# Patient Record
Sex: Female | Born: 1945
Health system: Southern US, Community
[De-identification: ages and names within clinical notes are randomized; demographics above are authoritative.]

## PROBLEM LIST (undated history)

## (undated) DIAGNOSIS — I2510 Atherosclerotic heart disease of native coronary artery without angina pectoris: Secondary | ICD-10-CM

## (undated) DIAGNOSIS — M109 Gout, unspecified: Secondary | ICD-10-CM

## (undated) DIAGNOSIS — M5137 Other intervertebral disc degeneration, lumbosacral region: Secondary | ICD-10-CM

## (undated) DIAGNOSIS — I10 Essential (primary) hypertension: Secondary | ICD-10-CM

## (undated) DIAGNOSIS — I714 Abdominal aortic aneurysm, without rupture, unspecified: Secondary | ICD-10-CM

## (undated) DIAGNOSIS — M51379 Other intervertebral disc degeneration, lumbosacral region without mention of lumbar back pain or lower extremity pain: Secondary | ICD-10-CM

## (undated) DIAGNOSIS — F3289 Other specified depressive episodes: Secondary | ICD-10-CM

## (undated) DIAGNOSIS — Z923 Personal history of irradiation: Secondary | ICD-10-CM

## (undated) DIAGNOSIS — J309 Allergic rhinitis, unspecified: Secondary | ICD-10-CM

## (undated) DIAGNOSIS — E669 Obesity, unspecified: Secondary | ICD-10-CM

## (undated) DIAGNOSIS — G8929 Other chronic pain: Secondary | ICD-10-CM

## (undated) DIAGNOSIS — E039 Hypothyroidism, unspecified: Secondary | ICD-10-CM

## (undated) DIAGNOSIS — K922 Gastrointestinal hemorrhage, unspecified: Secondary | ICD-10-CM

## (undated) DIAGNOSIS — M545 Low back pain, unspecified: Secondary | ICD-10-CM

## (undated) DIAGNOSIS — F329 Major depressive disorder, single episode, unspecified: Secondary | ICD-10-CM

## (undated) DIAGNOSIS — E785 Hyperlipidemia, unspecified: Secondary | ICD-10-CM

## (undated) DIAGNOSIS — F411 Generalized anxiety disorder: Secondary | ICD-10-CM

## (undated) DIAGNOSIS — Z9114 Patient's other noncompliance with medication regimen: Secondary | ICD-10-CM

## (undated) HISTORY — DX: Gout, unspecified: M10.9

## (undated) HISTORY — DX: Other intervertebral disc degeneration, lumbosacral region: M51.37

## (undated) HISTORY — DX: Obesity, unspecified: E66.9

## (undated) HISTORY — DX: Essential (primary) hypertension: I10

## (undated) HISTORY — DX: Other intervertebral disc degeneration, lumbosacral region without mention of lumbar back pain or lower extremity pain: M51.379

## (undated) HISTORY — PX: TOTAL ABDOMINAL HYSTERECTOMY: SHX209

## (undated) HISTORY — DX: Allergic rhinitis, unspecified: J30.9

## (undated) HISTORY — DX: Atherosclerotic heart disease of native coronary artery without angina pectoris: I25.10

## (undated) HISTORY — PX: SPINE SURGERY: SHX786

## (undated) HISTORY — DX: Generalized anxiety disorder: F41.1

## (undated) HISTORY — DX: Low back pain: M54.5

## (undated) HISTORY — PX: OTHER SURGICAL HISTORY: SHX169

## (undated) HISTORY — DX: Low back pain, unspecified: M54.50

## (undated) HISTORY — DX: Other specified depressive episodes: F32.89

## (undated) HISTORY — DX: Major depressive disorder, single episode, unspecified: F32.9

## (undated) HISTORY — PX: PARATHYROIDECTOMY: SHX19

## (undated) HISTORY — PX: OOPHORECTOMY: SHX86

## (undated) HISTORY — DX: Gastrointestinal hemorrhage, unspecified: K92.2

## (undated) HISTORY — DX: Other chronic pain: G89.29

## (undated) HISTORY — PX: THYROIDECTOMY: SHX17

## (undated) HISTORY — DX: Hyperlipidemia, unspecified: E78.5

## (undated) HISTORY — PX: CARDIAC CATHETERIZATION: SHX172

---

## 1998-04-23 ENCOUNTER — Encounter: Payer: Self-pay | Admitting: Cardiology

## 1998-04-23 ENCOUNTER — Observation Stay (HOSPITAL_COMMUNITY): Admission: AD | Admit: 1998-04-23 | Discharge: 1998-04-24 | Payer: Self-pay | Admitting: Cardiology

## 1999-11-09 ENCOUNTER — Other Ambulatory Visit: Admission: RE | Admit: 1999-11-09 | Discharge: 1999-11-09 | Payer: Self-pay | Admitting: Gynecology

## 2001-05-25 ENCOUNTER — Emergency Department (HOSPITAL_COMMUNITY): Admission: EM | Admit: 2001-05-25 | Discharge: 2001-05-25 | Payer: Self-pay | Admitting: Emergency Medicine

## 2001-06-08 ENCOUNTER — Emergency Department (HOSPITAL_COMMUNITY): Admission: EM | Admit: 2001-06-08 | Discharge: 2001-06-08 | Payer: Self-pay | Admitting: Emergency Medicine

## 2001-06-11 ENCOUNTER — Inpatient Hospital Stay (HOSPITAL_COMMUNITY): Admission: EM | Admit: 2001-06-11 | Discharge: 2001-06-15 | Payer: Self-pay | Admitting: *Deleted

## 2001-06-26 ENCOUNTER — Encounter (HOSPITAL_COMMUNITY): Admission: RE | Admit: 2001-06-26 | Discharge: 2001-09-24 | Payer: Self-pay | Admitting: Cardiology

## 2001-08-23 ENCOUNTER — Other Ambulatory Visit: Admission: RE | Admit: 2001-08-23 | Discharge: 2001-08-23 | Payer: Self-pay | Admitting: Obstetrics and Gynecology

## 2002-06-15 ENCOUNTER — Emergency Department (HOSPITAL_COMMUNITY): Admission: EM | Admit: 2002-06-15 | Discharge: 2002-06-15 | Payer: Self-pay | Admitting: Emergency Medicine

## 2002-07-10 ENCOUNTER — Encounter: Payer: Self-pay | Admitting: Internal Medicine

## 2002-07-10 ENCOUNTER — Ambulatory Visit (HOSPITAL_COMMUNITY): Admission: RE | Admit: 2002-07-10 | Discharge: 2002-07-10 | Payer: Self-pay | Admitting: Internal Medicine

## 2002-12-11 ENCOUNTER — Emergency Department (HOSPITAL_COMMUNITY): Admission: EM | Admit: 2002-12-11 | Discharge: 2002-12-12 | Payer: Self-pay | Admitting: Emergency Medicine

## 2003-06-26 ENCOUNTER — Ambulatory Visit (HOSPITAL_COMMUNITY): Admission: RE | Admit: 2003-06-26 | Discharge: 2003-06-26 | Payer: Self-pay | Admitting: Cardiology

## 2003-12-15 ENCOUNTER — Ambulatory Visit: Payer: Self-pay | Admitting: Internal Medicine

## 2003-12-25 ENCOUNTER — Ambulatory Visit: Payer: Self-pay | Admitting: Internal Medicine

## 2004-02-07 ENCOUNTER — Inpatient Hospital Stay (HOSPITAL_COMMUNITY): Admission: EM | Admit: 2004-02-07 | Discharge: 2004-02-11 | Payer: Self-pay | Admitting: Emergency Medicine

## 2004-02-07 ENCOUNTER — Ambulatory Visit: Payer: Self-pay | Admitting: Cardiology

## 2004-02-16 ENCOUNTER — Ambulatory Visit: Payer: Self-pay | Admitting: Cardiology

## 2004-03-22 ENCOUNTER — Encounter (HOSPITAL_COMMUNITY): Admission: RE | Admit: 2004-03-22 | Discharge: 2004-05-17 | Payer: Self-pay | Admitting: Cardiology

## 2004-03-30 ENCOUNTER — Ambulatory Visit: Payer: Self-pay | Admitting: Internal Medicine

## 2004-03-31 ENCOUNTER — Ambulatory Visit: Payer: Self-pay | Admitting: Internal Medicine

## 2004-04-16 ENCOUNTER — Ambulatory Visit: Payer: Self-pay | Admitting: Cardiology

## 2004-04-16 ENCOUNTER — Ambulatory Visit (HOSPITAL_COMMUNITY): Admission: RE | Admit: 2004-04-16 | Discharge: 2004-04-16 | Payer: Self-pay | Admitting: Internal Medicine

## 2004-04-22 ENCOUNTER — Ambulatory Visit: Payer: Self-pay | Admitting: Internal Medicine

## 2004-05-14 ENCOUNTER — Ambulatory Visit: Payer: Self-pay | Admitting: Internal Medicine

## 2004-06-29 ENCOUNTER — Ambulatory Visit: Payer: Self-pay | Admitting: Internal Medicine

## 2004-07-16 ENCOUNTER — Ambulatory Visit: Payer: Self-pay | Admitting: Cardiology

## 2004-07-19 ENCOUNTER — Encounter: Payer: Self-pay | Admitting: Internal Medicine

## 2004-07-24 ENCOUNTER — Ambulatory Visit: Payer: Self-pay | Admitting: Family Medicine

## 2004-08-09 ENCOUNTER — Ambulatory Visit: Payer: Self-pay | Admitting: Internal Medicine

## 2004-08-17 ENCOUNTER — Encounter: Admission: RE | Admit: 2004-08-17 | Discharge: 2004-08-17 | Payer: Self-pay | Admitting: Cardiology

## 2004-09-28 ENCOUNTER — Ambulatory Visit: Payer: Self-pay | Admitting: Internal Medicine

## 2004-10-15 ENCOUNTER — Ambulatory Visit: Payer: Self-pay | Admitting: Cardiology

## 2004-10-21 ENCOUNTER — Ambulatory Visit: Payer: Self-pay | Admitting: Internal Medicine

## 2004-10-22 ENCOUNTER — Other Ambulatory Visit: Admission: RE | Admit: 2004-10-22 | Discharge: 2004-10-22 | Payer: Self-pay | Admitting: Gynecology

## 2004-11-23 ENCOUNTER — Ambulatory Visit: Payer: Self-pay | Admitting: Internal Medicine

## 2004-11-29 ENCOUNTER — Ambulatory Visit (HOSPITAL_COMMUNITY): Admission: RE | Admit: 2004-11-29 | Discharge: 2004-11-29 | Payer: Self-pay | Admitting: Otolaryngology

## 2004-12-21 ENCOUNTER — Ambulatory Visit: Payer: Self-pay | Admitting: Internal Medicine

## 2004-12-25 ENCOUNTER — Emergency Department (HOSPITAL_COMMUNITY): Admission: EM | Admit: 2004-12-25 | Discharge: 2004-12-25 | Payer: Self-pay | Admitting: Emergency Medicine

## 2005-01-03 ENCOUNTER — Encounter: Admission: RE | Admit: 2005-01-03 | Discharge: 2005-01-03 | Payer: Self-pay | Admitting: Dentistry

## 2005-02-22 ENCOUNTER — Encounter: Admission: RE | Admit: 2005-02-22 | Discharge: 2005-05-23 | Payer: Self-pay | Admitting: Cardiology

## 2005-03-23 ENCOUNTER — Ambulatory Visit: Payer: Self-pay | Admitting: Cardiology

## 2005-03-25 ENCOUNTER — Ambulatory Visit: Payer: Self-pay | Admitting: Cardiology

## 2005-03-29 ENCOUNTER — Ambulatory Visit: Payer: Self-pay | Admitting: Internal Medicine

## 2005-05-06 ENCOUNTER — Ambulatory Visit: Payer: Self-pay | Admitting: Internal Medicine

## 2005-05-25 ENCOUNTER — Ambulatory Visit: Payer: Self-pay | Admitting: Internal Medicine

## 2005-07-19 ENCOUNTER — Ambulatory Visit: Payer: Self-pay | Admitting: Internal Medicine

## 2005-07-19 ENCOUNTER — Ambulatory Visit: Payer: Self-pay | Admitting: Cardiology

## 2005-08-22 ENCOUNTER — Ambulatory Visit: Payer: Self-pay | Admitting: Internal Medicine

## 2005-10-04 ENCOUNTER — Ambulatory Visit: Payer: Self-pay | Admitting: Internal Medicine

## 2005-10-30 ENCOUNTER — Emergency Department (HOSPITAL_COMMUNITY): Admission: EM | Admit: 2005-10-30 | Discharge: 2005-10-30 | Payer: Self-pay | Admitting: Emergency Medicine

## 2005-10-31 ENCOUNTER — Ambulatory Visit: Payer: Self-pay | Admitting: Internal Medicine

## 2005-11-10 ENCOUNTER — Ambulatory Visit: Payer: Self-pay | Admitting: Cardiology

## 2005-11-15 ENCOUNTER — Ambulatory Visit: Payer: Self-pay

## 2005-11-23 ENCOUNTER — Ambulatory Visit: Payer: Self-pay | Admitting: Internal Medicine

## 2005-11-28 ENCOUNTER — Ambulatory Visit: Payer: Self-pay | Admitting: Cardiology

## 2005-12-01 ENCOUNTER — Ambulatory Visit: Payer: Self-pay | Admitting: Cardiology

## 2005-12-01 ENCOUNTER — Ambulatory Visit (HOSPITAL_COMMUNITY): Admission: RE | Admit: 2005-12-01 | Discharge: 2005-12-02 | Payer: Self-pay | Admitting: Cardiology

## 2005-12-07 ENCOUNTER — Ambulatory Visit: Payer: Self-pay | Admitting: Internal Medicine

## 2005-12-07 LAB — CONVERTED CEMR LAB
ALT: 43 units/L — ABNORMAL HIGH (ref 0–40)
AST: 54 units/L — ABNORMAL HIGH (ref 0–37)
Albumin: 3.9 g/dL (ref 3.5–5.2)
Alkaline Phosphatase: 95 units/L (ref 39–117)
BUN: 14 mg/dL (ref 6–23)
Basophils Absolute: 0 10*3/uL (ref 0.0–0.1)
CO2: 25 meq/L (ref 19–32)
Chloride: 106 meq/L (ref 96–112)
Chol/HDL Ratio, serum: 3.8
Cholesterol: 126 mg/dL (ref 0–200)
Creatinine, Ser: 0.9 mg/dL (ref 0.4–1.2)
Crystals: NEGATIVE
Eosinophil percent: 4.4 % (ref 0.0–5.0)
GFR calc non Af Amer: 68 mL/min
Glucose, Bld: 213 mg/dL — ABNORMAL HIGH (ref 70–99)
HCT: 46.5 % — ABNORMAL HIGH (ref 36.0–46.0)
Hemoglobin: 15.2 g/dL — ABNORMAL HIGH (ref 12.0–15.0)
Hgb A1c MFr Bld: 7.7 % — ABNORMAL HIGH (ref 4.6–6.0)
MCHC: 32.7 g/dL (ref 30.0–36.0)
MCV: 85.3 fL (ref 78.0–100.0)
Monocytes Absolute: 0.4 10*3/uL (ref 0.2–0.7)
Monocytes Relative: 7 % (ref 3.0–11.0)
Mucus, UA: NEGATIVE
Neutro Abs: 3.5 10*3/uL (ref 1.4–7.7)
Neutrophils Relative %: 53.8 % (ref 43.0–77.0)
Potassium: 4.2 meq/L (ref 3.5–5.1)
RBC: 5.45 M/uL — ABNORMAL HIGH (ref 3.87–5.11)
RDW: 12.8 % (ref 11.5–14.6)
Sodium: 140 meq/L (ref 135–145)
Specific Gravity, Urine: 1.025 (ref 1.000–1.03)
TSH: 1.64 microintl units/mL (ref 0.35–5.50)
Total Bilirubin: 0.9 mg/dL (ref 0.3–1.2)
Total Protein: 7.5 g/dL (ref 6.0–8.3)
Triglyceride fasting, serum: 126 mg/dL (ref 0–149)
Urine Glucose: NEGATIVE mg/dL
Urobilinogen, UA: 0.2 (ref 0.0–1.0)
VLDL: 25 mg/dL (ref 0–40)
pH: 5.5 (ref 5.0–8.0)

## 2005-12-16 ENCOUNTER — Ambulatory Visit: Payer: Self-pay | Admitting: Cardiology

## 2005-12-27 ENCOUNTER — Ambulatory Visit: Payer: Self-pay | Admitting: Internal Medicine

## 2006-01-02 ENCOUNTER — Ambulatory Visit: Payer: Self-pay | Admitting: Internal Medicine

## 2006-01-02 ENCOUNTER — Ambulatory Visit: Payer: Self-pay | Admitting: Gastroenterology

## 2006-01-02 LAB — CONVERTED CEMR LAB
ALT: 37 units/L (ref 0–40)
AST: 39 units/L — ABNORMAL HIGH (ref 0–37)
Albumin: 3.8 g/dL (ref 3.5–5.2)
Alkaline Phosphatase: 89 units/L (ref 39–117)
BUN: 8 mg/dL (ref 6–23)
Basophils Absolute: 0.1 10*3/uL (ref 0.0–0.1)
Basophils Relative: 2 % — ABNORMAL HIGH (ref 0.0–1.0)
Bilirubin Urine: NEGATIVE
CO2: 26 meq/L (ref 19–32)
Chloride: 106 meq/L (ref 96–112)
Chol/HDL Ratio, serum: 3.3
Creatinine, Ser: 0.9 mg/dL (ref 0.4–1.2)
Eosinophil percent: 2.8 % (ref 0.0–5.0)
GFR calc non Af Amer: 68 mL/min
HCT: 45.9 % (ref 36.0–46.0)
LDL Cholesterol: 66 mg/dL (ref 0–99)
Leukocytes, UA: NEGATIVE
Lymphocytes Relative: 35.7 % (ref 12.0–46.0)
MCHC: 32.4 g/dL (ref 30.0–36.0)
MCV: 86.7 fL (ref 78.0–100.0)
Monocytes Absolute: 0.4 10*3/uL (ref 0.2–0.7)
Neutro Abs: 3.4 10*3/uL (ref 1.4–7.7)
Neutrophils Relative %: 53.9 % (ref 43.0–77.0)
Nitrite: NEGATIVE
Platelets: 354 10*3/uL (ref 150–400)
Potassium: 4 meq/L (ref 3.5–5.1)
RBC: 5.3 M/uL — ABNORMAL HIGH (ref 3.87–5.11)
RDW: 12.9 % (ref 11.5–14.6)
Sodium: 141 meq/L (ref 135–145)
Specific Gravity, Urine: 1.025 (ref 1.000–1.03)
Total Bilirubin: 0.8 mg/dL (ref 0.3–1.2)
Total Protein, Urine: NEGATIVE mg/dL
Total Protein: 7.7 g/dL (ref 6.0–8.3)
Triglyceride fasting, serum: 102 mg/dL (ref 0–149)
Urobilinogen, UA: 0.2 (ref 0.0–1.0)
WBC: 6.3 10*3/uL (ref 4.5–10.5)
pH: 5.5 (ref 5.0–8.0)

## 2006-03-24 ENCOUNTER — Ambulatory Visit: Payer: Self-pay | Admitting: Internal Medicine

## 2006-04-17 ENCOUNTER — Ambulatory Visit: Payer: Self-pay | Admitting: Internal Medicine

## 2006-05-03 ENCOUNTER — Ambulatory Visit: Payer: Self-pay | Admitting: Gastroenterology

## 2006-05-10 ENCOUNTER — Ambulatory Visit: Payer: Self-pay | Admitting: Cardiology

## 2006-05-19 ENCOUNTER — Ambulatory Visit (HOSPITAL_COMMUNITY): Admission: RE | Admit: 2006-05-19 | Discharge: 2006-05-19 | Payer: Self-pay | Admitting: Gastroenterology

## 2006-05-19 LAB — HM COLONOSCOPY

## 2006-05-30 ENCOUNTER — Ambulatory Visit: Payer: Self-pay | Admitting: Gastroenterology

## 2006-06-22 ENCOUNTER — Ambulatory Visit: Payer: Self-pay | Admitting: Internal Medicine

## 2006-06-22 LAB — CONVERTED CEMR LAB
BUN: 9 mg/dL (ref 6–23)
CO2: 27 meq/L (ref 19–32)
Calcium: 9.2 mg/dL (ref 8.4–10.5)
Chloride: 106 meq/L (ref 96–112)
Cholesterol: 161 mg/dL (ref 0–200)
Creatinine, Ser: 0.9 mg/dL (ref 0.4–1.2)
GFR calc Af Amer: 82 mL/min
GFR calc non Af Amer: 68 mL/min
Glucose, Bld: 116 mg/dL — ABNORMAL HIGH (ref 70–99)
HDL: 31.9 mg/dL — ABNORMAL LOW (ref >39.0)
Hgb A1c MFr Bld: 8.1 % — ABNORMAL HIGH (ref 4.6–6.0)
LDL Cholesterol: 98 mg/dL (ref 0–99)
Potassium: 3.9 meq/L (ref 3.5–5.1)
Sodium: 142 meq/L (ref 135–145)
Total CHOL/HDL Ratio: 5
Triglycerides: 157 mg/dL — ABNORMAL HIGH (ref 0–149)
VLDL: 31 mg/dL (ref 0–40)

## 2006-06-30 ENCOUNTER — Ambulatory Visit: Payer: Self-pay | Admitting: Internal Medicine

## 2006-07-31 ENCOUNTER — Encounter: Payer: Self-pay | Admitting: Internal Medicine

## 2006-07-31 DIAGNOSIS — I2510 Atherosclerotic heart disease of native coronary artery without angina pectoris: Secondary | ICD-10-CM

## 2006-07-31 DIAGNOSIS — I10 Essential (primary) hypertension: Secondary | ICD-10-CM | POA: Insufficient documentation

## 2006-07-31 DIAGNOSIS — E782 Mixed hyperlipidemia: Secondary | ICD-10-CM | POA: Insufficient documentation

## 2006-07-31 DIAGNOSIS — E785 Hyperlipidemia, unspecified: Secondary | ICD-10-CM | POA: Insufficient documentation

## 2006-07-31 DIAGNOSIS — E039 Hypothyroidism, unspecified: Secondary | ICD-10-CM | POA: Insufficient documentation

## 2006-07-31 HISTORY — DX: Atherosclerotic heart disease of native coronary artery without angina pectoris: I25.10

## 2006-07-31 HISTORY — DX: Hyperlipidemia, unspecified: E78.5

## 2006-08-28 ENCOUNTER — Ambulatory Visit: Payer: Self-pay | Admitting: Cardiology

## 2006-09-20 ENCOUNTER — Ambulatory Visit: Payer: Self-pay | Admitting: Internal Medicine

## 2006-10-14 ENCOUNTER — Ambulatory Visit: Payer: Self-pay | Admitting: Family Medicine

## 2006-10-18 ENCOUNTER — Ambulatory Visit: Payer: Self-pay | Admitting: Internal Medicine

## 2006-10-18 LAB — CONVERTED CEMR LAB: Hgb A1c MFr Bld: 7 % — ABNORMAL HIGH (ref 4.6–6.0)

## 2006-10-23 ENCOUNTER — Ambulatory Visit: Payer: Self-pay | Admitting: Cardiology

## 2006-10-24 ENCOUNTER — Ambulatory Visit: Payer: Self-pay | Admitting: Cardiology

## 2006-10-24 LAB — CONVERTED CEMR LAB
ALT: 34 units/L (ref 0–35)
AST: 27 units/L (ref 0–37)
Bilirubin, Direct: 0.1 mg/dL (ref 0.0–0.3)
Cholesterol: 111 mg/dL (ref 0–200)
HDL: 31.6 mg/dL — ABNORMAL LOW (ref >39.0)
LDL Cholesterol: 66 mg/dL (ref 0–99)
Total Bilirubin: 0.5 mg/dL (ref 0.3–1.2)
Total CHOL/HDL Ratio: 3.5
Total Protein: 7.4 g/dL (ref 6.0–8.3)
Triglycerides: 66 mg/dL (ref 0–149)
VLDL: 13 mg/dL (ref 0–40)

## 2006-11-03 ENCOUNTER — Ambulatory Visit: Payer: Self-pay | Admitting: Internal Medicine

## 2006-11-03 ENCOUNTER — Encounter: Payer: Self-pay | Admitting: Internal Medicine

## 2006-11-03 DIAGNOSIS — J309 Allergic rhinitis, unspecified: Secondary | ICD-10-CM | POA: Insufficient documentation

## 2006-11-03 DIAGNOSIS — E119 Type 2 diabetes mellitus without complications: Secondary | ICD-10-CM

## 2006-11-03 HISTORY — DX: Type 2 diabetes mellitus without complications: E11.9

## 2006-11-04 ENCOUNTER — Emergency Department (HOSPITAL_COMMUNITY): Admission: EM | Admit: 2006-11-04 | Discharge: 2006-11-04 | Payer: Self-pay | Admitting: Emergency Medicine

## 2007-01-18 HISTORY — PX: LUMBAR FUSION: SHX111

## 2007-02-07 ENCOUNTER — Ambulatory Visit: Payer: Self-pay | Admitting: Internal Medicine

## 2007-02-07 DIAGNOSIS — M5137 Other intervertebral disc degeneration, lumbosacral region: Secondary | ICD-10-CM | POA: Insufficient documentation

## 2007-02-07 DIAGNOSIS — K5900 Constipation, unspecified: Secondary | ICD-10-CM | POA: Insufficient documentation

## 2007-02-07 DIAGNOSIS — R300 Dysuria: Secondary | ICD-10-CM | POA: Insufficient documentation

## 2007-02-07 DIAGNOSIS — R1013 Epigastric pain: Secondary | ICD-10-CM

## 2007-02-07 DIAGNOSIS — M51379 Other intervertebral disc degeneration, lumbosacral region without mention of lumbar back pain or lower extremity pain: Secondary | ICD-10-CM | POA: Insufficient documentation

## 2007-02-07 DIAGNOSIS — K3189 Other diseases of stomach and duodenum: Secondary | ICD-10-CM | POA: Insufficient documentation

## 2007-02-07 LAB — CONVERTED CEMR LAB
Crystals: NEGATIVE
Hemoglobin, Urine: NEGATIVE
Mucus, UA: NEGATIVE
Nitrite: NEGATIVE
Specific Gravity, Urine: 1.02 (ref 1.000–1.03)
Total Protein, Urine: NEGATIVE mg/dL
Urine Glucose: NEGATIVE mg/dL
pH: 6 (ref 5.0–8.0)

## 2007-04-23 ENCOUNTER — Ambulatory Visit: Payer: Self-pay | Admitting: Internal Medicine

## 2007-04-23 DIAGNOSIS — H6691 Otitis media, unspecified, right ear: Secondary | ICD-10-CM | POA: Insufficient documentation

## 2007-04-23 DIAGNOSIS — H6520 Chronic serous otitis media, unspecified ear: Secondary | ICD-10-CM | POA: Insufficient documentation

## 2007-04-23 HISTORY — DX: Chronic serous otitis media, unspecified ear: H65.20

## 2007-04-23 LAB — CONVERTED CEMR LAB
ALT: 37 units/L — ABNORMAL HIGH (ref 0–35)
AST: 34 units/L (ref 0–37)
Albumin: 4.1 g/dL (ref 3.5–5.2)
Alkaline Phosphatase: 92 units/L (ref 39–117)
BUN: 6 mg/dL (ref 6–23)
Bilirubin, Direct: 0.1 mg/dL (ref 0.0–0.3)
CO2: 29 meq/L (ref 19–32)
Chloride: 101 meq/L (ref 96–112)
Cholesterol: 119 mg/dL (ref 0–200)
Creatinine, Ser: 0.7 mg/dL (ref 0.4–1.2)
GFR calc Af Amer: 109 mL/min
GFR calc non Af Amer: 90 mL/min
Hgb A1c MFr Bld: 6.6 % — ABNORMAL HIGH (ref 4.6–6.0)
LDL Cholesterol: 60 mg/dL (ref 0–99)
Potassium: 3.8 meq/L (ref 3.5–5.1)
Sodium: 139 meq/L (ref 135–145)
Total Bilirubin: 0.8 mg/dL (ref 0.3–1.2)
Triglycerides: 146 mg/dL (ref 0–149)
VLDL: 29 mg/dL (ref 0–40)

## 2007-04-25 ENCOUNTER — Encounter: Payer: Self-pay | Admitting: Internal Medicine

## 2007-05-22 ENCOUNTER — Ambulatory Visit: Payer: Self-pay | Admitting: Cardiology

## 2007-06-22 ENCOUNTER — Ambulatory Visit: Payer: Self-pay | Admitting: Internal Medicine

## 2007-06-22 DIAGNOSIS — F329 Major depressive disorder, single episode, unspecified: Secondary | ICD-10-CM

## 2007-06-22 DIAGNOSIS — R350 Frequency of micturition: Secondary | ICD-10-CM | POA: Insufficient documentation

## 2007-06-22 DIAGNOSIS — F3289 Other specified depressive episodes: Secondary | ICD-10-CM | POA: Insufficient documentation

## 2007-06-22 DIAGNOSIS — F411 Generalized anxiety disorder: Secondary | ICD-10-CM | POA: Insufficient documentation

## 2007-06-22 LAB — CONVERTED CEMR LAB
BUN: 11 mg/dL (ref 6–23)
Bilirubin Urine: NEGATIVE
Blood in Urine, dipstick: NEGATIVE
CO2: 28 meq/L (ref 19–32)
Calcium: 9.1 mg/dL (ref 8.4–10.5)
Chloride: 99 meq/L (ref 96–112)
Cholesterol: 117 mg/dL (ref 0–200)
Creatinine, Ser: 0.8 mg/dL (ref 0.4–1.2)
GFR calc Af Amer: 94 mL/min
GFR calc non Af Amer: 78 mL/min
Glucose, Bld: 223 mg/dL — ABNORMAL HIGH (ref 70–99)
Glucose, Urine, Semiquant: NEGATIVE
HDL: 33.9 mg/dL — ABNORMAL LOW (ref >39.0)
Hgb A1c MFr Bld: 6.7 % — ABNORMAL HIGH (ref 4.6–6.0)
Ketones, urine, test strip: NEGATIVE
LDL Cholesterol: 60 mg/dL (ref 0–99)
Protein, U semiquant: NEGATIVE
Total CHOL/HDL Ratio: 3.5
Triglycerides: 114 mg/dL (ref 0–149)
Urobilinogen, UA: NEGATIVE
VLDL: 23 mg/dL (ref 0–40)
WBC Urine, dipstick: NEGATIVE
pH: 6.5

## 2007-08-02 ENCOUNTER — Ambulatory Visit: Payer: Self-pay | Admitting: Internal Medicine

## 2007-08-02 ENCOUNTER — Telehealth: Payer: Self-pay | Admitting: Internal Medicine

## 2007-09-28 ENCOUNTER — Telehealth (INDEPENDENT_AMBULATORY_CARE_PROVIDER_SITE_OTHER): Payer: Self-pay | Admitting: *Deleted

## 2007-09-28 ENCOUNTER — Ambulatory Visit: Payer: Self-pay | Admitting: Internal Medicine

## 2007-09-28 DIAGNOSIS — J209 Acute bronchitis, unspecified: Secondary | ICD-10-CM | POA: Insufficient documentation

## 2007-10-15 ENCOUNTER — Ambulatory Visit: Payer: Self-pay | Admitting: Internal Medicine

## 2007-10-26 ENCOUNTER — Ambulatory Visit: Payer: Self-pay | Admitting: Internal Medicine

## 2007-10-26 LAB — CONVERTED CEMR LAB
ALT: 36 units/L — ABNORMAL HIGH (ref 0–35)
AST: 37 units/L (ref 0–37)
Albumin: 3.8 g/dL (ref 3.5–5.2)
Alkaline Phosphatase: 93 units/L (ref 39–117)
BUN: 8 mg/dL (ref 6–23)
Basophils Absolute: 0 10*3/uL (ref 0.0–0.1)
Basophils Relative: 0.3 % (ref 0.0–3.0)
Bilirubin, Direct: 0.2 mg/dL (ref 0.0–0.3)
CO2: 27 meq/L (ref 19–32)
Calcium: 9 mg/dL (ref 8.4–10.5)
Chloride: 105 meq/L (ref 96–112)
Cholesterol: 124 mg/dL (ref 0–200)
Creatinine, Ser: 0.8 mg/dL (ref 0.4–1.2)
Creatinine,U: 194.9 mg/dL
Crystals: NEGATIVE
Eosinophils Absolute: 0.2 10*3/uL (ref 0.0–0.7)
Eosinophils Relative: 2.8 % (ref 0.0–5.0)
GFR calc Af Amer: 93 mL/min
GFR calc non Af Amer: 77 mL/min
Glucose, Bld: 146 mg/dL — ABNORMAL HIGH (ref 70–99)
HCT: 43.4 % (ref 36.0–46.0)
HDL: 32.6 mg/dL — ABNORMAL LOW (ref >39.0)
Hemoglobin: 14.9 g/dL (ref 12.0–15.0)
Hgb A1c MFr Bld: 6.8 % — ABNORMAL HIGH (ref 4.6–6.0)
Ketones, ur: NEGATIVE mg/dL
LDL Cholesterol: 68 mg/dL (ref 0–99)
Leukocytes, UA: NEGATIVE
Lymphocytes Relative: 38.1 % (ref 12.0–46.0)
MCHC: 34.2 g/dL (ref 30.0–36.0)
MCV: 85 fL (ref 78.0–100.0)
Microalb Creat Ratio: 11.8 mg/g (ref 0.0–30.0)
Microalb, Ur: 2.3 mg/dL — ABNORMAL HIGH (ref 0.0–1.9)
Monocytes Absolute: 0.6 10*3/uL (ref 0.1–1.0)
Monocytes Relative: 8 % (ref 3.0–12.0)
Mucus, UA: NEGATIVE
Neutro Abs: 3.6 10*3/uL (ref 1.4–7.7)
Neutrophils Relative %: 50.8 % (ref 43.0–77.0)
Nitrite: NEGATIVE
Platelets: 326 10*3/uL (ref 150–400)
Potassium: 4.3 meq/L (ref 3.5–5.1)
RBC: 5.11 M/uL (ref 3.87–5.11)
RDW: 13.1 % (ref 11.5–14.6)
Sodium: 141 meq/L (ref 135–145)
Specific Gravity, Urine: 1.02 (ref 1.000–1.03)
TSH: 1.57 microintl units/mL (ref 0.35–5.50)
Total Bilirubin: 0.6 mg/dL (ref 0.3–1.2)
Total CHOL/HDL Ratio: 3.8
Total Protein, Urine: NEGATIVE mg/dL
Total Protein: 7.7 g/dL (ref 6.0–8.3)
Triglycerides: 115 mg/dL (ref 0–149)
Urine Glucose: NEGATIVE mg/dL
Urobilinogen, UA: 0.2 (ref 0.0–1.0)
VLDL: 23 mg/dL (ref 0–40)
WBC: 7.1 10*3/uL (ref 4.5–10.5)
pH: 5.5 (ref 5.0–8.0)

## 2007-10-29 ENCOUNTER — Ambulatory Visit: Payer: Self-pay | Admitting: Internal Medicine

## 2007-10-29 DIAGNOSIS — N959 Unspecified menopausal and perimenopausal disorder: Secondary | ICD-10-CM

## 2007-10-29 HISTORY — DX: Unspecified menopausal and perimenopausal disorder: N95.9

## 2007-12-03 ENCOUNTER — Ambulatory Visit: Payer: Self-pay | Admitting: Internal Medicine

## 2007-12-06 ENCOUNTER — Telehealth (INDEPENDENT_AMBULATORY_CARE_PROVIDER_SITE_OTHER): Payer: Self-pay | Admitting: *Deleted

## 2007-12-12 ENCOUNTER — Ambulatory Visit: Payer: Self-pay | Admitting: Cardiology

## 2007-12-27 ENCOUNTER — Telehealth (INDEPENDENT_AMBULATORY_CARE_PROVIDER_SITE_OTHER): Payer: Self-pay | Admitting: *Deleted

## 2008-01-23 ENCOUNTER — Encounter: Payer: Self-pay | Admitting: Internal Medicine

## 2008-03-03 ENCOUNTER — Ambulatory Visit: Payer: Self-pay | Admitting: Internal Medicine

## 2008-06-25 ENCOUNTER — Telehealth (INDEPENDENT_AMBULATORY_CARE_PROVIDER_SITE_OTHER): Payer: Self-pay | Admitting: *Deleted

## 2008-06-30 ENCOUNTER — Ambulatory Visit: Payer: Self-pay | Admitting: Internal Medicine

## 2008-06-30 DIAGNOSIS — J019 Acute sinusitis, unspecified: Secondary | ICD-10-CM | POA: Insufficient documentation

## 2008-07-07 ENCOUNTER — Telehealth (INDEPENDENT_AMBULATORY_CARE_PROVIDER_SITE_OTHER): Payer: Self-pay | Admitting: *Deleted

## 2008-10-23 ENCOUNTER — Ambulatory Visit: Payer: Self-pay | Admitting: Internal Medicine

## 2008-10-23 LAB — CONVERTED CEMR LAB
ALT: 41 units/L — ABNORMAL HIGH (ref 0–35)
AST: 38 units/L — ABNORMAL HIGH (ref 0–37)
Albumin: 3.8 g/dL (ref 3.5–5.2)
Alkaline Phosphatase: 78 units/L (ref 39–117)
BUN: 8 mg/dL (ref 6–23)
Basophils Absolute: 0.2 10*3/uL — ABNORMAL HIGH (ref 0.0–0.1)
Basophils Relative: 2.3 % (ref 0.0–3.0)
Bilirubin Urine: NEGATIVE
Bilirubin, Direct: 0.1 mg/dL (ref 0.0–0.3)
Calcium: 9.1 mg/dL (ref 8.4–10.5)
Chloride: 105 meq/L (ref 96–112)
Cholesterol: 173 mg/dL (ref 0–200)
Creatinine, Ser: 0.9 mg/dL (ref 0.4–1.2)
Eosinophils Absolute: 0.2 10*3/uL (ref 0.0–0.7)
GFR calc non Af Amer: 81.28 mL/min (ref >60)
Glucose, Bld: 129 mg/dL — ABNORMAL HIGH (ref 70–99)
HCT: 46 % (ref 36.0–46.0)
Hemoglobin: 15.1 g/dL — ABNORMAL HIGH (ref 12.0–15.0)
Hgb A1c MFr Bld: 6.6 % — ABNORMAL HIGH (ref 4.6–6.5)
Ketones, ur: NEGATIVE mg/dL
LDL Cholesterol: 120 mg/dL — ABNORMAL HIGH (ref 0–99)
Leukocytes, UA: NEGATIVE
Lymphocytes Relative: 30.9 % (ref 12.0–46.0)
Lymphs Abs: 2.7 10*3/uL (ref 0.7–4.0)
MCHC: 32.9 g/dL (ref 30.0–36.0)
MCV: 88.8 fL (ref 78.0–100.0)
Monocytes Absolute: 0.6 10*3/uL (ref 0.1–1.0)
Monocytes Relative: 7.1 % (ref 3.0–12.0)
Neutro Abs: 4.9 10*3/uL (ref 1.4–7.7)
Neutrophils Relative %: 57.3 % (ref 43.0–77.0)
Nitrite: NEGATIVE
Potassium: 4 meq/L (ref 3.5–5.1)
RBC: 5.19 M/uL — ABNORMAL HIGH (ref 3.87–5.11)
RDW: 12.9 % (ref 11.5–14.6)
Sodium: 136 meq/L (ref 135–145)
Specific Gravity, Urine: 1.015 (ref 1.000–1.030)
TSH: 2.31 microintl units/mL (ref 0.35–5.50)
Total Bilirubin: 0.5 mg/dL (ref 0.3–1.2)
Total CHOL/HDL Ratio: 6
Total Protein, Urine: NEGATIVE mg/dL
Triglycerides: 126 mg/dL (ref 0.0–149.0)
Urine Glucose: NEGATIVE mg/dL
Urobilinogen, UA: 0.2 (ref 0.0–1.0)
VLDL: 25.2 mg/dL (ref 0.0–40.0)
WBC: 8.6 10*3/uL (ref 4.5–10.5)
pH: 5.5 (ref 5.0–8.0)

## 2008-10-31 ENCOUNTER — Ambulatory Visit: Payer: Self-pay | Admitting: Internal Medicine

## 2008-11-17 DIAGNOSIS — E669 Obesity, unspecified: Secondary | ICD-10-CM | POA: Insufficient documentation

## 2008-11-18 ENCOUNTER — Ambulatory Visit: Payer: Self-pay | Admitting: Cardiology

## 2008-11-18 DIAGNOSIS — F17200 Nicotine dependence, unspecified, uncomplicated: Secondary | ICD-10-CM | POA: Insufficient documentation

## 2008-11-18 DIAGNOSIS — Z87891 Personal history of nicotine dependence: Secondary | ICD-10-CM | POA: Insufficient documentation

## 2008-12-01 ENCOUNTER — Ambulatory Visit: Payer: Self-pay | Admitting: Cardiology

## 2008-12-19 ENCOUNTER — Ambulatory Visit: Payer: Self-pay | Admitting: Internal Medicine

## 2008-12-19 DIAGNOSIS — R062 Wheezing: Secondary | ICD-10-CM | POA: Insufficient documentation

## 2008-12-26 ENCOUNTER — Telehealth: Payer: Self-pay | Admitting: Cardiology

## 2008-12-26 LAB — CONVERTED CEMR LAB
AST: 39 units/L — ABNORMAL HIGH (ref 0–37)
Albumin: 3.4 g/dL — ABNORMAL LOW (ref 3.5–5.2)
Alkaline Phosphatase: 74 units/L (ref 39–117)
Bilirubin, Direct: 0.1 mg/dL (ref 0.0–0.3)
Cholesterol: 169 mg/dL (ref 0–200)
HDL: 28.7 mg/dL — ABNORMAL LOW (ref >39.00)
LDL Cholesterol: 120 mg/dL — ABNORMAL HIGH (ref 0–99)
Total Bilirubin: 1 mg/dL (ref 0.3–1.2)
Total CHOL/HDL Ratio: 6
Total Protein: 6.9 g/dL (ref 6.0–8.3)
Triglycerides: 102 mg/dL (ref 0.0–149.0)
VLDL: 20.4 mg/dL (ref 0.0–40.0)

## 2009-01-07 ENCOUNTER — Telehealth: Payer: Self-pay | Admitting: Internal Medicine

## 2009-04-24 ENCOUNTER — Ambulatory Visit: Payer: Self-pay | Admitting: Internal Medicine

## 2009-04-24 LAB — CONVERTED CEMR LAB
BUN: 8 mg/dL (ref 6–23)
CO2: 31 meq/L (ref 19–32)
Calcium: 8.7 mg/dL (ref 8.4–10.5)
Chloride: 104 meq/L (ref 96–112)
Cholesterol: 161 mg/dL (ref 0–200)
Creatinine, Ser: 0.7 mg/dL (ref 0.4–1.2)
GFR calc non Af Amer: 108.45 mL/min (ref >60)
Glucose, Bld: 154 mg/dL — ABNORMAL HIGH (ref 70–99)
HDL: 33.1 mg/dL — ABNORMAL LOW (ref >39.00)
Hgb A1c MFr Bld: 7.1 % — ABNORMAL HIGH (ref 4.6–6.5)
LDL Cholesterol: 94 mg/dL (ref 0–99)
Potassium: 4 meq/L (ref 3.5–5.1)
Sodium: 141 meq/L (ref 135–145)
Total CHOL/HDL Ratio: 5
Triglycerides: 171 mg/dL — ABNORMAL HIGH (ref 0.0–149.0)
VLDL: 34.2 mg/dL (ref 0.0–40.0)

## 2009-05-01 ENCOUNTER — Ambulatory Visit: Payer: Self-pay | Admitting: Internal Medicine

## 2009-05-11 ENCOUNTER — Telehealth (INDEPENDENT_AMBULATORY_CARE_PROVIDER_SITE_OTHER): Payer: Self-pay | Admitting: *Deleted

## 2009-05-19 ENCOUNTER — Ambulatory Visit: Payer: Self-pay | Admitting: Cardiology

## 2009-05-20 ENCOUNTER — Emergency Department (HOSPITAL_BASED_OUTPATIENT_CLINIC_OR_DEPARTMENT_OTHER): Admission: EM | Admit: 2009-05-20 | Discharge: 2009-05-21 | Payer: Self-pay | Admitting: Emergency Medicine

## 2009-08-12 ENCOUNTER — Ambulatory Visit: Payer: Self-pay | Admitting: Internal Medicine

## 2009-08-12 ENCOUNTER — Telehealth: Payer: Self-pay | Admitting: Internal Medicine

## 2009-08-13 ENCOUNTER — Telehealth: Payer: Self-pay | Admitting: Internal Medicine

## 2009-08-17 ENCOUNTER — Telehealth: Payer: Self-pay | Admitting: Internal Medicine

## 2009-09-23 ENCOUNTER — Ambulatory Visit: Payer: Self-pay | Admitting: Internal Medicine

## 2009-09-23 DIAGNOSIS — L509 Urticaria, unspecified: Secondary | ICD-10-CM | POA: Insufficient documentation

## 2009-09-23 HISTORY — DX: Urticaria, unspecified: L50.9

## 2009-11-06 ENCOUNTER — Ambulatory Visit: Payer: Self-pay | Admitting: Internal Medicine

## 2009-11-11 ENCOUNTER — Ambulatory Visit: Payer: Self-pay | Admitting: Internal Medicine

## 2009-11-11 DIAGNOSIS — H6690 Otitis media, unspecified, unspecified ear: Secondary | ICD-10-CM | POA: Insufficient documentation

## 2009-11-13 ENCOUNTER — Telehealth: Payer: Self-pay | Admitting: Internal Medicine

## 2009-11-13 LAB — CONVERTED CEMR LAB
ALT: 40 units/L — ABNORMAL HIGH (ref 0–35)
AST: 37 units/L (ref 0–37)
Alkaline Phosphatase: 82 units/L (ref 39–117)
BUN: 9 mg/dL (ref 6–23)
Basophils Absolute: 0 10*3/uL (ref 0.0–0.1)
Basophils Relative: 0.4 % (ref 0.0–3.0)
Bilirubin, Direct: 0.1 mg/dL (ref 0.0–0.3)
Calcium: 9 mg/dL (ref 8.4–10.5)
Chloride: 106 meq/L (ref 96–112)
Cholesterol: 168 mg/dL (ref 0–200)
Creatinine, Ser: 0.8 mg/dL (ref 0.4–1.2)
Eosinophils Absolute: 0.2 10*3/uL (ref 0.0–0.7)
Eosinophils Relative: 2.5 % (ref 0.0–5.0)
GFR calc non Af Amer: 95.56 mL/min (ref >60)
HCT: 44.3 % (ref 36.0–46.0)
HDL: 32.6 mg/dL — ABNORMAL LOW (ref >39.00)
Hemoglobin: 14.8 g/dL (ref 12.0–15.0)
Hgb A1c MFr Bld: 7 % — ABNORMAL HIGH (ref 4.6–6.5)
LDL Cholesterol: 112 mg/dL — ABNORMAL HIGH (ref 0–99)
Lymphocytes Relative: 30 % (ref 12.0–46.0)
MCHC: 33.4 g/dL (ref 30.0–36.0)
MCV: 87.9 fL (ref 78.0–100.0)
Microalb, Ur: 2 mg/dL — ABNORMAL HIGH (ref 0.0–1.9)
Monocytes Absolute: 0.4 10*3/uL (ref 0.1–1.0)
Neutro Abs: 4.9 10*3/uL (ref 1.4–7.7)
Neutrophils Relative %: 61.6 % (ref 43.0–77.0)
Platelets: 284 10*3/uL (ref 150.0–400.0)
Potassium: 4.4 meq/L (ref 3.5–5.1)
RBC: 5.04 M/uL (ref 3.87–5.11)
RDW: 13 % (ref 11.5–14.6)
Sodium: 142 meq/L (ref 135–145)
TSH: 0.36 microintl units/mL (ref 0.35–5.50)
Total Bilirubin: 0.4 mg/dL (ref 0.3–1.2)
Total CHOL/HDL Ratio: 5
Triglycerides: 117 mg/dL (ref 0.0–149.0)
VLDL: 23.4 mg/dL (ref 0.0–40.0)
WBC: 8 10*3/uL (ref 4.5–10.5)

## 2009-12-01 ENCOUNTER — Ambulatory Visit: Payer: Self-pay | Admitting: Cardiology

## 2009-12-01 ENCOUNTER — Encounter: Payer: Self-pay | Admitting: Cardiology

## 2009-12-01 LAB — CONVERTED CEMR LAB
BUN: 10 mg/dL (ref 6–23)
Basophils Absolute: 0 10*3/uL (ref 0.0–0.1)
Basophils Relative: 0.4 % (ref 0.0–3.0)
CO2: 26 meq/L (ref 19–32)
Calcium: 9.3 mg/dL (ref 8.4–10.5)
Chloride: 102 meq/L (ref 96–112)
Creatinine, Ser: 0.9 mg/dL (ref 0.4–1.2)
Eosinophils Absolute: 0.2 10*3/uL (ref 0.0–0.7)
Eosinophils Relative: 2.2 % (ref 0.0–5.0)
GFR calc non Af Amer: 77.99 mL/min (ref >60)
Glucose, Bld: 134 mg/dL — ABNORMAL HIGH (ref 70–99)
HCT: 45.2 % (ref 36.0–46.0)
Hemoglobin: 14.9 g/dL (ref 12.0–15.0)
INR: 1.1 — ABNORMAL HIGH (ref 0.8–1.0)
Lymphocytes Relative: 33.3 % (ref 12.0–46.0)
Lymphs Abs: 2.8 10*3/uL (ref 0.7–4.0)
MCHC: 32.9 g/dL (ref 30.0–36.0)
MCV: 88.4 fL (ref 78.0–100.0)
Monocytes Absolute: 0.5 10*3/uL (ref 0.1–1.0)
Monocytes Relative: 6.5 % (ref 3.0–12.0)
Neutro Abs: 4.8 10*3/uL (ref 1.4–7.7)
Neutrophils Relative %: 57.6 % (ref 43.0–77.0)
Platelets: 311 10*3/uL (ref 150.0–400.0)
Potassium: 4.3 meq/L (ref 3.5–5.1)
Prothrombin Time: 11.3 s (ref 9.7–11.8)
RBC: 5.11 M/uL (ref 3.87–5.11)
RDW: 14.2 % (ref 11.5–14.6)
Sodium: 138 meq/L (ref 135–145)
WBC: 8.4 10*3/uL (ref 4.5–10.5)
aPTT: 28.4 s (ref 21.7–28.8)

## 2009-12-08 ENCOUNTER — Ambulatory Visit: Payer: Self-pay | Admitting: Cardiology

## 2009-12-08 ENCOUNTER — Ambulatory Visit (HOSPITAL_COMMUNITY): Admission: RE | Admit: 2009-12-08 | Discharge: 2009-12-09 | Payer: Self-pay | Admitting: Cardiology

## 2010-01-02 ENCOUNTER — Emergency Department (HOSPITAL_COMMUNITY)
Admission: EM | Admit: 2010-01-02 | Discharge: 2010-01-02 | Payer: Self-pay | Source: Home / Self Care | Admitting: Emergency Medicine

## 2010-01-02 ENCOUNTER — Encounter (INDEPENDENT_AMBULATORY_CARE_PROVIDER_SITE_OTHER): Payer: Self-pay | Admitting: Emergency Medicine

## 2010-01-02 ENCOUNTER — Encounter: Payer: Self-pay | Admitting: Internal Medicine

## 2010-01-13 ENCOUNTER — Ambulatory Visit: Payer: Self-pay | Admitting: Cardiology

## 2010-01-13 ENCOUNTER — Encounter: Payer: Self-pay | Admitting: Cardiology

## 2010-01-14 ENCOUNTER — Telehealth (INDEPENDENT_AMBULATORY_CARE_PROVIDER_SITE_OTHER): Payer: Self-pay | Admitting: *Deleted

## 2010-01-14 DIAGNOSIS — I714 Abdominal aortic aneurysm, without rupture, unspecified: Secondary | ICD-10-CM | POA: Insufficient documentation

## 2010-01-15 ENCOUNTER — Telehealth: Payer: Self-pay | Admitting: Internal Medicine

## 2010-02-05 ENCOUNTER — Ambulatory Visit: Admission: RE | Admit: 2010-02-05 | Discharge: 2010-02-05 | Payer: Self-pay | Source: Home / Self Care

## 2010-02-05 ENCOUNTER — Encounter: Payer: Self-pay | Admitting: Cardiology

## 2010-02-16 NOTE — Progress Notes (Signed)
  Phone Note Call from Patient   Caller: Patient Summary of Call: Patient called stating still in alot of pain and that the pain shot that she had this morning has not touched the pain. Is there anything else for her to do at this time. Initial call taken by: Robin Ewing CMA Duncan Dull),  August 12, 2009 4:33 PM  Follow-up for Phone Call        at this point,  she should cont her pain medicine she has at night, can also take tylenol during the day or alleve as needed, and the antibiotic should kick in soon Follow-up by: Corwin Levins MD,  August 12, 2009 4:35 PM  Additional Follow-up for Phone Call Additional follow up Details #1::        called pt informed of above information. Additional Follow-up by: Robin Ewing CMA [facility] Dull),  August 12, 2009 4:40 PM

## 2010-02-16 NOTE — Assessment & Plan Note (Signed)
Summary: 3 MOS F/U #/CD   Vital Signs:  Patient profile:   65 year old female Height:      66 inches Weight:      232 pounds BMI:     37.58 O2 Sat:      95 % on Room air Temp:     97.8 degrees F oral Pulse rate:   86 / minute BP sitting:   100 / 62  (left arm) Cuff size:   large  Vitals Entered By: Zella Ball Ewing CMA [facility] Dull) (November 11, 2009 11:25 AM)  O2 Flow:  Room air CC: 3 month followup/RE   Primary Care Provider:  Corwin Levins MD  CC:  3 month followup/RE.  History of Present Illness: here for acute and f/u - c/o 2-3 days moderate right ear pain, fever, headache, general weakness and malaise; wihtout ST, cough vertigo  and Pt denies CP, worsening sob, doe, wheezing, orthopnea, pnd, worsening LE edema, palps, dizziness or syncope .  Pt denies new neuro symptoms such as headache, facial or extremity weakness  Pt denies polydipsia, polyuria, or low sugar symptoms such as shakiness improved with eating.  Overall fair to good compliance with meds, trying to follow low chol, DM diet, wt stable, little excercise however  Only taking 2 metformin in the am as she often forgets the evening dose.  Denies hyper or hypothyroid symptoms such as voice, skin, wt change.  Needs pain med refills. Due for flu shot today. Denies worsening depressive symptoms, suicidal ideation, or panic.    Problems Prior to Update: 1)  Urticaria  (ICD-708.9) 2)  Chest Pain  (ICD-786.50) 3)  Wheezing  (ICD-786.07) 4)  Tobacco Abuse  (ICD-305.1) 5)  Coronary Artery Disease  (ICD-414.00) 6)  Hyperlipidemia  (ICD-272.4) 7)  Hypertension  (ICD-401.9) 8)  Obesity, Moderate  (ICD-278.00) 9)  Hypothyroidism  (ICD-244.9) 10)  Diabetes Mellitus, Type II  (ICD-250.00) 11)  Anxiety  (ICD-300.00) 12)  Depression  (ICD-311) 13)  Sinusitis- Acute-nos  (ICD-461.9) 14)  Menopausal Disorder  (ICD-627.9) 15)  Preventive Health Care  (ICD-V70.0) 16)  Asthmatic Bronchitis, Acute  (ICD-466.0) 17)  Urinary Frequency   (ICD-788.41) 18)  Otitis Media, Serous, Chronic  (ICD-381.10) 19)  Dysuria  (ICD-788.1) 20)  Dyspepsia  (ICD-536.8) 21)  Disc Disease, Lumbosacral Spine  (ICD-722.52) 22)  Constipation  (ICD-564.00) 23)  Allergic Rhinitis  (ICD-477.9)  Medications Prior to Update: 1)  Synthroid 150 Mcg  Tabs (Levothyroxine Sodium) .[name].. 1 By Mouth Once Daily (Daw) 2)  Atenolol 25 Mg  Tabs (Atenolol) .[name].. 1 By Mouth  Two Times A Day 3)  Adult Aspirin Ec Low Strength 81 Mg  Tbec (Aspirin) .... Take 1 Tablet By Mouth Once A Day 4)  Metformin Hcl 500 Mg Xr24h-Tab (Metformin Hcl) .... 2 in The Morning and 1 At Night 5)  Hydrocodone-Acetaminophen 10-325 Mg Tabs (Hydrocodone-Acetaminophen) .[name].. 1 By Mouth At Bedtime As Needed 6)  Alprazolam 0.5 Mg  Tabs (Alprazolam) .... 1/2 Toi 1 By Mouth Q 6 Hrs As Needed Nerves 7)  Fexofenadine Hcl 180 Mg Tabs (Fexofenadine Hcl) .[name].. 1po Once Daily As Needed 8)  Crestor 5 Mg Tabs (Rosuvastatin Calcium) .[name].. 1 By Mouth Once Daily 9)  Hydroxyzine Hcl 25 Mg Tabs (Hydroxyzine Hcl) .[name].. 1 By Mouth Every 6 Hours As Needed For Itch  Current Medications (verified): 1)  Levothyroxine Sodium 150 Mcg Tabs (Levothyroxine Sodium) .[name].. 1po Once Daily 2)  Atenolol 25 Mg  Tabs (Atenolol) .[name].. 1 By Mouth  Two Times  A Day 3)  Adult Aspirin Ec Low Strength 81 Mg  Tbec (Aspirin) .... Take 1 Tablet By Mouth Once A Day 4)  Metformin Hcl 500 Mg Xr24h-Tab (Metformin Hcl) .... 3 By Mouth Qam 5)  Hydrocodone-Acetaminophen 10-325 Mg Tabs (Hydrocodone-Acetaminophen) .[name].. 1 By Mouth At Bedtime As Needed 6)  Alprazolam 0.5 Mg  Tabs (Alprazolam) .... 1/2 Toi 1 By Mouth Q 6 Hrs As Needed Nerves 7)  Fexofenadine Hcl 180 Mg Tabs (Fexofenadine Hcl) .[name].. 1po Once Daily As Needed 8)  Hydroxyzine Hcl 25 Mg Tabs (Hydroxyzine Hcl) .[name].. 1 By Mouth Every 6 Hours As Needed For Itch 9)  Crestor 20 Mg Tabs (Rosuvastatin Calcium) .... 1/2 Po Once Daily 10)  Cephalexin 500 Mg Caps (Cephalexin) .[name].. 1po Three Times A  Day  Allergies (verified): 1)  * Tequin 2)  Codeine 3)  Doxycycline 4)  Monistat 7 (Miconazole Nitrate)  Past History:  Past Medical History: Last updated: 09/23/2009 CORONARY ARTERY DISEASE (ICD-414.00) HYPERLIPIDEMIA (ICD-272.4) HYPERTENSION (ICD-401.9) OBESITY, MODERATE (ICD-278.00) HYPOTHYROIDISM (ICD-244.9) DIABETES MELLITUS, TYPE II (ICD-250.00) ANXIETY (ICD-300.00) DEPRESSION (ICD-311) MENOPAUSAL DISORDER (ICD-627.9) OTITIS MEDIA, SEROUS, CHRONIC (ICD-381.10) DISC DISEASE, LUMBOSACRAL SPINE (ICD-722.52) ALLERGIC RHINITIS (ICD-477.9)  Past Surgical History: Last updated: 11/17/2008 Hysterectomy Oophorectomy Lumbar fusion-3 level with fixation  Jan '09 ([facility] Parathyroidectomy Thyroidectomy  percutaneous coronary intervention of both the circumflex and left anterior descending artery.  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   Eyes:  vision grossly intact, pupils equal, and pupils round.   Ears:  right tm mod erythema, non buligng, left TM ok, canals clear, sinus nontender Nose:  nasal dischargemucosal pallor and mucosal edema.   Mouth:  pharyngeal erythema and fair dentition.   Neck:  supple and no masses.   Lungs:  normal respiratory effort and normal breath sounds.   Heart:  normal rate and regular rhythm.   Extremities:  no edema, no erythema    Impression & Recommendations:  Problem # 1:  OTITIS MEDIA, ACUTE, RIGHT (ICD-382.9)  Her updated medication list for this problem includes:    Adult Aspirin Ec Low Strength 81 Mg Tbec (Aspirin) .[name]... Take 1 tablet by mouth once a day    Cephalexin 500 Mg Caps (Cephalexin) .[name]... 1po three times a  day treat as above, f/u any worsening signs or symptoms   Problem # 2:  DIABETES MELLITUS, TYPE II (ICD-250.00)  Her updated medication list for this problem includes:    Adult Aspirin Ec Low Strength 81 Mg Tbec (Aspirin) .[name]... Take 1 tablet by mouth once a day    Metformin Hcl 500 Mg Xr24h-tab (Metformin hcl) .Marland KitchenMarland KitchenMarland KitchenMarland Kitchen 3 by mouth qam  Labs Reviewed: Creat: 0.7 (04/24/2009)    Reviewed HgBA1c results: 7.1 (04/24/2009)  6.6 (10/23/2008) stable overall by hx and exam, ok to increase the metformin to 3 in the am , Pt to cont DM diet, excercise, wt control efforts  Problem # 3:  HYPERLIPIDEMIA (ICD-272.4)  The following medications were removed from the medication list:    Crestor 5 Mg Tabs (Rosuvastatin calcium) .[name]... 1 by mouth once daily Her updated medication list for this problem includes:    Crestor 20 Mg Tabs (Rosuvastatin calcium) .[name]... 1/2 po  once daily  Labs Reviewed: SGOT: 39 (12/01/2008)   SGPT: 46 (12/01/2008)   HDL:33.10 (04/24/2009), 28.70 (12/01/2008)  LDL:94 (04/24/2009), 120 (56/21/3086)  Chol:161 (04/24/2009), 169 (12/01/2008)  Trig:171.0 (04/24/2009), 102.0 (12/01/2008) stable overall by hx and exam, ok to continue meds/tx as is   Problem # 4:  HYPERTENSION (ICD-401.9)  Her updated medication list for this problem includes:    Atenolol 25 Mg Tabs (Atenolol) .[name]... 1 by mouth  two times a day  BP today: 100/62 Prior BP: 112/72 (09/23/2009)  Labs Reviewed: K+: 4.0 (04/24/2009) Creat: : 0.7 (04/24/2009)   Chol: 161 (04/24/2009)   HDL: 33.10 (04/24/2009)   LDL: 94 (04/24/2009)   TG: 171.0 (04/24/2009) stable overall by hx and exam, ok to continue meds/tx as is   Problem # 5:  DISC DISEASE, LUMBOSACRAL SPINE (ICD-722.52) stable overall by hx and exam, ok to continue meds/tx as is   Complete Medication List: 1)  Levothyroxine Sodium 150 Mcg Tabs (Levothyroxine sodium) .[name].. 1po once daily 2)  Atenolol 25 Mg Tabs (Atenolol) .[name].. 1 by mouth  two times a day 3)  Adult  Aspirin Ec Low Strength 81 Mg Tbec (Aspirin) .... Take 1 tablet by mouth once a day 4)  Metformin Hcl 500 Mg Xr24h-tab (Metformin hcl) .... 3 by mouth qam 5)  Hydrocodone-acetaminophen 10-325 Mg Tabs (Hydrocodone-acetaminophen) .[name].. 1 by mouth at bedtime as needed 6)  Alprazolam 0.5 Mg Tabs (Alprazolam) .... 1/2 toi 1 by mouth q 6 hrs as needed nerves 7)  Fexofenadine Hcl 180 Mg Tabs (Fexofenadine hcl) .[name].. 1po once daily as needed 8)  Hydroxyzine Hcl 25 Mg Tabs (Hydroxyzine hcl) .[name].. 1 by mouth every 6 hours as needed for itch 9)  Crestor 20 Mg Tabs (Rosuvastatin calcium) .... 1/2 po once daily 10)  Cephalexin 500 Mg Caps (Cephalexin) .[name].. 1po three times a day  Other Orders: Flu Vaccine 46yrs + MEDICARE PATIENTS (V7846) Administration Flu vaccine - MCR (N6295)  Patient Instructions: 1)  ok to change the brand name synthroid to the generic 2)  Please schedule a follow-up appointment in 4 months - late feb 2012 for thyroid check 3)  you can take all 3 metformin in the AM  4)  incresae the crestor to 10 mg (which is HALF of the 20 mg prescriptoin) 5)  Please take all new medications as prescribed - the antibiotic 6)  Continue all previous medications as before this visit , except for the changes above 7)  you had the flu shot today Prescriptions: HYDROXYZINE HCL 25 MG TABS (HYDROXYZINE HCL) 1 by mouth every 6 hours as needed for itch  #60 x 1   Entered and Authorized by:   Corwin Levins MD   Signed by:   Corwin Levins MD on 11/11/2009   Method used:   Print then Give to Patient   RxID:   [id number] FEXOFENADINE HCL 180 MG TABS (FEXOFENADINE HCL) 1po once daily as needed  #30 x 11   Entered and Authorized by:   Corwin Levins MD   Signed by:   Corwin Levins MD on 11/11/2009   Method used:   Print then Give to Patient   RxID:   [id number] ALPRAZOLAM 0.5 MG  TABS (ALPRAZOLAM) 1/2 toi 1 by mouth q 6 hrs as needed nerves  #60 x 2   Entered and Authorized by:   Corwin Levins MD    Signed by:   Corwin Levins MD on 11/11/2009   Method used:  Print then Give to Patient   RxID:   [id number] METFORMIN HCL 500 MG XR24H-TAB (METFORMIN HCL) 3 by mouth qam  #270 x 3   Entered and Authorized by:   Corwin Levins MD   Signed by:   Corwin Levins MD on 11/11/2009   Method used:   Print then Give to Patient   RxID:   [id number] ATENOLOL 25 MG  TABS (ATENOLOL) 1 by mouth  two times a day  #180 x 3   Entered and Authorized by:   Corwin Levins MD   Signed by:   Corwin Levins MD on 11/11/2009   Method used:   Print then Give to Patient   RxID:   [id number] CEPHALEXIN 500 MG CAPS (CEPHALEXIN) 1po three times a day  #30 x 0   Entered and Authorized by:   Corwin Levins MD   Signed by:   Corwin Levins MD on 11/11/2009   Method used:   Print then Give to Patient   RxID:   [id number] LEVOTHYROXINE SODIUM 150 MCG TABS (LEVOTHYROXINE SODIUM) 1po once daily  #90 x 3   Entered and Authorized by:   Corwin Levins MD   Signed by:   Corwin Levins MD on 11/11/2009   Method used:   Print then Give to Patient   RxID:   [id number] CRESTOR 20 MG TABS (ROSUVASTATIN CALCIUM) 1po once daily  #90 x 3   Entered and Authorized by:   Corwin Levins MD   Signed by:   Corwin Levins MD on 11/11/2009   Method used:   Print then Give to Patient   RxID:   [id number]    Orders Added: 1)  Flu Vaccine 68yrs + MEDICARE PATIENTS [Q2039] 2)  Administration Flu vaccine - MCR [G0008] 3)  Est. Patient Level IV [18299]   Flu Vaccine Consent Questions     Do you have a history of severe allergic reactions to this vaccine? no    Any prior history of allergic reactions to egg and/or gelatin? no    Do you have a sensitivity to the preservative Thimersol? no    Do you have a past history of Guillan-Barre Syndrome? no    Do you currently have an acute febrile illness? no    Have you ever had a severe reaction to latex? no    Vaccine information given and explained to patient? yes    Are you  currently pregnant? no    Lot Number:[id number]   Exp Date:07/17/2010   Site Given  Left Deltoid [id number]

## 2010-02-16 NOTE — Assessment & Plan Note (Signed)
Summary: BUTTOCKS,LEGS,THIGHS RASH--DR JWJ PT/NO SLOT--STC   Vital Signs:  Patient profile:   65 year old female Height:      66 inches (167.64 cm) Weight:      238 pounds (108.18 kg) O2 Sat:      97 % on Room air Temp:     98.1 degrees F (36.72 degrees C) oral Pulse rate:   90 / minute BP sitting:   112 / 72  (left arm) Cuff size:   large  Vitals Entered By: Orlan Leavens RMA (September 23, 2009 1:21 PM)  O2 Flow:  Room air CC: Rash on buttocks, thigh and legs, Headache Is Patient Diabetic? Yes Did you bring your meter with you today? No Pain Assessment Patient in pain? yes      Type: stinging   Primary Care Provider:  Corwin Levins MD  CC:  Rash on buttocks, thigh and legs, and Headache.  History of Present Illness:  Rash      This is a 65 year old woman who presents with Rash.  The symptoms began 1 week ago.  The severity is described as moderate.  spreading rash since using miconazole cream for yeast following the abx for ear infx (given 08/13/09) - no prior hx same -.  The patient reports macules, hives, and welts, but denies nodules, blisters, and ulcers.  The rash is located on the chest, back, abdomen, groin, right thigh, and left thigh.  The rash is worse with heat, worse with scratching, and better with OTC creams.  The patient denies the following symptoms: fever, headache, facial swelling, tongue swelling, burning, difficulty breathing, abdominal pain, nausea, vomiting, diarrhea, dizziness, sore throat, dysuria, eye symptoms, arthralgias, and vaginal discharge.  The patient reports a history of recent infection, recent antibiotic use, and new medication.  The patient denies history of recent tick bite, recent tick exposure, other insect bite, new clothing, new topical exposure, recent travel, pet/animal contact, thyroid disease, and chronic liver disease.     Current Medications (verified): 1)  Synthroid 150 Mcg  Tabs (Levothyroxine Sodium) .[name].. 1 By Mouth Once Daily  (Daw) 2)  Atenolol 25 Mg  Tabs (Atenolol) .[name].. 1 By Mouth  Two Times A Day 3)  Adult Aspirin Ec Low Strength 81 Mg  Tbec (Aspirin) .... Take 1 Tablet By Mouth Once A Day 4)  Metformin Hcl 500 Mg Xr24h-Tab (Metformin Hcl) .... 2 in The Morning and 1 At Night 5)  Hydrocodone-Acetaminophen 10-325 Mg Tabs (Hydrocodone-Acetaminophen) .[name].. 1 By Mouth At Bedtime As Needed 6)  Alprazolam 0.5 Mg  Tabs (Alprazolam) .... 1/2 Toi 1 By Mouth Q 6 Hrs As Needed Nerves 7)  Fexofenadine Hcl 180 Mg Tabs (Fexofenadine Hcl) .[name].. 1po Once Daily As Needed 8)  Crestor 5 Mg Tabs (Rosuvastatin Calcium) .[name].. 1 By Mouth Once Daily  Allergies (verified): 1)  * Tequin 2)  Codeine 3)  Doxycycline 4)  Monistat 7 (Miconazole Nitrate)  Past History:  Past Medical History: CORONARY ARTERY DISEASE (ICD-414.00) HYPERLIPIDEMIA (ICD-272.4) HYPERTENSION (ICD-401.9) OBESITY, MODERATE (ICD-278.00) HYPOTHYROIDISM (ICD-244.9) DIABETES MELLITUS, TYPE II (ICD-250.00) ANXIETY (ICD-300.00) DEPRESSION (ICD-311) MENOPAUSAL DISORDER (ICD-627.9) OTITIS MEDIA, SEROUS, CHRONIC (ICD-381.10) DISC DISEASE, LUMBOSACRAL SPINE (ICD-722.52) ALLERGIC RHINITIS (ICD-477.9)  Review of Systems  The patient denies fever, weight loss, chest pain, headaches, and abdominal pain.    Physical Exam  General:  alert and overweight-appearing. nontoxic Lungs:  normal respiratory effort and normal breath sounds.   Heart:  normal rate and regular rhythm.   Skin:  hives -  large welts - across abd, buttocks, groin and posterior thighs -    Impression & Recommendations:  Problem # 1:  URTICARIA (ICD-708.9)  ?trigger by septra (rx 08/13/09) or miconazole topical cream - pt believes the later - tx with steroids and antihist now - depo medrol + pred pack + hydrox call if not improved 7 days, sooner if worse - avoid antifungal in future and cautious use of sulfa products - pt educated on same  Orders: Prescription Created Electronically  [id number]) Depo- Medrol 80mg  (J1040) Depo- Medrol 40mg  (J1030) Admin of Therapeutic Inj  intramuscular or subcutaneous (13086)  Complete Medication List: 1)  Synthroid 150 Mcg Tabs (Levothyroxine sodium) .[name].. 1 by mouth once daily (daw) 2)  Atenolol 25 Mg Tabs (Atenolol) .[name].. 1 by mouth  two times a day 3)  Adult Aspirin Ec Low Strength 81 Mg Tbec (Aspirin) .... Take 1 tablet by mouth once a day 4)  Metformin Hcl 500 Mg Xr24h-tab (Metformin hcl) .... 2 in the morning and 1 at night 5)  Hydrocodone-acetaminophen 10-325 Mg Tabs (Hydrocodone-acetaminophen) .[name].. 1 by mouth at bedtime as needed 6)  Alprazolam 0.5 Mg Tabs (Alprazolam) .... 1/2 toi 1 by mouth q 6 hrs as needed nerves 7)  Fexofenadine Hcl 180 Mg Tabs (Fexofenadine hcl) .[name].. 1po once daily as needed 8)  Crestor 5 Mg Tabs (Rosuvastatin calcium) .[name].. 1 by mouth once daily 9)  Hydroxyzine Hcl 25 Mg Tabs (Hydroxyzine hcl) .[name].. 1 by mouth every 6 hours as needed for itch 10)  Prednisone (pak) 10 Mg Tabs (Prednisone) .... As directed x 6 days  Patient Instructions: 1)  it was good to see you today. 2)  depo-medrol shot given today for your hives - 3)  pred pak and hydroxyzine for itch symptoms - your prescriptions have been electronically submitted to your pharmacy. Please take as directed. Contact our office if you believe you're having problems with the medication(s).  4)  avoid contact with miconazole in the future - 5)  if your rash and itch is not improved in 7-10days, or if getting worse, call for further evaluation as needed  Prescriptions: PREDNISONE (PAK) 10 MG TABS (PREDNISONE) as directed x 6 days  #1 x 0   Entered and Authorized by:   Newt Lukes MD   Signed by:   Newt Lukes MD on 09/23/2009   Method used:   Electronically to        [facility]. [phone]* (retail)       [address]       [place], [place]  [address]       Ph: [phone]       Fax: [phone]   RxID:    [id number] HYDROXYZINE HCL 25 MG TABS (HYDROXYZINE HCL) 1 by mouth every 6 hours as needed for itch  #30 x 1   Entered and Authorized by:   Newt Lukes MD   Signed by:   Newt Lukes MD on 09/23/2009   Method used:   Electronically to        [facility]. [phone]* (retail)       [address]       [place], [place]  [address]       Ph: [phone]       Fax: [phone]   RxID:   [id number]    Medication Administration  Injection #  1:    Medication: Depo- Medrol 80mg     Diagnosis: URTICARIA (ICD-708.9)    Route: IM    Site: RUOQ gluteus    Exp Date: 04/2012    Lot #: [id number]    Mfr: [employer]    Comments: Gave total 120mg     Patient tolerated injection without complications    Given by: Orlan Leavens RMA (September 23, 2009 4:56 PM)  Injection # 2:    Medication: Depo- Medrol 40mg     Diagnosis: URTICARIA (ICD-708.9)  Orders Added: 1)  Est. Patient Level IV [16109] 2)  Prescription Created Electronically [G8553] 3)  Depo- Medrol 80mg  [J1040] 4)  Depo- Medrol 40mg  [J1030] 5)  Admin of Therapeutic Inj  intramuscular or subcutaneous [60454]

## 2010-02-16 NOTE — Progress Notes (Signed)
Summary: Yeast infection  Phone Note Call from Patient Call back at Home Phone [phone]   Caller: Patient Summary of Call: Pt called requesting Rx to treat vaginal yeast infection caused by ABX. Initial call taken by: Margaret Pyle, CMA,  August 17, 2009 2:19 PM  Follow-up for Phone Call        done per emr Follow-up by: Corwin Levins MD,  August 17, 2009 2:29 PM  Additional Follow-up for Phone Call Additional follow up Details #1::        Pt informed Additional Follow-up by: Margaret Pyle, CMA,  August 17, 2009 2:47 PM    New/Updated Medications: FLUCONAZOLE 150 MG TABS (FLUCONAZOLE) 1 by mouth every third day as needed Prescriptions: FLUCONAZOLE 150 MG TABS (FLUCONAZOLE) 1 by mouth every third day as needed  #2 x 1   Entered and Authorized by:   Corwin Levins MD   Signed by:   Corwin Levins MD on 08/17/2009   Method used:   Electronically to        [facility]. [phone]* (retail)       [address]       [place], [place]  [address]       Ph: [phone]       Fax: [phone]   RxID:   [id number]

## 2010-02-16 NOTE — Assessment & Plan Note (Signed)
Summary: 6 month rov/sl  Medications Added METFORMIN HCL 500 MG XR24H-TAB (METFORMIN HCL) 2 in the morning and 1 at night        Visit Type:  6 months follow up Primary Provider:  Corwin Levins MD  CC:  Chest pains-.  History of Present Illness: Patient is retired.  Still not exercising.  Patient had some upper left chest pain, and it was worse with motion.  It occurred for about two or three weeks.  It then went away when she took sinus medications.  She was bringing alot of stuff about.  Was placed on Azithromycin started by Dr. Jonny Ruiz.  She has two more days of it.    Current Medications (verified): 1)  Synthroid 150 Mcg  Tabs (Levothyroxine Sodium) .[name].. 1 By Mouth Once Daily (Daw) 2)  Atenolol 25 Mg  Tabs (Atenolol) .[name].. 1 By Mouth  Two Times A Day 3)  Adult Aspirin Ec Low Strength 81 Mg  Tbec (Aspirin) .... Take 1 Tablet By Mouth Once A Day 4)  Metformin Hcl 500 Mg Xr24h-Tab (Metformin Hcl) .... 2 in The Morning and 1 At Night 5)  Hydrocodone-Acetaminophen 10-325 Mg Tabs (Hydrocodone-Acetaminophen) .[name].. 1 By Mouth At Bedtime As Needed 6)  Alprazolam 0.5 Mg  Tabs (Alprazolam) .... 1/2 Toi 1 By Mouth Q 6 Hrs As Needed Nerves 7)  Fexofenadine Hcl 180 Mg Tabs (Fexofenadine Hcl) .[name].. 1po Once Daily As Needed 8)  Crestor 5 Mg Tabs (Rosuvastatin Calcium) .[name].. 1 By Mouth Once Daily 9)  Azithromycin 250 Mg Tabs (Azithromycin) .... 2po Qd For 1 Day, Then 1po Qd For 4days, Then Stop  Allergies: 1)  * Tequin 2)  Codeine 3)  Doxycycline  Past History:  Past Medical History: Last updated: 12-17-08 Current Problems:  CORONARY ARTERY DISEASE (ICD-414.00) HYPERLIPIDEMIA (ICD-272.4) HYPERTENSION (ICD-401.9) OBESITY, MODERATE (ICD-278.00) HYPOTHYROIDISM (ICD-244.9) DIABETES MELLITUS, TYPE II (ICD-250.00) ANXIETY (ICD-300.00) DEPRESSION (ICD-311) SINUSITIS- ACUTE-NOS (ICD-461.9) MENOPAUSAL DISORDER (ICD-627.9) PREVENTIVE HEALTH CARE (ICD-V70.0) ASTHMATIC BRONCHITIS, ACUTE  (ICD-466.0) URINARY FREQUENCY (ICD-788.41) OTITIS MEDIA, SEROUS, CHRONIC (ICD-381.10) DYSURIA (ICD-788.1) DYSPEPSIA (ICD-536.8) DISC DISEASE, LUMBOSACRAL SPINE (ICD-722.52) CONSTIPATION (ICD-564.00) ALLERGIC RHINITIS (ICD-477.9)  Past Surgical History: Last updated: 12/17/08 Hysterectomy Oophorectomy Lumbar fusion-3 level with fixation  Jan '09 ([facility] Parathyroidectomy Thyroidectomy  percutaneous coronary intervention of both the circumflex and left anterior descending artery.  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 Head:  normocephalic and atraumatic Eyes:  PERRLA/EOM intact; conjunctiva and lids normal. Lungs:  Clear bilaterally to auscultation and percussion. Heart:  PMI non displaced.  Normal S1 and S2.  Pos S4.   Extremities:  trace edema.   Neurologic:  Alert and oriented x 3.   EKG  Procedure date:  05/19/2009  Findings:  NSR.  Left axis deviation.    Impression & Recommendations:  Problem # 1:  CORONARY ARTERY DISEASE (ICD-414.00)  Seems to be relatively stable.  No acceleration of symptoms..   The following medications were removed from the medication list:    Plavix 75 Mg Tabs (Clopidogrel bisulfate) .[name]... 1 by mouth once daily (out) Her updated medication list for this problem includes:    Atenolol 25 Mg Tabs (Atenolol) .[name]... 1 by mouth  two times a  day    Adult Aspirin Ec Low Strength 81 Mg Tbec (Aspirin) .[name]... Take 1 tablet by mouth once a day  Orders: EKG w/ Interpretation (93000)  Problem # 2:  TOBACCO ABUSE (ICD-305.1) Has reduced it alot, so barely smoking at this point.    Problem # 3:  HYPERLIPIDEMIA (ICD-272.4) Not taking it at present.  She has not started yet.  Not sure if she can afford.   Her updated medication list for this problem includes:    Crestor 5 Mg Tabs (Rosuvastatin calcium) .[name]... 1 by mouth once daily  Patient Instructions: 1)  Your physician recommends that you schedule a follow-up appointment in: 6 MONTHS WITH DR. Riley Kill 2)  Your physician recommends that you continue on your current medications as directed. Please refer to the Current Medication list given to you today.

## 2010-02-16 NOTE — Assessment & Plan Note (Signed)
Summary: EAR ACHE AND SORE THROAT/NWS   Vital Signs:  Patient profile:   65 year old female Height:      66 inches Weight:      238 pounds BMI:     38.55 O2 Sat:      95 % on Room air Temp:     99 degrees F oral Pulse rate:   79 / minute BP sitting:   120 / 72  (left arm) Cuff size:   large  Vitals Entered By: Zella Ball Ewing CMA Duncan Dull) (August 12, 2009 10:56 AM)  O2 Flow:  Room air CC: right ear pain, sore throat/RE   Primary Care Provider:  Corwin Levins MD  CC:  right ear pain and sore throat/RE.  History of Present Illness: here for acute visit with severe right ear pain, sinus fullness , and marked severe ST and right neck tenderness and fullness for 2 days;  no  chills ; Pt denies CP, sob, doe, wheezing, orthopnea, pnd, worsening LE edema, palps, dizziness or syncope  Pt denies new neuro symptoms such as headache, facial or extremity weakness   Pt denies polydipsia, polyuria, or low sugar symptoms such as shakiness improved with eating.  Overall good compliance with meds, trying to follow low chol, DM diet, wt stable, little excercise however  Good med tolerance.  No n/v with the acute illness.   Tearful today, and relates she had similar in april, was seen in the ER where he was tx with morphine 6 cc.   Problems Prior to Update: 1)  Chest Pain  (ICD-786.50) 2)  Sinusitis- Acute-nos  (ICD-461.9) 3)  Wheezing  (ICD-786.07) 4)  Tobacco Abuse  (ICD-305.1) 5)  Coronary Artery Disease  (ICD-414.00) 6)  Hyperlipidemia  (ICD-272.4) 7)  Hypertension  (ICD-401.9) 8)  Obesity, Moderate  (ICD-278.00) 9)  Hypothyroidism  (ICD-244.9) 10)  Diabetes Mellitus, Type II  (ICD-250.00) 11)  Anxiety  (ICD-300.00) 12)  Depression  (ICD-311) 13)  Sinusitis- Acute-nos  (ICD-461.9) 14)  Menopausal Disorder  (ICD-627.9) 15)  Preventive Health Care  (ICD-V70.0) 16)  Asthmatic Bronchitis, Acute  (ICD-466.0) 17)  Urinary Frequency  (ICD-788.41) 18)  Otitis Media, Serous, Chronic  (ICD-381.10) 19)   Dysuria  (ICD-788.1) 20)  Dyspepsia  (ICD-536.8) 21)  Disc Disease, Lumbosacral Spine  (ICD-722.52) 22)  Constipation  (ICD-564.00) 23)  Allergic Rhinitis  (ICD-477.9)  Medications Prior to Update: 1)  Synthroid 150 Mcg  Tabs (Levothyroxine Sodium) .[name].. 1 By Mouth Once Daily (Daw) 2)  Atenolol 25 Mg  Tabs (Atenolol) .[name].. 1 By Mouth  Two Times A Day 3)  Adult Aspirin Ec Low Strength 81 Mg  Tbec (Aspirin) .... Take 1 Tablet By Mouth Once A Day 4)  Metformin Hcl 500 Mg Xr24h-Tab (Metformin Hcl) .... 2 in The Morning and 1 At Night 5)  Hydrocodone-Acetaminophen 10-325 Mg Tabs (Hydrocodone-Acetaminophen) .[name].. 1 By Mouth At Bedtime As Needed 6)  Alprazolam 0.5 Mg  Tabs (Alprazolam) .... 1/2 Toi 1 By Mouth Q 6 Hrs As Needed Nerves 7)  Fexofenadine Hcl 180 Mg Tabs (Fexofenadine Hcl) .[name].. 1po Once Daily As Needed 8)  Crestor 5 Mg Tabs (Rosuvastatin Calcium) .[name].. 1 By Mouth Once Daily 9)  Azithromycin 250 Mg Tabs (Azithromycin) .... 2po Qd For 1 Day, Then 1po Qd For 4days, Then Stop  Current Medications (verified): 1)  Synthroid 150 Mcg  Tabs (Levothyroxine Sodium) .[name].. 1 By Mouth Once Daily (Daw) 2)  Atenolol 25 Mg  Tabs (Atenolol) .[name].. 1 By Mouth  Two  Times A Day 3)  Adult Aspirin Ec Low Strength 81 Mg  Tbec (Aspirin) .... Take 1 Tablet By Mouth Once A Day 4)  Metformin Hcl 500 Mg Xr24h-Tab (Metformin Hcl) .... 2 in The Morning and 1 At Night 5)  Hydrocodone-Acetaminophen 10-325 Mg Tabs (Hydrocodone-Acetaminophen) .[name].. 1 By Mouth At Bedtime As Needed 6)  Alprazolam 0.5 Mg  Tabs (Alprazolam) .... 1/2 Toi 1 By Mouth Q 6 Hrs As Needed Nerves 7)  Fexofenadine Hcl 180 Mg Tabs (Fexofenadine Hcl) .[name].. 1po Once Daily As Needed 8)  Crestor 5 Mg Tabs (Rosuvastatin Calcium) .[name].. 1 By Mouth Once Daily 9)  Clarithromycin 500 Mg Tabs (Clarithromycin) .[name].. 1 By Mouth Two Times A Day  Allergies (verified): 1)  * Tequin 2)  Codeine 3)  Doxycycline  Past History:  Past Medical History: Last updated:  11/17/2008 Current Problems:  CORONARY ARTERY DISEASE (ICD-414.00) HYPERLIPIDEMIA (ICD-272.4) HYPERTENSION (ICD-401.9) OBESITY, MODERATE (ICD-278.00) HYPOTHYROIDISM (ICD-244.9) DIABETES MELLITUS, TYPE II (ICD-250.00) ANXIETY (ICD-300.00) DEPRESSION (ICD-311) SINUSITIS- ACUTE-NOS (ICD-461.9) MENOPAUSAL DISORDER (ICD-627.9) PREVENTIVE HEALTH CARE (ICD-V70.0) ASTHMATIC BRONCHITIS, ACUTE (ICD-466.0) URINARY FREQUENCY (ICD-788.41) OTITIS MEDIA, SEROUS, CHRONIC (ICD-381.10) DYSURIA (ICD-788.1) DYSPEPSIA (ICD-536.8) DISC DISEASE, LUMBOSACRAL SPINE (ICD-722.52) CONSTIPATION (ICD-564.00) ALLERGIC RHINITIS (ICD-477.9)  Past Surgical History: Last updated: 11/17/2008 Hysterectomy Oophorectomy Lumbar fusion-3 level with fixation  Jan '09 ([facility] Parathyroidectomy Thyroidectomy  percutaneous coronary intervention of both the circumflex and left anterior descending artery.  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  , mild to mod ill, tearfull, holding the right ear in her hand, grimacing in pain Head:  normocephalic and atraumatic.   Eyes:  vision grossly intact, pupils equal, and pupils round.   Ears:  right tm severe erythema, mild bullgind , canals ok, left TM with mild erythema only Nose:  nasal dischargemucosal pallor and mucosal edema.   Mouth:  pharyngeal erythema and fair dentition.   Neck:  supple and cervical lymphadenopathy.   Lungs:  normal respiratory effort and normal breath sounds.   Heart:  normal rate and regular rhythm.   Extremities:  no edema, no erythema    Impression & Recommendations:  Problem # 1:  OTITIS MEDIA, ACUTE, RIGHT (ICD-382.9)  Her updated medication list for this problem includes:     Adult Aspirin Ec Low Strength 81 Mg Tbec (Aspirin) .[name]... Take 1 tablet by mouth once a day    Clarithromycin 500 Mg Tabs (Clarithromycin) .[name]... 1 by mouth two times a day severe pain per pt, for rocephin IM today, toradol 30 mg IM, cont pain meds as needed and antibx for home  Problem # 2:  HYPERTENSION (ICD-401.9)  Her updated medication list for this problem includes:    Atenolol 25 Mg Tabs (Atenolol) .[name]... 1 by mouth  two times a day  BP today: 120/72 Prior BP: 110/78 (05/19/2009)  Labs Reviewed: K+: 4.0 (04/24/2009) Creat: : 0.7 (04/24/2009)   Chol: 161 (04/24/2009)   HDL: 33.10 (04/24/2009)   LDL: 94 (04/24/2009)   TG: 171.0 (04/24/2009) stable overall by hx and exam, ok to continue meds/tx as is   Problem # 3:  DIABETES MELLITUS, TYPE II (ICD-250.00)  Her updated medication list for this problem includes:    Adult Aspirin Ec Low Strength 81 Mg Tbec (Aspirin) .Marland KitchenMarland KitchenMarland KitchenMarland Kitchen  Take 1 tablet by mouth once a day    Metformin Hcl 500 Mg Xr24h-tab (Metformin hcl) .[name]... 2 in the morning and 1 at night  Labs Reviewed: Creat: 0.7 (04/24/2009)    Reviewed HgBA1c results: 7.1 (04/24/2009)  6.6 (10/23/2008) stable overall by hx and exam, ok to continue meds/tx as is   Problem # 4:  ANXIETY (ICD-300.00)  Her updated medication list for this problem includes:    Alprazolam 0.5 Mg Tabs (Alprazolam) .[name]... 1/2 toi 1 by mouth q 6 hrs as needed nerves acutely worse today - tearful in pain, for treatment as above, f/u any worsening signs or symptoms   - o/w stable overall by hx and exam, ok to continue meds/tx as is   Complete Medication List: 1)  Synthroid 150 Mcg Tabs (Levothyroxine sodium) .[name].. 1 by mouth once daily (daw) 2)  Atenolol 25 Mg Tabs (Atenolol) .[name].. 1 by mouth  two times a day 3)  Adult Aspirin Ec Low Strength 81 Mg Tbec (Aspirin) .... Take 1 tablet by mouth once a day 4)  Metformin Hcl 500 Mg Xr24h-tab (Metformin hcl) .... 2 in the morning and 1 at night 5)  Hydrocodone-acetaminophen  10-325 Mg Tabs (Hydrocodone-acetaminophen) .[name].. 1 by mouth at bedtime as needed 6)  Alprazolam 0.5 Mg Tabs (Alprazolam) .... 1/2 toi 1 by mouth q 6 hrs as needed nerves 7)  Fexofenadine Hcl 180 Mg Tabs (Fexofenadine hcl) .[name].. 1po once daily as needed 8)  Crestor 5 Mg Tabs (Rosuvastatin calcium) .[name].. 1 by mouth once daily 9)  Clarithromycin 500 Mg Tabs (Clarithromycin) .[name].. 1 by mouth two times a day  Patient Instructions: 1)  you had the antibiotic (rocephin) and pain shot (toradol) shots today 2)  Please take all new medications as prescribed  3)  Continue all previous medications as before this visit  4)  Please schedule a follow-up appointment in 3 months with CPX labs and :   5)  HbgA1C prior to visit, ICD-9: 250.02 6)  Urine Microalbumin prior to visit, ICD-9: Prescriptions: CLARITHROMYCIN 500 MG TABS (CLARITHROMYCIN) 1 by mouth two times a day  #20 x 0   Entered and Authorized by:   Corwin Levins MD   Signed by:   Corwin Levins MD on 08/12/2009   Method used:   Electronically to        [facility]. [phone]* (retail)       [address]       [place], [place]  [address]       Ph: [phone]       Fax: [phone]   RxID:   [id number]   Appended Document: EAR ACHE AND SORE THROAT/NWS       Medication Administration  Injection # 1:    Medication: Ketorolac-Toradol 15mg     Diagnosis: OTITIS MEDIA, ACUTE, RIGHT (ICD-382.9)    Route: IM    Site: RUOQ gluteus    Exp Date: 01/18/2011    Lot #: [id number]    Mfr: NovaPlus    Patient tolerated injection without complications    Given by: Brenton Grills MA (August 12, 2009 11:56 AM)  Injection # 2:    Medication: Rocephin  250mg     Diagnosis: OTITIS MEDIA, ACUTE, RIGHT (ICD-382.9)    Route: IM    Site: LUOQ gluteus    Exp Date: 11/2011    Lot #: [id number]    Mfr: NovaPlus    Patient tolerated injection  without complications    Given by: Brenton Grills MA (August 12, 2009 11:57  AM)  Orders Added: 1)  Admin of Therapeutic Inj  intramuscular or subcutaneous [96372] 2)  Rocephin  250mg  [J0696] 3)  Ketorolac-Toradol 15mg  [J1885]  Appended Document: EAR ACHE AND SORE THROAT/NWS Administered 1 gram of Rocephin and 30mg  of Toradol

## 2010-02-16 NOTE — Progress Notes (Signed)
Summary: Rx change  Phone Note From Pharmacy   Caller: [facility] [address]. [phone]* Request: Speak with Provider Summary of Call: Have rx for clarithromycin 500mg . Medication is on longterm backorder and is unavailable. Requesting drug change. Initial call taken by: Orlan Leavens RMA,  August 13, 2009 10:05 AM  Follow-up for Phone Call        okm to change to septra ds course Follow-up by: Corwin Levins MD,  August 13, 2009 12:34 PM    New/Updated Medications: SEPTRA DS 800-160 MG TABS (SULFAMETHOXAZOLE-TRIMETHOPRIM) 1 by mouth two times a day Prescriptions: SEPTRA DS 800-160 MG TABS (SULFAMETHOXAZOLE-TRIMETHOPRIM) 1 by mouth two times a day  #20 x 0   Entered and Authorized by:   Corwin Levins MD   Signed by:   Corwin Levins MD on 08/13/2009   Method used:   Electronically to        [facility]. [phone]* (retail)       [address]       [place], [place]  [address]       Ph: [phone]       Fax: [phone]   RxID:   [id number]

## 2010-02-16 NOTE — Progress Notes (Signed)
Summary: Records request from [facility] [place]  Request for records received from [facility]. Request forwarded to [facility]. Dena Chavis  May 11, 2009 11:18 AM

## 2010-02-16 NOTE — Progress Notes (Signed)
Summary: [facility]a Micro  ---- Converted from flag ---- [name] in [facility] advised of same  ---- 11/13/2009 4:01 PM, Corwin Levins MD wrote: ok to hold off on pt coming back in;  should be credited  ---- 11/13/2009 3:54 PM, Margaret Pyle, CMA wrote: Labs called stating that U-dip with Micro was not done either because it was missed or there was not enough specimen. Lab wants to know if MD wants pt to come back to have it done or should she be credited? Please advise  807 [name] ------------------------------

## 2010-02-16 NOTE — Cardiovascular Report (Signed)
Summary: Pre Cath Orders   Pre Cath Orders   Imported By: Roderic Ovens 12/25/2009 14:43:24  _____________________________________________________________________  External Attachment:    Type:   Image     Comment:   External Document

## 2010-02-16 NOTE — Assessment & Plan Note (Signed)
Summary: f83m/dfg   Visit Type:  6 months follow up Primary Provider:  Corwin Levins MD  CC:  Chest pains-Ankles edema.  History of Present Illness: Patient is in for follow up.  She thinks she has another blockage.  She has had several episodes where she feels chest pain, radiates to left arm, plus/minus diaphoresis.  It is similar to her 2007 experience, and hence why she feels she has new disease.  She does take a cola, burps, and it gradually improves.  Denies shortness of breath.  No longer smoking.  Not sleeping well at night.  I suggested sleep study  ---"dont go there".                                                                                                                                                                             Problems Prior to Update: 1)  Otitis Media, Acute, Right  (ICD-382.9) 2)  Urticaria  (ICD-708.9) 3)  Chest Pain  (ICD-786.50) 4)  Wheezing  (ICD-786.07) 5)  Tobacco Abuse  (ICD-305.1) 6)  Coronary Artery Disease  (ICD-414.00) 7)  Hyperlipidemia  (ICD-272.4) 8)  Hypertension  (ICD-401.9) 9)  Obesity, Moderate  (ICD-278.00) 10)  Hypothyroidism  (ICD-244.9) 11)  Diabetes Mellitus, Type II  (ICD-250.00) 12)  Anxiety  (ICD-300.00) 13)  Depression  (ICD-311) 14)  Sinusitis- Acute-nos  (ICD-461.9) 15)  Menopausal Disorder  (ICD-627.9) 16)  Preventive Health Care  (ICD-V70.0) 17)  Asthmatic Bronchitis, Acute  (ICD-466.0) 18)  Urinary Frequency  (ICD-788.41) 19)  Otitis Media, Serous, Chronic  (ICD-381.10) 20)  Dysuria  (ICD-788.1) 21)  Dyspepsia  (ICD-536.8) 22)  Disc Disease, Lumbosacral Spine  (ICD-722.52) 23)  Constipation  (ICD-564.00) 24)  Allergic Rhinitis  (ICD-477.9)  Current Medications (verified): 1)  Levothyroxine Sodium 150 Mcg Tabs (Levothyroxine Sodium) .[name].. 1po Once Daily 2)  Atenolol 25 Mg  Tabs (Atenolol) .[name].. 1 By Mouth  Two Times A Day 3)  Adult Aspirin Ec Low Strength 81 Mg  Tbec (Aspirin) .... Take 1 Tablet By Mouth Once A  Day 4)  Metformin Hcl 500 Mg Xr24h-Tab (Metformin Hcl) .... 3 By Mouth Qam 5)  Hydrocodone-Acetaminophen 10-325 Mg Tabs (Hydrocodone-Acetaminophen) .[name].. 1 By Mouth At Bedtime As Needed 6)  Alprazolam 0.5 Mg  Tabs (Alprazolam) .... 1/2 Toi 1 By Mouth Q 6 Hrs As Needed Nerves 7)  Fexofenadine Hcl 180 Mg Tabs (Fexofenadine Hcl) .[name].. 1po Once Daily As Needed 8)  Hydroxyzine Hcl 25 Mg Tabs (Hydroxyzine Hcl) .[name].. 1 By Mouth Every 6 Hours As Needed For Itch 9)  Crestor 20 Mg Tabs (Rosuvastatin Calcium) .... 1/2 Po Once Daily. On Hold  Allergies: 1)  * Tequin 2)  Codeine 3)  Doxycycline 4)  Monistat 7 (Miconazole Nitrate)  Past History:  Past Medical History: Last updated: 09/23/2009 CORONARY ARTERY DISEASE (ICD-414.00) HYPERLIPIDEMIA (ICD-272.4) HYPERTENSION (ICD-401.9) OBESITY, MODERATE (ICD-278.00) HYPOTHYROIDISM (ICD-244.9) DIABETES MELLITUS, TYPE II (ICD-250.00) ANXIETY (ICD-300.00) DEPRESSION (ICD-311) MENOPAUSAL DISORDER (ICD-627.9) OTITIS MEDIA, SEROUS, CHRONIC (ICD-381.10) DISC DISEASE, LUMBOSACRAL SPINE (ICD-722.52) ALLERGIC RHINITIS (ICD-477.9)  Past Surgical History: Last updated: 2008/12/13 Hysterectomy Oophorectomy Lumbar fusion-3 level with fixation  Jan '09 ([facility] Parathyroidectomy Thyroidectomy  percutaneous coronary intervention of both the circumflex and left anterior descending artery.  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 Ears:  TM's intact and clear with normal canals and hearing Lungs:  Clear bilaterally to auscultation and percussion. Heart:  PMI non displaced.  Normal S1 and s2.  No murmur, rub, or gallop.   Abdomen:  Bowel sounds positive; abdomen soft and non-tender without masses, organomegaly, or hernias noted. No hepatosplenomegaly. Pulses:  pulses normal in all 4 extremities Extremities:  No clubbing or cyanosis. Neurologic:  Alert and oriented x 3.   EKG  Procedure date:  12/01/2005  Findings:       ANGIOGRAPHIC DATA:  1. The right coronary artery is the previous infarct artery.  There      are tandem lesions of about 40% in the proximal mid vessel and 40%      near the acute margin.  Distal vessel consists of a posterior      descending and posterolateral branch.  These are without critical      narrowing.  2. Left main is free of critical disease.  3. The LAD demonstrates about a 20% area of narrowing after the      diagonal takeoff.  The stented site appears to be patent with some      mild healing at the site of the stent.  Distal to this, there is      30% narrowing.  The distal vessel has excellent flow into the      distal vessel.  The diagonal is somewhat diffusely diseased with      tandem areas of about 50%.  However, it does not appear to be      critical.  4. The circumflex provides a large marginal branch and an AV      circumflex, all of which have mild luminal irregularity and some      distal tapering, but no critical stenoses.  5. Ventriculography in the RAO projection reveals overall preserved      systolic function with improved inferior wall motion.  There is not      a definite anterior wall motion abnormality.  Inferior wall was      slightly hypokinetic, but the ejection fraction appears to be 55%      or better.  CONCLUSION:  1. Preserved overall left  ventricular function with mild inferior      hypokinesis.  2. Continued patency of the initial infarct-related artery in the      right coronary territory without critical renarrowing  3. Continued patency of both the LAD and circumflex stents without      significant focal renarrowing.   EKG  Procedure date:  12/01/2009  Findings:      NSR.  Leftward axis deviation.  Impression & Recommendations:  Problem # 1:  CORONARY ARTERY DISEASE (ICD-414.00) Has symptoms which are a bit atypical, but similar to her prior experiences.  Denies progressive symptoms, but is quite worried she has another blockage, and would prefer cath.  Her symptoms mirror what happened in 2007.  Risks reviewed and patient agreeable to proceed.   Her updated medication list for this problem includes:    Atenolol 25 Mg Tabs (Atenolol) .[name]... 1 by mouth  two times a day    Adult Aspirin Ec Low Strength 81 Mg Tbec (Aspirin) .[name]... Take 1 tablet by mouth once a day  Problem # 2:  HYPERLIPIDEMIA (ICD-272.4)  on medical therapy. Her updated medication list for this problem includes:    Crestor 20 Mg Tabs (Rosuvastatin calcium) .[name]... 1/2 po once daily. on hold  Her updated medication list for this problem includes:    Crestor 20 Mg Tabs (Rosuvastatin calcium) .[name]... 1/2 po once daily. on hold  Problem # 3:  HYPERTENSION (ICD-401.9)  controlled on fairly low dose medication. Her updated medication list for this problem includes:    Atenolol 25 Mg Tabs (Atenolol) .[name]... 1 by mouth  two times a day    Adult Aspirin Ec Low Strength 81 Mg Tbec (Aspirin) .[name]... Take 1 tablet by mouth once a day  Her updated medication list for this problem includes:    Atenolol 25 Mg Tabs (Atenolol) .[name]... 1 by mouth  two times a day    Adult Aspirin Ec Low Strength 81 Mg Tbec (Aspirin) .[name]... Take 1 tablet by mouth once a day  Other Orders: TLB-BMP (Basic Metabolic Panel-BMET) (80048-METABOL) TLB-CBC Platelet - w/Differential  (85025-CBCD) TLB-PT (Protime) (85610-PTP) TLB-PTT (85730-PTTL)  Patient Instructions: 1)  Your physician recommends that you schedule a follow-up appointment in: 6 weeks 2)  Your physician recommends that you return for lab work in: tyoday--BMET,CBC,PT,PTT 3)  Your physician has requested that you have a cardiac catheterization.  Cardiac catheterization is used to diagnose and/or treat various heart conditions. Doctors may recommend this procedure for a number of different reasons. The most common reason is to evaluate chest pain. Chest pain can be a symptom of coronary artery disease (CAD), and cardiac catheterization can show whether plaque is narrowing or blocking your heart's arteries. This procedure is also used to evaluate the valves, as well as measure the blood flow and oxygen levels in different parts of your heart.  For further information please visit [email or web address]  Please follow instruction sheet, as given.

## 2010-02-16 NOTE — Assessment & Plan Note (Signed)
Summary: 6 mos f/u #/cd   Vital Signs:  Patient profile:   65 year old female Height:      66 inches Weight:      238.13 pounds BMI:     38.57 O2 Sat:      93 % on Room air Temp:     99.2 degrees F oral Pulse rate:   85 / minute BP sitting:   102 / 70  (left arm) Cuff size:   large  Vitals Entered ByZella Ball Ewing (May 01, 2009 1:52 PM)  O2 Flow:  Room air CC: 6 Month followup/RE   Primary Care Provider:  Corwin Levins MD  CC:  6 Month followup/RE.  History of Present Illness: here with left chest pain , off an on , 4 wks, "in between" sharp and dull, sometimes asoc left shoulder and upper arm discomfort; no n/v, diaphoresis, palps' or dizziness;  non exertional;  had some soreness to the area a few days ago now improved; nonpleuritic;  not assoc with cough;  was better it seemed to support the left arm and shoudler just the right way on the pillow.    Wants ears and throat checked with temp today;  has mild prod cough yellowish green ; assoc with St on top of allergy symptosm worse in the past few wks;    Problems Prior to Update: 1)  Chest Pain  (ICD-786.50) 2)  Sinusitis- Acute-nos  (ICD-461.9) 3)  Wheezing  (ICD-786.07) 4)  Tobacco Abuse  (ICD-305.1) 5)  Coronary Artery Disease  (ICD-414.00) 6)  Hyperlipidemia  (ICD-272.4) 7)  Hypertension  (ICD-401.9) 8)  Obesity, Moderate  (ICD-278.00) 9)  Hypothyroidism  (ICD-244.9) 10)  Diabetes Mellitus, Type II  (ICD-250.00) 11)  Anxiety  (ICD-300.00) 12)  Depression  (ICD-311) 13)  Sinusitis- Acute-nos  (ICD-461.9) 14)  Menopausal Disorder  (ICD-627.9) 15)  Preventive Health Care  (ICD-V70.0) 16)  Asthmatic Bronchitis, Acute  (ICD-466.0) 17)  Urinary Frequency  (ICD-788.41) 18)  Otitis Media, Serous, Chronic  (ICD-381.10) 19)  Dysuria  (ICD-788.1) 20)  Dyspepsia  (ICD-536.8) 21)  Disc Disease, Lumbosacral Spine  (ICD-722.52) 22)  Constipation  (ICD-564.00) 23)  Allergic Rhinitis  (ICD-477.9)  Medications Prior to  Update: 1)  Synthroid 150 Mcg  Tabs (Levothyroxine Sodium) .[name].. 1 By Mouth Once Daily (Daw) 2)  Plavix 75 Mg  Tabs (Clopidogrel Bisulfate) .[name].. 1 By Mouth Once Daily (Out) 3)  Atenolol 25 Mg  Tabs (Atenolol) .[name].. 1 By Mouth  Two Times A Day 4)  Adult Aspirin Ec Low Strength 81 Mg  Tbec (Aspirin) .... Take 1 Tablet By Mouth Once A Day 5)  Metformin Hcl 500 Mg  Tabs (Metformin Hcl) .... Take 2 By Mouth Qam 6)  Hydrocodone-Acetaminophen 10-325 Mg Tabs (Hydrocodone-Acetaminophen) .[name].. 1 By Mouth At Bedtime As Needed 7)  Alprazolam 0.5 Mg  Tabs (Alprazolam) .... 1/2 Toi 1 By Mouth Q 6 Hrs As Needed Nerves 8)  Cetirizine Hcl 10 Mg  Tabs (Cetirizine Hcl) .[name].. 1 By Mouth Once Daily As Needed Allergy 9)  Crestor 20 Mg Tabs (Rosuvastatin Calcium) .[name].. 1 By Mouth At Bedtime 10)  Cephalexin 500 Mg Caps (Cephalexin) .[name].. 1 By Mouth Tid 11)  Prednisone 10 Mg Tabs (Prednisone) .... 4po Qd For 3days, Then 3po Qd For 3days, Then 2po Qd For 3days, Then 1po Qd For 3 Days, Then Stop  Current Medications (verified): 1)  Synthroid 150 Mcg  Tabs (Levothyroxine Sodium) .[name].. 1 By Mouth Once Daily (Daw) 2)  Plavix  75 Mg  Tabs (Clopidogrel Bisulfate) .[name].. 1 By Mouth Once Daily (Out) 3)  Atenolol 25 Mg  Tabs (Atenolol) .[name].. 1 By Mouth  Two Times A Day 4)  Adult Aspirin Ec Low Strength 81 Mg  Tbec (Aspirin) .... Take 1 Tablet By Mouth Once A Day 5)  Metformin Hcl 500 Mg Xr24h-Tab (Metformin Hcl) .... 3 By Mouth Qam 6)  Hydrocodone-Acetaminophen 10-325 Mg Tabs (Hydrocodone-Acetaminophen) .[name].. 1 By Mouth At Bedtime As Needed 7)  Alprazolam 0.5 Mg  Tabs (Alprazolam) .... 1/2 Toi 1 By Mouth Q 6 Hrs As Needed Nerves 8)  Fexofenadine Hcl 180 Mg Tabs (Fexofenadine Hcl) .[name].. 1po Once Daily As Needed 9)  Crestor 5 Mg Tabs (Rosuvastatin Calcium) .[name].. 1 By Mouth Once Daily 10)  Azithromycin 250 Mg Tabs (Azithromycin) .... 2po Qd For 1 Day, Then 1po Qd For 4days, Then Stop  Allergies (verified): 1)  * Tequin 2)  Codeine 3)   Doxycycline  Past History:  Past Medical History: Last updated: 2008/11/18 Current Problems:  CORONARY ARTERY DISEASE (ICD-414.00) HYPERLIPIDEMIA (ICD-272.4) HYPERTENSION (ICD-401.9) OBESITY, MODERATE (ICD-278.00) HYPOTHYROIDISM (ICD-244.9) DIABETES MELLITUS, TYPE II (ICD-250.00) ANXIETY (ICD-300.00) DEPRESSION (ICD-311) SINUSITIS- ACUTE-NOS (ICD-461.9) MENOPAUSAL DISORDER (ICD-627.9) PREVENTIVE HEALTH CARE (ICD-V70.0) ASTHMATIC BRONCHITIS, ACUTE (ICD-466.0) URINARY FREQUENCY (ICD-788.41) OTITIS MEDIA, SEROUS, CHRONIC (ICD-381.10) DYSURIA (ICD-788.1) DYSPEPSIA (ICD-536.8) DISC DISEASE, LUMBOSACRAL SPINE (ICD-722.52) CONSTIPATION (ICD-564.00) ALLERGIC RHINITIS (ICD-477.9)  Past Surgical History: Last updated: Nov 18, 2008 Hysterectomy Oophorectomy Lumbar fusion-3 level with fixation  Jan '09 ([facility] Parathyroidectomy Thyroidectomy  percutaneous coronary intervention of both the circumflex and left anterior descending artery.  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   Head:  normocephalic and atraumatic.   Eyes:  vision grossly intact, pupils equal, and pupils round.   Ears:  right tm mild erythema, left TM oK without erythema or bulging;  sinus tender bilat Nose:  nasal dischargemucosal pallor and mucosal edema.   Mouth:  pharyngeal erythema and fair dentition.   Neck:  supple and no masses.   Lungs:  normal respiratory effort and normal breath sounds.   Heart:  normal rate and regular rhythm.   Abdomen:  soft, non-tender, and normal bowel sounds.   Msk:  + tender left ant chest wall,  left shoudler NT with FROM, no swelling,  left trapezoid and neck without tenderness, swelling or rash Extremities:  no edema, no erythema    Impression & Recommendations:  Problem # 1:  CHEST PAIN (ICD-786.50)  most c/w MSK it seems but cant r/o cardiac ; to check cxr, ecg today; I strongly recommended adenosine stress test but she refuses, has appt with dr Riley Kill may 3  Orders: EKG w/ Interpretation (93000) T-2 View CXR, Same Day (71020.5TC)  Problem # 2:  SINUSITIS- ACUTE-NOS (ICD-461.9)  The following medications were removed from the medication list:    Cephalexin 500 Mg Caps (Cephalexin) .[name]... 1 by mouth tid Her updated medication list for this problem includes:    Azithromycin 250 Mg Tabs (Azithromycin) .[name]... 2po qd for 1 day, then 1po qd for 4days, then stop treat as above, f/u any worsening signs or symptoms  Problem # 3:  ALLERGIC RHINITIS (ICD-477.9)  Her updated medication list for this problem includes:    Fexofenadine Hcl 180 Mg Tabs (Fexofenadine hcl) .[name]... 1po once daily as needed treat as above, f/u any worsening signs or symptoms   Orders: Depo- Medrol 40mg  (J1030) Depo- Medrol 80mg  (J1040) Admin of Therapeutic Inj  intramuscular or subcutaneous (16109) for depo shot today for marked uncontrolled symptoms prior to more acute sinusitis;  treat as above, f/u any worsening signs or symptoms   Problem # 4:  HYPERLIPIDEMIA (ICD-272.4)  Her updated medication list for this problem includes:    Crestor 5 Mg Tabs (Rosuvastatin calcium) .[name]... 1 by mouth once daily  Labs Reviewed: SGOT: 39 (12/01/2008)   SGPT: 46 (12/01/2008)   HDL:33.10 (04/24/2009), 28.70 (12/01/2008)  LDL:94 (04/24/2009), 120 (60/45/4098)  Chol:161 (04/24/2009), 169 (12/01/2008)  Trig:171.0 (04/24/2009), 102.0 (12/01/2008) for low dose crestor - goal ldl < 70,, Pt to continue diet efforts, good med tolerance; to check labs as planned - goal LDL less than 70   Problem # 5:  HYPERTENSION  (ICD-401.9)  Her updated medication list for this problem includes:    Atenolol 25 Mg Tabs (Atenolol) .[name]... 1 by mouth  two times a day  BP today: 102/70 Prior BP: 110/68 (12/19/2008)  Labs Reviewed: K+: 4.0 (04/24/2009) Creat: : 0.7 (04/24/2009)   Chol: 161 (04/24/2009)   HDL: 33.10 (04/24/2009)   LDL: 94 (04/24/2009)   TG: 171.0 (04/24/2009) stable overall by hx and exam, ok to continue meds/tx as is   Problem # 6:  DIABETES MELLITUS, TYPE II (ICD-250.00)  Her updated medication list for this problem includes:    Adult Aspirin Ec Low Strength 81 Mg Tbec (Aspirin) .[name]... Take 1 tablet by mouth once a day    Metformin Hcl 500 Mg Xr24h-tab (Metformin hcl) .Marland KitchenMarland KitchenMarland KitchenMarland Kitchen 3 by mouth qam  Labs Reviewed: Creat: 0.7 (04/24/2009)    Reviewed HgBA1c results: 7.1 (04/24/2009)  6.6 (10/23/2008) increase metformin as above, f/u labs next visit  Complete Medication List: 1)  Synthroid 150 Mcg Tabs (Levothyroxine sodium) .[name].. 1 by mouth once daily (daw) 2)  Plavix 75 Mg Tabs (Clopidogrel bisulfate) .[name].. 1 by mouth once daily (out) 3)  Atenolol 25 Mg Tabs (Atenolol) .[name].. 1 by mouth  two times a day 4)  Adult Aspirin Ec Low Strength 81 Mg Tbec (Aspirin) .... Take 1 tablet by mouth once a day 5)  Metformin Hcl 500 Mg Xr24h-tab (Metformin hcl) .... 3 by mouth qam 6)  Hydrocodone-acetaminophen 10-325 Mg Tabs (Hydrocodone-acetaminophen) .[name].. 1 by mouth at bedtime as needed 7)  Alprazolam 0.5 Mg Tabs (Alprazolam) .... 1/2 toi 1 by mouth q 6 hrs as needed nerves 8)  Fexofenadine Hcl 180 Mg Tabs (Fexofenadine hcl) .[name].. 1po once daily as needed 9)  Crestor 5 Mg Tabs (Rosuvastatin calcium) .[name].. 1 by mouth once daily 10)  Azithromycin 250 Mg Tabs (Azithromycin) .... 2po qd for 1 day, then 1po qd for 4days, then stop  Patient Instructions: 1)  increase/change the metformin you have to 2 in th AM , and 1 in the PM;  after that is out, start the new metformin generic ER withy 3 in the AM 2)  please start the  crestor 5 mg in the evening 3)  change the generic zyrtec to generic allegra (fexofenadine) 4)  Please take all new medications as prescribed - the antibiotic 5)  You can also use Mucinex OTC or it's generic for congestion  6)  Your EKG was [facility]  today 7)  Please go to Radiology in the basement level for your X-Ray today  8)  Continue all previous medications as before this visit , including the hydrocodone for your back pain as needed  9)  Please schedule a follow-up appointment in 6 months with CPX labs and: 10)  HbgA1C prior to visit, ICD-9: 250.02 11)  Urine Microalbumin prior to visit, ICD-9:     (or sooner if needed) Prescriptions: HYDROCODONE-ACETAMINOPHEN 10-325 MG TABS (HYDROCODONE-ACETAMINOPHEN) 1 by mouth at bedtime as needed  #30 x 3   Entered and Authorized by:   Corwin Levins MD   Signed by:   Corwin Levins MD on 05/01/2009   Method used:   Print then Give to Patient   RxID:   [id number] FEXOFENADINE HCL 180 MG TABS (FEXOFENADINE HCL) 1po once daily as needed  #30 x 11   Entered and Authorized by:   Corwin Levins MD   Signed by:   Corwin Levins MD on 05/01/2009   Method used:   Print then Give to Patient   RxID:   [id number] AZITHROMYCIN 250 MG TABS (AZITHROMYCIN) 2po qd for 1 day, then 1po qd for 4days, then stop  #6 x 1   Entered and Authorized by:   Corwin Levins MD   Signed by:   Corwin Levins MD on 05/01/2009   Method used:   Print then Give to Patient   RxID:   [id number] METFORMIN HCL 500 MG XR24H-TAB (METFORMIN HCL) 3 by mouth qam  #270 x 3   Entered and Authorized by:   Corwin Levins MD   Signed by:   Corwin Levins MD on 05/01/2009   Method used:   Print then Give to Patient   RxID:   [id number] CRESTOR 5 MG TABS (ROSUVASTATIN CALCIUM) 1 by mouth once daily  #30 x 11   Entered and Authorized by:   Corwin Levins MD   Signed by:   Corwin Levins MD on 05/01/2009   Method used:   Print then Give to Patient   RxID:   [id number]    Medication  Administration  Injection # 1:    Medication: Depo- Medrol 40mg     Diagnosis: ALLERGIC RHINITIS (ICD-477.9)    Route: IM    Site: RUOQ gluteus    Exp Date: 11/2011    Lot #: [id number]    Mfr: [employer]    Given by: Zella Ball Ewing (May 01, 2009 3:14 PM)  Injection # 2:    Medication: Depo- Medrol 80mg     Diagnosis: ALLERGIC RHINITIS (ICD-477.9)    Route: IM    Site: RUOQ gluteus    Exp Date: 11/2011    Lot #: [id number]    Mfr: [employer]    Given by: Zella Ball Ewing (May 01, 2009 3:14 PM)  Orders Added: 1)  EKG w/ Interpretation [93000] 2)  Depo- Medrol 40mg  [J1030] 3)  Depo- Medrol 80mg  [J1040] 4)  Admin of Therapeutic Inj  intramuscular or subcutaneous [96372] 5)  T-2 View CXR, Same Day [71020.5TC] 6)  Est. Patient Level IV [30160]

## 2010-02-18 NOTE — Progress Notes (Signed)
Summary: Rx refill req  Phone Note Refill Request Message from:  Patient on January 15, 2010 9:11 AM  Refills Requested: Medication #1:  LEVOTHYROXINE SODIUM 150 MCG TABS 1po once daily   Dosage confirmed as above?Dosage Confirmed   Brand Name Necessary? Yes   Supply Requested: 9 months  Method Requested: Electronic Initial call taken by: Margaret Pyle, CMA,  January 15, 2010 9:11 AM    Prescriptions: LEVOTHYROXINE SODIUM 150 MCG TABS (LEVOTHYROXINE SODIUM) 1po once daily Brand medically necessary #90 x 3   Entered by:   Margaret Pyle, CMA   Authorized by:   Corwin Levins MD   Signed by:   Margaret Pyle, CMA on 01/15/2010   Method used:   Electronically to        [facility]. [phone]* (retail)       [address]       [place], [place]  [address]       Ph: [phone]       Fax: [phone]   RxID:   [id number]

## 2010-02-18 NOTE — Assessment & Plan Note (Signed)
Summary: eph/mt   Visit Type:  Post-hospital Primary Provider:  Corwin Levins MD  CC:  No complaints.  History of Present Illness: Patient in for follow up.  She still smokes some, but not alot.  Went to ER because of swelling in the knee.  Had aspiration.  Had doppler of LE and had no evidence of DVT.  No chest pain.    Problems Prior to Update: 1)  Otitis Media, Acute, Right  (ICD-382.9) 2)  Urticaria  (ICD-708.9) 3)  Chest Pain  (ICD-786.50) 4)  Wheezing  (ICD-786.07) 5)  Tobacco Abuse  (ICD-305.1) 6)  Coronary Artery Disease  (ICD-414.00) 7)  Hyperlipidemia  (ICD-272.4) 8)  Hypertension  (ICD-401.9) 9)  Obesity, Moderate  (ICD-278.00) 10)  Hypothyroidism  (ICD-244.9) 11)  Diabetes Mellitus, Type II  (ICD-250.00) 12)  Anxiety  (ICD-300.00) 13)  Depression  (ICD-311) 14)  Sinusitis- Acute-nos  (ICD-461.9) 15)  Menopausal Disorder  (ICD-627.9) 16)  Preventive Health Care  (ICD-V70.0) 17)  Asthmatic Bronchitis, Acute  (ICD-466.0) 18)  Urinary Frequency  (ICD-788.41) 19)  Otitis Media, Serous, Chronic  (ICD-381.10) 20)  Dysuria  (ICD-788.1) 21)  Dyspepsia  (ICD-536.8) 22)  Disc Disease, Lumbosacral Spine  (ICD-722.52) 23)  Constipation  (ICD-564.00) 24)  Allergic Rhinitis  (ICD-477.9)  Current Medications (verified): 1)  Levothyroxine Sodium 150 Mcg Tabs (Levothyroxine Sodium) .[name].. 1po Once Daily 2)  Atenolol 25 Mg  Tabs (Atenolol) .[name].. 1 By Mouth  Two Times A Day 3)  Adult Aspirin Ec Low Strength 81 Mg  Tbec (Aspirin) .... Take 1 Tablet By Mouth Once A Day 4)  Metformin Hcl 500 Mg Xr24h-Tab (Metformin Hcl) .... 3 By Mouth Qam 5)  Hydrocodone-Acetaminophen 10-325 Mg Tabs (Hydrocodone-Acetaminophen) .[name].. 1 By Mouth At Bedtime As Needed 6)  Alprazolam 0.5 Mg  Tabs (Alprazolam) .... 1/2 Toi 1 By Mouth Q 6 Hrs As Needed Nerves 7)  Fexofenadine Hcl 180 Mg Tabs (Fexofenadine Hcl) .[name].. 1po Once Daily As Needed 8)  Hydroxyzine Hcl 25 Mg Tabs (Hydroxyzine Hcl) .[name].. 1 By Mouth Every  6 Hours As Needed For Itch 9)  Crestor 20 Mg Tabs (Rosuvastatin Calcium) .... 1/2 Po Once Daily. On Hold  Allergies: 1)  * Tequin 2)  Doxycycline 3)  Monistat 7 (Miconazole Nitrate)  Vital Signs:  Patient profile:   65 year old female Height:      66 inches Weight:      232.50 pounds BMI:     37.66 Pulse rate:   76 / minute Pulse rhythm:   regular Resp:     18 per minute BP sitting:   94 / 66  (left arm) Cuff size:   large  Vitals Entered By: Vikki Ports (January 13, 2010 4:00 PM)  Physical Exam  General:  Well developed, well nourished, in no acute distress. Head:  normocephalic and atraumatic Eyes:  PERRLA/EOM intact; conjunctiva and lids normal. Lungs:  Clear bilaterally to auscultation and percussion. Extremities:  Left groin normal. R knee without significant effusion.    Neurologic:  Alert and oriented x 3.   Cardiac Cath  Procedure date:  12/10/2009  Findings:      CONCLUSIONS: 1. Well-preserved left ventricular function without segmental wall     motion abnormality. 2. Distal aortogram with some tortuosity, cannot exclude small     abdominal aortic aneurysm. 3. Continued patency with LAD stent previously placed. 4. Continued patency of the circumflex stent with some eccentric     plaquing just beyond the distal end of the  stent but without     significant high-grade focal compromise. 5. Mild irregularities in the right coronary artery.   DISPOSITION:  Based upon the angiographic findings, my leaning would be in the direction of continued medical therapy.  Discontinuation of smoking and control of weight with regular exercise will be strongly recommended and has been repeatedly discussed with the patient.  We will see her back in followup in a couple of weeks.  Impression & Recommendations:  Problem # 1:  CORONARY ARTERY DISEASE (ICD-414.00) See cath report.  Will continue  Her updated medication list for this problem includes:    Atenolol 25 Mg  Tabs (Atenolol) .[name]... 1 by mouth  two times a day    Adult Aspirin Ec Low Strength 81 Mg Tbec (Aspirin) .[name]... Take 1 tablet by mouth once a day  Problem # 2:  HYPERLIPIDEMIA (ICD-272.4) Will resume crestor january 1 and recheck in six weeks. Her updated medication list for this problem includes:    Crestor 20 Mg Tabs (Rosuvastatin calcium) .[name]... Take one tablet by mouth daily.  Problem # 3:  abnormal cath finding ultrasound to rule out anuerysm.,  Other Orders: EKG w/ Interpretation (93000)  Patient Instructions: 1)  Your physician recommends that you schedule a follow-up appointment in: 3 months 2)  Your physician recommends that you continue on your current medications as directed. Please refer to the Current Medication list given to you today. PLEASE RESTART CRESTOR 20mg  by mouth daily at night.   Appended Document: eph/mt Lauren Can you schedule her for an abdominal US to rule out aneurysm based on cath.  Thanks. (note from Dr Riley Kill)  This pt is scheduled for duplex of Aorta on 02/05/10.

## 2010-02-18 NOTE — Progress Notes (Signed)
  Phone Note Other Incoming   Caller: pt. Summary of Call: PT CALLED TO SCHEDULE ABD U/S  SEE OFFICE NOTE FROM 01/13/10. Initial call taken by: Scherrie Bateman, LPN,  January 14, 2010 10:17 AM  New Problems: AAA (ICD-441.4)   New Problems: AAA (ICD-441.4)

## 2010-02-22 ENCOUNTER — Encounter: Payer: Self-pay | Admitting: Internal Medicine

## 2010-03-04 NOTE — Letter (Signed)
Summary: Sharolyn Douglas MD/[facility]  Max Noel Gerold MD/[facility]   Imported By: Lester [place] 02/26/2010 16:22:33  _____________________________________________________________________  External Attachment:    Type:   Image     Comment:   External Document

## 2010-03-15 ENCOUNTER — Ambulatory Visit (INDEPENDENT_AMBULATORY_CARE_PROVIDER_SITE_OTHER): Payer: Medicare Other | Admitting: Internal Medicine

## 2010-03-15 ENCOUNTER — Encounter: Payer: Self-pay | Admitting: Internal Medicine

## 2010-03-15 DIAGNOSIS — H6690 Otitis media, unspecified, unspecified ear: Secondary | ICD-10-CM

## 2010-03-15 DIAGNOSIS — E119 Type 2 diabetes mellitus without complications: Secondary | ICD-10-CM

## 2010-03-15 DIAGNOSIS — E785 Hyperlipidemia, unspecified: Secondary | ICD-10-CM

## 2010-03-15 DIAGNOSIS — I10 Essential (primary) hypertension: Secondary | ICD-10-CM

## 2010-03-25 NOTE — Assessment & Plan Note (Signed)
Summary: 4 MO F/U # /CD   Vital Signs:  Patient profile:   65 year old female Height:      66 inches Weight:      203 pounds BMI:     32.88 O2 Sat:      94 % on Room air Temp:     98.3 degrees F oral Pulse rate:   74 / minute BP sitting:   100 / 70  (left arm) Cuff size:   large  Vitals Entered By: Zella Ball Ewing CMA (AAMA) (March 15, 2010 11:09 AM)  O2 Flow:  Room air CC: 4 month followup/RE   Primary Care Provider:  Corwin Levins MD  CC:  4 month followup/RE.  History of Present Illness: here with acute osnet 3 days midl to mod facial pain, pressure, fever and grenish d/c, mild bialt ear ache, and slight ST, but Pt denies CP, worsening sob, doe, wheezing, orthopnea, pnd, worsening LE edema, palps, dizziness or syncope  Pt denies new neuro symptoms such as headache, facial or extremity weakness  Pt denies polydipsia, polyuria, or low sugar symptoms such as shakiness improved with eating.  Overall good compliance with meds, trying to follow low chol, DM diet, wt stable, little excercise however   No recent  wt loss, night sweats, loss of appetite or other constitutional symptoms  .  Overall good compliance with meds, and good tolerability.  Denies worsening depressive symptoms, suicidal ideation, or panim though does have ongoing anxiety.   Preventive Screening-Counseling & Management      Drug Use:  no.    Problems Prior to Update: 1)  Aaa  (ICD-441.4) 2)  Otitis Media, Acute, Right  (ICD-382.9) 3)  Urticaria  (ICD-708.9) 4)  Chest Pain  (ICD-786.50) 5)  Wheezing  (ICD-786.07) 6)  Tobacco Abuse  (ICD-305.1) 7)  Coronary Artery Disease  (ICD-414.00) 8)  Hyperlipidemia  (ICD-272.4) 9)  Hypertension  (ICD-401.9) 10)  Obesity, Moderate  (ICD-278.00) 11)  Hypothyroidism  (ICD-244.9) 12)  Diabetes Mellitus, Type II  (ICD-250.00) 13)  Anxiety  (ICD-300.00) 14)  Depression  (ICD-311) 15)  Sinusitis- Acute-nos  (ICD-461.9) 16)  Menopausal Disorder  (ICD-627.9) 17)  Preventive  Health Care  (ICD-V70.0) 18)  Asthmatic Bronchitis, Acute  (ICD-466.0) 19)  Urinary Frequency  (ICD-788.41) 20)  Otitis Media, Serous, Chronic  (ICD-381.10) 21)  Dysuria  (ICD-788.1) 22)  Dyspepsia  (ICD-536.8) 23)  Disc Disease, Lumbosacral Spine  (ICD-722.52) 24)  Constipation  (ICD-564.00) 25)  Allergic Rhinitis  (ICD-477.9)  Medications Prior to Update: 1)  Levothyroxine Sodium 150 Mcg Tabs (Levothyroxine Sodium) .[name].. 1po Once Daily 2)  Atenolol 25 Mg  Tabs (Atenolol) .[name].. 1 By Mouth  Two Times A Day 3)  Adult Aspirin Ec Low Strength 81 Mg  Tbec (Aspirin) .... Take 1 Tablet By Mouth Once A Day 4)  Metformin Hcl 500 Mg Xr24h-Tab (Metformin Hcl) .... 3 By Mouth Qam 5)  Hydrocodone-Acetaminophen 10-325 Mg Tabs (Hydrocodone-Acetaminophen) .[name].. 1 By Mouth At Bedtime As Needed - To Fill Feb 9. 2012 6)  Alprazolam 0.5 Mg  Tabs (Alprazolam) .... 1/2 Toi 1 By Mouth Q 6 Hrs As Needed Nerves 7)  Fexofenadine Hcl 180 Mg Tabs (Fexofenadine Hcl) .[name].. 1po Once Daily As Needed 8)  Hydroxyzine Hcl 25 Mg Tabs (Hydroxyzine Hcl) .[name].. 1 By Mouth Every 6 Hours As Needed For Itch 9)  Crestor 20 Mg Tabs (Rosuvastatin Calcium) .... Take One Tablet By Mouth Daily.  Current Medications (verified): 1)  Levothyroxine Sodium 150 Mcg Tabs (  Levothyroxine Sodium) .[name].. 1po Once Daily 2)  Atenolol 25 Mg  Tabs (Atenolol) .[name].. 1 By Mouth  Two Times A Day 3)  Adult Aspirin Ec Low Strength 81 Mg  Tbec (Aspirin) .... Take 1 Tablet By Mouth Once A Day 4)  Metformin Hcl 500 Mg Xr24h-Tab (Metformin Hcl) .... 3 By Mouth Qam 5)  Hydrocodone-Acetaminophen 10-325 Mg Tabs (Hydrocodone-Acetaminophen) .[name].. 1 By Mouth At Bedtime As Needed - To Fill Feb 9. 2012 6)  Fexofenadine Hcl 180 Mg Tabs (Fexofenadine Hcl) .[name].. 1po Once Daily As Needed 7)  Hydroxyzine Hcl 25 Mg Tabs (Hydroxyzine Hcl) .[name].. 1 By Mouth Every 6 Hours As Needed For Itch 8)  Crestor 20 Mg Tabs (Rosuvastatin Calcium) .... Take One Tablet By Mouth Daily. 9)   Amoxicillin 500 Mg Caps (Amoxicillin) .... 2 By Mouth Two Times A Day 10)  Fluconazole 150 Mg Tabs (Fluconazole) .[name].. 1 By Mouth Every Third Day As Needed  Allergies (verified): 1)  * Tequin 2)  Doxycycline 3)  Monistat 7 (Miconazole Nitrate)  Past History:  Past Medical History: Last updated: 09/23/2009 CORONARY ARTERY DISEASE (ICD-414.00) HYPERLIPIDEMIA (ICD-272.4) HYPERTENSION (ICD-401.9) OBESITY, MODERATE (ICD-278.00) HYPOTHYROIDISM (ICD-244.9) DIABETES MELLITUS, TYPE II (ICD-250.00) ANXIETY (ICD-300.00) DEPRESSION (ICD-311) MENOPAUSAL DISORDER (ICD-627.9) OTITIS MEDIA, SEROUS, CHRONIC (ICD-381.10) DISC DISEASE, LUMBOSACRAL SPINE (ICD-722.52) ALLERGIC RHINITIS (ICD-477.9)  Past Surgical History: Last updated: 11/17/2008 Hysterectomy Oophorectomy Lumbar fusion-3 level with fixation  Jan '09 ([facility] Parathyroidectomy Thyroidectomy  percutaneous coronary intervention of both the circumflex and left anterior descending artery.  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   Eyes:  vision grossly intact, pupils equal, and pupils round.   Ears:  right tm mod erythema, non buligng, left TM with erythema, canals clear, sinus nontender Nose:  nasal dischargemucosal pallor and mucosal edema.   Mouth:  pharyngeal erythema and fair dentition.   Neck:  supple and no masses.   Lungs:  normal respiratory effort and normal breath sounds.   Heart:  normal rate and regular rhythm.   Extremities:  no edema, no erythema     Impression & Recommendations:  Problem # 1:  OTITIS MEDIA, BILATERAL (ICD-382.9)  Her updated medication list for this problem includes:    Adult Aspirin Ec Low Strength 81 Mg Tbec (Aspirin) .[name]... Take 1 tablet by mouth once a day    Amoxicillin 500 Mg Caps (Amoxicillin) .[name]... 2 by mouth two times a day treat as above, f/u any worsening signs or symptoms   Instructed on prevention and treatment. Call if no improvement in 48-72 hours or sooner if worsening symptoms.   Problem # 2:  HYPERLIPIDEMIA (ICD-272.4)  Her updated medication list for this problem includes:    Crestor 20 Mg Tabs (Rosuvastatin calcium) .[name]... Take one tablet by mouth daily.  Labs Reviewed: SGOT: 37 (11/06/2009)   SGPT: 40 (11/06/2009)   HDL:32.60 (11/06/2009), 33.10 (04/24/2009)  LDL:112 (11/06/2009), 94 (01/19/7251)  Chol:168 (11/06/2009), 161 (04/24/2009)  Trig:117.0 (11/06/2009), 171.0 (04/24/2009) stable overall by hx and exam, ok to continue meds/tx as is , Pt to  continue diet efforts, good med tolerance;  - goal LDL less than 70   Problem # 3:  HYPERTENSION (ICD-401.9)  Her updated medication list for this problem includes:    Atenolol 25 Mg Tabs (Atenolol) .[name]... 1 by mouth  two times a day  BP today: 100/70 Prior BP: 94/66 (01/13/2010)  Labs Reviewed: K+: 4.3 (12/01/2009) Creat: : 0.9 (12/01/2009)   Chol: 168 (11/06/2009)   HDL: 32.60 (11/06/2009)   LDL: 112 (11/06/2009)   TG: 117.0 (11/06/2009) stable overall by hx and exam, ok to continue meds/tx as is   Problem # 4:  DIABETES MELLITUS, TYPE II (ICD-250.00)  Her updated medication list for this problem includes:    Adult Aspirin Ec Low Strength 81 Mg Tbec (Aspirin) .[name]... Take 1 tablet by mouth once a day    Metformin Hcl 500 Mg Xr24h-tab (Metformin hcl) .Marland KitchenMarland KitchenMarland KitchenMarland Kitchen 3 by mouth qam  Labs Reviewed: Creat: 0.9 (12/01/2009)    Reviewed HgBA1c results: 7.0 (11/06/2009)  7.1 (04/24/2009) stable overall by hx and exam, ok to continue meds/tx as is    Complete Medication List: 1)  Levothyroxine Sodium 150 Mcg Tabs (Levothyroxine sodium) .[name].. 1po once daily 2)  Atenolol 25 Mg Tabs (Atenolol) .[name].. 1 by mouth  two times a day 3)  Adult Aspirin Ec Low Strength 81 Mg Tbec (Aspirin) .... Take 1 tablet by mouth once a day 4)  Metformin Hcl 500 Mg Xr24h-tab (Metformin hcl) .... 3 by mouth qam 5)  Hydrocodone-acetaminophen 10-325 Mg Tabs (Hydrocodone-acetaminophen) .[name].. 1 by mouth at bedtime as needed - to fill feb 9. 2012 6)  Fexofenadine Hcl 180 Mg Tabs (Fexofenadine hcl) .[name].. 1po once daily as needed 7)  Hydroxyzine Hcl 25 Mg Tabs (Hydroxyzine hcl) .[name].. 1 by mouth every 6 hours as needed for itch 8)  Crestor 20 Mg Tabs (Rosuvastatin calcium) .... Take one tablet by mouth daily. 9)  Amoxicillin 500 Mg Caps (Amoxicillin) .... 2 by mouth two times a day 10)  Fluconazole 150 Mg Tabs (Fluconazole) .[name].. 1 by mouth every third day as needed  Patient Instructions: 1)  Please take all new medications as prescribed 2)  Continue all previous medications as before this visit  3)  Please schedule a follow-up appointment in 6 months. Prescriptions: FLUCONAZOLE 150 MG TABS (FLUCONAZOLE) 1 by mouth every third day as needed  #2 x 1   Entered and Authorized by:   Corwin Levins MD   Signed by:   Corwin Levins MD on 03/15/2010   Method used:   Electronically to        [facility]. [phone]* (retail)       [address]       [place], [place]  [address]       Ph: [phone]       Fax: [phone]   RxID:   [id number] AMOXICILLIN 500 MG CAPS (AMOXICILLIN) 2 by mouth two times a day  #40 x 0   Entered and Authorized by:   Corwin Levins MD   Signed by:   Corwin Levins MD on 03/15/2010   Method used:   Electronically to        [facility]. [phone]* (retail)       [address]       [place], [place]  [address]       Ph: [phone]  Fax: [phone]   RxID:    [id number]    Orders Added: 1)  Est. Patient Level IV [30865]

## 2010-03-29 LAB — SYNOVIAL CELL COUNT + DIFF, W/ CRYSTALS
Crystals, Fluid: NONE SEEN
Eosinophils-Synovial: 0 % (ref 0–1)
Lymphocytes-Synovial Fld: 84 % — ABNORMAL HIGH (ref 0–20)
Monocyte-Macrophage-Synovial Fluid: 6 % — ABNORMAL LOW (ref 50–90)
Neutrophil, Synovial: 10 % (ref 0–25)
WBC, Synovial: 580 /mm3 — ABNORMAL HIGH (ref 0–200)

## 2010-03-29 LAB — BODY FLUID CULTURE: Culture: NO GROWTH

## 2010-03-30 LAB — BASIC METABOLIC PANEL
BUN: 8 mg/dL (ref 6–23)
Chloride: 107 mEq/L (ref 96–112)
Creatinine, Ser: 0.88 mg/dL (ref 0.4–1.2)
Glucose, Bld: 169 mg/dL — ABNORMAL HIGH (ref 70–99)

## 2010-03-30 LAB — CBC
MCHC: 33.2 g/dL (ref 30.0–36.0)
MCV: 86.8 fL (ref 78.0–100.0)
Platelets: 260 10*3/uL (ref 150–400)
RDW: 13.9 % (ref 11.5–15.5)
WBC: 8 10*3/uL (ref 4.0–10.5)

## 2010-03-30 LAB — GLUCOSE, CAPILLARY
Glucose-Capillary: 142 mg/dL — ABNORMAL HIGH (ref 70–99)
Glucose-Capillary: 151 mg/dL — ABNORMAL HIGH (ref 70–99)
Glucose-Capillary: 236 mg/dL — ABNORMAL HIGH (ref 70–99)
Glucose-Capillary: 236 mg/dL — ABNORMAL HIGH (ref 70–99)

## 2010-04-10 ENCOUNTER — Inpatient Hospital Stay (HOSPITAL_COMMUNITY)
Admission: EM | Admit: 2010-04-10 | Discharge: 2010-04-14 | DRG: 313 | Disposition: A | Discharge status: Home / Self Care | Payer: Medicare Other | Attending: Internal Medicine | Admitting: Internal Medicine

## 2010-04-10 ENCOUNTER — Emergency Department (HOSPITAL_COMMUNITY): Payer: Medicare Other

## 2010-04-10 ENCOUNTER — Encounter (HOSPITAL_COMMUNITY): Payer: Self-pay

## 2010-04-10 DIAGNOSIS — R0789 Other chest pain: Principal | ICD-10-CM | POA: Diagnosis present

## 2010-04-10 DIAGNOSIS — E039 Hypothyroidism, unspecified: Secondary | ICD-10-CM | POA: Diagnosis present

## 2010-04-10 DIAGNOSIS — E119 Type 2 diabetes mellitus without complications: Secondary | ICD-10-CM | POA: Diagnosis present

## 2010-04-10 DIAGNOSIS — R031 Nonspecific low blood-pressure reading: Secondary | ICD-10-CM | POA: Diagnosis present

## 2010-04-10 DIAGNOSIS — I2510 Atherosclerotic heart disease of native coronary artery without angina pectoris: Secondary | ICD-10-CM | POA: Diagnosis present

## 2010-04-10 DIAGNOSIS — R188 Other ascites: Secondary | ICD-10-CM | POA: Diagnosis present

## 2010-04-10 DIAGNOSIS — Z7982 Long term (current) use of aspirin: Secondary | ICD-10-CM

## 2010-04-10 DIAGNOSIS — I10 Essential (primary) hypertension: Secondary | ICD-10-CM | POA: Diagnosis present

## 2010-04-10 DIAGNOSIS — K7689 Other specified diseases of liver: Secondary | ICD-10-CM | POA: Diagnosis present

## 2010-04-10 LAB — BASIC METABOLIC PANEL
CO2: 22 mEq/L (ref 19–32)
Chloride: 107 mEq/L (ref 96–112)
GFR calc Af Amer: >60 mL/min (ref >60)
Potassium: 3.8 mEq/L (ref 3.5–5.1)
Sodium: 138 mEq/L (ref 135–145)

## 2010-04-10 LAB — CK TOTAL AND CKMB (NOT AT ARMC)
CK, MB: 3 ng/mL (ref 0.3–4.0)
CK, MB: 2.5 ng/mL (ref 0.3–4.0)
CK, MB: 2.7 ng/mL (ref 0.3–4.0)
Relative Index: 1.1 (ref 0.0–2.5)
Relative Index: 1.1 (ref 0.0–2.5)
Total CK: 274 U/L — ABNORMAL HIGH (ref 7–177)
Total CK: 242 U/L — ABNORMAL HIGH (ref 7–177)
Total CK: 236 U/L — ABNORMAL HIGH (ref 7–177)

## 2010-04-10 LAB — CBC
HCT: 43.5 % (ref 36.0–46.0)
Hemoglobin: 14.7 g/dL (ref 12.0–15.0)
MCH: 29 pg (ref 26.0–34.0)
MCHC: 33.8 g/dL (ref 30.0–36.0)
MCV: 85.8 fL (ref 78.0–100.0)
Platelets: 289 10*3/uL (ref 150–400)
RBC: 5.07 MIL/uL (ref 3.87–5.11)
RDW: 13.6 % (ref 11.5–15.5)
WBC: 8.5 10*3/uL (ref 4.0–10.5)

## 2010-04-10 LAB — DIFFERENTIAL
Basophils Relative: 1 % (ref 0–1)
Lymphocytes Relative: 33 % (ref 12–46)
Lymphs Abs: 2.8 10*3/uL (ref 0.7–4.0)
Monocytes Absolute: 0.7 10*3/uL (ref 0.1–1.0)
Monocytes Relative: 8 % (ref 3–12)
Neutro Abs: 4.7 10*3/uL (ref 1.7–7.7)

## 2010-04-10 LAB — HEPARIN LEVEL (UNFRACTIONATED): Heparin Unfractionated: 0.18 IU/mL — ABNORMAL LOW (ref 0.30–0.70)

## 2010-04-10 LAB — APTT: aPTT: 28 seconds (ref 24–37)

## 2010-04-10 LAB — TROPONIN I
Troponin I: 0.02 ng/mL (ref 0.00–0.06)
Troponin I: 0.03 ng/mL (ref 0.00–0.06)

## 2010-04-10 LAB — TSH: TSH: 1.743 u[IU]/mL (ref 0.350–4.500)

## 2010-04-10 LAB — GLUCOSE, CAPILLARY: Glucose-Capillary: 132 mg/dL — ABNORMAL HIGH (ref 70–99)

## 2010-04-10 LAB — D-DIMER, QUANTITATIVE: D-Dimer, Quant: 1.24 ug/mL-FEU — ABNORMAL HIGH (ref 0.00–0.48)

## 2010-04-10 MED ORDER — IOHEXOL 300 MG/ML  SOLN: 100.0000 mL | Freq: Once | INTRAMUSCULAR | Status: AC | PRN

## 2010-04-11 DIAGNOSIS — R072 Precordial pain: Secondary | ICD-10-CM

## 2010-04-11 LAB — LIPID PANEL
Cholesterol: 132 mg/dL (ref 0–200)
HDL: 32 mg/dL — ABNORMAL LOW (ref >39)

## 2010-04-11 LAB — BASIC METABOLIC PANEL
Chloride: 110 mEq/L (ref 96–112)
Creatinine, Ser: 0.82 mg/dL (ref 0.4–1.2)
GFR calc Af Amer: >60 mL/min (ref >60)

## 2010-04-11 LAB — GLUCOSE, CAPILLARY: Glucose-Capillary: 151 mg/dL — ABNORMAL HIGH (ref 70–99)

## 2010-04-11 LAB — CK TOTAL AND CKMB (NOT AT ARMC): CK, MB: 2.4 ng/mL (ref 0.3–4.0)

## 2010-04-11 LAB — LIPASE, BLOOD: Lipase: 33 U/L (ref 11–59)

## 2010-04-11 LAB — CBC
HCT: 38.8 % (ref 36.0–46.0)
Hemoglobin: 12.8 g/dL (ref 12.0–15.0)
MCH: 28.5 pg (ref 26.0–34.0)
MCV: 86.4 fL (ref 78.0–100.0)
Platelets: 248 10*3/uL (ref 150–400)
RBC: 4.49 MIL/uL (ref 3.87–5.11)

## 2010-04-11 LAB — HEPARIN LEVEL (UNFRACTIONATED): Heparin Unfractionated: 0.29 IU/mL — ABNORMAL LOW (ref 0.30–0.70)

## 2010-04-12 LAB — GLUCOSE, CAPILLARY
Glucose-Capillary: 149 mg/dL — ABNORMAL HIGH (ref 70–99)
Glucose-Capillary: 126 mg/dL — ABNORMAL HIGH (ref 70–99)
Glucose-Capillary: 144 mg/dL — ABNORMAL HIGH (ref 70–99)
Glucose-Capillary: 154 mg/dL — ABNORMAL HIGH (ref 70–99)

## 2010-04-12 LAB — BASIC METABOLIC PANEL
Calcium: 8.6 mg/dL (ref 8.4–10.5)
GFR calc non Af Amer: >60 mL/min (ref >60)
Potassium: 4 mEq/L (ref 3.5–5.1)
Sodium: 139 mEq/L (ref 135–145)

## 2010-04-12 LAB — HEPARIN LEVEL (UNFRACTIONATED): Heparin Unfractionated: 0.33 IU/mL (ref 0.30–0.70)

## 2010-04-12 LAB — CBC
Platelets: 259 10*3/uL (ref 150–400)
RBC: 4.95 MIL/uL (ref 3.87–5.11)
WBC: 6.7 10*3/uL (ref 4.0–10.5)

## 2010-04-12 NOTE — H&P (Signed)
[name], [name]                ACCOUNT NO.:  [id number]  MEDICAL RECORD NO.:  [id number]           PATIENT TYPE:  I  LOCATION:  2012                         FACILITY:  [facility]  PHYSICIAN:  Jake Bathe, MD      DATE OF BIRTH:  1945-09-29  DATE OF ADMISSION:  04/10/2010 DATE OF DISCHARGE:                             HISTORY & PHYSICAL   CARDIOLOGIST:  Arturo Morton. Riley Kill, MD, [facility]  PRIMARY CARE PHYSICIAN:  Corwin Levins, MD, Internal Medicine.  CHIEF COMPLAINT:  Chest pain.  HISTORY OF PRESENT ILLNESS:  A 65 year old African American female with coronary artery disease status post most recent catheterization on December 08, 2009, with progression of CAD just beyond circumflex stent of 40% and mild diffuse irregularities in the right coronary artery with widely patent proximal LAD stent, 50% plaque in the first diagonal branch, who presents today with chest discomfort.  Yesterday in the evening time, she developed quite severe she states chest discomfort which radiated down her left arm, substernal in origin.  No diaphoresis. No shortness of breath associated with this.  She took an aspirin because she was trying to avoid coming to the hospital and went to sleep.  Then, she was awoken in the morning with chest pain once again which she characterizes a 10/10 at approximately 6:40 a.m.  She came into the emergent emergency department for further evaluation.  When asked about her current pain now she says that she is having mild chest discomfort but it is eased off quite a bit.  Prior to this date, she was having chest discomfort off and on at sporadic times, does not need to be exertional.  No recent nausea or vomiting.  She states that while in the emergency division earlier this morning, her blood pressures decreased (may have been from nitroglycerin), and she was administered IV fluid.  This was also after administering IV morphine.  MEDICATIONS: 1. Atenolol 25 mg twice a  day. 2. Aspirin 81 mg a day. 3. Metformin 5 mg 2 tablets in the morning and 1 tablet at night. 4. Synthroid 150 mcg every morning. 5. Nitroglycerin as needed.  No known drug allergies.  PAST MEDICAL HISTORY: 1. Diabetes. 2. Hypertension. 3. Hyperlipidemia. 4. Tobacco use, ongoing. 5. Coronary artery disease as described above with obtuse marginal     stent, proximal LAD stent. 6. Obesity. 7. Hypothyroidism. 8. Anxiety and depression. 9. Lumbar sacral disk disease. 10.Family history of coronary artery disease. 11.History of hysterectomy and oophorectomy. 12.History of lumbar fusion. 13.History of parathyroidectomy and thyroidectomy.  FAMILY HISTORY:  Currently noncontributory.  SOCIAL HISTORY:  She does continue to smoke.  No significant alcohol use.  By prior history, works at [employer]  REVIEW OF SYSTEMS:  No bleeding.  No orthopnea.  No PND.  No syncope. No melena.  No rashes.  Unless specified above, all other 12 review of systems negative.  PHYSICAL EXAMINATION:  VITAL SIGNS:  Blood pressure currently 117/64, pulse 77, respirations 16, temperature 98.3. GENERAL:  Overweight female in no acute distress, lying comfortably in bed with her friends at bedside. EYES:  Well-perfused conjunctivae.  EOMI.  No scleral icterus. NECK:  Supple.  No lymphadenopathy.  No thyromegaly.  No carotid bruits. No JVD.  Prior parathyroidectomy scar noted.  Thick neck. CARDIAC:  Regular rate and rhythm without any appreciable murmurs, rubs, or gallops.  Normal PMI. LUNGS:  Clear to auscultation bilaterally.  Normal respiratory effort. ABDOMEN:  Soft, nontender.  Normoactive bowel sounds and obese.  No bruits. EXTREMITIES:  No clubbing, cyanosis, or edema.  Palpable distal pulses. GU:  Deferred. RECTAL:  Deferred. NEUROLOGIC:  Nonfocal.  Gait not assessed.  EKG demonstrates sinus rhythm with no ST-segment changes.  No leftward axis.  No Q-waves.  CT angiogram of chest was  demonstrating no pulmonary embolism but did show a nonspecific mediastinal and hilar lymph node and a left hepatic lesion at warrant further characterization with MRI of the liver.  It was 2.2 x 2.9 cm.  Cardiac marker serum normal so far.  D- dimer was positive prompting CT scan.  INR is normal.  White count 8.5, hemoglobin 14.7, hematocrit 43.5, platelets 289,000, creatinine is 0.81, potassium 3.8.  ASSESSMENT AND PLAN:  A 65 year old female with possible unstable angina, possible acute coronary syndrome with chest pain with somewhat atypical features with known coronary artery disease status post stenting in the past as described above.  See prior catheterization note for full details with obesity, hypertension, diabetes, hyperlipidemia, and newly discovered liver lesion that needs further quantitation with MRI. 1. Possible unstable angina/acute coronary syndrome/chest pain - we     will admit to telemetry, continue to cycle cardiac markers, go     ahead and place on IV heparin.  If cardiac markers are all     negative, we will be able to discontinue heparin at that time.     Given her prior coronary artery disease and prior stenting, we will     go ahead and anticoagulate.  Continue with aspirin, atenolol 25 mg     twice a day.  We will hold metformin and Synthroid 150 mcg every     morning.  If cardiac biomarkers are negative and she is chest pain     free given her recent cardiac catheterization, she may not warrant     any further cardiac workup.  One may consider a nuclear stress test     for further risk stratification in this situation.  If she does     have ongoing worrisome chest discomfort, one may consider repeat     cardiac catheterization, but at this point I would suggest medical     management. 2. Liver lesion - abdominal MRI was suggested.  We will go ahead and     order this if all of her cardiac markers are normal and she is safe     from a cardiac perspective. 3.  Diabetes - hold metformin in case of catheterization, insulin     sliding scale.  Check hemoglobin A1c. 4. Hyperlipidemia - she is currently not on statin therapy - we will     check fasting lipid profile.  Deserves statin.  I     will place on simvastatin 20 mg. 5. Tobacco use - counseled on tobacco cessation at length. 6. Transient hypotension - likely secondary to morphine utilization.     Responded well to IV fluids.     Jake Bathe, MD     MCS/MEDQ  D:  04/10/2010  T:  04/11/2010  Job:  [id number]  cc:   Corwin Levins, MD Maisie Fus  Cheri Kearns, MD, [facility]  Electronically Signed by Donato Schultz MD on 04/12/2010 02:37:27 PM

## 2010-04-13 ENCOUNTER — Ambulatory Visit: Payer: Self-pay | Admitting: Cardiology

## 2010-04-13 ENCOUNTER — Encounter: Payer: Self-pay | Admitting: Cardiology

## 2010-04-13 ENCOUNTER — Inpatient Hospital Stay (HOSPITAL_COMMUNITY): Payer: Medicare Other

## 2010-04-13 LAB — CBC
HCT: 39 % (ref 36.0–46.0)
MCH: 28.4 pg (ref 26.0–34.0)
MCHC: 32.8 g/dL (ref 30.0–36.0)
RDW: 13.7 % (ref 11.5–15.5)

## 2010-04-13 LAB — GLUCOSE, CAPILLARY
Glucose-Capillary: 152 mg/dL — ABNORMAL HIGH (ref 70–99)
Glucose-Capillary: 136 mg/dL — ABNORMAL HIGH (ref 70–99)
Glucose-Capillary: 171 mg/dL — ABNORMAL HIGH (ref 70–99)
Glucose-Capillary: 217 mg/dL — ABNORMAL HIGH (ref 70–99)

## 2010-04-13 MED ORDER — GADOBENATE DIMEGLUMINE 529 MG/ML IV SOLN
20.0000 mL | Freq: Once | INTRAVENOUS | Status: AC
  Administered 2010-04-13: 20 mL via INTRAVENOUS

## 2010-04-14 LAB — CBC
Hemoglobin: 13.7 g/dL (ref 12.0–15.0)
MCH: 28.1 pg (ref 26.0–34.0)
MCHC: 32.5 g/dL (ref 30.0–36.0)
Platelets: 289 10*3/uL (ref 150–400)
RDW: 13.8 % (ref 11.5–15.5)

## 2010-04-14 LAB — GLUCOSE, CAPILLARY
Glucose-Capillary: 104 mg/dL — ABNORMAL HIGH (ref 70–99)
Glucose-Capillary: 208 mg/dL — ABNORMAL HIGH (ref 70–99)

## 2010-04-18 NOTE — H&P (Signed)
[name], [name]                ACCOUNT NO.:  [id number]  MEDICAL RECORD NO.:  [id number]           PATIENT TYPE:  E  LOCATION:  [facility]                         FACILITY:  [facility]  PHYSICIAN:  Baltazar Najjar, MD     DATE OF BIRTH:  1945/11/01  DATE OF ADMISSION:  04/10/2010 DATE OF DISCHARGE:                             HISTORY & PHYSICAL   PRIMARY CARE PHYSICIAN:  Corwin Levins, MD.  CARDIOLOGIST:  Arturo Morton. Riley Kill, MD, [facility] with [facility].  CODE STATUS:  The patient is full code.  CHIEF COMPLAINT:  Chest pain.  HISTORY OF PRESENT ILLNESS:  [name] is a 65 year old African- American woman with history of coronary artery disease status post two stents for LAD and circumflex.  She had cardiac cath back on November 2011 that showed patent stents.  The patient came today with a chief complaint of chest pain for couple of days, intermittent, described it as sharp pain up to 10/10, its worse, associated with diaphoresis, but no nausea.  Radiating to her left arm and shoulder.  Denies any current shortness of breath, however, stated that she did have an episode of shortness of breath when she came in this morning.  Her chest pain lasts for few minutes with each episodes; however, she stated that stayed longer yesterday.  The patient was seen by Dr. Preston Fleeting in the ED.  She was found to have an elevated D-dimer.  Subsequently, she had a CTA of the lungs done that showed no evidence of PE, however, incidentally showed liver lesion.  Her troponins are negative.  EKG unremarkable.  PAST MEDICAL HISTORY:  Significant for, 1. Coronary artery disease status post stent to LAD and circumflex     status post cardiac catheterization in November 2011 that showed     patent stent. 2. Hyperlipidemia. 3. Diabetes mellitus type 2. 4. History of tobacco abuse. 5. Hypothyroidism. 6. Obesity. 7. Anxiety and depression. 8. History of lumbosacral disk disease.  PAST SURGICAL  HISTORY:  Hysterectomy and oophorectomy, history of back surgery, history of parathyroidectomy and thyroidectomy.  SOCIAL HISTORY:  Lives with her daughter.  Denies any smoking.  Stated that she used to smoke in the past, currently quit.  Denies drinking or illicit drug use.  ALLERGIES:  No known drug allergies.  HOME MEDICATIONS: 1. Artificial tears both eyes 1 drop daily as needed. 2. Nitroglycerin 0.4 mg sublingually every 5 minutes as needed up to 3     doses. 3. Synthroid 150 mcg daily. 4. Metformin 500 mg 1-2 tablets twice daily. 5. Atenolol 25 mg p.o. b.i.d. 6. Aspirin 81 mg daily.  REVIEW OF SYSTEMS:  As above in HPI.  Other systems reported negative. CHEST:  She had a transient episode of shortness of breath, currently resolved.  Denies any cough.  CARDIOVASCULAR:  Significant for chest pain as above.  ABDOMEN:  No nausea, no vomiting.  No change in her bowel habit.  GU:  No burning micturition.  No flank pain.  No hematuria.  CONSTITUTIONAL:  No fever or chills.  PHYSICAL EXAM:  VITAL SIGNS:  Blood pressure 107/64, she had  a transient episode of hypotension with systolic blood pressure in the 80s, improved with normal saline currently at 115/60, pulse rate of 81, temperature 98.3, O2 sat 97% on oxygen. GENERAL:  She is alert and oriented x3. NECK:  Supple.  No JVD. LUNGS:  Clear to auscultation bilaterally. CARDIOVASCULAR:  S1 and S2, regular rhythm and rate. ABDOMEN:  Obese, soft, and nontender. EXTREMITIES:  Showed no pedal edema.  LABORATORY DATA:  EKG showed sinus rhythm with 87 beats per minutes, left axis deviation.  Laboratory results, troponin-I 0.02, CK 274, CK-MB 3, potassium 3.8, BUN 9, creatinine 0.8, D-dimer 1.24.  CBC showed hemoglobin of 14.7.  RADIOLOGY/IMAGING:  CT angiogram of the chest showed no pulmonary embolism, nonspecific mediastinal hilar lymph nodes as described, left hepatic lesions.  ASSESSMENT: 1. Chest pain. 2. History of  coronary artery disease status post stents. 3. Incidental finding of left hepatic lesion on CTA. 4. Elevated D-dimer with negative CTA for pulmonary embolism. 5. Hypothyroidism.  PLAN: 1. The patient will be admitted to telemetry floor.  We will cycle her     cardiac enzymes.  Dr. Patty Sermons from [facility] was     consulted.  Continue aspirin, atenolol, and nitroglycerin p.r.n. if     blood pressure allows. 2. Incidental findings of liver lesion.  We will need further     evaluation and repeat imaging as recommended. 3. Hypothyroidism.  Continue with Synthroid.  Check TSH. 4. History of hyperlipidemia.  Currently, not on statins.  We will     check her lipid panel. 5. Reported history of hypertension as per chart.  The patient denies     any history of hypertension.  She is on     atenolol at home and she had a transient episode of hypotension in     the ED.  We will continue with atenolol if blood pressure allows     for cardioprotective purposes. 6. Code status.  The patient is a full code.          ______________________________ Baltazar Najjar, MD     SA/MEDQ  D:  04/10/2010  T:  04/10/2010  Job:  [id number]  cc:   Corwin Levins, MD Arturo Morton. Riley Kill, MD, [facility]  Electronically Signed by Hannah Beat MD on 04/18/2010 08:52:22 PM

## 2010-04-18 NOTE — Discharge Summary (Signed)
[name], [name]                ACCOUNT NO.:  [id number]  MEDICAL RECORD NO.:  [id number]           PATIENT TYPE:  I  LOCATION:  2012                         FACILITY:  [facility]  PHYSICIAN:  Baltazar Najjar, MD     DATE OF BIRTH:  1945/09/12  DATE OF ADMISSION:  04/10/2010 DATE OF DISCHARGE:                              DISCHARGE SUMMARY   DATE OF DISCHARGE:  To be determined.  FINAL DISCHARGE DIAGNOSES: 1. Chest pain, most likely musculoskeletal. 2. History of coronary artery disease status post stents to left     anterior descending and circumflex status post cardiac     catheterization in November 2011 that showed patent stents. 3. Incidental finding of liver lesion on CT scan. 4. Diabetes mellitus type 2. 5. Hypothyroidism.  CONSULTATIONS:  The patient was seen by Dr. Donato Schultz from Cardiology Service.  IMAGING:  CT angiogram of the chest done on April 09, 2009, showed no acute pulmonary embolus, nonspecific mediastinal hilar lymph nodes, also showed left hepatic lesions.  Where on further characterization, MRI of the liver with contrast is recommended.  Diverticulosis.  BRIEF ADMITTING HISTORY:  Please refer to the dictated H and P.  On summary, [name] is a 65 year old African American woman with the above history presented to the hospital with chief complaint of chest pain, which she described as sharp pain, radiate to her left shoulder and arm and worsened with movement.  HOSPITAL COURSE: 1. Chest pain.  The patient was admitted to telemetry floor.  Her     troponins were cycled and were unremarkable.  The patient was seen     by Cardiology Service.  She was initially started on heparin drip     for possible unstable angina.  She was also continued on aspirin,     atenolol, statins.  However, as the patient's symptoms continued to     be suggestive of atypical chest pain which is most likely     musculoskeletal in nature, her heparin drip was  discontinued and     the patient was treated for pain with Tylenol p.r.n. and she is     also on Percocet p.r.n. for severe pain. 2. History of coronary artery disease status post stent to LAD and     circumflex.  The patient was continued on aspirin, Plavix, statins     and atenolol and was followed by Cardiology. 3. Incidental findings of liver lesion.  MRI of the liver was ordered.     Still pending at the time of dictation. 4. Hypothyroidism.  The patient was continued on her Synthroid. 5. Diabetes mellitus type 2, fairly controlled.  Her metformin was     held during this hospitalization.  The patient is currently being     covered with insulin sliding scale.  DISCHARGE MEDICATIONS:  To be reconciled at the time of discharge.  DISCHARGE INSTRUCTIONS:  The patient to follow with her cardiologist and PCP as an outpatient and to report any worsening of symptoms or any new symptoms to her PCP or come back to the ED. The patient to continue her medications  as prescribed and counseled on avoidance of tobacco products.  CURRENT CONDITION:  The patient is currently stable.  DISPOSITION:  The patient will be discharged home, pending MRI results.          ______________________________ Baltazar Najjar, MD     SA/MEDQ  D:  04/13/2010  T:  04/13/2010  Job:  [id number]  cc:   Arturo Morton. Riley Kill, MD, [facility] Jake Bathe, MD Oliver Barre  Electronically Signed by Hannah Beat MD on 04/18/2010 08:52:40 PM

## 2010-04-22 ENCOUNTER — Ambulatory Visit (INDEPENDENT_AMBULATORY_CARE_PROVIDER_SITE_OTHER): Payer: Medicare Other | Admitting: Cardiology

## 2010-04-22 ENCOUNTER — Encounter: Payer: Self-pay | Admitting: Cardiology

## 2010-04-22 DIAGNOSIS — R109 Unspecified abdominal pain: Secondary | ICD-10-CM

## 2010-04-22 DIAGNOSIS — I10 Essential (primary) hypertension: Secondary | ICD-10-CM

## 2010-04-22 DIAGNOSIS — E785 Hyperlipidemia, unspecified: Secondary | ICD-10-CM

## 2010-04-22 DIAGNOSIS — I2510 Atherosclerotic heart disease of native coronary artery without angina pectoris: Secondary | ICD-10-CM

## 2010-04-22 DIAGNOSIS — R072 Precordial pain: Secondary | ICD-10-CM

## 2010-04-22 MED ORDER — OMEPRAZOLE 20 MG PO CPDR: 20.0000 mg | DELAYED_RELEASE_CAPSULE | Freq: Every day | ORAL | Status: DC

## 2010-04-22 NOTE — Patient Instructions (Signed)
Your physician has requested that you have a lexiscan myoview. For further information please visit [email or web address] Please follow instruction sheet, as given.  Your physician has recommended that you have a sleep study. This test records several body functions during sleep, including: brain activity, eye movement, oxygen and carbon dioxide blood levels, heart rate and rhythm, breathing rate and rhythm, the flow of air through your mouth and nose, snoring, body muscle movements, and chest and belly movement.  You have been referred to GI for liver cysts and abdominal pain.  Your physician has recommended you make the following change in your medication: START Omeprazole 20mg  once a day  Your physician recommends that you schedule a follow-up appointment in: 1 WEEK with Dr Riley Kill

## 2010-04-25 ENCOUNTER — Emergency Department (HOSPITAL_COMMUNITY)
Admission: EM | Admit: 2010-04-25 | Discharge: 2010-04-26 | Disposition: A | Discharge status: Home / Self Care | Payer: Medicare Other | Source: Home / Self Care | Attending: Emergency Medicine | Admitting: Emergency Medicine

## 2010-04-25 DIAGNOSIS — E039 Hypothyroidism, unspecified: Secondary | ICD-10-CM | POA: Insufficient documentation

## 2010-04-25 DIAGNOSIS — R079 Chest pain, unspecified: Secondary | ICD-10-CM | POA: Insufficient documentation

## 2010-04-25 DIAGNOSIS — E119 Type 2 diabetes mellitus without complications: Secondary | ICD-10-CM | POA: Insufficient documentation

## 2010-04-25 DIAGNOSIS — R0602 Shortness of breath: Secondary | ICD-10-CM | POA: Insufficient documentation

## 2010-04-26 ENCOUNTER — Telehealth: Payer: Self-pay | Admitting: Cardiology

## 2010-04-26 ENCOUNTER — Emergency Department (HOSPITAL_COMMUNITY): Payer: Medicare Other

## 2010-04-26 LAB — CBC
MCH: 28.6 pg (ref 26.0–34.0)
MCHC: 32.8 g/dL (ref 30.0–36.0)
MCV: 87.1 fL (ref 78.0–100.0)
Platelets: 325 10*3/uL (ref 150–400)

## 2010-04-26 LAB — COMPREHENSIVE METABOLIC PANEL
AST: 32 U/L (ref 0–37)
Albumin: 3.7 g/dL (ref 3.5–5.2)
Alkaline Phosphatase: 86 U/L (ref 39–117)
BUN: 8 mg/dL (ref 6–23)
CO2: 24 mEq/L (ref 19–32)
Chloride: 103 mEq/L (ref 96–112)
Creatinine, Ser: 0.8 mg/dL (ref 0.4–1.2)
GFR calc non Af Amer: >60 mL/min (ref >60)
Potassium: 3.5 mEq/L (ref 3.5–5.1)
Total Bilirubin: 0.6 mg/dL (ref 0.3–1.2)

## 2010-04-26 LAB — POCT CARDIAC MARKERS: Troponin i, poc: <0.05 ng/mL (ref 0.00–0.09)

## 2010-04-26 LAB — DIFFERENTIAL
Basophils Relative: 0 % (ref 0–1)
Eosinophils Absolute: 0.2 10*3/uL (ref 0.0–0.7)
Eosinophils Relative: 2 % (ref 0–5)
Lymphs Abs: 3.6 10*3/uL (ref 0.7–4.0)
Monocytes Absolute: 0.9 10*3/uL (ref 0.1–1.0)
Monocytes Relative: 9 % (ref 3–12)

## 2010-04-26 NOTE — Discharge Summary (Signed)
[name], [name]                ACCOUNT NO.:  [id number]  MEDICAL RECORD NO.:  [id number]           PATIENT TYPE:  I  LOCATION:  2012                         FACILITY:  [facility]  PHYSICIAN:  Erick Blinks, MD     DATE OF BIRTH:  [birth year]  DATE OF ADMISSION:  04/10/2010 DATE OF DISCHARGE:  04/14/2010                              DISCHARGE SUMMARY   ADDENDUM:  PRIMARY CARE PHYSICIAN:  Dr. Oliver Barre of [place].  CARDIOLOGIST:  Dr. Shawnie Pons.  DISCHARGE DIAGNOSES: 1. Chest pain, most likely musculoskeletal, negative cardiac workup. 2. History of coronary artery disease, status post stent in the left     anterior descending and circumflex. 3. Indeterminate cystic liver lesions for outpatient followup. 4. Infrarenal abdominal aortic aneurysm measuring 3.9 cm for     outpatient followup. 5. Non-insulin dependent diabetes. 6. Hypothyroidism. 7. Hypertension. 8. Obesity.  DISCHARGE MEDICATIONS: 1. Atenolol 25 mg p.o. b.i.d. 2. Simvastatin 20 mg p.o. nightly. 3. Plavix 75 mg p.o. daily. 4. Oxycodone/APAP 5/325 mg 2 tablets p.o. q.6 h. p.r.n. 5. Aspirin 81 mg p.o. q.a.m. 6. Synthroid 160 mcg 1 tablet p.o. q.a.m. 7. Metformin 500 mg 1-2 tablets p.o. b.i.d. 8. Nitroglycerin sublingual 0.4 mg 1 tablet sublingual q.5 minutes     p.r.n. x3. 9. Artificial tears 1 drop both eyes daily p.r.n.  FOR ADMISSION HISTORY AND HOSPITAL COURSE:  Please refer to the discharge summary dictated by Dr. Cleotis Lema on March 27.  The patient was sent to the hospital for further evaluation of her liver lesion.  She underwent MRI which showed to 2 indeterminate complex cystic lesions in the lateral segment, left hepatic lobe, and at the junction between the right and left lobes, each measuring approximately 3 cm. Differential diagnosis included cystic hepatic neoplasm such as biliary cystadenoma or rarely biliary cystadenocarcinoma.  Management options include imaging followup by MRI in 6  months and percutaneous needle biopsy, incomplete visualization of infrarenal abdominal aortic aneurysm measuring of these 3.9 cm.  Further evaluation by ultrasound of abdominal aorta is recommended, hepatic steatosis and diverticulosis incidentally noted.  DISCHARGE INSTRUCTIONS:  The patient will need to follow up with her primary care physician within the next 7 days, once she is discharged from the hospital.  She will also need to follow up with her current primary cardiologist as previously scheduled.  Regarding her liver lesion, she has elected to have repeat MRI in the next 6 months.  She was offered a percutaneous biopsy at this time but has declined and wished to further discuss with her primary care physician.  She will also need surveillance of her abdominal aortic aneurysm with abdominal ultrasound. She is to continue on heart-healthy low-calorie diet, conduct her activities as tolerated.  She has been cleared from cardiac perspective for her chest pain which was felt to be musculoskeletal, no further inpatient cardiology workup was recommended.  Plan will be to discharge home the patient today.  CONDITION AT THE TIME OF DISCHARGE:  Improved.     Erick Blinks, MD     JM/MEDQ  D:  04/14/2010  T:  04/15/2010  Job:  [id number]  cc:   Corwin Levins, MD Arturo Morton. Riley Kill, MD, [facility]  Electronically Signed by Erick Blinks  on 04/26/2010 02:14:48 PM

## 2010-04-27 NOTE — Telephone Encounter (Signed)
Message on my desktop from 04/26/10.  This was my scheduled day off.  I left a message for the pt to call back.  The pt is scheduled for Myoview on 05/04/10 and this test needs to be rescheduled to an earlier time. Dr Riley Kill had wanted this pt to have Myoview this week but when she scheduled test she had conflicts with her schedule.

## 2010-04-28 ENCOUNTER — Encounter: Payer: Self-pay | Admitting: Gastroenterology

## 2010-04-28 ENCOUNTER — Ambulatory Visit (INDEPENDENT_AMBULATORY_CARE_PROVIDER_SITE_OTHER): Payer: Medicare Other | Admitting: Gastroenterology

## 2010-04-28 VITALS — BP 102/68 | HR 76 | Ht 66.0 in | Wt 229.0 lb

## 2010-04-28 DIAGNOSIS — R932 Abnormal findings on diagnostic imaging of liver and biliary tract: Secondary | ICD-10-CM

## 2010-04-28 DIAGNOSIS — R933 Abnormal findings on diagnostic imaging of other parts of digestive tract: Secondary | ICD-10-CM | POA: Insufficient documentation

## 2010-04-28 DIAGNOSIS — R0789 Other chest pain: Secondary | ICD-10-CM

## 2010-04-28 DIAGNOSIS — R079 Chest pain, unspecified: Secondary | ICD-10-CM

## 2010-04-28 NOTE — Assessment & Plan Note (Addendum)
Etiology of her pain is uncertain. There are certainly elements that suggest cardiac ischemia. Chest wall pain or pain from the esophageal spasm and reflux are other possibilities.  Recommendations #1 begin Nexium 40 mg daily. #2 upper endoscopy if, after tomorrow's evaluation by cardiology, it is still felt that her pain is not cardiac in origin.

## 2010-04-28 NOTE — Telephone Encounter (Signed)
I spoke with the pt and made her aware that she needs to have her myoview rescheduled to this week.  Pt is seeing Dr Arlyce Dice today and can do the test tomorrow. The pt's myoview has been rescheduled to tomorrow at 12:00 and pt aware.

## 2010-04-28 NOTE — Assessment & Plan Note (Addendum)
There are 2 septated hepatic cyst. Differential diagnosis includes biliary cystadenoma or  biliary cyst adenocarcinoma.  Medications #1 I will review the scans with radiology with consideration of percutaneous biopsy

## 2010-04-28 NOTE — Progress Notes (Signed)
History of Present Illness:  [name] is a pleasant, 65 year old, Afro-American female with diabetes and coronary artery disease referred at the request of Dr. Jonny Ruiz for evaluation of chest pain. Over the last few weeks she's been complaining of resting, sharp pressing, midsternal chest pain with radiation to her left arm causing numbness. This occurs with lifting and with mild exertion. It is relieved with nitroglycerin. She was hospitalized and underwent cardiac workup where enzymes were negative as well as EKG changes. It was felt that pain may be due to chest wall pain, or perhaps esophageal pain.  A stable abdominal aortic aneurysm was noted. One year ago CT scan to rule out a pulmonary embolus demonstrated 2 indeterminate liver cysts.  Followup MRI for this during her last adminssion,  which I reviewed, again demonstrated 2 cystic lesions in the liver. Septations were noted.[name] LFTs are normal. She denies pyrosis or dysphagia. She is on no gastric irritants including nonsteroidals. She claims that Prilosec causes her to have chest pain.    Review of Systems: Pertinent positive and negative review of systems were noted in the above HPI section. All other review of systems were otherwise negative.    Current Medications, Allergies, Past Medical History, Past Surgical History, Family History and Social History were reviewed in [employer] electronic medical record  Vital signs were reviewed in today's medical record. Physical Exam: General: Well developed , well nourished, no acute distress Head: Normocephalic and atraumatic Eyes:  sclerae anicteric, EOMI Ears: Normal auditory acuity Mouth: No deformity or lesions Lungs: Clear throughout to auscultation Heart: Regular rate and rhythm; no murmurs, rubs or bruits Abdomen: Soft, non tender and non distended. No masses, hepatosplenomegaly or hernias noted. Normal Bowel sounds Rectal:deferred Musculoskeletal: Symmetrical with no gross  deformities  Pulses:  Normal pulses noted Extremities: No clubbing, cyanosis, edema or deformities noted Neurological: Alert oriented x 4, grossly nonfocal Psychological:  Alert and cooperative. Normal mood and affect

## 2010-04-28 NOTE — Patient Instructions (Signed)
Your EGD is scheduled on 05/04/2010 at 3:30pm  We are giving you Nexium samples today  Upper GI Endoscopy Upper GI endoscopy means using a flexible scope to look at the esophagus, stomach and upper small bowel. This is done to make a diagnosis in people with heartburn, abdominal pain, or abnormal bleeding. Sometimes an endoscope is needed to remove foreign bodies or food that become stuck in the esophagus; it can also be used to take biopsy samples. For the best results, do not eat or drink for 8 hours before having your upper endoscopy.  To perform the endoscopy, you will probably be sedated and your throat will be numbed with a special spray. The endoscope is then slowly passed down your throat (this will not interfere with your breathing). An endoscopy exam takes 15-30 minutes to complete and there is no real pain. Patients rarely remember much about the procedure. The results of the test may take several days if a biopsy or other test is taken.  You may have a sore throat after an endoscopy exam. Serious complications are very rare. Stick to liquids and soft foods until your pain is better. You should not drive a car or operate any dangerous equipment for at least 24 hours after being sedated. SEEK IMMEDIATE MEDICAL CARE IF:  You have severe throat pain.   You have shortness of breath.   You have bleeding problems.   You have a fever.   You have difficulty recovering from your sedation.  Document Released: 02/11/2004 Document Re-Released: 03/30/2009 [facility] Patient Information 2011 [place], [place].

## 2010-04-29 ENCOUNTER — Inpatient Hospital Stay (HOSPITAL_COMMUNITY)
Admission: AD | Admit: 2010-04-29 | Discharge: 2010-05-02 | DRG: 249 | Disposition: A | Discharge status: Home / Self Care | Payer: Medicare Other | Source: Ambulatory Visit | Attending: Cardiovascular Disease | Admitting: Cardiovascular Disease

## 2010-04-29 ENCOUNTER — Ambulatory Visit (INDEPENDENT_AMBULATORY_CARE_PROVIDER_SITE_OTHER): Payer: Medicare Other | Admitting: Cardiovascular Disease

## 2010-04-29 ENCOUNTER — Ambulatory Visit (HOSPITAL_BASED_OUTPATIENT_CLINIC_OR_DEPARTMENT_OTHER): Payer: Medicare Other | Admitting: Radiology

## 2010-04-29 ENCOUNTER — Encounter: Payer: Self-pay | Admitting: Cardiovascular Disease

## 2010-04-29 DIAGNOSIS — Z7902 Long term (current) use of antithrombotics/antiplatelets: Secondary | ICD-10-CM

## 2010-04-29 DIAGNOSIS — Z9861 Coronary angioplasty status: Secondary | ICD-10-CM

## 2010-04-29 DIAGNOSIS — Z7982 Long term (current) use of aspirin: Secondary | ICD-10-CM

## 2010-04-29 DIAGNOSIS — E669 Obesity, unspecified: Secondary | ICD-10-CM | POA: Diagnosis present

## 2010-04-29 DIAGNOSIS — F17200 Nicotine dependence, unspecified, uncomplicated: Secondary | ICD-10-CM | POA: Diagnosis present

## 2010-04-29 DIAGNOSIS — I214 Non-ST elevation (NSTEMI) myocardial infarction: Principal | ICD-10-CM | POA: Diagnosis present

## 2010-04-29 DIAGNOSIS — Z8249 Family history of ischemic heart disease and other diseases of the circulatory system: Secondary | ICD-10-CM

## 2010-04-29 DIAGNOSIS — I2510 Atherosclerotic heart disease of native coronary artery without angina pectoris: Secondary | ICD-10-CM

## 2010-04-29 DIAGNOSIS — I10 Essential (primary) hypertension: Secondary | ICD-10-CM

## 2010-04-29 DIAGNOSIS — R072 Precordial pain: Secondary | ICD-10-CM

## 2010-04-29 DIAGNOSIS — E119 Type 2 diabetes mellitus without complications: Secondary | ICD-10-CM

## 2010-04-29 DIAGNOSIS — R0789 Other chest pain: Secondary | ICD-10-CM

## 2010-04-29 DIAGNOSIS — I2582 Chronic total occlusion of coronary artery: Secondary | ICD-10-CM | POA: Diagnosis present

## 2010-04-29 DIAGNOSIS — E039 Hypothyroidism, unspecified: Secondary | ICD-10-CM | POA: Diagnosis present

## 2010-04-29 DIAGNOSIS — E785 Hyperlipidemia, unspecified: Secondary | ICD-10-CM | POA: Diagnosis present

## 2010-04-29 DIAGNOSIS — F341 Dysthymic disorder: Secondary | ICD-10-CM | POA: Diagnosis present

## 2010-04-29 DIAGNOSIS — R9430 Abnormal result of cardiovascular function study, unspecified: Secondary | ICD-10-CM

## 2010-04-29 DIAGNOSIS — Z79899 Other long term (current) drug therapy: Secondary | ICD-10-CM

## 2010-04-29 LAB — GLUCOSE, CAPILLARY

## 2010-04-29 MED ORDER — TECHNETIUM TC 99M TETROFOSMIN IV KIT
11.0000 | PACK | Freq: Once | INTRAVENOUS | Status: AC | PRN
  Administered 2010-04-29: 11 via INTRAVENOUS

## 2010-04-29 MED ORDER — TECHNETIUM TC 99M TETROFOSMIN IV KIT
33.0000 | PACK | Freq: Once | INTRAVENOUS | Status: AC | PRN
  Administered 2010-04-29: 33 via INTRAVENOUS

## 2010-04-29 MED ORDER — REGADENOSON 0.4 MG/5ML IV SOLN
0.4000 mg | Freq: Once | INTRAVENOUS | Status: AC
  Administered 2010-04-29: 0.4 mg via INTRAVENOUS

## 2010-04-29 NOTE — Progress Notes (Signed)
[facility] SITE 3 NUCLEAR MED [address] [place] [address] [phone]  Cardiology Nuclear Med Study  [name] is a 65 y.o. female [id number] December 01, 1945   Nuclear Med Background Indication for Stress Test:  Evaluation for Ischemia and 04/15/10 Post Hospital: CP, (-) enzymes History:  '98 IWMI, '03 Stent-CFX, '06 PTCA/Stent-LAD, '07 OZH:YQMVHQIO ischemia, '11 Cath:patent stents, EF=55%, h/o AAA (3.9 cm) Cardiac Risk Factors: Family History - CAD, History of Smoking, Hypertension, Lipids, NIDDM, Obesity and PVD  Symptoms:  Chest Tightness with and without Exertion (last episode of chest discomfort was last night) and Diaphoresis   Nuclear Pre-Procedure Caffeine/Decaff Intake:  None NPO After: 10:00pm   Lungs:  Clear.  O2 sat 99% on RA. IV 0.9% NS with Angio Cath:  20g  IV Site: R Forearm  IV Started by:  Stanton Kidney, EMT-P  Chest Size (in):  44 Cup Size: C  Height: 5\' 6"  (1.676 m)  Weight:  229 lb (103.874 kg)  BMI:  Body mass index is 36.96 kg/(m^2). Tech Comments:  Atenolol held this am, per patient.    Nuclear Med Study 1 or 2 day study: 1 day  Stress Test Type:  Eugenie Birks  Reading MD: Marca Ancona, MD  Order Authorizing Provider:  Shawnie Pons, MD  Resting Radionuclide: Technetium 71m Tetrofosmin  Resting Radionuclide Dose: 11.0 mCi   Stress Radionuclide:  Technetium 93m Tetrofosmin  Stress Radionuclide Dose: 33.0 mCi           Stress Protocol Rest HR: 76 Stress HR: 96  Rest BP: 118/79 Stress BP: 117/78  Exercise Time (min): n/a METS: n/a          Dose of Adenosine (mg):  n/a Dose of Lexiscan: 0.4 mg  Dose of Atropine (mg): n/a Dose of Dobutamine: n/a mcg/kg/min (at max HR)  Stress Test Technologist: [employer], CMA-N  Nuclear Technologist:  Doyne Keel, CNMT     Rest Procedure:  Myocardial perfusion imaging was performed at rest 45 minutes following the intravenous administration of Technetium 49m Tetrofosmin.  Rest ECG:  Nonspecific T-wave changes.  Stress Procedure:  The patient received IV Lexiscan 0.4 mg over 15-seconds.  Technetium 52m Tetrofosmin injected at 30-seconds.  There were no significant changes with Lexiscan.  She did c/o chest pressure.  Quantitative spect images were obtained after a 45 minute delay.  Patient was in the process of leaving and began having chest tightness, 10/10.  She was brought back and an EKG was repeated and the patient was given NTG x 1 with relief.  Dr. Excell Seltzer, DOD, saw patient and reviewed her images and is admitting her to [facility].  Stress ECG: No significant change from baseline ECG  QPS Raw Data Images:  Normal; no motion artifact; normal heart/lung ratio. Stress Images:  Small mid anterior perfusion defect.  Moderate basal to mid inferolateral perfusion defect.  Rest Images:  Small mid anterior perfusion defect.   Subtraction (SDS):  Small, primarily fixed mid anterior perfusion defect.  Moderate reversible basal to mid inferolateral perfusion defect.   Transient Ischemic Dilatation (Normal <1.22):  1.04 Lung/Heart Ratio (Normal <0.45):  0.37  Quantitative Gated Spect Images QGS EDV:  110 ml QGS ESV:  50 ml QGS cine images:  basal lateral hypokinesis.  QGS EF: 54%  Impression Exercise Capacity:  Lexiscan with no exercise. BP Response:  Normal blood pressure response. Clinical Symptoms:  Chest pressure with infusion. ECG Impression:  No significant ST segment change suggestive of ischemia. Comparison with  Prior Nuclear Study: New inferolateral defect.   Overall Impression:  Moderate reversible basal to mid inferolateral perfusion defect suggestive of ischemia.  Primarily fixed, small mid anterior perfusion defect could represent infarction.  [place] [employer]

## 2010-04-29 NOTE — Assessment & Plan Note (Signed)
Blood pressure is controlled. Continue current medical program.

## 2010-04-29 NOTE — Assessment & Plan Note (Signed)
Metformin will be placed on hold. The patient will be placed on sliding scale insulin.

## 2010-04-29 NOTE — Progress Notes (Signed)
HPI:  This is a 65 year old woman with known coronary artery disease presenting for evaluation after an abnormal Myoview stress scan. The patient has a history of PCI of both the LAD and left circumflex. Her most recent cardiac catheterization was in November 2011. This study demonstrated patency of the stents in her LAD and left circumflex, but the left circumflex stent had a centric plaque just beyond the distal edge. This was felt to not be high-grade stenosis. The right coronary artery had only mild luminal irregularities. The patient's left ventricular function has been normal, most recently estimated at 55% by ventriculography.  The patient was recently seen by Dr. Riley Kill for increasing chest pain. A Myoview scan was arranged and this was performed this afternoon. This demonstrated ischemia of the anterolateral wall with the most significant defect involving predominately the lateral wall. The study has not been officially interpreted but that is my impression. The patient had no chest pain during the stress test, but following the procedure she developed substernal chest tightness. This resolved within a few minutes of receiving one sublingual mitral glycerin. She is currently chest pain-free. She describes daily chest pain and increasing use of sublingual nitroglycerin. She describes her pain as a pressure-like sensation over the center of the chest associated with shortness of breath. She denies lightheadedness, syncope, orthopnea, PND, palpitations, or edema.  Outpatient Encounter Prescriptions as of 04/29/2010  Medication Sig Dispense Refill  . aspirin 81 MG EC tablet Take 81 mg by mouth daily.        [name] atenolol (TENORMIN) 25 MG tablet Take 25 mg by mouth 2 (two) times daily.        . Diazepam (VALIUM PO) Take 1 tablet by mouth as needed.        . fexofenadine (ALLEGRA) 180 MG tablet Take 180 mg by mouth daily as needed.        . fluconazole (DIFLUCAN) 150 MG tablet Take 150 mg by mouth as  directed. TAKE 1 TAB EVERY THIRD DAY PRN       . HYDROcodone-acetaminophen (NORCO) 10-325 MG per tablet Take 1 tablet by mouth at bedtime as needed. TAKE 1 TAB QHS PRN UNTIL 02/25/10       . levothyroxine (SYNTHROID, LEVOTHROID) 150 MCG tablet Take 150 mcg by mouth daily.        . metFORMIN (GLUCOPHAGE-XR) 500 MG 24 hr tablet 500 mg. 2 tablets AM and 1 tablet PM      . nitroGLYCERIN (NITROSTAT) 0.4 MG SL tablet Place 0.4 mg under the tongue every 5 (five) minutes as needed.        [name] omeprazole (PRILOSEC) 20 MG capsule Take 1 capsule (20 mg total) by mouth daily.  30 capsule  11  . rosuvastatin (CRESTOR) 20 MG tablet Take 20 mg by mouth daily.         Facility-Administered Encounter Medications as of 04/29/2010  Medication Dose Route Frequency Provider Last Rate Last Dose  . regadenoson (LEXISCAN) injection SOLN 0.4 mg  0.4 mg Intravenous Once Shawnie Pons, MD   0.4 mg at 04/29/10 1229  . technetium tetrofosmin (TC-MYOVIEW) injection 11 milli Curie  11 milli Curie Intravenous Once PRN Marca Ancona, MD   11 milli Curie at 04/29/10 1230  . technetium tetrofosmin (TC-MYOVIEW) injection 33 milli Curie  33 milli Curie Intravenous Once PRN Marca Ancona, MD   33 milli Curie at 04/29/10 1355    Doxycycline and Miconazole nitrate  Past Medical History  Diagnosis Date  . Coronary artery  disease   . Hyperlipidemia   . Hypertension   . Obesity, unspecified     MODERATE  . Thyroid disease     HYPOTHYROIDISM  . Diabetes mellitus     TYPE II  . Anxiety state, unspecified   . Depressive disorder, not elsewhere classified   . Unspecified menopausal and postmenopausal disorder   . Simple or unspecified chronic serous otitis media   . Degeneration of lumbar or lumbosacral intervertebral disc   . Allergic rhinitis, cause unspecified   . Hx of hysterectomy     Past Surgical History  Procedure Date  . Total abdominal hysterectomy   . Lumbar fusion 01/2007    DR. Noel Gerold...3-LEVEL WITH FIXATION  .  Parathyroidectomy   . Thyroidectomy   . Cardiac catheterization     PCI OF BOTH THE CIRCUMFLEX AND LEFT ANTERIOR DESCENDING ARTERY  . Cesarean section     History   Social History  . Marital Status: Divorced    Spouse Name: N/A    Number of Children: 3  . Years of Education: N/A   Occupational History  . 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     Social History Main Topics  . Smoking status: Former Smoker    Types: Cigarettes  . Smokeless tobacco: Never Used  . Alcohol Use: No  . Drug Use: No  . Sexually Active: Not on file   Other Topics Concern  . Not on file   Social History Narrative   DIVORCED3 CHILDRENPATIENT SIGNED A DESIGNATED PARTY RELEASE TO ALLOW HER DAUGHTER, TRAMAINE Bisesi, TO HAVE ACCESS TO HER MEDICAL RECORDS/INFORMATION. Daphane Shepherd, May 04, 2009 @ 3:27 PM    Family History  Problem Relation Age of Onset  . Heart attack Mother 62    s/p D&C-CARDIAC ARREST 1966  . Heart attack Father 62    1978 WITH MI  . Colon cancer Neg Hx     ROS: General: no fevers/chills/night sweats Eyes: no blurry vision, diplopia, or amaurosis ENT: no sore throat or hearing loss Resp: no cough, wheezing, or hemoptysis CV: no edema or palpitations GI: no abdominal pain, nausea, vomiting, diarrhea, or constipation GU: no dysuria, frequency, or hematuria Skin: no rash Neuro: no headache, numbness, tingling, or weakness of extremities Musculoskeletal: no joint pain or swelling Heme: no bleeding, DVT, or easy bruising Endo: no polydipsia or polyuria    PHYSICAL EXAM: Heart rate 80 beats per minute, blood pressure 112/70, oxygen saturation 98% on room air. Respiratory rate 16. Pt is alert and oriented, WD, WN, in no distress. HEENT: normal Neck: JVP normal. Carotid upstrokes normal without bruits. No thyromegaly. Lungs: equal expansion, clear bilaterally CV: Apex is discrete and nondisplaced, RRR without murmur or gallop Abd: soft, NT, +BS, no bruit, no  hepatosplenomegaly Back: no CVA tenderness Ext: no C/C/E        Femoral pulses 2+= without bruits        DP/PT pulses intact and = Skin: warm and dry without rash Neuro: CNII-XII intact             Strength intact = bilaterally  EKG:  Normal sinus rhythm with poor R-wave progression across the precordium. Heart rate 96 beats per minute. Nonspecific T wave abnormality.  ASSESSMENT AND PLAN:

## 2010-04-29 NOTE — Assessment & Plan Note (Signed)
The patient has progressive angina with development of lateral ischemia on her stress Myoview scan. She has known CAD and had moderate acentric plaque beyond her stent in left circumflex at the time of her catheterization a few months ago. I suspect this plaque has become hemodynamically significant in the interim and the patient is now having worsening angina. She will be admitted to the hospital and started on intravenous heparin. She otherwise will be continued on her current medical program and will be scheduled for cardiac catheterization tomorrow. She is chest pain-free and hemodynamically stable at present. She will be directly admitted to a telemetry bed.

## 2010-04-30 LAB — CBC
HCT: 40.5 % (ref 36.0–46.0)
MCHC: 32.6 g/dL (ref 30.0–36.0)
MCV: 86.5 fL (ref 78.0–100.0)
Platelets: 293 10*3/uL (ref 150–400)
RDW: 13.6 % (ref 11.5–15.5)

## 2010-04-30 LAB — PROTIME-INR: INR: 1.14 (ref 0.00–1.49)

## 2010-04-30 LAB — GLUCOSE, CAPILLARY
Glucose-Capillary: 173 mg/dL — ABNORMAL HIGH (ref 70–99)
Glucose-Capillary: 142 mg/dL — ABNORMAL HIGH (ref 70–99)
Glucose-Capillary: 124 mg/dL — ABNORMAL HIGH (ref 70–99)

## 2010-04-30 LAB — MRSA PCR SCREENING: MRSA by PCR: NEGATIVE

## 2010-04-30 LAB — CARDIAC PANEL(CRET KIN+CKTOT+MB+TROPI): Relative Index: 2.1 (ref 0.0–2.5)

## 2010-04-30 LAB — BASIC METABOLIC PANEL: CO2: 30 mEq/L (ref 19–32)

## 2010-04-30 LAB — POCT ACTIVATED CLOTTING TIME: Activated Clotting Time: 140 seconds

## 2010-04-30 LAB — HEPARIN LEVEL (UNFRACTIONATED): Heparin Unfractionated: 0.55 IU/mL (ref 0.30–0.70)

## 2010-04-30 NOTE — Assessment & Plan Note (Signed)
Normal BP at this point.

## 2010-04-30 NOTE — Assessment & Plan Note (Signed)
Lipds reasonably well controlled on current regimen.

## 2010-04-30 NOTE — Progress Notes (Signed)
HPI:  Patient is in for followup.  She was admitted to the hospital.   On Friday her chest hurt off and on with radiation to the left arm.  She came to [facility] and was admitted and enzymes were negative.  She has several forms of discomfort, but has had some left side since she left the hospital.  She also describes it as stabbing.  She says she is not smoking now, having cut back since January.  She did smoke some after her cath procedure.  Cath procedure was reviewed again in detail with patient today..  No current facility-administered medications for this visit.   No current outpatient prescriptions on file.   Facility-Administered Medications Ordered in Other Visits  Medication Dose Route Frequency Provider Last Rate Last Dose  . regadenoson (LEXISCAN) injection SOLN 0.4 mg  0.4 mg Intravenous Once Shawnie Pons, MD   0.4 mg at 04/29/10 1229  . technetium tetrofosmin (TC-MYOVIEW) injection 11 milli Curie  11 milli Curie Intravenous Once PRN Marca Ancona, MD   11 milli Curie at 04/29/10 1230  . technetium tetrofosmin (TC-MYOVIEW) injection 33 milli Curie  33 milli Curie Intravenous Once PRN Marca Ancona, MD   33 milli Curie at 04/29/10 1355    Allergies  Allergen Reactions  . Doxycycline     REACTION: gi upset  . Miconazole Nitrate     REACTION: hives  . Prilosec (Omeprazole) Other (See Comments)    Chest pain    Past Medical History  Diagnosis Date  . Coronary artery disease     PCI of the left circumflex and LAD  . Hyperlipidemia   . Hypertension   . Obesity, unspecified     MODERATE  . Thyroid disease     HYPOTHYROIDISM  . Diabetes mellitus     TYPE II  . Anxiety state, unspecified   . Depressive disorder, not elsewhere classified   . Unspecified menopausal and postmenopausal disorder   . Simple or unspecified chronic serous otitis media   . Degeneration of lumbar or lumbosacral intervertebral disc   . Allergic rhinitis, cause unspecified   . Hx of hysterectomy      Past Surgical History  Procedure Date  . Total abdominal hysterectomy   . Lumbar fusion 01/2007    DR. Noel Gerold...3-LEVEL WITH FIXATION  . Parathyroidectomy   . Thyroidectomy   . Cardiac catheterization     PCI OF BOTH THE CIRCUMFLEX AND LEFT ANTERIOR DESCENDING ARTERY  . Cesarean section     Family History  Problem Relation Age of Onset  . Heart attack Mother 73    s/p D&C-CARDIAC ARREST 1966  . Heart attack Father 53    1978 WITH MI  . Colon cancer Neg Hx     History   Social History  . Marital Status: Divorced    Spouse Name: N/A    Number of Children: 3  . Years of Education: N/A   Occupational History  . 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     Social History Main Topics  . Smoking status: Former Smoker    Types: Cigarettes  . Smokeless tobacco: Never Used  . Alcohol Use: No  . Drug Use: No  . Sexually Active: Not on file   Other Topics Concern  . Not on file   Social History Narrative   DIVORCED3 CHILDRENPATIENT SIGNED A DESIGNATED PARTY RELEASE TO ALLOW HER DAUGHTER, TRAMAINE Grupp, TO HAVE ACCESS TO HER MEDICAL RECORDS/INFORMATION. LATOYA BATTLE,  May 04, 2009 @ 3:27 PMSmokes cigarettes on rare occasions.    ROS: Please see the HPI.  All other systems reviewed and negative.  PHYSICAL EXAM:  BP 110/74  Pulse 84  Resp 18  Ht 5\' 6"  (1.676 m)  Wt 231 lb 6.4 oz (104.962 kg)  BMI 37.35 kg/m2  General: Well developed, well nourished, in no acute distress. Head:  Normocephalic and atraumatic. Neck: no JVD Lungs: Clear to auscultation and percussion. Heart: Normal S1 and S2.  No murmur, rubs or gallops.  Abdomen:  Normal bowel sounds; soft; non tender; no organomegaly Pulses: Pulses normal in all 4 extremities. Extremities: No clubbing or cyanosis. No edema. Neurologic: Alert and oriented x 3.  EKG:  NSR. Left axis deviation.  No acute changes.    ASSESSMENT AND PLAN:

## 2010-04-30 NOTE — Progress Notes (Signed)
COPY ROUTED TO DR. Riley Kill.[name]

## 2010-04-30 NOTE — Assessment & Plan Note (Signed)
I am concerned about her episodes of discomfort.  She just underwent cath in November, and this was reviewed with the patient today.  However, I think we should go ahead and get a nuclear study to exclude important ischemia.  If pos, she might need repeat catheterization.  She understands and consents to this.  We will also begin a GI consult as we await the nuclear study.  If she has worsening symptoms, we have asked her to come again to the ER.

## 2010-05-01 DIAGNOSIS — R079 Chest pain, unspecified: Secondary | ICD-10-CM

## 2010-05-01 LAB — GLUCOSE, CAPILLARY
Glucose-Capillary: 201 mg/dL — ABNORMAL HIGH (ref 70–99)
Glucose-Capillary: 139 mg/dL — ABNORMAL HIGH (ref 70–99)
Glucose-Capillary: 187 mg/dL — ABNORMAL HIGH (ref 70–99)

## 2010-05-01 LAB — BASIC METABOLIC PANEL
BUN: 7 mg/dL (ref 6–23)
Calcium: 8.7 mg/dL (ref 8.4–10.5)
GFR calc non Af Amer: >60 mL/min (ref >60)
Potassium: 3.3 mEq/L — ABNORMAL LOW (ref 3.5–5.1)
Sodium: 139 mEq/L (ref 135–145)

## 2010-05-01 LAB — CARDIAC PANEL(CRET KIN+CKTOT+MB+TROPI)
CK, MB: 6.8 ng/mL (ref 0.3–4.0)
Relative Index: 3 — ABNORMAL HIGH (ref 0.0–2.5)
Troponin I: 1.43 ng/mL (ref 0.00–0.06)

## 2010-05-01 LAB — CBC
MCHC: 33.2 g/dL (ref 30.0–36.0)
Platelets: 261 10*3/uL (ref 150–400)
RDW: 13.5 % (ref 11.5–15.5)
WBC: 7.3 10*3/uL (ref 4.0–10.5)

## 2010-05-02 DIAGNOSIS — I214 Non-ST elevation (NSTEMI) myocardial infarction: Secondary | ICD-10-CM

## 2010-05-02 LAB — BASIC METABOLIC PANEL
CO2: 28 mEq/L (ref 19–32)
Calcium: 8.7 mg/dL (ref 8.4–10.5)
Creatinine, Ser: 0.92 mg/dL (ref 0.4–1.2)
GFR calc Af Amer: >60 mL/min (ref >60)
GFR calc non Af Amer: >60 mL/min (ref >60)
Sodium: 140 mEq/L (ref 135–145)

## 2010-05-02 LAB — GLUCOSE, CAPILLARY
Glucose-Capillary: 178 mg/dL — ABNORMAL HIGH (ref 70–99)
Glucose-Capillary: 161 mg/dL — ABNORMAL HIGH (ref 70–99)

## 2010-05-02 LAB — CBC
Hemoglobin: 12.6 g/dL (ref 12.0–15.0)
MCH: 28.2 pg (ref 26.0–34.0)
MCHC: 32.6 g/dL (ref 30.0–36.0)
Platelets: 265 10*3/uL (ref 150–400)

## 2010-05-03 ENCOUNTER — Encounter: Payer: Self-pay | Admitting: Cardiology

## 2010-05-03 ENCOUNTER — Telehealth: Payer: Self-pay | Admitting: Gastroenterology

## 2010-05-03 NOTE — Telephone Encounter (Signed)
Did she indicate why?  She was scheduled for an EGD. Did she reschedule?

## 2010-05-04 ENCOUNTER — Other Ambulatory Visit: Payer: Medicare Other | Admitting: Gastroenterology

## 2010-05-04 ENCOUNTER — Other Ambulatory Visit (HOSPITAL_COMMUNITY): Payer: Medicare Other | Admitting: Radiology

## 2010-05-04 NOTE — Telephone Encounter (Signed)
No, don't charge her

## 2010-05-05 ENCOUNTER — Ambulatory Visit (INDEPENDENT_AMBULATORY_CARE_PROVIDER_SITE_OTHER): Payer: Medicare Other | Admitting: Cardiology

## 2010-05-05 ENCOUNTER — Encounter: Payer: Self-pay | Admitting: Cardiology

## 2010-05-05 VITALS — BP 112/70 | HR 74 | Resp 18 | Ht 66.0 in | Wt 229.0 lb

## 2010-05-05 DIAGNOSIS — I10 Essential (primary) hypertension: Secondary | ICD-10-CM

## 2010-05-05 DIAGNOSIS — I714 Abdominal aortic aneurysm, without rupture, unspecified: Secondary | ICD-10-CM

## 2010-05-05 DIAGNOSIS — E785 Hyperlipidemia, unspecified: Secondary | ICD-10-CM

## 2010-05-05 DIAGNOSIS — F17200 Nicotine dependence, unspecified, uncomplicated: Secondary | ICD-10-CM

## 2010-05-05 DIAGNOSIS — I2510 Atherosclerotic heart disease of native coronary artery without angina pectoris: Secondary | ICD-10-CM

## 2010-05-05 NOTE — Patient Instructions (Addendum)
Your physician recommends that you schedule a follow-up appointment in: 2 week Your physician has requested that you have an abdominal aorta duplex.In 6 months During this test, an ultrasound is used to evaluate the aorta. Allow 30 minutes for this exam. Do not eat after midnight the day before and avoid carbonated beverages Please get appointment with GI

## 2010-05-12 ENCOUNTER — Telehealth: Payer: Self-pay | Admitting: Cardiology

## 2010-05-12 NOTE — Assessment & Plan Note (Signed)
Asked her to remain on her medication which she recently stopped.  She has restarted.  It is a financial issue.

## 2010-05-12 NOTE — Assessment & Plan Note (Signed)
At target. 

## 2010-05-12 NOTE — Assessment & Plan Note (Signed)
Will continue to monitor.

## 2010-05-12 NOTE — Telephone Encounter (Signed)
I spoke with the pt and she had a cardiac cath on 04/30/10 through left femoral artery.  Over the past two days the pt has noticed swelling in the top of her left foot and pain in foot.  The pt said the bottom of her foot feels numb.  The pt denies any discoloration to foot and it is not warm to touch.  The pt currently has polish on her toe nails and I asked her to take the polish off to make sure the nails are not discolored (she will do this later tonight).  The pt has been elevating her foot and this has not helped with her symptoms. I instructed the pt to try and remain off of her foot and continue elevating leg. I made the pt aware that she will most likely need a duplex of her left leg and I will forward this message to Dr Riley Kill for the order.  I will call the pt back tomorrow with plan.

## 2010-05-12 NOTE — Progress Notes (Signed)
HPI:  She is much improved.  Opened the CFX with a BMS.   Medication compliance remains an issue of concern.  She makes decisions with her meds that are spur of the moment, and does not always take.  Need for DAPT discussed at length.  Her syptoms are markedly improved.   Cath note from [facility] copied:    CONCLUSIONS:   1. Preserved overall left ventricular function with a new wall motion       abnormality involving the mid inferior segment.   2. New total occlusion of the circumflex coronary artery with a total       occlusion of the circumflex distal to the stent site with       successful reopening with a non-drug-eluting platform.   3. Other findings as noted above.      DISPOSITION:  This was a difficult case.  The patient is typically   restless even with good sedation.  Importantly, there is new total   occlusion compared to November.  The patient has stopped her Plavix as   well as her Crestor.  She has smoked some although has claimed not to   too much.  Risk factor adjustment will clearly be necessary.  We chose a   non-drug-eluting stent because of her history of stopping Plavix in the   past without really reporting it.  A lot of this is financially   mediated.  I will continue to discuss the options with her, and we will   do everything we can to try to get her samples.  Current Outpatient Prescriptions  Medication Sig Dispense Refill  . aspirin 81 MG EC tablet Take 81 mg by mouth daily.        [name] atenolol (TENORMIN) 25 MG tablet Take 25 mg by mouth daily.       . Diazepam (VALIUM PO) Take 1 tablet by mouth as needed.        . fexofenadine (ALLEGRA) 180 MG tablet Take 180 mg by mouth daily as needed.        [name] levothyroxine (SYNTHROID, LEVOTHROID) 150 MCG tablet Take 150 mcg by mouth daily.        . metFORMIN (GLUCOPHAGE-XR) 500 MG 24 hr tablet 500 mg. 2 tablets AM and 1 tablet PM      . nitroGLYCERIN (NITROSTAT) 0.4 MG SL tablet Place 0.4 mg under the tongue every 5 (five)  minutes as needed.        . prasugrel (EFFIENT) 10 MG TABS Take by mouth.        . rosuvastatin (CRESTOR) 20 MG tablet Take 20 mg by mouth daily.          Allergies  Allergen Reactions  . Doxycycline     REACTION: gi upset  . Miconazole Nitrate     REACTION: hives  . Prilosec (Omeprazole) Other (See Comments)    Chest pain    Past Medical History  Diagnosis Date  . Coronary artery disease     PCI of the left circumflex and LAD  . Hyperlipidemia   . Hypertension   . Obesity, unspecified     MODERATE  . Thyroid disease     HYPOTHYROIDISM  . Diabetes mellitus     TYPE II  . Anxiety state, unspecified   . Depressive disorder, not elsewhere classified   . Unspecified menopausal and postmenopausal disorder   . Simple or unspecified chronic serous otitis media   . Degeneration of lumbar or lumbosacral intervertebral disc   .  Allergic rhinitis, cause unspecified   . Hx of hysterectomy     Past Surgical History  Procedure Date  . Total abdominal hysterectomy   . Lumbar fusion 01/2007    DR. Noel Gerold...3-LEVEL WITH FIXATION  . Parathyroidectomy   . Thyroidectomy   . Cardiac catheterization     PCI OF BOTH THE CIRCUMFLEX AND LEFT ANTERIOR DESCENDING ARTERY  . Cesarean section     Family History  Problem Relation Age of Onset  . Heart attack Mother 75    s/p D&C-CARDIAC ARREST 1966  . Heart attack Father 27    1978 WITH MI  . Colon cancer Neg Hx     History   Social History  . Marital Status: Divorced    Spouse Name: N/A    Number of Children: 3  . Years of Education: N/A   Occupational History  . 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     Social History Main Topics  . Smoking status: Former Smoker    Types: Cigarettes  . Smokeless tobacco: Never Used  . Alcohol Use: No  . Drug Use: No  . Sexually Active: Not on file   Other Topics Concern  . Not on file   Social History Narrative   DIVORCED3 CHILDRENPATIENT SIGNED A DESIGNATED PARTY RELEASE  TO ALLOW HER DAUGHTER, TRAMAINE Schrader, TO HAVE ACCESS TO HER MEDICAL RECORDS/INFORMATION. Daphane Shepherd, May 04, 2009 @ 3:27 PMSmokes cigarettes on rare occasions.    ROS: Please see the HPI.  All other systems reviewed and negative.  PHYSICAL EXAM:  BP 112/70  Pulse 74  Resp 18  Ht 5\' 6"  (1.676 m)  Wt 229 lb (103.874 kg)  BMI 36.96 kg/m2  General: Well developed, well nourished, in no acute distress. Head:  Normocephalic and atraumatic. Neck: no JVD Lungs: Clear to auscultation and percussion. Heart: Normal S1 and S2.  No murmur, rubs or gallops.  Abdomen:  Normal bowel sounds; soft; non tender; no organomegaly Pulses: Pulses normal in all 4 extremities.  Wrist looks good at present.   Extremities: No clubbing or cyanosis. No edema. Neurologic: Alert and oriented x 3.  EKG:  NSR.  Left axis.  Delay in R wave progression. ASSESSMENT AND PLAN:

## 2010-05-12 NOTE — Assessment & Plan Note (Signed)
See recent admission notes in [facility].  She had recurrent pain, large ischemic defect.  Now better since PCI of CFX.  On DAPT, and need for reemphasized.  Continue current medications.

## 2010-05-12 NOTE — Assessment & Plan Note (Signed)
Reinforced.

## 2010-05-12 NOTE — Telephone Encounter (Signed)
Had cath last week and left foot is swollen x 2 days.

## 2010-05-13 NOTE — Telephone Encounter (Signed)
The pt called back and will come into the office later today for me to look at her foot.

## 2010-05-13 NOTE — Telephone Encounter (Signed)
I spoke with Dr Riley Kill and he would like the pt to come into the office for a nurse visit to assess her left foot (pulse check).  Dr Riley Kill does not feel that the pt needs a duplex at this time.  I left a message for the pt to call back to arrange nurse visit.

## 2010-05-13 NOTE — Telephone Encounter (Signed)
The pt came into the office and she has only mild swelling on the outside of her left foot. The entire foot is not swollen.  The foot is not discolored or warm to touch.  The pt has a 2+ pedal pulse. Nail beds are within normal without any bluish discoloration. The pt denies pain in her foot today and she states that the swelling has improved since yesterday.  The pt does not remember injuring her foot.  I instructed the pt to continue to elevate her foot when possible.  Pt agreed with plan.  The pt will call our office with any other questions or concerns. I will forward this message to Dr Riley Kill to make him aware that the pt came into the office.

## 2010-05-20 ENCOUNTER — Encounter: Payer: Self-pay | Admitting: Cardiology

## 2010-05-20 ENCOUNTER — Ambulatory Visit (INDEPENDENT_AMBULATORY_CARE_PROVIDER_SITE_OTHER): Payer: Medicare Other | Admitting: Cardiology

## 2010-05-20 DIAGNOSIS — I714 Abdominal aortic aneurysm, without rupture, unspecified: Secondary | ICD-10-CM

## 2010-05-20 DIAGNOSIS — I10 Essential (primary) hypertension: Secondary | ICD-10-CM

## 2010-05-20 DIAGNOSIS — I2510 Atherosclerotic heart disease of native coronary artery without angina pectoris: Secondary | ICD-10-CM

## 2010-05-20 DIAGNOSIS — E785 Hyperlipidemia, unspecified: Secondary | ICD-10-CM

## 2010-05-20 NOTE — Assessment & Plan Note (Signed)
Controlled at present.  

## 2010-05-20 NOTE — Assessment & Plan Note (Signed)
No current chest pain.  Improved. Plan follow up in two months.

## 2010-05-20 NOTE — Progress Notes (Signed)
HPI:  Patient is in for follow up.  She is not having any significant chest pain at this point in time.  Overall, doing well.  Not smoking at this point.  Feels good.  No complaints.  She is taking her medication, and I showed her a picture of a scanning EM of a quiet and activated platelet so she would understand the importance of medical compliance.    Current Outpatient Prescriptions  Medication Sig Dispense Refill  . aspirin 81 MG EC tablet Take 81 mg by mouth daily.        [name] atenolol (TENORMIN) 25 MG tablet Take 25 mg by mouth daily.       . Diazepam (VALIUM PO) Take 1 tablet by mouth as needed.        . fexofenadine (ALLEGRA) 180 MG tablet Take 180 mg by mouth daily as needed.        [name] levothyroxine (SYNTHROID, LEVOTHROID) 150 MCG tablet Take 150 mcg by mouth daily.        . metFORMIN (GLUCOPHAGE-XR) 500 MG 24 hr tablet 500 mg. 2 tablets AM and 1 tablet PM      . nitroGLYCERIN (NITROSTAT) 0.4 MG SL tablet Place 0.4 mg under the tongue every 5 (five) minutes as needed.        . prasugrel (EFFIENT) 10 MG TABS Take by mouth.        . rosuvastatin (CRESTOR) 20 MG tablet Take 20 mg by mouth daily.          Allergies  Allergen Reactions  . Doxycycline     REACTION: gi upset  . Miconazole Nitrate     REACTION: hives  . Prilosec (Omeprazole) Other (See Comments)    Chest pain    Past Medical History  Diagnosis Date  . Coronary artery disease     PCI of the left circumflex and LAD  . Hyperlipidemia   . Hypertension   . Obesity, unspecified     MODERATE  . Thyroid disease     HYPOTHYROIDISM  . Diabetes mellitus     TYPE II  . Anxiety state, unspecified   . Depressive disorder, not elsewhere classified   . Unspecified menopausal and postmenopausal disorder   . Simple or unspecified chronic serous otitis media   . Degeneration of lumbar or lumbosacral intervertebral disc   . Allergic rhinitis, cause unspecified   . Hx of hysterectomy     Past Surgical History  Procedure Date    . Total abdominal hysterectomy   . Lumbar fusion 01/2007    DR. Noel Gerold...3-LEVEL WITH FIXATION  . Parathyroidectomy   . Thyroidectomy   . Cardiac catheterization     PCI OF BOTH THE CIRCUMFLEX AND LEFT ANTERIOR DESCENDING ARTERY  . Cesarean section     Family History  Problem Relation Age of Onset  . Heart attack Mother 50    s/p D&C-CARDIAC ARREST 1966  . Heart attack Father 60    1978 WITH MI  . Colon cancer Neg Hx     History   Social History  . Marital Status: Divorced    Spouse Name: N/A    Number of Children: 3  . Years of Education: N/A   Occupational History  . 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     Social History Main Topics  . Smoking status: Former Smoker    Types: Cigarettes  . Smokeless tobacco: Never Used  . Alcohol Use: No  .  Drug Use: No  . Sexually Active: Not on file   Other Topics Concern  . Not on file   Social History Narrative   DIVORCED3 CHILDRENPATIENT SIGNED A DESIGNATED PARTY RELEASE TO ALLOW HER DAUGHTER, TRAMAINE Nygaard, TO HAVE ACCESS TO HER MEDICAL RECORDS/INFORMATION. Daphane Shepherd, May 04, 2009 @ 3:27 PMSmokes cigarettes on rare occasions.    ROS: Please see the HPI.  All other systems reviewed and negative.  PHYSICAL EXAM:  BP 100/60  Pulse 72  Resp 18  Ht 5\' 6"  (1.676 m)  Wt 229 lb 12.8 oz (104.237 kg)  BMI 37.09 kg/m2  General: Well developed, well nourished, in no acute distress. Eye:  Some redness of left sclerae  (?allergies) Head:  Normocephalic and atraumatic. Neck: no JVD Lungs: Clear to auscultation and percussion. Heart: Normal S1 and S2.  No murmur, rubs or gallops.  Abdomen:  Normal bowel sounds; soft; non tender; no organomegaly Pulses: Pulses normal in all 4 extremities. Extremities: No clubbing or cyanosis. No edema. Neurologic: Alert and oriented x 3.  EKG:  ASSESSMENT AND PLAN:

## 2010-05-20 NOTE — Assessment & Plan Note (Signed)
Follow up in the fall on size.

## 2010-05-20 NOTE — Patient Instructions (Signed)
Your physician recommends that you schedule a follow-up appointment in: 2 MONTHS  Your physician recommends that you continue on your current medications as directed. Please refer to the Current Medication list given to you today.   

## 2010-05-20 NOTE — Assessment & Plan Note (Signed)
Now on Crestor again.  Tolerating.

## 2010-05-23 ENCOUNTER — Encounter: Payer: Self-pay | Admitting: Cardiology

## 2010-05-23 ENCOUNTER — Ambulatory Visit (HOSPITAL_BASED_OUTPATIENT_CLINIC_OR_DEPARTMENT_OTHER): Payer: Medicare Other | Attending: Cardiology

## 2010-05-23 DIAGNOSIS — G4710 Hypersomnia, unspecified: Secondary | ICD-10-CM | POA: Insufficient documentation

## 2010-06-01 NOTE — Letter (Signed)
January 05, 2007    Dr. Sharolyn Douglas  [address], [address]  [place], [place]  [address]   RE:  KATLIN, BORTNER  MRN:  [id number]  /  DOB:  05/18/1945   Dear Dr. Noel Gerold:   Ms. Mazo is a patient under my care.  She has previously undergone  implantation of a drug-eluting stent.  My understanding is that she is  in need of surgery.  Because she has drug-eluting stents she is at risk  to some extent for stent thrombosis even though she is out of the date.  Because of this, she can come off Plavix for approximately 5-7 days  prior to her procedure.  Because of the presence of the stents, she  should likely remain on aspirin at 81 mg even throughout.  Discontinuation of both would be associated with some increased risk in  stent thrombosis.  Please feel free to give me a call if you have any  questions, otherwise she is reasonably stable at the present time.    Sincerely,      Arturo Morton. Riley Kill, MD, [facility]  Electronically Signed    TDS/MedQ  DD: 01/05/2007  DT: 01/06/2007  Job #: [id number]

## 2010-06-01 NOTE — Assessment & Plan Note (Signed)
[facility] HEALTHCARE                            CARDIOLOGY OFFICE NOTE   BIRDELL, FRASIER                       MRN:          [id number]  DATE:12/12/2007                            DOB:          [birth year]    [name] is in for followup visit.  She says she is not smoking, and  she would like currently to remain on Plavix.  We have talked about a  variety of things at the present time.  She denies current chest pain.   CURRENT MEDICATIONS:  1. Synthroid 150 mcg daily.  2. Plavix 75 mg daily.  3. Crestor 20 mg daily.  4. Aspirin 81 mg daily.  5. Metformin 500 mg p.o. b.i.d.  6. Atenolol 25 mg b.i.d.  7. Metformin 500 mg 2 tablets in the morning and 1 in the p.m.  8. She has been taking clarithromycin for 10 days.   PHYSICAL EXAMINATION:  VITAL SIGNS:  The weight is 237 pounds, blood  pressure 115/70, and pulse is 83.  LUNGS:  The lung fields are clear to auscultation and percussion.  HEART:  The cardiac rhythm is regular.  There is no definite murmur  noted.  EXTREMITIES:  No edema.   LABORATORY DATA:  LDL cholesterol was 68.  Urine pH was 5.5.  Hemoglobin  was14.9.  Importantly, serum glucose was 146, hemoglobin A1c was 6.8,  and TSH was 1.57.   Ms. Hebdon electrocardiogram demonstrates normal sinus rhythm with  leftward-oriented axis.   IMPRESSION:  1. Coronary artery disease with prior multivessel coronary      intervention.  2. Hypercholesterolemia.  3. Hypothyroidism.  4. Glucose intolerance.  5. Moderate obesity.   I have discussed her situation with her.  She would clearly benefit from  weight loss both from the standpoint of need for medications.  She  clearly understands the need for this from a cardiac standpoint.  She  remained stable.  Her lipids are within the guidelines.  She is not  smoking.  She does have risk for further cardiac disease.  We will see  her back in followup in 6 month's time.     Arturo Morton. Riley Kill, MD,  [facility]  Electronically Signed    TDS/MedQ  DD: 12/16/2007  DT: 12/16/2007  Job #: [id number]

## 2010-06-01 NOTE — Assessment & Plan Note (Signed)
[facility] HEALTHCARE                            CARDIOLOGY OFFICE NOTE   VERSA, CRATON                       MRN:          [id number]  DATE:08/28/2006                            DOB:          11/20/1945    Ms. Dobratz is in for followup.  She has been undergoing evaluation.  She  had a colonoscopy in April of this year and at that time went off of her  Plavix.  Importantly, she then was evaluated for laminectomy.  The plan  was to go ahead with this, but a second opinion has been arranged.  As a  result of this, none of this has been done and she has remained off  Plavix.  She has had some mild, intermittent chest discomfort although  none of it has sounded prolonged or anything beyond what she has had in  the past.   Her medications include:  1. Synthroid 150 mcg daily.  2. Plavix 75 mg daily, which she is not taking currently.  3. Crestor 20 mg daily.  4. Aspirin 81 mg daily.  5. Metformin 500 mg daily.  6. Atenolol 25 mg p.o. b.i.d.   Today on examination, the blood pressure is 110/70 and the pulse is 84.  The lung fields are clear.  There is a minimal systolic ejection murmur  noted.   Her EKG reveals normal sinus rhythm with leftward-oriented axis.   IMPRESSION:  1. Coronary artery disease status post percutaneous coronary      intervention of both the circumflex and left anterior descending      artery, as well as previous treatment of the right coronary artery      for acute myocardial infarction.  2. Hypercholesterolemia on lipid-lowering therapy.  3. History of laminectomy.  4. History of colonoscopy.   PLAN:  1. Ms. Suchecki will restart her Plavix until it is clear what the      longterm plan for her laminectomy is.  I told her that she could      stop it 5 days prior to the procedure and that I would personally      recommend continuation of aspirin during and after the procedure.  2. If she has recurrent chest pain, she is to call  us.     Arturo Morton. Riley Kill, MD, [facility]  Electronically Signed    TDS/MedQ  DD: 08/29/2006  DT: 08/30/2006  Job #: [id number]

## 2010-06-01 NOTE — Assessment & Plan Note (Signed)
[facility] HEALTHCARE                            CARDIOLOGY OFFICE NOTE   [name], [name]                       MRN:          [id number]  DATE:05/22/2007                            DOB:          1945-09-02    [name] is in for followup.  From a clinical standpoint she is stable.  She has not been having any ongoing chest pain or shortness of breath.  She went ahead and had her back surgery done, and she is clinically  much, much better.  She denies active symptoms.  She also says she is  not smoking.   She is back on her aspirin and Plavix following her surgery.   Medications include aspirin 81 mg daily, metformin 500 mg p.o. b.i.d.,  atenolol 25 mg p.o. b.i.d., Crestor 20 mg daily, Plavix 75 mg daily,  Synthroid 150 mcg daily.   On physical, she is alert and oriented, in no distress.  Blood pressure is 145/73, the pulse 64.  The lung fields are clear.  CARDIAC:  Rhythm is regular.  She has a thyroid scar.   Electrocardiogram demonstrates normal sinus rhythm with left axis  deviation.   IMPRESSION:  1. Coronary disease.  2. Hypercholesterolemia.  3. Hypothyroidism.  4. Glucose intolerance.   I did inquire whether she was checking her sugars and she said she does  not have a machine.  She says she is not even sure she needs to be on  the medication.     Arturo Morton. Riley Kill, MD, [facility]  Electronically Signed    TDS/MedQ  DD: 05/22/2007  DT: 05/22/2007  Job #: [id number]

## 2010-06-01 NOTE — Assessment & Plan Note (Signed)
[facility] HEALTHCARE                            CARDIOLOGY OFFICE NOTE   [name], [name]                       MRN:          [id number]  DATE:10/24/2006                            DOB:          [birth year]    [name] is in for followup. She is doing reasonably well. She has  gotten two separate opinions about what to do about her back. It still  bothers her. She still continues to remain a little bit emotional at  times.   CURRENT MEDICATIONS:  1. Synthroid 150 mcg daily.  2. Plavix 75 mg daily.  3. Crestor 20 mg daily.  4. Aspirin 81 mg daily.  5. Metformin 500 mg b.i.d.  6. Atenolol 25 mg p.o. b.i.d.   PHYSICAL EXAMINATION:  She is alert and oriented. She is a little bit  tearful at times. Blood pressure 122/60. Pulse 84.  Lung fields are clear.  Cardiac rhythm is regular. No significant murmurs are appreciated.  There is no extremity edema.   Recent hemoglobin A1c done in the general medicine clinic revealed an  hemoglobin A1c of 7.0.   IMPRESSION:  1. Coronary artery disease.  2. Hypercholesterolemia, on lipid lowering therapy with an LDL      cholesterol in June of 98.  3. Glucose intolerance.  4. Occasional tobacco use.   RECOMMENDATIONS:  1. Repeat lipid and liver profile.  2. Return to clinic in six months.  3. Continue encouraged weight loss, increased activity and      discontinuation of smoking.  4. We will await to hear from her regarding her need for back surgery      as the patient has prior history of drug-eluting stent placement.     Arturo Morton. Riley Kill, MD, [facility]  Electronically Signed    TDS/MedQ  DD: 10/24/2006  DT: 10/24/2006  Job #: [id number]   cc:   Rosalyn Gess. Norins, MD

## 2010-06-02 ENCOUNTER — Other Ambulatory Visit: Payer: Self-pay | Admitting: *Deleted

## 2010-06-02 MED ORDER — PRASUGREL HCL 10 MG PO TABS: 10.0000 mg | ORAL_TABLET | Freq: Every day | ORAL | Status: DC

## 2010-06-03 ENCOUNTER — Ambulatory Visit (AMBULATORY_SURGERY_CENTER): Payer: Medicare Other | Admitting: Gastroenterology

## 2010-06-03 ENCOUNTER — Encounter: Payer: Self-pay | Admitting: Gastroenterology

## 2010-06-03 DIAGNOSIS — R079 Chest pain, unspecified: Secondary | ICD-10-CM

## 2010-06-03 MED ORDER — SODIUM CHLORIDE 0.9 % IV SOLN: 500.0000 mL | INTRAVENOUS | Status: DC

## 2010-06-03 NOTE — Patient Instructions (Signed)
Review the handouts given to you by your recovery room nurse.   Resume all of your routine medications.   Take it easy today.  Call us if you have any questions or concerns at [phone].  Thank-you.

## 2010-06-04 ENCOUNTER — Telehealth: Payer: Self-pay

## 2010-06-04 NOTE — Discharge Summary (Signed)
NAMELATOSHIA, MONRROY                ACCOUNT NO.:  [id number]   MEDICAL RECORD NO.:  [id number]          PATIENT TYPE:  INP   LOCATION:  6522                         FACILITY:  [facility]   PHYSICIAN:  Arturo Morton. Riley Kill, M.D. [facility] OF BIRTH:  [birth year]   DATE OF ADMISSION:  02/07/2004  DATE OF DISCHARGE:  02/11/2004                                 DISCHARGE SUMMARY   ADDENDUM   Ms. Droge was evaluated by Dr. Riley Kill on February 10, 2004.  At that time,  she was having some weakness.  She did not have any specific complaints of  pain but had some general malaise.  Dr. Riley Kill felt that she should be held  over for one more day.  During that period of time, she was evaluated by  cardiac rehab and was able to ambulate without chest pain or shortness of  breath.  By February 11, 2004, she was ambulating 500 feet.  She was also  seen by __________ [facility] and the importance of taking aspirin 325 mg and  Plavix 75 mg a day was stressed.  She was given a 30-day supply by the  research facility but has insurance and co-pay up to $25.  Ms. Westcott was  considered stable for discharge on February 11, 2004 with outpatient followup  arranged.      RB/MEDQ  D:  02/11/2004  T:  02/11/2004  Job:  [id number]   cc:   Rosalyn Gess. Norins, M.D. [facility]   Maisie Fus D. Riley Kill, M.D. [facility]

## 2010-06-04 NOTE — Cardiovascular Report (Signed)
[facility]  Patient:    [name], [name] Visit Number: [id number] MRN: [id number]          Service Type: MED Location: [id number] Attending Physician:  Ronaldo Miyamoto Dictated by:   Everardo Beals Juanda Chance, M.D. [facility] Proc. Date: 06/13/01 Admit Date:  06/11/2001   CC:         Madolyn Frieze. Jens Som, M.D. [facility]  Maisie Fus D. Riley Kill, M.D. [facility]  Cardiopulmonary Lab   Cardiac Catheterization  CLINICAL HISTORY:  [name] is 65 years old and had a previous diaphragmatic wall infarction, treated with PTCA in 1998.  She was last cathed in 2000 and was involved in the reversal trial, but did not meet LDL criteria for randomization.  She was admitted with unstable angina and cathed earlier today by Dr. Eden Emms, who found a tight lesion in the circumflex marginal vessel.  We plan to treat the marginal vessel and she was enrolled in the Asteroid Trial; we plan to perform IVUS on the LAD.  DESCRIPTION OF PROCEDURE:  The procedure was performed by the right femoral artery.  The old sheath was exchanged for a new 7-French sheath.  The patient was given weight-adjusted heparin to prolong the ACT to greater than 200 sec, and was given double bolus Integrilin and infusion.  A 3.5 7-French Voda guiding catheter and a Forte wire was used.  We passed the wire into the circumflex marginal vessel without difficulty.  We then deployed a 3.5 x 24 mm Express in the mid circumflex artery into the marginal branch, crossing the AV branch (which was a small vessel).  We deployed this with one inflation of 10 atm for 30 sec, and then we post-dilated with a 3.5 x 20 mm Quantum Maverick, performing one inflation of 15 atm for 30 sec.  This did not appear to compromise the AV branch.  We then rewired the wire down the LAD, and using an Atlantis catheter, performed IVUS with automatic pullback.  We gave nitroglycerin before passing the IVUS catheter, and positioned the IVUS catheter just  past a diagonal branch and a septal perforator.  Repeat diagnostic angiogram was then performed of the LAD.  The patient tolerated the procedure well and left the laboratory in satisfactory condition.  RESULTS: Initially the stenosis in the proximal portion of the circumflex marginal vessel was estimated at 90%.  Following stenting this improved to less than 10%.  The IVUS findings in the LAD showed a mild amount of plaque.  The worst area showed about 40% of the vessel area filled with plaque, although the lumen was not significantly compromised.  CONCLUSIONS: 1. Successful stenting of the circumflex marginal vessel.  Improvement in    percent luminal narrowing from 90% to less than 10%. 2. IVUS of the left anterior descending artery showing a moderate amount of    plaque, as part of the Asteroid Trial.  DISPOSITION:  The patient transferred to post-anesthesia unit for further observation. Dictated by:   Everardo Beals Juanda Chance, M.D. [facility] Attending Physician:  Ronaldo Miyamoto DD:  06/13/01 TD:  06/15/01 Job: [id number]

## 2010-06-04 NOTE — H&P (Signed)
[name]                ACCOUNT NO.:  [id number]   MEDICAL RECORD NO.:  [id number]          PATIENT TYPE:  INP   LOCATION:  1823                         FACILITY:  [facility]   PHYSICIAN:  Charlies [name], M.D. [facility] DATE OF BIRTH:  Jan 25, 1945   DATE OF ADMISSION:  02/07/2004  DATE OF DISCHARGE:                                HISTORY & PHYSICAL   CHIEF COMPLAINT:  Chest pain.   CLINICAL HISTORY:  [name] is a very nice 65 year old African American  woman who has documented coronary disease.  She had a stent placed in 2003  and was entered in the ASTEROID Protocol at that time and treated with  Crestor, and had a followup catheterization with IVUS performed in June of  2005.  This showed mostly nonobstructive disease with 80% narrowing in a  diagonal branch, which was treated medically.  Her stent was patent.   She did quite well until yesterday, when at work she had an episode of pain  which lasted just a few minutes.  Last night about 10 o'clock, she had  severe substernal chest pain associated with diaphoresis and some shortness  of breath.  This persisted with intermittent severity through the night and  she came to the emergency room early this morning.  Pain was relieved in the  emergency room with nitroglycerin.   PAST MEDICAL HISTORY:  Her past medical history is significant for:  1.  Treated hypothyroidism.  2.  Hyperlipidemia.   CURRENT MEDICATIONS:  Current medications include:  1.  Atenolol 25 mg daily.  2.  Synthroid 150 mcg daily.  3.  Aspirin 325 mg daily.   ALLERGIES:  She has no known allergies.   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  She does  not smoke.   REVIEW OF SYSTEMS:  She has recently had a knee injection with steroids for  degenerative disease.   FAMILY HISTORY:  For her family history, please see old record.   PHYSICAL EXAMINATION:  VITAL SIGNS:  On physical examination, blood pressure  was 100/58 and the pulse  80 and regular.  NECK:  There was no venous distention.  The carotid pulses were full and  without bruits.  CHEST:  Chest was clear without rales or rhonchi or wheezes.  CARDIAC:  The cardiac rhythm was regular.  The first and second heart sounds  were normal; sounds were decreased.  There were no murmurs or gallops.  ABDOMEN:  The abdomen was soft.  Bowel sounds were normal and there was no  hepatosplenomegaly.  EXTREMITIES:  There was trace peripheral edema.  The pedal pulses were full.  MUSCULOSKELETAL:  The musculoskeletal showed no deformities.  SKIN:  Skin was warm and dry.  NEUROLOGICAL:  Examination revealed no focal neurological signs.   LABORATORY AND ACCESSORY CLINICAL DATA:  An ECG was normal.   Point-of-care markers were negative x1.  Renal function and CBC were normal.   IMPRESSION:  1.  Prolonged chest pain consistent with acute coronary syndrome.  2.  Coronary artery disease, status post prior stent of the  circumflex      artery in 2003 with mostly nonobstructive disease at catheterization in      June 2005.  3.  Hyperlipidemia, but not on recent statin therapy.   PLAN:  We will plan to admit the patient to the hospital for observation and  to rule out myocardial infarction.  We will treat her with IV heparin and IV  nitroglycerin, and adjust her beta blocker upward.  If her troponins are  positive, we will add a IIb/IIIa inhibitor.  I will plan evaluation with  coronary angiography on Monday.       BB/MEDQ  D:  02/07/2004  T:  02/07/2004  Job:  [id number]   cc:   Rosalyn Gess. Norins, M.D. [facility]   Maisie Fus D. Riley Kill, M.D. [facility]

## 2010-06-04 NOTE — Cardiovascular Report (Signed)
NAME:  [name], [name]                          ACCOUNT NO.:  [id number]   MEDICAL RECORD NO.:  [id number]                   PATIENT TYPE:  OIB   LOCATION:  2855                                 FACILITY:  [facility]   PHYSICIAN:  Charlies Constable, M.D. [facility]              DATE OF BIRTH:  1945/01/24   DATE OF PROCEDURE:  06/26/2003  DATE OF DISCHARGE:  06/26/2003                              CARDIAC CATHETERIZATION   CLINICAL HISTORY:  Mrs. Trettin is 65 years old and two years ago had a stent  placed to the circumflex artery.  At that time, she had IVUS of the LAD as  part of the Asteroid protocol.  She has done quite well since that time and  was seen recently in the office and set up for followup catheterization and  IVUS as part of the Asteroid protocol.   PROCEDURE:  The procedure was performed via the right femoral artery using  arterial sheath and 6 French preformed coronary catheters. A front wall  arterial puncture was performed and Omnipaque contrast was used.  After  completion of the diagnostic study, we gave her 50 units per kg of heparin  performing an ACT of greater than 200 seconds to perform an IVUS study.  We  used a 6 [place] JL-3.5 guiding catheter and a soft Asahi wire.  Passed the  wire down the LAD without difficulty.  After giving nitroglycerin and  passing Atlantis ultrasound catheter down the LAD and did automatic  pullback.  The patient developed chest pain with the IVUS run although there  were no ST changes.  Repeat angiography in the artery showed no evidence of  spasm.  Nevertheless, she continued to have some chest pain on the table.  A  12-lead EKG showed no changes.  We then closed the right femoral artery with  Angio-Seal.   The patient received a total of 3 mg of Versed and 75 mg of Fentanyl during  the procedure, but still was awake during the procedure.   RESULTS:  Left main coronary artery:  The left main coronary was free of  significant disease.   Left  anterior descending artery:  The left anterior descending artery gave  rise to a diagonal branch, several septal perforators and  a second diagonal  branch.  There was 30% narrowing in the proximal LAD.  There was 80%  narrowing in the proximal portion of the diagonal branch.   Circumflex artery:  The circumflex artery gave rise to a small ramus branch,  marginal branch and two posterior lateral branches.  There was 0% stenosis  at the stent in the marginal branch extending into the AV branch.   Right coronary artery:  The right coronary artery was a moderate size vessel  that gave rise to a conus branch, free right ventricular branch, posterior  descending branch and a posterior lateral branch.  There is 50% narrowing in  the proximal right coronary artery and 30% narrowing in the mid right  coronary artery.  There were irregularities in the mid to distal right  coronary artery.   LEFT VENTRICULOGRAM:  The left ventriculogram performed in the RAO  projection showed good wall motion with no areas of hypokinesis.  The  estimated ejection fraction is 60%.   The IVUS run in the LAD showed a moderate amount of plaque in the LAD, but  none of this plaque was obstructed.   CONCLUSION:  1. Coronary artery disease status post prior stenting of the circumflex     artery in 2003 with 30% narrowing in the proximal left anterior     descending, 80% narrowing in the first diagonal branch (somewhat worse),     0% stenosis at the stent site in the circumflex marginal vessel, 50%     proximal and 30% mid stenosis in the right coronary artery with normal     left ventricular function.  2. Moderate plaque by IVUS as part of the Asteroid protocol.   RECOMMENDATIONS:  Plan continued medical therapy.  Will plan to let the  patient go home as an outpatient later today.                                               Charlies Constable, M.D. [facility]    BB/MEDQ  D:  06/26/2003  T:  06/27/2003  Job:  [id number]    cc:   Rosalyn Gess. Norins, M.D. [facility]   Maisie Fus D. Riley Kill, M.D. [facility]

## 2010-06-04 NOTE — Assessment & Plan Note (Signed)
[facility] HEALTHCARE                            CARDIOLOGY OFFICE NOTE   JULIA, ALKHATIB                       MRN:          [id number]  DATE:12/16/2005                            DOB:          [birth year]    Ms. Dimaano is in for follow up. She is not having any complaints other  than hot flashes.   She underwent cardiac catheterization. Her stents are patent. She does  have some disease as is noted in the catheterization report. She  recently saw Dr. Debby Bud and had a urinary tract infection which is under  treatment at the present time. She has decided to go with physical  therapy as opposed to having back surgery at the present time.   MEDICATIONS:  Include Synthroid 150 mcg daily, Plavix 75 mg daily,  Crestor 20 mg daily, Atenolol 25 mg b.i.d., enteric coated aspirin 325  mg daily and Mobic 15 mg daily.   PHYSICAL EXAMINATION:  GENERAL: She is an alert, oriented female.  VITAL SIGNS: Blood pressure is 126/84, pulse 77.  GROIN: Is well healed without evidence of bruit or hematoma.   IMPRESSION:  1. Coronary disease status post multivessel percutaneous coronary      intervention, status stable.  2. Hypothyroidism on treatment.  3. Urinary tract infection, on treatment.  4. Hypertension on treatment.  5. Hypercholesterolemia on lipid lowering therapy with borderline      liver function abnormalities.   PLAN:  I reemphasized today that she can cut down her aspirin to 81 mg  daily. We have discussed her nonsteroidals. She should remain on Crestor  at the present time. I would also continue Plavix, if she were to have  surgery I would continue Plavix until 5 days prior to surgery but I  would continue the aspirin throughout at 81 mg a day. Her most recent  lipid numbers have improved substantially and she should remain on  Crestor at the present time. She should refrain from smoking.     Arturo Morton. Riley Kill, MD, [facility]  Electronically Signed    TDS/MedQ  DD: 12/16/2005  DT: 12/17/2005  Job #: [id number]

## 2010-06-04 NOTE — Discharge Summary (Signed)
[place]  Patient:    [name], [name] Visit Number: [id number] MRN: [id number]          Service Type: MED Location: [id number] Attending Physician:  Ronaldo Miyamoto Dictated by:   [name], N.P. Admit Date:  06/11/2001 Disc. Date: 06/15/01   CC:         [name], M.D. [facility]   Discharge Summary  DATE OF BIRTH:  12/03/1945  CARDIOLOGIST:  [name], M.D. [facility].  REASON FOR ADMISSION:  Chest pain.  DISCHARGE DIAGNOSES: 1. Coronary artery disease, status post inferior myocardial infarction in    1998, followed by percutaneous coronary intervention of the right coronary    artery at that time.  Catheterization April 2000, was performed secondary    to a nuclear study that was positive for ischemia in inferobasilar    distribution.  This catheterization revealed normal left main, 40% distal    left anterior descending, 30% diagonal, 70-75% left circumflex, with a 50%    right coronary artery, ejection fraction 57%.  She was subsequently    enrolled in the reversal trial.  Chest pain evaluated this admission with a    coronary catheterization May 28, by [name] revealing normal left main,    20-30% mid and 30% distal left anterior descending disease, 30% diagonal    disease with a 70-80% stenosis seen in the obtuse marginal 1 of the    circumflex and 40% mid-vessel stenosis in the right coronary artery.  There    was an aneurysmal segment in the right coronary artery as well. 2. Hypothyroidism. 3. Hyperlipidemia. 4. Remote tobacco use. 5. Strong positive family history for early coronary artery disease.  HISTORY OF THE PRESENT ILLNESS:  This delightful 65 year old lady with medical history as outlined above presented to [place] for evaluation of chest pain.  At first the suspicion was low for this to be cardiac in origin since it was sharp substernal pain with radiation to the right arm and worsened with lying on  the right side.  But, given her history, it was deemed prudent to admit her.  HOSPITAL COURSE:  The patient was admitted to telemetry and maintained on her home medications.  Serial enzymes were drawn and CKs and MBs were negative in three sets.  Troponin I set #1 was 0.03, #2 0.07, #3 0.08.  The patient was taken to the cardiac catheterization lab, with the findings as described above.  She tolerated the procedure well.  The second stage of her catheterization involved placement of a stent to the circumflex and intravascular ultrasound of the LAD by [name].  An Asteroid stent was placed reducing the stenosis in the circumflex from 90 to less than 10.  The ultrasound revealed moderate plaquing of the LAD.  After the catheterization, the patient agreed to be enrolled in the [name] and her Zocor was discontinued.  The patient had some postprocedure trouble with lower abdominal pain and transient hypokalemia.  The hypokalemia was treated with replacement and the abdominal pain was treated with [name].  The patients pain gradually resolved and by day of discharge she was in good condition.  PHYSICAL EXAMINATION:  GENERAL:  The patient offered no complaints of chest pain, dyspnea or palpitations.  In no acute distress.  VITAL SIGNS:  Blood pressure 115/58, 76, 20, 94% on room air, 98.5.  Telemetry revealed a normal sinus rhythm.  CHEST:  Clear to auscultation bilaterally.  CARDIOVASCULAR:  Regular rate and rhythm, S1 and S2, no obvious murmur.  EXTREMITIES:  Without cyanosis, clubbing or edema.  Catheterization site in right femoral artery benign, slightly tender to palpation.  LABORATORY DATA:  At discharge hemogram as follows: WBC 5.9, hemoglobin 13.1, hematocrit 38.6, platelets 283.  Chemistry at discharge: Sodium 139, potassium 3.3; this will be treated with additional p.o. supplementation prior to discharge; chloride 103, CO2 30, glucose 126, BUN 9, creatinine 0.9.   Cardiac enzymes as noted above.  TSH slightly low at 0.029, this will be followed up by her primary care physician.  Hemoglobin A1c 6.0.  D-dimer negative at 0.31. Lipid profile as follows: Total cholesterol 178, triglycerides 188, HDL 36 and LDL 104.  Chest x-ray revealed no acute abnormality.  An x-ray taken at admission, however, revealed mild/ incipient congestive heart failure.  Discharge 12-lead EKG revealed a normal sinus rhythm without ischemic changes.  DISPOSITION:  The patient was discharged home on the following medications.  DISCHARGE MEDICATIONS: 1. Aspirin 325 mg one q.d. 2. Tenormin 25 mg one b.i.d. 3. Synthroid 200 mcg one q.d. 4. Plavix 75 mg one q.d. 5. Nitroglycerin 0.4 mg sublingual one SL q.69M. x3. 6. Rivastatin will be supplied by research.  ACTIVITY RESTRICTIONS:  The patient is advised to exercise by walking 30 minutes a day and no heavy lifting, driving, sex or tub baths for two days.  DIET RECOMMENDED:  Low-fat, low-cholesterol diet.  The patient is advised to eat bananas, oranges and greens to provide a source of dietary potassium.  WOUND CARE:  The patient agrees to call the office if her groin becomes hard or painful.  SPECIAL INSTRUCTIONS:  The patient is aware that she will start rivastatin 40 mg p.o. q.d. within 1-2 weeks.  The patient is advised to follow up her TSH with primary, [name].  FOLLOWUP:  Will be with [name] P.A. in one week and further followup to be scheduled at that time.  The patient agrees to call in the interim with any problems, questions or concerns or change or increase in symptoms. Dictated by:   [name], N.P. Attending Physician:  Ronaldo Miyamoto DD:  06/15/01 TD:  06/15/01 Job: [id number]

## 2010-06-04 NOTE — Assessment & Plan Note (Signed)
[facility]                            CARDIOLOGY OFFICE NOTE   NIKI, PAYMENT                       MRN:          [id number]  DATE:05/10/2006                            DOB:          10/23/1945    Ms. Kozlowski is in for followup.  She has been off Plavix for the past 3  days because she ran out.  She has had some shortness of breath with  exertion.  She has been fairly limited because of her back and is  scheduled by Dr. Venetia Maxon to have an L3-L5 laminectomy with fusion.  She is  also scheduled to have a colonoscopy in the next few days.  She has not  been having any ongoing chest pain.  Importantly, the patient has had  prior stents placed in both the circumflex and left anterior descending  artery.  She has had a previous percutaneous angioplasty of the right  coronary artery.  She underwent catheterization in November of 2007.  At  that time, she had tandem 40% lesions in the right coronary.  The LAD  demonstrates 70% area of narrowing after the diagonal.  The stent  appears to be patent with some mild healing at the stent site, 30%  distal narrowing.  The diagonal had 50% narrowing.  The circumflex only  had mild luminal irregularity.  There was hypokinesis of the inferior  wall with an ejection fraction of 55%.   PHYSICAL EXAMINATION:  On her examination today, the blood pressure was  104/74, the pulse is 75.  The lung fields are clear.  The cardiac rhythm is regular.   IMPRESSION:  1. Coronary artery disease, status post multivessel percutaneous      coronary intervention.  2. Hypercholesterolemia, on lipid lowering therapy.  3. Need for laminectomy.  4. Need for colonoscopy.   PLAN:  1. We will decrease her aspirin to 81 mg daily.  2. She will remain on Crestor and beta blocker throughout her      procedures.  3. She can stop her Plavix for 6 days prior to her colonoscopy since      she has already been off 3 days.  I told her to stay  off      throughout.  If the neurosurgical procedure is scheduled within the      next 3-4 weeks after that, I would remain off of Plavix.  She can      be off Plavix for all of these procedures, but I would recommend      maintaining low dose aspirin throughout.   ADDENDUM:  EKG reveals normal sinus rhythm with left axis deviation.     Arturo Morton. Riley Kill, MD, [facility]  Electronically Signed    TDS/MedQ  DD: 05/10/2006  DT: 05/10/2006  Job #: [id number]   cc:   Barbette Hair. Arlyce Dice, MD,FACG

## 2010-06-04 NOTE — Discharge Summary (Signed)
NAMEBEVERELY, SUEN                ACCOUNT NO.:  [id number]   MEDICAL RECORD NO.:  [id number]          PATIENT TYPE:  INP   LOCATION:  6527                         FACILITY:  [facility]   PHYSICIAN:  Charlies Constable, M.D. [facility] DATE OF BIRTH:  1945/06/24   DATE OF ADMISSION:  DATE OF DISCHARGE:  02/10/2004                                 DISCHARGE SUMMARY   PROCEDURE:  1.  Cardiac catheterization.  2.  Coronary arteriogram.  3.  Left ventriculogram.  4.  Percutaneous transluminal coronary angioplasty and stent to 1 vessel      (Costar trial).   HOSPITAL COURSE:  Ms. Coglianese is a 65 year old female with a history of  coronary artery disease.  She participated in the ________ trial in June of  2005.  She had an 80% diagonal that was treated medically.  She had chest  pain that was associated with diaphoresis and shortness of breath. She came  to the emergency room and was admitted for further evaluation and treatment.   Her cardiac enzymes were negative for MI but it was felt that she needed  cardiac catheterization to further define her anatomy. This was performed on  February 09, 2004.  The cardiac catheterization showed a 90% LAD that was  treated with PTCA and stent, reducing the stenosis to 0.  She was enrolled  in the Costar trial. The first diagonal had a 70% stenosis and the  circumflex had a 30% stenosis. The previously placed stent in the OM had  less than 10% in-stent restenosis.  The RCA had a 70% mid-stenosis and a 50-  70% mid-distal stenosis.  Her EF was 60% with normal wall motion.   Dr. Juanda Chance felt that the culprit lesion was the LAD lesion. He felt that the  other disease could be managed medically. She was placed on Plavix for a  year.   Post-cath, her EKG had no new changes.  Her groin was without oozing or  hematoma.  She is pending repeat CK-MB for the Costar trial, but is  tentatively considered stable for discharge on February 10, 2004 with  outpatient follow-up  arranged.   DISCHARGE DIAGNOSES:  1.  Unstable anginal pain, status post percutaneous transluminal coronary      angioplasty and stent per the Costar trial.  2.  Leukocytosis with a white count on admission of 17,000, afebrile with      normal urinalysis and chest x-ray, no acute disease.  3.  Hyperglycemia. Hemoglobin A1c pending at the time of dictation.  4.  Hypothyroidism, Synthroid dose recently decreased with a TSH of 0.201      this admission.   DISCHARGE INSTRUCTIONS:  1.  Her activity level was to include no driving for 3 days and no strenuous      activity for 1 week.  She is to stick to low fat diet. She is to call      the office for problems with the cath site.  She will be placed on a      diabetic diet and given outpatient diabetes referral p.r.n.  2.  She  is to follow-up with Dr. Debby Bud as needed, and has an appointment      with Dr. Riley Kill on January 30 at 4:30 p.m.   DISCHARGE MEDICATIONS:  1.  Crestor 10 mg daily.  2.  Nitroglycerin sublingual p.r.n.  3.  Plavix 75 mg daily.  4.  Atenolol 25 mg daily.  5.  Synthroid 150 mcg daily.  6.  Aspirin 325 mg daily.      RB/MEDQ  D:  02/09/2004  T:  02/09/2004  Job:  [id number]   cc:   Arturo Morton. Riley Kill, M.D. [facility]   Rosalyn Gess. Norins, M.D. [facility]

## 2010-06-04 NOTE — H&P (Signed)
[name], [name]                ACCOUNT NO.:  [id number]   MEDICAL RECORD NO.:  [id number]          PATIENT TYPE:  OIB   LOCATION:  2899                         FACILITY:  [facility]   PHYSICIAN:  Arturo Morton. Riley Kill, MD, FACCDATE OF BIRTH:  26-May-1945   DATE OF ADMISSION:  12/01/2005  DATE OF DISCHARGE:                              HISTORY & PHYSICAL   CHIEF COMPLAINT:  Exertional shortness of breath and mild chest  discomfort.   HISTORY OF PRESENT ILLNESS:  [name] is a 65 year old well known to  me.  She has had increasing back problems.  Surgery has been recommended  and discussed as a possibility.  She was sent for preoperative  evaluation.  She had had previously placed stents with prior urgent  percutaneous intervention of the right coronary artery and treatment in  both the LAD and the circumflex.  Her most recent treatment was in the  distribution of the left anterior descending artery with CoStar study  stent.  She underwent an adenosine Myoview study.  The Myoview study  demonstrated an ejection fraction of 48% with mild anterior hypokinesis  and there was a suggestion of prior anterior infarct with mild peri-  infarct ischemia.  Overall, this is new.  When she last underwent  cardiac catheterization in January 2006, the patient had a CoStar study  stent placed in the left anterior descending artery with a 3.5 x 28  platform.  The stent was post dilated.  At that time, the right coronary  artery demonstrated a focal area of 50-70% disease distally and a 70%  lesion proximally in the right.  The circumflex was previously stented  and there is less than 10% narrowing in this location.  Overall left  ventricular function was preserved with good wall motion and an ejection  fraction of 60%.  She has not had severe limiting symptoms but she  clearly has had some symptoms, although her overall situation has been  limited, to some extent, by her inability to get around.  She is  now  brought back in for repeat catheterization.   PAST MEDICAL HISTORY:  1. Coronary artery disease, prior inferior wall infarction treated      with angioplasty and multivessel disease treated with stenting.  2. Hypothyroidism, treated.  3. History of smoking.   ALLERGIES:  CODEINE and TEQUIN.   MEDICATIONS:  1. Synthroid 150 mcg daily.  2. Plavix 75 mg daily.  3. Aspirin 325 mg daily.  4. Crestor 20 mg daily.  5. Atenolol 25 mg p.o. b.i.d.  6. Mobic 15 mg daily.   FAMILY HISTORY:  __________.   SOCIAL HISTORY:  The patient is single.  She has children.  She works at  [employer].  She smokes occasionally.   REVIEW OF SYSTEMS:  Is remarkable for some sinus symptoms.   PHYSICAL EXAMINATION:  Alert, oriented female in no acute distress.  Temperature 97.6, heart rate 92, respirations 20, blood pressure 126/82.  Height 5 feet 6 inches, weight 235 pounds.  No jugular venous  distension.  Carotids are normal upstroke without bruits.  HEENT EXAM:  Unremarkable.  LUNG FIELDS:  Clear to auscultation and percussion.  PMI nondisplaced.  Normal first and second heart sound.  ABDOMEN: Soft.  No hepatosplenomegaly or masses noted.  EXTREMITIES: Intact without edema.  Pulses full.   IMPRESSION:  1. Coronary artery disease status post percutaneous coronary      intervention, left anterior descending artery, with suggestion of      anterior ischemia and perfusion defect.  2. Prior stenting of the circumflex coronary artery.  3. Prior percutaneous intervention of the right coronary artery.  4. Hypothyroidism.  5. Hypercholesterolemia.  6. Hypertension.  7. History of tobacco use.   RECOMMENDATIONS:  The patient needs surgery.  She has had multivessel  percutaneous intervention in the past, the current radionuclide imaging  study suggests reduced ejection fraction, decreased wall motion in the  anterior segment, and mild peri-infarct ischemia.  She is mildly  symptomatic,  although it is difficult to tell whether this is ischemia  are deconditioning.  She does need surgery and patency of the LAD stent  is an important clinical issue at the present time.  Cardiac  catheterization has been recommended.  Risks, benefits, and alternatives  have been discussed with the patient and she has consented to proceed.      Arturo Morton. Riley Kill, MD, [facility]  Electronically Signed     TDS/MEDQ  D:  12/01/2005  T:  12/01/2005  Job:  [id number]

## 2010-06-04 NOTE — Assessment & Plan Note (Signed)
[facility]                              CARDIOLOGY OFFICE NOTE   [name], [name]                       MRN:          [id number]  DATE:07/19/2005                            DOB:          05/19/1945    [name] is in for a followup visit.  Overall, from a cardiac standpoint,  she is doing reasonably well.  She has had a lot of sinus congestion, and  today we ask her to see Dr. Jonny Ruiz for followup on this.  She has seen Dr.  Venetia Maxon, and surgery has been suggested; however, the patient is reconsidering  this, and is not sure she wants to go ahead with the surgery.  The patient  last underwent evaluation in January of 2006.  She had had a previous  circumflex stent, and there was less than 10% narrowing.  In 2006, she had a  90% stenosis in the proximal left anterior descending artery with a 70%  stenosis in the second diagonal branch, 70% proximal to mid and 50-70% mid  distal stenosis in the right coronary artery with normal left ventricular  function.  She had a COSTAR study stent placed in the left anterior  descending artery in January of 2006.  She currently is without ongoing  symptoms.  Her last LDL cholesterol in March of this year on Crestor 20  revealed an LDL of 72, although her LDL was low at 30.  Her liver function  studies were unremarkable.   CURRENT MEDICATIONS:  1. Aspirin 325 mg daily.  2. Synthroid 150 mcg daily.  3. Plavix 75 mg daily.  4. Crestor 20 mg daily.  5. Atenolol 25 mg p.o. b.i.d.   PHYSICAL EXAMINATION:  GENERAL:  She is an alert, oriented female.  VITAL SIGNS:  Blood pressure is 108/70, pulse 79.  LUNGS:  The lung fields are clear.  CARDIAC:  The cardiac rhythm is regular.   ELECTROCARDIOGRAM:  The electrocardiogram demonstrates normal sinus rhythm  and is within normal limits.   IMPRESSION:  1. Coronary artery disease status post percutaneous coronary intervention      of both the circumflex and left anterior  descending artery, as well as      treatment of the right coronary artery for acute myocardial infarction.  2. Hypothyroidism.  3. Hypercholesterolemia.  4. Hypertension.  5. History of tobacco use.   RECOMMENDATIONS:  1. It would be reasonable for the patient to drop her aspirin to 81 mg      daily.  2. She would be a candidate for surgery, albeit increased risk because of      her underlying coronary artery disease, but at the present time she is      without ongoing symptoms.  I would      be reluctant to stop all of her antiplatelet drugs, given her drug-      eluding stent.  3. Sinus issues.  Followup with Dr. Efrain Sella.  Arturo Morton. Riley Kill, MD, [facility]    TDS/MedQ  DD:  08/29/2005  DT:  08/29/2005  Job #:  [id number]

## 2010-06-04 NOTE — Assessment & Plan Note (Signed)
[place] [facility]                         GASTROENTEROLOGY OFFICE NOTE   [name], [name]                       MRN:          [id number]  DATE:01/02/2006                            DOB:          April 04, 1945    REASON FOR CONSULTATION:  Colorectal cancer screening.   [name] is a pleasant 65 year old [place] American female referred  through the courtesy of Dr. Jonny Ruiz for screening colonoscopy. [name]  was scheduled 3 years ago for colonoscopy, but failed to follow through  because of financial difficulties. Her only GI complaint is mild  constipation that has been stable. There is no history of melena or  hematochezia or abdominal pain.   PAST MEDICAL HISTORY:  Is pertinent for:  1. Coronary artery disease. She is status post angioplasty.  2. She is status post thyroidectomy.   FAMILY HISTORY:  Is pertinent for both parents with heart disease.   MEDICATIONS:  Include Synthroid, Plavix, Crestor and atenolol, aspirin  and Mobic.   SHE IS ALLERGIC TO CODEINE AND TEQUIN.   She neither smokes nor drinks. 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   REVIEW OF SYSTEMS:  Was reviewed and is negative.   PHYSICAL EXAMINATION:  Pulse is 84, blood pressure 110/72, weight 245.  HEENT: EOMI. PERRLA. Sclerae are anicteric.  Conjunctivae are pink.  NECK:  Supple without thyromegaly, adenopathy or carotid bruits.  CHEST:  Clear to auscultation and percussion without adventitious  sounds.  CARDIAC:  Regular rhythm; normal S1 S2.  There are no murmurs, gallops  or rubs.  ABDOMEN:  Bowel sounds are normoactive.  Abdomen is soft, non-tender and  non-distended.  There are no abdominal masses, tenderness, splenic  enlargement or hepatomegaly.  EXTREMITIES:  Full range of motion.  No cyanosis, clubbing or edema.  RECTAL:  Deferred.   IMPRESSION:  1. Mild functional constipation.  2. Coronary artery disease, stable.   RECOMMENDATION:  Screening colonoscopy.     Barbette Hair. Arlyce Dice, MD,FACG  Electronically Signed    RDK/MedQ  DD: 01/02/2006  DT: 01/02/2006  Job #: [id number]   cc:   Arturo Morton. Riley Kill, MD, [facility]

## 2010-06-04 NOTE — Assessment & Plan Note (Signed)
[facility]                              CARDIOLOGY OFFICE NOTE   [name], [name]                       MRN:          [id number]  DATE:11/28/2005                            DOB:          1945-09-25    [name] is in for followup.  She has had problems with her low back, and  apparently has lumbar compression.  She has had numbness in her left foot  and recently was in the emergency room because of this.  Dr. Venetia Maxon has  recommended a 3-level laminectomy.  The patient has had multiple drug-  eluting stents placed in the past.  Because of this, we elected to go ahead  and do a Cardiolite study to reassess the situation.  There is clearly some  decreased uptake in the anterior wall, which is less prominent on the late  studies, although it is not profound.  In conjunction with that, she does  get fairly winded walking from her work place into her office.  She says  this is perhaps more noticeable and she thinks there is potential that here  is something wrong.   MEDICATIONS:  1. Synthroid 150 mcg daily.  2. Plavix 75 mg daily.  3. Crestor 20 mg daily.  4. Atenolol 25 mg p.o. b.i.d.  5. Aspirin 325 mg daily.  6. Mobic 15 mg daily.   PHYSICAL EXAMINATION:  She is an alert and oriented female in no acute  distress.  Blood pressure is 128/86, pulse today is 105.  LUNGS:  Fields are clear.  CARDIAC:  Rhythm is regular.   At the present time, she likely will need surgery.  She is getting rehab at  the orthopedic clinic currently.  Her most recent EKG is unremarkable.  Her  Cardiolite suggests some anterior ischemia, and she has previously had a  COSTAR study stent placed in the LAD.  I think that restudy would probably  be important at this point to reassess the situation and to make it clear  what she can and cannot do.  It will also potentially affect her  antiplatelet therapy.  She is agreeable to this and we will make  arrangements for  Thursday.     [name]. Brandi Kill, MD, [facility]  Electronically Signed    TDS/MedQ  DD: 11/28/2005  DT: 11/28/2005  Job #: [id number]

## 2010-06-04 NOTE — Assessment & Plan Note (Signed)
[facility]                         GASTROENTEROLOGY OFFICE NOTE   [name], [name]                       MRN:          [id number]  DATE:05/03/2006                            DOB:          Apr 09, 1945    [name] has returned to scheduled colonoscopy.  She was seen in  December for this purpose, but apparently test was not done.  Her only  GI complaint is mild constipation.   MEDICATIONS:  Include Synthroid, Plavix, Crestor, atenolol, aspirin, and  Mobic.   ALLERGIES:  CODEINE.   PHYSICAL EXAMINATION:  VITAL SIGNS:  Pulse 60, blood pressure 112/66,  weight 248.   IMPRESSION:  1. Coronary artery disease, stable.  2. Functional constipation.   RECOMMENDATIONS:  Colonoscopy.   I will hold her Plavix prior to her exam if this is permissible with Dr.  Riley Kill.     Barbette Hair. Arlyce Dice, MD,FACG  Electronically Signed    RDK/MedQ  DD: 05/03/2006  DT: 05/03/2006  Job #: [id number]   cc:   Arturo Morton. Riley Kill, MD, [facility]

## 2010-06-04 NOTE — Assessment & Plan Note (Signed)
[facility]                              CARDIOLOGY OFFICE NOTE   [name], [name]                       MRN:          [id number]  DATE:11/10/2005                            DOB:          Dec 15, 1945    [name] is in for followup.  In general, she has been fairly stable.  She  has had some intermittent chest discomfort, but it has not been frequent.  Importantly, the patient had a drug-eluting stent placed in the circumflex  previously, and in the left anterior descending artery.  The last procedure  was done in January of 2006.  However, she has not been active, and it has  been difficulty to assess her overall exercise tolerance.  In addition, she  has not had an interval myocardial perfusion study.  She has not had any  unstable chest pain.   PHYSICAL EXAMINATION:  GENERAL:  She is alert and oriented.  VITAL SIGNS:  Blood pressure is 115/70, pulse 91.  LUNGS:  Fields are clear.  CARDIAC:  Regular.   To summarize, [name] is a woman who has had a 3-vessel PCI in the past.  It has been more than 365 days, and she has a drug-eluting stent.  She is  symptomatically stable and probably stable for surgery.  I have given her  multiple stents and a lack of evaluation recently.  I think we will go ahead  and get a Myocardial perfusion study, even independent of the preoperative  evaluation.  My preference would be that she continue on aspirin, and she  could stop the Plavix up to 5 days prior to surgery.  She should continue  her beta blockade throughout surgery.     Arturo Morton. Riley Kill, MD, [facility]    TDS/MedQ  DD: 11/10/2005  DT: 11/11/2005  Job #: [id number]

## 2010-06-04 NOTE — Cardiovascular Report (Signed)
[name], [name]                ACCOUNT NO.:  [id number]   MEDICAL RECORD NO.:  [id number]          PATIENT TYPE:  INP   LOCATION:  4703                         FACILITY:  [facility]   PHYSICIAN:  Charlies Constable, M.D. [facility] DATE OF BIRTH:  06-Oct-1945   DATE OF PROCEDURE:  02/09/2004  DATE OF DISCHARGE:                              CARDIAC CATHETERIZATION   CLINICAL HISTORY:  [name] is 65 years old and has had a previous stent  placed in the circumflex artery in 2003 and was part of the ASTEROID trial  with Crestor at that time.  She had a followup catheterization in 2005 which  showed nonobstructive disease.  She was admitted three days ago with  prolonged chest pain consistent with unstable angina and scheduled for  catheterization today.   PROCEDURE:  The procedure was performed via the right femoral artery using  artery sheath and 6 French preformed coronary catheters.  A femoral arterial  puncture was performed and Omnipaque contrast was used.  After placing the  diagnostic study, I made an incision through the intervention on the left  anterior descending artery.   The patient was given Angiomax bolus and infusion and was given 300 mg of  Plavix at the end of the procedure.  She required heavy sedation because she  was quite nervous and she received a total of 5 mg of Versed and 200 mg of  fentanyl throughout the procedure.   The patient was enrolled in the COSTAR trial and was randomized to the  COSTAR study stent.  This was either a TAXUS stent or a COSTAR stent.  We  used a 3.5 6 [place] guiding catheter with side holes and a Luque wire.  We  crossed the lesion in the proximal LAD with the wire without difficulty.  We  predilated with a 3.0 x 20 mm Quantum Maverick balloon, performing two  inflations at 10 atmospheres for 30 seconds.  We then deployed a 3.5 x 28 mm  study stent.  We placed the proximal edge of the stent just after a second  diagonal branch.  We deployed this  with one inflation of 12 atmospheres for  30 seconds.  We then post dilated with a 2.75 x 20 mm Quantum Maverick,  performing two inflations up to 18 atmospheres for 30 seconds.  A repeat  diagnostic study was then performed from the guiding catheter.  The patient  tolerated the procedure well and left the laboratory in satisfactory  condition.   RESULTS:  Left main coronary artery:  The left main coronary artery was free  of significant disease.   Left anterior descending artery:  The left anterior descending artery gave  rise to two diagonal branches, the septal perforator and then a third small  diagonal branch.  There was 90% stenosis in the proximal LAD just after the  second diagonal branch and crossing the septal perforator.  There was 70%  narrowing in the second diagonal branch.   Circumflex artery:  The circumflex artery gave rise to a small ramus branch,  a large marginal branch and a posterolateral  branch.  There was 30%  narrowing in the proximal circumflex artery.  There was less than 10%  narrowing in the stent which began in the AV circumflex and extended into  the marginal branch crossing the AV circumflex.   Right coronary artery:  The right coronary artery was moderately diffusely  diseased with a long area of these in the proximal to mid vessel with focal  narrowing of 70%.  There was also a long area of disease in the mid to  distal vessel with focal narrowing of 50-70%.   Left ventriculogram:  The left ventriculogram performed in the RAO  projection showed good wall motion with no areas of hypokinesis.  The  ejection fraction was 60%.   Following stenting of the lesion in the proximal LAD, the stenosis improved  from 90% to 0%.   The aortic pressure was 124/71 with a mean of 93 and the left ventricular  pressure was 124/17.   CONCLUSION:  1.  Coronary artery disease, status post previous stenting of the circumflex      artery in 2003 with less than 10%  narrowing at the stent site and the      circumflex artery, 90% stenosis in the proximal left anterior descending      artery with 70% stenosis in the second diagonal branch, 70% proximal to      mid and 50-70% mid to distal stenosis in the right coronary artery and      normal LV function.  2.  Successful stenting of the lesion in the proximal LAD with a COSTAR drug-      eluding study stent with improvement in central  narrowing from 90% to      0%.   DISPOSITION:  The patient was returned to the __________  unit for further  observation.      BB/MEDQ  D:  02/09/2004  T:  02/09/2004  Job:  [id number]   cc:   Rosalyn Gess. Norins, M.D. [facility]   Maisie Fus D. Riley Kill, M.D. [facility]   Cardiopulmonary Laboratory

## 2010-06-04 NOTE — Cardiovascular Report (Signed)
[place]. [facility]  Patient:    [name], [name] Visit Number: [id number] MRN: [id number]          Service Type: MED Location: [phone] Attending Physician:  Ronaldo Miyamoto Dictated by:   Noralyn Pick Eden Emms, M.D. [facility] Proc. Date: 06/13/01 Admit Date:  06/11/2001   CC:         Arturo Morton. Riley Kill, M.D. [facility]   Cardiac Catheterization  PROCEDURE PERFORMED: Coronary arteriography.  INDICATIONS: Recurrent chest pain, Cardiolite study suggesting anterolateral wall ischemia.  Standard catheterization was done from the right femoral artery.  CORONARY ARTERIOGRAPHY: Left main coronary artery had 20% discrete lesion.  Left anterior descending artery had 20-30% multiple discrete lesions in the mid and distal vessel.  The first diagonal branch was small with 30% discrete lesion.  The circumflex coronary artery had a large first obtuse marginal branch with a somewhat hazy 70-80% tubular lesion proximally.  Right coronary artery was small. There was an aneurysmal segment in the mid body with a 40% discrete lesion.  RIGHT ANTERIOR OBLIQUE VENTRICULOGRAPHY: RAO ventriculography revealed mild inferobasal wall hypokinesis. Ejection fraction is in the 55% range. There was no gradient across the aortic valve and no MR.  IMPRESSION: The films will be reviewed with Dr. Juanda Chance. I suspect we will proceed with angioplasty to the OM either today or later tomorrow. Dictated by:   Noralyn Pick Eden Emms, M.D. [facility] Attending Physician:  Ronaldo Miyamoto DD:  06/13/01 TD:  06/14/01 Job: [id number]

## 2010-06-04 NOTE — Cardiovascular Report (Signed)
NAMEDRIANA, DAZEY                ACCOUNT NO.:  [id number]   MEDICAL RECORD NO.:  [id number]          PATIENT TYPE:  OIB   LOCATION:  2807                         FACILITY:  [facility]   PHYSICIAN:  Arturo Morton. Riley Kill, MD, FACCDATE OF BIRTH:  1946-01-17   DATE OF PROCEDURE:  12/01/2005  DATE OF DISCHARGE:                            CARDIAC CATHETERIZATION   INDICATIONS:  Ms. Sher is very nice lady, well-known to me.  She has  previously undergone percutaneous intervention to the right coronary  artery for an acute myocardial infarction.  Subsequent to this, she has  had stents placed in both the LAD and circumflex.  Both have been drug-  eluting platforms.  She remains on Plavix.  Current study is done to  assess coronary anatomy.  The patient needs to have back surgery.  She  had a radionuclide imaging study suggesting anterior ischemia and  infarct with a reduced ejection fraction.  She was agreeable to proceed.   PROCEDURE:  1. Left heart catheterization.  2. Selective coronary arteriography.  3. Selective left ventriculography.   DESCRIPTION OF PROCEDURE:  The patient was brought to the  catheterization laboratory and prepped and draped in usual fashion.  Through an anterior puncture, the right femoral artery was easily  entered.  A 5-French sheath was placed.  Views of the left and right  coronary arteries were then obtained in multiple angiographic  projections.  Central aortic and left ventricular pressures were  measured with a pigtail.  Ventriculography was done in the RAO  projection.  She tolerated the procedure without major complication.  She did have chest pain during contrast injection.  She was taken to the  holding area in satisfactory clinical condition.   HEMODYNAMIC DATA.:  1. Central aortic pressure 162/91, mean 119.  2. Left atrial pressure 135/13.  3. No gradient or pullback across the aortic valve.   ANGIOGRAPHIC DATA:  1. The right coronary artery is the  previous infarct artery.  There      are tandem lesions of about 40% in the proximal mid vessel and 40%      near the acute margin.  Distal vessel consists of a posterior      descending and posterolateral branch.  These are without critical      narrowing.  2. Left main is free of critical disease.  3. The LAD demonstrates about a 20% area of narrowing after the      diagonal takeoff.  The stented site appears to be patent with some      mild healing at the site of the stent.  Distal to this, there is      30% narrowing.  The distal vessel has excellent flow into the      distal vessel.  The diagonal is somewhat diffusely diseased with      tandem areas of about 50%.  However, it does not appear to be      critical.  4. The circumflex provides a large marginal branch and an AV      circumflex, all of which have mild luminal irregularity and  some      distal tapering, but no critical stenoses.  5. Ventriculography in the RAO projection reveals overall preserved      systolic function with improved inferior wall motion.  There is not      a definite anterior wall motion abnormality.  Inferior wall was      slightly hypokinetic, but the ejection fraction appears to be 55%      or better.   CONCLUSION:  1. Preserved overall left ventricular function with mild inferior      hypokinesis.  2. Continued patency of the initial infarct-related artery in the      right coronary territory without critical renarrowing  3. Continued patency of both the LAD and circumflex stents without      significant focal renarrowing.   DISPOSITION:  The patient should be a candidate for surgery if  necessary.  She should remain on Plavix up until that time of surgery  and remain on aspirin through the surgery.      Arturo Morton. Riley Kill, MD, [facility]  Electronically Signed     TDS/MEDQ  D:  12/01/2005  T:  12/01/2005  Job:  [id number]   cc:   Arturo Morton. Riley Kill, MD, [facility]  CV Laboratory

## 2010-06-04 NOTE — Telephone Encounter (Signed)

## 2010-06-04 NOTE — H&P (Signed)
[place]. [facility]  Patient:    [name], [name] Visit Number: [id number] MRN: [id number]          Service Type: MED Location: [phone] Attending Physician:  Corlis Leak. Dictated by:   Madolyn Frieze. Jens Som, M.D. [facility] Admit Date:  06/11/2001                           History and Physical  CHIEF COMPLAINT: Ms. Kimble is a pleasant 65 year old patient of Dr. Elmarie Mainland, with a past medical history of coronary artery disease, status post inferior myocardial infarction in 1998, hypothyroidism, hyperlipidemia, who presents for evaluation of chest pain.  HISTORY OF PRESENT ILLNESS: The patients prior cardiac history dates back to 1998 when she had an inferior myocardial infarction.  She had PCI of the right coronary artery at that time.  Her most recent catheterization was performed in April 2000 secondary to a nuclear study that showed mild inferobasilar ischemia.  This showed a normal left main, a 40% distal LAD, a 30% diagonal, a 70-75% left circumflex, and a 50% right coronary artery.  Her ejection fraction was 57%, and she was treated medically.  She was enrolled in reversal.  Since that time she has had no exertional chest pain or dyspnea. This morning she awoke with substernal chest pain that was just to the right of the sternum.  It radiated to the right arm.  She described the pain as "sharp" and increased with lying on her right side.  This was not like the pain with her myocardial infarction.  There was associated diaphoresis but no nausea, vomiting, or shortness of breath.  The pain was not pleuritic and not related to food.  She came to the emergency room and is now pain-free.  Of note, she denies any recent travel and there has been no leg trauma.  PRESENT MEDICATIONS:  1. Synthroid 200 mcg p.o. q.d.  2. Atenolol 25 mg p.o. b.i.d.  3. Aspirin q.d.  ALLERGIES: No known drug allergies.  SOCIAL HISTORY: She has a remote history of  tobacco use but none in the past year.  She occasionally consumes alcohol.  FAMILY HISTORY: Strongly positive for coronary artery disease in both her mother and father.  PAST MEDICAL HISTORY:  1. She does have a history of mild hyperlipidemia but there is no diabetes     mellitus or hypertension by report.  2. She has a history of thyroidectomy and is now hypothyroid.  3. She is status post hysterectomy as well as:     a. Appendectomy.     b. Tonsillectomy.  REVIEW OF SYSTEMS: She does presently have a headache from her nitroglycerin. There is no fever or chills.  There is no productive cough.  There is no hemoptysis noted.  There is no dysphagia, odynophagia, melena, or hematochezia.  There is no dysuria or hematuria.  There is no rash, or seizure activity.  There is no orthopnea, PND, or pedal edema.  There is no claudication noted.  The remaining systems are negative.  PHYSICAL EXAMINATION:  VITAL SIGNS: Blood pressure today 164/95 and pulse on arrival was 116.  She is afebrile.  Saturation 99% on room air.  GENERAL: She is well-developed, well-nourished, and in no acute distress.  SKIN: Warm and dry.  HEENT: Unremarkable, with normal eyelids.  NECK: Supple with normal upstroke bilaterally.  There are no bruits noted. There is no jugular venous distention and no  thyromegaly noted.  She is status post thyroidectomy and the scar is evident.  CHEST: Clear to auscultation with normal expansion.  Of note, she does have tenderness over the right chest upon palpation.  CARDIOVASCULAR: Regular rate and rhythm with normal S1 and S2.  There are no murmurs, rubs, or gallops noted.  ABDOMEN: Nontender, nondistended.  Positive bowel sounds.  No hepatosplenomegaly.  No masses appreciated.  There is no abdominal bruit. EXTREMITIES: She has 2+ femoral pulses bilaterally and there are no bruits noted.  Her extremities show no edema.  I can palpate no cords.  She has 2+ dorsalis pedis  pulses bilaterally.  NEUROLOGIC: Grossly intact.  LABORATORY DATA: Her electrocardiogram shows normal sinus rhythm at a rate of 96.  The axis is normal.  There are no ST changes.  Her BUN is 14 and her creatinine is 1.  Her glucose is 149.  Her sodium is 138 with potassium of 3.6.  Her chloride is 104 with a CO2 of 33.  Her WBC is 6.4 with a hemoglobin of 14.1 and hematocrit of 41.9.  Her platelet count is 325,000.  Her initial enzymes are negative.  DIAGNOSES:  1. Atypical chest pain.  2. History of coronary artery disease.  3. History of mild hyperlipidemia.  4. Hypothyroid.  5. Elevated glucose.  PLAN:  1. Ms. Andy presents with chest pain.  Her symptoms are most consistent with     musculoskeletal pain as they do worsen with palpation and lying on her     left side.  They are unlike the symptoms associated with her myocardial     infarction.  Her initial enzymes are negative and her ECG shows no acute     ST changes.  We will continue to cycle enzymes and repeat her     electrocardiogram in the morning.  If they are negative then we will plan     to risk stratify with an adenosine Cardiolite.  If this shows no ischemia     then I think we can continue with medical therapy.  2. We will continue with her aspirin and her Atenolol and I will add Zocor 40     mg p.o. q.d. given her history of coronary disease.  3. We will check a fasting cholesterol panel as well as liver functions in     six weeks.  4. I will check a D-dimer, although I think the likelihood of pulmonary     embolus is small.  5. Finally, her glucose is elevated and we will check a hemoglobin A1C to     screen for diabetes mellitus. Dictated by:   Madolyn Frieze. Jens Som, M.D. [facility] Attending Physician:  Corlis Leak DD:  06/11/01 TD:  06/11/01 Job: [id number]

## 2010-06-10 DIAGNOSIS — G4710 Hypersomnia, unspecified: Secondary | ICD-10-CM

## 2010-06-10 DIAGNOSIS — G4730 Sleep apnea, unspecified: Secondary | ICD-10-CM

## 2010-06-10 NOTE — Procedures (Signed)
NAMECOLBY, [name]                ACCOUNT NO.:  [id number]  MEDICAL RECORD NO.:  [id number]          PATIENT TYPE:  OUT  LOCATION:  SLEEP CENTER                 [facility]ACILITY[facility]  PHYSICIAN:  Barbaraann Share, MD,FCCPDATE OF BIRTH:  1945/04/29  DATE OF STUDY:  05/23/2010                           NOCTURNAL POLYSOMNOGRAM  REFERRING PHYSICIAN:  THOMAS STUCKEY  INDICATION FOR STUDY:  Hypersomnia with sleep apnea.  EPWORTH SCORE:  5.  SLEEP ARCHITECTURE:  The patient had total sleep time of 254 minutes with very little slow wave sleep and only 24 minutes of REM.  Sleep onset latency was prolonged at 72 minutes, and REM onset was prolonged at 355 minutes.  Sleep efficiency was poor at 56%.  RESPIRATORY DATA:  The patient was found to have no obstructive apneas and only 3 obstructive hypopneas, giving her an apnea/hypopnea index of only 0.7 events per hour.  No snoring was noted during the study.  The patient did not meet split night criteria secondary to the small numbers of events.  It should be noted that she did not have supine sleep during the night.  OXYGEN DATA:  O2 desaturation as low as 90% with her rare events and also spontaneously.  CARDIAC DATA:  No clinically significant arrhythmias were seen.  MOVEMENT/PARASOMNIA:  The patient was found to have 143 periodic leg movements with 8 per hour resulting in arousal or awakening.  There were no abnormal behavior seen.  IMPRESSION/RECOMMENDATIONS: 1. Small numbers of obstructive events which do not meet the AHI     criteria for the obstructive sleep apnea syndrome. 2. Large numbers of periodic leg movements with 8 per hour resulting     in arousal or awakening.  Clinical     correlation is suggested to see if the patient has symptoms     consistent with a primary movement disorder of sleep.     Barbaraann Share, MD,FCCP Diplomate, American Board of Sleep Medicine Electronically Signed    [facility]/MEDQ  D:  06/10/2010  07:46:10  T:  06/10/2010 19:12:39  Job:  [id number]

## 2010-06-15 ENCOUNTER — Telehealth: Payer: Self-pay | Admitting: Cardiology

## 2010-06-15 NOTE — Telephone Encounter (Signed)
Pt wants lauren to call her. Pt wants only to talk to lauren re her effient.

## 2010-06-15 NOTE — Telephone Encounter (Signed)
I spoke with the pt and placed samples of Effient at the front desk #[phone].  The pt has prescription drug coverage so she does not qualify for the [employer] assistance program.  I did put information about [employer] in with the pt's samples.  I also placed a Rx for Effient and a co-pay card in bag for the pt to take to her pharmacy.

## 2010-06-17 NOTE — Cardiovascular Report (Signed)
NAMEJOVEE, DETTINGER                ACCOUNT NO.:  [id number]  MEDICAL RECORD NO.:  [id number]           PATIENT TYPE:  LOCATION:                                 FACILITY:  PHYSICIAN:  Arturo Morton. Riley Kill, MD, FACCDATE OF BIRTH:  March 27, 1945  DATE OF PROCEDURE:  04/30/2010 DATE OF DISCHARGE:                           CARDIAC CATHETERIZATION   INDICATIONS:  [name] is a very delightful 65 year old, well known to me.  She has previously undergone percutaneous intervention of both the left anterior descending and circumflex coronary arteries.  She has most recently undergone catheterization in November 2011.  At that time, there was a mild plaquing beyond the previously placed circumflex stent, modest RCA disease, and a known 70% irregular stenosis of a modest-sized diagonal.  The large LAD is without critical narrowing.  Unfortunately, the patient has stopped her Crestor because of cost and also stopped Plavix.  She had a severe episode of chest pain last week, in which she was admitted.  She underwent enzyme testing which was negative.  She has continued to have symptoms, which have accelerated in the past few days. We had her undergo stress nuclear imaging, and this demonstrated a large redistribution defect in the posterolateral segment.  As a result, she was admitted by Dr. Excell Seltzer and set up for cardiac catheterization today. Risks, benefits, and alternatives were discussed with the patient and she consented to proceed.  PROCEDURES: 1. Left heart catheterization. 2. Selective coronary arteriography. 3. Selective left ventriculography. 4. Percutaneous stenting of the circumflex coronary artery.  DESCRIPTION OF THE PROCEDURE:  The patient was brought to the cath lab, prepped and draped in usual fashion.  After careful analysis, we elected to use of the left femoral region, a Smart needle was used to enter with an anterior puncture.  A 5-French sheath was placed.  Views of the  left and right coronary arteries were obtained.  Central aortic and left ventricular pressures were measured with pigtail.  The patient does have a distal aortic aneurysm, and the wire, stent, and coil and therefore a Versacore wire was used to gain access.  Following this, we reviewed the films.  Compared to the previous study, there is now total occlusion but this is distal to the stent, not within the stent itself.  This is in the circumflex territory.  The distal vessel fills by retrograde collaterals.  We carefully looked at the old films.  There is also some progression in the right coronary artery from the previous study of 4 months earlier.  Given the symptoms and the lack of critical LAD disease, we elected to attempt to cross the circumflex vessel and I discussed this with the patient.  A JL-3.5 guiding catheter was utilized, bivalirudin was given according to protocol.  Prasugrel was administered as a 60 mg oral bolus, she had no contraindications to its use.  Following this, we used a traverse wire and were able to gain access to the distal vessel.  A 2.25 and 2.5 mm balloons were used to predilate and open this area up.  Based on the previous size demonstrated at catheterization, I elected  to use a 2.75 x 18 Integrity stent.  We measured the length, and the size was felt to be compatible with the previous intervention which had a 3.5 mm proximally and appeared to be about 3 mm distally on the previous angiographic studies. This stent was deployed at 14 atmospheres and postdilated with a 3.25-mm balloon to about 6-8 atmospheres throughout.  There was some tapered narrowing distally, just prior to the bifurcation but after multiple views and waiting for a while, we went back in, took an additional view and this appeared to be widely patent.  After nitroglycerin, there was about 50% narrowing in the AV circumflex after the bifurcation.  At this point, we decided to stop the  patient's tolerance for these procedures is not very good.  She had some chest pain throughout, also experienced headache, restlessness.  Multiple doses of Versed and fentanyl were given during the course of the procedure.  ACT was checked and was appropriate for the procedure itself.  There were no major complications other than continued chest pain.  There were no electrocardiographic abnormalities, and the vessel was widely patent as she complained of chest pain.  HEMODYNAMIC DATA: 1. The central aortic pressure 137/81, mean 104. 2. Left ventricular pressure 137/90. 3. No gradient or pullback across the aortic valve.  ANGIOGRAPHIC DATA: 1. Ventriculography done in the RAO projection reveals a small wall     motion abnormality in the mid inferior wall.  Ejection fraction     would be estimated at around 50%. 2. The right coronary artery has some progressive disease of about 60-     80% in the proximal mid segment.  The distal vessel appears to be     50-70% narrowing although may be worse after nitroglycerin was     administered. 3. The left main is free of critical disease. 4. The LAD is widely patent at the stent site with less than 30%     narrowing.  There is some diffuse luminal irregularity.  The     diagonal branch is somewhat diffusely diseased with about 70%     eccentric focal stenosis proximally. 5. The circumflex has about 30% narrowing.  There was an AV circumflex     with about 70% narrowing supplying a distal marginal branch.  The     large marginal branch has been previously stented.  The stent     itself is patent but is totally occluded at the end of the stent.     The vessel fills by retrograde collaterals from both LAD and     circumflex systems.  The bifurcation comes in.  After stenting, the     vessel was widely patent with good antegrade flow.  The segment     just distal to the stent after giving it some time appeared to be     relatively adequate leading  into the large superior branch.  The     inferior branch had some narrowing as noted previously.  CONCLUSIONS: 1. Preserved overall left ventricular function with a new wall motion     abnormality involving the mid inferior segment. 2. New total occlusion of the circumflex coronary artery with a total     occlusion of the circumflex distal to the stent site with     successful reopening with a non-drug-eluting platform. 3. Other findings as noted above.  DISPOSITION:  This was a difficult case.  The patient is typically restless even with good sedation.  Importantly, there is  new total occlusion compared to November.  The patient has stopped her Plavix as well as her Crestor.  She has smoked some although has claimed not to too much.  Risk factor adjustment will clearly be necessary.  We chose a non-drug-eluting stent because of her history of stopping Plavix in the past without really reporting it.  A lot of this is financially mediated.  I will continue to discuss the options with her, and we will do everything we can to try to get her samples.     Arturo Morton. Riley Kill, MD, [facility]     TDS/MEDQ  D:  04/30/2010  T:  05/01/2010  Job:  [id number]  Electronically Signed by Shawnie Pons MD [facility] on 06/17/2010 09:12:52 AM

## 2010-06-18 DIAGNOSIS — K922 Gastrointestinal hemorrhage, unspecified: Secondary | ICD-10-CM

## 2010-06-18 HISTORY — DX: Gastrointestinal hemorrhage, unspecified: K92.2

## 2010-07-07 ENCOUNTER — Other Ambulatory Visit: Payer: Self-pay | Admitting: Internal Medicine

## 2010-07-08 ENCOUNTER — Inpatient Hospital Stay (HOSPITAL_COMMUNITY)
Admission: EM | Admit: 2010-07-08 | Discharge: 2010-07-12 | DRG: 377 | Disposition: A | Discharge status: Home / Self Care | Payer: Medicare Other | Attending: Internal Medicine | Admitting: Internal Medicine

## 2010-07-08 ENCOUNTER — Encounter (HOSPITAL_COMMUNITY): Payer: Self-pay

## 2010-07-08 ENCOUNTER — Emergency Department (HOSPITAL_COMMUNITY): Payer: Medicare Other

## 2010-07-08 DIAGNOSIS — Z8249 Family history of ischemic heart disease and other diseases of the circulatory system: Secondary | ICD-10-CM

## 2010-07-08 DIAGNOSIS — I252 Old myocardial infarction: Secondary | ICD-10-CM

## 2010-07-08 DIAGNOSIS — D62 Acute posthemorrhagic anemia: Secondary | ICD-10-CM | POA: Diagnosis present

## 2010-07-08 DIAGNOSIS — E039 Hypothyroidism, unspecified: Secondary | ICD-10-CM | POA: Diagnosis present

## 2010-07-08 DIAGNOSIS — E876 Hypokalemia: Secondary | ICD-10-CM | POA: Diagnosis not present

## 2010-07-08 DIAGNOSIS — Z87891 Personal history of nicotine dependence: Secondary | ICD-10-CM

## 2010-07-08 DIAGNOSIS — M1990 Unspecified osteoarthritis, unspecified site: Secondary | ICD-10-CM | POA: Diagnosis present

## 2010-07-08 DIAGNOSIS — E860 Dehydration: Secondary | ICD-10-CM | POA: Diagnosis present

## 2010-07-08 DIAGNOSIS — I10 Essential (primary) hypertension: Secondary | ICD-10-CM | POA: Diagnosis present

## 2010-07-08 DIAGNOSIS — K7689 Other specified diseases of liver: Secondary | ICD-10-CM | POA: Diagnosis present

## 2010-07-08 DIAGNOSIS — R079 Chest pain, unspecified: Secondary | ICD-10-CM

## 2010-07-08 DIAGNOSIS — R933 Abnormal findings on diagnostic imaging of other parts of digestive tract: Secondary | ICD-10-CM

## 2010-07-08 DIAGNOSIS — K559 Vascular disorder of intestine, unspecified: Secondary | ICD-10-CM | POA: Diagnosis present

## 2010-07-08 DIAGNOSIS — I714 Abdominal aortic aneurysm, without rupture, unspecified: Secondary | ICD-10-CM | POA: Diagnosis present

## 2010-07-08 DIAGNOSIS — F341 Dysthymic disorder: Secondary | ICD-10-CM | POA: Diagnosis present

## 2010-07-08 DIAGNOSIS — E119 Type 2 diabetes mellitus without complications: Secondary | ICD-10-CM | POA: Diagnosis present

## 2010-07-08 DIAGNOSIS — I2510 Atherosclerotic heart disease of native coronary artery without angina pectoris: Secondary | ICD-10-CM | POA: Diagnosis present

## 2010-07-08 DIAGNOSIS — Z7982 Long term (current) use of aspirin: Secondary | ICD-10-CM

## 2010-07-08 DIAGNOSIS — K5731 Diverticulosis of large intestine without perforation or abscess with bleeding: Principal | ICD-10-CM | POA: Diagnosis present

## 2010-07-08 DIAGNOSIS — K922 Gastrointestinal hemorrhage, unspecified: Secondary | ICD-10-CM

## 2010-07-08 DIAGNOSIS — Z79899 Other long term (current) drug therapy: Secondary | ICD-10-CM

## 2010-07-08 DIAGNOSIS — R578 Other shock: Secondary | ICD-10-CM

## 2010-07-08 DIAGNOSIS — Z9861 Coronary angioplasty status: Secondary | ICD-10-CM

## 2010-07-08 LAB — DIFFERENTIAL
Basophils Absolute: 0 10*3/uL (ref 0.0–0.1)
Basophils Relative: 0 % (ref 0–1)
Basophils Relative: 0 % (ref 0–1)
Eosinophils Relative: 1 % (ref 0–5)
Eosinophils Relative: 1 % (ref 0–5)
Lymphocytes Relative: 30 % (ref 12–46)
Monocytes Absolute: 0.7 10*3/uL (ref 0.1–1.0)
Monocytes Relative: 6 % (ref 3–12)
Neutro Abs: 7.2 10*3/uL (ref 1.7–7.7)
Neutro Abs: 8.1 10*3/uL — ABNORMAL HIGH (ref 1.7–7.7)
Neutrophils Relative %: 63 % (ref 43–77)

## 2010-07-08 LAB — COMPREHENSIVE METABOLIC PANEL
ALT: 21 U/L (ref 0–35)
AST: 22 U/L (ref 0–37)
Albumin: 2.7 g/dL — ABNORMAL LOW (ref 3.5–5.2)
Albumin: 2.3 g/dL — ABNORMAL LOW (ref 3.5–5.2)
Alkaline Phosphatase: 60 U/L (ref 39–117)
BUN: 11 mg/dL (ref 6–23)
CO2: 24 mEq/L (ref 19–32)
CO2: 22 mEq/L (ref 19–32)
Calcium: 8.2 mg/dL — ABNORMAL LOW (ref 8.4–10.5)
Chloride: 104 mEq/L (ref 96–112)
Chloride: 109 mEq/L (ref 96–112)
Creatinine, Ser: 0.68 mg/dL (ref 0.50–1.10)
GFR calc non Af Amer: >60 mL/min (ref >60)
GFR calc non Af Amer: >60 mL/min (ref >60)
Potassium: 3.7 mEq/L (ref 3.5–5.1)
Sodium: 138 mEq/L (ref 135–145)
Total Bilirubin: 0.2 mg/dL — ABNORMAL LOW (ref 0.3–1.2)

## 2010-07-08 LAB — CBC
HCT: 26.9 % — ABNORMAL LOW (ref 36.0–46.0)
HCT: 31.6 % — ABNORMAL LOW (ref 36.0–46.0)
Hemoglobin: 8.9 g/dL — ABNORMAL LOW (ref 12.0–15.0)
Hemoglobin: 10.5 g/dL — ABNORMAL LOW (ref 12.0–15.0)
MCH: 28.1 pg (ref 26.0–34.0)
MCH: 28.3 pg (ref 26.0–34.0)
MCHC: 33.5 g/dL (ref 30.0–36.0)
MCHC: 33.2 g/dL (ref 30.0–36.0)
MCV: 84.9 fL (ref 78.0–100.0)
Platelets: 242 10*3/uL (ref 150–400)
RBC: 3.17 MIL/uL — ABNORMAL LOW (ref 3.87–5.11)
RDW: 13.6 % (ref 11.5–15.5)
RDW: 13.9 % (ref 11.5–15.5)
WBC: 9.9 10*3/uL (ref 4.0–10.5)
WBC: 12.9 10*3/uL — ABNORMAL HIGH (ref 4.0–10.5)

## 2010-07-08 LAB — CARDIAC PANEL(CRET KIN+CKTOT+MB+TROPI)
Relative Index: 1.3 (ref 0.0–2.5)
Relative Index: 1.4 (ref 0.0–2.5)
Total CK: 138 U/L (ref 7–177)
Total CK: 120 U/L (ref 7–177)
Troponin I: <0.3 ng/mL (ref <0.30)

## 2010-07-08 LAB — APTT: aPTT: 26 seconds (ref 24–37)

## 2010-07-08 LAB — HEMOGLOBIN AND HEMATOCRIT, BLOOD
HCT: 28.2 % — ABNORMAL LOW (ref 36.0–46.0)
HCT: 27.4 % — ABNORMAL LOW (ref 36.0–46.0)
Hemoglobin: 9.2 g/dL — ABNORMAL LOW (ref 12.0–15.0)

## 2010-07-08 LAB — LACTIC ACID, PLASMA: Lactic Acid, Venous: 1.5 mmol/L (ref 0.5–2.2)

## 2010-07-08 LAB — PROTIME-INR: INR: 1.19 (ref 0.00–1.49)

## 2010-07-08 LAB — MAGNESIUM: Magnesium: 1.7 mg/dL (ref 1.5–2.5)

## 2010-07-08 LAB — GLUCOSE, CAPILLARY: Glucose-Capillary: 143 mg/dL — ABNORMAL HIGH (ref 70–99)

## 2010-07-08 LAB — OCCULT BLOOD, POC DEVICE: Fecal Occult Bld: POSITIVE

## 2010-07-08 LAB — MRSA PCR SCREENING: MRSA by PCR: NEGATIVE

## 2010-07-08 NOTE — H&P (Signed)
[name] NO.:  [id number]  MEDICAL RECORD NO.:  [id number]  LOCATION:  WLED                         FACILITY:  [facility]  PHYSICIAN:  Talmage Nap, MD  DATE OF BIRTH:  08/12/1945  DATE OF ADMISSION:  07/08/2010 DATE OF DISCHARGE:                             HISTORY & PHYSICAL   PRIMARY CARE DOCTOR:  Corwin Levins, MD  CARDIOLOGIST:  Arturo Morton. Riley Kill, MD, [facility]  ORTHOPEDIC SURGEON:  Sharolyn Douglas, M.D.  History obtainable from the patient.  CHIEF COMPLAINT:  Multiple episodes of painless bloody stool with weakness.  HISTORY OF PRESENT ILLNESS:  The patient is a 65 year old African American female with a history of coronary artery disease, status post cardiac cath plus stent presenting to the emergency room with painless bloody diarrhea or weakness, which started on the evening of presentation.  The patient claimed that she had been in stable health until around the evening of presentation to the emergency room when she has had passing blood in stool.  The patient claimed she ate a variety of food including beans and thereafter was having recurrent episodes of bloody diarrhea.  The blood was said to be coming out in clots.  She denied any associated abdominal pain.  She denied any fever.  She denied any chills.  She denied any rigor.  She also denied any history of vomiting, but the bloody diarrhea was said to be persistent and she was getting progressively weak, dehydrated, and diaphoretic.  She also denied any history of chest pain or shortness of breath and subsequently the patient called EMS and brought the patient to the hospital to be evaluated.  PAST MEDICAL HISTORY:  Positive for, 1. Coronary artery disease. 2. Hypertension. 3. Diabetes mellitus. 4. Abdominal aortic aneurysm. 5. Prior MI.  PAST SURGICAL HISTORY: 1. Lower back, status post rod implant. 2. Coronary artery disease, status post stent. 3. Cesarean section. 4.  Hysterectomy.  PREADMISSION MEDICATIONS:  Without dosages include, 1. Metformin. 2. Atenolol. 3. Effient. 4. Aspirin.  ALLERGIES:  She has no known allergies.  SOCIAL HISTORY:  Negative for alcohol or tobacco use.  The patient is going to retire.  FAMILY HISTORY:  Father died of massive MI.  REVIEW OF SYSTEMS:  She denies any history of headaches.  No blurred vision.  No nausea.  No vomiting.  Complained of excessive thirst and demanding for water.  Denies any chest pain or shortness of breath.  No PND or orthopnea.  No cough.  No abdominal discomfort.  There are no multiple episodes of bloody diarrhea.  No dysuria.  No swelling of the lower extremities.  No intolerance to heat or cold and no neuropsychiatric disorder.  PHYSICAL EXAMINATION:  GENERAL:  The patient is a very pleasant lady, severely dehydrated, not in any obvious respiratory distress at present. VITAL SIGNS:  Blood pressure is 95/57, pulse is 72, respirations 17, temperature is 98,1. HEENT:  Pallor, but pupils are reactive to light and extraocular muscles are intact. NECK:  No jugular venous distention.  No carotid bruit.  No lymphadenopathy. CHEST:  Clear to auscultation, heart sounds are 102. ABDOMEN:  Soft with vague suprapubic discomfort.  No guarding.  No rigidity.  Liver, spleen, kidney not palpable.  Bowel sounds are hypoactive. EXTREMITIES:  No pedal edema. NEUROLOGIC:  Nonfocal. MUSCULOSKELETAL:  Unremarkable. SKIN:  Markedly decreased turgor.  LABORATORY DATA:  Coagulation profile show APTT 26.  PT 15.4, INR 1.19. Hematological indices showed WBC of 9.9, hemoglobin of 11.2, hematocrit of 33.4, MCV of 85.6 with a platelet count of 246 with normal differential.  Chemistry showed a sodium of 138, potassium of 3.7, chloride of 104 with a bicarb of 24, glucose is 197, BUN is 12, creatinine 0.37.  CT of the abdomen and pelvis done without contrast showed wall thickening and pericolic inflammatory  changes about the descending colon consistent with colitis, likely infectious or inflammatory.  The patient also has multiple diverticula, the finding maybe due to the diverticulitis.  No abscess.  There is fatty infiltration of the liver and there is 4.1 cm abdominal aortic aneurysm.  IMPRESSION:1.  Painless hematochezia, questionable secondary to diverticular disease.  Differential will also include ischemic colitis.  OTHER MEDICAL PROBLEMS: 2. Hypotension. 3. Dehydration. 4. Coronary artery disease, status post cardiac cath plus stent. 5. Diabetes mellitus. 6. Abdominal aortic aneurysm.  PLAN:  Plan is to admit the patient to general medical floor.  The patient will be adequately rehydrated with normal saline IV to go at a rate of 125 cc an hour, Protonix 40 mg IV q.24, Accu-Cheks t.i.d. with a.c. and h.s. with regular insulin sliding scale (moderate scale).  DVT prophylaxis with TED stocking or SCDs boots.  Further workup to be done on this patient will include H and H q.6 hourly, CBC, CMP, and magnesium repeated in a.m. The patient will be followed and evaluated on day-to-day.     Talmage Nap, MD     CN/MEDQ  D:  07/08/2010  T:  07/08/2010  Job:  [id number]  Electronically Signed by Talmage Nap  on 07/08/2010 05:22:50 AM

## 2010-07-08 NOTE — Telephone Encounter (Signed)
Pt is this AM in Rm 1234 at [facility] under attending Dr Riley Kill  I will hold on any refills at this time

## 2010-07-09 LAB — GLUCOSE, CAPILLARY
Glucose-Capillary: 114 mg/dL — ABNORMAL HIGH (ref 70–99)
Glucose-Capillary: 142 mg/dL — ABNORMAL HIGH (ref 70–99)
Glucose-Capillary: 164 mg/dL — ABNORMAL HIGH (ref 70–99)
Glucose-Capillary: 201 mg/dL — ABNORMAL HIGH (ref 70–99)

## 2010-07-09 LAB — PREPARE FRESH FROZEN PLASMA

## 2010-07-09 LAB — HEMOGLOBIN AND HEMATOCRIT, BLOOD
HCT: 27.2 % — ABNORMAL LOW (ref 36.0–46.0)
HCT: 25 % — ABNORMAL LOW (ref 36.0–46.0)
HCT: 24.3 % — ABNORMAL LOW (ref 36.0–46.0)
Hemoglobin: 8.9 g/dL — ABNORMAL LOW (ref 12.0–15.0)

## 2010-07-10 LAB — GLUCOSE, CAPILLARY
Glucose-Capillary: 172 mg/dL — ABNORMAL HIGH (ref 70–99)
Glucose-Capillary: 134 mg/dL — ABNORMAL HIGH (ref 70–99)
Glucose-Capillary: 113 mg/dL — ABNORMAL HIGH (ref 70–99)
Glucose-Capillary: 179 mg/dL — ABNORMAL HIGH (ref 70–99)

## 2010-07-10 LAB — BASIC METABOLIC PANEL
BUN: 5 mg/dL — ABNORMAL LOW (ref 6–23)
Chloride: 110 mEq/L (ref 96–112)
GFR calc Af Amer: >60 mL/min (ref >60)
GFR calc non Af Amer: >60 mL/min (ref >60)
Potassium: 3.3 mEq/L — ABNORMAL LOW (ref 3.5–5.1)
Sodium: 141 mEq/L (ref 135–145)

## 2010-07-10 LAB — CBC
HCT: 23.7 % — ABNORMAL LOW (ref 36.0–46.0)
MCHC: 32.9 g/dL (ref 30.0–36.0)
RDW: 13.7 % (ref 11.5–15.5)
WBC: 8.5 10*3/uL (ref 4.0–10.5)

## 2010-07-10 LAB — HEMOGLOBIN AND HEMATOCRIT, BLOOD
HCT: 27.3 % — ABNORMAL LOW (ref 36.0–46.0)
Hemoglobin: 9.3 g/dL — ABNORMAL LOW (ref 12.0–15.0)

## 2010-07-10 LAB — URINE CULTURE: Culture  Setup Time: 201206220112

## 2010-07-11 ENCOUNTER — Encounter: Payer: Self-pay | Admitting: Cardiology

## 2010-07-11 DIAGNOSIS — R933 Abnormal findings on diagnostic imaging of other parts of digestive tract: Secondary | ICD-10-CM

## 2010-07-11 DIAGNOSIS — D62 Acute posthemorrhagic anemia: Secondary | ICD-10-CM

## 2010-07-11 DIAGNOSIS — K922 Gastrointestinal hemorrhage, unspecified: Secondary | ICD-10-CM

## 2010-07-11 LAB — GLUCOSE, CAPILLARY
Glucose-Capillary: 180 mg/dL — ABNORMAL HIGH (ref 70–99)
Glucose-Capillary: 118 mg/dL — ABNORMAL HIGH (ref 70–99)
Glucose-Capillary: 165 mg/dL — ABNORMAL HIGH (ref 70–99)
Glucose-Capillary: 173 mg/dL — ABNORMAL HIGH (ref 70–99)

## 2010-07-11 LAB — BASIC METABOLIC PANEL
BUN: 3 mg/dL — ABNORMAL LOW (ref 6–23)
Calcium: 8.1 mg/dL — ABNORMAL LOW (ref 8.4–10.5)
Chloride: 109 mEq/L (ref 96–112)
Creatinine, Ser: 0.8 mg/dL (ref 0.50–1.10)
GFR calc Af Amer: >60 mL/min (ref >60)

## 2010-07-11 LAB — CBC
HCT: 26.3 % — ABNORMAL LOW (ref 36.0–46.0)
MCH: 28.6 pg (ref 26.0–34.0)
MCV: 85.4 fL (ref 78.0–100.0)
Platelets: 215 10*3/uL (ref 150–400)
RDW: 14 % (ref 11.5–15.5)
WBC: 8.3 10*3/uL (ref 4.0–10.5)

## 2010-07-12 ENCOUNTER — Telehealth: Payer: Self-pay | Admitting: Internal Medicine

## 2010-07-12 DIAGNOSIS — I2510 Atherosclerotic heart disease of native coronary artery without angina pectoris: Secondary | ICD-10-CM

## 2010-07-12 LAB — GLUCOSE, CAPILLARY
Glucose-Capillary: 134 mg/dL — ABNORMAL HIGH (ref 70–99)
Glucose-Capillary: 134 mg/dL — ABNORMAL HIGH (ref 70–99)

## 2010-07-12 LAB — TYPE AND SCREEN
ABO/RH(D): O POS
Unit division: 0
Unit division: 0

## 2010-07-12 LAB — HEMOGLOBIN AND HEMATOCRIT, BLOOD: HCT: 27.6 % — ABNORMAL LOW (ref 36.0–46.0)

## 2010-07-12 LAB — BASIC METABOLIC PANEL
CO2: 26 mEq/L (ref 19–32)
Calcium: 8.8 mg/dL (ref 8.4–10.5)
Chloride: 108 mEq/L (ref 96–112)
Glucose, Bld: 133 mg/dL — ABNORMAL HIGH (ref 70–99)
Potassium: 3.6 mEq/L (ref 3.5–5.1)
Sodium: 141 mEq/L (ref 135–145)

## 2010-07-12 NOTE — Telephone Encounter (Signed)
[name] WANTS TO SWITCH FROM DR Jonny Ruiz TO DR NORINS DUE TO DR Debby Bud GOING TO THE HOSPITAL.  PT WILL NEED TO BE SCHEDULED FOR A POST HOSPITAL WHEN WE CALL HER BACK.

## 2010-07-12 NOTE — Telephone Encounter (Signed)
I believe that Ms. Seto used to see me and switched to Dr. Jonny Ruiz, preferring his care. I recommend she stay with him

## 2010-07-12 NOTE — Telephone Encounter (Signed)
Ok with me 

## 2010-07-13 NOTE — Telephone Encounter (Signed)
PT IS AWARE DR NORINS WANTS HER TO STAY WITH DR Jonny Ruiz.  SHE MADE AN APPT WITH DR Jonny Ruiz.

## 2010-07-13 NOTE — Discharge Summary (Signed)
NAMEALAZAY, [name] NO.:  [id number]  MEDICAL RECORD NO.:  [id number]  LOCATION:  1402                         FACILITY:  [facility]  PHYSICIAN:  Hillery Aldo, M.D.   DATE OF BIRTH:  1945-12-05  DATE OF ADMISSION:  07/08/2010 DATE OF DISCHARGE:  07/12/2010                              DISCHARGE SUMMARY   PRIMARY CARE PHYSICIAN:  Corwin Levins, MD  CARDIOLOGIST:  Arturo Morton. Riley Kill, MD, [facility]  GASTROENTEROLOGIST:  Barbette Hair. Arlyce Dice, MD, [facility]  DISCHARGE DIAGNOSES: 1. Diverticular hemorrhage. 2. Ischemic colitis. 3. Coronary artery disease. 4. Transient chest pain. 5. History of hypertension. 6. Type 2 diabetes. 7. History of abdominal aortic aneurysm. 8. Fatty liver. 9. Hypothyroidism. 10.Hypokalemia. 11.Liver lesions/complex cystic per MRI March 2012, followup MRI     September 2012 by Dr. Arlyce Dice. 12.Acute blood loss anemia.  DISCHARGE MEDICATIONS 1.  Iron sulfate 325 mg P.O. b.i.d. 2.  Atenolol 25 mg P.O. b.i.d. 3.  Vicodin 5/500, 1 tablet P.O. q 6 hours p.r.n. 4.  Metformin 1500 mg P.O. daily 5.  Nitroglycerin SL 0.4 mg SL q 5 minutes p.r.n. 6.  Synthroid 150 mcg P.O. daily  CONSULTATIONS: 1. Malcolm T. Russella Dar, MD, [facility] of Gastroenterology. 2. Leslye Peer, MD of Pulmonary Critical Care Medicine. 3. W. Viann Fish, M.D. of Cardiology.  BRIEF ADMISSION HPI:  The patient is a 65 year old female with past medical history of coronary artery disease status post bare metal stent, who presented to the emergency department with a chief complaint of bloody diarrhea.  The patient has been on Effient and aspirin prior to presentation and stated to have passed a large amount of blood and bloody clots, progressing to weakness and feeling faint.  Upon initial evaluation in the emergency department, the patient was found to have a hemoglobin of 11.2 and subsequently was referred to the hospitalist service for further evaluation and treatment.  For full  details, please see the dictated report done by Dr. Beverly Gust.  PROCEDURES AND DIAGNOSTIC STUDIES:  CT scan of the abdomen and pelvis on July 08, 2010 showed wall thickening and pericolonic inflammatory changes about the descending colon consistent with colitis, likely infectious or inflammatory.  Multiple diverticula.  No abscess.  Fatty infiltration of the liver.  Two nonspecific low attenuation lesions identified in the liver, more fully described on report of MRI on April 13, 2010.  4.1-cm abdominal aortic aneurysm.  DISCHARGE LABORATORY VALUES:  Sodium was 141, potassium 3.6, chloride 108, bicarb 26, BUN 7, creatinine 0.79, glucose 133, calcium 8.8. Hemoglobin was 9.1 with a hematocrit of 27.6.  TSH was 0.678.  HOSPITAL COURSE BY PROBLEM: 1. Diverticular bleed in the setting of ischemic colitis and prior     treatment with Effient and aspirin:  The patient was admitted to     the hospital.  Upon re-evaluation the following morning, the     patient was found to have copious rectal bleeding through the     night.  She was hypotensive and given IV fluid boluses and packed     red blood cells.  She was transferred to the ICU for impending     hemorrhagic shock.  Consultation with  critical care, cardiology,     and gastroenterology was requested.  She was empirically started on     Cipro and Flagyl for the possibility of infectious colitis.  She     was given 2 packed red blood cells with subsequent stabilization of     her hemodynamic status.  Her hemoglobin and hematocrit levels were     followed q.6h. until stable.  She had one further episode of rectal     bleeding and was transfused an additional third unit of blood with     no further episodes of bleeding 48 hours prior to discharge.  Her     Effient and aspirin have remained on hold throughout her hospital     stay and will remain on hold post discharge.  She can possibly     resume aspirin once she follows up with the her  gastroenterologist     if her hemoglobin and hematocrit remain stable but we will defer to     her gastroenterologist regarding the timing of these this. 2. Acute blood loss anemia:  The patient did have a drop in her     hemoglobin from baseline values of 11.2 to 8.9.  She subsequently     was transfused 2 units with post-transfusion hemoglobin of 10.5.     Gradually trended down.  She was transfused again after an episode     of bloody stool.  Her hemoglobin is now 9.1. 3. Coronary artery disease/chest pain:  The patient did complain of     transient chest pain when actively bleeding.  Because of her recent     bare metal stent placement, cardiology consultation was requested.     The patient does have a non-drug eluting stent and this was     somewhat reassuring.  The patient was supported medically with IV     fluids and blood transfusion.  12-lead EKG did not show any EKG     changes.  Cardiac markers were cycled q.6h. and were negative.     This point, the patient will be medically treated with beta     blockade, but no aspirin or Effient post discharge.  Consideration     for resumption of aspirin once she is stable for a period of time,     can be reconsidered and is encouraged by the cardiologist. 4. Hypertension:  The patient was hypotensive earlier in the hospital     course and necessitated fluid volume resuscitation and packed red     blood cells for restoration of her blood pressure.  Her atenolol     was held during this time and has gradually been re-introduced.     She will be discharged on atenolol at 25 mg q.12h. 5. Type 2 diabetes:  The patient was maintained on sliding scale     insulin while in the hospital.  She can safely resume her metformin     therapy at discharge. 6. History of abdominal aortic aneurysm:  Stable by CT scan. 7. Fatty liver:  The patient can follow up with her primary care     physician regarding this. 8. Hypothyroidism.  The patient's TSH was  checked and found to be     within normal limits.  She is maintained on her usual dose of     Synthroid which she should continue post discharge. 9. Hypokalemia:  The patient's potassium was fully repleted and her     discharge potassium was 3.6. 10.Complex cystic  liver lesions:  The patient had an MRI  back in     March showing these and has a followup MRI scheduled in September.  DISPOSITION:  The patient is medically stable and has been cleared for discharge by gastroenterology as well as cardiology.  DISCHARGE INSTRUCTIONS:  Increase activity slowly.  DIET:  Low-sodium, heart-healthy, diabetic, low fiber.  FOLLOWUP:  Dr. Arlyce Dice on July 29, 2010 at 8:45 a.m.  Follow up with either Dr. Oliver Barre or Dr. Debby Bud (the patient wishing to switch her primary care physician because Dr. Jonny Ruiz does not come to the hospital).  OTHER INSTRUCTIONS:  Call 911 if you have any further bloody stools, significant dizziness or chest pain.  Time spent on discharge including face-to-face time equals approximately 35 minutes.     Hillery Aldo, M.D.     CR/MEDQ  D:  07/12/2010  T:  07/12/2010  Job:  [id number]  cc:   Corwin Levins, MD [address] [place] [address]  Arturo Morton. Riley Kill, MD, [facility] [address] [place] [address]  Barbette Hair. Arlyce Dice, MD,FACG [address] [place] [address]  Electronically Signed by Hillery Aldo M.D. on 07/13/2010 06:06:38 PM

## 2010-07-14 LAB — CULTURE, BLOOD (ROUTINE X 2)
Culture  Setup Time: 201206211250
Culture  Setup Time: 201206211250
Culture: NO GROWTH
Culture: NO GROWTH

## 2010-07-22 ENCOUNTER — Encounter: Payer: Self-pay | Admitting: Internal Medicine

## 2010-07-22 DIAGNOSIS — Z0001 Encounter for general adult medical examination with abnormal findings: Secondary | ICD-10-CM | POA: Insufficient documentation

## 2010-07-23 ENCOUNTER — Telehealth: Payer: Self-pay | Admitting: Internal Medicine

## 2010-07-23 ENCOUNTER — Ambulatory Visit (INDEPENDENT_AMBULATORY_CARE_PROVIDER_SITE_OTHER): Payer: Medicare Other | Admitting: Internal Medicine

## 2010-07-23 ENCOUNTER — Other Ambulatory Visit (INDEPENDENT_AMBULATORY_CARE_PROVIDER_SITE_OTHER): Payer: Medicare Other

## 2010-07-23 ENCOUNTER — Encounter: Payer: Self-pay | Admitting: Internal Medicine

## 2010-07-23 VITALS — BP 92/58 | HR 80 | Temp 98.4°F | Ht 66.0 in | Wt 228.1 lb

## 2010-07-23 DIAGNOSIS — R5383 Other fatigue: Secondary | ICD-10-CM

## 2010-07-23 DIAGNOSIS — E785 Hyperlipidemia, unspecified: Secondary | ICD-10-CM

## 2010-07-23 DIAGNOSIS — E119 Type 2 diabetes mellitus without complications: Secondary | ICD-10-CM

## 2010-07-23 DIAGNOSIS — J309 Allergic rhinitis, unspecified: Secondary | ICD-10-CM

## 2010-07-23 DIAGNOSIS — K769 Liver disease, unspecified: Secondary | ICD-10-CM | POA: Insufficient documentation

## 2010-07-23 DIAGNOSIS — K922 Gastrointestinal hemorrhage, unspecified: Secondary | ICD-10-CM | POA: Insufficient documentation

## 2010-07-23 DIAGNOSIS — R5381 Other malaise: Secondary | ICD-10-CM

## 2010-07-23 DIAGNOSIS — M79641 Pain in right hand: Secondary | ICD-10-CM | POA: Insufficient documentation

## 2010-07-23 DIAGNOSIS — M79609 Pain in unspecified limb: Secondary | ICD-10-CM

## 2010-07-23 LAB — TSH: TSH: 0.85 u[IU]/mL (ref 0.35–5.50)

## 2010-07-23 LAB — CBC WITH DIFFERENTIAL/PLATELET
Basophils Relative: 0.5 % (ref 0.0–3.0)
Eosinophils Absolute: 0.2 10*3/uL (ref 0.0–0.7)
Eosinophils Relative: 2.7 % (ref 0.0–5.0)
HCT: 36 % (ref 36.0–46.0)
Lymphs Abs: 2.4 10*3/uL (ref 0.7–4.0)
MCHC: 33.1 g/dL (ref 30.0–36.0)
MCV: 87.9 fl (ref 78.0–100.0)
Monocytes Absolute: 0.6 10*3/uL (ref 0.1–1.0)
Platelets: 433 10*3/uL — ABNORMAL HIGH (ref 150.0–400.0)
WBC: 9 10*3/uL (ref 4.5–10.5)

## 2010-07-23 LAB — HEPATIC FUNCTION PANEL
AST: 36 U/L (ref 0–37)
Bilirubin, Direct: 0.1 mg/dL (ref 0.0–0.3)
Total Bilirubin: 0.3 mg/dL (ref 0.3–1.2)

## 2010-07-23 LAB — BASIC METABOLIC PANEL
BUN: 11 mg/dL (ref 6–23)
Chloride: 107 mEq/L (ref 96–112)
GFR: 87.53 mL/min (ref >60.00)
Potassium: 4 mEq/L (ref 3.5–5.1)
Sodium: 141 mEq/L (ref 135–145)

## 2010-07-23 MED ORDER — FEXOFENADINE HCL 180 MG PO TABS: 180.0000 mg | ORAL_TABLET | Freq: Every day | ORAL | Status: DC | PRN

## 2010-07-23 MED ORDER — ROSUVASTATIN CALCIUM 20 MG PO TABS: 20.0000 mg | ORAL_TABLET | Freq: Every day | ORAL | Status: DC

## 2010-07-23 MED ORDER — HYDROCODONE-ACETAMINOPHEN 10-325 MG PO TABS: 1.0000 | ORAL_TABLET | Freq: Every evening | ORAL | Status: DC | PRN

## 2010-07-23 MED ORDER — PREDNISONE 20 MG PO TABS: 20.0000 mg | ORAL_TABLET | Freq: Every day | ORAL | Status: DC

## 2010-07-23 MED ORDER — METHYLPREDNISOLONE ACETATE 80 MG/ML IJ SUSP
120.0000 mg | Freq: Once | INTRAMUSCULAR | Status: AC
  Administered 2010-07-23: 120 mg via INTRAMUSCULAR

## 2010-07-23 NOTE — Assessment & Plan Note (Addendum)
stable overall by hx and exam, most recent data reviewed with pt, and pt to continue medical treatment as before  Lab Results  Component Value Date   HGBA1C  Value: 6.9 (NOTE)                                                                       According to the ADA Clinical Practice Recommendations for 2011, when HbA1c is used as a screening test:   >=6.5%   Diagnostic of Diabetes Mellitus           (if abnormal result  is confirmed)  5.7-6.4%   Increased risk of developing Diabetes Mellitus  References:Diagnosis and Classification of Diabetes Mellitus,Diabetes Care,2011,34(Suppl 1):S62-S69 and Standards of Medical Care in         Diabetes - 2011,Diabetes Care,2011,34  (Suppl 1):S11-S61.* 04/10/2010   For lab today

## 2010-07-23 NOTE — Assessment & Plan Note (Signed)
Mild to mod, for allegra re-start and depomedrol IM today,  to f/u any worsening symptoms or concerns

## 2010-07-23 NOTE — Patient Instructions (Addendum)
You had the steroid shot today Take all new medications as prescribed - the prednisone for home, and the crestor You can also take allegra as needed for allergies Please go to LAB in the Basement for the blood and/or urine tests to be done today Please call the phone number [phone] (the PhoneTree System) for results of testing in 2-3 days;  When calling, simply dial the number, and when prompted enter the MRN number above (the Medical Record Number) and the # key, then the message should start. Please keep your appointments with your specialists as you have planned - Dr Malka So and GI/Dr Arlyce Dice

## 2010-07-23 NOTE — Progress Notes (Signed)
Addended by: Scharlene Gloss B on: 07/23/2010 02:33 PM   Modules accepted: Orders

## 2010-07-23 NOTE — Telephone Encounter (Signed)
Rx faxed to pharmacy  

## 2010-07-23 NOTE — Telephone Encounter (Signed)
Done to [name] - please fax to pharmacy

## 2010-07-23 NOTE — Assessment & Plan Note (Signed)
D/w pt import of taking her crestor to reduce likelihood of further CAD;  Pt will start;  rx given today

## 2010-07-23 NOTE — Assessment & Plan Note (Signed)
With recent anemia and co-morbids likely the cuase, Etiology unclear, Exam otherwise benign, to check labs as documented, follow with expectant management

## 2010-07-23 NOTE — Progress Notes (Signed)
Subjective:    Patient ID: [name], female    DOB: 09-Aug-1945, 65 y.o.   MRN: [id number]  HPI  Here after recent hospn with diverticular GI bleed, anemia, ischemic colitis, after April 2012 cath with stent, on effient, s/p total 3 units PRBC.  D/c hgb 9.1  Cont's to have no pain of any kind as with her admission.  Now off Effient and ASA as well.  C/o general weakness but no further GI blood loss or other blood loss.  Has not yet started the crestor due to her "bad experience" with the effient.  Due for refill pain med per pt.  Pt denies chest pain, increased sob or doe, wheezing, orthopnea, PND, increased LE swelling, palpitations, dizziness or syncope.  Pt denies new neurological symptoms such as new headache, or facial or extremity weakness or numbness   Pt denies polydipsia, polyuria,  Pt states overall good compliance with meds that she is supposed to take except the crestor, trying to follow lower cholesterol, diabetic diet, wt overall stable but little exercise however.   Does have right palmar heat, puffiness and hard to make a fist, tender to touch, uses the right hand more than the left.  Does have several wks ongoing nasal allergy symptoms with clear congestion, itch and sneeze, without fever, pain, ST, cough or wheezing, but does have right ear pressure as well.  No fever.  Has appt cardiology in 3 days, with GI July 12 Past Medical History  Diagnosis Date  . Coronary artery disease     PCI of the left circumflex and LAD  . Hyperlipidemia   . Hypertension   . Obesity, unspecified     MODERATE  . Thyroid disease     HYPOTHYROIDISM  . Diabetes mellitus     TYPE II  . Anxiety state, unspecified   . Depressive disorder, not elsewhere classified   . Unspecified menopausal and postmenopausal disorder   . Simple or unspecified chronic serous otitis media   . Degeneration of lumbar or lumbosacral intervertebral disc   . Allergic rhinitis, cause unspecified   . Hx of hysterectomy   .  Lower GI bleed 07/23/2010   Past Surgical History  Procedure Date  . Total abdominal hysterectomy   . Lumbar fusion 01/2007    DR. Noel Gerold...3-LEVEL WITH FIXATION  . Parathyroidectomy   . Thyroidectomy   . Cardiac catheterization     PCI OF BOTH THE CIRCUMFLEX AND LEFT ANTERIOR DESCENDING ARTERY  . Cesarean section   . Heart stent 04-2010    X1    reports that she has quit smoking. Her smoking use included Cigarettes. She has never used smokeless tobacco. She reports that she does not drink alcohol or use illicit drugs. family history includes Heart attack (age of onset:40) in her mother and Heart attack (age of onset:52) in her father.  There is no history of Colon cancer. Allergies  Allergen Reactions  . Doxycycline     REACTION: gi upset  . Miconazole Nitrate     REACTION: hives  . Prilosec (Omeprazole) Other (See Comments)    Chest pain   Current Outpatient Prescriptions on File Prior to Visit  Medication Sig Dispense Refill  . atenolol (TENORMIN) 25 MG tablet Take 25 mg by mouth daily.       . Diazepam (VALIUM PO) Take 1 tablet by mouth as needed.        . fexofenadine (ALLEGRA) 180 MG tablet Take 180 mg by mouth daily as needed.        [name]  levothyroxine (SYNTHROID, LEVOTHROID) 150 MCG tablet Take 150 mcg by mouth daily.        . metFORMIN (GLUCOPHAGE-XR) 500 MG 24 hr tablet 500 mg. 2 tablets AM and 1 tablet PM      . nitroGLYCERIN (NITROSTAT) 0.4 MG SL tablet Place 0.4 mg under the tongue every 5 (five) minutes as needed.        . rosuvastatin (CRESTOR) 20 MG tablet Take 20 mg by mouth daily.        [name] DISCONTD: aspirin 81 MG EC tablet Take 81 mg by mouth daily.        [name] DISCONTD: prasugrel (EFFIENT) 10 MG TABS Take 1 tablet (10 mg total) by mouth daily.  30 tablet  11   Current Facility-Administered Medications on File Prior to Visit  Medication Dose Route Frequency Provider Last Rate Last Dose  . 0.9 %  sodium chloride infusion  500 mL Intravenous Continuous Robbin Garald Braver, RN       Review of Systems Review of Systems  Constitutional: Negative for diaphoresis and unexpected weight change.  HENT: Negative for drooling and tinnitus.   Eyes: Negative for photophobia and visual disturbance.  Respiratory: Negative for choking and stridor.   Gastrointestinal: Negative for vomiting and blood in stool.  Genitourinary: Negative for hematuria and decreased urine volume.     Objective:   Physical Exam BP 92/58  Pulse 80  Temp(Src) 98.4 F (36.9 C) (Oral)  Ht 5\' 6"  (1.676 m)  Wt 228 lb 2 oz (103.477 kg)  BMI 36.82 kg/m2  SpO2 98% Physical Exam  VS noted Constitutional: Pt appears well-developed and well-nourished.  HENT: Head: Normocephalic.  Right Ear: External ear normal Right TM mild erythema.  Left Ear: External ear normal. Left TM normal appearance Eyes: Conjunctivae and EOM are normal. Pupils are equal, round, and reactive to light.  Neck: Normal range of motion. Neck supple.  Cardiovascular: Normal rate and regular rhythm.   Pulmonary/Chest: Effort normal and breath sounds normal.  Abd:  Soft, NT, non-distended, + BS Neurological: Pt is alert. No cranial nerve deficit.  Skin: Skin is warm. No erythema.  Psychiatric: Pt behavior is normal. Thought content normal.  Right palmar mild diffuse puffiness and erythema with palmar tendon tenderness throughout, non itchy, doubt cellulitis, o/w neurovasc intact, but cannot make fist as normal , as she can with left today       Assessment & Plan:

## 2010-07-27 ENCOUNTER — Ambulatory Visit (INDEPENDENT_AMBULATORY_CARE_PROVIDER_SITE_OTHER): Payer: Medicare Other | Admitting: Cardiology

## 2010-07-27 ENCOUNTER — Encounter: Payer: Self-pay | Admitting: Cardiology

## 2010-07-27 DIAGNOSIS — E785 Hyperlipidemia, unspecified: Secondary | ICD-10-CM

## 2010-07-27 DIAGNOSIS — K922 Gastrointestinal hemorrhage, unspecified: Secondary | ICD-10-CM

## 2010-07-27 DIAGNOSIS — I2510 Atherosclerotic heart disease of native coronary artery without angina pectoris: Secondary | ICD-10-CM

## 2010-07-27 DIAGNOSIS — K625 Hemorrhage of anus and rectum: Secondary | ICD-10-CM

## 2010-07-27 LAB — CBC WITH DIFFERENTIAL/PLATELET
Basophils Absolute: 0 10*3/uL (ref 0.0–0.1)
Eosinophils Absolute: 0.2 10*3/uL (ref 0.0–0.7)
HCT: 38.4 % (ref 36.0–46.0)
Lymphs Abs: 2.4 10*3/uL (ref 0.7–4.0)
MCV: 88.1 fl (ref 78.0–100.0)
Monocytes Absolute: 0.6 10*3/uL (ref 0.1–1.0)
Neutrophils Relative %: 68.2 % (ref 43.0–77.0)
Platelets: 387 10*3/uL (ref 150.0–400.0)
RDW: 14.3 % (ref 11.5–14.6)

## 2010-07-27 NOTE — Patient Instructions (Signed)
Your physician recommends that you schedule a follow-up appointment in: 1 week with Dr. Riley Kill

## 2010-07-29 ENCOUNTER — Encounter: Payer: Self-pay | Admitting: Gastroenterology

## 2010-07-29 ENCOUNTER — Ambulatory Visit (INDEPENDENT_AMBULATORY_CARE_PROVIDER_SITE_OTHER): Payer: Medicare Other | Admitting: Gastroenterology

## 2010-07-29 DIAGNOSIS — R933 Abnormal findings on diagnostic imaging of other parts of digestive tract: Secondary | ICD-10-CM

## 2010-07-29 DIAGNOSIS — K55039 Acute (reversible) ischemia of large intestine, extent unspecified: Secondary | ICD-10-CM

## 2010-07-29 DIAGNOSIS — K55059 Acute (reversible) ischemia of intestine, part and extent unspecified: Secondary | ICD-10-CM

## 2010-07-29 HISTORY — DX: Acute (reversible) ischemia of large intestine, extent unspecified: K55.039

## 2010-07-29 NOTE — Assessment & Plan Note (Signed)
She had an acute lower GI bleed either from ischemic colitis or, less likely, and diverticulitis. This has resolved. No further work up is anticipated.

## 2010-07-29 NOTE — Progress Notes (Signed)
History of Present Illness:  [name] has returned following hospitalization for acute lower GI bleed.  She was seen in April, 2012 for chest pain. She subsequently underwent cardiac catheterization with stent placement.  Upper endoscopy was negative. She was admitted in June, 2012 with acute lower GI bleed. CT scan showed inflammatory changes in the left colon consistent with either ischemic colitis or diverticulitis. She was treated with antibiotics and bleeding subsided spontaneously. She's had no further recurrences.    Review of Systems: Pertinent positive and negative review of systems were noted in the above HPI section. All other review of systems were otherwise negative.    Current Medications, Allergies, Past Medical History, Past Surgical History, Family History and Social History were reviewed in [employer] [facility] electronic medical record  Vital signs were reviewed in today's medical record. Physical Exam: General: Well developed , well nourished, no acute distress

## 2010-07-29 NOTE — Patient Instructions (Signed)
Follow up as needed

## 2010-08-02 ENCOUNTER — Ambulatory Visit (INDEPENDENT_AMBULATORY_CARE_PROVIDER_SITE_OTHER): Payer: Medicare Other | Admitting: Cardiology

## 2010-08-02 ENCOUNTER — Encounter: Payer: Self-pay | Admitting: Cardiology

## 2010-08-02 VITALS — BP 104/75 | HR 77 | Resp 18 | Ht 66.0 in | Wt 226.0 lb

## 2010-08-02 DIAGNOSIS — I714 Abdominal aortic aneurysm, without rupture, unspecified: Secondary | ICD-10-CM

## 2010-08-02 DIAGNOSIS — I10 Essential (primary) hypertension: Secondary | ICD-10-CM

## 2010-08-02 DIAGNOSIS — E785 Hyperlipidemia, unspecified: Secondary | ICD-10-CM

## 2010-08-02 DIAGNOSIS — K922 Gastrointestinal hemorrhage, unspecified: Secondary | ICD-10-CM

## 2010-08-02 DIAGNOSIS — I2510 Atherosclerotic heart disease of native coronary artery without angina pectoris: Secondary | ICD-10-CM

## 2010-08-02 MED ORDER — ASPIRIN EC 81 MG PO TBEC: 81.0000 mg | DELAYED_RELEASE_TABLET | Freq: Every day | ORAL | Status: DC

## 2010-08-02 NOTE — Patient Instructions (Signed)
Your physician recommends that you schedule a follow-up appointment in: 3 weeks  Start Aspirin 81 mg every third day so on Tuesday, Thursday, and Saturday.

## 2010-08-03 NOTE — Consult Note (Addendum)
NAMESTARLA, DELLER                ACCOUNT NO.:  [id number]  MEDICAL RECORD NO.:  [id number]  LOCATION:  1234                         FACILITY:  [facility]  PHYSICIAN:  Hillis Range, MD       DATE OF BIRTH:  1945/01/25  DATE OF CONSULTATION: DATE OF DISCHARGE:                                CONSULTATION   PRIMARY CARE PHYSICIAN:  Dr. Jonny Ruiz.  PRIMARY CARDIOLOGIST:  Dr. Riley Kill  CHIEF COMPLAINT:  Chest pain in the setting of a lower GI bleed on FEN.  HISTORY OF PRESENT ILLNESS:  Ms. Toothaker is a 65 year old female with known coronary artery disease.  She had painless bloody diarrhea that started last p.m.[name]  She feels that she has had more than 10 episodes. She came to the emergency room where she was admitted with acute blood loss anemia.  She became hypotensive and is being transfused.  After she became hypotensive, she developed chest pain.  The chest pain was substernal and she describes it as a soreness.  Her previous anginal pain was a tightness.  She does not rate the pain.  After 1 unit of blood and some IV fluids, her chest pain resolved.  Her heart rate has also decreased and her blood pressure has improved.  This is the first episode of chest pain she has had since discharge.  PAST MEDICAL HISTORY: 1. Status post PTCA with an integrity bare-metal stent to the     circumflex in April 2012 with a periprocedural MI. 2. Mild left ventricular dysfunction with an EF of 50% and wall motion     abnormalities on ventriculogram. 3. Residual coronary artery disease between 70% and 80% in the     diagonal, AV circumflex and RCA. 4. Diabetes. 5. Hypertension. 6. Hyperlipidemia. 7. History of tobacco use, quit recently. 8. Remote history of intervention to the circumflex, RCA and LAD in     the past. 9. Obesity. 10.Hypothyroidism. 11.Anxiety/depression. 12.Family history of coronary artery disease. 13.Degenerative joint disease.  SURGICAL HISTORY:  She is status post cardiac  catheterizations as well as hysterectomy, back surgery, parathyroidectomy and thyroidectomy, plus cholecystectomy.  ALLERGIES:  She is allergic or intolerant to PROZAC, MICONAZOLE, DOXYCYCLINE, CODEINE and TEQUIN  CURRENT MEDICATIONS: 1. Cipro IV 2. Sliding scale insulin. 3. Flagyl IV 4. Protonix 40 mg IV daily. 5. Normal saline at 125 cc an hour.  SOCIAL HISTORY:  She lives in [place] with her daughter.  She is retired from [employer] working in [employer]. She quit tobacco in April 2012 with approximately 35-pack year history and denies alcohol or drug abuse.  FAMILY HISTORY:  Mother died at 47 after cardiac arrest during surgery. Her father died at 20 with an MI but no siblings have cardiac issues.  REVIEW OF SYSTEMS:  She has had melena as well as hematochezia and bright red blood per rectum within the last 12 hours.  She has chronic arthralgias and joint pains.  She has problems with anxiety.  She had the chest pain described above.  She has some chronic dyspnea on exertion.  She feels weak.  She has not had fevers or chills.  Full 14- point  review of systems is otherwise negative except as stated in the HPI.  PHYSICAL EXAMINATION:  VITAL SIGNS:  Temperature is 97.8, blood pressure 95/62, pulse 92, respiratory rate 18, O2 saturation 97% on room air. GENERAL:  She is a well-developed, African-American female, in mild distress. HEENT:  Normal. NECK:  There is no lymphadenopathy, thyromegaly, bruit or JVD noted. CV:  Her heart is regular in rate and rhythm with an S1 and S2 and no significant murmur, rub or gallop is noted.  Distal pulses are intact in all 4 extremities. LUNGS:  She has a few rales in the bases. SKIN:  No rashes or lesions are noted. ABDOMEN:  Soft and diffusely tender and bowel sounds are present but decreased. EXTREMITIES:  There is no cyanosis, clubbing or edema noted. MUSCULOSKELETAL:  There is no joint deformity or  effusions and no spine or CVA tenderness. NEURO:  She is alert and oriented.  Cranial nerves II through XII grossly intact.  RADIOLOGY:  CT of the abdomen shows thickening and inflammatory changes in the bowel, possibly secondary to colitis which could be infectious or inflammatory.  Diverticula are also seen and a 4.1 x 4.1 AAA.  EKG, sinus rhythm, rate 90, with no acute ischemic changes.  LABORATORY VALUES:  Hemoglobin 8.9, hematocrit 26.9, WBC 12.9, platelets 242.  Sodium 138, potassium 3.7, chloride of 109, CO2 of 22, BUN 11, creatinine 0.68, glucose 138, total protein 5.5, albumin 2.3, calcium 7.5.  IMPRESSION:  Ms. Collymore was seen today by Dr. Johney Frame, the patient evaluated and the data reviewed.  She is a pleasant 65 year old female with minimal 2-vessel coronary artery disease who had percutaneous intervention to the circumflex on April 29, 2010, with a bare-metal stent.  She was admitted with an active GI bleed and had chest pain in this setting.  She has profound anemia.  She had no EKG changes and her chest pain has resolved with transfusion.  RECOMMENDATIONS: 1. DC Effient. 2. Restart aspirin when okay with GI 3. Restart beta-blocker when the bleeding is treated and her blood     pressure is improved. 4. No further cardiovascular evaluation is planned at this time. 5. Treatment of her anemia with packed red blood cells and treatment     of the bleeding will be left to the primary team who has consulted     GI.     Theodore Demark, PA-C   ______________________________ Hillis Range, MD    RB/MEDQ  D:  07/08/2010  T:  07/08/2010  Job:  [id number]  Electronically Signed by Theodore Demark PA-C on 08/03/2010 04:14:10 PM Electronically Signed by Hillis Range MD on 08/09/2010 09:59:08 AM

## 2010-08-15 NOTE — Assessment & Plan Note (Addendum)
She has some recurrent symptoms, but I am not sure of the cause.  She does not have ECG change, and does have a BMS recently placed.  She is not excited about repeat cath, and so she would like to wait it out a week, and will come back and see [place].  We will follow her closely, and she knows to come with increased symptoms.

## 2010-08-15 NOTE — Progress Notes (Signed)
HPI:  She is in for follow up.  She was admitted with a significant GI bleed, and had an abnormal scan which we reviewed today.  She had had some beans, then developed bloody diarrhea.  CT suggested an infectious or inflammatory colitis, and she required transfusion.  She had presented previously with an ACS, and given her diabetes and findings was started on Effient in combination with ASA.  Her meds have been stopped now.  Fortunately, at the time we were concerned about med compliance with DAPT, and non DES platform was placed.  We are out more than a month from the procedure.  She is improved, but having some chest discomfort.  The cause of this is unknown, but she does not want to purse repeat cath at this point in time.  The discomfort is not too distant from what she had before.    Current Outpatient Prescriptions  Medication Sig Dispense Refill  . atenolol (TENORMIN) 25 MG tablet Take 25 mg by mouth daily.       . Diazepam (VALIUM PO) Take 1 tablet by mouth as needed.        . ferrous sulfate 325 (65 FE) MG EC tablet Take 325 mg by mouth 2 (two) times daily.        . fexofenadine (ALLEGRA) 180 MG tablet Take 1 tablet (180 mg total) by mouth daily as needed.  90 tablet  3  . levothyroxine (SYNTHROID, LEVOTHROID) 150 MCG tablet Take 150 mcg by mouth daily.        . metFORMIN (GLUCOPHAGE-XR) 500 MG 24 hr tablet 500 mg. 2 tablets AM and 1 tablet PM      . nitroGLYCERIN (NITROSTAT) 0.4 MG SL tablet Place 0.4 mg under the tongue every 5 (five) minutes as needed.        . rosuvastatin (CRESTOR) 20 MG tablet Take 1 tablet (20 mg total) by mouth daily.  90 tablet  3  . aspirin EC 81 MG tablet Take 1 tablet (81 mg total) by mouth daily.  150 tablet  2  . predniSONE (DELTASONE) 20 MG tablet Take 20 mg by mouth daily.          Allergies  Allergen Reactions  . Doxycycline     REACTION: gi upset  . Miconazole Nitrate     REACTION: hives  . Prilosec (Omeprazole) Other (See Comments)    Chest pain     Past Medical History  Diagnosis Date  . Coronary artery disease     PCI of the left circumflex and LAD  . Hyperlipidemia   . Hypertension   . Obesity, unspecified     MODERATE  . Thyroid disease     HYPOTHYROIDISM  . Diabetes mellitus     TYPE II  . Anxiety state, unspecified   . Depressive disorder, not elsewhere classified   . Unspecified menopausal and postmenopausal disorder   . Simple or unspecified chronic serous otitis media   . Degeneration of lumbar or lumbosacral intervertebral disc   . Allergic rhinitis, cause unspecified   . Hx of hysterectomy   . Lower GI bleed 07/23/2010    Past Surgical History  Procedure Date  . Total abdominal hysterectomy   . Lumbar fusion 01/2007    DR. Noel Gerold...3-LEVEL WITH FIXATION  . Parathyroidectomy   . Thyroidectomy   . Cardiac catheterization     PCI OF BOTH THE CIRCUMFLEX AND LEFT ANTERIOR DESCENDING ARTERY  . Cesarean section   . Heart stent 04-2010  X1    Family History  Problem Relation Age of Onset  . Heart attack Mother 48    s/p D&C-CARDIAC ARREST 1966  . Heart attack Father 37    1978 WITH MI  . Colon cancer Neg Hx     History   Social History  . Marital Status: Divorced    Spouse Name: N/A    Number of Children: 3  . Years of Education: N/A   Occupational History  . 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     Social History Main Topics  . Smoking status: Former Smoker    Types: Cigarettes  . Smokeless tobacco: Never Used  . Alcohol Use: No  . Drug Use: No  . Sexually Active: Not on file   Other Topics Concern  . Not on file   Social History Narrative   DIVORCED3 CHILDRENPATIENT SIGNED A DESIGNATED PARTY RELEASE TO ALLOW HER DAUGHTER, TRAMAINE Mccallum, TO HAVE ACCESS TO HER MEDICAL RECORDS/INFORMATION. Daphane Shepherd, May 04, 2009 @ 3:27 PMSmokes cigarettes on rare occasions.    ROS: Please see the HPI.  All other systems reviewed and negative.  PHYSICAL EXAM:  BP 123/83  Pulse  77  Resp 18  Ht 5\' 6"  (1.676 m)  Wt 225 lb 12.8 oz (102.422 kg)  BMI 36.44 kg/m2  General: Well developed, well nourished, in no acute distress. Head:  Normocephalic and atraumatic. Neck: no JVD Lungs: Clear to auscultation and percussion. Heart: Normal S1 and S2.  No murmur, rubs or gallops.  Abdomen:  Normal bowel sounds; soft; non tender; no organomegaly.  No current tenderness.  Pulses: Pulses normal in all 4 extremities. Extremities: No clubbing or cyanosis. No edema. Neurologic: Alert and oriented x 3.  EKG:  NSR. LAD.  Otherwise normal.   ASSESSMENT AND PLAN:

## 2010-08-15 NOTE — Assessment & Plan Note (Signed)
Now taking her Crestor, and understands the need if she is to avoid recurrent ischemic episodes.

## 2010-08-15 NOTE — Assessment & Plan Note (Signed)
Seems improved without recurrence at this point.  Will need to follow CBC closely. Will recheck today.

## 2010-08-17 ENCOUNTER — Encounter: Payer: Self-pay | Admitting: Cardiology

## 2010-08-25 ENCOUNTER — Ambulatory Visit: Payer: Medicare Other | Admitting: Cardiology

## 2010-09-01 ENCOUNTER — Ambulatory Visit: Payer: Medicare Other | Admitting: Cardiology

## 2010-09-07 NOTE — Progress Notes (Signed)
HPI:  Since last weeks visit, she is much improved and feeling a lot better.  Not really any more chest pain.  She is now laying off of the fried foods.  Overall she is stable.  Her Hgb is now back up to about normal, and she is not taking.  She was prescribed some Prednisone but she did not get it filled, and is not taking it.  She feels pretty good at this point in time.    Current Outpatient Prescriptions  Medication Sig Dispense Refill  . atenolol (TENORMIN) 25 MG tablet Take 25 mg by mouth daily.       . Diazepam (VALIUM PO) Take 1 tablet by mouth as needed.        . ferrous sulfate 325 (65 FE) MG EC tablet Take 325 mg by mouth 2 (two) times daily.        . fexofenadine (ALLEGRA) 180 MG tablet Take 1 tablet (180 mg total) by mouth daily as needed.  90 tablet  3  . levothyroxine (SYNTHROID, LEVOTHROID) 150 MCG tablet Take 150 mcg by mouth daily.        . metFORMIN (GLUCOPHAGE-XR) 500 MG 24 hr tablet 500 mg. 2 tablets AM and 1 tablet PM      . nitroGLYCERIN (NITROSTAT) 0.4 MG SL tablet Place 0.4 mg under the tongue every 5 (five) minutes as needed.        . rosuvastatin (CRESTOR) 20 MG tablet Take 1 tablet (20 mg total) by mouth daily.  90 tablet  3  . aspirin EC 81 MG tablet Take 1 tablet (81 mg total) by mouth daily.  150 tablet  2  . predniSONE (DELTASONE) 20 MG tablet Take 20 mg by mouth daily.          Allergies  Allergen Reactions  . Doxycycline     REACTION: gi upset  . Miconazole Nitrate     REACTION: hives  . Prilosec (Omeprazole) Other (See Comments)    Chest pain    Past Medical History  Diagnosis Date  . Coronary artery disease     PCI of the left circumflex and LAD  . Hyperlipidemia   . Hypertension   . Obesity, unspecified     MODERATE  . Thyroid disease     HYPOTHYROIDISM  . Diabetes mellitus     TYPE II  . Anxiety state, unspecified   . Depressive disorder, not elsewhere classified   . Unspecified menopausal and postmenopausal disorder   . Simple or  unspecified chronic serous otitis media   . Degeneration of lumbar or lumbosacral intervertebral disc   . Allergic rhinitis, cause unspecified   . Hx of hysterectomy   . Lower GI bleed 07/23/2010    Past Surgical History  Procedure Date  . Total abdominal hysterectomy   . Lumbar fusion 01/2007    DR. Noel Gerold...3-LEVEL WITH FIXATION  . Parathyroidectomy   . Thyroidectomy   . Cardiac catheterization     PCI OF BOTH THE CIRCUMFLEX AND LEFT ANTERIOR DESCENDING ARTERY  . Cesarean section   . Heart stent 04-2010    X1    Family History  Problem Relation Age of Onset  . Heart attack Mother 25    s/p D&C-CARDIAC ARREST 1966  . Heart attack Father 33    1978 WITH MI  . Colon cancer Neg Hx     History   Social History  . Marital Status: Divorced    Spouse Name: N/A    Number of  Children: 3  . Years of Education: N/A   Occupational History  . 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     Social History Main Topics  . Smoking status: Former Smoker    Types: Cigarettes  . Smokeless tobacco: Never Used  . Alcohol Use: No  . Drug Use: No  . Sexually Active: Not on file   Other Topics Concern  . Not on file   Social History Narrative   DIVORCED3 CHILDRENPATIENT SIGNED A DESIGNATED PARTY RELEASE TO ALLOW HER DAUGHTER, TRAMAINE Laconte, TO HAVE ACCESS TO HER MEDICAL RECORDS/INFORMATION. Daphane Shepherd, May 04, 2009 @ 3:27 PMSmokes cigarettes on rare occasions.    ROS: Please see the HPI.  All other systems reviewed and negative.  PHYSICAL EXAM:  BP 104/75  Pulse 77  Resp 18  Ht 5\' 6"  (1.676 m)  Wt 226 lb (102.513 kg)  BMI 36.48 kg/m2  General: Well developed, well nourished, in no acute distress. Head:  Normocephalic and atraumatic. Neck: no JVD Lungs: Clear to auscultation and percussion. Heart: Normal S1 and S2.  No murmur, rubs or gallops.  Abdomen:  Normal bowel sounds; soft; non tender; no organomegaly Pulses: Pulses normal in all 4  extremities. Extremities: No clubbing or cyanosis. No edema. Neurologic: Alert and oriented x 3.  EKG:  NSR.  Left axis deviation.    ASSESSMENT AND PLAN:

## 2010-09-07 NOTE — Assessment & Plan Note (Signed)
Finally has resumed taking her Crestor.

## 2010-09-07 NOTE — Assessment & Plan Note (Signed)
Has been down since GI bleed.  Stable.

## 2010-09-07 NOTE — Assessment & Plan Note (Signed)
The patient is stable. Continue medical therapy.  A non DES was placed in part because of concerns about medication compliance.  Her symptoms have improved.  Will resume ASA 81 mg qod.

## 2010-09-07 NOTE — Assessment & Plan Note (Signed)
Hemoglobin is now up, and she has stopped taking the ferrous sulfate at this point.

## 2010-09-07 NOTE — Assessment & Plan Note (Signed)
Monitoring

## 2010-09-15 ENCOUNTER — Ambulatory Visit (INDEPENDENT_AMBULATORY_CARE_PROVIDER_SITE_OTHER): Payer: Medicare Other | Admitting: Cardiology

## 2010-09-15 ENCOUNTER — Encounter: Payer: Self-pay | Admitting: Cardiology

## 2010-09-15 VITALS — BP 118/78 | HR 78 | Ht 66.0 in | Wt 224.0 lb

## 2010-09-15 DIAGNOSIS — I2510 Atherosclerotic heart disease of native coronary artery without angina pectoris: Secondary | ICD-10-CM

## 2010-09-15 NOTE — Progress Notes (Signed)
PT RESCHEDULED APP NO CHARGE ./CY

## 2010-09-16 ENCOUNTER — Encounter: Payer: Self-pay | Admitting: Internal Medicine

## 2010-09-16 ENCOUNTER — Ambulatory Visit (INDEPENDENT_AMBULATORY_CARE_PROVIDER_SITE_OTHER): Payer: Medicare Other | Admitting: Internal Medicine

## 2010-09-16 ENCOUNTER — Other Ambulatory Visit (INDEPENDENT_AMBULATORY_CARE_PROVIDER_SITE_OTHER): Payer: Medicare Other

## 2010-09-16 VITALS — BP 102/60 | HR 77 | Temp 98.9°F | Ht 66.0 in | Wt 224.4 lb

## 2010-09-16 DIAGNOSIS — R1012 Left upper quadrant pain: Secondary | ICD-10-CM

## 2010-09-16 DIAGNOSIS — F411 Generalized anxiety disorder: Secondary | ICD-10-CM

## 2010-09-16 DIAGNOSIS — E119 Type 2 diabetes mellitus without complications: Secondary | ICD-10-CM

## 2010-09-16 DIAGNOSIS — I10 Essential (primary) hypertension: Secondary | ICD-10-CM

## 2010-09-16 LAB — HEPATIC FUNCTION PANEL
Bilirubin, Direct: 0.1 mg/dL (ref 0.0–0.3)
Total Bilirubin: 0.6 mg/dL (ref 0.3–1.2)
Total Protein: 8.5 g/dL — ABNORMAL HIGH (ref 6.0–8.3)

## 2010-09-16 LAB — BASIC METABOLIC PANEL
Calcium: 9.4 mg/dL (ref 8.4–10.5)
Creatinine, Ser: 0.9 mg/dL (ref 0.4–1.2)
GFR: 76.84 mL/min (ref >60.00)

## 2010-09-16 LAB — CBC WITH DIFFERENTIAL/PLATELET
Basophils Relative: 0.4 % (ref 0.0–3.0)
Eosinophils Absolute: 0.1 10*3/uL (ref 0.0–0.7)
HCT: 43.6 % (ref 36.0–46.0)
Hemoglobin: 14.1 g/dL (ref 12.0–15.0)
Lymphocytes Relative: 22.9 % (ref 12.0–46.0)
MCHC: 32.3 g/dL (ref 30.0–36.0)
MCV: 83.6 fl (ref 78.0–100.0)
Neutro Abs: 8.2 10*3/uL — ABNORMAL HIGH (ref 1.4–7.7)
RBC: 5.21 Mil/uL — ABNORMAL HIGH (ref 3.87–5.11)

## 2010-09-16 LAB — LIPASE: Lipase: 29 U/L (ref 11.0–59.0)

## 2010-09-16 LAB — URINALYSIS, ROUTINE W REFLEX MICROSCOPIC
Ketones, ur: NEGATIVE
Specific Gravity, Urine: 1.015 (ref 1.000–1.030)
Urine Glucose: NEGATIVE
Urobilinogen, UA: 0.2 (ref 0.0–1.0)

## 2010-09-16 LAB — HEMOGLOBIN A1C: Hgb A1c MFr Bld: 6.5 % (ref 4.6–6.5)

## 2010-09-16 MED ORDER — CEFTRIAXONE SODIUM 1 G IJ SOLR
500.0000 mg | Freq: Once | INTRAMUSCULAR | Status: AC
  Administered 2010-09-16: 500 mg via INTRAMUSCULAR

## 2010-09-16 MED ORDER — OXYCODONE-ACETAMINOPHEN 5-325 MG PO TABS: 1.0000 | ORAL_TABLET | ORAL | Status: DC | PRN

## 2010-09-16 MED ORDER — METRONIDAZOLE 500 MG PO TABS: 500.0000 mg | ORAL_TABLET | Freq: Three times a day (TID) | ORAL | Status: DC

## 2010-09-16 MED ORDER — PROMETHAZINE HCL 25 MG/ML IJ SOLN
25.0000 mg | Freq: Once | INTRAMUSCULAR | Status: AC
  Administered 2010-09-16: 25 mg via INTRAMUSCULAR

## 2010-09-16 MED ORDER — CIPROFLOXACIN HCL 500 MG PO TABS: 500.0000 mg | ORAL_TABLET | Freq: Two times a day (BID) | ORAL | Status: DC

## 2010-09-16 MED ORDER — MEPERIDINE HCL 25 MG/ML IJ SOLN
25.0000 mg | Freq: Once | INTRAMUSCULAR | Status: AC
  Administered 2010-09-16: 25 mg via INTRAMUSCULAR

## 2010-09-16 NOTE — Assessment & Plan Note (Signed)
stable overall by hx and exam, most recent data reviewed with pt, and pt to continue medical treatment as before  BP Readings from Last 3 Encounters:  09/16/10 102/60  09/15/10 118/78  08/02/10 104/75

## 2010-09-16 NOTE — Assessment & Plan Note (Signed)
stable overall by hx and exam, most recent data reviewed with pt, and pt to continue medical treatment as before  Lab Results  Component Value Date   HGBA1C 6.5 09/16/2010

## 2010-09-16 NOTE — Assessment & Plan Note (Signed)
?   Diverticulitis vs ischemic colitis recurrent (but no BRBPR), doubt constipation, renal stone, pyelonephritis;  For rocephin IM 1 gm now, demerol/phenergan 25/25 IM now,  Oxycodone prn for home, and cipro/flagyl empiric;  For lab eval and CT abd/pelvis as per emr

## 2010-09-16 NOTE — Patient Instructions (Signed)
You had the antibiotic shot today (rocephin), and the pain shot (demerol/phenergan) Take all new medications as prescribed - the pain medication (given hardcopy), and the 2 antibiotics (sent to [facility]) Please go to LAB in the Basement for the blood and/or urine tests to be done today You will be contacted regarding the referral for: CT scan abdomen - please see the PCC's today before leaving Continue all other medications as before, except you can hold off on taking the metformin for now, and for at least 3 days after the CT scan (and only re-start if you are doing better with eating)

## 2010-09-16 NOTE — Progress Notes (Signed)
HPI:  [name] left without being seen.  She did not want to wait.  Goff will be submitted.  ECG reviewed.  No acute changes.  Current Outpatient Prescriptions  Medication Sig Dispense Refill  . aspirin EC 81 MG tablet Take 1 tablet (81 mg total) by mouth daily.  150 tablet  2  . atenolol (TENORMIN) 25 MG tablet Take 25 mg by mouth daily.       . Diazepam (VALIUM PO) Take 1 tablet by mouth as needed.        . ferrous sulfate 325 (65 FE) MG EC tablet Take 325 mg by mouth 2 (two) times daily.        . fexofenadine (ALLEGRA) 180 MG tablet Take 1 tablet (180 mg total) by mouth daily as needed.  90 tablet  3  . levothyroxine (SYNTHROID, LEVOTHROID) 150 MCG tablet Take 150 mcg by mouth daily.        . metFORMIN (GLUCOPHAGE-XR) 500 MG 24 hr tablet 500 mg. 2 tablets AM and 1 tablet PM      . nitroGLYCERIN (NITROSTAT) 0.4 MG SL tablet Place 0.4 mg under the tongue every 5 (five) minutes as needed.        . rosuvastatin (CRESTOR) 20 MG tablet Take 1 tablet (20 mg total) by mouth daily.  90 tablet  3  . ciprofloxacin (CIPRO) 500 MG tablet Take 1 tablet (500 mg total) by mouth 2 (two) times daily.  20 tablet  0  . metroNIDAZOLE (FLAGYL) 500 MG tablet Take 1 tablet (500 mg total) by mouth 3 (three) times daily.  30 tablet  0  . oxyCODONE-acetaminophen (PERCOCET) 5-325 MG per tablet Take 1 tablet by mouth every 4 (four) hours as needed for pain.  40 tablet  0   Current Facility-Administered Medications  Medication Dose Route Frequency Provider Last Rate Last Dose  . cefTRIAXone (ROCEPHIN) injection 500 mg  500 mg Intramuscular Once Oliver Barre, MD   500 mg at 09/16/10 1653  . meperidine (DEMEROL) injection 25 mg  25 mg Intramuscular Once Oliver Barre, MD   25 mg at 09/16/10 1654  . promethazine (PHENERGAN) injection 25 mg  25 mg Intramuscular Once Oliver Barre, MD   25 mg at 09/16/10 1655    Allergies  Allergen Reactions  . Doxycycline     REACTION: gi upset  . Miconazole Nitrate     REACTION: hives  .  Prilosec (Omeprazole) Other (See Comments)    Chest pain    Past Medical History  Diagnosis Date  . Coronary artery disease     PCI of the left circumflex and LAD  . Hyperlipidemia   . Hypertension   . Obesity, unspecified     MODERATE  . Thyroid disease     HYPOTHYROIDISM  . Diabetes mellitus     TYPE II  . Anxiety state, unspecified   . Depressive disorder, not elsewhere classified   . Unspecified menopausal and postmenopausal disorder   . Simple or unspecified chronic serous otitis media   . Degeneration of lumbar or lumbosacral intervertebral disc   . Allergic rhinitis, cause unspecified   . Hx of hysterectomy   . Lower GI bleed 07/23/2010    Past Surgical History  Procedure Date  . Total abdominal hysterectomy   . Lumbar fusion 01/2007    DR. Noel Gerold...3-LEVEL WITH FIXATION  . Parathyroidectomy   . Thyroidectomy   . Cardiac catheterization     PCI OF BOTH THE CIRCUMFLEX AND LEFT ANTERIOR DESCENDING ARTERY  .  Cesarean section   . Heart stent 04-2010    X1    Family History  Problem Relation Age of Onset  . Heart attack Mother 61    s/p D&C-CARDIAC ARREST 1966  . Heart attack Father 63    1978 WITH MI  . Colon cancer Neg Hx     History   Social History  . Marital Status: Divorced    Spouse Name: N/A    Number of Children: 3  . Years of Education: N/A   Occupational History  . 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     Social History Main Topics  . Smoking status: Former Smoker    Types: Cigarettes  . Smokeless tobacco: Never Used  . Alcohol Use: No  . Drug Use: No  . Sexually Active: Not on file   Other Topics Concern  . Not on file   Social History Narrative   DIVORCED3 CHILDRENPATIENT SIGNED A DESIGNATED PARTY RELEASE TO ALLOW HER DAUGHTER, [name], TO HAVE ACCESS TO HER MEDICAL RECORDS/INFORMATION. Daphane Shepherd, May 04, 2009 @ 3:27 PMSmokes cigarettes on rare occasions.    ROS: Please see the HPI.  All other systems  reviewed and negative.  PHYSICAL EXAM:  BP 118/78  Pulse 78  Ht 5\' 6"  (1.676 m)  Wt 224 lb (101.606 kg)  BMI 36.15 kg/m2  EKG:  NSR.  Left axis deviation.      ASSESSMENT AND PLAN:

## 2010-09-16 NOTE — Progress Notes (Signed)
Subjective:    Patient ID: [name], female    DOB: 03/30/1945, 65 y.o.   MRN: [id number]  HPI  Here with 3 days onset left side/luq pain with nausea, and feverish without chills; pain now mod to severe;  No vomiting, dysphagia, and last BM yest am, normal for her, without constipation, blood, diarrhea;  Nothing makes better or worse such as food or position change.  Pt denies chest pain, increased sob or doe, wheezing, orthopnea, PND, increased LE swelling, palpitations, dizziness or syncope.  Pt denies new neurological symptoms such as new headache, or facial or extremity weakness or numbness   Pt denies polydipsia, polyuria, or low sugar symptoms such as weakness or confusion improved with po intake.  Pt states overall good compliance with meds, trying to follow lower cholesterol, diabetic diet, wt overall stable but little exercise however.    Denies urinary symptoms such as dysuria, frequency, urgency,or hematuria. Denies worsening depressive symptoms, suicidal ideation, or panic, though has ongoing anxiety  Past Medical History  Diagnosis Date  . Coronary artery disease     PCI of the left circumflex and LAD  . Hyperlipidemia   . Hypertension   . Obesity, unspecified     MODERATE  . Thyroid disease     HYPOTHYROIDISM  . Diabetes mellitus     TYPE II  . Anxiety state, unspecified   . Depressive disorder, not elsewhere classified   . Unspecified menopausal and postmenopausal disorder   . Simple or unspecified chronic serous otitis media   . Degeneration of lumbar or lumbosacral intervertebral disc   . Allergic rhinitis, cause unspecified   . Hx of hysterectomy   . Lower GI bleed 07/23/2010   Past Surgical History  Procedure Date  . Total abdominal hysterectomy   . Lumbar fusion 01/2007    DR. Noel Gerold...3-LEVEL WITH FIXATION  . Parathyroidectomy   . Thyroidectomy   . Cardiac catheterization     PCI OF BOTH THE CIRCUMFLEX AND LEFT ANTERIOR DESCENDING ARTERY  . Cesarean section    . Heart stent 04-2010    X1    reports that she has quit smoking. Her smoking use included Cigarettes. She has never used smokeless tobacco. She reports that she does not drink alcohol or use illicit drugs. family history includes Heart attack (age of onset:40) in her mother and Heart attack (age of onset:52) in her father.  There is no history of Colon cancer. Allergies  Allergen Reactions  . Doxycycline     REACTION: gi upset  . Miconazole Nitrate     REACTION: hives  . Prilosec (Omeprazole) Other (See Comments)    Chest pain   Current Outpatient Prescriptions on File Prior to Visit  Medication Sig Dispense Refill  . aspirin EC 81 MG tablet Take 1 tablet (81 mg total) by mouth daily.  150 tablet  2  . atenolol (TENORMIN) 25 MG tablet Take 25 mg by mouth daily.       . Diazepam (VALIUM PO) Take 1 tablet by mouth as needed.        . ferrous sulfate 325 (65 FE) MG EC tablet Take 325 mg by mouth 2 (two) times daily.        . fexofenadine (ALLEGRA) 180 MG tablet Take 1 tablet (180 mg total) by mouth daily as needed.  90 tablet  3  . levothyroxine (SYNTHROID, LEVOTHROID) 150 MCG tablet Take 150 mcg by mouth daily.        . metFORMIN (GLUCOPHAGE-XR) 500  MG 24 hr tablet 500 mg. 2 tablets AM and 1 tablet PM      . nitroGLYCERIN (NITROSTAT) 0.4 MG SL tablet Place 0.4 mg under the tongue every 5 (five) minutes as needed.        . rosuvastatin (CRESTOR) 20 MG tablet Take 1 tablet (20 mg total) by mouth daily.  90 tablet  3   No current facility-administered medications on file prior to visit.   Review of Systems Review of Systems  Constitutional: Negative for diaphoresis and unexpected weight change.  HENT: Negative for drooling and tinnitus.   Eyes: Negative for photophobia and visual disturbance.  Respiratory: Negative for choking and stridor.   Gastrointestinal: Negative for vomiting and blood in stool.  Genitourinary: Negative for hematuria and decreased urine volume.  Musculoskeletal:  Negative for gait problem.  Skin: Negative for color change and wound.  Neurological: Negative for tremors and numbness.  Psychiatric/Behavioral: Negative for decreased concentration. The patient is not hyperactive.       Objective:   Physical Exam Physical Exam  VS noted Constitutional: Pt is oriented to person, place, and time. Appears well-developed and well-nourished.  HENT:  Head: Normocephalic and atraumatic.  Right Ear: External ear normal.  Left Ear: External ear normal.  Nose: Nose normal.  Mouth/Throat: Oropharynx is clear and moist.  Eyes: Conjunctivae and EOM are normal. Pupils are equal, round, and reactive to light.  Neck: Normal range of motion. Neck supple. No JVD present. No tracheal deviation present.  Cardiovascular: Normal rate, regular rhythm, normal heart sounds and intact distal pulses.   Pulmonary/Chest: Effort normal and breath sounds normal.  Abdominal: Soft. Bowel sounds are normal. There is no tenderness. except for LUQ and left side to midaxillary line, without rash, sweling, guarding, rebound Musculoskeletal: Normal range of motion. Exhibits no edema.  Lymphadenopathy:  Has no cervical adenopathy.  Neurological: Pt is alert and oriented to person, place, and time. Pt has normal reflexes. No cranial nerve deficit.  Skin: Skin is warm and dry. No rash noted.  Psychiatric:  Has  normal mood and affect. Behavior is normal. 1+ nervous        Assessment & Plan:

## 2010-09-16 NOTE — Assessment & Plan Note (Signed)
stable overall by hx and exam, most recent data reviewed with pt, and pt to continue medical treatment as before  Lab Results  Component Value Date   WBC 11.5* 09/16/2010   HGB 14.1 09/16/2010   HCT 43.6 09/16/2010   PLT 350.0 09/16/2010   CHOL  Value: 132        ATP III CLASSIFICATION:  <200     mg/dL   Desirable  161-096  mg/dL   Borderline High  >=045    mg/dL   High        04/25/8117   TRIG 106 04/11/2010   HDL 32* 04/11/2010   ALT 35 09/16/2010   AST 32 09/16/2010   NA 141 09/16/2010   K 4.0 09/16/2010   CL 103 09/16/2010   CREATININE 0.9 09/16/2010   BUN 11 09/16/2010   CO2 28 09/16/2010   TSH 0.85 07/23/2010   INR 1.19 07/08/2010   HGBA1C 6.5 09/16/2010   MICROALBUR 2.0* 11/06/2009

## 2010-09-17 ENCOUNTER — Telehealth: Payer: Self-pay

## 2010-09-17 ENCOUNTER — Ambulatory Visit (INDEPENDENT_AMBULATORY_CARE_PROVIDER_SITE_OTHER)
Admission: RE | Admit: 2010-09-17 | Discharge: 2010-09-17 | Disposition: A | Discharge status: Home / Self Care | Payer: Medicare Other | Source: Ambulatory Visit | Attending: Internal Medicine | Admitting: Internal Medicine

## 2010-09-17 DIAGNOSIS — R1012 Left upper quadrant pain: Secondary | ICD-10-CM

## 2010-09-17 MED ORDER — IOHEXOL 300 MG/ML  SOLN
100.0000 mL | Freq: Once | INTRAMUSCULAR | Status: AC | PRN
  Administered 2010-09-17: 100 mL via INTRAVENOUS

## 2010-09-17 NOTE — Telephone Encounter (Signed)
Patient called to inform still in pain. Informed CT results negative and urine test was positive for UTI. Informed the patient MD's instructions ok to take 2 percocet every 6 hrs. The patient had not started Cirpo yet as concerned about side affects. I informed the patient per MD stated is ok to take cipro and increase the percocet to 2 by mouth every 6 hours as needed. Patient agreed to do so.

## 2010-09-21 ENCOUNTER — Other Ambulatory Visit (INDEPENDENT_AMBULATORY_CARE_PROVIDER_SITE_OTHER): Payer: Medicare Other

## 2010-09-21 ENCOUNTER — Ambulatory Visit (INDEPENDENT_AMBULATORY_CARE_PROVIDER_SITE_OTHER): Payer: Medicare Other | Admitting: Internal Medicine

## 2010-09-21 ENCOUNTER — Other Ambulatory Visit: Payer: Self-pay | Admitting: Internal Medicine

## 2010-09-21 ENCOUNTER — Encounter: Payer: Self-pay | Admitting: Internal Medicine

## 2010-09-21 VITALS — BP 110/70 | HR 96 | Temp 98.6°F | Ht 66.0 in | Wt 227.0 lb

## 2010-09-21 DIAGNOSIS — N390 Urinary tract infection, site not specified: Secondary | ICD-10-CM

## 2010-09-21 DIAGNOSIS — E119 Type 2 diabetes mellitus without complications: Secondary | ICD-10-CM

## 2010-09-21 DIAGNOSIS — I10 Essential (primary) hypertension: Secondary | ICD-10-CM

## 2010-09-21 DIAGNOSIS — R1012 Left upper quadrant pain: Secondary | ICD-10-CM

## 2010-09-21 LAB — LIPID PANEL
Cholesterol: 169 mg/dL (ref 0–200)
HDL: 37.3 mg/dL — ABNORMAL LOW (ref >39.00)
Total CHOL/HDL Ratio: 5
Triglycerides: 212 mg/dL — ABNORMAL HIGH (ref 0.0–149.0)
VLDL: 42.4 mg/dL — ABNORMAL HIGH (ref 0.0–40.0)

## 2010-09-21 MED ORDER — CEPHALEXIN 500 MG PO CAPS: 500.0000 mg | ORAL_CAPSULE | Freq: Four times a day (QID) | ORAL | Status: AC

## 2010-09-21 MED ORDER — OXYCODONE-ACETAMINOPHEN 5-325 MG PO TABS: 1.0000 | ORAL_TABLET | ORAL | Status: AC | PRN

## 2010-09-21 MED ORDER — MEPERIDINE HCL 50 MG/ML IJ SOLN
50.0000 mg | Freq: Once | INTRAMUSCULAR | Status: AC
  Administered 2010-09-21: 50 mg via INTRAMUSCULAR

## 2010-09-21 MED ORDER — CEFTRIAXONE SODIUM 1 G IJ SOLR
1.0000 g | Freq: Once | INTRAMUSCULAR | Status: AC
  Administered 2010-09-21: 1 g via INTRAMUSCULAR

## 2010-09-21 MED ORDER — PROMETHAZINE HCL 25 MG/ML IJ SOLN
25.0000 mg | Freq: Once | INTRAMUSCULAR | Status: AC
  Administered 2010-09-21: 25 mg via INTRAMUSCULAR

## 2010-09-21 NOTE — Assessment & Plan Note (Signed)
With neg CT , I d/w pt that I suspect her pain may be radicular, but she is very reluctant to consider this, is adamant this type of pain is similar to prior UTI, for demerol/phenergan 50/25 today, cont percocet prn, but I explained that if she is not signficantly improved with tx above as per UTI, she should consider MRI LS spine, and even f/u with Dr Clinton Sawyer who did her previous extensive spine surgury

## 2010-09-21 NOTE — Assessment & Plan Note (Signed)
It is possible that her UTI (pain, abnormal UA and mild elev WBC last visit) may not be well treated with the cipro and could be source of ongoing pain (though see discussion below - pt reluctant to consider lumbar as source now);  Will tx with rocephin IM, change cipro to cephalexin course, and check urine cx

## 2010-09-21 NOTE — Patient Instructions (Addendum)
You had the pain shot today (demerol/phenergan) OK to stop the flagyl (as you have), and the cipro You had the antibiotic shot today (rocephin) Please start the different antibiotic - cephalexin (not very expensive, sent already to your pharmacy) You are given the refill on the pain medication Please go to LAB in the Basement for the urine culture to be done today Please call the phone number [phone] (the [employer]Tree System) for results of testing in 2-3 days;  When calling, simply dial the number, and when prompted enter the MRN number above (the Medical Record Number) and the # key, then the message should start. This change in treatment should help if the source of the pain is truly the urinary tract infection;  If you are not signficantly improved with your level of severe pain in the next 2-3 days, please call as I think you should consider having another MRI for the lower back and followup with Dr Noel Gerold

## 2010-09-21 NOTE — Assessment & Plan Note (Signed)
stable overall by hx and exam, most recent data reviewed with pt, and pt to continue medical treatment as before  BP Readings from Last 3 Encounters:  09/21/10 110/70  09/16/10 102/60  09/15/10 118/78

## 2010-09-21 NOTE — Progress Notes (Signed)
Subjective:    Patient ID: [name], female    DOB: 1945/02/28, 65 y.o.   MRN: [id number]  HPI  Here to f/u; overall not improved, required oxycodone freq since last seen; pt somewhat evasive on asking about lumbar pain (s/p surgury) but c/o mod to severe throbbing pain persistent, constant to left side and LLQ area, without swelling, redness or worsening LE radicular pain/weakness/numbness.  No gait change or fall.  No fever, chills, and denies GU symtpoms;  Has been reluctant to take the cipro over the weekend but did take several doses including this am but sees no change in pain.   Denies urinary symptoms such as dysuria, frequency, urgency,or hematuria, n/v, or worsening flank pain.  Left pain per pt not worse with change in position, nothing seems to make better except pain med.   Recent CT abd/pelvis neg for acute,  UA with elev WBC, labs o/w unremarkable Past Medical History  Diagnosis Date  . Coronary artery disease     PCI of the left circumflex and LAD  . Hyperlipidemia   . Hypertension   . Obesity, unspecified     MODERATE  . Thyroid disease     HYPOTHYROIDISM  . Diabetes mellitus     TYPE II  . Anxiety state, unspecified   . Depressive disorder, not elsewhere classified   . Unspecified menopausal and postmenopausal disorder   . Simple or unspecified chronic serous otitis media   . Degeneration of lumbar or lumbosacral intervertebral disc   . Allergic rhinitis, cause unspecified   . Hx of hysterectomy   . Lower GI bleed 07/23/2010   Past Surgical History  Procedure Date  . Total abdominal hysterectomy   . Lumbar fusion 01/2007    DR. Noel Gerold...3-LEVEL WITH FIXATION  . Parathyroidectomy   . Thyroidectomy   . Cardiac catheterization     PCI OF BOTH THE CIRCUMFLEX AND LEFT ANTERIOR DESCENDING ARTERY  . Cesarean section   . Heart stent 04-2010    X1    reports that she has quit smoking. Her smoking use included Cigarettes. She has never used smokeless tobacco. She  reports that she does not drink alcohol or use illicit drugs. family history includes Heart attack (age of onset:40) in her mother and Heart attack (age of onset:52) in her father.  There is no history of Colon cancer. Allergies  Allergen Reactions  . Doxycycline     REACTION: gi upset  . Miconazole Nitrate     REACTION: hives  . Prilosec (Omeprazole) Other (See Comments)    Chest pain   Current Outpatient Prescriptions on File Prior to Visit  Medication Sig Dispense Refill  . aspirin EC 81 MG tablet Take 1 tablet (81 mg total) by mouth daily.  150 tablet  2  . atenolol (TENORMIN) 25 MG tablet Take 25 mg by mouth daily.       . Diazepam (VALIUM PO) Take 1 tablet by mouth as needed.        . ferrous sulfate 325 (65 FE) MG EC tablet Take 325 mg by mouth 2 (two) times daily.        . fexofenadine (ALLEGRA) 180 MG tablet Take 1 tablet (180 mg total) by mouth daily as needed.  90 tablet  3  . levothyroxine (SYNTHROID, LEVOTHROID) 150 MCG tablet Take 150 mcg by mouth daily.        . metFORMIN (GLUCOPHAGE-XR) 500 MG 24 hr tablet 500 mg. 2 tablets AM and 1 tablet PM      .  nitroGLYCERIN (NITROSTAT) 0.4 MG SL tablet Place 0.4 mg under the tongue every 5 (five) minutes as needed.        . rosuvastatin (CRESTOR) 20 MG tablet Take 1 tablet (20 mg total) by mouth daily.  90 tablet  3   No current facility-administered medications on file prior to visit.   Review of Systems Review of Systems  Constitutional: Negative for diaphoresis and unexpected weight change.  HENT: Negative for drooling and tinnitus.   Eyes: Negative for photophobia and visual disturbance.  Respiratory: Negative for choking and stridor.   Gastrointestinal: Negative for vomiting and blood in stool.  Genitourinary: Negative for hematuria and decreased urine volume.  Musculoskeletal: Negative for gait problem.  Skin: Negative for color change and wound.  Neurological: Negative for tremors and numbness.  Psychiatric/Behavioral:  Negative for decreased concentration. The patient is not hyperactive.       Objective:   Physical Exam BP 110/70  Pulse 96  Temp(Src) 98.6 F (37 C) (Oral)  Ht 5\' 6"  (1.676 m)  Wt 227 lb (102.967 kg)  BMI 36.64 kg/m2  SpO2 95% Physical Exam  VS noted, appaers in pain, winces, holding left side Constitutional: Pt appears well-developed and well-nourished.  HENT: Head: Normocephalic.  Right Ear: External ear normal.  Left Ear: External ear normal.  Eyes: Conjunctivae and EOM are normal. Pupils are equal, round, and reactive to light.  Neck: Normal range of motion. Neck supple.  Cardiovascular: Normal rate and regular rhythm.   Pulmonary/Chest: Effort normal and breath sounds normal.  Abd:  Soft, non-distended, + BS, soreness noted to left side and LLQ, no guarding or rebound Neurological: Pt is alert. No cranial nerve deficit. o/w not done indetail;  Spine nontender, no paravertebral tender/swelling, rash Skin: Skin is warm. No erythema.  Psychiatric: Pt behavior is normal. Thought content normal. 1+ nervous        Assessment & Plan:

## 2010-09-25 ENCOUNTER — Inpatient Hospital Stay (HOSPITAL_COMMUNITY)
Admission: EM | Admit: 2010-09-25 | Discharge: 2010-09-30 | DRG: 392 | Disposition: A | Discharge status: Home / Self Care | Payer: Medicare Other | Attending: Internal Medicine | Admitting: Internal Medicine

## 2010-09-25 ENCOUNTER — Encounter (HOSPITAL_COMMUNITY): Payer: Self-pay | Admitting: Radiology

## 2010-09-25 ENCOUNTER — Emergency Department (HOSPITAL_COMMUNITY): Payer: Medicare Other

## 2010-09-25 DIAGNOSIS — E119 Type 2 diabetes mellitus without complications: Secondary | ICD-10-CM | POA: Diagnosis present

## 2010-09-25 DIAGNOSIS — Z79899 Other long term (current) drug therapy: Secondary | ICD-10-CM

## 2010-09-25 DIAGNOSIS — E860 Dehydration: Secondary | ICD-10-CM | POA: Diagnosis present

## 2010-09-25 DIAGNOSIS — M545 Low back pain, unspecified: Secondary | ICD-10-CM | POA: Diagnosis present

## 2010-09-25 DIAGNOSIS — I714 Abdominal aortic aneurysm, without rupture, unspecified: Secondary | ICD-10-CM | POA: Diagnosis present

## 2010-09-25 DIAGNOSIS — I10 Essential (primary) hypertension: Secondary | ICD-10-CM | POA: Diagnosis present

## 2010-09-25 DIAGNOSIS — Z8249 Family history of ischemic heart disease and other diseases of the circulatory system: Secondary | ICD-10-CM

## 2010-09-25 DIAGNOSIS — I2510 Atherosclerotic heart disease of native coronary artery without angina pectoris: Secondary | ICD-10-CM | POA: Diagnosis present

## 2010-09-25 DIAGNOSIS — Z9861 Coronary angioplasty status: Secondary | ICD-10-CM

## 2010-09-25 DIAGNOSIS — R110 Nausea: Secondary | ICD-10-CM | POA: Diagnosis not present

## 2010-09-25 DIAGNOSIS — K5730 Diverticulosis of large intestine without perforation or abscess without bleeding: Secondary | ICD-10-CM | POA: Diagnosis present

## 2010-09-25 DIAGNOSIS — R1012 Left upper quadrant pain: Principal | ICD-10-CM | POA: Diagnosis present

## 2010-09-25 DIAGNOSIS — K7689 Other specified diseases of liver: Secondary | ICD-10-CM | POA: Diagnosis present

## 2010-09-25 DIAGNOSIS — R55 Syncope and collapse: Secondary | ICD-10-CM | POA: Diagnosis present

## 2010-09-25 DIAGNOSIS — E039 Hypothyroidism, unspecified: Secondary | ICD-10-CM | POA: Diagnosis present

## 2010-09-25 LAB — URINALYSIS, ROUTINE W REFLEX MICROSCOPIC
Ketones, ur: NEGATIVE mg/dL
Leukocytes, UA: NEGATIVE
Nitrite: NEGATIVE
Protein, ur: NEGATIVE mg/dL
pH: 5.5 (ref 5.0–8.0)

## 2010-09-25 LAB — CARDIAC PANEL(CRET KIN+CKTOT+MB+TROPI)
CK, MB: 2.2 ng/mL (ref 0.3–4.0)
Relative Index: 1.4 (ref 0.0–2.5)
Total CK: 156 U/L (ref 7–177)
Troponin I: <0.3 ng/mL (ref <0.30)

## 2010-09-25 LAB — DIFFERENTIAL
Eosinophils Absolute: 0.1 10*3/uL (ref 0.0–0.7)
Eosinophils Relative: 1 % (ref 0–5)
Lymphs Abs: 2.2 10*3/uL (ref 0.7–4.0)
Monocytes Absolute: 0.7 10*3/uL (ref 0.1–1.0)
Monocytes Relative: 8 % (ref 3–12)
Neutrophils Relative %: 69 % (ref 43–77)

## 2010-09-25 LAB — COMPREHENSIVE METABOLIC PANEL
AST: 33 U/L (ref 0–37)
CO2: 26 mEq/L (ref 19–32)
Calcium: 9.7 mg/dL (ref 8.4–10.5)
Creatinine, Ser: 0.74 mg/dL (ref 0.50–1.10)
GFR calc Af Amer: >60 mL/min (ref >60)
GFR calc non Af Amer: >60 mL/min (ref >60)

## 2010-09-25 LAB — CBC
MCH: 26.8 pg (ref 26.0–34.0)
MCHC: 32.7 g/dL (ref 30.0–36.0)
MCV: 82 fL (ref 78.0–100.0)
Platelets: 336 10*3/uL (ref 150–400)
RBC: 5.38 MIL/uL — ABNORMAL HIGH (ref 3.87–5.11)

## 2010-09-25 LAB — LACTIC ACID, PLASMA: Lactic Acid, Venous: 2.2 mmol/L (ref 0.5–2.2)

## 2010-09-25 MED ORDER — IOHEXOL 300 MG/ML  SOLN
125.0000 mL | Freq: Once | INTRAMUSCULAR | Status: AC | PRN
  Administered 2010-09-25: 125 mL via INTRAVENOUS

## 2010-09-25 NOTE — H&P (Signed)
[name], [name]                ACCOUNT NO.:  [id number]  MEDICAL RECORD NO.:  [id number]  LOCATION:  1506                         FACILITY:  [facility]  PHYSICIAN:  Celso Amy, MD   DATE OF BIRTH:  10/31/1945  DATE OF ADMISSION:  09/25/2010 DATE OF DISCHARGE:                             HISTORY & PHYSICAL   PRIMARY CARE PHYSICIAN:  Corwin Levins, MD  CHIEF COMPLAINT:  Abdominal pain.  HISTORY OF PRESENT ILLNESS:  The patient is a 65 year old African American female with a past medical history of hypertension and diverticulitis, who presented to the ER with a chief complaint of pain. History of present illness dated back to 7 days ago:  Patient started having pain on the left side of the abdomen, is nonradiating, 10/10, and feels like stabbing.  The patient says she went to her primary care and later to her OB-GYN, but the pain was persistent until she presented to the ER today.  The only relieving factor for the pain are pain medication.  No complaint of nausea, vomiting, or diarrhea.  No complaint of blood in stools.  No complaint of chest pain or shortness of breath.  No complaint of syncope.  The patient continues to pass flatus.  REVIEW OF SYSTEMS:  Negative besides the HPI.  ALLERGIES:  The patient has no known drug allergies.  SOCIAL HISTORY:  The patient is a nonsmoker and nondrinker.  Denies illegal drug use.  FAMILY HISTORY:  Mother died at the age of 65 from cardiac arrest. Father died at the age of 66 from MI.  PAST MEDICAL HISTORY:  Positive. 1. Diverticular hemorrhage. 2. Ischemic colitis. 3. Coronary artery disease, status post PCI. 4. Hypertension. 5. Diabetes mellitus type 2. 6. History of abdominal aneurysm. 7. Hepatic steatosis. 8. Hypothyroidism. 9. Liver lesions.  MEDICATIONS:  As outpatient patient is on: 1. Iron sulfate 325 mg p.o. b.i.d. 2. Atenolol 25 mg p.o. b.i.d. 3. Vicodin 5/500 one tablet p.o. q.6 hours p.r.n. 4. Metformin 1500  mg p.o. daily. 5. Nitroglycerin 0.4 mg sublingual q.5 minutes p.r.n. 6. Synthroid 150 mg p.o. daily.  PHYSICAL EXAMINATION:  VITAL SIGNS:  Blood pressure 134/82, pulse 92, respirations 16, temperature afebrile. GENERAL EXAMINATION:  Patient is awake, alert, oriented, well built, lying on the bed. HEENT:  Pupils equally reactive to light and accommodation.  Head is atraumatic, normocephalic. NECK:  Supple. RESPIRATORY:  No acute respiratory distress. CHEST:  Clear to auscultation bilaterally. CARDIOVASCULAR:  S1 and S2, regular rate and rhythm.  No murmurs appreciated. GI:  Bowel sounds are present.  Abdomen is soft and nondistended.  There is minimal tenderness in the left upper quadrant. CNS:  Cranial nerves II through XII are grossly intact.  No focal motor deficit was seen.  The patient is moving all 4 extremity.  Strength is 5/5 both upper and lower extremities. PSYCH:  Patient has normal mood and affect.  LABS:  Sodium 140, potassium 4.1, serum chloride 102, bicarb 26, BUN 6, creatinine 0.7, glucose 112.  Patient's hemoglobin is 14.4, platelets 336, WBC 9.4.  UA was negative for nitrites and leukocyte esterase.  AST 33, ALT 31, lipase 22, lactic acid 2.2.  CT abdomen  done shows no acute findings.  No evidence of bowel ischemia, colitis, or diverticulitis, but it does show diverticulosis.  No change in infrarenal abdominal aneurysm, stable at 4 cm.  No evidence of rupture.  IMPRESSION: 1. Gastrointestinal.  Patient is being admitted with abdominal pain.     Patient has a history of diverticular bleed in the past.  There are     no signs of diverticulitis on the CAT scan, but patient clinically     is presenting with symptoms for diverticulitis. 2. Coronary artery disease.  Patient has history of coronary artery     disease and a strong family history of coronary artery disease. 3. Fluid, electrolytes, and nutrition.  Patient is normokalemic,     normonatremic September 25, 2010,  but patient's specific gravity     is high, so patient seems hypovolemic. 4. Endocrine.  Patient has a history of diabetes mellitus type 2 and     hypothyroidism. 5. Deep vein thrombosis.  Patient will need deep vein thrombosis     prophylaxis. 6. Pain.  Patient will need pain control.  PLAN: 1. We will admit patient to tele. 2. We will cycle troponins. 3. We will start patient on IV Flagyl and IV Cipro.  We will consult     GI, Dr. Madilyn Fireman who has been called. 4. We will keep patient on NPO. 5. We will keep patient on IV fluids. 6. We will keep patient on pain medication and Dilaudid as well for     pain control. 7. Patient's further treatment depends how she does with this plan.     Celso Amy, MD     MB/MEDQ  D:  09/25/2010  T:  09/25/2010  Job:  [id number]  Electronically Signed by Celso Amy M.D. on 09/25/2010 11:18:11 PM

## 2010-09-26 DIAGNOSIS — R1032 Left lower quadrant pain: Secondary | ICD-10-CM

## 2010-09-26 LAB — BASIC METABOLIC PANEL
BUN: 7 mg/dL (ref 6–23)
CO2: 29 mEq/L (ref 19–32)
Calcium: 8.9 mg/dL (ref 8.4–10.5)
Chloride: 105 mEq/L (ref 96–112)
Creatinine, Ser: 0.78 mg/dL (ref 0.50–1.10)
GFR calc Af Amer: >60 mL/min (ref >60)
GFR calc non Af Amer: >60 mL/min (ref >60)
Glucose, Bld: 94 mg/dL (ref 70–99)
Potassium: 3.9 mEq/L (ref 3.5–5.1)
Sodium: 142 mEq/L (ref 135–145)

## 2010-09-26 LAB — GLUCOSE, CAPILLARY
Glucose-Capillary: 160 mg/dL — ABNORMAL HIGH (ref 70–99)
Glucose-Capillary: 96 mg/dL (ref 70–99)
Glucose-Capillary: 114 mg/dL — ABNORMAL HIGH (ref 70–99)
Glucose-Capillary: 122 mg/dL — ABNORMAL HIGH (ref 70–99)

## 2010-09-26 LAB — CARDIAC PANEL(CRET KIN+CKTOT+MB+TROPI)
CK, MB: 1.9 ng/mL (ref 0.3–4.0)
CK, MB: 1.9 ng/mL (ref 0.3–4.0)
Relative Index: 1.4 (ref 0.0–2.5)
Relative Index: 1.4 (ref 0.0–2.5)
Total CK: 134 U/L (ref 7–177)
Total CK: 140 U/L (ref 7–177)
Troponin I: <0.3 ng/mL (ref <0.30)
Troponin I: <0.3 ng/mL (ref <0.30)

## 2010-09-26 LAB — HEMOGLOBIN A1C
Hgb A1c MFr Bld: 6.3 % — ABNORMAL HIGH (ref <5.7)
Mean Plasma Glucose: 134 mg/dL — ABNORMAL HIGH (ref <117)

## 2010-09-27 DIAGNOSIS — R933 Abnormal findings on diagnostic imaging of other parts of digestive tract: Secondary | ICD-10-CM

## 2010-09-27 DIAGNOSIS — R1032 Left lower quadrant pain: Secondary | ICD-10-CM

## 2010-09-27 LAB — CBC
HCT: 39.9 % (ref 36.0–46.0)
Hemoglobin: 12.4 g/dL (ref 12.0–15.0)
MCH: 26 pg (ref 26.0–34.0)
MCHC: 31.1 g/dL (ref 30.0–36.0)
MCV: 83.6 fL (ref 78.0–100.0)
RDW: 14.7 % (ref 11.5–15.5)

## 2010-09-27 LAB — GLUCOSE, CAPILLARY
Glucose-Capillary: 126 mg/dL — ABNORMAL HIGH (ref 70–99)
Glucose-Capillary: 130 mg/dL — ABNORMAL HIGH (ref 70–99)

## 2010-09-27 LAB — POCT I-STAT, CHEM 8
BUN: 4 mg/dL — ABNORMAL LOW (ref 6–23)
Calcium, Ion: 1.1 mmol/L — ABNORMAL LOW (ref 1.12–1.32)
Chloride: 104 mEq/L (ref 96–112)
HCT: 48 % — ABNORMAL HIGH (ref 36.0–46.0)
Sodium: 142 mEq/L (ref 135–145)
TCO2: 26 mmol/L (ref 0–100)

## 2010-09-27 LAB — URINE CULTURE
Colony Count: NO GROWTH
Culture: NO GROWTH

## 2010-09-27 LAB — BASIC METABOLIC PANEL
BUN: 5 mg/dL — ABNORMAL LOW (ref 6–23)
Creatinine, Ser: 0.8 mg/dL (ref 0.50–1.10)
GFR calc Af Amer: >60 mL/min (ref >60)
GFR calc non Af Amer: >60 mL/min (ref >60)
Glucose, Bld: 156 mg/dL — ABNORMAL HIGH (ref 70–99)

## 2010-09-28 ENCOUNTER — Inpatient Hospital Stay (HOSPITAL_COMMUNITY): Payer: Medicare Other

## 2010-09-28 DIAGNOSIS — K5730 Diverticulosis of large intestine without perforation or abscess without bleeding: Secondary | ICD-10-CM

## 2010-09-28 DIAGNOSIS — R933 Abnormal findings on diagnostic imaging of other parts of digestive tract: Secondary | ICD-10-CM

## 2010-09-28 DIAGNOSIS — R1032 Left lower quadrant pain: Secondary | ICD-10-CM

## 2010-09-28 LAB — GLUCOSE, CAPILLARY
Glucose-Capillary: 105 mg/dL — ABNORMAL HIGH (ref 70–99)
Glucose-Capillary: 118 mg/dL — ABNORMAL HIGH (ref 70–99)
Glucose-Capillary: 121 mg/dL — ABNORMAL HIGH (ref 70–99)

## 2010-09-29 DIAGNOSIS — R9389 Abnormal findings on diagnostic imaging of other specified body structures: Secondary | ICD-10-CM

## 2010-09-29 DIAGNOSIS — R1012 Left upper quadrant pain: Secondary | ICD-10-CM

## 2010-09-29 LAB — GLUCOSE, CAPILLARY
Glucose-Capillary: 141 mg/dL — ABNORMAL HIGH (ref 70–99)
Glucose-Capillary: 159 mg/dL — ABNORMAL HIGH (ref 70–99)

## 2010-09-30 LAB — GLUCOSE, CAPILLARY
Glucose-Capillary: 107 mg/dL — ABNORMAL HIGH (ref 70–99)
Glucose-Capillary: 110 mg/dL — ABNORMAL HIGH (ref 70–99)

## 2010-10-02 LAB — CULTURE, BLOOD (ROUTINE X 2)
Culture  Setup Time: 201209090315
Culture  Setup Time: 201209090315
Culture: NO GROWTH
Culture: NO GROWTH

## 2010-10-04 ENCOUNTER — Encounter: Payer: Self-pay | Admitting: Internal Medicine

## 2010-10-04 ENCOUNTER — Ambulatory Visit (INDEPENDENT_AMBULATORY_CARE_PROVIDER_SITE_OTHER): Payer: Medicare Other | Admitting: Internal Medicine

## 2010-10-04 VITALS — BP 100/62 | HR 102 | Temp 97.9°F | Ht 66.0 in | Wt 225.1 lb

## 2010-10-04 DIAGNOSIS — M545 Low back pain, unspecified: Secondary | ICD-10-CM | POA: Insufficient documentation

## 2010-10-04 DIAGNOSIS — I10 Essential (primary) hypertension: Secondary | ICD-10-CM

## 2010-10-04 DIAGNOSIS — G8929 Other chronic pain: Secondary | ICD-10-CM

## 2010-10-04 DIAGNOSIS — R1012 Left upper quadrant pain: Secondary | ICD-10-CM

## 2010-10-04 MED ORDER — HYDROCODONE-ACETAMINOPHEN 10-325 MG PO TABS: 1.0000 | ORAL_TABLET | Freq: Four times a day (QID) | ORAL | Status: DC | PRN

## 2010-10-04 MED ORDER — HYOSCYAMINE SULFATE 0.125 MG PO TABS: 0.1250 mg | ORAL_TABLET | ORAL | Status: AC | PRN

## 2010-10-04 NOTE — Patient Instructions (Signed)
Take all new medications as prescribed - the medication for bowel cramping Continue all other medications as before Please keep your appointments with your specialists as you have planned - Dr Arlyce Dice

## 2010-10-04 NOTE — Assessment & Plan Note (Signed)
stable overall by hx and exam, most recent data reviewed with pt, and pt to continue medical treatment as before ble  BP Readings from Last 3 Encounters:  10/04/10 100/62  09/21/10 110/70  09/16/10 102/60

## 2010-10-04 NOTE — Progress Notes (Signed)
Subjective:    Patient ID: [name], female    DOB: 07-27-1945, 65 y.o.   MRN: [id number]  HPI  Here [facility]p [facility] with d/c sept 13 , with the left sided pain as per last visit here, had IV pain meds/antibx and seemed to do better, labs and CT neg for acute,  LS spine neg for acute but with multifactorial multilevel spinal stenosis;  Has had some loose stools since then, and since been home has tended to recur with the IV pain meds wearing off, but may have been better after BM's.  LS spine "only hurts with the weather."  Pt did relate did have colonscopy while hospd though I currently cannot find result on echart and results not mentioned in the D/c summary.  Hosp course complicated while there with candidal vaginitis, and since has been home only new complaint has been left elbow/upper arm area superfic phlebitis type symptoms s/p IV sites. Has been rec;d to f/u with Dr Arlyce Dice, but no appt yet.  Did have recent GYN appt one prior to hospn with neg GYN evaluation. Past Medical History  Diagnosis Date  . Coronary artery disease     PCI of the left circumflex and LAD  . Hyperlipidemia   . Hypertension   . Obesity, unspecified     MODERATE  . Thyroid disease     HYPOTHYROIDISM  . Diabetes mellitus     TYPE II  . Anxiety state, unspecified   . Depressive disorder, not elsewhere classified   . Unspecified menopausal and postmenopausal disorder   . Simple or unspecified chronic serous otitis media   . Degeneration of lumbar or lumbosacral intervertebral disc   . Allergic rhinitis, cause unspecified   . Hx of hysterectomy   . Lower GI bleed 07/23/2010   Past Surgical History  Procedure Date  . Total abdominal hysterectomy   . Lumbar fusion 01/2007    DR. Noel Gerold...3-LEVEL WITH FIXATION  . Parathyroidectomy   . Thyroidectomy   . Cardiac catheterization     PCI OF BOTH THE CIRCUMFLEX AND LEFT ANTERIOR DESCENDING ARTERY  . Cesarean section   . Heart stent 04-2010    X1    reports that  she has quit smoking. Her smoking use included Cigarettes. She has never used smokeless tobacco. She reports that she does not drink alcohol or use illicit drugs. family history includes Heart attack (age of onset:40) in her mother and Heart attack (age of onset:52) in her father.  There is no history of Colon cancer. Allergies  Allergen Reactions  . Doxycycline     REACTION: gi upset  . Miconazole Nitrate     REACTION: hives  . Prilosec (Omeprazole) Other (See Comments)    Chest pain   Current Outpatient Prescriptions on File Prior to Visit  Medication Sig Dispense Refill  . aspirin EC 81 MG tablet Take 1 tablet (81 mg total) by mouth daily.  150 tablet  2  . atenolol (TENORMIN) 25 MG tablet Take 25 mg by mouth daily.       . Diazepam (VALIUM PO) Take 1 tablet by mouth as needed.        . ferrous sulfate 325 (65 FE) MG EC tablet Take 325 mg by mouth 2 (two) times daily.        . fexofenadine (ALLEGRA) 180 MG tablet Take 1 tablet (180 mg total) by mouth daily as needed.  90 tablet  3  . levothyroxine (SYNTHROID, LEVOTHROID) 150 MCG tablet Take  150 mcg by mouth daily.        . metFORMIN (GLUCOPHAGE-XR) 500 MG 24 hr tablet 500 mg. 2 tablets AM and 1 tablet PM      . nitroGLYCERIN (NITROSTAT) 0.4 MG SL tablet Place 0.4 mg under the tongue every 5 (five) minutes as needed.        . rosuvastatin (CRESTOR) 20 MG tablet Take 1 tablet (20 mg total) by mouth daily.  90 tablet  3   Review of Systems Review of Systems  Constitutional: Negative for diaphoresis and unexpected weight change.  HENT: Negative for drooling and tinnitus.   Eyes: Negative for photophobia and visual disturbance.  Respiratory: Negative for choking and stridor.   Gastrointestinal: Negative for vomiting and blood in stool.  Genitourinary: Negative for hematuria and decreased urine volume.     Objective:   Physical Exam BP 100/62  Pulse 102  Temp(Src) 97.9 F (36.6 C) (Oral)  Ht 5\' 6"  (1.676 m)  Wt 225 lb 2 oz  (102.116 kg)  BMI 36.34 kg/m2  SpO2 96% Physical Exam  VS noted Constitutional: Pt appears well-developed and well-nourished.  HENT: Head: Normocephalic.  Right Ear: External ear normal.  Left Ear: External ear normal.  Eyes: Conjunctivae and EOM are normal. Pupils are equal, round, and reactive to light.  Neck: Normal range of motion. Neck supple.  Cardiovascular: Normal rate and regular rhythm.   Pulmonary/Chest: Effort normal and breath sounds normal.  Abd:  Soft, NT, non-distended, + BS Neurological: Pt is alert. No cranial nerve deficit.  Skin: Skin is warm. No erythema. except for  2 cm area just above the medial left elbow c/w superfic phlebitis Psychiatric: Pt behavior is normal. Thought content normal. 1-2+ nervous Does have some tender to firm palpation of the left lower ant chest wall , not clear if this is source of pain    Assessment & Plan:

## 2010-10-04 NOTE — Assessment & Plan Note (Signed)
Unclear etiology - ? MSK, neg eval so far, though I dont have results of the colonscopy done recently while hospd;  With loose stool recently (o/w bening exam) cant r/o IBS though no hx in the past;  Will try levsin prn, pt plans to f/u with Dr Kaplan/GI as rec;d at recent d/c

## 2010-10-05 ENCOUNTER — Encounter: Payer: Self-pay | Admitting: Cardiology

## 2010-10-05 ENCOUNTER — Ambulatory Visit (INDEPENDENT_AMBULATORY_CARE_PROVIDER_SITE_OTHER): Payer: Medicare Other | Admitting: Cardiology

## 2010-10-05 DIAGNOSIS — E785 Hyperlipidemia, unspecified: Secondary | ICD-10-CM

## 2010-10-05 DIAGNOSIS — I10 Essential (primary) hypertension: Secondary | ICD-10-CM

## 2010-10-05 DIAGNOSIS — R1032 Left lower quadrant pain: Secondary | ICD-10-CM

## 2010-10-05 DIAGNOSIS — I2510 Atherosclerotic heart disease of native coronary artery without angina pectoris: Secondary | ICD-10-CM

## 2010-10-05 DIAGNOSIS — I714 Abdominal aortic aneurysm, without rupture, unspecified: Secondary | ICD-10-CM

## 2010-10-05 NOTE — Patient Instructions (Signed)
Your physician wants you to follow-up in: 4 MONTHS.  You will receive a reminder letter in the mail two months in advance. If you don't receive a letter, please call our office to schedule the follow-up appointment.  Your physician recommends that you continue on your current medications as directed. Please refer to the Current Medication list given to you today.  

## 2010-10-05 NOTE — Progress Notes (Signed)
HPI:  She was just discharged from the hospital.  Continues to have some left sided abdominal pain.  No chest pain.  Had workup at hospital which included CT scan, MRI of the back, and colonscopy.   Radiology reports reviewed.  Still having some symptoms in the LLQ.  No fever.  ---  Current Outpatient Prescriptions  Medication Sig Dispense Refill  . aspirin EC 81 MG tablet Take by mouth. Take 3 times a week       . atenolol (TENORMIN) 25 MG tablet Take 25 mg by mouth daily.       . Diazepam (VALIUM PO) Take 1 tablet by mouth as needed.        . fexofenadine (ALLEGRA) 180 MG tablet Take 1 tablet (180 mg total) by mouth daily as needed.  90 tablet  3  . HYDROcodone-acetaminophen (NORCO) 10-325 MG per tablet Take 1 tablet by mouth every 6 (six) hours as needed for pain.  60 tablet  1  . hyoscyamine (LEVSIN) 0.125 MG tablet Take 1 tablet (0.125 mg total) by mouth every 4 (four) hours as needed for cramping.  30 tablet  0  . levothyroxine (SYNTHROID, LEVOTHROID) 150 MCG tablet Take 150 mcg by mouth daily.        . metFORMIN (GLUCOPHAGE-XR) 500 MG 24 hr tablet 500 mg. 2 tablets AM and 1 tablet PM      . nitroGLYCERIN (NITROSTAT) 0.4 MG SL tablet Place 0.4 mg under the tongue every 5 (five) minutes as needed.        . rosuvastatin (CRESTOR) 20 MG tablet Take 1 tablet (20 mg total) by mouth daily.  90 tablet  3    Allergies  Allergen Reactions  . Doxycycline     REACTION: gi upset  . Miconazole Nitrate     REACTION: hives  . Prilosec (Omeprazole) Other (See Comments)    Chest pain    Past Medical History  Diagnosis Date  . Coronary artery disease     PCI of the left circumflex and LAD  . Hyperlipidemia   . Hypertension   . Obesity, unspecified     MODERATE  . Thyroid disease     HYPOTHYROIDISM  . Diabetes mellitus     TYPE II  . Anxiety state, unspecified   . Depressive disorder, not elsewhere classified   . Unspecified menopausal and postmenopausal disorder   . Simple or unspecified  chronic serous otitis media   . Degeneration of lumbar or lumbosacral intervertebral disc   . Allergic rhinitis, cause unspecified   . Hx of hysterectomy   . Lower GI bleed 07/23/2010  . Chronic LBP 10/04/2010    Past Surgical History  Procedure Date  . Total abdominal hysterectomy   . Lumbar fusion 01/2007    DR. Noel Gerold...3-LEVEL WITH FIXATION  . Parathyroidectomy   . Thyroidectomy   . Cardiac catheterization     PCI OF BOTH THE CIRCUMFLEX AND LEFT ANTERIOR DESCENDING ARTERY  . Cesarean section   . Heart stent 04-2010    X1    Family History  Problem Relation Age of Onset  . Heart attack Mother 44    s/p D&C-CARDIAC ARREST 1966  . Heart attack Father 59    1978 WITH MI  . Colon cancer Neg Hx     History   Social History  . Marital Status: Divorced    Spouse Name: N/A    Number of Children: 3  . Years of Education: N/A   Occupational History  .  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     Social History Main Topics  . Smoking status: Former Smoker    Types: Cigarettes  . Smokeless tobacco: Never Used  . Alcohol Use: No  . Drug Use: No  . Sexually Active: Not on file   Other Topics Concern  . Not on file   Social History Narrative   DIVORCED3 CHILDRENPATIENT SIGNED A DESIGNATED PARTY RELEASE TO ALLOW HER DAUGHTER, TRAMAINE Ozment, TO HAVE ACCESS TO HER MEDICAL RECORDS/INFORMATION. Daphane Shepherd, May 04, 2009 @ 3:27 PMSmokes cigarettes on rare occasions.    ROS: Please see the HPI.  All other systems reviewed and negative.  PHYSICAL EXAM:  BP 108/66  Pulse 69  Resp 20  Ht 5\' 6"  (1.676 m)  Wt 221 lb 1.9 oz (100.299 kg)  BMI 35.69 kg/m2  General: Well developed, well nourished, in no acute distress. Head:  Normocephalic and atraumatic. Neck: no JVD Lungs: Clear to auscultation and percussion. Heart: Normal S1 and S2.  No murmur, rubs or gallops.  Abdomen:  Normal bowel sounds; soft; question tender LLQ no rebound BS present. Pulses: Pulses  normal in all 4 extremities. Extremities: No clubbing or cyanosis. No edema. Neurologic: Alert and oriented x 3.  EKG:  NSR.  Left axis deviation.  CT Scan: Septermber 8, 2012:    IMPRESSION:   No acute finding.  No evidence of bowel ischemia, colitis or   diverticulitis.  Widespread diverticulosis remains evident.    No change in benign appearing low densities within the liver.    No change in infrarenal abdominal aortic aneurysm maximal   transverse diameter 4 cm without evidence of rupture.    Original Report Authenticated By: Thomasenia Sales, M.D.  MRI of the spine;  September 29, 2010: IMPRESSION:    1.  Status post laminectomy and PLIF from L3-S1.  At the operative   levels, there is no significant residual spinal stenosis or nerve   root encroachment.   2.  Adjacent segment disease at L2-L3 contributes to mild to   moderate multifactorial spinal stenosis.   3.  Mild multifactorial spinal stenosis at L1-L2.  A superimposed   left paracentral disc extrusion results in caudal migration of disc   material and mild mass effect on the left aspect of the thecal sac.   4.  Shallow posterolateral disc protrusion on the left at T12-L1   without conus deformity or definite nerve root encroachment.   5.  Sagittal images demonstrate disc bulging at T10-T11 and T11-T12   resulting in biforaminal stenosis at T10-T11 and left foraminal   stenosis at T11-T12.    Original Report Authenticated By: Gerrianne Scale, M.D.  Additional Information   ASSESSMENT AND PLAN:

## 2010-10-07 NOTE — Telephone Encounter (Signed)
This encounter was created in error - please disregard.

## 2010-10-12 DIAGNOSIS — R1032 Left lower quadrant pain: Secondary | ICD-10-CM | POA: Insufficient documentation

## 2010-10-12 NOTE — Assessment & Plan Note (Signed)
Appears stable from CT standpoint.  Will need to be monitored over time.

## 2010-10-12 NOTE — Assessment & Plan Note (Addendum)
Was evaluated in the hospital and admitted by general medicine.  Some of her symptoms persist.  She was just released.  Findings from imaging suggest only diverticulosis.   Not on a lot of medications at this point in time.  She needs to follow up with the GI service.  They spoke with her about diet specifics, but this seems likely to require ongoing evaluation.  She saw Dr. Juanda Chance in the hospital, so will need to follow up with her.

## 2010-10-12 NOTE — Assessment & Plan Note (Signed)
This is controlled at present.  

## 2010-10-12 NOTE — Assessment & Plan Note (Signed)
Back taking her Crestor.

## 2010-10-12 NOTE — Assessment & Plan Note (Signed)
Has not had recurrent chest pain since prior PCI.

## 2010-10-29 ENCOUNTER — Ambulatory Visit (INDEPENDENT_AMBULATORY_CARE_PROVIDER_SITE_OTHER): Payer: Medicare Other | Admitting: Gastroenterology

## 2010-10-29 ENCOUNTER — Encounter: Payer: Self-pay | Admitting: Gastroenterology

## 2010-10-29 VITALS — BP 108/70 | HR 110 | Ht 66.0 in | Wt 226.4 lb

## 2010-10-29 DIAGNOSIS — R1012 Left upper quadrant pain: Secondary | ICD-10-CM

## 2010-10-29 NOTE — Patient Instructions (Signed)
Follow up as needed

## 2010-10-29 NOTE — Assessment & Plan Note (Addendum)
Her severe abdominal pain and requiring hospitalization was likely due to musculoskeletal pain as evidence by her marked tenderness to exam in the left subcostal area over the ribs.  Recommendations #1 the patient was instructed to begin nonsteroidals if she develops similar pain

## 2010-10-29 NOTE — Progress Notes (Signed)
History of Present Illness:  [name] has returned following hospitalization last month for acute left sided abdominal pain. She underwent CT scan and colonoscopy that did not show acute findings. She was thought to have diverticulitis but this was not verified by these tests. She had a diverticular bleed in June, 2012. Since discharge she's had no further episodes of pain. She denies dysuria or urinary frequency.   Review of Systems: Pertinent positive and negative review of systems were noted in the above HPI section. All other review of systems were otherwise negative.    Current Medications, Allergies, Past Medical History, Past Surgical History, Family History and Social History were reviewed in [facility] electronic medical record  Vital signs were reviewed in today's medical record. Physical Exam: General: Well developed , well nourished, no acute distress On abdominal exam there are no dominant masses, tenderness organomegaly. There is no flank tenderness. She has marked tenderness in the left subcostal area over the 10th rib

## 2010-11-02 ENCOUNTER — Ambulatory Visit (INDEPENDENT_AMBULATORY_CARE_PROVIDER_SITE_OTHER): Payer: Medicare Other | Admitting: Internal Medicine

## 2010-11-02 ENCOUNTER — Encounter: Payer: Self-pay | Admitting: Internal Medicine

## 2010-11-02 VITALS — BP 100/62 | HR 76 | Temp 97.7°F | Ht 65.0 in | Wt 226.0 lb

## 2010-11-02 DIAGNOSIS — R1012 Left upper quadrant pain: Secondary | ICD-10-CM

## 2010-11-02 DIAGNOSIS — H6691 Otitis media, unspecified, right ear: Secondary | ICD-10-CM | POA: Insufficient documentation

## 2010-11-02 DIAGNOSIS — Z23 Encounter for immunization: Secondary | ICD-10-CM

## 2010-11-02 DIAGNOSIS — I10 Essential (primary) hypertension: Secondary | ICD-10-CM

## 2010-11-02 DIAGNOSIS — E119 Type 2 diabetes mellitus without complications: Secondary | ICD-10-CM

## 2010-11-02 DIAGNOSIS — H6690 Otitis media, unspecified, unspecified ear: Secondary | ICD-10-CM

## 2010-11-02 MED ORDER — CEPHALEXIN 500 MG PO CAPS: 500.0000 mg | ORAL_CAPSULE | Freq: Four times a day (QID) | ORAL | Status: AC

## 2010-11-02 MED ORDER — CEFTRIAXONE SODIUM 1 G IJ SOLR
1.0000 g | Freq: Once | INTRAMUSCULAR | Status: AC
  Administered 2010-11-02: 1 g via INTRAMUSCULAR

## 2010-11-02 NOTE — Progress Notes (Signed)
Subjective:    Patient ID: [name], female    DOB: 1945-09-05, 65 y.o.   MRN: [id number]  HPI  Here with over 1 wk worsening right ear pain (usually the left ear in the past) and dizziness, worse in the AM, for 3 days after visiting the greatgrandkids who have been ill as well; with fever, ST, slight cough but significant congestion, HA, general weakness and malaise as well;  Pt denies chest pain, increased sob or doe, wheezing, orthopnea, PND, increased LE swelling, palpitations, dizziness or syncope.  Pt denies new neurological symptoms such as new headache, or facial or extremity weakness or numbness   Pt denies polydipsia, polyuria, or low sugar symptoms such as weakness or confusion improved with po intake.  Pt states overall good compliance with meds, trying to follow lower cholesterol, diabetic diet, wt overall stable but little exercise however.      Pt denies fever, wt loss, night sweats, loss of appetite, or other constitutional symptoms except for the above.  Abd pain from recent evaluations has resolved.   Denies urinary symptoms such as dysuria, frequency, urgency,or hematuria. Past Medical History  Diagnosis Date  . Coronary artery disease     PCI of the left circumflex and LAD  . Hyperlipidemia   . Hypertension   . Obesity, unspecified     MODERATE  . Thyroid disease     HYPOTHYROIDISM  . Diabetes mellitus     TYPE II  . Anxiety state, unspecified   . Depressive disorder, not elsewhere classified   . Unspecified menopausal and postmenopausal disorder   . Simple or unspecified chronic serous otitis media   . Degeneration of lumbar or lumbosacral intervertebral disc   . Allergic rhinitis, cause unspecified   . Hx of hysterectomy   . Lower GI bleed 07/23/2010  . Chronic LBP 10/04/2010   Past Surgical History  Procedure Date  . Total abdominal hysterectomy   . Lumbar fusion 01/2007    DR. Noel Gerold...3-LEVEL WITH FIXATION  . Parathyroidectomy   . Thyroidectomy   . Cardiac  catheterization     PCI OF BOTH THE CIRCUMFLEX AND LEFT ANTERIOR DESCENDING ARTERY  . Cesarean section   . Heart stent 04-2010    X2    reports that she has quit smoking. Her smoking use included Cigarettes. She has never used smokeless tobacco. She reports that she does not drink alcohol or use illicit drugs. family history includes Heart attack (age of onset:40) in her mother and Heart attack (age of onset:52) in her father.  There is no history of Colon cancer. Allergies  Allergen Reactions  . Doxycycline     REACTION: gi upset  . Miconazole Nitrate     REACTION: hives  . Prilosec (Omeprazole) Other (See Comments)    Chest pain   Current Outpatient Prescriptions on File Prior to Visit  Medication Sig Dispense Refill  . aspirin EC 81 MG tablet Take by mouth. Take 3 times a week       . atenolol (TENORMIN) 25 MG tablet Take 25 mg by mouth daily.       . Diazepam (VALIUM PO) Take 1 tablet by mouth as needed.        . fexofenadine (ALLEGRA) 180 MG tablet Take 1 tablet (180 mg total) by mouth daily as needed.  90 tablet  3  . levothyroxine (SYNTHROID, LEVOTHROID) 150 MCG tablet Take 150 mcg by mouth daily.        . metFORMIN (GLUCOPHAGE-XR) 500 MG  24 hr tablet 500 mg. 2 tablets AM and 1 tablet PM      . nitroGLYCERIN (NITROSTAT) 0.4 MG SL tablet Place 0.4 mg under the tongue every 5 (five) minutes as needed.        . rosuvastatin (CRESTOR) 20 MG tablet Take 1 tablet (20 mg total) by mouth daily.  90 tablet  3   No current facility-administered medications on file prior to visit.   Review of Systems Review of Systems  Constitutional: Negative for diaphoresis and unexpected weight change.  HENT: Negative for drooling and tinnitus.   Eyes: Negative for photophobia and visual disturbance.  Respiratory: Negative for choking and stridor.   Gastrointestinal: Negative for vomiting and blood in stool.  Genitourinary: Negative for hematuria and decreased urine volume.     Objective:    Physical Exam BP 100/62  Pulse 76  Temp(Src) 97.7 F (36.5 C) (Oral)  Ht 5\' 5"  (1.651 m)  Wt 226 lb (102.513 kg)  BMI 37.61 kg/m2  SpO2 96%ve.   Physical Exam  VS noted, mld ill Constitutional: Pt appears well-developed and well-nourished.  HENT: Head: Normocephalic.  Right Ear: External ear normal.  Left Ear: External ear normal.  Bilat tm's mod to severe erythema.  Sinus tender bilat.  Pharynx mild erythema Eyes: Conjunctivae and EOM are normal. Pupils are equal, round, and reactive to light.  Neck: Normal range of motion. Neck supple.  Cardiovascular: Normal rate and regular rhythm.   Pulmonary/Chest: Effort normal and breath sounds normal.  Abd:  Soft, NT, non-distended, + BS Neurological: Pt is alert. No cranial nerve deficit.  Skin: Skin is warm. No erythema.  Psychiatric: Pt behavior is normal. Thought content normal. 1+ nervous    Assessment & Plan:

## 2010-11-02 NOTE — Discharge Summary (Signed)
NAMEYISELL, SPRUNGER                ACCOUNT NO.:  [id number]  MEDICAL RECORD NO.:  [id number]  LOCATION:  1506                         FACILITY:  [facility]  PHYSICIAN:  Kimberlyann Hollar I Trula Frede, MD      DATE OF BIRTH:  02/04/1945  DATE OF ADMISSION:  09/25/2010 DATE OF DISCHARGE:  09/30/2010                              DISCHARGE SUMMARY   PRIMARY CARE PHYSICIAN:  Corwin Levins, MD.  DISCHARGE DIAGNOSES: 1. Unexplainable left side/lumbar pain, resolved completely at this     time. 2. Hypertension. 3. Diverticulitis. 4. History of coronary artery disease. 5. History of fatty liver. 6. Type 2 diabetes mellitus. 7. History of abdominal aortic aneurysm. 8. Hypothyroidism.  CONSULTATION:  GI consulted done by Dr. Juanda Chance and also consulted.  PROCEDURE: 1. CT abdomen and pelvis, no acute finding, no evidence of bowel     ischemic, colitis or diverticulitis, wide spread diverticulosis, no     change in benign appearing low densities within the liver, no     change in infrarenal abdominal aortic aneurysm, maximal transverse     diameter 4 cm without evidence of rupture. 2. MRI of the lumbar spine, status post laminectomy from L3 to S1 at     postoperative level.  There is no significant residual spinal     stenosis or nerve root encroachment at L2-L3 to mild-to-moderate     multifactorial spinal stenosis. 3. Mild multifactorial spinal stenosis at L1 and L2 and superimposed     left paracentral disk extrusion with something caudal migration of     the disk material, multifactorial in the left aspect of the sacral     sac, showed a posterolateral disk protrusion on the left T12 to L1     without cornus middle deformities or definitive nerve encroachment.  HISTORY OF PRESENT ILLNESS:  This is a 65 year old African American female with past medical history of hypertension and diverticulitis, ischemic colitis, presented to the ED with chief complaint of pain, 10/10.  The pain mainly at the left  iliac, upper lumbar, and left side. No chest pain.  No shortness of breath.  No pleuritic chest pain.  On examination, the patient found to have vital sign, temperature 98.7, blood pressure 134/82, and pulse rate 92.  On examination, her abdomen is soft and nondistended and there is minimal tenderness in the left upper quadrant.  The patient seen the Gastroenterology before and abdominal examination was benign and Dr. Juanda Chance not recommend MRI of the back.  MRI report as above.  Dr. Ophelia Charter did see the patient and from his point he felt this pain not related to her back and he recommend to follow up with Dr. Noel Gerold, her primary Orthopedics.  Today, the patient seen, left upper quadrant was completely resolved.  There is no evidence of tenderness or pain.  Causes of the left side pain is not clear, but currently resolved.  The patient on already Vicodin, need to continue that and need to follow up with Dr. Yetta Barre as an outpatient.  During hospital stay, cardiac enzymes were cycled, which were negative, which exclude any cardiac cause for her pain and in any circumstances her pain was not  on her chest.  DISCHARGE MEDICATIONS: 1. Fluconazole 100 mg p.o. daily. 2. Aspirin 81 mg daily. 3. Norvasc 5 mg p.o. daily. 4. Hydrocodone/APAP 10/325 q.6 hour. 5. Metformin 500 two tablet in a.m., one tablet at night. 6. Synthroid 160 mcg daily. 7. Atenolol 25 mg p.o. b.i.d. was on hold secondary to lower normal     blood pressure and bradycardia and this need to address by     patient's MD.  Currently, we felt the patient is medically stable for discharge. Follow up with Dr. Yetta Barre in 1 week from the date of discharge, the patient informed.  It is pleasure to take care of [name].     Benen Weida Bosie Helper, MD     HIE/MEDQ  D:  09/30/2010  T:  09/30/2010  Job:  [id number]  Electronically Signed by Ebony Cargo MD on 11/02/2010 11:49:05 AM

## 2010-11-02 NOTE — Assessment & Plan Note (Addendum)
C/w msk, now resolved, no further eval needed,  to f/u any worsening symptoms or concerns

## 2010-11-02 NOTE — Patient Instructions (Addendum)
You had the flu shot today, and the antibiotic shot Take all new medications as prescribed - the antibiotic at your pharmacy Continue all other medications as before You can also take Delsym OTC for cough, and/or Mucinex (or it's generic off brand) for congestion Please return in 1 week for Nurse Visit  - for the pneumonia shot only (medicare does not pay for routine tetanus anymore)

## 2010-11-02 NOTE — Assessment & Plan Note (Signed)
Mod to severe, for rocephin IM per pt request, and for antibx po course,  to f/u any worsening symptoms or concerns

## 2010-11-02 NOTE — Assessment & Plan Note (Signed)
stable overall by hx and exam, most recent data reviewed with pt, and pt to continue medical treatment as before  Lab Results  Component Value Date   HGBA1C 6.3* 09/25/2010

## 2010-11-02 NOTE — Assessment & Plan Note (Signed)
stable overall by hx and exam, most recent data reviewed with pt, and pt to continue medical treatment as before  BP Readings from Last 3 Encounters:  11/02/10 100/62  10/29/10 108/70  10/05/10 108/66

## 2010-11-09 ENCOUNTER — Ambulatory Visit (INDEPENDENT_AMBULATORY_CARE_PROVIDER_SITE_OTHER): Payer: Medicare Other

## 2010-11-09 DIAGNOSIS — Z23 Encounter for immunization: Secondary | ICD-10-CM

## 2010-11-24 ENCOUNTER — Ambulatory Visit (INDEPENDENT_AMBULATORY_CARE_PROVIDER_SITE_OTHER): Payer: Medicare Other | Admitting: Cardiology

## 2010-11-24 ENCOUNTER — Encounter: Payer: Self-pay | Admitting: Cardiology

## 2010-11-24 DIAGNOSIS — I10 Essential (primary) hypertension: Secondary | ICD-10-CM

## 2010-11-24 DIAGNOSIS — K922 Gastrointestinal hemorrhage, unspecified: Secondary | ICD-10-CM

## 2010-11-24 DIAGNOSIS — I2510 Atherosclerotic heart disease of native coronary artery without angina pectoris: Secondary | ICD-10-CM

## 2010-11-24 DIAGNOSIS — R079 Chest pain, unspecified: Secondary | ICD-10-CM

## 2010-11-24 DIAGNOSIS — E785 Hyperlipidemia, unspecified: Secondary | ICD-10-CM

## 2010-11-24 LAB — CBC WITH DIFFERENTIAL/PLATELET
Basophils Absolute: 0 10*3/uL (ref 0.0–0.1)
Eosinophils Absolute: 0.1 10*3/uL (ref 0.0–0.7)
MCHC: 32.3 g/dL (ref 30.0–36.0)
MCV: 82.8 fl (ref 78.0–100.0)
Monocytes Absolute: 0.4 10*3/uL (ref 0.1–1.0)
Neutrophils Relative %: 64.8 % (ref 43.0–77.0)
Platelets: 339 10*3/uL (ref 150.0–400.0)
RDW: 17.5 % — ABNORMAL HIGH (ref 11.5–14.6)

## 2010-11-24 LAB — BASIC METABOLIC PANEL
BUN: 8 mg/dL (ref 6–23)
CO2: 28 mEq/L (ref 19–32)
Chloride: 104 mEq/L (ref 96–112)
Creatinine, Ser: 0.9 mg/dL (ref 0.4–1.2)

## 2010-11-24 MED ORDER — NITROGLYCERIN 0.4 MG SL SUBL: 0.4000 mg | SUBLINGUAL_TABLET | SUBLINGUAL | Status: DC | PRN

## 2010-11-24 NOTE — Assessment & Plan Note (Signed)
We talked about her symptoms in detail.  She is concerned about her heart, but wants to hold off on another catheterization at the present time.  It is not clear that this is cardiac, but the potential is really quite clear.  She had a BMS placed for reclosure, but there are some atypical features for sure.  We will have her take her ASA 4 times per week   (had previously major GI bleed), and add prilosec to her regimen.  Because of her bleeding, we have been reluctant to consider doing more, but we will follow her closely.

## 2010-11-24 NOTE — Patient Instructions (Addendum)
Your physician recommends that you schedule a follow-up appointment in: 2 weeks   Your physician has recommended you make the following change in your medication: start prilosec otc once dailly take aspirin 4 times a week  Your physician recommends that you have lab work today: BMP and CBC

## 2010-11-24 NOTE — Assessment & Plan Note (Signed)
Will recheck CBC at present.  Will also get BMET.

## 2010-11-24 NOTE — Progress Notes (Signed)
HPI:  She is a bit tearful today.  She sometimes gets some discomfort.  It lasts a very short time.  No diaphoresis or nausea, and occurs at night.  Does not feel like indigestion, and yet [employer] makes it go away.  She saw Dr. Turner Daniels for problems with her knee, and he wants to do a total knee.  She would like to put this off for now.    Current Outpatient Prescriptions  Medication Sig Dispense Refill  . aspirin EC 81 MG tablet Take by mouth. Take 3 times a week       . atenolol (TENORMIN) 25 MG tablet Take 25 mg by mouth daily.       . Diazepam (VALIUM PO) Take 1 tablet by mouth as needed.        . fexofenadine (ALLEGRA) 180 MG tablet Take 1 tablet (180 mg total) by mouth daily as needed.  90 tablet  3  . levothyroxine (SYNTHROID, LEVOTHROID) 150 MCG tablet Take 150 mcg by mouth daily.        . metFORMIN (GLUCOPHAGE-XR) 500 MG 24 hr tablet 500 mg. 2 tablets AM and 1 tablet PM      . nitroGLYCERIN (NITROSTAT) 0.4 MG SL tablet Place 0.4 mg under the tongue every 5 (five) minutes as needed.        . rosuvastatin (CRESTOR) 20 MG tablet Take 1 tablet (20 mg total) by mouth daily.  90 tablet  3    Allergies  Allergen Reactions  . Doxycycline     REACTION: gi upset  . Miconazole Nitrate     REACTION: hives  . Prilosec (Omeprazole) Other (See Comments)    Chest pain    Past Medical History  Diagnosis Date  . Coronary artery disease     PCI of the left circumflex and LAD  . Hyperlipidemia   . Hypertension   . Obesity, unspecified     MODERATE  . Thyroid disease     HYPOTHYROIDISM  . Diabetes mellitus     TYPE II  . Anxiety state, unspecified   . Depressive disorder, not elsewhere classified   . Unspecified menopausal and postmenopausal disorder   . Simple or unspecified chronic serous otitis media   . Degeneration of lumbar or lumbosacral intervertebral disc   . Allergic rhinitis, cause unspecified   . Hx of hysterectomy   . Lower GI bleed 07/23/2010  . Chronic LBP 10/04/2010     Past Surgical History  Procedure Date  . Total abdominal hysterectomy   . Lumbar fusion 01/2007    DR. Noel Gerold...3-LEVEL WITH FIXATION  . Parathyroidectomy   . Thyroidectomy   . Cardiac catheterization     PCI OF BOTH THE CIRCUMFLEX AND LEFT ANTERIOR DESCENDING ARTERY  . Cesarean section   . Heart stent 04-2010    X2    Family History  Problem Relation Age of Onset  . Heart attack Mother 62    s/p D&C-CARDIAC ARREST 1966  . Heart attack Father 25    1978 WITH MI  . Colon cancer Neg Hx     History   Social History  . Marital Status: Divorced    Spouse Name: N/A    Number of Children: 3  . Years of Education: N/A   Occupational History  . 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     Social History Main Topics  . Smoking status: Former Smoker    Types: Cigarettes  . Smokeless  tobacco: Never Used  . Alcohol Use: No  . Drug Use: No  . Sexually Active: Not on file   Other Topics Concern  . Not on file   Social History Narrative   DIVORCED3 CHILDRENPATIENT SIGNED A DESIGNATED PARTY RELEASE TO ALLOW HER DAUGHTER, TRAMAINE Giles, TO HAVE ACCESS TO HER MEDICAL RECORDS/INFORMATION. Daphane Shepherd, May 04, 2009 @ 3:27 PMSmokes cigarettes on rare occasions.    ROS: Please see the HPI.  All other systems reviewed and negative.  PHYSICAL EXAM:  BP 118/72  Pulse 78  Ht 5\' 6"  (1.676 m)  Wt 227 lb 1.9 oz (103.021 kg)  BMI 36.66 kg/m2  General: Well developed, well nourished, in no acute distress. Head:  Normocephalic and atraumatic. Neck: no JVD Lungs: Clear to auscultation and percussion. Heart: Normal S1 and S2.  No murmur, rubs or gallops.  Abdomen:  Normal bowel sounds; soft; non tender; no organomegaly Pulses: Pulses normal in all 4 extremities. Extremities: No clubbing or cyanosis. No edema. Neurologic: Alert and oriented x 3.  EKG:  NSR.  Left axis deviation.  Pulmonary disease pattern.   ASSESSMENT AND PLAN:

## 2010-11-24 NOTE — Assessment & Plan Note (Signed)
Remains on lipid lowering.  Hopefully, she is taking her meds.

## 2010-11-24 NOTE — Assessment & Plan Note (Signed)
Well controlled at present

## 2010-12-03 ENCOUNTER — Other Ambulatory Visit (HOSPITAL_COMMUNITY): Payer: Medicare Other

## 2010-12-14 ENCOUNTER — Encounter: Payer: Self-pay | Admitting: Cardiology

## 2010-12-14 ENCOUNTER — Other Ambulatory Visit: Payer: Self-pay | Admitting: Internal Medicine

## 2010-12-14 ENCOUNTER — Ambulatory Visit (INDEPENDENT_AMBULATORY_CARE_PROVIDER_SITE_OTHER): Payer: Medicare Other | Admitting: Cardiology

## 2010-12-14 VITALS — BP 109/66 | HR 84 | Ht 66.0 in | Wt 231.0 lb

## 2010-12-14 DIAGNOSIS — I2510 Atherosclerotic heart disease of native coronary artery without angina pectoris: Secondary | ICD-10-CM

## 2010-12-14 DIAGNOSIS — F17200 Nicotine dependence, unspecified, uncomplicated: Secondary | ICD-10-CM

## 2010-12-14 DIAGNOSIS — E785 Hyperlipidemia, unspecified: Secondary | ICD-10-CM

## 2010-12-14 NOTE — Progress Notes (Signed)
HPI:  Doing well from a cardiac standpoint.  Not having any chest pain per se.  She feels a little "electricity" course through her body, but it is brief, nothing like she had in the past when she was having trouble.  No ongoing symptoms.    Current Outpatient Prescriptions  Medication Sig Dispense Refill  . aspirin EC 81 MG tablet Take4 times a week      . atenolol (TENORMIN) 25 MG tablet Take 25 mg by mouth daily.       . Diazepam (VALIUM PO) Take 1 tablet by mouth as needed.        . fexofenadine (ALLEGRA) 180 MG tablet Take 1 tablet (180 mg total) by mouth daily as needed.  90 tablet  3  . levothyroxine (SYNTHROID, LEVOTHROID) 150 MCG tablet Take 150 mcg by mouth daily.        . metFORMIN (GLUCOPHAGE-XR) 500 MG 24 hr tablet TAKE 3 TABLETS BY MOUTH EVERY MORNING  270 tablet  0  . nitroGLYCERIN (NITROSTAT) 0.4 MG SL tablet Place 1 tablet (0.4 mg total) under the tongue every 5 (five) minutes as needed.  25 tablet  2  . omeprazole (PRILOSEC OTC) 20 MG tablet Take 20 mg by mouth as needed.       . rosuvastatin (CRESTOR) 20 MG tablet Take 1 tablet (20 mg total) by mouth daily.  90 tablet  3    Allergies  Allergen Reactions  . Doxycycline     REACTION: gi upset  . Miconazole Nitrate     REACTION: hives  . Prilosec (Omeprazole) Other (See Comments)    Chest pain    Past Medical History  Diagnosis Date  . Coronary artery disease     PCI of the left circumflex and LAD  . Hyperlipidemia   . Hypertension   . Obesity, unspecified     MODERATE  . Thyroid disease     HYPOTHYROIDISM  . Diabetes mellitus     TYPE II  . Anxiety state, unspecified   . Depressive disorder, not elsewhere classified   . Unspecified menopausal and postmenopausal disorder   . Simple or unspecified chronic serous otitis media   . Degeneration of lumbar or lumbosacral intervertebral disc   . Allergic rhinitis, cause unspecified   . Hx of hysterectomy   . Lower GI bleed 07/23/2010  . Chronic LBP 10/04/2010     Past Surgical History  Procedure Date  . Total abdominal hysterectomy   . Lumbar fusion 01/2007    DR. Noel Gerold...3-LEVEL WITH FIXATION  . Parathyroidectomy   . Thyroidectomy   . Cardiac catheterization     PCI OF BOTH THE CIRCUMFLEX AND LEFT ANTERIOR DESCENDING ARTERY  . Cesarean section   . Heart stent 04-2010    X2    Family History  Problem Relation Age of Onset  . Heart attack Mother 72    s/p D&C-CARDIAC ARREST 1966  . Heart attack Father 84    1978 WITH MI  . Colon cancer Neg Hx     History   Social History  . Marital Status: Divorced    Spouse Name: N/A    Number of Children: 3  . Years of Education: N/A   Occupational History  . 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     Social History Main Topics  . Smoking status: Former Smoker    Types: Cigarettes  . Smokeless tobacco: Never Used  . Alcohol Use: No  .  Drug Use: No  . Sexually Active: Not on file   Other Topics Concern  . Not on file   Social History Narrative   DIVORCED3 CHILDRENPATIENT SIGNED A DESIGNATED PARTY RELEASE TO ALLOW HER DAUGHTER, TRAMAINE Gilberti, TO HAVE ACCESS TO HER MEDICAL RECORDS/INFORMATION. Daphane Shepherd, May 04, 2009 @ 3:27 PMSmokes cigarettes on rare occasions.    ROS: Please see the HPI.  All other systems reviewed and negative.  PHYSICAL EXAM:  BP 109/66  Pulse 84  Ht 5\' 6"  (1.676 m)  Wt 104.781 kg (231 lb)  BMI 37.28 kg/m2  General: Well developed, well nourished, in no acute distress. Head:  Normocephalic and atraumatic. Neck: no JVD Lungs: Clear to auscultation and percussion. Heart: Normal S1 and S2.  No murmur, rubs or gallops.  Pulses: Pulses normal in all 4 extremities. Extremities: No clubbing or cyanosis. No edema. Neurologic: Alert and oriented x 3.  EKG:  NSR.  Left axis deviation.   ASSESSMENT AND PLAN:

## 2010-12-14 NOTE — Patient Instructions (Signed)
Your physician recommends that you schedule a follow-up appointment in: 3 MONTHS  Your physician recommends that you continue on your current medications as directed. Please refer to the Current Medication list given to you today.   

## 2010-12-14 NOTE — Assessment & Plan Note (Signed)
No recurrent symptoms of concern.  Continue to monitor.

## 2010-12-14 NOTE — Progress Notes (Signed)
Patient ID: [name], female   DOB: September 23, 1945, 65 y.o.   MRN: [id number]

## 2010-12-20 ENCOUNTER — Inpatient Hospital Stay (HOSPITAL_COMMUNITY): Admission: RE | Admit: 2010-12-20 | Payer: Medicare Other | Source: Ambulatory Visit

## 2010-12-27 ENCOUNTER — Inpatient Hospital Stay (HOSPITAL_COMMUNITY): Admission: RE | Admit: 2010-12-27 | Payer: Medicare Other | Source: Ambulatory Visit | Admitting: Orthopedic Surgery

## 2010-12-27 ENCOUNTER — Encounter (HOSPITAL_COMMUNITY): Admission: RE | Payer: Self-pay | Source: Ambulatory Visit

## 2010-12-27 SURGERY — ARTHROPLASTY, KNEE, TOTAL
Anesthesia: Regional | Laterality: Right

## 2011-01-12 NOTE — Assessment & Plan Note (Signed)
Will need to reassess once she is taking regularly, and decide on final dose.  She stops at times.

## 2011-01-12 NOTE — Assessment & Plan Note (Signed)
Ongoing discussion.

## 2011-01-21 ENCOUNTER — Other Ambulatory Visit: Payer: Self-pay | Admitting: Internal Medicine

## 2011-01-24 NOTE — Telephone Encounter (Signed)
Faxed hardcopy to pharmacy. 

## 2011-01-24 NOTE — Telephone Encounter (Signed)
thyroid med done escript  Pain med done hardcopy to [facility]

## 2011-01-27 ENCOUNTER — Other Ambulatory Visit: Payer: Self-pay | Admitting: Internal Medicine

## 2011-02-17 ENCOUNTER — Ambulatory Visit: Payer: Medicare Other | Admitting: Internal Medicine

## 2011-02-21 ENCOUNTER — Other Ambulatory Visit (INDEPENDENT_AMBULATORY_CARE_PROVIDER_SITE_OTHER): Payer: Medicare Other

## 2011-02-21 ENCOUNTER — Ambulatory Visit (INDEPENDENT_AMBULATORY_CARE_PROVIDER_SITE_OTHER): Payer: Medicare Other | Admitting: Internal Medicine

## 2011-02-21 ENCOUNTER — Encounter: Payer: Self-pay | Admitting: Internal Medicine

## 2011-02-21 VITALS — BP 100/60 | HR 84 | Temp 97.0°F | Ht 66.0 in | Wt 223.2 lb

## 2011-02-21 DIAGNOSIS — R109 Unspecified abdominal pain: Secondary | ICD-10-CM

## 2011-02-21 DIAGNOSIS — E785 Hyperlipidemia, unspecified: Secondary | ICD-10-CM

## 2011-02-21 DIAGNOSIS — E119 Type 2 diabetes mellitus without complications: Secondary | ICD-10-CM | POA: Diagnosis not present

## 2011-02-21 DIAGNOSIS — I10 Essential (primary) hypertension: Secondary | ICD-10-CM | POA: Diagnosis not present

## 2011-02-21 LAB — CBC WITH DIFFERENTIAL/PLATELET
Basophils Absolute: 0.1 10*3/uL (ref 0.0–0.1)
Eosinophils Relative: 1.3 % (ref 0.0–5.0)
Lymphocytes Relative: 38.7 % (ref 12.0–46.0)
Lymphs Abs: 3.4 10*3/uL (ref 0.7–4.0)
Monocytes Relative: 7 % (ref 3.0–12.0)
Neutrophils Relative %: 52.3 % (ref 43.0–77.0)
Platelets: 324 10*3/uL (ref 150.0–400.0)
RDW: 14.6 % (ref 11.5–14.6)
WBC: 8.7 10*3/uL (ref 4.5–10.5)

## 2011-02-21 LAB — BASIC METABOLIC PANEL
BUN: 8 mg/dL (ref 6–23)
Calcium: 8.9 mg/dL (ref 8.4–10.5)
Creatinine, Ser: 0.8 mg/dL (ref 0.4–1.2)
GFR: 93.78 mL/min (ref >60.00)
Glucose, Bld: 104 mg/dL — ABNORMAL HIGH (ref 70–99)

## 2011-02-21 LAB — LIPID PANEL
Cholesterol: 176 mg/dL (ref 0–200)
HDL: 32.2 mg/dL — ABNORMAL LOW (ref >39.00)
LDL Cholesterol: 113 mg/dL — ABNORMAL HIGH (ref 0–99)
VLDL: 31.2 mg/dL (ref 0.0–40.0)

## 2011-02-21 LAB — URINALYSIS, ROUTINE W REFLEX MICROSCOPIC
Bilirubin Urine: NEGATIVE
Nitrite: POSITIVE
Specific Gravity, Urine: 1.02 (ref 1.000–1.030)
pH: 5.5 (ref 5.0–8.0)

## 2011-02-21 LAB — HEMOGLOBIN A1C: Hgb A1c MFr Bld: 6.6 % — ABNORMAL HIGH (ref 4.6–6.5)

## 2011-02-21 MED ORDER — CEFTRIAXONE SODIUM 1 G IJ SOLR
1.0000 g | Freq: Once | INTRAMUSCULAR | Status: AC
  Administered 2011-02-21: 1 g via INTRAMUSCULAR

## 2011-02-21 MED ORDER — CIPROFLOXACIN HCL 500 MG PO TABS: 500.0000 mg | ORAL_TABLET | Freq: Two times a day (BID) | ORAL | Status: AC

## 2011-02-21 NOTE — Assessment & Plan Note (Signed)
With hx of AAA and DM - goal ldl < 70, for lipids today

## 2011-02-21 NOTE — Patient Instructions (Signed)
You had the antibiotic shot today Take all new medications as prescribed Please go to LAB in the [facility] for the blood and/or urine tests to be done today Please keep your appointments with your specialists as you have planned - Dr Stuckey/card Please return in 6 months, or sooner if needed

## 2011-02-22 ENCOUNTER — Other Ambulatory Visit: Payer: Self-pay | Admitting: Internal Medicine

## 2011-02-22 MED ORDER — ROSUVASTATIN CALCIUM 40 MG PO TABS: 40.0000 mg | ORAL_TABLET | Freq: Every day | ORAL | Status: DC

## 2011-02-24 ENCOUNTER — Ambulatory Visit (INDEPENDENT_AMBULATORY_CARE_PROVIDER_SITE_OTHER): Payer: Medicare Other | Admitting: Internal Medicine

## 2011-02-24 ENCOUNTER — Encounter: Payer: Self-pay | Admitting: Internal Medicine

## 2011-02-24 VITALS — BP 110/68 | HR 71 | Temp 97.8°F

## 2011-02-24 DIAGNOSIS — N390 Urinary tract infection, site not specified: Secondary | ICD-10-CM

## 2011-02-24 MED ORDER — CEFTRIAXONE SODIUM 1 G IJ SOLR
1.0000 g | Freq: Once | INTRAMUSCULAR | Status: AC
  Administered 2011-02-24: 1 g via INTRAMUSCULAR

## 2011-02-24 NOTE — Patient Instructions (Signed)
Rocephin shot done today for UTI symptoms -  Pick up Cipro when you are able and start taking same IF still having bladder and UTI symptoms -

## 2011-02-24 NOTE — Progress Notes (Signed)
  Subjective:    Patient ID: [name], female    DOB: [birth year], 66 y.o.   MRN: [id number]  HPI  complains of continued dysuria Treated with IM Rocephin 2 days ago for same by primary care physician Unable to pick up Cipro at pharmacy due to cost restraints No fever or new symptoms but requests repeat injection treatment  PMH reviewed  Review of Systems Constitutional: No fever or unexpected weight change GU: No hematuria, positive for urinary frequency and dysuria     Objective:   Physical Exam BP 110/68  Pulse 71  Temp(Src) 97.8 F (36.6 C) (Oral)  SpO2 99% General: No acute distress Cardiovascular: Regular rate rhythm Lungs: Clear to auscultation Abdomen: Mildly tender over suprapubic region. No flank tenderness to palpation  CPX labs last week reviewed: Positive UTI with many bacteria and leukocytes, no culture      Assessment & Plan:  UTI, classic symptoms. Unable to start oral antibiotics therapy due to financial restraints Repeat IM Rocephin today at patient request - may have enough efficacy to complete therapy if symptoms resolve, otherwise to take Cipro as prescribed once financially able to afford (few days)

## 2011-02-27 ENCOUNTER — Encounter: Payer: Self-pay | Admitting: Internal Medicine

## 2011-02-27 NOTE — Assessment & Plan Note (Signed)
stable overall by hx and exam, most recent data reviewed with pt, and pt to continue medical treatment as before  BP Readings from Last 3 Encounters:  02/24/11 110/68  02/21/11 100/60  12/14/10 109/66

## 2011-02-27 NOTE — Assessment & Plan Note (Addendum)
?   UTI - for UA today, for rocephin IM, and oral med,  to f/u any worsening symptoms or concerns

## 2011-02-27 NOTE — Assessment & Plan Note (Signed)
stable overall by hx and exam, most recent data reviewed with pt, and pt to continue medical treatment as before Lab Results  Component Value Date   HGBA1C 6.6* 02/21/2011    

## 2011-02-27 NOTE — Progress Notes (Signed)
Subjective:    Patient ID: [name], female    DOB: [birth year], 66 y.o.   MRN: [id number]  HPI  Here to f/u; overall doing ok,  Pt denies chest pain, increased sob or doe, wheezing, orthopnea, PND, increased LE swelling, palpitations, dizziness or syncope.  Pt denies new neurological symptoms such as new headache, or facial or extremity weakness or numbness   Pt denies polydipsia, polyuria, or low sugar symptoms such as weakness or confusion improved with po intake.  Pt states overall good compliance with meds, trying to follow lower cholesterol, diabetic diet, wt overall stable but little exercise however.  Does have lower mid abd soreness and tender with ? Urinary freq, but denies urgency, hematuria or dysuria., all for 2 days.  No n/v, flank pain, f/c.   Does have f/u with Dr Malka So for feb 28 Past Medical History  Diagnosis Date  . Coronary artery disease     PCI of the left circumflex and LAD  . Hyperlipidemia   . Hypertension   . Obesity, unspecified     MODERATE  . Thyroid disease     HYPOTHYROIDISM  . Diabetes mellitus     TYPE II  . Anxiety state, unspecified   . Depressive disorder, not elsewhere classified   . Unspecified menopausal and postmenopausal disorder   . Simple or unspecified chronic serous otitis media   . Degeneration of lumbar or lumbosacral intervertebral disc   . Allergic rhinitis, cause unspecified   . Hx of hysterectomy   . Lower GI bleed 07/23/2010  . Chronic LBP    Past Surgical History  Procedure Date  . Total abdominal hysterectomy   . Lumbar fusion 01/2007    DR. Noel Gerold...3-LEVEL WITH FIXATION  . Parathyroidectomy   . Thyroidectomy   . Cardiac catheterization     PCI OF BOTH THE CIRCUMFLEX AND LEFT ANTERIOR DESCENDING ARTERY  . Cesarean section   . Heart stent 04-2010    X2    reports that she has quit smoking. Her smoking use included Cigarettes. She has never used smokeless tobacco. She reports that she does not drink alcohol or use  illicit drugs. family history includes Heart attack (age of onset:40) in her mother and Heart attack (age of onset:52) in her father.  There is no history of Colon cancer. Allergies  Allergen Reactions  . Doxycycline     REACTION: gi upset  . Miconazole Nitrate     REACTION: hives  . Prilosec (Omeprazole) Other (See Comments)    Chest pain   Current Outpatient Prescriptions on File Prior to Visit  Medication Sig Dispense Refill  . aspirin EC 81 MG tablet Take4 times a week      . atenolol (TENORMIN) 25 MG tablet TAKE 1 TABLET BY MOUTH TWICE DAILY  180 tablet  0  . Diazepam (VALIUM PO) Take 1 tablet by mouth as needed.        . fexofenadine (ALLEGRA) 180 MG tablet Take 1 tablet (180 mg total) by mouth daily as needed.  90 tablet  3  . HYDROcodone-acetaminophen (NORCO) 10-325 MG per tablet TAKE 1 TABLET BY MOUTH AT BEDTIME AS NEEDED FOR PAIN  30 tablet  2  . metFORMIN (GLUCOPHAGE-XR) 500 MG 24 hr tablet TAKE 3 TABLETS BY MOUTH EVERY MORNING  270 tablet  0  . nitroGLYCERIN (NITROSTAT) 0.4 MG SL tablet Place 1 tablet (0.4 mg total) under the tongue every 5 (five) minutes as needed.  25 tablet  2  . omeprazole (  PRILOSEC OTC) 20 MG tablet Take 20 mg by mouth as needed.       [name] SYNTHROID 150 MCG tablet TAKE 1 TABLET BY MOUTH EVERY DAY  90 tablet  2  . HYDROcodone-acetaminophen (NORCO) 10-325 MG per tablet Take 1 tablet by mouth at bedtime as needed for pain.  30 tablet  3  . HYDROcodone-acetaminophen (NORCO) 10-325 MG per tablet Take 1 tablet by mouth every 6 (six) hours as needed for pain.  60 tablet  1  . rosuvastatin (CRESTOR) 40 MG tablet Take 1 tablet (40 mg total) by mouth daily.  90 tablet  3  . DISCONTD: aspirin EC 81 MG tablet Take 1 tablet (81 mg total) by mouth daily.  150 tablet  2   Review of Systems Review of Systems  Constitutional: Negative for diaphoresis and unexpected weight change.  HENT: Negative for drooling and tinnitus.   Eyes: Negative for photophobia and visual  disturbance.  Respiratory: Negative for choking and stridor.   Gastrointestinal: Negative for vomiting and blood in stool.   Musculoskeletal: Negative for gait problem.  Skin: Negative for color change and wound.     Objective:   Physical Exam BP 100/60  Pulse 84  Temp(Src) 97 F (36.1 C) (Oral)  Ht 5\' 6"  (1.676 m)  Wt 223 lb 4 oz (101.266 kg)  BMI 36.03 kg/m2  SpO2 97% Physical Exam  VS noted, mild ill Constitutional: Pt appears well-developed and well-nourished.  HENT: Head: Normocephalic.  Right Ear: External ear normal.  Left Ear: External ear normal.  Eyes: Conjunctivae and EOM are normal. Pupils are equal, round, and reactive to light.  Neck: Normal range of motion. Neck supple.  Cardiovascular: Normal rate and regular rhythm.   Pulmonary/Chest: Effort normal and breath sounds normal.  Abd:  Soft, NT, non-distended, + BS except for low mid abd tender, without guarding or rebound Neurological: Pt is alert. No cranial nerve deficit.  Skin: Skin is warm. No erythema.  Psychiatric: Pt behavior is normal. Thought content normal.     Assessment & Plan:

## 2011-03-10 DIAGNOSIS — Z961 Presence of intraocular lens: Secondary | ICD-10-CM | POA: Diagnosis not present

## 2011-03-12 ENCOUNTER — Other Ambulatory Visit: Payer: Self-pay | Admitting: Internal Medicine

## 2011-03-17 ENCOUNTER — Ambulatory Visit (INDEPENDENT_AMBULATORY_CARE_PROVIDER_SITE_OTHER): Payer: Medicare Other | Admitting: Cardiology

## 2011-03-17 ENCOUNTER — Encounter: Payer: Self-pay | Admitting: Cardiology

## 2011-03-17 DIAGNOSIS — I714 Abdominal aortic aneurysm, without rupture, unspecified: Secondary | ICD-10-CM | POA: Diagnosis not present

## 2011-03-17 DIAGNOSIS — I2510 Atherosclerotic heart disease of native coronary artery without angina pectoris: Secondary | ICD-10-CM | POA: Diagnosis not present

## 2011-03-17 DIAGNOSIS — E785 Hyperlipidemia, unspecified: Secondary | ICD-10-CM

## 2011-03-17 DIAGNOSIS — E039 Hypothyroidism, unspecified: Secondary | ICD-10-CM | POA: Diagnosis not present

## 2011-03-17 MED ORDER — ROSUVASTATIN CALCIUM 20 MG PO TABS: 20.0000 mg | ORAL_TABLET | Freq: Every day | ORAL | Status: DC

## 2011-03-17 NOTE — Assessment & Plan Note (Signed)
No recurrent chest pain.  Continue to monitor at present.

## 2011-03-17 NOTE — Progress Notes (Signed)
HPI:  She is actually doing pretty well.  No chest pain of frequency---occasional from time to time.  She does smoke when she gets upset.  No exertional symptoms.  j  Current Outpatient Prescriptions  Medication Sig Dispense Refill  . aspirin EC 81 MG tablet Take4 times a week      . atenolol (TENORMIN) 25 MG tablet TAKE 1 TABLET BY MOUTH TWICE DAILY  180 tablet  0  . Diazepam (VALIUM PO) Take 1 tablet by mouth as needed.        . fexofenadine (ALLEGRA) 180 MG tablet Take 1 tablet (180 mg total) by mouth daily as needed.  90 tablet  3  . HYDROcodone-acetaminophen (NORCO) 10-325 MG per tablet TAKE 1 TABLET BY MOUTH AT BEDTIME AS NEEDED FOR PAIN  30 tablet  2  . metFORMIN (GLUCOPHAGE-XR) 500 MG 24 hr tablet TAKE 3 TABLETS BY MOUTH EVERY MORNING  270 tablet  3  . nitroGLYCERIN (NITROSTAT) 0.4 MG SL tablet Place 1 tablet (0.4 mg total) under the tongue every 5 (five) minutes as needed.  25 tablet  2  . omeprazole (PRILOSEC OTC) 20 MG tablet Take 20 mg by mouth as needed.       [name] SYNTHROID 150 MCG tablet TAKE 1 TABLET BY MOUTH EVERY DAY  90 tablet  2  . HYDROcodone-acetaminophen (NORCO) 10-325 MG per tablet Take 1 tablet by mouth at bedtime as needed for pain.  30 tablet  3  . HYDROcodone-acetaminophen (NORCO) 10-325 MG per tablet Take 1 tablet by mouth every 6 (six) hours as needed for pain.  60 tablet  1  . rosuvastatin (CRESTOR) 40 MG tablet Take 1 tablet (40 mg total) by mouth daily.  90 tablet  3  . DISCONTD: aspirin EC 81 MG tablet Take 1 tablet (81 mg total) by mouth daily.  150 tablet  2    Allergies  Allergen Reactions  . Doxycycline     REACTION: gi upset  . Miconazole Nitrate     REACTION: hives  . Prilosec (Omeprazole) Other (See Comments)    Chest pain    Past Medical History  Diagnosis Date  . Coronary artery disease     PCI of the left circumflex and LAD  . Hyperlipidemia   . Hypertension   . Obesity, unspecified     MODERATE  . Thyroid disease     HYPOTHYROIDISM    . Diabetes mellitus     TYPE II  . Anxiety state, unspecified   . Depressive disorder, not elsewhere classified   . Unspecified menopausal and postmenopausal disorder   . Simple or unspecified chronic serous otitis media   . Degeneration of lumbar or lumbosacral intervertebral disc   . Allergic rhinitis, cause unspecified   . Hx of hysterectomy   . Lower GI bleed 07/23/2010  . Chronic LBP     Past Surgical History  Procedure Date  . Total abdominal hysterectomy   . Lumbar fusion 01/2007    DR. Noel Gerold...3-LEVEL WITH FIXATION  . Parathyroidectomy   . Thyroidectomy   . Cardiac catheterization     PCI OF BOTH THE CIRCUMFLEX AND LEFT ANTERIOR DESCENDING ARTERY  . Cesarean section   . Heart stent 04-2010    X2    Family History  Problem Relation Age of Onset  . Heart attack Mother 55    s/p D&C-CARDIAC ARREST 1966  . Heart attack Father 7    1978 WITH MI  . Colon cancer Neg Hx  History   Social History  . Marital Status: Divorced    Spouse Name: N/A    Number of Children: 3  . Years of Education: N/A   Occupational History  . 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     Social History Main Topics  . Smoking status: Former Smoker    Types: Cigarettes  . Smokeless tobacco: Never Used  . Alcohol Use: No  . Drug Use: No  . Sexually Active: Not on file   Other Topics Concern  . Not on file   Social History Narrative   DIVORCED3 CHILDRENPATIENT SIGNED A DESIGNATED PARTY RELEASE TO ALLOW HER DAUGHTER, TRAMAINE Plate, TO HAVE ACCESS TO HER MEDICAL RECORDS/INFORMATION. Daphane Shepherd, May 04, 2009 @ 3:27 PMSmokes cigarettes on rare occasions.    ROS: Please see the HPI.  All other systems reviewed and negative.  PHYSICAL EXAM:  BP 122/80  Pulse 80  Ht 5' 6.5" (1.689 m)  Wt 227 lb 12.8 oz (103.329 kg)  BMI 36.22 kg/m2  General: Well developed, well nourished, in no acute distress. Head:  Normocephalic and atraumatic. Neck: no JVD Lungs: Clear to  auscultation and percussion. Heart: Normal S1 and S2.  No murmur, rubs or gallops.  Abdomen:  Normal bowel sounds; soft; non tender; no organomegaly.  I cannot appreciate the aneurysm.  Extremities: No clubbing or cyanosis. No edema. Neurologic: Alert and oriented x 3.  EKG:  NSR.  Left axis deviation.  Delay in R wave progression.   ASSESSMENT AND PLAN:

## 2011-03-17 NOTE — Patient Instructions (Addendum)
Your physician has requested that you have an abdominal aorta duplex. During this test, an ultrasound is used to evaluate the aorta. Allow 30 minutes for this exam. Do not eat after midnight the day before and avoid carbonated beverages  Your physician has recommended you make the following change in your medication: RESTART Crestor 20mg  daily  Your physician recommends that you return for a FASTING LIPID and LIVER profile in 6 WEEKS--nothing to eat or drink after midnight, lab opens at 8:30  Your physician recommends that you schedule a follow-up appointment in: 3 MONTHS

## 2011-03-17 NOTE — Assessment & Plan Note (Signed)
Needs a repeat study.

## 2011-03-17 NOTE — Assessment & Plan Note (Signed)
Long term thyroid replacement.

## 2011-03-17 NOTE — Assessment & Plan Note (Signed)
Patient not taking statin--restart Crestor at 20mg .  Lipid and liver in six weeks.  May be an ACCELERATE candidate if she is staying on her statin.

## 2011-03-24 ENCOUNTER — Encounter: Payer: Self-pay | Admitting: Internal Medicine

## 2011-03-24 ENCOUNTER — Ambulatory Visit (INDEPENDENT_AMBULATORY_CARE_PROVIDER_SITE_OTHER): Payer: Medicare Other | Admitting: Internal Medicine

## 2011-03-24 VITALS — BP 102/60 | HR 76 | Temp 98.9°F | Ht 66.0 in | Wt 229.1 lb

## 2011-03-24 DIAGNOSIS — I10 Essential (primary) hypertension: Secondary | ICD-10-CM | POA: Diagnosis not present

## 2011-03-24 DIAGNOSIS — H6691 Otitis media, unspecified, right ear: Secondary | ICD-10-CM

## 2011-03-24 DIAGNOSIS — H6690 Otitis media, unspecified, unspecified ear: Secondary | ICD-10-CM

## 2011-03-24 DIAGNOSIS — E119 Type 2 diabetes mellitus without complications: Secondary | ICD-10-CM

## 2011-03-24 MED ORDER — CEFDINIR 300 MG PO CAPS: 300.0000 mg | ORAL_CAPSULE | Freq: Two times a day (BID) | ORAL | Status: AC

## 2011-03-24 MED ORDER — CEFTRIAXONE SODIUM 1 G IJ SOLR
1.0000 g | Freq: Once | INTRAMUSCULAR | Status: AC
  Administered 2011-03-24: 1 g via INTRAMUSCULAR

## 2011-03-24 MED ORDER — HYDROCODONE-HOMATROPINE 5-1.5 MG/5ML PO SYRP: 5.0000 mL | ORAL_SOLUTION | Freq: Four times a day (QID) | ORAL | Status: AC | PRN

## 2011-03-24 NOTE — Patient Instructions (Signed)
You had the antibiotic shot today Take all new medications as prescribed Continue all other medications as before  

## 2011-03-27 ENCOUNTER — Encounter: Payer: Self-pay | Admitting: Internal Medicine

## 2011-03-27 NOTE — Assessment & Plan Note (Signed)
Mild to mod, for antibx course,  to f/u any worsening symptoms or concerns 

## 2011-03-27 NOTE — Assessment & Plan Note (Signed)
stable overall by hx and exam, most recent data reviewed with pt, and pt to continue medical treatment as before  BP Readings from Last 3 Encounters:  03/24/11 102/60  03/17/11 122/80  02/24/11 110/68

## 2011-03-27 NOTE — Assessment & Plan Note (Signed)
stable overall by hx and exam, most recent data reviewed with pt, and pt to continue medical treatment as before Lab Results  Component Value Date   HGBA1C 6.6* 02/21/2011    

## 2011-03-27 NOTE — Progress Notes (Signed)
Subjective:    Patient ID: [name], female    DOB: [birth year], 66 y.o.   MRN: [id number]  HPI  Here to f/u; overall doing ok,  Pt denies chest pain, increased sob or doe, wheezing, orthopnea, PND, increased LE swelling, palpitations, dizziness or syncope.  Pt denies new neurological symptoms such as new headache, or facial or extremity weakness or numbness   Pt denies polydipsia, polyuria, or low sugar symptoms such as weakness or confusion improved with po intake.  Pt states overall good compliance with meds, trying to follow lower cholesterol, diabetic diet, wt overall stable but little exercise however.  Does have right ear pain, pressure, feverish with slight decreased hearing for 3 days Past Medical History  Diagnosis Date  . Coronary artery disease     PCI of the left circumflex and LAD  . Hyperlipidemia   . Hypertension   . Obesity, unspecified     MODERATE  . Thyroid disease     HYPOTHYROIDISM  . Diabetes mellitus     TYPE II  . Anxiety state, unspecified   . Depressive disorder, not elsewhere classified   . Unspecified menopausal and postmenopausal disorder   . Simple or unspecified chronic serous otitis media   . Degeneration of lumbar or lumbosacral intervertebral disc   . Allergic rhinitis, cause unspecified   . Hx of hysterectomy   . Lower GI bleed 07/23/2010  . Chronic LBP    Past Surgical History  Procedure Date  . Total abdominal hysterectomy   . Lumbar fusion 01/2007    DR. Noel Gerold...3-LEVEL WITH FIXATION  . Parathyroidectomy   . Thyroidectomy   . Cardiac catheterization     PCI OF BOTH THE CIRCUMFLEX AND LEFT ANTERIOR DESCENDING ARTERY  . Cesarean section   . Heart stent 04-2010    X2    reports that she has quit smoking. Her smoking use included Cigarettes. She has never used smokeless tobacco. She reports that she does not drink alcohol or use illicit drugs. family history includes Heart attack (age of onset:40) in her mother and Heart attack (age of  onset:52) in her father.  There is no history of Colon cancer. Allergies  Allergen Reactions  . Doxycycline     REACTION: gi upset  . Miconazole Nitrate     REACTION: hives  . Prilosec (Omeprazole) Other (See Comments)    Chest pain   Current Outpatient Prescriptions on File Prior to Visit  Medication Sig Dispense Refill  . aspirin EC 81 MG tablet Take4 times a week      . atenolol (TENORMIN) 25 MG tablet TAKE 1 TABLET BY MOUTH TWICE DAILY  180 tablet  0  . Diazepam (VALIUM PO) Take 1 tablet by mouth as needed.        . fexofenadine (ALLEGRA) 180 MG tablet Take 1 tablet (180 mg total) by mouth daily as needed.  90 tablet  3  . metFORMIN (GLUCOPHAGE-XR) 500 MG 24 hr tablet TAKE 3 TABLETS BY MOUTH EVERY MORNING  270 tablet  3  . nitroGLYCERIN (NITROSTAT) 0.4 MG SL tablet Place 1 tablet (0.4 mg total) under the tongue every 5 (five) minutes as needed.  25 tablet  2  . omeprazole (PRILOSEC OTC) 20 MG tablet Take 20 mg by mouth as needed.       . rosuvastatin (CRESTOR) 20 MG tablet Take 1 tablet (20 mg total) by mouth daily.  30 tablet  6  . HYDROcodone-acetaminophen (NORCO) 10-325 MG per tablet Take 1 tablet  by mouth at bedtime as needed for pain.  30 tablet  3  . HYDROcodone-acetaminophen (NORCO) 10-325 MG per tablet Take 1 tablet by mouth every 6 (six) hours as needed for pain.  60 tablet  1  . HYDROcodone-acetaminophen (NORCO) 10-325 MG per tablet TAKE 1 TABLET BY MOUTH AT BEDTIME AS NEEDED FOR PAIN  30 tablet  2  . SYNTHROID 150 MCG tablet TAKE 1 TABLET BY MOUTH EVERY DAY  90 tablet  2  . DISCONTD: aspirin EC 81 MG tablet Take 1 tablet (81 mg total) by mouth daily.  150 tablet  2   Review of Systems Review of Systems  Constitutional: Negative for diaphoresis and unexpected weight change.  HENT: Negative for drooling and tinnitus.   Eyes: Negative for photophobia and visual disturbance.  Respiratory: Negative for choking and stridor.   Gastrointestinal: Negative for vomiting and blood  in stool.  Genitourinary: Negative for hematuria and decreased urine volume.    Objective:   Physical Exam BP 102/60  Pulse 76  Temp(Src) 98.9 F (37.2 C) (Oral)  Ht 5\' 6"  (1.676 m)  Wt 229 lb 2 oz (103.93 kg)  BMI 36.98 kg/m2  SpO2 95% Physical Exam  VS noted, mild ill Constitutional: Pt appears well-developed and well-nourished.  HENT: Head: Normocephalic.  Right Ear: External ear normal.  Left Ear: External ear normal. right TM severe erythema, nonbulging Eyes: Conjunctivae and EOM are normal. Pupils are equal, round, and reactive to light.  Neck: Normal range of motion. Neck supple.  Cardiovascular: Normal rate and regular rhythm.   Pulmonary/Chest: Effort normal and breath sounds normal.  Neurological: Pt is alert. No cranial nerve deficit.  Skin: Skin is warm. No erythema.  Psychiatric: Pt behavior is normal. Thought content normal.     Assessment & Plan:

## 2011-04-21 ENCOUNTER — Other Ambulatory Visit (INDEPENDENT_AMBULATORY_CARE_PROVIDER_SITE_OTHER): Payer: Medicare Other

## 2011-04-21 ENCOUNTER — Ambulatory Visit (INDEPENDENT_AMBULATORY_CARE_PROVIDER_SITE_OTHER): Payer: Medicare Other | Admitting: Internal Medicine

## 2011-04-21 ENCOUNTER — Encounter: Payer: Self-pay | Admitting: Internal Medicine

## 2011-04-21 VITALS — BP 112/70 | HR 84 | Temp 98.8°F | Ht 66.0 in | Wt 229.4 lb

## 2011-04-21 DIAGNOSIS — M545 Low back pain, unspecified: Secondary | ICD-10-CM

## 2011-04-21 DIAGNOSIS — R300 Dysuria: Secondary | ICD-10-CM | POA: Insufficient documentation

## 2011-04-21 DIAGNOSIS — E039 Hypothyroidism, unspecified: Secondary | ICD-10-CM

## 2011-04-21 DIAGNOSIS — I714 Abdominal aortic aneurysm, without rupture, unspecified: Secondary | ICD-10-CM

## 2011-04-21 DIAGNOSIS — E785 Hyperlipidemia, unspecified: Secondary | ICD-10-CM | POA: Diagnosis not present

## 2011-04-21 DIAGNOSIS — R5383 Other fatigue: Secondary | ICD-10-CM

## 2011-04-21 DIAGNOSIS — G8929 Other chronic pain: Secondary | ICD-10-CM

## 2011-04-21 DIAGNOSIS — I2510 Atherosclerotic heart disease of native coronary artery without angina pectoris: Secondary | ICD-10-CM | POA: Diagnosis not present

## 2011-04-21 DIAGNOSIS — J309 Allergic rhinitis, unspecified: Secondary | ICD-10-CM

## 2011-04-21 LAB — URINALYSIS, ROUTINE W REFLEX MICROSCOPIC
Leukocytes, UA: NEGATIVE
Nitrite: NEGATIVE
Specific Gravity, Urine: 1.015 (ref 1.000–1.030)
Urobilinogen, UA: 0.2 (ref 0.0–1.0)
pH: 5.5 (ref 5.0–8.0)

## 2011-04-21 MED ORDER — CETIRIZINE HCL 10 MG PO TABS: 10.0000 mg | ORAL_TABLET | Freq: Every day | ORAL | Status: DC

## 2011-04-21 MED ORDER — ROSUVASTATIN CALCIUM 20 MG PO TABS: 20.0000 mg | ORAL_TABLET | Freq: Every day | ORAL | Status: DC

## 2011-04-21 MED ORDER — LEVOFLOXACIN 250 MG PO TABS: 250.0000 mg | ORAL_TABLET | Freq: Every day | ORAL | Status: AC

## 2011-04-21 MED ORDER — FLUCONAZOLE 150 MG PO TABS: ORAL_TABLET | ORAL | Status: AC

## 2011-04-21 NOTE — Patient Instructions (Addendum)
Ok to stop the Biochemist, clinical the generic zyrtec as directed for allergies Please call if you need generic flonase nasal spray for persistent allergic congestion Take all new medications as prescribed - the antibiotic, and the yeast medication Please go to LAB in the Basement for the urine tests to be done today Please remember to followup with your back surgeon as you mentioned today Please re-start the Crestor at 20 mg per day You can also take Delsym OTC for cough, and/or Mucinex (or it's generic off brand) for congestion

## 2011-04-24 ENCOUNTER — Other Ambulatory Visit: Payer: Self-pay | Admitting: Internal Medicine

## 2011-04-24 ENCOUNTER — Encounter: Payer: Self-pay | Admitting: Internal Medicine

## 2011-04-24 NOTE — Assessment & Plan Note (Signed)
To re-start statin, cont low chol diet, goal ldl < 70, f/u labs next visit

## 2011-04-24 NOTE — Assessment & Plan Note (Signed)
stable overall by hx and exam, most recent data reviewed with pt, and pt to continue medical treatment as before Lab Results  Component Value Date   TSH 0.85 07/23/2010

## 2011-04-24 NOTE — Progress Notes (Signed)
Subjective:    Patient ID: [name], female    DOB: Jul 23, 1945, 66 y.o.   MRN: [id number]  HPI  Here with several concerns, Does have several wks ongoing nasal allergy symptoms with clear congestion, itch and sneeze, without fever, pain, ST, cough or wheezing.  Also with mild onset mild dysuria x 2 days,  Denies other urinary symptoms such as  frequency, urgency,or hematuria.  Pt continues to have recurring LBP without change in severity, bowel or bladder change, fever, wt loss,  worsening LE pain/numbness/weakness, gait change or falls.  Does have sense of ongoing fatigue, but denies signficant hypersomnolence.  Willing not to try re-start statin, as she now thinks recent discomfort with statin may not have been related.   Pt denies fever, wt loss, night sweats, loss of appetite, or other constitutional symptoms  Denies worsening depressive symptoms, suicidal ideation, or panic, though has ongoing anxiety. Denies hyper or hypo thyroid symptoms such as voice, skin or hair change.  Past Medical History  Diagnosis Date  . Coronary artery disease     PCI of the left circumflex and LAD  . Hyperlipidemia   . Hypertension   . Obesity, unspecified     MODERATE  . Thyroid disease     HYPOTHYROIDISM  . Diabetes mellitus     TYPE II  . Anxiety state, unspecified   . Depressive disorder, not elsewhere classified   . Unspecified menopausal and postmenopausal disorder   . Simple or unspecified chronic serous otitis media   . Degeneration of lumbar or lumbosacral intervertebral disc   . Allergic rhinitis, cause unspecified   . Hx of hysterectomy   . Lower GI bleed 07/23/2010  . Chronic LBP    Past Surgical History  Procedure Date  . Total abdominal hysterectomy   . Lumbar fusion 01/2007    DR. Noel Gerold...3-LEVEL WITH FIXATION  . Parathyroidectomy   . Thyroidectomy   . Cardiac catheterization     PCI OF BOTH THE CIRCUMFLEX AND LEFT ANTERIOR DESCENDING ARTERY  . Cesarean section   . Heart stent  04-2010    X2    reports that she has quit smoking. Her smoking use included Cigarettes. She has never used smokeless tobacco. She reports that she does not drink alcohol or use illicit drugs. family history includes Heart attack (age of onset:40) in her mother and Heart attack (age of onset:52) in her father.  There is no history of Colon cancer. Allergies  Allergen Reactions  . Doxycycline     REACTION: gi upset  . Miconazole Nitrate     REACTION: hives  . Prilosec (Omeprazole) Other (See Comments)    Chest pain   Current Outpatient Prescriptions on File Prior to Visit  Medication Sig Dispense Refill  . aspirin EC 81 MG tablet Take4 times a week      . atenolol (TENORMIN) 25 MG tablet TAKE 1 TABLET BY MOUTH TWICE DAILY  180 tablet  0  . HYDROcodone-acetaminophen (NORCO) 10-325 MG per tablet TAKE 1 TABLET BY MOUTH AT BEDTIME AS NEEDED FOR PAIN  30 tablet  2  . metFORMIN (GLUCOPHAGE-XR) 500 MG 24 hr tablet TAKE 3 TABLETS BY MOUTH EVERY MORNING  270 tablet  3  . nitroGLYCERIN (NITROSTAT) 0.4 MG SL tablet Place 1 tablet (0.4 mg total) under the tongue every 5 (five) minutes as needed.  25 tablet  2  . SYNTHROID 150 MCG tablet TAKE 1 TABLET BY MOUTH EVERY DAY  90 tablet  2  . cetirizine (  ZYRTEC) 10 MG tablet Take 1 tablet (10 mg total) by mouth daily.  90 tablet  3  . Diazepam (VALIUM PO) Take 1 tablet by mouth as needed.        [name] HYDROcodone-acetaminophen (NORCO) 10-325 MG per tablet Take 1 tablet by mouth at bedtime as needed for pain.  30 tablet  3  . HYDROcodone-acetaminophen (NORCO) 10-325 MG per tablet Take 1 tablet by mouth every 6 (six) hours as needed for pain.  60 tablet  1  . omeprazole (PRILOSEC OTC) 20 MG tablet Take 20 mg by mouth as needed.       . rosuvastatin (CRESTOR) 20 MG tablet Take 1 tablet (20 mg total) by mouth daily.  90 tablet  3  . DISCONTD: aspirin EC 81 MG tablet Take 1 tablet (81 mg total) by mouth daily.  150 tablet  2   Review of Systems Review of Systems    Constitutional: Negative for diaphoresis and unexpected weight change.  HENT: Negative for drooling and tinnitus.   Eyes: Negative for photophobia and visual disturbance.  Respiratory: Negative for choking and stridor.   Gastrointestinal: Negative for vomiting and blood in stool.  Genitourinary: Negative for hematuria and decreased urine volume.     Objective:   Physical Exam BP 112/70  Pulse 84  Temp(Src) 98.8 F (37.1 C) (Oral)  Ht 5\' 6"  (1.676 m)  Wt 229 lb 6 oz (104.044 kg)  BMI 37.02 kg/m2  SpO2 96% Physical Exam  VS noted, mild ill Constitutional: Pt appears well-developed and well-nourished.  HENT: Head: Normocephalic.  Right Ear: External ear normal.  Left Ear: External ear normal.  Bilat tm's mild erythema.  Sinus nontender.  Pharynx mild erythema Eyes: Conjunctivae and EOM are normal. Pupils are equal, round, and reactive to light.  Neck: Normal range of motion. Neck supple.  Cardiovascular: Normal rate and regular rhythm.   Pulmonary/Chest: Effort normal and breath sounds normal.  Abd:  Soft, NT, non-distended, + BS except for mild low mid abd tender Skin: Skin is warm. No erythema.  Psychiatric: Pt behavior is normal. Thought content normal. 1+ nervous    Assessment & Plan:

## 2011-04-24 NOTE — Assessment & Plan Note (Signed)
Mild to mod, for zyrtec asd prn,  to f/u any worsening symptoms or concerns 

## 2011-04-24 NOTE — Assessment & Plan Note (Signed)
Mild to mod, for antibx course,  to f/u any worsening symptoms or concerns, for urine studies

## 2011-04-28 ENCOUNTER — Other Ambulatory Visit (INDEPENDENT_AMBULATORY_CARE_PROVIDER_SITE_OTHER): Payer: Medicare Other

## 2011-04-28 ENCOUNTER — Encounter (INDEPENDENT_AMBULATORY_CARE_PROVIDER_SITE_OTHER): Payer: Medicare Other

## 2011-04-28 DIAGNOSIS — E039 Hypothyroidism, unspecified: Secondary | ICD-10-CM | POA: Diagnosis not present

## 2011-04-28 DIAGNOSIS — I714 Abdominal aortic aneurysm, without rupture, unspecified: Secondary | ICD-10-CM

## 2011-04-28 DIAGNOSIS — E785 Hyperlipidemia, unspecified: Secondary | ICD-10-CM | POA: Diagnosis not present

## 2011-04-28 DIAGNOSIS — I2510 Atherosclerotic heart disease of native coronary artery without angina pectoris: Secondary | ICD-10-CM | POA: Diagnosis not present

## 2011-04-28 DIAGNOSIS — I10 Essential (primary) hypertension: Secondary | ICD-10-CM

## 2011-04-28 DIAGNOSIS — I700 Atherosclerosis of aorta: Secondary | ICD-10-CM

## 2011-04-28 LAB — HEPATIC FUNCTION PANEL
AST: 33 U/L (ref 0–37)
Alkaline Phosphatase: 79 U/L (ref 39–117)
Bilirubin, Direct: 0 mg/dL (ref 0.0–0.3)
Total Protein: 7.5 g/dL (ref 6.0–8.3)

## 2011-04-28 LAB — LIPID PANEL
Cholesterol: 173 mg/dL (ref 0–200)
LDL Cholesterol: 116 mg/dL — ABNORMAL HIGH (ref 0–99)

## 2011-05-09 ENCOUNTER — Other Ambulatory Visit: Payer: Self-pay

## 2011-05-09 DIAGNOSIS — I714 Abdominal aortic aneurysm, without rupture, unspecified: Secondary | ICD-10-CM

## 2011-05-09 DIAGNOSIS — E7800 Pure hypercholesterolemia, unspecified: Secondary | ICD-10-CM

## 2011-05-30 ENCOUNTER — Encounter: Payer: Self-pay | Admitting: Vascular Surgery

## 2011-05-31 ENCOUNTER — Encounter: Payer: Self-pay | Admitting: Vascular Surgery

## 2011-05-31 ENCOUNTER — Ambulatory Visit (INDEPENDENT_AMBULATORY_CARE_PROVIDER_SITE_OTHER): Payer: Medicare Other | Admitting: Vascular Surgery

## 2011-05-31 VITALS — BP 120/83 | HR 71 | Resp 18 | Ht 66.0 in | Wt 238.0 lb

## 2011-05-31 DIAGNOSIS — I714 Abdominal aortic aneurysm, without rupture, unspecified: Secondary | ICD-10-CM | POA: Diagnosis not present

## 2011-05-31 NOTE — Progress Notes (Signed)
[facility]   Patient name: [name] MRN: [id number] DOB: 01-Dec-1945 Sex: female   Referred by: Riley Kill  Reason for referral:  Chief Complaint  Patient presents with  . AAA    new AAA REFERRED BY DR Riley Kill    HISTORY OF PRESENT ILLNESS: The patient presents today for evaluation of abdominal aortic aneurysm. This apparently was found time of evaluation for degenerative disc disease in her back. She reports no symptoms related to this. I have report of her ultrasound from 04/28/2011. This was compared to another ultrasound in January 2012. He specifically denies any abdominal discomfort associated with this. She does have an extensive past history to include coronary disease. She denies history of stroke. She denies any history of arterial insufficiency.  Past Medical History  Diagnosis Date  . Coronary artery disease     PCI of the left circumflex and LAD  . Hyperlipidemia   . Hypertension   . Obesity, unspecified     MODERATE  . Thyroid disease     HYPOTHYROIDISM  . Diabetes mellitus     TYPE II  . Anxiety state, unspecified   . Depressive disorder, not elsewhere classified   . Unspecified menopausal and postmenopausal disorder   . Simple or unspecified chronic serous otitis media   . Degeneration of lumbar or lumbosacral intervertebral disc   . Allergic rhinitis, cause unspecified   . Hx of hysterectomy   . Lower GI bleed 07/23/2010  . Chronic LBP     Past Surgical History  Procedure Date  . Total abdominal hysterectomy   . Lumbar fusion 01/2007    DR. Noel Gerold...3-LEVEL WITH FIXATION  . Parathyroidectomy   . Thyroidectomy   . Cardiac catheterization     PCI OF BOTH THE CIRCUMFLEX AND LEFT ANTERIOR DESCENDING ARTERY  . Cesarean section   . Heart stent 04-2010    X2    History   Social History  . Marital Status: Divorced    Spouse Name: N/A    Number of Children: 3  . Years of Education: N/A   Occupational History  . 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     Social History Main Topics  . Smoking status: Former Smoker -- 30 years    Types: Cigarettes    Quit date: 05/31/1986  . Smokeless tobacco: Never Used  . Alcohol Use: No  . Drug Use: No  . Sexually Active: Not on file   Other Topics Concern  . Not on file   Social History Narrative   DIVORCED3 CHILDRENPATIENT SIGNED A DESIGNATED PARTY RELEASE TO ALLOW HER DAUGHTER, TRAMAINE Javed, TO HAVE ACCESS TO HER MEDICAL RECORDS/INFORMATION. Daphane Shepherd, May 04, 2009 @ 3:27 PMSmokes cigarettes on rare occasions.    Family History  Problem Relation Age of Onset  . Heart attack Mother 38    s/p D&C-CARDIAC ARREST 1966  . Heart disease Mother   . Heart attack Father 52    1978 WITH MI  . Heart disease Father   . Colon cancer Neg Hx     Allergies as of 05/31/2011 - Review Complete 05/31/2011  Allergen Reaction Noted  . Doxycycline  10/29/2007  . Miconazole nitrate  09/23/2009  . Prilosec (omeprazole) Other (See Comments) 04/29/2010    Current Outpatient Prescriptions on File Prior to Visit  Medication Sig Dispense Refill  . aspirin EC 81 MG tablet Take4 times a week      .  atenolol (TENORMIN) 25 MG tablet TAKE 1 TABLET BY MOUTH TWICE DAILY  180 tablet  3  . cetirizine (ZYRTEC) 10 MG tablet Take 1 tablet (10 mg total) by mouth daily.  90 tablet  3  . HYDROcodone-acetaminophen (NORCO) 10-325 MG per tablet TAKE 1 TABLET BY MOUTH AT BEDTIME AS NEEDED FOR PAIN  30 tablet  2  . metFORMIN (GLUCOPHAGE-XR) 500 MG 24 hr tablet TAKE 3 TABLETS BY MOUTH EVERY MORNING  270 tablet  3  . nitroGLYCERIN (NITROSTAT) 0.4 MG SL tablet Place 1 tablet (0.4 mg total) under the tongue every 5 (five) minutes as needed.  25 tablet  2  . omeprazole (PRILOSEC OTC) 20 MG tablet Take 20 mg by mouth as needed.       . rosuvastatin (CRESTOR) 20 MG tablet Take 1 tablet (20 mg total) by mouth daily.  90 tablet  3  . SYNTHROID 150 MCG tablet TAKE 1 TABLET BY MOUTH EVERY  DAY  90 tablet  2  . Diazepam (VALIUM PO) Take 1 tablet by mouth as needed.        [name] HYDROcodone-acetaminophen (NORCO) 10-325 MG per tablet Take 1 tablet by mouth at bedtime as needed for pain.  30 tablet  3  . HYDROcodone-acetaminophen (NORCO) 10-325 MG per tablet Take 1 tablet by mouth every 6 (six) hours as needed for pain.  60 tablet  1  . DISCONTD: aspirin EC 81 MG tablet Take 1 tablet (81 mg total) by mouth daily.  150 tablet  2     REVIEW OF SYSTEMS:  Positives indicated with an "X"  CARDIOVASCULAR:  [ ]  chest pain   [ ]  chest pressure   [ ]  palpitations   [ ]  orthopnea   [ ]  dyspnea on exertion   [ ]  claudication   [ ]  rest pain   [ ]  DVT   [ ]  phlebitis PULMONARY:   [ ]  productive cough   [ ]  asthma   [ ]  wheezing NEUROLOGIC:   [ ]  weakness  [ ]  paresthesias  [ ]  aphasia  [ ]  amaurosis  [ ]  dizziness HEMATOLOGIC:   [ ]  bleeding problems   [ ]  clotting disorders MUSCULOSKELETAL:  [ ]  joint pain   [ ]  joint swelling GASTROINTESTINAL: [ ]   blood in stool  [ ]   hematemesis GENITOURINARY:  [ ]   dysuria  [ ]   hematuria PSYCHIATRIC:  [ ]  history of major depression INTEGUMENTARY:  [ ]  rashes  [ ]  ulcers CONSTITUTIONAL:  [ ]  fever   [ ]  chills  PHYSICAL EXAMINATION:  General: The patient is a well-nourished female, in no acute distress. Vital signs are BP 120/83  Pulse 71  Resp 18  Ht 5\' 6"  (1.676 m)  Wt 238 lb (107.956 kg)  BMI 38.41 kg/m2 Pulmonary: There is a good air exchange bilaterally without wheezing or rales. Abdomen: Soft and non-tender with normal pitch bowel sounds. I do not palpate an aneurysm. No masses noted Musculoskeletal: There are no major deformities.  There is no significant extremity pain. Neurologic: No focal weakness or paresthesias are detected, Skin: There are no ulcer or rashes noted. Psychiatric: The patient has normal affect. Cardiovascular: There is a regular rate and rhythm without significant murmur appreciated. Carotid arteries: No bruits  bilaterally   Vascular Lab Studies:  Ultrasound report reviewed from April 2013  Impression and Plan:  Asymptomatic infrarenal abdominal aortic aneurysm with no significant change from January 2012 April 2013. I discussed the significance of  this at length with the patient. I explained symptoms of leaking aneurysm and she knows to report immediately should this occur to the emergency room. Explain the usual slow progression over time and the importance of yearly ultrasound surveillance. I did discuss standard open and stent graft repair treatment for aneurysms should she region a size that was of concern. She follows yearly ultrasounds at [facility] heart care. She will continue this and see [place] again should she have appreciable growth.    Artrell Lawless [facility] [place] Office: [phone]

## 2011-06-14 ENCOUNTER — Other Ambulatory Visit (INDEPENDENT_AMBULATORY_CARE_PROVIDER_SITE_OTHER): Payer: Medicare Other

## 2011-06-14 DIAGNOSIS — I714 Abdominal aortic aneurysm, without rupture, unspecified: Secondary | ICD-10-CM

## 2011-06-14 DIAGNOSIS — E7800 Pure hypercholesterolemia, unspecified: Secondary | ICD-10-CM

## 2011-06-14 LAB — HEPATIC FUNCTION PANEL
ALT: 26 U/L (ref 0–35)
Bilirubin, Direct: 0 mg/dL (ref 0.0–0.3)
Total Bilirubin: 0.4 mg/dL (ref 0.3–1.2)

## 2011-06-14 LAB — LIPID PANEL: VLDL: 13.6 mg/dL (ref 0.0–40.0)

## 2011-06-15 ENCOUNTER — Encounter: Payer: Self-pay | Admitting: Cardiology

## 2011-06-15 ENCOUNTER — Ambulatory Visit (INDEPENDENT_AMBULATORY_CARE_PROVIDER_SITE_OTHER): Payer: Medicare Other | Admitting: Cardiology

## 2011-06-15 VITALS — BP 122/74 | HR 77 | Ht 66.0 in | Wt 237.0 lb

## 2011-06-15 DIAGNOSIS — I714 Abdominal aortic aneurysm, without rupture, unspecified: Secondary | ICD-10-CM | POA: Diagnosis not present

## 2011-06-15 DIAGNOSIS — I2510 Atherosclerotic heart disease of native coronary artery without angina pectoris: Secondary | ICD-10-CM

## 2011-06-15 DIAGNOSIS — E785 Hyperlipidemia, unspecified: Secondary | ICD-10-CM | POA: Diagnosis not present

## 2011-06-15 DIAGNOSIS — F17200 Nicotine dependence, unspecified, uncomplicated: Secondary | ICD-10-CM

## 2011-06-15 NOTE — Assessment & Plan Note (Signed)
Encouraged her to remain smoke free.  Importance of infarct prevention reviewed.

## 2011-06-15 NOTE — Progress Notes (Signed)
HPI:  Reason for followup. In general, she is doing quite well. She has occasional twinges, but nothing sustained. She saw Dr. Arbie Cookey, and we reviewed her studies today with regard to her abdominal aneurysm.  We also reviewed her lipid numbers in detail in the office today. She has had no prolonged chest pain. In general, she says that she stop smoking, but occasionally when she gets upset we'll continue to smoke again.  Current Outpatient Prescriptions  Medication Sig Dispense Refill  . aspirin EC 81 MG tablet Take4 times a week      . atenolol (TENORMIN) 25 MG tablet TAKE 1 TABLET BY MOUTH TWICE DAILY  180 tablet  3  . Diazepam (VALIUM PO) Take 1 tablet by mouth as needed.        [name] HYDROcodone-acetaminophen (NORCO) 10-325 MG per tablet TAKE 1 TABLET BY MOUTH AT BEDTIME AS NEEDED FOR PAIN  30 tablet  2  . metFORMIN (GLUCOPHAGE-XR) 500 MG 24 hr tablet TAKE 3 TABLETS BY MOUTH EVERY MORNING  270 tablet  3  . nitroGLYCERIN (NITROSTAT) 0.4 MG SL tablet Place 1 tablet (0.4 mg total) under the tongue every 5 (five) minutes as needed.  25 tablet  2  . omeprazole (PRILOSEC OTC) 20 MG tablet Take 20 mg by mouth as needed.       . rosuvastatin (CRESTOR) 20 MG tablet Take 1 tablet (20 mg total) by mouth daily.  90 tablet  3  . SYNTHROID 150 MCG tablet TAKE 1 TABLET BY MOUTH EVERY DAY  90 tablet  2  . cetirizine (ZYRTEC) 10 MG tablet Take 1 tablet (10 mg total) by mouth daily.  90 tablet  3  . HYDROcodone-acetaminophen (NORCO) 10-325 MG per tablet Take 1 tablet by mouth at bedtime as needed for pain.  30 tablet  3  . HYDROcodone-acetaminophen (NORCO) 10-325 MG per tablet Take 1 tablet by mouth every 6 (six) hours as needed for pain.  60 tablet  1  . DISCONTD: aspirin EC 81 MG tablet Take 1 tablet (81 mg total) by mouth daily.  150 tablet  2    Allergies  Allergen Reactions  . Doxycycline     REACTION: gi upset  . Miconazole Nitrate     REACTION: hives  . Prilosec (Omeprazole) Other (See Comments)   Chest pain    Past Medical History  Diagnosis Date  . Coronary artery disease     PCI of the left circumflex and LAD  . Hyperlipidemia   . Hypertension   . Obesity, unspecified     MODERATE  . Thyroid disease     HYPOTHYROIDISM  . Diabetes mellitus     TYPE II  . Anxiety state, unspecified   . Depressive disorder, not elsewhere classified   . Unspecified menopausal and postmenopausal disorder   . Simple or unspecified chronic serous otitis media   . Degeneration of lumbar or lumbosacral intervertebral disc   . Allergic rhinitis, cause unspecified   . Hx of hysterectomy   . Lower GI bleed 07/23/2010  . Chronic LBP     Past Surgical History  Procedure Date  . Total abdominal hysterectomy   . Lumbar fusion 01/2007    DR. Noel Gerold...3-LEVEL WITH FIXATION  . Parathyroidectomy   . Thyroidectomy   . Cardiac catheterization     PCI OF BOTH THE CIRCUMFLEX AND LEFT ANTERIOR DESCENDING ARTERY  . Cesarean section   . Heart stent 04-2010    X2    Family History  Problem  Relation Age of Onset  . Heart attack Mother 5    s/p D&C-CARDIAC ARREST 1966  . Heart disease Mother   . Heart attack Father 84    1978 WITH MI  . Heart disease Father   . Colon cancer Neg Hx     History   Social History  . Marital Status: Divorced    Spouse Name: N/A    Number of Children: 3  . Years of Education: N/A   Occupational History  . 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     Social History Main Topics  . Smoking status: Former Smoker -- 30 years    Types: Cigarettes    Quit date: 05/31/1986  . Smokeless tobacco: Never Used  . Alcohol Use: No  . Drug Use: No  . Sexually Active: Not on file   Other Topics Concern  . Not on file   Social History Narrative   DIVORCED3 CHILDRENPATIENT SIGNED A DESIGNATED PARTY RELEASE TO ALLOW HER DAUGHTER, TRAMAINE Souders, TO HAVE ACCESS TO HER MEDICAL RECORDS/INFORMATION. Daphane Shepherd, May 04, 2009 @ 3:27 PMSmokes cigarettes on rare  occasions.    ROS: Please see the HPI.  All other systems reviewed and negative.  PHYSICAL EXAM:  BP 122/74  Pulse 77  Ht 5\' 6"  (1.676 m)  Wt 237 lb (107.502 kg)  BMI 38.25 kg/m2  General: Well developed, well nourished, in no acute distress. Head:  Normocephalic and atraumatic. Neck: no JVD Lungs: Clear to auscultation and percussion. Heart: Normal S1 and S2.  No murmur, rubs or gallops.  Pulses: Pulses normal in all 4 extremities. Extremities: No clubbing or cyanosis. No edema. Neurologic: Alert and oriented x 3.  EKG:  NSR.  Left axis deviation.    ASSESSMENT AND PLAN:

## 2011-06-15 NOTE — Assessment & Plan Note (Signed)
Results of AAA imaging reviewed.  She has seen Dr. Arbie Cookey.  Repeat imaging in 4/14 recommended.  TS

## 2011-06-15 NOTE — Assessment & Plan Note (Signed)
No significant symptoms at present.  Continue medical therapy.

## 2011-06-15 NOTE — Assessment & Plan Note (Signed)
She is currently at target with normal LFTs.  Reviewed with patient in detail.

## 2011-06-15 NOTE — Patient Instructions (Signed)
Your physician wants you to follow-up in: 4 MONTHS.  You will receive a reminder letter in the mail two months in advance. If you don't receive a letter, please call our office to schedule the follow-up appointment.  Your physician recommends that you continue on your current medications as directed. Please refer to the Current Medication list given to you today.  

## 2011-06-16 ENCOUNTER — Ambulatory Visit: Payer: Medicare Other | Admitting: Cardiology

## 2011-06-22 ENCOUNTER — Telehealth: Payer: Self-pay | Admitting: Internal Medicine

## 2011-06-22 NOTE — Telephone Encounter (Signed)
PT WOULD LIKE TO BE WORKED IN FOR AN EAR INFECTION.

## 2011-06-22 NOTE — Telephone Encounter (Signed)
Ok for [employer]

## 2011-06-23 ENCOUNTER — Ambulatory Visit (INDEPENDENT_AMBULATORY_CARE_PROVIDER_SITE_OTHER): Payer: Medicare Other | Admitting: Internal Medicine

## 2011-06-23 ENCOUNTER — Other Ambulatory Visit (INDEPENDENT_AMBULATORY_CARE_PROVIDER_SITE_OTHER): Payer: Medicare Other

## 2011-06-23 ENCOUNTER — Encounter: Payer: Self-pay | Admitting: Internal Medicine

## 2011-06-23 VITALS — BP 110/68 | HR 80 | Temp 99.0°F | Ht 66.0 in | Wt 237.1 lb

## 2011-06-23 DIAGNOSIS — R300 Dysuria: Secondary | ICD-10-CM

## 2011-06-23 DIAGNOSIS — J019 Acute sinusitis, unspecified: Secondary | ICD-10-CM

## 2011-06-23 DIAGNOSIS — M545 Low back pain, unspecified: Secondary | ICD-10-CM

## 2011-06-23 DIAGNOSIS — G8929 Other chronic pain: Secondary | ICD-10-CM

## 2011-06-23 DIAGNOSIS — I10 Essential (primary) hypertension: Secondary | ICD-10-CM

## 2011-06-23 LAB — URINALYSIS, ROUTINE W REFLEX MICROSCOPIC
Specific Gravity, Urine: 1.015 (ref 1.000–1.030)
Total Protein, Urine: NEGATIVE
Urine Glucose: NEGATIVE

## 2011-06-23 MED ORDER — HYDROCODONE-ACETAMINOPHEN 10-500 MG PO TABS: 1.0000 | ORAL_TABLET | Freq: Two times a day (BID) | ORAL | Status: DC | PRN

## 2011-06-23 MED ORDER — KETOROLAC TROMETHAMINE 30 MG/ML IJ SOLN
30.0000 mg | Freq: Once | INTRAMUSCULAR | Status: AC
  Administered 2011-06-23: 30 mg via INTRAMUSCULAR

## 2011-06-23 MED ORDER — LEVOFLOXACIN 250 MG PO TABS: 250.0000 mg | ORAL_TABLET | Freq: Every day | ORAL | Status: AC

## 2011-06-23 NOTE — Telephone Encounter (Signed)
COMING AT 10 THIS AM.

## 2011-06-23 NOTE — Patient Instructions (Signed)
You had the toradol pain shot today Take all new medications as prescribed - the antibiotic, and pain medication Continue all other medications as before Please have the pharmacy call with any refills you may need. Please go to LAB in the Basement for the urine tests to be done today You will be contacted by phone if any changes need to be made immediately.  Otherwise, you will receive a letter about your results with an explanation.

## 2011-06-25 ENCOUNTER — Encounter: Payer: Self-pay | Admitting: Internal Medicine

## 2011-06-25 NOTE — Assessment & Plan Note (Signed)
Mild to mod, for antibx course,  to f/u any worsening symptoms or concerns 

## 2011-06-25 NOTE — Assessment & Plan Note (Signed)
stable overall by hx and exam, most recent data reviewed with pt, and pt to continue medical treatment as before 

## 2011-06-25 NOTE — Assessment & Plan Note (Signed)
stable overall by hx and exam, most recent data reviewed with pt, and pt to continue medical treatment as before BP Readings from Last 3 Encounters:  06/23/11 110/68  06/15/11 122/74  05/31/11 120/83

## 2011-06-25 NOTE — Progress Notes (Signed)
Subjective:    Patient ID: [name], female    DOB: 12-04-1945, 66 y.o.   MRN: [id number]  HPI   Here with 3 days acute onset fever, facial pain, pressure, general weakness and malaise, and greenish d/c, with slight ST, but little to no cough and Pt denies chest pain, increased sob or doe, wheezing, orthopnea, PND, increased LE swelling, palpitations, dizziness or syncope.  Also incidentally with urinary symptoms of dysuria, but no frequency, urgency,or hematuria.  Pt denies new neurological symptoms such as new headache, or facial or extremity weakness or numbness   Pt denies polydipsia, polyuria.  Denies worsening depressive symptoms, suicidal ideation, or panic, though has ongoing anxiety, not increased recently.  Pt continues to have recurring LBP without change in severity, bowel or bladder change, fever, wt loss,  worsening LE pain/numbness/weakness, gait change or falls. Past Medical History  Diagnosis Date  . Coronary artery disease     PCI of the left circumflex and LAD  . Hyperlipidemia   . Hypertension   . Obesity, unspecified     MODERATE  . Thyroid disease     HYPOTHYROIDISM  . Diabetes mellitus     TYPE II  . Anxiety state, unspecified   . Depressive disorder, not elsewhere classified   . Unspecified menopausal and postmenopausal disorder   . Simple or unspecified chronic serous otitis media   . Degeneration of lumbar or lumbosacral intervertebral disc   . Allergic rhinitis, cause unspecified   . Hx of hysterectomy   . Lower GI bleed 07/23/2010  . Chronic LBP    Past Surgical History  Procedure Date  . Total abdominal hysterectomy   . Lumbar fusion 01/2007    DR. Noel Gerold...3-LEVEL WITH FIXATION  . Parathyroidectomy   . Thyroidectomy   . Cardiac catheterization     PCI OF BOTH THE CIRCUMFLEX AND LEFT ANTERIOR DESCENDING ARTERY  . Cesarean section   . Heart stent 04-2010    X2    reports that she quit smoking about 25 years ago. Her smoking use included Cigarettes.  She quit after 30 years of use. She has never used smokeless tobacco. She reports that she does not drink alcohol or use illicit drugs. family history includes Heart attack (age of onset:40) in her mother; Heart attack (age of onset:52) in her father; and Heart disease in her father and mother.  There is no history of Colon cancer. Allergies  Allergen Reactions  . Doxycycline     REACTION: gi upset  . Miconazole Nitrate     REACTION: hives  . Prilosec (Omeprazole) Other (See Comments)    Chest pain   Current Outpatient Prescriptions on File Prior to Visit  Medication Sig Dispense Refill  . aspirin EC 81 MG tablet Take4 times a week      . atenolol (TENORMIN) 25 MG tablet TAKE 1 TABLET BY MOUTH TWICE DAILY  180 tablet  3  . cetirizine (ZYRTEC) 10 MG tablet Take 1 tablet (10 mg total) by mouth daily.  90 tablet  3  . Diazepam (VALIUM PO) Take 1 tablet by mouth as needed.        . metFORMIN (GLUCOPHAGE-XR) 500 MG 24 hr tablet TAKE 3 TABLETS BY MOUTH EVERY MORNING  270 tablet  3  . nitroGLYCERIN (NITROSTAT) 0.4 MG SL tablet Place 1 tablet (0.4 mg total) under the tongue every 5 (five) minutes as needed.  25 tablet  2  . omeprazole (PRILOSEC OTC) 20 MG tablet Take 20 mg by  mouth as needed.       . rosuvastatin (CRESTOR) 20 MG tablet Take 1 tablet (20 mg total) by mouth daily.  90 tablet  3  . SYNTHROID 150 MCG tablet TAKE 1 TABLET BY MOUTH EVERY DAY  90 tablet  2  . DISCONTD: aspirin EC 81 MG tablet Take 1 tablet (81 mg total) by mouth daily.  150 tablet  2    Review of Systems Review of Systems  Constitutional: Negative for diaphoresis and unexpected weight change.  HENT: Negative for tinnitus.   Eyes: Negative for photophobia and visual disturbance.  Respiratory: Negative for choking and stridor.   Gastrointestinal: Negative for vomiting and blood in stool.  Musculoskeletal: Negative for gait problem.  Skin: Negative for color change and wound.  Neurological: Negative for tremors and  numbness.  Psychiatric/Behavioral: Negative for decreased concentration. The patient is not hyperactive.      Objective:   Physical Exam BP 110/68  Pulse 80  Temp(Src) 99 F (37.2 C) (Oral)  Ht 5\' 6"  (1.676 m)  Wt 237 lb 2 oz (107.559 kg)  BMI 38.27 kg/m2  SpO2 95% Physical Exam  VS noted, mild ill Constitutional: Pt appears well-developed and well-nourished.  HENT: Head: Normocephalic.  Right Ear: External ear normal.  Left Ear: External ear normal.  Bilat tm's mild erythema.  Sinus tender bilat.  Pharynx mild erythema Eyes: Conjunctivae and EOM are normal. Pupils are equal, round, and reactive to light.  Neck: Normal range of motion. Neck supple.  Cardiovascular: Normal rate and regular rhythm.   Pulmonary/Chest: Effort normal and breath sounds normal.  Abd:  Soft, NT, non-distended, + BS, no flank tender Neurological: Pt is alert. Not confused Skin: Skin is warm. No erythema.  Psychiatric: Pt behavior is normal. Thought content normal. 1+ nervous    Assessment & Plan:

## 2011-06-25 NOTE — Progress Notes (Deleted)
  Subjective:    Patient ID: [name], female    DOB: 01-27-1945, 66 y.o.   MRN: [id number]  HPI    Review of Systems     Objective:   Physical Exam        Assessment & Plan:

## 2011-06-25 NOTE — Assessment & Plan Note (Signed)
For urine studies,  Has antibix that likely would cover

## 2011-08-01 ENCOUNTER — Telehealth: Payer: Self-pay | Admitting: Cardiology

## 2011-08-01 DIAGNOSIS — M25549 Pain in joints of unspecified hand: Secondary | ICD-10-CM | POA: Diagnosis not present

## 2011-08-01 DIAGNOSIS — M19049 Primary osteoarthritis, unspecified hand: Secondary | ICD-10-CM | POA: Diagnosis not present

## 2011-08-01 NOTE — Telephone Encounter (Signed)
Received phone call from patient stated she woke up around 4:00 am to use bathroom.States when she got back in bed she had chest tightness with pain down left arm.States she took NTG x 2 and went back to sleep.States has had no pain since this morning.States she is was wanting to let Dr.Stuckey know.

## 2011-08-01 NOTE — Telephone Encounter (Signed)
Spoke with Dr.Stuckey he advised needs to see patient this Friday 08/05/11.Patient called no answer.Left message to call tomorrow to schedule appointment.

## 2011-08-01 NOTE — Telephone Encounter (Signed)
Pt took nitro last night for chest pain

## 2011-08-02 NOTE — Telephone Encounter (Signed)
Patient called appointment scheduled with Dr.Stuckey this Friday 08/05/11 at 9:30 am.

## 2011-08-05 ENCOUNTER — Encounter: Payer: Self-pay | Admitting: Cardiology

## 2011-08-05 ENCOUNTER — Ambulatory Visit (INDEPENDENT_AMBULATORY_CARE_PROVIDER_SITE_OTHER): Payer: Medicare Other | Admitting: Cardiology

## 2011-08-05 VITALS — BP 110/68 | HR 85 | Resp 18 | Ht 66.0 in | Wt 236.4 lb

## 2011-08-05 DIAGNOSIS — I10 Essential (primary) hypertension: Secondary | ICD-10-CM | POA: Diagnosis not present

## 2011-08-05 DIAGNOSIS — E785 Hyperlipidemia, unspecified: Secondary | ICD-10-CM | POA: Diagnosis not present

## 2011-08-05 DIAGNOSIS — I2510 Atherosclerotic heart disease of native coronary artery without angina pectoris: Secondary | ICD-10-CM

## 2011-08-05 NOTE — Assessment & Plan Note (Signed)
Takes Crestor, at times sporadically.  Discussed HDL raising trial with patient.

## 2011-08-05 NOTE — Patient Instructions (Addendum)
Your physician recommends that you continue on your current medications as directed. Please refer to the Current Medication list given to you today.  Your physician recommends that you schedule a follow-up appointment in: 1-2 WEEKS with Dr Riley Kill

## 2011-08-05 NOTE — Assessment & Plan Note (Signed)
One episode of chest pain relieved by NTG.  She is still smoking in part because of the stress.

## 2011-08-05 NOTE — Assessment & Plan Note (Signed)
Controlled.  

## 2011-08-05 NOTE — Progress Notes (Signed)
HPI:  Patient came in for followup. Several days ago she a prolonged episode of chest pain. She laid back down went to sleep after taking 2 nitroglycerin and she was groggy the next day. She's under a lot of stress at home. She's having to make carpi meds for her daughter. She also has had no further discomfort, but she does get little tight when she is very emotional.  Current Outpatient Prescriptions  Medication Sig Dispense Refill  . aspirin EC 81 MG tablet Take4 times a week      . atenolol (TENORMIN) 25 MG tablet TAKE 1 TABLET BY MOUTH TWICE DAILY  180 tablet  3  . cetirizine (ZYRTEC) 10 MG tablet Take 1 tablet (10 mg total) by mouth daily.  90 tablet  3  . Diazepam (VALIUM PO) Take 1 tablet by mouth as needed.        . etodolac (LODINE) 400 MG tablet Take 400 mg by mouth 2 (two) times daily.       [name] HYDROcodone-acetaminophen (NORCO) 10-325 MG per tablet       . metFORMIN (GLUCOPHAGE-XR) 500 MG 24 hr tablet TAKE 3 TABLETS BY MOUTH EVERY MORNING  270 tablet  3  . nitroGLYCERIN (NITROSTAT) 0.4 MG SL tablet Place 1 tablet (0.4 mg total) under the tongue every 5 (five) minutes as needed.  25 tablet  2  . omeprazole (PRILOSEC OTC) 20 MG tablet Take 20 mg by mouth as needed.       . rosuvastatin (CRESTOR) 20 MG tablet Take 1 tablet (20 mg total) by mouth daily.  90 tablet  3  . SYNTHROID 150 MCG tablet TAKE 1 TABLET BY MOUTH EVERY DAY  90 tablet  2    Allergies  Allergen Reactions  . Doxycycline     REACTION: gi upset  . Miconazole Nitrate     REACTION: hives  . Prilosec (Omeprazole) Other (See Comments)    Chest pain    Past Medical History  Diagnosis Date  . Coronary artery disease     PCI of the left circumflex and LAD  . Hyperlipidemia   . Hypertension   . Obesity, unspecified     MODERATE  . Thyroid disease     HYPOTHYROIDISM  . Diabetes mellitus     TYPE II  . Anxiety state, unspecified   . Depressive disorder, not elsewhere classified   . Unspecified menopausal and  postmenopausal disorder   . Simple or unspecified chronic serous otitis media   . Degeneration of lumbar or lumbosacral intervertebral disc   . Allergic rhinitis, cause unspecified   . Hx of hysterectomy   . Lower GI bleed 07/23/2010  . Chronic LBP     Past Surgical History  Procedure Date  . Total abdominal hysterectomy   . Lumbar fusion 01/2007    DR. Noel Gerold...3-LEVEL WITH FIXATION  . Parathyroidectomy   . Thyroidectomy   . Cardiac catheterization     PCI OF BOTH THE CIRCUMFLEX AND LEFT ANTERIOR DESCENDING ARTERY  . Cesarean section   . Heart stent 04-2010    X2    Family History  Problem Relation Age of Onset  . Heart attack Mother 69    s/p D&C-CARDIAC ARREST 1966  . Heart disease Mother   . Heart attack Father 14    1978 WITH MI  . Heart disease Father   . Colon cancer Neg Hx     History   Social History  . Marital Status: Divorced  Spouse Name: N/A    Number of Children: 3  . Years of Education: N/A   Occupational History  . 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     Social History Main Topics  . Smoking status: Former Smoker -- 30 years    Types: Cigarettes    Quit date: 05/31/1986  . Smokeless tobacco: Never Used  . Alcohol Use: No  . Drug Use: No  . Sexually Active: Not on file   Other Topics Concern  . Not on file   Social History Narrative   DIVORCED3 CHILDRENPATIENT SIGNED A DESIGNATED PARTY RELEASE TO ALLOW HER DAUGHTER, TRAMAINE Asher, TO HAVE ACCESS TO HER MEDICAL RECORDS/INFORMATION. Daphane Shepherd, May 04, 2009 @ 3:27 PMSmokes cigarettes on rare occasions.    ROS: Please see the HPI.  All other systems reviewed and negative.  PHYSICAL EXAM:  BP 110/68  Pulse 85  Resp 18  Ht 5\' 6"  (1.676 m)  Wt 236 lb 6.4 oz (107.23 kg)  BMI 38.16 kg/m2  SpO2 97%  General: Well developed, well nourished, in no acute distress. Head:  Normocephalic and atraumatic. Neck: no JVD Lungs: Clear to auscultation and percussion. Heart: Normal  S1 and S2.  No murmur, rubs or gallops.  Pulses: Pulses normal in all 4 extremities. Extremities: No clubbing or cyanosis. No edema. Neurologic: Alert and oriented x 3.  EKG:  NSR.  Left axis deviation.  No acute changes.    ASSESSMENT AND PLAN:

## 2011-08-08 DIAGNOSIS — M25549 Pain in joints of unspecified hand: Secondary | ICD-10-CM | POA: Diagnosis not present

## 2011-08-15 ENCOUNTER — Encounter: Payer: Self-pay | Admitting: Cardiology

## 2011-08-15 ENCOUNTER — Ambulatory Visit (INDEPENDENT_AMBULATORY_CARE_PROVIDER_SITE_OTHER): Payer: Medicare Other | Admitting: Cardiology

## 2011-08-15 VITALS — BP 114/74 | HR 80 | Ht 66.5 in | Wt 237.8 lb

## 2011-08-15 DIAGNOSIS — E785 Hyperlipidemia, unspecified: Secondary | ICD-10-CM

## 2011-08-15 DIAGNOSIS — I2510 Atherosclerotic heart disease of native coronary artery without angina pectoris: Secondary | ICD-10-CM

## 2011-08-15 DIAGNOSIS — H6520 Chronic serous otitis media, unspecified ear: Secondary | ICD-10-CM | POA: Diagnosis not present

## 2011-08-15 DIAGNOSIS — F17200 Nicotine dependence, unspecified, uncomplicated: Secondary | ICD-10-CM | POA: Diagnosis not present

## 2011-08-15 NOTE — Patient Instructions (Signed)
Your physician recommends that you schedule a follow-up appointment in: 2 MONTHS with Dr Stuckey  Your physician recommends that you continue on your current medications as directed. Please refer to the Current Medication list given to you today.  

## 2011-08-16 NOTE — Progress Notes (Signed)
HPI:  The patient is in for followup. She does not describe much in the way of increase or change in chest pain. Her major complaint is that of head and neck congestion she does have a little bit of an earache. Notably, there were not major cardiac complaints today.  Current Outpatient Prescriptions  Medication Sig Dispense Refill  . aspirin EC 81 MG tablet Take4 times a week      . atenolol (TENORMIN) 25 MG tablet TAKE 1 TABLET BY MOUTH TWICE DAILY  180 tablet  3  . cetirizine (ZYRTEC) 10 MG tablet Take 1 tablet (10 mg total) by mouth daily.  90 tablet  3  . Diazepam (VALIUM PO) Take 1 tablet by mouth as needed.        . etodolac (LODINE) 400 MG tablet Take 400 mg by mouth 2 (two) times daily as needed.       [name] HYDROcodone-acetaminophen (NORCO) 10-325 MG per tablet as needed.       . metFORMIN (GLUCOPHAGE-XR) 500 MG 24 hr tablet TAKE 3 TABLETS BY MOUTH EVERY MORNING  270 tablet  3  . nitroGLYCERIN (NITROSTAT) 0.4 MG SL tablet Place 1 tablet (0.4 mg total) under the tongue every 5 (five) minutes as needed.  25 tablet  2  . rosuvastatin (CRESTOR) 20 MG tablet Take 1 tablet (20 mg total) by mouth daily.  90 tablet  3  . SYNTHROID 150 MCG tablet TAKE 1 TABLET BY MOUTH EVERY DAY  90 tablet  2    Allergies  Allergen Reactions  . Doxycycline     REACTION: gi upset  . Miconazole Nitrate     REACTION: hives  . Prilosec (Omeprazole) Other (See Comments)    Chest pain    Past Medical History  Diagnosis Date  . Coronary artery disease     PCI of the left circumflex and LAD  . Hyperlipidemia   . Hypertension   . Obesity, unspecified     MODERATE  . Thyroid disease     HYPOTHYROIDISM  . Diabetes mellitus     TYPE II  . Anxiety state, unspecified   . Depressive disorder, not elsewhere classified   . Unspecified menopausal and postmenopausal disorder   . Simple or unspecified chronic serous otitis media   . Degeneration of lumbar or lumbosacral intervertebral disc   . Allergic  rhinitis, cause unspecified   . Hx of hysterectomy   . Lower GI bleed 07/23/2010  . Chronic LBP     Past Surgical History  Procedure Date  . Total abdominal hysterectomy   . Lumbar fusion 01/2007    DR. Noel Gerold...3-LEVEL WITH FIXATION  . Parathyroidectomy   . Thyroidectomy   . Cardiac catheterization     PCI OF BOTH THE CIRCUMFLEX AND LEFT ANTERIOR DESCENDING ARTERY  . Cesarean section   . Heart stent 04-2010    X2    Family History  Problem Relation Age of Onset  . Heart attack Mother 63    s/p D&C-CARDIAC ARREST 1966  . Heart disease Mother   . Heart attack Father 2    1978 WITH MI  . Heart disease Father   . Colon cancer Neg Hx     History   Social History  . Marital Status: Divorced    Spouse Name: N/A    Number of Children: 3  . Years of Education: N/A   Occupational History  . 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  Social History Main Topics  . Smoking status: Former Smoker -- 30 years    Types: Cigarettes    Quit date: 05/31/1986  . Smokeless tobacco: Never Used  . Alcohol Use: No  . Drug Use: No  . Sexually Active: Not on file   Other Topics Concern  . Not on file   Social History Narrative   DIVORCED3 CHILDRENPATIENT SIGNED A DESIGNATED PARTY RELEASE TO ALLOW HER DAUGHTER, TRAMAINE Wilinski, TO HAVE ACCESS TO HER MEDICAL RECORDS/INFORMATION. Daphane Shepherd, May 04, 2009 @ 3:27 PMSmokes cigarettes on rare occasions.    ROS: Please see the HPI.  All other systems reviewed and negative.  PHYSICAL EXAM:  BP 114/74  Pulse 80  Ht 5' 6.5" (1.689 m)  Wt 237 lb 12.8 oz (107.865 kg)  BMI 37.81 kg/m2  General: Well developed, well nourished, in no acute distress. Head:  Normocephalic and atraumatic. Throat with mild erythema, no exudate.  Bilateral TM with normal light reflex.  Some tenderness in right paratracheal LN chain.  Neck: no JVD Lungs: Clear to auscultation and percussion. Heart: Normal S1 and S2.  No murmur, rubs or gallops.    Pulses: Pulses normal in all 4 extremities. Extremities: No clubbing or cyanosis. No edema. Neurologic: Alert and oriented x 3.  EKG:  Not done  ASSESSMENT AND PLAN:

## 2011-08-21 NOTE — Assessment & Plan Note (Signed)
We continue to encourage her not to smoke. I think she mainly does this related to anxiety associated with some of the issues with her children. We talked extensively about this in the past, and had a very nice interaction. I've continued to encourage her to do the best she can.

## 2011-08-21 NOTE — Assessment & Plan Note (Signed)
She has complained of fullness in the ears. I did examine her ears today, and I was able to see light reflexes on both sides. He did not appear to be seriously engorged, but she does have some symptoms. I've encouraged her to call her primary care physician to follow this up. Additionally, she did have mild erythema in the throat but no obvious exudate. She's not had fever. Again, I encouraged her to call her primary.

## 2011-08-21 NOTE — Assessment & Plan Note (Signed)
When she remains on her prescribed medicines she usually is at target. I've encouraged her to continue to remain on therapy as it's important for prevention of the.

## 2011-08-21 NOTE — Assessment & Plan Note (Signed)
I have been following the patient quite closely. Her symptoms vary from visit the visit, and she does get somewhat anxious. She does have underlying coronary disease, and she always has potential for progression. As such, I will continue to follow her closely. She knows that should there be any change in status she should contact us immediately. Otherwise, she will remain on medical therapy.

## 2011-08-22 ENCOUNTER — Ambulatory Visit (INDEPENDENT_AMBULATORY_CARE_PROVIDER_SITE_OTHER): Payer: Medicare Other | Admitting: Internal Medicine

## 2011-08-22 ENCOUNTER — Other Ambulatory Visit (INDEPENDENT_AMBULATORY_CARE_PROVIDER_SITE_OTHER): Payer: Medicare Other

## 2011-08-22 ENCOUNTER — Encounter: Payer: Self-pay | Admitting: Internal Medicine

## 2011-08-22 VITALS — BP 100/72 | HR 85 | Temp 98.1°F | Ht 66.0 in | Wt 235.5 lb

## 2011-08-22 DIAGNOSIS — E119 Type 2 diabetes mellitus without complications: Secondary | ICD-10-CM

## 2011-08-22 DIAGNOSIS — H6520 Chronic serous otitis media, unspecified ear: Secondary | ICD-10-CM

## 2011-08-22 DIAGNOSIS — I10 Essential (primary) hypertension: Secondary | ICD-10-CM | POA: Diagnosis not present

## 2011-08-22 DIAGNOSIS — F329 Major depressive disorder, single episode, unspecified: Secondary | ICD-10-CM | POA: Diagnosis not present

## 2011-08-22 DIAGNOSIS — M545 Low back pain, unspecified: Secondary | ICD-10-CM

## 2011-08-22 DIAGNOSIS — G8929 Other chronic pain: Secondary | ICD-10-CM

## 2011-08-22 DIAGNOSIS — R300 Dysuria: Secondary | ICD-10-CM

## 2011-08-22 DIAGNOSIS — F3289 Other specified depressive episodes: Secondary | ICD-10-CM

## 2011-08-22 LAB — CBC WITH DIFFERENTIAL/PLATELET
Basophils Absolute: 0 10*3/uL (ref 0.0–0.1)
Basophils Relative: 0.3 % (ref 0.0–3.0)
HCT: 44.8 % (ref 36.0–46.0)
Hemoglobin: 14.4 g/dL (ref 12.0–15.0)
Lymphs Abs: 2.7 10*3/uL (ref 0.7–4.0)
Monocytes Relative: 6.6 % (ref 3.0–12.0)
Neutro Abs: 5.1 10*3/uL (ref 1.4–7.7)
RBC: 5.15 Mil/uL — ABNORMAL HIGH (ref 3.87–5.11)
RDW: 13.6 % (ref 11.5–14.6)

## 2011-08-22 LAB — BASIC METABOLIC PANEL
Calcium: 9 mg/dL (ref 8.4–10.5)
GFR: 93.64 mL/min (ref >60.00)
Glucose, Bld: 118 mg/dL — ABNORMAL HIGH (ref 70–99)
Sodium: 139 mEq/L (ref 135–145)

## 2011-08-22 LAB — HEPATIC FUNCTION PANEL
Albumin: 3.9 g/dL (ref 3.5–5.2)
Alkaline Phosphatase: 90 U/L (ref 39–117)
Bilirubin, Direct: 0.1 mg/dL (ref 0.0–0.3)

## 2011-08-22 LAB — POCT URINALYSIS DIPSTICK
Glucose, UA: NEGATIVE
Ketones, UA: NEGATIVE
Spec Grav, UA: 1.01
Urobilinogen, UA: 0.2

## 2011-08-22 LAB — LIPID PANEL
HDL: 38.6 mg/dL — ABNORMAL LOW (ref >39.00)
Total CHOL/HDL Ratio: 3
VLDL: 30.6 mg/dL (ref 0.0–40.0)

## 2011-08-22 MED ORDER — FLUTICASONE PROPIONATE 50 MCG/ACT NA SUSP: 2.0000 | Freq: Every day | NASAL | Status: DC

## 2011-08-22 MED ORDER — CYCLOBENZAPRINE HCL 5 MG PO TABS: 5.0000 mg | ORAL_TABLET | Freq: Three times a day (TID) | ORAL | Status: AC | PRN

## 2011-08-22 MED ORDER — KETOROLAC TROMETHAMINE 30 MG/ML IJ SOLN
30.0000 mg | Freq: Once | INTRAMUSCULAR | Status: AC
  Administered 2011-08-22: 30 mg via INTRAMUSCULAR

## 2011-08-22 NOTE — Assessment & Plan Note (Signed)
With recent flare - for toradol 30 mg today as per last visit, ok to take nsaid prn as she has, and muscle relaxer prn,  to f/u any worsening symptoms or concerns, [facility] ortho referral

## 2011-08-22 NOTE — Patient Instructions (Addendum)
You had the pain shot today (toradol 30 mg)  We will send the urine for culture today Take all new medications as prescribed  - the muscle relaxer, generic zyrtec and flonase nasal spray Please go to LAB in the [facility] for the blood and/or urine tests to be done today You will be contacted by phone if any changes need to be made immediately.  Otherwise, you will receive a letter about your results with an explanation. Please call if you change your mind about medication for depression Please return in 6 months, or sooner if needed

## 2011-08-22 NOTE — Assessment & Plan Note (Signed)
Mild persistent, declines SSRI trial today

## 2011-08-22 NOTE — Assessment & Plan Note (Signed)
udip in the office neg today, but with ? Dysuria and back pain to check urine cx

## 2011-08-22 NOTE — Assessment & Plan Note (Signed)
stable overall by hx and exam, most recent data reviewed with pt, and pt to continue medical treatment as before BP Readings from Last 3 Encounters:  08/22/11 100/72  08/15/11 114/74  08/05/11 110/68

## 2011-08-22 NOTE — Progress Notes (Signed)
Subjective:    Patient ID: [name], female    DOB: 1945/07/29, 66 y.o.   MRN: [id number]  HPI  Here after recently seeing Dr Malka So, c/o bilat ear pain, c/w her ongoing chronic serous otitis, no fever but has not been taking her chronic sinus meds recently;  Has mild ST but mild no prod cough and Pt denies chest pain, increased sob or doe, wheezing, orthopnea, PND, increased LE swelling, palpitations, dizziness or syncope.  .Pt denies new neurological symptoms such as new headache, or facial or extremity weakness or numbness   Pt denies polydipsia, polyuria, or low sugar symptoms such as weakness or confusion improved with po intake.  Pt states overall good compliance with meds, trying to follow lower cholesterol, diabetic diet, wt overall stable but little exercise however, hard to lose wt.   Pt continues to have recurring LBP without recent change in severity, bowel or bladder change, fever, wt loss,  worsening LE pain/numbness/weakness, gait change or falls; did do very well with the toradol IM last visit for a short time, and not taking her nsaid at home, worried it might make something else worse.  Denies worsening reflux, dysphagia, abd pain, n/v, bowel change or blood, no hx of PUD or UGI bleeding.  Does have mild ongoing depressive symptoms but does not want specific tx for this such as SSRI, and no suicidal ideation, or panic, though has ongoing anxiety, not increased recently.  May have had mild dysuria in the past few days, but not today, and no abd pain, n/v, flank pain.   Past Medical History  Diagnosis Date  . Coronary artery disease     PCI of the left circumflex and LAD  . Hyperlipidemia   . Hypertension   . Obesity, unspecified     MODERATE  . Thyroid disease     HYPOTHYROIDISM  . Diabetes mellitus     TYPE II  . Anxiety state, unspecified   . Depressive disorder, not elsewhere classified   . Unspecified menopausal and postmenopausal disorder   . Simple or  unspecified chronic serous otitis media   . Degeneration of lumbar or lumbosacral intervertebral disc   . Allergic rhinitis, cause unspecified   . Hx of hysterectomy   . Lower GI bleed 07/23/2010  . Chronic LBP    Past Surgical History  Procedure Date  . Total abdominal hysterectomy   . Lumbar fusion 01/2007    DR. Noel Gerold...3-LEVEL WITH FIXATION  . Parathyroidectomy   . Thyroidectomy   . Cardiac catheterization     PCI OF BOTH THE CIRCUMFLEX AND LEFT ANTERIOR DESCENDING ARTERY  . Cesarean section   . Heart stent 04-2010    X2    reports that she quit smoking about 25 years ago. Her smoking use included Cigarettes. She quit after 30 years of use. She has never used smokeless tobacco. She reports that she does not drink alcohol or use illicit drugs. family history includes Heart attack (age of onset:40) in her mother; Heart attack (age of onset:52) in her father; and Heart disease in her father and mother.  There is no history of Colon cancer. Allergies  Allergen Reactions  . Doxycycline     REACTION: gi upset  . Miconazole Nitrate     REACTION: hives  . Prilosec (Omeprazole) Other (See Comments)    Chest pain   Review of Systems Constitutional: Negative for diaphoresis and unexpected weight change.  HENT: Negative for drooling and tinnitus.   Eyes: Negative for  photophobia and visual disturbance.  Respiratory: Negative for choking and stridor.   Gastrointestinal: Negative for vomiting and blood in stool.  Genitourinary: Negative for hematuria and decreased urine volume.  Musculoskeletal: Negative for worsening gait problem.  Skin: Negative for color change and wound.  Neurological: Negative for tremors and numbness.  Psychiatric/Behavioral: Negative for decreased concentration. The patient is not hyperactive.      Objective:   Physical Exam BP 100/72  Pulse 85  Temp 98.1 F (36.7 C) (Oral)  Ht 5\' 6"  (1.676 m)  Wt 235 lb 8 oz (106.822 kg)  BMI 38.01 kg/m2  SpO2  97% Physical Exam  VS noted, not acute ill appearingg Constitutional: Pt appears well-developed and well-nourished.  HENT: Head: Normocephalic.  Right Ear: External ear normal.  Left Ear: External ear normal.  Bilat tm's mild erythema with light reflex,.  Sinus nontender.  Pharynx mild erythema Eyes: Conjunctivae and EOM are normal. Pupils are equal, round, and reactive to light.  Neck: Normal range of motion. Neck supple.  Cardiovascular: Normal rate and regular rhythm.   Pulmonary/Chest: Effort normal and breath sounds normal.  Abd:  Soft, NT, non-distended, + BS Neurological: Pt is alert. Spine: diffuse mild lumbar tender, with mild bilat lumbar spasm today Skin: Skin is warm. No erythema. No rash Psychiatric: Pt behavior is normal. Thought content normal. 1-2+ nervous, + depressed affect    Assessment & Plan:

## 2011-08-22 NOTE — Assessment & Plan Note (Addendum)
For re-start zyrtec/flonase asd, consider ent referral - declines today

## 2011-08-22 NOTE — Assessment & Plan Note (Signed)
stable overall by hx and exam, most recent data reviewed with pt, and pt to continue medical treatment as before Lab Results  Component Value Date   HGBA1C 6.6* 02/21/2011

## 2011-08-23 DIAGNOSIS — M25549 Pain in joints of unspecified hand: Secondary | ICD-10-CM | POA: Diagnosis not present

## 2011-08-30 ENCOUNTER — Telehealth: Payer: Self-pay

## 2011-08-30 MED ORDER — NABUMETONE 500 MG PO TABS: 500.0000 mg | ORAL_TABLET | Freq: Two times a day (BID) | ORAL | Status: DC

## 2011-08-30 NOTE — Telephone Encounter (Signed)
Ok to stop the [employer] to change to generic Relafen 500 bid prn - done erx

## 2011-08-30 NOTE — Telephone Encounter (Signed)
unfort not, as the toradol comes in a shot only; and lodine is the same kind of med orally (nsaid)

## 2011-08-30 NOTE — Telephone Encounter (Signed)
Called the patient informed of MD's response.  She stated she would like another shot, but if can't do then could MD send something into pharmacy that would be close to the shot.

## 2011-08-30 NOTE — Telephone Encounter (Signed)
The patient called to inform the Toradol injection given at last OV made her feel wonderful.  She stated it made her pain go away without being sleepy like other pain meds do.  Please advise is there is a pill similar to take.  Please advise

## 2011-08-30 NOTE — Telephone Encounter (Signed)
Message copied by Pincus Sanes on Tue Aug 30, 2011 11:37 AM ------      Message from: Etheleen Sia      Created: Tue Aug 30, 2011 10:35 AM      Regarding: INJECTION       Ms Couse says she has an injection a few days ago.  She wants you to call her regarding it.  [phone]

## 2011-08-31 DIAGNOSIS — M25549 Pain in joints of unspecified hand: Secondary | ICD-10-CM | POA: Diagnosis not present

## 2011-08-31 NOTE — Telephone Encounter (Signed)
Called left message to call back 

## 2011-08-31 NOTE — Telephone Encounter (Signed)
Called the patient informed of medication sent to pharmacy and instructions on stopping lodine.

## 2011-10-19 ENCOUNTER — Encounter: Payer: Self-pay | Admitting: Cardiology

## 2011-10-19 ENCOUNTER — Ambulatory Visit (INDEPENDENT_AMBULATORY_CARE_PROVIDER_SITE_OTHER): Payer: Medicare Other | Admitting: Cardiology

## 2011-10-19 VITALS — BP 115/75 | HR 79 | Ht 66.0 in | Wt 236.0 lb

## 2011-10-19 DIAGNOSIS — I714 Abdominal aortic aneurysm, without rupture, unspecified: Secondary | ICD-10-CM | POA: Diagnosis not present

## 2011-10-19 DIAGNOSIS — E785 Hyperlipidemia, unspecified: Secondary | ICD-10-CM | POA: Diagnosis not present

## 2011-10-19 DIAGNOSIS — I10 Essential (primary) hypertension: Secondary | ICD-10-CM | POA: Diagnosis not present

## 2011-10-19 DIAGNOSIS — I2510 Atherosclerotic heart disease of native coronary artery without angina pectoris: Secondary | ICD-10-CM

## 2011-10-19 NOTE — Patient Instructions (Addendum)
Your physician recommends that you schedule a follow-up appointment in: 3 MONTHS  Your physician recommends that you continue on your current medications as directed. Please refer to the Current Medication list given to you today.   

## 2011-10-20 ENCOUNTER — Ambulatory Visit (INDEPENDENT_AMBULATORY_CARE_PROVIDER_SITE_OTHER): Payer: Medicare Other | Admitting: Internal Medicine

## 2011-10-20 VITALS — BP 102/62 | HR 92 | Temp 98.5°F | Ht 66.0 in | Wt 238.5 lb

## 2011-10-20 DIAGNOSIS — H6692 Otitis media, unspecified, left ear: Secondary | ICD-10-CM

## 2011-10-20 DIAGNOSIS — J309 Allergic rhinitis, unspecified: Secondary | ICD-10-CM

## 2011-10-20 DIAGNOSIS — E119 Type 2 diabetes mellitus without complications: Secondary | ICD-10-CM | POA: Diagnosis not present

## 2011-10-20 DIAGNOSIS — I10 Essential (primary) hypertension: Secondary | ICD-10-CM | POA: Diagnosis not present

## 2011-10-20 DIAGNOSIS — Z23 Encounter for immunization: Secondary | ICD-10-CM | POA: Diagnosis not present

## 2011-10-20 DIAGNOSIS — H6690 Otitis media, unspecified, unspecified ear: Secondary | ICD-10-CM

## 2011-10-20 MED ORDER — CEFTRIAXONE SODIUM 500 MG IJ SOLR
500.0000 mg | Freq: Once | INTRAMUSCULAR | Status: AC
  Administered 2011-10-20: 500 mg via INTRAMUSCULAR

## 2011-10-20 MED ORDER — HYDROCODONE-ACETAMINOPHEN 10-325 MG PO TABS: 1.0000 | ORAL_TABLET | Freq: Two times a day (BID) | ORAL | Status: DC | PRN

## 2011-10-20 MED ORDER — CEPHALEXIN 500 MG PO CAPS: 500.0000 mg | ORAL_CAPSULE | Freq: Four times a day (QID) | ORAL | Status: DC

## 2011-10-20 MED ORDER — KETOROLAC TROMETHAMINE 30 MG/ML IJ SOLN
30.0000 mg | Freq: Once | INTRAMUSCULAR | Status: AC
  Administered 2011-10-20: 30 mg via INTRAMUSCULAR

## 2011-10-20 MED ORDER — METHYLPREDNISOLONE ACETATE 80 MG/ML IJ SUSP
120.0000 mg | Freq: Once | INTRAMUSCULAR | Status: AC
  Administered 2011-10-20: 120 mg via INTRAMUSCULAR

## 2011-10-20 NOTE — Patient Instructions (Addendum)
You had the flu shot today, the pain shot, antibiotic shot, and steroid shot Take all new medications as prescribed - the pill antibiotic (ok to start tomorrow) Continue all other medications as before The hydrocodone refill is given hardcopy today Please have the pharmacy call with any other refills you may need.

## 2011-10-22 ENCOUNTER — Encounter: Payer: Self-pay | Admitting: Internal Medicine

## 2011-10-22 DIAGNOSIS — H6692 Otitis media, unspecified, left ear: Secondary | ICD-10-CM | POA: Insufficient documentation

## 2011-10-22 NOTE — Assessment & Plan Note (Signed)
stable overall by hx and exam, most recent data reviewed with pt, and pt to continue medical treatment as before Lab Results  Component Value Date   HGBA1C 7.1* 08/22/2011   

## 2011-10-22 NOTE — Progress Notes (Signed)
Subjective:    Patient ID: [name], female    DOB: [birth year], 66 y.o.   MRN: [id number]  HPI   Here with 3 days acute onset fever, facial pain, pressure, left ear pain/pressure, general weakness and malaise, and greenish d/c, with mild ST, but little to no cough and Pt denies chest pain, increased sob or doe, wheezing, orthopnea, PND, increased LE swelling, palpitations, dizziness or syncope.   Pt denies polydipsia, polyuria, or low sugar symptoms such as weakness or confusion improved with po intake.  Pt states overall good compliance with meds, trying to follow lower cholesterol, diabetic diet, wt overall stable but little exercise however.   Pt denies new neurological symptoms such as new headache, or facial or extremity weakness or numbness  For flu shot today Past Medical History  Diagnosis Date  . Coronary artery disease     PCI of the left circumflex and LAD  . Hyperlipidemia   . Hypertension   . Obesity, unspecified     MODERATE  . Thyroid disease     HYPOTHYROIDISM  . Diabetes mellitus     TYPE II  . Anxiety state, unspecified   . Depressive disorder, not elsewhere classified   . Unspecified menopausal and postmenopausal disorder   . Simple or unspecified chronic serous otitis media   . Degeneration of lumbar or lumbosacral intervertebral disc   . Allergic rhinitis, cause unspecified   . Hx of hysterectomy   . Lower GI bleed 07/23/2010  . Chronic LBP    Past Surgical History  Procedure Date  . Total abdominal hysterectomy   . Lumbar fusion 01/2007    DR. Noel Gerold...3-LEVEL WITH FIXATION  . Parathyroidectomy   . Thyroidectomy   . Cardiac catheterization     PCI OF BOTH THE CIRCUMFLEX AND LEFT ANTERIOR DESCENDING ARTERY  . Cesarean section   . Heart stent 04-2010    X2    reports that she quit smoking about 25 years ago. Her smoking use included Cigarettes. She quit after 30 years of use. She has never used smokeless tobacco. She reports that she does not drink alcohol  or use illicit drugs. family history includes Heart attack (age of onset:40) in her mother; Heart attack (age of onset:52) in her father; and Heart disease in her father and mother.  There is no history of Colon cancer. Allergies  Allergen Reactions  . Doxycycline     REACTION: gi upset  . Miconazole Nitrate     REACTION: hives  . Prilosec (Omeprazole) Other (See Comments)    Chest pain   Current Outpatient Prescriptions on File Prior to Visit  Medication Sig Dispense Refill  . aspirin EC 81 MG tablet Take4 times a week      . atenolol (TENORMIN) 25 MG tablet TAKE 1 TABLET BY MOUTH TWICE DAILY  180 tablet  3  . cetirizine (ZYRTEC) 10 MG tablet Take 1 tablet (10 mg total) by mouth daily.  90 tablet  3  . cyclobenzaprine (FLEXERIL) 5 MG tablet As needed  (muscle spams)      . metFORMIN (GLUCOPHAGE-XR) 500 MG 24 hr tablet TAKE 3 TABLETS BY MOUTH EVERY MORNING  270 tablet  3  . nabumetone (RELAFEN) 500 MG tablet Take 1 tablet (500 mg total) by mouth 2 (two) times daily. As needed for pain  60 tablet  5  . nitroGLYCERIN (NITROSTAT) 0.4 MG SL tablet Place 1 tablet (0.4 mg total) under the tongue every 5 (five) minutes as needed.  25  tablet  2  . rosuvastatin (CRESTOR) 20 MG tablet Take 1 tablet (20 mg total) by mouth daily.  90 tablet  3  . SYNTHROID 150 MCG tablet TAKE 1 TABLET BY MOUTH EVERY DAY  90 tablet  2   Review of Systems  Constitutional: Negative for diaphoresis and unexpected weight change.  HENT: Negative for tinnitus.   Eyes: Negative for photophobia and visual disturbance.  Respiratory: Negative for choking and stridor.   Gastrointestinal: Negative for vomiting and blood in stool.  Genitourinary: Negative for hematuria and decreased urine volume.  Musculoskeletal: Negative for gait problem.  Skin: Negative for color change and wound.  Neurological: Negative for tremors and numbness.  Psychiatric/Behavioral: Negative for decreased concentration. The patient is not hyperactive.        Objective:   Physical Exam BP 102/62  Pulse 92  Temp 98.5 F (36.9 C) (Oral)  Ht 5\' 6"  (1.676 m)  Wt 238 lb 8 oz (108.183 kg)  BMI 38.49 kg/m2  SpO2 95% Physical Exam  VS noted, mild ill Constitutional: Pt appears well-developed and well-nourished.  HENT: Head: Normocephalic.  Right Ear: External ear normal. Right TM mild erythema Left Ear: External ear normal. Left TM severe red/bulging with light reflex Eyes: Conjunctivae and EOM are normal. Pupils are equal, round, and reactive to light.  Neck: Normal range of motion. Neck supple.  Cardiovascular: Normal rate and regular rhythm.   Pulmonary/Chest: Effort normal and breath sounds normal.  Neurological: Pt is alert. Not confused  Skin: Skin is warm. No erythema.  Psychiatric: Pt behavior is normal. Thought content normal.     Assessment & Plan:

## 2011-10-22 NOTE — Assessment & Plan Note (Signed)
Mild to mod, for depomedrol IM,  to f/u any worsening symptoms or concerns 

## 2011-10-22 NOTE — Assessment & Plan Note (Signed)
stable overall by hx and exam, most recent data reviewed with pt, and pt to continue medical treatment as before BP Readings from Last 3 Encounters:  10/20/11 102/62  10/19/11 115/75  08/22/11 100/72

## 2011-10-22 NOTE — Assessment & Plan Note (Addendum)
Mod to severe, for antibx course after rocephin today,  to f/u any worsening symptoms or concerns

## 2011-10-26 ENCOUNTER — Other Ambulatory Visit: Payer: Self-pay | Admitting: Internal Medicine

## 2011-10-30 NOTE — Progress Notes (Signed)
HPI:  The patient returns today in followup. Overall she is doing pretty well. She has very occasional chest discomfort, but is only very intermittent. She denies progressive symptoms. When she has had problems in the past, and clearly evident.  Current Outpatient Prescriptions  Medication Sig Dispense Refill  . aspirin EC 81 MG tablet Take4 times a week      . atenolol (TENORMIN) 25 MG tablet TAKE 1 TABLET BY MOUTH TWICE DAILY  180 tablet  3  . cetirizine (ZYRTEC) 10 MG tablet Take 1 tablet (10 mg total) by mouth daily.  90 tablet  3  . cyclobenzaprine (FLEXERIL) 5 MG tablet As needed  (muscle spams)      . metFORMIN (GLUCOPHAGE-XR) 500 MG 24 hr tablet TAKE 3 TABLETS BY MOUTH EVERY MORNING  270 tablet  3  . nabumetone (RELAFEN) 500 MG tablet Take 1 tablet (500 mg total) by mouth 2 (two) times daily. As needed for pain  60 tablet  5  . nitroGLYCERIN (NITROSTAT) 0.4 MG SL tablet Place 1 tablet (0.4 mg total) under the tongue every 5 (five) minutes as needed.  25 tablet  2  . rosuvastatin (CRESTOR) 20 MG tablet Take 1 tablet (20 mg total) by mouth daily.  90 tablet  3  . cephALEXin (KEFLEX) 500 MG capsule Take 1 capsule (500 mg total) by mouth 4 (four) times daily.  40 capsule  0  . HYDROcodone-acetaminophen (NORCO) 10-325 MG per tablet Take 1 tablet by mouth 2 (two) times daily as needed.  60 tablet  1  . SYNTHROID 150 MCG tablet TAKE 1 TABLET BY MOUTH EVERY DAY  90 tablet  3    Allergies  Allergen Reactions  . Doxycycline     REACTION: gi upset  . Miconazole Nitrate     REACTION: hives  . Prilosec (Omeprazole) Other (See Comments)    Chest pain    Past Medical History  Diagnosis Date  . Coronary artery disease     PCI of the left circumflex and LAD  . Hyperlipidemia   . Hypertension   . Obesity, unspecified     MODERATE  . Thyroid disease     HYPOTHYROIDISM  . Diabetes mellitus     TYPE II  . Anxiety state, unspecified   . Depressive disorder, not elsewhere classified   .  Unspecified menopausal and postmenopausal disorder   . Simple or unspecified chronic serous otitis media   . Degeneration of lumbar or lumbosacral intervertebral disc   . Allergic rhinitis, cause unspecified   . Hx of hysterectomy   . Lower GI bleed 07/23/2010  . Chronic LBP     Past Surgical History  Procedure Date  . Total abdominal hysterectomy   . Lumbar fusion 01/2007    DR. Noel Gerold...3-LEVEL WITH FIXATION  . Parathyroidectomy   . Thyroidectomy   . Cardiac catheterization     PCI OF BOTH THE CIRCUMFLEX AND LEFT ANTERIOR DESCENDING ARTERY  . Cesarean section   . Heart stent 04-2010    X2    Family History  Problem Relation Age of Onset  . Heart attack Mother 81    s/p D&C-CARDIAC ARREST 1966  . Heart disease Mother   . Heart attack Father 15    1978 WITH MI  . Heart disease Father   . Colon cancer Neg Hx     History   Social History  . Marital Status: Divorced    Spouse Name: N/A    Number of Children: 3  .  Years of Education: N/A   Occupational History  . 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     Social History Main Topics  . Smoking status: Former Smoker -- 30 years    Types: Cigarettes    Quit date: 05/31/1986  . Smokeless tobacco: Never Used  . Alcohol Use: No  . Drug Use: No  . Sexually Active: Not on file   Other Topics Concern  . Not on file   Social History Narrative   DIVORCED3 CHILDRENPATIENT SIGNED A DESIGNATED PARTY RELEASE TO ALLOW HER DAUGHTER, TRAMAINE Mandley, TO HAVE ACCESS TO HER MEDICAL RECORDS/INFORMATION. Daphane Shepherd, May 04, 2009 @ 3:27 PMSmokes cigarettes on rare occasions.    ROS: Please see the HPI.  All other systems reviewed and negative.  PHYSICAL EXAM:  BP 115/75  Pulse 79  Ht 5\' 6"  (1.676 m)  Wt 236 lb (107.049 kg)  BMI 38.09 kg/m2  General: Well developed, well nourished, in no acute distress. Head:  Normocephalic and atraumatic. Neck: no JVD Lungs: Clear to auscultation and percussion. Heart: Normal  S1 and S2.  No murmur, rubs or gallops.  Pulses: Pulses normal in all 4 extremities. Extremities: No clubbing or cyanosis. No edema. Neurologic: Alert and oriented x 3.  EKG:   ASSESSMENT AND PLAN:

## 2011-10-30 NOTE — Assessment & Plan Note (Signed)
Stable AAA at this point.

## 2011-10-30 NOTE — Assessment & Plan Note (Signed)
Currently stable on a medical regimen.  Reminded not to smoke.

## 2011-10-30 NOTE — Assessment & Plan Note (Signed)
Stable at the present time.  Nicely controlled on a medical regimen.  TS

## 2011-10-30 NOTE — Assessment & Plan Note (Signed)
Currently at target.  

## 2011-10-31 ENCOUNTER — Telehealth: Payer: Self-pay

## 2011-10-31 MED ORDER — FLUCONAZOLE 150 MG PO TABS: ORAL_TABLET | ORAL | Status: DC

## 2011-10-31 NOTE — Telephone Encounter (Signed)
Please send in to the pharmacy something for a yeast infection.  [facility] [address]

## 2011-11-03 DIAGNOSIS — M545 Low back pain, unspecified: Secondary | ICD-10-CM | POA: Diagnosis not present

## 2011-11-03 DIAGNOSIS — M961 Postlaminectomy syndrome, not elsewhere classified: Secondary | ICD-10-CM | POA: Diagnosis not present

## 2011-11-03 DIAGNOSIS — G894 Chronic pain syndrome: Secondary | ICD-10-CM | POA: Diagnosis not present

## 2011-11-03 DIAGNOSIS — M5137 Other intervertebral disc degeneration, lumbosacral region: Secondary | ICD-10-CM | POA: Diagnosis not present

## 2011-11-25 ENCOUNTER — Other Ambulatory Visit: Payer: Self-pay | Admitting: Cardiology

## 2011-11-25 DIAGNOSIS — K922 Gastrointestinal hemorrhage, unspecified: Secondary | ICD-10-CM

## 2011-11-25 DIAGNOSIS — I10 Essential (primary) hypertension: Secondary | ICD-10-CM

## 2011-11-25 DIAGNOSIS — R079 Chest pain, unspecified: Secondary | ICD-10-CM

## 2011-11-25 DIAGNOSIS — E785 Hyperlipidemia, unspecified: Secondary | ICD-10-CM

## 2011-11-25 MED ORDER — NITROGLYCERIN 0.4 MG SL SUBL: 0.4000 mg | SUBLINGUAL_TABLET | SUBLINGUAL | Status: DC | PRN

## 2011-11-29 DIAGNOSIS — M5137 Other intervertebral disc degeneration, lumbosacral region: Secondary | ICD-10-CM | POA: Diagnosis not present

## 2011-11-29 DIAGNOSIS — M545 Low back pain, unspecified: Secondary | ICD-10-CM | POA: Diagnosis not present

## 2011-11-29 DIAGNOSIS — G894 Chronic pain syndrome: Secondary | ICD-10-CM | POA: Diagnosis not present

## 2011-12-01 ENCOUNTER — Encounter: Payer: Self-pay | Admitting: Internal Medicine

## 2011-12-01 ENCOUNTER — Ambulatory Visit (INDEPENDENT_AMBULATORY_CARE_PROVIDER_SITE_OTHER): Payer: Medicare Other | Admitting: Internal Medicine

## 2011-12-01 VITALS — BP 92/62 | HR 88 | Temp 98.5°F | Ht 66.0 in | Wt 238.8 lb

## 2011-12-01 DIAGNOSIS — G8929 Other chronic pain: Secondary | ICD-10-CM

## 2011-12-01 DIAGNOSIS — M545 Low back pain, unspecified: Secondary | ICD-10-CM | POA: Diagnosis not present

## 2011-12-01 DIAGNOSIS — J019 Acute sinusitis, unspecified: Secondary | ICD-10-CM | POA: Insufficient documentation

## 2011-12-01 DIAGNOSIS — E119 Type 2 diabetes mellitus without complications: Secondary | ICD-10-CM | POA: Diagnosis not present

## 2011-12-01 DIAGNOSIS — I10 Essential (primary) hypertension: Secondary | ICD-10-CM

## 2011-12-01 MED ORDER — FLUCONAZOLE 150 MG PO TABS: ORAL_TABLET | ORAL | Status: DC

## 2011-12-01 MED ORDER — METHYLPREDNISOLONE ACETATE 80 MG/ML IJ SUSP
120.0000 mg | Freq: Once | INTRAMUSCULAR | Status: AC
  Administered 2011-12-01: 120 mg via INTRAMUSCULAR

## 2011-12-01 MED ORDER — LEVOFLOXACIN 250 MG PO TABS: 250.0000 mg | ORAL_TABLET | Freq: Every day | ORAL | Status: DC

## 2011-12-01 NOTE — Assessment & Plan Note (Signed)
stable overall by hx and exam, most recent data reviewed with pt, and pt to continue medical treatment as before BP Readings from Last 3 Encounters:  12/01/11 92/62  10/20/11 102/62  10/19/11 115/75

## 2011-12-01 NOTE — Progress Notes (Signed)
Subjective:    Patient ID: [name], female    DOB: [birth year], 66 y.o.   MRN: [id number]  HPI   Here with 3 days acute onset fever, facial pain, pressure, general weakness and malaise, and greenish d/c, with slight ST, but little to no cough and Pt denies chest pain, increased sob or doe, wheezing, orthopnea, PND, increased LE swelling, palpitations, dizziness or syncope.  Pt continues to have recurring LBP without change in severity, bowel or bladder change, fever, wt loss,  worsening LE pain/numbness/weakness, gait change or falls.   Pt denies polydipsia, polyuria, or low sugar symptoms such as weakness or confusion improved with po intake.  Pt states overall good compliance with meds, trying to follow lower cholesterol, diabetic diet, wt overall stable but little exercise however.     Pt denies new neurological symptoms such as new headache, or facial or extremity weakness or numbness Past Medical History  Diagnosis Date  . Coronary artery disease     PCI of the left circumflex and LAD  . Hyperlipidemia   . Hypertension   . Obesity, unspecified     MODERATE  . Thyroid disease     HYPOTHYROIDISM  . Diabetes mellitus     TYPE II  . Anxiety state, unspecified   . Depressive disorder, not elsewhere classified   . Unspecified menopausal and postmenopausal disorder   . Simple or unspecified chronic serous otitis media   . Degeneration of lumbar or lumbosacral intervertebral disc   . Allergic rhinitis, cause unspecified   . Hx of hysterectomy   . Lower GI bleed 07/23/2010  . Chronic LBP    Past Surgical History  Procedure Date  . Total abdominal hysterectomy   . Lumbar fusion 01/2007    DR. Noel Gerold...3-LEVEL WITH FIXATION  . Parathyroidectomy   . Thyroidectomy   . Cardiac catheterization     PCI OF BOTH THE CIRCUMFLEX AND LEFT ANTERIOR DESCENDING ARTERY  . Cesarean section   . Heart stent 04-2010    X2    reports that she quit smoking about 25 years ago. Her smoking use included  Cigarettes. She quit after 30 years of use. She has never used smokeless tobacco. She reports that she does not drink alcohol or use illicit drugs. family history includes Heart attack (age of onset:40) in her mother; Heart attack (age of onset:52) in her father; and Heart disease in her father and mother.  There is no history of Colon cancer. Allergies  Allergen Reactions  . Doxycycline     REACTION: gi upset  . Miconazole Nitrate     REACTION: hives  . Prilosec (Omeprazole) Other (See Comments)    Chest pain   Current Outpatient Prescriptions on File Prior to Visit  Medication Sig Dispense Refill  . aspirin EC 81 MG tablet Take4 times a week      . atenolol (TENORMIN) 25 MG tablet TAKE 1 TABLET BY MOUTH TWICE DAILY  180 tablet  3  . cyclobenzaprine (FLEXERIL) 5 MG tablet As needed  (muscle spams)      . HYDROcodone-acetaminophen (NORCO) 10-325 MG per tablet Take 1 tablet by mouth 2 (two) times daily as needed.  60 tablet  1  . metFORMIN (GLUCOPHAGE-XR) 500 MG 24 hr tablet TAKE 3 TABLETS BY MOUTH EVERY MORNING  270 tablet  3  . nabumetone (RELAFEN) 500 MG tablet Take 1 tablet (500 mg total) by mouth 2 (two) times daily. As needed for pain  60 tablet  5  .  nitroGLYCERIN (NITROSTAT) 0.4 MG SL tablet Place 1 tablet (0.4 mg total) under the tongue every 5 (five) minutes as needed.  25 tablet  2  . rosuvastatin (CRESTOR) 20 MG tablet Take 1 tablet (20 mg total) by mouth daily.  90 tablet  3  . SYNTHROID 150 MCG tablet TAKE 1 TABLET BY MOUTH EVERY DAY  90 tablet  3  . cephALEXin (KEFLEX) 500 MG capsule Take 1 capsule (500 mg total) by mouth 4 (four) times daily.  40 capsule  0  . cetirizine (ZYRTEC) 10 MG tablet Take 1 tablet (10 mg total) by mouth daily.  90 tablet  3   No current facility-administered medications on file prior to visit.   Review of Systems  Constitutional: Negative for diaphoresis and unexpected weight change.  HENT: Negative for tinnitus.   Eyes: Negative for photophobia  and visual disturbance.  Respiratory: Negative for choking and stridor.   Gastrointestinal: Negative for vomiting and blood in stool.  Genitourinary: Negative for hematuria and decreased urine volume.  Musculoskeletal: Negative for gait problem.  Skin: Negative for color change and wound.  Neurological: Negative for tremors and numbness.  Psychiatric/Behavioral: Negative for decreased concentration. The patient is not hyperactive.       Objective:   Physical Exam BP 92/62  Pulse 88  Temp 98.5 F (36.9 C) (Oral)  Ht 5\' 6"  (1.676 m)  Wt 238 lb 12 oz (108.296 kg)  BMI 38.54 kg/m2  SpO2 97% Physical Exam  VS noted, mild ill Constitutional: Pt appears well-developed and well-nourished.  HENT: Head: Normocephalic.  Right Ear: External ear normal.  Left Ear: External ear normal. Bilat tm's mild erythema.  Sinus tender.  Pharynx mild erythema Eyes: Conjunctivae and EOM are normal. Pupils are equal, round, and reactive to light.  Neck: Normal range of motion. Neck supple.  Cardiovascular: Normal rate and regular rhythm.   Pulmonary/Chest: Effort normal and breath sounds normal.  Neurological: Pt is alert. Not confused  Skin: Skin is warm. No erythema.  Psychiatric: Pt behavior is normal. Thought content normal.     Assessment & Plan:

## 2011-12-01 NOTE — Assessment & Plan Note (Signed)
stable overall by hx and exam, most recent data reviewed with pt, and pt to continue medical treatment as before Lab Results  Component Value Date   HGBA1C 7.1* 08/22/2011   

## 2011-12-01 NOTE — Assessment & Plan Note (Signed)
With mild flare, ok for depomedrol IM per pt request as this helped last visit

## 2011-12-01 NOTE — Patient Instructions (Addendum)
You had the steroid shot today Take all new medications as prescribed Continue all other medications as before Please have the pharmacy call with any other refills you may need. Thank you for enrolling in MyChart. Please follow the instructions below to securely access your online medical record. MyChart allows you to send messages to your doctor, view your test results, renew your prescriptions, schedule appointments, and more. Remember your username is [email or web address]

## 2011-12-01 NOTE — Assessment & Plan Note (Signed)
Mild to mod, for antibx course,  to f/u any worsening symptoms or concerns 

## 2011-12-30 LAB — HM MAMMOGRAPHY: HM Mammogram: NEGATIVE

## 2012-01-03 ENCOUNTER — Telehealth: Payer: Self-pay | Admitting: Cardiology

## 2012-01-03 NOTE — Telephone Encounter (Signed)
Pt calling re ,

## 2012-01-04 ENCOUNTER — Telehealth: Payer: Self-pay | Admitting: *Deleted

## 2012-01-04 NOTE — Telephone Encounter (Signed)
Pt needs samples of crestor 20 mg  °

## 2012-01-04 NOTE — Telephone Encounter (Signed)
Spoke to pt.  Samples left at desk.

## 2012-01-04 NOTE — Telephone Encounter (Signed)
Ms. Mendibles came to the office to pick-up sample of medication that was left my [facility]. Before I knew what Ms. Monaco was here for, I asked Ms. Roycroft for paperwork so I can schedule her appointments. Immediately  the patient started screaming at me saying, what paper work! I'm here to pick up something. I proceeded to ask her was it a letter on medication samples, she stated I told you medicine. I noticed there was to bags with the last name Borrero, so I asked Ms. Bomba if her date of birth was [birth year] , she began shouting at me again saying, why are you telling all of my business! telling all these people my date of birth is 10-21-1945. I said to Ms. Maultsby , I never say a patients birth year when I give them there samples. I only say the month and date and the patient normally tells me the year. As she was signing the sample book she continued to scream at me. After she sign the book she through the ink pen at me. After she left I went and told Dr. Riley Kill how his patient was behaving. Ms. Savard always acts in that manner when she come to the office, but this is the first time she has ever through anything at an employee. Dr. Riley Kill said he will speak with her at the next office visit on: 01/24/12.

## 2012-01-12 ENCOUNTER — Ambulatory Visit (INDEPENDENT_AMBULATORY_CARE_PROVIDER_SITE_OTHER): Payer: Medicare Other | Admitting: Internal Medicine

## 2012-01-12 ENCOUNTER — Encounter: Payer: Self-pay | Admitting: Internal Medicine

## 2012-01-12 VITALS — BP 118/72 | HR 78 | Temp 98.4°F | Ht 66.0 in | Wt 233.0 lb

## 2012-01-12 DIAGNOSIS — G8929 Other chronic pain: Secondary | ICD-10-CM | POA: Diagnosis not present

## 2012-01-12 DIAGNOSIS — N390 Urinary tract infection, site not specified: Secondary | ICD-10-CM | POA: Diagnosis not present

## 2012-01-12 DIAGNOSIS — M549 Dorsalgia, unspecified: Secondary | ICD-10-CM

## 2012-01-12 DIAGNOSIS — E119 Type 2 diabetes mellitus without complications: Secondary | ICD-10-CM

## 2012-01-12 DIAGNOSIS — R109 Unspecified abdominal pain: Secondary | ICD-10-CM

## 2012-01-12 DIAGNOSIS — E039 Hypothyroidism, unspecified: Secondary | ICD-10-CM

## 2012-01-12 LAB — POCT URINALYSIS DIPSTICK
Ketones, UA: NEGATIVE
Protein, UA: NEGATIVE
Spec Grav, UA: <=1.005
Urobilinogen, UA: 0.2
pH, UA: 5

## 2012-01-12 MED ORDER — KETOROLAC TROMETHAMINE 30 MG/ML IJ SOLN
30.0000 mg | Freq: Once | INTRAMUSCULAR | Status: AC
  Administered 2012-01-12: 30 mg via INTRAMUSCULAR

## 2012-01-12 MED ORDER — CIPROFLOXACIN HCL 500 MG PO TABS: 500.0000 mg | ORAL_TABLET | Freq: Two times a day (BID) | ORAL | Status: DC

## 2012-01-12 NOTE — Assessment & Plan Note (Signed)
stable overall by hx and exam, most recent data reviewed with pt, and pt to continue medical treatment as before Lab Results  Component Value Date   TSH 0.74 08/22/2011

## 2012-01-12 NOTE — Progress Notes (Signed)
HPI: complains of UTI symptoms Onset 8 days ago, progressively worse associated with dysuria, L flank pain, suprapubic cramping and small volume voiding with increased frequency denies hematuria or fever The patient has a history of prior UTI  PMH: reviewed  ROS:  Gen.: No unexpected weight change, no night sweats Lungs: No cough or shortness of breath Cardiovascular: No palpitations or chest pain  PE: BP 118/72  Pulse 78  Temp 98.4 F (36.9 C) (Oral)  Ht 5\' 6"  (1.676 m)  Wt 233 lb (105.688 kg)  BMI 37.61 kg/m2  SpO2 95% General: No acute distress, nontoxic Lungs: Clear to auscultation Cardiovascular: Regular rate rhythm, no edema Abdomen: Mild to moderate discomfort of her suprapubic region, mild L flank tenderness to palpation  Lab Results  Component Value Date   WBC 8.5 08/22/2011   HGB 14.4 08/22/2011   HCT 44.8 08/22/2011   PLT 276.0 08/22/2011   GLUCOSE 118* 08/22/2011   CHOL 106 08/22/2011   TRIG 153.0* 08/22/2011   HDL 38.60* 08/22/2011   LDLDIRECT 110.1 09/21/2010   LDLCALC 37 08/22/2011   ALT 26 08/22/2011   AST 27 08/22/2011   NA 139 08/22/2011   K 4.3 08/22/2011   CL 105 08/22/2011   CREATININE 0.8 08/22/2011   BUN 11 08/22/2011   CO2 26 08/22/2011   TSH 0.74 08/22/2011   INR 1.19 07/08/2010   HGBA1C 7.1* 08/22/2011   MICROALBUR 1.3 08/22/2011    Assessment/Plan: UTI, classic symptoms with history of same Flank pain -  Chronic back pain, exac by UTI   Empiric antibiotic x7 days - cipro Toradol 30mg  IM today Hydration recommended education provided

## 2012-01-12 NOTE — Assessment & Plan Note (Signed)
stable overall by hx and exam, most recent data reviewed with pt, and pt to continue medical treatment as before Lab Results  Component Value Date   HGBA1C 7.1* 08/22/2011

## 2012-01-12 NOTE — Patient Instructions (Signed)
It was good to see you today. Toradol shot today for pain symptoms - Cipro antibiotics - Your prescription(s) have been submitted to your pharmacy. Please take as directed and contact our office if you believe you are having problem(s) with the medication(s). Alternate between ibuprofen and tylenol for aches, pain and fever symptoms as discussed Hydrate, rest and call if worse or unimproved

## 2012-01-24 ENCOUNTER — Ambulatory Visit: Payer: Medicare Other | Admitting: Cardiology

## 2012-01-25 DIAGNOSIS — M545 Low back pain, unspecified: Secondary | ICD-10-CM | POA: Diagnosis not present

## 2012-01-25 DIAGNOSIS — M5137 Other intervertebral disc degeneration, lumbosacral region: Secondary | ICD-10-CM | POA: Diagnosis not present

## 2012-01-27 ENCOUNTER — Ambulatory Visit (INDEPENDENT_AMBULATORY_CARE_PROVIDER_SITE_OTHER): Payer: Medicare Other | Admitting: Internal Medicine

## 2012-01-27 ENCOUNTER — Encounter: Payer: Self-pay | Admitting: Internal Medicine

## 2012-01-27 VITALS — BP 118/72 | HR 98 | Temp 97.1°F

## 2012-01-27 DIAGNOSIS — N390 Urinary tract infection, site not specified: Secondary | ICD-10-CM

## 2012-01-27 LAB — POCT URINALYSIS DIPSTICK
Clarity, UA: NEGATIVE
Glucose, UA: NEGATIVE
Nitrite, UA: NEGATIVE
Urobilinogen, UA: 0.2

## 2012-01-27 MED ORDER — KETOROLAC TROMETHAMINE 30 MG/ML IM SOLN
30.0000 mg | Freq: Once | INTRAMUSCULAR | Status: AC
  Administered 2012-01-27: 30 mg via INTRAMUSCULAR

## 2012-01-27 MED ORDER — CEFTRIAXONE SODIUM 500 MG IJ SOLR
500.0000 mg | Freq: Once | INTRAMUSCULAR | Status: AC
  Administered 2012-01-27: 500 mg via INTRAMUSCULAR

## 2012-01-27 NOTE — Patient Instructions (Signed)
Urinary Tract Infection Urinary tract infections (UTIs) can develop anywhere along your urinary tract. Your urinary tract is your body's drainage system for removing wastes and extra water. Your urinary tract includes two kidneys, two ureters, a bladder, and a urethra. Your kidneys are a pair of bean-shaped organs. Each kidney is about the size of your fist. They are located below your ribs, one on each side of your spine. CAUSES Infections are caused by microbes, which are microscopic organisms, including fungi, viruses, and bacteria. These organisms are so small that they can only be seen through a microscope. Bacteria are the microbes that most commonly cause UTIs. SYMPTOMS  Symptoms of UTIs may vary by age and gender of the patient and by the location of the infection. Symptoms in young women typically include a frequent and intense urge to urinate and a painful, burning feeling in the bladder or urethra during urination. Older women and men are more likely to be tired, shaky, and weak and have muscle aches and abdominal pain. A fever may mean the infection is in your kidneys. Other symptoms of a kidney infection include pain in your back or sides below the ribs, nausea, and vomiting. DIAGNOSIS To diagnose a UTI, your caregiver will ask you about your symptoms. Your caregiver also will ask to provide a urine sample. The urine sample will be tested for bacteria and white blood cells. White blood cells are made by your body to help fight infection. TREATMENT  Typically, UTIs can be treated with medication. Because most UTIs are caused by a bacterial infection, they usually can be treated with the use of antibiotics. The choice of antibiotic and length of treatment depend on your symptoms and the type of bacteria causing your infection. HOME CARE INSTRUCTIONS  If you were prescribed antibiotics, take them exactly as your caregiver instructs you. Finish the medication even if you feel better after you  have only taken some of the medication.  Drink enough water and fluids to keep your urine clear or pale yellow.  Avoid caffeine, tea, and carbonated beverages. They tend to irritate your bladder.  Empty your bladder often. Avoid holding urine for long periods of time.  Empty your bladder before and after sexual intercourse.  After a bowel movement, women should cleanse from front to back. Use each tissue only once. SEEK MEDICAL CARE IF:   You have back pain.  You develop a fever.  Your symptoms do not begin to resolve within 3 days. SEEK IMMEDIATE MEDICAL CARE IF:   You have severe back pain or lower abdominal pain.  You develop chills.  You have nausea or vomiting.  You have continued burning or discomfort with urination. MAKE SURE YOU:   Understand these instructions.  Will watch your condition.  Will get help right away if you are not doing well or get worse. Document Released: 10/13/2004 Document Revised: 07/05/2011 Document Reviewed: 02/11/2011 ExitCare Patient Information 2013 [employer].  

## 2012-01-27 NOTE — Progress Notes (Signed)
HPI  Pt presents to the clinic today with c/o urinary symptoms. She was seen 10 days ago for the same and given 7 days of antibiotics. She said the symptoms are still. They have improved but have not gone away. She does have recurrent UTI's. She has never been evaluated by a urologist before.   Review of Systems  Past Medical History  Diagnosis Date  . Coronary artery disease     PCI of the left circumflex and LAD  . Hyperlipidemia   . Hypertension   . Obesity, unspecified     MODERATE  . Thyroid disease     HYPOTHYROIDISM  . Diabetes mellitus     TYPE II  . Anxiety state, unspecified   . Depressive disorder, not elsewhere classified   . Unspecified menopausal and postmenopausal disorder   . Simple or unspecified chronic serous otitis media   . Degeneration of lumbar or lumbosacral intervertebral disc   . Allergic rhinitis, cause unspecified   . Hx of hysterectomy   . Lower GI bleed 07/23/2010  . Chronic LBP     Family History  Problem Relation Age of Onset  . Heart attack Mother 26    s/p D&C-CARDIAC ARREST 1966  . Heart disease Mother   . Heart attack Father 48    1978 WITH MI  . Heart disease Father   . Colon cancer Neg Hx     History   Social History  . Marital Status: Divorced    Spouse Name: N/A    Number of Children: 3  . Years of Education: N/A   Occupational History  . 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     Social History Main Topics  . Smoking status: Former Smoker -- 30 years    Types: Cigarettes    Quit date: 05/31/1986  . Smokeless tobacco: Never Used  . Alcohol Use: No  . Drug Use: No  . Sexually Active: Not on file   Other Topics Concern  . Not on file   Social History Narrative   DIVORCED3 CHILDRENPATIENT SIGNED A DESIGNATED PARTY RELEASE TO ALLOW HER DAUGHTER, TRAMAINE Hrdlicka, TO HAVE ACCESS TO HER MEDICAL RECORDS/INFORMATION. Daphane Shepherd, May 04, 2009 @ 3:27 PMSmokes cigarettes on rare occasions.    Allergies    Allergen Reactions  . Doxycycline     REACTION: gi upset  . Miconazole Nitrate     REACTION: hives  . Prilosec (Omeprazole) Other (See Comments)    Chest pain    Constitutional: Denies fever, malaise, fatigue, headache or abrupt weight changes.   GU: Pt reports urgency, frequency and pain with urination. Denies burning sensation, blood in urine, odor or discharge. Skin: Denies redness, rashes, lesions or ulcercations.   No other specific complaints in a complete review of systems (except as listed in HPI above).    Objective:   Physical Exam  BP 118/72  Pulse 98  Temp 97.1 F (36.2 C) (Oral)  SpO2 97% Wt Readings from Last 3 Encounters:  01/12/12 233 lb (105.688 kg)  12/01/11 238 lb 12 oz (108.296 kg)  10/20/11 238 lb 8 oz (108.183 kg)    General: Appears her stated age, well developed, well nourished in NAD. Cardiovascular: Normal rate and rhythm. S1,S2 noted.  No murmur, rubs or gallops noted. No JVD or BLE edema. No carotid bruits noted. Pulmonary/Chest: Normal effort and positive vesicular breath sounds. No respiratory distress. No wheezes, rales or ronchi noted.  Abdomen: Soft and  nontender. Normal bowel sounds, no bruits noted. No distention or masses noted. Liver, spleen and kidneys non palpable. Tender to palpation over the bladder area. No CVA tenderness.      Assessment & Plan:   Urgency Frequency Dysuria  Rocephin 500 mg im today Toradol 30 mg im today eRx sent if for Cipro 500 mg BID x 14 days eRx sent in for Pyridium 200 mg TID prn Drink plenty of fluids  RTC as needed or if symptoms persist.

## 2012-01-27 NOTE — Addendum Note (Signed)
Addended by: Carin Primrose on: 01/27/2012 03:27 PM   Modules accepted: Orders

## 2012-01-28 ENCOUNTER — Other Ambulatory Visit: Payer: Self-pay | Admitting: Internal Medicine

## 2012-01-30 ENCOUNTER — Telehealth: Payer: Self-pay | Admitting: Internal Medicine

## 2012-01-30 NOTE — Telephone Encounter (Signed)
[facility] Triage Call Report Triage Record Num: [id number] Operator: Jeraldine Loots Patient Name: [name] Call Date & Time: 01/28/2012 10:11:17AM Patient Phone: [phone] PCP: Oliver Barre Patient Gender: Female PCP Fax : [phone] Patient DOB: 03-11-1945 Practice Name: [facility] Reason for Call: Caller: Ahniya/Patient; PCP: Oliver Barre (Adults only); CB#: [phone]. She was seen yesterday for UTI and ear pain. Medications were to be sent to [facility]. They do not have the scripts. Per EPIC same were Cipro 500mg  1 po bic x 14 days, QS, NR and Pyridium 200mg  1 po tid prn. Called in #9, NR. Home care given to patient. Protocol(s) Used: Office Note Recommended Outcome per Protocol: Information Noted and Sent to Office Reason for Outcome: Caller information to office Care Advice:

## 2012-02-23 ENCOUNTER — Ambulatory Visit (INDEPENDENT_AMBULATORY_CARE_PROVIDER_SITE_OTHER): Payer: Medicare Other | Admitting: Cardiology

## 2012-02-23 VITALS — BP 108/70 | HR 72 | Ht 66.0 in | Wt 235.0 lb

## 2012-02-23 DIAGNOSIS — I2510 Atherosclerotic heart disease of native coronary artery without angina pectoris: Secondary | ICD-10-CM

## 2012-02-23 NOTE — Patient Instructions (Signed)
This pt was worked up for MD to evaluate. Dr Riley Kill left the office prior to seeing the pt due to the cardiac catheterization lab needing assistance with a procedure.

## 2012-02-24 ENCOUNTER — Ambulatory Visit (INDEPENDENT_AMBULATORY_CARE_PROVIDER_SITE_OTHER): Payer: Medicare Other | Admitting: Cardiology

## 2012-02-24 ENCOUNTER — Encounter: Payer: Self-pay | Admitting: Cardiology

## 2012-02-24 VITALS — BP 102/64 | HR 72 | Ht 66.0 in | Wt 235.0 lb

## 2012-02-24 DIAGNOSIS — I2510 Atherosclerotic heart disease of native coronary artery without angina pectoris: Secondary | ICD-10-CM | POA: Diagnosis not present

## 2012-02-24 DIAGNOSIS — I714 Abdominal aortic aneurysm, without rupture, unspecified: Secondary | ICD-10-CM

## 2012-02-24 DIAGNOSIS — E785 Hyperlipidemia, unspecified: Secondary | ICD-10-CM | POA: Diagnosis not present

## 2012-02-24 DIAGNOSIS — I10 Essential (primary) hypertension: Secondary | ICD-10-CM

## 2012-02-24 DIAGNOSIS — F17200 Nicotine dependence, unspecified, uncomplicated: Secondary | ICD-10-CM

## 2012-02-24 NOTE — Patient Instructions (Addendum)
Your physician has requested that you have an echocardiogram. Echocardiography is a painless test that uses sound waves to create images of your heart. It provides your doctor with information about the size and shape of your heart and how well your heart's chambers and valves are working. This procedure takes approximately one hour. There are no restrictions for this procedure.  Your physician wants you to follow-up in: 6 MONTHS with Dr Excell Seltzer (previous pt of Dr Riley Kill). You will receive a reminder letter in the mail two months in advance. If you don't receive a letter, please call our office to schedule the follow-up appointment.  Your physician recommends that you continue on your current medications as directed. Please refer to the Current Medication list given to you today.

## 2012-02-27 ENCOUNTER — Other Ambulatory Visit (INDEPENDENT_AMBULATORY_CARE_PROVIDER_SITE_OTHER): Payer: Medicare Other

## 2012-02-27 ENCOUNTER — Encounter: Payer: Self-pay | Admitting: Internal Medicine

## 2012-02-27 ENCOUNTER — Other Ambulatory Visit: Payer: Self-pay | Admitting: Internal Medicine

## 2012-02-27 ENCOUNTER — Ambulatory Visit (INDEPENDENT_AMBULATORY_CARE_PROVIDER_SITE_OTHER): Payer: Medicare Other | Admitting: Internal Medicine

## 2012-02-27 VITALS — BP 102/70 | HR 92 | Temp 99.1°F | Ht 66.0 in | Wt 238.5 lb

## 2012-02-27 DIAGNOSIS — M545 Low back pain, unspecified: Secondary | ICD-10-CM

## 2012-02-27 DIAGNOSIS — I10 Essential (primary) hypertension: Secondary | ICD-10-CM

## 2012-02-27 DIAGNOSIS — E119 Type 2 diabetes mellitus without complications: Secondary | ICD-10-CM

## 2012-02-27 DIAGNOSIS — J069 Acute upper respiratory infection, unspecified: Secondary | ICD-10-CM

## 2012-02-27 DIAGNOSIS — R52 Pain, unspecified: Secondary | ICD-10-CM | POA: Diagnosis not present

## 2012-02-27 DIAGNOSIS — G8929 Other chronic pain: Secondary | ICD-10-CM | POA: Diagnosis not present

## 2012-02-27 LAB — LIPID PANEL
Cholesterol: 154 mg/dL (ref 0–200)
HDL: 48.2 mg/dL (ref >39.00)
Triglycerides: 288 mg/dL — ABNORMAL HIGH (ref 0.0–149.0)
VLDL: 57.6 mg/dL — ABNORMAL HIGH (ref 0.0–40.0)

## 2012-02-27 LAB — HEPATIC FUNCTION PANEL
Bilirubin, Direct: 0.1 mg/dL (ref 0.0–0.3)
Total Bilirubin: 0.6 mg/dL (ref 0.3–1.2)

## 2012-02-27 LAB — BASIC METABOLIC PANEL
Calcium: 8.9 mg/dL (ref 8.4–10.5)
Creatinine, Ser: 0.8 mg/dL (ref 0.4–1.2)

## 2012-02-27 LAB — LDL CHOLESTEROL, DIRECT: Direct LDL: 73.1 mg/dL

## 2012-02-27 LAB — HEMOGLOBIN A1C: Hgb A1c MFr Bld: 7.4 % — ABNORMAL HIGH (ref 4.6–6.5)

## 2012-02-27 MED ORDER — CEFUROXIME AXETIL 250 MG PO TABS: 250.0000 mg | ORAL_TABLET | Freq: Two times a day (BID) | ORAL | Status: DC

## 2012-02-27 MED ORDER — KETOROLAC TROMETHAMINE 30 MG/ML IJ SOLN
30.0000 mg | Freq: Once | INTRAMUSCULAR | Status: AC
  Administered 2012-02-27: 30 mg via INTRAMUSCULAR

## 2012-02-27 MED ORDER — CYCLOBENZAPRINE HCL 5 MG PO TABS: 5.0000 mg | ORAL_TABLET | Freq: Three times a day (TID) | ORAL | Status: DC | PRN

## 2012-02-27 MED ORDER — METFORMIN HCL ER 500 MG PO TB24: ORAL_TABLET | ORAL | Status: DC

## 2012-02-27 MED ORDER — TRAMADOL HCL 50 MG PO TABS: 50.0000 mg | ORAL_TABLET | Freq: Four times a day (QID) | ORAL | Status: DC | PRN

## 2012-02-27 NOTE — Progress Notes (Signed)
I briefly saw Ms. Raczynski and spoke with her but there was an acute myocardial infarction in the cath lab and they needed an interventionalist. She kindly agreed to return in the am to be seen.  TS

## 2012-02-27 NOTE — Assessment & Plan Note (Signed)
With left MSK flare, for toradol IM today, cont flexeril and tramadol prn,  to f/u any worsening symptoms or concerns

## 2012-02-27 NOTE — Progress Notes (Signed)
HPI:  The graciously agreed to see patient returns today in followup. She's not been having significant chest pain. The patient very nicely return today in followup after having graciously allowing me to walk out of her visit yesterday as I got called for an MI.  She seems to be doing well.    Current Outpatient Prescriptions  Medication Sig Dispense Refill  . aspirin EC 81 MG tablet Take4 times a week      . atenolol (TENORMIN) 25 MG tablet TAKE 1 TABLET BY MOUTH TWICE DAILY  180 tablet  3  . cetirizine (ZYRTEC) 10 MG tablet Take 1 tablet (10 mg total) by mouth daily.  90 tablet  3  . nitroGLYCERIN (NITROSTAT) 0.4 MG SL tablet Place 1 tablet (0.4 mg total) under the tongue every 5 (five) minutes as needed.  25 tablet  2  . rosuvastatin (CRESTOR) 20 MG tablet Take 1 tablet (20 mg total) by mouth daily.  90 tablet  3  . SYNTHROID 150 MCG tablet TAKE 1 TABLET BY MOUTH EVERY DAY  90 tablet  3  . cefUROXime (CEFTIN) 250 MG tablet Take 1 tablet (250 mg total) by mouth 2 (two) times daily.  20 tablet  0  . cyclobenzaprine (FLEXERIL) 5 MG tablet Take 1 tablet (5 mg total) by mouth 3 (three) times daily as needed for muscle spasms. As needed  (muscle spams)  90 tablet  1  . metFORMIN (GLUCOPHAGE-XR) 500 MG 24 hr tablet Take 4 tabs by mouth in the AM  360 tablet  3  . traMADol (ULTRAM) 50 MG tablet Take 1 tablet (50 mg total) by mouth every 6 (six) hours as needed for pain.  60 tablet  1   No current facility-administered medications for this visit.    Allergies  Allergen Reactions  . Doxycycline     REACTION: gi upset  . Miconazole Nitrate     REACTION: hives  . Prilosec (Omeprazole) Other (See Comments)    Chest pain    Past Medical History  Diagnosis Date  . Coronary artery disease     PCI of the left circumflex and LAD  . Hyperlipidemia   . Hypertension   . Obesity, unspecified     MODERATE  . Thyroid disease     HYPOTHYROIDISM  . Diabetes mellitus     TYPE II  . Anxiety state,  unspecified   . Depressive disorder, not elsewhere classified   . Unspecified menopausal and postmenopausal disorder   . Simple or unspecified chronic serous otitis media   . Degeneration of lumbar or lumbosacral intervertebral disc   . Allergic rhinitis, cause unspecified   . Hx of hysterectomy   . Lower GI bleed 07/23/2010  . Chronic LBP     Past Surgical History  Procedure Laterality Date  . Total abdominal hysterectomy    . Lumbar fusion  01/2007    DR. Noel Gerold...3-LEVEL WITH FIXATION  . Parathyroidectomy    . Thyroidectomy    . Cardiac catheterization      PCI OF BOTH THE CIRCUMFLEX AND LEFT ANTERIOR DESCENDING ARTERY  . Cesarean section    . Heart stent  04-2010    X2    Family History  Problem Relation Age of Onset  . Heart attack Mother 39    s/p D&C-CARDIAC ARREST 1966  . Heart disease Mother   . Heart attack Father 11    1978 WITH MI  . Heart disease Father   . Colon cancer Neg Hx  History   Social History  . Marital Status: Divorced    Spouse Name: N/A    Number of Children: 3  . Years of Education: N/A   Occupational History  . 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     Social History Main Topics  . Smoking status: Former Smoker -- 30 years    Types: Cigarettes    Quit date: 05/31/1986  . Smokeless tobacco: Never Used  . Alcohol Use: No  . Drug Use: No  . Sexually Active: Not on file   Other Topics Concern  . Not on file   Social History Narrative   DIVORCED   3 CHILDREN   PATIENT SIGNED A DESIGNATED PARTY RELEASE TO ALLOW HER DAUGHTER, TRAMAINE Raczka, TO HAVE ACCESS TO HER MEDICAL RECORDS/INFORMATION. Daphane Shepherd, May 04, 2009 @ 3:27 PM   Smokes cigarettes on rare occasions.    ROS: Please see the HPI.  All other systems reviewed and negative.  PHYSICAL EXAM:  BP 102/64  Pulse 72  Ht 5\' 6"  (1.676 m)  Wt 235 lb (106.595 kg)  BMI 37.95 kg/m2  General: Well developed, well nourished, in no acute distress. Head:   Normocephalic and atraumatic. Neck: no JVD Lungs: Clear to auscultation and percussion. Heart: Normal S1 and S2.  No murmur, rubs or gallops.  Pulses: Pulses normal in all 4 extremities. Extremities: No clubbing or cyanosis. No edema. Neurologic: Alert and oriented x 3.  EKG:  Done yesterday.  NSR. Leftward axis.  Delay in  R wave progression.    ASSESSMENT AND PLAN:

## 2012-02-27 NOTE — Patient Instructions (Addendum)
You had the pain shot today (toradol) Please take all new medication as prescribed - the antibiotic (generic ceftin), and pain medication (tramadol)  Please continue all other medications as before, and refills have been done if requested, including the flexeril as needed Please go to the LAB in the Basement (turn left off the elevator) for the tests to be done today You will be contacted by phone if any changes need to be made immediately.  Otherwise, you will receive a letter about your results with an explanation, but please check with MyChart first. Thank you for enrolling in MyChart. Please follow the instructions below to securely access your online medical record. MyChart allows you to send messages to your doctor, view your test results, renew your prescriptions, schedule appointments, and more. To Log into My Chart online, please go by [employer] or [employer] to [email or web address], or download the MyChart App from the [employer] of [employer] .  Your Username is: [email or web address] (pass 1947) Please return in 6 months, or sooner if needed

## 2012-02-27 NOTE — Assessment & Plan Note (Signed)
stable overall by history and exam, recent data reviewed with pt, and pt to continue medical treatment as before,  to f/u any worsening symptoms or concerns BP Readings from Last 3 Encounters:  02/27/12 102/70  02/24/12 102/64  02/23/12 108/70

## 2012-02-27 NOTE — Assessment & Plan Note (Signed)
Mild to mod, for antibx course,  to f/u any worsening symptoms or concerns 

## 2012-02-27 NOTE — Assessment & Plan Note (Signed)
stable overall by history and exam, recent data reviewed with pt, and pt to continue medical treatment as before,  to f/u any worsening symptoms or concerns Lab Results  Component Value Date   HGBA1C 7.1* 08/22/2011   For lab f/u today

## 2012-02-27 NOTE — Progress Notes (Signed)
Subjective:     Patient ID: [name], female   DOB: 1945/03/19, 67 y.o.   MRN: [id number]  Diabetes  Otalgia     Here with 2-3 days acute onset fever, facial pain,right > left ear ache, pressure, headache, general weakness and malaise, and greenish d/c, with mild ST and cough, but pt denies chest pain, wheezing, increased sob or doe, orthopnea, PND, increased LE swelling, palpitations, dizziness or syncope.  Pt denies new neurological symptoms such as new headache, or facial or extremity weakness or numbness   Pt denies polydipsia, polyuria, or low sugar symptoms such as weakness or confusion improved with po intake.  Pt states overall good compliance with meds, trying to follow lower cholesterol, diabetic diet, wt overall stable but little exercise however.   Also - Pt continues to have 1 wk recurring left LBP without change in severity, bowel or bladder change, fever, wt loss,  worsening LE pain/numbness/weakness, gait change or falls, but worse to stand or bend Past Medical History  Diagnosis Date  . Coronary artery disease     PCI of the left circumflex and LAD  . Hyperlipidemia   . Hypertension   . Obesity, unspecified     MODERATE  . Thyroid disease     HYPOTHYROIDISM  . Diabetes mellitus     TYPE II  . Anxiety state, unspecified   . Depressive disorder, not elsewhere classified   . Unspecified menopausal and postmenopausal disorder   . Simple or unspecified chronic serous otitis media   . Degeneration of lumbar or lumbosacral intervertebral disc   . Allergic rhinitis, cause unspecified   . Hx of hysterectomy   . Lower GI bleed 07/23/2010  . Chronic LBP    Past Surgical History  Procedure Laterality Date  . Total abdominal hysterectomy    . Lumbar fusion  01/2007    DR. Noel Gerold...3-LEVEL WITH FIXATION  . Parathyroidectomy    . Thyroidectomy    . Cardiac catheterization      PCI OF BOTH THE CIRCUMFLEX AND LEFT ANTERIOR DESCENDING ARTERY  . Cesarean section    . Heart stent   04-2010    X2    reports that she quit smoking about 25 years ago. Her smoking use included Cigarettes. She smoked 0.00 packs per day for 30 years. She has never used smokeless tobacco. She reports that she does not drink alcohol or use illicit drugs. family history includes Heart attack (age of onset: 70) in her mother; Heart attack (age of onset: 61) in her father; and Heart disease in her father and mother.  There is no history of Colon cancer. Allergies  Allergen Reactions  . Doxycycline     REACTION: gi upset  . Miconazole Nitrate     REACTION: hives  . Prilosec (Omeprazole) Other (See Comments)    Chest pain   Current Outpatient Prescriptions on File Prior to Visit  Medication Sig Dispense Refill  . aspirin EC 81 MG tablet Take4 times a week      . atenolol (TENORMIN) 25 MG tablet TAKE 1 TABLET BY MOUTH TWICE DAILY  180 tablet  3  . cetirizine (ZYRTEC) 10 MG tablet Take 1 tablet (10 mg total) by mouth daily.  90 tablet  3  . cyclobenzaprine (FLEXERIL) 5 MG tablet As needed  (muscle spams)      . metFORMIN (GLUCOPHAGE-XR) 500 MG 24 hr tablet TAKE 3 TABLETS BY MOUTH EVERY MORNING  270 tablet  3  . nitroGLYCERIN (NITROSTAT) 0.4 MG  SL tablet Place 1 tablet (0.4 mg total) under the tongue every 5 (five) minutes as needed.  25 tablet  2  . rosuvastatin (CRESTOR) 20 MG tablet Take 1 tablet (20 mg total) by mouth daily.  90 tablet  3  . SYNTHROID 150 MCG tablet TAKE 1 TABLET BY MOUTH EVERY DAY  90 tablet  3   No current facility-administered medications on file prior to visit.   Review of Systems  HENT: Positive for ear pain.     Constitutional: Negative for unexpected weight change, or unusual diaphoresis  HENT: Negative for tinnitus.   Eyes: Negative for photophobia and visual disturbance.  Respiratory: Negative for choking and stridor.   Gastrointestinal: Negative for vomiting and blood in stool.  Genitourinary: Negative for hematuria and decreased urine volume.  Musculoskeletal:  Negative for acute joint swelling Skin: Negative for color change and wound.  Neurological: Negative for tremors and numbness other than noted  Psychiatric/Behavioral: Negative for decreased concentration or  hyperactivity.       Objective:   Physical Exam BP 102/70  Pulse 92  Temp(Src) 99.1 F (37.3 C) (Oral)  Ht 5\' 6"  (1.676 m)  Wt 238 lb 8 oz (108.183 kg)  BMI 38.51 kg/m2  SpO2 98% VS noted, mild ill appearing Constitutional: Pt appears well-developed and well-nourished.  HENT: Head: NCAT.  Right Ear: External ear normal.  Left Ear: External ear normal. Bilat tm's with severe erythema right > left.  Max sinus areas mild tender.  Pharynx with mild erythema, no exudate  Eyes: Conjunctivae and EOM are normal. Pupils are equal, round, and reactive to light.  Neck: Normal range of motion. Neck supple.  Cardiovascular: Normal rate and regular rhythm.   Pulmonary/Chest: Effort normal and breath sounds normal.  Spine: nontender, but has mild to mod spasm/tender left lumbar paralumbar Neurological: Pt is alert. Not confused , motor/dtr/gait intact to LE's Skin: Skin is warm. No erythema. No rash Psychiatric: Pt behavior is normal. Thought content normal.      Assessment:       Plan:

## 2012-03-01 NOTE — Assessment & Plan Note (Signed)
Continues to smoke, but only when aggravated.

## 2012-03-01 NOTE — Assessment & Plan Note (Signed)
No current symptoms.  No recurrent chest pain.  Continue medical therapy, and encourage not to smoke.

## 2012-03-01 NOTE — Assessment & Plan Note (Addendum)
Will need continuing follow up in clinic with [place].  Has seen Dr. Arbie Cookey in a consultation. Will arrange.

## 2012-03-01 NOTE — Assessment & Plan Note (Signed)
Appropriate to recheck her labs.

## 2012-03-01 NOTE — Assessment & Plan Note (Signed)
Currently not significantly elevated.

## 2012-03-05 ENCOUNTER — Ambulatory Visit (INDEPENDENT_AMBULATORY_CARE_PROVIDER_SITE_OTHER): Payer: Medicare Other | Admitting: Internal Medicine

## 2012-03-05 ENCOUNTER — Ambulatory Visit (INDEPENDENT_AMBULATORY_CARE_PROVIDER_SITE_OTHER)
Admission: RE | Admit: 2012-03-05 | Discharge: 2012-03-05 | Disposition: A | Discharge status: Home / Self Care | Payer: Medicare Other | Source: Ambulatory Visit | Attending: Internal Medicine | Admitting: Internal Medicine

## 2012-03-05 ENCOUNTER — Encounter: Payer: Self-pay | Admitting: Internal Medicine

## 2012-03-05 VITALS — BP 104/70 | HR 88 | Temp 98.5°F | Ht 66.0 in | Wt 232.4 lb

## 2012-03-05 DIAGNOSIS — I10 Essential (primary) hypertension: Secondary | ICD-10-CM | POA: Diagnosis not present

## 2012-03-05 DIAGNOSIS — R05 Cough: Secondary | ICD-10-CM | POA: Diagnosis not present

## 2012-03-05 DIAGNOSIS — R059 Cough, unspecified: Secondary | ICD-10-CM | POA: Diagnosis not present

## 2012-03-05 DIAGNOSIS — E119 Type 2 diabetes mellitus without complications: Secondary | ICD-10-CM | POA: Diagnosis not present

## 2012-03-05 DIAGNOSIS — R0602 Shortness of breath: Secondary | ICD-10-CM | POA: Diagnosis not present

## 2012-03-05 DIAGNOSIS — R058 Other specified cough: Secondary | ICD-10-CM

## 2012-03-05 DIAGNOSIS — IMO0001 Reserved for inherently not codable concepts without codable children: Secondary | ICD-10-CM | POA: Diagnosis not present

## 2012-03-05 DIAGNOSIS — R079 Chest pain, unspecified: Secondary | ICD-10-CM | POA: Diagnosis not present

## 2012-03-05 MED ORDER — AMOXICILLIN-POT CLAVULANATE 875-125 MG PO TABS: 1.0000 | ORAL_TABLET | Freq: Two times a day (BID) | ORAL | Status: DC

## 2012-03-05 MED ORDER — KETOROLAC TROMETHAMINE 30 MG/ML IJ SOLN
30.0000 mg | Freq: Once | INTRAMUSCULAR | Status: AC
  Administered 2012-03-05: 30 mg via INTRAMUSCULAR

## 2012-03-05 MED ORDER — CEFTRIAXONE SODIUM 1 G IJ SOLR
1.0000 g | Freq: Once | INTRAMUSCULAR | Status: AC
  Administered 2012-03-05: 1 g via INTRAMUSCULAR

## 2012-03-05 MED ORDER — HYDROCODONE-HOMATROPINE 5-1.5 MG/5ML PO SYRP: 5.0000 mL | ORAL_SOLUTION | Freq: Four times a day (QID) | ORAL | Status: DC | PRN

## 2012-03-05 NOTE — Assessment & Plan Note (Signed)
With fever, and few rll crackles - for cxr, augmentin asd, cough med prn,  to f/u any worsening symptoms or concerns

## 2012-03-05 NOTE — Patient Instructions (Signed)
You had the antibiotic and pain shots today Please take all new medication as prescribed - the antibiotic, and cough medicine Please go to the XRAY Department in the Basement (go straight as you get off the elevator) for the x-ray testing You will be contacted by phone if any changes need to be made immediately.  Otherwise, you will receive a letter about your results with an explanation, but please check with MyChart first. Thank you for enrolling in MyChart. Please follow the instructions below to securely access your online medical record. MyChart allows you to send messages to your doctor, view your test results, renew your prescriptions, schedule appointments, and more.

## 2012-03-05 NOTE — Progress Notes (Addendum)
Subjective:    Patient ID: [name], female    DOB: 04-Jun-1945, 67 y.o.   MRN: [id number]  HPI  Here with acute onset mild to mod 2-3 days ST, HA, general weakness and malaise, myalgias, with non prod cough sputum, but Pt denies chest pain, increased sob or doe, wheezing, orthopnea, PND, increased LE swelling, palpitations, dizziness or syncope.   Pt denies polydipsia, polyuria, Pt states overall good compliance with meds.  Pt denies new neurological symptoms such as new headache, or facial or extremity weakness or numbness   Past Medical History  Diagnosis Date  . Coronary artery disease     PCI of the left circumflex and LAD  . Hyperlipidemia   . Hypertension   . Obesity, unspecified     MODERATE  . Thyroid disease     HYPOTHYROIDISM  . Diabetes mellitus     TYPE II  . Anxiety state, unspecified   . Depressive disorder, not elsewhere classified   . Unspecified menopausal and postmenopausal disorder   . Simple or unspecified chronic serous otitis media   . Degeneration of lumbar or lumbosacral intervertebral disc   . Allergic rhinitis, cause unspecified   . Hx of hysterectomy   . Lower GI bleed 07/23/2010  . Chronic LBP    Past Surgical History  Procedure Laterality Date  . Total abdominal hysterectomy    . Lumbar fusion  01/2007    DR. Noel Gerold...3-LEVEL WITH FIXATION  . Parathyroidectomy    . Thyroidectomy    . Cardiac catheterization      PCI OF BOTH THE CIRCUMFLEX AND LEFT ANTERIOR DESCENDING ARTERY  . Cesarean section    . Heart stent  04-2010    X2    reports that she quit smoking about 25 years ago. Her smoking use included Cigarettes. She smoked 0.00 packs per day for 30 years. She has never used smokeless tobacco. She reports that she does not drink alcohol or use illicit drugs. family history includes Heart attack (age of onset: 54) in her mother; Heart attack (age of onset: 60) in her father; and Heart disease in her father and mother.  There is no history of Colon  cancer. Allergies  Allergen Reactions  . Doxycycline     REACTION: gi upset  . Miconazole Nitrate     REACTION: hives  . Prilosec (Omeprazole) Other (See Comments)    Chest pain  . Augmentin (Amoxicillin-Pot Clavulanate) Hives, Itching and Rash   Current Outpatient Prescriptions on File Prior to Visit  Medication Sig Dispense Refill  . aspirin EC 81 MG tablet Take4 times a week      . atenolol (TENORMIN) 25 MG tablet TAKE 1 TABLET BY MOUTH TWICE DAILY  180 tablet  3  . cetirizine (ZYRTEC) 10 MG tablet Take 1 tablet (10 mg total) by mouth daily.  90 tablet  3  . cyclobenzaprine (FLEXERIL) 5 MG tablet Take 1 tablet (5 mg total) by mouth 3 (three) times daily as needed for muscle spasms. As needed  (muscle spams)  90 tablet  1  . metFORMIN (GLUCOPHAGE-XR) 500 MG 24 hr tablet Take 4 tabs by mouth in the AM  360 tablet  3  . nitroGLYCERIN (NITROSTAT) 0.4 MG SL tablet Place 1 tablet (0.4 mg total) under the tongue every 5 (five) minutes as needed.  25 tablet  2  . rosuvastatin (CRESTOR) 20 MG tablet Take 1 tablet (20 mg total) by mouth daily.  90 tablet  3  . SYNTHROID 150  MCG tablet TAKE 1 TABLET BY MOUTH EVERY DAY  90 tablet  3  . traMADol (ULTRAM) 50 MG tablet Take 1 tablet (50 mg total) by mouth every 6 (six) hours as needed for pain.  60 tablet  1   No current facility-administered medications on file prior to visit.   Review of Systems  Constitutional: Negative for unexpected weight change, or unusual diaphoresis  HENT: Negative for tinnitus.   Eyes: Negative for photophobia and visual disturbance.  Respiratory: Negative for choking and stridor.   Gastrointestinal: Negative for vomiting and blood in stool.  Genitourinary: Negative for hematuria and decreased urine volume.  Musculoskeletal: Negative for acute joint swelling Skin: Negative for color change and wound.  Neurological: Negative for tremors and numbness other than noted  Psychiatric/Behavioral: Negative for decreased  concentration or  hyperactivity.       Objective:   Physical Exam BP 104/70  Pulse 88  Temp(Src) 98.5 F (36.9 C) (Oral)  Ht 5\' 6"  (1.676 m)  Wt 232 lb 6 oz (105.405 kg)  BMI 37.52 kg/m2  SpO2 95% VS noted,  Constitutional: Pt appears well-developed and well-nourished.  HENT: Head: NCAT.  Right Ear: External ear normal.  Left Ear: External ear normal.  Bilat tm's with mild erythema.  Max sinus areas non tender.  Pharynx with mild erythema, no exudate Eyes: Conjunctivae and EOM are normal. Pupils are equal, round, and reactive to light.  Neck: Normal range of motion. Neck supple.  Cardiovascular: Normal rate and regular rhythm.   Pulmonary/Chest: Effort normal and breath sounds decreased with feww RLL rales Neurological: Pt is alert. Not confused  Skin: Skin is warm. No erythema.  Psychiatric: Pt behavior is normal. Thought content normal.     Assessment & Plan:  Quality Measures addressed:  Mammogram:  pt declines and will self-refer  Diabetes Aspirin use: pt declines further medication Diabetes Tobacco non-use: pt declines to quit smoking  ACE/ARB therapy for CAD, Diabetes, and/or LVSD: pt declines further medication

## 2012-03-06 ENCOUNTER — Ambulatory Visit (HOSPITAL_COMMUNITY): Payer: Medicare Other | Attending: Cardiovascular Disease | Admitting: Radiology

## 2012-03-06 DIAGNOSIS — E119 Type 2 diabetes mellitus without complications: Secondary | ICD-10-CM | POA: Insufficient documentation

## 2012-03-06 DIAGNOSIS — E785 Hyperlipidemia, unspecified: Secondary | ICD-10-CM | POA: Insufficient documentation

## 2012-03-06 DIAGNOSIS — I10 Essential (primary) hypertension: Secondary | ICD-10-CM | POA: Diagnosis not present

## 2012-03-06 DIAGNOSIS — F17200 Nicotine dependence, unspecified, uncomplicated: Secondary | ICD-10-CM | POA: Diagnosis not present

## 2012-03-06 DIAGNOSIS — I2510 Atherosclerotic heart disease of native coronary artery without angina pectoris: Secondary | ICD-10-CM | POA: Diagnosis not present

## 2012-03-06 DIAGNOSIS — E669 Obesity, unspecified: Secondary | ICD-10-CM | POA: Insufficient documentation

## 2012-03-06 NOTE — Progress Notes (Signed)
Echocardiogram performed.  

## 2012-03-08 ENCOUNTER — Encounter: Payer: Self-pay | Admitting: Internal Medicine

## 2012-03-08 ENCOUNTER — Ambulatory Visit (INDEPENDENT_AMBULATORY_CARE_PROVIDER_SITE_OTHER): Payer: Medicare Other | Admitting: Internal Medicine

## 2012-03-08 VITALS — BP 118/72 | HR 103 | Temp 97.7°F | Resp 14 | Wt 234.0 lb

## 2012-03-08 DIAGNOSIS — J189 Pneumonia, unspecified organism: Secondary | ICD-10-CM | POA: Diagnosis not present

## 2012-03-08 DIAGNOSIS — Z1289 Encounter for screening for malignant neoplasm of other sites: Secondary | ICD-10-CM | POA: Diagnosis not present

## 2012-03-08 DIAGNOSIS — T887XXA Unspecified adverse effect of drug or medicament, initial encounter: Secondary | ICD-10-CM | POA: Diagnosis not present

## 2012-03-08 DIAGNOSIS — L500 Allergic urticaria: Secondary | ICD-10-CM

## 2012-03-08 DIAGNOSIS — Z1212 Encounter for screening for malignant neoplasm of rectum: Secondary | ICD-10-CM | POA: Diagnosis not present

## 2012-03-08 DIAGNOSIS — Z1231 Encounter for screening mammogram for malignant neoplasm of breast: Secondary | ICD-10-CM | POA: Diagnosis not present

## 2012-03-08 DIAGNOSIS — T50905A Adverse effect of unspecified drugs, medicaments and biological substances, initial encounter: Secondary | ICD-10-CM

## 2012-03-08 MED ORDER — HYDROXYZINE HCL 10 MG PO TABS: 10.0000 mg | ORAL_TABLET | Freq: Three times a day (TID) | ORAL | Status: DC | PRN

## 2012-03-08 MED ORDER — LEVOFLOXACIN 500 MG PO TABS: 500.0000 mg | ORAL_TABLET | Freq: Every day | ORAL | Status: DC

## 2012-03-08 MED ORDER — METHYLPREDNISOLONE ACETATE 80 MG/ML IJ SUSP
80.0000 mg | Freq: Once | INTRAMUSCULAR | Status: AC
  Administered 2012-03-08: 80 mg via INTRAMUSCULAR

## 2012-03-08 NOTE — Patient Instructions (Addendum)
It was good to see you today. We have reviewed your prior records including labs and tests today Stop the Augmentin Please let people know that you're allergic to penicillin because penicillin causes a rash - we have updated your allergy list to reflect your penicillin allergy Cortisone shot given today to help her itch symptoms Also take Atarax 3 times daily as needed for itch - Take Levaquin once daily for pneumonia infection instead of Augmentin Your prescription(s) have been submitted to your pharmacy. Please take as directed and contact our office if you believe you are having problem(s) with the medication(s). Call if symptoms unimproved in the next 2-3 days, sooner if worse

## 2012-03-08 NOTE — Progress Notes (Signed)
Subjective:    Patient ID: [name], female    DOB: 06-Aug-1945, 67 y.o.   MRN: [id number]  HPI Complains of itching and rash Onset of symptoms in last 24 hours Rapidly progressive intensity Rash located primarily on the extremities at axilla and groin, newly developing on the face and neck in past 4 hour Denies trouble breathing, trouble swallowing or lip/tongue swelling No history of same Taking Augmentin for the past 48 hours because of diagnosed right lower lobe pneumonia on chest x-ray Also given IM Rocephin and Toradol at office visit when pneumonia diagnosed  Past Medical History  Diagnosis Date  . Coronary artery disease     PCI of the left circumflex and LAD  . Hyperlipidemia   . Hypertension   . Obesity, unspecified     MODERATE  . Thyroid disease     HYPOTHYROIDISM  . Diabetes mellitus     TYPE II  . Anxiety state, unspecified   . Depressive disorder, not elsewhere classified   . Unspecified menopausal and postmenopausal disorder   . Simple or unspecified chronic serous otitis media   . Degeneration of lumbar or lumbosacral intervertebral disc   . Allergic rhinitis, cause unspecified   . Hx of hysterectomy   . Lower GI bleed 07/23/2010  . Chronic LBP      Review of Systems  Constitutional: Negative for fever, chills, activity change and fatigue.  Respiratory: Positive for cough. Negative for shortness of breath and wheezing.   Skin: Positive for rash. Negative for wound.  Neurological: Negative for dizziness, facial asymmetry, weakness, numbness and headaches.       Objective:   Physical Exam BP 118/72  Pulse 103  Temp(Src) 97.7 F (36.5 C)  Resp 14  Wt 234 lb (106.142 kg)  BMI 37.79 kg/m2  SpO2 93% Wt Readings from Last 3 Encounters:  03/08/12 234 lb (106.142 kg)  03/05/12 232 lb 6 oz (105.405 kg)  02/27/12 238 lb 8 oz (108.183 kg)   General: Uncomfortable because of itching, nontoxic Skin: Hives and patchy redness notable on proximal upper  extremities and axilla, thighs and groin. Also mild involvement of face -no evidence for involvement of trunk on abdomen chest or back at this time the patient reports itching sensation Lungs: Few crackles at right base, good air movement and no wheeze  Cardiovascular:RRR, no edema HENT: No angioedema Neuro: Awake, alert, oriented x4. Cranial nerves II through XII symmetrically intact. No gross motor deficits  Lab Results  Component Value Date   WBC 8.5 08/22/2011   HGB 14.4 08/22/2011   HCT 44.8 08/22/2011   PLT 276.0 08/22/2011   GLUCOSE 146* 02/27/2012   CHOL 154 02/27/2012   TRIG 288.0* 02/27/2012   HDL 48.20 02/27/2012   LDLDIRECT 73.1 02/27/2012   LDLCALC 37 08/22/2011   ALT 26 02/27/2012   AST 21 02/27/2012   NA 141 02/27/2012   K 4.1 02/27/2012   CL 104 02/27/2012   CREATININE 0.8 02/27/2012   BUN 11 02/27/2012   CO2 28 02/27/2012   TSH 0.74 08/22/2011   INR 1.19 07/08/2010   HGBA1C 7.4* 02/27/2012   MICROALBUR 1.3 08/22/2011    Dg Chest 2 View  03/05/2012  *RADIOLOGY REPORT*  Clinical Data: 67 year old female cough shortness of breath and chest pain.  CHEST - 2 VIEW  Comparison: 04/26/2010.  Findings: Larger lung volumes on both views.  Cardiac size and mediastinal contours are within normal limits.  Visualized tracheal air column is within normal limits.  Upper lobes are clear.  No pneumothorax, pulmonary edema or pleural effusion.  Minimal basilar streaky opacity on the frontal view, more so on the right. No acute osseous abnormality identified.  Lumbar fusion hardware partially visible.  IMPRESSION: Mild streaky basilar opacity greater on the right suspicious for developing pneumonia in this setting.   Original Report Authenticated By: Erskine Speed, M.D.       Assessment & Plan:   Urticaria and itching from adverse drug reaction. Suspect penicillin reaction Stop Augmentin IM Medrol today Atarax when necessary -erx done Allergy list updated and patient educated on same  Right lower lobe  pneumonia -change antibiotic to Levaquin therapy  Patient agrees to call if symptoms worse or unimproved

## 2012-03-11 NOTE — Assessment & Plan Note (Signed)
stable overall by history and exam, recent data reviewed with pt, and pt to continue medical treatment as before,  to f/u any worsening symptoms or concerns Lab Results  Component Value Date   HGBA1C 7.4* 02/27/2012

## 2012-03-11 NOTE — Assessment & Plan Note (Signed)
stable overall by history and exam, recent data reviewed with pt, and pt to continue medical treatment as before,  to f/u any worsening symptoms or concerns BP Readings from Last 3 Encounters:  03/08/12 118/72  03/05/12 104/70  02/27/12 102/70

## 2012-03-13 ENCOUNTER — Telehealth: Payer: Self-pay

## 2012-03-13 MED ORDER — FLUCONAZOLE 150 MG PO TABS: ORAL_TABLET | ORAL | Status: DC

## 2012-03-13 NOTE — Telephone Encounter (Signed)
Pt called requesting Rx for Diflucan. Pt has develop a yeast infection since starting ABX

## 2012-03-13 NOTE — Telephone Encounter (Signed)
Done erx 

## 2012-03-14 NOTE — Telephone Encounter (Signed)
Patient informed. 

## 2012-04-06 ENCOUNTER — Ambulatory Visit (INDEPENDENT_AMBULATORY_CARE_PROVIDER_SITE_OTHER): Payer: Medicare Other | Admitting: Internal Medicine

## 2012-04-06 ENCOUNTER — Encounter: Payer: Self-pay | Admitting: Internal Medicine

## 2012-04-06 VITALS — BP 110/62 | HR 81 | Temp 98.3°F | Ht 66.0 in | Wt 234.5 lb

## 2012-04-06 DIAGNOSIS — M545 Low back pain, unspecified: Secondary | ICD-10-CM

## 2012-04-06 DIAGNOSIS — I10 Essential (primary) hypertension: Secondary | ICD-10-CM | POA: Diagnosis not present

## 2012-04-06 DIAGNOSIS — E119 Type 2 diabetes mellitus without complications: Secondary | ICD-10-CM | POA: Diagnosis not present

## 2012-04-06 DIAGNOSIS — G8929 Other chronic pain: Secondary | ICD-10-CM

## 2012-04-06 DIAGNOSIS — J019 Acute sinusitis, unspecified: Secondary | ICD-10-CM

## 2012-04-06 MED ORDER — CLARITHROMYCIN 500 MG PO TABS: 500.0000 mg | ORAL_TABLET | Freq: Two times a day (BID) | ORAL | Status: DC

## 2012-04-06 MED ORDER — KETOROLAC TROMETHAMINE 30 MG/ML IJ SOLN
30.0000 mg | Freq: Once | INTRAMUSCULAR | Status: AC
  Administered 2012-04-06: 30 mg via INTRAMUSCULAR

## 2012-04-06 NOTE — Progress Notes (Signed)
Subjective:    Patient ID: [name], female    DOB: 11-28-1945, 67 y.o.   MRN: [id number]  HPI   Here with 2-3 days acute onset fever, facial pain, pressure, headache, general weakness and malaise, and greenish d/c, with mild ST and cough, but pt denies chest pain, wheezing, increased sob or doe, orthopnea, PND, increased LE swelling, palpitations, dizziness or syncope. Pt continues to have recurring LBP, mild worse today, without bowel or bladder change, fever, wt loss,  worsening LE pain/numbness/weakness, gait change or falls.   Pt denies polydipsia, polyuria, or low sugar symptoms such as weakness or confusion improved with po intake.  Pt states overall good compliance with meds, trying to follow lower cholesterol, diabetic diet, wt overall stable but little exercise however.    Past Medical History  Diagnosis Date  . Coronary artery disease     PCI of the left circumflex and LAD  . Hyperlipidemia   . Hypertension   . Obesity, unspecified     MODERATE  . Thyroid disease     HYPOTHYROIDISM  . Diabetes mellitus     TYPE II  . Anxiety state, unspecified   . Depressive disorder, not elsewhere classified   . Unspecified menopausal and postmenopausal disorder   . Simple or unspecified chronic serous otitis media   . Degeneration of lumbar or lumbosacral intervertebral disc   . Allergic rhinitis, cause unspecified   . Hx of hysterectomy   . Lower GI bleed 07/23/2010  . Chronic LBP    Past Surgical History  Procedure Laterality Date  . Total abdominal hysterectomy    . Lumbar fusion  01/2007    DR. Noel Gerold...3-LEVEL WITH FIXATION  . Parathyroidectomy    . Thyroidectomy    . Cardiac catheterization      PCI OF BOTH THE CIRCUMFLEX AND LEFT ANTERIOR DESCENDING ARTERY  . Cesarean section    . Heart stent  04-2010    X2    reports that she quit smoking about 25 years ago. Her smoking use included Cigarettes. She smoked 0.00 packs per day for 30 years. She has never used smokeless  tobacco. She reports that she does not drink alcohol or use illicit drugs. family history includes Heart attack (age of onset: 15) in her mother; Heart attack (age of onset: 5) in her father; and Heart disease in her father and mother.  There is no history of Colon cancer. Allergies  Allergen Reactions  . Doxycycline     REACTION: gi upset  . Miconazole Nitrate     REACTION: hives  . Prilosec (Omeprazole) Other (See Comments)    Chest pain  . Augmentin (Amoxicillin-Pot Clavulanate) Hives, Itching and Rash   Current Outpatient Prescriptions on File Prior to Visit  Medication Sig Dispense Refill  . aspirin EC 81 MG tablet Take4 times a week      . atenolol (TENORMIN) 25 MG tablet TAKE 1 TABLET BY MOUTH TWICE DAILY  180 tablet  3  . cetirizine (ZYRTEC) 10 MG tablet Take 1 tablet (10 mg total) by mouth daily.  90 tablet  3  . cyclobenzaprine (FLEXERIL) 5 MG tablet Take 1 tablet (5 mg total) by mouth 3 (three) times daily as needed for muscle spasms. As needed  (muscle spams)  90 tablet  1  . fluconazole (DIFLUCAN) 150 MG tablet 1 tab by mouth every 3 days as needed  2 tablet  1  . hydrOXYzine (ATARAX/VISTARIL) 10 MG tablet Take 1 tablet (10 mg total) by  mouth 3 (three) times daily as needed for itching.  30 tablet  0  . metFORMIN (GLUCOPHAGE-XR) 500 MG 24 hr tablet Take 4 tabs by mouth in the AM  360 tablet  3  . nitroGLYCERIN (NITROSTAT) 0.4 MG SL tablet Place 1 tablet (0.4 mg total) under the tongue every 5 (five) minutes as needed.  25 tablet  2  . rosuvastatin (CRESTOR) 20 MG tablet Take 1 tablet (20 mg total) by mouth daily.  90 tablet  3  . SYNTHROID 150 MCG tablet TAKE 1 TABLET BY MOUTH EVERY DAY  90 tablet  3  . traMADol (ULTRAM) 50 MG tablet Take 1 tablet (50 mg total) by mouth every 6 (six) hours as needed for pain.  60 tablet  1  . HYDROcodone-homatropine (HYCODAN) 5-1.5 MG/5ML syrup Take 5 mLs by mouth every 6 (six) hours as needed for cough.  120 mL  1  . levofloxacin (LEVAQUIN)  500 MG tablet Take 1 tablet (500 mg total) by mouth daily.  10 tablet  0   No current facility-administered medications on file prior to visit.   Review of Systems  Constitutional: Negative for unexpected weight change, or unusual diaphoresis  HENT: Negative for tinnitus.   Eyes: Negative for photophobia and visual disturbance.  Respiratory: Negative for choking and stridor.   Gastrointestinal: Negative for vomiting and blood in stool.  Genitourinary: Negative for hematuria and decreased urine volume.  Musculoskeletal: Negative for acute joint swelling Skin: Negative for color change and wound.  Neurological: Negative for tremors and numbness other than noted  Psychiatric/Behavioral: Negative for decreased concentration or  hyperactivity.       Objective:   Physical Exam BP 110/62  Pulse 81  Temp(Src) 98.3 F (36.8 C) (Oral)  Ht 5\' 6"  (1.676 m)  Wt 234 lb 8 oz (106.369 kg)  BMI 37.87 kg/m2  SpO2 93% VS noted, mild ill Constitutional: Pt appears well-developed and well-nourished.  HENT: Head: NCAT.  Right Ear: External ear normal.  Left Ear: External ear normal.  Eyes: Conjunctivae and EOM are normal. Pupils are equal, round, and reactive to light.  Bilat tm's with mild erythema.  Max sinus areas mild tender.  Pharynx with mild erythema, no exudate Neck: Normal range of motion. Neck supple.  Cardiovascular: Normal rate and regular rhythm.   Pulmonary/Chest: Effort normal and breath sounds normal.  Neurological: Pt is alert. Not confused  Skin: Skin is warm. No erythema.  Psychiatric: Pt behavior is normal. Thought content normal.     Assessment & Plan:

## 2012-04-06 NOTE — Assessment & Plan Note (Signed)
With mild flare , for toradol IM ,  to f/u any worsening symptoms or concerns

## 2012-04-06 NOTE — Patient Instructions (Signed)
You had the pain shot today Please take all new medication as prescribed - the antibiotic Please continue all other medications as before, and refills have been done if requested, including the cough medicine

## 2012-04-06 NOTE — Assessment & Plan Note (Signed)
stable overall by history and exam, recent data reviewed with pt, and pt to continue medical treatment as before,  to f/u any worsening symptoms or concerns BP Readings from Last 3 Encounters:  04/06/12 110/62  03/08/12 118/72  03/05/12 104/70

## 2012-04-06 NOTE — Assessment & Plan Note (Signed)
Mild to mod, for antibx course,  to f/u any worsening symptoms or concerns 

## 2012-04-06 NOTE — Assessment & Plan Note (Signed)
stable overall by history and exam, recent data reviewed with pt, and pt to continue medical treatment as before,  to f/u any worsening symptoms or concerns Lab Results  Component Value Date   HGBA1C 7.4* 02/27/2012

## 2012-04-21 ENCOUNTER — Other Ambulatory Visit: Payer: Self-pay | Admitting: Internal Medicine

## 2012-05-07 ENCOUNTER — Encounter: Payer: Self-pay | Admitting: *Deleted

## 2012-05-16 ENCOUNTER — Encounter: Payer: Self-pay | Admitting: Internal Medicine

## 2012-05-16 ENCOUNTER — Ambulatory Visit (INDEPENDENT_AMBULATORY_CARE_PROVIDER_SITE_OTHER): Payer: Medicare Other | Admitting: Internal Medicine

## 2012-05-16 VITALS — BP 110/70 | HR 79 | Temp 98.2°F | Ht 66.0 in | Wt 233.0 lb

## 2012-05-16 DIAGNOSIS — M545 Low back pain, unspecified: Secondary | ICD-10-CM | POA: Diagnosis not present

## 2012-05-16 DIAGNOSIS — I714 Abdominal aortic aneurysm, without rupture, unspecified: Secondary | ICD-10-CM | POA: Diagnosis not present

## 2012-05-16 DIAGNOSIS — H9201 Otalgia, right ear: Secondary | ICD-10-CM

## 2012-05-16 DIAGNOSIS — H9209 Otalgia, unspecified ear: Secondary | ICD-10-CM

## 2012-05-16 DIAGNOSIS — G8929 Other chronic pain: Secondary | ICD-10-CM | POA: Diagnosis not present

## 2012-05-16 MED ORDER — LEVOFLOXACIN 250 MG PO TABS: 250.0000 mg | ORAL_TABLET | Freq: Every day | ORAL | Status: DC

## 2012-05-16 MED ORDER — KETOROLAC TROMETHAMINE 30 MG/ML IJ SOLN: 30.0000 mg | Freq: Once | INTRAMUSCULAR | Status: DC

## 2012-05-16 MED ORDER — KETOROLAC TROMETHAMINE 30 MG/ML IJ SOLN
30.0000 mg | Freq: Once | INTRAMUSCULAR | Status: AC
  Administered 2012-05-16: 30 mg via INTRAMUSCULAR

## 2012-05-16 NOTE — Assessment & Plan Note (Signed)
Last exam sept 2012, last saw Dr Glee Arvin who was under the impression she had regular imaging f/u;  Will ask for f/u u/s but if not adequate due to body habitus may need CT

## 2012-05-16 NOTE — Patient Instructions (Signed)
You had the pain shot today Please take all new medication as prescribed - the antibiotic You can also take Mucinex (or it's generic off brand) for congestion, and tylenol as needed for pain. Please continue all other medications as before, and refills have been done if requested. Please have the pharmacy call with any other refills you may need. You will be contacted regarding the referral for: Aortic ultrasound

## 2012-05-16 NOTE — Progress Notes (Signed)
Subjective:    Patient ID: [name], female    DOB: 12-16-1945, 67 y.o.   MRN: [id number]  HPI   Here with 2-3 days acute onset fever, right ear pain and pressure, facial pain, pressure, headache, general weakness and malaise, with mild ST and cough, but pt denies chest pain, wheezing, increased sob or doe, orthopnea, PND, increased LE swelling, palpitations, dizziness or syncope.  Pt also with mild 4 days recurring right LBP without change in severity, bowel or bladder change, fever, wt loss,  worsening LE pain/numbness/weakness, gait change or falls.  Asks for AAA f/u as this appears to be due, last CT with AAA 4 cm  -sept 2012.  Denies worsening reflux, abd pain, dysphagia, n/v, bowel change or blood. Past Medical History  Diagnosis Date  . Coronary artery disease     PCI of the left circumflex and LAD  . Hyperlipidemia   . Hypertension   . Obesity, unspecified     MODERATE  . Thyroid disease     HYPOTHYROIDISM  . Diabetes mellitus     TYPE II  . Anxiety state, unspecified   . Depressive disorder, not elsewhere classified   . Unspecified menopausal and postmenopausal disorder   . Simple or unspecified chronic serous otitis media   . Degeneration of lumbar or lumbosacral intervertebral disc   . Allergic rhinitis, cause unspecified   . Hx of hysterectomy   . Lower GI bleed 07/23/2010  . Chronic LBP    Past Surgical History  Procedure Laterality Date  . Total abdominal hysterectomy    . Lumbar fusion  01/2007    DR. Noel Gerold...3-LEVEL WITH FIXATION  . Parathyroidectomy    . Thyroidectomy    . Cardiac catheterization      PCI OF BOTH THE CIRCUMFLEX AND LEFT ANTERIOR DESCENDING ARTERY  . Cesarean section    . Heart stent  04-2010    X2    reports that she quit smoking about 25 years ago. Her smoking use included Cigarettes. She smoked 0.00 packs per day for 30 years. She has never used smokeless tobacco. She reports that she does not drink alcohol or use illicit drugs. family  history includes Heart attack (age of onset: 54) in her mother; Heart attack (age of onset: 25) in her father; and Heart disease in her father and mother.  There is no history of Colon cancer. Allergies  Allergen Reactions  . Doxycycline     REACTION: gi upset  . Miconazole Nitrate     REACTION: hives  . Prilosec (Omeprazole) Other (See Comments)    Chest pain  . Augmentin (Amoxicillin-Pot Clavulanate) Hives, Itching and Rash   Current Outpatient Prescriptions on File Prior to Visit  Medication Sig Dispense Refill  . aspirin EC 81 MG tablet Take4 times a week      . atenolol (TENORMIN) 25 MG tablet TAKE 1 TABLET BY MOUTH TWICE DAILY  180 tablet  3  . cyclobenzaprine (FLEXERIL) 5 MG tablet Take 1 tablet (5 mg total) by mouth 3 (three) times daily as needed for muscle spasms. As needed  (muscle spams)  90 tablet  1  . fluconazole (DIFLUCAN) 150 MG tablet 1 tab by mouth every 3 days as needed  2 tablet  1  . HYDROcodone-homatropine (HYCODAN) 5-1.5 MG/5ML syrup Take 5 mLs by mouth every 6 (six) hours as needed for cough.  120 mL  1  . hydrOXYzine (ATARAX/VISTARIL) 10 MG tablet Take 1 tablet (10 mg total) by mouth 3 (  three) times daily as needed for itching.  30 tablet  0  . metFORMIN (GLUCOPHAGE-XR) 500 MG 24 hr tablet Take 4 tabs by mouth in the AM  360 tablet  3  . nitroGLYCERIN (NITROSTAT) 0.4 MG SL tablet Place 1 tablet (0.4 mg total) under the tongue every 5 (five) minutes as needed.  25 tablet  2  . SYNTHROID 150 MCG tablet TAKE 1 TABLET BY MOUTH EVERY DAY  90 tablet  3  . traMADol (ULTRAM) 50 MG tablet Take 1 tablet (50 mg total) by mouth every 6 (six) hours as needed for pain.  60 tablet  1  . cetirizine (ZYRTEC) 10 MG tablet Take 1 tablet (10 mg total) by mouth daily.  90 tablet  3  . clarithromycin (BIAXIN) 500 MG tablet Take 1 tablet (500 mg total) by mouth 2 (two) times daily.  28 tablet  0  . rosuvastatin (CRESTOR) 20 MG tablet Take 1 tablet (20 mg total) by mouth daily.  90 tablet   3   No current facility-administered medications on file prior to visit.   Review of Systems  Constitutional: Negative for unexpected weight change, or unusual diaphoresis  HENT: Negative for tinnitus.   Eyes: Negative for photophobia and visual disturbance.  Respiratory: Negative for choking and stridor.   Gastrointestinal: Negative for vomiting and blood in stool.  Genitourinary: Negative for hematuria and decreased urine volume.  Musculoskeletal: Negative for acute joint swelling Skin: Negative for color change and wound.  Neurological: Negative for tremors and numbness other than noted  Psychiatric/Behavioral: Negative for decreased concentration or  hyperactivity.       Objective:   Physical Exam BP 110/70  Pulse 79  Temp(Src) 98.2 F (36.8 C) (Oral)  Ht 5\' 6"  (1.676 m)  Wt 233 lb (105.688 kg)  BMI 37.63 kg/m2  SpO2 95% VS noted, mild ill Constitutional: Pt appears well-developed and well-nourished.  HENT: Head: NCAT.  Right Ear: External ear normal.  Left Ear: External ear normal.  Bilat tm's  - right >> left, with mildto mod erythema.  Max sinus areas mild tender.  Pharynx with mild erythema, no exudate Eyes: Conjunctivae and EOM are normal. Pupils are equal, round, and reactive to light.  Neck: Normal range of motion. Neck supple.  Cardiovascular: Normal rate and regular rhythm.   Pulmonary/Chest: Effort normal and breath sounds normal.  Abd; soft, NT, +BS, no palpable mass, no bruit Spine: nontender, has right lumbar paravertebral tender/spasm noted Neurological: Pt is alert. Not confused , motor intact Skin: Skin is warm. No erythema. dorsalis pedis 1+ bilat Psychiatric: Pt behavior is normal. Thought content normal.     Assessment & Plan:

## 2012-05-16 NOTE — Assessment & Plan Note (Signed)
C/w likely infectious - Mild to mod, for antibx course,  to f/u any worsening symptoms or concerns

## 2012-05-16 NOTE — Assessment & Plan Note (Signed)
Mild to mod, for toradol IM today as helped last visit, cont current meds,  to f/u any worsening symptoms or concerns

## 2012-05-22 ENCOUNTER — Other Ambulatory Visit: Payer: Self-pay | Admitting: Internal Medicine

## 2012-05-22 ENCOUNTER — Ambulatory Visit
Admission: RE | Admit: 2012-05-22 | Discharge: 2012-05-22 | Disposition: A | Discharge status: Home / Self Care | Payer: Medicare Other | Source: Ambulatory Visit | Attending: Internal Medicine | Admitting: Internal Medicine

## 2012-05-22 DIAGNOSIS — I714 Abdominal aortic aneurysm, without rupture, unspecified: Secondary | ICD-10-CM | POA: Diagnosis not present

## 2012-06-14 ENCOUNTER — Telehealth: Payer: Self-pay

## 2012-06-14 MED ORDER — HYDROCODONE-ACETAMINOPHEN 10-325 MG PO TABS: 1.0000 | ORAL_TABLET | Freq: Two times a day (BID) | ORAL | Status: DC | PRN

## 2012-06-14 NOTE — Telephone Encounter (Signed)
Done hardcopy to [facility]  

## 2012-06-14 NOTE — Telephone Encounter (Signed)
Refill request for hydrocodone APAP [phone]

## 2012-06-15 NOTE — Telephone Encounter (Signed)
Faxed hardcopy to [facility] 

## 2012-06-18 ENCOUNTER — Encounter: Payer: Self-pay | Admitting: Vascular Surgery

## 2012-06-19 ENCOUNTER — Ambulatory Visit (INDEPENDENT_AMBULATORY_CARE_PROVIDER_SITE_OTHER): Payer: Medicare Other | Admitting: Vascular Surgery

## 2012-06-19 ENCOUNTER — Encounter: Payer: Self-pay | Admitting: Vascular Surgery

## 2012-06-19 VITALS — BP 107/82 | HR 81 | Resp 16 | Ht 66.0 in | Wt 236.0 lb

## 2012-06-19 DIAGNOSIS — Z48812 Encounter for surgical aftercare following surgery on the circulatory system: Secondary | ICD-10-CM

## 2012-06-19 DIAGNOSIS — I714 Abdominal aortic aneurysm, without rupture, unspecified: Secondary | ICD-10-CM

## 2012-06-19 DIAGNOSIS — I739 Peripheral vascular disease, unspecified: Secondary | ICD-10-CM | POA: Diagnosis not present

## 2012-06-19 DIAGNOSIS — M79609 Pain in unspecified limb: Secondary | ICD-10-CM | POA: Diagnosis not present

## 2012-06-19 NOTE — Progress Notes (Signed)
The patient presents today for continued followup of known infrarenal abdominal aortic aneurysm. I had seen her one year ago with a 3.9 cm aneurysm. She recently had an ultrasound showing expansion to 4.2 cm. She does have some chronic degenerative disc disease with back pain but no new abdominal pain. She has remained stable from an overall medical standpoint Past Medical History  Diagnosis Date  . Coronary artery disease     PCI of the left circumflex and LAD  . Hyperlipidemia   . Hypertension   . Obesity, unspecified     MODERATE  . Thyroid disease     HYPOTHYROIDISM  . Diabetes mellitus     TYPE II  . Anxiety state, unspecified   . Depressive disorder, not elsewhere classified   . Unspecified menopausal and postmenopausal disorder   . Simple or unspecified chronic serous otitis media   . Degeneration of lumbar or lumbosacral intervertebral disc   . Allergic rhinitis, cause unspecified   . Hx of hysterectomy   . Lower GI bleed 07/23/2010  . Chronic LBP     History  Substance Use Topics  . Smoking status: Former Smoker -- 30 years    Types: Cigarettes    Quit date: 05/31/1986  . Smokeless tobacco: Never Used  . Alcohol Use: No    Family History  Problem Relation Age of Onset  . Heart attack Mother 55    s/p D&C-CARDIAC ARREST 1966  . Heart disease Mother   . Heart attack Father 30    1978 WITH MI  . Heart disease Father   . Colon cancer Neg Hx   . Diabetes Brother   . Diabetes Maternal Aunt   . Arthritis Maternal Aunt   . Cancer Maternal Aunt     Allergies  Allergen Reactions  . Doxycycline     REACTION: gi upset  . Miconazole Nitrate     REACTION: hives  . Prilosec (Omeprazole) Other (See Comments)    Chest pain  . Augmentin (Amoxicillin-Pot Clavulanate) Hives, Itching and Rash    Current outpatient prescriptions:aspirin EC 81 MG tablet, Take4 times a week, Disp: , Rfl: ;  atenolol (TENORMIN) 25 MG tablet, TAKE 1 TABLET BY MOUTH TWICE DAILY, Disp: 180  tablet, Rfl: 3;  cyclobenzaprine (FLEXERIL) 5 MG tablet, Take 1 tablet (5 mg total) by mouth 3 (three) times daily as needed for muscle spasms. As needed  (muscle spams), Disp: 90 tablet, Rfl: 1 HYDROcodone-acetaminophen (NORCO) 10-325 MG per tablet, Take 1 tablet by mouth 2 (two) times daily as needed for pain., Disp: 60 tablet, Rfl: 1;  hydrOXYzine (ATARAX/VISTARIL) 10 MG tablet, Take 1 tablet (10 mg total) by mouth 3 (three) times daily as needed for itching., Disp: 30 tablet, Rfl: 0;  metFORMIN (GLUCOPHAGE-XR) 500 MG 24 hr tablet, Take 4 tabs by mouth in the AM, Disp: 360 tablet, Rfl: 3 nitroGLYCERIN (NITROSTAT) 0.4 MG SL tablet, Place 1 tablet (0.4 mg total) under the tongue every 5 (five) minutes as needed., Disp: 25 tablet, Rfl: 2;  SYNTHROID 150 MCG tablet, TAKE 1 TABLET BY MOUTH EVERY DAY, Disp: 90 tablet, Rfl: 3;  traMADol (ULTRAM) 50 MG tablet, Take 1 tablet (50 mg total) by mouth every 6 (six) hours as needed for pain., Disp: 60 tablet, Rfl: 1 cetirizine (ZYRTEC) 10 MG tablet, Take 1 tablet (10 mg total) by mouth daily., Disp: 90 tablet, Rfl: 3;  clarithromycin (BIAXIN) 500 MG tablet, Take 1 tablet (500 mg total) by mouth 2 (two) times daily., Disp: 28  tablet, Rfl: 0;  fluconazole (DIFLUCAN) 150 MG tablet, 1 tab by mouth every 3 days as needed, Disp: 2 tablet, Rfl: 1 HYDROcodone-homatropine (HYCODAN) 5-1.5 MG/5ML syrup, Take 5 mLs by mouth every 6 (six) hours as needed for cough., Disp: 120 mL, Rfl: 1;  levofloxacin (LEVAQUIN) 250 MG tablet, Take 1 tablet (250 mg total) by mouth daily., Disp: 10 tablet, Rfl: 0;  rosuvastatin (CRESTOR) 20 MG tablet, Take 1 tablet (20 mg total) by mouth daily., Disp: 90 tablet, Rfl: 3  BP 107/82  Pulse 81  Resp 16  Ht 5\' 6"  (1.676 m)  Wt 236 lb (107.049 kg)  BMI 38.11 kg/m2  SpO2 98%  Body mass index is 38.11 kg/(m^2).       Physical exam: Developed well-nourished female in no acute distress Pulse status 2+ radial and 2+ posterior tibial pulses  bilaterally no palpable peripheral aneurysm Respirations nonlabored Neurologically she is grossly intact Abdomen: Moderate obesity no tenderness and no aneurysm palpable palpable She does have some tenderness in her left lateral thigh with no mass present. This is been present for 2-3 day  Ultrasound was reviewed with increase in diameter 4.2 cm  Impression and plan: Continue slow expansion of moderate abdominal aortic aneurysm. I did discuss the etiology of this with the patient and her daughter present. Did explain the symptoms of leaking aneurysm but explained this would be quite unlikely with a 4.2 cm aneurysm. We will see her again in one year with ultrasound at that time for continued evaluation. Regarding her left lateral thigh pain. I suspect this is musculoskeletal and she will continue to do symptomatically with a leave.

## 2012-06-19 NOTE — Addendum Note (Signed)
Addended by: Adria Dill L on: 06/19/2012 12:43 PM   Modules accepted: Orders

## 2012-06-20 ENCOUNTER — Telehealth: Payer: Self-pay | Admitting: Internal Medicine

## 2012-06-20 NOTE — Telephone Encounter (Signed)
Pt called back to say the refill has already been done.

## 2012-06-20 NOTE — Telephone Encounter (Signed)
Please call [name] about a refill.  Pharmacy told her to call.  It is for her pain medicine.  She needs a refill.

## 2012-07-02 ENCOUNTER — Ambulatory Visit (INDEPENDENT_AMBULATORY_CARE_PROVIDER_SITE_OTHER): Payer: Medicare Other | Admitting: Internal Medicine

## 2012-07-02 ENCOUNTER — Encounter: Payer: Self-pay | Admitting: Internal Medicine

## 2012-07-02 VITALS — BP 120/72 | HR 70 | Temp 97.6°F | Ht 66.0 in | Wt 234.0 lb

## 2012-07-02 DIAGNOSIS — M545 Low back pain, unspecified: Secondary | ICD-10-CM

## 2012-07-02 DIAGNOSIS — I10 Essential (primary) hypertension: Secondary | ICD-10-CM

## 2012-07-02 DIAGNOSIS — E119 Type 2 diabetes mellitus without complications: Secondary | ICD-10-CM

## 2012-07-02 MED ORDER — PREDNISONE 10 MG PO TABS: ORAL_TABLET | ORAL | Status: DC

## 2012-07-02 MED ORDER — MEPERIDINE HCL 50 MG/ML IJ SOLN
50.0000 mg | Freq: Once | INTRAMUSCULAR | Status: AC
  Administered 2012-07-02: 50 mg via INTRAMUSCULAR

## 2012-07-02 MED ORDER — OXYCODONE HCL 5 MG PO TABS: ORAL_TABLET | ORAL | Status: DC

## 2012-07-02 MED ORDER — PROMETHAZINE HCL 25 MG/ML IJ SOLN
25.0000 mg | Freq: Once | INTRAMUSCULAR | Status: AC
  Administered 2012-07-02: 25 mg via INTRAMUSCULAR

## 2012-07-02 MED ORDER — CYCLOBENZAPRINE HCL 5 MG PO TABS: 5.0000 mg | ORAL_TABLET | Freq: Three times a day (TID) | ORAL | Status: DC | PRN

## 2012-07-02 NOTE — Patient Instructions (Signed)
Hold on taking the hydrocodone You had the pain shot today (demerol/phenergan) Please take all new medication as prescribed - the oxycodone, muscle relaxer and prednisone Please continue all other medications as before, Please have the pharmacy call with any other refills you may need.  Thank you for enrolling in MyChart. Please follow the instructions below to securely access your online medical record. MyChart allows you to send messages to your doctor, view your test results, renew your prescriptions, schedule appointments, and more.

## 2012-07-02 NOTE — Progress Notes (Signed)
Subjective:    Patient ID: [name], female    DOB: 03-Nov-1945, 67 y.o.   MRN: [id number]  HPI Here to f/u, Pt c/o acute osnet 2-3 days left LBP after straightening up after bending to pick up something, but no bowel or bladder change, fever, wt loss,  worsening LE pain/numbness/weakness, gait change or falls. Pt denies chest pain, increased sob or doe, wheezing, orthopnea, PND, increased LE swelling, palpitations, dizziness or syncope.   Pt denies polydipsia, polyuria. Denies urinary symptoms such as dysuria, frequency, urgency, flank pain, hematuria or n/v, fever, chills. Past Medical History  Diagnosis Date  . Coronary artery disease     PCI of the left circumflex and LAD  . Hyperlipidemia   . Hypertension   . Obesity, unspecified     MODERATE  . Thyroid disease     HYPOTHYROIDISM  . Diabetes mellitus     TYPE II  . Anxiety state, unspecified   . Depressive disorder, not elsewhere classified   . Unspecified menopausal and postmenopausal disorder   . Simple or unspecified chronic serous otitis media   . Degeneration of lumbar or lumbosacral intervertebral disc   . Allergic rhinitis, cause unspecified   . Hx of hysterectomy   . Lower GI bleed 07/23/2010  . Chronic LBP    Past Surgical History  Procedure Laterality Date  . Total abdominal hysterectomy    . Lumbar fusion  01/2007    DR. Noel Gerold...3-LEVEL WITH FIXATION  . Parathyroidectomy    . Thyroidectomy    . Cardiac catheterization      PCI OF BOTH THE CIRCUMFLEX AND LEFT ANTERIOR DESCENDING ARTERY  . Cesarean section    . Heart stent  04-2010    X2  . Spine surgery      reports that she quit smoking about 26 years ago. Her smoking use included Cigarettes. She smoked 0.00 packs per day for 30 years. She has never used smokeless tobacco. She reports that she does not drink alcohol or use illicit drugs. family history includes Arthritis in her maternal aunt; Cancer in her maternal aunt; Diabetes in her brother and maternal  aunt; Heart attack (age of onset: 59) in her mother; Heart attack (age of onset: 74) in her father; and Heart disease in her father and mother.  There is no history of Colon cancer. Allergies  Allergen Reactions  . Doxycycline     REACTION: gi upset  . Miconazole Nitrate     REACTION: hives  . Prilosec (Omeprazole) Other (See Comments)    Chest pain  . Augmentin (Amoxicillin-Pot Clavulanate) Hives, Itching and Rash   Current Outpatient Prescriptions on File Prior to Visit  Medication Sig Dispense Refill  . aspirin EC 81 MG tablet Take4 times a week      . atenolol (TENORMIN) 25 MG tablet TAKE 1 TABLET BY MOUTH TWICE DAILY  180 tablet  3  . clarithromycin (BIAXIN) 500 MG tablet Take 1 tablet (500 mg total) by mouth 2 (two) times daily.  28 tablet  0  . fluconazole (DIFLUCAN) 150 MG tablet 1 tab by mouth every 3 days as needed  2 tablet  1  . HYDROcodone-acetaminophen (NORCO) 10-325 MG per tablet Take 1 tablet by mouth 2 (two) times daily as needed for pain.  60 tablet  1  . HYDROcodone-homatropine (HYCODAN) 5-1.5 MG/5ML syrup Take 5 mLs by mouth every 6 (six) hours as needed for cough.  120 mL  1  . hydrOXYzine (ATARAX/VISTARIL) 10 MG tablet Take  1 tablet (10 mg total) by mouth 3 (three) times daily as needed for itching.  30 tablet  0  . metFORMIN (GLUCOPHAGE-XR) 500 MG 24 hr tablet Take 4 tabs by mouth in the AM  360 tablet  3  . nitroGLYCERIN (NITROSTAT) 0.4 MG SL tablet Place 1 tablet (0.4 mg total) under the tongue every 5 (five) minutes as needed.  25 tablet  2  . SYNTHROID 150 MCG tablet TAKE 1 TABLET BY MOUTH EVERY DAY  90 tablet  3  . traMADol (ULTRAM) 50 MG tablet Take 1 tablet (50 mg total) by mouth every 6 (six) hours as needed for pain.  60 tablet  1  . cetirizine (ZYRTEC) 10 MG tablet Take 1 tablet (10 mg total) by mouth daily.  90 tablet  3  . rosuvastatin (CRESTOR) 20 MG tablet Take 1 tablet (20 mg total) by mouth daily.  90 tablet  3   No current facility-administered  medications on file prior to visit.   Review of Systems  Constitutional: Negative for unexpected weight change, or unusual diaphoresis  HENT: Negative for tinnitus.   Eyes: Negative for photophobia and visual disturbance.  Respiratory: Negative for choking and stridor.   Gastrointestinal: Negative for vomiting and blood in stool.  Genitourinary: Negative for hematuria and decreased urine volume.  Musculoskeletal: Negative for acute joint swelling Skin: Negative for color change and wound.  Neurological: Negative for tremors and numbness other than noted  Psychiatric/Behavioral: Negative for decreased concentration or  hyperactivity.       Objective:   Physical Exam BP 120/72  Pulse 70  Temp(Src) 97.6 F (36.4 C) (Oral)  Ht 5\' 6"  (1.676 m)  Wt 234 lb (106.142 kg)  BMI 37.79 kg/m2  SpO2 96% VS noted, obese, not ill appearing Constitutional: Pt appears well-developed and well-nourished.  HENT: Head: NCAT.  Right Ear: External ear normal.  Left Ear: External ear normal.  Eyes: Conjunctivae and EOM are normal. Pupils are equal, round, and reactive to light.  Neck: Normal range of motion. Neck supple.  Cardiovascular: Normal rate and regular rhythm.   Pulmonary/Chest: Effort normal and breath sounds normal.  Abd:  Soft, NT, non-distended, + BS Spine nontender, but has left paravertebral spasm and left upper buttock area tender without swelling, rash Neurological: Pt is alert. Not confused , motor 5/5, dtr intact Skin: Skin is warm. No erythema.  Psychiatric: Pt behavior is normal. Thought content normal.     Assessment & Plan:

## 2012-07-02 NOTE — Assessment & Plan Note (Signed)
Acute left with few radicular fleeting pains to the distal LLE and no new neuro changes for now, has hx of extensvie lumbar djd/ddd/spinal stenosis s/p surgury x 1, for demerol/phenergan 50/25, hold hydrocodone, percocet prn/muscle relaxer/predpack asd, consider f/u with Dr Noel Gerold

## 2012-07-02 NOTE — Assessment & Plan Note (Signed)
stable overall by history and exam, recent data reviewed with pt, and pt to continue medical treatment as before,  to f/u any worsening symptoms or concerns BP Readings from Last 3 Encounters:  07/02/12 120/72  06/19/12 107/82  05/16/12 110/70

## 2012-07-02 NOTE — Assessment & Plan Note (Signed)
For close f/u at home with predpack, call for cbg > 200 or polys,  to f/u any worsening symptoms or concerns Lab Results  Component Value Date   HGBA1C 7.4* 02/27/2012

## 2012-07-03 ENCOUNTER — Telehealth: Payer: Self-pay

## 2012-07-03 MED ORDER — LEVOFLOXACIN 250 MG PO TABS: 250.0000 mg | ORAL_TABLET | Freq: Every day | ORAL | Status: DC

## 2012-07-03 NOTE — Telephone Encounter (Signed)
Patient informed. 

## 2012-07-03 NOTE — Telephone Encounter (Signed)
The patient has had burning with urination today (had some yesterday, but failed to mention at OV), still having flank pain and itching as well.  She states she feels like she did with her previous UTI  Please advise

## 2012-07-03 NOTE — Telephone Encounter (Signed)
Ok this time only for levaquin x 7 days - done erx

## 2012-08-10 IMAGING — CT CT ANGIO CHEST
1 of 9 series · 16 of 36 positions shown · IV contrast (CONTRAST)
Comparison: Chest x-ray 05/01/2009

CLINICAL DATA: Chest pain began 2 days ago.  Radiates to the left
arm.  Elevated D-dimer.  History of CAD, stents, hyperlipidemia,
diabetes, hypothyroidism, back surgery, hysterectomy, thyroidectomy

CT ANGIOGRAPHY CHEST WITH CONTRAST
TECHNIQUE: Multidetector CT imaging of the chest was performed
using the standard protocol during bolus administration of
intravenous contrast.  Multiplanar CT image reconstructions
including MIPs were obtained to evaluate the vascular anatomy.
Contrast:  100 ml Emnipaque-0RR

[Series 6: pe thins · axial · 0.85mm/px · z∈[-282,-37]mm · 16 of 279 slices shown]
[im 17/279  lung]
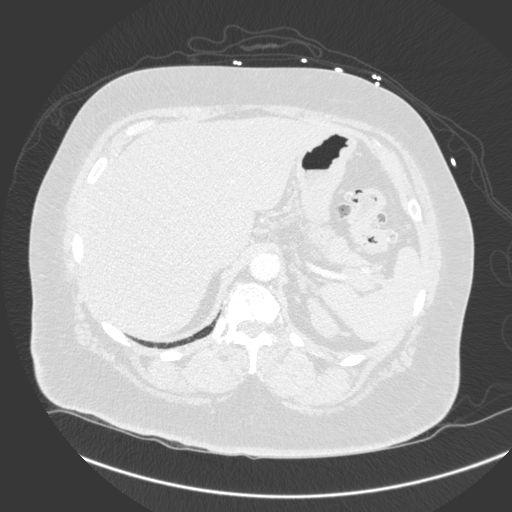
[im 33/279  mediastinal]
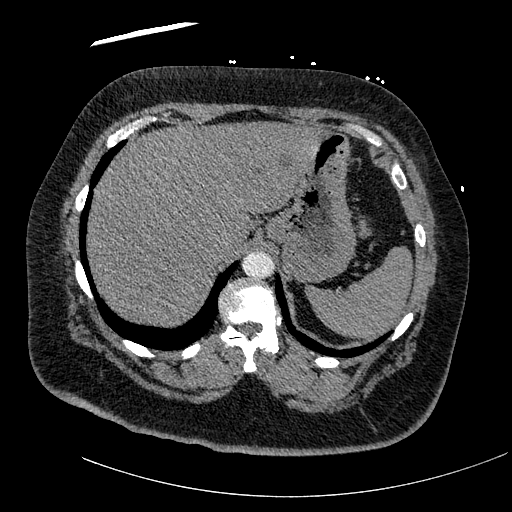
[im 50/279  lung]
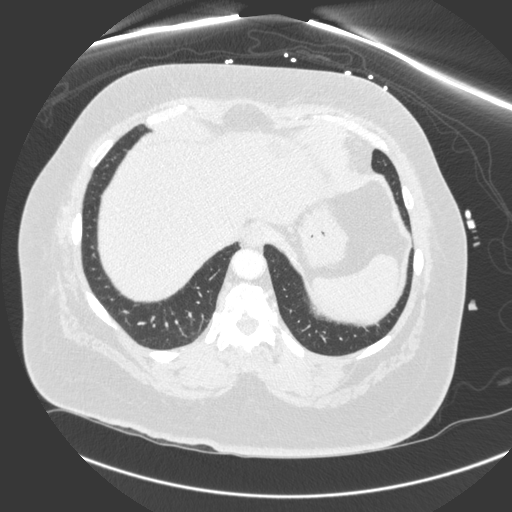
[im 66/279  mediastinal]
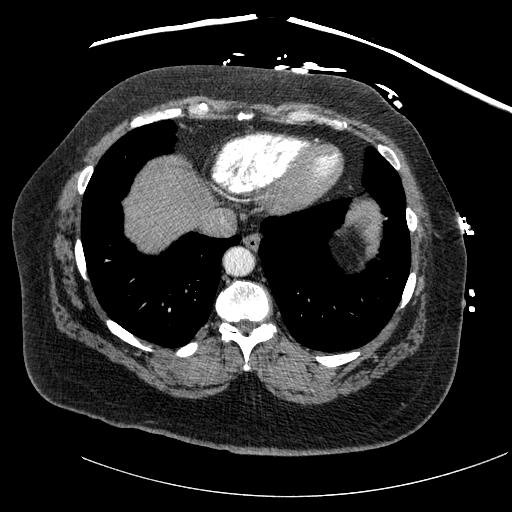
[im 82/279  lung]
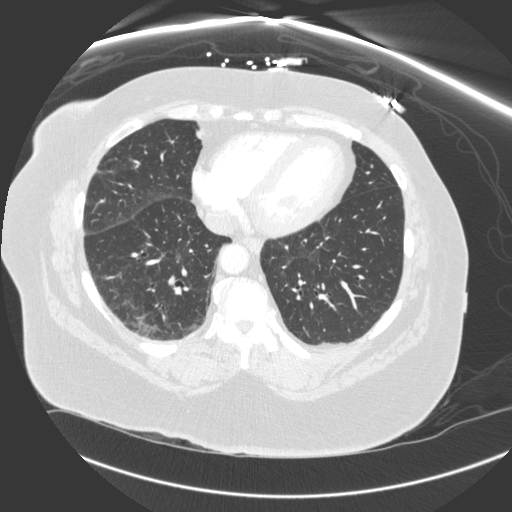
[im 99/279  mediastinal]
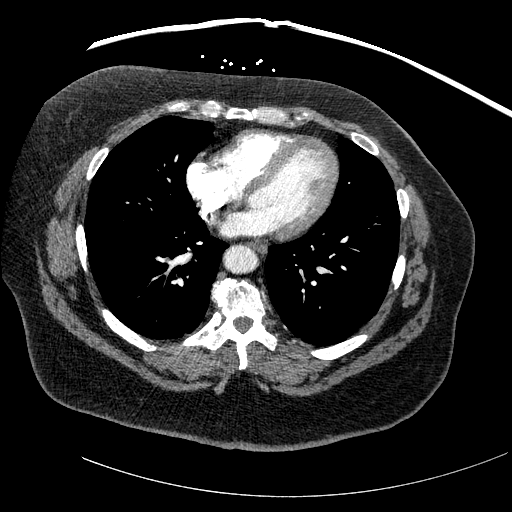
[im 115/279  lung]
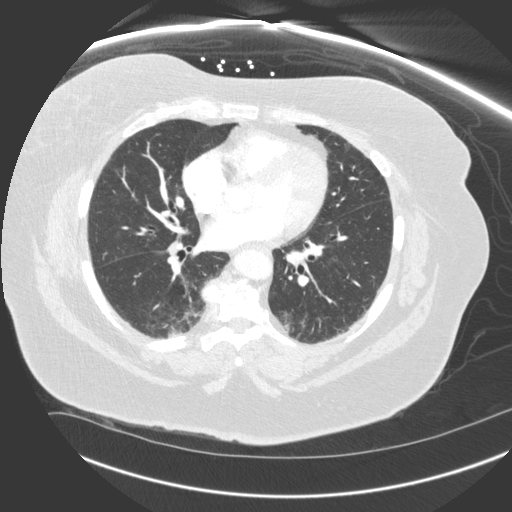
[im 131/279  mediastinal]
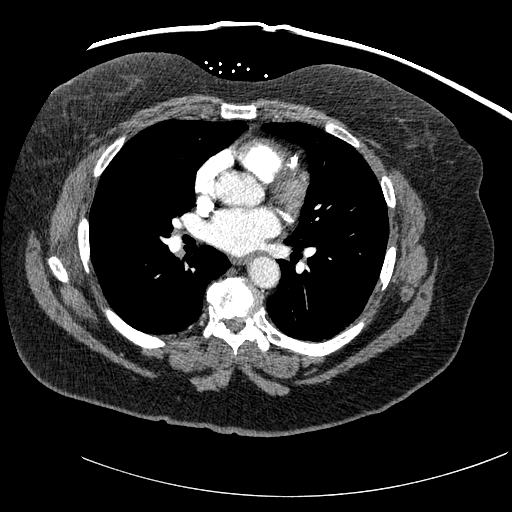
[im 148/279  lung]
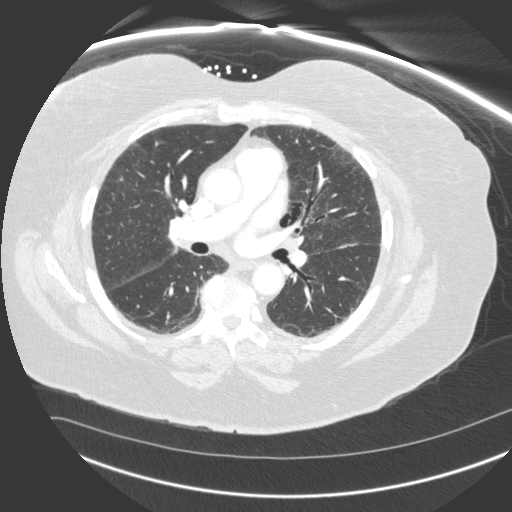
[im 164/279  mediastinal]
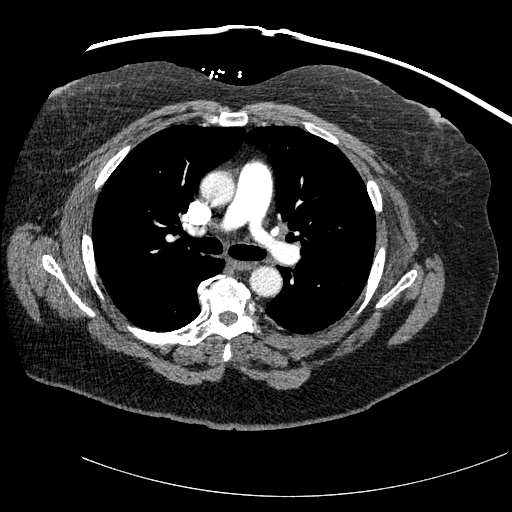
[im 180/279  lung]
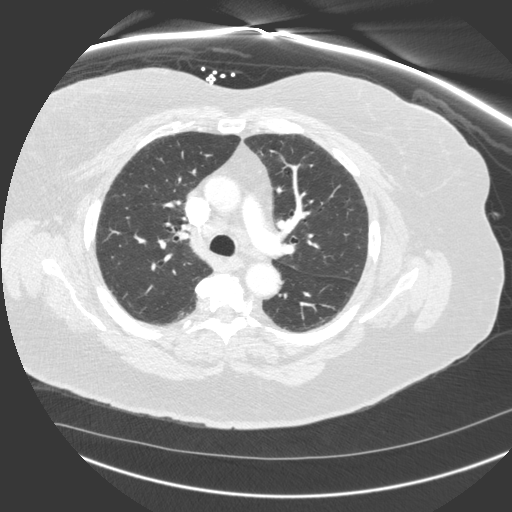
[im 197/279  mediastinal]
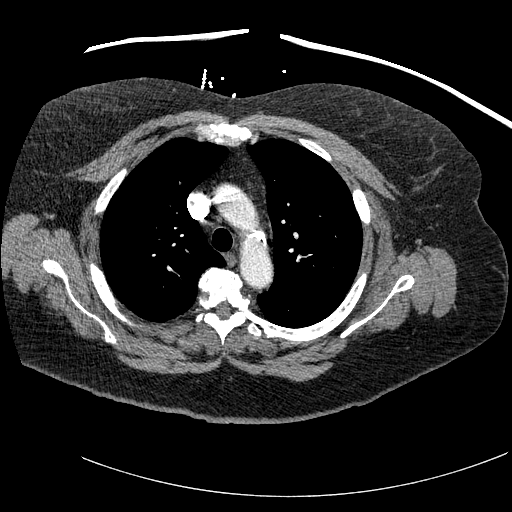
[im 213/279  lung]
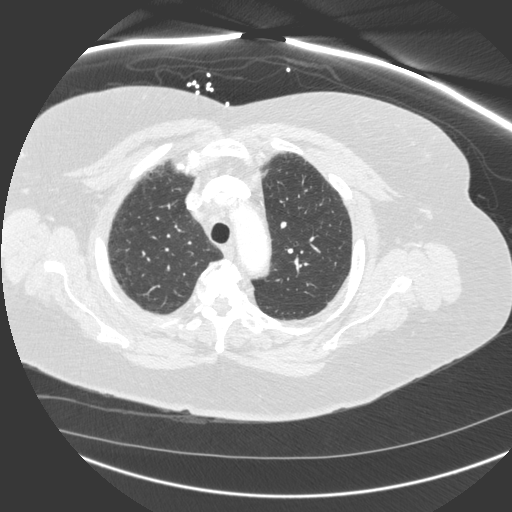
[im 229/279  mediastinal]
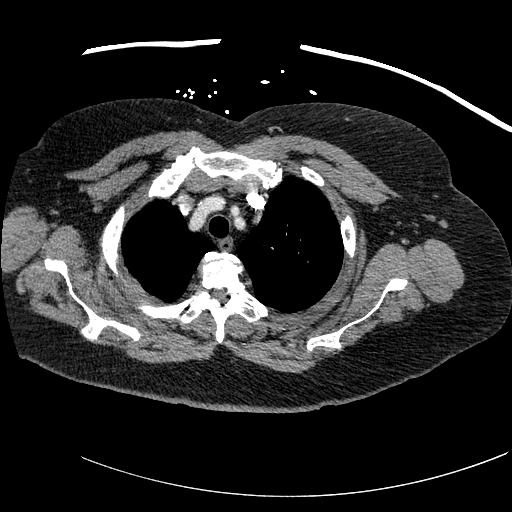
[im 246/279  lung]
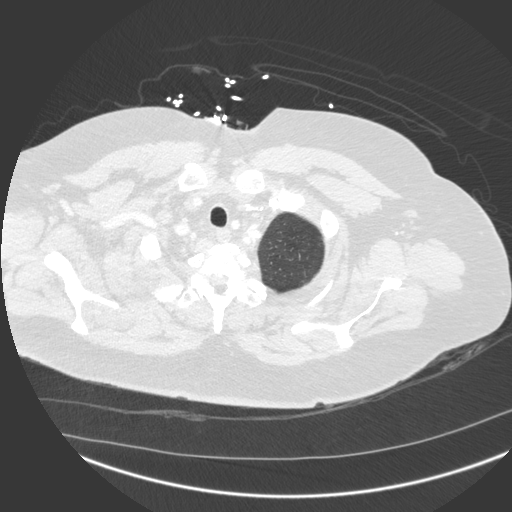
[im 262/279  mediastinal]
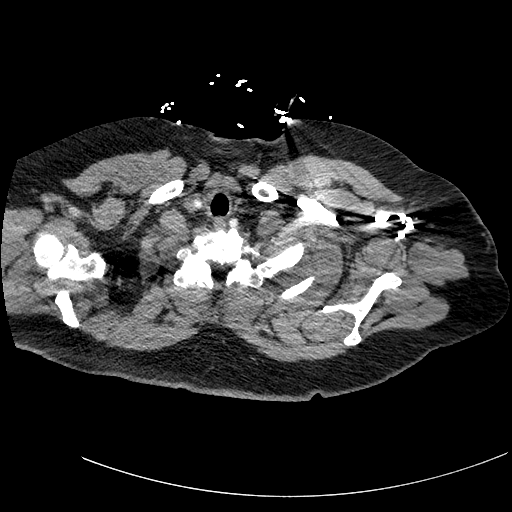

[16 of 36 positions shown; findings below may reference images not displayed]

FINDINGS: The pulmonary arteries are well opacified.  There is no
evidence for acute pulmonary embolus.  There are mediastinal and
hilar lymph nodes.  In the right hilum, node is 1.3 x 1.9 cm.  In
the subcarinal region, node is 0.9 x 1.9 cm.  In the pre carinal
region, node is 1.2 x 2.2 cm.  In the AP window region, node is
cm.  There are mild patchy infiltrates throughout the upper and
lower lobes of the lungs bilaterally.  No pulmonary nodules are
identified.

Within the left hepatic lobe, there is a liver lesion measuring
x 2.9 cm.  This is not characterized given the early phase of
contrast.  Numerous colonic diverticula are identified in the upper
abdominal cuts.  Degenerative changes are seen in the thoracic
spine.  No lytic or blastic lesions are identified.

Review of the MIP images confirms the above findings.
IMPRESSION: 1.  Technically adequate exam showing no evidence for acute
pulmonary embolus.
2.  Nonspecific mediastinal and hilar lymph nodes as described.
3.  Left hepatic lesion warrants further characterization as this
is was not seen on prior ultrasound from 6881.  MRI of the liver
with contrast is recommended.
4.  Diverticulosis.

## 2012-08-28 ENCOUNTER — Ambulatory Visit: Payer: Medicare Other | Admitting: Internal Medicine

## 2012-08-29 ENCOUNTER — Encounter (INDEPENDENT_AMBULATORY_CARE_PROVIDER_SITE_OTHER): Payer: Self-pay

## 2012-08-29 ENCOUNTER — Ambulatory Visit (INDEPENDENT_AMBULATORY_CARE_PROVIDER_SITE_OTHER): Payer: Medicare Other | Admitting: Internal Medicine

## 2012-08-29 ENCOUNTER — Other Ambulatory Visit (INDEPENDENT_AMBULATORY_CARE_PROVIDER_SITE_OTHER): Payer: Medicare Other

## 2012-08-29 ENCOUNTER — Encounter: Payer: Self-pay | Admitting: Internal Medicine

## 2012-08-29 VITALS — BP 120/68 | HR 90 | Temp 97.5°F | Ht 66.0 in | Wt 237.0 lb

## 2012-08-29 DIAGNOSIS — G8929 Other chronic pain: Secondary | ICD-10-CM

## 2012-08-29 DIAGNOSIS — I714 Abdominal aortic aneurysm, without rupture, unspecified: Secondary | ICD-10-CM

## 2012-08-29 DIAGNOSIS — E119 Type 2 diabetes mellitus without complications: Secondary | ICD-10-CM

## 2012-08-29 DIAGNOSIS — M545 Low back pain, unspecified: Secondary | ICD-10-CM

## 2012-08-29 DIAGNOSIS — I10 Essential (primary) hypertension: Secondary | ICD-10-CM

## 2012-08-29 DIAGNOSIS — R0989 Other specified symptoms and signs involving the circulatory and respiratory systems: Secondary | ICD-10-CM

## 2012-08-29 LAB — URINALYSIS, ROUTINE W REFLEX MICROSCOPIC
Bilirubin Urine: NEGATIVE
Hgb urine dipstick: NEGATIVE
Ketones, ur: NEGATIVE
Nitrite: POSITIVE
Urobilinogen, UA: 0.2 (ref 0.0–1.0)

## 2012-08-29 LAB — HEMOGLOBIN A1C: Hgb A1c MFr Bld: 7.2 % — ABNORMAL HIGH (ref 4.6–6.5)

## 2012-08-29 LAB — HM MAMMOGRAPHY

## 2012-08-29 MED ORDER — OXYCODONE HCL 5 MG PO TABS: ORAL_TABLET | ORAL | Status: DC

## 2012-08-29 MED ORDER — MEPERIDINE HCL 50 MG/ML IJ SOLN
50.0000 mg | Freq: Once | INTRAMUSCULAR | Status: AC
  Administered 2012-08-29: 50 mg via INTRAMUSCULAR

## 2012-08-29 MED ORDER — PROMETHAZINE HCL 25 MG/ML IJ SOLN
25.0000 mg | Freq: Once | INTRAMUSCULAR | Status: AC
  Administered 2012-08-29: 25 mg via INTRAMUSCULAR

## 2012-08-29 NOTE — Progress Notes (Signed)
Subjective:    Patient ID: [name], female    DOB: December 13, 1945, 67 y.o.   MRN: [id number]  HPI Here with acute on chronic mid lower back pain x 2 days, severe, no bowel or bladder change, fever, wt loss,  worsening LE pain/numbness/weakness, gait change or falls. Has known lumbar deg dz, and AAA 4.2 cm  - may 2014.  Has seen Dr Early with recommendation for f/u u/s at 1 yr, but order actually is for sept 2015, and pt does not want to wait that long.  Needs chronic med fill as well.  Pt denies chest pain, increased sob or doe, wheezing, orthopnea, PND, increased LE swelling, palpitations, dizziness or syncope.  Pt denies polydipsia, polyuria, or low sugar symptoms such as weakness or confusion improved with po intake.  Pt denies new neurological symptoms such as new headache, or facial or extremity weakness or numbness.   Pt states overall good compliance with meds, has been trying to follow lower cholesterol, diabetic diet, hard to lose wt however Past Medical History  Diagnosis Date  . Coronary artery disease     PCI of the left circumflex and LAD  . Hyperlipidemia   . Hypertension   . Obesity, unspecified     MODERATE  . Thyroid disease     HYPOTHYROIDISM  . Diabetes mellitus     TYPE II  . Anxiety state, unspecified   . Depressive disorder, not elsewhere classified   . Unspecified menopausal and postmenopausal disorder   . Simple or unspecified chronic serous otitis media   . Degeneration of lumbar or lumbosacral intervertebral disc   . Allergic rhinitis, cause unspecified   . Hx of hysterectomy   . Lower GI bleed 07/23/2010  . Chronic LBP    Past Surgical History  Procedure Laterality Date  . Total abdominal hysterectomy    . Lumbar fusion  01/2007    DR. Noel Gerold...3-LEVEL WITH FIXATION  . Parathyroidectomy    . Thyroidectomy    . Cardiac catheterization      PCI OF BOTH THE CIRCUMFLEX AND LEFT ANTERIOR DESCENDING ARTERY  . Cesarean section    . Heart stent  04-2010    X2  .  Spine surgery      reports that she quit smoking about 26 years ago. Her smoking use included Cigarettes. She smoked 0.00 packs per day for 30 years. She has never used smokeless tobacco. She reports that she does not drink alcohol or use illicit drugs. family history includes Arthritis in her maternal aunt; Cancer in her maternal aunt; Diabetes in her brother and maternal aunt; Heart attack (age of onset: 7) in her mother; Heart attack (age of onset: 69) in her father; Heart disease in her father and mother. There is no history of Colon cancer. Allergies  Allergen Reactions  . Doxycycline     REACTION: gi upset  . Miconazole Nitrate     REACTION: hives  . Prilosec [Omeprazole] Other (See Comments)    Chest pain  . Augmentin [Amoxicillin-Pot Clavulanate] Hives, Itching and Rash   Current Outpatient Prescriptions on File Prior to Visit  Medication Sig Dispense Refill  . aspirin EC 81 MG tablet Take4 times a week      . atenolol (TENORMIN) 25 MG tablet TAKE 1 TABLET BY MOUTH TWICE DAILY  180 tablet  3  . cyclobenzaprine (FLEXERIL) 5 MG tablet Take 1 tablet (5 mg total) by mouth 3 (three) times daily as needed for muscle spasms. As needed  (  muscle spams)  90 tablet  1  . HYDROcodone-acetaminophen (NORCO) 10-325 MG per tablet Take 1 tablet by mouth 2 (two) times daily as needed for pain.  60 tablet  1  . hydrOXYzine (ATARAX/VISTARIL) 10 MG tablet Take 1 tablet (10 mg total) by mouth 3 (three) times daily as needed for itching.  30 tablet  0  . metFORMIN (GLUCOPHAGE-XR) 500 MG 24 hr tablet Take 4 tabs by mouth in the AM  360 tablet  3  . nitroGLYCERIN (NITROSTAT) 0.4 MG SL tablet Place 1 tablet (0.4 mg total) under the tongue every 5 (five) minutes as needed.  25 tablet  2  . oxyCODONE (OXY IR/ROXICODONE) 5 MG immediate release tablet 1-2 tabs every 4 hours as needed for pain, with limit 8 tabs per day  60 tablet  0  . SYNTHROID 150 MCG tablet TAKE 1 TABLET BY MOUTH EVERY DAY  90 tablet  3  .  traMADol (ULTRAM) 50 MG tablet Take 1 tablet (50 mg total) by mouth every 6 (six) hours as needed for pain.  60 tablet  1  . cetirizine (ZYRTEC) 10 MG tablet Take 1 tablet (10 mg total) by mouth daily.  90 tablet  3  . rosuvastatin (CRESTOR) 20 MG tablet Take 1 tablet (20 mg total) by mouth daily.  90 tablet  3   No current facility-administered medications on file prior to visit.   Review of Systems  Constitutional: Negative for unexpected weight change, or unusual diaphoresis  HENT: Negative for tinnitus.   Eyes: Negative for photophobia and visual disturbance.  Respiratory: Negative for choking and stridor.   Gastrointestinal: Negative for vomiting and blood in stool.  Genitourinary: Negative for hematuria and decreased urine volume.  Musculoskeletal: Negative for acute joint swelling Skin: Negative for color change and wound.  Neurological: Negative for tremors and numbness other than noted  Psychiatric/Behavioral: Negative for decreased concentration or  hyperactivity.       Objective:   Physical Exam BP 120/68  Pulse 90  Temp(Src) 97.5 F (36.4 C) (Oral)  Ht 5\' 6"  (1.676 m)  Wt 237 lb (107.502 kg)  BMI 38.27 kg/m2  SpO2 95% VS noted,  Constitutional: Pt appears well-developed and well-nourished.  HENT: Head: NCAT.  Right Ear: External ear normal.  Left Ear: External ear normal.  Eyes: Conjunctivae and EOM are normal. Pupils are equal, round, and reactive to light.  Neck: Normal range of motion. Neck supple.  Cardiovascular: Normal rate and regular rhythm.   Pulmonary/Chest: Effort normal and breath sounds normal.  Abd:  Soft, NT, non-distended, + BS Neurological: Pt is alert. Not confused , motor 5/5, sens.dtr intact Skin: Skin is warm. No erythema.  Psychiatric: Pt behavior is normal. Thought content normal. mild nervous Spine with midline tender chronic about l5, no swelling, redness, rash    Assessment & Plan:

## 2012-08-29 NOTE — Assessment & Plan Note (Signed)
stable overall by history and exam, recent data reviewed with pt, and pt to continue medical treatment as before,  to f/u any worsening symptoms or concerns Lab Results  Component Value Date   HGBA1C 7.4* 02/27/2012   For f/u a1c today, pt declines other labs as has mult labs done with the study she is on

## 2012-08-29 NOTE — Addendum Note (Signed)
Addended by: Corwin Levins on: 08/29/2012 02:37 PM   Modules accepted: Orders

## 2012-08-29 NOTE — Assessment & Plan Note (Signed)
stable overall by history and exam, recent data reviewed with pt, and pt to continue medical treatment as before,  to f/u any worsening symptoms or concerns BP Readings from Last 3 Encounters:  08/29/12 120/68  07/02/12 120/72  06/19/12 107/82

## 2012-08-29 NOTE — Patient Instructions (Signed)
You had the pain shot today No other new medications today Please continue all other medications as before, and refills have been done if requested, including the pain medication You will be contacted regarding the referral for: Aorta Ultrasound for November 2014 Please keep your appointments with your specialists as you have planned - Dr Early as planned Please continue your efforts at being more active, low cholesterol diet, and weight control. Please go to the LAB in the Basement (turn left off the elevator) for the tests to be done today You will be contacted by phone if any changes need to be made immediately.  Otherwise, you will receive a letter about your results with an explanation, but please check with MyChart first.   Please remember to sign up for My Chart if you have not done so, as this will be important to you in the future with finding out test results, communicating by private email, and scheduling acute appointments online when needed.  Please return in 6 months, or sooner if needed

## 2012-08-29 NOTE — Assessment & Plan Note (Signed)
Ok for f/u in 3 more months, then next yr with Dr Arbie Cookey as planned

## 2012-08-29 NOTE — Addendum Note (Signed)
Addended by: Scharlene Gloss B on: 08/29/2012 02:49 PM   Modules accepted: Orders

## 2012-08-29 NOTE — Assessment & Plan Note (Addendum)
With acute flare, similar to last visit, for demerol/phenergan which worked well last visit, cont oral pain med,  to f/u any worsening symptoms or concerns, also for UA per pt request

## 2012-09-07 ENCOUNTER — Ambulatory Visit
Admission: RE | Admit: 2012-09-07 | Discharge: 2012-09-07 | Disposition: A | Discharge status: Home / Self Care | Payer: Medicare Other | Source: Ambulatory Visit | Attending: Internal Medicine | Admitting: Internal Medicine

## 2012-09-07 ENCOUNTER — Other Ambulatory Visit: Payer: Self-pay | Admitting: Internal Medicine

## 2012-09-07 DIAGNOSIS — I714 Abdominal aortic aneurysm, without rupture, unspecified: Secondary | ICD-10-CM | POA: Diagnosis not present

## 2012-10-18 ENCOUNTER — Ambulatory Visit (INDEPENDENT_AMBULATORY_CARE_PROVIDER_SITE_OTHER): Payer: Medicare Other | Admitting: Internal Medicine

## 2012-10-18 ENCOUNTER — Encounter: Payer: Self-pay | Admitting: Internal Medicine

## 2012-10-18 VITALS — BP 92/64 | HR 81 | Temp 97.0°F | Ht 66.0 in | Wt 234.0 lb

## 2012-10-18 DIAGNOSIS — M545 Low back pain, unspecified: Secondary | ICD-10-CM | POA: Diagnosis not present

## 2012-10-18 DIAGNOSIS — I10 Essential (primary) hypertension: Secondary | ICD-10-CM

## 2012-10-18 DIAGNOSIS — Z23 Encounter for immunization: Secondary | ICD-10-CM | POA: Diagnosis not present

## 2012-10-18 DIAGNOSIS — J019 Acute sinusitis, unspecified: Secondary | ICD-10-CM

## 2012-10-18 DIAGNOSIS — G8929 Other chronic pain: Secondary | ICD-10-CM | POA: Diagnosis not present

## 2012-10-18 MED ORDER — LEVOFLOXACIN 250 MG PO TABS: 250.0000 mg | ORAL_TABLET | Freq: Every day | ORAL | Status: DC

## 2012-10-18 MED ORDER — OXYCODONE HCL 5 MG PO TABS: ORAL_TABLET | ORAL | Status: DC

## 2012-10-18 MED ORDER — KETOROLAC TROMETHAMINE 30 MG/ML IJ SOLN
30.0000 mg | Freq: Once | INTRAMUSCULAR | Status: AC
  Administered 2012-10-18: 30 mg via INTRAMUSCULAR

## 2012-10-18 NOTE — Assessment & Plan Note (Signed)
With mild flare today, exam no change, for toradol IM, pain med refill

## 2012-10-18 NOTE — Patient Instructions (Addendum)
You had the flu shot today, and the Prevnar pneumonia shot, and the toradol pain shot Please take all new medication as prescribed - the antibiotic Please continue all other medications as before, and refills have been done if requested - the pain med refill  Please remember to sign up for My Chart if you have not done so, as this will be important to you in the future with finding out test results, communicating by private email, and scheduling acute appointments online when needed.  Please return about feb 2015

## 2012-10-18 NOTE — Addendum Note (Signed)
Addended by: Scharlene Gloss B on: 10/18/2012 02:18 PM   Modules accepted: Orders

## 2012-10-18 NOTE — Progress Notes (Signed)
Subjective:    Patient ID: [name], female    DOB: [birth year], 67 y.o.   MRN: [id number]  HPI  Here with 2-3 days acute onset fever, facial pain, pressure, headache, general weakness and malaise, and greenish d/c, with mild ST and cough, but pt denies chest pain, wheezing, increased sob or doe, orthopnea, PND, increased LE swelling, palpitations, dizziness or syncope.  Pt continues to have recurring LBP without change in severity, bowel or bladder change, fever, wt loss,  worsening LE pain/numbness/weakness, gait change or falls.  Pt denies new neurological symptoms such as new headache, or facial or extremity weakness or numbness Past Medical History  Diagnosis Date  . Coronary artery disease     PCI of the left circumflex and LAD  . Hyperlipidemia   . Hypertension   . Obesity, unspecified     MODERATE  . Thyroid disease     HYPOTHYROIDISM  . Diabetes mellitus     TYPE II  . Anxiety state, unspecified   . Depressive disorder, not elsewhere classified   . Unspecified menopausal and postmenopausal disorder   . Simple or unspecified chronic serous otitis media   . Degeneration of lumbar or lumbosacral intervertebral disc   . Allergic rhinitis, cause unspecified   . Hx of hysterectomy   . Lower GI bleed 07/23/2010  . Chronic LBP    Past Surgical History  Procedure Laterality Date  . Total abdominal hysterectomy    . Lumbar fusion  01/2007    DR. Noel Gerold...3-LEVEL WITH FIXATION  . Parathyroidectomy    . Thyroidectomy    . Cardiac catheterization      PCI OF BOTH THE CIRCUMFLEX AND LEFT ANTERIOR DESCENDING ARTERY  . Cesarean section    . Heart stent  04-2010    X2  . Spine surgery      reports that she quit smoking about 26 years ago. Her smoking use included Cigarettes. She smoked 0.00 packs per day for 30 years. She has never used smokeless tobacco. She reports that she does not drink alcohol or use illicit drugs. family history includes Arthritis in her maternal aunt; Cancer  in her maternal aunt; Diabetes in her brother and maternal aunt; Heart attack (age of onset: 7) in her mother; Heart attack (age of onset: 50) in her father; Heart disease in her father and mother. There is no history of Colon cancer. Allergies  Allergen Reactions  . Doxycycline     REACTION: gi upset  . Miconazole Nitrate     REACTION: hives  . Prilosec [Omeprazole] Other (See Comments)    Chest pain  . Augmentin [Amoxicillin-Pot Clavulanate] Hives, Itching and Rash   Current Outpatient Prescriptions on File Prior to Visit  Medication Sig Dispense Refill  . aspirin EC 81 MG tablet Take4 times a week      . atenolol (TENORMIN) 25 MG tablet TAKE 1 TABLET BY MOUTH TWICE DAILY  180 tablet  3  . cyclobenzaprine (FLEXERIL) 5 MG tablet Take 1 tablet (5 mg total) by mouth 3 (three) times daily as needed for muscle spasms. As needed  (muscle spams)  90 tablet  1  . HYDROcodone-acetaminophen (NORCO) 10-325 MG per tablet Take 1 tablet by mouth 2 (two) times daily as needed for pain.  60 tablet  1  . hydrOXYzine (ATARAX/VISTARIL) 10 MG tablet Take 1 tablet (10 mg total) by mouth 3 (three) times daily as needed for itching.  30 tablet  0  . metFORMIN (GLUCOPHAGE-XR) 500 MG 24 hr  tablet Take 4 tabs by mouth in the AM  360 tablet  3  . nitroGLYCERIN (NITROSTAT) 0.4 MG SL tablet Place 1 tablet (0.4 mg total) under the tongue every 5 (five) minutes as needed.  25 tablet  2  . oxyCODONE (OXY IR/ROXICODONE) 5 MG immediate release tablet 1-2 tabs every 4 hours as needed for pain, with limit 8 tabs per day  60 tablet  0  . SYNTHROID 150 MCG tablet TAKE 1 TABLET BY MOUTH EVERY DAY  90 tablet  3  . traMADol (ULTRAM) 50 MG tablet Take 1 tablet (50 mg total) by mouth every 6 (six) hours as needed for pain.  60 tablet  1  . cetirizine (ZYRTEC) 10 MG tablet Take 1 tablet (10 mg total) by mouth daily.  90 tablet  3  . rosuvastatin (CRESTOR) 20 MG tablet Take 1 tablet (20 mg total) by mouth daily.  90 tablet  3   No  current facility-administered medications on file prior to visit.    Review of Systems  Constitutional: Negative for unexpected weight change, or unusual diaphoresis  HENT: Negative for tinnitus.   Eyes: Negative for photophobia and visual disturbance.  Respiratory: Negative for choking and stridor.   Gastrointestinal: Negative for vomiting and blood in stool.  Genitourinary: Negative for hematuria and decreased urine volume.  Musculoskeletal: Negative for acute joint swelling Skin: Negative for color change and wound.  Neurological: Negative for tremors and numbness other than noted  Psychiatric/Behavioral: Negative for decreased concentration or  hyperactivity.  '    Objective:   Physical Exam BP 92/64  Pulse 81  Temp(Src) 97 F (36.1 C) (Oral)  Ht 5\' 6"  (1.676 m)  Wt 234 lb (106.142 kg)  BMI 37.79 kg/m2  SpO2 97% VS noted, mild ill Constitutional: Pt appears well-developed and well-nourished.  HENT: Head: NCAT.  Right Ear: External ear normal.  Left Ear: External ear normal.  Bilat tm's with mild erythema.  Max sinus areas mild tender.  Pharynx with mild erythema, no exudate Eyes: Conjunctivae and EOM are normal. Pupils are equal, round, and reactive to light.  Neck: Normal range of motion. Neck supple.  Cardiovascular: Normal rate and regular rhythm.   Pulmonary/Chest: Effort normal and breath sounds normal.  Abd:  Soft, NT, non-distended, + BS Neurological: Pt is alert. Not confused , motor 5/5 Spine diffuse chronic mild to mod tender, worse lower lumbar Skin: Skin is warm. No erythema.  Psychiatric: Pt behavior is normal. Thought content normal.      Assessment & Plan:

## 2012-10-18 NOTE — Assessment & Plan Note (Signed)
Mild to mod, for antibx course,  to f/u any worsening symptoms or concerns 

## 2012-10-18 NOTE — Assessment & Plan Note (Signed)
stable overall by history and exam, recent data reviewed with pt, and pt to continue medical treatment as before,  to f/u any worsening symptoms or concerns BP Readings from Last 3 Encounters:  10/18/12 92/64  08/29/12 120/68  07/02/12 120/72

## 2012-11-09 ENCOUNTER — Other Ambulatory Visit: Payer: Self-pay | Admitting: Internal Medicine

## 2012-11-22 ENCOUNTER — Ambulatory Visit (INDEPENDENT_AMBULATORY_CARE_PROVIDER_SITE_OTHER): Payer: Medicare Other | Admitting: Internal Medicine

## 2012-11-22 ENCOUNTER — Encounter: Payer: Self-pay | Admitting: Internal Medicine

## 2012-11-22 VITALS — BP 112/68 | HR 91 | Temp 99.5°F | Ht 66.0 in | Wt 235.0 lb

## 2012-11-22 DIAGNOSIS — M545 Low back pain, unspecified: Secondary | ICD-10-CM

## 2012-11-22 DIAGNOSIS — E785 Hyperlipidemia, unspecified: Secondary | ICD-10-CM | POA: Diagnosis not present

## 2012-11-22 DIAGNOSIS — J019 Acute sinusitis, unspecified: Secondary | ICD-10-CM | POA: Diagnosis not present

## 2012-11-22 DIAGNOSIS — G8929 Other chronic pain: Secondary | ICD-10-CM | POA: Diagnosis not present

## 2012-11-22 DIAGNOSIS — N63 Unspecified lump in unspecified breast: Secondary | ICD-10-CM | POA: Insufficient documentation

## 2012-11-22 DIAGNOSIS — E119 Type 2 diabetes mellitus without complications: Secondary | ICD-10-CM

## 2012-11-22 DIAGNOSIS — E039 Hypothyroidism, unspecified: Secondary | ICD-10-CM | POA: Diagnosis not present

## 2012-11-22 MED ORDER — KETOROLAC TROMETHAMINE 30 MG/ML IJ SOLN
30.0000 mg | Freq: Once | INTRAMUSCULAR | Status: AC
  Administered 2012-11-22: 30 mg via INTRAMUSCULAR

## 2012-11-22 MED ORDER — HYDROCODONE-ACETAMINOPHEN 10-325 MG PO TABS: 1.0000 | ORAL_TABLET | Freq: Two times a day (BID) | ORAL | Status: DC | PRN

## 2012-11-22 MED ORDER — HYDROXYZINE HCL 10 MG PO TABS: 10.0000 mg | ORAL_TABLET | Freq: Three times a day (TID) | ORAL | Status: DC | PRN

## 2012-11-22 MED ORDER — SULFAMETHOXAZOLE-TRIMETHOPRIM 800-160 MG PO TABS: 1.0000 | ORAL_TABLET | Freq: Two times a day (BID) | ORAL | Status: DC

## 2012-11-22 NOTE — Assessment & Plan Note (Signed)
stable overall by history and exam, recent data reviewed with pt, and pt to continue medical treatment as before,  to f/u any worsening symptoms or concerns Lab Results  Component Value Date   LDLCALC 37 08/22/2011

## 2012-11-22 NOTE — Assessment & Plan Note (Signed)
stable overall by history and exam, recent data reviewed with pt, and pt to continue medical treatment as before,  to f/u any worsening symptoms or concerns Lab Results  Component Value Date   HGBA1C 7.2* 08/29/2012

## 2012-11-22 NOTE — Assessment & Plan Note (Signed)
stable overall by history and exam, recent data reviewed with pt, and pt to continue medical treatment as before,  to f/u any worsening symptoms or concerns Lab Results  Component Value Date   TSH 0.74 08/22/2011

## 2012-11-22 NOTE — Progress Notes (Signed)
Pre visit review using our clinic review tool, if applicable. No additional management support is needed unless otherwise documented below in the visit note. 

## 2012-11-22 NOTE — Assessment & Plan Note (Addendum)
For pain med refill, gave letter re: notice of my intention to withdraw from chronic pain management as part of my practice after Feb 17 2013, for toradol IM today  Note:  Total time for pt hx, exam, review of record with pt in the room, determination of diagnoses and plan for further eval and tx is > 40 min, with over 50% spent in coordination and counseling of patient

## 2012-11-22 NOTE — Patient Instructions (Addendum)
You had the pain shot today (toradol) which is an anti-inflammatory  Please take all new medication as prescribed - the antibiotic   Please continue all other medications as before, and refills have been done if requested - the hydrocodone and hydroxyzine  Please have the pharmacy call with any other refills you may need.  You are given the letter today explaining the transitional pain medication refill policy  Please be aware that I will no longer be able to offer monthly refills of any Schedule II or higher medication starting Feb 17, 2013  Please go to the LAB in the Basement (turn left off the elevator) for the tests to be done at your convenience (tomorrow or after)  You will be contacted by phone if any changes need to be made immediately.  Otherwise, you will receive a letter about your results with an explanation, but please check with MyChart first.  Please remember to sign up for My Chart if you have not done so, as this will be important to you in the future with finding out test results, communicating by private email, and scheduling acute appointments online when needed.  Please return in 6 months, or sooner if needed

## 2012-11-22 NOTE — Progress Notes (Signed)
Subjective:    Patient ID: [name], female    DOB: 1945-09-02, 67 y.o.   MRN: [id number]  HPI   Here with 2-3 days acute onset fever, facial pain, pressure, headache, general weakness and malaise, and greenish d/c, with mild ST and cough, but pt denies chest pain, wheezing, increased sob or doe, orthopnea, PND, increased LE swelling, palpitations, dizziness or syncope. Denies hyper or hypo thyroid symptoms such as voice, skin or hair change.  Pt denies polydipsia, polyuria, or low sugar symptoms such as weakness or confusion improved with po intake.  Pt states overall good compliance with meds, trying to follow lower cholesterol, diabetic diet, wt overall stable but little exercise however.   Pt continues to have recurring LBP without change in severity, bowel or bladder change, fever, wt loss,  worsening LE pain/numbness/weakness, gait change or falls.  Past Medical History  Diagnosis Date  . Coronary artery disease     PCI of the left circumflex and LAD  . Hyperlipidemia   . Hypertension   . Obesity, unspecified     MODERATE  . Thyroid disease     HYPOTHYROIDISM  . Diabetes mellitus     TYPE II  . Anxiety state, unspecified   . Depressive disorder, not elsewhere classified   . Unspecified menopausal and postmenopausal disorder   . Simple or unspecified chronic serous otitis media   . Degeneration of lumbar or lumbosacral intervertebral disc   . Allergic rhinitis, cause unspecified   . Hx of hysterectomy   . Lower GI bleed 07/23/2010  . Chronic LBP    Past Surgical History  Procedure Laterality Date  . Total abdominal hysterectomy    . Lumbar fusion  01/2007    DR. Noel Gerold...3-LEVEL WITH FIXATION  . Parathyroidectomy    . Thyroidectomy    . Cardiac catheterization      PCI OF BOTH THE CIRCUMFLEX AND LEFT ANTERIOR DESCENDING ARTERY  . Cesarean section    . Heart stent  04-2010    X2  . Spine surgery      reports that she quit smoking about 26 years ago. Her smoking use  included Cigarettes. She smoked 0.00 packs per day for 30 years. She has never used smokeless tobacco. She reports that she does not drink alcohol or use illicit drugs. family history includes Arthritis in her maternal aunt; Cancer in her maternal aunt; Diabetes in her brother and maternal aunt; Heart attack (age of onset: 55) in her mother; Heart attack (age of onset: 28) in her father; Heart disease in her father and mother. There is no history of Colon cancer. Allergies  Allergen Reactions  . Doxycycline     REACTION: gi upset  . Miconazole Nitrate     REACTION: hives  . Prilosec [Omeprazole] Other (See Comments)    Chest pain  . Augmentin [Amoxicillin-Pot Clavulanate] Hives, Itching and Rash   Current Outpatient Prescriptions on File Prior to Visit  Medication Sig Dispense Refill  . aspirin EC 81 MG tablet Take4 times a week      . atenolol (TENORMIN) 25 MG tablet TAKE 1 TABLET BY MOUTH TWICE DAILY  180 tablet  3  . cyclobenzaprine (FLEXERIL) 5 MG tablet Take 1 tablet (5 mg total) by mouth 3 (three) times daily as needed for muscle spasms. As needed  (muscle spams)  90 tablet  1  . levothyroxine (SYNTHROID, LEVOTHROID) 150 MCG tablet TAKE ONE TABLET BY MOUTH DAILY  90 tablet  3  . metFORMIN (GLUCOPHAGE-XR)  500 MG 24 hr tablet Take 4 tabs by mouth in the AM  360 tablet  3  . nitroGLYCERIN (NITROSTAT) 0.4 MG SL tablet Place 1 tablet (0.4 mg total) under the tongue every 5 (five) minutes as needed.  25 tablet  2  . oxyCODONE (OXY IR/ROXICODONE) 5 MG immediate release tablet 1-2 tabs every 4 hours as needed for pain, with limit 8 tabs per day  60 tablet  0  . traMADol (ULTRAM) 50 MG tablet Take 1 tablet (50 mg total) by mouth every 6 (six) hours as needed for pain.  60 tablet  1  . cetirizine (ZYRTEC) 10 MG tablet Take 1 tablet (10 mg total) by mouth daily.  90 tablet  3  . rosuvastatin (CRESTOR) 20 MG tablet Take 1 tablet (20 mg total) by mouth daily.  90 tablet  3   No current  facility-administered medications on file prior to visit.   Review of Systems  Constitutional: Negative for unexpected weight change, or unusual diaphoresis  HENT: Negative for tinnitus.   Eyes: Negative for photophobia and visual disturbance.  Respiratory: Negative for choking and stridor.   Gastrointestinal: Negative for vomiting and blood in stool.  Genitourinary: Negative for hematuria and decreased urine volume.  Musculoskeletal: Negative for acute joint swelling Skin: Negative for color change and wound.  Neurological: Negative for tremors and numbness other than noted  Psychiatric/Behavioral: Negative for decreased concentration or  hyperactivity.       Objective:   Physical Exam BP 112/68  Pulse 91  Temp(Src) 99.5 F (37.5 C) (Oral)  Ht 5\' 6"  (1.676 m)  Wt 235 lb (106.595 kg)  BMI 37.95 kg/m2  SpO2 98% VS noted, mild ill Constitutional: Pt appears well-developed and well-nourished.  HENT: Head: NCAT.  Right Ear: External ear normal.  Left Ear: External ear normal.  Bilat tm's with mild erythema.  Max sinus areas mild tender.  Pharynx with mild erythema, no exudate Eyes: Conjunctivae and EOM are normal. Pupils are equal, round, and reactive to light.  Neck: Normal range of motion. Neck supple.  Cardiovascular: Normal rate and regular rhythm.   Pulmonary/Chest: Effort normal and breath sounds normal.  Neurological: Pt is alert. Not confused  Skin: Skin is warm. No erythema.  Psychiatric: Pt behavior is normal. Thought content normal.         Assessment & Plan:

## 2013-01-08 ENCOUNTER — Other Ambulatory Visit (INDEPENDENT_AMBULATORY_CARE_PROVIDER_SITE_OTHER): Payer: Medicare Other

## 2013-01-08 DIAGNOSIS — E119 Type 2 diabetes mellitus without complications: Secondary | ICD-10-CM

## 2013-01-08 DIAGNOSIS — E785 Hyperlipidemia, unspecified: Secondary | ICD-10-CM | POA: Diagnosis not present

## 2013-01-08 LAB — URINALYSIS, ROUTINE W REFLEX MICROSCOPIC
Leukocytes, UA: NEGATIVE
Nitrite: NEGATIVE
RBC / HPF: NONE SEEN (ref 0–?)
Specific Gravity, Urine: 1.01 (ref 1.000–1.030)
pH: 6 (ref 5.0–8.0)

## 2013-01-08 LAB — BASIC METABOLIC PANEL
BUN: 8 mg/dL (ref 6–23)
Creatinine, Ser: 0.9 mg/dL (ref 0.4–1.2)
GFR: 82.33 mL/min (ref >60.00)
Glucose, Bld: 138 mg/dL — ABNORMAL HIGH (ref 70–99)
Potassium: 3.6 mEq/L (ref 3.5–5.1)

## 2013-01-08 LAB — HEPATIC FUNCTION PANEL
ALT: 32 U/L (ref 0–35)
AST: 32 U/L (ref 0–37)
Albumin: 4 g/dL (ref 3.5–5.2)

## 2013-01-08 LAB — CBC WITH DIFFERENTIAL/PLATELET
Eosinophils Relative: 1.6 % (ref 0.0–5.0)
HCT: 44.8 % (ref 36.0–46.0)
Monocytes Relative: 4.8 % (ref 3.0–12.0)
Neutrophils Relative %: 69.6 % (ref 43.0–77.0)
Platelets: 286 10*3/uL (ref 150.0–400.0)
RBC: 5.22 Mil/uL — ABNORMAL HIGH (ref 3.87–5.11)
WBC: 11.7 10*3/uL — ABNORMAL HIGH (ref 4.5–10.5)

## 2013-01-08 LAB — LIPID PANEL
Cholesterol: 168 mg/dL (ref 0–200)
HDL: 29.6 mg/dL — ABNORMAL LOW (ref >39.00)
Triglycerides: 146 mg/dL (ref 0.0–149.0)
VLDL: 29.2 mg/dL (ref 0.0–40.0)

## 2013-01-08 LAB — TSH: TSH: 3.37 u[IU]/mL (ref 0.35–5.50)

## 2013-01-11 LAB — HEMOGLOBIN A1C: Hgb A1c MFr Bld: 7.1 % — ABNORMAL HIGH (ref 4.6–6.5)

## 2013-01-21 ENCOUNTER — Telehealth: Payer: Self-pay | Admitting: Internal Medicine

## 2013-01-21 NOTE — Telephone Encounter (Signed)
Pt called request a call back concern about lab result. Please advise. Can not get into my chart to see the result.

## 2013-01-22 NOTE — Telephone Encounter (Signed)
Left message on VM for pt to return call.

## 2013-02-21 ENCOUNTER — Encounter: Payer: Self-pay | Admitting: Internal Medicine

## 2013-02-21 ENCOUNTER — Ambulatory Visit (INDEPENDENT_AMBULATORY_CARE_PROVIDER_SITE_OTHER): Payer: Medicare Other | Admitting: Internal Medicine

## 2013-02-21 VITALS — BP 120/70 | HR 74 | Temp 98.1°F | Wt 232.2 lb

## 2013-02-21 DIAGNOSIS — E119 Type 2 diabetes mellitus without complications: Secondary | ICD-10-CM

## 2013-02-21 DIAGNOSIS — I10 Essential (primary) hypertension: Secondary | ICD-10-CM | POA: Diagnosis not present

## 2013-02-21 DIAGNOSIS — M545 Low back pain, unspecified: Secondary | ICD-10-CM

## 2013-02-21 DIAGNOSIS — Z0000 Encounter for general adult medical examination without abnormal findings: Secondary | ICD-10-CM

## 2013-02-21 DIAGNOSIS — H6690 Otitis media, unspecified, unspecified ear: Secondary | ICD-10-CM | POA: Diagnosis not present

## 2013-02-21 DIAGNOSIS — Z23 Encounter for immunization: Secondary | ICD-10-CM

## 2013-02-21 DIAGNOSIS — E785 Hyperlipidemia, unspecified: Secondary | ICD-10-CM

## 2013-02-21 DIAGNOSIS — G8929 Other chronic pain: Secondary | ICD-10-CM | POA: Diagnosis not present

## 2013-02-21 DIAGNOSIS — Z79899 Other long term (current) drug therapy: Secondary | ICD-10-CM | POA: Diagnosis not present

## 2013-02-21 DIAGNOSIS — H6691 Otitis media, unspecified, right ear: Secondary | ICD-10-CM

## 2013-02-21 MED ORDER — KETOROLAC TROMETHAMINE 30 MG/ML IJ SOLN
30.0000 mg | Freq: Once | INTRAMUSCULAR | Status: AC
  Administered 2013-02-21: 30 mg via INTRAMUSCULAR

## 2013-02-21 MED ORDER — LEVOTHYROXINE SODIUM 150 MCG PO TABS: 150.0000 ug | ORAL_TABLET | Freq: Every day | ORAL | Status: DC

## 2013-02-21 MED ORDER — OXYCODONE HCL 5 MG PO TABS: ORAL_TABLET | ORAL | Status: DC

## 2013-02-21 MED ORDER — ATENOLOL 25 MG PO TABS: 25.0000 mg | ORAL_TABLET | Freq: Two times a day (BID) | ORAL | Status: DC

## 2013-02-21 MED ORDER — METFORMIN HCL ER 500 MG PO TB24: ORAL_TABLET | ORAL | Status: DC

## 2013-02-21 MED ORDER — AZITHROMYCIN 250 MG PO TABS
ORAL_TABLET | ORAL | Status: DC
Start: 2013-02-21 — End: 2013-03-28

## 2013-02-21 NOTE — Assessment & Plan Note (Signed)
stable overall by history and exam, recent data reviewed with pt, and pt to continue medical treatment as before,  to f/u any worsening symptoms or concerns Lab Results  Component Value Date   HGBA1C 7.1* 01/08/2013

## 2013-02-21 NOTE — Progress Notes (Signed)
Pre-visit discussion using our clinic review tool. No additional management support is needed unless otherwise documented below in the visit note.  

## 2013-02-21 NOTE — Patient Instructions (Addendum)
Please take all new medication as prescribed- the antibiotic  You had the tetanus (Td), the new Prevnar pneumonia shot, and the toradol shot for pain  Please continue all other medications as before, and refills have been done if requested - the oxycodone  Please be aware that I will no longer be able to offer monthly refills of any Schedule II or higher medication after today  You will be contacted regarding the referral for: pain management  Please have the pharmacy call with any other refills you may need.  Please return in 6 months, or sooner if needed

## 2013-02-21 NOTE — Assessment & Plan Note (Signed)
stable overall by history and exam, recent data reviewed with pt, and pt to continue medical treatment as before,  to f/u any worsening symptoms or concerns Lab Results  Component Value Date   LDLCALC 109* 01/08/2013

## 2013-02-21 NOTE — Assessment & Plan Note (Signed)
stable overall by history and exam, recent data reviewed with pt, and pt to continue medical treatment as before,  to f/u any worsening symptoms or concerns BP Readings from Last 3 Encounters:  02/21/13 120/70  11/22/12 112/68  10/18/12 92/64

## 2013-02-21 NOTE — Assessment & Plan Note (Signed)
For pain med refill, and pain clinic referral

## 2013-02-21 NOTE — Progress Notes (Signed)
Subjective:    Patient ID: [name], female    DOB: Jun 03, 1945, 68 y.o.   MRN: [id number]  HPI  Here with 2 days onset severe right earache, feverish (though afeb here) radiation of pain below the ear to the lateral neck, assoc with some ST, HA, general weakness and malaise, but Pt denies chest pain, increased sob or doe, wheezing, orthopnea, PND, increased LE swelling, palpitations, dizziness or syncope.  Pt denies new neurological symptoms such as new headache, or facial or extremity weakness or numbness  Pt continues to have recurring LBP without change in severity, bowel or bladder change, fever, wt loss,  worsening LE pain/numbness/weakness, gait change or falls - does need pain med refills, and referral pain clinic.    Pt denies polydipsia, polyuria, o.  Pt states overall good compliance with meds, trying to follow lower cholesterol, diabetic diet, wt overall stable but little exercise however.     Asks for repeat toradol that has helped with pain in the past Past Medical History  Diagnosis Date  . Coronary artery disease     PCI of the left circumflex and LAD  . Hyperlipidemia   . Hypertension   . Obesity, unspecified     MODERATE  . Thyroid disease     HYPOTHYROIDISM  . Diabetes mellitus     TYPE II  . Anxiety state, unspecified   . Depressive disorder, not elsewhere classified   . Unspecified menopausal and postmenopausal disorder   . Simple or unspecified chronic serous otitis media   . Degeneration of lumbar or lumbosacral intervertebral disc   . Allergic rhinitis, cause unspecified   . Hx of hysterectomy   . Lower GI bleed 07/23/2010  . Chronic LBP    Past Surgical History  Procedure Laterality Date  . Total abdominal hysterectomy    . Lumbar fusion  01/2007    DR. Patrice Paradise...3-LEVEL WITH FIXATION  . Parathyroidectomy    . Thyroidectomy    . Cardiac catheterization      PCI OF BOTH THE CIRCUMFLEX AND LEFT ANTERIOR DESCENDING ARTERY  . Cesarean section    . Heart  stent  04-2010    X2  . Spine surgery      reports that she quit smoking about 26 years ago. Her smoking use included Cigarettes. She smoked 0.00 packs per day for 30 years. She has never used smokeless tobacco. She reports that she does not drink alcohol or use illicit drugs. family history includes Arthritis in her maternal aunt; Cancer in her maternal aunt; Diabetes in her brother and maternal aunt; Heart attack (age of onset: 59) in her mother; Heart attack (age of onset: 38) in her father; Heart disease in her father and mother. There is no history of Colon cancer. Allergies  Allergen Reactions  . Doxycycline     REACTION: gi upset  . Miconazole Nitrate     REACTION: hives  . Prilosec [Omeprazole] Other (See Comments)    Chest pain  . Augmentin [Amoxicillin-Pot Clavulanate] Hives, Itching and Rash   Current Outpatient Prescriptions on File Prior to Visit  Medication Sig Dispense Refill  . aspirin EC 81 MG tablet Take4 times a week      . cyclobenzaprine (FLEXERIL) 5 MG tablet Take 1 tablet (5 mg total) by mouth 3 (three) times daily as needed for muscle spasms. As needed  (muscle spams)  90 tablet  1  . HYDROcodone-acetaminophen (NORCO) 10-325 MG per tablet Take 1 tablet by mouth 2 (two) times  daily as needed. To fill Jan 21, 2013  60 tablet  0  . hydrOXYzine (ATARAX/VISTARIL) 10 MG tablet Take 1 tablet (10 mg total) by mouth 3 (three) times daily as needed for itching.  90 tablet  1  . nitroGLYCERIN (NITROSTAT) 0.4 MG SL tablet Place 1 tablet (0.4 mg total) under the tongue every 5 (five) minutes as needed.  25 tablet  2  . oxyCODONE (OXY IR/ROXICODONE) 5 MG immediate release tablet 1-2 tabs every 4 hours as needed for pain, with limit 8 tabs per day  60 tablet  0  . traMADol (ULTRAM) 50 MG tablet Take 1 tablet (50 mg total) by mouth every 6 (six) hours as needed for pain.  60 tablet  1  . cetirizine (ZYRTEC) 10 MG tablet Take 1 tablet (10 mg total) by mouth daily.  90 tablet  3  .  rosuvastatin (CRESTOR) 20 MG tablet Take 1 tablet (20 mg total) by mouth daily.  90 tablet  3   No current facility-administered medications on file prior to visit.    Review of Systems  Constitutional: Negative for unexpected weight change, or unusual diaphoresis  HENT: Negative for tinnitus.   Eyes: Negative for photophobia and visual disturbance.  Respiratory: Negative for choking and stridor.   Gastrointestinal: Negative for vomiting and blood in stool.  Genitourinary: Negative for hematuria and decreased urine volume.  Musculoskeletal: Negative for acute joint swelling Skin: Negative for color change and wound.  Neurological: Negative for tremors and numbness other than noted  Psychiatric/Behavioral: Negative for decreased concentration or  hyperactivity.       Objective:   Physical Exam BP 120/70  Pulse 74  Temp(Src) 98.1 F (36.7 C) (Oral)  Wt 232 lb 4 oz (105.348 kg)  SpO2 97% VS noted,  Constitutional: Pt appears well-developed and well-nourished.  HENT: Head: NCAT.  Right Ear: External ear normal.  Left Ear: External ear normal.  Right TM severe erythema, bulging Left TM ok Eyes: Conjunctivae and EOM are normal. Pupils are equal, round, and reactive to light.  Neck: Normal range of motion. Neck supple.  Cardiovascular: Normal rate and regular rhythm.   Pulmonary/Chest: Effort normal and breath sounds normal.  Spine: diffuse tender spinal and paravertebral Neurological: Pt is alert. Not confused  Skin: Skin is warm. No erythema.  Psychiatric: Pt behavior is normal. Thought content normal.     Assessment & Plan:

## 2013-02-21 NOTE — Assessment & Plan Note (Signed)
Not charged, but due for prevnar and Td today

## 2013-02-21 NOTE — Addendum Note (Signed)
Addended by: Sharon Seller B on: 02/21/2013 03:26 PM   Modules accepted: Orders

## 2013-02-21 NOTE — Assessment & Plan Note (Signed)
Mild to mod, for antibx course,  to f/u any worsening symptoms or concerns 

## 2013-02-22 ENCOUNTER — Telehealth: Payer: Self-pay

## 2013-02-22 NOTE — Telephone Encounter (Signed)
Relevant patient education assigned to patient using Emmi. ° °

## 2013-02-26 ENCOUNTER — Other Ambulatory Visit: Payer: Self-pay | Admitting: Vascular Surgery

## 2013-02-26 DIAGNOSIS — I714 Abdominal aortic aneurysm, without rupture, unspecified: Secondary | ICD-10-CM

## 2013-03-11 DIAGNOSIS — Z1231 Encounter for screening mammogram for malignant neoplasm of breast: Secondary | ICD-10-CM | POA: Diagnosis not present

## 2013-03-11 DIAGNOSIS — E7800 Pure hypercholesterolemia, unspecified: Secondary | ICD-10-CM | POA: Diagnosis not present

## 2013-03-11 DIAGNOSIS — E039 Hypothyroidism, unspecified: Secondary | ICD-10-CM | POA: Diagnosis not present

## 2013-03-11 DIAGNOSIS — N951 Menopausal and female climacteric states: Secondary | ICD-10-CM | POA: Diagnosis not present

## 2013-03-11 DIAGNOSIS — M810 Age-related osteoporosis without current pathological fracture: Secondary | ICD-10-CM | POA: Diagnosis not present

## 2013-03-11 DIAGNOSIS — Z1212 Encounter for screening for malignant neoplasm of rectum: Secondary | ICD-10-CM | POA: Diagnosis not present

## 2013-03-11 LAB — HM MAMMOGRAPHY

## 2013-03-19 ENCOUNTER — Encounter: Payer: Self-pay | Admitting: Internal Medicine

## 2013-03-28 ENCOUNTER — Encounter: Payer: Self-pay | Admitting: Cardiovascular Disease

## 2013-03-28 ENCOUNTER — Ambulatory Visit (INDEPENDENT_AMBULATORY_CARE_PROVIDER_SITE_OTHER): Payer: Medicare Other | Admitting: Cardiovascular Disease

## 2013-03-28 VITALS — BP 100/68 | HR 81 | Ht 66.0 in | Wt 235.8 lb

## 2013-03-28 DIAGNOSIS — I2510 Atherosclerotic heart disease of native coronary artery without angina pectoris: Secondary | ICD-10-CM

## 2013-03-28 DIAGNOSIS — E785 Hyperlipidemia, unspecified: Secondary | ICD-10-CM | POA: Diagnosis not present

## 2013-03-28 NOTE — Progress Notes (Signed)
HPI:   68 year old woman presenting for followup evaluation. The patient has previously been followed by Dr. Lia Foyer. She has coronary artery disease with previous stenting of the LAD and left circumflex. Her last heart catheterization in 2012 demonstrated total occlusion of the left circumflex distal to the previously stented segment. She was treated with a bare-metal stent. She otherwise was found to have patency of the LAD and moderate stenosis in the right coronary artery with medical therapy recommended.  Last lipids 01/08/2013 showed cholesterol 168, triglycerides 146, HDL 30, and LDL 109. The patient also has a small moderate sized abdominal aortic aneurysm followed by Dr. early. On most recent evaluation it was 4.2 cm in maximal dimension.  The patient had an episode of chest pain about 2 months ago where she had to take nitroglycerin. Since that time she's done quite well. She's had no further chest discomfort. She has several chronic symptoms and these include dyspnea with exertion, pain in her low back, and pain in her legs. She still smokes cigarettes, especially with stress. She is not smoking every day.  Outpatient Encounter Prescriptions as of 03/28/2013  Medication Sig  . aspirin EC 81 MG tablet Take4 times a week  . atenolol (TENORMIN) 25 MG tablet Take 1 tablet (25 mg total) by mouth 2 (two) times daily.  . cetirizine (ZYRTEC) 10 MG tablet Take 1 tablet (10 mg total) by mouth daily.  . cyclobenzaprine (FLEXERIL) 5 MG tablet Take 1 tablet (5 mg total) by mouth 3 (three) times daily as needed for muscle spasms. As needed  (muscle spams)  . hydrOXYzine (ATARAX/VISTARIL) 10 MG tablet Take 1 tablet (10 mg total) by mouth 3 (three) times daily as needed for itching.  . levothyroxine (SYNTHROID, LEVOTHROID) 150 MCG tablet Take 1 tablet (150 mcg total) by mouth daily.  . metFORMIN (GLUCOPHAGE-XR) 500 MG 24 hr tablet Take 4 tabs by mouth in the AM  . nitroGLYCERIN (NITROSTAT) 0.4 MG SL  tablet Place 1 tablet (0.4 mg total) under the tongue every 5 (five) minutes as needed.  [name] oxyCODONE (OXY IR/ROXICODONE) 5 MG immediate release tablet 1-2 tabs every 6 hours as needed for pain, with limit 4 tabs per day - to fill Apr 19, 2013  . rosuvastatin (CRESTOR) 20 MG tablet Take 1 tablet (20 mg total) by mouth daily.  . traMADol (ULTRAM) 50 MG tablet Take 1 tablet (50 mg total) by mouth every 6 (six) hours as needed for pain.  . [DISCONTINUED] azithromycin (ZITHROMAX Z-PAK) 250 MG tablet Use as directed    Allergies  Allergen Reactions  . Doxycycline     REACTION: gi upset  . Miconazole Nitrate     REACTION: hives  . Prilosec [Omeprazole] Other (See Comments)    Chest pain  . Augmentin [Amoxicillin-Pot Clavulanate] Hives, Itching and Rash    Past Medical History  Diagnosis Date  . Coronary artery disease     PCI of the left circumflex and LAD  . Hyperlipidemia   . Hypertension   . Obesity, unspecified     MODERATE  . Thyroid disease     HYPOTHYROIDISM  . Diabetes mellitus     TYPE II  . Anxiety state, unspecified   . Depressive disorder, not elsewhere classified   . Unspecified menopausal and postmenopausal disorder   . Simple or unspecified chronic serous otitis media   . Degeneration of lumbar or lumbosacral intervertebral disc   . Allergic rhinitis, cause unspecified   . Hx of hysterectomy   .  Lower GI bleed 07/23/2010  . Chronic LBP     ROS: Negative except as per HPI  BP 100/68  Pulse 81  Ht 5\' 6"  (1.676 m)  Wt 106.958 kg (235 lb 12.8 oz)  BMI 38.08 kg/m2  PHYSICAL EXAM: Pt is alert and oriented, pleasant overweight woman in NAD HEENT: normal Neck: JVP - normal, carotids 2+= without bruits Lungs: CTA bilaterally CV: RRR without murmur or gallop Abd: soft, NT, Positive BS, no hepatomegaly Ext: no C/C/E, distal pulses intact and equal Skin: warm/dry no rash  EKG:  Sinus rhythm 81 beats per minute with left axis deviation, incomplete right bundle  branch block, and poor R-wave progression. Pulmonary disease pattern noted.  ASSESSMENT AND PLAN: 1. Coronary artery disease, native vessel. No consistent symptoms of angina. She will be continued on her current medical program which includes aspirin, beta blocker, and a statin drug. She can only tolerate aspirin about 4 days per week.  2. Hyperlipidemia. The patient takes Crestor and we're going to give her some samples today. Lipids from December 2014 showed a cholesterol of 168, gross rectal 146, HDL 30, and LDL 109. We discussed the importance of lifestyle modification. She has some barriers to medication because of cost.  3. Hypertension. Blood pressure is well controlled on atenolol.  4. Abdominal aortic aneurysm. Followed by Dr. early as above. She sees him again in June with an abdominal duplex.  5. Tobacco abuse. Complete cessation advised and counseling done.  For followup I will see her back in one year unless problems arise in the interim. She understands to call if she develops recurrent chest pain or exertional symptoms.  Sherren Mocha 03/28/2013 11:14 AM

## 2013-03-28 NOTE — Patient Instructions (Signed)
Your physician recommends that you continue on your current medications as directed. Please refer to the Current Medication list given to you today.  Your physician wants you to follow-up in: 1 year with Dr. Cooper.  You will receive a reminder letter in the mail two months in advance. If you don't receive a letter, please call our office to schedule the follow-up appointment.   

## 2013-04-25 ENCOUNTER — Encounter: Payer: Self-pay | Admitting: Cardiology

## 2013-06-06 ENCOUNTER — Telehealth: Payer: Self-pay | Admitting: Cardiovascular Disease

## 2013-06-06 ENCOUNTER — Encounter (HOSPITAL_COMMUNITY): Payer: Self-pay | Admitting: Emergency Medicine

## 2013-06-06 ENCOUNTER — Emergency Department (HOSPITAL_COMMUNITY): Payer: Medicare Other

## 2013-06-06 ENCOUNTER — Inpatient Hospital Stay (HOSPITAL_COMMUNITY)
Admission: EM | Admit: 2013-06-06 | Discharge: 2013-06-08 | DRG: 247 | Disposition: A | Discharge status: Home / Self Care | Payer: Medicare Other | Attending: Cardiovascular Disease | Admitting: Cardiovascular Disease

## 2013-06-06 DIAGNOSIS — Z833 Family history of diabetes mellitus: Secondary | ICD-10-CM | POA: Diagnosis not present

## 2013-06-06 DIAGNOSIS — Z7982 Long term (current) use of aspirin: Secondary | ICD-10-CM

## 2013-06-06 DIAGNOSIS — I200 Unstable angina: Secondary | ICD-10-CM | POA: Diagnosis present

## 2013-06-06 DIAGNOSIS — E669 Obesity, unspecified: Secondary | ICD-10-CM | POA: Diagnosis present

## 2013-06-06 DIAGNOSIS — Z981 Arthrodesis status: Secondary | ICD-10-CM | POA: Diagnosis not present

## 2013-06-06 DIAGNOSIS — Z6837 Body mass index (BMI) 37.0-37.9, adult: Secondary | ICD-10-CM

## 2013-06-06 DIAGNOSIS — F411 Generalized anxiety disorder: Secondary | ICD-10-CM | POA: Diagnosis present

## 2013-06-06 DIAGNOSIS — G8929 Other chronic pain: Secondary | ICD-10-CM | POA: Diagnosis present

## 2013-06-06 DIAGNOSIS — R079 Chest pain, unspecified: Secondary | ICD-10-CM | POA: Diagnosis not present

## 2013-06-06 DIAGNOSIS — I10 Essential (primary) hypertension: Secondary | ICD-10-CM | POA: Diagnosis present

## 2013-06-06 DIAGNOSIS — Z9861 Coronary angioplasty status: Secondary | ICD-10-CM | POA: Diagnosis not present

## 2013-06-06 DIAGNOSIS — I2510 Atherosclerotic heart disease of native coronary artery without angina pectoris: Secondary | ICD-10-CM | POA: Diagnosis not present

## 2013-06-06 DIAGNOSIS — F329 Major depressive disorder, single episode, unspecified: Secondary | ICD-10-CM | POA: Diagnosis present

## 2013-06-06 DIAGNOSIS — M545 Low back pain, unspecified: Secondary | ICD-10-CM | POA: Diagnosis present

## 2013-06-06 DIAGNOSIS — F3289 Other specified depressive episodes: Secondary | ICD-10-CM | POA: Diagnosis present

## 2013-06-06 DIAGNOSIS — E119 Type 2 diabetes mellitus without complications: Secondary | ICD-10-CM | POA: Diagnosis present

## 2013-06-06 DIAGNOSIS — I714 Abdominal aortic aneurysm, without rupture, unspecified: Secondary | ICD-10-CM | POA: Diagnosis present

## 2013-06-06 DIAGNOSIS — E039 Hypothyroidism, unspecified: Secondary | ICD-10-CM | POA: Diagnosis present

## 2013-06-06 DIAGNOSIS — Z87891 Personal history of nicotine dependence: Secondary | ICD-10-CM | POA: Diagnosis not present

## 2013-06-06 DIAGNOSIS — E785 Hyperlipidemia, unspecified: Secondary | ICD-10-CM | POA: Diagnosis not present

## 2013-06-06 DIAGNOSIS — E782 Mixed hyperlipidemia: Secondary | ICD-10-CM | POA: Diagnosis present

## 2013-06-06 DIAGNOSIS — R0789 Other chest pain: Secondary | ICD-10-CM | POA: Diagnosis not present

## 2013-06-06 DIAGNOSIS — I252 Old myocardial infarction: Secondary | ICD-10-CM | POA: Diagnosis not present

## 2013-06-06 DIAGNOSIS — Z8249 Family history of ischemic heart disease and other diseases of the circulatory system: Secondary | ICD-10-CM | POA: Diagnosis not present

## 2013-06-06 HISTORY — DX: Abdominal aortic aneurysm, without rupture, unspecified: I71.40

## 2013-06-06 HISTORY — DX: Hypothyroidism, unspecified: E03.9

## 2013-06-06 HISTORY — DX: Abdominal aortic aneurysm, without rupture: I71.4

## 2013-06-06 LAB — CBC
HCT: 43.6 % (ref 36.0–46.0)
HCT: 41.2 % (ref 36.0–46.0)
HEMOGLOBIN: 14.4 g/dL (ref 12.0–15.0)
Hemoglobin: 13.5 g/dL (ref 12.0–15.0)
MCH: 28.9 pg (ref 26.0–34.0)
MCH: 28.8 pg (ref 26.0–34.0)
MCHC: 33 g/dL (ref 30.0–36.0)
MCHC: 32.8 g/dL (ref 30.0–36.0)
MCV: 87.6 fL (ref 78.0–100.0)
MCV: 88 fL (ref 78.0–100.0)
Platelets: 273 10*3/uL (ref 150–400)
Platelets: 255 10*3/uL (ref 150–400)
RBC: 4.98 MIL/uL (ref 3.87–5.11)
RBC: 4.68 MIL/uL (ref 3.87–5.11)
RDW: 13.8 % (ref 11.5–15.5)
RDW: 13.6 % (ref 11.5–15.5)
WBC: 9.5 10*3/uL (ref 4.0–10.5)
WBC: 7.3 10*3/uL (ref 4.0–10.5)

## 2013-06-06 LAB — BASIC METABOLIC PANEL WITH GFR
BUN: 10 mg/dL (ref 6–23)
CO2: 22 meq/L (ref 19–32)
Calcium: 8.9 mg/dL (ref 8.4–10.5)
Chloride: 106 meq/L (ref 96–112)
Creatinine, Ser: 0.78 mg/dL (ref 0.50–1.10)
GFR calc Af Amer: >90 mL/min
GFR calc non Af Amer: 85 mL/min — ABNORMAL LOW
Glucose, Bld: 108 mg/dL — ABNORMAL HIGH (ref 70–99)
Potassium: 3.9 meq/L (ref 3.7–5.3)
Sodium: 142 meq/L (ref 137–147)

## 2013-06-06 LAB — I-STAT TROPONIN, ED
TROPONIN I, POC: 0 ng/mL (ref 0.00–0.08)
Troponin i, poc: 0 ng/mL (ref 0.00–0.08)

## 2013-06-06 LAB — PROTIME-INR
INR: 1.05 (ref 0.00–1.49)
PROTHROMBIN TIME: 13.5 s (ref 11.6–15.2)

## 2013-06-06 LAB — GLUCOSE, CAPILLARY: Glucose-Capillary: 149 mg/dL — ABNORMAL HIGH (ref 70–99)

## 2013-06-06 LAB — T4, FREE: Free T4: 1.21 ng/dL (ref 0.80–1.80)

## 2013-06-06 LAB — CREATININE, SERUM
Creatinine, Ser: 0.86 mg/dL (ref 0.50–1.10)
GFR calc Af Amer: 79 mL/min — ABNORMAL LOW (ref >90)
GFR calc non Af Amer: 68 mL/min — ABNORMAL LOW (ref >90)

## 2013-06-06 LAB — TROPONIN I

## 2013-06-06 LAB — PRO B NATRIURETIC PEPTIDE: Pro B Natriuretic peptide (BNP): 39.9 pg/mL (ref 0–125)

## 2013-06-06 LAB — CBG MONITORING, ED: Glucose-Capillary: 108 mg/dL — ABNORMAL HIGH (ref 70–99)

## 2013-06-06 LAB — TSH: TSH: 1.92 u[IU]/mL (ref 0.350–4.500)

## 2013-06-06 MED ORDER — HYDROXYZINE HCL 10 MG PO TABS
10.0000 mg | ORAL_TABLET | Freq: Three times a day (TID) | ORAL | Status: DC | PRN
  Filled 2013-06-06: qty 1

## 2013-06-06 MED ORDER — ASPIRIN 300 MG RE SUPP: 300.0000 mg | RECTAL | Status: AC

## 2013-06-06 MED ORDER — HEPARIN SODIUM (PORCINE) 5000 UNIT/ML IJ SOLN
5000.0000 [IU] | Freq: Three times a day (TID) | INTRAMUSCULAR | Status: DC
  Administered 2013-06-06 – 2013-06-07 (×3): 5000 [IU] via SUBCUTANEOUS
  Filled 2013-06-06 (×6): qty 1

## 2013-06-06 MED ORDER — SODIUM CHLORIDE 0.9 % IV SOLN
INTRAVENOUS | Status: DC
  Administered 2013-06-06: 15:00:00 via INTRAVENOUS

## 2013-06-06 MED ORDER — ASPIRIN EC 81 MG PO TBEC
81.0000 mg | DELAYED_RELEASE_TABLET | Freq: Every day | ORAL | Status: DC
  Administered 2013-06-08: 10:00:00 81 mg via ORAL
  Filled 2013-06-06 (×2): qty 1

## 2013-06-06 MED ORDER — ATENOLOL 25 MG PO TABS
25.0000 mg | ORAL_TABLET | Freq: Two times a day (BID) | ORAL | Status: DC
  Administered 2013-06-07 – 2013-06-08 (×2): 25 mg via ORAL
  Filled 2013-06-06 (×6): qty 1

## 2013-06-06 MED ORDER — TRAMADOL HCL 50 MG PO TABS
50.0000 mg | ORAL_TABLET | Freq: Four times a day (QID) | ORAL | Status: DC | PRN
  Administered 2013-06-06 – 2013-06-08 (×6): 50 mg via ORAL
  Filled 2013-06-06 (×6): qty 1

## 2013-06-06 MED ORDER — NITROGLYCERIN 0.4 MG SL SUBL: 0.4000 mg | SUBLINGUAL_TABLET | SUBLINGUAL | Status: DC | PRN

## 2013-06-06 MED ORDER — MORPHINE SULFATE 4 MG/ML IJ SOLN
4.0000 mg | Freq: Once | INTRAMUSCULAR | Status: AC
  Administered 2013-06-06: 4 mg via INTRAVENOUS
  Filled 2013-06-06: qty 1

## 2013-06-06 MED ORDER — LEVOTHYROXINE SODIUM 150 MCG PO TABS: 150.0000 ug | ORAL_TABLET | Freq: Every day | ORAL | Status: DC

## 2013-06-06 MED ORDER — INSULIN ASPART 100 UNIT/ML ~~LOC~~ SOLN
0.0000 [IU] | Freq: Three times a day (TID) | SUBCUTANEOUS | Status: DC
  Administered 2013-06-07: 1 [IU] via SUBCUTANEOUS
  Administered 2013-06-07: 18:00:00 2 [IU] via SUBCUTANEOUS

## 2013-06-06 MED ORDER — LEVOTHYROXINE SODIUM 150 MCG PO TABS
150.0000 ug | ORAL_TABLET | Freq: Every day | ORAL | Status: DC
  Administered 2013-06-07 – 2013-06-08 (×2): 150 ug via ORAL
  Filled 2013-06-06 (×4): qty 1

## 2013-06-06 MED ORDER — NITROGLYCERIN 0.4 MG SL SUBL
0.4000 mg | SUBLINGUAL_TABLET | SUBLINGUAL | Status: DC | PRN
  Administered 2013-06-06: 0.4 mg via SUBLINGUAL
  Filled 2013-06-06: qty 1

## 2013-06-06 MED ORDER — ATORVASTATIN CALCIUM 40 MG PO TABS
40.0000 mg | ORAL_TABLET | Freq: Every day | ORAL | Status: DC
  Administered 2013-06-06 – 2013-06-07 (×2): 40 mg via ORAL
  Filled 2013-06-06 (×4): qty 1

## 2013-06-06 MED ORDER — ASPIRIN 81 MG PO CHEW
324.0000 mg | CHEWABLE_TABLET | ORAL | Status: AC
  Administered 2013-06-06: 324 mg via ORAL
  Filled 2013-06-06: qty 4

## 2013-06-06 MED ORDER — ASPIRIN EC 81 MG PO TBEC: 81.0000 mg | DELAYED_RELEASE_TABLET | ORAL | Status: DC

## 2013-06-06 NOTE — ED Provider Notes (Signed)
CSN: [id number]     Arrival date & time 06/06/13  1133 History   First MD Initiated Contact with Patient 06/06/13 1145     Chief Complaint  Patient presents with  . Chest Pain     (Consider location/radiation/quality/duration/timing/severity/associated sxs/prior Treatment) Patient is a 68 y.o. female presenting with chest pain. The history is provided by the patient. No language interpreter was used.  Chest Pain Pain location:  L chest Pain quality: aching and sharp   Pain radiates to:  Does not radiate Pain radiates to the back: no   Pain severity:  Moderate Associated symptoms: shortness of breath   Associated symptoms: no fever and no nausea   Associated symptoms comment:  One week ago she started having left chest pain that resolved with SL NTG x 2. During the past week she had recurrent chest pain that resolved without intervention until this morning when her pain worsened and was no better with SL NTG x 2. She reports SOB, denies nausea. She has a history of CAD, last stents placed in 2012, per chart notes. She contacted her cardiology office this morning, Dr. Burt Knack, and was advised to come to the ED for further evaluation.   Past Medical History  Diagnosis Date  . Coronary artery disease     PCI of the left circumflex and LAD  . Hyperlipidemia   . Hypertension   . Obesity, unspecified     MODERATE  . Thyroid disease     HYPOTHYROIDISM  . Diabetes mellitus     TYPE II  . Anxiety state, unspecified   . Depressive disorder, not elsewhere classified   . Unspecified menopausal and postmenopausal disorder   . Simple or unspecified chronic serous otitis media   . Degeneration of lumbar or lumbosacral intervertebral disc   . Allergic rhinitis, cause unspecified   . Hx of hysterectomy   . Lower GI bleed 07/23/2010  . Chronic LBP    Past Surgical History  Procedure Laterality Date  . Total abdominal hysterectomy    . Lumbar fusion  01/2007    DR. Patrice Paradise...3-LEVEL WITH  FIXATION  . Parathyroidectomy    . Thyroidectomy    . Cardiac catheterization      PCI OF BOTH THE CIRCUMFLEX AND LEFT ANTERIOR DESCENDING ARTERY  . Cesarean section    . Heart stent  04-2010    X2  . Spine surgery     Family History  Problem Relation Age of Onset  . Heart attack Mother 85    s/p D&C-CARDIAC ARREST 1966  . Heart disease Mother   . Heart attack Father 62    1978 WITH MI  . Heart disease Father   . Colon cancer Neg Hx   . Diabetes Brother   . Diabetes Maternal Aunt   . Arthritis Maternal Aunt   . Cancer Maternal Aunt    History  Substance Use Topics  . Smoking status: Former Smoker -- 30 years    Types: Cigarettes    Quit date: 05/31/1986  . Smokeless tobacco: Never Used  . Alcohol Use: No   OB History   Grav Para Term Preterm Abortions TAB SAB Ect Mult Living                 Review of Systems  Constitutional: Negative for fever and chills.  HENT: Negative.   Respiratory: Positive for shortness of breath.   Cardiovascular: Positive for chest pain. Negative for leg swelling.  Gastrointestinal: Negative.  Negative for nausea.  Musculoskeletal: Negative.   Skin: Negative.   Neurological: Negative.       Allergies  Doxycycline; Miconazole nitrate; Prilosec; and Augmentin  Home Medications   Prior to Admission medications   Medication Sig Start Date End Date Taking? Authorizing Provider  aspirin EC 81 MG tablet Take4 times a week 11/24/10   Hillary Bow, MD  atenolol (TENORMIN) 25 MG tablet Take 1 tablet (25 mg total) by mouth 2 (two) times daily. 02/21/13   Biagio Borg, MD  cetirizine (ZYRTEC) 10 MG tablet Take 1 tablet (10 mg total) by mouth daily. 04/21/11 03/28/13  Biagio Borg, MD  cyclobenzaprine (FLEXERIL) 5 MG tablet Take 1 tablet (5 mg total) by mouth 3 (three) times daily as needed for muscle spasms. As needed  (muscle spams) 07/02/12   Biagio Borg, MD  hydrOXYzine (ATARAX/VISTARIL) 10 MG tablet Take 1 tablet (10 mg total) by mouth 3  (three) times daily as needed for itching. 11/22/12   Biagio Borg, MD  levothyroxine (SYNTHROID, LEVOTHROID) 150 MCG tablet Take 1 tablet (150 mcg total) by mouth daily. 02/21/13   Biagio Borg, MD  metFORMIN (GLUCOPHAGE-XR) 500 MG 24 hr tablet Take 4 tabs by mouth in the AM 02/21/13   Biagio Borg, MD  nitroGLYCERIN (NITROSTAT) 0.4 MG SL tablet Place 1 tablet (0.4 mg total) under the tongue every 5 (five) minutes as needed. 11/25/11   Hillary Bow, MD  oxyCODONE (OXY IR/ROXICODONE) 5 MG immediate release tablet 1-2 tabs every 6 hours as needed for pain, with limit 4 tabs per day - to fill Apr 19, 2013 02/21/13   Biagio Borg, MD  rosuvastatin (CRESTOR) 20 MG tablet Take 1 tablet (20 mg total) by mouth daily. 04/21/11 03/28/13  Biagio Borg, MD  traMADol (ULTRAM) 50 MG tablet Take 1 tablet (50 mg total) by mouth every 6 (six) hours as needed for pain. 02/27/12   Biagio Borg, MD   BP 103/68  Pulse 72  Temp(Src) 97.7 F (36.5 C) (Oral)  Resp 18  Ht 5\' 5"  (1.651 m)  Wt 235 lb (106.595 kg)  BMI 39.11 kg/m2  SpO2 98% Physical Exam  Constitutional: She appears well-developed and well-nourished.  HENT:  Head: Normocephalic.  Neck: Normal range of motion. Neck supple.  Cardiovascular: Normal rate and regular rhythm.   Pulmonary/Chest: Effort normal and breath sounds normal.  Abdominal: Soft. Bowel sounds are normal. There is no tenderness. There is no rebound and no guarding.  Musculoskeletal: Normal range of motion.  Neurological: She is alert. No cranial nerve deficit.  Skin: Skin is warm and dry. No rash noted.  Psychiatric: She has a normal mood and affect.    ED Course  Procedures (including critical care time) Labs Review Labs Reviewed  CBC  BASIC METABOLIC PANEL  [place], ED   Results for orders placed during the hospital encounter of 06/06/13  CBC      Result Value Ref Range   WBC 9.5  4.0 - 10.5 K/uL   RBC 4.98  3.87 - 5.11 MIL/uL    Hemoglobin 14.4  12.0 - 15.0 g/dL   HCT 43.6  36.0 - 46.0 %   MCV 87.6  78.0 - 100.0 fL   MCH 28.9  26.0 - 34.0 pg   MCHC 33.0  30.0 - 36.0 g/dL   RDW 13.8  11.5 - 15.5 %   Platelets 273  150 - 400 K/uL  BASIC METABOLIC  PANEL      Result Value Ref Range   Sodium 142  137 - 147 mEq/L   Potassium 3.9  3.7 - 5.3 mEq/L   Chloride 106  96 - 112 mEq/L   CO2 22  19 - 32 mEq/L   Glucose, Bld 108 (*) 70 - 99 mg/dL   BUN 10  6 - 23 mg/dL   Creatinine, Ser 0.78  0.50 - 1.10 mg/dL   Calcium 8.9  8.4 - 10.5 mg/dL   GFR calc non Af Amer 85 (*) >90 mL/min   GFR calc Af Amer >90  >90 mL/min  PRO B NATRIURETIC PEPTIDE      Result Value Ref Range   Pro B Natriuretic peptide (BNP) 39.9  0 - 125 pg/mL  I-STAT TROPOININ, ED      Result Value Ref Range   Troponin i, poc 0.00  0.00 - 0.08 ng/mL   Comment 3            No results found.  Imaging Review No results found.   EKG Interpretation   Date/Time:  Thursday Jun 06 2013 11:36:37 EDT Ventricular Rate:  76 PR Interval:  156 QRS Duration: 92 QT Interval:  392 QTC Calculation: 441 R Axis:   -57 Text Interpretation:  Normal sinus rhythm Left axis deviation Incomplete  right bundle branch block Abnormal ECG No significant change since last  tracing Confirmed by ZACKOWSKI  MD, SCOTT ([id number]) on 06/06/2013 11:57:55 AM      MDM   Final diagnoses:  None    1. Chest pain 2. History of CAD  12:40: She is stable in ED, nonacute EKG that is unchanged from previous, troponin negative. Discussed with cardiology who will see and evaluate patient in the ED.    Dewaine Oats, PA-C 06/06/13 1258

## 2013-06-06 NOTE — Telephone Encounter (Signed)
New message     C/o pain on left side of chest.  Pt took 2 nitro 5mins ago

## 2013-06-06 NOTE — Progress Notes (Signed)
Cath postponed until tomorrow due to emergent cases.  [name] [facility]

## 2013-06-06 NOTE — ED Notes (Signed)
Lab called to add on PT-INR 

## 2013-06-06 NOTE — ED Notes (Signed)
Cath lab called- pt plan to go around 430

## 2013-06-06 NOTE — Telephone Encounter (Signed)
Spoke with patient. Pt states she had upper left sided chest last week relieved with 2 NTG. Pt states during the night last night she woke up with similar left sided chest pain. She took 2 NTG without relief. Pt states she continues to have left sided chest pain for several hours now. She describes the pain as a sharp, stabbing pain. I advised pt to call 911 for transport to [place].  Pt verbalized understanding.

## 2013-06-06 NOTE — ED Notes (Signed)
Cardiology at bedside.

## 2013-06-06 NOTE — H&P (Signed)
History and Physical  Patient ID: JENESIS AMARAL MRN: [id number], DOB: [birth year] Date of Encounter: 06/06/2013, 2:30 PM Primary Physician: [name], MD Primary Cardiologist: Burt Knack  Chief Complaint: CP Reason for Admission: CP  HPI: Ms. Rascon is a 68 y/o F with history of CAD s/p remote MI s/p PCI to RCA, prior stenting to LAD and Cx, DM, HTN, tobacco abuse, 4.7cm AAA who presented to [facility] with chest pain. This has been on and off for the past 2 weeks. The last week it has been further increasing. She had a prolonged episode today for which she took 2 SL NTG with no significant relief. NTG usually helps. This was associated with SOB but no other symptoms. No nausea, vomiting, syncope. This pain feels similar to prior angina. She contacted our office and was advised to come to the ED for further evaluation. Initial troponin negative, pBNP WNL, CXR nonacute and EKG NSR ICRBBB without acute changes. She received morphine in the ER with relief. She was given 324mg  ASA. Last cath noted below.  Past Medical History  Diagnosis Date  . Coronary artery disease     a. s/p inferior MI 1998 followed by PCI of RCA at that time. b. S/p prior PCI of the left circumflex and LAD.  [name] Hyperlipidemia   . Hypertension   . Obesity, unspecified   . Hypothyroidism   . Diabetes mellitus     TYPE II  . Anxiety state, unspecified   . Depressive disorder, not elsewhere classified   . Degeneration of lumbar or lumbosacral intervertebral disc   . Allergic rhinitis, cause unspecified   . Lower GI bleed 06/2010    Diverticular bleed  . Chronic LBP   . AAA (abdominal aortic aneurysm)     a. 4.7cm in 08/2012. Followed by [facility].     Most Recent Cardiac Studies: Cath 04/2010 DATE OF PROCEDURE: 04/30/2010  DATE OF DISCHARGE:  CARDIAC CATHETERIZATION  INDICATIONS: Ms. Raikes is a very delightful 68 year old, well known to  me. She has previously undergone percutaneous intervention of both the  left  anterior descending and circumflex coronary arteries. She has most  recently undergone catheterization in November 2011. At that time,  there was a mild plaquing beyond the previously placed circumflex stent,  modest RCA disease, and a known 70% irregular stenosis of a modest-sized  diagonal. The large LAD is without critical narrowing. Unfortunately,  the patient has stopped her Crestor because of cost and also stopped  Plavix. She had a severe episode of chest pain last week, in which she  was admitted. She underwent enzyme testing which was negative. She has  continued to have symptoms, which have accelerated in the past few days.  We had her undergo stress nuclear imaging, and this demonstrated a large  redistribution defect in the posterolateral segment. As a result, she  was admitted by Dr. Burt Knack and set up for cardiac catheterization today.  Risks, benefits, and alternatives were discussed with the patient and  she consented to proceed.  PROCEDURES:  1. Left heart catheterization.  2. Selective coronary arteriography.  3. Selective left ventriculography.  4. Percutaneous stenting of the circumflex coronary artery.  DESCRIPTION OF THE PROCEDURE: The patient was brought to the cath lab,  prepped and draped in usual fashion. After careful analysis, we elected  to use of the left femoral region, a Smart needle was used to enter with  an anterior puncture. A 5-French sheath was placed. Views of the left  and right coronary arteries were obtained. Central aortic and left  ventricular pressures were measured with pigtail. The patient does have  a distal aortic aneurysm, and the wire, stent, and coil and therefore a  Versacore wire was used to gain access. Following this, we reviewed the  films. Compared to the previous study, there is now total occlusion but  this is distal to the stent, not within the stent itself. This is in  the circumflex territory. The distal vessel fills by  retrograde  collaterals. We carefully looked at the old films. There is also some  progression in the right coronary artery from the previous study of 4  months earlier. Given the symptoms and the lack of critical LAD  disease, we elected to attempt to cross the circumflex vessel and I  discussed this with the patient. A JL-3.5 guiding catheter was  utilized, bivalirudin was given according to protocol. Prasugrel was  administered as a 60 mg oral bolus, she had no contraindications to its  use. Following this, we used a traverse wire and were able to gain  access to the distal vessel. A 2.25 and 2.5 mm balloons were used to  predilate and open this area up. Based on the previous size  demonstrated at catheterization, I elected to use a 2.75 x 18 Integrity  stent. We measured the length, and the size was felt to be compatible  with the previous intervention which had a 3.5 mm proximally and  appeared to be about 3 mm distally on the previous angiographic studies.  This stent was deployed at 14 atmospheres and postdilated with a 3.25-mm  balloon to about 6-8 atmospheres throughout. There was some tapered  narrowing distally, just prior to the bifurcation but after multiple  views and waiting for a while, we went back in, took an additional view  and this appeared to be widely patent. After nitroglycerin, there was  about 50% narrowing in the AV circumflex after the bifurcation. At this  point, we decided to stop the patient's tolerance for these procedures  is not very good. She had some chest pain throughout, also experienced  headache, restlessness. Multiple doses of Versed and fentanyl were  given during the course of the procedure. ACT was checked and was  appropriate for the procedure itself. There were no major complications  other than continued chest pain. There were no electrocardiographic  abnormalities, and the vessel was widely patent as she complained of  chest pain.    HEMODYNAMIC DATA:  1. The central aortic pressure 137/81, mean 104.  2. Left ventricular pressure 137/90.  3. No gradient or pullback across the aortic valve.  ANGIOGRAPHIC DATA:  1. Ventriculography done in the RAO projection reveals a small wall  motion abnormality in the mid inferior wall. Ejection fraction  would be estimated at around 50%.  2. The right coronary artery has some progressive disease of about 60-  80% in the proximal mid segment. The distal vessel appears to be  50-70% narrowing although may be worse after nitroglycerin was  administered.  3. The left main is free of critical disease.  4. The LAD is widely patent at the stent site with less than 30%  narrowing. There is some diffuse luminal irregularity. The  diagonal branch is somewhat diffusely diseased with about 70%  eccentric focal stenosis proximally.  5. The circumflex has about 30% narrowing. There was an AV circumflex  with about 70% narrowing supplying a distal marginal branch. The  large  marginal branch has been previously stented. The stent  itself is patent but is totally occluded at the end of the stent.  The vessel fills by retrograde collaterals from both LAD and  circumflex systems. The bifurcation comes in. After stenting, the  vessel was widely patent with good antegrade flow. The segment  just distal to the stent after giving it some time appeared to be  relatively adequate leading into the large superior branch. The  inferior branch had some narrowing as noted previously.  CONCLUSIONS:  1. Preserved overall left ventricular function with a new wall motion  abnormality involving the mid inferior segment.  2. New total occlusion of the circumflex coronary artery with a total  occlusion of the circumflex distal to the stent site with  successful reopening with a non-drug-eluting platform.  3. Other findings as noted above.  DISPOSITION: This was a difficult case. The patient is typically   restless even with good sedation. Importantly, there is new total  occlusion compared to November. The patient has stopped her Plavix as  well as her Crestor. She has smoked some although has claimed not to  too much. Risk factor adjustment will clearly be necessary. We chose a  non-drug-eluting stent because of her history of stopping Plavix in the  past without really reporting it. A lot of this is financially  mediated. I will continue to discuss the options with her, and we will  do everything we can to try to get her samples.  Loretha Brasil. Lia Foyer, MD, [facility]   2D Echo 03/06/12 Left ventricle: The cavity size was normal. Wall thickness was normal. Systolic function was normal. The estimated ejection fraction was in the range of 55% to 60%. Wall motion was normal; there were no regional wall motion abnormalities. There was an increased relative contribution of atrial contraction to ventricular filling.   Surgical History:  Past Surgical History  Procedure Laterality Date  . Total abdominal hysterectomy    . Lumbar fusion  01/2007    DR. Patrice Paradise...3-LEVEL WITH FIXATION  . Parathyroidectomy    . Thyroidectomy    . Cardiac catheterization      PCI OF BOTH THE CIRCUMFLEX AND LEFT ANTERIOR DESCENDING ARTERY  . Cesarean section    . Heart stent  04-2010    X2  . Spine surgery       Home Meds: Prior to Admission medications   Medication Sig Start Date End Date Taking? Authorizing Provider  aspirin EC 81 MG tablet Take 81 mg by mouth 4 (four) times a week. Take4 times a week tue thur sat sun 11/24/10  Yes Hillary Bow, MD  atenolol (TENORMIN) 25 MG tablet Take 1 tablet (25 mg total) by mouth 2 (two) times daily. 02/21/13  Yes Biagio Borg, MD  cetirizine (ZYRTEC) 10 MG chewable tablet Chew 10 mg by mouth daily as needed for allergies.   Yes Historical Provider, MD  hydrOXYzine (ATARAX/VISTARIL) 10 MG tablet Take 1 tablet (10 mg total) by mouth 3 (three) times daily as needed for itching.  11/22/12  Yes Biagio Borg, MD  levothyroxine (SYNTHROID, LEVOTHROID) 150 MCG tablet Take 1 tablet (150 mcg total) by mouth daily. 02/21/13  Yes Biagio Borg, MD  metFORMIN (GLUCOPHAGE-XR) 500 MG 24 hr tablet Take 2,000 mg by mouth daily with breakfast.   Yes Historical Provider, MD  nitroGLYCERIN (NITROSTAT) 0.4 MG SL tablet Place 1 tablet (0.4 mg total) under the tongue every 5 (five) minutes as needed. 11/25/11  Yes Hillary Bow, MD  oxyCODONE (OXY IR/ROXICODONE) 5 MG immediate release tablet 1-2 tabs every 6 hours as needed for pain, with limit 4 tabs per day - to fill Apr 19, 2013 02/21/13  Yes Biagio Borg, MD  traMADol (ULTRAM) 50 MG tablet Take 1 tablet (50 mg total) by mouth every 6 (six) hours as needed for pain. 02/27/12  Yes Biagio Borg, MD  rosuvastatin (CRESTOR) 20 MG tablet Take 1 tablet (20 mg total) by mouth daily. 04/21/11 03/28/13  Biagio Borg, MD    Allergies:  Allergies  Allergen Reactions  . Doxycycline     REACTION: gi upset  . Miconazole Nitrate     REACTION: hives  . Prilosec [Omeprazole] Other (See Comments)    Chest pain  . Augmentin [Amoxicillin-Pot Clavulanate] Hives, Itching and Rash    History   Social History  . Marital Status: Divorced    Spouse Name: N/A    Number of Children: 3  . Years of Education: N/A   Occupational History  . [place]MIN [employer]  .     Social History Main Topics  . Smoking status: Former Smoker -- 30 years    Types: Cigarettes    Quit date: 05/31/1986  . Smokeless tobacco: Never Used  . Alcohol Use: No  . Drug Use: No  . Sexual Activity: Not on file   Other Topics Concern  . Not on file   Social History Narrative   DIVORCED   3 CHILDREN   PATIENT SIGNED A DESIGNATED PARTY RELEASE TO ALLOW HER DAUGHTER, TRAMAINE Gebhard, TO HAVE ACCESS TO HER MEDICAL RECORDS/INFORMATION. [name], May 04, 2009 @ 3:27 PM   Smokes cigarettes on rare occasions.     Family History  Problem Relation Age of  Onset  . Heart attack Mother 33    s/p D&C-CARDIAC ARREST 1966  . Heart disease Mother   . Heart attack Father 75    1978 WITH MI  . Heart disease Father   . Colon cancer Neg Hx   . Diabetes Brother   . Diabetes Maternal Aunt   . Arthritis Maternal Aunt   . Cancer Maternal Aunt     Review of Systems: All other systems reviewed and are otherwise negative except as noted above.  Labs:   Lab Results  Component Value Date   WBC 9.5 06/06/2013   HGB 14.4 06/06/2013   HCT 43.6 06/06/2013   MCV 87.6 06/06/2013   PLT 273 06/06/2013     Recent Labs Lab 06/06/13 1155  NA 142  K 3.9  CL 106  CO2 22  BUN 10  CREATININE 0.78  CALCIUM 8.9  GLUCOSE 108*   No results found for this basename: CKTOTAL, CKMB, TROPONINI,  in the last 72 hours Lab Results  Component Value Date   CHOL 168 01/08/2013   HDL 29.60* 01/08/2013   LDLCALC 109* 01/08/2013   TRIG 146.0 01/08/2013     Radiology/Studies:  Dg Chest 2 View  06/06/2013   CLINICAL DATA:  Chest pain  EXAM: CHEST  2 VIEW  COMPARISON:  03/05/2012  FINDINGS: Cardiac shadow is within normal limits. The lungs are well aerated bilaterally. No focal infiltrate or sizable effusion is seen. No acute bony abnormality is noted.  IMPRESSION: No active cardiopulmonary disease.   Electronically Signed   By: Inez Catalina M.D.   On: 06/06/2013 13:12     EKG: NSR 76bpm, left axis  deviation, incomplete RBBB, no significant change from prior tracing  Physical Exam: Blood pressure 103/70, pulse 67, temperature 97.7 F (36.5 C), temperature source Oral, resp. rate 18, height 5\' 6"  (1.676 m), weight 230 lb (104.327 kg), SpO2 99.00%. General: Well developed, well nourished, in no acute distress. Obese Head: Normocephalic, atraumatic, sclera non-icteric, no xanthomas, nares are without discharge.  Neck: Negative for carotid bruits. JVD not seen due to obesity Lungs: Clear bilaterally to auscultation without wheezes, rales, or rhonchi. Breathing is  unlabored. Heart: RRR with S1 S2. No murmurs, rubs, or gallops appreciated. Abdomen: Soft, non-tender, non-distended with normoactive bowel sounds. No hepatomegaly. No rebound/guarding. No obvious abdominal masses. Msk:  Strength and tone appear normal for age. Extremities: No clubbing or cyanosis. No edema.  Distal pedal pulses are 2+ and equal bilaterally. Neuro: Alert and oriented X 3. No focal deficit. No facial asymmetry. Moves all extremities spontaneously. Psych:  Responds to questions appropriately with a normal affect.    ASSESSMENT AND PLAN:  1. Chest pain concerning for unstable angina 2. HTN, appears controlled 3. Diabetes mellitus 4. Hyperlipidemia 5. AAA 4.7cm in 08/2012  The patient was seen and examined by Dr. Sallyanne Kuster. Plan cardiac catheterization this afternoon. Continue aspirin, BB, statin. Home medications have been ordered with exception of holding Metformin. Will add CBG checks/SSI. See below for additional thoughts.  Signed, Nedra Hai Dunn PA-C 06/06/2013, 2:30 PM   I have seen and examined the patient along with Melina Copa, PA.  I have reviewed the chart, notes and new data.  I agree with PA's note.  Key new complaints: symptoms are strongly reminiscent of previous presentations with occluded LCX coronary artery Key examination changes: no arrhythmia or signs of CHF Key new findings / data: ECG low risk, but was also silent with occluded LCX in 2012.  PLAN: Prefer early coronary angiography and, if necessary, PCI. Body habitus and coronary disease distribution will make noninvasive studies less reliable in this patient. Radial approach preferable due to obesity and presence of AAA. This procedure has been fully reviewed with the patient and written informed consent has been obtained.   Sanda Klein, MD, [place] [phone] 06/06/2013, 2:37 PM

## 2013-06-06 NOTE — ED Notes (Signed)
Pt reports that she has been having chest pain for the past week. States that she called her cardiologist and was told to come here for evaluation. States that she took 2 nitro and didn't relieve the pain. Reports pain is a 9/10 at this time.

## 2013-06-06 NOTE — ED Provider Notes (Signed)
.Medical screening examination/treatment/procedure(s) were conducted as a shared visit with non-physician practitioner(s) and myself.  I personally evaluated the patient during the encounter.  Patient with a history of atypical type chest pain however frequently has had significant coronary disease. Patient will be admitted by cardiology. Workup here her EKG without acute changes. First troponin without acute changes. Chest x-ray also showed no evidence of pulmonary edema or pneumothorax or pneumonia. Patient will require chest pain rule out admission.     EKG Interpretation   Date/Time:  Thursday Jun 06 2013 11:36:37 EDT Ventricular Rate:  76 PR Interval:  156 QRS Duration: 92 QT Interval:  392 QTC Calculation: 441 R Axis:   -57 Text Interpretation:  Normal sinus rhythm Left axis deviation Incomplete  right bundle branch block Abnormal ECG No significant change since last  tracing Confirmed by Murlene Revell  MD, Kollen Armenti [phone]) on 06/06/2013 11:57:55 AM      Results for orders placed during the hospital encounter of 06/06/13  CBC      Result Value Ref Range   WBC 9.5  4.0 - 10.5 K/uL   RBC 4.98  3.87 - 5.11 MIL/uL   Hemoglobin 14.4  12.0 - 15.0 g/dL   HCT 43.6  36.0 - 46.0 %   MCV 87.6  78.0 - 100.0 fL   MCH 28.9  26.0 - 34.0 pg   MCHC 33.0  30.0 - 36.0 g/dL   RDW 13.8  11.5 - 15.5 %   Platelets 273  150 - 400 K/uL  BASIC METABOLIC PANEL      Result Value Ref Range   Sodium 142  137 - 147 mEq/L   Potassium 3.9  3.7 - 5.3 mEq/L   Chloride 106  96 - 112 mEq/L   CO2 22  19 - 32 mEq/L   Glucose, Bld 108 (*) 70 - 99 mg/dL   BUN 10  6 - 23 mg/dL   Creatinine, Ser 0.78  0.50 - 1.10 mg/dL   Calcium 8.9  8.4 - 10.5 mg/dL   GFR calc non Af Amer 85 (*) >90 mL/min   GFR calc Af Amer >90  >90 mL/min  PRO B NATRIURETIC PEPTIDE      Result Value Ref Range   Pro B Natriuretic peptide (BNP) 39.9  0 - 125 pg/mL  CBC      Result Value Ref Range   WBC 7.3  4.0 - 10.5 K/uL   RBC 4.68  3.87 -  5.11 MIL/uL   Hemoglobin 13.5  12.0 - 15.0 g/dL   HCT 41.2  36.0 - 46.0 %   MCV 88.0  78.0 - 100.0 fL   MCH 28.8  26.0 - 34.0 pg   MCHC 32.8  30.0 - 36.0 g/dL   RDW 13.6  11.5 - 15.5 %   Platelets 255  150 - 400 K/uL  PROTIME-INR      Result Value Ref Range   Prothrombin Time 13.5  11.6 - 15.2 seconds   INR 1.05  0.00 - 1.49  I-STAT TROPOININ, ED      Result Value Ref Range   Troponin i, poc 0.00  0.00 - 0.08 ng/mL   Comment 3           CBG MONITORING, ED      Result Value Ref Range   Glucose-Capillary 108 (*) 70 - 99 mg/dL  I-STAT TROPOININ, ED      Result Value Ref Range   Troponin i, poc 0.00  0.00 - 0.08 ng/mL  Comment 3            Dg Chest 2 View  06/06/2013   CLINICAL DATA:  Chest pain  EXAM: CHEST  2 VIEW  COMPARISON:  03/05/2012  FINDINGS: Cardiac shadow is within normal limits. The lungs are well aerated bilaterally. No focal infiltrate or sizable effusion is seen. No acute bony abnormality is noted.  IMPRESSION: No active cardiopulmonary disease.   Electronically Signed   By: Inez Catalina M.D.   On: 06/06/2013 13:12      [name], MD 06/06/13 1536

## 2013-06-07 ENCOUNTER — Encounter (HOSPITAL_COMMUNITY)
Admission: EM | Disposition: A | Discharge status: Home / Self Care | Payer: Medicare Other | Source: Home / Self Care | Attending: Cardiovascular Disease

## 2013-06-07 DIAGNOSIS — I200 Unstable angina: Secondary | ICD-10-CM | POA: Diagnosis not present

## 2013-06-07 DIAGNOSIS — Z9861 Coronary angioplasty status: Secondary | ICD-10-CM | POA: Diagnosis not present

## 2013-06-07 DIAGNOSIS — I2510 Atherosclerotic heart disease of native coronary artery without angina pectoris: Secondary | ICD-10-CM

## 2013-06-07 DIAGNOSIS — E785 Hyperlipidemia, unspecified: Secondary | ICD-10-CM | POA: Diagnosis not present

## 2013-06-07 HISTORY — PX: LEFT HEART CATHETERIZATION WITH CORONARY ANGIOGRAM: SHX5451

## 2013-06-07 LAB — GLUCOSE, CAPILLARY
GLUCOSE-CAPILLARY: 174 mg/dL — AB (ref 70–99)
Glucose-Capillary: 121 mg/dL — ABNORMAL HIGH (ref 70–99)
Glucose-Capillary: 130 mg/dL — ABNORMAL HIGH (ref 70–99)
Glucose-Capillary: 151 mg/dL — ABNORMAL HIGH (ref 70–99)

## 2013-06-07 LAB — LIPID PANEL
Cholesterol: 158 mg/dL (ref 0–200)
HDL: 25 mg/dL — ABNORMAL LOW (ref >39)
LDL Cholesterol: 86 mg/dL (ref 0–99)
Total CHOL/HDL Ratio: 6.3 RATIO
Triglycerides: 235 mg/dL — ABNORMAL HIGH (ref <150)
VLDL: 47 mg/dL — AB (ref 0–40)

## 2013-06-07 LAB — POCT ACTIVATED CLOTTING TIME: ACTIVATED CLOTTING TIME: 382 s

## 2013-06-07 LAB — TROPONIN I

## 2013-06-07 SURGERY — LEFT HEART CATHETERIZATION WITH CORONARY ANGIOGRAM
Anesthesia: LOCAL

## 2013-06-07 MED ORDER — SODIUM CHLORIDE 0.9 % IV SOLN: INTRAVENOUS | Status: AC

## 2013-06-07 MED ORDER — MIDAZOLAM HCL 2 MG/2ML IJ SOLN
INTRAMUSCULAR | Status: AC
  Filled 2013-06-07: qty 2

## 2013-06-07 MED ORDER — CLOPIDOGREL BISULFATE 75 MG PO TABS
75.0000 mg | ORAL_TABLET | Freq: Every day | ORAL | Status: DC
  Administered 2013-06-08: 10:00:00 75 mg via ORAL
  Filled 2013-06-07: qty 1

## 2013-06-07 MED ORDER — HEPARIN (PORCINE) IN NACL 2-0.9 UNIT/ML-% IJ SOLN
INTRAMUSCULAR | Status: AC
  Filled 2013-06-07: qty 1000

## 2013-06-07 MED ORDER — HEPARIN SODIUM (PORCINE) 1000 UNIT/ML IJ SOLN
INTRAMUSCULAR | Status: AC
  Filled 2013-06-07: qty 1

## 2013-06-07 MED ORDER — FAMOTIDINE IN NACL 20-0.9 MG/50ML-% IV SOLN
INTRAVENOUS | Status: AC
  Filled 2013-06-07: qty 50

## 2013-06-07 MED ORDER — LIDOCAINE HCL (PF) 1 % IJ SOLN
INTRAMUSCULAR | Status: AC
  Filled 2013-06-07: qty 30

## 2013-06-07 MED ORDER — CLOPIDOGREL BISULFATE 300 MG PO TABS
ORAL_TABLET | ORAL | Status: AC
  Filled 2013-06-07: qty 1

## 2013-06-07 MED ORDER — VERAPAMIL HCL 2.5 MG/ML IV SOLN
INTRAVENOUS | Status: AC
  Filled 2013-06-07: qty 2

## 2013-06-07 MED ORDER — LIVING WELL WITH DIABETES BOOK
Freq: Once | Status: AC
  Administered 2013-06-07: 19:00:00
  Filled 2013-06-07: qty 1

## 2013-06-07 MED ORDER — FENTANYL CITRATE 0.05 MG/ML IJ SOLN
INTRAMUSCULAR | Status: AC
  Filled 2013-06-07: qty 2

## 2013-06-07 MED ORDER — NITROGLYCERIN 0.2 MG/ML ON CALL CATH LAB
INTRAVENOUS | Status: AC
  Filled 2013-06-07: qty 1

## 2013-06-07 MED ORDER — GI COCKTAIL ~~LOC~~
30.0000 mL | Freq: Once | ORAL | Status: AC
  Administered 2013-06-07: 30 mL via ORAL
  Filled 2013-06-07: qty 30

## 2013-06-07 MED ORDER — BIVALIRUDIN 250 MG IV SOLR
INTRAVENOUS | Status: AC
  Filled 2013-06-07: qty 250

## 2013-06-07 NOTE — Progress Notes (Signed)
Spoke with patient regarding her last HgbA1C of 7.1% in 2014. (up from 6.6% in 2013. Pt insists that she was told by her primary MD, [name] that she needed 'to watch it'.  Made op ed referral to be cosigned and left her teaching materials.  Thank you, Rosita Kea, RN, CNS, Diabetes Coordinator [phone])

## 2013-06-07 NOTE — Interval H&P Note (Signed)
History and Physical Interval Note:  06/07/2013 7:38 AM  [name]  has presented today for surgery, with the diagnosis of cp  The various methods of treatment have been discussed with the patient and family. After consideration of risks, benefits and other options for treatment, the patient has consented to  Procedure(s): LEFT HEART CATHETERIZATION WITH CORONARY ANGIOGRAM (N/A) as a surgical intervention .  The patient's history has been reviewed, patient examined, no change in status, stable for surgery.  I have reviewed the patient's chart and labs.  Questions were answered to the patient's satisfaction.    Cath Lab Visit (complete for each Cath Lab visit)  Clinical Evaluation Leading to the Procedure:   ACS: no  Non-ACS:    Anginal Classification: CCS III  Anti-ischemic medical therapy: Minimal Therapy (1 class of medications)  Non-Invasive Test Results: No non-invasive testing performed  Prior CABG: No previous CABG       [name]

## 2013-06-07 NOTE — CV Procedure (Signed)
Cardiac Catheterization Operative Report  [name] [id number] 5/22/20158:46 AM [name], MD  Procedure Performed:  1. Left Heart Catheterization 2. Selective Coronary Angiography 3. Left ventricular angiogram 4. PTCA/DES x 1 proximal LAD  Operator: Lauree Chandler, MD  Arterial access site:  Right radial artery.   Indication: 68 yo female with h/o CAD with prior stenting of the LAD, Circumflex admitted with chest pain c/w unstable angina (class III). Cardiac markers negative.                                    Procedure Details: The risks, benefits, complications, treatment options, and expected outcomes were discussed with the patient. The patient and/or family concurred with the proposed plan, giving informed consent. The patient was brought to the cath lab after IV hydration was begun and oral premedication was given. The patient was further sedated with Versed and Fentanyl. She was very emotional on the cath table screaming in pain, demanding to be further sedated despite having periods of deep sedation and snoring. The right wrist was assessed with an Allens test which was positive. The right wrist was prepped and draped in a sterile fashion. 1% lidocaine was used for local anesthesia. Using the modified Seldinger access technique, a 5/6 French sheath was placed in the right radial artery. 3 mg Verapamil was given through the sheath. 5000 units IV heparin was given. Standard diagnostic catheters were used to perform selective coronary angiography. A pigtail catheter was used to perform a left ventricular angiogram. She was found to have a severe stenosis in the proximal LAD and we elected to proceed to PCI of the LAD.   PCI Note: She was given a bolus of Angiomax and a drip was started. She was given 600 Plavix po x 1. I engaged the left main with a XB LAD 3.5 guiding catheter. When the ACT was over 200, I passed a Cougar IC wire down the LAD. A 2.5 x 12 mm balloon  was used to pre-dilate the proximal stenosis. A 3.0 x 22 mm Resolute DES was deployed in the proximal LAD. The stent was post-dilated with a 3.25 x 15 mm Beaver balloon at high pressures.  The stenosis was taken from 90% down to 0%.  The sheath was removed from the right radial artery and a Terumo hemostasis band was applied at the arteriotomy site on the right wrist.   There were no immediate complications. The patient was taken to the recovery area in stable condition.   Hemodynamic Findings: Central aortic pressure: 112/68 Left ventricular pressure: 121/10/18   Angiographic Findings:  Left main: No obstructive disease.   Left Anterior Descending Artery: Large caliber vessel that courses to the apex. The proximal vessel has a 90% stenosis. The mid stented segment is patent with 40% restenosis on the proximal edge of the stent. The distal vessel has diffuse plaque. The small caliber diagonal branch has proximal 40% stenosis, mid 70% stenosis in an inferior sub-branch after the bifurcation.   Circumflex Artery: Moderate caliber vessel with patent stent OM1. Mild diffuse disease.   Right Coronary Artery: Moderate caliber diffusely diseased vessel. The proximal vessel has diffuse 50% stenosis. The mid vessel has diffuse 40% stenosis. The distal vessel has mild plaque. The PDA is a small caliber vessel with mild plaque.   Left Ventricular Angiogram: LVEF=65%  Impression: 1. Unstable angina 2. Severe stenosis proximal LAD 3.  Successful PTCA/DES x 1 proximal LAD 4. Normal LV function.   Recommendations: ASA/Plavix for lifetime. Continue statin, beta blocker. Discharge home in am if stable.        Complications:  None. The patient tolerated the procedure well.

## 2013-06-07 NOTE — Progress Notes (Signed)
Spoke with Francesca Oman, RN regarding pt being 'in denial 'with her diabetes stating she is 'borderline'. Last A1C is 7.1% in 2014. Request A1C while here. Will order OP education who will contact her for appt. RN to order RD consult while here as well. Will try to see pt before the end of the day. Thank you, Rosita Kea, RN, CNS, Diabetes Coordinator [phone])

## 2013-06-08 ENCOUNTER — Encounter (HOSPITAL_COMMUNITY): Payer: Self-pay | Admitting: Nurse Practitioner

## 2013-06-08 DIAGNOSIS — I200 Unstable angina: Secondary | ICD-10-CM | POA: Diagnosis not present

## 2013-06-08 DIAGNOSIS — E785 Hyperlipidemia, unspecified: Secondary | ICD-10-CM | POA: Diagnosis not present

## 2013-06-08 DIAGNOSIS — I2510 Atherosclerotic heart disease of native coronary artery without angina pectoris: Secondary | ICD-10-CM | POA: Diagnosis not present

## 2013-06-08 DIAGNOSIS — Z9861 Coronary angioplasty status: Secondary | ICD-10-CM | POA: Diagnosis not present

## 2013-06-08 LAB — GLUCOSE, CAPILLARY: Glucose-Capillary: 106 mg/dL — ABNORMAL HIGH (ref 70–99)

## 2013-06-08 LAB — CBC
HCT: 40.5 % (ref 36.0–46.0)
HEMOGLOBIN: 13.5 g/dL (ref 12.0–15.0)
MCH: 29.1 pg (ref 26.0–34.0)
MCHC: 33.3 g/dL (ref 30.0–36.0)
MCV: 87.3 fL (ref 78.0–100.0)
Platelets: 239 10*3/uL (ref 150–400)
RBC: 4.64 MIL/uL (ref 3.87–5.11)
RDW: 13.5 % (ref 11.5–15.5)
WBC: 5.2 10*3/uL (ref 4.0–10.5)

## 2013-06-08 LAB — BASIC METABOLIC PANEL
BUN: 8 mg/dL (ref 6–23)
CO2: 24 mEq/L (ref 19–32)
Calcium: 8.5 mg/dL (ref 8.4–10.5)
Chloride: 104 mEq/L (ref 96–112)
Creatinine, Ser: 0.81 mg/dL (ref 0.50–1.10)
GFR calc Af Amer: 85 mL/min — ABNORMAL LOW (ref >90)
GFR, EST NON AFRICAN AMERICAN: 73 mL/min — AB (ref >90)
GLUCOSE: 134 mg/dL — AB (ref 70–99)
POTASSIUM: 3.9 meq/L (ref 3.7–5.3)
SODIUM: 140 meq/L (ref 137–147)

## 2013-06-08 MED ORDER — METFORMIN HCL ER 500 MG PO TB24: 2000.0000 mg | ORAL_TABLET | Freq: Every day | ORAL | Status: DC

## 2013-06-08 MED ORDER — CLOPIDOGREL BISULFATE 75 MG PO TABS: 75.0000 mg | ORAL_TABLET | Freq: Every day | ORAL | Status: DC

## 2013-06-08 NOTE — Discharge Summary (Signed)
Discharge Summary   Patient ID: [name],  MRN: [id number], DOB/AGE: 1945/07/27 68 y.o.  Admit date: 06/06/2013 Discharge date: 06/08/2013  Primary Care Provider: [name] Primary Cardiologist: Jerilynn Mages. Burt Knack, MD   Discharge Diagnoses Principal Problem:   Unstable angina pectoris  **Status post successful PCI and drug-eluting stent placement within the proximal LAD during this admission.  Active Problems:   CORONARY ARTERY DISEASE   DIABETES MELLITUS, TYPE II   OBESITY, MODERATE   HYPERTENSION   HYPOTHYROIDISM   HYPERLIPIDEMIA   ANXIETY   DEPRESSION  Allergies Allergies  Allergen Reactions  . Doxycycline     REACTION: gi upset  . Miconazole Nitrate     REACTION: hives  . Prilosec [Omeprazole] Other (See Comments)    Chest pain  . Augmentin [Amoxicillin-Pot Clavulanate] Hives, Itching and Rash   Procedures  Cardiac Catheterization and Percutaneous Coronary Intervention 5.22.2015  Hemodynamic Findings: Central aortic pressure: 112/68 Left ventricular pressure: 121/10/18    Angiographic Findings:  Left main: No obstructive disease.    Left Anterior Descending Artery: Large caliber vessel that courses to the apex. The proximal vessel has a 90% stenosis. The mid stented segment is patent with 40% restenosis on the proximal edge of the stent. The distal vessel has diffuse plaque. The small caliber diagonal branch has proximal 40% stenosis, mid 70% stenosis in an inferior sub-branch after the bifurcation.      **The proximal LAD was successfully stented using a 3.0 x 22 mm Resolute Integrity DES.**  Circumflex Artery: Moderate caliber vessel with patent stent OM1. Mild diffuse disease.    Right Coronary Artery: Moderate caliber diffusely diseased vessel. The proximal vessel has diffuse 50% stenosis. The mid vessel has diffuse 40% stenosis. The distal vessel has mild plaque. The PDA is a small caliber vessel with mild plaque.   Left Ventricular Angiogram:  LVEF=65% _____________   History of Present Illness  68 year old female with prior history of CAD status post interventions within the RCA, OM1, and LAD in the past. She also has a history of hypertension, hyperlipidemia, diabetes mellitus, abdominal aortic aneurysm, and remote tobacco abuse. She was in her usual state of health until approximately 2 weeks prior to admission when she began to experience intermittent substernal chest discomfort. On the day of admission, she had a prolonged episode of chest discomfort associated with dyspnea that was unrelieved with nitroglycerin. She presented to the [facility] where ECG was nonacute and initial troponin was normal. She eventually achieved pain relief after receiving intravenous morphine. She was admitted for further evaluation.  Hospital Course  Patient ruled out for myocardial infarction. Given progression of symptoms, decision was made to pursue diagnostic catheterization. This was performed on May 22, revealing patency of the previously placed mid LAD and OM1 stents. She had a new 90% stenosis within the proximal LAD however. This was successfully intervened upon using a 3.0 x 22 mm resolute integrity drug-eluting stent. She tolerated procedure well and post procedure has been ambulating without recurrent symptoms or limitations. She will be discharged home today in good condition.  Discharge Vitals Blood pressure 131/78, pulse 68, temperature 98.9 F (37.2 C), temperature source Oral, resp. rate 18, height 5\' 6"  (1.676 m), weight 232 lb 2.3 oz (105.3 kg), SpO2 97.00%.  Filed Weights   06/06/13 1138 06/06/13 1407 06/08/13 0012  Weight: 235 lb (106.595 kg) 230 lb (104.327 kg) 232 lb 2.3 oz (105.3 kg)    Labs  CBC  Recent Labs  06/06/13 1439 06/08/13 0314  WBC 7.3 5.2  HGB 13.5 13.5  HCT 41.2 40.5  MCV 88.0 87.3  PLT 255 643   Basic Metabolic Panel  Recent Labs  06/06/13 1155 06/06/13 1439 06/08/13 0314  NA 142  --  140  K 3.9   --  3.9  CL 106  --  104  CO2 22  --  24  GLUCOSE 108*  --  134*  BUN 10  --  8  CREATININE 0.78 0.86 0.81  CALCIUM 8.9  --  8.5   Cardiac Enzymes  Recent Labs  06/06/13 2040 06/07/13 0138  TROPONINI <0.30 <0.30   Fasting Lipid Panel  Recent Labs  06/07/13 0138  CHOL 158  HDL 25*  LDLCALC 86  TRIG 235*  CHOLHDL 6.3   Thyroid Function Tests  Recent Labs  06/06/13 1439  TSH 1.920   Disposition  Pt is being discharged home today in good condition.  Follow-up Plans & Appointments  Follow-up Information   Follow up with [name], MD On 08/22/2013. (2:00 PM)    Specialties:  Internal Medicine, Radiology   Contact information:   [address] [place] [address] [phone]       Follow up with Sherren Mocha, MD. (we will arrange for f/u w/in 2 wks with Dr. Antionette Char NP or PA.)    Specialty:  Cardiology   Contact information:   [address] [address] [phone]       Discharge Medications    Medication List         aspirin EC 81 MG tablet  Take 81 mg by mouth 4 (four) times a week. Take4 times a week tue thur sat sun     atenolol 25 MG tablet  Commonly known as:  TENORMIN  Take 1 tablet (25 mg total) by mouth 2 (two) times daily.     cetirizine 10 MG chewable tablet  Commonly known as:  ZYRTEC  Chew 10 mg by mouth daily as needed for allergies.     clopidogrel 75 MG tablet  Commonly known as:  PLAVIX  Take 1 tablet (75 mg total) by mouth daily with breakfast.     hydrOXYzine 10 MG tablet  Commonly known as:  ATARAX/VISTARIL  Take 1 tablet (10 mg total) by mouth 3 (three) times daily as needed for itching.     levothyroxine 150 MCG tablet  Commonly known as:  SYNTHROID, LEVOTHROID  Take 1 tablet (150 mcg total) by mouth daily.     metFORMIN 500 MG 24 hr tablet  Commonly known as:  GLUCOPHAGE-XR  Take 4 tablets (2,000 mg total) by mouth daily with breakfast.     nitroGLYCERIN 0.4 MG SL tablet   Commonly known as:  NITROSTAT  Place 1 tablet (0.4 mg total) under the tongue every 5 (five) minutes as needed.     oxyCODONE 5 MG immediate release tablet  Commonly known as:  Oxy IR/ROXICODONE  1-2 tabs every 6 hours as needed for pain, with limit 4 tabs per day - to fill Apr 19, 2013     rosuvastatin 20 MG tablet  Commonly known as:  CRESTOR  Take 1 tablet (20 mg total) by mouth daily.     traMADol 50 MG tablet  Commonly known as:  ULTRAM  Take 1 tablet (50 mg total) by mouth every 6 (six) hours as needed for pain.       Outstanding Labs/Studies  None  Duration of Discharge Encounter  Greater than 30 minutes including physician time.  Signed, Rogelia Mire NP 06/08/2013, 8:25 AM

## 2013-06-08 NOTE — Progress Notes (Signed)
    Subjective:  Denies SSCP, palpitations or Dyspnea   Objective:  Filed Vitals:   06/07/13 2024 06/08/13 0012 06/08/13 0441 06/08/13 0741  BP: 110/49 122/39 135/73 131/78  Pulse: 62 71 68 68  Temp: 97.6 F (36.4 C) 98.5 F (36.9 C) 98.3 F (36.8 C) 98.9 F (37.2 C)  TempSrc: Oral Oral Oral Oral  Resp: 17 18 18 18   Height:      Weight:  232 lb 2.3 oz (105.3 kg)    SpO2: 97% 95% 96% 97%    Intake/Output from previous day:  Intake/Output Summary (Last 24 hours) at 06/08/13 0816 Last data filed at 06/07/13 1500  Gross per 24 hour  Intake    640 ml  Output    450 ml  Net    190 ml    Physical Exam: Affect appropriate Healthy:  appears stated age HEENT: normal Neck supple with no adenopathy JVP normal no bruits no thyromegaly Lungs clear with no wheezing and good diaphragmatic motion Heart:  S1/S2 no murmur, no rub, gallop or click PMI normal Abdomen: benighn, BS positve, no tenderness, no AAA no bruit.  No HSM or HJR Distal pulses intact with no bruits No edema Neuro non-focal Skin warm and dry No muscular weakness Right radial artery mildly tender no echymosis and palpable pulse  Lab Results: Basic Metabolic Panel:  Recent Labs  06/06/13 1155 06/06/13 1439 06/08/13 0314  NA 142  --  140  K 3.9  --  3.9  CL 106  --  104  CO2 22  --  24  GLUCOSE 108*  --  134*  BUN 10  --  8  CREATININE 0.78 0.86 0.81  CALCIUM 8.9  --  8.5   CBC:  Recent Labs  06/06/13 1439 06/08/13 0314  WBC 7.3 5.2  HGB 13.5 13.5  HCT 41.2 40.5  MCV 88.0 87.3  PLT 255 239   Cardiac Enzymes:  Recent Labs  06/06/13 2040 06/07/13 0138  TROPONINI <0.30 <0.30   Fasting Lipid Panel:  Recent Labs  06/07/13 0138  CHOL 158  HDL 25*  LDLCALC 86  TRIG 235*  CHOLHDL 6.3   Thyroid Function Tests:  Recent Labs  06/06/13 1439  TSH 1.920    Imaging: Imaging results have been reviewed  Cardiac Studies:  ECG:   NSR normal ECG   Telemetry:  NSR no  arrhythmia  Echo:   Medications:   . aspirin EC  81 mg Oral Daily  . atenolol  25 mg Oral BID  . atorvastatin  40 mg Oral q1800  . clopidogrel  75 mg Oral Q breakfast  . insulin aspart  0-9 Units Subcutaneous TID WC  . levothyroxine  150 mcg Oral QAC breakfast       Assessment/Plan:   CAD:  S/P DES to LAD  Stable no chest pain and good angiographic result  D/C home  DAT 6 months HTN:  Continue beta blocker Chol: continue statin  D/C home this am   Josue Hector .8:19 AM

## 2013-06-08 NOTE — Discharge Instructions (Signed)
**  PLEASE REMEMBER TO BRING ALL OF YOUR MEDICATIONS TO EACH OF YOUR FOLLOW-UP OFFICE VISITS. ° °NO HEAVY LIFTING OR SEXUAL ACTIVITY X 7 DAYS. °NO DRIVING X 3-5 DAYS. °NO SOAKING BATHS, HOT TUBS, POOLS, ETC., X 7 DAYS.  ° °Radial Site Care °Refer to this sheet in the next few weeks. These instructions provide you with information on caring for yourself after your procedure. Your caregiver may also give you more specific instructions. Your treatment has been planned according to current medical practices, but problems sometimes occur. Call your caregiver if you have any problems or questions after your procedure. °HOME CARE INSTRUCTIONS °· You may shower the day after the procedure. Remove the bandage (dressing) and gently wash the site with plain soap and water. Gently pat the site dry.  °· Do not apply powder or lotion to the site.  °· Do not submerge the affected site in water for 3 to 5 days.  °· Inspect the site at least twice daily.  °· Do not flex or bend the affected arm for 24 hours.  °· No lifting over 5 pounds (2.3 kg) for 5 days after your procedure.  ° °What to expect: °· Any bruising will usually fade within 1 to 2 weeks.  °· Blood that collects in the tissue (hematoma) may be painful to the touch. It should usually decrease in size and tenderness within 1 to 2 weeks.  °SEEK IMMEDIATE MEDICAL CARE IF: °· You have unusual pain at the radial site.  °· You have redness, warmth, swelling, or pain at the radial site.  °· You have drainage (other than a small amount of blood on the dressing).  °· You have chills.  °· You have a fever or persistent symptoms for more than 72 hours.  °· You have a fever and your symptoms suddenly get worse.  °· Your arm becomes pale, cool, tingly, or numb.  °· You have heavy bleeding from the site. Hold pressure on the site.  ° °

## 2013-06-08 NOTE — Progress Notes (Signed)
CARDIAC REHAB PHASE I   PRE:  Rate/Rhythm: 76 SR  BP:  Supine:   Sitting: 114/69  Standing:    SaO2:   MODE:  Ambulation: 400 ft   POST:  Rate/Rhythm: 96 SR  BP:  Supine:   Sitting: 121/75  Standing:    SaO2:  0830-0930 Waited for pt to eat breakfast. Pt  Walked 400 ft with slow steady gait. C/o back pain with walk. No Cp. Education completed. Pt instructed in carb counting. Stated she eats only at one meal a day. Discussed healthy food choices. Pt has attended CRP 2 twice and does not want to attend again. Encouraged walking as tolerated for ex. Gave stent card and discussed plavix. Pt stated she does not smoke anymore but did review how nicotine affects arteries.   Graylon Good, RN BSN  06/08/2013 9:26 AM

## 2013-06-11 MED FILL — Sodium Chloride IV Soln 0.9%: INTRAVENOUS | Qty: 50 | Status: AC

## 2013-06-24 ENCOUNTER — Encounter: Payer: Self-pay | Admitting: Family

## 2013-06-25 ENCOUNTER — Ambulatory Visit (HOSPITAL_COMMUNITY)
Admission: RE | Admit: 2013-06-25 | Discharge: 2013-06-25 | Disposition: A | Discharge status: Home / Self Care | Payer: Medicare Other | Source: Ambulatory Visit | Attending: Family | Admitting: Family

## 2013-06-25 ENCOUNTER — Encounter: Payer: Self-pay | Admitting: Family

## 2013-06-25 ENCOUNTER — Ambulatory Visit (INDEPENDENT_AMBULATORY_CARE_PROVIDER_SITE_OTHER): Payer: Medicare Other | Admitting: Family

## 2013-06-25 ENCOUNTER — Ambulatory Visit (INDEPENDENT_AMBULATORY_CARE_PROVIDER_SITE_OTHER): Payer: Medicare Other | Admitting: Physician Assistant

## 2013-06-25 ENCOUNTER — Encounter: Payer: Self-pay | Admitting: Physician Assistant

## 2013-06-25 VITALS — BP 102/62 | HR 80 | Ht 66.0 in | Wt 230.0 lb

## 2013-06-25 VITALS — BP 113/76 | HR 78 | Resp 16 | Wt 229.0 lb

## 2013-06-25 DIAGNOSIS — I714 Abdominal aortic aneurysm, without rupture, unspecified: Secondary | ICD-10-CM

## 2013-06-25 DIAGNOSIS — Z48812 Encounter for surgical aftercare following surgery on the circulatory system: Secondary | ICD-10-CM

## 2013-06-25 DIAGNOSIS — I2510 Atherosclerotic heart disease of native coronary artery without angina pectoris: Secondary | ICD-10-CM | POA: Diagnosis not present

## 2013-06-25 DIAGNOSIS — I10 Essential (primary) hypertension: Secondary | ICD-10-CM | POA: Diagnosis not present

## 2013-06-25 DIAGNOSIS — E785 Hyperlipidemia, unspecified: Secondary | ICD-10-CM

## 2013-06-25 MED ORDER — ASPIRIN EC 81 MG PO TBEC: 81.0000 mg | DELAYED_RELEASE_TABLET | Freq: Every day | ORAL | Status: DC

## 2013-06-25 NOTE — Progress Notes (Signed)
Cardiology Office Note   Date:  06/25/2013   ID:  [name], DOB Dec 24, 1945, MRN [id number]  PCP:  [name], MD  Cardiologist:  Dr. Sherren Mocha      History of Present Illness: [name] is a 68 y.o. female with a hx of coronary artery disease with previous stenting of the LAD and left circumflex, DM, HTN, HL, AAA (followed by Dr. Donnetta Hutching).  She was recently admitted 5/21-5/23 with unstable angina. CEs remained negative.  LHC demonstrated high grade prox LAD disease that was treated with a Resolute DES.  Mid LAD and OM1 stents were patent.  EF was preserved.  She returns for follow up.  The patient denies chest pain, shortness of breath, syncope, orthopnea, PND or significant pedal edema.      Studies:  - LHC (06/06/13):  prox LAD 90%, mid LAD stent ok with 40% ISR, small caliber Dx prox 40% mid 70% (inf sub-branch), OM1 stent ok, prox RCA 50% ,mid RCA 40%, EF 65%.  PCI:  3.0 x 22 mm Resolute DES to proximal LAD  - Echo (02/2012):  EF 55% to 60%. Wall motion was normal.    Recent Labs: 01/08/2013: ALT 32  06/06/2013: Pro B Natriuretic peptide (BNP) 39.9; TSH 1.920  06/07/2013: HDL Cholesterol by NMR 25*; LDL (calc) 86  06/08/2013: Creatinine 0.81; Hemoglobin 13.5; Potassium 3.9   Wt Readings from Last 3 Encounters:  06/25/13 230 lb (104.327 kg)  06/25/13 229 lb (103.874 kg)  06/08/13 232 lb 2.3 oz (105.3 kg)     Past Medical History  Diagnosis Date  . Coronary artery disease     a. s/p inferior MI 1998->PCI of RCA. b. S/p prior PCI of the OM1 and LAD;  c. 05/2013 Cath/PCI: LM nl, LAD 90p (3.0x22 Resolute DES), 39m w/ patent stent, Diag 40p, 73m, LCX patent OM1 stent, RCA 50p, 29m, EF 65%.  . Hyperlipidemia   . Hypertension   . Obesity, unspecified   . Hypothyroidism   . Diabetes mellitus     TYPE II  . Anxiety state, unspecified   . Depressive disorder, not elsewhere classified   . Degeneration of lumbar or lumbosacral intervertebral disc   . Allergic rhinitis,  cause unspecified   . Lower GI bleed 06/2010    Diverticular bleed  . Chronic LBP   . AAA (abdominal aortic aneurysm)     a. 4.7cm in 08/2012. Followed by [facility].    Current Outpatient Prescriptions  Medication Sig Dispense Refill  . aspirin EC 81 MG tablet Take 81 mg by mouth 4 (four) times a week. Take4 times a week tue thur sat sun      . atenolol (TENORMIN) 25 MG tablet Take 1 tablet (25 mg total) by mouth 2 (two) times daily.  180 tablet  3  . cetirizine (ZYRTEC) 10 MG chewable tablet Chew 10 mg by mouth daily as needed for allergies.      [name] clopidogrel (PLAVIX) 75 MG tablet Take 1 tablet (75 mg total) by mouth daily with breakfast.  30 tablet  6  . hydrOXYzine (ATARAX/VISTARIL) 10 MG tablet Take 1 tablet (10 mg total) by mouth 3 (three) times daily as needed for itching.  90 tablet  1  . levothyroxine (SYNTHROID, LEVOTHROID) 150 MCG tablet Take 1 tablet (150 mcg total) by mouth daily.  90 tablet  3  . metFORMIN (GLUCOPHAGE-XR) 500 MG 24 hr tablet Take 4 tablets (2,000 mg total) by mouth daily with breakfast.      .  nitroGLYCERIN (NITROSTAT) 0.4 MG SL tablet Place 1 tablet (0.4 mg total) under the tongue every 5 (five) minutes as needed.  25 tablet  2  . oxyCODONE (OXY IR/ROXICODONE) 5 MG immediate release tablet 1-2 tabs every 6 hours as needed for pain, with limit 4 tabs per day - to fill Apr 19, 2013  120 tablet  0  . traMADol (ULTRAM) 50 MG tablet Take 1 tablet (50 mg total) by mouth every 6 (six) hours as needed for pain.  60 tablet  1  . rosuvastatin (CRESTOR) 20 MG tablet Take 1 tablet (20 mg total) by mouth daily.  90 tablet  3   No current facility-administered medications for this visit.    Allergies:   Doxycycline; Miconazole nitrate; Prilosec; and Augmentin   Social History:  The patient  reports that she quit smoking about 27 years ago. Her smoking use included Cigarettes. She smoked 0.00 packs per day for 30 years. She has never used smokeless tobacco. She reports that she  does not drink alcohol or use illicit drugs.   Family History:  The patient's family history includes Arthritis in her maternal aunt; Cancer in her maternal aunt; Diabetes in her brother and maternal aunt; Heart attack (age of onset: 54) in her mother; Heart attack (age of onset: 58) in her father; Heart disease in her father and mother. There is no history of Colon cancer.   ROS:  Please see the history of present illness.   No bleeding.   All other systems reviewed and negative.   PHYSICAL EXAM: VS:  BP 102/62  Pulse 80  Ht 5\' 6"  (1.676 m)  Wt 230 lb (104.327 kg)  BMI 37.14 kg/m2 Well nourished, well developed, in no acute distress HEENT: normal Neck: no JVD Cardiac:  normal S1, S2; RRR; no murmur Lungs:  clear to auscultation bilaterally, no wheezing, rhonchi or rales Abd: soft, nontender, no hepatomegaly Ext: no edemaright wrist without hematoma or mass  Skin: warm and dry Neuro:  CNs 2-12 intact, no focal abnormalities noted  EKG:  NSR, HR 80, LAD, inc RBBB, NSSTTW changes     ASSESSMENT AND PLAN:  1. Coronary Artery Disease:  She is doing well since recent PCI to the LAD with a DES.  No further angina.  We discussed the importance of dual antiplatelet therapy.  She has been taking ASA 4 times a week since prior LGI bleed (diverticular).  I have asked her to take ASA QD x 1 month, then resume 4 x per week.  Continue Plavix.  Dr. Lauree Chandler recommended lifelong dual antiplatelet Rx at the time of her PCI.  Continue ASA, Plavix, beta blocker, statin.   2. Hypertension:  Controlled.  3. Hyperlipidemia:  Continue statin. 4. AAA :  Followed by [facility]. 5. Disposition:  F/u with Dr. Sherren Mocha in 2 mos.    Signed, Versie Starks, MHS 06/25/2013 12:53 PM    [facility] [address], [place], [place]  [address] Phone: [phone]; Fax: [phone]

## 2013-06-25 NOTE — Patient Instructions (Signed)
Take Aspirin 81 mg once daily for 1 month.  Then resume take Aspirin 81 mg on Tues, Thurs, Sat and Sun only.  Your physician recommends that you schedule a follow-up appointment in:  2 mos with Dr. Sherren Mocha

## 2013-06-25 NOTE — Progress Notes (Signed)
[facility]  Established Abdominal Aortic Aneurysm  History of Present Illness  [name] is a 68 y.o. ([birth year]) female patient of Dr. Donnetta Hutching.  The patient presents today for continued followup of known infrarenal abdominal aortic aneurysm.   She does have some chronic degenerative disc disease with back pain, with recent exacerbation, relieved by sitting, but no new abdominal pain  Previous studies demonstrate an AAA, measuring 4.7 cm.   The patient denies claudication in legs with walking, denies non healing wounds. The patient denies history of stroke or TIA symptoms. She had a cardiac stent placed recently. Takes ASA 4 days/week, takes daily Plavix.  Pt Diabetic: Yes, A1C 5 months ago was 7.1 Pt smoker: former smoker  (quit in 2012), but admits that she smokes at times when stressed  Past Medical History  Diagnosis Date  . Coronary artery disease     a. s/p inferior MI 1998->PCI of RCA. b. S/p prior PCI of the OM1 and LAD;  c. 05/2013 Cath/PCI: LM nl, LAD 90p (3.0x22 Resolute DES), 67m w/ patent stent, Diag 40p, 32m, LCX patent OM1 stent, RCA 50p, 57m, EF 65%.  . Hyperlipidemia   . Hypertension   . Obesity, unspecified   . Hypothyroidism   . Diabetes mellitus     TYPE II  . Anxiety state, unspecified   . Depressive disorder, not elsewhere classified   . Degeneration of lumbar or lumbosacral intervertebral disc   . Allergic rhinitis, cause unspecified   . Lower GI bleed 06/2010    Diverticular bleed  . Chronic LBP   . AAA (abdominal aortic aneurysm)     a. 4.7cm in 08/2012. Followed by [facility].   Past Surgical History  Procedure Laterality Date  . Total abdominal hysterectomy    . Lumbar fusion  01/2007    DR. Patrice Paradise...3-LEVEL WITH FIXATION  . Parathyroidectomy    . Thyroidectomy    . Cardiac catheterization      PCI OF BOTH THE CIRCUMFLEX AND LEFT ANTERIOR DESCENDING ARTERY  . Cesarean section    . Heart stent  04-2010  and  Jun 07, 2013    X 3  . Spine surgery     Social History History   Social History  . Marital Status: Divorced    Spouse Name: N/A    Number of Children: 3  . Years of Education: N/A   Occupational History  . [employer]    ADMIN SUPPORT  .     Social History Main Topics  . Smoking status: Former Smoker -- 30 years    Types: Cigarettes    Quit date: 05/31/1986  . Smokeless tobacco: Never Used  . Alcohol Use: No  . Drug Use: No  . Sexual Activity: Not on file   Other Topics Concern  . Not on file   Social History Narrative   DIVORCED   3 CHILDREN   PATIENT SIGNED A DESIGNATED PARTY RELEASE TO ALLOW HER DAUGHTER, TRAMAINE Koeller, TO HAVE ACCESS TO HER MEDICAL RECORDS/INFORMATION. [name], May 04, 2009 @ 3:27 PM   Smokes cigarettes on rare occasions.   Family History Family History  Problem Relation Age of Onset  . Heart attack Mother 37    s/p D&C-CARDIAC ARREST 1966  . Heart disease Mother   . Heart attack Father 56    1978 WITH MI  . Heart disease Father   . Colon cancer Neg Hx   . Diabetes Brother   .  Diabetes Maternal Aunt   . Arthritis Maternal Aunt   . Cancer Maternal Aunt     Current Outpatient Prescriptions on File Prior to Visit  Medication Sig Dispense Refill  . aspirin EC 81 MG tablet Take 81 mg by mouth 4 (four) times a week. Take4 times a week tue thur sat sun      . atenolol (TENORMIN) 25 MG tablet Take 1 tablet (25 mg total) by mouth 2 (two) times daily.  180 tablet  3  . cetirizine (ZYRTEC) 10 MG chewable tablet Chew 10 mg by mouth daily as needed for allergies.      [name] clopidogrel (PLAVIX) 75 MG tablet Take 1 tablet (75 mg total) by mouth daily with breakfast.  30 tablet  6  . hydrOXYzine (ATARAX/VISTARIL) 10 MG tablet Take 1 tablet (10 mg total) by mouth 3 (three) times daily as needed for itching.  90 tablet  1  . levothyroxine (SYNTHROID, LEVOTHROID) 150 MCG tablet Take 1 tablet (150 mcg total) by mouth daily.  90 tablet  3  . metFORMIN  (GLUCOPHAGE-XR) 500 MG 24 hr tablet Take 4 tablets (2,000 mg total) by mouth daily with breakfast.      . nitroGLYCERIN (NITROSTAT) 0.4 MG SL tablet Place 1 tablet (0.4 mg total) under the tongue every 5 (five) minutes as needed.  25 tablet  2  . oxyCODONE (OXY IR/ROXICODONE) 5 MG immediate release tablet 1-2 tabs every 6 hours as needed for pain, with limit 4 tabs per day - to fill Apr 19, 2013  120 tablet  0  . traMADol (ULTRAM) 50 MG tablet Take 1 tablet (50 mg total) by mouth every 6 (six) hours as needed for pain.  60 tablet  1  . rosuvastatin (CRESTOR) 20 MG tablet Take 1 tablet (20 mg total) by mouth daily.  90 tablet  3   No current facility-administered medications on file prior to visit.   Allergies  Allergen Reactions  . Doxycycline     REACTION: gi upset  . Miconazole Nitrate     REACTION: hives  . Prilosec [Omeprazole] Other (See Comments)    Chest pain  . Augmentin [Amoxicillin-Pot Clavulanate] Hives, Itching and Rash    ROS: See HPI for pertinent positives and negatives.  Physical Examination  Filed Vitals:   06/25/13 1005  BP: 113/76  Pulse: 78  Resp: 16  Weight: 229 lb (103.874 kg)  SpO2: 98%   Body mass index is 36.98 kg/(m^2).  General: A&O x 3, WD, obese female.  Pulmonary: Sym exp, good air movt, CTAB, no rales, rhonchi, or wheezing.  Cardiac: RRR, Nl S1, S2, no detected murmur.   Carotid Bruits Left Right   Negative Negative   Aorta is not palpable Radial pulses are 2+ palpable and =                          VASCULAR EXAM:  LE Pulses LEFT RIGHT       FEMORAL  2+ palpable  2+ palpable        POPLITEAL  not palpable   not palpable       POSTERIOR TIBIAL  not palpable   not palpable        DORSALIS PEDIS      ANTERIOR TIBIAL 2+ palpable  2+ palpable     Gastrointestinal: soft, NTND, -G/R, - HSM, - masses, - CVAT B.  Musculoskeletal:  M/S 4/5 throughout, Extremities without ischemic changes.  Neurologic: CN 2-12 intact, Pain and light touch intact in extremities are intact except, Motor exam as listed above.  Non-Invasive Vascular Imaging  AAA Duplex (06/25/2013)  Previous size: 4.7 cm (Date: 09/07/12)  Current size:  4.71 cm (Date: 06/25/2013)  Medical Decision Making  The patient is a 68 y.o. female who presents with asymptomatic AAA with no increase in size. She was counseled re smoking cessation.   Based on this patient's exam and diagnostic studies, the patient will follow up in 6 months  with the following studies: AAA Duplex.  Consideration for repair of AAA would be made when the size is 5.5 cm, growth > 1 cm/yr, and symptomatic status.  I emphasized the importance of maximal medical management including strict control of blood pressure, blood glucose, and lipid levels, antiplatelet agents, obtaining regular exercise, and cessation of smoking.   The patient was given information about AAA including signs, symptoms, treatment, and how to minimize the risk of enlargement and rupture of aneurysms.    The patient was advised to call 911 should the patient experience sudden onset abdominal or back pain.   Thank you for allowing [place] to participate in this patient's care.  Clemon Chambers, RN, MSN, FNP-C [facility] Office: [phone]  Clinic Physician: Early  06/25/2013, 10:13 AM

## 2013-06-25 NOTE — Patient Instructions (Addendum)
Abdominal Aortic Aneurysm An aneurysm is a weakened or damaged part of an artery wall that bulges from the normal force of blood pumping through the body. An abdominal aortic aneurysm is an aneurysm that occurs in the lower part of the aorta, the main artery of the body.  The major concern with an abdominal aortic aneurysm is that it can enlarge and burst (rupture) or blood can flow between the layers of the wall of the aorta through a tear (aorticdissection). Both of these conditions can cause bleeding inside the body and can be life threatening unless diagnosed and treated promptly. CAUSES  The exact cause of an abdominal aortic aneurysm is unknown. Some contributing factors are:   A hardening of the arteries caused by the buildup of fat and other substances in the lining of a blood vessel (arteriosclerosis).  Inflammation of the walls of an artery (arteritis).   Connective tissue diseases, such as Marfan syndrome.   Abdominal trauma.   An infection, such as syphilis or staphylococcus, in the wall of the aorta (infectious aortitis) caused by bacteria. RISK FACTORS  Risk factors that contribute to an abdominal aortic aneurysm may include:  Age older than 60 years.   High blood pressure (hypertension).  Female gender.  Ethnicity (white race).  Obesity.  Family history of aneurysm (first degree relatives only).  Tobacco use. PREVENTION  The following healthy lifestyle habits may help decrease your risk of abdominal aortic aneurysm:  Quitting smoking. Smoking can raise your blood pressure and cause arteriosclerosis.  Limiting or avoiding alcohol.  Keeping your blood pressure, blood sugar level, and cholesterol levels within normal limits.  Decreasing your salt intake. In somepeople, too much salt can raise blood pressure and increase your risk of abdominal aortic aneurysm.  Eating a diet low in saturated fats and cholesterol.  Increasing your fiber intake by including  whole grains, vegetables, and fruits in your diet. Eating these foods may help lower blood pressure.  Maintaining a healthy weight.  Staying physically active and exercising regularly. SYMPTOMS  The symptoms of abdominal aortic aneurysm may vary depending on the size and rate of growth of the aneurysm.Most grow slowly and do not have any symptoms. When symptoms do occur, they may include:  Pain (abdomen, side, lower back, or groin). The pain may vary in intensity. A sudden onset of severe pain may indicate that the aneurysm has ruptured.  Feeling full after eating only small amounts of food.  Nausea or vomiting or both.  Feeling a pulsating lump in the abdomen.  Feeling faint or passing out. DIAGNOSIS  Since most unruptured abdominal aortic aneurysms have no symptoms, they are often discovered during diagnostic exams for other conditions. An aneurysm may be found during the following procedures:  Ultrasonography (A one-time screening for abdominal aortic aneurysm by ultrasonography is also recommended for all men aged 65-75 years who have ever smoked).  X-ray exams.  A computed tomography (CT).  Magnetic resonance imaging (MRI).  Angiography or arteriography. TREATMENT  Treatment of an abdominal aortic aneurysm depends on the size of your aneurysm, your age, and risk factors for rupture. Medication to control blood pressure and pain may be used to manage aneurysms smaller than 6 cm. Regular monitoring for enlargement may be recommended by your caregiver if:  The aneurysm is 3 4 cm in size (an annual ultrasonography may be recommended).  The aneurysm is 4 4.5 cm in size (an ultrasonography every 6 months may be recommended).  The aneurysm is larger than 4.5   cm in size (your caregiver may ask that you be examined by a vascular surgeon). If your aneurysm is larger than 6 cm, surgical repair may be recommended. There are two main methods for repair of an aneurysm:   Endovascular  repair (a minimally invasive surgery). This is done most often.  Open repair. This method is used if an endovascular repair is not possible. Document Released: 10/13/2004 Document Revised: 04/30/2012 Document Reviewed: 02/03/2012 ExitCare Patient Information 2014 [employer].   Smoking Cessation Quitting smoking is important to your health and has many advantages. However, it is not always easy to quit since nicotine is a very addictive drug. Often times, people try 3 times or more before being able to quit. This document explains the best ways for you to prepare to quit smoking. Quitting takes hard work and a lot of effort, but you can do it. ADVANTAGES OF QUITTING SMOKING  You will live longer, feel better, and live better.  Your body will feel the impact of quitting smoking almost immediately.  Within 20 minutes, blood pressure decreases. Your pulse returns to its normal level.  After 8 hours, carbon monoxide levels in the blood return to normal. Your oxygen level increases.  After 24 hours, the chance of having a heart attack starts to decrease. Your breath, hair, and body stop smelling like smoke.  After 48 hours, damaged nerve endings begin to recover. Your sense of taste and smell improve.  After 72 hours, the body is virtually free of nicotine. Your bronchial tubes relax and breathing becomes easier.  After 2 to 12 weeks, lungs can hold more air. Exercise becomes easier and circulation improves.  The risk of having a heart attack, stroke, cancer, or lung disease is greatly reduced.  After 1 year, the risk of coronary heart disease is cut in half.  After 5 years, the risk of stroke falls to the same as a nonsmoker.  After 10 years, the risk of lung cancer is cut in half and the risk of other cancers decreases significantly.  After 15 years, the risk of coronary heart disease drops, usually to the level of a nonsmoker.  If you are pregnant, quitting smoking will improve  your chances of having a healthy baby.  The people you live with, especially any children, will be healthier.  You will have extra money to spend on things other than cigarettes. QUESTIONS TO THINK ABOUT BEFORE ATTEMPTING TO QUIT You may want to talk about your answers with your caregiver.  Why do you want to quit?  If you tried to quit in the past, what helped and what did not?  What will be the most difficult situations for you after you quit? How will you plan to handle them?  Who can help you through the tough times? Your family? Friends? A caregiver?  What pleasures do you get from smoking? What ways can you still get pleasure if you quit? Here are some questions to ask your caregiver:  How can you help me to be successful at quitting?  What medicine do you think would be best for me and how should I take it?  What should I do if I need more help?  What is smoking withdrawal like? How can I get information on withdrawal? GET READY  Set a quit date.  Change your environment by getting rid of all cigarettes, ashtrays, matches, and lighters in your home, car, or work. Do not let people smoke in your home.  Review your   past attempts to quit. Think about what worked and what did not. GET SUPPORT AND ENCOURAGEMENT You have a better chance of being successful if you have help. You can get support in many ways.  Tell your family, friends, and co-workers that you are going to quit and need their support. Ask them not to smoke around you.  Get individual, group, or telephone counseling and support. Programs are available at local hospitals and health centers. Call your local health department for information about programs in your area.  Spiritual beliefs and practices may help some smokers quit.  Download a "quit meter" on your computer to keep track of quit statistics, such as how long you have gone without smoking, cigarettes not smoked, and money saved.  Get a self-help  book about quitting smoking and staying off of tobacco. LEARN NEW SKILLS AND BEHAVIORS  Distract yourself from urges to smoke. Talk to someone, go for a walk, or occupy your time with a task.  Change your normal routine. Take a different route to work. Drink tea instead of coffee. Eat breakfast in a different place.  Reduce your stress. Take a hot bath, exercise, or read a book.  Plan something enjoyable to do every day. Reward yourself for not smoking.  Explore interactive web-based programs that specialize in helping you quit. GET MEDICINE AND USE IT CORRECTLY Medicines can help you stop smoking and decrease the urge to smoke. Combining medicine with the above behavioral methods and support can greatly increase your chances of successfully quitting smoking.  Nicotine replacement therapy helps deliver nicotine to your body without the negative effects and risks of smoking. Nicotine replacement therapy includes nicotine gum, lozenges, inhalers, nasal sprays, and skin patches. Some may be available over-the-counter and others require a prescription.  Antidepressant medicine helps people abstain from smoking, but how this works is unknown. This medicine is available by prescription.  Nicotinic receptor partial agonist medicine simulates the effect of nicotine in your brain. This medicine is available by prescription. Ask your caregiver for advice about which medicines to use and how to use them based on your health history. Your caregiver will tell you what side effects to look out for if you choose to be on a medicine or therapy. Carefully read the information on the package. Do not use any other product containing nicotine while using a nicotine replacement product.  RELAPSE OR DIFFICULT SITUATIONS Most relapses occur within the first 3 months after quitting. Do not be discouraged if you start smoking again. Remember, most people try several times before finally quitting. You may have symptoms  of withdrawal because your body is used to nicotine. You may crave cigarettes, be irritable, feel very hungry, cough often, get headaches, or have difficulty concentrating. The withdrawal symptoms are only temporary. They are strongest when you first quit, but they will go away within 10 14 days. To reduce the chances of relapse, try to:  Avoid drinking alcohol. Drinking lowers your chances of successfully quitting.  Reduce the amount of caffeine you consume. Once you quit smoking, the amount of caffeine in your body increases and can give you symptoms, such as a rapid heartbeat, sweating, and anxiety.  Avoid smokers because they can make you want to smoke.  Do not let weight gain distract you. Many smokers will gain weight when they quit, usually less than 10 pounds. Eat a healthy diet and stay active. You can always lose the weight gained after you quit.  Find ways to improve your   mood other than smoking. FOR MORE INFORMATION  www.smokefree.gov  Document Released: 12/28/2000 Document Revised: 07/05/2011 Document Reviewed: 04/14/2011 ExitCare Patient Information 2014 [employer].  

## 2013-07-12 ENCOUNTER — Telehealth: Payer: Self-pay | Admitting: Cardiovascular Disease

## 2013-07-12 DIAGNOSIS — E079 Disorder of thyroid, unspecified: Secondary | ICD-10-CM | POA: Diagnosis not present

## 2013-07-12 DIAGNOSIS — Z79899 Other long term (current) drug therapy: Secondary | ICD-10-CM | POA: Diagnosis not present

## 2013-07-12 DIAGNOSIS — M109 Gout, unspecified: Secondary | ICD-10-CM | POA: Diagnosis not present

## 2013-07-12 DIAGNOSIS — E119 Type 2 diabetes mellitus without complications: Secondary | ICD-10-CM | POA: Diagnosis not present

## 2013-07-12 DIAGNOSIS — M7989 Other specified soft tissue disorders: Secondary | ICD-10-CM | POA: Diagnosis not present

## 2013-07-12 DIAGNOSIS — M259 Joint disorder, unspecified: Secondary | ICD-10-CM | POA: Diagnosis not present

## 2013-07-12 DIAGNOSIS — Z7982 Long term (current) use of aspirin: Secondary | ICD-10-CM | POA: Diagnosis not present

## 2013-07-12 DIAGNOSIS — I10 Essential (primary) hypertension: Secondary | ICD-10-CM | POA: Diagnosis not present

## 2013-07-12 NOTE — Telephone Encounter (Signed)
Pt. Called to let Dr. Burt Knack know that she stop taking the Clopidogrel medication last Sunday 07/07/13, because she had numbness of her hands. The numbness went aware, now she said that she has LE swelling which she thinks is an after effect of that medication. Pt states she was told not to stop taking this medication, but she just can't take this medication. Pt states she is going to see her PCP tomorrow to see what is wrong with her.

## 2013-07-12 NOTE — Telephone Encounter (Signed)
New Prob    Pt has some questions regarding her medications. Please call.

## 2013-07-13 ENCOUNTER — Ambulatory Visit: Payer: Medicare Other | Admitting: Internal Medicine

## 2013-07-14 NOTE — Telephone Encounter (Signed)
Needs to get back on antiplatelet Rx ASAP. If unable to take plavix, would start brilinta 90 mg BID.

## 2013-07-15 NOTE — Telephone Encounter (Signed)
Left message for pt to call back ASAP to discuss medications.

## 2013-07-16 ENCOUNTER — Encounter: Payer: Self-pay | Admitting: Internal Medicine

## 2013-07-16 ENCOUNTER — Ambulatory Visit (INDEPENDENT_AMBULATORY_CARE_PROVIDER_SITE_OTHER): Payer: Medicare Other | Admitting: Internal Medicine

## 2013-07-16 VITALS — BP 100/62 | HR 97 | Temp 98.6°F | Wt 230.4 lb

## 2013-07-16 DIAGNOSIS — E119 Type 2 diabetes mellitus without complications: Secondary | ICD-10-CM | POA: Diagnosis not present

## 2013-07-16 DIAGNOSIS — M109 Gout, unspecified: Secondary | ICD-10-CM | POA: Diagnosis not present

## 2013-07-16 DIAGNOSIS — I10 Essential (primary) hypertension: Secondary | ICD-10-CM

## 2013-07-16 DIAGNOSIS — I2510 Atherosclerotic heart disease of native coronary artery without angina pectoris: Secondary | ICD-10-CM | POA: Diagnosis not present

## 2013-07-16 DIAGNOSIS — J309 Allergic rhinitis, unspecified: Secondary | ICD-10-CM

## 2013-07-16 DIAGNOSIS — M10471 Other secondary gout, right ankle and foot: Secondary | ICD-10-CM

## 2013-07-16 MED ORDER — INDOMETHACIN 50 MG PO CAPS: 50.0000 mg | ORAL_CAPSULE | Freq: Three times a day (TID) | ORAL | Status: DC | PRN

## 2013-07-16 MED ORDER — PREDNISONE 10 MG PO TABS: ORAL_TABLET | ORAL | Status: DC

## 2013-07-16 MED ORDER — METHYLPREDNISOLONE ACETATE 80 MG/ML IJ SUSP
80.0000 mg | Freq: Once | INTRAMUSCULAR | Status: AC
  Administered 2013-07-16: 80 mg via INTRAMUSCULAR

## 2013-07-16 NOTE — Patient Instructions (Signed)
You had the steroid shot today  Please take all new medication as prescribed - the prednisone to start, then later if the pain and swelling return, please take the indomethacin   Please continue all other medications as before, and refills have been done if requested - the percocet  Please have the pharmacy call with any other refills you may need.  Please keep your appointments with your specialists as you may have planned

## 2013-07-16 NOTE — Progress Notes (Signed)
Subjective:    Patient ID: [name], female    DOB: [birth year], 68 y.o.   MRN: [id number]  HPI Here to f/u after seen at [facility], tx with IM toradol and dialudid for right foot pain, and d/c with indomethacin rx , #20 percocet 5/325 but not improved overall.  Toradol did seem to help the first night, but pain came back the next day. Pt denies chest pain, increased sob or doe, wheezing, orthopnea, PND, increased LE swelling, palpitations, dizziness or syncope. States cannot take plavix due to numbness in hands and legs.  Now taking daily asa 81 mg instead of only qod. Does have several wks ongoing nasal allergy symptoms with clearish congestion, itch and sneezing, without fever, pain, ST, cough, swelling or wheezing, but with left > right ear pain and eustach valve symptoms   Pt denies polydipsia, polyuria  Past Medical History  Diagnosis Date  . Coronary artery disease     a. s/p inferior MI 1998->PCI of RCA. b. S/p prior PCI of the OM1 and LAD;  c. 05/2013 Cath/PCI: LM nl, LAD 90p (3.0x22 Resolute DES), 11m w/ patent stent, Diag 40p, 60m, LCX patent OM1 stent, RCA 50p, 48m, EF 65%.  . Hyperlipidemia   . Hypertension   . Obesity, unspecified   . Hypothyroidism   . Diabetes mellitus     TYPE II  . Anxiety state, unspecified   . Depressive disorder, not elsewhere classified   . Degeneration of lumbar or lumbosacral intervertebral disc   . Allergic rhinitis, cause unspecified   . Lower GI bleed 06/2010    Diverticular bleed  . Chronic LBP   . AAA (abdominal aortic aneurysm)     a. 4.7cm in 08/2012. Followed by [facility].   Past Surgical History  Procedure Laterality Date  . Total abdominal hysterectomy    . Lumbar fusion  01/2007    DR. Patrice Paradise...3-LEVEL WITH FIXATION  . Parathyroidectomy    . Thyroidectomy    . Cardiac catheterization      PCI OF BOTH THE CIRCUMFLEX AND LEFT ANTERIOR DESCENDING ARTERY  . Cesarean section    . Heart stent  04-2010  and  Jun 07, 2013    X 3    . Spine surgery      reports that she quit smoking about 27 years ago. Her smoking use included Cigarettes. She smoked 0.00 packs per day for 30 years. She has never used smokeless tobacco. She reports that she does not drink alcohol or use illicit drugs. family history includes Arthritis in her maternal aunt; Cancer in her maternal aunt; Diabetes in her brother and maternal aunt; Heart attack (age of onset: 57) in her mother; Heart attack (age of onset: 11) in her father; Heart disease in her father and mother. There is no history of Colon cancer. Allergies  Allergen Reactions  . Doxycycline     REACTION: gi upset  . Miconazole Nitrate     REACTION: hives  . Prilosec [Omeprazole] Other (See Comments)    Chest pain  . Augmentin [Amoxicillin-Pot Clavulanate] Hives, Itching and Rash   Current Outpatient Prescriptions on File Prior to Visit  Medication Sig Dispense Refill  . aspirin EC 81 MG tablet Take 1 tablet (81 mg total) by mouth daily. For 1 month.  Then resume taking 4 times a week (tue thur sat sun)      . atenolol (TENORMIN) 25 MG tablet Take 1 tablet (25 mg total) by mouth 2 (two) times daily.  180 tablet  3  . cetirizine (ZYRTEC) 10 MG chewable tablet Chew 10 mg by mouth daily as needed for allergies.      . hydrOXYzine (ATARAX/VISTARIL) 10 MG tablet Take 1 tablet (10 mg total) by mouth 3 (three) times daily as needed for itching.  90 tablet  1  . levothyroxine (SYNTHROID, LEVOTHROID) 150 MCG tablet Take 1 tablet (150 mcg total) by mouth daily.  90 tablet  3  . metFORMIN (GLUCOPHAGE-XR) 500 MG 24 hr tablet Take 4 tablets (2,000 mg total) by mouth daily with breakfast.      . nitroGLYCERIN (NITROSTAT) 0.4 MG SL tablet Place 1 tablet (0.4 mg total) under the tongue every 5 (five) minutes as needed.  25 tablet  2  . oxyCODONE (OXY IR/ROXICODONE) 5 MG immediate release tablet 1-2 tabs every 6 hours as needed for pain, with limit 4 tabs per day - to fill Apr 19, 2013  120 tablet  0  .  traMADol (ULTRAM) 50 MG tablet Take 1 tablet (50 mg total) by mouth every 6 (six) hours as needed for pain.  60 tablet  1  . rosuvastatin (CRESTOR) 20 MG tablet Take 1 tablet (20 mg total) by mouth daily.  90 tablet  3  . ticagrelor (BRILINTA) 90 MG TABS tablet Take 1 tablet (90 mg total) by mouth 2 (two) times daily.  60 tablet  6   No current facility-administered medications on file prior to visit.   Review of Systems  Constitutional: Negative for unusual diaphoresis or other sweats  HENT: Negative for ringing in ear Eyes: Negative for double vision or worsening visual disturbance.  Respiratory: Negative for choking and stridor.   Gastrointestinal: Negative for vomiting or other signifcant bowel change Genitourinary: Negative for hematuria or decreased urine volume.  Musculoskeletal: Negative for other MSK pain or swelling Skin: Negative for color change and worsening wound.  Neurological: Negative for tremors and numbness other than noted  Psychiatric/Behavioral: Negative for decreased concentration or agitation other than above       Objective:   Physical Exam BP 100/62  Pulse 97  Temp(Src) 98.6 F (37 C) (Oral)  Wt 230 lb 6 oz (104.497 kg)  SpO2 96% VS noted,  Constitutional: Pt appears well-developed, well-nourished.  HENT: Head: NCAT.  Right Ear: External ear normal.  Left Ear: External ear normal.  Eyes: . Pupils are equal, round, and reactive to light. Conjunctivae and EOM are normal Neck: Normal range of motion. Neck supple.  Cardiovascular: Normal rate and regular rhythm.   Pulmonary/Chest: Effort normal and breath sounds normal.  Abd:  Soft, NT, ND, + BS Neurological: Pt is alert. Not confused , motor grossly intact Skin: Right foot first MTP with 2-3+ red/tender/swelling Psychiatric: Pt behavior is normal. No agitation.     Assessment & Plan:

## 2013-07-16 NOTE — Progress Notes (Signed)
Pre visit review using our clinic review tool, if applicable. No additional management support is needed unless otherwise documented below in the visit note. 

## 2013-07-17 NOTE — Telephone Encounter (Signed)
Left message on machine for pt to contact the office ASAP.

## 2013-07-18 ENCOUNTER — Telehealth: Payer: Self-pay

## 2013-07-18 MED ORDER — TICAGRELOR 90 MG PO TABS: 90.0000 mg | ORAL_TABLET | Freq: Two times a day (BID) | ORAL | Status: DC

## 2013-07-18 NOTE — Telephone Encounter (Signed)
Spoke with pt who reports she received message from [facility] about changing from Plavix to [place]. Pt has stopped Plavix. I explained to her the need for Brilinta 90 mg twice daily and she is willing to take. Will send prescription to [facility] on [address]. I told her I would leave card for free 30 day trial and samples at front desk for her to pick up.  Brilinta 90 mg, 32 tablets, Lot [id number], exp 9/17 left at front desk.

## 2013-07-18 NOTE — Telephone Encounter (Signed)
Pt left message on refill line regarding message from [place]. I spoke with pt. See phone note dated 6/26 for documentation regarding this call

## 2013-07-18 NOTE — Addendum Note (Signed)
Addended by: Thompson Grayer on: 07/18/2013 11:49 AM   Modules accepted: Orders, Medications

## 2013-07-22 NOTE — Assessment & Plan Note (Signed)
Also liekly to improved with steroid tx,  to f/u any worsening symptoms or concerns

## 2013-07-22 NOTE — Assessment & Plan Note (Signed)
Mod to severe, persistent despite ER tx with nsaid/percocet; for depomedrol IM, predpac asd, limited refill percocet prn,  to f/u any worsening symptoms or concernsm for indocin prn future attacks

## 2013-07-22 NOTE — Assessment & Plan Note (Signed)
stable overall by history and exam, recent data reviewed with pt, and pt to continue medical treatment as before,  to f/u any worsening symptoms or concerns Lab Results  Component Value Date   HGBA1C 7.1* 01/08/2013   Pt to call for onset polys or cbg > 200

## 2013-07-22 NOTE — Assessment & Plan Note (Signed)
stable overall by history and exam, recent data reviewed with pt, and pt to continue medical treatment as before,  to f/u any worsening symptoms or concerns BP Readings from Last 3 Encounters:  07/16/13 100/62  06/25/13 102/62  06/25/13 113/76

## 2013-07-29 ENCOUNTER — Telehealth: Payer: Self-pay | Admitting: *Deleted

## 2013-07-29 NOTE — Telephone Encounter (Signed)
[facility] Triage Call Report Triage Record Num: [id number] Operator: Doug Sou Patient Name: [name] Call Date & Time: 07/13/2013 1:12:49AM Patient Phone: [phone] PCP: [name] Patient Gender: Female PCP Fax : [phone] Patient DOB: [birth year] Practice Name: [facility] Reason for Call: Caller: Tramaine/Other; PCP: [name] (Adults only); CB#: [phone]; Call regarding calling to cancel appt for 6/27 at 9 am. States patient was seen in the ED and diagnosed with gout. Appt cancelled in [facility]. Note sent to the office. Protocol(s) Used: Office Note Recommended Outcome per Protocol: Information Noted and Sent to Office Reason for Outcome: Caller information to office Care Advice: ~ 07/13/2013 1:17:17AM Page 1 of 1 [email or web address]

## 2013-08-01 ENCOUNTER — Telehealth: Payer: Self-pay | Admitting: Cardiovascular Disease

## 2013-08-01 NOTE — Telephone Encounter (Signed)
Follow up:    Pt called asking to be seen today by Dr Burt Knack.  Pt needs a call back about her medication today please, pt stated she has not gotten a call back about this yet.   Ticagrelor (Tab) BRILINTA 90 MG Take 1 tablet (90 mg total) by mouth 2 (two) times daily.

## 2013-08-01 NOTE — Telephone Encounter (Signed)
I spoke with the pt and she called the office wanting to be seen because she has a bruise on her arm and her side.  The pt cannot remember bumping into anything or any injury to this area of her body. I made the pt aware that Brilinta does put her at risk for easy bruising. I made the pt aware of the importance of taking this medication and she verbalized understanding. The pt also complained of constipation.  I instructed the pt that she can take OTC Colace as needed.

## 2013-08-03 ENCOUNTER — Ambulatory Visit (INDEPENDENT_AMBULATORY_CARE_PROVIDER_SITE_OTHER): Payer: Medicare Other | Admitting: Family

## 2013-08-03 VITALS — BP 128/74 | HR 72 | Temp 97.9°F | Wt 229.0 lb

## 2013-08-03 DIAGNOSIS — R350 Frequency of micturition: Secondary | ICD-10-CM | POA: Diagnosis not present

## 2013-08-03 DIAGNOSIS — IMO0001 Reserved for inherently not codable concepts without codable children: Secondary | ICD-10-CM | POA: Diagnosis not present

## 2013-08-03 DIAGNOSIS — I2510 Atherosclerotic heart disease of native coronary artery without angina pectoris: Secondary | ICD-10-CM | POA: Diagnosis not present

## 2013-08-03 DIAGNOSIS — M7918 Myalgia, other site: Secondary | ICD-10-CM

## 2013-08-03 DIAGNOSIS — R109 Unspecified abdominal pain: Secondary | ICD-10-CM | POA: Diagnosis not present

## 2013-08-03 DIAGNOSIS — K5900 Constipation, unspecified: Secondary | ICD-10-CM

## 2013-08-03 LAB — POCT URINALYSIS DIPSTICK
BILIRUBIN UA: NEGATIVE
Glucose, UA: NEGATIVE
KETONES UA: NEGATIVE
LEUKOCYTES UA: NEGATIVE
Nitrite, UA: NEGATIVE
PROTEIN UA: NEGATIVE
RBC UA: NEGATIVE
Spec Grav, UA: <=1.005
Urobilinogen, UA: >=8
pH, UA: >=9

## 2013-08-03 MED ORDER — MELOXICAM 7.5 MG PO TABS: 7.5000 mg | ORAL_TABLET | Freq: Every day | ORAL | Status: DC

## 2013-08-03 MED ORDER — KETOROLAC TROMETHAMINE 60 MG/2ML IM SOLN: 30.0000 mg | Freq: Once | INTRAMUSCULAR | Status: AC

## 2013-08-03 NOTE — Patient Instructions (Addendum)
Start meloxicam once daily as needed for musculoskeletal pain. Miralax 1 cap in 8 ounce of water today. Repeat tomorrow if no BM. Call if symptoms worsen or if symptoms are not improved in 1 week.

## 2013-08-03 NOTE — Progress Notes (Signed)
Subjective:    Patient ID: [name], female    DOB: Jun 14, 1945, 68 y.o.   MRN: [id number]  HPI  Pt reports right "side pain" started 1 week ago.  Pain has been intermittent.  Pain is worse in certain positions. No alleviating factors. Has tried no otc measures. She does report + constipation.  Plans to pick up colace.  Last full BM was 1 week ago. Had a small bm this week.  She reports + urinary frequency but denies dysuria.     Review of Systems    see HPI  Past Medical History  Diagnosis Date  . Coronary artery disease     a. s/p inferior MI 1998->PCI of RCA. b. S/p prior PCI of the OM1 and LAD;  c. 05/2013 Cath/PCI: LM nl, LAD 90p (3.0x22 Resolute DES), 64m w/ patent stent, Diag 40p, 80m, LCX patent OM1 stent, RCA 50p, 3m, EF 65%.  . Hyperlipidemia   . Hypertension   . Obesity, unspecified   . Hypothyroidism   . Diabetes mellitus     TYPE II  . Anxiety state, unspecified   . Depressive disorder, not elsewhere classified   . Degeneration of lumbar or lumbosacral intervertebral disc   . Allergic rhinitis, cause unspecified   . Lower GI bleed 06/2010    Diverticular bleed  . Chronic LBP   . AAA (abdominal aortic aneurysm)     a. 4.7cm in 08/2012. Followed by [facility].    History   Social History  . Marital Status: Divorced    Spouse Name: N/A    Number of Children: 3  . Years of Education: N/A   Occupational History  . [employer]    ADMIN SUPPORT  .     Social History Main Topics  . Smoking status: Former Smoker -- 30 years    Types: Cigarettes    Quit date: 05/31/1986  . Smokeless tobacco: Never Used  . Alcohol Use: No  . Drug Use: No  . Sexual Activity: Not on file   Other Topics Concern  . Not on file   Social History Narrative   DIVORCED   3 CHILDREN   PATIENT SIGNED A DESIGNATED PARTY RELEASE TO ALLOW HER DAUGHTER, TRAMAINE Goar, TO HAVE ACCESS TO HER MEDICAL RECORDS/INFORMATION. [name], May 04, 2009 @ 3:27 PM   Smokes  cigarettes on rare occasions.    Past Surgical History  Procedure Laterality Date  . Total abdominal hysterectomy    . Lumbar fusion  01/2007    DR. Patrice Paradise...3-LEVEL WITH FIXATION  . Parathyroidectomy    . Thyroidectomy    . Cardiac catheterization      PCI OF BOTH THE CIRCUMFLEX AND LEFT ANTERIOR DESCENDING ARTERY  . Cesarean section    . Heart stent  04-2010  and  Jun 07, 2013    X 3  . Spine surgery      Family History  Problem Relation Age of Onset  . Heart attack Mother 69    s/p D&C-CARDIAC ARREST 1966  . Heart disease Mother   . Heart attack Father 85    1978 WITH MI  . Heart disease Father   . Colon cancer Neg Hx   . Diabetes Brother   . Diabetes Maternal Aunt   . Arthritis Maternal Aunt   . Cancer Maternal Aunt     Allergies  Allergen Reactions  . Doxycycline     REACTION: gi upset  . Miconazole Nitrate  REACTION: hives  . Prilosec [Omeprazole] Other (See Comments)    Chest pain  . Augmentin [Amoxicillin-Pot Clavulanate] Hives, Itching and Rash    Current Outpatient Prescriptions on File Prior to Visit  Medication Sig Dispense Refill  . aspirin EC 81 MG tablet Take 1 tablet (81 mg total) by mouth daily. For 1 month.  Then resume taking 4 times a week (tue thur sat sun)      . atenolol (TENORMIN) 25 MG tablet Take 1 tablet (25 mg total) by mouth 2 (two) times daily.  180 tablet  3  . cetirizine (ZYRTEC) 10 MG chewable tablet Chew 10 mg by mouth daily as needed for allergies.      . hydrOXYzine (ATARAX/VISTARIL) 10 MG tablet Take 1 tablet (10 mg total) by mouth 3 (three) times daily as needed for itching.  90 tablet  1  . levothyroxine (SYNTHROID, LEVOTHROID) 150 MCG tablet Take 1 tablet (150 mcg total) by mouth daily.  90 tablet  3  . metFORMIN (GLUCOPHAGE-XR) 500 MG 24 hr tablet Take 4 tablets (2,000 mg total) by mouth daily with breakfast.      . nitroGLYCERIN (NITROSTAT) 0.4 MG SL tablet Place 1 tablet (0.4 mg total) under the tongue every 5 (five)  minutes as needed.  25 tablet  2  . oxyCODONE (OXY IR/ROXICODONE) 5 MG immediate release tablet 1-2 tabs every 6 hours as needed for pain, with limit 4 tabs per day - to fill Apr 19, 2013  120 tablet  0  . predniSONE (DELTASONE) 10 MG tablet 3 tabs by mouth per day for 3 days, 2tabs per day for 3 days,1tab per day for 3 days  18 tablet  0  . ticagrelor (BRILINTA) 90 MG TABS tablet Take 1 tablet (90 mg total) by mouth 2 (two) times daily.  60 tablet  6  . traMADol (ULTRAM) 50 MG tablet Take 1 tablet (50 mg total) by mouth every 6 (six) hours as needed for pain.  60 tablet  1  . rosuvastatin (CRESTOR) 20 MG tablet Take 1 tablet (20 mg total) by mouth daily.  90 tablet  3   No current facility-administered medications on file prior to visit.    BP 128/74  Pulse 72  Temp(Src) 97.9 F (36.6 C) (Oral)  Wt 229 lb (103.874 kg)  SpO2 98%    Objective:   Physical Exam  Constitutional: She is oriented to person, place, and time. She appears well-developed and well-nourished. No distress.  Cardiovascular: Normal rate and regular rhythm.   No murmur heard. Pulmonary/Chest: Effort normal and breath sounds normal. No respiratory distress. She has no wheezes. She has no rales. She exhibits no tenderness.  Abdominal: Soft. There is no tenderness.  Musculoskeletal:  + tenderness to palpation along right lateral flank area Neg CVAT bilaterally  Neurological: She is alert and oriented to person, place, and time.  Psychiatric: She has a normal mood and affect. Her behavior is normal. Judgment and thought content normal.          Assessment & Plan:

## 2013-08-04 DIAGNOSIS — M7918 Myalgia, other site: Secondary | ICD-10-CM | POA: Insufficient documentation

## 2013-08-04 LAB — URINE CULTURE: Colony Count: 75000

## 2013-08-04 NOTE — Assessment & Plan Note (Signed)
Trial of miralax

## 2013-08-04 NOTE — Assessment & Plan Note (Signed)
Trial of short course of meloxicam.

## 2013-08-05 ENCOUNTER — Telehealth: Payer: Self-pay | Admitting: Family

## 2013-08-05 MED ORDER — CIPROFLOXACIN HCL 500 MG PO TABS: 500.0000 mg | ORAL_TABLET | Freq: Two times a day (BID) | ORAL | Status: DC

## 2013-08-05 NOTE — Telephone Encounter (Signed)
Left message on cell# to return my call. 

## 2013-08-05 NOTE — Telephone Encounter (Signed)
Urine culture shows bacteria. Start cipro.

## 2013-08-05 NOTE — Telephone Encounter (Signed)
Notified pt and she voices understanding. 

## 2013-08-07 ENCOUNTER — Telehealth: Payer: Self-pay | Admitting: Family

## 2013-08-07 ENCOUNTER — Encounter: Payer: Self-pay | Admitting: *Deleted

## 2013-08-07 ENCOUNTER — Encounter: Payer: Medicare Other | Attending: Internal Medicine | Admitting: *Deleted

## 2013-08-07 VITALS — Ht 66.0 in | Wt 228.5 lb

## 2013-08-07 DIAGNOSIS — E119 Type 2 diabetes mellitus without complications: Secondary | ICD-10-CM | POA: Insufficient documentation

## 2013-08-07 DIAGNOSIS — Z713 Dietary counseling and surveillance: Secondary | ICD-10-CM | POA: Diagnosis not present

## 2013-08-07 NOTE — Progress Notes (Signed)
Appt start time: 1400 end time:  1530.  Assessment:  Patient was seen on  08/07/13 for individual diabetes education. Patient was in hospital for a stent in May and Diabetes Education was suggested. She lives alone with youngest daughter living with her occasionally. She states she has borderline diabetes, she does take Metformin, she does not have a meter to check her BG. Retired from Careers information officer at [employer]. She enjoys playing games on her tablet and going out with her friends.   Patient Education Plan per assessed needs and concerns is to attend individual session for Diabetes Self Management Education.  Current HbA1c: current not known, last A1c was 7.1% on 01/08/2013  Preferred Learning Style:   No preference indicated   Learning Readiness:   Contemplating  MEDICATIONS: see list, diabetes medication is Metformin  DIETARY INTAKE:  24-hr recall:  B (10 AM): skips occasionally, maybe a bagel and cream cheese OR applesauce and scrambled egg, coffee with cream and 2 tsp sugar  Snk ( AM): not usually unless fresh fruit L ( PM): skips if ate breakfast Snk ( PM): fresh fruit D ( PM): meat, starch, other left overs, sweet tea from [employer] Snk ( PM):  Beverages: coffee, sweet tea, water  Usual physical activity: not now  Estimated energy needs: 1400 calories 158 g carbohydrates 105 g protein 39 g fat  Progress Towards Goal(s):  Some progress.   Nutritional Diagnosis:  NB-1.1 Food and nutrition-related knowledge deficit As related to Diabetes.  As evidenced by A1c of 7.1%.    Intervention:  Nutrition counseling provided.  Discussed diabetes disease process and treatment options.  Discussed physiology of diabetes and role of obesity on insulin resistance.  Encouraged moderate weight reduction to improve glucose levels.   Provided basic education on macronutrients on glucose levels.  Provided education on carb counting, importance of regularly scheduled meals/snacks,  and meal planning. Informed her of high carb content of sweet tea and options to mix with unsweet to help with BG control  Discussed effects of physical activity on glucose levels and long-term glucose control.  Recommended she consider options for increasing her physical activity/week.  Reviewed patient medications.  Discussed role of medication on blood glucose and possible side effects  Did not discuss blood glucose monitoring and interpretation today.  Provided information on:  Short-term complications: hyper- and hypo-glycemia.  Discussed causes,symptoms, and treatment options.  Prevention, detection, and treatment of long-term complications.  Discussed the role of prolonged elevated glucose levels on body systems.  Role of stress on blood glucose levels and discussed strategies to manage psychosocial issues.  Recommendations for long-term diabetes self-care.  Established checklist for medical, dental, and emotional self-care.  Plan:  Use Plate Method to limit Starch and Meat to 1/4 of your plate and Vegetables 1/2 of plate  Include protein in moderation with your meals and snacks Consider reading food labels for Total Carbohydrate of foods Consider  increasing your activity level daily as tolerated  Teaching Method Utilized: Visual, Auditory AND Hands on  Handouts given during visit include: Living Well with Diabetes Carb Counting and Food Label handouts Meal Plan Card  Barriers to learning/adherence to lifestyle change: Very limited activity level  Diabetes self-care support plan:   [facility] support group  Demonstrated degree of understanding via:  Teach Back   Monitoring/Evaluation:  Dietary intake, exercise, limit sweet beverages, and body weight prn. Patient did not make appointment today, has my card for future use as needed.

## 2013-08-07 NOTE — Telephone Encounter (Signed)
Patient states that she is not feeling any better since last visit and would like more antibiotic called in.

## 2013-08-08 ENCOUNTER — Ambulatory Visit (INDEPENDENT_AMBULATORY_CARE_PROVIDER_SITE_OTHER): Payer: Medicare Other | Admitting: Family Medicine

## 2013-08-08 ENCOUNTER — Encounter: Payer: Self-pay | Admitting: Family Medicine

## 2013-08-08 VITALS — BP 100/72 | HR 71 | Temp 98.0°F | Ht 66.0 in | Wt 230.5 lb

## 2013-08-08 DIAGNOSIS — I2510 Atherosclerotic heart disease of native coronary artery without angina pectoris: Secondary | ICD-10-CM

## 2013-08-08 DIAGNOSIS — R300 Dysuria: Secondary | ICD-10-CM | POA: Diagnosis not present

## 2013-08-08 LAB — POCT URINALYSIS DIPSTICK
BILIRUBIN UA: NEGATIVE
Blood, UA: NEGATIVE
GLUCOSE UA: NEGATIVE
Ketones, UA: NEGATIVE
NITRITE UA: NEGATIVE
Protein, UA: NEGATIVE
Spec Grav, UA: 1.015
UROBILINOGEN UA: 0.2
pH, UA: 6

## 2013-08-08 MED ORDER — NITROFURANTOIN MONOHYD MACRO 100 MG PO CAPS: 100.0000 mg | ORAL_CAPSULE | Freq: Two times a day (BID) | ORAL | Status: DC

## 2013-08-08 NOTE — Addendum Note (Signed)
Addended by: Agnes Lawrence on: 08/08/2013 10:45 AM   Modules accepted: Orders

## 2013-08-08 NOTE — Telephone Encounter (Signed)
Patient will need to be re-evaluated in the office.  Could you please schedule her a follow up? 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

## 2013-08-08 NOTE — Progress Notes (Signed)
No chief complaint on file.   HPI:  Acute visit for:  1) Dysuria: -dysuria, low back pain for 1 week -saw Dr. Inda [name] last week and treated for constipation as urine dip ok and culture < 100,000 col and multiple morphotypes - she reports the called her monday they did give her cipro for 3 days -she reports: she took the cipro which did help, but still having some urinary frequency and a little R low back pain -denies: fevers, vaginal symptoms, fever, blood in urine, constipation, diarrhea, blood in stools, abd pain, pelvic pain -hx of chronic back pain -reports hx recurrent UTIs and on ROC frequent visit chronically for the same -SHE IS VERY UPSET THAT SHE HAD TO COME HERE TODAY - reports Dr. Jenny [name] knows her and sends in longer cours eof antibiotic over the phone and this is what she wanted and is angry that when she called Dr. Rowe [name] office they made her come all the way over here and pay copay for office visit when all she wants is more antibiotic ROS: See pertinent positives and negatives per HPI.  Past Medical History  Diagnosis Date  . Coronary artery disease     a. s/p inferior MI 1998->PCI of RCA. b. S/p prior PCI of the OM1 and LAD;  c. 05/2013 Cath/PCI: LM nl, LAD 90p (3.0x22 Resolute DES), 62m w/ patent stent, Diag 40p, 38m, LCX patent OM1 stent, RCA 50p, 28m, EF 65%.  . Hyperlipidemia   . Hypertension   . Obesity, unspecified   . Hypothyroidism   . Diabetes mellitus     TYPE II  . Anxiety state, unspecified   . Depressive disorder, not elsewhere classified   . Degeneration of lumbar or lumbosacral intervertebral disc   . Allergic rhinitis, cause unspecified   . Lower GI bleed 06/2010    Diverticular bleed  . Chronic LBP   . AAA (abdominal aortic aneurysm)     a. 4.7cm in 08/2012. Followed by [facility].    Past Surgical History  Procedure Laterality Date  . Total abdominal hysterectomy    . Lumbar fusion  01/2007    DR. Patrice [name]...3-LEVEL WITH FIXATION  .  Parathyroidectomy    . Thyroidectomy    . Cardiac catheterization      PCI OF BOTH THE CIRCUMFLEX AND LEFT ANTERIOR DESCENDING ARTERY  . Cesarean section    . Heart stent  04-2010  and  Jun 07, 2013    X 3  . Spine surgery      Family History  Problem Relation Age of Onset  . Heart attack Mother 44    s/p D&C-CARDIAC ARREST 1966  . Heart disease Mother   . Heart attack Father 48    1978 WITH MI  . Heart disease Father   . Colon cancer Neg Hx   . Diabetes Brother   . Diabetes Maternal Aunt   . Arthritis Maternal Aunt   . Cancer Maternal Aunt     History   Social History  . Marital Status: Divorced    Spouse Name: N/A    Number of Children: 3  . Years of Education: N/A   Occupational History  . 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     Social History Main Topics  . Smoking status: Former Smoker -- 30 years    Types: Cigarettes    Quit date: 05/31/1986  . Smokeless tobacco: Never Used  . Alcohol Use: No  . Drug Use: No  .  Sexual Activity: None   Other Topics Concern  . None   Social History Narrative   DIVORCED   3 CHILDREN   PATIENT SIGNED A DESIGNATED PARTY RELEASE TO ALLOW HER DAUGHTER, [name], TO HAVE ACCESS TO HER MEDICAL RECORDS/INFORMATION. [name], May 04, 2009 @ 3:27 PM   Smokes cigarettes on rare occasions.    Current outpatient prescriptions:aspirin EC 81 MG tablet, Take 1 tablet (81 mg total) by mouth daily. For 1 month.  Then resume taking 4 times a week (tue thur sat sun), Disp: , Rfl: ;  atenolol (TENORMIN) 25 MG tablet, Take 1 tablet (25 mg total) by mouth 2 (two) times daily., Disp: 180 tablet, Rfl: 3;  cetirizine (ZYRTEC) 10 MG chewable tablet, Chew 10 mg by mouth daily as needed for allergies., Disp: , Rfl:  hydrOXYzine (ATARAX/VISTARIL) 10 MG tablet, Take 1 tablet (10 mg total) by mouth 3 (three) times daily as needed for itching., Disp: 90 tablet, Rfl: 1;  levothyroxine (SYNTHROID, LEVOTHROID) 150 MCG tablet, Take 1  tablet (150 mcg total) by mouth daily., Disp: 90 tablet, Rfl: 3;  meloxicam (MOBIC) 7.5 MG tablet, Take 1 tablet (7.5 mg total) by mouth daily., Disp: 7 tablet, Rfl: 0 metFORMIN (GLUCOPHAGE-XR) 500 MG 24 hr tablet, Take 4 tablets (2,000 mg total) by mouth daily with breakfast., Disp: , Rfl: ;  nitroGLYCERIN (NITROSTAT) 0.4 MG SL tablet, Place 1 tablet (0.4 mg total) under the tongue every 5 (five) minutes as needed., Disp: 25 tablet, Rfl: 2 oxyCODONE (OXY IR/ROXICODONE) 5 MG immediate release tablet, 1-2 tabs every 6 hours as needed for pain, with limit 4 tabs per day - to fill Apr 19, 2013, Disp: 120 tablet, Rfl: 0;  ticagrelor (BRILINTA) 90 MG TABS tablet, Take 1 tablet (90 mg total) by mouth 2 (two) times daily., Disp: 60 tablet, Rfl: 6;  traMADol (ULTRAM) 50 MG tablet, Take 1 tablet (50 mg total) by mouth every 6 (six) hours as needed for pain., Disp: 60 tablet, Rfl: 1 nitrofurantoin, macrocrystal-monohydrate, (MACROBID) 100 MG capsule, Take 1 capsule (100 mg total) by mouth 2 (two) times daily., Disp: 14 capsule, Rfl: 0;  rosuvastatin (CRESTOR) 20 MG tablet, Take 1 tablet (20 mg total) by mouth daily., Disp: 90 tablet, Rfl: 3 Current facility-administered medications:ketorolac (TORADOL) injection 30 mg, 30 mg, Intramuscular, Once, Brandi Alar, NP  EXAM:  Filed Vitals:   08/08/13 1010  BP: 100/72  Pulse: 71  Temp: 98 F (36.7 C)    Body mass index is 37.22 kg/(m^2).  GENERAL: vitals reviewed and listed above, alert, oriented, appears well hydrated and in no acute distress  HEENT: atraumatic, conjunttiva clear, no obvious abnormalities on inspection of external nose and ears  NECK: no obvious masses on inspection  LUNGS: clear to auscultation bilaterally, no wheezes, rales or rhonchi, good air movement  CV: HRRR, no peripheral edema  MS: moves all extremities without noticeable abnormality, TTP in R LD muscle  PSYCH: pleasant and cooperative, no obvious depression or  anxiety  ASSESSMENT AND PLAN:  Discussed the following assessment and plan:  Dysuria - Plan: nitrofurantoin, macrocrystal-monohydrate, (MACROBID) 100 MG capsule  -we discussed possible serious and likely etiologies, workup and treatment, treatment risks and return precautions -after this discussion, [name] opted for macrobid for 7 days incase resistance to cipro, repeat culture, tylenol for back pain that I suspect is unrelated given exam and is muscular -follow up advised with PCP if further concerns or symptoms persist -of course, we advised [name]  to return or  notify a doctor immediately if symptoms worsen or persist or new concerns arise.  -Patient advised to return or notify a doctor immediately if symptoms worsen or persist or new concerns arise.  There are no Patient Instructions on file for this visit.   Colin Benton R.

## 2013-08-08 NOTE — Progress Notes (Signed)
Pre visit review using our clinic review tool, if applicable. No additional management support is needed unless otherwise documented below in the visit note. 

## 2013-08-08 NOTE — Patient Instructions (Signed)
-  Azo over the counter  -tylenol 500-1000mg  up to 2 times daily if needed for pain  -start antibiotic  -culture pending

## 2013-08-08 NOTE — Telephone Encounter (Signed)
No availability at [facility], patient will see Dr. Maudie Mercury at [place] this morning.

## 2013-08-09 ENCOUNTER — Other Ambulatory Visit: Payer: Self-pay | Admitting: Family

## 2013-08-10 DIAGNOSIS — I10 Essential (primary) hypertension: Secondary | ICD-10-CM | POA: Diagnosis not present

## 2013-08-10 DIAGNOSIS — Z9861 Coronary angioplasty status: Secondary | ICD-10-CM | POA: Diagnosis not present

## 2013-08-10 DIAGNOSIS — E079 Disorder of thyroid, unspecified: Secondary | ICD-10-CM | POA: Diagnosis not present

## 2013-08-10 DIAGNOSIS — M546 Pain in thoracic spine: Secondary | ICD-10-CM | POA: Diagnosis not present

## 2013-08-10 DIAGNOSIS — Z79899 Other long term (current) drug therapy: Secondary | ICD-10-CM | POA: Diagnosis not present

## 2013-08-10 DIAGNOSIS — S239XXA Sprain of unspecified parts of thorax, initial encounter: Secondary | ICD-10-CM | POA: Diagnosis not present

## 2013-08-10 DIAGNOSIS — R109 Unspecified abdominal pain: Secondary | ICD-10-CM | POA: Diagnosis not present

## 2013-08-10 DIAGNOSIS — K7689 Other specified diseases of liver: Secondary | ICD-10-CM | POA: Diagnosis not present

## 2013-08-10 DIAGNOSIS — I714 Abdominal aortic aneurysm, without rupture, unspecified: Secondary | ICD-10-CM | POA: Diagnosis not present

## 2013-08-10 DIAGNOSIS — E119 Type 2 diabetes mellitus without complications: Secondary | ICD-10-CM | POA: Diagnosis not present

## 2013-08-10 DIAGNOSIS — IMO0002 Reserved for concepts with insufficient information to code with codable children: Secondary | ICD-10-CM | POA: Diagnosis not present

## 2013-08-10 DIAGNOSIS — Z7982 Long term (current) use of aspirin: Secondary | ICD-10-CM | POA: Diagnosis not present

## 2013-08-10 DIAGNOSIS — M549 Dorsalgia, unspecified: Secondary | ICD-10-CM | POA: Diagnosis not present

## 2013-08-11 LAB — CULTURE, URINE COMPREHENSIVE: Colony Count: 15000

## 2013-08-13 ENCOUNTER — Telehealth: Payer: Self-pay | Admitting: Internal Medicine

## 2013-08-13 ENCOUNTER — Encounter: Payer: Self-pay | Admitting: Internal Medicine

## 2013-08-13 ENCOUNTER — Ambulatory Visit (INDEPENDENT_AMBULATORY_CARE_PROVIDER_SITE_OTHER): Payer: Medicare Other | Admitting: Internal Medicine

## 2013-08-13 VITALS — BP 108/62 | HR 80 | Temp 98.3°F | Ht 66.0 in | Wt 225.5 lb

## 2013-08-13 DIAGNOSIS — I10 Essential (primary) hypertension: Secondary | ICD-10-CM

## 2013-08-13 DIAGNOSIS — I2510 Atherosclerotic heart disease of native coronary artery without angina pectoris: Secondary | ICD-10-CM | POA: Diagnosis not present

## 2013-08-13 DIAGNOSIS — F329 Major depressive disorder, single episode, unspecified: Secondary | ICD-10-CM

## 2013-08-13 DIAGNOSIS — R109 Unspecified abdominal pain: Secondary | ICD-10-CM | POA: Diagnosis not present

## 2013-08-13 DIAGNOSIS — F3289 Other specified depressive episodes: Secondary | ICD-10-CM

## 2013-08-13 MED ORDER — KETOROLAC TROMETHAMINE 30 MG/ML IJ SOLN
30.0000 mg | Freq: Once | INTRAMUSCULAR | Status: AC
  Administered 2013-08-13: 30 mg via INTRAMUSCULAR

## 2013-08-13 MED ORDER — TIZANIDINE HCL 4 MG PO TABS: 4.0000 mg | ORAL_TABLET | Freq: Four times a day (QID) | ORAL | Status: DC | PRN

## 2013-08-13 NOTE — Patient Instructions (Signed)
You had the pain shot today  Please take all new medication as prescribed - the muscle relaxer as needed  OK to continue the tramadol for pain as well  You will be contacted regarding the referral for: abdomen ultrasound  Please continue all other medications as before, and refills have been done if requested.  Please have the pharmacy call with any other refills you may need.  Please keep your appointments with your specialists as you may have planned

## 2013-08-13 NOTE — Progress Notes (Signed)
Subjective:    Patient ID: [name], female    DOB: 08/13/1945, 68 y.o.   MRN: [id number]  HPI here with 3 wks persistent severe right lower back pain/right side pain, points to area below the right flank., sharp, severe, worse to sleep on left side but right side is ok, worse to move such as bending and twisting.  No bowel or bladder change, fever, wt loss,  worsening LE pain/numbness/weakness, gait change or falls. Better at [facility] ER visit recently with toradol IM.  Cannot recall any specific activity to cause msk pain. July 23 UA dip neg., cx with 15K colony only, tx with macrobid, no help with current pain. Had MRI lumbar done last Saturday as she was at [facility] results not available now, except pt recalls she was told her AAA was 4.5 cm. Denies worsening depressive symptoms, suicidal ideation, or panic; has ongoing anxiety,  Past Medical History  Diagnosis Date  . Coronary artery disease     a. s/p inferior MI 1998->PCI of RCA. b. S/p prior PCI of the OM1 and LAD;  c. 05/2013 Cath/PCI: LM nl, LAD 90p (3.0x22 Resolute DES), 59m w/ patent stent, Diag 40p, 50m, LCX patent OM1 stent, RCA 50p, 47m, EF 65%.  . Hyperlipidemia   . Hypertension   . Obesity, unspecified   . Hypothyroidism   . Diabetes mellitus     TYPE II  . Anxiety state, unspecified   . Depressive disorder, not elsewhere classified   . Degeneration of lumbar or lumbosacral intervertebral disc   . Allergic rhinitis, cause unspecified   . Lower GI bleed 06/2010    Diverticular bleed  . Chronic LBP   . AAA (abdominal aortic aneurysm)     a. 4.7cm in 08/2012. Followed by [facility].   Past Surgical History  Procedure Laterality Date  . Total abdominal hysterectomy    . Lumbar fusion  01/2007    DR. Patrice Paradise...3-LEVEL WITH FIXATION  . Parathyroidectomy    . Thyroidectomy    . Cardiac catheterization      PCI OF BOTH THE CIRCUMFLEX AND LEFT ANTERIOR DESCENDING ARTERY  . Cesarean section    . Heart stent  04-2010  and  Jun 07, 2013    X 3  . Spine surgery      reports that she quit smoking about 27 years ago. Her smoking use included Cigarettes. She smoked 0.00 packs per day for 30 years. She has never used smokeless tobacco. She reports that she does not drink alcohol or use illicit drugs. family history includes Arthritis in her maternal aunt; Cancer in her maternal aunt; Diabetes in her brother and maternal aunt; Heart attack (age of onset: 29) in her mother; Heart attack (age of onset: 78) in her father; Heart disease in her father and mother. There is no history of Colon cancer. Allergies  Allergen Reactions  . Doxycycline     REACTION: gi upset  . Miconazole Nitrate     REACTION: hives  . Prilosec [Omeprazole] Other (See Comments)    Chest pain  . Augmentin [Amoxicillin-Pot Clavulanate] Hives, Itching and Rash   Current Outpatient Prescriptions on File Prior to Visit  Medication Sig Dispense Refill  . aspirin EC 81 MG tablet Take 1 tablet (81 mg total) by mouth daily. For 1 month.  Then resume taking 4 times a week (tue thur sat sun)      . atenolol (TENORMIN) 25 MG tablet Take 1 tablet (25 mg total) by mouth  2 (two) times daily.  180 tablet  3  . cetirizine (ZYRTEC) 10 MG chewable tablet Chew 10 mg by mouth daily as needed for allergies.      . hydrOXYzine (ATARAX/VISTARIL) 10 MG tablet Take 1 tablet (10 mg total) by mouth 3 (three) times daily as needed for itching.  90 tablet  1  . levothyroxine (SYNTHROID, LEVOTHROID) 150 MCG tablet Take 1 tablet (150 mcg total) by mouth daily.  90 tablet  3  . meloxicam (MOBIC) 7.5 MG tablet Take 1 tablet (7.5 mg total) by mouth daily.  7 tablet  0  . metFORMIN (GLUCOPHAGE-XR) 500 MG 24 hr tablet Take 4 tablets (2,000 mg total) by mouth daily with breakfast.      . nitrofurantoin, macrocrystal-monohydrate, (MACROBID) 100 MG capsule Take 1 capsule (100 mg total) by mouth 2 (two) times daily.  14 capsule  0  . nitroGLYCERIN (NITROSTAT) 0.4 MG SL tablet Place 1 tablet  (0.4 mg total) under the tongue every 5 (five) minutes as needed.  25 tablet  2  . oxyCODONE (OXY IR/ROXICODONE) 5 MG immediate release tablet 1-2 tabs every 6 hours as needed for pain, with limit 4 tabs per day - to fill Apr 19, 2013  120 tablet  0  . ticagrelor (BRILINTA) 90 MG TABS tablet Take 1 tablet (90 mg total) by mouth 2 (two) times daily.  60 tablet  6  . traMADol (ULTRAM) 50 MG tablet Take 1 tablet (50 mg total) by mouth every 6 (six) hours as needed for pain.  60 tablet  1  . rosuvastatin (CRESTOR) 20 MG tablet Take 1 tablet (20 mg total) by mouth daily.  90 tablet  3   No current facility-administered medications on file prior to visit.    Review of Systems  Constitutional: Negative for unusual diaphoresis or other sweats  HENT: Negative for ringing in ear Eyes: Negative for double vision or worsening visual disturbance.  Respiratory: Negative for choking and stridor.   Gastrointestinal: Negative for vomiting or other signifcant bowel change Genitourinary: Negative for hematuria or decreased urine volume.  Musculoskeletal: Negative for other MSK pain or swelling Skin: Negative for color change and worsening wound.  Neurological: Negative for tremors and numbness other than noted  Psychiatric/Behavioral: Negative for decreased concentration or agitation other than above   Has lost 9 lbs unintentionally - not clear why    Objective:   Physical Exam BP 108/62  Pulse 80  Temp(Src) 98.3 F (36.8 C) (Oral)  Ht 5\' 6"  (1.676 m)  Wt 225 lb 8 oz (102.286 kg)  BMI 36.41 kg/m2  SpO2 97% VS noted,  Constitutional: Pt appears well-developed, well-nourished.  HENT: Head: NCAT.  Right Ear: External ear normal.  Left Ear: External ear normal.  Eyes: . Pupils are equal, round, and reactive to light. Conjunctivae and EOM are normal Neck: Normal range of motion. Neck supple.  Cardiovascular: Normal rate and regular rhythm.   Pulmonary/Chest: Effort normal and breath sounds normal.    Abd:  Soft, NT, ND, + BS, no flank tender Has marked point tenderness to right mid axillary line at approx t9, no obvious msk abnormality, skin change or swelling Neurological: Pt is alert. Not confused , motor grossly intact Skin: Skin is warm. No rash Psychiatric: Pt behavior is normal. No agitation. not depressed affect    Assessment & Plan:

## 2013-08-13 NOTE — Progress Notes (Signed)
Pre visit review using our clinic review tool, if applicable. No additional management support is needed unless otherwise documented below in the visit note. 

## 2013-08-13 NOTE — Telephone Encounter (Signed)
I called the pt back and she stated she was seen by per PCP and given an injection.

## 2013-08-13 NOTE — Assessment & Plan Note (Signed)
Located right mid lateral at the mid axillary line with marked point tender, but no skin change, states "feels puffy" but I am unable to apprec due to her obesity; ? msk vs other, recent Er labs and MRI lumbar apparently unrevealing, will need to try to get results; today for toradol IM x 1, cont mobic, add tizanidine prn, check abd u/s - r/o liver/renal issue

## 2013-08-13 NOTE — Telephone Encounter (Signed)
Pt returning your call

## 2013-08-14 ENCOUNTER — Encounter: Payer: Self-pay | Admitting: Cardiology

## 2013-08-14 NOTE — Patient Instructions (Signed)
Plan:  Use Plate Method to limit Starch and Meat to 1/4 of your plate and Vegetables 1/2 of plate  Include protein in moderation with your meals and snacks Consider reading food labels for Total Carbohydrate of foods Consider  increasing your activity level daily as tolerated

## 2013-08-16 ENCOUNTER — Telehealth: Payer: Self-pay | Admitting: Internal Medicine

## 2013-08-16 ENCOUNTER — Telehealth: Payer: Self-pay | Admitting: *Deleted

## 2013-08-16 ENCOUNTER — Other Ambulatory Visit: Payer: Self-pay | Admitting: *Deleted

## 2013-08-16 DIAGNOSIS — I10 Essential (primary) hypertension: Secondary | ICD-10-CM

## 2013-08-16 DIAGNOSIS — R079 Chest pain, unspecified: Secondary | ICD-10-CM

## 2013-08-16 DIAGNOSIS — K922 Gastrointestinal hemorrhage, unspecified: Secondary | ICD-10-CM

## 2013-08-16 DIAGNOSIS — E785 Hyperlipidemia, unspecified: Secondary | ICD-10-CM

## 2013-08-16 MED ORDER — NITROGLYCERIN 0.4 MG SL SUBL: 0.4000 mg | SUBLINGUAL_TABLET | SUBLINGUAL | Status: DC | PRN

## 2013-08-16 NOTE — Telephone Encounter (Signed)
I tried to call patient about research lab work drawn on 08/14/13 for the [facility]el[facility] Study. Labs were reviewed by Dr.Stuckey. Patient has uric acid level of 10.1mg /dl and should follow-up with primary doctor Dr.Johns. LDL is elevated at 128 and should follow-up with Dr.Cooper for elevated LDL. Pt is presently on Crestor 20 mg qd. Labs were scanned into Epic for review. I have been unable to reach patient and left message for her to call me.

## 2013-08-16 NOTE — Telephone Encounter (Signed)
Patient is currently at pharmacy.  States Dr. [name] was suppose to call in script for nitroglycerin and it was never called in.  Her pharmacy is [facility] on [address].  Did inform patient this could take awhile to probably check back with pharmacy this afternoon.  She stated she would stay at pharmacy for a few minutes to see if script comes in.

## 2013-08-16 NOTE — Telephone Encounter (Signed)
Sent rx to [facility] 

## 2013-08-17 NOTE — Assessment & Plan Note (Signed)
stable overall by history and exam, recent data reviewed with pt, and pt to continue medical treatment as before,  to f/u any worsening symptoms or concerns Lab Results  Component Value Date   WBC 5.2 06/08/2013   HGB 13.5 06/08/2013   HCT 40.5 06/08/2013   PLT 239 06/08/2013   GLUCOSE 134* 06/08/2013   CHOL 158 06/07/2013   TRIG 235* 06/07/2013   HDL 25* 06/07/2013   LDLDIRECT 73.1 02/27/2012   LDLCALC 86 06/07/2013   ALT 32 01/08/2013   AST 32 01/08/2013   NA 140 06/08/2013   K 3.9 06/08/2013   CL 104 06/08/2013   CREATININE 0.81 06/08/2013   BUN 8 06/08/2013   CO2 24 06/08/2013   TSH 1.920 06/06/2013   INR 1.05 06/06/2013   HGBA1C 7.1* 01/08/2013   MICROALBUR 0.6 01/08/2013

## 2013-08-17 NOTE — Assessment & Plan Note (Signed)
stable overall by history and exam, recent data reviewed with pt, and pt to continue medical treatment as before,  to f/u any worsening symptoms or concerns BP Readings from Last 3 Encounters:  08/13/13 108/62  08/08/13 100/72  08/03/13 128/74

## 2013-08-19 ENCOUNTER — Telehealth: Payer: Self-pay | Admitting: *Deleted

## 2013-08-19 ENCOUNTER — Telehealth: Payer: Self-pay | Admitting: Internal Medicine

## 2013-08-19 MED ORDER — ALLOPURINOL 300 MG PO TABS: 300.0000 mg | ORAL_TABLET | Freq: Every day | ORAL | Status: DC

## 2013-08-19 NOTE — Addendum Note (Signed)
Addended by: Biagio Borg on: 08/19/2013 07:52 PM   Modules accepted: Orders

## 2013-08-19 NOTE — Telephone Encounter (Signed)
[place] for allopurinol 300 qd- done erx

## 2013-08-19 NOTE — Telephone Encounter (Signed)
Relevant patient education assigned to patient using Emmi. ° °

## 2013-08-19 NOTE — Telephone Encounter (Signed)
Patient called me back about [facility]el[facility] lab work. I told her I sent Dr.John labs on elevated uric acid.of 10.1mg /dl. I sent Dr. Burt Knack on LDL elevated at 128mg /dl. Patient verbalized understanding.

## 2013-08-20 NOTE — Telephone Encounter (Signed)
Patient informed script sent into pharmacy

## 2013-08-22 ENCOUNTER — Ambulatory Visit
Admission: RE | Admit: 2013-08-22 | Discharge: 2013-08-22 | Disposition: A | Discharge status: Home / Self Care | Payer: Medicare Other | Source: Ambulatory Visit | Attending: Internal Medicine | Admitting: Internal Medicine

## 2013-08-22 ENCOUNTER — Other Ambulatory Visit (INDEPENDENT_AMBULATORY_CARE_PROVIDER_SITE_OTHER): Payer: Medicare Other

## 2013-08-22 ENCOUNTER — Ambulatory Visit (INDEPENDENT_AMBULATORY_CARE_PROVIDER_SITE_OTHER): Payer: Medicare Other | Admitting: Internal Medicine

## 2013-08-22 ENCOUNTER — Encounter: Payer: Self-pay | Admitting: Internal Medicine

## 2013-08-22 VITALS — BP 114/70 | HR 107 | Temp 98.1°F | Wt 230.5 lb

## 2013-08-22 DIAGNOSIS — I714 Abdominal aortic aneurysm, without rupture, unspecified: Secondary | ICD-10-CM | POA: Diagnosis not present

## 2013-08-22 DIAGNOSIS — I2510 Atherosclerotic heart disease of native coronary artery without angina pectoris: Secondary | ICD-10-CM | POA: Diagnosis not present

## 2013-08-22 DIAGNOSIS — E119 Type 2 diabetes mellitus without complications: Secondary | ICD-10-CM

## 2013-08-22 DIAGNOSIS — E785 Hyperlipidemia, unspecified: Secondary | ICD-10-CM | POA: Diagnosis not present

## 2013-08-22 DIAGNOSIS — R109 Unspecified abdominal pain: Secondary | ICD-10-CM | POA: Diagnosis not present

## 2013-08-22 DIAGNOSIS — I10 Essential (primary) hypertension: Secondary | ICD-10-CM | POA: Diagnosis not present

## 2013-08-22 DIAGNOSIS — K7689 Other specified diseases of liver: Secondary | ICD-10-CM | POA: Diagnosis not present

## 2013-08-22 DIAGNOSIS — M1040 Other secondary gout, unspecified site: Secondary | ICD-10-CM

## 2013-08-22 DIAGNOSIS — M109 Gout, unspecified: Secondary | ICD-10-CM | POA: Insufficient documentation

## 2013-08-22 HISTORY — DX: Gout, unspecified: M10.9

## 2013-08-22 LAB — HEPATIC FUNCTION PANEL
ALT: 35 U/L (ref 0–35)
AST: 38 U/L — ABNORMAL HIGH (ref 0–37)
Albumin: 3.7 g/dL (ref 3.5–5.2)
Alkaline Phosphatase: 76 U/L (ref 39–117)
BILIRUBIN DIRECT: 0 mg/dL (ref 0.0–0.3)
BILIRUBIN TOTAL: 0.6 mg/dL (ref 0.2–1.2)
Total Protein: 7.7 g/dL (ref 6.0–8.3)

## 2013-08-22 LAB — LIPID PANEL
CHOL/HDL RATIO: 6
Cholesterol: 184 mg/dL (ref 0–200)
HDL: 32 mg/dL — AB (ref >39.00)
LDL CALC: 118 mg/dL — AB (ref 0–99)
NONHDL: 152
Triglycerides: 170 mg/dL — ABNORMAL HIGH (ref 0.0–149.0)
VLDL: 34 mg/dL (ref 0.0–40.0)

## 2013-08-22 LAB — BASIC METABOLIC PANEL
BUN: 13 mg/dL (ref 6–23)
CHLORIDE: 102 meq/L (ref 96–112)
CO2: 28 mEq/L (ref 19–32)
CREATININE: 1 mg/dL (ref 0.4–1.2)
Calcium: 9.4 mg/dL (ref 8.4–10.5)
GFR: 72.58 mL/min (ref >60.00)
Glucose, Bld: 95 mg/dL (ref 70–99)
Potassium: 4 mEq/L (ref 3.5–5.1)
Sodium: 140 mEq/L (ref 135–145)

## 2013-08-22 LAB — HEMOGLOBIN A1C: Hgb A1c MFr Bld: 7 % — ABNORMAL HIGH (ref 4.6–6.5)

## 2013-08-22 MED ORDER — KETOROLAC TROMETHAMINE 30 MG/ML IJ SOLN
30.0000 mg | Freq: Once | INTRAMUSCULAR | Status: AC
  Administered 2013-08-22: 30 mg via INTRAMUSCULAR

## 2013-08-22 MED ORDER — GABAPENTIN 300 MG PO CAPS: 300.0000 mg | ORAL_CAPSULE | Freq: Three times a day (TID) | ORAL | Status: DC

## 2013-08-22 NOTE — Patient Instructions (Signed)
You had the pain shot today (toradol)  Please take all new medication as prescribed  - the gabapentin 300 mg; to start this , take 1 pill at night for 3 nights, then 1 pill twice per day for 3 days, then 1 pill three times per day after that, to avoid sleepiness  OK to start the crestor, and the allopurinol  Please continue all other medications as before  Please have the pharmacy call with any other refills you may need.  Please continue your efforts at being more active, low cholesterol diet, and weight control.  You are otherwise up to date with prevention measures today.  Please keep your appointments with your specialists as you may have planned  Please go to the LAB in the Basement (turn left off the elevator) for the tests to be done today  You will be contacted by phone if any changes need to be made immediately.  Otherwise, you will receive a letter about your results with an explanation, but please check with MyChart first.  Please remember to sign up for MyChart if you have not done so, as this will be important to you in the future with finding out test results, communicating by private email, and scheduling acute appointments online when needed.  Please return in 6 months, or sooner if needed

## 2013-08-22 NOTE — Assessment & Plan Note (Signed)
stable overall by history and exam, recent data reviewed with pt, and pt to continue medical treatment as before,  to f/u any worsening symptoms or concerns Lab Results  Component Value Date   HGBA1C 7.0* 08/22/2013

## 2013-08-22 NOTE — Progress Notes (Signed)
Pre visit review using our clinic review tool, if applicable. No additional management support is needed unless otherwise documented below in the visit note. 

## 2013-08-22 NOTE — Assessment & Plan Note (Signed)
Recent u/s neg, CT neg for acute, labs not helpful, ? Neuritic; for trial gabapentin asd, consider pain clinic referral for nerve ablation if persists

## 2013-08-22 NOTE — Assessment & Plan Note (Signed)
stable overall by history and exam, recent data reviewed with pt, and pt to continue medical treatment as before,  to f/u any worsening symptoms or concerns Lab Results  Component Value Date   LDLCALC 118* 08/22/2013

## 2013-08-22 NOTE — Assessment & Plan Note (Signed)
Ok to start the allopurinol asd

## 2013-08-22 NOTE — Progress Notes (Signed)
Subjective:    Patient ID: [name], female    DOB: 15-Jun-1945, 68 y.o.   MRN: [id number]  HPI  Here to f/u, unfortunately no change in right side pain as per last visit, still mod to severe, asks for even temporary relief with toradol again today. No recent gouty attack but uric acid recently elev > 10, not yet started allopurinol as thought she might get hooked on it somehow.  Not yet started crestor as well or takes rarely, not clear why.  Pt denies chest pain, increased sob or doe, wheezing, orthopnea, PND, increased LE swelling, palpitations, dizziness or syncope.   Pt denies polydipsia, polyuria,  Past Medical History  Diagnosis Date  . Coronary artery disease     a. s/p inferior MI 1998->PCI of RCA. b. S/p prior PCI of the OM1 and LAD;  c. 05/2013 Cath/PCI: LM nl, LAD 90p (3.0x22 Resolute DES), 47m w/ patent stent, Diag 40p, 60m, LCX patent OM1 stent, RCA 50p, 72m, EF 65%.  . Hyperlipidemia   . Hypertension   . Obesity, unspecified   . Hypothyroidism   . Diabetes mellitus     TYPE II  . Anxiety state, unspecified   . Depressive disorder, not elsewhere classified   . Degeneration of lumbar or lumbosacral intervertebral disc   . Allergic rhinitis, cause unspecified   . Lower GI bleed 06/2010    Diverticular bleed  . Chronic LBP   . AAA (abdominal aortic aneurysm)     a. 4.7cm in 08/2012. Followed by [facility].  . Gout 08/22/2013   Past Surgical History  Procedure Laterality Date  . Total abdominal hysterectomy    . Lumbar fusion  01/2007    DR. Patrice Paradise...3-LEVEL WITH FIXATION  . Parathyroidectomy    . Thyroidectomy    . Cardiac catheterization      PCI OF BOTH THE CIRCUMFLEX AND Brandi ANTERIOR DESCENDING ARTERY  . Cesarean section    . Heart stent  04-2010  and  Jun 07, 2013    X 3  . Spine surgery      reports that she quit smoking about 27 years ago. Her smoking use included Cigarettes. She smoked 0.00 packs per day for 30 years. She has never used smokeless tobacco. She reports  that she does not drink alcohol or use illicit drugs. family history includes Arthritis in her maternal aunt; Cancer in her maternal aunt; Diabetes in her brother and maternal aunt; Heart attack (age of onset: 20) in her mother; Heart attack (age of onset: 64) in her father; Heart disease in her father and mother. There is no history of Colon cancer. Allergies  Allergen Reactions  . Doxycycline     REACTION: gi upset  . Miconazole Nitrate     REACTION: hives  . Prilosec [Omeprazole] Other (See Comments)    Chest pain  . Augmentin [Amoxicillin-Pot Clavulanate] Hives, Itching and Rash   Current Outpatient Prescriptions on File Prior to Visit  Medication Sig Dispense Refill  . allopurinol (ZYLOPRIM) 300 MG tablet Take 1 tablet (300 mg total) by mouth daily.  90 tablet  3  . aspirin EC 81 MG tablet Take 1 tablet (81 mg total) by mouth daily. For 1 month.  Then resume taking 4 times a week (tue thur sat sun)      . atenolol (TENORMIN) 25 MG tablet Take 1 tablet (25 mg total) by mouth 2 (two) times daily.  180 tablet  3  . cetirizine (ZYRTEC) 10 MG chewable tablet  Chew 10 mg by mouth daily as needed for allergies.      . hydrOXYzine (ATARAX/VISTARIL) 10 MG tablet Take 1 tablet (10 mg total) by mouth 3 (three) times daily as needed for itching.  90 tablet  1  . levothyroxine (SYNTHROID, LEVOTHROID) 150 MCG tablet Take 1 tablet (150 mcg total) by mouth daily.  90 tablet  3  . meloxicam (MOBIC) 7.5 MG tablet Take 1 tablet (7.5 mg total) by mouth daily.  7 tablet  0  . metFORMIN (GLUCOPHAGE-XR) 500 MG 24 hr tablet Take 4 tablets (2,000 mg total) by mouth daily with breakfast.      . nitroGLYCERIN (NITROSTAT) 0.4 MG SL tablet Place 1 tablet (0.4 mg total) under the tongue every 5 (five) minutes as needed.  25 tablet  1  . oxyCODONE (OXY IR/ROXICODONE) 5 MG immediate release tablet 1-2 tabs every 6 hours as needed for pain, with limit 4 tabs per day - to fill Apr 19, 2013  120 tablet  0  . ticagrelor  (BRILINTA) 90 MG TABS tablet Take 1 tablet (90 mg total) by mouth 2 (two) times daily.  60 tablet  6  . tiZANidine (ZANAFLEX) 4 MG tablet Take 1 tablet (4 mg total) by mouth every 6 (six) hours as needed for muscle spasms.  40 tablet  1  . traMADol (ULTRAM) 50 MG tablet Take 1 tablet (50 mg total) by mouth every 6 (six) hours as needed for pain.  60 tablet  1  . rosuvastatin (CRESTOR) 20 MG tablet Take 1 tablet (20 mg total) by mouth daily.  90 tablet  3   No current facility-administered medications on file prior to visit.    Review of Systems  Constitutional: Negative for unusual diaphoresis or other sweats  HENT: Negative for ringing in [name] Eyes: Negative for double vision or worsening visual disturbance.  Respiratory: Negative for choking and stridor.   Gastrointestinal: Negative for vomiting or other signifcant bowel change Genitourinary: Negative for hematuria or decreased urine volume.  Musculoskeletal: Negative for other MSK pain or swelling Skin: Negative for color change and worsening wound.  Neurological: Negative for tremors and numbness other than noted  Psychiatric/Behavioral: Negative for decreased concentration or agitation other than above       Objective:   Physical Exam BP 114/70  Pulse 107  Temp(Src) 98.1 F (36.7 C) (Oral)  Wt 230 lb 8 oz (104.554 kg)  SpO2 94% VS noted,  Constitutional: Pt appears well-developed, well-nourished.  HENT: Head: NCAT.  Right [name]: External [name] normal.  [name]: External [name] normal.  Eyes: . Pupils are equal, round, and reactive to light. Conjunctivae and EOM are normal Neck: Normal range of motion. Neck supple.  Cardiovascular: Normal rate and regular rhythm.   Pulmonary/Chest: Effort normal and breath sounds normal.  Abd: soft, marked tender as before right side Neurological: Pt is alert. Not confused , motor grossly intact Skin: Skin is warm. No rash Psychiatric: Pt behavior is normal. No agitation.     Assessment &  Plan:

## 2013-08-22 NOTE — Assessment & Plan Note (Signed)
stable overall by history and exam, recent data reviewed with pt, and pt to continue medical treatment as before,  to f/u any worsening symptoms or concerns BP Readings from Last 3 Encounters:  08/22/13 114/70  08/13/13 108/62  08/08/13 100/72

## 2013-08-23 NOTE — Telephone Encounter (Signed)
Pt has history of intolerance to statins.  Plan to continue Crestor per Dr Burt Knack.

## 2013-08-26 DIAGNOSIS — M545 Low back pain, unspecified: Secondary | ICD-10-CM | POA: Diagnosis not present

## 2013-08-26 DIAGNOSIS — G894 Chronic pain syndrome: Secondary | ICD-10-CM | POA: Diagnosis not present

## 2013-08-26 DIAGNOSIS — Z79899 Other long term (current) drug therapy: Secondary | ICD-10-CM | POA: Diagnosis not present

## 2013-09-05 ENCOUNTER — Ambulatory Visit (INDEPENDENT_AMBULATORY_CARE_PROVIDER_SITE_OTHER): Payer: Medicare Other | Admitting: Internal Medicine

## 2013-09-05 ENCOUNTER — Encounter: Payer: Self-pay | Admitting: Internal Medicine

## 2013-09-05 ENCOUNTER — Ambulatory Visit (INDEPENDENT_AMBULATORY_CARE_PROVIDER_SITE_OTHER)
Admission: RE | Admit: 2013-09-05 | Discharge: 2013-09-05 | Disposition: A | Discharge status: Home / Self Care | Payer: Medicare Other | Source: Ambulatory Visit | Attending: Internal Medicine | Admitting: Internal Medicine

## 2013-09-05 VITALS — BP 112/78 | HR 79 | Temp 98.6°F | Wt 228.0 lb

## 2013-09-05 DIAGNOSIS — R109 Unspecified abdominal pain: Secondary | ICD-10-CM

## 2013-09-05 DIAGNOSIS — Z23 Encounter for immunization: Secondary | ICD-10-CM | POA: Diagnosis not present

## 2013-09-05 DIAGNOSIS — R52 Pain, unspecified: Secondary | ICD-10-CM

## 2013-09-05 DIAGNOSIS — I2510 Atherosclerotic heart disease of native coronary artery without angina pectoris: Secondary | ICD-10-CM

## 2013-09-05 DIAGNOSIS — I714 Abdominal aortic aneurysm, without rupture, unspecified: Secondary | ICD-10-CM | POA: Diagnosis not present

## 2013-09-05 DIAGNOSIS — E119 Type 2 diabetes mellitus without complications: Secondary | ICD-10-CM

## 2013-09-05 DIAGNOSIS — K5730 Diverticulosis of large intestine without perforation or abscess without bleeding: Secondary | ICD-10-CM | POA: Diagnosis not present

## 2013-09-05 MED ORDER — PROMETHAZINE HCL 25 MG/ML IJ SOLN
25.0000 mg | Freq: Once | INTRAMUSCULAR | Status: AC
  Administered 2013-09-05: 25 mg via INTRAMUSCULAR

## 2013-09-05 MED ORDER — MEPERIDINE HCL 50 MG/ML IJ SOLN
25.0000 mg | Freq: Once | INTRAMUSCULAR | Status: AC
  Administered 2013-09-05: 25 mg via INTRAMUSCULAR

## 2013-09-05 NOTE — Progress Notes (Signed)
Pre visit review using our clinic review tool, if applicable. No additional management support is needed unless otherwise documented below in the visit note. 

## 2013-09-05 NOTE — Patient Instructions (Signed)
Please continue all other medications as before, and refills have been done if requested.  Please have the pharmacy call with any other refills you may need.  Please continue your efforts at being more active, low cholesterol diet, and weight control.  You are otherwise up to date with prevention measures today.  Please keep your appointments with your specialists as you may have planned  Please go to the LAB in the Basement (turn left off the elevator) for the tests to be done today - just the urine test  You will be contacted regarding the referral for: CT abd/pelvis with [facility]  - to see before leaving today

## 2013-09-05 NOTE — Assessment & Plan Note (Signed)
Persistent pain, cant r/o renal stone or other, will order ct abd/pelvis without CM , repeat UA, aug 6 labs reviewed, wil; hold on furhter labs at this time

## 2013-09-05 NOTE — Progress Notes (Signed)
Subjective:    Patient ID: [name], female    DOB: February 04, 1945, 68 y.o.   MRN: [id number]  HPI Here to f/u, still with c/o right side/right flank tender pain, dull, still severe, no radiation,  Laying on right side makes better.Toradol IM last visit no help.  Denies urinary symptoms such as dysuria, frequency, urgency, flank pain, hematuria or n/v, fever, chills, but had UTI previously without symptoms. No personal hx of stone.  Pt denies fever, wt loss, night sweats, loss of appetite, or other constitutional symptoms  Recent abd u/s neg for acute, had stable AAA 4.7 cm. Has chronic LBP, but recent eval neg for worsening, pain not felt to be radicular.  Pt denies polydipsia, polyuria, or low sugar symptoms such as weakness or confusion improved with po intake.  Pt states overall good compliance with meds, trying to follow lower cholesterol, diabetic diet, wt overall stable but little exercise however.    Past Medical History  Diagnosis Date  . Coronary artery disease     a. s/p inferior MI 1998->PCI of RCA. b. S/p prior PCI of the OM1 and LAD;  c. 05/2013 Cath/PCI: LM nl, LAD 90p (3.0x22 Resolute DES), 53m w/ patent stent, Diag 40p, 57m, LCX patent OM1 stent, RCA 50p, 52m, EF 65%.  . Hyperlipidemia   . Hypertension   . Obesity, unspecified   . Hypothyroidism   . Diabetes mellitus     TYPE II  . Anxiety state, unspecified   . Depressive disorder, not elsewhere classified   . Degeneration of lumbar or lumbosacral intervertebral disc   . Allergic rhinitis, cause unspecified   . Lower GI bleed 06/2010    Diverticular bleed  . Chronic LBP   . AAA (abdominal aortic aneurysm)     a. 4.7cm in 08/2012. Followed by [facility].  . Gout 08/22/2013   Past Surgical History  Procedure Laterality Date  . Total abdominal hysterectomy    . Lumbar fusion  01/2007    DR. Patrice Paradise...3-LEVEL WITH FIXATION  . Parathyroidectomy    . Thyroidectomy    . Cardiac catheterization      PCI OF BOTH THE CIRCUMFLEX AND LEFT  ANTERIOR DESCENDING ARTERY  . Cesarean section    . Heart stent  04-2010  and  Jun 07, 2013    X 3  . Spine surgery      reports that she quit smoking about 27 years ago. Her smoking use included Cigarettes. She smoked 0.00 packs per day for 30 years. She has never used smokeless tobacco. She reports that she does not drink alcohol or use illicit drugs. family history includes Arthritis in her maternal aunt; Cancer in her maternal aunt; Diabetes in her brother and maternal aunt; Heart attack (age of onset: 12) in her mother; Heart attack (age of onset: 75) in her father; Heart disease in her father and mother. There is no history of Colon cancer. Allergies  Allergen Reactions  . Doxycycline     REACTION: gi upset  . Miconazole Nitrate     REACTION: hives  . Prilosec [Omeprazole] Other (See Comments)    Chest pain  . Augmentin [Amoxicillin-Pot Clavulanate] Hives, Itching and Rash   Current Outpatient Prescriptions on File Prior to Visit  Medication Sig Dispense Refill  . allopurinol (ZYLOPRIM) 300 MG tablet Take 1 tablet (300 mg total) by mouth daily.  90 tablet  3  . aspirin EC 81 MG tablet Take 1 tablet (81 mg total) by mouth daily. For 1  month.  Then resume taking 4 times a week (tue thur sat sun)      . atenolol (TENORMIN) 25 MG tablet Take 1 tablet (25 mg total) by mouth 2 (two) times daily.  180 tablet  3  . cetirizine (ZYRTEC) 10 MG chewable tablet Chew 10 mg by mouth daily as needed for allergies.      [name] gabapentin (NEURONTIN) 300 MG capsule Take 1 capsule (300 mg total) by mouth 3 (three) times daily.  90 capsule  5  . hydrOXYzine (ATARAX/VISTARIL) 10 MG tablet Take 1 tablet (10 mg total) by mouth 3 (three) times daily as needed for itching.  90 tablet  1  . levothyroxine (SYNTHROID, LEVOTHROID) 150 MCG tablet Take 1 tablet (150 mcg total) by mouth daily.  90 tablet  3  . meloxicam (MOBIC) 7.5 MG tablet Take 1 tablet (7.5 mg total) by mouth daily.  7 tablet  0  . metFORMIN  (GLUCOPHAGE-XR) 500 MG 24 hr tablet Take 4 tablets (2,000 mg total) by mouth daily with breakfast.      . nitroGLYCERIN (NITROSTAT) 0.4 MG SL tablet Place 1 tablet (0.4 mg total) under the tongue every 5 (five) minutes as needed.  25 tablet  1  . oxyCODONE (OXY IR/ROXICODONE) 5 MG immediate release tablet 1-2 tabs every 6 hours as needed for pain, with limit 4 tabs per day - to fill Apr 19, 2013  120 tablet  0  . ticagrelor (BRILINTA) 90 MG TABS tablet Take 1 tablet (90 mg total) by mouth 2 (two) times daily.  60 tablet  6  . tiZANidine (ZANAFLEX) 4 MG tablet Take 1 tablet (4 mg total) by mouth every 6 (six) hours as needed for muscle spasms.  40 tablet  1  . traMADol (ULTRAM) 50 MG tablet Take 1 tablet (50 mg total) by mouth every 6 (six) hours as needed for pain.  60 tablet  1  . rosuvastatin (CRESTOR) 20 MG tablet Take 1 tablet (20 mg total) by mouth daily.  90 tablet  3   No current facility-administered medications on file prior to visit.   Review of Systems  Constitutional: Negative for unusual diaphoresis or other sweats  HENT: Negative for ringing in ear Eyes: Negative for double vision or worsening visual disturbance.  Respiratory: Negative for choking and stridor.   Gastrointestinal: Negative for vomiting or other signifcant bowel change Genitourinary: Negative for hematuria or decreased urine volume.  Musculoskeletal: Negative for other MSK pain or swelling Skin: Negative for color change and worsening wound.  Neurological: Negative for tremors and numbness other than noted  Psychiatric/Behavioral: Negative for decreased concentration or agitation other than above       Objective:   Physical Exam BP 112/78  Pulse 79  Temp(Src) 98.6 F (37 C) (Oral)  Wt 228 lb (103.42 kg)  SpO2 94% VS noted,  Constitutional: Pt appears well-developed, well-nourished.  HENT: Head: NCAT.  Right Ear: External ear normal.  Left Ear: External ear normal.  Eyes: . Pupils are equal, round, and  reactive to light. Conjunctivae and EOM are normal Neck: Normal range of motion. Neck supple.  Cardiovascular: Normal rate and regular rhythm.   Pulmonary/Chest: Effort normal and breath sounds normal.  Abd:  Soft, ND, + BS with tender right mid abd/right flank approx t10 Neurological: Pt is alert. Not confused , motor grossly intact Skin: Skin is warm. No rash Psychiatric: Pt behavior is normal. No agitation.     Assessment & Plan:

## 2013-09-05 NOTE — Addendum Note (Signed)
Addended by: Sharon Seller B on: 09/05/2013 05:20 PM   Modules accepted: Orders

## 2013-09-05 NOTE — Assessment & Plan Note (Signed)
stable overall by history and exam, recent data reviewed with pt, and pt to continue medical treatment as before,  to f/u any worsening symptoms or concerns Lab Results  Component Value Date   HGBA1C 7.0* 08/22/2013

## 2013-09-10 ENCOUNTER — Other Ambulatory Visit (INDEPENDENT_AMBULATORY_CARE_PROVIDER_SITE_OTHER): Payer: Medicare Other

## 2013-09-10 DIAGNOSIS — R109 Unspecified abdominal pain: Secondary | ICD-10-CM

## 2013-09-10 DIAGNOSIS — R52 Pain, unspecified: Secondary | ICD-10-CM

## 2013-09-13 ENCOUNTER — Telehealth: Payer: Self-pay | Admitting: Internal Medicine

## 2013-09-13 DIAGNOSIS — M545 Low back pain, unspecified: Secondary | ICD-10-CM | POA: Diagnosis not present

## 2013-09-13 DIAGNOSIS — G894 Chronic pain syndrome: Secondary | ICD-10-CM | POA: Diagnosis not present

## 2013-09-13 LAB — URINALYSIS, ROUTINE W REFLEX MICROSCOPIC
Bilirubin Urine: NEGATIVE
Hgb urine dipstick: NEGATIVE
KETONES UR: NEGATIVE
NITRITE: NEGATIVE
PH: 5.5 (ref 5.0–8.0)
SPECIFIC GRAVITY, URINE: 1.01 (ref 1.000–1.030)
Total Protein, Urine: NEGATIVE
Urine Glucose: NEGATIVE
Urobilinogen, UA: 1 (ref 0.0–1.0)

## 2013-09-13 MED ORDER — FLUCONAZOLE 150 MG PO TABS: ORAL_TABLET | ORAL | Status: DC

## 2013-09-13 MED ORDER — CIPROFLOXACIN HCL 500 MG PO TABS: 500.0000 mg | ORAL_TABLET | Freq: Two times a day (BID) | ORAL | Status: DC

## 2013-09-13 NOTE — Telephone Encounter (Signed)
Patient informed. 

## 2013-09-13 NOTE — Telephone Encounter (Signed)
Message copied by Biagio Borg on Fri Sep 13, 2013  3:02 PM ------      Message from: Sharon Seller B      Created: Fri Sep 13, 2013  2:56 PM       Called the patient and "yes" she is having symptoms .  Please do call in an antibiotic to her local pharmacy ------

## 2013-09-13 NOTE — Telephone Encounter (Signed)
rx done erx 

## 2013-09-13 NOTE — Telephone Encounter (Signed)
Message copied by Biagio Borg on Fri Sep 13, 2013  4:37 PM ------      Message from: Sharon Seller B      Created: Fri Sep 13, 2013  3:10 PM       Please send in something for yeast to have since taking an antibiotic ------

## 2013-09-15 LAB — URINE CULTURE: Colony Count: 75000

## 2013-09-25 ENCOUNTER — Ambulatory Visit (INDEPENDENT_AMBULATORY_CARE_PROVIDER_SITE_OTHER): Payer: Medicare Other | Admitting: Internal Medicine

## 2013-09-25 ENCOUNTER — Encounter: Payer: Self-pay | Admitting: Internal Medicine

## 2013-09-25 VITALS — BP 122/72 | HR 91 | Temp 99.0°F | Wt 224.0 lb

## 2013-09-25 DIAGNOSIS — IMO0001 Reserved for inherently not codable concepts without codable children: Secondary | ICD-10-CM

## 2013-09-25 DIAGNOSIS — I2510 Atherosclerotic heart disease of native coronary artery without angina pectoris: Secondary | ICD-10-CM

## 2013-09-25 DIAGNOSIS — E119 Type 2 diabetes mellitus without complications: Secondary | ICD-10-CM | POA: Diagnosis not present

## 2013-09-25 DIAGNOSIS — J069 Acute upper respiratory infection, unspecified: Secondary | ICD-10-CM | POA: Diagnosis not present

## 2013-09-25 DIAGNOSIS — R6884 Jaw pain: Secondary | ICD-10-CM | POA: Diagnosis not present

## 2013-09-25 DIAGNOSIS — M791 Myalgia, unspecified site: Secondary | ICD-10-CM

## 2013-09-25 HISTORY — DX: Myalgia, unspecified site: M79.10

## 2013-09-25 MED ORDER — LEVOFLOXACIN 250 MG PO TABS: 250.0000 mg | ORAL_TABLET | Freq: Every day | ORAL | Status: DC

## 2013-09-25 MED ORDER — HYDROCODONE-HOMATROPINE 5-1.5 MG/5ML PO SYRP: 5.0000 mL | ORAL_SOLUTION | Freq: Four times a day (QID) | ORAL | Status: DC | PRN

## 2013-09-25 MED ORDER — KETOROLAC TROMETHAMINE 30 MG/ML IJ SOLN
30.0000 mg | Freq: Once | INTRAMUSCULAR | Status: AC
  Administered 2013-09-25: 30 mg via INTRAMUSCULAR

## 2013-09-25 NOTE — Patient Instructions (Signed)
Please take all new medication as prescribed - the antibiotic, and cough medicine  You can also take Delsym OTC for cough, and/or Mucinex (or it's generic off brand) for congestion, and tylenol as needed for pain.  Please continue all other medications as before, and refills have been done if requested.  Please have the pharmacy call with any other refills you may need.  Please keep your appointments with your specialists as you may have planned

## 2013-09-25 NOTE — Progress Notes (Signed)
Subjective:    Patient ID: [name], female    DOB: 05-09-1945, 68 y.o.   MRN: [id number]  HPI  Here with 2-3 days acute onset fever, facial pain, pressure, headache, general weakness and malaise, and greenish d/c, with mild ST and cough, but pt denies chest pain, wheezing, increased sob or doe, orthopnea, PND, increased LE swelling, palpitations, dizziness or syncope. Has also right neck pain and large swelling near angle to jaw, tender, no skin change or drainage.  ALso marked diffuse myalgias and feeling chilly, hot and cold.  Asks for toradol as has helped with prior visits.  Pt denies new neurological symptoms such as new headache, or facial or extremity weakness or numbness   Pt denies polydipsia, polyuria,     Past Medical History  Diagnosis Date  . Coronary artery disease     a. s/p inferior MI 1998->PCI of RCA. b. S/p prior PCI of the OM1 and LAD;  c. 05/2013 Cath/PCI: LM nl, LAD 90p (3.0x22 Resolute DES), 62m w/ patent stent, Diag 40p, 74m, LCX patent OM1 stent, RCA 50p, 78m, EF 65%.  . Hyperlipidemia   . Hypertension   . Obesity, unspecified   . Hypothyroidism   . Diabetes mellitus     TYPE II  . Anxiety state, unspecified   . Depressive disorder, not elsewhere classified   . Degeneration of lumbar or lumbosacral intervertebral disc   . Allergic rhinitis, cause unspecified   . Lower GI bleed 06/2010    Diverticular bleed  . Chronic LBP   . AAA (abdominal aortic aneurysm)     a. 4.7cm in 08/2012. Followed by [facility].  . Gout 08/22/2013   Past Surgical History  Procedure Laterality Date  . Total abdominal hysterectomy    . Lumbar fusion  01/2007    DR. Patrice Paradise...3-LEVEL WITH FIXATION  . Parathyroidectomy    . Thyroidectomy    . Cardiac catheterization      PCI OF BOTH THE CIRCUMFLEX AND LEFT ANTERIOR DESCENDING ARTERY  . Cesarean section    . Heart stent  04-2010  and  Jun 07, 2013    X 3  . Spine surgery      reports that she quit smoking about 27 years ago. Her smoking  use included Cigarettes. She smoked 0.00 packs per day for 30 years. She has never used smokeless tobacco. She reports that she does not drink alcohol or use illicit drugs. family history includes Arthritis in her maternal aunt; Cancer in her maternal aunt; Diabetes in her brother and maternal aunt; Heart attack (age of onset: 32) in her mother; Heart attack (age of onset: 21) in her father; Heart disease in her father and mother. There is no history of Colon cancer. Allergies  Allergen Reactions  . Doxycycline     REACTION: gi upset  . Miconazole Nitrate     REACTION: hives  . Prilosec [Omeprazole] Other (See Comments)    Chest pain  . Augmentin [Amoxicillin-Pot Clavulanate] Hives, Itching and Rash   Current Outpatient Prescriptions on File Prior to Visit  Medication Sig Dispense Refill  . allopurinol (ZYLOPRIM) 300 MG tablet Take 1 tablet (300 mg total) by mouth daily.  90 tablet  3  . aspirin EC 81 MG tablet Take 1 tablet (81 mg total) by mouth daily. For 1 month.  Then resume taking 4 times a week (tue thur sat sun)      . atenolol (TENORMIN) 25 MG tablet Take 1 tablet (25 mg total) by  mouth 2 (two) times daily.  180 tablet  3  . cetirizine (ZYRTEC) 10 MG chewable tablet Chew 10 mg by mouth daily as needed for allergies.      . fluconazole (DIFLUCAN) 150 MG tablet 1 tab by mouth every 3 days as needed  2 tablet  1  . gabapentin (NEURONTIN) 300 MG capsule Take 1 capsule (300 mg total) by mouth 3 (three) times daily.  90 capsule  5  . hydrOXYzine (ATARAX/VISTARIL) 10 MG tablet Take 1 tablet (10 mg total) by mouth 3 (three) times daily as needed for itching.  90 tablet  1  . levothyroxine (SYNTHROID, LEVOTHROID) 150 MCG tablet Take 1 tablet (150 mcg total) by mouth daily.  90 tablet  3  . meloxicam (MOBIC) 7.5 MG tablet Take 1 tablet (7.5 mg total) by mouth daily.  7 tablet  0  . metFORMIN (GLUCOPHAGE-XR) 500 MG 24 hr tablet Take 4 tablets (2,000 mg total) by mouth daily with breakfast.        . nitroGLYCERIN (NITROSTAT) 0.4 MG SL tablet Place 1 tablet (0.4 mg total) under the tongue every 5 (five) minutes as needed.  25 tablet  1  . oxyCODONE (OXY IR/ROXICODONE) 5 MG immediate release tablet 1-2 tabs every 6 hours as needed for pain, with limit 4 tabs per day - to fill Apr 19, 2013  120 tablet  0  . ticagrelor (BRILINTA) 90 MG TABS tablet Take 1 tablet (90 mg total) by mouth 2 (two) times daily.  60 tablet  6  . tiZANidine (ZANAFLEX) 4 MG tablet Take 1 tablet (4 mg total) by mouth every 6 (six) hours as needed for muscle spasms.  40 tablet  1  . traMADol (ULTRAM) 50 MG tablet Take 1 tablet (50 mg total) by mouth every 6 (six) hours as needed for pain.  60 tablet  1  . rosuvastatin (CRESTOR) 20 MG tablet Take 1 tablet (20 mg total) by mouth daily.  90 tablet  3   No current facility-administered medications on file prior to visit.   Review of Systems  Constitutional: Negative for unusual diaphoresis or other sweats  HENT: Negative for ringing in ear Eyes: Negative for double vision or worsening visual disturbance.  Respiratory: Negative for choking and stridor.   Gastrointestinal: Negative for vomiting or other signifcant bowel change Genitourinary: Negative for hematuria or decreased urine volume.  Musculoskeletal: Negative for other MSK pain or swelling Skin: Negative for color change and worsening wound.  Neurological: Negative for tremors and numbness other than noted  Psychiatric/Behavioral: Negative for decreased concentration or agitation other than above       Objective:   Physical Exam BP 122/72  Pulse 91  Temp(Src) 99 F (37.2 C) (Oral)  Wt 224 lb (101.606 kg)  SpO2 97% VS noted, mild ill Constitutional: Pt appears well-developed, well-nourished.  HENT: Head: NCAT.  Right Ear: External ear normal.  Left Ear: External ear normal.  Eyes: . Pupils are equal, round, and reactive to light. Conjunctivae and EOM are normal Bilat tm's with mild erythema.  Max sinus  areas mild tender.  Pharynx with mild erythema, no exudate Neck: Normal range of motion. Neck supple. moderate right neck tender submandibular LA noted Cardiovascular: Normal rate and regular rhythm.   Pulmonary/Chest: Effort normal and breath sounds normal.  - no rales or wheezing Neurological: Pt is alert. Not confused , motor grossly intact Skin: Skin is warm. No rash Psychiatric: Pt behavior is normal. No agitation.  Assessment & Plan:

## 2013-09-25 NOTE — Progress Notes (Signed)
Pre visit review using our clinic review tool, if applicable. No additional management support is needed unless otherwise documented below in the visit note. 

## 2013-09-29 NOTE — Assessment & Plan Note (Signed)
Lab Results  Component Value Date   HGBA1C 7.0* 08/22/2013   stable overall by history and exam, recent data reviewed with pt, and pt to continue medical treatment as before,  to f/u any worsening symptoms or concerns, pt to call for cbg > 200 or onset polys with infection

## 2013-09-29 NOTE — Assessment & Plan Note (Signed)
Related to URI and lymphadenitis, for tylenol prn, and antibx as above

## 2013-09-29 NOTE — Assessment & Plan Note (Signed)
Likely related to acute infection, for toradol IM,  to f/u any worsening symptoms or concerns

## 2013-09-29 NOTE — Assessment & Plan Note (Signed)
Mild to mod, for antibx course,  to f/u any worsening symptoms or concerns 

## 2013-10-25 DIAGNOSIS — M4716 Other spondylosis with myelopathy, lumbar region: Secondary | ICD-10-CM | POA: Diagnosis not present

## 2013-10-25 DIAGNOSIS — M545 Low back pain: Secondary | ICD-10-CM | POA: Diagnosis not present

## 2013-10-25 DIAGNOSIS — M47816 Spondylosis without myelopathy or radiculopathy, lumbar region: Secondary | ICD-10-CM | POA: Diagnosis not present

## 2013-11-11 ENCOUNTER — Other Ambulatory Visit: Payer: Self-pay | Admitting: Internal Medicine

## 2013-11-15 ENCOUNTER — Encounter: Payer: Self-pay | Admitting: Cardiovascular Disease

## 2013-11-21 ENCOUNTER — Telehealth: Payer: Self-pay | Admitting: *Deleted

## 2013-11-21 ENCOUNTER — Other Ambulatory Visit (INDEPENDENT_AMBULATORY_CARE_PROVIDER_SITE_OTHER): Payer: Medicare Other

## 2013-11-21 ENCOUNTER — Encounter: Payer: Self-pay | Admitting: Internal Medicine

## 2013-11-21 ENCOUNTER — Ambulatory Visit (INDEPENDENT_AMBULATORY_CARE_PROVIDER_SITE_OTHER): Payer: Medicare Other | Admitting: Internal Medicine

## 2013-11-21 VITALS — BP 114/67 | HR 95 | Temp 98.6°F | Ht 66.0 in | Wt 229.4 lb

## 2013-11-21 DIAGNOSIS — G8929 Other chronic pain: Secondary | ICD-10-CM

## 2013-11-21 DIAGNOSIS — E08311 Diabetes mellitus due to underlying condition with unspecified diabetic retinopathy with macular edema: Secondary | ICD-10-CM | POA: Diagnosis not present

## 2013-11-21 DIAGNOSIS — M545 Low back pain, unspecified: Secondary | ICD-10-CM

## 2013-11-21 DIAGNOSIS — I10 Essential (primary) hypertension: Secondary | ICD-10-CM

## 2013-11-21 DIAGNOSIS — I2510 Atherosclerotic heart disease of native coronary artery without angina pectoris: Secondary | ICD-10-CM | POA: Diagnosis not present

## 2013-11-21 LAB — URINALYSIS, ROUTINE W REFLEX MICROSCOPIC
Bilirubin Urine: NEGATIVE
Hgb urine dipstick: NEGATIVE
KETONES UR: NEGATIVE
Leukocytes, UA: NEGATIVE
Nitrite: NEGATIVE
Specific Gravity, Urine: 1.015 (ref 1.000–1.030)
Total Protein, Urine: NEGATIVE
URINE GLUCOSE: NEGATIVE
UROBILINOGEN UA: 0.2 (ref 0.0–1.0)
pH: 5.5 (ref 5.0–8.0)

## 2013-11-21 MED ORDER — OXYCODONE HCL 5 MG PO TABS: ORAL_TABLET | ORAL | Status: DC

## 2013-11-21 MED ORDER — KETOROLAC TROMETHAMINE 30 MG/ML IJ SOLN
30.0000 mg | Freq: Once | INTRAMUSCULAR | Status: AC
  Administered 2013-11-21: 30 mg via INTRAMUSCULAR

## 2013-11-21 NOTE — Progress Notes (Signed)
Subjective:    Patient ID: [name], female    DOB: Jul 15, 1945, 68 y.o.   MRN: [id number]  HPI   Here to f/u; overall doing ok,  Pt denies chest pain, increased sob or doe, wheezing, orthopnea, PND, increased LE swelling, palpitations, dizziness or syncope.  Pt denies polydipsia, polyuria, or low sugar symptoms such as weakness or confusion improved with po intake.  Pt denies new neurological symptoms such as new headache, or facial or extremity weakness or numbness.   Pt states overall good compliance with meds, has been trying to follow lower cholesterol, diabetic diet, with wt overall stable,  but little exercise however.  Recent a1c oct 27 per cardiology study labs was 6.4, with normal cbc, cmet except 131 glc. Pt continues to have recurring LBP without change in severity, bowel or bladder change, fever, wt loss,  worsening LE pain/numbness/weakness, gait change or falls.  Seeing Spine and scoliosis, and plans for second opinon with [facility], has been referred once, back improved, so did not keep the appt, Needs repeat referral for second opinion.  Also today with aching right ear with flare of acute on chronic pain, as well as achiness to bilat upper trapezoid and bilat lumbar areas as well  - ? FMS like.  Also with some urinary freq - ? UTI as well today for several days. Past Medical History  Diagnosis Date  . Coronary artery disease     a. s/p inferior MI 1998->PCI of RCA. b. S/p prior PCI of the OM1 and LAD;  c. 05/2013 Cath/PCI: LM nl, LAD 90p (3.0x22 Resolute DES), 19m w/ patent stent, Diag 40p, 74m, LCX patent OM1 stent, RCA 50p, 56m, EF 65%.  . Hyperlipidemia   . Hypertension   . Obesity, unspecified   . Hypothyroidism   . Diabetes mellitus     TYPE II  . Anxiety state, unspecified   . Depressive disorder, not elsewhere classified   . Degeneration of lumbar or lumbosacral intervertebral disc   . Allergic rhinitis, cause unspecified   . Lower GI bleed 06/2010    Diverticular  bleed  . Chronic LBP   . AAA (abdominal aortic aneurysm)     a. 4.7cm in 08/2012. Followed by [facility].  . Gout 08/22/2013   Past Surgical History  Procedure Laterality Date  . Total abdominal hysterectomy    . Lumbar fusion  01/2007    DR. Patrice Paradise...3-LEVEL WITH FIXATION  . Parathyroidectomy    . Thyroidectomy    . Cardiac catheterization      PCI OF BOTH THE CIRCUMFLEX AND LEFT ANTERIOR DESCENDING ARTERY  . Cesarean section    . Heart stent  04-2010  and  Jun 07, 2013    X 3  . Spine surgery      reports that she quit smoking about 27 years ago. Her smoking use included Cigarettes. She smoked 0.00 packs per day for 30 years. She has never used smokeless tobacco. She reports that she does not drink alcohol or use illicit drugs. family history includes Arthritis in her maternal aunt; Cancer in her maternal aunt; Diabetes in her brother and maternal aunt; Heart attack (age of onset: 18) in her mother; Heart attack (age of onset: 43) in her father; Heart disease in her father and mother. There is no history of Colon cancer. Allergies  Allergen Reactions  . Doxycycline     REACTION: gi upset  . Miconazole Nitrate     REACTION: hives  . Prilosec [Omeprazole] Other (  See Comments)    Chest pain  . Augmentin [Amoxicillin-Pot Clavulanate] Hives, Itching and Rash   Current Outpatient Prescriptions on File Prior to Visit  Medication Sig Dispense Refill  . allopurinol (ZYLOPRIM) 300 MG tablet Take 1 tablet (300 mg total) by mouth daily. 90 tablet 3  . aspirin EC 81 MG tablet Take 1 tablet (81 mg total) by mouth daily. For 1 month.  Then resume taking 4 times a week (tue thur sat sun)    . atenolol (TENORMIN) 25 MG tablet Take 1 tablet (25 mg total) by mouth 2 (two) times daily. 180 tablet 3  . cetirizine (ZYRTEC) 10 MG chewable tablet Chew 10 mg by mouth daily as needed for allergies.    . fluconazole (DIFLUCAN) 150 MG tablet 1 tab by mouth every 3 days as needed 2 tablet 1  . gabapentin (NEURONTIN)  300 MG capsule Take 1 capsule (300 mg total) by mouth 3 (three) times daily. 90 capsule 5  . hydrOXYzine (ATARAX/VISTARIL) 10 MG tablet Take 1 tablet (10 mg total) by mouth 3 (three) times daily as needed for itching. 90 tablet 1  . levofloxacin (LEVAQUIN) 250 MG tablet Take 1 tablet (250 mg total) by mouth daily. 10 tablet 0  . meloxicam (MOBIC) 7.5 MG tablet Take 1 tablet (7.5 mg total) by mouth daily. 7 tablet 0  . metFORMIN (GLUCOPHAGE-XR) 500 MG 24 hr tablet Take 4 tablets (2,000 mg total) by mouth daily with breakfast.    . nitroGLYCERIN (NITROSTAT) 0.4 MG SL tablet Place 1 tablet (0.4 mg total) under the tongue every 5 (five) minutes as needed. 25 tablet 1  . oxyCODONE (OXY IR/ROXICODONE) 5 MG immediate release tablet 1-2 tabs every 6 hours as needed for pain, with limit 4 tabs per day - to fill Apr 19, 2013 120 tablet 0  . SYNTHROID 150 MCG tablet TAKE 1 TABLET BY MOUTH ONCE DAILY 90 tablet 0  . ticagrelor (BRILINTA) 90 MG TABS tablet Take 1 tablet (90 mg total) by mouth 2 (two) times daily. 60 tablet 6  . tiZANidine (ZANAFLEX) 4 MG tablet Take 1 tablet (4 mg total) by mouth every 6 (six) hours as needed for muscle spasms. 40 tablet 1  . traMADol (ULTRAM) 50 MG tablet Take 1 tablet (50 mg total) by mouth every 6 (six) hours as needed for pain. 60 tablet 1  . HYDROcodone-homatropine (HYCODAN) 5-1.5 MG/5ML syrup Take 5 mLs by mouth every 6 (six) hours as needed for cough. 180 mL 0  . rosuvastatin (CRESTOR) 20 MG tablet Take 1 tablet (20 mg total) by mouth daily. 90 tablet 3   No current facility-administered medications on file prior to visit.   Review of Systems  Constitutional: Negative for unusual diaphoresis or other sweats  HENT: Negative for ringing in ear Eyes: Negative for double vision or worsening visual disturbance.  Respiratory: Negative for choking and stridor.   Gastrointestinal: Negative for vomiting or other signifcant bowel change Genitourinary: Negative for hematuria or  decreased urine volume.  Musculoskeletal: Negative for other MSK pain or swelling Skin: Negative for color change and worsening wound.  Neurological: Negative for tremors and numbness other than noted  Psychiatric/Behavioral: Negative for decreased concentration or agitation other than above       Objective:   Physical Exam BP 114/67 mmHg  Pulse 95  Temp(Src) 98.6 F (37 C) (Oral)  Ht 5\' 6"  (1.676 m)  Wt 229 lb 6 oz (104.044 kg)  BMI 37.04 kg/m2  SpO2 96% VS noted,  Constitutional: Pt appears well-developed, well-nourished.  HENT: Head: NCAT.  Right Ear: External ear normal.  Left Ear: External ear normal.  Eyes: . Pupils are equal, round, and reactive to light. Conjunctivae and EOM are normal Neck: Normal range of motion. Neck supple.  Cardiovascular: Normal rate and regular rhythm.   Pulmonary/Chest: Effort normal and breath sounds normal.  Neurological: Pt is alert. Not confused , motor grossly intact Skin: Skin is warm. No rash Sore/tender to mult spots to the bilat trapezoid and paravertebral lumbar Psychiatric: Pt behavior is normal. No agitation.     Assessment & Plan:

## 2013-11-21 NOTE — Assessment & Plan Note (Signed)
With recurrent flares of pain, as well as even ? FMS like pain today, has been rec'd for surgury, and now pain worse again so wants second opinion referral, pain control; also for UA - r/o UTI, and toradol 30 mg IM now

## 2013-11-21 NOTE — Progress Notes (Signed)
Pre visit review using our clinic review tool, if applicable. No additional management support is needed unless otherwise documented below in the visit note. 

## 2013-11-21 NOTE — Assessment & Plan Note (Signed)
stable overall by history and exam, recent data reviewed with pt, and pt to continue medical treatment as before,  to f/u any worsening symptoms or concerns BP Readings from Last 3 Encounters:  11/21/13 114/67  09/25/13 122/72  09/05/13 112/78

## 2013-11-21 NOTE — Telephone Encounter (Signed)
I notified patients of lab results from final [facility] visit. Patient had LDL that was 137. I asked patient if taking Crestor 20 mg qd. Patient stated not taking everyday. I reminded her to take every day and follow-up with Dr. Burt Knack for management of hyperlipidemia. Patient verbalized understanding.She asked to follow-up with Dr.Cooper and I will send to [facility] to get patient follow-up appointment with Dr. Burt Knack.

## 2013-11-21 NOTE — Patient Instructions (Signed)
You had the pain shot today  Please continue all other medications as before, and refills have been done if requested - the oxycodone  Please have the pharmacy call with any other refills you may need.  Please continue your efforts at being more active, low cholesterol diet, and weight control.  You are otherwise up to date with prevention measures today.  Please keep your appointments with your specialists as you may have planned  You will be contacted regarding the referral for: second opinion with [facility]  Please go to the LAB in the Basement (turn left off the elevator) for the tests to be done today - just the urine testing today  You will be contacted by phone if any changes need to be made immediately.  Otherwise, you will receive a letter about your results with an explanation, but please check with MyChart first.  Please remember to sign up for MyChart if you have not done so, as this will be important to you in the future with finding out test results, communicating by private email, and scheduling acute appointments online when needed.  Please return in 6 months, or sooner if needed

## 2013-11-21 NOTE — Assessment & Plan Note (Signed)
stable overall by history and exam, recent data reviewed with pt, and pt to continue medical treatment as before,  to f/u any worsening symptoms or concerns Lab Results  Component Value Date   HGBA1C 7.0* 08/22/2013    

## 2013-12-09 NOTE — Telephone Encounter (Signed)
Left message for pt to call back  °

## 2013-12-11 DIAGNOSIS — G894 Chronic pain syndrome: Secondary | ICD-10-CM | POA: Diagnosis not present

## 2013-12-11 DIAGNOSIS — M47896 Other spondylosis, lumbar region: Secondary | ICD-10-CM | POA: Diagnosis not present

## 2013-12-16 ENCOUNTER — Other Ambulatory Visit: Payer: Self-pay | Admitting: Orthopaedic Surgery

## 2013-12-16 DIAGNOSIS — M47816 Spondylosis without myelopathy or radiculopathy, lumbar region: Secondary | ICD-10-CM

## 2013-12-17 DIAGNOSIS — M545 Low back pain: Secondary | ICD-10-CM | POA: Diagnosis not present

## 2013-12-17 DIAGNOSIS — M961 Postlaminectomy syndrome, not elsewhere classified: Secondary | ICD-10-CM | POA: Diagnosis not present

## 2013-12-19 NOTE — Telephone Encounter (Signed)
The pt has scheduled follow-up with Dr Burt Knack in March 2016.

## 2013-12-26 ENCOUNTER — Encounter (HOSPITAL_COMMUNITY): Payer: Self-pay | Admitting: Cardiovascular Disease

## 2013-12-26 DIAGNOSIS — M47816 Spondylosis without myelopathy or radiculopathy, lumbar region: Secondary | ICD-10-CM | POA: Diagnosis not present

## 2014-01-03 ENCOUNTER — Ambulatory Visit (INDEPENDENT_AMBULATORY_CARE_PROVIDER_SITE_OTHER): Payer: Medicare Other | Admitting: Internal Medicine

## 2014-01-03 ENCOUNTER — Encounter: Payer: Self-pay | Admitting: Internal Medicine

## 2014-01-03 VITALS — BP 112/58 | HR 79 | Temp 98.1°F | Resp 16 | Ht 66.0 in | Wt 225.0 lb

## 2014-01-03 DIAGNOSIS — J069 Acute upper respiratory infection, unspecified: Secondary | ICD-10-CM

## 2014-01-03 DIAGNOSIS — I2510 Atherosclerotic heart disease of native coronary artery without angina pectoris: Secondary | ICD-10-CM

## 2014-01-03 MED ORDER — AZITHROMYCIN 250 MG PO TABS: ORAL_TABLET | ORAL | Status: DC

## 2014-01-03 MED ORDER — FLUTICASONE PROPIONATE 50 MCG/ACT NA SUSP: 2.0000 | Freq: Every day | NASAL | Status: DC

## 2014-01-03 NOTE — Progress Notes (Signed)
   Subjective:    Patient ID: [name], female    DOB: 04/22/1945, 68 y.o.   MRN: [id number]  HPI The patient is a 68 year old female comes in today for cold symptoms. She is having ear pain and pressure without hearing loss. She is having some muscle aches. She denies fevers or chills. Having nasal congestion and drainage. Denies chest pain, shortness of breath, wheezing. This is been going on for about 1 week.  Review of Systems  Constitutional: Negative for fever, chills, activity change, appetite change, fatigue and unexpected weight change.  HENT: Positive for congestion, ear discharge, ear pain, facial swelling, postnasal drip and sore throat. Negative for sneezing, tinnitus and voice change.   Respiratory: Positive for cough. Negative for chest tightness, shortness of breath and wheezing.   Cardiovascular: Negative for chest pain, palpitations and leg swelling.  Gastrointestinal: Negative for abdominal pain and abdominal distention.  Musculoskeletal: Positive for myalgias.  Neurological: Negative.       Objective:   Physical Exam  Constitutional: She is oriented to person, place, and time. She appears well-developed and well-nourished.  HENT:  Head: Normocephalic and atraumatic.  Right Ear: External ear normal.  Left Ear: External ear normal.  Nose with erythema and yellow crusting, throat with mild erythema.   Eyes: EOM are normal.  Neck: Normal range of motion. No JVD present.  Cardiovascular: Normal rate and regular rhythm.   Pulmonary/Chest: Effort normal and breath sounds normal. No respiratory distress. She has no wheezes. She has no rales.  Abdominal: Soft. Bowel sounds are normal.  Neurological: She is alert and oriented to person, place, and time.   Filed Vitals:   01/03/14 1307  BP: 112/58  Pulse: 79  Temp: 98.1 F (36.7 C)  TempSrc: Oral  Resp: 16  Height: 5\' 6"  (1.676 m)  Weight: 225 lb (102.059 kg)  SpO2: 97%      Assessment & Plan:

## 2014-01-03 NOTE — Assessment & Plan Note (Signed)
Rx for flonase and azithromycin.

## 2014-01-03 NOTE — Patient Instructions (Signed)
We will get an antibiotic called a azithromycin. On day one take 2 pills. Days 2 through 5 take 1 pill daily.  The other thing we are giving you is called Flonase. Do two sprays each nostril daily for the next 2 weeks.  Upper Respiratory Infection, Adult An upper respiratory infection (URI) is also sometimes known as the common cold. The upper respiratory tract includes the nose, sinuses, throat, trachea, and bronchi. Bronchi are the airways leading to the lungs. Most people improve within 1 week, but symptoms can last up to 2 weeks. A residual cough may last even longer.  CAUSES Many different viruses can infect the tissues lining the upper respiratory tract. The tissues become irritated and inflamed and often become very moist. Mucus production is also common. A cold is contagious. You can easily spread the virus to others by oral contact. This includes kissing, sharing a glass, coughing, or sneezing. Touching your mouth or nose and then touching a surface, which is then touched by another person, can also spread the virus. SYMPTOMS  Symptoms typically develop 1 to 3 days after you come in contact with a cold virus. Symptoms vary from person to person. They may include:  Runny nose.  Sneezing.  Nasal congestion.  Sinus irritation.  Sore throat.  Loss of voice (laryngitis).  Cough.  Fatigue.  Muscle aches.  Loss of appetite.  Headache.  Low-grade fever. DIAGNOSIS  You might diagnose your own cold based on familiar symptoms, since most people get a cold 2 to 3 times a year. Your caregiver can confirm this based on your exam. Most importantly, your caregiver can check that your symptoms are not due to another disease such as strep throat, sinusitis, pneumonia, asthma, or epiglottitis. Blood tests, throat tests, and X-rays are not necessary to diagnose a common cold, but they may sometimes be helpful in excluding other more serious diseases. Your caregiver will decide if any further  tests are required. RISKS AND COMPLICATIONS  You may be at risk for a more severe case of the common cold if you smoke cigarettes, have chronic heart disease (such as heart failure) or lung disease (such as asthma), or if you have a weakened immune system. The very young and very old are also at risk for more serious infections. Bacterial sinusitis, middle ear infections, and bacterial pneumonia can complicate the common cold. The common cold can worsen asthma and chronic obstructive pulmonary disease (COPD). Sometimes, these complications can require emergency medical care and may be life-threatening. PREVENTION  The best way to protect against getting a cold is to practice good hygiene. Avoid oral or hand contact with people with cold symptoms. Wash your hands often if contact occurs. There is no clear evidence that vitamin C, vitamin E, echinacea, or exercise reduces the chance of developing a cold. However, it is always recommended to get plenty of rest and practice good nutrition. TREATMENT  Treatment is directed at relieving symptoms. There is no cure. Antibiotics are not effective, because the infection is caused by a virus, not by bacteria. Treatment may include:  Increased fluid intake. Sports drinks offer valuable electrolytes, sugars, and fluids.  Breathing heated mist or steam (vaporizer or shower).  Eating chicken soup or other clear broths, and maintaining good nutrition.  Getting plenty of rest.  Using gargles or lozenges for comfort.  Controlling fevers with ibuprofen or acetaminophen as directed by your caregiver.  Increasing usage of your inhaler if you have asthma. Zinc gel and zinc lozenges, taken  in the first 24 hours of the common cold, can shorten the duration and lessen the severity of symptoms. Pain medicines may help with fever, muscle aches, and throat pain. A variety of non-prescription medicines are available to treat congestion and runny nose. Your caregiver can  make recommendations and may suggest nasal or lung inhalers for other symptoms.  HOME CARE INSTRUCTIONS   Only take over-the-counter or prescription medicines for pain, discomfort, or fever as directed by your caregiver.  Use a warm mist humidifier or inhale steam from a shower to increase air moisture. This may keep secretions moist and make it easier to breathe.  Drink enough water and fluids to keep your urine clear or pale yellow.  Rest as needed.  Return to work when your temperature has returned to normal or as your caregiver advises. You may need to stay home longer to avoid infecting others. You can also use a face mask and careful hand washing to prevent spread of the virus. SEEK MEDICAL CARE IF:   After the first few days, you feel you are getting worse rather than better.  You need your caregiver's advice about medicines to control symptoms.  You develop chills, worsening shortness of breath, or [place] or red sputum. These may be signs of pneumonia.  You develop yellow or Sledge nasal discharge or pain in the face, especially when you bend forward. These may be signs of sinusitis.  You develop a fever, swollen neck glands, pain with swallowing, or white areas in the back of your throat. These may be signs of strep throat. SEEK IMMEDIATE MEDICAL CARE IF:   You have a fever.  You develop severe or persistent headache, ear pain, sinus pain, or chest pain.  You develop wheezing, a prolonged cough, cough up blood, or have a change in your usual mucus (if you have chronic lung disease).  You develop sore muscles or a stiff neck. Document Released: 06/29/2000 Document Revised: 03/28/2011 Document Reviewed: 04/10/2013 [facility] Patient Information 2015 [place], [place]. This information is not intended to replace advice given to you by your health care provider. Make sure you discuss any questions you have with your health care provider.

## 2014-01-03 NOTE — Progress Notes (Signed)
Pre visit review using our clinic review tool, if applicable. No additional management support is needed unless otherwise documented below in the visit note. 

## 2014-01-06 ENCOUNTER — Encounter: Payer: Self-pay | Admitting: Family

## 2014-01-07 ENCOUNTER — Other Ambulatory Visit: Payer: Self-pay

## 2014-01-07 ENCOUNTER — Ambulatory Visit (HOSPITAL_COMMUNITY)
Admission: RE | Admit: 2014-01-07 | Discharge: 2014-01-07 | Disposition: A | Discharge status: Home / Self Care | Payer: Medicare Other | Source: Ambulatory Visit | Attending: Family | Admitting: Family

## 2014-01-07 ENCOUNTER — Ambulatory Visit (INDEPENDENT_AMBULATORY_CARE_PROVIDER_SITE_OTHER): Payer: Medicare Other | Admitting: Family

## 2014-01-07 ENCOUNTER — Encounter: Payer: Self-pay | Admitting: Family

## 2014-01-07 VITALS — BP 103/66 | HR 71 | Resp 16 | Ht 66.0 in | Wt 221.0 lb

## 2014-01-07 DIAGNOSIS — I714 Abdominal aortic aneurysm, without rupture, unspecified: Secondary | ICD-10-CM

## 2014-01-07 DIAGNOSIS — I2510 Atherosclerotic heart disease of native coronary artery without angina pectoris: Secondary | ICD-10-CM

## 2014-01-07 DIAGNOSIS — Z720 Tobacco use: Secondary | ICD-10-CM | POA: Diagnosis not present

## 2014-01-07 DIAGNOSIS — Z48812 Encounter for surgical aftercare following surgery on the circulatory system: Secondary | ICD-10-CM | POA: Diagnosis not present

## 2014-01-07 DIAGNOSIS — F17200 Nicotine dependence, unspecified, uncomplicated: Secondary | ICD-10-CM

## 2014-01-07 NOTE — Patient Instructions (Addendum)
Abdominal Aortic Aneurysm An aneurysm is a weakened or damaged part of an artery wall that bulges from the normal force of blood pumping through the body. An abdominal aortic aneurysm is an aneurysm that occurs in the lower part of the aorta, the main artery of the body.  The major concern with an abdominal aortic aneurysm is that it can enlarge and burst (rupture) or blood can flow between the layers of the wall of the aorta through a tear (aorticdissection). Both of these conditions can cause bleeding inside the body and can be life threatening unless diagnosed and treated promptly. CAUSES  The exact cause of an abdominal aortic aneurysm is unknown. Some contributing factors are:   A hardening of the arteries caused by the buildup of fat and other substances in the lining of a blood vessel (arteriosclerosis).  Inflammation of the walls of an artery (arteritis).   Connective tissue diseases, such as Marfan syndrome.   Abdominal trauma.   An infection, such as syphilis or staphylococcus, in the wall of the aorta (infectious aortitis) caused by bacteria. RISK FACTORS  Risk factors that contribute to an abdominal aortic aneurysm may include:  Age older than 60 years.   High blood pressure (hypertension).  Female gender.  Ethnicity (white race).  Obesity.  Family history of aneurysm (first degree relatives only).  Tobacco use. PREVENTION  The following healthy lifestyle habits may help decrease your risk of abdominal aortic aneurysm:  Quitting smoking. Smoking can raise your blood pressure and cause arteriosclerosis.  Limiting or avoiding alcohol.  Keeping your blood pressure, blood sugar level, and cholesterol levels within normal limits.  Decreasing your salt intake. In somepeople, too much salt can raise blood pressure and increase your risk of abdominal aortic aneurysm.  Eating a diet low in saturated fats and cholesterol.  Increasing your fiber intake by including  whole grains, vegetables, and fruits in your diet. Eating these foods may help lower blood pressure.  Maintaining a healthy weight.  Staying physically active and exercising regularly. SYMPTOMS  The symptoms of abdominal aortic aneurysm may vary depending on the size and rate of growth of the aneurysm.Most grow slowly and do not have any symptoms. When symptoms do occur, they may include:  Pain (abdomen, side, lower back, or groin). The pain may vary in intensity. A sudden onset of severe pain may indicate that the aneurysm has ruptured.  Feeling full after eating only small amounts of food.  Nausea or vomiting or both.  Feeling a pulsating lump in the abdomen.  Feeling faint or passing out. DIAGNOSIS  Since most unruptured abdominal aortic aneurysms have no symptoms, they are often discovered during diagnostic exams for other conditions. An aneurysm may be found during the following procedures:  Ultrasonography (A one-time screening for abdominal aortic aneurysm by ultrasonography is also recommended for all men aged 65-75 years who have ever smoked).  X-ray exams.  A computed tomography (CT).  Magnetic resonance imaging (MRI).  Angiography or arteriography. TREATMENT  Treatment of an abdominal aortic aneurysm depends on the size of your aneurysm, your age, and risk factors for rupture. Medication to control blood pressure and pain may be used to manage aneurysms smaller than 6 cm. Regular monitoring for enlargement may be recommended by your caregiver if:  The aneurysm is 3-4 cm in size (an annual ultrasonography may be recommended).  The aneurysm is 4-4.5 cm in size (an ultrasonography every 6 months may be recommended).  The aneurysm is larger than 4.5 cm in   size (your caregiver may ask that you be examined by a vascular surgeon). If your aneurysm is larger than 6 cm, surgical repair may be recommended. There are two main methods for repair of an aneurysm:   Endovascular  repair (a minimally invasive surgery). This is done most often.  Open repair. This method is used if an endovascular repair is not possible. Document Released: 10/13/2004 Document Revised: 04/30/2012 Document Reviewed: 02/03/2012 [employer] Patient Information 2015 [employer]. This information is not intended to replace advice given to you by your health care provider. Make sure you discuss any questions you have with your health care provider.   Smoking Cessation Quitting smoking is important to your health and has many advantages. However, it is not always easy to quit since nicotine is a very addictive drug. Oftentimes, people try 3 times or more before being able to quit. This document explains the best ways for you to prepare to quit smoking. Quitting takes hard work and a lot of effort, but you can do it. ADVANTAGES OF QUITTING SMOKING  You will live longer, feel better, and live better.  Your body will feel the impact of quitting smoking almost immediately.  Within 20 minutes, blood pressure decreases. Your pulse returns to its normal level.  After 8 hours, carbon monoxide levels in the blood return to normal. Your oxygen level increases.  After 24 hours, the chance of having a heart attack starts to decrease. Your breath, hair, and body stop smelling like smoke.  After 48 hours, damaged nerve endings begin to recover. Your sense of taste and smell improve.  After 72 hours, the body is virtually free of nicotine. Your bronchial tubes relax and breathing becomes easier.  After 2 to 12 weeks, lungs can hold more air. Exercise becomes easier and circulation improves.  The risk of having a heart attack, stroke, cancer, or lung disease is greatly reduced.  After 1 year, the risk of coronary heart disease is cut in half.  After 5 years, the risk of stroke falls to the same as a nonsmoker.  After 10 years, the risk of lung cancer is cut in half and the risk of other cancers  decreases significantly.  After 15 years, the risk of coronary heart disease drops, usually to the level of a nonsmoker.  If you are pregnant, quitting smoking will improve your chances of having a healthy baby.  The people you live with, especially any children, will be healthier.  You will have extra money to spend on things other than cigarettes. QUESTIONS TO THINK ABOUT BEFORE ATTEMPTING TO QUIT You may want to talk about your answers with your health care provider.  Why do you want to quit?  If you tried to quit in the past, what helped and what did not?  What will be the most difficult situations for you after you quit? How will you plan to handle them?  Who can help you through the tough times? Your family? Friends? A health care provider?  What pleasures do you get from smoking? What ways can you still get pleasure if you quit? Here are some questions to ask your health care provider:  How can you help me to be successful at quitting?  What medicine do you think would be best for me and how should I take it?  What should I do if I need more help?  What is smoking withdrawal like? How can I get information on withdrawal? GET READY  Set a quit   date.  Change your environment by getting rid of all cigarettes, ashtrays, matches, and lighters in your home, car, or work. Do not let people smoke in your home.  Review your past attempts to quit. Think about what worked and what did not. GET SUPPORT AND ENCOURAGEMENT You have a better chance of being successful if you have help. You can get support in many ways.  Tell your family, friends, and coworkers that you are going to quit and need their support. Ask them not to smoke around you.  Get individual, group, or telephone counseling and support. Programs are available at local hospitals and health centers. Call your local health department for information about programs in your area.  Spiritual beliefs and practices may  help some smokers quit.  Download a "quit meter" on your computer to keep track of quit statistics, such as how long you have gone without smoking, cigarettes not smoked, and money saved.  Get a self-help book about quitting smoking and staying off tobacco. LEARN NEW SKILLS AND BEHAVIORS  Distract yourself from urges to smoke. Talk to someone, go for a walk, or occupy your time with a task.  Change your normal routine. Take a different route to work. Drink tea instead of coffee. Eat breakfast in a different place.  Reduce your stress. Take a hot bath, exercise, or read a book.  Plan something enjoyable to do every day. Reward yourself for not smoking.  Explore interactive web-based programs that specialize in helping you quit. GET MEDICINE AND USE IT CORRECTLY Medicines can help you stop smoking and decrease the urge to smoke. Combining medicine with the above behavioral methods and support can greatly increase your chances of successfully quitting smoking.  Nicotine replacement therapy helps deliver nicotine to your body without the negative effects and risks of smoking. Nicotine replacement therapy includes nicotine gum, lozenges, inhalers, nasal sprays, and skin patches. Some may be available over-the-counter and others require a prescription.  Antidepressant medicine helps people abstain from smoking, but how this works is unknown. This medicine is available by prescription.  Nicotinic receptor partial agonist medicine simulates the effect of nicotine in your brain. This medicine is available by prescription. Ask your health care provider for advice about which medicines to use and how to use them based on your health history. Your health care provider will tell you what side effects to look out for if you choose to be on a medicine or therapy. Carefully read the information on the package. Do not use any other product containing nicotine while using a nicotine replacement product.    RELAPSE OR DIFFICULT SITUATIONS Most relapses occur within the first 3 months after quitting. Do not be discouraged if you start smoking again. Remember, most people try several times before finally quitting. You may have symptoms of withdrawal because your body is used to nicotine. You may crave cigarettes, be irritable, feel very hungry, cough often, get headaches, or have difficulty concentrating. The withdrawal symptoms are only temporary. They are strongest when you first quit, but they will go away within 10-14 days. To reduce the chances of relapse, try to:  Avoid drinking alcohol. Drinking lowers your chances of successfully quitting.  Reduce the amount of caffeine you consume. Once you quit smoking, the amount of caffeine in your body increases and can give you symptoms, such as a rapid heartbeat, sweating, and anxiety.  Avoid smokers because they can make you want to smoke.  Do not let weight gain distract you. Many   smokers will gain weight when they quit, usually less than 10 pounds. Eat a healthy diet and stay active. You can always lose the weight gained after you quit.  Find ways to improve your mood other than smoking. FOR MORE INFORMATION  www.smokefree.gov  Document Released: 12/28/2000 Document Revised: 05/20/2013 Document Reviewed: 04/14/2011 [employer] Patient Information 2015 [employer]. This information is not intended to replace advice given to you by your health care provider. Make sure you discuss any questions you have with your health care provider.   Smoking Cessation, Tips for Success If you are ready to quit smoking, congratulations! You have chosen to help yourself be healthier. Cigarettes bring nicotine, tar, carbon monoxide, and other irritants into your body. Your lungs, heart, and blood vessels will be able to work better without these poisons. There are many different ways to quit smoking. Nicotine gum, nicotine patches, a nicotine inhaler, or nicotine  nasal spray can help with physical craving. Hypnosis, support groups, and medicines help break the habit of smoking. WHAT THINGS CAN I DO TO MAKE QUITTING EASIER?  Here are some tips to help you quit for good:  Pick a date when you will quit smoking completely. Tell all of your friends and family about your plan to quit on that date.  Do not try to slowly cut down on the number of cigarettes you are smoking. Pick a quit date and quit smoking completely starting on that day.  Throw away all cigarettes.   Clean and remove all ashtrays from your home, work, and car.  On a card, write down your reasons for quitting. Carry the card with you and read it when you get the urge to smoke.  Cleanse your body of nicotine. Drink enough water and fluids to keep your urine clear or pale yellow. Do this after quitting to flush the nicotine from your body.  Learn to predict your moods. Do not let a bad situation be your excuse to have a cigarette. Some situations in your life might tempt you into wanting a cigarette.  Never have "just one" cigarette. It leads to wanting another and another. Remind yourself of your decision to quit.  Change habits associated with smoking. If you smoked while driving or when feeling stressed, try other activities to replace smoking. Stand up when drinking your coffee. Brush your teeth after eating. Sit in a different chair when you read the paper. Avoid alcohol while trying to quit, and try to drink fewer caffeinated beverages. Alcohol and caffeine may urge you to smoke.  Avoid foods and drinks that can trigger a desire to smoke, such as sugary or spicy foods and alcohol.  Ask people who smoke not to smoke around you.  Have something planned to do right after eating or having a cup of coffee. For example, plan to take a walk or exercise.  Try a relaxation exercise to calm you down and decrease your stress. Remember, you may be tense and nervous for the first 2 weeks after  you quit, but this will pass.  Find new activities to keep your hands busy. Play with a pen, coin, or rubber band. Doodle or draw things on paper.  Brush your teeth right after eating. This will help cut down on the craving for the taste of tobacco after meals. You can also try mouthwash.   Use oral substitutes in place of cigarettes. Try using lemon drops, carrots, cinnamon sticks, or chewing gum. Keep them handy so they are available when you   have the urge to smoke.  When you have the urge to smoke, try deep breathing.  Designate your home as a nonsmoking area.  If you are a heavy smoker, ask your health care provider about a prescription for nicotine chewing gum. It can ease your withdrawal from nicotine.  Reward yourself. Set aside the cigarette money you save and buy yourself something nice.  Look for support from others. Join a support group or smoking cessation program. Ask someone at home or at work to help you with your plan to quit smoking.  Always ask yourself, "Do I need this cigarette or is this just a reflex?" Tell yourself, "Today, I choose not to smoke," or "I do not want to smoke." You are reminding yourself of your decision to quit.  Do not replace cigarette smoking with electronic cigarettes (commonly called e-cigarettes). The safety of e-cigarettes is unknown, and some may contain harmful chemicals.  If you relapse, do not give up! Plan ahead and think about what you will do the next time you get the urge to smoke. HOW WILL I FEEL WHEN I QUIT SMOKING? You may have symptoms of withdrawal because your body is used to nicotine (the addictive substance in cigarettes). You may crave cigarettes, be irritable, feel very hungry, cough often, get headaches, or have difficulty concentrating. The withdrawal symptoms are only temporary. They are strongest when you first quit but will go away within 10-14 days. When withdrawal symptoms occur, stay in control. Think about your reasons  for quitting. Remind yourself that these are signs that your body is healing and getting used to being without cigarettes. Remember that withdrawal symptoms are easier to treat than the major diseases that smoking can cause.  Even after the withdrawal is over, expect periodic urges to smoke. However, these cravings are generally short lived and will go away whether you smoke or not. Do not smoke! WHAT RESOURCES ARE AVAILABLE TO HELP ME QUIT SMOKING? Your health care provider can direct you to community resources or hospitals for support, which may include:  Group support.  Education.  Hypnosis.  Therapy. Document Released: 10/02/2003 Document Revised: 05/20/2013 Document Reviewed: 06/21/2012 [employer] Patient Information 2015 [employer]. This information is not intended to replace advice given to you by your health care provider. Make sure you discuss any questions you have with your health care provider.   

## 2014-01-07 NOTE — Progress Notes (Signed)
[facility] [place]  Established Abdominal Aortic Aneurysm  History of Present Illness  [name] is a 68 y.o. (March 02, 1945) female patient of Dr. Donnetta Hutching. The patient presents today for continued followup of known infrarenal abdominal aortic aneurysm.  She does have some chronic degenerative disc disease with back pain, with recent exacerbation, relieved by sitting, but no new abdominal pain. Pt states she just had an MRI of her back. Previous studies demonstrate an AAA, measuring 4.7 cm.  The patient denies claudication in legs with walking, denies non healing wounds. The patient denies history of stroke or TIA symptoms. She had a cardiac stent placed recently. Takes ASA 4 days/week. Her cardiologist is Dr. Burt Knack, pt states she tried Brilinta as prescribed by Dr. Burt Knack, also tried Plavix, states both medications "were messin' with me" and pt stopped taking, pt states she told the cardiologist office that she stopped Brilinta.   Pt Diabetic: Yes, A1C August 2015 was 7.0 Pt smoker: former smoker (quit in 2013), admits that she occassionally smokes when stressed    Past Medical History  Diagnosis Date  . Coronary artery disease     a. s/p inferior MI 1998->PCI of RCA. b. S/p prior PCI of the OM1 and LAD;  c. 05/2013 Cath/PCI: LM nl, LAD 90p (3.0x22 Resolute DES), 35m w/ patent stent, Diag 40p, 61m, LCX patent OM1 stent, RCA 50p, 43m, EF 65%.  . Hyperlipidemia   . Hypertension   . Obesity, unspecified   . Hypothyroidism   . Diabetes mellitus     TYPE II  . Anxiety state, unspecified   . Depressive disorder, not elsewhere classified   . Degeneration of lumbar or lumbosacral intervertebral disc   . Allergic rhinitis, cause unspecified   . Lower GI bleed 06/2010    Diverticular bleed  . Chronic LBP   . AAA (abdominal aortic aneurysm)     a. 4.7cm in 08/2012. Followed by [facility].  . Gout 08/22/2013   Past Surgical History  Procedure Laterality Date  . Total  abdominal hysterectomy    . Lumbar fusion  01/2007    DR. Patrice Paradise...3-LEVEL WITH FIXATION  . Parathyroidectomy    . Thyroidectomy    . Cardiac catheterization      PCI OF BOTH THE CIRCUMFLEX AND LEFT ANTERIOR DESCENDING ARTERY  . Cesarean section    . Heart stent  04-2010  and  Jun 07, 2013    X 3  . Spine surgery    . Left heart catheterization with coronary angiogram N/A 06/07/2013    Procedure: LEFT HEART CATHETERIZATION WITH CORONARY ANGIOGRAM;  Surgeon: [name], MD;  Location: [facility] CATH LAB;  Service: Cardiovascular;  Laterality: N/A;   Social History History   Social History  . Marital Status: Divorced    Spouse Name: N/A    Number of Children: 3  . Years of Education: N/A   Occupational History  . [employer]  .     Social History Main Topics  . Smoking status: Former Smoker -- 30 years    Types: Cigarettes    Quit date: 05/31/1986  . Smokeless tobacco: Never Used  . Alcohol Use: No  . Drug Use: No  . Sexual Activity: Not on file   Other Topics Concern  . Not on file   Social History Narrative   DIVORCED   3 CHILDREN   PATIENT SIGNED A DESIGNATED PARTY RELEASE TO ALLOW HER DAUGHTER, TRAMAINE  Heikkila, TO HAVE ACCESS TO HER MEDICAL RECORDS/INFORMATION. [name], May 04, 2009 @ 3:27 PM   Smokes cigarettes on rare occasions.   Family History Family History  Problem Relation Age of Onset  . Heart attack Mother 23    s/p D&C-CARDIAC ARREST 1966  . Heart disease Mother   . Heart attack Father 97    1978 WITH MI  . Heart disease Father   . Colon cancer Neg Hx   . Diabetes Brother   . Diabetes Maternal Aunt   . Arthritis Maternal Aunt   . Cancer Maternal Aunt     Current Outpatient Prescriptions on File Prior to Visit  Medication Sig Dispense Refill  . allopurinol (ZYLOPRIM) 300 MG tablet Take 1 tablet (300 mg total) by mouth daily. 90 tablet 3  . aspirin EC 81 MG tablet Take 1 tablet (81 mg total) by mouth  daily. For 1 month.  Then resume taking 4 times a week (tue thur sat sun)    . atenolol (TENORMIN) 25 MG tablet Take 1 tablet (25 mg total) by mouth 2 (two) times daily. 180 tablet 3  . azithromycin (ZITHROMAX) 250 MG tablet Day one take 2 pills. Days 2 through 5 take 1 pill daily. 6 tablet 0  . cetirizine (ZYRTEC) 10 MG chewable tablet Chew 10 mg by mouth daily as needed for allergies.    . fluconazole (DIFLUCAN) 150 MG tablet 1 tab by mouth every 3 days as needed 2 tablet 1  . fluticasone (FLONASE) 50 MCG/ACT nasal spray Place 2 sprays into both nostrils daily. 16 g 6  . gabapentin (NEURONTIN) 300 MG capsule Take 1 capsule (300 mg total) by mouth 3 (three) times daily. 90 capsule 5  . HYDROcodone-homatropine (HYCODAN) 5-1.5 MG/5ML syrup Take 5 mLs by mouth every 6 (six) hours as needed for cough. 180 mL 0  . hydrOXYzine (ATARAX/VISTARIL) 10 MG tablet Take 1 tablet (10 mg total) by mouth 3 (three) times daily as needed for itching. 90 tablet 1  . meloxicam (MOBIC) 7.5 MG tablet Take 1 tablet (7.5 mg total) by mouth daily. 7 tablet 0  . metFORMIN (GLUCOPHAGE-XR) 500 MG 24 hr tablet Take 4 tablets (2,000 mg total) by mouth daily with breakfast.    . nitroGLYCERIN (NITROSTAT) 0.4 MG SL tablet Place 1 tablet (0.4 mg total) under the tongue every 5 (five) minutes as needed. 25 tablet 1  . oxyCODONE (OXY IR/ROXICODONE) 5 MG immediate release tablet 1-2 tabs every 6 hours as needed for pain, with limit 4 tabs per day - 120 tablet 0  . SYNTHROID 150 MCG tablet TAKE 1 TABLET BY MOUTH ONCE DAILY 90 tablet 0  . ticagrelor (BRILINTA) 90 MG TABS tablet Take 1 tablet (90 mg total) by mouth 2 (two) times daily. 60 tablet 6  . tiZANidine (ZANAFLEX) 4 MG tablet Take 1 tablet (4 mg total) by mouth every 6 (six) hours as needed for muscle spasms. 40 tablet 1  . traMADol (ULTRAM) 50 MG tablet Take 1 tablet (50 mg total) by mouth every 6 (six) hours as needed for pain. 60 tablet 1  . rosuvastatin (CRESTOR) 20 MG tablet  Take 1 tablet (20 mg total) by mouth daily. 90 tablet 3   No current facility-administered medications on file prior to visit.   Allergies  Allergen Reactions  . Doxycycline     REACTION: gi upset  . Miconazole Nitrate     REACTION: hives  . Prilosec [Omeprazole] Other (See Comments)    Chest  pain  . Augmentin [Amoxicillin-Pot Clavulanate] Hives, Itching and Rash    ROS: See HPI for pertinent positives and negatives.  Physical Examination  Filed Vitals:   01/07/14 1120  BP: 103/66  Pulse: 71  Resp: 16  Height: 5\' 6"  (1.676 m)  Weight: 221 lb (100.245 kg)  SpO2: 98%   Body mass index is 35.69 kg/(m^2).  General: A&O x 3, WD, obese female.  Pulmonary: Sym exp, good air movt, CTAB, no rales, rhonchi, or wheezing.  Cardiac: RRR, Nl S1, S2, no detected murmur.   Carotid Bruits Left Right   Negative Negative  Aorta is not palpable Radial pulses are 2+ palpable and =   VASCULAR EXAM:     LE Pulses LEFT RIGHT   FEMORAL 2+ palpable 2+ palpable    POPLITEAL not palpable  not palpable   POSTERIOR TIBIAL not palpable  not palpable    DORSALIS PEDIS  ANTERIOR TIBIAL 2+ palpable  2+ palpable     Gastrointestinal: soft, NTND, -G/R, - HSM, - palpable masses, - CVAT B.  Musculoskeletal: M/S 4/5 throughout, Extremities without ischemic changes.  Neurologic: CN 2-12 intact, Pain and light touch intact in extremities are intact except, Motor exam as listed above.   Non-Invasive Vascular Imaging  AAA Duplex (01/07/2014) ABDOMINAL AORTA DUPLEX EVALUATION    INDICATION: Evaluation of Known abdominal aortic aneurysm    PREVIOUS INTERVENTION(S):     DUPLEX EXAM: Technically difficult exam due to significant overlying bowel gas.  Patient denied receiving dietary  restrictions instructions.    LOCATION DIAMETER AP (cm) DIAMETER TRANSVERSE (cm) VELOCITIES (cm/sec)  Aorta Proximal 2.8 2.8 59  Aorta Mid 3.3 3.3 NV  Aorta Distal 4.4 4.1 55  Right Common Iliac Artery 1.3 1.3 130  Left Common Iliac Artery 1.3 1.3 85    Previous max aortic diameter:  4.7 x 4.6 cm Date: 06-25-2013     ADDITIONAL FINDINGS: Significant overlying bowel gas, patient did not follow diet instructions.    IMPRESSION: Abdominal aortic aneurysm measuring 4.0 x 4.1 in the distal segment.     Compared to the previous exam:  No significant changes noted. Measurements obtained are smaller than previous exam, however, this may be due to difficulty in obtaining optimal images.     Medical Decision Making  The patient is a 68 y.o. female who presents with asymptomatic AAA with no increase in size. Significant overlying bowel gas, patient did not follow diet instructions. No significant changes noted. Measurements obtained are smaller than previous exam, however, this may be due to difficulty in obtaining optimal images.  Unfortunately the patient occassionally smokes cigarettes.  The patient was counseled re smoking cessation and given several free resources re smoking cessation.  Her DM is in control with an A1C of 7.0.   Based on this patient's exam and diagnostic studies, the patient will follow up in 6 months  with the following studies: AAA Duplex.  Consideration for repair of AAA would be made when the size approaches 4.8 or 5.0 cm, growth > 1 cm/yr, and symptomatic status.  I emphasized the importance of maximal medical management including strict control of blood pressure, blood glucose, and lipid levels, antiplatelet agents, obtaining regular exercise, and cessation of smoking.   The patient was given information about AAA including signs, symptoms, treatment, and how to minimize the risk of enlargement and rupture of aneurysms.    The patient was advised to call [phone]  should the patient experience sudden onset abdominal or back pain.  Thank you for allowing [place] to participate in this patient's care.  Clemon Chambers, RN, MSN, FNP-C [facility] [place] Office: [phone]  Clinic Physician: Early  01/07/2014, 11:41 AM

## 2014-01-14 ENCOUNTER — Ambulatory Visit (INDEPENDENT_AMBULATORY_CARE_PROVIDER_SITE_OTHER): Payer: Medicare Other | Admitting: Internal Medicine

## 2014-01-14 ENCOUNTER — Encounter: Payer: Self-pay | Admitting: Internal Medicine

## 2014-01-14 VITALS — BP 112/68 | HR 81 | Temp 98.5°F | Ht 66.0 in | Wt 220.0 lb

## 2014-01-14 DIAGNOSIS — I2510 Atherosclerotic heart disease of native coronary artery without angina pectoris: Secondary | ICD-10-CM

## 2014-01-14 DIAGNOSIS — I10 Essential (primary) hypertension: Secondary | ICD-10-CM

## 2014-01-14 DIAGNOSIS — M10471 Other secondary gout, right ankle and foot: Secondary | ICD-10-CM

## 2014-01-14 DIAGNOSIS — E11311 Type 2 diabetes mellitus with unspecified diabetic retinopathy with macular edema: Secondary | ICD-10-CM | POA: Diagnosis not present

## 2014-01-14 MED ORDER — KETOROLAC TROMETHAMINE 30 MG/ML IJ SOLN
30.0000 mg | Freq: Once | INTRAMUSCULAR | Status: AC
  Administered 2014-01-14: 30 mg via INTRAMUSCULAR

## 2014-01-14 MED ORDER — PREDNISONE 10 MG PO TABS: ORAL_TABLET | ORAL | Status: DC

## 2014-01-14 MED ORDER — METHYLPREDNISOLONE ACETATE 80 MG/ML IJ SUSP
80.0000 mg | Freq: Once | INTRAMUSCULAR | Status: AC
Start: 2014-01-14 — End: 2014-01-14
  Administered 2014-01-14: 80 mg via INTRAMUSCULAR

## 2014-01-14 MED ORDER — ALLOPURINOL 300 MG PO TABS: 300.0000 mg | ORAL_TABLET | Freq: Every day | ORAL | Status: DC

## 2014-01-14 NOTE — Assessment & Plan Note (Signed)
stable overall by history and exam, recent data reviewed with pt, and pt to continue medical treatment as before,  to f/u any worsening symptoms or concerns BP Readings from Last 3 Encounters:  01/14/14 112/68  01/07/14 103/66  01/03/14 112/58

## 2014-01-14 NOTE — Assessment & Plan Note (Signed)
stable overall by history and exam, recent data reviewed with pt, and pt to continue medical treatment as before,  to f/u any worsening symptoms or concerns Lab Results  Component Value Date   HGBA1C 7.0* 08/22/2013   To f/u any worsening with steroid tx

## 2014-01-14 NOTE — Assessment & Plan Note (Signed)
Recurrence, for toradol/depomedrol IM today, predpac asd, re-start allopurinol after prednisone,  to f/u any worsening symptoms or concerns

## 2014-01-14 NOTE — Progress Notes (Signed)
Subjective:    Patient ID: [name], female    DOB: 02-11-1945, 68 y.o.   MRN: [id number]  HPI Here to f/u, has been out of allopurinol for several months, now with acute onset 3-4 days right > left ankle severe pain and swelling, left minor though and resolved, but right ispersistent,  No recent trauma, fever. + hx of gout.  Pt denies chest pain, increased sob or doe, wheezing, orthopnea, PND, increased LE swelling, palpitations, dizziness or syncope.  Pt denies new neurological symptoms such as new headache, or facial or extremity weakness or numbness   Pt denies polydipsia, polyuria,   Past Medical History  Diagnosis Date  . Coronary artery disease     a. s/p inferior MI 1998->PCI of RCA. b. S/p prior PCI of the OM1 and LAD;  c. 05/2013 Cath/PCI: LM nl, LAD 90p (3.0x22 Resolute DES), 74m w/ patent stent, Diag 40p, 75m, LCX patent OM1 stent, RCA 50p, 45m, EF 65%.  . Hyperlipidemia   . Hypertension   . Obesity, unspecified   . Hypothyroidism   . Diabetes mellitus     TYPE II  . Anxiety state, unspecified   . Depressive disorder, not elsewhere classified   . Degeneration of lumbar or lumbosacral intervertebral disc   . Allergic rhinitis, cause unspecified   . Lower GI bleed 06/2010    Diverticular bleed  . Chronic LBP   . AAA (abdominal aortic aneurysm)     a. 4.7cm in 08/2012. Followed by [facility].  . Gout 08/22/2013   Past Surgical History  Procedure Laterality Date  . Total abdominal hysterectomy    . Lumbar fusion  01/2007    DR. Patrice Paradise...3-LEVEL WITH FIXATION  . Parathyroidectomy    . Thyroidectomy    . Cardiac catheterization      PCI OF BOTH THE CIRCUMFLEX AND LEFT ANTERIOR DESCENDING ARTERY  . Cesarean section    . Heart stent  04-2010  and  Jun 07, 2013    X 3  . Spine surgery    . Left heart catheterization with coronary angiogram N/A 06/07/2013    Procedure: LEFT HEART CATHETERIZATION WITH CORONARY ANGIOGRAM;  Surgeon: [name], MD;  Location: [facility] CATH LAB;   Service: Cardiovascular;  Laterality: N/A;    reports that she quit smoking about 27 years ago. Her smoking use included Cigarettes. She smoked 0.00 packs per day for 30 years. She has never used smokeless tobacco. She reports that she does not drink alcohol or use illicit drugs. family history includes Arthritis in her maternal aunt; Cancer in her maternal aunt; Diabetes in her brother and maternal aunt; Heart attack (age of onset: 75) in her mother; Heart attack (age of onset: 60) in her father; Heart disease in her father and mother. There is no history of Colon cancer. Allergies  Allergen Reactions  . Doxycycline     REACTION: gi upset  . Miconazole Nitrate     REACTION: hives  . Prilosec [Omeprazole] Other (See Comments)    Chest pain  . Augmentin [Amoxicillin-Pot Clavulanate] Hives, Itching and Rash   Current Outpatient Prescriptions on File Prior to Visit  Medication Sig Dispense Refill  . allopurinol (ZYLOPRIM) 300 MG tablet Take 1 tablet (300 mg total) by mouth daily. 90 tablet 3  . aspirin EC 81 MG tablet Take 1 tablet (81 mg total) by mouth daily. For 1 month.  Then resume taking 4 times a week (tue thur sat sun)    . atenolol (TENORMIN)  25 MG tablet Take 1 tablet (25 mg total) by mouth 2 (two) times daily. 180 tablet 3  . azithromycin (ZITHROMAX) 250 MG tablet Day one take 2 pills. Days 2 through 5 take 1 pill daily. 6 tablet 0  . cetirizine (ZYRTEC) 10 MG chewable tablet Chew 10 mg by mouth daily as needed for allergies.    . fluconazole (DIFLUCAN) 150 MG tablet 1 tab by mouth every 3 days as needed 2 tablet 1  . fluticasone (FLONASE) 50 MCG/ACT nasal spray Place 2 sprays into both nostrils daily. 16 g 6  . gabapentin (NEURONTIN) 300 MG capsule Take 1 capsule (300 mg total) by mouth 3 (three) times daily. 90 capsule 5  . HYDROcodone-homatropine (HYCODAN) 5-1.5 MG/5ML syrup Take 5 mLs by mouth every 6 (six) hours as needed for cough. 180 mL 0  . hydrOXYzine (ATARAX/VISTARIL) 10  MG tablet Take 1 tablet (10 mg total) by mouth 3 (three) times daily as needed for itching. 90 tablet 1  . meloxicam (MOBIC) 7.5 MG tablet Take 1 tablet (7.5 mg total) by mouth daily. 7 tablet 0  . metFORMIN (GLUCOPHAGE-XR) 500 MG 24 hr tablet Take 4 tablets (2,000 mg total) by mouth daily with breakfast.    . nitroGLYCERIN (NITROSTAT) 0.4 MG SL tablet Place 1 tablet (0.4 mg total) under the tongue every 5 (five) minutes as needed. 25 tablet 1  . oxyCODONE (OXY IR/ROXICODONE) 5 MG immediate release tablet 1-2 tabs every 6 hours as needed for pain, with limit 4 tabs per day - 120 tablet 0  . SYNTHROID 150 MCG tablet TAKE 1 TABLET BY MOUTH ONCE DAILY 90 tablet 0  . ticagrelor (BRILINTA) 90 MG TABS tablet Take 1 tablet (90 mg total) by mouth 2 (two) times daily. 60 tablet 6  . tiZANidine (ZANAFLEX) 4 MG tablet Take 1 tablet (4 mg total) by mouth every 6 (six) hours as needed for muscle spasms. 40 tablet 1  . traMADol (ULTRAM) 50 MG tablet Take 1 tablet (50 mg total) by mouth every 6 (six) hours as needed for pain. 60 tablet 1  . rosuvastatin (CRESTOR) 20 MG tablet Take 1 tablet (20 mg total) by mouth daily. 90 tablet 3   No current facility-administered medications on file prior to visit.    Review of Systems  Constitutional: Negative for unusual diaphoresis or other sweats  HENT: Negative for ringing in ear Eyes: Negative for double vision or worsening visual disturbance.  Respiratory: Negative for choking and stridor.   Gastrointestinal: Negative for vomiting or other signifcant bowel change Genitourinary: Negative for hematuria or decreased urine volume.  Musculoskeletal: Negative for other MSK pain or swelling Skin: Negative for color change and worsening wound.  Neurological: Negative for tremors and numbness other than noted  Psychiatric/Behavioral: Negative for decreased concentration or agitation other than above       Objective:   Physical Exam BP 112/68 mmHg  Pulse 81  Temp(Src)  98.5 F (36.9 C) (Oral)  Ht 5\' 6"  (1.676 m)  Wt 220 lb (99.791 kg)  BMI 35.53 kg/m2  SpO2 95% VS noted,  Constitutional: Pt appears well-developed, well-nourished.  HENT: Head: NCAT.  Right Ear: External ear normal.  Left Ear: External ear normal.  Eyes: . Pupils are equal, round, and reactive to light. Conjunctivae and EOM are normal Neck: Normal range of motion. Neck supple.  Cardiovascular: Normal rate and regular rhythm.   Pulmonary/Chest: Effort normal and breath sounds without rales or wheezing.  Neurological: Pt is alert. Not confused ,  motor grossly intact Skin: Skin is warm. No rash Psychiatric: Pt behavior is normal. No agitation.  Right ankle with 2-3+ swelling/tender     Assessment & Plan:

## 2014-01-14 NOTE — Patient Instructions (Signed)
You had the steroid shot today, and the pain shot (toradol)  Please take all new medication as prescribed - the prednisone  Please re-start the allopurinol after the prednisone is finished  Please continue all other medications as before  Please have the pharmacy call with any other refills you may need.  Please keep your appointments with your specialists as you may have planned

## 2014-01-14 NOTE — Progress Notes (Signed)
Pre visit review using our clinic review tool, if applicable. No additional management support is needed unless otherwise documented below in the visit note. 

## 2014-01-16 ENCOUNTER — Telehealth: Payer: Self-pay | Admitting: Internal Medicine

## 2014-01-16 NOTE — Telephone Encounter (Signed)
Called pt gave her md instructions on prednisone. Pt was wanting to know can she take all 3 pills at what time. Inform pt yes she can take at one time...[name]

## 2014-01-16 NOTE — Telephone Encounter (Signed)
Needs instruction on how to take prednisone

## 2014-01-22 ENCOUNTER — Encounter: Payer: Self-pay | Admitting: Internal Medicine

## 2014-02-04 DIAGNOSIS — M545 Low back pain: Secondary | ICD-10-CM | POA: Diagnosis not present

## 2014-02-04 DIAGNOSIS — M961 Postlaminectomy syndrome, not elsewhere classified: Secondary | ICD-10-CM | POA: Diagnosis not present

## 2014-02-04 DIAGNOSIS — M47816 Spondylosis without myelopathy or radiculopathy, lumbar region: Secondary | ICD-10-CM | POA: Diagnosis not present

## 2014-02-08 ENCOUNTER — Other Ambulatory Visit: Payer: Self-pay | Admitting: Internal Medicine

## 2014-02-24 ENCOUNTER — Emergency Department (HOSPITAL_COMMUNITY): Payer: Medicare Other

## 2014-02-24 ENCOUNTER — Encounter (HOSPITAL_COMMUNITY): Payer: Self-pay

## 2014-02-24 ENCOUNTER — Inpatient Hospital Stay (HOSPITAL_COMMUNITY)
Admission: EM | Admit: 2014-02-24 | Discharge: 2014-02-26 | DRG: 287 | Disposition: A | Discharge status: Home / Self Care | Payer: Medicare Other | Attending: Cardiovascular Disease | Admitting: Cardiovascular Disease

## 2014-02-24 DIAGNOSIS — Z6836 Body mass index (BMI) 36.0-36.9, adult: Secondary | ICD-10-CM

## 2014-02-24 DIAGNOSIS — Z79899 Other long term (current) drug therapy: Secondary | ICD-10-CM

## 2014-02-24 DIAGNOSIS — F419 Anxiety disorder, unspecified: Secondary | ICD-10-CM | POA: Diagnosis present

## 2014-02-24 DIAGNOSIS — F411 Generalized anxiety disorder: Secondary | ICD-10-CM | POA: Diagnosis present

## 2014-02-24 DIAGNOSIS — Z90710 Acquired absence of both cervix and uterus: Secondary | ICD-10-CM

## 2014-02-24 DIAGNOSIS — I714 Abdominal aortic aneurysm, without rupture, unspecified: Secondary | ICD-10-CM | POA: Diagnosis present

## 2014-02-24 DIAGNOSIS — E782 Mixed hyperlipidemia: Secondary | ICD-10-CM | POA: Diagnosis present

## 2014-02-24 DIAGNOSIS — Z87891 Personal history of nicotine dependence: Secondary | ICD-10-CM

## 2014-02-24 DIAGNOSIS — Z9114 Patient's other noncompliance with medication regimen: Secondary | ICD-10-CM | POA: Diagnosis not present

## 2014-02-24 DIAGNOSIS — E785 Hyperlipidemia, unspecified: Secondary | ICD-10-CM

## 2014-02-24 DIAGNOSIS — M109 Gout, unspecified: Secondary | ICD-10-CM | POA: Diagnosis present

## 2014-02-24 DIAGNOSIS — Z7982 Long term (current) use of aspirin: Secondary | ICD-10-CM

## 2014-02-24 DIAGNOSIS — Z888 Allergy status to other drugs, medicaments and biological substances status: Secondary | ICD-10-CM

## 2014-02-24 DIAGNOSIS — E039 Hypothyroidism, unspecified: Secondary | ICD-10-CM | POA: Diagnosis present

## 2014-02-24 DIAGNOSIS — I2510 Atherosclerotic heart disease of native coronary artery without angina pectoris: Secondary | ICD-10-CM | POA: Diagnosis present

## 2014-02-24 DIAGNOSIS — E119 Type 2 diabetes mellitus without complications: Secondary | ICD-10-CM

## 2014-02-24 DIAGNOSIS — Z9119 Patient's noncompliance with other medical treatment and regimen: Secondary | ICD-10-CM | POA: Diagnosis present

## 2014-02-24 DIAGNOSIS — Z79891 Long term (current) use of opiate analgesic: Secondary | ICD-10-CM

## 2014-02-24 DIAGNOSIS — I252 Old myocardial infarction: Secondary | ICD-10-CM

## 2014-02-24 DIAGNOSIS — I209 Angina pectoris, unspecified: Secondary | ICD-10-CM | POA: Diagnosis not present

## 2014-02-24 DIAGNOSIS — E669 Obesity, unspecified: Secondary | ICD-10-CM | POA: Diagnosis not present

## 2014-02-24 DIAGNOSIS — R079 Chest pain, unspecified: Principal | ICD-10-CM | POA: Diagnosis present

## 2014-02-24 DIAGNOSIS — Z881 Allergy status to other antibiotic agents status: Secondary | ICD-10-CM

## 2014-02-24 DIAGNOSIS — Z91148 Patient's other noncompliance with medication regimen for other reason: Secondary | ICD-10-CM

## 2014-02-24 DIAGNOSIS — M25512 Pain in left shoulder: Secondary | ICD-10-CM | POA: Diagnosis not present

## 2014-02-24 DIAGNOSIS — Z955 Presence of coronary angioplasty implant and graft: Secondary | ICD-10-CM

## 2014-02-24 DIAGNOSIS — I10 Essential (primary) hypertension: Secondary | ICD-10-CM | POA: Diagnosis present

## 2014-02-24 DIAGNOSIS — R072 Precordial pain: Secondary | ICD-10-CM | POA: Diagnosis present

## 2014-02-24 DIAGNOSIS — I25110 Atherosclerotic heart disease of native coronary artery with unstable angina pectoris: Secondary | ICD-10-CM

## 2014-02-24 HISTORY — DX: Patient's other noncompliance with medication regimen: Z91.14

## 2014-02-24 LAB — I-STAT TROPONIN, ED
Troponin i, poc: 0 ng/mL (ref 0.00–0.08)
Troponin i, poc: 0 ng/mL (ref 0.00–0.08)

## 2014-02-24 LAB — BASIC METABOLIC PANEL
ANION GAP: 10 (ref 5–15)
BUN: 8 mg/dL (ref 6–23)
CO2: 23 mmol/L (ref 19–32)
Calcium: 8.9 mg/dL (ref 8.4–10.5)
Chloride: 109 mmol/L (ref 96–112)
Creatinine, Ser: 0.84 mg/dL (ref 0.50–1.10)
GFR calc Af Amer: 81 mL/min — ABNORMAL LOW (ref >90)
GFR calc non Af Amer: 70 mL/min — ABNORMAL LOW (ref >90)
GLUCOSE: 114 mg/dL — AB (ref 70–99)
Potassium: 4.3 mmol/L (ref 3.5–5.1)
SODIUM: 142 mmol/L (ref 135–145)

## 2014-02-24 LAB — CBC
HCT: 44.9 % (ref 36.0–46.0)
HEMOGLOBIN: 14.8 g/dL (ref 12.0–15.0)
MCH: 28.4 pg (ref 26.0–34.0)
MCHC: 33 g/dL (ref 30.0–36.0)
MCV: 86 fL (ref 78.0–100.0)
PLATELETS: 274 10*3/uL (ref 150–400)
RBC: 5.22 MIL/uL — ABNORMAL HIGH (ref 3.87–5.11)
RDW: 14.6 % (ref 11.5–15.5)
WBC: 8 10*3/uL (ref 4.0–10.5)

## 2014-02-24 MED ORDER — HEPARIN (PORCINE) IN NACL 100-0.45 UNIT/ML-% IJ SOLN
1250.0000 [IU]/h | INTRAMUSCULAR | Status: DC
  Administered 2014-02-24: 950 [IU]/h via INTRAVENOUS
  Administered 2014-02-25: 1250 [IU]/h via INTRAVENOUS
  Filled 2014-02-24 (×3): qty 250

## 2014-02-24 MED ORDER — KETOROLAC TROMETHAMINE 15 MG/ML IJ SOLN
15.0000 mg | Freq: Once | INTRAMUSCULAR | Status: AC
  Administered 2014-02-24: 15 mg via INTRAVENOUS
  Filled 2014-02-24: qty 1

## 2014-02-24 MED ORDER — HEPARIN BOLUS VIA INFUSION
4000.0000 [IU] | Freq: Once | INTRAVENOUS | Status: AC
  Administered 2014-02-24: 4000 [IU] via INTRAVENOUS
  Filled 2014-02-24: qty 4000

## 2014-02-24 MED ORDER — MORPHINE SULFATE 4 MG/ML IJ SOLN
4.0000 mg | Freq: Once | INTRAMUSCULAR | Status: AC
  Administered 2014-02-24: 4 mg via INTRAVENOUS
  Filled 2014-02-24: qty 1

## 2014-02-24 NOTE — H&P (Signed)
[name] is an 69 y.o. female.   Chief Complaint: Chest pain HPI:   69 year old obese female, who was last seen by Dr. Burt Knack in March 2015, with a history of coronary artery disease with previous stenting of the LAD and left circumflex. History also includes hypertension, hypothyroidism, diabetes mellitus type 2, anxiety. Her heart catheterization in 2012 demonstrated total occlusion of the left circumflex distal to the previously stented segment. She was treated with a bare-metal stent. She otherwise was found to have patency of the LAD and moderate stenosis in the right coronary artery with medical therapy recommended.  Her last cath was May 2015 and revealed severe stenosis in the proximal LAD.  This was stented with a resolute DES.  Normal LVF.  She was  Last lipid panel below.  The patient also has a small moderate sized abdominal aortic aneurysm followed by Dr. early. On most recent evaluation it was 4.2 cm in maximal dimension.  Patient presents with chest pain. The patient reports left shoulder pain which comes and goes for the last two weeks.  She describes it as throbbing.  She also reports off and on chest pain which is a "hard, sharp" pain.  12/10 with SOB, diaphoresis.  One week ago she took a SL NTG which took the pain away.  Currently pain free.  She sleep on three pillows because that's what she has always done.  The patient currently denies nausea, vomiting, fever, orthopnea, dizziness, PND, cough, congestion, abdominal pain, hematochezia, melena, lower extremity edema.    She reports taking the plavix for about three months and then brilinta for about a week and then stopping it because of the way it made her feel.  Dr. Angelena Form recommended life-long DAPT.  Troponin is negative 2. No acute EKG changes.  Pain free currently.  She reports the last time she had a Lexiscan she was hollering and thought she was going to die.    Medications: Medication Sig  allopurinol (ZYLOPRIM) 300 MG  tablet Take 1 tablet (300 mg total) by mouth daily. Patient not taking: Reported on 02/24/2014  aspirin EC 81 MG tablet Take 1 tablet (81 mg total) by mouth daily. For 1 month.  Then resume taking 4 times a week (tue thur sat sun)  atenolol (TENORMIN) 25 MG tablet Take 1 tablet (25 mg total) by mouth 2 (two) times daily.  cetirizine (ZYRTEC) 10 MG chewable tablet Chew 10 mg by mouth daily as needed for allergies.  fluconazole (DIFLUCAN) 150 MG tablet 1 tab by mouth every 3 days as needed  fluticasone (FLONASE) 50 MCG/ACT nasal spray Place 2 sprays into both nostrils daily.  gabapentin (NEURONTIN) 300 MG capsule Take 1 capsule (300 mg total) by mouth 3 (three) times daily.  HYDROcodone-homatropine (HYCODAN) 5-1.5 MG/5ML syrup Take 5 mLs by mouth every 6 (six) hours as needed for cough.  hydrOXYzine (ATARAX/VISTARIL) 10 MG tablet Take 1 tablet (10 mg total) by mouth 3 (three) times daily as needed for itching.  meloxicam (MOBIC) 7.5 MG tablet Take 1 tablet (7.5 mg total) by mouth daily.  metFORMIN (GLUCOPHAGE-XR) 500 MG 24 hr tablet Take 4 tablets (2,000 mg total) by mouth daily with breakfast.  nitroGLYCERIN (NITROSTAT) 0.4 MG SL tablet Place 1 tablet (0.4 mg total) under the tongue every 5 (five) minutes as needed.  oxyCODONE (OXY IR/ROXICODONE) 5 MG immediate release tablet 1-2 tabs every 6 hours as needed for pain, with limit 4 tabs per day -  predniSONE (DELTASONE) 10 MG tablet  3 tabs by mouth per day for 3 days,2tabs per day for 3 days,1tab per day for 3 days  rosuvastatin (CRESTOR) 20 MG tablet Take 1 tablet (20 mg total) by mouth daily.  SYNTHROID 150 MCG tablet TAKE 1 TABLET BY MOUTH EVERY DAY  ticagrelor (BRILINTA) 90 MG TABS tablet Take 1 tablet (90 mg total) by mouth 2 (two) times daily.  tiZANidine (ZANAFLEX) 4 MG tablet Take 1 tablet (4 mg total) by mouth every 6 (six) hours as needed for muscle spasms.  traMADol (ULTRAM) 50 MG tablet Take 1 tablet (50 mg total) by mouth every 6 (six)  hours as needed for pain.    Lipid Panel     Component Value Date/Time   CHOL 184 08/22/2013 1451   TRIG 170.0* 08/22/2013 1451   TRIG 102 01/02/2006 0825   HDL 32.00* 08/22/2013 1451   CHOLHDL 6 08/22/2013 1451   CHOLHDL 3.3 CALC 01/02/2006 0825   VLDL 34.0 08/22/2013 1451   LDLCALC 118* 08/22/2013 1451   LDLDIRECT 73.1 02/27/2012 1122      Past Medical History  Diagnosis Date  . Coronary artery disease     a. s/p inferior MI 1998->PCI of RCA. b. S/p prior PCI of the OM1 and LAD;  c. 05/2013 Cath/PCI: LM nl, LAD 90p (3.0x22 Resolute DES), 4mw/ patent stent, Diag 40p, 792mLCX patent OM1 stent, RCA 50p, 401mF 65%.  . Hyperlipidemia   . Hypertension   . Obesity, unspecified   . Hypothyroidism   . Diabetes mellitus     TYPE II  . Anxiety state, unspecified   . Depressive disorder, not elsewhere classified   . Degeneration of lumbar or lumbosacral intervertebral disc   . Allergic rhinitis, cause unspecified   . Lower GI bleed 06/2010    Diverticular bleed  . Chronic LBP   . AAA (abdominal aortic aneurysm)     a. 4.7cm in 08/2012. Followed by [facility].  . Gout 08/22/2013    Past Surgical History  Procedure Laterality Date  . Total abdominal hysterectomy    . Lumbar fusion  01/2007    DR. COHPatrice Paradise3-LEVEL WITH FIXATION  . Parathyroidectomy    . Thyroidectomy    . Cardiac catheterization      PCI OF BOTH THE CIRCUMFLEX AND LEFT ANTERIOR DESCENDING ARTERY  . Cesarean section    . Heart stent  04-2010  and  Jun 07, 2013    X 3  . Spine surgery    . Left heart catheterization with coronary angiogram N/A 06/07/2013    Procedure: LEFT HEART CATHETERIZATION WITH CORONARY ANGIOGRAM;  Surgeon: ChrBurnell BlanksD;  [facility];  Service: Cardiovascular;  Laterality: N/A;    Family History  Problem Relation Age of Onset  . Heart attack Mother 40 41 s/p D&C-CARDIAC ARREST 1966  . Heart disease Mother   . Heart attack Father 52 79 1978 WITH MI  . Heart disease  Father   . Colon cancer Neg Hx   . Diabetes Brother   . Diabetes Maternal Aunt   . Arthritis Maternal Aunt   . Cancer Maternal Aunt    Social History:  reports that she quit smoking about 27 years ago. Her smoking use included Cigarettes. She quit after 30 years of use. She has never used smokeless tobacco. She reports that she does not drink alcohol or use illicit drugs.  Allergies:  Allergies  Allergen Reactions  . Doxycycline     REACTION: gi  upset  . Miconazole Nitrate     REACTION: hives  . Prilosec [Omeprazole] Other (See Comments)    Chest pain  . Augmentin [Amoxicillin-Pot Clavulanate] Hives, Itching and Rash     (Not in a hospital admission)  Results for orders placed or performed during the hospital encounter of 02/24/14 (from the past 48 hour(s))  CBC     Status: Abnormal   Collection Time: 02/24/14 11:51 AM  Result Value Ref Range   WBC 8.0 4.0 - 10.5 K/uL   RBC 5.22 (H) 3.87 - 5.11 MIL/uL   Hemoglobin 14.8 12.0 - 15.0 g/dL   HCT 44.9 36.0 - 46.0 %   MCV 86.0 78.0 - 100.0 fL   MCH 28.4 26.0 - 34.0 pg   MCHC 33.0 30.0 - 36.0 g/dL   RDW 14.6 11.5 - 15.5 %   Platelets 274 150 - 400 K/uL  Basic metabolic panel     Status: Abnormal   Collection Time: 02/24/14 11:51 AM  Result Value Ref Range   Sodium 142 135 - 145 mmol/L   Potassium 4.3 3.5 - 5.1 mmol/L   Chloride 109 96 - 112 mmol/L   CO2 23 19 - 32 mmol/L   Glucose, Bld 114 (H) 70 - 99 mg/dL   BUN 8 6 - 23 mg/dL   Creatinine, Ser 0.84 0.50 - 1.10 mg/dL   Calcium 8.9 8.4 - 10.5 mg/dL   GFR calc non Af Amer 70 (L) >90 mL/min   GFR calc Af Amer 81 (L) >90 mL/min    Comment: (NOTE) The eGFR has been calculated using the CKD EPI equation. This calculation has not been validated in all clinical situations. eGFR's persistently <90 mL/min signify possible Chronic Kidney Disease.    Anion gap 10 5 - 15  I-stat troponin, ED (not at [facility])     Status: None   Collection Time: 02/24/14 12:04 PM  Result Value Ref  Range   Troponin i, poc 0.00 0.00 - 0.08 ng/mL   Comment 3            Comment: Due to the release kinetics of cTnI, a negative result within the first hours of the onset of symptoms does not rule out myocardial infarction with certainty. If myocardial infarction is still suspected, repeat the test at appropriate intervals.   I-stat troponin, ED     Status: None   Collection Time: 02/24/14  3:05 PM  Result Value Ref Range   Troponin i, poc 0.00 0.00 - 0.08 ng/mL   Comment 3            Comment: Due to the release kinetics of cTnI, a negative result within the first hours of the onset of symptoms does not rule out myocardial infarction with certainty. If myocardial infarction is still suspected, repeat the test at appropriate intervals.    Dg Chest 2 View  02/24/2014   CLINICAL DATA:  Left chest and left arm pain for 2 weeks.  EXAM: CHEST  2 VIEW  COMPARISON:  PA and lateral chest 06/06/2013.  FINDINGS: The lungs are clear. Heart size is normal. No pneumothorax or pleural effusion. The aorta is mildly tortuous. Degenerative change is seen about the shoulders and thoracic spine.  IMPRESSION: No acute disease.   Electronically Signed   By: Inge Rise M.D.   On: 02/24/2014 12:58    Review of Systems  Constitutional: Positive for diaphoresis. Negative for fever.  HENT: Negative for congestion and sore throat.   Respiratory: Positive for  shortness of breath. Negative for cough.   Cardiovascular: Positive for chest pain. Negative for orthopnea, leg swelling and PND.  Gastrointestinal: Negative for nausea, vomiting, abdominal pain, blood in stool and melena.  Genitourinary: Negative for dysuria.  Musculoskeletal: Positive for myalgias (left shoulder pain).  Neurological: Negative for dizziness.  All other systems reviewed and are negative.   Blood pressure 127/81, pulse 70, temperature 98.2 F (36.8 C), temperature source Oral, resp. rate 14, height '5\' 6"'  (1.676 m), weight 226 lb  (102.513 kg), SpO2 100 %. Physical Exam  Nursing note and vitals reviewed. Constitutional: She is oriented to person, place, and time. She appears well-developed. No distress.  Obese   HENT:  Head: Normocephalic and atraumatic.  Mouth/Throat: No oropharyngeal exudate.  Eyes: EOM are normal. Pupils are equal, round, and reactive to light. No scleral icterus.  Neck: Normal range of motion. Neck supple.  Cardiovascular: Normal rate, regular rhythm, S1 normal and S2 normal.   No murmur heard. Pulses:      Radial pulses are 2+ on the right side, and 2+ on the left side.       Dorsalis pedis pulses are 2+ on the right side, and 2+ on the left side.  No carotid bruits  Respiratory: Effort normal and breath sounds normal. She has no wheezes. She has no rales.  GI: Soft. Bowel sounds are normal. She exhibits no distension. There is no tenderness.  Musculoskeletal: She exhibits no edema.  Lymphadenopathy:    She has no cervical adenopathy.  Neurological: She is alert and oriented to person, place, and time. She exhibits normal muscle tone.  Skin: Skin is warm and dry.  Psychiatric: She has a normal mood and affect.     Assessment/Plan Active Problems:   Hyperlipidemia   Obesity   Essential hypertension   Coronary atherosclerosis  Plan:  69 year old obese female, who was last seen by Dr. Burt Knack in March 2015, with a history of coronary artery disease with previous stenting of the LAD and left circumflex.  History also includes hypertension, hypothyroidism, diabetes mellitus type 2, anxiety.  Last cath May 2015 with proximal LAD DES.  Stopped DAPT after three months and even that period is questionable.  She presents with NTG responsive CP and left shoulder pain.  The two do not correspond.  Troponin is negative.  No acute EKG changes.    We will admit and cycle troponin.  I think she needs a LHC.  If stenting is needed, use a bare metal stent.  She does not understand the importance of  taking the DAPT which was recommended as life-long.  She is against doing a [employer] study because she felt so terrible during the last one.  Tarri Fuller, [place] 02/24/2014, 4:55 PM   I have seen and examined the patient along with HAGER, BRYAN, [place].  I have reviewed the chart, notes and new data.  I agree with PA/NP's note.  Key new complaints: describes atypical and typical components to her chest pain, noncompliance with antiplatelets Key examination changes: no arrhythmia or CHF Key new findings / data: low risk ECG and enzymes  PLAN: She is completely mistrustful of stress tests. Noncompliant with antiplatelet meds/ Her shoulder pain is clearly noncardiac, but her chest tightness is more concerning. Recommend coronary angio. Reviewed the potentially fatal complications of noncompliance with DAPT and stent thrombosis.  Sanda Klein, MD, [place] [phone] 02/24/2014, 7:04 PM

## 2014-02-24 NOTE — ED Provider Notes (Signed)
CSN: [id number]     Arrival date & time 02/24/14  1131 History   First MD Initiated Contact with Patient 02/24/14 1220     Chief Complaint  Patient presents with  . Shoulder Pain  . Chest Pain     (Consider location/radiation/quality/duration/timing/severity/associated sxs/prior Treatment) Patient is a 69 y.o. female presenting with shoulder pain and chest pain. The history is provided by the patient.  Shoulder Pain Associated symptoms: neck pain   Associated symptoms: no back pain   Chest Pain Associated symptoms: no abdominal pain, no back pain, no headache, no nausea, no numbness, no shortness of breath, not vomiting and no weakness    patient was pain in her left shoulder and left neck. Has been going for the last couple weeks. Comes and goes. Not associated with exertion. States she previously had pain in her left breast and that was a 90% blockage in her heart. She has not had the same chest pain in her breasts since she had a stent 9 months ago. No fevers. No numbness weakness. No trauma. The pain does not come on with exertion. The pain is somewhat sharp. She states she has been using green alcohol on it that does not help. No neck pain. No cough. No shortness of breath.  Past Medical History  Diagnosis Date  . Coronary artery disease     a. s/p inferior MI 1998->PCI of RCA. b. S/p prior PCI of the OM1 and LAD;  c. 05/2013 Cath/PCI: LM nl, LAD 90p (3.0x22 Resolute DES), 106m w/ patent stent, Diag 40p, 96m, LCX patent OM1 stent, RCA 50p, 37m, EF 65%.  . Hyperlipidemia   . Hypertension   . Obesity, unspecified   . Hypothyroidism   . Diabetes mellitus     TYPE II  . Anxiety state, unspecified   . Depressive disorder, not elsewhere classified   . Degeneration of lumbar or lumbosacral intervertebral disc   . Allergic rhinitis, cause unspecified   . Lower GI bleed 06/2010    Diverticular bleed  . Chronic LBP   . AAA (abdominal aortic aneurysm)     a. 4.7cm in 08/2012. Followed by  [facility].  . Gout 08/22/2013   Past Surgical History  Procedure Laterality Date  . Total abdominal hysterectomy    . Lumbar fusion  01/2007    DR. Patrice Paradise...3-LEVEL WITH FIXATION  . Parathyroidectomy    . Thyroidectomy    . Cardiac catheterization      PCI OF BOTH THE CIRCUMFLEX AND LEFT ANTERIOR DESCENDING ARTERY  . Cesarean section    . Heart stent  04-2010  and  Jun 07, 2013    X 3  . Spine surgery    . Left heart catheterization with coronary angiogram N/A 06/07/2013    Procedure: LEFT HEART CATHETERIZATION WITH CORONARY ANGIOGRAM;  Surgeon: [name], MD;  [facility];  Service: Cardiovascular;  Laterality: N/A;   Family History  Problem Relation Age of Onset  . Heart attack Mother 69    s/p D&C-CARDIAC ARREST 1966  . Heart disease Mother   . Heart attack Father 20    1978 WITH MI  . Heart disease Father   . Colon cancer Neg Hx   . Diabetes Brother   . Diabetes Maternal Aunt   . Arthritis Maternal Aunt   . Cancer Maternal Aunt    History  Substance Use Topics  . Smoking status: Former Smoker -- 30 years    Types: Cigarettes    Quit  date: 05/31/1986  . Smokeless tobacco: Never Used  . Alcohol Use: No   OB History    No data available     Review of Systems  Constitutional: Negative for activity change and appetite change.  Eyes: Negative for pain.  Respiratory: Negative for chest tightness and shortness of breath.   Cardiovascular: Positive for chest pain. Negative for leg swelling.  Gastrointestinal: Negative for nausea, vomiting, abdominal pain and diarrhea.  Genitourinary: Negative for flank pain.  Musculoskeletal: Positive for neck pain. Negative for back pain and neck stiffness.  Skin: Negative for rash.  Neurological: Negative for weakness, numbness and headaches.  Psychiatric/Behavioral: Negative for behavioral problems.      Allergies  Doxycycline; Miconazole nitrate; Prilosec; and Augmentin  Home Medications   Prior to Admission  medications   Medication Sig Start Date End Date Taking? Authorizing Provider  allopurinol (ZYLOPRIM) 300 MG tablet Take 1 tablet (300 mg total) by mouth daily. Patient not taking: Reported on 02/24/2014 01/14/14   Biagio Borg, MD  aspirin EC 81 MG tablet Take 1 tablet (81 mg total) by mouth daily. For 1 month.  Then resume taking 4 times a week (tue thur sat sun) 06/25/13   Liliane Shi, PA-C  atenolol (TENORMIN) 25 MG tablet Take 1 tablet (25 mg total) by mouth 2 (two) times daily. 02/21/13   Biagio Borg, MD  cetirizine (ZYRTEC) 10 MG chewable tablet Chew 10 mg by mouth daily as needed for allergies.    Historical Provider, MD  fluconazole (DIFLUCAN) 150 MG tablet 1 tab by mouth every 3 days as needed 09/13/13   Biagio Borg, MD  fluticasone [facility]) 50 MCG/ACT nasal spray Place 2 sprays into both nostrils daily. 01/03/14   Olga Millers, MD  gabapentin (NEURONTIN) 300 MG capsule Take 1 capsule (300 mg total) by mouth 3 (three) times daily. 08/22/13   Biagio Borg, MD  HYDROcodone-homatropine [facility]) 5-1.5 MG/5ML syrup Take 5 mLs by mouth every 6 (six) hours as needed for cough. 09/25/13   Biagio Borg, MD  hydrOXYzine (ATARAX/VISTARIL) 10 MG tablet Take 1 tablet (10 mg total) by mouth 3 (three) times daily as needed for itching. 11/22/12   Biagio Borg, MD  meloxicam (MOBIC) 7.5 MG tablet Take 1 tablet (7.5 mg total) by mouth daily. 08/03/13   Debbrah Alar, NP  metFORMIN (GLUCOPHAGE-XR) 500 MG 24 hr tablet Take 4 tablets (2,000 mg total) by mouth daily with breakfast. 06/08/13   Rogelia Mire, NP  nitroGLYCERIN (NITROSTAT) 0.4 MG SL tablet Place 1 tablet (0.4 mg total) under the tongue every 5 (five) minutes as needed. 08/16/13   Biagio Borg, MD  oxyCODONE (OXY IR/ROXICODONE) 5 MG immediate release tablet 1-2 tabs every 6 hours as needed for pain, with limit 4 tabs per day - 11/21/13   Biagio Borg, MD  predniSONE (DELTASONE) 10 MG tablet 3 tabs by mouth per day for 3 days,2tabs per day  for 3 days,1tab per day for 3 days 01/14/14   Biagio Borg, MD  rosuvastatin (CRESTOR) 20 MG tablet Take 1 tablet (20 mg total) by mouth daily. 04/21/11 03/28/13  Biagio Borg, MD  SYNTHROID 150 MCG tablet TAKE 1 TABLET BY MOUTH EVERY DAY 02/10/14   Biagio Borg, MD  ticagrelor (BRILINTA) 90 MG TABS tablet Take 1 tablet (90 mg total) by mouth 2 (two) times daily. 07/18/13   Blane Ohara, MD  tiZANidine (ZANAFLEX) 4 MG tablet Take 1 tablet (  4 mg total) by mouth every 6 (six) hours as needed for muscle spasms. 08/13/13   Biagio Borg, MD  traMADol (ULTRAM) 50 MG tablet Take 1 tablet (50 mg total) by mouth every 6 (six) hours as needed for pain. 02/27/12   Biagio Borg, MD   BP 111/76 mmHg  Pulse 67  Temp(Src) 98.2 F (36.8 C) (Oral)  Resp 17  Ht 5\' 6"  (1.676 m)  Wt 226 lb (102.513 kg)  BMI 36.49 kg/m2  SpO2 100% Physical Exam  Constitutional: She appears well-developed and well-nourished.  HENT:  Head: Normocephalic and atraumatic.  Neck: Normal range of motion. Neck supple.  Cardiovascular: Normal rate, regular rhythm and normal heart sounds.   No murmur heard. Pulmonary/Chest: Effort normal and breath sounds normal. No respiratory distress. She has no wheezes. She has no rales. She exhibits no tenderness.  Abdominal: Soft. Bowel sounds are normal. She exhibits no distension. There is no tenderness.  Musculoskeletal: Normal range of motion.  Mild tenderness over left trapezius.  Neurological: She is alert.  Skin: Skin is warm and dry.  Psychiatric: She has a normal mood and affect. Her speech is normal.  Nursing note and vitals reviewed.   ED Course  Procedures (including critical care time) Labs Review Labs Reviewed  CBC - Abnormal; Notable for the following:    RBC 5.22 (*)    All other components within normal limits  BASIC METABOLIC PANEL - Abnormal; Notable for the following:    Glucose, Bld 114 (*)    GFR calc non Af Amer 70 (*)    GFR calc Af Amer 81 (*)    All other  components within normal limits  I-STAT TROPOININ, ED  I-STAT TROPOININ, ED    Imaging Review Dg Chest 2 View  02/24/2014   CLINICAL DATA:  Left chest and left arm pain for 2 weeks.  EXAM: CHEST  2 VIEW  COMPARISON:  PA and lateral chest 06/06/2013.  FINDINGS: The lungs are clear. Heart size is normal. No pneumothorax or pleural effusion. The aorta is mildly tortuous. Degenerative change is seen about the shoulders and thoracic spine.  IMPRESSION: No acute disease.   Electronically Signed   By: Inge Rise M.D.   On: 02/24/2014 12:58     EKG Interpretation   Date/Time:  Monday February 24 2014 11:41:54 EST Ventricular Rate:  78 PR Interval:  144 QRS Duration: 94 QT Interval:  388 QTC Calculation: 442 R Axis:   -65 Text Interpretation:  Normal sinus rhythm Left axis deviation Low voltage  QRS Incomplete right bundle branch block Cannot rule out Anterior infarct  , age undetermined Abnormal ECG No significant change since last tracing  Confirmed by Annalese Stiner  MD, Ovid Curd [phone]) on 02/24/2014 12:24:32 PM      MDM   Final diagnoses:  Chest pain, unspecified chest pain type  Left shoulder pain    Patient with pain in her left shoulder. Somewhat reproducible. She does however have a cardiac history. She states that time the pain was in her left breast but she did have some other known disease. Has had at least 2 stents. Enzymes negative 2. Patient has been constant and not especially with exertion. To be seen by cardiology.    Jasper Riling. Alvino Chapel, MD 02/24/14 1622

## 2014-02-24 NOTE — ED Notes (Signed)
Attempted report again, nurse requested report to be called back after shift change.

## 2014-02-24 NOTE — ED Notes (Signed)
Pt states she started having left shoulder and neck pain for the past 3 weeks off and on. The last time she had this pain she had a 90% blockage on the left side. Denies any N/V/ SOB.

## 2014-02-24 NOTE — ED Notes (Signed)
Patient in xray.   Radiology to bring patient to E48 when finished.

## 2014-02-24 NOTE — ED Notes (Signed)
Attempted report a 3rd time, asked to speak to charge nurse when advised that on coming nurse taking patient was still in report, charge nurse advised she would have nurse who is getting patient to call back for report.

## 2014-02-24 NOTE — ED Notes (Signed)
Attempted report, nurse unavailable, number given and nurse to call back to obtain report.

## 2014-02-24 NOTE — Progress Notes (Signed)
ANTICOAGULATION CONSULT NOTE - Initial Consult  Pharmacy Consult for heparin Indication: chest pain/ACS  Allergies  Allergen Reactions  . Doxycycline     REACTION: gi upset  . Miconazole Nitrate     REACTION: hives  . Prilosec [Omeprazole] Other (See Comments)    Chest pain  . Augmentin [Amoxicillin-Pot Clavulanate] Hives, Itching and Rash    Patient Measurements: Height: 5\' 6"  (167.6 cm) Weight: 226 lb (102.513 kg) IBW/kg (Calculated) : 59.3 Heparin Dosing Weight: 79 kg  Vital Signs: Temp: 98.2 F (36.8 C) (02/08 1148) Temp Source: Oral (02/08 1148) BP: 127/81 mmHg (02/08 1615) Pulse Rate: 70 (02/08 1615)  Labs:  Recent Labs  02/24/14 1151  HGB 14.8  HCT 44.9  PLT 274  CREATININE 0.84    Estimated Creatinine Clearance: 77.5 mL/min (by C-G formula based on Cr of 0.84).   Medical History: Past Medical History  Diagnosis Date  . Coronary artery disease     a. s/p inferior MI 1998->PCI of RCA. b. S/p prior PCI of the OM1 and LAD;  c. 05/2013 Cath/PCI: LM nl, LAD 90p (3.0x22 Resolute DES), 66m w/ patent stent, Diag 40p, 71m, LCX patent OM1 stent, RCA 50p, 84m, EF 65%.  . Hyperlipidemia   . Hypertension   . Obesity, unspecified   . Hypothyroidism   . Diabetes mellitus     TYPE II  . Anxiety state, unspecified   . Depressive disorder, not elsewhere classified   . Degeneration of lumbar or lumbosacral intervertebral disc   . Allergic rhinitis, cause unspecified   . Lower GI bleed 06/2010    Diverticular bleed  . Chronic LBP   . AAA (abdominal aortic aneurysm)     a. 4.7cm in 08/2012. Followed by [facility].  . Gout 08/22/2013    Medications:   (Not in a hospital admission)  Assessment: 69 yo F presents on 2/8 with chest pain. PMH includes CAD, HLD, HTN, obesity, DM, depression. Pharmacy to dose heparin for ACS. CBC stable, no s/s of bleed.  Goal of Therapy:   Heparin level 0.3-0.7 units/ml Monitor platelets by anticoagulation protocol: Yes   Plan:  Give 4000  units heparin BOLUS Start heparin gtt at 950 units/hr Check 6 hr HL at 0000 tomorrow Monitor daily HL, CBC, s/s of bleed F/U LHC  [name] J 02/24/2014,5:22 PM

## 2014-02-24 NOTE — ED Notes (Signed)
EDP at bedside  

## 2014-02-25 ENCOUNTER — Ambulatory Visit: Payer: Medicare Other | Admitting: Internal Medicine

## 2014-02-25 ENCOUNTER — Encounter (HOSPITAL_COMMUNITY)
Admission: EM | Disposition: A | Discharge status: Home / Self Care | Payer: Self-pay | Source: Home / Self Care | Attending: Cardiovascular Disease

## 2014-02-25 DIAGNOSIS — Z7982 Long term (current) use of aspirin: Secondary | ICD-10-CM | POA: Diagnosis not present

## 2014-02-25 DIAGNOSIS — R079 Chest pain, unspecified: Secondary | ICD-10-CM | POA: Diagnosis not present

## 2014-02-25 DIAGNOSIS — I25118 Atherosclerotic heart disease of native coronary artery with other forms of angina pectoris: Secondary | ICD-10-CM | POA: Diagnosis not present

## 2014-02-25 DIAGNOSIS — Z6836 Body mass index (BMI) 36.0-36.9, adult: Secondary | ICD-10-CM | POA: Diagnosis not present

## 2014-02-25 DIAGNOSIS — E785 Hyperlipidemia, unspecified: Secondary | ICD-10-CM | POA: Diagnosis not present

## 2014-02-25 DIAGNOSIS — Z9114 Patient's other noncompliance with medication regimen: Secondary | ICD-10-CM | POA: Diagnosis present

## 2014-02-25 DIAGNOSIS — I10 Essential (primary) hypertension: Secondary | ICD-10-CM | POA: Diagnosis not present

## 2014-02-25 DIAGNOSIS — F411 Generalized anxiety disorder: Secondary | ICD-10-CM | POA: Diagnosis present

## 2014-02-25 DIAGNOSIS — Z90710 Acquired absence of both cervix and uterus: Secondary | ICD-10-CM | POA: Diagnosis not present

## 2014-02-25 DIAGNOSIS — Z9119 Patient's noncompliance with other medical treatment and regimen: Secondary | ICD-10-CM | POA: Diagnosis present

## 2014-02-25 DIAGNOSIS — I714 Abdominal aortic aneurysm, without rupture, unspecified: Secondary | ICD-10-CM | POA: Diagnosis present

## 2014-02-25 DIAGNOSIS — E119 Type 2 diabetes mellitus without complications: Secondary | ICD-10-CM | POA: Diagnosis not present

## 2014-02-25 DIAGNOSIS — M109 Gout, unspecified: Secondary | ICD-10-CM | POA: Diagnosis present

## 2014-02-25 DIAGNOSIS — F419 Anxiety disorder, unspecified: Secondary | ICD-10-CM | POA: Diagnosis present

## 2014-02-25 DIAGNOSIS — Z79899 Other long term (current) drug therapy: Secondary | ICD-10-CM | POA: Diagnosis not present

## 2014-02-25 DIAGNOSIS — I25110 Atherosclerotic heart disease of native coronary artery with unstable angina pectoris: Secondary | ICD-10-CM | POA: Diagnosis not present

## 2014-02-25 DIAGNOSIS — Z888 Allergy status to other drugs, medicaments and biological substances status: Secondary | ICD-10-CM | POA: Diagnosis not present

## 2014-02-25 DIAGNOSIS — I252 Old myocardial infarction: Secondary | ICD-10-CM | POA: Diagnosis not present

## 2014-02-25 DIAGNOSIS — E039 Hypothyroidism, unspecified: Secondary | ICD-10-CM | POA: Diagnosis present

## 2014-02-25 DIAGNOSIS — Z79891 Long term (current) use of opiate analgesic: Secondary | ICD-10-CM | POA: Diagnosis not present

## 2014-02-25 DIAGNOSIS — E669 Obesity, unspecified: Secondary | ICD-10-CM | POA: Diagnosis not present

## 2014-02-25 DIAGNOSIS — Z881 Allergy status to other antibiotic agents status: Secondary | ICD-10-CM | POA: Diagnosis not present

## 2014-02-25 DIAGNOSIS — Z955 Presence of coronary angioplasty implant and graft: Secondary | ICD-10-CM | POA: Diagnosis not present

## 2014-02-25 DIAGNOSIS — Z87891 Personal history of nicotine dependence: Secondary | ICD-10-CM | POA: Diagnosis not present

## 2014-02-25 HISTORY — PX: LEFT HEART CATHETERIZATION WITH CORONARY ANGIOGRAM: SHX5451

## 2014-02-25 LAB — BASIC METABOLIC PANEL
ANION GAP: 13 (ref 5–15)
BUN: 14 mg/dL (ref 6–23)
CO2: 21 mmol/L (ref 19–32)
Calcium: 8.4 mg/dL (ref 8.4–10.5)
Chloride: 105 mmol/L (ref 96–112)
Creatinine, Ser: 1.08 mg/dL (ref 0.50–1.10)
GFR, EST AFRICAN AMERICAN: 60 mL/min — AB (ref >90)
GFR, EST NON AFRICAN AMERICAN: 52 mL/min — AB (ref >90)
Glucose, Bld: 163 mg/dL — ABNORMAL HIGH (ref 70–99)
Potassium: 3.7 mmol/L (ref 3.5–5.1)
SODIUM: 139 mmol/L (ref 135–145)

## 2014-02-25 LAB — CBC
HCT: 41.5 % (ref 36.0–46.0)
HEMOGLOBIN: 13.3 g/dL (ref 12.0–15.0)
MCH: 28.5 pg (ref 26.0–34.0)
MCHC: 32 g/dL (ref 30.0–36.0)
MCV: 88.9 fL (ref 78.0–100.0)
PLATELETS: 255 10*3/uL (ref 150–400)
RBC: 4.67 MIL/uL (ref 3.87–5.11)
RDW: 14.7 % (ref 11.5–15.5)
WBC: 8 10*3/uL (ref 4.0–10.5)

## 2014-02-25 LAB — TROPONIN I
Troponin I: 0.05 ng/mL — ABNORMAL HIGH (ref <0.031)
Troponin I: <0.03 ng/mL (ref <0.031)

## 2014-02-25 LAB — HEPARIN LEVEL (UNFRACTIONATED): Heparin Unfractionated: 0.36 IU/mL (ref 0.30–0.70)

## 2014-02-25 LAB — GLUCOSE, CAPILLARY
GLUCOSE-CAPILLARY: 90 mg/dL (ref 70–99)
GLUCOSE-CAPILLARY: 174 mg/dL — AB (ref 70–99)
Glucose-Capillary: 117 mg/dL — ABNORMAL HIGH (ref 70–99)

## 2014-02-25 LAB — PROTIME-INR
INR: 1.07 (ref 0.00–1.49)
PROTHROMBIN TIME: 14 s (ref 11.6–15.2)

## 2014-02-25 SURGERY — LEFT HEART CATHETERIZATION WITH CORONARY ANGIOGRAM
Anesthesia: LOCAL

## 2014-02-25 MED ORDER — ASPIRIN EC 81 MG PO TBEC
81.0000 mg | DELAYED_RELEASE_TABLET | Freq: Every day | ORAL | Status: DC
  Administered 2014-02-26: 81 mg via ORAL
  Filled 2014-02-25: qty 1

## 2014-02-25 MED ORDER — HEPARIN BOLUS VIA INFUSION
2000.0000 [IU] | Freq: Once | INTRAVENOUS | Status: AC
  Administered 2014-02-25: 2000 [IU] via INTRAVENOUS
  Filled 2014-02-25: qty 2000

## 2014-02-25 MED ORDER — MIDAZOLAM HCL 2 MG/2ML IJ SOLN
INTRAMUSCULAR | Status: AC
  Filled 2014-02-25: qty 2

## 2014-02-25 MED ORDER — INSULIN ASPART 100 UNIT/ML ~~LOC~~ SOLN
0.0000 [IU] | Freq: Three times a day (TID) | SUBCUTANEOUS | Status: DC
  Administered 2014-02-26: 2 [IU] via SUBCUTANEOUS

## 2014-02-25 MED ORDER — ROSUVASTATIN CALCIUM 20 MG PO TABS
20.0000 mg | ORAL_TABLET | Freq: Every day | ORAL | Status: DC
  Administered 2014-02-25 – 2014-02-26 (×2): 20 mg via ORAL
  Filled 2014-02-25 (×2): qty 1

## 2014-02-25 MED ORDER — HEPARIN (PORCINE) IN NACL 2-0.9 UNIT/ML-% IJ SOLN
INTRAMUSCULAR | Status: AC
  Filled 2014-02-25: qty 1000

## 2014-02-25 MED ORDER — SODIUM CHLORIDE 0.9 % IJ SOLN: 3.0000 mL | INTRAMUSCULAR | Status: DC | PRN

## 2014-02-25 MED ORDER — NITROGLYCERIN 0.4 MG SL SUBL: 0.4000 mg | SUBLINGUAL_TABLET | SUBLINGUAL | Status: DC | PRN

## 2014-02-25 MED ORDER — ACETAMINOPHEN 325 MG PO TABS: 650.0000 mg | ORAL_TABLET | ORAL | Status: DC | PRN

## 2014-02-25 MED ORDER — ACETAMINOPHEN 325 MG PO TABS
650.0000 mg | ORAL_TABLET | ORAL | Status: DC | PRN
  Administered 2014-02-26: 650 mg via ORAL

## 2014-02-25 MED ORDER — ASPIRIN 81 MG PO CHEW
81.0000 mg | CHEWABLE_TABLET | ORAL | Status: AC
  Administered 2014-02-25: 81 mg via ORAL
  Filled 2014-02-25: qty 1

## 2014-02-25 MED ORDER — TRAMADOL HCL 50 MG PO TABS
50.0000 mg | ORAL_TABLET | Freq: Four times a day (QID) | ORAL | Status: DC | PRN
  Administered 2014-02-25 – 2014-02-26 (×5): 50 mg via ORAL
  Filled 2014-02-25 (×5): qty 1

## 2014-02-25 MED ORDER — LIDOCAINE HCL (PF) 1 % IJ SOLN
INTRAMUSCULAR | Status: AC
  Filled 2014-02-25: qty 30

## 2014-02-25 MED ORDER — SODIUM CHLORIDE 0.9 % IV SOLN
1.0000 mL/kg/h | INTRAVENOUS | Status: DC
  Administered 2014-02-25: 1 mL/kg/h via INTRAVENOUS

## 2014-02-25 MED ORDER — LEVOTHYROXINE SODIUM 150 MCG PO TABS
150.0000 ug | ORAL_TABLET | Freq: Every day | ORAL | Status: DC
  Administered 2014-02-25 – 2014-02-26 (×2): 150 ug via ORAL
  Filled 2014-02-25 (×3): qty 1

## 2014-02-25 MED ORDER — ATENOLOL 25 MG PO TABS
25.0000 mg | ORAL_TABLET | Freq: Two times a day (BID) | ORAL | Status: DC
  Administered 2014-02-25 – 2014-02-26 (×3): 25 mg via ORAL
  Filled 2014-02-25 (×4): qty 1

## 2014-02-25 MED ORDER — NITROGLYCERIN 1 MG/10 ML FOR IR/CATH LAB
INTRA_ARTERIAL | Status: AC
  Filled 2014-02-25: qty 10

## 2014-02-25 MED ORDER — SODIUM CHLORIDE 0.9 % IV SOLN
INTRAVENOUS | Status: DC
  Administered 2014-02-25: 20:00:00 via INTRAVENOUS
  Administered 2014-02-25: 150 mL/h via INTRAVENOUS

## 2014-02-25 MED ORDER — FENTANYL CITRATE 0.05 MG/ML IJ SOLN
INTRAMUSCULAR | Status: AC
  Filled 2014-02-25: qty 2

## 2014-02-25 MED ORDER — SODIUM CHLORIDE 0.9 % IV SOLN: 250.0000 mL | INTRAVENOUS | Status: DC | PRN

## 2014-02-25 MED ORDER — ASPIRIN EC 81 MG PO TBEC
81.0000 mg | DELAYED_RELEASE_TABLET | Freq: Every day | ORAL | Status: DC
  Administered 2014-02-25: 81 mg via ORAL
  Filled 2014-02-25: qty 1

## 2014-02-25 MED ORDER — ONDANSETRON HCL 4 MG/2ML IJ SOLN
4.0000 mg | Freq: Four times a day (QID) | INTRAMUSCULAR | Status: DC | PRN
  Filled 2014-02-25: qty 2

## 2014-02-25 MED ORDER — SODIUM CHLORIDE 0.9 % IJ SOLN
3.0000 mL | Freq: Two times a day (BID) | INTRAMUSCULAR | Status: DC
  Administered 2014-02-25 (×2): 3 mL via INTRAVENOUS

## 2014-02-25 MED ORDER — ONDANSETRON HCL 4 MG/2ML IJ SOLN: 4.0000 mg | Freq: Four times a day (QID) | INTRAMUSCULAR | Status: DC | PRN

## 2014-02-25 NOTE — Progress Notes (Signed)
Site area: Right groin a 5 french sheath arterial was removed  Site Prior to Removal:  Level 0  Pressure Applied For 20 MINUTES    Minutes Beginning at 1850  Manual:   Yes.    Patient Status During Pull:  stable  Post Pull Groin Site:  Level 0  Post Pull Instructions Given:  Yes.    Post Pull Pulses Present:  Yes.    Dressing Applied:  Yes.    Comments:  VS remain stable during sheath pull.  Pt has some soreness at site due to manual pressure held.

## 2014-02-25 NOTE — CV Procedure (Addendum)
[name] is a 69 y.o. female    [id number]  [id number] LOCATION:  FACILITY: [facility]  PHYSICIAN: Troy Sine, MD, [facility] [birth year]   DATE OF PROCEDURE:  02/25/2014    CARDIAC CATHETERIZATION     HISTORY:    [name] is a 69 y.o. female who has a history of hypertension, hypothyroidism, type 2 diabetes mellitus, and established coronary artery disease as well as peripheral vascular disease.  She is status post stenting of her LAD and circumflex vessel with previous circumflex occlusion.  She has a documented abdominal aortic aneurysm.  She was admitted to the hospital with recurrent chest pain.  She has not been able to tolerate dual antiplatelet therapy.  She is referred for cardiac catheterization and possible intervention if necessary.   PROCEDURE:  Left heart catheterization: coronary angiography, left ventriculography; distal aortography.   The patient was brought to the [facility] cardiac catherization laboratory in the fasting state.  She was premedicated with Versed 2 mg and fentanyl 50 g.  Her right groin was prepped and shaved in usual sterile fashion. Xylocaine 1% was used for local anesthesia. A 5 French sheath was inserted into the R femoral artery. Diagnostic catheterizatiion was done with 5 [place] FL4, FR4, and pigtail catheters.  A versacore wire was necessary to navigate proximal to the patient's abdominal aortic aneurysm.  A long exchange wire was used for catheter exchanges.  Left ventriculography was done with 25 cc Omnipaque contrast.  Distal aortography was performed to further evaluate the abdominal aortic aneurysm. Hemostasis was obtained by direct manual compression. The patient tolerated the procedure well.   HEMODYNAMICS:   Central Aorta: 145/78   Left Ventricle: 145/14  ANGIOGRAPHY:  Left main: Angiographically normal and bifurcated into the LAD and left circumflex coronary artery.   LAD: Moderate size vessel that had a widely patent stent  proximally.  The vessel gave rise to 2 diagonal vessels and extended to and wrapped around the LV apex.  There is minimal luminal irregularity.  Left circumflex: Moderate size vessel that had a widely patent stent at the R June and proximal portion of the obtuse marginal 1 branch.  Remainder of the vessel was normal.  Right coronary artery: Small to moderate size vessel that had smooth 40% proximal diffuse narrowing.  Left ventriculography revealed normal LV contractility subtle minimal residual focal mid inferior hypocontractility..Ejection fraction is approximately 55-60%.  Distal Aortagraphy revealed moderate sized mid-distal infarenal abdominal aortic aneurysm.  There was no evidence for renal artery stenosis.  The common iliacs appeared free of significant disease.  IMPRESSION:  Normal LV function with an ejection fraction of 55-60% and minimal residual focal mid inferior hypocontractility.  Mild not significantly obstructive CAD with widely patent stents in the LAD, circumflex marginal vessel, and 40% diffuse narrowing in the proximal RCA.  RECOMMENDATION:  Medical therapy.    Troy Sine, MD, [facility] 02/25/2014 7:59 PM

## 2014-02-25 NOTE — Progress Notes (Signed)
UR completed 

## 2014-02-25 NOTE — Progress Notes (Signed)
[place] for heparin Indication: chest pain/ACS  Allergies  Allergen Reactions  . Doxycycline     REACTION: gi upset  . Miconazole Nitrate     REACTION: hives  . Prilosec [Omeprazole] Other (See Comments)    Chest pain  . Augmentin [Amoxicillin-Pot Clavulanate] Hives, Itching and Rash    Patient Measurements: Height: 5\' 6"  (167.6 cm) Weight: 224 lb 13.9 oz (102 kg) IBW/kg (Calculated) : 59.3 Heparin Dosing Weight: 79 kg  Vital Signs: Temp: 97.9 F (36.6 C) (02/08 2012) Temp Source: Oral (02/08 2012) BP: 102/64 mmHg (02/08 2012) Pulse Rate: 80 (02/08 2012)  Labs:  Recent Labs  02/24/14 1151 02/25/14 0037  HGB 14.8 13.3  HCT 44.9 41.5  PLT 274 255  HEPARINUNFRC  --  <0.10*  CREATININE 0.84 1.08  TROPONINI  --  <0.03    Estimated Creatinine Clearance: 60.1 mL/min (by C-G formula based on Cr of 1.08).  Assessment: 69 y.o. female with chest pain for heparin   Goal of Therapy:   Heparin level 0.3-0.7 units/ml Monitor platelets by anticoagulation protocol: Yes   Plan:  Heparin 2000 units IV bolus, then increase heparin 1250 units/hr Check heparin level in 6 hours.   [name], [place] Curb 02/25/2014,2:13 AM

## 2014-02-25 NOTE — Progress Notes (Signed)
TELEMETRY: Reviewed telemetry pt in NSR, 6 beat run NSVT: Filed Vitals:   02/24/14 1930 02/24/14 2012 02/25/14 0018 02/25/14 0641  BP: 111/68 102/64  105/61  Pulse: 88 80  74  Temp:  97.9 F (36.6 C)  97.8 F (36.6 C)  TempSrc:  Oral  Oral  Resp: 16 18  16   Height:      Weight:  224 lb 13.9 oz (102 kg) 224 lb 13.9 oz (102 kg)   SpO2: 96% 98%  99%    Intake/Output Summary (Last 24 hours) at 02/25/14 0802 Last data filed at 02/25/14 0035  Gross per 24 hour  Intake      3 ml  Output      0 ml  Net      3 ml   Filed Weights   02/24/14 1148 02/24/14 2012 02/25/14 0018  Weight: 226 lb (102.513 kg) 224 lb 13.9 oz (102 kg) 224 lb 13.9 oz (102 kg)    Subjective Had chest and arm pain for which she received pain medication last night. Now pain free.  [name] aspirin EC  81 mg Oral Daily  . atenolol  25 mg Oral BID  . insulin aspart  0-15 Units Subcutaneous TID WC  . levothyroxine  150 mcg Oral QAC breakfast  . rosuvastatin  20 mg Oral Daily  . sodium chloride  3 mL Intravenous Q12H   . sodium chloride 1 mL/kg/hr (02/25/14 0356)  . heparin 1,250 Units/hr (02/25/14 0307)    LABS: Basic Metabolic Panel:  Recent Labs  02/24/14 1151 02/25/14 0037  NA 142 139  K 4.3 3.7  CL 109 105  CO2 23 21  GLUCOSE 114* 163*  BUN 8 14  CREATININE 0.84 1.08  CALCIUM 8.9 8.4   Liver Function Tests: No results for input(s): AST, ALT, ALKPHOS, BILITOT, PROT, ALBUMIN in the last 72 hours. No results for input(s): LIPASE, AMYLASE in the last 72 hours. CBC:  Recent Labs  02/24/14 1151 02/25/14 0037  WBC 8.0 8.0  HGB 14.8 13.3  HCT 44.9 41.5  MCV 86.0 88.9  PLT 274 255   Cardiac Enzymes:  Recent Labs  02/25/14 0037 02/25/14 0628  TROPONINI <0.03 0.05*   BNP: No results for input(s): PROBNP in the last 72 hours. D-Dimer: No results for input(s): DDIMER in the last 72 hours. Hemoglobin A1C: No results for input(s): HGBA1C in the last 72 hours. Fasting Lipid Panel: No  results for input(s): CHOL, HDL, LDLCALC, TRIG, CHOLHDL, LDLDIRECT in the last 72 hours. Thyroid Function Tests: No results for input(s): TSH, T4TOTAL, T3FREE, THYROIDAB in the last 72 hours.  Invalid input(s): FREET3   Radiology/Studies:  Dg Chest 2 View  02/24/2014   CLINICAL DATA:  Left chest and left arm pain for 2 weeks.  EXAM: CHEST  2 VIEW  COMPARISON:  PA and lateral chest 06/06/2013.  FINDINGS: The lungs are clear. Heart size is normal. No pneumothorax or pleural effusion. The aorta is mildly tortuous. Degenerative change is seen about the shoulders and thoracic spine.  IMPRESSION: No acute disease.   Electronically Signed   By: Inge Rise M.D.   On: 02/24/2014 12:58   Ecg: today NSR, LAD, no acute ST changes.  PHYSICAL EXAM General: Well developed, obese, in no acute distress. Head: Normocephalic, atraumatic, sclera non-icteric, oropharynx is clear Neck: Negative for carotid bruits. JVD not elevated. No adenopathy Lungs: Clear bilaterally to auscultation without wheezes, rales, or rhonchi. Breathing is unlabored. Heart: RRR S1 S2 without murmurs,  rubs, or gallops.  Abdomen: Soft, non-tender, non-distended with normoactive bowel sounds. No hepatomegaly. No rebound/guarding. No obvious abdominal masses. Msk:  Strength and tone appears normal for age. Extremities: No clubbing, cyanosis or edema.  Distal pedal pulses are 2+ and equal bilaterally. Neuro: Alert and oriented X 3. Moves all extremities spontaneously. Psych:  Responds to questions appropriately with a normal affect.  ASSESSMENT AND PLAN: 1. Unstable angina. 1/4 troponins slightly elevated at 0.05. Plan LHC today with possible PCI. Continue ASA, atenolol, and IV heparin. 2. CAD s/p multiple stents as noted in HPI 3. Noncompliance with DAPT. Patient complained that Plavix caused numbness in her hands and swelling in her feet. Was switched to Brilinta which she states didn't agree with her. Stressed importance of DAPT  to reduce risk of stent thrombosis. Needs to take indefinitely. I would favor resuming Brilinta. If she needs a PCI today would reload her. Will hold for now in case CABG needed. 4. HTN controlled. 5. Hyperlipidemia. On Crestor 20 mg daily.  Present on Admission:  . Essential hypertension . Hyperlipidemia . Obesity . Coronary atherosclerosis . Chest pain  Signed, Isay Perleberg [place], [place] 02/25/2014 8:02 AM

## 2014-02-25 NOTE — Progress Notes (Signed)
Pt back from cath lab, alert and oriented, denied pain at this time. Right femoral site no signs of bleeding, dressing clean and intact. Will monitor.   02/25/14 1933  Vitals  Temp 97.5 F (36.4 C)  Temp Source Oral  BP 112/65 mmHg  MAP (mmHg) 77  BP Location Left Arm  BP Method Automatic  Patient Position (if appropriate) Lying  Pulse Rate 70  Pulse Rate Source Dinamap  Resp 16  Oxygen Therapy  SpO2 98 %  O2 Device Room Air

## 2014-02-26 ENCOUNTER — Encounter (HOSPITAL_COMMUNITY): Payer: Self-pay | Admitting: Cardiovascular Disease

## 2014-02-26 DIAGNOSIS — Z91148 Patient's other noncompliance with medication regimen for other reason: Secondary | ICD-10-CM

## 2014-02-26 DIAGNOSIS — Z9114 Patient's other noncompliance with medication regimen: Secondary | ICD-10-CM

## 2014-02-26 HISTORY — DX: Patient's other noncompliance with medication regimen: Z91.14

## 2014-02-26 HISTORY — DX: Patient's other noncompliance with medication regimen for other reason: Z91.148

## 2014-02-26 LAB — CBC
HEMATOCRIT: 39.8 % (ref 36.0–46.0)
Hemoglobin: 13.2 g/dL (ref 12.0–15.0)
MCH: 28.7 pg (ref 26.0–34.0)
MCHC: 33.2 g/dL (ref 30.0–36.0)
MCV: 86.5 fL (ref 78.0–100.0)
Platelets: 221 10*3/uL (ref 150–400)
RBC: 4.6 MIL/uL (ref 3.87–5.11)
RDW: 14.7 % (ref 11.5–15.5)
WBC: 7.2 10*3/uL (ref 4.0–10.5)

## 2014-02-26 LAB — GLUCOSE, CAPILLARY
GLUCOSE-CAPILLARY: 137 mg/dL — AB (ref 70–99)
Glucose-Capillary: 152 mg/dL — ABNORMAL HIGH (ref 70–99)
Glucose-Capillary: 148 mg/dL — ABNORMAL HIGH (ref 70–99)

## 2014-02-26 LAB — POCT ACTIVATED CLOTTING TIME: ACTIVATED CLOTTING TIME: 97 s

## 2014-02-26 MED ORDER — METFORMIN HCL ER 500 MG PO TB24: 2000.0000 mg | ORAL_TABLET | Freq: Every day | ORAL | Status: DC

## 2014-02-26 MED ORDER — ROSUVASTATIN CALCIUM 20 MG PO TABS: 20.0000 mg | ORAL_TABLET | Freq: Every day | ORAL | Status: DC

## 2014-02-26 MED ORDER — TICAGRELOR 90 MG PO TABS
90.0000 mg | ORAL_TABLET | Freq: Two times a day (BID) | ORAL | Status: DC
  Filled 2014-02-26 (×2): qty 1

## 2014-02-26 MED ORDER — ACETAMINOPHEN 325 MG PO TABS: 650.0000 mg | ORAL_TABLET | ORAL | Status: DC | PRN

## 2014-02-26 MED ORDER — TICAGRELOR 90 MG PO TABS: 90.0000 mg | ORAL_TABLET | Freq: Two times a day (BID) | ORAL | Status: DC

## 2014-02-26 MED ORDER — ACETAMINOPHEN 325 MG PO TABS
ORAL_TABLET | ORAL | Status: AC
  Filled 2014-02-26: qty 2

## 2014-02-26 NOTE — Discharge Summary (Signed)
Physician Discharge Summary       Patient ID: [name] MRN: [id number] DOB/AGE: 69-Aug-1947 69 y.o.  Admit date: 02/24/2014 Discharge date: 02/26/2014 Primary Cardiologist:Dr. Burt Knack   Discharge Diagnoses:  Principal Problem:   Chest pain, stable coronary arteries at cath, non cardiac pain, resolved Active Problems:   Noncompliance with medications   Diabetes   Hyperlipidemia   Obesity   Essential hypertension   Coronary atherosclerosis   AAA (abdominal aortic aneurysm)   Discharged Condition: good  Procedures: 02/25/14 cardiac cath with Dr. Clearnce Hasten Course: 69 year old obese female, who was last seen by Dr. Burt Knack in March 2015, with a history of coronary artery disease with previous stenting of the LAD and left circumflex. History also includes hypertension, hypothyroidism, diabetes mellitus type 2, anxiety. Her heart catheterization in 2012 demonstrated total occlusion of the left circumflex distal to the previously stented segment. She was treated with a bare-metal stent. She otherwise was found to have patency of the LAD and moderate stenosis in the right coronary artery with medical therapy recommended. Her previous cath before this admit was May 2015 and revealed severe stenosis in the proximal LAD. This was stented with a resolute DES. Normal LVF. Also with AAA on most recent evaluation it was 4.2 cm in maximal dimension. Patient presented with chest pain 02/24/14. The patient reported left shoulder pain which would come and go for the last two weeks. She described it as throbbing. She also reports off and on chest pain which is a "hard, sharp" pain. 12/10 with SOB, diaphoresis. One week ago she took a SL NTG which took the pain away. In ER pain free. She sleeps on three pillows because that's what she has always done. The patient denied nausea, vomiting, fever, orthopnea, dizziness, PND, cough, congestion, abdominal pain, hematochezia, melena, lower  extremity edema.  She reportd taking the plavix for about three months and then brilinta for about a week and then stopping it because of the way it made her feel. Dr. Angelena Form recommended life-long DAPT. Troponin was negative 2. But one of 0.05 most likely spurious.   No acute EKG changes. She reports the last time she had a Lexiscan she was hollering and thought she was going to die. She was admitted and underwent cardiac cath which revealed mild, not significantly obstructive CAD with widely patent stents in the LAD, circumflex marginal vessel, and 40% diffuse narrowing in the proximal RCA.  EF 55-60% and minimal residual focal mid inferior hypercontractility.  She was seen post cath by Dr. Martinique and found stable for discharge with no further chest pain.  She complained of pain in rt groin cath site but only mild ecchymosis and no bruit and no hematoma.  She was reassured. We resumed her brilinta and explained if she did not take she could have a heart attack.   Consults: cardiology  Significant Diagnostic Studies:  BMET    Component Value Date/Time   NA 139 02/25/2014 0037   K 3.7 02/25/2014 0037   CL 105 02/25/2014 0037   CO2 21 02/25/2014 0037   GLUCOSE 163* 02/25/2014 0037   GLUCOSE 166* 01/02/2006 0825   BUN 14 02/25/2014 0037   CREATININE 1.08 02/25/2014 0037   CALCIUM 8.4 02/25/2014 0037   GFRNONAA 52* 02/25/2014 0037   GFRAA 60* 02/25/2014 0037    CBC    Component Value Date/Time   WBC 7.2 02/26/2014 0441   RBC 4.60 02/26/2014 0441   HGB 13.2 02/26/2014 0441   HCT  39.8 02/26/2014 0441   PLT 221 02/26/2014 0441   MCV 86.5 02/26/2014 0441   MCH 28.7 02/26/2014 0441   MCHC 33.2 02/26/2014 0441   RDW 14.7 02/26/2014 0441   LYMPHSABS 2.7 01/08/2013 1047   MONOABS 0.6 01/08/2013 1047   EOSABS 0.2 01/08/2013 1047   BASOSABS 0.1 01/08/2013 1047   Troponin <0.03 X 3 with one troponin of 0.05  This was without pain   CARDIAC  CATH: HEMODYNAMICS:  Central Aorta: 145/78  Left Ventricle: 145/14  ANGIOGRAPHY:  Left main: Angiographically normal and bifurcated into the LAD and left circumflex coronary artery.   LAD: Moderate size vessel that had a widely patent stent proximally. The vessel gave rise to 2 diagonal vessels and extended to and wrapped around the LV apex. There is minimal luminal irregularity.  Left circumflex: Moderate size vessel that had a widely patent stent at the R June and proximal portion of the obtuse marginal 1 branch. Remainder of the vessel was normal.  Right coronary artery: Small to moderate size vessel that had smooth 40% proximal diffuse narrowing.  Left ventriculography revealed normal LV contractility subtle minimal residual focal mid inferior hypocontractility..Ejection fraction is approximately 55-60%.  Distal Aortagraphy revealed moderate sized mid-distal infarenal abdominal aortic aneurysm. There was no evidence for renal artery stenosis. The common iliacs appeared free of significant disease.  CHEST 2 VIEWCOMPARISON: PA and lateral chest 06/06/2013. FINDINGS: The lungs are clear. Heart size is normal. No pneumothorax or pleural effusion. The aorta is mildly tortuous. Degenerative change is seen about the shoulders and thoracic spine. IMPRESSION: No acute disease.   Discharge Exam: Blood pressure 125/70, pulse 69, temperature 98.6 F (37 C), temperature source Oral, resp. rate 18, height 5\' 6"  (1.676 m), weight 224 lb 13.9 oz (102 kg), SpO2 100 %.   Disposition: 01-Home or Self Care     Medication List    STOP taking these medications        predniSONE 10 MG tablet  Commonly known as:  DELTASONE      TAKE these medications        acetaminophen 325 MG tablet  Commonly known as:  TYLENOL  Take 2 tablets (650 mg total) by mouth every 4 (four) hours as needed for headache or mild pain.     allopurinol 300 MG tablet  Commonly known as:  ZYLOPRIM   Take 1 tablet (300 mg total) by mouth daily.     aspirin EC 81 MG tablet  Take 1 tablet (81 mg total) by mouth daily. For 1 month.  Then resume taking 4 times a week (tue thur sat sun)     atenolol 25 MG tablet  Commonly known as:  TENORMIN  Take 1 tablet (25 mg total) by mouth 2 (two) times daily.     cetirizine 10 MG chewable tablet  Commonly known as:  ZYRTEC  Chew 10 mg by mouth daily as needed for allergies.     fluconazole 150 MG tablet  Commonly known as:  DIFLUCAN  1 tab by mouth every 3 days as needed     fluticasone 50 MCG/ACT nasal spray  Commonly known as:  FLONASE  Place 2 sprays into both nostrils daily.     gabapentin 300 MG capsule  Commonly known as:  NEURONTIN  Take 1 capsule (300 mg total) by mouth 3 (three) times daily.     HYDROcodone-homatropine 5-1.5 MG/5ML syrup  Commonly known as:  HYCODAN  Take 5 mLs by mouth every 6 (six) hours  as needed for cough.     hydrOXYzine 10 MG tablet  Commonly known as:  ATARAX/VISTARIL  Take 1 tablet (10 mg total) by mouth 3 (three) times daily as needed for itching.     meloxicam 7.5 MG tablet  Commonly known as:  MOBIC  Take 1 tablet (7.5 mg total) by mouth daily.     metFORMIN 500 MG 24 hr tablet  Commonly known as:  GLUCOPHAGE-XR  Take 4 tablets (2,000 mg total) by mouth daily with breakfast.  Start taking on:  02/28/2014     nitroGLYCERIN 0.4 MG SL tablet  Commonly known as:  NITROSTAT  Place 1 tablet (0.4 mg total) under the tongue every 5 (five) minutes as needed.     oxyCODONE 5 MG immediate release tablet  Commonly known as:  Oxy IR/ROXICODONE  1-2 tabs every 6 hours as needed for pain, with limit 4 tabs per day -     rosuvastatin 20 MG tablet  Commonly known as:  CRESTOR  Take 1 tablet (20 mg total) by mouth daily.     SYNTHROID 150 MCG tablet  Generic drug:  levothyroxine  TAKE 1 TABLET BY MOUTH EVERY DAY     ticagrelor 90 MG Tabs tablet  Commonly known as:  BRILINTA  Take 1 tablet (90 mg  total) by mouth 2 (two) times daily.     tiZANidine 4 MG tablet  Commonly known as:  ZANAFLEX  Take 1 tablet (4 mg total) by mouth every 6 (six) hours as needed for muscle spasms.     traMADol 50 MG tablet  Commonly known as:  ULTRAM  Take 1 tablet (50 mg total) by mouth every 6 (six) hours as needed for pain.       Follow-up Information    Follow up with Sherren Mocha, MD On 03/04/2014.   Specialty:  Cardiology   Why:  at 11:15 am   Contact information:   [address] [place] [address] [phone]        Discharge [place] at [phone] if any bleeding, swelling or drainage at cath site.  May shower, no tub baths for 48 hours for groin sticks. No lifting over 5 pounds for 3 days.  No Driving for 3 days  Heart Healthy Diabetic  Diet  Take Brilinta and asprin daily to prevent heart attacks.  Begin walking for exercise.  Bring all medications with you to office.  Signed: Isaiah Serge Nurse Practitioner-Certified [facility]: [facility] 02/26/2014, 12:06 PM  Time spent on discharge :> 30 minutes.

## 2014-02-26 NOTE — Progress Notes (Signed)
Subjective: No chest pain, she does complain of rt groin pain at cath site.  Objective: Vital signs in last 24 hours: Temp:  [97.5 F (36.4 C)-98.6 F (37 C)] 98.6 F (37 C) (02/10 0416) Pulse Rate:  [60-78] 69 (02/10 0416) Resp:  [13-28] 18 (02/10 0416) BP: (103-132)/(65-82) 125/70 mmHg (02/10 0416) SpO2:  [91 %-100 %] 100 % (02/10 0416) Weight change:  Last BM Date: 02/24/14 Intake/Output from previous day: + 460 02/09 0701 - 02/10 0700 In: 2385 [P.O.:840; I.V.:1545] Out: 1350 [Urine:1350] Intake/Output this shift:    PE: General:Pleasant affect, NAD Skin:Warm and dry, brisk capillary refill HEENT:normocephalic, sclera clear, mucus membranes moist Heart:S1S2 RRR without murmur, gallup, rub or click Lungs:clear without rales, rhonchi, or wheezes LNL:GXQJJ, soft, non tender, + BS, do not palpate liver spleen or masses Ext:no lower ext edema, 2+ pedal pulses, 2+ radial pulses, rt groin with some ecchymosis, I do not hear bruit no hematoma  Neuro:alert and oriented, MAE, follows commands, + facial symmetry  tele:  SR  Lab Results:  Recent Labs  02/25/14 0037 02/26/14 0441  WBC 8.0 7.2  HGB 13.3 13.2  HCT 41.5 39.8  PLT 255 221   BMET  Recent Labs  02/24/14 1151 02/25/14 0037  NA 142 139  K 4.3 3.7  CL 109 105  CO2 23 21  GLUCOSE 114* 163*  BUN 8 14  CREATININE 0.84 1.08  CALCIUM 8.9 8.4    Recent Labs  02/25/14 0628 02/25/14 1130  TROPONINI 0.05* <0.03    Lab Results  Component Value Date   CHOL 184 08/22/2013   HDL 32.00* 08/22/2013   LDLCALC 118* 08/22/2013   LDLDIRECT 73.1 02/27/2012   TRIG 170.0* 08/22/2013   CHOLHDL 6 08/22/2013   Lab Results  Component Value Date   HGBA1C 7.0* 08/22/2013     Lab Results  Component Value Date   TSH 1.920 06/06/2013     Studies/Results: Dg Chest 2 View  02/24/2014   CLINICAL DATA:  Left chest and left arm pain for 2 weeks.  EXAM: CHEST  2 VIEW  COMPARISON:  PA and lateral chest  06/06/2013.  FINDINGS: The lungs are clear. Heart size is normal. No pneumothorax or pleural effusion. The aorta is mildly tortuous. Degenerative change is seen about the shoulders and thoracic spine.  IMPRESSION: No acute disease.   Electronically Signed   By: Inge Rise M.D.   On: 02/24/2014 12:58   Cardiac cath: Normal LV function with an ejection fraction of 55-60% and minimal residual focal mid inferior hypocontractility.  Mild not significantly obstructive CAD with widely patent stents in the LAD, circumflex marginal vessel, and 40% diffuse narrowing in the proximal RCA.  Medications: I have reviewed the patient's current medications. Scheduled Meds: . acetaminophen      . aspirin EC  81 mg Oral Daily  . atenolol  25 mg Oral BID  . insulin aspart  0-15 Units Subcutaneous TID WC  . levothyroxine  150 mcg Oral QAC breakfast  . rosuvastatin  20 mg Oral Daily   Continuous Infusions: . sodium chloride 100 mL/hr at 02/26/14 0115   PRN Meds:.acetaminophen, nitroGLYCERIN, ondansetron (ZOFRAN) IV, traMADol  Assessment/Plan:  history of coronary artery disease with previous stenting of the LAD and left circumflex. History also includes hypertension, hypothyroidism, diabetes mellitus type 2, anxiety. Her heart catheterization in 2012 demonstrated total occlusion of the left circumflex distal to the previously stented segment. She was treated with a  bare-metal stent. She otherwise was found to have patency of the LAD and moderate stenosis in the right coronary artery with medical therapy recommended. Her last cath was May 2015 and revealed severe stenosis in the proximal LAD. This was stented with a resolute DES. Normal LVF. She was Last lipid panel below. The patient also has a small moderate sized abdominal aortic aneurysm followed by Dr. early. On most recent evaluation it was 4.2 cm in maximal dimension  1. Unstable angina. 1/4 troponins slightly elevated at 0.05. LHC with non  obstructive disease- to treat medically.   Continue ASA, atenolol, stopped IV heparin.  2. CAD s/p multiple stents as noted in HPI- stable- ambulate and d/c home if no problems  3. Noncompliance with DAPT. Patient complained that Plavix caused numbness in her hands and swelling in her feet. Was switched to Brilinta which she states didn't agree with her. Stressed importance of DAPT to reduce risk of stent thrombosis. Needs to take indefinitely. I would favor resuming Brilinta.  Held for cath results, will resume today.  I discussed with Pt.   4. HTN controlled.  5. Hyperlipidemia. On Crestor 20 mg daily.  In August LDL 118   6. AAA 4.0 X 4.1 in Dec.     LOS: 1 day   Time spent with pt. :15 minutes. [facility]  Nurse Practitioner Certified Pager [phone] or after 5pm and on weekends call [phone] 02/26/2014, 9:43 AM Patient seen and examined and history reviewed. Agree with above findings and plan. Complains of groin pain. Cath site shows mild ecchymosis. No hematoma. No bruit. Pulses are good. She is stable for DC today. Will need to resume DAPT. Cardiac cath results favorable. Will arrange follow up in the office.  Peter Martinique, [place] 02/26/2014 11:29 AM

## 2014-02-26 NOTE — Discharge Instructions (Signed)
Call [facility] at [phone] if any bleeding, swelling or drainage at cath site.  May shower, no tub baths for 48 hours for groin sticks. No lifting over 5 pounds for 3 days.  No Driving for 3 days  Heart Healthy Diabetic Diet  Take Brilinta and asprin daily to prevent heart attacks.  Call if any problems prior to visit with Dr. Burt Knack   Begin walking for exercise  Bring all medications with you to office.

## 2014-02-26 NOTE — Progress Notes (Signed)
Discharge instructions given. Pt verbalized understanding and all questions were answered.  

## 2014-02-27 ENCOUNTER — Telehealth: Payer: Self-pay | Admitting: *Deleted

## 2014-02-27 NOTE — Telephone Encounter (Signed)
Pt was d/C 02/26/14 had a cardiac catherization done on 02/25/14 pt will follow-up with Dr. Burt Knack...[name]

## 2014-03-11 ENCOUNTER — Telehealth: Payer: Self-pay | Admitting: *Deleted

## 2014-03-11 MED ORDER — OXYCODONE HCL 5 MG PO TABS: ORAL_TABLET | ORAL | Status: DC

## 2014-03-11 NOTE — Telephone Encounter (Signed)
Left msg on triage requesting refill on her oxycodone...[name]

## 2014-03-11 NOTE — Telephone Encounter (Signed)
Done hardcopy to Cherina  

## 2014-03-11 NOTE — Telephone Encounter (Signed)
Called patient and left detailed message that her RX is ready for pickup.

## 2014-03-26 ENCOUNTER — Ambulatory Visit (INDEPENDENT_AMBULATORY_CARE_PROVIDER_SITE_OTHER): Payer: Medicare Other | Admitting: Internal Medicine

## 2014-03-26 ENCOUNTER — Encounter: Payer: Self-pay | Admitting: Internal Medicine

## 2014-03-26 VITALS — BP 112/72 | HR 81 | Temp 98.7°F | Resp 20 | Ht 66.0 in | Wt 226.0 lb

## 2014-03-26 DIAGNOSIS — I10 Essential (primary) hypertension: Secondary | ICD-10-CM | POA: Diagnosis not present

## 2014-03-26 DIAGNOSIS — I2510 Atherosclerotic heart disease of native coronary artery without angina pectoris: Secondary | ICD-10-CM

## 2014-03-26 DIAGNOSIS — R10819 Abdominal tenderness, unspecified site: Secondary | ICD-10-CM | POA: Diagnosis not present

## 2014-03-26 DIAGNOSIS — M1A4720 Other secondary chronic gout, left ankle and foot, without tophus (tophi): Secondary | ICD-10-CM | POA: Diagnosis not present

## 2014-03-26 DIAGNOSIS — E11311 Type 2 diabetes mellitus with unspecified diabetic retinopathy with macular edema: Secondary | ICD-10-CM

## 2014-03-26 MED ORDER — OXYCODONE HCL 5 MG PO TABS: ORAL_TABLET | ORAL | Status: DC

## 2014-03-26 MED ORDER — PREDNISONE 10 MG PO TABS: ORAL_TABLET | ORAL | Status: DC

## 2014-03-26 MED ORDER — KETOROLAC TROMETHAMINE 30 MG/ML IJ SOLN
30.0000 mg | Freq: Once | INTRAMUSCULAR | Status: AC
  Administered 2014-03-26: 30 mg via INTRAMUSCULAR

## 2014-03-26 MED ORDER — METHYLPREDNISOLONE ACETATE 80 MG/ML IJ SUSP
80.0000 mg | Freq: Once | INTRAMUSCULAR | Status: AC
  Administered 2014-03-26: 80 mg via INTRAMUSCULAR

## 2014-03-26 NOTE — Assessment & Plan Note (Signed)
stable overall by history and exam, recent data reviewed with pt, and pt to continue medical treatment as before,  to f/u any worsening symptoms or concerns Lab Results  Component Value Date   HGBA1C 7.0* 08/22/2013   To call for worsening cbg's or polys on steroid tx

## 2014-03-26 NOTE — Patient Instructions (Signed)
You had the steroid shot today, and pain shot  Please take all new medication as prescribed - the prednisone, and oxycodone  Please continue all other medications as before, and refills have been done if requested.  Please have the pharmacy call with any other refills you may need.  Please keep your appointments with your specialists as you may have planned

## 2014-03-26 NOTE — Assessment & Plan Note (Signed)
severe, for depomedorl IM, predpac asd,  to f/u any worsening symptoms or concerns

## 2014-03-26 NOTE — Progress Notes (Signed)
Subjective:    Patient ID: [name], female    DOB: 04-Jul-1945, 69 y.o.   MRN: [id number]  HPI   Here with acute onset left first MTP pain, red , swelling without trauma, mistep or fever.  Has hx of gout with prior flares.  Does not drink ETOH.  No recent nsaid use.  Overall good compliance with treatment, and good medicine tolerability.  Pt denies chest pain, increased sob or doe, wheezing, orthopnea, PND, increased LE swelling, palpitations, dizziness or syncope. Denies worsening reflux, abd pain, dysphagia, n/v, bowel change or blood, except does incidentally have marked bilat diffuse abd soreness worse to extend the spine when sitting, after a simple stumble backwards over a bag 2 days ago as well, no overt injury or bruising, but has signficant abd tender as above  Denies urinary symptoms such as dysuria, frequency, urgency, flank pain, hematuria or n/v, fever, chills. Past Medical History  Diagnosis Date  . Coronary artery disease     a. s/p inferior MI 1998->PCI of RCA. b. S/p prior PCI of the OM1 and LAD;  c. 05/2013 Cath/PCI: LM nl, LAD 90p (3.0x22 Resolute DES), 44m w/ patent stent, Diag 40p, 39m, LCX patent OM1 stent, RCA 50p, 102m, EF 65%.  . Hyperlipidemia   . Hypertension   . Obesity, unspecified   . Hypothyroidism   . Diabetes mellitus     TYPE II  . Anxiety state, unspecified   . Depressive disorder, not elsewhere classified   . Degeneration of lumbar or lumbosacral intervertebral disc   . Allergic rhinitis, cause unspecified   . Lower GI bleed 06/2010    Diverticular bleed  . Chronic LBP   . AAA (abdominal aortic aneurysm)     a. 4.7cm in 08/2012. Followed by [facility].  . Gout 08/22/2013  . Noncompliance with medications 02/26/2014   Past Surgical History  Procedure Laterality Date  . Total abdominal hysterectomy    . Lumbar fusion  01/2007    DR. Patrice Paradise...3-LEVEL WITH FIXATION  . Parathyroidectomy    . Thyroidectomy    . Cardiac catheterization      PCI OF BOTH THE  CIRCUMFLEX AND LEFT ANTERIOR DESCENDING ARTERY  . Cesarean section    . Heart stent  04-2010  and  Jun 07, 2013    X 3  . Spine surgery    . Left heart catheterization with coronary angiogram N/A 06/07/2013    Procedure: LEFT HEART CATHETERIZATION WITH CORONARY ANGIOGRAM;  Surgeon: [name], MD;  Location: [facility] CATH LAB;  Service: Cardiovascular;  Laterality: N/A;  . Left heart catheterization with coronary angiogram N/A 02/25/2014    Procedure: LEFT HEART CATHETERIZATION WITH CORONARY ANGIOGRAM;  Surgeon: Troy Sine, MD;  Location: [facility] CATH LAB;  Service: Cardiovascular;  Laterality: N/A;    reports that she quit smoking about 27 years ago. Her smoking use included Cigarettes. She quit after 30 years of use. She has never used smokeless tobacco. She reports that she does not drink alcohol or use illicit drugs. family history includes Arthritis in her maternal aunt; Cancer in her maternal aunt; Diabetes in her brother and maternal aunt; Heart attack (age of onset: 47) in her mother; Heart attack (age of onset: 46) in her father; Heart disease in her father and mother. There is no history of Colon cancer. Allergies  Allergen Reactions  . Doxycycline     REACTION: gi upset  . Miconazole Nitrate     REACTION: hives  . Prilosec [Omeprazole]  Other (See Comments)    Chest pain  . Augmentin [Amoxicillin-Pot Clavulanate] Hives, Itching and Rash   Current Outpatient Prescriptions on File Prior to Visit  Medication Sig Dispense Refill  . acetaminophen (TYLENOL) 325 MG tablet Take 2 tablets (650 mg total) by mouth every 4 (four) hours as needed for headache or mild pain.    [name] allopurinol (ZYLOPRIM) 300 MG tablet Take 1 tablet (300 mg total) by mouth daily. 90 tablet 3  . aspirin EC 81 MG tablet Take 1 tablet (81 mg total) by mouth daily. For 1 month.  Then resume taking 4 times a week (tue thur sat sun)    . atenolol (TENORMIN) 25 MG tablet Take 1 tablet (25 mg total) by mouth 2 (two)  times daily. 180 tablet 3  . cetirizine (ZYRTEC) 10 MG chewable tablet Chew 10 mg by mouth daily as needed for allergies.    . fluconazole (DIFLUCAN) 150 MG tablet 1 tab by mouth every 3 days as needed (Patient taking differently: Take 150 mg by mouth daily as needed. For fungus) 2 tablet 1  . fluticasone (FLONASE) 50 MCG/ACT nasal spray Place 2 sprays into both nostrils daily. (Patient taking differently: Place 2 sprays into both nostrils daily as needed for allergies. ) 16 g 6  . gabapentin (NEURONTIN) 300 MG capsule Take 1 capsule (300 mg total) by mouth 3 (three) times daily. (Patient taking differently: Take 300 mg by mouth daily as needed. ) 90 capsule 5  . HYDROcodone-homatropine (HYCODAN) 5-1.5 MG/5ML syrup Take 5 mLs by mouth every 6 (six) hours as needed for cough. 180 mL 0  . hydrOXYzine (ATARAX/VISTARIL) 10 MG tablet Take 1 tablet (10 mg total) by mouth 3 (three) times daily as needed for itching. 90 tablet 1  . meloxicam (MOBIC) 7.5 MG tablet Take 1 tablet (7.5 mg total) by mouth daily. 7 tablet 0  . metFORMIN (GLUCOPHAGE-XR) 500 MG 24 hr tablet Take 4 tablets (2,000 mg total) by mouth daily with breakfast.    . nitroGLYCERIN (NITROSTAT) 0.4 MG SL tablet Place 1 tablet (0.4 mg total) under the tongue every 5 (five) minutes as needed. 25 tablet 1  . oxyCODONE (OXY IR/ROXICODONE) 5 MG immediate release tablet 1-2 tabs every 6 hours as needed for pain, with limit 4 tabs per day - 120 tablet 0  . rosuvastatin (CRESTOR) 20 MG tablet Take 1 tablet (20 mg total) by mouth daily. 30 tablet 6  . SYNTHROID 150 MCG tablet TAKE 1 TABLET BY MOUTH EVERY DAY 90 tablet 3  . ticagrelor (BRILINTA) 90 MG TABS tablet Take 1 tablet (90 mg total) by mouth 2 (two) times daily. 60 tablet 6  . tiZANidine (ZANAFLEX) 4 MG tablet Take 1 tablet (4 mg total) by mouth every 6 (six) hours as needed for muscle spasms. 40 tablet 1  . traMADol (ULTRAM) 50 MG tablet Take 1 tablet (50 mg total) by mouth every 6 (six) hours as  needed for pain. 60 tablet 1   No current facility-administered medications on file prior to visit.   Review of Systems  Constitutional: Negative for unusual diaphoresis or night sweats HENT: Negative for ringing in ear or discharge Eyes: Negative for double vision or worsening visual disturbance.  Respiratory: Negative for choking and stridor.   Gastrointestinal: Negative for vomiting or other signifcant bowel change Genitourinary: Negative for hematuria or change in urine volume.  Musculoskeletal: Negative for other MSK pain or swelling Skin: Negative for color change and worsening wound.  Neurological: Negative  for tremors and numbness other than noted  Psychiatric/Behavioral: Negative for decreased concentration or agitation other than above       Objective:   Physical Exam BP 112/72 mmHg  Pulse 81  Temp(Src) 98.7 F (37.1 C) (Oral)  Resp 20  Ht 5\' 6"  (1.676 m)  Wt 226 lb (102.513 kg)  BMI 36.49 kg/m2  SpO2 97% VS noted,  Constitutional: Pt appears in no significant distress HENT: Head: NCAT.  Right Ear: External ear normal.  Left Ear: External ear normal.  Eyes: . Pupils are equal, round, and reactive to light. Conjunctivae and EOM are normal Neck: Normal range of motion. Neck supple.  Cardiovascular: Normal rate and regular rhythm.   Pulmonary/Chest: Effort normal and breath sounds without rales or wheezing.  Abd:  Soft,  ND, + BS but has some soreness to diffuse abd Neurological: Pt is alert. Not confused , motor grossly intact Skin: Skin is warm. No rash, no LE edema Psychiatric: Pt behavior is normal. No agitation.  Left first MTP with 3+ red/tender/swelling with diffuse swelling of whole dorsal foot to ankle - o/with neurovasc intact    Assessment & Plan:

## 2014-03-26 NOTE — Assessment & Plan Note (Signed)
stable overall by history and exam, recent data reviewed with pt, and pt to continue medical treatment as before,  to f/u any worsening symptoms or concerns ble BP Readings from Last 3 Encounters:  03/26/14 112/72  02/26/14 125/70  01/14/14 112/68

## 2014-03-26 NOTE — Assessment & Plan Note (Signed)
C/w msk post fall, for toradol IM  X 1,  to f/u any worsening symptoms or concerns

## 2014-03-26 NOTE — Progress Notes (Signed)
Pre visit review using our clinic review tool, if applicable. No additional management support is needed unless otherwise documented below in the visit note. 

## 2014-04-02 ENCOUNTER — Ambulatory Visit (INDEPENDENT_AMBULATORY_CARE_PROVIDER_SITE_OTHER): Payer: Medicare Other | Admitting: Cardiovascular Disease

## 2014-04-02 ENCOUNTER — Encounter: Payer: Self-pay | Admitting: Cardiovascular Disease

## 2014-04-02 VITALS — BP 92/62 | HR 72 | Ht 66.0 in | Wt 225.0 lb

## 2014-04-02 DIAGNOSIS — I10 Essential (primary) hypertension: Secondary | ICD-10-CM | POA: Diagnosis not present

## 2014-04-02 DIAGNOSIS — I2510 Atherosclerotic heart disease of native coronary artery without angina pectoris: Secondary | ICD-10-CM | POA: Diagnosis not present

## 2014-04-02 DIAGNOSIS — E785 Hyperlipidemia, unspecified: Secondary | ICD-10-CM | POA: Diagnosis not present

## 2014-04-02 MED ORDER — ATORVASTATIN CALCIUM 20 MG PO TABS: 20.0000 mg | ORAL_TABLET | Freq: Every day | ORAL | Status: DC

## 2014-04-02 NOTE — Patient Instructions (Signed)
Your physician has recommended you make the following change in your medication:  1. START Atorvastatin 20mg  take one by mouth every evening  Your physician recommends that you return for a FASTING LIPID and LIVER in 3 MONTHS--nothing to eat or drink after midnight, lab opens a 7:30AM  Your physician wants you to follow-up in: 6 MONTHS with Dr Burt Knack.  You will receive a reminder letter in the mail two months in advance. If you don't receive a letter, please call our office to schedule the follow-up appointment.

## 2014-04-02 NOTE — Progress Notes (Signed)
Cardiology Office Note   Date:  04/02/2014   ID:  [name], DOB 1945/11/12, MRN [id number]  PCP:  [name], MD  Cardiologist:  Sherren Mocha, MD    Chief Complaint  Patient presents with  . Chest Pain    acid reflux     History of Present Illness: [name] is a 69 y.o. female who presents for follow-up of coronary artery disease. The patient has had previous stenting of the LAD and left circumflex. She presented with symptoms concerning for unstable angina in February 2016. Cardiac catheterization demonstrated patency of her stent sites and mild nonobstructive CAD.  She complains of 'chest tingling' and indigestion at times. She doesn't exercise. Also complains of shortness of breath with exertion and generalized fatigue. Feels better with rest.   Past Medical History  Diagnosis Date  . Coronary artery disease     a. s/p inferior MI 1998->PCI of RCA. b. S/p prior PCI of the OM1 and LAD;  c. 05/2013 Cath/PCI: LM nl, LAD 90p (3.0x22 Resolute DES), 66m w/ patent stent, Diag 40p, 57m, LCX patent OM1 stent, RCA 50p, 36m, EF 65%.  . Hyperlipidemia   . Hypertension   . Obesity, unspecified   . Hypothyroidism   . Diabetes mellitus     TYPE II  . Anxiety state, unspecified   . Depressive disorder, not elsewhere classified   . Degeneration of lumbar or lumbosacral intervertebral disc   . Allergic rhinitis, cause unspecified   . Lower GI bleed 06/2010    Diverticular bleed  . Chronic LBP   . AAA (abdominal aortic aneurysm)     a. 4.7cm in 08/2012. Followed by [facility].  . Gout 08/22/2013  . Noncompliance with medications 02/26/2014    Past Surgical History  Procedure Laterality Date  . Total abdominal hysterectomy    . Lumbar fusion  01/2007    DR. Patrice Paradise...3-LEVEL WITH FIXATION  . Parathyroidectomy    . Thyroidectomy    . Cardiac catheterization      PCI OF BOTH THE CIRCUMFLEX AND LEFT ANTERIOR DESCENDING ARTERY  . Cesarean section    . Heart stent  04-2010  and  Jun 07, 2013    X 3  . Spine surgery    . Left heart catheterization with coronary angiogram N/A 06/07/2013    Procedure: LEFT HEART CATHETERIZATION WITH CORONARY ANGIOGRAM;  Surgeon: [name], MD;  Location: [facility] CATH LAB;  Service: Cardiovascular;  Laterality: N/A;  . Left heart catheterization with coronary angiogram N/A 02/25/2014    Procedure: LEFT HEART CATHETERIZATION WITH CORONARY ANGIOGRAM;  Surgeon: Troy Sine, MD;  Location: [facility] CATH LAB;  Service: Cardiovascular;  Laterality: N/A;    Current Outpatient Prescriptions  Medication Sig Dispense Refill  . acetaminophen (TYLENOL) 325 MG tablet Take 2 tablets (650 mg total) by mouth every 4 (four) hours as needed for headache or mild pain.    [name] allopurinol (ZYLOPRIM) 300 MG tablet Take 1 tablet (300 mg total) by mouth daily. 90 tablet 3  . aspirin EC 81 MG tablet Take 1 tablet (81 mg total) by mouth daily. For 1 month.  Then resume taking 4 times a week (tue thur sat sun)    . atenolol (TENORMIN) 25 MG tablet Take 1 tablet (25 mg total) by mouth 2 (two) times daily. 180 tablet 3  . cetirizine (ZYRTEC) 10 MG chewable tablet Chew 10 mg by mouth daily as needed for allergies.    [name] gabapentin (NEURONTIN) 300 MG  capsule Take 300 mg by mouth 3 (three) times daily as needed (Patient takes 300mg  by mouth three times daily as needed for nerve pain).    [name] HYDROcodone-homatropine (HYCODAN) 5-1.5 MG/5ML syrup Take 5 mLs by mouth every 6 (six) hours as needed for cough. 180 mL 0  . hydrOXYzine (ATARAX/VISTARIL) 10 MG tablet Take 1 tablet (10 mg total) by mouth 3 (three) times daily as needed for itching. 90 tablet 1  . meloxicam (MOBIC) 7.5 MG tablet Take 1 tablet (7.5 mg total) by mouth daily. 7 tablet 0  . metFORMIN (GLUCOPHAGE-XR) 500 MG 24 hr tablet Take 4 tablets (2,000 mg total) by mouth daily with breakfast.    . nitroGLYCERIN (NITROSTAT) 0.4 MG SL tablet Place 1 tablet (0.4 mg total) under the tongue every 5 (five) minutes as needed. 25  tablet 1  . oxyCODONE (OXY IR/ROXICODONE) 5 MG immediate release tablet 1-2 tabs every 6 hours as needed for pain, with limit 4 tabs per day - 30 tablet 0  . predniSONE (DELTASONE) 10 MG tablet 4tabs by mouth x 3day,3tab x 3day,2tab x 3day,1tab x 3day 30 tablet 0  . SYNTHROID 150 MCG tablet TAKE 1 TABLET BY MOUTH EVERY DAY 90 tablet 3  . tiZANidine (ZANAFLEX) 4 MG tablet Take 1 tablet (4 mg total) by mouth every 6 (six) hours as needed for muscle spasms. 40 tablet 1  . traMADol (ULTRAM) 50 MG tablet Take 1 tablet (50 mg total) by mouth every 6 (six) hours as needed for pain. 60 tablet 1  . atorvastatin (LIPITOR) 20 MG tablet Take 1 tablet (20 mg total) by mouth daily. 30 tablet 11   No current facility-administered medications for this visit.    Allergies:   Prilosec; Miconazole nitrate; Augmentin; and Doxycycline   Social History:  The patient  reports that she quit smoking about 27 years ago. Her smoking use included Cigarettes. She quit after 30 years of use. She has never used smokeless tobacco. She reports that she does not drink alcohol or use illicit drugs.   Family History:  The patient's family history includes Arthritis in her maternal aunt; Cancer in her maternal aunt; Diabetes in her brother and maternal aunt; Heart attack (age of onset: 56) in her mother; Heart attack (age of onset: 59) in her father; Heart disease in her father and mother. There is no history of Colon cancer.    ROS:  Please see the history of present illness.  Otherwise, review of systems is positive for back pain.  All other systems are reviewed and negative.    PHYSICAL EXAM: VS:  BP 92/62 mmHg  Pulse 72  Ht 5\' 6"  (1.676 m)  Wt 225 lb (102.059 kg)  BMI 36.33 kg/m2 , BMI Body mass index is 36.33 kg/(m^2). GEN: Well nourished, well developed, in no acute distress HEENT: normal Neck: no JVD, no masses. No carotid bruits Cardiac: RRR without murmur or gallop                Respiratory:  clear to  auscultation bilaterally, normal work of breathing GI: soft, nontender, nondistended, + BS MS: no deformity or atrophy Ext: no pretibial edema, pedal pulses 2+= bilaterally Skin: warm and dry, no rash Neuro:  Strength and sensation are intact Psych: euthymic mood, full affect  EKG:  EKG is ordered today. The ekg ordered today shows NSR 72 bpm, left axis deviation, no significant ST changes  Recent Labs: 06/06/2013: Pro B Natriuretic peptide (BNP) 39.9; TSH 1.920 08/22/2013: ALT  35 02/25/2014: BUN 14; Creatinine 1.08; Potassium 3.7; Sodium 139 02/26/2014: Hemoglobin 13.2; Platelets 221   Lipid Panel     Component Value Date/Time   CHOL 184 08/22/2013 1451   TRIG 170.0* 08/22/2013 1451   TRIG 102 01/02/2006 0825   HDL 32.00* 08/22/2013 1451   CHOLHDL 6 08/22/2013 1451   CHOLHDL 3.3 CALC 01/02/2006 0825   VLDL 34.0 08/22/2013 1451   LDLCALC 118* 08/22/2013 1451   LDLDIRECT 73.1 02/27/2012 1122      Wt Readings from Last 3 Encounters:  04/02/14 225 lb (102.059 kg)  03/26/14 226 lb (102.513 kg)  02/25/14 224 lb 13.9 oz (102 kg)     Cardiac Studies Reviewed: Cardiac catheterization 02/25/2014: IMPRESSION:  Normal LV function with an ejection fraction of 55-60% and minimal residual focal mid inferior hypocontractility.  Mild not significantly obstructive CAD with widely patent stents in the LAD, circumflex marginal vessel, and 40% diffuse narrowing in the proximal RCA.  RECOMMENDATION:  Medical therapy.  ASSESSMENT AND PLAN: 1.  CAD, native vessel: recent cath reviewed and shows patency of her stents and no obstructive CAD. She tells me today that she's never taken Brilinta because of cost. Last PCI was in May 2015. Recent cath as above. At this point would keep her on ASA alone. She states 'I can't take plavix.' Discussed risk modification with her at length.  2. HTN, essential: BP controlled. On my recheck BP is 98/64.   3. Hyperlipidemia: Not taking Crestor secondary to  cost. Advised atorvastatin 20 mg daily. Not sure that she will take. Repeat lipids/lft's 3 months.  4. AAA: followed by Dr Early  Current medicines are reviewed with the patient today.  The patient does not have concerns regarding medicines.  The following changes have been made:  See above  Labs/ tests ordered today include:   Orders Placed This Encounter  Procedures  . Lipid panel  . Hepatic function panel  . EKG 12-Lead   Disposition:   FU 6 months  Signed, Sherren Mocha, MD  04/02/2014 11:37 PM    [facility] [address], [place], [place]  [address] Phone: [phone]; Fax: [phone]

## 2014-04-03 ENCOUNTER — Telehealth: Payer: Self-pay | Admitting: Internal Medicine

## 2014-04-03 NOTE — Telephone Encounter (Signed)
Pt. States that her lip as been swelling up for the past few days since she has been taking the prednisone. I advised the patient if she noticed any SOB or hives, to go straight to the ER since shes having an allergic reaction. She said that she felt fine but that the lip was concerning her. Is there anything else she can do for the lip problems?

## 2014-04-03 NOTE — Telephone Encounter (Signed)
EASE NOTE: All timestamps contained within this report are represented as [place] Standard Time. CONFIDENTIALTY NOTICE: This fax transmission is intended only for the addressee. It contains information that is legally privileged, confidential or otherwise protected from use or disclosure. If you are not the intended recipient, you are strictly prohibited from reviewing, disclosing, copying using or disseminating any of this information or taking any action in reliance on or regarding this information. If you have received this fax in error, please notify us immediately by telephone so that we can arrange for its return to [place]. Phone: [phone], Toll-Free: [phone], Fax: [phone] Page: 1 of 1 Call Id: [id number] [facility] - Client [place] Patient Name: [name] DOB: 03-10-1945 Initial Comment Caller states her lips are swollen she is on prednisone Nurse Assessment Guidelines Guideline Title Affirmed Question Affirmed Notes Face Swelling Mild facial swelling of unknown cause (all triage questions negative) Final Disposition [place], RN, Myna Hidalgo Comments Advised pt to call back to the office and request guidance from Dr John/Dr Gwynn Burly staff, regarding their advice on what to do with reference to the last 3 pills of the Prednisone taper that was originally ordered. ( Instructed pt on the importance of this given the usual need for continuity with a Prednisone Taper)

## 2014-04-03 NOTE — Telephone Encounter (Signed)
Called patient back and left message. Will wait for her response.

## 2014-04-03 NOTE — Telephone Encounter (Signed)
Since prednisone is the treatment for allergic reactions, this is not likely the cause  OK to cont meds, also for benadryl 50 gm prn, f/u OV if persistent or worsening symptoms

## 2014-04-03 NOTE — Telephone Encounter (Signed)
Would prednisone cause lip swelling.

## 2014-04-06 ENCOUNTER — Encounter (HOSPITAL_COMMUNITY): Payer: Self-pay

## 2014-04-06 ENCOUNTER — Inpatient Hospital Stay (HOSPITAL_COMMUNITY)
Admission: EM | Admit: 2014-04-06 | Discharge: 2014-04-07 | DRG: 872 | Disposition: A | Discharge status: Home / Self Care | Payer: Medicare Other | Attending: Internal Medicine | Admitting: Internal Medicine

## 2014-04-06 ENCOUNTER — Emergency Department (HOSPITAL_COMMUNITY): Payer: Medicare Other

## 2014-04-06 ENCOUNTER — Inpatient Hospital Stay (HOSPITAL_COMMUNITY): Payer: Medicare Other

## 2014-04-06 DIAGNOSIS — Z8249 Family history of ischemic heart disease and other diseases of the circulatory system: Secondary | ICD-10-CM | POA: Diagnosis not present

## 2014-04-06 DIAGNOSIS — E039 Hypothyroidism, unspecified: Secondary | ICD-10-CM | POA: Diagnosis present

## 2014-04-06 DIAGNOSIS — J039 Acute tonsillitis, unspecified: Secondary | ICD-10-CM | POA: Diagnosis not present

## 2014-04-06 DIAGNOSIS — G8929 Other chronic pain: Secondary | ICD-10-CM | POA: Diagnosis present

## 2014-04-06 DIAGNOSIS — J301 Allergic rhinitis due to pollen: Secondary | ICD-10-CM | POA: Diagnosis present

## 2014-04-06 DIAGNOSIS — I2510 Atherosclerotic heart disease of native coronary artery without angina pectoris: Secondary | ICD-10-CM | POA: Diagnosis present

## 2014-04-06 DIAGNOSIS — R0602 Shortness of breath: Secondary | ICD-10-CM | POA: Diagnosis not present

## 2014-04-06 DIAGNOSIS — Z9114 Patient's other noncompliance with medication regimen: Secondary | ICD-10-CM | POA: Diagnosis present

## 2014-04-06 DIAGNOSIS — E785 Hyperlipidemia, unspecified: Secondary | ICD-10-CM | POA: Diagnosis present

## 2014-04-06 DIAGNOSIS — R221 Localized swelling, mass and lump, neck: Secondary | ICD-10-CM | POA: Diagnosis not present

## 2014-04-06 DIAGNOSIS — M109 Gout, unspecified: Secondary | ICD-10-CM | POA: Diagnosis present

## 2014-04-06 DIAGNOSIS — J029 Acute pharyngitis, unspecified: Secondary | ICD-10-CM | POA: Diagnosis present

## 2014-04-06 DIAGNOSIS — Z881 Allergy status to other antibiotic agents status: Secondary | ICD-10-CM | POA: Diagnosis not present

## 2014-04-06 DIAGNOSIS — Z7982 Long term (current) use of aspirin: Secondary | ICD-10-CM | POA: Diagnosis not present

## 2014-04-06 DIAGNOSIS — E669 Obesity, unspecified: Secondary | ICD-10-CM | POA: Diagnosis present

## 2014-04-06 DIAGNOSIS — D72829 Elevated white blood cell count, unspecified: Secondary | ICD-10-CM | POA: Diagnosis not present

## 2014-04-06 DIAGNOSIS — J0510 Acute epiglottitis without obstruction: Secondary | ICD-10-CM | POA: Diagnosis present

## 2014-04-06 DIAGNOSIS — T783XXA Angioneurotic edema, initial encounter: Secondary | ICD-10-CM | POA: Insufficient documentation

## 2014-04-06 DIAGNOSIS — E119 Type 2 diabetes mellitus without complications: Secondary | ICD-10-CM | POA: Diagnosis present

## 2014-04-06 DIAGNOSIS — M545 Low back pain: Secondary | ICD-10-CM | POA: Diagnosis present

## 2014-04-06 DIAGNOSIS — Z87891 Personal history of nicotine dependence: Secondary | ICD-10-CM | POA: Diagnosis not present

## 2014-04-06 DIAGNOSIS — Z7952 Long term (current) use of systemic steroids: Secondary | ICD-10-CM

## 2014-04-06 DIAGNOSIS — I714 Abdominal aortic aneurysm, without rupture: Secondary | ICD-10-CM | POA: Diagnosis present

## 2014-04-06 DIAGNOSIS — A419 Sepsis, unspecified organism: Principal | ICD-10-CM | POA: Diagnosis present

## 2014-04-06 DIAGNOSIS — Z809 Family history of malignant neoplasm, unspecified: Secondary | ICD-10-CM | POA: Diagnosis not present

## 2014-04-06 DIAGNOSIS — R600 Localized edema: Secondary | ICD-10-CM | POA: Diagnosis not present

## 2014-04-06 DIAGNOSIS — Z888 Allergy status to other drugs, medicaments and biological substances status: Secondary | ICD-10-CM

## 2014-04-06 DIAGNOSIS — Z79899 Other long term (current) drug therapy: Secondary | ICD-10-CM

## 2014-04-06 DIAGNOSIS — J392 Other diseases of pharynx: Secondary | ICD-10-CM | POA: Diagnosis not present

## 2014-04-06 DIAGNOSIS — Z955 Presence of coronary angioplasty implant and graft: Secondary | ICD-10-CM

## 2014-04-06 DIAGNOSIS — Z79891 Long term (current) use of opiate analgesic: Secondary | ICD-10-CM

## 2014-04-06 DIAGNOSIS — F411 Generalized anxiety disorder: Secondary | ICD-10-CM | POA: Diagnosis present

## 2014-04-06 DIAGNOSIS — Z833 Family history of diabetes mellitus: Secondary | ICD-10-CM

## 2014-04-06 DIAGNOSIS — Z8261 Family history of arthritis: Secondary | ICD-10-CM

## 2014-04-06 DIAGNOSIS — I252 Old myocardial infarction: Secondary | ICD-10-CM

## 2014-04-06 DIAGNOSIS — T783XXD Angioneurotic edema, subsequent encounter: Secondary | ICD-10-CM | POA: Diagnosis not present

## 2014-04-06 DIAGNOSIS — F329 Major depressive disorder, single episode, unspecified: Secondary | ICD-10-CM | POA: Diagnosis present

## 2014-04-06 DIAGNOSIS — I10 Essential (primary) hypertension: Secondary | ICD-10-CM | POA: Diagnosis present

## 2014-04-06 DIAGNOSIS — Z90710 Acquired absence of both cervix and uterus: Secondary | ICD-10-CM

## 2014-04-06 DIAGNOSIS — Z6836 Body mass index (BMI) 36.0-36.9, adult: Secondary | ICD-10-CM

## 2014-04-06 DIAGNOSIS — R1310 Dysphagia, unspecified: Secondary | ICD-10-CM | POA: Diagnosis present

## 2014-04-06 LAB — CBC WITH DIFFERENTIAL/PLATELET
BASOS ABS: 0 10*3/uL (ref 0.0–0.1)
BASOS PCT: 0 % (ref 0–1)
EOS ABS: 0.2 10*3/uL (ref 0.0–0.7)
Eosinophils Relative: 1 % (ref 0–5)
HEMATOCRIT: 45.2 % (ref 36.0–46.0)
Hemoglobin: 14.8 g/dL (ref 12.0–15.0)
Lymphocytes Relative: 27 % (ref 12–46)
Lymphs Abs: 5.2 10*3/uL — ABNORMAL HIGH (ref 0.7–4.0)
MCH: 28.8 pg (ref 26.0–34.0)
MCHC: 32.7 g/dL (ref 30.0–36.0)
MCV: 88.1 fL (ref 78.0–100.0)
Monocytes Absolute: 1 10*3/uL (ref 0.1–1.0)
Monocytes Relative: 5 % (ref 3–12)
Neutro Abs: 12.8 10*3/uL — ABNORMAL HIGH (ref 1.7–7.7)
Neutrophils Relative %: 67 % (ref 43–77)
PLATELETS: 344 10*3/uL (ref 150–400)
RBC: 5.13 MIL/uL — ABNORMAL HIGH (ref 3.87–5.11)
RDW: 14.6 % (ref 11.5–15.5)
WBC: 19.2 10*3/uL — AB (ref 4.0–10.5)

## 2014-04-06 LAB — BASIC METABOLIC PANEL
Anion gap: 10 (ref 5–15)
BUN: 16 mg/dL (ref 6–23)
CO2: 26 mmol/L (ref 19–32)
CREATININE: 0.96 mg/dL (ref 0.50–1.10)
Calcium: 8.7 mg/dL (ref 8.4–10.5)
Chloride: 102 mmol/L (ref 96–112)
GFR calc Af Amer: 69 mL/min — ABNORMAL LOW (ref >90)
GFR, EST NON AFRICAN AMERICAN: 59 mL/min — AB (ref >90)
Glucose, Bld: 174 mg/dL — ABNORMAL HIGH (ref 70–99)
POTASSIUM: 4 mmol/L (ref 3.5–5.1)
Sodium: 138 mmol/L (ref 135–145)

## 2014-04-06 LAB — TROPONIN I: Troponin I: <0.03 ng/mL (ref <0.031)

## 2014-04-06 LAB — URINALYSIS, ROUTINE W REFLEX MICROSCOPIC
Bilirubin Urine: NEGATIVE
Glucose, UA: NEGATIVE mg/dL
Hgb urine dipstick: NEGATIVE
Ketones, ur: NEGATIVE mg/dL
NITRITE: POSITIVE — AB
PROTEIN: NEGATIVE mg/dL
Specific Gravity, Urine: 1.017 (ref 1.005–1.030)
UROBILINOGEN UA: 1 mg/dL (ref 0.0–1.0)
pH: 5.5 (ref 5.0–8.0)

## 2014-04-06 LAB — URINE MICROSCOPIC-ADD ON

## 2014-04-06 LAB — LACTIC ACID, PLASMA: LACTIC ACID, VENOUS: 2.2 mmol/L — AB (ref 0.5–2.0)

## 2014-04-06 LAB — MRSA PCR SCREENING: MRSA by PCR: NEGATIVE

## 2014-04-06 LAB — PROCALCITONIN: Procalcitonin: <0.1 ng/mL

## 2014-04-06 MED ORDER — MORPHINE SULFATE 2 MG/ML IJ SOLN
1.0000 mg | INTRAMUSCULAR | Status: DC | PRN
  Administered 2014-04-06 (×3): 1 mg via INTRAVENOUS
  Filled 2014-04-06 (×3): qty 1

## 2014-04-06 MED ORDER — FAMOTIDINE IN NACL 20-0.9 MG/50ML-% IV SOLN
20.0000 mg | Freq: Once | INTRAVENOUS | Status: AC
  Administered 2014-04-06: 20 mg via INTRAVENOUS
  Filled 2014-04-06: qty 50

## 2014-04-06 MED ORDER — SODIUM CHLORIDE 0.9 % IV SOLN
INTRAVENOUS | Status: DC
  Administered 2014-04-06: 11:00:00 via INTRAVENOUS

## 2014-04-06 MED ORDER — LEVOTHYROXINE SODIUM 75 MCG PO TABS
150.0000 ug | ORAL_TABLET | Freq: Every day | ORAL | Status: DC
  Filled 2014-04-06: qty 2

## 2014-04-06 MED ORDER — FAMOTIDINE IN NACL 20-0.9 MG/50ML-% IV SOLN
20.0000 mg | Freq: Two times a day (BID) | INTRAVENOUS | Status: DC
  Administered 2014-04-06 – 2014-04-07 (×3): 20 mg via INTRAVENOUS
  Filled 2014-04-06 (×3): qty 50

## 2014-04-06 MED ORDER — EPINEPHRINE 0.3 MG/0.3ML IJ SOAJ
0.3000 mg | Freq: Once | INTRAMUSCULAR | Status: AC
Start: 2014-04-06 — End: 2014-04-06
  Administered 2014-04-06: 0.3 mg via INTRAMUSCULAR
  Filled 2014-04-06: qty 0.3

## 2014-04-06 MED ORDER — IOHEXOL 300 MG/ML  SOLN
100.0000 mL | Freq: Once | INTRAMUSCULAR | Status: AC | PRN
  Administered 2014-04-06: 100 mL via INTRAVENOUS

## 2014-04-06 MED ORDER — SODIUM CHLORIDE 0.9 % IJ SOLN: 3.0000 mL | Freq: Two times a day (BID) | INTRAMUSCULAR | Status: DC

## 2014-04-06 MED ORDER — ACETAMINOPHEN 325 MG PO TABS: 650.0000 mg | ORAL_TABLET | Freq: Four times a day (QID) | ORAL | Status: DC | PRN

## 2014-04-06 MED ORDER — DIPHENHYDRAMINE HCL 50 MG/ML IJ SOLN
25.0000 mg | Freq: Once | INTRAMUSCULAR | Status: AC
  Administered 2014-04-06: 25 mg via INTRAVENOUS
  Filled 2014-04-06: qty 1

## 2014-04-06 MED ORDER — ATENOLOL 25 MG PO TABS
25.0000 mg | ORAL_TABLET | Freq: Two times a day (BID) | ORAL | Status: DC
  Filled 2014-04-06: qty 1

## 2014-04-06 MED ORDER — MORPHINE SULFATE 4 MG/ML IJ SOLN
4.0000 mg | Freq: Once | INTRAMUSCULAR | Status: AC
  Administered 2014-04-06: 4 mg via INTRAVENOUS
  Filled 2014-04-06: qty 1

## 2014-04-06 MED ORDER — SODIUM CHLORIDE 0.9 % IV SOLN
Freq: Once | INTRAVENOUS | Status: AC
  Administered 2014-04-06: 10:00:00 via INTRAVENOUS

## 2014-04-06 MED ORDER — CLINDAMYCIN PHOSPHATE 600 MG/50ML IV SOLN
600.0000 mg | Freq: Four times a day (QID) | INTRAVENOUS | Status: DC
Start: 2014-04-06 — End: 2014-04-07
  Administered 2014-04-06 – 2014-04-07 (×4): 600 mg via INTRAVENOUS
  Filled 2014-04-06 (×5): qty 50

## 2014-04-06 MED ORDER — ACETAMINOPHEN 650 MG RE SUPP: 650.0000 mg | Freq: Four times a day (QID) | RECTAL | Status: DC | PRN

## 2014-04-06 MED ORDER — ONDANSETRON HCL 4 MG PO TABS: 4.0000 mg | ORAL_TABLET | Freq: Four times a day (QID) | ORAL | Status: DC | PRN

## 2014-04-06 MED ORDER — LEVOTHYROXINE SODIUM 100 MCG IV SOLR
75.0000 ug | Freq: Every day | INTRAVENOUS | Status: DC
  Administered 2014-04-06 – 2014-04-07 (×2): 75 ug via INTRAVENOUS
  Filled 2014-04-06 (×2): qty 5

## 2014-04-06 MED ORDER — DIPHENHYDRAMINE HCL 50 MG/ML IJ SOLN
25.0000 mg | Freq: Four times a day (QID) | INTRAMUSCULAR | Status: DC | PRN
  Administered 2014-04-06: 25 mg via INTRAVENOUS
  Filled 2014-04-06: qty 1

## 2014-04-06 MED ORDER — ONDANSETRON HCL 4 MG/2ML IJ SOLN: 4.0000 mg | Freq: Four times a day (QID) | INTRAMUSCULAR | Status: DC | PRN

## 2014-04-06 NOTE — ED Notes (Signed)
Pt alert and oriented x4. Respirations even and unlabored, bilateral symmetrical rise and fall of chest. Skin warm and dry. In no acute distress. Denies needs.   

## 2014-04-06 NOTE — Progress Notes (Signed)
Please note new orders for lactic acid check and procalcitonin per sepsis protocol.  [name] [facility]

## 2014-04-06 NOTE — ED Notes (Signed)
Pt to CT

## 2014-04-06 NOTE — Progress Notes (Addendum)
At 2135 pt rang call bell stating that she was having chest pain. Upon arrival to the room, the RN observed pt to be holding the lower middle section of her chest and stating that she was hurting and needed a soda so that she could belch. Upon returning to the room with the soda, the pt requested medication for gas and sleep. Message was sent to the on call provider who requested a EKG based on pt cardiac history. When pt was notified of this, she began to become irate and insist that she needed her home meds including her NTG. The RN requested that the pt not take meds from home and allow [place] to follow hospital policy and procedure to take care of her. She proceeded to take the NTG sublingual anyway at approximately 2139. The call provider was notified of this information and asked to come speak with the patient. RN made the charge nurse aware who notified the house supervisor of patient's refusal to comply with hospital policy. An agreement was reached with the pt for the daughter to come and take her home medications home for the night. At this point, the patient is no longer complaining of chest pain and is waiting for serial troponin results.

## 2014-04-06 NOTE — ED Notes (Signed)
Pt presents with c/o allergic reaction. Pt was recently taking prednisone last week and started noticing that her lips were swelling 2 days ago. Pt called the doctor's office and was taken off of the prednisone. Pt took some benadryl but the swelling moved around her mouth. Pt woke up from her sleep several hours ago with tongue swelling and difficulty swallowing. Pt reports no trouble breathing at this time and is talking in complete sentences but speech is somewhat muffled.

## 2014-04-06 NOTE — H&P (Addendum)
[facility] History and Physical  [name] WOE:[id number] DOB: [birth year] DOA: 04/06/2014  Referring physician: ER physician PCP: [name], MD   Chief Complaint: difficulty swallowing   HPI:  69 year old female with past medical history of hypertension, hypothyroidism, dyslipidemia, diabetes who presented to [facility] ED with sudden onset swelling in the neck / throat area. She was on recent prednisone for gout and and reported this to be the only most recent new medication she took. She reported symptoms started with more difficulty swallowing, swelling in throat area. No fevers or chills. No other symptoms of respiratory distress. No chest pain, shortness of breath or palpitations. No nausea or vomiting. No cough. In ED, she was hemodynamically stable. She was given epinephrine, Pepcid and benadryl but steroids avoided due to concern that it may have been prednisone that was the triggering agent for throat swelling. CCM medicine saw the pt in consultations. Order placed for CT neck. Recommended starting clindamycin.  Assessment & Plan    Active Problems: Throat swelling, possible epiglottitis - less likely angioedema since patient does not have swollen lips or tongue. It seems mandibular swelling is more prominent rasing the concern for pharyngitis or epiglottitis. - Appreciate CCM seeing the pt in consultation - Follow up CT neck results, may need ENT consult - Start clindamycin - Continue Pepcid and benadryl - Avoid steroids since it may have been prednisone that caused the symptoms to being with.  - Monitor in SDU for now - Keep NPO, change all meds to IV if possible  Hypothyroidism - Use IV synthroid at half the PO dose  Essential hypertension - BP 91/55 so hold atenolol  Sepsis / Leukocytosis - Sepsis criteria met with tachycardia, tachypnea, hypotension, leukocytosis and concern for epiglottitis clinically  - Due to epiglottitis or steroids she was taking prior to  discharge - Started on clindamycin - Sepsis order set placed - Check lactic acid and procalcitonin level    DVT prophylaxis:  - SCD's bilaterally   Radiological Exams on Admission: No results found.   Code Status: Full Family Communication: Plan of care discussed with the patient  Disposition Plan: Admit for further evaluation; SDU   Brandi Lenz, MD  [facility] Pager [phone]  Review of Systems:  Constitutional: Negative for fever, chills and malaise/fatigue. Negative for diaphoresis.  HENT: per HPI.   Eyes: Negative for blurred vision, double vision, photophobia, pain, discharge and redness.  Respiratory: Negative for cough, hemoptysis, sputum production, shortness of breath, wheezing and stridor.   Cardiovascular: Negative for chest pain, palpitations, orthopnea, claudication and leg swelling.  Gastrointestinal: Negative for nausea, vomiting and abdominal pain. Negative for heartburn, constipation, blood in stool and melena.  Genitourinary: Negative for dysuria, urgency, frequency, hematuria and flank pain.  Musculoskeletal: Negative for myalgias, back pain, joint pain and falls.  Skin: Negative for itching and rash.  Neurological: Negative for dizziness and weakness. Negative for tingling, tremors, sensory change, speech change, focal weakness, loss of consciousness and headaches.  Endo/Heme/Allergies: Negative for environmental allergies and polydipsia. Does not bruise/bleed easily.  Psychiatric/Behavioral: Negative for suicidal ideas. The patient is not nervous/anxious.      Past Medical History  Diagnosis Date  . Coronary artery disease     a. s/p inferior MI 1998->PCI of RCA. b. S/p prior PCI of the OM1 and LAD;  c. 05/2013 Cath/PCI: LM nl, LAD 90p (3.0x22 Resolute DES), 28mw/ patent stent, Diag 40p, 738mLCX patent OM1 stent, RCA 50p, 4067mF 65%.  .[name]  Hyperlipidemia   . Hypertension   . Obesity, unspecified   . Hypothyroidism   . Diabetes mellitus     TYPE  II  . Anxiety state, unspecified   . Depressive disorder, not elsewhere classified   . Degeneration of lumbar or lumbosacral intervertebral disc   . Allergic rhinitis, cause unspecified   . Lower GI bleed 06/2010    Diverticular bleed  . Chronic LBP   . AAA (abdominal aortic aneurysm)     a. 4.7cm in 08/2012. Followed by [facility].  . Gout 08/22/2013  . Noncompliance with medications 02/26/2014   Past Surgical History  Procedure Laterality Date  . Total abdominal hysterectomy    . Lumbar fusion  01/2007    DR. Patrice Paradise...3-LEVEL WITH FIXATION  . Parathyroidectomy    . Thyroidectomy    . Cardiac catheterization      PCI OF BOTH THE CIRCUMFLEX AND LEFT ANTERIOR DESCENDING ARTERY  . Cesarean section    . Heart stent  04-2010  and  Jun 07, 2013    X 3  . Spine surgery    . Left heart catheterization with coronary angiogram N/A 06/07/2013    Procedure: LEFT HEART CATHETERIZATION WITH CORONARY ANGIOGRAM;  Surgeon: [name], MD;  Location[facility] CATH LAB;  Service: Cardiovascular;  Laterality: N/A;  . Left heart catheterization with coronary angiogram N/A 02/25/2014    Procedure: LEFT HEART CATHETERIZATION WITH CORONARY ANGIOGRAM;  Surgeon: Troy Sine, MD;  Location: [facility] CATH LAB;  Service: Cardiovascular;  Laterality: N/A;   Social History:  reports that she quit smoking about 27 years ago. Her smoking use included Cigarettes. She quit after 30 years of use. She has never used smokeless tobacco. She reports that she does not drink alcohol or use illicit drugs.  Allergies  Allergen Reactions  . Prilosec [Omeprazole] Other (See Comments)    Chest pain  . Miconazole Nitrate Hives    REACTION: hives  . Prednisone Swelling    Tongue/lip swelling.  . Augmentin [Amoxicillin-Pot Clavulanate] Hives, Itching and Rash  . Doxycycline Other (See Comments)    REACTION: gi upset    Family History:  Family History  Problem Relation Age of Onset  . Heart attack Mother 54    s/p D&C-CARDIAC  ARREST 1966  . Heart disease Mother   . Heart attack Father 53    1978 WITH MI  . Heart disease Father   . Colon cancer Neg Hx   . Diabetes Brother   . Diabetes Maternal Aunt   . Arthritis Maternal Aunt   . Cancer Maternal Aunt      Prior to Admission medications   Medication Sig Start Date End Date Taking? Authorizing Provider  acetaminophen (TYLENOL) 325 MG tablet Take 2 tablets (650 mg total) by mouth every 4 (four) hours as needed for headache or mild pain. 02/26/14  Yes Isaiah Serge, NP  aspirin EC 81 MG tablet Take 1 tablet (81 mg total) by mouth daily. For 1 month.  Then resume taking 4 times a week (tue thur sat sun) Patient taking differently: Take 81 mg by mouth as directed. 4 times a week (tue thur sat sun) 06/25/13  Yes Liliane Shi, PA-C  atenolol (TENORMIN) 25 MG tablet Take 1 tablet (25 mg total) by mouth 2 (two) times daily. 02/21/13  Yes Biagio Borg, MD  cetirizine (ZYRTEC) 10 MG chewable tablet Chew 10 mg by mouth daily as needed for allergies.   Yes Historical Provider, MD  gabapentin (  NEURONTIN) 300 MG capsule Take 300 mg by mouth 3 (three) times daily as needed (Patient takes 328m by mouth three times daily as needed for nerve pain).   Yes Historical Provider, MD  HYDROcodone-homatropine (HYCODAN) 5-1.5 MG/5ML syrup Take 5 mLs by mouth every 6 (six) hours as needed for cough. 09/25/13  Yes JBiagio Borg MD  hydrOXYzine (ATARAX/VISTARIL) 10 MG tablet Take 1 tablet (10 mg total) by mouth 3 (three) times daily as needed for itching. 11/22/12  Yes JBiagio Borg MD  meloxicam (MOBIC) 7.5 MG tablet Take 1 tablet (7.5 mg total) by mouth daily. Patient taking differently: Take 7.5 mg by mouth daily as needed for pain.  08/03/13  Yes MDebbrah Alar NP  metFORMIN (GLUCOPHAGE-XR) 500 MG 24 hr tablet Take 4 tablets (2,000 mg total) by mouth daily with breakfast. 02/28/14  Yes LIsaiah Serge NP  nitroGLYCERIN (NITROSTAT) 0.4 MG SL tablet Place 1 tablet (0.4 mg total) under the  tongue every 5 (five) minutes as needed. 08/16/13  Yes JBiagio Borg MD  oxyCODONE (OXY IR/ROXICODONE) 5 MG immediate release tablet 1-2 tabs every 6 hours as needed for pain, with limit 4 tabs per day - 03/26/14  Yes JBiagio Borg MD  SYNTHROID 150 MCG tablet TAKE 1 TABLET BY MOUTH EVERY DAY 02/10/14  Yes JBiagio Borg MD  tiZANidine (ZANAFLEX) 4 MG tablet Take 1 tablet (4 mg total) by mouth every 6 (six) hours as needed for muscle spasms. 08/13/13  Yes JBiagio Borg MD  traMADol (ULTRAM) 50 MG tablet Take 1 tablet (50 mg total) by mouth every 6 (six) hours as needed for pain. 02/27/12  Yes JBiagio Borg MD  allopurinol (ZYLOPRIM) 300 MG tablet Take 1 tablet (300 mg total) by mouth daily. Patient not taking: Reported on 04/06/2014 01/14/14   JBiagio Borg MD  atorvastatin (LIPITOR) 20 MG tablet Take 1 tablet (20 mg total) by mouth daily. Patient not taking: Reported on 04/06/2014 04/02/14   MSherren Mocha MD  predniSONE (DELTASONE) 10 MG tablet 4tabs by mouth x 3day,3tab x 3day,2tab x 3day,1tab x 3day Patient not taking: Reported on 04/06/2014 03/26/14   JBiagio Borg MD   Physical Exam: Filed Vitals:   04/06/14 0648 04/06/14 0700 04/06/14 0740 04/06/14 0830  BP: 130/80 122/82 117/74 124/81  Pulse: 89 93 121 98  Temp:      TempSrc:      Resp: '16 19 21 17  ' SpO2: 99% 97% 96% 98%    Physical Exam  Constitutional: Appears well-developed and well-nourished. No distress.  HENT: Normocephalic. submandibular swelling, no swelling tongue or lips Eyes: Conjunctivae and EOM are normal. PERRLA, no scleral icterus.  Neck: Normal ROM. Neck supple. No JVD. No tracheal deviation. No thyromegaly.  CVS: RRR, S1/S2 +, no murmurs, no gallops, no carotid bruit.  Pulmonary: Effort and breath sounds normal, no stridor, rhonchi, wheezes, rales.  Abdominal: Soft. BS +,  no distension, tenderness, rebound or guarding.  Musculoskeletal: Normal range of motion. No edema and no tenderness.  Lymphadenopathy: No  lymphadenopathy noted, cervical, inguinal. Neuro: Alert. Normal reflexes, muscle tone coordination. No focal neurologic deficits. Skin: Skin is warm and dry. No rash noted.  No erythema. No pallor.  Psychiatric: Normal mood and affect. Behavior, judgment, thought content normal.   Labs on Admission:  Basic Metabolic Panel:  Recent Labs Lab 04/06/14 0748  NA 138  K 4.0  CL 102  CO2 26  GLUCOSE 174*  BUN 16  CREATININE 0.96  CALCIUM 8.7   Liver Function Tests: No results for input(s): AST, ALT, ALKPHOS, BILITOT, PROT, ALBUMIN in the last 168 hours. No results for input(s): LIPASE, AMYLASE in the last 168 hours. No results for input(s): AMMONIA in the last 168 hours. CBC:  Recent Labs Lab 04/06/14 0748  WBC 19.2*  NEUTROABS 12.8*  HGB 14.8  HCT 45.2  MCV 88.1  PLT 344   Cardiac Enzymes: No results for input(s): CKTOTAL, CKMB, CKMBINDEX, TROPONINI in the last 168 hours. BNP: Invalid input(s): POCBNP CBG: No results for input(s): GLUCAP in the last 168 hours.  If 7PM-7AM, please contact night-coverage [email or web address] Password [email or web address] 04/06/2014, 9:12 AM

## 2014-04-06 NOTE — ED Provider Notes (Signed)
CSN: [id number]     Arrival date & time 04/06/14  0549 History   First MD Initiated Contact with Patient 04/06/14 [phone]     Chief Complaint  Patient presents with  . Allergic Reaction     (Consider location/radiation/quality/duration/timing/severity/associated sxs/prior Treatment) HPI [name] is a 69 year-old female with past medical history of CAD, hypertension, hyperlipidemia, hypothyroidism, diabetes, gout who presents the ER complaining of tongue swelling. Patient reports going to her PCP for an episode of gout last week, was placed on prednisone and noticed lip swelling 2 days ago. Patient called her doctor's office and was advised to discontinue the prednisone. Since discontinuing, patient has noticed associated lip swelling and tongue swelling. Patient reports trying to take Benadryl with no relief. Patient reports difficulty swallowing. Patient denies associated shortness of breath, nausea, vomiting, chest pain, abdominal pain.   Past Medical History  Diagnosis Date  . Coronary artery disease     a. s/p inferior MI 1998->PCI of RCA. b. S/p prior PCI of the OM1 and LAD;  c. 05/2013 Cath/PCI: LM nl, LAD 90p (3.0x22 Resolute DES), 100m w/ patent stent, Diag 40p, 39m, LCX patent OM1 stent, RCA 50p, 25m, EF 65%.  . Hyperlipidemia   . Hypertension   . Obesity, unspecified   . Hypothyroidism   . Diabetes mellitus     TYPE II  . Anxiety state, unspecified   . Depressive disorder, not elsewhere classified   . Degeneration of lumbar or lumbosacral intervertebral disc   . Allergic rhinitis, cause unspecified   . Lower GI bleed 06/2010    Diverticular bleed  . Chronic LBP   . AAA (abdominal aortic aneurysm)     a. 4.7cm in 08/2012. Followed by [facility].  . Gout 08/22/2013  . Noncompliance with medications 02/26/2014   Past Surgical History  Procedure Laterality Date  . Total abdominal hysterectomy    . Lumbar fusion  01/2007    DR. Patrice Paradise...3-LEVEL WITH FIXATION  . Parathyroidectomy    .  Thyroidectomy    . Cardiac catheterization      PCI OF BOTH THE CIRCUMFLEX AND LEFT ANTERIOR DESCENDING ARTERY  . Cesarean section    . Heart stent  04-2010  and  Jun 07, 2013    X 3  . Spine surgery    . Left heart catheterization with coronary angiogram N/A 06/07/2013    Procedure: LEFT HEART CATHETERIZATION WITH CORONARY ANGIOGRAM;  Surgeon: [name], MD;  Location: [facility] CATH LAB;  Service: Cardiovascular;  Laterality: N/A;  . Left heart catheterization with coronary angiogram N/A 02/25/2014    Procedure: LEFT HEART CATHETERIZATION WITH CORONARY ANGIOGRAM;  Surgeon: Troy Sine, MD;  Location: [facility] CATH LAB;  Service: Cardiovascular;  Laterality: N/A;   Family History  Problem Relation Age of Onset  . Heart attack Mother 23    s/p D&C-CARDIAC ARREST 1966  . Heart disease Mother   . Heart attack Father 60    1978 WITH MI  . Heart disease Father   . Colon cancer Neg Hx   . Diabetes Brother   . Diabetes Maternal Aunt   . Arthritis Maternal Aunt   . Cancer Maternal Aunt    History  Substance Use Topics  . Smoking status: Former Smoker -- 30 years    Types: Cigarettes    Quit date: 05/31/1986  . Smokeless tobacco: Never Used  . Alcohol Use: No   OB History    No data available     Review of Systems  Constitutional: Negative for fever.  HENT: Negative for trouble swallowing.        Tongue swelling Lip swelling  Eyes: Negative for visual disturbance.  Respiratory: Negative for shortness of breath.   Cardiovascular: Negative for chest pain.  Gastrointestinal: Negative for nausea, vomiting and abdominal pain.  Genitourinary: Negative for dysuria.  Musculoskeletal: Negative for neck pain.  Skin: Negative for rash.  Neurological: Negative for dizziness, weakness and numbness.  Psychiatric/Behavioral: Negative.       Allergies  Prilosec; Miconazole nitrate; Prednisone; Augmentin; and Doxycycline  Home Medications   Prior to Admission medications    Medication Sig Start Date End Date Taking? Authorizing Provider  acetaminophen (TYLENOL) 325 MG tablet Take 2 tablets (650 mg total) by mouth every 4 (four) hours as needed for headache or mild pain. 02/26/14  Yes Isaiah Serge, NP  aspirin EC 81 MG tablet Take 1 tablet (81 mg total) by mouth daily. For 1 month.  Then resume taking 4 times a week (tue thur sat sun) Patient taking differently: Take 81 mg by mouth as directed. 4 times a week (tue thur sat sun) 06/25/13  Yes Liliane Shi, PA-C  atenolol (TENORMIN) 25 MG tablet Take 1 tablet (25 mg total) by mouth 2 (two) times daily. 02/21/13  Yes Biagio Borg, MD  cetirizine (ZYRTEC) 10 MG chewable tablet Chew 10 mg by mouth daily as needed for allergies.   Yes Historical Provider, MD  gabapentin (NEURONTIN) 300 MG capsule Take 300 mg by mouth 3 (three) times daily as needed (Patient takes 300mg  by mouth three times daily as needed for nerve pain).   Yes Historical Provider, MD  HYDROcodone-homatropine (HYCODAN) 5-1.5 MG/5ML syrup Take 5 mLs by mouth every 6 (six) hours as needed for cough. 09/25/13  Yes Biagio Borg, MD  hydrOXYzine (ATARAX/VISTARIL) 10 MG tablet Take 1 tablet (10 mg total) by mouth 3 (three) times daily as needed for itching. 11/22/12  Yes Biagio Borg, MD  meloxicam (MOBIC) 7.5 MG tablet Take 1 tablet (7.5 mg total) by mouth daily. Patient taking differently: Take 7.5 mg by mouth daily as needed for pain.  08/03/13  Yes Debbrah Alar, NP  metFORMIN (GLUCOPHAGE-XR) 500 MG 24 hr tablet Take 4 tablets (2,000 mg total) by mouth daily with breakfast. 02/28/14  Yes Isaiah Serge, NP  nitroGLYCERIN (NITROSTAT) 0.4 MG SL tablet Place 1 tablet (0.4 mg total) under the tongue every 5 (five) minutes as needed. 08/16/13  Yes Biagio Borg, MD  oxyCODONE (OXY IR/ROXICODONE) 5 MG immediate release tablet 1-2 tabs every 6 hours as needed for pain, with limit 4 tabs per day - 03/26/14  Yes Biagio Borg, MD  SYNTHROID 150 MCG tablet TAKE 1 TABLET BY  MOUTH EVERY DAY 02/10/14  Yes Biagio Borg, MD  tiZANidine (ZANAFLEX) 4 MG tablet Take 1 tablet (4 mg total) by mouth every 6 (six) hours as needed for muscle spasms. 08/13/13  Yes Biagio Borg, MD  traMADol (ULTRAM) 50 MG tablet Take 1 tablet (50 mg total) by mouth every 6 (six) hours as needed for pain. 02/27/12  Yes Biagio Borg, MD  allopurinol (ZYLOPRIM) 300 MG tablet Take 1 tablet (300 mg total) by mouth daily. Patient not taking: Reported on 04/06/2014 01/14/14   Biagio Borg, MD  atorvastatin (LIPITOR) 20 MG tablet Take 1 tablet (20 mg total) by mouth daily. Patient not taking: Reported on 04/06/2014 04/02/14   Sherren Mocha, MD  predniSONE (DELTASONE) 10 MG  tablet 4tabs by mouth x 3day,3tab x 3day,2tab x 3day,1tab x 3day Patient not taking: Reported on 04/06/2014 03/26/14   Biagio Borg, MD   BP 111/63 mmHg  Pulse 91  Temp(Src) 98.4 F (36.9 C) (Oral)  Resp 18  SpO2 98% Physical Exam  Constitutional: She is oriented to person, place, and time. She appears well-developed and well-nourished. No distress.  HENT:  Head: Normocephalic and atraumatic.  Mouth/Throat: Uvula is midline, oropharynx is clear and moist and mucous membranes are normal. No trismus in the jaw. No uvula swelling. No oropharyngeal exudate, posterior oropharyngeal edema, posterior oropharyngeal erythema or tonsillar abscesses.  Moderate amount of swelling to tongue and soft tissues of cheek, sublingual space Mild amount of lip swelling noted unilaterally on left side. No stridor or notable swelling in the pharynx. Mild swelling noted around eyes bilaterally.  Eyes: Right eye exhibits no discharge. Left eye exhibits no discharge. No scleral icterus.  Neck: Normal range of motion.  Cardiovascular: Normal rate, regular rhythm, S1 normal, S2 normal and normal heart sounds.   No murmur heard. Pulmonary/Chest: Effort normal and breath sounds normal. No accessory muscle usage. No tachypnea. No respiratory distress.  Abdominal:  Soft. There is no tenderness.  Musculoskeletal: Normal range of motion. She exhibits no edema or tenderness.  Neurological: She is alert and oriented to person, place, and time. No cranial nerve deficit. Coordination normal.  Skin: Skin is warm and dry. No rash noted. She is not diaphoretic.  Psychiatric: She has a normal mood and affect.  Nursing note and vitals reviewed.   ED Course  Procedures (including critical care time) Labs Review Labs Reviewed  BASIC METABOLIC PANEL - Abnormal; Notable for the following:    Glucose, Bld 174 (*)    GFR calc non Af Amer 59 (*)    GFR calc Af Amer 69 (*)    All other components within normal limits  CBC WITH DIFFERENTIAL/PLATELET - Abnormal; Notable for the following:    WBC 19.2 (*)    RBC 5.13 (*)    Neutro Abs 12.8 (*)    Lymphs Abs 5.2 (*)    All other components within normal limits  URINALYSIS, ROUTINE W REFLEX MICROSCOPIC - Abnormal; Notable for the following:    APPearance CLOUDY (*)    Nitrite POSITIVE (*)    Leukocytes, UA TRACE (*)    All other components within normal limits  URINE MICROSCOPIC-ADD ON - Abnormal; Notable for the following:    Bacteria, UA MANY (*)    All other components within normal limits  MRSA PCR SCREENING    Imaging Review Dg Chest 2 View  04/06/2014   CLINICAL DATA:  Recent allergic reaction with shortness of Breath  EXAM: CHEST  2 VIEW  COMPARISON:  02/24/2014  FINDINGS: Cardiac shadow is within normal limits. The lungs are well aerated bilaterally. Minimal left basilar atelectasis is seen. No focal confluent infiltrate is noted. Degenerative changes of the thoracic spine are noted.  IMPRESSION: Minimal left basilar atelectasis.   Electronically Signed   By: Inez Catalina M.D.   On: 04/06/2014 09:37   Ct Soft Tissue Neck W Contrast  04/06/2014   CLINICAL DATA:  New new  EXAM: CT NECK WITH CONTRAST  TECHNIQUE: Multidetector CT imaging of the neck was performed using the standard protocol following the  bolus administration of intravenous contrast.  CONTRAST:  160mL OMNIPAQUE IOHEXOL 300 MG/ML  SOLN  COMPARISON:  Neck MRI 01/03/2005  FINDINGS: Pharynx and larynx: There is asymmetric  pharyngeal mucosa within the left and posterior hypopharynx in a region of the pa;intine tonsils extending to the piriform sinus . There is some mass effect with shift of the airway to the right. There is no evidence of airway compromise. There is no mucosal lesion or abscess identified within this soft tissue/mucosal thickening. Thickening occurs on images 47 through 56 of series 3 and also noted on coronal series 45, series 6.  Salivary glands: Normal salivary glands and parotid glands  Thyroid: Small.  Question thyroidectomy  Lymph nodes: Minimal adenopathy  Vascular: Carotid sheaths are normal.  Limited intracranial: Unremarkable  Visualized orbits: Unremarkable  Mastoids and visualized paranasal sinuses: Mastoid air cells are clear. Paranasal sinuses are clear.  Skeleton: No aggressive osseous lesion. Degenerative change of the spine  Upper chest: Unremarkable  IMPRESSION: 1. Pharyngeal mucosal edema within the left hypopharynx consistent with pharyngitis/ tonsillitis. Mucosal edema and extends the piriform sinus. No mucosal lesion identified. No evidence of abscess. Recommend appropriate therapy and if persistent symptoms recommend reimaging or direct visualization. 2. No airway compromise   Electronically Signed   By: Suzy Bouchard M.D.   On: 04/06/2014 09:45     EKG Interpretation None      MDM   Final diagnoses:  Neck swelling    69 year old female with angioedema, recently placed on prednisone as only new medication. Patient denies having any new other exposures, foods, products. On exam patient does have notable tongue swelling with swelling noted to the sublingual tissue as well as mild swelling of her throat. There does not appear to be any pharyngeal involvement. Patient is in no acute distress, however  there is concern for worsening swelling given the potential for airway compromise. Patient given Benadryl, Pepcid and appendectomy here in the ER, steroids withheld due to possible cause of patient's current angioedema being recently placed on prednisone. Consult medicine as well as critical care. Dr. Charlies Silvers with [facility] hospitalist to admit for angioedema not resolving in the ER after observation. Case discussed with Dr. Titus Mould with critical care to consult as well, who recommends follow-up with CT soft tissue neck with contrast. We'll place his order and admit to medicine. There is no airway compromise in the ED, no need for invasive or aggressive airway management at this time. The patient appears reasonably stabilized for admission considering the current resources, flow, and capabilities available in the ED at this time, and I doubt any other [facility] requiring further screening and/or treatment in the ED prior to admission.  BP 111/63 mmHg  Pulse 91  Temp(Src) 98.4 F (36.9 C) (Oral)  Resp 18  SpO2 98%  Signed,  Dahlia Bailiff, PA-C 11:17 AM  Patient seen and discussed with Dr. Delora Fuel, MD    Dahlia Bailiff, PA-C 32/54/98 2641  David Glick, MD 58/30/94 0768

## 2014-04-06 NOTE — ED Notes (Signed)
Report given to [place], [place]

## 2014-04-06 NOTE — ED Notes (Signed)
md at bedside

## 2014-04-06 NOTE — Consult Note (Addendum)
Name: [name] MRN: [id number] DOB: 1945-11-27    ADMISSION DATE:  04/06/2014 CONSULTATION DATE:  04/06/14  REFERRING MD :  Dr Roxanne Mins  CHIEF COMPLAINT:  Concern engioedema  BRIEF PATIENT DESCRIPTION: 69 yr old AAF throat swelling  SIGNIFICANT EVENTS   STUDIES:  3/20 ct neck>>>   HISTORY OF PRESENT ILLNESS: 68 , acute onset throat swelling. Not on acei but recent course prednisone for gout. Has tolerated pred in past. Has hay fever, reports recent fevers. Increases secretions slight. No drooling. At swallow feels tight. No distres. No changes in voice. NO pain associated. No GERD. No hemoptysis or wt loss. To ER, [facility] admission. Called for assistance. Feels little better when leaning forward. No sig viral exposures. Added that lip on lef swelled then resolved, then top righ tlip swelled then resolved, feels like base of tongue is swollen.  PAST MEDICAL HISTORY :   has a past medical history of Coronary artery disease; Hyperlipidemia; Hypertension; Obesity, unspecified; Hypothyroidism; Diabetes mellitus; Anxiety state, unspecified; Depressive disorder, not elsewhere classified; Degeneration of lumbar or lumbosacral intervertebral disc; Allergic rhinitis, cause unspecified; Lower GI bleed (06/2010); Chronic LBP; AAA (abdominal aortic aneurysm); Gout (08/22/2013); and Noncompliance with medications (02/26/2014).  has past surgical history that includes Total abdominal hysterectomy; Lumbar fusion (01/2007); Parathyroidectomy; Thyroidectomy; Cardiac catheterization; Cesarean section; HEART STENT (04-2010  and  Jun 07, 2013); Spine surgery; left heart catheterization with coronary angiogram (N/A, 06/07/2013); and left heart catheterization with coronary angiogram (N/A, 02/25/2014). Prior to Admission medications   Medication Sig Start Date End Date Taking? Authorizing Provider  acetaminophen (TYLENOL) 325 MG tablet Take 2 tablets (650 mg total) by mouth every 4 (four) hours as needed for headache or  mild pain. 02/26/14  Yes Isaiah Serge, NP  aspirin EC 81 MG tablet Take 1 tablet (81 mg total) by mouth daily. For 1 month.  Then resume taking 4 times a week (tue thur sat sun) Patient taking differently: Take 81 mg by mouth as directed. 4 times a week (tue thur sat sun) 06/25/13  Yes Liliane Shi, PA-C  atenolol (TENORMIN) 25 MG tablet Take 1 tablet (25 mg total) by mouth 2 (two) times daily. 02/21/13  Yes Biagio Borg, MD  cetirizine (ZYRTEC) 10 MG chewable tablet Chew 10 mg by mouth daily as needed for allergies.   Yes Historical Provider, MD  gabapentin (NEURONTIN) 300 MG capsule Take 300 mg by mouth 3 (three) times daily as needed (Patient takes 300mg  by mouth three times daily as needed for nerve pain).   Yes Historical Provider, MD  HYDROcodone-homatropine (HYCODAN) 5-1.5 MG/5ML syrup Take 5 mLs by mouth every 6 (six) hours as needed for cough. 09/25/13  Yes Biagio Borg, MD  hydrOXYzine (ATARAX/VISTARIL) 10 MG tablet Take 1 tablet (10 mg total) by mouth 3 (three) times daily as needed for itching. 11/22/12  Yes Biagio Borg, MD  meloxicam (MOBIC) 7.5 MG tablet Take 1 tablet (7.5 mg total) by mouth daily. Patient taking differently: Take 7.5 mg by mouth daily as needed for pain.  08/03/13  Yes Debbrah Alar, NP  metFORMIN (GLUCOPHAGE-XR) 500 MG 24 hr tablet Take 4 tablets (2,000 mg total) by mouth daily with breakfast. 02/28/14  Yes Isaiah Serge, NP  nitroGLYCERIN (NITROSTAT) 0.4 MG SL tablet Place 1 tablet (0.4 mg total) under the tongue every 5 (five) minutes as needed. 08/16/13  Yes Biagio Borg, MD  oxyCODONE (OXY IR/ROXICODONE) 5 MG immediate release tablet 1-2 tabs every 6  hours as needed for pain, with limit 4 tabs per day - 03/26/14  Yes Biagio Borg, MD  SYNTHROID 150 MCG tablet TAKE 1 TABLET BY MOUTH EVERY DAY 02/10/14  Yes Biagio Borg, MD  tiZANidine (ZANAFLEX) 4 MG tablet Take 1 tablet (4 mg total) by mouth every 6 (six) hours as needed for muscle spasms. 08/13/13  Yes Biagio Borg, MD   traMADol (ULTRAM) 50 MG tablet Take 1 tablet (50 mg total) by mouth every 6 (six) hours as needed for pain. 02/27/12  Yes Biagio Borg, MD  allopurinol (ZYLOPRIM) 300 MG tablet Take 1 tablet (300 mg total) by mouth daily. Patient not taking: Reported on 04/06/2014 01/14/14   Biagio Borg, MD  atorvastatin (LIPITOR) 20 MG tablet Take 1 tablet (20 mg total) by mouth daily. Patient not taking: Reported on 04/06/2014 04/02/14   Sherren Mocha, MD  predniSONE (DELTASONE) 10 MG tablet 4tabs by mouth x 3day,3tab x 3day,2tab x 3day,1tab x 3day Patient not taking: Reported on 04/06/2014 03/26/14   Biagio Borg, MD   Allergies  Allergen Reactions  . Prilosec [Omeprazole] Other (See Comments)    Chest pain  . Miconazole Nitrate Hives    REACTION: hives  . Prednisone Swelling    Tongue/lip swelling.  . Augmentin [Amoxicillin-Pot Clavulanate] Hives, Itching and Rash  . Doxycycline Other (See Comments)    REACTION: gi upset    FAMILY HISTORY:  family history includes Arthritis in her maternal aunt; Cancer in her maternal aunt; Diabetes in her brother and maternal aunt; Heart attack (age of onset: 74) in her mother; Heart attack (age of onset: 63) in her father; Heart disease in her father and mother. There is no history of Colon cancer. SOCIAL HISTORY:  reports that she quit smoking about 27 years ago. Her smoking use included Cigarettes. She quit after 30 years of use. She has never used smokeless tobacco. She reports that she does not drink alcohol or use illicit drugs.  REVIEW OF SYSTEMS:   Constitutional: Negative for fever, POS chills, NO weight loss, malaise/fatigue and diaphoresis.  HENT: Negative for hearing loss, ear pain, nosebleeds, pos for congestion, neg sore throat, neck pain, tinnitus and ear discharge.   Eyes: Negative for blurred vision, double vision, photophobia, pain, discharge and redness.  Respiratory: Negative for cough, hemoptysis, sputum production, shortness of breath, wheezing  and stridor.   Cardiovascular: Negative for chest pain, palpitations, orthopnea, claudication, leg swelling and PND.  Gastrointestinal: Negative for heartburn, nausea, vomiting, abdominal pain, diarrhea, constipation, blood in stool and melena.  Genitourinary: Negative for dysuria, urgency, frequency, hematuria and flank pain.  Musculoskeletal: Negative for myalgias, back pain, joint pain and falls.  Skin: Negative for itching and rash.  Neurological: Negative for dizziness, tingling, tremors, sensory change, speech change, focal weakness, seizures, loss of consciousness, weakness and headaches.  Endo/Heme/Allergies: Pos for environmental allergies and neg polydipsia. Does not bruise/bleed easily.  SUBJECTIVE: at swallow feel congested,NO SOB  VITAL SIGNS: Temp:  [98.9 F (37.2 C)] 98.9 F (37.2 C) (03/20 0559) Pulse Rate:  [89-121] 98 (03/20 0830) Resp:  [16-21] 17 (03/20 0830) BP: (117-130)/(74-82) 124/81 mmHg (03/20 0830) SpO2:  [96 %-100 %] 98 % (03/20 0830)  PHYSICAL EXAMINATION: General:  No distress, speaking full paragraphs Neuro:  A o x 4, nonfocal HEENT:  Fullness submandibular rt greater left, nontender, nontfluctuant, not red or warm Mouth-tongue , lips wnl, malipati 2 Cardiovascular:  s1 s2 RRR Lungs:  CTA Abdomen:  Soft, obese, nt,  nd Musculoskeletal:  No edema, no rash Skin: no rash   Recent Labs Lab 04/06/14 0748  NA 138  K 4.0  CL 102  CO2 26  BUN 16  CREATININE 0.96  GLUCOSE 174*    Recent Labs Lab 04/06/14 0748  HGB 14.8  HCT 45.2  WBC 19.2*  PLT 344   No results found.  ASSESSMENT / PLAN:  Upper airway swelling concerns R/o epiglottis, r/o lymphadenopahty submandibular, r/o parotiditis R/o angioedema prednisone R/o neck infection ( unlikely)  -admit sdu / ICU -make ENT aware of her -CT neck -add abx, has allergy list -would hold off steroids for now given concerns prednisone started this reaction -benadryl, Pepcid  required -pcxr -check pulse ox -consider her long acting antihistamine  D/w ERD, Dr Drucilla Schmidt. Titus Mould, MD, [place] Pgr: [facility]  Pulmonary and [place] Pager: [phone]  04/06/2014, 8:59 AM

## 2014-04-06 NOTE — Progress Notes (Addendum)
[facility] hospitalist progress note. Chief complaint. Chest pain. History of present illness. This 69 year old female in hospital with throat swelling, possible epiglottitis. She has an extensive cardiac history including obesity, coronary artery disease, hyperlipidemia, hypertension and she indicates of prior MI with 4 prior stents. He complains to nursing of chest pain central chest without radiation, diaphoresis, or nausea. 12-lead EKG was requested and this has resulted. The EKG does not look significantly different from prior EKGs and does not look acutely ischemic. Patient does indicate that her prior EKGs during MI were also negative suggesting non-ST elevated MI in the past. Apparently the patient self-administered sublingual nitroglycerin from her purse and reports that she is chest pain-free at the time that I'm seeing her at bedside. Vital signs. Temperature 98.2, pulse 76, respiration 22, blood pressure 97/60. O2 sats 98%. General appearance. Obese elderly female who is alert and in no distress. Cardiac. Rate and rhythm regular. Lungs. Breath sounds are clear. Abdomen. Soft and obese with positive bowel sounds. No pain. Musculoskeletal. Pain nonreproducible with palpation over the sternum. Impression/plan. Problem #1. Chest pain. Patient indicates pain relief with one sublingual nitroglycerin which she took from her purse and self-administered. I will obtain a troponin now and then every 6 hours for a total of 3 sets. We'll need to contact cardiology for any troponin elevation. Will repeat a 12-lead EKG in approximately 6 hours to evaluate for any changes. Of note I found the patient somewhat disgruntled and angry about the fact she was told by nursing that hospital regulations did not permit self-medication. The patient ultimately agrees with policy.

## 2014-04-07 ENCOUNTER — Other Ambulatory Visit: Payer: Self-pay | Admitting: Internal Medicine

## 2014-04-07 DIAGNOSIS — J029 Acute pharyngitis, unspecified: Secondary | ICD-10-CM

## 2014-04-07 DIAGNOSIS — T783XXA Angioneurotic edema, initial encounter: Secondary | ICD-10-CM | POA: Insufficient documentation

## 2014-04-07 DIAGNOSIS — J039 Acute tonsillitis, unspecified: Secondary | ICD-10-CM

## 2014-04-07 DIAGNOSIS — T783XXD Angioneurotic edema, subsequent encounter: Secondary | ICD-10-CM

## 2014-04-07 LAB — CBC
HEMATOCRIT: 43.8 % (ref 36.0–46.0)
HEMOGLOBIN: 13.8 g/dL (ref 12.0–15.0)
MCH: 27.8 pg (ref 26.0–34.0)
MCHC: 31.5 g/dL (ref 30.0–36.0)
MCV: 88.1 fL (ref 78.0–100.0)
Platelets: 318 10*3/uL (ref 150–400)
RBC: 4.97 MIL/uL (ref 3.87–5.11)
RDW: 14.7 % (ref 11.5–15.5)
WBC: 11.6 10*3/uL — ABNORMAL HIGH (ref 4.0–10.5)

## 2014-04-07 LAB — COMPREHENSIVE METABOLIC PANEL
ALT: 20 U/L (ref 0–35)
AST: 20 U/L (ref 0–37)
Albumin: 3.3 g/dL — ABNORMAL LOW (ref 3.5–5.2)
Alkaline Phosphatase: 63 U/L (ref 39–117)
Anion gap: 9 (ref 5–15)
BUN: 10 mg/dL (ref 6–23)
CALCIUM: 8.2 mg/dL — AB (ref 8.4–10.5)
CO2: 25 mmol/L (ref 19–32)
Chloride: 102 mmol/L (ref 96–112)
Creatinine, Ser: 0.85 mg/dL (ref 0.50–1.10)
GFR calc non Af Amer: 69 mL/min — ABNORMAL LOW (ref >90)
GFR, EST AFRICAN AMERICAN: 80 mL/min — AB (ref >90)
Glucose, Bld: 123 mg/dL — ABNORMAL HIGH (ref 70–99)
Potassium: 4.3 mmol/L (ref 3.5–5.1)
Sodium: 136 mmol/L (ref 135–145)
Total Bilirubin: 1.1 mg/dL (ref 0.3–1.2)
Total Protein: 6.7 g/dL (ref 6.0–8.3)

## 2014-04-07 LAB — TROPONIN I
Troponin I: <0.03 ng/mL (ref <0.031)
Troponin I: <0.03 ng/mL (ref <0.031)

## 2014-04-07 LAB — LACTIC ACID, PLASMA: LACTIC ACID, VENOUS: 1.6 mmol/L (ref 0.5–2.0)

## 2014-04-07 MED ORDER — FAMOTIDINE 20 MG PO TABS: 20.0000 mg | ORAL_TABLET | Freq: Two times a day (BID) | ORAL | Status: DC

## 2014-04-07 MED ORDER — CLINDAMYCIN HCL 300 MG PO CAPS: 300.0000 mg | ORAL_CAPSULE | Freq: Three times a day (TID) | ORAL | Status: DC

## 2014-04-07 NOTE — Progress Notes (Signed)
Name: [name] MRN: [id number] DOB: 08-10-1945    ADMISSION DATE:  04/06/2014 CONSULTATION DATE:  04/06/14  REFERRING MD :  Dr Roxanne Mins  CHIEF COMPLAINT:  Concern engioedema  BRIEF PATIENT DESCRIPTION: 69 yr old AAF throat swelling  SIGNIFICANT EVENTS   STUDIES:  3/20 ct neck>>>Pharyngeal mucosal edema within the left hypopharynx consistent with pharyngitis/ tonsillitis. Mucosal edema and extends the piriform sinus   HISTORY OF PRESENT ILLNESS: 68 , acute onset throat swelling. Not on acei but recent course prednisone for gout. Has tolerated pred in past. Has hay fever, reports recent fevers. Increases secretions slight. No drooling. At swallow feels tight. No distres. No changes in voice. NO pain associated. No GERD. No hemoptysis or wt loss. To ER, [facility]. Called for assistance. Feels little better when leaning forward. No sig viral exposures. Added that lip on lef swelled then resolved, then top righ tlip swelled then resolved, feels like base of tongue is swollen.   SUBJECTIVE: wants to go home  VITAL SIGNS: Temp:  [98.2 F (36.8 C)-98.5 F (36.9 C)] 98.5 F (36.9 C) (03/21 0400) Pulse Rate:  [32-117] 79 (03/21 0600) Resp:  [14-23] 18 (03/21 0400) BP: (80-119)/(39-85) 114/65 mmHg (03/21 0540) SpO2:  [91 %-100 %] 94 % (03/21 0600) Weight:  [223 lb 1.7 oz (101.2 kg)] 223 lb 1.7 oz (101.2 kg) (03/20 1600)  PHYSICAL EXAMINATION: General:  No distress, speaking full sentences, angry wants to go home Neuro:  A o x 4, nonfocal HEENT:  Fullness submandibular rt greater left, nontender, nontfluctuant, not red or warm Mouth-tongue , lips wnl, malipati 2 Cardiovascular:  s1 s2 RRR Lungs:  CTA Abdomen:  Soft, obese, nt, nd Musculoskeletal:  No edema, no rash Skin: no rash   Recent Labs Lab 04/06/14 0748 04/07/14 0350  NA 138 136  K 4.0 4.3  CL 102 102  CO2 26 25  BUN 16 10  CREATININE 0.96 0.85  GLUCOSE 174* 123*    Recent Labs Lab 04/06/14 0748  04/07/14 0350  HGB 14.8 13.8  HCT 45.2 43.8  WBC 19.2* 11.6*  PLT 344 318   Dg Chest 2 View  04/06/2014   CLINICAL DATA:  Recent allergic reaction with shortness of Breath  EXAM: CHEST  2 VIEW  COMPARISON:  02/24/2014  FINDINGS: Cardiac shadow is within normal limits. The lungs are well aerated bilaterally. Minimal left basilar atelectasis is seen. No focal confluent infiltrate is noted. Degenerative changes of the thoracic spine are noted.  IMPRESSION: Minimal left basilar atelectasis.   Electronically Signed   By: Inez Catalina M.D.   On: 04/06/2014 09:37   Ct Soft Tissue Neck W Contrast  04/06/2014   CLINICAL DATA:  New new  EXAM: CT NECK WITH CONTRAST  TECHNIQUE: Multidetector CT imaging of the neck was performed using the standard protocol following the bolus administration of intravenous contrast.  CONTRAST:  125mL OMNIPAQUE IOHEXOL 300 MG/ML  SOLN  COMPARISON:  Neck MRI 01/03/2005  FINDINGS: Pharynx and larynx: There is asymmetric pharyngeal mucosa within the left and posterior hypopharynx in a region of the pa;intine tonsils extending to the piriform sinus . There is some mass effect with shift of the airway to the right. There is no evidence of airway compromise. There is no mucosal lesion or abscess identified within this soft tissue/mucosal thickening. Thickening occurs on images 47 through 56 of series 3 and also noted on coronal series 45, series 6.  Salivary glands: Normal salivary glands and parotid glands  Thyroid: Small.  Question thyroidectomy  Lymph nodes: Minimal adenopathy  Vascular: Carotid sheaths are normal.  Limited intracranial: Unremarkable  Visualized orbits: Unremarkable  Mastoids and visualized paranasal sinuses: Mastoid air cells are clear. Paranasal sinuses are clear.  Skeleton: No aggressive osseous lesion. Degenerative change of the spine  Upper chest: Unremarkable  IMPRESSION: 1. Pharyngeal mucosal edema within the left hypopharynx consistent with pharyngitis/  tonsillitis. Mucosal edema and extends the piriform sinus. No mucosal lesion identified. No evidence of abscess. Recommend appropriate therapy and if persistent symptoms recommend reimaging or direct visualization. 2. No airway compromise   Electronically Signed   By: Suzy Bouchard M.D.   On: 04/06/2014 09:45    ASSESSMENT / PLAN:  Upper airway swelling concerns secondary to Pharyngeal mucosal edema within the left hypopharynx consistent with pharyngitis/ tonsillitis. Mucosal edema and extends the piriform sinus by CT scan   -admit sdu / ICU -make ENT aware of her -CT neck as noted -clindamycin as ordered -ENT follow up  3/21 PCCM will sign off, NAD.  Richardson Landry Minor ACNP Maryanna Shape PCCM Pager [phone] till 3 pm If no answer page [phone]  Consult called for concern for angioedema, patient appears comfortable, CT of the neck noted, I reviewed it myself and no evidence of obstructive disease.  Continue clinda for epiglottitis.  Continue to monitor her.  PCCM will sign off, please call back if needed.  Patient seen and examined, agree with above note.  I dictated the care and orders written for this patient under my direction.  Rush Farmer, MD [phone]  04/07/2014, 9:50 AM

## 2014-04-07 NOTE — Discharge Instructions (Signed)
Tonsillitis Tonsillitis is an infection of the throat that causes the tonsils to become red, tender, and swollen. Tonsils are collections of lymphoid tissue at the back of the throat. Each tonsil has crevices (crypts). Tonsils help fight nose and throat infections and keep infection from spreading to other parts of the body for the first 18 months of life.  CAUSES Sudden (acute) tonsillitis is usually caused by infection with streptococcal bacteria. Long-lasting (chronic) tonsillitis occurs when the crypts of the tonsils become filled with pieces of food and bacteria, which makes it easy for the tonsils to become repeatedly infected. SYMPTOMS  Symptoms of tonsillitis include:  A sore throat, with possible difficulty swallowing.  White patches on the tonsils.  Fever.  Tiredness.  New episodes of snoring during sleep, when you did not snore before.  Small, foul-smelling, yellowish-white pieces of material (tonsilloliths) that you occasionally cough up or spit out. The tonsilloliths can also cause you to have bad breath. DIAGNOSIS Tonsillitis can be diagnosed through a physical exam. Diagnosis can be confirmed with the results of lab tests, including a throat culture. TREATMENT  The goals of tonsillitis treatment include the reduction of the severity and duration of symptoms and prevention of associated conditions. Symptoms of tonsillitis can be improved with the use of steroids to reduce the swelling. Tonsillitis caused by bacteria can be treated with antibiotic medicines. Usually, treatment with antibiotic medicines is started before the cause of the tonsillitis is known. However, if it is determined that the cause is not bacterial, antibiotic medicines will not treat the tonsillitis. If attacks of tonsillitis are severe and frequent, your health care provider may recommend surgery to remove the tonsils (tonsillectomy). HOME CARE INSTRUCTIONS   Rest as much as possible and get plenty of  sleep.  Drink plenty of fluids. While the throat is very sore, eat soft foods or liquids, such as sherbet, soups, or instant breakfast drinks.  Eat frozen ice pops.  Gargle with a warm or cold liquid to help soothe the throat. Mix 1/4 teaspoon of salt and 1/4 teaspoon of baking soda in 8 oz of water. SEEK MEDICAL CARE IF:   Large, tender lumps develop in your neck.  A rash develops.  A green, yellow-[place], or bloody substance is coughed up.  You are unable to swallow liquids or food for 24 hours.  You notice that only one of the tonsils is swollen. SEEK IMMEDIATE MEDICAL CARE IF:   You develop any new symptoms such as vomiting, severe headache, stiff neck, chest pain, or trouble breathing or swallowing.  You have severe throat pain along with drooling or voice changes.  You have severe pain, unrelieved with recommended medications.  You are unable to fully open the mouth.  You develop redness, swelling, or severe pain anywhere in the neck.  You have a fever. MAKE SURE YOU:   Understand these instructions.  Will watch your condition.  Will get help right away if you are not doing well or get worse. Document Released: 10/13/2004 Document Revised: 05/20/2013 Document Reviewed: 06/22/2012 [facility] Patient Information 2015 [place], [place]. This information is not intended to replace advice given to you by your health care provider. Make sure you discuss any questions you have with your health care provider. Pharyngitis Pharyngitis is redness, pain, and swelling (inflammation) of your pharynx.  CAUSES  Pharyngitis is usually caused by infection. Most of the time, these infections are from viruses (viral) and are part of a cold. However, sometimes pharyngitis is caused by bacteria (  bacterial). Pharyngitis can also be caused by allergies. Viral pharyngitis may be spread from person to person by coughing, sneezing, and personal items or utensils (cups, forks, spoons, toothbrushes).  Bacterial pharyngitis may be spread from person to person by more intimate contact, such as kissing.  SIGNS AND SYMPTOMS  Symptoms of pharyngitis include:   Sore throat.   Tiredness (fatigue).   Low-grade fever.   Headache.  Joint pain and muscle aches.  Skin rashes.  Swollen lymph nodes.  Plaque-like film on throat or tonsils (often seen with bacterial pharyngitis). DIAGNOSIS  Your health care provider will ask you questions about your illness and your symptoms. Your medical history, along with a physical exam, is often all that is needed to diagnose pharyngitis. Sometimes, a rapid strep test is done. Other lab tests may also be done, depending on the suspected cause.  TREATMENT  Viral pharyngitis will usually get better in 3-4 days without the use of medicine. Bacterial pharyngitis is treated with medicines that kill germs (antibiotics).  HOME CARE INSTRUCTIONS   Drink enough water and fluids to keep your urine clear or pale yellow.   Only take over-the-counter or prescription medicines as directed by your health care provider:   If you are prescribed antibiotics, make sure you finish them even if you start to feel better.   Do not take aspirin.   Get lots of rest.   Gargle with 8 oz of salt water ( tsp of salt per 1 qt of water) as often as every 1-2 hours to soothe your throat.   Throat lozenges (if you are not at risk for choking) or sprays may be used to soothe your throat. SEEK MEDICAL CARE IF:   You have large, tender lumps in your neck.  You have a rash.  You cough up green, yellow-[place], or bloody spit. SEEK IMMEDIATE MEDICAL CARE IF:   Your neck becomes stiff.  You drool or are unable to swallow liquids.  You vomit or are unable to keep medicines or liquids down.  You have severe pain that does not go away with the use of recommended medicines.  You have trouble breathing (not caused by a stuffy nose). MAKE SURE YOU:   Understand these  instructions.  Will watch your condition.  Will get help right away if you are not doing well or get worse. Document Released: 01/03/2005 Document Revised: 10/24/2012 Document Reviewed: 09/10/2012 [facility] Patient Information 2015 [place], [place]. This information is not intended to replace advice given to you by your health care provider. Make sure you discuss any questions you have with your health care provider.

## 2014-04-07 NOTE — Discharge Summary (Signed)
Physician Discharge Summary  Neetu EUN VERMEER EPP:[id number] DOB: December 07, 1945 DOA: 04/06/2014  PCP: [name], MD  Admit date: 04/06/2014 Discharge date: 04/07/2014  Recommendations for Outpatient Follow-up:  1. Take clindamycin for 10 days on discharge for pharyngitis and tonsilitis 2. Follow up with PCP per scheduled appt.  Discharge Diagnoses:  Active Problems:   Epiglottitis   Neck swelling    Discharge Condition: stable; spoke with ENT on call, Dr. Redmond Baseman who said if patient has no airway compromise and tolerates by mouth intake patient can go home on clindamycin for 10 days  Diet recommendation: as tolerated   History of present illness: 69 year old female with past medical history of hypertension, hypothyroidism, dyslipidemia, diabetes who presented to [facility] ED with sudden onset swelling in the neck / throat area. She was on recent prednisone for gout and and reported this to be the only most recent new medication she took. She reported symptoms started with more difficulty swallowing, swelling in throat area. No fevers or chills.   In ED, she was hemodynamically stable. She was given epinephrine, Pepcid and benadryl but steroids avoided due to concern that it may have been prednisone that was the triggering agent for throat swelling. CCM medicine saw the pt in consultations. She was started on clindamycin.  Assessment & Plan    Active Problems: Throat swelling, possible epiglottitis - Based on CT scan, pharyngitis/tonsillitis. I spoke with Dr. Redmond Baseman of ENT who recommended clindamycin. If patient has no airway compromise she can be discharge and follow up outpatient. - Prescription provided for clindamycin for 10 days on discharge. - Continue Pepcid twice daily.  Hypothyroidism - Continue Synthroid per prior home regimen  Essential hypertension - Continue atenolol per prior home regimen  Sepsis / Leukocytosis - Sepsis criteria met with tachycardia, tachypnea, hypotension,  leukocytosis and concern for epiglottitis clinically. CT scan showed pharyngitis/tonsillitis. In addition lactic acid was 2.2 on the admission. Lactic acid now within normal limits. Normal procalcitonin level. - Patient started on clindamycin since the admission. She will be discharged with clindamycin for 10 days. - Sepsis order set was placed on the admission. - Leukocytosis improved.   DVT prophylaxis:  - SCD's bilaterally in hospital.   Signed:  Leisa Lenz, MD  [facility] 04/07/2014, 10:02 AM  Pager #: [phone]    Discharge Exam: Filed Vitals:   04/07/14 0600  BP:   Pulse: 79  Temp:   Resp:    Filed Vitals:   04/07/14 0445 04/07/14 0500 04/07/14 0540 04/07/14 0600  BP:   114/65   Pulse: 80 32 79 79  Temp:      TempSrc:      Resp:      Height:      Weight:      SpO2: 94% 100% 94% 94%    General: Pt is alert, follows commands appropriately, not in acute distress Cardiovascular: Regular rate and rhythm, S1/S2 +, no murmurs Respiratory: Clear to auscultation bilaterally, no wheezing, no crackles, no rhonchi Abdominal: Soft, non tender, non distended, bowel sounds +, no guarding Extremities: no edema, no cyanosis, pulses palpable bilaterally DP and PT Neuro: Grossly nonfocal  Discharge Instructions  Discharge Instructions    Call MD for:  difficulty breathing, headache or visual disturbances    Complete by:  As directed      Call MD for:  hives    Complete by:  As directed      Call MD for:  persistant nausea and vomiting  Complete by:  As directed      Call MD for:  redness, tenderness, or signs of infection (pain, swelling, redness, odor or green/yellow discharge around incision site)    Complete by:  As directed      Call MD for:  severe uncontrolled pain    Complete by:  As directed      Diet - low sodium heart healthy    Complete by:  As directed      Discharge instructions    Complete by:  As directed   1. Take clindamycin for 10 days  on discharge for pharyngitis and tonsilitis 2. Follow up with PCP per scheduled appt.     Increase activity slowly    Complete by:  As directed             Medication List    STOP taking these medications        atorvastatin 20 MG tablet  Commonly known as:  LIPITOR     predniSONE 10 MG tablet  Commonly known as:  DELTASONE      TAKE these medications        acetaminophen 325 MG tablet  Commonly known as:  TYLENOL  Take 2 tablets (650 mg total) by mouth every 4 (four) hours as needed for headache or mild pain.     allopurinol 300 MG tablet  Commonly known as:  ZYLOPRIM  Take 1 tablet (300 mg total) by mouth daily.     aspirin EC 81 MG tablet  Take 1 tablet (81 mg total) by mouth daily. For 1 month.  Then resume taking 4 times a week (tue thur sat sun)     atenolol 25 MG tablet  Commonly known as:  TENORMIN  Take 1 tablet (25 mg total) by mouth 2 (two) times daily.     cetirizine 10 MG chewable tablet  Commonly known as:  ZYRTEC  Chew 10 mg by mouth daily as needed for allergies.     clindamycin 300 MG capsule  Commonly known as:  CLEOCIN  Take 1 capsule (300 mg total) by mouth 3 (three) times daily.     gabapentin 300 MG capsule  Commonly known as:  NEURONTIN  Take 300 mg by mouth 3 (three) times daily as needed (Patient takes 391m by mouth three times daily as needed for nerve pain).     HYDROcodone-homatropine 5-1.5 MG/5ML syrup  Commonly known as:  HYCODAN  Take 5 mLs by mouth every 6 (six) hours as needed for cough.     hydrOXYzine 10 MG tablet  Commonly known as:  ATARAX/VISTARIL  Take 1 tablet (10 mg total) by mouth 3 (three) times daily as needed for itching.     meloxicam 7.5 MG tablet  Commonly known as:  MOBIC  Take 1 tablet (7.5 mg total) by mouth daily.     metFORMIN 500 MG 24 hr tablet  Commonly known as:  GLUCOPHAGE-XR  Take 4 tablets (2,000 mg total) by mouth daily with breakfast.     nitroGLYCERIN 0.4 MG SL tablet  Commonly known as:   NITROSTAT  Place 1 tablet (0.4 mg total) under the tongue every 5 (five) minutes as needed.     oxyCODONE 5 MG immediate release tablet  Commonly known as:  Oxy IR/ROXICODONE  1-2 tabs every 6 hours as needed for pain, with limit 4 tabs per day -     SYNTHROID 150 MCG tablet  Generic drug:  levothyroxine  TAKE 1 TABLET BY MOUTH  EVERY DAY     tiZANidine 4 MG tablet  Commonly known as:  ZANAFLEX  Take 1 tablet (4 mg total) by mouth every 6 (six) hours as needed for muscle spasms.     traMADol 50 MG tablet  Commonly known as:  ULTRAM  Take 1 tablet (50 mg total) by mouth every 6 (six) hours as needed for pain.           Follow-up Information    Follow up with [name], MD. Schedule an appointment as soon as possible for a visit in 1 week.   Specialties:  Internal Medicine, Radiology   Why:  Follow up appt after recent hospitalization   Contact information:   [address] [place] [address] [phone]        The results of significant diagnostics from this hospitalization (including imaging, microbiology, ancillary and laboratory) are listed below for reference.    Significant Diagnostic Studies: Dg Chest 2 View  04/06/2014   CLINICAL DATA:  Recent allergic reaction with shortness of Breath  EXAM: CHEST  2 VIEW  COMPARISON:  02/24/2014  FINDINGS: Cardiac shadow is within normal limits. The lungs are well aerated bilaterally. Minimal left basilar atelectasis is seen. No focal confluent infiltrate is noted. Degenerative changes of the thoracic spine are noted.  IMPRESSION: Minimal left basilar atelectasis.   Electronically Signed   By: Inez Catalina M.D.   On: 04/06/2014 09:37   Ct Soft Tissue Neck W Contrast  04/06/2014   CLINICAL DATA:  New new  EXAM: CT NECK WITH CONTRAST  TECHNIQUE: Multidetector CT imaging of the neck was performed using the standard protocol following the bolus administration of intravenous contrast.  CONTRAST:  166m OMNIPAQUE IOHEXOL 300  MG/ML  SOLN  COMPARISON:  Neck MRI 01/03/2005  FINDINGS: Pharynx and larynx: There is asymmetric pharyngeal mucosa within the left and posterior hypopharynx in a region of the pa;intine tonsils extending to the piriform sinus . There is some mass effect with shift of the airway to the right. There is no evidence of airway compromise. There is no mucosal lesion or abscess identified within this soft tissue/mucosal thickening. Thickening occurs on images 47 through 56 of series 3 and also noted on coronal series 45, series 6.  Salivary glands: Normal salivary glands and parotid glands  Thyroid: Small.  Question thyroidectomy  Lymph nodes: Minimal adenopathy  Vascular: Carotid sheaths are normal.  Limited intracranial: Unremarkable  Visualized orbits: Unremarkable  Mastoids and visualized paranasal sinuses: Mastoid air cells are clear. Paranasal sinuses are clear.  Skeleton: No aggressive osseous lesion. Degenerative change of the spine  Upper chest: Unremarkable  IMPRESSION: 1. Pharyngeal mucosal edema within the left hypopharynx consistent with pharyngitis/ tonsillitis. Mucosal edema and extends the piriform sinus. No mucosal lesion identified. No evidence of abscess. Recommend appropriate therapy and if persistent symptoms recommend reimaging or direct visualization. 2. No airway compromise   Electronically Signed   By: SSuzy BouchardM.D.   On: 04/06/2014 09:45    Microbiology: Recent Results (from the past 240 hour(s))  MRSA PCR Screening     Status: None   Collection Time: 04/06/14 10:38 AM  Result Value Ref Range Status   MRSA by PCR NEGATIVE NEGATIVE Final     Labs: Basic Metabolic Panel:  Recent Labs Lab 04/06/14 0748 04/07/14 0350  NA 138 136  K 4.0 4.3  CL 102 102  CO2 26 25  GLUCOSE 174* 123*  BUN 16 10  CREATININE 0.96  0.85  CALCIUM 8.7 8.2*   Liver Function Tests:  Recent Labs Lab 04/07/14 0350  AST 20  ALT 20  ALKPHOS 63  BILITOT 1.1  PROT 6.7  ALBUMIN 3.3*   No  results for input(s): LIPASE, AMYLASE in the last 168 hours. No results for input(s): AMMONIA in the last 168 hours. CBC:  Recent Labs Lab 04/06/14 0748 04/07/14 0350  WBC 19.2* 11.6*  NEUTROABS 12.8*  --   HGB 14.8 13.8  HCT 45.2 43.8  MCV 88.1 88.1  PLT 344 318   Cardiac Enzymes:  Recent Labs Lab 04/06/14 2258 04/07/14 0350  TROPONINI <0.03 <0.03   BNP: BNP (last 3 results) No results for input(s): BNP in the last 8760 hours.  ProBNP (last 3 results)  Recent Labs  06/06/13 1155  PROBNP 39.9    CBG: No results for input(s): GLUCAP in the last 168 hours.  Time coordinating discharge: Over 30 minutes

## 2014-04-07 NOTE — Care Management Note (Signed)
CARE MANAGEMENT NOTE 04/07/2014  Patient:  [name], [name]   Account Number:  [id number]  Date Initiated:  04/07/2014  Documentation initiated by:  DAVIS,RHONDA  Subjective/Objective Assessment:   confusion, sepsis     Action/Plan:   home when stable   Anticipated DC Date:  04/10/2014   Anticipated DC Plan:  HOME/SELF CARE  In-house referral  NA      DC Planning Services  CM consult      PAC Choice  NA   Choice offered to / List presented to:  NA           Status of service:  In process, will continue to follow Medicare Important Message given?   (If response is "NO", the following Medicare IM given date fields will be blank) Date Medicare IM given:   Medicare IM given by:   Date Additional Medicare IM given:   Additional Medicare IM given by:    Discharge Disposition:    Per UR Regulation:  Reviewed for med. necessity/level of care/duration of stay  If discussed at [place] of Stay Meetings, dates discussed:    Comments:  April 07, 2014/Rhonda L. Rosana Hoes, RN, BSN, CCM. Case Management [place] [phone] No discharge needs present of time of review.

## 2014-04-07 NOTE — Progress Notes (Signed)
Patient [place] home.  All instructions given prior to discharge including prescriptions for Pepcid and Clindamycin.  Pt refused offer to provide w/c ride downstairs and walked out the door by self.

## 2014-04-07 NOTE — Progress Notes (Signed)
Pt off monitor at 0456. Upon entering the room, pt noted to have climbed over the bed rail and had disconnected herself from all equipment and standing in the middle of the room. Pt stated she needed to use the restroom. RN allowed pt to walk to the bathroom but was reminded to please ring the call bell for assistance when she needs to get up for her safety. Pt agreed. Pt reconnected to monitors/IV fluids. Bed alarm reactivated.

## 2014-04-08 ENCOUNTER — Telehealth: Payer: Self-pay | Admitting: Cardiovascular Disease

## 2014-04-08 NOTE — Telephone Encounter (Signed)
Follow up       Pt called to leave a new number

## 2014-04-08 NOTE — Telephone Encounter (Signed)
I spoke with the pt and she wanted to make our office aware that she took a NTG during her hospitalization due to "really bad chest pain".  The pt states that she took a NTG from her home supply due to the hospital staff not doing anything to help her pain and taking to long.  I advised the pt that per hospital policy the pt cannot take home medications and that the nurse has to have an order in place before giving medications.  I reviewed the pt's hospital chart and documentation was completed in regards to this incidence.  The pt just underwent cardiac catheterization in February and I reviewed these findings with her again.  I made the pt aware that if she has further symptoms, questions or concerns then she should contact our office.

## 2014-04-08 NOTE — Telephone Encounter (Signed)
New problem   Pt stated was in the hospital and need to talk to you concerning using nitroglycerin. Please call pt.

## 2014-04-09 ENCOUNTER — Telehealth: Payer: Self-pay | Admitting: *Deleted

## 2014-04-09 NOTE — Telephone Encounter (Signed)
[name] - Client [facility] Patient Name: [name] Gender: Female DOB: 02/09/1945 Age: 69 Y 61 M 15 D Return Phone Number: [phone] (Primary) Address: City/State/Zip: [place] [address] Client [facility] - Client Client Site [place] - Day Physician John, [place] Type Call Call Type Triage / Clinical Relationship To Patient Self Appointment Disposition EMR Appointment Not Necessary Info pasted into Epic Yes Return Phone Number [phone] (Primary) Chief Complaint Facial Swelling Initial Comment Caller states her lips are swollen she is on prednisone PreDisposition Call Doctor Nurse Assessment Guidelines Guideline Title Affirmed Question Affirmed Notes Nurse Date/Time Eilene Ghazi Time) Face Swelling Mild facial swelling of unknown cause (all triage questions negative) Donalynn Furlong, RN, Myna Hidalgo 04/03/2014 2:56:11 PM Disp. Time Eilene Ghazi Time) Disposition Final User 04/03/2014 3:00:33 PM Home Care Yes Donalynn Furlong, RN, Myna Hidalgo Caller Understands: Yes Disagree/Comply: Comply Care Advice Given Per Guideline HOME CARE: You should be able to treat this at home. REASSURANCE: * Mild face swelling or puffiness can be caused by a mild allergic reaction. For example people can have a reaction to pollin, something they have eaten, a chemical, or some other allergic substance. * Here is some care advice that may help. REMOVE ALLERGENS: Take a shower to remove pollens, animal dander or other allergic substances from the body and hair. LOCAL COLD: Apply a cool, wet washcloth to the face for 20 minutes. ANTIHISTAMINE (E.G., BENADRYL): * Take an antihistamine by mouth to reduce the swelling and to help with any itching. * Benadryl (diphenhydramine) every 6 hours is best. Adult dose is 25-50 mg. Take 2 or 3 times. * If Benadryl is not available, use any hay fever or cold medicine that  contains an antihistamine. CAUTION - ANTIHISTAMINES: * Examples include diphenhydramine (Benadryl) and chlorpheniramine (Chlortrimeton, Chlor-tripolon) * May cause sleepiness. Do not drink alcohol, drive or operate dangerous machinery while taking antihistamines. AVOIDANCE: Avoid any allergic foods, animals or settings (e.g., barns) that are associated with the face swelling. CALL BACK IF: * Swelling persists over 3 days * Swelling becomes red or painful to the touch * You become worse. CARE ADVICE given per Face Swelling (Adult) guideline. PLEASE NOTE: All timestamps contained within this report are represented as [place] Standard Time. CONFIDENTIALTY NOTICE: This fax transmission is intended only for the addressee. It contains information that is legally privileged, confidential or otherwise protected from use or disclosure. If you are not the intended recipient, you are strictly prohibited from reviewing, disclosing, copying using or disseminating any of this information or taking any action in reliance on or regarding this information. If you have received this fax in error, please notify us immediately by telephone so that we can arrange for its return to [place]. Phone: [phone], Toll-Free: [phone], Fax: [phone] Page: 2 of 2 Call Id: [id number] After Care Instructions Given Call Event Type User Date / Time Description Comments User: Gennie Alma, RN Date/Time Eilene Ghazi Time): 04/03/2014 3:03:21 PM Advised pt to call back to the office and request guidance from Dr John/Dr Gwynn Burly staff, regarding their advice on what to do with reference to the last 3 pills of the Prednisone taper that was originally ordered. ( Instructed pt on the importance of this given the usual need for continuity with a Prednisone Taper)

## 2014-04-09 NOTE — Telephone Encounter (Signed)
Transition Care Management Follow-up Telephone Call D/C 04/07/14  How have you been since you were released from the hospital? Pt states she is better   Do you understand why you were in the hospital? YES   Do you understand the discharge instrcutions? YES  Items Reviewed:  Medications reviewed: YES  Allergies reviewed: YES  Dietary changes reviewed: NO  Referrals reviewed: No referral needed   Functional Questionnaire:   Activities of Daily Living (ADLs):   She states she are independent in the following: ambulation, bathing and hygiene, feeding, continence, grooming, toileting and dressing States she doesn't require assistance    Any transportation issues/concerns?: NO   Any patient concerns? NO   Confirmed importance and date/time of follow-up visits scheduled: YES, pt has already call and made appt for 04/14/14 advise pt to keep appt}   Confirmed with patient if condition begins to worsen call PCP or go to the ER.

## 2014-04-10 ENCOUNTER — Ambulatory Visit (INDEPENDENT_AMBULATORY_CARE_PROVIDER_SITE_OTHER): Payer: Medicare Other | Admitting: Internal Medicine

## 2014-04-10 ENCOUNTER — Encounter: Payer: Self-pay | Admitting: Internal Medicine

## 2014-04-10 VITALS — BP 108/60 | HR 101 | Temp 98.4°F | Wt 223.5 lb

## 2014-04-10 DIAGNOSIS — Z9114 Patient's other noncompliance with medication regimen: Secondary | ICD-10-CM

## 2014-04-10 DIAGNOSIS — J0510 Acute epiglottitis without obstruction: Secondary | ICD-10-CM | POA: Diagnosis not present

## 2014-04-10 DIAGNOSIS — I2510 Atherosclerotic heart disease of native coronary artery without angina pectoris: Secondary | ICD-10-CM

## 2014-04-10 DIAGNOSIS — E11311 Type 2 diabetes mellitus with unspecified diabetic retinopathy with macular edema: Secondary | ICD-10-CM

## 2014-04-10 DIAGNOSIS — I10 Essential (primary) hypertension: Secondary | ICD-10-CM | POA: Diagnosis not present

## 2014-04-10 MED ORDER — CLINDAMYCIN HCL 300 MG PO CAPS: 300.0000 mg | ORAL_CAPSULE | Freq: Three times a day (TID) | ORAL | Status: DC

## 2014-04-10 NOTE — Assessment & Plan Note (Signed)
Ok to take the cleocin, I sent erx new rx with corrected quantity, lots of reassurance regarding her dx and plan for tx

## 2014-04-10 NOTE — Assessment & Plan Note (Signed)
Urged compliance with meds, or at least call if she has further concerns or questions

## 2014-04-10 NOTE — Assessment & Plan Note (Signed)
stable overall by history and exam, recent data reviewed with pt, and pt to continue medical treatment as before,  to f/u any worsening symptoms or concerns Lab Results  Component Value Date   HGBA1C 7.0* 08/22/2013

## 2014-04-10 NOTE — Progress Notes (Signed)
Pre visit review using our clinic review tool, if applicable. No additional management support is needed unless otherwise documented below in the visit note. 

## 2014-04-10 NOTE — Addendum Note (Signed)
Addended by: Biagio Borg on: 04/10/2014 01:59 PM   Modules accepted: Level of Service

## 2014-04-10 NOTE — Progress Notes (Signed)
Subjective:    Patient ID: [name], female    DOB: [birth year], 69 y.o.   MRN: [id number]  HPI   Here to f/u recent hospn with epiglottitis/sepsis with d/c at 24 hrs, now here 3 days later, unfortuanately did not take the rec'd cleocin as she was confused about what her illness is all about, and told staff she was here to f/u UTI (no pyuria on UA mar 20, no urine cx done).  Denies urinary symptoms such as dysuria, frequency, urgency, flank pain, hematuria or n/v, fever, chills. Does still have ST, mild swelling sensation not clearly getting worse, low grade temp and diffuse achiness.   Past Medical History  Diagnosis Date  . Coronary artery disease     a. s/p inferior MI 1998->PCI of RCA. b. S/p prior PCI of the OM1 and LAD;  c. 05/2013 Cath/PCI: LM nl, LAD 90p (3.0x22 Resolute DES), 40m w/ patent stent, Diag 40p, 39m, LCX patent OM1 stent, RCA 50p, 23m, EF 65%.  . Hyperlipidemia   . Hypertension   . Obesity, unspecified   . Hypothyroidism   . Diabetes mellitus     TYPE II  . Anxiety state, unspecified   . Depressive disorder, not elsewhere classified   . Degeneration of lumbar or lumbosacral intervertebral disc   . Allergic rhinitis, cause unspecified   . Lower GI bleed 06/2010    Diverticular bleed  . Chronic LBP   . AAA (abdominal aortic aneurysm)     a. 4.7cm in 08/2012. Followed by [facility].  . Gout 08/22/2013  . Noncompliance with medications 02/26/2014   Past Surgical History  Procedure Laterality Date  . Total abdominal hysterectomy    . Lumbar fusion  01/2007    DR. Patrice Paradise...3-LEVEL WITH FIXATION  . Parathyroidectomy    . Thyroidectomy    . Cardiac catheterization      PCI OF BOTH THE CIRCUMFLEX AND LEFT ANTERIOR DESCENDING ARTERY  . Cesarean section    . Heart stent  04-2010  and  Jun 07, 2013    X 3  . Spine surgery    . Left heart catheterization with coronary angiogram N/A 06/07/2013    Procedure: LEFT HEART CATHETERIZATION WITH CORONARY ANGIOGRAM;  Surgeon: [name], MD;  Location: [facility] CATH LAB;  Service: Cardiovascular;  Laterality: N/A;  . Left heart catheterization with coronary angiogram N/A 02/25/2014    Procedure: LEFT HEART CATHETERIZATION WITH CORONARY ANGIOGRAM;  Surgeon: Troy Sine, MD;  Location: [facility] CATH LAB;  Service: Cardiovascular;  Laterality: N/A;    reports that she quit smoking about 27 years ago. Her smoking use included Cigarettes. She quit after 30 years of use. She has never used smokeless tobacco. She reports that she does not drink alcohol or use illicit drugs. family history includes Arthritis in her maternal aunt; Cancer in her maternal aunt; Diabetes in her brother and maternal aunt; Heart attack (age of onset: 77) in her mother; Heart attack (age of onset: 80) in her father; Heart disease in her father and mother. There is no history of Colon cancer. Allergies  Allergen Reactions  . Prilosec [Omeprazole] Other (See Comments)    Chest pain  . Miconazole Nitrate Hives    REACTION: hives  . Prednisone Swelling    Tongue/lip swelling.  . Augmentin [Amoxicillin-Pot Clavulanate] Hives, Itching and Rash  . Doxycycline Other (See Comments)    REACTION: gi upset   Current Outpatient Prescriptions on File Prior to Visit  Medication Sig Dispense  Refill  . acetaminophen (TYLENOL) 325 MG tablet Take 2 tablets (650 mg total) by mouth every 4 (four) hours as needed for headache or mild pain.    [name] allopurinol (ZYLOPRIM) 300 MG tablet Take 1 tablet (300 mg total) by mouth daily. 90 tablet 3  . aspirin EC 81 MG tablet Take 1 tablet (81 mg total) by mouth daily. For 1 month.  Then resume taking 4 times a week (tue thur sat sun) (Patient taking differently: Take 81 mg by mouth as directed. 4 times a week (tue thur sat sun))    . atenolol (TENORMIN) 25 MG tablet Take 1 tablet (25 mg total) by mouth 2 (two) times daily. 180 tablet 3  . cetirizine (ZYRTEC) 10 MG chewable tablet Chew 10 mg by mouth daily as needed for allergies.    .  famotidine (PEPCID) 20 MG tablet Take 1 tablet (20 mg total) by mouth 2 (two) times daily. 60 tablet 0  . gabapentin (NEURONTIN) 300 MG capsule Take 300 mg by mouth 3 (three) times daily as needed (Patient takes 300mg  by mouth three times daily as needed for nerve pain).    [name] HYDROcodone-homatropine (HYCODAN) 5-1.5 MG/5ML syrup Take 5 mLs by mouth every 6 (six) hours as needed for cough. 180 mL 0  . hydrOXYzine (ATARAX/VISTARIL) 10 MG tablet TAKE 1 TABLET BY MOUTH THREE TIMES DAILY AS NEEDED FOR ITCHING 90 tablet 0  . meloxicam (MOBIC) 7.5 MG tablet Take 1 tablet (7.5 mg total) by mouth daily. (Patient taking differently: Take 7.5 mg by mouth daily as needed for pain. ) 7 tablet 0  . metFORMIN (GLUCOPHAGE-XR) 500 MG 24 hr tablet Take 4 tablets (2,000 mg total) by mouth daily with breakfast.    . nitroGLYCERIN (NITROSTAT) 0.4 MG SL tablet Place 1 tablet (0.4 mg total) under the tongue every 5 (five) minutes as needed. 25 tablet 1  . oxyCODONE (OXY IR/ROXICODONE) 5 MG immediate release tablet 1-2 tabs every 6 hours as needed for pain, with limit 4 tabs per day - 30 tablet 0  . SYNTHROID 150 MCG tablet TAKE 1 TABLET BY MOUTH EVERY DAY 90 tablet 3  . tiZANidine (ZANAFLEX) 4 MG tablet Take 1 tablet (4 mg total) by mouth every 6 (six) hours as needed for muscle spasms. 40 tablet 1  . traMADol (ULTRAM) 50 MG tablet Take 1 tablet (50 mg total) by mouth every 6 (six) hours as needed for pain. 60 tablet 1   No current facility-administered medications on file prior to visit.   Review of Systems  Constitutional: Negative for unusual diaphoresis or night sweats HENT: Negative for ringing in ear or discharge Eyes: Negative for double vision or worsening visual disturbance.  Respiratory: Negative for choking and stridor.   Gastrointestinal: Negative for vomiting or other signifcant bowel change Genitourinary: Negative for hematuria or change in urine volume.  Musculoskeletal: Negative for other MSK pain or  swelling Skin: Negative for color change and worsening wound.  Neurological: Negative for tremors and numbness other than noted  Psychiatric/Behavioral: Negative for decreased concentration or agitation other than above       Objective:   Physical Exam BP 108/60 mmHg  Pulse 101  Temp(Src) 98.4 F (36.9 C) (Oral)  Wt 223 lb 8 oz (101.379 kg)  SpO2 97% VS noted, mild ill appearing Constitutional: Pt appears in no significant distress HENT: Head: NCAT.  Right Ear: External ear normal.  Left Ear: External ear normal.  Eyes: . Pupils are equal, round, and reactive  to light. Conjunctivae and EOM are normal Bilat tm's with trace erythema.  Max sinus areas non tender.  Pharynx with mod tosevere erythema, no exudate Neck: Normal range of motion. Neck supple.  Cardiovascular: Normal rate and regular rhythm.   Pulmonary/Chest: Effort normal and breath sounds without rales or wheezing.  Abd:  Soft, NT, ND, + BS Neurological: Pt is alert. Not confused , motor grossly intact Skin: Skin is warm. No rash, no LE edema Psychiatric: Pt behavior is normal. No agitation.     Assessment & Plan:

## 2014-04-10 NOTE — Patient Instructions (Addendum)
OK to take the antibiotic at three times per day for 10 days  Please continue all other medications as before, and refills have been done if requested.  Please have the pharmacy call with any other refills you may need.  Please keep your appointments with your specialists as you may have planned  Please return in 6 months, or sooner if needed

## 2014-04-10 NOTE — Assessment & Plan Note (Signed)
stable overall by history and exam, recent data reviewed with pt, and pt to continue medical treatment as before,  to f/u any worsening symptoms or concerns BP Readings from Last 3 Encounters:  04/10/14 108/60  04/02/14 92/62  03/26/14 112/72

## 2014-04-13 LAB — CULTURE, BLOOD (ROUTINE X 2)
CULTURE: NO GROWTH
Culture: NO GROWTH

## 2014-05-05 ENCOUNTER — Encounter: Payer: Self-pay | Admitting: Internal Medicine

## 2014-05-05 ENCOUNTER — Other Ambulatory Visit (HOSPITAL_COMMUNITY): Payer: Self-pay

## 2014-05-05 ENCOUNTER — Observation Stay (HOSPITAL_COMMUNITY)
Admission: EM | Admit: 2014-05-05 | Discharge: 2014-05-07 | Disposition: A | Discharge status: Home / Self Care | Payer: Medicare Other | Attending: Internal Medicine | Admitting: Internal Medicine

## 2014-05-05 ENCOUNTER — Emergency Department (HOSPITAL_COMMUNITY): Payer: Medicare Other

## 2014-05-05 ENCOUNTER — Ambulatory Visit (INDEPENDENT_AMBULATORY_CARE_PROVIDER_SITE_OTHER): Payer: Medicare Other | Admitting: Internal Medicine

## 2014-05-05 ENCOUNTER — Encounter (HOSPITAL_COMMUNITY): Payer: Self-pay | Admitting: *Deleted

## 2014-05-05 VITALS — BP 110/68 | HR 85 | Temp 98.5°F | Ht 66.0 in | Wt 224.8 lb

## 2014-05-05 DIAGNOSIS — R220 Localized swelling, mass and lump, head: Secondary | ICD-10-CM | POA: Diagnosis not present

## 2014-05-05 DIAGNOSIS — I159 Secondary hypertension, unspecified: Secondary | ICD-10-CM

## 2014-05-05 DIAGNOSIS — I714 Abdominal aortic aneurysm, without rupture: Secondary | ICD-10-CM | POA: Diagnosis not present

## 2014-05-05 DIAGNOSIS — M109 Gout, unspecified: Secondary | ICD-10-CM

## 2014-05-05 DIAGNOSIS — Y9389 Activity, other specified: Secondary | ICD-10-CM | POA: Diagnosis not present

## 2014-05-05 DIAGNOSIS — Z9114 Patient's other noncompliance with medication regimen: Secondary | ICD-10-CM

## 2014-05-05 DIAGNOSIS — Z8719 Personal history of other diseases of the digestive system: Secondary | ICD-10-CM | POA: Insufficient documentation

## 2014-05-05 DIAGNOSIS — X58XXXA Exposure to other specified factors, initial encounter: Secondary | ICD-10-CM | POA: Insufficient documentation

## 2014-05-05 DIAGNOSIS — R079 Chest pain, unspecified: Secondary | ICD-10-CM | POA: Diagnosis not present

## 2014-05-05 DIAGNOSIS — R1310 Dysphagia, unspecified: Secondary | ICD-10-CM | POA: Diagnosis not present

## 2014-05-05 DIAGNOSIS — I252 Old myocardial infarction: Secondary | ICD-10-CM | POA: Insufficient documentation

## 2014-05-05 DIAGNOSIS — T783XXA Angioneurotic edema, initial encounter: Principal | ICD-10-CM | POA: Diagnosis present

## 2014-05-05 DIAGNOSIS — F329 Major depressive disorder, single episode, unspecified: Secondary | ICD-10-CM | POA: Diagnosis not present

## 2014-05-05 DIAGNOSIS — Y9289 Other specified places as the place of occurrence of the external cause: Secondary | ICD-10-CM | POA: Diagnosis not present

## 2014-05-05 DIAGNOSIS — E669 Obesity, unspecified: Secondary | ICD-10-CM | POA: Diagnosis not present

## 2014-05-05 DIAGNOSIS — I25119 Atherosclerotic heart disease of native coronary artery with unspecified angina pectoris: Secondary | ICD-10-CM | POA: Diagnosis not present

## 2014-05-05 DIAGNOSIS — Z87891 Personal history of nicotine dependence: Secondary | ICD-10-CM | POA: Diagnosis not present

## 2014-05-05 DIAGNOSIS — E1122 Type 2 diabetes mellitus with diabetic chronic kidney disease: Secondary | ICD-10-CM | POA: Diagnosis not present

## 2014-05-05 DIAGNOSIS — I2510 Atherosclerotic heart disease of native coronary artery without angina pectoris: Secondary | ICD-10-CM | POA: Diagnosis not present

## 2014-05-05 DIAGNOSIS — F419 Anxiety disorder, unspecified: Secondary | ICD-10-CM | POA: Insufficient documentation

## 2014-05-05 DIAGNOSIS — T7840XA Allergy, unspecified, initial encounter: Secondary | ICD-10-CM | POA: Diagnosis not present

## 2014-05-05 DIAGNOSIS — E785 Hyperlipidemia, unspecified: Secondary | ICD-10-CM | POA: Diagnosis not present

## 2014-05-05 DIAGNOSIS — Y998 Other external cause status: Secondary | ICD-10-CM | POA: Insufficient documentation

## 2014-05-05 DIAGNOSIS — E039 Hypothyroidism, unspecified: Secondary | ICD-10-CM | POA: Diagnosis not present

## 2014-05-05 DIAGNOSIS — Z9119 Patient's noncompliance with other medical treatment and regimen: Secondary | ICD-10-CM | POA: Diagnosis not present

## 2014-05-05 DIAGNOSIS — E119 Type 2 diabetes mellitus without complications: Secondary | ICD-10-CM

## 2014-05-05 DIAGNOSIS — G8929 Other chronic pain: Secondary | ICD-10-CM | POA: Diagnosis not present

## 2014-05-05 DIAGNOSIS — Z79899 Other long term (current) drug therapy: Secondary | ICD-10-CM | POA: Diagnosis not present

## 2014-05-05 DIAGNOSIS — M5137 Other intervertebral disc degeneration, lumbosacral region: Secondary | ICD-10-CM | POA: Insufficient documentation

## 2014-05-05 DIAGNOSIS — J392 Other diseases of pharynx: Secondary | ICD-10-CM

## 2014-05-05 DIAGNOSIS — Z91148 Patient's other noncompliance with medication regimen for other reason: Secondary | ICD-10-CM

## 2014-05-05 DIAGNOSIS — M10071 Idiopathic gout, right ankle and foot: Secondary | ICD-10-CM | POA: Diagnosis not present

## 2014-05-05 DIAGNOSIS — I10 Essential (primary) hypertension: Secondary | ICD-10-CM | POA: Diagnosis present

## 2014-05-05 DIAGNOSIS — E782 Mixed hyperlipidemia: Secondary | ICD-10-CM | POA: Diagnosis present

## 2014-05-05 DIAGNOSIS — J0510 Acute epiglottitis without obstruction: Secondary | ICD-10-CM | POA: Diagnosis present

## 2014-05-05 DIAGNOSIS — N189 Chronic kidney disease, unspecified: Secondary | ICD-10-CM

## 2014-05-05 DIAGNOSIS — R0602 Shortness of breath: Secondary | ICD-10-CM | POA: Diagnosis not present

## 2014-05-05 LAB — CBC WITH DIFFERENTIAL/PLATELET
BASOS ABS: 0 10*3/uL (ref 0.0–0.1)
Basophils Relative: 0 % (ref 0–1)
Eosinophils Absolute: 0.2 10*3/uL (ref 0.0–0.7)
Eosinophils Relative: 2 % (ref 0–5)
HCT: 41.9 % (ref 36.0–46.0)
HEMOGLOBIN: 13.6 g/dL (ref 12.0–15.0)
Lymphocytes Relative: 28 % (ref 12–46)
Lymphs Abs: 2.9 10*3/uL (ref 0.7–4.0)
MCH: 28.5 pg (ref 26.0–34.0)
MCHC: 32.5 g/dL (ref 30.0–36.0)
MCV: 87.7 fL (ref 78.0–100.0)
MONO ABS: 0.5 10*3/uL (ref 0.1–1.0)
MONOS PCT: 5 % (ref 3–12)
NEUTROS ABS: 6.6 10*3/uL (ref 1.7–7.7)
Neutrophils Relative %: 65 % (ref 43–77)
Platelets: 274 10*3/uL (ref 150–400)
RBC: 4.78 MIL/uL (ref 3.87–5.11)
RDW: 14.2 % (ref 11.5–15.5)
WBC: 10.1 10*3/uL (ref 4.0–10.5)

## 2014-05-05 LAB — BASIC METABOLIC PANEL
Anion gap: 12 (ref 5–15)
BUN: 7 mg/dL (ref 6–23)
CALCIUM: 8.5 mg/dL (ref 8.4–10.5)
CHLORIDE: 105 mmol/L (ref 96–112)
CO2: 22 mmol/L (ref 19–32)
CREATININE: 0.82 mg/dL (ref 0.50–1.10)
GFR calc non Af Amer: 72 mL/min — ABNORMAL LOW (ref >90)
GFR, EST AFRICAN AMERICAN: 83 mL/min — AB (ref >90)
Glucose, Bld: 108 mg/dL — ABNORMAL HIGH (ref 70–99)
Potassium: 3.7 mmol/L (ref 3.5–5.1)
Sodium: 139 mmol/L (ref 135–145)

## 2014-05-05 LAB — GLUCOSE, CAPILLARY: Glucose-Capillary: 316 mg/dL — ABNORMAL HIGH (ref 70–99)

## 2014-05-05 LAB — TSH: TSH: 2.618 u[IU]/mL (ref 0.350–4.500)

## 2014-05-05 LAB — I-STAT TROPONIN, ED: TROPONIN I, POC: 0 ng/mL (ref 0.00–0.08)

## 2014-05-05 LAB — TROPONIN I: Troponin I: <0.03 ng/mL (ref <0.031)

## 2014-05-05 LAB — RAPID STREP SCREEN (MED CTR MEBANE ONLY): STREPTOCOCCUS, GROUP A SCREEN (DIRECT): NEGATIVE

## 2014-05-05 MED ORDER — INSULIN ASPART 100 UNIT/ML ~~LOC~~ SOLN
0.0000 [IU] | SUBCUTANEOUS | Status: DC
Start: 2014-05-05 — End: 2014-05-07
  Administered 2014-05-05: 11 [IU] via SUBCUTANEOUS
  Administered 2014-05-06: 8 [IU] via SUBCUTANEOUS
  Administered 2014-05-06: 5 [IU] via SUBCUTANEOUS
  Administered 2014-05-06: 8 [IU] via SUBCUTANEOUS
  Administered 2014-05-06 (×2): 3 [IU] via SUBCUTANEOUS
  Administered 2014-05-06: 5 [IU] via SUBCUTANEOUS
  Administered 2014-05-07: 3 [IU] via SUBCUTANEOUS
  Administered 2014-05-07: 2 [IU] via SUBCUTANEOUS
  Administered 2014-05-07: 8 [IU] via SUBCUTANEOUS

## 2014-05-05 MED ORDER — ASPIRIN 81 MG PO CHEW
324.0000 mg | CHEWABLE_TABLET | Freq: Once | ORAL | Status: AC
  Administered 2014-05-05: 324 mg via ORAL
  Filled 2014-05-05: qty 4

## 2014-05-05 MED ORDER — EPINEPHRINE 0.3 MG/0.3ML IJ SOAJ
0.3000 mg | Freq: Once | INTRAMUSCULAR | Status: AC
  Administered 2014-05-05: 0.3 mg via INTRAMUSCULAR
  Filled 2014-05-05: qty 0.3

## 2014-05-05 MED ORDER — METHYLPREDNISOLONE SODIUM SUCC 125 MG IJ SOLR
125.0000 mg | Freq: Four times a day (QID) | INTRAMUSCULAR | Status: DC
  Administered 2014-05-05 – 2014-05-06 (×2): 125 mg via INTRAVENOUS
  Filled 2014-05-05 (×3): qty 2

## 2014-05-05 MED ORDER — SODIUM CHLORIDE 0.9 % IV SOLN
INTRAVENOUS | Status: AC
  Administered 2014-05-05: 17:00:00 via INTRAVENOUS

## 2014-05-05 MED ORDER — ATENOLOL 25 MG PO TABS
25.0000 mg | ORAL_TABLET | Freq: Two times a day (BID) | ORAL | Status: DC
  Administered 2014-05-05 – 2014-05-07 (×4): 25 mg via ORAL
  Filled 2014-05-05 (×4): qty 1

## 2014-05-05 MED ORDER — DIPHENHYDRAMINE HCL 50 MG/ML IJ SOLN
25.0000 mg | Freq: Four times a day (QID) | INTRAMUSCULAR | Status: DC | PRN
  Administered 2014-05-05 – 2014-05-06 (×2): 25 mg via INTRAVENOUS
  Filled 2014-05-05 (×2): qty 1

## 2014-05-05 MED ORDER — SODIUM CHLORIDE 0.9 % IJ SOLN
3.0000 mL | Freq: Two times a day (BID) | INTRAMUSCULAR | Status: DC
  Administered 2014-05-06 – 2014-05-07 (×3): 3 mL via INTRAVENOUS

## 2014-05-05 MED ORDER — MORPHINE SULFATE 4 MG/ML IJ SOLN
4.0000 mg | Freq: Once | INTRAMUSCULAR | Status: AC
  Administered 2014-05-05: 4 mg via INTRAVENOUS
  Filled 2014-05-05: qty 1

## 2014-05-05 MED ORDER — ZOLPIDEM TARTRATE 5 MG PO TABS
5.0000 mg | ORAL_TABLET | Freq: Every evening | ORAL | Status: DC | PRN
  Administered 2014-05-05 – 2014-05-06 (×2): 5 mg via ORAL
  Filled 2014-05-05 (×2): qty 1

## 2014-05-05 MED ORDER — LEVOTHYROXINE SODIUM 75 MCG PO TABS
150.0000 ug | ORAL_TABLET | Freq: Every day | ORAL | Status: DC
  Administered 2014-05-06 – 2014-05-07 (×2): 150 ug via ORAL
  Filled 2014-05-05 (×3): qty 2

## 2014-05-05 MED ORDER — HYDROCODONE-HOMATROPINE 5-1.5 MG/5ML PO SYRP: 5.0000 mL | ORAL_SOLUTION | Freq: Four times a day (QID) | ORAL | Status: DC | PRN

## 2014-05-05 MED ORDER — HEPARIN SODIUM (PORCINE) 5000 UNIT/ML IJ SOLN
5000.0000 [IU] | Freq: Three times a day (TID) | INTRAMUSCULAR | Status: DC
  Administered 2014-05-05 – 2014-05-07 (×4): 5000 [IU] via SUBCUTANEOUS
  Filled 2014-05-05 (×4): qty 1

## 2014-05-05 MED ORDER — METHYLPREDNISOLONE SODIUM SUCC 125 MG IJ SOLR
125.0000 mg | Freq: Once | INTRAMUSCULAR | Status: AC
  Administered 2014-05-05: 125 mg via INTRAVENOUS
  Filled 2014-05-05: qty 2

## 2014-05-05 MED ORDER — FAMOTIDINE 20 MG PO TABS
20.0000 mg | ORAL_TABLET | Freq: Two times a day (BID) | ORAL | Status: DC
  Administered 2014-05-05 – 2014-05-07 (×4): 20 mg via ORAL
  Filled 2014-05-05 (×4): qty 1

## 2014-05-05 MED ORDER — MORPHINE SULFATE 2 MG/ML IJ SOLN
1.0000 mg | Freq: Once | INTRAMUSCULAR | Status: AC
Start: 2014-05-05 — End: 2014-05-05
  Administered 2014-05-05: 1 mg via INTRAVENOUS
  Filled 2014-05-05: qty 1

## 2014-05-05 MED ORDER — METHYLPREDNISOLONE SODIUM SUCC 125 MG IJ SOLR: 125.0000 mg | Freq: Four times a day (QID) | INTRAMUSCULAR | Status: DC

## 2014-05-05 MED ORDER — DIPHENHYDRAMINE HCL 50 MG/ML IJ SOLN
25.0000 mg | Freq: Once | INTRAMUSCULAR | Status: AC
  Administered 2014-05-05: 25 mg via INTRAVENOUS
  Filled 2014-05-05: qty 1

## 2014-05-05 MED ORDER — IOHEXOL 300 MG/ML  SOLN
75.0000 mL | Freq: Once | INTRAMUSCULAR | Status: AC | PRN
  Administered 2014-05-05: 75 mL via INTRAVENOUS

## 2014-05-05 MED ORDER — GABAPENTIN 300 MG PO CAPS
300.0000 mg | ORAL_CAPSULE | Freq: Once | ORAL | Status: AC
  Administered 2014-05-05: 300 mg via ORAL
  Filled 2014-05-05: qty 1

## 2014-05-05 NOTE — Progress Notes (Signed)
Pre visit review using our clinic review tool, if applicable. No additional management support is needed unless otherwise documented below in the visit note. 

## 2014-05-05 NOTE — ED Notes (Addendum)
Sent here from dr's office with angioedema- pt has swelling under chin and tongue swollen started yesterday- no meds given per dr's office and EMS, recent dental work-- cleaned under gums.   Also c/o right ankle lateral mallaleous reddened and painful.

## 2014-05-05 NOTE — Progress Notes (Signed)
Pt has Type II DM and on solumedrol.  CBG 316 - NP paged and notified.

## 2014-05-05 NOTE — H&P (Signed)
[facility] History and Physical  [name] YTK:[id number] DOB: 06-Jul-1945 DOA: 05/05/2014  Referring physician: Dr Reather Converse PCP: [name], MD  Specialists: None yet  Chief Complaint: Neck swelling  HPI: 69 y/o ?, CAd s/p PCI 1998, Rpt 05/2013--Last H cath 02/2014=Nl EF 55-60%--no intervention performed at the time, Dmty 2, HLd, Htn, hypothyroid. bipolar 1, DDD, L GIB 2012, AAA 08/2012, Gout, documented non-compliance, hyperthyroid status post thyroidectomy as well as parathyroidectomy about 45 years ago  recent admit 3/20-->3/21"throat swelling"-Rx Clindamycin [took?] Re-presented to urgent care with a multitude of complaints. She has had right lower extremity pain and discomfort with her gout for the past 1-2 days she's also felt swelling in her mouth/tongue for the past 24 hours similar to her recent episode of? Epiglottitis that she was admitted for Denies any nausea vomiting No new medications or outside food that were different States that "my tongue has been more swollen in is because by mouth" "My lower lip was swollen as well and it was huge."  She then subsequently complained to the emergency physician about chest pain going on since arriving in the emergency room. Nonradiating Finally she has had 4-5 episodes of diarrhea over the past 2-3 days-she went to the post office yesterday had an episode and then had another 2-3 episodes. She had no nausea vomiting and was able to tolerate a meal of chicken wings coleslaw as well as pork chops last night for dinner  Labs within normal limits CT scan neck slightly glands by soft tissue subtle stranding to post submandibular glands as well   Past Medical History  Diagnosis Date  . Coronary artery disease     a. s/p inferior MI 1998->PCI of RCA. b. S/p prior PCI of the OM1 and LAD;  c. 05/2013 Cath/PCI: LM nl, LAD 90p (3.0x22 Resolute DES), 87m w/ patent stent, Diag 40p, 39m, LCX patent OM1 stent, RCA 50p, 56m, EF 65%.  .  Hyperlipidemia   . Hypertension   . Obesity, unspecified   . Hypothyroidism   . Diabetes mellitus     TYPE II  . Anxiety state, unspecified   . Depressive disorder, not elsewhere classified   . Degeneration of lumbar or lumbosacral intervertebral disc   . Allergic rhinitis, cause unspecified   . Lower GI bleed 06/2010    Diverticular bleed  . Chronic LBP   . AAA (abdominal aortic aneurysm)     a. 4.7cm in 08/2012. Followed by [facility].  . Gout 08/22/2013  . Noncompliance with medications 02/26/2014   Past Surgical History  Procedure Laterality Date  . Total abdominal hysterectomy    . Lumbar fusion  01/2007    DR. Patrice Paradise...3-LEVEL WITH FIXATION  . Parathyroidectomy    . Thyroidectomy    . Cardiac catheterization      PCI OF BOTH THE CIRCUMFLEX AND LEFT ANTERIOR DESCENDING ARTERY  . Cesarean section    . Heart stent  04-2010  and  Jun 07, 2013    X 3  . Spine surgery    . Left heart catheterization with coronary angiogram N/A 06/07/2013    Procedure: LEFT HEART CATHETERIZATION WITH CORONARY ANGIOGRAM;  Surgeon: [name], MD;  Location: [facility] CATH LAB;  Service: Cardiovascular;  Laterality: N/A;  . Left heart catheterization with coronary angiogram N/A 02/25/2014    Procedure: LEFT HEART CATHETERIZATION WITH CORONARY ANGIOGRAM;  Surgeon: Troy Sine, MD;  Location: [facility] CATH LAB;  Service: Cardiovascular;  Laterality: N/A;   Social History:  History  Social History Narrative   DIVORCED   3 CHILDREN   PATIENT SIGNED A DESIGNATED PARTY RELEASE TO ALLOW HER DAUGHTER, TRAMAINE Makki, TO HAVE ACCESS TO HER MEDICAL RECORDS/INFORMATION. [name], May 04, 2009 @ 3:27 PM   Smokes cigarettes on rare occasions.    Allergies  Allergen Reactions  . Prilosec [Omeprazole] Other (See Comments)    Chest pain  . Miconazole Nitrate Hives    REACTION: hives  . Prednisone Swelling    Tongue/lip swelling.  . Augmentin [Amoxicillin-Pot Clavulanate] Hives, Itching and Rash  .  Doxycycline Other (See Comments)    REACTION: gi upset    Family History  Problem Relation Age of Onset  . Heart attack Mother 2    s/p D&C-CARDIAC ARREST 1966  . Heart disease Mother   . Heart attack Father 47    1978 WITH MI  . Heart disease Father   . Colon cancer Neg Hx   . Diabetes Brother   . Diabetes Maternal Aunt   . Arthritis Maternal Aunt   . Cancer Maternal Aunt     Prior to Admission medications   Medication Sig Start Date End Date Taking? Authorizing Provider  acetaminophen (TYLENOL) 325 MG tablet Take 2 tablets (650 mg total) by mouth every 4 (four) hours as needed for headache or mild pain. 02/26/14   Isaiah Serge, NP  allopurinol (ZYLOPRIM) 300 MG tablet Take 1 tablet (300 mg total) by mouth daily. 01/14/14   Biagio Borg, MD  aspirin EC 81 MG tablet Take 1 tablet (81 mg total) by mouth daily. For 1 month.  Then resume taking 4 times a week (tue thur sat sun) Patient taking differently: Take 81 mg by mouth as directed. 4 times a week (tue thur sat sun) 06/25/13   Liliane Shi, PA-C  atenolol (TENORMIN) 25 MG tablet Take 1 tablet (25 mg total) by mouth 2 (two) times daily. 02/21/13   Biagio Borg, MD  cetirizine (ZYRTEC) 10 MG chewable tablet Chew 10 mg by mouth daily as needed for allergies.    Historical Provider, MD  clindamycin (CLEOCIN) 300 MG capsule Take 1 capsule (300 mg total) by mouth 3 (three) times daily. 04/10/14   Biagio Borg, MD  famotidine (PEPCID) 20 MG tablet Take 1 tablet (20 mg total) by mouth 2 (two) times daily. 04/07/14   Robbie Lis, MD  gabapentin (NEURONTIN) 300 MG capsule Take 300 mg by mouth 3 (three) times daily as needed (Patient takes 300mg  by mouth three times daily as needed for nerve pain).    Historical Provider, MD  HYDROcodone-homatropine (HYCODAN) 5-1.5 MG/5ML syrup Take 5 mLs by mouth every 6 (six) hours as needed for cough. 09/25/13   Biagio Borg, MD  hydrOXYzine (ATARAX/VISTARIL) 10 MG tablet TAKE 1 TABLET BY MOUTH THREE TIMES  DAILY AS NEEDED FOR ITCHING 04/08/14   Biagio Borg, MD  meloxicam (MOBIC) 7.5 MG tablet Take 1 tablet (7.5 mg total) by mouth daily. Patient taking differently: Take 7.5 mg by mouth daily as needed for pain.  08/03/13   Debbrah Alar, NP  metFORMIN (GLUCOPHAGE-XR) 500 MG 24 hr tablet Take 4 tablets (2,000 mg total) by mouth daily with breakfast. 02/28/14   Isaiah Serge, NP  nitroGLYCERIN (NITROSTAT) 0.4 MG SL tablet Place 1 tablet (0.4 mg total) under the tongue every 5 (five) minutes as needed. 08/16/13   Biagio Borg, MD  oxyCODONE (OXY IR/ROXICODONE) 5 MG immediate release tablet 1-2 tabs every 6  hours as needed for pain, with limit 4 tabs per day - 03/26/14   Biagio Borg, MD  SYNTHROID 150 MCG tablet TAKE 1 TABLET BY MOUTH EVERY DAY 02/10/14   Biagio Borg, MD  tiZANidine (ZANAFLEX) 4 MG tablet Take 1 tablet (4 mg total) by mouth every 6 (six) hours as needed for muscle spasms. 08/13/13   Biagio Borg, MD  traMADol (ULTRAM) 50 MG tablet Take 1 tablet (50 mg total) by mouth every 6 (six) hours as needed for pain. 02/27/12   Biagio Borg, MD   Physical Exam: Filed Vitals:   05/05/14 1515 05/05/14 1552 05/05/14 1600 05/05/14 1615  BP: 135/79 125/74  136/75  Pulse: 102  90 87  Resp: 21     SpO2: 97%  98% 97%    Pleasant circuitous African-American female in no apparent distress Lower lip slightly excoriated and dark colored, frenulum of the tongue appears swollen, can appreciate the back of her throat, no wheeze, Mallampati 3 but tonsils do not appear enlarged Cannot appreciate submandibular lymphadenopathy S1-S2 no murmur rub or gallop Abdomen soft nontender nondistended no rebound no guarding Cranial nerves intact No reproducible chest pain   Labs on Admission:  Basic Metabolic Panel:  Recent Labs Lab 05/05/14 1357  NA 139  K 3.7  CL 105  CO2 22  GLUCOSE 108*  BUN 7  CREATININE 0.82  CALCIUM 8.5   Liver Function Tests: No results for input(s): AST, ALT, ALKPHOS, BILITOT,  PROT, ALBUMIN in the last 168 hours. No results for input(s): LIPASE, AMYLASE in the last 168 hours. No results for input(s): AMMONIA in the last 168 hours. CBC:  Recent Labs Lab 05/05/14 1357  WBC 10.1  NEUTROABS 6.6  HGB 13.6  HCT 41.9  MCV 87.7  PLT 274   Cardiac Enzymes: No results for input(s): CKTOTAL, CKMB, CKMBINDEX, TROPONINI in the last 168 hours.  BNP (last 3 results) No results for input(s): BNP in the last 8760 hours.  ProBNP (last 3 results)  Recent Labs  06/06/13 1155  PROBNP 39.9    CBG: No results for input(s): GLUCAP in the last 168 hours.  Radiological Exams on Admission: Ct Soft Tissue Neck W Contrast  05/05/2014   CLINICAL DATA:  Facial swelling.  Unable to swallow.  EXAM: CT NECK WITH CONTRAST  TECHNIQUE: Multidetector CT imaging of the neck was performed using the standard protocol following the bolus administration of intravenous contrast.  CONTRAST:  65mL OMNIPAQUE IOHEXOL 300 MG/ML  SOLN  COMPARISON:  CT scan of the neck dated 04/16/2004 and MRI dated 01/03/2005 and CT dated 04/06/2014  FINDINGS: Pharynx and larynx: There is slight prominence of the soft tissues in the region of the left lingual tonsil but there is no abscess or definable mass. This narrows the airway at the level of the epiglottis.  Salivary glands: The parotid and submandibular glands are normal. There is very subtle soft tissue stranding lateral to both submandibular glands best seen on image 54 of series 7.  Thyroid: The patient has had a thyroidectomy. There is a 13 mm thyroid remnant to the right of midline.  Lymph nodes: No adenopathy.  Vascular: No significant abnormality. Tortuosity of the brachiocephalic vessels.  Limited intracranial: Normal.  Visualized orbits: Normal.  Mastoids and visualized paranasal sinuses: Normal.  Skeleton: Diffuse degenerative disc and joint disease in the cervical spine, unchanged since the prior study of 04/06/2014.  Upper chest: Normal.  IMPRESSION:  Very subtle soft tissue stranding in the  subcutaneous fat just lateral to both some submandibular glands. The glands themselves appear normal. This is nonspecific.  Improved slight soft tissue asymmetry in the left hypopharynx. This is felt represent slight enlargement of the left lingual tonsil.   Electronically Signed   By: Lorriane Shire M.D.   On: 05/05/2014 16:18    EKG: Independently reviewed. Sinus tachycardia, 90, PR interval 0.12, QRS axis -50, no ST-T wave depressions or changes  Assessment/Plan Throat swelling-unclear etiology. I'm not sure if this is an epiglottitis or not. Her symptoms have resolved with 1 dose of epinephrine as well as with IV Solu-Medrol. Patient will need supportive continued management with Solu-Medrol overnight at least and continued  Antihistamine use.   I suspect that these issues will resolve themselves however I'm not sure what the trigger is for the patient. I have discontinued most of medications that I feel may contribute tramadol Mobic Mtformin. Patient is afebrile and reports use of clindamycin 10 days subsequent to seeing Dr. [name] where it was documented she was noncompliant on this--> she promises that she completed the full course of clindamycin  Drs. Zavitz Of the emergency room did speak with Dr. Erik Obey of ENT at my request to review the images and films and stated apparently that patient seems stable and monitor clinically. At this time because of her resolution with chest steroids and supportive management I feel that that is reasonable and patient looks stable at present time to admit to telemetry with monitoring for now.   I do not feel strongly that this patient's chest pain is cardiogenic as her EKG is completely within normal limits point her care troponins negative and we can cycle troponins however I do not think cardiology's input is needed right now.  The rest of her medical issues are stable. She will be observed overnight and if  everything is better she can be placed on a slow taper of Medrol Dosepak as an outpatient. It was documented in the past that there may be an allergy to prednisone so we will not transition her to that on discharge.  For completion sake because she has had a issues with toxic goiter about 40 years ago, I will get a TSH as she is on thyroxine. Her meds will need to be reviewed carefully and I would suggest an allergist to see her as an outpatient  Her dietary needs to be worked up if she continues to have this     Rest of chronic issues are stable  Time spent:  50 minutes Full code Inpatient observation overnight at least  [facility] Pager [phone]   please contact night-coverage [email or web address] Password [facility] 05/05/2014, 4:39 PM

## 2014-05-05 NOTE — Patient Instructions (Signed)
Emergency room evaluation will be necessary because of the significant airway swelling present.

## 2014-05-05 NOTE — ED Provider Notes (Signed)
CSN: [id number]     Arrival date & time 05/05/14  1311 History   First MD Initiated Contact with Patient 05/05/14 1325     Chief Complaint  Patient presents with  . Facial Swelling     (Consider location/radiation/quality/duration/timing/severity/associated sxs/prior Treatment) HPI Comments: 69 year old female with history of diabetes, obesity, tobacco abuse, high blood pressure, gout presents with difficulty breathing and worsening anterior neck and throat swelling since last night. Patient says she's had this in the past unsure [facility] cause and resolved. Patient denies any family history of angioedema, no significant allergies, no new exposures, no new medications. Patient did have some mild dental cleaning done a week prior. No fevers mild chills. Nothing is improved or worsen the swelling. Patient did not have medicines prior to arrival. Patient sent over for angioedema.  The history is provided by the patient.    Past Medical History  Diagnosis Date  . Coronary artery disease     a. s/p inferior MI 1998->PCI of RCA. b. S/p prior PCI of the OM1 and LAD;  c. 05/2013 Cath/PCI: LM nl, LAD 90p (3.0x22 Resolute DES), 58m w/ patent stent, Diag 40p, 14m, LCX patent OM1 stent, RCA 50p, 46m, EF 65%.  . Hyperlipidemia   . Hypertension   . Obesity, unspecified   . Hypothyroidism   . Diabetes mellitus     TYPE II  . Anxiety state, unspecified   . Depressive disorder, not elsewhere classified   . Degeneration of lumbar or lumbosacral intervertebral disc   . Allergic rhinitis, cause unspecified   . Lower GI bleed 06/2010    Diverticular bleed  . Chronic LBP   . AAA (abdominal aortic aneurysm)     a. 4.7cm in 08/2012. Followed by [facility].  . Gout 08/22/2013  . Noncompliance with medications 02/26/2014   Past Surgical History  Procedure Laterality Date  . Total abdominal hysterectomy    . Lumbar fusion  01/2007    DR. Patrice Paradise...3-LEVEL WITH FIXATION  . Parathyroidectomy    . Thyroidectomy    .  Cardiac catheterization      PCI OF BOTH THE CIRCUMFLEX AND LEFT ANTERIOR DESCENDING ARTERY  . Cesarean section    . Heart stent  04-2010  and  Jun 07, 2013    X 3  . Spine surgery    . Left heart catheterization with coronary angiogram N/A 06/07/2013    Procedure: LEFT HEART CATHETERIZATION WITH CORONARY ANGIOGRAM;  Surgeon: [name], MD;  Location: [facility] CATH LAB;  Service: Cardiovascular;  Laterality: N/A;  . Left heart catheterization with coronary angiogram N/A 02/25/2014    Procedure: LEFT HEART CATHETERIZATION WITH CORONARY ANGIOGRAM;  Surgeon: Troy Sine, MD;  Location: [facility] CATH LAB;  Service: Cardiovascular;  Laterality: N/A;   Family History  Problem Relation Age of Onset  . Heart attack Mother 70    s/p D&C-CARDIAC ARREST 1966  . Heart disease Mother   . Heart attack Father 68    1978 WITH MI  . Heart disease Father   . Colon cancer Neg Hx   . Diabetes Brother   . Diabetes Maternal Aunt   . Arthritis Maternal Aunt   . Cancer Maternal Aunt    History  Substance Use Topics  . Smoking status: Former Smoker -- 30 years    Types: Cigarettes    Quit date: 05/31/1986  . Smokeless tobacco: Never Used  . Alcohol Use: No   OB History    No data available  Review of Systems  Constitutional: Negative for fever and chills.  HENT: Positive for facial swelling. Negative for congestion.   Eyes: Negative for visual disturbance.  Respiratory: Positive for shortness of breath.   Cardiovascular: Negative for chest pain.  Gastrointestinal: Negative for vomiting and abdominal pain.  Genitourinary: Negative for dysuria and flank pain.  Musculoskeletal: Negative for back pain, neck pain and neck stiffness.  Skin: Negative for rash.  Neurological: Negative for light-headedness and headaches.      Allergies  Prilosec; Miconazole nitrate; Prednisone; Augmentin; and Doxycycline  Home Medications   Prior to Admission medications   Medication Sig Start Date End  Date Taking? Authorizing Provider  acetaminophen (TYLENOL) 325 MG tablet Take 2 tablets (650 mg total) by mouth every 4 (four) hours as needed for headache or mild pain. 02/26/14   Isaiah Serge, NP  allopurinol (ZYLOPRIM) 300 MG tablet Take 1 tablet (300 mg total) by mouth daily. 01/14/14   Biagio Borg, MD  aspirin EC 81 MG tablet Take 1 tablet (81 mg total) by mouth daily. For 1 month.  Then resume taking 4 times a week (tue thur sat sun) Patient taking differently: Take 81 mg by mouth as directed. 4 times a week (tue thur sat sun) 06/25/13   Liliane Shi, PA-C  atenolol (TENORMIN) 25 MG tablet Take 1 tablet (25 mg total) by mouth 2 (two) times daily. 02/21/13   Biagio Borg, MD  cetirizine (ZYRTEC) 10 MG chewable tablet Chew 10 mg by mouth daily as needed for allergies.    Historical Provider, MD  clindamycin (CLEOCIN) 300 MG capsule Take 1 capsule (300 mg total) by mouth 3 (three) times daily. 04/10/14   Biagio Borg, MD  famotidine (PEPCID) 20 MG tablet Take 1 tablet (20 mg total) by mouth 2 (two) times daily. 04/07/14   Robbie Lis, MD  gabapentin (NEURONTIN) 300 MG capsule Take 300 mg by mouth 3 (three) times daily as needed (Patient takes 300mg  by mouth three times daily as needed for nerve pain).    Historical Provider, MD  HYDROcodone-homatropine (HYCODAN) 5-1.5 MG/5ML syrup Take 5 mLs by mouth every 6 (six) hours as needed for cough. 09/25/13   Biagio Borg, MD  hydrOXYzine (ATARAX/VISTARIL) 10 MG tablet TAKE 1 TABLET BY MOUTH THREE TIMES DAILY AS NEEDED FOR ITCHING 04/08/14   Biagio Borg, MD  meloxicam (MOBIC) 7.5 MG tablet Take 1 tablet (7.5 mg total) by mouth daily. Patient taking differently: Take 7.5 mg by mouth daily as needed for pain.  08/03/13   Debbrah Alar, NP  metFORMIN (GLUCOPHAGE-XR) 500 MG 24 hr tablet Take 4 tablets (2,000 mg total) by mouth daily with breakfast. 02/28/14   Isaiah Serge, NP  nitroGLYCERIN (NITROSTAT) 0.4 MG SL tablet Place 1 tablet (0.4 mg total) under  the tongue every 5 (five) minutes as needed. 08/16/13   Biagio Borg, MD  oxyCODONE (OXY IR/ROXICODONE) 5 MG immediate release tablet 1-2 tabs every 6 hours as needed for pain, with limit 4 tabs per day - 03/26/14   Biagio Borg, MD  SYNTHROID 150 MCG tablet TAKE 1 TABLET BY MOUTH EVERY DAY 02/10/14   Biagio Borg, MD  tiZANidine (ZANAFLEX) 4 MG tablet Take 1 tablet (4 mg total) by mouth every 6 (six) hours as needed for muscle spasms. 08/13/13   Biagio Borg, MD  traMADol (ULTRAM) 50 MG tablet Take 1 tablet (50 mg total) by mouth every 6 (six) hours as needed  for pain. 02/27/12   Biagio Borg, MD   BP 136/75 mmHg  Pulse 87  Resp 21  SpO2 97% Physical Exam  Constitutional: She is oriented to person, place, and time. She appears well-developed and well-nourished.  HENT:  Head: Normocephalic and atraumatic.  Patient has mild swelling to the tongue, no stridor, no posterior throat infection appreciated, moderate swelling submandibular and anterior cervical with mild anterior cervical adenopathy tender. Neck supple no meningismus  Eyes: Conjunctivae are normal. Right eye exhibits no discharge. Left eye exhibits no discharge.  Neck: Normal range of motion. Neck supple. No tracheal deviation present.  Cardiovascular: Normal rate and regular rhythm.   Pulmonary/Chest: Effort normal and breath sounds normal.  Abdominal: Soft. She exhibits no distension. There is no tenderness. There is no guarding.  Musculoskeletal: She exhibits no edema.  Neurological: She is alert and oriented to person, place, and time.  Skin: Skin is warm. No rash noted.  Patient has mild tenderness and erythema and warmth to malleoli right leg, no significant effusion to the joint. similar to previous gout per patient.  Psychiatric: She has a normal mood and affect.  Nursing note and vitals reviewed.   ED Course  Procedures (including critical care time) CRITICAL CARE Performed by: Mariea Clonts   Total critical care  time: 35 min  Critical care time was exclusive of separately billable procedures and treating other patients.  Critical care was necessary to treat or prevent imminent or life-threatening deterioration.  Critical care was time spent personally by me on the following activities: development of treatment plan with patient and/or surrogate as well as nursing, discussions with consultants, evaluation of patient's response to treatment, examination of patient, obtaining history from patient or surrogate, ordering and performing treatments and interventions, ordering and review of laboratory studies, ordering and review of radiographic studies, pulse oximetry and re-evaluation of patient's condition.  Labs Review Labs Reviewed  BASIC METABOLIC PANEL - Abnormal; Notable for the following:    Glucose, Bld 108 (*)    GFR calc non Af Amer 72 (*)    GFR calc Af Amer 83 (*)    All other components within normal limits  RAPID STREP SCREEN  CULTURE, GROUP A STREP  CBC WITH DIFFERENTIAL/PLATELET  I-STAT TROPOININ, ED    Imaging Review Ct Soft Tissue Neck W Contrast  05/05/2014   CLINICAL DATA:  Facial swelling.  Unable to swallow.  EXAM: CT NECK WITH CONTRAST  TECHNIQUE: Multidetector CT imaging of the neck was performed using the standard protocol following the bolus administration of intravenous contrast.  CONTRAST:  91mL OMNIPAQUE IOHEXOL 300 MG/ML  SOLN  COMPARISON:  CT scan of the neck dated 04/16/2004 and MRI dated 01/03/2005 and CT dated 04/06/2014  FINDINGS: Pharynx and larynx: There is slight prominence of the soft tissues in the region of the left lingual tonsil but there is no abscess or definable mass. This narrows the airway at the level of the epiglottis.  Salivary glands: The parotid and submandibular glands are normal. There is very subtle soft tissue stranding lateral to both submandibular glands best seen on image 54 of series 7.  Thyroid: The patient has had a thyroidectomy. There is a 13  mm thyroid remnant to the right of midline.  Lymph nodes: No adenopathy.  Vascular: No significant abnormality. Tortuosity of the brachiocephalic vessels.  Limited intracranial: Normal.  Visualized orbits: Normal.  Mastoids and visualized paranasal sinuses: Normal.  Skeleton: Diffuse degenerative disc and joint disease in the cervical  spine, unchanged since the prior study of 04/06/2014.  Upper chest: Normal.  IMPRESSION: Very subtle soft tissue stranding in the subcutaneous fat just lateral to both some submandibular glands. The glands themselves appear normal. This is nonspecific.  Improved slight soft tissue asymmetry in the left hypopharynx. This is felt represent slight enlargement of the left lingual tonsil.   Electronically Signed   By: Lorriane Shire M.D.   On: 05/05/2014 16:18     EKG Interpretation   Date/Time:  Monday May 05 2014 13:37:54 EDT Ventricular Rate:  79 PR Interval:  156 QRS Duration: 105 QT Interval:  402 QTC Calculation: 461 R Axis:   -65 Text Interpretation:  Sinus rhythm Paired ventricular premature complexes  Aberrant conduction of SV complex(es) LAD, consider left anterior  fascicular block Abnormal R-wave progression, late transition Confirmed by  Danell Verno  MD, Molina Hollenback ([id number]) on 05/05/2014 4:29:14 PM      MDM   Final diagnoses:  Angioedema, initial encounter  Chest pain, unspecified chest pain type   Patient presents with angioedema, no obvious source, discussed possibly submandibular infection from recent dental work. Steroids, Benadryl IV, IV fluids and CT soft tissue neck ordered for further delineation, close observation ER. Epinephrine ordered.  Patient stable and mild improvement on reassessment. CT scan results reviewed no significant findings, mild asymmetry. Patient developed chest pain anterior nonradiating while in the ER, patient states she has a history of cardiac stents and coronary artery disease. EKG repeated no acute findings. Plan for troponin  and observation for both angioedema and chest pain. Patient comfortable this plan. [facility] paged  Discussed with [facility] agreed with observation, recommended consult and ENT since patient has seen them in the past for epiglottitis. The patients results and plan were reviewed and discussed.   Any x-rays performed were personally reviewed by myself.   Differential diagnosis were considered with the presenting HPI.  Medications  aspirin chewable tablet 324 mg (not administered)  methylPREDNISolone sodium succinate (SOLU-MEDROL) 125 mg/2 mL injection 125 mg (125 mg Intravenous Given 05/05/14 1412)  diphenhydrAMINE (BENADRYL) injection 25 mg (25 mg Intravenous Given 05/05/14 1411)  EPINEPHrine (EPI-PEN) injection 0.3 mg (0.3 mg Intramuscular Given 05/05/14 1411)  iohexol (OMNIPAQUE) 300 MG/ML solution 75 mL (75 mLs Intravenous Contrast Given 05/05/14 1535)    Filed Vitals:   05/05/14 1515 05/05/14 1552 05/05/14 1600 05/05/14 1615  BP: 135/79 125/74  136/75  Pulse: 102  90 87  Resp: 21     SpO2: 97%  98% 97%    Final diagnoses:  Angioedema, initial encounter  Chest pain, unspecified chest pain type    Admission/ observation were discussed with the admitting physician, patient and/or family and they are comfortable with the plan.    Elnora Morrison, MD 05/05/14 [id number]

## 2014-05-05 NOTE — Progress Notes (Signed)
   Subjective:    Patient ID: [name], female    DOB: 10/18/1945, 69 y.o.   MRN: [id number]  HPI Her symptoms began yesterday afternoon as a flare of gout in the right lateral ankle for which she took some allopurinol later in the day; she did not take colchicine.BEFORE taking the Allopurinol she began to have swelling of her tongue on the right side.  There were no specific triggers for this. She did eat coleslaw, baked beans, and chicken wings earlier in the day. She denies the intake of any new medications or hyperallergenic foods such as nuts, chocolate, shellfish,or  tomatoes.  She is not on ACE inhibitor or ARB. She has a history of swelling of her lips & tongue with prednisone.  She's had associated low-grade fever and chills. She's been unable to eat due to the swelling of the neck. Swelling is associated with drooling.  She has some itchy, watery eyes but no other extrinsic symptoms.  She denies symptoms of upper respiratory tract infection.  She's had no blurred vision, double vision, loss of vision.  She was hospitalized 04/06/14-04/07/14 with epiglottitis and neck swelling. She was discharged on clindamycin to complete a ten-day course. The discharge note indicates follow-up was to be with Dr. Redmond Baseman, ENT. This was not done.     Review of Systems Frontal headache, facial pain , nasal purulence, dental pain,  or otic discharge denied.  Symptoms of  sneezing,  significant cough, sputum production, wheezing,or  paroxysmal nocturnal dyspnea absent.     Objective:   Physical Exam  Pertinent or positive findings include:  Extraocular motion & vision intact; she has some lateral nystagmus. There is glossal and subglossal tissue swelling. Oropharynx can not be visualized. She is very hoarse with dysarthria. She has marked submandibular swelling with marked tenderness, right greater than left. Decreased breath sounds. She has swelling and marked erythema of the right  lateral ankle.  General appearance :adequately nourished; in no acute respiratory distress. Eyes: No conjunctival inflammation or scleral icterus is present. Oral exam:  Lips and gums are healthy appearing. Heart:  Normal rate and regular rhythm. S1 and S2 normal without gallop, murmur, click, rub or other extra sounds   Lungs:Chest clear to auscultation; no wheezes, rhonchi,rales ,or rubs present.No increased work of breathing.  Abdomen: bowel sounds normal, soft and non-tender without masses, organomegaly or hernias noted.  No guarding or rebound. Vascular : all pulses equal ; no bruits present. Skin:Warm & dry.  Intact without suspicious lesions or rashes ; no tenting  Lymphatic: No lymphadenopathy is noted about the head, neck, axilla Neuro: Strength, tone normal.        Assessment & Plan:  #1 angioedema,?etiology #2 acute gouty arthritis #3 PMH lip swelling with Prednisone EMS transport; observation in ICU clinically indicated

## 2014-05-05 NOTE — ED Notes (Signed)
Pt off unit with CT 

## 2014-05-05 NOTE — ED Notes (Signed)
Pt here from MD office with c/o swelling to the tongue and throat , pt has history of same , airway  is intact

## 2014-05-06 DIAGNOSIS — I10 Essential (primary) hypertension: Secondary | ICD-10-CM

## 2014-05-06 DIAGNOSIS — R072 Precordial pain: Secondary | ICD-10-CM | POA: Diagnosis not present

## 2014-05-06 DIAGNOSIS — T783XXA Angioneurotic edema, initial encounter: Secondary | ICD-10-CM | POA: Diagnosis not present

## 2014-05-06 DIAGNOSIS — F1910 Other psychoactive substance abuse, uncomplicated: Secondary | ICD-10-CM

## 2014-05-06 DIAGNOSIS — E785 Hyperlipidemia, unspecified: Secondary | ICD-10-CM

## 2014-05-06 DIAGNOSIS — Z9119 Patient's noncompliance with other medical treatment and regimen: Secondary | ICD-10-CM | POA: Diagnosis not present

## 2014-05-06 DIAGNOSIS — I2510 Atherosclerotic heart disease of native coronary artery without angina pectoris: Secondary | ICD-10-CM

## 2014-05-06 DIAGNOSIS — T783XXD Angioneurotic edema, subsequent encounter: Secondary | ICD-10-CM | POA: Diagnosis not present

## 2014-05-06 LAB — GLUCOSE, CAPILLARY
GLUCOSE-CAPILLARY: 215 mg/dL — AB (ref 70–99)
GLUCOSE-CAPILLARY: 259 mg/dL — AB (ref 70–99)
Glucose-Capillary: 163 mg/dL — ABNORMAL HIGH (ref 70–99)
Glucose-Capillary: 239 mg/dL — ABNORMAL HIGH (ref 70–99)
Glucose-Capillary: 223 mg/dL — ABNORMAL HIGH (ref 70–99)
Glucose-Capillary: 289 mg/dL — ABNORMAL HIGH (ref 70–99)
Glucose-Capillary: 186 mg/dL — ABNORMAL HIGH (ref 70–99)

## 2014-05-06 LAB — COMPREHENSIVE METABOLIC PANEL
ALK PHOS: 74 U/L (ref 39–117)
ALT: 16 U/L (ref 0–35)
AST: 23 U/L (ref 0–37)
Albumin: 3.2 g/dL — ABNORMAL LOW (ref 3.5–5.2)
Anion gap: 12 (ref 5–15)
BILIRUBIN TOTAL: 0.7 mg/dL (ref 0.3–1.2)
BUN: 7 mg/dL (ref 6–23)
CHLORIDE: 105 mmol/L (ref 96–112)
CO2: 24 mmol/L (ref 19–32)
Calcium: 8.8 mg/dL (ref 8.4–10.5)
Creatinine, Ser: 0.85 mg/dL (ref 0.50–1.10)
GFR calc non Af Amer: 69 mL/min — ABNORMAL LOW (ref >90)
GFR, EST AFRICAN AMERICAN: 80 mL/min — AB (ref >90)
GLUCOSE: 150 mg/dL — AB (ref 70–99)
POTASSIUM: 3.7 mmol/L (ref 3.5–5.1)
SODIUM: 141 mmol/L (ref 135–145)
Total Protein: 7 g/dL (ref 6.0–8.3)

## 2014-05-06 LAB — CBC
HCT: 41.4 % (ref 36.0–46.0)
HEMOGLOBIN: 13.5 g/dL (ref 12.0–15.0)
MCH: 28.4 pg (ref 26.0–34.0)
MCHC: 32.6 g/dL (ref 30.0–36.0)
MCV: 87 fL (ref 78.0–100.0)
PLATELETS: 301 10*3/uL (ref 150–400)
RBC: 4.76 MIL/uL (ref 3.87–5.11)
RDW: 14.2 % (ref 11.5–15.5)
WBC: 10.2 10*3/uL (ref 4.0–10.5)

## 2014-05-06 LAB — URIC ACID: URIC ACID, SERUM: 6.9 mg/dL (ref 2.4–7.0)

## 2014-05-06 LAB — PROTIME-INR
INR: 1.11 (ref 0.00–1.49)
Prothrombin Time: 14.4 seconds (ref 11.6–15.2)

## 2014-05-06 LAB — TROPONIN I
Troponin I: <0.03 ng/mL (ref <0.031)
Troponin I: <0.03 ng/mL (ref <0.031)

## 2014-05-06 MED ORDER — IBUPROFEN 200 MG PO TABS
400.0000 mg | ORAL_TABLET | Freq: Four times a day (QID) | ORAL | Status: DC | PRN
  Administered 2014-05-06 (×2): 400 mg via ORAL
  Filled 2014-05-06 (×2): qty 2

## 2014-05-06 MED ORDER — COLCHICINE 0.6 MG PO TABS
0.6000 mg | ORAL_TABLET | Freq: Two times a day (BID) | ORAL | Status: DC
  Administered 2014-05-06 – 2014-05-07 (×2): 0.6 mg via ORAL
  Filled 2014-05-06 (×2): qty 1

## 2014-05-06 MED ORDER — ACETAMINOPHEN 325 MG PO TABS
650.0000 mg | ORAL_TABLET | ORAL | Status: DC | PRN
  Administered 2014-05-06 (×2): 650 mg via ORAL
  Filled 2014-05-06 (×2): qty 2

## 2014-05-06 NOTE — Consult Note (Signed)
CARDIOLOGY CONSULT NOTE   Patient ID: [name] MRN: [id number], DOB/AGE: 1945-05-18   Admit date: 05/05/2014 Date of Consult: 05/06/2014  Primary Physician: [name], MD Primary Cardiologist: Dr Burt Knack  Reason for consult:  Chest pain  Problem List  Past Medical History  Diagnosis Date  . Coronary artery disease     a. s/p inferior MI 1998->PCI of RCA. b. S/p prior PCI of the OM1 and LAD;  c. 05/2013 Cath/PCI: LM nl, LAD 90p (3.0x22 Resolute DES), 21m w/ patent stent, Diag 40p, 55m, LCX patent OM1 stent, RCA 50p, 50m, EF 65%.  . Hyperlipidemia   . Hypertension   . Obesity, unspecified   . Hypothyroidism   . Diabetes mellitus     TYPE II  . Anxiety state, unspecified   . Depressive disorder, not elsewhere classified   . Degeneration of lumbar or lumbosacral intervertebral disc   . Allergic rhinitis, cause unspecified   . Lower GI bleed 06/2010    Diverticular bleed  . Chronic LBP   . AAA (abdominal aortic aneurysm)     a. 4.7cm in 08/2012. Followed by [facility].  . Gout 08/22/2013  . Noncompliance with medications 02/26/2014    Past Surgical History  Procedure Laterality Date  . Total abdominal hysterectomy    . Lumbar fusion  01/2007    DR. Patrice Paradise...3-LEVEL WITH FIXATION  . Parathyroidectomy    . Thyroidectomy    . Cardiac catheterization      PCI OF BOTH THE CIRCUMFLEX AND LEFT ANTERIOR DESCENDING ARTERY  . Cesarean section    . Heart stent  04-2010  and  Jun 07, 2013    X 3  . Spine surgery    . Left heart catheterization with coronary angiogram N/A 06/07/2013    Procedure: LEFT HEART CATHETERIZATION WITH CORONARY ANGIOGRAM;  Surgeon: [name], MD;  Location: [facility] CATH LAB;  Service: Cardiovascular;  Laterality: N/A;  . Left heart catheterization with coronary angiogram N/A 02/25/2014    Procedure: LEFT HEART CATHETERIZATION WITH CORONARY ANGIOGRAM;  Surgeon: Troy Sine, MD;  Location: [facility] CATH LAB;  Service: Cardiovascular;  Laterality: N/A;      Allergies  Allergies  Allergen Reactions  . Prilosec [Omeprazole] Other (See Comments)    Chest pain  . Miconazole Nitrate Hives    REACTION: hives  . Prednisone Swelling    Tongue/lip swelling.  . Augmentin [Amoxicillin-Pot Clavulanate] Hives, Itching and Rash  . Doxycycline Other (See Comments)    REACTION: gi upset    HPI   69 year old obese female, who was last seen by Dr. Burt Knack in March 2015, with a history of coronary artery disease with previous stenting of the LAD and left circumflex. History also includes hypertension, hypothyroidism, diabetes mellitus type 2, anxiety. Her heart catheterization in 2012 demonstrated total occlusion of the left circumflex distal to the previously stented segment. She was treated with a bare-metal stent. She otherwise was found to have patency of the LAD and moderate stenosis in the right coronary artery with medical therapy recommended. Her last cath was May 2015 and revealed severe stenosis in the proximal LAD. This was stented with a resolute DES.She reports taking the plavix for about three months and then brilinta for about a week and then stopping it because of the way it made her feel. Dr. Angelena Form recommended life-long DAPT. Stopped DAPT after three months and even that period is questionable.Normal LVF. She was Last lipid panel below. The patient also has a small moderate sized  abdominal aortic aneurysm followed by Dr. early. On most recent evaluation it was 4.2 cm in maximal dimension. She was admitted on 02/24/2014 with chest pain and left shoulder pain x two weeks. ECG was unchanged, and she ruled out for NSTEMI.  She underwent another coronary angiography on 02/25/2014 that showed Normal LV function with an ejection fraction of 55-60% and minimal residual focal mid inferior hypocontractility. Mild not significantly obstructive CAD with widely patent stents in the LAD, circumflex marginal vessel, and 40% diffuse narrowing in the  proximal RCA. Medical therapy was recommended.  She was admitted on 04/06/14 with epiglottitis/tonsilitis on 10 day course of clindamycin.   She was readmitted yesterday with multiple complaints. She has had right lower extremity pain and discomfort with her gout for the past 1-2 days she's also felt swelling in her mouth/tongue for the past 24 hours similar to her recent episode of Epiglottitis,  she then subsequently complained to the emergency physician about chest pain going on since arriving in the emergency room, nonradiating, sharp, then she has had 4-5 episodes of diarrhea over the past 2-3 days. She had no nausea vomiting and was able to eat.  She is currently chest pain free. Asking for narcotics for her foot pain and tongue pain.   Inpatient Medications  . atenolol  25 mg Oral BID  . famotidine  20 mg Oral BID  . heparin  5,000 Units Subcutaneous 3 times per day  . insulin aspart  0-15 Units Subcutaneous 6 times per day  . levothyroxine  150 mcg Oral QAC breakfast  . methylPREDNISolone (SOLU-MEDROL) injection  125 mg Intravenous Q6H  . sodium chloride  3 mL Intravenous Q12H    Family History Family History  Problem Relation Age of Onset  . Heart attack Mother 31    s/p D&C-CARDIAC ARREST 1966  . Heart disease Mother   . Heart attack Father 69    1978 WITH MI  . Heart disease Father   . Colon cancer Neg Hx   . Diabetes Brother   . Diabetes Maternal Aunt   . Arthritis Maternal Aunt   . Cancer Maternal Aunt     Social History History   Social History  . Marital Status: Divorced    Spouse Name: N/A  . Number of Children: 3  . Years of Education: N/A   Occupational History  . [employer]MIN [employer]  .     Social History Main Topics  . Smoking status: Former Smoker -- 30 years    Types: Cigarettes    Quit date: 05/31/1986  . Smokeless tobacco: Never Used  . Alcohol Use: No  . Drug Use: No  . Sexual Activity: Not on file   Other Topics  Concern  . Not on file   Social History Narrative   DIVORCED   3 CHILDREN   PATIENT SIGNED A DESIGNATED PARTY RELEASE TO ALLOW HER DAUGHTER, TRAMAINE Travieso, TO HAVE ACCESS TO HER MEDICAL RECORDS/INFORMATION. [name], May 04, 2009 @ 3:27 PM   Smokes cigarettes on rare occasions.    Review of Systems  General:  No chills, fever, night sweats or weight changes.  Cardiovascular:  No chest pain, dyspnea on exertion, edema, orthopnea, palpitations, paroxysmal nocturnal dyspnea. Dermatological: No rash, lesions/masses Respiratory: No cough, dyspnea Urologic: No hematuria, dysuria Abdominal:   No nausea, vomiting, diarrhea, bright red blood per rectum, melena, or hematemesis Neurologic:  No visual changes, wkns, changes in mental status. All  other systems reviewed and are otherwise negative except as noted above.  Physical Exam  Blood pressure 130/68, pulse 55, temperature 97.7 F (36.5 C), temperature source Oral, resp. rate 22, height 5\' 6"  (1.676 m), weight 223 lb 14.4 oz (101.56 kg), SpO2 100 %.  General: Pleasant, NAD Psych: Normal affect. Neuro: Alert and oriented X 3. Moves all extremities spontaneously. HEENT: Normal  Neck: Supple without bruits or JVD. Lungs:  Resp regular and unlabored, CTA. Heart: RRR no s3, s4, or murmurs. Abdomen: Soft, non-tender, non-distended, BS + x 4.  Extremities: No clubbing, cyanosis , mild edema and erythema around lateral malleolus . DP/PT/Radials 2+ and equal bilaterally.  Labs  Recent Labs  05/05/14 1700 05/05/14 2358 05/06/14 0503  TROPONINI <0.03 <0.03 <0.03   Lab Results  Component Value Date   WBC 10.2 05/06/2014   HGB 13.5 05/06/2014   HCT 41.4 05/06/2014   MCV 87.0 05/06/2014   PLT 301 05/06/2014    Recent Labs Lab 05/06/14 0503  NA 141  K 3.7  CL 105  CO2 24  BUN 7  CREATININE 0.85  CALCIUM 8.8  PROT 7.0  BILITOT 0.7  ALKPHOS 74  ALT 16  AST 23  GLUCOSE 150*   Lab Results  Component Value Date    CHOL 184 08/22/2013   HDL 32.00* 08/22/2013   LDLCALC 118* 08/22/2013   TRIG 170.0* 08/22/2013   Lab Results  Component Value Date   DDIMER * 04/10/2010    1.24        AT THE INHOUSE ESTABLISHED CUTOFF VALUE OF 0.48 ug/mL FEU, THIS ASSAY HAS BEEN DOCUMENTED IN THE LITERATURE TO HAVE A SENSITIVITY AND NEGATIVE PREDICTIVE VALUE OF AT LEAST 98 TO 99%.  THE TEST RESULT SHOULD BE CORRELATED WITH AN ASSESSMENT OF THE CLINICAL PROBABILITY OF DVT / VTE.   Radiology/Studies  Dg Chest 2 View  04/06/2014   CLINICAL DATA:  Recent allergic reaction with shortness of Breath  EXAM: CHEST  2 VIEW  COMPARISON:  02/24/2014  FINDINGS: Cardiac shadow is within normal limits. The lungs are well aerated bilaterally. Minimal left basilar atelectasis is seen. No focal confluent infiltrate is noted. Degenerative changes of the thoracic spine are noted.  IMPRESSION: Minimal left basilar atelectasis.   Electronically Signed   By: Inez Catalina M.D.   On: 04/06/2014 09:37   Ct Soft Tissue Neck W Contrast  05/05/2014   CLINICAL DATA:  Facial swelling.  Unable to swallow.    IMPRESSION: Very subtle soft tissue stranding in the subcutaneous fat just lateral to both some submandibular glands. The glands themselves appear normal. This is nonspecific.  Improved slight soft tissue asymmetry in the left hypopharynx. This is felt represent slight enlargement of the left lingual tonsil.   Electronically Signed   By: Lorriane Shire M.D.   On: 05/05/2014 16:18   Ct Soft Tissue Neck W Contrast  04/06/2014   CLINICAL DATA:  New new   IMPRESSION: 1. Pharyngeal mucosal edema within the left hypopharynx consistent with pharyngitis/ tonsillitis. Mucosal edema and extends the piriform sinus. No mucosal lesion identified. No evidence of abscess. Recommend appropriate therapy and if persistent symptoms recommend reimaging or direct visualization. 2. No airway compromise   Electronically Signed   By: Suzy Bouchard M.D.   On:  04/06/2014 09:45   Echocardiogram   ECG: SR, iRBBB, LAFB, unchanged from prior ECGs     ASSESSMENT AND PLAN  69 year old obese, non-compliant patient with DM, HTN, HLP admitted with  angioedema, started on iv Solu-medrol  1. Atypical chest pain, the patient stated that she only took DAPT for 3 months following stenting in 05/2013, then admitted to Dr Burt Knack that she has actually never taken it because of cost and refuses to take Plavix. At the last visit she was explained the importance of DAPT to reduce risk of stent thrombosis and advised to take it indefinitely.  She has drug seeking behaviour, asking for narcotics.   - troponin negative x 3 - ECG unchanged - restart ASA, Brillinta, rosuvastatin - non-obstructive cath on 02/25/2014, medical management - no further work up needed at this point  2. HTN _ controlled on atenolol  3. HLP - restart rosuvastatin    Signed, Dorothy Spark, MD, [facility] 05/06/2014, 8:04 AM

## 2014-05-06 NOTE — Progress Notes (Signed)
UR completed 

## 2014-05-06 NOTE — Progress Notes (Signed)
[facility] PROGRESS NOTE  [name] BFX:[id number] DOB: 30-Jul-1945 DOA: 05/05/2014 PCP: [name], MD Interim summary  69 year old lady admitted for neck pain and tongue pain.  Assessment/Plan: Tongue swelling an dpain: appears to have improved.  i have stopped teh solumedrol and watch her off steroids.    Atypical chest pain: resolved. , cardiac enzymes negative, EKG does not show any ischemic changes.    Hypertension : controlled  Right ankle pain: gout flare up. Started her on colchicine.    Hypothyroidism: Resume synthroid.   Code Status: full code Family Communication: none at bedside Disposition Plan: pending.    Consultants:  cardiology  Procedures:  none  Antibiotics:  none  HPI/Subjective: Reports right ankle pain,   Objective: Filed Vitals:   05/06/14 1456  BP: 114/60  Pulse: 63  Temp: 98 F (36.7 C)  Resp: 20    Intake/Output Summary (Last 24 hours) at 05/06/14 1719 Last data filed at 05/06/14 0700  Gross per 24 hour  Intake   1500 ml  Output    300 ml  Net   1200 ml   Filed Weights   05/05/14 1800 05/06/14 0500  Weight: 101.379 kg (223 lb 8 oz) 101.56 kg (223 lb 14.4 oz)    Exam:   General:  Alert afebrile comfortable  Cardiovascular: s1s2  Respiratory: clear to auscultation, no wheezing or rhonchi  Abdomen: soft non tender non distended bowel sounds heard  Musculoskeletal: no pedal edema.   Data Reviewed: Basic Metabolic Panel:  Recent Labs Lab 05/05/14 1357 05/06/14 0503  NA 139 141  K 3.7 3.7  CL 105 105  CO2 22 24  GLUCOSE 108* 150*  BUN 7 7  CREATININE 0.82 0.85  CALCIUM 8.5 8.8   Liver Function Tests:  Recent Labs Lab 05/06/14 0503  AST 23  ALT 16  ALKPHOS 74  BILITOT 0.7  PROT 7.0  ALBUMIN 3.2*   No results for input(s): LIPASE, AMYLASE in the last 168 hours. No results for input(s): AMMONIA in the last 168 hours. CBC:  Recent Labs Lab 05/05/14 1357 05/06/14 0503  WBC 10.1  10.2  NEUTROABS 6.6  --   HGB 13.6 13.5  HCT 41.9 41.4  MCV 87.7 87.0  PLT 274 301   Cardiac Enzymes:  Recent Labs Lab 05/05/14 1700 05/05/14 2358 05/06/14 0503  TROPONINI <0.03 <0.03 <0.03   BNP (last 3 results) No results for input(s): BNP in the last 8760 hours.  ProBNP (last 3 results)  Recent Labs  06/06/13 1155  PROBNP 39.9    CBG:  Recent Labs Lab 05/05/14 2059 05/06/14 0016 05/06/14 0359 05/06/14 0758 05/06/14 1212  GLUCAP 316* 259* 163* 239* 223*    Recent Results (from the past 240 hour(s))  Rapid strep screen     Status: None   Collection Time: 05/05/14  3:56 PM  Result Value Ref Range Status   Streptococcus, Group A Screen (Direct) NEGATIVE NEGATIVE Final    Comment: (NOTE) A Rapid Antigen test may result negative if the antigen level in the sample is below the detection level of this test. The FDA has not cleared this test as a stand-alone test therefore the rapid antigen negative result has reflexed to a Group A Strep culture.      Studies: Ct Soft Tissue Neck W Contrast  05/05/2014   CLINICAL DATA:  Facial swelling.  Unable to swallow.  EXAM: CT NECK WITH CONTRAST  TECHNIQUE: Multidetector CT imaging of the neck was performed  using the standard protocol following the bolus administration of intravenous contrast.  CONTRAST:  38mL OMNIPAQUE IOHEXOL 300 MG/ML  SOLN  COMPARISON:  CT scan of the neck dated 04/16/2004 and MRI dated 01/03/2005 and CT dated 04/06/2014  FINDINGS: Pharynx and larynx: There is slight prominence of the soft tissues in the region of the left lingual tonsil but there is no abscess or definable mass. This narrows the airway at the level of the epiglottis.  Salivary glands: The parotid and submandibular glands are normal. There is very subtle soft tissue stranding lateral to both submandibular glands best seen on image 54 of series 7.  Thyroid: The patient has had a thyroidectomy. There is a 13 mm thyroid remnant to the right of  midline.  Lymph nodes: No adenopathy.  Vascular: No significant abnormality. Tortuosity of the brachiocephalic vessels.  Limited intracranial: Normal.  Visualized orbits: Normal.  Mastoids and visualized paranasal sinuses: Normal.  Skeleton: Diffuse degenerative disc and joint disease in the cervical spine, unchanged since the prior study of 04/06/2014.  Upper chest: Normal.  IMPRESSION: Very subtle soft tissue stranding in the subcutaneous fat just lateral to both some submandibular glands. The glands themselves appear normal. This is nonspecific.  Improved slight soft tissue asymmetry in the left hypopharynx. This is felt represent slight enlargement of the left lingual tonsil.   Electronically Signed   By: Lorriane Shire M.D.   On: 05/05/2014 16:18    Scheduled Meds: . atenolol  25 mg Oral BID  . colchicine  0.6 mg Oral BID  . famotidine  20 mg Oral BID  . heparin  5,000 Units Subcutaneous 3 times per day  . insulin aspart  0-15 Units Subcutaneous 6 times per day  . levothyroxine  150 mcg Oral QAC breakfast  . sodium chloride  3 mL Intravenous Q12H   Continuous Infusions:   Active Problems:   Angioedema   Acute epiglottitis without obstruction    Time spent: 25 minutes    [facility] Pager [phone] . If 7PM-7AM, please contact night-coverage at [email or web address], password [facility] 05/06/2014, 5:19 PM

## 2014-05-07 ENCOUNTER — Telehealth: Payer: Self-pay | Admitting: *Deleted

## 2014-05-07 DIAGNOSIS — I10 Essential (primary) hypertension: Secondary | ICD-10-CM | POA: Diagnosis not present

## 2014-05-07 DIAGNOSIS — M1040 Other secondary gout, unspecified site: Secondary | ICD-10-CM | POA: Diagnosis not present

## 2014-05-07 DIAGNOSIS — T783XXA Angioneurotic edema, initial encounter: Secondary | ICD-10-CM | POA: Diagnosis not present

## 2014-05-07 LAB — GLUCOSE, CAPILLARY
Glucose-Capillary: 194 mg/dL — ABNORMAL HIGH (ref 70–99)
Glucose-Capillary: 135 mg/dL — ABNORMAL HIGH (ref 70–99)

## 2014-05-07 LAB — CULTURE, GROUP A STREP: Strep A Culture: NEGATIVE

## 2014-05-07 MED ORDER — EPINEPHRINE 0.3 MG/0.3ML IJ SOAJ: 0.3000 mg | Freq: Once | INTRAMUSCULAR | Status: DC

## 2014-05-07 MED ORDER — METHYLPREDNISOLONE 4 MG PO TBPK: ORAL_TABLET | ORAL | Status: DC

## 2014-05-07 NOTE — Progress Notes (Signed)
UR completed 

## 2014-05-07 NOTE — Discharge Summary (Signed)
[name], is a 69 y.o. female  DOB 01-31-1945  MRN [id number].  Admission date:  05/05/2014  Admitting Physician  Nita Sells, MD  Discharge Date:  05/07/2014   Primary MD  [name], MD  Recommendations for primary care physician for things to follow:   Must follow with an allergist immunologist in a week for 2 episodes of angioedema. Have stopped NSAIDs and allopurinol for now.   Admission Diagnosis  Angioedema, initial encounter [T78.3XXA] Chest pain, unspecified chest pain type [R07.9]   Discharge Diagnosis  Angioedema, initial encounter [T78.3XXA] Chest pain, unspecified chest pain type [R07.9]     Active Problems:   Diabetes   Hyperlipidemia   Essential hypertension   Gout   Noncompliance with medications   Angioedema   Acute epiglottitis without obstruction      Past Medical History  Diagnosis Date  . Coronary artery disease     a. s/p inferior MI 1998->PCI of RCA. b. S/p prior PCI of the OM1 and LAD;  c. 05/2013 Cath/PCI: LM nl, LAD 90p (3.0x22 Resolute DES), 22m w/ patent stent, Diag 40p, 62m, LCX patent OM1 stent, RCA 50p, 72m, EF 65%.  . Hyperlipidemia   . Hypertension   . Obesity, unspecified   . Hypothyroidism   . Diabetes mellitus     TYPE II  . Anxiety state, unspecified   . Depressive disorder, not elsewhere classified   . Degeneration of lumbar or lumbosacral intervertebral disc   . Allergic rhinitis, cause unspecified   . Lower GI bleed 06/2010    Diverticular bleed  . Chronic LBP   . AAA (abdominal aortic aneurysm)     a. 4.7cm in 08/2012. Followed by [facility].  . Gout 08/22/2013  . Noncompliance with medications 02/26/2014    Past Surgical History  Procedure Laterality Date  . Total abdominal hysterectomy    . Lumbar fusion  01/2007    DR. Patrice Paradise...3-LEVEL WITH  FIXATION  . Parathyroidectomy    . Thyroidectomy    . Cardiac catheterization      PCI OF BOTH THE CIRCUMFLEX AND LEFT ANTERIOR DESCENDING ARTERY  . Cesarean section    . Heart stent  04-2010  and  Jun 07, 2013    X 3  . Spine surgery    . Left heart catheterization with coronary angiogram N/A 06/07/2013    Procedure: LEFT HEART CATHETERIZATION WITH CORONARY ANGIOGRAM;  Surgeon: [name], MD;  Location: [facility] CATH LAB;  Service: Cardiovascular;  Laterality: N/A;  . Left heart catheterization with coronary angiogram N/A 02/25/2014    Procedure: LEFT HEART CATHETERIZATION WITH CORONARY ANGIOGRAM;  Surgeon: Troy Sine, MD;  Location: [facility] CATH LAB;  Service: Cardiovascular;  Laterality: N/A;       History of present illness and  Hospital Course:     Kindly see H&P for history of present illness and admission details, please review complete Labs, Consult reports and Test reports for all details in brief  HPI  from the history and physical done on the  day of admission   Brandi y/o ?, CAd s/p PCI 1998, Rpt 05/2013--Last H cath 02/2014=Nl EF 55-60%--no intervention performed at the time, Dmty 2, HLd, Htn, hypothyroid. bipolar 1, DDD, L GIB 2012, AAA 08/2012, Gout, documented non-compliance, hyperthyroid status post thyroidectomy as well as parathyroidectomy about 45 years ago recent admit 3/20-->3/21"throat swelling"-Rx Clindamycin [took?] Re-presented to urgent care with a multitude of complaints. She has had right lower extremity pain and discomfort with her gout for the past 1-2 days she's also felt swelling in her mouth/tongue for the past 24 hours similar to her recent episode of? Epiglottitis that she was admitted for Denies any nausea vomiting No new medications or outside food that were different States that "my tongue has been more swollen in is because by mouth" "My lower lip was swollen as well and it was huge."  She then subsequently complained to the emergency physician about  chest pain going on since arriving in the emergency room. Nonradiating Finally she has had 4-5 episodes of diarrhea over the past 2-3 days-she went to the post office yesterday had an episode and then had another 2-3 episodes. She had no nausea vomiting and was able to tolerate a meal of chicken wings coleslaw as well as pork chops last night for dinner  Labs within normal limits CT scan neck slightly glands by soft tissue subtle stranding to post submandibular glands as well  Hospital Course    1. Angioedema. Second episode. Question if her home dose NSAIDs or allopurinol were causing these, completely resolved now after IV Solu-Medrol, will start possible offending drugs, placed on Medrol Dosepak, EpiPen injection provided. Outpatient allergy immunology follow-up recommended within a week.   2. Atypical chest pain in the ER. Completely resolved, EKG card he can Lyme's unremarkable.   3. History of gout. Currently on Medrol Dosepak, no acute issues, stopped NSAIDs and allopurinol as they can cause angioedema. Outpatient follow-up with PCP.   4. Hypothyroidism. On home dose Synthroid.      Discharge Condition: Stable   Follow UP  Follow-up Information    Follow up with [name], MD. Schedule an appointment as soon as possible for a visit in 1 week.   Specialties:  Internal Medicine, Radiology   Contact information:   [address] [place] [address] [phone]       Follow up with BOBBITT, RALPH, MD. Schedule an appointment as soon as possible for a visit in 1 week.   Specialty:  Allergy and Immunology   Why:  2 episodes of angioedema   Contact information:   [place] [address] [phone]         Discharge Instructions  and  Discharge Medications     Discharge Instructions    Diet - low sodium heart healthy    Complete by:  As directed      Discharge instructions    Complete by:  As directed   Follow with Primary MD [name], MD in 7 days   Get CBC, CMP, 2 view Chest X ray checked  by Primary MD next visit.    Activity: As tolerated with Full fall precautions use walker/cane & assistance as needed   Disposition Home     Diet: Heart Healthy  .  For Heart failure patients - Check your Weight same time everyday, if you gain over 2 pounds, or you develop in leg swelling, experience more shortness of breath or chest pain, call your Primary  MD immediately. Follow Cardiac Low Salt Diet and 1.5 lit/day fluid restriction.   On your next visit with your primary care physician please Get Medicines reviewed and adjusted.   Please request your Prim.MD to go over all Hospital Tests and Procedure/Radiological results at the follow up, please get all Hospital records sent to your Prim MD by signing hospital release before you go home.   If you experience worsening of your admission symptoms, develop shortness of breath, life threatening emergency, suicidal or homicidal thoughts you must seek medical attention immediately by calling 911 or calling your MD immediately  if symptoms less severe.  You Must read complete instructions/literature along with all the possible adverse reactions/side effects for all the Medicines you take and that have been prescribed to you. Take any new Medicines after you have completely understood and accpet all the possible adverse reactions/side effects.   Do not drive, operating heavy machinery, perform activities at heights, swimming or participation in water activities or provide baby sitting services if your were admitted for syncope or siezures until you have seen by Primary MD or a Neurologist and advised to do so again.  Do not drive when taking Pain medications.    Do not take more than prescribed Pain, Sleep and Anxiety Medications  Special Instructions: If you have smoked or chewed Tobacco  in the last 2 yrs please stop smoking, stop any regular Alcohol  and or any Recreational  drug use.  Wear Seat belts while driving.   Please note  You were cared for by a hospitalist during your hospital stay. If you have any questions about your discharge medications or the care you received while you were in the hospital after you are discharged, you can call the unit and asked to speak with the hospitalist on call if the hospitalist that took care of you is not available. Once you are discharged, your primary care physician will handle any further medical issues. Please note that NO REFILLS for any discharge medications will be authorized once you are discharged, as it is imperative that you return to your primary care physician (or establish a relationship with a primary care physician if you do not have one) for your aftercare needs so that they can reassess your need for medications and monitor your lab values.     Increase activity slowly    Complete by:  As directed             Medication List    STOP taking these medications        allopurinol 300 MG tablet  Commonly known as:  ZYLOPRIM     meloxicam 7.5 MG tablet  Commonly known as:  MOBIC      TAKE these medications        acetaminophen 325 MG tablet  Commonly known as:  TYLENOL  Take 2 tablets (650 mg total) by mouth every 4 (four) hours as needed for headache or mild pain.     aspirin EC 81 MG tablet  Take 1 tablet (81 mg total) by mouth daily. For 1 month.  Then resume taking 4 times a week (tue thur sat sun)     atenolol 25 MG tablet  Commonly known as:  TENORMIN  Take 1 tablet (25 mg total) by mouth 2 (two) times daily.     clindamycin 300 MG capsule  Commonly known as:  CLEOCIN  Take 1 capsule (300 mg total) by mouth 3 (three) times daily.  EPINEPHrine 0.3 mg/0.3 mL Soaj injection  Commonly known as:  EPIPEN 2-PAK  Inject 0.3 mLs (0.3 mg total) into the muscle once.     famotidine 20 MG tablet  Commonly known as:  PEPCID  Take 1 tablet (20 mg total) by mouth 2 (two) times daily.      gabapentin 300 MG capsule  Commonly known as:  NEURONTIN  Take 300 mg by mouth 3 (three) times daily as needed (Patient takes 300mg  by mouth three times daily as needed for nerve pain).     HYDROcodone-homatropine 5-1.5 MG/5ML syrup  Commonly known as:  HYCODAN  Take 5 mLs by mouth every 6 (six) hours as needed for cough.     hydrOXYzine 10 MG tablet  Commonly known as:  ATARAX/VISTARIL  TAKE 1 TABLET BY MOUTH THREE TIMES DAILY AS NEEDED FOR ITCHING     metFORMIN 500 MG 24 hr tablet  Commonly known as:  GLUCOPHAGE-XR  Take 4 tablets (2,000 mg total) by mouth daily with breakfast.     methylPREDNISolone 4 MG Tbpk tablet  Commonly known as:  MEDROL DOSEPAK  follow package directions     nitroGLYCERIN 0.4 MG SL tablet  Commonly known as:  NITROSTAT  Place 1 tablet (0.4 mg total) under the tongue every 5 (five) minutes as needed.     SYNTHROID 150 MCG tablet  Generic drug:  levothyroxine  TAKE 1 TABLET BY MOUTH EVERY DAY     tiZANidine 4 MG tablet  Commonly known as:  ZANAFLEX  Take 1 tablet (4 mg total) by mouth every 6 (six) hours as needed for muscle spasms.     traMADol 50 MG tablet  Commonly known as:  ULTRAM  Take 1 tablet (50 mg total) by mouth every 6 (six) hours as needed for pain.          Diet and Activity recommendation: See Discharge Instructions above   Consults obtained - None   Major procedures and Radiology Reports - PLEASE review detailed and final reports for all details, in brief -      Ct Soft Tissue Neck W Contrast  05/05/2014   CLINICAL DATA:  Facial swelling.  Unable to swallow.  EXAM: CT NECK WITH CONTRAST  TECHNIQUE: Multidetector CT imaging of the neck was performed using the standard protocol following the bolus administration of intravenous contrast.  CONTRAST:  40mL OMNIPAQUE IOHEXOL 300 MG/ML  SOLN  COMPARISON:  CT scan of the neck dated 04/16/2004 and MRI dated 01/03/2005 and CT dated 04/06/2014  FINDINGS: Pharynx and larynx: There is  slight prominence of the soft tissues in the region of the left lingual tonsil but there is no abscess or definable mass. This narrows the airway at the level of the epiglottis.  Salivary glands: The parotid and submandibular glands are normal. There is very subtle soft tissue stranding lateral to both submandibular glands best seen on image 54 of series 7.  Thyroid: The patient has had a thyroidectomy. There is a 13 mm thyroid remnant to the right of midline.  Lymph nodes: No adenopathy.  Vascular: No significant abnormality. Tortuosity of the brachiocephalic vessels.  Limited intracranial: Normal.  Visualized orbits: Normal.  Mastoids and visualized paranasal sinuses: Normal.  Skeleton: Diffuse degenerative disc and joint disease in the cervical spine, unchanged since the prior study of 04/06/2014.  Upper chest: Normal.  IMPRESSION: Very subtle soft tissue stranding in the subcutaneous fat just lateral to both some submandibular glands. The glands themselves appear normal. This is nonspecific.  Improved slight soft tissue asymmetry in the left hypopharynx. This is felt represent slight enlargement of the left lingual tonsil.   Electronically Signed   By: Lorriane Shire M.D.   On: 05/05/2014 16:18    Micro Results      Recent Results (from the past 240 hour(s))  Rapid strep screen     Status: None   Collection Time: 05/05/14  3:56 PM  Result Value Ref Range Status   Streptococcus, Group A Screen (Direct) NEGATIVE NEGATIVE Final    Comment: (NOTE) A Rapid Antigen test may result negative if the antigen level in the sample is below the detection level of this test. The FDA has not cleared this test as a stand-alone test therefore the rapid antigen negative result has reflexed to a Group A Strep culture.        Today   Subjective:   [name] today has no headache,no chest abdominal pain,no new weakness tingling or numbness, feels much better wants to go home today.   Objective:    Blood pressure 105/71, pulse 71, temperature 97.9 F (36.6 C), temperature source Oral, resp. rate 18, height 5\' 6"  (1.676 m), weight 102.649 kg (226 lb 4.8 oz), SpO2 96 %.   Intake/Output Summary (Last 24 hours) at 05/07/14 1102 Last data filed at 05/07/14 0835  Gross per 24 hour  Intake    360 ml  Output      0 ml  Net    360 ml    Exam Awake Alert, Oriented x 3, No new F.N deficits, Normal affect [place].AT,PERRAL Supple Neck,No JVD, No cervical lymphadenopathy appriciated.  Symmetrical Chest wall movement, Good air movement bilaterally, CTAB RRR,No Gallops,Rubs or new Murmurs, No Parasternal Heave +ve B.Sounds, Abd Soft, Non tender, No organomegaly appriciated, No rebound -guarding or rigidity. No Cyanosis, Clubbing or edema, No new Rash or bruise  Data Review   CBC w Diff: Lab Results  Component Value Date   WBC 10.2 05/06/2014   HGB 13.5 05/06/2014   HCT 41.4 05/06/2014   PLT 301 05/06/2014   LYMPHOPCT 28 05/05/2014   MONOPCT 5 05/05/2014   EOSPCT 2 05/05/2014   BASOPCT 0 05/05/2014    CMP: Lab Results  Component Value Date   NA 141 05/06/2014   K 3.7 05/06/2014   CL 105 05/06/2014   CO2 24 05/06/2014   BUN 7 05/06/2014   CREATININE 0.85 05/06/2014   PROT 7.0 05/06/2014   ALBUMIN 3.2* 05/06/2014   BILITOT 0.7 05/06/2014   ALKPHOS 74 05/06/2014   AST 23 05/06/2014   ALT 16 05/06/2014  .   Total Time in preparing paper work, data evaluation and todays exam - 35 minutes  Thurnell Lose M.D on 05/07/2014 at 11:02 AM  [facility]   Office  [phone]

## 2014-05-07 NOTE — Telephone Encounter (Signed)
Transition Care Management Follow-up Telephone Call D/C 05/07/14   How have you been since you were released from the hospital? Pt states she is alright   Do you understand why you were in the hospital? YES   Do you understand the discharge instrcutions? YES  Items Reviewed:  Medications reviewed: YES  Allergies reviewed: YES  Dietary changes reviewed: NO  Referrals reviewed: No referrals needed   Functional Questionnaire:   Activities of Daily Living (ADLs):   She states she are independent in the following: ambulation, bathing and hygiene, feeding, continence, grooming, toileting and dressing She states she doesn't require assistance    Any transportation issues/concerns?: NO   Any patient concerns? NO   Confirmed importance and date/time of follow-up visits scheduled: YES, appt made for 05/08/14 w/Dr. [name]   Confirmed with patient if condition begins to worsen call PCP or go to the ER.

## 2014-05-07 NOTE — Discharge Instructions (Signed)
Follow with Primary MD [name], MD in 7 days   Get CBC, CMP, 2 view Chest X ray checked  by Primary MD next visit.    Activity: As tolerated with Full fall precautions use walker/cane & assistance as needed   Disposition Home     Diet: Heart Healthy  .  For Heart failure patients - Check your Weight same time everyday, if you gain over 2 pounds, or you develop in leg swelling, experience more shortness of breath or chest pain, call your Primary MD immediately. Follow Cardiac Low Salt Diet and 1.5 lit/day fluid restriction.   On your next visit with your primary care physician please Get Medicines reviewed and adjusted.   Please request your Prim.MD to go over all Hospital Tests and Procedure/Radiological results at the follow up, please get all Hospital records sent to your Prim MD by signing hospital release before you go home.   If you experience worsening of your admission symptoms, develop shortness of breath, life threatening emergency, suicidal or homicidal thoughts you must seek medical attention immediately by calling 911 or calling your MD immediately  if symptoms less severe.  You Must read complete instructions/literature along with all the possible adverse reactions/side effects for all the Medicines you take and that have been prescribed to you. Take any new Medicines after you have completely understood and accpet all the possible adverse reactions/side effects.   Do not drive, operating heavy machinery, perform activities at heights, swimming or participation in water activities or provide baby sitting services if your were admitted for syncope or siezures until you have seen by Primary MD or a Neurologist and advised to do so again.  Do not drive when taking Pain medications.    Do not take more than prescribed Pain, Sleep and Anxiety Medications  Special Instructions: If you have smoked or chewed Tobacco  in the last 2 yrs please stop smoking, stop any regular  Alcohol  and or any Recreational drug use.  Wear Seat belts while driving.   Please note  You were cared for by a hospitalist during your hospital stay. If you have any questions about your discharge medications or the care you received while you were in the hospital after you are discharged, you can call the unit and asked to speak with the hospitalist on call if the hospitalist that took care of you is not available. Once you are discharged, your primary care physician will handle any further medical issues. Please note that NO REFILLS for any discharge medications will be authorized once you are discharged, as it is imperative that you return to your primary care physician (or establish a relationship with a primary care physician if you do not have one) for your aftercare needs so that they can reassess your need for medications and monitor your lab values.

## 2014-05-08 ENCOUNTER — Ambulatory Visit (INDEPENDENT_AMBULATORY_CARE_PROVIDER_SITE_OTHER): Payer: Medicare Other | Admitting: Internal Medicine

## 2014-05-08 ENCOUNTER — Encounter: Payer: Self-pay | Admitting: Internal Medicine

## 2014-05-08 VITALS — BP 118/78 | HR 81 | Temp 98.3°F | Resp 18 | Ht 66.0 in | Wt 224.6 lb

## 2014-05-08 DIAGNOSIS — I2510 Atherosclerotic heart disease of native coronary artery without angina pectoris: Secondary | ICD-10-CM

## 2014-05-08 DIAGNOSIS — R350 Frequency of micturition: Secondary | ICD-10-CM | POA: Diagnosis not present

## 2014-05-08 DIAGNOSIS — I10 Essential (primary) hypertension: Secondary | ICD-10-CM | POA: Diagnosis not present

## 2014-05-08 DIAGNOSIS — M1040 Other secondary gout, unspecified site: Secondary | ICD-10-CM | POA: Diagnosis not present

## 2014-05-08 DIAGNOSIS — T783XXD Angioneurotic edema, subsequent encounter: Secondary | ICD-10-CM

## 2014-05-08 HISTORY — DX: Frequency of micturition: R35.0

## 2014-05-08 LAB — RESPIRATORY VIRUS PANEL
ADENOVIRUS: NEGATIVE
Influenza A: NEGATIVE
Influenza B: NEGATIVE
METAPNEUMOVIRUS: NEGATIVE
PARAINFLUENZA 1 A: NEGATIVE
PARAINFLUENZA 2 A: NEGATIVE
Parainfluenza 3: NEGATIVE
RESPIRATORY SYNCYTIAL VIRUS A: NEGATIVE
Respiratory Syncytial Virus B: NEGATIVE
Rhinovirus: NEGATIVE

## 2014-05-08 MED ORDER — KETOROLAC TROMETHAMINE 30 MG/ML IJ SOLN
30.0000 mg | Freq: Once | INTRAMUSCULAR | Status: AC
  Administered 2014-05-08: 30 mg via INTRAMUSCULAR

## 2014-05-08 MED ORDER — METHYLPREDNISOLONE ACETATE 80 MG/ML IJ SUSP
120.0000 mg | Freq: Once | INTRAMUSCULAR | Status: AC
  Administered 2014-05-08: 120 mg via INTRAMUSCULAR

## 2014-05-08 NOTE — Progress Notes (Signed)
Subjective:    Patient ID: [name], female    DOB: 06-Jun-1945, 69 y.o.   MRN: [id number]  HPI  Here to f/u, c/o sinus congestion/ST and right ear discomfort ongoing, as well as right lateral heel/lateral foot pain/swelling/heat after allopurinol/nsiad stopped, some better with IV steroid for angioedema, now worse again.  Has no money, has not started her d/c medrol pack.  Has documented referral to allergist upcoming, Dr Verlin Fester. No further tongue swelling, etiology not clear.  Pt denies chest pain, increased sob or doe, wheezing, orthopnea, PND, increased LE swelling, palpitations, dizziness or syncope. ALso with some urinary freq but Denies urinary symptoms such as dysuria,  urgency, flank pain, hematuria or n/v, fever, chills., UA dip in office negative Past Medical History  Diagnosis Date  . Coronary artery disease     a. s/p inferior MI 1998->PCI of RCA. b. S/p prior PCI of the OM1 and LAD;  c. 05/2013 Cath/PCI: LM nl, LAD 90p (3.0x22 Resolute DES), 70m w/ patent stent, Diag 40p, 55m, LCX patent OM1 stent, RCA 50p, 73m, EF 65%.  . Hyperlipidemia   . Hypertension   . Obesity, unspecified   . Hypothyroidism   . Diabetes mellitus     TYPE II  . Anxiety state, unspecified   . Depressive disorder, not elsewhere classified   . Degeneration of lumbar or lumbosacral intervertebral disc   . Allergic rhinitis, cause unspecified   . Lower GI bleed 06/2010    Diverticular bleed  . Chronic LBP   . AAA (abdominal aortic aneurysm)     a. 4.7cm in 08/2012. Followed by [facility].  . Gout 08/22/2013  . Noncompliance with medications 02/26/2014   Past Surgical History  Procedure Laterality Date  . Total abdominal hysterectomy    . Lumbar fusion  01/2007    DR. Patrice Paradise...3-LEVEL WITH FIXATION  . Parathyroidectomy    . Thyroidectomy    . Cardiac catheterization      PCI OF BOTH THE CIRCUMFLEX AND LEFT ANTERIOR DESCENDING ARTERY  . Cesarean section    . Heart stent  04-2010  and  Jun 07, 2013    X 3  .  Spine surgery    . Left heart catheterization with coronary angiogram N/A 06/07/2013    Procedure: LEFT HEART CATHETERIZATION WITH CORONARY ANGIOGRAM;  Surgeon: [name], MD;  Location: [facility] CATH LAB;  Service: Cardiovascular;  Laterality: N/A;  . Left heart catheterization with coronary angiogram N/A 02/25/2014    Procedure: LEFT HEART CATHETERIZATION WITH CORONARY ANGIOGRAM;  Surgeon: Troy Sine, MD;  Location: [facility] CATH LAB;  Service: Cardiovascular;  Laterality: N/A;    reports that she quit smoking about 27 years ago. Her smoking use included Cigarettes. She quit after 30 years of use. She has never used smokeless tobacco. She reports that she does not drink alcohol or use illicit drugs. family history includes Arthritis in her maternal aunt; Cancer in her maternal aunt; Diabetes in her brother and maternal aunt; Heart attack (age of onset: 21) in her mother; Heart attack (age of onset: 59) in her father; Heart disease in her father and mother. There is no history of Colon cancer. Allergies  Allergen Reactions  . Prilosec [Omeprazole] Other (See Comments)    Chest pain  . Miconazole Nitrate Hives    REACTION: hives  . Prednisone Swelling    Tongue/lip swelling.  . Augmentin [Amoxicillin-Pot Clavulanate] Hives, Itching and Rash  . Doxycycline Other (See Comments)    REACTION: gi upset  Current Outpatient Prescriptions on File Prior to Visit  Medication Sig Dispense Refill  . acetaminophen (TYLENOL) 325 MG tablet Take 2 tablets (650 mg total) by mouth every 4 (four) hours as needed for headache or mild pain.    [name] aspirin EC 81 MG tablet Take 1 tablet (81 mg total) by mouth daily. For 1 month.  Then resume taking 4 times a week (tue thur sat sun) (Patient taking differently: Take 81 mg by mouth as directed. 4 times a week (tue thur sat sun))    . atenolol (TENORMIN) 25 MG tablet Take 1 tablet (25 mg total) by mouth 2 (two) times daily. 180 tablet 3  . clindamycin (CLEOCIN) 300  MG capsule Take 1 capsule (300 mg total) by mouth 3 (three) times daily. 30 capsule 0  . EPINEPHrine (EPIPEN 2-PAK) 0.3 mg/0.3 mL IJ SOAJ injection Inject 0.3 mLs (0.3 mg total) into the muscle once. 1 Device 1  . famotidine (PEPCID) 20 MG tablet Take 1 tablet (20 mg total) by mouth 2 (two) times daily. 60 tablet 0  . gabapentin (NEURONTIN) 300 MG capsule Take 300 mg by mouth 3 (three) times daily as needed (Patient takes 300mg  by mouth three times daily as needed for nerve pain).    [name] HYDROcodone-homatropine (HYCODAN) 5-1.5 MG/5ML syrup Take 5 mLs by mouth every 6 (six) hours as needed for cough. 180 mL 0  . hydrOXYzine (ATARAX/VISTARIL) 10 MG tablet TAKE 1 TABLET BY MOUTH THREE TIMES DAILY AS NEEDED FOR ITCHING 90 tablet 0  . metFORMIN (GLUCOPHAGE-XR) 500 MG 24 hr tablet Take 4 tablets (2,000 mg total) by mouth daily with breakfast.    . methylPREDNISolone (MEDROL DOSEPAK) 4 MG TBPK tablet follow package directions 21 tablet 0  . nitroGLYCERIN (NITROSTAT) 0.4 MG SL tablet Place 1 tablet (0.4 mg total) under the tongue every 5 (five) minutes as needed. (Patient taking differently: Place 0.4 mg under the tongue every 5 (five) minutes as needed for chest pain. ) 25 tablet 1  . SYNTHROID 150 MCG tablet TAKE 1 TABLET BY MOUTH EVERY DAY 90 tablet 3  . tiZANidine (ZANAFLEX) 4 MG tablet Take 1 tablet (4 mg total) by mouth every 6 (six) hours as needed for muscle spasms. 40 tablet 1  . traMADol (ULTRAM) 50 MG tablet Take 1 tablet (50 mg total) by mouth every 6 (six) hours as needed for pain. 60 tablet 1   No current facility-administered medications on file prior to visit.   Review of Systems  Constitutional: Negative for unusual diaphoresis or night sweats HENT: Negative for ringing in ear or discharge Eyes: Negative for double vision or worsening visual disturbance.  Respiratory: Negative for choking and stridor.   Gastrointestinal: Negative for vomiting or other signifcant bowel  change Genitourinary: Negative for hematuria or change in urine volume.  Musculoskeletal: Negative for other MSK pain or swelling Skin: Negative for color change and worsening wound.  Neurological: Negative for tremors and numbness other than noted  Psychiatric/Behavioral: Negative for decreased concentration or agitation other than above       Objective:   Physical Exam BP 118/78 mmHg  Pulse 81  Temp(Src) 98.3 F (36.8 C) (Oral)  Resp 18  Ht 5\' 6"  (1.676 m)  Wt 224 lb 9.6 oz (101.878 kg)  BMI 36.27 kg/m2  SpO2 94% VS noted,  Constitutional: Pt appears in no significant distress HENT: Head: NCAT.  Right Ear: External ear normal.  Left Ear: External ear normal.  Eyes: . Pupils are equal, round,  and reactive to light. Conjunctivae and EOM are normal Neck: Normal range of motion. Neck supple.  Cardiovascular: Normal rate and regular rhythm.   Pulmonary/Chest: Effort normal and breath sounds without rales or wheezing.  Abd:  Soft, NT, ND, + BS Neurological: Pt is alert. Not confused , motor grossly intact Skin: Skin is warm. No rash, no LE edema Psychiatric: Pt behavior is normal. No agitation.  Right heel and lateral foot with 1+ sweling/heat/erythema without red streaks or drainage    Assessment & Plan:

## 2014-05-08 NOTE — Assessment & Plan Note (Signed)
Exam benign, afeb, ok to follow

## 2014-05-08 NOTE — Patient Instructions (Addendum)
The urine specimen testing was OK  You had the steroid shot today, and the pain shot (toradol)  Please continue all other medications as before, and refills have been done if requested.  Please have the pharmacy call with any other refills you may need.  Please keep your appointments with your specialists as you may have planned - allergy

## 2014-05-08 NOTE — Assessment & Plan Note (Signed)
stable overall by history and exam, recent data reviewed with pt, and pt to continue medical treatment as before,  to f/u any worsening symptoms or concerns BP Readings from Last 3 Encounters:  05/08/14 118/78  05/05/14 110/68  04/10/14 108/60

## 2014-05-08 NOTE — Assessment & Plan Note (Signed)
Unable to afford the medrol pack today, ok for depomedrol IM,  to f/u any worsening symptoms or concerns, f/u allergy referral, has epipen rx as well

## 2014-05-08 NOTE — Assessment & Plan Note (Signed)
Exam somewhat c/w right lateral ankle sprain, not clear if gout but pt states some improved while hospd with IV steroid, for tx as above, also toradol IM today

## 2014-05-28 DIAGNOSIS — T783XXA Angioneurotic edema, initial encounter: Secondary | ICD-10-CM | POA: Diagnosis not present

## 2014-05-28 DIAGNOSIS — L501 Idiopathic urticaria: Secondary | ICD-10-CM | POA: Diagnosis not present

## 2014-05-28 DIAGNOSIS — H1045 Other chronic allergic conjunctivitis: Secondary | ICD-10-CM | POA: Diagnosis not present

## 2014-05-28 DIAGNOSIS — J309 Allergic rhinitis, unspecified: Secondary | ICD-10-CM | POA: Diagnosis not present

## 2014-06-03 ENCOUNTER — Ambulatory Visit (INDEPENDENT_AMBULATORY_CARE_PROVIDER_SITE_OTHER): Payer: Medicare Other | Admitting: Internal Medicine

## 2014-06-03 ENCOUNTER — Encounter: Payer: Self-pay | Admitting: Internal Medicine

## 2014-06-03 VITALS — BP 114/72 | HR 90 | Temp 97.6°F | Wt 220.0 lb

## 2014-06-03 DIAGNOSIS — I2510 Atherosclerotic heart disease of native coronary artery without angina pectoris: Secondary | ICD-10-CM | POA: Diagnosis not present

## 2014-06-03 DIAGNOSIS — M545 Low back pain, unspecified: Secondary | ICD-10-CM

## 2014-06-03 DIAGNOSIS — G8929 Other chronic pain: Secondary | ICD-10-CM | POA: Diagnosis not present

## 2014-06-03 DIAGNOSIS — T783XXA Angioneurotic edema, initial encounter: Secondary | ICD-10-CM | POA: Diagnosis not present

## 2014-06-03 DIAGNOSIS — I10 Essential (primary) hypertension: Secondary | ICD-10-CM

## 2014-06-03 DIAGNOSIS — J069 Acute upper respiratory infection, unspecified: Secondary | ICD-10-CM

## 2014-06-03 HISTORY — DX: Angioneurotic edema, initial encounter: T78.3XXA

## 2014-06-03 MED ORDER — CLARITHROMYCIN 250 MG PO TABS: 250.0000 mg | ORAL_TABLET | Freq: Two times a day (BID) | ORAL | Status: DC

## 2014-06-03 MED ORDER — METHYLPREDNISOLONE ACETATE 80 MG/ML IJ SUSP
80.0000 mg | Freq: Once | INTRAMUSCULAR | Status: AC
  Administered 2014-06-03: 80 mg via INTRAMUSCULAR

## 2014-06-03 MED ORDER — KETOROLAC TROMETHAMINE 30 MG/ML IJ SOLN
30.0000 mg | Freq: Once | INTRAMUSCULAR | Status: AC
  Administered 2014-06-03: 30 mg via INTRAMUSCULAR

## 2014-06-03 MED ORDER — METHYLPREDNISOLONE 4 MG PO TBPK: ORAL_TABLET | ORAL | Status: DC

## 2014-06-03 NOTE — Patient Instructions (Signed)
You had the steroid shot, and the pain shot (toradol) today  Please take all new medication as prescribed - the antibiotic, and prednisone  Please continue all other medications as before, and refills have been done if requested.  Please have the pharmacy call with any other refills you may need.  Please continue your efforts at being more active, low cholesterol diet, and weight control.  Please keep your appointments with your specialists as you may have planned

## 2014-06-03 NOTE — Progress Notes (Signed)
Pre visit review using our clinic review tool, if applicable. No additional management support is needed unless otherwise documented below in the visit note. 

## 2014-06-03 NOTE — Progress Notes (Signed)
Subjective:    Patient ID: [name], female    DOB: 1945-12-07, 69 y.o.   MRN: [id number]  HPI   Here with 2-3 days acute onset fever, facial pain, pressure, headache, general weakness and malaise, and greenish d/c, with mild ST and cough, but pt denies chest pain, wheezing, increased sob or doe, orthopnea, PND, increased LE swelling, palpitations, dizziness or syncope.  Also with 2-3 days left lower lip nonpainful swelling.  Pt continues to have acute right LBP without change in severity, bowel or bladder change, fever, wt loss,  worsening LE numbness/weakness, gait change or falls, but did have some radiation to the right buttock.  Denies urinary symptoms such as dysuria, frequency, urgency, flank pain, hematuria or n/v, fever, chills. Past Medical History  Diagnosis Date  . Coronary artery disease     a. s/p inferior MI 1998->PCI of RCA. b. S/p prior PCI of the OM1 and LAD;  c. 05/2013 Cath/PCI: LM nl, LAD 90p (3.0x22 Resolute DES), 16m w/ patent stent, Diag 40p, 32m, LCX patent OM1 stent, RCA 50p, 56m, EF 65%.  . Hyperlipidemia   . Hypertension   . Obesity, unspecified   . Hypothyroidism   . Diabetes mellitus     TYPE II  . Anxiety state, unspecified   . Depressive disorder, not elsewhere classified   . Degeneration of lumbar or lumbosacral intervertebral disc   . Allergic rhinitis, cause unspecified   . Lower GI bleed 06/2010    Diverticular bleed  . Chronic LBP   . AAA (abdominal aortic aneurysm)     a. 4.7cm in 08/2012. Followed by [facility].  . Gout 08/22/2013  . Noncompliance with medications 02/26/2014   Past Surgical History  Procedure Laterality Date  . Total abdominal hysterectomy    . Lumbar fusion  01/2007    DR. Patrice Paradise...3-LEVEL WITH FIXATION  . Parathyroidectomy    . Thyroidectomy    . Cardiac catheterization      PCI OF BOTH THE CIRCUMFLEX AND LEFT ANTERIOR DESCENDING ARTERY  . Cesarean section    . Heart stent  04-2010  and  Jun 07, 2013    X 3  . Spine surgery    .  Left heart catheterization with coronary angiogram N/A 06/07/2013    Procedure: LEFT HEART CATHETERIZATION WITH CORONARY ANGIOGRAM;  Surgeon: [name], MD;  Location: [facility] CATH LAB;  Service: Cardiovascular;  Laterality: N/A;  . Left heart catheterization with coronary angiogram N/A 02/25/2014    Procedure: LEFT HEART CATHETERIZATION WITH CORONARY ANGIOGRAM;  Surgeon: Troy Sine, MD;  Location: [facility] CATH LAB;  Service: Cardiovascular;  Laterality: N/A;    reports that she quit smoking about 28 years ago. Her smoking use included Cigarettes. She quit after 30 years of use. She has never used smokeless tobacco. She reports that she does not drink alcohol or use illicit drugs. family history includes Arthritis in her maternal aunt; Cancer in her maternal aunt; Diabetes in her brother and maternal aunt; Heart attack (age of onset: 67) in her mother; Heart attack (age of onset: 29) in her father; Heart disease in her father and mother. There is no history of Colon cancer. Allergies  Allergen Reactions  . Prilosec [Omeprazole] Other (See Comments)    Chest pain  . Miconazole Nitrate Hives    REACTION: hives  . Prednisone Swelling    Tongue/lip swelling.  . Augmentin [Amoxicillin-Pot Clavulanate] Hives, Itching and Rash  . Doxycycline Other (See Comments)    REACTION: gi  upset   Current Outpatient Prescriptions on File Prior to Visit  Medication Sig Dispense Refill  . acetaminophen (TYLENOL) 325 MG tablet Take 2 tablets (650 mg total) by mouth every 4 (four) hours as needed for headache or mild pain.    [name] aspirin EC 81 MG tablet Take 1 tablet (81 mg total) by mouth daily. For 1 month.  Then resume taking 4 times a week (tue thur sat sun) (Patient taking differently: Take 81 mg by mouth as directed. 4 times a week (tue thur sat sun))    . atenolol (TENORMIN) 25 MG tablet Take 1 tablet (25 mg total) by mouth 2 (two) times daily. 180 tablet 3  . clindamycin (CLEOCIN) 300 MG capsule Take 1  capsule (300 mg total) by mouth 3 (three) times daily. 30 capsule 0  . EPINEPHrine (EPIPEN 2-PAK) 0.3 mg/0.3 mL IJ SOAJ injection Inject 0.3 mLs (0.3 mg total) into the muscle once. 1 Device 1  . famotidine (PEPCID) 20 MG tablet Take 1 tablet (20 mg total) by mouth 2 (two) times daily. 60 tablet 0  . gabapentin (NEURONTIN) 300 MG capsule Take 300 mg by mouth 3 (three) times daily as needed (Patient takes 300mg  by mouth three times daily as needed for nerve pain).    [name] HYDROcodone-homatropine (HYCODAN) 5-1.5 MG/5ML syrup Take 5 mLs by mouth every 6 (six) hours as needed for cough. 180 mL 0  . hydrOXYzine (ATARAX/VISTARIL) 10 MG tablet TAKE 1 TABLET BY MOUTH THREE TIMES DAILY AS NEEDED FOR ITCHING 90 tablet 0  . metFORMIN (GLUCOPHAGE-XR) 500 MG 24 hr tablet Take 4 tablets (2,000 mg total) by mouth daily with breakfast.    . nitroGLYCERIN (NITROSTAT) 0.4 MG SL tablet Place 1 tablet (0.4 mg total) under the tongue every 5 (five) minutes as needed. (Patient taking differently: Place 0.4 mg under the tongue every 5 (five) minutes as needed for chest pain. ) 25 tablet 1  . SYNTHROID 150 MCG tablet TAKE 1 TABLET BY MOUTH EVERY DAY 90 tablet 3  . tiZANidine (ZANAFLEX) 4 MG tablet Take 1 tablet (4 mg total) by mouth every 6 (six) hours as needed for muscle spasms. 40 tablet 1  . traMADol (ULTRAM) 50 MG tablet Take 1 tablet (50 mg total) by mouth every 6 (six) hours as needed for pain. 60 tablet 1   No current facility-administered medications on file prior to visit.   Review of Systems  Constitutional: Negative for unusual diaphoresis or night sweats HENT: Negative for ringing in ear or discharge Eyes: Negative for double vision or worsening visual disturbance.  Respiratory: Negative for choking and stridor.   Gastrointestinal: Negative for vomiting or other signifcant bowel change Genitourinary: Negative for hematuria or change in urine volume.  Musculoskeletal: Negative for other MSK pain or  swelling Skin: Negative for color change and worsening wound.  Neurological: Negative for tremors and numbness other than noted  Psychiatric/Behavioral: Negative for decreased concentration or agitation other than above       Objective:   Physical Exam BP 114/72 mmHg  Pulse 90  Temp(Src) 97.6 F (36.4 C) (Oral)  Wt 220 lb 0.6 oz (99.809 kg)  SpO2 96% VS noted, mild ill Constitutional: Pt appears in no significant distress HENT: Head: NCAT.  Right Ear: External ear normal.  Left Ear: External ear normal.  Bilat tm's with mild erythema.  Max sinus areas mild tender.  Pharynx with mild erythema, no exudate Eyes: . Pupils are equal, round, and reactive to light. Conjunctivae and  EOM are normal Neck: Normal range of motion. Neck supple.  Cardiovascular: Normal rate and regular rhythm.   Pulmonary/Chest: Effort normal and breath sounds without rales or wheezing.  Abd:  Soft, NT, + BS Spine nontender, mild right lateral lumbar paravertebral tender Neurological: Pt is alert. Not confused , motor grossly intact Skin: Skin is warm. No rash, no LE edema Psychiatric: Pt behavior is normal. No agitation.     Assessment & Plan:

## 2014-06-05 DIAGNOSIS — R109 Unspecified abdominal pain: Secondary | ICD-10-CM | POA: Diagnosis not present

## 2014-06-05 DIAGNOSIS — T783XXA Angioneurotic edema, initial encounter: Secondary | ICD-10-CM | POA: Diagnosis not present

## 2014-06-05 DIAGNOSIS — L501 Idiopathic urticaria: Secondary | ICD-10-CM | POA: Diagnosis not present

## 2014-06-05 DIAGNOSIS — T7800XA Anaphylactic reaction due to unspecified food, initial encounter: Secondary | ICD-10-CM | POA: Diagnosis not present

## 2014-06-05 DIAGNOSIS — T782XXA Anaphylactic shock, unspecified, initial encounter: Secondary | ICD-10-CM | POA: Diagnosis not present

## 2014-06-05 DIAGNOSIS — R5382 Chronic fatigue, unspecified: Secondary | ICD-10-CM | POA: Diagnosis not present

## 2014-06-09 DIAGNOSIS — J22 Unspecified acute lower respiratory infection: Secondary | ICD-10-CM

## 2014-06-09 HISTORY — DX: Unspecified acute lower respiratory infection: J22

## 2014-06-09 NOTE — Assessment & Plan Note (Signed)
stable overall by history and exam, recent data reviewed with pt, and pt to continue medical treatment as before,  to f/u any worsening symptoms or concerns BP Readings from Last 3 Encounters:  06/03/14 114/72  05/08/14 118/78  05/05/14 110/68

## 2014-06-09 NOTE — Assessment & Plan Note (Addendum)
Mild to mod flare acute, for toradol IM,  to f/u any worsening symptoms or concerns, no neuro change noted today

## 2014-06-09 NOTE — Assessment & Plan Note (Signed)
Mild to mod, for depomedrol IM, predpac asd, to f/u any worsening symptoms or concerns 

## 2014-06-09 NOTE — Assessment & Plan Note (Signed)
Mild to mod, for antibx course,  to f/u any worsening symptoms or concerns 

## 2014-06-25 DIAGNOSIS — T783XXA Angioneurotic edema, initial encounter: Secondary | ICD-10-CM | POA: Diagnosis not present

## 2014-06-25 DIAGNOSIS — J309 Allergic rhinitis, unspecified: Secondary | ICD-10-CM | POA: Diagnosis not present

## 2014-07-03 ENCOUNTER — Other Ambulatory Visit (INDEPENDENT_AMBULATORY_CARE_PROVIDER_SITE_OTHER): Payer: Medicare Other | Admitting: *Deleted

## 2014-07-03 DIAGNOSIS — E785 Hyperlipidemia, unspecified: Secondary | ICD-10-CM

## 2014-07-03 DIAGNOSIS — I10 Essential (primary) hypertension: Secondary | ICD-10-CM | POA: Diagnosis not present

## 2014-07-03 LAB — LIPID PANEL
CHOL/HDL RATIO: 5
Cholesterol: 171 mg/dL (ref 0–200)
HDL: 35.3 mg/dL — AB (ref >39.00)
LDL CALC: 103 mg/dL — AB (ref 0–99)
NONHDL: 135.7
Triglycerides: 166 mg/dL — ABNORMAL HIGH (ref 0.0–149.0)
VLDL: 33.2 mg/dL (ref 0.0–40.0)

## 2014-07-03 LAB — HEPATIC FUNCTION PANEL
ALT: 21 U/L (ref 0–35)
AST: 20 U/L (ref 0–37)
Albumin: 3.8 g/dL (ref 3.5–5.2)
Alkaline Phosphatase: 77 U/L (ref 39–117)
BILIRUBIN DIRECT: 0.1 mg/dL (ref 0.0–0.3)
BILIRUBIN TOTAL: 0.5 mg/dL (ref 0.2–1.2)
Total Protein: 7.3 g/dL (ref 6.0–8.3)

## 2014-07-11 ENCOUNTER — Other Ambulatory Visit: Payer: Self-pay | Admitting: Internal Medicine

## 2014-07-11 ENCOUNTER — Encounter: Payer: Self-pay | Admitting: Family

## 2014-07-14 ENCOUNTER — Other Ambulatory Visit: Payer: Self-pay

## 2014-07-15 ENCOUNTER — Ambulatory Visit (INDEPENDENT_AMBULATORY_CARE_PROVIDER_SITE_OTHER): Payer: Medicare Other | Admitting: Internal Medicine

## 2014-07-15 ENCOUNTER — Ambulatory Visit (HOSPITAL_COMMUNITY)
Admission: RE | Admit: 2014-07-15 | Discharge: 2014-07-15 | Disposition: A | Discharge status: Home / Self Care | Payer: Medicare Other | Source: Ambulatory Visit | Attending: Family | Admitting: Family

## 2014-07-15 ENCOUNTER — Encounter: Payer: Self-pay | Admitting: Family

## 2014-07-15 ENCOUNTER — Ambulatory Visit: Payer: Medicare Other | Admitting: Family

## 2014-07-15 ENCOUNTER — Encounter: Payer: Self-pay | Admitting: Internal Medicine

## 2014-07-15 ENCOUNTER — Ambulatory Visit (INDEPENDENT_AMBULATORY_CARE_PROVIDER_SITE_OTHER): Payer: Medicare Other | Admitting: Family

## 2014-07-15 VITALS — BP 112/84 | HR 86 | Temp 97.8°F | Ht 66.0 in | Wt 220.0 lb

## 2014-07-15 VITALS — BP 99/70 | HR 69 | Resp 16 | Ht 66.0 in | Wt 220.0 lb

## 2014-07-15 DIAGNOSIS — M25561 Pain in right knee: Secondary | ICD-10-CM

## 2014-07-15 DIAGNOSIS — Z720 Tobacco use: Secondary | ICD-10-CM

## 2014-07-15 DIAGNOSIS — I10 Essential (primary) hypertension: Secondary | ICD-10-CM | POA: Diagnosis not present

## 2014-07-15 DIAGNOSIS — I714 Abdominal aortic aneurysm, without rupture, unspecified: Secondary | ICD-10-CM

## 2014-07-15 DIAGNOSIS — G8929 Other chronic pain: Secondary | ICD-10-CM

## 2014-07-15 DIAGNOSIS — F17200 Nicotine dependence, unspecified, uncomplicated: Secondary | ICD-10-CM

## 2014-07-15 DIAGNOSIS — M545 Low back pain, unspecified: Secondary | ICD-10-CM

## 2014-07-15 DIAGNOSIS — I2510 Atherosclerotic heart disease of native coronary artery without angina pectoris: Secondary | ICD-10-CM

## 2014-07-15 MED ORDER — OXYCODONE-ACETAMINOPHEN 5-325 MG PO TABS: 1.0000 | ORAL_TABLET | Freq: Four times a day (QID) | ORAL | Status: DC | PRN

## 2014-07-15 MED ORDER — KETOROLAC TROMETHAMINE 30 MG/ML IM SOLN
30.0000 mg | Freq: Once | INTRAMUSCULAR | Status: AC
  Administered 2014-07-15: 30 mg via INTRAMUSCULAR

## 2014-07-15 MED ORDER — DICLOFENAC SODIUM 1 % TD GEL: 4.0000 g | Freq: Four times a day (QID) | TRANSDERMAL | Status: DC | PRN

## 2014-07-15 MED ORDER — KETOROLAC TROMETHAMINE 30 MG/ML IJ SOLN: 30.0000 mg | Freq: Once | INTRAMUSCULAR | Status: DC

## 2014-07-15 NOTE — Assessment & Plan Note (Addendum)
Likely djd, with worsening pain/function, to sport med, also for voltaren gel prn

## 2014-07-15 NOTE — Patient Instructions (Signed)
You had the pain shot today (toradol)  Please take all new medication as prescribed - the oxycodone, and voltaren gel as needed  Please continue all other medications as before  Please have the pharmacy call with any other refills you may need.  You will be contacted regarding the referral for: Dr Patrice Paradise for the back, and Dr Tamala Julian for the right knee  Please keep your appointments with your other specialists as you may have planned

## 2014-07-15 NOTE — Progress Notes (Signed)
[facility] [place]  Established Abdominal Aortic Aneurysm  History of Present Illness  [name] is a 69 y.o. (January 14, 1946) female patient of Dr. Donnetta Hutching. The patient presents today for continued followup of known infrarenal abdominal aortic aneurysm.  She does have some chronic degenerative disc disease with back pain, with recent exacerbation, relieved by sitting, but no new abdominal pain.  Will be seeing her PCP, Dr. [name], at 4 pm re her sudden onset right knee pain about 3 days ago, denies injury.  Dr. Rennis Harding is her spine specialist whom pt states recommended back surgery.  Previous studies demonstrate an AAA, measuring 4.7 cm.  The patient denies claudication in legs with walking, denies non healing wounds. The patient denies history of stroke or TIA symptoms. She had a cardiac stent placed February 2016. Takes ASA 4 days/week. Her cardiologist is Dr. Burt Knack, pt states she tried Brilinta as prescribed by Dr. Burt Knack, also tried Plavix, states both medications "were messin' with me" and pt stopped taking, pt states she told the cardiologist office that she stopped Brilinta.   Pt states her cholesterol is good, not taking a statin.  Pt Diabetic: Yes, A1C August 2015 was 7.0, pt does not know what her more recent A1C was Pt smoker: 1/4 to 1/3 ppd smoker for many years   Past Medical History  Diagnosis Date  . Coronary artery disease     a. s/p inferior MI 1998->PCI of RCA. b. S/p prior PCI of the OM1 and LAD;  c. 05/2013 Cath/PCI: LM nl, LAD 90p (3.0x22 Resolute DES), 95m w/ patent stent, Diag 40p, 84m, LCX patent OM1 stent, RCA 50p, 81m, EF 65%.  . Hyperlipidemia   . Hypertension   . Obesity, unspecified   . Hypothyroidism   . Diabetes mellitus     TYPE II  . Anxiety state, unspecified   . Depressive disorder, not elsewhere classified   . Degeneration of lumbar or lumbosacral intervertebral disc   . Allergic rhinitis, cause unspecified    . Lower GI bleed 06/2010    Diverticular bleed  . Chronic LBP   . AAA (abdominal aortic aneurysm)     a. 4.7cm in 08/2012. Followed by [facility].  . Gout 08/22/2013  . Noncompliance with medications 02/26/2014   Past Surgical History  Procedure Laterality Date  . Total abdominal hysterectomy    . Lumbar fusion  01/2007    DR. Patrice Paradise...3-LEVEL WITH FIXATION  . Parathyroidectomy    . Thyroidectomy    . Cardiac catheterization      PCI OF BOTH THE CIRCUMFLEX AND LEFT ANTERIOR DESCENDING ARTERY  . Cesarean section    . Heart stent  04-2010  and  Jun 07, 2013    X 3  . Spine surgery    . Left heart catheterization with coronary angiogram N/A 06/07/2013    Procedure: LEFT HEART CATHETERIZATION WITH CORONARY ANGIOGRAM;  Surgeon: [name], MD;  Location: [facility] CATH LAB;  Service: Cardiovascular;  Laterality: N/A;  . Left heart catheterization with coronary angiogram N/A 02/25/2014    Procedure: LEFT HEART CATHETERIZATION WITH CORONARY ANGIOGRAM;  Surgeon: Troy Sine, MD;  Location: [facility] CATH LAB;  Service: Cardiovascular;  Laterality: N/A;   Social History History   Social History  . Marital Status: Divorced    Spouse Name: N/A  . Number of Children: 3  . Years of Education: N/A   Occupational History  . [employer]  .     Social History Main Topics  . Smoking status: Former Smoker -- 30 years    Types: Cigarettes    Quit date: 05/31/1986  . Smokeless tobacco: Never Used  . Alcohol Use: No  . Drug Use: No  . Sexual Activity: Not on file   Other Topics Concern  . Not on file   Social History Narrative   DIVORCED   3 CHILDREN   PATIENT SIGNED A DESIGNATED PARTY RELEASE TO ALLOW HER DAUGHTER, TRAMAINE Ferriss, TO HAVE ACCESS TO HER MEDICAL RECORDS/INFORMATION. [name], May 04, 2009 @ 3:27 PM   Smokes cigarettes on rare occasions.   Family History Family History  Problem Relation Age of Onset  . Heart attack Mother 44    s/p  D&C-CARDIAC ARREST 1966  . Heart disease Mother   . Heart attack Father 46    1978 WITH MI  . Heart disease Father   . Colon cancer Neg Hx   . Diabetes Brother   . Diabetes Maternal Aunt   . Arthritis Maternal Aunt   . Cancer Maternal Aunt     Current Outpatient Prescriptions on File Prior to Visit  Medication Sig Dispense Refill  . acetaminophen (TYLENOL) 325 MG tablet Take 2 tablets (650 mg total) by mouth every 4 (four) hours as needed for headache or mild pain.    [name] aspirin EC 81 MG tablet Take 1 tablet (81 mg total) by mouth daily. For 1 month.  Then resume taking 4 times a week (tue thur sat sun) (Patient taking differently: Take 81 mg by mouth as directed. 4 times a week (tue thur sat sun))    . atenolol (TENORMIN) 25 MG tablet Take 1 tablet (25 mg total) by mouth 2 (two) times daily. 180 tablet 3  . clarithromycin (BIAXIN) 250 MG tablet Take 1 tablet (250 mg total) by mouth 2 (two) times daily. 20 tablet 0  . clindamycin (CLEOCIN) 300 MG capsule Take 1 capsule (300 mg total) by mouth 3 (three) times daily. 30 capsule 0  . EPINEPHrine (EPIPEN 2-PAK) 0.3 mg/0.3 mL IJ SOAJ injection Inject 0.3 mLs (0.3 mg total) into the muscle once. 1 Device 1  . famotidine (PEPCID) 20 MG tablet Take 1 tablet (20 mg total) by mouth 2 (two) times daily. 60 tablet 0  . gabapentin (NEURONTIN) 300 MG capsule Take 300 mg by mouth 3 (three) times daily as needed (Patient takes 300mg  by mouth three times daily as needed for nerve pain).    [name] HYDROcodone-homatropine (HYCODAN) 5-1.5 MG/5ML syrup Take 5 mLs by mouth every 6 (six) hours as needed for cough. 180 mL 0  . hydrOXYzine (ATARAX/VISTARIL) 10 MG tablet TAKE 1 TABLET BY MOUTH THREE TIMES DAILY AS NEEDED FOR ITCHING 90 tablet 0  . metFORMIN (GLUCOPHAGE-XR) 500 MG 24 hr tablet Take 4 tablets (2,000 mg total) by mouth daily with breakfast.    . metFORMIN (GLUCOPHAGE-XR) 500 MG 24 hr tablet TAKE 4 TABLETS BY MOUTH EVERY MORNING 360 tablet 0  .  methylPREDNISolone (MEDROL DOSEPAK) 4 MG TBPK tablet follow package directions 21 tablet 0  . nitroGLYCERIN (NITROSTAT) 0.4 MG SL tablet Place 1 tablet (0.4 mg total) under the tongue every 5 (five) minutes as needed. (Patient taking differently: Place 0.4 mg under the tongue every 5 (five) minutes as needed for chest pain. ) 25 tablet 1  . SYNTHROID 150 MCG tablet TAKE 1 TABLET BY MOUTH EVERY DAY 90 tablet 3  . tiZANidine (ZANAFLEX) 4  MG tablet Take 1 tablet (4 mg total) by mouth every 6 (six) hours as needed for muscle spasms. 40 tablet 1  . traMADol (ULTRAM) 50 MG tablet Take 1 tablet (50 mg total) by mouth every 6 (six) hours as needed for pain. 60 tablet 1   No current facility-administered medications on file prior to visit.   Allergies  Allergen Reactions  . Prilosec [Omeprazole] Other (See Comments)    Chest pain  . Miconazole Nitrate Hives    REACTION: hives  . Prednisone Swelling    Tongue/lip swelling.  . Augmentin [Amoxicillin-Pot Clavulanate] Hives, Itching and Rash  . Doxycycline Other (See Comments)    REACTION: gi upset    ROS: See HPI for pertinent positives and negatives.  Physical Examination  Filed Vitals:   07/15/14 1023  BP: 99/70  Pulse: 69  Resp: 16  Height: 5\' 6"  (1.676 m)  Weight: 220 lb (99.791 kg)  SpO2: 96%   Body mass index is 35.53 kg/(m^2).  General: A&O x 3, WD, obese female.  Pulmonary: Sym exp, good air movt, CTAB, no rales, rhonchi, or wheezing.  Cardiac: RRR, Nl S1, S2, no detected murmur.   Carotid Bruits Left Right   Negative Negative  Aorta is not palpable Radial pulses are 2+ palpable and =   VASCULAR EXAM:     LE Pulses LEFT RIGHT   FEMORAL 2+ palpable 2+ palpable    POPLITEAL not palpable  not palpable   POSTERIOR  TIBIAL not palpable  not palpable    DORSALIS PEDIS  ANTERIOR TIBIAL 2+ palpable  2+ palpable     Gastrointestinal: soft, NTND, -G/R, - HSM, - palpable masses, - CVAT B.  Musculoskeletal: M/S 4/5 throughout, Extremities without ischemic changes.  Neurologic: CN 2-12 intact, Pain and light touch intact in extremities are intact except, Motor exam as listed above.          Non-Invasive Vascular Imaging  AAA Duplex (07/15/2014) ABDOMINAL AORTA DUPLEX EVALUATION    INDICATION: Follow up abdominal aortic aneurysm    PREVIOUS INTERVENTION(S): None    DUPLEX EXAM:     LOCATION DIAMETER AP (cm) DIAMETER TRANSVERSE (cm) VELOCITIES (cm/sec)  Aorta Proximal 2.0 1.2 77  Aorta Mid 3.3 3.2 24  Aorta Distal 4.3 4.7 57  Right Common Iliac Artery 1.2 1.0 65  Left Common Iliac Artery 1.1 .90 114    Previous max aortic diameter:  4.4 x 4.1 cm Date: 01/07/2014  ADDITIONAL FINDINGS:     IMPRESSION: 1. 4.3 x 4.7 cm distal aortic aneurysm    Compared to the previous exam:  Increase in diameter since prior exam     Medical Decision Making  The patient is a 68 y.o. female who presents with asymptomatic AAA with increase in size from 4.4 to 4.7 cm in six months; however a previous AAA Duplex suggested the largest diameter as 4.7 cm.  Unfortunately she continues to smoke. The patient was counseled re smoking cessation and given several free resources re smoking cessation.   Based on this patient's exam and diagnostic studies, the patient will follow up in 6 months  with the following studies: AAA Duplex .  Consideration for repair of AAA would be made when the size is 5.5 cm, growth > 1 cm/yr, and symptomatic status.  I emphasized the importance of maximal medical management including strict control of blood pressure, blood glucose, and lipid levels, antiplatelet agents, obtaining regular exercise, and cessation of smoking.   The patient was given information  about AAA including signs, symptoms, treatment, and how to minimize the risk of enlargement and rupture of aneurysms.    The patient was advised to call 911 should the patient experience sudden onset abdominal or back pain.   Thank you for allowing [place] to participate in this patient's care.  Clemon Chambers, RN, MSN, FNP-C [facility] [place] Office: [phone]  Clinic Physician: Early  07/15/2014, 10:38 AM

## 2014-07-15 NOTE — Progress Notes (Signed)
Subjective:    Patient ID: [name], female    DOB: May 30, 1945, 69 y.o.   MRN: [id number]  HPI  Here with acute onset right lbp x 3 days, sharp, severe, with some radiation to the buttock, and making it difficult to walk with cane even, though also it seems have marked right medial knee pain without swelling, that seems to want to maybe giveawy, but no falls, trauma, fever.  Pt denies chest pain, increased sob or doe, wheezing, orthopnea, PND, increased LE swelling, palpitations, dizziness or syncope.  Pt denies new neurological symptoms such as new headache, or facial or extremity weakness or numbness  Tramadol not working Pt has no bowel or bladder change, fever, wt loss,  worsening LE pain/numbness/weakness except for the above. Past Medical History  Diagnosis Date  . Coronary artery disease     a. s/p inferior MI 1998->PCI of RCA. b. S/p prior PCI of the OM1 and LAD;  c. 05/2013 Cath/PCI: LM nl, LAD 90p (3.0x22 Resolute DES), 68m w/ patent stent, Diag 40p, 32m, LCX patent OM1 stent, RCA 50p, 2m, EF 65%.  . Hyperlipidemia   . Hypertension   . Obesity, unspecified   . Hypothyroidism   . Diabetes mellitus     TYPE II  . Anxiety state, unspecified   . Depressive disorder, not elsewhere classified   . Degeneration of lumbar or lumbosacral intervertebral disc   . Allergic rhinitis, cause unspecified   . Lower GI bleed 06/2010    Diverticular bleed  . Chronic LBP   . AAA (abdominal aortic aneurysm)     a. 4.7cm in 08/2012. Followed by [facility].  . Gout 08/22/2013  . Noncompliance with medications 02/26/2014   Past Surgical History  Procedure Laterality Date  . Total abdominal hysterectomy    . Lumbar fusion  01/2007    DR. Patrice Paradise...3-LEVEL WITH FIXATION  . Parathyroidectomy    . Thyroidectomy    . Cardiac catheterization      PCI OF BOTH THE CIRCUMFLEX AND LEFT ANTERIOR DESCENDING ARTERY  . Cesarean section    . Heart stent  04-2010  and  Jun 07, 2013    X 3  . Spine surgery    . Left  heart catheterization with coronary angiogram N/A 06/07/2013    Procedure: LEFT HEART CATHETERIZATION WITH CORONARY ANGIOGRAM;  Surgeon: [name], MD;  Location: [facility] CATH LAB;  Service: Cardiovascular;  Laterality: N/A;  . Left heart catheterization with coronary angiogram N/A 02/25/2014    Procedure: LEFT HEART CATHETERIZATION WITH CORONARY ANGIOGRAM;  Surgeon: Troy Sine, MD;  Location: [facility] CATH LAB;  Service: Cardiovascular;  Laterality: N/A;    reports that she quit smoking about 28 years ago. Her smoking use included Cigarettes. She quit after 30 years of use. She has never used smokeless tobacco. She reports that she does not drink alcohol or use illicit drugs. family history includes Arthritis in her maternal aunt; Cancer in her maternal aunt; Diabetes in her brother and maternal aunt; Heart attack (age of onset: 29) in her mother; Heart attack (age of onset: 21) in her father; Heart disease in her father and mother. There is no history of Colon cancer. Allergies  Allergen Reactions  . Prilosec [Omeprazole] Other (See Comments)    Chest pain  . Miconazole Nitrate Hives    REACTION: hives  . Prednisone Swelling    Tongue/lip swelling.  . Augmentin [Amoxicillin-Pot Clavulanate] Hives, Itching and Rash  . Doxycycline Other (See Comments)  REACTION: gi upset   Current Outpatient Prescriptions on File Prior to Visit  Medication Sig Dispense Refill  . acetaminophen (TYLENOL) 325 MG tablet Take 2 tablets (650 mg total) by mouth every 4 (four) hours as needed for headache or mild pain.    [name] aspirin EC 81 MG tablet Take 1 tablet (81 mg total) by mouth daily. For 1 month.  Then resume taking 4 times a week (tue thur sat sun) (Patient taking differently: Take 81 mg by mouth as directed. 4 times a week (tue thur sat sun))    . atenolol (TENORMIN) 25 MG tablet Take 1 tablet (25 mg total) by mouth 2 (two) times daily. 180 tablet 3  . EPINEPHrine (EPIPEN 2-PAK) 0.3 mg/0.3 mL IJ SOAJ  injection Inject 0.3 mLs (0.3 mg total) into the muscle once. 1 Device 1  . famotidine (PEPCID) 20 MG tablet Take 1 tablet (20 mg total) by mouth 2 (two) times daily. 60 tablet 0  . gabapentin (NEURONTIN) 300 MG capsule Take 300 mg by mouth 3 (three) times daily as needed (Patient takes 300mg  by mouth three times daily as needed for nerve pain).    . hydrOXYzine (ATARAX/VISTARIL) 10 MG tablet TAKE 1 TABLET BY MOUTH THREE TIMES DAILY AS NEEDED FOR ITCHING 90 tablet 0  . metFORMIN (GLUCOPHAGE-XR) 500 MG 24 hr tablet Take 4 tablets (2,000 mg total) by mouth daily with breakfast.    . nitroGLYCERIN (NITROSTAT) 0.4 MG SL tablet Place 1 tablet (0.4 mg total) under the tongue every 5 (five) minutes as needed. (Patient taking differently: Place 0.4 mg under the tongue every 5 (five) minutes as needed for chest pain. ) 25 tablet 1  . SYNTHROID 150 MCG tablet TAKE 1 TABLET BY MOUTH EVERY DAY 90 tablet 3  . tiZANidine (ZANAFLEX) 4 MG tablet Take 1 tablet (4 mg total) by mouth every 6 (six) hours as needed for muscle spasms. 40 tablet 1  . traMADol (ULTRAM) 50 MG tablet Take 1 tablet (50 mg total) by mouth every 6 (six) hours as needed for pain. 60 tablet 1  . clarithromycin (BIAXIN) 250 MG tablet Take 1 tablet (250 mg total) by mouth 2 (two) times daily. (Patient not taking: Reported on 07/15/2014) 20 tablet 0  . clindamycin (CLEOCIN) 300 MG capsule Take 1 capsule (300 mg total) by mouth 3 (three) times daily. (Patient not taking: Reported on 07/15/2014) 30 capsule 0   No current facility-administered medications on file prior to visit.     Review of Systems  Constitutional: Negative for unusual diaphoresis or night sweats HENT: Negative for ringing in ear or discharge Eyes: Negative for double vision or worsening visual disturbance.  Respiratory: Negative for choking and stridor.   Gastrointestinal: Negative for vomiting or other signifcant bowel change Genitourinary: Negative for hematuria or change in  urine volume.  Musculoskeletal: Negative for other MSK pain or swelling Skin: Negative for color change and worsening wound.  Neurological: Negative for tremors and numbness other than noted  Psychiatric/Behavioral: Negative for decreased concentration or agitation other than above  ;    Objective:   Physical Exam BP 112/84 mmHg  Pulse 86  Temp(Src) 97.8 F (36.6 C) (Oral)  Ht 5\' 6"  (1.676 m)  Wt 220 lb (99.791 kg)  BMI 35.53 kg/m2  SpO2 92% VS noted, not ill appearing Constitutional: Pt appears in no significant distress HENT: Head: NCAT.  Right Ear: External ear normal.  Left Ear: External ear normal.  Eyes: . Pupils are equal, round, and  reactive to light. Conjunctivae and EOM are normal Neck: Normal range of motion. Neck supple.  Cardiovascular: Normal rate and regular rhythm.   Pulmonary/Chest: Effort normal and breath sounds without rales or wheezing.  Spine nontender, upper right buttock not tender, no rash or swelling Abd:  Soft, NT, ND, + BS Neurological: Pt is alert. Not confused , motor 5/5 intact to LE's,  Right knee with moderate bony degenerative changes, no effusion Skin: Skin is warm. No rash, no LE edema Psychiatric: Pt behavior is normal. No agitation.     Assessment & Plan:

## 2014-07-15 NOTE — Patient Instructions (Addendum)
Abdominal Aortic Aneurysm An aneurysm is a weakened or damaged part of an artery wall that bulges from the normal force of blood pumping through the body. An abdominal aortic aneurysm is an aneurysm that occurs in the lower part of the aorta, the main artery of the body.  The major concern with an abdominal aortic aneurysm is that it can enlarge and burst (rupture) or blood can flow between the layers of the wall of the aorta through a tear (aorticdissection). Both of these conditions can cause bleeding inside the body and can be life threatening unless diagnosed and treated promptly. CAUSES  The exact cause of an abdominal aortic aneurysm is unknown. Some contributing factors are:   A hardening of the arteries caused by the buildup of fat and other substances in the lining of a blood vessel (arteriosclerosis).  Inflammation of the walls of an artery (arteritis).   Connective tissue diseases, such as Marfan syndrome.   Abdominal trauma.   An infection, such as syphilis or staphylococcus, in the wall of the aorta (infectious aortitis) caused by bacteria. RISK FACTORS  Risk factors that contribute to an abdominal aortic aneurysm may include:  Age older than 60 years.   High blood pressure (hypertension).  Female gender.  Ethnicity (white race).  Obesity.  Family history of aneurysm (first degree relatives only).  Tobacco use. PREVENTION  The following healthy lifestyle habits may help decrease your risk of abdominal aortic aneurysm:  Quitting smoking. Smoking can raise your blood pressure and cause arteriosclerosis.  Limiting or avoiding alcohol.  Keeping your blood pressure, blood sugar level, and cholesterol levels within normal limits.  Decreasing your salt intake. In somepeople, too much salt can raise blood pressure and increase your risk of abdominal aortic aneurysm.  Eating a diet low in saturated fats and cholesterol.  Increasing your fiber intake by including  whole grains, vegetables, and fruits in your diet. Eating these foods may help lower blood pressure.  Maintaining a healthy weight.  Staying physically active and exercising regularly. SYMPTOMS  The symptoms of abdominal aortic aneurysm may vary depending on the size and rate of growth of the aneurysm.Most grow slowly and do not have any symptoms. When symptoms do occur, they may include:  Pain (abdomen, side, lower back, or groin). The pain may vary in intensity. A sudden onset of severe pain may indicate that the aneurysm has ruptured.  Feeling full after eating only small amounts of food.  Nausea or vomiting or both.  Feeling a pulsating lump in the abdomen.  Feeling faint or passing out. DIAGNOSIS  Since most unruptured abdominal aortic aneurysms have no symptoms, they are often discovered during diagnostic exams for other conditions. An aneurysm may be found during the following procedures:  Ultrasonography (A one-time screening for abdominal aortic aneurysm by ultrasonography is also recommended for all men aged 65-75 years who have ever smoked).  X-ray exams.  A computed tomography (CT).  Magnetic resonance imaging (MRI).  Angiography or arteriography. TREATMENT  Treatment of an abdominal aortic aneurysm depends on the size of your aneurysm, your age, and risk factors for rupture. Medication to control blood pressure and pain may be used to manage aneurysms smaller than 6 cm. Regular monitoring for enlargement may be recommended by your caregiver if:  The aneurysm is 3-4 cm in size (an annual ultrasonography may be recommended).  The aneurysm is 4-4.5 cm in size (an ultrasonography every 6 months may be recommended).  The aneurysm is larger than 4.5 cm in   size (your caregiver may ask that you be examined by a vascular surgeon). If your aneurysm is larger than 6 cm, surgical repair may be recommended. There are two main methods for repair of an aneurysm:   Endovascular  repair (a minimally invasive surgery). This is done most often.  Open repair. This method is used if an endovascular repair is not possible. Document Released: 10/13/2004 Document Revised: 04/30/2012 Document Reviewed: 02/03/2012 [employer] Patient Information 2015 [employer]. This information is not intended to replace advice given to you by your health care provider. Make sure you discuss any questions you have with your health care provider.   Smoking Cessation Quitting smoking is important to your health and has many advantages. However, it is not always easy to quit since nicotine is a very addictive drug. Oftentimes, people try 3 times or more before being able to quit. This document explains the best ways for you to prepare to quit smoking. Quitting takes hard work and a lot of effort, but you can do it. ADVANTAGES OF QUITTING SMOKING  You will live longer, feel better, and live better.  Your body will feel the impact of quitting smoking almost immediately.  Within 20 minutes, blood pressure decreases. Your pulse returns to its normal level.  After 8 hours, carbon monoxide levels in the blood return to normal. Your oxygen level increases.  After 24 hours, the chance of having a heart attack starts to decrease. Your breath, hair, and body stop smelling like smoke.  After 48 hours, damaged nerve endings begin to recover. Your sense of taste and smell improve.  After 72 hours, the body is virtually free of nicotine. Your bronchial tubes relax and breathing becomes easier.  After 2 to 12 weeks, lungs can hold more air. Exercise becomes easier and circulation improves.  The risk of having a heart attack, stroke, cancer, or lung disease is greatly reduced.  After 1 year, the risk of coronary heart disease is cut in half.  After 5 years, the risk of stroke falls to the same as a nonsmoker.  After 10 years, the risk of lung cancer is cut in half and the risk of other cancers  decreases significantly.  After 15 years, the risk of coronary heart disease drops, usually to the level of a nonsmoker.  If you are pregnant, quitting smoking will improve your chances of having a healthy baby.  The people you live with, especially any children, will be healthier.  You will have extra money to spend on things other than cigarettes. QUESTIONS TO THINK ABOUT BEFORE ATTEMPTING TO QUIT You may want to talk about your answers with your health care provider.  Why do you want to quit?  If you tried to quit in the past, what helped and what did not?  What will be the most difficult situations for you after you quit? How will you plan to handle them?  Who can help you through the tough times? Your family? Friends? A health care provider?  What pleasures do you get from smoking? What ways can you still get pleasure if you quit? Here are some questions to ask your health care provider:  How can you help me to be successful at quitting?  What medicine do you think would be best for me and how should I take it?  What should I do if I need more help?  What is smoking withdrawal like? How can I get information on withdrawal? GET READY  Set a quit   date.  Change your environment by getting rid of all cigarettes, ashtrays, matches, and lighters in your home, car, or work. Do not let people smoke in your home.  Review your past attempts to quit. Think about what worked and what did not. GET SUPPORT AND ENCOURAGEMENT You have a better chance of being successful if you have help. You can get support in many ways.  Tell your family, friends, and coworkers that you are going to quit and need their support. Ask them not to smoke around you.  Get individual, group, or telephone counseling and support. Programs are available at local hospitals and health centers. Call your local health department for information about programs in your area.  Spiritual beliefs and practices may  help some smokers quit.  Download a "quit meter" on your computer to keep track of quit statistics, such as how long you have gone without smoking, cigarettes not smoked, and money saved.  Get a self-help book about quitting smoking and staying off tobacco. LEARN NEW SKILLS AND BEHAVIORS  Distract yourself from urges to smoke. Talk to someone, go for a walk, or occupy your time with a task.  Change your normal routine. Take a different route to work. Drink tea instead of coffee. Eat breakfast in a different place.  Reduce your stress. Take a hot bath, exercise, or read a book.  Plan something enjoyable to do every day. Reward yourself for not smoking.  Explore interactive web-based programs that specialize in helping you quit. GET MEDICINE AND USE IT CORRECTLY Medicines can help you stop smoking and decrease the urge to smoke. Combining medicine with the above behavioral methods and support can greatly increase your chances of successfully quitting smoking.  Nicotine replacement therapy helps deliver nicotine to your body without the negative effects and risks of smoking. Nicotine replacement therapy includes nicotine gum, lozenges, inhalers, nasal sprays, and skin patches. Some may be available over-the-counter and others require a prescription.  Antidepressant medicine helps people abstain from smoking, but how this works is unknown. This medicine is available by prescription.  Nicotinic receptor partial agonist medicine simulates the effect of nicotine in your brain. This medicine is available by prescription. Ask your health care provider for advice about which medicines to use and how to use them based on your health history. Your health care provider will tell you what side effects to look out for if you choose to be on a medicine or therapy. Carefully read the information on the package. Do not use any other product containing nicotine while using a nicotine replacement product.    RELAPSE OR DIFFICULT SITUATIONS Most relapses occur within the first 3 months after quitting. Do not be discouraged if you start smoking again. Remember, most people try several times before finally quitting. You may have symptoms of withdrawal because your body is used to nicotine. You may crave cigarettes, be irritable, feel very hungry, cough often, get headaches, or have difficulty concentrating. The withdrawal symptoms are only temporary. They are strongest when you first quit, but they will go away within 10-14 days. To reduce the chances of relapse, try to:  Avoid drinking alcohol. Drinking lowers your chances of successfully quitting.  Reduce the amount of caffeine you consume. Once you quit smoking, the amount of caffeine in your body increases and can give you symptoms, such as a rapid heartbeat, sweating, and anxiety.  Avoid smokers because they can make you want to smoke.  Do not let weight gain distract you. Many   smokers will gain weight when they quit, usually less than 10 pounds. Eat a healthy diet and stay active. You can always lose the weight gained after you quit.  Find ways to improve your mood other than smoking. FOR MORE INFORMATION  www.smokefree.gov  Document Released: 12/28/2000 Document Revised: 05/20/2013 Document Reviewed: 04/14/2011 [employer] Patient Information 2015 [employer]. This information is not intended to replace advice given to you by your health care provider. Make sure you discuss any questions you have with your health care provider.   Smoking Cessation, Tips for Success If you are ready to quit smoking, congratulations! You have chosen to help yourself be healthier. Cigarettes bring nicotine, tar, carbon monoxide, and other irritants into your body. Your lungs, heart, and blood vessels will be able to work better without these poisons. There are many different ways to quit smoking. Nicotine gum, nicotine patches, a nicotine inhaler, or nicotine  nasal spray can help with physical craving. Hypnosis, support groups, and medicines help break the habit of smoking. WHAT THINGS CAN I DO TO MAKE QUITTING EASIER?  Here are some tips to help you quit for good:  Pick a date when you will quit smoking completely. Tell all of your friends and family about your plan to quit on that date.  Do not try to slowly cut down on the number of cigarettes you are smoking. Pick a quit date and quit smoking completely starting on that day.  Throw away all cigarettes.   Clean and remove all ashtrays from your home, work, and car.  On a card, write down your reasons for quitting. Carry the card with you and read it when you get the urge to smoke.  Cleanse your body of nicotine. Drink enough water and fluids to keep your urine clear or pale yellow. Do this after quitting to flush the nicotine from your body.  Learn to predict your moods. Do not let a bad situation be your excuse to have a cigarette. Some situations in your life might tempt you into wanting a cigarette.  Never have "just one" cigarette. It leads to wanting another and another. Remind yourself of your decision to quit.  Change habits associated with smoking. If you smoked while driving or when feeling stressed, try other activities to replace smoking. Stand up when drinking your coffee. Brush your teeth after eating. Sit in a different chair when you read the paper. Avoid alcohol while trying to quit, and try to drink fewer caffeinated beverages. Alcohol and caffeine may urge you to smoke.  Avoid foods and drinks that can trigger a desire to smoke, such as sugary or spicy foods and alcohol.  Ask people who smoke not to smoke around you.  Have something planned to do right after eating or having a cup of coffee. For example, plan to take a walk or exercise.  Try a relaxation exercise to calm you down and decrease your stress. Remember, you may be tense and nervous for the first 2 weeks after  you quit, but this will pass.  Find new activities to keep your hands busy. Play with a pen, coin, or rubber band. Doodle or draw things on paper.  Brush your teeth right after eating. This will help cut down on the craving for the taste of tobacco after meals. You can also try mouthwash.   Use oral substitutes in place of cigarettes. Try using lemon drops, carrots, cinnamon sticks, or chewing gum. Keep them handy so they are available when you   have the urge to smoke.  When you have the urge to smoke, try deep breathing.  Designate your home as a nonsmoking area.  If you are a heavy smoker, ask your health care provider about a prescription for nicotine chewing gum. It can ease your withdrawal from nicotine.  Reward yourself. Set aside the cigarette money you save and buy yourself something nice.  Look for support from others. Join a support group or smoking cessation program. Ask someone at home or at work to help you with your plan to quit smoking.  Always ask yourself, "Do I need this cigarette or is this just a reflex?" Tell yourself, "Today, I choose not to smoke," or "I do not want to smoke." You are reminding yourself of your decision to quit.  Do not replace cigarette smoking with electronic cigarettes (commonly called e-cigarettes). The safety of e-cigarettes is unknown, and some may contain harmful chemicals.  If you relapse, do not give up! Plan ahead and think about what you will do the next time you get the urge to smoke. HOW WILL I FEEL WHEN I QUIT SMOKING? You may have symptoms of withdrawal because your body is used to nicotine (the addictive substance in cigarettes). You may crave cigarettes, be irritable, feel very hungry, cough often, get headaches, or have difficulty concentrating. The withdrawal symptoms are only temporary. They are strongest when you first quit but will go away within 10-14 days. When withdrawal symptoms occur, stay in control. Think about your reasons  for quitting. Remind yourself that these are signs that your body is healing and getting used to being without cigarettes. Remember that withdrawal symptoms are easier to treat than the major diseases that smoking can cause.  Even after the withdrawal is over, expect periodic urges to smoke. However, these cravings are generally short lived and will go away whether you smoke or not. Do not smoke! WHAT RESOURCES ARE AVAILABLE TO HELP ME QUIT SMOKING? Your health care provider can direct you to community resources or hospitals for support, which may include:  Group support.  Education.  Hypnosis.  Therapy. Document Released: 10/02/2003 Document Revised: 05/20/2013 Document Reviewed: 06/21/2012 [employer] Patient Information 2015 [employer]. This information is not intended to replace advice given to you by your health care provider. Make sure you discuss any questions you have with your health care provider.   

## 2014-07-15 NOTE — Assessment & Plan Note (Signed)
With acute flare, last MRI 2012 with known mod to sever lumber djd and ddd, for lmited short term oxycodone prn, toradol prn, also refer back to Dr Calton Dach

## 2014-07-15 NOTE — Addendum Note (Signed)
Addended by: Lyman Bishop on: 07/15/2014 05:53 PM   Modules accepted: Orders

## 2014-07-15 NOTE — Progress Notes (Signed)
Pre visit review using our clinic review tool, if applicable. No additional management support is needed unless otherwise documented below in the visit note. 

## 2014-07-15 NOTE — Assessment & Plan Note (Signed)
stable overall by history and exam, recent data reviewed with pt, and pt to continue medical treatment as before,  to f/u any worsening symptoms or concerns / BP Readings from Last 3 Encounters:  07/15/14 112/84  07/15/14 99/70  06/03/14 114/72  \

## 2014-07-18 ENCOUNTER — Telehealth: Payer: Self-pay

## 2014-07-18 NOTE — Telephone Encounter (Signed)
Tried to start PA for Voltaren Gel.   Informed that pt does not have coverage with payor.

## 2014-07-25 NOTE — Telephone Encounter (Signed)
Pt is thinking that it needs to go [facility].  That all the info she has

## 2014-07-25 NOTE — Telephone Encounter (Signed)
LVM for pt to call back as soon as possible.   RE: we did not have the correct insurance information to process the PA.

## 2014-07-28 NOTE — Telephone Encounter (Signed)
rx coverage has changed for pt. The copay for the rx is $200.00 under her new part b coverage.

## 2014-07-29 ENCOUNTER — Other Ambulatory Visit: Payer: Self-pay | Admitting: Internal Medicine

## 2014-07-29 MED ORDER — TRAMADOL HCL 50 MG PO TABS: 50.0000 mg | ORAL_TABLET | Freq: Four times a day (QID) | ORAL | Status: DC | PRN

## 2014-07-29 NOTE — Telephone Encounter (Signed)
Pt agreed to try Tramadol. Rx printed and faxed. Pt informed

## 2014-07-29 NOTE — Telephone Encounter (Signed)
Left message on machine for pt to return my call  

## 2014-07-29 NOTE — Telephone Encounter (Signed)
Please advise on alternative medication in PCP's absence. Pt cannot tolerate oral NSAIDs, thanks

## 2014-07-29 NOTE — Telephone Encounter (Signed)
Try Tramadol prn See Rx Thx

## 2014-07-29 NOTE — Addendum Note (Signed)
Addended by: Lyman Bishop on: 07/29/2014 10:17 AM   Modules accepted: Orders

## 2014-08-12 DIAGNOSIS — M1712 Unilateral primary osteoarthritis, left knee: Secondary | ICD-10-CM | POA: Diagnosis not present

## 2014-08-12 DIAGNOSIS — M1711 Unilateral primary osteoarthritis, right knee: Secondary | ICD-10-CM | POA: Diagnosis not present

## 2014-08-13 ENCOUNTER — Ambulatory Visit: Payer: Medicare Other | Admitting: Family Medicine

## 2014-08-18 LAB — HM DIABETES EYE EXAM

## 2014-08-19 ENCOUNTER — Encounter: Payer: Self-pay | Admitting: Internal Medicine

## 2014-08-20 DIAGNOSIS — Z006 Encounter for examination for normal comparison and control in clinical research program: Secondary | ICD-10-CM

## 2014-08-20 NOTE — Progress Notes (Unsigned)
STATUS FIRST Informed Consent    Subject Name: [name]   Subject met inclusion and exclusion criteria.The informed consent form, study requirements and expectations were reviewed with the subject and questions and concerns were addressed prior to the signing of the consent form. The subject verbalized understanding of the trail requirements. The subject agreed to participate in the [place] research study and signed the informed consent at 10:58am on 8/316. The informed consent was obtained prior to performance of any protocol-specific procedures for the subject. A copy of the signed informed consent was given to the subject and a copy was placed in the subjects's medical record.    [place] Chalmers 08/20/2014 10:58

## 2014-09-04 DIAGNOSIS — G894 Chronic pain syndrome: Secondary | ICD-10-CM | POA: Diagnosis not present

## 2014-09-04 DIAGNOSIS — M47896 Other spondylosis, lumbar region: Secondary | ICD-10-CM | POA: Diagnosis not present

## 2014-09-16 DIAGNOSIS — M47817 Spondylosis without myelopathy or radiculopathy, lumbosacral region: Secondary | ICD-10-CM | POA: Diagnosis not present

## 2014-09-16 DIAGNOSIS — M961 Postlaminectomy syndrome, not elsewhere classified: Secondary | ICD-10-CM | POA: Diagnosis not present

## 2014-09-16 DIAGNOSIS — M545 Low back pain: Secondary | ICD-10-CM | POA: Diagnosis not present

## 2014-09-16 DIAGNOSIS — M47816 Spondylosis without myelopathy or radiculopathy, lumbar region: Secondary | ICD-10-CM | POA: Diagnosis not present

## 2014-09-23 ENCOUNTER — Encounter: Payer: Self-pay | Admitting: Internal Medicine

## 2014-09-23 ENCOUNTER — Ambulatory Visit (INDEPENDENT_AMBULATORY_CARE_PROVIDER_SITE_OTHER): Payer: Medicare Other | Admitting: Internal Medicine

## 2014-09-23 VITALS — BP 110/60 | HR 84 | Temp 98.2°F | Ht 66.0 in | Wt 220.0 lb

## 2014-09-23 DIAGNOSIS — I10 Essential (primary) hypertension: Secondary | ICD-10-CM | POA: Diagnosis not present

## 2014-09-23 DIAGNOSIS — E11311 Type 2 diabetes mellitus with unspecified diabetic retinopathy with macular edema: Secondary | ICD-10-CM | POA: Diagnosis not present

## 2014-09-23 DIAGNOSIS — J069 Acute upper respiratory infection, unspecified: Secondary | ICD-10-CM | POA: Diagnosis not present

## 2014-09-23 DIAGNOSIS — I2510 Atherosclerotic heart disease of native coronary artery without angina pectoris: Secondary | ICD-10-CM | POA: Diagnosis not present

## 2014-09-23 MED ORDER — DOXYCYCLINE HYCLATE 100 MG PO TABS: 100.0000 mg | ORAL_TABLET | Freq: Two times a day (BID) | ORAL | Status: DC

## 2014-09-23 NOTE — Assessment & Plan Note (Signed)
stable overall by history and exam, recent data reviewed with pt, and pt to continue medical treatment as before,  to f/u any worsening symptoms or concerns Lab Results  Component Value Date   HGBA1C 7.0* 08/22/2013    

## 2014-09-23 NOTE — Progress Notes (Signed)
Pre visit review using our clinic review tool, if applicable. No additional management support is needed unless otherwise documented below in the visit note. 

## 2014-09-23 NOTE — Assessment & Plan Note (Signed)
Mild to mod, for antibx course,  to f/u any worsening symptoms or concerns 

## 2014-09-23 NOTE — Progress Notes (Signed)
Subjective:    Patient ID: [name], female    DOB: January 04, 1946, 69 y.o.   MRN: [id number]  HPI   Here with 1 wk acute onset fever, right > left ear pain, facial pain, pressure, headache, general weakness and malaise, with mild ST and non prod cough, but pt denies chest pain, wheezing, increased sob or doe, orthopnea, PND, increased LE swelling, palpitations, dizziness or syncope. States has had rocephin IM several times in past successfully despite listed allergy of augmentin. Pt denies new neurological symptoms such as new headache, or facial or extremity weakness or numbness   Pt denies polydipsia, polyuria Past Medical History  Diagnosis Date  . Coronary artery disease     a. s/p inferior MI 1998->PCI of RCA. b. S/p prior PCI of the OM1 and LAD;  c. 05/2013 Cath/PCI: LM nl, LAD 90p (3.0x22 Resolute DES), 5m w/ patent stent, Diag 40p, 19m, LCX patent OM1 stent, RCA 50p, 20m, EF 65%.  . Hyperlipidemia   . Hypertension   . Obesity, unspecified   . Hypothyroidism   . Diabetes mellitus     TYPE II  . Anxiety state, unspecified   . Depressive disorder, not elsewhere classified   . Degeneration of lumbar or lumbosacral intervertebral disc   . Allergic rhinitis, cause unspecified   . Lower GI bleed 06/2010    Diverticular bleed  . Chronic LBP   . AAA (abdominal aortic aneurysm)     a. 4.7cm in 08/2012. Followed by [facility].  . Gout 08/22/2013  . Noncompliance with medications 02/26/2014   Past Surgical History  Procedure Laterality Date  . Total abdominal hysterectomy    . Lumbar fusion  01/2007    DR. Patrice Paradise...3-LEVEL WITH FIXATION  . Parathyroidectomy    . Thyroidectomy    . Cardiac catheterization      PCI OF BOTH THE CIRCUMFLEX AND LEFT ANTERIOR DESCENDING ARTERY  . Cesarean section    . Heart stent  04-2010  and  Jun 07, 2013    X 3  . Spine surgery    . Left heart catheterization with coronary angiogram N/A 06/07/2013    Procedure: LEFT HEART CATHETERIZATION WITH CORONARY  ANGIOGRAM;  Surgeon: [name], MD;  Location: [facility] CATH LAB;  Service: Cardiovascular;  Laterality: N/A;  . Left heart catheterization with coronary angiogram N/A 02/25/2014    Procedure: LEFT HEART CATHETERIZATION WITH CORONARY ANGIOGRAM;  Surgeon: Troy Sine, MD;  Location: [facility] CATH LAB;  Service: Cardiovascular;  Laterality: N/A;    reports that she quit smoking about 28 years ago. Her smoking use included Cigarettes. She quit after 30 years of use. She has never used smokeless tobacco. She reports that she does not drink alcohol or use illicit drugs. family history includes Arthritis in her maternal aunt; Cancer in her maternal aunt; Diabetes in her brother and maternal aunt; Heart attack (age of onset: 54) in her mother; Heart attack (age of onset: 35) in her father; Heart disease in her father and mother. There is no history of Colon cancer. Allergies  Allergen Reactions  . Prilosec [Omeprazole] Other (See Comments)    Chest pain  . Miconazole Nitrate Hives    REACTION: hives  . Prednisone Swelling    Tongue/lip swelling.  . Augmentin [Amoxicillin-Pot Clavulanate] Hives, Itching and Rash  . Doxycycline Other (See Comments)    REACTION: gi upset   Current Outpatient Prescriptions on File Prior to Visit  Medication Sig Dispense Refill  . acetaminophen (TYLENOL) 325  MG tablet Take 2 tablets (650 mg total) by mouth every 4 (four) hours as needed for headache or mild pain.    [name] aspirin EC 81 MG tablet Take 1 tablet (81 mg total) by mouth daily. For 1 month.  Then resume taking 4 times a week (tue thur sat sun) (Patient taking differently: Take 81 mg by mouth as directed. 4 times a week (tue thur sat sun))    . atenolol (TENORMIN) 25 MG tablet Take 1 tablet (25 mg total) by mouth 2 (two) times daily. 180 tablet 3  . diclofenac sodium (VOLTAREN) 1 % GEL Apply 4 g topically 4 (four) times daily as needed. 400 g 5  . EPINEPHrine (EPIPEN 2-PAK) 0.3 mg/0.3 mL IJ SOAJ injection Inject  0.3 mLs (0.3 mg total) into the muscle once. 1 Device 1  . famotidine (PEPCID) 20 MG tablet Take 1 tablet (20 mg total) by mouth 2 (two) times daily. 60 tablet 0  . gabapentin (NEURONTIN) 300 MG capsule Take 300 mg by mouth 3 (three) times daily as needed (Patient takes 300mg  by mouth three times daily as needed for nerve pain).    . hydrOXYzine (ATARAX/VISTARIL) 10 MG tablet TAKE 1 TABLET BY MOUTH THREE TIMES DAILY AS NEEDED FOR ITCHING 90 tablet 0  . metFORMIN (GLUCOPHAGE-XR) 500 MG 24 hr tablet Take 4 tablets (2,000 mg total) by mouth daily with breakfast.    . nitroGLYCERIN (NITROSTAT) 0.4 MG SL tablet Place 1 tablet (0.4 mg total) under the tongue every 5 (five) minutes as needed. (Patient taking differently: Place 0.4 mg under the tongue every 5 (five) minutes as needed for chest pain. ) 25 tablet 1  . oxyCODONE-acetaminophen (ROXICET) 5-325 MG per tablet Take 1 tablet by mouth every 6 (six) hours as needed for severe pain. 50 tablet 0  . SYNTHROID 150 MCG tablet TAKE 1 TABLET BY MOUTH EVERY DAY 90 tablet 3  . tiZANidine (ZANAFLEX) 4 MG tablet Take 1 tablet (4 mg total) by mouth every 6 (six) hours as needed for muscle spasms. 40 tablet 1  . traMADol (ULTRAM) 50 MG tablet Take 1 tablet (50 mg total) by mouth every 6 (six) hours as needed. 60 tablet 1  . clarithromycin (BIAXIN) 250 MG tablet Take 1 tablet (250 mg total) by mouth 2 (two) times daily. (Patient not taking: Reported on 07/15/2014) 20 tablet 0   No current facility-administered medications on file prior to visit.    aReview of Systems  Constitutional: Negative for unusual diaphoresis or night sweats HENT: Negative for ringing in ear or discharge Eyes: Negative for double vision or worsening visual disturbance.  Respiratory: Negative for choking and stridor.   Gastrointestinal: Negative for vomiting or other signifcant bowel change Genitourinary: Negative for hematuria or change in urine volume.  Musculoskeletal: Negative for  other MSK pain or swelling Skin: Negative for color change and worsening wound.  Neurological: Negative for tremors and numbness other than noted  Psychiatric/Behavioral: Negative for decreased concentration or agitation other than above       Objective:   Physical Exam BP 110/60 mmHg  Pulse 84  Temp(Src) 98.2 F (36.8 C) (Oral)  Ht 5\' 6"  (1.676 m)  Wt 220 lb (99.791 kg)  BMI 35.53 kg/m2  SpO2 96% VS noted, mild ill Constitutional: Pt appears in no significant distress HENT: Head: NCAT.  Right Ear: External ear normal.  Left Ear: External ear normal.  Bilat tm's with R > L marked erythema.  Max sinus areas non tender.  Pharynx with mild erythema, no exudate Eyes: . Pupils are equal, round, and reactive to light. Conjunctivae and EOM are normal Neck: Normal range of motion. Neck supple.  Cardiovascular: Normal rate and regular rhythm.   Pulmonary/Chest: Effort normal and breath sounds without rales or wheezing.  Neurological: Pt is alert. Not confused , motor grossly intact Skin: Skin is warm. No rash, no LE edema Psychiatric: Pt behavior is normal. No agitation.        Assessment & Plan:

## 2014-09-23 NOTE — Assessment & Plan Note (Signed)
stable overall by history and exam, recent data reviewed with pt, and pt to continue medical treatment as before,  to f/u any worsening symptoms or concerns BP Readings from Last 3 Encounters:  09/23/14 110/60  07/15/14 112/84  07/15/14 99/70

## 2014-09-23 NOTE — Patient Instructions (Signed)
You had the antibiotic and pain shot today  Please take all new medication as prescribed - the pill antibiotic  Please continue all other medications as before, and refills have been done if requested.  Please have the pharmacy call with any other refills you may need.  Please keep your appointments with your specialists as you may have planned

## 2014-09-24 DIAGNOSIS — J069 Acute upper respiratory infection, unspecified: Secondary | ICD-10-CM | POA: Diagnosis not present

## 2014-09-24 MED ORDER — CEFTRIAXONE SODIUM 500 MG IJ SOLR
500.0000 mg | Freq: Once | INTRAMUSCULAR | Status: AC
  Administered 2014-09-23: 500 mg via INTRAMUSCULAR

## 2014-09-24 MED ORDER — KETOROLAC TROMETHAMINE 30 MG/ML IM SOLN
30.0000 mg | Freq: Once | INTRAMUSCULAR | Status: AC
  Administered 2014-09-24: 30 mg via INTRAMUSCULAR

## 2014-09-24 NOTE — Addendum Note (Signed)
Addended by: Lyman Bishop on: 09/24/2014 09:07 AM   Modules accepted: Orders

## 2014-10-15 DIAGNOSIS — G894 Chronic pain syndrome: Secondary | ICD-10-CM | POA: Diagnosis not present

## 2014-10-15 DIAGNOSIS — Z23 Encounter for immunization: Secondary | ICD-10-CM | POA: Diagnosis not present

## 2014-11-04 ENCOUNTER — Ambulatory Visit (INDEPENDENT_AMBULATORY_CARE_PROVIDER_SITE_OTHER)
Admission: RE | Admit: 2014-11-04 | Discharge: 2014-11-04 | Disposition: A | Discharge status: Home / Self Care | Payer: Medicare Other | Source: Ambulatory Visit | Attending: Internal Medicine | Admitting: Internal Medicine

## 2014-11-04 ENCOUNTER — Encounter: Payer: Self-pay | Admitting: Internal Medicine

## 2014-11-04 ENCOUNTER — Ambulatory Visit (INDEPENDENT_AMBULATORY_CARE_PROVIDER_SITE_OTHER): Payer: Medicare Other | Admitting: Internal Medicine

## 2014-11-04 ENCOUNTER — Telehealth: Payer: Self-pay | Admitting: Internal Medicine

## 2014-11-04 VITALS — BP 100/66 | HR 90 | Temp 99.1°F | Resp 18 | Wt 219.0 lb

## 2014-11-04 DIAGNOSIS — M7989 Other specified soft tissue disorders: Secondary | ICD-10-CM | POA: Diagnosis not present

## 2014-11-04 DIAGNOSIS — S0990XA Unspecified injury of head, initial encounter: Secondary | ICD-10-CM | POA: Diagnosis not present

## 2014-11-04 DIAGNOSIS — M79644 Pain in right finger(s): Secondary | ICD-10-CM

## 2014-11-04 DIAGNOSIS — M25561 Pain in right knee: Secondary | ICD-10-CM | POA: Diagnosis not present

## 2014-11-04 DIAGNOSIS — M25562 Pain in left knee: Secondary | ICD-10-CM

## 2014-11-04 DIAGNOSIS — S62639A Displaced fracture of distal phalanx of unspecified finger, initial encounter for closed fracture: Secondary | ICD-10-CM

## 2014-11-04 MED ORDER — KETOROLAC TROMETHAMINE 30 MG/ML IJ SOLN
30.0000 mg | Freq: Once | INTRAMUSCULAR | Status: AC
  Administered 2014-11-04: 30 mg via INTRAMUSCULAR

## 2014-11-04 NOTE — Patient Instructions (Signed)
You received Toradol at the office, which should help with your pain  Have the x-ray done of the finger and we will call you with the results.  I expect your pain to improve over the next several days.Continue to take ibuprofen as needed. You can also use heat or cold as needed.  If your symptoms are not improving or worsen in any way please call or return.

## 2014-11-04 NOTE — Progress Notes (Signed)
Subjective:    Patient ID: [name], female    DOB: August 22, 1945, 69 y.o.   MRN: [id number]  HPI She is here today because she fell at home.  She fell Sunday, two days ago.  She bent down and was too close to the counter.  She hit her forehead and right eye on the counter.  She hit her arms on the drawers.  She somehow injured her right index finger, which is now swollen and very painful. She landed on both knees and they are tender.  She is unsure if she lost consciousness. She did remain on the floor for a while.  Her right eye is slightly swollen and black and blue.She thinks she just hit her forehead, possibly her right eye. She denies any changes in vision including blurry vision and photophobia. She has not had any headaches, lightheadedness, dizziness, nausea or confusion.  She cut her upper lip and this did bleed initially. She has a slight scab there and it is tender.  She did wash this out with hydrogen peroxide after the fall.  Both knees are tender.  She denies any significant swelling in the knees.     Medications and allergies reviewed with patient and updated if appropriate.  Patient Active Problem List   Diagnosis Date Noted  . Right knee pain 07/15/2014  . Acute upper respiratory infection 06/09/2014  . Angioedema of lips 06/03/2014  . Frequent urination 05/08/2014  . Acute epiglottitis without obstruction 05/05/2014  . Angioedema   . Epiglottitis 04/06/2014  . Neck swelling   . Abdominal tenderness 03/26/2014  . Noncompliance with medications 02/26/2014  . AAA (abdominal aortic aneurysm) ([place])   . Chest pain, stable coronary arteries at cath, non cardiac pain, resolved 02/24/2014  . Jaw pain 09/25/2013  . Myalgia 09/25/2013  . Gout 08/22/2013  . Abdominal pain, unspecified site 08/13/2013  . Musculoskeletal pain 08/04/2013  . Aftercare following surgery of the circulatory system, [place] 06/25/2013  . Unstable angina pectoris ([place]) 06/06/2013  .  Peripheral vascular disease, unspecified ([place]) 06/19/2012  . Dry cough 03/05/2012  . Dysuria 04/21/2011  . Chronic LBP 10/04/2010  . Acute ischemic colitis ([place]) 07/29/2010  . Lower GI bleed 07/23/2010  . Liver lesion 07/23/2010  . Preventative health care 07/22/2010  . URTICARIA 09/23/2009  . TOBACCO ABUSE 11/18/2008  . Obesity 11/17/2008  . MENOPAUSAL DISORDER 10/29/2007  . ANXIETY 06/22/2007  . DEPRESSION 06/22/2007  . OTITIS MEDIA, SEROUS, CHRONIC 04/23/2007  . CONSTIPATION 02/07/2007  . [place] DISEASE, LUMBOSACRAL SPINE 02/07/2007  . Diabetes ([place]) 11/03/2006  . ALLERGIC RHINITIS 11/03/2006  . HYPOTHYROIDISM 07/31/2006  . Hyperlipidemia 07/31/2006  . Essential hypertension 07/31/2006  . Coronary atherosclerosis 07/31/2006    Past Medical History  Diagnosis Date  . Coronary artery disease     a. s/p inferior MI 1998->PCI of RCA. b. S/p prior PCI of the OM1 and LAD;  c. 05/2013 Cath/PCI: LM nl, LAD 90p (3.0x22 Resolute DES), 49m w/ patent stent, Diag 40p, 83m, LCX patent OM1 stent, RCA 50p, 27m, EF 65%.  . Hyperlipidemia   . Hypertension   . Obesity, unspecified   . Hypothyroidism   . Diabetes mellitus     TYPE II  . Anxiety state, unspecified   . Depressive disorder, not elsewhere classified   . Degeneration of lumbar or lumbosacral intervertebral disc   . Allergic rhinitis, cause unspecified   . Lower GI bleed 06/2010    Diverticular bleed  . Chronic LBP   .  AAA (abdominal aortic aneurysm)     a. 4.7cm in 08/2012. Followed by [facility].  . Gout 08/22/2013  . Noncompliance with medications 02/26/2014    Past Surgical History  Procedure Laterality Date  . Total abdominal hysterectomy    . Lumbar fusion  01/2007    DR. Patrice Paradise...3-LEVEL WITH FIXATION  . Parathyroidectomy    . Thyroidectomy    . Cardiac catheterization      PCI OF BOTH THE CIRCUMFLEX AND LEFT ANTERIOR DESCENDING ARTERY  . Cesarean section    . Heart stent  04-2010  and  Jun 07, 2013    X 3  . Spine surgery     . Left heart catheterization with coronary angiogram N/A 06/07/2013    Procedure: LEFT HEART CATHETERIZATION WITH CORONARY ANGIOGRAM;  Surgeon: [name], MD;  Location: [facility] CATH LAB;  Service: Cardiovascular;  Laterality: N/A;  . Left heart catheterization with coronary angiogram N/A 02/25/2014    Procedure: LEFT HEART CATHETERIZATION WITH CORONARY ANGIOGRAM;  Surgeon: Troy Sine, MD;  Location: [facility] CATH LAB;  Service: Cardiovascular;  Laterality: N/A;    Social History   Social History  . Marital Status: Divorced    Spouse Name: N/A  . Number of Children: 3  . Years of Education: N/A   Occupational History  . 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     Social History Main Topics  . Smoking status: Former Smoker -- 30 years    Types: Cigarettes    Quit date: 05/31/1986  . Smokeless tobacco: Never Used  . Alcohol Use: No  . Drug Use: No  . Sexual Activity: Not on file   Other Topics Concern  . Not on file   Social History Narrative   DIVORCED   3 CHILDREN   PATIENT SIGNED A DESIGNATED PARTY RELEASE TO ALLOW HER DAUGHTER, TRAMAINE Warf, TO HAVE ACCESS TO HER MEDICAL RECORDS/INFORMATION. [name], May 04, 2009 @ 3:27 PM   Smokes cigarettes on rare occasions.    Review of Systems  Eyes: Negative for photophobia and visual disturbance.  Respiratory: Negative for shortness of breath.   Cardiovascular: Negative for chest pain and palpitations.  Gastrointestinal: Negative for nausea.  Musculoskeletal:       Bilateral knee pain, no knee swelling, right index finger pain and swelling  Skin:       Bruises on arm  Neurological: Negative for dizziness, weakness, light-headedness, numbness and headaches.  Psychiatric/Behavioral: Negative for confusion.       Objective:   Filed Vitals:   11/04/14 1439  BP: 100/66  Pulse: 90  Temp: 99.1 F (37.3 C)  Resp: 18   Filed Weights   11/04/14 1439  Weight: 219 lb (99.338 kg)   Body mass index  is 35.36 kg/(m^2).   Physical Exam  Constitutional: She is oriented to person, place, and time. She appears well-developed and well-nourished. No distress.  HENT:  Head: Normocephalic.  Right orbit bruising and mild swelling, upper lip slightly swollen and tender, scabbed scar upper lip without discharge  Eyes: Conjunctivae are normal. Right eye exhibits no discharge.  Neck: Neck supple. No tracheal deviation present. No thyromegaly present.  Musculoskeletal: She exhibits no edema.  Bilateral knee pain without effusion, no bruising.  Right index finger with swelling, redness, tenderness especially overlying PIP, difficulty bending finger;mild posterior neck and upper back muscular pain  Lymphadenopathy:    She has no cervical adenopathy.  Neurological: She is alert and oriented  to person, place, and time. No cranial nerve deficit.  Skin:  Small bruises on right anterior upper arm - slightly tender, no significant arm swelling or pain   Psychiatric: She has a normal mood and affect. Her behavior is normal. Judgment and thought content normal.       Assessment & Plan:   Head injury Secondary to fall at home - hit head ? LOC, no concerning symptoms of concussion Bruising, swelling and pain near right orbit and upper lip  Symptomatic treatment  Bilateral knee pain No effusion or swelling Symptomatic treatment  Right index finger Swelling, pain, redness Concern for a fracture Xray today to rule out fracture  She is in significant pain and had difficulty sleeping last night She would like an injection of toradol, which she has had in the past for significant pain and it was helpful.  Will give Toradol 30 mg IM x 1 today for pain in finger

## 2014-11-04 NOTE — Telephone Encounter (Signed)
Call her - the xray of her finger shows a likely fracture.  She needs to see ortho... Urgent referral ordered.

## 2014-11-05 NOTE — Telephone Encounter (Signed)
Spoke to pt to inform

## 2014-11-06 DIAGNOSIS — S62650A Nondisplaced fracture of medial phalanx of right index finger, initial encounter for closed fracture: Secondary | ICD-10-CM | POA: Diagnosis not present

## 2014-11-06 DIAGNOSIS — M79641 Pain in right hand: Secondary | ICD-10-CM | POA: Diagnosis not present

## 2014-11-06 DIAGNOSIS — M19041 Primary osteoarthritis, right hand: Secondary | ICD-10-CM | POA: Diagnosis not present

## 2014-11-14 ENCOUNTER — Ambulatory Visit (INDEPENDENT_AMBULATORY_CARE_PROVIDER_SITE_OTHER): Payer: Medicare Other | Admitting: Internal Medicine

## 2014-11-14 VITALS — BP 126/82 | HR 80 | Temp 98.1°F | Ht 66.0 in | Wt 220.0 lb

## 2014-11-14 DIAGNOSIS — I2510 Atherosclerotic heart disease of native coronary artery without angina pectoris: Secondary | ICD-10-CM | POA: Diagnosis not present

## 2014-11-14 DIAGNOSIS — E11311 Type 2 diabetes mellitus with unspecified diabetic retinopathy with macular edema: Secondary | ICD-10-CM

## 2014-11-14 DIAGNOSIS — M10472 Other secondary gout, left ankle and foot: Secondary | ICD-10-CM

## 2014-11-14 DIAGNOSIS — I10 Essential (primary) hypertension: Secondary | ICD-10-CM

## 2014-11-14 MED ORDER — ALLOPURINOL 100 MG PO TABS: 100.0000 mg | ORAL_TABLET | Freq: Every day | ORAL | Status: DC

## 2014-11-14 MED ORDER — KETOROLAC TROMETHAMINE 30 MG/ML IM SOLN
30.0000 mg | Freq: Once | INTRAMUSCULAR | Status: AC
  Administered 2014-11-14: 30 mg via INTRAMUSCULAR

## 2014-11-14 MED ORDER — METHYLPREDNISOLONE ACETATE 80 MG/ML IJ SUSP
80.0000 mg | Freq: Once | INTRAMUSCULAR | Status: AC
  Administered 2014-11-14: 80 mg via INTRAMUSCULAR

## 2014-11-14 MED ORDER — PREDNISONE 10 MG PO TABS: ORAL_TABLET | ORAL | Status: DC

## 2014-11-14 NOTE — Progress Notes (Signed)
Pre visit review using our clinic review tool, if applicable. No additional management support is needed unless otherwise documented below in the visit note. 

## 2014-11-14 NOTE — Patient Instructions (Addendum)
You had the toradol (pain) shot and the steroid shot today  Please take all new medication as prescribed - the prednisone, and allopurinol - BUT:  If you start the prednisone today, please wait 3 days (or until the joint pain and swelling is improved) to start the allopurinol  Please continue all other medications as before, and refills have been done if requested.  Please have the pharmacy call with any other refills you may need.  Please continue your efforts at being more active, low cholesterol diet, and weight control.  Please keep your appointments with your specialists as you may have planned  Please return in 4 months, or sooner if needed

## 2014-11-14 NOTE — Progress Notes (Signed)
Subjective:    Patient ID: [name], female    DOB: 1945-01-27, 69 y.o.   MRN: [id number]  HPI  Here with acute onset mod to severe pain, red, swelling coincidently to left first MTP and PIP right index finger, no trauma or fever except did have recent fx right index finger but that had improved, had recent Uric acid not elevated, no longer on preventive therapy, wants to re-start.  Pt denies chest pain, increased sob or doe, wheezing, orthopnea, PND, increased LE swelling, palpitations, dizziness or syncope.  Pt denies new neurological symptoms such as new headache, or facial or extremity weakness or numbness   Pt denies polydipsia, polyuria Past Medical History  Diagnosis Date  . Coronary artery disease     a. s/p inferior MI 1998->PCI of RCA. b. S/p prior PCI of the OM1 and LAD;  c. 05/2013 Cath/PCI: LM nl, LAD 90p (3.0x22 Resolute DES), 92m w/ patent stent, Diag 40p, 33m, LCX patent OM1 stent, RCA 50p, 71m, EF 65%.  . Hyperlipidemia   . Hypertension   . Obesity, unspecified   . Hypothyroidism   . Diabetes mellitus     TYPE II  . Anxiety state, unspecified   . Depressive disorder, not elsewhere classified   . Degeneration of lumbar or lumbosacral intervertebral disc   . Allergic rhinitis, cause unspecified   . Lower GI bleed 06/2010    Diverticular bleed  . Chronic LBP   . AAA (abdominal aortic aneurysm) ([place])     a. 4.7cm in 08/2012. Followed by [facility].  . Gout 08/22/2013  . Noncompliance with medications 02/26/2014   Past Surgical History  Procedure Laterality Date  . Total abdominal hysterectomy    . Lumbar fusion  01/2007    DR. Patrice Paradise...3-LEVEL WITH FIXATION  . Parathyroidectomy    . Thyroidectomy    . Cardiac catheterization      PCI OF BOTH THE CIRCUMFLEX AND LEFT ANTERIOR DESCENDING ARTERY  . Cesarean section    . Heart stent  04-2010  and  Jun 07, 2013    X 3  . Spine surgery    . Left heart catheterization with coronary angiogram N/A 06/07/2013    Procedure: LEFT HEART  CATHETERIZATION WITH CORONARY ANGIOGRAM;  Surgeon: [name], MD;  Location: [facility] CATH LAB;  Service: Cardiovascular;  Laterality: N/A;  . Left heart catheterization with coronary angiogram N/A 02/25/2014    Procedure: LEFT HEART CATHETERIZATION WITH CORONARY ANGIOGRAM;  Surgeon: Troy Sine, MD;  Location: [facility] CATH LAB;  Service: Cardiovascular;  Laterality: N/A;    reports that she quit smoking about 28 years ago. Her smoking use included Cigarettes. She quit after 30 years of use. She has never used smokeless tobacco. She reports that she does not drink alcohol or use illicit drugs. family history includes Arthritis in her maternal aunt; Cancer in her maternal aunt; Diabetes in her brother and maternal aunt; Heart attack (age of onset: 34) in her mother; Heart attack (age of onset: 55) in her father; Heart disease in her father and mother. There is no history of Colon cancer. Allergies  Allergen Reactions  . Prilosec [Omeprazole] Other (See Comments)    Chest pain  . Miconazole Nitrate Hives    REACTION: hives  . Prednisone Swelling    Tongue/lip swelling.  . Augmentin [Amoxicillin-Pot Clavulanate] Hives, Itching and Rash  . Doxycycline Other (See Comments)    REACTION: gi upset    Review of Systems  Constitutional: Negative for unusual  diaphoresis or night sweats HENT: Negative for ringing in ear or discharge Eyes: Negative for double vision or worsening visual disturbance.  Respiratory: Negative for choking and stridor.   Gastrointestinal: Negative for vomiting or other signifcant bowel change Genitourinary: Negative for hematuria or change in urine volume.  Musculoskeletal: Negative for other MSK pain or swelling Skin: Negative for color change and worsening wound.  Neurological: Negative for tremors and numbness other than noted  Psychiatric/Behavioral: Negative for decreased concentration or agitation other than above       Objective:   Physical Exam BP 126/82  mmHg  Pulse 80  Temp(Src) 98.1 F (36.7 C) (Oral)  Ht 5\' 6"  (1.676 m)  Wt 220 lb (99.791 kg)  BMI 35.53 kg/m2  SpO2 97% VS noted,  Constitutional: Pt appears in no significant distress HENT: Head: NCAT.  Right Ear: External ear normal.  Left Ear: External ear normal.  Eyes: . Pupils are equal, round, and reactive to light. Conjunctivae and EOM are normal Neck: Normal range of motion. Neck supple.  Cardiovascular: Normal rate and regular rhythm.   Pulmonary/Chest: Effort normal and breath sounds without rales or wheezing.  Abd:  Soft, NT, ND, + BS Neurological: Pt is alert. Not confused , motor grossly intact Skin: Skin is warm. No rash, no LE edema Psychiatric: Pt behavior is normal. No agitation. Left first MTP with 2-3+ red/tender/swelling, other joints with no significant abnormal Right index PIP with 3+ red/tender/swelling nonfluctuant, no open wounds or drainage, no red streaks, o/w neurovasc intact     Assessment & Plan:

## 2014-11-16 NOTE — Assessment & Plan Note (Addendum)
With acute flare, for depomedrol IM, predpac asd, toradol IM today repeat, and pain control for home, also for allopurinol to start about 3 days after acute improved

## 2014-11-16 NOTE — Assessment & Plan Note (Signed)
stable overall by history and exam, recent data reviewed with pt, and pt to continue medical treatment as before,  to f/u any worsening symptoms or concerns BP Readings from Last 3 Encounters:  11/14/14 126/82  11/04/14 100/66  09/23/14 110/60

## 2014-11-16 NOTE — Assessment & Plan Note (Signed)
stable overall by history and exam, recent data reviewed with pt, and pt to continue medical treatment as before,  to f/u any worsening symptoms or concerns Lab Results  Component Value Date   HGBA1C 7.0* 08/22/2013    

## 2014-11-17 ENCOUNTER — Ambulatory Visit (INDEPENDENT_AMBULATORY_CARE_PROVIDER_SITE_OTHER): Payer: Medicare Other | Admitting: Cardiovascular Disease

## 2014-11-17 ENCOUNTER — Encounter: Payer: Self-pay | Admitting: Cardiovascular Disease

## 2014-11-17 VITALS — BP 116/68 | HR 67 | Ht 66.0 in | Wt 220.1 lb

## 2014-11-17 DIAGNOSIS — I10 Essential (primary) hypertension: Secondary | ICD-10-CM | POA: Diagnosis not present

## 2014-11-17 DIAGNOSIS — R0602 Shortness of breath: Secondary | ICD-10-CM

## 2014-11-17 DIAGNOSIS — E785 Hyperlipidemia, unspecified: Secondary | ICD-10-CM

## 2014-11-17 DIAGNOSIS — I2510 Atherosclerotic heart disease of native coronary artery without angina pectoris: Secondary | ICD-10-CM | POA: Diagnosis not present

## 2014-11-17 NOTE — Progress Notes (Signed)
Cardiology Office Note Date:  11/17/2014   ID:  FAWNE HUGHLEY, DOB Mar 11, 1945, MRN [id number]  PCP:  [name], MD  Cardiologist:  Sherren Mocha, MD    Chief Complaint  Patient presents with  . Shortness of Breath     History of Present Illness: [name] is a 69 y.o. female who presents for follow-up of coronary artery disease. The patient has had previous stenting of the LAD and left circumflex. She presented with symptoms concerning for unstable angina in February 2016. Cardiac catheterization demonstrated patency of her stent sites and mild nonobstructive CAD.  She continues to have shortness of breath with exertion. This is her primary cardiac related today. She denies orthopnea or PND. She does admit to lower extremity swelling at times. She has had problems with gout involving her left foot. She had a recent fall that occurred when she was bending forward. She denies syncope. She does have dizziness at times. She reports no recent changes in her medications.  Past Medical History  Diagnosis Date  . Coronary artery disease     a. s/p inferior MI 1998->PCI of RCA. b. S/p prior PCI of the OM1 and LAD;  c. 05/2013 Cath/PCI: LM nl, LAD 90p (3.0x22 Resolute DES), 15m w/ patent stent, Diag 40p, 78m, LCX patent OM1 stent, RCA 50p, 22m, EF 65%.  . Hyperlipidemia   . Hypertension   . Obesity, unspecified   . Hypothyroidism   . Diabetes mellitus     TYPE II  . Anxiety state, unspecified   . Depressive disorder, not elsewhere classified   . Degeneration of lumbar or lumbosacral intervertebral disc   . Allergic rhinitis, cause unspecified   . Lower GI bleed 06/2010    Diverticular bleed  . Chronic LBP   . AAA (abdominal aortic aneurysm) ([place])     a. 4.7cm in 08/2012. Followed by [facility].  . Gout 08/22/2013  . Noncompliance with medications 02/26/2014    Past Surgical History  Procedure Laterality Date  . Total abdominal hysterectomy    . Lumbar fusion  01/2007    DR.  Patrice Paradise...3-LEVEL WITH FIXATION  . Parathyroidectomy    . Thyroidectomy    . Cardiac catheterization      PCI OF BOTH THE CIRCUMFLEX AND LEFT ANTERIOR DESCENDING ARTERY  . Cesarean section    . Heart stent  04-2010  and  Jun 07, 2013    X 3  . Spine surgery    . Left heart catheterization with coronary angiogram N/A 06/07/2013    Procedure: LEFT HEART CATHETERIZATION WITH CORONARY ANGIOGRAM;  Surgeon: [name], MD;  Location: [facility] CATH LAB;  Service: Cardiovascular;  Laterality: N/A;  . Left heart catheterization with coronary angiogram N/A 02/25/2014    Procedure: LEFT HEART CATHETERIZATION WITH CORONARY ANGIOGRAM;  Surgeon: Troy Sine, MD;  Location: [facility] CATH LAB;  Service: Cardiovascular;  Laterality: N/A;    Current Outpatient Prescriptions  Medication Sig Dispense Refill  . acetaminophen (TYLENOL) 325 MG tablet Take 2 tablets (650 mg total) by mouth every 4 (four) hours as needed for headache or mild pain.    [name] aspirin EC 81 MG tablet Take 1 tablet (81 mg total) by mouth daily. For 1 month.  Then resume taking 4 times a week (tue thur sat sun) (Patient taking differently: Take 81 mg by mouth as directed. 4 times a week (tue thur sat sun))    . atenolol (TENORMIN) 25 MG tablet Take 1 tablet (25 mg total)  by mouth 2 (two) times daily. 180 tablet 3  . hydrOXYzine (ATARAX/VISTARIL) 10 MG tablet TAKE 1 TABLET BY MOUTH THREE TIMES DAILY AS NEEDED FOR ITCHING 90 tablet 0  . metFORMIN (GLUCOPHAGE-XR) 500 MG 24 hr tablet Take 4 tablets (2,000 mg total) by mouth daily with breakfast.    . nitroGLYCERIN (NITROSTAT) 0.4 MG SL tablet Place 1 tablet (0.4 mg total) under the tongue every 5 (five) minutes as needed. (Patient taking differently: Place 0.4 mg under the tongue every 5 (five) minutes as needed for chest pain. ) 25 tablet 1  . oxyCODONE-acetaminophen (ROXICET) 5-325 MG per tablet Take 1 tablet by mouth every 6 (six) hours as needed for severe pain. 50 tablet 0  . SYNTHROID 150 MCG  tablet TAKE 1 TABLET BY MOUTH EVERY DAY 90 tablet 3  . tiZANidine (ZANAFLEX) 4 MG tablet Take 1 tablet (4 mg total) by mouth every 6 (six) hours as needed for muscle spasms. 40 tablet 1  . traMADol (ULTRAM) 50 MG tablet Take 1 tablet (50 mg total) by mouth every 6 (six) hours as needed. 60 tablet 1  . allopurinol (ZYLOPRIM) 100 MG tablet Take 1 tablet (100 mg total) by mouth daily. (Patient not taking: Reported on 11/17/2014) 90 tablet 3  . predniSONE (DELTASONE) 10 MG tablet 3 tabs by mouth per day for 3 days,2tabs per day for 3 days,1tab per day for 3 days (Patient not taking: Reported on 11/17/2014) 18 tablet 0   No current facility-administered medications for this visit.    Allergies:   Prilosec; Miconazole nitrate; Prednisone; Augmentin; and Doxycycline   Social History:  The patient  reports that she quit smoking about 28 years ago. Her smoking use included Cigarettes. She quit after 30 years of use. She has never used smokeless tobacco. She reports that she does not drink alcohol or use illicit drugs.   Family History:  The patient's  family history includes Arthritis in her maternal aunt; Cancer in her maternal aunt; Diabetes in her brother and maternal aunt; Heart attack (age of onset: 78) in her mother; Heart attack (age of onset: 64) in her father; Heart disease in her father and mother. There is no history of Colon cancer.    ROS:  Please see the history of present illness.  Otherwise, review of systems is positive for excessive sweating, excessive fatigue.  All other systems are reviewed and negative.    PHYSICAL EXAM: VS:  BP 116/68 mmHg  Pulse 67  Ht 5\' 6"  (1.676 m)  Wt 220 lb 1.9 oz (99.846 kg)  BMI 35.55 kg/m2 , BMI Body mass index is 35.55 kg/(m^2). GEN: Well nourished, well developed, in no acute distress HEENT: normal Neck: no JVD, no masses. No carotid bruits Cardiac: RRR without murmur or gallop                Respiratory:  clear to auscultation bilaterally, normal  work of breathing GI: soft, nontender, nondistended, + BS MS: no deformity or atrophy, left foot edema/tenderness at first MTP joint Ext: trace pretibial edema Skin: warm and dry, no rash Neuro:  Strength and sensation are intact Psych: euthymic mood, full affect  EKG:  EKG is ordered today. The ekg ordered today shows normal sinus rhythm, left axis deviation, pulmonary disease pattern  Recent Labs: 05/05/2014: TSH 2.618 05/06/2014: BUN 7; Creatinine, Ser 0.85; Hemoglobin 13.5; Platelets 301; Potassium 3.7; Sodium 141 07/03/2014: ALT 21   Lipid Panel     Component Value Date/Time   CHOL  171 07/03/2014 0759   TRIG 166.0* 07/03/2014 0759   TRIG 102 01/02/2006 0825   HDL 35.30* 07/03/2014 0759   CHOLHDL 5 07/03/2014 0759   CHOLHDL 3.3 CALC 01/02/2006 0825   VLDL 33.2 07/03/2014 0759   LDLCALC 103* 07/03/2014 0759   LDLDIRECT 73.1 02/27/2012 1122      Wt Readings from Last 3 Encounters:  11/17/14 220 lb 1.9 oz (99.846 kg)  11/14/14 220 lb (99.791 kg)  11/04/14 219 lb (99.338 kg)     Cardiac Studies Reviewed: Cardiac Cath 02/25/2014: ANGIOGRAPHY:  Left main: Angiographically normal and bifurcated into the LAD and left circumflex coronary artery.   LAD: Moderate size vessel that had a widely patent stent proximally. The vessel gave rise to 2 diagonal vessels and extended to and wrapped around the LV apex. There is minimal luminal irregularity.  Left circumflex: Moderate size vessel that had a widely patent stent at the R June and proximal portion of the obtuse marginal 1 branch. Remainder of the vessel was normal.  Right coronary artery: Small to moderate size vessel that had smooth 40% proximal diffuse narrowing.  Left ventriculography revealed normal LV contractility subtle minimal residual focal mid inferior hypocontractility..Ejection fraction is approximately 55-60%.  Distal Aortagraphy revealed moderate sized mid-distal infarenal abdominal aortic aneurysm. There was  no evidence for renal artery stenosis. The common iliacs appeared free of significant disease.  IMPRESSION:  Normal LV function with an ejection fraction of 55-60% and minimal residual focal mid inferior hypocontractility.  Mild not significantly obstructive CAD with widely patent stents in the LAD, circumflex marginal vessel, and 40% diffuse narrowing in the proximal RCA.  RECOMMENDATION:  Medical therapy.  ASSESSMENT AND PLAN: 1.  CAD, native vessel: No symptoms of angina. She tolerates aspirin and a beta blocker.  2. Essential Hypertension: Well controlled. Should be on an ACE inhibitor in the setting of diabetes and coronary disease, but her blood pressure runs low, she has dizziness, and history of angioedema. I think best to avoid at this point.  3. Hyperlipidemia: Historically she has been unwilling or unable to take statin drugs. They have been prescribed and she stops taking them on her own. We discussed again today but I'm not inclined to start her back on a statin because of her history of intolerance to these medications.  4. AAA: followed by Dr Early  5. Shortness of breath: Considering her coronary disease and other cardiovascular problems, I have recommended an echocardiogram to further assess for systolic or diastolic dysfunction.  Current medicines are reviewed with the patient today.  The patient does not have concerns regarding medicines.  Labs/ tests ordered today include:   Orders Placed This Encounter  Procedures  . EKG 12-Lead  . Echocardiogram    Disposition:   FU 6 months  Signed, Sherren Mocha, MD  11/17/2014 1:34 PM    [facility] [address], [place], [place]  [address] Phone: [phone]; Fax: [phone]

## 2014-11-17 NOTE — Patient Instructions (Signed)
Medication Instructions:  Your physician recommends that you continue on your current medications as directed. Please refer to the Current Medication list given to you today.  Labwork: No new orders.   Testing/Procedures: Your physician has requested that you have an echocardiogram. Echocardiography is a painless test that uses sound waves to create images of your heart. It provides your doctor with information about the size and shape of your heart and how well your heart's chambers and valves are working. This procedure takes approximately one hour. There are no restrictions for this procedure.  Follow-Up: Your physician wants you to follow-up in: 6 MONTHS with Dr Cooper.  You will receive a reminder letter in the mail two months in advance. If you don't receive a letter, please call our office to schedule the follow-up appointment.   Any Other Special Instructions Will Be Listed Below (If Applicable).     If you need a refill on your cardiac medications before your next appointment, please call your pharmacy.   

## 2014-11-20 DIAGNOSIS — M19041 Primary osteoarthritis, right hand: Secondary | ICD-10-CM | POA: Diagnosis not present

## 2014-11-26 ENCOUNTER — Other Ambulatory Visit: Payer: Self-pay

## 2014-11-26 ENCOUNTER — Ambulatory Visit (HOSPITAL_COMMUNITY): Payer: Medicare Other | Attending: Cardiovascular Disease

## 2014-11-26 DIAGNOSIS — I10 Essential (primary) hypertension: Secondary | ICD-10-CM | POA: Insufficient documentation

## 2014-11-26 DIAGNOSIS — I059 Rheumatic mitral valve disease, unspecified: Secondary | ICD-10-CM | POA: Diagnosis not present

## 2014-11-26 DIAGNOSIS — R0602 Shortness of breath: Secondary | ICD-10-CM | POA: Diagnosis not present

## 2014-12-04 DIAGNOSIS — S62650D Nondisplaced fracture of medial phalanx of right index finger, subsequent encounter for fracture with routine healing: Secondary | ICD-10-CM | POA: Diagnosis not present

## 2014-12-17 DIAGNOSIS — M19041 Primary osteoarthritis, right hand: Secondary | ICD-10-CM | POA: Diagnosis not present

## 2014-12-20 ENCOUNTER — Other Ambulatory Visit: Payer: Medicare Other

## 2014-12-26 ENCOUNTER — Other Ambulatory Visit: Payer: Medicare Other

## 2015-01-01 DIAGNOSIS — H04123 Dry eye syndrome of bilateral lacrimal glands: Secondary | ICD-10-CM | POA: Diagnosis not present

## 2015-01-16 ENCOUNTER — Other Ambulatory Visit (INDEPENDENT_AMBULATORY_CARE_PROVIDER_SITE_OTHER): Payer: Medicare Other

## 2015-01-16 ENCOUNTER — Ambulatory Visit (INDEPENDENT_AMBULATORY_CARE_PROVIDER_SITE_OTHER): Payer: Medicare Other | Admitting: Internal Medicine

## 2015-01-16 VITALS — BP 114/66 | HR 85 | Temp 97.8°F

## 2015-01-16 DIAGNOSIS — R300 Dysuria: Secondary | ICD-10-CM

## 2015-01-16 DIAGNOSIS — J069 Acute upper respiratory infection, unspecified: Secondary | ICD-10-CM | POA: Diagnosis not present

## 2015-01-16 DIAGNOSIS — M10072 Idiopathic gout, left ankle and foot: Secondary | ICD-10-CM | POA: Diagnosis not present

## 2015-01-16 DIAGNOSIS — I2510 Atherosclerotic heart disease of native coronary artery without angina pectoris: Secondary | ICD-10-CM

## 2015-01-16 DIAGNOSIS — I10 Essential (primary) hypertension: Secondary | ICD-10-CM

## 2015-01-16 DIAGNOSIS — E11311 Type 2 diabetes mellitus with unspecified diabetic retinopathy with macular edema: Secondary | ICD-10-CM | POA: Diagnosis not present

## 2015-01-16 DIAGNOSIS — M109 Gout, unspecified: Secondary | ICD-10-CM

## 2015-01-16 LAB — URINALYSIS, ROUTINE W REFLEX MICROSCOPIC
Bilirubin Urine: NEGATIVE
Ketones, ur: NEGATIVE
Leukocytes, UA: NEGATIVE
NITRITE: NEGATIVE
PH: 5.5 (ref 5.0–8.0)
SPECIFIC GRAVITY, URINE: 1.02 (ref 1.000–1.030)
URINE GLUCOSE: NEGATIVE
UROBILINOGEN UA: 1 (ref 0.0–1.0)

## 2015-01-16 MED ORDER — ALLOPURINOL 100 MG PO TABS: 100.0000 mg | ORAL_TABLET | Freq: Every day | ORAL | Status: DC

## 2015-01-16 MED ORDER — COLCHICINE 0.6 MG PO TABS: 0.6000 mg | ORAL_TABLET | Freq: Two times a day (BID) | ORAL | Status: DC | PRN

## 2015-01-16 MED ORDER — LEVOFLOXACIN 250 MG PO TABS: 250.0000 mg | ORAL_TABLET | Freq: Every day | ORAL | Status: DC

## 2015-01-16 MED ORDER — HYDROCODONE-ACETAMINOPHEN 10-325 MG PO TABS: 1.0000 | ORAL_TABLET | Freq: Four times a day (QID) | ORAL | Status: DC | PRN

## 2015-01-16 MED ORDER — KETOROLAC TROMETHAMINE 30 MG/ML IM SOLN
30.0000 mg | Freq: Once | INTRAMUSCULAR | Status: AC
  Administered 2015-01-16: 30 mg via INTRAMUSCULAR

## 2015-01-16 MED ORDER — PREDNISONE 10 MG PO TABS: ORAL_TABLET | ORAL | Status: DC

## 2015-01-16 MED ORDER — METHYLPREDNISOLONE ACETATE 80 MG/ML IJ SUSP
80.0000 mg | Freq: Once | INTRAMUSCULAR | Status: AC
  Administered 2015-01-16: 80 mg via INTRAMUSCULAR

## 2015-01-16 NOTE — Progress Notes (Signed)
Subjective:    Patient ID: [name], female    DOB: 09/23/1945, 69 y.o.   MRN: [id number]  HPI  Here to f/u, with 3-4 days onset severe left foot pain, swelling distally with erythema; tenderness seems worse at 4th and 5th MTP's.  Also with milder but new onset similar symptoms to right knee this am.  Has not been taking the allopurinol daily, thought she could just take it prn for some reason.  Pt denies chest pain, increased sob or doe, wheezing, orthopnea, PND, increased LE swelling, palpitations, dizziness or syncope.   Pt denies fever, wt loss, night sweats, loss of appetite, or other constitutional symptoms  Pt denies new neurological symptoms such as new headache, or facial or extremity weakness or numbness.  ALso incidentally -  Here with 2-3 days acute onset fever, facial pain, pressure, headache, general weakness and malaise, and greenish d/c, with mild ST and cough, but pt denies chest pain, wheezing, increased sob or doe, orthopnea, PND, increased LE swelling, palpitations, dizziness or syncope.  ALso c/o mild dysuria this am on urination but has not occurred since. Past Medical History  Diagnosis Date  . Coronary artery disease     a. s/p inferior MI 1998->PCI of RCA. b. S/p prior PCI of the OM1 and LAD;  c. 05/2013 Cath/PCI: LM nl, LAD 90p (3.0x22 Resolute DES), 82m w/ patent stent, Diag 40p, 6m, LCX patent OM1 stent, RCA 50p, 25m, EF 65%.  . Hyperlipidemia   . Hypertension   . Obesity, unspecified   . Hypothyroidism   . Diabetes mellitus     TYPE II  . Anxiety state, unspecified   . Depressive disorder, not elsewhere classified   . Degeneration of lumbar or lumbosacral intervertebral disc   . Allergic rhinitis, cause unspecified   . Lower GI bleed 06/2010    Diverticular bleed  . Chronic LBP   . AAA (abdominal aortic aneurysm) ([place])     a. 4.7cm in 08/2012. Followed by [facility].  . Gout 08/22/2013  . Noncompliance with medications 02/26/2014   Past Surgical History  Procedure  Laterality Date  . Total abdominal hysterectomy    . Lumbar fusion  01/2007    DR. Patrice Paradise...3-LEVEL WITH FIXATION  . Parathyroidectomy    . Thyroidectomy    . Cardiac catheterization      PCI OF BOTH THE CIRCUMFLEX AND LEFT ANTERIOR DESCENDING ARTERY  . Cesarean section    . Heart stent  04-2010  and  Jun 07, 2013    X 3  . Spine surgery    . Left heart catheterization with coronary angiogram N/A 06/07/2013    Procedure: LEFT HEART CATHETERIZATION WITH CORONARY ANGIOGRAM;  Surgeon: [name], MD;  Location: [facility] CATH LAB;  Service: Cardiovascular;  Laterality: N/A;  . Left heart catheterization with coronary angiogram N/A 02/25/2014    Procedure: LEFT HEART CATHETERIZATION WITH CORONARY ANGIOGRAM;  Surgeon: Troy Sine, MD;  Location: [facility] CATH LAB;  Service: Cardiovascular;  Laterality: N/A;    reports that she quit smoking about 28 years ago. Her smoking use included Cigarettes. She quit after 30 years of use. She has never used smokeless tobacco. She reports that she does not drink alcohol or use illicit drugs. family history includes Arthritis in her maternal aunt; Cancer in her maternal aunt; Diabetes in her brother and maternal aunt; Heart attack (age of onset: 48) in her mother; Heart attack (age of onset: 65) in her father; Heart disease in her father  and mother. There is no history of Colon cancer. Allergies  Allergen Reactions  . Prilosec [Omeprazole] Other (See Comments)    Chest pain  . Miconazole Nitrate Hives    REACTION: hives  . Prednisone Swelling    Tongue/lip swelling.  . Augmentin [Amoxicillin-Pot Clavulanate] Hives, Itching and Rash  . Doxycycline Other (See Comments)    REACTION: gi upset   Current Outpatient Prescriptions on File Prior to Visit  Medication Sig Dispense Refill  . acetaminophen (TYLENOL) 325 MG tablet Take 2 tablets (650 mg total) by mouth every 4 (four) hours as needed for headache or mild pain.    [name] aspirin EC 81 MG tablet Take 1 tablet  (81 mg total) by mouth daily. For 1 month.  Then resume taking 4 times a week (tue thur sat sun) (Patient taking differently: Take 81 mg by mouth as directed. 4 times a week (tue thur sat sun))    . atenolol (TENORMIN) 25 MG tablet Take 1 tablet (25 mg total) by mouth 2 (two) times daily. 180 tablet 3  . hydrOXYzine (ATARAX/VISTARIL) 10 MG tablet TAKE 1 TABLET BY MOUTH THREE TIMES DAILY AS NEEDED FOR ITCHING 90 tablet 0  . metFORMIN (GLUCOPHAGE-XR) 500 MG 24 hr tablet Take 4 tablets (2,000 mg total) by mouth daily with breakfast.    . nitroGLYCERIN (NITROSTAT) 0.4 MG SL tablet Place 1 tablet (0.4 mg total) under the tongue every 5 (five) minutes as needed. (Patient taking differently: Place 0.4 mg under the tongue every 5 (five) minutes as needed for chest pain. ) 25 tablet 1  . SYNTHROID 150 MCG tablet TAKE 1 TABLET BY MOUTH EVERY DAY 90 tablet 3  . tiZANidine (ZANAFLEX) 4 MG tablet Take 1 tablet (4 mg total) by mouth every 6 (six) hours as needed for muscle spasms. 40 tablet 1  . traMADol (ULTRAM) 50 MG tablet Take 1 tablet (50 mg total) by mouth every 6 (six) hours as needed. 60 tablet 1   No current facility-administered medications on file prior to visit.   Review of Systems  Constitutional: Negative for unusual diaphoresis or night sweats HENT: Negative for ringing in ear or discharge Eyes: Negative for double vision or worsening visual disturbance.  Respiratory: Negative for choking and stridor.   Gastrointestinal: Negative for vomiting or other signifcant bowel change Genitourinary: Negative for hematuria or change in urine volume.  Musculoskeletal: Negative for other MSK pain or swelling Skin: Negative for color change and worsening wound.  Neurological: Negative for tremors and numbness other than noted  Psychiatric/Behavioral: Negative for decreased concentration or agitation other than above       Objective:   Physical Exam BP 114/66 mmHg  Pulse 85  Temp(Src) 97.8 F (36.6 C)  (Oral)  SpO2 97% VS noted, in wheelchair Constitutional: Pt appears in no significant distress HENT: Head: NCAT.  Right Ear: External ear normal.  Left Ear: External ear normal.  Eyes: . Pupils are equal, round, and reactive to light. Conjunctivae and EOM are normal Neck: Normal range of motion. Neck supple.  Cardiovascular: Normal rate and regular rhythm.   Pulmonary/Chest: Effort normal and breath sounds without rales or wheezing.  Abd:  Soft, NT, ND, + BS Neurological: Pt is alert. Not confused , motor grossly intact Skin: Skin is warm. No rash, no LE edema Psychiatric: Pt behavior is normal. No agitation.  Left foot with 2-3+ distal swelling/erythema worst at the 4th and 5th MTPs Right knee with 1+ effusion, erythema, pain with ROM  Assessment & Plan:

## 2015-01-16 NOTE — Assessment & Plan Note (Signed)
Lab Results  Component Value Date   HGBA1C 7.0* 08/22/2013   stable overall by history and exam, recent data reviewed with pt, and pt to continue medical treatment as before,  to f/u any worsening symptoms or concerns, pt to call for onset polys or cbg > 200 on current tx

## 2015-01-16 NOTE — Patient Instructions (Addendum)
You had the steroid shot today, and the pain shot (toradol)  Please take all new medication as prescribed - the antibiotic, and the pain medication if needed, and the colchicine for the future if it tries to come back  Please continue all other medications as before, and refills have been done if requested - including the allopurinol, that you should take Every Day.   Please have the pharmacy call with any other refills you may need.  Please continue your efforts at being more active, low cholesterol diabetic diet, and weight control.  Please keep your appointments with your specialists as you may have planned

## 2015-01-16 NOTE — Assessment & Plan Note (Addendum)
Left foot and right knee, for toradol IM and depomedrol IM, predpac asd, for colchicine prn and daily allopurinol 100 qd, for uric acid with labs with next labs  Note:  Total time for pt hx, exam, review of record with pt in the room, determination of diagnoses and plan for further eval and tx is > 40 min, with over 50% spent in coordination and counseling of patient

## 2015-01-16 NOTE — Assessment & Plan Note (Signed)
Mild to mod, for antibx course,  to f/u any worsening symptoms or concerns 

## 2015-01-16 NOTE — Progress Notes (Signed)
Pre visit review using our clinic review tool, if applicable. No additional management support is needed unless otherwise documented below in the visit note. 

## 2015-01-16 NOTE — Assessment & Plan Note (Signed)
stable overall by history and exam, recent data reviewed with pt, and pt to continue medical treatment as before,  to f/u any worsening symptoms or concerns BP Readings from Last 3 Encounters:  01/16/15 114/66  11/17/14 116/68  11/14/14 126/82

## 2015-01-17 LAB — URINE CULTURE

## 2015-01-20 ENCOUNTER — Ambulatory Visit: Payer: Medicare Other | Admitting: Family

## 2015-01-20 ENCOUNTER — Other Ambulatory Visit (HOSPITAL_COMMUNITY): Payer: Medicare Other

## 2015-01-20 DIAGNOSIS — M19041 Primary osteoarthritis, right hand: Secondary | ICD-10-CM | POA: Diagnosis not present

## 2015-01-23 ENCOUNTER — Ambulatory Visit (INDEPENDENT_AMBULATORY_CARE_PROVIDER_SITE_OTHER): Payer: Medicare Other | Admitting: Internal Medicine

## 2015-01-23 ENCOUNTER — Encounter: Payer: Self-pay | Admitting: Internal Medicine

## 2015-01-23 VITALS — BP 116/70 | HR 86 | Temp 97.7°F | Ht 66.0 in | Wt 219.0 lb

## 2015-01-23 DIAGNOSIS — M109 Gout, unspecified: Secondary | ICD-10-CM

## 2015-01-23 DIAGNOSIS — E11311 Type 2 diabetes mellitus with unspecified diabetic retinopathy with macular edema: Secondary | ICD-10-CM

## 2015-01-23 DIAGNOSIS — M10072 Idiopathic gout, left ankle and foot: Secondary | ICD-10-CM

## 2015-01-23 DIAGNOSIS — I10 Essential (primary) hypertension: Secondary | ICD-10-CM | POA: Diagnosis not present

## 2015-01-23 MED ORDER — PREDNISONE 10 MG PO TABS: ORAL_TABLET | ORAL | Status: DC

## 2015-01-23 MED ORDER — METHYLPREDNISOLONE ACETATE 80 MG/ML IJ SUSP
80.0000 mg | Freq: Once | INTRAMUSCULAR | Status: AC
  Administered 2015-01-23: 80 mg via INTRAMUSCULAR

## 2015-01-23 MED ORDER — KETOROLAC TROMETHAMINE 30 MG/ML IM SOLN
30.0000 mg | Freq: Once | INTRAMUSCULAR | Status: AC
  Administered 2015-01-23: 30 mg via INTRAMUSCULAR

## 2015-01-23 MED ORDER — COLCHICINE 0.6 MG PO TABS: ORAL_TABLET | ORAL | Status: DC

## 2015-01-23 NOTE — Assessment & Plan Note (Addendum)
Resolved, the flared again in last 2-3 days not as severe appearing foot but pain still severe; for repeat depomedrol IM and toradol today,, predpac asd restart, and add colchicine bid for 2 wks, then prn after that, and urged compliance with all treatment including the allopurinol

## 2015-01-23 NOTE — Progress Notes (Signed)
Pre visit review using our clinic review tool, if applicable. No additional management support is needed unless otherwise documented below in the visit note. 

## 2015-01-23 NOTE — Assessment & Plan Note (Signed)
stable overall by history and exam, recent data reviewed with pt, and pt to continue medical treatment as before,  to f/u any worsening symptoms or concerns Lab Results  Component Value Date   HGBA1C 7.0* 08/22/2013   Cont to monitor cbgs at home, call for > 200

## 2015-01-23 NOTE — Patient Instructions (Addendum)
You had the steroid shot today, and the toradol shot today for pain  Please take all new medication as prescribed - the repeat prednisone, but also take the colchcine TWICE per day for 2 wks  OK to continue the allopurinol EVERY day  In the future, you can take the colchicine for flares, with taking 1 every hour until improved or until you might have diarrhea  Please continue all other medications as before, including the pain medication you already have  Please have the pharmacy call with any other refills you may need.  Please continue your efforts at being more active, low cholesterol diet, and weight control.  Please keep your appointments with your specialists as you may have planned

## 2015-01-23 NOTE — Assessment & Plan Note (Signed)
stable overall by history and exam, recent data reviewed with pt, and pt to continue medical treatment as before,  to f/u any worsening symptoms or concerns  BP Readings from Last 3 Encounters:  01/23/15 116/70  01/16/15 114/66  11/17/14 116/68

## 2015-01-23 NOTE — Addendum Note (Signed)
Addended by: Lyman Bishop on: 01/23/2015 11:14 AM   Modules accepted: Orders

## 2015-01-23 NOTE — Progress Notes (Signed)
Subjective:    Patient ID: [name], female    DOB: 05-31-1945, 70 y.o.   MRN: [id number]  HPI  Here unfortunately with flare or severe pain,red,swelling, tender to right 5th MTP are again, similar to last visit, did very well with last treatment and felt back to normal for 1 day, but then flared again in the past 2-3 days despite the weaning dose prednisone.  Current pain med not working well but still has some left. She asks if she really has to take the allopurinol every day, has still not been taking meds as prescribed.  Pt denies chest pain, increased sob or doe, wheezing, orthopnea, PND, increased LE swelling, palpitations, dizziness or syncope.  Pt denies new neurological symptoms such as new headache, or facial or extremity weakness or numbness   Pt denies polydipsia, polyuria Past Medical History  Diagnosis Date  . Coronary artery disease     a. s/p inferior MI 1998->PCI of RCA. b. S/p prior PCI of the OM1 and LAD;  c. 05/2013 Cath/PCI: LM nl, LAD 90p (3.0x22 Resolute DES), 53m w/ patent stent, Diag 40p, 80m, LCX patent OM1 stent, RCA 50p, 47m, EF 65%.  . Hyperlipidemia   . Hypertension   . Obesity, unspecified   . Hypothyroidism   . Diabetes mellitus     TYPE II  . Anxiety state, unspecified   . Depressive disorder, not elsewhere classified   . Degeneration of lumbar or lumbosacral intervertebral disc   . Allergic rhinitis, cause unspecified   . Lower GI bleed 06/2010    Diverticular bleed  . Chronic LBP   . AAA (abdominal aortic aneurysm) ([place])     a. 4.7cm in 08/2012. Followed by [facility].  . Gout 08/22/2013  . Noncompliance with medications 02/26/2014   Past Surgical History  Procedure Laterality Date  . Total abdominal hysterectomy    . Lumbar fusion  01/2007    DR. Patrice Paradise...3-LEVEL WITH FIXATION  . Parathyroidectomy    . Thyroidectomy    . Cardiac catheterization      PCI OF BOTH THE CIRCUMFLEX AND LEFT ANTERIOR DESCENDING ARTERY  . Cesarean section    . Heart stent   04-2010  and  Jun 07, 2013    X 3  . Spine surgery    . Left heart catheterization with coronary angiogram N/A 06/07/2013    Procedure: LEFT HEART CATHETERIZATION WITH CORONARY ANGIOGRAM;  Surgeon: [name], MD;  Location: [facility] CATH LAB;  Service: Cardiovascular;  Laterality: N/A;  . Left heart catheterization with coronary angiogram N/A 02/25/2014    Procedure: LEFT HEART CATHETERIZATION WITH CORONARY ANGIOGRAM;  Surgeon: Troy Sine, MD;  Location: [facility] CATH LAB;  Service: Cardiovascular;  Laterality: N/A;    reports that she quit smoking about 28 years ago. Her smoking use included Cigarettes. She quit after 30 years of use. She has never used smokeless tobacco. She reports that she does not drink alcohol or use illicit drugs. family history includes Arthritis in her maternal aunt; Cancer in her maternal aunt; Diabetes in her brother and maternal aunt; Heart attack (age of onset: 10) in her mother; Heart attack (age of onset: 13) in her father; Heart disease in her father and mother. There is no history of Colon cancer. Allergies  Allergen Reactions  . Prilosec [Omeprazole] Other (See Comments)    Chest pain  . Miconazole Nitrate Hives    REACTION: hives  . Prednisone Swelling    Tongue/lip swelling.  . Augmentin [Amoxicillin-Pot  Clavulanate] Hives, Itching and Rash  . Doxycycline Other (See Comments)    REACTION: gi upset   Current Outpatient Prescriptions on File Prior to Visit  Medication Sig Dispense Refill  . acetaminophen (TYLENOL) 325 MG tablet Take 2 tablets (650 mg total) by mouth every 4 (four) hours as needed for headache or mild pain.    [name] allopurinol (ZYLOPRIM) 100 MG tablet Take 1 tablet (100 mg total) by mouth daily. 90 tablet 3  . aspirin EC 81 MG tablet Take 1 tablet (81 mg total) by mouth daily. For 1 month.  Then resume taking 4 times a week (tue thur sat sun) (Patient taking differently: Take 81 mg by mouth as directed. 4 times a week (tue thur sat sun))      . atenolol (TENORMIN) 25 MG tablet Take 1 tablet (25 mg total) by mouth 2 (two) times daily. 180 tablet 3  . HYDROcodone-acetaminophen (NORCO) 10-325 MG tablet Take 1 tablet by mouth every 6 (six) hours as needed. 30 tablet 0  . hydrOXYzine (ATARAX/VISTARIL) 10 MG tablet TAKE 1 TABLET BY MOUTH THREE TIMES DAILY AS NEEDED FOR ITCHING 90 tablet 0  . levofloxacin (LEVAQUIN) 250 MG tablet Take 1 tablet (250 mg total) by mouth daily. 10 tablet 0  . metFORMIN (GLUCOPHAGE-XR) 500 MG 24 hr tablet Take 4 tablets (2,000 mg total) by mouth daily with breakfast.    . nitroGLYCERIN (NITROSTAT) 0.4 MG SL tablet Place 1 tablet (0.4 mg total) under the tongue every 5 (five) minutes as needed. (Patient taking differently: Place 0.4 mg under the tongue every 5 (five) minutes as needed for chest pain. ) 25 tablet 1  . SYNTHROID 150 MCG tablet TAKE 1 TABLET BY MOUTH EVERY DAY 90 tablet 3  . tiZANidine (ZANAFLEX) 4 MG tablet Take 1 tablet (4 mg total) by mouth every 6 (six) hours as needed for muscle spasms. 40 tablet 1  . traMADol (ULTRAM) 50 MG tablet Take 1 tablet (50 mg total) by mouth every 6 (six) hours as needed. 60 tablet 1   No current facility-administered medications on file prior to visit.   Review of Systems  Constitutional: Negative for unusual diaphoresis or night sweats HENT: Negative for ringing in ear or discharge Eyes: Negative for double vision or worsening visual disturbance.  Respiratory: Negative for choking and stridor.   Gastrointestinal: Negative for vomiting or other signifcant bowel change Genitourinary: Negative for hematuria or change in urine volume.  Musculoskeletal: Negative for other MSK pain or swelling Skin: Negative for color change and worsening wound.  Neurological: Negative for tremors and numbness other than noted  Psychiatric/Behavioral: Negative for decreased concentration or agitation other than above       Objective:   Physical Exam VS noted,  Constitutional: Pt  appears in no significant distress HENT: Head: NCAT.  Right Ear: External ear normal.  Left Ear: External ear normal.  Eyes: . Pupils are equal, round, and reactive to light. Conjunctivae and EOM are normal Neck: Normal range of motion. Neck supple.  Cardiovascular: Normal rate and regular rhythm.   Pulmonary/Chest: Effort normal and breath sounds without rales or wheezing.  Left foot with 2-3+ red/tender/swelling starting at 5th MTP area with some swelling mostly lateral foot extending to the ankle Neurological: Pt is alert. Not confused , motor grossly intact Skin: Skin is warm. No rash, no LE edema Psychiatric: Pt behavior is normal. No agitation.     Assessment & Plan:

## 2015-02-04 DIAGNOSIS — M545 Low back pain: Secondary | ICD-10-CM | POA: Diagnosis not present

## 2015-02-10 DIAGNOSIS — M545 Low back pain: Secondary | ICD-10-CM | POA: Diagnosis not present

## 2015-02-10 DIAGNOSIS — M4696 Unspecified inflammatory spondylopathy, lumbar region: Secondary | ICD-10-CM | POA: Diagnosis not present

## 2015-02-16 DIAGNOSIS — M47816 Spondylosis without myelopathy or radiculopathy, lumbar region: Secondary | ICD-10-CM | POA: Diagnosis not present

## 2015-02-19 DIAGNOSIS — M47816 Spondylosis without myelopathy or radiculopathy, lumbar region: Secondary | ICD-10-CM | POA: Diagnosis not present

## 2015-02-20 ENCOUNTER — Encounter: Payer: Self-pay | Admitting: Family

## 2015-02-26 ENCOUNTER — Encounter: Payer: Self-pay | Admitting: Family

## 2015-02-26 ENCOUNTER — Ambulatory Visit (HOSPITAL_COMMUNITY)
Admission: RE | Admit: 2015-02-26 | Discharge: 2015-02-26 | Disposition: A | Discharge status: Home / Self Care | Payer: Medicare Other | Source: Ambulatory Visit | Attending: Family | Admitting: Family

## 2015-02-26 ENCOUNTER — Ambulatory Visit (INDEPENDENT_AMBULATORY_CARE_PROVIDER_SITE_OTHER): Payer: Medicare Other | Admitting: Family

## 2015-02-26 VITALS — BP 115/80 | HR 85 | Ht 66.0 in | Wt 218.5 lb

## 2015-02-26 DIAGNOSIS — E119 Type 2 diabetes mellitus without complications: Secondary | ICD-10-CM | POA: Diagnosis not present

## 2015-02-26 DIAGNOSIS — I714 Abdominal aortic aneurysm, without rupture, unspecified: Secondary | ICD-10-CM

## 2015-02-26 DIAGNOSIS — F17200 Nicotine dependence, unspecified, uncomplicated: Secondary | ICD-10-CM | POA: Insufficient documentation

## 2015-02-26 DIAGNOSIS — E785 Hyperlipidemia, unspecified: Secondary | ICD-10-CM | POA: Insufficient documentation

## 2015-02-26 DIAGNOSIS — I10 Essential (primary) hypertension: Secondary | ICD-10-CM | POA: Diagnosis not present

## 2015-02-26 NOTE — Addendum Note (Signed)
Addended by: Dorthula Rue L on: 02/26/2015 04:55 PM   Modules accepted: Orders

## 2015-02-26 NOTE — Progress Notes (Signed)
[facility] [place]  Established Abdominal Aortic Aneurysm  History of Present Illness  [name] is a 70 y.o. (02-Jan-1946) female patient of Dr. Donnetta Hutching. The patient presents today for continued followup of known infrarenal abdominal aortic aneurysm. Previous studies demonstrate an AAA, measuring 4.7 cm.    She does have some chronic degenerative disc disease with back pain, with recent exacerbation, relieved by sitting, but no new abdominal pain. She is receiving ESI's for lumbar spine issues. She is suffering from gout pain in her left foot currently.    The patient denies claudication in legs with walking, denies non healing wounds. The patient denies history of stroke or TIA symptoms. She had a cardiac stent placed February 2016. Takes ASA 4 days/week. Her cardiologist is Dr. Burt Knack, pt states she tried Brilinta as prescribed by Dr. Burt Knack, also tried Plavix, states both medications "were messin' with me" and pt stopped taking, pt states she told the cardiologist office that she stopped Brilinta.   Pt states her cholesterol is good, not taking a statin.  Pt Diabetic: Yes, A1C August 2015 was 7.0, pt does not know what her more recent A1C was Pt smoker: goes through a pack in 3-4 months, states she only stress smokes    Past Medical History  Diagnosis Date  . Coronary artery disease     a. s/p inferior MI 1998->PCI of RCA. b. S/p prior PCI of the OM1 and LAD;  c. 05/2013 Cath/PCI: LM nl, LAD 90p (3.0x22 Resolute DES), 80m w/ patent stent, Diag 40p, 44m, LCX patent OM1 stent, RCA 50p, 16m, EF 65%.  . Hyperlipidemia   . Hypertension   . Obesity, unspecified   . Hypothyroidism   . Diabetes mellitus     TYPE II  . Anxiety state, unspecified   . Depressive disorder, not elsewhere classified   . Degeneration of lumbar or lumbosacral intervertebral disc   . Allergic rhinitis, cause unspecified   . Lower GI bleed 06/2010    Diverticular bleed  . Chronic  LBP   . AAA (abdominal aortic aneurysm) ([place])     a. 4.7cm in 08/2012. Followed by [facility].  . Gout 08/22/2013  . Noncompliance with medications 02/26/2014   Past Surgical History  Procedure Laterality Date  . Total abdominal hysterectomy    . Lumbar fusion  01/2007    DR. Patrice Paradise...3-LEVEL WITH FIXATION  . Parathyroidectomy    . Thyroidectomy    . Cardiac catheterization      PCI OF BOTH THE CIRCUMFLEX AND LEFT ANTERIOR DESCENDING ARTERY  . Cesarean section    . Heart stent  04-2010  and  Jun 07, 2013    X 3  . Spine surgery    . Left heart catheterization with coronary angiogram N/A 06/07/2013    Procedure: LEFT HEART CATHETERIZATION WITH CORONARY ANGIOGRAM;  Surgeon: [name], MD;  Location[facility] CATH LAB;  Service: Cardiovascular;  Laterality: N/A;  . Left heart catheterization with coronary angiogram N/A 02/25/2014    Procedure: LEFT HEART CATHETERIZATION WITH CORONARY ANGIOGRAM;  Surgeon: Troy Sine, MD;  Location: [facility] CATH LAB;  Service: Cardiovascular;  Laterality: N/A;   Social History Social History   Social History  . Marital Status: Divorced    Spouse Name: N/A  . Number of Children: 3  . Years of Education: N/A   Occupational History  . [employer]MIN [employer]  .     Social History Main  Topics  . Smoking status: Former Smoker -- 30 years    Types: Cigarettes    Quit date: 05/31/1986  . Smokeless tobacco: Never Used  . Alcohol Use: No  . Drug Use: No  . Sexual Activity: Not on file   Other Topics Concern  . Not on file   Social History Narrative   DIVORCED   3 CHILDREN   PATIENT SIGNED A DESIGNATED PARTY RELEASE TO ALLOW HER DAUGHTER, TRAMAINE Jeanbaptiste, TO HAVE ACCESS TO HER MEDICAL RECORDS/INFORMATION. [name], May 04, 2009 @ 3:27 PM   Smokes cigarettes on rare occasions.   Family History Family History  Problem Relation Age of Onset  . Heart attack Mother 1    s/p D&C-CARDIAC ARREST 1966  . Heart disease Mother   .  Heart attack Father 69    1978 WITH MI  . Heart disease Father   . Colon cancer Neg Hx   . Diabetes Brother   . Diabetes Maternal Aunt   . Arthritis Maternal Aunt   . Cancer Maternal Aunt     Current Outpatient Prescriptions on File Prior to Visit  Medication Sig Dispense Refill  . acetaminophen (TYLENOL) 325 MG tablet Take 2 tablets (650 mg total) by mouth every 4 (four) hours as needed for headache or mild pain.    [name] allopurinol (ZYLOPRIM) 100 MG tablet Take 1 tablet (100 mg total) by mouth daily. 90 tablet 3  . aspirin EC 81 MG tablet Take 1 tablet (81 mg total) by mouth daily. For 1 month.  Then resume taking 4 times a week (tue thur sat sun) (Patient taking differently: Take 81 mg by mouth as directed. 4 times a week (tue thur sat sun))    . atenolol (TENORMIN) 25 MG tablet Take 1 tablet (25 mg total) by mouth 2 (two) times daily. 180 tablet 3  . colchicine 0.6 MG tablet Take 1 per hour for acute gout attack until improved, or until diarrhea 60 tablet 5  . HYDROcodone-acetaminophen (NORCO) 10-325 MG tablet Take 1 tablet by mouth every 6 (six) hours as needed. 30 tablet 0  . hydrOXYzine (ATARAX/VISTARIL) 10 MG tablet TAKE 1 TABLET BY MOUTH THREE TIMES DAILY AS NEEDED FOR ITCHING 90 tablet 0  . levofloxacin (LEVAQUIN) 250 MG tablet Take 1 tablet (250 mg total) by mouth daily. 10 tablet 0  . metFORMIN (GLUCOPHAGE-XR) 500 MG 24 hr tablet Take 4 tablets (2,000 mg total) by mouth daily with breakfast.    . nitroGLYCERIN (NITROSTAT) 0.4 MG SL tablet Place 1 tablet (0.4 mg total) under the tongue every 5 (five) minutes as needed. (Patient taking differently: Place 0.4 mg under the tongue every 5 (five) minutes as needed for chest pain. ) 25 tablet 1  . predniSONE (DELTASONE) 10 MG tablet 3 tabs by mouth per day for 3 days,2tabs per day for 3 days,1tab per day for 3 days 18 tablet 0  . SYNTHROID 150 MCG tablet TAKE 1 TABLET BY MOUTH EVERY DAY 90 tablet 3  . tiZANidine (ZANAFLEX) 4 MG tablet Take 1  tablet (4 mg total) by mouth every 6 (six) hours as needed for muscle spasms. 40 tablet 1  . traMADol (ULTRAM) 50 MG tablet Take 1 tablet (50 mg total) by mouth every 6 (six) hours as needed. 60 tablet 1   No current facility-administered medications on file prior to visit.   Allergies  Allergen Reactions  . Prilosec [Omeprazole] Other (See Comments)    Chest pain  . Miconazole Nitrate Hives  REACTION: hives  . Prednisone Swelling    Tongue/lip swelling.  . Augmentin [Amoxicillin-Pot Clavulanate] Hives, Itching and Rash  . Doxycycline Other (See Comments)    REACTION: gi upset    ROS: See HPI for pertinent positives and negatives.  Physical Examination  Filed Vitals:   02/26/15 0829  BP: 115/80  Pulse: 85  Height: 5\' 6"  (1.676 m)  Weight: 218 lb 8 oz (99.111 kg)  SpO2: 96%   Body mass index is 35.28 kg/(m^2).  General: A&O x 3, WD, obese female.  Pulmonary: Sym exp, good air movt, CTAB, no rales, rhonchi, or wheezing.  Cardiac: RRR, Nl S1, S2, no detected murmur.   Carotid Bruits Left Right   Negative Negative  Aorta is not palpable Radial pulses are 2+ palpable and =   VASCULAR EXAM:     LE Pulses LEFT RIGHT   FEMORAL 2+ palpable 2+ palpable    POPLITEAL not palpable  not palpable   POSTERIOR TIBIAL 1+ palpable  1+ palpable    DORSALIS PEDIS  ANTERIOR TIBIAL 2+ palpable  2+ palpable     Gastrointestinal: soft, NTND, -G/R, - HSM, - palpable masses, - CVAT B.  Musculoskeletal: M/S 4/5 throughout, Extremities without ischemic changes. Left foot is slightly swollen and tender (gout per pt)  Neurologic: CN 2-12 intact, Pain and light touch intact in extremities are intact except, Motor exam as listed above.                 Non-Invasive Vascular Imaging  AAA Duplex (02/26/2015)  Previous size: 4.7 cm (Date: 07/15/2014)  Current size:  4.4 cm (Date: 02/26/2015)  Medical Decision Making  The patient is a 70 y.o. female who presents with asymptomatic AAA with no increase in size.   Based on this patient's exam and diagnostic studies, the patient will follow up in 6 months  with the following studies: AAA duplex.  Consideration for repair of AAA would be made when the size is 5.5 cm, growth > 1 cm/yr, and symptomatic status.  I emphasized the importance of maximal medical management including strict control of blood pressure, blood glucose, and lipid levels, antiplatelet agents, obtaining regular exercise, and cessation of smoking.   The patient was given information about AAA including signs, symptoms, treatment, and how to minimize the risk of enlargement and rupture of aneurysms.    The patient was advised to call 911 should the patient experience sudden onset abdominal or back pain.   Thank you for allowing [place] to participate in this patient's care.  Clemon Chambers, RN, MSN, FNP-C [facility] [place] Office: [facility] Physician: Oneida Alar  02/26/2015, 8:57 AM

## 2015-02-26 NOTE — Patient Instructions (Signed)
Abdominal Aortic Aneurysm An aneurysm is a weakened or damaged part of an artery wall that bulges from the normal force of blood pumping through the body. An abdominal aortic aneurysm is an aneurysm that occurs in the lower part of the aorta, the main artery of the body.  The major concern with an abdominal aortic aneurysm is that it can enlarge and burst (rupture) or blood can flow between the layers of the wall of the aorta through a tear (aorticdissection). Both of these conditions can cause bleeding inside the body and can be life threatening unless diagnosed and treated promptly. CAUSES  The exact cause of an abdominal aortic aneurysm is unknown. Some contributing factors are:   A hardening of the arteries caused by the buildup of fat and other substances in the lining of a blood vessel (arteriosclerosis).  Inflammation of the walls of an artery (arteritis).   Connective tissue diseases, such as Marfan syndrome.   Abdominal trauma.   An infection, such as syphilis or staphylococcus, in the wall of the aorta (infectious aortitis) caused by bacteria. RISK FACTORS  Risk factors that contribute to an abdominal aortic aneurysm may include:  Age older than 60 years.   High blood pressure (hypertension).  Female gender.  Ethnicity (white race).  Obesity.  Family history of aneurysm (first degree relatives only).  Tobacco use. PREVENTION  The following healthy lifestyle habits may help decrease your risk of abdominal aortic aneurysm:  Quitting smoking. Smoking can raise your blood pressure and cause arteriosclerosis.  Limiting or avoiding alcohol.  Keeping your blood pressure, blood sugar level, and cholesterol levels within normal limits.  Decreasing your salt intake. In somepeople, too much salt can raise blood pressure and increase your risk of abdominal aortic aneurysm.  Eating a diet low in saturated fats and cholesterol.  Increasing your fiber intake by including  whole grains, vegetables, and fruits in your diet. Eating these foods may help lower blood pressure.  Maintaining a healthy weight.  Staying physically active and exercising regularly. SYMPTOMS  The symptoms of abdominal aortic aneurysm may vary depending on the size and rate of growth of the aneurysm.Most grow slowly and do not have any symptoms. When symptoms do occur, they may include:  Pain (abdomen, side, lower back, or groin). The pain may vary in intensity. A sudden onset of severe pain may indicate that the aneurysm has ruptured.  Feeling full after eating only small amounts of food.  Nausea or vomiting or both.  Feeling a pulsating lump in the abdomen.  Feeling faint or passing out. DIAGNOSIS  Since most unruptured abdominal aortic aneurysms have no symptoms, they are often discovered during diagnostic exams for other conditions. An aneurysm may be found during the following procedures:  Ultrasonography (A one-time screening for abdominal aortic aneurysm by ultrasonography is also recommended for all men aged 65-75 years who have ever smoked).  X-ray exams.  A computed tomography (CT).  Magnetic resonance imaging (MRI).  Angiography or arteriography. TREATMENT  Treatment of an abdominal aortic aneurysm depends on the size of your aneurysm, your age, and risk factors for rupture. Medication to control blood pressure and pain may be used to manage aneurysms smaller than 6 cm. Regular monitoring for enlargement may be recommended by your caregiver if:  The aneurysm is 3-4 cm in size (an annual ultrasonography may be recommended).  The aneurysm is 4-4.5 cm in size (an ultrasonography every 6 months may be recommended).  The aneurysm is larger than 4.5 cm in   size (your caregiver may ask that you be examined by a vascular surgeon). If your aneurysm is larger than 6 cm, surgical repair may be recommended. There are two main methods for repair of an aneurysm:   Endovascular  repair (a minimally invasive surgery). This is done most often.  Open repair. This method is used if an endovascular repair is not possible.   This information is not intended to replace advice given to you by your health care provider. Make sure you discuss any questions you have with your health care provider.   Document Released: 10/13/2004 Document Revised: 04/30/2012 Document Reviewed: 02/03/2012 Elsevier Interactive Patient Education 2016 [employer]     Smoking Cessation, Tips for Success If you are ready to quit smoking, congratulations! You have chosen to help yourself be healthier. Cigarettes bring nicotine, tar, carbon monoxide, and other irritants into your body. Your lungs, heart, and blood vessels will be able to work better without these poisons. There are many different ways to quit smoking. Nicotine gum, nicotine patches, a nicotine inhaler, or nicotine nasal spray can help with physical craving. Hypnosis, support groups, and medicines help break the habit of smoking. WHAT THINGS CAN I DO TO MAKE QUITTING EASIER?  Here are some tips to help you quit for good:  Pick a date when you will quit smoking completely. Tell all of your friends and family about your plan to quit on that date.  Do not try to slowly cut down on the number of cigarettes you are smoking. Pick a quit date and quit smoking completely starting on that day.  Throw away all cigarettes.   Clean and remove all ashtrays from your home, work, and car.  On a card, write down your reasons for quitting. Carry the card with you and read it when you get the urge to smoke.  Cleanse your body of nicotine. Drink enough water and fluids to keep your urine clear or pale yellow. Do this after quitting to flush the nicotine from your body.  Learn to predict your moods. Do not let a bad situation be your excuse to have a cigarette. Some situations in your life might tempt you into wanting a cigarette.  Never have  "just one" cigarette. It leads to wanting another and another. Remind yourself of your decision to quit.  Change habits associated with smoking. If you smoked while driving or when feeling stressed, try other activities to replace smoking. Stand up when drinking your coffee. Brush your teeth after eating. Sit in a different chair when you read the paper. Avoid alcohol while trying to quit, and try to drink fewer caffeinated beverages. Alcohol and caffeine may urge you to smoke.  Avoid foods and drinks that can trigger a desire to smoke, such as sugary or spicy foods and alcohol.  Ask people who smoke not to smoke around you.  Have something planned to do right after eating or having a cup of coffee. For example, plan to take a walk or exercise.  Try a relaxation exercise to calm you down and decrease your stress. Remember, you may be tense and nervous for the first 2 weeks after you quit, but this will pass.  Find new activities to keep your hands busy. Play with a pen, coin, or rubber band. Doodle or draw things on paper.  Brush your teeth right after eating. This will help cut down on the craving for the taste of tobacco after meals. You can also try mouthwash.   Use   oral substitutes in place of cigarettes. Try using lemon drops, carrots, cinnamon sticks, or chewing gum. Keep them handy so they are available when you have the urge to smoke.  When you have the urge to smoke, try deep breathing.  Designate your home as a nonsmoking area.  If you are a heavy smoker, ask your health care provider about a prescription for nicotine chewing gum. It can ease your withdrawal from nicotine.  Reward yourself. Set aside the cigarette money you save and buy yourself something nice.  Look for support from others. Join a support group or smoking cessation program. Ask someone at home or at work to help you with your plan to quit smoking.  Always ask yourself, "Do I need this cigarette or is this just  a reflex?" Tell yourself, "Today, I choose not to smoke," or "I do not want to smoke." You are reminding yourself of your decision to quit.  Do not replace cigarette smoking with electronic cigarettes (commonly called e-cigarettes). The safety of e-cigarettes is unknown, and some may contain harmful chemicals.  If you relapse, do not give up! Plan ahead and think about what you will do the next time you get the urge to smoke. HOW WILL I FEEL WHEN I QUIT SMOKING? You may have symptoms of withdrawal because your body is used to nicotine (the addictive substance in cigarettes). You may crave cigarettes, be irritable, feel very hungry, cough often, get headaches, or have difficulty concentrating. The withdrawal symptoms are only temporary. They are strongest when you first quit but will go away within 10-14 days. When withdrawal symptoms occur, stay in control. Think about your reasons for quitting. Remind yourself that these are signs that your body is healing and getting used to being without cigarettes. Remember that withdrawal symptoms are easier to treat than the major diseases that smoking can cause.  Even after the withdrawal is over, expect periodic urges to smoke. However, these cravings are generally short lived and will go away whether you smoke or not. Do not smoke! WHAT RESOURCES ARE AVAILABLE TO HELP ME QUIT SMOKING? Your health care provider can direct you to community resources or hospitals for support, which may include:  Group support.  Education.  Hypnosis.  Therapy.   This information is not intended to replace advice given to you by your health care provider. Make sure you discuss any questions you have with your health care provider.   Document Released: 10/02/2003 Document Revised: 01/24/2014 Document Reviewed: 06/21/2012 Elsevier Interactive Patient Education 2016 [employer]    Steps to Quit Smoking  Smoking tobacco can be harmful to your health and can affect  almost every organ in your body. Smoking puts you, and those around you, at risk for developing many serious chronic diseases. Quitting smoking is difficult, but it is one of the best things that you can do for your health. It is never too late to quit. WHAT ARE THE BENEFITS OF QUITTING SMOKING? When you quit smoking, you lower your risk of developing serious diseases and conditions, such as:  Lung cancer or lung disease, such as COPD.  Heart disease.  Stroke.  Heart attack.  Infertility.  Osteoporosis and bone fractures. Additionally, symptoms such as coughing, wheezing, and shortness of breath may get better when you quit. You may also find that you get sick less often because your body is stronger at fighting off colds and infections. If you are pregnant, quitting smoking can help to reduce your chances of having   a baby of low birth weight. HOW DO I GET READY TO QUIT? When you decide to quit smoking, create a plan to make sure that you are successful. Before you quit:  Pick a date to quit. Set a date within the next two weeks to give you time to prepare.  Write down the reasons why you are quitting. Keep this list in places where you will see it often, such as on your bathroom mirror or in your car or wallet.  Identify the people, places, things, and activities that make you want to smoke (triggers) and avoid them. Make sure to take these actions:  Throw away all cigarettes at home, at work, and in your car.  Throw away smoking accessories, such as ashtrays and lighters.  Clean your car and make sure to empty the ashtray.  Clean your home, including curtains and carpets.  Tell your family, friends, and coworkers that you are quitting. Support from your loved ones can make quitting easier.  Talk with your health care provider about your options for quitting smoking.  Find out what treatment options are covered by your health insurance. WHAT STRATEGIES CAN I USE TO QUIT  SMOKING?  Talk with your healthcare provider about different strategies to quit smoking. Some strategies include:  Quitting smoking altogether instead of gradually lessening how much you smoke over a period of time. Research shows that quitting "cold turkey" is more successful than gradually quitting.  Attending in-person counseling to help you build problem-solving skills. You are more likely to have success in quitting if you attend several counseling sessions. Even short sessions of 10 minutes can be effective.  Finding resources and support systems that can help you to quit smoking and remain smoke-free after you quit. These resources are most helpful when you use them often. They can include:  Online chats with a counselor.  Telephone quitlines.  Printed self-help materials.  Support groups or group counseling.  Text messaging programs.  Mobile phone applications.  Taking medicines to help you quit smoking. (If you are pregnant or breastfeeding, talk with your health care provider first.) Some medicines contain nicotine and some do not. Both types of medicines help with cravings, but the medicines that include nicotine help to relieve withdrawal symptoms. Your health care provider may recommend:  Nicotine patches, gum, or lozenges.  Nicotine inhalers or sprays.  Non-nicotine medicine that is taken by mouth. Talk with your health care provider about combining strategies, such as taking medicines while you are also receiving in-person counseling. Using these two strategies together makes you more likely to succeed in quitting than if you used either strategy on its own. If you are pregnant or breastfeeding, talk with your health care provider about finding counseling or other support strategies to quit smoking. Do not take medicine to help you quit smoking unless told to do so by your health care provider. WHAT THINGS CAN I DO TO MAKE IT EASIER TO QUIT? Quitting smoking might feel  overwhelming at first, but there is a lot that you can do to make it easier. Take these important actions:  Reach out to your family and friends and ask that they support and encourage you during this time. Call telephone quitlines, reach out to support groups, or work with a counselor for support.  Ask people who smoke to avoid smoking around you.  Avoid places that trigger you to smoke, such as bars, parties, or smoke-break areas at work.  Spend time around people who do   not smoke.  Lessen stress in your life, because stress can be a smoking trigger for some people. To lessen stress, try:  Exercising regularly.  Deep-breathing exercises.  Yoga.  Meditating.  Performing a body scan. This involves closing your eyes, scanning your body from head to toe, and noticing which parts of your body are particularly tense. Purposefully relax the muscles in those areas.  Download or purchase mobile phone or tablet apps (applications) that can help you stick to your quit plan by providing reminders, tips, and encouragement. There are many free apps, such as QuitGuide from the CDC (Centers for Disease Control and Prevention). You can find other support for quitting smoking (smoking cessation) through smokefree.gov and other websites. HOW WILL I FEEL WHEN I QUIT SMOKING? Within the first 24 hours of quitting smoking, you may start to feel some withdrawal symptoms. These symptoms are usually most noticeable 2-3 days after quitting, but they usually do not last beyond 2-3 weeks. Changes or symptoms that you might experience include:  Mood swings.  Restlessness, anxiety, or irritation.  Difficulty concentrating.  Dizziness.  Strong cravings for sugary foods in addition to nicotine.  Mild weight gain.  Constipation.  Nausea.  Coughing or a sore throat.  Changes in how your medicines work in your body.  A depressed mood.  Difficulty sleeping (insomnia). After the first 2-3 weeks of  quitting, you may start to notice more positive results, such as:  Improved sense of smell and taste.  Decreased coughing and sore throat.  Slower heart rate.  Lower blood pressure.  Clearer skin.  The ability to breathe more easily.  Fewer sick days. Quitting smoking is very challenging for most people. Do not get discouraged if you are not successful the first time. Some people need to make many attempts to quit before they achieve long-term success. Do your best to stick to your quit plan, and talk with your health care provider if you have any questions or concerns.   This information is not intended to replace advice given to you by your health care provider. Make sure you discuss any questions you have with your health care provider.   Document Released: 12/28/2000 Document Revised: 05/20/2014 Document Reviewed: 05/20/2014 Elsevier Interactive Patient Education 2016 [employer]  

## 2015-03-14 ENCOUNTER — Ambulatory Visit (INDEPENDENT_AMBULATORY_CARE_PROVIDER_SITE_OTHER): Payer: Medicare Other | Admitting: Internal Medicine

## 2015-03-14 ENCOUNTER — Encounter: Payer: Self-pay | Admitting: Internal Medicine

## 2015-03-14 VITALS — BP 112/70 | HR 91 | Temp 98.2°F | Resp 18 | Ht 66.0 in | Wt 218.5 lb

## 2015-03-14 DIAGNOSIS — G8929 Other chronic pain: Secondary | ICD-10-CM

## 2015-03-14 DIAGNOSIS — M545 Low back pain, unspecified: Secondary | ICD-10-CM

## 2015-03-14 DIAGNOSIS — I2510 Atherosclerotic heart disease of native coronary artery without angina pectoris: Secondary | ICD-10-CM

## 2015-03-14 DIAGNOSIS — I10 Essential (primary) hypertension: Secondary | ICD-10-CM | POA: Diagnosis not present

## 2015-03-14 NOTE — Progress Notes (Signed)
Subjective:    Patient ID: [name], female    DOB: [birth year], 70 y.o.   MRN: [id number]  HPI  70 year old patient who has multiple medical problems including essential hypertension and diabetes.  She also has a history of chronic low back pain which has required analgesics.  For the past 2 days.  She is described right ear pain with sore throat.  She describes some sinus congestion and postnasal drip.  She has minimal nonproductive cough.  She has a prior history of tobacco use.  No fever.  No wheezing or chest pain.  Lab Results  Component Value Date   HGBA1C 7.0* 08/22/2013    Past Medical History  Diagnosis Date  . Coronary artery disease     a. s/p inferior MI 1998->PCI of RCA. b. S/p prior PCI of the OM1 and LAD;  c. 05/2013 Cath/PCI: LM nl, LAD 90p (3.0x22 Resolute DES), 19m w/ patent stent, Diag 40p, 3m, LCX patent OM1 stent, RCA 50p, 60m, EF 65%.  . Hyperlipidemia   . Hypertension   . Obesity, unspecified   . Hypothyroidism   . Diabetes mellitus     TYPE II  . Anxiety state, unspecified   . Depressive disorder, not elsewhere classified   . Degeneration of lumbar or lumbosacral intervertebral disc   . Allergic rhinitis, cause unspecified   . Lower GI bleed 06/2010    Diverticular bleed  . Chronic LBP   . AAA (abdominal aortic aneurysm) ([place])     a. 4.7cm in 08/2012. Followed by [facility].  . Gout 08/22/2013  . Noncompliance with medications 02/26/2014    Social History   Social History  . Marital Status: Divorced    Spouse Name: N/A  . Number of Children: 3  . Years of Education: N/A   Occupational History  . [employer]    [employer]MIN SUPPORT  .     Social History Main Topics  . Smoking status: Former Smoker -- 30 years    Types: Cigarettes    Quit date: 05/31/1986  . Smokeless tobacco: Never Used  . Alcohol Use: No  . Drug Use: No  . Sexual Activity: Not on file   Other Topics Concern  . Not on file   Social History Narrative   DIVORCED   3 CHILDREN   PATIENT SIGNED A DESIGNATED PARTY RELEASE TO ALLOW HER DAUGHTER, TRAMAINE Branch, TO HAVE ACCESS TO HER MEDICAL RECORDS/INFORMATION. [name], May 04, 2009 @ 3:27 PM   Smokes cigarettes on rare occasions.    Past Surgical History  Procedure Laterality Date  . Total abdominal hysterectomy    . Lumbar fusion  01/2007    DR. Patrice Paradise...3-LEVEL WITH FIXATION  . Parathyroidectomy    . Thyroidectomy    . Cardiac catheterization      PCI OF BOTH THE CIRCUMFLEX AND LEFT ANTERIOR DESCENDING ARTERY  . Cesarean section    . Heart stent  04-2010  and  Jun 07, 2013    X 3  . Spine surgery    . Left heart catheterization with coronary angiogram N/A 06/07/2013    Procedure: LEFT HEART CATHETERIZATION WITH CORONARY ANGIOGRAM;  Surgeon: [name], MD;  Location[facility] CATH LAB;  Service: Cardiovascular;  Laterality: N/A;  . Left heart catheterization with coronary angiogram N/A 02/25/2014    Procedure: LEFT HEART CATHETERIZATION WITH CORONARY ANGIOGRAM;  Surgeon: Troy Sine, MD;  Location: [facility] CATH LAB;  Service: Cardiovascular;  Laterality: N/A;  Family History  Problem Relation Age of Onset  . Heart attack Mother 72    s/p D&C-CARDIAC ARREST 1966  . Heart disease Mother   . Heart attack Father 89    1978 WITH MI  . Heart disease Father   . Colon cancer Neg Hx   . Diabetes Brother   . Diabetes Maternal Aunt   . Arthritis Maternal Aunt   . Cancer Maternal Aunt     Allergies  Allergen Reactions  . Prilosec [Omeprazole] Other (See Comments)    Chest pain  . Miconazole Nitrate Hives    REACTION: hives  . Prednisone Swelling    Tongue/lip swelling.  . Augmentin [Amoxicillin-Pot Clavulanate] Hives, Itching and Rash  . Doxycycline Other (See Comments)    REACTION: gi upset    Current Outpatient Prescriptions on File Prior to Visit  Medication Sig Dispense Refill  . acetaminophen (TYLENOL) 325 MG tablet Take 2 tablets (650 mg total) by mouth every 4 (four)  hours as needed for headache or mild pain.    [name] allopurinol (ZYLOPRIM) 100 MG tablet Take 1 tablet (100 mg total) by mouth daily. 90 tablet 3  . aspirin EC 81 MG tablet Take 1 tablet (81 mg total) by mouth daily. For 1 month.  Then resume taking 4 times a week (tue thur sat sun) (Patient taking differently: Take 81 mg by mouth as directed. 4 times a week (tue thur sat sun))    . atenolol (TENORMIN) 25 MG tablet Take 1 tablet (25 mg total) by mouth 2 (two) times daily. 180 tablet 3  . colchicine 0.6 MG tablet Take 1 per hour for acute gout attack until improved, or until diarrhea 60 tablet 5  . HYDROcodone-acetaminophen (NORCO) 10-325 MG tablet Take 1 tablet by mouth every 6 (six) hours as needed. 30 tablet 0  . hydrOXYzine (ATARAX/VISTARIL) 10 MG tablet TAKE 1 TABLET BY MOUTH THREE TIMES DAILY AS NEEDED FOR ITCHING 90 tablet 0  . metFORMIN (GLUCOPHAGE-XR) 500 MG 24 hr tablet Take 4 tablets (2,000 mg total) by mouth daily with breakfast.    . nitroGLYCERIN (NITROSTAT) 0.4 MG SL tablet Place 1 tablet (0.4 mg total) under the tongue every 5 (five) minutes as needed. (Patient taking differently: Place 0.4 mg under the tongue every 5 (five) minutes as needed for chest pain. ) 25 tablet 1  . SYNTHROID 150 MCG tablet TAKE 1 TABLET BY MOUTH EVERY DAY 90 tablet 3  . tiZANidine (ZANAFLEX) 4 MG tablet Take 1 tablet (4 mg total) by mouth every 6 (six) hours as needed for muscle spasms. 40 tablet 1  . traMADol (ULTRAM) 50 MG tablet Take 1 tablet (50 mg total) by mouth every 6 (six) hours as needed. 60 tablet 1   No current facility-administered medications on file prior to visit.    BP 112/70 mmHg  Pulse 91  Temp(Src) 98.2 F (36.8 C) (Oral)  Resp 18  Ht 5\' 6"  (1.676 m)  Wt 218 lb 8 oz (99.111 kg)  BMI 35.28 kg/m2  SpO2 96%     Review of Systems  Constitutional: Positive for activity change, appetite change and fatigue. Negative for chills and diaphoresis.  HENT: Positive for congestion, ear pain,  postnasal drip and sore throat. Negative for dental problem, hearing loss, rhinorrhea, sinus pressure and tinnitus.   Eyes: Negative for pain, discharge and visual disturbance.  Respiratory: Negative for cough and shortness of breath.   Cardiovascular: Negative for chest pain, palpitations and leg swelling.  Gastrointestinal: Negative  for nausea, vomiting, abdominal pain, diarrhea, constipation, blood in stool and abdominal distention.  Genitourinary: Negative for dysuria, urgency, frequency, hematuria, flank pain, vaginal bleeding, vaginal discharge, difficulty urinating, vaginal pain and pelvic pain.  Musculoskeletal: Negative for joint swelling, arthralgias and gait problem.  Skin: Negative for rash.  Neurological: Negative for dizziness, syncope, speech difficulty, weakness, numbness and headaches.  Hematological: Negative for adenopathy.  Psychiatric/Behavioral: Negative for behavioral problems, dysphoric mood and agitation. The patient is not nervous/anxious.        Objective:   Physical Exam  Constitutional: She is oriented to person, place, and time. She appears well-developed and well-nourished.  HENT:  Head: Normocephalic.  Right Ear: External ear normal.  Left Ear: External ear normal.  Mouth/Throat: Oropharynx is clear and moist.  Oropharynx normal Tympanic membranes well visualized and normal  Subjective tenderness over the right facial area  Eyes: Conjunctivae and EOM are normal. Pupils are equal, round, and reactive to light.  Neck: Normal range of motion. Neck supple. No thyromegaly present.  Cardiovascular: Normal rate, regular rhythm, normal heart sounds and intact distal pulses.   Pulmonary/Chest: Effort normal and breath sounds normal.  Abdominal: Soft. Bowel sounds are normal. She exhibits no mass. There is no tenderness.  Musculoskeletal: Normal range of motion.  Lymphadenopathy:    She has no cervical adenopathy.  Neurological: She is alert and oriented to  person, place, and time.  Skin: Skin is warm and dry. No rash noted.  Psychiatric: She has a normal mood and affect. Her behavior is normal.          Assessment & Plan:   URI with otalgia and sore throat.  Will treat with hydration, decongestants, expectorants Continue analgesics as prescribed Essential hypertension, stable Diabetes

## 2015-03-14 NOTE — Progress Notes (Signed)
Pre-visit discussion using our clinic review tool. No additional management support is needed unless otherwise documented below in the visit note.  

## 2015-03-14 NOTE — Patient Instructions (Addendum)
Acute sinusitis symptoms for less than 10 days are generally not helped by antibiotic therapy.  Use saline irrigation, warm  moist compresses and over-the-counter decongestants only as directed.  Call if there is no improvement in 5 to 7 days, or sooner if you develop increasing pain, fever, or any new symptoms.  HOME CARE INSTRUCTIONS  Drink plenty of water. Water helps thin the mucus so your sinuses can drain more easily.  Use a humidifier.  Inhale steam 3-4 times a day (for example, sit in the bathroom with the shower running).  Apply a warm, moist washcloth to your face 3-4 times a day, or as directed by your health care provider.  Use saline nasal sprays to help moisten and clean your sinuses.  Take medicines only as directed by your health care provider.   SEEK IMMEDIATE MEDICAL CARE IF:  You have increasing pain or severe headaches.  You have nausea, vomiting, or drowsiness.  You have swelling around your face.  You have vision problems.  You have a stiff neck.  You have difficulty breathing.    Please check your hemoglobin A1c every 3 months

## 2015-03-15 ENCOUNTER — Other Ambulatory Visit: Payer: Self-pay | Admitting: Internal Medicine

## 2015-03-23 ENCOUNTER — Ambulatory Visit (INDEPENDENT_AMBULATORY_CARE_PROVIDER_SITE_OTHER)
Admission: RE | Admit: 2015-03-23 | Discharge: 2015-03-23 | Disposition: A | Discharge status: Home / Self Care | Payer: Medicare Other | Source: Ambulatory Visit | Attending: Internal Medicine | Admitting: Internal Medicine

## 2015-03-23 ENCOUNTER — Encounter: Payer: Self-pay | Admitting: Internal Medicine

## 2015-03-23 ENCOUNTER — Ambulatory Visit (INDEPENDENT_AMBULATORY_CARE_PROVIDER_SITE_OTHER): Payer: Medicare Other | Admitting: Internal Medicine

## 2015-03-23 VITALS — BP 136/84 | HR 92 | Temp 98.6°F | Resp 20 | Wt 222.0 lb

## 2015-03-23 DIAGNOSIS — M160 Bilateral primary osteoarthritis of hip: Secondary | ICD-10-CM | POA: Diagnosis not present

## 2015-03-23 DIAGNOSIS — R1030 Lower abdominal pain, unspecified: Secondary | ICD-10-CM

## 2015-03-23 DIAGNOSIS — R1031 Right lower quadrant pain: Secondary | ICD-10-CM | POA: Insufficient documentation

## 2015-03-23 DIAGNOSIS — R1032 Left lower quadrant pain: Secondary | ICD-10-CM

## 2015-03-23 DIAGNOSIS — I2510 Atherosclerotic heart disease of native coronary artery without angina pectoris: Secondary | ICD-10-CM | POA: Diagnosis not present

## 2015-03-23 DIAGNOSIS — H6691 Otitis media, unspecified, right ear: Secondary | ICD-10-CM | POA: Diagnosis not present

## 2015-03-23 DIAGNOSIS — J309 Allergic rhinitis, unspecified: Secondary | ICD-10-CM | POA: Diagnosis not present

## 2015-03-23 MED ORDER — HYDROCODONE-ACETAMINOPHEN 10-325 MG PO TABS: 1.0000 | ORAL_TABLET | Freq: Four times a day (QID) | ORAL | Status: DC | PRN

## 2015-03-23 MED ORDER — LEVOFLOXACIN 250 MG PO TABS: 250.0000 mg | ORAL_TABLET | Freq: Every day | ORAL | Status: DC

## 2015-03-23 MED ORDER — METHYLPREDNISOLONE ACETATE 80 MG/ML IJ SUSP
80.0000 mg | Freq: Once | INTRAMUSCULAR | Status: AC
  Administered 2015-03-23: 80 mg via INTRAMUSCULAR

## 2015-03-23 NOTE — Progress Notes (Signed)
Subjective:    Patient ID: [name], female    DOB: September 06, 1945, 70 y.o.   MRN: [id number]  HPI  Here unfortunately with persistent worsening right ear pain, now with reduced hearing, dizziness and feeling feverish despite recent eval and conservative management. Does have several wks ongoing nasal allergy symptoms with clearish congestion, itch and sneezing, without fever, pain, ST, cough, swelling or wheezing.  Pt denies chest pain, increased sob or doe, wheezing, orthopnea, PND, increased LE swelling, palpitations, dizziness or syncope.  Pt denies new neurological symptoms such as new headache, or facial or extremity weakness or numbness   Pt denies polydipsia, polyuria.  Incidentally also with left groin pain, sudden onset without seeming obvious cause, x 24 hrs, much worse to ambulate, better to sit, no radiation, described as sharp and dull, constant. No recent falls though walks today favoring the left leg Past Medical History  Diagnosis Date  . Coronary artery disease     a. s/p inferior MI 1998->PCI of RCA. b. S/p prior PCI of the OM1 and LAD;  c. 05/2013 Cath/PCI: LM nl, LAD 90p (3.0x22 Resolute DES), 32m w/ patent stent, Diag 40p, 68m, LCX patent OM1 stent, RCA 50p, 69m, EF 65%.  . Hyperlipidemia   . Hypertension   . Obesity, unspecified   . Hypothyroidism   . Diabetes mellitus     TYPE II  . Anxiety state, unspecified   . Depressive disorder, not elsewhere classified   . Degeneration of lumbar or lumbosacral intervertebral disc   . Allergic rhinitis, cause unspecified   . Lower GI bleed 06/2010    Diverticular bleed  . Chronic LBP   . AAA (abdominal aortic aneurysm) ([place])     a. 4.7cm in 08/2012. Followed by [facility].  . Gout 08/22/2013  . Noncompliance with medications 02/26/2014   Past Surgical History  Procedure Laterality Date  . Total abdominal hysterectomy    . Lumbar fusion  01/2007    DR. Patrice Paradise...3-LEVEL WITH FIXATION  . Parathyroidectomy    . Thyroidectomy    .  Cardiac catheterization      PCI OF BOTH THE CIRCUMFLEX AND LEFT ANTERIOR DESCENDING ARTERY  . Cesarean section    . Heart stent  04-2010  and  Jun 07, 2013    X 3  . Spine surgery    . Left heart catheterization with coronary angiogram N/A 06/07/2013    Procedure: LEFT HEART CATHETERIZATION WITH CORONARY ANGIOGRAM;  Surgeon: [name], MD;  Location: [facility] CATH LAB;  Service: Cardiovascular;  Laterality: N/A;  . Left heart catheterization with coronary angiogram N/A 02/25/2014    Procedure: LEFT HEART CATHETERIZATION WITH CORONARY ANGIOGRAM;  Surgeon: Troy Sine, MD;  Location: [facility] CATH LAB;  Service: Cardiovascular;  Laterality: N/A;    reports that she quit smoking about 28 years ago. Her smoking use included Cigarettes. She quit after 30 years of use. She has never used smokeless tobacco. She reports that she does not drink alcohol or use illicit drugs. family history includes Arthritis in her maternal aunt; Cancer in her maternal aunt; Diabetes in her brother and maternal aunt; Heart attack (age of onset: 76) in her mother; Heart attack (age of onset: 24) in her father; Heart disease in her father and mother. There is no history of Colon cancer. Allergies  Allergen Reactions  . Prilosec [Omeprazole] Other (See Comments)    Chest pain  . Miconazole Nitrate Hives    REACTION: hives  . Prednisone Swelling  Tongue/lip swelling.  . Augmentin [Amoxicillin-Pot Clavulanate] Hives, Itching and Rash  . Doxycycline Other (See Comments)    REACTION: gi upset   Current Outpatient Prescriptions on File Prior to Visit  Medication Sig Dispense Refill  . acetaminophen (TYLENOL) 325 MG tablet Take 2 tablets (650 mg total) by mouth every 4 (four) hours as needed for headache or mild pain.    [name] allopurinol (ZYLOPRIM) 100 MG tablet Take 1 tablet (100 mg total) by mouth daily. 90 tablet 3  . aspirin EC 81 MG tablet Take 1 tablet (81 mg total) by mouth daily. For 1 month.  Then resume taking 4  times a week (tue thur sat sun) (Patient taking differently: Take 81 mg by mouth as directed. 4 times a week (tue thur sat sun))    . atenolol (TENORMIN) 25 MG tablet Take 1 tablet (25 mg total) by mouth 2 (two) times daily. 180 tablet 3  . colchicine 0.6 MG tablet Take 1 per hour for acute gout attack until improved, or until diarrhea 60 tablet 5  . hydrOXYzine (ATARAX/VISTARIL) 10 MG tablet TAKE 1 TABLET BY MOUTH THREE TIMES DAILY AS NEEDED FOR ITCHING 90 tablet 0  . metFORMIN (GLUCOPHAGE-XR) 500 MG 24 hr tablet Take 4 tablets (2,000 mg total) by mouth daily with breakfast.    . nitroGLYCERIN (NITROSTAT) 0.4 MG SL tablet Place 1 tablet (0.4 mg total) under the tongue every 5 (five) minutes as needed. (Patient taking differently: Place 0.4 mg under the tongue every 5 (five) minutes as needed for chest pain. ) 25 tablet 1  . SYNTHROID 150 MCG tablet TAKE 1 TABLET BY MOUTH EVERY DAY 90 tablet 0  . tiZANidine (ZANAFLEX) 4 MG tablet Take 1 tablet (4 mg total) by mouth every 6 (six) hours as needed for muscle spasms. 40 tablet 1  . traMADol (ULTRAM) 50 MG tablet Take 1 tablet (50 mg total) by mouth every 6 (six) hours as needed. 60 tablet 1   No current facility-administered medications on file prior to visit.     Review of Systems  Constitutional: Negative for unusual diaphoresis or night sweats HENT: Negative for ringing in ear or discharge Eyes: Negative for double vision or worsening visual disturbance.  Respiratory: Negative for choking and stridor.   Gastrointestinal: Negative for vomiting or other signifcant bowel change Genitourinary: Negative for hematuria or change in urine volume.  Musculoskeletal: Negative for other MSK pain or swelling Skin: Negative for color change and worsening wound.  Neurological: Negative for tremors and numbness other than noted  Psychiatric/Behavioral: Negative for decreased concentration or agitation other than above       Objective:   Physical Exam BP  136/84 mmHg  Pulse 92  Temp(Src) 98.6 F (37 C) (Oral)  Resp 20  Wt 222 lb (100.699 kg)  SpO2 93% VS noted,  Constitutional: Pt appears in no significant distress HENT: Head: NCAT.  Right Ear: External ear normal.  Left Ear: External ear normal.  Bilat tm's with mild erythema.  Max sinus areas non tender.  Pharynx with mild erythema, no exudateEyes: . Pupils are equal, round, and reactive to light. Conjunctivae and EOM are normal Neck: Normal range of motion. Neck supple.  Cardiovascular: Normal rate and regular rhythm.   Pulmonary/Chest: Effort normal and breath sounds without rales or wheezing.  Abd:  Soft, NT, ND, + BS Left inguinal area without mass or swelling, mild tender only + pain with passive left hip flexion to 30 degrees only Neurological: Pt is alert.  Not confused , motor grossly intact Skin: Skin is warm. No rash, no LE edema Psychiatric: Pt behavior is normal. No agitation.     Assessment & Plan:

## 2015-03-23 NOTE — Assessment & Plan Note (Signed)
Mild to mod, for antibx course,  to f/u any worsening symptoms or concerns 

## 2015-03-23 NOTE — Assessment & Plan Note (Signed)
Etiology unclear, for pelvis/hip film, pain control, refer Dr Tamala Julian in this office for further evaluation - ? DJD flare vs other

## 2015-03-23 NOTE — Progress Notes (Signed)
Pre visit review using our clinic review tool, if applicable. No additional management support is needed unless otherwise documented below in the visit note. 

## 2015-03-23 NOTE — Assessment & Plan Note (Signed)
Mild to mod seasonal flare, for depomedrol IM, otfc zyrtec and nasacort,  to f/u any worsening symptoms or concerns

## 2015-03-23 NOTE — Patient Instructions (Signed)
You had the steroid shot today for the allergies  Please take all new medication as prescribed  - the antibiotic, and the pain medication  You can also take Delsym OTC for cough, and/or Mucinex (or it's generic off brand) for congestion, and tylenol as needed for pain.  Please continue all other medications as before, and refills have been done if requested.  Please have the pharmacy call with any other refills you may need.  Please continue your efforts at being more active, low cholesterol diet, and weight control.  You will be contacted regarding the referral for: Dr Tamala Julian in this office for the left groin pain  Please keep your appointments with your specialists as you may have planned  Please go to the XRAY Department in the Basement (go straight as you get off the elevator) for the x-ray testing  You will be contacted by phone if any changes need to be made immediately.  Otherwise, you will receive a letter about your results with an explanation, but please check with MyChart first.  Please remember to sign up for MyChart if you have not done so, as this will be important to you in the future with finding out test results, communicating by private email, and scheduling acute appointments online when needed.

## 2015-03-27 ENCOUNTER — Encounter: Payer: Self-pay | Admitting: Internal Medicine

## 2015-03-27 ENCOUNTER — Ambulatory Visit (INDEPENDENT_AMBULATORY_CARE_PROVIDER_SITE_OTHER): Payer: Medicare Other | Admitting: Internal Medicine

## 2015-03-27 ENCOUNTER — Other Ambulatory Visit: Payer: Self-pay | Admitting: Internal Medicine

## 2015-03-27 ENCOUNTER — Other Ambulatory Visit (INDEPENDENT_AMBULATORY_CARE_PROVIDER_SITE_OTHER): Payer: Medicare Other

## 2015-03-27 VITALS — BP 128/80 | HR 75 | Temp 98.7°F | Resp 20 | Wt 220.0 lb

## 2015-03-27 DIAGNOSIS — E785 Hyperlipidemia, unspecified: Secondary | ICD-10-CM

## 2015-03-27 DIAGNOSIS — E11311 Type 2 diabetes mellitus with unspecified diabetic retinopathy with macular edema: Secondary | ICD-10-CM

## 2015-03-27 DIAGNOSIS — H6691 Otitis media, unspecified, right ear: Secondary | ICD-10-CM

## 2015-03-27 DIAGNOSIS — Z1159 Encounter for screening for other viral diseases: Secondary | ICD-10-CM | POA: Diagnosis not present

## 2015-03-27 DIAGNOSIS — R1030 Lower abdominal pain, unspecified: Secondary | ICD-10-CM

## 2015-03-27 DIAGNOSIS — I2510 Atherosclerotic heart disease of native coronary artery without angina pectoris: Secondary | ICD-10-CM | POA: Diagnosis not present

## 2015-03-27 DIAGNOSIS — R1032 Left lower quadrant pain: Secondary | ICD-10-CM

## 2015-03-27 DIAGNOSIS — I10 Essential (primary) hypertension: Secondary | ICD-10-CM

## 2015-03-27 LAB — HEPATIC FUNCTION PANEL
ALK PHOS: 80 U/L (ref 39–117)
ALT: 22 U/L (ref 0–35)
AST: 21 U/L (ref 0–37)
Albumin: 4.1 g/dL (ref 3.5–5.2)
Bilirubin, Direct: 0.1 mg/dL (ref 0.0–0.3)
Total Bilirubin: 0.5 mg/dL (ref 0.2–1.2)
Total Protein: 8 g/dL (ref 6.0–8.3)

## 2015-03-27 LAB — CBC WITH DIFFERENTIAL/PLATELET
BASOS PCT: 0.4 % (ref 0.0–3.0)
Basophils Absolute: 0 10*3/uL (ref 0.0–0.1)
EOS PCT: 1.7 % (ref 0.0–5.0)
Eosinophils Absolute: 0.2 10*3/uL (ref 0.0–0.7)
HEMATOCRIT: 44.5 % (ref 36.0–46.0)
HEMOGLOBIN: 14.6 g/dL (ref 12.0–15.0)
LYMPHS PCT: 25.1 % (ref 12.0–46.0)
Lymphs Abs: 2.5 10*3/uL (ref 0.7–4.0)
MCHC: 32.9 g/dL (ref 30.0–36.0)
MCV: 86.2 fl (ref 78.0–100.0)
MONOS PCT: 2.9 % — AB (ref 3.0–12.0)
Monocytes Absolute: 0.3 10*3/uL (ref 0.1–1.0)
NEUTROS ABS: 7 10*3/uL (ref 1.4–7.7)
Neutrophils Relative %: 69.9 % (ref 43.0–77.0)
PLATELETS: 327 10*3/uL (ref 150.0–400.0)
RBC: 5.17 Mil/uL — ABNORMAL HIGH (ref 3.87–5.11)
RDW: 14.5 % (ref 11.5–15.5)
WBC: 10 10*3/uL (ref 4.0–10.5)

## 2015-03-27 LAB — URINALYSIS, ROUTINE W REFLEX MICROSCOPIC
BILIRUBIN URINE: NEGATIVE
KETONES UR: NEGATIVE
Leukocytes, UA: NEGATIVE
NITRITE: NEGATIVE
Specific Gravity, Urine: 1.02 (ref 1.000–1.030)
TOTAL PROTEIN, URINE-UPE24: NEGATIVE
Urine Glucose: NEGATIVE
Urobilinogen, UA: 0.2 (ref 0.0–1.0)
pH: 5.5 (ref 5.0–8.0)

## 2015-03-27 LAB — BASIC METABOLIC PANEL
BUN: 11 mg/dL (ref 6–23)
CO2: 28 meq/L (ref 19–32)
Calcium: 9.3 mg/dL (ref 8.4–10.5)
Chloride: 103 mEq/L (ref 96–112)
Creatinine, Ser: 0.91 mg/dL (ref 0.40–1.20)
GFR: 78.69 mL/min (ref >60.00)
GLUCOSE: 124 mg/dL — AB (ref 70–99)
POTASSIUM: 4.1 meq/L (ref 3.5–5.1)
SODIUM: 142 meq/L (ref 135–145)

## 2015-03-27 LAB — LIPID PANEL
CHOLESTEROL: 198 mg/dL (ref 0–200)
HDL: 33.4 mg/dL — AB (ref >39.00)
NonHDL: 164.31
Total CHOL/HDL Ratio: 6
Triglycerides: 229 mg/dL — ABNORMAL HIGH (ref 0.0–149.0)
VLDL: 45.8 mg/dL — AB (ref 0.0–40.0)

## 2015-03-27 LAB — MICROALBUMIN / CREATININE URINE RATIO
Creatinine,U: 190.9 mg/dL
MICROALB UR: 0.7 mg/dL (ref 0.0–1.9)
MICROALB/CREAT RATIO: 0.4 mg/g (ref 0.0–30.0)

## 2015-03-27 LAB — TSH: TSH: 6.39 u[IU]/mL — ABNORMAL HIGH (ref 0.35–4.50)

## 2015-03-27 LAB — LDL CHOLESTEROL, DIRECT: Direct LDL: 131 mg/dL

## 2015-03-27 LAB — HEMOGLOBIN A1C: HEMOGLOBIN A1C: 6.6 % — AB (ref 4.6–6.5)

## 2015-03-27 MED ORDER — ROSUVASTATIN CALCIUM 10 MG PO TABS: 10.0000 mg | ORAL_TABLET | Freq: Every day | ORAL | Status: DC

## 2015-03-27 NOTE — Patient Instructions (Signed)
You had the steroid shot today, and the toradol for pain  OK to take the levaquin for the antibiotic for your infection  Please continue all other medications as before, and refills have been done if requested.  Please have the pharmacy call with any other refills you may need.  Please continue your efforts at being more active, low cholesterol diet, and weight control.  You are otherwise up to date with prevention measures today.  Please keep your appointments with your specialists as you may have planned  Please go to the LAB in the Basement (turn left off the elevator) for the tests to be done today  You will be contacted by phone if any changes need to be made immediately.  Otherwise, you will receive a letter about your results with an explanation, but please check with MyChart first.  Please remember to sign up for MyChart if you have not done so, as this will be important to you in the future with finding out test results, communicating by private email, and scheduling acute appointments online when needed.  Please return in 6 months, or sooner if needed

## 2015-03-27 NOTE — Progress Notes (Signed)
Subjective:    Patient ID: [name], female    DOB: 1945/07/01, 70 y.o.   MRN: [id number]  HPI  Here to f/u, has not yet started the levaquin since she became alarmed after reading the side effects profile, and asks if this is really safe to take.  Still with similar URI symptoms including marked right ear pain not improved, and feverish.  Pt denies chest pain, increased sob or doe, wheezing, orthopnea, PND, increased LE swelling, palpitations, dizziness or syncope. Pt denies new neurological symptoms such as new headache, or facial or extremity weakness or numbness   Pt denies polydipsia, polyuria, or low sugar symptoms such as weakness or confusion improved with po intake.  Pt states overall good compliance with meds, trying to follow lower cholesterol, diabetic diet, wt overall stable but little exercise however.   Pt continues to have recurring LBP without change in severity, bowel or bladder change, fever, wt loss,  worsening LE pain/numbness/weakness, gait change or falls. Asks for repeat depomedrol and toradol as always seems to help.  Left groin pain improved, and walking better as well. No falls Past Medical History  Diagnosis Date  . Coronary artery disease     a. s/p inferior MI 1998->PCI of RCA. b. S/p prior PCI of the OM1 and LAD;  c. 05/2013 Cath/PCI: LM nl, LAD 90p (3.0x22 Resolute DES), 28m w/ patent stent, Diag 40p, 13m, LCX patent OM1 stent, RCA 50p, 24m, EF 65%.  . Hyperlipidemia   . Hypertension   . Obesity, unspecified   . Hypothyroidism   . Diabetes mellitus     TYPE II  . Anxiety state, unspecified   . Depressive disorder, not elsewhere classified   . Degeneration of lumbar or lumbosacral intervertebral disc   . Allergic rhinitis, cause unspecified   . Lower GI bleed 06/2010    Diverticular bleed  . Chronic LBP   . AAA (abdominal aortic aneurysm) ([place])     a. 4.7cm in 08/2012. Followed by [facility].  . Gout 08/22/2013  . Noncompliance with medications 02/26/2014   Past  Surgical History  Procedure Laterality Date  . Total abdominal hysterectomy    . Lumbar fusion  01/2007    DR. Patrice Paradise...3-LEVEL WITH FIXATION  . Parathyroidectomy    . Thyroidectomy    . Cardiac catheterization      PCI OF BOTH THE CIRCUMFLEX AND LEFT ANTERIOR DESCENDING ARTERY  . Cesarean section    . Heart stent  04-2010  and  Jun 07, 2013    X 3  . Spine surgery    . Left heart catheterization with coronary angiogram N/A 06/07/2013    Procedure: LEFT HEART CATHETERIZATION WITH CORONARY ANGIOGRAM;  Surgeon: [name], MD;  Location: [facility] CATH LAB;  Service: Cardiovascular;  Laterality: N/A;  . Left heart catheterization with coronary angiogram N/A 02/25/2014    Procedure: LEFT HEART CATHETERIZATION WITH CORONARY ANGIOGRAM;  Surgeon: Troy Sine, MD;  Location: [facility] CATH LAB;  Service: Cardiovascular;  Laterality: N/A;    reports that she quit smoking about 28 years ago. Her smoking use included Cigarettes. She quit after 30 years of use. She has never used smokeless tobacco. She reports that she does not drink alcohol or use illicit drugs. family history includes Arthritis in her maternal aunt; Cancer in her maternal aunt; Diabetes in her brother and maternal aunt; Heart attack (age of onset: 62) in her mother; Heart attack (age of onset: 61) in her father; Heart disease in her  father and mother. There is no history of Colon cancer. Allergies  Allergen Reactions  . Prilosec [Omeprazole] Other (See Comments)    Chest pain  . Miconazole Nitrate Hives    REACTION: hives  . Prednisone Swelling    Tongue/lip swelling.  . Augmentin [Amoxicillin-Pot Clavulanate] Hives, Itching and Rash  . Doxycycline Other (See Comments)    REACTION: gi upset   Current Outpatient Prescriptions on File Prior to Visit  Medication Sig Dispense Refill  . acetaminophen (TYLENOL) 325 MG tablet Take 2 tablets (650 mg total) by mouth every 4 (four) hours as needed for headache or mild pain.    [name]  allopurinol (ZYLOPRIM) 100 MG tablet Take 1 tablet (100 mg total) by mouth daily. 90 tablet 3  . aspirin EC 81 MG tablet Take 1 tablet (81 mg total) by mouth daily. For 1 month.  Then resume taking 4 times a week (tue thur sat sun) (Patient taking differently: Take 81 mg by mouth as directed. 4 times a week (tue thur sat sun))    . atenolol (TENORMIN) 25 MG tablet Take 1 tablet (25 mg total) by mouth 2 (two) times daily. 180 tablet 3  . colchicine 0.6 MG tablet Take 1 per hour for acute gout attack until improved, or until diarrhea 60 tablet 5  . HYDROcodone-acetaminophen (NORCO) 10-325 MG tablet Take 1 tablet by mouth every 6 (six) hours as needed. 30 tablet 0  . hydrOXYzine (ATARAX/VISTARIL) 10 MG tablet TAKE 1 TABLET BY MOUTH THREE TIMES DAILY AS NEEDED FOR ITCHING 90 tablet 0  . levofloxacin (LEVAQUIN) 250 MG tablet Take 1 tablet (250 mg total) by mouth daily. 10 tablet 0  . metFORMIN (GLUCOPHAGE-XR) 500 MG 24 hr tablet Take 4 tablets (2,000 mg total) by mouth daily with breakfast.    . nitroGLYCERIN (NITROSTAT) 0.4 MG SL tablet Place 1 tablet (0.4 mg total) under the tongue every 5 (five) minutes as needed. (Patient taking differently: Place 0.4 mg under the tongue every 5 (five) minutes as needed for chest pain. ) 25 tablet 1  . SYNTHROID 150 MCG tablet TAKE 1 TABLET BY MOUTH EVERY DAY 90 tablet 0  . tiZANidine (ZANAFLEX) 4 MG tablet Take 1 tablet (4 mg total) by mouth every 6 (six) hours as needed for muscle spasms. 40 tablet 1  . traMADol (ULTRAM) 50 MG tablet Take 1 tablet (50 mg total) by mouth every 6 (six) hours as needed. 60 tablet 1   No current facility-administered medications on file prior to visit.   Review of Systems  Constitutional: Negative for unusual diaphoresis or night sweats HENT: Negative for ringing in ear or discharge Eyes: Negative for double vision or worsening visual disturbance.  Respiratory: Negative for choking and stridor.   Gastrointestinal: Negative for  vomiting or other signifcant bowel change Genitourinary: Negative for hematuria or change in urine volume.  Musculoskeletal: Negative for other MSK pain or swelling Skin: Negative for color change and worsening wound.  Neurological: Negative for tremors and numbness other than noted  Psychiatric/Behavioral: Negative for decreased concentration or agitation other than above       Objective:   Physical Exam BP 128/80 mmHg  Pulse 75  Temp(Src) 98.7 F (37.1 C) (Oral)  Resp 20  Wt 220 lb (99.791 kg)  SpO2 93% VS noted,  Constitutional: Pt appears in no significant distress HENT: Head: NCAT.  Right Ear: External ear normal.  Left Ear: External ear normal.  Bilat tm's with mild erythema. Max sinus areas non tender.  Pharynx with mild erythema, no exudateEyes: . Pupils are equal, round, and reactive to light. Conjunctivae and EOM are normal Neck: Normal range of motion. Neck supple.  Cardiovascular: Normal rate and regular rhythm.  Pulmonary/Chest: Effort normal and breath sounds without rales or wheezing.  Abd: Soft, NT, ND, + BS Left inguinal area without mass or swelling, mild tender only + pain with passive left hip flexion to 30 degrees only Neurological: Pt is alert. Not confused , motor grossly intact Skin: Skin is warm. No rash, no LE edema Psychiatric: Pt behavior is normal. No agitation.       Assessment & Plan:

## 2015-03-27 NOTE — Progress Notes (Signed)
Pre visit review using our clinic review tool, if applicable. No additional management support is needed unless otherwise documented below in the visit note. 

## 2015-03-28 LAB — HEPATITIS C ANTIBODY: HCV Ab: NEGATIVE

## 2015-03-28 NOTE — Assessment & Plan Note (Signed)
stable overall by history and exam, recent data reviewed with pt, and pt to continue medical treatment as before,  to f/u any worsening symptoms or concerns Lab Results  Component Value Date   HGBA1C 6.6* 03/27/2015

## 2015-03-28 NOTE — Assessment & Plan Note (Signed)
D/w pt, ok to take the levaquin, and she agrees,  to f/u any worsening symptoms or concerns

## 2015-03-28 NOTE — Assessment & Plan Note (Signed)
stable overall by history and exam, recent data reviewed with pt, and pt to continue medical treatment as before,  to f/u any worsening symptoms or concerns BP Readings from Last 3 Encounters:  03/27/15 128/80  03/23/15 136/84  03/14/15 112/70

## 2015-03-28 NOTE — Assessment & Plan Note (Signed)
stable overall by history and exam, recent data reviewed with pt, and pt to continue medical treatment as before,  to f/u any worsening symptoms or concerns Lab Results  Component Value Date   LDLCALC 103* 07/03/2014

## 2015-03-28 NOTE — Assessment & Plan Note (Signed)
Improved, possible strain vs left hip djd, recent hip/pelvis film reviewed with pt, for pain control,  to f/u any worsening symptoms or concerns

## 2015-04-16 ENCOUNTER — Encounter: Payer: Self-pay | Admitting: Family Medicine

## 2015-04-16 ENCOUNTER — Ambulatory Visit (INDEPENDENT_AMBULATORY_CARE_PROVIDER_SITE_OTHER): Payer: Medicare Other | Admitting: Family Medicine

## 2015-04-16 VITALS — BP 110/72 | HR 70 | Ht 66.0 in | Wt 220.0 lb

## 2015-04-16 DIAGNOSIS — M1612 Unilateral primary osteoarthritis, left hip: Secondary | ICD-10-CM

## 2015-04-16 DIAGNOSIS — E669 Obesity, unspecified: Secondary | ICD-10-CM | POA: Diagnosis not present

## 2015-04-16 DIAGNOSIS — M1040 Other secondary gout, unspecified site: Secondary | ICD-10-CM

## 2015-04-16 DIAGNOSIS — M1990 Unspecified osteoarthritis, unspecified site: Secondary | ICD-10-CM

## 2015-04-16 DIAGNOSIS — I2510 Atherosclerotic heart disease of native coronary artery without angina pectoris: Secondary | ICD-10-CM

## 2015-04-16 HISTORY — DX: Unilateral primary osteoarthritis, left hip: M16.12

## 2015-04-16 NOTE — Progress Notes (Signed)
Pre visit review using our clinic review tool, if applicable. No additional management support is needed unless otherwise documented below in the visit note. 

## 2015-04-16 NOTE — Assessment & Plan Note (Addendum)
Encourage patient to get back allopurinol.. Given some ibuprofen to bridge for the next 3 days.

## 2015-04-16 NOTE — Assessment & Plan Note (Signed)
Patient does have severe arthritis of the left hip. I do believe that this is contribute into all of her pain. Underlying gout is also exacerbated it. We discussed different treatment options. Patient elected try conservative therapy. We discussed over-the-counter natural supplementations a could be helpful. Patient has tramadol, discussed proper shoes. We discussed over-the-counter orthotics. Discussed the importance of weight loss. Patient come back again in 4 weeks. If worsening symptoms we'll consider injection.

## 2015-04-16 NOTE — Patient Instructions (Signed)
Good to see you  Ice 20 minutes 2 times daily. Usually after activity and before bed. Exercises 3 times a week.  Take tylenol 500 mg three times a day is the best evidence based medicine we have for arthritis.  Vitamin D 2000 IU daily Fish oil 2 grams daily.  Tumeric 500mg  daily.  Tart cherry extract at night Duexis 3 times daily for 3 days unless it hurts your stomach  Start allopurinol again.  Cortisone injections are an option if these interventions do not seem to make a difference or need more relief.  It's important that you continue to stay active. Controlling your weight is important.  Shoe inserts with good arch support may be helpful.  Spenco orthotics at [employer] could help.  Good shoes with rigid bottom.  Jalene Mullet, [place] or New balance greater then [employer] and cycling with low resistance are the best two types of exercise for arthritis. Come back and see me in 4 weeks.  If not a lot better we will discuss injection or surgery

## 2015-04-16 NOTE — Assessment & Plan Note (Signed)
Encourage weight loss. Discussed what type of changes to make condyle. Patient and will come back and see me again in 4 weeks.

## 2015-04-16 NOTE — Progress Notes (Signed)
[name] [address] [place], [place] [address] Phone: [phone] Subjective:    I'm seeing this patient by the request  of:  [name], MD   CC: Left groin pain  [id number] [name] is a 70 y.o. female coming in with complaint of left groin pain. Seems to be worsening over the course of time the last week has been severe. Patient states that it seems to start in the groin and radiating down the leg somewhat. Has not made it very difficult to walk. Patient states going up and down stairs is almost impossible. Sometimes can wake her up at night. Describes it as a dull aching sensation that can have a sharp pain with certain flexing of the hip. Denies any numbness. States that there is weakness. Rates the severity pain is 7 out of 10. Some per Medicare provider who did get x-rays. These were independently visualized by me. X-ray show that moderate to severe arthritis of the hips more left greater than right.     Past Medical History  Diagnosis Date  . Coronary artery disease     a. s/p inferior MI 1998->PCI of RCA. b. S/p prior PCI of the OM1 and LAD;  c. 05/2013 Cath/PCI: LM nl, LAD 90p (3.0x22 Resolute DES), 15m w/ patent stent, Diag 40p, 16m, LCX patent OM1 stent, RCA 50p, 77m, EF 65%.  . Hyperlipidemia   . Hypertension   . Obesity, unspecified   . Hypothyroidism   . Diabetes mellitus     TYPE II  . Anxiety state, unspecified   . Depressive disorder, not elsewhere classified   . Degeneration of lumbar or lumbosacral intervertebral disc   . Allergic rhinitis, cause unspecified   . Lower GI bleed 06/2010    Diverticular bleed  . Chronic LBP   . AAA (abdominal aortic aneurysm) ([place])     a. 4.7cm in 08/2012. [facility]ed by [facility].  . Gout 08/22/2013  . Noncompliance with medications 02/26/2014   Past Surgical History  Procedure Laterality Date  . Total abdominal hysterectomy    . Lumbar fusion  01/2007    DR. Patrice Paradise...3-LEVEL WITH FIXATION  .  Parathyroidectomy    . Thyroidectomy    . Cardiac catheterization      PCI OF BOTH THE CIRCUMFLEX AND LEFT ANTERIOR DESCENDING ARTERY  . Cesarean section    . Heart stent  04-2010  and  Jun 07, 2013    X 3  . Spine surgery    . Left heart catheterization with coronary angiogram N/A 06/07/2013    Procedure: LEFT HEART CATHETERIZATION WITH CORONARY ANGIOGRAM;  Surgeon: [name], MD;  Location: [facility] CATH LAB;  Service: Cardiovascular;  Laterality: N/A;  . Left heart catheterization with coronary angiogram N/A 02/25/2014    Procedure: LEFT HEART CATHETERIZATION WITH CORONARY ANGIOGRAM;  Surgeon: Troy Sine, MD;  Location: [facility] CATH LAB;  Service: Cardiovascular;  Laterality: N/A;   Social History   Social History  . Marital Status: Divorced    Spouse Name: N/A  . Number of Children: 3  . Years of Education: N/A   Occupational History  . [place]    ADMIN SUPPORT  .     Social History Main Topics  . Smoking status: Former Smoker -- 30 years    Types: Cigarettes    Quit date: 05/31/1986  . Smokeless tobacco: Never Used  . Alcohol Use: No  . Drug Use: No  .  Sexual Activity: Not Asked   Other Topics Concern  . None   Social History Narrative   DIVORCED   3 CHILDREN   PATIENT SIGNED A DESIGNATED PARTY RELEASE TO ALLOW HER DAUGHTER, [name], TO HAVE ACCESS TO HER MEDICAL RECORDS/INFORMATION. [name], May 04, 2009 @ 3:27 PM   Smokes cigarettes on rare occasions.   Allergies  Allergen Reactions  . Prilosec [Omeprazole] Other (See Comments)    Chest pain  . Miconazole Nitrate Hives    REACTION: hives  . Prednisone Swelling    Tongue/lip swelling.  . Augmentin [Amoxicillin-Pot Clavulanate] Hives, Itching and Rash  . Doxycycline Other (See Comments)    REACTION: gi upset   Family History  Problem Relation Age of Onset  . Heart attack Mother 65    s/p D&C-CARDIAC ARREST 1966  . Heart disease Mother   . Heart attack Father 47     1978 WITH MI  . Heart disease Father   . Colon cancer Neg Hx   . Diabetes Brother   . Diabetes Maternal Aunt   . Arthritis Maternal Aunt   . Cancer Maternal Aunt     Past medical history, social, surgical and family history all reviewed in electronic medical record.  No pertanent information unless stated regarding to the chief complaint.   Review of Systems: No headache, visual changes, nausea, vomiting, diarrhea, constipation, dizziness, abdominal pain, skin rash, fevers, chills, night sweats, weight loss, swollen lymph nodes, body aches, joint swelling, muscle aches, chest pain, shortness of breath, mood changes.   Objective Blood pressure 110/72, pulse 70, height 5\' 6"  (1.676 m), weight 220 lb (99.791 kg), SpO2 96 %.  General: No apparent distress alert and oriented x3 mood and affect normal, dressed appropriately.  HEENT: Pupils equal, extraocular movements intact  Respiratory: Patient's speak in full sentences and does not appear short of breath  Cardiovascular: No lower extremity edema, non tender, no erythema  Skin: Warm dry intact with no signs of infection or rash on extremities or on axial skeleton.  Abdomen: Soft nontender  Neuro: Cranial nerves II through XII are intact, neurovascularly intact in all extremities with 2+ DTRs and 2+ pulses.  Lymph: No lymphadenopathy of posterior or anterior cervical chain or axillae bilaterally.  Gait antalgic gait MSK:  Non tender with full range of motion and good stability and symmetric strength and tone of shoulders, elbows, wrist, , knee and ankles bilaterally. Arthritic changes of multiple joints Hip: Left ROM IR: 5 with pain  Deg, ER: 25 Deg, Flexion: 100 Deg, Extension: 80 Deg, Abduction: 25 Deg, Adduction: 15 Deg Strength IR: 5/5, ER: 5/5, Flexion: 5/5, Extension: 5/5, Abduction: 4/5, Adduction: 4/5 Pelvic alignment unremarkable to inspection and palpation. Standing hip rotation and gait without trendelenburg sign /  unsteadiness. Greater trochanter without tenderness to palpation. No tenderness over piriformis and greater trochanter. Severe pain with internal range of motion No SI joint tenderness and normal minimal SI movement. Contralateral hip has some mild decrease in internal range of motion but no pain.   Impression and Recommendations:     This case required medical decision making of moderate complexity.      Note: This dictation was prepared with Dragon dictation along with smaller phrase technology. Any transcriptional errors that result from this process are unintentional.

## 2015-05-14 ENCOUNTER — Encounter: Payer: Self-pay | Admitting: Internal Medicine

## 2015-05-14 ENCOUNTER — Ambulatory Visit (INDEPENDENT_AMBULATORY_CARE_PROVIDER_SITE_OTHER): Payer: Medicare Other | Admitting: Internal Medicine

## 2015-05-14 ENCOUNTER — Ambulatory Visit: Payer: Medicare Other | Admitting: Family Medicine

## 2015-05-14 VITALS — BP 118/70 | HR 100 | Temp 98.4°F | Resp 20 | Wt 225.0 lb

## 2015-05-14 DIAGNOSIS — I10 Essential (primary) hypertension: Secondary | ICD-10-CM

## 2015-05-14 DIAGNOSIS — J019 Acute sinusitis, unspecified: Secondary | ICD-10-CM | POA: Diagnosis not present

## 2015-05-14 DIAGNOSIS — I2510 Atherosclerotic heart disease of native coronary artery without angina pectoris: Secondary | ICD-10-CM | POA: Diagnosis not present

## 2015-05-14 DIAGNOSIS — E11311 Type 2 diabetes mellitus with unspecified diabetic retinopathy with macular edema: Secondary | ICD-10-CM | POA: Diagnosis not present

## 2015-05-14 HISTORY — DX: Acute sinusitis, unspecified: J01.90

## 2015-05-14 MED ORDER — ACETAMINOPHEN-CODEINE 300-30 MG PO TABS: 1.0000 | ORAL_TABLET | Freq: Four times a day (QID) | ORAL | Status: DC | PRN

## 2015-05-14 MED ORDER — LEVOFLOXACIN 250 MG PO TABS: 250.0000 mg | ORAL_TABLET | Freq: Every day | ORAL | Status: DC

## 2015-05-14 NOTE — Progress Notes (Signed)
Pre visit review using our clinic review tool, if applicable. No additional management support is needed unless otherwise documented below in the visit note. 

## 2015-05-14 NOTE — Progress Notes (Signed)
Subjective:    Patient ID: [name], female    DOB: November 08, 1945, 70 y.o.   MRN: [id number]  HPI   Here with 2-3 days acute onset fever, mod constant facial pain, pressure, headache, general weakness and malaise, and greenish d/c, with mild ST and cough, and right ear pain but pt denies chest pain, wheezing, increased sob or doe, orthopnea, PND, increased LE swelling, palpitations, dizziness or syncope. Pt denies new neurological symptoms such as new headache, or facial or extremity weakness or numbness   Pt denies polydipsia, polyuria   Pt denies recent wt loss, night sweats, loss of appetite, or other constitutional symptoms. Denies worsening depressive symptoms, suicidal ideation, or panic  Past Medical History  Diagnosis Date  . Coronary artery disease     a. s/p inferior MI 1998->PCI of RCA. b. S/p prior PCI of the OM1 and LAD;  c. 05/2013 Cath/PCI: LM nl, LAD 90p (3.0x22 Resolute DES), 51m w/ patent stent, Diag 40p, 31m, LCX patent OM1 stent, RCA 50p, 55m, EF 65%.  . Hyperlipidemia   . Hypertension   . Obesity, unspecified   . Hypothyroidism   . Diabetes mellitus     TYPE II  . Anxiety state, unspecified   . Depressive disorder, not elsewhere classified   . Degeneration of lumbar or lumbosacral intervertebral disc   . Allergic rhinitis, cause unspecified   . Lower GI bleed 06/2010    Diverticular bleed  . Chronic LBP   . AAA (abdominal aortic aneurysm) ([place])     a. 4.7cm in 08/2012. [facility] by [facility].  . Gout 08/22/2013  . Noncompliance with medications 02/26/2014   Past Surgical History  Procedure Laterality Date  . Total abdominal hysterectomy    . Lumbar fusion  01/2007    DR. Patrice Paradise...3-LEVEL WITH FIXATION  . Parathyroidectomy    . Thyroidectomy    . Cardiac catheterization      PCI OF BOTH THE CIRCUMFLEX AND LEFT ANTERIOR DESCENDING ARTERY  . Cesarean section    . Heart stent  04-2010  and  Jun 07, 2013    X 3  . Spine surgery    . Left heart catheterization with  coronary angiogram N/A 06/07/2013    Procedure: LEFT HEART CATHETERIZATION WITH CORONARY ANGIOGRAM;  Surgeon: [name], MD;  Location: [facility] CATH LAB;  Service: Cardiovascular;  Laterality: N/A;  . Left heart catheterization with coronary angiogram N/A 02/25/2014    Procedure: LEFT HEART CATHETERIZATION WITH CORONARY ANGIOGRAM;  Surgeon: Troy Sine, MD;  Location: [facility] CATH LAB;  Service: Cardiovascular;  Laterality: N/A;    reports that she quit smoking about 28 years ago. Her smoking use included Cigarettes. She quit after 30 years of use. She has never used smokeless tobacco. She reports that she does not drink alcohol or use illicit drugs. family history includes Arthritis in her maternal aunt; Cancer in her maternal aunt; Diabetes in her brother and maternal aunt; Heart attack (age of onset: 68) in her mother; Heart attack (age of onset: 8) in her father; Heart disease in her father and mother. There is no history of Colon cancer. Allergies  Allergen Reactions  . Prilosec [Omeprazole] Other (See Comments)    Chest pain  . Miconazole Nitrate Hives    REACTION: hives  . Prednisone Swelling    Tongue/lip swelling.  . Augmentin [Amoxicillin-Pot Clavulanate] Hives, Itching and Rash  . Doxycycline Other (See Comments)    REACTION: gi upset   Current Outpatient Prescriptions on File  Prior to Visit  Medication Sig Dispense Refill  . acetaminophen (TYLENOL) 325 MG tablet Take 2 tablets (650 mg total) by mouth every 4 (four) hours as needed for headache or mild pain.    [name] allopurinol (ZYLOPRIM) 100 MG tablet Take 1 tablet (100 mg total) by mouth daily. 90 tablet 3  . aspirin EC 81 MG tablet Take 1 tablet (81 mg total) by mouth daily. For 1 month.  Then resume taking 4 times a week (tue thur sat sun) (Patient taking differently: Take 81 mg by mouth as directed. 4 times a week (tue thur sat sun))    . atenolol (TENORMIN) 25 MG tablet Take 1 tablet (25 mg total) by mouth 2 (two) times  daily. 180 tablet 3  . colchicine 0.6 MG tablet Take 1 per hour for acute gout attack until improved, or until diarrhea 60 tablet 5  . HYDROcodone-acetaminophen (NORCO) 10-325 MG tablet Take 1 tablet by mouth every 6 (six) hours as needed. 30 tablet 0  . hydrOXYzine (ATARAX/VISTARIL) 10 MG tablet TAKE 1 TABLET BY MOUTH THREE TIMES DAILY AS NEEDED FOR ITCHING 90 tablet 0  . metFORMIN (GLUCOPHAGE-XR) 500 MG 24 hr tablet Take 4 tablets (2,000 mg total) by mouth daily with breakfast.    . nitroGLYCERIN (NITROSTAT) 0.4 MG SL tablet Place 1 tablet (0.4 mg total) under the tongue every 5 (five) minutes as needed. (Patient taking differently: Place 0.4 mg under the tongue every 5 (five) minutes as needed for chest pain. ) 25 tablet 1  . rosuvastatin (CRESTOR) 10 MG tablet Take 1 tablet (10 mg total) by mouth daily. 90 tablet 3  . SYNTHROID 150 MCG tablet TAKE 1 TABLET BY MOUTH EVERY DAY 90 tablet 0  . tiZANidine (ZANAFLEX) 4 MG tablet Take 1 tablet (4 mg total) by mouth every 6 (six) hours as needed for muscle spasms. 40 tablet 1  . traMADol (ULTRAM) 50 MG tablet Take 1 tablet (50 mg total) by mouth every 6 (six) hours as needed. 60 tablet 1   No current facility-administered medications on file prior to visit.   Review of Systems  Constitutional: Negative for unusual diaphoresis or night sweats HENT: Negative for ear swelling or discharge Eyes: Negative for worsening visual haziness  Respiratory: Negative for choking and stridor.   Gastrointestinal: Negative for distension or worsening eructation Genitourinary: Negative for retention or change in urine volume.  Musculoskeletal: Negative for other MSK pain or swelling Skin: Negative for color change and worsening wound Neurological: Negative for tremors and numbness other than noted  Psychiatric/Behavioral: Negative for decreased concentration or agitation other than above       Objective:   Physical Exam BP 118/70 mmHg  Pulse 100  Temp(Src)  98.4 F (36.9 C) (Oral)  Resp 20  Wt 225 lb (102.059 kg)  SpO2 97% VS noted, mild ill Constitutional: Pt appears in no apparent distress HENT: Head: NCAT.  Right Ear: External ear normal.  Left Ear: External ear normal.  Bilat tm's with mild erythema.  Max sinus areas mild tender.  Pharynx with mild erythema, no exudate Eyes: . Pupils are equal, round, and reactive to light. Conjunctivae and EOM are normal Neck: Normal range of motion. Neck supple.  Cardiovascular: Normal rate and regular rhythm.   Pulmonary/Chest: Effort normal and breath sounds without rales or wheezing.  Neurological: Pt is alert. Not confused , motor grossly intact Skin: Skin is warm. No rash, no LE edema Psychiatric: Pt behavior is normal. No agitation.  Assessment & Plan:

## 2015-05-14 NOTE — Patient Instructions (Signed)
Please take all new medication as prescribed- the antibiotic, and pain medication if needed  Please continue all other medications as before, and refills have been done if requested.  Please have the pharmacy call with any other refills you may need.  Please keep your appointments with your specialists as you may have planned

## 2015-05-15 ENCOUNTER — Encounter: Payer: Self-pay | Admitting: Family Medicine

## 2015-05-15 ENCOUNTER — Ambulatory Visit (INDEPENDENT_AMBULATORY_CARE_PROVIDER_SITE_OTHER): Payer: Medicare Other | Admitting: Family Medicine

## 2015-05-15 VITALS — BP 108/80 | HR 80 | Ht 66.0 in | Wt 222.0 lb

## 2015-05-15 DIAGNOSIS — M5416 Radiculopathy, lumbar region: Secondary | ICD-10-CM

## 2015-05-15 DIAGNOSIS — M1612 Unilateral primary osteoarthritis, left hip: Secondary | ICD-10-CM

## 2015-05-15 DIAGNOSIS — M1990 Unspecified osteoarthritis, unspecified site: Secondary | ICD-10-CM

## 2015-05-15 DIAGNOSIS — I2510 Atherosclerotic heart disease of native coronary artery without angina pectoris: Secondary | ICD-10-CM

## 2015-05-15 HISTORY — DX: Radiculopathy, lumbar region: M54.16

## 2015-05-15 MED ORDER — GABAPENTIN 100 MG PO CAPS: 200.0000 mg | ORAL_CAPSULE | Freq: Every day | ORAL | Status: DC

## 2015-05-15 MED ORDER — KETOROLAC TROMETHAMINE 60 MG/2ML IM SOLN
60.0000 mg | Freq: Once | INTRAMUSCULAR | Status: AC
  Administered 2015-05-15: 60 mg via INTRAMUSCULAR

## 2015-05-15 NOTE — Assessment & Plan Note (Signed)
Mild to mod, for antibx course,  to f/u any worsening symptoms or concerns, also pain control with tylenol #3 limited short term rx,  to f/u any worsening symptoms or concerns

## 2015-05-15 NOTE — Progress Notes (Signed)
[name] [address], [place] [address] Phone: [phone] Subjective:    I'm seeing this patient by the request  of:  Brandi Cower, MD   CC: Left groin pain f/u   [id number] [name] is a 70 y.o. female coming in with complaint of left groin pain. Patient was found to have severe arthritis of the hips bilaterally. Patient was to do home exercises as well as continuing over-the-counter medications. Patient was to wear proper shoes. States that some of the groin pain seems to be improving. Patient states though that she is having worsening back pain. Patient has known back surgery previously. Patient states that since the back surgery she has not made any significant improvement. Rates the severity of pain a 7 out of 10. States that any walking longer than 10 minutes she has to take a break because sometimes her legs feel weak. States that most the pain seems to be on the left side. When reviewing her chart patient has had an old 3 through S1 posterior lumbar fusion. Patient was told at one point she would need to have another back surgery which she wants to avoid. Continues on her chronic pain medications fairly regularly.     Past Medical History  Diagnosis Date  . Coronary artery disease     a. s/p inferior MI 1998->PCI of RCA. b. S/p prior PCI of the OM1 and LAD;  c. 05/2013 Cath/PCI: LM nl, LAD 90p (3.0x22 Resolute DES), 47m w/ patent stent, Diag 40p, 16m, LCX patent OM1 stent, RCA 50p, 59m, EF 65%.  . Hyperlipidemia   . Hypertension   . Obesity, unspecified   . Hypothyroidism   . Diabetes mellitus     TYPE II  . Anxiety state, unspecified   . Depressive disorder, not elsewhere classified   . Degeneration of lumbar or lumbosacral intervertebral disc   . Allergic rhinitis, cause unspecified   . Lower GI bleed 06/2010    Diverticular bleed  . Chronic LBP   . AAA (abdominal aortic aneurysm) ([place])     a. 4.7cm in 08/2012. Followed by  [facility].  . Gout 08/22/2013  . Noncompliance with medications 02/26/2014   Past Surgical History  Procedure Laterality Date  . Total abdominal hysterectomy    . Lumbar fusion  01/2007    DR. Patrice [name]...3-LEVEL WITH FIXATION  . Parathyroidectomy    . Thyroidectomy    . Cardiac catheterization      PCI OF BOTH THE CIRCUMFLEX AND LEFT ANTERIOR DESCENDING ARTERY  . Cesarean section    . Heart stent  04-2010  and  Jun 07, 2013    X 3  . Spine surgery    . Left heart catheterization with coronary angiogram N/A 06/07/2013    Procedure: LEFT HEART CATHETERIZATION WITH CORONARY ANGIOGRAM;  Surgeon: [name], MD;  Location: [facility] CATH LAB;  Service: Cardiovascular;  Laterality: N/A;  . Left heart catheterization with coronary angiogram N/A 02/25/2014    Procedure: LEFT HEART CATHETERIZATION WITH CORONARY ANGIOGRAM;  Surgeon: Troy Sine, MD;  Location: [facility] CATH LAB;  Service: Cardiovascular;  Laterality: N/A;   Social History   Social History  . Marital Status: Divorced    Spouse Name: N/A  . Number of Children: 3  . Years of Education: N/A   Occupational History  . [name]    ADMIN SUPPORT  .     Social History Main Topics  . Smoking  status: Former Smoker -- 30 years    Types: Cigarettes    Quit date: 05/31/1986  . Smokeless tobacco: Never Used  . Alcohol Use: No  . Drug Use: No  . Sexual Activity: Not on file   Other Topics Concern  . Not on file   Social History Narrative   DIVORCED   3 CHILDREN   PATIENT SIGNED A DESIGNATED PARTY RELEASE TO ALLOW HER DAUGHTER, [name], TO HAVE ACCESS TO HER MEDICAL RECORDS/INFORMATION. [name], May 04, 2009 @ 3:27 PM   Smokes cigarettes on rare occasions.   Allergies  Allergen Reactions  . Prilosec [Omeprazole] Other (See Comments)    Chest pain  . Miconazole Nitrate Hives    REACTION: hives  . Prednisone Swelling    Tongue/lip swelling.  . Augmentin [Amoxicillin-Pot Clavulanate] Hives, Itching  and Rash  . Doxycycline Other (See Comments)    REACTION: gi upset   Family History  Problem Relation Age of Onset  . Heart attack Mother 22    s/p D&C-CARDIAC ARREST 1966  . Heart disease Mother   . Heart attack Father 34    1978 WITH MI  . Heart disease Father   . Colon cancer Neg Hx   . Diabetes Brother   . Diabetes Maternal Aunt   . Arthritis Maternal Aunt   . Cancer Maternal Aunt     Past medical history, social, surgical and family history all reviewed in electronic medical record.  No pertanent information unless stated regarding to the chief complaint.   Review of Systems: No headache, visual changes, nausea, vomiting, diarrhea, constipation, dizziness, abdominal pain, skin rash, fevers, chills, night sweats, weight loss, swollen lymph nodes, body aches, joint swelling, muscle aches, chest pain, shortness of breath, mood changes.   Objective There were no vitals taken for this visit.  General: No apparent distress alert and oriented x3 mood and affect normal, dressed appropriately.  HEENT: Pupils equal, extraocular movements intact  Respiratory: Patient's speak in full sentences and does not appear short of breath  Cardiovascular: No lower extremity edema, non tender, no erythema  Skin: Warm dry intact with no signs of infection or rash on extremities or on axial skeleton.  Abdomen: Soft nontender  Neuro: Cranial nerves II through XII are intact, neurovascularly intact in all extremities with 2+ DTRs and 2+ pulses.  Lymph: No lymphadenopathy of posterior or anterior cervical chain or axillae bilaterally.  Gait antalgic gait MSK:  Non tender with full range of motion and good stability and symmetric strength and tone of shoulders, elbows, wrist, , knee and ankles bilaterally. Arthritic changes of multiple joints  Hip: Left ROM IR: 5 with pain  Deg, ER: 25 Deg, Flexion: 100 Deg, Extension: 80 Deg, Abduction: 25 Deg, Adduction: 15 Deg Strength IR: 5/5, ER: 5/5, Flexion:  5/5, Extension: 5/5, Abduction: 4/5, Adduction: 4/5 Pelvic alignment unremarkable to inspection and palpation. Standing hip rotation and gait without trendelenburg sign / unsteadiness. Greater trochanter without tenderness to palpation. No tenderness over piriformis and greater trochanter. Severe pain with internal range of motion No SI joint tenderness and normal minimal SI movement. Contralateral hip has some mild decrease in internal range of motion but no pain. No change from previous exam Patient back exam shows patient is tender to palpation in the paraspinal musculature of the lumbar spine. Patient does not have any spinous process tenderness. Patient has a mild positive straight leg test on the left.   Impression and Recommendations:     This case  required medical decision making of moderate complexity.      Note: This dictation was prepared with Dragon dictation along with smaller phrase technology. Any transcriptional errors that result from this process are unintentional.

## 2015-05-15 NOTE — Assessment & Plan Note (Signed)
Continues to give her significant pain. We discussed with patient about the possibility of replacement which patient was to avoid any surgery. I do think it would improve her quality of life. We did discuss once again about possibly intra-articular injection which patient declined. Patient also declined formal physical therapy. Discussed with her to continue the same regimen then we will see her again in 6 weeks.  Spent  25 minutes with patient face-to-face and had greater than 50% of counseling including as described above in assessment and plan.

## 2015-05-15 NOTE — Assessment & Plan Note (Signed)
stable overall by history and exam, recent data reviewed with pt, and pt to continue medical treatment as before,  to f/u any worsening symptoms or concerns BP Readings from Last 3 Encounters:  05/14/15 118/70  04/16/15 110/72  03/27/15 128/80

## 2015-05-15 NOTE — Progress Notes (Signed)
Pre visit review using our clinic review tool, if applicable. No additional management support is needed unless otherwise documented below in the visit note. 

## 2015-05-15 NOTE — Assessment & Plan Note (Signed)
stable overall by history and exam, recent data reviewed with pt, and pt to continue medical treatment as before,  to f/u any worsening symptoms or concerns Lab Results  Component Value Date   HGBA1C 6.6* 03/27/2015

## 2015-05-15 NOTE — Assessment & Plan Note (Signed)
Patient does have more of a lumbar radiculopathy. We discussed icing regimen and home exercises. We discussed which activities to do an which was to avoid. I do believe the patient likely has adjacent segment disease that is contributing to some of the discomfort. Differential also includes spinal stenosis with her having fatigue in the legs. Advance imaging would be worn 10 but patient wants to avoid any surgical intervention. Do not think that any type of injection would be beneficial with patient having previous surgery. Discussed if worsening symptoms prednisone would be necessary. Would like to avoid this secondary to her other comorbidities. Patient will continue with the over-the-counter medications, we discussed icing regimen. Patient declined formal physical therapy. Does not want to follow-up with her surgeon. We will see her back again in 6 weeks for further evaluation and treatment.

## 2015-05-15 NOTE — Patient Instructions (Signed)
Good to see yo u 1 injection today  Gabapentin 100-200mg  at night to help with sleep and the pain in the back .  Continue the good shoes.  Duexis up to 2 times a day as needed.  If you like it I can send in a prescritioin of the 2 medicines in the Duexis separately for cheap  Ice is still good See me again in 6-weeks.

## 2015-06-26 ENCOUNTER — Ambulatory Visit (INDEPENDENT_AMBULATORY_CARE_PROVIDER_SITE_OTHER): Payer: Medicare Other | Admitting: Cardiovascular Disease

## 2015-06-26 ENCOUNTER — Encounter: Payer: Self-pay | Admitting: Cardiovascular Disease

## 2015-06-26 VITALS — BP 124/62 | HR 73 | Ht 66.0 in | Wt 221.0 lb

## 2015-06-26 DIAGNOSIS — I2510 Atherosclerotic heart disease of native coronary artery without angina pectoris: Secondary | ICD-10-CM

## 2015-06-26 DIAGNOSIS — I25118 Atherosclerotic heart disease of native coronary artery with other forms of angina pectoris: Secondary | ICD-10-CM | POA: Diagnosis not present

## 2015-06-26 NOTE — Progress Notes (Signed)
Cardiology Office Note Date:  06/26/2015   ID:  [name], DOB December 15, 1945, MRN [id number]  PCP:  [name], MD  Cardiologist:  Sherren Mocha, MD    Chief Complaint  Patient presents with  . Shortness of Breath    History of Present Illness: [name] is a 70 y.o. female who presents for follow-up evaluation. The patient has coronary artery disease and she has undergone previous stenting of the LAD and left circumflex. She last presented with symptoms concerning for unstable angina in 2016. Cardiac catheterization at that time demonstrated patency at her stent sites in minor nonobstructive CAD elsewhere.  The patient complains of left shoulder and arm pain when she lies down on her right side. She has no exertional chest or arm pain. Some of her symptoms remind her of her cardiac pain but not as bad as in the past. She has chronic shortness of breath with exertion. She denies orthopnea, PND, or leg swelling. Her breathing has not changed significantly over the past few years.  Past Medical History  Diagnosis Date  . Coronary artery disease     a. s/p inferior MI 1998->PCI of RCA. b. S/p prior PCI of the OM1 and LAD;  c. 05/2013 Cath/PCI: LM nl, LAD 90p (3.0x22 Resolute DES), 55m w/ patent stent, Diag 40p, 65m, LCX patent OM1 stent, RCA 50p, 29m, EF 65%.  . Hyperlipidemia   . Hypertension   . Obesity, unspecified   . Hypothyroidism   . Diabetes mellitus     TYPE II  . Anxiety state, unspecified   . Depressive disorder, not elsewhere classified   . Degeneration of lumbar or lumbosacral intervertebral disc   . Allergic rhinitis, cause unspecified   . Lower GI bleed 06/2010    Diverticular bleed  . Chronic LBP   . AAA (abdominal aortic aneurysm) ([place])     a. 4.7cm in 08/2012. Followed by [facility].  . Gout 08/22/2013  . Noncompliance with medications 02/26/2014    Past Surgical History  Procedure Laterality Date  . Total abdominal hysterectomy    . Lumbar fusion  01/2007    DR.  Patrice [place]...3-LEVEL WITH FIXATION  . Parathyroidectomy    . Thyroidectomy    . Cardiac catheterization      PCI OF BOTH THE CIRCUMFLEX AND LEFT ANTERIOR DESCENDING ARTERY  . Cesarean section    . Heart stent  04-2010  and  Jun 07, 2013    X 3  . Spine surgery    . Left heart catheterization with coronary angiogram N/A 06/07/2013    Procedure: LEFT HEART CATHETERIZATION WITH CORONARY ANGIOGRAM;  Surgeon: [name], MD;  Location: [facility] CATH LAB;  Service: Cardiovascular;  Laterality: N/A;  . Left heart catheterization with coronary angiogram N/A 02/25/2014    Procedure: LEFT HEART CATHETERIZATION WITH CORONARY ANGIOGRAM;  Surgeon: Troy Sine, MD;  Location: [facility] CATH LAB;  Service: Cardiovascular;  Laterality: N/A;    Current Outpatient Prescriptions  Medication Sig Dispense Refill  . acetaminophen (TYLENOL) 325 MG tablet Take 2 tablets (650 mg total) by mouth every 4 (four) hours as needed for headache or mild pain.    . Acetaminophen-Codeine (TYLENOL/CODEINE #3) 300-30 MG tablet Take 1 tablet by mouth every 6 (six) hours as needed for pain. 40 tablet 0  . allopurinol (ZYLOPRIM) 100 MG tablet Take 1 tablet (100 mg total) by mouth daily. 90 tablet 3  . aspirin 81 MG EC tablet Take 81 mg by mouth. 4 days a  week    . atenolol (TENORMIN) 25 MG tablet Take 1 tablet (25 mg total) by mouth 2 (two) times daily. 180 tablet 3  . colchicine 0.6 MG tablet Take 1 per hour for acute gout attack until improved, or until diarrhea 60 tablet 5  . gabapentin (NEURONTIN) 100 MG capsule Take 2 capsules (200 mg total) by mouth at bedtime. 60 capsule 3  . HYDROcodone-acetaminophen (NORCO) 10-325 MG tablet Take 1 tablet by mouth every 6 (six) hours as needed for moderate pain or severe pain.    . hydrOXYzine (ATARAX/VISTARIL) 10 MG tablet TAKE 1 TABLET BY MOUTH THREE TIMES DAILY AS NEEDED FOR ITCHING 90 tablet 0  . metFORMIN (GLUCOPHAGE-XR) 500 MG 24 hr tablet Take 4 tablets (2,000 mg total) by mouth daily  with breakfast.    . nitroGLYCERIN (NITROSTAT) 0.4 MG SL tablet Place 0.4 mg under the tongue every 5 (five) minutes as needed for chest pain.    . rosuvastatin (CRESTOR) 10 MG tablet Take 1 tablet (10 mg total) by mouth daily. 90 tablet 3  . SYNTHROID 150 MCG tablet TAKE 1 TABLET BY MOUTH EVERY DAY 90 tablet 0  . tiZANidine (ZANAFLEX) 4 MG tablet Take 1 tablet (4 mg total) by mouth every 6 (six) hours as needed for muscle spasms. 40 tablet 1  . traMADol (ULTRAM) 50 MG tablet Take 1 tablet (50 mg total) by mouth every 6 (six) hours as needed. 60 tablet 1   No current facility-administered medications for this visit.   Allergies:   Prilosec; Miconazole nitrate; Prednisone; Augmentin; and Doxycycline   Social History:  The patient  reports that she quit smoking about 29 years ago. Her smoking use included Cigarettes. She quit after 30 years of use. She has never used smokeless tobacco. She reports that she does not drink alcohol or use illicit drugs.   Family History:  The patient's  family history includes Arthritis in her maternal aunt; Cancer in her maternal aunt; Diabetes in her brother and maternal aunt; Heart attack (age of onset: 65) in her mother; Heart attack (age of onset: 31) in her father; Heart disease in her father and mother. There is no history of Colon cancer.    ROS:  Please see the history of present illness.  Otherwise, review of systems is positive for weight gain, back pain, sweating, fatigue.  All other systems are reviewed and negative.    PHYSICAL EXAM: VS:  BP 124/62 mmHg  Pulse 73  Ht 5\' 6"  (1.676 m)  Wt 221 lb (100.245 kg)  BMI 35.69 kg/m2 , BMI Body mass index is 35.69 kg/(m^2). GEN: Well nourished, well developed, pleasant overweight woman in no acute distress HEENT: normal Neck: no JVD, no masses. No carotid bruits Cardiac: RRR without murmur or gallop                Respiratory:  clear to auscultation bilaterally, normal work of breathing GI: soft,  nontender, nondistended, + BS MS: no deformity or atrophy Ext: no pretibial edema, pedal pulses 2+= bilaterally Skin: warm and dry, no rash Neuro:  Strength and sensation are intact Psych: euthymic mood, full affect  EKG:  EKG is ordered today. The ekg ordered today shows normal sinus rhythm 73 bpm, left axis deviation, pulmonary disease pattern, incomplete right bundle branch block  Recent Labs: 03/27/2015: ALT 22; BUN 11; Creatinine, Ser 0.91; Hemoglobin 14.6; Platelets 327.0; Potassium 4.1; Sodium 142; TSH 6.39*   Lipid Panel     Component Value Date/Time  CHOL 198 03/27/2015 1137   TRIG 229.0* 03/27/2015 1137   TRIG 102 01/02/2006 0825   HDL 33.40* 03/27/2015 1137   CHOLHDL 6 03/27/2015 1137   CHOLHDL 3.3 CALC 01/02/2006 0825   VLDL 45.8* 03/27/2015 1137   LDLCALC 103* 07/03/2014 0759   LDLDIRECT 131.0 03/27/2015 1137      Wt Readings from Last 3 Encounters:  06/26/15 221 lb (100.245 kg)  05/15/15 222 lb (100.699 kg)  05/14/15 225 lb (102.059 kg)     Cardiac Studies Reviewed: 2-D echocardiogram November 26, 2014: Study Conclusions  - Left ventricle: The cavity size was normal. Systolic function was  normal. The estimated ejection fraction was in the range of 55%  to 60%. Wall motion was normal; there were no regional wall  motion abnormalities. Doppler parameters are consistent with  abnormal left ventricular relaxation (grade 1 diastolic  dysfunction). - Aortic valve: There was no stenosis. - Mitral valve: Mildly to moderately calcified annulus. Mildly  calcified leaflets . There was no significant regurgitation. - Right ventricle: The cavity size was normal. Systolic function  was normal. - Pulmonary arteries: No complete TR doppler jet so unable to  estimate PA systolic pressure. - Inferior vena cava: The vessel was normal in size. The  respirophasic diameter changes were in the normal range (= 50%),  consistent with normal central venous  pressure.  Impressions:  - Normal LV size with EF 55-60%. Normal RV size and systolic  function. No significant valvular abnormalities.  Cardiac catheterization 02/25/2014: IMPRESSION:  Normal LV function with an ejection fraction of 55-60% and minimal residual focal mid inferior hypocontractility.  Mild not significantly obstructive CAD with widely patent stents in the LAD, circumflex marginal vessel, and 40% diffuse narrowing in the proximal RCA.  RECOMMENDATION: Medical therapy.  ASSESSMENT AND PLAN: 1.  CAD, native vessel, with atypical angina: The patient's left arm symptoms do not occur with physical exertion and they appear to be positional. I do not think this is cardiac related pain. She's had a normal heart catheterization last year. Will continue with her current medical program.   2. Essential hypertension: Medications reviewed and will continue current program.  3. Shortness of breath, unchanged: Last echo reviewed.  4. Hyperlipidemia: unable to tolerate lipid-lowering drugs. Lifestyle modification reviewed.   Current medicines are reviewed with the patient today.  The patient does not have concerns regarding medicines.  Labs/ tests ordered today include:   Orders Placed This Encounter  Procedures  . EKG 12-Lead   Disposition:   FU 6 months  Signed, Sherren Mocha, MD  06/26/2015 5:05 PM    [facility] [address], [place], [place]  [address] Phone: [phone]; Fax: [phone]

## 2015-06-26 NOTE — Patient Instructions (Signed)

## 2015-06-30 ENCOUNTER — Encounter: Payer: Self-pay | Admitting: Family Medicine

## 2015-06-30 ENCOUNTER — Ambulatory Visit (INDEPENDENT_AMBULATORY_CARE_PROVIDER_SITE_OTHER): Payer: Medicare Other | Admitting: Family Medicine

## 2015-06-30 VITALS — BP 108/72 | HR 83 | Ht 66.0 in | Wt 223.0 lb

## 2015-06-30 DIAGNOSIS — M5416 Radiculopathy, lumbar region: Secondary | ICD-10-CM

## 2015-06-30 DIAGNOSIS — M1990 Unspecified osteoarthritis, unspecified site: Secondary | ICD-10-CM

## 2015-06-30 DIAGNOSIS — I2510 Atherosclerotic heart disease of native coronary artery without angina pectoris: Secondary | ICD-10-CM | POA: Diagnosis not present

## 2015-06-30 DIAGNOSIS — M1612 Unilateral primary osteoarthritis, left hip: Secondary | ICD-10-CM

## 2015-06-30 MED ORDER — METHYLPREDNISOLONE ACETATE 80 MG/ML IJ SUSP
80.0000 mg | Freq: Once | INTRAMUSCULAR | Status: AC
  Administered 2015-06-30: 80 mg via INTRAMUSCULAR

## 2015-06-30 MED ORDER — GABAPENTIN 100 MG PO CAPS: 200.0000 mg | ORAL_CAPSULE | Freq: Every day | ORAL | Status: DC

## 2015-06-30 MED ORDER — TIZANIDINE HCL 4 MG PO TABS: 4.0000 mg | ORAL_TABLET | Freq: Four times a day (QID) | ORAL | Status: DC | PRN

## 2015-06-30 MED ORDER — KETOROLAC TROMETHAMINE 60 MG/2ML IM SOLN
60.0000 mg | Freq: Once | INTRAMUSCULAR | Status: AC
  Administered 2015-06-30: 60 mg via INTRAMUSCULAR

## 2015-06-30 NOTE — Progress Notes (Signed)
[name] [place] [place], [place] [address] Phone: [phone] Subjective:    I'm seeing this patient by the request  of:  Brandi Cower, MD   CC: For back pain follow-up  [id number] [name] is a 70 y.o. female coming in with complaint of left groin pain. Patient was found to have severe arthritis of the hips bilaterally.  Patient states though it seems that it is more back than her hips. States that it seems to be more on the lower back. Has a history of multiple back surgeries. Please see past medical and surgical history. Patient is having severe amount of pain. Has not responded to epidural steroid injections that she's got from an outside facility she states multiple months ago. States that the pain is severe. Patient did not start the medications that was given to her previously. Patient was to start gabapentin as well as a muscle relaxer. Patient states that he was due to financial constraints. Patient is not doing the exercises and overall states that she has been fairly noncompliant with the suggestions. Patient is concerned though because daily activity such as standing even to wash the dishes has become more difficult. Some mild radiation down the leg but not as severe as what it was but states that the pain in the back itself is more severe.     Past Medical History  Diagnosis Date  . Coronary artery disease     a. s/p inferior MI 1998->PCI of RCA. b. S/p prior PCI of the OM1 and LAD;  c. 05/2013 Cath/PCI: LM nl, LAD 90p (3.0x22 Resolute DES), 83m w/ patent stent, Diag 40p, 52m, LCX patent OM1 stent, RCA 50p, 72m, EF 65%.  . Hyperlipidemia   . Hypertension   . Obesity, unspecified   . Hypothyroidism   . Diabetes mellitus     TYPE II  . Anxiety state, unspecified   . Depressive disorder, not elsewhere classified   . Degeneration of lumbar or lumbosacral intervertebral disc   . Allergic rhinitis, cause unspecified   . Lower GI  bleed 06/2010    Diverticular bleed  . Chronic LBP   . AAA (abdominal aortic aneurysm) ([place])     a. 4.7cm in 08/2012. Followed by [facility].  . Gout 08/22/2013  . Noncompliance with medications 02/26/2014   Past Surgical History  Procedure Laterality Date  . Total abdominal hysterectomy    . Lumbar fusion  01/2007    DR. Patrice [name]...3-LEVEL WITH FIXATION  . Parathyroidectomy    . Thyroidectomy    . Cardiac catheterization      PCI OF BOTH THE CIRCUMFLEX AND LEFT ANTERIOR DESCENDING ARTERY  . Cesarean section    . Heart stent  04-2010  and  Jun 07, 2013    X 3  . Spine surgery    . Left heart catheterization with coronary angiogram N/A 06/07/2013    Procedure: LEFT HEART CATHETERIZATION WITH CORONARY ANGIOGRAM;  Surgeon: Brandi Blanks, MD;  Location: [facility] C[facility] LAB;  Service: Cardiovascular;  Laterality: N/A;  . Left heart catheterization with coronary angiogram N/A 02/25/2014    Procedure: LEFT HEART CATHETERIZATION WITH CORONARY ANGIOGRAM;  Surgeon: Brandi Sine, MD;  Location: [facility] CATH LAB;  Service: Cardiovascular;  Laterality: N/A;   Social History   Social History  . Marital Status: Divorced    Spouse Name: N/A  . Number of Children: 3  . Years of Education: N/A   Occupational History  . 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     Social History Main Topics  . Smoking status: Former Smoker -- 30 years    Types: Cigarettes    Quit date: 05/31/1986  . Smokeless tobacco: Never Used  . Alcohol Use: No  . Drug Use: No  . Sexual Activity: Not Asked   Other Topics Concern  . None   Social History Narrative   DIVORCED   3 CHILDREN   PATIENT SIGNED A DESIGNATED PARTY RELEASE TO ALLOW HER DAUGHTER, [name], TO HAVE ACCESS TO HER MEDICAL RECORDS/INFORMATION. [name], May 04, 2009 @ 3:27 PM   Smokes cigarettes on rare occasions.   Allergies  Allergen Reactions  . Prilosec [Omeprazole] Other (See Comments)    Chest pain  . Miconazole Nitrate Hives     REACTION: hives  . Prednisone Swelling    Tongue/lip swelling.  . Augmentin [Amoxicillin-Pot Clavulanate] Hives, Itching and Rash  . Doxycycline Other (See Comments)    REACTION: gi upset   Family History  Problem Relation Age of Onset  . Heart attack Mother 86    s/p D&C-CARDIAC ARREST 1966  . Heart disease Mother   . Heart attack Father 31    1978 WITH MI  . Heart disease Father   . Colon cancer Neg Hx   . Diabetes Brother   . Diabetes Maternal Aunt   . Arthritis Maternal Aunt   . Cancer Maternal Aunt     Past medical history, social, surgical and family history all reviewed in electronic medical record.  No pertanent information unless stated regarding to the chief complaint.   Review of Systems: No headache, visual changes, nausea, vomiting, diarrhea, constipation, dizziness, abdominal pain, skin rash, fevers, chills, night sweats, weight loss, swollen lymph nodes, body aches, joint swelling, muscle aches, chest pain, shortness of breath, mood changes.   Objective Blood pressure 108/72, pulse 83, height 5\' 6"  (1.676 m), weight 223 lb (101.152 kg), SpO2 97 %.  General: No apparent distress alert and oriented x3 mood and affect normal, dressed appropriately.  HEENT: Pupils equal, extraocular movements intact  Respiratory: Patient's speak in full sentences and does not appear short of breath  Cardiovascular: No lower extremity edema, non tender, no erythema  Skin: Warm dry intact with no signs of infection or rash on extremities or on axial skeleton.  Abdomen: Soft nontender  Neuro: Cranial nerves II through XII are intact, neurovascularly intact in all extremities with 2+ DTRs and 2+ pulses.  Lymph: No lymphadenopathy of posterior or anterior cervical chain or axillae bilaterally.  Gait antalgic gaitStill noted with no significant change. Ambulates with the aid of a walker MSK:  Non tender with full range of motion and good stability and symmetric strength and tone of  shoulders, elbows, wrist, , knee and ankles bilaterally. Arthritic changes of multiple joints  Hip: Left ROM IR: 5 with pain About the same as previous Deg, ER: 25 Deg, Flexion: 100 Deg, Extension: 80 Deg, Abduction: 25 Deg, Adduction: 15 Deg Strength IR: 5/5, ER: 5/5, Flexion: 5/5, Extension: 5/5, Abduction: 4/5, Adduction: 4/5 Pelvic alignment unremarkable to inspection and palpation. Standing hip rotation and gait without trendelenburg sign / unsteadiness. Greater trochanter without tenderness to palpation. No tenderness over piriformis and greater trochanter. Continued pain with internal motion. No SI joint tenderness and normal minimal SI movement. Contralateral hip has some mild decrease in internal range of motion but no pain. No change from previous exam Patient back exam shows  patient is tender to palpation in the paraspinal musculature of the lumbar spine.  more severe than previous exam. Positive straight leg test on the left side. Neurovascular intact distally with 2+ DTRs   Impression and Recommendations:     This case required medical decision making of moderate complexity.      Note: This dictation was prepared with Dragon dictation along with smaller phrase technology. Any transcriptional errors that result from this process are unintentional.

## 2015-06-30 NOTE — Progress Notes (Signed)
Pre visit review using our clinic review tool, if applicable. No additional management support is needed unless otherwise documented below in the visit note. 

## 2015-06-30 NOTE — Assessment & Plan Note (Signed)
Still believe the patient is having lumbar radiculopathy. Patient continues to have difficulty. Patient continues to have enough pain that stops her from daily activities. We discussed with patient about following up with her neurosurgeon but she does not want have any more surgery. Patient has not taken her medications on a regular basis including a gout medication which I think is contribute in. Refilled patient's gabapentin as well as muscle relaxer which she never took previously. Hopefully this will be beneficial. Warned of potential side effects. Discussed with patient to monitor blood sugars after injections today. Patient declined formal physical therapy. Patient come back and see me again in 6 weeks. Fevers chills or abnormal weight loss occurs or worsening symptoms down the leg patient will seek medical attention immediately.  Spent  25 minutes with patient face-to-face and had greater than 50% of counseling including as described above in assessment and plan.

## 2015-06-30 NOTE — Patient Instructions (Signed)
I am sorry you are not feeling well This back will be difficult 2 injections today  Gabapentin 200mg  at night zanaflex up to 3 times daily (this is a muscle relaxer) Keep trying to be active Good shoes with rigid bottom.  Jalene Mullet, [place]rell or New balance greater then 700 Ice 20 minutes 2 times daily. Usually after activity and before bed. Duexis if you really need it.  If not better you can call but all I have left would be epidural injections I would send you for.  Otherwise see me again in 6 weeks.

## 2015-06-30 NOTE — Assessment & Plan Note (Signed)
Patient does have severe arthritis of the hip. Declined any type of injection. Discussed with patient at great length. Patient is been fairly noncompliant and I do not think she'll get better until she follows conservative therapy measures. Patient does not want any surgical intervention. Under. Encourage proper shoes.

## 2015-08-10 NOTE — Progress Notes (Signed)
[name] [place], [place] [address] Phone: [phone] Subjective:    I'm seeing this patient by the request  of:  [name], MD   CC: For back pain follow-up,  [id number]  [name] is a 70 y.o. female coming in with complaint of left groin pain. Patient was found to have severe arthritis of the hips bilaterally.  Patient also has a past medical history significant for multiple back surgeries. Patient at last exam was having worsening back symptoms than actually hip pain.patient was started on the low dose gabapentin, given a muscle relaxer, discussed proper shoes in an icing regimen. Patient states she is not taking gabapentin.patient states that her back seems to be severe. Having more pain going down the legs. Make it difficult to even ambulate. States that the pain is bad at night. Worse that his been recently. Not taking medications on a regular basis. Rates the severity of 9 out of 10. No other signs and symptoms.      Past Medical History:  Diagnosis Date  . AAA (abdominal aortic aneurysm) ([place])    a. 4.7cm in 08/2012. [facility] by [facility].  . Allergic rhinitis, cause unspecified   . Anxiety state, unspecified   . Chronic LBP   . Coronary artery disease    a. s/p inferior MI 1998->PCI of RCA. b. S/p prior PCI of the OM1 and LAD;  c. 05/2013 Cath/PCI: LM nl, LAD 90p (3.0x22 Resolute DES), 51m w/ patent stent, Diag 40p, 3m, LCX patent OM1 stent, RCA 50p, 69m, EF 65%.  . Degeneration of lumbar or lumbosacral intervertebral disc   . Depressive disorder, not elsewhere classified   . Diabetes mellitus    TYPE II  . Gout 08/22/2013  . Hyperlipidemia   . Hypertension   . Hypothyroidism   . Lower GI bleed 06/2010   Diverticular bleed  . Noncompliance with medications 02/26/2014  . Obesity, unspecified    Past Surgical History:  Procedure Laterality Date  . CARDIAC CATHETERIZATION     PCI OF BOTH THE CIRCUMFLEX AND LEFT ANTERIOR  DESCENDING ARTERY  . CESAREAN SECTION    . HEART STENT  04-2010  and  Jun 07, 2013   X 3  . LEFT HEART CATHETERIZATION WITH CORONARY ANGIOGRAM N/A 06/07/2013   Procedure: LEFT HEART CATHETERIZATION WITH CORONARY ANGIOGRAM;  Surgeon: [name], MD;  Location[facility] CATH LAB;  Service: Cardiovascular;  Laterality: N/A;  . LEFT HEART CATHETERIZATION WITH CORONARY ANGIOGRAM N/A 02/25/2014   Procedure: LEFT HEART CATHETERIZATION WITH CORONARY ANGIOGRAM;  Surgeon: Troy Sine, MD;  Location: [facility] CATH LAB;  Service: Cardiovascular;  Laterality: N/A;  . LUMBAR FUSION  01/2007   DR. Patrice Paradise...3-LEVEL WITH FIXATION  . PARATHYROIDECTOMY    . SPINE SURGERY    . THYROIDECTOMY    . TOTAL ABDOMINAL HYSTERECTOMY     Social History   Social History  . Marital status: Divorced    Spouse name: N/A  . Number of children: 3  . Years of education: N/A   Occupational History  . [employer]    ADMIN SUPPORT  .  Retired   Social History Main Topics  . Smoking status: Former Smoker    Years: 30.00    Types: Cigarettes    Quit date: 05/31/1986  . Smokeless tobacco: Never Used  . Alcohol use No  . Drug use: No  . Sexual activity: Not Asked   Other Topics Concern  .  None   Social History Narrative   DIVORCED   3 CHILDREN   PATIENT SIGNED A DESIGNATED PARTY RELEASE TO ALLOW HER DAUGHTER, TRAMAINE Foti, TO HAVE ACCESS TO HER MEDICAL RECORDS/INFORMATION. [name], May 04, 2009 @ 3:27 PM   Smokes cigarettes on rare occasions.   Allergies  Allergen Reactions  . Prilosec [Omeprazole] Other (See Comments)    Chest pain  . Miconazole Nitrate Hives    REACTION: hives  . Prednisone Swelling    Tongue/lip swelling.  . Augmentin [Amoxicillin-Pot Clavulanate] Hives, Itching and Rash  . Doxycycline Other (See Comments)    REACTION: gi upset   Family History  Problem Relation Age of Onset  . Heart attack Mother 94    s/p D&C-CARDIAC ARREST 1966  . Heart disease Mother     . Heart attack Father 1    1978 WITH MI  . Heart disease Father   . Colon cancer Neg Hx   . Diabetes Brother   . Diabetes Maternal Aunt   . Arthritis Maternal Aunt   . Cancer Maternal Aunt     Past medical history, social, surgical and family history all reviewed in electronic medical record.  No pertanent information unless stated regarding to the chief complaint.   Review of Systems: No headache, visual changes, nausea, vomiting, diarrhea, constipation, dizziness, abdominal pain, skin rash, fevers, chills, night sweats, weight loss, swollen lymph nodes chest pain, shortness of breath, mood changes.   Objective  Blood pressure 122/80, pulse 67, weight 221 lb (100.2 kg), SpO2 95 %.  General: No apparent distress alert and oriented x3 mood and affect normal, dressed appropriately.  HEENT: Pupils equal, extraocular movements intact  Respiratory: Patient's speak in full sentences and does not appear short of breath  Cardiovascular: No lower extremity edema, non tender, no erythema  Skin: Warm dry intact with no signs of infection or rash on extremities or on axial skeleton.  Abdomen: Soft nontender  Neuro: Cranial nerves II through XII are intact, neurovascularly intact in all extremities with 2+ DTRs and 2+ pulses.  Lymph: No lymphadenopathy of posterior or anterior cervical chain or axillae bilaterally.  Gait antalgic gait worsening. Ambulates with the aid of a walker MSK:  Non tender with full range of motion and good stability and symmetric strength and tone of shoulders, elbows, wrist, , knee and ankles bilaterally. Arthritic changes of multiple joints  Patient back exam shows patient is tender to palpation in the paraspinal musculature of the lumbar spine.  more severe than previous exam. Positive straight leg test on the left side. Neurovascular intact distally with 2+ DTRs patient unable to do the Fabere test secondary to pain and stiffness. Patient strength of the lower Jimmy's  seemed to be symmetric. Patient is uncomfortable throughout testing.   Impression and Recommendations:     This case required medical decision making of moderate complexity.      Note: This dictation was prepared with Dragon dictation along with smaller phrase technology. Any transcriptional errors that result from this process are unintentional.

## 2015-08-11 ENCOUNTER — Ambulatory Visit (INDEPENDENT_AMBULATORY_CARE_PROVIDER_SITE_OTHER)
Admission: RE | Admit: 2015-08-11 | Discharge: 2015-08-11 | Disposition: A | Discharge status: Home / Self Care | Payer: Medicare Other | Source: Ambulatory Visit | Attending: Family Medicine | Admitting: Family Medicine

## 2015-08-11 ENCOUNTER — Ambulatory Visit (INDEPENDENT_AMBULATORY_CARE_PROVIDER_SITE_OTHER): Payer: Medicare Other | Admitting: Family Medicine

## 2015-08-11 ENCOUNTER — Encounter: Payer: Self-pay | Admitting: Family Medicine

## 2015-08-11 VITALS — BP 122/80 | HR 67 | Wt 221.0 lb

## 2015-08-11 DIAGNOSIS — M5416 Radiculopathy, lumbar region: Secondary | ICD-10-CM | POA: Diagnosis not present

## 2015-08-11 DIAGNOSIS — M545 Low back pain, unspecified: Secondary | ICD-10-CM

## 2015-08-11 DIAGNOSIS — M5136 Other intervertebral disc degeneration, lumbar region: Secondary | ICD-10-CM | POA: Diagnosis not present

## 2015-08-11 DIAGNOSIS — M79605 Pain in left leg: Secondary | ICD-10-CM

## 2015-08-11 DIAGNOSIS — I2510 Atherosclerotic heart disease of native coronary artery without angina pectoris: Secondary | ICD-10-CM | POA: Diagnosis not present

## 2015-08-11 DIAGNOSIS — M79604 Pain in right leg: Secondary | ICD-10-CM

## 2015-08-11 DIAGNOSIS — G8929 Other chronic pain: Secondary | ICD-10-CM | POA: Diagnosis not present

## 2015-08-11 MED ORDER — METHYLPREDNISOLONE ACETATE 80 MG/ML IJ SUSP
80.0000 mg | Freq: Once | INTRAMUSCULAR | Status: AC
  Administered 2015-08-11: 80 mg via INTRAMUSCULAR

## 2015-08-11 MED ORDER — KETOROLAC TROMETHAMINE 60 MG/2ML IM SOLN
60.0000 mg | Freq: Once | INTRAMUSCULAR | Status: AC
  Administered 2015-08-11: 60 mg via INTRAMUSCULAR

## 2015-08-11 MED ORDER — PREDNISONE 20 MG PO TABS: 40.0000 mg | ORAL_TABLET | Freq: Every day | ORAL | 0 refills | Status: DC

## 2015-08-11 NOTE — Assessment & Plan Note (Signed)
Worsening pain at this time. Patient did decide that she would like to try prednisone even though she does have a potential allergy. Patient will see if she does well with this. We discussed icing regimen, home exercises. We discussed which activities to avoid. 2 injections today. Patient declined formal physical therapy. Patient is attempting to avoid chronic pain medications which I do not blame her. Discuss the gabapentin though would be helpful. Patient will follow-up again in 2-3 weeks for further evaluation and treatment.no x-rays ordered as well today. To rule out any type of compression fracture. Multiple surgeries of her back in the past.  Spent  25 minutes with patient face-to-face and had greater than 50% of counseling including as described above in assessment and plan.

## 2015-08-11 NOTE — Patient Instructions (Signed)
  Get xray downstairs 2 injection to help with the pain  Prednisone 40mg  daily for 5 days starting tomorrow.  Gabapentin 200mg  at night If in worse pain take muscle relaxer as well up to 3 times a day  Tramadol if you really need it.  See me again in 2-3 weeks to make sure you are doing better.

## 2015-08-11 NOTE — Progress Notes (Signed)
Pre visit review using our clinic review tool, if applicable. No additional management support is needed unless otherwise documented below in the visit note. 

## 2015-08-27 ENCOUNTER — Other Ambulatory Visit: Payer: Self-pay | Admitting: Internal Medicine

## 2015-08-31 NOTE — Progress Notes (Signed)
[name] [place] [place], [place] [address] Phone: [phone] Subjective:    I'm seeing this patient by the request  of:  [name], MD   CC: For back pain follow-up,  [id number]  [name] is a 70 y.o. female coming in with complaint of left groin pain. Patient was found to have severe arthritis of the hips bilaterally.  Patient also has a past medical history significant for multiple back surgeries. Patient at last exam was having worsening back symptoms than actually hip pain.patient was started on the low dose gabapentin, But seemed to have lost a recently. Feels that the pain is more in the back. Patient is not taking the gabapentin regularly. Patient was to start doing that as well as given prednisone and a muscle relaxer for breakthrough pain. Patient states when she was on the prednisone did help but cannot do it regularly secondary to her diabetes. Continues to have pain that seems to be radiating down the legs.  Repeat imaging of patient's lumbar spine was independently visualized by me. Patient's posterior fixation of L3-S1 does not show any loosening. Patient has progression of degenerative disc disease and arthritic changes of L2-L3.  Patient did have an aortic abdominal aneurysm that seemed to enlarge to greater than 6 cm on x-ray. Patient is scheduled to have a another ultrasound done on 09/03/2015    Past Medical History:  Diagnosis Date  . AAA (abdominal aortic aneurysm) ([place])    a. 4.7cm in 08/2012. [facility] by [facility].  . Allergic rhinitis, cause unspecified   . Anxiety state, unspecified   . Chronic LBP   . Coronary artery disease    a. s/p inferior MI 1998->PCI of RCA. b. S/p prior PCI of the OM1 and LAD;  c. 05/2013 Cath/PCI: LM nl, LAD 90p (3.0x22 Resolute DES), 69m w/ patent stent, Diag 40p, 58m, LCX patent OM1 stent, RCA 50p, 105m, EF 65%.  . Degeneration of lumbar or lumbosacral intervertebral disc   . Depressive  disorder, not elsewhere classified   . Diabetes mellitus    TYPE II  . Gout 08/22/2013  . Hyperlipidemia   . Hypertension   . Hypothyroidism   . Lower GI bleed 06/2010   Diverticular bleed  . Noncompliance with medications 02/26/2014  . Obesity, unspecified    Past Surgical History:  Procedure Laterality Date  . CARDIAC CATHETERIZATION     PCI OF BOTH THE CIRCUMFLEX AND LEFT ANTERIOR DESCENDING ARTERY  . CESAREAN SECTION    . HEART STENT  04-2010  and  Jun 07, 2013   X 3  . LEFT HEART CATHETERIZATION WITH CORONARY ANGIOGRAM N/A 06/07/2013   Procedure: LEFT HEART CATHETERIZATION WITH CORONARY ANGIOGRAM;  Surgeon: [name], MD;  Location: [facility] CATH LAB;  Service: Cardiovascular;  Laterality: N/A;  . LEFT HEART CATHETERIZATION WITH CORONARY ANGIOGRAM N/A 02/25/2014   Procedure: LEFT HEART CATHETERIZATION WITH CORONARY ANGIOGRAM;  Surgeon: Troy Sine, MD;  Location: [facility] CATH LAB;  Service: Cardiovascular;  Laterality: N/A;  . LUMBAR FUSION  01/2007   DR. Patrice Paradise...3-LEVEL WITH FIXATION  . PARATHYROIDECTOMY    . SPINE SURGERY    . THYROIDECTOMY    . TOTAL ABDOMINAL HYSTERECTOMY     Social History   Social History  . Marital status: Divorced    Spouse name: N/A  . Number of children: 3  . Years of education: N/A   Occupational History  . [employer]  ADMIN SUPPORT  .  Retired   Social History Main Topics  . Smoking status: Former Smoker    Years: 30.00    Types: Cigarettes    Quit date: 05/31/1986  . Smokeless tobacco: Never Used  . Alcohol use No  . Drug use: No  . Sexual activity: Not on file   Other Topics Concern  . Not on file   Social History Narrative   DIVORCED   3 CHILDREN   PATIENT SIGNED A DESIGNATED PARTY RELEASE TO ALLOW HER DAUGHTER, TRAMAINE Mehring, TO HAVE ACCESS TO HER MEDICAL RECORDS/INFORMATION. [name], May 04, 2009 @ 3:27 PM   Smokes cigarettes on rare occasions.   Allergies  Allergen Reactions  . Prilosec  [Omeprazole] Other (See Comments)    Chest pain  . Miconazole Nitrate Hives    REACTION: hives  . Prednisone Swelling    Tongue/lip swelling.  . Augmentin [Amoxicillin-Pot Clavulanate] Hives, Itching and Rash  . Doxycycline Other (See Comments)    REACTION: gi upset   Family History  Problem Relation Age of Onset  . Heart attack Mother 73    s/p D&C-CARDIAC ARREST 1966  . Heart disease Mother   . Heart attack Father 62    1978 WITH MI  . Heart disease Father   . Colon cancer Neg Hx   . Diabetes Brother   . Diabetes Maternal Aunt   . Arthritis Maternal Aunt   . Cancer Maternal Aunt     Past medical history, social, surgical and family history all reviewed in electronic medical record.  No pertanent information unless stated regarding to the chief complaint.   Review of Systems: No headache, visual changes, nausea, vomiting, diarrhea, constipation, dizziness, abdominal pain, skin rash, fevers, chills, night sweats, weight loss, swollen lymph nodes chest pain, shortness of breath, mood changes.   Objective  There were no vitals taken for this visit.  General: No apparent distress alert and oriented x3 mood and affect normal, dressed appropriately.  HEENT: Pupils equal, extraocular movements intact  Respiratory: Patient's speak in full sentences and does not appear short of breath  Cardiovascular: No lower extremity edema, non tender, no erythema  Skin: Warm dry intact with no signs of infection or rash on extremities or on axial skeleton.  Abdomen: Soft nontender  Neuro: Cranial nerves II through XII are intact, neurovascularly intact in all extremities with 2+ DTRs and 2+ pulses.  Lymph: No lymphadenopathy of posterior or anterior cervical chain or axillae bilaterally.  Gait antalgic gait worsening. Ambulates with the aid of a walker MSK:  Non tender with full range of motion and good stability and symmetric strength and tone of shoulders, elbows, wrist, , knee and ankles  bilaterally. Arthritic changes of multiple joints  Patient back exam shows patient is tender to palpation in the paraspinal musculature of the lumbar spine And more tender than previous exam. Positive straight leg test on the left side. Neurovascular intact distally with 2+ DTRs patient unable to do the Fabere test secondary to pain and stiffness. 4-5 strength in the legs bilaterally.. Patient is uncomfortable throughout testing.   Impression and Recommendations:     This case required medical decision making of moderate complexity.      Note: This dictation was prepared with Dragon dictation along with smaller phrase technology. Any transcriptional errors that result from this process are unintentional.

## 2015-09-01 ENCOUNTER — Ambulatory Visit (INDEPENDENT_AMBULATORY_CARE_PROVIDER_SITE_OTHER): Payer: Medicare Other | Admitting: Family Medicine

## 2015-09-01 ENCOUNTER — Encounter: Payer: Self-pay | Admitting: Family

## 2015-09-01 ENCOUNTER — Encounter: Payer: Self-pay | Admitting: Family Medicine

## 2015-09-01 DIAGNOSIS — M5416 Radiculopathy, lumbar region: Secondary | ICD-10-CM

## 2015-09-01 DIAGNOSIS — M1040 Other secondary gout, unspecified site: Secondary | ICD-10-CM | POA: Diagnosis not present

## 2015-09-01 DIAGNOSIS — I2510 Atherosclerotic heart disease of native coronary artery without angina pectoris: Secondary | ICD-10-CM

## 2015-09-01 MED ORDER — ALLOPURINOL 300 MG PO TABS: 300.0000 mg | ORAL_TABLET | Freq: Every day | ORAL | 6 refills | Status: DC

## 2015-09-01 MED ORDER — KETOROLAC TROMETHAMINE 60 MG/2ML IM SOLN
60.0000 mg | Freq: Once | INTRAMUSCULAR | Status: AC
  Administered 2015-09-01: 60 mg via INTRAMUSCULAR

## 2015-09-01 MED ORDER — METHYLPREDNISOLONE ACETATE 80 MG/ML IJ SUSP
80.0000 mg | Freq: Once | INTRAMUSCULAR | Status: AC
  Administered 2015-09-01: 80 mg via INTRAMUSCULAR

## 2015-09-01 MED ORDER — GABAPENTIN 300 MG PO CAPS: ORAL_CAPSULE | ORAL | 3 refills | Status: DC

## 2015-09-01 NOTE — Assessment & Plan Note (Signed)
Increased. Allopurinol

## 2015-09-01 NOTE — Assessment & Plan Note (Signed)
We'll continue to monitor. Patient does not make any significant improvement possible surgical intervention as needed.

## 2015-09-01 NOTE — Assessment & Plan Note (Signed)
Continues to have radicular symptoms. Patient will be increased on her gabapentin. Nothing taking him previously. Also patient will be started on a higher dose of allopurinol at patient's creatinine been normal. I do think that this could be beneficial. Patient continues to have worsening pain she would need a CT myelogram for further evaluation with patient having fusion done previously. Patient does have advance adjacent segment disease that could be contribute in. Patient otherwise will come back in 4-6 weeks.

## 2015-09-01 NOTE — Patient Instructions (Addendum)
Go to see you  We gave you 2 injections today  You do have worsening arthritis of your back.  Ice 20 minutes 2 times daily. Usually after activity and before bed. Gabapentin 300mg  at night Allopurinol 333mkg daily  If not better by Monday call me and we will need CT myelogram.  Otherwise see me again in 4-6 weeks Make sure you go to your appointment on your ultrasound.

## 2015-09-03 ENCOUNTER — Ambulatory Visit (INDEPENDENT_AMBULATORY_CARE_PROVIDER_SITE_OTHER): Payer: Medicare Other | Admitting: Family

## 2015-09-03 ENCOUNTER — Encounter: Payer: Self-pay | Admitting: Family

## 2015-09-03 ENCOUNTER — Ambulatory Visit (HOSPITAL_COMMUNITY)
Admission: RE | Admit: 2015-09-03 | Discharge: 2015-09-03 | Disposition: A | Discharge status: Home / Self Care | Payer: Medicare Other | Source: Ambulatory Visit | Attending: Family | Admitting: Family

## 2015-09-03 VITALS — BP 112/74 | HR 64 | Ht 66.0 in | Wt 224.8 lb

## 2015-09-03 DIAGNOSIS — I2510 Atherosclerotic heart disease of native coronary artery without angina pectoris: Secondary | ICD-10-CM

## 2015-09-03 DIAGNOSIS — I714 Abdominal aortic aneurysm, without rupture, unspecified: Secondary | ICD-10-CM

## 2015-09-03 DIAGNOSIS — F17200 Nicotine dependence, unspecified, uncomplicated: Secondary | ICD-10-CM | POA: Insufficient documentation

## 2015-09-03 NOTE — Patient Instructions (Signed)
Abdominal Aortic Aneurysm An aneurysm is a weakened or damaged part of an artery wall that bulges from the normal force of blood pumping through the body. An abdominal aortic aneurysm is an aneurysm that occurs in the lower part of the aorta, the main artery of the body.  The major concern with an abdominal aortic aneurysm is that it can enlarge and burst (rupture) or blood can flow between the layers of the wall of the aorta through a tear (aorticdissection). Both of these conditions can cause bleeding inside the body and can be life threatening unless diagnosed and treated promptly. CAUSES  The exact cause of an abdominal aortic aneurysm is unknown. Some contributing factors are:   A hardening of the arteries caused by the buildup of fat and other substances in the lining of a blood vessel (arteriosclerosis).  Inflammation of the walls of an artery (arteritis).   Connective tissue diseases, such as Marfan syndrome.   Abdominal trauma.   An infection, such as syphilis or staphylococcus, in the wall of the aorta (infectious aortitis) caused by bacteria. RISK FACTORS  Risk factors that contribute to an abdominal aortic aneurysm may include:  Age older than 60 years.   High blood pressure (hypertension).  Female gender.  Ethnicity (white race).  Obesity.  Family history of aneurysm (first degree relatives only).  Tobacco use. PREVENTION  The following healthy lifestyle habits may help decrease your risk of abdominal aortic aneurysm:  Quitting smoking. Smoking can raise your blood pressure and cause arteriosclerosis.  Limiting or avoiding alcohol.  Keeping your blood pressure, blood sugar level, and cholesterol levels within normal limits.  Decreasing your salt intake. In somepeople, too much salt can raise blood pressure and increase your risk of abdominal aortic aneurysm.  Eating a diet low in saturated fats and cholesterol.  Increasing your fiber intake by including  whole grains, vegetables, and fruits in your diet. Eating these foods may help lower blood pressure.  Maintaining a healthy weight.  Staying physically active and exercising regularly. SYMPTOMS  The symptoms of abdominal aortic aneurysm may vary depending on the size and rate of growth of the aneurysm.Most grow slowly and do not have any symptoms. When symptoms do occur, they may include:  Pain (abdomen, side, lower back, or groin). The pain may vary in intensity. A sudden onset of severe pain may indicate that the aneurysm has ruptured.  Feeling full after eating only small amounts of food.  Nausea or vomiting or both.  Feeling a pulsating lump in the abdomen.  Feeling faint or passing out. DIAGNOSIS  Since most unruptured abdominal aortic aneurysms have no symptoms, they are often discovered during diagnostic exams for other conditions. An aneurysm may be found during the following procedures:  Ultrasonography (A one-time screening for abdominal aortic aneurysm by ultrasonography is also recommended for all men aged 65-75 years who have ever smoked).  X-ray exams.  A computed tomography (CT).  Magnetic resonance imaging (MRI).  Angiography or arteriography. TREATMENT  Treatment of an abdominal aortic aneurysm depends on the size of your aneurysm, your age, and risk factors for rupture. Medication to control blood pressure and pain may be used to manage aneurysms smaller than 6 cm. Regular monitoring for enlargement may be recommended by your caregiver if:  The aneurysm is 3-4 cm in size (an annual ultrasonography may be recommended).  The aneurysm is 4-4.5 cm in size (an ultrasonography every 6 months may be recommended).  The aneurysm is larger than 4.5 cm in   size (your caregiver may ask that you be examined by a vascular surgeon). If your aneurysm is larger than 6 cm, surgical repair may be recommended. There are two main methods for repair of an aneurysm:   Endovascular  repair (a minimally invasive surgery). This is done most often.  Open repair. This method is used if an endovascular repair is not possible.   This information is not intended to replace advice given to you by your health care provider. Make sure you discuss any questions you have with your health care provider.   Document Released: 10/13/2004 Document Revised: 04/30/2012 Document Reviewed: 02/03/2012 Elsevier Interactive Patient Education 2016 Elsevier Inc.    Before your next abdominal ultrasound:  Take two Extra-Strength Gas-X capsules at bedtime the night before the test. Take another two Extra-Strength Gas-X capsules 3 hours before the test.   

## 2015-09-03 NOTE — Progress Notes (Signed)
[facility] [place]   CC: Follow up Abdominal Aortic Aneurysm  History of Present Illness  [name] is a 70 y.o. (1945/06/26) female patient of Dr. Donnetta Hutching. The patient presents today for continued followup of known infrarenal abdominal aortic aneurysm. Previous studies demonstrate an AAA, measuring 4.7 cm.   She does have some chronic degenerative disc disease with back pain, with recent exacerbation, relieved by sitting, but no new abdominal pain. She has recieved ESI's for lumbar spine issues.   The patient denies claudication in legs with walking, denies non healing wounds. The patient denies history of stroke or TIA symptoms. She had a cardiac stent placed February 2016. Takes ASA 4 days/week. Her cardiologist is Dr. Burt Knack, pt states she tried Brilinta as prescribed by Dr. Burt Knack, also tried Plavix, states both medications "were messin' with me" and pt stopped taking, pt states she told the cardiologist office that she stopped Brilinta.  She takes an 81 mg ASA, states no other "blood thinners".   Pt states she was prescribed Crestor, but she is not taking; I advised pt to let her her cardiologist know this.  Pt Diabetic: Yes, A1C March 2017 was 6.6, review of records Pt smoker: she states she quit smoking at her first MI; but at her February 2017 visit she reported that she smoked when she felt stressed.  Past Medical History:  Diagnosis Date  . AAA (abdominal aortic aneurysm) ([place])    a. 4.7cm in 08/2012. Followed by [facility].  . Allergic rhinitis, cause unspecified   . Anxiety state, unspecified   . Chronic LBP   . Coronary artery disease    a. s/p inferior MI 1998->PCI of RCA. b. S/p prior PCI of the OM1 and LAD;  c. 05/2013 Cath/PCI: LM nl, LAD 90p (3.0x22 Resolute DES), 79m w/ patent stent, Diag 40p, 43m, LCX patent OM1 stent, RCA 50p, 75m, EF 65%.  . Degeneration of lumbar or lumbosacral intervertebral disc   . Depressive disorder, not elsewhere  classified   . Diabetes mellitus    TYPE II  . Gout 08/22/2013  . Hyperlipidemia   . Hypertension   . Hypothyroidism   . Lower GI bleed 06/2010   Diverticular bleed  . Noncompliance with medications 02/26/2014  . Obesity, unspecified    Past Surgical History:  Procedure Laterality Date  . CARDIAC CATHETERIZATION     PCI OF BOTH THE CIRCUMFLEX AND LEFT ANTERIOR DESCENDING ARTERY  . CESAREAN SECTION    . HEART STENT  04-2010  and  Jun 07, 2013   X 3  . LEFT HEART CATHETERIZATION WITH CORONARY ANGIOGRAM N/A 06/07/2013   Procedure: LEFT HEART CATHETERIZATION WITH CORONARY ANGIOGRAM;  Surgeon: [name], MD;  Location: [facility] CATH LAB;  Service: Cardiovascular;  Laterality: N/A;  . LEFT HEART CATHETERIZATION WITH CORONARY ANGIOGRAM N/A 02/25/2014   Procedure: LEFT HEART CATHETERIZATION WITH CORONARY ANGIOGRAM;  Surgeon: Troy Sine, MD;  Location: [facility] CATH LAB;  Service: Cardiovascular;  Laterality: N/A;  . LUMBAR FUSION  01/2007   DR. Patrice Paradise...3-LEVEL WITH FIXATION  . PARATHYROIDECTOMY    . SPINE SURGERY    . THYROIDECTOMY    . TOTAL ABDOMINAL HYSTERECTOMY     Social History Social History   Social History  . Marital status: Divorced    Spouse name: N/A  . Number of children: 3  . Years of education: N/A   Occupational History  . [employer]MIN SUPPORT  .  Retired   Social History Main Topics  . Smoking status: Former Smoker    Years: 30.00    Types: Cigarettes    Quit date: 05/31/1986  . Smokeless tobacco: Never Used  . Alcohol use No  . Drug use: No  . Sexual activity: Not on file   Other Topics Concern  . Not on file   Social History Narrative   DIVORCED   3 CHILDREN   PATIENT SIGNED A DESIGNATED PARTY RELEASE TO ALLOW HER DAUGHTER, TRAMAINE Pulley, TO HAVE ACCESS TO HER MEDICAL RECORDS/INFORMATION. [name], May 04, 2009 @ 3:27 PM   Smokes cigarettes on rare occasions.   Family History Family History  Problem Relation Age of  Onset  . Heart attack Mother 44    s/p D&C-CARDIAC ARREST 1966  . Heart disease Mother   . Heart attack Father 35    1978 WITH MI  . Heart disease Father   . Diabetes Brother   . Diabetes Maternal Aunt   . Arthritis Maternal Aunt   . Cancer Maternal Aunt   . Colon cancer Neg Hx     Current Outpatient Prescriptions on File Prior to Visit  Medication Sig Dispense Refill  . acetaminophen (TYLENOL) 325 MG tablet Take 2 tablets (650 mg total) by mouth every 4 (four) hours as needed for headache or mild pain.    . Acetaminophen-Codeine (TYLENOL/CODEINE #3) 300-30 MG tablet Take 1 tablet by mouth every 6 (six) hours as needed for pain. 40 tablet 0  . allopurinol (ZYLOPRIM) 300 MG tablet Take 1 tablet (300 mg total) by mouth daily. 30 tablet 6  . aspirin 81 MG EC tablet Take 81 mg by mouth. 4 days a week    . atenolol (TENORMIN) 25 MG tablet Take 1 tablet (25 mg total) by mouth 2 (two) times daily. 180 tablet 3  . colchicine 0.6 MG tablet Take 1 per hour for acute gout attack until improved, or until diarrhea 60 tablet 5  . gabapentin (NEURONTIN) 300 MG capsule nightly 30 capsule 3  . HYDROcodone-acetaminophen (NORCO) 10-325 MG tablet Take 1 tablet by mouth every 6 (six) hours as needed for moderate pain or severe pain.    . hydrOXYzine (ATARAX/VISTARIL) 10 MG tablet TAKE 1 TABLET BY MOUTH THREE TIMES DAILY AS NEEDED FOR ITCHING 90 tablet 0  . metFORMIN (GLUCOPHAGE-XR) 500 MG 24 hr tablet Take 4 tablets (2,000 mg total) by mouth daily with breakfast.    . metFORMIN (GLUCOPHAGE-XR) 500 MG 24 hr tablet TAKE 4 TABLETS BY MOUTH EVERY MORNING 360 tablet 0  . nitroGLYCERIN (NITROSTAT) 0.4 MG SL tablet Place 0.4 mg under the tongue every 5 (five) minutes as needed for chest pain.    . predniSONE (DELTASONE) 20 MG tablet Take 2 tablets (40 mg total) by mouth daily with breakfast. 10 tablet 0  . rosuvastatin (CRESTOR) 10 MG tablet Take 1 tablet (10 mg total) by mouth daily. 90 tablet 3  . SYNTHROID 150  MCG tablet TAKE 1 TABLET BY MOUTH EVERY DAY 90 tablet 0  . tiZANidine (ZANAFLEX) 4 MG tablet Take 1 tablet (4 mg total) by mouth every 6 (six) hours as needed for muscle spasms. 40 tablet 1  . traMADol (ULTRAM) 50 MG tablet Take 1 tablet (50 mg total) by mouth every 6 (six) hours as needed. 60 tablet 1   No current facility-administered medications on file prior to visit.    Allergies  Allergen Reactions  . Prilosec [Omeprazole] Other (See Comments)  Chest pain  . Miconazole Nitrate Hives    REACTION: hives  . Prednisone Swelling    Tongue/lip swelling.  . Augmentin [Amoxicillin-Pot Clavulanate] Hives, Itching and Rash  . Doxycycline Other (See Comments)    REACTION: gi upset    ROS: See HPI for pertinent positives and negatives.  Physical Examination  Vitals:   09/03/15 0841  BP: 112/74  Pulse: 64  SpO2: 99%  Weight: 224 lb 12.8 oz (102 kg)  Height: 5\' 6"  (1.676 m)   Body mass index is 36.28 kg/m.  General: A&O x 3, WD, obese female.  Pulmonary: Sym exp, respirations are non labored, fair air movt, no rales, rhonchi, or wheezing.  Cardiac: RRR, Nl S1, S2, no detected murmur.   Carotid Bruits Left Right   Negative Negative  Aorta is not palpable Radial pulses are 2+ palpable and =   VASCULAR EXAM:     LE Pulses LEFT RIGHT   FEMORAL 2+ palpable 2+ palpable    POPLITEAL not palpable  not palpable   POSTERIOR TIBIAL 1+ palpable  1+ palpable    DORSALIS PEDIS  ANTERIOR TIBIAL 2+ palpable  2+ palpable     Gastrointestinal: soft, NTND, -G/R, - HSM, - palpable masses, - CVAT B.  Musculoskeletal: M/S 4/5 throughout, Extremities without ischemic changes. No peripheral edema.  Neurologic: CN 2-12 intact, Pain  and light touch intact in extremities are intact except, Motor exam as listed above.   Non-Invasive Vascular Imaging (09/03/15):  ABDOMINAL AORTA DUPLEX EVALUATION    INDICATION: Abdominal aortic aneurysm    PREVIOUS INTERVENTION(S):     DUPLEX EXAM:     LOCATION DIAMETER AP (cm) DIAMETER TRANSVERSE (cm) VELOCITIES (cm/sec)  Aorta Proximal 2.5 2.8 75  Aorta Mid 3.7 4.1 73  Aorta Distal 4.8 4.9 58  Right Common Iliac Artery 1.2  109  Left Common Iliac Artery 1.2  71    Previous max aortic diameter:  4.4 x 4.4 cm Date: 02/26/15     ADDITIONAL FINDINGS: . Decreased visualization of the abdominal vasculature due to overlying bowel gas and patient body habitus. . No hemodynamically significant stenosis of the abdominal aorta and bilateral proximal common iliac arteries.    IMPRESSION: Aneurysmal dilatation of the mid and distal abdominal aorta with a maximum diameter of 4.9cm.    Compared to the previous exam:  Slight increase in the abdominal aortic aneurysm when compared to the previous exam.     Medical Decision Making  The patient is a 69 y.o. female who presents with no referable symptoms of AAA, has chronic back pain, no new back pain, no abdominal pain. . Today's abdominal duplex indicates decreased visualization of the abdominal vasculature due to overlying bowel gas and patient body habitus. No hemodynamically significant stenosis of the abdominal aorta and bilateral proximal common iliac arteries. Aneurysmal dilatation of the mid and distal abdominal aorta with a maximum diameter of 4.9cm. Slight increase in the abdominal aortic aneurysm when compared to the previous exam on 02/26/15.  Creatinine was 0.91 in March, 2017 (review of records).    Based on this patient's exam and diagnostic studies, the patient will follow up in 3 months with the following studies: AAA duplex, on a day that Dr. Donnetta Hutching is in the office, not in vein clinic.  Consideration for repair of AAA  would be made when the size is 5.5 cm, growth > 1 cm/yr, and symptomatic status.  I emphasized the importance of maximal medical management including strict control of blood pressure,  blood glucose, and lipid levels, antiplatelet agents, obtaining regular exercise, and cessation of smoking.   The patient was given information about AAA including signs, symptoms, treatment, and how to minimize the risk of enlargement and rupture of aneurysms.    The patient was advised to call 911 should the patient experience sudden onset abdominal or back pain.   Thank you for allowing [place] to participate in this patient's care.  Clemon Chambers, RN, MSN, FNP-C [facility] [place] Office: [facility] Physician: Oneida Alar  09/03/2015, 8:59 AM

## 2015-10-01 ENCOUNTER — Encounter: Payer: Self-pay | Admitting: Internal Medicine

## 2015-10-01 ENCOUNTER — Ambulatory Visit (INDEPENDENT_AMBULATORY_CARE_PROVIDER_SITE_OTHER): Payer: Medicare Other | Admitting: Internal Medicine

## 2015-10-01 VITALS — BP 140/80 | HR 80 | Temp 98.0°F | Resp 20 | Wt 222.0 lb

## 2015-10-01 DIAGNOSIS — I2510 Atherosclerotic heart disease of native coronary artery without angina pectoris: Secondary | ICD-10-CM

## 2015-10-01 DIAGNOSIS — M545 Low back pain, unspecified: Secondary | ICD-10-CM

## 2015-10-01 DIAGNOSIS — J019 Acute sinusitis, unspecified: Secondary | ICD-10-CM | POA: Diagnosis not present

## 2015-10-01 DIAGNOSIS — M25561 Pain in right knee: Secondary | ICD-10-CM | POA: Diagnosis not present

## 2015-10-01 DIAGNOSIS — Z01419 Encounter for gynecological examination (general) (routine) without abnormal findings: Secondary | ICD-10-CM

## 2015-10-01 MED ORDER — LEVOFLOXACIN 250 MG PO TABS: 250.0000 mg | ORAL_TABLET | Freq: Every day | ORAL | 0 refills | Status: AC

## 2015-10-01 MED ORDER — PREDNISONE 10 MG PO TABS: ORAL_TABLET | ORAL | 0 refills | Status: DC

## 2015-10-01 MED ORDER — HYDROCODONE-HOMATROPINE 5-1.5 MG/5ML PO SYRP: 5.0000 mL | ORAL_SOLUTION | Freq: Four times a day (QID) | ORAL | 0 refills | Status: AC | PRN

## 2015-10-01 NOTE — Progress Notes (Signed)
Pre visit review using our clinic review tool, if applicable. No additional management support is needed unless otherwise documented below in the visit note. 

## 2015-10-01 NOTE — Progress Notes (Signed)
Subjective:    Patient ID: [name], female    DOB: 23-Apr-1945, 70 y.o.   MRN: [id number]  HPI  Here with acute onset mild to mod 2-3 days ST, sinus and facial pain, HA, general weakness and malaise, with prod cough greenish sputum, but Pt denies chest pain, increased sob or doe, wheezing, orthopnea, PND, increased LE swelling, palpitations, dizziness or syncope.  Pt continues to have recurring LBP without change in severity, bowel or bladder change, fever, wt loss,  worsening LE pain/numbness/weakness, gait change or falls.  Also has recent 2 wks worsening right knee pain, swelling without giveaways or falls.  But pain now 4/10, declines pain med, no fever or trauma.  Also asks for routine GYN (female)  Past Medical History:  Diagnosis Date  . AAA (abdominal aortic aneurysm) ([place])    a. 4.7cm in 08/2012. Followed by [facility].  . Allergic rhinitis, cause unspecified   . Anxiety state, unspecified   . Chronic LBP   . Coronary artery disease    a. s/p inferior MI 1998->PCI of RCA. b. S/p prior PCI of the OM1 and LAD;  c. 05/2013 Cath/PCI: LM nl, LAD 90p (3.0x22 Resolute DES), 50m w/ patent stent, Diag 40p, 67m, LCX patent OM1 stent, RCA 50p, 67m, EF 65%.  . Degeneration of lumbar or lumbosacral intervertebral disc   . Depressive disorder, not elsewhere classified   . Diabetes mellitus    TYPE II  . Gout 08/22/2013  . Hyperlipidemia   . Hypertension   . Hypothyroidism   . Lower GI bleed 06/2010   Diverticular bleed  . Noncompliance with medications 02/26/2014  . Obesity, unspecified    Past Surgical History:  Procedure Laterality Date  . CARDIAC CATHETERIZATION     PCI OF BOTH THE CIRCUMFLEX AND LEFT ANTERIOR DESCENDING ARTERY  . CESAREAN SECTION    . HEART STENT  04-2010  and  Jun 07, 2013   X 3  . LEFT HEART CATHETERIZATION WITH CORONARY ANGIOGRAM N/A 06/07/2013   Procedure: LEFT HEART CATHETERIZATION WITH CORONARY ANGIOGRAM;  Surgeon: [name], MD;  Location: [facility] C[facility] LAB;   Service: Cardiovascular;  Laterality: N/A;  . LEFT HEART CATHETERIZATION WITH CORONARY ANGIOGRAM N/A 02/25/2014   Procedure: LEFT HEART CATHETERIZATION WITH CORONARY ANGIOGRAM;  Surgeon: Troy Sine, MD;  Location[facility] C[facility] LAB;  Service: Cardiovascular;  Laterality: N/A;  . LUMBAR FUSION  01/2007   DR. Patrice Paradise...3-LEVEL WITH FIXATION  . PARATHYROIDECTOMY    . SPINE SURGERY    . THYROIDECTOMY    . TOTAL ABDOMINAL HYSTERECTOMY      reports that she quit smoking about 29 years ago. Her smoking use included Cigarettes. She quit after 30.00 years of use. She has never used smokeless tobacco. She reports that she does not drink alcohol or use drugs. family history includes Arthritis in her maternal aunt; Cancer in her maternal aunt; Diabetes in her brother and maternal aunt; Heart attack (age of onset: 70) in her mother; Heart attack (age of onset: 73) in her father; Heart disease in her father and mother. Allergies  Allergen Reactions  . Prilosec [Omeprazole] Other (See Comments)    Chest pain  . Miconazole Nitrate Hives    REACTION: hives  . Prednisone Swelling    Tongue/lip swelling.  . Augmentin [Amoxicillin-Pot Clavulanate] Hives, Itching and Rash  . Doxycycline Other (See Comments)    REACTION: gi upset   Current Outpatient Prescriptions on File Prior to Visit  Medication Sig Dispense Refill  .  acetaminophen (TYLENOL) 325 MG tablet Take 2 tablets (650 mg total) by mouth every 4 (four) hours as needed for headache or mild pain.    . Acetaminophen-Codeine (TYLENOL/CODEINE #3) 300-30 MG tablet Take 1 tablet by mouth every 6 (six) hours as needed for pain. 40 tablet 0  . allopurinol (ZYLOPRIM) 300 MG tablet Take 1 tablet (300 mg total) by mouth daily. 30 tablet 6  . aspirin 81 MG EC tablet Take 81 mg by mouth. 4 days a week    . atenolol (TENORMIN) 25 MG tablet Take 1 tablet (25 mg total) by mouth 2 (two) times daily. 180 tablet 3  . colchicine 0.6 MG tablet Take 1 per hour for acute gout  attack until improved, or until diarrhea 60 tablet 5  . gabapentin (NEURONTIN) 300 MG capsule nightly 30 capsule 3  . HYDROcodone-acetaminophen (NORCO) 10-325 MG tablet Take 1 tablet by mouth every 6 (six) hours as needed for moderate pain or severe pain.    . hydrOXYzine (ATARAX/VISTARIL) 10 MG tablet TAKE 1 TABLET BY MOUTH THREE TIMES DAILY AS NEEDED FOR ITCHING 90 tablet 0  . metFORMIN (GLUCOPHAGE-XR) 500 MG 24 hr tablet Take 4 tablets (2,000 mg total) by mouth daily with breakfast.    . metFORMIN (GLUCOPHAGE-XR) 500 MG 24 hr tablet TAKE 4 TABLETS BY MOUTH EVERY MORNING 360 tablet 0  . nitroGLYCERIN (NITROSTAT) 0.4 MG SL tablet Place 0.4 mg under the tongue every 5 (five) minutes as needed for chest pain.    . rosuvastatin (CRESTOR) 10 MG tablet Take 1 tablet (10 mg total) by mouth daily. 90 tablet 3  . SYNTHROID 150 MCG tablet TAKE 1 TABLET BY MOUTH EVERY DAY 90 tablet 0  . tiZANidine (ZANAFLEX) 4 MG tablet Take 1 tablet (4 mg total) by mouth every 6 (six) hours as needed for muscle spasms. 40 tablet 1  . traMADol (ULTRAM) 50 MG tablet Take 1 tablet (50 mg total) by mouth every 6 (six) hours as needed. 60 tablet 1   No current facility-administered medications on file prior to visit.    Review of Systems  Constitutional: Negative for unusual diaphoresis or night sweats HENT: Negative for ear swelling or discharge Eyes: Negative for worsening visual haziness  Respiratory: Negative for choking and stridor.   Gastrointestinal: Negative for distension or worsening eructation Genitourinary: Negative for retention or change in urine volume.  Musculoskeletal: Negative for other MSK pain or swelling Skin: Negative for color change and worsening wound Neurological: Negative for tremors and numbness other than noted  Psychiatric/Behavioral: Negative for decreased concentration or agitation other than above       Objective:   Physical Exam BP 140/80   Pulse 80   Temp 98 F (36.7 C) (Oral)    Resp 20   Wt 222 lb (100.7 kg)   BMI 35.83 kg/m  VS noted, mild ill Constitutional: Pt appears in no apparent distress HENT: Head: NCAT.  Right Ear: External ear normal.  Left Ear: External ear normal.  Eyes: . Pupils are equal, round, and reactive to light. Conjunctivae and EOM are normal Bilat tm's with mild erythema.  Max sinus areas mild tender.  Pharynx with mild erythema, no exudate Neck: Normal range of motion. Neck supple.  Cardiovascular: Normal rate and regular rhythm.   Pulmonary/Chest: Effort normal and breath sounds without rales or wheezing.  Abd:  Soft, NT, ND, + BS Spine nontender Neurological: Pt is alert. Not confused , motor grossly intact Skin: Skin is warm. No rash, no  LE edema Psychiatric: Pt behavior is normal. No agitation.      Assessment & Plan:

## 2015-10-01 NOTE — Patient Instructions (Signed)
You had the pain shot today (toradol)  Please take all new medication as prescribed - the antibiotic, cough medicine, and prednisone  Please continue all other medications as before, including the tramadol as needed for pain  Please have the pharmacy call with any other refills you may need.  Please continue your efforts at being more active, low cholesterol diet, and weight control.  You are otherwise up to date with prevention measures today.  Please keep your appointments with your specialists as you may have planned  You will be contacted regarding the referral for: GYN

## 2015-10-03 NOTE — Assessment & Plan Note (Signed)
With small effusion,  Pt plans to self refer to Dr Smith/sport med ,  to f/u any worsening symptoms or concerns

## 2015-10-03 NOTE — Assessment & Plan Note (Addendum)
Stable, consider f/u with sport med in this office, to cont tramadol prn, for predpac asd,, for toradol IM, to f/u any worsening symptoms or concerns

## 2015-10-03 NOTE — Assessment & Plan Note (Signed)
Mild to mod, for antibx course,  to f/u any worsening symptoms or concerns 

## 2015-10-06 ENCOUNTER — Ambulatory Visit (INDEPENDENT_AMBULATORY_CARE_PROVIDER_SITE_OTHER): Payer: Medicare Other | Admitting: Family Medicine

## 2015-10-06 ENCOUNTER — Encounter: Payer: Self-pay | Admitting: Family Medicine

## 2015-10-06 DIAGNOSIS — M5416 Radiculopathy, lumbar region: Secondary | ICD-10-CM

## 2015-10-06 DIAGNOSIS — I2510 Atherosclerotic heart disease of native coronary artery without angina pectoris: Secondary | ICD-10-CM | POA: Diagnosis not present

## 2015-10-06 MED ORDER — METHYLPREDNISOLONE ACETATE 80 MG/ML IJ SUSP
80.0000 mg | Freq: Once | INTRAMUSCULAR | Status: AC
  Administered 2015-10-06: 80 mg via INTRAMUSCULAR

## 2015-10-06 MED ORDER — KETOROLAC TROMETHAMINE 60 MG/2ML IM SOLN
60.0000 mg | Freq: Once | INTRAMUSCULAR | Status: AC
  Administered 2015-10-06: 60 mg via INTRAMUSCULAR

## 2015-10-06 MED ORDER — VITAMIN D (ERGOCALCIFEROL) 1.25 MG (50000 UNIT) PO CAPS: 50000.0000 [IU] | ORAL_CAPSULE | ORAL | 0 refills | Status: DC

## 2015-10-06 NOTE — Patient Instructions (Signed)
Gd to see you  Continue the gabapentin  Once you can afford it get the once weekly vitamin D, I think it can help with some of the pain .  The prednisone can help as well just watch for side effects We can do these injections every 3 months if needed If worsening before then we would need a CT of your back to further evaluate and see if injections in the back could help You know where I am if you need me.

## 2015-10-06 NOTE — Progress Notes (Signed)
[name] [address], [place] [address] Phone: [phone] Subjective:    I'm seeing this patient by the request  of:  [name], MD   CC: For back pain follow-up,  [id number]  [name] is a 70 y.o. female coming in with complaint of left groin pain. Patient was found to have severe arthritis of the hips bilaterally.  Patient also has a past medical history significant for multiple back surgeries. Patient at last exam was having worsening back symptoms than actually hip pain.patient was started on the low dose gabapentin, But seemed to have lost a recently. Feels that the pain is more in the back. Patient is not taking the gabapentin regularly. Continues to have difficulty with financial constraints. Was unable to get the prednisone that was sent in for her previously as well. Patient states that the pain seems to be worsening and has helped his when she's had injections before. Patient states that it is affecting daily activities and waking her up at night. Patient does not want to do surgery again but is finding it difficult to even do regular daily activities.  Repeat imaging of patient's lumbar spine was independently visualized by me. Patient's posterior fixation of L3-S1 does not show any loosening. Patient has progression of degenerative disc disease and arthritic changes of L2-L3.  Patient did have an aortic abdominal aneurysm that seemed to enlarge to greater than 6 cm on x-ray. Patient's ultrasound in August showed no significant enlargement but at 4.9 and she is to follow-up with another ultrasound in November. No abdominal pain at the moment.    Past Medical History:  Diagnosis Date  . AAA (abdominal aortic aneurysm) ([place])    a. 4.7cm in 08/2012. Followed by [facility].  . Allergic rhinitis, cause unspecified   . Anxiety state, unspecified   . Chronic LBP   . Coronary artery disease    a. s/p inferior MI 1998->PCI of RCA. b. S/p  prior PCI of the OM1 and LAD;  c. 05/2013 Cath/PCI: LM nl, LAD 90p (3.0x22 Resolute DES), 84m w/ patent stent, Diag 40p, 32m, LCX patent OM1 stent, RCA 50p, 60m, EF 65%.  . Degeneration of lumbar or lumbosacral intervertebral disc   . Depressive disorder, not elsewhere classified   . Diabetes mellitus    TYPE II  . Gout 08/22/2013  . Hyperlipidemia   . Hypertension   . Hypothyroidism   . Lower GI bleed 06/2010   Diverticular bleed  . Noncompliance with medications 02/26/2014  . Obesity, unspecified    Past Surgical History:  Procedure Laterality Date  . CARDIAC CATHETERIZATION     PCI OF BOTH THE CIRCUMFLEX AND LEFT ANTERIOR DESCENDING ARTERY  . CESAREAN SECTION    . HEART STENT  04-2010  and  Jun 07, 2013   X 3  . LEFT HEART CATHETERIZATION WITH CORONARY ANGIOGRAM N/A 06/07/2013   Procedure: LEFT HEART CATHETERIZATION WITH CORONARY ANGIOGRAM;  Surgeon: [name], MD;  Location: [facility] CATH LAB;  Service: Cardiovascular;  Laterality: N/A;  . LEFT HEART CATHETERIZATION WITH CORONARY ANGIOGRAM N/A 02/25/2014   Procedure: LEFT HEART CATHETERIZATION WITH CORONARY ANGIOGRAM;  Surgeon: Troy Sine, MD;  Location: [facility] CATH LAB;  Service: Cardiovascular;  Laterality: N/A;  . LUMBAR FUSION  01/2007   DR. Patrice Paradise...3-LEVEL WITH FIXATION  . PARATHYROIDECTOMY    . SPINE SURGERY    . THYROIDECTOMY    . TOTAL ABDOMINAL HYSTERECTOMY     Social History  Social History  . Marital status: Divorced    Spouse name: N/A  . Number of children: 3  . Years of education: N/A   Occupational History  . [employer]    ADMIN SUPPORT  .  Retired   Social History Main Topics  . Smoking status: Former Smoker    Years: 30.00    Types: Cigarettes    Quit date: 05/31/1986  . Smokeless tobacco: Never Used  . Alcohol use No  . Drug use: No  . Sexual activity: Not Asked   Other Topics Concern  . None   Social History Narrative   DIVORCED   3 CHILDREN   PATIENT SIGNED A DESIGNATED  PARTY RELEASE TO ALLOW HER DAUGHTER, TRAMAINE Figgs, TO HAVE ACCESS TO HER MEDICAL RECORDS/INFORMATION. [name], May 04, 2009 @ 3:27 PM   Smokes cigarettes on rare occasions.   Allergies  Allergen Reactions  . Prilosec [Omeprazole] Other (See Comments)    Chest pain  . Miconazole Nitrate Hives    REACTION: hives  . Prednisone Swelling    Tongue/lip swelling.  . Augmentin [Amoxicillin-Pot Clavulanate] Hives, Itching and Rash  . Doxycycline Other (See Comments)    REACTION: gi upset   Family History  Problem Relation Age of Onset  . Heart attack Mother 100    s/p D&C-CARDIAC ARREST 1966  . Heart disease Mother   . Heart attack Father 17    1978 WITH MI  . Heart disease Father   . Diabetes Brother   . Diabetes Maternal Aunt   . Arthritis Maternal Aunt   . Cancer Maternal Aunt   . Colon cancer Neg Hx     Past medical history, social, surgical and family history all reviewed in electronic medical record.  No pertanent information unless stated regarding to the chief complaint.   Review of Systems: No headache, visual changes, nausea, vomiting, diarrhea, constipation, dizziness, abdominal pain, skin rash, fevers, chills, night sweats, weight loss, swollen lymph nodes chest pain, shortness of breath, mood changes.   Objective  Blood pressure 118/72, pulse 72, weight 221 lb (100.2 kg), SpO2 98 %.  General: No apparent distress alert and oriented x3 mood and affect normal, dressed appropriately.  HEENT: Pupils equal, extraocular movements intact  Respiratory: Patient's speak in full sentences and does not appear short of breath  Cardiovascular: No lower extremity edema, non tender, no erythema  Skin: Warm dry intact with no signs of infection or rash on extremities or on axial skeleton.  Abdomen: Soft nontender  Neuro: Cranial nerves II through XII are intact, neurovascularly intact in all extremities with 2+ DTRs and 2+ pulses.  Lymph: No lymphadenopathy of posterior or  anterior cervical chain or axillae bilaterally.  Gait antalgic gait worsening. Ambulates with the aid of a cane today  MSK:  Non tender with full range of motion and good stability and symmetric strength and tone of shoulders, elbows, wrist, , knee and ankles bilaterally. Arthritic changes of multiple joints  Patient back exam shows patient worsening tender to palpation in the paraspinal musculature of the lumbar spine  Positive straight leg test on the left side. Neurovascular intact distally with 2+ DTRs patient unable to do the Fabere test secondary to pain and stiffness. 4/5 strength in the legs bilaterally but seems a little weaker..    Impression and Recommendations:     This case required medical decision making of moderate complexity.      Note: This dictation was prepared with  Dragon dictation along with smaller Company secretary. Any transcriptional errors that result from this process are unintentional.

## 2015-10-06 NOTE — Assessment & Plan Note (Signed)
Being followed by vascular. Patient knows to seek medical attention immediately if stomach pain.

## 2015-10-06 NOTE — Assessment & Plan Note (Signed)
We'll monitor blood sugars closely for the next 3 days.

## 2015-10-06 NOTE — Assessment & Plan Note (Signed)
Significant history of back surgeries. Patient continues to have radicular symptoms though. Patient's radicular symptoms is likely something we'll not be able to help until she starts taking the gabapentin regular basis. Patient having a progression of L2-L3 concern for worsening spinal stenosis at this level. We discussed with patient that if she does not seem to be making any significant improvement we need to consider advanced imaging with a CT myelogram. Depending on the findings of that she could be a candidate for an epidural. Patient once avoid any type of surgical intervention. We discussed with patient to monitor for any type of abdominal pain and she would need to seek medical attention immediately. Spent  25 minutes with patient face-to-face and had greater than 50% of counseling including as described above in assessment and plan.

## 2015-10-29 ENCOUNTER — Emergency Department (HOSPITAL_COMMUNITY): Payer: Medicare Other

## 2015-10-29 ENCOUNTER — Encounter (HOSPITAL_COMMUNITY)
Admission: EM | Disposition: A | Discharge status: Home / Self Care | Payer: Self-pay | Source: Home / Self Care | Attending: Cardiology

## 2015-10-29 ENCOUNTER — Emergency Department (HOSPITAL_BASED_OUTPATIENT_CLINIC_OR_DEPARTMENT_OTHER): Payer: Medicare Other

## 2015-10-29 ENCOUNTER — Encounter (HOSPITAL_COMMUNITY): Payer: Self-pay | Admitting: *Deleted

## 2015-10-29 ENCOUNTER — Inpatient Hospital Stay (HOSPITAL_COMMUNITY)
Admission: EM | Admit: 2015-10-29 | Discharge: 2015-10-31 | DRG: 247 | Disposition: A | Discharge status: Home / Self Care | Payer: Medicare Other | Attending: Cardiology | Admitting: Cardiology

## 2015-10-29 DIAGNOSIS — M519 Unspecified thoracic, thoracolumbar and lumbosacral intervertebral disc disorder: Secondary | ICD-10-CM | POA: Diagnosis present

## 2015-10-29 DIAGNOSIS — Z981 Arthrodesis status: Secondary | ICD-10-CM

## 2015-10-29 DIAGNOSIS — Z87891 Personal history of nicotine dependence: Secondary | ICD-10-CM | POA: Diagnosis not present

## 2015-10-29 DIAGNOSIS — T463X5A Adverse effect of coronary vasodilators, initial encounter: Secondary | ICD-10-CM | POA: Diagnosis not present

## 2015-10-29 DIAGNOSIS — Z7982 Long term (current) use of aspirin: Secondary | ICD-10-CM

## 2015-10-29 DIAGNOSIS — Z7984 Long term (current) use of oral hypoglycemic drugs: Secondary | ICD-10-CM

## 2015-10-29 DIAGNOSIS — E785 Hyperlipidemia, unspecified: Secondary | ICD-10-CM | POA: Diagnosis not present

## 2015-10-29 DIAGNOSIS — Z79899 Other long term (current) drug therapy: Secondary | ICD-10-CM | POA: Diagnosis not present

## 2015-10-29 DIAGNOSIS — E669 Obesity, unspecified: Secondary | ICD-10-CM | POA: Diagnosis present

## 2015-10-29 DIAGNOSIS — I252 Old myocardial infarction: Secondary | ICD-10-CM | POA: Diagnosis not present

## 2015-10-29 DIAGNOSIS — Z79891 Long term (current) use of opiate analgesic: Secondary | ICD-10-CM

## 2015-10-29 DIAGNOSIS — T82855A Stenosis of coronary artery stent, initial encounter: Secondary | ICD-10-CM | POA: Diagnosis present

## 2015-10-29 DIAGNOSIS — N959 Unspecified menopausal and perimenopausal disorder: Secondary | ICD-10-CM | POA: Diagnosis present

## 2015-10-29 DIAGNOSIS — I200 Unstable angina: Secondary | ICD-10-CM | POA: Diagnosis not present

## 2015-10-29 DIAGNOSIS — E119 Type 2 diabetes mellitus without complications: Secondary | ICD-10-CM | POA: Diagnosis present

## 2015-10-29 DIAGNOSIS — R079 Chest pain, unspecified: Secondary | ICD-10-CM

## 2015-10-29 DIAGNOSIS — E892 Postprocedural hypoparathyroidism: Secondary | ICD-10-CM | POA: Diagnosis present

## 2015-10-29 DIAGNOSIS — Z888 Allergy status to other drugs, medicaments and biological substances status: Secondary | ICD-10-CM

## 2015-10-29 DIAGNOSIS — I10 Essential (primary) hypertension: Secondary | ICD-10-CM | POA: Diagnosis present

## 2015-10-29 DIAGNOSIS — E039 Hypothyroidism, unspecified: Secondary | ICD-10-CM | POA: Diagnosis present

## 2015-10-29 DIAGNOSIS — M545 Low back pain: Secondary | ICD-10-CM | POA: Diagnosis present

## 2015-10-29 DIAGNOSIS — I714 Abdominal aortic aneurysm, without rupture: Secondary | ICD-10-CM | POA: Diagnosis not present

## 2015-10-29 DIAGNOSIS — Z955 Presence of coronary angioplasty implant and graft: Secondary | ICD-10-CM

## 2015-10-29 DIAGNOSIS — Z881 Allergy status to other antibiotic agents status: Secondary | ICD-10-CM

## 2015-10-29 DIAGNOSIS — G4440 Drug-induced headache, not elsewhere classified, not intractable: Secondary | ICD-10-CM | POA: Diagnosis not present

## 2015-10-29 DIAGNOSIS — E7800 Pure hypercholesterolemia, unspecified: Secondary | ICD-10-CM | POA: Diagnosis not present

## 2015-10-29 DIAGNOSIS — M1612 Unilateral primary osteoarthritis, left hip: Secondary | ICD-10-CM | POA: Diagnosis present

## 2015-10-29 DIAGNOSIS — Z880 Allergy status to penicillin: Secondary | ICD-10-CM

## 2015-10-29 DIAGNOSIS — E782 Mixed hyperlipidemia: Secondary | ICD-10-CM | POA: Diagnosis present

## 2015-10-29 DIAGNOSIS — M109 Gout, unspecified: Secondary | ICD-10-CM | POA: Diagnosis not present

## 2015-10-29 DIAGNOSIS — Z833 Family history of diabetes mellitus: Secondary | ICD-10-CM

## 2015-10-29 DIAGNOSIS — I739 Peripheral vascular disease, unspecified: Secondary | ICD-10-CM | POA: Diagnosis present

## 2015-10-29 DIAGNOSIS — Z6836 Body mass index (BMI) 36.0-36.9, adult: Secondary | ICD-10-CM

## 2015-10-29 DIAGNOSIS — Z8249 Family history of ischemic heart disease and other diseases of the circulatory system: Secondary | ICD-10-CM

## 2015-10-29 DIAGNOSIS — Z23 Encounter for immunization: Secondary | ICD-10-CM | POA: Diagnosis not present

## 2015-10-29 DIAGNOSIS — Z90710 Acquired absence of both cervix and uterus: Secondary | ICD-10-CM

## 2015-10-29 DIAGNOSIS — I25110 Atherosclerotic heart disease of native coronary artery with unstable angina pectoris: Principal | ICD-10-CM

## 2015-10-29 HISTORY — PX: CARDIAC CATHETERIZATION: SHX172

## 2015-10-29 LAB — CBC
HCT: 42 % (ref 36.0–46.0)
Hemoglobin: 13.8 g/dL (ref 12.0–15.0)
MCH: 28.7 pg (ref 26.0–34.0)
MCHC: 32.9 g/dL (ref 30.0–36.0)
MCV: 87.3 fL (ref 78.0–100.0)
PLATELETS: 282 10*3/uL (ref 150–400)
RBC: 4.81 MIL/uL (ref 3.87–5.11)
RDW: 14.5 % (ref 11.5–15.5)
WBC: 10.6 10*3/uL — AB (ref 4.0–10.5)

## 2015-10-29 LAB — POCT ACTIVATED CLOTTING TIME
ACTIVATED CLOTTING TIME: 235 s
ACTIVATED CLOTTING TIME: 307 s
ACTIVATED CLOTTING TIME: 274 s
ACTIVATED CLOTTING TIME: 318 s

## 2015-10-29 LAB — BASIC METABOLIC PANEL
Anion gap: 12 (ref 5–15)
BUN: 8 mg/dL (ref 6–20)
CALCIUM: 8.4 mg/dL — AB (ref 8.9–10.3)
CHLORIDE: 103 mmol/L (ref 101–111)
CO2: 24 mmol/L (ref 22–32)
CREATININE: 0.9 mg/dL (ref 0.44–1.00)
Glucose, Bld: 146 mg/dL — ABNORMAL HIGH (ref 65–99)
Potassium: 3.7 mmol/L (ref 3.5–5.1)
SODIUM: 139 mmol/L (ref 135–145)

## 2015-10-29 LAB — ECHOCARDIOGRAM COMPLETE
HEIGHTINCHES: 66 in
WEIGHTICAEL: 3534.41 [oz_av]

## 2015-10-29 LAB — TROPONIN I
TROPONIN I: 0.03 ng/mL — AB (ref <0.03)
Troponin I: <0.03 ng/mL (ref <0.03)
Troponin I: <0.03 ng/mL (ref <0.03)

## 2015-10-29 LAB — GLUCOSE, CAPILLARY: GLUCOSE-CAPILLARY: 183 mg/dL — AB (ref 65–99)

## 2015-10-29 LAB — I-STAT TROPONIN, ED: TROPONIN I, POC: 0.01 ng/mL (ref 0.00–0.08)

## 2015-10-29 LAB — MRSA PCR SCREENING: MRSA BY PCR: NEGATIVE

## 2015-10-29 SURGERY — CORONARY STENT INTERVENTION

## 2015-10-29 MED ORDER — SODIUM CHLORIDE 0.9% FLUSH: 3.0000 mL | INTRAVENOUS | Status: DC | PRN

## 2015-10-29 MED ORDER — FENTANYL CITRATE (PF) 100 MCG/2ML IJ SOLN
INTRAMUSCULAR | Status: AC
  Filled 2015-10-29: qty 2

## 2015-10-29 MED ORDER — FENTANYL CITRATE (PF) 100 MCG/2ML IJ SOLN
INTRAMUSCULAR | Status: DC | PRN
  Administered 2015-10-29: 25 ug via INTRAVENOUS
  Administered 2015-10-29: 50 ug via INTRAVENOUS
  Administered 2015-10-29 (×2): 25 ug via INTRAVENOUS

## 2015-10-29 MED ORDER — HEPARIN (PORCINE) IN NACL 2-0.9 UNIT/ML-% IJ SOLN
INTRAMUSCULAR | Status: DC | PRN
  Administered 2015-10-29 (×3): 500 mL

## 2015-10-29 MED ORDER — IOPAMIDOL (ISOVUE-370) INJECTION 76%
INTRAVENOUS | Status: AC
  Filled 2015-10-29: qty 50

## 2015-10-29 MED ORDER — ONDANSETRON HCL 4 MG/2ML IJ SOLN
INTRAMUSCULAR | Status: DC | PRN
  Administered 2015-10-29: 4 mg via INTRAVENOUS

## 2015-10-29 MED ORDER — SODIUM CHLORIDE 0.9% FLUSH
3.0000 mL | Freq: Two times a day (BID) | INTRAVENOUS | Status: DC
  Administered 2015-10-30 – 2015-10-31 (×3): 3 mL via INTRAVENOUS

## 2015-10-29 MED ORDER — MIDAZOLAM HCL 2 MG/2ML IJ SOLN
INTRAMUSCULAR | Status: DC | PRN
  Administered 2015-10-29 (×5): 1 mg via INTRAVENOUS

## 2015-10-29 MED ORDER — ASPIRIN 81 MG PO CHEW
CHEWABLE_TABLET | ORAL | Status: DC | PRN
  Administered 2015-10-29: 324 mg via ORAL

## 2015-10-29 MED ORDER — ACETAMINOPHEN 325 MG PO TABS: 650.0000 mg | ORAL_TABLET | ORAL | Status: DC | PRN

## 2015-10-29 MED ORDER — INFLUENZA VAC SPLIT QUAD 0.5 ML IM SUSY
0.5000 mL | PREFILLED_SYRINGE | INTRAMUSCULAR | Status: AC
  Administered 2015-10-30: 0.5 mL via INTRAMUSCULAR
  Filled 2015-10-29: qty 0.5

## 2015-10-29 MED ORDER — IOPAMIDOL (ISOVUE-370) INJECTION 76%
INTRAVENOUS | Status: AC
  Filled 2015-10-29: qty 100

## 2015-10-29 MED ORDER — ALLOPURINOL 300 MG PO TABS
300.0000 mg | ORAL_TABLET | Freq: Every day | ORAL | Status: DC
  Administered 2015-10-30 – 2015-10-31 (×2): 300 mg via ORAL
  Filled 2015-10-29 (×2): qty 1

## 2015-10-29 MED ORDER — NITROGLYCERIN IN D5W 200-5 MCG/ML-% IV SOLN: 0.0000 ug/min | INTRAVENOUS | Status: DC

## 2015-10-29 MED ORDER — TICAGRELOR 90 MG PO TABS
90.0000 mg | ORAL_TABLET | Freq: Two times a day (BID) | ORAL | Status: DC
  Administered 2015-10-29 – 2015-10-31 (×4): 90 mg via ORAL
  Filled 2015-10-29 (×4): qty 1

## 2015-10-29 MED ORDER — MIDAZOLAM HCL 2 MG/2ML IJ SOLN
INTRAMUSCULAR | Status: AC
  Filled 2015-10-29: qty 2

## 2015-10-29 MED ORDER — HEPARIN SODIUM (PORCINE) 1000 UNIT/ML IJ SOLN
INTRAMUSCULAR | Status: DC | PRN
  Administered 2015-10-29: 2000 [IU] via INTRAVENOUS
  Administered 2015-10-29: 5000 [IU] via INTRAVENOUS
  Administered 2015-10-29: 4000 [IU] via INTRAVENOUS
  Administered 2015-10-29: 2000 [IU] via INTRAVENOUS
  Administered 2015-10-29: 3000 [IU] via INTRAVENOUS

## 2015-10-29 MED ORDER — IOPAMIDOL (ISOVUE-370) INJECTION 76%
INTRAVENOUS | Status: DC | PRN
  Administered 2015-10-29: 330 mL via INTRA_ARTERIAL

## 2015-10-29 MED ORDER — SODIUM CHLORIDE 0.9 % IV SOLN: INTRAVENOUS | Status: AC

## 2015-10-29 MED ORDER — NITROGLYCERIN 0.4 MG SL SUBL: 0.4000 mg | SUBLINGUAL_TABLET | SUBLINGUAL | Status: DC | PRN

## 2015-10-29 MED ORDER — LIDOCAINE HCL (PF) 1 % IJ SOLN
INTRAMUSCULAR | Status: DC | PRN
  Administered 2015-10-29: 1 mL

## 2015-10-29 MED ORDER — ONDANSETRON HCL 4 MG/2ML IJ SOLN
INTRAMUSCULAR | Status: AC
  Filled 2015-10-29: qty 2

## 2015-10-29 MED ORDER — MORPHINE SULFATE (PF) 2 MG/ML IV SOLN: 2.0000 mg | INTRAVENOUS | Status: DC | PRN

## 2015-10-29 MED ORDER — HEPARIN SODIUM (PORCINE) 1000 UNIT/ML IJ SOLN
INTRAMUSCULAR | Status: AC
  Filled 2015-10-29: qty 1

## 2015-10-29 MED ORDER — ATENOLOL 25 MG PO TABS
25.0000 mg | ORAL_TABLET | Freq: Every day | ORAL | Status: DC
  Administered 2015-10-30 – 2015-10-31 (×2): 25 mg via ORAL
  Filled 2015-10-29 (×2): qty 1

## 2015-10-29 MED ORDER — ONDANSETRON HCL 4 MG/2ML IJ SOLN: 4.0000 mg | Freq: Four times a day (QID) | INTRAMUSCULAR | Status: DC | PRN

## 2015-10-29 MED ORDER — LEVOTHYROXINE SODIUM 75 MCG PO TABS
150.0000 ug | ORAL_TABLET | Freq: Every day | ORAL | Status: DC
  Administered 2015-10-30 – 2015-10-31 (×2): 150 ug via ORAL
  Filled 2015-10-29 (×2): qty 2

## 2015-10-29 MED ORDER — ATENOLOL 25 MG PO TABS: 25.0000 mg | ORAL_TABLET | Freq: Two times a day (BID) | ORAL | Status: DC

## 2015-10-29 MED ORDER — ENOXAPARIN SODIUM 40 MG/0.4ML ~~LOC~~ SOLN
40.0000 mg | SUBCUTANEOUS | Status: DC
  Administered 2015-10-30 – 2015-10-31 (×2): 40 mg via SUBCUTANEOUS
  Filled 2015-10-29 (×2): qty 0.4

## 2015-10-29 MED ORDER — TRAMADOL HCL 50 MG PO TABS: 50.0000 mg | ORAL_TABLET | Freq: Four times a day (QID) | ORAL | Status: DC | PRN

## 2015-10-29 MED ORDER — ASPIRIN 325 MG PO TABS
ORAL_TABLET | ORAL | Status: AC
  Filled 2015-10-29: qty 1

## 2015-10-29 MED ORDER — ADENOSINE 12 MG/4ML IV SOLN
INTRAVENOUS | Status: AC
  Filled 2015-10-29: qty 16

## 2015-10-29 MED ORDER — GABAPENTIN 300 MG PO CAPS
300.0000 mg | ORAL_CAPSULE | Freq: Every day | ORAL | Status: DC
  Administered 2015-10-29 – 2015-10-30 (×2): 300 mg via ORAL
  Filled 2015-10-29 (×2): qty 1

## 2015-10-29 MED ORDER — SODIUM CHLORIDE 0.9 % IV SOLN: 250.0000 mL | INTRAVENOUS | Status: DC | PRN

## 2015-10-29 MED ORDER — HEPARIN BOLUS VIA INFUSION
4000.0000 [IU] | Freq: Once | INTRAVENOUS | Status: AC
  Administered 2015-10-29: 4000 [IU] via INTRAVENOUS
  Filled 2015-10-29: qty 4000

## 2015-10-29 MED ORDER — NITROGLYCERIN IN D5W 200-5 MCG/ML-% IV SOLN
INTRAVENOUS | Status: DC | PRN
  Administered 2015-10-29: 10 ug/min via INTRAVENOUS

## 2015-10-29 MED ORDER — HEPARIN (PORCINE) IN NACL 100-0.45 UNIT/ML-% IJ SOLN
1100.0000 [IU]/h | INTRAMUSCULAR | Status: DC
  Administered 2015-10-29: 1100 [IU]/h via INTRAVENOUS
  Filled 2015-10-29: qty 250

## 2015-10-29 MED ORDER — HYDROCODONE-ACETAMINOPHEN 10-325 MG PO TABS: 1.0000 | ORAL_TABLET | Freq: Four times a day (QID) | ORAL | Status: DC | PRN

## 2015-10-29 MED ORDER — NITROGLYCERIN 1 MG/10 ML FOR IR/CATH LAB
INTRA_ARTERIAL | Status: DC | PRN
  Administered 2015-10-29 (×2): 200 ug via INTRACORONARY

## 2015-10-29 MED ORDER — MORPHINE SULFATE (PF) 10 MG/ML IV SOLN
INTRAVENOUS | Status: DC | PRN
  Administered 2015-10-29 (×2): 2 mg via INTRAVENOUS

## 2015-10-29 MED ORDER — METOPROLOL TARTRATE 5 MG/5ML IV SOLN
INTRAVENOUS | Status: AC
  Filled 2015-10-29: qty 5

## 2015-10-29 MED ORDER — NITROGLYCERIN 1 MG/10 ML FOR IR/CATH LAB
INTRA_ARTERIAL | Status: AC
  Filled 2015-10-29: qty 10

## 2015-10-29 MED ORDER — VERAPAMIL HCL 2.5 MG/ML IV SOLN
INTRAVENOUS | Status: DC | PRN
  Administered 2015-10-29 (×2): 10 mL via INTRA_ARTERIAL

## 2015-10-29 MED ORDER — ASPIRIN EC 81 MG PO TBEC
81.0000 mg | DELAYED_RELEASE_TABLET | Freq: Every day | ORAL | Status: DC
  Administered 2015-10-30 – 2015-10-31 (×2): 81 mg via ORAL
  Filled 2015-10-29 (×2): qty 1

## 2015-10-29 MED ORDER — LIDOCAINE HCL (PF) 1 % IJ SOLN
INTRAMUSCULAR | Status: AC
  Filled 2015-10-29: qty 30

## 2015-10-29 MED ORDER — ADENOSINE (DIAGNOSTIC) 140MCG/KG/MIN
INTRAVENOUS | Status: DC | PRN
  Administered 2015-10-29: 140 ug/kg/min via INTRAVENOUS

## 2015-10-29 MED ORDER — ASPIRIN 81 MG PO CHEW
CHEWABLE_TABLET | ORAL | Status: AC
  Filled 2015-10-29: qty 4

## 2015-10-29 MED ORDER — NITROGLYCERIN IN D5W 200-5 MCG/ML-% IV SOLN
INTRAVENOUS | Status: AC
  Filled 2015-10-29: qty 250

## 2015-10-29 MED ORDER — SODIUM CHLORIDE 0.9 % IV BOLUS (SEPSIS)
500.0000 mL | Freq: Once | INTRAVENOUS | Status: AC
  Administered 2015-10-29: 500 mL via INTRAVENOUS

## 2015-10-29 MED ORDER — VERAPAMIL HCL 2.5 MG/ML IV SOLN
INTRAVENOUS | Status: AC
  Filled 2015-10-29: qty 4

## 2015-10-29 MED ORDER — ROSUVASTATIN CALCIUM 10 MG PO TABS
10.0000 mg | ORAL_TABLET | Freq: Every day | ORAL | Status: DC
  Administered 2015-10-30 – 2015-10-31 (×2): 10 mg via ORAL
  Filled 2015-10-29 (×2): qty 1

## 2015-10-29 MED ORDER — METOPROLOL TARTRATE 5 MG/5ML IV SOLN
INTRAVENOUS | Status: DC | PRN
  Administered 2015-10-29: 5 mg via INTRAVENOUS

## 2015-10-29 MED ORDER — TICAGRELOR 90 MG PO TABS
ORAL_TABLET | ORAL | Status: AC
  Filled 2015-10-29: qty 2

## 2015-10-29 MED ORDER — MORPHINE SULFATE (PF) 10 MG/ML IV SOLN
INTRAVENOUS | Status: AC
  Filled 2015-10-29: qty 1

## 2015-10-29 MED ORDER — TICAGRELOR 90 MG PO TABS
ORAL_TABLET | ORAL | Status: DC | PRN
  Administered 2015-10-29: 180 mg via ORAL

## 2015-10-29 SURGICAL SUPPLY — 24 items
BALLN ANGIOSCULPT RX 2.5X10 (BALLOONS) ×2
BALLN EMERGE MR 2.25X15 (BALLOONS) ×2
BALLN EMERGE MR 3.0X12 (BALLOONS) ×2
BALLN ~~LOC~~ EUPHORA RX 3.25X15 (BALLOONS) ×2
BALLOON ANGIOSCULPT RX 2.5X10 (BALLOONS) IMPLANT
BALLOON EMERGE MR 2.25X15 (BALLOONS) IMPLANT
BALLOON EMERGE MR 3.0X12 (BALLOONS) IMPLANT
BALLOON ~~LOC~~ EUPHORA RX 3.25X15 (BALLOONS) IMPLANT
CATH IMPULSE 5F ANG/FL3.5 (CATHETERS) ×1 IMPLANT
CATH VISTA GUIDE 6FR 3DRC (CATHETERS) ×1 IMPLANT
DEVICE RAD COMP TR BAND LRG (VASCULAR PRODUCTS) ×1 IMPLANT
GLIDESHEATH SLEND A-KIT 6F 22G (SHEATH) ×1 IMPLANT
GUIDEWIRE ANGLED .035X150CM (WIRE) ×1 IMPLANT
GUIDEWIRE PRESSURE COMET II (WIRE) ×1 IMPLANT
KIT ENCORE 26 ADVANTAGE (KITS) ×1 IMPLANT
KIT HEART LEFT (KITS) ×2 IMPLANT
PACK CARDIAC CATHETERIZATION (CUSTOM PROCEDURE TRAY) ×2 IMPLANT
STENT PROMUS PREM MR 3.0X12 (Permanent Stent) ×1 IMPLANT
STENT PROMUS PREM MR 3.0X16 (Permanent Stent) ×1 IMPLANT
STENT PROMUS PREM MR 3.0X28 (Permanent Stent) ×1 IMPLANT
TRANSDUCER W/STOPCOCK (MISCELLANEOUS) ×2 IMPLANT
TUBING CIL FLEX 10 FLL-RA (TUBING) ×2 IMPLANT
WIRE HI TORQ VERSACORE-J 145CM (WIRE) ×1 IMPLANT
WIRE SAFE-T 1.5MM-J .035X260CM (WIRE) ×1 IMPLANT

## 2015-10-29 NOTE — Progress Notes (Signed)
  Echocardiogram 2D Echocardiogram has been performed.  [name] 10/29/2015, 9:47 AM

## 2015-10-29 NOTE — Progress Notes (Signed)
Attempted to get report from ed.

## 2015-10-29 NOTE — H&P (Signed)
Cardiology Consult    Patient ID: TRU SAINZ MRN: [id number], DOB/AGE: 1945/12/29   Admit date: 10/29/2015 Date of Consult: 10/29/2015  Primary Physician: [name], MD  Primary Cardiologist: Dr. Burt Knack   Patient Profile    Ms. Rosenzweig is a 70 year old female with a past medical history of CAD s/p inferior MI in 1998 with PCI to RCA, prior PCI of the OM1 and LAD, 2015 DES to LAD, DM, HTN, and HLD. She presented with chest pain.   History of Present Illness    Ms. Cansler got up to use the bathroom this am and developed chest pain that she described as substernal pressure with radiation to her right arm and throat. This is her anginal equivalent.   She took 2 SL Nitro and her pain did not abate, so she called EMS. She got 3 additional SL Nitro and had moderate relief of her pain. At the time of my encounter, she was still having chest pressure at the time of my encounter that she rates as a 6/10. Her BP is soft with SBP's in the 90's, so Nitro gtt was not started.   She tells me that she takes all of her medications with high compliance. She is not a smoker, denies ETOH use.   Past Medical History   Past Medical History:  Diagnosis Date  . AAA (abdominal aortic aneurysm) ([place])    a. 4.7cm in 08/2012. Followed by [facility].  . Allergic rhinitis, cause unspecified   . Anxiety state, unspecified   . Chronic LBP   . Coronary artery disease    a. s/p inferior MI 1998->PCI of RCA. b. S/p prior PCI of the OM1 and LAD;  c. 05/2013 Cath/PCI: LM nl, LAD 90p (3.0x22 Resolute DES), 95m w/ patent stent, Diag 40p, 26m, LCX patent OM1 stent, RCA 50p, 57m, EF 65%.  . Degeneration of lumbar or lumbosacral intervertebral disc   . Depressive disorder, not elsewhere classified   . Diabetes mellitus    TYPE II  . Gout 08/22/2013  . Hyperlipidemia   . Hypertension   . Hypothyroidism   . Lower GI bleed 06/2010   Diverticular bleed  . Noncompliance with medications 02/26/2014  . Obesity, unspecified      Past Surgical History:  Procedure Laterality Date  . CARDIAC CATHETERIZATION     PCI OF BOTH THE CIRCUMFLEX AND LEFT ANTERIOR DESCENDING ARTERY  . CESAREAN SECTION    . HEART STENT  04-2010  and  Jun 07, 2013   X 3  . LEFT HEART CATHETERIZATION WITH CORONARY ANGIOGRAM N/A 06/07/2013   Procedure: LEFT HEART CATHETERIZATION WITH CORONARY ANGIOGRAM;  Surgeon: [name], MD;  Location: [facility] CATH LAB;  Service: Cardiovascular;  Laterality: N/A;  . LEFT HEART CATHETERIZATION WITH CORONARY ANGIOGRAM N/A 02/25/2014   Procedure: LEFT HEART CATHETERIZATION WITH CORONARY ANGIOGRAM;  Surgeon: Troy Sine, MD;  Location: [facility] CATH LAB;  Service: Cardiovascular;  Laterality: N/A;  . LUMBAR FUSION  01/2007   DR. Patrice Paradise...3-LEVEL WITH FIXATION  . PARATHYROIDECTOMY    . SPINE SURGERY    . THYROIDECTOMY    . TOTAL ABDOMINAL HYSTERECTOMY       Allergies  Allergies  Allergen Reactions  . Prilosec [Omeprazole] Other (See Comments)    Chest pain  . Miconazole Nitrate Hives    REACTION: hives  . Prednisone Swelling    Tongue/lip swelling.  . Augmentin [Amoxicillin-Pot Clavulanate] Hives, Itching and Rash  . Doxycycline Other (See Comments)  REACTION: gi upset   Meds  Prior to Admission medications   Medication Sig Start Date End Date Taking? Authorizing Provider  acetaminophen (TYLENOL) 325 MG tablet Take 2 tablets (650 mg total) by mouth every 4 (four) hours as needed for headache or mild pain. 02/26/14   Isaiah Serge, NP  Acetaminophen-Codeine (TYLENOL/CODEINE #3) 300-30 MG tablet Take 1 tablet by mouth every 6 (six) hours as needed for pain. 05/14/15   Biagio Borg, MD  allopurinol (ZYLOPRIM) 300 MG tablet Take 1 tablet (300 mg total) by mouth daily. 09/01/15   Lyndal Pulley, DO  aspirin 81 MG EC tablet Take 81 mg by mouth. 4 days a week    Historical Provider, MD  atenolol (TENORMIN) 25 MG tablet Take 1 tablet (25 mg total) by mouth 2 (two) times daily. 07/14/14   Biagio Borg, MD   colchicine 0.6 MG tablet Take 1 per hour for acute gout attack until improved, or until diarrhea 01/23/15   Biagio Borg, MD  gabapentin (NEURONTIN) 300 MG capsule nightly 09/01/15   Lyndal Pulley, DO  HYDROcodone-acetaminophen (NORCO) 10-325 MG tablet Take 1 tablet by mouth every 6 (six) hours as needed for moderate pain or severe pain.    Historical Provider, MD  hydrOXYzine (ATARAX/VISTARIL) 10 MG tablet TAKE 1 TABLET BY MOUTH THREE TIMES DAILY AS NEEDED FOR ITCHING 04/08/14   Biagio Borg, MD  metFORMIN (GLUCOPHAGE-XR) 500 MG 24 hr tablet Take 4 tablets (2,000 mg total) by mouth daily with breakfast. 02/28/14   Isaiah Serge, NP  metFORMIN (GLUCOPHAGE-XR) 500 MG 24 hr tablet TAKE 2 TABLETS BY MOUTH EVERY MORNING 08/27/15   Biagio Borg, MD  nitroGLYCERIN (NITROSTAT) 0.4 MG SL tablet Place 0.4 mg under the tongue every 5 (five) minutes as needed for chest pain.    Historical Provider, MD  rosuvastatin (CRESTOR) 10 MG tablet Take 1 tablet (10 mg total) by mouth daily.  Not taking 03/27/15   Biagio Borg, MD  SYNTHROID 150 MCG tablet TAKE 1 TABLET BY MOUTH EVERY DAY 03/16/15   Biagio Borg, MD  tiZANidine (ZANAFLEX) 4 MG tablet Take 1 tablet (4 mg total) by mouth every 6 (six) hours as needed for muscle spasms. 06/30/15   Lyndal Pulley, DO  traMADol (ULTRAM) 50 MG tablet Take 1 tablet (50 mg total) by mouth every 6 (six) hours as needed. 07/29/14   Cassandria Anger, MD  Vitamin D, Ergocalciferol, (DRISDOL) 50000 units CAPS capsule Take 1 capsule (50,000 Units total) by mouth every 7 (seven) days. 10/06/15   Lyndal Pulley, DO    Inpatient Medications        Family History    Family History  Problem Relation Age of Onset  . Heart attack Mother 79    s/p D&C-CARDIAC ARREST 1966  . Heart disease Mother   . Heart attack Father 41    1978 WITH MI  . Heart disease Father   . Diabetes Brother   . Diabetes Maternal Aunt   . Arthritis Maternal Aunt   . Cancer Maternal Aunt   . Colon cancer  Neg Hx     Social History    Social History   Social History  . Marital status: Divorced    Spouse name: N/A  . Number of children: 3  . Years of education: N/A   Occupational History  . [employer]    ADMIN SUPPORT  .  Retired  Social History Main Topics  . Smoking status: Former Smoker    Years: 30.00    Types: Cigarettes    Quit date: 05/31/1986  . Smokeless tobacco: Never Used  . Alcohol use No  . Drug use: No  . Sexual activity: Not on file   Other Topics Concern  . Not on file   Social History Narrative   DIVORCED   3 CHILDREN   PATIENT SIGNED A DESIGNATED PARTY RELEASE TO ALLOW HER DAUGHTER, TRAMAINE Prouse, TO HAVE ACCESS TO HER MEDICAL RECORDS/INFORMATION. [name], May 04, 2009 @ 3:27 PM   Smokes cigarettes on rare occasions.     Review of Systems    General:  No chills, fever, night sweats or weight changes.  Cardiovascular:  + chest pain, dyspnea on exertion, edema, orthopnea, palpitations, paroxysmal nocturnal dyspnea. Dermatological: No rash, lesions/masses Respiratory: No cough, dyspnea Urologic: No hematuria, dysuria Abdominal:   No nausea, vomiting, diarrhea, bright red blood per rectum, melena, or hematemesis Neurologic:  No visual changes, wkns, changes in mental status. All other systems reviewed and are otherwise negative except as noted above.  Physical Exam    Blood pressure 91/64, pulse 67, temperature 97.9 F (36.6 C), resp. rate 15, height 5\' 6"  (1.676 m), weight 220 lb 14.4 oz (100.2 kg), SpO2 100 %.  General: Pleasant, NAD Psych: Normal affect. Neuro: Alert and oriented X 3. Moves all extremities spontaneously. HEENT: Normal  Neck: Supple without bruits or JVD. Lungs:  Resp regular and unlabored, CTA. Heart: RRR no s3, s4, or murmurs. Abdomen: Soft, non-tender, non-distended, BS + x 4.  Extremities: No clubbing, cyanosis or edema. DP/PT/Radials 2+ and equal bilaterally.  Labs    Troponin [facility]  Test)  Recent Labs  10/29/15 0709  TROPIPOC 0.01    Recent Labs  10/29/15 0700  TROPONINI <0.03   Lab Results  Component Value Date   WBC 10.6 (H) 10/29/2015   HGB 13.8 10/29/2015   HCT 42.0 10/29/2015   MCV 87.3 10/29/2015   PLT 282 10/29/2015    Recent Labs Lab 10/29/15 0708  NA 139  K 3.7  CL 103  CO2 24  BUN 8  CREATININE 0.90  CALCIUM 8.4*  GLUCOSE 146*   Lab Results  Component Value Date   HGBA1C 6.6 (H) 03/27/2015     Radiology Studies    Dg Chest 2 View  Result Date: 10/29/2015 CLINICAL DATA:  Chest pain. EXAM: CHEST  2 VIEW COMPARISON:  Radiographs of April 06, 2014. FINDINGS: The heart size and mediastinal contours are within normal limits. Atherosclerosis of thoracic aorta is noted. No pneumothorax or pleural effusion is noted. Multilevel degenerative disc disease is noted in thoracic spine. Right lung is clear. Stable minimal left basilar scarring is noted. IMPRESSION: No active cardiopulmonary disease.  Aortic atherosclerosis. Electronically Signed   By: Marijo Conception, M.D.   On: 10/29/2015 07:43    EKG & Cardiac Imaging    EKG: NSR, nonspecific ST abnormalities in anterior leads  Echocardiogram: pending  Assessment & Plan   1. Unstable angina: Presents with chest pain that she describes as her anginal equivalent. Will order stat echo to assess for wall motion abnormalities, troponin is negative x 2, last cath was in 02/2014 and she had mild non obstructive CAD with widely patent stents in her LAD, and diffuse narrowing in her proximal RCA .  Start heparin gtt, and will start Nitro gtt if BP allows.   2. History of CAD: On atenolol,  Crestor. Will continue same   3. HLD: LDL is 103, consider increasing Crestor, she has DM.  *see below  4. DM: On oral anti-hyperglycemics. Will order SSI.    Signed, Arbutus Leas, NP 10/29/2015, 9:45 AM Pager: [phone]  History and all data above reviewed.  Patient examined.  I agree with the  findings as above.   The patient presents with chest pain starting this AM that is similar to her previous [place].  She currently has some mild discomfort but is much improved.  Echo EF is normal.  EKG shows no acute changes.   No significant findings.  Initial enzymes are normal.    The patient exam reveals COR:RRR  ,  Lungs: Clear  ,  Abd: Positive bowel sounds, no rebound no guarding, Ext No edema  .  All available labs, radiology testing, previous records reviewed. Agree with documented assessment and plan. Unstable angina:  High risk based on symptoms and history for large vessel obstructive CAD.  Cardiac cath is indicated.  The patient understands that risks included but are not limited to stroke (1 in 1000), death (1 in 4), kidney failure [usually temporary] (1 in 500), bleeding (1 in 200), allergic reaction [possibly serious] (1 in 200).  The patient understands and agrees to proceed.   Of note:  She has not been taking her Crestor and needs to start this or Lipitor.  We need to work with her to help her find an affordable statin.   Jeneen Rinks Yaresly Menzel  12:16 PM  10/29/2015

## 2015-10-29 NOTE — ED Notes (Signed)
Attempted to call report

## 2015-10-29 NOTE — Progress Notes (Signed)
Post-Catheterization Note  Patient evaluated in CCU following catheterization. She is now chest pain-free. Her only complaint is of a headache, likely due to ongoing nitroglycerin infusion. We will titrate off nitroglycerin infusion over the coming hours. We will plan to monitor the patient overnight in 2-Heart and plan for transfer to telemetry or discharge home tomorrow, based on her overnight course.  Nelva Bush, MD [facility] Pager: [phone]

## 2015-10-29 NOTE — ED Triage Notes (Signed)
Pt to ED by [facility] c/o centralized chest pain onset this morning after urinating. Hx of NSTEMI with stents x 3. Pain is centralized, radiating into both arms, and having mild sob. Pt took two of her own nitro at home. EMS gave an additional 3 nitros and 324mg  ASA. Reports mild improvement with nitro

## 2015-10-29 NOTE — Progress Notes (Signed)
ANTICOAGULATION CONSULT NOTE - Initial Consult  Pharmacy Consult for heparin Indication: chest pain/ACS  Allergies  Allergen Reactions  . Prilosec [Omeprazole] Other (See Comments)    Chest pain  . Miconazole Nitrate Hives    REACTION: hives  . Prednisone Swelling    Tongue/lip swelling.  . Augmentin [Amoxicillin-Pot Clavulanate] Hives, Itching and Rash  . Doxycycline Other (See Comments)    REACTION: gi upset    Patient Measurements: Height: 5\' 6"  (167.6 cm) Weight: 220 lb 14.4 oz (100.2 kg) IBW/kg (Calculated) : 59.3 Heparin Dosing Weight: 81kg   Vital Signs: Temp: 97.9 F (36.6 C) (10/12 0711) BP: 83/63 (10/12 0745) Pulse Rate: 71 (10/12 0745)  Labs:  Recent Labs  10/29/15 0708  HGB 13.8  HCT 42.0  PLT 282  CREATININE 0.90    Estimated Creatinine Clearance: 69.5 mL/min (by C-G formula based on SCr of 0.9 mg/dL).   Medical History: Past Medical History:  Diagnosis Date  . AAA (abdominal aortic aneurysm) ([place])    a. 4.7cm in 08/2012. Followed by [facility].  . Allergic rhinitis, cause unspecified   . Anxiety state, unspecified   . Chronic LBP   . Coronary artery disease    a. s/p inferior MI 1998->PCI of RCA. b. S/p prior PCI of the OM1 and LAD;  c. 05/2013 Cath/PCI: LM nl, LAD 90p (3.0x22 Resolute DES), 48m w/ patent stent, Diag 40p, 48m, LCX patent OM1 stent, RCA 50p, 47m, EF 65%.  . Degeneration of lumbar or lumbosacral intervertebral disc   . Depressive disorder, not elsewhere classified   . Diabetes mellitus    TYPE II  . Gout 08/22/2013  . Hyperlipidemia   . Hypertension   . Hypothyroidism   . Lower GI bleed 06/2010   Diverticular bleed  . Noncompliance with medications 02/26/2014  . Obesity, unspecified     Medications:  Infusions:  . heparin      Assessment: 42 yof presented to the ED with CP. Baseline H/H and platelets are WNL. She is not on anticoagulation PTA. Initial troponin is 0.01.   Goal of Therapy:  Heparin level 0.3-0.7  units/ml Monitor platelets by anticoagulation protocol: Yes   Plan:  - Heparin bolus 4000 units IV x 1 - Heparin gtt 1100 units/hr - Check an 8 hr heparin level - Daily heparin level and CBC  Alan Drummer, Rande Lawman 10/29/2015,8:12 AM

## 2015-10-29 NOTE — ED Notes (Signed)
Cards PA at bedside. 

## 2015-10-29 NOTE — ED Notes (Signed)
Pt Bp 83/63; MD made aware; verbal order received for 571mL NS bolus.

## 2015-10-29 NOTE — Interval H&P Note (Signed)
History and Physical Interval Note:  10/29/2015 2:41 PM  [name]  has presented today for cardiac catheterization, with the diagnosis of unstable angina. The various methods of treatment have been discussed with the patient and family. After consideration of risks, benefits and other options for treatment, the patient has consented to  Procedure(s): Left Heart Cath and Coronary Angiography (N/A) as a surgical intervention .  The patient's history has been reviewed, patient examined, no change in status, stable for surgery.  I have reviewed the patient's chart and labs.  Questions were answered to the patient's satisfaction. The patient last ate 2 hours ago.  However, given refractory chest pain, she has been referred for urgent catheterization.   Cath Lab Visit (complete for each Cath Lab visit)  Clinical Evaluation Leading to the Procedure:   ACS: Yes.    Non-ACS:    Anginal Classification: CCS IV  Anti-ischemic medical therapy: Minimal Therapy (1 class of medications)  Non-Invasive Test Results: No non-invasive testing performed  Prior CABG: No previous CABG   [name]

## 2015-10-29 NOTE — ED Provider Notes (Addendum)
[facility] DEPT Provider Note   CSN: [id number] Arrival date & time: 10/29/15  [id number]     History   Chief Complaint Chief Complaint  Patient presents with  . Chest Pain    HPI Jamaia TREONA UENO is a 70 y.o. female.  HPI Patient presents with chest pain. States it woke her up this morning. It is pressure in her mid chest and goes to both arms, worse on the left. States it feels like her previous heart attacks. She had 3 previous stents. Sees Dr. Burt Knack. Had total of 5 nitroglycerin between home and EMS. Also had 324 mg of aspirin. Chest pain is improved somewhat. She is however somewhat hypotensive now. States she was sweaty at home. No nausea. No real shortness of breath. No swelling or legs.   Past Medical History:  Diagnosis Date  . AAA (abdominal aortic aneurysm) ([place])    a. 4.7cm in 08/2012. Followed by [facility].  . Allergic rhinitis, cause unspecified   . Anxiety state, unspecified   . Chronic LBP   . Coronary artery disease    a. s/p inferior MI 1998->PCI of RCA. b. S/p prior PCI of the OM1 and LAD;  c. 05/2013 Cath/PCI: LM nl, LAD 90p (3.0x22 Resolute DES), 56m w/ patent stent, Diag 40p, 65m, LCX patent OM1 stent, RCA 50p, 48m, EF 65%.  . Degeneration of lumbar or lumbosacral intervertebral disc   . Depressive disorder, not elsewhere classified   . Diabetes mellitus    TYPE II  . Gout 08/22/2013  . Hyperlipidemia   . Hypertension   . Hypothyroidism   . Lower GI bleed 06/2010   Diverticular bleed  . Noncompliance with medications 02/26/2014  . Obesity, unspecified     Patient Active Problem List   Diagnosis Date Noted  . Lumbar radiculopathy 05/15/2015  . Acute sinus infection 05/14/2015  . Arthritis of left hip 04/16/2015  . Right otitis media 03/23/2015  . Left groin pain 03/23/2015  . Acute gout 01/16/2015  . Right knee pain 07/15/2014  . Acute upper respiratory infection 06/09/2014  . Angioedema of lips 06/03/2014  . Frequent urination 05/08/2014  . Acute  epiglottitis without obstruction 05/05/2014  . Angioedema   . Neck swelling   . Abdominal tenderness 03/26/2014  . Noncompliance with medications 02/26/2014  . AAA (abdominal aortic aneurysm) ([place])   . Chest pain, stable coronary arteries at cath, non cardiac pain, resolved 02/24/2014  . Jaw pain 09/25/2013  . Myalgia 09/25/2013  . Gout 08/22/2013  . Abdominal pain, unspecified site 08/13/2013  . Musculoskeletal pain 08/04/2013  . Aftercare following surgery of the circulatory system, [place] 06/25/2013  . Unstable angina pectoris ([place]) 06/06/2013  . Peripheral vascular disease, unspecified 06/19/2012  . Dry cough 03/05/2012  . Dysuria 04/21/2011  . Low back pain 10/04/2010  . Acute ischemic colitis ([place]) 07/29/2010  . Lower GI bleed 07/23/2010  . Liver lesion 07/23/2010  . Preventative health care 07/22/2010  . URTICARIA 09/23/2009  . TOBACCO ABUSE 11/18/2008  . Obesity 11/17/2008  . MENOPAUSAL DISORDER 10/29/2007  . ANXIETY 06/22/2007  . DEPRESSION 06/22/2007  . OTITIS MEDIA, SEROUS, CHRONIC 04/23/2007  . CONSTIPATION 02/07/2007  . [place] DISEASE, LUMBOSACRAL SPINE 02/07/2007  . Diabetes ([place]) 11/03/2006  . Allergic rhinitis 11/03/2006  . HYPOTHYROIDISM 07/31/2006  . Hyperlipidemia 07/31/2006  . Essential hypertension 07/31/2006  . Coronary atherosclerosis 07/31/2006    Past Surgical History:  Procedure Laterality Date  . CARDIAC CATHETERIZATION     PCI OF BOTH THE CIRCUMFLEX AND LEFT  ANTERIOR DESCENDING ARTERY  . CESAREAN SECTION    . HEART STENT  04-2010  and  Jun 07, 2013   X 3  . LEFT HEART CATHETERIZATION WITH CORONARY ANGIOGRAM N/A 06/07/2013   Procedure: LEFT HEART CATHETERIZATION WITH CORONARY ANGIOGRAM;  Surgeon: [name], MD;  Location: [facility] CATH LAB;  Service: Cardiovascular;  Laterality: N/A;  . LEFT HEART CATHETERIZATION WITH CORONARY ANGIOGRAM N/A 02/25/2014   Procedure: LEFT HEART CATHETERIZATION WITH CORONARY ANGIOGRAM;  Surgeon: Troy Sine,  MD;  Location: [facility] CATH LAB;  Service: Cardiovascular;  Laterality: N/A;  . LUMBAR FUSION  01/2007   DR. Patrice Paradise...3-LEVEL WITH FIXATION  . PARATHYROIDECTOMY    . SPINE SURGERY    . THYROIDECTOMY    . TOTAL ABDOMINAL HYSTERECTOMY      OB History    No data available       Home Medications    Prior to Admission medications   Medication Sig Start Date End Date Taking? Authorizing Provider  acetaminophen (TYLENOL) 325 MG tablet Take 2 tablets (650 mg total) by mouth every 4 (four) hours as needed for headache or mild pain. 02/26/14   Isaiah Serge, NP  Acetaminophen-Codeine (TYLENOL/CODEINE #3) 300-30 MG tablet Take 1 tablet by mouth every 6 (six) hours as needed for pain. 05/14/15   Biagio Borg, MD  allopurinol (ZYLOPRIM) 300 MG tablet Take 1 tablet (300 mg total) by mouth daily. 09/01/15   Lyndal Pulley, DO  aspirin 81 MG EC tablet Take 81 mg by mouth. 4 days a week    Historical Provider, MD  atenolol (TENORMIN) 25 MG tablet Take 1 tablet (25 mg total) by mouth 2 (two) times daily. 07/14/14   Biagio Borg, MD  colchicine 0.6 MG tablet Take 1 per hour for acute gout attack until improved, or until diarrhea 01/23/15   Biagio Borg, MD  gabapentin (NEURONTIN) 300 MG capsule nightly 09/01/15   Lyndal Pulley, DO  HYDROcodone-acetaminophen (NORCO) 10-325 MG tablet Take 1 tablet by mouth every 6 (six) hours as needed for moderate pain or severe pain.    Historical Provider, MD  hydrOXYzine (ATARAX/VISTARIL) 10 MG tablet TAKE 1 TABLET BY MOUTH THREE TIMES DAILY AS NEEDED FOR ITCHING 04/08/14   Biagio Borg, MD  metFORMIN (GLUCOPHAGE-XR) 500 MG 24 hr tablet Take 4 tablets (2,000 mg total) by mouth daily with breakfast. 02/28/14   Isaiah Serge, NP  metFORMIN (GLUCOPHAGE-XR) 500 MG 24 hr tablet TAKE 4 TABLETS BY MOUTH EVERY MORNING 08/27/15   Biagio Borg, MD  nitroGLYCERIN (NITROSTAT) 0.4 MG SL tablet Place 0.4 mg under the tongue every 5 (five) minutes as needed for chest pain.    Historical Provider,  MD  predniSONE (DELTASONE) 10 MG tablet 3 tabs by mouth per day for 3 days,2tabs per day for 3 days,1 tab per day for 3 days Patient not taking: Reported on 10/06/2015 10/01/15   Biagio Borg, MD  rosuvastatin (CRESTOR) 10 MG tablet Take 1 tablet (10 mg total) by mouth daily. 03/27/15   Biagio Borg, MD  SYNTHROID 150 MCG tablet TAKE 1 TABLET BY MOUTH EVERY DAY 03/16/15   Biagio Borg, MD  tiZANidine (ZANAFLEX) 4 MG tablet Take 1 tablet (4 mg total) by mouth every 6 (six) hours as needed for muscle spasms. 06/30/15   Lyndal Pulley, DO  traMADol (ULTRAM) 50 MG tablet Take 1 tablet (50 mg total) by mouth every 6 (six) hours as needed. 07/29/14  Cassandria Anger, MD  Vitamin D, Ergocalciferol, (DRISDOL) 50000 units CAPS capsule Take 1 capsule (50,000 Units total) by mouth every 7 (seven) days. 10/06/15   Lyndal Pulley, DO    Family History Family History  Problem Relation Age of Onset  . Heart attack Mother 62    s/p D&C-CARDIAC ARREST 1966  . Heart disease Mother   . Heart attack Father 75    1978 WITH MI  . Heart disease Father   . Diabetes Brother   . Diabetes Maternal Aunt   . Arthritis Maternal Aunt   . Cancer Maternal Aunt   . Colon cancer Neg Hx     Social History Social History  Substance Use Topics  . Smoking status: Former Smoker    Years: 30.00    Types: Cigarettes    Quit date: 05/31/1986  . Smokeless tobacco: Never Used  . Alcohol use No     Allergies   Prilosec [omeprazole]; Miconazole nitrate; Prednisone; Augmentin [amoxicillin-pot clavulanate]; and Doxycycline   Review of Systems Review of Systems  Constitutional: Positive for diaphoresis. Negative for activity change and appetite change.  Eyes: Negative for pain.  Respiratory: Negative for chest tightness and shortness of breath.   Cardiovascular: Positive for chest pain. Negative for leg swelling.  Gastrointestinal: Negative for abdominal pain, diarrhea, nausea and vomiting.  Genitourinary: Negative for  flank pain.  Musculoskeletal: Negative for back pain and neck stiffness.  Skin: Negative for rash.  Neurological: Negative for weakness, numbness and headaches.  Psychiatric/Behavioral: Negative for behavioral problems.     Physical Exam Updated Vital Signs BP 91/64   Pulse 67   Temp 97.9 F (36.6 C)   Resp 15   Ht 5\' 6"  (1.676 m)   Wt 220 lb 14.4 oz (100.2 kg)   SpO2 100%   BMI 35.65 kg/m   Physical Exam  Constitutional: She appears well-developed.  HENT:  Head: Atraumatic.  Neck: Neck supple. No JVD present.  Cardiovascular: Normal rate.   Pulmonary/Chest: Effort normal.  Abdominal: Soft.  Musculoskeletal: She exhibits no edema.  Neurological: She is alert.  Skin: Skin is warm.  Psychiatric: She has a normal mood and affect.     ED Treatments / Results  Labs (all labs ordered are listed, but only abnormal results are displayed) Labs Reviewed  BASIC METABOLIC PANEL - Abnormal; Notable for the following:       Result Value   Glucose, Bld 146 (*)    Calcium 8.4 (*)    All other components within normal limits  CBC - Abnormal; Notable for the following:    WBC 10.6 (*)    All other components within normal limits  TROPONIN I  HEPARIN LEVEL (UNFRACTIONATED)  TROPONIN I  TROPONIN I  I-STAT TROPOININ, ED    EKG  EKG Interpretation  Date/Time:  Thursday October 29 2015 08:01:41 EDT Ventricular Rate:  71 PR Interval:    QRS Duration: 104 QT Interval:  419 QTC Calculation: 456 R Axis:   -67 Text Interpretation:  Sinus rhythm Left anterior fascicular block Low voltage, precordial leads Consider anterior infarct stable from earlier today Confirmed by Alvino Chapel  MD, Marrie Chandra [phone]) on 10/29/2015 8:44:29 AM       Radiology Dg Chest 2 View  Result Date: 10/29/2015 CLINICAL DATA:  Chest pain. EXAM: CHEST  2 VIEW COMPARISON:  Radiographs of April 06, 2014. FINDINGS: The heart size and mediastinal contours are within normal limits. Atherosclerosis of thoracic  aorta is noted. No pneumothorax or pleural  effusion is noted. Multilevel degenerative disc disease is noted in thoracic spine. Right lung is clear. Stable minimal left basilar scarring is noted. IMPRESSION: No active cardiopulmonary disease.  Aortic atherosclerosis. Electronically Signed   By: Marijo Conception, M.D.   On: 10/29/2015 07:43    Procedures Procedures (including critical care time)  Medications Ordered in ED Medications  heparin ADULT infusion 100 units/mL (25000 units/280mL sodium chloride 0.45%) (1,100 Units/hr Intravenous New Bag/Given 10/29/15 0824)  sodium chloride 0.9 % bolus 500 mL (500 mLs Intravenous New Bag/Given 10/29/15 0758)  heparin bolus via infusion 4,000 Units (4,000 Units Intravenous Bolus from Bag 10/29/15 0829)     Initial Impression / Assessment and Plan / ED Course  I have reviewed the triage vital signs and the nursing notes.  Pertinent labs & imaging results that were available during my care of the patient were reviewed by me and considered in my medical decision making (see chart for details).  Clinical Course    Patient with chest pain. Worrisome story for unstable angina although she does have continued pain. Nitroglycerin dropped her pressure. Fluid bolus given. Initial troponin negative. Repeat EKG stable. Heparin drip started. Admit to cardiology.  CRITICAL CARE Performed by: Mackie Pai Total critical care time: 30 minutes Critical care time was exclusive of separately billable procedures and treating other patients. Critical care was necessary to treat or prevent imminent or life-threatening deterioration. Critical care was time spent personally by me on the following activities: development of treatment plan with patient and/or surrogate as well as nursing, discussions with consultants, evaluation of patient's response to treatment, examination of patient, obtaining history from patient or surrogate, ordering and performing treatments  and interventions, ordering and review of laboratory studies, ordering and review of radiographic studies, pulse oximetry and re-evaluation of patient's condition.    Final Clinical Impressions(s) / ED Diagnoses   Final diagnoses:  Unstable angina [facility])    New Prescriptions New Prescriptions   No medications on file     Davonna Belling, MD 10/29/15 [id number]    Davonna Belling, MD 10/29/15 0930

## 2015-10-29 NOTE — ED Notes (Signed)
MD at bedside. 

## 2015-10-30 ENCOUNTER — Encounter (HOSPITAL_COMMUNITY): Payer: Self-pay | Admitting: Internal Medicine

## 2015-10-30 LAB — GLUCOSE, CAPILLARY
GLUCOSE-CAPILLARY: 146 mg/dL — AB (ref 65–99)
GLUCOSE-CAPILLARY: 155 mg/dL — AB (ref 65–99)
Glucose-Capillary: 204 mg/dL — ABNORMAL HIGH (ref 65–99)
Glucose-Capillary: 176 mg/dL — ABNORMAL HIGH (ref 65–99)

## 2015-10-30 LAB — LIPID PANEL
CHOL/HDL RATIO: 5.2 ratio
Cholesterol: 173 mg/dL (ref 0–200)
HDL: 33 mg/dL — AB (ref >40)
LDL CALC: 115 mg/dL — AB (ref 0–99)
TRIGLYCERIDES: 125 mg/dL (ref <150)
VLDL: 25 mg/dL (ref 0–40)

## 2015-10-30 LAB — CBC
HEMATOCRIT: 39.8 % (ref 36.0–46.0)
HEMOGLOBIN: 13 g/dL (ref 12.0–15.0)
MCH: 28.3 pg (ref 26.0–34.0)
MCHC: 32.7 g/dL (ref 30.0–36.0)
MCV: 86.7 fL (ref 78.0–100.0)
Platelets: 274 10*3/uL (ref 150–400)
RBC: 4.59 MIL/uL (ref 3.87–5.11)
RDW: 14.6 % (ref 11.5–15.5)
WBC: 9.5 10*3/uL (ref 4.0–10.5)

## 2015-10-30 LAB — BASIC METABOLIC PANEL
ANION GAP: 9 (ref 5–15)
BUN: <5 mg/dL — ABNORMAL LOW (ref 6–20)
CHLORIDE: 110 mmol/L (ref 101–111)
CO2: 24 mmol/L (ref 22–32)
CREATININE: 0.77 mg/dL (ref 0.44–1.00)
Calcium: 8.2 mg/dL — ABNORMAL LOW (ref 8.9–10.3)
GFR calc non Af Amer: >60 mL/min (ref >60)
Glucose, Bld: 98 mg/dL (ref 65–99)
POTASSIUM: 3.4 mmol/L — AB (ref 3.5–5.1)
Sodium: 143 mmol/L (ref 135–145)

## 2015-10-30 LAB — PROTIME-INR
INR: 1.03
Prothrombin Time: 13.5 seconds (ref 11.4–15.2)

## 2015-10-30 MED ORDER — ASPIRIN 81 MG PO CHEW: 81.0000 mg | CHEWABLE_TABLET | ORAL | Status: DC

## 2015-10-30 MED ORDER — POTASSIUM CHLORIDE CRYS ER 20 MEQ PO TBCR
40.0000 meq | EXTENDED_RELEASE_TABLET | Freq: Once | ORAL | Status: AC
Start: 2015-10-30 — End: 2015-10-30
  Administered 2015-10-30: 40 meq via ORAL
  Filled 2015-10-30: qty 2

## 2015-10-30 MED ORDER — SODIUM CHLORIDE 0.9 % IV SOLN: INTRAVENOUS | Status: DC

## 2015-10-30 MED ORDER — INSULIN ASPART 100 UNIT/ML ~~LOC~~ SOLN
0.0000 [IU] | Freq: Three times a day (TID) | SUBCUTANEOUS | Status: DC
  Administered 2015-10-30: 2 [IU] via SUBCUTANEOUS
  Administered 2015-10-30: 3 [IU] via SUBCUTANEOUS
  Administered 2015-10-31 (×2): 2 [IU] via SUBCUTANEOUS
  Administered 2015-10-31: 5 [IU] via SUBCUTANEOUS

## 2015-10-30 MED FILL — Adenosine IV Soln 12 MG/4ML: INTRAVENOUS | Qty: 16 | Status: CN

## 2015-10-30 NOTE — Care Management Note (Addendum)
Case Management Note  Patient Details  Name: [name] MRN: [id number] Date of Birth: [birth year]  Subjective/Objective:  Pt presented for Chest Pain.  Post cardiac cath with stenting to the RCA. Plan will be to d/c on Brilinta.                   Action/Plan: Benefits check to be provided to patient once completed.  CM did provide pt with the 30 day free card. Pt uses [facility] on [address] and medication is available. No further needs at this time.  Expected Discharge Date:                  Expected Discharge Plan:  Home/Self Care  In-House Referral:  NA  Discharge planning Services  CM Consult, Medication Assistance  Post Acute Care Choice:  NA Choice offered to:  NA  DME Arranged:  N/A DME Agency:  NA  HH Arranged:  NA HH Agency:  NA  Status of Service:  Completed, signed off  If discussed at [place] of Stay Meetings, dates discussed:    Additional Comments: Pt copay wil be $47-prior auth not required.  [place] 10-30-15 Jacqlyn Krauss, RN,BSN Case Manager Bethena Roys, RN 10/30/2015, 2:23 PM

## 2015-10-30 NOTE — Progress Notes (Signed)
CARDIAC REHAB PHASE I   PRE:  Rate/Rhythm: 32 SR  BP:  Supine: 117/64  Sitting:   Standing:    SaO2: 94%RA  MODE:  Ambulation: 290 ft   POST:  Rate/Rhythm: 106 ST  BP:  Supine:   Sitting: 129/82  Standing:    SaO2: 97%RA 1025-1127 Pt walked 290 ft on RA with slow pace holding to hall rail at times.. No CP. Exhausted by end of walk. To recliner for ed and then back to bed so she could sleep. Reviewed importance of brilinta and gave her brilinta card (she stated she was on it with last stent). Reviewed NTG use, watching carbs and heart healthy diet, modified ex ed due to back issues and CRP 2. Stated has done CRP 2 before in [place]. Referring to [place] program.   Graylon Good, RN BSN  10/30/2015 11:23 AM

## 2015-10-30 NOTE — Progress Notes (Signed)
Report called to RN.

## 2015-10-30 NOTE — Progress Notes (Signed)
Patient Name: [name] Date of Encounter: 10/30/2015  Hospital Problem List     Active Problems:   Unstable angina [facility])    Patient Profile     [name] is a 70 year old female with a past medical history of CAD s/p inferior MI in 1998 with PCI to RCA, prior PCI of the OM1 and LAD, 2015 DES to LAD, DM, HTN, and HLD. She presented with chest pain.    Subjective   No chest pain.  No SOB. She has not ambulated yet.   Inpatient Medications    . allopurinol  300 mg Oral Daily  . [START ON 10/31/2015] aspirin  81 mg Oral Pre-Cath  . aspirin EC  81 mg Oral Daily  . atenolol  25 mg Oral Daily  . enoxaparin (LOVENOX) injection  40 mg Subcutaneous Q24H  . gabapentin  300 mg Oral QHS  . Influenza vac split quadrivalent PF  0.5 mL Intramuscular Tomorrow-1000  . levothyroxine  150 mcg Oral QAC breakfast  . rosuvastatin  10 mg Oral Daily  . sodium chloride flush  3 mL Intravenous Q12H  . ticagrelor  90 mg Oral BID    Vital Signs    Vitals:   10/30/15 0600 10/30/15 0630 10/30/15 0700 10/30/15 0800  BP: 134/71 116/73 (!) 126/107 117/79  Pulse: 66 66 89 62  Resp: '15 16 15 15  ' Temp:      TempSrc:      SpO2: 95% 95% 95% 97%  Weight:      Height:        Intake/Output Summary (Last 24 hours) at 10/30/15 0849 Last data filed at 10/29/15 2345  Gross per 24 hour  Intake             1055 ml  Output              925 ml  Net              130 ml   Filed Weights   10/29/15 0800 10/29/15 1254 10/29/15 1800  Weight: 220 lb 14.4 oz (100.2 kg) 223 lb 4.8 oz (101.3 kg) 235 lb 14.3 oz (107 kg)    Physical Exam    GEN: Well nourished, well developed, in no acute distress.  Neck: Supple, no JVD, carotid bruits, or masses. Cardiac: RRR, no rubs, or gallops. No clubbing, cyanosis, no edema.  Radials/DP/PT 2+ and equal bilaterally, right radial without bleeding but with slight echymosis and tenderness.   Respiratory:  Respirations  regular and unlabored, clear to auscultation  bilaterally. GI: Soft, nontender, nondistended, BS + x 4. Neuro:  Strength and sensation are intact.   Labs    CBC  Recent Labs  10/29/15 0708 10/30/15 0255  WBC 10.6* 9.5  HGB 13.8 13.0  HCT 42.0 39.8  MCV 87.3 86.7  PLT 282 703   Basic Metabolic Panel  Recent Labs  10/29/15 0708 10/30/15 0255  NA 139 143  K 3.7 3.4*  CL 103 110  CO2 24 24  GLUCOSE 146* 98  BUN 8 <5*  CREATININE 0.90 0.77  CALCIUM 8.4* 8.2*   Liver Function Tests No results for input(s): AST, ALT, ALKPHOS, BILITOT, PROT, ALBUMIN in the last 72 hours. No results for input(s): LIPASE, AMYLASE in the last 72 hours. Cardiac Enzymes  Recent Labs  10/29/15 0700 10/29/15 1415 10/29/15 1740  TROPONINI <0.03 <0.03 0.03*   BNP Invalid input(s): POCBNP D-Dimer No results for input(s): DDIMER in the last 72  hours. Hemoglobin A1C No results for input(s): HGBA1C in the last 72 hours. Fasting Lipid Panel  Recent Labs  10/30/15 0255  CHOL 173  HDL 33*  LDLCALC 115*  TRIG 125  CHOLHDL 5.2   Thyroid Function Tests No results for input(s): TSH, T4TOTAL, T3FREE, THYROIDAB in the last 72 hours.  Invalid input(s): FREET3  Telemetry    NSR  ECG    NSR, rate 76, LAD, LAFB, no acute ST T wave changes.   CATH    Dominance: Right  Left Main  Vessel is large.  Left Anterior Descending  Ost LAD to Mid LAD lesion, 30% stenosed. The lesion is irregular. The lesion was previously treatedover 2 years ago. Previously placed stent displays restenosis.  Dist LAD lesion, 70% stenosed.  First Diagonal Branch  Vessel is small in size.  Second Diagonal Branch  Vessel is moderate in size.  2nd Diag lesion, 70% stenosed.  Third Diagonal Branch  Vessel is small in size.  Left Circumflex  Vessel is large.  Mid Cx lesion, 60% stenosed.  First Obtuse Marginal Branch  Vessel is small in size.  Second Obtuse Marginal Branch  Vessel is large in size.  Ost 2nd Mrg to 2nd Mrg lesion, 0% stenosed.  Previously placed Ost 2nd Mrg to 2nd Mrg bare metal stent is widely patent.  Third Obtuse Marginal Branch  Vessel is moderate in size.  Right Coronary Artery  Vessel is large.  Ost RCA lesion, 60% stenosed.  Angioplasty: Lesion crossed with guidewire using a GUIDEWIRE COMET PRESSURE. Pre-stent angioplasty was performed using a BALLOON [place] MR 3.0X12. A STENT PROMUS PREM MR 3.0X12 drug eluting stent was successfully placed, and overlaps previously placed stent. Stent strut is well apposed. Post-stent angioplasty was performed using a BALLOON [place] EUPHORA [id number]. Maximum pressure: 16 atm. The pre-interventional distal flow is normal (TIMI 3). The post-interventional distal flow is normal (TIMI 3). The intervention was successful . No complications occurred at this lesion.  There is no residual stenosis post intervention.  Ost RCA to Prox RCA lesion, 50% stenosed.  Angioplasty: Lesion crossed with guidewire using a GUIDEWIRE COMET PRESSURE. Pre-stent angioplasty was performed using a BALLOON [place] MR 3.0X12. A STENT PROMUS PREM MR 3.0X16 drug eluting stent was successfully placed, and overlaps previously placed stent. Stent strut is well apposed. Post-stent angioplasty was performed using a BALLOON [place] EUPHORA [id number]. Maximum pressure: 14 atm. The pre-interventional distal flow is normal (TIMI 3). The post-interventional distal flow is normal (TIMI 3). The intervention was successful . No complications occurred at this lesion.  There is no residual stenosis post intervention.  Prox RCA to Mid RCA lesion, 70% stenosed. Pressure wire/FFR was performed on the lesion. FFR: 0.71.  Angioplasty: Lesion crossed with guidewire using a GUIDEWIRE COMET PRESSURE. Pre-stent angioplasty was performed. A STENT PROMUS PREM MR 3.0X28 drug eluting stent was successfully placed. Stent strut is well apposed. Post-stent angioplasty was performed using a BALLOON [place] EUPHORA [id number]. Maximum pressure: 14 atm. The  pre-interventional distal flow is normal (TIMI 3). The post-interventional distal flow is normal (TIMI 3). The intervention was successful .  There is no residual stenosis post intervention.  Mid RCA to Dist RCA lesion, 50% stenosed.  Right Posterior Descending Artery  Vessel is moderate in size.  RPDA lesion, 40% stenosed.  Right Posterior Atrioventricular Branch  Vessel is small in size.  Coronary Diagrams   Diagnostic Diagram     Post-Intervention Diagram        Assessment & Plan  UNSTABLE ANGINA:   Extensive stenting of the RCA.  Residual disease to manage medically.   AAA:  Follow with vascular surgery.   DM:  Continue metformin at discharge.  Note she was only taking this bid.  Lab Results  Component Value Date   HGBA1C 6.6 (H) 03/27/2015   HLD:  She was not taking her Crestor.    I would discharge no 20 mg when she goes home.    DISPOSITION:  She does not want to go home today.  I think it is reasonable, given the extent of her intervention, to keep her today to ambulate and plan discharge tomorrow.    Signed, Minus Breeding, MD  10/30/2015, 8:49 AM

## 2015-10-31 ENCOUNTER — Encounter (HOSPITAL_COMMUNITY): Payer: Self-pay | Admitting: Cardiology

## 2015-10-31 DIAGNOSIS — Z955 Presence of coronary angioplasty implant and graft: Secondary | ICD-10-CM

## 2015-10-31 DIAGNOSIS — E7800 Pure hypercholesterolemia, unspecified: Secondary | ICD-10-CM

## 2015-10-31 LAB — GLUCOSE, CAPILLARY
Glucose-Capillary: 140 mg/dL — ABNORMAL HIGH (ref 65–99)
Glucose-Capillary: 121 mg/dL — ABNORMAL HIGH (ref 65–99)
Glucose-Capillary: 227 mg/dL — ABNORMAL HIGH (ref 65–99)

## 2015-10-31 LAB — CBC
HEMATOCRIT: 41 % (ref 36.0–46.0)
Hemoglobin: 13.3 g/dL (ref 12.0–15.0)
MCH: 28.5 pg (ref 26.0–34.0)
MCHC: 32.4 g/dL (ref 30.0–36.0)
MCV: 88 fL (ref 78.0–100.0)
Platelets: 283 10*3/uL (ref 150–400)
RBC: 4.66 MIL/uL (ref 3.87–5.11)
RDW: 14.8 % (ref 11.5–15.5)
WBC: 9.8 10*3/uL (ref 4.0–10.5)

## 2015-10-31 LAB — BASIC METABOLIC PANEL
ANION GAP: 9 (ref 5–15)
BUN: 9 mg/dL (ref 6–20)
CHLORIDE: 108 mmol/L (ref 101–111)
CO2: 24 mmol/L (ref 22–32)
Calcium: 8.8 mg/dL — ABNORMAL LOW (ref 8.9–10.3)
Creatinine, Ser: 0.81 mg/dL (ref 0.44–1.00)
GFR calc Af Amer: >60 mL/min (ref >60)
GFR calc non Af Amer: >60 mL/min (ref >60)
GLUCOSE: 129 mg/dL — AB (ref 65–99)
POTASSIUM: 3.9 mmol/L (ref 3.5–5.1)
Sodium: 141 mmol/L (ref 135–145)

## 2015-10-31 MED ORDER — ROSUVASTATIN CALCIUM 20 MG PO TABS: 20.0000 mg | ORAL_TABLET | Freq: Every day | ORAL | 6 refills | Status: DC

## 2015-10-31 MED ORDER — TICAGRELOR 90 MG PO TABS: 90.0000 mg | ORAL_TABLET | Freq: Two times a day (BID) | ORAL | 11 refills | Status: DC

## 2015-10-31 MED ORDER — ATENOLOL 25 MG PO TABS: 25.0000 mg | ORAL_TABLET | Freq: Every day | ORAL | 6 refills | Status: DC

## 2015-10-31 NOTE — Progress Notes (Signed)
Pt received discharge teaching. Pt acknowledged understanding of discharge instructions. Pt IV was removed.

## 2015-10-31 NOTE — Progress Notes (Signed)
CARDIAC REHAB PHASE I   PRE:  Rate/Rhythm: 71  BP:  Sitting: 97/61     SaO2: 100  MODE:  Ambulation: 300 ft   POST:  Rate/Rhythm: 81  BP:  Sitting: 106/57     SaO2: 99ra  11:40-11:55 Patient ambulated independently with mild sob. Patient has tummy tightness. Nurse made aware. Placed sitting on edge of bed with call bell in reach.  [place], MS 10/31/2015 11:53 AM

## 2015-10-31 NOTE — Progress Notes (Signed)
Patient Name: [name] Date of Encounter: 10/31/2015  Primary Cardiologist: [facility] Problem List     Active Problems:   Unstable angina [facility])     Subjective   Has chest pains since having stents, not similar to pain she had prior to PCI. Did well ambulating with cardiac rehab.  Inpatient Medications    Scheduled Meds: . allopurinol  300 mg Oral Daily  . aspirin EC  81 mg Oral Daily  . atenolol  25 mg Oral Daily  . enoxaparin (LOVENOX) injection  40 mg Subcutaneous Q24H  . gabapentin  300 mg Oral QHS  . insulin aspart  0-15 Units Subcutaneous TID WC  . levothyroxine  150 mcg Oral QAC breakfast  . rosuvastatin  10 mg Oral Daily  . sodium chloride flush  3 mL Intravenous Q12H  . ticagrelor  90 mg Oral BID   Continuous Infusions: . nitroGLYCERIN Stopped (10/29/15 2345)   PRN Meds: sodium chloride, acetaminophen, HYDROcodone-acetaminophen, morphine injection, nitroGLYCERIN, ondansetron (ZOFRAN) IV, sodium chloride flush, traMADol   Vital Signs    Vitals:   10/30/15 2100 10/31/15 0500 10/31/15 0848 10/31/15 1421  BP: 126/76 139/78 109/68 (!) 107/55  Pulse: 69 70 68 69  Resp: 15 18    Temp: 98.5 F (36.9 C) 98.5 F (36.9 C) 97.8 F (36.6 C) 98.5 F (36.9 C)  TempSrc:   Oral Oral  SpO2: 98% 99%  97%  Weight:  224 lb (101.6 kg)    Height:        Intake/Output Summary (Last 24 hours) at 10/31/15 1434 Last data filed at 10/31/15 1200  Gross per 24 hour  Intake              462 ml  Output              500 ml  Net              -38 ml   Filed Weights   10/29/15 1254 10/29/15 1800 10/31/15 0500  Weight: 223 lb 4.8 oz (101.3 kg) 235 lb 14.3 oz (107 kg) 224 lb (101.6 kg)    Physical Exam    GEN: Well nourished, well developed, in no acute distress.  HEENT: Grossly normal.  Neck: Supple, no JVD, carotid bruits, or masses. Cardiac: RRR, no murmurs, rubs, or gallops. No clubbing, cyanosis, edema. Respiratory:  Respirations regular and unlabored,  clear to auscultation bilaterally. GI: Soft, obese. MS: no deformity or atrophy. Skin: warm and dry, no rash. Neuro:  Strength and sensation are intact. Psych: AAOx3.  Normal affect.  Labs    CBC  Recent Labs  10/30/15 0255 10/31/15 0512  WBC 9.5 9.8  HGB 13.0 13.3  HCT 39.8 41.0  MCV 86.7 88.0  PLT 274 [id number]   Basic Metabolic Panel  Recent Labs  10/30/15 0255 10/31/15 0512  NA 143 141  K 3.4* 3.9  CL 110 108  CO2 24 24  GLUCOSE 98 129*  BUN <5* 9  CREATININE 0.77 0.81  CALCIUM 8.2* 8.8*   Liver Function Tests No results for input(s): AST, ALT, ALKPHOS, BILITOT, PROT, ALBUMIN in the last 72 hours. No results for input(s): LIPASE, AMYLASE in the last 72 hours. Cardiac Enzymes  Recent Labs  10/29/15 0700 10/29/15 1415 10/29/15 1740  TROPONINI <0.03 <0.03 0.03*   BNP Invalid input(s): POCBNP D-Dimer No results for input(s): DDIMER in the last 72 hours. Hemoglobin A1C No results for input(s): HGBA1C in the last 72 hours. Fasting Lipid  Panel  Recent Labs  10/30/15 0255  CHOL 173  HDL 33*  LDLCALC 115*  TRIG 125  CHOLHDL 5.2   Thyroid Function Tests No results for input(s): TSH, T4TOTAL, T3FREE, THYROIDAB in the last 72 hours.  Invalid input(s): FREET3  Telemetry    - Personally Reviewed  ECG     - Personally Reviewed  Radiology    No results found.  Cardiac Studies  - Normal LV systolic function; grade 1 diastolic dysfunction.   1. Multivessel coronary artery disease, including 60% ostial and 70% proximal/mid RCA disease that is hemodynamically significant by FFR (0.71).  Mild in-stent restenosis of LAD stents, as well as 60% stenosis in the mid LCx adjacent to previously stented large OM are also present. 2. Successful FFR-guided PCI of the RCA from the ostium to the midvessel with three overlapping Promus Premier stents post-dilated to 3.25 mm with 0% residual stenosis and TIMI-3 flow. 3. Patient had significant pain chest pain  beginning with FFR and continuing throughout intervention despite aggressive sedation and analgesia.  No clear angiographic or electrocardiographic etiology was identified.  Pain had returned to pre-cath level by the end of the procedure.  Patient Profile      [name] is a 70 year old female with a past medical history of CAD s/p inferior MI in 1998 with PCI to RCA, prior PCI of the OM1 and LAD, 2015 DES to LAD, DM, HTN, and HLD. She presented with chest pain.    Assessment & Plan    1. Unstable angina s/p 3 overlapping stents to RCA. Continue DAPT for minimum of one year if not longer. Continue atenolol and Crestor.  2. Hyperlipidemia: Continue Crestor.  3. AAA: Follow with vascular surgery.  Dispo: Stable for discharge. Should have outpatient follow up within 7-10 days.   Signed, Kate Sable, MD  10/31/2015, 2:34 PM

## 2015-10-31 NOTE — Discharge Summary (Signed)
Discharge Summary    Patient ID: [name],  MRN: [id number], DOB/AGE: 1945-02-11 70 y.o.  Admit date: 10/29/2015 Discharge date: 10/31/2015  Primary Care Provider: [name] Primary Cardiologist: Dr. Burt Knack  Discharge Diagnoses    Active Problems:   Unstable angina [facility])   Hyperlipidemia   Allergies Allergies  Allergen Reactions  . Prilosec [Omeprazole] Other (See Comments)    Chest pain  . Miconazole Nitrate Hives    REACTION: hives  . Augmentin [Amoxicillin-Pot Clavulanate] Hives, Itching and Rash  . Doxycycline Other (See Comments)    REACTION: gi upset    Diagnostic Studies/Procedures    LHC: 10/12  Conclusion   Conclusions: 1. Multivessel coronary artery disease, including 60% ostial and 70% proximal/mid RCA disease that is hemodynamically significant by FFR (0.71).  Mild in-stent restenosis of LAD stents, as well as 60% stenosis in the mid LCx adjacent to previously stented large OM are also present. 2. Successful FFR-guided PCI of the RCA from the ostium to the midvessel with three overlapping Promus Premier stents post-dilated to 3.25 mm with 0% residual stenosis and TIMI-3 flow. 3. Patient had significant pain chest pain beginning with FFR and continuing throughout intervention despite aggressive sedation and analgesia.  No clear angiographic or electrocardiographic etiology was identified.  Pain had returned to pre-cath level by the end of the procedure.  Recommendations: 1. Transfer to 2-Heart for aggressive medical management of chest pain, including weaning of NTG infusion as tolerated. 2. Dual antiplatelet therapy with aspirin and ticagrelor for at least 12 months, ideally longer. 3. Aggressive secondary prevention. 4. If patient continues to have chest pain, consider repeat catheterization with FFR and/or PCI to mid LCx. 5. Due to marked tortuosity of the innominate/right subclavian artery, consider alternate vascular access for further cardiac  catheterizations.   Diagnostic Diagram     Post-Intervention Diagram      Echo: 10/12   - Left ventricle: The cavity size was normal. Wall thickness was   normal. Systolic function was normal. The estimated ejection   fraction was in the range of 55% to 60%. Wall motion was normal;   there were no regional wall motion abnormalities. Doppler   parameters are consistent with abnormal left ventricular   relaxation (grade 1 diastolic dysfunction). - Mitral valve: Calcified annulus.  Impressions:  - Normal LV systolic function; grade 1 diastolic dysfunction. _____________   History of Present Illness     70 yo female with PMH of CAD s/p inferior MI in 1998 with PCI to RCA, prior PCI of the OM1 and LAD, 2015 DES to LAD, DM, HTN, and HLD. She presented to the ED with reports of substernal chest pressure with radiation to her right arm and throat after getting up that morning to use the bathroom. She took 2 SL Nitro and her pain did not abate, so she called EMS. She got 3 additional SL Nitro and had moderate relief of her pain. At the time of my encounter, she was still having chest pressure at the time of my encounter that she rates as a 6/10. Her BP is soft with SBP's in the 90's, so Nitro gtt was not started initially in the ED.    Hospital Course     Consultants: None   She was admitted with heparin drip started and plan for LHC. Underwent cardiac cath with Dr. Saunders Revel showing multivessel CAD with 60 % Ost and 70% prox/mid RCA, along with mid instent restenosis of LAD stents and 60%  stenosis of the mid LCx. Successful PCI of the RCA to the [facility] with 3 overlapping DES. She was started on DAPT with ASA and Brilinta for at least 1 year with plans for aggressive secondary prevention measures.   She was returned to ICU post cath given she had significant chest pain during procedure. The following day she no longer reported chest pain, but she was observed an addition day without  any noted complications. She worked well with cardiac rehab.    On 10/14 she was seen and assessed by Dr. Bronson Ing and determined stable for discharge home. Her home discharge medications include: atenolol, crestor, asa, and brilinta. I have sent a message to arrange for follow up in the office.  _____________  Discharge Vitals Blood pressure (!) 107/55, pulse 69, temperature 98.5 F (36.9 C), temperature source Oral, resp. rate 18, height 5\' 6"  (1.676 m), weight 224 lb (101.6 kg), SpO2 97 %.  Filed Weights   10/29/15 1254 10/29/15 1800 10/31/15 0500  Weight: 223 lb 4.8 oz (101.3 kg) 235 lb 14.3 oz (107 kg) 224 lb (101.6 kg)    Labs & Radiologic Studies    CBC  Recent Labs  10/30/15 0255 10/31/15 0512  WBC 9.5 9.8  HGB 13.0 13.3  HCT 39.8 41.0  MCV 86.7 88.0  PLT 274 Q000111Q   Basic Metabolic Panel  Recent Labs  10/30/15 0255 10/31/15 0512  NA 143 141  K 3.4* 3.9  CL 110 108  CO2 24 24  GLUCOSE 98 129*  BUN <5* 9  CREATININE 0.77 0.81  CALCIUM 8.2* 8.8*   Liver Function Tests No results for input(s): AST, ALT, ALKPHOS, BILITOT, PROT, ALBUMIN in the last 72 hours. No results for input(s): LIPASE, AMYLASE in the last 72 hours. Cardiac Enzymes  Recent Labs  10/29/15 0700 10/29/15 1415 10/29/15 1740  TROPONINI <0.03 <0.03 0.03*   BNP Invalid input(s): POCBNP D-Dimer No results for input(s): DDIMER in the last 72 hours. Hemoglobin A1C No results for input(s): HGBA1C in the last 72 hours. Fasting Lipid Panel  Recent Labs  10/30/15 0255  CHOL 173  HDL 33*  LDLCALC 115*  TRIG 125  CHOLHDL 5.2   Thyroid Function Tests No results for input(s): TSH, T4TOTAL, T3FREE, THYROIDAB in the last 72 hours.  Invalid input(s): FREET3 _____________  Dg Chest 2 View  Result Date: 10/29/2015 CLINICAL DATA:  Chest pain. EXAM: CHEST  2 VIEW COMPARISON:  Radiographs of April 06, 2014. FINDINGS: The heart size and mediastinal contours are within normal limits.  Atherosclerosis of thoracic aorta is noted. No pneumothorax or pleural effusion is noted. Multilevel degenerative disc disease is noted in thoracic spine. Right lung is clear. Stable minimal left basilar scarring is noted. IMPRESSION: No active cardiopulmonary disease.  Aortic atherosclerosis. Electronically Signed   By: Marijo Conception, M.D.   On: 10/29/2015 07:43   Disposition   Pt is being discharged home today in good condition.  Follow-up Plans & Appointments    Follow-up Information    Sherren Mocha, MD .   Specialty:  Cardiology Why:  The office will call you with an appt within 2-3 days. Please given [place] a call if you have not received a call within that time frame.  Contact information: [phone] [phone]          Discharge Instructions    AMB Referral to Cardiac Rehabilitation - Phase II    Complete by:  As directed  Diagnosis:  Coronary Stents   Amb Referral to Cardiac Rehabilitation    Complete by:  As directed    Diagnosis:  Coronary Stents   Diet - low sodium heart healthy    Complete by:  As directed    Discharge instructions    Complete by:  As directed    Radial Site Care Refer to this sheet in the next few weeks. These instructions provide you with information on caring for yourself after your procedure. Your caregiver may also give you more specific instructions. Your treatment has been planned according to current medical practices, but problems sometimes occur. Call your caregiver if you have any problems or questions after your procedure. HOME CARE INSTRUCTIONS You may shower the day after the procedure.Remove the bandage (dressing) and gently wash the site with plain soap and water.Gently pat the site dry.  Do not apply powder or lotion to the site.  Do not submerge the affected site in water for 3 to 5 days.  Inspect the site at least twice daily.  Do not flex or bend the affected arm for 24 hours.    No lifting over 5 pounds (2.3 kg) for 5 days after your procedure.  Do not drive home if you are discharged the same day of the procedure. Have someone else drive you.  You may drive 24 hours after the procedure unless otherwise instructed by your caregiver.  What to expect: Any bruising will usually fade within 1 to 2 weeks.  Blood that collects in the tissue (hematoma) may be painful to the touch. It should usually decrease in size and tenderness within 1 to 2 weeks.  SEEK IMMEDIATE MEDICAL CARE IF: You have unusual pain at the radial site.  You have redness, warmth, swelling, or pain at the radial site.  You have drainage (other than a small amount of blood on the dressing).  You have chills.  You have a fever or persistent symptoms for more than 72 hours.  You have a fever and your symptoms suddenly get worse.  Your arm becomes pale, cool, tingly, or numb.  You have heavy bleeding from the site. Hold pressure on the site.   Increase activity slowly    Complete by:  As directed       Discharge Medications   Current Discharge Medication List    START taking these medications   Details  ticagrelor (BRILINTA) 90 MG TABS tablet Take 1 tablet (90 mg total) by mouth 2 (two) times daily. Qty: 60 tablet, Refills: 11      CONTINUE these medications which have CHANGED   Details  atenolol (TENORMIN) 25 MG tablet Take 1 tablet (25 mg total) by mouth daily. Qty: 30 tablet, Refills: 6    rosuvastatin (CRESTOR) 20 MG tablet Take 1 tablet (20 mg total) by mouth daily. Qty: 30 tablet, Refills: 6      CONTINUE these medications which have NOT CHANGED   Details  aspirin 81 MG EC tablet Take 81 mg by mouth.     metFORMIN (GLUCOPHAGE-XR) 500 MG 24 hr tablet TAKE 4 TABLETS BY MOUTH EVERY MORNING Qty: 360 tablet, Refills: 0    nitroGLYCERIN (NITROSTAT) 0.4 MG SL tablet Place 0.4 mg under the tongue every 5 (five) minutes as needed for chest pain.    SYNTHROID 150 MCG tablet TAKE 1  TABLET BY MOUTH EVERY DAY Qty: 90 tablet, Refills: 0    acetaminophen (TYLENOL) 325 MG tablet Take 2 tablets (650 mg total) by mouth every  4 (four) hours as needed for headache or mild pain.    allopurinol (ZYLOPRIM) 300 MG tablet Take 1 tablet (300 mg total) by mouth daily. Qty: 30 tablet, Refills: 6    gabapentin (NEURONTIN) 300 MG capsule nightly Qty: 30 capsule, Refills: 3    traMADol (ULTRAM) 50 MG tablet Take 1 tablet (50 mg total) by mouth every 6 (six) hours as needed. Qty: 60 tablet, Refills: 1    Vitamin D, Ergocalciferol, (DRISDOL) 50000 units CAPS capsule Take 1 capsule (50,000 Units total) by mouth every 7 (seven) days. Qty: 12 capsule, Refills: 0      STOP taking these medications     Acetaminophen-Codeine (TYLENOL/CODEINE #3) 300-30 MG tablet      colchicine 0.6 MG tablet      hydrOXYzine (ATARAX/VISTARIL) 10 MG tablet      tiZANidine (ZANAFLEX) 4 MG tablet      HYDROcodone-acetaminophen (NORCO) 10-325 MG tablet          Aspirin prescribed at discharge?  Yes High Intensity Statin Prescribed? (Lipitor 40-80mg  or Crestor 20-40mg ): Yes Beta Blocker Prescribed? Yes For EF <40%, was ACEI/ARB Prescribed? No: EF ok ADP Receptor Inhibitor Prescribed? (i.e. Plavix etc.-Includes Medically Managed Patients): Yes For EF <40%, Aldosterone Inhibitor Prescribed? No: EF ok Was EF assessed during THIS hospitalization? Yes Was Cardiac Rehab II ordered? (Included Medically managed Patients): Yes   Outstanding Labs/Studies   Consider LFTs and FLP in 6-8 weeks if tolerating statin increase  Duration of Discharge Encounter   Greater than 30 minutes including physician time.  Signed, Reino Bellis NP-C 10/31/2015, 4:20 PM

## 2015-11-01 ENCOUNTER — Other Ambulatory Visit: Payer: Self-pay | Admitting: Internal Medicine

## 2015-11-02 ENCOUNTER — Telehealth: Payer: Self-pay | Admitting: Physician Assistant

## 2015-11-02 ENCOUNTER — Telehealth: Payer: Self-pay | Admitting: *Deleted

## 2015-11-02 NOTE — Telephone Encounter (Signed)
Pt was on TCM list admitted for Unstable angina ([place]) & Hyperlipidemia. She was admitted with heparin drip started and plan for LHC. Underwent cardiac cath with Dr. Saunders Revel showing multivessel CAD with 60 % Ost and 70% prox/mid RCA, along with mid instent restenosis of LAD stents and 60% stenosis of the mid LCx. Successful PCI of the RCA to the [facility] with 3 overlapping DES. She was started on DAPT with ASA and Brilinta for at least 1 year with plans for aggressive secondary prevention measures. D/C 10/14, and will f/u w/Dr. Copper cardiology in 2 wks...[name]

## 2015-11-02 NOTE — Telephone Encounter (Signed)
Left message for patient to call back  

## 2015-11-02 NOTE — Telephone Encounter (Signed)
New message      Message left on vm with TCM appt scheduled for 11-09-15 with Richardson Dopp per Ria Comment.

## 2015-11-03 NOTE — Telephone Encounter (Signed)
Follow up ° ° °Pt verbalized that she is returning call for rn °

## 2015-11-03 NOTE — Telephone Encounter (Signed)
Patient contacted regarding discharge from [facility] on 10/14. .  Patient understands to follow up with provider --Melbourne Abts, [place] on 11/09/15 at 8:45 at [place] --[address].  Patient understands discharge instructions? yes Patient understands medications and regiment? Yes-- She has been unable to fill Brilinta prescription. Patient understands to bring all medications to this visit? yes  Pt reports she went to pharmacy to pick up Brilinta but they were unable to fill for her because they were waiting to hear from insurance. I spoke with Rob at [employer] [employer] who reports pt was given 3 tablets of Brilinta. Free 30 day card did not work for pt but Rob states pt has been notified prescription is ready and would cost $24.99.  They will run card again and contact phone number on card to follow up on this.  I spoke with pt who confirms she did receive 3 Brilinta tablets and took last dose on Sunday evening.  She states she will eventually be able to pay $24.99 but cannot afford this today.  I explained to pt the importance of taking Brilinta and told her I would leave samples at the front desk for her to pick up.  Brilinta 90 mg, 32 tablets, Lot [id number], exp 3/20 left at front desk. Pt reports her daughter will pick up samples.

## 2015-11-03 NOTE — Telephone Encounter (Signed)
Pt aware daughter will need to come to office today to pick up Brilinta. Pt aware daughter will need to be here by 5:00.  Pt verbalized understanding.

## 2015-11-03 NOTE — Telephone Encounter (Signed)
Thanks for notification, and for giving her samples.

## 2015-11-04 ENCOUNTER — Encounter: Payer: Self-pay | Admitting: Physician Assistant

## 2015-11-06 ENCOUNTER — Ambulatory Visit (INDEPENDENT_AMBULATORY_CARE_PROVIDER_SITE_OTHER): Payer: Medicare Other | Admitting: Nurse Practitioner

## 2015-11-06 ENCOUNTER — Encounter: Payer: Self-pay | Admitting: Nurse Practitioner

## 2015-11-06 ENCOUNTER — Other Ambulatory Visit (INDEPENDENT_AMBULATORY_CARE_PROVIDER_SITE_OTHER): Payer: Medicare Other

## 2015-11-06 VITALS — BP 118/82 | HR 86 | Temp 98.5°F | Ht 66.0 in | Wt 224.0 lb

## 2015-11-06 DIAGNOSIS — R110 Nausea: Secondary | ICD-10-CM | POA: Diagnosis not present

## 2015-11-06 DIAGNOSIS — R1031 Right lower quadrant pain: Secondary | ICD-10-CM

## 2015-11-06 DIAGNOSIS — I200 Unstable angina: Secondary | ICD-10-CM | POA: Diagnosis not present

## 2015-11-06 LAB — CBC WITH DIFFERENTIAL/PLATELET
BASOS ABS: 0 10*3/uL (ref 0.0–0.1)
Basophils Relative: 0.4 % (ref 0.0–3.0)
EOS ABS: 0.1 10*3/uL (ref 0.0–0.7)
Eosinophils Relative: 1.3 % (ref 0.0–5.0)
HEMATOCRIT: 42.8 % (ref 36.0–46.0)
Hemoglobin: 14.2 g/dL (ref 12.0–15.0)
LYMPHS PCT: 22.5 % (ref 12.0–46.0)
Lymphs Abs: 2.2 10*3/uL (ref 0.7–4.0)
MCHC: 33.3 g/dL (ref 30.0–36.0)
MCV: 86.8 fl (ref 78.0–100.0)
MONOS PCT: 6 % (ref 3.0–12.0)
Monocytes Absolute: 0.6 10*3/uL (ref 0.1–1.0)
NEUTROS ABS: 6.9 10*3/uL (ref 1.4–7.7)
Neutrophils Relative %: 69.8 % (ref 43.0–77.0)
PLATELETS: 325 10*3/uL (ref 150.0–400.0)
RBC: 4.93 Mil/uL (ref 3.87–5.11)
RDW: 14.8 % (ref 11.5–15.5)
WBC: 9.9 10*3/uL (ref 4.0–10.5)

## 2015-11-06 LAB — HEPATIC FUNCTION PANEL
ALK PHOS: 83 U/L (ref 39–117)
ALT: 27 U/L (ref 0–35)
AST: 21 U/L (ref 0–37)
Albumin: 4.1 g/dL (ref 3.5–5.2)
BILIRUBIN DIRECT: 0.1 mg/dL (ref 0.0–0.3)
TOTAL PROTEIN: 8.1 g/dL (ref 6.0–8.3)
Total Bilirubin: 0.5 mg/dL (ref 0.2–1.2)

## 2015-11-06 MED ORDER — TRAMADOL HCL 50 MG PO TABS: 50.0000 mg | ORAL_TABLET | Freq: Four times a day (QID) | ORAL | 1 refills | Status: DC | PRN

## 2015-11-06 NOTE — Progress Notes (Signed)
Subjective:  Patient ID: [name], female    DOB: December 30, 1945  Age: 70 y.o. MRN: [id number]  CC: Leg Pain (Pt stated Rt upper thigh/lower stomach very painful/bruise for about 1 week.)   Abdominal Pain  This is a new problem. The current episode started in the past 7 days. The onset quality is gradual. The problem occurs constantly. The problem has been rapidly worsening. The pain is located in the RLQ (right groin and right thigh). The pain is at a severity of 10/10. The pain is severe. The quality of the pain is aching, dull and a sensation of fullness. The abdominal pain radiates to the pelvis (right thigh). Associated symptoms include anorexia and nausea. Pertinent negatives include no constipation, diarrhea, hematochezia, hematuria, melena or vomiting. The pain is aggravated by palpation (movement). The pain is relieved by being still. She has tried nothing for the symptoms.    Outpatient Medications Prior to Visit  Medication Sig Dispense Refill  . acetaminophen (TYLENOL) 325 MG tablet Take 2 tablets (650 mg total) by mouth every 4 (four) hours as needed for headache or mild pain.    [name] allopurinol (ZYLOPRIM) 300 MG tablet Take 1 tablet (300 mg total) by mouth daily. 30 tablet 6  . aspirin 81 MG EC tablet Take 81 mg by mouth.     [name] atenolol (TENORMIN) 25 MG tablet Take 1 tablet (25 mg total) by mouth daily. 30 tablet 6  . gabapentin (NEURONTIN) 300 MG capsule nightly 30 capsule 3  . metFORMIN (GLUCOPHAGE-XR) 500 MG 24 hr tablet TAKE 4 TABLETS BY MOUTH EVERY MORNING 360 tablet 0  . nitroGLYCERIN (NITROSTAT) 0.4 MG SL tablet Place 0.4 mg under the tongue every 5 (five) minutes as needed for chest pain.    . rosuvastatin (CRESTOR) 20 MG tablet Take 1 tablet (20 mg total) by mouth daily. 30 tablet 6  . SYNTHROID 150 MCG tablet TAKE 1 TABLET BY MOUTH EVERY DAY 90 tablet 1  . ticagrelor (BRILINTA) 90 MG TABS tablet Take 1 tablet (90 mg total) by mouth 2 (two) times daily. 60 tablet 11  .  Vitamin D, Ergocalciferol, (DRISDOL) 50000 units CAPS capsule Take 1 capsule (50,000 Units total) by mouth every 7 (seven) days. 12 capsule 0  . traMADol (ULTRAM) 50 MG tablet Take 1 tablet (50 mg total) by mouth every 6 (six) hours as needed. 60 tablet 1   No facility-administered medications prior to visit.     ROS See HPI  Objective:  BP 118/82 (BP Location: Left Arm, Patient Position: Sitting, Cuff Size: Normal)   Pulse 86   Temp 98.5 F (36.9 C)   Ht 5\' 6"  (1.676 m)   Wt 224 lb (101.6 kg)   SpO2 98%   BMI 36.15 kg/m   BP Readings from Last 3 Encounters:  11/06/15 118/82  10/31/15 (!) 107/55  10/06/15 118/72    Wt Readings from Last 3 Encounters:  11/06/15 224 lb (101.6 kg)  10/31/15 224 lb (101.6 kg)  10/06/15 221 lb (100.2 kg)    Physical Exam  Constitutional: She is oriented to person, place, and time.  Cardiovascular: Normal rate.   Abdominal: Soft. Bowel sounds are normal. She exhibits no distension. There is no hepatosplenomegaly. There is tenderness in the right lower quadrant. There is guarding. There is no CVA tenderness.  Musculoskeletal: She exhibits tenderness. She exhibits no edema.       Right hip: She exhibits decreased range of motion, decreased strength and tenderness. She exhibits  no bony tenderness and no swelling.       Right knee: Normal.       Right ankle: Normal.       Lumbar back: She exhibits no tenderness and no bony tenderness.  Pain with forward flex of right hip. 4/5 upper leg strength. No pain on right trochanter bursa.  Neurological: She is alert and oriented to person, place, and time.  Skin: Skin is warm and dry. No rash noted. No erythema.    Lab Results  Component Value Date   WBC 9.8 10/31/2015   HGB 13.3 10/31/2015   HCT 41.0 10/31/2015   PLT 283 10/31/2015   GLUCOSE 129 (H) 10/31/2015   CHOL 173 10/30/2015   TRIG 125 10/30/2015   HDL 33 (L) 10/30/2015   LDLDIRECT 131.0 03/27/2015   LDLCALC 115 (H) 10/30/2015   ALT  22 03/27/2015   AST 21 03/27/2015   NA 141 10/31/2015   K 3.9 10/31/2015   CL 108 10/31/2015   CREATININE 0.81 10/31/2015   BUN 9 10/31/2015   CO2 24 10/31/2015   TSH 6.39 (H) 03/27/2015   INR 1.03 10/30/2015   HGBA1C 6.6 (H) 03/27/2015   MICROALBUR 0.7 03/27/2015    Dg Chest 2 View  Result Date: 10/29/2015 CLINICAL DATA:  Chest pain. EXAM: CHEST  2 VIEW COMPARISON:  Radiographs of April 06, 2014. FINDINGS: The heart size and mediastinal contours are within normal limits. Atherosclerosis of thoracic aorta is noted. No pneumothorax or pleural effusion is noted. Multilevel degenerative disc disease is noted in thoracic spine. Right lung is clear. Stable minimal left basilar scarring is noted. IMPRESSION: No active cardiopulmonary disease.  Aortic atherosclerosis. Electronically Signed   By: Marijo Conception, M.D.   On: 10/29/2015 07:43    Assessment & Plan:   Naamah was seen today for leg pain.  Diagnoses and all orders for this visit:  Right groin pain -     Cancel: CT ABDOMEN PELVIS WO CONTRAST; Future -     CT ABDOMEN PELVIS W CONTRAST; Future -     CBC w/Diff; Future -     Hepatic function panel; Future -     traMADol (ULTRAM) 50 MG tablet; Take 1 tablet (50 mg total) by mouth every 6 (six) hours as needed.  RLQ abdominal pain -     Cancel: CT ABDOMEN PELVIS WO CONTRAST; Future -     CT ABDOMEN PELVIS W CONTRAST; Future -     CBC w/Diff; Future -     Hepatic function panel; Future -     traMADol (ULTRAM) 50 MG tablet; Take 1 tablet (50 mg total) by mouth every 6 (six) hours as needed.  Nausea without vomiting -     Cancel: CT ABDOMEN PELVIS WO CONTRAST; Future -     CT ABDOMEN PELVIS W CONTRAST; Future -     CBC w/Diff; Future -     Hepatic function panel; Future -     traMADol (ULTRAM) 50 MG tablet; Take 1 tablet (50 mg total) by mouth every 6 (six) hours as needed.   I am having Ms. Owens Shark maintain her acetaminophen, aspirin, nitroGLYCERIN, metFORMIN, allopurinol,  gabapentin, Vitamin D (Ergocalciferol), atenolol, rosuvastatin, ticagrelor, SYNTHROID, and traMADol.  Meds ordered this encounter  Medications  . traMADol (ULTRAM) 50 MG tablet    Sig: Take 1 tablet (50 mg total) by mouth every 6 (six) hours as needed.    Dispense:  14 tablet    Refill:  1  Order Specific Question:   Supervising Provider    Answer:   Cassandria Anger [[id number]]    Follow-up: Return if symptoms worsen or fail to improve.  Wilfred Lacy, NP

## 2015-11-06 NOTE — Progress Notes (Signed)
Pre visit review using our clinic review tool, if applicable. No additional management support is needed unless otherwise documented below in the visit note. 

## 2015-11-09 ENCOUNTER — Ambulatory Visit (INDEPENDENT_AMBULATORY_CARE_PROVIDER_SITE_OTHER)
Admission: RE | Admit: 2015-11-09 | Discharge: 2015-11-09 | Disposition: A | Discharge status: Home / Self Care | Payer: Medicare Other | Source: Ambulatory Visit | Attending: Nurse Practitioner | Admitting: Nurse Practitioner

## 2015-11-09 ENCOUNTER — Encounter: Payer: Self-pay | Admitting: Physician Assistant

## 2015-11-09 ENCOUNTER — Ambulatory Visit (INDEPENDENT_AMBULATORY_CARE_PROVIDER_SITE_OTHER): Payer: Medicare Other | Admitting: Physician Assistant

## 2015-11-09 VITALS — BP 110/70 | HR 82 | Ht 66.0 in | Wt 224.8 lb

## 2015-11-09 DIAGNOSIS — R1031 Right lower quadrant pain: Secondary | ICD-10-CM

## 2015-11-09 DIAGNOSIS — I714 Abdominal aortic aneurysm, without rupture, unspecified: Secondary | ICD-10-CM

## 2015-11-09 DIAGNOSIS — I10 Essential (primary) hypertension: Secondary | ICD-10-CM | POA: Diagnosis not present

## 2015-11-09 DIAGNOSIS — I2510 Atherosclerotic heart disease of native coronary artery without angina pectoris: Secondary | ICD-10-CM

## 2015-11-09 DIAGNOSIS — R110 Nausea: Secondary | ICD-10-CM

## 2015-11-09 DIAGNOSIS — E7800 Pure hypercholesterolemia, unspecified: Secondary | ICD-10-CM | POA: Diagnosis not present

## 2015-11-09 DIAGNOSIS — K5730 Diverticulosis of large intestine without perforation or abscess without bleeding: Secondary | ICD-10-CM | POA: Diagnosis not present

## 2015-11-09 MED ORDER — NITROGLYCERIN 0.4 MG SL SUBL: 0.4000 mg | SUBLINGUAL_TABLET | SUBLINGUAL | 3 refills | Status: DC | PRN

## 2015-11-09 MED ORDER — ATENOLOL 25 MG PO TABS: 25.0000 mg | ORAL_TABLET | Freq: Two times a day (BID) | ORAL | 3 refills | Status: DC

## 2015-11-09 MED ORDER — FAMOTIDINE 20 MG PO TABS: 20.0000 mg | ORAL_TABLET | Freq: Every day | ORAL | 6 refills | Status: DC

## 2015-11-09 MED ORDER — IOPAMIDOL (ISOVUE-300) INJECTION 61%
100.0000 mL | Freq: Once | INTRAVENOUS | Status: AC | PRN
  Administered 2015-11-09: 100 mL via INTRAVENOUS

## 2015-11-09 NOTE — Patient Instructions (Addendum)
Medication Instructions:  1. INCREASE ATENOLOL TO 25 MG TWICE DAILY; NEW RX HAS BEEN SENT IN 2. START PEPCID 20 MG AT BEDTIME; RX HAS BEEN SENT IN 3. RX FOR NTG HAS BEEN SENT IN  Labwork: NONE  Testing/Procedures: NONE  Follow-Up: DR. Burt Knack ON 02/08/16 @ 10 AM  Any Other Special Instructions Will Be Listed Below (If Applicable).  If you need a refill on your cardiac medications before your next appointment, please call your pharmacy.

## 2015-11-09 NOTE — Progress Notes (Signed)
Cardiology Office Note:    Date:  11/09/2015   ID:  [name], DOB 16-Jan-1946, MRN [id number]  PCP:  [name], MD  Cardiologist:  Dr. Sherren Mocha   Electrophysiologist:  n/a  Referring MD: Biagio Borg, MD   Chief Complaint  Patient presents with  . Hospitalization Follow-up    [place] >> s/p PCI    History of Present Illness:    [name] is a 70 y.o. female with a hx of CAD s/p prior stenting to the LAD and LCx, DM, HTN, HL, AAA.  Last LHC in 2/16 demonstrated patent LAD and LCx stents with mild disease in the RCA.  Last seen by Dr. Sherren Mocha in 6/17.  Admitted 10/12/-10/14 with [place].  LHC demonstrated ostial 60% and proximal/mid 70% stenosis in the RCA, mild ISR of the LAD stents and 60% stenosis in the mid LCx adjacent to the previously stented OM.  RCA disease was hemodynamically significant by FFR and the patient underwent PCI with 3 overlapping DES to the RCA.  She did have significant  chest pain post PCI.  She returns for FU.    She tells me she had a bad experience in the hospital. Dr. Saunders Revel did her cath/PCI.  She is upset that she never saw him again after the PCI. She had a lot of chest pain during the procedure as well.  She was doing well after DC until a few nights ago.  She woke up around 4 am and had chest pain for which she took 2 NTG with relief.  It happened again 2 nights ago.  She notes symptoms similar to her prior angina that brought her to the hospital. She is not sure if she was short of breath with this chest pain.  She denies PND, edema, syncope. She sleeps on an incline. She has not had any further chest pain.   Prior CV studies that were reviewed today include:    LHC 10/29/15 LM ok LAD prox stent patent with 30 ISR, dist 70, D2 70 LCx mid 60, OM2 stent patent RCA ost 60, ost-prox 50, prox-mid 70, mid 50, RPDA 40 FFR of RCA 0.71 (hemodynamically significant) PCI: 3 x 28 mm Promus, 3 x 16 mm Promus, 3 x 12 mm Promus DES to RCA   Conclusions: 1. Multivessel coronary artery disease, including 60% ostial and 70% proximal/mid RCA disease that is hemodynamically significant by FFR (0.71).  Mild in-stent restenosis of LAD stents, as well as 60% stenosis in the mid LCx adjacent to previously stented large OM are also present. 2. Successful FFR-guided PCI of the RCA from the ostium to the midvessel with three overlapping Promus Premier stents post-dilated to 3.25 mm with 0% residual stenosis and TIMI-3 flow. 3. Patient had significant pain chest pain beginning with FFR and continuing throughout intervention despite aggressive sedation and analgesia.  No clear angiographic or electrocardiographic etiology was identified.  Pain had returned to pre-cath level by the end of the procedure.  Echo 10/29/15 EF 55-60, no RWMA, Gr 1 DD, MAC  Echo 11/16 EF 55-60, Gr 1 DD  LHC 2/16 LM ok LAD prox stent ok LCx stent ok RCA prox 40 EF 55-60   Abdominal US 12/15 4 x 4 cm AAA  LHC (06/06/13):   prox LAD 90%, mid LAD stent ok with 40% ISR, small caliber Dx prox 40% mid 70% (inf sub-branch), OM1 stent ok, prox RCA 50% ,mid RCA 40%, EF 65%.   PCI:  3.0  x 22 mm Resolute DES to proximal LAD  Echo (02/2012):   EF 55% to 60%. Wall motion was normal.   Past Medical History:  Diagnosis Date  . AAA (abdominal aortic aneurysm) ([place])    a. 4.7cm in 08/2012. Followed by [facility].  . Allergic rhinitis, cause unspecified   . Anxiety state, unspecified   . Chronic LBP   . Coronary artery disease    a. s/p inferior MI 1998->PCI of RCA. b. S/p prior PCI of the OM1 and LAD;  c. 05/2013 Cath/PCI: LM nl, LAD 90p (3.0x22 Resolute DES), 20m w/ patent stent, Diag 40p, 73m, LCX patent OM1 stent, RCA 50p, 26m, EF 65%. d. 10/12 PCI with DESx3 to RCA  . Degeneration of lumbar or lumbosacral intervertebral disc   . Depressive disorder, not elsewhere classified   . Diabetes mellitus    TYPE II  . Gout 08/22/2013  . Hyperlipidemia   . Hypertension   .  Hypothyroidism   . Lower GI bleed 06/2010   Diverticular bleed  . Noncompliance with medications 02/26/2014  . Obesity, unspecified     Past Surgical History:  Procedure Laterality Date  . CARDIAC CATHETERIZATION     PCI OF BOTH THE CIRCUMFLEX AND LEFT ANTERIOR DESCENDING ARTERY  . CARDIAC CATHETERIZATION N/A 10/29/2015   Procedure: Coronary Stent Intervention;  Surgeon: Nelva Bush, MD;  Location: [facility];  Service: Cardiovascular;  Laterality: N/A;  . CARDIAC CATHETERIZATION N/A 10/29/2015   Procedure: Coronary/Graft Angiography;  Surgeon: Nelva Bush, MD;  Location: [facility];  Service: Cardiovascular;  Laterality: N/A;  . CARDIAC CATHETERIZATION N/A 10/29/2015   Procedure: Intravascular Pressure Wire/FFR Study;  Surgeon: Nelva Bush, MD;  Location: [facility];  Service: Cardiovascular;  Laterality: N/A;  . CESAREAN SECTION    . HEART STENT  04-2010  and  Jun 07, 2013   X 3  . LEFT HEART CATHETERIZATION WITH CORONARY ANGIOGRAM N/A 06/07/2013   Procedure: LEFT HEART CATHETERIZATION WITH CORONARY ANGIOGRAM;  Surgeon: [name], MD;  [facility];  Service: Cardiovascular;  Laterality: N/A;  . LEFT HEART CATHETERIZATION WITH CORONARY ANGIOGRAM N/A 02/25/2014   Procedure: LEFT HEART CATHETERIZATION WITH CORONARY ANGIOGRAM;  Surgeon: Troy Sine, MD;  Location: [facility];  Service: Cardiovascular;  Laterality: N/A;  . LUMBAR FUSION  01/2007   DR. Patrice Paradise...3-LEVEL WITH FIXATION  . PARATHYROIDECTOMY    . SPINE SURGERY    . THYROIDECTOMY    . TOTAL ABDOMINAL HYSTERECTOMY      Current Medications: Current Meds  Medication Sig  . acetaminophen (TYLENOL) 325 MG tablet Take 2 tablets (650 mg total) by mouth every 4 (four) hours as needed for headache or mild pain.  [name] allopurinol (ZYLOPRIM) 300 MG tablet Take 1 tablet (300 mg total) by mouth daily.  [name] aspirin 81 MG EC tablet Take 81 mg by mouth daily.   [name] gabapentin (NEURONTIN)  300 MG capsule Take 300 mg by mouth at bedtime.  . metFORMIN (GLUCOPHAGE-XR) 500 MG 24 hr tablet TAKE 4 TABLETS BY MOUTH EVERY MORNING  . nitroGLYCERIN (NITROSTAT) 0.4 MG SL tablet Place 1 tablet (0.4 mg total) under the tongue every 5 (five) minutes as needed for chest pain.  . rosuvastatin (CRESTOR) 20 MG tablet Take 1 tablet (20 mg total) by mouth daily.  [name] SYNTHROID 150 MCG tablet TAKE 1 TABLET BY MOUTH EVERY DAY  . ticagrelor (BRILINTA) 90 MG TABS tablet Take 1 tablet (90 mg total) by mouth 2 (  two) times daily.  . traMADol (ULTRAM) 50 MG tablet Take 50 mg by mouth every 6 (six) hours as needed for moderate pain.  . Vitamin D, Ergocalciferol, (DRISDOL) 50000 units CAPS capsule Take 1 capsule (50,000 Units total) by mouth every 7 (seven) days.  . [DISCONTINUED] atenolol (TENORMIN) 25 MG tablet Take 1 tablet (25 mg total) by mouth daily.  . [DISCONTINUED] nitroGLYCERIN (NITROSTAT) 0.4 MG SL tablet Place 0.4 mg under the tongue every 5 (five) minutes as needed for chest pain.     Allergies:   Prilosec [omeprazole]; Miconazole nitrate; Augmentin [amoxicillin-pot clavulanate]; and Doxycycline   Social History   Social History  . Marital status: Divorced    Spouse name: N/A  . Number of children: 3  . Years of education: N/A   Occupational History  . [employer]    ADMIN SUPPORT  .  Retired   Social History Main Topics  . Smoking status: Former Smoker    Years: 30.00    Types: Cigarettes    Quit date: 05/31/1986  . Smokeless tobacco: Never Used  . Alcohol use No  . Drug use: No  . Sexual activity: Not Asked   Other Topics Concern  . None   Social History Narrative   DIVORCED   3 CHILDREN   PATIENT SIGNED A DESIGNATED PARTY RELEASE TO ALLOW HER DAUGHTER, TRAMAINE Eckert, TO HAVE ACCESS TO HER MEDICAL RECORDS/INFORMATION. [name], May 04, 2009 @ 3:27 PM   Smokes cigarettes on rare occasions.     Family History:  The patient's family history includes  Arthritis in her maternal aunt; Cancer in her maternal aunt; Diabetes in her brother and maternal aunt; Heart attack (age of onset: 30) in her mother; Heart attack (age of onset: 53) in her father; Heart disease in her father and mother.   ROS:   Please see the history of present illness.    ROS All other systems reviewed and are negative.   EKGs/Labs/Other Test Reviewed:    EKG:  EKG is  ordered today.  The ekg ordered today demonstrates NSR, HR 82, LAD, ant Q waves, no ST changes, QTc 457  Recent Labs: 03/27/2015: TSH 6.39 10/31/2015: BUN 9; Creatinine, Ser 0.81; Potassium 3.9; Sodium 141 11/06/2015: ALT 27; Hemoglobin 14.2; Platelets 325.0   Recent Lipid Panel    Component Value Date/Time   CHOL 173 10/30/2015 0255   TRIG 125 10/30/2015 0255   TRIG 102 01/02/2006 0825   HDL 33 (L) 10/30/2015 0255   CHOLHDL 5.2 10/30/2015 0255   VLDL 25 10/30/2015 0255   LDLCALC 115 (H) 10/30/2015 0255   LDLDIRECT 131.0 03/27/2015 1137     Physical Exam:    VS:  BP 110/70   Pulse 82   Ht 5\' 6"  (1.676 m)   Wt 224 lb 12.8 oz (102 kg)   BMI 36.28 kg/m     Wt Readings from Last 3 Encounters:  11/09/15 224 lb 12.8 oz (102 kg)  11/06/15 224 lb (101.6 kg)  10/31/15 224 lb (101.6 kg)     Physical Exam  Constitutional: She is oriented to person, place, and time. She appears well-developed and well-nourished. No distress.  HENT:  Head: Normocephalic and atraumatic.  Eyes: No scleral icterus.  Neck: No JVD present.  Cardiovascular: Regular rhythm and normal heart sounds.   No murmur heard. Pulmonary/Chest: Effort normal. She has no wheezes. She has no rales.  Abdominal: Soft. There is no tenderness.  Musculoskeletal: She exhibits no  edema.  R wrist without hematoma  Neurological: She is alert and oriented to person, place, and time.  Skin: Skin is warm and dry.  Psychiatric: She has a normal mood and affect.    ASSESSMENT:    1. Coronary artery disease involving native coronary  artery of native heart without angina pectoris   2. Essential hypertension   3. Pure hypercholesterolemia   4. AAA (abdominal aortic aneurysm) without rupture (HCC)    PLAN:    In order of problems listed above:  1. CAD - She is s/p prior stenting to the LAD and LCx.  Recent admission with [place] and significant RCA stenosis by FFR.  She underwent overlapping DES x 3 to the RCA.  She notes 2 recent episodes of chest pain since DC.  She does have residual moderate disease in the distal LAD and D2 as well as the mid LCx.  Her ECG is unchanged and she notes adherence to Brilinta. I reviewed her case with Dr. Sherren Mocha.  We think that advancing her medical Rx is appropriate at this time.  -  Continue ASA, Brilinta, statin.  -  Increase Tenormin to 25 mg bid  -  FU in 3 mos, sooner if recurrent symptoms.  2. HTN - BP is controlled.   3. HL - Continue Rosuvastatin 20.  Dose was increased during her hospital stay. Will need to arrange FU Lipids and LFTs at next OV.  4. AAA - FU with [facility].    Medication Adjustments/Labs and Tests Ordered: Current medicines are reviewed at length with the patient today.  Concerns regarding medicines are outlined above.  Medication changes, Labs and Tests ordered today are outlined in the Patient Instructions noted below. Patient Instructions  Medication Instructions:  1. INCREASE ATENOLOL TO 25 MG TWICE DAILY; NEW RX HAS BEEN SENT IN 2. START PEPCID 20 MG AT BEDTIME; RX HAS BEEN SENT IN 3. RX FOR NTG HAS BEEN SENT IN  Labwork: NONE  Testing/Procedures: NONE  Follow-Up: DR. Burt Knack ON 02/08/16 @ 10 AM  Any Other Special Instructions Will Be Listed Below (If Applicable).  If you need a refill on your cardiac medications before your next appointment, please call your pharmacy.  Signed, Richardson Dopp, PA-C  11/09/2015 9:43 PM    [facility] [place], [place],   [address] Phone: [phone]; Fax: [phone]

## 2015-11-10 ENCOUNTER — Ambulatory Visit: Payer: Self-pay | Admitting: Gynecology

## 2015-11-12 ENCOUNTER — Ambulatory Visit (INDEPENDENT_AMBULATORY_CARE_PROVIDER_SITE_OTHER): Payer: Medicare Other | Admitting: Internal Medicine

## 2015-11-12 ENCOUNTER — Encounter: Payer: Self-pay | Admitting: Internal Medicine

## 2015-11-12 VITALS — BP 128/82 | HR 75 | Temp 98.3°F | Wt 224.0 lb

## 2015-11-12 DIAGNOSIS — R1031 Right lower quadrant pain: Secondary | ICD-10-CM

## 2015-11-12 DIAGNOSIS — I200 Unstable angina: Secondary | ICD-10-CM | POA: Diagnosis not present

## 2015-11-12 DIAGNOSIS — E11311 Type 2 diabetes mellitus with unspecified diabetic retinopathy with macular edema: Secondary | ICD-10-CM

## 2015-11-12 DIAGNOSIS — J309 Allergic rhinitis, unspecified: Secondary | ICD-10-CM

## 2015-11-12 DIAGNOSIS — M5416 Radiculopathy, lumbar region: Secondary | ICD-10-CM

## 2015-11-12 LAB — POC URINALSYSI DIPSTICK (AUTOMATED)
BILIRUBIN UA: NEGATIVE
Blood, UA: NEGATIVE
GLUCOSE UA: NEGATIVE
KETONES UA: NEGATIVE
LEUKOCYTES UA: NEGATIVE
NITRITE UA: NEGATIVE
PH UA: 6
Protein, UA: NEGATIVE
Spec Grav, UA: 1.015
Urobilinogen, UA: 1

## 2015-11-12 MED ORDER — METHYLPREDNISOLONE ACETATE 80 MG/ML IJ SUSP
80.0000 mg | Freq: Once | INTRAMUSCULAR | Status: AC
  Administered 2015-11-12: 80 mg via INTRAMUSCULAR

## 2015-11-12 MED ORDER — KETOROLAC TROMETHAMINE 60 MG/2ML IM SOLN
30.0000 mg | Freq: Once | INTRAMUSCULAR | Status: AC
  Administered 2015-11-12: 30 mg via INTRAMUSCULAR

## 2015-11-12 MED ORDER — GABAPENTIN 300 MG PO CAPS: ORAL_CAPSULE | ORAL | 1 refills | Status: DC

## 2015-11-12 NOTE — Patient Instructions (Addendum)
You had the steroid shot today, and the toradol for pain  OK to increase the gabapentin to 1-2 tabs by mouth at night for pain (sent to [facility])  Your specimen was OK today, and there appears to be no infection  Please continue all other medications as before, and refills have been done if requested.  Please have the pharmacy call with any other refills you may need.  Please keep your appointments with your specialists as you may have planned

## 2015-11-12 NOTE — Assessment & Plan Note (Addendum)
Near resolved, suspect related to right hip DJD, no further eval or tx needed, Udip reviewed - neg

## 2015-11-12 NOTE — Assessment & Plan Note (Signed)
With recurent pain worsening today, for toradol 30 mg, increase gabapentin to 600 qhs (and encouraged compliance)

## 2015-11-12 NOTE — Progress Notes (Signed)
Pre visit review using our clinic review tool, if applicable. No additional management support is needed unless otherwise documented below in the visit note. 

## 2015-11-12 NOTE — Progress Notes (Signed)
Subjective:    Patient ID: [name], female    DOB: 1945/08/23, 70 y.o.   MRN: [id number]  HPI  Here to f/u, recent > 1 wk right groin pain has markedly improved to 1/10 today, sharp, intermittent, worse to flex the hip a the hip, nothing makes better except sitting more still; s/p appy, Denies urinary symptoms such as dysuria, frequency, urgency, flank pain, hematuria or n/v, fever, chills.  Udip in office reviewed today  - neg.  Denies worsening reflux, abd pain, dysphagia, n/v, bowel change or blood. Does incidentally have flare of right radicular pain, 6/10, lancinating, constant, worse to lie down with radiation to buttock but not leg and no LE weakness or numbness, is s/p lumbar fusion l3-s1, has seen Dr Tamala Julian recently as well.  Pt has been noncompliant with gabapentin but willing to try now.  Also - Bilat Tm's with mild erythema, nonbulging, sinus nontender, pharynx with mild erythema and cobblestoning appearance without exudate  Pt denies polydipsia, polyuria Past Medical History:  Diagnosis Date  . AAA (abdominal aortic aneurysm) ([place])    a. 4.7cm in 08/2012. Followed by [facility].  . Allergic rhinitis, cause unspecified   . Anxiety state, unspecified   . Chronic LBP   . Coronary artery disease    a. s/p inferior MI 1998->PCI of RCA. b. S/p prior PCI of the OM1 and LAD;  c. 05/2013 Cath/PCI: LM nl, LAD 90p (3.0x22 Resolute DES), 70m w/ patent stent, Diag 40p, 27m, LCX patent OM1 stent, RCA 50p, 2m, EF 65%. d. 10/12 PCI with DESx3 to RCA  . Degeneration of lumbar or lumbosacral intervertebral disc   . Depressive disorder, not elsewhere classified   . Diabetes mellitus    TYPE II  . Gout 08/22/2013  . Hyperlipidemia   . Hypertension   . Hypothyroidism   . Lower GI bleed 06/2010   Diverticular bleed  . Noncompliance with medications 02/26/2014  . Obesity, unspecified    Past Surgical History:  Procedure Laterality Date  . CARDIAC CATHETERIZATION     PCI OF BOTH THE CIRCUMFLEX AND LEFT  ANTERIOR DESCENDING ARTERY  . CARDIAC CATHETERIZATION N/A 10/29/2015   Procedure: Coronary Stent Intervention;  Surgeon: Nelva Bush, MD;  Location: [place] [facility];  Service: Cardiovascular;  Laterality: N/A;  . CARDIAC CATHETERIZATION N/A 10/29/2015   Procedure: Coronary/Graft Angiography;  Surgeon: Nelva Bush, MD;  Location: [place] [facility];  Service: Cardiovascular;  Laterality: N/A;  . CARDIAC CATHETERIZATION N/A 10/29/2015   Procedure: Intravascular Pressure Wire/FFR Study;  Surgeon: Nelva Bush, MD;  Location: [place] [facility];  Service: Cardiovascular;  Laterality: N/A;  . CESAREAN SECTION    . HEART STENT  04-2010  and  Jun 07, 2013   X 3  . LEFT HEART CATHETERIZATION WITH CORONARY ANGIOGRAM N/A 06/07/2013   Procedure: LEFT HEART CATHETERIZATION WITH CORONARY ANGIOGRAM;  Surgeon: [name], MD;  Location: [facility] C[facility] LAB;  Service: Cardiovascular;  Laterality: N/A;  . LEFT HEART CATHETERIZATION WITH CORONARY ANGIOGRAM N/A 02/25/2014   Procedure: LEFT HEART CATHETERIZATION WITH CORONARY ANGIOGRAM;  Surgeon: Troy Sine, MD;  Location: [facility] CATH LAB;  Service: Cardiovascular;  Laterality: N/A;  . LUMBAR FUSION  01/2007   DR. Patrice Paradise...3-LEVEL WITH FIXATION  . PARATHYROIDECTOMY    . SPINE SURGERY    . THYROIDECTOMY    . TOTAL ABDOMINAL HYSTERECTOMY      reports that she quit smoking about 29 years ago. Her smoking use included Cigarettes. She quit after  30.00 years of use. She has never used smokeless tobacco. She reports that she does not drink alcohol or use drugs. family history includes Arthritis in her maternal aunt; Cancer in her maternal aunt; Diabetes in her brother and maternal aunt; Heart attack (age of onset: 4) in her mother; Heart attack (age of onset: 42) in her father; Heart disease in her father and mother. Allergies  Allergen Reactions  . Prilosec [Omeprazole] Other (See Comments)    Chest pain  . Miconazole Nitrate Hives    REACTION: hives    . Augmentin [Amoxicillin-Pot Clavulanate] Hives, Itching and Rash  . Doxycycline Other (See Comments)    REACTION: gi upset   Current Outpatient Prescriptions on File Prior to Visit  Medication Sig Dispense Refill  . acetaminophen (TYLENOL) 325 MG tablet Take 2 tablets (650 mg total) by mouth every 4 (four) hours as needed for headache or mild pain.    [name] allopurinol (ZYLOPRIM) 300 MG tablet Take 1 tablet (300 mg total) by mouth daily. 30 tablet 6  . aspirin 81 MG EC tablet Take 81 mg by mouth daily.     [name] atenolol (TENORMIN) 25 MG tablet Take 1 tablet (25 mg total) by mouth 2 (two) times daily. 180 tablet 3  . famotidine (PEPCID) 20 MG tablet Take 1 tablet (20 mg total) by mouth daily. 30 tablet 6  . metFORMIN (GLUCOPHAGE-XR) 500 MG 24 hr tablet TAKE 4 TABLETS BY MOUTH EVERY MORNING 360 tablet 0  . nitroGLYCERIN (NITROSTAT) 0.4 MG SL tablet Place 1 tablet (0.4 mg total) under the tongue every 5 (five) minutes as needed for chest pain. 25 tablet 3  . rosuvastatin (CRESTOR) 20 MG tablet Take 1 tablet (20 mg total) by mouth daily. 30 tablet 6  . SYNTHROID 150 MCG tablet TAKE 1 TABLET BY MOUTH EVERY DAY 90 tablet 1  . ticagrelor (BRILINTA) 90 MG TABS tablet Take 1 tablet (90 mg total) by mouth 2 (two) times daily. 60 tablet 11  . traMADol (ULTRAM) 50 MG tablet Take 50 mg by mouth every 6 (six) hours as needed for moderate pain.    . Vitamin D, Ergocalciferol, (DRISDOL) 50000 units CAPS capsule Take 1 capsule (50,000 Units total) by mouth every 7 (seven) days. 12 capsule 0   No current facility-administered medications on file prior to visit.    Review of Systems  Constitutional: Negative for unusual diaphoresis or night sweats HENT: Negative for ear swelling or discharge Eyes: Negative for worsening visual haziness  Respiratory: Negative for choking and stridor.   Gastrointestinal: Negative for distension or worsening eructation Genitourinary: Negative for retention or change in urine  volume.  Musculoskeletal: Negative for other MSK pain or swelling Skin: Negative for color change and worsening wound Neurological: Negative for tremors and numbness other than noted  Psychiatric/Behavioral: Negative for decreased concentration or agitation other than above   All other symptoms neg    Objective:   Physical Exam BP 128/82   Pulse 75   Temp 98.3 F (36.8 C)   Wt 224 lb (101.6 kg)   SpO2 98%   BMI 36.15 kg/m  VS noted, obese, nontoxic Constitutional: Pt appears in no apparent distress HENT: Head: NCAT.  Right Ear: External ear normal.  Left Ear: External ear normal.  Eyes: . Pupils are equal, round, and reactive to light. Conjunctivae and EOM are normal Neck: Normal range of motion. Neck supple.  Cardiovascular: Normal rate and regular rhythm.   Pulmonary/Chest: Effort normal and breath sounds without rales  or wheezing.  Abd:  Soft, NT, ND, + BS, no flank tender Spine mild tender lowest lumbar but moderate tender to right lateral l5 paravertebral without muscular spasm or rash No right lateral hip tender Right hip with mild discomfort only to int/ext rotation, exam actually markedly exac the right low back pain and pt had to stop to sit up Neurological: Pt is alert. Not confused , motor 5/5 intact, sens/dtr intact Skin: Skin is warm. No rash, no LE edema Psychiatric: Pt behavior is normal. No agitation. mild nervous  POCT Urinalysis Dipstick (Automated)  Order: [id number]  Status:  Final result Visible to patient:  No (Not Released) Dx:  Right groin pain    Ref Range & Units 18:13 69mo ago 57mo ago 42yr ago   Color, UA  yellow       Clarity, UA  clear       Glucose, UA  neg    NEGATIVE    Bilirubin, UA  neg       Ketones, UA  neg       Spec Grav, UA  1.015       Blood, UA  neg       pH, UA  6.0       Protein, UA  neg       Urobilinogen, UA  1.0  0.2  1.0  1.0    Nitrite, UA  neg       Leukocytes, UA Negative Negative           DG HIP (WITH OR  WITHOUT PELVIS) 3-4V BILAT 03/23/2015 IMPRESSION: Degenerative changes both hips. Postsurgical changes lumbar spine. No acute abnormality .    Assessment & Plan:

## 2015-11-12 NOTE — Assessment & Plan Note (Signed)
stable overall by history and exam, recent data reviewed with pt, and pt to continue medical treatment as before,  to f/u any worsening symptoms or concerns Lab Results  Component Value Date   HGBA1C 6.6 (H) 03/27/2015   Pt to call for onset poly or cbg > 200 with steroid tx today

## 2015-11-12 NOTE — Assessment & Plan Note (Signed)
Mild to mod incidental seasonal flare, for depomedrol IM 80, ,  to f/u any worsening symptoms or concerns

## 2015-11-19 ENCOUNTER — Ambulatory Visit (INDEPENDENT_AMBULATORY_CARE_PROVIDER_SITE_OTHER): Payer: Medicare Other | Admitting: Gynecology

## 2015-11-19 ENCOUNTER — Encounter: Payer: Self-pay | Admitting: Gynecology

## 2015-11-19 VITALS — BP 118/76 | Ht 65.0 in | Wt 223.0 lb

## 2015-11-19 DIAGNOSIS — I200 Unstable angina: Secondary | ICD-10-CM

## 2015-11-19 DIAGNOSIS — N952 Postmenopausal atrophic vaginitis: Secondary | ICD-10-CM | POA: Diagnosis not present

## 2015-11-19 DIAGNOSIS — R109 Unspecified abdominal pain: Secondary | ICD-10-CM

## 2015-11-19 DIAGNOSIS — Z1272 Encounter for screening for malignant neoplasm of vagina: Secondary | ICD-10-CM | POA: Diagnosis not present

## 2015-11-19 NOTE — Patient Instructions (Signed)
Follow up with Dr. [name] for ongoing workup of your pain.   Call to Schedule your mammogram  Facilities in [place]: 1)  [facility]. [place] [address] Phone: [phone] 2)  Dr. Isaiah Blakes at [facility] [address] Phone: [phone]     Mammogram A mammogram is an X-ray test to find changes in a woman's breast. You should get a mammogram if:  You are 35 years of age or older  You have risk factors.   Your doctor recommends that you have one.  BEFORE THE TEST  Do not schedule the test the week before your period, especially if your breasts are sore during this time.  On the day of your mammogram:  Wash your breasts and armpits well. After washing, do not put on any deodorant or talcum powder on until after your test.   Eat and drink as you usually do.   Take your medicines as usual.   If you are diabetic and take insulin, make sure you:   Eat before coming for your test.   Take your insulin as usual.   If you cannot keep your appointment, call before the appointment to cancel. Schedule another appointment.  TEST  You will need to undress from the waist up. You will put on a hospital gown.   Your breast will be put on the mammogram machine, and it will press firmly on your breast with a piece of plastic called a compression paddle. This will make your breast flatter so that the machine can X-ray all parts of your breast.   Both breasts will be X-rayed. Each breast will be X-rayed from above and from the side. An X-ray might need to be taken again if the picture is not good enough.   The mammogram will last about 15 to 30 minutes.  AFTER THE TEST Finding out the results of your test Ask when your test results will be ready. Make sure you get your test results.  Document Released: 04/01/2008 Document Revised: 12/23/2010 Document Reviewed: 04/01/2008 [facility] Patient Information 2012  [place].

## 2015-11-19 NOTE — Progress Notes (Signed)
    [name] 01-03-1946 [id number]        70 y.o.  G3P0013 new patient presents complaining of months of right flank pain.  Aching to sharp stabbing. Worse at night when laying flat.  Currently being evaluated by [name]. Has not had a GYN exam and a number of years and was referred for examination. She does have a history of TAH with she thinks BSO although not absolutely sure years ago for bleeding. Has had no GYN issues in the past. No history of abnormal Pap smears.  is not having nausea vomiting diarrhea constipation. No frequency dysuria or urgency. Does have a past history of lumbar radiculopathy. Also has a 5 cm infra renal aortic aneurysm.  Recent evaluation shows a negative urine analysis without evidence of blood or other abnormalities. CT scan consistent with history of hysterectomy with no pelvic masses or pathology noted. No adenopathy. Does have some diverticulosis but no diverticulitis. Also shows evidence of umbilical hernia with  Past medical history,surgical history, problem list, medications, allergies, family history and social history were all reviewed and documented as reviewed in the EPIC chart.  ROS:  Performed with pertinent positives and negatives included in the history, assessment and plan.   Additional significant findings :  none   Exam: [name] assistant Vitals:   11/19/15 1432  BP: 118/76  Weight: 223 lb (101.2 kg)  Height: 5\' 5"  (1.651 m)   Body mass index is 37.11 kg/m.  General appearance:  Normal affect, orientation and appearance. Skin: Grossly normal HEENT: Without gross lesions.  No cervical or supraclavicular adenopathy. Thyroid normal.  Lungs:  Clear without wheezing, rales or rhonchi Cardiac: RR, without RMG Abdominal:  Soft, nontender, without masses, guarding, rebound, organomegaly or hernia Breasts:  Examined lying and sitting without masses, retractions, discharge or axillary adenopathy. Pelvic:  Ext, BUS, Vagina with atrophic  changes. Pap smear of cuff done  Adnexa without masses or tenderness    Anus and perineum normal   Rectovaginal normal sphincter tone without palpated masses or tenderness.    Assessment/Plan:  70 y.o. G64P0013 female with months history of right flank pain. Worse at night when laying flat. No associated urinary or GI symptoms. Exam overtly normal. urine analysis is negative. CT scan without overt pathology explaining her pain.  I suspect this may be related to her radiculopathy/musculoskeletal in origin. No evidence of GYN pathology noting history of TAH with probable BSO. CT scan shows negative pelvis without adenopathy. Do not feel any further workup needed from a GYN standpoint. I did review with her the aneurysm and whether it would be related to causing the pain, although somewhat atypical in presentation. She'll further discuss with her other physicians who were following her for this.  I did do a Pap smear for completeness. She is overdue for mammography and names and numbers were provided for her to call and schedule this. self breast exams monthly reviewed.She is up-to-date with her colonoscopy in 2012.    Brandi Auerbach MD, 3:09 PM 11/19/2015

## 2015-11-19 NOTE — Addendum Note (Signed)
Addended by: Nelva Nay on: 11/19/2015 03:42 PM   Modules accepted: Orders

## 2015-11-23 ENCOUNTER — Ambulatory Visit: Payer: Medicare Other | Admitting: Internal Medicine

## 2015-11-23 LAB — PAP IG W/ RFLX HPV ASCU

## 2015-11-25 ENCOUNTER — Encounter: Payer: Self-pay | Admitting: Family

## 2015-11-25 ENCOUNTER — Ambulatory Visit (INDEPENDENT_AMBULATORY_CARE_PROVIDER_SITE_OTHER): Payer: Medicare Other | Admitting: Family

## 2015-11-25 ENCOUNTER — Ambulatory Visit: Payer: Medicare Other | Admitting: Internal Medicine

## 2015-11-25 VITALS — BP 116/72 | HR 83 | Temp 97.5°F | Resp 18 | Ht 65.0 in | Wt 221.0 lb

## 2015-11-25 DIAGNOSIS — I200 Unstable angina: Secondary | ICD-10-CM | POA: Diagnosis not present

## 2015-11-25 DIAGNOSIS — M5416 Radiculopathy, lumbar region: Secondary | ICD-10-CM | POA: Diagnosis not present

## 2015-11-25 MED ORDER — KETOROLAC TROMETHAMINE 60 MG/2ML IM SOLN
60.0000 mg | Freq: Once | INTRAMUSCULAR | Status: AC
  Administered 2015-11-25: 60 mg via INTRAMUSCULAR

## 2015-11-25 NOTE — Assessment & Plan Note (Signed)
Continues to experience lumbar back pain and radiculopathy consistent with degenerative disc disease that has failed conservative treatment. Recommend at home therapy, ice and moist heat. In office injection of Toradol provided. This is unlikely renal or aneurysm pain based on presentation and previous imaging. Encouraged to consider CT myelogram and possible chronic pain management if the symptoms worsen. Continue to monitor.

## 2015-11-25 NOTE — Progress Notes (Signed)
Subjective:    Patient ID: [name], female    DOB: Feb 18, 1945, 70 y.o.   MRN: [id number]  Chief Complaint  Patient presents with  . Back Pain    lower back pain and right side pain    HPI:  [name] is a 70 y.o. female who  has a past medical history of AAA (abdominal aortic aneurysm) ([place]); Allergic rhinitis, cause unspecified; Anxiety state, unspecified; Chronic LBP; Coronary artery disease; Degeneration of lumbar or lumbosacral intervertebral disc; Depressive disorder, not elsewhere classified; Diabetes mellitus; Gout (08/22/2013); Hyperlipidemia; Hypertension; Hypothyroidism; Lower GI bleed (06/2010); Noncompliance with medications (02/26/2014); and Obesity, unspecified. and presents today for an office visit.  Back pain - Continues to experience the associated symptom of pain located in her lower back and right side. Severity of the pain is 5/10. Modifying factors include Toradol which has helped with the pain in the past. Pain is described as achy and burning. Described as just "hurting." There is radiculopathy located on the right side. Not currently doing other non-pharmacological therapy. She has previously been noted to have progression of her L2-L3 disc disease and previous history of back surgeries including lumbar fusion.  Previously evaluated by Dr. Tamala Julian the potential recommendation for a CT myelogram and referral for possible epidural if appropriate. Continues to indicate that she is not interested in surgical intervention.   Allergies  Allergen Reactions  . Prilosec [Omeprazole] Other (See Comments)    Chest pain  . Miconazole Nitrate Hives    REACTION: hives  . Augmentin [Amoxicillin-Pot Clavulanate] Hives, Itching and Rash  . Doxycycline Other (See Comments)    REACTION: gi upset      Outpatient Medications Prior to Visit  Medication Sig Dispense Refill  . acetaminophen (TYLENOL) 325 MG tablet Take 2 tablets (650 mg total) by mouth every 4 (four) hours as  needed for headache or mild pain.    [name] allopurinol (ZYLOPRIM) 300 MG tablet Take 1 tablet (300 mg total) by mouth daily. 30 tablet 6  . aspirin 81 MG EC tablet Take 81 mg by mouth daily.     [name] atenolol (TENORMIN) 25 MG tablet Take 1 tablet (25 mg total) by mouth 2 (two) times daily. 180 tablet 3  . famotidine (PEPCID) 20 MG tablet Take 1 tablet (20 mg total) by mouth daily. 30 tablet 6  . gabapentin (NEURONTIN) 300 MG capsule 1-2 tab by mouth at bedtime 180 capsule 1  . metFORMIN (GLUCOPHAGE-XR) 500 MG 24 hr tablet TAKE 4 TABLETS BY MOUTH EVERY MORNING 360 tablet 0  . nitroGLYCERIN (NITROSTAT) 0.4 MG SL tablet Place 1 tablet (0.4 mg total) under the tongue every 5 (five) minutes as needed for chest pain. 25 tablet 3  . rosuvastatin (CRESTOR) 20 MG tablet Take 1 tablet (20 mg total) by mouth daily. 30 tablet 6  . SYNTHROID 150 MCG tablet TAKE 1 TABLET BY MOUTH EVERY DAY 90 tablet 1  . ticagrelor (BRILINTA) 90 MG TABS tablet Take 1 tablet (90 mg total) by mouth 2 (two) times daily. 60 tablet 11  . traMADol (ULTRAM) 50 MG tablet Take 50 mg by mouth every 6 (six) hours as needed for moderate pain.    . Vitamin D, Ergocalciferol, (DRISDOL) 50000 units CAPS capsule Take 1 capsule (50,000 Units total) by mouth every 7 (seven) days. 12 capsule 0   No facility-administered medications prior to visit.       Past Surgical History:  Procedure Laterality Date  . CARDIAC CATHETERIZATION  PCI OF BOTH THE CIRCUMFLEX AND LEFT ANTERIOR DESCENDING ARTERY  . CARDIAC CATHETERIZATION N/A 10/29/2015   Procedure: Coronary Stent Intervention;  Surgeon: Nelva Bush, MD;  Location: [place] [facility];  Service: Cardiovascular;  Laterality: N/A;  . CARDIAC CATHETERIZATION N/A 10/29/2015   Procedure: Coronary/Graft Angiography;  Surgeon: Nelva Bush, MD;  Location: [place] [facility];  Service: Cardiovascular;  Laterality: N/A;  . CARDIAC CATHETERIZATION N/A 10/29/2015   Procedure: Intravascular Pressure  Wire/FFR Study;  Surgeon: Nelva Bush, MD;  Location: [place] [facility];  Service: Cardiovascular;  Laterality: N/A;  . CESAREAN SECTION    . HEART STENT  04-2010  and  Jun 07, 2013   X 3  . LEFT HEART CATHETERIZATION WITH CORONARY ANGIOGRAM N/A 06/07/2013   Procedure: LEFT HEART CATHETERIZATION WITH CORONARY ANGIOGRAM;  Surgeon: [name], MD;  Location[facility] C[facility] LAB;  Service: Cardiovascular;  Laterality: N/A;  . LEFT HEART CATHETERIZATION WITH CORONARY ANGIOGRAM N/A 02/25/2014   Procedure: LEFT HEART CATHETERIZATION WITH CORONARY ANGIOGRAM;  Surgeon: Troy Sine, MD;  [facility];  Service: Cardiovascular;  Laterality: N/A;  . LUMBAR FUSION  01/2007   DR. Patrice Paradise...3-LEVEL WITH FIXATION  . OOPHORECTOMY     BSO? pt.unsure  . PARATHYROIDECTOMY    . SPINE SURGERY    . THYROIDECTOMY    . TOTAL ABDOMINAL HYSTERECTOMY        Past Medical History:  Diagnosis Date  . AAA (abdominal aortic aneurysm) ([place])    a. 4.7cm in 08/2012. Followed by [facility].  . Allergic rhinitis, cause unspecified   . Anxiety state, unspecified   . Chronic LBP   . Coronary artery disease    a. s/p inferior MI 1998->PCI of RCA. b. S/p prior PCI of the OM1 and LAD;  c. 05/2013 Cath/PCI: LM nl, LAD 90p (3.0x22 Resolute DES), 58m w/ patent stent, Diag 40p, 59m, LCX patent OM1 stent, RCA 50p, 42m, EF 65%. d. 10/12 PCI with DESx3 to RCA  . Degeneration of lumbar or lumbosacral intervertebral disc   . Depressive disorder, not elsewhere classified   . Diabetes mellitus    TYPE II  . Gout 08/22/2013  . Hyperlipidemia   . Hypertension   . Hypothyroidism   . Lower GI bleed 06/2010   Diverticular bleed  . Noncompliance with medications 02/26/2014  . Obesity, unspecified       Review of Systems  Constitutional: Negative for chills and fever.  Musculoskeletal: Positive for back pain.  Neurological: Negative for weakness and numbness.      Objective:    BP 116/72 (BP Location: Left Arm, Patient  Position: Sitting, Cuff Size: Large)   Pulse 83   Temp 97.5 F (36.4 C) (Oral)   Resp 18   Ht 5\' 5"  (1.651 m)   Wt 221 lb (100.2 kg)   SpO2 98%   BMI 36.78 kg/m  Nursing note and vital signs reviewed.  Physical Exam  Constitutional: She is oriented to person, place, and time. She appears well-developed and well-nourished. No distress.  Cardiovascular: Normal rate, regular rhythm, normal heart sounds and intact distal pulses.   Pulmonary/Chest: Effort normal and breath sounds normal.  Musculoskeletal:  Low back - Increased lordotic curve most likely from weight and weakened abdominal muscles. No deformity, discoloration or edema noted. Tenderness over right lumbar paraspinal musculature without crepitus or deformity. Flexion is normal. Extension and right lateral bending are limited secondary to pain. Distal pulses are intact and appropriate. Straight leg raise is negative. Fabers  test is negative.   Neurological: She is alert and oriented to person, place, and time.  Skin: Skin is warm and dry.  Psychiatric: She has a normal mood and affect. Her behavior is normal. Judgment and thought content normal.       Assessment & Plan:   Problem List Items Addressed This Visit      Nervous and Auditory   Lumbar radiculopathy - Primary    Continues to experience lumbar back pain and radiculopathy consistent with degenerative disc disease that has failed conservative treatment. Recommend at home therapy, ice and moist heat. In office injection of Toradol provided. This is unlikely renal or aneurysm pain based on presentation and previous imaging. Encouraged to consider CT myelogram and possible chronic pain management if the symptoms worsen. Continue to monitor.       Relevant Medications   ketorolac (TORADOL) injection 60 mg (Completed)       I am having Ms. Owens Shark maintain her acetaminophen, aspirin, metFORMIN, allopurinol, Vitamin D (Ergocalciferol), rosuvastatin, ticagrelor, SYNTHROID,  traMADol, atenolol, famotidine, nitroGLYCERIN, and gabapentin. We administered ketorolac.   Meds ordered this encounter  Medications  . ketorolac (TORADOL) injection 60 mg     Follow-up: Return if symptoms worsen or fail to improve.  Mauricio Po, FNP

## 2015-11-25 NOTE — Patient Instructions (Signed)
Thank you for choosing [facility].  SUMMARY AND INSTRUCTIONS:  Ice / moist heat x 20 minutes every 2 hours as needed and after activity and before bed.   Stretches and exercises daily.  Consider the CT scan and possible epidural if appropriate.   Medication:  Please continue to take your medications as prescribed.   Your prescription(s) have been submitted to your pharmacy or been printed and provided for you. Please take as directed and contact our office if you believe you are having problem(s) with the medication(s) or have any questions.   Follow up:  If your symptoms worsen or fail to improve, please contact our office for further instruction, or in case of emergency go directly to the emergency room at the closest medical facility.     Low Back Strain With Rehab A strain is an injury in which a tendon or muscle is torn. The muscles and tendons of the lower back are vulnerable to strains. However, these muscles and tendons are very strong and require a great force to be injured. Strains are classified into three categories. Grade 1 strains cause pain, but the tendon is not lengthened. Grade 2 strains include a lengthened ligament, due to the ligament being stretched or partially ruptured. With grade 2 strains there is still function, although the function may be decreased. Grade 3 strains involve a complete tear of the tendon or muscle, and function is usually impaired. SYMPTOMS   Pain in the lower back.  Pain that affects one side more than the other.  Pain that gets worse with movement and may be felt in the hip, buttocks, or back of the thigh.  Muscle spasms of the muscles in the back.  Swelling along the muscles of the back.  Loss of strength of the back muscles.  Crackling sound (crepitation) when the muscles are touched. CAUSES  Lower back strains occur when a force is placed on the muscles or tendons that is greater than they can handle. Common causes of  injury include:  Prolonged overuse of the muscle-tendon units in the lower back, usually from incorrect posture.  A single violent injury or force applied to the back. RISK INCREASES WITH:  Sports that involve twisting forces on the spine or a lot of bending at the waist (football, rugby, weightlifting, bowling, golf, tennis, speed skating, racquetball, swimming, running, gymnastics, diving).  Poor strength and flexibility.  Failure to warm up properly before activity.  Family history of lower back pain or disk disorders.  Previous back injury or surgery (especially fusion).  Poor posture with lifting, especially heavy objects.  Prolonged sitting, especially with poor posture. PREVENTION   Learn and use proper posture when sitting or lifting (maintain proper posture when sitting, lift using the knees and legs, not at the waist).  Warm up and stretch properly before activity.  Allow for adequate recovery between workouts.  Maintain physical fitness:  Strength, flexibility, and endurance.  Cardiovascular fitness. PROGNOSIS  If treated properly, lower back strains usually heal within 6 weeks. RELATED COMPLICATIONS   Recurring symptoms, resulting in a chronic problem.  Chronic inflammation, scarring, and partial muscle-tendon tear.  Delayed healing or resolution of symptoms.  Prolonged disability. TREATMENT  Treatment first involves the use of ice and medicine, to reduce pain and inflammation. The use of strengthening and stretching exercises may help reduce pain with activity. These exercises may be performed at home or with a therapist. Severe injuries may require referral to a therapist for further evaluation and  treatment, such as ultrasound. Your caregiver may advise that you wear a back brace or corset, to help reduce pain and discomfort. Often, prolonged bed rest results in greater harm then benefit. Corticosteroid injections may be recommended. However, these should  be reserved for the most serious cases. It is important to avoid using your back when lifting objects. At night, sleep on your back on a firm mattress with a pillow placed under your knees. If non-surgical treatment is unsuccessful, surgery may be needed.  MEDICATION   If pain medicine is needed, nonsteroidal anti-inflammatory medicines (aspirin and ibuprofen), or other minor pain relievers (acetaminophen), are often advised.  Do not take pain medicine for 7 days before surgery.  Prescription pain relievers may be given, if your caregiver thinks they are needed. Use only as directed and only as much as you need.  Ointments applied to the skin may be helpful.  Corticosteroid injections may be given by your caregiver. These injections should be reserved for the most serious cases, because they may only be given a certain number of times. HEAT AND COLD  Cold treatment (icing) should be applied for 10 to 15 minutes every 2 to 3 hours for inflammation and pain, and immediately after activity that aggravates your symptoms. Use ice packs or an ice massage.  Heat treatment may be used before performing stretching and strengthening activities prescribed by your caregiver, physical therapist, or athletic trainer. Use a heat pack or a warm water soak. SEEK MEDICAL CARE IF:   Symptoms get worse or do not improve in 2 to 4 weeks, despite treatment.  You develop numbness, weakness, or loss of bowel or bladder function.  New, unexplained symptoms develop. (Drugs used in treatment may produce side effects.) EXERCISES  RANGE OF MOTION (ROM) AND STRETCHING EXERCISES - Low Back Strain Most people with lower back pain will find that their symptoms get worse with excessive bending forward (flexion) or arching at the lower back (extension). The exercises which will help resolve your symptoms will focus on the opposite motion.  Your physician, physical therapist or athletic trainer will help you determine which  exercises will be most helpful to resolve your lower back pain. Do not complete any exercises without first consulting with your caregiver. Discontinue any exercises which make your symptoms worse until you speak to your caregiver.  If you have pain, numbness or tingling which travels down into your buttocks, leg or foot, the goal of the therapy is for these symptoms to move closer to your back and eventually resolve. Sometimes, these leg symptoms will get better, but your lower back pain may worsen. This is typically an indication of progress in your rehabilitation. Be very alert to any changes in your symptoms and the activities in which you participated in the 24 hours prior to the change. Sharing this information with your caregiver will allow him/her to most efficiently treat your condition.  These exercises may help you when beginning to rehabilitate your injury. Your symptoms may resolve with or without further involvement from your physician, physical therapist or athletic trainer. While completing these exercises, remember:  Restoring tissue flexibility helps normal motion to return to the joints. This allows healthier, less painful movement and activity.  An effective stretch should be held for at least 30 seconds.  A stretch should never be painful. You should only feel a gentle lengthening or release in the stretched tissue. FLEXION RANGE OF MOTION AND STRETCHING EXERCISES: STRETCH - Flexion, Single Knee to Chest  Lie on a firm bed or floor with both legs extended in front of you.  Keeping one leg in contact with the floor, bring your opposite knee to your chest. Hold your leg in place by either grabbing behind your thigh or at your knee.  Pull until you feel a gentle stretch in your lower back. Hold __________ seconds.  Slowly release your grasp and repeat the exercise with the opposite side. Repeat __________ times. Complete this exercise __________ times per day.  STRETCH -  Flexion, Double Knee to Chest   Lie on a firm bed or floor with both legs extended in front of you.  Keeping one leg in contact with the floor, bring your opposite knee to your chest.  Tense your stomach muscles to support your back and then lift your other knee to your chest. Hold your legs in place by either grabbing behind your thighs or at your knees.  Pull both knees toward your chest until you feel a gentle stretch in your lower back. Hold __________ seconds.  Tense your stomach muscles and slowly return one leg at a time to the floor. Repeat __________ times. Complete this exercise __________ times per day.  STRETCH - Low Trunk Rotation  Lie on a firm bed or floor. Keeping your legs in front of you, bend your knees so they are both pointed toward the ceiling and your feet are flat on the floor.  Extend your arms out to the side. This will stabilize your upper body by keeping your shoulders in contact with the floor.  Gently and slowly drop both knees together to one side until you feel a gentle stretch in your lower back. Hold for __________ seconds.  Tense your stomach muscles to support your lower back as you bring your knees back to the starting position. Repeat the exercise to the other side. Repeat __________ times. Complete this exercise __________ times per day  EXTENSION RANGE OF MOTION AND FLEXIBILITY EXERCISES: STRETCH - Extension, Prone on Elbows   Lie on your stomach on the floor, a bed will be too soft. Place your palms about shoulder width apart and at the height of your head.  Place your elbows under your shoulders. If this is too painful, stack pillows under your chest.  Allow your body to relax so that your hips drop lower and make contact more completely with the floor.  Hold this position for __________ seconds.  Slowly return to lying flat on the floor. Repeat __________ times. Complete this exercise __________ times per day.  RANGE OF MOTION - Extension,  Prone Press Ups  Lie on your stomach on the floor, a bed will be too soft. Place your palms about shoulder width apart and at the height of your head.  Keeping your back as relaxed as possible, slowly straighten your elbows while keeping your hips on the floor. You may adjust the placement of your hands to maximize your comfort. As you gain motion, your hands will come more underneath your shoulders.  Hold this position __________ seconds.  Slowly return to lying flat on the floor. Repeat __________ times. Complete this exercise __________ times per day.  RANGE OF MOTION- Quadruped, Neutral Spine   Assume a hands and knees position on a firm surface. Keep your hands under your shoulders and your knees under your hips. You may place padding under your knees for comfort.  Drop your head and point your tail bone toward the ground below you. This will round out  your lower back like an angry cat. Hold this position for __________ seconds.  Slowly lift your head and release your tail bone so that your back sags into a large arch, like an old horse.  Hold this position for __________ seconds.  Repeat this until you feel limber in your lower back.  Now, find your "sweet spot." This will be the most comfortable position somewhere between the two previous positions. This is your neutral spine. Once you have found this position, tense your stomach muscles to support your lower back.  Hold this position for __________ seconds. Repeat __________ times. Complete this exercise __________ times per day.  STRENGTHENING EXERCISES - Low Back Strain These exercises may help you when beginning to rehabilitate your injury. These exercises should be done near your "sweet spot." This is the neutral, low-back arch, somewhere between fully rounded and fully arched, that is your least painful position. When performed in this safe range of motion, these exercises can be used for people who have either a flexion or  extension based injury. These exercises may resolve your symptoms with or without further involvement from your physician, physical therapist or athletic trainer. While completing these exercises, remember:   Muscles can gain both the endurance and the strength needed for everyday activities through controlled exercises.  Complete these exercises as instructed by your physician, physical therapist or athletic trainer. Increase the resistance and repetitions only as guided.  You may experience muscle soreness or fatigue, but the pain or discomfort you are trying to eliminate should never worsen during these exercises. If this pain does worsen, stop and make certain you are following the directions exactly. If the pain is still present after adjustments, discontinue the exercise until you can discuss the trouble with your caregiver. STRENGTHENING - Deep Abdominals, Pelvic Tilt  Lie on a firm bed or floor. Keeping your legs in front of you, bend your knees so they are both pointed toward the ceiling and your feet are flat on the floor.  Tense your lower abdominal muscles to press your lower back into the floor. This motion will rotate your pelvis so that your tail bone is scooping upwards rather than pointing at your feet or into the floor.  With a gentle tension and even breathing, hold this position for __________ seconds. Repeat __________ times. Complete this exercise __________ times per day.  STRENGTHENING - Abdominals, Crunches   Lie on a firm bed or floor. Keeping your legs in front of you, bend your knees so they are both pointed toward the ceiling and your feet are flat on the floor. Cross your arms over your chest.  Slightly tip your chin down without bending your neck.  Tense your abdominals and slowly lift your trunk high enough to just clear your shoulder blades. Lifting higher can put excessive stress on the lower back and does not further strengthen your abdominal  muscles.  Control your return to the starting position. Repeat __________ times. Complete this exercise __________ times per day.  STRENGTHENING - Quadruped, Opposite UE/LE Lift   Assume a hands and knees position on a firm surface. Keep your hands under your shoulders and your knees under your hips. You may place padding under your knees for comfort.  Find your neutral spine and gently tense your abdominal muscles so that you can maintain this position. Your shoulders and hips should form a rectangle that is parallel with the floor and is not twisted.  Keeping your trunk steady, lift your right  hand no higher than your shoulder and then your left leg no higher than your hip. Make sure you are not holding your breath. Hold this position __________ seconds.  Continuing to keep your abdominal muscles tense and your back steady, slowly return to your starting position. Repeat with the opposite arm and leg. Repeat __________ times. Complete this exercise __________ times per day.  STRENGTHENING - Lower Abdominals, Double Knee Lift  Lie on a firm bed or floor. Keeping your legs in front of you, bend your knees so they are both pointed toward the ceiling and your feet are flat on the floor.  Tense your abdominal muscles to brace your lower back and slowly lift both of your knees until they come over your hips. Be certain not to hold your breath.  Hold __________ seconds. Using your abdominal muscles, return to the starting position in a slow and controlled manner. Repeat __________ times. Complete this exercise __________ times per day.  POSTURE AND BODY MECHANICS CONSIDERATIONS - Low Back Strain Keeping correct posture when sitting, standing or completing your activities will reduce the stress put on different body tissues, allowing injured tissues a chance to heal and limiting painful experiences. The following are general guidelines for improved posture. Your physician or physical therapist will  provide you with any instructions specific to your needs. While reading these guidelines, remember:  The exercises prescribed by your provider will help you have the flexibility and strength to maintain correct postures.  The correct posture provides the best environment for your joints to work. All of your joints have less wear and tear when properly supported by a spine with good posture. This means you will experience a healthier, less painful body.  Correct posture must be practiced with all of your activities, especially prolonged sitting and standing. Correct posture is as important when doing repetitive low-stress activities (typing) as it is when doing a single heavy-load activity (lifting). RESTING POSITIONS Consider which positions are most painful for you when choosing a resting position. If you have pain with flexion-based activities (sitting, bending, stooping, squatting), choose a position that allows you to rest in a less flexed posture. You would want to avoid curling into a fetal position on your side. If your pain worsens with extension-based activities (prolonged standing, working overhead), avoid resting in an extended position such as sleeping on your stomach. Most people will find more comfort when they rest with their spine in a more neutral position, neither too rounded nor too arched. Lying on a non-sagging bed on your side with a pillow between your knees, or on your back with a pillow under your knees will often provide some relief. Keep in mind, being in any one position for a prolonged period of time, no matter how correct your posture, can still lead to stiffness. PROPER SITTING POSTURE In order to minimize stress and discomfort on your spine, you must sit with correct posture. Sitting with good posture should be effortless for a healthy body. Returning to good posture is a gradual process. Many people can work toward this most comfortably by using various supports until they  have the flexibility and strength to maintain this posture on their own. When sitting with proper posture, your ears will fall over your shoulders and your shoulders will fall over your hips. You should use the back of the chair to support your upper back. Your lower back will be in a neutral position, just slightly arched. You may place a small pillow or  folded towel at the base of your lower back for support.  When working at a desk, create an environment that supports good, upright posture. Without extra support, muscles tire, which leads to excessive strain on joints and other tissues. Keep these recommendations in mind: CHAIR:  A chair should be able to slide under your desk when your back makes contact with the back of the chair. This allows you to work closely.  The chair's height should allow your eyes to be level with the upper part of your monitor and your hands to be slightly lower than your elbows. BODY POSITION  Your feet should make contact with the floor. If this is not possible, use a foot rest.  Keep your ears over your shoulders. This will reduce stress on your neck and lower back. INCORRECT SITTING POSTURES  If you are feeling tired and unable to assume a healthy sitting posture, do not slouch or slump. This puts excessive strain on your back tissues, causing more damage and pain. Healthier options include:  Using more support, like a lumbar pillow.  Switching tasks to something that requires you to be upright or walking.  Talking a brief walk.  Lying down to rest in a neutral-spine position. PROLONGED STANDING WHILE SLIGHTLY LEANING FORWARD  When completing a task that requires you to lean forward while standing in one place for a long time, place either foot up on a stationary 2-4 inch high object to help maintain the best posture. When both feet are on the ground, the lower back tends to lose its slight inward curve. If this curve flattens (or becomes too large), then  the back and your other joints will experience too much stress, tire more quickly, and can cause pain. CORRECT STANDING POSTURES Proper standing posture should be assumed with all daily activities, even if they only take a few moments, like when brushing your teeth. As in sitting, your ears should fall over your shoulders and your shoulders should fall over your hips. You should keep a slight tension in your abdominal muscles to brace your spine. Your tailbone should point down to the ground, not behind your body, resulting in an over-extended swayback posture.  INCORRECT STANDING POSTURES  Common incorrect standing postures include a forward head, locked knees and/or an excessive swayback. WALKING Walk with an upright posture. Your ears, shoulders and hips should all line-up. PROLONGED ACTIVITY IN A FLEXED POSITION When completing a task that requires you to bend forward at your waist or lean over a low surface, try to find a way to stabilize 3 out of 4 of your limbs. You can place a hand or elbow on your thigh or rest a knee on the surface you are reaching across. This will provide you more stability so that your muscles do not fatigue as quickly. By keeping your knees relaxed, or slightly bent, you will also reduce stress across your lower back. CORRECT LIFTING TECHNIQUES DO :   Assume a wide stance. This will provide you more stability and the opportunity to get as close as possible to the object which you are lifting.  Tense your abdominals to brace your spine. Bend at the knees and hips. Keeping your back locked in a neutral-spine position, lift using your leg muscles. Lift with your legs, keeping your back straight.  Test the weight of unknown objects before attempting to lift them.  Try to keep your elbows locked down at your sides in order get the best strength from your  shoulders when carrying an object.  Always ask for help when lifting heavy or awkward objects. INCORRECT LIFTING  TECHNIQUES DO NOT:   Lock your knees when lifting, even if it is a small object.  Bend and twist. Pivot at your feet or move your feet when needing to change directions.  Assume that you can safely pick up even a paper clip without proper posture.   This information is not intended to replace advice given to you by your health care provider. Make sure you discuss any questions you have with your health care provider.   Document Released: 01/03/2005 Document Revised: 01/24/2014 Document Reviewed: 04/17/2008 [employer] Interactive Patient Education [employer].

## 2015-12-07 ENCOUNTER — Encounter: Payer: Self-pay | Admitting: Family

## 2015-12-08 ENCOUNTER — Ambulatory Visit (HOSPITAL_COMMUNITY)
Admission: RE | Admit: 2015-12-08 | Discharge: 2015-12-08 | Disposition: A | Discharge status: Home / Self Care | Payer: Medicare Other | Source: Ambulatory Visit | Attending: Vascular Surgery | Admitting: Vascular Surgery

## 2015-12-08 ENCOUNTER — Ambulatory Visit (INDEPENDENT_AMBULATORY_CARE_PROVIDER_SITE_OTHER): Payer: Medicare Other | Admitting: Family

## 2015-12-08 ENCOUNTER — Encounter: Payer: Self-pay | Admitting: Family

## 2015-12-08 ENCOUNTER — Other Ambulatory Visit: Payer: Self-pay

## 2015-12-08 VITALS — BP 108/77 | HR 84 | Temp 97.8°F | Resp 20 | Ht 65.0 in | Wt 221.5 lb

## 2015-12-08 DIAGNOSIS — I200 Unstable angina: Secondary | ICD-10-CM | POA: Diagnosis not present

## 2015-12-08 DIAGNOSIS — I714 Abdominal aortic aneurysm, without rupture, unspecified: Secondary | ICD-10-CM

## 2015-12-08 DIAGNOSIS — F17200 Nicotine dependence, unspecified, uncomplicated: Secondary | ICD-10-CM

## 2015-12-08 NOTE — Progress Notes (Signed)
[facility] [place]   CC: Follow up Abdominal Aortic Aneurysm  History of Present Illness  [name] is a 70 y.o. (1945/07/10) female patient of Dr. Donnetta Hutching who returns today for continued followup of known infrarenal abdominal aortic aneurysm. Previous studies demonstrate an AAA, measuring 4.7 cm.   She does have some chronic degenerative disc disease with back pain, with recent exacerbation, relieved by sitting, but no new abdominal pain. She has recieved ESI's for lumbar spine issues, had lumbar spine surgery.   The patient does not seem to have claudication in legs with walking, denies non healing wounds. The patient denies history of stroke or TIA symptoms.  October 2017 she had 3 more cardiac stents placed, started Brilinta then.  Pt states she was prescribed Crestor, but she is not taking; pt states she told her cardiologist whom she states advised her to take.  Pt Diabetic: Yes, A1C March 2017 was 6.6, review of records Pt smoker: she states she quit smoking at her first MI; but at her February 2017 visit she reported that she smoked when she felt stressed.    Past Medical History:  Diagnosis Date  . AAA (abdominal aortic aneurysm) ([place])    a. 4.7cm in 08/2012. Followed by [facility].  . Allergic rhinitis, cause unspecified   . Anxiety state, unspecified   . Chronic LBP   . Coronary artery disease    a. s/p inferior MI 1998->PCI of RCA. b. S/p prior PCI of the OM1 and LAD;  c. 05/2013 Cath/PCI: LM nl, LAD 90p (3.0x22 Resolute DES), 29m w/ patent stent, Diag 40p, 26m, LCX patent OM1 stent, RCA 50p, 20m, EF 65%. d. 10/12 PCI with DESx3 to RCA  . Degeneration of lumbar or lumbosacral intervertebral disc   . Depressive disorder, not elsewhere classified   . Diabetes mellitus    TYPE II  . Gout 08/22/2013  . Hyperlipidemia   . Hypertension   . Hypothyroidism   . Lower GI bleed 06/2010   Diverticular bleed  . Noncompliance with medications 02/26/2014  .  Obesity, unspecified    Past Surgical History:  Procedure Laterality Date  . CARDIAC CATHETERIZATION     PCI OF BOTH THE CIRCUMFLEX AND LEFT ANTERIOR DESCENDING ARTERY  . CARDIAC CATHETERIZATION N/A 10/29/2015   Procedure: Coronary Stent Intervention;  Surgeon: Nelva Bush, MD;  Location: [facility];  Service: Cardiovascular;  Laterality: N/A;  . CARDIAC CATHETERIZATION N/A 10/29/2015   Procedure: Coronary/Graft Angiography;  Surgeon: Nelva Bush, MD;  Location: [facility];  Service: Cardiovascular;  Laterality: N/A;  . CARDIAC CATHETERIZATION N/A 10/29/2015   Procedure: Intravascular Pressure Wire/FFR Study;  Surgeon: Nelva Bush, MD;  Location: [facility];  Service: Cardiovascular;  Laterality: N/A;  . CESAREAN SECTION    . HEART STENT  04-2010  and  Jun 07, 2013   X 3  . LEFT HEART CATHETERIZATION WITH CORONARY ANGIOGRAM N/A 06/07/2013   Procedure: LEFT HEART CATHETERIZATION WITH CORONARY ANGIOGRAM;  Surgeon: [name], MD;  Location: [facility] CATH LAB;  Service: Cardiovascular;  Laterality: N/A;  . LEFT HEART CATHETERIZATION WITH CORONARY ANGIOGRAM N/A 02/25/2014   Procedure: LEFT HEART CATHETERIZATION WITH CORONARY ANGIOGRAM;  Surgeon: Troy Sine, MD;  Location: [facility] CATH LAB;  Service: Cardiovascular;  Laterality: N/A;  . LUMBAR FUSION  01/2007   DR. Patrice Paradise...3-LEVEL WITH FIXATION  . OOPHORECTOMY     BSO? pt.unsure  . PARATHYROIDECTOMY    . SPINE SURGERY    .  THYROIDECTOMY    . TOTAL ABDOMINAL HYSTERECTOMY     Social History Social History   Social History  . Marital status: Divorced    Spouse name: N/A  . Number of children: 3  . Years of education: N/A   Occupational History  . [employer]MIN [employer]  .  Retired   Social History Main Topics  . Smoking status: Former Smoker    Years: 30.00    Types: Cigarettes    Quit date: 05/31/1986  . Smokeless tobacco: Never Used  . Alcohol use No  . Drug use: No   . Sexual activity: Not Currently     Comment: 1st intercourse 70 yo-Fewer than 5 partners   Other Topics Concern  . Not on file   Social History Narrative   DIVORCED   3 CHILDREN   PATIENT SIGNED A DESIGNATED PARTY RELEASE TO ALLOW HER DAUGHTER, TRAMAINE Jentz, TO HAVE ACCESS TO HER MEDICAL RECORDS/INFORMATION. [name], May 04, 2009 @ 3:27 PM   Smokes cigarettes on rare occasions.   Family History Family History  Problem Relation Age of Onset  . Heart attack Mother 28    s/p D&C-CARDIAC ARREST 1966  . Heart disease Mother   . Heart attack Father 73    1978 WITH MI  . Heart disease Father   . Diabetes Brother   . Diabetes Maternal Aunt   . Arthritis Maternal Aunt   . Breast cancer Maternal Aunt     Post menopausal  . Colon cancer Neg Hx     Current Outpatient Prescriptions on File Prior to Visit  Medication Sig Dispense Refill  . acetaminophen (TYLENOL) 325 MG tablet Take 2 tablets (650 mg total) by mouth every 4 (four) hours as needed for headache or mild pain.    [name] allopurinol (ZYLOPRIM) 300 MG tablet Take 1 tablet (300 mg total) by mouth daily. 30 tablet 6  . aspirin 81 MG EC tablet Take 81 mg by mouth daily.     [name] atenolol (TENORMIN) 25 MG tablet Take 1 tablet (25 mg total) by mouth 2 (two) times daily. 180 tablet 3  . famotidine (PEPCID) 20 MG tablet Take 1 tablet (20 mg total) by mouth daily. 30 tablet 6  . gabapentin (NEURONTIN) 300 MG capsule 1-2 tab by mouth at bedtime 180 capsule 1  . metFORMIN (GLUCOPHAGE-XR) 500 MG 24 hr tablet TAKE 4 TABLETS BY MOUTH EVERY MORNING 360 tablet 0  . nitroGLYCERIN (NITROSTAT) 0.4 MG SL tablet Place 1 tablet (0.4 mg total) under the tongue every 5 (five) minutes as needed for chest pain. 25 tablet 3  . rosuvastatin (CRESTOR) 20 MG tablet Take 1 tablet (20 mg total) by mouth daily. 30 tablet 6  . SYNTHROID 150 MCG tablet TAKE 1 TABLET BY MOUTH EVERY DAY 90 tablet 1  . ticagrelor (BRILINTA) 90 MG TABS tablet Take 1 tablet (90 mg  total) by mouth 2 (two) times daily. 60 tablet 11  . traMADol (ULTRAM) 50 MG tablet Take 50 mg by mouth every 6 (six) hours as needed for moderate pain.    . Vitamin D, Ergocalciferol, (DRISDOL) 50000 units CAPS capsule Take 1 capsule (50,000 Units total) by mouth every 7 (seven) days. 12 capsule 0   No current facility-administered medications on file prior to visit.    Allergies  Allergen Reactions  . Prilosec [Omeprazole] Other (See Comments)    Chest pain  . Miconazole Nitrate Hives    REACTION:  hives  . Augmentin [Amoxicillin-Pot Clavulanate] Hives, Itching and Rash  . Doxycycline Other (See Comments)    REACTION: gi upset    ROS: See HPI for pertinent positives and negatives.  Physical Examination  Vitals:   12/08/15 0946  BP: 108/77  Pulse: 84  Resp: 20  Temp: 97.8 F (36.6 C)  TempSrc: Oral  SpO2: 96%  Weight: 221 lb 8 oz (100.5 kg)  Height: 5\' 5"  (1.651 m)   Body mass index is 36.86 kg/m.  General: A&O x 3, WD, obese female.  Pulmonary: Sym exp, respirations are non labored, fair air movt, no rales, rhonchi, or wheezing.  Cardiac: RRR, Nl S1, S2, no detected murmur.   Carotid Bruits Left Right   Negative Negative  Aorta is not palpable Radial pulses are 2+ palpable and =   VASCULAR EXAM:     LE Pulses LEFT RIGHT   FEMORAL not palpable not palpable    POPLITEAL not palpable  not palpable   POSTERIOR TIBIAL faintly palpable  faintly palpable    DORSALIS PEDIS  ANTERIOR TIBIAL 1+ palpable  1+ palpable     Gastrointestinal: soft, NTND, -G/R, - HSM, - palpable masses, - CVAT B.  Musculoskeletal: M/S 4/5 throughout, Extremities without ischemic changes. No peripheral edema.  Neurologic: CN 2-12  intact, Pain and light touch intact in extremities are intact except, Motor exam as listed above.    CTA Abd/pelvis 11-09-15, requested by another provider to evaluate right LQ abdominal pain: Atherosclerotic aorta. Infrarenal abdominal aortic aneurysm measures 5.4 x 4.5 cm, compared with 4.5 x 4.9 cm previously. Thrombus in the aneurysm sac. Atherosclerotic disease in the iliac arteries bilaterally. No adenopathy.   Non-Invasive Vascular Imaging  AAA Duplex (12/08/2015)  Previous size: 4.9 cm (Date: 09-03-15)  Current size:  5.4 cm by CTA abd/pelvis (Date: 11-09-15)  Medical Decision Making  The patient is a 70 y.o. female who presents with asymptomatic AAA with increase in size to 5.4 cm by CTA abd/pelvis last month.  The patient was counseled re smoking cessation and given several free resources re smoking cessation.   Serum creatinine was 0.81 on 10-14- 2017 (review of records).   Dr. Donnetta Hutching viewed the CTA abd/pelvis images from 11-09-15; he then spoke with and examined the pt.   Based on this patient's exam and diagnostic studies, the patient will be scheduled for [facility] in early January 2018 by Dr. Donnetta Hutching.  Consideration for repair of AAA would be made when the size is 5.0 cm, growth > 1 cm/yr, and symptomatic status.  I emphasized the importance of maximal medical management including strict control of blood pressure, blood glucose, and lipid levels, antiplatelet agents, obtaining regular exercise, and cessation of smoking.   The patient was given information about AAA including signs, symptoms, treatment, and how to minimize the risk of enlargement and rupture of aneurysms.    The patient was advised to call 911 should the patient experience sudden onset abdominal or back pain.   Thank you for allowing [place] to participate in this patient's care.  Clemon Chambers, RN, MSN, FNP-C [facility] [place] Office: [phone]  Clinic Physician:  Early  12/08/2015, 9:50 AM

## 2015-12-08 NOTE — Patient Instructions (Addendum)
Abdominal Aortic Aneurysm Blood pumps away from the heart through tubes (blood vessels) called arteries. Aneurysms are weak or damaged places in the wall of an artery. It bulges out like a balloon. An abdominal aortic aneurysm happens in the main artery of the body (aorta). It can burst or tear, causing bleeding inside the body. This is an emergency. It needs treatment right away. What are the causes? The exact cause is unknown. Things that could cause this problem include:  Fat and other substances building up in the lining of a tube.  Swelling of the walls of a blood vessel.  Certain tissue diseases.  Belly (abdominal) trauma.  An infection in the main artery of the body. What increases the risk? There are things that make it more likely for you to have an aneurysm. These include:  Being over the age of 70 years old.  Having high blood pressure (hypertension).  Being a female.  Being white.  Being very overweight (obese).  Having a family history of aneurysm.  Using tobacco products. What are the signs or symptoms? Symptoms depend on the size of the aneurysm and how fast it grows. There may not be symptoms. If symptoms occur, they can include:  Pain (belly, side, lower back, or groin).  Feeling full after eating a small amount of food.  Feeling sick to your stomach (nauseous), throwing up (vomiting), or both.  Feeling a lump in your belly that feels like it is beating (pulsating).  Feeling like you will pass out (faint). How is this treated?  Medicine to control blood pressure and pain.  Imaging tests to see if the aneurysm gets bigger.  Surgery. How is this prevented? To lessen your chance of getting this condition:  Stop smoking. Stop chewing tobacco.  Limit or avoid alcohol.  Keep your blood pressure, blood sugar, and cholesterol within normal limits.  Eat less salt.  Eat foods low in saturated fats and cholesterol. These are found in animal and whole  dairy products.  Eat more fiber. Fiber is found in whole grains, vegetables, and fruits.  Keep a healthy weight.  Stay active and exercise often. This information is not intended to replace advice given to you by your health care provider. Make sure you discuss any questions you have with your health care provider. Document Released: 04/30/2012 Document Revised: 06/11/2015 Document Reviewed: 02/03/2012 Elsevier Interactive Patient Education  2017 Elsevier Inc.      Steps to Quit Smoking Smoking tobacco can be bad for your health. It can also affect almost every organ in your body. Smoking puts you and people around you at risk for many serious long-lasting (chronic) diseases. Quitting smoking is hard, but it is one of the best things that you can do for your health. It is never too late to quit. What are the benefits of quitting smoking? When you quit smoking, you lower your risk for getting serious diseases and conditions. They can include:  Lung cancer or lung disease.  Heart disease.  Stroke.  Heart attack.  Not being able to have children (infertility).  Weak bones (osteoporosis) and broken bones (fractures). If you have coughing, wheezing, and shortness of breath, those symptoms may get better when you quit. You may also get sick less often. If you are pregnant, quitting smoking can help to lower your chances of having a baby of low birth weight. What can I do to help me quit smoking? Talk with your doctor about what can help you quit smoking. Some   things you can do (strategies) include:  Quitting smoking totally, instead of slowly cutting back how much you smoke over a period of time.  Going to in-person counseling. You are more likely to quit if you go to many counseling sessions.  Using resources and support systems, such as:  Online chats with a counselor.  Phone quitlines.  Printed self-help materials.  Support groups or group counseling.  Text messaging  programs.  Mobile phone apps or applications.  Taking medicines. Some of these medicines may have nicotine in them. If you are pregnant or breastfeeding, do not take any medicines to quit smoking unless your doctor says it is okay. Talk with your doctor about counseling or other things that can help you. Talk with your doctor about using more than one strategy at the same time, such as taking medicines while you are also going to in-person counseling. This can help make quitting easier. What things can I do to make it easier to quit? Quitting smoking might feel very hard at first, but there is a lot that you can do to make it easier. Take these steps:  Talk to your family and friends. Ask them to support and encourage you.  Call phone quitlines, reach out to support groups, or work with a counselor.  Ask people who smoke to not smoke around you.  Avoid places that make you want (trigger) to smoke, such as:  Bars.  Parties.  Smoke-break areas at work.  Spend time with people who do not smoke.  Lower the stress in your life. Stress can make you want to smoke. Try these things to help your stress:  Getting regular exercise.  Deep-breathing exercises.  Yoga.  Meditating.  Doing a body scan. To do this, close your eyes, focus on one area of your body at a time from head to toe, and notice which parts of your body are tense. Try to relax the muscles in those areas.  Download or buy apps on your mobile phone or tablet that can help you stick to your quit plan. There are many free apps, such as QuitGuide from the CDC (Centers for Disease Control and Prevention). You can find more support from smokefree.gov and other websites. This information is not intended to replace advice given to you by your health care provider. Make sure you discuss any questions you have with your health care provider. Document Released: 10/30/2008 Document Revised: 09/01/2015 Document Reviewed:  05/20/2014 Elsevier Interactive Patient Education  2017 Elsevier Inc.  

## 2015-12-22 ENCOUNTER — Encounter: Payer: Self-pay | Admitting: Internal Medicine

## 2015-12-22 ENCOUNTER — Other Ambulatory Visit (INDEPENDENT_AMBULATORY_CARE_PROVIDER_SITE_OTHER): Payer: Medicare Other

## 2015-12-22 ENCOUNTER — Ambulatory Visit (INDEPENDENT_AMBULATORY_CARE_PROVIDER_SITE_OTHER): Payer: Medicare Other | Admitting: Internal Medicine

## 2015-12-22 VITALS — BP 138/78 | HR 96 | Resp 20 | Wt 219.0 lb

## 2015-12-22 DIAGNOSIS — M545 Low back pain, unspecified: Secondary | ICD-10-CM

## 2015-12-22 DIAGNOSIS — R6883 Chills (without fever): Secondary | ICD-10-CM | POA: Insufficient documentation

## 2015-12-22 DIAGNOSIS — M109 Gout, unspecified: Secondary | ICD-10-CM

## 2015-12-22 DIAGNOSIS — I200 Unstable angina: Secondary | ICD-10-CM

## 2015-12-22 NOTE — Progress Notes (Signed)
Pre visit review using our clinic review tool, if applicable. No additional management support is needed unless otherwise documented below in the visit note. 

## 2015-12-22 NOTE — Patient Instructions (Addendum)
Please call [facility] on [address] for the mammogram  You had the steroid shot today, and the toradol shot for pain  Please continue all other medications as before, and refills have been done if requested.  Please have the pharmacy call with any other refills you may need.  Please continue your efforts at being more active, low cholesterol diet, and weight control.  Please keep your appointments with your specialists as you may have planned  Please go to the LAB in the Basement (turn left off the elevator) for the tests to be done today  You will be contacted by phone if any changes need to be made immediately.  Otherwise, you will receive a letter about your results with an explanation, but please check with MyChart first.  Please remember to sign up for MyChart if you have not done so, as this will be important to you in the future with finding out test results, communicating by private email, and scheduling acute appointments online when needed.

## 2015-12-22 NOTE — Progress Notes (Signed)
Subjective:    Patient ID: [name], female    DOB: 1945/03/29, 70 y.o.   MRN: [id number]  HPI  Here to f/u; overall doing ok,  Pt denies chest pain, increasing sob or doe, wheezing, orthopnea, PND, increased LE swelling, palpitations, dizziness or syncope.  Pt denies new neurological symptoms such as new headache, or facial or extremity weakness or numbness.  Pt denies polydipsia, polyuria, or low sugar episode.   Pt denies new neurological symptoms such as new headache, or facial or extremity weakness or numbness.   Pt states overall good compliance with meds,  Due for preop soon for endovascular surgury.  Pt continues to have recurring LBP without change in severity, bowel or bladder change, fever, wt loss,  worsening LE pain/numbness/weakness, gait change or falls, now worse again in last 3 days, assoc with chills and mild lower abd pain.[name]  Also c/o intermitent mod pain and swelling to bilat first MTP's, c/w gout.  No fever or trauma Past Medical History:  Diagnosis Date  . AAA (abdominal aortic aneurysm) ([place])    a. 4.7cm in 08/2012. Followed by [facility].  . Allergic rhinitis, cause unspecified   . Anxiety state, unspecified   . Chronic LBP   . Coronary artery disease    a. s/p inferior MI 1998->PCI of RCA. b. S/p prior PCI of the OM1 and LAD;  c. 05/2013 Cath/PCI: LM nl, LAD 90p (3.0x22 Resolute DES), 66m w/ patent stent, Diag 40p, 1m, LCX patent OM1 stent, RCA 50p, 50m, EF 65%. d. 10/12 PCI with DESx3 to RCA  . Degeneration of lumbar or lumbosacral intervertebral disc   . Depressive disorder, not elsewhere classified   . Diabetes mellitus    TYPE II  . Gout 08/22/2013  . Hyperlipidemia   . Hypertension   . Hypothyroidism   . Lower GI bleed 06/2010   Diverticular bleed  . Noncompliance with medications 02/26/2014  . Obesity, unspecified    Past Surgical History:  Procedure Laterality Date  . CARDIAC CATHETERIZATION     PCI OF BOTH THE CIRCUMFLEX AND LEFT ANTERIOR DESCENDING ARTERY    . CARDIAC CATHETERIZATION N/A 10/29/2015   Procedure: Coronary Stent Intervention;  Surgeon: Nelva Bush, MD;  Location: [facility];  Service: Cardiovascular;  Laterality: N/A;  . CARDIAC CATHETERIZATION N/A 10/29/2015   Procedure: Coronary/Graft Angiography;  Surgeon: Nelva Bush, MD;  Location: [place] [facility];  Service: Cardiovascular;  Laterality: N/A;  . CARDIAC CATHETERIZATION N/A 10/29/2015   Procedure: Intravascular Pressure Wire/FFR Study;  Surgeon: Nelva Bush, MD;  Location: [place] [facility];  Service: Cardiovascular;  Laterality: N/A;  . CESAREAN SECTION    . HEART STENT  04-2010  and  Jun 07, 2013   X 3  . LEFT HEART CATHETERIZATION WITH CORONARY ANGIOGRAM N/A 06/07/2013   Procedure: LEFT HEART CATHETERIZATION WITH CORONARY ANGIOGRAM;  Surgeon: [name], MD;  Location: [facility] CATH LAB;  Service: Cardiovascular;  Laterality: N/A;  . LEFT HEART CATHETERIZATION WITH CORONARY ANGIOGRAM N/A 02/25/2014   Procedure: LEFT HEART CATHETERIZATION WITH CORONARY ANGIOGRAM;  Surgeon: Troy Sine, MD;  Location: [facility] CATH LAB;  Service: Cardiovascular;  Laterality: N/A;  . LUMBAR FUSION  01/2007   DR. Patrice Paradise...3-LEVEL WITH FIXATION  . OOPHORECTOMY     BSO? pt.unsure  . PARATHYROIDECTOMY    . SPINE SURGERY    . THYROIDECTOMY    . TOTAL ABDOMINAL HYSTERECTOMY      reports that she quit smoking about 29 years ago.  Her smoking use included Cigarettes. She quit after 30.00 years of use. She has never used smokeless tobacco. She reports that she does not drink alcohol or use drugs. family history includes Arthritis in her maternal aunt; Breast cancer in her maternal aunt; Diabetes in her brother and maternal aunt; Heart attack (age of onset: 55) in her mother; Heart attack (age of onset: 57) in her father; Heart disease in her father and mother. Allergies  Allergen Reactions  . Prilosec [Omeprazole] Other (See Comments)    Chest pain  . Miconazole Nitrate Hives     REACTION: hives  . Augmentin [Amoxicillin-Pot Clavulanate] Hives, Itching and Rash  . Doxycycline Other (See Comments)    REACTION: gi upset   Current Outpatient Prescriptions on File Prior to Visit  Medication Sig Dispense Refill  . acetaminophen (TYLENOL) 325 MG tablet Take 2 tablets (650 mg total) by mouth every 4 (four) hours as needed for headache or mild pain.    [name] allopurinol (ZYLOPRIM) 300 MG tablet Take 1 tablet (300 mg total) by mouth daily. 30 tablet 6  . aspirin 81 MG EC tablet Take 81 mg by mouth daily.     [name] atenolol (TENORMIN) 25 MG tablet Take 1 tablet (25 mg total) by mouth 2 (two) times daily. 180 tablet 3  . famotidine (PEPCID) 20 MG tablet Take 1 tablet (20 mg total) by mouth daily. 30 tablet 6  . gabapentin (NEURONTIN) 300 MG capsule 1-2 tab by mouth at bedtime 180 capsule 1  . metFORMIN (GLUCOPHAGE-XR) 500 MG 24 hr tablet TAKE 4 TABLETS BY MOUTH EVERY MORNING 360 tablet 0  . nitroGLYCERIN (NITROSTAT) 0.4 MG SL tablet Place 1 tablet (0.4 mg total) under the tongue every 5 (five) minutes as needed for chest pain. 25 tablet 3  . rosuvastatin (CRESTOR) 20 MG tablet Take 1 tablet (20 mg total) by mouth daily. 30 tablet 6  . SYNTHROID 150 MCG tablet TAKE 1 TABLET BY MOUTH EVERY DAY 90 tablet 1  . ticagrelor (BRILINTA) 90 MG TABS tablet Take 1 tablet (90 mg total) by mouth 2 (two) times daily. 60 tablet 11  . traMADol (ULTRAM) 50 MG tablet Take 50 mg by mouth every 6 (six) hours as needed for moderate pain.    . Vitamin D, Ergocalciferol, (DRISDOL) 50000 units CAPS capsule Take 1 capsule (50,000 Units total) by mouth every 7 (seven) days. 12 capsule 0   No current facility-administered medications on file prior to visit.     Review of Systems  Constitutional: Negative for unusual diaphoresis or night sweats HENT: Negative for ear swelling or discharge Eyes: Negative for worsening visual haziness  Respiratory: Negative for choking and stridor.   Gastrointestinal: Negative  for distension or worsening eructation Genitourinary: Negative for retention or change in urine volume.  Musculoskeletal: Negative for other MSK pain or swelling Skin: Negative for color change and worsening wound Neurological: Negative for tremors and numbness other than noted  Psychiatric/Behavioral: Negative for decreased concentration or agitation other than above   All other system neg per pt    Objective:   Physical Exam BP 138/78   Pulse 96   Resp 20   Wt 219 lb (99.3 kg)   SpO2 99%   BMI 36.44 kg/m  VS noted,  Constitutional: Pt appears in no apparent distress HENT: Head: NCAT.  Right Ear: External ear normal.  Left Ear: External ear normal.  Eyes: . Pupils are equal, round, and reactive to light. Conjunctivae and EOM are normal Neck:  Normal range of motion. Neck supple.  Cardiovascular: Normal rate and regular rhythm.   Pulmonary/Chest: Effort normal and breath sounds without rales or wheezing.  Spine nontender except lowest midline lumbar Neurological: Pt is alert. Not confused , motor grossly intact Skin: Skin is warm. No rash, no LE edema Psychiatric: Pt behavior is normal. No agitation.  Bilat first MTP's with mild tender/red/swelling    Assessment & Plan:

## 2015-12-23 LAB — URINALYSIS, ROUTINE W REFLEX MICROSCOPIC
Bilirubin Urine: NEGATIVE
KETONES UR: NEGATIVE
LEUKOCYTES UA: NEGATIVE
Nitrite: POSITIVE — AB
PH: 6 (ref 5.0–8.0)
RBC / HPF: NONE SEEN (ref 0–?)
SPECIFIC GRAVITY, URINE: 1.015 (ref 1.000–1.030)
TOTAL PROTEIN, URINE-UPE24: NEGATIVE
URINE GLUCOSE: NEGATIVE
UROBILINOGEN UA: 1 (ref 0.0–1.0)

## 2015-12-24 ENCOUNTER — Other Ambulatory Visit: Payer: Self-pay | Admitting: Internal Medicine

## 2015-12-24 DIAGNOSIS — Z1231 Encounter for screening mammogram for malignant neoplasm of breast: Secondary | ICD-10-CM

## 2015-12-24 LAB — URINE CULTURE: Colony Count: >=100000

## 2015-12-24 MED ORDER — SULFAMETHOXAZOLE-TRIMETHOPRIM 800-160 MG PO TABS: 1.0000 | ORAL_TABLET | Freq: Two times a day (BID) | ORAL | 0 refills | Status: DC

## 2015-12-25 ENCOUNTER — Ambulatory Visit: Payer: Medicare Other

## 2015-12-28 NOTE — Assessment & Plan Note (Signed)
Mild to mod, for toradol IM 30,  to f/u any worsening symptoms or concerns

## 2015-12-28 NOTE — Assessment & Plan Note (Signed)
Etiology unclear, given back pain to check UA

## 2015-12-28 NOTE — Assessment & Plan Note (Signed)
With recurring pain, for toradol IM,  to f/u any worsening symptoms or concerns

## 2015-12-28 NOTE — Assessment & Plan Note (Signed)
Mild to mod, for depomedrol IM 80,  to f/u any worsening symptoms or concerns 

## 2015-12-31 ENCOUNTER — Encounter: Payer: Self-pay | Admitting: Physician Assistant

## 2016-01-05 ENCOUNTER — Ambulatory Visit (INDEPENDENT_AMBULATORY_CARE_PROVIDER_SITE_OTHER): Payer: Medicare Other | Admitting: Physician Assistant

## 2016-01-05 ENCOUNTER — Encounter: Payer: Self-pay | Admitting: Physician Assistant

## 2016-01-05 VITALS — BP 108/60 | HR 77 | Ht 66.0 in | Wt 219.1 lb

## 2016-01-05 DIAGNOSIS — Z01810 Encounter for preprocedural cardiovascular examination: Secondary | ICD-10-CM

## 2016-01-05 DIAGNOSIS — E7800 Pure hypercholesterolemia, unspecified: Secondary | ICD-10-CM | POA: Diagnosis not present

## 2016-01-05 DIAGNOSIS — I2510 Atherosclerotic heart disease of native coronary artery without angina pectoris: Secondary | ICD-10-CM | POA: Diagnosis not present

## 2016-01-05 DIAGNOSIS — I10 Essential (primary) hypertension: Secondary | ICD-10-CM | POA: Diagnosis not present

## 2016-01-05 DIAGNOSIS — I200 Unstable angina: Secondary | ICD-10-CM

## 2016-01-05 LAB — LIPID PANEL
Cholesterol: 157 mg/dL (ref <200)
HDL: 28 mg/dL — ABNORMAL LOW (ref >50)
LDL CALC: 104 mg/dL — AB (ref <100)
TRIGLYCERIDES: 127 mg/dL (ref <150)
Total CHOL/HDL Ratio: 5.6 Ratio — ABNORMAL HIGH (ref <5.0)
VLDL: 25 mg/dL (ref <30)

## 2016-01-05 LAB — HEPATIC FUNCTION PANEL
ALK PHOS: 72 U/L (ref 33–130)
ALT: 25 U/L (ref 6–29)
AST: 23 U/L (ref 10–35)
Albumin: 3.8 g/dL (ref 3.6–5.1)
BILIRUBIN DIRECT: 0.1 mg/dL (ref <=0.2)
BILIRUBIN INDIRECT: 0.3 mg/dL (ref 0.2–1.2)
BILIRUBIN TOTAL: 0.4 mg/dL (ref 0.2–1.2)
Total Protein: 7.6 g/dL (ref 6.1–8.1)

## 2016-01-05 MED ORDER — CLOPIDOGREL BISULFATE 75 MG PO TABS
75.0000 mg | ORAL_TABLET | Freq: Every day | ORAL | 3 refills | Status: DC
Start: 2016-01-06 — End: 2016-01-05

## 2016-01-05 MED ORDER — CLOPIDOGREL BISULFATE 75 MG PO TABS: 75.0000 mg | ORAL_TABLET | Freq: Every day | ORAL | 3 refills | Status: DC

## 2016-01-05 NOTE — Progress Notes (Signed)
Cardiology Office Note:    Date:  01/05/2016   ID:  [name], DOB December 26, 1945, MRN [id number]  PCP:  [name], MD  Cardiologist:  Dr. Sherren Mocha   Electrophysiologist:  n/a [facility]:  Dr. Donnetta Hutching  Referring MD: Biagio Borg, MD   Chief Complaint  Patient presents with  . Surgical Clearance    History of Present Illness:    [name] is a 70 y.o. female with a hx of CAD s/p prior stenting to the LAD and LCx, DM, HTN, HL, AAA.  Last LHC in 2/16 demonstrated patent LAD and LCx stents with mild disease in the RCA.  Last seen by Dr. Sherren Mocha in 6/17.  Admitted 10/17 with [place].  LHC demonstrated ostial 60% and proximal/mid 70% stenosis in the RCA, mild ISR of the LAD stents and 60% stenosis in the mid LCx adjacent to the previously stented OM.  RCA disease was hemodynamically significant by FFR and the patient underwent PCI with 3 overlapping DES to the RCA.   Last seen here in 10/17 after her most recent PCI.  She complained of recurrent chest pain.  I reviewed her case with Dr. Sherren Mocha.  We decided to pursue medical Rx first.  Her beta-blocker was adjusted.  She followed up with Dr. Donnetta Hutching in [facility] in 11/17 and her AAA had increased to 5.4 cm on CTA of the abdomen.  EVAR is recommended in January 2018.  She is here for surgical clearance.  She notes episodes of dyspnea since her most recent PCI.  She denies orthopnea, PND, edema, cough.  She denies chest pain.  She denies syncope.  She has a lot of back pain.  She denies any melena, hematochezia.   Prior CV studies that were reviewed today include:    Abd [place] 12/08/15 4.8 x 4.9 cm AAA  LHC 10/29/15 LM ok LAD prox stent patent with 30 ISR, dist 70, D2 70 LCx mid 60, OM2 stent patent RCA ost 60, ost-prox 50, prox-mid 70, mid 50, RPDA 40 FFR of RCA 0.71 (hemodynamically significant) PCI: 3 x 28 mm Promus, 3 x 16 mm Promus, 3 x 12 mm Promus DES to RCA  Conclusions: 1. Multivessel coronary artery disease, including  60% ostial and 70% proximal/mid RCA disease that is hemodynamically significant by FFR (0.71). Mild in-stent restenosis of LAD stents, as well as 60% stenosis in the mid LCx adjacent to previously stented large OM are also present. 2. Successful FFR-guided PCI of the RCA from the ostium to the midvessel with three overlapping Promus Premier stents post-dilated to 3.25 mm with 0% residual stenosis and TIMI-3 flow. 3. Patient had significant pain chest pain beginning with FFR and continuing throughout intervention despite aggressive sedation and analgesia. No clear angiographic or electrocardiographic etiology was identified. Pain had returned to pre-cath level by the end of the procedure.  Echo 10/29/15 EF 55-60, no RWMA, Gr 1 DD, MAC  Echo 11/16 EF 55-60, Gr 1 DD  LHC 2/16 LM ok LAD prox stent ok LCx stent ok RCA prox 40 EF 55-60   Abdominal US 12/15 4 x 4 cm AAA  LHC (06/06/13):  prox LAD 90%, mid LAD stent ok with 40% ISR, small caliber Dx prox 40% mid 70% (inf sub-branch), OM1 stent ok, prox RCA 50% ,mid RCA 40%, EF 65%.  PCI: 3.0 x 22 mm Resolute DES to proximal LAD  Echo (02/2012):  EF 55% to 60%. Wall motion was normal.   Past Medical History:  Diagnosis Date  . AAA (abdominal aortic aneurysm) ([place])    a. 4.7cm in 08/2012. Followed by [facility].  . Allergic rhinitis, cause unspecified   . Anxiety state, unspecified   . Chronic LBP   . Coronary artery disease    a. s/p inferior MI 1998->PCI of RCA. b. S/p prior PCI of the OM1 and LAD;  c. 05/2013 Cath/PCI: LM nl, LAD 90p (3.0x22 Resolute DES), 75m w/ patent stent, Diag 40p, 74m, LCX patent OM1 stent, RCA 50p, 51m, EF 65%. d. 10/12 PCI with DESx3 to RCA  . Degeneration of lumbar or lumbosacral intervertebral disc   . Depressive disorder, not elsewhere classified   . Diabetes mellitus    TYPE II  . Gout 08/22/2013  . Hyperlipidemia   . Hypertension   . Hypothyroidism   . Lower GI bleed 06/2010   Diverticular bleed  .  Noncompliance with medications 02/26/2014  . Obesity, unspecified     Past Surgical History:  Procedure Laterality Date  . CARDIAC CATHETERIZATION     PCI OF BOTH THE CIRCUMFLEX AND LEFT ANTERIOR DESCENDING ARTERY  . CARDIAC CATHETERIZATION N/A 10/29/2015   Procedure: Coronary Stent Intervention;  Surgeon: Nelva Bush, MD;  Location: [place] [facility];  Service: Cardiovascular;  Laterality: N/A;  . CARDIAC CATHETERIZATION N/A 10/29/2015   Procedure: Coronary/Graft Angiography;  Surgeon: Nelva Bush, MD;  Location: [place] [facility];  Service: Cardiovascular;  Laterality: N/A;  . CARDIAC CATHETERIZATION N/A 10/29/2015   Procedure: Intravascular Pressure Wire/FFR Study;  Surgeon: Nelva Bush, MD;  Location: [place] [facility];  Service: Cardiovascular;  Laterality: N/A;  . CESAREAN SECTION    . HEART STENT  04-2010  and  Jun 07, 2013   X 3  . LEFT HEART CATHETERIZATION WITH CORONARY ANGIOGRAM N/A 06/07/2013   Procedure: LEFT HEART CATHETERIZATION WITH CORONARY ANGIOGRAM;  Surgeon: [name], MD;  Location: [facility] C[facility] LAB;  Service: Cardiovascular;  Laterality: N/A;  . LEFT HEART CATHETERIZATION WITH CORONARY ANGIOGRAM N/A 02/25/2014   Procedure: LEFT HEART CATHETERIZATION WITH CORONARY ANGIOGRAM;  Surgeon: [place] Sine, MD;  Location: [facility] CATH LAB;  Service: Cardiovascular;  Laterality: N/A;  . LUMBAR FUSION  01/2007   DR. Patrice Paradise...3-LEVEL WITH FIXATION  . OOPHORECTOMY     BSO? pt.unsure  . PARATHYROIDECTOMY    . SPINE SURGERY    . THYROIDECTOMY    . TOTAL ABDOMINAL HYSTERECTOMY      Current Medications: Current Meds  Medication Sig  . acetaminophen (TYLENOL) 325 MG tablet Take 2 tablets (650 mg total) by mouth every 4 (four) hours as needed for headache or mild pain.  [name] allopurinol (ZYLOPRIM) 300 MG tablet Take 1 tablet (300 mg total) by mouth daily.  [name] aspirin 81 MG EC tablet Take 81 mg by mouth daily.   [name] atenolol (TENORMIN) 25 MG tablet Take 1 tablet (25 mg  total) by mouth 2 (two) times daily.  . colchicine 0.6 MG tablet Take 0.6 mg by mouth 3 (three) times daily.   . famotidine (PEPCID) 20 MG tablet Take 1 tablet (20 mg total) by mouth daily.  [name] gabapentin (NEURONTIN) 300 MG capsule 1-2 tab by mouth at bedtime  . metFORMIN (GLUCOPHAGE-XR) 500 MG 24 hr tablet TAKE 4 TABLETS BY MOUTH EVERY MORNING  . nitroGLYCERIN (NITROSTAT) 0.4 MG SL tablet Place 1 tablet (0.4 mg total) under the tongue every 5 (five) minutes as needed for chest pain.  . rosuvastatin (CRESTOR) 20 MG tablet Take 1 tablet (20 mg total) by  mouth daily.  [name] sulfamethoxazole-trimethoprim (BACTRIM DS) 800-160 MG tablet Take 1 tablet by mouth 2 (two) times daily.  [name] SYNTHROID 150 MCG tablet TAKE 1 TABLET BY MOUTH EVERY DAY  . traMADol (ULTRAM) 50 MG tablet Take 50 mg by mouth every 6 (six) hours as needed for moderate pain.  . Vitamin D, Ergocalciferol, (DRISDOL) 50000 units CAPS capsule Take 1 capsule (50,000 Units total) by mouth every 7 (seven) days.  . [DISCONTINUED] ticagrelor (BRILINTA) 90 MG TABS tablet Take 1 tablet (90 mg total) by mouth 2 (two) times daily.     Allergies:   Prilosec [omeprazole]; Miconazole nitrate; Augmentin [amoxicillin-pot clavulanate]; and Doxycycline   Social History   Social History  . Marital status: Divorced    Spouse name: N/A  . Number of children: 3  . Years of education: N/A   Occupational History  . 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 Smoking status: Former Smoker    Years: 30.00    Types: Cigarettes    Quit date: 05/31/1986  . Smokeless tobacco: Never Used  . Alcohol use No  . Drug use: No  . Sexual activity: Not Currently     Comment: 1st intercourse 70 yo-Fewer than 5 partners   Other Topics Concern  . None   Social History Narrative   DIVORCED   3 CHILDREN   PATIENT SIGNED A DESIGNATED PARTY RELEASE TO ALLOW HER DAUGHTER, TRAMAINE Lembcke, TO HAVE ACCESS TO HER MEDICAL  RECORDS/INFORMATION. [name], May 04, 2009 @ 3:27 PM   Smokes cigarettes on rare occasions.     Family History:  The patient's family history includes Arthritis in her maternal aunt; Breast cancer in her maternal aunt; Diabetes in her brother and maternal aunt; Heart attack (age of onset: 71) in her mother; Heart attack (age of onset: 53) in her father; Heart disease in her father and mother.   ROS:   Please see the history of present illness.    ROS All other systems reviewed and are negative.   EKGs/Labs/Other Test Reviewed:    EKG:  EKG is  ordered today.  The ekg ordered today demonstrates NSR, HR 77, LAD, QTc 441 ms, no change from prior tracing   Recent Labs: 03/27/2015: TSH 6.39 10/31/2015: BUN 9; Creatinine, Ser 0.81; Potassium 3.9; Sodium 141 11/06/2015: ALT 27; Hemoglobin 14.2; Platelets 325.0   Recent Lipid Panel    Component Value Date/Time   CHOL 173 10/30/2015 0255   TRIG 125 10/30/2015 0255   TRIG 102 01/02/2006 0825   HDL 33 (L) 10/30/2015 0255   CHOLHDL 5.2 10/30/2015 0255   VLDL 25 10/30/2015 0255   LDLCALC 115 (H) 10/30/2015 0255   LDLDIRECT 131.0 03/27/2015 1137     Physical Exam:    VS:  BP 108/60   Pulse 77   Ht 5\' 6"  (1.676 m)   Wt 219 lb 1.9 oz (99.4 kg)   BMI 35.37 kg/m     Wt Readings from Last 3 Encounters:  01/05/16 219 lb 1.9 oz (99.4 kg)  12/22/15 219 lb (99.3 kg)  12/08/15 221 lb 8 oz (100.5 kg)     Physical Exam  Constitutional: She is oriented to person, place, and time. She appears well-developed and well-nourished. No distress.  HENT:  Head: Normocephalic and atraumatic.  Eyes: No scleral icterus.  Neck: No JVD present.  Cardiovascular: Normal rate, regular rhythm and normal heart sounds.  No murmur heard. Pulmonary/Chest: Effort normal. She has no wheezes. She has no rales.  Abdominal: Soft. There is no tenderness.  Musculoskeletal: She exhibits no edema.  Neurological: She is alert and oriented to person, place,  and time.  Skin: Skin is warm and dry.  Psychiatric: She has a normal mood and affect.    ASSESSMENT:    1. Coronary artery disease involving native coronary artery of native heart without angina pectoris   2. Preoperative cardiovascular examination   3. Essential hypertension   4. Pure hypercholesterolemia    PLAN:    In order of problems listed above:  1. CAD - s/p prior stenting to the LAD and LCx in the past and most recently overlapping DES x 3 to the RCA in 10/17 in the setting of [place] (no ACS).  She denies chest pain.  She needs EVAR for her AAA. She is not able to come off of Brilinta and ASA until 10/2016 due to recent DES to her RCA.  I spoke with Dr. Burt Knack over the phone today.  He will review with Dr. Donnetta Hutching to see if he can do her surgery on ASA and Plavix.  She notes shortness of breath since her PCI and I believe this is a side effect of Brilinta.  I think we should go ahead and change her Brilinta to Plavix now.  She notes a prior hx of having to stop this medication.  I reviewed her chart.  She stopped Plavix on her own in 2015 b/c she was having some numbness and tingling in her hands and some leg swelling. I did explain to her today that this was not likely a side effect of this drug.  She did not have a rash.  Therefore, I think it is ok for her to change to Plavix.  -  DC Brilinta  -  Start Plavix 75 mg QD             -  Continue ASA, statin, beta-blocker.  -  Dr. Burt Knack spoke with Dr. Donnetta Hutching - it is ok to do her EVAR on Plavix  2. Surgical clearance - She needs EVAR with Dr. Donnetta Hutching for her AAA.  The patient does not have any unstable cardiac conditions.  She underwent cardiac cath in 10/17 with PCI of the RCA.  She has patent LAD and LCx stents with moderate non-obstructive disease elsewhere.  She denies recurrent chest pain.  According to [facility] and AHA guidelines, she requires no further cardiac workup prior to her noncardiac surgery and should be at acceptable risk.  Our  service is available as necessary in the perioperative period.    3. HTN - BP is controlled.    4. HL - Continue Rosuvastatin 20.  Check Lipids and LFTs today.    Medication Adjustments/Labs and Tests Ordered: Current medicines are reviewed at length with the patient today.  Concerns regarding medicines are outlined above.  Medication changes, Labs and Tests ordered today are outlined in the Patient Instructions noted below. Patient Instructions  Medication Instructions:  1. TAKE YOUR LAST DOSE OF BRILINTA TONIGHT THEN STOP 2. BEGINNING TOMORROW 01/06/16 YOU WILL START ON PLAVIX 75 MG DAILY; RX HAS  BEEN SENT IN PER PT FOR BRAND NAME  Labwork: TODAY FASTING LIPID AND LIVER PANEL  Testing/Procedures: NONE  Follow-Up: Your physician wants you to follow-up in: 6 MONTHS WITH DR. Emelda Fear will receive a reminder letter in the mail two months in advance. If you don't receive  a letter, please call our office to schedule the follow-up appointment.  Any Other Special Instructions Will Be Listed Below (If Applicable).  If you need a refill on your cardiac medications before your next appointment, please call your pharmacy.  Signed, Richardson Dopp, PA-C  01/05/2016 4:29 PM    [facility] [address], [place],   [address] Phone: [phone]; Fax: [phone]

## 2016-01-05 NOTE — Patient Instructions (Addendum)
Medication Instructions:  1. TAKE YOUR LAST DOSE OF BRILINTA TONIGHT THEN STOP 2. BEGINNING TOMORROW 01/06/16 YOU WILL START ON PLAVIX 75 MG DAILY; RX HAS  BEEN SENT IN PER PT FOR BRAND NAME  Labwork: TODAY FASTING LIPID AND LIVER PANEL  Testing/Procedures: NONE  Follow-Up: Your physician wants you to follow-up in: 6 MONTHS WITH DR. Emelda Fear will receive a reminder letter in the mail two months in advance. If you don't receive a letter, please call our office to schedule the follow-up appointment.  Any Other Special Instructions Will Be Listed Below (If Applicable).  If you need a refill on your cardiac medications before your next appointment, please call your pharmacy.

## 2016-01-06 ENCOUNTER — Telehealth: Payer: Self-pay | Admitting: *Deleted

## 2016-01-06 DIAGNOSIS — I2510 Atherosclerotic heart disease of native coronary artery without angina pectoris: Secondary | ICD-10-CM

## 2016-01-06 DIAGNOSIS — E7800 Pure hypercholesterolemia, unspecified: Secondary | ICD-10-CM

## 2016-01-06 MED ORDER — ROSUVASTATIN CALCIUM 40 MG PO TABS: 40.0000 mg | ORAL_TABLET | Freq: Every day | ORAL | 3 refills | Status: DC

## 2016-01-06 NOTE — Telephone Encounter (Signed)
Pt notified of lab results and findings by phone with verbal understanding. Pt agreeable to plan of care to increase Crestor to 40 mg daily, FLP/LFT 04/12/16. New Rx sent in for the Crestor 40 mg.

## 2016-01-07 ENCOUNTER — Telehealth: Payer: Self-pay

## 2016-01-07 NOTE — Telephone Encounter (Signed)
Prior auth for BRAND Plavix 75mg  submitted to [facility].

## 2016-01-12 ENCOUNTER — Ambulatory Visit: Payer: Medicare Other | Admitting: Physician Assistant

## 2016-01-15 ENCOUNTER — Encounter (HOSPITAL_COMMUNITY): Payer: Self-pay

## 2016-01-15 ENCOUNTER — Encounter (HOSPITAL_COMMUNITY)
Admission: RE | Admit: 2016-01-15 | Discharge: 2016-01-15 | Disposition: A | Discharge status: Home / Self Care | Payer: Medicare Other | Source: Ambulatory Visit | Attending: Vascular Surgery | Admitting: Vascular Surgery

## 2016-01-15 DIAGNOSIS — Z01818 Encounter for other preprocedural examination: Secondary | ICD-10-CM | POA: Diagnosis not present

## 2016-01-15 DIAGNOSIS — I714 Abdominal aortic aneurysm, without rupture: Secondary | ICD-10-CM | POA: Insufficient documentation

## 2016-01-15 LAB — COMPREHENSIVE METABOLIC PANEL
ALT: 24 U/L (ref 14–54)
ANION GAP: 10 (ref 5–15)
AST: 28 U/L (ref 15–41)
Albumin: 3.4 g/dL — ABNORMAL LOW (ref 3.5–5.0)
Alkaline Phosphatase: 79 U/L (ref 38–126)
BUN: 7 mg/dL (ref 6–20)
CHLORIDE: 109 mmol/L (ref 101–111)
CO2: 21 mmol/L — AB (ref 22–32)
Calcium: 8.7 mg/dL — ABNORMAL LOW (ref 8.9–10.3)
Creatinine, Ser: 0.79 mg/dL (ref 0.44–1.00)
Glucose, Bld: 122 mg/dL — ABNORMAL HIGH (ref 65–99)
Potassium: 4 mmol/L (ref 3.5–5.1)
SODIUM: 140 mmol/L (ref 135–145)
Total Bilirubin: 0.5 mg/dL (ref 0.3–1.2)
Total Protein: 7.8 g/dL (ref 6.5–8.1)

## 2016-01-15 LAB — CBC
HCT: 42.9 % (ref 36.0–46.0)
Hemoglobin: 13.7 g/dL (ref 12.0–15.0)
MCH: 28.1 pg (ref 26.0–34.0)
MCHC: 31.9 g/dL (ref 30.0–36.0)
MCV: 87.9 fL (ref 78.0–100.0)
PLATELETS: 280 10*3/uL (ref 150–400)
RBC: 4.88 MIL/uL (ref 3.87–5.11)
RDW: 14.5 % (ref 11.5–15.5)
WBC: 7.8 10*3/uL (ref 4.0–10.5)

## 2016-01-15 LAB — BLOOD GAS, ARTERIAL
ACID-BASE DEFICIT: 0.3 mmol/L (ref 0.0–2.0)
BICARBONATE: 23.6 mmol/L (ref 20.0–28.0)
DRAWN BY: 42180
FIO2: 21
O2 SAT: 97.3 %
PCO2 ART: 37 mmHg (ref 32.0–48.0)
PH ART: 7.42 (ref 7.350–7.450)
Patient temperature: 98.6
pO2, Arterial: 91.9 mmHg (ref 83.0–108.0)

## 2016-01-15 LAB — URINALYSIS, ROUTINE W REFLEX MICROSCOPIC
BILIRUBIN URINE: NEGATIVE
GLUCOSE, UA: NEGATIVE mg/dL
HGB URINE DIPSTICK: NEGATIVE
KETONES UR: NEGATIVE mg/dL
LEUKOCYTES UA: NEGATIVE
Nitrite: NEGATIVE
PH: 5 (ref 5.0–8.0)
PROTEIN: NEGATIVE mg/dL
Specific Gravity, Urine: 1.01 (ref 1.005–1.030)

## 2016-01-15 LAB — SURGICAL PCR SCREEN
MRSA, PCR: NEGATIVE
Staphylococcus aureus: NEGATIVE

## 2016-01-15 LAB — TYPE AND SCREEN
ABO/RH(D): O POS
ANTIBODY SCREEN: NEGATIVE

## 2016-01-15 LAB — APTT: APTT: 27 s (ref 24–36)

## 2016-01-15 LAB — PROTIME-INR
INR: 1.05
PROTHROMBIN TIME: 13.7 s (ref 11.4–15.2)

## 2016-01-15 LAB — ABO/RH: ABO/RH(D): O POS

## 2016-01-15 LAB — GLUCOSE, CAPILLARY: GLUCOSE-CAPILLARY: 115 mg/dL — AB (ref 65–99)

## 2016-01-15 NOTE — Progress Notes (Signed)
Pt denies SOB and chest pain but is under the care of Dr. Burt Knack, Cardiology. Pt stated that she should have started taking Plavix " about  a week and a half ago ," since stopping the Brilinta, due to intolerance. Pt stated that she has not started taking the Plavix because she cannot afford it. Pt stated that Dr. Antionette Char office did not have any samples available and she is trying to get an appointment with PCP, Dr. [name] to see if she can get samples from that office. Left voice message with Arbie Cookey, RN, at surgeon's office to make MD aware that pt is not taking Plavix as prescribed.  Spoke with Gay Filler, Surveyor, quantity, Case Management, regarding resources; Gay Filler reviewed pt info and advised that pt can print coupons online and add " Part D" to current medical plan. Pt made aware and stated that she will ask for coupons to be printed at PCP's office visit . Pt chart forwarded to anesthesia to review history.

## 2016-01-15 NOTE — Pre-Procedure Instructions (Signed)
[name]  01/15/2016      [id number] - [address], [address] [place] [address] Phone: [phone] Fax: [phone]    Your procedure is scheduled on Friday, January 22, 2016  Report to [facility] Admitting at 5:30 A.M.  Call this number if you have problems the morning of surgery:  [phone]   Remember:  Do not eat food or drink liquids after midnight Thursday, January 21, 2016  Take these medicines the morning of surgery with A SIP OF WATER :aspirin, atenolol (TENORMIN), SYNTHROID, if needed: pain medication, gout medication, famotidine (PEPCID)  for acid reflux, nitroGLYCERIN for chest pain Stop taking vitamins, fish oil and herbal medications. Do not take any NSAIDs ie: Ibuprofen, Advil, Naproxen, BC and Goody Powder; stop now.    How to Manage Your Diabetes Before and After Surgery  Why is it important to control my blood sugar before and after surgery? . Improving blood sugar levels before and after surgery helps healing and can limit problems. . A way of improving blood sugar control is eating a healthy diet by: o  Eating less sugar and carbohydrates o  Increasing activity/exercise o  Talking with your doctor about reaching your blood sugar goals . High blood sugars (greater than 180 mg/dL) can raise your risk of infections and slow your recovery, so you will need to focus on controlling your diabetes during the weeks before surgery. . Make sure that the doctor who takes care of your diabetes knows about your planned surgery including the date and location.  How do I manage my blood sugar before surgery? . Check your blood sugar at least 4 times a day, starting 2 days before surgery, to make sure that the level is not too high or low. o Check your blood sugar the morning of your surgery when you wake up and every 2 hours until you get to the Short Stay  unit. . If your blood sugar is less than 70 mg/dL, you will need to treat for low blood sugar: o Do not take insulin. o Treat a low blood sugar (less than 70 mg/dL) with  cup of clear juice (cranberry or apple), 4 glucose tablets, OR glucose gel. o Recheck blood sugar in 15 minutes after treatment (to make sure it is greater than 70 mg/dL). If your blood sugar is not greater than 70 mg/dL on recheck, call [phone] for further instructions. . Report your blood sugar to the short stay nurse when you get to Short Stay.  . If you are admitted to the hospital after surgery: o Your blood sugar will be checked by the staff and you will probably be given insulin after surgery (instead of oral diabetes medicines) to make sure you have good blood sugar levels. o The goal for blood sugar control after surgery is 80-180 mg/dL.  WHAT DO I DO ABOUT MY DIABETES MEDICATION?   [name] Do not take oral diabetes medicines (pills) the morning of surgery such as  metFORMIN (GLUCOPHAGE.   Patient Signature:  Date:   Nurse Signature:  Date:   Reviewed and Endorsed by [facility] Patient Education Committee, August 2015  Do not wear jewelry, make-up or nail polish.  Do not wear lotions, powders, or perfumes, or deoderant.  Do not shave 48 hours prior to surgery.    Do not bring valuables to the hospital.  [facility] is not responsible for any belongings or valuables.  Contacts, dentures or bridgework may not be worn into surgery.  Leave your suitcase in the car.  After surgery it may be brought to your room.  For patients admitted to the hospital, discharge time will be determined by your treatment team.  Patients discharged the day of surgery will not be allowed to drive home.  Special instructions: Shower the night before surgery and the morning of surgery with CHG. Please read over the following fact sheets that you were given. Pain Booklet, Coughing and Deep Breathing, Blood Transfusion Information,  MRSA Information and Surgical Site Infection Prevention

## 2016-01-19 ENCOUNTER — Telehealth: Payer: Self-pay | Admitting: Cardiovascular Disease

## 2016-01-19 NOTE — Telephone Encounter (Signed)
Called Angela back with anesthesia. She informed our office that patient has not been taking her Plavix since she stopped her Brilinta on 01/05/16 due to cost. Patient was suppose to have surgery on 01/22/16 with Dr. Donnetta Hutching. Patient had cardiac cath on 10/17 with PCI of the RCA. Consulted DOD, Dr. Curt Bears, he stated if patient is not taking Plavix, then she cannot have her surgery, and that she has to take her Plavix or she will have blockages.  Called patient about taking her medication, Plavix. Patient stated that the plavix was too expensive at $74.00. Informed patient that she needs to take her Plavix or her stents could become blocked, which would cause her to have a heart attack and die. Patient stated she cannot take generic form of Plavix, and that is why it's so expensive for her. Talked with Vaughan Basta our pre auth nurse about a tier exception with her insurance ([employer]). Informed patient until insurance is worked out, to try to pick up just a week supply. Also informed patient about [employer] card and printed a coupon for a 30 day supply for $25.00 from [employer] website. Left card and coupon at front desk for patient to pick up. Patient agreed to pick up coupon and card at our office. Informed patient that this should help this month, and hopefully by next month a tier exception with her insurance will be already processed. Will forward to Dr. Burt Knack and Dr. Donnetta Hutching, so they are aware.

## 2016-01-19 NOTE — Progress Notes (Addendum)
Anesthesia Chart Review:  Pt is a 71 year old female scheduled for endovascular stent graft insertion for AAA on 01/22/2016 with Curt Jews, MD.   - PCP is [name], MD.  - Cardiologist is Sherren Mocha, MD, last office visit 01/05/16 with Richardson Dopp, PA   PMH includes:  CAD (3 overlapping DES to RCA 10/2015; stenting to LAD, CX), HTN, DM, hyperlipidemia, AAA, hypothyroidism.  Former smoker. BMI 36  Medications include: ASA, atenolol, pepcid, metformin, crestor, synthroid. Pt is supposed to be on plavix but is not taking it because she cannot afford it.   Preoperative labs reviewed.    CXR 10/29/15: No active cardiopulmonary disease.  Aortic atherosclerosis.  EKG 01/05/16: NSR. LAD. Cannot rule out anterior infarct, age undetermined.   Cardiac cath 10/29/15: 1. Multivessel coronary artery disease, including 60% ostial and 70% proximal/mid RCA disease that is hemodynamically significant by FFR (0.71).  Mild in-stent restenosis of LAD stents, as well as 60% stenosis in the mid LCx adjacent to previously stented large OM are also present. 2. Successful FFR-guided PCI of the RCA from the ostium to the midvessel with three overlapping Promus Premier stents post-dilated to 3.25 mm with 0% residual stenosis and TIMI-3 flow. 3. Patient had significant pain chest pain beginning with FFR and continuing throughout intervention despite aggressive sedation and analgesia.  No clear angiographic or electrocardiographic etiology was identified.  Pain had returned to pre-cath level by the end of the procedure.  Recommendations: 1. Transfer to 2-Heart for aggressive medical management of chest pain, including weaning of NTG infusion as tolerated. 2. Dual antiplatelet therapy with aspirin and ticagrelor for at least 12 months, ideally longer. 3. Aggressive secondary prevention. 4. If patient continues to have chest pain, consider repeat catheterization with FFR and/or PCI to mid LCx. 5. Due to marked  tortuosity of the innominate/right subclavian artery, consider alternate vascular access for further cardiac catheterizations.  Echo 10/29/15:  - Left ventricle: The cavity size was normal. Wall thickness was normal. Systolic function was normal. The estimated ejection fraction was in the range of 55% to 60%. Wall motion was normal; there were no regional wall motion abnormalities. Doppler parameters are consistent with abnormal left ventricular relaxation (grade 1 diastolic dysfunction). - Mitral valve: Calcified annulus.  Pt was changed from Brilinta to plavix for this surgery on 01/06/16, however she never started the plavix due to cost.  I notified Dr. Antionette Char office as pt had 3 DES placed 10/2015.  I spoke with triage RN Amelia Jo at Dr. Antionette Char office who spoke with doctor of the day Dr. Curt Bears. Pt cannot have surgery if not taking plavix. Pam is working on getting pt sufficient supply for surgery. I notified Colletta Maryland in Dr. Luther Parody office.   Willeen Cass, FNP-BC [facility] Short Stay Surgical Center/Anesthesiology Phone: [phone] 01/19/2016 4:44 PM  Addendum:  Colletta Maryland from Dr. Luther Parody office called. Pt has reportedly obtained plavix and has started it. Dr. Donnetta Hutching plans on proceeding with surgery at this point.   Willeen Cass, FNP-BC [facility] Short Stay Surgical Center/Anesthesiology Phone: [phone] 01/20/2016 4:42 PM

## 2016-01-19 NOTE — Telephone Encounter (Signed)
Willeen Cass is calling because she needs to speak to some one about the patient's Plavix. Levada Dy is a NP at [facility] with anesthesia. Please call at [phone]. Thanks.

## 2016-01-20 NOTE — Telephone Encounter (Signed)
We switched to plavix because of considerations around surgery with presumed lower bleeding risk. If she cannot take plavix, could check back with Dr Early whether he is comfortable doing EVAR with her on Brilinta.

## 2016-01-21 MED ORDER — SODIUM CHLORIDE 0.9 % IV SOLN
1500.0000 mg | INTRAVENOUS | Status: AC
  Administered 2016-01-22: 1500 mg via INTRAVENOUS
  Filled 2016-01-21: qty 1500

## 2016-01-21 NOTE — Telephone Encounter (Signed)
I spoke with the pt and she picked up Good Rx card and prescription from the front desk on 01/19/2016.  The pt started Plavix 75mg  daily on 01/19/16. I once again made the pt aware of the importance of taking this medication as prescribed.

## 2016-01-22 ENCOUNTER — Encounter (HOSPITAL_COMMUNITY): Payer: Self-pay | Admitting: *Deleted

## 2016-01-22 ENCOUNTER — Inpatient Hospital Stay (HOSPITAL_COMMUNITY): Payer: Medicare Other | Admitting: Emergency Medicine

## 2016-01-22 ENCOUNTER — Encounter (HOSPITAL_COMMUNITY)
Admission: RE | Disposition: A | Discharge status: Home / Self Care | Payer: Self-pay | Source: Ambulatory Visit | Attending: Vascular Surgery

## 2016-01-22 ENCOUNTER — Other Ambulatory Visit: Payer: Self-pay | Admitting: *Deleted

## 2016-01-22 ENCOUNTER — Inpatient Hospital Stay (HOSPITAL_COMMUNITY): Payer: Medicare Other

## 2016-01-22 ENCOUNTER — Inpatient Hospital Stay (HOSPITAL_COMMUNITY)
Admission: RE | Admit: 2016-01-22 | Discharge: 2016-01-24 | DRG: 269 | Disposition: A | Discharge status: Home / Self Care | Payer: Medicare Other | Source: Ambulatory Visit | Attending: Vascular Surgery | Admitting: Vascular Surgery

## 2016-01-22 ENCOUNTER — Inpatient Hospital Stay (HOSPITAL_COMMUNITY): Payer: Medicare Other | Admitting: Certified Registered Nurse Anesthetist

## 2016-01-22 DIAGNOSIS — E785 Hyperlipidemia, unspecified: Secondary | ICD-10-CM | POA: Diagnosis present

## 2016-01-22 DIAGNOSIS — Z79899 Other long term (current) drug therapy: Secondary | ICD-10-CM

## 2016-01-22 DIAGNOSIS — F411 Generalized anxiety disorder: Secondary | ICD-10-CM | POA: Diagnosis present

## 2016-01-22 DIAGNOSIS — F329 Major depressive disorder, single episode, unspecified: Secondary | ICD-10-CM | POA: Diagnosis present

## 2016-01-22 DIAGNOSIS — I714 Abdominal aortic aneurysm, without rupture, unspecified: Secondary | ICD-10-CM

## 2016-01-22 DIAGNOSIS — Z7982 Long term (current) use of aspirin: Secondary | ICD-10-CM | POA: Diagnosis not present

## 2016-01-22 DIAGNOSIS — I2510 Atherosclerotic heart disease of native coronary artery without angina pectoris: Secondary | ICD-10-CM

## 2016-01-22 DIAGNOSIS — Z981 Arthrodesis status: Secondary | ICD-10-CM | POA: Diagnosis not present

## 2016-01-22 DIAGNOSIS — Z6836 Body mass index (BMI) 36.0-36.9, adult: Secondary | ICD-10-CM | POA: Diagnosis not present

## 2016-01-22 DIAGNOSIS — Z95828 Presence of other vascular implants and grafts: Secondary | ICD-10-CM | POA: Diagnosis present

## 2016-01-22 DIAGNOSIS — M109 Gout, unspecified: Secondary | ICD-10-CM | POA: Diagnosis present

## 2016-01-22 DIAGNOSIS — Z8679 Personal history of other diseases of the circulatory system: Secondary | ICD-10-CM

## 2016-01-22 DIAGNOSIS — Z7984 Long term (current) use of oral hypoglycemic drugs: Secondary | ICD-10-CM | POA: Diagnosis not present

## 2016-01-22 DIAGNOSIS — I10 Essential (primary) hypertension: Secondary | ICD-10-CM | POA: Diagnosis present

## 2016-01-22 DIAGNOSIS — E039 Hypothyroidism, unspecified: Secondary | ICD-10-CM | POA: Diagnosis present

## 2016-01-22 DIAGNOSIS — E669 Obesity, unspecified: Secondary | ICD-10-CM | POA: Diagnosis present

## 2016-01-22 DIAGNOSIS — E1151 Type 2 diabetes mellitus with diabetic peripheral angiopathy without gangrene: Secondary | ICD-10-CM | POA: Diagnosis present

## 2016-01-22 DIAGNOSIS — Z955 Presence of coronary angioplasty implant and graft: Secondary | ICD-10-CM

## 2016-01-22 DIAGNOSIS — Z87891 Personal history of nicotine dependence: Secondary | ICD-10-CM | POA: Diagnosis not present

## 2016-01-22 DIAGNOSIS — I252 Old myocardial infarction: Secondary | ICD-10-CM | POA: Diagnosis not present

## 2016-01-22 HISTORY — PX: ENDOVASCULAR STENT INSERTION: SHX5161

## 2016-01-22 HISTORY — DX: Presence of other vascular implants and grafts: Z95.828

## 2016-01-22 LAB — BASIC METABOLIC PANEL
Anion gap: 8 (ref 5–15)
BUN: 6 mg/dL (ref 6–20)
CALCIUM: 8.3 mg/dL — AB (ref 8.9–10.3)
CHLORIDE: 107 mmol/L (ref 101–111)
CO2: 23 mmol/L (ref 22–32)
CREATININE: 0.98 mg/dL (ref 0.44–1.00)
GFR calc Af Amer: >60 mL/min (ref >60)
GFR calc non Af Amer: 57 mL/min — ABNORMAL LOW (ref >60)
GLUCOSE: 240 mg/dL — AB (ref 65–99)
Potassium: 4.4 mmol/L (ref 3.5–5.1)
Sodium: 138 mmol/L (ref 135–145)

## 2016-01-22 LAB — CBC
HEMATOCRIT: 38.8 % (ref 36.0–46.0)
HEMOGLOBIN: 12.6 g/dL (ref 12.0–15.0)
MCH: 28.3 pg (ref 26.0–34.0)
MCHC: 32.5 g/dL (ref 30.0–36.0)
MCV: 87.2 fL (ref 78.0–100.0)
Platelets: 240 10*3/uL (ref 150–400)
RBC: 4.45 MIL/uL (ref 3.87–5.11)
RDW: 14.6 % (ref 11.5–15.5)
WBC: 8.7 10*3/uL (ref 4.0–10.5)

## 2016-01-22 LAB — GLUCOSE, CAPILLARY
GLUCOSE-CAPILLARY: 151 mg/dL — AB (ref 65–99)
Glucose-Capillary: 138 mg/dL — ABNORMAL HIGH (ref 65–99)

## 2016-01-22 LAB — APTT: aPTT: 29 seconds (ref 24–36)

## 2016-01-22 LAB — PROTIME-INR
INR: 1.08
PROTHROMBIN TIME: 14 s (ref 11.4–15.2)

## 2016-01-22 LAB — MAGNESIUM: MAGNESIUM: 1.6 mg/dL — AB (ref 1.7–2.4)

## 2016-01-22 SURGERY — ENDOVASCULAR STENT GRAFT INSERTION
Anesthesia: General | Site: Abdomen

## 2016-01-22 MED ORDER — DEXAMETHASONE SODIUM PHOSPHATE 10 MG/ML IJ SOLN
INTRAMUSCULAR | Status: AC
  Filled 2016-01-22: qty 1

## 2016-01-22 MED ORDER — TRAMADOL HCL 50 MG PO TABS
50.0000 mg | ORAL_TABLET | Freq: Four times a day (QID) | ORAL | Status: DC | PRN
  Administered 2016-01-22 – 2016-01-24 (×2): 50 mg via ORAL
  Filled 2016-01-22 (×2): qty 1

## 2016-01-22 MED ORDER — VANCOMYCIN HCL IN DEXTROSE 1-5 GM/200ML-% IV SOLN
1000.0000 mg | Freq: Two times a day (BID) | INTRAVENOUS | Status: AC
  Administered 2016-01-22 – 2016-01-23 (×2): 1000 mg via INTRAVENOUS
  Filled 2016-01-22 (×3): qty 200

## 2016-01-22 MED ORDER — 0.9 % SODIUM CHLORIDE (POUR BTL) OPTIME
TOPICAL | Status: DC | PRN
  Administered 2016-01-22: 1000 mL

## 2016-01-22 MED ORDER — MAGNESIUM SULFATE 2 GM/50ML IV SOLN
2.0000 g | Freq: Every day | INTRAVENOUS | Status: DC | PRN
  Filled 2016-01-22: qty 50

## 2016-01-22 MED ORDER — LABETALOL HCL 5 MG/ML IV SOLN: 10.0000 mg | INTRAVENOUS | Status: DC | PRN

## 2016-01-22 MED ORDER — CHLORHEXIDINE GLUCONATE CLOTH 2 % EX PADS: 6.0000 | MEDICATED_PAD | Freq: Once | CUTANEOUS | Status: DC

## 2016-01-22 MED ORDER — POLYETHYLENE GLYCOL 3350 17 G PO PACK: 17.0000 g | PACK | Freq: Every day | ORAL | Status: DC | PRN

## 2016-01-22 MED ORDER — ROCURONIUM BROMIDE 50 MG/5ML IV SOSY
PREFILLED_SYRINGE | INTRAVENOUS | Status: AC
  Filled 2016-01-22: qty 10

## 2016-01-22 MED ORDER — LEVOTHYROXINE SODIUM 75 MCG PO TABS
150.0000 ug | ORAL_TABLET | Freq: Every day | ORAL | Status: DC
  Administered 2016-01-22 – 2016-01-24 (×3): 150 ug via ORAL
  Filled 2016-01-22 (×3): qty 2

## 2016-01-22 MED ORDER — ONDANSETRON HCL 4 MG/2ML IJ SOLN
INTRAMUSCULAR | Status: DC | PRN
  Administered 2016-01-22: 4 mg via INTRAVENOUS

## 2016-01-22 MED ORDER — OXYCODONE HCL 5 MG PO TABS
ORAL_TABLET | ORAL | Status: AC
  Filled 2016-01-22: qty 1

## 2016-01-22 MED ORDER — METOPROLOL TARTRATE 5 MG/5ML IV SOLN: 2.0000 mg | INTRAVENOUS | Status: DC | PRN

## 2016-01-22 MED ORDER — HEPARIN SODIUM (PORCINE) 1000 UNIT/ML IJ SOLN
INTRAMUSCULAR | Status: DC | PRN
  Administered 2016-01-22: 7 mL via INTRAVENOUS

## 2016-01-22 MED ORDER — ASPIRIN EC 81 MG PO TBEC
81.0000 mg | DELAYED_RELEASE_TABLET | Freq: Every day | ORAL | Status: DC
  Administered 2016-01-22 – 2016-01-24 (×3): 81 mg via ORAL
  Filled 2016-01-22 (×3): qty 1

## 2016-01-22 MED ORDER — BISACODYL 10 MG RE SUPP
10.0000 mg | Freq: Every day | RECTAL | Status: DC | PRN
Start: 2016-01-22 — End: 2016-01-24

## 2016-01-22 MED ORDER — COLCHICINE 0.6 MG PO TABS: 0.6000 mg | ORAL_TABLET | Freq: Three times a day (TID) | ORAL | Status: DC | PRN

## 2016-01-22 MED ORDER — DEXAMETHASONE SODIUM PHOSPHATE 4 MG/ML IJ SOLN
INTRAMUSCULAR | Status: DC | PRN
  Administered 2016-01-22: 10 mg via INTRAVENOUS

## 2016-01-22 MED ORDER — ATENOLOL 25 MG PO TABS
25.0000 mg | ORAL_TABLET | Freq: Once | ORAL | Status: AC
  Administered 2016-01-22: 25 mg via ORAL
  Filled 2016-01-22: qty 1

## 2016-01-22 MED ORDER — FENTANYL CITRATE (PF) 250 MCG/5ML IJ SOLN
INTRAMUSCULAR | Status: AC
  Filled 2016-01-22: qty 5

## 2016-01-22 MED ORDER — ACETAMINOPHEN 325 MG RE SUPP: 325.0000 mg | RECTAL | Status: DC | PRN

## 2016-01-22 MED ORDER — POTASSIUM CHLORIDE CRYS ER 20 MEQ PO TBCR: 20.0000 meq | EXTENDED_RELEASE_TABLET | Freq: Every day | ORAL | Status: DC | PRN

## 2016-01-22 MED ORDER — SUGAMMADEX SODIUM 500 MG/5ML IV SOLN
INTRAVENOUS | Status: AC
  Filled 2016-01-22: qty 5

## 2016-01-22 MED ORDER — HEPARIN SODIUM (PORCINE) 1000 UNIT/ML IJ SOLN
INTRAMUSCULAR | Status: AC
  Filled 2016-01-22: qty 1

## 2016-01-22 MED ORDER — ONDANSETRON HCL 4 MG/2ML IJ SOLN: 4.0000 mg | Freq: Four times a day (QID) | INTRAMUSCULAR | Status: DC | PRN

## 2016-01-22 MED ORDER — ATENOLOL 25 MG PO TABS
25.0000 mg | ORAL_TABLET | Freq: Two times a day (BID) | ORAL | Status: DC
  Administered 2016-01-22 – 2016-01-24 (×5): 25 mg via ORAL
  Filled 2016-01-22 (×5): qty 1

## 2016-01-22 MED ORDER — PHENOL 1.4 % MT LIQD: 1.0000 | OROMUCOSAL | Status: DC | PRN

## 2016-01-22 MED ORDER — LIDOCAINE 2% (20 MG/ML) 5 ML SYRINGE
INTRAMUSCULAR | Status: AC
  Filled 2016-01-22: qty 5

## 2016-01-22 MED ORDER — ACETAMINOPHEN 325 MG PO TABS: 325.0000 mg | ORAL_TABLET | ORAL | Status: DC | PRN

## 2016-01-22 MED ORDER — NITROGLYCERIN 0.4 MG SL SUBL: 0.4000 mg | SUBLINGUAL_TABLET | SUBLINGUAL | Status: DC | PRN

## 2016-01-22 MED ORDER — PROTAMINE SULFATE 10 MG/ML IV SOLN
INTRAVENOUS | Status: DC | PRN
  Administered 2016-01-22 (×2): 25 mg via INTRAVENOUS

## 2016-01-22 MED ORDER — SUGAMMADEX SODIUM 200 MG/2ML IV SOLN
INTRAVENOUS | Status: DC | PRN
  Administered 2016-01-22: 400 mg via INTRAVENOUS

## 2016-01-22 MED ORDER — ALUM & MAG HYDROXIDE-SIMETH 200-200-20 MG/5ML PO SUSP: 15.0000 mL | ORAL | Status: DC | PRN

## 2016-01-22 MED ORDER — GABAPENTIN 300 MG PO CAPS: 300.0000 mg | ORAL_CAPSULE | Freq: Every day | ORAL | Status: DC | PRN

## 2016-01-22 MED ORDER — FAMOTIDINE 20 MG PO TABS: 20.0000 mg | ORAL_TABLET | Freq: Every day | ORAL | Status: DC | PRN

## 2016-01-22 MED ORDER — OXYCODONE HCL 5 MG/5ML PO SOLN: 5.0000 mg | Freq: Once | ORAL | Status: AC | PRN

## 2016-01-22 MED ORDER — PROPOFOL 10 MG/ML IV BOLUS
INTRAVENOUS | Status: DC | PRN
Start: 2016-01-22 — End: 2016-01-22
  Administered 2016-01-22: 130 mg via INTRAVENOUS

## 2016-01-22 MED ORDER — GUAIFENESIN-DM 100-10 MG/5ML PO SYRP: 15.0000 mL | ORAL_SOLUTION | ORAL | Status: DC | PRN

## 2016-01-22 MED ORDER — PHENYLEPHRINE HCL 10 MG/ML IJ SOLN
INTRAVENOUS | Status: DC | PRN
  Administered 2016-01-22: 20 ug/min via INTRAVENOUS

## 2016-01-22 MED ORDER — ROCURONIUM BROMIDE 100 MG/10ML IV SOLN
INTRAVENOUS | Status: DC | PRN
  Administered 2016-01-22: 50 mg via INTRAVENOUS
  Administered 2016-01-22: 20 mg via INTRAVENOUS
  Administered 2016-01-22: 30 mg via INTRAVENOUS

## 2016-01-22 MED ORDER — PROPOFOL 10 MG/ML IV BOLUS
INTRAVENOUS | Status: AC
  Filled 2016-01-22: qty 20

## 2016-01-22 MED ORDER — ONDANSETRON HCL 4 MG/2ML IJ SOLN
INTRAMUSCULAR | Status: AC
  Filled 2016-01-22: qty 2

## 2016-01-22 MED ORDER — CLOPIDOGREL BISULFATE 75 MG PO TABS: 75.0000 mg | ORAL_TABLET | Freq: Every evening | ORAL | Status: DC

## 2016-01-22 MED ORDER — PNEUMOCOCCAL VAC POLYVALENT 25 MCG/0.5ML IJ INJ: 0.5000 mL | INJECTION | INTRAMUSCULAR | Status: DC | PRN

## 2016-01-22 MED ORDER — TRAMADOL HCL 50 MG PO TABS
50.0000 mg | ORAL_TABLET | Freq: Four times a day (QID) | ORAL | 0 refills | Status: DC | PRN
Start: 2016-01-22 — End: 2016-04-14

## 2016-01-22 MED ORDER — ROSUVASTATIN CALCIUM 10 MG PO TABS
40.0000 mg | ORAL_TABLET | Freq: Every day | ORAL | Status: DC
  Administered 2016-01-22 – 2016-01-24 (×3): 40 mg via ORAL
  Filled 2016-01-22: qty 1
  Filled 2016-01-22: qty 4
  Filled 2016-01-22: qty 1

## 2016-01-22 MED ORDER — ALLOPURINOL 300 MG PO TABS
300.0000 mg | ORAL_TABLET | Freq: Every day | ORAL | Status: DC | PRN
Start: 2016-01-22 — End: 2016-03-23

## 2016-01-22 MED ORDER — HYDRALAZINE HCL 20 MG/ML IJ SOLN: 5.0000 mg | INTRAMUSCULAR | Status: DC | PRN

## 2016-01-22 MED ORDER — MIDAZOLAM HCL 5 MG/5ML IJ SOLN
INTRAMUSCULAR | Status: DC | PRN
  Administered 2016-01-22: 2 mg via INTRAVENOUS

## 2016-01-22 MED ORDER — LIDOCAINE HCL (CARDIAC) 20 MG/ML IV SOLN
INTRAVENOUS | Status: DC | PRN
Start: 2016-01-22 — End: 2016-01-22
  Administered 2016-01-22: 100 mg via INTRAVENOUS

## 2016-01-22 MED ORDER — MORPHINE SULFATE (PF) 2 MG/ML IV SOLN
2.0000 mg | INTRAVENOUS | Status: DC | PRN
  Administered 2016-01-22 – 2016-01-24 (×9): 2 mg via INTRAVENOUS
  Filled 2016-01-22 (×9): qty 1

## 2016-01-22 MED ORDER — SODIUM CHLORIDE 0.9 % IV SOLN: 500.0000 mL | Freq: Once | INTRAVENOUS | Status: DC | PRN

## 2016-01-22 MED ORDER — SODIUM CHLORIDE 0.9 % IV SOLN
INTRAVENOUS | Status: DC
  Administered 2016-01-22 – 2016-01-23 (×2): via INTRAVENOUS

## 2016-01-22 MED ORDER — FENTANYL CITRATE (PF) 100 MCG/2ML IJ SOLN
INTRAMUSCULAR | Status: AC
  Filled 2016-01-22: qty 2

## 2016-01-22 MED ORDER — FENTANYL CITRATE (PF) 100 MCG/2ML IJ SOLN
25.0000 ug | INTRAMUSCULAR | Status: DC | PRN
  Administered 2016-01-22 (×3): 50 ug via INTRAVENOUS

## 2016-01-22 MED ORDER — PHENYLEPHRINE 40 MCG/ML (10ML) SYRINGE FOR IV PUSH (FOR BLOOD PRESSURE SUPPORT)
PREFILLED_SYRINGE | INTRAVENOUS | Status: AC
  Filled 2016-01-22: qty 10

## 2016-01-22 MED ORDER — OXYCODONE HCL 5 MG PO TABS
5.0000 mg | ORAL_TABLET | Freq: Once | ORAL | Status: AC | PRN
  Administered 2016-01-22: 5 mg via ORAL

## 2016-01-22 MED ORDER — CLOPIDOGREL BISULFATE 75 MG PO TABS
75.0000 mg | ORAL_TABLET | Freq: Every evening | ORAL | Status: DC
  Administered 2016-01-23: 75 mg via ORAL
  Filled 2016-01-22: qty 1

## 2016-01-22 MED ORDER — LACTATED RINGERS IV SOLN
INTRAVENOUS | Status: DC | PRN
  Administered 2016-01-22 (×2): via INTRAVENOUS

## 2016-01-22 MED ORDER — IODIXANOL 320 MG/ML IV SOLN
INTRAVENOUS | Status: DC | PRN
  Administered 2016-01-22: 90.9 mL via INTRAVENOUS

## 2016-01-22 MED ORDER — MIDAZOLAM HCL 2 MG/2ML IJ SOLN
INTRAMUSCULAR | Status: AC
  Filled 2016-01-22: qty 2

## 2016-01-22 MED ORDER — FENTANYL CITRATE (PF) 100 MCG/2ML IJ SOLN
INTRAMUSCULAR | Status: DC | PRN
Start: 2016-01-22 — End: 2016-01-22
  Administered 2016-01-22: 50 ug via INTRAVENOUS
  Administered 2016-01-22: 25 ug via INTRAVENOUS
  Administered 2016-01-22: 100 ug via INTRAVENOUS
  Administered 2016-01-22: 25 ug via INTRAVENOUS

## 2016-01-22 MED ORDER — ALLOPURINOL 300 MG PO TABS: 300.0000 mg | ORAL_TABLET | Freq: Every day | ORAL | Status: DC | PRN

## 2016-01-22 MED ORDER — DOCUSATE SODIUM 100 MG PO CAPS
100.0000 mg | ORAL_CAPSULE | Freq: Every day | ORAL | Status: DC
  Administered 2016-01-23 – 2016-01-24 (×2): 100 mg via ORAL
  Filled 2016-01-22 (×2): qty 1

## 2016-01-22 MED ORDER — SODIUM CHLORIDE 0.9 % IV SOLN: INTRAVENOUS | Status: DC

## 2016-01-22 MED ORDER — SODIUM CHLORIDE 0.9 % IV SOLN
INTRAVENOUS | Status: DC | PRN
  Administered 2016-01-22: 500 mL

## 2016-01-22 SURGICAL SUPPLY — 88 items
ADH SKN CLS APL DERMABOND .7 (GAUZE/BANDAGES/DRESSINGS) ×1
ADH SKN CLS LQ APL DERMABOND (GAUZE/BANDAGES/DRESSINGS) ×1
BAG DECANTER FOR FLEXI CONT (MISCELLANEOUS) IMPLANT
BAG SNAP BAND KOVER 36X36 (MISCELLANEOUS) ×1 IMPLANT
BALLN CODA OCL 2-9.0-35-120-3 (BALLOONS)
BALLOON COD OCL 2-9.0-35-120-3 (BALLOONS) IMPLANT
CANISTER SUCTION 2500CC (MISCELLANEOUS) ×2 IMPLANT
CANNULA VESSEL 3MM 2 BLNT TIP (CANNULA) IMPLANT
CATH ANGIO 5F BER2 65CM (CATHETERS) ×1 IMPLANT
CATH OMNI FLUSH .035X70CM (CATHETERS) ×1 IMPLANT
CLIP LIGATING EXTRA MED SLVR (CLIP) IMPLANT
CLIP LIGATING EXTRA SM BLUE (MISCELLANEOUS) IMPLANT
COVER MAYO STAND STRL (DRAPES) ×2 IMPLANT
DERMABOND ADHESIVE PROPEN (GAUZE/BANDAGES/DRESSINGS) ×1
DERMABOND ADVANCED (GAUZE/BANDAGES/DRESSINGS) ×1
DERMABOND ADVANCED .7 DNX12 (GAUZE/BANDAGES/DRESSINGS) ×1 IMPLANT
DERMABOND ADVANCED .7 DNX6 (GAUZE/BANDAGES/DRESSINGS) IMPLANT
DEVICE CLOSURE PERCLS PRGLD 6F (VASCULAR PRODUCTS) IMPLANT
DEVICE TORQUE KENDALL .025-038 (MISCELLANEOUS) ×1 IMPLANT
DRAIN CHANNEL 10F 3/8 F FF (DRAIN) IMPLANT
DRAIN CHANNEL 10M FLAT 3/4 FLT (DRAIN) IMPLANT
DRAPE CV SPLIT W-CLR ANES SCRN (DRAPES) ×1 IMPLANT
DRAPE ORTHO SPLIT 77X108 STRL (DRAPES) ×2
DRAPE SURG ORHT 6 SPLT 77X108 (DRAPES) IMPLANT
DRAPE TABLE COVER HEAVY DUTY (DRAPES) ×2 IMPLANT
DRSG TEGADERM 2-3/8X2-3/4 SM (GAUZE/BANDAGES/DRESSINGS) ×2 IMPLANT
DRYSEAL FLEXSHEATH 12FR 33CM (SHEATH) ×1
DRYSEAL FLEXSHEATH 18FR 33CM (SHEATH) ×1
ELECT CAUTERY BLADE 6.4 (BLADE) ×2 IMPLANT
ELECT REM PT RETURN 9FT ADLT (ELECTROSURGICAL) ×4
ELECTRODE REM PT RTRN 9FT ADLT (ELECTROSURGICAL) ×2 IMPLANT
EVACUATOR 3/16  PVC DRAIN (DRAIN)
EVACUATOR 3/16 PVC DRAIN (DRAIN) IMPLANT
EVACUATOR SILICONE 100CC (DRAIN) IMPLANT
EXCLUDER TNK LEG 28MX14X16 (Endovascular Graft) IMPLANT
EXCLUDER TRUNK LEG 28MX14X16 (Endovascular Graft) ×2 IMPLANT
GAUZE SPONGE 2X2 8PLY STRL LF (GAUZE/BANDAGES/DRESSINGS) IMPLANT
GLOVE BIO SURGEON STRL SZ 6.5 (GLOVE) ×2 IMPLANT
GLOVE BIOGEL PI IND STRL 6.5 (GLOVE) IMPLANT
GLOVE BIOGEL PI IND STRL 7.0 (GLOVE) IMPLANT
GLOVE BIOGEL PI INDICATOR 6.5 (GLOVE) ×2
GLOVE BIOGEL PI INDICATOR 7.0 (GLOVE) ×3
GLOVE ECLIPSE 6.5 STRL STRAW (GLOVE) ×1 IMPLANT
GLOVE SS BIOGEL STRL SZ 7.5 (GLOVE) ×1 IMPLANT
GLOVE SUPERSENSE BIOGEL SZ 7.5 (GLOVE) ×1
GOWN STRL REUS W/ TWL LRG LVL3 (GOWN DISPOSABLE) ×3 IMPLANT
GOWN STRL REUS W/TWL LRG LVL3 (GOWN DISPOSABLE) ×12
GRAFT BALLN CATH 65CM (STENTS) IMPLANT
GRAFT EXCLUDER AORTIC 28X3.3CM (Endovascular Graft) ×1 IMPLANT
GUIDEWIRE ANGLED .035X150CM (WIRE) ×1 IMPLANT
INSERT FOGARTY 61MM (MISCELLANEOUS) IMPLANT
INSERT FOGARTY SM (MISCELLANEOUS) IMPLANT
KIT BASIN OR (CUSTOM PROCEDURE TRAY) ×2 IMPLANT
KIT ROOM TURNOVER OR (KITS) ×2 IMPLANT
LEG CONTRALATERAL 16X12X12 (Vascular Products) ×1 IMPLANT
LIQUID BAND (GAUZE/BANDAGES/DRESSINGS) ×1 IMPLANT
NDL PERC 18GX7CM (NEEDLE) ×1 IMPLANT
NEEDLE PERC 18GX7CM (NEEDLE) ×2 IMPLANT
NS IRRIG 1000ML POUR BTL (IV SOLUTION) ×3 IMPLANT
PACK ENDOVASCULAR (PACKS) ×2 IMPLANT
PAD ARMBOARD 7.5X6 YLW CONV (MISCELLANEOUS) ×4 IMPLANT
PENCIL BUTTON HOLSTER BLD 10FT (ELECTRODE) ×2 IMPLANT
PERCLOSE PROGLIDE 6F (VASCULAR PRODUCTS) ×8
SHEATH AVANTI 11CM 8FR (MISCELLANEOUS) ×1 IMPLANT
SHEATH BRITE TIP 8FR 23CM (MISCELLANEOUS) ×1 IMPLANT
SHEATH DRYSEAL FLEX 12FR 33CM (SHEATH) IMPLANT
SHEATH DRYSEAL FLEX 18FR 33CM (SHEATH) IMPLANT
SPONGE GAUZE 2X2 STER 10/PKG (GAUZE/BANDAGES/DRESSINGS) ×1
STAPLER VISISTAT 35W (STAPLE) IMPLANT
STENT GRAFT BALLN CATH 65CM (STENTS) ×1
STOPCOCK MORSE 400PSI 3WAY (MISCELLANEOUS) ×2 IMPLANT
SUT ETHILON 3 0 PS 1 (SUTURE) IMPLANT
SUT PROLENE 5 0 C 1 24 (SUTURE) IMPLANT
SUT VIC AB 2-0 CTX 36 (SUTURE) IMPLANT
SUT VIC AB 3-0 SH 27 (SUTURE) ×4
SUT VIC AB 3-0 SH 27X BRD (SUTURE) IMPLANT
SYR 20CC LL (SYRINGE) ×3 IMPLANT
SYR 30ML LL (SYRINGE) ×1 IMPLANT
SYR 5ML LL (SYRINGE) ×2 IMPLANT
SYR MEDRAD MARK V 150ML (SYRINGE) IMPLANT
SYRINGE 10CC LL (SYRINGE) ×3 IMPLANT
TAPE CLOTH SURG 4X10 WHT LF (GAUZE/BANDAGES/DRESSINGS) ×1 IMPLANT
TRAY FOLEY W/METER SILVER 16FR (SET/KITS/TRAYS/PACK) ×2 IMPLANT
TUBE CONNECTING 12X1/4 (SUCTIONS) ×1 IMPLANT
TUBING HIGH PRESSURE 120CM (CONNECTOR) ×1 IMPLANT
WATER STERILE IRR 1000ML POUR (IV SOLUTION) ×2 IMPLANT
WIRE AMPLATZ SS-J .035X180CM (WIRE) ×2 IMPLANT
WIRE BENTSON .035X145CM (WIRE) ×2 IMPLANT

## 2016-01-22 NOTE — Anesthesia Procedure Notes (Signed)
Procedure Name: Intubation Date/Time: 01/22/2016 7:59 AM Performed by: Ollen Bowl Pre-anesthesia Checklist: Patient identified, Emergency Drugs available, Suction available, Patient being monitored and Timeout performed Patient Re-evaluated:Patient Re-evaluated prior to inductionOxygen Delivery Method: Circle system utilized and Simple face mask Preoxygenation: Pre-oxygenation with 100% oxygen Intubation Type: IV induction Ventilation: Mask ventilation without difficulty Laryngoscope Size: Miller and 2 Grade View: Grade II Tube type: Oral Tube size: 7.5 mm Number of attempts: 1 Airway Equipment and Method: Patient positioned with wedge pillow and Stylet Placement Confirmation: ETT inserted through vocal cords under direct vision,  positive ETCO2 and breath sounds checked- equal and bilateral Secured at: 21 cm Tube secured with: Tape Dental Injury: Teeth and Oropharynx as per pre-operative assessment

## 2016-01-22 NOTE — Progress Notes (Signed)
Report given to robin roberts rn as caregiver 

## 2016-01-22 NOTE — H&P (Signed)
Office Visit   12/08/2015 [facility] -Brandi Eaton Nickel, NP  Vascular Surgery   AAA (abdominal aortic aneurysm) without rupture ([place]) +1 more  Dx   Re-evaluation ; Referred by Brandi Borg, MD  Reason for Visit   Additional Documentation   Vitals:   BP 108/77 (BP Location: Left Arm, Patient Position: Sitting, Cuff Size: Normal)   Pulse 84   Temp 97.8 F (36.6 C) (Oral)   Resp 20   Ht 5\' 5"  (1.651 m)   Wt 221 lb 8 oz (100.5 kg)   SpO2 96%   BMI 36.86 kg/m   BSA 2.15 m      More Vitals   Flowsheets:   Infectious Disease Screening,   Healthcare Directives,   Amb Nursing Assessment,   Custom Formula Data,   Anthropometrics,   Vital Signs,   MEWS Score     Encounter Info:   Billing Info,   History,   Allergies,   Detailed Report     All Notes   Progress Notes by Brandi Fish, NP at 12/08/2015 10:15 AM   Author: Viann Fish, NP Author Type: Nurse Practitioner Filed: 12/08/2015 11:14 AM  Note Status: Signed Cosign: Cosign Not Required Encounter Date: 12/08/2015 10:15 AM  Editor: Brandi Leyden Nickel, NP (Nurse Practitioner)    [facility] [place]   CC: Follow up Abdominal Aortic Aneurysm  History of Present Illness  [name] is a 71 y.o. (05-03-1945) female patient of Dr. Donnetta [name] who returns today for continued followup of known infrarenal abdominal aortic aneurysm. Previous studies demonstrate an AAA, measuring 4.7 cm.   She does have some chronic degenerative disc disease with back pain, with recent exacerbation, relieved by sitting, but no new abdominal pain. She has recievedESI's for lumbar spine issues, had lumbar spine surgery.   The patient does not seem to have claudication in legs with walking, denies non healing wounds. The patient denies history of stroke or TIA symptoms.  October 2017 she had 3 more cardiac stents placed, started Brilinta then.  Pt states she was prescribed  Crestor, but she is not taking; pt states she told her cardiologist whom she states advised her to take.  Pt Diabetic: Yes, A1C March 2017 was 6.6, review of records Pt smoker: she states she quit smoking at her first MI; but at her February 2017 visit she reported that she smoked when she felt stressed.        Past Medical History:  Diagnosis Date  . AAA (abdominal aortic aneurysm) ([place])    a. 4.7cm in 08/2012. Followed by [facility].  . Allergic rhinitis, cause unspecified   . Anxiety state, unspecified   . Chronic LBP   . Coronary artery disease    a. s/p inferior MI 1998->PCI of RCA. b. S/p prior PCI of the OM1 and LAD;  c. 05/2013 Cath/PCI: LM nl, LAD 90p (3.0x22 Resolute DES), 67m w/ patent stent, Diag 40p, 47m, LCX patent OM1 stent, RCA 50p, 43m, EF 65%. d. 10/12 PCI with DESx3 to RCA  . Degeneration of lumbar or lumbosacral intervertebral disc   . Depressive disorder, not elsewhere classified   . Diabetes mellitus    TYPE II  . Gout 08/22/2013  . Hyperlipidemia   . Hypertension   . Hypothyroidism   . Lower GI bleed 06/2010   Diverticular bleed  . Noncompliance with medications 02/26/2014  . Obesity, unspecified         Past  Surgical History:  Procedure Laterality Date  . CARDIAC CATHETERIZATION     PCI OF BOTH THE CIRCUMFLEX AND LEFT ANTERIOR DESCENDING ARTERY  . CARDIAC CATHETERIZATION N/A 10/29/2015   Procedure: Coronary Stent Intervention;  Surgeon: Nelva Bush, MD;  Location: [facility];  Service: Cardiovascular;  Laterality: N/A;  . CARDIAC CATHETERIZATION N/A 10/29/2015   Procedure: Coronary/Graft Angiography;  Surgeon: Nelva Bush, MD;  Location: [facility];  Service: Cardiovascular;  Laterality: N/A;  . CARDIAC CATHETERIZATION N/A 10/29/2015   Procedure: Intravascular Pressure Wire/FFR Study;  Surgeon: Nelva Bush, MD;  Location: [facility];  Service: Cardiovascular;  Laterality: N/A;  . CESAREAN SECTION      . HEART STENT  04-2010  and  Jun 07, 2013   X 3  . LEFT HEART CATHETERIZATION WITH CORONARY ANGIOGRAM N/A 06/07/2013   Procedure: LEFT HEART CATHETERIZATION WITH CORONARY ANGIOGRAM;  Surgeon: [name], MD;  Location: [facility];  Service: Cardiovascular;  Laterality: N/A;  . LEFT HEART CATHETERIZATION WITH CORONARY ANGIOGRAM N/A 02/25/2014   Procedure: LEFT HEART CATHETERIZATION WITH CORONARY ANGIOGRAM;  Surgeon: Troy Sine, MD;  Location: [facility];  Service: Cardiovascular;  Laterality: N/A;  . LUMBAR FUSION  01/2007   DR. Patrice [name]...3-LEVEL WITH FIXATION  . OOPHORECTOMY     BSO? pt.unsure  . PARATHYROIDECTOMY    . SPINE SURGERY    . THYROIDECTOMY    . TOTAL ABDOMINAL HYSTERECTOMY     Social History Social History   Social History  . Marital status: Divorced    Spouse name: N/A  . Number of children: 3  . Years of education: N/A        Occupational History  . [employer]    ADMIN SUPPORT  .  Retired         Social History Main Topics  . Smoking status: Former Smoker    Years: 30.00    Types: Cigarettes    Quit date: 05/31/1986  . Smokeless tobacco: Never Used  . Alcohol use No  . Drug use: No  . Sexual activity: Not Currently     Comment: 1st intercourse 71 yo-Fewer than 5 partners       Other Topics Concern  . Not on file      Social History Narrative   DIVORCED   3 CHILDREN   PATIENT SIGNED A DESIGNATED PARTY RELEASE TO ALLOW HER DAUGHTER, [name], TO HAVE ACCESS TO HER MEDICAL RECORDS/INFORMATION. [name], May 04, 2009 @ 3:27 PM   Smokes cigarettes on rare occasions.   Family History       Family History  Problem Relation Age of Onset  . Heart attack Mother 26    s/p D&C-CARDIAC ARREST 1966  . Heart disease Mother   . Heart attack Father 74    1978 WITH MI  . Heart disease Father   . Diabetes Brother   . Diabetes Maternal Aunt   . Arthritis Maternal Aunt    . Breast cancer Maternal Aunt     Post menopausal  . Colon cancer Neg Hx           Current Outpatient Prescriptions on File Prior to Visit  Medication Sig Dispense Refill  . acetaminophen (TYLENOL) 325 MG tablet Take 2 tablets (650 mg total) by mouth every 4 (four) hours as needed for headache or mild pain.    [name] allopurinol (ZYLOPRIM) 300 MG tablet Take 1 tablet (300 mg total) by mouth  daily. 30 tablet 6  . aspirin 81 MG EC tablet Take 81 mg by mouth daily.     [name] atenolol (TENORMIN) 25 MG tablet Take 1 tablet (25 mg total) by mouth 2 (two) times daily. 180 tablet 3  . famotidine (PEPCID) 20 MG tablet Take 1 tablet (20 mg total) by mouth daily. 30 tablet 6  . gabapentin (NEURONTIN) 300 MG capsule 1-2 tab by mouth at bedtime 180 capsule 1  . metFORMIN (GLUCOPHAGE-XR) 500 MG 24 hr tablet TAKE 4 TABLETS BY MOUTH EVERY MORNING 360 tablet 0  . nitroGLYCERIN (NITROSTAT) 0.4 MG SL tablet Place 1 tablet (0.4 mg total) under the tongue every 5 (five) minutes as needed for chest pain. 25 tablet 3  . rosuvastatin (CRESTOR) 20 MG tablet Take 1 tablet (20 mg total) by mouth daily. 30 tablet 6  . SYNTHROID 150 MCG tablet TAKE 1 TABLET BY MOUTH EVERY DAY 90 tablet 1  . ticagrelor (BRILINTA) 90 MG TABS tablet Take 1 tablet (90 mg total) by mouth 2 (two) times daily. 60 tablet 11  . traMADol (ULTRAM) 50 MG tablet Take 50 mg by mouth every 6 (six) hours as needed for moderate pain.    . Vitamin D, Ergocalciferol, (DRISDOL) 50000 units CAPS capsule Take 1 capsule (50,000 Units total) by mouth every 7 (seven) days. 12 capsule 0   No current facility-administered medications on file prior to visit.         Allergies  Allergen Reactions  . Prilosec [Omeprazole] Other (See Comments)    Chest pain  . Miconazole Nitrate Hives    REACTION: hives  . Augmentin [Amoxicillin-Pot Clavulanate] Hives, Itching and Rash  . Doxycycline Other (See Comments)    REACTION: gi upset    ROS: See HPI  for pertinent positives and negatives.  Physical Examination     Vitals:   12/08/15 0946  BP: 108/77  Pulse: 84  Resp: 20  Temp: 97.8 F (36.6 C)  TempSrc: Oral  SpO2: 96%  Weight: 221 lb 8 oz (100.5 kg)  Height: 5\' 5"  (1.651 m)   Body mass index is 36.86 kg/m.  General: A&O x 3, WD, obese female.  Pulmonary: Sym exp, respirations are non labored, fairair movt, no rales, rhonchi, or wheezing.  Cardiac: RRR, Nl S1, S2, no detected murmur.   Carotid Bruits Left Right   Negative Negative  Aorta is not palpable Radial pulses are 2+ palpable and =   VASCULAR EXAM:     LE Pulses LEFT RIGHT   FEMORAL not palpable not palpable    POPLITEAL not palpable  not palpable   POSTERIOR TIBIAL faintly palpable  faintly palpable    DORSALIS PEDIS  ANTERIOR TIBIAL 1+ palpable  1+ palpable     Gastrointestinal: soft, NTND, -G/R, - HSM, - palpable masses, - CVAT B.  Musculoskeletal: M/S 4/5 throughout, Extremities without ischemic changes. No peripheral edema.  Neurologic: CN 2-12 intact, Pain and light touch intact in extremities are intact except, Motor exam as listed above.    CTA Abd/pelvis 11-09-15, requested by another provider to evaluate right LQ abdominal pain: Atherosclerotic aorta. Infrarenal abdominal aortic aneurysm measures 5.4 x 4.5 cm, compared with 4.5 x 4.9 cm previously. Thrombus in the aneurysm sac. Atherosclerotic disease in the iliac arteries bilaterally. No adenopathy.   Non-Invasive Vascular Imaging  AAA Duplex (12/08/2015)  Previous size: 4.9 cm (Date: 09-03-15)  Current size:  5.4 cm by CTA abd/pelvis (Date: 11-09-15)  Medical Decision Making  The patient is a  71 y.o. female  who presents with asymptomatic AAA with increase in size to 5.4 cm by CTA abd/pelvis last month.  The patient was counseled re smoking cessation and given several free resources re smoking cessation.   Serum creatinine was 0.81 on 10-14- 2017 (review of records).   Dr. Donnetta [name] viewed the CTA abd/pelvis images from 11-09-15; he then spoke with and examined the pt.   Based on this patient's exam and diagnostic studies, the patient will be scheduled for EVAR in [place] January 2018 by Dr. Donnetta [name].  Consideration for repair of AAA would be made when the size is 5.0 cm, growth > 1 cm/yr, and symptomatic status.  I emphasized the importance of maximal medical management including strict control of blood pressure, blood glucose, and lipid levels, antiplatelet agents, obtaining regular exercise, and cessation of smoking.   The patient was given information about AAA including signs, symptoms, treatment, and how to minimize the risk of enlargement and rupture of aneurysms.    The patient was advised to call 911 should the patient experience sudden onset abdominal or back pain.   Thank you for allowing [place] to participate in this patient's care.  Clemon Chambers, RN, MSN, FNP-C [facility] [place] Office: [phone]  Clinic Physician: Naeemah Jasmer  12/08/2015, 9:50 AM        Patient Instructions by Brandi Fish, NP at 12/08/2015 10:15 AM   Author: Viann Fish, NP Author Type: Nurse Practitioner Filed: 12/08/2015 9:39 AM  Note Status: Addendum Cosign: Cosign Not Required Encounter Date: 12/08/2015 10:15 AM  Editor: Brandi Leyden Nickel, NP (Nurse Practitioner)  Prior Versions: 1. Brandi Leyden Nickel, NP (Nurse Practitioner) at 12/08/2015 9:38 AM - Signed    Abdominal Aortic Aneurysm  Blood pumps away from the heart through tubes (blood vessels) called arteries. Aneurysms are weak or damaged places in the wall of an artery. It bulges out like a balloon. An  abdominal aortic aneurysm happens in the main artery of the body (aorta). It can burst or tear, causing bleeding inside the body. This is an emergency. It needs treatment right away. What are the causes? The exact cause is unknown. Things that could cause this problem include:  Fat and other substances building up in the lining of a tube.  Swelling of the walls of a blood vessel.  Certain tissue diseases.  Belly (abdominal) trauma.  An infection in the main artery of the body. What increases the risk? There are things that make it more likely for you to have an aneurysm. These include:  Being over the age of 71 years old.  Having high blood pressure (hypertension).  Being a female.  Being white.  Being very overweight (obese).  Having a family history of aneurysm.  Using tobacco products. What are the signs or symptoms? Symptoms depend on the size of the aneurysm and how fast it grows. There may not be symptoms. If symptoms occur, they can include:  Pain (belly, side, lower back, or groin).  Feeling full after eating a small amount of food.  Feeling sick to your stomach (nauseous), throwing up (vomiting), or both.  Feeling a lump in your belly that feels like it is beating (pulsating).  Feeling like you will pass out (faint). How is this treated?  Medicine to control blood pressure and pain.  Imaging tests to see if the aneurysm gets bigger.  Surgery. How is this prevented? To lessen your chance of getting this condition:  Stop smoking. Stop chewing  tobacco.  Limit or avoid alcohol.  Keep your blood pressure, blood sugar, and cholesterol within normal limits.  Eat less salt.  Eat foods low in saturated fats and cholesterol. These are found in animal and whole dairy products.  Eat more fiber. Fiber is found in whole grains, vegetables, and fruits.  Keep a healthy weight.  Stay active and exercise often. This information is not intended to replace advice  given to you by your health care provider. Make sure you discuss any questions you have with your health care provider. Document Released: 04/30/2012 Document Revised: 06/11/2015 Document Reviewed: 02/03/2012 Elsevier Interactive Patient Education  2017 [employer].      Steps to Quit Smoking  Smoking tobacco can be bad for your health. It can also affect almost every organ in your body. Smoking puts you and people around you at risk for many serious long-lasting (chronic) diseases. Quitting smoking is hard, but it is one of the best things that you can do for your health. It is never too late to quit. What are the benefits of quitting smoking? When you quit smoking, you lower your risk for getting serious diseases and conditions. They can include:  Lung cancer or lung disease.  Heart disease.  Stroke.  Heart attack.  Not being able to have children (infertility).  Weak bones (osteoporosis) and broken bones (fractures). If you have coughing, wheezing, and shortness of breath, those symptoms may get better when you quit. You may also get sick less often. If you are pregnant, quitting smoking can help to lower your chances of having a baby of low birth weight. What can I do to help me quit smoking? Talk with your doctor about what can help you quit smoking. Some things you can do (strategies) include:  Quitting smoking totally, instead of slowly cutting back how much you smoke over a period of time.  Going to in-person counseling. You are more likely to quit if you go to many counseling sessions.  Using resources and support systems, such as: ? Database administrator with a Social worker. ? Phone quitlines. ? Careers information officer. ? Support groups or group counseling. ? Text messaging programs. ? Mobile phone apps or applications.  Taking medicines. Some of these medicines may have nicotine in them. If you are pregnant or breastfeeding, do not take any medicines to quit smoking  unless your doctor says it is okay. Talk with your doctor about counseling or other things that can help you. Talk with your doctor about using more than one strategy at the same time, such as taking medicines while you are also going to in-person counseling. This can help make quitting easier. What things can I do to make it easier to quit? Quitting smoking might feel very hard at first, but there is a lot that you can do to make it easier. Take these steps:  Talk to your family and friends. Ask them to support and encourage you.  Call phone quitlines, reach out to support groups, or work with a Social worker.  Ask people who smoke to not smoke around you.  Avoid places that make you want (trigger) to smoke, such as: ? Bars. ? Parties. ? Smoke-break areas at work.  Spend time with people who do not smoke.  Lower the stress in your life. Stress can make you want to smoke. Try these things to help your stress: ? Getting regular exercise. ? Deep-breathing exercises. ? Yoga. ? Meditating. ? Doing a body scan. To do this,  close your eyes, focus on one area of your body at a time from head to toe, and notice which parts of your body are tense. Try to relax the muscles in those areas.  Download or buy apps on your mobile phone or tablet that can help you stick to your quit plan. There are many free apps, such as QuitGuide from the [employer] Office manager for Disease Control and Prevention). You can find more support from smokefree.gov and other websites. This information is not intended to replace advice given to you by your health care provider. Make sure you discuss any questions you have with your health care provider. Document Released: 10/30/2008 Document Revised: 09/01/2015 Document Reviewed: 05/20/2014 Elsevier Interactive Patient Education  2017 [employer].     Instructions   Patient Instructions    Being a female. Being white. Being very overweight (Smoke-break areas at work.       Addendum:  The patient has been re-examined and re-evaluated.  The patient's history and physical has been reviewed and is unchanged.    [name] is a 71 y.o. female is being admitted with Abdominal aortic aneurysm I71.4. All the risks, benefits and other treatment options have been discussed with the patient. The patient has consented to proceed with Procedure(s): ENDOVASCULAR STENT GRAFT INSERTION as a surgical intervention.  Curt Jews 01/22/2016 5:57 AM [facility]

## 2016-01-22 NOTE — Progress Notes (Signed)
  Day of Surgery Note    Subjective:  Sleepy but awakes to voice  Vitals:   01/22/16 0557 01/22/16 1048  BP: 131/78 137/80  Pulse: 87 84  Resp: 20 20  Temp: 98.5 F (36.9 C) 97.5 F (36.4 C)    Incisions:   Bilateral groins are soft without hematoma Extremities:  Easily palpable DP pulses bilaterally Cardiac:  regular Lungs:  Non labored Abdomen:  Soft, NT/ND   Assessment/Plan:  This is a 71 y.o. female who is s/p EVAR  -pt doing well in pacu with easily palpable PT pulses bilaterally -bilateral groins are soft without hematoma -IVF @ 100cc/hr -pt on Plavix-restart tomorrow -to [facility] when bed available -anticipate discharge in 1-2 days   Leontine Locket, PA-C 01/22/2016 11:00 AM [phone]

## 2016-01-22 NOTE — Op Note (Signed)
OPERATIVE REPORT  DATE OF SURGERY: 01/22/2016  PATIENT: [name], 71 y.o. female MRN: [id number]  DOB: April 25, 1945  PRE-OPERATIVE DIAGNOSIS: Abdominal aortic aneurysm  POST-OPERATIVE DIAGNOSIS:  Same  PROCEDURE: Simeon Craft excluder stent graft repair of abdominal aortic aneurysm  SURGEON:  Curt Jews, M.D.  PHYSICIAN ASSISTANT: Samantha Rhyne PA-C  ANESTHESIA:  Gen.  EBL: 150 ml  Total I/O In: 1200 [I.V.:1200] Out: 1000 [Urine:850; Blood:150]  BLOOD ADMINISTERED: None  DRAINS: None  SPECIMEN: None  COUNTS CORRECT:  YES  PLAN OF CARE: PACU   PATIENT DISPOSITION:  PACU - hemodynamically stable  PROCEDURE DETAILS: Patient was taken to the operating placed supine position where the area the abdomen and both groins were prepped and draped in usual sterile fashion. Using SonoSite ultrasound the right common femoral artery was accessed with an 18-gauge needle and a guidewire was passed centrally and this was confirmed with fluoroscopy. A French sheath was passed over the guidewire. Next similar technique was used with SonoSite on the left groin and again the common femoral artery was accessed and a guidewire was passed centrally. 8 French sheath was passed over the guidewire. A guide catheter was used to pass the guidewires above the level renal arteries. This was required due to extensive tortuosity below the level the renal arteries of the native aorta. The Bentson wire was exchanged for Amplatz superstiff wire through the right and left groin. Scissors were made to place the main body of the device to the right groin and to use a crossed leg configuration. The main body was a 28 x 14 x 16 cm graft. The patient was given 7000 units intravenous heparin and after adequate circulation time the right groin 88 French sheath was exchanged for an 40 French dry cereal sheath.  A 12 French sheath was placed on the left groin. The main body was positioned at the level of the renal artery on  the left which was the lowest renal. A pigtail catheter was positioned above the level renal arteries and arteriogram revealed the level of the left renal artery takeoff. The main body portion of the graft was deployed just below this. The 12 French sheath was drawn down below the contralateral gate. A buddy wire was passed through the 12 [place] dry she'll sheath alongside the Amplatz superstiff wire. A Bernstein catheter was used with a Glidewire to cannulate the contralateral gate. The pigtail catheter was positioned in the main body and this was twisted to convert to confirm that this was in the device. The 12 French sheath was withdrawn down towards the pelvis and hand injection to the left femoral sheath revealed the takeoff of the internal iliac artery. Contralateral limb of 12 mm x 12 cm was chosen and was landed above the level of the hypogastric artery takeoff. The right groin sheath was withdrawn down to the level of the hypogastric artery and the right and hand injection showed the level of the hypogastric takeoff on the right. The ipsilateral limb on the main body was deployed above this level. 2 extensive tortuosity and due to angulation of the proximal attachment site incision was made to place a aortic cuff. A 28 x 3.3 cm cuff was brought onto the field and was positioned through the right groin to the level of the left renal artery. Hand-injection showed the level of this renal artery. This was deployed just below this. A 250 balloon was brought onto the field and all proximal and distal attachment sites and  both the proximal aortic cuff and the overlap of the contralateral contralateral limb were all gently dilated. The pigtail catheter was repositioned above the level device and a final arteriogram revealed excellent location of the device with good flow into both renal arteries and no evidence of endoleak. Both hypogastric arteries filled and were distal to the distal attachment site. The Amplatz  wires were exchanged for a Bentson wires. The patient had had 2 Perclose devices placed prior to placement of a [place] sheath. The groin sheaths were withdrawn and the prior placed Perclose devices were secured giving excellent hemostasis. The patient should have a good femoral pulses bilaterally. [place] single 40 septic or Vicryl suture was used for each puncture site in the groin. The patient had a sterile dressing was applied and a palpable pedal pulses and was transferred to the recovery room in stable condition   Rosetta Posner, M.D., [facility] 01/22/2016 1:56 PM

## 2016-01-22 NOTE — Anesthesia Preprocedure Evaluation (Addendum)
Anesthesia Evaluation  Patient identified by MRN, date of birth, ID band Patient awake    Reviewed: Allergy & Precautions, H&P , NPO status , Patient's Chart, lab work & pertinent test results  Airway Mallampati: II  TM Distance: >3 FB Neck ROM: full    Dental  (+) Teeth Intact, Dental Advisory Given   Pulmonary former smoker,    breath sounds clear to auscultation       Cardiovascular hypertension, + angina + CAD, + Cardiac Stents and + Peripheral Vascular Disease   Rhythm:regular Rate:Normal  cath (2015): normal EF   Neuro/Psych PSYCHIATRIC DISORDERS Anxiety Depression  Neuromuscular disease    GI/Hepatic   Endo/Other  diabetes, Type 2Hypothyroidism   Renal/GU      Musculoskeletal  (+) Arthritis ,   Abdominal   Peds  Hematology   Anesthesia Other Findings   Reproductive/Obstetrics                            Anesthesia Physical Anesthesia Plan  ASA: III  Anesthesia Plan: General   Post-op Pain Management:    Induction: Intravenous  Airway Management Planned: Oral ETT  Additional Equipment: Arterial line  Intra-op Plan:   Post-operative Plan: Extubation in OR  Informed Consent: I have reviewed the patients History and Physical, chart, labs and discussed the procedure including the risks, benefits and alternatives for the proposed anesthesia with the patient or authorized representative who has indicated his/her understanding and acceptance.     Plan Discussed with: CRNA, Anesthesiologist and Surgeon  Anesthesia Plan Comments:         Anesthesia Quick Evaluation

## 2016-01-22 NOTE — Care Management Note (Signed)
Case Management Note  Patient Details  Name: [name] MRN: [id number] Date of Birth: [birth year]  Subjective/Objective:    S/p EVAR, NCM will cont to follow for dc needs.                Action/Plan:   Expected Discharge Date:                  Expected Discharge Plan:  [place]  In-House Referral:     Discharge planning Services  CM Consult  Post Acute Care Choice:    Choice offered to:     DME Arranged:    DME Agency:     HH Arranged:    [place] Agency:     Status of Service:  In process, will continue to follow  If discussed at Long Length of Stay Meetings, dates discussed:    Additional Comments:  Zenon Mayo, RN 01/22/2016, 4:59 PM

## 2016-01-22 NOTE — Anesthesia Postprocedure Evaluation (Signed)
Anesthesia Post Note  Patient: [name]  Procedure(s) Performed: Procedure(s) (LRB): ABDOMINAL AORTIC ENDOVASCULAR STENT GRAFT INSERTION (N/A)  Patient location during evaluation: PACU Anesthesia Type: General Level of consciousness: awake and alert and patient cooperative Pain management: pain level controlled Vital Signs Assessment: post-procedure vital signs reviewed and stable Respiratory status: spontaneous breathing and respiratory function stable Cardiovascular status: stable Anesthetic complications: no       Last Vitals:  Vitals:   01/22/16 1200 01/22/16 1420  BP: 129/82 115/65  Pulse:  78  Resp: 12   Temp: 36.4 C     Last Pain:  Vitals:   01/22/16 1419  TempSrc:   PainSc: 10-Worst pain ever                 Burns Timson S

## 2016-01-22 NOTE — Transfer of Care (Signed)
Immediate Anesthesia Transfer of Care Note  Patient: [name]  Procedure(s) Performed: Procedure(s): ABDOMINAL AORTIC ENDOVASCULAR STENT GRAFT INSERTION (N/A)  Patient Location: PACU  Anesthesia Type:General  Level of Consciousness: awake and alert   Airway & Oxygen Therapy: Patient Spontanous Breathing and Patient connected to nasal cannula oxygen  Post-op Assessment: Report given to RN, Post -op Vital signs reviewed and stable and Patient moving all extremities  Post vital signs: Reviewed and stable  Last Vitals:  Vitals:   01/22/16 0557  BP: 131/78  Pulse: 87  Resp: 20  Temp: 36.9 C    Last Pain:  Vitals:   01/22/16 0557  TempSrc: Oral         Complications: No apparent anesthesia complications

## 2016-01-23 LAB — CBC
HCT: 36.1 % (ref 36.0–46.0)
Hemoglobin: 11.5 g/dL — ABNORMAL LOW (ref 12.0–15.0)
MCH: 28 pg (ref 26.0–34.0)
MCHC: 31.9 g/dL (ref 30.0–36.0)
MCV: 88 fL (ref 78.0–100.0)
PLATELETS: 225 10*3/uL (ref 150–400)
RBC: 4.1 MIL/uL (ref 3.87–5.11)
RDW: 15 % (ref 11.5–15.5)
WBC: 12.1 10*3/uL — ABNORMAL HIGH (ref 4.0–10.5)

## 2016-01-23 LAB — BASIC METABOLIC PANEL
Anion gap: 7 (ref 5–15)
BUN: 7 mg/dL (ref 6–20)
CO2: 26 mmol/L (ref 22–32)
Calcium: 8 mg/dL — ABNORMAL LOW (ref 8.9–10.3)
Chloride: 107 mmol/L (ref 101–111)
Creatinine, Ser: 0.85 mg/dL (ref 0.44–1.00)
GFR calc Af Amer: >60 mL/min (ref >60)
GLUCOSE: 120 mg/dL — AB (ref 65–99)
POTASSIUM: 3.9 mmol/L (ref 3.5–5.1)
Sodium: 140 mmol/L (ref 135–145)

## 2016-01-23 MED ORDER — OXYCODONE-ACETAMINOPHEN 5-325 MG PO TABS
1.0000 | ORAL_TABLET | ORAL | Status: DC | PRN
  Administered 2016-01-23 – 2016-01-24 (×3): 1 via ORAL
  Filled 2016-01-23 (×3): qty 1

## 2016-01-23 NOTE — Progress Notes (Signed)
Subjective: Interval History: none.. Reports groin soreness. Has not walked yet. Had Foley discontinued recently. No nausea or vomiting  Objective: Vital signs in last 24 hours: Temp:  [97.5 F (36.4 C)-98.6 F (37 C)] (P) 98 F (36.7 C) (01/06 0839) Pulse Rate:  [72-84] 72 (01/06 0422) Resp:  [10-20] 14 (01/06 0422) BP: (96-137)/(65-83) 115/70 (01/06 0422) SpO2:  [95 %-99 %] 95 % (01/06 0422) Arterial Line BP: (127-142)/(65-72) 129/69 (01/06 0422)  Intake/Output from previous day: 01/05 0701 - 01/06 0700 In: 2553.3 [P.O.:120; I.V.:2233.3; IV Piggyback:200] Out: 2775 [Urine:2625; Blood:150] Intake/Output this shift: Total I/O In: -  Out: 325 [Urine:325]  Groin without hematoma bilaterally. Mild soreness. Abdomen benign  Lab Results:  Recent Labs  01/22/16 1437 01/23/16 0453  WBC 8.7 12.1*  HGB 12.6 11.5*  HCT 38.8 36.1  PLT 240 225   BMET  Recent Labs  01/22/16 1437 01/23/16 0453  NA 138 140  K 4.4 3.9  CL 107 107  CO2 23 26  GLUCOSE 240* 120*  BUN 6 7  CREATININE 0.98 0.85  CALCIUM 8.3* 8.0*    Studies/Results: Dg Chest Port 1 View  Result Date: 01/22/2016 CLINICAL DATA:  Status post AAA repair using bifurcation graft. EXAM: PORTABLE CHEST 1 VIEW COMPARISON:  10/29/2015 FINDINGS: Heart size is enlarged. There is diffuse pulmonary vascular congestion. No airspace opacities. No pleural effusions. IMPRESSION: 1. Cardiac enlargement and pulmonary vascular congestion. Electronically Signed   By: Kerby Moors M.D.   On: 01/22/2016 11:45   Anti-infectives: Anti-infectives    Start     Dose/Rate Route Frequency Ordered Stop   01/22/16 1930  vancomycin (VANCOCIN) IVPB 1000 mg/200 mL premix     1,000 mg 200 mL/hr over 60 Minutes Intravenous Every 12 hours 01/22/16 1209 01/23/16 0852   01/22/16 0800  vancomycin (VANCOCIN) 1,500 mg in sodium chloride 0.9 % 500 mL IVPB     1,500 mg 250 mL/hr over 120 Minutes Intravenous To Surgery 01/21/16 1353 01/22/16 0750       Assessment/Plan: s/p Procedure(s): ABDOMINAL AORTIC ENDOVASCULAR STENT GRAFT INSERTION (N/A) Stable postop day 1. Request staying until tomorrow for assistance with mobility check feel is appropriate. Will transfer to [address] [place] with assistance. Plan home in the a.m.   LOS: 1 day   Curt Jews 01/23/2016, 8:59 AM

## 2016-01-23 NOTE — Progress Notes (Signed)
Patient to transfer to 2W34 report given to receiving nurse, all questions answered at this time.  Pt. VSS with no s/s of distress noted.  Patient stable at transfer.  

## 2016-01-24 NOTE — Progress Notes (Signed)
Subjective: Interval History: none.. Less soreness this morning. Has not done much walking.   Objective: Vital signs in last 24 hours: Temp:  [98.4 F (36.9 C)-98.7 F (37.1 C)] 98.5 F (36.9 C) (01/07 0348) Pulse Rate:  [66-72] 66 (01/07 0348) Resp:  [15-18] 18 (01/07 0348) BP: (102-117)/(56-68) 117/57 (01/07 0348) SpO2:  [98 %-100 %] 100 % (01/07 0348) Weight:  [230 lb 8 oz (104.6 kg)] 230 lb 8 oz (104.6 kg) (01/06 1601)  Intake/Output from previous day: 01/06 0701 - 01/07 0700 In: 1600 [P.O.:600; I.V.:1000] Out: 325 [Urine:325] Intake/Output this shift: No intake/output data recorded.  No bruising. Groins without hematoma. Abdomen benign.  Lab Results:  Recent Labs  01/22/16 1437 01/23/16 0453  WBC 8.7 12.1*  HGB 12.6 11.5*  HCT 38.8 36.1  PLT 240 225   BMET  Recent Labs  01/22/16 1437 01/23/16 0453  NA 138 140  K 4.4 3.9  CL 107 107  CO2 23 26  GLUCOSE 240* 120*  BUN 6 7  CREATININE 0.98 0.85  CALCIUM 8.3* 8.0*    Studies/Results: Dg Chest Port 1 View  Result Date: 01/22/2016 CLINICAL DATA:  Status post AAA repair using bifurcation graft. EXAM: PORTABLE CHEST 1 VIEW COMPARISON:  10/29/2015 FINDINGS: Heart size is enlarged. There is diffuse pulmonary vascular congestion. No airspace opacities. No pleural effusions. IMPRESSION: 1. Cardiac enlargement and pulmonary vascular congestion. Electronically Signed   By: Kerby Moors M.D.   On: 01/22/2016 11:45   Anti-infectives: Anti-infectives    Start     Dose/Rate Route Frequency Ordered Stop   01/22/16 1930  vancomycin (VANCOCIN) IVPB 1000 mg/200 mL premix     1,000 mg 200 mL/hr over 60 Minutes Intravenous Every 12 hours 01/22/16 1209 01/23/16 0852   01/22/16 0800  vancomycin (VANCOCIN) 1,500 mg in sodium chloride 0.9 % 500 mL IVPB     1,500 mg 250 mL/hr over 120 Minutes Intravenous To Surgery 01/21/16 1353 01/22/16 0750      Assessment/Plan: s/p Procedure(s): ABDOMINAL AORTIC ENDOVASCULAR STENT  GRAFT INSERTION (N/A) Stable overall. Plan discharged home today. Will follow-up in one month with CT angiogram   LOS: 2 days   [name] 01/24/2016, 10:05 AM

## 2016-01-24 NOTE — Progress Notes (Signed)
Rn went over discharge paperwork with patient. Patient verbalized understanding of discharge information and patient was able to teach back information. Patient stated that her daughter is staying at patient's house currently and that she will be the patient's ride home. Rn requested patient to call her daughter and find out when she may be by to pick patient up. Patient became upset and stated "I do know how to use a phone. I will call her." Patient has a visitor in room at the moment but visitor states they are a family friend. Patient IV and telemetry removed.

## 2016-01-25 ENCOUNTER — Other Ambulatory Visit: Payer: Self-pay | Admitting: *Deleted

## 2016-01-25 ENCOUNTER — Encounter (HOSPITAL_COMMUNITY): Payer: Self-pay | Admitting: Vascular Surgery

## 2016-01-25 DIAGNOSIS — I714 Abdominal aortic aneurysm, without rupture, unspecified: Secondary | ICD-10-CM

## 2016-01-25 DIAGNOSIS — Z48812 Encounter for surgical aftercare following surgery on the circulatory system: Secondary | ICD-10-CM

## 2016-01-28 ENCOUNTER — Ambulatory Visit: Payer: Medicare Other

## 2016-02-02 NOTE — Discharge Summary (Signed)
[facility] Discharge Summary  [name] [birth year] 71 y.o. female  [id number]  Admission Date: 01/22/2016  Discharge Date: 01/24/2016  Physician: Curt Jews, MD  Admission Diagnosis: Abdominal aortic aneurysm I71.4  HPI:   This is a 71 y.o. female patient of Dr. Donnetta Hutching who returns today for continued followup of known infrarenal abdominal aortic aneurysm. Previous studies demonstrate an AAA, measuring 4.7 cm.   She does have some chronic degenerative disc disease with back pain, with recent exacerbation, relieved by sitting, but no new abdominal pain. She has recievedESI's for lumbar spine issues, had lumbar spine surgery.   The patient does not seem to haveclaudication in legs with walking, denies non healing wounds. The patient denies history of stroke or TIA symptoms.  October 2017 she had 3 more cardiac stents placed, started Brilinta then.  Pt states she was prescribed Crestor, but she is not taking; pt states she told her cardiologist whom she states advised her to take.  Pt Diabetic: Yes, A1C March 2017 was 6.6, review of records Pt smoker: she states she quit smoking at her first MI; but at her February 2017 visit she reported that she smoked when she felt stressed.  Hospital Course:  The patient was admitted to the hospital and taken to the operating room on 01/22/2016 and underwent: Gore excluder stent graft repair of abdominal aortic aneurysm    The patient tolerated the procedure well and was transported to the PACU in stable condition.   She did well post-operatively. Her groins were without hematoma bilaterally. There was mild soreness there and her abdomen was benign. She was stable for discharge on POD 1, but requested to stay another day for mobility. She was transferred to the floor and discharged home on POD 2.    CBC    Component Value Date/Time   WBC 12.1 (H) 01/23/2016 0453   RBC 4.10 01/23/2016 0453   HGB 11.5 (L)  01/23/2016 0453   HCT 36.1 01/23/2016 0453   PLT 225 01/23/2016 0453   MCV 88.0 01/23/2016 0453   MCH 28.0 01/23/2016 0453   MCHC 31.9 01/23/2016 0453   RDW 15.0 01/23/2016 0453   LYMPHSABS 2.2 11/06/2015 1544   MONOABS 0.6 11/06/2015 1544   EOSABS 0.1 11/06/2015 1544   BASOSABS 0.0 11/06/2015 1544    BMET    Component Value Date/Time   NA 140 01/23/2016 0453   K 3.9 01/23/2016 0453   CL 107 01/23/2016 0453   CO2 26 01/23/2016 0453   GLUCOSE 120 (H) 01/23/2016 0453   GLUCOSE 166 (H) 01/02/2006 0825   BUN 7 01/23/2016 0453   CREATININE 0.85 01/23/2016 0453   CALCIUM 8.0 (L) 01/23/2016 0453   GFRNONAA >60 01/23/2016 0453   GFRAA >60 01/23/2016 0453     Discharge Instructions:   The patient is discharged to home with extensive instructions on wound care and progressive ambulation.  They are instructed not to drive or perform any heavy lifting until returning to see the physician in his office.  Discharge Instructions    ABDOMINAL PROCEDURE/ANEURYSM REPAIR/AORTO-BIFEMORAL BYPASS:  Call MD for increased abdominal pain; cramping diarrhea; nausea/vomiting    Complete by:  As directed    Call MD for:  redness, tenderness, or signs of infection (pain, swelling, bleeding, redness, odor or green/yellow discharge around incision site)    Complete by:  As directed    Call MD for:  severe or increased pain, loss or decreased feeling  in affected limb(s)  Complete by:  As directed    Call MD for:  temperature >100.5    Complete by:  As directed    Discharge wound care:    Complete by:  As directed    Shower daily with soap and water starting 01/24/16   Driving Restrictions    Complete by:  As directed    No driving for 2 weeks   Lifting restrictions    Complete by:  As directed    No lifting for 4 weeks   Resume previous diet    Complete by:  As directed       Discharge Diagnosis:  Abdominal aortic aneurysm I71.4  Secondary Diagnosis: Patient Active Problem List    Diagnosis Date Noted  . AAA (abdominal aortic aneurysm) without rupture ([place]) 01/22/2016  . Chills 12/22/2015  . Unstable angina ([place]) 10/29/2015  . Lumbar radiculopathy 05/15/2015  . Acute sinus infection 05/14/2015  . Arthritis of left hip 04/16/2015  . Right otitis media 03/23/2015  . Right groin pain 03/23/2015  . Acute gout 01/16/2015  . Right knee pain 07/15/2014  . Acute upper respiratory infection 06/09/2014  . Angioedema of lips 06/03/2014  . Frequent urination 05/08/2014  . Acute epiglottitis without obstruction 05/05/2014  . Angioedema   . Neck swelling   . Abdominal tenderness 03/26/2014  . Noncompliance with medications 02/26/2014  . AAA (abdominal aortic aneurysm) ([place])   . Chest pain, stable coronary arteries at cath, non cardiac pain, resolved 02/24/2014  . Jaw pain 09/25/2013  . Myalgia 09/25/2013  . Gout 08/22/2013  . Abdominal pain, unspecified site 08/13/2013  . Musculoskeletal pain 08/04/2013  . Aftercare following surgery of the circulatory system, [place] 06/25/2013  . Peripheral vascular disease, unspecified 06/19/2012  . Dry cough 03/05/2012  . Dysuria 04/21/2011  . Low back pain 10/04/2010  . Acute ischemic colitis ([place]) 07/29/2010  . Lower GI bleed 07/23/2010  . Liver lesion 07/23/2010  . Preventative health care 07/22/2010  . URTICARIA 09/23/2009  . TOBACCO ABUSE 11/18/2008  . Obesity 11/17/2008  . MENOPAUSAL DISORDER 10/29/2007  . ANXIETY 06/22/2007  . DEPRESSION 06/22/2007  . OTITIS MEDIA, SEROUS, CHRONIC 04/23/2007  . CONSTIPATION 02/07/2007  . [place] DISEASE, LUMBOSACRAL SPINE 02/07/2007  . Diabetes ([place]) 11/03/2006  . Allergic rhinitis 11/03/2006  . HYPOTHYROIDISM 07/31/2006  . Hyperlipidemia 07/31/2006  . Essential hypertension 07/31/2006  . Coronary artery disease involving native heart without angina pectoris 07/31/2006   Past Medical History:  Diagnosis Date  . AAA (abdominal aortic aneurysm) ([place])    a. 4.7cm in 08/2012. Followed by  [facility].  . Allergic rhinitis, cause unspecified   . Anxiety state, unspecified   . Chronic LBP   . Coronary artery disease    a. s/p inferior MI 1998->PCI of RCA. b. S/p prior PCI of the OM1 and LAD;  c. 05/2013 Cath/PCI: LM nl, LAD 90p (3.0x22 Resolute DES), 81m w/ patent stent, Diag 40p, 46m, LCX patent OM1 stent, RCA 50p, 22m, EF 65%. d. 10/12 PCI with DESx3 to RCA  . Degeneration of lumbar or lumbosacral intervertebral disc   . Depressive disorder, not elsewhere classified   . Diabetes mellitus    TYPE II  . Gout 08/22/2013  . Hyperlipidemia   . Hypertension   . Hypothyroidism   . Lower GI bleed 06/2010   Diverticular bleed  . Noncompliance with medications 02/26/2014  . Obesity, unspecified      Allergies as of 01/24/2016      Reactions   Prilosec [omeprazole] Other (  See Comments)   Chest pain   Miconazole Nitrate Hives   REACTION: hives   Augmentin [amoxicillin-pot Clavulanate] Hives, Itching, Rash   Has patient had a PCN reaction causing immediate rash, facial/tongue/throat swelling, SOB or lightheadedness with hypotension:unsure Has patient had a PCN reaction causing severe rash involving mucus membranes or skin necrosis:unsure Has patient had a PCN reaction that required hospitalization:No Has patient had a PCN reaction occurring within the last 10 years:NO If all of the above answers are "NO", then may proceed with Cephalosporin use.   Doxycycline Other (See Comments)   REACTION: gi upset      Medication List    TAKE these medications   allopurinol 300 MG tablet Commonly known as:  ZYLOPRIM Take 1 tablet (300 mg total) by mouth daily as needed (for gout flare ups.).   aspirin 81 MG EC tablet Take 81 mg by mouth daily.   atenolol 25 MG tablet Commonly known as:  TENORMIN Take 1 tablet (25 mg total) by mouth 2 (two) times daily. Notes to patient:  Take next dose before bed   clopidogrel 75 MG tablet Commonly known as:  PLAVIX Take 1 tablet (75 mg total) by mouth  every evening. Notes to patient:  Take dose this evening   colchicine 0.6 MG tablet Take 0.6 mg by mouth 3 (three) times daily as needed (for gout flare up.).   [employer] LUBRICATING/DRY EYE OP Place 1-2 drops into both eyes 3 (three) times daily as needed (for dry eyes.).   famotidine 20 MG tablet Commonly known as:  PEPCID Take 1 tablet (20 mg total) by mouth daily as needed (for acid reflux.).   gabapentin 300 MG capsule Commonly known as:  NEURONTIN Take 1-2 capsules (300-600 mg total) by mouth daily as needed (for pain/neuropathy). 1-2 tab by mouth at bedtime What changed:  how much to take  how to take this  when to take this  reasons to take this   metFORMIN 500 MG 24 hr tablet Commonly known as:  GLUCOPHAGE-XR TAKE 4 TABLETS BY MOUTH EVERY MORNING   nitroGLYCERIN 0.4 MG SL tablet Commonly known as:  NITROSTAT Place 1 tablet (0.4 mg total) under the tongue every 5 (five) minutes as needed for chest pain.   rosuvastatin 40 MG tablet Commonly known as:  CRESTOR Take 1 tablet (40 mg total) by mouth daily.   sulfamethoxazole-trimethoprim 800-160 MG tablet Commonly known as:  BACTRIM DS Take 1 tablet by mouth 2 (two) times daily. Notes to patient:  Take dose before bed   SYNTHROID 150 MCG tablet Generic drug:  levothyroxine TAKE 1 TABLET BY MOUTH EVERY DAY   traMADol 50 MG tablet Commonly known as:  ULTRAM Take 1 tablet (50 mg total) by mouth every 6 (six) hours as needed for moderate pain.   Vitamin D (Ergocalciferol) 50000 units Caps capsule Commonly known as:  DRISDOL Take 1 capsule (50,000 Units total) by mouth every 7 (seven) days. What changed:  when to take this       Tramadol #10 No Refill  Disposition: Home  Patient's condition: is Good  Follow up: 1. Dr. Donnetta Hutching in 4 weeks with CTA   Virgina Jock, PA-C [facility] [phone] 02/02/2016  10:29 AM   - For VQI Registry use --- Instructions: Press F2 to tab through  selections.  Delete question if not applicable.   Post-op:  Time to Extubation: [x ] In OR, [ ]  < 12 hrs, [ ]  12-24 hrs, [ ]  >=24  hrs Vasopressors Req. Post-op: No MI: No., [ ]  Troponin only, [ ]  EKG or Clinical New Arrhythmia: No CHF: No ICU Stay: 0 days Transfusion: No    Complications: Resp failure: No., [ ]  Pneumonia, [ ]  Ventilator Chg in renal function: No., [ ]  Inc. Cr > 0.5, [ ]  Temp. Dialysis, [ ]  Permanent dialysis Leg ischemia: No., no Surgery needed, [ ]  Yes, Surgery needed, [ ]  Amputation Bowel ischemia: No., [ ]  Medical Rx, [ ]  Surgical Rx Wound complication: No., [ ]  Superficial separation/infection, [ ]  Return to OR Return to OR: No  Return to OR for bleeding: No Stroke: No., [ ]  Minor, [ ]  Major  Discharge medications: Statin use:  Yes If No: [ ]  For Medical reasons, [ ]  Non-compliant, [ ]  Not-indicated ASA use:  Yes  If No: [ ]  For Medical reasons, [ ]  Non-compliant, [ ]  Not-indicated Plavix use:  Yes If No: [ ]  For Medical reasons, [ ]  Non-compliant, [ ]  Not-indicated Beta blocker use:  Yes If No: [ ]  For Medical reasons, [ ]  Non-compliant, [ ]  Not-indicated

## 2016-02-08 ENCOUNTER — Encounter: Payer: Self-pay | Admitting: Cardiovascular Disease

## 2016-02-08 ENCOUNTER — Ambulatory Visit (INDEPENDENT_AMBULATORY_CARE_PROVIDER_SITE_OTHER): Payer: Medicare Other | Admitting: Cardiovascular Disease

## 2016-02-08 DIAGNOSIS — I2510 Atherosclerotic heart disease of native coronary artery without angina pectoris: Secondary | ICD-10-CM

## 2016-02-08 DIAGNOSIS — E7800 Pure hypercholesterolemia, unspecified: Secondary | ICD-10-CM

## 2016-02-08 MED ORDER — ROSUVASTATIN CALCIUM 40 MG PO TABS: 40.0000 mg | ORAL_TABLET | Freq: Every day | ORAL | 3 refills | Status: DC

## 2016-02-08 NOTE — Patient Instructions (Signed)
Medication Instructions:  Your physician recommends that you continue on your current medications as directed. Please refer to the Current Medication list given to you today.  Labwork: No new orders.   Testing/Procedures: No new orders.   Follow-Up: Your physician recommends that you schedule a follow-up appointment in: 3 MONTHS with Richardson Dopp PA-C  Your physician wants you to follow-up in: 6 MONTHS with Dr Burt Knack.  You will receive a reminder letter in the mail two months in advance. If you don't receive a letter, please call our office to schedule the follow-up appointment.   Any Other Special Instructions Will Be Listed Below (If Applicable).     If you need a refill on your cardiac medications before your next appointment, please call your pharmacy.

## 2016-02-08 NOTE — Progress Notes (Signed)
Cardiology Office Note Date:  02/08/2016   ID:  Brandi, [name] November 26, 1945, MRN [id number]  PCP:  [name], MD  Cardiologist:  Sherren Mocha, MD    Chief Complaint  Patient presents with  . Coronary Artery Disease    2-3 month follow up     History of Present Illness: [name] is a 71 y.o. female who presents for Follow-up of coronary artery disease. She has been followed for CAD and has undergone stenting of the LAD and left circumflex. Other cardiovascular problems include hypertension, diabetes, hyperlipidemia, and abdominal aortic aneurysm. She was admitted with unstable angina in October 2017 and underwent FFR guided PCI of the right coronary artery treated with 3 overlapping drug-eluting stents.  The patient underwent endovascular repair of an abdominal aortic aneurysm 01/22/2016. Her postoperative course was uncomplicated. She was discharged home on postoperative day #2.  The patient is here alone today. She is doing pretty well. She has not had any recent chest pain or pressure. Denies shortness of breath. Has some bilateral groin soreness related to her. Denies any bruising or bleeding problems. No orthopnea, PND, or heart palpitations.   Past Medical History:  Diagnosis Date  . AAA (abdominal aortic aneurysm) ([place])    a. 4.7cm in 08/2012. Followed by [facility].  . Allergic rhinitis, cause unspecified   . Anxiety state, unspecified   . Chronic LBP   . Coronary artery disease    a. s/p inferior MI 1998->PCI of RCA. b. S/p prior PCI of the OM1 and LAD;  c. 05/2013 Cath/PCI: LM nl, LAD 90p (3.0x22 Resolute DES), 26mw/ patent stent, Diag 40p, 729mLCX patent OM1 stent, RCA 50p, 4062mF 65%. d. 10/12 PCI with DESx3 to RCA  . Degeneration of lumbar or lumbosacral intervertebral disc   . Depressive disorder, not elsewhere classified   . Diabetes mellitus    TYPE II  . Gout 08/22/2013  . Hyperlipidemia   . Hypertension   . Hypothyroidism   . Lower GI bleed 06/2010   Diverticular bleed  . Noncompliance with medications 02/26/2014  . Obesity, unspecified     Past Surgical History:  Procedure Laterality Date  . CARDIAC CATHETERIZATION     PCI OF BOTH THE CIRCUMFLEX AND LEFT ANTERIOR DESCENDING ARTERY  . CARDIAC CATHETERIZATION N/A 10/29/2015   Procedure: Coronary Stent Intervention;  Surgeon: ChrNelva BushD;  Location: [facility];  Service: Cardiovascular;  Laterality: N/A;  . CARDIAC CATHETERIZATION N/A 10/29/2015   Procedure: Coronary/Graft Angiography;  Surgeon: ChrNelva BushD;  Location: [facility];  Service: Cardiovascular;  Laterality: N/A;  . CARDIAC CATHETERIZATION N/A 10/29/2015   Procedure: Intravascular Pressure Wire/FFR Study;  Surgeon: ChrNelva BushD;  Location: [facility];  Service: Cardiovascular;  Laterality: N/A;  . CESAREAN SECTION    . ENDOVASCULAR STENT INSERTION N/A 01/22/2016   Procedure: ABDOMINAL AORTIC ENDOVASCULAR STENT GRAFT INSERTION;  Surgeon: TodRosetta PosnerD;  [facility];  Laterality: N/A;  . HEART STENT  04-2010  and  Jun 07, 2013   X 3  . LEFT HEART CATHETERIZATION WITH CORONARY ANGIOGRAM N/A 06/07/2013   Procedure: LEFT HEART CATHETERIZATION WITH CORONARY ANGIOGRAM;  Surgeon: ChrBurnell BlanksD;  [facility];  Service: Cardiovascular;  Laterality: N/A;  . LEFT HEART CATHETERIZATION WITH CORONARY ANGIOGRAM N/A 02/25/2014   Procedure: LEFT HEART CATHETERIZATION WITH CORONARY ANGIOGRAM;  Surgeon: ThoTroy SineD;  [facility];  Service:  Cardiovascular;  Laterality: N/A;  . LUMBAR FUSION  01/2007   DR. Patrice Paradise...3-LEVEL WITH FIXATION  . OOPHORECTOMY     BSO? pt.unsure  . PARATHYROIDECTOMY    . SPINE SURGERY    . THYROIDECTOMY    . TOTAL ABDOMINAL HYSTERECTOMY      Current Outpatient Prescriptions  Medication Sig Dispense Refill  . allopurinol (ZYLOPRIM) 300 MG tablet Take 1 tablet (300 mg total) by mouth daily as needed (for gout  flare ups.).    [name] aspirin 81 MG EC tablet Take 81 mg by mouth daily.     . Carboxymethylcellul-Glycerin ([employer] LUBRICATING/DRY EYE OP) Place 1-2 drops into both eyes 3 (three) times daily as needed (for dry eyes.).    [name] clopidogrel (PLAVIX) 75 MG tablet Take 1 tablet (75 mg total) by mouth every evening.    . colchicine 0.6 MG tablet Take 0.6 mg by mouth 3 (three) times daily as needed (for gout flare up.).     [name] famotidine (PEPCID) 20 MG tablet Take 1 tablet (20 mg total) by mouth daily as needed (for acid reflux.).    [name] gabapentin (NEURONTIN) 300 MG capsule Take 1-2 capsules (300-600 mg total) by mouth daily as needed (for pain/neuropathy). 1-2 tab by mouth at bedtime    . metFORMIN (GLUCOPHAGE-XR) 500 MG 24 hr tablet TAKE 4 TABLETS BY MOUTH EVERY MORNING 360 tablet 0  . nitroGLYCERIN (NITROSTAT) 0.4 MG SL tablet Place 1 tablet (0.4 mg total) under the tongue every 5 (five) minutes as needed for chest pain. 25 tablet 3  . rosuvastatin (CRESTOR) 40 MG tablet Take 1 tablet (40 mg total) by mouth daily. 90 tablet 3  . SYNTHROID 150 MCG tablet TAKE 1 TABLET BY MOUTH EVERY DAY 90 tablet 1  . traMADol (ULTRAM) 50 MG tablet Take 1 tablet (50 mg total) by mouth every 6 (six) hours as needed for moderate pain. 10 tablet 0  . Vitamin D, Ergocalciferol, (DRISDOL) 50000 units CAPS capsule Take 1 capsule (50,000 Units total) by mouth every 7 (seven) days. (Patient taking differently: Take 50,000 Units by mouth every Wednesday. ) 12 capsule 0  . atenolol (TENORMIN) 25 MG tablet Take 1 tablet (25 mg total) by mouth 2 (two) times daily. 180 tablet 3   No current facility-administered medications for this visit.     Allergies:   Prilosec [omeprazole]; Miconazole nitrate; Augmentin [amoxicillin-pot clavulanate]; and Doxycycline   Social History:  The patient  reports that she quit smoking about 29 years ago. Her smoking use included Cigarettes. She quit after 30.00 years of use. She has never used smokeless  tobacco. She reports that she does not drink alcohol or use drugs.   Family History:  The patient's  family history includes Arthritis in her maternal aunt; Breast cancer in her maternal aunt; Diabetes in her brother and maternal aunt; Heart attack (age of onset: 4) in her mother; Heart attack (age of onset: 99) in her father; Heart disease in her father and mother.    ROS:  Please see the history of present illness.  All other systems are reviewed and negative.    PHYSICAL EXAM: VS:  BP 108/60   Pulse 78   Ht _0  (1.676 m)   Wt 221 lb 3.2 oz (100.3 kg)   BMI 35.70 kg/m  , BMI Body mass index is 35.7 kg/m. GEN: Well nourished, well developed, in no acute distress  HEENT: normal  Neck: no JVD, no masses. No carotid bruits Cardiac: RRR without murmur  or gallop                Respiratory:  clear to auscultation bilaterally, normal work of breathing GI: soft, nontender, nondistended, + BS MS: no deformity or atrophy  Ext: no pretibial edema, pedal pulses 2+= bilaterally, bilateral groin sites are clear Skin: warm and dry, no rash Neuro:  Strength and sensation are intact Psych: euthymic mood, full affect  EKG:  EKG is not ordered today.  Recent Labs: 03/27/2015: TSH 6.39 01/15/2016: ALT 24 01/22/2016: Magnesium 1.6 01/23/2016: BUN 7; Creatinine, Ser 0.85; Hemoglobin 11.5; Platelets 225; Potassium 3.9; Sodium 140   Lipid Panel     Component Value Date/Time   CHOL 157 01/05/2016 1214   TRIG 127 01/05/2016 1214   TRIG 102 01/02/2006 0825   HDL 28 (L) 01/05/2016 1214   CHOLHDL 5.6 (H) 01/05/2016 1214   VLDL 25 01/05/2016 1214   LDLCALC 104 (H) 01/05/2016 1214   LDLDIRECT 131.0 03/27/2015 1137      Wt Readings from Last 3 Encounters:  02/08/16 221 lb 3.2 oz (100.3 kg)  01/23/16 230 lb 8 oz (104.6 kg)  01/15/16 221 lb 14.4 oz (100.7 kg)     Cardiac Studies Reviewed: Abd [place] 12/08/15 4.8 x 4.9 cm AAA  LHC 10/29/15 LM ok LAD prox stent patent with 30 ISR, dist 70, D2  70 LCx mid 60, OM2 stent patent RCA ost 60, ost-prox 50, prox-mid 70, mid 50, RPDA 40 FFR of RCA 0.71 (hemodynamically significant) PCI: 3 x 28 mm Promus, 3 x 16 mm Promus, 3 x 12 mm Promus DES to RCA  Conclusions: 1. Multivessel coronary artery disease, including 60% ostial and 70% proximal/mid RCA disease that is hemodynamically significant by FFR (0.71). Mild in-stent restenosis of LAD stents, as well as 60% stenosis in the mid LCx adjacent to previously stented large OM are also present. 2. Successful FFR-guided PCI of the RCA from the ostium to the midvessel with three overlapping Promus Premier stents post-dilated to 3.25 mm with 0% residual stenosis and TIMI-3 flow. 3. Patient had significant pain chest pain beginning with FFR and continuing throughout intervention despite aggressive sedation and analgesia. No clear angiographic or electrocardiographic etiology was identified. Pain had returned to pre-cath level by the end of the procedure.  Echo 10/29/15 EF 55-60, no RWMA, Gr 1 DD, MAC  Echo 11/16 EF 55-60, Gr 1 DD  LHC 2/16 LM ok LAD prox stent ok LCx stent ok RCA prox 40 EF 55-60   Abdominal US 12/15 4 x 4 cm AAA  ASSESSMENT AND PLAN: 1.  CAD, native vessel: No symptoms of angina at present. The importance of continued adherence to dual antiplatelet therapy is reviewed with the patient. She will continue on aspirin and clopidogrel. Most recent cath and echo studies are reviewed. As long as she is tolerating dual antiplatelet therapy, I might consider long-term treatment in the context of her extensive coronary vascular disease.  2. Hyperlipidemia: She will continue on Crestor 40 mg daily. Most recent lipids reviewed.  3. Infrarenal abdominal aortic aneurysm: Status post recent endovascular repair. Close follow-up with vascular surgery. Seems to be doing quite well.  4. Tobacco abuse: States she only smokes when she is angry. Denies smoking much at all. Cessation  counseling is done.  Current medicines are reviewed with the patient today.  The patient does not have concerns regarding medicines.  Labs/ tests ordered today include:  No orders of the defined types were placed in this encounter.  Disposition:   FU 6 months Richardson Dopp, PA-C.   Deatra James, MD  02/08/2016 11:14 AM    [facility] [place], [place],   [address] Phone: [phone]; Fax: [phone]

## 2016-02-22 ENCOUNTER — Ambulatory Visit
Admission: RE | Admit: 2016-02-22 | Discharge: 2016-02-22 | Disposition: A | Discharge status: Home / Self Care | Payer: Medicare Other | Source: Ambulatory Visit | Attending: Vascular Surgery | Admitting: Vascular Surgery

## 2016-02-22 DIAGNOSIS — I714 Abdominal aortic aneurysm, without rupture, unspecified: Secondary | ICD-10-CM

## 2016-02-22 DIAGNOSIS — Z48812 Encounter for surgical aftercare following surgery on the circulatory system: Secondary | ICD-10-CM

## 2016-02-22 MED ORDER — IOPAMIDOL (ISOVUE-370) INJECTION 76%
75.0000 mL | Freq: Once | INTRAVENOUS | Status: AC | PRN
  Administered 2016-02-22: 75 mL via INTRAVENOUS

## 2016-02-23 ENCOUNTER — Encounter: Payer: Self-pay | Admitting: Vascular Surgery

## 2016-03-01 ENCOUNTER — Encounter: Payer: Self-pay | Admitting: Vascular Surgery

## 2016-03-01 ENCOUNTER — Ambulatory Visit (INDEPENDENT_AMBULATORY_CARE_PROVIDER_SITE_OTHER): Payer: Self-pay | Admitting: Vascular Surgery

## 2016-03-01 VITALS — BP 109/72 | HR 70 | Temp 98.1°F | Resp 18 | Ht 65.0 in | Wt 215.0 lb

## 2016-03-01 DIAGNOSIS — I714 Abdominal aortic aneurysm, without rupture, unspecified: Secondary | ICD-10-CM

## 2016-03-01 NOTE — Progress Notes (Signed)
   Patient name: [name] MRN: [id number] DOB: [birth year] Sex: female  REASON FOR VISIT: Follow-up stent graft repair of abdominal aortic aneurysm  HPI: [name] is a 71 y.o. female here for follow-up. Underwent uneventful stent graft repair of abdominal aortic aneurysm on 01/22/2016. She reports that she has not fully regained her stamina that she had prior to hospitalization. She denies any groin discomfort. She does report soreness in her surface of her abdomen and also chronic back pain. Has had surgery in the past in her back and does not want to pursue any other treatment  Current Outpatient Prescriptions  Medication Sig Dispense Refill  . allopurinol (ZYLOPRIM) 300 MG tablet Take 1 tablet (300 mg total) by mouth daily as needed (for gout flare ups.).    [name] aspirin 81 MG EC tablet Take 81 mg by mouth daily.     . Carboxymethylcellul-Glycerin ([employer] LUBRICATING/DRY EYE OP) Place 1-2 drops into both eyes 3 (three) times daily as needed (for dry eyes.).    [name] clopidogrel (PLAVIX) 75 MG tablet Take 1 tablet (75 mg total) by mouth every evening.    . colchicine 0.6 MG tablet Take 0.6 mg by mouth 3 (three) times daily as needed (for gout flare up.).     [name] famotidine (PEPCID) 20 MG tablet Take 1 tablet (20 mg total) by mouth daily as needed (for acid reflux.).    [name] gabapentin (NEURONTIN) 300 MG capsule Take 1-2 capsules (300-600 mg total) by mouth daily as needed (for pain/neuropathy). 1-2 tab by mouth at bedtime    . metFORMIN (GLUCOPHAGE-XR) 500 MG 24 hr tablet TAKE 4 TABLETS BY MOUTH EVERY MORNING 360 tablet 0  . nitroGLYCERIN (NITROSTAT) 0.4 MG SL tablet Place 1 tablet (0.4 mg total) under the tongue every 5 (five) minutes as needed for chest pain. 25 tablet 3  . rosuvastatin (CRESTOR) 40 MG tablet Take 1 tablet (40 mg total) by mouth daily. 90 tablet 3  . SYNTHROID 150 MCG tablet TAKE 1 TABLET BY MOUTH EVERY DAY 90 tablet 1  . Vitamin D, Ergocalciferol,  (DRISDOL) 50000 units CAPS capsule Take 1 capsule (50,000 Units total) by mouth every 7 (seven) days. 12 capsule 0  . atenolol (TENORMIN) 25 MG tablet Take 1 tablet (25 mg total) by mouth 2 (two) times daily. 180 tablet 3  . traMADol (ULTRAM) 50 MG tablet Take 1 tablet (50 mg total) by mouth every 6 (six) hours as needed for moderate pain. (Patient not taking: Reported on 03/01/2016) 10 tablet 0   No current facility-administered medications for this visit.      PHYSICAL EXAM: Vitals:   03/01/16 1245  BP: 109/72  Pulse: 70  Resp: 18  Temp: 98.1 F (36.7 C)  TempSrc: Oral  SpO2: 100%  Weight: 215 lb (97.5 kg)  Height: 5\' 5"  (1.651 m)    GENERAL: The patient is a well-nourished female, in no acute distress. The vital signs are documented above. Groins without hematoma. 2+ popliteal pulses bilaterally  CT scan was reviewed and discussed with the patient. Shows a good positioning of her aortobiiliac stent graft within no enlargement in size with maximum diameter 5.4 cm  MEDICAL ISSUES: Stable 6 weeks status post stent graft repair of abdominal aortic aneurysm. Will resume full activities. We'll see [place] again in 6 months with ultrasound follow-up.   Rosetta Posner, MD FACS [facility] Tel [phone] Pager [phone]

## 2016-03-08 ENCOUNTER — Encounter (HOSPITAL_COMMUNITY): Payer: Self-pay | Admitting: Internal Medicine

## 2016-03-08 NOTE — Progress Notes (Signed)
Mailed patient letter with information about Cardiac Rehab program. MW °

## 2016-03-10 NOTE — Addendum Note (Signed)
Addended by: Lianne Cure A on: 03/10/2016 11:26 AM   Modules accepted: Orders

## 2016-03-13 ENCOUNTER — Other Ambulatory Visit: Payer: Self-pay | Admitting: Internal Medicine

## 2016-03-23 ENCOUNTER — Other Ambulatory Visit (INDEPENDENT_AMBULATORY_CARE_PROVIDER_SITE_OTHER): Payer: Medicare Other

## 2016-03-23 ENCOUNTER — Encounter: Payer: Self-pay | Admitting: Internal Medicine

## 2016-03-23 ENCOUNTER — Ambulatory Visit (INDEPENDENT_AMBULATORY_CARE_PROVIDER_SITE_OTHER): Payer: Medicare Other | Admitting: Internal Medicine

## 2016-03-23 VITALS — BP 126/84 | HR 97 | Temp 98.6°F | Ht 65.0 in

## 2016-03-23 DIAGNOSIS — I10 Essential (primary) hypertension: Secondary | ICD-10-CM | POA: Diagnosis not present

## 2016-03-23 DIAGNOSIS — E11311 Type 2 diabetes mellitus with unspecified diabetic retinopathy with macular edema: Secondary | ICD-10-CM | POA: Diagnosis not present

## 2016-03-23 DIAGNOSIS — M109 Gout, unspecified: Secondary | ICD-10-CM

## 2016-03-23 DIAGNOSIS — Z0001 Encounter for general adult medical examination with abnormal findings: Secondary | ICD-10-CM

## 2016-03-23 LAB — CBC WITH DIFFERENTIAL/PLATELET
BASOS PCT: 0.5 % (ref 0.0–3.0)
Basophils Absolute: 0.1 10*3/uL (ref 0.0–0.1)
EOS PCT: 0.5 % (ref 0.0–5.0)
Eosinophils Absolute: 0 10*3/uL (ref 0.0–0.7)
HEMATOCRIT: 39.3 % (ref 36.0–46.0)
HEMOGLOBIN: 12.8 g/dL (ref 12.0–15.0)
Lymphocytes Relative: 18.5 % (ref 12.0–46.0)
Lymphs Abs: 1.7 10*3/uL (ref 0.7–4.0)
MCHC: 32.6 g/dL (ref 30.0–36.0)
MCV: 84.2 fl (ref 78.0–100.0)
MONO ABS: 0.7 10*3/uL (ref 0.1–1.0)
MONOS PCT: 7.6 % (ref 3.0–12.0)
Neutro Abs: 6.9 10*3/uL (ref 1.4–7.7)
Neutrophils Relative %: 72.9 % (ref 43.0–77.0)
Platelets: 328 10*3/uL (ref 150.0–400.0)
RBC: 4.67 Mil/uL (ref 3.87–5.11)
RDW: 15.4 % (ref 11.5–15.5)
WBC: 9.4 10*3/uL (ref 4.0–10.5)

## 2016-03-23 LAB — HEPATIC FUNCTION PANEL
ALT: 17 U/L (ref 0–35)
AST: 14 U/L (ref 0–37)
Albumin: 3.7 g/dL (ref 3.5–5.2)
Alkaline Phosphatase: 87 U/L (ref 39–117)
BILIRUBIN TOTAL: 1.1 mg/dL (ref 0.2–1.2)
Bilirubin, Direct: 0.3 mg/dL (ref 0.0–0.3)
Total Protein: 8.1 g/dL (ref 6.0–8.3)

## 2016-03-23 LAB — LIPID PANEL
CHOL/HDL RATIO: 5
Cholesterol: 161 mg/dL (ref 0–200)
HDL: 34.3 mg/dL — ABNORMAL LOW (ref >39.00)
LDL CALC: 108 mg/dL — AB (ref 0–99)
NonHDL: 126.57
Triglycerides: 91 mg/dL (ref 0.0–149.0)
VLDL: 18.2 mg/dL (ref 0.0–40.0)

## 2016-03-23 LAB — BASIC METABOLIC PANEL
BUN: 7 mg/dL (ref 6–23)
CALCIUM: 9.4 mg/dL (ref 8.4–10.5)
CO2: 29 mEq/L (ref 19–32)
Chloride: 101 mEq/L (ref 96–112)
Creatinine, Ser: 0.89 mg/dL (ref 0.40–1.20)
GFR: 80.5 mL/min (ref >60.00)
Glucose, Bld: 130 mg/dL — ABNORMAL HIGH (ref 70–99)
POTASSIUM: 3.7 meq/L (ref 3.5–5.1)
SODIUM: 137 meq/L (ref 135–145)

## 2016-03-23 LAB — HEMOGLOBIN A1C: Hgb A1c MFr Bld: 6.4 % (ref 4.6–6.5)

## 2016-03-23 LAB — MICROALBUMIN / CREATININE URINE RATIO
Creatinine,U: 363.3 mg/dL
MICROALB UR: 3.5 mg/dL — AB (ref 0.0–1.9)
Microalb Creat Ratio: 1 mg/g (ref 0.0–30.0)

## 2016-03-23 LAB — URIC ACID: Uric Acid, Serum: 6.3 mg/dL (ref 2.4–7.0)

## 2016-03-23 LAB — TSH: TSH: 0.89 u[IU]/mL (ref 0.35–4.50)

## 2016-03-23 MED ORDER — OXYCODONE HCL 5 MG PO TABS: 5.0000 mg | ORAL_TABLET | Freq: Four times a day (QID) | ORAL | 0 refills | Status: DC | PRN

## 2016-03-23 MED ORDER — METHYLPREDNISOLONE ACETATE 80 MG/ML IJ SUSP
80.0000 mg | Freq: Once | INTRAMUSCULAR | Status: AC
  Administered 2016-03-23: 80 mg via INTRAMUSCULAR

## 2016-03-23 MED ORDER — PREDNISONE 10 MG PO TABS: ORAL_TABLET | ORAL | 0 refills | Status: DC

## 2016-03-23 MED ORDER — KETOROLAC TROMETHAMINE 30 MG/ML IJ SOLN
30.0000 mg | Freq: Once | INTRAMUSCULAR | Status: AC
  Administered 2016-03-23: 30 mg via INTRAMUSCULAR

## 2016-03-23 MED ORDER — ALLOPURINOL 300 MG PO TABS: 300.0000 mg | ORAL_TABLET | Freq: Every day | ORAL | 3 refills | Status: DC

## 2016-03-23 NOTE — Progress Notes (Signed)
Subjective:    Patient ID: [name], female    DOB: [birth year], 71 y.o.   MRN: [id number]  HPI  Here for wellness and f/u;  Overall doing ok;  Pt denies Chest pain, worsening SOB, DOE, wheezing, orthopnea, PND, worsening LE edema, palpitations, dizziness or syncope.  Pt denies neurological change such as new headache, facial or extremity weakness.  Pt states overall good compliance with treatment and medications, good tolerability, and has been trying to follow appropriate diet.  Pt denies worsening depressive symptoms, suicidal ideation or panic. No fever, night sweats, wt loss, loss of appetite, or other constitutional symptoms.  Pt states good ability with ADL's, has low fall risk, home safety reviewed and adequate, no other significant changes in hearing or vision, and only occasionally active with exercise.  Declines DXA for now. .    C/o Bilat severe ankle and feet pain and swelling.  Did not start allopurinol last visit, took a dose last PM and seemed to make worse.  No fever, trauma, or other skin change or drainage.   Pt denies polydipsia, polyuria, or low sugar symptoms such as weakness or confusion improved with po intake.  Pt states overall good compliance with meds, trying to follow lower cholesterol, diabetic diet, wt overall stable but little exercise however.    Past Medical History:  Diagnosis Date  . AAA (abdominal aortic aneurysm) ([place])    a. 4.7cm in 08/2012. Followed by [facility].  . Allergic rhinitis, cause unspecified   . Anxiety state, unspecified   . Chronic LBP   . Coronary artery disease    a. s/p inferior MI 1998->PCI of RCA. b. S/p prior PCI of the OM1 and LAD;  c. 05/2013 Cath/PCI: LM nl, LAD 90p (3.0x22 Resolute DES), 43m w/ patent stent, Diag 40p, 39m, LCX patent OM1 stent, RCA 50p, 58m, EF 65%. d. 10/12 PCI with DESx3 to RCA  . Degeneration of lumbar or lumbosacral intervertebral disc   . Depressive disorder, not elsewhere classified   . Diabetes mellitus    TYPE II   . Gout 08/22/2013  . Hyperlipidemia   . Hypertension   . Hypothyroidism   . Lower GI bleed 06/2010   Diverticular bleed  . Noncompliance with medications 02/26/2014  . Obesity, unspecified    Past Surgical History:  Procedure Laterality Date  . CARDIAC CATHETERIZATION     PCI OF BOTH THE CIRCUMFLEX AND LEFT ANTERIOR DESCENDING ARTERY  . CARDIAC CATHETERIZATION N/A 10/29/2015   Procedure: Coronary Stent Intervention;  Surgeon: Nelva Bush, MD;  Location: [place] [facility];  Service: Cardiovascular;  Laterality: N/A;  . CARDIAC CATHETERIZATION N/A 10/29/2015   Procedure: Coronary/Graft Angiography;  Surgeon: Nelva Bush, MD;  Location: [place] [facility];  Service: Cardiovascular;  Laterality: N/A;  . CARDIAC CATHETERIZATION N/A 10/29/2015   Procedure: Intravascular Pressure Wire/FFR Study;  Surgeon: Nelva Bush, MD;  Location: [place] [facility];  Service: Cardiovascular;  Laterality: N/A;  . CESAREAN SECTION    . ENDOVASCULAR STENT INSERTION N/A 01/22/2016   Procedure: ABDOMINAL AORTIC ENDOVASCULAR STENT GRAFT INSERTION;  Surgeon: Rosetta Posner, MD;  Location: [place];  Service: Vascular;  Laterality: N/A;  . HEART STENT  04-2010  and  Jun 07, 2013   X 3  . LEFT HEART CATHETERIZATION WITH CORONARY ANGIOGRAM N/A 06/07/2013   Procedure: LEFT HEART CATHETERIZATION WITH CORONARY ANGIOGRAM;  Surgeon: [name], MD;  Location: [facility] CATH LAB;  Service: Cardiovascular;  Laterality: N/A;  . LEFT HEART CATHETERIZATION  WITH CORONARY ANGIOGRAM N/A 02/25/2014   Procedure: LEFT HEART CATHETERIZATION WITH CORONARY ANGIOGRAM;  Surgeon: Troy Sine, MD;  Location: [facility] CATH LAB;  Service: Cardiovascular;  Laterality: N/A;  . LUMBAR FUSION  01/2007   DR. Patrice Paradise...3-LEVEL WITH FIXATION  . OOPHORECTOMY     BSO? pt.unsure  . PARATHYROIDECTOMY    . SPINE SURGERY    . THYROIDECTOMY    . TOTAL ABDOMINAL HYSTERECTOMY      reports that she quit smoking about 29 years ago. Her smoking use  included Cigarettes. She quit after 30.00 years of use. She has never used smokeless tobacco. She reports that she does not drink alcohol or use drugs. family history includes Arthritis in her maternal aunt; Breast cancer in her maternal aunt; Diabetes in her brother and maternal aunt; Heart attack (age of onset: 14) in her mother; Heart attack (age of onset: 46) in her father; Heart disease in her father and mother. Allergies  Allergen Reactions  . Prilosec [Omeprazole] Other (See Comments)    Chest pain  . Miconazole Nitrate Hives    REACTION: hives  . Augmentin [Amoxicillin-Pot Clavulanate] Hives, Itching and Rash    Has patient had a PCN reaction causing immediate rash, facial/tongue/throat swelling, SOB or lightheadedness with hypotension:unsure Has patient had a PCN reaction causing severe rash involving mucus membranes or skin necrosis:unsure Has patient had a PCN reaction that required hospitalization:No Has patient had a PCN reaction occurring within the last 10 years:NO If all of the above answers are "NO", then may proceed with Cephalosporin use.   [name] Doxycycline Other (See Comments)    REACTION: gi upset   Current Outpatient Prescriptions on File Prior to Visit  Medication Sig Dispense Refill  . aspirin 81 MG EC tablet Take 81 mg by mouth daily.     [name] atenolol (TENORMIN) 25 MG tablet TAKE 1 TABLET(25 MG) BY MOUTH TWICE DAILY 180 tablet 1  . Carboxymethylcellul-Glycerin ([employer] LUBRICATING/DRY EYE OP) Place 1-2 drops into both eyes 3 (three) times daily as needed (for dry eyes.).    [name] clopidogrel (PLAVIX) 75 MG tablet Take 1 tablet (75 mg total) by mouth every evening.    . colchicine 0.6 MG tablet Take 0.6 mg by mouth 3 (three) times daily as needed (for gout flare up.).     [name] famotidine (PEPCID) 20 MG tablet Take 1 tablet (20 mg total) by mouth daily as needed (for acid reflux.).    [name] gabapentin (NEURONTIN) 300 MG capsule Take 1-2 capsules (300-600 mg total) by mouth daily as  needed (for pain/neuropathy). 1-2 tab by mouth at bedtime    . metFORMIN (GLUCOPHAGE-XR) 500 MG 24 hr tablet TAKE 4 TABLETS BY MOUTH EVERY MORNING 360 tablet 0  . nitroGLYCERIN (NITROSTAT) 0.4 MG SL tablet Place 1 tablet (0.4 mg total) under the tongue every 5 (five) minutes as needed for chest pain. 25 tablet 3  . rosuvastatin (CRESTOR) 40 MG tablet Take 1 tablet (40 mg total) by mouth daily. 90 tablet 3  . SYNTHROID 150 MCG tablet TAKE 1 TABLET BY MOUTH EVERY DAY 90 tablet 1  . traMADol (ULTRAM) 50 MG tablet Take 1 tablet (50 mg total) by mouth every 6 (six) hours as needed for moderate pain. 10 tablet 0  . Vitamin D, Ergocalciferol, (DRISDOL) 50000 units CAPS capsule Take 1 capsule (50,000 Units total) by mouth every 7 (seven) days. 12 capsule 0  . atenolol (TENORMIN) 25 MG tablet Take 1 tablet (25 mg total) by mouth 2 (two)  times daily. 180 tablet 3   No current facility-administered medications on file prior to visit.    Review of Systems Constitutional: Negative for increased diaphoresis, or other activity, appetite or siginficant weight change other than noted HENT: Negative for worsening hearing loss, ear pain, facial swelling, mouth sores and neck stiffness.   Eyes: Negative for other worsening pain, redness or visual disturbance.  Respiratory: Negative for choking or stridor Cardiovascular: Negative for other chest pain and palpitations.  Gastrointestinal: Negative for worsening diarrhea, blood in stool, or abdominal distention Genitourinary: Negative for hematuria, flank pain or change in urine volume.  Musculoskeletal: Negative for myalgias or other joint complaints.  Skin: Negative for other color change and wound or drainage.  Neurological: Negative for syncope and numbness. other than noted Hematological: Negative for adenopathy. or other swelling Psychiatric/Behavioral: Negative for hallucinations, SI, self-injury, decreased concentration or other worsening agitation.  All  other system neg per pt    Objective:   Physical Exam BP 126/84   Pulse 97   Temp 98.6 F (37 C)   Ht 5\' 5"  (1.651 m)   SpO2 98%  VS noted,  Constitutional: Pt is oriented to person, place, and time. Appears well-developed and well-nourished, in no significant distress Head: Normocephalic and atraumatic  Eyes: Conjunctivae and EOM are normal. Pupils are equal, round, and reactive to light Right Ear: External ear normal.  Left Ear: External ear normal Nose: Nose normal.  Mouth/Throat: Oropharynx is clear and moist  Neck: Normal range of motion. Neck supple. No JVD present. No tracheal deviation present or significant neck LA or mass Cardiovascular: Normal rate, regular rhythm, normal heart sounds and intact distal pulses.   Pulmonary/Chest: Effort normal and breath sounds without rales or wheezing  Abdominal: Soft. Bowel sounds are normal. NT. No HSM  Musculoskeletal: Normal range of motion. Exhibits no edema except for bilat first MTP's with 2+ red/tender/swelling, limps and hobbles to walk Lymphadenopathy: Has no cervical adenopathy.  Neurological: Pt is alert and oriented to person, place, and time. Pt has normal reflexes. No cranial nerve deficit. Motor grossly intact Skin: Skin is warm and dry. No rash noted or new ulcers Psychiatric:  Has normal mood and affect. Behavior is normal.  No other exam findings    Assessment & Plan:

## 2016-03-23 NOTE — Assessment & Plan Note (Signed)
To start preventive allopurinol after acute episode resolved

## 2016-03-23 NOTE — Patient Instructions (Signed)
You had the steroid shot today, and the toradol shot for pain  Please take all new medication as prescribed - the allopurinol every day after the pain and swelling are gone to prevent more  Please take all new medication as prescribed - the oxycodone as needed, and the prednisone  Please continue all other medications as before  Please have the pharmacy call with any other refills you may need.  Please continue your efforts at being more active, low cholesterol diet, and weight control.  You are otherwise up to date with prevention measures today.  Please keep your appointments with your specialists as you may have planned  Please go to the LAB in the Basement (turn left off the elevator) for the tests to be done today  You will be contacted by phone if any changes need to be made immediately.  Otherwise, you will receive a letter about your results with an explanation, but please check with MyChart first.  Please remember to sign up for MyChart if you have not done so, as this will be important to you in the future with finding out test results, communicating by private email, and scheduling acute appointments online when needed.  Please return in 6 months, or sooner if needed, with Lab testing done 3-5 days before

## 2016-03-25 ENCOUNTER — Telehealth: Payer: Self-pay | Admitting: *Deleted

## 2016-03-25 NOTE — Telephone Encounter (Signed)
Rec'd call pt had ? About taking the prednisone taht was given, inform pt she will take 3 tabs for 3 days, then drop to 2 tablets for 3 days then 1 tab for 3 days. This will help w/the inflammation in her foot. Pt understood and will take 1st dose now.Marland KitchenLind Guest

## 2016-03-26 NOTE — Assessment & Plan Note (Signed)
stable overall by history and exam, recent data reviewed with pt, and pt to continue medical treatment as before,  to f/u any worsening symptoms or concerns Lab Results  Component Value Date   HGBA1C 6.4 03/23/2016

## 2016-03-26 NOTE — Assessment & Plan Note (Signed)
stable overall by history and exam, recent data reviewed with pt, and pt to continue medical treatment as before,  to f/u any worsening symptoms or concerns BP Readings from Last 3 Encounters:  03/23/16 126/84  03/01/16 109/72  02/08/16 108/60

## 2016-03-26 NOTE — Assessment & Plan Note (Signed)

## 2016-03-26 NOTE — Assessment & Plan Note (Addendum)
New onset, for depomedrol IM 80, toradol IM 30, predpac asd, pain control with oxycodone prn, then start allopurinol daily after improved for prevention,   In addition to the time spent performing CPE, I spent an additional 15 minutes face to face,in which greater than 50% of this time was spent in counseling and coordination of care for patient's illness as documented.

## 2016-04-12 ENCOUNTER — Other Ambulatory Visit: Payer: Medicare Other

## 2016-04-12 DIAGNOSIS — I2510 Atherosclerotic heart disease of native coronary artery without angina pectoris: Secondary | ICD-10-CM

## 2016-04-12 DIAGNOSIS — E7800 Pure hypercholesterolemia, unspecified: Secondary | ICD-10-CM

## 2016-04-12 LAB — LIPID PANEL
CHOL/HDL RATIO: 4.7 ratio — AB (ref 0.0–4.4)
Cholesterol, Total: 168 mg/dL (ref 100–199)
HDL: 36 mg/dL — ABNORMAL LOW (ref >39)
LDL CALC: 109 mg/dL — AB (ref 0–99)
Triglycerides: 117 mg/dL (ref 0–149)
VLDL Cholesterol Cal: 23 mg/dL (ref 5–40)

## 2016-04-12 LAB — HEPATIC FUNCTION PANEL
ALBUMIN: 3.7 g/dL (ref 3.5–4.8)
ALT: 14 IU/L (ref 0–32)
AST: 14 IU/L (ref 0–40)
Alkaline Phosphatase: 91 IU/L (ref 39–117)
BILIRUBIN TOTAL: 0.8 mg/dL (ref 0.0–1.2)
BILIRUBIN, DIRECT: 0.27 mg/dL (ref 0.00–0.40)
Total Protein: 7 g/dL (ref 6.0–8.5)

## 2016-04-13 ENCOUNTER — Encounter: Payer: Self-pay | Admitting: *Deleted

## 2016-04-13 ENCOUNTER — Telehealth: Payer: Self-pay | Admitting: *Deleted

## 2016-04-13 DIAGNOSIS — I2510 Atherosclerotic heart disease of native coronary artery without angina pectoris: Secondary | ICD-10-CM

## 2016-04-13 DIAGNOSIS — E7800 Pure hypercholesterolemia, unspecified: Secondary | ICD-10-CM

## 2016-04-13 MED ORDER — ROSUVASTATIN CALCIUM 40 MG PO TABS: 40.0000 mg | ORAL_TABLET | Freq: Every day | ORAL | 3 refills | Status: DC

## 2016-04-13 NOTE — Telephone Encounter (Signed)
Pt notified of lab results and findings by phone with verbal understanding. When I advised pt that we were recommending a referral to the [facility], pt states to me that she has not taken her Crestor in over 6 months. I advised pt that I will d/w Richardson Dopp, PA for further recommendations, though he will probably have her re-start the Crestor 40 mg daily with repeat labs in about 6-8 weeks. I scheduled pt for her labs to be done 06/03/16. Crestor 40 mg Rx was sent into [facility]. Pt is agreeable to plan of care and is aware if any new recommendations from [place]. PA I will call her back. Pt said thank you for the call today. I will route this message to Richardson Dopp, PA for his review as well.

## 2016-04-13 NOTE — Telephone Encounter (Signed)
-----   Message from Liliane Shi, [place] sent at 04/12/2016  5:22 PM EDT ----- Please call the patient LFTs are normal LDL remains above goal on Crestor 40 mg QD. Refer to [facility] for consideration of PCSK9 inhibitor Rx. Richardson Dopp, PA-C   04/12/2016 5:21 PM

## 2016-04-14 ENCOUNTER — Encounter: Payer: Self-pay | Admitting: Internal Medicine

## 2016-04-14 ENCOUNTER — Ambulatory Visit (INDEPENDENT_AMBULATORY_CARE_PROVIDER_SITE_OTHER): Payer: Medicare Other | Admitting: Internal Medicine

## 2016-04-14 VITALS — BP 118/70 | HR 74 | Temp 98.1°F | Wt 216.0 lb

## 2016-04-14 DIAGNOSIS — E7800 Pure hypercholesterolemia, unspecified: Secondary | ICD-10-CM | POA: Diagnosis not present

## 2016-04-14 DIAGNOSIS — M109 Gout, unspecified: Secondary | ICD-10-CM

## 2016-04-14 DIAGNOSIS — I2510 Atherosclerotic heart disease of native coronary artery without angina pectoris: Secondary | ICD-10-CM

## 2016-04-14 MED ORDER — OXYCODONE HCL 5 MG PO TABS: 5.0000 mg | ORAL_TABLET | Freq: Four times a day (QID) | ORAL | 0 refills | Status: DC | PRN

## 2016-04-14 MED ORDER — METHYLPREDNISOLONE ACETATE 80 MG/ML IJ SUSP
80.0000 mg | Freq: Once | INTRAMUSCULAR | Status: AC
  Administered 2016-04-14: 80 mg via INTRAMUSCULAR

## 2016-04-14 MED ORDER — PREDNISONE 10 MG PO TABS: ORAL_TABLET | ORAL | 0 refills | Status: DC

## 2016-04-14 MED ORDER — ALLOPURINOL 300 MG PO TABS: 300.0000 mg | ORAL_TABLET | Freq: Every day | ORAL | 3 refills | Status: DC

## 2016-04-14 MED ORDER — ROSUVASTATIN CALCIUM 40 MG PO TABS: 40.0000 mg | ORAL_TABLET | Freq: Every day | ORAL | 3 refills | Status: DC

## 2016-04-14 MED ORDER — KETOROLAC TROMETHAMINE 30 MG/ML IJ SOLN
30.0000 mg | Freq: Once | INTRAMUSCULAR | Status: AC
  Administered 2016-04-14: 30 mg via INTRAMUSCULAR

## 2016-04-14 NOTE — Patient Instructions (Signed)
You had the steroid shot today, and the pain shot (toradol)  Please take all new medication as prescribed - the prednisone, and the pain medication  Please start the allopurinol in about 2-3 days and just keep taking  Please continue all other medications as before, and refills have been done if requested.  Please have the pharmacy call with any other refills you may need.  Please keep your appointments with your specialists as you may have planned

## 2016-04-14 NOTE — Assessment & Plan Note (Signed)
Lab Results  Component Value Date   LDLCALC 109 (H) 04/12/2016   stable overall by history and exam, recent data reviewed with pt, and pt to continue medical treatment as before,  to f/u any worsening symptoms or concerns

## 2016-04-14 NOTE — Assessment & Plan Note (Signed)
Right ankle, mod to severe, for toradol 30 IM, depomedrol 80 IM, predpac and pain control asd, ok to start the allopurinol and f/u next visit

## 2016-04-14 NOTE — Progress Notes (Signed)
Subjective:    Patient ID: [name], female    DOB: [birth year], 71 y.o.   MRN: [id number]  HPI  Here to f/u, c/o acut onset right ankle pain/red/swelling on awakening yesterday without trauma or fever.  Did improve with last visit tx but did not take the allopurinol.  Pt denies chest pain, increased sob or doe, wheezing, orthopnea, PND, increased LE swelling, palpitations, dizziness or syncope.  Pt denies new neurological symptoms such as new headache, or facial or extremity weakness or numbness   Pt denies polydipsia, polyuria Past Medical History:  Diagnosis Date  . AAA (abdominal aortic aneurysm) ([place])    a. 4.7cm in 08/2012. Followed by [facility].  . Allergic rhinitis, cause unspecified   . Anxiety state, unspecified   . Chronic LBP   . Coronary artery disease    a. s/p inferior MI 1998->PCI of RCA. b. S/p prior PCI of the OM1 and LAD;  c. 05/2013 Cath/PCI: LM nl, LAD 90p (3.0x22 Resolute DES), 51m w/ patent stent, Diag 40p, 64m, LCX patent OM1 stent, RCA 50p, 80m, EF 65%. d. 10/12 PCI with DESx3 to RCA  . Coronary artery disease involving native heart without angina pectoris 07/31/2006   Qualifier: Diagnosis of  By: [name] MD, [name]  ANGIOGRAPHIC DATA:  1. Ventriculography done in the RAO projection reveals a small wall  motion abnormality in the mid inferior wall. Ejection fraction  would be estimated at around 50%.  2. The right coronary artery has some progressive disease of about 60-  80% in the proximal mid segment. The distal vessel appears to be  50-70% narrowing although may be worse after nitroglycerin was  administered.  3. The left main is free of critical disease.  4. The LAD is widely patent at the stent site with less than 30%  narrowing. There is some diffuse luminal irregularity. The  diagonal branch is somewhat diffusely diseased with about 70%  eccentric focal stenosis proximally.  5. The circumflex has about 30% narrowing. There was an AV circumflex  with about 70% narrowing  supplying a distal marginal branch. The  large marginal branch has been previously stented. The stent  itself is patent but is totally occluded at the end of the stent.  The vess  . Degeneration of lumbar or lumbosacral intervertebral disc   . Depressive disorder, not elsewhere classified   . Diabetes mellitus    TYPE II  . Gout 08/22/2013  . Hyperlipidemia   . Hyperlipidemia 07/31/2006   Qualifier: Diagnosis of  By: [name] MD, [name]   . Hypertension   . Hypothyroidism   . Lower GI bleed 06/2010   Diverticular bleed  . Noncompliance with medications 02/26/2014  . Obesity, unspecified    Past Surgical History:  Procedure Laterality Date  . CARDIAC CATHETERIZATION     PCI OF BOTH THE CIRCUMFLEX AND LEFT ANTERIOR DESCENDING ARTERY  . CARDIAC CATHETERIZATION N/A 10/29/2015   Procedure: Coronary Stent Intervention;  Surgeon: Nelva Bush, MD;  Location: [facility];  Service: Cardiovascular;  Laterality: N/A;  . CARDIAC CATHETERIZATION N/A 10/29/2015   Procedure: Coronary/Graft Angiography;  Surgeon: Nelva Bush, MD;  Location: [facility];  Service: Cardiovascular;  Laterality: N/A;  . CARDIAC CATHETERIZATION N/A 10/29/2015   Procedure: Intravascular Pressure Wire/FFR Study;  Surgeon: Nelva Bush, MD;  Location: [facility];  Service: Cardiovascular;  Laterality: N/A;  . CESAREAN SECTION    . ENDOVASCULAR STENT INSERTION N/A 01/22/2016   Procedure: ABDOMINAL  AORTIC ENDOVASCULAR STENT GRAFT INSERTION;  Surgeon: Rosetta Posner, MD;  Location: [facility];  Service: Vascular;  Laterality: N/A;  . HEART STENT  04-2010  and  Jun 07, 2013   X 3  . LEFT HEART CATHETERIZATION WITH CORONARY ANGIOGRAM N/A 06/07/2013   Procedure: LEFT HEART CATHETERIZATION WITH CORONARY ANGIOGRAM;  Surgeon: [name], MD;  Location: [facility];  Service: Cardiovascular;  Laterality: N/A;  . LEFT HEART CATHETERIZATION WITH CORONARY ANGIOGRAM N/A 02/25/2014   Procedure: LEFT HEART  CATHETERIZATION WITH CORONARY ANGIOGRAM;  Surgeon: Troy Sine, MD;  Location: [facility] C[facility];  Service: Cardiovascular;  Laterality: N/A;  . LUMBAR FUSION  01/2007   DR. Patrice Paradise...3-LEVEL WITH FIXATION  . OOPHORECTOMY     BSO? pt.unsure  . PARATHYROIDECTOMY    . SPINE SURGERY    . THYROIDECTOMY    . TOTAL ABDOMINAL HYSTERECTOMY      reports that she quit smoking about 29 years ago. Her smoking use included Cigarettes. She quit after 30.00 years of use. She has never used smokeless tobacco. She reports that she does not drink alcohol or use drugs. family history includes Arthritis in her maternal aunt; Breast cancer in her maternal aunt; Diabetes in her brother and maternal aunt; Heart attack (age of onset: 53) in her mother; Heart attack (age of onset: 67) in her father; Heart disease in her father and mother. Allergies  Allergen Reactions  . Prilosec [Omeprazole] Other (See Comments)    Chest pain  . Miconazole Nitrate Hives    REACTION: hives  . Augmentin [Amoxicillin-Pot Clavulanate] Hives, Itching and Rash    Has patient had a PCN reaction causing immediate rash, facial/tongue/throat swelling, SOB or lightheadedness with hypotension:unsure Has patient had a PCN reaction causing severe rash involving mucus membranes or skin necrosis:unsure Has patient had a PCN reaction that required hospitalization:No Has patient had a PCN reaction occurring within the last 10 years:NO If all of the above answers are "NO", then may proceed with Cephalosporin use.   [name] Doxycycline Other (See Comments)    REACTION: gi upset   Current Outpatient Prescriptions on File Prior to Visit  Medication Sig Dispense Refill  . aspirin 81 MG EC tablet Take 81 mg by mouth daily.     [name] atenolol (TENORMIN) 25 MG tablet TAKE 1 TABLET(25 MG) BY MOUTH TWICE DAILY 180 tablet 1  . Carboxymethylcellul-Glycerin ([employer] LUBRICATING/DRY EYE OP) Place 1-2 drops into both eyes 3 (three) times daily as needed (for dry eyes.).    [name]  clopidogrel (PLAVIX) 75 MG tablet Take 1 tablet (75 mg total) by mouth every evening.    . colchicine 0.6 MG tablet Take 0.6 mg by mouth 3 (three) times daily as needed (for gout flare up.).     [name] famotidine (PEPCID) 20 MG tablet Take 1 tablet (20 mg total) by mouth daily as needed (for acid reflux.).    [name] gabapentin (NEURONTIN) 300 MG capsule Take 1-2 capsules (300-600 mg total) by mouth daily as needed (for pain/neuropathy). 1-2 tab by mouth at bedtime    . metFORMIN (GLUCOPHAGE-XR) 500 MG 24 hr tablet TAKE 4 TABLETS BY MOUTH EVERY MORNING 360 tablet 0  . nitroGLYCERIN (NITROSTAT) 0.4 MG SL tablet Place 1 tablet (0.4 mg total) under the tongue every 5 (five) minutes as needed for chest pain. 25 tablet 3  . SYNTHROID 150 MCG tablet TAKE 1 TABLET BY MOUTH EVERY DAY 90 tablet 1  . Vitamin D, Ergocalciferol, (DRISDOL) 50000 units CAPS capsule  Take 1 capsule (50,000 Units total) by mouth every 7 (seven) days. 12 capsule 0  . atenolol (TENORMIN) 25 MG tablet Take 1 tablet (25 mg total) by mouth 2 (two) times daily. 180 tablet 3   No current facility-administered medications on file prior to visit.    Review of Systems  Constitutional: Negative for unusual diaphoresis or night sweats HENT: Negative for ear swelling or discharge Eyes: Negative for worsening visual haziness  Respiratory: Negative for choking and stridor.   Gastrointestinal: Negative for distension or worsening eructation Genitourinary: Negative for retention or change in urine volume.  Musculoskeletal: Negative for other MSK pain or swelling Skin: Negative for color change and worsening wound Neurological: Negative for tremors and numbness other than noted  Psychiatric/Behavioral: Negative for decreased concentration or agitation other than above   All other system neg per pt    Objective:   Physical Exam BP 118/70   Pulse 74   Temp 98.1 F (36.7 C)   Wt 216 lb (98 kg)   SpO2 99%   BMI 35.94 kg/m  VS noted,    Constitutional: Pt appears in no apparent distress HENT: Head: NCAT.  Right Ear: External ear normal.  Left Ear: External ear normal.  Eyes: . Pupils are equal, round, and reactive to light. Conjunctivae and EOM are normal Neck: Normal range of motion. Neck supple.  Cardiovascular: Normal rate and regular rhythm.   Pulmonary/Chest: Effort normal and breath sounds without rales or wheezing.  Right ankle with 2-3+ red/tender/swelling Neurological: Pt is alert. Not confused , motor grossly intact Skin: Skin is warm. No rash, no LE edema Psychiatric: Pt behavior is normal. No agitation.  No other exam findings    Assessment & Plan:

## 2016-04-14 NOTE — Progress Notes (Signed)
Pre visit review using our clinic review tool, if applicable. No additional management support is needed unless otherwise documented below in the visit note. 

## 2016-04-14 NOTE — Assessment & Plan Note (Signed)
stable overall by history and exam, and pt to continue medical treatment as before,  to f/u any worsening symptoms or concerns 

## 2016-04-21 ENCOUNTER — Encounter: Payer: Self-pay | Admitting: Physician Assistant

## 2016-05-02 ENCOUNTER — Encounter: Payer: Self-pay | Admitting: Physician Assistant

## 2016-05-02 ENCOUNTER — Ambulatory Visit (INDEPENDENT_AMBULATORY_CARE_PROVIDER_SITE_OTHER): Payer: Medicare Other | Admitting: Physician Assistant

## 2016-05-02 VITALS — BP 100/60 | HR 82 | Ht 65.0 in | Wt 214.8 lb

## 2016-05-02 DIAGNOSIS — F17200 Nicotine dependence, unspecified, uncomplicated: Secondary | ICD-10-CM

## 2016-05-02 DIAGNOSIS — I2510 Atherosclerotic heart disease of native coronary artery without angina pectoris: Secondary | ICD-10-CM | POA: Diagnosis not present

## 2016-05-02 DIAGNOSIS — Z95828 Presence of other vascular implants and grafts: Secondary | ICD-10-CM | POA: Diagnosis not present

## 2016-05-02 DIAGNOSIS — E7800 Pure hypercholesterolemia, unspecified: Secondary | ICD-10-CM | POA: Diagnosis not present

## 2016-05-02 DIAGNOSIS — I10 Essential (primary) hypertension: Secondary | ICD-10-CM

## 2016-05-02 NOTE — Patient Instructions (Addendum)
Medication Instructions:  Your physician recommends that you continue on your current medications as directed. Please refer to the Current Medication list given to you today.   Labwork: NONE ORDERED   Testing/Procedures: NONE ORDERED   Follow-Up: Your physician wants you to follow-up in: 6 MONTHS WITH DR. COOPER You will receive a reminder letter in the mail two months in advance. If you don't receive a letter, please call our office to schedule the follow-up appointment.   Any Other Special Instructions Will Be Listed Below (If Applicable).     If you need a refill on your cardiac medications before your next appointment, please call your pharmacy.   

## 2016-05-02 NOTE — Progress Notes (Signed)
Cardiology Office Note:    Date:  05/02/2016   ID:  [name], DOB 29-Dec-1945, MRN [id number]  PCP:  [name], MD  Cardiologist:  Dr. Sherren Mocha   Electrophysiologist:  n/a [facility]:  Dr. Donnetta Hutching  Referring MD: Biagio Borg, MD   Chief Complaint  Patient presents with  . Coronary Artery Disease    follow up    History of Present Illness:    [name] is a 72 y.o. female with a hx of CAD s/p prior stenting to the LAD and LCx, DM, HTN, HL, AAA. Last LHC in 2/16 demonstrated patent LAD and LCx stents with mild disease in the RCA.  She was admitted in 10/17 with unstable angina. Cardiac catheterization demonstrated hemodynamically significant disease in the RCA by FFR and she was treated with 3 overlapping DES. She underwent stent graft repair of her AAA by Dr. Donnetta Hutching in 1/18. Last seen by Dr. Burt Knack in 1/18.  She is here for Cardiology follow up. She is here alone.  She denies significant chest pain since last seen.  She has taken NTG 2-3 times.  She denies significant shortness of breath.  She denies syncope, orthopnea, PND, edema.    Prior CV studies:   The following studies were reviewed today:  LHC 10/29/15 LM ok LAD prox stent patent with 30 ISR, dist 70, D2 70 LCx mid 60, OM2 stent patent RCA ost 60, ost-prox 50, prox-mid 70, mid 50, RPDA 40 FFR of RCA 0.71 (hemodynamically significant) PCI: 3 x 28 mm Promus, 3 x 16 mm Promus, 3 x 12 mm Promus DES to RCA  Conclusions: 1. Multivessel coronary artery disease, including 60% ostial and 70% proximal/mid RCA disease that is hemodynamically significant by FFR (0.71).  Mild in-stent restenosis of LAD stents, as well as 60% stenosis in the mid LCx adjacent to previously stented large OM are also present. 2. Successful FFR-guided PCI of the RCA from the ostium to the midvessel with three overlapping Promus Premier stents post-dilated to 3.25 mm with 0% residual stenosis and TIMI-3 flow. 3. Patient had significant pain chest  pain beginning with FFR and continuing throughout intervention despite aggressive sedation and analgesia.  No clear angiographic or electrocardiographic etiology was identified.  Pain had returned to pre-cath level by the end of the procedure.   Echo 10/29/15 EF 55-60, no RWMA, Gr 1 DD, MAC   Echo 11/16 EF 55-60, Gr 1 DD   LHC 2/16 LM ok LAD prox stent ok LCx stent ok RCA prox 40 EF 55-60    Abdominal US 12/15 4 x 4 cm AAA   LHC (06/06/13):   prox LAD 90%, mid LAD stent ok with 40% ISR, small caliber Dx prox 40% mid 70% (inf sub-branch), OM1 stent ok, prox RCA 50% ,mid RCA 40%, EF 65%.   PCI:  3.0 x 22 mm Resolute DES to proximal LAD   Echo (02/2012):   EF 55% to 60%. Wall motion was normal.   Past Medical History:  Diagnosis Date  . AAA (abdominal aortic aneurysm) ([place])    a. 4.7cm in 08/2012. Followed by [facility].  . Allergic rhinitis, cause unspecified   . Anxiety state, unspecified   . Chronic LBP   . Coronary artery disease    a. s/p inferior MI 1998->PCI of RCA. b. S/p prior PCI of the OM1 and LAD;  c. 05/2013 Cath/PCI: LM nl, LAD 90p (3.0x22 Resolute DES), 29mw/ patent stent, Diag 40p, 713mLCX patent OM1 stent,  RCA 50p, 60m EF 65%. d. 10/12 PCI with DESx3 to RCA  . Coronary artery disease involving native heart without angina pectoris 07/31/2006   Qualifier: Diagnosis of  By: NLinda HedgesMD, MHeinz Knuckles ANGIOGRAPHIC DATA:  1. Ventriculography done in the RAO projection reveals a small wall  motion abnormality in the mid inferior wall. Ejection fraction  would be estimated at around 50%.  2. The right coronary artery has some progressive disease of about 60-  80% in the proximal mid segment. The distal vessel appears to be  50-70% narrowing although may be worse after nitroglycerin was  administered.  3. The left main is free of critical disease.  4. The LAD is widely patent at the stent site with less than 30%  narrowing. There is some diffuse luminal irregularity. The  diagonal branch  is somewhat diffusely diseased with about 70%  eccentric focal stenosis proximally.  5. The circumflex has about 30% narrowing. There was an AV circumflex  with about 70% narrowing supplying a distal marginal branch. The  large marginal branch has been previously stented. The stent  itself is patent but is totally occluded at the end of the stent.  The vess  . Degeneration of lumbar or lumbosacral intervertebral disc   . Depressive disorder, not elsewhere classified   . Diabetes mellitus    TYPE II  . Gout 08/22/2013  . Hyperlipidemia   . Hyperlipidemia 07/31/2006   Qualifier: Diagnosis of  By: NLinda HedgesMD, MHeinz Knuckles  . Hypertension   . Hypothyroidism   . Lower GI bleed 06/2010   Diverticular bleed  . Noncompliance with medications 02/26/2014  . Obesity, unspecified     Past Surgical History:  Procedure Laterality Date  . CARDIAC CATHETERIZATION     PCI OF BOTH THE CIRCUMFLEX AND LEFT ANTERIOR DESCENDING ARTERY  . CARDIAC CATHETERIZATION N/A 10/29/2015   Procedure: Coronary Stent Intervention;  Surgeon: CNelva Bush MD;  [facility];  Service: Cardiovascular;  Laterality: N/A;  . CARDIAC CATHETERIZATION N/A 10/29/2015   Procedure: Coronary/Graft Angiography;  Surgeon: CNelva Bush MD;  [facility];  Service: Cardiovascular;  Laterality: N/A;  . CARDIAC CATHETERIZATION N/A 10/29/2015   Procedure: Intravascular Pressure Wire/FFR Study;  Surgeon: CNelva Bush MD;  [facility];  Service: Cardiovascular;  Laterality: N/A;  . CESAREAN SECTION    . ENDOVASCULAR STENT INSERTION N/A 01/22/2016   Procedure: ABDOMINAL AORTIC ENDOVASCULAR STENT GRAFT INSERTION;  Surgeon: TRosetta Posner MD;  [place] [facility]  Service: Vascular;  Laterality: N/A;  . HEART STENT  04-2010  and  Jun 07, 2013   X 3  . LEFT HEART CATHETERIZATION WITH CORONARY ANGIOGRAM N/A 06/07/2013   Procedure: LEFT HEART CATHETERIZATION WITH CORONARY ANGIOGRAM;  Surgeon:  CBurnell Blanks MD;  [facility];  Service: Cardiovascular;  Laterality: N/A;  . LEFT HEART CATHETERIZATION WITH CORONARY ANGIOGRAM N/A 02/25/2014   Procedure: LEFT HEART CATHETERIZATION WITH CORONARY ANGIOGRAM;  Surgeon: TTroy Sine MD;  [facility];  Service: Cardiovascular;  Laterality: N/A;  . LUMBAR FUSION  01/2007   DR. CPatrice Paradise..3-LEVEL WITH FIXATION  . OOPHORECTOMY     BSO? pt.unsure  . PARATHYROIDECTOMY    . SPINE SURGERY    . THYROIDECTOMY    . TOTAL ABDOMINAL HYSTERECTOMY      Current Medications: Current Meds  Medication Sig  . allopurinol (ZYLOPRIM) 300 MG tablet Take 1 tablet (300 mg total) by mouth daily.  .[name]  aspirin 81 MG EC tablet Take 81 mg by mouth daily.   [name] atenolol (TENORMIN) 25 MG tablet TAKE 1 TABLET(25 MG) BY MOUTH TWICE DAILY  . Carboxymethylcellul-Glycerin ([employer] LUBRICATING/DRY EYE OP) Place 1-2 drops into both eyes 3 (three) times daily as needed (for dry eyes.).  [name] clopidogrel (PLAVIX) 75 MG tablet Take 1 tablet (75 mg total) by mouth every evening.  . colchicine 0.6 MG tablet Take 0.6 mg by mouth 3 (three) times daily as needed (for gout flare up.).   [name] famotidine (PEPCID) 20 MG tablet Take 1 tablet (20 mg total) by mouth daily as needed (for acid reflux.).  [name] gabapentin (NEURONTIN) 300 MG capsule Take 1-2 capsules (300-600 mg total) by mouth daily as needed (for pain/neuropathy). 1-2 tab by mouth at bedtime  . metFORMIN (GLUCOPHAGE-XR) 500 MG 24 hr tablet TAKE 4 TABLETS BY MOUTH EVERY MORNING  . nitroGLYCERIN (NITROSTAT) 0.4 MG SL tablet Place 1 tablet (0.4 mg total) under the tongue every 5 (five) minutes as needed for chest pain.  [name] oxyCODONE (ROXICODONE) 5 MG immediate release tablet Take 1 tablet (5 mg total) by mouth every 6 (six) hours as needed for severe pain.  . predniSONE (DELTASONE) 10 MG tablet 3 tabs by mouth per day for 3 days,2tabs per day for 3 days,1tab per day for 3 days  . rosuvastatin (CRESTOR) 40 MG tablet Take 1  tablet (40 mg total) by mouth daily.  [name] SYNTHROID 150 MCG tablet TAKE 1 TABLET BY MOUTH EVERY DAY  . Vitamin D, Ergocalciferol, (DRISDOL) 50000 units CAPS capsule Take 1 capsule (50,000 Units total) by mouth every 7 (seven) days.     Allergies:   Prilosec [omeprazole]; Miconazole nitrate; Augmentin [amoxicillin-pot clavulanate]; and Doxycycline   Social History   Social History  . Marital status: Divorced    Spouse name: N/A  . Number of children: 3  . Years of education: N/A   Occupational History  . [employer]    ADMIN SUPPORT  .  Retired   Social History Main Topics  . Smoking status: Former Smoker    Years: 30.00    Types: Cigarettes    Quit date: 05/31/1986  . Smokeless tobacco: Never Used  . Alcohol use No  . Drug use: No  . Sexual activity: Not Currently     Comment: 1st intercourse 71 yo-Fewer than 5 partners   Other Topics Concern  . None   Social History Narrative   DIVORCED   3 CHILDREN   PATIENT SIGNED A DESIGNATED PARTY RELEASE TO ALLOW HER DAUGHTER, TRAMAINE Casebolt, TO HAVE ACCESS TO HER MEDICAL RECORDS/INFORMATION. [name], May 04, 2009 @ 3:27 PM   Smokes cigarettes on rare occasions.     Family History  Problem Relation Age of Onset  . Heart attack Mother 46    s/p D&C-CARDIAC ARREST 1966  . Heart disease Mother   . Heart attack Father 76    1978 WITH MI  . Heart disease Father   . Diabetes Brother   . Diabetes Maternal Aunt   . Arthritis Maternal Aunt   . Breast cancer Maternal Aunt     Post menopausal  . Colon cancer Neg Hx      ROS:   Please see the history of present illness.    Review of Systems  Musculoskeletal: Positive for back pain.   All other systems reviewed and are negative.   EKGs/Labs/Other Test Reviewed:    EKG:  EKG is  ordered today.  The ekg ordered today demonstrates NSR, HR 82, LAD, QTc 448 ms, no change from prior tracing  Recent Labs: 01/22/2016: Magnesium 1.6 03/23/2016: BUN 7; Creatinine,  Ser 0.89; Hemoglobin 12.8; Platelets 328.0; Potassium 3.7; Sodium 137; TSH 0.89 04/12/2016: ALT 14   Recent Lipid Panel    Component Value Date/Time   CHOL 168 04/12/2016 0832   TRIG 117 04/12/2016 0832   TRIG 102 01/02/2006 0825   HDL 36 (L) 04/12/2016 0832   CHOLHDL 4.7 (H) 04/12/2016 0832   CHOLHDL 5 03/23/2016 1528   VLDL 18.2 03/23/2016 1528   LDLCALC 109 (H) 04/12/2016 0832   LDLDIRECT 131.0 03/27/2015 1137     Physical Exam:    VS:  BP 100/60   Pulse 82   Ht 5' 5"  (1.651 m)   Wt 214 lb 12.8 oz (97.4 kg)   BMI 35.74 kg/m     Wt Readings from Last 3 Encounters:  05/02/16 214 lb 12.8 oz (97.4 kg)  04/14/16 216 lb (98 kg)  03/01/16 215 lb (97.5 kg)     Physical Exam  Constitutional: She is oriented to person, place, and time. She appears well-developed and well-nourished. No distress.  HENT:  Head: Normocephalic and atraumatic.  Eyes: No scleral icterus.  Neck: Normal range of motion. No JVD present. Carotid bruit is not present.  Cardiovascular: Normal rate, regular rhythm, S1 normal, S2 normal and normal heart sounds.   No murmur heard. Pulmonary/Chest: Breath sounds normal. She has no wheezes. She has no rhonchi. She has no rales.  Abdominal: Soft. There is no tenderness.  Musculoskeletal: She exhibits no edema.  Neurological: She is alert and oriented to person, place, and time.  Skin: Skin is warm and dry.  Psychiatric: She has a normal mood and affect.    ASSESSMENT:    1. Coronary artery disease involving native coronary artery of native heart without angina pectoris   2. Essential hypertension   3. Pure hypercholesterolemia   4. TOBACCO ABUSE   5. History of endovascular stent graft for abdominal aortic aneurysm (AAA)    PLAN:    In order of problems listed above:  1. Coronary artery disease involving native coronary artery of native heart without angina pectoris -  s/p prior stenting to the LAD and LCx in the past and most recently overlapping  DES x 3 to the RCA in 10/17 in the setting of [place] (no ACS).  She is doing well without significant chest pain.  Continue ASA, Plavix, statin, beta-blocker.   2. Essential hypertension - BP is well controlled on current regimen.    3. Pure hypercholesterolemia - She has been non-adherent to medical Rx.  She plans to get the Crestor soon.    4. TOBACCO ABUSE - She smokes when she is upset.  I recommended complete cessation of tobacco use.    5. History of endovascular stent graft for abdominal aortic aneurysm (AAA) - Continue FU with [facility].    Dispo:  Return in about 6 months (around 11/01/2016) for Routine Follow Up, w/ Dr. Burt Knack.   Medication Adjustments/Labs and Tests Ordered: Current medicines are reviewed at length with the patient today.  Concerns regarding medicines are outlined above.  Medication changes, Labs and Tests ordered today are outlined in the Patient Instructions noted below. Patient Instructions  Medication Instructions:  Your physician recommends that you continue on your current medications as directed. Please refer to the Current Medication list given to you today.  Labwork: NONE ORDERED  Testing/Procedures: NONE ORDERED  Follow-Up: Your physician wants you to follow-up in: [place]. You will receive a reminder letter in the mail two months in advance. If you don't receive a letter, please call our office to schedule the follow-up appointment.  Any Other Special Instructions Will Be Listed Below (If Applicable).  If you need a refill on your cardiac medications before your next appointment, please call your pharmacy.  Signed, Richardson Dopp, PA-C  05/02/2016 11:09 AM    [facility] [address], [place], [place]  [address] Phone: [phone]; Fax: [phone]

## 2016-05-09 ENCOUNTER — Ambulatory Visit: Payer: Medicare Other | Admitting: Physician Assistant

## 2016-05-26 ENCOUNTER — Other Ambulatory Visit: Payer: Self-pay | Admitting: Internal Medicine

## 2016-06-07 ENCOUNTER — Ambulatory Visit (INDEPENDENT_AMBULATORY_CARE_PROVIDER_SITE_OTHER): Payer: Medicare Other | Admitting: Internal Medicine

## 2016-06-07 ENCOUNTER — Encounter: Payer: Self-pay | Admitting: Internal Medicine

## 2016-06-07 VITALS — BP 118/70 | HR 84 | Ht 65.0 in | Wt 226.0 lb

## 2016-06-07 DIAGNOSIS — M791 Myalgia, unspecified site: Secondary | ICD-10-CM

## 2016-06-07 DIAGNOSIS — I10 Essential (primary) hypertension: Secondary | ICD-10-CM | POA: Diagnosis not present

## 2016-06-07 DIAGNOSIS — J019 Acute sinusitis, unspecified: Secondary | ICD-10-CM | POA: Diagnosis not present

## 2016-06-07 MED ORDER — LEVOFLOXACIN 250 MG PO TABS: 250.0000 mg | ORAL_TABLET | Freq: Every day | ORAL | 0 refills | Status: AC

## 2016-06-07 MED ORDER — HYDROCODONE-HOMATROPINE 5-1.5 MG/5ML PO SYRP: 5.0000 mL | ORAL_SOLUTION | Freq: Four times a day (QID) | ORAL | 0 refills | Status: AC | PRN

## 2016-06-07 MED ORDER — KETOROLAC TROMETHAMINE 30 MG/ML IJ SOLN
30.0000 mg | Freq: Once | INTRAMUSCULAR | Status: AC
  Administered 2016-06-07: 30 mg via INTRAMUSCULAR

## 2016-06-07 NOTE — Progress Notes (Signed)
Subjective:    Patient ID: [name], female    DOB: Jul 23, 1945, 71 y.o.   MRN: [id number]  HPI   Here with 2-3 days acute onset fever, facial pain, pressure, headache, general weakness and malaise, and greenish d/c, assoc with marked diffuese myalgias, with mild ST and cough, but pt denies chest pain, wheezing, increased sob or doe, orthopnea, PND, increased LE swelling, palpitations, dizziness or syncope.   Pt has no joint complaints today.  Pt denies polydipsia, polyuria Past Medical History:  Diagnosis Date  . AAA (abdominal aortic aneurysm) ([place])    a. 4.7cm in 08/2012. Followed by [facility].  . Allergic rhinitis, cause unspecified   . Anxiety state, unspecified   . Chronic LBP   . Coronary artery disease    a. s/p inferior MI 1998->PCI of RCA. b. S/p prior PCI of the OM1 and LAD;  c. 05/2013 Cath/PCI: LM nl, LAD 90p (3.0x22 Resolute DES), 76m w/ patent stent, Diag 40p, 68m, LCX patent OM1 stent, RCA 50p, 65m, EF 65%. d. 10/12 PCI with DESx3 to RCA  . Coronary artery disease involving native heart without angina pectoris 07/31/2006   Qualifier: Diagnosis of  By: [name] MD, [name]  ANGIOGRAPHIC DATA:  1. Ventriculography done in the RAO projection reveals a small wall  motion abnormality in the mid inferior wall. Ejection fraction  would be estimated at around 50%.  2. The right coronary artery has some progressive disease of about 60-  80% in the proximal mid segment. The distal vessel appears to be  50-70% narrowing although may be worse after nitroglycerin was  administered.  3. The left main is free of critical disease.  4. The LAD is widely patent at the stent site with less than 30%  narrowing. There is some diffuse luminal irregularity. The  diagonal branch is somewhat diffusely diseased with about 70%  eccentric focal stenosis proximally.  5. The circumflex has about 30% narrowing. There was an AV circumflex  with about 70% narrowing supplying a distal marginal branch. The  large marginal  branch has been previously stented. The stent  itself is patent but is totally occluded at the end of the stent.  The vess  . Degeneration of lumbar or lumbosacral intervertebral disc   . Depressive disorder, not elsewhere classified   . Diabetes mellitus    TYPE II  . Gout 08/22/2013  . Hyperlipidemia   . Hyperlipidemia 07/31/2006   Qualifier: Diagnosis of  By: [name] MD, [name]   . Hypertension   . Hypothyroidism   . Lower GI bleed 06/2010   Diverticular bleed  . Noncompliance with medications 02/26/2014  . Obesity, unspecified    Past Surgical History:  Procedure Laterality Date  . CARDIAC CATHETERIZATION     PCI OF BOTH THE CIRCUMFLEX AND LEFT ANTERIOR DESCENDING ARTERY  . CARDIAC CATHETERIZATION N/A 10/29/2015   Procedure: Coronary Stent Intervention;  Surgeon: Nelva Bush, MD;  Location: [place] [facility];  Service: Cardiovascular;  Laterality: N/A;  . CARDIAC CATHETERIZATION N/A 10/29/2015   Procedure: Coronary/Graft Angiography;  Surgeon: Nelva Bush, MD;  Location: [place] [facility];  Service: Cardiovascular;  Laterality: N/A;  . CARDIAC CATHETERIZATION N/A 10/29/2015   Procedure: Intravascular Pressure Wire/FFR Study;  Surgeon: Nelva Bush, MD;  Location: [place] [facility];  Service: Cardiovascular;  Laterality: N/A;  . CESAREAN SECTION    . ENDOVASCULAR STENT INSERTION N/A 01/22/2016   Procedure: ABDOMINAL AORTIC ENDOVASCULAR STENT GRAFT INSERTION;  Surgeon: Rosetta Posner,  MD;  Location: [place];  Service: Vascular;  Laterality: N/A;  . HEART STENT  04-2010  and  Jun 07, 2013   X 3  . LEFT HEART CATHETERIZATION WITH CORONARY ANGIOGRAM N/A 06/07/2013   Procedure: LEFT HEART CATHETERIZATION WITH CORONARY ANGIOGRAM;  Surgeon: [name], MD;  Location: [facility] CATH LAB;  Service: Cardiovascular;  Laterality: N/A;  . LEFT HEART CATHETERIZATION WITH CORONARY ANGIOGRAM N/A 02/25/2014   Procedure: LEFT HEART CATHETERIZATION WITH CORONARY ANGIOGRAM;  Surgeon: Troy Sine, MD;  Location: [facility] CATH LAB;  Service: Cardiovascular;  Laterality: N/A;  . LUMBAR FUSION  01/2007   DR. Patrice Paradise...3-LEVEL WITH FIXATION  . OOPHORECTOMY     BSO? pt.unsure  . PARATHYROIDECTOMY    . SPINE SURGERY    . THYROIDECTOMY    . TOTAL ABDOMINAL HYSTERECTOMY      reports that she quit smoking about 30 years ago. Her smoking use included Cigarettes. She quit after 30.00 years of use. She has never used smokeless tobacco. She reports that she does not drink alcohol or use drugs. family history includes Arthritis in her maternal aunt; Breast cancer in her maternal aunt; Diabetes in her brother and maternal aunt; Heart attack (age of onset: 39) in her mother; Heart attack (age of onset: 43) in her father; Heart disease in her father and mother. Allergies  Allergen Reactions  . Prilosec [Omeprazole] Other (See Comments)    Chest pain  . Miconazole Nitrate Hives    REACTION: hives  . Augmentin [Amoxicillin-Pot Clavulanate] Hives, Itching and Rash    Has patient had a PCN reaction causing immediate rash, facial/tongue/throat swelling, SOB or lightheadedness with hypotension:unsure Has patient had a PCN reaction causing severe rash involving mucus membranes or skin necrosis:unsure Has patient had a PCN reaction that required hospitalization:No Has patient had a PCN reaction occurring within the last 10 years:NO If all of the above answers are "NO", then may proceed with Cephalosporin use.   [name] Doxycycline Other (See Comments)    REACTION: gi upset   Current Outpatient Prescriptions on File Prior to Visit  Medication Sig Dispense Refill  . allopurinol (ZYLOPRIM) 300 MG tablet Take 1 tablet (300 mg total) by mouth daily. 90 tablet 3  . aspirin 81 MG EC tablet Take 81 mg by mouth daily.     [name] atenolol (TENORMIN) 25 MG tablet TAKE 1 TABLET(25 MG) BY MOUTH TWICE DAILY 180 tablet 1  . Carboxymethylcellul-Glycerin ([employer] LUBRICATING/DRY EYE OP) Place 1-2 drops into both eyes 3 (three) times  daily as needed (for dry eyes.).    [name] clopidogrel (PLAVIX) 75 MG tablet Take 1 tablet (75 mg total) by mouth every evening.    . colchicine 0.6 MG tablet Take 0.6 mg by mouth 3 (three) times daily as needed (for gout flare up.).     [name] famotidine (PEPCID) 20 MG tablet Take 1 tablet (20 mg total) by mouth daily as needed (for acid reflux.).    [name] gabapentin (NEURONTIN) 300 MG capsule Take 1-2 capsules (300-600 mg total) by mouth daily as needed (for pain/neuropathy). 1-2 tab by mouth at bedtime    . metFORMIN (GLUCOPHAGE-XR) 500 MG 24 hr tablet TAKE 4 TABLETS BY MOUTH EVERY MORNING 360 tablet 0  . nitroGLYCERIN (NITROSTAT) 0.4 MG SL tablet Place 1 tablet (0.4 mg total) under the tongue every 5 (five) minutes as needed for chest pain. 25 tablet 3  . oxyCODONE (ROXICODONE) 5 MG immediate release tablet Take 1 tablet (5 mg total) by mouth  every 6 (six) hours as needed for severe pain. 30 tablet 0  . rosuvastatin (CRESTOR) 40 MG tablet Take 1 tablet (40 mg total) by mouth daily. 90 tablet 3  . SYNTHROID 150 MCG tablet Take 1 tablet (150 mcg total) by mouth daily. 90 tablet 2  . Vitamin D, Ergocalciferol, (DRISDOL) 50000 units CAPS capsule Take 1 capsule (50,000 Units total) by mouth every 7 (seven) days. 12 capsule 0   No current facility-administered medications on file prior to visit.    Review of Systems  Constitutional: Negative for other unusual diaphoresis or sweats HENT: Negative for ear discharge or swelling Eyes: Negative for other worsening visual disturbances Respiratory: Negative for stridor or other swelling  Gastrointestinal: Negative for worsening distension or other blood Genitourinary: Negative for retention or other urinary change Musculoskeletal: Negative for other MSK pain or swelling Skin: Negative for color change or other new lesions Neurological: Negative for worsening tremors and other numbness  Psychiatric/Behavioral: Negative for worsening agitation or other fatigue All  other system neg per pt    Objective:   Physical Exam BP 118/70   Pulse 84   Ht 5\' 5"  (1.651 m)   Wt 226 lb (102.5 kg)   SpO2 98%   BMI 37.61 kg/m  VS noted,  Constitutional: Pt appears in NAD HENT: Head: NCAT.  Right Ear: External ear normal.  Left Ear: External ear normal.  Eyes: . Pupils are equal, round, and reactive to light. Conjunctivae and EOM are normal Nose: without d/c or deformity Bilat tm's with mild erythema.  Max sinus areas mild tender.  Pharynx with mild erythema, no exudate Neck: Neck supple. Gross normal ROM Cardiovascular: Normal rate and regular rhythm.   Pulmonary/Chest: Effort normal and breath sounds without rales or wheezing.  No joint effusions or swelling or tender Neurological: Pt is alert. At baseline orientation, motor grossly intact Skin: Skin is warm. No rashes, other new lesions, no LE edema Psychiatric: Pt behavior is normal without agitation  No other exam findings    Assessment & Plan:

## 2016-06-07 NOTE — Assessment & Plan Note (Signed)
Mild to mod, for antibx course,  to f/u any worsening symptoms or concerns 

## 2016-06-07 NOTE — Assessment & Plan Note (Signed)
stable overall by history and exam, recent data reviewed with pt, and pt to continue medical treatment as before,  to f/u any worsening symptoms or concerns BP Readings from Last 3 Encounters:  06/07/16 118/70  05/02/16 100/60  04/14/16 118/70

## 2016-06-07 NOTE — Assessment & Plan Note (Signed)
Likely related to current illness, for toradol 30 IM,  to f/u any worsening symptoms or concerns

## 2016-06-07 NOTE — Patient Instructions (Signed)
You had the Toradol 30 mg shot today for pain  Please take all new medication as prescribed - the antibiotic, and the cough medicine if needed  Please continue all other medications as before, and refills have been done if requested.  Please have the pharmacy call with any other refills you may need.  Please keep your appointments with your specialists as you may have planned

## 2016-06-28 ENCOUNTER — Ambulatory Visit (INDEPENDENT_AMBULATORY_CARE_PROVIDER_SITE_OTHER): Payer: Medicare Other | Admitting: Internal Medicine

## 2016-06-28 VITALS — BP 102/72 | HR 98 | Ht 65.0 in | Wt 210.0 lb

## 2016-06-28 DIAGNOSIS — E11311 Type 2 diabetes mellitus with unspecified diabetic retinopathy with macular edema: Secondary | ICD-10-CM | POA: Diagnosis not present

## 2016-06-28 DIAGNOSIS — J069 Acute upper respiratory infection, unspecified: Secondary | ICD-10-CM

## 2016-06-28 DIAGNOSIS — M545 Low back pain: Secondary | ICD-10-CM | POA: Diagnosis not present

## 2016-06-28 DIAGNOSIS — G8929 Other chronic pain: Secondary | ICD-10-CM | POA: Diagnosis not present

## 2016-06-28 DIAGNOSIS — J309 Allergic rhinitis, unspecified: Secondary | ICD-10-CM | POA: Diagnosis not present

## 2016-06-28 DIAGNOSIS — G4700 Insomnia, unspecified: Secondary | ICD-10-CM

## 2016-06-28 MED ORDER — TRAZODONE HCL 50 MG PO TABS: 25.0000 mg | ORAL_TABLET | Freq: Every evening | ORAL | 1 refills | Status: DC | PRN

## 2016-06-28 MED ORDER — METHYLPREDNISOLONE ACETATE 80 MG/ML IJ SUSP
80.0000 mg | Freq: Once | INTRAMUSCULAR | Status: AC
  Administered 2016-06-28: 80 mg via INTRAMUSCULAR

## 2016-06-28 MED ORDER — KETOROLAC TROMETHAMINE 30 MG/ML IJ SOLN
30.0000 mg | Freq: Once | INTRAMUSCULAR | Status: AC
  Administered 2016-06-28: 30 mg via INTRAMUSCULAR

## 2016-06-28 MED ORDER — ATENOLOL 25 MG PO TABS: ORAL_TABLET | ORAL | 3 refills | Status: DC

## 2016-06-28 MED ORDER — KETOROLAC TROMETHAMINE 30 MG/ML IJ SOLN: 30.0000 mg | Freq: Once | INTRAMUSCULAR | 0 refills | Status: DC

## 2016-06-28 NOTE — Patient Instructions (Signed)
You had the pain and steroid shots today (toradol, and depomedrol)  Please take all new medication as prescribed - the trazodone for sleep  Please continue all other medications as before, and refills have been done if requested.  Please have the pharmacy call with any other refills you may need.  Please continue your efforts at being more active, low cholesterol diabetic diet, and weight control.  Please keep your appointments with your specialists as you may have planned

## 2016-06-28 NOTE — Progress Notes (Signed)
Subjective:    Patient ID: [name], female    DOB: 1945/04/30, 71 y.o.   MRN: [id number]  HPI   Here with 1wk  acute onset fever, facial pain, pressure, headache, general weakness and malaise, and greenish d/c, with mild ST and cough but worst of symptoms were last wk, and now overall much improved, and pt denies chest pain, wheezing, increased sob or doe, orthopnea, PND, increased LE swelling, palpitations, dizziness or syncope. Does have several wks ongoing nasal allergy symptoms with clearish congestion, itch and sneezing, without fever, pain, ST, cough, swelling or wheezing.  Also with worsening insomnia recent, cant get to sleep some nights until 3 am.  Denies worsening depressive symptoms, suicidal ideation, or panic; Pt continues to have recurring LBP without change in severity, bowel or bladder change, fever, wt loss,  worsening LE pain/numbness/weakness, gait change or falls, asks for toradol again today as this works quite well and tolerated before Past Medical History:  Diagnosis Date  . AAA (abdominal aortic aneurysm) ([place])    a. 4.7cm in 08/2012. Followed by [facility].  . Allergic rhinitis, cause unspecified   . Anxiety state, unspecified   . Chronic LBP   . Coronary artery disease    a. s/p inferior MI 1998->PCI of RCA. b. S/p prior PCI of the OM1 and LAD;  c. 05/2013 Cath/PCI: LM nl, LAD 90p (3.0x22 Resolute DES), 54m w/ patent stent, Diag 40p, 38m, LCX patent OM1 stent, RCA 50p, 40m, EF 65%. d. 10/12 PCI with DESx3 to RCA  . Coronary artery disease involving native heart without angina pectoris 07/31/2006   Qualifier: Diagnosis of  By: [name] MD, [name]  ANGIOGRAPHIC DATA:  1. Ventriculography done in the RAO projection reveals a small wall  motion abnormality in the mid inferior wall. Ejection fraction  would be estimated at around 50%.  2. The right coronary artery has some progressive disease of about 60-  80% in the proximal mid segment. The distal vessel appears to be  50-70%  narrowing although may be worse after nitroglycerin was  administered.  3. The left main is free of critical disease.  4. The LAD is widely patent at the stent site with less than 30%  narrowing. There is some diffuse luminal irregularity. The  diagonal branch is somewhat diffusely diseased with about 70%  eccentric focal stenosis proximally.  5. The circumflex has about 30% narrowing. There was an AV circumflex  with about 70% narrowing supplying a distal marginal branch. The  large marginal branch has been previously stented. The stent  itself is patent but is totally occluded at the end of the stent.  The vess  . Degeneration of lumbar or lumbosacral intervertebral disc   . Depressive disorder, not elsewhere classified   . Diabetes mellitus    TYPE II  . Gout 08/22/2013  . Hyperlipidemia   . Hyperlipidemia 07/31/2006   Qualifier: Diagnosis of  By: [name] MD, [name]   . Hypertension   . Hypothyroidism   . Lower GI bleed 06/2010   Diverticular bleed  . Noncompliance with medications 02/26/2014  . Obesity, unspecified    Past Surgical History:  Procedure Laterality Date  . CARDIAC CATHETERIZATION     PCI OF BOTH THE CIRCUMFLEX AND LEFT ANTERIOR DESCENDING ARTERY  . CARDIAC CATHETERIZATION N/A 10/29/2015   Procedure: Coronary Stent Intervention;  Surgeon: Nelva Bush, MD;  Location: [place] [facility];  Service: Cardiovascular;  Laterality: N/A;  . CARDIAC CATHETERIZATION N/A 10/29/2015  Procedure: Coronary/Graft Angiography;  Surgeon: Nelva Bush, MD;  Location: [place] [facility];  Service: Cardiovascular;  Laterality: N/A;  . CARDIAC CATHETERIZATION N/A 10/29/2015   Procedure: Intravascular Pressure Wire/FFR Study;  Surgeon: Nelva Bush, MD;  Location: [place] [facility];  Service: Cardiovascular;  Laterality: N/A;  . CESAREAN SECTION    . ENDOVASCULAR STENT INSERTION N/A 01/22/2016   Procedure: ABDOMINAL AORTIC ENDOVASCULAR STENT GRAFT INSERTION;  Surgeon: Rosetta Posner, MD;   Location: [place];  Service: Vascular;  Laterality: N/A;  . HEART STENT  04-2010  and  Jun 07, 2013   X 3  . LEFT HEART CATHETERIZATION WITH CORONARY ANGIOGRAM N/A 06/07/2013   Procedure: LEFT HEART CATHETERIZATION WITH CORONARY ANGIOGRAM;  Surgeon: [name], MD;  Location: [facility] CATH LAB;  Service: Cardiovascular;  Laterality: N/A;  . LEFT HEART CATHETERIZATION WITH CORONARY ANGIOGRAM N/A 02/25/2014   Procedure: LEFT HEART CATHETERIZATION WITH CORONARY ANGIOGRAM;  Surgeon: Troy Sine, MD;  Location[facility];  Service: Cardiovascular;  Laterality: N/A;  . LUMBAR FUSION  01/2007   DR. Patrice Paradise...3-LEVEL WITH FIXATION  . OOPHORECTOMY     BSO? pt.unsure  . PARATHYROIDECTOMY    . SPINE SURGERY    . THYROIDECTOMY    . TOTAL ABDOMINAL HYSTERECTOMY      reports that she quit smoking about 30 years ago. Her smoking use included Cigarettes. She quit after 30.00 years of use. She has never used smokeless tobacco. She reports that she does not drink alcohol or use drugs. family history includes Arthritis in her maternal aunt; Breast cancer in her maternal aunt; Diabetes in her brother and maternal aunt; Heart attack (age of onset: 51) in her mother; Heart attack (age of onset: 57) in her father; Heart disease in her father and mother. Allergies  Allergen Reactions  . Prilosec [Omeprazole] Other (See Comments)    Chest pain  . Miconazole Nitrate Hives    REACTION: hives  . Augmentin [Amoxicillin-Pot Clavulanate] Hives, Itching and Rash    Has patient had a PCN reaction causing immediate rash, facial/tongue/throat swelling, SOB or lightheadedness with hypotension:unsure Has patient had a PCN reaction causing severe rash involving mucus membranes or skin necrosis:unsure Has patient had a PCN reaction that required hospitalization:No Has patient had a PCN reaction occurring within the last 10 years:NO If all of the above answers are "NO", then may proceed with Cephalosporin use.   [name]  Doxycycline Other (See Comments)    REACTION: gi upset   Current Outpatient Prescriptions on File Prior to Visit  Medication Sig Dispense Refill  . allopurinol (ZYLOPRIM) 300 MG tablet Take 1 tablet (300 mg total) by mouth daily. 90 tablet 3  . aspirin 81 MG EC tablet Take 81 mg by mouth daily.     . Carboxymethylcellul-Glycerin ([employer] LUBRICATING/DRY EYE OP) Place 1-2 drops into both eyes 3 (three) times daily as needed (for dry eyes.).    [name] clopidogrel (PLAVIX) 75 MG tablet Take 1 tablet (75 mg total) by mouth every evening.    . colchicine 0.6 MG tablet Take 0.6 mg by mouth 3 (three) times daily as needed (for gout flare up.).     [name] famotidine (PEPCID) 20 MG tablet Take 1 tablet (20 mg total) by mouth daily as needed (for acid reflux.).    [name] gabapentin (NEURONTIN) 300 MG capsule Take 1-2 capsules (300-600 mg total) by mouth daily as needed (for pain/neuropathy). 1-2 tab by mouth at bedtime    . metFORMIN (GLUCOPHAGE-XR) 500 MG  24 hr tablet TAKE 4 TABLETS BY MOUTH EVERY MORNING 360 tablet 0  . nitroGLYCERIN (NITROSTAT) 0.4 MG SL tablet Place 1 tablet (0.4 mg total) under the tongue every 5 (five) minutes as needed for chest pain. 25 tablet 3  . oxyCODONE (ROXICODONE) 5 MG immediate release tablet Take 1 tablet (5 mg total) by mouth every 6 (six) hours as needed for severe pain. 30 tablet 0  . rosuvastatin (CRESTOR) 40 MG tablet Take 1 tablet (40 mg total) by mouth daily. 90 tablet 3  . SYNTHROID 150 MCG tablet Take 1 tablet (150 mcg total) by mouth daily. 90 tablet 2  . Vitamin D, Ergocalciferol, (DRISDOL) 50000 units CAPS capsule Take 1 capsule (50,000 Units total) by mouth every 7 (seven) days. 12 capsule 0   No current facility-administered medications on file prior to visit.    Review of Systems  Constitutional: Negative for other unusual diaphoresis or sweats HENT: Negative for ear discharge or swelling Eyes: Negative for other worsening visual disturbances Respiratory: Negative for  stridor or other swelling  Gastrointestinal: Negative for worsening distension or other blood Genitourinary: Negative for retention or other urinary change Musculoskeletal: Negative for other MSK pain or swelling Skin: Negative for color change or other new lesions Neurological: Negative for worsening tremors and other numbness  Psychiatric/Behavioral: Negative for worsening agitation or other fatigue All other system neg per pt    Objective:   Physical Exam BP 102/72   Pulse 98   Ht 5\' 5"  (1.651 m)   Wt 210 lb (95.3 kg)   SpO2 100%   BMI 34.95 kg/m  VS noted, mild ill Constitutional: Pt appears in NAD HENT: Head: NCAT.  Right Ear: External ear normal.  Left Ear: External ear normal.  Eyes: . Pupils are equal, round, and reactive to light. Conjunctivae and EOM are normal Bilat tm's with mild erythema.  Max sinus areas non tender.  Pharynx with mild erythema, no exudate Nose: without d/c or deformity Neck: Neck supple. Gross normal ROM Cardiovascular: Normal rate and regular rhythm.   Pulmonary/Chest: Effort normal and breath sounds without rales or wheezing.  Abd:  Soft, NT, ND, + BS, no organomegaly Neurological: Pt is alert. At baseline orientation, motor grossly intact Skin: Skin is warm. No rashes, other new lesions, no LE edema Psychiatric: Pt behavior is normal without agitation , mild nervous Spine: nontender midline, without swelling or rash No other exam findings  Lab Results  Component Value Date   WBC 9.4 03/23/2016   HGB 12.8 03/23/2016   HCT 39.3 03/23/2016   PLT 328.0 03/23/2016   GLUCOSE 130 (H) 03/23/2016   CHOL 168 04/12/2016   TRIG 117 04/12/2016   HDL 36 (L) 04/12/2016   LDLDIRECT 131.0 03/27/2015   LDLCALC 109 (H) 04/12/2016   ALT 14 04/12/2016   AST 14 04/12/2016   NA 137 03/23/2016   K 3.7 03/23/2016   CL 101 03/23/2016   CREATININE 0.89 03/23/2016   BUN 7 03/23/2016   CO2 29 03/23/2016   TSH 0.89 03/23/2016   INR 1.08 01/22/2016    HGBA1C 6.4 03/23/2016   MICROALBUR 3.5 (H) 03/23/2016        Assessment & Plan:

## 2016-06-29 NOTE — Assessment & Plan Note (Signed)
With mild acute flare possibly related to recent illness I suspect, for toradol IM 30,  to f/u any worsening symptoms or concerns

## 2016-06-29 NOTE — Assessment & Plan Note (Signed)
Resolving nicely this time, likely viral, ok to hold further antibx at this time

## 2016-06-29 NOTE — Assessment & Plan Note (Signed)
Mild to mod seasonal flare, for depomedrol IM 80, to f/u any worsening symptoms or concerns 

## 2016-06-29 NOTE — Assessment & Plan Note (Signed)
Mild to mod, for trazodone qhs prn,  to f/u any worsening symptoms or concerns 

## 2016-06-29 NOTE — Assessment & Plan Note (Signed)
stable overall by history and exam, recent data reviewed with pt, and pt to continue medical treatment as before,  to f/u any worsening symptoms or concerns Lab Results  Component Value Date   HGBA1C 6.4 03/23/2016  pt to call for onset polys ir cbg > 200

## 2016-07-05 ENCOUNTER — Ambulatory Visit (INDEPENDENT_AMBULATORY_CARE_PROVIDER_SITE_OTHER): Payer: Medicare Other | Admitting: Internal Medicine

## 2016-07-05 ENCOUNTER — Other Ambulatory Visit (INDEPENDENT_AMBULATORY_CARE_PROVIDER_SITE_OTHER): Payer: Medicare Other

## 2016-07-05 VITALS — BP 106/64 | HR 74 | Ht 65.0 in | Wt 209.0 lb

## 2016-07-05 DIAGNOSIS — B373 Candidiasis of vulva and vagina: Secondary | ICD-10-CM

## 2016-07-05 DIAGNOSIS — R350 Frequency of micturition: Secondary | ICD-10-CM | POA: Diagnosis not present

## 2016-07-05 DIAGNOSIS — R1030 Lower abdominal pain, unspecified: Secondary | ICD-10-CM | POA: Insufficient documentation

## 2016-07-05 DIAGNOSIS — M545 Low back pain, unspecified: Secondary | ICD-10-CM

## 2016-07-05 DIAGNOSIS — B3731 Acute candidiasis of vulva and vagina: Secondary | ICD-10-CM | POA: Insufficient documentation

## 2016-07-05 LAB — URINALYSIS, ROUTINE W REFLEX MICROSCOPIC
Bilirubin Urine: NEGATIVE
Ketones, ur: NEGATIVE
Leukocytes, UA: NEGATIVE
Nitrite: NEGATIVE
SPECIFIC GRAVITY, URINE: 1.02 (ref 1.000–1.030)
Total Protein, Urine: NEGATIVE
Urine Glucose: NEGATIVE
Urobilinogen, UA: 0.2 (ref 0.0–1.0)
pH: 5.5 (ref 5.0–8.0)

## 2016-07-05 MED ORDER — FLUCONAZOLE 150 MG PO TABS: ORAL_TABLET | ORAL | 1 refills | Status: DC

## 2016-07-05 NOTE — Assessment & Plan Note (Signed)
Mild to mod, for diflucan course,  to f/u any worsening symptoms or concerns 

## 2016-07-05 NOTE — Assessment & Plan Note (Signed)
stable overall by history and exam, and pt to continue medical treatment as before,  to f/u any worsening symptoms or concerns 

## 2016-07-05 NOTE — Assessment & Plan Note (Signed)
Also for ua and cx, r/o bacterial, but suspect related to above

## 2016-07-05 NOTE — Progress Notes (Signed)
Subjective:    Patient ID: [name], female    DOB: December 15, 1945, 71 y.o.   MRN: [id number]  HPI  Here to f/u after recent antibx tx, now with vaginal yeast like d/c complaints of burning itching thick sort of d/c for 2-3 days.  No fever, and Denies worsening reflux, abd pain, dysphagia, n/v, bowel change or blood.  Also does have urinary symptoms such as dysuria and frequency, but no urgency, flank pain, hematuria or n/v, fever, chills. Pt denies chest pain, increased sob or doe, wheezing, orthopnea, PND, increased LE swelling, palpitations, dizziness or syncope.  Pt denies polydipsia, polyuria.  Pt continues to have recurring LBP without change in severity, bowel or bladder change, fever, wt loss,  worsening LE pain/numbness/weakness, gait change or falls. Past Medical History:  Diagnosis Date  . AAA (abdominal aortic aneurysm) ([place])    a. 4.7cm in 08/2012. Followed by [facility].  . Allergic rhinitis, cause unspecified   . Anxiety state, unspecified   . Chronic LBP   . Coronary artery disease    a. s/p inferior MI 1998->PCI of RCA. b. S/p prior PCI of the OM1 and LAD;  c. 05/2013 Cath/PCI: LM nl, LAD 90p (3.0x22 Resolute DES), 20m w/ patent stent, Diag 40p, 95m, LCX patent OM1 stent, RCA 50p, 35m, EF 65%. d. 10/12 PCI with DESx3 to RCA  . Coronary artery disease involving native heart without angina pectoris 07/31/2006   Qualifier: Diagnosis of  By: [name] MD, [name]  ANGIOGRAPHIC DATA:  1. Ventriculography done in the RAO projection reveals a small wall  motion abnormality in the mid inferior wall. Ejection fraction  would be estimated at around 50%.  2. The right coronary artery has some progressive disease of about 60-  80% in the proximal mid segment. The distal vessel appears to be  50-70% narrowing although may be worse after nitroglycerin was  administered.  3. The left main is free of critical disease.  4. The LAD is widely patent at the stent site with less than 30%  narrowing. There is some  diffuse luminal irregularity. The  diagonal branch is somewhat diffusely diseased with about 70%  eccentric focal stenosis proximally.  5. The circumflex has about 30% narrowing. There was an AV circumflex  with about 70% narrowing supplying a distal marginal branch. The  large marginal branch has been previously stented. The stent  itself is patent but is totally occluded at the end of the stent.  The vess  . Degeneration of lumbar or lumbosacral intervertebral disc   . Depressive disorder, not elsewhere classified   . Diabetes mellitus    TYPE II  . Gout 08/22/2013  . Hyperlipidemia   . Hyperlipidemia 07/31/2006   Qualifier: Diagnosis of  By: [name] MD, [name]   . Hypertension   . Hypothyroidism   . Lower GI bleed 06/2010   Diverticular bleed  . Noncompliance with medications 02/26/2014  . Obesity, unspecified    Past Surgical History:  Procedure Laterality Date  . CARDIAC CATHETERIZATION     PCI OF BOTH THE CIRCUMFLEX AND LEFT ANTERIOR DESCENDING ARTERY  . CARDIAC CATHETERIZATION N/A 10/29/2015   Procedure: Coronary Stent Intervention;  Surgeon: Nelva Bush, MD;  Location: [facility];  Service: Cardiovascular;  Laterality: N/A;  . CARDIAC CATHETERIZATION N/A 10/29/2015   Procedure: Coronary/Graft Angiography;  Surgeon: Nelva Bush, MD;  Location: [facility];  Service: Cardiovascular;  Laterality: N/A;  . CARDIAC CATHETERIZATION N/A 10/29/2015   Procedure:  Intravascular Pressure Wire/FFR Study;  Surgeon: Nelva Bush, MD;  Location: [facility];  Service: Cardiovascular;  Laterality: N/A;  . CESAREAN SECTION    . ENDOVASCULAR STENT INSERTION N/A 01/22/2016   Procedure: ABDOMINAL AORTIC ENDOVASCULAR STENT GRAFT INSERTION;  Surgeon: Rosetta Posner, MD;  Location: [facility];  Service: Vascular;  Laterality: N/A;  . HEART STENT  04-2010  and  Jun 07, 2013   X 3  . LEFT HEART CATHETERIZATION WITH CORONARY ANGIOGRAM N/A 06/07/2013   Procedure: LEFT HEART  CATHETERIZATION WITH CORONARY ANGIOGRAM;  Surgeon: [name], MD;  Location: [facility];  Service: Cardiovascular;  Laterality: N/A;  . LEFT HEART CATHETERIZATION WITH CORONARY ANGIOGRAM N/A 02/25/2014   Procedure: LEFT HEART CATHETERIZATION WITH CORONARY ANGIOGRAM;  Surgeon: Troy Sine, MD;  Location: [facility] CATH LAB;  Service: Cardiovascular;  Laterality: N/A;  . LUMBAR FUSION  01/2007   DR. Patrice Paradise...3-LEVEL WITH FIXATION  . OOPHORECTOMY     BSO? pt.unsure  . PARATHYROIDECTOMY    . SPINE SURGERY    . THYROIDECTOMY    . TOTAL ABDOMINAL HYSTERECTOMY      reports that she quit smoking about 30 years ago. Her smoking use included Cigarettes. She quit after 30.00 years of use. She has never used smokeless tobacco. She reports that she does not drink alcohol or use drugs. family history includes Arthritis in her maternal aunt; Breast cancer in her maternal aunt; Diabetes in her brother and maternal aunt; Heart attack (age of onset: 67) in her mother; Heart attack (age of onset: 35) in her father; Heart disease in her father and mother. Allergies  Allergen Reactions  . Prilosec [Omeprazole] Other (See Comments)    Chest pain  . Miconazole Nitrate Hives    REACTION: hives  . Augmentin [Amoxicillin-Pot Clavulanate] Hives, Itching and Rash    Has patient had a PCN reaction causing immediate rash, facial/tongue/throat swelling, SOB or lightheadedness with hypotension:unsure Has patient had a PCN reaction causing severe rash involving mucus membranes or skin necrosis:unsure Has patient had a PCN reaction that required hospitalization:No Has patient had a PCN reaction occurring within the last 10 years:NO If all of the above answers are "NO", then may proceed with Cephalosporin use.   [name] Doxycycline Other (See Comments)    REACTION: gi upset   Current Outpatient Prescriptions on File Prior to Visit  Medication Sig Dispense Refill  . allopurinol (ZYLOPRIM) 300 MG tablet Take 1 tablet  (300 mg total) by mouth daily. 90 tablet 3  . aspirin 81 MG EC tablet Take 81 mg by mouth daily.     [name] atenolol (TENORMIN) 25 MG tablet TAKE 1 TABLET(25 MG) BY MOUTH TWICE DAILY 180 tablet 3  . Carboxymethylcellul-Glycerin ([facility] LUBRICATING/DRY EYE OP) Place 1-2 drops into both eyes 3 (three) times daily as needed (for dry eyes.).    [name] clopidogrel (PLAVIX) 75 MG tablet Take 1 tablet (75 mg total) by mouth every evening.    . colchicine 0.6 MG tablet Take 0.6 mg by mouth 3 (three) times daily as needed (for gout flare up.).     [name] famotidine (PEPCID) 20 MG tablet Take 1 tablet (20 mg total) by mouth daily as needed (for acid reflux.).    [name] gabapentin (NEURONTIN) 300 MG capsule Take 1-2 capsules (300-600 mg total) by mouth daily as needed (for pain/neuropathy). 1-2 tab by mouth at bedtime    . metFORMIN (GLUCOPHAGE-XR) 500 MG 24 hr tablet TAKE 4 TABLETS BY MOUTH EVERY MORNING 360  tablet 0  . nitroGLYCERIN (NITROSTAT) 0.4 MG SL tablet Place 1 tablet (0.4 mg total) under the tongue every 5 (five) minutes as needed for chest pain. 25 tablet 3  . oxyCODONE (ROXICODONE) 5 MG immediate release tablet Take 1 tablet (5 mg total) by mouth every 6 (six) hours as needed for severe pain. 30 tablet 0  . rosuvastatin (CRESTOR) 40 MG tablet Take 1 tablet (40 mg total) by mouth daily. 90 tablet 3  . SYNTHROID 150 MCG tablet Take 1 tablet (150 mcg total) by mouth daily. 90 tablet 2  . traZODone (DESYREL) 50 MG tablet Take 0.5-1 tablets (25-50 mg total) by mouth at bedtime as needed for sleep. 90 tablet 1  . Vitamin D, Ergocalciferol, (DRISDOL) 50000 units CAPS capsule Take 1 capsule (50,000 Units total) by mouth every 7 (seven) days. 12 capsule 0   No current facility-administered medications on file prior to visit.    Review of Systems  Constitutional: Negative for other unusual diaphoresis or sweats HENT: Negative for ear discharge or swelling Eyes: Negative for other worsening visual disturbances Respiratory:  Negative for stridor or other swelling  Gastrointestinal: Negative for worsening distension or other blood Genitourinary: Negative for retention or other urinary change Musculoskeletal: Negative for other MSK pain or swelling Skin: Negative for color change or other new lesions Neurological: Negative for worsening tremors and other numbness  Psychiatric/Behavioral: Negative for worsening agitation or other fatigue All other system neg per pt    Objective:   Physical Exam BP 106/64   Pulse 74   Ht 5\' 5"  (1.651 m)   Wt 209 lb (94.8 kg)   SpO2 99%   BMI 34.78 kg/m  VS noted, not ill appearing Constitutional: Pt appears in NAD HENT: Head: NCAT.  Right Ear: External ear normal.  Left Ear: External ear normal.  Eyes: . Pupils are equal, round, and reactive to light. Conjunctivae and EOM are normal Nose: without d/c or deformity Neck: Neck supple. Gross normal ROM Cardiovascular: Normal rate and regular rhythm.   Pulmonary/Chest: Effort normal and breath sounds without rales or wheezing.  Abd:  Soft, NT, ND, + BS, no organomegaly Neurological: Pt is alert. At baseline orientation, motor grossly intact Skin: Skin is warm. No rashes, other new lesions, no LE edema Psychiatric: Pt behavior is normal without agitation  No other exam findings    Assessment & Plan:

## 2016-07-05 NOTE — Patient Instructions (Addendum)
Please take all new medication as prescribed - the diflucan  Please continue all other medications as before, and refills have been done if requested.  Please have the pharmacy call with any other refills you may need.  Please continue your efforts at being more active, low cholesterol diet, and weight control.  Please keep your appointments with your specialists as you may have planned  Please go to the LAB in the Basement (turn left off the elevator) for the tests to be done today - just the urine testing today  You will be contacted by phone if any changes need to be made immediately.  Otherwise, you will receive a letter about your results with an explanation, but please check with MyChart first.  Please remember to sign up for MyChart if you have not done so, as this will be important to you in the future with finding out test results, communicating by private email, and scheduling acute appointments online when needed.

## 2016-07-06 LAB — URINE CULTURE

## 2016-07-07 ENCOUNTER — Encounter: Payer: Self-pay | Admitting: Internal Medicine

## 2016-07-07 LAB — HM DIABETES EYE EXAM

## 2016-08-11 ENCOUNTER — Encounter: Payer: Self-pay | Admitting: Vascular Surgery

## 2016-08-19 ENCOUNTER — Ambulatory Visit (INDEPENDENT_AMBULATORY_CARE_PROVIDER_SITE_OTHER): Payer: Medicare Other | Admitting: Cardiovascular Disease

## 2016-08-19 ENCOUNTER — Encounter: Payer: Self-pay | Admitting: Cardiovascular Disease

## 2016-08-19 VITALS — BP 106/64 | HR 82 | Ht 65.0 in | Wt 212.0 lb

## 2016-08-19 DIAGNOSIS — I25119 Atherosclerotic heart disease of native coronary artery with unspecified angina pectoris: Secondary | ICD-10-CM

## 2016-08-19 DIAGNOSIS — E7800 Pure hypercholesterolemia, unspecified: Secondary | ICD-10-CM

## 2016-08-19 MED ORDER — ATORVASTATIN CALCIUM 10 MG PO TABS
10.0000 mg | ORAL_TABLET | Freq: Every day | ORAL | 3 refills | Status: DC
Start: 2016-08-19 — End: 2016-11-15

## 2016-08-19 NOTE — Patient Instructions (Signed)
Medication Instructions:  Your physician has recommended you make the following change in your medication:  1. STOP Crestor (Rosuvastatin) 2. START Atorvastatin 10mg  take one tablet by mouth daily  Labwork: Your physician recommends that you return for a FASTING LIPID and LIVER in 3 MONTHS--nothing to eat or drink after midnight, lab opens at 7:30 AM  Testing/Procedures: No new orders.   Follow-Up: Your physician wants you to follow-up in: 6 MONTHS with Dr Burt Knack.  You will receive a reminder letter in the mail two months in advance. If you don't receive a letter, please call our office to schedule the follow-up appointment.   Any Other Special Instructions Will Be Listed Below (If Applicable).     If you need a refill on your cardiac medications before your next appointment, please call your pharmacy.

## 2016-08-19 NOTE — Progress Notes (Signed)
Cardiology Office Note Date:  08/19/2016   ID:  SONNET RIZOR, DOB Feb 15, 1945, MRN [id number]  PCP:  Biagio Borg, MD  Cardiologist:  Sherren Mocha, MD    Chief Complaint  Patient presents with  . Follow-up    cad     History of Present Illness: [name] is a 71 y.o. female who presents for Follow-up of coronary artery disease. She has been followed for CAD and has undergone stenting of the LAD and left circumflex. Other cardiovascular problems include hypertension, diabetes, hyperlipidemia, and abdominal aortic aneurysm. She was admitted with unstable angina in October 2017 and underwent FFR guided PCI of the right coronary artery treated with 3 overlapping drug-eluting stents.  The patient underwent endovascular repair of an abdominal aortic aneurysm in January 2018. She's been having soreness in the left arm and left axillary/chest region. Symptoms are not related to exertion. Thinks it bothers her most when she sleeps on her left side. She doesn't do much physical activity but has not had any exertional chest or arm pain. She denies shortness of breath. She primarily complains of profound fatigue. She's very sedentary and is quite limited by her back pain. She's not able to do much walking. She smokes intermittently when she has stress or emotional issues. States she hasn't been smoking as much recently. She's not been taking her statin drug because it makes her feel bad.   Past Medical History:  Diagnosis Date  . AAA (abdominal aortic aneurysm) ([place])    a. 4.7cm in 08/2012. Followed by [facility].  . Allergic rhinitis, cause unspecified   . Anxiety state, unspecified   . Chronic LBP   . Coronary artery disease    a. s/p inferior MI 1998->PCI of RCA. b. S/p prior PCI of the OM1 and LAD;  c. 05/2013 Cath/PCI: LM nl, LAD 90p (3.0x22 Resolute DES), 90m w/ patent stent, Diag 40p, 33m, LCX patent OM1 stent, RCA 50p, 1m, EF 65%. d. 10/12 PCI with DESx3 to RCA  . Coronary artery disease  involving native heart without angina pectoris 07/31/2006   Qualifier: Diagnosis of  By: [name] MD, [name]  ANGIOGRAPHIC DATA:  1. Ventriculography done in the RAO projection reveals a small wall  motion abnormality in the mid inferior wall. Ejection fraction  would be estimated at around 50%.  2. The right coronary artery has some progressive disease of about 60-  80% in the proximal mid segment. The distal vessel appears to be  50-70% narrowing although may be worse after nitroglycerin was  administered.  3. The left main is free of critical disease.  4. The LAD is widely patent at the stent site with less than 30%  narrowing. There is some diffuse luminal irregularity. The  diagonal branch is somewhat diffusely diseased with about 70%  eccentric focal stenosis proximally.  5. The circumflex has about 30% narrowing. There was an AV circumflex  with about 70% narrowing supplying a distal marginal branch. The  large marginal branch has been previously stented. The stent  itself is patent but is totally occluded at the end of the stent.  The vess  . Degeneration of lumbar or lumbosacral intervertebral disc   . Depressive disorder, not elsewhere classified   . Diabetes mellitus    TYPE II  . Gout 08/22/2013  . Hyperlipidemia   . Hyperlipidemia 07/31/2006   Qualifier: Diagnosis of  By: [name] MD, [name]   . Hypertension   . Hypothyroidism   . Lower  GI bleed 06/2010   Diverticular bleed  . Noncompliance with medications 02/26/2014  . Obesity, unspecified     Past Surgical History:  Procedure Laterality Date  . CARDIAC CATHETERIZATION     PCI OF BOTH THE CIRCUMFLEX AND LEFT ANTERIOR DESCENDING ARTERY  . CARDIAC CATHETERIZATION N/A 10/29/2015   Procedure: Coronary Stent Intervention;  Surgeon: Nelva Bush, MD;  Location: [facility];  Service: Cardiovascular;  Laterality: N/A;  . CARDIAC CATHETERIZATION N/A 10/29/2015   Procedure: Coronary/Graft Angiography;  Surgeon: Nelva Bush,  MD;  Location: [facility];  Service: Cardiovascular;  Laterality: N/A;  . CARDIAC CATHETERIZATION N/A 10/29/2015   Procedure: Intravascular Pressure Wire/FFR Study;  Surgeon: Nelva Bush, MD;  Location: [facility];  Service: Cardiovascular;  Laterality: N/A;  . CESAREAN SECTION    . ENDOVASCULAR STENT INSERTION N/A 01/22/2016   Procedure: ABDOMINAL AORTIC ENDOVASCULAR STENT GRAFT INSERTION;  Surgeon: Rosetta Posner, MD;  Location: [facility];  Service: Vascular;  Laterality: N/A;  . HEART STENT  04-2010  and  Jun 07, 2013   X 3  . LEFT HEART CATHETERIZATION WITH CORONARY ANGIOGRAM N/A 06/07/2013   Procedure: LEFT HEART CATHETERIZATION WITH CORONARY ANGIOGRAM;  Surgeon: [name], MD;  Location: [facility];  Service: Cardiovascular;  Laterality: N/A;  . LEFT HEART CATHETERIZATION WITH CORONARY ANGIOGRAM N/A 02/25/2014   Procedure: LEFT HEART CATHETERIZATION WITH CORONARY ANGIOGRAM;  Surgeon: Troy Sine, MD;  Location: [facility] CATH LAB;  Service: Cardiovascular;  Laterality: N/A;  . LUMBAR FUSION  01/2007   DR. Patrice Paradise...3-LEVEL WITH FIXATION  . OOPHORECTOMY     BSO? pt.unsure  . PARATHYROIDECTOMY    . SPINE SURGERY    . THYROIDECTOMY    . TOTAL ABDOMINAL HYSTERECTOMY      Current Outpatient Prescriptions  Medication Sig Dispense Refill  . allopurinol (ZYLOPRIM) 300 MG tablet Take 1 tablet (300 mg total) by mouth daily. 90 tablet 3  . aspirin 81 MG EC tablet Take 81 mg by mouth daily.     [name] atenolol (TENORMIN) 25 MG tablet TAKE 1 TABLET(25 MG) BY MOUTH TWICE DAILY 180 tablet 3  . Carboxymethylcellul-Glycerin ([facility] LUBRICATING/DRY EYE OP) Place 1-2 drops into both eyes 3 (three) times daily as needed (for dry eyes.).    [name] clopidogrel (PLAVIX) 75 MG tablet Take 1 tablet (75 mg total) by mouth every evening.    . colchicine 0.6 MG tablet Take 0.6 mg by mouth 3 (three) times daily as needed (for gout flare up.).     [name] famotidine (PEPCID) 20 MG tablet Take 1 tablet (20 mg  total) by mouth daily as needed (for acid reflux.).    [name] fluconazole (DIFLUCAN) 150 MG tablet Take 150 mg by mouth as directed.    . gabapentin (NEURONTIN) 300 MG capsule Take 1-2 capsules (300-600 mg total) by mouth daily as needed (for pain/neuropathy). 1-2 tab by mouth at bedtime    . metFORMIN (GLUCOPHAGE-XR) 500 MG 24 hr tablet TAKE 4 TABLETS BY MOUTH EVERY MORNING 360 tablet 0  . nitroGLYCERIN (NITROSTAT) 0.4 MG SL tablet Place 1 tablet (0.4 mg total) under the tongue every 5 (five) minutes as needed for chest pain. 25 tablet 3  . oxyCODONE (ROXICODONE) 5 MG immediate release tablet Take 1 tablet (5 mg total) by mouth every 6 (six) hours as needed for severe pain. 30 tablet 0  . SYNTHROID 150 MCG tablet Take 1 tablet (150 mcg total) by mouth daily. 90 tablet 2  .  traZODone (DESYREL) 50 MG tablet Take 0.5-1 tablets (25-50 mg total) by mouth at bedtime as needed for sleep. 90 tablet 1  . Vitamin D, Ergocalciferol, (DRISDOL) 50000 units CAPS capsule Take 1 capsule (50,000 Units total) by mouth every 7 (seven) days. 12 capsule 0  . atorvastatin (LIPITOR) 10 MG tablet Take 1 tablet (10 mg total) by mouth daily. 90 tablet 3   No current facility-administered medications for this visit.     Allergies:   Prilosec [omeprazole]; Miconazole nitrate; Augmentin [amoxicillin-pot clavulanate]; and Doxycycline   Social History:  The patient  reports that she quit smoking about 30 years ago. Her smoking use included Cigarettes. She quit after 30.00 years of use. She has never used smokeless tobacco. She reports that she does not drink alcohol or use drugs.   Family History:  The patient's  family history includes Arthritis in her maternal aunt; Breast cancer in her maternal aunt; Diabetes in her brother and maternal aunt; Heart attack (age of onset: 41) in her mother; Heart attack (age of onset: 52) in her father; Heart disease in her father and mother.   ROS:  Please see the history of present illness.   All other systems are reviewed and negative.   PHYSICAL EXAM: VS:  BP 106/64   Pulse 82   Ht 5\' 5"  (1.651 m)   Wt 212 lb (96.2 kg)   BMI 35.28 kg/m  , BMI Body mass index is 35.28 kg/m. GEN: Well nourished, well developed, in no acute distress  HEENT: normal  Neck: no JVD, no masses. No carotid bruits Cardiac: RRR without murmur or gallop                Respiratory:  clear to auscultation bilaterally, normal work of breathing GI: soft, nontender, nondistended, + BS MS: no deformity or atrophy  Ext: no pretibial edema, pedal pulses 2+= bilaterally Skin: warm and dry, no rash Neuro:  Strength and sensation are intact Psych: euthymic mood, full affect  EKG:  EKG is not ordered today.  Recent Labs: 01/22/2016: Magnesium 1.6 03/23/2016: BUN 7; Creatinine, Ser 0.89; Hemoglobin 12.8; Platelets 328.0; Potassium 3.7; Sodium 137; TSH 0.89 04/12/2016: ALT 14   Lipid Panel     Component Value Date/Time   CHOL 168 04/12/2016 0832   TRIG 117 04/12/2016 0832   TRIG 102 01/02/2006 0825   HDL 36 (L) 04/12/2016 0832   CHOLHDL 4.7 (H) 04/12/2016 0832   CHOLHDL 5 03/23/2016 1528   VLDL 18.2 03/23/2016 1528   LDLCALC 109 (H) 04/12/2016 0832   LDLDIRECT 131.0 03/27/2015 1137      Wt Readings from Last 3 Encounters:  08/19/16 212 lb (96.2 kg)  07/05/16 209 lb (94.8 kg)  06/28/16 210 lb (95.3 kg)     Cardiac Studies Reviewed: 2D Echo 10-29-2015: Left ventricle:  The cavity size was normal. Wall thickness was normal. Systolic function was normal. The estimated ejection fraction was in the range of 55% to 60%. Wall motion was normal; there were no regional wall motion abnormalities. Doppler parameters are consistent with abnormal left ventricular relaxation (grade 1 diastolic dysfunction).  ------------------------------------------------------------------- Aortic valve:   Trileaflet; mildly calcified leaflets. Mobility was not restricted.  Doppler:  Transvalvular velocity was within  the normal range. There was no stenosis. There was no regurgitation.   ------------------------------------------------------------------- Aorta:  Aortic root: The aortic root was normal in size.  ------------------------------------------------------------------- Mitral valve:   Calcified annulus. Mobility was not restricted. Doppler:  Transvalvular velocity was within the normal  range. There was no evidence for stenosis. There was no regurgitation.    Peak gradient (D): 2 mm Hg.  ------------------------------------------------------------------- Left atrium:  The atrium was normal in size.  ------------------------------------------------------------------- Right ventricle:  The cavity size was normal. Systolic function was normal.  ------------------------------------------------------------------- Pulmonic valve:    Doppler:  Transvalvular velocity was within the normal range. There was no evidence for stenosis.  ------------------------------------------------------------------- Tricuspid valve:   Structurally normal valve.    Doppler: Transvalvular velocity was within the normal range. There was no regurgitation.  ------------------------------------------------------------------- Right atrium:  The atrium was normal in size.  ------------------------------------------------------------------- Pericardium:  There was no pericardial effusion.  ------------------------------------------------------------------- Systemic veins: Inferior vena cava: The vessel was normal in size.  ------------------------------------------------------------------- Measurements   Left ventricle                           Value        Reference  LV ID, ED, PLAX chordal                  43    mm     43 - 52  LV ID, ES, PLAX chordal                  32.4  mm     23 - 38  LV fx shortening, PLAX chordal   (L)     25    %      >=29  LV PW thickness, ED                      9.43  mm      ---------  IVS/LV PW ratio, ED                      0.89         <=1.3  Stroke volume, 2D                        66    ml     ---------  Stroke volume/bsa, 2D                    30    ml/m^2 ---------  LV ejection fraction, 1-p A4C            51    %      ---------  LV end-diastolic volume, 2-p             68    ml     ---------  LV end-systolic volume, 2-p              32    ml     ---------  LV ejection fraction, 2-p                53    %      ---------  Stroke volume, 2-p                       36    ml     ---------  LV end-diastolic volume/bsa, 2-p         31    ml/m^2 ---------  LV end-systolic volume/bsa, 2-p          15    ml/m^2 ---------  Stroke volume/bsa, 2-p  16.4  ml/m^2 ---------  LV e&', lateral                           7.02  cm/s   ---------  LV E/e&', lateral                         10.47        ---------  LV e&', medial                            5.26  cm/s   ---------  LV E/e&', medial                          13.97        ---------  LV e&', average                           6.14  cm/s   ---------  LV E/e&', average                         11.97        ---------    Ventricular septum                       Value        Reference  IVS thickness, ED                        8.36  mm     ---------    LVOT                                     Value        Reference  LVOT ID, S                               21    mm     ---------  LVOT area                                3.46  cm^2   ---------  LVOT peak velocity, S                    94    cm/s   ---------  LVOT mean velocity, S                    63.3  cm/s   ---------  LVOT VTI, S                              19.1  cm     ---------  LVOT peak gradient, S                    4     mm Hg  ---------    Aorta  Value        Reference  Aortic root ID, ED                       34    mm     ---------    Left atrium                              Value        Reference  LA ID,  A-P, ES                           22    mm     ---------  LA ID/bsa, A-P                           1     cm/m^2 <=2.2  LA volume, S                             59    ml     ---------  LA volume/bsa, S                         26.8  ml/m^2 ---------  LA volume, ES, 1-p A4C                   54    ml     ---------  LA volume/bsa, ES, 1-p A4C               24.5  ml/m^2 ---------  LA volume, ES, 1-p A2C                   62    ml     ---------  LA volume/bsa, ES, 1-p A2C               28.2  ml/m^2 ---------    Mitral valve                             Value        Reference  Mitral E-wave peak velocity              73.5  cm/s   ---------  Mitral A-wave peak velocity              78.5  cm/s   ---------  Mitral deceleration time         (H)     306   ms     150 - 230  Mitral peak gradient, D                  2     mm Hg  ---------  Mitral E/A ratio, peak                   0.9          ---------    Systemic veins                           Value        Reference  Estimated CVP  3     mm Hg  ---------    Right ventricle                          Value        Reference  RV s&', lateral, S                        11.6  cm/s   ---------  Cardiac Cath 10-29-2015: Conclusion   Conclusions: 1. Multivessel coronary artery disease, including 60% ostial and 70% proximal/mid RCA disease that is hemodynamically significant by FFR (0.71).  Mild in-stent restenosis of LAD stents, as well as 60% stenosis in the mid LCx adjacent to previously stented large OM are also present. 2. Successful FFR-guided PCI of the RCA from the ostium to the midvessel with three overlapping Promus Premier stents post-dilated to 3.25 mm with 0% residual stenosis and TIMI-3 flow. 3. Patient had significant pain chest pain beginning with FFR and continuing throughout intervention despite aggressive sedation and analgesia.  No clear angiographic or electrocardiographic etiology was identified.  Pain had returned  to pre-cath level by the end of the procedure.  Recommendations: 1. Transfer to 2-[facility] for aggressive medical management of chest pain, including weaning of NTG infusion as tolerated. 2. Dual antiplatelet therapy with aspirin and ticagrelor for at least 12 months, ideally longer. 3. Aggressive secondary prevention. 4. If patient continues to have chest pain, consider repeat catheterization with FFR and/or PCI to mid LCx. 5. Due to marked tortuosity of the innominate/right subclavian artery, consider alternate vascular access for further cardiac catheterizations.  Nelva Bush, MD [facility] Pager: [phone]    ASSESSMENT AND PLAN: 1.  Coronary artery disease, native vessel, with angina: The patient has atypical chest pain that I do not think his related to her CAD. She seems to be fairly well controlled on aspirin, clopidogrel, and a beta blocker. Her blood pressure is very well controlled. I did not recommend any changes today. She has low blood pressure and I don't think she would tolerate the addition of an ACE or ARB at this time. I will see her back in 6 months. Her most recent cardiac catheterization study is reviewed.  2. Hyperlipidemia: LDL cholesterol is above goal. She is not taking Crestor because of side effects. We'll try her on low-dose atorvastatin with repeat lipids and LFTs in 3 months.  3. Abdominal aortic aneurysm: Status post EVAR. Followed by vascular surgery. Understands the length with tobacco use and cessation counseling is done today.  Current medicines are reviewed with the patient today.  The patient does not have concerns regarding medicines.  Labs/ tests ordered today include:   Orders Placed This Encounter  Procedures  . Lipid panel  . Hepatic function panel    Disposition:   FU 6 months  Signed, Sherren Mocha, MD  08/19/2016 1:35 PM    [facility] [address], [place], [place]  [address] Phone: [phone];  Fax: [phone]

## 2016-08-30 ENCOUNTER — Ambulatory Visit: Payer: Medicare Other | Admitting: Vascular Surgery

## 2016-08-30 ENCOUNTER — Other Ambulatory Visit (HOSPITAL_COMMUNITY): Payer: Medicare Other

## 2016-09-23 ENCOUNTER — Encounter: Payer: Self-pay | Admitting: Internal Medicine

## 2016-09-23 ENCOUNTER — Ambulatory Visit (INDEPENDENT_AMBULATORY_CARE_PROVIDER_SITE_OTHER): Payer: Medicare Other | Admitting: Internal Medicine

## 2016-09-23 VITALS — BP 104/62 | HR 73 | Temp 98.7°F | Ht 65.0 in | Wt 211.0 lb

## 2016-09-23 DIAGNOSIS — Z23 Encounter for immunization: Secondary | ICD-10-CM

## 2016-09-23 DIAGNOSIS — M545 Low back pain, unspecified: Secondary | ICD-10-CM

## 2016-09-23 DIAGNOSIS — E11311 Type 2 diabetes mellitus with unspecified diabetic retinopathy with macular edema: Secondary | ICD-10-CM | POA: Diagnosis not present

## 2016-09-23 DIAGNOSIS — J309 Allergic rhinitis, unspecified: Secondary | ICD-10-CM

## 2016-09-23 DIAGNOSIS — G8929 Other chronic pain: Secondary | ICD-10-CM | POA: Diagnosis not present

## 2016-09-23 MED ORDER — TIZANIDINE HCL 4 MG PO TABS: 4.0000 mg | ORAL_TABLET | Freq: Four times a day (QID) | ORAL | 0 refills | Status: DC | PRN

## 2016-09-23 MED ORDER — PREDNISONE 10 MG PO TABS: ORAL_TABLET | ORAL | 0 refills | Status: DC

## 2016-09-23 MED ORDER — KETOROLAC TROMETHAMINE 30 MG/ML IJ SOLN
30.0000 mg | Freq: Once | INTRAMUSCULAR | Status: AC
  Administered 2016-09-23: 30 mg via INTRAMUSCULAR

## 2016-09-23 MED ORDER — METHYLPREDNISOLONE ACETATE 80 MG/ML IJ SUSP
80.0000 mg | Freq: Once | INTRAMUSCULAR | Status: AC
  Administered 2016-09-23: 80 mg via INTRAMUSCULAR

## 2016-09-23 MED ORDER — OXYCODONE HCL 5 MG PO TABS: 5.0000 mg | ORAL_TABLET | Freq: Four times a day (QID) | ORAL | 0 refills | Status: DC | PRN

## 2016-09-23 NOTE — Progress Notes (Signed)
Subjective:    Patient ID: [name], female    DOB: 1945/05/08, 71 y.o.   MRN: [id number]  HPI  Here to f/u, c/o acute on chronic recurring LBP x 2wks, out of oxycodone with last rx mar 2018 for #30 oinly; located across the lower back but Pt denies bowel or bladder change, fever, wt loss,  worsening LE pain/numbness/weakness, gait change or falls. Pt is S/p lumbar surgury 2016, pain now even worse.  No fever, trauma.  Denies urinary symptoms such as dysuria, urgency, flank pain, hematuria or n/v, fever, chills.  Denies worsening reflux, abd pain, dysphagia, n/v, bowel change or blood.  Has urinary freq but also drinks quite a bit of fluid, not really worse recently  Also with c/o ST after several wks ongoing nasal allergy symptoms with clearish congestion, itch and sneezing, without fever, pain, ST, cough, swelling or wheezing.   Pt denies polydipsia, polyuria Past Medical History:  Diagnosis Date  . AAA (abdominal aortic aneurysm) ([place])    a. 4.7cm in 08/2012. Followed by [facility].  . Allergic rhinitis, cause unspecified   . Anxiety state, unspecified   . Chronic LBP   . Coronary artery disease    a. s/p inferior MI 1998->PCI of RCA. b. S/p prior PCI of the OM1 and LAD;  c. 05/2013 Cath/PCI: LM nl, LAD 90p (3.0x22 Resolute DES), 7m w/ patent stent, Diag 40p, 78m, LCX patent OM1 stent, RCA 50p, 76m, EF 65%. d. 10/12 PCI with DESx3 to RCA  . Coronary artery disease involving native heart without angina pectoris 07/31/2006   Qualifier: Diagnosis of  By: [name] MD, [name]  ANGIOGRAPHIC DATA:  1. Ventriculography done in the RAO projection reveals a small wall  motion abnormality in the mid inferior wall. Ejection fraction  would be estimated at around 50%.  2. The right coronary artery has some progressive disease of about 60-  80% in the proximal mid segment. The distal vessel appears to be  50-70% narrowing although may be worse after nitroglycerin was  administered.  3. The left main is free of  critical disease.  4. The LAD is widely patent at the stent site with less than 30%  narrowing. There is some diffuse luminal irregularity. The  diagonal branch is somewhat diffusely diseased with about 70%  eccentric focal stenosis proximally.  5. The circumflex has about 30% narrowing. There was an AV circumflex  with about 70% narrowing supplying a distal marginal branch. The  large marginal branch has been previously stented. The stent  itself is patent but is totally occluded at the end of the stent.  The vess  . Degeneration of lumbar or lumbosacral intervertebral disc   . Depressive disorder, not elsewhere classified   . Diabetes mellitus    TYPE II  . Gout 08/22/2013  . Hyperlipidemia   . Hyperlipidemia 07/31/2006   Qualifier: Diagnosis of  By: [name] MD, [name]   . Hypertension   . Hypothyroidism   . Lower GI bleed 06/2010   Diverticular bleed  . Noncompliance with medications 02/26/2014  . Obesity, unspecified    Past Surgical History:  Procedure Laterality Date  . CARDIAC CATHETERIZATION     PCI OF BOTH THE CIRCUMFLEX AND LEFT ANTERIOR DESCENDING ARTERY  . CARDIAC CATHETERIZATION N/A 10/29/2015   Procedure: Coronary Stent Intervention;  Surgeon: Nelva Bush, MD;  Location: [place] [facility];  Service: Cardiovascular;  Laterality: N/A;  . CARDIAC CATHETERIZATION N/A 10/29/2015   Procedure: Coronary/Graft Angiography;  Surgeon: Nelva Bush, MD;  Location: [place] [facility];  Service: Cardiovascular;  Laterality: N/A;  . CARDIAC CATHETERIZATION N/A 10/29/2015   Procedure: Intravascular Pressure Wire/FFR Study;  Surgeon: Nelva Bush, MD;  Location: [place] [facility];  Service: Cardiovascular;  Laterality: N/A;  . CESAREAN SECTION    . ENDOVASCULAR STENT INSERTION N/A 01/22/2016   Procedure: ABDOMINAL AORTIC ENDOVASCULAR STENT GRAFT INSERTION;  Surgeon: Rosetta Posner, MD;  Location: [facility];  Service: Vascular;  Laterality: N/A;  . HEART STENT  04-2010  and  Jun 07, 2013    X 3  . LEFT HEART CATHETERIZATION WITH CORONARY ANGIOGRAM N/A 06/07/2013   Procedure: LEFT HEART CATHETERIZATION WITH CORONARY ANGIOGRAM;  Surgeon: [name], MD;  Location: [facility];  Service: Cardiovascular;  Laterality: N/A;  . LEFT HEART CATHETERIZATION WITH CORONARY ANGIOGRAM N/A 02/25/2014   Procedure: LEFT HEART CATHETERIZATION WITH CORONARY ANGIOGRAM;  Surgeon: Troy Sine, MD;  Location: [facility];  Service: Cardiovascular;  Laterality: N/A;  . LUMBAR FUSION  01/2007   DR. Patrice Paradise...3-LEVEL WITH FIXATION  . OOPHORECTOMY     BSO? pt.unsure  . PARATHYROIDECTOMY    . SPINE SURGERY    . THYROIDECTOMY    . TOTAL ABDOMINAL HYSTERECTOMY      reports that she quit smoking about 30 years ago. Her smoking use included Cigarettes. She quit after 30.00 years of use. She has never used smokeless tobacco. She reports that she does not drink alcohol or use drugs. family history includes Arthritis in her maternal aunt; Breast cancer in her maternal aunt; Diabetes in her brother and maternal aunt; Heart attack (age of onset: 84) in her mother; Heart attack (age of onset: 78) in her father; Heart disease in her father and mother. Allergies  Allergen Reactions  . Prilosec [Omeprazole] Other (See Comments)    Chest pain  . Miconazole Nitrate Hives    REACTION: hives  . Augmentin [Amoxicillin-Pot Clavulanate] Hives, Itching and Rash    Has patient had a PCN reaction causing immediate rash, facial/tongue/throat swelling, SOB or lightheadedness with hypotension:unsure Has patient had a PCN reaction causing severe rash involving mucus membranes or skin necrosis:unsure Has patient had a PCN reaction that required hospitalization:No Has patient had a PCN reaction occurring within the last 10 years:NO If all of the above answers are "NO", then may proceed with Cephalosporin use. Has patient had a PCN reaction causing immediate rash, facial/tongue/throat swelling, SOB or lightheadedness with  hypotension:unsure Has patient had a PCN reaction causing severe rash involving mucus membranes or skin necrosis:unsure Has patient had a PCN reaction that required hospitalization:No Has patient had a PCN reaction occurring within the last 10 years:NO If all of the above answers are "NO", then may proceed with Cephalosporin use.   [name] Doxycycline Other (See Comments)    REACTION: gi upset   Current Outpatient Prescriptions on File Prior to Visit  Medication Sig Dispense Refill  . allopurinol (ZYLOPRIM) 300 MG tablet Take 1 tablet (300 mg total) by mouth daily. 90 tablet 3  . aspirin 81 MG EC tablet Take 81 mg by mouth daily.     [name] atenolol (TENORMIN) 25 MG tablet TAKE 1 TABLET(25 MG) BY MOUTH TWICE DAILY 180 tablet 3  . atorvastatin (LIPITOR) 10 MG tablet Take 1 tablet (10 mg total) by mouth daily. 90 tablet 3  . Carboxymethylcellul-Glycerin ([employer] LUBRICATING/DRY EYE OP) Place 1-2 drops into both eyes 3 (three) times daily as needed (for dry eyes.).    [name]  clopidogrel (PLAVIX) 75 MG tablet Take 1 tablet (75 mg total) by mouth every evening.    . colchicine 0.6 MG tablet Take 0.6 mg by mouth 3 (three) times daily as needed (for gout flare up.).     [name] famotidine (PEPCID) 20 MG tablet Take 1 tablet (20 mg total) by mouth daily as needed (for acid reflux.).    [name] fluconazole (DIFLUCAN) 150 MG tablet Take 150 mg by mouth as directed.    . gabapentin (NEURONTIN) 300 MG capsule Take 1-2 capsules (300-600 mg total) by mouth daily as needed (for pain/neuropathy). 1-2 tab by mouth at bedtime    . metFORMIN (GLUCOPHAGE-XR) 500 MG 24 hr tablet TAKE 4 TABLETS BY MOUTH EVERY MORNING 360 tablet 0  . nitroGLYCERIN (NITROSTAT) 0.4 MG SL tablet Place 1 tablet (0.4 mg total) under the tongue every 5 (five) minutes as needed for chest pain. 25 tablet 3  . SYNTHROID 150 MCG tablet Take 1 tablet (150 mcg total) by mouth daily. 90 tablet 2  . traZODone (DESYREL) 50 MG tablet Take 0.5-1 tablets (25-50 mg total) by mouth  at bedtime as needed for sleep. 90 tablet 1  . Vitamin D, Ergocalciferol, (DRISDOL) 50000 units CAPS capsule Take 1 capsule (50,000 Units total) by mouth every 7 (seven) days. 12 capsule 0   No current facility-administered medications on file prior to visit.    Review of Systems  Constitutional: Negative for other unusual diaphoresis or sweats HENT: Negative for ear discharge or swelling Eyes: Negative for other worsening visual disturbances Respiratory: Negative for stridor or other swelling  Gastrointestinal: Negative for worsening distension or other blood Genitourinary: Negative for retention or other urinary change Musculoskeletal: Negative for other MSK pain or swelling Skin: Negative for color change or other new lesions Neurological: Negative for worsening tremors and other numbness  Psychiatric/Behavioral: Negative for worsening agitation or other fatigue All other system neg pe rpt    Objective:   Physical Exam BP 104/62   Pulse 73   Temp 98.7 F (37.1 C) (Oral)   Ht 5\' 5"  (1.651 m)   Wt 211 lb (95.7 kg)   SpO2 97%   BMI 35.11 kg/m  VS noted, morbid obese, not ill appaering but in pain Constitutional: Pt appears in NAD HENT: Head: NCAT.  Right Ear: External ear normal.  Left Ear: External ear normal.  Eyes: . Pupils are equal, round, and reactive to light. Conjunctivae and EOM are normal Nose: without d/c or deformity Bilat tm's with mild erythema.  Max sinus areas non tender.  Pharynx with mild erythema, no exudate Neck: Neck supple. Gross normal ROM Cardiovascular: Normal rate and regular rhythm.   Pulmonary/Chest: Effort normal and breath sounds without rales or wheezing.  Abd:  Soft, NT, ND, + BS, no organomegaly Spine: diffuse mod tender in midline lumbar and paravertebral Neurological: Pt is alert. At baseline orientation, motor grossly intact Skin: Skin is warm. No rashes, other new lesions, no LE edema Psychiatric: Pt behavior is normal without agitation   No other exam findings    Assessment & Plan:

## 2016-09-23 NOTE — Patient Instructions (Addendum)
You had the flu shot today, as well as the pain shot (toradol), and steroid shot  Please take all new medication as prescribed - the muscle relaxer, and prednisone  Please continue all other medications as before, and refills have been done if requested - the oxycodone  Please have the pharmacy call with any other refills you may need.  Please keep your appointments with your specialists as you may have planned

## 2016-09-24 NOTE — Assessment & Plan Note (Signed)
stable overall by history and exam, recent data reviewed with pt, and pt to continue medical treatment as before,  to f/u any worsening symptoms or concerns Lab Results  Component Value Date   HGBA1C 6.4 03/23/2016  pt to call for onset polys or cbg > 200 with tx

## 2016-09-24 NOTE — Assessment & Plan Note (Addendum)
With acute flare, no change in neuro status, for oxycodone refill for prn occasional use only, muscle relaxer prn, MRI Ls Spine, toradol 30 mg IM now, depomedrol 80 mg, consider ortho referral depending on MRi results

## 2016-09-24 NOTE — Assessment & Plan Note (Signed)
/  Mild to mod, for predpac asd,  to f/u any worsening symptoms or concerns 

## 2016-09-27 ENCOUNTER — Ambulatory Visit: Payer: Medicare Other | Admitting: Internal Medicine

## 2016-10-08 ENCOUNTER — Ambulatory Visit
Admission: RE | Admit: 2016-10-08 | Discharge: 2016-10-08 | Disposition: A | Discharge status: Home / Self Care | Payer: Medicare Other | Source: Ambulatory Visit | Attending: Internal Medicine | Admitting: Internal Medicine

## 2016-10-08 DIAGNOSIS — M545 Low back pain, unspecified: Secondary | ICD-10-CM

## 2016-10-10 ENCOUNTER — Telehealth: Payer: Self-pay

## 2016-10-10 NOTE — Telephone Encounter (Signed)
-----   Message from Biagio Borg, MD sent at 10/10/2016  2:56 PM EDT ----- Left message on MyChart, pt to cont same tx except  The test results show that your current treatment is OK, except the spinal stenosis is worsening.  I will ask the office to call to see if you need a referral with any preference for orthopedic.[name]    Shirron to please inform pt, and let me know if she needs referral to orthopedic of her choice

## 2016-10-10 NOTE — Telephone Encounter (Signed)
Called pt, she has been informed and expressed understanding. She stated that she does not have a preference.

## 2016-10-11 ENCOUNTER — Ambulatory Visit (HOSPITAL_COMMUNITY)
Admission: RE | Admit: 2016-10-11 | Discharge: 2016-10-11 | Disposition: A | Discharge status: Home / Self Care | Payer: Medicare Other | Source: Ambulatory Visit | Attending: Vascular Surgery | Admitting: Vascular Surgery

## 2016-10-11 ENCOUNTER — Encounter: Payer: Self-pay | Admitting: Vascular Surgery

## 2016-10-11 ENCOUNTER — Ambulatory Visit (INDEPENDENT_AMBULATORY_CARE_PROVIDER_SITE_OTHER): Payer: Medicare Other | Admitting: Vascular Surgery

## 2016-10-11 VITALS — BP 108/60 | HR 65 | Ht 65.0 in | Wt 213.1 lb

## 2016-10-11 DIAGNOSIS — I714 Abdominal aortic aneurysm, without rupture, unspecified: Secondary | ICD-10-CM

## 2016-10-11 NOTE — Progress Notes (Signed)
[facility]  Patient name: [name] MRN: [id number] DOB: 1945-06-23 Sex: female  REASON FOR VISIT: Follow-up stent graft repair of abdominal aortic aneurysm  HPI: [name] is a 71 y.o. female here for follow-up. She underwent stent graft repair for expanding infrarenal abdominal aortic aneurysm on 01/22/2016. A stat CT scan one month later revealed maximal diameter of 5.4 cm with no evidence of endoleak. She is here today for continued follow-up. She's had no difficulty in the last 6 months. No cardiac difficulty and no symptoms referable to her aneurysm.  Past Medical History:  Diagnosis Date  . AAA (abdominal aortic aneurysm) ([place])    a. 4.7cm in 08/2012. Followed by [facility].  . Allergic rhinitis, cause unspecified   . Anxiety state, unspecified   . Chronic LBP   . Coronary artery disease    a. s/p inferior MI 1998->PCI of RCA. b. S/p prior PCI of the OM1 and LAD;  c. 05/2013 Cath/PCI: LM nl, LAD 90p (3.0x22 Resolute DES), 27m w/ patent stent, Diag 40p, 23m, LCX patent OM1 stent, RCA 50p, 19m, EF 65%. d. 10/12 PCI with DESx3 to RCA  . Coronary artery disease involving native heart without angina pectoris 07/31/2006   Qualifier: Diagnosis of  By: [name] MD, [name]  ANGIOGRAPHIC DATA:  1. Ventriculography done in the RAO projection reveals a small wall  motion abnormality in the mid inferior wall. Ejection fraction  would be estimated at around 50%.  2. The right coronary artery has some progressive disease of about 60-  80% in the proximal mid segment. The distal vessel appears to be  50-70% narrowing although may be worse after nitroglycerin was  administered.  3. The left main is free of critical disease.  4. The LAD is widely patent at the stent site with less than 30%  narrowing. There is some diffuse luminal irregularity. The  diagonal branch is somewhat diffusely diseased with about 70%  eccentric focal stenosis  proximally.  5. The circumflex has about 30% narrowing. There was an AV circumflex  with about 70% narrowing supplying a distal marginal branch. The  large marginal branch has been previously stented. The stent  itself is patent but is totally occluded at the end of the stent.  The vess  . Degeneration of lumbar or lumbosacral intervertebral disc   . Depressive disorder, not elsewhere classified   . Diabetes mellitus    TYPE II  . Gout 08/22/2013  . Hyperlipidemia   . Hyperlipidemia 07/31/2006   Qualifier: Diagnosis of  By: [name] MD, [name]   . Hypertension   . Hypothyroidism   . Lower GI bleed 06/2010   Diverticular bleed  . Noncompliance with medications 02/26/2014  . Obesity, unspecified     Family History  Problem Relation Age of Onset  . Heart attack Mother 9       s/p D&C-CARDIAC ARREST 1966  . Heart disease Mother   . Heart attack Father 75       1978 WITH MI  . Heart disease Father   . Diabetes Brother   . Diabetes Maternal Aunt   . Arthritis Maternal Aunt   . Breast cancer Maternal Aunt        Post menopausal  . Colon cancer Neg Hx     SOCIAL HISTORY: Social History  Substance Use Topics  . Smoking status: Former Smoker    Years: 30.00    Types: Cigarettes    Quit  date: 05/31/1986  . Smokeless tobacco: Never Used  . Alcohol use No    Allergies  Allergen Reactions  . Prilosec [Omeprazole] Other (See Comments)    Chest pain  . Miconazole Nitrate Hives    REACTION: hives  . Augmentin [Amoxicillin-Pot Clavulanate] Hives, Itching and Rash    Has patient had a PCN reaction causing immediate rash, facial/tongue/throat swelling, SOB or lightheadedness with hypotension:unsure Has patient had a PCN reaction causing severe rash involving mucus membranes or skin necrosis:unsure Has patient had a PCN reaction that required hospitalization:No Has patient had a PCN reaction occurring within the last 10 years:NO If all of the above answers are "NO", then may proceed  with Cephalosporin use. Has patient had a PCN reaction causing immediate rash, facial/tongue/throat swelling, SOB or lightheadedness with hypotension:unsure Has patient had a PCN reaction causing severe rash involving mucus membranes or skin necrosis:unsure Has patient had a PCN reaction that required hospitalization:No Has patient had a PCN reaction occurring within the last 10 years:NO If all of the above answers are "NO", then may proceed with Cephalosporin use.   [name] Doxycycline Other (See Comments)    REACTION: gi upset    Current Outpatient Prescriptions  Medication Sig Dispense Refill  . allopurinol (ZYLOPRIM) 300 MG tablet Take 1 tablet (300 mg total) by mouth daily. 90 tablet 3  . aspirin 81 MG EC tablet Take 81 mg by mouth daily.     [name] atenolol (TENORMIN) 25 MG tablet TAKE 1 TABLET(25 MG) BY MOUTH TWICE DAILY 180 tablet 3  . atorvastatin (LIPITOR) 10 MG tablet Take 1 tablet (10 mg total) by mouth daily. 90 tablet 3  . Carboxymethylcellul-Glycerin ([employer] LUBRICATING/DRY EYE OP) Place 1-2 drops into both eyes 3 (three) times daily as needed (for dry eyes.).    [name] clopidogrel (PLAVIX) 75 MG tablet Take 1 tablet (75 mg total) by mouth every evening.    . colchicine 0.6 MG tablet Take 0.6 mg by mouth 3 (three) times daily as needed (for gout flare up.).     [name] famotidine (PEPCID) 20 MG tablet Take 1 tablet (20 mg total) by mouth daily as needed (for acid reflux.).    [name] fluconazole (DIFLUCAN) 150 MG tablet Take 150 mg by mouth as directed.    . gabapentin (NEURONTIN) 300 MG capsule Take 1-2 capsules (300-600 mg total) by mouth daily as needed (for pain/neuropathy). 1-2 tab by mouth at bedtime    . metFORMIN (GLUCOPHAGE-XR) 500 MG 24 hr tablet TAKE 4 TABLETS BY MOUTH EVERY MORNING 360 tablet 0  . nitroGLYCERIN (NITROSTAT) 0.4 MG SL tablet Place 1 tablet (0.4 mg total) under the tongue every 5 (five) minutes as needed for chest pain. 25 tablet 3  . oxyCODONE (ROXICODONE) 5 MG immediate release  tablet Take 1 tablet (5 mg total) by mouth every 6 (six) hours as needed for severe pain. 30 tablet 0  . predniSONE (DELTASONE) 10 MG tablet 3 tabs by mouth per day for 3 days,2tabs per day for 3 days,1tab per day for 3 days 18 tablet 0  . SYNTHROID 150 MCG tablet Take 1 tablet (150 mcg total) by mouth daily. 90 tablet 2  . tiZANidine (ZANAFLEX) 4 MG tablet Take 1 tablet (4 mg total) by mouth every 6 (six) hours as needed for muscle spasms. 30 tablet 0  . traZODone (DESYREL) 50 MG tablet Take 0.5-1 tablets (25-50 mg total) by mouth at bedtime as needed for sleep. 90 tablet 1  . Vitamin D, Ergocalciferol, (DRISDOL) 50000  units CAPS capsule Take 1 capsule (50,000 Units total) by mouth every 7 (seven) days. 12 capsule 0   No current facility-administered medications for this visit.     REVIEW OF SYSTEMS:  [X]  denotes positive finding, [ ]  denotes negative finding Cardiac  Comments:  Chest pain or chest pressure:    Shortness of breath upon exertion:    Short of breath when lying flat:    Irregular heart rhythm:        Vascular    Pain in calf, thigh, or hip brought on by ambulation:    Pain in feet at night that wakes you up from your sleep:     Blood clot in your veins:    Leg swelling:           PHYSICAL EXAM: Vitals:   10/11/16 0843  BP: 108/60  Pulse: 65  SpO2: 99%  Weight: 213 lb 1.6 oz (96.7 kg)  Height: 5\' 5"  (1.651 m)    GENERAL: The patient is a well-nourished female, in no acute distress. The vital signs are documented above.  CARDIOVASCULAR: 2+ radial al pulses. Heart regular rate and rhythm without murmur. PULMONARY: There is good air exchange  MUSCULOSKELETAL: There are no major deformities or cyanosis. NEUROLOGIC: No focal weakness or paresthesias are detected. SKIN: There are no ulcers or rashes noted. PSYCHIATRIC: The patient has a normal affect.  DATA:  Ultrasound today reveals maximal diameter of 4.4 cm of her infrarenal aorta  MEDICAL ISSUES: Stable  status post stent graft repair abdominal aortic aneurysm January 2018. She has done well and has had no complications. She will continue her full activities. She will see our nurse practitioner in one year for continued follow-up. We would repeat this yearly for 2-3 years and then drop back to every other year if this remains stable.    Rosetta Posner, MD FACS [facility] Tel [phone] Pager [phone]

## 2016-10-14 NOTE — Addendum Note (Signed)
Addended by: Lianne Cure A on: 10/14/2016 04:55 PM   Modules accepted: Orders

## 2016-11-09 ENCOUNTER — Ambulatory Visit (INDEPENDENT_AMBULATORY_CARE_PROVIDER_SITE_OTHER): Payer: Medicare Other | Admitting: Family Medicine

## 2016-11-09 ENCOUNTER — Encounter: Payer: Self-pay | Admitting: Family Medicine

## 2016-11-09 DIAGNOSIS — M545 Low back pain, unspecified: Secondary | ICD-10-CM

## 2016-11-09 DIAGNOSIS — G8929 Other chronic pain: Secondary | ICD-10-CM

## 2016-11-09 MED ORDER — BACLOFEN 10 MG PO TABS: 10.0000 mg | ORAL_TABLET | Freq: Two times a day (BID) | ORAL | 0 refills | Status: DC

## 2016-11-09 MED ORDER — TRAMADOL HCL 50 MG PO TABS: 50.0000 mg | ORAL_TABLET | Freq: Three times a day (TID) | ORAL | 0 refills | Status: DC | PRN

## 2016-11-09 NOTE — Patient Instructions (Signed)
Thank you for coming in,   If your pain continues then you may need to follow up with your surgeon.   Please try the exercises   Please try using heat and ice   Please take tylenol   Please try applying topical muscle creams as well.    Please feel free to call with any questions or concerns at any time, at [phone]. --Dr. Raeford Razor

## 2016-11-09 NOTE — Assessment & Plan Note (Signed)
Would need to make sure that this is not related to surgical components. If there is any loosening of hardware a bone scan or a CT scan  may be indicated at some point. If the pain continues then would advise her to follow-up with her surgeon.
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
## 2016-11-09 NOTE — Assessment & Plan Note (Signed)
The pain with which she presents today appears to be acute and nature. This appears to be more muscular than nerve. It could be however related to the surgical changes and having spasms within her back associated with the structural changes. MRI recently completed does show spinal stenosis but symptoms today would not reflect that is the source of her pain. - Tramadol for pain - Initiate baclofen and stop tizanidine - Counseled on home exercise therapy. - Could consider another course of physical therapy if no improvement.

## 2016-11-15 ENCOUNTER — Other Ambulatory Visit: Payer: Self-pay | Admitting: Internal Medicine

## 2016-11-15 ENCOUNTER — Ambulatory Visit (INDEPENDENT_AMBULATORY_CARE_PROVIDER_SITE_OTHER): Payer: Medicare Other | Admitting: Internal Medicine

## 2016-11-15 ENCOUNTER — Encounter: Payer: Self-pay | Admitting: Internal Medicine

## 2016-11-15 ENCOUNTER — Other Ambulatory Visit (INDEPENDENT_AMBULATORY_CARE_PROVIDER_SITE_OTHER): Payer: Medicare Other

## 2016-11-15 VITALS — BP 124/70 | HR 74 | Temp 98.0°F | Resp 16 | Wt 213.2 lb

## 2016-11-15 DIAGNOSIS — R350 Frequency of micturition: Secondary | ICD-10-CM

## 2016-11-15 DIAGNOSIS — E11311 Type 2 diabetes mellitus with unspecified diabetic retinopathy with macular edema: Secondary | ICD-10-CM

## 2016-11-15 DIAGNOSIS — M545 Low back pain, unspecified: Secondary | ICD-10-CM

## 2016-11-15 DIAGNOSIS — R109 Unspecified abdominal pain: Secondary | ICD-10-CM | POA: Diagnosis not present

## 2016-11-15 DIAGNOSIS — Z23 Encounter for immunization: Secondary | ICD-10-CM

## 2016-11-15 DIAGNOSIS — I10 Essential (primary) hypertension: Secondary | ICD-10-CM

## 2016-11-15 DIAGNOSIS — G8929 Other chronic pain: Secondary | ICD-10-CM

## 2016-11-15 LAB — BASIC METABOLIC PANEL
BUN: 10 mg/dL (ref 6–23)
CHLORIDE: 103 meq/L (ref 96–112)
CO2: 30 mEq/L (ref 19–32)
Calcium: 9.5 mg/dL (ref 8.4–10.5)
Creatinine, Ser: 0.84 mg/dL (ref 0.40–1.20)
GFR: 85.9 mL/min (ref >60.00)
Glucose, Bld: 126 mg/dL — ABNORMAL HIGH (ref 70–99)
POTASSIUM: 4 meq/L (ref 3.5–5.1)
Sodium: 141 mEq/L (ref 135–145)

## 2016-11-15 LAB — HEPATIC FUNCTION PANEL
ALK PHOS: 96 U/L (ref 39–117)
ALT: 13 U/L (ref 0–35)
AST: 14 U/L (ref 0–37)
Albumin: 3.9 g/dL (ref 3.5–5.2)
BILIRUBIN DIRECT: 0.2 mg/dL (ref 0.0–0.3)
BILIRUBIN TOTAL: 0.7 mg/dL (ref 0.2–1.2)
Total Protein: 7.8 g/dL (ref 6.0–8.3)

## 2016-11-15 LAB — URINALYSIS, ROUTINE W REFLEX MICROSCOPIC
BILIRUBIN URINE: NEGATIVE
Ketones, ur: NEGATIVE
Leukocytes, UA: NEGATIVE
NITRITE: POSITIVE — AB
Specific Gravity, Urine: 1.02 (ref 1.000–1.030)
TOTAL PROTEIN, URINE-UPE24: NEGATIVE
URINE GLUCOSE: NEGATIVE
Urobilinogen, UA: 1 (ref 0.0–1.0)
pH: 5.5 (ref 5.0–8.0)

## 2016-11-15 LAB — LIPID PANEL
CHOL/HDL RATIO: 5
Cholesterol: 167 mg/dL (ref 0–200)
HDL: 31.7 mg/dL — ABNORMAL LOW (ref >39.00)
LDL Cholesterol: 117 mg/dL — ABNORMAL HIGH (ref 0–99)
NONHDL: 135.49
TRIGLYCERIDES: 93 mg/dL (ref 0.0–149.0)
VLDL: 18.6 mg/dL (ref 0.0–40.0)

## 2016-11-15 LAB — HEMOGLOBIN A1C: Hgb A1c MFr Bld: 6.4 % (ref 4.6–6.5)

## 2016-11-15 MED ORDER — TIZANIDINE HCL 4 MG PO TABS: 4.0000 mg | ORAL_TABLET | Freq: Four times a day (QID) | ORAL | 1 refills | Status: DC | PRN

## 2016-11-15 MED ORDER — ATORVASTATIN CALCIUM 20 MG PO TABS: 20.0000 mg | ORAL_TABLET | Freq: Every day | ORAL | 3 refills | Status: DC

## 2016-11-15 MED ORDER — NITROFURANTOIN MACROCRYSTAL 50 MG PO CAPS: 50.0000 mg | ORAL_CAPSULE | Freq: Two times a day (BID) | ORAL | 0 refills | Status: DC

## 2016-11-15 MED ORDER — ZOSTER VAC RECOMB ADJUVANTED 50 MCG/0.5ML IM SUSR: 0.5000 mL | Freq: Once | INTRAMUSCULAR | 1 refills | Status: AC

## 2016-11-15 MED ORDER — KETOROLAC TROMETHAMINE 60 MG/2ML IM SOLN
60.0000 mg | Freq: Once | INTRAMUSCULAR | Status: AC
  Administered 2016-11-15: 60 mg via INTRAMUSCULAR

## 2016-11-15 NOTE — Assessment & Plan Note (Signed)
I agree with sport med this appears to have acute element related to muscle spasm; pt does not want to try the baclofen, ok for toradol 60 IM x 1, retry tizanidine prn, cont other pain meds as already rx, declines PT referral

## 2016-11-15 NOTE — Progress Notes (Signed)
Subjective:    Patient ID: [name], female    DOB: 01/01/1946, 71 y.o.   MRN: [id number]  HPI  Here with 3 days right lower back pain, has a sore area, sharp, moderate, intermittent, worse to stand up, better to lie down. Denies urinary symptoms such as dysuria, urgency, flank pain, hematuria or n/v, fever, chills, though has had some urinary frequency..  Pt denies bowel or bladder change, fever, wt loss,  worsening LE pain/numbness/weakness, gait change or falls.  Denies worsening reflux, abd pain, dysphagia, n/v, bowel change or blood.    BP Readings from Last 3 Encounters:  11/15/16 124/70  11/09/16 136/74  10/11/16 108/60   Wt Readings from Last 3 Encounters:  11/15/16 213 lb 4 oz (96.7 kg)  11/09/16 214 lb (97.1 kg)  10/11/16 213 lb 1.6 oz (96.7 kg)   Pt denies polydipsia, polyuria Past Medical History:  Diagnosis Date  . AAA (abdominal aortic aneurysm) ([place])    a. 4.7cm in 08/2012. Followed by [facility].  . Allergic rhinitis, cause unspecified   . Anxiety state, unspecified   . Chronic LBP   . Coronary artery disease    a. s/p inferior MI 1998->PCI of RCA. b. S/p prior PCI of the OM1 and LAD;  c. 05/2013 Cath/PCI: LM nl, LAD 90p (3.0x22 Resolute DES), 44m w/ patent stent, Diag 40p, 12m, LCX patent OM1 stent, RCA 50p, 67m, EF 65%. d. 10/12 PCI with DESx3 to RCA  . Coronary artery disease involving native heart without angina pectoris 07/31/2006   Qualifier: Diagnosis of  By: [name] MD, [name]  ANGIOGRAPHIC DATA:  1. Ventriculography done in the RAO projection reveals a small wall  motion abnormality in the mid inferior wall. Ejection fraction  would be estimated at around 50%.  2. The right coronary artery has some progressive disease of about 60-  80% in the proximal mid segment. The distal vessel appears to be  50-70% narrowing although may be worse after nitroglycerin was  administered.  3. The left main is free of critical disease.  4. The LAD is widely patent at the stent site  with less than 30%  narrowing. There is some diffuse luminal irregularity. The  diagonal branch is somewhat diffusely diseased with about 70%  eccentric focal stenosis proximally.  5. The circumflex has about 30% narrowing. There was an AV circumflex  with about 70% narrowing supplying a distal marginal branch. The  large marginal branch has been previously stented. The stent  itself is patent but is totally occluded at the end of the stent.  The vess  . Degeneration of lumbar or lumbosacral intervertebral disc   . Depressive disorder, not elsewhere classified   . Diabetes mellitus    TYPE II  . Gout 08/22/2013  . Hyperlipidemia   . Hyperlipidemia 07/31/2006   Qualifier: Diagnosis of  By: [name] MD, [name]   . Hypertension   . Hypothyroidism   . Lower GI bleed 06/2010   Diverticular bleed  . Noncompliance with medications 02/26/2014  . Obesity, unspecified    Past Surgical History:  Procedure Laterality Date  . CARDIAC CATHETERIZATION     PCI OF BOTH THE CIRCUMFLEX AND LEFT ANTERIOR DESCENDING ARTERY  . CARDIAC CATHETERIZATION N/A 10/29/2015   Procedure: Coronary Stent Intervention;  Surgeon: Nelva Bush, MD;  Location: [place] [facility];  Service: Cardiovascular;  Laterality: N/A;  . CARDIAC CATHETERIZATION N/A 10/29/2015   Procedure: Coronary/Graft Angiography;  Surgeon: Nelva Bush, MD;  Location: [facility]  INVASIVE CV LAB;  Service: Cardiovascular;  Laterality: N/A;  . CARDIAC CATHETERIZATION N/A 10/29/2015   Procedure: Intravascular Pressure Wire/FFR Study;  Surgeon: Nelva Bush, MD;  Location: [place] [facility];  Service: Cardiovascular;  Laterality: N/A;  . CESAREAN SECTION    . ENDOVASCULAR STENT INSERTION N/A 01/22/2016   Procedure: ABDOMINAL AORTIC ENDOVASCULAR STENT GRAFT INSERTION;  Surgeon: Rosetta Posner, MD;  Location: [place];  Service: Vascular;  Laterality: N/A;  . HEART STENT  04-2010  and  Jun 07, 2013   X 3  . LEFT HEART CATHETERIZATION WITH CORONARY ANGIOGRAM N/A  06/07/2013   Procedure: LEFT HEART CATHETERIZATION WITH CORONARY ANGIOGRAM;  Surgeon: [name], MD;  Location: [facility] CATH LAB;  Service: Cardiovascular;  Laterality: N/A;  . LEFT HEART CATHETERIZATION WITH CORONARY ANGIOGRAM N/A 02/25/2014   Procedure: LEFT HEART CATHETERIZATION WITH CORONARY ANGIOGRAM;  Surgeon: Troy Sine, MD;  Location: [facility] CATH LAB;  Service: Cardiovascular;  Laterality: N/A;  . LUMBAR FUSION  01/2007   DR. Patrice Paradise...3-LEVEL WITH FIXATION  . OOPHORECTOMY     BSO? pt.unsure  . PARATHYROIDECTOMY    . SPINE SURGERY    . THYROIDECTOMY    . TOTAL ABDOMINAL HYSTERECTOMY      reports that she quit smoking about 30 years ago. Her smoking use included Cigarettes. She quit after 30.00 years of use. She has never used smokeless tobacco. She reports that she does not drink alcohol or use drugs. family history includes Arthritis in her maternal aunt; Breast cancer in her maternal aunt; Diabetes in her brother and maternal aunt; Heart attack (age of onset: 53) in her mother; Heart attack (age of onset: 82) in her father; Heart disease in her father and mother.   Review of Systems  Constitutional: Negative for other unusual diaphoresis or sweats HENT: Negative for ear discharge or swelling Eyes: Negative for other worsening visual disturbances Respiratory: Negative for stridor or other swelling  Gastrointestinal: Negative for worsening distension or other blood Genitourinary: Negative for retention or other urinary change Musculoskeletal: Negative for other MSK pain or swelling Skin: Negative for color change or other new lesions Neurological: Negative for worsening tremors and other numbness  Psychiatric/Behavioral: Negative for worsening agitation or other fatigue All other system neg per pt    Objective:   Physical Exam BP 124/70 (BP Location: Left Arm, Patient Position: Sitting, Cuff Size: Normal)   Pulse 74   Temp 98 F (36.7 C) (Oral)   Resp 16   Wt 213 lb 4  oz (96.7 kg)   SpO2 95%   BMI 35.49 kg/m  VS noted,  Constitutional: Pt appears in NAD HENT: Head: NCAT.  Right Ear: External ear normal.  Left Ear: External ear normal.  Eyes: . Pupils are equal, round, and reactive to light. Conjunctivae and EOM are normal Nose: without d/c or deformity Neck: Neck supple. Gross normal ROM Cardiovascular: Normal rate and regular rhythm.   Pulmonary/Chest: Effort normal and breath sounds without rales or wheezing.  Abd:  Soft, NT, ND, + BS, no organomegaly Spine nontender in the midline + tender far right lateral paravertebral without rash or swelling Neurological: Pt is alert. At baseline orientation, motor grossly intact Skin: Skin is warm. No rashes, other new lesions, no LE edema Psychiatric: Pt behavior is normal without agitation  No other exam findings  Lab Results  Component Value Date   WBC 9.4 03/23/2016   HGB 12.8 03/23/2016   HCT 39.3 03/23/2016   PLT 328.0 03/23/2016  GLUCOSE 130 (H) 03/23/2016   CHOL 168 04/12/2016   TRIG 117 04/12/2016   HDL 36 (L) 04/12/2016   LDLDIRECT 131.0 03/27/2015   LDLCALC 109 (H) 04/12/2016   ALT 14 04/12/2016   AST 14 04/12/2016   NA 137 03/23/2016   K 3.7 03/23/2016   CL 101 03/23/2016   CREATININE 0.89 03/23/2016   BUN 7 03/23/2016   CO2 29 03/23/2016   TSH 0.89 03/23/2016   INR 1.08 01/22/2016   HGBA1C 6.4 03/23/2016   MICROALBUR 3.5 (H) 03/23/2016       Assessment & Plan:

## 2016-11-15 NOTE — Patient Instructions (Addendum)
Your shingles shot was sent to the pharmacy  You had the pain shot today (toradol 60 mg)  Please take all new medication as prescribed - the tizanidine  Please continue all other medications as before, and refills have been done if requested.  Please have the pharmacy call with any other refills you may need.  Please continue your efforts at being more active, low cholesterol diabetic diet, and weight control.  Please keep your appointments with your specialists as you may have planned  Please go to the LAB in the Basement (turn left off the elevator) for the tests to be done today  You will be contacted by phone if any changes need to be made immediately.  Otherwise, you will receive a letter about your results with an explanation, but please check with MyChart first.  Please remember to sign up for MyChart if you have not done so, as this will be important to you in the future with finding out test results, communicating by private email, and scheduling acute appointments online when needed.  If you have Medicare related insurance (such as traditional Medicare, Blue [employer] or [employer], or similar), Please make an appointment at the [facility] with Sharee Pimple, the [employer], for your Wellness Visit in this office, which is a benefit with your insurance.  Please return in 6 months, or sooner if needed, with Lab testing done 3-5 days before

## 2016-11-17 LAB — URINE CULTURE
MICRO NUMBER:: 81215119
SPECIMEN QUALITY: ADEQUATE

## 2016-11-18 ENCOUNTER — Ambulatory Visit: Payer: Medicare Other | Admitting: Internal Medicine

## 2016-11-18 NOTE — Assessment & Plan Note (Signed)
stable overall by history and exam, recent data reviewed with pt, and pt to continue medical treatment as before,  to f/u any worsening symptoms or concerns Lab Results  Component Value Date   HGBA1C 6.4 11/15/2016

## 2016-11-18 NOTE — Assessment & Plan Note (Signed)
stable overall by history and exam, recent data reviewed with pt, and pt to continue medical treatment as before,  to f/u any worsening symptoms or concerns BP Readings from Last 3 Encounters:  11/15/16 124/70  11/09/16 136/74  10/11/16 108/60

## 2016-11-18 NOTE — Assessment & Plan Note (Signed)
Etiology unclear, for urine studies, consider tx pending results

## 2016-11-30 ENCOUNTER — Other Ambulatory Visit: Payer: Medicare Other | Admitting: *Deleted

## 2016-11-30 DIAGNOSIS — I25119 Atherosclerotic heart disease of native coronary artery with unspecified angina pectoris: Secondary | ICD-10-CM

## 2016-11-30 DIAGNOSIS — E7800 Pure hypercholesterolemia, unspecified: Secondary | ICD-10-CM

## 2016-11-30 LAB — HEPATIC FUNCTION PANEL
ALT: 18 IU/L (ref 0–32)
AST: 21 IU/L (ref 0–40)
Albumin: 4 g/dL (ref 3.5–4.8)
Alkaline Phosphatase: 108 IU/L (ref 39–117)
Bilirubin Total: 0.5 mg/dL (ref 0.0–1.2)
Bilirubin, Direct: 0.15 mg/dL (ref 0.00–0.40)
Total Protein: 7.4 g/dL (ref 6.0–8.5)

## 2016-11-30 LAB — LIPID PANEL
CHOL/HDL RATIO: 6.2 ratio — AB (ref 0.0–4.4)
CHOLESTEROL TOTAL: 181 mg/dL (ref 100–199)
HDL: 29 mg/dL — AB (ref >39)
LDL CALC: 123 mg/dL — AB (ref 0–99)
TRIGLYCERIDES: 145 mg/dL (ref 0–149)
VLDL CHOLESTEROL CAL: 29 mg/dL (ref 5–40)

## 2016-12-01 ENCOUNTER — Telehealth: Payer: Self-pay | Admitting: Cardiovascular Disease

## 2016-12-01 ENCOUNTER — Telehealth: Payer: Self-pay

## 2016-12-01 DIAGNOSIS — E785 Hyperlipidemia, unspecified: Secondary | ICD-10-CM

## 2016-12-01 NOTE — Telephone Encounter (Signed)
Informed patient of results and verbal understanding expressed.  Atorvastatin was increased to 20 mg daily 2 weeks ago at Dr. Gwynn Burly OV, but the patient states she hasn't been taking it. She denies myalgias or symptoms, she just says she has enough medications to take and chose that one to stop. Reiterated to her the importance of taking her medication every day so her CAD does not progress. Scheduled patient for repeat fasting labs Feb. 20. Scheduled patient for follow-up with Dr. Burt Knack Feb. 55. She was grateful for call and agrees with treatment plan.

## 2016-12-01 NOTE — Telephone Encounter (Signed)
New Message     Patient was returning call for lab results

## 2016-12-01 NOTE — Telephone Encounter (Signed)
-----   Message from Sherren Mocha, MD sent at 11/30/2016  3:44 PM EST ----- LDL above goal. Would increase atorvastatin to 20 mg and repeat labs in 3 months. thx

## 2016-12-01 NOTE — Telephone Encounter (Signed)
   [facility] Pre-operative Risk Assessment    Request for surgical clearance:  1. What type of surgery is being performed? Revision T-10-S1 PSF, Laminectomy, TLIF, instrumentation, osteotomy, allograft   2. When is this surgery scheduled? ASAP   3. Are there any medications that need to be held prior to surgery and how long? Requesting Plavix instructions   4. Practice name and name of physician performing surgery? [facility]   5. What is your office phone and fax number?  1. [phone] (Joy) 2. [phone] attn: Clinic   6. Anesthesia type (None, local, MAC, general) ? General

## 2016-12-02 NOTE — Telephone Encounter (Signed)
Patient of Dr. Burt Knack     Chart reviewed as part of pre-operative protocol coverage. Patient was contacted 12/02/2016 in reference to pre-operative risk assessment for pending surgery as outlined below.  Dezerae JENA TEGELER was last seen on 08/19/2016 by Dr. Burt Knack.  Since that day, Jaleen ARTINA MINELLA has not done that well.  She has significant dyspnea on exertion.  She does not think she can walk 100 feet without stopping due to shortness of breath.  She cannot climb a flight of steps without stopping, and is only doing one step at a time because of her back.  She is struggling a great deal because of back pain as well.  Due to new or worsening symptoms, Chioma MARQUESA RATH will require a follow-up visit for further pre-operative risk assessment. We were able to work her in early next week with Melina Copa, PA C  I will contact Dr. Burt Knack to make sure she can be off her Plavix for the procedure.  Pre-op covering staff: - Please schedule appointment and call patient to inform them. - Please contact requesting surgeon's office via preferred method (i.e, phone, fax) to inform them of need for appointment prior to surgery.  Rosaria Ferries, PA-C 12/02/2016, 1:38 PM

## 2016-12-02 NOTE — Telephone Encounter (Signed)
Dr. Burt Knack has okayed Ms. Sutcliffe to be off her Plavix for the procedure.  Rosaria Ferries, PA-C 12/02/2016 1:50 PM Beeper [phone]

## 2016-12-02 NOTE — Telephone Encounter (Signed)
LM for patient to call back regarding scheduling appointment for pre-op clearance review. Scheduled for 11/120/18 @ 11am but will need this confirmed with patient.

## 2016-12-05 ENCOUNTER — Encounter: Payer: Self-pay | Admitting: Physician Assistant

## 2016-12-05 NOTE — Progress Notes (Addendum)
Cardiology Office Note    Date:  12/06/2016  ID:  [name], DOB Feb 28, 1945, MRN [id number] PCP:  Biagio Borg, MD  Cardiologist:  Dr. Burt Knack   Chief Complaint: pre-op eval   History of Present Illness:  [name] is a 71 y.o. female with history of CAD (inferior MI 1998 s/p PCI of RCA, stenting of Cx 04/2010, DES to prox LAD 05/2013, DES x 3 in 10/2015), AAA s/p stent graft repair in 01/2016, anxiety, chronic LBP, DM, depression, HTN, HLD, hypothyroidism, prior lower GIB/diverticular bleed 2012, obesity, former tobacco (30-40 yrs) who presents for pre-op evaluation. At last OV 08/2016 she was describing some atypical chest pain and fatigue. She's very sedentary and is quite limited by her back pain. She's not able to do much walking. She smokes intermittently when she has stress or emotional issues. She had not been taking her Crestor because it makes her feel bad. Dr. Burt Knack recommended a trial of atorvastatin. Last labs showed LDL 123, normal LFTs (11/2016), K 4.0, A1C 6.4, (10/2016), Hgb 12.8, Plt 328 (03/2016). Last cath reviewed below.  She was recently contacted by APP during pre-op clinic due to request for clearance for revision of lumbar surgery and laminectomy, with request for Plavix instructions as well. She reported significant dyspnea on exertion. She does not think she can walk 100 feet without stopping due to shortness of breath. She cannot climb a flight of steps without stopping. Part of this is also exacerbated by severe back pain. She has been dealing with this for several months. She has not had any dizziness or syncope. She continues to report atypical twinges of substernal chest pain. This lasts for minutes at a time, both at rest and with exertion but without any particular pattern. This kind of reminds her of prior angina except prior angina also included significant arm and chest heaviness which she has not noticed.   Past Medical History:  Diagnosis Date  . AAA  (abdominal aortic aneurysm) ([place])    a. s/p stent graft repair 01/2016.  [name] Allergic rhinitis, cause unspecified   . Anxiety state, unspecified   . Chronic LBP   . Coronary artery disease    a. inferior MI 1998 s/p PCI of RCA. b. stenting of Cx 04/2010. c. DES to prox LAD 05/2013. d. DES x 3 in 10/2015.  [name] Degeneration of lumbar or lumbosacral intervertebral disc   . Depressive disorder, not elsewhere classified   . Diabetes mellitus    TYPE II  . Gout 08/22/2013  . Hyperlipidemia   . Hypertension   . Hypothyroidism   . Lower GI bleed 06/2010   Diverticular bleed  . Noncompliance with medications 02/26/2014  . Obesity, unspecified     Past Surgical History:  Procedure Laterality Date  . ABDOMINAL AORTIC ENDOVASCULAR STENT GRAFT INSERTION N/A 01/22/2016   Performed by Rosetta Posner, MD at [facility]  . CARDIAC CATHETERIZATION     PCI OF BOTH THE CIRCUMFLEX AND LEFT ANTERIOR DESCENDING ARTERY  . CESAREAN SECTION    . Coronary Stent Intervention N/A 10/29/2015   Performed by Nelva Bush, MD at [facility]  . Coronary/Graft Angiography N/A 10/29/2015   Performed by Nelva Bush, MD at [facility]  . HEART STENT  04-2010  and  Jun 07, 2013   X 3  . Intravascular Pressure Wire/FFR Study N/A 10/29/2015   Performed by Nelva Bush, MD at [facility]  . LEFT HEART  CATHETERIZATION WITH CORONARY ANGIOGRAM N/A 02/25/2014   Performed by Troy Sine, MD at [facility]  . LEFT HEART CATHETERIZATION WITH CORONARY ANGIOGRAM N/A 06/07/2013   Performed by [name], MD at [facility]  . LUMBAR FUSION  01/2007   DR. Patrice Paradise...3-LEVEL WITH FIXATION  . OOPHORECTOMY     BSO? pt.unsure  . PARATHYROIDECTOMY    . SPINE SURGERY    . THYROIDECTOMY    . TOTAL ABDOMINAL HYSTERECTOMY      Current Medications: Current Meds  Medication Sig  . allopurinol (ZYLOPRIM) 300 MG tablet Take 1 tablet (300 mg total) by mouth daily.  [name] aspirin 81 MG EC tablet Take 81 mg by  mouth daily.   [name] atenolol (TENORMIN) 25 MG tablet TAKE 1 TABLET(25 MG) BY MOUTH TWICE DAILY  . atorvastatin (LIPITOR) 20 MG tablet Take 1 tablet (20 mg total) by mouth daily.  . baclofen (LIORESAL) 10 MG tablet Take 1 tablet (10 mg total) by mouth 2 (two) times daily.  . Carboxymethylcellul-Glycerin ([employer] LUBRICATING/DRY EYE OP) Place 1-2 drops into both eyes 3 (three) times daily as needed (for dry eyes.).  [name] clopidogrel (PLAVIX) 75 MG tablet Take 1 tablet (75 mg total) by mouth every evening.  . colchicine 0.6 MG tablet Take 0.6 mg by mouth 3 (three) times daily as needed (for gout flare up.).   [name] famotidine (PEPCID) 20 MG tablet Take 1 tablet (20 mg total) by mouth daily as needed (for acid reflux.).  [name] gabapentin (NEURONTIN) 300 MG capsule Take 1-2 capsules (300-600 mg total) by mouth daily as needed (for pain/neuropathy). 1-2 tab by mouth at bedtime  . metFORMIN (GLUCOPHAGE-XR) 500 MG 24 hr tablet TAKE 4 TABLETS BY MOUTH EVERY MORNING  . nitrofurantoin (MACRODANTIN) 50 MG capsule Take 1 capsule (50 mg total) by mouth 2 (two) times daily.  . nitroGLYCERIN (NITROSTAT) 0.4 MG SL tablet Place 1 tablet (0.4 mg total) under the tongue every 5 (five) minutes as needed for chest pain.  [name] oxyCODONE (ROXICODONE) 5 MG immediate release tablet Take 1 tablet (5 mg total) by mouth every 6 (six) hours as needed for severe pain.  [name] SYNTHROID 150 MCG tablet Take 1 tablet (150 mcg total) by mouth daily.  [name] tiZANidine (ZANAFLEX) 4 MG tablet Take 1 tablet (4 mg total) by mouth every 6 (six) hours as needed for muscle spasms.  . traMADol (ULTRAM) 50 MG tablet Take 50 mg by mouth every 8 (eight) hours as needed (pain).  . traZODone (DESYREL) 50 MG tablet Take 0.5-1 tablets (25-50 mg total) by mouth at bedtime as needed for sleep.  . Vitamin D, Ergocalciferol, (DRISDOL) 50000 units CAPS capsule Take 1 capsule (50,000 Units total) by mouth every 7 (seven) days.     Allergies:   Prilosec [omeprazole]; Miconazole  nitrate; Crestor [rosuvastatin calcium]; Augmentin [amoxicillin-pot clavulanate]; and Doxycycline   Social History   Socioeconomic History  . Marital status: Divorced    Spouse name: None  . Number of children: 3  . Years of education: None  . Highest education level: None  Social Needs  . Financial resource strain: None  . Food insecurity - worry: None  . Food insecurity - inability: None  . Transportation needs - medical: None  . Transportation needs - non-medical: None  Occupational History  . Occupation: RETIRED    Employer: [employer]    Comment: ADMIN SUPPORT    Employer: RETIRED  Tobacco Use  . Smoking status: Former  Smoker    Years: 30.00    Types: Cigarettes    Last attempt to quit: 05/31/1986    Years since quitting: 30.5  . Smokeless tobacco: Never Used  Substance and Sexual Activity  . Alcohol use: No  . Drug use: No  . Sexual activity: Not Currently    Comment: 1st intercourse 71 yo-Fewer than 5 partners  Other Topics Concern  . None  Social History Narrative   DIVORCED   3 CHILDREN   PATIENT SIGNED A DESIGNATED PARTY RELEASE TO ALLOW HER DAUGHTER, TRAMAINE Purves, TO HAVE ACCESS TO HER MEDICAL RECORDS/INFORMATION. [name], May 04, 2009 @ 3:27 PM   Smokes cigarettes on rare occasions.     Family History:  Family History  Problem Relation Age of Onset  . Heart attack Mother 25       s/p D&C-CARDIAC ARREST 1966  . Heart disease Mother   . Heart attack Father 40       1978 WITH MI  . Heart disease Father   . Diabetes Brother   . Diabetes Maternal Aunt   . Arthritis Maternal Aunt   . Breast cancer Maternal Aunt        Post menopausal  . Colon cancer Neg Hx     ROS:   Please see the history of present illness.  All other systems are reviewed and otherwise negative.    PHYSICAL EXAM:   VS:  BP 122/64   Pulse 74   Ht 5\' 6"  (1.676 m)   Wt 214 lb 1.9 oz (97.1 kg)   BMI 34.56 kg/m   BMI: Body mass index is 34.56 kg/m. GEN: Well  nourished, well developed AAF, obese, in no acute distress  HEENT: normocephalic, atraumatic Neck: no JVD, carotid bruits, or masses Cardiac: RRR; no murmurs, rubs, or gallops, no edema  Respiratory:  Diffusely diminished BS bilaterally, no wheezing, rales or rhonchi, normal work of breathing GI: soft, nontender, nondistended, + BS MS: no deformity or atrophy  Skin: warm and dry, no rash Neuro:  Alert and Oriented x 3, Strength and sensation are intact, follows commands Psych: euthymic mood, full affect  Wt Readings from Last 3 Encounters:  12/06/16 214 lb 1.9 oz (97.1 kg)  11/15/16 213 lb 4 oz (96.7 kg)  11/09/16 214 lb (97.1 kg)      Studies/Labs Reviewed:   EKG:  EKG was ordered today and personally reviewed by me and demonstrates NSR 74bpm, left axis deviation, RBBB, similar to prior.  Recent Labs: 01/22/2016: Magnesium 1.6 03/23/2016: Hemoglobin 12.8; Platelets 328.0; TSH 0.89 11/15/2016: BUN 10; Creatinine, Ser 0.84; Potassium 4.0; Sodium 141 11/30/2016: ALT 18   Lipid Panel    Component Value Date/Time   CHOL 181 11/30/2016 1053   TRIG 145 11/30/2016 1053   TRIG 102 01/02/2006 0825   HDL 29 (L) 11/30/2016 1053   CHOLHDL 6.2 (H) 11/30/2016 1053   CHOLHDL 5 11/15/2016 1108   VLDL 18.6 11/15/2016 1108   LDLCALC 123 (H) 11/30/2016 1053   LDLDIRECT 131.0 03/27/2015 1137    Additional studies/ records that were reviewed today include: Summarized above, includes: Conclusions: 1. Multivessel coronary artery disease, including 60% ostial and 70% proximal/mid RCA disease that is hemodynamically significant by FFR (0.71).  Mild in-stent restenosis of LAD stents, as well as 60% stenosis in the mid LCx adjacent to previously stented large OM are also present. 2. Successful FFR-guided PCI of the RCA from the ostium to the midvessel with three overlapping Promus Premier stents post-dilated  to 3.25 mm with 0% residual stenosis and TIMI-3 flow. 3. Patient had significant pain chest  pain beginning with FFR and continuing throughout intervention despite aggressive sedation and analgesia.  No clear angiographic or electrocardiographic etiology was identified.  Pain had returned to pre-cath level by the end of the procedure. Recommendations: 1. Transfer to 2[facility] for aggressive medical management of chest pain, including weaning of NTG infusion as tolerated. 2. Dual antiplatelet therapy with aspirin and ticagrelor for at least 12 months, ideally longer. 3. Aggressive secondary prevention. 4. If patient continues to have chest pain, consider repeat catheterization with FFR and/or PCI to mid LCx. 5. Due to marked tortuosity of the innominate/right subclavian artery, consider alternate vascular access for further cardiac catheterizations.    ASSESSMENT & PLAN:   1. Pre-operative evaluation/dypsnea on exertion/atypical chest pain - difficult to discern how much of her dyspnea is related to her deconditioning or perhaps other confounding factors including underlying CAD and longstanding former tobacco abuse. She did have residual disease including Cx stenosis in 2017. Breath sounds are diffusely diminished so I do suspect some degree of COPD. She is not tachycardic, tachypneic or hypoxic but EKG shows new RBBB. Will update CBC and BNP to exclude contribution of anemia (given h/o GIB) and CHF to her dyspnea. As she is unable to achieve adequate METS for cardiac clearance, will proceed with Lexiscan stress test prior to clearing for surgery. Will touch base with Dr. Burt Knack about these findings as well. Prior to formally clearing to come off Plavix, will also need to review whether recent stent graft 01/2016 has any bearings on this decision. Addednum: reviewed with Dr. Burt Knack who is in agreement with plan. He does not feel the stent graft surgery affects ability to come off Plavix if needed but will review final recs once Lexiscan results are known. 2. CAD - continue ASA, BB,  statin. 3. RBBB - as above. 4. AAA stent graft repair - followed by vascular surgery. 5. Hyperlipidemia - so far she is tolerating atorvastatin. Has f/u labs 02/2016.  Disposition: F/u with Dr. Burt Knack 02/2016, sooner if stress testing is abnormal. Will route this note to surgeon to make them aware.  Medication Adjustments/Labs and Tests Ordered: Current medicines are reviewed at length with the patient today.  Concerns regarding medicines are outlined above. Medication changes, Labs and Tests ordered today are summarized above and listed in the Patient Instructions accessible in Encounters.   Signed, Charlie Pitter, PA-C  12/06/2016 11:08 AM    [facility] [address], [place], [place]  [address] Phone: [phone]; Fax: [phone]

## 2016-12-05 NOTE — Telephone Encounter (Signed)
Pt is made aware of her appointment tomorrow with Melina Copa

## 2016-12-06 ENCOUNTER — Encounter: Payer: Self-pay | Admitting: Physician Assistant

## 2016-12-06 ENCOUNTER — Ambulatory Visit (INDEPENDENT_AMBULATORY_CARE_PROVIDER_SITE_OTHER): Payer: Medicare Other | Admitting: Physician Assistant

## 2016-12-06 VITALS — BP 122/64 | HR 74 | Ht 66.0 in | Wt 214.1 lb

## 2016-12-06 DIAGNOSIS — I2510 Atherosclerotic heart disease of native coronary artery without angina pectoris: Secondary | ICD-10-CM

## 2016-12-06 DIAGNOSIS — R0602 Shortness of breath: Secondary | ICD-10-CM

## 2016-12-06 DIAGNOSIS — E785 Hyperlipidemia, unspecified: Secondary | ICD-10-CM

## 2016-12-06 DIAGNOSIS — Z95828 Presence of other vascular implants and grafts: Secondary | ICD-10-CM

## 2016-12-06 DIAGNOSIS — Z01810 Encounter for preprocedural cardiovascular examination: Secondary | ICD-10-CM | POA: Diagnosis not present

## 2016-12-06 DIAGNOSIS — R0609 Other forms of dyspnea: Secondary | ICD-10-CM

## 2016-12-06 LAB — CBC
HEMATOCRIT: 42.2 % (ref 34.0–46.6)
HEMOGLOBIN: 14.1 g/dL (ref 11.1–15.9)
MCH: 28.3 pg (ref 26.6–33.0)
MCHC: 33.4 g/dL (ref 31.5–35.7)
MCV: 85 fL (ref 79–97)
Platelets: 337 10*3/uL (ref 150–379)
RBC: 4.99 x10E6/uL (ref 3.77–5.28)
RDW: 15.2 % (ref 12.3–15.4)
WBC: 8.8 10*3/uL (ref 3.4–10.8)

## 2016-12-06 LAB — BASIC METABOLIC PANEL
BUN/Creatinine Ratio: 15 (ref 12–28)
BUN: 13 mg/dL (ref 8–27)
CALCIUM: 9.2 mg/dL (ref 8.7–10.3)
CO2: 23 mmol/L (ref 20–29)
Chloride: 99 mmol/L (ref 96–106)
Creatinine, Ser: 0.84 mg/dL (ref 0.57–1.00)
GFR, EST AFRICAN AMERICAN: 81 mL/min/{1.73_m2} (ref >59)
GFR, EST NON AFRICAN AMERICAN: 70 mL/min/{1.73_m2} (ref >59)
Glucose: 118 mg/dL — ABNORMAL HIGH (ref 65–99)
POTASSIUM: 4.6 mmol/L (ref 3.5–5.2)
Sodium: 138 mmol/L (ref 134–144)

## 2016-12-06 LAB — PRO B NATRIURETIC PEPTIDE: NT-PRO BNP: 47 pg/mL (ref 0–301)

## 2016-12-06 NOTE — Patient Instructions (Addendum)
Medication Instructions:  Your physician recommends that you continue on your current medications as directed. Please refer to the Current Medication list given to you today.   Labwork: TODAY:  BMET, CBC, & PRO BNP  Testing/Procedures: Your physician has requested that you have a lexiscan myoview. For further information please visit [email or web address]. Please follow instruction sheet, as given.   Follow-Up: Your physician recommends that you schedule a follow-up appointment in: Drain February WITH DR. Burt Knack   Any Other Special Instructions Will Be Listed Below (If Applicable).  Regadenoson injection What is this medicine? REGADENOSON is used to test the heart for coronary artery disease. It is used in patients who can not exercise for their stress test. This medicine may be used for other purposes; ask your health care provider or pharmacist if you have questions. COMMON BRAND NAME(S): Lexiscan What should I tell my health care provider before I take this medicine? They need to know if you have any of these conditions: -heart problems -lung or breathing disease, like asthma or COPD -an unusual or allergic reaction to regadenoson, other medicines, foods, dyes, or preservatives -pregnant or trying to get pregnant -breast-feeding How should I use this medicine? This medicine is for injection into a vein. It is given by a health care professional in a hospital or clinic setting. Talk to your pediatrician regarding the use of this medicine in children. Special care may be needed. Overdosage: If you think you have taken too much of this medicine contact a poison control center or emergency room at once. NOTE: This medicine is only for you. Do not share this medicine with others. What if I miss a dose? This does not apply. What may interact with this medicine? -caffeine -dipyridamole -guarana -theophylline This list may not describe all possible  interactions. Give your health care provider a list of all the medicines, herbs, non-prescription drugs, or dietary supplements you use. Also tell them if you smoke, drink alcohol, or use illegal drugs. Some items may interact with your medicine. What should I watch for while using this medicine? Your condition will be monitored carefully while you are receiving this medicine. Do not take medicines, foods, or drinks with caffeine (like coffee, tea, or colas) for at least 12 hours before your test. If you do not know if something contains caffeine, ask your health care professional. What side effects may I notice from receiving this medicine? Side effects that you should report to your doctor or health care professional as soon as possible: -allergic reactions like skin rash, itching or hives, swelling of the face, lips, or tongue -breathing problems -chest pain, tightness or palpitations -severe headache Side effects that usually do not require medical attention (report to your doctor or health care professional if they continue or are bothersome): -flushing -headache -irritation or pain at site where injected -nausea, vomiting This list may not describe all possible side effects. Call your doctor for medical advice about side effects. You may report side effects to FDA at 1-800-FDA-1088. Where should I keep my medicine? This drug is given in a hospital or clinic and will not be stored at home. NOTE: This sheet is a summary. It may not cover all possible information. If you have questions about this medicine, talk to your doctor, pharmacist, or health care provider.  2018 Elsevier/Gold Standard (2007-09-03 15:08:13)    If you need a refill on your cardiac medications before your next appointment, please call your pharmacy.

## 2016-12-13 ENCOUNTER — Telehealth (HOSPITAL_COMMUNITY): Payer: Self-pay | Admitting: *Deleted

## 2016-12-13 NOTE — Telephone Encounter (Signed)
Left message on voicemail per DPR in reference to upcoming appointment scheduled on 12/16/16 with detailed instructions given per Myocardial Perfusion Study Information Sheet for the test. LM to arrive 15 minutes early, and that it is imperative to arrive on time for appointment to keep from having the test rescheduled. If you need to cancel or reschedule your appointment, please call the office within 24 hours of your appointment. Failure to do so may result in a cancellation of your appointment, and a $50 no show fee. Phone number given for call back for any questions. Kirstie Peri, RN

## 2016-12-16 ENCOUNTER — Ambulatory Visit (HOSPITAL_COMMUNITY): Payer: Medicare Other | Attending: Physician Assistant

## 2016-12-16 DIAGNOSIS — R0602 Shortness of breath: Secondary | ICD-10-CM | POA: Insufficient documentation

## 2016-12-16 LAB — MYOCARDIAL PERFUSION IMAGING
CHL CUP NUCLEAR SRS: 1
CHL CUP RESTING HR STRESS: 63 {beats}/min
LHR: 0.32
LV sys vol: 52 mL
LVDIAVOL: 103 mL (ref 46–106)
NUC STRESS TID: 0.94
Peak HR: 96 {beats}/min
SDS: 1
SSS: 2

## 2016-12-16 MED ORDER — REGADENOSON 0.4 MG/5ML IV SOLN
0.4000 mg | Freq: Once | INTRAVENOUS | Status: AC
  Administered 2016-12-16: 0.4 mg via INTRAVENOUS

## 2016-12-16 MED ORDER — TECHNETIUM TC 99M TETROFOSMIN IV KIT
10.2000 | PACK | Freq: Once | INTRAVENOUS | Status: AC | PRN
  Administered 2016-12-16: 10.2 via INTRAVENOUS
  Filled 2016-12-16: qty 11

## 2016-12-16 MED ORDER — TECHNETIUM TC 99M TETROFOSMIN IV KIT
30.6000 | PACK | Freq: Once | INTRAVENOUS | Status: AC | PRN
  Administered 2016-12-16: 30.6 via INTRAVENOUS
  Filled 2016-12-16: qty 31

## 2016-12-18 ENCOUNTER — Telehealth: Payer: Self-pay | Admitting: Physician Assistant

## 2016-12-18 NOTE — Telephone Encounter (Signed)
   Chart reviewed as part of pre-operative protocol coverage, now that nuclear stress test has been completed. This is low risk. Per Dr. Burt Knack, [place] to proceed with surgery. Based on ACC/AHA guidelines, [name] would be at acceptable risk for the planned procedure without further cardiovascular testing. Dr. Burt Knack feels it is OK to hold Plavix 5 days before surgery.  I will route this recommendation to the requesting party ([facility]) via [place] fax function and remove from pre-op pool.  Please call with questions.  Charlie Pitter, PA-C 12/18/2016, 7:24 AM

## 2016-12-19 ENCOUNTER — Other Ambulatory Visit: Payer: Self-pay

## 2016-12-19 DIAGNOSIS — I2510 Atherosclerotic heart disease of native coronary artery without angina pectoris: Secondary | ICD-10-CM

## 2016-12-19 MED ORDER — CLOPIDOGREL BISULFATE 75 MG PO TABS: 75.0000 mg | ORAL_TABLET | Freq: Every evening | ORAL | 3 refills | Status: DC

## 2017-01-11 ENCOUNTER — Encounter: Payer: Self-pay | Admitting: Internal Medicine

## 2017-01-11 ENCOUNTER — Ambulatory Visit (INDEPENDENT_AMBULATORY_CARE_PROVIDER_SITE_OTHER)
Admission: RE | Admit: 2017-01-11 | Discharge: 2017-01-11 | Disposition: A | Discharge status: Home / Self Care | Payer: Medicare Other | Source: Ambulatory Visit | Attending: Internal Medicine | Admitting: Internal Medicine

## 2017-01-11 ENCOUNTER — Other Ambulatory Visit (INDEPENDENT_AMBULATORY_CARE_PROVIDER_SITE_OTHER): Payer: Medicare Other

## 2017-01-11 ENCOUNTER — Ambulatory Visit: Payer: Medicare Other | Admitting: Internal Medicine

## 2017-01-11 VITALS — BP 106/70 | Temp 97.7°F | Ht 66.0 in | Wt 220.0 lb

## 2017-01-11 DIAGNOSIS — J029 Acute pharyngitis, unspecified: Secondary | ICD-10-CM | POA: Insufficient documentation

## 2017-01-11 DIAGNOSIS — E11311 Type 2 diabetes mellitus with unspecified diabetic retinopathy with macular edema: Secondary | ICD-10-CM | POA: Diagnosis not present

## 2017-01-11 DIAGNOSIS — G8929 Other chronic pain: Secondary | ICD-10-CM | POA: Diagnosis not present

## 2017-01-11 DIAGNOSIS — M545 Low back pain: Secondary | ICD-10-CM

## 2017-01-11 DIAGNOSIS — Z0000 Encounter for general adult medical examination without abnormal findings: Secondary | ICD-10-CM

## 2017-01-11 DIAGNOSIS — R1030 Lower abdominal pain, unspecified: Secondary | ICD-10-CM

## 2017-01-11 DIAGNOSIS — J019 Acute sinusitis, unspecified: Secondary | ICD-10-CM

## 2017-01-11 DIAGNOSIS — J309 Allergic rhinitis, unspecified: Secondary | ICD-10-CM | POA: Diagnosis not present

## 2017-01-11 DIAGNOSIS — E2839 Other primary ovarian failure: Secondary | ICD-10-CM

## 2017-01-11 LAB — URINALYSIS, ROUTINE W REFLEX MICROSCOPIC
BILIRUBIN URINE: NEGATIVE
HGB URINE DIPSTICK: NEGATIVE
KETONES UR: NEGATIVE
LEUKOCYTES UA: NEGATIVE
NITRITE: NEGATIVE
RBC / HPF: NONE SEEN (ref 0–?)
Specific Gravity, Urine: 1.02 (ref 1.000–1.030)
TOTAL PROTEIN, URINE-UPE24: NEGATIVE
URINE GLUCOSE: NEGATIVE
UROBILINOGEN UA: 0.2 (ref 0.0–1.0)
pH: 5.5 (ref 5.0–8.0)

## 2017-01-11 MED ORDER — METHYLPREDNISOLONE ACETATE 80 MG/ML IJ SUSP
80.0000 mg | Freq: Once | INTRAMUSCULAR | Status: AC
  Administered 2017-01-11: 80 mg via INTRAMUSCULAR

## 2017-01-11 MED ORDER — METFORMIN HCL ER 500 MG PO TB24: ORAL_TABLET | ORAL | 1 refills | Status: DC

## 2017-01-11 MED ORDER — KETOROLAC TROMETHAMINE 30 MG/ML IJ SOLN
30.0000 mg | Freq: Once | INTRAMUSCULAR | Status: AC
  Administered 2017-01-11: 30 mg via INTRAMUSCULAR

## 2017-01-11 MED ORDER — METFORMIN HCL ER 500 MG PO TB24: ORAL_TABLET | ORAL | 3 refills | Status: DC

## 2017-01-11 MED ORDER — HYDROCODONE-HOMATROPINE 5-1.5 MG/5ML PO SYRP: 5.0000 mL | ORAL_SOLUTION | Freq: Four times a day (QID) | ORAL | 0 refills | Status: AC | PRN

## 2017-01-11 MED ORDER — LEVOFLOXACIN 500 MG PO TABS: 500.0000 mg | ORAL_TABLET | Freq: Every day | ORAL | 0 refills | Status: AC

## 2017-01-11 NOTE — Patient Instructions (Addendum)
Please take all new medication as prescribed - the antibiotic, and cough medicine if needed  You had the steroid shot today, and the pain shot (toradol)  Please continue all other medications as before, and refills have been done if requested.  Please have the pharmacy call with any other refills you may need.  Please continue your efforts at being more active, low cholesterol diet, and weight control  Please keep your appointments with your specialists as you may have planned  You will be contacted regarding the referral for: mammogram and eye doctor  Please schedule the bone density test before leaving today at the scheduling desk (where you check out)  Please go to the LAB in the Basement (turn left off the elevator) for the tests to be done today - just the urine testing today  You will be contacted by phone if any changes need to be made immediately.  Otherwise, you will receive a letter about your results with an explanation, but please check with MyChart first.  Please remember to sign up for MyChart if you have not done so, as this will be important to you in the future with finding out test results, communicating by private email, and scheduling acute appointments online when needed.

## 2017-01-11 NOTE — Progress Notes (Signed)
Subjective:    Patient ID: [name], female    DOB: Aug 05, 1945, 71 y.o.   MRN: [id number]  HPI   Here with 2-3 days acute onset fever, facial pain, pressure, headache, general weakness and malaise, and greenish d/c, with mild ST and cough, but pt denies chest pain, wheezing, increased sob or doe, orthopnea, PND, increased LE swelling, palpitations, dizziness or syncope. Also - due for mammogram and optho as missed due to dealing with aneurysm this past yr.   Pt continues to have recurring LBP without change in severity, bowel or bladder change, fever, wt loss,  worsening LE pain/numbness/weakness, gait change or falls.  Denies urinary symptoms such as dysuria, frequency, urgency, flank pain, hematuria or n/v, fever, chills but has been having lower abd discomfort and along with back pain is wondering if this might be UTI.  Does have several wks ongoing nasal allergy symptoms with clearish congestion, itch and sneezing, without fever, pain, ST, cough, swelling or wheezing. Past Medical History:  Diagnosis Date  . AAA (abdominal aortic aneurysm) ([place])    a. s/p stent graft repair 01/2016.  [name] Allergic rhinitis, cause unspecified   . Anxiety state, unspecified   . Chronic LBP   . Coronary artery disease    a. inferior MI 1998 s/p PCI of RCA. b. stenting of Cx 04/2010. c. DES to prox LAD 05/2013. d. DES x 3 in 10/2015.  [name] Degeneration of lumbar or lumbosacral intervertebral disc   . Depressive disorder, not elsewhere classified   . Diabetes mellitus    TYPE II  . Gout 08/22/2013  . Hyperlipidemia   . Hypertension   . Hypothyroidism   . Lower GI bleed 06/2010   Diverticular bleed  . Noncompliance with medications 02/26/2014  . Obesity, unspecified    Past Surgical History:  Procedure Laterality Date  . CARDIAC CATHETERIZATION     PCI OF BOTH THE CIRCUMFLEX AND LEFT ANTERIOR DESCENDING ARTERY  . CARDIAC CATHETERIZATION N/A 10/29/2015   Procedure: Coronary Stent Intervention;  Surgeon:  Nelva Bush, MD;  Location: [facility];  Service: Cardiovascular;  Laterality: N/A;  . CARDIAC CATHETERIZATION N/A 10/29/2015   Procedure: Coronary/Graft Angiography;  Surgeon: Nelva Bush, MD;  Location: [facility];  Service: Cardiovascular;  Laterality: N/A;  . CARDIAC CATHETERIZATION N/A 10/29/2015   Procedure: Intravascular Pressure Wire/FFR Study;  Surgeon: Nelva Bush, MD;  Location: [facility];  Service: Cardiovascular;  Laterality: N/A;  . CESAREAN SECTION    . ENDOVASCULAR STENT INSERTION N/A 01/22/2016   Procedure: ABDOMINAL AORTIC ENDOVASCULAR STENT GRAFT INSERTION;  Surgeon: Rosetta Posner, MD;  Location: [facility];  Service: Vascular;  Laterality: N/A;  . HEART STENT  04-2010  and  Jun 07, 2013   X 3  . LEFT HEART CATHETERIZATION WITH CORONARY ANGIOGRAM N/A 06/07/2013   Procedure: LEFT HEART CATHETERIZATION WITH CORONARY ANGIOGRAM;  Surgeon: [name], MD;  Location: [facility] C[facility] LAB;  Service: Cardiovascular;  Laterality: N/A;  . LEFT HEART CATHETERIZATION WITH CORONARY ANGIOGRAM N/A 02/25/2014   Procedure: LEFT HEART CATHETERIZATION WITH CORONARY ANGIOGRAM;  Surgeon: Troy Sine, MD;  Location: [facility] CATH LAB;  Service: Cardiovascular;  Laterality: N/A;  . LUMBAR FUSION  01/2007   DR. Patrice Paradise...3-LEVEL WITH FIXATION  . OOPHORECTOMY     BSO? pt.unsure  . PARATHYROIDECTOMY    . SPINE SURGERY    . THYROIDECTOMY    . TOTAL ABDOMINAL HYSTERECTOMY      reports that she quit smoking  about 30 years ago. Her smoking use included cigarettes. She quit after 30.00 years of use. she has never used smokeless tobacco. She reports that she does not drink alcohol or use drugs. family history includes Arthritis in her maternal aunt; Breast cancer in her maternal aunt; Diabetes in her brother and maternal aunt; Heart attack (age of onset: 41) in her mother; Heart attack (age of onset: 23) in her father; Heart disease in her father and mother. Allergies  Allergen  Reactions  . Prilosec [Omeprazole] Other (See Comments)    Chest pain  . Miconazole Nitrate Hives    REACTION: hives  . Crestor [Rosuvastatin Calcium]     Feeling poor  . Augmentin [Amoxicillin-Pot Clavulanate] Hives, Itching and Rash    Has patient had a PCN reaction causing immediate rash, facial/tongue/throat swelling, SOB or lightheadedness with hypotension:unsure Has patient had a PCN reaction causing severe rash involving mucus membranes or skin necrosis:unsure Has patient had a PCN reaction that required hospitalization:No Has patient had a PCN reaction occurring within the last 10 years:NO If all of the above answers are "NO", then may proceed with Cephalosporin use. Has patient had a PCN reaction causing immediate rash, facial/tongue/throat swelling, SOB or lightheadedness with hypotension:unsure Has patient had a PCN reaction causing severe rash involving mucus membranes or skin necrosis:unsure Has patient had a PCN reaction that required hospitalization:No Has patient had a PCN reaction occurring within the last 10 years:NO If all of the above answers are "NO", then may proceed with Cephalosporin use.   [name] Doxycycline Other (See Comments)    REACTION: gi upset   Current Outpatient Medications on File Prior to Visit  Medication Sig Dispense Refill  . allopurinol (ZYLOPRIM) 300 MG tablet Take 1 tablet (300 mg total) by mouth daily. 90 tablet 3  . aspirin 81 MG EC tablet Take 81 mg by mouth daily.     [name] atenolol (TENORMIN) 25 MG tablet TAKE 1 TABLET(25 MG) BY MOUTH TWICE DAILY 180 tablet 3  . atorvastatin (LIPITOR) 20 MG tablet Take 1 tablet (20 mg total) by mouth daily. 90 tablet 3  . baclofen (LIORESAL) 10 MG tablet Take 1 tablet (10 mg total) by mouth 2 (two) times daily. 30 each 0  . Carboxymethylcellul-Glycerin ([employer]S LUBRICATING/DRY EYE OP) Place 1-2 drops into both eyes 3 (three) times daily as needed (for dry eyes.).    [name] clopidogrel (PLAVIX) 75 MG tablet Take 1 tablet (75  mg total) by mouth every evening. 90 tablet 3  . colchicine 0.6 MG tablet Take 0.6 mg by mouth 3 (three) times daily as needed (for gout flare up.).     [name] famotidine (PEPCID) 20 MG tablet Take 1 tablet (20 mg total) by mouth daily as needed (for acid reflux.).    [name] gabapentin (NEURONTIN) 300 MG capsule Take 1-2 capsules (300-600 mg total) by mouth daily as needed (for pain/neuropathy). 1-2 tab by mouth at bedtime    . nitrofurantoin (MACRODANTIN) 50 MG capsule Take 1 capsule (50 mg total) by mouth 2 (two) times daily. 20 capsule 0  . nitroGLYCERIN (NITROSTAT) 0.4 MG SL tablet Place 1 tablet (0.4 mg total) under the tongue every 5 (five) minutes as needed for chest pain. 25 tablet 3  . oxyCODONE (ROXICODONE) 5 MG immediate release tablet Take 1 tablet (5 mg total) by mouth every 6 (six) hours as needed for severe pain. 30 tablet 0  . SYNTHROID 150 MCG tablet Take 1 tablet (150 mcg total) by mouth daily. 90 tablet  2  . tiZANidine (ZANAFLEX) 4 MG tablet Take 1 tablet (4 mg total) by mouth every 6 (six) hours as needed for muscle spasms. 60 tablet 1  . traMADol (ULTRAM) 50 MG tablet Take 50 mg by mouth every 8 (eight) hours as needed (pain).    . traZODone (DESYREL) 50 MG tablet Take 0.5-1 tablets (25-50 mg total) by mouth at bedtime as needed for sleep. 90 tablet 1  . Vitamin D, Ergocalciferol, (DRISDOL) 50000 units CAPS capsule Take 1 capsule (50,000 Units total) by mouth every 7 (seven) days. 12 capsule 0   No current facility-administered medications on file prior to visit.    Review of Systems   Constitutional: Negative for other unusual diaphoresis or sweats HENT: Negative for ear discharge or swelling Eyes: Negative for other worsening visual disturbances Respiratory: Negative for stridor or other swelling  Gastrointestinal: Negative for worsening distension or other blood Genitourinary: Negative for retention or other urinary change Musculoskeletal: Negative for other MSK pain or  swelling Skin: Negative for color change or other new lesions Neurological: Negative for worsening tremors and other numbness  Psychiatric/Behavioral: Negative for worsening agitation or other fatigue All other system neg per pt    Objective:   Physical Exam BP 106/70   Temp 97.7 F (36.5 C) (Oral)   Ht 5\' 6"  (1.676 m)   Wt 220 lb (99.8 kg)   BMI 35.51 kg/m  VS noted,  Constitutional: Pt appears in NAD HENT: Head: NCAT.  Right Ear: External ear normal.  Left Ear: External ear normal.  Bilat tm's with mild erythema.  Max sinus areas mild tender.  Pharynx with mild erythema, no exudate Eyes: . Pupils are equal, round, and reactive to light. Conjunctivae and EOM are normal Nose: without d/c or deformity Neck: Neck supple. Gross normal ROM Cardiovascular: Normal rate and regular rhythm.   Pulmonary/Chest: Effort normal and breath sounds without rales or wheezing.  Abd:  Soft, with mild lower abd tender, ND, + BS, no organomegaly, no flank tender, guarding or rebound Neurological: Pt is alert. At baseline orientation, motor grossly intact Skin: Skin is warm. No rashes, other new lesions, no LE edema Psychiatric: Pt behavior is normal without agitation    Lab Results  Component Value Date   WBC 8.8 12/06/2016   HGB 14.1 12/06/2016   HCT 42.2 12/06/2016   PLT 337 12/06/2016   GLUCOSE 118 (H) 12/06/2016   CHOL 181 11/30/2016   TRIG 145 11/30/2016   HDL 29 (L) 11/30/2016   LDLDIRECT 131.0 03/27/2015   LDLCALC 123 (H) 11/30/2016   ALT 18 11/30/2016   AST 21 11/30/2016   NA 138 12/06/2016   K 4.6 12/06/2016   CL 99 12/06/2016   CREATININE 0.84 12/06/2016   BUN 13 12/06/2016   CO2 23 12/06/2016   TSH 0.89 03/23/2016   INR 1.08 01/22/2016   HGBA1C 6.4 11/15/2016   MICROALBUR 3.5 (H) 03/23/2016       Assessment & Plan:

## 2017-01-12 ENCOUNTER — Other Ambulatory Visit: Payer: Self-pay | Admitting: Internal Medicine

## 2017-01-12 ENCOUNTER — Encounter: Payer: Self-pay | Admitting: Internal Medicine

## 2017-01-12 DIAGNOSIS — Z1231 Encounter for screening mammogram for malignant neoplasm of breast: Secondary | ICD-10-CM

## 2017-01-12 NOTE — Assessment & Plan Note (Signed)
Mild to mod, for antibx course,  to f/u any worsening symptoms or concerns 

## 2017-01-12 NOTE — Assessment & Plan Note (Signed)
With likely seasonal flare - for depomedrol IM 80,  to f/u any worsening symptoms or concerns

## 2017-01-12 NOTE — Assessment & Plan Note (Signed)
?   Significance, ok for UA with labs

## 2017-01-12 NOTE — Assessment & Plan Note (Signed)
Chronic persistent, for toradol 30 IM x 1,  to f/u any worsening symptoms or concerns, o/w cont same tx

## 2017-01-19 ENCOUNTER — Telehealth: Payer: Self-pay | Admitting: Cardiovascular Disease

## 2017-01-19 NOTE — Telephone Encounter (Signed)
[name]( [facility] ) is calling to see if the Fax for the Cardiac Clearance be faxed again they did not received the fax from 12/18/16. Please fax to [phone].. Thanks

## 2017-01-19 NOTE — Telephone Encounter (Signed)
Re-faxing previous note of cardiology clearance:      Chart reviewed as part of pre-operative protocol coverage, now that nuclear stress test has been completed. This is low risk. Per Dr. Burt Knack, [place] to proceed with surgery. Based on ACC/AHA guidelines, [name] would be at acceptable risk for the planned procedure without further cardiovascular testing. Dr. Burt Knack feels it is OK to hold Plavix 5 days before surgery.  I will route this recommendation to the requesting party ([facility]) via [facility] fax function and remove from pre-op pool.  Please call with questions.  Charlie Pitter, PA-C 12/18/2016, 7:24 AM

## 2017-01-26 ENCOUNTER — Telehealth: Payer: Self-pay | Admitting: Physician Assistant

## 2017-01-26 NOTE — Telephone Encounter (Signed)
Left message for Caryl Asp and Butch Penny and informing them tha ti manually faxed over the surgical clearance letters and for them to contact office if they have not been received.

## 2017-01-26 NOTE — Telephone Encounter (Signed)
F/u message   Elkie returning RN call to speak with RN to get surgical clearance info faxed to her please call back to discuss

## 2017-01-27 ENCOUNTER — Encounter: Payer: Self-pay | Admitting: Family

## 2017-01-27 ENCOUNTER — Ambulatory Visit (INDEPENDENT_AMBULATORY_CARE_PROVIDER_SITE_OTHER): Payer: Medicare Other | Admitting: Family

## 2017-01-27 VITALS — BP 110/70 | HR 72 | Temp 98.1°F | Ht 66.0 in | Wt 219.1 lb

## 2017-01-27 DIAGNOSIS — R05 Cough: Secondary | ICD-10-CM | POA: Diagnosis not present

## 2017-01-27 DIAGNOSIS — J069 Acute upper respiratory infection, unspecified: Secondary | ICD-10-CM | POA: Diagnosis not present

## 2017-01-27 DIAGNOSIS — R52 Pain, unspecified: Secondary | ICD-10-CM

## 2017-01-27 DIAGNOSIS — R059 Cough, unspecified: Secondary | ICD-10-CM

## 2017-01-27 MED ORDER — KETOROLAC TROMETHAMINE 30 MG/ML IJ SOLN: 30.0000 mg | Freq: Once | INTRAMUSCULAR | Status: DC

## 2017-01-27 MED ORDER — AZITHROMYCIN 250 MG PO TABS: ORAL_TABLET | ORAL | 0 refills | Status: DC

## 2017-01-27 MED ORDER — KETOROLAC TROMETHAMINE 30 MG/ML IJ SOLN
30.0000 mg | Freq: Once | INTRAMUSCULAR | Status: AC
  Administered 2017-01-27: 30 mg via INTRAMUSCULAR

## 2017-01-27 NOTE — Patient Instructions (Signed)
Please pick up the Hycodan cough syrup that Dr. [name] called in for you in December; increase fluids, rest;  Call back if you develop a yeast infection.

## 2017-01-27 NOTE — Addendum Note (Signed)
Addended by: Marcina Millard on: 01/27/2017 10:08 AM   Modules accepted: Orders

## 2017-01-27 NOTE — Progress Notes (Signed)
[name] is a 72 y.o. female with the following history as recorded in EpicCare:  Patient Active Problem List   Diagnosis Date Noted  . Pharyngitis 01/11/2017  . Vaginal yeast infection 07/05/2016  . Lower abdominal pain 07/05/2016  . Chronic low back pain 06/28/2016  . Insomnia 06/28/2016  . History of endovascular stent graft for abdominal aortic aneurysm (AAA) 01/22/2016  . Lumbar radiculopathy 05/15/2015  . Acute sinus infection 05/14/2015  . Arthritis of left hip 04/16/2015  . Acute gout 01/16/2015  . Acute upper respiratory infection 06/09/2014  . Angioedema of lips 06/03/2014  . Frequent urination 05/08/2014  . Acute epiglottitis without obstruction 05/05/2014  . Angioedema   . Neck swelling   . Abdominal tenderness 03/26/2014  . Noncompliance with medications 02/26/2014  . AAA (abdominal aortic aneurysm) ([place])   . Chest pain, stable coronary arteries at cath, non cardiac pain, resolved 02/24/2014  . Jaw pain 09/25/2013  . Myalgia 09/25/2013  . Gout 08/22/2013  . Abdominal pain, unspecified site 08/13/2013  . Musculoskeletal pain 08/04/2013  . Aftercare following surgery of the circulatory system, [place] 06/25/2013  . Peripheral vascular disease, unspecified ([place]) 06/19/2012  . Dry cough 03/05/2012  . Dysuria 04/21/2011  . Low back pain 10/04/2010  . Acute ischemic colitis ([place]) 07/29/2010  . Lower GI bleed 07/23/2010  . Liver lesion 07/23/2010  . Encounter for well adult exam with abnormal findings 07/22/2010  . URTICARIA 09/23/2009  . TOBACCO ABUSE 11/18/2008  . Obesity 11/17/2008  . MENOPAUSAL DISORDER 10/29/2007  . ANXIETY 06/22/2007  . DEPRESSION 06/22/2007  . OTITIS MEDIA, SEROUS, CHRONIC 04/23/2007  . CONSTIPATION 02/07/2007  . [place] DISEASE, LUMBOSACRAL SPINE 02/07/2007  . Diabetes ([place]) 11/03/2006  . Allergic rhinitis 11/03/2006  . HYPOTHYROIDISM 07/31/2006  . Hyperlipidemia 07/31/2006  . Essential hypertension 07/31/2006  . Coronary artery  disease involving native heart without angina pectoris 07/31/2006    Current Outpatient Medications  Medication Sig Dispense Refill  . allopurinol (ZYLOPRIM) 300 MG tablet Take 1 tablet (300 mg total) by mouth daily. 90 tablet 3  . aspirin 81 MG EC tablet Take 81 mg by mouth daily.     [name] atenolol (TENORMIN) 25 MG tablet TAKE 1 TABLET(25 MG) BY MOUTH TWICE DAILY 180 tablet 3  . atorvastatin (LIPITOR) 20 MG tablet Take 1 tablet (20 mg total) by mouth daily. 90 tablet 3  . baclofen (LIORESAL) 10 MG tablet Take 1 tablet (10 mg total) by mouth 2 (two) times daily. 30 each 0  . Carboxymethylcellul-Glycerin ([employer]S LUBRICATING/DRY EYE OP) Place 1-2 drops into both eyes 3 (three) times daily as needed (for dry eyes.).    [name] clopidogrel (PLAVIX) 75 MG tablet Take 1 tablet (75 mg total) by mouth every evening. 90 tablet 3  . colchicine 0.6 MG tablet Take 0.6 mg by mouth 3 (three) times daily as needed (for gout flare up.).     [name] famotidine (PEPCID) 20 MG tablet Take 1 tablet (20 mg total) by mouth daily as needed (for acid reflux.).    [name] gabapentin (NEURONTIN) 300 MG capsule Take 1-2 capsules (300-600 mg total) by mouth daily as needed (for pain/neuropathy). 1-2 tab by mouth at bedtime    . metFORMIN (GLUCOPHAGE-XR) 500 MG 24 hr tablet TAKE 4 TABLETS BY MOUTH EVERY MORNING 360 tablet 3  . nitrofurantoin (MACRODANTIN) 50 MG capsule Take 1 capsule (50 mg total) by mouth 2 (two) times daily. 20 capsule 0  . nitroGLYCERIN (NITROSTAT) 0.4 MG SL tablet Place 1 tablet (  0.4 mg total) under the tongue every 5 (five) minutes as needed for chest pain. 25 tablet 3  . oxyCODONE (ROXICODONE) 5 MG immediate release tablet Take 1 tablet (5 mg total) by mouth every 6 (six) hours as needed for severe pain. 30 tablet 0  . SYNTHROID 150 MCG tablet Take 1 tablet (150 mcg total) by mouth daily. 90 tablet 2  . tiZANidine (ZANAFLEX) 4 MG tablet Take 1 tablet (4 mg total) by mouth every 6 (six) hours as needed for muscle spasms. 60  tablet 1  . traMADol (ULTRAM) 50 MG tablet Take 50 mg by mouth every 8 (eight) hours as needed (pain).    . traZODone (DESYREL) 50 MG tablet Take 0.5-1 tablets (25-50 mg total) by mouth at bedtime as needed for sleep. 90 tablet 1  . Vitamin D, Ergocalciferol, (DRISDOL) 50000 units CAPS capsule Take 1 capsule (50,000 Units total) by mouth every 7 (seven) days. 12 capsule 0  . azithromycin (ZITHROMAX) 250 MG tablet 2 tabs po qd x 1 day; 1 tablet per day x 4 days; 6 tablet 0   Current Facility-Administered Medications  Medication Dose Route Frequency Provider Last Rate Last Dose  . ketorolac (TORADOL) 30 MG/ML injection 30 mg  30 mg Intramuscular Once Marrian Salvage, FNP        Allergies: Prilosec [omeprazole]; Miconazole nitrate; Crestor [rosuvastatin calcium]; Augmentin [amoxicillin-pot clavulanate]; and Doxycycline  Past Medical History:  Diagnosis Date  . AAA (abdominal aortic aneurysm) ([place])    a. s/p stent graft repair 01/2016.  [name] Allergic rhinitis, cause unspecified   . Anxiety state, unspecified   . Chronic LBP   . Coronary artery disease    a. inferior MI 1998 s/p PCI of RCA. b. stenting of Cx 04/2010. c. DES to prox LAD 05/2013. d. DES x 3 in 10/2015.  [name] Degeneration of lumbar or lumbosacral intervertebral disc   . Depressive disorder, not elsewhere classified   . Diabetes mellitus    TYPE II  . Gout 08/22/2013  . Hyperlipidemia   . Hypertension   . Hypothyroidism   . Lower GI bleed 06/2010   Diverticular bleed  . Noncompliance with medications 02/26/2014  . Obesity, unspecified     Past Surgical History:  Procedure Laterality Date  . CARDIAC CATHETERIZATION     PCI OF BOTH THE CIRCUMFLEX AND LEFT ANTERIOR DESCENDING ARTERY  . CARDIAC CATHETERIZATION N/A 10/29/2015   Procedure: Coronary Stent Intervention;  Surgeon: Nelva Bush, MD;  Location: [facility];  Service: Cardiovascular;  Laterality: N/A;  . CARDIAC CATHETERIZATION N/A 10/29/2015   Procedure:  Coronary/Graft Angiography;  Surgeon: Nelva Bush, MD;  Location: [facility];  Service: Cardiovascular;  Laterality: N/A;  . CARDIAC CATHETERIZATION N/A 10/29/2015   Procedure: Intravascular Pressure Wire/FFR Study;  Surgeon: Nelva Bush, MD;  Location: [facility];  Service: Cardiovascular;  Laterality: N/A;  . CESAREAN SECTION    . ENDOVASCULAR STENT INSERTION N/A 01/22/2016   Procedure: ABDOMINAL AORTIC ENDOVASCULAR STENT GRAFT INSERTION;  Surgeon: Rosetta Posner, MD;  Location: [facility];  Service: Vascular;  Laterality: N/A;  . HEART STENT  04-2010  and  Jun 07, 2013   X 3  . LEFT HEART CATHETERIZATION WITH CORONARY ANGIOGRAM N/A 06/07/2013   Procedure: LEFT HEART CATHETERIZATION WITH CORONARY ANGIOGRAM;  Surgeon: [name], MD;  Location: [facility] CATH LAB;  Service: Cardiovascular;  Laterality: N/A;  . LEFT HEART CATHETERIZATION WITH CORONARY ANGIOGRAM N/A 02/25/2014   Procedure: LEFT HEART CATHETERIZATION  WITH CORONARY ANGIOGRAM;  Surgeon: Troy Sine, MD;  Location: [facility] CATH LAB;  Service: Cardiovascular;  Laterality: N/A;  . LUMBAR FUSION  01/2007   DR. Patrice Paradise...3-LEVEL WITH FIXATION  . OOPHORECTOMY     BSO? pt.unsure  . PARATHYROIDECTOMY    . SPINE SURGERY    . THYROIDECTOMY    . TOTAL ABDOMINAL HYSTERECTOMY      Family History  Problem Relation Age of Onset  . Heart attack Mother 72       s/p D&C-CARDIAC ARREST 1966  . Heart disease Mother   . Heart attack Father 54       1978 WITH MI  . Heart disease Father   . Diabetes Brother   . Diabetes Maternal Aunt   . Arthritis Maternal Aunt   . Breast cancer Maternal Aunt        Post menopausal  . Colon cancer Neg Hx     Social History   Tobacco Use  . Smoking status: Former Smoker    Years: 30.00    Types: Cigarettes    Last attempt to quit: 05/31/1986    Years since quitting: 30.6  . Smokeless tobacco: Never Used  Substance Use Topics  . Alcohol use: No    Subjective:  Started with sudden onset  of flu like symptoms earlier this week- "just hit me out of the blue." c/o cough, congestion, body aches; feels like she has been running fever as she has been experiencing chills; requesting shot for "pain" like she got from her PCP in December; denies any shortness of breath or difficulty breathing; feels that "my belly is sore from all the coughing." Notes that she already has Hycodan cough syrup given to her by PCP- not currently taking; Quit smoking 30+ years ago;    Objective:  Vitals:   01/27/17 0918  BP: 110/70  Pulse: 72  Temp: 98.1 F (36.7 C)  TempSrc: Oral  SpO2: 96%  Weight: 219 lb 1.9 oz (99.4 kg)  Height: 5\' 6"  (1.676 m)    General: Well developed, well nourished, in no acute distress  Skin : Warm and dry.  Head: Normocephalic and atraumatic  Eyes: Sclera and conjunctiva clear; pupils round and reactive to light; extraocular movements intact  Ears: External normal; canals clear; tympanic membranes congested Oropharynx: Pink, supple. No suspicious lesions  Neck: Supple without thyromegaly, adenopathy  Lungs: Respirations unlabored; clear to auscultation bilaterally without wheeze, rales, rhonchi  CVS exam: normal rate and regular rhythm.  Neurologic: Alert and oriented; speech intact; face symmetrical; moves all extremities well; CNII-XII intact without focal deficit  Assessment:  1. Generalized body aches   2. Cough   3. Acute URI     Plan:  Suspect flu- like illness but symptoms have been present for more than 48 hours- do not feel that Tamiflu will be effective; am concerned about nature of cough and possible progression to bacterial infection- Rx for Z-pak and stressed to use Hycodan cough syrup as previously prescribed; she is given Toradol IM 30 as requested; increase fluids, rest and follow-up worse, no better.   No Follow-up on file.  No orders of the defined types were placed in this encounter.   Requested Prescriptions   Signed Prescriptions Disp Refills   . azithromycin (ZITHROMAX) 250 MG tablet 6 tablet 0    Sig: 2 tabs po qd x 1 day; 1 tablet per day x 4 days;

## 2017-01-27 NOTE — Telephone Encounter (Signed)
F/u Message  Joy call stating she has not received the clearance information for pt. She ask if it can be resent or mailed to her.Gabriela Eves [address] [place], [place] [address]

## 2017-02-01 NOTE — Telephone Encounter (Signed)
S/w Joy, fax was received for pre-op clearance.

## 2017-02-01 NOTE — Telephone Encounter (Signed)
Theora Gianotti, NP      01/19/17 2:51 PM  Note    Re-faxing previous note of cardiology clearance:     Chart reviewed as part of pre-operative protocol coverage, now that nuclear stress test has been completed. This is low risk. Per Dr. Burt Knack, [place] to proceed with surgery. Based on ACC/AHA guidelines,Brandi H Brownwould be at acceptable risk for the planned procedure without further cardiovascular testing. Dr. Burt Knack feels it is OK to hold Plavix 5 days before surgery.  I will route this recommendation to the requesting party([facility] fax function and remove from pre-op pool.  Please call with questions.  Charlie Pitter, PA-C 12/18/2016,7:24 AM

## 2017-02-03 ENCOUNTER — Ambulatory Visit: Payer: Medicare Other

## 2017-02-13 DIAGNOSIS — G959 Disease of spinal cord, unspecified: Secondary | ICD-10-CM | POA: Insufficient documentation

## 2017-02-13 MED ORDER — METFORMIN HCL ER 500 MG PO TB24
500.00 | ORAL_TABLET | ORAL | Status: DC
Start: 2017-02-14 — End: 2017-02-13

## 2017-02-13 MED ORDER — POLYETHYLENE GLYCOL 3350 17 G PO PACK
17.00 g | PACK | ORAL | Status: DC
Start: ? — End: 2017-02-13

## 2017-02-13 MED ORDER — TIZANIDINE HCL 4 MG PO TABS
4.00 | ORAL_TABLET | ORAL | Status: DC
Start: ? — End: 2017-02-13

## 2017-02-13 MED ORDER — DIPHENHYDRAMINE HCL 25 MG PO CAPS
25.00 | ORAL_CAPSULE | ORAL | Status: DC
Start: ? — End: 2017-02-13

## 2017-02-13 MED ORDER — CLOPIDOGREL BISULFATE 75 MG PO TABS
75.00 | ORAL_TABLET | ORAL | Status: DC
Start: 2017-02-14 — End: 2017-02-13

## 2017-02-13 MED ORDER — LEVOTHYROXINE SODIUM 150 MCG PO TABS
150.00 | ORAL_TABLET | ORAL | Status: DC
Start: 2017-02-14 — End: 2017-02-13

## 2017-02-13 MED ORDER — DEXTROSE 50 % IV SOLN
12.00 g | INTRAVENOUS | Status: DC
Start: ? — End: 2017-02-13

## 2017-02-13 MED ORDER — DOCUSATE SODIUM 100 MG PO CAPS
100.00 | ORAL_CAPSULE | ORAL | Status: DC
Start: 2017-02-13 — End: 2017-02-13

## 2017-02-13 MED ORDER — ONDANSETRON HCL 4 MG/2ML IJ SOLN
4.00 | INTRAMUSCULAR | Status: DC
Start: ? — End: 2017-02-13

## 2017-02-13 MED ORDER — DIAZEPAM 5 MG PO TABS
5.00 | ORAL_TABLET | ORAL | Status: DC
Start: ? — End: 2017-02-13

## 2017-02-13 MED ORDER — NITROGLYCERIN 0.4 MG SL SUBL
.40 | SUBLINGUAL_TABLET | SUBLINGUAL | Status: DC
Start: ? — End: 2017-02-13

## 2017-02-13 MED ORDER — MORPHINE SULFATE 15 MG PO TABS
15.00 | ORAL_TABLET | ORAL | Status: DC
Start: ? — End: 2017-02-13

## 2017-02-13 MED ORDER — INSULIN LISPRO 100 UNIT/ML ~~LOC~~ SOLN
2.00 | SUBCUTANEOUS | Status: DC
Start: 2017-02-13 — End: 2017-02-13

## 2017-02-13 MED ORDER — GLUCOSE 40 % PO GEL
15.00 g | ORAL | Status: DC
Start: ? — End: 2017-02-13

## 2017-02-13 MED ORDER — METOPROLOL TARTRATE 25 MG PO TABS
25.00 | ORAL_TABLET | ORAL | Status: DC
Start: 2017-02-13 — End: 2017-02-13

## 2017-02-13 MED ORDER — GENERIC EXTERNAL MEDICATION
1.00 | Status: DC
Start: ? — End: 2017-02-13

## 2017-02-13 MED ORDER — ASPIRIN EC 81 MG PO TBEC
81.00 | DELAYED_RELEASE_TABLET | ORAL | Status: DC
Start: 2017-02-14 — End: 2017-02-13

## 2017-02-21 DIAGNOSIS — Z7409 Other reduced mobility: Secondary | ICD-10-CM | POA: Insufficient documentation

## 2017-02-22 MED ORDER — CLOPIDOGREL BISULFATE 75 MG PO TABS
75.00 | ORAL_TABLET | ORAL | Status: DC
Start: 2017-02-22 — End: 2017-02-22

## 2017-02-22 MED ORDER — OXYCODONE HCL 5 MG PO TABS
5.00 | ORAL_TABLET | ORAL | Status: DC
Start: ? — End: 2017-02-22

## 2017-02-22 MED ORDER — PANTOPRAZOLE SODIUM 40 MG PO TBEC
40.00 | DELAYED_RELEASE_TABLET | ORAL | Status: DC
Start: 2017-02-22 — End: 2017-02-22

## 2017-02-22 MED ORDER — TRAMADOL HCL 50 MG PO TABS
50.00 | ORAL_TABLET | ORAL | Status: DC
Start: ? — End: 2017-02-22

## 2017-02-22 MED ORDER — GENERIC EXTERNAL MEDICATION
30.00 | Status: DC
Start: ? — End: 2017-02-22

## 2017-02-22 MED ORDER — POLYETHYLENE GLYCOL 3350 17 G PO PACK
17.00 g | PACK | ORAL | Status: DC
Start: ? — End: 2017-02-22

## 2017-02-22 MED ORDER — NITROGLYCERIN 0.4 MG SL SUBL
.40 | SUBLINGUAL_TABLET | SUBLINGUAL | Status: DC
Start: ? — End: 2017-02-22

## 2017-02-22 MED ORDER — ASPIRIN EC 81 MG PO TBEC
81.00 | DELAYED_RELEASE_TABLET | ORAL | Status: DC
Start: 2017-02-22 — End: 2017-02-22

## 2017-02-22 MED ORDER — LEVOTHYROXINE SODIUM 150 MCG PO TABS
150.00 | ORAL_TABLET | ORAL | Status: DC
Start: 2017-02-22 — End: 2017-02-22

## 2017-02-22 MED ORDER — ACETAMINOPHEN 500 MG PO TABS
1000.00 | ORAL_TABLET | ORAL | Status: DC
Start: 2017-02-21 — End: 2017-02-22

## 2017-02-22 MED ORDER — DOCUSATE SODIUM 100 MG PO CAPS
200.00 | ORAL_CAPSULE | ORAL | Status: DC
Start: 2017-02-22 — End: 2017-02-22

## 2017-02-22 MED ORDER — GABAPENTIN 300 MG PO CAPS
600.00 | ORAL_CAPSULE | ORAL | Status: DC
Start: 2017-02-21 — End: 2017-02-22

## 2017-02-22 MED ORDER — MORPHINE SULFATE ER 15 MG PO TBCR
15.00 | EXTENDED_RELEASE_TABLET | ORAL | Status: DC
Start: 2017-02-22 — End: 2017-02-22

## 2017-02-22 MED ORDER — POLYSACCHARIDE IRON COMPLEX 150 MG PO CAPS
150.00 | ORAL_CAPSULE | ORAL | Status: DC
Start: 2017-02-22 — End: 2017-02-22

## 2017-02-22 MED ORDER — TRAZODONE HCL 50 MG PO TABS
25.00 | ORAL_TABLET | ORAL | Status: DC
Start: ? — End: 2017-02-22

## 2017-02-22 MED ORDER — METFORMIN HCL ER 500 MG PO TB24
500.00 | ORAL_TABLET | ORAL | Status: DC
Start: 2017-02-22 — End: 2017-02-22

## 2017-02-22 MED ORDER — LORATADINE 10 MG PO TABS
10.00 | ORAL_TABLET | ORAL | Status: DC
Start: 2017-02-22 — End: 2017-02-22

## 2017-02-22 MED ORDER — POLYETHYLENE GLYCOL 3350 17 G PO PACK
17.00 g | PACK | ORAL | Status: DC
Start: 2017-02-22 — End: 2017-02-22

## 2017-02-24 ENCOUNTER — Emergency Department (HOSPITAL_COMMUNITY): Payer: Medicare Other

## 2017-02-24 ENCOUNTER — Emergency Department (HOSPITAL_COMMUNITY)
Admission: EM | Admit: 2017-02-24 | Discharge: 2017-02-25 | Disposition: A | Discharge status: Home / Self Care | Payer: Medicare Other | Attending: Emergency Medicine | Admitting: Emergency Medicine

## 2017-02-24 DIAGNOSIS — Z7984 Long term (current) use of oral hypoglycemic drugs: Secondary | ICD-10-CM | POA: Diagnosis not present

## 2017-02-24 DIAGNOSIS — R1084 Generalized abdominal pain: Secondary | ICD-10-CM

## 2017-02-24 DIAGNOSIS — Z87891 Personal history of nicotine dependence: Secondary | ICD-10-CM | POA: Diagnosis not present

## 2017-02-24 DIAGNOSIS — W06XXXA Fall from bed, initial encounter: Secondary | ICD-10-CM | POA: Diagnosis not present

## 2017-02-24 DIAGNOSIS — R1032 Left lower quadrant pain: Secondary | ICD-10-CM | POA: Insufficient documentation

## 2017-02-24 DIAGNOSIS — K5903 Drug induced constipation: Secondary | ICD-10-CM

## 2017-02-24 DIAGNOSIS — E119 Type 2 diabetes mellitus without complications: Secondary | ICD-10-CM | POA: Insufficient documentation

## 2017-02-24 DIAGNOSIS — I10 Essential (primary) hypertension: Secondary | ICD-10-CM | POA: Insufficient documentation

## 2017-02-24 DIAGNOSIS — I2510 Atherosclerotic heart disease of native coronary artery without angina pectoris: Secondary | ICD-10-CM | POA: Diagnosis not present

## 2017-02-24 DIAGNOSIS — Z7982 Long term (current) use of aspirin: Secondary | ICD-10-CM | POA: Insufficient documentation

## 2017-02-24 DIAGNOSIS — Z79899 Other long term (current) drug therapy: Secondary | ICD-10-CM | POA: Insufficient documentation

## 2017-02-24 DIAGNOSIS — E039 Hypothyroidism, unspecified: Secondary | ICD-10-CM | POA: Diagnosis not present

## 2017-02-24 LAB — BASIC METABOLIC PANEL
ANION GAP: 9 (ref 5–15)
BUN: 11 mg/dL (ref 6–20)
CALCIUM: 8.8 mg/dL — AB (ref 8.9–10.3)
CO2: 27 mmol/L (ref 22–32)
CREATININE: 0.92 mg/dL (ref 0.44–1.00)
Chloride: 104 mmol/L (ref 101–111)
GFR calc Af Amer: >60 mL/min (ref >60)
GLUCOSE: 137 mg/dL — AB (ref 65–99)
Potassium: 4.1 mmol/L (ref 3.5–5.1)
Sodium: 140 mmol/L (ref 135–145)

## 2017-02-24 LAB — CBC WITH DIFFERENTIAL/PLATELET
BASOS ABS: 0 10*3/uL (ref 0.0–0.1)
BASOS PCT: 0 %
Eosinophils Absolute: 0.1 10*3/uL (ref 0.0–0.7)
Eosinophils Relative: 1 %
HCT: 33.6 % — ABNORMAL LOW (ref 36.0–46.0)
Hemoglobin: 10.7 g/dL — ABNORMAL LOW (ref 12.0–15.0)
LYMPHS PCT: 15 %
Lymphs Abs: 1.9 10*3/uL (ref 0.7–4.0)
MCH: 27 pg (ref 26.0–34.0)
MCHC: 31.8 g/dL (ref 30.0–36.0)
MCV: 84.6 fL (ref 78.0–100.0)
MONO ABS: 0.7 10*3/uL (ref 0.1–1.0)
Monocytes Relative: 6 %
Neutro Abs: 10 10*3/uL — ABNORMAL HIGH (ref 1.7–7.7)
Neutrophils Relative %: 78 %
PLATELETS: 577 10*3/uL — AB (ref 150–400)
RBC: 3.97 MIL/uL (ref 3.87–5.11)
RDW: 14.8 % (ref 11.5–15.5)
WBC: 12.7 10*3/uL — ABNORMAL HIGH (ref 4.0–10.5)

## 2017-02-24 LAB — URINALYSIS, ROUTINE W REFLEX MICROSCOPIC
Bilirubin Urine: NEGATIVE
GLUCOSE, UA: NEGATIVE mg/dL
HGB URINE DIPSTICK: NEGATIVE
KETONES UR: NEGATIVE mg/dL
Leukocytes, UA: NEGATIVE
Nitrite: NEGATIVE
PROTEIN: NEGATIVE mg/dL
Specific Gravity, Urine: 1.013 (ref 1.005–1.030)
pH: 5 (ref 5.0–8.0)

## 2017-02-24 MED ORDER — MAGNESIUM CITRATE PO SOLN
1.0000 | Freq: Once | ORAL | Status: AC
  Administered 2017-02-24: 1 via ORAL
  Filled 2017-02-24: qty 296

## 2017-02-24 MED ORDER — MORPHINE SULFATE (PF) 2 MG/ML IV SOLN
2.0000 mg | Freq: Once | INTRAVENOUS | Status: DC
  Administered 2017-02-24: 2 mg via INTRAMUSCULAR
  Filled 2017-02-24: qty 1

## 2017-02-24 MED ORDER — SENNOSIDES-DOCUSATE SODIUM 8.6-50 MG PO TABS
2.0000 | ORAL_TABLET | Freq: Once | ORAL | Status: AC
  Administered 2017-02-24: 2 via ORAL
  Filled 2017-02-24: qty 2

## 2017-02-24 MED ORDER — MORPHINE SULFATE (PF) 2 MG/ML IV SOLN
2.0000 mg | Freq: Once | INTRAVENOUS | Status: AC
  Administered 2017-02-24: 2 mg via INTRAVENOUS
  Filled 2017-02-24: qty 1

## 2017-02-24 MED ORDER — MORPHINE SULFATE (PF) 4 MG/ML IV SOLN: 4.0000 mg | Freq: Once | INTRAVENOUS | Status: DC

## 2017-02-24 MED ORDER — LORAZEPAM 2 MG/ML IJ SOLN
0.5000 mg | Freq: Once | INTRAMUSCULAR | Status: AC
  Administered 2017-02-24: 0.5 mg via INTRAVENOUS
  Filled 2017-02-24: qty 1

## 2017-02-24 MED ORDER — OXYCODONE-ACETAMINOPHEN 5-325 MG PO TABS
2.0000 | ORAL_TABLET | Freq: Once | ORAL | Status: AC
  Administered 2017-02-24: 2 via ORAL
  Filled 2017-02-24: qty 2

## 2017-02-24 MED ORDER — LACTULOSE 10 GM/15ML PO SOLN: 20.0000 g | Freq: Three times a day (TID) | ORAL | 0 refills | Status: AC

## 2017-02-24 NOTE — ED Notes (Signed)
Bed: [facility] Expected date:  Expected time:  Means of arrival:  Comments: 

## 2017-02-24 NOTE — ED Notes (Signed)
Pt reports needing to go to the bathroom. Writer helped her sit up, then she decided that she didn't have to go anymore. She said, "maybe after I drink some coffee."

## 2017-02-24 NOTE — ED Notes (Signed)
ED Provider at bedside. 

## 2017-02-24 NOTE — ED Provider Notes (Addendum)
17: 35-patient is here for evaluation of injuries from fall out of bed.  Checkup from prior providers to evaluate urinalysis returns.  Apparently this was ordered to seek possible causes of weakness, and/or injury.  She had chest x-ray and CT imaging, to evaluate for traumatic injury.     No data found.  5:46 PM Reevaluation with update and discussion. After initial assessment and treatment, an updated evaluation reveals patient continues to be uncomfortable complaining of upper abdominal pain, now.  She states that this pain has been present for at least a week, and is associated with no bowel movements, recently.  She remembers having a bowel movement about a week ago when she was in the hospital.  She is taking narcotics at home.  She underwent extensive back surgery, on 02/07/17, followed by hospitalization and then a rehabilitation admission.  The patient has been instructed to use both MiraLAX and Colace, for aid with stooling.  It is unclear if she is actually taking that.  I discussed the situation with her daughter Antoine Poche, who was at her home and is looking at her medication list.  Patient will be evaluated further with rectal exam, considering enema if impacted, and possibly changing stool softener. Daleen Bo     No data found.  11:52 PM Reevaluation with update and discussion. After initial assessment and treatment, an updated evaluation reveals patient is now comfortable and ready to go home.  Her daughter is here and will take her.  Findings discussed and questions answered.[place] decision making-fall without serious injury.  Abdominal pain secondary to constipation.  Constipation is likely multifactorial, and associated with ongoing use of narcotic pain medicine.  Doubt serious bacterial infection, metabolic instability or impending vascular collapse.  Plan: Change stool softener to lactulose, encourage increased fluid intake and fiber in diet.  Follow-up with  PCP and neurosurgery, as planned and as needed.   Clinical Course as of Mar 03 1052  Fri Feb 24, 2017  1753 Pt wants me to call her daughter, [name] and [place], [phone].   [EW]  0923 HCT: (!) 33.6 [EW]    Clinical Course User Index [EW] Daleen Bo, MD     Daleen Bo, MD 02/24/17 Marlyn Corporal    Daleen Bo, MD 03/03/17 [phone]

## 2017-02-24 NOTE — ED Provider Notes (Signed)
[facility] DEPT Provider Note   CSN: [id number] Arrival date & time: 02/24/17  1309     History   Chief Complaint Chief Complaint  Patient presents with  . Back Pain    HPI [name] is a 72 y.o. female with PMH AAA, CAD, HTN, HLD, DM II, and recent T10-sacral posterior spinal fusion surgery revision presenting with left sided flank pain after a fall out of bed. Patient's surgery was on 02/07/2017.   Patient does not remember falling out of bed, but woke up on the floor with left sided flank pain. Patient's daughter found the patient and called EMS. She states her abdomen feels like its swelling. Patient has not had a bowel movement in three days, not on bowel regimen. She denies recent nausea, vomiting, or infectious symptoms. She has been in her normal state of health prior to her fall. She is prescribed plavix, but has not taken it in the past few days. The patient recently got home from inpatient rehab on 2/6, following her posterior spinal fusion surgery. Patient also states she hit her mouth and her left ear when she fell. Patient denies head or neck pain.     Past Medical History:  Diagnosis Date  . AAA (abdominal aortic aneurysm) ([place])    a. s/p stent graft repair 01/2016.  [name] Allergic rhinitis, cause unspecified   . Anxiety state, unspecified   . Chronic LBP   . Coronary artery disease    a. inferior MI 1998 s/p PCI of RCA. b. stenting of Cx 04/2010. c. DES to prox LAD 05/2013. d. DES x 3 in 10/2015.  [name] Degeneration of lumbar or lumbosacral intervertebral disc   . Depressive disorder, not elsewhere classified   . Diabetes mellitus    TYPE II  . Gout 08/22/2013  . Hyperlipidemia   . Hypertension   . Hypothyroidism   . Lower GI bleed 06/2010   Diverticular bleed  . Noncompliance with medications 02/26/2014  . Obesity, unspecified     Patient Active Problem List   Diagnosis Date Noted  . Pharyngitis 01/11/2017  . Vaginal yeast  infection 07/05/2016  . Lower abdominal pain 07/05/2016  . Chronic low back pain 06/28/2016  . Insomnia 06/28/2016  . History of endovascular stent graft for abdominal aortic aneurysm (AAA) 01/22/2016  . Lumbar radiculopathy 05/15/2015  . Acute sinus infection 05/14/2015  . Arthritis of left hip 04/16/2015  . Acute gout 01/16/2015  . Acute upper respiratory infection 06/09/2014  . Angioedema of lips 06/03/2014  . Frequent urination 05/08/2014  . Acute epiglottitis without obstruction 05/05/2014  . Angioedema   . Neck swelling   . Abdominal tenderness 03/26/2014  . Noncompliance with medications 02/26/2014  . AAA (abdominal aortic aneurysm) ([place])   . Chest pain, stable coronary arteries at cath, non cardiac pain, resolved 02/24/2014  . Jaw pain 09/25/2013  . Myalgia 09/25/2013  . Gout 08/22/2013  . Abdominal pain, unspecified site 08/13/2013  . Musculoskeletal pain 08/04/2013  . Aftercare following surgery of the circulatory system, [place] 06/25/2013  . Peripheral vascular disease, unspecified ([place]) 06/19/2012  . Dry cough 03/05/2012  . Dysuria 04/21/2011  . Low back pain 10/04/2010  . Acute ischemic colitis ([place]) 07/29/2010  . Lower GI bleed 07/23/2010  . Liver lesion 07/23/2010  . Encounter for well adult exam with abnormal findings 07/22/2010  . URTICARIA 09/23/2009  . TOBACCO ABUSE 11/18/2008  . Obesity 11/17/2008  . MENOPAUSAL DISORDER 10/29/2007  . ANXIETY 06/22/2007  .  DEPRESSION 06/22/2007  . OTITIS MEDIA, SEROUS, CHRONIC 04/23/2007  . CONSTIPATION 02/07/2007  . [place] DISEASE, LUMBOSACRAL SPINE 02/07/2007  . Diabetes ([place]) 11/03/2006  . Allergic rhinitis 11/03/2006  . HYPOTHYROIDISM 07/31/2006  . Hyperlipidemia 07/31/2006  . Essential hypertension 07/31/2006  . Coronary artery disease involving native heart without angina pectoris 07/31/2006    Past Surgical History:  Procedure Laterality Date  . CARDIAC CATHETERIZATION     PCI OF BOTH THE CIRCUMFLEX AND LEFT  ANTERIOR DESCENDING ARTERY  . CARDIAC CATHETERIZATION N/A 10/29/2015   Procedure: Coronary Stent Intervention;  Surgeon: Nelva Bush, MD;  Location: [facility] LAB;  Service: Cardiovascular;  Laterality: N/A;  . CARDIAC CATHETERIZATION N/A 10/29/2015   Procedure: Coronary/Graft Angiography;  Surgeon: Nelva Bush, MD;  Location: [facility] LAB;  Service: Cardiovascular;  Laterality: N/A;  . CARDIAC CATHETERIZATION N/A 10/29/2015   Procedure: Intravascular Pressure Wire/FFR Study;  Surgeon: Nelva Bush, MD;  Location: [facility];  Service: Cardiovascular;  Laterality: N/A;  . CESAREAN SECTION    . ENDOVASCULAR STENT INSERTION N/A 01/22/2016   Procedure: ABDOMINAL AORTIC ENDOVASCULAR STENT GRAFT INSERTION;  Surgeon: Rosetta Posner, MD;  Location: [facility];  Service: Vascular;  Laterality: N/A;  . HEART STENT  04-2010  and  Jun 07, 2013   X 3  . LEFT HEART CATHETERIZATION WITH CORONARY ANGIOGRAM N/A 06/07/2013   Procedure: LEFT HEART CATHETERIZATION WITH CORONARY ANGIOGRAM;  Surgeon: [name], MD;  Location: [facility] C[facility] LAB;  Service: Cardiovascular;  Laterality: N/A;  . LEFT HEART CATHETERIZATION WITH CORONARY ANGIOGRAM N/A 02/25/2014   Procedure: LEFT HEART CATHETERIZATION WITH CORONARY ANGIOGRAM;  Surgeon: Troy Sine, MD;  Location: [facility] CATH LAB;  Service: Cardiovascular;  Laterality: N/A;  . LUMBAR FUSION  01/2007   DR. Patrice Paradise...3-LEVEL WITH FIXATION  . OOPHORECTOMY     BSO? pt.unsure  . PARATHYROIDECTOMY    . SPINE SURGERY    . THYROIDECTOMY    . TOTAL ABDOMINAL HYSTERECTOMY      OB History    Gravida Para Term Preterm AB Living   3 2     1 3    SAB TAB Ectopic Multiple Live Births   1     1         Home Medications    Prior to Admission medications   Medication Sig Start Date End Date Taking? Authorizing Provider  allopurinol (ZYLOPRIM) 300 MG tablet Take 1 tablet (300 mg total) by mouth daily. 04/14/16  Yes Biagio Borg, MD  aspirin 81 MG EC  tablet Take 81 mg by mouth daily.    Yes [provider]  atenolol (TENORMIN) 25 MG tablet TAKE 1 TABLET(25 MG) BY MOUTH TWICE DAILY 06/28/16  Yes Biagio Borg, MD  baclofen (LIORESAL) 10 MG tablet Take 1 tablet (10 mg total) by mouth 2 (two) times daily. 11/09/16  Yes Rosemarie Ax, MD  Carboxymethylcellul-Glycerin ([facility] LUBRICATING/DRY EYE OP) Place 1-2 drops into both eyes 3 (three) times daily as needed (for dry eyes.).   Yes [provider]  clopidogrel (PLAVIX) 75 MG tablet Take 1 tablet (75 mg total) by mouth every evening. 12/19/16  Yes Sherren Mocha, MD  gabapentin (NEURONTIN) 300 MG capsule Take 1-2 capsules (300-600 mg total) by mouth daily as needed (for pain/neuropathy). 1-2 tab by mouth at bedtime 01/22/16  Yes Rhyne, Samantha J, PA-C  metFORMIN (GLUCOPHAGE-XR) 500 MG 24 hr tablet TAKE 4 TABLETS BY MOUTH EVERY MORNING 01/11/17  Yes Biagio Borg, MD  morphine (MS CONTIN) 15 MG 12 hr tablet Take 15 mg by mouth. BID FOR 7 DAYS, THEN 1 QHS FOR 7 DAYS, THEN STOP THIS MEDICATION 02/21/17  Yes [provider]  oxyCODONE (ROXICODONE) 5 MG immediate release tablet Take 1 tablet (5 mg total) by mouth every 6 (six) hours as needed for severe pain. 09/23/16  Yes Biagio Borg, MD  SYNTHROID 150 MCG tablet Take 1 tablet (150 mcg total) by mouth daily. 05/26/16  Yes Biagio Borg, MD  tiZANidine (ZANAFLEX) 4 MG tablet Take 1 tablet (4 mg total) by mouth every 6 (six) hours as needed for muscle spasms. 11/15/16  Yes Biagio Borg, MD  traMADol (ULTRAM) 50 MG tablet Take 50 mg by mouth every 8 (eight) hours as needed (pain).   Yes [provider]  atorvastatin (LIPITOR) 20 MG tablet Take 1 tablet (20 mg total) by mouth daily. Patient not taking: Reported on 02/24/2017 11/15/16 11/15/17  Biagio Borg, MD  azithromycin (ZITHROMAX) 250 MG tablet 2 tabs po qd x 1 day; 1 tablet per day x 4 days; Patient not taking: Reported on 02/24/2017 01/27/17   Marrian Salvage, FNP    famotidine (PEPCID) 20 MG tablet Take 1 tablet (20 mg total) by mouth daily as needed (for acid reflux.). Patient not taking: Reported on 02/24/2017 01/22/16   Gabriel Earing, PA-C  nitrofurantoin (MACRODANTIN) 50 MG capsule Take 1 capsule (50 mg total) by mouth 2 (two) times daily. Patient not taking: Reported on 02/24/2017 11/15/16   Biagio Borg, MD  nitroGLYCERIN (NITROSTAT) 0.4 MG SL tablet Place 1 tablet (0.4 mg total) under the tongue every 5 (five) minutes as needed for chest pain. 11/09/15   Richardson Dopp T, PA-C  Vitamin D, Ergocalciferol, (DRISDOL) 50000 units CAPS capsule Take 1 capsule (50,000 Units total) by mouth every 7 (seven) days. Patient not taking: Reported on 02/24/2017 10/06/15   Lyndal Pulley, DO    Family History Family History  Problem Relation Age of Onset  . Heart attack Mother 83       s/p D&C-CARDIAC ARREST 1966  . Heart disease Mother   . Heart attack Father 61       1978 WITH MI  . Heart disease Father   . Diabetes Brother   . Diabetes Maternal Aunt   . Arthritis Maternal Aunt   . Breast cancer Maternal Aunt        Post menopausal  . Colon cancer Neg Hx     Social History Social History   Tobacco Use  . Smoking status: Former Smoker    Years: 30.00    Types: Cigarettes    Last attempt to quit: 05/31/1986    Years since quitting: 30.7  . Smokeless tobacco: Never Used  Substance Use Topics  . Alcohol use: No  . Drug use: No     Allergies   Prilosec [omeprazole]; Miconazole nitrate; Crestor [rosuvastatin calcium]; Augmentin [amoxicillin-pot clavulanate]; and Doxycycline   Review of Systems Review of Systems  Constitutional: Negative for chills and fever.  Respiratory: Negative for shortness of breath.   Cardiovascular: Negative for chest pain.  Gastrointestinal: Positive for abdominal pain.  Genitourinary: Negative for dysuria.  Musculoskeletal: Negative for back pain and neck pain.     Physical Exam Updated Vital Signs BP 124/64    Pulse 85   Temp (!) 97.5 F (36.4 C) (Oral)   Resp 19   SpO2 100% Comment: Simultaneous filing. User may not have seen previous  data.  Physical Exam  Constitutional: She is oriented to person, place, and time. She appears well-developed and well-nourished. She appears distressed (in pain).  HENT:  Head: Normocephalic. Head is with laceration.  Dry blood in mouth. No active bleeding.   Neck: Full passive range of motion without pain.  Cardiovascular: Normal rate, regular rhythm and normal heart sounds.  Abdominal: Soft. Normal appearance and bowel sounds are normal. There is tenderness in the left upper quadrant and left lower quadrant.  Musculoskeletal: She exhibits no edema.  Neurological: She is alert and oriented to person, place, and time. No cranial nerve deficit or sensory deficit.  Moving all extremities spontaneously.   Skin: Skin is warm and dry.     ED Treatments / Results  Labs (all labs ordered are listed, but only abnormal results are displayed) Labs Reviewed  CBC WITH DIFFERENTIAL/PLATELET - Abnormal; Notable for the following components:      Result Value   WBC 12.7 (*)    Hemoglobin 10.7 (*)    HCT 33.6 (*)    Platelets 577 (*)    Neutro Abs 10.0 (*)    All other components within normal limits  BASIC METABOLIC PANEL - Abnormal; Notable for the following components:   Glucose, Bld 137 (*)    Calcium 8.8 (*)    All other components within normal limits  URINALYSIS, ROUTINE W REFLEX MICROSCOPIC    EKG  EKG Interpretation None       Radiology Ct Abdomen Pelvis Wo Contrast  Result Date: 02/24/2017 CLINICAL DATA:  Surgery yesterday on back, right-sided abdominal pain. Patient unable to lie on back. Patient scanned on side. EXAM: CT ABDOMEN AND PELVIS WITHOUT CONTRAST TECHNIQUE: Multidetector CT imaging of the abdomen and pelvis was performed following the standard protocol without IV contrast. COMPARISON:  CT abdomen dated 11/09/2015. FINDINGS: Lower  chest: Mild atelectasis/fibrosis at the right lung base. Hepatobiliary: Stable cyst within the left hepatic lobe. Liver otherwise unremarkable. Gallbladder appears normal. No bile duct dilatation. Pancreas: Unremarkable. No pancreatic ductal dilatation or surrounding inflammatory changes. Spleen: Normal in size without focal abnormality. Adrenals/Urinary Tract: Adrenal glands appear normal. Kidneys are unremarkable without mass, stone or hydronephrosis. No perinephric inflammation. Bladder appears normal. Stomach/Bowel: Bowel is normal in caliber. No bowel wall thickening or evidence of bowel wall inflammation seen. Fairly extensive diverticulosis throughout the colon but no focal inflammatory change to suggest acute diverticulitis. Appendix is not convincingly seen but there are no inflammatory changes about the cecum to suggest acute appendicitis. Stomach appears normal, decompressed. Vascular/Lymphatic: Interval aorta bi-iliac stent placement. Aneurysm sac is stable or slightly smaller compared to the previous study. No evidence of complicating feature. No acute appearing vascular abnormality. Aortic atherosclerosis. No enlarged lymph nodes seen in the abdomen or pelvis. Reproductive: Status post hysterectomy. No adnexal masses. Other: No free fluid, hemorrhage or abscess collection within the abdomen or pelvis. No retroperitoneal fluid collection, hemorrhage or abscess. No free intraperitoneal air. Musculoskeletal: Expected postsurgical changes within the posterior paraspinous musculature and subcutaneous soft tissues. The fixation hardware has been extended upwards to the level of the T10 vertebral body (previously L3-S1). Hardware appears appropriately positioned at each level. IMPRESSION: 1. Expected postsurgical changes/thickening (presumed edema) within the posterior paraspinous musculature and subcutaneous soft tissues. No evidence of surgical complicating feature. 2. No acute intra-abdominal or  intrapelvic abnormality. No free fluid, hemorrhage or abscess collection. No free intraperitoneal air. No bowel obstruction or evidence of bowel wall inflammation. 3. Colonic diverticulosis without evidence of acute diverticulitis.  4. Aorta bi-iliac stent in place. Aneurysm sac is stable or slightly smaller compared to the previous CT of 11/09/2015. Electronically Signed   By: Franki Cabot M.D.   On: 02/24/2017 15:06    Procedures Procedures (including critical care time)  Medications Ordered in ED Medications  senna-docusate (Senokot-S) tablet 2 tablet (not administered)  LORazepam (ATIVAN) injection 0.5 mg (0.5 mg Intravenous Given 02/24/17 1558)  morphine 2 MG/ML injection 2 mg (2 mg Intravenous Given 02/24/17 1558)     Initial Impression / Assessment and Plan / ED Course  I have reviewed the triage vital signs and the nursing notes.  Pertinent labs & imaging results that were available during my care of the patient were reviewed by me and considered in my medical decision making (see chart for details).   72 yo female with recent T10-sacral posterior fusion revision surgery presenting with left flank pain after falling out of bed. Vital signs stable. Physical exam without visible ecchymosis, but patient has significant tenderness to palpation of LLQ and LUQ abdominal pain. Patient is on plavix, but has not taken in several days.   CT abdomen pelvis is negative for acute intraabdominal or intrapelvic abnormality of the patient's pain, no hemorrhage or retroperitoneal bleed. Patient's aneurysmal sac is stable. Expected post surgical changes within the posterior paraspinous musculature, without evidence of post op complications.   Patients BMET is within normal limits. CBC with mildly elevate white count to 12.7, and hemoglobin at 10.7. Will get UA to rule out UTI. Patient also has not had a bowel movement in 3 days and is on opioids at home, without a bowel regimen. Left flank pain is likely  due to fall, and possibly constipation.   Patient has not urinated. Nursing to encourage urination and has orders to in/out cath if necessary to get urine sample..  Patient signed out to Dr. Eulis Foster.   Final Clinical Impressions(s) / ED Diagnoses   Final diagnoses:  Fall from bed, initial encounter    ED Discharge Orders    None       Melanee Spry, MD 02/24/17 1559    Daleen Bo, MD 03/03/17 1056

## 2017-02-24 NOTE — ED Notes (Signed)
Bed: TC28 Expected date:  Expected time:  Means of arrival:  Comments: Hold-RN off floor

## 2017-02-24 NOTE — ED Provider Notes (Signed)
I saw and evaluated the patient, reviewed the resident's note and I agree with the findings and plan.   EKG Interpretation None     72 year old female who rolled out of bed today and fell onto her left side.  Complains of dull pain is worse with movement at her left upper abdomen as well as her left mid thoracic back.  Did hit her head against an object in her bedroom but denied any loss of consciousness.  Will medicate for pain here and CT is ordered and are pending at this time   Lacretia Leigh, MD 02/24/17 1419

## 2017-02-24 NOTE — ED Notes (Signed)
Patient transported to CT 

## 2017-02-24 NOTE — ED Notes (Signed)
Report given to Michelle, RN. Care transferred at this time.

## 2017-02-24 NOTE — ED Triage Notes (Signed)
Transported by [facility] from home--patient had spinal surgery (completed at [facility]) a few days ago. Experienced a fall today, pt fell out of the bed and pt now reports pain to incision site and left flank. VSS.

## 2017-02-24 NOTE — Discharge Instructions (Addendum)
°  The CT scan of your abdomen and pelvis was normal. There were no signs of serious trauma from your fall.   To treat the abdominal pain which is likely caused from constipation, we are prescribing lactulose.  For now, do not take MiraLAX or Colace.  You may need to restart these, after you take the lactulose for 5 days.  If you have good results from the lactulose in the first couple of days you can lower the dose to 15 cc 3 times a day.  Make sure that you are ingesting a high-fiber diet and drinking plenty of water to improve stooling.

## 2017-02-27 ENCOUNTER — Inpatient Hospital Stay: Payer: Medicare Other | Admitting: Internal Medicine

## 2017-02-27 ENCOUNTER — Telehealth: Payer: Self-pay | Admitting: *Deleted

## 2017-02-27 NOTE — Telephone Encounter (Signed)
Called pt to verify appt that has been made for 03/03/17. Pt is aware she states she made appt this am. Inform need to ask some additional questions concerning discharge. Completed TCM call below.[name]  Transition Care Management Follow-up Telephone Call   Date discharged? 02/25/17   How have you been since you were released from the hospital? Pt states she seem to doing alright.. Just tired!    Do you understand why you were in the hospital? YES   Do you understand the discharge instructions? YES   Where were you discharged to? Home   Items Reviewed:  Medications reviewed: YES  Allergies reviewed: YES  Dietary changes reviewed: NO  Referrals reviewed: No referral needed   Functional Questionnaire:   Activities of Daily Living (ADLs):   She states she are independent in the following: bathing and hygiene, feeding, continence, grooming, toileting and dressing States they require assistance with the following: ambulation   Any transportation issues/concerns?: NO   Any patient concerns? NO   Confirmed importance and date/time of follow-up visits scheduled YES, appt 03/03/17  Provider Appointment booked with Dr. [name]  Confirmed with patient if condition begins to worsen call PCP or go to the ER.  Patient was given the office number and encouraged to call back with question or concerns.  : YES

## 2017-03-03 ENCOUNTER — Inpatient Hospital Stay: Payer: Medicare Other | Admitting: Internal Medicine

## 2017-03-08 ENCOUNTER — Other Ambulatory Visit: Payer: Medicare Other

## 2017-03-09 ENCOUNTER — Other Ambulatory Visit: Payer: Medicare Other

## 2017-03-09 ENCOUNTER — Encounter: Payer: Self-pay | Admitting: Internal Medicine

## 2017-03-09 ENCOUNTER — Ambulatory Visit (INDEPENDENT_AMBULATORY_CARE_PROVIDER_SITE_OTHER): Payer: Medicare Other | Admitting: Internal Medicine

## 2017-03-09 ENCOUNTER — Other Ambulatory Visit (INDEPENDENT_AMBULATORY_CARE_PROVIDER_SITE_OTHER): Payer: Medicare Other

## 2017-03-09 VITALS — BP 124/78 | HR 82 | Temp 97.7°F | Ht 66.0 in | Wt 214.0 lb

## 2017-03-09 DIAGNOSIS — Z0001 Encounter for general adult medical examination with abnormal findings: Secondary | ICD-10-CM

## 2017-03-09 DIAGNOSIS — I10 Essential (primary) hypertension: Secondary | ICD-10-CM

## 2017-03-09 DIAGNOSIS — R300 Dysuria: Secondary | ICD-10-CM

## 2017-03-09 DIAGNOSIS — M545 Low back pain: Secondary | ICD-10-CM

## 2017-03-09 DIAGNOSIS — E11311 Type 2 diabetes mellitus with unspecified diabetic retinopathy with macular edema: Secondary | ICD-10-CM

## 2017-03-09 DIAGNOSIS — K5900 Constipation, unspecified: Secondary | ICD-10-CM

## 2017-03-09 LAB — URINALYSIS, ROUTINE W REFLEX MICROSCOPIC
Nitrite: POSITIVE — AB
Specific Gravity, Urine: 1.025 (ref 1.000–1.030)
URINE GLUCOSE: NEGATIVE
UROBILINOGEN UA: 2 — AB (ref 0.0–1.0)
pH: 6 (ref 5.0–8.0)

## 2017-03-09 LAB — CBC WITH DIFFERENTIAL/PLATELET
BASOS ABS: 0 10*3/uL (ref 0.0–0.1)
Basophils Relative: 0.4 % (ref 0.0–3.0)
EOS ABS: 0.1 10*3/uL (ref 0.0–0.7)
Eosinophils Relative: 1.1 % (ref 0.0–5.0)
HCT: 33.3 % — ABNORMAL LOW (ref 36.0–46.0)
Hemoglobin: 10.5 g/dL — ABNORMAL LOW (ref 12.0–15.0)
LYMPHS ABS: 1.5 10*3/uL (ref 0.7–4.0)
Lymphocytes Relative: 13.5 % (ref 12.0–46.0)
MCHC: 31.5 g/dL (ref 30.0–36.0)
MCV: 81.3 fl (ref 78.0–100.0)
MONO ABS: 0.7 10*3/uL (ref 0.1–1.0)
MONOS PCT: 6.8 % (ref 3.0–12.0)
NEUTROS ABS: 8.6 10*3/uL — AB (ref 1.4–7.7)
NEUTROS PCT: 78.2 % — AB (ref 43.0–77.0)
PLATELETS: 488 10*3/uL — AB (ref 150.0–400.0)
RBC: 4.1 Mil/uL (ref 3.87–5.11)
RDW: 16.2 % — ABNORMAL HIGH (ref 11.5–15.5)
WBC: 11 10*3/uL — ABNORMAL HIGH (ref 4.0–10.5)

## 2017-03-09 LAB — LIPID PANEL
CHOL/HDL RATIO: 5
Cholesterol: 138 mg/dL (ref 0–200)
HDL: 28.3 mg/dL — AB (ref >39.00)
LDL CALC: 84 mg/dL (ref 0–99)
NonHDL: 110.19
TRIGLYCERIDES: 131 mg/dL (ref 0.0–149.0)
VLDL: 26.2 mg/dL (ref 0.0–40.0)

## 2017-03-09 LAB — MICROALBUMIN / CREATININE URINE RATIO
Creatinine,U: 259.5 mg/dL
MICROALB UR: 11.8 mg/dL — AB (ref 0.0–1.9)
Microalb Creat Ratio: 4.5 mg/g (ref 0.0–30.0)

## 2017-03-09 LAB — BASIC METABOLIC PANEL
BUN: 10 mg/dL (ref 6–23)
CO2: 28 meq/L (ref 19–32)
CREATININE: 1.01 mg/dL (ref 0.40–1.20)
Calcium: 9.1 mg/dL (ref 8.4–10.5)
Chloride: 101 mEq/L (ref 96–112)
GFR: 69.38 mL/min (ref >60.00)
Glucose, Bld: 135 mg/dL — ABNORMAL HIGH (ref 70–99)
Potassium: 3.6 mEq/L (ref 3.5–5.1)
Sodium: 139 mEq/L (ref 135–145)

## 2017-03-09 LAB — HEPATIC FUNCTION PANEL
ALBUMIN: 3.6 g/dL (ref 3.5–5.2)
ALK PHOS: 127 U/L — AB (ref 39–117)
ALT: 10 U/L (ref 0–35)
AST: 15 U/L (ref 0–37)
Bilirubin, Direct: 0.1 mg/dL (ref 0.0–0.3)
Total Bilirubin: 0.4 mg/dL (ref 0.2–1.2)
Total Protein: 7.9 g/dL (ref 6.0–8.3)

## 2017-03-09 LAB — TSH: TSH: 0.51 u[IU]/mL (ref 0.35–4.50)

## 2017-03-09 LAB — HEMOGLOBIN A1C: Hgb A1c MFr Bld: 5.9 % (ref 4.6–6.5)

## 2017-03-09 MED ORDER — LEVOFLOXACIN 500 MG PO TABS
500.0000 mg | ORAL_TABLET | Freq: Every day | ORAL | 0 refills | Status: DC
Start: 2017-03-09 — End: 2017-03-12

## 2017-03-09 MED ORDER — LACTULOSE 10 GM/15ML PO SOLN: 30.0000 g | Freq: Two times a day (BID) | ORAL | 2 refills | Status: DC | PRN

## 2017-03-09 MED ORDER — KETOROLAC TROMETHAMINE 30 MG/ML IJ SOLN
30.0000 mg | Freq: Once | INTRAMUSCULAR | Status: AC
  Administered 2017-03-09: 30 mg via INTRAMUSCULAR

## 2017-03-09 NOTE — Assessment & Plan Note (Signed)
stable overall by history and exam, recent data reviewed with pt, and pt to continue medical treatment as before,  to f/u any worsening symptoms or concerns Lab Results  Component Value Date   HGBA1C 6.4 11/15/2016

## 2017-03-09 NOTE — Assessment & Plan Note (Signed)
D/w pt, to start lactulose bid prn,  to f/u any worsening symptoms or concerns

## 2017-03-09 NOTE — Assessment & Plan Note (Signed)
stable overall by history and exam, recent data reviewed with pt, and pt to continue medical treatment as before,  to f/u any worsening symptoms or concerns BP Readings from Last 3 Encounters:  03/09/17 124/78  02/25/17 124/81  01/27/17 110/70

## 2017-03-09 NOTE — Patient Instructions (Addendum)
Please take all new medication as prescribed - the lactulose for constipation, and the antibiotic   You had the pain shot today (toradol)  Please continue all other medications as before, and refills have been done if requested.  Please have the pharmacy call with any other refills you may need.  Please continue your efforts at being more active, low cholesterol diet, and weight control.  You are otherwise up to date with prevention measures today.  Please keep your appointments with your specialists as you may have planned  Please go to the LAB in the Basement (turn left off the elevator) for the tests to be done today  You will be contacted by phone if any changes need to be made immediately.  Otherwise, you will receive a letter about your results with an explanation, but please check with MyChart first.  Please remember to sign up for MyChart if you have not done so, as this will be important to you in the future with finding out test results, communicating by private email, and scheduling acute appointments online when needed.  Please return in 6 months, or sooner if needed, with Lab testing done 3-5 days before

## 2017-03-09 NOTE — Progress Notes (Signed)
Subjective:    Patient ID: [name], female    DOB: 07/03/1945, 72 y.o.   MRN: [id number]  HPI  Here for wellness and f/u;  Overall doing ok;  Pt denies Chest pain, worsening SOB, DOE, wheezing, orthopnea, PND, worsening LE edema, palpitations, dizziness or syncope.  Pt denies neurological change such as new headache, facial or extremity weakness.  Pt denies polydipsia, polyuria, or low sugar symptoms. Pt states overall good compliance with treatment and medications, good tolerability, and has been trying to follow appropriate diet.  Pt denies worsening depressive symptoms, suicidal ideation or panic. No fever, night sweats, wt loss, loss of appetite, or other constitutional symptoms.  Pt states good ability with ADL's, has low fall risk, home safety reviewed and adequate, no other significant changes in hearing or vision.   Today c/o LBP, using walker but appears in pain, and generally weak, assited by staff today in wheelchair about the office.  Pt is S/p recent lumbar surgury per Dr Patrice Paradise x 3 wks, but pt continues to have recurring LBP without change in severity, bowel or bladder change, fever, wt loss,  worsening LE pain/numbness/weakness, gait change or falls, has f/u with ortho with feb 26.  Had missed 3 appt here recently due to pain but made it today.  Wearing back brace today.  Also incidentally with 3 days onset late urination burning, similar but not as severe as last UTI oct 2018.  Denies urinary symptoms such as frequency, urgency, flank pain, hematuria or n/v, fever, chills. Also seen in ED Feb 8 after unfortunate postop fall OOB, but did not show acute issues related to trauma, and UA neg.  Felt to likely have constipation and documented to start lactulose, but pt never started the lactulose for constipation, last BM several days ago.  Is on recent narcotic postop as well, with reduced mobility. Denies worsening reflux, dysphagia, or blood. Still has similar abd pain, maybe worse with  probable UTI but also with persistent constipation.   Past Medical History:  Diagnosis Date  . AAA (abdominal aortic aneurysm) ([place])    a. s/p stent graft repair 01/2016.  [name] Allergic rhinitis, cause unspecified   . Anxiety state, unspecified   . Chronic LBP   . Coronary artery disease    a. inferior MI 1998 s/p PCI of RCA. b. stenting of Cx 04/2010. c. DES to prox LAD 05/2013. d. DES x 3 in 10/2015.  [name] Degeneration of lumbar or lumbosacral intervertebral disc   . Depressive disorder, not elsewhere classified   . Diabetes mellitus    TYPE II  . Gout 08/22/2013  . Hyperlipidemia   . Hypertension   . Hypothyroidism   . Lower GI bleed 06/2010   Diverticular bleed  . Noncompliance with medications 02/26/2014  . Obesity, unspecified    Past Surgical History:  Procedure Laterality Date  . CARDIAC CATHETERIZATION     PCI OF BOTH THE CIRCUMFLEX AND LEFT ANTERIOR DESCENDING ARTERY  . CARDIAC CATHETERIZATION N/A 10/29/2015   Procedure: Coronary Stent Intervention;  Surgeon: Nelva Bush, MD;  Location: [facility] [facility];  Service: Cardiovascular;  Laterality: N/A;  . CARDIAC CATHETERIZATION N/A 10/29/2015   Procedure: Coronary/Graft Angiography;  Surgeon: Nelva Bush, MD;  Location: [facility];  Service: Cardiovascular;  Laterality: N/A;  . CARDIAC CATHETERIZATION N/A 10/29/2015   Procedure: Intravascular Pressure Wire/FFR Study;  Surgeon: Nelva Bush, MD;  Location: [place] [facility];  Service: Cardiovascular;  Laterality: N/A;  . CESAREAN SECTION    .  ENDOVASCULAR STENT INSERTION N/A 01/22/2016   Procedure: ABDOMINAL AORTIC ENDOVASCULAR STENT GRAFT INSERTION;  Surgeon: Rosetta Posner, MD;  Location: [facility];  Service: Vascular;  Laterality: N/A;  . HEART STENT  04-2010  and  Jun 07, 2013   X 3  . LEFT HEART CATHETERIZATION WITH CORONARY ANGIOGRAM N/A 06/07/2013   Procedure: LEFT HEART CATHETERIZATION WITH CORONARY ANGIOGRAM;  Surgeon: [name], MD;  Location: [facility] C[facility]  LAB;  Service: Cardiovascular;  Laterality: N/A;  . LEFT HEART CATHETERIZATION WITH CORONARY ANGIOGRAM N/A 02/25/2014   Procedure: LEFT HEART CATHETERIZATION WITH CORONARY ANGIOGRAM;  Surgeon: Troy Sine, MD;  Location: [facility] C[facility] LAB;  Service: Cardiovascular;  Laterality: N/A;  . LUMBAR FUSION  01/2007   DR. Patrice Paradise...3-LEVEL WITH FIXATION  . OOPHORECTOMY     BSO? pt.unsure  . PARATHYROIDECTOMY    . SPINE SURGERY    . THYROIDECTOMY    . TOTAL ABDOMINAL HYSTERECTOMY      reports that she quit smoking about 30 years ago. Her smoking use included cigarettes. She quit after 30.00 years of use. she has never used smokeless tobacco. She reports that she does not drink alcohol or use drugs. family history includes Arthritis in her maternal aunt; Breast cancer in her maternal aunt; Diabetes in her brother and maternal aunt; Heart attack (age of onset: 28) in her mother; Heart attack (age of onset: 75) in her father; Heart disease in her father and mother. Allergies  Allergen Reactions  . Prilosec [Omeprazole] Other (See Comments)    Chest pain  . Miconazole Nitrate Hives    REACTION: hives  . Crestor [Rosuvastatin Calcium]     Feeling poor  . Augmentin [Amoxicillin-Pot Clavulanate] Hives, Itching and Rash    Has patient had a PCN reaction causing immediate rash, facial/tongue/throat swelling, SOB or lightheadedness with hypotension:unsure Has patient had a PCN reaction causing severe rash involving mucus membranes or skin necrosis:unsure Has patient had a PCN reaction that required hospitalization:No Has patient had a PCN reaction occurring within the last 10 years:NO If all of the above answers are "NO", then may proceed with Cephalosporin use. Has patient had a PCN reaction causing immediate rash, facial/tongue/throat swelling, SOB or lightheadedness with hypotension:unsure Has patient had a PCN reaction causing severe rash involving mucus membranes or skin necrosis:unsure Has patient had a PCN  reaction that required hospitalization:No Has patient had a PCN reaction occurring within the last 10 years:NO If all of the above answers are "NO", then may proceed with Cephalosporin use.   [name] Doxycycline Other (See Comments)    REACTION: gi upset   Current Outpatient Medications on File Prior to Visit  Medication Sig Dispense Refill  . allopurinol (ZYLOPRIM) 300 MG tablet Take 1 tablet (300 mg total) by mouth daily. 90 tablet 3  . aspirin 81 MG EC tablet Take 81 mg by mouth daily.     [name] atenolol (TENORMIN) 25 MG tablet TAKE 1 TABLET(25 MG) BY MOUTH TWICE DAILY 180 tablet 3  . atorvastatin (LIPITOR) 20 MG tablet Take 1 tablet (20 mg total) by mouth daily. 90 tablet 3  . azithromycin (ZITHROMAX) 250 MG tablet 2 tabs po qd x 1 day; 1 tablet per day x 4 days; 6 tablet 0  . baclofen (LIORESAL) 10 MG tablet Take 1 tablet (10 mg total) by mouth 2 (two) times daily. 30 each 0  . Carboxymethylcellul-Glycerin ([employer] LUBRICATING/DRY EYE OP) Place 1-2 drops into both eyes 3 (three) times daily as needed (for dry eyes.).    [name]  clopidogrel (PLAVIX) 75 MG tablet Take 1 tablet (75 mg total) by mouth every evening. 90 tablet 3  . famotidine (PEPCID) 20 MG tablet Take 1 tablet (20 mg total) by mouth daily as needed (for acid reflux.).    [name] gabapentin (NEURONTIN) 300 MG capsule Take 1-2 capsules (300-600 mg total) by mouth daily as needed (for pain/neuropathy). 1-2 tab by mouth at bedtime    . metFORMIN (GLUCOPHAGE-XR) 500 MG 24 hr tablet TAKE 4 TABLETS BY MOUTH EVERY MORNING 360 tablet 3  . morphine (MS CONTIN) 15 MG 12 hr tablet Take 15 mg by mouth. BID FOR 7 DAYS, THEN 1 QHS FOR 7 DAYS, THEN STOP THIS MEDICATION    . nitrofurantoin (MACRODANTIN) 50 MG capsule Take 1 capsule (50 mg total) by mouth 2 (two) times daily. 20 capsule 0  . nitroGLYCERIN (NITROSTAT) 0.4 MG SL tablet Place 1 tablet (0.4 mg total) under the tongue every 5 (five) minutes as needed for chest pain. 25 tablet 3  . oxyCODONE (ROXICODONE) 5  MG immediate release tablet Take 1 tablet (5 mg total) by mouth every 6 (six) hours as needed for severe pain. 30 tablet 0  . SYNTHROID 150 MCG tablet Take 1 tablet (150 mcg total) by mouth daily. 90 tablet 2  . tiZANidine (ZANAFLEX) 4 MG tablet Take 1 tablet (4 mg total) by mouth every 6 (six) hours as needed for muscle spasms. 60 tablet 1  . traMADol (ULTRAM) 50 MG tablet Take 50 mg by mouth every 8 (eight) hours as needed (pain).    . Vitamin D, Ergocalciferol, (DRISDOL) 50000 units CAPS capsule Take 1 capsule (50,000 Units total) by mouth every 7 (seven) days. 12 capsule 0   No current facility-administered medications on file prior to visit.    Review of Systems Constitutional: Negative for other unusual diaphoresis, sweats, appetite or weight changes HENT: Negative for other worsening hearing loss, ear pain, facial swelling, mouth sores or neck stiffness.   Eyes: Negative for other worsening pain, redness or other visual disturbance.  Respiratory: Negative for other stridor or swelling Cardiovascular: Negative for other palpitations or other chest pain  Gastrointestinal: Negative for worsening diarrhea or loose stools, blood in stool, distention or other pain Genitourinary: Negative for hematuria, flank pain or other change in urine volume.  Musculoskeletal: Negative for myalgias or other joint swelling.  Skin: Negative for other color change, or other wound or worsening drainage.  Neurological: Negative for other syncope or numbness. Hematological: Negative for other adenopathy or swelling Psychiatric/Behavioral: Negative for hallucinations, other worsening agitation, SI, self-injury, or new decreased concentration All other system neg per pt    Objective:   Physical Exam BP 124/78   Pulse 82   Temp 97.7 F (36.5 C) (Oral)   Ht 5\' 6"  (1.676 m)   Wt 214 lb (97.1 kg)   SpO2 97%   BMI 34.54 kg/m  VS noted, appears in pain and hard to sit in any one position, general weakness,  walks with walker but unable for up on exam table Constitutional: Pt is oriented to person, place, and time. Appears well-developed and well-nourished, in no significant distress and comfortable Head: Normocephalic and atraumatic  Eyes: Conjunctivae and EOM are normal. Pupils are equal, round, and reactive to light Right Ear: External ear normal without discharge Left Ear: External ear normal without discharge Nose: Nose without discharge or deformity Mouth/Throat: Oropharynx is without other ulcerations and moist  Neck: Normal range of motion. Neck supple. No JVD present. No tracheal  deviation present or significant neck LA or mass Cardiovascular: Normal rate, regular rhythm, normal heart sounds and intact distal pulses.   Pulmonary/Chest: WOB normal and breath sounds without rales or wheezing  Abdominal: Soft. Bowel sounds are normal. NT. No HSM  Musculoskeletal: Normal range of motion. Exhibits no edema, diffuse lumbar tender without swelling or drainage Lymphadenopathy: Has no other cervical adenopathy.  Neurological: Pt is alert and oriented to person, place, and time. Pt has normal reflexes. No cranial nerve deficit. Motor grossly intact, Gait intact Skin: Skin is warm and dry. No rash noted or new ulcerations Psychiatric:  Has nervous mood and affect. Behavior is normal without agitation No other exam findings  Lab Results  Component Value Date   WBC 12.7 (H) 02/24/2017   HGB 10.7 (L) 02/24/2017   HCT 33.6 (L) 02/24/2017   PLT 577 (H) 02/24/2017   GLUCOSE 137 (H) 02/24/2017   CHOL 181 11/30/2016   TRIG 145 11/30/2016   HDL 29 (L) 11/30/2016   LDLDIRECT 131.0 03/27/2015   LDLCALC 123 (H) 11/30/2016   ALT 18 11/30/2016   AST 21 11/30/2016   NA 140 02/24/2017   K 4.1 02/24/2017   CL 104 02/24/2017   CREATININE 0.92 02/24/2017   BUN 11 02/24/2017   CO2 27 02/24/2017   TSH 0.89 03/23/2016   INR 1.08 01/22/2016   HGBA1C 6.4 11/15/2016   MICROALBUR 3.5 (H) 03/23/2016          Assessment & Plan:

## 2017-03-09 NOTE — Assessment & Plan Note (Signed)

## 2017-03-09 NOTE — Assessment & Plan Note (Signed)
Exam benign but suspect likely recurrent UTI, and may be making the LBP worse in addition to the constipation

## 2017-03-09 NOTE — Assessment & Plan Note (Addendum)
Postop no fever or neuro changes, ok for toradol 30 mg IM x 1 today, cont tx o/w per ortho  In addition to the time spent performing CPE, I spent an additional 25 minutes face to face,in which greater than 50% of this time was spent in counseling and coordination of care for patient's acute illness as documented, including the differential dx, treatment, further evaluation and other management of acute on chronic and postop low back pain, dysuria, constipation, DM and HTN

## 2017-03-10 ENCOUNTER — Inpatient Hospital Stay: Payer: Medicare Other | Admitting: Internal Medicine

## 2017-03-10 ENCOUNTER — Ambulatory Visit: Payer: Medicare Other | Admitting: Cardiovascular Disease

## 2017-03-11 LAB — URINE CULTURE
MICRO NUMBER:: 90230260
SPECIMEN QUALITY:: ADEQUATE

## 2017-03-12 ENCOUNTER — Other Ambulatory Visit: Payer: Self-pay | Admitting: Internal Medicine

## 2017-03-12 MED ORDER — CIPROFLOXACIN HCL 500 MG PO TABS: 500.0000 mg | ORAL_TABLET | Freq: Two times a day (BID) | ORAL | 0 refills | Status: AC

## 2017-03-13 ENCOUNTER — Ambulatory Visit: Payer: Medicare Other | Admitting: Cardiovascular Disease

## 2017-04-14 ENCOUNTER — Ambulatory Visit: Payer: Medicare Other | Admitting: Cardiovascular Disease

## 2017-04-22 ENCOUNTER — Other Ambulatory Visit: Payer: Self-pay | Admitting: Internal Medicine

## 2017-04-24 ENCOUNTER — Telehealth: Payer: Self-pay | Admitting: Cardiovascular Disease

## 2017-04-24 NOTE — Telephone Encounter (Signed)
Scheduled patient 5/30 at 1040. She was grateful for assistance.

## 2017-04-24 NOTE — Telephone Encounter (Signed)
°  New message  Pt verbalized that she is calling for RN  She wants her past appt rescheduled   As soon as next week with Dr.Cooper

## 2017-04-25 ENCOUNTER — Telehealth: Payer: Self-pay | Admitting: Cardiovascular Disease

## 2017-04-25 NOTE — Telephone Encounter (Signed)
Request for surgical clearance:  1. What type of surgery is being performed?  Revision T4-S1 PSF   2. When is this surgery scheduled?  TBD   3. Are there any medications that need to be held prior to surgery and how long? Plavix x 7 days   4. Name of physician performing surgery? Dr. Rennis Harding   5. What is your office phone and fax number?  [phone] Fax [phone] ATTN [facility] 6. Anesthesia- General

## 2017-04-30 NOTE — Telephone Encounter (Signed)
Yes. She is clear to hold plavix x 5 days. thanks

## 2017-05-01 ENCOUNTER — Other Ambulatory Visit: Payer: Self-pay | Admitting: Internal Medicine

## 2017-05-01 NOTE — Telephone Encounter (Signed)
   Primary Cardiologist: Sherren Mocha, MD  Chart reviewed as part of pre-operative protocol coverage. Patient was contacted 05/01/2017 in reference to pre-operative risk assessment for pending surgery as outlined below.  [name] was last seen on 12/06/2016 by Melina Copa, PA.  Since that day, [name] has done well per phone call by Doreene Adas, PA.  Therefore, based on ACC/AHA guidelines, the patient would be at acceptable risk for the planned procedure without further cardiovascular testing.   She may hold Plavix for 5 days pre-operatively and resume postoperatively when safe.   I will route this recommendation to the requesting party via Epic fax function and remove from pre-op pool.  Please call with questions.  Daune Perch, NP 05/01/2017, 4:45 PM

## 2017-05-29 ENCOUNTER — Encounter: Payer: Self-pay | Admitting: Internal Medicine

## 2017-05-29 ENCOUNTER — Ambulatory Visit (INDEPENDENT_AMBULATORY_CARE_PROVIDER_SITE_OTHER): Payer: Medicare Other | Admitting: Internal Medicine

## 2017-05-29 ENCOUNTER — Ambulatory Visit: Payer: Self-pay | Admitting: *Deleted

## 2017-05-29 ENCOUNTER — Other Ambulatory Visit (INDEPENDENT_AMBULATORY_CARE_PROVIDER_SITE_OTHER): Payer: Medicare Other

## 2017-05-29 VITALS — BP 128/86 | HR 83 | Temp 98.3°F | Ht 66.0 in | Wt 212.0 lb

## 2017-05-29 DIAGNOSIS — G8929 Other chronic pain: Secondary | ICD-10-CM | POA: Diagnosis not present

## 2017-05-29 DIAGNOSIS — R300 Dysuria: Secondary | ICD-10-CM | POA: Diagnosis not present

## 2017-05-29 DIAGNOSIS — M7989 Other specified soft tissue disorders: Secondary | ICD-10-CM

## 2017-05-29 DIAGNOSIS — M545 Low back pain: Secondary | ICD-10-CM | POA: Diagnosis not present

## 2017-05-29 DIAGNOSIS — J309 Allergic rhinitis, unspecified: Secondary | ICD-10-CM | POA: Diagnosis not present

## 2017-05-29 LAB — URINALYSIS, ROUTINE W REFLEX MICROSCOPIC
Bilirubin Urine: NEGATIVE
Hgb urine dipstick: NEGATIVE
KETONES UR: NEGATIVE
LEUKOCYTES UA: NEGATIVE
Nitrite: POSITIVE — AB
RBC / HPF: NONE SEEN (ref 0–?)
SPECIFIC GRAVITY, URINE: 1.02 (ref 1.000–1.030)
Total Protein, Urine: NEGATIVE
Urine Glucose: NEGATIVE
Urobilinogen, UA: 0.2 (ref 0.0–1.0)
pH: 5.5 (ref 5.0–8.0)

## 2017-05-29 MED ORDER — METHYLPREDNISOLONE ACETATE 80 MG/ML IJ SUSP
80.0000 mg | Freq: Once | INTRAMUSCULAR | Status: AC
  Administered 2017-05-29: 80 mg via INTRAMUSCULAR

## 2017-05-29 MED ORDER — FUROSEMIDE 20 MG PO TABS: 20.0000 mg | ORAL_TABLET | Freq: Every day | ORAL | 3 refills | Status: DC | PRN

## 2017-05-29 MED ORDER — KETOROLAC TROMETHAMINE 30 MG/ML IJ SOLN
30.0000 mg | Freq: Once | INTRAMUSCULAR | Status: AC
  Administered 2017-05-29: 30 mg via INTRAMUSCULAR

## 2017-05-29 NOTE — Patient Instructions (Addendum)
You had the steroid shot today, and the pain shot (toradol) today  Please take all new medication as prescribed - the lasix low dose 20 mg per day only as needed for feet swelling  Please continue all other medications as before, and refills have been done if requested.  Please have the pharmacy call with any other refills you may need.  Please continue your efforts at being more active, low cholesterol diet, and weight control.  Please keep your appointments with your specialists as you may have planned  Please go to the LAB in the Basement (turn left off the elevator) for the tests to be done today - just the urine testing today4  You will be contacted by phone if any changes need to be made immediately.  Otherwise, you will receive a letter about your results with an explanation, but please check with MyChart first.  Please remember to sign up for MyChart if you have not done so, as this will be important to you in the future with finding out test results, communicating by private email, and scheduling acute appointments online when needed.  [name] with your Back Surgury next week!

## 2017-05-29 NOTE — Progress Notes (Signed)
Subjective:    Patient ID: [name], female    DOB: September 19, 1945, 72 y.o.   MRN: [id number]  HPI  Here to f/u; overall doing ok,  Pt denies chest pain, increasing sob or doe, wheezing, orthopnea, PND, palpitations, dizziness or syncope, but had 2 wks onset bipedal edema without pain but hard to get shoes on, gets better at night, but back the next day.  Pt denies new neurological symptoms such as new headache, or facial or extremity weakness or numbness.  Pt denies polydipsia, polyuria, or low sugar episode.  Pt states overall good compliance with meds, mostly trying to follow appropriate diet, with wt overall stable,  but little exercise however, as she has ongoing polyarthralgai, asks for toradol IM today repeat.  Pt continues to have recurring LBP without change in severity, bowel or bladder change, fever, wt loss,  worsening LE pain/numbness/weakness, gait change or falls.  Also, has had very mild 2 days urinary symptoms such as dysuria, but no frequency, urgency, flank pain, hematuria or n/v, fever, chills.  Does have several wks ongoing nasal allergy symptoms with clearish congestion, itch and sneezing, without fever, pain, ST, cough, swelling or wheezing. Due for [facility] Spine surgury next wk Past Medical History:  Diagnosis Date  . AAA (abdominal aortic aneurysm) ([place])    a. s/p stent graft repair 01/2016.  [name] Allergic rhinitis, cause unspecified   . Anxiety state, unspecified   . Chronic LBP   . Coronary artery disease    a. inferior MI 1998 s/p PCI of RCA. b. stenting of Cx 04/2010. c. DES to prox LAD 05/2013. d. DES x 3 in 10/2015.  [name] Degeneration of lumbar or lumbosacral intervertebral disc   . Depressive disorder, not elsewhere classified   . Diabetes mellitus    TYPE II  . Gout 08/22/2013  . Hyperlipidemia   . Hypertension   . Hypothyroidism   . Lower GI bleed 06/2010   Diverticular bleed  . Noncompliance with medications 02/26/2014  . Obesity, unspecified    Past Surgical History:    Procedure Laterality Date  . CARDIAC CATHETERIZATION     PCI OF BOTH THE CIRCUMFLEX AND LEFT ANTERIOR DESCENDING ARTERY  . CARDIAC CATHETERIZATION N/A 10/29/2015   Procedure: Coronary Stent Intervention;  Surgeon: Nelva Bush, MD;  Location: [facility];  Service: Cardiovascular;  Laterality: N/A;  . CARDIAC CATHETERIZATION N/A 10/29/2015   Procedure: Coronary/Graft Angiography;  Surgeon: Nelva Bush, MD;  Location: [facility];  Service: Cardiovascular;  Laterality: N/A;  . CARDIAC CATHETERIZATION N/A 10/29/2015   Procedure: Intravascular Pressure Wire/FFR Study;  Surgeon: Nelva Bush, MD;  Location: [facility];  Service: Cardiovascular;  Laterality: N/A;  . CESAREAN SECTION    . ENDOVASCULAR STENT INSERTION N/A 01/22/2016   Procedure: ABDOMINAL AORTIC ENDOVASCULAR STENT GRAFT INSERTION;  Surgeon: Rosetta Posner, MD;  Location: [facility];  Service: Vascular;  Laterality: N/A;  . HEART STENT  04-2010  and  Jun 07, 2013   X 3  . LEFT HEART CATHETERIZATION WITH CORONARY ANGIOGRAM N/A 06/07/2013   Procedure: LEFT HEART CATHETERIZATION WITH CORONARY ANGIOGRAM;  Surgeon: [name], MD;  Location: [facility];  Service: Cardiovascular;  Laterality: N/A;  . LEFT HEART CATHETERIZATION WITH CORONARY ANGIOGRAM N/A 02/25/2014   Procedure: LEFT HEART CATHETERIZATION WITH CORONARY ANGIOGRAM;  Surgeon: Troy Sine, MD;  Location: [facility] CATH LAB;  Service: Cardiovascular;  Laterality: N/A;  . LUMBAR FUSION  01/2007   DR. Patrice Paradise...3-LEVEL  WITH FIXATION  . OOPHORECTOMY     BSO? pt.unsure  . PARATHYROIDECTOMY    . SPINE SURGERY    . THYROIDECTOMY    . TOTAL ABDOMINAL HYSTERECTOMY      reports that she quit smoking about 31 years ago. Her smoking use included cigarettes. She quit after 30.00 years of use. She has never used smokeless tobacco. She reports that she does not drink alcohol or use drugs. family history includes Arthritis in her maternal aunt; Breast cancer in  her maternal aunt; Diabetes in her brother and maternal aunt; Heart attack (age of onset: 77) in her mother; Heart attack (age of onset: 68) in her father; Heart disease in her father and mother. Allergies  Allergen Reactions  . Prilosec [Omeprazole] Other (See Comments)    Chest pain  . Miconazole Nitrate Hives    REACTION: hives  . Crestor [Rosuvastatin Calcium]     Feeling poor  . Augmentin [Amoxicillin-Pot Clavulanate] Hives, Itching and Rash    Has patient had a PCN reaction causing immediate rash, facial/tongue/throat swelling, SOB or lightheadedness with hypotension:unsure Has patient had a PCN reaction causing severe rash involving mucus membranes or skin necrosis:unsure Has patient had a PCN reaction that required hospitalization:No Has patient had a PCN reaction occurring within the last 10 years:NO If all of the above answers are "NO", then may proceed with Cephalosporin use. Has patient had a PCN reaction causing immediate rash, facial/tongue/throat swelling, SOB or lightheadedness with hypotension:unsure Has patient had a PCN reaction causing severe rash involving mucus membranes or skin necrosis:unsure Has patient had a PCN reaction that required hospitalization:No Has patient had a PCN reaction occurring within the last 10 years:NO If all of the above answers are "NO", then may proceed with Cephalosporin use.   [name] Doxycycline Other (See Comments)    REACTION: gi upset   Current Outpatient Medications on File Prior to Visit  Medication Sig Dispense Refill  . allopurinol (ZYLOPRIM) 300 MG tablet Take 1 tablet (300 mg total) by mouth daily. 90 tablet 3  . aspirin 81 MG EC tablet Take 81 mg by mouth daily.     [name] atenolol (TENORMIN) 25 MG tablet TAKE 1 TABLET(25 MG) BY MOUTH TWICE DAILY 180 tablet 0  . atorvastatin (LIPITOR) 20 MG tablet Take 1 tablet (20 mg total) by mouth daily. 90 tablet 3  . baclofen (LIORESAL) 10 MG tablet Take 1 tablet (10 mg total) by mouth 2 (two) times  daily. 30 each 0  . Carboxymethylcellul-Glycerin ([employer] LUBRICATING/DRY EYE OP) Place 1-2 drops into both eyes 3 (three) times daily as needed (for dry eyes.).    [name] clopidogrel (PLAVIX) 75 MG tablet Take 1 tablet (75 mg total) by mouth every evening. 90 tablet 3  . famotidine (PEPCID) 20 MG tablet Take 1 tablet (20 mg total) by mouth daily as needed (for acid reflux.).    [name] gabapentin (NEURONTIN) 300 MG capsule Take 1-2 capsules (300-600 mg total) by mouth daily as needed (for pain/neuropathy). 1-2 tab by mouth at bedtime    . lactulose (CHRONULAC) 10 GM/15ML solution Take 45 mLs (30 g total) by mouth 2 (two) times daily as needed for mild constipation. 946 mL 2  . metFORMIN (GLUCOPHAGE-XR) 500 MG 24 hr tablet TAKE 4 TABLETS BY MOUTH EVERY MORNING 360 tablet 3  . morphine (MS CONTIN) 15 MG 12 hr tablet Take 15 mg by mouth. BID FOR 7 DAYS, THEN 1 QHS FOR 7 DAYS, THEN STOP THIS MEDICATION    .  nitroGLYCERIN (NITROSTAT) 0.4 MG SL tablet Place 1 tablet (0.4 mg total) under the tongue every 5 (five) minutes as needed for chest pain. 25 tablet 3  . oxyCODONE (ROXICODONE) 5 MG immediate release tablet Take 1 tablet (5 mg total) by mouth every 6 (six) hours as needed for severe pain. 30 tablet 0  . SYNTHROID 150 MCG tablet TAKE 1 TABLET(150 MCG) BY MOUTH DAILY 90 tablet 0  . tiZANidine (ZANAFLEX) 4 MG tablet Take 1 tablet (4 mg total) by mouth every 6 (six) hours as needed for muscle spasms. 60 tablet 1  . traMADol (ULTRAM) 50 MG tablet Take 50 mg by mouth every 8 (eight) hours as needed (pain).    . Vitamin D, Ergocalciferol, (DRISDOL) 50000 units CAPS capsule Take 1 capsule (50,000 Units total) by mouth every 7 (seven) days. 12 capsule 0   No current facility-administered medications on file prior to visit.    Review of Systems  Constitutional: Negative for other unusual diaphoresis or sweats HENT: Negative for ear discharge or swelling Eyes: Negative for other worsening visual disturbances Respiratory:  Negative for stridor or other swelling  Gastrointestinal: Negative for worsening distension or other blood Genitourinary: Negative for retention or other urinary change Musculoskeletal: Negative for other MSK pain or swelling Skin: Negative for color change or other new lesions Neurological: Negative for worsening tremors and other numbness  Psychiatric/Behavioral: Negative for worsening agitation or other fatigue All other system neg per pt    Objective:   Physical Exam BP 128/86   Pulse 83   Temp 98.3 F (36.8 C) (Oral)   Ht 5\' 6"  (1.676 m)   Wt 212 lb (96.2 kg)   SpO2 96%   BMI 34.22 kg/m  VS noted,  Constitutional: Pt appears in NAD HENT: Head: NCAT.  Right Ear: External ear normal.  Left Ear: External ear normal.  Bilat tm's with mild erythema.  Max sinus areas mild tender.  Pharynx with mild erythema, no exudate Eyes: . Pupils are equal, round, and reactive to light. Conjunctivae and EOM are normal Nose: without d/c or deformity Neck: Neck supple. Gross normal ROM Cardiovascular: Normal rate and regular rhythm.   Pulmonary/Chest: Effort normal and breath sounds without rales or wheezing.  Abd:  Soft, NT, ND, + BS, no organomegaly Neurological: Pt is alert. At baseline orientation, motor grossly intact Skin: Skin is warm. No rashes, other new lesions, trace to 1+ bilat pedal edema Psychiatric: Pt behavior is normal without agitation  No other exam findings     Assessment & Plan:

## 2017-05-29 NOTE — Telephone Encounter (Signed)
  Patient is reporting new swelling in feet- patient is concerned because her swelling is so significant. Patient reports she has no chest pain/ breathing issues. Patient is concerned because of her heart stint history. Appointment made. Reason for Disposition . [1] MODERATE leg swelling (e.g., swelling extends up to knees) AND [2] new onset or worsening  Answer Assessment - Initial Assessment Questions 1. ONSET: "When did the swelling start?" (e.g., minutes, hours, days)     Swelling started Friday- but it went down- it came back- Saturday and it has not gotten better- R foot is worse that left foot 2. LOCATION: "What part of the leg is swollen?"  "Are both legs swollen or just one leg?"     Below the knee had swollen- but that went away 3. SEVERITY: "How bad is the swelling?" (e.g., localized; mild, moderate, severe)  - Localized - small area of swelling localized to one leg  - MILD pedal edema - swelling limited to foot and ankle, pitting edema < 1/4 inch (6 mm) deep, rest and elevation eliminate most or all swelling  - MODERATE edema - swelling of lower leg to knee, pitting edema > 1/4 inch (6 mm) deep, rest and elevation only partially reduce swelling  - SEVERE edema - swelling extends above knee, facial or hand swelling present      Severe swelling in feet 4. REDNESS: "Does the swelling look red or infected?"     No  5. PAIN: "Is the swelling painful to touch?" If so, ask: "How painful is it?"   (Scale 1-10; mild, moderate or severe)     no 6. FEVER: "Do you have a fever?" If so, ask: "What is it, how was it measured, and when did it start?"      no 7. CAUSE: "What do you think is causing the leg swelling?"     No- she has never had swelling this bad 8. MEDICAL HISTORY: "Do you have a history of heart failure, kidney disease, liver failure, or cancer?"     Patient has stints in her heart 9. RECURRENT SYMPTOM: "Have you had leg swelling before?" If so, ask: "When was the last time?"  "What happened that time?"     Not this bad- medication 10. OTHER SYMPTOMS: "Do you have any other symptoms?" (e.g., chest pain, difficulty breathing)       Sore throat, ear ache- started saturday 11. PREGNANCY: "Is there any chance you are pregnant?" "When was your last menstrual period?"       n/a  Protocols used: LEG SWELLING AND EDEMA-A-AH

## 2017-05-30 NOTE — Assessment & Plan Note (Signed)
stable overall by history and exam, and pt to continue medical treatment as before,  to f/u any worsening symptoms or concerns, for toradol IM 30

## 2017-05-30 NOTE — Assessment & Plan Note (Signed)
?   Clinical significance, pt asks for urine studies

## 2017-05-30 NOTE — Assessment & Plan Note (Signed)
Mild, likely element of venous insufficiency, for lasix 20 qd prn, leg elevation, compression stocking, wt loss

## 2017-05-30 NOTE — Assessment & Plan Note (Signed)
Mild to mod, for depomedrol IM 80,  to f/u any worsening symptoms or concerns 

## 2017-05-31 ENCOUNTER — Telehealth: Payer: Self-pay

## 2017-05-31 ENCOUNTER — Other Ambulatory Visit: Payer: Self-pay | Admitting: Internal Medicine

## 2017-05-31 LAB — URINE CULTURE
MICRO NUMBER: 90579790
SPECIMEN QUALITY:: ADEQUATE

## 2017-05-31 MED ORDER — NITROFURANTOIN MACROCRYSTAL 50 MG PO CAPS: 50.0000 mg | ORAL_CAPSULE | Freq: Four times a day (QID) | ORAL | 0 refills | Status: DC

## 2017-05-31 NOTE — Telephone Encounter (Signed)
Called pt, LVM with details below  

## 2017-05-31 NOTE — Telephone Encounter (Signed)
-----   Message from Biagio Borg, MD sent at 05/31/2017  2:31 PM EDT ----- Left message on MyChart, pt to cont same tx except  The test results show that your current treatment is OK, except although the urine testing seemed ok, the culture does show an infection. We even have the idea of the best antibiotic because this is included in the results.    We should treat with nitrofurantoin (macrobid) antibiotic, so a prescription will be sent, and you should hear from the office as well  Morgen Ritacco to please inform pt, I will do rx

## 2017-06-09 MED ORDER — ENOXAPARIN SODIUM 40 MG/0.4ML ~~LOC~~ SOLN
40.00 | SUBCUTANEOUS | Status: DC
Start: 2017-06-10 — End: 2017-06-09

## 2017-06-09 MED ORDER — MORPHINE SULFATE (PF) 2 MG/ML IV SOLN
2.00 | INTRAVENOUS | Status: DC
Start: ? — End: 2017-06-09

## 2017-06-09 MED ORDER — POLYSACCHARIDE IRON COMPLEX 150 MG PO CAPS
150.00 | ORAL_CAPSULE | ORAL | Status: DC
Start: 2017-06-10 — End: 2017-06-09

## 2017-06-09 MED ORDER — NITROGLYCERIN 0.4 MG SL SUBL
0.40 | SUBLINGUAL_TABLET | SUBLINGUAL | Status: DC
Start: ? — End: 2017-06-09

## 2017-06-09 MED ORDER — GABAPENTIN 300 MG PO CAPS
600.00 | ORAL_CAPSULE | ORAL | Status: DC
Start: 2017-06-09 — End: 2017-06-09

## 2017-06-09 MED ORDER — MAGNESIUM OXIDE 400 MG PO TABS
400.00 | ORAL_TABLET | ORAL | Status: DC
Start: 2017-06-09 — End: 2017-06-09

## 2017-06-09 MED ORDER — PANTOPRAZOLE SODIUM 40 MG PO TBEC
40.00 | DELAYED_RELEASE_TABLET | ORAL | Status: DC
Start: 2017-06-10 — End: 2017-06-09

## 2017-06-09 MED ORDER — METHOCARBAMOL 500 MG PO TABS
500.00 | ORAL_TABLET | ORAL | Status: DC
Start: 2017-06-09 — End: 2017-06-09

## 2017-06-09 MED ORDER — GENERIC EXTERNAL MEDICATION
500.00 | Status: DC
Start: 2017-06-09 — End: 2017-06-09

## 2017-06-09 MED ORDER — ONDANSETRON HCL 4 MG/2ML IJ SOLN
4.00 | INTRAMUSCULAR | Status: DC
Start: ? — End: 2017-06-09

## 2017-06-09 MED ORDER — METFORMIN HCL ER 500 MG PO TB24
500.00 | ORAL_TABLET | ORAL | Status: DC
Start: 2017-06-10 — End: 2017-06-09

## 2017-06-09 MED ORDER — ASPIRIN EC 81 MG PO TBEC
81.00 | DELAYED_RELEASE_TABLET | ORAL | Status: DC
Start: ? — End: 2017-06-09

## 2017-06-09 MED ORDER — MORPHINE SULFATE ER 15 MG PO TBCR
15.00 | EXTENDED_RELEASE_TABLET | ORAL | Status: DC
Start: 2017-06-09 — End: 2017-06-09

## 2017-06-09 MED ORDER — METOPROLOL TARTRATE 25 MG PO TABS
25.00 | ORAL_TABLET | ORAL | Status: DC
Start: 2017-06-09 — End: 2017-06-09

## 2017-06-09 MED ORDER — LORATADINE 10 MG PO TABS
10.00 | ORAL_TABLET | ORAL | Status: DC
Start: 2017-06-10 — End: 2017-06-09

## 2017-06-09 MED ORDER — CLOPIDOGREL BISULFATE 75 MG PO TABS
75.00 | ORAL_TABLET | ORAL | Status: DC
Start: ? — End: 2017-06-09

## 2017-06-09 MED ORDER — OXYCODONE HCL 5 MG PO TABS
5.00 | ORAL_TABLET | ORAL | Status: DC
Start: ? — End: 2017-06-09

## 2017-06-09 MED ORDER — ACETAMINOPHEN 325 MG PO TABS
650.00 | ORAL_TABLET | ORAL | Status: DC
Start: ? — End: 2017-06-09

## 2017-06-09 MED ORDER — ALLOPURINOL 300 MG PO TABS
300.00 | ORAL_TABLET | ORAL | Status: DC
Start: ? — End: 2017-06-09

## 2017-06-09 MED ORDER — LEVOTHYROXINE SODIUM 150 MCG PO TABS
150.00 | ORAL_TABLET | ORAL | Status: DC
Start: 2017-06-10 — End: 2017-06-09

## 2017-06-11 ENCOUNTER — Emergency Department (HOSPITAL_COMMUNITY): Payer: Medicare Other

## 2017-06-11 ENCOUNTER — Encounter (HOSPITAL_COMMUNITY): Payer: Self-pay | Admitting: Emergency Medicine

## 2017-06-11 ENCOUNTER — Inpatient Hospital Stay (HOSPITAL_COMMUNITY)
Admission: EM | Admit: 2017-06-11 | Discharge: 2017-06-15 | DRG: 206 | Disposition: A | Discharge status: Skilled Nursing Facility | Payer: Medicare Other | Source: Skilled Nursing Facility | Attending: Family Medicine | Admitting: Family Medicine

## 2017-06-11 ENCOUNTER — Other Ambulatory Visit: Payer: Self-pay

## 2017-06-11 DIAGNOSIS — I10 Essential (primary) hypertension: Secondary | ICD-10-CM | POA: Diagnosis present

## 2017-06-11 DIAGNOSIS — R0789 Other chest pain: Secondary | ICD-10-CM | POA: Diagnosis present

## 2017-06-11 DIAGNOSIS — E11311 Type 2 diabetes mellitus with unspecified diabetic retinopathy with macular edema: Secondary | ICD-10-CM | POA: Diagnosis not present

## 2017-06-11 DIAGNOSIS — Z833 Family history of diabetes mellitus: Secondary | ICD-10-CM

## 2017-06-11 DIAGNOSIS — I252 Old myocardial infarction: Secondary | ICD-10-CM

## 2017-06-11 DIAGNOSIS — D509 Iron deficiency anemia, unspecified: Secondary | ICD-10-CM | POA: Diagnosis present

## 2017-06-11 DIAGNOSIS — Z90722 Acquired absence of ovaries, bilateral: Secondary | ICD-10-CM

## 2017-06-11 DIAGNOSIS — G8929 Other chronic pain: Secondary | ICD-10-CM | POA: Diagnosis present

## 2017-06-11 DIAGNOSIS — Z8719 Personal history of other diseases of the digestive system: Secondary | ICD-10-CM

## 2017-06-11 DIAGNOSIS — Z803 Family history of malignant neoplasm of breast: Secondary | ICD-10-CM

## 2017-06-11 DIAGNOSIS — Z634 Disappearance and death of family member: Secondary | ICD-10-CM

## 2017-06-11 DIAGNOSIS — M545 Low back pain, unspecified: Secondary | ICD-10-CM | POA: Diagnosis present

## 2017-06-11 DIAGNOSIS — Z79891 Long term (current) use of opiate analgesic: Secondary | ICD-10-CM

## 2017-06-11 DIAGNOSIS — Z79899 Other long term (current) drug therapy: Secondary | ICD-10-CM

## 2017-06-11 DIAGNOSIS — K219 Gastro-esophageal reflux disease without esophagitis: Secondary | ICD-10-CM | POA: Diagnosis not present

## 2017-06-11 DIAGNOSIS — E785 Hyperlipidemia, unspecified: Secondary | ICD-10-CM | POA: Diagnosis present

## 2017-06-11 DIAGNOSIS — M940 Chondrocostal junction syndrome [Tietze]: Secondary | ICD-10-CM | POA: Diagnosis not present

## 2017-06-11 DIAGNOSIS — E669 Obesity, unspecified: Secondary | ICD-10-CM | POA: Diagnosis not present

## 2017-06-11 DIAGNOSIS — R079 Chest pain, unspecified: Secondary | ICD-10-CM

## 2017-06-11 DIAGNOSIS — R072 Precordial pain: Secondary | ICD-10-CM | POA: Diagnosis present

## 2017-06-11 DIAGNOSIS — J309 Allergic rhinitis, unspecified: Secondary | ICD-10-CM | POA: Diagnosis present

## 2017-06-11 DIAGNOSIS — K0889 Other specified disorders of teeth and supporting structures: Secondary | ICD-10-CM | POA: Diagnosis present

## 2017-06-11 DIAGNOSIS — D519 Vitamin B12 deficiency anemia, unspecified: Secondary | ICD-10-CM | POA: Diagnosis present

## 2017-06-11 DIAGNOSIS — Z7984 Long term (current) use of oral hypoglycemic drugs: Secondary | ICD-10-CM

## 2017-06-11 DIAGNOSIS — Z90710 Acquired absence of both cervix and uterus: Secondary | ICD-10-CM

## 2017-06-11 DIAGNOSIS — Z8679 Personal history of other diseases of the circulatory system: Secondary | ICD-10-CM | POA: Diagnosis not present

## 2017-06-11 DIAGNOSIS — Z955 Presence of coronary angioplasty implant and graft: Secondary | ICD-10-CM

## 2017-06-11 DIAGNOSIS — Z888 Allergy status to other drugs, medicaments and biological substances status: Secondary | ICD-10-CM

## 2017-06-11 DIAGNOSIS — D529 Folate deficiency anemia, unspecified: Secondary | ICD-10-CM | POA: Diagnosis not present

## 2017-06-11 DIAGNOSIS — Z6837 Body mass index (BMI) 37.0-37.9, adult: Secondary | ICD-10-CM | POA: Diagnosis not present

## 2017-06-11 DIAGNOSIS — D473 Essential (hemorrhagic) thrombocythemia: Secondary | ICD-10-CM | POA: Diagnosis present

## 2017-06-11 DIAGNOSIS — Z95828 Presence of other vascular implants and grafts: Secondary | ICD-10-CM

## 2017-06-11 DIAGNOSIS — Z79890 Hormone replacement therapy (postmenopausal): Secondary | ICD-10-CM

## 2017-06-11 DIAGNOSIS — Z7902 Long term (current) use of antithrombotics/antiplatelets: Secondary | ICD-10-CM

## 2017-06-11 DIAGNOSIS — M109 Gout, unspecified: Secondary | ICD-10-CM | POA: Diagnosis present

## 2017-06-11 DIAGNOSIS — I2510 Atherosclerotic heart disease of native coronary artery without angina pectoris: Secondary | ICD-10-CM | POA: Diagnosis not present

## 2017-06-11 DIAGNOSIS — Z880 Allergy status to penicillin: Secondary | ICD-10-CM

## 2017-06-11 DIAGNOSIS — E890 Postprocedural hypothyroidism: Secondary | ICD-10-CM | POA: Diagnosis present

## 2017-06-11 DIAGNOSIS — Z87891 Personal history of nicotine dependence: Secondary | ICD-10-CM

## 2017-06-11 DIAGNOSIS — Z981 Arthrodesis status: Secondary | ICD-10-CM

## 2017-06-11 DIAGNOSIS — F411 Generalized anxiety disorder: Secondary | ICD-10-CM | POA: Diagnosis present

## 2017-06-11 DIAGNOSIS — E782 Mixed hyperlipidemia: Secondary | ICD-10-CM | POA: Diagnosis present

## 2017-06-11 DIAGNOSIS — E119 Type 2 diabetes mellitus without complications: Secondary | ICD-10-CM | POA: Diagnosis not present

## 2017-06-11 DIAGNOSIS — K5900 Constipation, unspecified: Secondary | ICD-10-CM | POA: Diagnosis present

## 2017-06-11 DIAGNOSIS — Z7982 Long term (current) use of aspirin: Secondary | ICD-10-CM

## 2017-06-11 DIAGNOSIS — Z8261 Family history of arthritis: Secondary | ICD-10-CM

## 2017-06-11 DIAGNOSIS — M1A9XX0 Chronic gout, unspecified, without tophus (tophi): Secondary | ICD-10-CM | POA: Diagnosis not present

## 2017-06-11 DIAGNOSIS — Z8249 Family history of ischemic heart disease and other diseases of the circulatory system: Secondary | ICD-10-CM

## 2017-06-11 DIAGNOSIS — Z881 Allergy status to other antibiotic agents status: Secondary | ICD-10-CM

## 2017-06-11 DIAGNOSIS — E039 Hypothyroidism, unspecified: Secondary | ICD-10-CM | POA: Diagnosis present

## 2017-06-11 LAB — BASIC METABOLIC PANEL
Anion gap: 9 (ref 5–15)
BUN: 5 mg/dL — AB (ref 6–20)
CO2: 27 mmol/L (ref 22–32)
CREATININE: 0.88 mg/dL (ref 0.44–1.00)
Calcium: 7.7 mg/dL — ABNORMAL LOW (ref 8.9–10.3)
Chloride: 105 mmol/L (ref 101–111)
GFR calc Af Amer: >60 mL/min (ref >60)
GLUCOSE: 105 mg/dL — AB (ref 65–99)
POTASSIUM: 3.6 mmol/L (ref 3.5–5.1)
Sodium: 141 mmol/L (ref 135–145)

## 2017-06-11 LAB — CBC
HEMATOCRIT: 25.6 % — AB (ref 36.0–46.0)
Hemoglobin: 7.7 g/dL — ABNORMAL LOW (ref 12.0–15.0)
MCH: 22.1 pg — ABNORMAL LOW (ref 26.0–34.0)
MCHC: 30.1 g/dL (ref 30.0–36.0)
MCV: 73.4 fL — ABNORMAL LOW (ref 78.0–100.0)
PLATELETS: 408 10*3/uL — AB (ref 150–400)
RBC: 3.49 MIL/uL — ABNORMAL LOW (ref 3.87–5.11)
RDW: 21.2 % — AB (ref 11.5–15.5)
WBC: 7.9 10*3/uL (ref 4.0–10.5)

## 2017-06-11 LAB — BRAIN NATRIURETIC PEPTIDE: B Natriuretic Peptide: 110.3 pg/mL — ABNORMAL HIGH (ref 0.0–100.0)

## 2017-06-11 LAB — I-STAT TROPONIN, ED
Troponin i, poc: 0 ng/mL (ref 0.00–0.08)
Troponin i, poc: 0.03 ng/mL (ref 0.00–0.08)

## 2017-06-11 LAB — CBG MONITORING, ED: GLUCOSE-CAPILLARY: 66 mg/dL (ref 65–99)

## 2017-06-11 LAB — D-DIMER, QUANTITATIVE: D-Dimer, Quant: 3.18 ug/mL-FEU — ABNORMAL HIGH (ref 0.00–0.50)

## 2017-06-11 LAB — TROPONIN I: Troponin I: <0.03 ng/mL (ref <0.03)

## 2017-06-11 MED ORDER — INSULIN ASPART 100 UNIT/ML ~~LOC~~ SOLN: 0.0000 [IU] | SUBCUTANEOUS | Status: DC

## 2017-06-11 MED ORDER — SODIUM CHLORIDE 0.9 % IV SOLN: 250.0000 mL | INTRAVENOUS | Status: DC | PRN

## 2017-06-11 MED ORDER — SODIUM CHLORIDE 0.9% FLUSH
3.0000 mL | Freq: Two times a day (BID) | INTRAVENOUS | Status: DC
  Administered 2017-06-11 – 2017-06-15 (×5): 3 mL via INTRAVENOUS

## 2017-06-11 MED ORDER — ACETAMINOPHEN 650 MG RE SUPP: 650.0000 mg | Freq: Four times a day (QID) | RECTAL | Status: DC | PRN

## 2017-06-11 MED ORDER — IOPAMIDOL (ISOVUE-370) INJECTION 76%
100.0000 mL | Freq: Once | INTRAVENOUS | Status: AC | PRN
  Administered 2017-06-11: 100 mL via INTRAVENOUS

## 2017-06-11 MED ORDER — IOPAMIDOL (ISOVUE-370) INJECTION 76%
INTRAVENOUS | Status: AC
  Filled 2017-06-11: qty 100

## 2017-06-11 MED ORDER — SODIUM CHLORIDE 0.9% FLUSH
3.0000 mL | INTRAVENOUS | Status: DC | PRN
  Administered 2017-06-12: 3 mL via INTRAVENOUS

## 2017-06-11 MED ORDER — ACETAMINOPHEN 325 MG PO TABS
650.0000 mg | ORAL_TABLET | Freq: Four times a day (QID) | ORAL | Status: DC | PRN
  Administered 2017-06-12: 650 mg via ORAL
  Filled 2017-06-11: qty 2

## 2017-06-11 MED ORDER — NAPROXEN 250 MG PO TABS
500.0000 mg | ORAL_TABLET | Freq: Once | ORAL | Status: AC
  Administered 2017-06-11: 500 mg via ORAL
  Filled 2017-06-11: qty 2

## 2017-06-11 MED ORDER — ENOXAPARIN SODIUM 40 MG/0.4ML ~~LOC~~ SOLN
40.0000 mg | Freq: Every day | SUBCUTANEOUS | Status: DC
  Administered 2017-06-12 – 2017-06-15 (×4): 40 mg via SUBCUTANEOUS
  Filled 2017-06-11 (×3): qty 0.4

## 2017-06-11 NOTE — ED Notes (Signed)
Patient transported to CT 

## 2017-06-11 NOTE — ED Provider Notes (Signed)
[facility] EMERGENCY DEPARTMENT Provider Note   CSN: [id number] Arrival date & time: 06/11/17  1630     History   Chief Complaint Chief Complaint  Patient presents with  . Chest Pain    HPI [name] is a 72 y.o. female.  HPI  Patient with a history of AAA s/p graft repair, CAD s/p multiple stents, T2 DM, hyperlipidemia, hypertension, recent T4-S1 fusion (06/14/17) who presents emergency department today to have midsternal chest pain that began prior to arrival.  States pain is sharp in nature and is nonradiating.  She also reports chest tightness when she takes a deep breath and pain with inspiration.  This chest pain was also associated with nausea and diaphoresis.  She also had this pain yesterday which was relieved with nitro.  States that her pain is also improved after receiving aspirin and nitro via EMS prior to arrival.  She denies any recent URI symptoms including no cough, congestion, sore throat, ear pain.  She denies any abdominal pain.  She did recently have back surgery about 1 week ago and is complaining of back pain to this area however denies significant flank pain or change in her back pain since her chest pain started.  No numbness or weakness to her arms.  No headaches.  No dizziness or lightheadedness. Pt tells me she has not had a BM since she had her back surgery.   According to triage note patient had drop in blood pressure following administration of nitroglycerin.  Normal saline bolus was initiated.  He was also given 325 of aspirin  Of note patient, was recently admitted to [facility] and had t4-s1 fusion.  She remained in the hospital was discharged to [facility] rehab hospital on 06/09/2017.  On review of his hospital note it appears that patient was having intermittent chest pain throughout her stay in the hospital.   Contacted nursing staff from [facility] and spoke to [place] who stated that patient was  admitted to the rehab center on 06/09/2017.  They state that she had no complaints today to nursing staff.  She states that patient has been on Plavix and she was admitted there on 06/09/2017.  States that she had a BM this morning.   Past Medical History:  Diagnosis Date  . AAA (abdominal aortic aneurysm) ([place])    a. s/p stent graft repair 01/2016.  [name] Allergic rhinitis, cause unspecified   . Anxiety state, unspecified   . Chronic LBP   . Coronary artery disease    a. inferior MI 1998 s/p PCI of RCA. b. stenting of Cx 04/2010. c. DES to prox LAD 05/2013. d. DES x 3 in 10/2015.  [name] Degeneration of lumbar or lumbosacral intervertebral disc   . Depressive disorder, not elsewhere classified   . Diabetes mellitus    TYPE II  . Gout 08/22/2013  . Hyperlipidemia   . Hypertension   . Hypothyroidism   . Lower GI bleed 06/2010   Diverticular bleed  . Noncompliance with medications 02/26/2014  . Obesity, unspecified     Patient Active Problem List   Diagnosis Date Noted  . Coronary artery disease   . Bilateral swelling of feet 05/29/2017  . Pharyngitis 01/11/2017  . Vaginal yeast infection 07/05/2016  . Lower abdominal pain 07/05/2016  . Chronic low back pain 06/28/2016  . Insomnia 06/28/2016  . History of endovascular stent graft for abdominal aortic aneurysm (AAA) 01/22/2016  . Lumbar radiculopathy 05/15/2015  .  Acute sinus infection 05/14/2015  . Arthritis of left hip 04/16/2015  . Acute gout 01/16/2015  . Acute upper respiratory infection 06/09/2014  . Angioedema of lips 06/03/2014  . Frequent urination 05/08/2014  . Acute epiglottitis without obstruction 05/05/2014  . Angioedema   . Neck swelling   . Abdominal tenderness 03/26/2014  . Noncompliance with medications 02/26/2014  . AAA (abdominal aortic aneurysm) ([place])   . Chest pain 02/24/2014  . Jaw pain 09/25/2013  . Myalgia 09/25/2013  . Gout 08/22/2013  . Abdominal pain, unspecified site 08/13/2013  . Musculoskeletal pain  08/04/2013  . Aftercare following surgery of the circulatory system, [place] 06/25/2013  . Peripheral vascular disease, unspecified ([place]) 06/19/2012  . Dry cough 03/05/2012  . Dysuria 04/21/2011  . Low back pain 10/04/2010  . Acute ischemic colitis ([place]) 07/29/2010  . Lower GI bleed 07/23/2010  . Liver lesion 07/23/2010  . Encounter for well adult exam with abnormal findings 07/22/2010  . URTICARIA 09/23/2009  . TOBACCO ABUSE 11/18/2008  . Obesity 11/17/2008  . MENOPAUSAL DISORDER 10/29/2007  . ANXIETY 06/22/2007  . DEPRESSION 06/22/2007  . OTITIS MEDIA, SEROUS, CHRONIC 04/23/2007  . Constipation 02/07/2007  . [place] DISEASE, LUMBOSACRAL SPINE 02/07/2007  . Diabetes ([place]) 11/03/2006  . Allergic rhinitis 11/03/2006  . HYPOTHYROIDISM 07/31/2006  . Hyperlipidemia 07/31/2006  . Essential hypertension 07/31/2006  . Coronary artery disease involving native heart without angina pectoris 07/31/2006    Past Surgical History:  Procedure Laterality Date  . CARDIAC CATHETERIZATION     PCI OF BOTH THE CIRCUMFLEX AND LEFT ANTERIOR DESCENDING ARTERY  . CARDIAC CATHETERIZATION N/A 10/29/2015   Procedure: Coronary Stent Intervention;  Surgeon: Nelva Bush, MD;  Location: [place] CV LAB;  Service: Cardiovascular;  Laterality: N/A;  . CARDIAC CATHETERIZATION N/A 10/29/2015   Procedure: Coronary/Graft Angiography;  Surgeon: Nelva Bush, MD;  Location: [place] CV LAB;  Service: Cardiovascular;  Laterality: N/A;  . CARDIAC CATHETERIZATION N/A 10/29/2015   Procedure: Intravascular Pressure Wire/FFR Study;  Surgeon: Nelva Bush, MD;  Location: [place] CV LAB;  Service: Cardiovascular;  Laterality: N/A;  . CESAREAN SECTION    . ENDOVASCULAR STENT INSERTION N/A 01/22/2016   Procedure: ABDOMINAL AORTIC ENDOVASCULAR STENT GRAFT INSERTION;  Surgeon: Rosetta Posner, MD;  Location: [place];  Service: Vascular;  Laterality: N/A;  . HEART STENT  04-2010  and  Jun 07, 2013   X 3  . LEFT HEART  CATHETERIZATION WITH CORONARY ANGIOGRAM N/A 06/07/2013   Procedure: LEFT HEART CATHETERIZATION WITH CORONARY ANGIOGRAM;  Surgeon: [name], MD;  Location[facility] C[facility] LAB;  Service: Cardiovascular;  Laterality: N/A;  . LEFT HEART CATHETERIZATION WITH CORONARY ANGIOGRAM N/A 02/25/2014   Procedure: LEFT HEART CATHETERIZATION WITH CORONARY ANGIOGRAM;  Surgeon: Troy Sine, MD;  Location: [facility] CATH LAB;  Service: Cardiovascular;  Laterality: N/A;  . LUMBAR FUSION  01/2007   DR. Patrice Paradise...3-LEVEL WITH FIXATION  . OOPHORECTOMY     BSO? pt.unsure  . PARATHYROIDECTOMY    . SPINE SURGERY    . THYROIDECTOMY    . TOTAL ABDOMINAL HYSTERECTOMY       OB History    Gravida  3   Para  2   Term      Preterm      AB  1   Living  3     SAB  1   TAB      Ectopic      Multiple  1   Live Births  Home Medications    Prior to Admission medications   Medication Sig Start Date End Date Taking? Authorizing Provider  aspirin 81 MG EC tablet Take 81 mg by mouth daily.    Yes [provider]  atenolol (TENORMIN) 25 MG tablet TAKE 1 TABLET(25 MG) BY MOUTH TWICE DAILY Patient taking differently: TAKE 1 TABLET(25 MG) BY MOUTH EVERY DAY 04/24/17  Yes Biagio Borg, MD  clopidogrel (PLAVIX) 75 MG tablet Take 1 tablet (75 mg total) by mouth every evening. Patient taking differently: Take 75 mg by mouth daily.  12/19/16  Yes Sherren Mocha, MD  ferrous fumarate-iron polysaccharide complex (TANDEM) 162-115.2 MG CAPS capsule Take 1 capsule by mouth daily with breakfast. For 14 days 06/09/17 06/23/17 Yes [provider]  gabapentin (NEURONTIN) 300 MG capsule Take 1-2 capsules (300-600 mg total) by mouth daily as needed (for pain/neuropathy). 1-2 tab by mouth at bedtime Patient taking differently: Take 600 mg by mouth 3 (three) times daily.  01/22/16  Yes Rhyne, Hulen Shouts, PA-C  loratadine (CLARITIN) 10 MG tablet Take 10 mg by mouth daily.   Yes [provider]    metFORMIN (GLUCOPHAGE-XR) 500 MG 24 hr tablet TAKE 4 TABLETS BY MOUTH EVERY MORNING Patient taking differently: Take 500 mg by mouth daily with breakfast.  01/11/17  Yes Biagio Borg, MD  methocarbamol (ROBAXIN) 500 MG tablet Take 500 mg by mouth 3 (three) times daily.   Yes [provider]  morphine (MS CONTIN) 15 MG 12 hr tablet Take 15 mg by mouth every 12 (twelve) hours. For 7 days ten stop this medication. STOP: 06/16/17. Additional order: 15 mg daily at bedtime for 7 days Start: 06/17/17 Stop: 06/23/17 06/09/17 06/16/17 Yes [provider]  pantoprazole (PROTONIX) 40 MG tablet Take 40 mg by mouth daily.   Yes [provider]  SYNTHROID 150 MCG tablet TAKE 1 TABLET(150 MCG) BY MOUTH DAILY 05/02/17  Yes Biagio Borg, MD  allopurinol (ZYLOPRIM) 300 MG tablet Take 1 tablet (300 mg total) by mouth daily. Patient not taking: Reported on 06/11/2017 04/14/16   Biagio Borg, MD  atorvastatin (LIPITOR) 20 MG tablet Take 1 tablet (20 mg total) by mouth daily. Patient not taking: Reported on 06/11/2017 11/15/16 11/15/17  Biagio Borg, MD  baclofen (LIORESAL) 10 MG tablet Take 1 tablet (10 mg total) by mouth 2 (two) times daily. Patient not taking: Reported on 06/11/2017 11/09/16   Rosemarie Ax, MD  famotidine (PEPCID) 20 MG tablet Take 1 tablet (20 mg total) by mouth daily as needed (for acid reflux.). Patient not taking: Reported on 06/11/2017 01/22/16   Gabriel Earing, PA-C  furosemide (LASIX) 20 MG tablet Take 1 tablet (20 mg total) by mouth daily as needed. Patient not taking: Reported on 06/11/2017 05/29/17   Biagio Borg, MD  lactulose (CHRONULAC) 10 GM/15ML solution Take 45 mLs (30 g total) by mouth 2 (two) times daily as needed for mild constipation. Patient not taking: Reported on 06/11/2017 03/09/17   Biagio Borg, MD  nitrofurantoin (MACRODANTIN) 50 MG capsule Take 1 capsule (50 mg total) by mouth 4 (four) times daily. Patient not taking: Reported on 06/11/2017 05/31/17    Biagio Borg, MD  nitroGLYCERIN (NITROSTAT) 0.4 MG SL tablet Place 1 tablet (0.4 mg total) under the tongue every 5 (five) minutes as needed for chest pain. Patient not taking: Reported on 06/11/2017 11/09/15   Richardson Dopp T, PA-C  oxyCODONE (ROXICODONE) 5 MG immediate release tablet Take 1  tablet (5 mg total) by mouth every 6 (six) hours as needed for severe pain. Patient not taking: Reported on 06/11/2017 09/23/16   Biagio Borg, MD  tiZANidine (ZANAFLEX) 4 MG tablet Take 1 tablet (4 mg total) by mouth every 6 (six) hours as needed for muscle spasms. Patient not taking: Reported on 06/11/2017 11/15/16   Biagio Borg, MD  Vitamin D, Ergocalciferol, (DRISDOL) 50000 units CAPS capsule Take 1 capsule (50,000 Units total) by mouth every 7 (seven) days. Patient not taking: Reported on 06/11/2017 10/06/15   Lyndal Pulley, DO    Family History Family History  Problem Relation Age of Onset  . Heart attack Mother 7       s/p D&C-CARDIAC ARREST 1966  . Heart disease Mother   . Heart attack Father 69       1978 WITH MI  . Heart disease Father   . Diabetes Brother   . Diabetes Maternal Aunt   . Arthritis Maternal Aunt   . Breast cancer Maternal Aunt        Post menopausal  . Colon cancer Neg Hx     Social History Social History   Tobacco Use  . Smoking status: Former Smoker    Years: 30.00    Types: Cigarettes    Last attempt to quit: 05/31/1986    Years since quitting: 31.0  . Smokeless tobacco: Never Used  Substance Use Topics  . Alcohol use: No  . Drug use: No    Allergies   Prilosec [omeprazole]; Miconazole nitrate; Crestor [rosuvastatin calcium]; Augmentin [amoxicillin-pot clavulanate]; and Doxycycline  Review of Systems Review of Systems  Constitutional: Positive for diaphoresis. Negative for chills and fever.  HENT: Negative for congestion, rhinorrhea and sore throat.   Eyes: Negative for visual disturbance.  Respiratory: Positive for shortness of breath. Negative for  cough.   Cardiovascular: Positive for chest pain. Negative for leg swelling.  Gastrointestinal: Positive for constipation and nausea. Negative for abdominal pain, blood in stool, diarrhea and vomiting.  Genitourinary: Negative for flank pain and urgency.  Musculoskeletal: Positive for back pain.  Skin: Negative for wound.  Neurological: Negative for dizziness, weakness, light-headedness, numbness and headaches.   Physical Exam Updated Vital Signs BP 112/70   Pulse 63   Temp 98.4 F (36.9 C) (Oral)   Resp 11   Ht 5\' 6"  (1.676 m)   Wt 96.2 kg (212 lb)   SpO2 100%   BMI 34.22 kg/m   Physical Exam  Constitutional: She appears well-developed and well-nourished. No distress.  HENT:  Head: Normocephalic and atraumatic.  Eyes: Conjunctivae are normal.  Neck: Neck supple.  Cardiovascular: Normal rate, regular rhythm and normal pulses.  No murmur heard. Pulmonary/Chest: Effort normal and breath sounds normal. No respiratory distress. She has no decreased breath sounds. She has no wheezes. She has no rhonchi. She has no rales.  Chest wall pain which does not reproduce her pain  Abdominal: Soft. Bowel sounds are normal. She exhibits no distension and no mass. There is no tenderness. There is no guarding.  No pulsatile abdominal mass  Musculoskeletal: She exhibits no edema.       Right lower leg: Normal. She exhibits no tenderness and no edema.       Left lower leg: Normal. She exhibits no tenderness and no edema.  Neurological: She is alert.  Skin: Skin is warm and dry. Capillary refill takes less than 2 seconds.  Psychiatric: She has a normal mood and affect.  Nursing note  and vitals reviewed.  ED Treatments / Results  Labs (all labs ordered are listed, but only abnormal results are displayed) Labs Reviewed  BASIC METABOLIC PANEL - Abnormal; Notable for the following components:      Result Value   Glucose, Bld 105 (*)    BUN 5 (*)    Calcium 7.7 (*)    All other components  within normal limits  CBC - Abnormal; Notable for the following components:   RBC 3.49 (*)    Hemoglobin 7.7 (*)    HCT 25.6 (*)    MCV 73.4 (*)    MCH 22.1 (*)    RDW 21.2 (*)    Platelets 408 (*)    All other components within normal limits  BRAIN NATRIURETIC PEPTIDE - Abnormal; Notable for the following components:   B Natriuretic Peptide 110.3 (*)    All other components within normal limits  D-DIMER, QUANTITATIVE (NOT AT [facility]) - Abnormal; Notable for the following components:   D-Dimer, Quant 3.18 (*)    All other components within normal limits  URINE CULTURE  URINALYSIS, ROUTINE W REFLEX MICROSCOPIC  COMPREHENSIVE METABOLIC PANEL  CBC  TROPONIN I  TROPONIN I  TROPONIN I  I-STAT TROPONIN, ED  I-STAT TROPONIN, ED  CBG MONITORING, ED  TYPE AND SCREEN    EKG EKG Interpretation  Date/Time:  Sunday Jun 11 2017 16:36:29 EDT Ventricular Rate:  63 PR Interval:    QRS Duration: 101 QT Interval:  452 QTC Calculation: 463 R Axis:   -29 Text Interpretation:  Sinus rhythm Borderline left axis deviation Low voltage, precordial leads No significant change since last tracing Confirmed by Zenovia Jarred [id number]) on 06/11/2017 6:50:36 PM   Radiology Dg Chest 2 View  Result Date: 06/11/2017 CLINICAL DATA:  Pt BIB EMS from [facility] where she is in rehab following back surgery. Pt c/o CP with sudden onset around 1430 today, substernal & non-radiating. Reports mild SOB EXAM: CHEST - 2 VIEW COMPARISON:  06/05/2017 FINDINGS: Cardiac silhouette is normal in size. Right coronary artery stent. No mediastinal or hilar masses. No convincing adenopathy Clear lungs.  No pleural effusion or pneumothorax. There has been a long thoracolumbar fusion with pedicle screws interconnecting rods extending from T4 through the visualized lumbar spine. IMPRESSION: No acute cardiopulmonary disease. Electronically Signed   By: Lajean Manes M.D.   On: 06/11/2017 17:44   Ct Angio Chest Pe W And/or  Wo Contrast  Result Date: 06/11/2017 CLINICAL DATA:  Acute onset of generalized chest pain and mild shortness of breath. EXAM: CT ANGIOGRAPHY CHEST WITH CONTRAST TECHNIQUE: Multidetector CT imaging of the chest was performed using the standard protocol during bolus administration of intravenous contrast. Multiplanar CT image reconstructions and MIPs were obtained to evaluate the vascular anatomy. CONTRAST:  186mL ISOVUE-370 IOPAMIDOL (ISOVUE-370) INJECTION 76% COMPARISON:  Chest radiograph performed earlier today at 5:29 p.m., and CT of the abdomen and pelvis performed 02/24/2017 FINDINGS: Cardiovascular: There is no evidence of significant pulmonary embolus. The heart is borderline normal in size. Diffuse coronary artery calcifications are seen. Mild calcification is noted along the aortic arch and proximal great vessels. Mediastinum/Nodes: A 1.3 cm right paratracheal node is noted. A 1.5 cm subcarinal node is suspected. No pericardial effusion is identified. The patient is status post thyroidectomy. No axillary lymphadenopathy is seen. Lungs/Pleura: A trace left-sided pleural effusion is noted. Patchy bilateral atelectasis is noted. No pneumothorax is seen. No masses are identified. Upper Abdomen: A 3.0 cm cyst is noted at the  left hepatic lobe, likely mildly increased in size. The visualized portions of the liver and spleen are otherwise unremarkable. The visualized portions of the pancreas, adrenal glands and kidneys are within normal limits. Musculoskeletal: No acute osseous abnormalities are identified. Thoracolumbar spinal fusion rods are noted. There is chronic grade 1 anterolisthesis of T9 on T10. The visualized musculature is unremarkable in appearance. Review of the MIP images confirms the above findings. IMPRESSION: 1. No evidence of significant pulmonary embolus. 2. Trace left-sided pleural effusion. Patchy bilateral atelectasis noted. Lungs otherwise grossly clear. 3. Diffuse coronary artery  calcifications. 4. Nonspecific prominence of mediastinal nodes, measuring up to 1.5 cm in short axis. 5. Left hepatic cyst again noted. Electronically Signed   By: Garald Balding M.D.   On: 06/11/2017 20:40    Procedures Procedures (including critical care time)  Medications Ordered in ED Medications  iopamidol (ISOVUE-370) 76 % injection (has no administration in time range)  enoxaparin (LOVENOX) injection 40 mg (has no administration in time range)  sodium chloride flush (NS) 0.9 % injection 3 mL (3 mLs Intravenous Given 06/11/17 2248)  sodium chloride flush (NS) 0.9 % injection 3 mL (has no administration in time range)  0.9 %  sodium chloride infusion (has no administration in time range)  acetaminophen (TYLENOL) tablet 650 mg (has no administration in time range)    Or  acetaminophen (TYLENOL) suppository 650 mg (has no administration in time range)  insulin aspart (novoLOG) injection 0-9 Units (has no administration in time range)  naproxen (NAPROSYN) tablet 500 mg (500 mg Oral Given 06/11/17 1949)  iopamidol (ISOVUE-370) 76 % injection 100 mL (100 mLs Intravenous Contrast Given 06/11/17 2018)     Initial Impression / Assessment and Plan / ED Course  I have reviewed the triage vital signs and the nursing notes.  Pertinent labs & imaging results that were available during my care of the patient were reviewed by me and considered in my medical decision making (see chart for details).  9:49 PM CONSULT, Dr. Maudie Mercury who recommended cardiology consult as well. He will admit the patient to stepdown.  10:46 PM CONSULT, Dr. Rhae Hammock, from cardiology who will see the patient.   Final Clinical Impressions(s) / ED Diagnoses   Final diagnoses:  Chest pain, unspecified type   Pt is 72 y/o female with recent h/o t4-s1 spinal fusion on 06/05/17 at [facility], discharged to rehab center on 5/25 presenting today with midsternal chest pain began this morning.  She was given nitro and  aspirin in route by EMS and pain improved.  She has had one more recurrence of pain since she has been in the emergency department.  Blood pressure has been somewhat low in the 326Z systolic.  She is afebrile.  Normal heart rate respirations and O2 sats.  She does have some chest wall tenderness but she states this is not consistent with the pain that she has been feeling inside her chest and does not reproduce her pain.  Pulmonary exam was within normal limits.  There is no abdominal tenderness or pulsating mass to suggest AAA at this time.  Given her history of recent surgery there was concern for pulmonary embolism however CTA of the chest was negative for PE.  She did have a left pleural effusion noted on exam. UA is currently pending.  Patient has no urinary symptoms. Patient with no leukocytosis.  Does have anemia to 7.7 which I suspect is due to her recent history of surgery.  Her BNP  is slightly elevated 110.  Her d-dimer was elevated and therefore she CTA to rule out PE.  Delta trop was negative however feel that patient will require admission given her long h/o cardiac disease with multiple cardiac caths and stents, HTN, HLD, obesity, T2DM. and a heart score is 5 in moderate range.  Her chest x-ray shows no active cardiopulmonary disease.    Heart score 5. Plan for admission for serial troponins and further cardiac workup due to concern for ACS in patient with intermittent ongoing chest pain with complicated cardiac history. Admitted to hospitalist service. Cardiology consulted.   ED Discharge Orders    None       Bishop Dublin 06/11/17 2300    Macarthur Critchley, MD 06/14/17 [phone]

## 2017-06-11 NOTE — Consult Note (Signed)
Cardiology Consultation:   Patient ID: [name]; [id number]; 08/28/1945   Admit date: 06/11/2017 Date of Consult: 06/11/2017  Primary Care Provider: Biagio Borg, MD Primary Cardiologist: Burt Knack  Patient Profile:   [name] is a 72 y.o. female with a hx of  CAD (inferior MI 1998 s/p PCI of RCA, stenting of Cx 04/2010, DES to prox LAD 05/2013, DES x 3 in 10/2015), AAA s/p stent graft repair in 01/2016, anxiety, chronic LBP, DM, depression, HTN, HLD, hypothyroidism, prior lower GIB/diverticular bleed 2012, obesity, former tobacco (30-40 yrs)  who is being seen today for the evaluation of chest pain at the request of Cortni Couture.  History of Present Illness:   [name] is a 72 y.o. female with history of CAD (inferior MI 1998 s/p PCI of RCA, stenting of Cx 04/2010, DES to prox LAD 05/2013, DES x 3 in 10/2015), AAA s/p stent graft repair in 01/2016, anxiety, chronic LBP, DM, depression, HTN, HLD, hypothyroidism, prior lower GIB/diverticular bleed 2012, obesity, former tobacco (30-40 yrs) who presents to the ED with chest pain.  The patient has a history of significant vascular disease with multiple prior PCI (last 2017) as well as AAA repair. She follows with Dr. Burt Knack in clinic and was last seen for preoperative evaluation in 11/2016 prior to back surgery. At that visit she reported limiting DOE and atypical chest discomfort. Her symptoms were felt to be multifactorial with a pulmonary component. She underwent nuclear stress test that showed borderline EF with small apical defect felt to be low risk. She underwent back surgery about 1 week ago. She was discharged to rehab.   She presents to the ED this evening with chest discomfort. She reports that she has experienced continued intermittent chest discomfort that is similar in quality to that described at her last clinic appointment, although it has become more frequent. She states that her pain is a sharp sensation across her chest  that is worse with breathing. She had some relief of her symptoms with NTG. She denies dyspnea or edema or other cardiac symptoms. She does complain of some tingling in her extremities.  In the ED, ECG showed NSR with low voltage and no acute ischemic changes. Troponin negative x2, BNP 110. Hgb 7.7 (was 10.5 in 02/2017). D-dimer 3.2, and subsequent CTA was negative for PE.    Past Medical History:  Diagnosis Date  . AAA (abdominal aortic aneurysm) ([place])    a. s/p stent graft repair 01/2016.  [name] Allergic rhinitis, cause unspecified   . Anxiety state, unspecified   . Chronic LBP   . Coronary artery disease    a. inferior MI 1998 s/p PCI of RCA. b. stenting of Cx 04/2010. c. DES to prox LAD 05/2013. d. DES x 3 in 10/2015.  [name] Degeneration of lumbar or lumbosacral intervertebral disc   . Depressive disorder, not elsewhere classified   . Diabetes mellitus    TYPE II  . Gout 08/22/2013  . Hyperlipidemia   . Hypertension   . Hypothyroidism   . Lower GI bleed 06/2010   Diverticular bleed  . Noncompliance with medications 02/26/2014  . Obesity, unspecified     Past Surgical History:  Procedure Laterality Date  . CARDIAC CATHETERIZATION     PCI OF BOTH THE CIRCUMFLEX AND LEFT ANTERIOR DESCENDING ARTERY  . CARDIAC CATHETERIZATION N/A 10/29/2015   Procedure: Coronary Stent Intervention;  Surgeon: Nelva Bush, MD;  Location: [place] [facility];  Service: Cardiovascular;  Laterality: N/A;  .  CARDIAC CATHETERIZATION N/A 10/29/2015   Procedure: Coronary/Graft Angiography;  Surgeon: Nelva Bush, MD;  Location: [place] [facility];  Service: Cardiovascular;  Laterality: N/A;  . CARDIAC CATHETERIZATION N/A 10/29/2015   Procedure: Intravascular Pressure Wire/FFR Study;  Surgeon: Nelva Bush, MD;  Location: [place] [facility];  Service: Cardiovascular;  Laterality: N/A;  . CESAREAN SECTION    . ENDOVASCULAR STENT INSERTION N/A 01/22/2016   Procedure: ABDOMINAL AORTIC ENDOVASCULAR STENT GRAFT  INSERTION;  Surgeon: Rosetta Posner, MD;  Location: [place];  Service: Vascular;  Laterality: N/A;  . HEART STENT  04-2010  and  Jun 07, 2013   X 3  . LEFT HEART CATHETERIZATION WITH CORONARY ANGIOGRAM N/A 06/07/2013   Procedure: LEFT HEART CATHETERIZATION WITH CORONARY ANGIOGRAM;  Surgeon: [name], MD;  Location: [facility] CATH LAB;  Service: Cardiovascular;  Laterality: N/A;  . LEFT HEART CATHETERIZATION WITH CORONARY ANGIOGRAM N/A 02/25/2014   Procedure: LEFT HEART CATHETERIZATION WITH CORONARY ANGIOGRAM;  Surgeon: Troy Sine, MD;  Location[facility] CATH LAB;  Service: Cardiovascular;  Laterality: N/A;  . LUMBAR FUSION  01/2007   DR. Patrice Paradise...3-LEVEL WITH FIXATION  . OOPHORECTOMY     BSO? pt.unsure  . PARATHYROIDECTOMY    . SPINE SURGERY    . THYROIDECTOMY    . TOTAL ABDOMINAL HYSTERECTOMY       Home Medications:  Prior to Admission medications   Medication Sig Start Date End Date Taking? Authorizing Provider  aspirin 81 MG EC tablet Take 81 mg by mouth daily.    Yes [provider]  atenolol (TENORMIN) 25 MG tablet TAKE 1 TABLET(25 MG) BY MOUTH TWICE DAILY Patient taking differently: TAKE 1 TABLET(25 MG) BY MOUTH EVERY DAY 04/24/17  Yes Biagio Borg, MD  clopidogrel (PLAVIX) 75 MG tablet Take 1 tablet (75 mg total) by mouth every evening. Patient taking differently: Take 75 mg by mouth daily.  12/19/16  Yes Sherren Mocha, MD  ferrous fumarate-iron polysaccharide complex (TANDEM) 162-115.2 MG CAPS capsule Take 1 capsule by mouth daily with breakfast. For 14 days 06/09/17 06/23/17 Yes [provider]  gabapentin (NEURONTIN) 300 MG capsule Take 1-2 capsules (300-600 mg total) by mouth daily as needed (for pain/neuropathy). 1-2 tab by mouth at bedtime Patient taking differently: Take 600 mg by mouth 3 (three) times daily.  01/22/16  Yes Rhyne, Hulen Shouts, PA-C  loratadine (CLARITIN) 10 MG tablet Take 10 mg by mouth daily.   Yes [provider]  metFORMIN  (GLUCOPHAGE-XR) 500 MG 24 hr tablet TAKE 4 TABLETS BY MOUTH EVERY MORNING Patient taking differently: Take 500 mg by mouth daily with breakfast.  01/11/17  Yes Biagio Borg, MD  methocarbamol (ROBAXIN) 500 MG tablet Take 500 mg by mouth 3 (three) times daily.   Yes [provider]  morphine (MS CONTIN) 15 MG 12 hr tablet Take 15 mg by mouth every 12 (twelve) hours. For 7 days ten stop this medication. STOP: 06/16/17. Additional order: 15 mg daily at bedtime for 7 days Start: 06/17/17 Stop: 06/23/17 06/09/17 06/16/17 Yes [provider]  pantoprazole (PROTONIX) 40 MG tablet Take 40 mg by mouth daily.   Yes [provider]  SYNTHROID 150 MCG tablet TAKE 1 TABLET(150 MCG) BY MOUTH DAILY 05/02/17  Yes Biagio Borg, MD  allopurinol (ZYLOPRIM) 300 MG tablet Take 1 tablet (300 mg total) by mouth daily. Patient not taking: Reported on 06/11/2017 04/14/16   Biagio Borg, MD  atorvastatin (LIPITOR) 20 MG tablet Take 1 tablet (20  mg total) by mouth daily. Patient not taking: Reported on 06/11/2017 11/15/16 11/15/17  Biagio Borg, MD  baclofen (LIORESAL) 10 MG tablet Take 1 tablet (10 mg total) by mouth 2 (two) times daily. Patient not taking: Reported on 06/11/2017 11/09/16   Rosemarie Ax, MD  famotidine (PEPCID) 20 MG tablet Take 1 tablet (20 mg total) by mouth daily as needed (for acid reflux.). Patient not taking: Reported on 06/11/2017 01/22/16   Gabriel Earing, PA-C  furosemide (LASIX) 20 MG tablet Take 1 tablet (20 mg total) by mouth daily as needed. Patient not taking: Reported on 06/11/2017 05/29/17   Biagio Borg, MD  lactulose (CHRONULAC) 10 GM/15ML solution Take 45 mLs (30 g total) by mouth 2 (two) times daily as needed for mild constipation. Patient not taking: Reported on 06/11/2017 03/09/17   Biagio Borg, MD  nitrofurantoin (MACRODANTIN) 50 MG capsule Take 1 capsule (50 mg total) by mouth 4 (four) times daily. Patient not taking: Reported on 06/11/2017 05/31/17   Biagio Borg, MD  nitroGLYCERIN (NITROSTAT) 0.4 MG SL tablet Place 1 tablet (0.4 mg total) under the tongue every 5 (five) minutes as needed for chest pain. Patient not taking: Reported on 06/11/2017 11/09/15   Richardson Dopp T, PA-C  oxyCODONE (ROXICODONE) 5 MG immediate release tablet Take 1 tablet (5 mg total) by mouth every 6 (six) hours as needed for severe pain. Patient not taking: Reported on 06/11/2017 09/23/16   Biagio Borg, MD  tiZANidine (ZANAFLEX) 4 MG tablet Take 1 tablet (4 mg total) by mouth every 6 (six) hours as needed for muscle spasms. Patient not taking: Reported on 06/11/2017 11/15/16   Biagio Borg, MD  Vitamin D, Ergocalciferol, (DRISDOL) 50000 units CAPS capsule Take 1 capsule (50,000 Units total) by mouth every 7 (seven) days. Patient not taking: Reported on 06/11/2017 10/06/15   Lyndal Pulley, DO    Inpatient Medications: Scheduled Meds: . [START ON 06/12/2017] enoxaparin (LOVENOX) injection  40 mg Subcutaneous Daily  . insulin aspart  0-9 Units Subcutaneous Q4H  . iopamidol      . sodium chloride flush  3 mL Intravenous Q12H   Continuous Infusions: . sodium chloride     PRN Meds: sodium chloride, acetaminophen **OR** acetaminophen, sodium chloride flush  Allergies:    Allergies  Allergen Reactions  . Prilosec [Omeprazole] Other (See Comments)    Chest pain  . Miconazole Nitrate Hives    REACTION: hives  . Crestor [Rosuvastatin Calcium] Other (See Comments)    Feeling poor  . Augmentin [Amoxicillin-Pot Clavulanate] Hives, Itching and Rash    Has patient had a PCN reaction causing immediate rash, facial/tongue/throat swelling, SOB or lightheadedness with hypotension:unsure Has patient had a PCN reaction causing severe rash involving mucus membranes or skin necrosis:unsure Has patient had a PCN reaction that required hospitalization:No Has patient had a PCN reaction occurring within the last 10 years:NO If all of the above answers are "NO", then may proceed with  Cephalosporin use. Has patient had a PCN reaction causing immediate rash, facial/tongue/throat swelling, SOB or lightheadedness with hypotension:unsure Has patient had a PCN reaction causing severe rash involving mucus membranes or skin necrosis:unsure Has patient had a PCN reaction that required hospitalization:No Has patient had a PCN reaction occurring within the last 10 years:NO If all of the above answers are "NO", then may proceed with Cephalosporin use.   [name] Doxycycline Other (See Comments)    REACTION: gi upset    Social History:  Social History   Socioeconomic History  . Marital status: Divorced    Spouse name: Not on file  . Number of children: 3  . Years of education: Not on file  . Highest education level: Not on file  Occupational History  . 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 Financial resource strain: Not on file  . Food insecurity:    Worry: Not on file    Inability: Not on file  . Transportation needs:    Medical: Not on file    Non-medical: Not on file  Tobacco Use  . Smoking status: Former Smoker    Years: 30.00    Types: Cigarettes    Last attempt to quit: 05/31/1986    Years since quitting: 31.0  . Smokeless tobacco: Never Used  Substance and Sexual Activity  . Alcohol use: No  . Drug use: No  . Sexual activity: Not Currently    Comment: 1st intercourse 72 yo-Fewer than 5 partners  Lifestyle  . Physical activity:    Days per week: Not on file    Minutes per session: Not on file  . Stress: Not on file  Relationships  . Social connections:    Talks on phone: Not on file    Gets together: Not on file    Attends religious service: Not on file    Active member of club or organization: Not on file    Attends meetings of clubs or organizations: Not on file    Relationship status: Not on file  . Intimate partner violence:    Fear of current or ex partner: Not on file      Emotionally abused: Not on file    Physically abused: Not on file    Forced sexual activity: Not on file  Other Topics Concern  . Not on file  Social History Narrative   DIVORCED   3 CHILDREN   PATIENT SIGNED A DESIGNATED PARTY RELEASE TO ALLOW HER DAUGHTER, TRAMAINE Boulanger, TO HAVE ACCESS TO HER MEDICAL RECORDS/INFORMATION. [name], May 04, 2009 @ 3:27 PM   Smokes cigarettes on rare occasions.    Family History:    Family History  Problem Relation Age of Onset  . Heart attack Mother 110       s/p D&C-CARDIAC ARREST 1966  . Heart disease Mother   . Heart attack Father 65       1978 WITH MI  . Heart disease Father   . Diabetes Brother   . Diabetes Maternal Aunt   . Arthritis Maternal Aunt   . Breast cancer Maternal Aunt        Post menopausal  . Colon cancer Neg Hx      ROS:  Please see the history of present illness.  All other ROS reviewed and negative.     Physical Exam/Data:   Vitals:   06/11/17 1931 06/11/17 2100 06/11/17 2130 06/11/17 2230  BP: (!) 95/52 111/71 112/70 (!) 110/46  Pulse: 63 66 63 67  Resp: 16 14 11  (!) 23  Temp:      TempSrc:      SpO2: 99% 100% 100% 98%  Weight:      Height:        Intake/Output Summary (Last 24 hours) at 06/11/2017 2344 Last data filed at 06/11/2017 1636 Gross per 24 hour  Intake 150 ml  Output -  Net 150  ml   Filed Weights   06/11/17 1654  Weight: 96.2 kg (212 lb)   Body mass index is 34.22 kg/m.  General:  Well nourished, well developed, in no acute distress. Very pleasant HEENT: normal with poor dentition Neck: no JVD Cardiac:  normal S1, S2; RRR; no murmur Lungs:  clear to auscultation bilaterally, no wheezing, rhonchi or rales  Abd: soft, nontender, no hepatomegaly  Ext: no edema Musculoskeletal:  No deformities, BUE and BLE strength normal and equal Skin: warm and dry  Neuro:  No focal abnormalities noted Psych:  Normal affect   EKG:  The EKG was personally reviewed and demonstrates:  As  above Telemetry:  Telemetry was personally reviewed and demonstrates:  NSR  Relevant CV Studies: Nuclear Stress Test 11/2016:  Nuclear stress EF: 50%. No wall motion abnormalities  There was no ST segment deviation noted during stress.  Defect 1: There is a small defect of mild severity present in the apical septal and apex location. This may be indicative of breast attenuation artifact.  This is a low risk study. No large areas of ischemia noted   LHC 10/2015: 1. Multivessel coronary artery disease, including 60% ostial and 70% proximal/mid RCA disease that is hemodynamically significant by FFR (0.71).  Mild in-stent restenosis of LAD stents, as well as 60% stenosis in the mid LCx adjacent to previously stented large OM are also present. 2. Successful FFR-guided PCI of the RCA from the ostium to the midvessel with three overlapping Promus Premier stents post-dilated to 3.25 mm with 0% residual stenosis and TIMI-3 flow. 3. Patient had significant pain chest pain beginning with FFR and continuing throughout intervention despite aggressive sedation and analgesia.  No clear angiographic or electrocardiographic etiology was identified.  Pain had returned to pre-cath level by the end of the procedure.  TTE 10/2015: - Left ventricle: The cavity size was normal. Wall thickness was   normal. Systolic function was normal. The estimated ejection   fraction was in the range of 55% to 60%. Wall motion was normal;   there were no regional wall motion abnormalities. Doppler   parameters are consistent with abnormal left ventricular   relaxation (grade 1 diastolic dysfunction). - Mitral valve: Calcified annulus.  Laboratory Data:  Chemistry Recent Labs  Lab 06/11/17 1658  NA 141  K 3.6  CL 105  CO2 27  GLUCOSE 105*  BUN 5*  CREATININE 0.88  CALCIUM 7.7*  GFRNONAA >60  GFRAA >60  ANIONGAP 9    No results for input(s): PROT, ALBUMIN, AST, ALT, ALKPHOS, BILITOT in the last 168  hours. Hematology Recent Labs  Lab 06/11/17 1658  WBC 7.9  RBC 3.49*  HGB 7.7*  HCT 25.6*  MCV 73.4*  MCH 22.1*  MCHC 30.1  RDW 21.2*  PLT 408*   Cardiac Enzymes Recent Labs  Lab 06/11/17 2255  TROPONINI <0.03    Recent Labs  Lab 06/11/17 1706 06/11/17 1958  TROPIPOC 0.00 0.03    BNP Recent Labs  Lab 06/11/17 1658  BNP 110.3*    DDimer  Recent Labs  Lab 06/11/17 1701  DDIMER 3.18*    Radiology/Studies:  Dg Chest 2 View  Result Date: 06/11/2017 CLINICAL DATA:  Pt BIB EMS from [facility] where she is in rehab following back surgery. Pt c/o CP with sudden onset around 1430 today, substernal & non-radiating. Reports mild SOB EXAM: CHEST - 2 VIEW COMPARISON:  06/05/2017 FINDINGS: Cardiac silhouette is normal in size. Right coronary artery stent. No mediastinal  or hilar masses. No convincing adenopathy Clear lungs.  No pleural effusion or pneumothorax. There has been a long thoracolumbar fusion with pedicle screws interconnecting rods extending from T4 through the visualized lumbar spine. IMPRESSION: No acute cardiopulmonary disease. Electronically Signed   By: Lajean Manes M.D.   On: 06/11/2017 17:44   Ct Angio Chest Pe W And/or Wo Contrast  Result Date: 06/11/2017 CLINICAL DATA:  Acute onset of generalized chest pain and mild shortness of breath. EXAM: CT ANGIOGRAPHY CHEST WITH CONTRAST TECHNIQUE: Multidetector CT imaging of the chest was performed using the standard protocol during bolus administration of intravenous contrast. Multiplanar CT image reconstructions and MIPs were obtained to evaluate the vascular anatomy. CONTRAST:  163mL ISOVUE-370 IOPAMIDOL (ISOVUE-370) INJECTION 76% COMPARISON:  Chest radiograph performed earlier today at 5:29 p.m., and CT of the abdomen and pelvis performed 02/24/2017 FINDINGS: Cardiovascular: There is no evidence of significant pulmonary embolus. The heart is borderline normal in size. Diffuse coronary artery calcifications are  seen. Mild calcification is noted along the aortic arch and proximal great vessels. Mediastinum/Nodes: A 1.3 cm right paratracheal node is noted. A 1.5 cm subcarinal node is suspected. No pericardial effusion is identified. The patient is status post thyroidectomy. No axillary lymphadenopathy is seen. Lungs/Pleura: A trace left-sided pleural effusion is noted. Patchy bilateral atelectasis is noted. No pneumothorax is seen. No masses are identified. Upper Abdomen: A 3.0 cm cyst is noted at the left hepatic lobe, likely mildly increased in size. The visualized portions of the liver and spleen are otherwise unremarkable. The visualized portions of the pancreas, adrenal glands and kidneys are within normal limits. Musculoskeletal: No acute osseous abnormalities are identified. Thoracolumbar spinal fusion rods are noted. There is chronic grade 1 anterolisthesis of T9 on T10. The visualized musculature is unremarkable in appearance. Review of the MIP images confirms the above findings. IMPRESSION: 1. No evidence of significant pulmonary embolus. 2. Trace left-sided pleural effusion. Patchy bilateral atelectasis noted. Lungs otherwise grossly clear. 3. Diffuse coronary artery calcifications. 4. Nonspecific prominence of mediastinal nodes, measuring up to 1.5 cm in short axis. 5. Left hepatic cyst again noted. Electronically Signed   By: Garald Balding M.D.   On: 06/11/2017 20:40    Assessment and Plan:   Chest pain CAD with multiple prior PCI The patient has a history of significant CAD with prior PCI. She also has atypical chest pain. She presents with progression of her chronic chest pain described at her prior clinic visit. ECG and troponin are reassuring against acute MI, and she is currently chest pain free. Her last stress test in 11/2016 was low risk. Therefore the cause of her progressive symptoms are unclear. Of note, she is significantly more anemic than her baseline, which could be responsible for  worsening angina. -Continue to trend troponin -Continue home DAPT, atenolol, atorvastatin -We will continue to follow along to determine next step for workup  AAA s/p repair -Followed by vascular surgery  HLD -Continue atorvastatin   HTN -Continue home atenolol  For questions or updates, please contact [facility] Please consult [email or web address] for contact info under Cardiology/STEMI.   Signed, Nila Nephew, MD  06/11/2017 11:44 PM

## 2017-06-11 NOTE — ED Triage Notes (Signed)
Pt BIB EMS from [facility] where she is in rehab following back surgery. Pt c/o CP with sudden onset around 1430 today, substernal & non-radiating. Reports mild SOB but sats >96% on RA for EMS. Hx of 4 stents in past. Given 324mg  ASA and 1 Nitro by EMS. BP dropped after receiving nitro so EMS initiated NS bolus. Pain decr'd after nitro as well.

## 2017-06-11 NOTE — ED Notes (Signed)
Patient transported to X-ray 

## 2017-06-11 NOTE — H&P (Addendum)
TRH H&P   Patient Demographics:    [name], is a 72 y.o. female  MRN: [id number]   DOB - [birth year]  Admit Date - 06/11/2017  Outpatient Primary MD for the patient is Biagio Borg, MD  Referring MD/NP/PA: Blinda Leatherwood  Outpatient Specialists:    Curt Jews.  Nadene Rubins  Patient coming from:  [facility]  Chief Complaint  Patient presents with  . Chest Pain      HPI:    [name]  is a 72 y.o. female, w hypertension, hyperlipidemia, Dm2, AAA s/p Gore exlcuder stent graft repair 01/22/2016, CAD s/p IMI 1998 w PCI of RCA, and stent LCx 04/2010, DES to LAD 05/2013, PCI of RCA w DES x3  1012/2017, w recent back surgery (06/14/2017) where she claims she was off aspirin and plavix for 5 days apparently presents with CP .  Pt states that chest pain is substernal,  "Sharp" without radiation. Apparently per pt has been going on for about 1 week intermittent but today seemed worse.  Pt is unclear if nitro helps.  Pt denies fever, chills, cough, palp, sob, n/v, heartburn, diarrhea, brbpr.  In fact she is a very poor historian.   In Ed,  EKG nsr at 65, borderline LAD, no st-t changes c/w ischemia Trop negative  Na 141, K 3.6, Bun 5, Creatinine 0.88 Wbc 7.9, Hgb 7.7, Plt 408 BNP 110.3  D dimer 3.18  Urinalysis pending Cardiology called by ED for consultation, appreciate input  Pt will be admitted for chest pain.       Review of systems:    In addition to the HPI above,  No Fever-chills, No Headache, No changes with Vision or hearing, No problems swallowing food or Liquids, No Cough or Shortness of Breath, No Abdominal pain, No Nausea or Vommitting, Bowel movements are regular, No Blood in stool or Urine, No dysuria, No new skin rashes or bruises, No new joints pains-aches,  No new weakness, tingling, numbness in any extremity, No recent weight  gain or loss, No polyuria, polydypsia or polyphagia, No significant Mental Stressors.  A full 10 point Review of Systems was done, except as stated above, all other Review of Systems were negative.   With Past History of the following :    Past Medical History:  Diagnosis Date  . AAA (abdominal aortic aneurysm) ([place])    a. s/p stent graft repair 01/2016.  [name] Allergic rhinitis, cause unspecified   . Anxiety state, unspecified   . Chronic LBP   . Coronary artery disease    a. inferior MI 1998 s/p PCI of RCA. b. stenting of Cx 04/2010. c. DES to prox LAD 05/2013. d. DES x 3 in 10/2015.  [name] Degeneration of lumbar or lumbosacral intervertebral disc   . Depressive disorder, not elsewhere classified   . Diabetes mellitus    TYPE II  .  Gout 08/22/2013  . Hyperlipidemia   . Hypertension   . Hypothyroidism   . Lower GI bleed 06/2010   Diverticular bleed  . Noncompliance with medications 02/26/2014  . Obesity, unspecified       Past Surgical History:  Procedure Laterality Date  . CARDIAC CATHETERIZATION     PCI OF BOTH THE CIRCUMFLEX AND LEFT ANTERIOR DESCENDING ARTERY  . CARDIAC CATHETERIZATION N/A 10/29/2015   Procedure: Coronary Stent Intervention;  Surgeon: Nelva Bush, MD;  Location: [facility];  Service: Cardiovascular;  Laterality: N/A;  . CARDIAC CATHETERIZATION N/A 10/29/2015   Procedure: Coronary/Graft Angiography;  Surgeon: Nelva Bush, MD;  Location: [facility];  Service: Cardiovascular;  Laterality: N/A;  . CARDIAC CATHETERIZATION N/A 10/29/2015   Procedure: Intravascular Pressure Wire/FFR Study;  Surgeon: Nelva Bush, MD;  Location: [facility];  Service: Cardiovascular;  Laterality: N/A;  . CESAREAN SECTION    . ENDOVASCULAR STENT INSERTION N/A 01/22/2016   Procedure: ABDOMINAL AORTIC ENDOVASCULAR STENT GRAFT INSERTION;  Surgeon: Rosetta Posner, MD;  Location: [facility];  Service: Vascular;  Laterality: N/A;  . HEART STENT  04-2010  and  Jun 07, 2013    X 3  . LEFT HEART CATHETERIZATION WITH CORONARY ANGIOGRAM N/A 06/07/2013   Procedure: LEFT HEART CATHETERIZATION WITH CORONARY ANGIOGRAM;  Surgeon: [name], MD;  Location: [facility] CATH LAB;  Service: Cardiovascular;  Laterality: N/A;  . LEFT HEART CATHETERIZATION WITH CORONARY ANGIOGRAM N/A 02/25/2014   Procedure: LEFT HEART CATHETERIZATION WITH CORONARY ANGIOGRAM;  Surgeon: Troy Sine, MD;  Location: [facility] CATH LAB;  Service: Cardiovascular;  Laterality: N/A;  . LUMBAR FUSION  01/2007   DR. Patrice Paradise...3-LEVEL WITH FIXATION  . OOPHORECTOMY     BSO? pt.unsure  . PARATHYROIDECTOMY    . SPINE SURGERY    . THYROIDECTOMY    . TOTAL ABDOMINAL HYSTERECTOMY        Social History:     Social History   Tobacco Use  . Smoking status: Former Smoker    Years: 30.00    Types: Cigarettes    Last attempt to quit: 05/31/1986    Years since quitting: 31.0  . Smokeless tobacco: Never Used  Substance Use Topics  . Alcohol use: No     Lives - at SNF presently but typically at home  Mobility - walks by self   Family History :     Family History  Problem Relation Age of Onset  . Heart attack Mother 4       s/p D&C-CARDIAC ARREST 1966  . Heart disease Mother   . Heart attack Father 39       1978 WITH MI  . Heart disease Father   . Diabetes Brother   . Diabetes Maternal Aunt   . Arthritis Maternal Aunt   . Breast cancer Maternal Aunt        Post menopausal  . Colon cancer Neg Hx       Home Medications:   Prior to Admission medications   Medication Sig Start Date End Date Taking? Authorizing Provider  aspirin 81 MG EC tablet Take 81 mg by mouth daily.    Yes [provider]  atenolol (TENORMIN) 25 MG tablet TAKE 1 TABLET(25 MG) BY MOUTH TWICE DAILY Patient taking differently: TAKE 1 TABLET(25 MG) BY MOUTH EVERY DAY 04/24/17  Yes Biagio Borg, MD  clopidogrel (PLAVIX) 75 MG tablet Take 1 tablet (75 mg total) by mouth every evening. Patient taking differently: Take  75 mg by mouth daily.  12/19/16  Yes Sherren Mocha, MD  ferrous fumarate-iron polysaccharide complex (TANDEM) 162-115.2 MG CAPS capsule Take 1 capsule by mouth daily with breakfast. For 14 days 06/09/17 06/23/17 Yes [provider]  gabapentin (NEURONTIN) 300 MG capsule Take 1-2 capsules (300-600 mg total) by mouth daily as needed (for pain/neuropathy). 1-2 tab by mouth at bedtime Patient taking differently: Take 600 mg by mouth 3 (three) times daily.  01/22/16  Yes Rhyne, Hulen Shouts, PA-C  loratadine (CLARITIN) 10 MG tablet Take 10 mg by mouth daily.   Yes [provider]  metFORMIN (GLUCOPHAGE-XR) 500 MG 24 hr tablet TAKE 4 TABLETS BY MOUTH EVERY MORNING Patient taking differently: Take 500 mg by mouth daily with breakfast.  01/11/17  Yes Biagio Borg, MD  methocarbamol (ROBAXIN) 500 MG tablet Take 500 mg by mouth 3 (three) times daily.   Yes [provider]  morphine (MS CONTIN) 15 MG 12 hr tablet Take 15 mg by mouth every 12 (twelve) hours. For 7 days ten stop this medication. STOP: 06/16/17. Additional order: 15 mg daily at bedtime for 7 days Start: 06/17/17 Stop: 06/23/17 06/09/17 06/16/17 Yes [provider]  pantoprazole (PROTONIX) 40 MG tablet Take 40 mg by mouth daily.   Yes [provider]  SYNTHROID 150 MCG tablet TAKE 1 TABLET(150 MCG) BY MOUTH DAILY 05/02/17  Yes Biagio Borg, MD  allopurinol (ZYLOPRIM) 300 MG tablet Take 1 tablet (300 mg total) by mouth daily. Patient not taking: Reported on 06/11/2017 04/14/16   Biagio Borg, MD  atorvastatin (LIPITOR) 20 MG tablet Take 1 tablet (20 mg total) by mouth daily. Patient not taking: Reported on 06/11/2017 11/15/16 11/15/17  Biagio Borg, MD  baclofen (LIORESAL) 10 MG tablet Take 1 tablet (10 mg total) by mouth 2 (two) times daily. Patient not taking: Reported on 06/11/2017 11/09/16   Rosemarie Ax, MD  famotidine (PEPCID) 20 MG tablet Take 1 tablet (20 mg total) by mouth daily as needed (for acid  reflux.). Patient not taking: Reported on 06/11/2017 01/22/16   Gabriel Earing, PA-C  furosemide (LASIX) 20 MG tablet Take 1 tablet (20 mg total) by mouth daily as needed. Patient not taking: Reported on 06/11/2017 05/29/17   Biagio Borg, MD  lactulose (CHRONULAC) 10 GM/15ML solution Take 45 mLs (30 g total) by mouth 2 (two) times daily as needed for mild constipation. Patient not taking: Reported on 06/11/2017 03/09/17   Biagio Borg, MD  nitrofurantoin (MACRODANTIN) 50 MG capsule Take 1 capsule (50 mg total) by mouth 4 (four) times daily. Patient not taking: Reported on 06/11/2017 05/31/17   Biagio Borg, MD  nitroGLYCERIN (NITROSTAT) 0.4 MG SL tablet Place 1 tablet (0.4 mg total) under the tongue every 5 (five) minutes as needed for chest pain. Patient not taking: Reported on 06/11/2017 11/09/15   Richardson Dopp T, PA-C  oxyCODONE (ROXICODONE) 5 MG immediate release tablet Take 1 tablet (5 mg total) by mouth every 6 (six) hours as needed for severe pain. Patient not taking: Reported on 06/11/2017 09/23/16   Biagio Borg, MD  tiZANidine (ZANAFLEX) 4 MG tablet Take 1 tablet (4 mg total) by mouth every 6 (six) hours as needed for muscle spasms. Patient not taking: Reported on 06/11/2017 11/15/16   Biagio Borg, MD  Vitamin D, Ergocalciferol, (DRISDOL) 50000 units CAPS capsule Take 1 capsule (50,000 Units total) by mouth every 7 (seven) days. Patient not taking: Reported on 06/11/2017 10/06/15  Lyndal Pulley, DO     Allergies:     Allergies  Allergen Reactions  . Prilosec [Omeprazole] Other (See Comments)    Chest pain  . Miconazole Nitrate Hives    REACTION: hives  . Crestor [Rosuvastatin Calcium] Other (See Comments)    Feeling poor  . Augmentin [Amoxicillin-Pot Clavulanate] Hives, Itching and Rash    Has patient had a PCN reaction causing immediate rash, facial/tongue/throat swelling, SOB or lightheadedness with hypotension:unsure Has patient had a PCN reaction causing severe rash  involving mucus membranes or skin necrosis:unsure Has patient had a PCN reaction that required hospitalization:No Has patient had a PCN reaction occurring within the last 10 years:NO If all of the above answers are "NO", then may proceed with Cephalosporin use. Has patient had a PCN reaction causing immediate rash, facial/tongue/throat swelling, SOB or lightheadedness with hypotension:unsure Has patient had a PCN reaction causing severe rash involving mucus membranes or skin necrosis:unsure Has patient had a PCN reaction that required hospitalization:No Has patient had a PCN reaction occurring within the last 10 years:NO If all of the above answers are "NO", then may proceed with Cephalosporin use.   [name] Doxycycline Other (See Comments)    REACTION: gi upset     Physical Exam:   Vitals  Blood pressure 112/70, pulse 63, temperature 98.4 F (36.9 C), temperature source Oral, resp. rate 11, height 5\' 6"  (1.676 m), weight 96.2 kg (212 lb), SpO2 100 %.   1. General  lying in bed in NAD,   2. Normal affect and insight, Not Suicidal or Homicidal, Awake Alert, Oriented X 3.  3. No F.N deficits, ALL C.Nerves Intact, Strength 5/5 all 4 extremities, Sensation intact all 4 extremities, Plantars down going.  4. Ears and Eyes appear Normal, Conjunctivae clear, PERRLA. Moist Oral Mucosa.  5. Supple Neck, No JVD, No cervical lymphadenopathy appriciated, No Carotid Bruits.  6. Symmetrical Chest wall movement, Good air movement bilaterally, CTAB.  7. RRR, No Gallops, Rubs or Murmurs, No Parasternal Heave.  8. Positive Bowel Sounds, Abdomen Soft, No tenderness, No organomegaly appriciated,No rebound -guarding or rigidity.  9.  No Cyanosis, Normal Skin Turgor, No Skin Rash or Bruise.  10. Good muscle tone,  joints appear normal , no effusions, Normal ROM.  11. No Palpable Lymph Nodes in Neck or Axillae  Slight tenderness to palpation of chest   Data Review:    CBC Recent Labs  Lab  06/11/17 1658  WBC 7.9  HGB 7.7*  HCT 25.6*  PLT 408*  MCV 73.4*  MCH 22.1*  MCHC 30.1  RDW 21.2*   ------------------------------------------------------------------------------------------------------------------  Chemistries  Recent Labs  Lab 06/11/17 1658  NA 141  K 3.6  CL 105  CO2 27  GLUCOSE 105*  BUN 5*  CREATININE 0.88  CALCIUM 7.7*   ------------------------------------------------------------------------------------------------------------------ estimated creatinine clearance is 68.6 mL/min (by C-G formula based on SCr of 0.88 mg/dL). ------------------------------------------------------------------------------------------------------------------ No results for input(s): TSH, T4TOTAL, T3FREE, THYROIDAB in the last 72 hours.  Invalid input(s): FREET3  Coagulation profile No results for input(s): INR, PROTIME in the last 168 hours. ------------------------------------------------------------------------------------------------------------------- Recent Labs    06/11/17 1701  DDIMER 3.18*   -------------------------------------------------------------------------------------------------------------------  Cardiac Enzymes No results for input(s): CKMB, TROPONINI, MYOGLOBIN in the last 168 hours.  Invalid input(s): CK ------------------------------------------------------------------------------------------------------------------    Component Value Date/Time   BNP 110.3 (H) 06/11/2017 1658     ---------------------------------------------------------------------------------------------------------------  Urinalysis    Component Value Date/Time   COLORURINE YELLOW 05/29/2017 [place] 05/29/2017  1503   LABSPEC 1.020 05/29/2017 1503   PHURINE 5.5 05/29/2017 1503   GLUCOSEU NEGATIVE 05/29/2017 1503   HGBUR NEGATIVE 05/29/2017 1503   HGBUR negative 06/22/2007 1400   BILIRUBINUR NEGATIVE 05/29/2017 1503   BILIRUBINUR neg 11/12/2015  1813   KETONESUR NEGATIVE 05/29/2017 1503   PROTEINUR NEGATIVE 02/24/2017 1622   UROBILINOGEN 0.2 05/29/2017 1503   NITRITE POSITIVE (A) 05/29/2017 1503   LEUKOCYTESUR NEGATIVE 05/29/2017 1503    ----------------------------------------------------------------------------------------------------------------   Imaging Results:    Dg Chest 2 View  Result Date: 06/11/2017 CLINICAL DATA:  Pt BIB EMS from [facility] where she is in rehab following back surgery. Pt c/o CP with sudden onset around 1430 today, substernal & non-radiating. Reports mild SOB EXAM: CHEST - 2 VIEW COMPARISON:  06/05/2017 FINDINGS: Cardiac silhouette is normal in size. Right coronary artery stent. No mediastinal or hilar masses. No convincing adenopathy Clear lungs.  No pleural effusion or pneumothorax. There has been a long thoracolumbar fusion with pedicle screws interconnecting rods extending from T4 through the visualized lumbar spine. IMPRESSION: No acute cardiopulmonary disease. Electronically Signed   By: Lajean Manes M.D.   On: 06/11/2017 17:44   Ct Angio Chest Pe W And/or Wo Contrast  Result Date: 06/11/2017 CLINICAL DATA:  Acute onset of generalized chest pain and mild shortness of breath. EXAM: CT ANGIOGRAPHY CHEST WITH CONTRAST TECHNIQUE: Multidetector CT imaging of the chest was performed using the standard protocol during bolus administration of intravenous contrast. Multiplanar CT image reconstructions and MIPs were obtained to evaluate the vascular anatomy. CONTRAST:  192mL ISOVUE-370 IOPAMIDOL (ISOVUE-370) INJECTION 76% COMPARISON:  Chest radiograph performed earlier today at 5:29 p.m., and CT of the abdomen and pelvis performed 02/24/2017 FINDINGS: Cardiovascular: There is no evidence of significant pulmonary embolus. The heart is borderline normal in size. Diffuse coronary artery calcifications are seen. Mild calcification is noted along the aortic arch and proximal great vessels. Mediastinum/Nodes: A  1.3 cm right paratracheal node is noted. A 1.5 cm subcarinal node is suspected. No pericardial effusion is identified. The patient is status post thyroidectomy. No axillary lymphadenopathy is seen. Lungs/Pleura: A trace left-sided pleural effusion is noted. Patchy bilateral atelectasis is noted. No pneumothorax is seen. No masses are identified. Upper Abdomen: A 3.0 cm cyst is noted at the left hepatic lobe, likely mildly increased in size. The visualized portions of the liver and spleen are otherwise unremarkable. The visualized portions of the pancreas, adrenal glands and kidneys are within normal limits. Musculoskeletal: No acute osseous abnormalities are identified. Thoracolumbar spinal fusion rods are noted. There is chronic grade 1 anterolisthesis of T9 on T10. The visualized musculature is unremarkable in appearance. Review of the MIP images confirms the above findings. IMPRESSION: 1. No evidence of significant pulmonary embolus. 2. Trace left-sided pleural effusion. Patchy bilateral atelectasis noted. Lungs otherwise grossly clear. 3. Diffuse coronary artery calcifications. 4. Nonspecific prominence of mediastinal nodes, measuring up to 1.5 cm in short axis. 5. Left hepatic cyst again noted. Electronically Signed   By: Garald Balding M.D.   On: 06/11/2017 20:40       Assessment & Plan:    Active Problems:   Diabetes [facility])   Essential hypertension   Coronary artery disease involving native heart without angina pectoris   Chest pain    Chest pain Tele Trop I q6h x3 Cont aspirin 81mg  po qday Cont plavix 75mg  po qday Cont atenolol 25mg  po qday Increase Lipitor 80mg  po qhs Check lipid  Check cardiac echo NPO after  midnite Cardiology consulted by ED Appreciate input  Dm2 STOP metformin incase needs cardiac cath fsbs ac and qhs, ISS  Anemia likely secondary to recent back surgery Check cbc in am Consider transfusion if <7.5  Back pain Cont gabapentin Percocet 5/325mg  po q6h prn     Gerd Cont pepcid  Hypothyroidism Cont levothyroxine 150 micrograms po qday  Gout Cont allopurinol      DVT Prophylaxis Lovenox - SCDs  AM Labs Ordered, also please review Full Orders  Family Communication: Admission, patients condition and plan of care including tests being ordered have been discussed with the patient who indicate understanding and agree with the plan and Code Status.  Code Status FULL CODE  Likely DC to  [facility]  Condition GUARDED    Consults called: cardiology by ED  Admission status: inpatient  Time spent in minutes : 45   Jani Gravel M.D on 06/11/2017 at 9:51 PM  Between 7am to 7pm - Pager - [phone]  . After 7pm go to [email or web address] - password [facility]  [facility] - Office  [phone]

## 2017-06-12 DIAGNOSIS — I10 Essential (primary) hypertension: Secondary | ICD-10-CM | POA: Diagnosis not present

## 2017-06-12 DIAGNOSIS — E11311 Type 2 diabetes mellitus with unspecified diabetic retinopathy with macular edema: Secondary | ICD-10-CM | POA: Diagnosis not present

## 2017-06-12 DIAGNOSIS — I2510 Atherosclerotic heart disease of native coronary artery without angina pectoris: Secondary | ICD-10-CM | POA: Diagnosis not present

## 2017-06-12 DIAGNOSIS — R079 Chest pain, unspecified: Secondary | ICD-10-CM | POA: Diagnosis not present

## 2017-06-12 DIAGNOSIS — M940 Chondrocostal junction syndrome [Tietze]: Secondary | ICD-10-CM | POA: Diagnosis not present

## 2017-06-12 DIAGNOSIS — R0789 Other chest pain: Secondary | ICD-10-CM | POA: Diagnosis not present

## 2017-06-12 LAB — RETICULOCYTES
RBC.: 3.51 MIL/uL — ABNORMAL LOW (ref 3.87–5.11)
Retic Count, Absolute: 94.8 10*3/uL (ref 19.0–186.0)
Retic Ct Pct: 2.7 % (ref 0.4–3.1)

## 2017-06-12 LAB — COMPREHENSIVE METABOLIC PANEL
ALK PHOS: 82 U/L (ref 38–126)
ALT: 20 U/L (ref 14–54)
AST: 23 U/L (ref 15–41)
Albumin: 2.3 g/dL — ABNORMAL LOW (ref 3.5–5.0)
Anion gap: 11 (ref 5–15)
BUN: 7 mg/dL (ref 6–20)
CALCIUM: 7.6 mg/dL — AB (ref 8.9–10.3)
CO2: 25 mmol/L (ref 22–32)
CREATININE: 0.92 mg/dL (ref 0.44–1.00)
Chloride: 108 mmol/L (ref 101–111)
Glucose, Bld: 121 mg/dL — ABNORMAL HIGH (ref 65–99)
Potassium: 3.4 mmol/L — ABNORMAL LOW (ref 3.5–5.1)
Sodium: 144 mmol/L (ref 135–145)
Total Bilirubin: 0.3 mg/dL (ref 0.3–1.2)
Total Protein: 6 g/dL — ABNORMAL LOW (ref 6.5–8.1)

## 2017-06-12 LAB — CBG MONITORING, ED
GLUCOSE-CAPILLARY: 77 mg/dL (ref 65–99)
GLUCOSE-CAPILLARY: 76 mg/dL (ref 65–99)
Glucose-Capillary: 119 mg/dL — ABNORMAL HIGH (ref 65–99)
Glucose-Capillary: 112 mg/dL — ABNORMAL HIGH (ref 65–99)

## 2017-06-12 LAB — GLUCOSE, CAPILLARY
GLUCOSE-CAPILLARY: 80 mg/dL (ref 65–99)
Glucose-Capillary: 153 mg/dL — ABNORMAL HIGH (ref 65–99)

## 2017-06-12 LAB — URINALYSIS, ROUTINE W REFLEX MICROSCOPIC
BILIRUBIN URINE: NEGATIVE
Glucose, UA: NEGATIVE mg/dL
Hgb urine dipstick: NEGATIVE
KETONES UR: NEGATIVE mg/dL
LEUKOCYTES UA: NEGATIVE
Nitrite: NEGATIVE
PROTEIN: NEGATIVE mg/dL
Specific Gravity, Urine: 1.038 — ABNORMAL HIGH (ref 1.005–1.030)
pH: 5 (ref 5.0–8.0)

## 2017-06-12 LAB — IRON AND TIBC
IRON: 26 ug/dL — AB (ref 28–170)
Saturation Ratios: 10 % — ABNORMAL LOW (ref 10.4–31.8)
TIBC: 266 ug/dL (ref 250–450)
UIBC: 240 ug/dL

## 2017-06-12 LAB — CBC
HCT: 24.9 % — ABNORMAL LOW (ref 36.0–46.0)
Hemoglobin: 7.3 g/dL — ABNORMAL LOW (ref 12.0–15.0)
MCH: 22 pg — AB (ref 26.0–34.0)
MCHC: 29.3 g/dL — ABNORMAL LOW (ref 30.0–36.0)
MCV: 75 fL — ABNORMAL LOW (ref 78.0–100.0)
PLATELETS: 419 10*3/uL — AB (ref 150–400)
RBC: 3.32 MIL/uL — AB (ref 3.87–5.11)
RDW: 21.6 % — AB (ref 11.5–15.5)
WBC: 8.2 10*3/uL (ref 4.0–10.5)

## 2017-06-12 LAB — FOLATE: FOLATE: 5.6 ng/mL — AB (ref >5.9)

## 2017-06-12 LAB — FERRITIN: FERRITIN: 154 ng/mL (ref 11–307)

## 2017-06-12 LAB — TROPONIN I: Troponin I: <0.03 ng/mL (ref <0.03)

## 2017-06-12 LAB — VITAMIN B12: VITAMIN B 12: 100 pg/mL — AB (ref 180–914)

## 2017-06-12 LAB — MRSA PCR SCREENING: MRSA by PCR: NEGATIVE

## 2017-06-12 MED ORDER — POLYETHYLENE GLYCOL 3350 17 G PO PACK
17.0000 g | PACK | Freq: Every day | ORAL | Status: DC
  Administered 2017-06-12 – 2017-06-15 (×4): 17 g via ORAL
  Filled 2017-06-12 (×4): qty 1

## 2017-06-12 MED ORDER — METHOCARBAMOL 500 MG PO TABS
500.0000 mg | ORAL_TABLET | Freq: Three times a day (TID) | ORAL | Status: DC
  Administered 2017-06-12 – 2017-06-15 (×10): 500 mg via ORAL
  Filled 2017-06-12 (×10): qty 1

## 2017-06-12 MED ORDER — MORPHINE SULFATE ER 15 MG PO TBCR
15.0000 mg | EXTENDED_RELEASE_TABLET | Freq: Two times a day (BID) | ORAL | Status: DC
  Administered 2017-06-13 – 2017-06-15 (×5): 15 mg via ORAL
  Filled 2017-06-12 (×5): qty 1

## 2017-06-12 MED ORDER — MORPHINE SULFATE ER 15 MG PO TBCR: 15.0000 mg | EXTENDED_RELEASE_TABLET | Freq: Every day | ORAL | Status: DC

## 2017-06-12 MED ORDER — POLYSACCHARIDE IRON COMPLEX 150 MG PO CAPS
150.0000 mg | ORAL_CAPSULE | Freq: Every day | ORAL | Status: DC
  Administered 2017-06-12 – 2017-06-15 (×4): 150 mg via ORAL
  Filled 2017-06-12 (×4): qty 1

## 2017-06-12 MED ORDER — GABAPENTIN 300 MG PO CAPS
600.0000 mg | ORAL_CAPSULE | Freq: Three times a day (TID) | ORAL | Status: DC
  Administered 2017-06-12 – 2017-06-15 (×10): 600 mg via ORAL
  Filled 2017-06-12 (×11): qty 2

## 2017-06-12 MED ORDER — LEVOTHYROXINE SODIUM 75 MCG PO TABS
150.0000 ug | ORAL_TABLET | Freq: Every day | ORAL | Status: DC
  Administered 2017-06-12 – 2017-06-15 (×4): 150 ug via ORAL
  Filled 2017-06-12 (×4): qty 2

## 2017-06-12 MED ORDER — SENNOSIDES-DOCUSATE SODIUM 8.6-50 MG PO TABS
1.0000 | ORAL_TABLET | Freq: Two times a day (BID) | ORAL | Status: DC
  Administered 2017-06-13 – 2017-06-15 (×5): 1 via ORAL
  Filled 2017-06-12 (×5): qty 1

## 2017-06-12 MED ORDER — ASPIRIN EC 81 MG PO TBEC
81.0000 mg | DELAYED_RELEASE_TABLET | Freq: Every day | ORAL | Status: DC
  Administered 2017-06-12 – 2017-06-15 (×4): 81 mg via ORAL
  Filled 2017-06-12 (×4): qty 1

## 2017-06-12 MED ORDER — MORPHINE SULFATE ER 15 MG PO TBCR
15.0000 mg | EXTENDED_RELEASE_TABLET | Freq: Two times a day (BID) | ORAL | Status: DC
  Administered 2017-06-12 (×2): 15 mg via ORAL
  Filled 2017-06-12 (×2): qty 1

## 2017-06-12 MED ORDER — ATENOLOL 25 MG PO TABS
25.0000 mg | ORAL_TABLET | Freq: Every day | ORAL | Status: DC
  Administered 2017-06-12 – 2017-06-15 (×4): 25 mg via ORAL
  Filled 2017-06-12 (×4): qty 1

## 2017-06-12 MED ORDER — LORATADINE 10 MG PO TABS
10.0000 mg | ORAL_TABLET | Freq: Every day | ORAL | Status: DC
  Administered 2017-06-12 – 2017-06-15 (×4): 10 mg via ORAL
  Filled 2017-06-12 (×4): qty 1

## 2017-06-12 MED ORDER — OXYCODONE HCL 5 MG PO TABS
5.0000 mg | ORAL_TABLET | ORAL | Status: DC | PRN
  Administered 2017-06-12 (×2): 5 mg via ORAL
  Filled 2017-06-12 (×2): qty 1

## 2017-06-12 MED ORDER — INSULIN ASPART 100 UNIT/ML ~~LOC~~ SOLN
0.0000 [IU] | SUBCUTANEOUS | Status: DC
  Administered 2017-06-13: 2 [IU] via SUBCUTANEOUS
  Administered 2017-06-14 (×2): 1 [IU] via SUBCUTANEOUS

## 2017-06-12 MED ORDER — CLOPIDOGREL BISULFATE 75 MG PO TABS
75.0000 mg | ORAL_TABLET | Freq: Every day | ORAL | Status: DC
  Administered 2017-06-12 – 2017-06-15 (×4): 75 mg via ORAL
  Filled 2017-06-12 (×4): qty 1

## 2017-06-12 MED ORDER — PANTOPRAZOLE SODIUM 40 MG PO TBEC
40.0000 mg | DELAYED_RELEASE_TABLET | Freq: Every day | ORAL | Status: DC
  Administered 2017-06-12 – 2017-06-15 (×4): 40 mg via ORAL
  Filled 2017-06-12 (×4): qty 1

## 2017-06-12 NOTE — ED Notes (Signed)
Patient advised she will be moving to [facility] while she waits on a room upstairs.

## 2017-06-12 NOTE — ED Notes (Signed)
CBG checked - 77 mg/dl - no insulin administered at this time as per [facility]; pt sleeping - easily arousable - skin dark, warm, dry

## 2017-06-12 NOTE — Progress Notes (Signed)
PROGRESS NOTE    [name]  AST:[id number] DOB: Jun 12, 1945 DOA: 06/11/2017 PCP: Biagio Borg, MD    Brief Narrative:  [name]  is a 72 y.o. female, w hypertension, hyperlipidemia, Dm2, AAA s/p Gore exlcuder stent graft repair 01/22/2016, CAD s/p IMI 1998 w PCI of RCA, and stent LCx 04/2010, DES to LAD 05/2013, PCI of RCA w DES x3  1012/2017, w recent back surgery (06/14/2017) where she claims she was off aspirin and plavix for 5 days apparently presents with CP .  Pt states that chest pain is substernal,  "Sharp" without radiation. Apparently per pt has been going on for about 1 week intermittent and got worse on the day of admission.    Assessment & Plan:   Active Problems:   Diabetes [facility])   Essential hypertension   Coronary artery disease involving native heart without angina pectoris   Chest pain   Chest pain: ? Atypical  Resolved at this time.  Admitted to rule out ACS.  Resume aspirin 81 mg and plavix 75 mg daily.  Continue with bb and lipitor.  Cardiology consulted and recommendations given.    Type 2 DM: -  CBG (last 3)  Recent Labs    06/12/17 0642 06/12/17 1031 06/12/17 1305  GLUCAP 112* 77 76   Resume SSI.     Anemia Unclear etiology.  Anemia panel ordered.  Stool for occult blood. Transfuse one unit of prbc ordered.    Back pain : Controlled with tramadol.    GERD: Stable.    Hypothyroidism: - resume synthroid.    Gout; Resume allopurinol.    DVT prophylaxis: lovenox.  Code Status:  Full code.  Family Communication: none at bedside.  Disposition Plan: pending further evaluation.    Consultants:  Cardiology.   Procedures: echocardiogram.    Antimicrobials: none.    Subjective: No chest pain. No sob , no nausea or vomiting.   Objective: Vitals:   06/12/17 1430 06/12/17 1445 06/12/17 1500 06/12/17 1515  BP: 137/66 130/81 135/72 (!) 132/59  Pulse: (!) 58 (!) 56 (!) 58 (!) 55  Resp: 17 (!) 21 16 10   Temp:        TempSrc:      SpO2: 100% 99% 100% 100%  Weight:      Height:        Intake/Output Summary (Last 24 hours) at 06/12/2017 1606 Last data filed at 06/12/2017 1109 Gross per 24 hour  Intake 150 ml  Output 900 ml  Net -750 ml   Filed Weights   06/11/17 1654  Weight: 96.2 kg (212 lb)    Examination:  General exam: Appears calm and comfortable  Respiratory system: Clear to auscultation. Respiratory effort normal. Cardiovascular system: S1 & S2 heard, RRR. No JVD, murmurs,  No pedal edema. Gastrointestinal system: Abdomen is non tender soft bs+ Central nervous system: Alert and oriented. No focal neurological deficits. Extremities: Symmetric 5 x 5 power. Skin: No rashes, lesions or ulcers Psychiatry:  Mood & affect appropriate.     Data Reviewed: I have personally reviewed following labs and imaging studies  CBC: Recent Labs  Lab 06/11/17 1658 06/12/17 0446  WBC 7.9 8.2  HGB 7.7* 7.3*  HCT 25.6* 24.9*  MCV 73.4* 75.0*  PLT 408* 989*   Basic Metabolic Panel: Recent Labs  Lab 06/11/17 1658 06/12/17 0446  NA 141 144  K 3.6 3.4*  CL 105 108  CO2 27 25  GLUCOSE 105* 121*  BUN 5* 7  CREATININE  0.88 0.92  CALCIUM 7.7* 7.6*   GFR: Estimated Creatinine Clearance: 65.6 mL/min (by C-G formula based on SCr of 0.92 mg/dL). Liver Function Tests: Recent Labs  Lab 06/12/17 0446  AST 23  ALT 20  ALKPHOS 82  BILITOT 0.3  PROT 6.0*  ALBUMIN 2.3*   No results for input(s): LIPASE, AMYLASE in the last 168 hours. No results for input(s): AMMONIA in the last 168 hours. Coagulation Profile: No results for input(s): INR, PROTIME in the last 168 hours. Cardiac Enzymes: Recent Labs  Lab 06/11/17 2255 06/12/17 0446 06/12/17 1040  TROPONINI <0.03 <0.03 <0.03   BNP (last 3 results) Recent Labs    12/06/16 1137  PROBNP 47   HbA1C: No results for input(s): HGBA1C in the last 72 hours. CBG: Recent Labs  Lab 06/11/17 2253 06/12/17 0219 06/12/17 0642  06/12/17 1031 06/12/17 1305  GLUCAP 66 119* 112* 77 76   Lipid Profile: No results for input(s): CHOL, HDL, LDLCALC, TRIG, CHOLHDL, LDLDIRECT in the last 72 hours. Thyroid Function Tests: No results for input(s): TSH, T4TOTAL, FREET4, T3FREE, THYROIDAB in the last 72 hours. Anemia Panel: No results for input(s): VITAMINB12, FOLATE, FERRITIN, TIBC, IRON, RETICCTPCT in the last 72 hours. Sepsis Labs: No results for input(s): PROCALCITON, LATICACIDVEN in the last 168 hours.  No results found for this or any previous visit (from the past 240 hour(s)).       Radiology Studies: Dg Chest 2 View  Result Date: 06/11/2017 CLINICAL DATA:  Pt BIB EMS from [facility] where she is in rehab following back surgery. Pt c/o CP with sudden onset around 1430 today, substernal & non-radiating. Reports mild SOB EXAM: CHEST - 2 VIEW COMPARISON:  06/05/2017 FINDINGS: Cardiac silhouette is normal in size. Right coronary artery stent. No mediastinal or hilar masses. No convincing adenopathy Clear lungs.  No pleural effusion or pneumothorax. There has been a long thoracolumbar fusion with pedicle screws interconnecting rods extending from T4 through the visualized lumbar spine. IMPRESSION: No acute cardiopulmonary disease. Electronically Signed   By: Lajean Manes M.D.   On: 06/11/2017 17:44   Ct Angio Chest Pe W And/or Wo Contrast  Result Date: 06/11/2017 CLINICAL DATA:  Acute onset of generalized chest pain and mild shortness of breath. EXAM: CT ANGIOGRAPHY CHEST WITH CONTRAST TECHNIQUE: Multidetector CT imaging of the chest was performed using the standard protocol during bolus administration of intravenous contrast. Multiplanar CT image reconstructions and MIPs were obtained to evaluate the vascular anatomy. CONTRAST:  161mL ISOVUE-370 IOPAMIDOL (ISOVUE-370) INJECTION 76% COMPARISON:  Chest radiograph performed earlier today at 5:29 p.m., and CT of the abdomen and pelvis performed 02/24/2017 FINDINGS:  Cardiovascular: There is no evidence of significant pulmonary embolus. The heart is borderline normal in size. Diffuse coronary artery calcifications are seen. Mild calcification is noted along the aortic arch and proximal great vessels. Mediastinum/Nodes: A 1.3 cm right paratracheal node is noted. A 1.5 cm subcarinal node is suspected. No pericardial effusion is identified. The patient is status post thyroidectomy. No axillary lymphadenopathy is seen. Lungs/Pleura: A trace left-sided pleural effusion is noted. Patchy bilateral atelectasis is noted. No pneumothorax is seen. No masses are identified. Upper Abdomen: A 3.0 cm cyst is noted at the left hepatic lobe, likely mildly increased in size. The visualized portions of the liver and spleen are otherwise unremarkable. The visualized portions of the pancreas, adrenal glands and kidneys are within normal limits. Musculoskeletal: No acute osseous abnormalities are identified. Thoracolumbar spinal fusion rods are noted. There is chronic  grade 1 anterolisthesis of T9 on T10. The visualized musculature is unremarkable in appearance. Review of the MIP images confirms the above findings. IMPRESSION: 1. No evidence of significant pulmonary embolus. 2. Trace left-sided pleural effusion. Patchy bilateral atelectasis noted. Lungs otherwise grossly clear. 3. Diffuse coronary artery calcifications. 4. Nonspecific prominence of mediastinal nodes, measuring up to 1.5 cm in short axis. 5. Left hepatic cyst again noted. Electronically Signed   By: Garald Balding M.D.   On: 06/11/2017 20:40        Scheduled Meds: . aspirin EC  81 mg Oral Daily  . atenolol  25 mg Oral Daily  . clopidogrel  75 mg Oral Daily  . enoxaparin (LOVENOX) injection  40 mg Subcutaneous Daily  . gabapentin  600 mg Oral TID  . insulin aspart  0-9 Units Subcutaneous Q4H  . iron polysaccharides  150 mg Oral Q breakfast  . levothyroxine  150 mcg Oral QAC breakfast  . loratadine  10 mg Oral Daily  .  methocarbamol  500 mg Oral TID  . morphine  15 mg Oral Q12H  . pantoprazole  40 mg Oral Daily  . sodium chloride flush  3 mL Intravenous Q12H   Continuous Infusions: . sodium chloride       LOS: 1 day    Time spent: 35 minutes.     Hosie Poisson, MD [facility] Pager [phone]  If 7PM-7AM, please contact night-coverage [email or web address] Password [facility] 06/12/2017, 4:06 PM

## 2017-06-12 NOTE — ED Notes (Signed)
Pt calling for [facility]. Pt was asked, 'Who is [name]?' Pt stated, "[name] is the person working on my car." Pt informed that she is in the hospital. Pt stated, "Okay."

## 2017-06-12 NOTE — ED Notes (Signed)
Pt transferred to inpt bed assignment via stretcher per accepting RN

## 2017-06-12 NOTE — ED Notes (Signed)
Pt transferred to [facility] care at this time - resting quietly on stretcher watching TV - no complaints at this time; plan of care discussed - pt verbalized understanding - CCM SR rate 63 without ectopy - VSS

## 2017-06-12 NOTE — ED Notes (Signed)
Report given to accepting floor - will come to transport pt

## 2017-06-12 NOTE — ED Notes (Signed)
Paged Dr. Maudie Mercury and told him pt is wanting more pain medication and I also told him her pressures are very soft. He was fine with me not giving nothing else but tylenol at this time. He said everything looked fine with her labs and ekg and she would get her pain medication at her scheduled time. Offer her tylenol again.

## 2017-06-13 ENCOUNTER — Inpatient Hospital Stay (HOSPITAL_COMMUNITY): Payer: Medicare Other

## 2017-06-13 DIAGNOSIS — R079 Chest pain, unspecified: Secondary | ICD-10-CM

## 2017-06-13 DIAGNOSIS — D509 Iron deficiency anemia, unspecified: Secondary | ICD-10-CM | POA: Diagnosis present

## 2017-06-13 DIAGNOSIS — I10 Essential (primary) hypertension: Secondary | ICD-10-CM

## 2017-06-13 DIAGNOSIS — E119 Type 2 diabetes mellitus without complications: Secondary | ICD-10-CM | POA: Diagnosis not present

## 2017-06-13 DIAGNOSIS — R072 Precordial pain: Secondary | ICD-10-CM

## 2017-06-13 DIAGNOSIS — E782 Mixed hyperlipidemia: Secondary | ICD-10-CM | POA: Diagnosis not present

## 2017-06-13 DIAGNOSIS — I2510 Atherosclerotic heart disease of native coronary artery without angina pectoris: Secondary | ICD-10-CM | POA: Diagnosis not present

## 2017-06-13 DIAGNOSIS — D529 Folate deficiency anemia, unspecified: Secondary | ICD-10-CM | POA: Diagnosis not present

## 2017-06-13 DIAGNOSIS — M940 Chondrocostal junction syndrome [Tietze]: Secondary | ICD-10-CM | POA: Diagnosis not present

## 2017-06-13 DIAGNOSIS — D519 Vitamin B12 deficiency anemia, unspecified: Secondary | ICD-10-CM | POA: Diagnosis not present

## 2017-06-13 DIAGNOSIS — R0789 Other chest pain: Secondary | ICD-10-CM | POA: Diagnosis not present

## 2017-06-13 HISTORY — DX: Iron deficiency anemia, unspecified: D50.9

## 2017-06-13 LAB — PREPARE RBC (CROSSMATCH)

## 2017-06-13 LAB — GLUCOSE, CAPILLARY
GLUCOSE-CAPILLARY: 101 mg/dL — AB (ref 65–99)
GLUCOSE-CAPILLARY: 103 mg/dL — AB (ref 65–99)
GLUCOSE-CAPILLARY: 111 mg/dL — AB (ref 65–99)
GLUCOSE-CAPILLARY: 148 mg/dL — AB (ref 65–99)
GLUCOSE-CAPILLARY: 168 mg/dL — AB (ref 65–99)
Glucose-Capillary: 109 mg/dL — ABNORMAL HIGH (ref 65–99)

## 2017-06-13 LAB — BASIC METABOLIC PANEL
ANION GAP: 8 (ref 5–15)
BUN: 8 mg/dL (ref 6–20)
CALCIUM: 7.5 mg/dL — AB (ref 8.9–10.3)
CO2: 26 mmol/L (ref 22–32)
CREATININE: 0.93 mg/dL (ref 0.44–1.00)
Chloride: 109 mmol/L (ref 101–111)
Glucose, Bld: 99 mg/dL (ref 65–99)
Potassium: 4.2 mmol/L (ref 3.5–5.1)
Sodium: 143 mmol/L (ref 135–145)

## 2017-06-13 LAB — ECHOCARDIOGRAM COMPLETE
HEIGHTINCHES: 65 in
WEIGHTICAEL: 3682.56 [oz_av]

## 2017-06-13 LAB — URINE CULTURE: Culture: NO GROWTH

## 2017-06-13 LAB — CBC
HCT: 26.3 % — ABNORMAL LOW (ref 36.0–46.0)
HCT: 31.2 % — ABNORMAL LOW (ref 36.0–46.0)
HEMOGLOBIN: 9.3 g/dL — AB (ref 12.0–15.0)
Hemoglobin: 7.7 g/dL — ABNORMAL LOW (ref 12.0–15.0)
MCH: 21.9 pg — ABNORMAL LOW (ref 26.0–34.0)
MCH: 22.7 pg — ABNORMAL LOW (ref 26.0–34.0)
MCHC: 29.3 g/dL — AB (ref 30.0–36.0)
MCHC: 29.8 g/dL — ABNORMAL LOW (ref 30.0–36.0)
MCV: 74.9 fL — ABNORMAL LOW (ref 78.0–100.0)
MCV: 76.3 fL — ABNORMAL LOW (ref 78.0–100.0)
PLATELETS: 466 10*3/uL — AB (ref 150–400)
PLATELETS: 508 10*3/uL — AB (ref 150–400)
RBC: 3.51 MIL/uL — ABNORMAL LOW (ref 3.87–5.11)
RBC: 4.09 MIL/uL (ref 3.87–5.11)
RDW: 22.7 % — AB (ref 11.5–15.5)
RDW: 23.5 % — ABNORMAL HIGH (ref 11.5–15.5)
WBC: 7.9 10*3/uL (ref 4.0–10.5)
WBC: 9.5 10*3/uL (ref 4.0–10.5)

## 2017-06-13 MED ORDER — VITAMIN B-12 1000 MCG PO TABS
1000.0000 ug | ORAL_TABLET | Freq: Every day | ORAL | Status: DC
  Administered 2017-06-13 – 2017-06-15 (×3): 1000 ug via ORAL
  Filled 2017-06-13 (×3): qty 1

## 2017-06-13 MED ORDER — CYANOCOBALAMIN 1000 MCG/ML IJ SOLN
1000.0000 ug | Freq: Once | INTRAMUSCULAR | Status: AC
  Administered 2017-06-13: 1000 ug via INTRAMUSCULAR
  Filled 2017-06-13: qty 1

## 2017-06-13 MED ORDER — SODIUM CHLORIDE 0.9 % IV SOLN
Freq: Once | INTRAVENOUS | Status: AC
  Administered 2017-06-13: 14:00:00 via INTRAVENOUS

## 2017-06-13 MED ORDER — FOLIC ACID 1 MG PO TABS
1.0000 mg | ORAL_TABLET | Freq: Every day | ORAL | Status: DC
  Administered 2017-06-13 – 2017-06-15 (×3): 1 mg via ORAL
  Filled 2017-06-13 (×3): qty 1

## 2017-06-13 MED ORDER — OXYCODONE HCL 5 MG PO TABS
5.0000 mg | ORAL_TABLET | Freq: Three times a day (TID) | ORAL | Status: DC | PRN
  Administered 2017-06-14 – 2017-06-15 (×2): 5 mg via ORAL
  Filled 2017-06-13 (×2): qty 1

## 2017-06-13 NOTE — Clinical Social Work Note (Signed)
Clinical Social Work Assessment  Patient Details  Name: [name] MRN: [id number] Date of Birth: 07-30-1945  Date of referral:  06/13/17               Reason for consult:  Discharge Planning, Facility Placement                Permission sought to share information with:  Facility Sport and exercise psychologist, Family Supports Permission granted to share information::  Yes, Verbal Permission Granted  Name::     [name]  Agency::  SNFs  Relationship::  daughter  Contact Information:  [phone]  Housing/Transportation Living arrangements for the past 2 months:  [name], [name] of Information:  Patient Patient Interpreter Needed:  None Criminal Activity/Legal Involvement Pertinent to Current Situation/Hospitalization:  No - Comment as needed Significant Relationships:  Adult Children, Friend Lives with:  Self Do you feel safe going back to the place where you live?  Yes Need for family participation in patient care:  No (Coment)  Care giving concerns: Patient from [name] for rehab. Prior to that was admitted at [name]. Back surgery in January.    Social Worker assessment / plan: CSW met with patient at bedside. Patient alert and oriented. CSW introduced self and role and discussed disposition planning. Patient indicated she had only been at [name] for two days before admitting at [name]. Patient stated she does not want to return to [name] but does want to continue rehab at a different facility. Patient gave permission to speak to her daughter, [name].   CSW paged MD for PT/OT orders. Patient will need evaluation and [name] authorization before admitting to a facility. CSW to send out initial referrals after therapy evaluations and will support with discharge planning.  Employment status:  Retired Research officer, political party) PT Recommendations:  Not assessed at this time Information / Referral to  community resources:  [name]  Patient/Family's Response to care: Patient appreciative of care.  Patient/Family's Understanding of and Emotional Response to Diagnosis, Current Treatment, and Prognosis: Patient with understanding of her condition and hopeful to go to a new SNF for rehab.  Emotional Assessment Appearance:  Appears stated age Attitude/Demeanor/Rapport:  Engaged Affect (typically observed):  Accepting, Appropriate, Calm Orientation:  Oriented to Self, Oriented to Place, Oriented to  Time, Oriented to Situation Alcohol / Substance use:  Not Applicable Psych involvement (Current and /or in the community):  No (Comment)  Discharge Needs  Concerns to be addressed:  Care Coordination, Discharge Planning Concerns Readmission within the last 30 days:  Yes Current discharge risk:  Physical Impairment Barriers to Discharge:  Continued Medical Work up   Estanislado Emms, LCSW 06/13/2017, 3:01 PM

## 2017-06-13 NOTE — Progress Notes (Addendum)
Progress Note  Patient Name: [name] Date of Encounter: 06/13/2017  Primary Cardiologist: Sherren Mocha, MD  Subjective   Having intermittent episodes of "feeling like something is trying to push out of her chest."   Inpatient Medications    Scheduled Meds: . aspirin EC  81 mg Oral Daily  . atenolol  25 mg Oral Daily  . clopidogrel  75 mg Oral Daily  . enoxaparin (LOVENOX) injection  40 mg Subcutaneous Daily  . gabapentin  600 mg Oral TID  . insulin aspart  0-9 Units Subcutaneous Q4H  . iron polysaccharides  150 mg Oral Q breakfast  . levothyroxine  150 mcg Oral QAC breakfast  . loratadine  10 mg Oral Daily  . methocarbamol  500 mg Oral TID  . morphine  15 mg Oral Q12H   Followed by  . [START ON 06/17/2017] morphine  15 mg Oral QHS  . pantoprazole  40 mg Oral Daily  . polyethylene glycol  17 g Oral Daily  . senna-docusate  1 tablet Oral BID  . sodium chloride flush  3 mL Intravenous Q12H   Continuous Infusions: . sodium chloride     PRN Meds: sodium chloride, acetaminophen **OR** acetaminophen, oxyCODONE, sodium chloride flush   Vital Signs    Vitals:   06/12/17 2339 06/13/17 0100 06/13/17 0417 06/13/17 0831  BP: (!) 92/50 (!) 96/52 (!) 102/57 133/65  Pulse:   64 71  Resp:   16   Temp:   98 F (36.7 C)   TempSrc:   Oral   SpO2:      Weight:   230 lb 2.6 oz (104.4 kg)   Height:        Intake/Output Summary (Last 24 hours) at 06/13/2017 0959 Last data filed at 06/13/2017 0006 Gross per 24 hour  Intake 480 ml  Output 1300 ml  Net -820 ml   Filed Weights   06/11/17 1654 06/12/17 1612 06/13/17 0417  Weight: 212 lb (96.2 kg) 225 lb 8.5 oz (102.3 kg) 230 lb 2.6 oz (104.4 kg)    Telemetry    SR - Personally Reviewed  Physical Exam   General: Well developed, well nourished, older AA female appearing in no acute distress. Head: Normocephalic, atraumatic.  Neck: Supple without bruits, JVD. Lungs:  Resp regular and unlabored, CTA. Heart: RRR, S1,  S2, no murmur; no rub. Abdomen: Soft, non-tender, non-distended with normoactive bowel sounds.  Extremities: No clubbing, cyanosis, edema. Distal pedal pulses are 2+ bilaterally. Neuro: Alert and oriented X 3. Moves all extremities spontaneously. Psych: Normal affect.  Labs    Chemistry Recent Labs  Lab 06/11/17 1658 06/12/17 0446 06/13/17 0714  NA 141 144 143  K 3.6 3.4* 4.2  CL 105 108 109  CO2 27 25 26   GLUCOSE 105* 121* 99  BUN 5* 7 8  CREATININE 0.88 0.92 0.93  CALCIUM 7.7* 7.6* 7.5*  PROT  --  6.0*  --   ALBUMIN  --  2.3*  --   AST  --  23  --   ALT  --  20  --   ALKPHOS  --  82  --   BILITOT  --  0.3  --   GFRNONAA >60 >60 >60  GFRAA >60 >60 >60  ANIONGAP 9 11 8      Hematology Recent Labs  Lab 06/11/17 1658 06/12/17 0446 06/12/17 1629 06/13/17 0714  WBC 7.9 8.2  --  7.9  RBC 3.49* 3.32* 3.51* 3.51*  HGB 7.7* 7.3*  --  7.7*  HCT 25.6* 24.9*  --  26.3*  MCV 73.4* 75.0*  --  74.9*  MCH 22.1* 22.0*  --  21.9*  MCHC 30.1 29.3*  --  29.3*  RDW 21.2* 21.6*  --  22.7*  PLT 408* 419*  --  466*    Cardiac Enzymes Recent Labs  Lab 06/11/17 2255 06/12/17 0446 06/12/17 1040  TROPONINI <0.03 <0.03 <0.03    Recent Labs  Lab 06/11/17 1706 06/11/17 1958  TROPIPOC 0.00 0.03     BNP Recent Labs  Lab 06/11/17 1658  BNP 110.3*     DDimer  Recent Labs  Lab 06/11/17 1701  DDIMER 3.18*      Radiology    Dg Chest 2 View  Result Date: 06/11/2017 CLINICAL DATA:  Pt BIB EMS from [facility] where she is in rehab following back surgery. Pt c/o CP with sudden onset around 1430 today, substernal & non-radiating. Reports mild SOB EXAM: CHEST - 2 VIEW COMPARISON:  06/05/2017 FINDINGS: Cardiac silhouette is normal in size. Right coronary artery stent. No mediastinal or hilar masses. No convincing adenopathy Clear lungs.  No pleural effusion or pneumothorax. There has been a long thoracolumbar fusion with pedicle screws interconnecting rods extending  from T4 through the visualized lumbar spine. IMPRESSION: No acute cardiopulmonary disease. Electronically Signed   By: Lajean Manes M.D.   On: 06/11/2017 17:44   Ct Angio Chest Pe W And/or Wo Contrast  Result Date: 06/11/2017 CLINICAL DATA:  Acute onset of generalized chest pain and mild shortness of breath. EXAM: CT ANGIOGRAPHY CHEST WITH CONTRAST TECHNIQUE: Multidetector CT imaging of the chest was performed using the standard protocol during bolus administration of intravenous contrast. Multiplanar CT image reconstructions and MIPs were obtained to evaluate the vascular anatomy. CONTRAST:  169mL ISOVUE-370 IOPAMIDOL (ISOVUE-370) INJECTION 76% COMPARISON:  Chest radiograph performed earlier today at 5:29 p.m., and CT of the abdomen and pelvis performed 02/24/2017 FINDINGS: Cardiovascular: There is no evidence of significant pulmonary embolus. The heart is borderline normal in size. Diffuse coronary artery calcifications are seen. Mild calcification is noted along the aortic arch and proximal great vessels. Mediastinum/Nodes: A 1.3 cm right paratracheal node is noted. A 1.5 cm subcarinal node is suspected. No pericardial effusion is identified. The patient is status post thyroidectomy. No axillary lymphadenopathy is seen. Lungs/Pleura: A trace left-sided pleural effusion is noted. Patchy bilateral atelectasis is noted. No pneumothorax is seen. No masses are identified. Upper Abdomen: A 3.0 cm cyst is noted at the left hepatic lobe, likely mildly increased in size. The visualized portions of the liver and spleen are otherwise unremarkable. The visualized portions of the pancreas, adrenal glands and kidneys are within normal limits. Musculoskeletal: No acute osseous abnormalities are identified. Thoracolumbar spinal fusion rods are noted. There is chronic grade 1 anterolisthesis of T9 on T10. The visualized musculature is unremarkable in appearance. Review of the MIP images confirms the above findings.  IMPRESSION: 1. No evidence of significant pulmonary embolus. 2. Trace left-sided pleural effusion. Patchy bilateral atelectasis noted. Lungs otherwise grossly clear. 3. Diffuse coronary artery calcifications. 4. Nonspecific prominence of mediastinal nodes, measuring up to 1.5 cm in short axis. 5. Left hepatic cyst again noted. Electronically Signed   By: Garald Balding M.D.   On: 06/11/2017 20:40    Cardiac Studies   TTE: pending   Patient Profile     72 y.o. female with a hx of CAD (inferior MI 1998 s/p PCI of RCA, stenting of Cx 04/2010, DES to  prox LAD 05/2013, DES x 3 in 10/2015), AAA s/p stent graft repair in 01/2016, anxiety, chronic LBP, DM, depression, HTN, HLD, hypothyroidism, prior lower GIB/diverticular bleed 2012, obesity, former tobacco (30-40 yrs) who presented for the evaluation of chest pain.   Assessment & Plan    1. Chest pain: Does have a prior hx of CAD with PCI. Chest pain described is atypical and feels like something pushing out from inside her chest. She is tender with palpation. Trop neg x3. D-dimer was elevated but CTA neg for PE.   -- echo pending -- recheck EKG this morning  2. Anemia: denies any blood in her stools or urine prior to admission. Hgb 7.7>>7.3>>7.7. Noted to receive a transfusion per admitting, but does not appear that she actually received this. Would transfuse Hgb <8. Suspect this could be contributing to her chest pain. Hx of GI bleed in the past.   3. AAA s/p repair: followed by [facility]  4. HL: on Lipitor  5. HTN: controlled on BB, actually soft at times.    Signed, Reino Bellis, NP  06/13/2017, 9:59 AM  Pager # [phone]   I have examined the patient and reviewed assessment and plan and discussed with patient.  Agree with above as stated.  Significant anemia.  Plan echo.  Atypical CP persists with negative troponin. Pain to palpation of the chest wall.  Larae Grooms   For questions or updates, please contact [facility] Please  consult [email or web address] for contact info under Cardiology/STEMI.

## 2017-06-13 NOTE — Progress Notes (Signed)
SBP running low-automatic 88 and manual 90 to 95. Had been 1 teens to 130's. Pt is asymptomatic/alerrt and oriented, Pt has received no BP meds this shift. Pt did receive Oxy IR 5 mg at 1730 and 2300. Provider on call notified, no new orders at this time. Jessie Foot, RN

## 2017-06-13 NOTE — Progress Notes (Signed)
PROGRESS NOTE    [name]  QQI:[id number] DOB: 12-07-1945 DOA: 06/11/2017 PCP: Biagio Borg, MD    Brief Narrative:  Swati Granberry  is a 72 y.o. female, w hypertension, hyperlipidemia, Dm2, AAA s/p Gore exlcuder stent graft repair 01/22/2016, CAD s/p IMI 1998 w PCI of RCA, and stent LCx 04/2010, DES to LAD 05/2013, PCI of RCA w DES x3  1012/2017, w recent back surgery  where she claims she was off aspirin and plavix for 5 days apparently presents with CP .  Pt states that chest pain is substernal,  "Sharp" without radiation. Apparently per pt has been going on for about 1 week intermittent and got worse on the day of admission.    Assessment & Plan:   Active Problems:   Diabetes [facility])   Essential hypertension   Coronary artery disease involving native heart without angina pectoris   Chest pain   Chest pain: ? Atypical . Chest wall tenderness on palpation. Denies any sob or any relation to activity. No radiation.  Resolved at this time.  Admitted to rule out ACS.  troponin's negative. EKG NSR, without any signs of ischemia. Over night telemetry unremarkable. ACS ruled out.  Elevated d dimer on admission, with negative CTA for pulmonary embolism.  Resume aspirin 81 mg and plavix 75 mg daily.  Continue with bb and lipitor.  Cardiology consulted and recommendations given.     H/o CAD S/P PCI:  Resume home meds. And cardiology on board for recommendations.     AAA:  Outpatient follow up with vascular surgery as recommended.     Type 2 DM: -  CBG (last 3)  Recent Labs    06/13/17 0003 06/13/17 0414 06/13/17 0738  GLUCAP 103* 109* 111*   Resume SSI. No changes in medications.     Anemia microcytic,  Unclear etiology. Patient has a h/o diverticulosis. But she denies any hematochezia or malena.  She underwent EGD IN 2012 which was esentially wnl.  Her colonoscopy showed diverticulosis in 2012.  Anemia panel showed low iron levels and low folic acid and low H41  levels.  Stool for occult blood pending.  1 unit of prbc transfusion today.  Repeat post transfusion H&H today and monitor hemoglobin. Keep hemoglobin greater than 8.  If her stool for occult blood is positive, may warrant a GI consult.   Thrombocytosis: Suspect possibly from the anemia.     Back pain : Controlled with tramadol.    GERD: Stable.    Hypothyroidism: - resume synthroid.    Gout; Resume allopurinol.    H/o Back pain with recent surgery/ chronic pain syndrome : Currently on MS Contin and oxy IR for breakthrough pain.    Hypertension; well controlled today.     DVT prophylaxis: lovenox.  Code Status:  Full code.  Family Communication: none at bedside.  Disposition Plan: pending evaluation of the anemia.    Consultants:  Cardiology.    Procedures: Echocardiogram.    Antimicrobials: none.    Subjective: Chest wall tenderness in the sternal area on palpation. No nausea, vomiting or abdominal pain.   Objective: Vitals:   06/12/17 2339 06/13/17 0100 06/13/17 0417 06/13/17 0831  BP: (!) 92/50 (!) 96/52 (!) 102/57 133/65  Pulse:   64 71  Resp:   16   Temp:   98 F (36.7 C)   TempSrc:   Oral   SpO2:      Weight:   104.4 kg (230 lb 2.6 oz)  Height:        Intake/Output Summary (Last 24 hours) at 06/13/2017 1003 Last data filed at 06/13/2017 0006 Gross per 24 hour  Intake 480 ml  Output 1300 ml  Net -820 ml   Filed Weights   06/11/17 1654 06/12/17 1612 06/13/17 0417  Weight: 96.2 kg (212 lb) 102.3 kg (225 lb 8.5 oz) 104.4 kg (230 lb 2.6 oz)    Examination:  General exam: Appears calm and comfortable, not in any kind of distress Respiratory system: Clear to auscultation. Respiratory effort normal. No wheezing or rhonchi.  Cardiovascular system: S1 & S2 heard, RRR. No JVD, murmurs,  No pedal edema. Gastrointestinal system: Abdomen is soft non tender non distended bowel sounds good.  Central nervous system: Alert and oriented. Non  focal.  Extremities: Symmetric 5 x 5 power. Skin: No rashes, lesions or ulcers Psychiatry:  Mood & affect appropriate.     Data Reviewed: I have personally reviewed following labs and imaging studies  CBC: Recent Labs  Lab 06/11/17 1658 06/12/17 0446 06/13/17 0714  WBC 7.9 8.2 7.9  HGB 7.7* 7.3* 7.7*  HCT 25.6* 24.9* 26.3*  MCV 73.4* 75.0* 74.9*  PLT 408* 419* 270*   Basic Metabolic Panel: Recent Labs  Lab 06/11/17 1658 06/12/17 0446 06/13/17 0714  NA 141 144 143  K 3.6 3.4* 4.2  CL 105 108 109  CO2 27 25 26   GLUCOSE 105* 121* 99  BUN 5* 7 8  CREATININE 0.88 0.92 0.93  CALCIUM 7.7* 7.6* 7.5*   GFR: Estimated Creatinine Clearance: 66.6 mL/min (by C-G formula based on SCr of 0.93 mg/dL). Liver Function Tests: Recent Labs  Lab 06/12/17 0446  AST 23  ALT 20  ALKPHOS 82  BILITOT 0.3  PROT 6.0*  ALBUMIN 2.3*   No results for input(s): LIPASE, AMYLASE in the last 168 hours. No results for input(s): AMMONIA in the last 168 hours. Coagulation Profile: No results for input(s): INR, PROTIME in the last 168 hours. Cardiac Enzymes: Recent Labs  Lab 06/11/17 2255 06/12/17 0446 06/12/17 1040  TROPONINI <0.03 <0.03 <0.03   BNP (last 3 results) Recent Labs    12/06/16 1137  PROBNP 47   HbA1C: No results for input(s): HGBA1C in the last 72 hours. CBG: Recent Labs  Lab 06/12/17 1633 06/12/17 2043 06/13/17 0003 06/13/17 0414 06/13/17 0738  GLUCAP 80 153* 103* 109* 111*   Lipid Profile: No results for input(s): CHOL, HDL, LDLCALC, TRIG, CHOLHDL, LDLDIRECT in the last 72 hours. Thyroid Function Tests: No results for input(s): TSH, T4TOTAL, FREET4, T3FREE, THYROIDAB in the last 72 hours. Anemia Panel: Recent Labs    06/12/17 1629  VITAMINB12 100*  FOLATE 5.6*  FERRITIN 154  TIBC 266  IRON 26*  RETICCTPCT 2.7   Sepsis Labs: No results for input(s): PROCALCITON, LATICACIDVEN in the last 168 hours.  Recent Results (from the past 240 hour(s))    Urine culture     Status: None   Collection Time: 06/11/17  3:45 AM  Result Value Ref Range Status   Specimen Description URINE, RANDOM  Final   Special Requests NONE  Final   Culture   Final    NO GROWTH Performed at [facility], [address], [place], [place] [address]    Report Status 06/13/2017 FINAL  Final  MRSA PCR Screening     Status: None   Collection Time: 06/12/17  5:18 PM  Result Value Ref Range Status   MRSA by PCR NEGATIVE NEGATIVE Final  Comment:        The GeneXpert MRSA Assay (FDA approved for NASAL specimens only), is one component of a comprehensive MRSA colonization surveillance program. It is not intended to diagnose MRSA infection nor to guide or monitor treatment for MRSA infections. Performed at [facility], [place] [address] [place], [place] [address]          Radiology Studies: Dg Chest 2 View  Result Date: 06/11/2017 CLINICAL DATA:  Pt BIB EMS from [facility] where she is in rehab following back surgery. Pt c/o CP with sudden onset around 1430 today, substernal & non-radiating. Reports mild SOB EXAM: CHEST - 2 VIEW COMPARISON:  06/05/2017 FINDINGS: Cardiac silhouette is normal in size. Right coronary artery stent. No mediastinal or hilar masses. No convincing adenopathy Clear lungs.  No pleural effusion or pneumothorax. There has been a long thoracolumbar fusion with pedicle screws interconnecting rods extending from T4 through the visualized lumbar spine. IMPRESSION: No acute cardiopulmonary disease. Electronically Signed   By: Lajean Manes M.D.   On: 06/11/2017 17:44   Ct Angio Chest Pe W And/or Wo Contrast  Result Date: 06/11/2017 CLINICAL DATA:  Acute onset of generalized chest pain and mild shortness of breath. EXAM: CT ANGIOGRAPHY CHEST WITH CONTRAST TECHNIQUE: Multidetector CT imaging of the chest was performed using the standard protocol during bolus administration of intravenous contrast. Multiplanar CT image  reconstructions and MIPs were obtained to evaluate the vascular anatomy. CONTRAST:  117mL ISOVUE-370 IOPAMIDOL (ISOVUE-370) INJECTION 76% COMPARISON:  Chest radiograph performed earlier today at 5:29 p.m., and CT of the abdomen and pelvis performed 02/24/2017 FINDINGS: Cardiovascular: There is no evidence of significant pulmonary embolus. The heart is borderline normal in size. Diffuse coronary artery calcifications are seen. Mild calcification is noted along the aortic arch and proximal great vessels. Mediastinum/Nodes: A 1.3 cm right paratracheal node is noted. A 1.5 cm subcarinal node is suspected. No pericardial effusion is identified. The patient is status post thyroidectomy. No axillary lymphadenopathy is seen. Lungs/Pleura: A trace left-sided pleural effusion is noted. Patchy bilateral atelectasis is noted. No pneumothorax is seen. No masses are identified. Upper Abdomen: A 3.0 cm cyst is noted at the left hepatic lobe, likely mildly increased in size. The visualized portions of the liver and spleen are otherwise unremarkable. The visualized portions of the pancreas, adrenal glands and kidneys are within normal limits. Musculoskeletal: No acute osseous abnormalities are identified. Thoracolumbar spinal fusion rods are noted. There is chronic grade 1 anterolisthesis of T9 on T10. The visualized musculature is unremarkable in appearance. Review of the MIP images confirms the above findings. IMPRESSION: 1. No evidence of significant pulmonary embolus. 2. Trace left-sided pleural effusion. Patchy bilateral atelectasis noted. Lungs otherwise grossly clear. 3. Diffuse coronary artery calcifications. 4. Nonspecific prominence of mediastinal nodes, measuring up to 1.5 cm in short axis. 5. Left hepatic cyst again noted. Electronically Signed   By: Garald Balding M.D.   On: 06/11/2017 20:40        Scheduled Meds: . aspirin EC  81 mg Oral Daily  . atenolol  25 mg Oral Daily  . clopidogrel  75 mg Oral Daily  .  cyanocobalamin  1,000 mcg Intramuscular Once  . enoxaparin (LOVENOX) injection  40 mg Subcutaneous Daily  . folic acid  1 mg Oral Daily  . gabapentin  600 mg Oral TID  . insulin aspart  0-9 Units Subcutaneous Q4H  . iron polysaccharides  150 mg Oral Q breakfast  . levothyroxine  150 mcg Oral QAC breakfast  . loratadine  10 mg Oral Daily  . methocarbamol  500 mg Oral TID  . morphine  15 mg Oral Q12H   Followed by  . [START ON 06/17/2017] morphine  15 mg Oral QHS  . pantoprazole  40 mg Oral Daily  . polyethylene glycol  17 g Oral Daily  . senna-docusate  1 tablet Oral BID  . sodium chloride flush  3 mL Intravenous Q12H  . vitamin B-12  1,000 mcg Oral Daily   Continuous Infusions: . sodium chloride    . sodium chloride       LOS: 2 days    Time spent: 35 minutes.     Hosie Poisson, MD [facility] Pager [phone]  If 7PM-7AM, please contact night-coverage [email or web address] Password [email or web address] 06/13/2017, 10:03 AM

## 2017-06-14 DIAGNOSIS — E11311 Type 2 diabetes mellitus with unspecified diabetic retinopathy with macular edema: Secondary | ICD-10-CM

## 2017-06-14 DIAGNOSIS — Z95828 Presence of other vascular implants and grafts: Secondary | ICD-10-CM

## 2017-06-14 DIAGNOSIS — D509 Iron deficiency anemia, unspecified: Secondary | ICD-10-CM

## 2017-06-14 DIAGNOSIS — R072 Precordial pain: Secondary | ICD-10-CM | POA: Diagnosis not present

## 2017-06-14 DIAGNOSIS — I25118 Atherosclerotic heart disease of native coronary artery with other forms of angina pectoris: Secondary | ICD-10-CM | POA: Diagnosis not present

## 2017-06-14 DIAGNOSIS — I2510 Atherosclerotic heart disease of native coronary artery without angina pectoris: Secondary | ICD-10-CM | POA: Diagnosis not present

## 2017-06-14 DIAGNOSIS — R0789 Other chest pain: Secondary | ICD-10-CM | POA: Diagnosis not present

## 2017-06-14 DIAGNOSIS — E039 Hypothyroidism, unspecified: Secondary | ICD-10-CM | POA: Diagnosis not present

## 2017-06-14 DIAGNOSIS — M940 Chondrocostal junction syndrome [Tietze]: Secondary | ICD-10-CM | POA: Diagnosis not present

## 2017-06-14 DIAGNOSIS — M109 Gout, unspecified: Secondary | ICD-10-CM | POA: Diagnosis not present

## 2017-06-14 DIAGNOSIS — M545 Low back pain: Secondary | ICD-10-CM | POA: Diagnosis not present

## 2017-06-14 DIAGNOSIS — E782 Mixed hyperlipidemia: Secondary | ICD-10-CM

## 2017-06-14 DIAGNOSIS — I10 Essential (primary) hypertension: Secondary | ICD-10-CM | POA: Diagnosis not present

## 2017-06-14 LAB — TYPE AND SCREEN
ABO/RH(D): O POS
ANTIBODY SCREEN: NEGATIVE
Unit division: 0

## 2017-06-14 LAB — GLUCOSE, CAPILLARY
GLUCOSE-CAPILLARY: 114 mg/dL — AB (ref 65–99)
GLUCOSE-CAPILLARY: 144 mg/dL — AB (ref 65–99)
GLUCOSE-CAPILLARY: 89 mg/dL (ref 65–99)
Glucose-Capillary: 112 mg/dL — ABNORMAL HIGH (ref 65–99)
Glucose-Capillary: 131 mg/dL — ABNORMAL HIGH (ref 65–99)
Glucose-Capillary: 136 mg/dL — ABNORMAL HIGH (ref 65–99)
Glucose-Capillary: 137 mg/dL — ABNORMAL HIGH (ref 65–99)

## 2017-06-14 LAB — BPAM RBC
Blood Product Expiration Date: 201906032359
ISSUE DATE / TIME: 201905281407
Unit Type and Rh: 5100

## 2017-06-14 NOTE — Evaluation (Signed)
Occupational Therapy Evaluation Patient Details Name: [name] MRN: [id number] DOB: Mar 31, 1945 Today's Date: 06/14/2017    History of Present Illness Pt admitted from SNF where she was receiving therapy following back sx at [facility] with chest pain. PMH: extensive cardiac history, DM, HTN, AAA, back sx Jan 2019.   Clinical Impression   Pt was ambulating with a RW with assistance and assisted with ADL in the SNF prior to this admission. Pt presents with back pain, generalized weakness and impaired standing balance. She requires min assist to stand and min guard to max assist for ADL. Pt will need further rehab upon discharge and is agreeable to this recommendation.     Follow Up Recommendations  SNF;Supervision/Assistance - 24 hour    Equipment Recommendations       Recommendations for Other Services       Precautions / Restrictions Precautions Precautions: Back;Fall Precaution Booklet Issued: No Precaution Comments: Pt able to state and adhere to back precautions Restrictions Weight Bearing Restrictions: No Other Position/Activity Restrictions: pt reports she has a back brace, but it is not here in the hospital      Mobility Bed Mobility Overal bed mobility: Needs Assistance Bed Mobility: Rolling;Sidelying to Sit Rolling: Supervision Sidelying to sit: Supervision       General bed mobility comments: pt using log roll technique  Transfers Overall transfer level: Needs assistance Equipment used: Rolling walker (2 wheeled) Transfers: Sit to/from Stand Sit to Stand: Min assist         General transfer comment: assist to rise from bed, increased time    Balance Overall balance assessment: Needs assistance   Sitting balance-Leahy Scale: Good       Standing balance-Leahy Scale: Poor                             ADL either performed or assessed with clinical judgement   ADL Overall ADL's : Needs assistance/impaired Eating/Feeding:  Independent;Sitting   Grooming: Min guard;Standing   Upper Body Bathing: Minimal assistance;Sitting   Lower Body Bathing: Maximal assistance;Sit to/from stand   Upper Body Dressing : Set up;Sitting   Lower Body Dressing: Maximal assistance;Sit to/from stand   Toilet Transfer: Min guard;Ambulation;RW   Toileting- Clothing Manipulation and Hygiene: Minimal assistance;Sit to/from stand       Functional mobility during ADLs: Min guard;Rolling walker       Vision Patient Visual Report: No change from baseline       Perception     Praxis      Pertinent Vitals/Pain Pain Assessment: Faces Faces Pain Scale: Hurts even more Pain Location: back Pain Descriptors / Indicators: Sore Pain Intervention(s): Monitored during session     Hand Dominance Right   Extremity/Trunk Assessment Upper Extremity Assessment Upper Extremity Assessment: Overall WFL for tasks assessed   Lower Extremity Assessment Lower Extremity Assessment: Defer to PT evaluation   Cervical / Trunk Assessment Cervical / Trunk Assessment: Other exceptions Cervical / Trunk Exceptions: recent back surgery   Communication Communication Communication: No difficulties   Cognition Arousal/Alertness: Awake/alert Behavior During Therapy: Flat affect Overall Cognitive Status: Within Functional Limits for tasks assessed                                 General Comments: pt grumpy   General Comments       Exercises     Shoulder Instructions  Home Living Family/patient expects to be discharged to:: Skilled nursing facility Living Arrangements: Children(sister) Available Help at Discharge: Family;Available PRN/intermittently Type of Home: House Home Access: Stairs to enter [employer] of Steps: 4 Entrance Stairs-Rails: Right;Left Home Layout: One level     Bathroom Shower/Tub: Teacher, early years/pre: Standard     Home Equipment: Environmental consultant - 2 wheels;Cane -  single point;Adaptive equipment;Tub bench;Bedside commode;Toilet riser Adaptive Equipment: Reacher        Prior Functioning/Environment Level of Independence: Needs assistance  Gait / Transfers Assistance Needed: using a walker at the facility ADL's / Homemaking Assistance Needed: assisted with ADL at facility            OT Problem List: Decreased activity tolerance;Impaired balance (sitting and/or standing);Decreased knowledge of use of DME or AE;Obesity;Pain      OT Treatment/Interventions:      OT Goals(Current goals can be found in the care plan section) Acute Rehab OT Goals Patient Stated Goal: to go back to rehab and then return home  OT Frequency:     Barriers to D/C:            Co-evaluation              AM-PAC PT "6 Clicks" Daily Activity     Outcome Measure Help from another person eating meals?: None Help from another person taking care of personal grooming?: A Little Help from another person toileting, which includes using toliet, bedpan, or urinal?: A Little Help from another person bathing (including washing, rinsing, drying)?: A Lot Help from another person to put on and taking off regular upper body clothing?: None Help from another person to put on and taking off regular lower body clothing?: A Lot 6 Click Score: 18   End of Session Equipment Utilized During Treatment: Gait belt;Rolling walker  Activity Tolerance: Patient tolerated treatment well Patient left: in bed;with call bell/phone within reach  OT Visit Diagnosis: Other abnormalities of gait and mobility (R26.89);Pain                Time: 6389-3734 OT Time Calculation (min): 24 min Charges:  OT General Charges $OT Visit: 1 Visit OT Evaluation $OT Eval Moderate Complexity: 1 Mod G-Codes:     Jun 22, 2017 Nestor Lewandowsky, OTR/L Pager: [phone]  Werner Lean, Haze Boyden Jun 22, 2017, 10:57 AM

## 2017-06-14 NOTE — Progress Notes (Addendum)
3:48 pm Provided SNF bed offers and reviewed with patient. Patient wants to discuss with family. CSW to follow up for choice.  1:01 pm CSW awaiting bed offers and will provide to patient when available. Patient will require [facility] authorization once facility identified; will need auth before admitting to a facility. CSW to follow.  Estanislado Emms, [place]

## 2017-06-14 NOTE — Progress Notes (Signed)
PROGRESS NOTE  [name]  KGU:[id number] DOB: 1945/12/04 DOA: 06/11/2017 PCP: Biagio Borg, MD   Brief Narrative: [name] is a 72 y.o. female with a history of CAD with multiple stents over the years, AAA s/p stent graft repair 2018, HTN, HLD, T2DM, hypothyroidism, history of tobacco use, obesity, anxiety, depression and kyphosis, spinal instability and lumbar myelopathy s/p thoracic laminectomy 5/20. She presented to the ED from SNF for chest pain. This was felt to be atypical (tender to palpation of chest wall), and ACS ruled out with negative troponins, no ischemia on ECG, and no wall motion abnormalities on echocardiogram. D-dimer elevated but no PE on CTA chest. Has had no clinical bleeding though had 1u PRBCs given for hgb 7.7g/dl, found to be deficient in iron, vitamin B12 and folate all have been supplemented.  Assessment & Plan: Active Problems:   Hypothyroidism   Diabetes ([place])   Hyperlipidemia   Essential hypertension   Coronary artery disease involving native heart without angina pectoris   Low back pain   Gout   Chest pain   History of endovascular stent graft for abdominal aortic aneurysm (AAA)   Iron deficiency anemia  Atypical chest pain: High suspicion for costochondritis with an element of anxiety worsened by the recent death of her last living relative due to heart disease (Brother died after DC from this hospital 2 weeks ago). Negative ischemic work up.  - Reassurance provided - Continue medications for CAD including ASA, plavix, beta blocker, statin - Cardiology consulted, no further interventions planned.  Microcytic anemia: With multiple vitamin/mineral deficiencies (S28, folic acid, iron all low) - Started, and will continue iron, B12, and folate supplements - Monitor CBC and consider transfusion with threshold of 8g/dl w/CAD.  - FOBT pending, though has no gross bleeding.  - Follow up with [facility] (had endoscopies by Dr. Olevia Perches and Dr. Deatra Ina in  the past showing no upper bleeding sources and non-bleeding diverticulosis).  - Had h/o GERD, continuing PPI  Thrombocytosis: Suspect reactive to iron deficiency anemia  Back pain: Chronic, stable, improved with back surgery - Continue home medications  HTN: BP soft - Continue atenolol, would hold if MAP < 75.   Hyperlipidemia:  - Continue statin  Hypothyroidism: TSH 0.51 in Feb 2019.  - Continue synthroid  AAA: s/p repair - Follow up with [facility]   T2DM: Last HbA1c was 5.9%  Gout: Chronic, no flare/joint involvement - Continue allopurinol  DVT prophylaxis: Lovenox Code Status: Full Family Communication: None at bedside Disposition Plan: SNF. Medically stable, awaiting pt choice and [employer] authorization  Consultants:   Cardiology  Procedures:  TTE: 06/13/17  Study Conclusions  - Left ventricle: The cavity size was normal. Systolic function was normal. The estimated ejection fraction was in the range of 55% to 60%. Wall motion was normal; there were no regional wall motion abnormalities. Doppler parameters are consistent with abnormal left ventricular relaxation (grade 1 diastolic dysfunction). Doppler parameters are consistent with indeterminate ventricular filling pressure. - Aortic valve: Transvalvular velocity was within the normal range. There was no stenosis. There was no regurgitation. Valve area (VTI): 3.25 cm^2. Valve area (Vmax): 3.08 cm^2. Valve area (Vmean): 3.16 cm^2. - Mitral valve: Transvalvular velocity was within the normal range. There was no evidence for stenosis. There was no regurgitation. Valve area by pressure half-time: 1.06 cm^2. - Left atrium: The atrium was moderately dilated. - Right ventricle: The cavity size was normal. Wall thickness was normal. Systolic function was normal. - Tricuspid  valve: There was trivial regurgitation. - Pulmonary arteries: Systolic pressure was within the normal range. PA peak  pressure: 20 mm Hg (S). - Pericardium, extracardiac: A trivial pericardial effusion was identified.  Antimicrobials: None   Subjective: Chest pain is subsided, intermittent but generally getting better. Anxious about discharging to another facility. Has no shortness of breath, leg swelling, orthopnea. No fever.   Objective: Vitals:   06/14/17 0903 06/14/17 1322 06/14/17 1331 06/14/17 1428  BP: 106/64 (!) 100/58  (!) 105/58  Pulse:   71 61  Resp: 16   15  Temp:    98.1 F (36.7 C)  TempSrc:    Oral  SpO2:    100%  Weight:      Height:        Intake/Output Summary (Last 24 hours) at 06/14/2017 1614 Last data filed at 06/14/2017 1300 Gross per 24 hour  Intake 1760 ml  Output 1700 ml  Net 60 ml   Filed Weights   06/12/17 1612 06/13/17 0417 06/14/17 0400  Weight: 102.3 kg (225 lb 8.5 oz) 104.4 kg (230 lb 2.6 oz) 104.9 kg (231 lb 3.2 oz)    Gen: Obese, pleasant female in no distress Pulm: Non-labored breathing room air. Clear to auscultation bilaterally.  CV: Regular rate and rhythm. No murmur, rub, or gallop. Equivocal JVP, No significant pedal edema. GI: Abdomen soft, non-tender, non-distended, with normoactive bowel sounds. No organomegaly or masses felt. Ext: Warm, no deformities Skin: No rashes, lesions or ulcers Neuro: Alert and oriented. No focal neurological deficits. Psych: Judgement and insight appear normal. Mood & affect appropriate.   Data Reviewed: I have personally reviewed following labs and imaging studies  CBC: Recent Labs  Lab 06/11/17 1658 06/12/17 0446 06/13/17 0714 06/13/17 2226  WBC 7.9 8.2 7.9 9.5  HGB 7.7* 7.3* 7.7* 9.3*  HCT 25.6* 24.9* 26.3* 31.2*  MCV 73.4* 75.0* 74.9* 76.3*  PLT 408* 419* 466* 308*   Basic Metabolic Panel: Recent Labs  Lab 06/11/17 1658 06/12/17 0446 06/13/17 0714  NA 141 144 143  K 3.6 3.4* 4.2  CL 105 108 109  CO2 27 25 26   GLUCOSE 105* 121* 99  BUN 5* 7 8  CREATININE 0.88 0.92 0.93  CALCIUM 7.7* 7.6*  7.5*   GFR: Estimated Creatinine Clearance: 66.7 mL/min (by C-G formula based on SCr of 0.93 mg/dL). Liver Function Tests: Recent Labs  Lab 06/12/17 0446  AST 23  ALT 20  ALKPHOS 82  BILITOT 0.3  PROT 6.0*  ALBUMIN 2.3*   No results for input(s): LIPASE, AMYLASE in the last 168 hours. No results for input(s): AMMONIA in the last 168 hours. Coagulation Profile: No results for input(s): INR, PROTIME in the last 168 hours. Cardiac Enzymes: Recent Labs  Lab 06/11/17 2255 06/12/17 0446 06/12/17 1040  TROPONINI <0.03 <0.03 <0.03   BNP (last 3 results) Recent Labs    12/06/16 1137  PROBNP 47   HbA1C: No results for input(s): HGBA1C in the last 72 hours. CBG: Recent Labs  Lab 06/14/17 0034 06/14/17 0400 06/14/17 0751 06/14/17 1224 06/14/17 1556  GLUCAP 131* 136* 89 112* 137*   Lipid Profile: No results for input(s): CHOL, HDL, LDLCALC, TRIG, CHOLHDL, LDLDIRECT in the last 72 hours. Thyroid Function Tests: No results for input(s): TSH, T4TOTAL, FREET4, T3FREE, THYROIDAB in the last 72 hours. Anemia Panel: Recent Labs    06/12/17 1629  VITAMINB12 100*  FOLATE 5.6*  FERRITIN 154  TIBC 266  IRON 26*  RETICCTPCT 2.7   Urine  analysis:    Component Value Date/Time   COLORURINE YELLOW 06/11/2017 0345   APPEARANCEUR CLEAR 06/11/2017 0345   LABSPEC 1.038 (H) 06/11/2017 0345   PHURINE 5.0 06/11/2017 0345   GLUCOSEU NEGATIVE 06/11/2017 0345   GLUCOSEU NEGATIVE 05/29/2017 1503   HGBUR NEGATIVE 06/11/2017 0345   HGBUR negative 06/22/2007 1400   BILIRUBINUR NEGATIVE 06/11/2017 0345   BILIRUBINUR neg 11/12/2015 1813   KETONESUR NEGATIVE 06/11/2017 0345   PROTEINUR NEGATIVE 06/11/2017 0345   UROBILINOGEN 0.2 05/29/2017 1503   NITRITE NEGATIVE 06/11/2017 0345   LEUKOCYTESUR NEGATIVE 06/11/2017 0345   Recent Results (from the past 240 hour(s))  Urine culture     Status: None   Collection Time: 06/11/17  3:45 AM  Result Value Ref Range Status   Specimen  Description URINE, RANDOM  Final   Special Requests NONE  Final   Culture   Final    NO GROWTH Performed at [facility], [place] [address] [place], [place] [address]    Report Status 06/13/2017 FINAL  Final  MRSA PCR Screening     Status: None   Collection Time: 06/12/17  5:18 PM  Result Value Ref Range Status   MRSA by PCR NEGATIVE NEGATIVE Final    Comment:        The GeneXpert MRSA Assay (FDA approved for NASAL specimens only), is one component of a comprehensive MRSA colonization surveillance program. It is not intended to diagnose MRSA infection nor to guide or monitor treatment for MRSA infections. Performed at [facility], [place] [address], [place], [place] [address]       Radiology Studies: No results found.  Scheduled Meds: . aspirin EC  81 mg Oral Daily  . atenolol  25 mg Oral Daily  . clopidogrel  75 mg Oral Daily  . enoxaparin (LOVENOX) injection  40 mg Subcutaneous Daily  . folic acid  1 mg Oral Daily  . gabapentin  600 mg Oral TID  . insulin aspart  0-9 Units Subcutaneous Q4H  . iron polysaccharides  150 mg Oral Q breakfast  . levothyroxine  150 mcg Oral QAC breakfast  . loratadine  10 mg Oral Daily  . methocarbamol  500 mg Oral TID  . morphine  15 mg Oral Q12H   Followed by  . [START ON 06/17/2017] morphine  15 mg Oral QHS  . pantoprazole  40 mg Oral Daily  . polyethylene glycol  17 g Oral Daily  . senna-docusate  1 tablet Oral BID  . sodium chloride flush  3 mL Intravenous Q12H  . vitamin B-12  1,000 mcg Oral Daily   Continuous Infusions: . sodium chloride       LOS: 3 days   Time spent: 25 minutes.  Patrecia Pour, MD [facility] [email or web address] Password [facility] 06/14/2017, 4:14 PM

## 2017-06-14 NOTE — Evaluation (Signed)
Physical Therapy Evaluation Patient Details Name: [name] MRN: [id number] DOB: 1945-12-14 Today's Date: 06/14/2017   History of Present Illness  Pt admitted from SNF where she was receiving therapy following back sx at [facility] with chest pain. PMH: extensive cardiac history, DM, HTN, AAA, back sx Jan 2019.  Clinical Impression  Pt was assessed for mobiltiy and noted her ability to move is improving but restricted to return home essentially with time alone.  Her plan is to return for a short time to SNF and then make progression home.  Follow acutely for strengthening and fall reduction working on balance and quality of gait.    Follow Up Recommendations SNF    Equipment Recommendations  None recommended by PT(choose in next venue of care)    Recommendations for Other Services       Precautions / Restrictions Precautions Precautions: Back;Fall Precaution Booklet Issued: No Precaution Comments: Pt able to state and adhere to back precautions Restrictions Weight Bearing Restrictions: No Other Position/Activity Restrictions: has back brace but not wearing here      Mobility  Bed Mobility Overal bed mobility: Needs Assistance Bed Mobility: Rolling;Sidelying to Sit;Sit to Sidelying Rolling: Supervision Sidelying to sit: Min guard     Sit to sidelying: Min assist(to elevate legs) General bed mobility comments: rolled in correct posture and using bed rail to assist her lift  Transfers Overall transfer level: Needs assistance Equipment used: Rolling walker (2 wheeled) Transfers: Sit to/from Stand Sit to Stand: Min assist         General transfer comment: used gait belt under arms to avoid surgery area  Ambulation/Gait Ambulation/Gait assistance: Min guard Ambulation Distance (Feet): 150 Feet Assistive device: Rolling walker (2 wheeled);1 person hand held assist Gait Pattern/deviations: Step-through pattern;Decreased stride length;Wide base of  support;Drifts right/left(wide turns with difficulty controlling walker there) Gait velocity: reduced Gait velocity interpretation: <1.8 ft/sec, indicate of risk for recurrent falls    Stairs Stairs: (deferred)          Wheelchair Mobility    Modified Rankin (Stroke Patients Only)       Balance Overall balance assessment: Needs assistance Sitting-balance support: Feet supported;Bilateral upper extremity supported Sitting balance-Leahy Scale: Good     Standing balance support: Bilateral upper extremity supported;During functional activity Standing balance-Leahy Scale: Fair Standing balance comment: less than fair dynamic standing                             Pertinent Vitals/Pain Pain Assessment: 0-10 Pain Score: 6  Faces Pain Scale: Hurts even more Pain Location: back Pain Descriptors / Indicators: Sore Pain Intervention(s): Limited activity within patient's tolerance;Monitored during session;Premedicated before session;Repositioned;Patient requesting pain meds-RN notified;Other (comment)(used 4 pillows to fully support sidelying posture w/knees)    Home Living Family/patient expects to be discharged to:: [place]: Children(sister) Available Help at Discharge: Family;Available PRN/intermittently Type of Home: House Home Access: Stairs to enter Entrance Stairs-Rails: Psychiatric nurse of Steps: 4 Home Layout: One level Home Equipment: Walker - 2 wheels;Cane - single point;Adaptive equipment;Tub bench;Bedside commode;Toilet riser      Prior Function Level of Independence: Needs assistance   Gait / Transfers Assistance Needed: using a walker at the facility  ADL's / Homemaking Assistance Needed: assisted with ADL at facility        Hand Dominance   Dominant Hand: Right    Extremity/Trunk Assessment   Upper Extremity Assessment Upper Extremity Assessment: Overall  WFL for tasks assessed     Lower Extremity Assessment Lower Extremity Assessment: Generalized weakness    Cervical / Trunk Assessment Cervical / Trunk Assessment: Other exceptions(recent thoracic spine surgery) Cervical / Trunk Exceptions: recent back surgery  Communication   Communication: No difficulties  Cognition Arousal/Alertness: Awake/alert Behavior During Therapy: Flat affect Overall Cognitive Status: Within Functional Limits for tasks assessed                                 General Comments: pt is in more pain after PT and was displeased      General Comments General comments (skin integrity, edema, etc.): clean bandage on thoracic spine    Exercises     Assessment/Plan    PT Assessment Patient needs continued PT services  PT Problem List Decreased strength;Decreased range of motion;Decreased activity tolerance;Decreased balance;Decreased mobility;Decreased coordination;Decreased knowledge of use of DME;Decreased safety awareness;Decreased skin integrity;Pain       PT Treatment Interventions DME instruction;Gait training;Stair training;Functional mobility training;Therapeutic activities;Therapeutic exercise;Balance training;Neuromuscular re-education;Patient/family education    PT Goals (Current goals can be found in the Care Plan section)  Acute Rehab PT Goals Patient Stated Goal: to go back to rehab and then return home PT Goal Formulation: With patient Time For Goal Achievement: 06/28/17 Potential to Achieve Goals: Good    Frequency Min 2X/week   Barriers to discharge Inaccessible home environment;Decreased caregiver support home with intermittent help, stairs to enter house    Co-evaluation               AM-PAC PT "6 Clicks" Daily Activity  Outcome Measure Difficulty turning over in bed (including adjusting bedclothes, sheets and blankets)?: A Little Difficulty moving from lying on back to sitting on the side of the bed? : Unable Difficulty sitting down  on and standing up from a chair with arms (e.g., wheelchair, bedside commode, etc,.)?: Unable Help needed moving to and from a bed to chair (including a wheelchair)?: A Little Help needed walking in hospital room?: A Little Help needed climbing 3-5 steps with a railing? : A Lot 6 Click Score: 13    End of Session Equipment Utilized During Treatment: Gait belt Activity Tolerance: Patient limited by fatigue Patient left: in bed;with call bell/phone within reach;with bed alarm set Nurse Communication: Mobility status;Patient requests pain meds PT Visit Diagnosis: Unsteadiness on feet (R26.81);Muscle weakness (generalized) (M62.81);Difficulty in walking, not elsewhere classified (R26.2);Pain Pain - Right/Left: (B) Pain - part of body: (thoracic spine)    Time: 7510-2585 PT Time Calculation (min) (ACUTE ONLY): 18 min   Charges:   PT Evaluation $PT Eval Moderate Complexity: 1 Mod     PT G Codes:   PT G-Codes **NOT FOR INPATIENT CLASS** Functional Assessment Tool Used: AM-PAC 6 Clicks Basic Mobility    Ramond Dial 06/14/2017, 11:55 AM   Mee Hives, PT MS Acute Rehab Dept. Number: [place] and [place]

## 2017-06-14 NOTE — NC FL2 (Signed)
[place] LEVEL OF CARE SCREENING TOOL     IDENTIFICATION  Patient Name: [name] Birthdate: Nov 29, 1945 Sex: female Admission Date (Current Location): 06/11/2017  [facility] and [place] Number:  Herbalist and Address:  [facility] [place], [place] [address], [place], [place] [address]      Provider Number: [id number]  Attending Physician Name and Address:  Patrecia Pour, MD  Relative Name and Phone Number:  Aeliana Spates, daughter, [phone]    Current Level of Care: Hospital Recommended Level of Care: [place] Prior Approval Number:    Date Approved/Denied:   PASRR Number: [id number]  Discharge Plan: SNF    Current Diagnoses: Patient Active Problem List   Diagnosis Date Noted  . Iron deficiency anemia 06/13/2017  . Coronary artery disease   . Bilateral swelling of feet 05/29/2017  . Pharyngitis 01/11/2017  . Vaginal yeast infection 07/05/2016  . Lower abdominal pain 07/05/2016  . Chronic low back pain 06/28/2016  . Insomnia 06/28/2016  . History of endovascular stent graft for abdominal aortic aneurysm (AAA) 01/22/2016  . Lumbar radiculopathy 05/15/2015  . Acute sinus infection 05/14/2015  . Arthritis of left hip 04/16/2015  . Acute gout 01/16/2015  . Acute upper respiratory infection 06/09/2014  . Angioedema of lips 06/03/2014  . Frequent urination 05/08/2014  . Acute epiglottitis without obstruction 05/05/2014  . Angioedema   . Neck swelling   . Abdominal tenderness 03/26/2014  . Noncompliance with medications 02/26/2014  . AAA (abdominal aortic aneurysm) ([place])   . Chest pain 02/24/2014  . Jaw pain 09/25/2013  . Myalgia 09/25/2013  . Gout 08/22/2013  . Abdominal pain, unspecified site 08/13/2013  . Musculoskeletal pain 08/04/2013  . Aftercare following surgery of the circulatory system, [place] 06/25/2013  . Peripheral vascular disease, unspecified ([place]) 06/19/2012  . Dry cough 03/05/2012  .  Dysuria 04/21/2011  . Low back pain 10/04/2010  . Acute ischemic colitis ([place]) 07/29/2010  . Lower GI bleed 07/23/2010  . Liver lesion 07/23/2010  . Encounter for well adult exam with abnormal findings 07/22/2010  . URTICARIA 09/23/2009  . TOBACCO ABUSE 11/18/2008  . Obesity 11/17/2008  . MENOPAUSAL DISORDER 10/29/2007  . ANXIETY 06/22/2007  . DEPRESSION 06/22/2007  . OTITIS MEDIA, SEROUS, CHRONIC 04/23/2007  . Constipation 02/07/2007  . [place] DISEASE, LUMBOSACRAL SPINE 02/07/2007  . Diabetes ([place]) 11/03/2006  . Allergic rhinitis 11/03/2006  . Hypothyroidism 07/31/2006  . Hyperlipidemia 07/31/2006  . Essential hypertension 07/31/2006  . Coronary artery disease involving native heart without angina pectoris 07/31/2006    Orientation RESPIRATION BLADDER Height & Weight     Self, Time, Situation, Place  Normal Continent Weight: 231 lb 3.2 oz (104.9 kg) Height:  5\' 5"  (165.1 cm)  BEHAVIORAL SYMPTOMS/MOOD NEUROLOGICAL BOWEL NUTRITION STATUS      Continent Diet(please see DC summary)  AMBULATORY STATUS COMMUNICATION OF NEEDS Skin   Limited Assist Verbally Surgical wounds(closed incision on back)                       Personal Care Assistance Level of Assistance  Bathing, Feeding, Dressing Bathing Assistance: Limited assistance Feeding assistance: Independent Dressing Assistance: Limited assistance     Functional Limitations Info  Sight, Hearing, Speech Sight Info: Adequate Hearing Info: Adequate Speech Info: Adequate    SPECIAL CARE FACTORS FREQUENCY  PT (By licensed PT), OT (By licensed OT)     PT Frequency: 5x/week OT Frequency: 5x/week  Contractures Contractures Info: Not present    Additional Factors Info  Code Status, Allergies, Insulin Sliding Scale Code Status Info: Full Allergies Info: Prilosec Omeprazole, Miconazole Nitrate, Crestor Rosuvastatin Calcium, Augmentin Amoxicillin-pot Clavulanate, Doxycycline   Insulin Sliding Scale Info:  novolog Q4 hours       Current Medications (06/14/2017):  This is the current hospital active medication list Current Facility-Administered Medications  Medication Dose Route Frequency Provider Last Rate Last Dose  . 0.9 %  sodium chloride infusion  250 mL Intravenous PRN Jani Gravel, MD      . acetaminophen (TYLENOL) tablet 650 mg  650 mg Oral Q6H PRN Jani Gravel, MD   650 mg at 06/12/17 0344   Or  . acetaminophen (TYLENOL) suppository 650 mg  650 mg Rectal Q6H PRN Jani Gravel, MD      . aspirin EC tablet 81 mg  81 mg Oral Daily Jani Gravel, MD   81 mg at 06/14/17 2671  . atenolol (TENORMIN) tablet 25 mg  25 mg Oral Daily Jani Gravel, MD   25 mg at 06/13/17 2458  . clopidogrel (PLAVIX) tablet 75 mg  75 mg Oral Daily Jani Gravel, MD   75 mg at 06/14/17 0904  . enoxaparin (LOVENOX) injection 40 mg  40 mg Subcutaneous Daily Jani Gravel, MD   40 mg at 06/13/17 1350  . folic acid (FOLVITE) tablet 1 mg  1 mg Oral Daily Hosie Poisson, MD   1 mg at 06/14/17 0903  . gabapentin (NEURONTIN) capsule 600 mg  600 mg Oral TID Jani Gravel, MD   600 mg at 06/14/17 0902  . insulin aspart (novoLOG) injection 0-9 Units  0-9 Units Subcutaneous Q4H Jani Gravel, MD   2 Units at 06/13/17 1349  . iron polysaccharides (NIFEREX) capsule 150 mg  150 mg Oral Q breakfast Jani Gravel, MD   150 mg at 06/14/17 0903  . levothyroxine (SYNTHROID, LEVOTHROID) tablet 150 mcg  150 mcg Oral QAC breakfast Jani Gravel, MD   150 mcg at 06/14/17 0998  . loratadine (CLARITIN) tablet 10 mg  10 mg Oral Daily Jani Gravel, MD   10 mg at 06/14/17 0903  . methocarbamol (ROBAXIN) tablet 500 mg  500 mg Oral TID Jani Gravel, MD   500 mg at 06/14/17 3382  . morphine (MS CONTIN) 12 hr tablet 15 mg  15 mg Oral Q12H Jani Gravel, MD   15 mg at 06/14/17 5053   Followed by  . [START ON 06/17/2017] morphine (MS CONTIN) 12 hr tablet 15 mg  15 mg Oral QHS Jani Gravel, MD      . oxyCODONE (Oxy IR/ROXICODONE) immediate release tablet 5 mg  5 mg Oral Q8H PRN Hosie Poisson,  MD      . pantoprazole (PROTONIX) EC tablet 40 mg  40 mg Oral Daily Jani Gravel, MD   40 mg at 06/14/17 9767  . polyethylene glycol (MIRALAX / GLYCOLAX) packet 17 g  17 g Oral Daily Hosie Poisson, MD   17 g at 06/14/17 0902  . senna-docusate (Senokot-S) tablet 1 tablet  1 tablet Oral BID Hosie Poisson, MD   1 tablet at 06/14/17 0903  . sodium chloride flush (NS) 0.9 % injection 3 mL  3 mL Intravenous Q12H Jani Gravel, MD   3 mL at 06/14/17 0904  . sodium chloride flush (NS) 0.9 % injection 3 mL  3 mL Intravenous PRN Jani Gravel, MD   3 mL at 06/12/17 1020  . vitamin B-12 (CYANOCOBALAMIN) tablet 1,000 mcg  1,000  mcg Oral Daily Hosie Poisson, MD   1,000 mcg at 06/14/17 2694     Discharge Medications: Please see discharge summary for a list of discharge medications.  Relevant Imaging Results:  Relevant Lab Results:   Additional Information SSN: [id number]  Estanislado Emms, LCSW

## 2017-06-14 NOTE — Progress Notes (Addendum)
Progress Note  Patient Name: [name] Date of Encounter: 06/14/2017  Primary Cardiologist: Sherren Mocha, MD  Subjective   Actually feeling better this morning, still with mild intermittent episodes of chest discomfort. Sleeping until awoken and reported chest discomfort.   Inpatient Medications    Scheduled Meds: . aspirin EC  81 mg Oral Daily  . atenolol  25 mg Oral Daily  . clopidogrel  75 mg Oral Daily  . enoxaparin (LOVENOX) injection  40 mg Subcutaneous Daily  . folic acid  1 mg Oral Daily  . gabapentin  600 mg Oral TID  . insulin aspart  0-9 Units Subcutaneous Q4H  . iron polysaccharides  150 mg Oral Q breakfast  . levothyroxine  150 mcg Oral QAC breakfast  . loratadine  10 mg Oral Daily  . methocarbamol  500 mg Oral TID  . morphine  15 mg Oral Q12H   Followed by  . [START ON 06/17/2017] morphine  15 mg Oral QHS  . pantoprazole  40 mg Oral Daily  . polyethylene glycol  17 g Oral Daily  . senna-docusate  1 tablet Oral BID  . sodium chloride flush  3 mL Intravenous Q12H  . vitamin B-12  1,000 mcg Oral Daily   Continuous Infusions: . sodium chloride     PRN Meds: sodium chloride, acetaminophen **OR** acetaminophen, oxyCODONE, sodium chloride flush   Vital Signs    Vitals:   06/13/17 1941 06/13/17 2300 06/14/17 0400 06/14/17 0903  BP: (!) 106/59 111/65 115/64 106/64  Pulse: 67 68 66   Resp: 16 17 13 16   Temp: 98.7 F (37.1 C)  98 F (36.7 C)   TempSrc: Oral  Oral   SpO2: 93%  100%   Weight:   231 lb 3.2 oz (104.9 kg)   Height:        Intake/Output Summary (Last 24 hours) at 06/14/2017 1025 Last data filed at 06/14/2017 0900 Gross per 24 hour  Intake 1760 ml  Output 1750 ml  Net 10 ml   Filed Weights   06/12/17 1612 06/13/17 0417 06/14/17 0400  Weight: 225 lb 8.5 oz (102.3 kg) 230 lb 2.6 oz (104.4 kg) 231 lb 3.2 oz (104.9 kg)    Telemetry    SR - Personally Reviewed  ECG    SR - Personally Reviewed  Physical Exam   General: Well  developed, well nourished, female appearing in no acute distress. Head: Normocephalic, atraumatic.  Neck: Supple without bruits, JVD. Lungs:  Resp regular and unlabored, CTA. Heart: RRR, S1, S2, no murmur; no rub. Abdomen: Soft, non-tender, non-distended with normoactive bowel sounds. Extremities: No clubbing, cyanosis, edema. Distal pedal pulses are 2+ bilaterally. Neuro: Alert and oriented X 3. Moves all extremities spontaneously. Psych: Normal affect.  Labs    Chemistry Recent Labs  Lab 06/11/17 1658 06/12/17 0446 06/13/17 0714  NA 141 144 143  K 3.6 3.4* 4.2  CL 105 108 109  CO2 27 25 26   GLUCOSE 105* 121* 99  BUN 5* 7 8  CREATININE 0.88 0.92 0.93  CALCIUM 7.7* 7.6* 7.5*  PROT  --  6.0*  --   ALBUMIN  --  2.3*  --   AST  --  23  --   ALT  --  20  --   ALKPHOS  --  82  --   BILITOT  --  0.3  --   GFRNONAA >60 >60 >60  GFRAA >60 >60 >60  ANIONGAP 9 11 8  Hematology Recent Labs  Lab 06/12/17 0446 06/12/17 1629 06/13/17 0714 06/13/17 2226  WBC 8.2  --  7.9 9.5  RBC 3.32* 3.51* 3.51* 4.09  HGB 7.3*  --  7.7* 9.3*  HCT 24.9*  --  26.3* 31.2*  MCV 75.0*  --  74.9* 76.3*  MCH 22.0*  --  21.9* 22.7*  MCHC 29.3*  --  29.3* 29.8*  RDW 21.6*  --  22.7* 23.5*  PLT 419*  --  466* 508*    Cardiac Enzymes Recent Labs  Lab 06/11/17 2255 06/12/17 0446 06/12/17 1040  TROPONINI <0.03 <0.03 <0.03    Recent Labs  Lab 06/11/17 1706 06/11/17 1958  TROPIPOC 0.00 0.03     BNP Recent Labs  Lab 06/11/17 1658  BNP 110.3*     DDimer  Recent Labs  Lab 06/11/17 1701  DDIMER 3.18*      Radiology    No results found.  Cardiac Studies   TTE: 06/13/17  Study Conclusions  - Left ventricle: The cavity size was normal. Systolic function was   normal. The estimated ejection fraction was in the range of 55%   to 60%. Wall motion was normal; there were no regional wall   motion abnormalities. Doppler parameters are consistent with   abnormal left  ventricular relaxation (grade 1 diastolic   dysfunction). Doppler parameters are consistent with   indeterminate ventricular filling pressure. - Aortic valve: Transvalvular velocity was within the normal range.   There was no stenosis. There was no regurgitation. Valve area   (VTI): 3.25 cm^2. Valve area (Vmax): 3.08 cm^2. Valve area   (Vmean): 3.16 cm^2. - Mitral valve: Transvalvular velocity was within the normal range.   There was no evidence for stenosis. There was no regurgitation.   Valve area by pressure half-time: 1.06 cm^2. - Left atrium: The atrium was moderately dilated. - Right ventricle: The cavity size was normal. Wall thickness was   normal. Systolic function was normal. - Tricuspid valve: There was trivial regurgitation. - Pulmonary arteries: Systolic pressure was within the normal   range. PA peak pressure: 20 mm Hg (S). - Pericardium, extracardiac: A trivial pericardial effusion was   identified.  Patient Profile     72 y.o. female with a hx of CAD (inferior MI 1998 s/p PCI of RCA, stenting of Cx 04/2010, DES to prox LAD 05/2013, DES x 3 in 10/2015), AAA s/p stent graft repair in 01/2016, anxiety, chronic LBP, DM, depression, HTN, HLD, hypothyroidism, prior lower GIB/diverticular bleed 2012, obesity, former tobacco (30-40 yrs)who presented for the evaluation of chest pain.   Assessment & Plan    1. Chest pain: Does have a prior hx of CAD with PCI. Symptoms seem atypical, intermittent not associated with rest or activity. She is tender with palpation. Trop neg x3. D-dimer was elevated but CTA neg for PE.   -- echo with normal EF and no WMA. Follow up EKG yesterday morning showed SR with no ischemia.   2. Anemia: denies any blood in her stools or urine prior to admission. Hgb 7.7>>7.3>>7.7. Received transfusion, Hgb up to 9.3 today. Stool cards pending. Further management per primary.  3. AAA s/p repair: followed by [facility]  4. HL: on Lipitor  5. HTN: controlled on  BB, actually soft at times.   Signed, Reino Bellis, NP  06/14/2017, 10:25 AM  Pager # [phone]   I have examined the patient and reviewed assessment and plan and discussed with patient.  Agree with above as stated.  Feels  better after transfusion.  No further cardiac tests planned at this time.  COntinue aggressive secondary prevention.  If bleeding source found, could stop aspirin and use clopidogrel monotherapy for CAD.  Larae Grooms   For questions or updates, please contact [facility] Please consult [email or web address] for contact info under Cardiology/STEMI.

## 2017-06-15 ENCOUNTER — Ambulatory Visit: Payer: Medicare Other | Admitting: Cardiovascular Disease

## 2017-06-15 DIAGNOSIS — I10 Essential (primary) hypertension: Secondary | ICD-10-CM | POA: Diagnosis not present

## 2017-06-15 DIAGNOSIS — R072 Precordial pain: Secondary | ICD-10-CM | POA: Diagnosis not present

## 2017-06-15 DIAGNOSIS — R0789 Other chest pain: Secondary | ICD-10-CM | POA: Diagnosis not present

## 2017-06-15 DIAGNOSIS — E11311 Type 2 diabetes mellitus with unspecified diabetic retinopathy with macular edema: Secondary | ICD-10-CM | POA: Diagnosis not present

## 2017-06-15 DIAGNOSIS — M940 Chondrocostal junction syndrome [Tietze]: Secondary | ICD-10-CM | POA: Diagnosis not present

## 2017-06-15 DIAGNOSIS — I2510 Atherosclerotic heart disease of native coronary artery without angina pectoris: Secondary | ICD-10-CM | POA: Diagnosis not present

## 2017-06-15 LAB — GLUCOSE, CAPILLARY
Glucose-Capillary: 103 mg/dL — ABNORMAL HIGH (ref 65–99)
Glucose-Capillary: 91 mg/dL (ref 65–99)
Glucose-Capillary: 120 mg/dL — ABNORMAL HIGH (ref 65–99)

## 2017-06-15 MED ORDER — FOLIC ACID 1 MG PO TABS: 1.0000 mg | ORAL_TABLET | Freq: Every day | ORAL | Status: DC

## 2017-06-15 MED ORDER — FERROUS FUM-IRON POLYSACCH 162-115.2 MG PO CAPS: 1.0000 | ORAL_CAPSULE | Freq: Every day | ORAL | Status: DC

## 2017-06-15 MED ORDER — MORPHINE SULFATE ER 15 MG PO TBCR: 15.0000 mg | EXTENDED_RELEASE_TABLET | Freq: Every day | ORAL | 0 refills | Status: DC

## 2017-06-15 MED ORDER — CYANOCOBALAMIN 1000 MCG PO TABS: 1000.0000 ug | ORAL_TABLET | Freq: Every day | ORAL | Status: DC

## 2017-06-15 MED ORDER — OXYCODONE HCL 5 MG PO TABS: 5.0000 mg | ORAL_TABLET | Freq: Four times a day (QID) | ORAL | 0 refills | Status: AC | PRN

## 2017-06-15 MED ORDER — METHOCARBAMOL 500 MG PO TABS: 500.0000 mg | ORAL_TABLET | Freq: Three times a day (TID) | ORAL | 0 refills | Status: DC

## 2017-06-15 NOTE — Progress Notes (Signed)
Patient will discharge to [facility] Anticipated discharge date: 06/15/17 Family notified: patient notified her daughter Transportation by: [facility]  Nurse to call report to [phone].   CSW signing off.  Estanislado Emms, [place]  Clinical Social Worker

## 2017-06-15 NOTE — Clinical Social Work Placement (Signed)
   CLINICAL SOCIAL WORK PLACEMENT  NOTE  Date:  06/15/2017  Patient Details  Name: [name] MRN: [id number] Date of Birth: 1945-12-18  Clinical Social Work is seeking post-discharge placement for this patient at the [place] level of care (*CSW will initial, date and re-position this form in  chart as items are completed):  Yes   Patient/family provided with [facility] Work Department's list of facilities offering this level of care within the geographic area requested by the patient (or if unable, by the patient's family).  Yes   Patient/family informed of their freedom to choose among providers that offer the needed level of care, that participate in Medicare, Medicaid or managed care program needed by the patient, have an available bed and are willing to accept the patient.  Yes   Patient/family informed of [facility]'s ownership interest in [facility] and [facility], as well as of the fact that they are under no obligation to receive care at these facilities.  PASRR submitted to EDS on       PASRR number received on       Existing PASRR number confirmed on 06/14/17     FL2 transmitted to all facilities in geographic area requested by pt/family on 06/13/17     FL2 transmitted to all facilities within larger geographic area on       Patient informed that his/her managed care company has contracts with or will negotiate with certain facilities, including the following:  [facility]     Yes   Patient/family informed of bed offers received.  Patient chooses bed at [facility]     Physician recommends and patient chooses bed at      Patient to be transferred to [facility] on 06/15/17.  Patient to be transferred to facility by [facility]     Patient family notified on 06/15/17 of transfer.  Name of family member notified:        PHYSICIAN Please prepare priority discharge summary, including medications, Please prepare  prescriptions     Additional Comment:    _______________________________________________ Estanislado Emms, LCSW 06/15/2017, 1:24 PM

## 2017-06-15 NOTE — Care Management Note (Signed)
Case Management Note  Patient Details  Name: [name] MRN: [id number] Date of Birth: 13-Jan-1946  Subjective/Objective: Pt presented for Chest Pain. CSW following for disposition needs. Plan at this time to transition to SNF ([facility]).                  Action/Plan: CM will continue to monitor for additional needs.   Expected Discharge Date:                  Expected Discharge Plan:  Skilled Nursing Facility  In-House Referral:  Clinical Social Work  Discharge planning Services  CM Consult  Post Acute Care Choice:  NA Choice offered to:  NA  DME Arranged:  N/A DME Agency:  NA  HH Arranged:  NA HH Agency:  NA  Status of Service:  Completed, signed off  If discussed at [place] of Stay Meetings, dates discussed:    Additional Comments:  Bethena Roys, RN 06/15/2017, 12:54 PM

## 2017-06-15 NOTE — Progress Notes (Signed)
Attempted report to [facility].

## 2017-06-15 NOTE — Care Management Important Message (Signed)
Important Message  Patient Details  Name: [name] MRN: [id number] Date of Birth: 23-Apr-1945   Medicare Important Message Given:  Yes    Orbie Pyo 06/15/2017, 3:22 PM

## 2017-06-15 NOTE — Discharge Summary (Signed)
Physician Discharge Summary  [name] DOB: 1945-04-07 DOA: 06/11/2017  PCP: Biagio Borg, MD  Admit date: 06/11/2017 Discharge date: 06/15/2017  Admitted From: SNF Disposition: SNF   Recommendations for Outpatient Follow-up:  1. Follow up with PCP in 1-2 weeks.  2. Follow up with cardiology prn 3. Follow up with back surgeon as scheduled 4. Please obtain BMP/CBC in one week. Continue supplements for folic acid, vitamin A35, and iron deficiency.  Home Health: N/A Equipment/Devices: Per SNF Discharge Condition: Stable CODE STATUS: Full Diet recommendation: Heart healthy, carb-modified  Brief/Interim Summary: [name] is a 72 y.o. female with a history of CAD with multiple stents over the years, AAA s/p stent graft repair 2018, HTN, HLD, T2DM, hypothyroidism, history of tobacco use, obesity, anxiety, depression and kyphosis, spinal instability and lumbar myelopathy s/p thoracic laminectomy 5/20. She presented to the ED from SNF for chest pain. This was felt to be atypical (tender to palpation of chest wall), and ACS ruled out with negative troponins, no ischemia on ECG, and no wall motion abnormalities on echocardiogram. D-dimer elevated but no PE on CTA chest. Has had no clinical bleeding though had 1u PRBCs given for hgb 7.7g/dl, found to be deficient in iron, vitamin B12 and folate all have been supplemented.  Discharge Diagnoses:  Active Problems:   Hypothyroidism   Diabetes ([place])   Hyperlipidemia   Essential hypertension   Coronary artery disease involving native heart without angina pectoris   Low back pain   Gout   Chest pain   History of endovascular stent graft for abdominal aortic aneurysm (AAA)   Iron deficiency anemia  Atypical chest pain: High suspicion for costochondritis with an element of anxiety worsened by the recent death of her last living relative due to heart disease (Brother died after DC from this hospital 2 weeks ago). Negative  ischemic work up.  - Reassurance provided - Continue medications for CAD including ASA, plavix, beta blocker, statin - Cardiology was consulted, no further interventions planned.  Microcytic anemia: With multiple vitamin/mineral deficiencies (T73, folic acid, iron all low) - Started, and will continue iron, B12, and folate supplements - Monitor CBC and consider transfusion with threshold of 8g/dl w/CAD.  - Recommend monitoring FOBT, though has no gross bleeding.  - Follow up with [facility] (had endoscopies by Dr. Olevia Perches and Dr. Deatra Ina in the past showing no upper bleeding sources and non-bleeding diverticulosis).  - Had h/o GERD, continuing PPI  Thrombocytosis: Suspect reactive to iron deficiency anemia  Back pain: Chronic, stable, improved with back surgery.  - Continue restrictions including no twisting, bending or lifting. - Continue home medications including taper of MS contin: going from 15mg  BID x7 days to 15mg  qHS x7 days (starting 6/1, stopping 6/7). OxyIR prn also ordered.  HTN: BP soft - Continue atenolol  Hyperlipidemia:  - Continue statin  Hypothyroidism: TSH 0.51 in Feb 2019.  - Continue synthroid  AAA: s/p repair - Follow up with [facility]   T2DM: Last HbA1c was 5.9% - Continue metformin  Gout: Chronic, no flare/joint involvement - Continue allopurinol  Discharge Instructions Discharge Instructions    Diet - low sodium heart healthy   Complete by:  As directed    Increase activity slowly   Complete by:  As directed      Allergies as of 06/15/2017      Reactions   Prilosec [omeprazole] Other (See Comments)   Chest pain   Miconazole Nitrate Hives   REACTION: hives  Crestor [rosuvastatin Calcium] Other (See Comments)   Feeling poor   Augmentin [amoxicillin-pot Clavulanate] Hives, Itching, Rash   Has patient had a PCN reaction causing immediate rash, facial/tongue/throat swelling, SOB or lightheadedness with hypotension:unsure Has patient had a PCN  reaction causing severe rash involving mucus membranes or skin necrosis:unsure Has patient had a PCN reaction that required hospitalization:No Has patient had a PCN reaction occurring within the last 10 years:NO If all of the above answers are "NO", then may proceed with Cephalosporin use. Has patient had a PCN reaction causing immediate rash, facial/tongue/throat swelling, SOB or lightheadedness with hypotension:unsure Has patient had a PCN reaction causing severe rash involving mucus membranes or skin necrosis:unsure Has patient had a PCN reaction that required hospitalization:No Has patient had a PCN reaction occurring within the last 10 years:NO If all of the above answers are "NO", then may proceed with Cephalosporin use.   Doxycycline Other (See Comments)   REACTION: gi upset      Medication List    STOP taking these medications   baclofen 10 MG tablet Commonly known as:  LIORESAL   nitrofurantoin 50 MG capsule Commonly known as:  MACRODANTIN   tiZANidine 4 MG tablet Commonly known as:  ZANAFLEX     TAKE these medications   allopurinol 300 MG tablet Commonly known as:  ZYLOPRIM Take 1 tablet (300 mg total) by mouth daily.   aspirin 81 MG EC tablet Take 81 mg by mouth daily.   atenolol 25 MG tablet Commonly known as:  TENORMIN TAKE 1 TABLET(25 MG) BY MOUTH TWICE DAILY What changed:  See the new instructions.   atorvastatin 20 MG tablet Commonly known as:  LIPITOR Take 1 tablet (20 mg total) by mouth daily.   clopidogrel 75 MG tablet Commonly known as:  PLAVIX Take 1 tablet (75 mg total) by mouth every evening. What changed:  when to take this   cyanocobalamin 1000 MCG tablet Take 1 tablet (1,000 mcg total) by mouth daily. Start taking on:  06/16/2017   famotidine 20 MG tablet Commonly known as:  PEPCID Take 1 tablet (20 mg total) by mouth daily as needed (for acid reflux.).   ferrous fumarate-iron polysaccharide complex 162-115.2 MG Caps capsule Commonly  known as:  TANDEM Take 1 capsule by mouth daily with breakfast. What changed:  additional instructions   folic acid 1 MG tablet Commonly known as:  FOLVITE Take 1 tablet (1 mg total) by mouth daily. Start taking on:  06/16/2017   furosemide 20 MG tablet Commonly known as:  LASIX Take 1 tablet (20 mg total) by mouth daily as needed.   gabapentin 300 MG capsule Commonly known as:  NEURONTIN Take 1-2 capsules (300-600 mg total) by mouth daily as needed (for pain/neuropathy). 1-2 tab by mouth at bedtime What changed:    how much to take  when to take this  additional instructions   lactulose 10 GM/15ML solution Commonly known as:  CHRONULAC Take 45 mLs (30 g total) by mouth 2 (two) times daily as needed for mild constipation.   loratadine 10 MG tablet Commonly known as:  CLARITIN Take 10 mg by mouth daily.   metFORMIN 500 MG 24 hr tablet Commonly known as:  GLUCOPHAGE-XR TAKE 4 TABLETS BY MOUTH EVERY MORNING What changed:    how much to take  how to take this  when to take this  additional instructions   methocarbamol 500 MG tablet Commonly known as:  ROBAXIN Take 1 tablet (500 mg total) by mouth  3 (three) times daily.   morphine 15 MG 12 hr tablet Commonly known as:  MS CONTIN Take 1 tablet (15 mg total) by mouth at bedtime. For 7 days ten stop this medication. STOP: 06/16/17. Additional order: 15 mg daily at bedtime for 7 days Start: 06/17/17 Stop: 06/23/17 What changed:  when to take this   nitroGLYCERIN 0.4 MG SL tablet Commonly known as:  NITROSTAT Place 1 tablet (0.4 mg total) under the tongue every 5 (five) minutes as needed for chest pain.   oxyCODONE 5 MG immediate release tablet Commonly known as:  ROXICODONE Take 1 tablet (5 mg total) by mouth every 6 (six) hours as needed for up to 3 days for severe pain.   pantoprazole 40 MG tablet Commonly known as:  PROTONIX Take 40 mg by mouth daily.   SYNTHROID 150 MCG tablet Generic drug:  levothyroxine TAKE  1 TABLET(150 MCG) BY MOUTH DAILY   Vitamin D (Ergocalciferol) 50000 units Caps capsule Commonly known as:  DRISDOL Take 1 capsule (50,000 Units total) by mouth every 7 (seven) days.       Contact information for follow-up providers    Biagio Borg, MD. Schedule an appointment as soon as possible for a visit in 2 week(s).   Specialties:  Internal Medicine, Radiology Contact information: [address] [address] [phone]        Sherren Mocha, MD .   Specialty:  Cardiology Contact information: [address] [address] [phone]            Contact information for after-discharge care    Destination    [facility] .   Service:  Skilled Nursing Contact information: [address] [place] [phone]                 Allergies  Allergen Reactions  . Prilosec [Omeprazole] Other (See Comments)    Chest pain  . Miconazole Nitrate Hives    REACTION: hives  . Crestor [Rosuvastatin Calcium] Other (See Comments)    Feeling poor  . Augmentin [Amoxicillin-Pot Clavulanate] Hives, Itching and Rash    Has patient had a PCN reaction causing immediate rash, facial/tongue/throat swelling, SOB or lightheadedness with hypotension:unsure Has patient had a PCN reaction causing severe rash involving mucus membranes or skin necrosis:unsure Has patient had a PCN reaction that required hospitalization:No Has patient had a PCN reaction occurring within the last 10 years:NO If all of the above answers are "NO", then may proceed with Cephalosporin use. Has patient had a PCN reaction causing immediate rash, facial/tongue/throat swelling, SOB or lightheadedness with hypotension:unsure Has patient had a PCN reaction causing severe rash involving mucus membranes or skin necrosis:unsure Has patient had a PCN reaction that required hospitalization:No Has patient had a PCN reaction occurring  within the last 10 years:NO If all of the above answers are "NO", then may proceed with Cephalosporin use.   [name] Doxycycline Other (See Comments)    REACTION: gi upset    Consultations:  Cardiology  Procedures/Studies: Dg Chest 2 View  Result Date: 06/11/2017 CLINICAL DATA:  Pt BIB EMS from [facility] where she is in rehab following back surgery. Pt c/o CP with sudden onset around 1430 today, substernal & non-radiating. Reports mild SOB EXAM: CHEST - 2 VIEW COMPARISON:  06/05/2017 FINDINGS: Cardiac silhouette is normal in size. Right coronary artery stent. No mediastinal or hilar masses. No convincing adenopathy Clear lungs.  No pleural  effusion or pneumothorax. There has been a long thoracolumbar fusion with pedicle screws interconnecting rods extending from T4 through the visualized lumbar spine. IMPRESSION: No acute cardiopulmonary disease. Electronically Signed   By: Lajean Manes M.D.   On: 06/11/2017 17:44   Ct Angio Chest Pe W And/or Wo Contrast  Result Date: 06/11/2017 CLINICAL DATA:  Acute onset of generalized chest pain and mild shortness of breath. EXAM: CT ANGIOGRAPHY CHEST WITH CONTRAST TECHNIQUE: Multidetector CT imaging of the chest was performed using the standard protocol during bolus administration of intravenous contrast. Multiplanar CT image reconstructions and MIPs were obtained to evaluate the vascular anatomy. CONTRAST:  183mL ISOVUE-370 IOPAMIDOL (ISOVUE-370) INJECTION 76% COMPARISON:  Chest radiograph performed earlier today at 5:29 p.m., and CT of the abdomen and pelvis performed 02/24/2017 FINDINGS: Cardiovascular: There is no evidence of significant pulmonary embolus. The heart is borderline normal in size. Diffuse coronary artery calcifications are seen. Mild calcification is noted along the aortic arch and proximal great vessels. Mediastinum/Nodes: A 1.3 cm right paratracheal node is noted. A 1.5 cm subcarinal node is suspected. No pericardial effusion is  identified. The patient is status post thyroidectomy. No axillary lymphadenopathy is seen. Lungs/Pleura: A trace left-sided pleural effusion is noted. Patchy bilateral atelectasis is noted. No pneumothorax is seen. No masses are identified. Upper Abdomen: A 3.0 cm cyst is noted at the left hepatic lobe, likely mildly increased in size. The visualized portions of the liver and spleen are otherwise unremarkable. The visualized portions of the pancreas, adrenal glands and kidneys are within normal limits. Musculoskeletal: No acute osseous abnormalities are identified. Thoracolumbar spinal fusion rods are noted. There is chronic grade 1 anterolisthesis of T9 on T10. The visualized musculature is unremarkable in appearance. Review of the MIP images confirms the above findings. IMPRESSION: 1. No evidence of significant pulmonary embolus. 2. Trace left-sided pleural effusion. Patchy bilateral atelectasis noted. Lungs otherwise grossly clear. 3. Diffuse coronary artery calcifications. 4. Nonspecific prominence of mediastinal nodes, measuring up to 1.5 cm in short axis. 5. Left hepatic cyst again noted. Electronically Signed   By: Garald Balding M.D.   On: 06/11/2017 20:40   TTE: 06/13/17  Study Conclusions  - Left ventricle: The cavity size was normal. Systolic function was normal. The estimated ejection fraction was in the range of 55% to 60%. Wall motion was normal; there were no regional wall motion abnormalities. Doppler parameters are consistent with abnormal left ventricular relaxation (grade 1 diastolic dysfunction). Doppler parameters are consistent with indeterminate ventricular filling pressure. - Aortic valve: Transvalvular velocity was within the normal range. There was no stenosis. There was no regurgitation. Valve area (VTI): 3.25 cm^2. Valve area (Vmax): 3.08 cm^2. Valve area (Vmean): 3.16 cm^2. - Mitral valve: Transvalvular velocity was within the normal range. There  was no evidence for stenosis. There was no regurgitation. Valve area by pressure half-time: 1.06 cm^2. - Left atrium: The atrium was moderately dilated. - Right ventricle: The cavity size was normal. Wall thickness was normal. Systolic function was normal. - Tricuspid valve: There was trivial regurgitation. - Pulmonary arteries: Systolic pressure was within the normal range. PA peak pressure: 20 mm Hg (S). - Pericardium, extracardiac: A trivial pericardial effusion was identified.  Subjective: No new issues, back pain controlled within reason. Chest pain continues intermittently, not exertional, worse with certain positions.   Discharge Exam: Vitals:   06/14/17 2033 06/15/17 0530  BP: (!) 98/52 113/65  Pulse: 67 69  Resp: 13 16  Temp: 98 F (36.7 C) 98.4  F (36.9 C)  SpO2: 100% 100%   General: Pt is alert, awake, not in acute distress Cardiovascular: RRR, S1/S2 +, no rubs, no gallops Respiratory: CTA bilaterally, no wheezing, no rhonchi Abdominal: Soft, NT, ND, bowel sounds + Extremities: No edema, no cyanosis MSK: Paraspinal spasm noted with incision site on back c/d/i.  Labs: BNP (last 3 results) Recent Labs    06/11/17 1658  BNP 970.2*   Basic Metabolic Panel: Recent Labs  Lab 06/11/17 1658 06/12/17 0446 06/13/17 0714  NA 141 144 143  K 3.6 3.4* 4.2  CL 105 108 109  CO2 27 25 26   GLUCOSE 105* 121* 99  BUN 5* 7 8  CREATININE 0.88 0.92 0.93  CALCIUM 7.7* 7.6* 7.5*   Liver Function Tests: Recent Labs  Lab 06/12/17 0446  AST 23  ALT 20  ALKPHOS 82  BILITOT 0.3  PROT 6.0*  ALBUMIN 2.3*   CBC: Recent Labs  Lab 06/11/17 1658 06/12/17 0446 06/13/17 0714 06/13/17 2226  WBC 7.9 8.2 7.9 9.5  HGB 7.7* 7.3* 7.7* 9.3*  HCT 25.6* 24.9* 26.3* 31.2*  MCV 73.4* 75.0* 74.9* 76.3*  PLT 408* 419* 466* 508*   Cardiac Enzymes: Recent Labs  Lab 06/11/17 2255 06/12/17 0446 06/12/17 1040  TROPONINI <0.03 <0.03 <0.03   CBG: Recent Labs  Lab  06/14/17 2035 06/14/17 2330 06/15/17 0452 06/15/17 0750 06/15/17 1145  GLUCAP 144* 114* 103* 91 120*   Anemia work up Recent Labs    06/12/17 1629  VITAMINB12 100*  FOLATE 5.6*  FERRITIN 154  TIBC 266  IRON 26*  RETICCTPCT 2.7   Urinalysis    Component Value Date/Time   COLORURINE YELLOW 06/11/2017 0345   APPEARANCEUR CLEAR 06/11/2017 0345   LABSPEC 1.038 (H) 06/11/2017 0345   PHURINE 5.0 06/11/2017 0345   GLUCOSEU NEGATIVE 06/11/2017 0345   GLUCOSEU NEGATIVE 05/29/2017 1503   HGBUR NEGATIVE 06/11/2017 0345   HGBUR negative 06/22/2007 1400   BILIRUBINUR NEGATIVE 06/11/2017 0345   BILIRUBINUR neg 11/12/2015 1813   KETONESUR NEGATIVE 06/11/2017 0345   PROTEINUR NEGATIVE 06/11/2017 0345   UROBILINOGEN 0.2 05/29/2017 1503   NITRITE NEGATIVE 06/11/2017 0345   LEUKOCYTESUR NEGATIVE 06/11/2017 0345    Microbiology Recent Results (from the past 240 hour(s))  Urine culture     Status: None   Collection Time: 06/11/17  3:45 AM  Result Value Ref Range Status   Specimen Description URINE, RANDOM  Final   Special Requests NONE  Final   Culture   Final    NO GROWTH Performed at [facility], [place] [address] [place], [place] [address]    Report Status 06/13/2017 FINAL  Final  MRSA PCR Screening     Status: None   Collection Time: 06/12/17  5:18 PM  Result Value Ref Range Status   MRSA by PCR NEGATIVE NEGATIVE Final    Comment:        The GeneXpert MRSA Assay (FDA approved for NASAL specimens only), is one component of a comprehensive MRSA colonization surveillance program. It is not intended to diagnose MRSA infection nor to guide or monitor treatment for MRSA infections. Performed at [facility], [place] [address] [place], [place] [address]     Time coordinating discharge: 41 minutes  Patrecia Pour, MD  [facility] 06/15/2017, 1:43 PM Pager [phone]

## 2017-06-15 NOTE — Progress Notes (Addendum)
1:23 pm [facility] authorization received for [facility] Facility ready to admit patient today. Paged MD. CSW to support with discharge.  11:21 am CSW met with patient at bedside and reviewed SNF offers. Patient chose [facility]. Miquel Dunn has started patient's [facility] authorization, awaiting determination - should receive later today.  CSW to support with discharge when auth given.  Estanislado Emms, [place]

## 2017-06-16 ENCOUNTER — Telehealth: Payer: Self-pay | Admitting: *Deleted

## 2017-06-16 NOTE — Telephone Encounter (Signed)
Pt was on TCM report admitted 06/11/17 to the ED from SNF for chest pain. This was felt to be atypical (tender to palpation of chest wall), and ACS ruled out with negative troponins, no ischemia on ECG, and no wall motion abnormalities on echocardiogram. D-dimer elevated but no PE on CTA chest. Has had no clinical bleeding though had 1u PRBCs given for hgb 7.7g/dl, found to be deficient in iron, vitamin B12 and folate all have been supplemented. Pt D/C 06/15/17 back to SNF. Per summary will need to f/u w/PCP after being discharge.Marland KitchenJohny Chess

## 2017-06-26 ENCOUNTER — Other Ambulatory Visit (INDEPENDENT_AMBULATORY_CARE_PROVIDER_SITE_OTHER): Payer: Medicare Other

## 2017-06-26 ENCOUNTER — Ambulatory Visit (INDEPENDENT_AMBULATORY_CARE_PROVIDER_SITE_OTHER): Payer: Medicare Other | Admitting: Internal Medicine

## 2017-06-26 ENCOUNTER — Encounter: Payer: Self-pay | Admitting: Internal Medicine

## 2017-06-26 VITALS — BP 122/76 | HR 85 | Temp 98.1°F | Ht 65.0 in | Wt 216.0 lb

## 2017-06-26 DIAGNOSIS — I10 Essential (primary) hypertension: Secondary | ICD-10-CM | POA: Diagnosis not present

## 2017-06-26 DIAGNOSIS — E11311 Type 2 diabetes mellitus with unspecified diabetic retinopathy with macular edema: Secondary | ICD-10-CM

## 2017-06-26 DIAGNOSIS — D509 Iron deficiency anemia, unspecified: Secondary | ICD-10-CM | POA: Diagnosis not present

## 2017-06-26 LAB — CBC WITH DIFFERENTIAL/PLATELET
BASOS ABS: 0 10*3/uL (ref 0.0–0.1)
Basophils Relative: 0.4 % (ref 0.0–3.0)
Eosinophils Absolute: 0.1 10*3/uL (ref 0.0–0.7)
Eosinophils Relative: 1.7 % (ref 0.0–5.0)
HEMATOCRIT: 34.7 % — AB (ref 36.0–46.0)
Hemoglobin: 11.1 g/dL — ABNORMAL LOW (ref 12.0–15.0)
LYMPHS ABS: 1.8 10*3/uL (ref 0.7–4.0)
LYMPHS PCT: 24.2 % (ref 12.0–46.0)
MCHC: 32 g/dL (ref 30.0–36.0)
MCV: 77.4 fl — AB (ref 78.0–100.0)
MONOS PCT: 8.7 % (ref 3.0–12.0)
Monocytes Absolute: 0.6 10*3/uL (ref 0.1–1.0)
NEUTROS PCT: 65 % (ref 43.0–77.0)
Neutro Abs: 4.8 10*3/uL (ref 1.4–7.7)
Platelets: 559 10*3/uL — ABNORMAL HIGH (ref 150.0–400.0)
RBC: 4.48 Mil/uL (ref 3.87–5.11)
RDW: 26 % — ABNORMAL HIGH (ref 11.5–15.5)
WBC: 7.4 10*3/uL (ref 4.0–10.5)

## 2017-06-26 LAB — BASIC METABOLIC PANEL
BUN: 9 mg/dL (ref 6–23)
CHLORIDE: 106 meq/L (ref 96–112)
CO2: 28 meq/L (ref 19–32)
CREATININE: 0.92 mg/dL (ref 0.40–1.20)
Calcium: 9.1 mg/dL (ref 8.4–10.5)
GFR: 77.2 mL/min (ref >60.00)
Glucose, Bld: 108 mg/dL — ABNORMAL HIGH (ref 70–99)
Potassium: 4.2 mEq/L (ref 3.5–5.1)
Sodium: 143 mEq/L (ref 135–145)

## 2017-06-26 NOTE — Assessment & Plan Note (Signed)
Possibly surgical related vs other, for f/u cbc today, consider Gi referral but pt declines for now

## 2017-06-26 NOTE — Patient Instructions (Addendum)

## 2017-06-26 NOTE — Progress Notes (Signed)
Subjective:    Patient ID: [name], female    DOB: 1945-07-02, 72 y.o.   MRN: [id number]  HPI  Here to f/u recent hospn for CP and anemia, ruled out for ACS and has f/u appt with PA with cardiology July 1. S/p 1 u PRBC, found to have iron def, b12 and folate deficiency as well with replacement started.  Just s/p back surgury   No other overt bleeding or bruising  Pt denies chest pain, increased sob or doe, wheezing, orthopnea, PND, palpitations, dizziness or syncope, though does have some right foot and ankle mild swelling  Pt denies new neurological symptoms such as new headache, or facial or extremity weakness or numbness   Pt denies polydipsia, polyuria, or low sugar symptoms such as weakness.  Pt states overall good compliance with meds, trying to follow lower cholesterol, diabetic diet, wt overall stable but little exercise however.   Pt continues to have recurring mid BP without change in severity, bowel or bladder change, fever, wt loss,  worsening LE pain/numbness/weakness, gait change or falls.  Pt notes Brother died at 47yo in sleep, had hx of DM.   Past Medical History:  Diagnosis Date  . AAA (abdominal aortic aneurysm) ([place])    a. s/p stent graft repair 01/2016.  [name] Allergic rhinitis, cause unspecified   . Anxiety state, unspecified   . Chronic LBP   . Coronary artery disease    a. inferior MI 1998 s/p PCI of RCA. b. stenting of Cx 04/2010. c. DES to prox LAD 05/2013. d. DES x 3 in 10/2015.  [name] Degeneration of lumbar or lumbosacral intervertebral disc   . Depressive disorder, not elsewhere classified   . Diabetes mellitus    TYPE II  . Gout 08/22/2013  . Hyperlipidemia   . Hypertension   . Hypothyroidism   . Lower GI bleed 06/2010   Diverticular bleed  . Noncompliance with medications 02/26/2014  . Obesity, unspecified    Past Surgical History:  Procedure Laterality Date  . CARDIAC CATHETERIZATION     PCI OF BOTH THE CIRCUMFLEX AND LEFT ANTERIOR DESCENDING ARTERY  . CARDIAC  CATHETERIZATION N/A 10/29/2015   Procedure: Coronary Stent Intervention;  Surgeon: Nelva Bush, MD;  Location: [facility];  Service: Cardiovascular;  Laterality: N/A;  . CARDIAC CATHETERIZATION N/A 10/29/2015   Procedure: Coronary/Graft Angiography;  Surgeon: Nelva Bush, MD;  Location: [facility];  Service: Cardiovascular;  Laterality: N/A;  . CARDIAC CATHETERIZATION N/A 10/29/2015   Procedure: Intravascular Pressure Wire/FFR Study;  Surgeon: Nelva Bush, MD;  Location: [facility];  Service: Cardiovascular;  Laterality: N/A;  . CESAREAN SECTION    . ENDOVASCULAR STENT INSERTION N/A 01/22/2016   Procedure: ABDOMINAL AORTIC ENDOVASCULAR STENT GRAFT INSERTION;  Surgeon: Rosetta Posner, MD;  Location: [facility];  Service: Vascular;  Laterality: N/A;  . HEART STENT  04-2010  and  Jun 07, 2013   X 3  . LEFT HEART CATHETERIZATION WITH CORONARY ANGIOGRAM N/A 06/07/2013   Procedure: LEFT HEART CATHETERIZATION WITH CORONARY ANGIOGRAM;  Surgeon: [name], MD;  Location: [facility];  Service: Cardiovascular;  Laterality: N/A;  . LEFT HEART CATHETERIZATION WITH CORONARY ANGIOGRAM N/A 02/25/2014   Procedure: LEFT HEART CATHETERIZATION WITH CORONARY ANGIOGRAM;  Surgeon: Troy Sine, MD;  Location: [facility] C[facility];  Service: Cardiovascular;  Laterality: N/A;  . LUMBAR FUSION  01/2007   DR. Patrice Paradise...3-LEVEL WITH FIXATION  . OOPHORECTOMY     BSO? pt.unsure  .  PARATHYROIDECTOMY    . SPINE SURGERY    . THYROIDECTOMY    . TOTAL ABDOMINAL HYSTERECTOMY      reports that she quit smoking about 31 years ago. Her smoking use included cigarettes. She quit after 30.00 years of use. She has never used smokeless tobacco. She reports that she does not drink alcohol or use drugs. family history includes Arthritis in her maternal aunt; Breast cancer in her maternal aunt; Diabetes in her brother and maternal aunt; Heart attack (age of onset: 97) in her mother; Heart attack (age of onset:  19) in her father; Heart disease in her father and mother. Allergies  Allergen Reactions  . Prilosec [Omeprazole] Other (See Comments)    Chest pain  . Miconazole Nitrate Hives    REACTION: hives  . Crestor [Rosuvastatin Calcium] Other (See Comments)    Feeling poor  . Augmentin [Amoxicillin-Pot Clavulanate] Hives, Itching and Rash    Has patient had a PCN reaction causing immediate rash, facial/tongue/throat swelling, SOB or lightheadedness with hypotension:unsure Has patient had a PCN reaction causing severe rash involving mucus membranes or skin necrosis:unsure Has patient had a PCN reaction that required hospitalization:No Has patient had a PCN reaction occurring within the last 10 years:NO If all of the above answers are "NO", then may proceed with Cephalosporin use. Has patient had a PCN reaction causing immediate rash, facial/tongue/throat swelling, SOB or lightheadedness with hypotension:unsure Has patient had a PCN reaction causing severe rash involving mucus membranes or skin necrosis:unsure Has patient had a PCN reaction that required hospitalization:No Has patient had a PCN reaction occurring within the last 10 years:NO If all of the above answers are "NO", then may proceed with Cephalosporin use.   [name] Doxycycline Other (See Comments)    REACTION: gi upset   Current Outpatient Medications on File Prior to Visit  Medication Sig Dispense Refill  . allopurinol (ZYLOPRIM) 300 MG tablet Take 1 tablet (300 mg total) by mouth daily. (Patient not taking: Reported on 06/11/2017) 90 tablet 3  . aspirin 81 MG EC tablet Take 81 mg by mouth daily.     [name] atenolol (TENORMIN) 25 MG tablet TAKE 1 TABLET(25 MG) BY MOUTH TWICE DAILY (Patient taking differently: TAKE 1 TABLET(25 MG) BY MOUTH EVERY DAY) 180 tablet 0  . atorvastatin (LIPITOR) 20 MG tablet Take 1 tablet (20 mg total) by mouth daily. (Patient not taking: Reported on 06/11/2017) 90 tablet 3  . clopidogrel (PLAVIX) 75 MG tablet Take 1  tablet (75 mg total) by mouth every evening. (Patient taking differently: Take 75 mg by mouth daily. ) 90 tablet 3  . famotidine (PEPCID) 20 MG tablet Take 1 tablet (20 mg total) by mouth daily as needed (for acid reflux.). (Patient not taking: Reported on 06/11/2017)    . ferrous fumarate-iron polysaccharide complex (TANDEM) 162-115.2 MG CAPS capsule Take 1 capsule by mouth daily with breakfast.    . folic acid (FOLVITE) 1 MG tablet Take 1 tablet (1 mg total) by mouth daily.    . furosemide (LASIX) 20 MG tablet Take 1 tablet (20 mg total) by mouth daily as needed. (Patient not taking: Reported on 06/11/2017) 90 tablet 3  . gabapentin (NEURONTIN) 300 MG capsule Take 1-2 capsules (300-600 mg total) by mouth daily as needed (for pain/neuropathy). 1-2 tab by mouth at bedtime (Patient taking differently: Take 600 mg by mouth 3 (three) times daily. )    . lactulose (CHRONULAC) 10 GM/15ML solution Take 45 mLs (30 g total) by mouth 2 (two)  times daily as needed for mild constipation. (Patient not taking: Reported on 06/11/2017) 946 mL 2  . loratadine (CLARITIN) 10 MG tablet Take 10 mg by mouth daily.    . metFORMIN (GLUCOPHAGE-XR) 500 MG 24 hr tablet TAKE 4 TABLETS BY MOUTH EVERY MORNING (Patient taking differently: Take 500 mg by mouth daily with breakfast. ) 360 tablet 3  . methocarbamol (ROBAXIN) 500 MG tablet Take 1 tablet (500 mg total) by mouth 3 (three) times daily. 9 tablet 0  . morphine (MS CONTIN) 15 MG 12 hr tablet Take 1 tablet (15 mg total) by mouth at bedtime. For 7 days ten stop this medication. STOP: 06/16/17. Additional order: 15 mg daily at bedtime for 7 days Start: 06/17/17 Stop: 06/23/17 3 tablet 0  . nitroGLYCERIN (NITROSTAT) 0.4 MG SL tablet Place 1 tablet (0.4 mg total) under the tongue every 5 (five) minutes as needed for chest pain. (Patient not taking: Reported on 06/11/2017) 25 tablet 3  . pantoprazole (PROTONIX) 40 MG tablet Take 40 mg by mouth daily.    [name] SYNTHROID 150 MCG tablet TAKE 1  TABLET(150 MCG) BY MOUTH DAILY 90 tablet 0  . vitamin B-12 1000 MCG tablet Take 1 tablet (1,000 mcg total) by mouth daily.    . Vitamin D, Ergocalciferol, (DRISDOL) 50000 units CAPS capsule Take 1 capsule (50,000 Units total) by mouth every 7 (seven) days. (Patient not taking: Reported on 06/11/2017) 12 capsule 0   No current facility-administered medications on file prior to visit.    Review of Systems  Constitutional: Negative for other unusual diaphoresis or sweats HENT: Negative for ear discharge or swelling Eyes: Negative for other worsening visual disturbances Respiratory: Negative for stridor or other swelling  Gastrointestinal: Negative for worsening distension or other blood Genitourinary: Negative for retention or other urinary change Musculoskeletal: Negative for other MSK pain or swelling Skin: Negative for color change or other new lesions Neurological: Negative for worsening tremors and other numbness  Psychiatric/Behavioral: Negative for worsening agitation or other fatigue All other system neg per pt    Objective:   Physical Exam BP 122/76   Pulse 85   Temp 98.1 F (36.7 C) (Oral)   Ht 5\' 5"  (1.651 m)   Wt 216 lb (98 kg)   SpO2 96%   BMI 35.94 kg/m  VS noted,  Constitutional: Pt appears in NAD HENT: Head: NCAT.  Right Ear: External ear normal.  Left Ear: External ear normal.  Eyes: . Pupils are equal, round, and reactive to light. Conjunctivae and EOM are normal Nose: without d/c or deformity Neck: Neck supple. Gross normal ROM Cardiovascular: Normal rate and regular rhythm.   Pulmonary/Chest: Effort normal and breath sounds without rales or wheezing.  Abd:  Soft, NT, ND, + BS, no organomegaly Neurological: Pt is alert. At baseline orientation, motor grossly intact., wearing back brace Skin: Skin is warm. No rashes, other new lesions, no LE edema Psychiatric: Pt behavior is normal without agitation  No other exam findings     Assessment & Plan:

## 2017-06-26 NOTE — Assessment & Plan Note (Signed)
stable overall by history and exam, recent data reviewed with pt, and pt to continue medical treatment as before,  to f/u any worsening symptoms or concerns BP Readings from Last 3 Encounters:  06/26/17 122/76  06/15/17 (!) 107/58  05/29/17 128/86

## 2017-06-26 NOTE — Assessment & Plan Note (Signed)
stable overall by history and exam, recent data reviewed with pt, and pt to continue medical treatment as before,  to f/u any worsening symptoms or concerns Lab Results  Component Value Date   HGBA1C 5.9 03/09/2017

## 2017-06-30 ENCOUNTER — Telehealth: Payer: Self-pay | Admitting: Internal Medicine

## 2017-06-30 NOTE — Telephone Encounter (Signed)
Copied from [place] [id number]. Topic: General - Other >> Jun 30, 2017  1:38 PM Oneta Rack wrote: Osvaldo Human name: Darlene  Relation to pt: RN from [facility]  Call back number: if today please call nurse back (m) [phone] or if Monday please call nurse Nicollette [phone]    Reason for call: requesting nursing care for 1x 3 and PT / OT eval status postlaminectomy, please advise  >> Jun 30, 2017  1:46 PM Oneta Rack wrote: Osvaldo Human name: Darlene  Relation to pt: RN from [facility]  Call back number: if today please call nurse back (m) [phone] or if Monday please call nurse Nicollette [phone]    Reason for call: requesting nursing care for 1x 3 and PT / OT eval status postlaminectomy, please advise

## 2017-06-30 NOTE — Telephone Encounter (Signed)
Verbals given via VM for [employer]

## 2017-06-30 NOTE — Telephone Encounter (Signed)
Ok for verbals 

## 2017-06-30 NOTE — Telephone Encounter (Signed)
Notified Darlene w/MD response../lmb 

## 2017-07-03 ENCOUNTER — Telehealth: Payer: Self-pay | Admitting: Internal Medicine

## 2017-07-03 NOTE — Telephone Encounter (Signed)
Verbal orders given  

## 2017-07-03 NOTE — Telephone Encounter (Signed)
Copied from [place] [phone]. Topic: Inquiry >> Jul 03, 2017  3:47 PM Oliver Pila B wrote: Reason for CRM: [facility] called for PT orders effective June 16th for 2x/wk for 4wks, contact [phone]; leave message if no answer

## 2017-07-13 ENCOUNTER — Encounter: Payer: Self-pay | Admitting: Internal Medicine

## 2017-07-13 LAB — HM DIABETES EYE EXAM

## 2017-07-17 ENCOUNTER — Ambulatory Visit (INDEPENDENT_AMBULATORY_CARE_PROVIDER_SITE_OTHER): Payer: Medicare Other | Admitting: Physician Assistant

## 2017-07-17 ENCOUNTER — Encounter: Payer: Self-pay | Admitting: Physician Assistant

## 2017-07-17 VITALS — BP 110/68 | HR 71 | Ht 65.0 in | Wt 214.6 lb

## 2017-07-17 DIAGNOSIS — I2510 Atherosclerotic heart disease of native coronary artery without angina pectoris: Secondary | ICD-10-CM

## 2017-07-17 DIAGNOSIS — D508 Other iron deficiency anemias: Secondary | ICD-10-CM | POA: Diagnosis not present

## 2017-07-17 DIAGNOSIS — R0609 Other forms of dyspnea: Secondary | ICD-10-CM

## 2017-07-17 DIAGNOSIS — E785 Hyperlipidemia, unspecified: Secondary | ICD-10-CM

## 2017-07-17 DIAGNOSIS — I10 Essential (primary) hypertension: Secondary | ICD-10-CM

## 2017-07-17 MED ORDER — FUROSEMIDE 20 MG PO TABS: 20.0000 mg | ORAL_TABLET | Freq: Every day | ORAL | 2 refills | Status: DC | PRN

## 2017-07-17 NOTE — Progress Notes (Signed)
Cardiology Office Note    Date:  07/17/2017   ID:  [name], DOB 08-Jan-1946, MRN [id number]  PCP:  Biagio Borg, MD  Cardiologist: Dr. Burt Knack   Chief Complaint: Hospital follow up   History of Present Illness:   [name] is a 72 y.o. female  with a hx of CAD (inferior MI 1998 s/p PCI of RCA, stenting of Cx 04/2010, DES to prox LAD 05/2013, DES x 3 in 10/2015), AAA s/p stent graft repair in 01/2016, anxiety, chronic LBP, DM, depression, HTN, HLD, hypothyroidism, prior lower GIB/diverticular bleed 2012, obesity, former tobacco (30-40 yrs)presents for hospital follow up.   The patient has a history of significant vascular disease with multiple prior PCI (last 2017) as well as AAA repair. She follows with Dr. Burt Knack in clinic and was last seen for preoperative evaluation in 11/2016 prior to back surgery. At that visit she reported limiting DOE and atypical chest discomfort. Her symptoms were felt to be multifactorial with a pulmonary component. She underwent nuclear stress test that showed borderline EF with small apical defect felt to be low risk.   Admitted 05/2017 for chest pain. Felt atypical, intermittent not associated with rest or activity. She was tender with palpation. Trop neg x3. D-dimer was elevated but CTA neg for PE. Echo with normal EF and no WMA. Noted anemic with hgb of 7.7>>> felt better after transfusion.   Here today for follow up.  She has back pain and right lower extremity edema since her surgery a few months ago.  Denies shortness of breath, orthopnea, PND, syncope, dizziness, melena or blood in her stool or urine.  She intermittently has mild lower sternal aching pain which resolves with "rubbing".  No exertional shortness of breath or chest pressure/pain.   Past Medical History:  Diagnosis Date  . AAA (abdominal aortic aneurysm) ([place])    a. s/p stent graft repair 01/2016.  [name] Allergic rhinitis, cause unspecified   . Anxiety state, unspecified   . Chronic  LBP   . Coronary artery disease    a. inferior MI 1998 s/p PCI of RCA. b. stenting of Cx 04/2010. c. DES to prox LAD 05/2013. d. DES x 3 in 10/2015.  [name] Degeneration of lumbar or lumbosacral intervertebral disc   . Depressive disorder, not elsewhere classified   . Diabetes mellitus    TYPE II  . Gout 08/22/2013  . Hyperlipidemia   . Hypertension   . Hypothyroidism   . Lower GI bleed 06/2010   Diverticular bleed  . Noncompliance with medications 02/26/2014  . Obesity, unspecified     Past Surgical History:  Procedure Laterality Date  . CARDIAC CATHETERIZATION     PCI OF BOTH THE CIRCUMFLEX AND LEFT ANTERIOR DESCENDING ARTERY  . CARDIAC CATHETERIZATION N/A 10/29/2015   Procedure: Coronary Stent Intervention;  Surgeon: Nelva Bush, MD;  Location: [place] [facility];  Service: Cardiovascular;  Laterality: N/A;  . CARDIAC CATHETERIZATION N/A 10/29/2015   Procedure: Coronary/Graft Angiography;  Surgeon: Nelva Bush, MD;  Location: [place] [facility];  Service: Cardiovascular;  Laterality: N/A;  . CARDIAC CATHETERIZATION N/A 10/29/2015   Procedure: Intravascular Pressure Wire/FFR Study;  Surgeon: Nelva Bush, MD;  Location: [place] [facility];  Service: Cardiovascular;  Laterality: N/A;  . CESAREAN SECTION    . ENDOVASCULAR STENT INSERTION N/A 01/22/2016   Procedure: ABDOMINAL AORTIC ENDOVASCULAR STENT GRAFT INSERTION;  Surgeon: Rosetta Posner, MD;  Location: [place];  Service: Vascular;  Laterality: N/A;  .  HEART STENT  04-2010  and  Jun 07, 2013   X 3  . LEFT HEART CATHETERIZATION WITH CORONARY ANGIOGRAM N/A 06/07/2013   Procedure: LEFT HEART CATHETERIZATION WITH CORONARY ANGIOGRAM;  Surgeon: [name], MD;  Location: [facility];  Service: Cardiovascular;  Laterality: N/A;  . LEFT HEART CATHETERIZATION WITH CORONARY ANGIOGRAM N/A 02/25/2014   Procedure: LEFT HEART CATHETERIZATION WITH CORONARY ANGIOGRAM;  Surgeon: Troy Sine, MD;  [facility] C[facility];  Service:  Cardiovascular;  Laterality: N/A;  . LUMBAR FUSION  01/2007   DR. Patrice Paradise...3-LEVEL WITH FIXATION  . OOPHORECTOMY     BSO? pt.unsure  . PARATHYROIDECTOMY    . SPINE SURGERY    . THYROIDECTOMY    . TOTAL ABDOMINAL HYSTERECTOMY      Current Medications:  Prior to Admission medications   Medication Sig Start Date End Date Taking? Authorizing Provider  allopurinol (ZYLOPRIM) 300 MG tablet Take 1 tablet (300 mg total) by mouth daily. 04/14/16  Yes Biagio Borg, MD  aspirin 81 MG EC tablet Take 81 mg by mouth daily.    Yes [provider]  atenolol (TENORMIN) 25 MG tablet TAKE 1 TABLET(25 MG) BY MOUTH TWICE DAILY Patient taking differently: TAKE 1 TABLET(25 MG) BY MOUTH EVERY DAY 04/24/17  Yes Biagio Borg, MD  clopidogrel (PLAVIX) 75 MG tablet Take 1 tablet (75 mg total) by mouth every evening. Patient taking differently: Take 75 mg by mouth daily.  12/19/16  Yes Sherren Mocha, MD  furosemide (LASIX) 20 MG tablet Take 1 tablet (20 mg total) by mouth daily as needed. 05/29/17  Yes Biagio Borg, MD  gabapentin (NEURONTIN) 300 MG capsule Take 1-2 capsules (300-600 mg total) by mouth daily as needed (for pain/neuropathy). 1-2 tab by mouth at bedtime Patient taking differently: Take 600 mg by mouth 3 (three) times daily.  01/22/16  Yes Rhyne, Hulen Shouts, PA-C  lactulose (CHRONULAC) 10 GM/15ML solution Take 45 mLs (30 g total) by mouth 2 (two) times daily as needed for mild constipation. 03/09/17  Yes Biagio Borg, MD  loratadine (CLARITIN) 10 MG tablet Take 10 mg by mouth daily.   Yes [provider]  metFORMIN (GLUCOPHAGE-XR) 500 MG 24 hr tablet TAKE 4 TABLETS BY MOUTH EVERY MORNING Patient taking differently: Take 500 mg by mouth daily with breakfast.  01/11/17  Yes Biagio Borg, MD  methocarbamol (ROBAXIN) 500 MG tablet Take 1 tablet (500 mg total) by mouth 3 (three) times daily. 06/15/17  Yes Patrecia Pour, MD  morphine (MS CONTIN) 15 MG 12 hr tablet Take 1 tablet (15 mg total) by  mouth at bedtime. For 7 days ten stop this medication. STOP: 06/16/17. Additional order: 15 mg daily at bedtime for 7 days Start: 06/17/17 Stop: 06/23/17 06/15/17  Yes Patrecia Pour, MD  nitroGLYCERIN (NITROSTAT) 0.4 MG SL tablet Place 1 tablet (0.4 mg total) under the tongue every 5 (five) minutes as needed for chest pain. 11/09/15  Yes Weaver, Scott T, PA-C  pantoprazole (PROTONIX) 40 MG tablet Take 40 mg by mouth daily.   Yes [provider]  SYNTHROID 150 MCG tablet TAKE 1 TABLET(150 MCG) BY MOUTH DAILY 05/02/17  Yes Biagio Borg, MD  vitamin B-12 1000 MCG tablet Take 1 tablet (1,000 mcg total) by mouth daily. 06/16/17  Yes Patrecia Pour, MD  Vitamin D, Ergocalciferol, (DRISDOL) 50000 units CAPS capsule Take 1 capsule (50,000 Units total) by mouth every 7 (seven) days. 10/06/15  Yes Hulan Saas  M, DO    Allergies:   Prilosec [omeprazole]; Miconazole nitrate; Crestor [rosuvastatin calcium]; Augmentin [amoxicillin-pot clavulanate]; and Doxycycline   Social History   Socioeconomic History  . Marital status: Divorced    Spouse name: Not on file  . Number of children: 3  . Years of education: Not on file  . Highest education level: Not on file  Occupational History  . 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 Financial resource strain: Not on file  . Food insecurity:    Worry: Not on file    Inability: Not on file  . Transportation needs:    Medical: Not on file    Non-medical: Not on file  Tobacco Use  . Smoking status: Former Smoker    Years: 30.00    Types: Cigarettes    Last attempt to quit: 05/31/1986    Years since quitting: 31.1  . Smokeless tobacco: Never Used  Substance and Sexual Activity  . Alcohol use: No  . Drug use: No  . Sexual activity: Not Currently    Comment: 1st intercourse 72 yo-Fewer than 5 partners  Lifestyle  . Physical activity:    Days per week: Not on file    Minutes  per session: Not on file  . Stress: Not on file  Relationships  . Social connections:    Talks on phone: Not on file    Gets together: Not on file    Attends religious service: Not on file    Active member of club or organization: Not on file    Attends meetings of clubs or organizations: Not on file    Relationship status: Not on file  Other Topics Concern  . Not on file  Social History Narrative   DIVORCED   3 CHILDREN   PATIENT SIGNED A DESIGNATED PARTY RELEASE TO ALLOW HER DAUGHTER, TRAMAINE Pettitt, TO HAVE ACCESS TO HER MEDICAL RECORDS/INFORMATION. [name], May 04, 2009 @ 3:27 PM   Smokes cigarettes on rare occasions.     Family History:  The patient's family history includes Arthritis in her maternal aunt; Breast cancer in her maternal aunt; Diabetes in her brother and maternal aunt; Heart attack (age of onset: 10) in her mother; Heart attack (age of onset: 36) in her father; Heart disease in her father and mother.  ROS:   Please see the history of present illness.    ROS All other systems reviewed and are negative.   PHYSICAL EXAM:   VS:  BP 110/68   Pulse 71   Ht 5\' 5"  (1.651 m)   Wt 214 lb 9.6 oz (97.3 kg)   SpO2 96%   BMI 35.71 kg/m    GEN: Well nourished, well developed, in no acute distress but has lower back pain HEENT: normal  Neck: no JVD, carotid bruits, or masses Cardiac:RRR; no murmurs, rubs, or gallops, trace right lower extremity edema  Respiratory:  clear to auscultation bilaterally, normal work of breathing GI: soft, nontender, nondistended, + BS MS: no deformity or atrophy  Skin: warm and dry, no rash Neuro:  Alert and Oriented x 3, Strength and sensation are intact Psych: euthymic mood, full affect  Wt Readings from Last 3 Encounters:  07/17/17 214 lb 9.6 oz (97.3 kg)  06/26/17 216 lb (98 kg)  06/15/17 227 lb 4.7 oz (103.1 kg)      Studies/Labs Reviewed:   EKG:  EKG is not ordered today.    Recent Labs: 12/06/2016: NT-Pro BNP  47 03/09/2017: TSH 0.51 06/11/2017: B Natriuretic Peptide 110.3 06/12/2017: ALT 20 06/26/2017: BUN 9; Creatinine, Ser 0.92; Hemoglobin 11.1; Platelets 559.0; Potassium 4.2; Sodium 143   Lipid Panel    Component Value Date/Time   CHOL 138 03/09/2017 1040   CHOL 181 11/30/2016 1053   TRIG 131.0 03/09/2017 1040   TRIG 102 01/02/2006 0825   HDL 28.30 (L) 03/09/2017 1040   HDL 29 (L) 11/30/2016 1053   CHOLHDL 5 03/09/2017 1040   VLDL 26.2 03/09/2017 1040   LDLCALC 84 03/09/2017 1040   LDLCALC 123 (H) 11/30/2016 1053   LDLDIRECT 131.0 03/27/2015 1137    Additional studies/ records that were reviewed today include:   Echocardiogram:06/13/17  Study Conclusions  - Left ventricle: The cavity size was normal. Systolic function was normal. The estimated ejection fraction was in the range of 55% to 60%. Wall motion was normal; there were no regional wall motion abnormalities. Doppler parameters are consistent with abnormal left ventricular relaxation (grade 1 diastolic dysfunction). Doppler parameters are consistent with indeterminate ventricular filling pressure. - Aortic valve: Transvalvular velocity was within the normal range. There was no stenosis. There was no regurgitation. Valve area (VTI): 3.25 cm^2. Valve area (Vmax): 3.08 cm^2. Valve area (Vmean): 3.16 cm^2. - Mitral valve: Transvalvular velocity was within the normal range. There was no evidence for stenosis. There was no regurgitation. Valve area by pressure half-time: 1.06 cm^2. - Left atrium: The atrium was moderately dilated. - Right ventricle: The cavity size was normal. Wall thickness was normal. Systolic function was normal. - Tricuspid valve: There was trivial regurgitation. - Pulmonary arteries: Systolic pressure was within the normal range. PA peak pressure: 20 mm Hg (S). - Pericardium, extracardiac: A trivial pericardial effusion was identified.  Stress test 11/2016  Nuclear  stress EF: 50%. No wall motion abnormalities  There was no ST segment deviation noted during stress.  Defect 1: There is a small defect of mild severity present in the apical septal and apex location. This may be indicative of breast attenuation artifact.  This is a low risk study. No large areas of ischemia noted   Coronary Stent Intervention   10/2015  Intravascular Pressure Wire/FFR Study  Coronary/Graft Angiography  Conclusion   Conclusions: 1. Multivessel coronary artery disease, including 60% ostial and 70% proximal/mid RCA disease that is hemodynamically significant by FFR (0.71).  Mild in-stent restenosis of LAD stents, as well as 60% stenosis in the mid LCx adjacent to previously stented large OM are also present. 2. Successful FFR-guided PCI of the RCA from the ostium to the midvessel with three overlapping Promus Premier stents post-dilated to 3.25 mm with 0% residual stenosis and TIMI-3 flow. 3. Patient had significant pain chest pain beginning with FFR and continuing throughout intervention despite aggressive sedation and analgesia.  No clear angiographic or electrocardiographic etiology was identified.  Pain had returned to pre-cath level by the end of the procedure.  Recommendations: 1. Transfer to 2-Heart for aggressive medical management of chest pain, including weaning of NTG infusion as tolerated. 2. Dual antiplatelet therapy with aspirin and ticagrelor for at least 12 months, ideally longer. 3. Aggressive secondary prevention. 4. If patient continues to have chest pain, consider repeat catheterization with FFR and/or PCI to mid LCx. 5. Due to marked tortuosity of the innominate/right subclavian artery, consider alternate vascular access for further cardiac catheterizations.    ASSESSMENT & PLAN:    1. CAD s/p multiple  PCI -No anginal type chest pain or shortness of breath.  She has mild lower sternal/epigastric achiness which resolved with rubbing.  Seems muscle  achiness due to back surgery.  Continue aspirin and Plavix.   2. AAA s/p repair - Followed by [facility]  3. HLD - 03/09/2017: Cholesterol 138; HDL 28.30; LDL Cholesterol 84; Triglycerides 131.0; VLDL 26.2  - Continue Lipitor  4. HTN - Stable on current medications.   5. IDA with recent transfusion - She is feeling weak since last admission. Recent chills without fever. Will check CBC.   6. R lower extremity edema - Noted since back surgery. Compliant with low sodium diet. Try PRN lasix.   Medication Adjustments/Labs and Tests Ordered: Current medicines are reviewed at length with the patient today.  Concerns regarding medicines are outlined above.  Medication changes, Labs and Tests ordered today are listed in the Patient Instructions below. Patient Instructions  Medication Instructions:  Your physician has recommended you make the following change in your medication:  1.  START Lasix 20 mg taking 1 tablet only as needed for lower extremity swelling   Labwork: TODAY:  CBC W/DIFF  Testing/Procedures: None ordered  Follow-Up: Your physician recommends that you schedule a follow-up appointment in: [place]   Any Other Special Instructions Will Be Listed Below (If Applicable).     If you need a refill on your cardiac medications before your next appointment, please call your pharmacy.      Jarrett Soho, [place]  07/17/2017 2:00 PM    [address], [place], [place]  [address] Phone: [phone]; Fax: [phone]

## 2017-07-17 NOTE — Patient Instructions (Signed)
Medication Instructions:  Your physician has recommended you make the following change in your medication:  1.  START Lasix 20 mg taking 1 tablet only as needed for lower extremity swelling   Labwork: TODAY:  CBC W/DIFF  Testing/Procedures: None ordered  Follow-Up: Your physician recommends that you schedule a follow-up appointment in: [place]   Any Other Special Instructions Will Be Listed Below (If Applicable).     If you need a refill on your cardiac medications before your next appointment, please call your pharmacy.

## 2017-07-18 LAB — CBC WITH DIFFERENTIAL/PLATELET
BASOS ABS: 0 10*3/uL (ref 0.0–0.2)
BASOS: 0 %
EOS (ABSOLUTE): 0.1 10*3/uL (ref 0.0–0.4)
Eos: 2 %
Hematocrit: 36.7 % (ref 34.0–46.6)
Hemoglobin: 11 g/dL — ABNORMAL LOW (ref 11.1–15.9)
IMMATURE GRANS (ABS): 0 10*3/uL (ref 0.0–0.1)
IMMATURE GRANULOCYTES: 0 %
LYMPHS: 29 %
Lymphocytes Absolute: 2.2 10*3/uL (ref 0.7–3.1)
MCH: 23.9 pg — ABNORMAL LOW (ref 26.6–33.0)
MCHC: 30 g/dL — ABNORMAL LOW (ref 31.5–35.7)
MCV: 80 fL (ref 79–97)
MONOS ABS: 0.6 10*3/uL (ref 0.1–0.9)
Monocytes: 8 %
Neutrophils Absolute: 4.8 10*3/uL (ref 1.4–7.0)
Neutrophils: 61 %
PLATELETS: 433 10*3/uL (ref 150–450)
RBC: 4.6 x10E6/uL (ref 3.77–5.28)
RDW: 20.6 % — AB (ref 12.3–15.4)
WBC: 7.8 10*3/uL (ref 3.4–10.8)

## 2017-07-19 ENCOUNTER — Telehealth: Payer: Self-pay | Admitting: Internal Medicine

## 2017-07-19 ENCOUNTER — Ambulatory Visit (INDEPENDENT_AMBULATORY_CARE_PROVIDER_SITE_OTHER): Payer: Medicare Other | Admitting: Internal Medicine

## 2017-07-19 ENCOUNTER — Ambulatory Visit (HOSPITAL_COMMUNITY)
Admission: RE | Admit: 2017-07-19 | Discharge: 2017-07-19 | Disposition: A | Discharge status: Home / Self Care | Payer: Medicare Other | Source: Ambulatory Visit | Attending: Internal Medicine | Admitting: Internal Medicine

## 2017-07-19 ENCOUNTER — Encounter: Payer: Self-pay | Admitting: Internal Medicine

## 2017-07-19 VITALS — BP 106/70 | HR 75 | Temp 98.0°F | Ht 65.0 in | Wt 211.0 lb

## 2017-07-19 DIAGNOSIS — M7989 Other specified soft tissue disorders: Secondary | ICD-10-CM | POA: Diagnosis not present

## 2017-07-19 DIAGNOSIS — M79604 Pain in right leg: Secondary | ICD-10-CM | POA: Diagnosis not present

## 2017-07-19 DIAGNOSIS — M7918 Myalgia, other site: Secondary | ICD-10-CM

## 2017-07-19 DIAGNOSIS — H9193 Unspecified hearing loss, bilateral: Secondary | ICD-10-CM

## 2017-07-19 DIAGNOSIS — E538 Deficiency of other specified B group vitamins: Secondary | ICD-10-CM | POA: Insufficient documentation

## 2017-07-19 DIAGNOSIS — I872 Venous insufficiency (chronic) (peripheral): Secondary | ICD-10-CM

## 2017-07-19 HISTORY — DX: Venous insufficiency (chronic) (peripheral): I87.2

## 2017-07-19 HISTORY — DX: Unspecified hearing loss, bilateral: H91.93

## 2017-07-19 MED ORDER — CYANOCOBALAMIN 1000 MCG/ML IJ SOLN
1000.0000 ug | Freq: Once | INTRAMUSCULAR | Status: AC
  Administered 2017-07-19: 1000 ug via INTRAMUSCULAR

## 2017-07-19 MED ORDER — KETOROLAC TROMETHAMINE 30 MG/ML IJ SOLN
30.0000 mg | Freq: Once | INTRAMUSCULAR | Status: AC
  Administered 2017-07-19: 30 mg via INTRAMUSCULAR

## 2017-07-19 MED ORDER — GABAPENTIN 300 MG PO CAPS: ORAL_CAPSULE | ORAL | 1 refills | Status: DC

## 2017-07-19 NOTE — Assessment & Plan Note (Signed)
With chronic lbp - for toradol 30 IM x 1

## 2017-07-19 NOTE — Telephone Encounter (Signed)
Katharine Look with [facility] states patient is negetive for DVT

## 2017-07-19 NOTE — Assessment & Plan Note (Addendum)
Etiology unclear, does not seem overtly joint related today, cant r/o dvt - will refer for dopplers venous today  Note:  Total time for pt hx, exam, review of record with pt in the room, determination of diagnoses and plan for further eval and tx is > 40 min, with over 50% spent in coordination and counseling of patient including the differential dx, tx, further evaluation and other management of RLE swelling and pain, B12 deficiency, bilateral hearing loss and chronic pain

## 2017-07-19 NOTE — Patient Instructions (Signed)
You will be contacted regarding the referral for: leg circulation testing - to see [facility] now  Your ears were irrigated of wax today  You had the B12 shot and pain shot (toradol) today  Please continue all other medications as before, and refills have been done if requested - the gabapentin  Please have the pharmacy call with any other refills you may need.  Please continue your efforts at being more active, low cholesterol diet, and weight control.  Please keep your appointments with your specialists as you may have planned

## 2017-07-19 NOTE — Progress Notes (Signed)
Subjective:    Patient ID: [name], female    DOB: 16-Mar-1945, 72 y.o.   MRN: [id number]  HPI here with c/o persistent x weeks swelling to RLE with little to no pain and swelling worst at the ankle and foot, but just does not seem like painful DJD or gout attack.  Walks with cane no change, no falls or trauma, or fever.  Pt denies chest pain, increased sob or doe, wheezing, orthopnea, PND, increased LLE swelling, palpitations, dizziness or syncope. Also c/o bilat hearing loss in the past wk - ? Wax again, asks for irrigation.  Asks for B12 shot since < 100 at last hospn, and Pt continues to have recurring LBP without change in severity, bowel or bladder change, fever, wt loss,  worsening LE pain/numbness/weakness, gait change or falls. Past Medical History:  Diagnosis Date  . AAA (abdominal aortic aneurysm) ([place])    a. s/p stent graft repair 01/2016.  [name] Allergic rhinitis, cause unspecified   . Anxiety state, unspecified   . Chronic LBP   . Coronary artery disease    a. inferior MI 1998 s/p PCI of RCA. b. stenting of Cx 04/2010. c. DES to prox LAD 05/2013. d. DES x 3 in 10/2015.  [name] Degeneration of lumbar or lumbosacral intervertebral disc   . Depressive disorder, not elsewhere classified   . Diabetes mellitus    TYPE II  . Gout 08/22/2013  . Hyperlipidemia   . Hypertension   . Hypothyroidism   . Lower GI bleed 06/2010   Diverticular bleed  . Noncompliance with medications 02/26/2014  . Obesity, unspecified    Past Surgical History:  Procedure Laterality Date  . CARDIAC CATHETERIZATION     PCI OF BOTH THE CIRCUMFLEX AND LEFT ANTERIOR DESCENDING ARTERY  . CARDIAC CATHETERIZATION N/A 10/29/2015   Procedure: Coronary Stent Intervention;  Surgeon: Nelva Bush, MD;  Location: [facility];  Service: Cardiovascular;  Laterality: N/A;  . CARDIAC CATHETERIZATION N/A 10/29/2015   Procedure: Coronary/Graft Angiography;  Surgeon: Nelva Bush, MD;  Location: [facility];   Service: Cardiovascular;  Laterality: N/A;  . CARDIAC CATHETERIZATION N/A 10/29/2015   Procedure: Intravascular Pressure Wire/FFR Study;  Surgeon: Nelva Bush, MD;  Location: [facility];  Service: Cardiovascular;  Laterality: N/A;  . CESAREAN SECTION    . ENDOVASCULAR STENT INSERTION N/A 01/22/2016   Procedure: ABDOMINAL AORTIC ENDOVASCULAR STENT GRAFT INSERTION;  Surgeon: Rosetta Posner, MD;  Location: [facility];  Service: Vascular;  Laterality: N/A;  . HEART STENT  04-2010  and  Jun 07, 2013   X 3  . LEFT HEART CATHETERIZATION WITH CORONARY ANGIOGRAM N/A 06/07/2013   Procedure: LEFT HEART CATHETERIZATION WITH CORONARY ANGIOGRAM;  Surgeon: [name], MD;  Location: [facility] CATH LAB;  Service: Cardiovascular;  Laterality: N/A;  . LEFT HEART CATHETERIZATION WITH CORONARY ANGIOGRAM N/A 02/25/2014   Procedure: LEFT HEART CATHETERIZATION WITH CORONARY ANGIOGRAM;  Surgeon: Troy Sine, MD;  Location: [facility] CATH LAB;  Service: Cardiovascular;  Laterality: N/A;  . LUMBAR FUSION  01/2007   DR. Patrice Paradise...3-LEVEL WITH FIXATION  . OOPHORECTOMY     BSO? pt.unsure  . PARATHYROIDECTOMY    . SPINE SURGERY    . THYROIDECTOMY    . TOTAL ABDOMINAL HYSTERECTOMY      reports that she quit smoking about 31 years ago. Her smoking use included cigarettes. She quit after 30.00 years of use. She has never used smokeless tobacco. She reports that she does not drink  alcohol or use drugs. family history includes Arthritis in her maternal aunt; Breast cancer in her maternal aunt; Diabetes in her brother and maternal aunt; Heart attack (age of onset: 66) in her mother; Heart attack (age of onset: 46) in her father; Heart disease in her father and mother. Allergies  Allergen Reactions  . Prilosec [Omeprazole] Other (See Comments)    Chest pain  . Miconazole Nitrate Hives    REACTION: hives  . Crestor [Rosuvastatin Calcium] Other (See Comments)    Feeling poor  . Augmentin [Amoxicillin-Pot Clavulanate] Hives,  Itching and Rash    Has patient had a PCN reaction causing immediate rash, facial/tongue/throat swelling, SOB or lightheadedness with hypotension:unsure Has patient had a PCN reaction causing severe rash involving mucus membranes or skin necrosis:unsure Has patient had a PCN reaction that required hospitalization:No Has patient had a PCN reaction occurring within the last 10 years:NO If all of the above answers are "NO", then may proceed with Cephalosporin use. Has patient had a PCN reaction causing immediate rash, facial/tongue/throat swelling, SOB or lightheadedness with hypotension:unsure Has patient had a PCN reaction causing severe rash involving mucus membranes or skin necrosis:unsure Has patient had a PCN reaction that required hospitalization:No Has patient had a PCN reaction occurring within the last 10 years:NO If all of the above answers are "NO", then may proceed with Cephalosporin use.   [name] Doxycycline Other (See Comments)    REACTION: gi upset   Current Outpatient Medications on File Prior to Visit  Medication Sig Dispense Refill  . allopurinol (ZYLOPRIM) 300 MG tablet Take 1 tablet (300 mg total) by mouth daily. 90 tablet 3  . aspirin 81 MG EC tablet Take 81 mg by mouth daily.     [name] atenolol (TENORMIN) 25 MG tablet TAKE 1 TABLET(25 MG) BY MOUTH TWICE DAILY (Patient taking differently: TAKE 1 TABLET(25 MG) BY MOUTH EVERY DAY) 180 tablet 0  . clopidogrel (PLAVIX) 75 MG tablet Take 1 tablet (75 mg total) by mouth every evening. (Patient taking differently: Take 75 mg by mouth daily. ) 90 tablet 3  . furosemide (LASIX) 20 MG tablet Take 1 tablet (20 mg total) by mouth daily as needed. 30 tablet 2  . gabapentin (NEURONTIN) 300 MG capsule Take 1-2 capsules (300-600 mg total) by mouth daily as needed (for pain/neuropathy). 1-2 tab by mouth at bedtime (Patient taking differently: Take 600 mg by mouth 3 (three) times daily. )    . lactulose (CHRONULAC) 10 GM/15ML solution Take 45 mLs (30  g total) by mouth 2 (two) times daily as needed for mild constipation. 946 mL 2  . loratadine (CLARITIN) 10 MG tablet Take 10 mg by mouth daily.    . metFORMIN (GLUCOPHAGE-XR) 500 MG 24 hr tablet TAKE 4 TABLETS BY MOUTH EVERY MORNING (Patient taking differently: Take 500 mg by mouth daily with breakfast. ) 360 tablet 3  . methocarbamol (ROBAXIN) 500 MG tablet Take 1 tablet (500 mg total) by mouth 3 (three) times daily. 9 tablet 0  . morphine (MS CONTIN) 15 MG 12 hr tablet Take 1 tablet (15 mg total) by mouth at bedtime. For 7 days ten stop this medication. STOP: 06/16/17. Additional order: 15 mg daily at bedtime for 7 days Start: 06/17/17 Stop: 06/23/17 3 tablet 0  . nitroGLYCERIN (NITROSTAT) 0.4 MG SL tablet Place 1 tablet (0.4 mg total) under the tongue every 5 (five) minutes as needed for chest pain. 25 tablet 3  . pantoprazole (PROTONIX) 40 MG tablet Take 40 mg by  mouth daily.    [name] SYNTHROID 150 MCG tablet TAKE 1 TABLET(150 MCG) BY MOUTH DAILY 90 tablet 0  . vitamin B-12 1000 MCG tablet Take 1 tablet (1,000 mcg total) by mouth daily.    . Vitamin D, Ergocalciferol, (DRISDOL) 50000 units CAPS capsule Take 1 capsule (50,000 Units total) by mouth every 7 (seven) days. 12 capsule 0   No current facility-administered medications on file prior to visit.    Review of Systems  Constitutional: Negative for other unusual diaphoresis or sweats HENT: Negative for ear discharge or swelling Eyes: Negative for other worsening visual disturbances Respiratory: Negative for stridor or other swelling  Gastrointestinal: Negative for worsening distension or other blood Genitourinary: Negative for retention or other urinary change Musculoskeletal: Negative for other MSK pain or swelling Skin: Negative for color change or other new lesions Neurological: Negative for worsening tremors and other numbness  Psychiatric/Behavioral: Negative for worsening agitation or other fatigue All other system neg per pt      Objective:   Physical Exam BP 106/70   Pulse 75   Temp 98 F (36.7 C) (Oral)   Ht 5\' 5"  (1.651 m)   Wt 211 lb (95.7 kg)   SpO2 97%   BMI 35.11 kg/m  VS noted,  Constitutional: Pt appears in NAD HENT: Head: NCAT.  Right Ear: External ear normal.  Left Ear: External ear normal.  Bilat ear canals cleared of wax impaction, hearing improved Eyes: . Pupils are equal, round, and reactive to light. Conjunctivae and EOM are normal Nose: without d/c or deformity Neck: Neck supple. Gross normal ROM Cardiovascular: Normal rate and regular rhythm.   Pulmonary/Chest: Effort normal and breath sounds without rales or wheezing.  Abd:  Soft, NT, ND, + BS, no organomegaly Neurological: Pt is alert. At baseline orientation, motor grossly intact Skin: Skin is warm. No rashes, other new lesions, has RLE edema trace to 1+ to knee but 2+ to ankle and foot, no specific joint involvement this time  Psychiatric: Pt behavior is normal without agitation  No other exam findings Lab Results  Component Value Date   WBC 7.8 07/17/2017   HGB 11.0 (L) 07/17/2017   HCT 36.7 07/17/2017   PLT 433 07/17/2017   GLUCOSE 108 (H) 06/26/2017   CHOL 138 03/09/2017   TRIG 131.0 03/09/2017   HDL 28.30 (L) 03/09/2017   LDLDIRECT 131.0 03/27/2015   LDLCALC 84 03/09/2017   ALT 20 06/12/2017   AST 23 06/12/2017   NA 143 06/26/2017   K 4.2 06/26/2017   CL 106 06/26/2017   CREATININE 0.92 06/26/2017   BUN 9 06/26/2017   CO2 28 06/26/2017   TSH 0.51 03/09/2017   INR 1.08 01/22/2016   HGBA1C 5.9 03/09/2017   MICROALBUR 11.8 (H) 03/09/2017        Assessment & Plan:

## 2017-07-19 NOTE — Assessment & Plan Note (Signed)
Improved with irrigation of wax impactions

## 2017-07-19 NOTE — Assessment & Plan Note (Signed)
For b12 1000 mg IM today 

## 2017-08-22 ENCOUNTER — Ambulatory Visit (INDEPENDENT_AMBULATORY_CARE_PROVIDER_SITE_OTHER): Payer: Medicare Other | Admitting: *Deleted

## 2017-08-22 DIAGNOSIS — E538 Deficiency of other specified B group vitamins: Secondary | ICD-10-CM

## 2017-08-22 MED ORDER — CYANOCOBALAMIN 1000 MCG/ML IJ SOLN
1000.0000 ug | Freq: Once | INTRAMUSCULAR | Status: AC
  Administered 2017-08-22: 1000 ug via INTRAMUSCULAR

## 2017-08-22 NOTE — Progress Notes (Signed)
Medical screening examination/treatment/procedure(s) were performed by non-physician practitioner and as supervising physician I was immediately available for consultation/collaboration. I agree with above. Ascher Schroepfer, MD   

## 2017-08-30 ENCOUNTER — Other Ambulatory Visit: Payer: Self-pay | Admitting: Physician Assistant

## 2017-08-30 ENCOUNTER — Other Ambulatory Visit: Payer: Self-pay | Admitting: Internal Medicine

## 2017-08-30 DIAGNOSIS — I2510 Atherosclerotic heart disease of native coronary artery without angina pectoris: Secondary | ICD-10-CM

## 2017-09-06 ENCOUNTER — Encounter: Payer: Self-pay | Admitting: Internal Medicine

## 2017-09-06 ENCOUNTER — Other Ambulatory Visit (INDEPENDENT_AMBULATORY_CARE_PROVIDER_SITE_OTHER): Payer: Medicare Other

## 2017-09-06 ENCOUNTER — Ambulatory Visit (INDEPENDENT_AMBULATORY_CARE_PROVIDER_SITE_OTHER): Payer: Medicare Other | Admitting: Internal Medicine

## 2017-09-06 VITALS — BP 104/62 | HR 80 | Temp 97.9°F | Ht 65.0 in | Wt 213.0 lb

## 2017-09-06 DIAGNOSIS — E782 Mixed hyperlipidemia: Secondary | ICD-10-CM

## 2017-09-06 DIAGNOSIS — M545 Low back pain: Secondary | ICD-10-CM | POA: Diagnosis not present

## 2017-09-06 DIAGNOSIS — D509 Iron deficiency anemia, unspecified: Secondary | ICD-10-CM | POA: Diagnosis not present

## 2017-09-06 DIAGNOSIS — E11311 Type 2 diabetes mellitus with unspecified diabetic retinopathy with macular edema: Secondary | ICD-10-CM

## 2017-09-06 DIAGNOSIS — I10 Essential (primary) hypertension: Secondary | ICD-10-CM

## 2017-09-06 LAB — BASIC METABOLIC PANEL
BUN: 15 mg/dL (ref 6–23)
CALCIUM: 9.4 mg/dL (ref 8.4–10.5)
CO2: 28 mEq/L (ref 19–32)
CREATININE: 1 mg/dL (ref 0.40–1.20)
Chloride: 104 mEq/L (ref 96–112)
GFR: 70.08 mL/min (ref >60.00)
Glucose, Bld: 119 mg/dL — ABNORMAL HIGH (ref 70–99)
Potassium: 3.9 mEq/L (ref 3.5–5.1)
Sodium: 140 mEq/L (ref 135–145)

## 2017-09-06 LAB — HEMOGLOBIN A1C: HEMOGLOBIN A1C: 6.3 % (ref 4.6–6.5)

## 2017-09-06 LAB — CBC WITH DIFFERENTIAL/PLATELET
BASOS PCT: 0.8 % (ref 0.0–3.0)
Basophils Absolute: 0 10*3/uL (ref 0.0–0.1)
Eosinophils Absolute: 0.1 10*3/uL (ref 0.0–0.7)
Eosinophils Relative: 2.1 % (ref 0.0–5.0)
HEMATOCRIT: 39.4 % (ref 36.0–46.0)
HEMOGLOBIN: 12.4 g/dL (ref 12.0–15.0)
LYMPHS PCT: 29.6 % (ref 12.0–46.0)
Lymphs Abs: 1.8 10*3/uL (ref 0.7–4.0)
MCHC: 31.5 g/dL (ref 30.0–36.0)
MCV: 76 fl — ABNORMAL LOW (ref 78.0–100.0)
Monocytes Absolute: 0.4 10*3/uL (ref 0.1–1.0)
Monocytes Relative: 6.9 % (ref 3.0–12.0)
NEUTROS ABS: 3.8 10*3/uL (ref 1.4–7.7)
Neutrophils Relative %: 60.6 % (ref 43.0–77.0)
PLATELETS: 368 10*3/uL (ref 150.0–400.0)
RBC: 5.19 Mil/uL — ABNORMAL HIGH (ref 3.87–5.11)
RDW: 19.3 % — AB (ref 11.5–15.5)
WBC: 6.2 10*3/uL (ref 4.0–10.5)

## 2017-09-06 LAB — HEPATIC FUNCTION PANEL
ALT: 9 U/L (ref 0–35)
AST: 13 U/L (ref 0–37)
Albumin: 3.9 g/dL (ref 3.5–5.2)
Alkaline Phosphatase: 105 U/L (ref 39–117)
BILIRUBIN DIRECT: 0.1 mg/dL (ref 0.0–0.3)
TOTAL PROTEIN: 8.4 g/dL — AB (ref 6.0–8.3)
Total Bilirubin: 0.5 mg/dL (ref 0.2–1.2)

## 2017-09-06 LAB — LIPID PANEL
CHOLESTEROL: 185 mg/dL (ref 0–200)
HDL: 42.4 mg/dL (ref >39.00)
LDL CALC: 121 mg/dL — AB (ref 0–99)
NonHDL: 142.21
Total CHOL/HDL Ratio: 4
Triglycerides: 108 mg/dL (ref 0.0–149.0)
VLDL: 21.6 mg/dL (ref 0.0–40.0)

## 2017-09-06 LAB — IBC PANEL
Iron: 33 ug/dL — ABNORMAL LOW (ref 42–145)
Saturation Ratios: 7.8 % — ABNORMAL LOW (ref 20.0–50.0)
Transferrin: 302 mg/dL (ref 212.0–360.0)

## 2017-09-06 MED ORDER — ZOSTER VAC RECOMB ADJUVANTED 50 MCG/0.5ML IM SUSR: 0.5000 mL | Freq: Once | INTRAMUSCULAR | 1 refills | Status: AC

## 2017-09-06 MED ORDER — KETOROLAC TROMETHAMINE 30 MG/ML IJ SOLN
30.0000 mg | Freq: Once | INTRAMUSCULAR | Status: AC
Start: 2017-09-06 — End: 2017-09-06
  Administered 2017-09-06: 30 mg via INTRAMUSCULAR

## 2017-09-06 NOTE — Assessment & Plan Note (Signed)
stable overall by history and exam, recent data reviewed with pt, and pt to continue medical treatment as before,  to f/u any worsening symptoms or concerns, for f/u lab today 

## 2017-09-06 NOTE — Patient Instructions (Signed)
You had the pain shot today (toradol)  Please continue all other medications as before, and refills have been done if requested.  Please have the pharmacy call with any other refills you may need.  Please continue your efforts at being more active, low cholesterol diet, and weight control.  Please keep your appointments with your specialists as you may have planned  Please go to the LAB in the Basement (turn left off the elevator) for the tests to be done today  You will be contacted by phone if any changes need to be made immediately.  Otherwise, you will receive a letter about your results with an explanation, but please check with MyChart first.  Please remember to sign up for MyChart if you have not done so, as this will be important to you in the future with finding out test results, communicating by private email, and scheduling acute appointments online when needed.  Please return in 6 months, or sooner if needed, with Lab testing done 3-5 days before

## 2017-09-06 NOTE — Assessment & Plan Note (Signed)
stable overall by history and exam, recent data reviewed with pt, and pt to continue medical treatment as before,  to f/u any worsening symptoms or concerns BP Readings from Last 3 Encounters:  09/06/17 104/62  07/19/17 106/70  07/17/17 110/68

## 2017-09-06 NOTE — Assessment & Plan Note (Signed)
With ABL s/p surgury with transfusion, for f/u lab today with iron

## 2017-09-06 NOTE — Progress Notes (Signed)
Subjective:    Patient ID: [name], female    DOB: 07-17-1945, 72 y.o.   MRN: [id number]  HPI Here to f/u; overall doing ok,  Pt denies chest pain, increasing sob or doe, wheezing, orthopnea, PND, increased LE swelling, palpitations, dizziness or syncope.  Pt denies new neurological symptoms such as new headache, or facial or extremity weakness or numbness.  Pt denies polydipsia, polyuria, or low sugar episode.  Pt states overall good compliance with meds, mostly trying to follow appropriate diet, with wt overall stable,  but little exercise however.  Pt continues to have recurring LBP without change in severity but mild to mod persistent s/p lumbar surgury, but no bowel or bladder change, fever, wt loss,  worsening LE pain/numbness/weakness, gait change or falls, but asks for toradol IM today for residual pain.  No overt bleeding Past Medical History:  Diagnosis Date  . AAA (abdominal aortic aneurysm) ([place])    a. s/p stent graft repair 01/2016.  [name] Allergic rhinitis, cause unspecified   . Anxiety state, unspecified   . Chronic LBP   . Coronary artery disease    a. inferior MI 1998 s/p PCI of RCA. b. stenting of Cx 04/2010. c. DES to prox LAD 05/2013. d. DES x 3 in 10/2015.  [name] Degeneration of lumbar or lumbosacral intervertebral disc   . Depressive disorder, not elsewhere classified   . Diabetes mellitus    TYPE II  . Gout 08/22/2013  . Hyperlipidemia   . Hypertension   . Hypothyroidism   . Lower GI bleed 06/2010   Diverticular bleed  . Noncompliance with medications 02/26/2014  . Obesity, unspecified    Past Surgical History:  Procedure Laterality Date  . CARDIAC CATHETERIZATION     PCI OF BOTH THE CIRCUMFLEX AND LEFT ANTERIOR DESCENDING ARTERY  . CARDIAC CATHETERIZATION N/A 10/29/2015   Procedure: Coronary Stent Intervention;  Surgeon: Nelva Bush, MD;  Location: [place] [facility];  Service: Cardiovascular;  Laterality: N/A;  . CARDIAC CATHETERIZATION N/A 10/29/2015   Procedure: Coronary/Graft Angiography;  Surgeon: Nelva Bush, MD;  Location: [place] [facility];  Service: Cardiovascular;  Laterality: N/A;  . CARDIAC CATHETERIZATION N/A 10/29/2015   Procedure: Intravascular Pressure Wire/FFR Study;  Surgeon: Nelva Bush, MD;  Location: [place] [facility];  Service: Cardiovascular;  Laterality: N/A;  . CESAREAN SECTION    . ENDOVASCULAR STENT INSERTION N/A 01/22/2016   Procedure: ABDOMINAL AORTIC ENDOVASCULAR STENT GRAFT INSERTION;  Surgeon: Rosetta Posner, MD;  Location: [place];  Service: Vascular;  Laterality: N/A;  . HEART STENT  04-2010  and  Jun 07, 2013   X 3  . LEFT HEART CATHETERIZATION WITH CORONARY ANGIOGRAM N/A 06/07/2013   Procedure: LEFT HEART CATHETERIZATION WITH CORONARY ANGIOGRAM;  Surgeon: [name], MD;  Location: [facility] CATH LAB;  Service: Cardiovascular;  Laterality: N/A;  . LEFT HEART CATHETERIZATION WITH CORONARY ANGIOGRAM N/A 02/25/2014   Procedure: LEFT HEART CATHETERIZATION WITH CORONARY ANGIOGRAM;  Surgeon: Troy Sine, MD;  Location: [facility] C[facility] LAB;  Service: Cardiovascular;  Laterality: N/A;  . LUMBAR FUSION  01/2007   DR. Patrice Paradise...3-LEVEL WITH FIXATION  . OOPHORECTOMY     BSO? pt.unsure  . PARATHYROIDECTOMY    . SPINE SURGERY    . THYROIDECTOMY    . TOTAL ABDOMINAL HYSTERECTOMY      reports that she quit smoking about 31 years ago. Her smoking use included cigarettes. She quit after 30.00 years of use. She has never used smokeless tobacco. She reports  that she does not drink alcohol or use drugs. family history includes Arthritis in her maternal aunt; Breast cancer in her maternal aunt; Diabetes in her brother and maternal aunt; Heart attack (age of onset: 66) in her mother; Heart attack (age of onset: 58) in her father; Heart disease in her father and mother. Allergies  Allergen Reactions  . Prilosec [Omeprazole] Other (See Comments)    Chest pain  . Miconazole Nitrate Hives    REACTION: hives  . Crestor  [Rosuvastatin Calcium] Other (See Comments)    Feeling poor  . Augmentin [Amoxicillin-Pot Clavulanate] Hives, Itching and Rash    Has patient had a PCN reaction causing immediate rash, facial/tongue/throat swelling, SOB or lightheadedness with hypotension:unsure Has patient had a PCN reaction causing severe rash involving mucus membranes or skin necrosis:unsure Has patient had a PCN reaction that required hospitalization:No Has patient had a PCN reaction occurring within the last 10 years:NO If all of the above answers are "NO", then may proceed with Cephalosporin use. Has patient had a PCN reaction causing immediate rash, facial/tongue/throat swelling, SOB or lightheadedness with hypotension:unsure Has patient had a PCN reaction causing severe rash involving mucus membranes or skin necrosis:unsure Has patient had a PCN reaction that required hospitalization:No Has patient had a PCN reaction occurring within the last 10 years:NO If all of the above answers are "NO", then may proceed with Cephalosporin use.   [name] Doxycycline Other (See Comments)    REACTION: gi upset   Current Outpatient Medications on File Prior to Visit  Medication Sig Dispense Refill  . allopurinol (ZYLOPRIM) 300 MG tablet Take 1 tablet (300 mg total) by mouth daily. 90 tablet 3  . aspirin 81 MG EC tablet Take 81 mg by mouth daily.     [name] atenolol (TENORMIN) 25 MG tablet TAKE 1 TABLET(25 MG) BY MOUTH TWICE DAILY (Patient taking differently: TAKE 1 TABLET(25 MG) BY MOUTH EVERY DAY) 180 tablet 0  . clopidogrel (PLAVIX) 75 MG tablet Take 1 tablet (75 mg total) by mouth every evening. (Patient taking differently: Take 75 mg by mouth daily. ) 90 tablet 3  . furosemide (LASIX) 20 MG tablet Take 1 tablet (20 mg total) by mouth daily as needed. 30 tablet 2  . gabapentin (NEURONTIN) 300 MG capsule 1-2 tab by mouth at bedtime 180 capsule 1  . lactulose (CHRONULAC) 10 GM/15ML solution Take 45 mLs (30 g total) by mouth 2 (two) times  daily as needed for mild constipation. 946 mL 2  . loratadine (CLARITIN) 10 MG tablet Take 10 mg by mouth daily.    . metFORMIN (GLUCOPHAGE-XR) 500 MG 24 hr tablet TAKE 4 TABLETS BY MOUTH EVERY MORNING (Patient taking differently: Take 500 mg by mouth daily with breakfast. ) 360 tablet 3  . methocarbamol (ROBAXIN) 500 MG tablet Take 1 tablet (500 mg total) by mouth 3 (three) times daily. 9 tablet 0  . morphine (MS CONTIN) 15 MG 12 hr tablet Take 1 tablet (15 mg total) by mouth at bedtime. For 7 days ten stop this medication. STOP: 06/16/17. Additional order: 15 mg daily at bedtime for 7 days Start: 06/17/17 Stop: 06/23/17 3 tablet 0  . nitroGLYCERIN (NITROSTAT) 0.4 MG SL tablet Place 1 tablet (0.4 mg total) under the tongue every 5 (five) minutes as needed for chest pain. 25 tablet 3  . pantoprazole (PROTONIX) 40 MG tablet Take 40 mg by mouth daily.    [name] PLAVIX 75 MG tablet TAKE 1 TABLET BY MOUTH EVERY DAY 90 tablet 3  .  SYNTHROID 150 MCG tablet TAKE 1 TABLET(150 MCG) BY MOUTH DAILY 90 tablet 0  . vitamin B-12 1000 MCG tablet Take 1 tablet (1,000 mcg total) by mouth daily.    . Vitamin D, Ergocalciferol, (DRISDOL) 50000 units CAPS capsule Take 1 capsule (50,000 Units total) by mouth every 7 (seven) days. 12 capsule 0   No current facility-administered medications on file prior to visit.    Review of Systems  Constitutional: Negative for other unusual diaphoresis or sweats HENT: Negative for ear discharge or swelling Eyes: Negative for other worsening visual disturbances Respiratory: Negative for stridor or other swelling  Gastrointestinal: Negative for worsening distension or other blood Genitourinary: Negative for retention or other urinary change Musculoskeletal: Negative for other MSK pain or swelling Skin: Negative for color change or other new lesions Neurological: Negative for worsening tremors and other numbness  Psychiatric/Behavioral: Negative for worsening agitation or other fatigue All  other system neg per pt    Objective:   Physical Exam BP 104/62   Pulse 80   Temp 97.9 F (36.6 C) (Oral)   Ht 5\' 5"  (1.651 m)   Wt 213 lb (96.6 kg)   SpO2 96%   BMI 35.45 kg/m  VS noted, obese, not ill appearing Constitutional: Pt appears in NAD HENT: Head: NCAT.  Right Ear: External ear normal.  Left Ear: External ear normal.  Eyes: . Pupils are equal, round, and reactive to light. Conjunctivae and EOM are normal Nose: without d/c or deformity Neck: Neck supple. Gross normal ROM Cardiovascular: Normal rate and regular rhythm.   Pulmonary/Chest: Effort normal and breath sounds without rales or wheezing.  Abd:  Soft, NT, ND, + BS, no organomegaly Spine nontender in the midline or paravertebral Neurological: Pt is alert. At baseline orientation, motor grossly intact Skin: Skin is warm. No rashes, other new lesions, no LE edema Psychiatric: Pt behavior is normal without agitation , mild nervous No other exam findings  Lab Results  Component Value Date   WBC 7.8 07/17/2017   HGB 11.0 (L) 07/17/2017   HCT 36.7 07/17/2017   PLT 433 07/17/2017   GLUCOSE 108 (H) 06/26/2017   CHOL 138 03/09/2017   TRIG 131.0 03/09/2017   HDL 28.30 (L) 03/09/2017   LDLDIRECT 131.0 03/27/2015   LDLCALC 84 03/09/2017   ALT 20 06/12/2017   AST 23 06/12/2017   NA 143 06/26/2017   K 4.2 06/26/2017   CL 106 06/26/2017   CREATININE 0.92 06/26/2017   BUN 9 06/26/2017   CO2 28 06/26/2017   TSH 0.51 03/09/2017   INR 1.08 01/22/2016   HGBA1C 5.9 03/09/2017   MICROALBUR 11.8 (H) 03/09/2017      Assessment & Plan:

## 2017-09-12 ENCOUNTER — Telehealth: Payer: Self-pay | Admitting: *Deleted

## 2017-09-12 NOTE — Telephone Encounter (Signed)
Received fax today in regards to Plavix if provider wanted to send in a generic Plavix. I called the pharmacy and s/w Pharm-D and advised we have noted on the Plavix per the pt she request Brand Name and NOT generic. Pharm-D states she will make a note of this.

## 2017-09-27 DIAGNOSIS — D509 Iron deficiency anemia, unspecified: Secondary | ICD-10-CM

## 2017-09-27 DIAGNOSIS — Z6835 Body mass index (BMI) 35.0-35.9, adult: Secondary | ICD-10-CM

## 2017-09-27 DIAGNOSIS — F329 Major depressive disorder, single episode, unspecified: Secondary | ICD-10-CM

## 2017-09-27 DIAGNOSIS — I2510 Atherosclerotic heart disease of native coronary artery without angina pectoris: Secondary | ICD-10-CM

## 2017-09-27 DIAGNOSIS — Z7982 Long term (current) use of aspirin: Secondary | ICD-10-CM

## 2017-09-27 DIAGNOSIS — M5106 Intervertebral disc disorders with myelopathy, lumbar region: Secondary | ICD-10-CM

## 2017-09-27 DIAGNOSIS — E785 Hyperlipidemia, unspecified: Secondary | ICD-10-CM

## 2017-09-27 DIAGNOSIS — Z7902 Long term (current) use of antithrombotics/antiplatelets: Secondary | ICD-10-CM

## 2017-09-27 DIAGNOSIS — E669 Obesity, unspecified: Secondary | ICD-10-CM

## 2017-09-27 DIAGNOSIS — M1000 Idiopathic gout, unspecified site: Secondary | ICD-10-CM

## 2017-09-27 DIAGNOSIS — F419 Anxiety disorder, unspecified: Secondary | ICD-10-CM

## 2017-09-27 DIAGNOSIS — Z7984 Long term (current) use of oral hypoglycemic drugs: Secondary | ICD-10-CM

## 2017-09-27 DIAGNOSIS — K219 Gastro-esophageal reflux disease without esophagitis: Secondary | ICD-10-CM

## 2017-09-27 DIAGNOSIS — E1140 Type 2 diabetes mellitus with diabetic neuropathy, unspecified: Secondary | ICD-10-CM

## 2017-09-27 DIAGNOSIS — I10 Essential (primary) hypertension: Secondary | ICD-10-CM

## 2017-09-27 DIAGNOSIS — Z4789 Encounter for other orthopedic aftercare: Secondary | ICD-10-CM

## 2017-09-27 DIAGNOSIS — E039 Hypothyroidism, unspecified: Secondary | ICD-10-CM

## 2017-10-19 ENCOUNTER — Ambulatory Visit: Payer: Self-pay

## 2017-10-19 ENCOUNTER — Encounter: Payer: Self-pay | Admitting: Nurse Practitioner

## 2017-10-19 ENCOUNTER — Ambulatory Visit (INDEPENDENT_AMBULATORY_CARE_PROVIDER_SITE_OTHER): Payer: Medicare Other | Admitting: Nurse Practitioner

## 2017-10-19 ENCOUNTER — Other Ambulatory Visit (INDEPENDENT_AMBULATORY_CARE_PROVIDER_SITE_OTHER): Payer: Medicare Other

## 2017-10-19 VITALS — BP 118/78 | HR 76 | Ht 65.0 in | Wt 225.0 lb

## 2017-10-19 DIAGNOSIS — Z23 Encounter for immunization: Secondary | ICD-10-CM | POA: Diagnosis not present

## 2017-10-19 DIAGNOSIS — R609 Edema, unspecified: Secondary | ICD-10-CM

## 2017-10-19 LAB — BRAIN NATRIURETIC PEPTIDE: Pro B Natriuretic peptide (BNP): 36 pg/mL (ref 0.0–100.0)

## 2017-10-19 NOTE — Patient Instructions (Signed)
Please head downstairs for lab work. If any of your test results are critically abnormal, you will be contacted right away. Otherwise, I will contact you as soon as possible with any changes or recommendations.  I have placed an order for ultrasound of your right leg. Our office will begin processing this referral. Please follow up if you have not heard anything about this referral within 10 days.   Edema Edema is when you have too much fluid in your body or under your skin. Edema may make your legs, feet, and ankles swell up. Swelling is also common in looser tissues, like around your eyes. This is a common condition. It gets more common as you get older. There are many possible causes of edema. Eating too much salt (sodium) and being on your feet or sitting for a long time can cause edema in your legs, feet, and ankles. Hot weather may make edema worse. Edema is usually painless. Your skin may look swollen or shiny. Follow these instructions at home:  Keep the swollen body part raised (elevated) above the level of your heart when you are sitting or lying down.  Do not sit still or stand for a long time.  Do not wear tight clothes. Do not wear garters on your upper legs.  Exercise your legs. This can help the swelling go down.  Wear elastic bandages or support stockings as told by your doctor.  Eat a low-salt (low-sodium) diet to reduce fluid as told by your doctor.  Depending on the cause of your swelling, you may need to limit how much fluid you drink (fluid restriction).  Take over-the-counter and prescription medicines only as told by your doctor. Contact a doctor if:  Treatment is not working.  You have heart, liver, or kidney disease and have symptoms of edema.  You have sudden and unexplained weight gain. Get help right away if:  You have shortness of breath or chest pain.  You cannot breathe when you lie down.  You have pain, redness, or warmth in the swollen  areas.  You have heart, liver, or kidney disease and get edema all of a sudden.  You have a fever and your symptoms get worse all of a sudden. Summary  Edema is when you have too much fluid in your body or under your skin.  Edema may make your legs, feet, and ankles swell up. Swelling is also common in looser tissues, like around your eyes.  Raise (elevate) the swollen body part above the level of your heart when you are sitting or lying down.  Follow your doctor's instructions about diet and how much fluid you can drink (fluid restriction). This information is not intended to replace advice given to you by your health care provider. Make sure you discuss any questions you have with your health care provider. Document Released: 06/22/2007 Document Revised: 01/22/2016 Document Reviewed: 01/22/2016 Elsevier Interactive Patient Education  2017 [employer].

## 2017-10-19 NOTE — Telephone Encounter (Signed)
Noted  

## 2017-10-19 NOTE — Progress Notes (Signed)
[name] is a 72 y.o. female with the following history as recorded in EpicCare:  Patient Active Problem List   Diagnosis Date Noted  . Pain and swelling of right lower extremity 07/19/2017  . B12 deficiency 07/19/2017  . Bilateral hearing loss 07/19/2017  . Iron deficiency anemia 06/13/2017  . Coronary artery disease   . Bilateral swelling of feet 05/29/2017  . Pharyngitis 01/11/2017  . Vaginal yeast infection 07/05/2016  . Lower abdominal pain 07/05/2016  . Chronic low back pain 06/28/2016  . Insomnia 06/28/2016  . History of endovascular stent graft for abdominal aortic aneurysm (AAA) 01/22/2016  . Lumbar radiculopathy 05/15/2015  . Acute sinus infection 05/14/2015  . Arthritis of left hip 04/16/2015  . Acute gout 01/16/2015  . Acute upper respiratory infection 06/09/2014  . Angioedema of lips 06/03/2014  . Frequent urination 05/08/2014  . Acute epiglottitis without obstruction 05/05/2014  . Angioedema   . Neck swelling   . Abdominal tenderness 03/26/2014  . Noncompliance with medications 02/26/2014  . AAA (abdominal aortic aneurysm) ([place])   . Chest pain 02/24/2014  . Jaw pain 09/25/2013  . Myalgia 09/25/2013  . Gout 08/22/2013  . Abdominal pain, unspecified site 08/13/2013  . Musculoskeletal pain 08/04/2013  . Aftercare following surgery of the circulatory system, [place] 06/25/2013  . Peripheral vascular disease, unspecified ([place]) 06/19/2012  . Dry cough 03/05/2012  . Dysuria 04/21/2011  . Low back pain 10/04/2010  . Acute ischemic colitis ([place]) 07/29/2010  . Lower GI bleed 07/23/2010  . Liver lesion 07/23/2010  . Encounter for well adult exam with abnormal findings 07/22/2010  . URTICARIA 09/23/2009  . TOBACCO ABUSE 11/18/2008  . Obesity 11/17/2008  . MENOPAUSAL DISORDER 10/29/2007  . ANXIETY 06/22/2007  . DEPRESSION 06/22/2007  . OTITIS MEDIA, SEROUS, CHRONIC 04/23/2007  . Constipation 02/07/2007  . [place] DISEASE, LUMBOSACRAL SPINE 02/07/2007  . Diabetes  ([place]) 11/03/2006  . Allergic rhinitis 11/03/2006  . Hypothyroidism 07/31/2006  . Hyperlipidemia 07/31/2006  . Essential hypertension 07/31/2006  . Coronary artery disease involving native heart without angina pectoris 07/31/2006    Current Outpatient Medications  Medication Sig Dispense Refill  . allopurinol (ZYLOPRIM) 300 MG tablet Take 1 tablet (300 mg total) by mouth daily. 90 tablet 3  . aspirin 81 MG EC tablet Take 81 mg by mouth daily.     [name] atenolol (TENORMIN) 25 MG tablet TAKE 1 TABLET(25 MG) BY MOUTH TWICE DAILY (Patient taking differently: TAKE 1 TABLET(25 MG) BY MOUTH EVERY DAY) 180 tablet 0  . celecoxib (CELEBREX) 200 MG capsule as needed.    . clopidogrel (PLAVIX) 75 MG tablet Take 1 tablet (75 mg total) by mouth every evening. (Patient taking differently: Take 75 mg by mouth daily. ) 90 tablet 3  . furosemide (LASIX) 20 MG tablet Take 1 tablet (20 mg total) by mouth daily as needed. 30 tablet 2  . gabapentin (NEURONTIN) 300 MG capsule 1-2 tab by mouth at bedtime 180 capsule 1  . lactulose (CHRONULAC) 10 GM/15ML solution Take 45 mLs (30 g total) by mouth 2 (two) times daily as needed for mild constipation. 946 mL 2  . loratadine (CLARITIN) 10 MG tablet Take 10 mg by mouth daily.    . metFORMIN (GLUCOPHAGE-XR) 500 MG 24 hr tablet TAKE 4 TABLETS BY MOUTH EVERY MORNING (Patient taking differently: Take 500 mg by mouth daily with breakfast. ) 360 tablet 3  . methocarbamol (ROBAXIN) 500 MG tablet Take 1 tablet (500 mg total) by mouth 3 (three) times daily.  9 tablet 0  . morphine (MS CONTIN) 15 MG 12 hr tablet Take 1 tablet (15 mg total) by mouth at bedtime. For 7 days ten stop this medication. STOP: 06/16/17. Additional order: 15 mg daily at bedtime for 7 days Start: 06/17/17 Stop: 06/23/17 3 tablet 0  . nitroGLYCERIN (NITROSTAT) 0.4 MG SL tablet Place 1 tablet (0.4 mg total) under the tongue every 5 (five) minutes as needed for chest pain. 25 tablet 3  . oxyCODONE (OXY IR/ROXICODONE) 5 MG  immediate release tablet 4 (four) times daily as needed.    . pantoprazole (PROTONIX) 40 MG tablet Take 40 mg by mouth daily.    [name] PLAVIX 75 MG tablet TAKE 1 TABLET BY MOUTH EVERY DAY 90 tablet 3  . SYNTHROID 150 MCG tablet TAKE 1 TABLET(150 MCG) BY MOUTH DAILY 90 tablet 0  . vitamin B-12 1000 MCG tablet Take 1 tablet (1,000 mcg total) by mouth daily.    . Vitamin D, Ergocalciferol, (DRISDOL) 50000 units CAPS capsule Take 1 capsule (50,000 Units total) by mouth every 7 (seven) days. 12 capsule 0   No current facility-administered medications for this visit.     Allergies: Prilosec [omeprazole]; Miconazole nitrate; Crestor [rosuvastatin calcium]; Augmentin [amoxicillin-pot clavulanate]; and Doxycycline  Past Medical History:  Diagnosis Date  . AAA (abdominal aortic aneurysm) ([place])    a. s/p stent graft repair 01/2016.  [name] Allergic rhinitis, cause unspecified   . Anxiety state, unspecified   . Chronic LBP   . Coronary artery disease    a. inferior MI 1998 s/p PCI of RCA. b. stenting of Cx 04/2010. c. DES to prox LAD 05/2013. d. DES x 3 in 10/2015.  [name] Degeneration of lumbar or lumbosacral intervertebral disc   . Depressive disorder, not elsewhere classified   . Diabetes mellitus    TYPE II  . Gout 08/22/2013  . Hyperlipidemia   . Hypertension   . Hypothyroidism   . Lower GI bleed 06/2010   Diverticular bleed  . Noncompliance with medications 02/26/2014  . Obesity, unspecified     Past Surgical History:  Procedure Laterality Date  . CARDIAC CATHETERIZATION     PCI OF BOTH THE CIRCUMFLEX AND LEFT ANTERIOR DESCENDING ARTERY  . CARDIAC CATHETERIZATION N/A 10/29/2015   Procedure: Coronary Stent Intervention;  Surgeon: Nelva Bush, MD;  Location: [facility];  Service: Cardiovascular;  Laterality: N/A;  . CARDIAC CATHETERIZATION N/A 10/29/2015   Procedure: Coronary/Graft Angiography;  Surgeon: Nelva Bush, MD;  Location: [facility];  Service: Cardiovascular;  Laterality:  N/A;  . CARDIAC CATHETERIZATION N/A 10/29/2015   Procedure: Intravascular Pressure Wire/FFR Study;  Surgeon: Nelva Bush, MD;  Location: [facility];  Service: Cardiovascular;  Laterality: N/A;  . CESAREAN SECTION    . ENDOVASCULAR STENT INSERTION N/A 01/22/2016   Procedure: ABDOMINAL AORTIC ENDOVASCULAR STENT GRAFT INSERTION;  Surgeon: Rosetta Posner, MD;  Location: [facility];  Service: Vascular;  Laterality: N/A;  . HEART STENT  04-2010  and  Jun 07, 2013   X 3  . LEFT HEART CATHETERIZATION WITH CORONARY ANGIOGRAM N/A 06/07/2013   Procedure: LEFT HEART CATHETERIZATION WITH CORONARY ANGIOGRAM;  Surgeon: [name], MD;  Location: [facility];  Service: Cardiovascular;  Laterality: N/A;  . LEFT HEART CATHETERIZATION WITH CORONARY ANGIOGRAM N/A 02/25/2014   Procedure: LEFT HEART CATHETERIZATION WITH CORONARY ANGIOGRAM;  Surgeon: Troy Sine, MD;  Location: [facility] C[facility] LAB;  Service: Cardiovascular;  Laterality: N/A;  . LUMBAR FUSION  01/2007  DR. Patrice Paradise...3-LEVEL WITH FIXATION  . OOPHORECTOMY     BSO? pt.unsure  . PARATHYROIDECTOMY    . SPINE SURGERY    . THYROIDECTOMY    . TOTAL ABDOMINAL HYSTERECTOMY      Family History  Problem Relation Age of Onset  . Heart attack Mother 73       s/p D&C-CARDIAC ARREST 1966  . Heart disease Mother   . Heart attack Father 36       1978 WITH MI  . Heart disease Father   . Diabetes Brother   . Diabetes Maternal Aunt   . Arthritis Maternal Aunt   . Breast cancer Maternal Aunt        Post menopausal  . Colon cancer Neg Hx     Social History   Tobacco Use  . Smoking status: Former Smoker    Years: 30.00    Types: Cigarettes    Last attempt to quit: 05/31/1986    Years since quitting: 31.4  . Smokeless tobacco: Never Used  Substance Use Topics  . Alcohol use: No     Subjective:  Ms Duran is here today for evaluation of an acute complaint of RLE swelling, worsening over past 1 week. She was seen by her PCP for swelling to her legs  this summer, says the swelling got better but has returned to RLE from calf to foot this week, and is much more swollen than the first time. She has a numb feeling on the bottom of her right foot. She denies fevers, chills, chest pain, shortness of breath, erythema, bruising, injury. There is no pain. She denies any history of blood clots. She did have back surgery in June. She is on lasix, plavix by cardiology. Has tried elevation at home with no relief.  Wt Readings from Last 3 Encounters:  10/19/17 225 lb (102.1 kg)  09/06/17 213 lb (96.6 kg)  07/19/17 211 lb (95.7 kg)    ROS: See HPI  Objective:  Vitals:   10/19/17 1528  BP: 118/78  Pulse: 76  SpO2: 96%  Weight: 225 lb (102.1 kg)  Height: 5\' 5"  (1.651 m)    General: Well developed, well nourished, in no acute distress  Skin : Warm and dry.  Head: Normocephalic and atraumatic  Eyes: Sclera and conjunctiva clear; pupils round and reactive to light; extraocular movements intact  Neck: Supple. Lungs: Respirations unlabored; clear to auscultation bilaterally without wheeze, rales, rhonchi  CVS exam: normal rate, regular rhythm, normal S1, S2, distal pulses intact. 2+ edema to RLE-ankle, foot Musculoskeletal: No deformities Neurologic: Alert and oriented; speech intact; face symmetrical; moves all extremities well; CNII-XII intact without focal deficit   Physical Exam   Assessment:  1. Edema, unspecified type   2. Need for influenza vaccination     Plan:   Hm: Need for influenza vaccination - Flu vaccine HIGH DOSE PF  Edema, unspecified type DVT study 07/19/17 was normal, will repeat study for return of symptoms to r/o DVT, blockage F/U with further recommendations pending test results Home management, red flags and return precautions including when to seek emergency care discussed and printed on AVS - B Nat Peptide; Future - VAS [place] LOWER EXTREMITY VENOUS (DVT); Future   No follow-ups on file.  Orders Placed This  Encounter  Procedures  . Flu vaccine HIGH DOSE PF  . B Nat Peptide    Standing Status:   Future    Standing Expiration Date:   10/19/2018    Requested Prescriptions  No prescriptions requested or ordered in this encounter

## 2017-10-19 NOTE — Telephone Encounter (Signed)
Pt. Reports swelling to right leg x 1 week. Swollen from the knee down. Denies pain or redness to leg. Can only get a slipper on her foot. Had this happen in July - "but I don't know what was wrong with it." Denies any chest pain or shortness of breath.Appointment made for today. Instructed pt. If symptoms worsen, go to ED.  Reason for Disposition . [1] MODERATE leg swelling (e.g., swelling extends up to knees) AND [2] new onset or worsening  Answer Assessment - Initial Assessment Questions 1. ONSET: "When did the swelling start?" (e.g., minutes, hours, days)     1 week ago 2. LOCATION: "What part of the leg is swollen?"  "Are both legs swollen or just one leg?"     From the knee down -just the right leg 3. SEVERITY: "How bad is the swelling?" (e.g., localized; mild, moderate, severe)  - Localized - small area of swelling localized to one leg  - MILD pedal edema - swelling limited to foot and ankle, pitting edema < 1/4 inch (6 mm) deep, rest and elevation eliminate most or all swelling  - MODERATE edema - swelling of lower leg to knee, pitting edema > 1/4 inch (6 mm) deep, rest and elevation only partially reduce swelling  - SEVERE edema - swelling extends above knee, facial or hand swelling present      Moderate 4. REDNESS: "Does the swelling look red or infected?"     No 5. PAIN: "Is the swelling painful to touch?" If so, ask: "How painful is it?"   (Scale 1-10; mild, moderate or severe)     No 6. FEVER: "Do you have a fever?" If so, ask: "What is it, how was it measured, and when did it start?"      No 7. CAUSE: "What do you think is causing the leg swelling?"     Unsure 8. MEDICAL HISTORY: "Do you have a history of heart failure, kidney disease, liver failure, or cancer?"     No 9. RECURRENT SYMPTOM: "Have you had leg swelling before?" If so, ask: "When was the last time?" "What happened that time?"     Yes 10. OTHER SYMPTOMS: "Do you have any other symptoms?" (e.g., chest pain,  difficulty breathing)       No 11. PREGNANCY: "Is there any chance you are pregnant?" "When was your last menstrual period?"       No  Protocols used: LEG SWELLING AND EDEMA-A-AH

## 2017-10-20 ENCOUNTER — Ambulatory Visit (HOSPITAL_COMMUNITY)
Admission: RE | Admit: 2017-10-20 | Discharge: 2017-10-20 | Disposition: A | Discharge status: Home / Self Care | Payer: Medicare Other | Source: Ambulatory Visit | Attending: Cardiology | Admitting: Cardiology

## 2017-10-20 ENCOUNTER — Encounter (HOSPITAL_COMMUNITY): Payer: Self-pay

## 2017-10-20 DIAGNOSIS — R609 Edema, unspecified: Secondary | ICD-10-CM

## 2017-10-24 ENCOUNTER — Telehealth: Payer: Self-pay | Admitting: Internal Medicine

## 2017-10-24 NOTE — Telephone Encounter (Signed)
Copied from [place] [phone]. Topic: Quick Communication - See Telephone Encounter >> Oct 24, 2017 10:48 AM Valla Leaver wrote: CRM for notification. See Telephone encounter for: 10/24/17. Patient calling to find out what to do next regarding right leg swelling. It has not improved. Ultrasound was completed that [facility] ordered. Patient is very concerned. Please contact her to update.

## 2017-10-24 NOTE — Telephone Encounter (Signed)
See 10/20/17 Vas [place] LE results. Pt informed of below.  OV scheduled with PCP for 10/25/17 @ 11:00 am. Pt informed   Notes recorded by Lance Sell, NP on 10/23/2017 at 1:27 PM EDT Doppler study was normal. I don't have anything else to offer regarding her leg swelling, we can either have her follow up with vein and vascular or see Dr [name] back for follow up. If she wants referral let me know.

## 2017-10-25 ENCOUNTER — Ambulatory Visit (INDEPENDENT_AMBULATORY_CARE_PROVIDER_SITE_OTHER): Payer: Medicare Other | Admitting: Internal Medicine

## 2017-10-25 ENCOUNTER — Ambulatory Visit (INDEPENDENT_AMBULATORY_CARE_PROVIDER_SITE_OTHER): Payer: Medicare Other | Admitting: Cardiology

## 2017-10-25 ENCOUNTER — Encounter: Payer: Self-pay | Admitting: Cardiology

## 2017-10-25 ENCOUNTER — Encounter: Payer: Self-pay | Admitting: Internal Medicine

## 2017-10-25 VITALS — BP 116/72 | HR 87 | Temp 97.6°F | Ht 65.0 in | Wt 220.0 lb

## 2017-10-25 VITALS — BP 122/72 | HR 103 | Ht 65.0 in | Wt 222.0 lb

## 2017-10-25 DIAGNOSIS — Z95828 Presence of other vascular implants and grafts: Secondary | ICD-10-CM

## 2017-10-25 DIAGNOSIS — E782 Mixed hyperlipidemia: Secondary | ICD-10-CM | POA: Diagnosis not present

## 2017-10-25 DIAGNOSIS — I2510 Atherosclerotic heart disease of native coronary artery without angina pectoris: Secondary | ICD-10-CM

## 2017-10-25 DIAGNOSIS — I10 Essential (primary) hypertension: Secondary | ICD-10-CM

## 2017-10-25 DIAGNOSIS — R609 Edema, unspecified: Secondary | ICD-10-CM

## 2017-10-25 DIAGNOSIS — G4700 Insomnia, unspecified: Secondary | ICD-10-CM

## 2017-10-25 MED ORDER — HYDROCHLOROTHIAZIDE 12.5 MG PO CAPS: 12.5000 mg | ORAL_CAPSULE | Freq: Every day | ORAL | 3 refills | Status: DC

## 2017-10-25 MED ORDER — ZOLPIDEM TARTRATE 5 MG PO TABS: 5.0000 mg | ORAL_TABLET | Freq: Every evening | ORAL | 5 refills | Status: DC | PRN

## 2017-10-25 NOTE — Progress Notes (Signed)
Subjective:    Patient ID: [name], female    DOB: 1945/10/03, 72 y.o.   MRN: [id number]  HPI  Here having stopped her plavix x 1 wk after a sensation of "blowing up" on this; now with over 1 wk onset RLE swelling without rash, erythema, or worsening pain, venous doppler neg for DVT oct 4; swelling better overnight, then back at about noon with being up during the day.  Pt denies chest pain, increased sob or doe, wheezing, orthopnea, PND, palpitations, dizziness or syncope.  Pt denies new neurological symptoms such as new headache, or facial or extremity weakness or numbness   Pt denies polydipsia, polyuria.  Also with difficulty getting to sleep most nights for several months, ? Stress, Denies worsening depressive symptoms, suicidal ideation, or panic Past Medical History:  Diagnosis Date  . AAA (abdominal aortic aneurysm) ([place])    a. s/p stent graft repair 01/2016.  [name] Allergic rhinitis, cause unspecified   . Anxiety state, unspecified   . Chronic LBP   . Coronary artery disease    a. inferior MI 1998 s/p PCI of RCA. b. stenting of Cx 04/2010. c. DES to prox LAD 05/2013. d. DES x 3 in 10/2015.  [name] Degeneration of lumbar or lumbosacral intervertebral disc   . Depressive disorder, not elsewhere classified   . Diabetes mellitus    TYPE II  . Gout 08/22/2013  . Hyperlipidemia   . Hypertension   . Hypothyroidism   . Lower GI bleed 06/2010   Diverticular bleed  . Noncompliance with medications 02/26/2014  . Obesity, unspecified    Past Surgical History:  Procedure Laterality Date  . CARDIAC CATHETERIZATION     PCI OF BOTH THE CIRCUMFLEX AND LEFT ANTERIOR DESCENDING ARTERY  . CARDIAC CATHETERIZATION N/A 10/29/2015   Procedure: Coronary Stent Intervention;  Surgeon: Nelva Bush, MD;  Location: [facility];  Service: Cardiovascular;  Laterality: N/A;  . CARDIAC CATHETERIZATION N/A 10/29/2015   Procedure: Coronary/Graft Angiography;  Surgeon: Nelva Bush, MD;  Location: [facility];  Service: Cardiovascular;  Laterality: N/A;  . CARDIAC CATHETERIZATION N/A 10/29/2015   Procedure: Intravascular Pressure Wire/FFR Study;  Surgeon: Nelva Bush, MD;  Location: [facility];  Service: Cardiovascular;  Laterality: N/A;  . CESAREAN SECTION    . ENDOVASCULAR STENT INSERTION N/A 01/22/2016   Procedure: ABDOMINAL AORTIC ENDOVASCULAR STENT GRAFT INSERTION;  Surgeon: Rosetta Posner, MD;  Location: [facility];  Service: Vascular;  Laterality: N/A;  . HEART STENT  04-2010  and  Jun 07, 2013   X 3  . LEFT HEART CATHETERIZATION WITH CORONARY ANGIOGRAM N/A 06/07/2013   Procedure: LEFT HEART CATHETERIZATION WITH CORONARY ANGIOGRAM;  Surgeon: [name], MD;  Location: [facility];  Service: Cardiovascular;  Laterality: N/A;  . LEFT HEART CATHETERIZATION WITH CORONARY ANGIOGRAM N/A 02/25/2014   Procedure: LEFT HEART CATHETERIZATION WITH CORONARY ANGIOGRAM;  Surgeon: Troy Sine, MD;  Location: [facility] CATH LAB;  Service: Cardiovascular;  Laterality: N/A;  . LUMBAR FUSION  01/2007   DR. Patrice Paradise...3-LEVEL WITH FIXATION  . OOPHORECTOMY     BSO? pt.unsure  . PARATHYROIDECTOMY    . SPINE SURGERY    . THYROIDECTOMY    . TOTAL ABDOMINAL HYSTERECTOMY      reports that she quit smoking about 31 years ago. Her smoking use included cigarettes. She quit after 30.00 years of use. She has never used smokeless tobacco. She reports that she does not drink alcohol or use drugs.  family history includes Arthritis in her maternal aunt; Breast cancer in her maternal aunt; Diabetes in her brother and maternal aunt; Heart attack (age of onset: 45) in her mother; Heart attack (age of onset: 33) in her father; Heart disease in her father and mother. Allergies  Allergen Reactions  . Prilosec [Omeprazole] Other (See Comments)    Chest pain  . Miconazole Nitrate Hives    REACTION: hives  . Crestor [Rosuvastatin Calcium] Other (See Comments)    Feeling poor  . Augmentin [Amoxicillin-Pot  Clavulanate] Hives, Itching and Rash    Has patient had a PCN reaction causing immediate rash, facial/tongue/throat swelling, SOB or lightheadedness with hypotension:unsure Has patient had a PCN reaction causing severe rash involving mucus membranes or skin necrosis:unsure Has patient had a PCN reaction that required hospitalization:No Has patient had a PCN reaction occurring within the last 10 years:NO If all of the above answers are "NO", then may proceed with Cephalosporin use. Has patient had a PCN reaction causing immediate rash, facial/tongue/throat swelling, SOB or lightheadedness with hypotension:unsure Has patient had a PCN reaction causing severe rash involving mucus membranes or skin necrosis:unsure Has patient had a PCN reaction that required hospitalization:No Has patient had a PCN reaction occurring within the last 10 years:NO If all of the above answers are "NO", then may proceed with Cephalosporin use.   [name] Doxycycline Other (See Comments)    REACTION: gi upset   Current Outpatient Medications on File Prior to Visit  Medication Sig Dispense Refill  . allopurinol (ZYLOPRIM) 300 MG tablet Take 1 tablet (300 mg total) by mouth daily. 90 tablet 3  . aspirin 81 MG EC tablet Take 81 mg by mouth daily.     [name] atenolol (TENORMIN) 25 MG tablet TAKE 1 TABLET(25 MG) BY MOUTH TWICE DAILY (Patient taking differently: TAKE 1 TABLET(25 MG) BY MOUTH EVERY DAY) 180 tablet 0  . celecoxib (CELEBREX) 200 MG capsule as needed.    . clopidogrel (PLAVIX) 75 MG tablet Take 1 tablet (75 mg total) by mouth every evening. (Patient taking differently: Take 75 mg by mouth daily. ) 90 tablet 3  . furosemide (LASIX) 20 MG tablet Take 1 tablet (20 mg total) by mouth daily as needed. 30 tablet 2  . gabapentin (NEURONTIN) 300 MG capsule 1-2 tab by mouth at bedtime 180 capsule 1  . lactulose (CHRONULAC) 10 GM/15ML solution Take 45 mLs (30 g total) by mouth 2 (two) times daily as needed for mild constipation. 946  mL 2  . loratadine (CLARITIN) 10 MG tablet Take 10 mg by mouth daily.    . metFORMIN (GLUCOPHAGE-XR) 500 MG 24 hr tablet TAKE 4 TABLETS BY MOUTH EVERY MORNING (Patient taking differently: Take 500 mg by mouth daily with breakfast. ) 360 tablet 3  . methocarbamol (ROBAXIN) 500 MG tablet Take 1 tablet (500 mg total) by mouth 3 (three) times daily. 9 tablet 0  . morphine (MS CONTIN) 15 MG 12 hr tablet Take 1 tablet (15 mg total) by mouth at bedtime. For 7 days ten stop this medication. STOP: 06/16/17. Additional order: 15 mg daily at bedtime for 7 days Start: 06/17/17 Stop: 06/23/17 3 tablet 0  . nitroGLYCERIN (NITROSTAT) 0.4 MG SL tablet Place 1 tablet (0.4 mg total) under the tongue every 5 (five) minutes as needed for chest pain. 25 tablet 3  . oxyCODONE (OXY IR/ROXICODONE) 5 MG immediate release tablet 4 (four) times daily as needed.    . pantoprazole (PROTONIX) 40 MG tablet Take 40 mg by  mouth daily.    [name] PLAVIX 75 MG tablet TAKE 1 TABLET BY MOUTH EVERY DAY 90 tablet 3  . SYNTHROID 150 MCG tablet TAKE 1 TABLET(150 MCG) BY MOUTH DAILY 90 tablet 0  . vitamin B-12 1000 MCG tablet Take 1 tablet (1,000 mcg total) by mouth daily.    . Vitamin D, Ergocalciferol, (DRISDOL) 50000 units CAPS capsule Take 1 capsule (50,000 Units total) by mouth every 7 (seven) days. 12 capsule 0   No current facility-administered medications on file prior to visit.    Review of Systems  Constitutional: Negative for other unusual diaphoresis or sweats HENT: Negative for ear discharge or swelling Eyes: Negative for other worsening visual disturbances Respiratory: Negative for stridor or other swelling  Gastrointestinal: Negative for worsening distension or other blood Genitourinary: Negative for retention or other urinary change Musculoskeletal: Negative for other MSK pain or swelling Skin: Negative for color change or other new lesions Neurological: Negative for worsening tremors and other numbness  Psychiatric/Behavioral:  Negative for worsening agitation or other fatigue All other system neg per pt    Objective:   Physical Exam BP 116/72   Pulse 87   Temp 97.6 F (36.4 C) (Oral)   Ht 5\' 5"  (1.651 m)   Wt 220 lb (99.8 kg)   SpO2 96%   BMI 36.61 kg/m  VS noted, not ill appearing Constitutional: Pt appears in NAD HENT: Head: NCAT.  Right Ear: External ear normal.  Left Ear: External ear normal.  Eyes: . Pupils are equal, round, and reactive to light. Conjunctivae and EOM are normal Nose: without d/c or deformity Neck: Neck supple. Gross normal ROM Cardiovascular: Normal rate and regular rhythm.   Pulmonary/Chest: Effort normal and breath sounds without rales or wheezing.  Neurological: Pt is alert. At baseline orientation, motor grossly intact Skin: Skin is warm. No rashes, other new lesions, trace to 1+  RLE edema Psychiatric: Pt behavior is normal without agitation  No other exam findings Lab Results  Component Value Date   WBC 6.2 09/06/2017   HGB 12.4 09/06/2017   HCT 39.4 09/06/2017   PLT 368.0 09/06/2017   GLUCOSE 119 (H) 09/06/2017   CHOL 185 09/06/2017   TRIG 108.0 09/06/2017   HDL 42.40 09/06/2017   LDLDIRECT 131.0 03/27/2015   LDLCALC 121 (H) 09/06/2017   ALT 9 09/06/2017   AST 13 09/06/2017   NA 140 09/06/2017   K 3.9 09/06/2017   CL 104 09/06/2017   CREATININE 1.00 09/06/2017   BUN 15 09/06/2017   CO2 28 09/06/2017   TSH 0.51 03/09/2017   INR 1.08 01/22/2016   HGBA1C 6.3 09/06/2017   MICROALBUR 11.8 (H) 03/09/2017       Assessment & Plan:

## 2017-10-25 NOTE — Assessment & Plan Note (Signed)
Ok for [facility] [facility] 5 qhs prn

## 2017-10-25 NOTE — Assessment & Plan Note (Signed)
By exam seems c/w venous insufficiency type, for hct 12.5 qd, elevate legs with sitting, compression stocking for RLE during day, and wt loss, low salt diet

## 2017-10-25 NOTE — Patient Instructions (Addendum)
Review your medication list to make sure you are taking all of your medications as prescribed.   Take furosemide (lasix) as needed for swelling.   Resume taking Plavix.   If you need a refill on your cardiac medications before your next appointment, please call your pharmacy.   Lab work: None Ordered   If you have labs (blood work) drawn today and your tests are completely normal, you will receive your results only by: [name] MyChart Message (if you have MyChart) OR . A paper copy in the mail If you have any lab test that is abnormal or we need to change your treatment, we will call you to review the results.  Testing/Procedures: None Ordered  Follow-Up: At [facility], you and your health needs are our priority.  As part of our continuing mission to provide you with exceptional heart care, we have created designated Provider Care Teams.  These Care Teams include your primary Cardiologist (physician) and Advanced Practice Providers (APPs -  Physician Assistants and Nurse Practitioners) who all work together to provide you with the care you need, when you need it. You will need a follow up appointment in:  3 months. You may see Sherren Mocha, MD or one of the following Advanced Practice Providers on your designated Care Team: Richardson Dopp, PA-C [place], [place] . Daune Perch, NP  Any Other Special Instructions Will Be Listed Below (If Applicable).   Peripheral Edema Peripheral edema is swelling that is caused by a buildup of fluid. Peripheral edema most often affects the lower legs, ankles, and feet. It can also develop in the arms, hands, and face. The area of the body that has peripheral edema will look swollen. It may also feel heavy or warm. Your clothes may start to feel tight. Pressing on the area may make a temporary dent in your skin. You may not be able to move your arm or leg as much as usual. There are many causes of peripheral edema. It can be a complication of other  diseases, such as congestive heart failure, kidney disease, or a problem with your blood circulation. It also can be a side effect of certain medicines. It often happens to women during pregnancy. Sometimes, the cause is not known. Treating the underlying condition is often the only treatment for peripheral edema. Follow these instructions at home: Pay attention to any changes in your symptoms. Take these actions to help with your discomfort:  Raise (elevate) your legs while you are sitting or lying down.  Move around often to prevent stiffness and to lessen swelling. Do not sit or stand for long periods of time.  Wear support stockings as told by your health care provider.  Follow instructions from your health care provider about limiting salt (sodium) in your diet. Sometimes eating less salt can reduce swelling.  Take over-the-counter and prescription medicines only as told by your health care provider. Your health care provider may prescribe medicine to help your body get rid of excess water (diuretic).  Keep all follow-up visits as told by your health care provider. This is important.  Contact a health care provider if:  You have a fever.  Your edema starts suddenly or is getting worse, especially if you are pregnant or have a medical condition.  You have swelling in only one leg.  You have increased swelling and pain in your legs. Get help right away if:  You develop shortness of breath, especially when you are lying down.  You have  pain in your chest or abdomen.  You feel weak.  You faint. This information is not intended to replace advice given to you by your health care provider. Make sure you discuss any questions you have with your health care provider. Document Released: 02/11/2004 Document Revised: 06/08/2015 Document Reviewed: 07/16/2014 Elsevier Interactive Patient Education  2018 [employer].   Low-Sodium Eating Plan Sodium, which is an element that makes up  salt, helps you maintain a healthy balance of fluids in your body. Too much sodium can increase your blood pressure and cause fluid and waste to be held in your body. Your health care provider or dietitian may recommend following this plan if you have high blood pressure (hypertension), kidney disease, liver disease, or heart failure. Eating less sodium can help lower your blood pressure, reduce swelling, and protect your heart, liver, and kidneys. What are tips for following this plan? General guidelines  Most people on this plan should limit their sodium intake to 1,500-2,000 mg (milligrams) of sodium each day. Reading food labels  The Nutrition Facts label lists the amount of sodium in one serving of the food. If you eat more than one serving, you must multiply the listed amount of sodium by the number of servings.  Choose foods with less than 140 mg of sodium per serving.  Avoid foods with 300 mg of sodium or more per serving. Shopping  Look for lower-sodium products, often labeled as "low-sodium" or "no salt added."  Always check the sodium content even if foods are labeled as "unsalted" or "no salt added".  Buy fresh foods. ? Avoid canned foods and premade or frozen meals. ? Avoid canned, cured, or processed meats  Buy breads that have less than 80 mg of sodium per slice. Cooking  Eat more home-cooked food and less restaurant, buffet, and fast food.  Avoid adding salt when cooking. Use salt-free seasonings or herbs instead of table salt or sea salt. Check with your health care provider or pharmacist before using salt substitutes.  Cook with plant-based oils, such as canola, sunflower, or olive oil. Meal planning  When eating at a restaurant, ask that your food be prepared with less salt or no salt, if possible.  Avoid foods that contain MSG (monosodium glutamate). MSG is sometimes added to [place] food, bouillon, and some canned foods. What foods are recommended? The items  listed may not be a complete list. Talk with your dietitian about what dietary choices are best for you. Grains Low-sodium cereals, including oats, puffed wheat and rice, and shredded wheat. Low-sodium crackers. Unsalted rice. Unsalted pasta. Low-sodium bread. Whole-grain breads and whole-grain pasta. Vegetables Fresh or frozen vegetables. "No salt added" canned vegetables. "No salt added" tomato sauce and paste. Low-sodium or reduced-sodium tomato and vegetable juice. Fruits Fresh, frozen, or canned fruit. Fruit juice. Meats and other protein foods Fresh or frozen (no salt added) meat, poultry, seafood, and fish. Low-sodium canned tuna and salmon. Unsalted nuts. Dried peas, beans, and lentils without added salt. Unsalted canned beans. Eggs. Unsalted nut butters. Dairy Milk. Soy milk. Cheese that is naturally low in sodium, such as ricotta cheese, fresh mozzarella, or Swiss cheese Low-sodium or reduced-sodium cheese. Cream cheese. Yogurt. Fats and oils Unsalted butter. Unsalted margarine with no trans fat. Vegetable oils such as canola or olive oils. Seasonings and other foods Fresh and dried herbs and spices. Salt-free seasonings. Low-sodium mustard and ketchup. Sodium-free salad dressing. Sodium-free light mayonnaise. Fresh or refrigerated horseradish. Lemon juice. Vinegar. Homemade, reduced-sodium, or low-sodium soups.  Unsalted popcorn and pretzels. Low-salt or salt-free chips. What foods are not recommended? The items listed may not be a complete list. Talk with your dietitian about what dietary choices are best for you. Grains Instant hot cereals. Bread stuffing, pancake, and biscuit mixes. Croutons. Seasoned rice or pasta mixes. Noodle soup cups. Boxed or frozen macaroni and cheese. Regular salted crackers. Self-rising flour. Vegetables Sauerkraut, pickled vegetables, and relishes. Olives. [place] fries. Onion rings. Regular canned vegetables (not low-sodium or reduced-sodium). Regular  canned tomato sauce and paste (not low-sodium or reduced-sodium). Regular tomato and vegetable juice (not low-sodium or reduced-sodium). Frozen vegetables in sauces. Meats and other protein foods Meat or fish that is salted, canned, smoked, spiced, or pickled. Bacon, ham, sausage, hotdogs, corned beef, chipped beef, packaged lunch meats, salt pork, jerky, pickled herring, anchovies, regular canned tuna, sardines, salted nuts. Dairy Processed cheese and cheese spreads. Cheese curds. Blue cheese. Feta cheese. String cheese. Regular cottage cheese. Buttermilk. Canned milk. Fats and oils Salted butter. Regular margarine. Ghee. Bacon fat. Seasonings and other foods Onion salt, garlic salt, seasoned salt, table salt, and sea salt. Canned and packaged gravies. Worcestershire sauce. Tartar sauce. Barbecue sauce. Teriyaki sauce. Soy sauce, including reduced-sodium. Steak sauce. Fish sauce. Oyster sauce. Cocktail sauce. Horseradish that you find on the shelf. Regular ketchup and mustard. Meat flavorings and tenderizers. Bouillon cubes. Hot sauce and Tabasco sauce. Premade or packaged marinades. Premade or packaged taco seasonings. Relishes. Regular salad dressings. Salsa. Potato and tortilla chips. Corn chips and puffs. Salted popcorn and pretzels. Canned or dried soups. Pizza. Frozen entrees and pot pies. Summary  Eating less sodium can help lower your blood pressure, reduce swelling, and protect your heart, liver, and kidneys.  Most people on this plan should limit their sodium intake to 1,500-2,000 mg (milligrams) of sodium each day.  Canned, boxed, and frozen foods are high in sodium. Restaurant foods, fast foods, and pizza are also very high in sodium. You also get sodium by adding salt to food.  Try to cook at home, eat more fresh fruits and vegetables, and eat less fast food, canned, processed, or prepared foods. This information is not intended to replace advice given to you by your health care  provider. Make sure you discuss any questions you have with your health care provider. Document Released: 06/25/2001 Document Revised: 12/28/2015 Document Reviewed: 12/28/2015 Elsevier Interactive Patient Education  [employer].

## 2017-10-25 NOTE — Progress Notes (Signed)
Cardiology Office Note:    Date:  10/25/2017   ID:  [name], DOB 07/27/1945, MRN [id number]  PCP:  Biagio Borg, MD  Cardiologist:  Sherren Mocha, MD  Referring MD: Biagio Borg, MD   Chief Complaint  Patient presents with  . Follow-up    leg swelling    History of Present Illness:    [name] is a 72 y.o. female with a past medical history significant for CAD (inferior MI 1998 s/p PCI of RCA, stenting of Cx 04/2010, DES to prox LAD 05/2013, DES x 3 in 10/2015), AAA s/p stent graft repair in 01/2016, anxiety, chronic LBP, DM, depression, HTN, HLD, hypothyroidism, prior lower GIB/diverticular bleed 2012, obesity, former tobacco (30-40 yrs).  The patient has a history of significant vascular disease with multiple prior PCI (last 2017) as well as AAA repair. She follows with Dr. Burt Knack in clinic and was last seen for preoperative evaluation in 11/2016 prior to back surgery. At that visit she reported limiting DOE and atypical chest discomfort. Her symptoms were felt to be multifactorial withapulmonary component. She underwent nuclear stress test that showed borderline EF with small apical defect felt to be low risk.   Admitted 05/2017 for chest pain. Felt atypical, intermittent not associated with rest or activity.She was tender with palpation. Trop neg x3. D-dimer was elevated but CTA neg for PE. Echowith normal EF and no WMA. Noted anemic with hgb of 7.7>>> felt better after transfusion.   She was seen by Robbie Lis, PA on 07/17/17 with lower extremity edema present since her back surgery. She was advised to try PRN lasix and low sodium diet. She underwent lower extremity dopplers on 10/20/2017 per her PCP that were negative for DVT.   She is here today for right ankle/foot edema that she says is better but still present, unable to wear a regular shoe. No chest pain/pressure/tightness, dyspnea, orthopnea or PND, palpitations, lightheadnsss or syncope. She says that she pushed  her car brake really hard with her right foot. She denies any right foot pain and is able to bear wt and walk.   She does not recognize hydrochlorothiazide on the med list and may not be taking it. She has lasix to use as needed for edema but has not taken any. She has been elevating her RLE but does not use compression stockings.   Past Medical History:  Diagnosis Date  . AAA (abdominal aortic aneurysm) ([place])    a. s/p stent graft repair 01/2016.  [name] Allergic rhinitis, cause unspecified   . Anxiety state, unspecified   . Chronic LBP   . Coronary artery disease    a. inferior MI 1998 s/p PCI of RCA. b. stenting of Cx 04/2010. c. DES to prox LAD 05/2013. d. DES x 3 in 10/2015.  [name] Degeneration of lumbar or lumbosacral intervertebral disc   . Depressive disorder, not elsewhere classified   . Diabetes mellitus    TYPE II  . Gout 08/22/2013  . Hyperlipidemia   . Hypertension   . Hypothyroidism   . Lower GI bleed 06/2010   Diverticular bleed  . Noncompliance with medications 02/26/2014  . Obesity, unspecified     Past Surgical History:  Procedure Laterality Date  . CARDIAC CATHETERIZATION     PCI OF BOTH THE CIRCUMFLEX AND LEFT ANTERIOR DESCENDING ARTERY  . CARDIAC CATHETERIZATION N/A 10/29/2015   Procedure: Coronary Stent Intervention;  Surgeon: Nelva Bush, MD;  Location: [place] [facility];  Service: Cardiovascular;  Laterality: N/A;  . CARDIAC CATHETERIZATION N/A 10/29/2015   Procedure: Coronary/Graft Angiography;  Surgeon: Nelva Bush, MD;  Location: [place] [facility];  Service: Cardiovascular;  Laterality: N/A;  . CARDIAC CATHETERIZATION N/A 10/29/2015   Procedure: Intravascular Pressure Wire/FFR Study;  Surgeon: Nelva Bush, MD;  Location: [facility];  Service: Cardiovascular;  Laterality: N/A;  . CESAREAN SECTION    . ENDOVASCULAR STENT INSERTION N/A 01/22/2016   Procedure: ABDOMINAL AORTIC ENDOVASCULAR STENT GRAFT INSERTION;  Surgeon: Rosetta Posner, MD;  Location:  [facility];  Service: Vascular;  Laterality: N/A;  . HEART STENT  04-2010  and  Jun 07, 2013   X 3  . LEFT HEART CATHETERIZATION WITH CORONARY ANGIOGRAM N/A 06/07/2013   Procedure: LEFT HEART CATHETERIZATION WITH CORONARY ANGIOGRAM;  Surgeon: [name], MD;  Location[facility];  Service: Cardiovascular;  Laterality: N/A;  . LEFT HEART CATHETERIZATION WITH CORONARY ANGIOGRAM N/A 02/25/2014   Procedure: LEFT HEART CATHETERIZATION WITH CORONARY ANGIOGRAM;  Surgeon: Troy Sine, MD;  Location: [facility] CATH LAB;  Service: Cardiovascular;  Laterality: N/A;  . LUMBAR FUSION  01/2007   DR. Patrice Paradise...3-LEVEL WITH FIXATION  . OOPHORECTOMY     BSO? pt.unsure  . PARATHYROIDECTOMY    . SPINE SURGERY    . THYROIDECTOMY    . TOTAL ABDOMINAL HYSTERECTOMY      Current Medications: Current Meds  Medication Sig  . allopurinol (ZYLOPRIM) 300 MG tablet Take 1 tablet (300 mg total) by mouth daily.  [name] aspirin 81 MG EC tablet Take 81 mg by mouth daily.   [name] atenolol (TENORMIN) 25 MG tablet Take 25 mg by mouth 2 (two) times daily. Patient taking daily  . celecoxib (CELEBREX) 200 MG capsule as needed.  . clopidogrel (PLAVIX) 75 MG tablet Take 1 tablet (75 mg total) by mouth every evening.  . furosemide (LASIX) 20 MG tablet Take 1 tablet (20 mg total) by mouth daily as needed.  . gabapentin (NEURONTIN) 300 MG capsule 1-2 tab by mouth at bedtime  . hydrochlorothiazide (MICROZIDE) 12.5 MG capsule Take 1 capsule (12.5 mg total) by mouth daily.  [name] lactulose (CHRONULAC) 10 GM/15ML solution Take 45 mLs (30 g total) by mouth 2 (two) times daily as needed for mild constipation.  [name] loratadine (CLARITIN) 10 MG tablet Take 10 mg by mouth daily.  . metFORMIN (GLUCOPHAGE-XR) 500 MG 24 hr tablet TAKE 4 TABLETS BY MOUTH EVERY MORNING (Patient taking differently: Take 500 mg by mouth daily with breakfast. )  . methocarbamol (ROBAXIN) 500 MG tablet Take 1 tablet (500 mg total) by mouth 3 (three) times daily.  [name] morphine (MS  CONTIN) 15 MG 12 hr tablet Take 1 tablet (15 mg total) by mouth at bedtime. For 7 days ten stop this medication. STOP: 06/16/17. Additional order: 15 mg daily at bedtime for 7 days Start: 06/17/17 Stop: 06/23/17  . nitroGLYCERIN (NITROSTAT) 0.4 MG SL tablet Place 1 tablet (0.4 mg total) under the tongue every 5 (five) minutes as needed for chest pain.  [name] oxyCODONE (OXY IR/ROXICODONE) 5 MG immediate release tablet 4 (four) times daily as needed.  . pantoprazole (PROTONIX) 40 MG tablet Take 40 mg by mouth daily.  [name] PLAVIX 75 MG tablet TAKE 1 TABLET BY MOUTH EVERY DAY  . SYNTHROID 150 MCG tablet TAKE 1 TABLET(150 MCG) BY MOUTH DAILY  . vitamin B-12 1000 MCG tablet Take 1 tablet (1,000 mcg total) by mouth daily.  . Vitamin D, Ergocalciferol, (DRISDOL) 50000 units CAPS capsule Take 1  capsule (50,000 Units total) by mouth every 7 (seven) days.  [name] zolpidem (AMBIEN) 5 MG tablet Take 1 tablet (5 mg total) by mouth at bedtime as needed for sleep.     Allergies:   Prilosec [omeprazole]; Miconazole nitrate; Crestor [rosuvastatin calcium]; Augmentin [amoxicillin-pot clavulanate]; and Doxycycline   Social History   Socioeconomic History  . Marital status: Divorced    Spouse name: Not on file  . Number of children: 3  . Years of education: Not on file  . Highest education level: Not on file  Occupational History  . 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 Financial resource strain: Not on file  . Food insecurity:    Worry: Not on file    Inability: Not on file  . Transportation needs:    Medical: Not on file    Non-medical: Not on file  Tobacco Use  . Smoking status: Former Smoker    Years: 30.00    Types: Cigarettes    Last attempt to quit: 05/31/1986    Years since quitting: 31.4  . Smokeless tobacco: Never Used  Substance and Sexual Activity  . Alcohol use: No  . Drug use: No  . Sexual activity: Not Currently     Comment: 1st intercourse 72 yo-Fewer than 5 partners  Lifestyle  . Physical activity:    Days per week: Not on file    Minutes per session: Not on file  . Stress: Not on file  Relationships  . Social connections:    Talks on phone: Not on file    Gets together: Not on file    Attends religious service: Not on file    Active member of club or organization: Not on file    Attends meetings of clubs or organizations: Not on file    Relationship status: Not on file  Other Topics Concern  . Not on file  Social History Narrative   DIVORCED   3 CHILDREN   PATIENT SIGNED A DESIGNATED PARTY RELEASE TO ALLOW HER DAUGHTER, TRAMAINE Sheehy, TO HAVE ACCESS TO HER MEDICAL RECORDS/INFORMATION. [name], May 04, 2009 @ 3:27 PM   Smokes cigarettes on rare occasions.     Family History: The patient's family history includes Arthritis in her maternal aunt; Breast cancer in her maternal aunt; Diabetes in her brother and maternal aunt; Heart attack (age of onset: 12) in her mother; Heart attack (age of onset: 60) in her father; Heart disease in her father and mother. There is no history of Colon cancer. ROS:   Please see the history of present illness.     All other systems reviewed and are negative.  EKGs/Labs/Other Studies Reviewed:    The following studies were reviewed today:  [place] lower extremities 10/20/2017 Right: No evidence of deep vein thrombosis in the lower extremity. No indirect evidence of obstruction proximal to the inguinal ligament. No cystic structure found in the popliteal fossa. Left: No evidence of common femoral vein obstruction.   Echocardiogram:06/13/17 Study Conclusions - Left ventricle: The cavity size was normal. Systolic function was normal. The estimated ejection fraction was in the range of 55% to 60%. Wall motion was normal; there were no regional wall motion abnormalities. Doppler parameters are consistent with abnormal left ventricular relaxation  (grade 1 diastolic dysfunction). Doppler parameters are consistent with indeterminate ventricular filling pressure. - Aortic valve: Transvalvular velocity was within the normal range.  There was no stenosis. There was no regurgitation. Valve area (VTI): 3.25 cm^2. Valve area (Vmax): 3.08 cm^2. Valve area (Vmean): 3.16 cm^2. - Mitral valve: Transvalvular velocity was within the normal range. There was no evidence for stenosis. There was no regurgitation. Valve area by pressure half-time: 1.06 cm^2. - Left atrium: The atrium was moderately dilated. - Right ventricle: The cavity size was normal. Wall thickness was normal. Systolic function was normal. - Tricuspid valve: There was trivial regurgitation. - Pulmonary arteries: Systolic pressure was within the normal range. PA peak pressure: 20 mm Hg (S). - Pericardium, extracardiac: A trivial pericardial effusion was identified.  Stress test 11/2016  Nuclear stress EF: 50%. No wall motion abnormalities  There was no ST segment deviation noted during stress.  Defect 1: There is a small defect of mild severity present in the apical septal and apex location. This may be indicative of breast attenuation artifact.  This is a low risk study. No large areas of ischemia noted   Coronary Stent Intervention   10/2015  Intravascular Pressure Wire/FFR Study  Coronary/Graft Angiography  Conclusion   Conclusions: 1. Multivessel coronary artery disease, including 60% ostial and 70% proximal/mid RCA disease that is hemodynamically significant by FFR (0.71). Mild in-stent restenosis of LAD stents, as well as 60% stenosis in the mid LCx adjacent to previously stented large OM are also present. 2. Successful FFR-guided PCI of the RCA from the ostium to the midvessel with three overlapping Promus Premier stents post-dilated to 3.25 mm with 0% residual stenosis and TIMI-3 flow. 3. Patient had significant pain chest pain beginning  with FFR and continuing throughout intervention despite aggressive sedation and analgesia. No clear angiographic or electrocardiographic etiology was identified. Pain had returned to pre-cath level by the end of the procedure.  Recommendations: 1. Transfer to [facility] for aggressive medical management of chest pain, including weaning of NTG infusion as tolerated. 2. Dual antiplatelet therapy with aspirin and ticagrelor for at least 12 months, ideally longer. 3. Aggressive secondary prevention. 4. If patient continues to have chest pain, consider repeat catheterization with FFR and/or PCI to mid LCx. 5. Due to marked tortuosity of the innominate/right subclavian artery, consider alternate vascular access for further cardiac catheterizations.     EKG:  EKG is not  ordered today.    Recent Labs: 03/09/2017: TSH 0.51 06/11/2017: B Natriuretic Peptide 110.3 09/06/2017: ALT 9; BUN 15; Creatinine, Ser 1.00; Hemoglobin 12.4; Platelets 368.0; Potassium 3.9; Sodium 140 10/19/2017: Pro B Natriuretic peptide (BNP) 36.0   Recent Lipid Panel    Component Value Date/Time   CHOL 185 09/06/2017 1427   CHOL 181 11/30/2016 1053   TRIG 108.0 09/06/2017 1427   TRIG 102 01/02/2006 0825   HDL 42.40 09/06/2017 1427   HDL 29 (L) 11/30/2016 1053   CHOLHDL 4 09/06/2017 1427   VLDL 21.6 09/06/2017 1427   LDLCALC 121 (H) 09/06/2017 1427   LDLCALC 123 (H) 11/30/2016 1053   LDLDIRECT 131.0 03/27/2015 1137    Physical Exam:    VS:  BP 122/72   Pulse (!) 103   Ht 5\' 5"  (1.651 m)   Wt 222 lb (100.7 kg)   SpO2 97%   BMI 36.94 kg/m     Wt Readings from Last 3 Encounters:  10/25/17 222 lb (100.7 kg)  10/25/17 220 lb (99.8 kg)  10/19/17 225 lb (102.1 kg)     Physical Exam  Constitutional: She is oriented to person, place, and time. She appears well-developed and well-nourished. No distress.  HENT:  Head: Normocephalic and atraumatic.  Neck: Normal range of motion. Neck supple. No JVD present.    Cardiovascular: Normal rate, regular rhythm and intact distal pulses. Exam reveals no gallop and no friction rub.  No murmur heard. Pulmonary/Chest: Effort normal and breath sounds normal. No respiratory distress. She has no wheezes. She has no rales.  Abdominal: Soft. Bowel sounds are normal.  Musculoskeletal: Normal range of motion. She exhibits edema. She exhibits no tenderness.  Trace edema of lateral right ankle/foot  Neurological: She is alert and oriented to person, place, and time.  Skin: Skin is warm and dry.  Psychiatric: She has a normal mood and affect. Her behavior is normal. Judgment and thought content normal.  Vitals reviewed.   ASSESSMENT:    1. Coronary artery disease involving native coronary artery of native heart without angina pectoris   2. Edema, unspecified type   3. History of endovascular stent graft for abdominal aortic aneurysm (AAA)   4. Mixed hyperlipidemia   5. Essential hypertension    PLAN:    In order of problems listed above:  Right Lower extremity edema -RLE Negative for DVT by [place].  -Today she says swelling is better. Mostly on lateral aspect of ankle and foot. Possibility of an injury. No pain and is able to bear wt and walk. Offered an xray but she does not want that at this time. Will work on diuresis and if it does not continue to improve, consider xray -Has prn lasix but has not been using it. Advise to use it for a few days to decrease edema.  -Low sodium diet, elevate extremity, compression stockings.   CAD s/p multiple PCI -No anginal type chest pain or shortness of breath.  She has mild lower sternal/epigastric achiness which resolved with rubbing.  Seems muscle achiness due to back surgery.  Continue aspirin and Plavix.  -She stopped the plavix as she thought is was causing the swelling. Advised to restart Plavix.   AAA s/p repair - Followed by [facility]  HLD - 03/09/2017: Cholesterol 138; HDL 28.30; LDL Cholesterol 84; Triglycerides  131.0; VLDL 26.2  - Continue Lipitor  HTN - Stable on current medications.    Medication Adjustments/Labs and Tests Ordered: Current medicines are reviewed at length with the patient today.  Concerns regarding medicines are outlined above. Labs and tests ordered and medication changes are outlined in the patient instructions below:  Patient Instructions  Review your medication list to make sure you are taking all of your medications as prescribed.   Take furosemide (lasix) as needed for swelling.   Resume taking Plavix.   If you need a refill on your cardiac medications before your next appointment, please call your pharmacy.   Lab work: None Ordered   If you have labs (blood work) drawn today and your tests are completely normal, you will receive your results only by: [name] MyChart Message (if you have MyChart) OR . A paper copy in the mail If you have any lab test that is abnormal or we need to change your treatment, we will call you to review the results.  Testing/Procedures: None Ordered  Follow-Up: At [facility], you and your health needs are our priority.  As part of our continuing mission to provide you with exceptional heart care, we have created designated Provider Care Teams.  These Care Teams include your primary Cardiologist (physician) and Advanced Practice Providers (APPs -  Physician Assistants and Nurse Practitioners) who all work together to provide you  with the care you need, when you need it. You will need a follow up appointment in:  3 months. You may see Sherren Mocha, MD or one of the following Advanced Practice Providers on your designated Care Team: Richardson Dopp, PA-C [place], [place] . Daune Perch, NP  Any Other Special Instructions Will Be Listed Below (If Applicable).   Peripheral Edema Peripheral edema is swelling that is caused by a buildup of fluid. Peripheral edema most often affects the lower legs, ankles, and feet. It can also develop in  the arms, hands, and face. The area of the body that has peripheral edema will look swollen. It may also feel heavy or warm. Your clothes may start to feel tight. Pressing on the area may make a temporary dent in your skin. You may not be able to move your arm or leg as much as usual. There are many causes of peripheral edema. It can be a complication of other diseases, such as congestive heart failure, kidney disease, or a problem with your blood circulation. It also can be a side effect of certain medicines. It often happens to women during pregnancy. Sometimes, the cause is not known. Treating the underlying condition is often the only treatment for peripheral edema. Follow these instructions at home: Pay attention to any changes in your symptoms. Take these actions to help with your discomfort:  Raise (elevate) your legs while you are sitting or lying down.  Move around often to prevent stiffness and to lessen swelling. Do not sit or stand for long periods of time.  Wear support stockings as told by your health care provider.  Follow instructions from your health care provider about limiting salt (sodium) in your diet. Sometimes eating less salt can reduce swelling.  Take over-the-counter and prescription medicines only as told by your health care provider. Your health care provider may prescribe medicine to help your body get rid of excess water (diuretic).  Keep all follow-up visits as told by your health care provider. This is important.  Contact a health care provider if:  You have a fever.  Your edema starts suddenly or is getting worse, especially if you are pregnant or have a medical condition.  You have swelling in only one leg.  You have increased swelling and pain in your legs. Get help right away if:  You develop shortness of breath, especially when you are lying down.  You have pain in your chest or abdomen.  You feel weak.  You faint. This information is not  intended to replace advice given to you by your health care provider. Make sure you discuss any questions you have with your health care provider. Document Released: 02/11/2004 Document Revised: 06/08/2015 Document Reviewed: 07/16/2014 Elsevier Interactive Patient Education  2018 [employer].   Low-Sodium Eating Plan Sodium, which is an element that makes up salt, helps you maintain a healthy balance of fluids in your body. Too much sodium can increase your blood pressure and cause fluid and waste to be held in your body. Your health care provider or dietitian may recommend following this plan if you have high blood pressure (hypertension), kidney disease, liver disease, or heart failure. Eating less sodium can help lower your blood pressure, reduce swelling, and protect your heart, liver, and kidneys. What are tips for following this plan? General guidelines  Most people on this plan should limit their sodium intake to 1,500-2,000 mg (milligrams) of sodium each day. Reading food labels  The Nutrition Facts label  lists the amount of sodium in one serving of the food. If you eat more than one serving, you must multiply the listed amount of sodium by the number of servings.  Choose foods with less than 140 mg of sodium per serving.  Avoid foods with 300 mg of sodium or more per serving. Shopping  Look for lower-sodium products, often labeled as "low-sodium" or "no salt added."  Always check the sodium content even if foods are labeled as "unsalted" or "no salt added".  Buy fresh foods. ? Avoid canned foods and premade or frozen meals. ? Avoid canned, cured, or processed meats  Buy breads that have less than 80 mg of sodium per slice. Cooking  Eat more home-cooked food and less restaurant, buffet, and fast food.  Avoid adding salt when cooking. Use salt-free seasonings or herbs instead of table salt or sea salt. Check with your health care provider or pharmacist before using salt  substitutes.  Cook with plant-based oils, such as canola, sunflower, or olive oil. Meal planning  When eating at a restaurant, ask that your food be prepared with less salt or no salt, if possible.  Avoid foods that contain MSG (monosodium glutamate). MSG is sometimes added to [place] food, bouillon, and some canned foods. What foods are recommended? The items listed may not be a complete list. Talk with your dietitian about what dietary choices are best for you. Grains Low-sodium cereals, including oats, puffed wheat and rice, and shredded wheat. Low-sodium crackers. Unsalted rice. Unsalted pasta. Low-sodium bread. Whole-grain breads and whole-grain pasta. Vegetables Fresh or frozen vegetables. "No salt added" canned vegetables. "No salt added" tomato sauce and paste. Low-sodium or reduced-sodium tomato and vegetable juice. Fruits Fresh, frozen, or canned fruit. Fruit juice. Meats and other protein foods Fresh or frozen (no salt added) meat, poultry, seafood, and fish. Low-sodium canned tuna and salmon. Unsalted nuts. Dried peas, beans, and lentils without added salt. Unsalted canned beans. Eggs. Unsalted nut butters. Dairy Milk. Soy milk. Cheese that is naturally low in sodium, such as ricotta cheese, fresh mozzarella, or Swiss cheese Low-sodium or reduced-sodium cheese. Cream cheese. Yogurt. Fats and oils Unsalted butter. Unsalted margarine with no trans fat. Vegetable oils such as canola or olive oils. Seasonings and other foods Fresh and dried herbs and spices. Salt-free seasonings. Low-sodium mustard and ketchup. Sodium-free salad dressing. Sodium-free light mayonnaise. Fresh or refrigerated horseradish. Lemon juice. Vinegar. Homemade, reduced-sodium, or low-sodium soups. Unsalted popcorn and pretzels. Low-salt or salt-free chips. What foods are not recommended? The items listed may not be a complete list. Talk with your dietitian about what dietary choices are best for  you. Grains Instant hot cereals. Bread stuffing, pancake, and biscuit mixes. Croutons. Seasoned rice or pasta mixes. Noodle soup cups. Boxed or frozen macaroni and cheese. Regular salted crackers. Self-rising flour. Vegetables Sauerkraut, pickled vegetables, and relishes. Olives. [place] fries. Onion rings. Regular canned vegetables (not low-sodium or reduced-sodium). Regular canned tomato sauce and paste (not low-sodium or reduced-sodium). Regular tomato and vegetable juice (not low-sodium or reduced-sodium). Frozen vegetables in sauces. Meats and other protein foods Meat or fish that is salted, canned, smoked, spiced, or pickled. Bacon, ham, sausage, hotdogs, corned beef, chipped beef, packaged lunch meats, salt pork, jerky, pickled herring, anchovies, regular canned tuna, sardines, salted nuts. Dairy Processed cheese and cheese spreads. Cheese curds. Blue cheese. Feta cheese. String cheese. Regular cottage cheese. Buttermilk. Canned milk. Fats and oils Salted butter. Regular margarine. Ghee. Bacon fat. Seasonings and other foods Onion salt, garlic salt,  seasoned salt, table salt, and sea salt. Canned and packaged gravies. Worcestershire sauce. Tartar sauce. Barbecue sauce. Teriyaki sauce. Soy sauce, including reduced-sodium. Steak sauce. Fish sauce. Oyster sauce. Cocktail sauce. Horseradish that you find on the shelf. Regular ketchup and mustard. Meat flavorings and tenderizers. Bouillon cubes. Hot sauce and Tabasco sauce. Premade or packaged marinades. Premade or packaged taco seasonings. Relishes. Regular salad dressings. Salsa. Potato and tortilla chips. Corn chips and puffs. Salted popcorn and pretzels. Canned or dried soups. Pizza. Frozen entrees and pot pies. Summary  Eating less sodium can help lower your blood pressure, reduce swelling, and protect your heart, liver, and kidneys.  Most people on this plan should limit their sodium intake to 1,500-2,000 mg (milligrams) of sodium each  day.  Canned, boxed, and frozen foods are high in sodium. Restaurant foods, fast foods, and pizza are also very high in sodium. You also get sodium by adding salt to food.  Try to cook at home, eat more fresh fruits and vegetables, and eat less fast food, canned, processed, or prepared foods. This information is not intended to replace advice given to you by your health care provider. Make sure you discuss any questions you have with your health care provider. Document Released: 06/25/2001 Document Revised: 12/28/2015 Document Reviewed: 12/28/2015 Elsevier Interactive Patient Education  2018 [place], Daune Perch, NP  10/25/2017 5:43 PM    [place]

## 2017-10-25 NOTE — Patient Instructions (Signed)
Please take all new medication as prescribed - the low dose fluid pill, and the generic Ambien for sleep as needed  Please keep legs elevated when sitting  Please wear compression stocking to the right leg during day hours only  OK to restart the Plavix  Please continue all other medications as before, and refills have been done if requested.  Please have the pharmacy call with any other refills you may need.  Please continue your efforts at being more active, low cholesterol diet, and weight control.  Please keep your appointments with your specialists as you may have planned

## 2017-10-25 NOTE — Assessment & Plan Note (Signed)
Stable, but encouraged to restart the plavix

## 2017-11-16 ENCOUNTER — Telehealth: Payer: Self-pay

## 2017-11-16 MED ORDER — FUROSEMIDE 20 MG PO TABS: 20.0000 mg | ORAL_TABLET | Freq: Every day | ORAL | 3 refills | Status: DC | PRN

## 2017-11-16 NOTE — Telephone Encounter (Signed)
The patient is on Dr. Antionette Char schedule for tomorrow, but was seen 10/25/2017 with Maryla Morrow and was told to follow-up with Dr. Burt Knack in 3 months.   Called to reschedule visit with Dr. Burt Knack to January.  Left message to call back.

## 2017-11-16 NOTE — Telephone Encounter (Signed)
[name] reports her LE swelling has not improved since her visit 10/9. When asked if she has taken any Lasix (she was instructed to take it as needed), she said she has none and has not taken any "in a long time." Instructed her to avoid salt, elevate swollen legs when sitting, and to take her Lasix as directed for swelling. She wants to see if this works prior to coming in Architectural technologist. Scheduled patient for follow-up in a couple months. She will call prior to that time if symptoms do not improve. Lasix called in.  She was grateful for call and agrees with treatment plan.

## 2017-11-17 ENCOUNTER — Ambulatory Visit: Payer: Medicare Other | Admitting: Cardiovascular Disease

## 2017-12-02 ENCOUNTER — Other Ambulatory Visit: Payer: Self-pay | Admitting: Internal Medicine

## 2017-12-06 ENCOUNTER — Other Ambulatory Visit: Payer: Self-pay | Admitting: Internal Medicine

## 2018-01-15 ENCOUNTER — Ambulatory Visit: Payer: Self-pay

## 2018-01-15 NOTE — Telephone Encounter (Signed)
Pt. Reports she noticed swelling to both lower leg last week and it has gotten worse. From feet to knees. "I can put my shoes on though." Denies any chest pain or shortness of breath. No redness. No availability at [facility]. Appointment made with Dr. Zigmund Daniel.   Reason for Disposition . [1] MODERATE leg swelling (e.g., swelling extends up to knees) AND [2] new onset or worsening  Answer Assessment - Initial Assessment Questions 1. ONSET: "When did the swelling start?" (e.g., minutes, hours, days)     1 week ago 2. LOCATION: "What part of the leg is swollen?"  "Are both legs swollen or just one leg?"     From feet to knee 3. SEVERITY: "How bad is the swelling?" (e.g., localized; mild, moderate, severe)  - Localized - small area of swelling localized to one leg  - MILD pedal edema - swelling limited to foot and ankle, pitting edema < 1/4 inch (6 mm) deep, rest and elevation eliminate most or all swelling  - MODERATE edema - swelling of lower leg to knee, pitting edema > 1/4 inch (6 mm) deep, rest and elevation only partially reduce swelling  - SEVERE edema - swelling extends above knee, facial or hand swelling present      Moderate 4. REDNESS: "Does the swelling look red or infected?"     A little red 5. PAIN: "Is the swelling painful to touch?" If so, ask: "How painful is it?"   (Scale 1-10; mild, moderate or severe)     A little - 10 6. FEVER: "Do you have a fever?" If so, ask: "What is it, how was it measured, and when did it start?"      Maybe 7. CAUSE: "What do you think is causing the leg swelling?"     Unsure 8. MEDICAL HISTORY: "Do you have a history of heart failure, kidney disease, liver failure, or cancer?"     "HEART STENTS" 9. RECURRENT SYMPTOM: "Have you had leg swelling before?" If so, ask: "When was the last time?" "What happened that time?"      Yes 10. OTHER SYMPTOMS: "Do you have any other symptoms?" (e.g., chest pain, difficulty breathing)       Short of breath with  exertion 11. PREGNANCY: "Is there any chance you are pregnant?" "When was your last menstrual period?"       No  Protocols used: LEG SWELLING AND EDEMA-A-AH

## 2018-01-16 ENCOUNTER — Other Ambulatory Visit (HOSPITAL_BASED_OUTPATIENT_CLINIC_OR_DEPARTMENT_OTHER): Payer: Self-pay | Admitting: Family Medicine

## 2018-01-16 ENCOUNTER — Ambulatory Visit (INDEPENDENT_AMBULATORY_CARE_PROVIDER_SITE_OTHER): Payer: Medicare Other | Admitting: Family Medicine

## 2018-01-16 ENCOUNTER — Ambulatory Visit (HOSPITAL_BASED_OUTPATIENT_CLINIC_OR_DEPARTMENT_OTHER)
Admission: RE | Admit: 2018-01-16 | Discharge: 2018-01-16 | Disposition: A | Discharge status: Home / Self Care | Payer: Medicare Other | Source: Ambulatory Visit | Attending: Family Medicine | Admitting: Family Medicine

## 2018-01-16 ENCOUNTER — Encounter: Payer: Self-pay | Admitting: Family Medicine

## 2018-01-16 VITALS — BP 128/84 | HR 84 | Temp 98.1°F | Ht 65.0 in | Wt 237.0 lb

## 2018-01-16 DIAGNOSIS — M7989 Other specified soft tissue disorders: Secondary | ICD-10-CM | POA: Insufficient documentation

## 2018-01-16 DIAGNOSIS — R300 Dysuria: Secondary | ICD-10-CM

## 2018-01-16 LAB — BASIC METABOLIC PANEL
BUN: 13 mg/dL (ref 6–23)
CO2: 26 mEq/L (ref 19–32)
CREATININE: 0.89 mg/dL (ref 0.40–1.20)
Calcium: 8.5 mg/dL (ref 8.4–10.5)
Chloride: 106 mEq/L (ref 96–112)
GFR: 80.09 mL/min (ref >60.00)
GLUCOSE: 112 mg/dL — AB (ref 70–99)
Potassium: 3.9 mEq/L (ref 3.5–5.1)
Sodium: 141 mEq/L (ref 135–145)

## 2018-01-16 LAB — POCT URINALYSIS DIPSTICK
BILIRUBIN UA: NEGATIVE
Blood, UA: NEGATIVE
Glucose, UA: NEGATIVE
Ketones, UA: NEGATIVE
Leukocytes, UA: NEGATIVE
NITRITE UA: NEGATIVE
PH UA: 5.5 (ref 5.0–8.0)
PROTEIN UA: NEGATIVE
Spec Grav, UA: 1.025 (ref 1.010–1.025)
Urobilinogen, UA: 0.2 E.U./dL

## 2018-01-16 LAB — BRAIN NATRIURETIC PEPTIDE: Pro B Natriuretic peptide (BNP): 34 pg/mL (ref 0.0–100.0)

## 2018-01-16 LAB — TSH: TSH: 1.22 u[IU]/mL (ref 0.35–4.50)

## 2018-01-16 NOTE — Progress Notes (Signed)
-  [place] negative for DVT.  Weight appears to be up about 15 lbs since October.  I would recommend increasing her lasix to 40mg  daily and f/u with PCP next week. -UA negative for UTI

## 2018-01-16 NOTE — Progress Notes (Signed)
[name] - 72 y.o. female MRN [id number]  Date of birth: 1945/02/28  Subjective Chief Complaint  Patient presents with  . Leg Swelling    bilateral leg edema-right worse than left-ongoing for one week    HPI [name] is a 72 y.o. female with history of CAD, HTN, and AAA here today with complaint of leg swelling.  She reports that she has had R leg swelling for the past week with worsening over the past couple of days.  Swelling is painful.  She denies redness or warmth of leg.  She has felt a little more short of breath with activity but denies pleuritic type chest pain or chest tightness.  She has not had fever or chills.  She also thinks she may have a UTI as she is having some dysuria. She denies flank pain, nausea or vomiting.     ROS:  A comprehensive ROS was completed and negative except as noted per HPI  Allergies  Allergen Reactions  . Prilosec [Omeprazole] Other (See Comments)    Chest pain  . Miconazole Nitrate Hives    REACTION: hives  . Crestor [Rosuvastatin Calcium] Other (See Comments)    Feeling poor  . Augmentin [Amoxicillin-Pot Clavulanate] Hives, Itching and Rash    Has patient had a PCN reaction causing immediate rash, facial/tongue/throat swelling, SOB or lightheadedness with hypotension:unsure Has patient had a PCN reaction causing severe rash involving mucus membranes or skin necrosis:unsure Has patient had a PCN reaction that required hospitalization:No Has patient had a PCN reaction occurring within the last 10 years:NO If all of the above answers are "NO", then may proceed with Cephalosporin use. Has patient had a PCN reaction causing immediate rash, facial/tongue/throat swelling, SOB or lightheadedness with hypotension:unsure Has patient had a PCN reaction causing severe rash involving mucus membranes or skin necrosis:unsure Has patient had a PCN reaction that required hospitalization:No Has patient had a PCN reaction occurring within the last 10  years:NO If all of the above answers are "NO", then may proceed with Cephalosporin use.   [name] Doxycycline Other (See Comments)    REACTION: gi upset    Past Medical History:  Diagnosis Date  . AAA (abdominal aortic aneurysm) ([place])    a. s/p stent graft repair 01/2016.  [name] Allergic rhinitis, cause unspecified   . Anxiety state, unspecified   . Chronic LBP   . Coronary artery disease    a. inferior MI 1998 s/p PCI of RCA. b. stenting of Cx 04/2010. c. DES to prox LAD 05/2013. d. DES x 3 in 10/2015.  [name] Degeneration of lumbar or lumbosacral intervertebral disc   . Depressive disorder, not elsewhere classified   . Diabetes mellitus    TYPE II  . Gout 08/22/2013  . Hyperlipidemia   . Hypertension   . Hypothyroidism   . Lower GI bleed 06/2010   Diverticular bleed  . Noncompliance with medications 02/26/2014  . Obesity, unspecified     Past Surgical History:  Procedure Laterality Date  . CARDIAC CATHETERIZATION     PCI OF BOTH THE CIRCUMFLEX AND LEFT ANTERIOR DESCENDING ARTERY  . CARDIAC CATHETERIZATION N/A 10/29/2015   Procedure: Coronary Stent Intervention;  Surgeon: Nelva Bush, MD;  Location: [place] [facility];  Service: Cardiovascular;  Laterality: N/A;  . CARDIAC CATHETERIZATION N/A 10/29/2015   Procedure: Coronary/Graft Angiography;  Surgeon: Nelva Bush, MD;  Location: [place] [facility];  Service: Cardiovascular;  Laterality: N/A;  . CARDIAC CATHETERIZATION N/A 10/29/2015   Procedure: Intravascular  Pressure Wire/FFR Study;  Surgeon: Nelva Bush, MD;  Location: [place] [facility];  Service: Cardiovascular;  Laterality: N/A;  . CESAREAN SECTION    . ENDOVASCULAR STENT INSERTION N/A 01/22/2016   Procedure: ABDOMINAL AORTIC ENDOVASCULAR STENT GRAFT INSERTION;  Surgeon: Rosetta Posner, MD;  Location: [place];  Service: Vascular;  Laterality: N/A;  . HEART STENT  04-2010  and  Jun 07, 2013   X 3  . LEFT HEART CATHETERIZATION WITH CORONARY ANGIOGRAM N/A 06/07/2013   Procedure: LEFT  HEART CATHETERIZATION WITH CORONARY ANGIOGRAM;  Surgeon: [name], MD;  Location: [facility] LAB;  Service: Cardiovascular;  Laterality: N/A;  . LEFT HEART CATHETERIZATION WITH CORONARY ANGIOGRAM N/A 02/25/2014   Procedure: LEFT HEART CATHETERIZATION WITH CORONARY ANGIOGRAM;  Surgeon: Troy Sine, MD;  Location: [facility] CATH LAB;  Service: Cardiovascular;  Laterality: N/A;  . LUMBAR FUSION  01/2007   DR. Patrice Paradise...3-LEVEL WITH FIXATION  . OOPHORECTOMY     BSO? pt.unsure  . PARATHYROIDECTOMY    . SPINE SURGERY    . THYROIDECTOMY    . TOTAL ABDOMINAL HYSTERECTOMY      Social History   Socioeconomic History  . Marital status: Divorced    Spouse name: Not on file  . Number of children: 3  . Years of education: Not on file  . Highest education level: Not on file  Occupational History  . 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 Financial resource strain: Not on file  . Food insecurity:    Worry: Not on file    Inability: Not on file  . Transportation needs:    Medical: Not on file    Non-medical: Not on file  Tobacco Use  . Smoking status: Former Smoker    Years: 30.00    Types: Cigarettes    Last attempt to quit: 05/31/1986    Years since quitting: 31.6  . Smokeless tobacco: Never Used  Substance and Sexual Activity  . Alcohol use: No  . Drug use: No  . Sexual activity: Not Currently    Comment: 1st intercourse 72 yo-Fewer than 5 partners  Lifestyle  . Physical activity:    Days per week: Not on file    Minutes per session: Not on file  . Stress: Not on file  Relationships  . Social connections:    Talks on phone: Not on file    Gets together: Not on file    Attends religious service: Not on file    Active member of club or organization: Not on file    Attends meetings of clubs or organizations: Not on file    Relationship status: Not on file  Other Topics Concern  . Not on file    Social History Narrative   DIVORCED   3 CHILDREN   PATIENT SIGNED A DESIGNATED PARTY RELEASE TO ALLOW HER DAUGHTER, TRAMAINE Cerra, TO HAVE ACCESS TO HER MEDICAL RECORDS/INFORMATION. [name], May 04, 2009 @ 3:27 PM   Smokes cigarettes on rare occasions.    Family History  Problem Relation Age of Onset  . Heart attack Mother 63       s/p D&C-CARDIAC ARREST 1966  . Heart disease Mother   . Heart attack Father 41       1978 WITH MI  . Heart disease Father   . Diabetes Brother   . Diabetes Maternal Aunt   .  Arthritis Maternal Aunt   . Breast cancer Maternal Aunt        Post menopausal  . Colon cancer Neg Hx     Health Maintenance  Topic Date Due  . MAMMOGRAM  03/12/2015  . HEMOGLOBIN A1C  03/09/2018  . URINE MICROALBUMIN  03/09/2018  . OPHTHALMOLOGY EXAM  07/14/2018  . FOOT EXAM  09/07/2018  . COLONOSCOPY  04/12/2020  . TETANUS/TDAP  02/22/2023  . INFLUENZA VACCINE  Completed  . DEXA SCAN  Completed  . Hepatitis C Screening  Completed  . PNA vac Low Risk Adult  Completed    ----------------------------------------------------------------------------------------------------------------------------------------------------------------------------------------------------------------- Physical Exam BP 128/84   Pulse 84   Temp 98.1 F (36.7 C) (Oral)   Ht 5\' 5"  (1.651 m)   Wt 237 lb (107.5 kg)   SpO2 97%   BMI 39.44 kg/m   Physical Exam Constitutional:      Appearance: Normal appearance.  HENT:     Head: Normocephalic and atraumatic.     Mouth/Throat:     Mouth: Mucous membranes are moist.  Eyes:     General: No scleral icterus. Neck:     Musculoskeletal: Normal range of motion and neck supple.  Cardiovascular:     Rate and Rhythm: Normal rate and regular rhythm.  Pulmonary:     Effort: Pulmonary effort is normal.     Breath sounds: Normal breath sounds.  Musculoskeletal:     Right lower leg: Edema (TTP along leg without erythema or warmth. )  present.     Left lower leg: Edema (mild swelling) present.  Skin:    General: Skin is warm and dry.  Neurological:     Mental Status: She is alert.  Psychiatric:        Mood and Affect: Mood normal.     ------------------------------------------------------------------------------------------------------------------------------------------------------------------------------------------------------------------- Assessment and Plan  Dysuria -Check UA  Right leg swelling -[place] ordered to r/o DVT given unilateral edema and tenderness.  -Check bmp, tsh and BNP

## 2018-01-16 NOTE — Patient Instructions (Signed)
-  Go to [facility] [place] off [address] in [place] to have ultrasound done.

## 2018-01-16 NOTE — Assessment & Plan Note (Signed)
Check UA 

## 2018-01-16 NOTE — Assessment & Plan Note (Signed)
-  [place] ordered to r/o DVT given unilateral edema and tenderness.  -Check bmp, tsh and BNP

## 2018-01-30 ENCOUNTER — Encounter: Payer: Self-pay | Admitting: Internal Medicine

## 2018-01-30 ENCOUNTER — Ambulatory Visit (INDEPENDENT_AMBULATORY_CARE_PROVIDER_SITE_OTHER): Payer: Medicare Other | Admitting: Internal Medicine

## 2018-01-30 VITALS — BP 116/70 | HR 83 | Temp 97.8°F | Ht 65.0 in | Wt 236.0 lb

## 2018-01-30 DIAGNOSIS — E11311 Type 2 diabetes mellitus with unspecified diabetic retinopathy with macular edema: Secondary | ICD-10-CM

## 2018-01-30 DIAGNOSIS — I872 Venous insufficiency (chronic) (peripheral): Secondary | ICD-10-CM

## 2018-01-30 DIAGNOSIS — I10 Essential (primary) hypertension: Secondary | ICD-10-CM | POA: Diagnosis not present

## 2018-01-30 DIAGNOSIS — M545 Low back pain, unspecified: Secondary | ICD-10-CM

## 2018-01-30 DIAGNOSIS — M109 Gout, unspecified: Secondary | ICD-10-CM

## 2018-01-30 DIAGNOSIS — M7989 Other specified soft tissue disorders: Secondary | ICD-10-CM | POA: Diagnosis not present

## 2018-01-30 DIAGNOSIS — G4700 Insomnia, unspecified: Secondary | ICD-10-CM

## 2018-01-30 DIAGNOSIS — G8929 Other chronic pain: Secondary | ICD-10-CM

## 2018-01-30 DIAGNOSIS — Z1239 Encounter for other screening for malignant neoplasm of breast: Secondary | ICD-10-CM

## 2018-01-30 LAB — POCT GLYCOSYLATED HEMOGLOBIN (HGB A1C): Hemoglobin A1C: 6.3 % — AB (ref 4.0–5.6)

## 2018-01-30 MED ORDER — TIZANIDINE HCL 2 MG PO TABS: 2.0000 mg | ORAL_TABLET | Freq: Four times a day (QID) | ORAL | 1 refills | Status: DC | PRN

## 2018-01-30 MED ORDER — METHYLPREDNISOLONE ACETATE 80 MG/ML IJ SUSP
80.0000 mg | Freq: Once | INTRAMUSCULAR | Status: AC
  Administered 2018-01-30: 80 mg via INTRAMUSCULAR

## 2018-01-30 NOTE — Progress Notes (Signed)
Subjective:    Patient ID: [name], female    DOB: October 13, 1945, 73 y.o.   MRN: [id number]  HPI  Here to f/u; overall doing ok,  Pt denies chest pain, increasing sob or doe, wheezing, orthopnea, PND, palpitations, dizziness or syncope, except for increased right > left leg swelling for several months, BNP normal last visit.  Pt denies new neurological symptoms such as new headache, or facial or extremity weakness or numbness.  Pt denies polydipsia, polyuria, or low sugar episode.  Pt states overall good compliance with meds, mostly trying to follow appropriate diet, with wt overall stable,  but little exercise however. Wt Readings from Last 3 Encounters:  01/30/18 236 lb (107 kg)  01/16/18 237 lb (107.5 kg)  10/25/17 222 lb (100.7 kg)  Pt continues to have recurring LBP without change in severity, bowel or bladder change, fever, wt loss,  worsening LE pain/numbness/weakness, gait change or falls,, asks for toradol shot today.  Also has hx of recurrent gout, and today with bilat hand MCP joint swelilng with tenderness.  Also need mammogram, and asks for advice on OTC sleep med Past Medical History:  Diagnosis Date  . AAA (abdominal aortic aneurysm) ([place])    a. s/p stent graft repair 01/2016.  [name] Allergic rhinitis, cause unspecified   . Anxiety state, unspecified   . Chronic LBP   . Coronary artery disease    a. inferior MI 1998 s/p PCI of RCA. b. stenting of Cx 04/2010. c. DES to prox LAD 05/2013. d. DES x 3 in 10/2015.  [name] Degeneration of lumbar or lumbosacral intervertebral disc   . Depressive disorder, not elsewhere classified   . Diabetes mellitus    TYPE II  . Gout 08/22/2013  . Hyperlipidemia   . Hypertension   . Hypothyroidism   . Lower GI bleed 06/2010   Diverticular bleed  . Noncompliance with medications 02/26/2014  . Obesity, unspecified    Past Surgical History:  Procedure Laterality Date  . CARDIAC CATHETERIZATION     PCI OF BOTH THE CIRCUMFLEX AND LEFT ANTERIOR DESCENDING  ARTERY  . CARDIAC CATHETERIZATION N/A 10/29/2015   Procedure: Coronary Stent Intervention;  Surgeon: Nelva Bush, MD;  Location: [facility];  Service: Cardiovascular;  Laterality: N/A;  . CARDIAC CATHETERIZATION N/A 10/29/2015   Procedure: Coronary/Graft Angiography;  Surgeon: Nelva Bush, MD;  Location: [facility];  Service: Cardiovascular;  Laterality: N/A;  . CARDIAC CATHETERIZATION N/A 10/29/2015   Procedure: Intravascular Pressure Wire/FFR Study;  Surgeon: Nelva Bush, MD;  Location: [facility];  Service: Cardiovascular;  Laterality: N/A;  . CESAREAN SECTION    . ENDOVASCULAR STENT INSERTION N/A 01/22/2016   Procedure: ABDOMINAL AORTIC ENDOVASCULAR STENT GRAFT INSERTION;  Surgeon: Rosetta Posner, MD;  Location: [facility];  Service: Vascular;  Laterality: N/A;  . HEART STENT  04-2010  and  Jun 07, 2013   X 3  . LEFT HEART CATHETERIZATION WITH CORONARY ANGIOGRAM N/A 06/07/2013   Procedure: LEFT HEART CATHETERIZATION WITH CORONARY ANGIOGRAM;  Surgeon: [name], MD;  Location: [facility];  Service: Cardiovascular;  Laterality: N/A;  . LEFT HEART CATHETERIZATION WITH CORONARY ANGIOGRAM N/A 02/25/2014   Procedure: LEFT HEART CATHETERIZATION WITH CORONARY ANGIOGRAM;  Surgeon: Troy Sine, MD;  Location: [facility] CATH [facility];  Service: Cardiovascular;  Laterality: N/A;  . LUMBAR FUSION  01/2007   DR. Patrice Paradise...3-LEVEL WITH FIXATION  . OOPHORECTOMY     BSO? pt.unsure  . PARATHYROIDECTOMY    .  SPINE SURGERY    . THYROIDECTOMY    . TOTAL ABDOMINAL HYSTERECTOMY      reports that she quit smoking about 31 years ago. Her smoking use included cigarettes. She quit after 30.00 years of use. She has never used smokeless tobacco. She reports that she does not drink alcohol or use drugs. family history includes Arthritis in her maternal aunt; Breast cancer in her maternal aunt; Diabetes in her brother and maternal aunt; Heart attack (age of onset: 64) in her mother; Heart  attack (age of onset: 12) in her father; Heart disease in her father and mother. Allergies  Allergen Reactions  . Prilosec [Omeprazole] Other (See Comments)    Chest pain  . Miconazole Nitrate Hives    REACTION: hives  . Crestor [Rosuvastatin Calcium] Other (See Comments)    Feeling poor  . Augmentin [Amoxicillin-Pot Clavulanate] Hives, Itching and Rash    Has patient had a PCN reaction causing immediate rash, facial/tongue/throat swelling, SOB or lightheadedness with hypotension:unsure Has patient had a PCN reaction causing severe rash involving mucus membranes or skin necrosis:unsure Has patient had a PCN reaction that required hospitalization:No Has patient had a PCN reaction occurring within the last 10 years:NO If all of the above answers are "NO", then may proceed with Cephalosporin use. Has patient had a PCN reaction causing immediate rash, facial/tongue/throat swelling, SOB or lightheadedness with hypotension:unsure Has patient had a PCN reaction causing severe rash involving mucus membranes or skin necrosis:unsure Has patient had a PCN reaction that required hospitalization:No Has patient had a PCN reaction occurring within the last 10 years:NO If all of the above answers are "NO", then may proceed with Cephalosporin use.   [name] Doxycycline Other (See Comments)    REACTION: gi upset   Current Outpatient Medications on File Prior to Visit  Medication Sig Dispense Refill  . allopurinol (ZYLOPRIM) 300 MG tablet Take 1 tablet (300 mg total) by mouth daily. 90 tablet 3  . aspirin 81 MG EC tablet Take 81 mg by mouth daily.     [name] atenolol (TENORMIN) 25 MG tablet TAKE 1 TABLET(25 MG) BY MOUTH TWICE DAILY 180 tablet 0  . celecoxib (CELEBREX) 200 MG capsule as needed.    . clopidogrel (PLAVIX) 75 MG tablet Take 1 tablet (75 mg total) by mouth every evening. 90 tablet 3  . gabapentin (NEURONTIN) 300 MG capsule 1-2 tab by mouth at bedtime 180 capsule 1  . hydrochlorothiazide (MICROZIDE)  12.5 MG capsule Take 1 capsule (12.5 mg total) by mouth daily. 90 capsule 3  . lactulose (CHRONULAC) 10 GM/15ML solution Take 45 mLs (30 g total) by mouth 2 (two) times daily as needed for mild constipation. 946 mL 2  . loratadine (CLARITIN) 10 MG tablet Take 10 mg by mouth daily.    . metFORMIN (GLUCOPHAGE-XR) 500 MG 24 hr tablet TAKE 4 TABLETS BY MOUTH EVERY MORNING (Patient taking differently: Take 500 mg by mouth daily with breakfast. ) 360 tablet 3  . morphine (MS CONTIN) 15 MG 12 hr tablet Take 1 tablet (15 mg total) by mouth at bedtime. For 7 days ten stop this medication. STOP: 06/16/17. Additional order: 15 mg daily at bedtime for 7 days Start: 06/17/17 Stop: 06/23/17 3 tablet 0  . nitroGLYCERIN (NITROSTAT) 0.4 MG SL tablet Place 1 tablet (0.4 mg total) under the tongue every 5 (five) minutes as needed for chest pain. 25 tablet 3  . oxyCODONE (OXY IR/ROXICODONE) 5 MG immediate release tablet 4 (four) times daily as needed.    [name]  pantoprazole (PROTONIX) 40 MG tablet Take 40 mg by mouth daily.    [name] PLAVIX 75 MG tablet TAKE 1 TABLET BY MOUTH EVERY DAY 90 tablet 3  . SYNTHROID 150 MCG tablet TAKE 1 TABLET(150 MCG) BY MOUTH DAILY 90 tablet 0  . vitamin B-12 1000 MCG tablet Take 1 tablet (1,000 mcg total) by mouth daily.    . Vitamin D, Ergocalciferol, (DRISDOL) 50000 units CAPS capsule Take 1 capsule (50,000 Units total) by mouth every 7 (seven) days. 12 capsule 0  . zolpidem (AMBIEN) 5 MG tablet Take 1 tablet (5 mg total) by mouth at bedtime as needed for sleep. 30 tablet 5   No current facility-administered medications on file prior to visit.    Review of Systems  Constitutional: Negative for other unusual diaphoresis or sweats HENT: Negative for ear discharge or swelling Eyes: Negative for other worsening visual disturbances Respiratory: Negative for stridor or other swelling  Gastrointestinal: Negative for worsening distension or other blood Genitourinary: Negative for retention or other  urinary change Musculoskeletal: Negative for other MSK pain or swelling Skin: Negative for color change or other new lesions Neurological: Negative for worsening tremors and other numbness  Psychiatric/Behavioral: Negative for worsening agitation or other fatigue All other system neg per pt    Objective:   Physical Exam BP 116/70   Pulse 83   Temp 97.8 F (36.6 C) (Oral)   Ht 5\' 5"  (1.651 m)   Wt 236 lb (107 kg)   SpO2 95%   BMI 39.27 kg/m  VS noted,  Constitutional: Pt appears in NAD HENT: Head: NCAT.  Right Ear: External ear normal.  Left Ear: External ear normal.  Eyes: . Pupils are equal, round, and reactive to light. Conjunctivae and EOM are normal Nose: without d/c or deformity Neck: Neck supple. Gross normal ROM Cardiovascular: Normal rate and regular rhythm.   Pulmonary/Chest: Effort normal and breath sounds without rales or wheezing.  Abd:  Soft, NT, ND, + BS, no organomegaly Bilat MCPs with mild tender, dwlling diffusely Neurological: Pt is alert. At baseline orientation, motor grossly intact Skin: Skin is warm. No rashes, other new lesions, trace to 1+ bilat LE edema Psychiatric: Pt behavior is normal without agitation  No other exam findings  Lab Results  Component Value Date   WBC 6.2 09/06/2017   HGB 12.4 09/06/2017   HCT 39.4 09/06/2017   PLT 368.0 09/06/2017   GLUCOSE 112 (H) 01/16/2018   CHOL 185 09/06/2017   TRIG 108.0 09/06/2017   HDL 42.40 09/06/2017   LDLDIRECT 131.0 03/27/2015   LDLCALC 121 (H) 09/06/2017   ALT 9 09/06/2017   AST 13 09/06/2017   NA 141 01/16/2018   K 3.9 01/16/2018   CL 106 01/16/2018   CREATININE 0.89 01/16/2018   BUN 13 01/16/2018   CO2 26 01/16/2018   TSH 1.22 01/16/2018   INR 1.08 01/22/2016   HGBA1C 6.3 09/06/2017   MICROALBUR 11.8 (H) 03/09/2017   Declines other labs today including rheum labs  POCT - A1c -  POCT glycosylated hemoglobin (Hb A1C)  Order: [id number]  Status:  Final result Visible to patient:   No (Not Released) Dx:  Type 2 diabetes mellitus with retinop...   Ref Range & Units 16:30 (01/30/18) 69mo ago (09/06/17) 59mo ago (03/09/17) 60yr ago (11/15/16) 75yr ago (03/23/16) 54yr ago (03/27/15) 20yr ago (08/22/13)  Hemoglobin A1C 4.0 - 5.6 % 6.3Abnormal   6.3 R, CM 5.9 R, CM 6.4 R, CM 6.4 R, CM 6.6High  R,  CM 7.0High            Assessment & Plan:

## 2018-01-30 NOTE — Assessment & Plan Note (Signed)
For knee high compression stockings, low salt, keep legs elevated, wt loss

## 2018-01-30 NOTE — Assessment & Plan Note (Signed)
For toradol IM 30, o/w stable overall by history and exam, recent data reviewed with pt, and pt to continue medical treatment as before,  to f/u any worsening symptoms or concerns

## 2018-01-30 NOTE — Assessment & Plan Note (Signed)
stable overall by history and exam, recent data reviewed with pt, and pt to continue medical treatment as before,  to f/u any worsening symptoms or concerns  

## 2018-01-30 NOTE — Assessment & Plan Note (Signed)
For melatonin asd prn

## 2018-01-30 NOTE — Assessment & Plan Note (Addendum)
Suspect gout reurrent to hands, for toradol 30 IM and depomedrol 80 IM x 1  Note:  Total time for pt hx, exam, review of record with pt in the room, determination of diagnoses and plan for further eval and tx is > 40 min, with over 50% spent in coordination and counseling of patient including the differential dx, tx, further evaluation and other management of acute gout, DM, HTN, chronic LBP, and venous insufficiency, and insomnia

## 2018-01-30 NOTE — Patient Instructions (Addendum)
Your A1c was OK today  You had the steroid shot today. And the toradol shot for pain  Please consider wearing compressions stockiings if you are able, low salt diet, wt loss and leg elevation with sitting  Please take all new medication as prescribed - tizanidine muscle relaxer  OK to try Melatonin for sleep  Please continue all other medications as before, and refills have been done if requested.  Please have the pharmacy call with any other refills you may need.  Please keep your appointments with your specialists as you may have planned  You will be contacted regarding the referral for: Mammogram \

## 2018-02-01 ENCOUNTER — Encounter: Payer: Self-pay | Admitting: Cardiovascular Disease

## 2018-02-01 ENCOUNTER — Ambulatory Visit (INDEPENDENT_AMBULATORY_CARE_PROVIDER_SITE_OTHER): Payer: Medicare Other | Admitting: Cardiovascular Disease

## 2018-02-01 VITALS — BP 118/76 | HR 72 | Ht 65.0 in | Wt 235.4 lb

## 2018-02-01 DIAGNOSIS — E782 Mixed hyperlipidemia: Secondary | ICD-10-CM | POA: Diagnosis not present

## 2018-02-01 DIAGNOSIS — I10 Essential (primary) hypertension: Secondary | ICD-10-CM

## 2018-02-01 DIAGNOSIS — R609 Edema, unspecified: Secondary | ICD-10-CM

## 2018-02-01 DIAGNOSIS — I25119 Atherosclerotic heart disease of native coronary artery with unspecified angina pectoris: Secondary | ICD-10-CM

## 2018-02-01 NOTE — Progress Notes (Signed)
Cardiology Office Note:    Date:  02/02/2018   ID:  [name], DOB August 04, 1945, MRN [id number]  PCP:  Biagio Borg, MD  Cardiologist:  Sherren Mocha, MD  Electrophysiologist:  None   Referring MD: Biagio Borg, MD   Chief Complaint  Patient presents with  . Coronary Artery Disease    History of Present Illness:    [name] is a 73 y.o. female with a hx of coronary artery disease initially presented with an inferior infarct in 1998 with stenting of the RCA.  She underwent stenting of the left circumflex in 2012 and stenting of the LAD in 2015 and again in 2017.  The patient also has an abdominal aortic aneurysm status post stent graft repair in 2018.  Comorbid medical problems include type 2 diabetes, hypertension, and hyperlipidemia.  The patient is here alone today.  She is actually been feeling quite well.  Denies recent problems with chest pain or pressure, dyspnea, orthopnea, or PND.  She has complained of right leg swelling without pain.  She describes a heaviness in the leg.  She is undergone extensive evaluation including venous duplex scanning which demonstrated no evidence of DVT.  She has taken diuretics with modest improvement.  She has had mild swelling of the left leg but it has been more pronounced on the right.  She is not able to get compression stockings on because of her back problems. Past Medical History:  Diagnosis Date  . AAA (abdominal aortic aneurysm) ([place])    a. s/p stent graft repair 01/2016.  [name] Allergic rhinitis, cause unspecified   . Anxiety state, unspecified   . Chronic LBP   . Coronary artery disease    a. inferior MI 1998 s/p PCI of RCA. b. stenting of Cx 04/2010. c. DES to prox LAD 05/2013. d. DES x 3 in 10/2015.  [name] Degeneration of lumbar or lumbosacral intervertebral disc   . Depressive disorder, not elsewhere classified   . Diabetes mellitus    TYPE II  . Gout 08/22/2013  . Hyperlipidemia   . Hypertension   . Hypothyroidism   . Lower GI  bleed 06/2010   Diverticular bleed  . Noncompliance with medications 02/26/2014  . Obesity, unspecified     Past Surgical History:  Procedure Laterality Date  . CARDIAC CATHETERIZATION     PCI OF BOTH THE CIRCUMFLEX AND LEFT ANTERIOR DESCENDING ARTERY  . CARDIAC CATHETERIZATION N/A 10/29/2015   Procedure: Coronary Stent Intervention;  Surgeon: Nelva Bush, MD;  Location: [facility];  Service: Cardiovascular;  Laterality: N/A;  . CARDIAC CATHETERIZATION N/A 10/29/2015   Procedure: Coronary/Graft Angiography;  Surgeon: Nelva Bush, MD;  Location: [facility];  Service: Cardiovascular;  Laterality: N/A;  . CARDIAC CATHETERIZATION N/A 10/29/2015   Procedure: Intravascular Pressure Wire/FFR Study;  Surgeon: Nelva Bush, MD;  Location: [facility];  Service: Cardiovascular;  Laterality: N/A;  . CESAREAN SECTION    . ENDOVASCULAR STENT INSERTION N/A 01/22/2016   Procedure: ABDOMINAL AORTIC ENDOVASCULAR STENT GRAFT INSERTION;  Surgeon: Rosetta Posner, MD;  Location: [facility];  Service: Vascular;  Laterality: N/A;  . HEART STENT  04-2010  and  Jun 07, 2013   X 3  . LEFT HEART CATHETERIZATION WITH CORONARY ANGIOGRAM N/A 06/07/2013   Procedure: LEFT HEART CATHETERIZATION WITH CORONARY ANGIOGRAM;  Surgeon: [name], MD;  Location: [facility];  Service: Cardiovascular;  Laterality: N/A;  . LEFT HEART CATHETERIZATION WITH CORONARY ANGIOGRAM N/A  02/25/2014   Procedure: LEFT HEART CATHETERIZATION WITH CORONARY ANGIOGRAM;  Surgeon: Troy Sine, MD;  Location: [facility] C[facility];  Service: Cardiovascular;  Laterality: N/A;  . LUMBAR FUSION  01/2007   DR. Patrice Paradise...3-LEVEL WITH FIXATION  . OOPHORECTOMY     BSO? pt.unsure  . PARATHYROIDECTOMY    . SPINE SURGERY    . THYROIDECTOMY    . TOTAL ABDOMINAL HYSTERECTOMY      Current Medications: Current Meds  Medication Sig  . allopurinol (ZYLOPRIM) 300 MG tablet Take 1 tablet (300 mg total) by mouth daily.  [name] aspirin 81 MG  EC tablet Take 81 mg by mouth daily.   [name] atenolol (TENORMIN) 25 MG tablet TAKE 1 TABLET(25 MG) BY MOUTH TWICE DAILY  . celecoxib (CELEBREX) 200 MG capsule as needed.  . gabapentin (NEURONTIN) 300 MG capsule 1-2 tab by mouth at bedtime  . hydrochlorothiazide (MICROZIDE) 12.5 MG capsule Take 1 capsule (12.5 mg total) by mouth daily.  [name] lactulose (CHRONULAC) 10 GM/15ML solution Take 45 mLs (30 g total) by mouth 2 (two) times daily as needed for mild constipation.  [name] loratadine (CLARITIN) 10 MG tablet Take 10 mg by mouth daily.  . metFORMIN (GLUCOPHAGE) 500 MG tablet Take 1,000 mg by mouth daily.  . metFORMIN (GLUCOPHAGE-XR) 500 MG 24 hr tablet TAKE 4 TABLETS BY MOUTH EVERY MORNING  . morphine (MS CONTIN) 15 MG 12 hr tablet Take 1 tablet (15 mg total) by mouth at bedtime. For 7 days ten stop this medication. STOP: 06/16/17. Additional order: 15 mg daily at bedtime for 7 days Start: 06/17/17 Stop: 06/23/17  . nitroGLYCERIN (NITROSTAT) 0.4 MG SL tablet Place 1 tablet (0.4 mg total) under the tongue every 5 (five) minutes as needed for chest pain.  [name] oxyCODONE (OXY IR/ROXICODONE) 5 MG immediate release tablet 4 (four) times daily as needed.  . pantoprazole (PROTONIX) 40 MG tablet Take 40 mg by mouth daily.  [name] PLAVIX 75 MG tablet TAKE 1 TABLET BY MOUTH EVERY DAY  . SYNTHROID 150 MCG tablet TAKE 1 TABLET(150 MCG) BY MOUTH DAILY  . tiZANidine (ZANAFLEX) 2 MG tablet Take 1 tablet (2 mg total) by mouth every 6 (six) hours as needed for muscle spasms.  . vitamin B-12 1000 MCG tablet Take 1 tablet (1,000 mcg total) by mouth daily.  . Vitamin D, Ergocalciferol, (DRISDOL) 50000 units CAPS capsule Take 1 capsule (50,000 Units total) by mouth every 7 (seven) days.  [name] zolpidem (AMBIEN) 5 MG tablet Take 1 tablet (5 mg total) by mouth at bedtime as needed for sleep.     Allergies:   Prilosec [omeprazole]; Miconazole nitrate; Crestor [rosuvastatin calcium]; Augmentin [amoxicillin-pot clavulanate]; and Doxycycline    Social History   Socioeconomic History  . Marital status: Divorced    Spouse name: Not on file  . Number of children: 3  . Years of education: Not on file  . Highest education level: Not on file  Occupational History  . 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 Financial resource strain: Not on file  . Food insecurity:    Worry: Not on file    Inability: Not on file  . Transportation needs:    Medical: Not on file    Non-medical: Not on file  Tobacco Use  . Smoking status: Former Smoker    Years: 30.00    Types: Cigarettes    Last attempt to  quit: 05/31/1986    Years since quitting: 31.6  . Smokeless tobacco: Never Used  Substance and Sexual Activity  . Alcohol use: No  . Drug use: No  . Sexual activity: Not Currently    Comment: 1st intercourse 73 yo-Fewer than 5 partners  Lifestyle  . Physical activity:    Days per week: Not on file    Minutes per session: Not on file  . Stress: Not on file  Relationships  . Social connections:    Talks on phone: Not on file    Gets together: Not on file    Attends religious service: Not on file    Active member of club or organization: Not on file    Attends meetings of clubs or organizations: Not on file    Relationship status: Not on file  Other Topics Concern  . Not on file  Social History Narrative   DIVORCED   3 CHILDREN   PATIENT SIGNED A DESIGNATED PARTY RELEASE TO ALLOW HER DAUGHTER, TRAMAINE Schowalter, TO HAVE ACCESS TO HER MEDICAL RECORDS/INFORMATION. [name], May 04, 2009 @ 3:27 PM   Smokes cigarettes on rare occasions.     Family History: The patient's family history includes Arthritis in her maternal aunt; Breast cancer in her maternal aunt; Diabetes in her brother and maternal aunt; Heart attack (age of onset: 29) in her mother; Heart attack (age of onset: 38) in her father; Heart disease in her father and mother. There is no  history of Colon cancer.  ROS:   Please see the history of present illness.    Positive for excessive sweating, fatigue.  All other systems reviewed and are negative.  EKGs/Labs/Other Studies Reviewed:    The following studies were reviewed today: Echo 06/13/2017: Study Conclusions  - Left ventricle: The cavity size was normal. Systolic function was   normal. The estimated ejection fraction was in the range of 55%   to 60%. Wall motion was normal; there were no regional wall   motion abnormalities. Doppler parameters are consistent with   abnormal left ventricular relaxation (grade 1 diastolic   dysfunction). Doppler parameters are consistent with   indeterminate ventricular filling pressure. - Aortic valve: Transvalvular velocity was within the normal range.   There was no stenosis. There was no regurgitation. Valve area   (VTI): 3.25 cm^2. Valve area (Vmax): 3.08 cm^2. Valve area   (Vmean): 3.16 cm^2. - Mitral valve: Transvalvular velocity was within the normal range.   There was no evidence for stenosis. There was no regurgitation.   Valve area by pressure half-time: 1.06 cm^2. - Left atrium: The atrium was moderately dilated. - Right ventricle: The cavity size was normal. Wall thickness was   normal. Systolic function was normal. - Tricuspid valve: There was trivial regurgitation. - Pulmonary arteries: Systolic pressure was within the normal   range. PA peak pressure: 20 mm Hg (S). - Pericardium, extracardiac: A trivial pericardial effusion was   identified.  Cardiac Cath 10/29/2015: Conclusion   Conclusions: 1. Multivessel coronary artery disease, including 60% ostial and 70% proximal/mid RCA disease that is hemodynamically significant by FFR (0.71).  Mild in-stent restenosis of LAD stents, as well as 60% stenosis in the mid LCx adjacent to previously stented large OM are also present. 2. Successful FFR-guided PCI of the RCA from the ostium to the midvessel with three  overlapping Promus Premier stents post-dilated to 3.25 mm with 0% residual stenosis and TIMI-3 flow. 3. Patient had significant pain chest pain beginning with FFR  and continuing throughout intervention despite aggressive sedation and analgesia.  No clear angiographic or electrocardiographic etiology was identified.  Pain had returned to pre-cath level by the end of the procedure.  Recommendations: 1. Transfer to [facility] for aggressive medical management of chest pain, including weaning of NTG infusion as tolerated. 2. Dual antiplatelet therapy with aspirin and ticagrelor for at least 12 months, ideally longer. 3. Aggressive secondary prevention. 4. If patient continues to have chest pain, consider repeat catheterization with FFR and/or PCI to mid LCx. 5. Due to marked tortuosity of the innominate/right subclavian artery, consider alternate vascular access for further cardiac catheterizations.   Diagnostic  Dominance: Right    Intervention      EKG:  EKG is not ordered today.    Recent Labs: 06/11/2017: B Natriuretic Peptide 110.3 09/06/2017: ALT 9; Hemoglobin 12.4; Platelets 368.0 01/16/2018: BUN 13; Creatinine, Ser 0.89; Potassium 3.9; Pro B Natriuretic peptide (BNP) 34.0; Sodium 141; TSH 1.22   Recent Lipid Panel    Component Value Date/Time   CHOL 185 09/06/2017 1427   CHOL 181 11/30/2016 1053   TRIG 108.0 09/06/2017 1427   TRIG 102 01/02/2006 0825   HDL 42.40 09/06/2017 1427   HDL 29 (L) 11/30/2016 1053   CHOLHDL 4 09/06/2017 1427   VLDL 21.6 09/06/2017 1427   LDLCALC 121 (H) 09/06/2017 1427   LDLCALC 123 (H) 11/30/2016 1053   LDLDIRECT 131.0 03/27/2015 1137    Physical Exam:    VS:  BP 118/76   Pulse 72   Ht 5\' 5"  (1.651 m)   Wt 235 lb 6.4 oz (106.8 kg)   SpO2 95%   BMI 39.17 kg/m     Wt Readings from Last 3 Encounters:  02/01/18 235 lb 6.4 oz (106.8 kg)  01/30/18 236 lb (107 kg)  01/16/18 237 lb (107.5 kg)     GEN:  Well nourished, well developed in no  acute distress HEENT: Normal NECK: No JVD; No carotid bruits LYMPHATICS: No lymphadenopathy CARDIAC: RRR, no murmurs, rubs, gallops RESPIRATORY:  Clear to auscultation without rales, wheezing or rhonchi  ABDOMEN: Soft, non-tender, non-distended MUSCULOSKELETAL:  1+ edema right ankle and foot edema; No deformity  SKIN: Warm and dry NEUROLOGIC:  Alert and oriented x 3 PSYCHIATRIC:  Normal affect   ASSESSMENT:    1. Edema, unspecified type   2. Coronary artery disease involving native coronary artery of native heart with angina pectoris ([place])   3. Mixed hyperlipidemia   4. Essential hypertension    PLAN:    In order of problems listed above:  1. We discussed potential etiologies of her edema.  She does have some prominent veins in both legs and may have venous insufficiency.  Diastolic heart failure is another possibility and I reviewed her most recent echocardiogram.  Will update an echo study since her edema has been more pronounced over the last 6 months.  We discussed leg elevation techniques.  Compression is not a good option as she is unable to place any kind of compression stocking because of her back problems and limited mobility. I offered a referral to vascular surgery but she declined.  2. The patient's anginal symptoms appear stable.  She has had no recent chest discomfort and has not taken nitroglycerin in the last 3 months.  She will continue on aspirin and clopidogrel.  She is treated with a beta-blocker. 3. Has been on and off of statin drugs, noncompliant with various side effects.  4. BP well controlled currently.   Medication  Adjustments/Labs and Tests Ordered: Current medicines are reviewed at length with the patient today.  Concerns regarding medicines are outlined above.  Orders Placed This Encounter  Procedures  . ECHOCARDIOGRAM COMPLETE   No orders of the defined types were placed in this encounter.   Patient Instructions  Medication Instructions:  Your  provider recommends that you continue on your current medications as directed. Please refer to the Current Medication list given to you today.    Labwork: None  Testing/Procedures: Your provider has requested that you have an echocardiogram. Echocardiography is a painless test that uses sound waves to create images of your heart. It provides your doctor with information about the size and shape of your heart and how well your heart's chambers and valves are working. This procedure takes approximately one hour. There are no restrictions for this procedure. You are scheduled February 09, 2018 for your echo. Please arrive by 12:30PM.  Follow-Up: You have a follow-up appointment with Richardson Dopp, PA on August 03, 2018 at 10:45AM.      Signed, Sherren Mocha, MD  02/02/2018 6:34 AM    [place]

## 2018-02-01 NOTE — Patient Instructions (Addendum)
Medication Instructions:  Your provider recommends that you continue on your current medications as directed. Please refer to the Current Medication list given to you today.    Labwork: None  Testing/Procedures: Your provider has requested that you have an echocardiogram. Echocardiography is a painless test that uses sound waves to create images of your heart. It provides your doctor with information about the size and shape of your heart and how well your heart's chambers and valves are working. This procedure takes approximately one hour. There are no restrictions for this procedure. You are scheduled February 09, 2018 for your echo. Please arrive by 12:30PM.  Follow-Up: You have a follow-up appointment with Richardson Dopp, PA on August 03, 2018 at 10:45AM.

## 2018-02-09 ENCOUNTER — Other Ambulatory Visit: Payer: Self-pay

## 2018-02-09 ENCOUNTER — Ambulatory Visit (HOSPITAL_COMMUNITY): Payer: Medicare Other | Attending: Cardiovascular Disease

## 2018-02-09 DIAGNOSIS — R609 Edema, unspecified: Secondary | ICD-10-CM | POA: Insufficient documentation

## 2018-02-09 DIAGNOSIS — I071 Rheumatic tricuspid insufficiency: Secondary | ICD-10-CM | POA: Insufficient documentation

## 2018-02-09 DIAGNOSIS — E785 Hyperlipidemia, unspecified: Secondary | ICD-10-CM | POA: Diagnosis not present

## 2018-02-09 DIAGNOSIS — I739 Peripheral vascular disease, unspecified: Secondary | ICD-10-CM | POA: Diagnosis not present

## 2018-02-09 DIAGNOSIS — I2510 Atherosclerotic heart disease of native coronary artery without angina pectoris: Secondary | ICD-10-CM | POA: Insufficient documentation

## 2018-02-09 DIAGNOSIS — I10 Essential (primary) hypertension: Secondary | ICD-10-CM | POA: Diagnosis not present

## 2018-03-09 ENCOUNTER — Ambulatory Visit: Payer: Medicare Other | Admitting: Internal Medicine

## 2018-03-11 ENCOUNTER — Other Ambulatory Visit: Payer: Self-pay | Admitting: Internal Medicine

## 2018-03-12 DIAGNOSIS — H43391 Other vitreous opacities, right eye: Secondary | ICD-10-CM | POA: Diagnosis not present

## 2018-03-13 ENCOUNTER — Ambulatory Visit (INDEPENDENT_AMBULATORY_CARE_PROVIDER_SITE_OTHER): Payer: Medicare Other | Admitting: Internal Medicine

## 2018-03-13 ENCOUNTER — Encounter: Payer: Self-pay | Admitting: Internal Medicine

## 2018-03-13 VITALS — BP 114/66 | HR 89 | Temp 97.7°F | Ht 65.0 in | Wt 236.0 lb

## 2018-03-13 DIAGNOSIS — G8929 Other chronic pain: Secondary | ICD-10-CM | POA: Diagnosis not present

## 2018-03-13 DIAGNOSIS — M545 Low back pain, unspecified: Secondary | ICD-10-CM

## 2018-03-13 DIAGNOSIS — I10 Essential (primary) hypertension: Secondary | ICD-10-CM | POA: Diagnosis not present

## 2018-03-13 DIAGNOSIS — D509 Iron deficiency anemia, unspecified: Secondary | ICD-10-CM | POA: Diagnosis not present

## 2018-03-13 DIAGNOSIS — E11311 Type 2 diabetes mellitus with unspecified diabetic retinopathy with macular edema: Secondary | ICD-10-CM | POA: Diagnosis not present

## 2018-03-13 DIAGNOSIS — J22 Unspecified acute lower respiratory infection: Secondary | ICD-10-CM

## 2018-03-13 DIAGNOSIS — Z0000 Encounter for general adult medical examination without abnormal findings: Secondary | ICD-10-CM

## 2018-03-13 DIAGNOSIS — E782 Mixed hyperlipidemia: Secondary | ICD-10-CM

## 2018-03-13 MED ORDER — AZITHROMYCIN 250 MG PO TABS: ORAL_TABLET | ORAL | 0 refills | Status: DC

## 2018-03-13 MED ORDER — ATORVASTATIN CALCIUM 20 MG PO TABS: 20.0000 mg | ORAL_TABLET | Freq: Every day | ORAL | 3 refills | Status: DC

## 2018-03-13 MED ORDER — TIZANIDINE HCL 2 MG PO TABS: 2.0000 mg | ORAL_TABLET | Freq: Four times a day (QID) | ORAL | 1 refills | Status: DC | PRN

## 2018-03-13 MED ORDER — KETOROLAC TROMETHAMINE 30 MG/ML IJ SOLN
30.0000 mg | Freq: Once | INTRAMUSCULAR | Status: AC
  Administered 2018-03-13: 30 mg via INTRAMUSCULAR

## 2018-03-13 NOTE — Assessment & Plan Note (Signed)
stable overall by history and exam, recent data reviewed with pt, and pt to continue medical treatment as before,  to f/u any worsening symptoms or concerns  

## 2018-03-13 NOTE — Assessment & Plan Note (Signed)
Mild to mod, for antibx course,  to f/u any worsening symptoms or concerns 

## 2018-03-13 NOTE — Progress Notes (Signed)
Subjective:    Patient ID: [name], female    DOB: [birth year], 73 y.o.   MRN: [id number]  HPI  Here to f/u; overall doing ok,  Pt denies chest pain, increasing sob or doe, wheezing, orthopnea, PND, increased LE swelling, palpitations, dizziness or syncope.  Pt denies new neurological symptoms such as new headache, or facial or extremity weakness or numbness.  Pt denies polydipsia, polyuria, or low sugar episode.  Pt states overall good compliance with meds, mostly trying to follow appropriate diet, with wt overall stable,  but little exercise however.  Not currently on statin. BP Readings from Last 3 Encounters:  03/13/18 114/66  02/01/18 118/76  01/30/18 116/70   Wt Readings from Last 3 Encounters:  03/13/18 236 lb (107 kg)  02/01/18 235 lb 6.4 oz (106.8 kg)  01/30/18 236 lb (107 kg)  Has persistent RLE swelling no change. Not taking the iron since last visit, no overt bleeding.  Also  Here with 2-3 days acute onset fever, facial pain, pressure, headache, general weakness and malaise, and greenish d/c, with mild ST and cough, Pt continues to have recurring LBP without change in severity, bowel or bladder change, fever, wt loss,  worsening LE pain/numbness/weakness, gait change or falls. Past Medical History:  Diagnosis Date  . AAA (abdominal aortic aneurysm) ([place])    a. s/p stent graft repair 01/2016.  [name] Allergic rhinitis, cause unspecified   . Anxiety state, unspecified   . Chronic LBP   . Coronary artery disease    a. inferior MI 1998 s/p PCI of RCA. b. stenting of Cx 04/2010. c. DES to prox LAD 05/2013. d. DES x 3 in 10/2015.  [name] Degeneration of lumbar or lumbosacral intervertebral disc   . Depressive disorder, not elsewhere classified   . Diabetes mellitus    TYPE II  . Gout 08/22/2013  . Hyperlipidemia   . Hypertension   . Hypothyroidism   . Lower GI bleed 06/2010   Diverticular bleed  . Noncompliance with medications 02/26/2014  . Obesity, unspecified    Past Surgical  History:  Procedure Laterality Date  . CARDIAC CATHETERIZATION     PCI OF BOTH THE CIRCUMFLEX AND LEFT ANTERIOR DESCENDING ARTERY  . CARDIAC CATHETERIZATION N/A 10/29/2015   Procedure: Coronary Stent Intervention;  Surgeon: Nelva Bush, MD;  Location: [facility];  Service: Cardiovascular;  Laterality: N/A;  . CARDIAC CATHETERIZATION N/A 10/29/2015   Procedure: Coronary/Graft Angiography;  Surgeon: Nelva Bush, MD;  Location: [facility];  Service: Cardiovascular;  Laterality: N/A;  . CARDIAC CATHETERIZATION N/A 10/29/2015   Procedure: Intravascular Pressure Wire/FFR Study;  Surgeon: Nelva Bush, MD;  Location: [facility];  Service: Cardiovascular;  Laterality: N/A;  . CESAREAN SECTION    . ENDOVASCULAR STENT INSERTION N/A 01/22/2016   Procedure: ABDOMINAL AORTIC ENDOVASCULAR STENT GRAFT INSERTION;  Surgeon: Rosetta Posner, MD;  Location: [facility];  Service: Vascular;  Laterality: N/A;  . HEART STENT  04-2010  and  Jun 07, 2013   X 3  . LEFT HEART CATHETERIZATION WITH CORONARY ANGIOGRAM N/A 06/07/2013   Procedure: LEFT HEART CATHETERIZATION WITH CORONARY ANGIOGRAM;  Surgeon: [name], MD;  Location: [facility];  Service: Cardiovascular;  Laterality: N/A;  . LEFT HEART CATHETERIZATION WITH CORONARY ANGIOGRAM N/A 02/25/2014   Procedure: LEFT HEART CATHETERIZATION WITH CORONARY ANGIOGRAM;  Surgeon: Troy Sine, MD;  Location: [facility];  Service: Cardiovascular;  Laterality: N/A;  . LUMBAR FUSION  01/2007  DR. Patrice Paradise...3-LEVEL WITH FIXATION  . OOPHORECTOMY     BSO? pt.unsure  . PARATHYROIDECTOMY    . SPINE SURGERY    . THYROIDECTOMY    . TOTAL ABDOMINAL HYSTERECTOMY      reports that she quit smoking about 31 years ago. Her smoking use included cigarettes. She quit after 30.00 years of use. She has never used smokeless tobacco. She reports that she does not drink alcohol or use drugs. family history includes Arthritis in her maternal aunt; Breast  cancer in her maternal aunt; Diabetes in her brother and maternal aunt; Heart attack (age of onset: 59) in her mother; Heart attack (age of onset: 49) in her father; Heart disease in her father and mother. Allergies  Allergen Reactions  . Prilosec [Omeprazole] Other (See Comments)    Chest pain  . Miconazole Nitrate Hives    REACTION: hives  . Crestor [Rosuvastatin Calcium] Other (See Comments)    Feeling poor  . Augmentin [Amoxicillin-Pot Clavulanate] Hives, Itching and Rash    Has patient had a PCN reaction causing immediate rash, facial/tongue/throat swelling, SOB or lightheadedness with hypotension:unsure Has patient had a PCN reaction causing severe rash involving mucus membranes or skin necrosis:unsure Has patient had a PCN reaction that required hospitalization:No Has patient had a PCN reaction occurring within the last 10 years:NO If all of the above answers are "NO", then may proceed with Cephalosporin use. Has patient had a PCN reaction causing immediate rash, facial/tongue/throat swelling, SOB or lightheadedness with hypotension:unsure Has patient had a PCN reaction causing severe rash involving mucus membranes or skin necrosis:unsure Has patient had a PCN reaction that required hospitalization:No Has patient had a PCN reaction occurring within the last 10 years:NO If all of the above answers are "NO", then may proceed with Cephalosporin use.   [name] Doxycycline Other (See Comments)    REACTION: gi upset   Current Outpatient Medications on File Prior to Visit  Medication Sig Dispense Refill  . allopurinol (ZYLOPRIM) 300 MG tablet Take 1 tablet (300 mg total) by mouth daily. 90 tablet 3  . aspirin 81 MG EC tablet Take 81 mg by mouth daily.     [name] atenolol (TENORMIN) 25 MG tablet TAKE 1 TABLET(25 MG) BY MOUTH TWICE DAILY 180 tablet 0  . celecoxib (CELEBREX) 200 MG capsule as needed.    . gabapentin (NEURONTIN) 300 MG capsule 1-2 tab by mouth at bedtime 180 capsule 1  .  hydrochlorothiazide (MICROZIDE) 12.5 MG capsule Take 1 capsule (12.5 mg total) by mouth daily. 90 capsule 3  . lactulose (CHRONULAC) 10 GM/15ML solution Take 45 mLs (30 g total) by mouth 2 (two) times daily as needed for mild constipation. 946 mL 2  . loratadine (CLARITIN) 10 MG tablet Take 10 mg by mouth daily.    . metFORMIN (GLUCOPHAGE) 500 MG tablet Take 1,000 mg by mouth daily.    . metFORMIN (GLUCOPHAGE-XR) 500 MG 24 hr tablet TAKE 4 TABLETS BY MOUTH EVERY MORNING 360 tablet 3  . morphine (MS CONTIN) 15 MG 12 hr tablet Take 1 tablet (15 mg total) by mouth at bedtime. For 7 days ten stop this medication. STOP: 06/16/17. Additional order: 15 mg daily at bedtime for 7 days Start: 06/17/17 Stop: 06/23/17 3 tablet 0  . nitroGLYCERIN (NITROSTAT) 0.4 MG SL tablet Place 1 tablet (0.4 mg total) under the tongue every 5 (five) minutes as needed for chest pain. 25 tablet 3  . oxyCODONE (OXY IR/ROXICODONE) 5 MG immediate release tablet 4 (four) times daily  as needed.    . pantoprazole (PROTONIX) 40 MG tablet Take 40 mg by mouth daily.    [name] PLAVIX 75 MG tablet TAKE 1 TABLET BY MOUTH EVERY DAY 90 tablet 3  . SYNTHROID 150 MCG tablet TAKE 1 TABLET(150 MCG) BY MOUTH DAILY 90 tablet 0  . vitamin B-12 1000 MCG tablet Take 1 tablet (1,000 mcg total) by mouth daily.    . Vitamin D, Ergocalciferol, (DRISDOL) 50000 units CAPS capsule Take 1 capsule (50,000 Units total) by mouth every 7 (seven) days. 12 capsule 0  . zolpidem (AMBIEN) 5 MG tablet Take 1 tablet (5 mg total) by mouth at bedtime as needed for sleep. 30 tablet 5   No current facility-administered medications on file prior to visit.    Review of Systems  Constitutional: Negative for other unusual diaphoresis or sweats HENT: Negative for ear discharge or swelling Eyes: Negative for other worsening visual disturbances Respiratory: Negative for stridor or other swelling  Gastrointestinal: Negative for worsening distension or other blood Genitourinary:  Negative for retention or other urinary change Musculoskeletal: Negative for other MSK pain or swelling Skin: Negative for color change or other new lesions Neurological: Negative for worsening tremors and other numbness  Psychiatric/Behavioral: Negative for worsening agitation or other fatigue     Objective:   Physical Exam BP 114/66   Pulse 89   Temp 97.7 F (36.5 C) (Oral)   Ht 5\' 5"  (1.651 m)   Wt 236 lb (107 kg)   SpO2 93%   BMI 39.27 kg/m  VS noted, mild ill Constitutional: Pt appears in NAD HENT: Head: NCAT.  Right Ear: External ear normal.  Left Ear: External ear normal.  Eyes: . Pupils are equal, round, and reactive to light. Conjunctivae and EOM are normal Bilat tm's with mild erythema.  Max sinus areas non tender.  Pharynx with mild erythema, no exudate Nose: without d/c or deformity Neck: Neck supple. Gross normal ROM Cardiovascular: Normal rate and regular rhythm.   Pulmonary/Chest: Effort normal and breath sounds without rales or wheezing.  Spine with diffuse midline lumbar tender Neurological: Pt is alert. At baseline orientation, motor grossly intact Skin: Skin is warm. No rashes, other new lesions, no LE edema Psychiatric: Pt behavior is normal without agitation  No other exam findings  Lab Results  Component Value Date   WBC 6.2 09/06/2017   HGB 12.4 09/06/2017   HCT 39.4 09/06/2017   PLT 368.0 09/06/2017   GLUCOSE 112 (H) 01/16/2018   CHOL 185 09/06/2017   TRIG 108.0 09/06/2017   HDL 42.40 09/06/2017   LDLDIRECT 131.0 03/27/2015   LDLCALC 121 (H) 09/06/2017   ALT 9 09/06/2017   AST 13 09/06/2017   NA 141 01/16/2018   K 3.9 01/16/2018   CL 106 01/16/2018   CREATININE 0.89 01/16/2018   BUN 13 01/16/2018   CO2 26 01/16/2018   TSH 1.22 01/16/2018   INR 1.08 01/22/2016   HGBA1C 6.3 (A) 01/30/2018   MICROALBUR 11.8 (H) 03/09/2017  Declines further lab today      Assessment & Plan:

## 2018-03-13 NOTE — Assessment & Plan Note (Signed)
For lower chol diet, has been intolerant of crestor previously, ok to try lipitor 20 qd

## 2018-03-13 NOTE — Patient Instructions (Signed)
You had the pain shot today (toradol)  Please take all new medication as prescribed - the antibiotic, and lipitor 20 mg per day  Please continue all other medications as before, and refills have been done if requested - the tizanidine for muscle relaxer  Please have the pharmacy call with any other refills you may need.  Please continue your efforts at being more active, low cholesterol diet, and weight control.  You are otherwise up to date with prevention measures today.  Please keep your appointments with your specialists as you may have planned  No further lab work needed today  Please return in 6 months, or sooner if needed, with Lab testing done 3-5 days before

## 2018-03-13 NOTE — Assessment & Plan Note (Signed)
Moderate persistent, for toradol 30 mg IM x 1

## 2018-03-13 NOTE — Assessment & Plan Note (Addendum)
Counseled to restart the iron sulfate 325 qd, f/u lab next visit as declines today

## 2018-03-20 ENCOUNTER — Other Ambulatory Visit: Payer: Self-pay

## 2018-03-20 DIAGNOSIS — I714 Abdominal aortic aneurysm, without rupture, unspecified: Secondary | ICD-10-CM

## 2018-03-21 ENCOUNTER — Ambulatory Visit
Admission: RE | Admit: 2018-03-21 | Discharge: 2018-03-21 | Disposition: A | Discharge status: Home / Self Care | Payer: Medicare Other | Source: Ambulatory Visit | Attending: Internal Medicine | Admitting: Internal Medicine

## 2018-03-21 ENCOUNTER — Other Ambulatory Visit: Payer: Self-pay | Admitting: Internal Medicine

## 2018-03-21 DIAGNOSIS — Z1239 Encounter for other screening for malignant neoplasm of breast: Secondary | ICD-10-CM

## 2018-03-21 DIAGNOSIS — Z1231 Encounter for screening mammogram for malignant neoplasm of breast: Secondary | ICD-10-CM | POA: Diagnosis not present

## 2018-03-27 ENCOUNTER — Ambulatory Visit: Payer: Medicare Other | Admitting: Vascular Surgery

## 2018-03-27 ENCOUNTER — Ambulatory Visit (HOSPITAL_COMMUNITY): Payer: Medicare Other

## 2018-04-17 ENCOUNTER — Ambulatory Visit: Payer: Medicare Other | Admitting: Vascular Surgery

## 2018-04-17 ENCOUNTER — Other Ambulatory Visit (HOSPITAL_COMMUNITY): Payer: Medicare Other

## 2018-06-15 ENCOUNTER — Other Ambulatory Visit: Payer: Self-pay

## 2018-06-15 MED ORDER — ATENOLOL 25 MG PO TABS: ORAL_TABLET | ORAL | 1 refills | Status: DC

## 2018-07-02 ENCOUNTER — Telehealth (HOSPITAL_COMMUNITY): Payer: Self-pay

## 2018-07-02 NOTE — Telephone Encounter (Signed)
The above patient or their representative was contacted and gave the following answers to these questions:         Do you have any of the following symptoms?  NO  Fever                    Cough                   Shortness of breath  Do  you have any of the following other symptoms?    muscle pain         vomiting,        diarrhea        rash         weakness        red eye        abdominal pain         bruising          bruising or bleeding              joint pain           severe headache    Have you been in contact with someone who was or has been sick in the past 2 weeks?  NO  Yes                 Unsure                         Unable to assess   Does the person that you were in contact with have any of the following symptoms?   Cough         shortness of breath           muscle pain         vomiting,            diarrhea            rash            weakness           fever            red eye           abdominal pain           bruising  or  bleeding                joint pain                severe headache               Have you  or someone you have been in contact with traveled internationally in th last month?  NO       If yes, which countries?   Have you  or someone you have been in contact with traveled outside [place] in th last month?   NO      If yes, which state and city?   COMMENTS OR ACTION PLAN FOR THIS PATIENT:         

## 2018-07-03 ENCOUNTER — Encounter: Payer: Self-pay | Admitting: Vascular Surgery

## 2018-07-03 ENCOUNTER — Other Ambulatory Visit: Payer: Self-pay

## 2018-07-03 ENCOUNTER — Ambulatory Visit (HOSPITAL_COMMUNITY)
Admission: RE | Admit: 2018-07-03 | Discharge: 2018-07-03 | Disposition: A | Discharge status: Home / Self Care | Payer: Medicare Other | Source: Ambulatory Visit | Attending: Vascular Surgery | Admitting: Vascular Surgery

## 2018-07-03 ENCOUNTER — Ambulatory Visit (INDEPENDENT_AMBULATORY_CARE_PROVIDER_SITE_OTHER): Payer: Medicare Other | Admitting: Vascular Surgery

## 2018-07-03 VITALS — BP 109/69 | HR 76 | Temp 97.2°F | Resp 20 | Ht 65.0 in | Wt 237.0 lb

## 2018-07-03 DIAGNOSIS — I714 Abdominal aortic aneurysm, without rupture, unspecified: Secondary | ICD-10-CM

## 2018-07-03 NOTE — Progress Notes (Signed)
[facility]  Patient name: [name] MRN: [id number] DOB: [birth year] Sex: female  REASON FOR VISIT: Follow-up stent graft repair abdominal aortic aneurysm on 01/22/2016  HPI: [name] is a 73 y.o. female today for follow-up.  She is done quite well with no new medical difficulties.  She reports that she has had lumbar back surgery since my last visit with her.  Her main complaint is of difficulty in sleeping at night.  Past Medical History:  Diagnosis Date  . AAA (abdominal aortic aneurysm) ([place])    a. s/p stent graft repair 01/2016.  [name] Allergic rhinitis, cause unspecified   . Anxiety state, unspecified   . Chronic LBP   . Coronary artery disease    a. inferior MI 1998 s/p PCI of RCA. b. stenting of Cx 04/2010. c. DES to prox LAD 05/2013. d. DES x 3 in 10/2015.  [name] Degeneration of lumbar or lumbosacral intervertebral disc   . Depressive disorder, not elsewhere classified   . Diabetes mellitus    TYPE II  . Gout 08/22/2013  . Hyperlipidemia   . Hypertension   . Hypothyroidism   . Lower GI bleed 06/2010   Diverticular bleed  . Noncompliance with medications 02/26/2014  . Obesity, unspecified     Family History  Problem Relation Age of Onset  . Heart attack Mother 73       s/p D&C-CARDIAC ARREST 1966  . Heart disease Mother   . Heart attack Father 77       1978 WITH MI  . Heart disease Father   . Diabetes Brother   . Diabetes Maternal Aunt   . Arthritis Maternal Aunt   . Breast cancer Maternal Aunt        Post menopausal  . Colon cancer Neg Hx     SOCIAL HISTORY: Social History   Tobacco Use  . Smoking status: Former Smoker    Years: 30.00    Types: Cigarettes    Quit date: 05/31/1986    Years since quitting: 32.1  . Smokeless tobacco: Never Used  Substance Use Topics  . Alcohol use: No    Allergies  Allergen Reactions  . Prilosec [Omeprazole] Other (See Comments)    Chest pain  .  Miconazole Nitrate Hives    REACTION: hives  . Crestor [Rosuvastatin Calcium] Other (See Comments)    Feeling poor  . Augmentin [Amoxicillin-Pot Clavulanate] Hives, Itching and Rash    Has patient had a PCN reaction causing immediate rash, facial/tongue/throat swelling, SOB or lightheadedness with hypotension:unsure Has patient had a PCN reaction causing severe rash involving mucus membranes or skin necrosis:unsure Has patient had a PCN reaction that required hospitalization:No Has patient had a PCN reaction occurring within the last 10 years:NO If all of the above answers are "NO", then may proceed with Cephalosporin use. Has patient had a PCN reaction causing immediate rash, facial/tongue/throat swelling, SOB or lightheadedness with hypotension:unsure Has patient had a PCN reaction causing severe rash involving mucus membranes or skin necrosis:unsure Has patient had a PCN reaction that required hospitalization:No Has patient had a PCN reaction occurring within the last 10 years:NO If all of the above answers are "NO", then may proceed with Cephalosporin use.   [name] Doxycycline Other (See Comments)    REACTION: gi upset    Current Outpatient Medications  Medication Sig Dispense Refill  . allopurinol (ZYLOPRIM) 300 MG tablet Take 1 tablet (300 mg total) by mouth daily. 90 tablet 3  .  aspirin 81 MG EC tablet Take 81 mg by mouth daily.     [name] atenolol (TENORMIN) 25 MG tablet TAKE 1 TABLET(25 MG) BY MOUTH TWICE DAILY 180 tablet 1  . atorvastatin (LIPITOR) 20 MG tablet Take 1 tablet (20 mg total) by mouth daily. 90 tablet 3  . celecoxib (CELEBREX) 200 MG capsule as needed.    . gabapentin (NEURONTIN) 300 MG capsule 1-2 tab by mouth at bedtime 180 capsule 1  . hydrochlorothiazide (MICROZIDE) 12.5 MG capsule Take 1 capsule (12.5 mg total) by mouth daily. 90 capsule 3  . lactulose (CHRONULAC) 10 GM/15ML solution Take 45 mLs (30 g total) by mouth 2 (two) times daily as needed for mild constipation.  946 mL 2  . loratadine (CLARITIN) 10 MG tablet Take 10 mg by mouth daily.    . metFORMIN (GLUCOPHAGE-XR) 500 MG 24 hr tablet TAKE 4 TABLETS BY MOUTH EVERY MORNING 360 tablet 3  . morphine (MS CONTIN) 15 MG 12 hr tablet Take 1 tablet (15 mg total) by mouth at bedtime. For 7 days ten stop this medication. STOP: 06/16/17. Additional order: 15 mg daily at bedtime for 7 days Start: 06/17/17 Stop: 06/23/17 3 tablet 0  . nitroGLYCERIN (NITROSTAT) 0.4 MG SL tablet Place 1 tablet (0.4 mg total) under the tongue every 5 (five) minutes as needed for chest pain. 25 tablet 3  . oxyCODONE (OXY IR/ROXICODONE) 5 MG immediate release tablet 4 (four) times daily as needed.    . pantoprazole (PROTONIX) 40 MG tablet Take 40 mg by mouth daily.    [name] PLAVIX 75 MG tablet TAKE 1 TABLET BY MOUTH EVERY DAY 90 tablet 3  . SYNTHROID 150 MCG tablet TAKE 1 TABLET(150 MCG) BY MOUTH DAILY 90 tablet 0  . tiZANidine (ZANAFLEX) 2 MG tablet Take 1 tablet (2 mg total) by mouth every 6 (six) hours as needed for muscle spasms. 40 tablet 1  . vitamin B-12 1000 MCG tablet Take 1 tablet (1,000 mcg total) by mouth daily.    . Vitamin D, Ergocalciferol, (DRISDOL) 50000 units CAPS capsule Take 1 capsule (50,000 Units total) by mouth every 7 (seven) days. 12 capsule 0  . zolpidem (AMBIEN) 5 MG tablet Take 1 tablet (5 mg total) by mouth at bedtime as needed for sleep. 30 tablet 5   No current facility-administered medications for this visit.     REVIEW OF SYSTEMS:  [X]  denotes positive finding, [ ]  denotes negative finding Cardiac  Comments:  Chest pain or chest pressure:    Shortness of breath upon exertion:    Short of breath when lying flat:    Irregular heart rhythm:        Vascular    Pain in calf, thigh, or hip brought on by ambulation:    Pain in feet at night that wakes you up from your sleep:     Blood clot in your veins:    Leg swelling:  x         PHYSICAL EXAM: Vitals:   07/03/18 0828  BP: 109/69  Pulse: 76  Resp: 20   Temp: (!) 97.2 F (36.2 C)  SpO2: 95%  Weight: 107.5 kg  Height: 5\' 5"  (1.651 m)    GENERAL: The patient is a well-nourished female, in no acute distress. The vital signs are documented above. CARDIOVASCULAR: 2+ radial and 2+ dorsalis pedis pulses bilaterally.  Abdomen moderately obese with no aneurysm palpable PULMONARY: There is good air exchange  MUSCULOSKELETAL: There are no major deformities or cyanosis.  NEUROLOGIC: No focal weakness or paresthesias are detected. SKIN: There are no ulcers or rashes noted. PSYCHIATRIC: The patient has a normal affect.  DATA:  Duplex shows continued reduction in aneurysm size.  Her aorta was 5.4 cm at the time of her stent graft repair.  Her last follow-up visit revealed a 4-1/2 cm aneurysm.  Today her maximal diameter is 3.5 cm.  MEDICAL ISSUES: Excellent continued result regarding stent graft repair.  She will continue her usual activities.  We will see her again in 2 years with repeat ultrasound of her stent graft.    Rosetta Posner, MD FACS [facility] Tel [phone] Pager [phone]

## 2018-07-16 ENCOUNTER — Other Ambulatory Visit: Payer: Self-pay

## 2018-07-16 MED ORDER — SYNTHROID 150 MCG PO TABS: ORAL_TABLET | ORAL | 1 refills | Status: DC

## 2018-07-17 DIAGNOSIS — H524 Presbyopia: Secondary | ICD-10-CM | POA: Diagnosis not present

## 2018-07-17 DIAGNOSIS — Z7984 Long term (current) use of oral hypoglycemic drugs: Secondary | ICD-10-CM | POA: Diagnosis not present

## 2018-07-17 DIAGNOSIS — H52202 Unspecified astigmatism, left eye: Secondary | ICD-10-CM | POA: Diagnosis not present

## 2018-07-17 DIAGNOSIS — E119 Type 2 diabetes mellitus without complications: Secondary | ICD-10-CM | POA: Diagnosis not present

## 2018-07-17 DIAGNOSIS — H5212 Myopia, left eye: Secondary | ICD-10-CM | POA: Diagnosis not present

## 2018-07-26 DIAGNOSIS — M7918 Myalgia, other site: Secondary | ICD-10-CM | POA: Diagnosis not present

## 2018-07-26 DIAGNOSIS — M4325 Fusion of spine, thoracolumbar region: Secondary | ICD-10-CM | POA: Diagnosis not present

## 2018-07-30 DIAGNOSIS — R2681 Unsteadiness on feet: Secondary | ICD-10-CM | POA: Diagnosis not present

## 2018-07-30 DIAGNOSIS — M545 Low back pain: Secondary | ICD-10-CM | POA: Diagnosis not present

## 2018-07-30 DIAGNOSIS — M6281 Muscle weakness (generalized): Secondary | ICD-10-CM | POA: Diagnosis not present

## 2018-08-02 ENCOUNTER — Telehealth: Payer: Self-pay | Admitting: Internal Medicine

## 2018-08-02 DIAGNOSIS — M6281 Muscle weakness (generalized): Secondary | ICD-10-CM | POA: Diagnosis not present

## 2018-08-02 DIAGNOSIS — R2681 Unsteadiness on feet: Secondary | ICD-10-CM | POA: Diagnosis not present

## 2018-08-02 DIAGNOSIS — M545 Low back pain: Secondary | ICD-10-CM | POA: Diagnosis not present

## 2018-08-02 NOTE — Telephone Encounter (Signed)
Pt is scheduled for doxy visit tomorrow with Dr. [name].  She has a sore throat and ear pain.  She also believes she has a UTI.  Patient would like to know if Dr. [name] would call her something in for this issue today to [facility] on [address].

## 2018-08-02 NOTE — Telephone Encounter (Signed)
Patient calling back to schedule the appointment. Please call back to schedule.

## 2018-08-02 NOTE — Telephone Encounter (Signed)
Ok for doxy visit this evening if she likes

## 2018-08-02 NOTE — Telephone Encounter (Signed)
Left pt vm to call back to schedule. 

## 2018-08-03 ENCOUNTER — Encounter: Payer: Self-pay | Admitting: Internal Medicine

## 2018-08-03 ENCOUNTER — Ambulatory Visit: Payer: Medicare Other | Admitting: Physician Assistant

## 2018-08-03 ENCOUNTER — Ambulatory Visit (INDEPENDENT_AMBULATORY_CARE_PROVIDER_SITE_OTHER): Payer: Medicare Other | Admitting: Internal Medicine

## 2018-08-03 DIAGNOSIS — R300 Dysuria: Secondary | ICD-10-CM | POA: Diagnosis not present

## 2018-08-03 DIAGNOSIS — J309 Allergic rhinitis, unspecified: Secondary | ICD-10-CM | POA: Diagnosis not present

## 2018-08-03 DIAGNOSIS — J029 Acute pharyngitis, unspecified: Secondary | ICD-10-CM

## 2018-08-03 DIAGNOSIS — Z20828 Contact with and (suspected) exposure to other viral communicable diseases: Secondary | ICD-10-CM | POA: Diagnosis not present

## 2018-08-03 MED ORDER — FEXOFENADINE HCL 180 MG PO TABS: 180.0000 mg | ORAL_TABLET | Freq: Every day | ORAL | 11 refills | Status: DC

## 2018-08-03 MED ORDER — CIPROFLOXACIN HCL 500 MG PO TABS: 500.0000 mg | ORAL_TABLET | Freq: Two times a day (BID) | ORAL | 0 refills | Status: AC

## 2018-08-03 MED ORDER — TRIAMCINOLONE ACETONIDE 55 MCG/ACT NA AERO: 2.0000 | INHALATION_SPRAY | Freq: Every day | NASAL | 12 refills | Status: DC

## 2018-08-03 NOTE — Progress Notes (Signed)
Patient ID: [name], female   DOB: 20-Feb-1945, 73 y.o.   MRN: [id number]  Virtual Visit via Video Note  I connected with [name] on 08/03/18 at  1:20 PM EDT by a video enabled telemedicine application and verified that I am speaking with the correct person using two identifiers.  Location: Patient: at home Provider: at office   I discussed the limitations of evaluation and management by telemedicine and the availability of in person appointments. The patient expressed understanding and agreed to proceed.  History of Present Illness: Here with c/o urinary symptoms such as dysuria, frequency, urgency andn lower mid abd pain, but no flank pain, hematuria or n/v, fever, chills.    Also, Here with 2-3 days acute onset fever, facial pain, pressure, headache, general weakness and malaise, and greenish d/c, with mild ST and cough, but pt denies chest pain, wheezing, increased sob or doe, orthopnea, PND, increased LE swelling, palpitations, dizziness or syncope.  Does have several wks ongoing nasal allergy symptoms with clearish congestion, itch and sneezing, without fever, pain, ST, cough, swelling or wheezing. Past Medical History:  Diagnosis Date  . AAA (abdominal aortic aneurysm) ([place])    a. s/p stent graft repair 01/2016.  [name] Allergic rhinitis, cause unspecified   . Anxiety state, unspecified   . Chronic LBP   . Coronary artery disease    a. inferior MI 1998 s/p PCI of RCA. b. stenting of Cx 04/2010. c. DES to prox LAD 05/2013. d. DES x 3 in 10/2015.  [name] Degeneration of lumbar or lumbosacral intervertebral disc   . Depressive disorder, not elsewhere classified   . Diabetes mellitus    TYPE II  . Gout 08/22/2013  . Hyperlipidemia   . Hypertension   . Hypothyroidism   . Lower GI bleed 06/2010   Diverticular bleed  . Noncompliance with medications 02/26/2014  . Obesity, unspecified    Past Surgical History:  Procedure Laterality Date  . CARDIAC CATHETERIZATION     PCI OF BOTH THE  CIRCUMFLEX AND LEFT ANTERIOR DESCENDING ARTERY  . CARDIAC CATHETERIZATION N/A 10/29/2015   Procedure: Coronary Stent Intervention;  Surgeon: Nelva Bush, MD;  Location: [facility] [facility];  Service: Cardiovascular;  Laterality: N/A;  . CARDIAC CATHETERIZATION N/A 10/29/2015   Procedure: Coronary/Graft Angiography;  Surgeon: Nelva Bush, MD;  Location: [facility] [facility];  Service: Cardiovascular;  Laterality: N/A;  . CARDIAC CATHETERIZATION N/A 10/29/2015   Procedure: Intravascular Pressure Wire/FFR Study;  Surgeon: Nelva Bush, MD;  Location: [facility] [facility];  Service: Cardiovascular;  Laterality: N/A;  . CESAREAN SECTION    . ENDOVASCULAR STENT INSERTION N/A 01/22/2016   Procedure: ABDOMINAL AORTIC ENDOVASCULAR STENT GRAFT INSERTION;  Surgeon: Rosetta Posner, MD;  Location: [facility];  Service: Vascular;  Laterality: N/A;  . HEART STENT  04-2010  and  Jun 07, 2013   X 3  . LEFT HEART CATHETERIZATION WITH CORONARY ANGIOGRAM N/A 06/07/2013   Procedure: LEFT HEART CATHETERIZATION WITH CORONARY ANGIOGRAM;  Surgeon: [name], MD;  Location: [facility] C[facility];  Service: Cardiovascular;  Laterality: N/A;  . LEFT HEART CATHETERIZATION WITH CORONARY ANGIOGRAM N/A 02/25/2014   Procedure: LEFT HEART CATHETERIZATION WITH CORONARY ANGIOGRAM;  Surgeon: Troy Sine, MD;  Location: [facility] CATH LAB;  Service: Cardiovascular;  Laterality: N/A;  . LUMBAR FUSION  01/2007   [name]...3-LEVEL WITH FIXATION  . OOPHORECTOMY     BSO? pt.unsure  . PARATHYROIDECTOMY    . SPINE SURGERY    .  THYROIDECTOMY    . TOTAL ABDOMINAL HYSTERECTOMY      reports that she quit smoking about 32 years ago. Her smoking use included cigarettes. She quit after 30.00 years of use. She has never used smokeless tobacco. She reports that she does not drink alcohol or use drugs. family history includes Arthritis in her maternal aunt; Breast cancer in her maternal aunt; Diabetes in her brother and maternal aunt; Heart attack  (age of onset: 68) in her mother; Heart attack (age of onset: 59) in her father; Heart disease in her father and mother. Allergies  Allergen Reactions  . Prilosec [Omeprazole] Other (See Comments)    Chest pain  . Miconazole Nitrate Hives    REACTION: hives  . Crestor [Rosuvastatin Calcium] Other (See Comments)    Feeling poor  . Augmentin [Amoxicillin-Pot Clavulanate] Hives, Itching and Rash    Has patient had a PCN reaction causing immediate rash, facial/tongue/throat swelling, SOB or lightheadedness with hypotension:unsure Has patient had a PCN reaction causing severe rash involving mucus membranes or skin necrosis:unsure Has patient had a PCN reaction that required hospitalization:No Has patient had a PCN reaction occurring within the last 10 years:NO If all of the above answers are "NO", then may proceed with Cephalosporin use. Has patient had a PCN reaction causing immediate rash, facial/tongue/throat swelling, SOB or lightheadedness with hypotension:unsure Has patient had a PCN reaction causing severe rash involving mucus membranes or skin necrosis:unsure Has patient had a PCN reaction that required hospitalization:No Has patient had a PCN reaction occurring within the last 10 years:NO If all of the above answers are "NO", then may proceed with Cephalosporin use.   [name] Doxycycline Other (See Comments)    REACTION: gi upset   Current Outpatient Medications on File Prior to Visit  Medication Sig Dispense Refill  . allopurinol (ZYLOPRIM) 300 MG tablet Take 1 tablet (300 mg total) by mouth daily. 90 tablet 3  . aspirin 81 MG EC tablet Take 81 mg by mouth daily.     [name] atenolol (TENORMIN) 25 MG tablet TAKE 1 TABLET(25 MG) BY MOUTH TWICE DAILY 180 tablet 1  . atorvastatin (LIPITOR) 20 MG tablet Take 1 tablet (20 mg total) by mouth daily. 90 tablet 3  . celecoxib (CELEBREX) 200 MG capsule as needed.    . gabapentin (NEURONTIN) 300 MG capsule 1-2 tab by mouth at bedtime 180 capsule 1  .  hydrochlorothiazide (MICROZIDE) 12.5 MG capsule Take 1 capsule (12.5 mg total) by mouth daily. 90 capsule 3  . lactulose (CHRONULAC) 10 GM/15ML solution Take 45 mLs (30 g total) by mouth 2 (two) times daily as needed for mild constipation. 946 mL 2  . loratadine (CLARITIN) 10 MG tablet Take 10 mg by mouth daily.    . metFORMIN (GLUCOPHAGE-XR) 500 MG 24 hr tablet TAKE 4 TABLETS BY MOUTH EVERY MORNING 360 tablet 3  . methocarbamol (ROBAXIN) 500 MG tablet     . morphine (MS CONTIN) 15 MG 12 hr tablet Take 1 tablet (15 mg total) by mouth at bedtime. For 7 days ten stop this medication. STOP: 06/16/17. Additional order: 15 mg daily at bedtime for 7 days Start: 06/17/17 Stop: 06/23/17 3 tablet 0  . nitroGLYCERIN (NITROSTAT) 0.4 MG SL tablet Place 1 tablet (0.4 mg total) under the tongue every 5 (five) minutes as needed for chest pain. 25 tablet 3  . oxyCODONE (OXY IR/ROXICODONE) 5 MG immediate release tablet 4 (four) times daily as needed.    . pantoprazole (PROTONIX) 40 MG tablet Take  40 mg by mouth daily.    [name] PLAVIX 75 MG tablet TAKE 1 TABLET BY MOUTH EVERY DAY 90 tablet 3  . SYNTHROID 150 MCG tablet TAKE 1 TABLET(150 MCG) BY MOUTH DAILY 90 tablet 1  . vitamin B-12 1000 MCG tablet Take 1 tablet (1,000 mcg total) by mouth daily.    . Vitamin D, Ergocalciferol, (DRISDOL) 50000 units CAPS capsule Take 1 capsule (50,000 Units total) by mouth every 7 (seven) days. 12 capsule 0  . zolpidem (AMBIEN) 5 MG tablet Take 1 tablet (5 mg total) by mouth at bedtime as needed for sleep. 30 tablet 5  . tiZANidine (ZANAFLEX) 2 MG tablet Take 1 tablet (2 mg total) by mouth every 6 (six) hours as needed for muscle spasms. (Patient not taking: Reported on 08/03/2018) 40 tablet 1   No current facility-administered medications on file prior to visit.     Observations/Objective: Alert, NAD, mild ill, nervous mood and affect, resps normal, cn 2-12 intact, moves all 4s, no visible rash or swelling Lab Results  Component Value  Date   WBC 6.2 09/06/2017   HGB 12.4 09/06/2017   HCT 39.4 09/06/2017   PLT 368.0 09/06/2017   GLUCOSE 112 (H) 01/16/2018   CHOL 185 09/06/2017   TRIG 108.0 09/06/2017   HDL 42.40 09/06/2017   LDLDIRECT 131.0 03/27/2015   LDLCALC 121 (H) 09/06/2017   ALT 9 09/06/2017   AST 13 09/06/2017   NA 141 01/16/2018   K 3.9 01/16/2018   CL 106 01/16/2018   CREATININE 0.89 01/16/2018   BUN 13 01/16/2018   CO2 26 01/16/2018   TSH 1.22 01/16/2018   INR 1.08 01/22/2016   HGBA1C 6.3 (A) 01/30/2018   MICROALBUR 11.8 (H) 03/09/2017   Assessment and Plan: See notes  Follow Up Instructions: See notes   I discussed the assessment and treatment plan with the patient. The patient was provided an opportunity to ask questions and all were answered. The patient agreed with the plan and demonstrated an understanding of the instructions.   The patient was advised to call back or seek an in-person evaluation if the symptoms worsen or if the condition fails to improve as anticipated.   [name], MD

## 2018-08-04 ENCOUNTER — Encounter: Payer: Self-pay | Admitting: Internal Medicine

## 2018-08-04 NOTE — Assessment & Plan Note (Signed)
Mild to mod, for allegra and nasacort asd,   to f/u any worsening symptoms or concerns 

## 2018-08-04 NOTE — Assessment & Plan Note (Signed)
Mild to mod, for COVID testing per pt reqeust,  to f/u any worsening symptoms or concerns

## 2018-08-04 NOTE — Assessment & Plan Note (Signed)
Possible uti, for urine studies, empiric antibx, f/u cx results

## 2018-08-04 NOTE — Patient Instructions (Signed)
Please take all new medication as prescribed - the antibiotic  Please continue all other medications as before, and refills have been done if requested.  Please have the pharmacy call with any other refills you may need.  Please continue your efforts at being more active, low cholesterol diet, and weight control.  You are otherwise up to date with prevention measures today.  Please keep your appointments with your specialists as you may have planned  Please go to the LAB in the Basement (turn left off the elevator) for the tests to be done today  You will be contacted by phone if any changes need to be made immediately.  Otherwise, you will receive a letter about your results with an explanation, but please check with MyChart first.

## 2018-08-07 ENCOUNTER — Telehealth: Payer: Self-pay | Admitting: Physician Assistant

## 2018-08-07 NOTE — Progress Notes (Signed)
Cardiology Office Note:    Date:  08/08/2018   ID:  [name], DOB [birth year], MRN [id number]  PCP:  Biagio Borg, MD  Cardiologist:  Sherren Mocha, MD   Electrophysiologist:  None   Referring MD: Biagio Borg, MD   Chief Complaint  Patient presents with  . Follow-up    CAD  . L arm pain     History of Present Illness:    [name] is a 73 y.o. female with:  Coronary artery disease   s/p prior inf MI in 1998 tx with stent to the RCA  S/p stent LCx in 2012  S/p stent to the LAD in 2015  S/p DES x 3 to the RCA 2017  Diabetes mellitus   Hypertension  Hyperlipidemia  AAA  S/p EVAR 01/2016  [name] was last seen by Dr. Burt Knack in 01/2018.  A follow up echocardiogram showed normal LVF.  She returns for follow up.  She is here alone. She has some numbness in her R hand fingertips.  She has notable atrophy over the thenar eminence on that side.  I have asked her to follow up with her PCP for possible carpal tunnel syndrome.  She has not had chest pain.  But, she has had L arm pain in the evening. This is somewhat similar to what she has had in the past related to angina.  She has chronic shortness of breath with activity that is unchanged.  She sleeps on 2 pillows.  She has not had paroxysmal nocturnal dyspnea, leg swelling, syncope.    Prior CV studies:   The following studies were reviewed today:  Echocardiogram 02/09/2018 EF 50-55, no RWMA, mod MAC  Echocardiogram 06/13/17 EF 55-60, no RWMA, Gr 1 DD, mod LAE, trivial TR, PASP 20, trivial eff  Myoview 11/2016 EF 50, breast atten, no ischemia, Low Risk  LHC 10/29/15 LM ok LAD prox stent patent with 30 ISR, dist 70, D2 70 LCx mid 60, OM2 stent patent RCA ost 60, ost-prox 50, prox-mid 70, mid 50, RPDA 40 FFR of RCA 0.71 (hemodynamically significant) PCI: 3 x 28 mm Promus, 3 x 16 mm Promus, 3 x 12 mm Promus DES to RCA  Conclusions: 1. Multivessel coronary artery disease, including 60% ostial and 70%  proximal/mid RCA disease that is hemodynamically significant by FFR (0.71). Mild in-stent restenosis of LAD stents, as well as 60% stenosis in the mid LCx adjacent to previously stented large OM are also present. 2. Successful FFR-guided PCI of the RCA from the ostium to the midvessel with three overlapping Promus Premier stents post-dilated to 3.25 mm with 0% residual stenosis and TIMI-3 flow. 3. Patient had significant pain chest pain beginning with FFR and continuing throughout intervention despite aggressive sedation and analgesia. No clear angiographic or electrocardiographic etiology was identified. Pain had returned to pre-cath level by the end of the procedure.  Echo 10/29/15 EF 55-60, no RWMA, Gr 1 DD, MAC  Echo 11/16 EF 55-60, Gr 1 DD  LHC 2/16 LM ok LAD prox stent ok LCx stent ok RCA prox 40 EF 55-60   Abdominal US 12/15 4 x 4 cm AAA  LHC (06/06/13):  prox LAD 90%, mid LAD stent ok with 40% ISR, small caliber Dx prox 40% mid 70% (inf sub-branch), OM1 stent ok, prox RCA 50% ,mid RCA 40%, EF 65%.  PCI: 3.0 x 22 mm Resolute DES to proximal LAD  Echo (02/2012):  EF 55% to 60%. Wall motion was normal.  Past Medical History:  Diagnosis Date  . AAA (abdominal aortic aneurysm) ([place])    a. s/p stent graft repair 01/2016.  [name] Allergic rhinitis, cause unspecified   . Anxiety state, unspecified   . Chronic LBP   . Coronary artery disease    a. inferior MI 1998 s/p PCI of RCA. b. stenting of Cx 04/2010. c. DES to prox LAD 05/2013. d. DES x 3 in 10/2015.  [name] Degeneration of lumbar or lumbosacral intervertebral disc   . Depressive disorder, not elsewhere classified   . Diabetes mellitus    TYPE II  . Gout 08/22/2013  . Hyperlipidemia   . Hypertension   . Hypothyroidism   . Lower GI bleed 06/2010   Diverticular bleed  . Noncompliance with medications 02/26/2014  . Obesity, unspecified    Surgical Hx: The patient  has a past surgical history that includes Total  abdominal hysterectomy; Lumbar fusion (01/2007); Parathyroidectomy; Thyroidectomy; Cardiac catheterization; Cesarean section; HEART STENT (04-2010  and  Jun 07, 2013); Spine surgery; left heart catheterization with coronary angiogram (N/A, 06/07/2013); left heart catheterization with coronary angiogram (N/A, 02/25/2014); Cardiac catheterization (N/A, 10/29/2015); Cardiac catheterization (N/A, 10/29/2015); Cardiac catheterization (N/A, 10/29/2015); Oophorectomy; and Endovascular stent insertion (N/A, 01/22/2016).   Current Medications: Current Meds  Medication Sig  . allopurinol (ZYLOPRIM) 300 MG tablet Take 1 tablet (300 mg total) by mouth daily.  [name] aspirin 81 MG EC tablet Take 81 mg by mouth daily.   [name] atenolol (TENORMIN) 25 MG tablet TAKE 1 TABLET(25 MG) BY MOUTH TWICE DAILY  . atorvastatin (LIPITOR) 20 MG tablet Take 1 tablet (20 mg total) by mouth daily.  . celecoxib (CELEBREX) 200 MG capsule as needed.  . ciprofloxacin (CIPRO) 500 MG tablet Take 1 tablet (500 mg total) by mouth 2 (two) times daily for 10 days.  . fexofenadine (ALLEGRA) 180 MG tablet Take 1 tablet (180 mg total) by mouth daily.  [name] gabapentin (NEURONTIN) 300 MG capsule 1-2 tab by mouth at bedtime  . lactulose (CHRONULAC) 10 GM/15ML solution Take 45 mLs (30 g total) by mouth 2 (two) times daily as needed for mild constipation.  [name] loratadine (CLARITIN) 10 MG tablet Take 10 mg by mouth daily.  . metFORMIN (GLUCOPHAGE-XR) 500 MG 24 hr tablet TAKE 4 TABLETS BY MOUTH EVERY MORNING  . methocarbamol (ROBAXIN) 500 MG tablet   . nitroGLYCERIN (NITROSTAT) 0.4 MG SL tablet Place 1 tablet (0.4 mg total) under the tongue every 5 (five) minutes as needed for chest pain.  [name] oxyCODONE (OXY IR/ROXICODONE) 5 MG immediate release tablet 4 (four) times daily as needed.  . pantoprazole (PROTONIX) 40 MG tablet Take 40 mg by mouth daily.  [name] PLAVIX 75 MG tablet TAKE 1 TABLET BY MOUTH EVERY DAY  . SYNTHROID 150 MCG tablet TAKE 1 TABLET(150 MCG) BY MOUTH  DAILY  . tiZANidine (ZANAFLEX) 2 MG tablet Take 1 tablet (2 mg total) by mouth every 6 (six) hours as needed for muscle spasms.  [name] triamcinolone (NASACORT) 55 MCG/ACT AERO nasal inhaler Place 2 sprays into the nose daily.  . vitamin B-12 1000 MCG tablet Take 1 tablet (1,000 mcg total) by mouth daily.  . Vitamin D, Ergocalciferol, (DRISDOL) 50000 units CAPS capsule Take 1 capsule (50,000 Units total) by mouth every 7 (seven) days.     Allergies:   Prilosec [omeprazole], Miconazole nitrate, Crestor [rosuvastatin calcium], Augmentin [amoxicillin-pot clavulanate], and Doxycycline   Social History   Tobacco Use  . Smoking status: Former Smoker    Years: 30.00  Types: Cigarettes    Quit date: 05/31/1986    Years since quitting: 32.2  . Smokeless tobacco: Never Used  Substance Use Topics  . Alcohol use: No  . Drug use: No     Family Hx: The patient's family history includes Arthritis in her maternal aunt; Breast cancer in her maternal aunt; Diabetes in her brother and maternal aunt; Heart attack (age of onset: 16) in her mother; Heart attack (age of onset: 74) in her father; Heart disease in her father and mother. There is no history of Colon cancer.  ROS:   Please see the history of present illness.    ROS All other systems reviewed and are negative.   EKGs/Labs/Other Test Reviewed:    EKG:  EKG is   ordered today.  The ekg ordered today demonstrates normal sinus rhythm, HR 73, LAD, RBBB, QTc 464, no change.   Recent Labs: 09/06/2017: ALT 9; Hemoglobin 12.4; Platelets 368.0 01/16/2018: BUN 13; Creatinine, Ser 0.89; Potassium 3.9; Pro B Natriuretic peptide (BNP) 34.0; Sodium 141; TSH 1.22   Recent Lipid Panel Lab Results  Component Value Date/Time   CHOL 185 09/06/2017 02:27 PM   CHOL 181 11/30/2016 10:53 AM   TRIG 108.0 09/06/2017 02:27 PM   TRIG 102 01/02/2006 08:25 AM   HDL 42.40 09/06/2017 02:27 PM   HDL 29 (L) 11/30/2016 10:53 AM   CHOLHDL 4 09/06/2017 02:27 PM    LDLCALC 121 (H) 09/06/2017 02:27 PM   LDLCALC 123 (H) 11/30/2016 10:53 AM   LDLDIRECT 131.0 03/27/2015 11:37 AM    Physical Exam:    VS:  BP 118/70   Pulse 73   Ht _0  (1.651 m)   Wt 234 lb 12.8 oz (106.5 kg)   SpO2 97%   BMI 39.07 kg/m     Wt Readings from Last 3 Encounters:  08/08/18 234 lb 12.8 oz (106.5 kg)  07/03/18 237 lb (107.5 kg)  03/13/18 236 lb (107 kg)     Physical Exam  Constitutional: She is oriented to person, place, and time. She appears well-developed and well-nourished. No distress.  HENT:  Head: Normocephalic and atraumatic.  Eyes: No scleral icterus.  Neck: No JVD present. No thyromegaly present.  Cardiovascular: Normal rate, regular rhythm and normal heart sounds.  No murmur heard. Pulmonary/Chest: Effort normal and breath sounds normal. She has no rales.  Abdominal: Soft. There is no hepatomegaly.  Musculoskeletal:        General: No edema.  Lymphadenopathy:    She has no cervical adenopathy.  Neurological: She is alert and oriented to person, place, and time.  Skin: Skin is warm and dry.  Psychiatric: She has a normal mood and affect.    ASSESSMENT & PLAN:    1. Coronary artery disease involving native coronary artery of native heart with angina pectoris s/p prior stenting to the LAD and LCx in the past and most recentlyoverlapping DES x 3 to the RCA in 10/17 in the setting of [place] (no ACS).  Myoview in 2018 was low risk.  She has not had chest pain but has L arm pain similar to her prior angina.  Her ECG is unchanged.    -Continue ASA, Plavix, beta-blocker   -Resume statin Rx (see below)  -Arrange Lexiscan Myoview to rule out ischemia    2. Essential hypertension The patient's blood pressure is controlled on her current regimen.  Continue current therapy.    3. Mixed hyperlipidemia LDL too high on labs in 2019.  She is  not taking Lipitor.  I have asked her to resume this.  Obtain BMET, Lipids, LFTs in 3 mos.  If she has side effects, she  will call.  Consider Zetia at that point.    4. AAA (abdominal aortic aneurysm) without rupture ([place]) 5. History of endovascular stent graft for abdominal aortic aneurysm (AAA) FU with [facility] as planned.    Dispo:  Return in about 6 months (around 02/08/2019) for Routine Follow Up, w/ Dr. Burt Knack, or Richardson Dopp, PA-C.   Medication Adjustments/Labs and Tests Ordered: Current medicines are reviewed at length with the patient today.  Concerns regarding medicines are outlined above.  Tests Ordered: Orders Placed This Encounter  Procedures  . Basic metabolic panel  . Hepatic function panel  . Lipid panel  . MYOCARDIAL PERFUSION IMAGING  . MYOCARDIAL PERFUSION IMAGING  . EKG 12-Lead   Medication Changes: No orders of the defined types were placed in this encounter.   Signed, Richardson Dopp, PA-C  08/08/2018 4:05 PM    [facility] [address], [place], [place]  [address] Phone: [phone]; Fax: [phone]

## 2018-08-07 NOTE — Telephone Encounter (Signed)

## 2018-08-08 ENCOUNTER — Encounter: Payer: Self-pay | Admitting: Physician Assistant

## 2018-08-08 ENCOUNTER — Other Ambulatory Visit: Payer: Self-pay

## 2018-08-08 ENCOUNTER — Ambulatory Visit (INDEPENDENT_AMBULATORY_CARE_PROVIDER_SITE_OTHER): Payer: Medicare Other | Admitting: Physician Assistant

## 2018-08-08 ENCOUNTER — Encounter: Payer: Self-pay | Admitting: *Deleted

## 2018-08-08 VITALS — BP 118/70 | HR 73 | Ht 65.0 in | Wt 234.8 lb

## 2018-08-08 DIAGNOSIS — R2681 Unsteadiness on feet: Secondary | ICD-10-CM | POA: Diagnosis not present

## 2018-08-08 DIAGNOSIS — Z95828 Presence of other vascular implants and grafts: Secondary | ICD-10-CM | POA: Diagnosis not present

## 2018-08-08 DIAGNOSIS — I25119 Atherosclerotic heart disease of native coronary artery with unspecified angina pectoris: Secondary | ICD-10-CM

## 2018-08-08 DIAGNOSIS — I10 Essential (primary) hypertension: Secondary | ICD-10-CM

## 2018-08-08 DIAGNOSIS — M545 Low back pain: Secondary | ICD-10-CM | POA: Diagnosis not present

## 2018-08-08 DIAGNOSIS — I714 Abdominal aortic aneurysm, without rupture, unspecified: Secondary | ICD-10-CM

## 2018-08-08 DIAGNOSIS — E782 Mixed hyperlipidemia: Secondary | ICD-10-CM

## 2018-08-08 DIAGNOSIS — M6281 Muscle weakness (generalized): Secondary | ICD-10-CM | POA: Diagnosis not present

## 2018-08-08 NOTE — Patient Instructions (Addendum)
Medication Instructions:   Start taking Lipitor 20 mg once a day EVERY DAY    If you need a refill on your cardiac medications before your next appointment, please call your pharmacy.   Lab work: RETURN  BMET LFT LIPIDS IN 3 MONTHS   If you have labs (blood work) drawn today and your tests are completely normal, you will receive your results only by: [name] MyChart Message (if you have MyChart) OR . A paper copy in the mail If you have any lab test that is abnormal or we need to change your treatment, we will call you to review the results.  Testing/Procedures:  Your physician has requested that you have a lexiscan myoview. For further information please visit [email or web address]. Please follow instruction sheet, as given.  Follow-Up: At [facility], you and your health needs are our priority.  As part of our continuing mission to provide you with exceptional heart care, we have created designated Provider Care Teams.  These Care Teams include your primary Cardiologist (physician) and Advanced Practice Providers (APPs -  Physician Assistants and Nurse Practitioners) who all work together to provide you with the care you need, when you need it. You will need a follow up appointment in:  6 months.  Please call our office 2 months in advance to schedule this appointment.  You may see Sherren Mocha, MD or one of the following Advanced Practice Providers on your designated Care Team: Richardson Dopp, PA-C [place], [place] . Daune Perch, NP  Any Other Special Instructions Will Be Listed Below (If Applicable).

## 2018-08-09 DIAGNOSIS — M6281 Muscle weakness (generalized): Secondary | ICD-10-CM | POA: Diagnosis not present

## 2018-08-09 DIAGNOSIS — R2681 Unsteadiness on feet: Secondary | ICD-10-CM | POA: Diagnosis not present

## 2018-08-09 DIAGNOSIS — M545 Low back pain: Secondary | ICD-10-CM | POA: Diagnosis not present

## 2018-08-13 DIAGNOSIS — M545 Low back pain: Secondary | ICD-10-CM | POA: Diagnosis not present

## 2018-08-13 DIAGNOSIS — M6281 Muscle weakness (generalized): Secondary | ICD-10-CM | POA: Diagnosis not present

## 2018-08-13 DIAGNOSIS — R2681 Unsteadiness on feet: Secondary | ICD-10-CM | POA: Diagnosis not present

## 2018-08-15 ENCOUNTER — Telehealth (HOSPITAL_COMMUNITY): Payer: Self-pay | Admitting: *Deleted

## 2018-08-15 DIAGNOSIS — M6281 Muscle weakness (generalized): Secondary | ICD-10-CM | POA: Diagnosis not present

## 2018-08-15 DIAGNOSIS — M545 Low back pain: Secondary | ICD-10-CM | POA: Diagnosis not present

## 2018-08-15 DIAGNOSIS — R2681 Unsteadiness on feet: Secondary | ICD-10-CM | POA: Diagnosis not present

## 2018-08-15 NOTE — Telephone Encounter (Signed)
Patient given detailed instructions per Myocardial Perfusion Study Information Sheet for the test on 08/17/18 at 10:30. Patient notified to arrive 15 minutes early and that it is imperative to arrive on time for appointment to keep from having the test rescheduled.  If you need to cancel or reschedule your appointment, please call the office within 24 hours of your appointment. . Patient verbalized understanding.[name]

## 2018-08-17 ENCOUNTER — Ambulatory Visit (HOSPITAL_COMMUNITY): Payer: Medicare Other | Attending: Internal Medicine

## 2018-08-17 ENCOUNTER — Other Ambulatory Visit: Payer: Self-pay

## 2018-08-17 DIAGNOSIS — I25119 Atherosclerotic heart disease of native coronary artery with unspecified angina pectoris: Secondary | ICD-10-CM | POA: Insufficient documentation

## 2018-08-17 LAB — MYOCARDIAL PERFUSION IMAGING
LV dias vol: 82 mL (ref 46–106)
LV sys vol: 34 mL
Peak HR: 93 {beats}/min
Rest HR: 68 {beats}/min
SDS: 0
SRS: 0
SSS: 0
TID: 0.94

## 2018-08-17 MED ORDER — REGADENOSON 0.4 MG/5ML IV SOLN
0.4000 mg | Freq: Once | INTRAVENOUS | Status: AC
  Administered 2018-08-17: 0.4 mg via INTRAVENOUS

## 2018-08-17 MED ORDER — TECHNETIUM TC 99M TETROFOSMIN IV KIT
11.0000 | PACK | Freq: Once | INTRAVENOUS | Status: AC | PRN
  Administered 2018-08-17: 11 via INTRAVENOUS
  Filled 2018-08-17: qty 11

## 2018-08-17 MED ORDER — TECHNETIUM TC 99M TETROFOSMIN IV KIT
32.6000 | PACK | Freq: Once | INTRAVENOUS | Status: AC | PRN
  Administered 2018-08-17: 32.6 via INTRAVENOUS
  Filled 2018-08-17: qty 33

## 2018-08-19 ENCOUNTER — Encounter: Payer: Self-pay | Admitting: Physician Assistant

## 2018-08-22 DIAGNOSIS — R2681 Unsteadiness on feet: Secondary | ICD-10-CM | POA: Diagnosis not present

## 2018-08-22 DIAGNOSIS — M6281 Muscle weakness (generalized): Secondary | ICD-10-CM | POA: Diagnosis not present

## 2018-08-22 DIAGNOSIS — M545 Low back pain: Secondary | ICD-10-CM | POA: Diagnosis not present

## 2018-08-27 DIAGNOSIS — R2681 Unsteadiness on feet: Secondary | ICD-10-CM | POA: Diagnosis not present

## 2018-08-27 DIAGNOSIS — M545 Low back pain: Secondary | ICD-10-CM | POA: Diagnosis not present

## 2018-08-27 DIAGNOSIS — M6281 Muscle weakness (generalized): Secondary | ICD-10-CM | POA: Diagnosis not present

## 2018-08-30 DIAGNOSIS — R2681 Unsteadiness on feet: Secondary | ICD-10-CM | POA: Diagnosis not present

## 2018-08-30 DIAGNOSIS — M6281 Muscle weakness (generalized): Secondary | ICD-10-CM | POA: Diagnosis not present

## 2018-08-30 DIAGNOSIS — M545 Low back pain: Secondary | ICD-10-CM | POA: Diagnosis not present

## 2018-09-05 DIAGNOSIS — M545 Low back pain: Secondary | ICD-10-CM | POA: Diagnosis not present

## 2018-09-05 DIAGNOSIS — M6281 Muscle weakness (generalized): Secondary | ICD-10-CM | POA: Diagnosis not present

## 2018-09-05 DIAGNOSIS — R2681 Unsteadiness on feet: Secondary | ICD-10-CM | POA: Diagnosis not present

## 2018-09-07 ENCOUNTER — Ambulatory Visit: Payer: Medicare Other | Admitting: Internal Medicine

## 2018-09-12 ENCOUNTER — Other Ambulatory Visit: Payer: Self-pay

## 2018-09-12 ENCOUNTER — Encounter: Payer: Self-pay | Admitting: Internal Medicine

## 2018-09-12 ENCOUNTER — Other Ambulatory Visit: Payer: Self-pay | Admitting: Internal Medicine

## 2018-09-12 ENCOUNTER — Other Ambulatory Visit (INDEPENDENT_AMBULATORY_CARE_PROVIDER_SITE_OTHER): Payer: Medicare Other

## 2018-09-12 ENCOUNTER — Ambulatory Visit (INDEPENDENT_AMBULATORY_CARE_PROVIDER_SITE_OTHER): Payer: Medicare Other | Admitting: Internal Medicine

## 2018-09-12 VITALS — BP 112/68 | HR 86 | Temp 98.5°F | Ht 65.0 in | Wt 233.0 lb

## 2018-09-12 DIAGNOSIS — G8929 Other chronic pain: Secondary | ICD-10-CM | POA: Diagnosis not present

## 2018-09-12 DIAGNOSIS — E538 Deficiency of other specified B group vitamins: Secondary | ICD-10-CM | POA: Diagnosis not present

## 2018-09-12 DIAGNOSIS — E611 Iron deficiency: Secondary | ICD-10-CM

## 2018-09-12 DIAGNOSIS — R2681 Unsteadiness on feet: Secondary | ICD-10-CM | POA: Diagnosis not present

## 2018-09-12 DIAGNOSIS — E11311 Type 2 diabetes mellitus with unspecified diabetic retinopathy with macular edema: Secondary | ICD-10-CM

## 2018-09-12 DIAGNOSIS — Z0001 Encounter for general adult medical examination with abnormal findings: Secondary | ICD-10-CM

## 2018-09-12 DIAGNOSIS — M545 Low back pain, unspecified: Secondary | ICD-10-CM

## 2018-09-12 DIAGNOSIS — R202 Paresthesia of skin: Secondary | ICD-10-CM

## 2018-09-12 DIAGNOSIS — E782 Mixed hyperlipidemia: Secondary | ICD-10-CM

## 2018-09-12 DIAGNOSIS — E559 Vitamin D deficiency, unspecified: Secondary | ICD-10-CM

## 2018-09-12 DIAGNOSIS — Z0000 Encounter for general adult medical examination without abnormal findings: Secondary | ICD-10-CM

## 2018-09-12 DIAGNOSIS — M6281 Muscle weakness (generalized): Secondary | ICD-10-CM | POA: Diagnosis not present

## 2018-09-12 LAB — URINALYSIS, ROUTINE W REFLEX MICROSCOPIC
Bilirubin Urine: NEGATIVE
Ketones, ur: NEGATIVE
Nitrite: POSITIVE — AB
Specific Gravity, Urine: 1.02 (ref 1.000–1.030)
Total Protein, Urine: NEGATIVE
Urine Glucose: NEGATIVE
Urobilinogen, UA: 1 (ref 0.0–1.0)
pH: 6 (ref 5.0–8.0)

## 2018-09-12 LAB — BASIC METABOLIC PANEL
BUN: 8 mg/dL (ref 6–23)
CO2: 29 mEq/L (ref 19–32)
Calcium: 9 mg/dL (ref 8.4–10.5)
Chloride: 103 mEq/L (ref 96–112)
Creatinine, Ser: 0.91 mg/dL (ref 0.40–1.20)
GFR: 73.31 mL/min (ref >60.00)
Glucose, Bld: 130 mg/dL — ABNORMAL HIGH (ref 70–99)
Potassium: 3.9 mEq/L (ref 3.5–5.1)
Sodium: 142 mEq/L (ref 135–145)

## 2018-09-12 LAB — HEPATIC FUNCTION PANEL
ALT: 16 U/L (ref 0–35)
AST: 22 U/L (ref 0–37)
Albumin: 4 g/dL (ref 3.5–5.2)
Alkaline Phosphatase: 109 U/L (ref 39–117)
Bilirubin, Direct: 0.1 mg/dL (ref 0.0–0.3)
Total Bilirubin: 0.7 mg/dL (ref 0.2–1.2)
Total Protein: 7.7 g/dL (ref 6.0–8.3)

## 2018-09-12 LAB — CBC WITH DIFFERENTIAL/PLATELET
Basophils Absolute: 0 10*3/uL (ref 0.0–0.1)
Basophils Relative: 0.5 % (ref 0.0–3.0)
Eosinophils Absolute: 0.2 10*3/uL (ref 0.0–0.7)
Eosinophils Relative: 2.4 % (ref 0.0–5.0)
HCT: 43.2 % (ref 36.0–46.0)
Hemoglobin: 13.7 g/dL (ref 12.0–15.0)
Lymphocytes Relative: 22.2 % (ref 12.0–46.0)
Lymphs Abs: 1.6 10*3/uL (ref 0.7–4.0)
MCHC: 31.8 g/dL (ref 30.0–36.0)
MCV: 86.9 fl (ref 78.0–100.0)
Monocytes Absolute: 0.6 10*3/uL (ref 0.1–1.0)
Monocytes Relative: 8.1 % (ref 3.0–12.0)
Neutro Abs: 4.7 10*3/uL (ref 1.4–7.7)
Neutrophils Relative %: 66.8 % (ref 43.0–77.0)
Platelets: 325 10*3/uL (ref 150.0–400.0)
RBC: 4.98 Mil/uL (ref 3.87–5.11)
RDW: 16.7 % — ABNORMAL HIGH (ref 11.5–15.5)
WBC: 7 10*3/uL (ref 4.0–10.5)

## 2018-09-12 LAB — MICROALBUMIN / CREATININE URINE RATIO
Creatinine,U: 194.3 mg/dL
Microalb Creat Ratio: 0.8 mg/g (ref 0.0–30.0)
Microalb, Ur: 1.6 mg/dL (ref 0.0–1.9)

## 2018-09-12 LAB — LIPID PANEL
Cholesterol: 159 mg/dL (ref 0–200)
HDL: 34.4 mg/dL — ABNORMAL LOW (ref >39.00)
LDL Cholesterol: 105 mg/dL — ABNORMAL HIGH (ref 0–99)
NonHDL: 124.2
Total CHOL/HDL Ratio: 5
Triglycerides: 96 mg/dL (ref 0.0–149.0)
VLDL: 19.2 mg/dL (ref 0.0–40.0)

## 2018-09-12 LAB — VITAMIN B12: Vitamin B-12: 156 pg/mL — ABNORMAL LOW (ref 211–911)

## 2018-09-12 LAB — IBC PANEL
Iron: 63 ug/dL (ref 42–145)
Saturation Ratios: 17.6 % — ABNORMAL LOW (ref 20.0–50.0)
Transferrin: 255 mg/dL (ref 212.0–360.0)

## 2018-09-12 LAB — HEMOGLOBIN A1C: Hgb A1c MFr Bld: 7 % — ABNORMAL HIGH (ref 4.6–6.5)

## 2018-09-12 LAB — VITAMIN D 25 HYDROXY (VIT D DEFICIENCY, FRACTURES): VITD: <7 ng/mL — ABNORMAL LOW (ref 30.00–100.00)

## 2018-09-12 LAB — TSH: TSH: 1.11 u[IU]/mL (ref 0.35–4.50)

## 2018-09-12 MED ORDER — ZOSTER VAC RECOMB ADJUVANTED 50 MCG/0.5ML IM SUSR: 0.5000 mL | Freq: Once | INTRAMUSCULAR | 1 refills | Status: AC

## 2018-09-12 MED ORDER — KETOROLAC TROMETHAMINE 30 MG/ML IJ SOLN
30.0000 mg | Freq: Once | INTRAMUSCULAR | Status: AC
  Administered 2018-09-12: 30 mg via INTRAMUSCULAR

## 2018-09-12 MED ORDER — VITAMIN D (ERGOCALCIFEROL) 1.25 MG (50000 UNIT) PO CAPS: 50000.0000 [IU] | ORAL_CAPSULE | ORAL | 0 refills | Status: DC

## 2018-09-12 MED ORDER — ATORVASTATIN CALCIUM 20 MG PO TABS: 20.0000 mg | ORAL_TABLET | Freq: Every day | ORAL | 3 refills | Status: DC

## 2018-09-12 NOTE — Progress Notes (Signed)
Subjective:    Patient ID: [name], female    DOB: 12/09/1945, 73 y.o.   MRN: [id number]  HPI  Here for wellness and f/u;  Overall doing ok;  Pt denies Chest pain, worsening SOB, DOE, wheezing, orthopnea, PND, worsening LE edema, palpitations, dizziness or syncope.  Pt denies neurological change such as new headache, facial or extremity weakness.  Pt denies polydipsia, polyuria, or low sugar symptoms. Pt states overall good compliance with treatment and medications, good tolerability, and has been trying to follow appropriate diet.  Pt denies worsening depressive symptoms, suicidal ideation or panic. No fever, night sweats, wt loss, loss of appetite, or other constitutional symptoms.  Pt states good ability with ADL's, has low fall risk, home safety reviewed and adequate, no other significant changes in hearing or vision, and only occasionally active with exercise.  Has been crestor intolerant, but has lipitor at home, not taking but willing to try now.  Also ok for shingrix Also, Pt continues to have recurring now acute on chroni LBP without change in severity, bowel or bladder change, fever, wt loss,  worsening LE pain/numbness/weakness, gait change or falls. Also c/o 1 mo onset mild to mod right hand numbness and weakness without pain, intermittent and recurrent and worse in the AM, better later in the day, nothing else makes better or worse Past Medical History:  Diagnosis Date  . AAA (abdominal aortic aneurysm) ([place])    a. s/p stent graft repair 01/2016.  [name] Allergic rhinitis, cause unspecified   . Anxiety state, unspecified   . Chronic LBP   . Coronary artery disease    a. inferior MI 1998 s/p PCI of RCA. b. stenting of Cx 04/2010. c. DES to prox LAD 05/2013. d. DES x 3 in 10/2015. // Myoview 07/2018:  EF 59, normal perfusion, low risk   . Degeneration of lumbar or lumbosacral intervertebral disc   . Depressive disorder, not elsewhere classified   . Diabetes mellitus    TYPE II  . Gout  08/22/2013  . Hyperlipidemia   . Hypertension   . Hypothyroidism   . Lower GI bleed 06/2010   Diverticular bleed  . Noncompliance with medications 02/26/2014  . Obesity, unspecified    Past Surgical History:  Procedure Laterality Date  . CARDIAC CATHETERIZATION     PCI OF BOTH THE CIRCUMFLEX AND LEFT ANTERIOR DESCENDING ARTERY  . CARDIAC CATHETERIZATION N/A 10/29/2015   Procedure: Coronary Stent Intervention;  Surgeon: Nelva Bush, MD;  Location: [facility];  Service: Cardiovascular;  Laterality: N/A;  . CARDIAC CATHETERIZATION N/A 10/29/2015   Procedure: Coronary/Graft Angiography;  Surgeon: Nelva Bush, MD;  Location: [facility];  Service: Cardiovascular;  Laterality: N/A;  . CARDIAC CATHETERIZATION N/A 10/29/2015   Procedure: Intravascular Pressure Wire/FFR Study;  Surgeon: Nelva Bush, MD;  Location: [facility];  Service: Cardiovascular;  Laterality: N/A;  . CESAREAN SECTION    . ENDOVASCULAR STENT INSERTION N/A 01/22/2016   Procedure: ABDOMINAL AORTIC ENDOVASCULAR STENT GRAFT INSERTION;  Surgeon: Rosetta Posner, MD;  Location: [facility];  Service: Vascular;  Laterality: N/A;  . HEART STENT  04-2010  and  Jun 07, 2013   X 3  . LEFT HEART CATHETERIZATION WITH CORONARY ANGIOGRAM N/A 06/07/2013   Procedure: LEFT HEART CATHETERIZATION WITH CORONARY ANGIOGRAM;  Surgeon: [name], MD;  Location: [facility] C[facility] LAB;  Service: Cardiovascular;  Laterality: N/A;  . LEFT HEART CATHETERIZATION WITH CORONARY ANGIOGRAM N/A 02/25/2014   Procedure: LEFT HEART  CATHETERIZATION WITH CORONARY ANGIOGRAM;  Surgeon: Troy Sine, MD;  Location: [facility] CATH LAB;  Service: Cardiovascular;  Laterality: N/A;  . LUMBAR FUSION  01/2007   DR. Patrice Paradise...3-LEVEL WITH FIXATION  . OOPHORECTOMY     BSO? pt.unsure  . PARATHYROIDECTOMY    . SPINE SURGERY    . THYROIDECTOMY    . TOTAL ABDOMINAL HYSTERECTOMY      reports that she quit smoking about 32 years ago. Her smoking use included  cigarettes. She quit after 30.00 years of use. She has never used smokeless tobacco. She reports that she does not drink alcohol or use drugs. family history includes Arthritis in her maternal aunt; Breast cancer in her maternal aunt; Diabetes in her brother and maternal aunt; Heart attack (age of onset: 9) in her mother; Heart attack (age of onset: 32) in her father; Heart disease in her father and mother. Allergies  Allergen Reactions  . Prilosec [Omeprazole] Other (See Comments)    Chest pain  . Miconazole Nitrate Hives    REACTION: hives  . Crestor [Rosuvastatin Calcium] Other (See Comments)    Feeling poor  . Augmentin [Amoxicillin-Pot Clavulanate] Hives, Itching and Rash    Has patient had a PCN reaction causing immediate rash, facial/tongue/throat swelling, SOB or lightheadedness with hypotension:unsure Has patient had a PCN reaction causing severe rash involving mucus membranes or skin necrosis:unsure Has patient had a PCN reaction that required hospitalization:No Has patient had a PCN reaction occurring within the last 10 years:NO If all of the above answers are "NO", then may proceed with Cephalosporin use. Has patient had a PCN reaction causing immediate rash, facial/tongue/throat swelling, SOB or lightheadedness with hypotension:unsure Has patient had a PCN reaction causing severe rash involving mucus membranes or skin necrosis:unsure Has patient had a PCN reaction that required hospitalization:No Has patient had a PCN reaction occurring within the last 10 years:NO If all of the above answers are "NO", then may proceed with Cephalosporin use.   [name] Doxycycline Other (See Comments)    REACTION: gi upset   Current Outpatient Medications on File Prior to Visit  Medication Sig Dispense Refill  . allopurinol (ZYLOPRIM) 300 MG tablet Take 1 tablet (300 mg total) by mouth daily. 90 tablet 3  . aspirin 81 MG EC tablet Take 81 mg by mouth daily.     [name] atenolol (TENORMIN) 25 MG tablet  TAKE 1 TABLET(25 MG) BY MOUTH TWICE DAILY 180 tablet 1  . celecoxib (CELEBREX) 200 MG capsule as needed.    . fexofenadine (ALLEGRA) 180 MG tablet Take 1 tablet (180 mg total) by mouth daily. 30 tablet 11  . gabapentin (NEURONTIN) 300 MG capsule 1-2 tab by mouth at bedtime 180 capsule 1  . hydrochlorothiazide (MICROZIDE) 12.5 MG capsule Take 1 capsule (12.5 mg total) by mouth daily. 90 capsule 3  . lactulose (CHRONULAC) 10 GM/15ML solution Take 45 mLs (30 g total) by mouth 2 (two) times daily as needed for mild constipation. 946 mL 2  . loratadine (CLARITIN) 10 MG tablet Take 10 mg by mouth daily.    . metFORMIN (GLUCOPHAGE-XR) 500 MG 24 hr tablet TAKE 4 TABLETS BY MOUTH EVERY MORNING 360 tablet 3  . methocarbamol (ROBAXIN) 500 MG tablet     . nitroGLYCERIN (NITROSTAT) 0.4 MG SL tablet Place 1 tablet (0.4 mg total) under the tongue every 5 (five) minutes as needed for chest pain. 25 tablet 3  . oxyCODONE (OXY IR/ROXICODONE) 5 MG immediate release tablet 4 (four) times daily as needed.    [name]  pantoprazole (PROTONIX) 40 MG tablet Take 40 mg by mouth daily.    [name] PLAVIX 75 MG tablet TAKE 1 TABLET BY MOUTH EVERY DAY 90 tablet 3  . SYNTHROID 150 MCG tablet TAKE 1 TABLET(150 MCG) BY MOUTH DAILY 90 tablet 1  . tiZANidine (ZANAFLEX) 2 MG tablet Take 1 tablet (2 mg total) by mouth every 6 (six) hours as needed for muscle spasms. 40 tablet 1  . triamcinolone (NASACORT) 55 MCG/ACT AERO nasal inhaler Place 2 sprays into the nose daily. 1 Inhaler 12  . vitamin B-12 1000 MCG tablet Take 1 tablet (1,000 mcg total) by mouth daily.     No current facility-administered medications on file prior to visit.    Review of Systems Constitutional: Negative for other unusual diaphoresis, sweats, appetite or weight changes HENT: Negative for other worsening hearing loss, ear pain, facial swelling, mouth sores or neck stiffness.   Eyes: Negative for other worsening pain, redness or other visual disturbance.  Respiratory:  Negative for other stridor or swelling Cardiovascular: Negative for other palpitations or other chest pain  Gastrointestinal: Negative for worsening diarrhea or loose stools, blood in stool, distention or other pain Genitourinary: Negative for hematuria, flank pain or other change in urine volume.  Musculoskeletal: Negative for myalgias or other joint swelling.  Skin: Negative for other color change, or other wound or worsening drainage.  Neurological: Negative for other syncope or numbness. Hematological: Negative for other adenopathy or swelling Psychiatric/Behavioral: Negative for hallucinations, other worsening agitation, SI, self-injury, or new decreased concentration ALl other system neg per pt    Objective:   Physical Exam BP 112/68   Pulse 86   Temp 98.5 F (36.9 C) (Oral)   Ht 5\' 5"  (1.651 m)   Wt 233 lb (105.7 kg)   SpO2 95%   BMI 38.77 kg/m  VS noted,  Constitutional: Pt is oriented to person, place, and time. Appears well-developed and well-nourished, in no significant distress and comfortable Head: Normocephalic and atraumatic  Eyes: Conjunctivae and EOM are normal. Pupils are equal, round, and reactive to light Right Ear: External ear normal without discharge Left Ear: External ear normal without discharge Nose: Nose without discharge or deformity Mouth/Throat: Oropharynx is without other ulcerations and moist  Neck: Normal range of motion. Neck supple. No JVD present. No tracheal deviation present or significant neck LA or mass Cardiovascular: Normal rate, regular rhythm, normal heart sounds and intact distal pulses.   Pulmonary/Chest: WOB normal and breath sounds without rales or wheezing  Abdominal: Soft. Bowel sounds are normal. NT. No HSM  Musculoskeletal: Normal range of motion. Exhibits no edema Lymphadenopathy: Has no other cervical adenopathy.  Neurological: Pt is alert and oriented to person, place, and time. Pt has normal reflexes. No cranial nerve  deficit. Motor grossly intact, Gait intact Skin: Skin is warm and dry. No rash noted or new ulcerations Psychiatric:  Has normal mood and affect. Behavior is normal without agitation No other exam findings Lab Results  Component Value Date   WBC 7.0 09/12/2018   HGB 13.7 09/12/2018   HCT 43.2 09/12/2018   PLT 325.0 09/12/2018   GLUCOSE 130 (H) 09/12/2018   CHOL 159 09/12/2018   TRIG 96.0 09/12/2018   HDL 34.40 (L) 09/12/2018   LDLDIRECT 131.0 03/27/2015   LDLCALC 105 (H) 09/12/2018   ALT 16 09/12/2018   AST 22 09/12/2018   NA 142 09/12/2018   K 3.9 09/12/2018   CL 103 09/12/2018   CREATININE 0.91 09/12/2018   BUN 8  09/12/2018   CO2 29 09/12/2018   TSH 1.11 09/12/2018   INR 1.08 01/22/2016   HGBA1C 7.0 (H) 09/12/2018   MICROALBUR 1.6 09/12/2018       Assessment & Plan:

## 2018-09-12 NOTE — Patient Instructions (Addendum)
OK to start the lipitor as prescribed  You are given the shingles shot prescription hardcopy today  You had the toradol 30 mg pain shot today  Continue your Physical Therapy as you have planned  You will be contacted regarding the referral for: Neurology for the right hand numbness and weakness  Please go to the LAB in the Basement (turn left off the elevator) for the tests to be done today  You will be contacted by phone if any changes need to be made immediately.  Otherwise, you will receive a letter about your results with an explanation, but please check with MyChart first.  Please remember to sign up for MyChart if you have not done so, as this will be important to you in the future with finding out test results, communicating by private email, and scheduling acute appointments online when needed.  Please return in 6 months, or sooner if needed, with Lab testing done 3-5 days before

## 2018-09-13 ENCOUNTER — Telehealth: Payer: Self-pay

## 2018-09-13 NOTE — Telephone Encounter (Signed)
-----   Message from Biagio Borg, MD sent at 09/12/2018  8:50 PM EDT ----- Left message on MyChart, pt to cont same tx except  The test results show that your current treatment is OK, as the tests are stable, except the Vitamin D level and the Vitamin B12 levels are low.    We need to: 1) Please take Vitamin D 50000 units weekly for 12 weeks, then plan to change to OTC Vitamin D3 at 2000 units per day, indefinitely. 2)  Start monthly B12 shots in the office until next seen.  Carlis Blanchard to please inform pt, I will do rx, and please help start B12 shots monthly

## 2018-09-13 NOTE — Telephone Encounter (Signed)
I have added injection to her chart  Copied from [place] [phone]. Topic: General - Other >> Sep 12, 2018  5:33 PM Erick Blinks wrote: Reason for CRM: Pt received her shingles shot 09/12/2018, wanted to inform PCP

## 2018-09-13 NOTE — Telephone Encounter (Signed)
Pt has been informed of results and expressed understanding.  °

## 2018-09-14 ENCOUNTER — Encounter: Payer: Self-pay | Admitting: Neurology

## 2018-09-14 DIAGNOSIS — M4325 Fusion of spine, thoracolumbar region: Secondary | ICD-10-CM | POA: Diagnosis not present

## 2018-09-14 DIAGNOSIS — M545 Low back pain: Secondary | ICD-10-CM | POA: Diagnosis not present

## 2018-09-15 ENCOUNTER — Encounter: Payer: Self-pay | Admitting: Internal Medicine

## 2018-09-15 DIAGNOSIS — R202 Paresthesia of skin: Secondary | ICD-10-CM | POA: Insufficient documentation

## 2018-09-15 NOTE — Assessment & Plan Note (Signed)

## 2018-09-15 NOTE — Assessment & Plan Note (Signed)
Etiology unclear,, pt requests neurology referral

## 2018-09-15 NOTE — Assessment & Plan Note (Signed)
stable overall by history and exam, recent data reviewed with pt, and pt to continue medical treatment as before,  to f/u any worsening symptoms or concerns  

## 2018-09-15 NOTE — Assessment & Plan Note (Signed)
Also for b12 lab f/u,  to f/u any worsening symptoms or concerns

## 2018-09-15 NOTE — Assessment & Plan Note (Signed)
Acute on chronic, for toradol IM 30, cont PT as she is doing

## 2018-09-17 DIAGNOSIS — R2681 Unsteadiness on feet: Secondary | ICD-10-CM | POA: Diagnosis not present

## 2018-09-17 DIAGNOSIS — M6281 Muscle weakness (generalized): Secondary | ICD-10-CM | POA: Diagnosis not present

## 2018-09-17 DIAGNOSIS — M545 Low back pain: Secondary | ICD-10-CM | POA: Diagnosis not present

## 2018-09-19 ENCOUNTER — Ambulatory Visit (INDEPENDENT_AMBULATORY_CARE_PROVIDER_SITE_OTHER): Payer: Medicare Other

## 2018-09-19 DIAGNOSIS — E538 Deficiency of other specified B group vitamins: Secondary | ICD-10-CM

## 2018-09-19 MED ORDER — CYANOCOBALAMIN 1000 MCG/ML IJ SOLN
1000.0000 ug | Freq: Once | INTRAMUSCULAR | Status: AC
  Administered 2018-09-19: 1000 ug via INTRAMUSCULAR

## 2018-09-19 NOTE — Progress Notes (Signed)
Medical screening examination/treatment/procedure(s) were performed by non-physician practitioner and as supervising physician I was immediately available for consultation/collaboration. I agree with above. James John, MD   

## 2018-09-20 DIAGNOSIS — M545 Low back pain: Secondary | ICD-10-CM | POA: Diagnosis not present

## 2018-09-20 DIAGNOSIS — R2681 Unsteadiness on feet: Secondary | ICD-10-CM | POA: Diagnosis not present

## 2018-09-20 DIAGNOSIS — M6281 Muscle weakness (generalized): Secondary | ICD-10-CM | POA: Diagnosis not present

## 2018-09-25 DIAGNOSIS — M6281 Muscle weakness (generalized): Secondary | ICD-10-CM | POA: Diagnosis not present

## 2018-09-25 DIAGNOSIS — R2681 Unsteadiness on feet: Secondary | ICD-10-CM | POA: Diagnosis not present

## 2018-09-25 DIAGNOSIS — M545 Low back pain: Secondary | ICD-10-CM | POA: Diagnosis not present

## 2018-09-27 DIAGNOSIS — M545 Low back pain: Secondary | ICD-10-CM | POA: Diagnosis not present

## 2018-09-27 DIAGNOSIS — M6281 Muscle weakness (generalized): Secondary | ICD-10-CM | POA: Diagnosis not present

## 2018-09-27 DIAGNOSIS — R2681 Unsteadiness on feet: Secondary | ICD-10-CM | POA: Diagnosis not present

## 2018-10-03 DIAGNOSIS — M6281 Muscle weakness (generalized): Secondary | ICD-10-CM | POA: Diagnosis not present

## 2018-10-03 DIAGNOSIS — M545 Low back pain: Secondary | ICD-10-CM | POA: Diagnosis not present

## 2018-10-03 DIAGNOSIS — R2681 Unsteadiness on feet: Secondary | ICD-10-CM | POA: Diagnosis not present

## 2018-10-09 DIAGNOSIS — R2681 Unsteadiness on feet: Secondary | ICD-10-CM | POA: Diagnosis not present

## 2018-10-09 DIAGNOSIS — M545 Low back pain: Secondary | ICD-10-CM | POA: Diagnosis not present

## 2018-10-09 DIAGNOSIS — M6281 Muscle weakness (generalized): Secondary | ICD-10-CM | POA: Diagnosis not present

## 2018-10-15 DIAGNOSIS — G894 Chronic pain syndrome: Secondary | ICD-10-CM | POA: Diagnosis not present

## 2018-10-15 DIAGNOSIS — Z79899 Other long term (current) drug therapy: Secondary | ICD-10-CM | POA: Diagnosis not present

## 2018-10-15 DIAGNOSIS — M47896 Other spondylosis, lumbar region: Secondary | ICD-10-CM | POA: Diagnosis not present

## 2018-10-15 DIAGNOSIS — Z79891 Long term (current) use of opiate analgesic: Secondary | ICD-10-CM | POA: Diagnosis not present

## 2018-10-15 DIAGNOSIS — M545 Low back pain: Secondary | ICD-10-CM | POA: Diagnosis not present

## 2018-10-15 DIAGNOSIS — M4325 Fusion of spine, thoracolumbar region: Secondary | ICD-10-CM | POA: Diagnosis not present

## 2018-10-15 DIAGNOSIS — M7918 Myalgia, other site: Secondary | ICD-10-CM | POA: Diagnosis not present

## 2018-10-16 ENCOUNTER — Encounter: Payer: Self-pay | Admitting: Neurology

## 2018-10-16 DIAGNOSIS — R2681 Unsteadiness on feet: Secondary | ICD-10-CM | POA: Diagnosis not present

## 2018-10-16 DIAGNOSIS — M6281 Muscle weakness (generalized): Secondary | ICD-10-CM | POA: Diagnosis not present

## 2018-10-16 DIAGNOSIS — M545 Low back pain: Secondary | ICD-10-CM | POA: Diagnosis not present

## 2018-10-19 ENCOUNTER — Ambulatory Visit (INDEPENDENT_AMBULATORY_CARE_PROVIDER_SITE_OTHER): Payer: Medicare Other

## 2018-10-19 DIAGNOSIS — E538 Deficiency of other specified B group vitamins: Secondary | ICD-10-CM

## 2018-10-19 MED ORDER — CYANOCOBALAMIN 1000 MCG/ML IJ SOLN
1000.0000 ug | Freq: Once | INTRAMUSCULAR | Status: AC
  Administered 2018-10-19: 1000 ug via INTRAMUSCULAR

## 2018-10-19 NOTE — Progress Notes (Signed)
Medical screening examination/treatment/procedure(s) were performed by non-physician practitioner and as supervising physician I was immediately available for consultation/collaboration. I agree with above. Jafar Poffenberger, MD   

## 2018-10-22 ENCOUNTER — Encounter: Payer: Self-pay | Admitting: Neurology

## 2018-10-22 ENCOUNTER — Other Ambulatory Visit: Payer: Self-pay

## 2018-10-22 ENCOUNTER — Ambulatory Visit: Payer: Medicare Other | Admitting: Neurology

## 2018-10-22 VITALS — BP 110/60 | HR 74 | Ht 66.0 in | Wt 224.0 lb

## 2018-10-22 DIAGNOSIS — G5601 Carpal tunnel syndrome, right upper limb: Secondary | ICD-10-CM | POA: Diagnosis not present

## 2018-10-22 NOTE — Patient Instructions (Addendum)
Start using a wrist splint for the right hand  I recommend nerve testing of the right hand.  Please call the office at [phone], if you would like to schedule this  ELECTROMYOGRAM AND NERVE CONDUCTION STUDIES (EMG/NCS) INSTRUCTIONS  How to Prepare The neurologist conducting the EMG will need to know if you have certain medical conditions. Tell the neurologist and other EMG lab personnel if you: . Have a pacemaker or any other electrical medical device . Take blood-thinning medications . Have hemophilia, a blood-clotting disorder that causes prolonged bleeding Bathing Take a shower or bath shortly before your exam in order to remove oils from your skin. Don't apply lotions or creams before the exam.  What to Expect You'll likely be asked to change into a hospital gown for the procedure and lie down on an examination table. The following explanations can help you understand what will happen during the exam.  . Electrodes. The neurologist or a technician places surface electrodes at various locations on your skin depending on where you're experiencing symptoms. Or the neurologist may insert needle electrodes at different sites depending on your symptoms.  . Sensations. The electrodes will at times transmit a tiny electrical current that you may feel as a twinge or spasm. The needle electrode may cause discomfort or pain that usually ends shortly after the needle is removed. If you are concerned about discomfort or pain, you may want to talk to the neurologist about taking a short break during the exam.  . Instructions. During the needle EMG, the neurologist will assess whether there is any spontaneous electrical activity when the muscle is at rest - activity that isn't present in healthy muscle tissue - and the degree of activity when you slightly contract the muscle.  He or she will give you instructions on resting and contracting a muscle at appropriate times. Depending on what muscles and  nerves the neurologist is examining, he or she may ask you to change positions during the exam.  After your EMG You may experience some temporary, minor bruising where the needle electrode was inserted into your muscle. This bruising should fade within several days. If it persists, contact your primary care doctor.

## 2018-10-22 NOTE — Progress Notes (Signed)
[facility] Clinic Note - Initial Visit   Date: 10/22/18  [name] MRN: [id number] DOB: [birth year]   Dear Dr. [name]:  Thank you for your kind referral of [name] for consultation of right hand numbness. Although her history is well known to you, please allow [place] to reiterate it for the purpose of our medical record. The patient was accompanied to the clinic by self.    History of Present Illness: [name] is a 73 y.o. right-handed female with diabetes mellitus, CAD, anxiety/depression, hypertension, and hyperlipidemia presenting for evaluation of right hand numbness.   Starting around early 2020, she began having numbness over the thumb, index finger, at middle finger.  Symptoms are constant.  Nothing which exacerbates or alleviates her symptoms.  She has problems with grip and fine motor tasks.   No neck pain.  It does not wake her up from sleeping, but she has learned to sleep with the arm extended so it does not fall asleep.  She has intermittent similar symptoms on the left hand, but nothing as noticeable as the right.  She worked in Doctor, hospital at [employer].   Out-side paper records, electronic medical record, and images have been reviewed where available and summarized as:  Lab Results  Component Value Date   HGBA1C 7.0 (H) 09/12/2018   Lab Results  Component Value Date   VITAMINB12 156 (L) 09/12/2018   Lab Results  Component Value Date   TSH 1.11 09/12/2018     Past Medical History:  Diagnosis Date  . AAA (abdominal aortic aneurysm) ([place])    a. s/p stent graft repair 01/2016.  [name] Allergic rhinitis, cause unspecified   . Anxiety state, unspecified   . Chronic LBP   . Coronary artery disease    a. inferior MI 1998 s/p PCI of RCA. b. stenting of Cx 04/2010. c. DES to prox LAD 05/2013. d. DES x 3 in 10/2015. // Myoview 07/2018:  EF 59, normal perfusion, low risk   . Degeneration of lumbar or lumbosacral intervertebral  disc   . Depressive disorder, not elsewhere classified   . Diabetes mellitus    TYPE II  . Gout 08/22/2013  . Hyperlipidemia   . Hypertension   . Hypothyroidism   . Lower GI bleed 06/2010   Diverticular bleed  . Noncompliance with medications 02/26/2014  . Obesity, unspecified     Past Surgical History:  Procedure Laterality Date  . CARDIAC CATHETERIZATION     PCI OF BOTH THE CIRCUMFLEX AND LEFT ANTERIOR DESCENDING ARTERY  . CARDIAC CATHETERIZATION N/A 10/29/2015   Procedure: Coronary Stent Intervention;  Surgeon: Nelva Bush, MD;  Location: [facility];  Service: Cardiovascular;  Laterality: N/A;  . CARDIAC CATHETERIZATION N/A 10/29/2015   Procedure: Coronary/Graft Angiography;  Surgeon: Nelva Bush, MD;  Location: [facility];  Service: Cardiovascular;  Laterality: N/A;  . CARDIAC CATHETERIZATION N/A 10/29/2015   Procedure: Intravascular Pressure Wire/FFR Study;  Surgeon: Nelva Bush, MD;  Location: [facility];  Service: Cardiovascular;  Laterality: N/A;  . CESAREAN SECTION    . ENDOVASCULAR STENT INSERTION N/A 01/22/2016   Procedure: ABDOMINAL AORTIC ENDOVASCULAR STENT GRAFT INSERTION;  Surgeon: Rosetta Posner, MD;  Location: [facility];  Service: Vascular;  Laterality: N/A;  . HEART STENT  04-2010  and  Jun 07, 2013   X 3  . LEFT HEART CATHETERIZATION WITH CORONARY ANGIOGRAM N/A 06/07/2013   Procedure: LEFT HEART CATHETERIZATION WITH CORONARY ANGIOGRAM;  Surgeon: [name], MD;  Location: [facility] LAB;  Service: Cardiovascular;  Laterality: N/A;  . LEFT HEART CATHETERIZATION WITH CORONARY ANGIOGRAM N/A 02/25/2014   Procedure: LEFT HEART CATHETERIZATION WITH CORONARY ANGIOGRAM;  Surgeon: Troy Sine, MD;  Location: [facility] CATH LAB;  Service: Cardiovascular;  Laterality: N/A;  . LUMBAR FUSION  01/2007   DR. Patrice Paradise...3-LEVEL WITH FIXATION  . OOPHORECTOMY     BSO? pt.unsure  . PARATHYROIDECTOMY    . SPINE SURGERY    . THYROIDECTOMY    . TOTAL  ABDOMINAL HYSTERECTOMY       Medications:  Outpatient Encounter Medications as of 10/22/2018  Medication Sig  . allopurinol (ZYLOPRIM) 300 MG tablet Take 1 tablet (300 mg total) by mouth daily.  [name] aspirin 81 MG EC tablet Take 81 mg by mouth daily.   [name] atenolol (TENORMIN) 25 MG tablet TAKE 1 TABLET(25 MG) BY MOUTH TWICE DAILY  . atorvastatin (LIPITOR) 20 MG tablet Take 1 tablet (20 mg total) by mouth daily.  . celecoxib (CELEBREX) 200 MG capsule as needed.  . fexofenadine (ALLEGRA) 180 MG tablet Take 1 tablet (180 mg total) by mouth daily.  [name] gabapentin (NEURONTIN) 300 MG capsule 1-2 tab by mouth at bedtime  . hydrochlorothiazide (MICROZIDE) 12.5 MG capsule Take 1 capsule (12.5 mg total) by mouth daily.  [name] lactulose (CHRONULAC) 10 GM/15ML solution Take 45 mLs (30 g total) by mouth 2 (two) times daily as needed for mild constipation.  [name] loratadine (CLARITIN) 10 MG tablet Take 10 mg by mouth daily.  . metFORMIN (GLUCOPHAGE-XR) 500 MG 24 hr tablet TAKE 4 TABLETS BY MOUTH EVERY MORNING (Patient taking differently: TAKes one tab on occasion)  . methocarbamol (ROBAXIN) 500 MG tablet   . nitroGLYCERIN (NITROSTAT) 0.4 MG SL tablet Place 1 tablet (0.4 mg total) under the tongue every 5 (five) minutes as needed for chest pain.  [name] oxyCODONE (OXY IR/ROXICODONE) 5 MG immediate release tablet 4 (four) times daily as needed.  . pantoprazole (PROTONIX) 40 MG tablet Take 40 mg by mouth daily.  [name] PLAVIX 75 MG tablet TAKE 1 TABLET BY MOUTH EVERY DAY  . SYNTHROID 150 MCG tablet TAKE 1 TABLET(150 MCG) BY MOUTH DAILY  . tiZANidine (ZANAFLEX) 2 MG tablet Take 1 tablet (2 mg total) by mouth every 6 (six) hours as needed for muscle spasms.  [name] triamcinolone (NASACORT) 55 MCG/ACT AERO nasal inhaler Place 2 sprays into the nose daily.  . vitamin B-12 1000 MCG tablet Take 1 tablet (1,000 mcg total) by mouth daily.  . Vitamin D, Ergocalciferol, (DRISDOL) 1.25 MG (50000 UT) CAPS capsule Take 1 capsule (50,000 Units total)  by mouth every 7 (seven) days.   No facility-administered encounter medications on file as of 10/22/2018.     Allergies:  Allergies  Allergen Reactions  . Prilosec [Omeprazole] Other (See Comments)    Chest pain  . Miconazole Nitrate Hives    REACTION: hives  . Crestor [Rosuvastatin Calcium] Other (See Comments)    Feeling poor  . Augmentin [Amoxicillin-Pot Clavulanate] Hives, Itching and Rash    Has patient had a PCN reaction causing immediate rash, facial/tongue/throat swelling, SOB or lightheadedness with hypotension:unsure Has patient had a PCN reaction causing severe rash involving mucus membranes or skin necrosis:unsure Has patient had a PCN reaction that required hospitalization:No Has patient had a PCN reaction occurring within the last 10 years:NO If all of the above answers are "NO", then may proceed with Cephalosporin use. Has patient had a PCN reaction causing immediate  rash, facial/tongue/throat swelling, SOB or lightheadedness with hypotension:unsure Has patient had a PCN reaction causing severe rash involving mucus membranes or skin necrosis:unsure Has patient had a PCN reaction that required hospitalization:No Has patient had a PCN reaction occurring within the last 10 years:NO If all of the above answers are "NO", then may proceed with Cephalosporin use.   [name] Doxycycline Other (See Comments)    REACTION: gi upset    Family History: Family History  Problem Relation Age of Onset  . Heart attack Mother 23       s/p D&C-CARDIAC ARREST 1966  . Heart disease Mother   . Heart attack Father 63       1978 WITH MI  . Heart disease Father   . Diabetes Brother   . Diabetes Maternal Aunt   . Arthritis Maternal Aunt   . Breast cancer Maternal Aunt        Post menopausal  . Colon cancer Neg Hx     Social History: Social History   Tobacco Use  . Smoking status: Former Smoker    Years: 30.00    Types: Cigarettes    Quit date: 05/31/1986    Years since quitting:  32.4  . Smokeless tobacco: Never Used  Substance Use Topics  . Alcohol use: No  . Drug use: No   Social History   Social History Narrative   DIVORCED   3 CHILDREN   PATIENT SIGNED A DESIGNATED PARTY RELEASE TO ALLOW HER DAUGHTER, TRAMAINE Osterman, TO HAVE ACCESS TO HER MEDICAL RECORDS/INFORMATION. [name], May 04, 2009 @ 3:27 PM   Right handed   One story home   .    Review of Systems:  CONSTITUTIONAL: No fevers, chills, night sweats, or weight loss.   EYES: No visual changes or eye pain ENT: No hearing changes.  No history of nose bleeds.   RESPIRATORY: No cough, wheezing and shortness of breath.   CARDIOVASCULAR: Negative for chest pain, and palpitations.   GI: Negative for abdominal discomfort, blood in stools or black stools.  No recent change in bowel habits.   GU:  No history of incontinence.   MUSCLOSKELETAL: No history of joint pain or swelling.  No myalgias.   SKIN: Negative for lesions, rash, and itching.   HEMATOLOGY/ONCOLOGY: Negative for prolonged bleeding, bruising easily, and swollen nodes.  No history of cancer.   ENDOCRINE: Negative for cold or heat intolerance, polydipsia or goiter.   PSYCH:  No depression or anxiety symptoms.   NEURO: As Above.   Vital Signs:  BP 110/60   Pulse 74   Ht 5\' 6"  (1.676 m)   Wt 224 lb (101.6 kg)   SpO2 98%   BMI 36.15 kg/m    General Medical Exam:   General:  Well appearing, comfortable.   Eyes/ENT: see cranial nerve examination.   Neck:   No carotid bruits. Respiratory:  Clear to auscultation, good air entry bilaterally.   Cardiac:  Regular rate and rhythm, no murmur.   Extremities:  No deformities, edema, or skin discoloration.  Skin:  No rashes or lesions.  Neurological Exam: MENTAL STATUS including orientation to time, place, person, recent and remote memory, attention span and concentration, language, and fund of knowledge is normal.  Speech is not dysarthric.  CRANIAL NERVES: II:  No visual field  defects.    III-IV-VI: Pupils equal round and reactive to light.  Normal conjugate, extra-ocular eye movements in all directions of gaze.  No nystagmus.  No ptosis.  V:  Normal facial sensation.    VII:  Normal facial symmetry and movements.   VIII:  Normal hearing and vestibular function.   IX-X:  Normal palatal movement.   XI:  Normal shoulder shrug and head rotation.   XII:  Normal tongue strength and range of motion, no deviation or fasciculation.  MOTOR: Prominent atrophy of the right APB muscle.  No fasciculations or abnormal movements.  No pronator drift.   Upper Extremity:  Right  Left  Deltoid  5/5   5/5   Biceps  5/5   5/5   Triceps  5/5   5/5   Infraspinatus 5/5  5/5  Medial pectoralis 5/5  5/5  Wrist extensors  5/5   5/5   Wrist flexors  5/5   5/5   Finger extensors  5/5   5/5   Finger flexors  5/5   5/5   Dorsal interossei  5/5   5/5   Abductor pollicis  4-/5   5/5   Tone (Ashworth scale)  0  0   Lower Extremity:  Right  Left  Hip flexors  5/5   5/5   Hip extensors  5/5   5/5   Adductor 5/5  5/5  Abductor 5/5  5/5  Knee flexors  5/5   5/5   Knee extensors  5/5   5/5   Dorsiflexors  5/5   5/5   Plantarflexors  5/5   5/5   Toe extensors  5/5   5/5   Toe flexors  5/5   5/5   Tone (Ashworth scale)  0  0   MSRs:  Right        Left                  brachioradialis 2+  2+  biceps 2+  2+  triceps 2+  2+  patellar 2+  2+  ankle jerk 2+  2+  Hoffman no  no  plantar response down  down   SENSORY: Reduced sensation over the median sensory distribution on the right hand.  Sensation is intact in the left hand and lower extremities.    COORDINATION/GAIT: Normal finger-to- nose-finger.  Intact rapid alternating movements bilaterally.  Gait appears mildly antalgic, assisted with cane  IMPRESSION: Right carpal tunnel syndrome, at least moderate given weakness and muscle atrophy of the APB.  Her sensory complaints are consistent with sensory distribution of the median  nerve.  I offered electrodiagnostic testing to further localize and assess severity of symptoms, which she would like to think about it.  If she chooses to proceed with testing, she will contact my office to schedule..  In the meantime, I recommend that she start using a wrist splint at nighttime.   Thank you for allowing me to participate in patient's care.  If I can answer any additional questions, I would be pleased to do so.    Sincerely,     K. Posey Pronto, DO

## 2018-10-24 ENCOUNTER — Encounter: Payer: Self-pay | Admitting: Gynecology

## 2018-11-02 ENCOUNTER — Other Ambulatory Visit: Payer: Self-pay | Admitting: Physician Assistant

## 2018-11-02 DIAGNOSIS — I2510 Atherosclerotic heart disease of native coronary artery without angina pectoris: Secondary | ICD-10-CM

## 2018-11-02 MED ORDER — PLAVIX 75 MG PO TABS: 75.0000 mg | ORAL_TABLET | Freq: Every day | ORAL | 2 refills | Status: DC

## 2018-11-02 NOTE — Telephone Encounter (Signed)
Pt's medication was sent to pt's pharmacy as requested. Confirmation received.  °

## 2018-11-08 ENCOUNTER — Other Ambulatory Visit: Payer: Medicare Other | Admitting: *Deleted

## 2018-11-08 ENCOUNTER — Other Ambulatory Visit: Payer: Self-pay

## 2018-11-08 DIAGNOSIS — I10 Essential (primary) hypertension: Secondary | ICD-10-CM

## 2018-11-08 DIAGNOSIS — I25119 Atherosclerotic heart disease of native coronary artery with unspecified angina pectoris: Secondary | ICD-10-CM | POA: Diagnosis not present

## 2018-11-08 DIAGNOSIS — E782 Mixed hyperlipidemia: Secondary | ICD-10-CM

## 2018-11-09 ENCOUNTER — Other Ambulatory Visit: Payer: Self-pay

## 2018-11-09 ENCOUNTER — Telehealth: Payer: Self-pay | Admitting: *Deleted

## 2018-11-09 DIAGNOSIS — I2510 Atherosclerotic heart disease of native coronary artery without angina pectoris: Secondary | ICD-10-CM

## 2018-11-09 LAB — BASIC METABOLIC PANEL
BUN/Creatinine Ratio: 11 — ABNORMAL LOW (ref 12–28)
BUN: 9 mg/dL (ref 8–27)
CO2: 25 mmol/L (ref 20–29)
Calcium: 8.6 mg/dL — ABNORMAL LOW (ref 8.7–10.3)
Chloride: 102 mmol/L (ref 96–106)
Creatinine, Ser: 0.82 mg/dL (ref 0.57–1.00)
GFR calc Af Amer: 82 mL/min/{1.73_m2} (ref >59)
GFR calc non Af Amer: 71 mL/min/{1.73_m2} (ref >59)
Glucose: 138 mg/dL — ABNORMAL HIGH (ref 65–99)
Potassium: 4.5 mmol/L (ref 3.5–5.2)
Sodium: 140 mmol/L (ref 134–144)

## 2018-11-09 LAB — HEPATIC FUNCTION PANEL
ALT: 17 IU/L (ref 0–32)
AST: 21 IU/L (ref 0–40)
Albumin: 3.9 g/dL (ref 3.7–4.7)
Alkaline Phosphatase: 123 IU/L — ABNORMAL HIGH (ref 39–117)
Bilirubin Total: 0.6 mg/dL (ref 0.0–1.2)
Bilirubin, Direct: 0.23 mg/dL (ref 0.00–0.40)
Total Protein: 7.3 g/dL (ref 6.0–8.5)

## 2018-11-09 LAB — LIPID PANEL
Chol/HDL Ratio: 3.1 ratio (ref 0.0–4.4)
Cholesterol, Total: 116 mg/dL (ref 100–199)
HDL: 38 mg/dL — ABNORMAL LOW (ref >39)
LDL Chol Calc (NIH): 63 mg/dL (ref 0–99)
Triglycerides: 74 mg/dL (ref 0–149)
VLDL Cholesterol Cal: 15 mg/dL (ref 5–40)

## 2018-11-09 MED ORDER — PLAVIX 75 MG PO TABS: 75.0000 mg | ORAL_TABLET | Freq: Every day | ORAL | 3 refills | Status: DC

## 2018-11-09 MED ORDER — PLAVIX 75 MG PO TABS: 75.0000 mg | ORAL_TABLET | Freq: Every day | ORAL | 2 refills | Status: DC

## 2018-11-09 NOTE — Telephone Encounter (Signed)
Per Richardson Dopp, PAC [facility] [address] [facility] as [place], neither have brand name Plavix. [facility] [place] [facility] [address] [facility] brand name Plavix. I s/w [address] [facility] to confirm [facility]ix in stock. I will send over new Rx for Plavix brand name 75 mg daily # 90 x 3 to the [facility] [place] location. I will inform the pt where to pick up Rx.   I have just s/w pt and informed her that we did find 1 pharmacy that carries the brand name Plavix. Pt aware Rx was [facility] [address] [place]. Pt thanked me for the call and my help.

## 2018-11-09 NOTE — Telephone Encounter (Signed)
Please see if [facility] [address] or [facility] [place] can get it for her and send Rx there. She should make sure she continues on ASA while she is off of Plavix. Richardson Dopp, PA-C    11/09/2018 1:24 PM

## 2018-11-09 NOTE — Telephone Encounter (Signed)
Pt notified of lab results by phone with verbal understanding. Pt advised to f/u with PCP in regards to low Calcium level.Pt advised to continue to work on diet and exercise to help increase her HDL. Pt is agreeable to plan of care. Pt also states to me that she has not taken her Plavix x 2-3 weeks as she states the pharmacy cannot get the brand name in and that she only takes the brand name Plavix. I asked her why she could not take the generic clopidogrel. Pt answered it does not work. Pt denies any reaction, ie, rash, hives, swelling from clopidogrel. She only wants brand name. She wants to know would Richardson Dopp, [facility] like to change her to another medication instead of Plavix. I assured the pt that I will send a message to the PA and if her changes the Rx we will call her if not we will send in the Plavix. Pt thanked me for the call. Patient notified of result.  Please refer to phone note from today for complete details.   Julaine Hua, [place] 11/09/2018 11:16 AM

## 2018-11-09 NOTE — Telephone Encounter (Signed)
Thank you! Richardson Dopp, PA-C    11/09/2018 2:00 PM

## 2018-11-12 ENCOUNTER — Telehealth: Payer: Self-pay

## 2018-11-12 NOTE — Telephone Encounter (Signed)
I have started a name brand Plavix PA through covermymeds. Key: [id number]

## 2018-11-13 NOTE — Telephone Encounter (Signed)
**Note De-Identified [name] Obfuscation** Letter received [name] fax from [employer] stating that they have approved the pts name brand Plavix PA. Approval good until 01/17/2020 Reference # [id number]  [facility].

## 2018-11-14 ENCOUNTER — Other Ambulatory Visit: Payer: Self-pay | Admitting: *Deleted

## 2018-11-14 DIAGNOSIS — I2510 Atherosclerotic heart disease of native coronary artery without angina pectoris: Secondary | ICD-10-CM

## 2018-11-19 ENCOUNTER — Ambulatory Visit: Payer: Medicare Other | Admitting: Neurology

## 2018-11-19 ENCOUNTER — Ambulatory Visit (INDEPENDENT_AMBULATORY_CARE_PROVIDER_SITE_OTHER): Payer: Medicare Other | Admitting: Internal Medicine

## 2018-11-19 ENCOUNTER — Encounter: Payer: Self-pay | Admitting: Internal Medicine

## 2018-11-19 ENCOUNTER — Other Ambulatory Visit: Payer: Self-pay

## 2018-11-19 ENCOUNTER — Ambulatory Visit: Payer: Medicare Other

## 2018-11-19 VITALS — BP 120/70 | HR 88 | Temp 98.4°F | Ht 66.0 in | Wt 234.0 lb

## 2018-11-19 DIAGNOSIS — R21 Rash and other nonspecific skin eruption: Secondary | ICD-10-CM

## 2018-11-19 DIAGNOSIS — I10 Essential (primary) hypertension: Secondary | ICD-10-CM | POA: Diagnosis not present

## 2018-11-19 DIAGNOSIS — E538 Deficiency of other specified B group vitamins: Secondary | ICD-10-CM | POA: Diagnosis not present

## 2018-11-19 DIAGNOSIS — M519 Unspecified thoracic, thoracolumbar and lumbosacral intervertebral disc disorder: Secondary | ICD-10-CM

## 2018-11-19 DIAGNOSIS — E11311 Type 2 diabetes mellitus with unspecified diabetic retinopathy with macular edema: Secondary | ICD-10-CM

## 2018-11-19 DIAGNOSIS — M48061 Spinal stenosis, lumbar region without neurogenic claudication: Secondary | ICD-10-CM | POA: Diagnosis not present

## 2018-11-19 HISTORY — DX: Unspecified thoracic, thoracolumbar and lumbosacral intervertebral disc disorder: M51.9

## 2018-11-19 MED ORDER — CYANOCOBALAMIN 1000 MCG/ML IJ SOLN
1000.0000 ug | Freq: Once | INTRAMUSCULAR | Status: AC
  Administered 2018-11-19: 1000 ug via INTRAMUSCULAR

## 2018-11-19 MED ORDER — GABAPENTIN 300 MG PO CAPS: ORAL_CAPSULE | ORAL | 1 refills | Status: DC

## 2018-11-19 MED ORDER — KETOROLAC TROMETHAMINE 30 MG/ML IJ SOLN
30.0000 mg | Freq: Once | INTRAMUSCULAR | Status: AC
  Administered 2018-11-19: 30 mg via INTRAMUSCULAR

## 2018-11-19 NOTE — Patient Instructions (Addendum)
You had the B12 shot today  You had the Toradol 30 mg shot today  Please follow up Spine and Scoliosis as you have planned  OK to restart the gabapentin at 300 mg - 600 mg at night for pain  Please continue all other medications as before, and refills have been done if requested.  Please have the pharmacy call with any other refills you may need.  Please continue your efforts at being more active, low cholesterol diet, and weight control.  Please keep your appointments with your specialists as you may have planned

## 2018-11-19 NOTE — Progress Notes (Signed)
Subjective:    Patient ID: [name], female    DOB: [birth year], 73 y.o.   MRN: [id number]  HPI  Here to f/u; overall doing ok,  Pt denies chest pain, increasing sob or doe, wheezing, orthopnea, PND, increased LE swelling, palpitations, dizziness or syncope.  Pt denies new neurological symptoms such as new headache, or facial or extremity weakness or numbness.  Pt denies polydipsia, polyuria, or low sugar episode.  Pt states overall good compliance with meds.  Pt does c/o to recurring mid lower thoracic and LBP without bowel or bladder change, fever, wt loss,  wor sening LE pain/numbness/weakness, gait change or falls. Pain about 5/10, asking for toradol that always seems to make her feel well for 2-3 days.  Has not taken gabapentin for over a yr, but as pain is similar, willing to restart this.  Also has a recurrent rash with itching no pain to bilat lateral thighs, happens to be better now, for several months. Past Medical History:  Diagnosis Date   AAA (abdominal aortic aneurysm) ([place])    a. s/p stent graft repair 01/2016.   Allergic rhinitis, cause unspecified    Anxiety state, unspecified    Chronic LBP    Coronary artery disease    a. inferior MI 1998 s/p PCI of RCA. b. stenting of Cx 04/2010. c. DES to prox LAD 05/2013. d. DES x 3 in 10/2015. // Myoview 07/2018:  EF 59, normal perfusion, low risk    Degeneration of lumbar or lumbosacral intervertebral disc    Depressive disorder, not elsewhere classified    Diabetes mellitus    TYPE II   Gout 08/22/2013   Hyperlipidemia    Hypertension    Hypothyroidism    Lower GI bleed 06/2010   Diverticular bleed   Noncompliance with medications 02/26/2014   Obesity, unspecified    Past Surgical History:  Procedure Laterality Date   CARDIAC CATHETERIZATION     PCI OF BOTH THE CIRCUMFLEX AND LEFT ANTERIOR DESCENDING ARTERY   CARDIAC CATHETERIZATION N/A 10/29/2015   Procedure: Coronary Stent Intervention;  Surgeon: Nelva Bush, MD;  Location: [facility];  Service: Cardiovascular;  Laterality: N/A;   CARDIAC CATHETERIZATION N/A 10/29/2015   Procedure: Coronary/Graft Angiography;  Surgeon: Nelva Bush, MD;  Location: [facility];  Service: Cardiovascular;  Laterality: N/A;   CARDIAC CATHETERIZATION N/A 10/29/2015   Procedure: Intravascular Pressure Wire/FFR Study;  Surgeon: Nelva Bush, MD;  Location: [facility];  Service: Cardiovascular;  Laterality: N/A;   CESAREAN SECTION     ENDOVASCULAR STENT INSERTION N/A 01/22/2016   Procedure: ABDOMINAL AORTIC ENDOVASCULAR STENT GRAFT INSERTION;  Surgeon: Rosetta Posner, MD;  Location: [facility];  Service: Vascular;  Laterality: N/A;   HEART STENT  04-2010  and  Jun 07, 2013   X 3   LEFT HEART CATHETERIZATION WITH CORONARY ANGIOGRAM N/A 06/07/2013   Procedure: LEFT HEART CATHETERIZATION WITH CORONARY ANGIOGRAM;  Surgeon: [name], MD;  Location: [facility];  Service: Cardiovascular;  Laterality: N/A;   LEFT HEART CATHETERIZATION WITH CORONARY ANGIOGRAM N/A 02/25/2014   Procedure: LEFT HEART CATHETERIZATION WITH CORONARY ANGIOGRAM;  Surgeon: Troy Sine, MD;  Location: [facility];  Service: Cardiovascular;  Laterality: N/A;   LUMBAR FUSION  01/2007   DR. Patrice Paradise...3-LEVEL WITH FIXATION   OOPHORECTOMY     BSO? pt.unsure   PARATHYROIDECTOMY     SPINE SURGERY     THYROIDECTOMY     TOTAL ABDOMINAL HYSTERECTOMY  reports that she quit smoking about 32 years ago. Her smoking use included cigarettes. She quit after 30.00 years of use. She has never used smokeless tobacco. She reports that she does not drink alcohol or use drugs. family history includes Arthritis in her maternal aunt; Breast cancer in her maternal aunt; Diabetes in her brother and maternal aunt; Heart attack (age of onset: 38) in her mother; Heart attack (age of onset: 59) in her father; Heart disease in her father and mother. Allergies  Allergen Reactions    Prilosec [Omeprazole] Other (See Comments)    Chest pain   Miconazole Nitrate Hives    REACTION: hives   Crestor [Rosuvastatin Calcium] Other (See Comments)    Feeling poor   Augmentin [Amoxicillin-Pot Clavulanate] Hives, Itching and Rash    Has patient had a PCN reaction causing immediate rash, facial/tongue/throat swelling, SOB or lightheadedness with hypotension:unsure Has patient had a PCN reaction causing severe rash involving mucus membranes or skin necrosis:unsure Has patient had a PCN reaction that required hospitalization:No Has patient had a PCN reaction occurring within the last 10 years:NO If all of the above answers are "NO", then may proceed with Cephalosporin use. Has patient had a PCN reaction causing immediate rash, facial/tongue/throat swelling, SOB or lightheadedness with hypotension:unsure Has patient had a PCN reaction causing severe rash involving mucus membranes or skin necrosis:unsure Has patient had a PCN reaction that required hospitalization:No Has patient had a PCN reaction occurring within the last 10 years:NO If all of the above answers are "NO", then may proceed with Cephalosporin use.    Doxycycline Other (See Comments)    REACTION: gi upset   Current Outpatient Medications on File Prior to Visit  Medication Sig Dispense Refill   allopurinol (ZYLOPRIM) 300 MG tablet Take 1 tablet (300 mg total) by mouth daily. 90 tablet 3   aspirin 81 MG EC tablet Take 81 mg by mouth daily.      atenolol (TENORMIN) 25 MG tablet TAKE 1 TABLET(25 MG) BY MOUTH TWICE DAILY 180 tablet 1   atorvastatin (LIPITOR) 20 MG tablet Take 1 tablet (20 mg total) by mouth daily. 90 tablet 3   celecoxib (CELEBREX) 200 MG capsule as needed.     fexofenadine (ALLEGRA) 180 MG tablet Take 1 tablet (180 mg total) by mouth daily. 30 tablet 11   hydrochlorothiazide (MICROZIDE) 12.5 MG capsule Take 1 capsule (12.5 mg total) by mouth daily. 90 capsule 3   lactulose (CHRONULAC) 10  GM/15ML solution Take 45 mLs (30 g total) by mouth 2 (two) times daily as needed for mild constipation. 946 mL 2   loratadine (CLARITIN) 10 MG tablet Take 10 mg by mouth daily.     metFORMIN (GLUCOPHAGE-XR) 500 MG 24 hr tablet TAKE 4 TABLETS BY MOUTH EVERY MORNING (Patient taking differently: TAKes one tab on occasion) 360 tablet 3   methocarbamol (ROBAXIN) 500 MG tablet      nitroGLYCERIN (NITROSTAT) 0.4 MG SL tablet Place 1 tablet (0.4 mg total) under the tongue every 5 (five) minutes as needed for chest pain. 25 tablet 3   oxyCODONE (OXY IR/ROXICODONE) 5 MG immediate release tablet 4 (four) times daily as needed.     pantoprazole (PROTONIX) 40 MG tablet Take 40 mg by mouth daily.     SYNTHROID 150 MCG tablet TAKE 1 TABLET(150 MCG) BY MOUTH DAILY 90 tablet 1   tiZANidine (ZANAFLEX) 2 MG tablet Take 1 tablet (2 mg total) by mouth every 6 (six) hours as needed for muscle spasms. [place]  tablet 1   triamcinolone (NASACORT) 55 MCG/ACT AERO nasal inhaler Place 2 sprays into the nose daily. 1 Inhaler 12   vitamin B-12 1000 MCG tablet Take 1 tablet (1,000 mcg total) by mouth daily.     Vitamin D, Ergocalciferol, (DRISDOL) 1.25 MG (50000 UT) CAPS capsule Take 1 capsule (50,000 Units total) by mouth every 7 (seven) days. 12 capsule 0   No current facility-administered medications on file prior to visit.    Review of Systems  Constitutional: Negative for other unusual diaphoresis or sweats HENT: Negative for ear discharge or swelling Eyes: Negative for other worsening visual disturbances Respiratory: Negative for stridor or other swelling  Gastrointestinal: Negative for worsening distension or other blood Genitourinary: Negative for retention or other urinary change Musculoskeletal: Negative for other MSK pain or swelling Skin: Negative for color change or other new lesions Neurological: Negative for worsening tremors and other numbness  Psychiatric/Behavioral: Negative for worsening agitation  or other fatigue All otherwise neg per pt    Objective:   Physical Exam BP 120/70 (BP Location: Left Arm, Patient Position: Sitting, Cuff Size: Large)    Pulse 88    Temp 98.4 F (36.9 C) (Oral)    Ht 5\' 6"  (1.676 m)    Wt 234 lb (106.1 kg)    SpO2 95%    BMI 37.77 kg/m  VS noted,  Constitutional: Pt appears in NAD HENT: Head: NCAT.  Right Ear: External ear normal.  Left Ear: External ear normal.  Eyes: . Pupils are equal, round, and reactive to light. Conjunctivae and EOM are normal Nose: without d/c or deformity Neck: Neck supple. Gross normal ROM Cardiovascular: Normal rate and regular rhythm.   Pulmonary/Chest: Effort normal and breath sounds without rales or wheezing.  Abd:  Soft, NT, ND, + BS, no organomegaly Spine nontender in midline Neurological: Pt is alert. At baseline orientation, motor grossly intact Skin: Skin is warm. No rashes, other new lesions, no LE edema Psychiatric: Pt behavior is normal without agitation  All otherwise neg per pt Lab Results  Component Value Date   WBC 7.0 09/12/2018   HGB 13.7 09/12/2018   HCT 43.2 09/12/2018   PLT 325.0 09/12/2018   GLUCOSE 138 (H) 11/08/2018   CHOL 116 11/08/2018   TRIG 74 11/08/2018   HDL 38 (L) 11/08/2018   LDLDIRECT 131.0 03/27/2015   LDLCALC 63 11/08/2018   ALT 17 11/08/2018   AST 21 11/08/2018   NA 140 11/08/2018   K 4.5 11/08/2018   CL 102 11/08/2018   CREATININE 0.82 11/08/2018   BUN 9 11/08/2018   CO2 25 11/08/2018   TSH 1.11 09/12/2018   INR 1.08 01/22/2016   HGBA1C 7.0 (H) 09/12/2018   MICROALBUR 1.6 09/12/2018       Assessment & Plan:

## 2018-11-20 DIAGNOSIS — M47894 Other spondylosis, thoracic region: Secondary | ICD-10-CM | POA: Diagnosis not present

## 2018-11-20 DIAGNOSIS — M7918 Myalgia, other site: Secondary | ICD-10-CM | POA: Diagnosis not present

## 2018-11-20 DIAGNOSIS — M4325 Fusion of spine, thoracolumbar region: Secondary | ICD-10-CM | POA: Diagnosis not present

## 2018-11-20 NOTE — Telephone Encounter (Signed)
I started a name brand Plavix tier exception through covermymeds. Key: [id number]

## 2018-11-20 NOTE — Telephone Encounter (Signed)
[facility] [place] calling stating that the pt's Plavix has a higher copay and wanted to know if the pt could get a tier reduction, to reduce the cost. Pharmacy ph# [phone]. Please address

## 2018-11-21 ENCOUNTER — Telehealth: Payer: Self-pay | Admitting: Cardiovascular Disease

## 2018-11-21 NOTE — Telephone Encounter (Signed)
Letter received from [employer] stating that they have denied the pts brand name Plavix tier exception. Reason: "Drugs not on your drug list that have been granted an exception to be covered for you do not qualify for additional lowering of the cost sharing amount".   I have notified the pts pharmacy.

## 2018-11-21 NOTE — Telephone Encounter (Signed)
Routing to Dr. Burt Knack and his primary nurse.

## 2018-11-21 NOTE — Telephone Encounter (Signed)
The patient reports she cannot take generic clopidogrel and the name brand is too expensive. She requests to be switched to a different drug.  She understands she will be called with Dr. Antionette Char recommendations.

## 2018-11-21 NOTE — Telephone Encounter (Addendum)
Christy from [facility] states that the pts daughter wants to know if there is a pt asst program for Plavix. I advised her that since Plavix has a generic form (Clopidogrel) that there is no pt asst program any longer.  Christy asked if there is anyway to lower the cost for the pt for Plavix. I advised her that since the pts plan does not cover Plavix that she could consider switching to a plan that does. Or Consider taking generic form Clopidogrel as it cost much less (it appears that the pt prefers name brand Plavix).  Alyse Low states that she will advise the pts daughter of above info.

## 2018-11-21 NOTE — Telephone Encounter (Signed)
New Message     .Pt c/o medication issue:  1. Name of Medication: Plavix   2. How are you currently taking this medication (dosage and times per day)? Pt is not taking, she is out of medication   3. Are you having a reaction (difficulty breathing--STAT)? No  4. What is your medication issue? Pt is calling because she says she has been without the medication for a couple of weeks now because the cost for it is too expensive She is wondering if she can get started on something else

## 2018-11-22 ENCOUNTER — Encounter: Payer: Self-pay | Admitting: Internal Medicine

## 2018-11-22 DIAGNOSIS — R21 Rash and other nonspecific skin eruption: Secondary | ICD-10-CM | POA: Insufficient documentation

## 2018-11-22 NOTE — Telephone Encounter (Signed)
Informed patient she may take ASA 81 mg daily alone.  She was grateful for assistance.   Med list updated.

## 2018-11-22 NOTE — Assessment & Plan Note (Addendum)
For film, toradol 30 mg IM,  to f/u any worsening symptoms or concerns  Note:  Total time for pt hx, exam, review of record with pt in the room, determination of diagnoses and plan for further eval and tx is > 40 min, with over 50% spent in coordination and counseling of patient including the differential dx, tx, further evaluation and other management of thoracic disc dz, lumbar spinal stenosis, b12 deficiency, Dm, HTN, rash

## 2018-11-22 NOTE — Assessment & Plan Note (Signed)
For b12 replacement 100 gm IM

## 2018-11-22 NOTE — Assessment & Plan Note (Signed)
stable overall by history and exam, recent data reviewed with pt, and pt to continue medical treatment as before,  to f/u any worsening symptoms or concerns  

## 2018-11-22 NOTE — Assessment & Plan Note (Signed)
For triam cr prn, topical benadryl prn itching

## 2018-11-22 NOTE — Addendum Note (Signed)
Addended by: Biagio Borg on: 11/22/2018 09:43 PM   Modules accepted: Level of Service

## 2018-11-22 NOTE — Telephone Encounter (Signed)
Only other options are brilinta or effient which would be more expensive. Ok to just take ASA 81 mg alone.

## 2018-11-22 NOTE — Assessment & Plan Note (Signed)
Ok for gabapentin 300 qhs,  to f/u any worsening symptoms or concerns

## 2018-12-11 DIAGNOSIS — M4325 Fusion of spine, thoracolumbar region: Secondary | ICD-10-CM | POA: Diagnosis not present

## 2018-12-11 DIAGNOSIS — M4010 Other secondary kyphosis, site unspecified: Secondary | ICD-10-CM | POA: Diagnosis not present

## 2018-12-11 DIAGNOSIS — G894 Chronic pain syndrome: Secondary | ICD-10-CM | POA: Diagnosis not present

## 2018-12-11 DIAGNOSIS — M7918 Myalgia, other site: Secondary | ICD-10-CM | POA: Diagnosis not present

## 2018-12-31 ENCOUNTER — Other Ambulatory Visit: Payer: Self-pay | Admitting: Cardiovascular Disease

## 2018-12-31 MED ORDER — NITROGLYCERIN 0.4 MG SL SUBL: 0.4000 mg | SUBLINGUAL_TABLET | SUBLINGUAL | 3 refills | Status: DC | PRN

## 2018-12-31 NOTE — Telephone Encounter (Signed)
New Message     *STAT* If patient is at the pharmacy, call can be transferred to refill team.   1. Which medications need to be refilled? (please list name of each medication and dose if known) nitroGLYCERIN (NITROSTAT) 0.4 MG SL tablet  2. Which pharmacy/location (including street and city if local pharmacy) is medication to be sent to? [place], [place]  3. Do they need a 30 day or 90 day supply? 30 or 90

## 2019-01-15 DIAGNOSIS — M4325 Fusion of spine, thoracolumbar region: Secondary | ICD-10-CM | POA: Diagnosis not present

## 2019-01-15 DIAGNOSIS — G894 Chronic pain syndrome: Secondary | ICD-10-CM | POA: Diagnosis not present

## 2019-01-15 DIAGNOSIS — M545 Low back pain: Secondary | ICD-10-CM | POA: Diagnosis not present

## 2019-01-31 ENCOUNTER — Other Ambulatory Visit: Payer: Self-pay | Admitting: Internal Medicine

## 2019-01-31 NOTE — Telephone Encounter (Signed)
Please refill as per office routine med refill policy (all routine meds refilled for 3 mo or monthly per pt preference up to one year from last visit, then month to month grace period for 3 mo, then further med refills will have to be denied)  

## 2019-02-04 ENCOUNTER — Ambulatory Visit: Payer: Self-pay | Admitting: Gynecology

## 2019-02-11 ENCOUNTER — Ambulatory Visit (INDEPENDENT_AMBULATORY_CARE_PROVIDER_SITE_OTHER): Payer: Medicare HMO | Admitting: Cardiovascular Disease

## 2019-02-11 ENCOUNTER — Other Ambulatory Visit: Payer: Self-pay

## 2019-02-11 ENCOUNTER — Encounter: Payer: Self-pay | Admitting: Cardiovascular Disease

## 2019-02-11 VITALS — BP 110/66 | HR 72 | Ht 66.0 in | Wt 224.8 lb

## 2019-02-11 DIAGNOSIS — I10 Essential (primary) hypertension: Secondary | ICD-10-CM | POA: Diagnosis not present

## 2019-02-11 DIAGNOSIS — E782 Mixed hyperlipidemia: Secondary | ICD-10-CM

## 2019-02-11 DIAGNOSIS — I25118 Atherosclerotic heart disease of native coronary artery with other forms of angina pectoris: Secondary | ICD-10-CM | POA: Diagnosis not present

## 2019-02-11 NOTE — Progress Notes (Signed)
Cardiology Office Note:    Date:  02/11/2019   ID:  [name], DOB 06-Sep-1945, MRN [id number]  PCP:  Biagio Borg, MD  Cardiologist:  Sherren Mocha, MD  Electrophysiologist:  None   Referring MD: Biagio Borg, MD   Chief Complaint  Patient presents with  . Coronary Artery Disease    History of Present Illness:    [name] is a 74 y.o. female with a hx of coronary artery disease, presenting for follow-up evaluation.  The patient initially presented in 1998 with an inferior wall MI treated with stenting of the right coronary artery.  She later underwent stenting of the left circumflex in 2012 and stenting of the LAD in 2015.  In 2017, at the time of her last PCI she was treated with multiple stents in the right coronary artery.  Other cardiovascular-related problems include type 2 diabetes, hypertension, mixed hyperlipidemia, and abdominal aortic aneurysm status post EVAR in 2018.  The patient was last seen in our office in July 2020 and she underwent myocardial perfusion scanning demonstrating no ischemia.  Since that time she has done fairly well.  She did have an episode of chest pain about 3 weeks ago when she had to take 3 sublingual nitroglycerin.  She describes the pain as a pressure-like sensation under the right breast.  This is been typical of her anginal chest pain.  She has had no further problems.  She has not had exertional chest pain of late.  No shortness of breath, orthopnea, or PND.  No heart palpitations, lightheadedness, or syncope.  Past Medical History:  Diagnosis Date  . AAA (abdominal aortic aneurysm) ([place])    a. s/p stent graft repair 01/2016.  [name] Allergic rhinitis, cause unspecified   . Anxiety state, unspecified   . Chronic LBP   . Coronary artery disease    a. inferior MI 1998 s/p PCI of RCA. b. stenting of Cx 04/2010. c. DES to prox LAD 05/2013. d. DES x 3 in 10/2015. // Myoview 07/2018:  EF 59, normal perfusion, low risk   . Degeneration of lumbar or  lumbosacral intervertebral disc   . Depressive disorder, not elsewhere classified   . Diabetes mellitus    TYPE II  . Gout 08/22/2013  . Hyperlipidemia   . Hypertension   . Hypothyroidism   . Lower GI bleed 06/2010   Diverticular bleed  . Noncompliance with medications 02/26/2014  . Obesity, unspecified     Past Surgical History:  Procedure Laterality Date  . CARDIAC CATHETERIZATION     PCI OF BOTH THE CIRCUMFLEX AND LEFT ANTERIOR DESCENDING ARTERY  . CARDIAC CATHETERIZATION N/A 10/29/2015   Procedure: Coronary Stent Intervention;  Surgeon: Nelva Bush, MD;  Location: [place] [facility];  Service: Cardiovascular;  Laterality: N/A;  . CARDIAC CATHETERIZATION N/A 10/29/2015   Procedure: Coronary/Graft Angiography;  Surgeon: Nelva Bush, MD;  Location: [place] [facility];  Service: Cardiovascular;  Laterality: N/A;  . CARDIAC CATHETERIZATION N/A 10/29/2015   Procedure: Intravascular Pressure Wire/FFR Study;  Surgeon: Nelva Bush, MD;  Location: [place] [facility];  Service: Cardiovascular;  Laterality: N/A;  . CESAREAN SECTION    . ENDOVASCULAR STENT INSERTION N/A 01/22/2016   Procedure: ABDOMINAL AORTIC ENDOVASCULAR STENT GRAFT INSERTION;  Surgeon: Rosetta Posner, MD;  Location: [place];  Service: Vascular;  Laterality: N/A;  . HEART STENT  04-2010  and  Jun 07, 2013   X 3  . LEFT HEART CATHETERIZATION WITH CORONARY ANGIOGRAM N/A  06/07/2013   Procedure: LEFT HEART CATHETERIZATION WITH CORONARY ANGIOGRAM;  Surgeon: [name], MD;  Location[facility];  Service: Cardiovascular;  Laterality: N/A;  . LEFT HEART CATHETERIZATION WITH CORONARY ANGIOGRAM N/A 02/25/2014   Procedure: LEFT HEART CATHETERIZATION WITH CORONARY ANGIOGRAM;  Surgeon: Troy Sine, MD;  Location: [facility] CATH LAB;  Service: Cardiovascular;  Laterality: N/A;  . LUMBAR FUSION  01/2007   DR. Patrice Paradise...3-LEVEL WITH FIXATION  . OOPHORECTOMY     BSO? pt.unsure  . PARATHYROIDECTOMY    . SPINE SURGERY    .  THYROIDECTOMY    . TOTAL ABDOMINAL HYSTERECTOMY      Current Medications: Current Meds  Medication Sig  . allopurinol (ZYLOPRIM) 300 MG tablet Take 1 tablet (300 mg total) by mouth daily.  [name] aspirin 81 MG EC tablet Take 81 mg by mouth daily.   [name] atenolol (TENORMIN) 25 MG tablet TAKE 1 TABLET(25 MG) BY MOUTH TWICE DAILY  . atorvastatin (LIPITOR) 20 MG tablet Take 1 tablet (20 mg total) by mouth daily.  . celecoxib (CELEBREX) 200 MG capsule as needed.  . cyclobenzaprine (FLEXERIL) 5 MG tablet Take 5 mg by mouth 3 (three) times daily as needed.  . fexofenadine (ALLEGRA) 180 MG tablet Take 1 tablet (180 mg total) by mouth daily.  [name] gabapentin (NEURONTIN) 300 MG capsule 1-2 tab by mouth at bedtime  . hydrochlorothiazide (MICROZIDE) 12.5 MG capsule Take 1 capsule (12.5 mg total) by mouth daily.  [name] lactulose (CHRONULAC) 10 GM/15ML solution Take 45 mLs (30 g total) by mouth 2 (two) times daily as needed for mild constipation.  [name] loratadine (CLARITIN) 10 MG tablet Take 10 mg by mouth daily.  . metFORMIN (GLUCOPHAGE-XR) 500 MG 24 hr tablet TAKE 4 TABLETS BY MOUTH EVERY MORNING  . methocarbamol (ROBAXIN) 500 MG tablet   . nitroGLYCERIN (NITROSTAT) 0.4 MG SL tablet Place 1 tablet (0.4 mg total) under the tongue every 5 (five) minutes as needed for chest pain.  [name] oxyCODONE (OXY IR/ROXICODONE) 5 MG immediate release tablet 4 (four) times daily as needed.  . pantoprazole (PROTONIX) 40 MG tablet Take 40 mg by mouth daily.  . pregabalin (LYRICA) 75 MG capsule Take 75 mg by mouth as needed.  [name] SYNTHROID 150 MCG tablet TAKE 1 TABLET(150 MCG) BY MOUTH DAILY  . tiZANidine (ZANAFLEX) 2 MG tablet Take 1 tablet (2 mg total) by mouth every 6 (six) hours as needed for muscle spasms.  [name] triamcinolone (NASACORT) 55 MCG/ACT AERO nasal inhaler Place 2 sprays into the nose daily.  . vitamin B-12 1000 MCG tablet Take 1 tablet (1,000 mcg total) by mouth daily.  . Vitamin D, Ergocalciferol, (DRISDOL) 1.25 MG (50000 UT)  CAPS capsule Take 1 capsule (50,000 Units total) by mouth every 7 (seven) days.     Allergies:   Prilosec [omeprazole], Miconazole nitrate, Crestor [rosuvastatin calcium], Augmentin [amoxicillin-pot clavulanate], and Doxycycline   Social History   Socioeconomic History  . Marital status: Divorced    Spouse name: Not on file  . Number of children: 3  . Years of education: 37  . Highest education level: Not on file  Occupational History  . Occupation: RETIRED    Employer: [employer]    Comment: ADMIN SUPPORT    Employer: RETIRED  Tobacco Use  . Smoking status: Former Smoker    Years: 30.00    Types: Cigarettes    Quit date: 05/31/1986    Years since quitting: 32.7  . Smokeless tobacco: Never Used  Substance and Sexual Activity  . Alcohol use: No  . Drug use: No  . Sexual activity: Not Currently    Comment: 1st intercourse 74 yo-Fewer than 5 partners  Other Topics Concern  . Not on file  Social History Narrative   DIVORCED   3 CHILDREN   PATIENT SIGNED A DESIGNATED PARTY RELEASE TO ALLOW HER DAUGHTER, TRAMAINE Urton, TO HAVE ACCESS TO HER MEDICAL RECORDS/INFORMATION. [name], May 04, 2009 @ 3:27 PM   Right handed   One story home   .   Social Determinants of Health   Financial Resource Strain:   . Difficulty of Paying Living Expenses: Not on file  Food Insecurity:   . Worried About Charity fundraiser in the Last Year: Not on file  . Ran Out of Food in the Last Year: Not on file  Transportation Needs:   . Lack of Transportation (Medical): Not on file  . Lack of Transportation (Non-Medical): Not on file  Physical Activity:   . Days of Exercise per Week: Not on file  . Minutes of Exercise per Session: Not on file  Stress:   . Feeling of Stress : Not on file  Social Connections:   . Frequency of Communication with Friends and Family: Not on file  . Frequency of Social Gatherings with Friends and Family: Not on file  . Attends Religious Services: Not  on file  . Active Member of Clubs or Organizations: Not on file  . Attends Archivist Meetings: Not on file  . Marital Status: Not on file     Family History: The patient's family history includes Arthritis in her maternal aunt; Breast cancer in her maternal aunt; Diabetes in her brother and maternal aunt; Heart attack (age of onset: 33) in her mother; Heart attack (age of onset: 65) in her father; Heart disease in her father and mother. There is no history of Colon cancer.  ROS:   Please see the history of present illness.    All other systems reviewed and are negative.  EKGs/Labs/Other Studies Reviewed:    The following studies were reviewed today: Myocardial Perfusion Study 08-17-2018: Study Highlights   Electrically negative for ischemia  Normal perfusion  LVEF 59%  Low risk study   Echo 02-09-2018: Study Conclusions  - Left ventricle: The cavity size was normal. Wall thickness was   normal. Systolic function was normal. The estimated ejection   fraction was in the range of 50% to 55%. Wall motion was normal;   there were no regional wall motion abnormalities. Left   ventricular diastolic function parameters were normal. - Aortic valve: Sclerosis without stenosis. - Mitral valve: Moderately calcified annulus. Mildly thickened   leaflets . - Atrial septum: No defect or patent foramen ovale was identified.  EKG:  EKG is not ordered today.    Recent Labs: 09/12/2018: Hemoglobin 13.7; Platelets 325.0; TSH 1.11 11/08/2018: ALT 17; BUN 9; Creatinine, Ser 0.82; Potassium 4.5; Sodium 140  Recent Lipid Panel    Component Value Date/Time   CHOL 116 11/08/2018 1546   TRIG 74 11/08/2018 1546   TRIG 102 01/02/2006 0825   HDL 38 (L) 11/08/2018 1546   CHOLHDL 3.1 11/08/2018 1546   CHOLHDL 5 09/12/2018 1133   VLDL 19.2 09/12/2018 1133   LDLCALC 63 11/08/2018 1546   LDLDIRECT 131.0 03/27/2015 1137    Physical Exam:    VS:  BP 110/66   Pulse 72   Ht 5\' 6"   (1.676 m)  Wt 224 lb 12.8 oz (102 kg)   SpO2 97%   BMI 36.28 kg/m     Wt Readings from Last 3 Encounters:  02/11/19 224 lb 12.8 oz (102 kg)  11/19/18 234 lb (106.1 kg)  10/22/18 224 lb (101.6 kg)     GEN:  Well nourished, well developed in no acute distress HEENT: Normal NECK: No JVD; No carotid bruits LYMPHATICS: No lymphadenopathy CARDIAC: RRR, no murmurs, rubs, gallops RESPIRATORY:  Clear to auscultation without rales, wheezing or rhonchi  ABDOMEN: Soft, non-tender, non-distended MUSCULOSKELETAL:  No edema; No deformity  SKIN: Warm and dry NEUROLOGIC:  Alert and oriented x 3 PSYCHIATRIC:  Normal affect   ASSESSMENT:    1. Coronary artery disease of native artery of native heart with stable angina pectoris ([place])   2. Essential hypertension   3. Mixed hyperlipidemia    PLAN:    In order of problems listed above:  1. Symptoms are stable on aspirin for antiplatelet therapy, atenolol, atorvastatin.  No changes are made in her medical regimen.  I would like her to have an APP follow-up in 6 months and I will see her back in 1 year. 2. Blood pressure is well controlled on current medical therapy.  Most recent labs are reviewed.  Creatinine is normal at 0.82 mg/dL and potassium was 3.9 mg/dL at the time of her most recent lab work. 3. Lipids are at goal with most recent labs showing a cholesterol of 116, HDL 38, and LDL 63.   Medication Adjustments/Labs and Tests Ordered: Current medicines are reviewed at length with the patient today.  Concerns regarding medicines are outlined above.  No orders of the defined types were placed in this encounter.  No orders of the defined types were placed in this encounter.   Patient Instructions  Medication Instructions:  Your provider recommends that you continue on your current medications as directed. Please refer to the Current Medication list given to you today.   *If you need a refill on your cardiac medications before your next  appointment, please call your pharmacy*  Follow-Up: At [facility], you and your health needs are our priority.  As part of our continuing mission to provide you with exceptional heart care, we have created designated Provider Care Teams.  These Care Teams include your primary Cardiologist (physician) and Advanced Practice Providers (APPs -  Physician Assistants and Nurse Practitioners) who all work together to provide you with the care you need, when you need it. Your next appointment:   6 month(s) The format for your next appointment:   In Person Provider:   Richardson Dopp, PA-C     Signed, Sherren Mocha, MD  02/11/2019 1:35 PM    [place]

## 2019-02-11 NOTE — Patient Instructions (Signed)
Medication Instructions:  Your provider recommends that you continue on your current medications as directed. Please refer to the Current Medication list given to you today.   *If you need a refill on your cardiac medications before your next appointment, please call your pharmacy*  Follow-Up: At [facility], you and your health needs are our priority.  As part of our continuing mission to provide you with exceptional heart care, we have created designated Provider Care Teams.  These Care Teams include your primary Cardiologist (physician) and Advanced Practice Providers (APPs -  Physician Assistants and Nurse Practitioners) who all work together to provide you with the care you need, when you need it. Your next appointment:   6 month(s) The format for your next appointment:   In Person Provider:   Scott Weaver, PA-C 

## 2019-02-12 DIAGNOSIS — M4325 Fusion of spine, thoracolumbar region: Secondary | ICD-10-CM | POA: Diagnosis not present

## 2019-02-12 DIAGNOSIS — M545 Low back pain: Secondary | ICD-10-CM | POA: Diagnosis not present

## 2019-02-12 DIAGNOSIS — M7918 Myalgia, other site: Secondary | ICD-10-CM | POA: Diagnosis not present

## 2019-02-12 DIAGNOSIS — G894 Chronic pain syndrome: Secondary | ICD-10-CM | POA: Diagnosis not present

## 2019-02-13 ENCOUNTER — Ambulatory Visit: Payer: Self-pay | Admitting: Obstetrics and Gynecology

## 2019-03-01 ENCOUNTER — Other Ambulatory Visit: Payer: Self-pay

## 2019-03-04 ENCOUNTER — Encounter: Payer: Self-pay | Admitting: Obstetrics and Gynecology

## 2019-03-04 ENCOUNTER — Ambulatory Visit: Payer: Medicare HMO | Admitting: Obstetrics and Gynecology

## 2019-03-04 ENCOUNTER — Other Ambulatory Visit: Payer: Self-pay

## 2019-03-04 VITALS — BP 122/74 | Ht 65.0 in | Wt 225.0 lb

## 2019-03-04 DIAGNOSIS — Z9189 Other specified personal risk factors, not elsewhere classified: Secondary | ICD-10-CM | POA: Diagnosis not present

## 2019-03-04 DIAGNOSIS — M8580 Other specified disorders of bone density and structure, unspecified site: Secondary | ICD-10-CM

## 2019-03-04 DIAGNOSIS — K5900 Constipation, unspecified: Secondary | ICD-10-CM

## 2019-03-04 DIAGNOSIS — Z01419 Encounter for gynecological examination (general) (routine) without abnormal findings: Secondary | ICD-10-CM | POA: Diagnosis not present

## 2019-03-04 DIAGNOSIS — L309 Dermatitis, unspecified: Secondary | ICD-10-CM | POA: Diagnosis not present

## 2019-03-04 MED ORDER — DOCUSATE SODIUM 100 MG PO CAPS: 100.0000 mg | ORAL_CAPSULE | Freq: Two times a day (BID) | ORAL | 1 refills | Status: DC | PRN

## 2019-03-04 MED ORDER — HYDROCORTISONE 1 % EX LOTN: 1.0000 "application " | TOPICAL_LOTION | Freq: Two times a day (BID) | CUTANEOUS | 0 refills | Status: DC

## 2019-03-04 MED ORDER — VITAMIN D (CHOLECALCIFEROL) 25 MCG (1000 UT) PO CAPS: 1.0000 | ORAL_CAPSULE | Freq: Every day | ORAL | 3 refills | Status: DC

## 2019-03-04 NOTE — Progress Notes (Signed)
[name] Mar 21, 1945 [id number]  SUBJECTIVE:  74 y.o. [id number] female for annual routine gynecologic exam and Pap smear. She has no gynecologic concerns.  Noting a slight itchy rash on left leg. No BM in last few days. Not taking a daily vitamin and not taking vitamin D supplement.  Current Outpatient Medications  Medication Sig Dispense Refill  . aspirin 81 MG EC tablet Take 81 mg by mouth daily.     [name] atenolol (TENORMIN) 25 MG tablet TAKE 1 TABLET(25 MG) BY MOUTH TWICE DAILY 180 tablet 1  . cyclobenzaprine (FLEXERIL) 5 MG tablet Take 5 mg by mouth 3 (three) times daily as needed.    . fexofenadine (ALLEGRA) 180 MG tablet Take 1 tablet (180 mg total) by mouth daily. 30 tablet 11  . gabapentin (NEURONTIN) 300 MG capsule 1-2 tab by mouth at bedtime 180 capsule 1  . hydrochlorothiazide (MICROZIDE) 12.5 MG capsule Take 1 capsule (12.5 mg total) by mouth daily. 90 capsule 3  . metFORMIN (GLUCOPHAGE-XR) 500 MG 24 hr tablet TAKE 4 TABLETS BY MOUTH EVERY MORNING 360 tablet 3  . methocarbamol (ROBAXIN) 500 MG tablet     . nitroGLYCERIN (NITROSTAT) 0.4 MG SL tablet Place 1 tablet (0.4 mg total) under the tongue every 5 (five) minutes as needed for chest pain. 25 tablet 3  . oxyCODONE (OXY IR/ROXICODONE) 5 MG immediate release tablet 4 (four) times daily as needed.    . pantoprazole (PROTONIX) 40 MG tablet Take 40 mg by mouth daily.    [name] SYNTHROID 150 MCG tablet TAKE 1 TABLET(150 MCG) BY MOUTH DAILY 90 tablet 1  . tiZANidine (ZANAFLEX) 2 MG tablet Take 1 tablet (2 mg total) by mouth every 6 (six) hours as needed for muscle spasms. 40 tablet 1  . triamcinolone (NASACORT) 55 MCG/ACT AERO nasal inhaler Place 2 sprays into the nose daily. 1 Inhaler 12  . vitamin B-12 1000 MCG tablet Take 1 tablet (1,000 mcg total) by mouth daily.    [name] allopurinol (ZYLOPRIM) 300 MG tablet Take 1 tablet (300 mg total) by mouth daily. (Patient not taking: Reported on 03/04/2019) 90 tablet 3  . atorvastatin (LIPITOR) 20  MG tablet Take 1 tablet (20 mg total) by mouth daily. (Patient not taking: Reported on 03/04/2019) 90 tablet 3  . celecoxib (CELEBREX) 200 MG capsule as needed.    . pregabalin (LYRICA) 75 MG capsule Take 75 mg by mouth as needed.     No current facility-administered medications for this visit.   Allergies: Prilosec [omeprazole], Miconazole nitrate, Crestor [rosuvastatin calcium], Augmentin [amoxicillin-pot clavulanate], and Doxycycline  No LMP recorded. Patient has had a hysterectomy.  Past medical history,surgical history, problem list, medications, allergies, family history and social history were all reviewed and documented as reviewed in the EPIC chart.  ROS:  Feeling well. No dyspnea or chest pain on exertion.  No abdominal pain, change in bowel habits, black or bloody stools.  No urinary tract symptoms. GYN ROS: no abnormal bleeding, pelvic pain or discharge, no breast pain or new or enlarging lumps on self exam. No neurological complaints.    OBJECTIVE:  BP 122/74   Ht 5\' 5"  (1.651 m)   Wt 225 lb (102.1 kg)   BMI 37.44 kg/m  The patient appears well, alert, oriented x 3, in no distress. ENT normal.  Neck supple. No cervical or supraclavicular adenopathy or thyromegaly.  Lungs are clear, good air entry, no wheezes, rhonchi or rales. S1 and S2 normal, no murmurs, regular rate and rhythm.  Abdomen soft without tenderness, guarding, mass or organomegaly.  Neurological is normal, no focal findings.  BREAST EXAM: breasts appear normal, no suspicious masses, no skin or nipple changes or axillary nodes  PELVIC EXAM: VULVA: normal appearing vulva with no masses, tenderness or lesions, atrophic changes noted, VAGINA: normal appearing vagina with normal color and discharge, no lesions, CERVIX: surgically absent, UTERUS: surgically absent, vaginal cuff well healed, ADNEXA: normal adnexa in size, nontender and no masses, RECTAL: normal rectal, no masses.  Left leg with resolved rash and one  area < 1 cm of hyperpigmentation, no excoriation.  Chaperone: [name] present during the examination  ASSESSMENT:  74 y.o. [id number] here for annual gynecologic exam  PLAN:   1. Postmenopausal. No symptoms or concerns. 2. Pap smear 11/2015. Not repeated today. Prior hysterectomy for heavy periods with reportedly benign findings. States no prior history of abnormal Pap smears.  We discussed that at age 63 and beyond, and a prior hysterectomy, we can stop Pap smear screening, and she is comfortable with this and agrees to stop surveillance at this time.   3. Mammogram 03/2018. Will continue with annual mammography. Breast exam normal today. 4. Colonoscopy 2012. Recommended that she continue per the prescribed interval.   5. Osteopenia. DEXA 12/2016.  Followed by her primary care doctor for this.  Does not supplement calcium and vitamin D.  She does get dairy in her diet.  I recommended supplementing with Vitamin D and have her a prescription for 1000 IU daily.  She will plan to get her DEXA repeated this year and says she will discuss timing with her primary doctor. 6. Leg rash.  Likely dermatitis from dry skin.  Rx for hydrocortisone 1% given, apply 2-3 times per day prn, apply skin moisturizer periodically each day. 7. Constipation.  Rx for Colace BID prn. 8. Health maintenance.  No lab work as she has this completed with her primary care provider.   Return annually or sooner, prn.  Joseph Pierini MD  03/04/19

## 2019-03-04 NOTE — Patient Instructions (Addendum)
Please remember to schedule a bone density scan through your primary doctor next year I prescribed you vitamin D to take daily to help with your bone strength Please make sure you are getting calcium in your diet and supplement if needed with Tums or another calcium replacement Your mammogram will be due this year so please member to schedule You can apply the hydrocortisone cream to the rash a itchy area on your left leg 2-3 times a day as needed, if it is not helping please let us know

## 2019-03-05 ENCOUNTER — Other Ambulatory Visit: Payer: Self-pay | Admitting: Internal Medicine

## 2019-03-05 DIAGNOSIS — Z1231 Encounter for screening mammogram for malignant neoplasm of breast: Secondary | ICD-10-CM

## 2019-03-13 DIAGNOSIS — M546 Pain in thoracic spine: Secondary | ICD-10-CM | POA: Diagnosis not present

## 2019-03-13 DIAGNOSIS — G894 Chronic pain syndrome: Secondary | ICD-10-CM | POA: Diagnosis not present

## 2019-03-13 DIAGNOSIS — M4010 Other secondary kyphosis, site unspecified: Secondary | ICD-10-CM | POA: Diagnosis not present

## 2019-03-13 DIAGNOSIS — M7918 Myalgia, other site: Secondary | ICD-10-CM | POA: Diagnosis not present

## 2019-03-13 DIAGNOSIS — M4325 Fusion of spine, thoracolumbar region: Secondary | ICD-10-CM | POA: Diagnosis not present

## 2019-03-14 ENCOUNTER — Ambulatory Visit: Payer: Medicare Other | Admitting: Internal Medicine

## 2019-03-18 ENCOUNTER — Other Ambulatory Visit: Payer: Self-pay

## 2019-03-18 ENCOUNTER — Ambulatory Visit (INDEPENDENT_AMBULATORY_CARE_PROVIDER_SITE_OTHER): Payer: Medicare HMO | Admitting: Internal Medicine

## 2019-03-18 ENCOUNTER — Encounter: Payer: Self-pay | Admitting: Internal Medicine

## 2019-03-18 VITALS — BP 118/72 | HR 56 | Temp 97.8°F | Ht 65.0 in | Wt 223.2 lb

## 2019-03-18 DIAGNOSIS — I10 Essential (primary) hypertension: Secondary | ICD-10-CM

## 2019-03-18 DIAGNOSIS — E782 Mixed hyperlipidemia: Secondary | ICD-10-CM | POA: Diagnosis not present

## 2019-03-18 DIAGNOSIS — G4700 Insomnia, unspecified: Secondary | ICD-10-CM

## 2019-03-18 DIAGNOSIS — E039 Hypothyroidism, unspecified: Secondary | ICD-10-CM

## 2019-03-18 DIAGNOSIS — M545 Low back pain, unspecified: Secondary | ICD-10-CM

## 2019-03-18 DIAGNOSIS — Z0001 Encounter for general adult medical examination with abnormal findings: Secondary | ICD-10-CM | POA: Diagnosis not present

## 2019-03-18 DIAGNOSIS — G8929 Other chronic pain: Secondary | ICD-10-CM | POA: Diagnosis not present

## 2019-03-18 DIAGNOSIS — E11311 Type 2 diabetes mellitus with unspecified diabetic retinopathy with macular edema: Secondary | ICD-10-CM | POA: Diagnosis not present

## 2019-03-18 DIAGNOSIS — E538 Deficiency of other specified B group vitamins: Secondary | ICD-10-CM | POA: Diagnosis not present

## 2019-03-18 LAB — CBC WITH DIFFERENTIAL/PLATELET
Basophils Absolute: 0 10*3/uL (ref 0.0–0.1)
Basophils Relative: 0.4 % (ref 0.0–3.0)
Eosinophils Absolute: 0.1 10*3/uL (ref 0.0–0.7)
Eosinophils Relative: 1 % (ref 0.0–5.0)
HCT: 42.7 % (ref 36.0–46.0)
Hemoglobin: 13.8 g/dL (ref 12.0–15.0)
Lymphocytes Relative: 18.1 % (ref 12.0–46.0)
Lymphs Abs: 1.4 10*3/uL (ref 0.7–4.0)
MCHC: 32.3 g/dL (ref 30.0–36.0)
MCV: 86.8 fl (ref 78.0–100.0)
Monocytes Absolute: 0.5 10*3/uL (ref 0.1–1.0)
Monocytes Relative: 7.1 % (ref 3.0–12.0)
Neutro Abs: 5.5 10*3/uL (ref 1.4–7.7)
Neutrophils Relative %: 73.4 % (ref 43.0–77.0)
Platelets: 298 10*3/uL (ref 150.0–400.0)
RBC: 4.92 Mil/uL (ref 3.87–5.11)
RDW: 16 % — ABNORMAL HIGH (ref 11.5–15.5)
WBC: 7.5 10*3/uL (ref 4.0–10.5)

## 2019-03-18 LAB — BASIC METABOLIC PANEL
BUN: 14 mg/dL (ref 6–23)
CO2: 30 mEq/L (ref 19–32)
Calcium: 8.7 mg/dL (ref 8.4–10.5)
Chloride: 103 mEq/L (ref 96–112)
Creatinine, Ser: 0.8 mg/dL (ref 0.40–1.20)
GFR: 84.94 mL/min (ref >60.00)
Glucose, Bld: 121 mg/dL — ABNORMAL HIGH (ref 70–99)
Potassium: 3.8 mEq/L (ref 3.5–5.1)
Sodium: 141 mEq/L (ref 135–145)

## 2019-03-18 LAB — URINALYSIS, ROUTINE W REFLEX MICROSCOPIC
Bilirubin Urine: NEGATIVE
Ketones, ur: NEGATIVE
Nitrite: NEGATIVE
Specific Gravity, Urine: 1.02 (ref 1.000–1.030)
Total Protein, Urine: NEGATIVE
Urine Glucose: NEGATIVE
Urobilinogen, UA: 1 (ref 0.0–1.0)
pH: 6 (ref 5.0–8.0)

## 2019-03-18 LAB — HEPATIC FUNCTION PANEL
ALT: 12 U/L (ref 0–35)
AST: 18 U/L (ref 0–37)
Albumin: 3.7 g/dL (ref 3.5–5.2)
Alkaline Phosphatase: 100 U/L (ref 39–117)
Bilirubin, Direct: 0.1 mg/dL (ref 0.0–0.3)
Total Bilirubin: 0.6 mg/dL (ref 0.2–1.2)
Total Protein: 7.9 g/dL (ref 6.0–8.3)

## 2019-03-18 LAB — LIPID PANEL
Cholesterol: 152 mg/dL (ref 0–200)
HDL: 28.3 mg/dL — ABNORMAL LOW (ref >39.00)
LDL Cholesterol: 107 mg/dL — ABNORMAL HIGH (ref 0–99)
NonHDL: 123.41
Total CHOL/HDL Ratio: 5
Triglycerides: 80 mg/dL (ref 0.0–149.0)
VLDL: 16 mg/dL (ref 0.0–40.0)

## 2019-03-18 LAB — TSH: TSH: 4.17 u[IU]/mL (ref 0.35–4.50)

## 2019-03-18 LAB — HEMOGLOBIN A1C: Hgb A1c MFr Bld: 6.6 % — ABNORMAL HIGH (ref 4.6–6.5)

## 2019-03-18 MED ORDER — CYANOCOBALAMIN 1000 MCG/ML IJ SOLN
1000.0000 ug | Freq: Once | INTRAMUSCULAR | Status: AC
  Administered 2019-03-18: 1000 ug via INTRAMUSCULAR

## 2019-03-18 MED ORDER — KETOROLAC TROMETHAMINE 30 MG/ML IJ SOLN
30.0000 mg | Freq: Once | INTRAMUSCULAR | Status: AC
  Administered 2019-03-18: 30 mg via INTRAMUSCULAR

## 2019-03-18 MED ORDER — CYANOCOBALAMIN 1000 MCG PO TABS: 1000.0000 ug | ORAL_TABLET | Freq: Every day | ORAL | 3 refills | Status: DC

## 2019-03-18 NOTE — Assessment & Plan Note (Signed)
stable overall by history and exam, recent data reviewed with pt, and pt to continue medical treatment as before,  to f/u any worsening symptoms or concerns  

## 2019-03-18 NOTE — Assessment & Plan Note (Addendum)
Ok for  b12 im 1000 today, then change to oral replacement  I spent 32 minutes in addition to time for wellness examination in preparing to see the patient by review of recent labs, imaging and procedures, obtaining and reviewing separately obtained history, communicating with the patient and family or caregiver, ordering medications, tests or procedures, and documenting clinical information in the EHR including the differential Dx, treatment, and any further evaluation and other management of b12 deficiency, low back pain, hypothyroidism, hld, hTN, DM, insomnia

## 2019-03-18 NOTE — Progress Notes (Signed)
Subjective:    Patient ID: [name], female    DOB: 1945-07-07, 74 y.o.   MRN: [id number]  HPI  Here for wellness and f/u;  Overall doing ok;  Pt denies Chest pain, worsening SOB, DOE, wheezing, orthopnea, PND, worsening LE edema, palpitations, dizziness or syncope.  Pt denies neurological change such as new headache, facial or extremity weakness.  Pt denies polydipsia, polyuria, or low sugar symptoms. Pt states overall good compliance with treatment and medications, good tolerability, and has been trying to follow appropriate diet.  Pt denies worsening depressive symptoms, suicidal ideation or panic. No fever, night sweats, wt loss, loss of appetite, or other constitutional symptoms.  Pt states good ability with ADL's, has low fall risk, home safety reviewed and adequate, no other significant changes in hearing or vision, S/p COVID x 1.  Does c/o ongoing fatigue, but denies signficant daytime hypersomnolence.  Has ongoing pain, asks for toradol im today, also with persisten insomnia worse last few months.  Asks to change the b12 to pill Past Medical History:  Diagnosis Date  . AAA (abdominal aortic aneurysm) ([place])    a. s/p stent graft repair 01/2016.  [name] Allergic rhinitis, cause unspecified   . Anxiety state, unspecified   . Chronic LBP   . Coronary artery disease    a. inferior MI 1998 s/p PCI of RCA. b. stenting of Cx 04/2010. c. DES to prox LAD 05/2013. d. DES x 3 in 10/2015. // Myoview 07/2018:  EF 59, normal perfusion, low risk   . Degeneration of lumbar or lumbosacral intervertebral disc   . Depressive disorder, not elsewhere classified   . Diabetes mellitus    TYPE II  . Gout 08/22/2013  . Hyperlipidemia   . Hypertension   . Hypothyroidism   . Lower GI bleed 06/2010   Diverticular bleed  . Noncompliance with medications 02/26/2014  . Obesity, unspecified    Past Surgical History:  Procedure Laterality Date  . CARDIAC CATHETERIZATION     PCI OF BOTH THE CIRCUMFLEX AND LEFT  ANTERIOR DESCENDING ARTERY  . CARDIAC CATHETERIZATION N/A 10/29/2015   Procedure: Coronary Stent Intervention;  Surgeon: Nelva Bush, MD;  Location: [facility];  Service: Cardiovascular;  Laterality: N/A;  . CARDIAC CATHETERIZATION N/A 10/29/2015   Procedure: Coronary/Graft Angiography;  Surgeon: Nelva Bush, MD;  Location: [facility];  Service: Cardiovascular;  Laterality: N/A;  . CARDIAC CATHETERIZATION N/A 10/29/2015   Procedure: Intravascular Pressure Wire/FFR Study;  Surgeon: Nelva Bush, MD;  Location: [facility];  Service: Cardiovascular;  Laterality: N/A;  . CESAREAN SECTION    . ENDOVASCULAR STENT INSERTION N/A 01/22/2016   Procedure: ABDOMINAL AORTIC ENDOVASCULAR STENT GRAFT INSERTION;  Surgeon: Rosetta Posner, MD;  Location: [facility];  Service: Vascular;  Laterality: N/A;  . HEART STENT  04-2010  and  Jun 07, 2013   X 3  . LEFT HEART CATHETERIZATION WITH CORONARY ANGIOGRAM N/A 06/07/2013   Procedure: LEFT HEART CATHETERIZATION WITH CORONARY ANGIOGRAM;  Surgeon: [name], MD;  Location: [facility];  Service: Cardiovascular;  Laterality: N/A;  . LEFT HEART CATHETERIZATION WITH CORONARY ANGIOGRAM N/A 02/25/2014   Procedure: LEFT HEART CATHETERIZATION WITH CORONARY ANGIOGRAM;  Surgeon: Troy Sine, MD;  Location: [facility] CATH LAB;  Service: Cardiovascular;  Laterality: N/A;  . LUMBAR FUSION  01/2007   DR. Patrice Paradise...3-LEVEL WITH FIXATION  . OOPHORECTOMY     BSO? pt.unsure  . PARATHYROIDECTOMY    . SPINE SURGERY    .  THYROIDECTOMY    . TOTAL ABDOMINAL HYSTERECTOMY      reports that she quit smoking about 32 years ago. Her smoking use included cigarettes. She quit after 30.00 years of use. She has never used smokeless tobacco. She reports that she does not drink alcohol or use drugs. family history includes Arthritis in her maternal aunt; Breast cancer in her maternal aunt; Diabetes in her brother and maternal aunt; Heart attack (age of onset: 78) in  her mother; Heart attack (age of onset: 45) in her father; Heart disease in her father and mother. Allergies  Allergen Reactions  . Prilosec [Omeprazole] Other (See Comments)    Chest pain  . Miconazole Nitrate Hives    REACTION: hives  . Crestor [Rosuvastatin Calcium] Other (See Comments)    Feeling poor  . Augmentin [Amoxicillin-Pot Clavulanate] Hives, Itching and Rash    Has patient had a PCN reaction causing immediate rash, facial/tongue/throat swelling, SOB or lightheadedness with hypotension:unsure Has patient had a PCN reaction causing severe rash involving mucus membranes or skin necrosis:unsure Has patient had a PCN reaction that required hospitalization:No Has patient had a PCN reaction occurring within the last 10 years:NO If all of the above answers are "NO", then may proceed with Cephalosporin use. Has patient had a PCN reaction causing immediate rash, facial/tongue/throat swelling, SOB or lightheadedness with hypotension:unsure Has patient had a PCN reaction causing severe rash involving mucus membranes or skin necrosis:unsure Has patient had a PCN reaction that required hospitalization:No Has patient had a PCN reaction occurring within the last 10 years:NO If all of the above answers are "NO", then may proceed with Cephalosporin use.   [name] Doxycycline Other (See Comments)    REACTION: gi upset   Current Outpatient Medications on File Prior to Visit  Medication Sig Dispense Refill  . allopurinol (ZYLOPRIM) 300 MG tablet Take 1 tablet (300 mg total) by mouth daily. 90 tablet 3  . aspirin 81 MG EC tablet Take 81 mg by mouth daily.     [name] atenolol (TENORMIN) 25 MG tablet TAKE 1 TABLET(25 MG) BY MOUTH TWICE DAILY 180 tablet 1  . atorvastatin (LIPITOR) 20 MG tablet Take 1 tablet (20 mg total) by mouth daily. 90 tablet 3  . celecoxib (CELEBREX) 200 MG capsule as needed.    . cyclobenzaprine (FLEXERIL) 5 MG tablet Take 5 mg by mouth 3 (three) times daily as needed.    . docusate  sodium (COLACE) 100 MG capsule Take 1 capsule (100 mg total) by mouth 2 (two) times daily as needed for mild constipation. 30 capsule 1  . fexofenadine (ALLEGRA) 180 MG tablet Take 1 tablet (180 mg total) by mouth daily. 30 tablet 11  . gabapentin (NEURONTIN) 300 MG capsule 1-2 tab by mouth at bedtime 180 capsule 1  . hydrochlorothiazide (MICROZIDE) 12.5 MG capsule Take 1 capsule (12.5 mg total) by mouth daily. 90 capsule 3  . hydrocortisone 1 % lotion Apply 1 application topically 2 (two) times daily. Apply to affected area 96 g 0  . metFORMIN (GLUCOPHAGE-XR) 500 MG 24 hr tablet TAKE 4 TABLETS BY MOUTH EVERY MORNING 360 tablet 3  . methocarbamol (ROBAXIN) 500 MG tablet     . nitroGLYCERIN (NITROSTAT) 0.4 MG SL tablet Place 1 tablet (0.4 mg total) under the tongue every 5 (five) minutes as needed for chest pain. 25 tablet 3  . oxyCODONE (OXY IR/ROXICODONE) 5 MG immediate release tablet 4 (four) times daily as needed.    . pantoprazole (PROTONIX) 40 MG tablet Take  40 mg by mouth daily.    . pregabalin (LYRICA) 75 MG capsule Take 75 mg by mouth as needed.    [name] SYNTHROID 150 MCG tablet TAKE 1 TABLET(150 MCG) BY MOUTH DAILY 90 tablet 1  . tiZANidine (ZANAFLEX) 2 MG tablet Take 1 tablet (2 mg total) by mouth every 6 (six) hours as needed for muscle spasms. 40 tablet 1  . triamcinolone (NASACORT) 55 MCG/ACT AERO nasal inhaler Place 2 sprays into the nose daily. 1 Inhaler 12  . Vitamin D, Cholecalciferol, 25 MCG (1000 UT) CAPS Take 1 capsule by mouth daily. 90 capsule 3   No current facility-administered medications on file prior to visit.   Review of Systems All otherwise neg per pt     Objective:   Physical Exam BP 118/72   Pulse (!) 56   Temp 97.8 F (36.6 C)   Ht 5\' 5"  (1.651 m)   Wt 223 lb 3.2 oz (101.2 kg)   BMI 37.14 kg/m  VS noted,  Constitutional: Pt appears in NAD HENT: Head: NCAT.  Right Ear: External ear normal.  Left Ear: External ear normal.  Eyes: . Pupils are equal,  round, and reactive to light. Conjunctivae and EOM are normal Nose: without d/c or deformity Neck: Neck supple. Gross normal ROM Cardiovascular: Normal rate and regular rhythm.   Pulmonary/Chest: Effort normal and breath sounds without rales or wheezing.  Abd:  Soft, NT, ND, + BS, no organomegaly Neurological: Pt is alert. At baseline orientation, motor grossly intact Skin: Skin is warm. No rashes, other new lesions, no LE edema Psychiatric: Pt behavior is normal without agitation  All otherwise neg per pt  Lab Results  Component Value Date   WBC 7.0 09/12/2018   HGB 13.7 09/12/2018   HCT 43.2 09/12/2018   PLT 325.0 09/12/2018   GLUCOSE 138 (H) 11/08/2018   CHOL 116 11/08/2018   TRIG 74 11/08/2018   HDL 38 (L) 11/08/2018   LDLDIRECT 131.0 03/27/2015   LDLCALC 63 11/08/2018   ALT 17 11/08/2018   AST 21 11/08/2018   NA 140 11/08/2018   K 4.5 11/08/2018   CL 102 11/08/2018   CREATININE 0.82 11/08/2018   BUN 9 11/08/2018   CO2 25 11/08/2018   TSH 1.11 09/12/2018   INR 1.08 01/22/2016   HGBA1C 7.0 (H) 09/12/2018   MICROALBUR 1.6 09/12/2018        Assessment & Plan:

## 2019-03-18 NOTE — Assessment & Plan Note (Signed)
For toradol im 30,  to f/u any worsening symptoms or concerns

## 2019-03-18 NOTE — Assessment & Plan Note (Signed)

## 2019-03-18 NOTE — Patient Instructions (Addendum)
Ok to change the B12 shots to the B12 pill - 1 per day (after the shot today)  Please continue all other medications as before, and refills have been done if requested.  Please have the pharmacy call with any other refills you may need.  Please continue your efforts at being more active, low cholesterol diet, and weight control.  Please keep your appointments with your specialists as you may have planned  OK to try the OTC Melatonin from 2 mg up to 10 mg as needed for sleep  Please go to the LAB at the blood drawing area for the tests to be done  You will be contacted by phone if any changes need to be made immediately.  Otherwise, you will receive a letter about your results with an explanation, but please check with MyChart first.  Please remember to sign up for MyChart if you have not done so, as this will be important to you in the future with finding out test results, communicating by private email, and scheduling acute appointments online when needed.  Please make an Appointment to return in 6 months, or sooner if needed, also with Lab Appointment for testing done 3-5 days before at the [facility] (so this is for TWO appointments - please see the scheduling desk as you leave)

## 2019-03-18 NOTE — Assessment & Plan Note (Signed)
For melatonin 2-10 mg qhs prn

## 2019-03-19 LAB — MICROALBUMIN / CREATININE URINE RATIO
Creatinine,U: 118.4 mg/dL
Microalb Creat Ratio: 1.7 mg/g (ref 0.0–30.0)
Microalb, Ur: 2 mg/dL — ABNORMAL HIGH (ref 0.0–1.9)

## 2019-03-20 ENCOUNTER — Encounter: Payer: Self-pay | Admitting: Internal Medicine

## 2019-04-01 ENCOUNTER — Ambulatory Visit: Payer: Medicare Other | Admitting: Cardiovascular Disease

## 2019-04-03 ENCOUNTER — Encounter (HOSPITAL_COMMUNITY)
Admission: EM | Disposition: A | Discharge status: Home / Self Care | Payer: Self-pay | Source: Home / Self Care | Attending: Interventional Cardiology

## 2019-04-03 ENCOUNTER — Other Ambulatory Visit: Payer: Self-pay

## 2019-04-03 ENCOUNTER — Emergency Department (HOSPITAL_COMMUNITY): Payer: Medicare HMO

## 2019-04-03 ENCOUNTER — Encounter (HOSPITAL_COMMUNITY): Payer: Self-pay | Admitting: Emergency Medicine

## 2019-04-03 ENCOUNTER — Inpatient Hospital Stay (HOSPITAL_COMMUNITY)
Admission: EM | Admit: 2019-04-03 | Discharge: 2019-04-04 | DRG: 287 | Disposition: A | Discharge status: Home / Self Care | Payer: Medicare HMO | Attending: Interventional Cardiology | Admitting: Interventional Cardiology

## 2019-04-03 DIAGNOSIS — Z955 Presence of coronary angioplasty implant and graft: Secondary | ICD-10-CM

## 2019-04-03 DIAGNOSIS — Z888 Allergy status to other drugs, medicaments and biological substances status: Secondary | ICD-10-CM

## 2019-04-03 DIAGNOSIS — Z7984 Long term (current) use of oral hypoglycemic drugs: Secondary | ICD-10-CM

## 2019-04-03 DIAGNOSIS — M79609 Pain in unspecified limb: Secondary | ICD-10-CM

## 2019-04-03 DIAGNOSIS — E890 Postprocedural hypothyroidism: Secondary | ICD-10-CM | POA: Diagnosis present

## 2019-04-03 DIAGNOSIS — I959 Hypotension, unspecified: Secondary | ICD-10-CM | POA: Diagnosis not present

## 2019-04-03 DIAGNOSIS — I714 Abdominal aortic aneurysm, without rupture: Secondary | ICD-10-CM | POA: Diagnosis present

## 2019-04-03 DIAGNOSIS — E782 Mixed hyperlipidemia: Secondary | ICD-10-CM | POA: Diagnosis not present

## 2019-04-03 DIAGNOSIS — E669 Obesity, unspecified: Secondary | ICD-10-CM | POA: Diagnosis present

## 2019-04-03 DIAGNOSIS — I10 Essential (primary) hypertension: Secondary | ICD-10-CM | POA: Diagnosis not present

## 2019-04-03 DIAGNOSIS — Z803 Family history of malignant neoplasm of breast: Secondary | ICD-10-CM

## 2019-04-03 DIAGNOSIS — E785 Hyperlipidemia, unspecified: Secondary | ICD-10-CM | POA: Diagnosis present

## 2019-04-03 DIAGNOSIS — Z881 Allergy status to other antibiotic agents status: Secondary | ICD-10-CM | POA: Diagnosis not present

## 2019-04-03 DIAGNOSIS — I252 Old myocardial infarction: Secondary | ICD-10-CM | POA: Diagnosis not present

## 2019-04-03 DIAGNOSIS — I4510 Unspecified right bundle-branch block: Secondary | ICD-10-CM | POA: Diagnosis present

## 2019-04-03 DIAGNOSIS — M79662 Pain in left lower leg: Secondary | ICD-10-CM

## 2019-04-03 DIAGNOSIS — I200 Unstable angina: Secondary | ICD-10-CM | POA: Diagnosis not present

## 2019-04-03 DIAGNOSIS — I2510 Atherosclerotic heart disease of native coronary artery without angina pectoris: Secondary | ICD-10-CM | POA: Diagnosis present

## 2019-04-03 DIAGNOSIS — M5136 Other intervertebral disc degeneration, lumbar region: Secondary | ICD-10-CM | POA: Diagnosis present

## 2019-04-03 DIAGNOSIS — I25110 Atherosclerotic heart disease of native coronary artery with unstable angina pectoris: Principal | ICD-10-CM | POA: Diagnosis present

## 2019-04-03 DIAGNOSIS — M79605 Pain in left leg: Secondary | ICD-10-CM | POA: Diagnosis not present

## 2019-04-03 DIAGNOSIS — Z8249 Family history of ischemic heart disease and other diseases of the circulatory system: Secondary | ICD-10-CM | POA: Diagnosis not present

## 2019-04-03 DIAGNOSIS — M109 Gout, unspecified: Secondary | ICD-10-CM | POA: Diagnosis not present

## 2019-04-03 DIAGNOSIS — M7989 Other specified soft tissue disorders: Secondary | ICD-10-CM

## 2019-04-03 DIAGNOSIS — Z6837 Body mass index (BMI) 37.0-37.9, adult: Secondary | ICD-10-CM

## 2019-04-03 DIAGNOSIS — R079 Chest pain, unspecified: Secondary | ICD-10-CM | POA: Diagnosis not present

## 2019-04-03 DIAGNOSIS — F411 Generalized anxiety disorder: Secondary | ICD-10-CM | POA: Diagnosis present

## 2019-04-03 DIAGNOSIS — Z7982 Long term (current) use of aspirin: Secondary | ICD-10-CM

## 2019-04-03 DIAGNOSIS — F17200 Nicotine dependence, unspecified, uncomplicated: Secondary | ICD-10-CM | POA: Diagnosis present

## 2019-04-03 DIAGNOSIS — Z79890 Hormone replacement therapy (postmenopausal): Secondary | ICD-10-CM

## 2019-04-03 DIAGNOSIS — M79652 Pain in left thigh: Secondary | ICD-10-CM | POA: Diagnosis not present

## 2019-04-03 DIAGNOSIS — Z20822 Contact with and (suspected) exposure to covid-19: Secondary | ICD-10-CM | POA: Diagnosis not present

## 2019-04-03 DIAGNOSIS — R600 Localized edema: Secondary | ICD-10-CM

## 2019-04-03 DIAGNOSIS — E1152 Type 2 diabetes mellitus with diabetic peripheral angiopathy with gangrene: Secondary | ICD-10-CM | POA: Diagnosis not present

## 2019-04-03 DIAGNOSIS — Z833 Family history of diabetes mellitus: Secondary | ICD-10-CM | POA: Diagnosis not present

## 2019-04-03 DIAGNOSIS — R0789 Other chest pain: Secondary | ICD-10-CM | POA: Diagnosis not present

## 2019-04-03 DIAGNOSIS — Z87891 Personal history of nicotine dependence: Secondary | ICD-10-CM | POA: Diagnosis not present

## 2019-04-03 DIAGNOSIS — E119 Type 2 diabetes mellitus without complications: Secondary | ICD-10-CM

## 2019-04-03 HISTORY — PX: LEFT HEART CATH AND CORONARY ANGIOGRAPHY: CATH118249

## 2019-04-03 LAB — RESPIRATORY PANEL BY RT PCR (FLU A&B, COVID)
Influenza A by PCR: NEGATIVE
Influenza B by PCR: NEGATIVE
SARS Coronavirus 2 by RT PCR: NEGATIVE

## 2019-04-03 LAB — CBG MONITORING, ED: Glucose-Capillary: 114 mg/dL — ABNORMAL HIGH (ref 70–99)

## 2019-04-03 LAB — BASIC METABOLIC PANEL
Anion gap: 13 (ref 5–15)
BUN: 12 mg/dL (ref 8–23)
CO2: 25 mmol/L (ref 22–32)
Calcium: 8.6 mg/dL — ABNORMAL LOW (ref 8.9–10.3)
Chloride: 101 mmol/L (ref 98–111)
Creatinine, Ser: 0.84 mg/dL (ref 0.44–1.00)
GFR calc Af Amer: >60 mL/min (ref >60)
GFR calc non Af Amer: >60 mL/min (ref >60)
Glucose, Bld: 116 mg/dL — ABNORMAL HIGH (ref 70–99)
Potassium: 3.5 mmol/L (ref 3.5–5.1)
Sodium: 139 mmol/L (ref 135–145)

## 2019-04-03 LAB — TROPONIN I (HIGH SENSITIVITY)
Troponin I (High Sensitivity): 7 ng/L (ref <18)
Troponin I (High Sensitivity): 13 ng/L (ref <18)

## 2019-04-03 LAB — CBC
HCT: 46.4 % — ABNORMAL HIGH (ref 36.0–46.0)
Hemoglobin: 14.2 g/dL (ref 12.0–15.0)
MCH: 28.1 pg (ref 26.0–34.0)
MCHC: 30.6 g/dL (ref 30.0–36.0)
MCV: 91.7 fL (ref 80.0–100.0)
Platelets: 274 10*3/uL (ref 150–400)
RBC: 5.06 MIL/uL (ref 3.87–5.11)
RDW: 15.6 % — ABNORMAL HIGH (ref 11.5–15.5)
WBC: 9.2 10*3/uL (ref 4.0–10.5)
nRBC: 0 % (ref 0.0–0.2)

## 2019-04-03 LAB — GLUCOSE, CAPILLARY
Glucose-Capillary: 143 mg/dL — ABNORMAL HIGH (ref 70–99)
Glucose-Capillary: 134 mg/dL — ABNORMAL HIGH (ref 70–99)

## 2019-04-03 SURGERY — LEFT HEART CATH AND CORONARY ANGIOGRAPHY
Anesthesia: LOCAL

## 2019-04-03 MED ORDER — LIDOCAINE HCL (PF) 1 % IJ SOLN
INTRAMUSCULAR | Status: DC | PRN
  Administered 2019-04-03: 2 mL

## 2019-04-03 MED ORDER — SODIUM CHLORIDE 0.9% FLUSH
3.0000 mL | Freq: Once | INTRAVENOUS | Status: AC
  Administered 2019-04-03: 09:00:00 3 mL via INTRAVENOUS

## 2019-04-03 MED ORDER — HEPARIN (PORCINE) 25000 UT/250ML-% IV SOLN
950.0000 [IU]/h | INTRAVENOUS | Status: DC
  Administered 2019-04-03: 950 [IU]/h via INTRAVENOUS
  Filled 2019-04-03: qty 250

## 2019-04-03 MED ORDER — PANTOPRAZOLE SODIUM 40 MG PO TBEC: 40.0000 mg | DELAYED_RELEASE_TABLET | Freq: Every day | ORAL | Status: DC

## 2019-04-03 MED ORDER — FENTANYL CITRATE (PF) 100 MCG/2ML IJ SOLN
INTRAMUSCULAR | Status: AC
  Filled 2019-04-03: qty 2

## 2019-04-03 MED ORDER — ASPIRIN 81 MG PO TBEC: 81.0000 mg | DELAYED_RELEASE_TABLET | Freq: Every day | ORAL | Status: DC

## 2019-04-03 MED ORDER — SODIUM CHLORIDE 0.9 % IV SOLN: 250.0000 mL | INTRAVENOUS | Status: DC | PRN

## 2019-04-03 MED ORDER — ACETAMINOPHEN 325 MG PO TABS: 650.0000 mg | ORAL_TABLET | ORAL | Status: DC | PRN

## 2019-04-03 MED ORDER — IOHEXOL 350 MG/ML SOLN
INTRAVENOUS | Status: DC | PRN
  Administered 2019-04-03: 65 mL

## 2019-04-03 MED ORDER — HEPARIN SODIUM (PORCINE) 1000 UNIT/ML IJ SOLN
INTRAMUSCULAR | Status: DC | PRN
  Administered 2019-04-03: 5000 [IU] via INTRAVENOUS

## 2019-04-03 MED ORDER — SODIUM CHLORIDE 0.9 % WEIGHT BASED INFUSION: 3.0000 mL/kg/h | INTRAVENOUS | Status: DC

## 2019-04-03 MED ORDER — ACETAMINOPHEN 325 MG PO TABS
650.0000 mg | ORAL_TABLET | ORAL | Status: DC | PRN
  Administered 2019-04-03 – 2019-04-04 (×2): 650 mg via ORAL
  Filled 2019-04-03 (×2): qty 2

## 2019-04-03 MED ORDER — HYDROCHLOROTHIAZIDE 12.5 MG PO CAPS: 12.5000 mg | ORAL_CAPSULE | Freq: Every day | ORAL | Status: DC

## 2019-04-03 MED ORDER — ATORVASTATIN CALCIUM 80 MG PO TABS
80.0000 mg | ORAL_TABLET | Freq: Every day | ORAL | Status: DC
  Administered 2019-04-03 – 2019-04-04 (×2): 80 mg via ORAL
  Filled 2019-04-03 (×2): qty 1

## 2019-04-03 MED ORDER — LORATADINE 10 MG PO TABS: 10.0000 mg | ORAL_TABLET | Freq: Every day | ORAL | Status: DC | PRN

## 2019-04-03 MED ORDER — NITROGLYCERIN 0.4 MG SL SUBL: 0.4000 mg | SUBLINGUAL_TABLET | SUBLINGUAL | Status: DC | PRN

## 2019-04-03 MED ORDER — DOCUSATE SODIUM 100 MG PO CAPS: 100.0000 mg | ORAL_CAPSULE | Freq: Two times a day (BID) | ORAL | Status: DC | PRN

## 2019-04-03 MED ORDER — SODIUM CHLORIDE 0.9% FLUSH: 3.0000 mL | Freq: Two times a day (BID) | INTRAVENOUS | Status: DC

## 2019-04-03 MED ORDER — VERAPAMIL HCL 2.5 MG/ML IV SOLN
INTRAVENOUS | Status: DC | PRN
  Administered 2019-04-03: 10 mL via INTRA_ARTERIAL

## 2019-04-03 MED ORDER — HEPARIN (PORCINE) IN NACL 1000-0.9 UT/500ML-% IV SOLN
INTRAVENOUS | Status: DC | PRN
  Administered 2019-04-03 (×2): 500 mL

## 2019-04-03 MED ORDER — ALLOPURINOL 300 MG PO TABS
300.0000 mg | ORAL_TABLET | Freq: Every day | ORAL | Status: DC
  Administered 2019-04-04: 300 mg via ORAL
  Filled 2019-04-03: qty 1

## 2019-04-03 MED ORDER — HEPARIN SODIUM (PORCINE) 1000 UNIT/ML IJ SOLN
INTRAMUSCULAR | Status: AC
  Filled 2019-04-03: qty 1

## 2019-04-03 MED ORDER — ONDANSETRON HCL 4 MG/2ML IJ SOLN: 4.0000 mg | Freq: Four times a day (QID) | INTRAMUSCULAR | Status: DC | PRN

## 2019-04-03 MED ORDER — ASPIRIN EC 81 MG PO TBEC
81.0000 mg | DELAYED_RELEASE_TABLET | Freq: Every day | ORAL | Status: DC
  Administered 2019-04-04: 81 mg via ORAL
  Filled 2019-04-03: qty 1

## 2019-04-03 MED ORDER — NITROGLYCERIN IN D5W 200-5 MCG/ML-% IV SOLN
0.0000 ug/min | INTRAVENOUS | Status: DC
  Administered 2019-04-03: 5 ug/min via INTRAVENOUS
  Filled 2019-04-03: qty 250

## 2019-04-03 MED ORDER — SODIUM CHLORIDE 0.9 % WEIGHT BASED INFUSION: 1.0000 mL/kg/h | INTRAVENOUS | Status: DC

## 2019-04-03 MED ORDER — ENOXAPARIN SODIUM 40 MG/0.4ML ~~LOC~~ SOLN
40.0000 mg | SUBCUTANEOUS | Status: DC
  Administered 2019-04-04: 40 mg via SUBCUTANEOUS
  Filled 2019-04-03: qty 0.4

## 2019-04-03 MED ORDER — ATENOLOL 25 MG PO TABS
25.0000 mg | ORAL_TABLET | Freq: Two times a day (BID) | ORAL | Status: DC
  Administered 2019-04-03 – 2019-04-04 (×2): 25 mg via ORAL
  Filled 2019-04-03 (×2): qty 1

## 2019-04-03 MED ORDER — HEPARIN BOLUS VIA INFUSION
4000.0000 [IU] | Freq: Once | INTRAVENOUS | Status: AC
  Administered 2019-04-03: 12:00:00 4000 [IU] via INTRAVENOUS
  Filled 2019-04-03: qty 4000

## 2019-04-03 MED ORDER — SODIUM CHLORIDE 0.9% FLUSH: 3.0000 mL | INTRAVENOUS | Status: DC | PRN

## 2019-04-03 MED ORDER — ISOSORBIDE MONONITRATE ER 30 MG PO TB24
30.0000 mg | ORAL_TABLET | Freq: Every day | ORAL | Status: DC
  Administered 2019-04-03 – 2019-04-04 (×2): 30 mg via ORAL
  Filled 2019-04-03 (×2): qty 1

## 2019-04-03 MED ORDER — SODIUM CHLORIDE 0.9 % IV SOLN: INTRAVENOUS | Status: AC

## 2019-04-03 MED ORDER — LEVOTHYROXINE SODIUM 75 MCG PO TABS
150.0000 ug | ORAL_TABLET | Freq: Every day | ORAL | Status: DC
  Administered 2019-04-04: 150 ug via ORAL
  Filled 2019-04-03: qty 2

## 2019-04-03 MED ORDER — FENTANYL CITRATE (PF) 100 MCG/2ML IJ SOLN
INTRAMUSCULAR | Status: DC | PRN
  Administered 2019-04-03 (×2): 25 ug via INTRAVENOUS

## 2019-04-03 MED ORDER — LABETALOL HCL 5 MG/ML IV SOLN: 10.0000 mg | INTRAVENOUS | Status: AC | PRN

## 2019-04-03 MED ORDER — INSULIN ASPART 100 UNIT/ML ~~LOC~~ SOLN
0.0000 [IU] | Freq: Three times a day (TID) | SUBCUTANEOUS | Status: DC
  Administered 2019-04-04: 3 [IU] via SUBCUTANEOUS

## 2019-04-03 MED ORDER — VERAPAMIL HCL 2.5 MG/ML IV SOLN
INTRAVENOUS | Status: AC
  Filled 2019-04-03: qty 2

## 2019-04-03 MED ORDER — LIDOCAINE HCL (PF) 1 % IJ SOLN
INTRAMUSCULAR | Status: AC
  Filled 2019-04-03: qty 30

## 2019-04-03 MED ORDER — PREGABALIN 75 MG PO CAPS
75.0000 mg | ORAL_CAPSULE | Freq: Every day | ORAL | Status: DC
  Filled 2019-04-03: qty 3

## 2019-04-03 MED ORDER — HEPARIN (PORCINE) IN NACL 1000-0.9 UT/500ML-% IV SOLN
INTRAVENOUS | Status: AC
  Filled 2019-04-03: qty 1000

## 2019-04-03 MED ORDER — GABAPENTIN 300 MG PO CAPS
300.0000 mg | ORAL_CAPSULE | Freq: Every day | ORAL | Status: DC
  Administered 2019-04-03: 300 mg via ORAL
  Filled 2019-04-03: qty 1

## 2019-04-03 MED ORDER — ASPIRIN 81 MG PO CHEW: 81.0000 mg | CHEWABLE_TABLET | ORAL | Status: DC

## 2019-04-03 MED ORDER — MIDAZOLAM HCL 2 MG/2ML IJ SOLN
INTRAMUSCULAR | Status: AC
  Filled 2019-04-03: qty 2

## 2019-04-03 MED ORDER — NITROGLYCERIN IN D5W 200-5 MCG/ML-% IV SOLN: 0.0000 ug/min | INTRAVENOUS | Status: DC

## 2019-04-03 MED ORDER — HYDRALAZINE HCL 20 MG/ML IJ SOLN: 10.0000 mg | INTRAMUSCULAR | Status: AC | PRN

## 2019-04-03 MED ORDER — CYCLOBENZAPRINE HCL 10 MG PO TABS: 5.0000 mg | ORAL_TABLET | Freq: Three times a day (TID) | ORAL | Status: DC | PRN

## 2019-04-03 MED ORDER — MIDAZOLAM HCL 2 MG/2ML IJ SOLN
INTRAMUSCULAR | Status: DC | PRN
  Administered 2019-04-03 (×2): 1 mg via INTRAVENOUS

## 2019-04-03 SURGICAL SUPPLY — 13 items
CATH INFINITI 5FR MULTPACK ANG (CATHETERS) ×1 IMPLANT
DEVICE RAD COMP TR BAND LRG (VASCULAR PRODUCTS) ×1 IMPLANT
GLIDESHEATH SLEND SS 6F .021 (SHEATH) ×1 IMPLANT
GUIDEWIRE INQWIRE 1.5J.035X260 (WIRE) IMPLANT
INQWIRE 1.5J .035X260CM (WIRE) ×2
KIT HEART LEFT (KITS) ×2 IMPLANT
PACK CARDIAC CATHETERIZATION (CUSTOM PROCEDURE TRAY) ×2 IMPLANT
SHIELD RADPAD SCOOP 12X17 (MISCELLANEOUS) ×1 IMPLANT
SYR MEDRAD MARK 7 150ML (SYRINGE) ×2 IMPLANT
TRANSDUCER W/STOPCOCK (MISCELLANEOUS) ×2 IMPLANT
TUBING ART PRESS 72  MALE/FEM (TUBING) ×2
TUBING ART PRESS 72 MALE/FEM (TUBING) IMPLANT
TUBING CIL FLEX 10 FLL-RA (TUBING) ×2 IMPLANT

## 2019-04-03 NOTE — ED Triage Notes (Signed)
Pt to triage via [facility].  Woke up with 10/10 chest tightness that radiated to bilateral shoulders.  Took 3 NTG that decreased pain to 6/10.  EMS administered 1 NTG.  BP 90/40 repeat 130 palpated.  Currently pain 5/10.  Denies SOB, nausea, and vomiting.

## 2019-04-03 NOTE — ED Notes (Signed)
Nitro started.  Pt does report that she took x 3 at home SL Nitro with no relief and called EMS.  Additional given by EMS and pain went from 10 to 6-7.

## 2019-04-03 NOTE — Progress Notes (Signed)
[facility] for heparin Indication: chest pain/ACS  Heparin Dosing Weight: 80.5 kg  Labs: Recent Labs    04/03/19 0738  HGB 14.2  HCT 46.4*  PLT 274  CREATININE 0.84  TROPONINIHS 7    Estimated Creatinine Clearance: 70.6 mL/min (by C-G formula based on SCr of 0.84 mg/dL).  Assessment: 75 yof presenting with CP. Pharmacy consulted to dose heparin. Patient is not on anticoagulation PTA. Initial troponin negative. CBC wnl. Noted remote hx of GIB but no active bleed issues documented.  Goal of Therapy:  Heparin level 0.3-0.7 units/ml Monitor platelets by anticoagulation protocol: Yes   Plan:  Heparin 4000 unit bolus Start heparin at 950 units/hr 6hr heparin level Monitor daily heparin level and CBC, s/sx bleeding F/u Cardiology plans   Arturo Morton, PharmD, BCPS Please check AMION for all [facility] contact numbers Clinical Pharmacist 04/03/2019 9:21 AM

## 2019-04-03 NOTE — ED Notes (Signed)
Pain rated a "5" to midsternal chest/ R breast.  Denies any SOB or nausea.  Alert and oriented without outward distress.  Pleasant and talkative.

## 2019-04-03 NOTE — Progress Notes (Signed)
VASCULAR LAB PRELIMINARY  PRELIMINARY  PRELIMINARY  PRELIMINARY  Left lower extremity venous duplex completed.    Preliminary report:  See CV proc for preliminary results.   Deitra Craine, RVT 04/03/2019, 12:01 PM

## 2019-04-03 NOTE — ED Notes (Signed)
Sudden increase in CP and Nitro titrated up,  Pain relieved to a 7 quickly.  Pure wick placed and pt able to assist with movement.

## 2019-04-03 NOTE — H&P (Addendum)
Cardiology Admission History and Physical:   Patient ID: [name] MRN: [id number]; DOB: 1945-05-07   Admission date: 04/03/2019  Primary Care Provider: Biagio Borg, MD Primary Cardiologist: Sherren Mocha, MD  Primary Electrophysiologist:  None   Chief Complaint:  Chest pain  Patient Profile:   [name] is a 74 y.o. female with history of CAD with multiple interventions, DM, HTN, HLD, and AAA repair s/p EVAR (2018).    History of Present Illness:   [name] has CAD with initial presentation as an inferior MI in 1998 treated with RCA stent. She also has stents in her LCx (2012), LAD (2015), multiple stents to RCA (2017). Stress tests 11/2016 and 07/2018 negative for reversible ischemia. She was last seen in clinic 02/11/19 by Dr. Burt Knack. At that time, she described chest pain requiring nitro x 3 three weeks prior. Her anginal equivalent is pain under her right breast. She has been maintained on ASA, atenolol, and lipitor.   She presented to [facility] this morning after an episode of 10/10 chest pain work her from sleep at 0430. CP is described as a pressure that started under her right breast and radiated across both shoulders and down her right arm. No SOB or diaphoresis. She took nitro x 3 with some improvement in CP. She then drank some coke to help her belch, which relieved her CP. She was able to go back to sleep, but woke up again at 0630 with recurrent chest pain prompting her to call EMS. She is now on a nitro drip without CP.   She also relays that she is very nervous because her brother died in his sleep last 2022/07/05 from a suspected MI.   She reports swelling and pain in her left lower extremity that started 2-3 days ago, not relieved with her voltaren cream. Pain in her calf and thigh.   HS troponin negative. She has a history of negative enzymes at that time of her interventions.   Past Medical History:  Diagnosis Date  . AAA (abdominal aortic aneurysm) ([place])    a. s/p  stent graft repair 01/2016.  [name] Allergic rhinitis, cause unspecified   . Anxiety state, unspecified   . Chronic LBP   . Coronary artery disease    a. inferior MI 1998 s/p PCI of RCA. b. stenting of Cx 04/2010. c. DES to prox LAD July 04, 2013. d. DES x 3 in 10/2015. // Myoview 07/2018:  EF 59, normal perfusion, low risk   . Degeneration of lumbar or lumbosacral intervertebral disc   . Depressive disorder, not elsewhere classified   . Diabetes mellitus    TYPE II  . Gout 08/22/2013  . Hyperlipidemia   . Hypertension   . Hypothyroidism   . Lower GI bleed 06/2010   Diverticular bleed  . Noncompliance with medications 02/26/2014  . Obesity, unspecified     Past Surgical History:  Procedure Laterality Date  . CARDIAC CATHETERIZATION     PCI OF BOTH THE CIRCUMFLEX AND LEFT ANTERIOR DESCENDING ARTERY  . CARDIAC CATHETERIZATION N/A 10/29/2015   Procedure: Coronary Stent Intervention;  Surgeon: Nelva Bush, MD;  Location: [place] CV LAB;  Service: Cardiovascular;  Laterality: N/A;  . CARDIAC CATHETERIZATION N/A 10/29/2015   Procedure: Coronary/Graft Angiography;  Surgeon: Nelva Bush, MD;  Location: [place] CV LAB;  Service: Cardiovascular;  Laterality: N/A;  . CARDIAC CATHETERIZATION N/A 10/29/2015   Procedure: Intravascular Pressure Wire/FFR Study;  Surgeon: Nelva Bush, MD;  Location: [place] CV LAB;  Service: Cardiovascular;  Laterality: N/A;  . CESAREAN SECTION    . ENDOVASCULAR STENT INSERTION N/A 01/22/2016   Procedure: ABDOMINAL AORTIC ENDOVASCULAR STENT GRAFT INSERTION;  Surgeon: Rosetta Posner, MD;  Location: [place];  Service: Vascular;  Laterality: N/A;  . HEART STENT  04-2010  and  Jun 07, 2013   X 3  . LEFT HEART CATHETERIZATION WITH CORONARY ANGIOGRAM N/A 06/07/2013   Procedure: LEFT HEART CATHETERIZATION WITH CORONARY ANGIOGRAM;  Surgeon: [name], MD;  Location: [facility] CATH LAB;  Service: Cardiovascular;  Laterality: N/A;  . LEFT HEART CATHETERIZATION WITH  CORONARY ANGIOGRAM N/A 02/25/2014   Procedure: LEFT HEART CATHETERIZATION WITH CORONARY ANGIOGRAM;  Surgeon: Troy Sine, MD;  Location: [facility] CATH LAB;  Service: Cardiovascular;  Laterality: N/A;  . LUMBAR FUSION  01/2007   DR. Patrice Paradise...3-LEVEL WITH FIXATION  . OOPHORECTOMY     BSO? pt.unsure  . PARATHYROIDECTOMY    . SPINE SURGERY    . THYROIDECTOMY    . TOTAL ABDOMINAL HYSTERECTOMY       Medications Prior to Admission: Prior to Admission medications   Medication Sig Start Date End Date Taking? Authorizing Provider  allopurinol (ZYLOPRIM) 300 MG tablet Take 1 tablet (300 mg total) by mouth daily. 04/14/16   Biagio Borg, MD  aspirin 81 MG EC tablet Take 81 mg by mouth daily.     [provider]  atenolol (TENORMIN) 25 MG tablet TAKE 1 TABLET(25 MG) BY MOUTH TWICE DAILY 06/15/18   Biagio Borg, MD  atorvastatin (LIPITOR) 20 MG tablet Take 1 tablet (20 mg total) by mouth daily. 09/12/18 09/12/19  Biagio Borg, MD  celecoxib (CELEBREX) 200 MG capsule as needed. 09/29/17   [provider]  cyanocobalamin 1000 MCG tablet Take 1 tablet (1,000 mcg total) by mouth daily. 03/18/19   Biagio Borg, MD  cyclobenzaprine (FLEXERIL) 5 MG tablet Take 5 mg by mouth 3 (three) times daily as needed. 11/20/18   [provider]  docusate sodium (COLACE) 100 MG capsule Take 1 capsule (100 mg total) by mouth 2 (two) times daily as needed for mild constipation. 03/04/19   Joseph Pierini, MD  fexofenadine (ALLEGRA) 180 MG tablet Take 1 tablet (180 mg total) by mouth daily. 08/03/18 08/03/19  Biagio Borg, MD  gabapentin (NEURONTIN) 300 MG capsule 1-2 tab by mouth at bedtime 11/19/18   Biagio Borg, MD  hydrochlorothiazide (MICROZIDE) 12.5 MG capsule Take 1 capsule (12.5 mg total) by mouth daily. 10/25/17   Biagio Borg, MD  hydrocortisone 1 % lotion Apply 1 application topically 2 (two) times daily. Apply to affected area 03/04/19   Joseph Pierini, MD  metFORMIN (GLUCOPHAGE-XR) 500 MG 24 hr  tablet TAKE 4 TABLETS BY MOUTH EVERY MORNING 01/11/17   Biagio Borg, MD  methocarbamol (ROBAXIN) 500 MG tablet  07/26/18   [provider]  nitroGLYCERIN (NITROSTAT) 0.4 MG SL tablet Place 1 tablet (0.4 mg total) under the tongue every 5 (five) minutes as needed for chest pain. 12/31/18   Richardson Dopp T, PA-C  oxyCODONE (OXY IR/ROXICODONE) 5 MG immediate release tablet 4 (four) times daily as needed. 10/01/17   [provider]  pantoprazole (PROTONIX) 40 MG tablet Take 40 mg by mouth daily.    [provider]  pregabalin (LYRICA) 75 MG capsule Take 75 mg by mouth as needed. 11/20/18   [provider]  SYNTHROID 150 MCG tablet TAKE 1 TABLET(150 MCG) BY MOUTH DAILY 02/01/19   [name]  W, MD  tiZANidine (ZANAFLEX) 2 MG tablet Take 1 tablet (2 mg total) by mouth every 6 (six) hours as needed for muscle spasms. 03/13/18   Biagio Borg, MD  triamcinolone (NASACORT) 55 MCG/ACT AERO nasal inhaler Place 2 sprays into the nose daily. 08/03/18   Biagio Borg, MD  Vitamin D, Cholecalciferol, 25 MCG (1000 UT) CAPS Take 1 capsule by mouth daily. 03/04/19   Joseph Pierini, MD     Allergies:    Allergies  Allergen Reactions  . Prilosec [Omeprazole] Other (See Comments)    Chest pain  . Miconazole Nitrate Hives    REACTION: hives  . Crestor [Rosuvastatin Calcium] Other (See Comments)    Feeling poor  . Augmentin [Amoxicillin-Pot Clavulanate] Hives, Itching and Rash    Has patient had a PCN reaction causing immediate rash, facial/tongue/throat swelling, SOB or lightheadedness with hypotension:unsure Has patient had a PCN reaction causing severe rash involving mucus membranes or skin necrosis:unsure Has patient had a PCN reaction that required hospitalization:No Has patient had a PCN reaction occurring within the last 10 years:NO If all of the above answers are "NO", then may proceed with Cephalosporin use. Has patient had a PCN reaction causing immediate rash,  facial/tongue/throat swelling, SOB or lightheadedness with hypotension:unsure Has patient had a PCN reaction causing severe rash involving mucus membranes or skin necrosis:unsure Has patient had a PCN reaction that required hospitalization:No Has patient had a PCN reaction occurring within the last 10 years:NO If all of the above answers are "NO", then may proceed with Cephalosporin use.   [name] Doxycycline Other (See Comments)    REACTION: gi upset    Social History:   Social History   Socioeconomic History  . Marital status: Divorced    Spouse name: Not on file  . Number of children: 3  . Years of education: 59  . Highest education level: Not on file  Occupational History  . Occupation: RETIRED    Employer: [employer]    Comment: ADMIN SUPPORT    Employer: RETIRED  Tobacco Use  . Smoking status: Former Smoker    Years: 30.00    Types: Cigarettes    Quit date: 05/31/1986    Years since quitting: 32.8  . Smokeless tobacco: Never Used  Substance and Sexual Activity  . Alcohol use: No  . Drug use: No  . Sexual activity: Not Currently    Comment: 1st intercourse 74 yo-Fewer than 5 partners  Other Topics Concern  . Not on file  Social History Narrative   DIVORCED   3 CHILDREN   PATIENT SIGNED A DESIGNATED PARTY RELEASE TO ALLOW HER DAUGHTER, TRAMAINE Fellers, TO HAVE ACCESS TO HER MEDICAL RECORDS/INFORMATION. [name], May 04, 2009 @ 3:27 PM   Right handed   One story home   .   Social Determinants of Health   Financial Resource Strain:   . Difficulty of Paying Living Expenses:   Food Insecurity:   . Worried About Charity fundraiser in the Last Year:   . Arboriculturist in the Last Year:   Transportation Needs:   . Film/video editor (Medical):   [name] Lack of Transportation (Non-Medical):   Physical Activity:   . Days of Exercise per Week:   . Minutes of Exercise per Session:   Stress:   . Feeling of Stress :   Social Connections:   . Frequency of  Communication with Friends and Family:   . Frequency of Social  Gatherings with Friends and Family:   . Attends Religious Services:   . Active Member of Clubs or Organizations:   . Attends Archivist Meetings:   [name] Marital Status:   Intimate Partner Violence:   . Fear of Current or Ex-Partner:   . Emotionally Abused:   [name] Physically Abused:   . Sexually Abused:     Family History:   The patient's family history includes Arthritis in her maternal aunt; Breast cancer in her maternal aunt; Diabetes in her brother and maternal aunt; Heart attack (age of onset: 66) in her mother; Heart attack (age of onset: 79) in her father; Heart disease in her father and mother. There is no history of Colon cancer.    ROS:  Please see the history of present illness.  All other ROS reviewed and negative.     Physical Exam/Data:   Vitals:   04/03/19 0915 04/03/19 0930 04/03/19 1000 04/03/19 1015  BP: 121/70 106/61 106/63 (!) 112/55  Pulse:      Resp: 18 12 (!) 21 20  Temp:      TempSrc:      SpO2:      Weight:      Height:       No intake or output data in the 24 hours ending 04/03/19 1027 Last 3 Weights 04/03/2019 03/18/2019 03/04/2019  Weight (lbs) 225 lb 223 lb 3.2 oz 225 lb  Weight (kg) 102.059 kg 101.243 kg 102.059 kg     Body mass index is 37.44 kg/m.  General:  Well nourished, well developed, in no acute distress HEENT: normal Lymph: no adenopathy Neck: no JVD Endocrine:  No thryomegaly Vascular: No carotid bruits; FA pulses 2+ bilaterally without bruits  Cardiac:  normal S1, S2; RRR; no murmur  Lungs:  clear to auscultation bilaterally, no wheezing, rhonchi or rales  Abd: soft, nontender, no hepatomegaly  Ext: minimal left lower extremity edema Musculoskeletal:  No deformities, BUE and BLE strength normal and equal Skin: warm and dry  Neuro:  CNs 2-12 intact, no focal abnormalities noted Psych:  Normal affect    EKG:  The ECG that was done was personally reviewed and  demonstrates sinus rhythm with HR 69 and RBBB (old)  Relevant CV Studies:    Laboratory Data:  High Sensitivity Troponin:   Recent Labs  Lab 04/03/19 0738  TROPONINIHS 7      Chemistry Recent Labs  Lab 04/03/19 0738  NA 139  K 3.5  CL 101  CO2 25  GLUCOSE 116*  BUN 12  CREATININE 0.84  CALCIUM 8.6*  GFRNONAA >60  GFRAA >60  ANIONGAP 13    No results for input(s): PROT, ALBUMIN, AST, ALT, ALKPHOS, BILITOT in the last 168 hours. Hematology Recent Labs  Lab 04/03/19 0738  WBC 9.2  RBC 5.06  HGB 14.2  HCT 46.4*  MCV 91.7  MCH 28.1  MCHC 30.6  RDW 15.6*  PLT 274   BNPNo results for input(s): BNP, PROBNP in the last 168 hours.  DDimer No results for input(s): DDIMER in the last 168 hours.   Radiology/Studies:  DG Chest 2 View  Result Date: 04/03/2019 CLINICAL DATA:  Chest pain EXAM: CHEST - 2 VIEW COMPARISON:  06/11/2017 FINDINGS: The heart size and mediastinal contours are within normal limits. Both lungs are clear. Disc degenerative disease of the thoracic spine. Extensive, partially imaged posterior thoracolumbar fusion. IMPRESSION: No acute abnormality of the lungs. Electronically Signed   By: Eddie Candle M.D.   On:  04/03/2019 08:18       TIMI Risk Score for Unstable Angina or Non-ST Elevation MI:   The patient's TIMI risk score is 5, which indicates a 26% risk of all cause mortality, new or recurrent myocardial infarction or need for urgent revascularization in the next 14 days.   Assessment and Plan:   1. Chest pain concerning for unstable angina 2. CAD with known multivessel disease s/p multiple interventions, last stents placed in 2017 - hs troponin 7 - EKG with RBBB (old) - patient describes symptoms concerning for unstable angina - negative CE, but appears to have a history of negative enzymes prior to last heart cath - will admit to cardiology and plan for left heart cath today   4. Left lower extremity pain and swelling - last echo with  normal EF (01/2018) - will obtain lower extremity duplex to rule out DVT   5. Hyperlipidemia 03/18/2019: Cholesterol 152; HDL 28.30; LDL Cholesterol 107; Triglycerides 80.0; VLDL 16.0 - not at goal, previous LDL in the 60s - will need to increase lipitor from 20 mg to at least 40 mg - recheck lipids in 6 weeks   6. Hypertension - previously controlled on BB, HCTZ    Severity of Illness: The appropriate patient status for this patient is INPATIENT. Inpatient status is judged to be reasonable and necessary in order to provide the required intensity of service to ensure the patient's safety. The patient's presenting symptoms, physical exam findings, and initial radiographic and laboratory data in the context of their chronic comorbidities is felt to place them at high risk for further clinical deterioration. Furthermore, it is not anticipated that the patient will be medically stable for discharge from the hospital within 2 midnights of admission. The following factors support the patient status of inpatient.   " The patient's presenting symptoms include unstable angina. " The worrisome physical exam findings include [place]. " The initial radiographic and laboratory data are worrisome because of abnormal EKG. " The chronic co-morbidities include obesity, CAD.   * I certify that at the point of admission it is my clinical judgment that the patient will require inpatient hospital care spanning beyond 2 midnights from the point of admission due to high intensity of service, high risk for further deterioration and high frequency of surveillance required.*    For questions or updates, please contact [place] Please consult [email or web address] for contact info under        Signed, Ledora Bottcher, PA  04/03/2019 10:27 AM   I have examined the patient and reviewed assessment and plan and discussed with patient.  Agree with above as stated.  Symptoms concerning for unstable angina in this  patient with known coronary artery disease.  Plan for cardiac catheterization.  She is agreeable.  There is difficulty navigating the right wrist in the past.  Right groin was the preferable approach for her.  Prior cath report from 2017 reviewed.  Larae Grooms

## 2019-04-03 NOTE — ED Provider Notes (Signed)
[facility] EMERGENCY DEPARTMENT Provider Note   CSN: [id number] Arrival date & time: 04/03/19  [id number]     History Chief Complaint  Patient presents with  . Chest Pain    [name] is a 74 y.o. female.  74 yo F with a chief complaint of chest pain.  Woke her up from sleep at about 4 AM.  This is about 4-1/2 hours ago.  Feels like when she has had a heart attack in the past.  Has a history of 4 stents.  Described it as pressure just right of midline that radiated to both shoulders and down the right arm.  Had diaphoresis with this.  Denied radiation to the back.  Denied nausea or vomiting.  Denied shortness of breath.  She took 2 nitros with some significant improvement with some significant improvement but it reoccurred and she called 911.  Feels that the pain now is about a 5 out of 10.  Feels like a pressure on the right side.  The history is provided by the patient.  Chest Pain Pain location:  R chest Pain quality: pressure   Pain radiates to:  R shoulder, L shoulder and R arm Pain severity:  Severe Onset quality:  Sudden Duration:  4 hours Timing:  Rare Progression:  Resolved Chronicity:  New Relieved by:  Nitroglycerin Worsened by:  Nothing Ineffective treatments:  None tried Associated symptoms: diaphoresis   Associated symptoms: no dizziness, no fever, no headache, no nausea, no palpitations, no shortness of breath and no vomiting        Past Medical History:  Diagnosis Date  . AAA (abdominal aortic aneurysm) ([place])    a. s/p stent graft repair 01/2016.  [name] Allergic rhinitis, cause unspecified   . Anxiety state, unspecified   . Chronic LBP   . Coronary artery disease    a. inferior MI 1998 s/p PCI of RCA. b. stenting of Cx 04/2010. c. DES to prox LAD 05/2013. d. DES x 3 in 10/2015. // Myoview 07/2018:  EF 59, normal perfusion, low risk   . Degeneration of lumbar or lumbosacral intervertebral disc   . Depressive disorder, not elsewhere classified    . Diabetes mellitus    TYPE II  . Gout 08/22/2013  . Hyperlipidemia   . Hypertension   . Hypothyroidism   . Lower GI bleed 06/2010   Diverticular bleed  . Noncompliance with medications 02/26/2014  . Obesity, unspecified     Patient Active Problem List   Diagnosis Date Noted  . Rash 11/22/2018  . Thoracic disc disease 11/19/2018  . Lumbar spinal stenosis 11/19/2018  . Right hand paresthesia 09/15/2018  . Right leg swelling 01/16/2018  . Edema 10/25/2017  . Venous insufficiency 07/19/2017  . B12 deficiency 07/19/2017  . Bilateral hearing loss 07/19/2017  . Iron deficiency anemia 06/13/2017  . Coronary artery disease   . Bilateral swelling of feet 05/29/2017  . Pharyngitis 01/11/2017  . Vaginal yeast infection 07/05/2016  . Lower abdominal pain 07/05/2016  . Chronic low back pain 06/28/2016  . Insomnia 06/28/2016  . History of endovascular stent graft for abdominal aortic aneurysm (AAA) 01/22/2016  . Lumbar radiculopathy 05/15/2015  . Acute sinus infection 05/14/2015  . Arthritis of left hip 04/16/2015  . Acute gout 01/16/2015  . Acute respiratory infection 06/09/2014  . Angioedema of lips 06/03/2014  . Frequent urination 05/08/2014  . Acute epiglottitis without obstruction 05/05/2014  . Angioedema   . Neck swelling   . Abdominal tenderness  03/26/2014  . Noncompliance with medications 02/26/2014  . AAA (abdominal aortic aneurysm) ([place])   . Chest pain 02/24/2014  . Jaw pain 09/25/2013  . Myalgia 09/25/2013  . Gout 08/22/2013  . Abdominal pain, unspecified site 08/13/2013  . Musculoskeletal pain 08/04/2013  . Aftercare following surgery of the circulatory system, [place] 06/25/2013  . Peripheral vascular disease, unspecified ([place]) 06/19/2012  . Dry cough 03/05/2012  . Dysuria 04/21/2011  . Low back pain 10/04/2010  . Acute ischemic colitis ([place]) 07/29/2010  . Lower GI bleed 07/23/2010  . Liver lesion 07/23/2010  . Encounter for well adult exam with abnormal findings  07/22/2010  . URTICARIA 09/23/2009  . TOBACCO ABUSE 11/18/2008  . Obesity 11/17/2008  . MENOPAUSAL DISORDER 10/29/2007  . ANXIETY 06/22/2007  . DEPRESSION 06/22/2007  . OTITIS MEDIA, SEROUS, CHRONIC 04/23/2007  . Constipation 02/07/2007  . [place] DISEASE, LUMBOSACRAL SPINE 02/07/2007  . Diabetes ([place]) 11/03/2006  . Allergic rhinitis 11/03/2006  . Hypothyroidism 07/31/2006  . Hyperlipidemia 07/31/2006  . Essential hypertension 07/31/2006  . Coronary artery disease involving native heart without angina pectoris 07/31/2006    Past Surgical History:  Procedure Laterality Date  . CARDIAC CATHETERIZATION     PCI OF BOTH THE CIRCUMFLEX AND LEFT ANTERIOR DESCENDING ARTERY  . CARDIAC CATHETERIZATION N/A 10/29/2015   Procedure: Coronary Stent Intervention;  Surgeon: Nelva Bush, MD;  Location: [facility] CV LAB;  Service: Cardiovascular;  Laterality: N/A;  . CARDIAC CATHETERIZATION N/A 10/29/2015   Procedure: Coronary/Graft Angiography;  Surgeon: Nelva Bush, MD;  Location: [facility] CV LAB;  Service: Cardiovascular;  Laterality: N/A;  . CARDIAC CATHETERIZATION N/A 10/29/2015   Procedure: Intravascular Pressure Wire/FFR Study;  Surgeon: Nelva Bush, MD;  Location: [facility] CV LAB;  Service: Cardiovascular;  Laterality: N/A;  . CESAREAN SECTION    . ENDOVASCULAR STENT INSERTION N/A 01/22/2016   Procedure: ABDOMINAL AORTIC ENDOVASCULAR STENT GRAFT INSERTION;  Surgeon: Rosetta Posner, MD;  Location: [facility];  Service: Vascular;  Laterality: N/A;  . HEART STENT  04-2010  and  Jun 07, 2013   X 3  . LEFT HEART CATHETERIZATION WITH CORONARY ANGIOGRAM N/A 06/07/2013   Procedure: LEFT HEART CATHETERIZATION WITH CORONARY ANGIOGRAM;  Surgeon: [name], MD;  Location: [facility] CATH LAB;  Service: Cardiovascular;  Laterality: N/A;  . LEFT HEART CATHETERIZATION WITH CORONARY ANGIOGRAM N/A 02/25/2014   Procedure: LEFT HEART CATHETERIZATION WITH CORONARY ANGIOGRAM;  Surgeon: Troy Sine,  MD;  Location: [facility] CATH LAB;  Service: Cardiovascular;  Laterality: N/A;  . LUMBAR FUSION  01/2007   DR. Patrice Paradise...3-LEVEL WITH FIXATION  . OOPHORECTOMY     BSO? pt.unsure  . PARATHYROIDECTOMY    . SPINE SURGERY    . THYROIDECTOMY    . TOTAL ABDOMINAL HYSTERECTOMY       OB History    Gravida  3   Para  2   Term      Preterm      AB  1   Living  3     SAB  1   TAB      Ectopic      Multiple  1   Live Births              Family History  Problem Relation Age of Onset  . Heart attack Mother 49       s/p D&C-CARDIAC ARREST 1966  . Heart disease Mother   . Heart attack Father 55       1978 WITH MI  .  Heart disease Father   . Diabetes Brother   . Diabetes Maternal Aunt   . Arthritis Maternal Aunt   . Breast cancer Maternal Aunt        Post menopausal  . Colon cancer Neg Hx     Social History   Tobacco Use  . Smoking status: Former Smoker    Years: 30.00    Types: Cigarettes    Quit date: 05/31/1986    Years since quitting: 32.8  . Smokeless tobacco: Never Used  Substance Use Topics  . Alcohol use: No  . Drug use: No    Home Medications Prior to Admission medications   Medication Sig Start Date End Date Taking? Authorizing Provider  allopurinol (ZYLOPRIM) 300 MG tablet Take 1 tablet (300 mg total) by mouth daily. 04/14/16   Biagio Borg, MD  aspirin 81 MG EC tablet Take 81 mg by mouth daily.     [provider]  atenolol (TENORMIN) 25 MG tablet TAKE 1 TABLET(25 MG) BY MOUTH TWICE DAILY 06/15/18   Biagio Borg, MD  atorvastatin (LIPITOR) 20 MG tablet Take 1 tablet (20 mg total) by mouth daily. 09/12/18 09/12/19  Biagio Borg, MD  celecoxib (CELEBREX) 200 MG capsule as needed. 09/29/17   [provider]  cyanocobalamin 1000 MCG tablet Take 1 tablet (1,000 mcg total) by mouth daily. 03/18/19   Biagio Borg, MD  cyclobenzaprine (FLEXERIL) 5 MG tablet Take 5 mg by mouth 3 (three) times daily as needed. 11/20/18   [provider]   docusate sodium (COLACE) 100 MG capsule Take 1 capsule (100 mg total) by mouth 2 (two) times daily as needed for mild constipation. 03/04/19   Joseph Pierini, MD  fexofenadine (ALLEGRA) 180 MG tablet Take 1 tablet (180 mg total) by mouth daily. 08/03/18 08/03/19  Biagio Borg, MD  gabapentin (NEURONTIN) 300 MG capsule 1-2 tab by mouth at bedtime 11/19/18   Biagio Borg, MD  hydrochlorothiazide (MICROZIDE) 12.5 MG capsule Take 1 capsule (12.5 mg total) by mouth daily. 10/25/17   Biagio Borg, MD  hydrocortisone 1 % lotion Apply 1 application topically 2 (two) times daily. Apply to affected area 03/04/19   Joseph Pierini, MD  metFORMIN (GLUCOPHAGE-XR) 500 MG 24 hr tablet TAKE 4 TABLETS BY MOUTH EVERY MORNING 01/11/17   Biagio Borg, MD  methocarbamol (ROBAXIN) 500 MG tablet  07/26/18   [provider]  nitroGLYCERIN (NITROSTAT) 0.4 MG SL tablet Place 1 tablet (0.4 mg total) under the tongue every 5 (five) minutes as needed for chest pain. 12/31/18   Richardson Dopp T, PA-C  oxyCODONE (OXY IR/ROXICODONE) 5 MG immediate release tablet 4 (four) times daily as needed. 10/01/17   [provider]  pantoprazole (PROTONIX) 40 MG tablet Take 40 mg by mouth daily.    [provider]  pregabalin (LYRICA) 75 MG capsule Take 75 mg by mouth as needed. 11/20/18   [provider]  SYNTHROID 150 MCG tablet TAKE 1 TABLET(150 MCG) BY MOUTH DAILY 02/01/19   Biagio Borg, MD  tiZANidine (ZANAFLEX) 2 MG tablet Take 1 tablet (2 mg total) by mouth every 6 (six) hours as needed for muscle spasms. 03/13/18   Biagio Borg, MD  triamcinolone (NASACORT) 55 MCG/ACT AERO nasal inhaler Place 2 sprays into the nose daily. 08/03/18   Biagio Borg, MD  Vitamin D, Cholecalciferol, 25 MCG (1000 UT) CAPS Take 1 capsule by mouth daily. 03/04/19   Joseph Pierini, MD  Allergies    Prilosec [omeprazole], Miconazole nitrate, Crestor [rosuvastatin calcium], Augmentin [amoxicillin-pot clavulanate], and  Doxycycline  Review of Systems   Review of Systems  Constitutional: Positive for diaphoresis. Negative for chills and fever.  HENT: Negative for congestion and rhinorrhea.   Eyes: Negative for redness and visual disturbance.  Respiratory: Negative for shortness of breath and wheezing.   Cardiovascular: Positive for chest pain. Negative for palpitations.  Gastrointestinal: Negative for nausea and vomiting.  Genitourinary: Negative for dysuria and urgency.  Musculoskeletal: Positive for arthralgias. Negative for myalgias.  Skin: Negative for pallor and wound.  Neurological: Negative for dizziness and headaches.    Physical Exam Updated Vital Signs BP 121/70   Pulse 66   Temp 98.1 F (36.7 C) (Oral)   Resp 18   Ht 5\' 5"  (1.651 m)   Wt 102.1 kg   SpO2 99%   BMI 37.44 kg/m   Physical Exam Vitals and nursing note reviewed.  Constitutional:      General: She is not in acute distress.    Appearance: She is well-developed. She is obese. She is not diaphoretic.  HENT:     Head: Normocephalic and atraumatic.  Eyes:     Pupils: Pupils are equal, round, and reactive to light.  Cardiovascular:     Rate and Rhythm: Normal rate and regular rhythm.     Heart sounds: No murmur. No friction rub. No gallop.   Pulmonary:     Effort: Pulmonary effort is normal.     Breath sounds: No wheezing or rales.  Chest:     Chest wall: Tenderness (to the right chest wall, different pain then woke her from sleep) present.  Abdominal:     General: There is no distension.     Palpations: Abdomen is soft.     Tenderness: There is no abdominal tenderness.  Musculoskeletal:        General: No tenderness.     Cervical back: Normal range of motion and neck supple.  Skin:    General: Skin is warm and dry.  Neurological:     Mental Status: She is alert and oriented to person, place, and time.  Psychiatric:        Behavior: Behavior normal.     ED Results / Procedures / Treatments   Labs (all  labs ordered are listed, but only abnormal results are displayed) Labs Reviewed  BASIC METABOLIC PANEL - Abnormal; Notable for the following components:      Result Value   Glucose, Bld 116 (*)    Calcium 8.6 (*)    All other components within normal limits  CBC - Abnormal; Notable for the following components:   HCT 46.4 (*)    RDW 15.6 (*)    All other components within normal limits  RESPIRATORY PANEL BY RT PCR (FLU A&B, COVID)  HEPARIN LEVEL (UNFRACTIONATED)  TROPONIN I (HIGH SENSITIVITY)  TROPONIN I (HIGH SENSITIVITY)    EKG EKG Interpretation  Date/Time:  Wednesday April 03 2019 07:38:13 EDT Ventricular Rate:  69 PR Interval:  150 QRS Duration: 140 QT Interval:  428 QTC Calculation: 458 R Axis:   -100 Text Interpretation: Normal sinus rhythm Right bundle branch block Abnormal ECG with QRS widening Otherwise no significant change Confirmed by Deno Etienne [phone]) on 04/03/2019 8:33:04 AM   Radiology DG Chest 2 View  Result Date: 04/03/2019 CLINICAL DATA:  Chest pain EXAM: CHEST - 2 VIEW COMPARISON:  06/11/2017 FINDINGS: The heart size and mediastinal contours are within normal limits.  Both lungs are clear. Disc degenerative disease of the thoracic spine. Extensive, partially imaged posterior thoracolumbar fusion. IMPRESSION: No acute abnormality of the lungs. Electronically Signed   By: Eddie Candle M.D.   On: 04/03/2019 08:18    Procedures Procedures (including critical care time)  Medications Ordered in ED Medications  nitroGLYCERIN 50 mg in dextrose 5 % 250 mL (0.2 mg/mL) infusion (10 mcg/min Intravenous Rate/Dose Change 04/03/19 0939)  heparin bolus via infusion 4,000 Units (has no administration in time range)  heparin ADULT infusion 100 units/mL (25000 units/239mL sodium chloride 0.45%) (has no administration in time range)  sodium chloride flush (NS) 0.9 % injection 3 mL (3 mLs Intravenous Given 04/03/19 0854)    ED Course  I have reviewed the triage vital signs  and the nursing notes.  Pertinent labs & imaging results that were available during my care of the patient were reviewed by me and considered in my medical decision making (see chart for details).    MDM Rules/Calculators/A&P                      74 yo F with a chief complaints of chest pain.  This woke her up from sleep about 4 and half hours ago.  Has some concerning features with bilateral shoulder radiation and diaphoresis.  Has a history of 4 stents placed though has been sometime since last intervention.  Given 324 aspirin in route with EMS.  Complaining that her pain is still about 5 out of 10.  Will start on nitro/ heparin drip.  To wait for troponin.   Initial trop negative. Discuss with cardiology.    Likely admit.    CRITICAL CARE Performed by: Cecilio Asper   Total critical care time: 35 minutes  Critical care time was exclusive of separately billable procedures and treating other patients.  Critical care was necessary to treat or prevent imminent or life-threatening deterioration.  Critical care was time spent personally by me on the following activities: development of treatment plan with patient and/or surrogate as well as nursing, discussions with consultants, evaluation of patient's response to treatment, examination of patient, obtaining history from patient or surrogate, ordering and performing treatments and interventions, ordering and review of laboratory studies, ordering and review of radiographic studies, pulse oximetry and re-evaluation of patient's condition.  The patients results and plan were reviewed and discussed.   Any x-rays performed were independently reviewed by myself.   Differential diagnosis were considered with the presenting HPI.  Medications  nitroGLYCERIN 50 mg in dextrose 5 % 250 mL (0.2 mg/mL) infusion (10 mcg/min Intravenous Rate/Dose Change 04/03/19 0939)  heparin bolus via infusion 4,000 Units (has no administration in time range)   heparin ADULT infusion 100 units/mL (25000 units/233mL sodium chloride 0.45%) (has no administration in time range)  sodium chloride flush (NS) 0.9 % injection 3 mL (3 mLs Intravenous Given 04/03/19 0854)    Vitals:   04/03/19 0845 04/03/19 0855 04/03/19 0900 04/03/19 0915  BP: 117/68  115/67 121/70  Pulse: 66     Resp: 18  (!) 24 18  Temp:      TempSrc:      SpO2: 99%     Weight:  102.1 kg    Height:  5\' 5"  (1.651 m)      Final diagnoses:  Unstable angina ([place])    Admission/ observation were discussed with the admitting physician, patient and/or family and they are comfortable with the plan.    Final  Clinical Impression(s) / ED Diagnoses Final diagnoses:  Unstable angina [facility])    Rx / DC Orders ED Discharge Orders    None       Deno Etienne, DO 04/03/19 1003

## 2019-04-03 NOTE — Progress Notes (Signed)
Pt leaves cath lab holding area in stable condition. Lt radial site is unremarkable. Air is out of band, but waiting 2min window to be removed. Site is CDI.

## 2019-04-03 NOTE — Interval H&P Note (Signed)
History and Physical Interval Note:  04/03/2019 2:59 PM  [name]  has presented today for cardiac catheterization, with the diagnosis of unstable angina.  The various methods of treatment have been discussed with the patient and family. After consideration of risks, benefits and other options for treatment, the patient has consented to  Procedure(s): LEFT HEART CATH AND CORONARY ANGIOGRAPHY (N/A) as a surgical intervention.  The patient's history has been reviewed, patient examined, no change in status, stable for surgery.  I have reviewed the patient's chart and labs.  Questions were answered to the patient's satisfaction.    Cath Lab Visit (complete for each Cath Lab visit)  Clinical Evaluation Leading to the Procedure:   ACS: Yes.    Non-ACS:  N/A  Ponce Skillman

## 2019-04-04 ENCOUNTER — Telehealth: Payer: Self-pay

## 2019-04-04 DIAGNOSIS — M79605 Pain in left leg: Secondary | ICD-10-CM

## 2019-04-04 LAB — CBC
HCT: 39.9 % (ref 36.0–46.0)
Hemoglobin: 12.5 g/dL (ref 12.0–15.0)
MCH: 27.7 pg (ref 26.0–34.0)
MCHC: 31.3 g/dL (ref 30.0–36.0)
MCV: 88.3 fL (ref 80.0–100.0)
Platelets: 236 10*3/uL (ref 150–400)
RBC: 4.52 MIL/uL (ref 3.87–5.11)
RDW: 15.4 % (ref 11.5–15.5)
WBC: 7.3 10*3/uL (ref 4.0–10.5)
nRBC: 0 % (ref 0.0–0.2)

## 2019-04-04 LAB — LIPID PANEL
Cholesterol: 134 mg/dL (ref 0–200)
HDL: 23 mg/dL — ABNORMAL LOW (ref >40)
LDL Cholesterol: 95 mg/dL (ref 0–99)
Total CHOL/HDL Ratio: 5.8 RATIO
Triglycerides: 80 mg/dL (ref <150)
VLDL: 16 mg/dL (ref 0–40)

## 2019-04-04 LAB — BASIC METABOLIC PANEL
Anion gap: 10 (ref 5–15)
BUN: 10 mg/dL (ref 8–23)
CO2: 26 mmol/L (ref 22–32)
Calcium: 8.1 mg/dL — ABNORMAL LOW (ref 8.9–10.3)
Chloride: 108 mmol/L (ref 98–111)
Creatinine, Ser: 0.92 mg/dL (ref 0.44–1.00)
GFR calc Af Amer: >60 mL/min (ref >60)
GFR calc non Af Amer: >60 mL/min (ref >60)
Glucose, Bld: 124 mg/dL — ABNORMAL HIGH (ref 70–99)
Potassium: 4 mmol/L (ref 3.5–5.1)
Sodium: 144 mmol/L (ref 135–145)

## 2019-04-04 LAB — GLUCOSE, CAPILLARY
Glucose-Capillary: 190 mg/dL — ABNORMAL HIGH (ref 70–99)
Glucose-Capillary: 107 mg/dL — ABNORMAL HIGH (ref 70–99)

## 2019-04-04 MED ORDER — ISOSORBIDE MONONITRATE ER 30 MG PO TB24: 30.0000 mg | ORAL_TABLET | Freq: Every day | ORAL | 3 refills | Status: DC

## 2019-04-04 MED ORDER — METFORMIN HCL ER 500 MG PO TB24: ORAL_TABLET | ORAL | 3 refills | Status: DC

## 2019-04-04 MED ORDER — EZETIMIBE 10 MG PO TABS
10.0000 mg | ORAL_TABLET | Freq: Every day | ORAL | Status: DC
  Administered 2019-04-04: 10 mg via ORAL
  Filled 2019-04-04: qty 1

## 2019-04-04 MED ORDER — EZETIMIBE 10 MG PO TABS: 10.0000 mg | ORAL_TABLET | Freq: Every day | ORAL | 3 refills | Status: DC

## 2019-04-04 NOTE — Telephone Encounter (Signed)
-----   Message from Ledora Bottcher, [place] sent at 04/04/2019 10:16 AM EDT ----- Pt discharging today. Needs a TOC call.  Thanks Angie

## 2019-04-04 NOTE — Telephone Encounter (Signed)
Per Fabian Sharp PA...Marland KitchenMarland KitchenPt being D/C'd 04/04/19 with post hospital planned for 04/17/19 with Richardson Dopp PA.  Will call the pt for TOC call 04/05/19.

## 2019-04-04 NOTE — Discharge Summary (Addendum)
Discharge Summary    Patient ID: [name] MRN: [id number]; DOB: 11-24-1945  Admit date: 04/03/2019 Discharge date: 04/04/2019  Primary Care Provider: Biagio Borg, MD  Primary Cardiologist: Sherren Mocha, MD  Primary Electrophysiologist:  None   Discharge Diagnoses    Principal Problem:   Unstable angina [facility]) Active Problems:   Diabetes ([place])   Hyperlipidemia   Obesity   Anxiety state   TOBACCO ABUSE   Essential hypertension   Coronary artery disease involving native heart without angina pectoris    Diagnostic Studies/Procedures    Left heart cath 04/03/19: Conclusions: Multivessel coronary artery disease, as detailed below.  There may have been slight interval progression in the mid/distal RCA disease, though otherwise the angiographic appearance of the coronary arteries is similar to the most recent study in 10/2015. Normal left ventricular systolic function with mildly elevated filling pressure.   Recommendations: Images reviewed with interventional team.  We agree that the distal RCA is diffusely disease and supplies relatively small/diseased PDA and PL branches; this is not an ideal target for PCI.  Other disease, the most severe of which affects the small/distal branches, has not changed significantly since 2017. Start isosorbide mononitrate 30 mg daily and wean nitroglycerin infusion, as tolerated.  Reserve PCI to RCA and/or LCx for refractory symptoms. Aggressive secondary prevention, including high-intensity statin therapy. _____________   Lower extremity duplex 04/03/19: RIGHT:  - No evidence of common femoral vein obstruction.     LEFT:  - No evidence of deep vein thrombosis in the lower extremity. No indirect  evidence of obstruction proximal to the inguinal ligament.  - Findings appear essentially unchanged compared to previous examination.   History of Present Illness     [name] is a 74 y.o. female with a history of CAD with multiple  interventions, DM, HTN, HLD, and AAA repair s/p EVAR (2018) presented to [facility] with chest pain that woke her from sleep.  Ms. Wedding has CAD with initial presentation as an inferior MI in 1998 treated with RCA stent. She also has stents in her LCx (2012), LAD (2015), multiple stents to RCA (2017). Stress tests 11/2016 and 07/2018 negative for reversible ischemia. She was last seen in clinic 02/11/19 by Dr. Burt Knack. At that time, she described chest pain requiring nitro x 3 three weeks prior. Her anginal equivalent is pain under her right breast. She has been maintained on ASA, atenolol, and lipitor.    She presented to [facility] this morning after an episode of 10/10 chest pain work her from sleep at 0430. CP is described as a pressure that started under her right breast and radiated across both shoulders and down her right arm. No SOB or diaphoresis. She took nitro x 3 with some improvement in CP. She then drank some coke to help her belch, which relieved her CP. She was able to go back to sleep, but woke up again at 0630 with recurrent chest pain prompting her to call EMS. She is now on a nitro drip without CP.    She also relays that she is very nervous because her brother died in his sleep last 2022-06-12 from a suspected MI.    She reports swelling and pain in her left lower extremity that started 2-3 days ago, not relieved with her voltaren cream. Pain in her calf and thigh.    HS troponin negative. She has a history of negative enzymes at that time of her interventions.  Hospital Course  Consultants: none  Unstable angina CAD s/p multiple interventions Patient was admitted to cardiology with concerns for unstable angina. Left heart cath performed showed initially unchanged disease from prior study in 2017. Distal RCA may have some progression of disease, but it is not a good target for PCI. Imdur 30 mg was added to her regimen and nitro drip weaned. She tolerated the procedure well. She is now chest pain  free.  Continue ASA, statin, BB, and HCTZ, with new imdur.   Hypertension Medications as above.    Hyperlipidemia with LDL goal < 70 04/04/2019: Cholesterol 134; HDL 23; LDL Cholesterol 95; Triglycerides 80; VLDL 16 Continue 80 mg lipitor. LDL not at goal. Will add  Will add zetia. May need to see lipid clinic.   DM Resume metformin in 48 hrs.  A1c is 6.6%    Did the patient have an acute coronary syndrome (MI, NSTEMI, STEMI, etc) this admission?:  No                               Did the patient have a percutaneous coronary intervention (stent / angioplasty)?:  No.   _____________  Discharge Vitals Blood pressure 121/68, pulse 73, temperature 98.4 F (36.9 C), temperature source Oral, resp. rate 15, height 5\' 5"  (1.651 m), weight 100.6 kg, SpO2 96 %.  Filed Weights   04/03/19 0855 04/04/19 0454  Weight: 102.1 kg 100.6 kg    Labs & Radiologic Studies    CBC Recent Labs    04/03/19 0738 04/04/19 0411  WBC 9.2 7.3  HGB 14.2 12.5  HCT 46.4* 39.9  MCV 91.7 88.3  PLT 274 [id number]   Basic Metabolic Panel Recent Labs    04/03/19 0738 04/04/19 0411  NA 139 144  K 3.5 4.0  CL 101 108  CO2 25 26  GLUCOSE 116* 124*  BUN 12 10  CREATININE 0.84 0.92  CALCIUM 8.6* 8.1*   Liver Function Tests No results for input(s): AST, ALT, ALKPHOS, BILITOT, PROT, ALBUMIN in the last 72 hours. No results for input(s): LIPASE, AMYLASE in the last 72 hours. High Sensitivity Troponin:   Recent Labs  Lab 04/03/19 0738 04/03/19 1103  TROPONINIHS 7 13    BNP Invalid input(s): POCBNP D-Dimer No results for input(s): DDIMER in the last 72 hours. Hemoglobin A1C No results for input(s): HGBA1C in the last 72 hours. Fasting Lipid Panel Recent Labs    04/04/19 0411  CHOL 134  HDL 23*  LDLCALC 95  TRIG 80  CHOLHDL 5.8   Thyroid Function Tests No results for input(s): TSH, T4TOTAL, T3FREE, THYROIDAB in the last 72 hours.  Invalid input(s): FREET3 _____________  DG Chest 2  View  Result Date: 04/03/2019 CLINICAL DATA:  Chest pain EXAM: CHEST - 2 VIEW COMPARISON:  06/11/2017 FINDINGS: The heart size and mediastinal contours are within normal limits. Both lungs are clear. Disc degenerative disease of the thoracic spine. Extensive, partially imaged posterior thoracolumbar fusion. IMPRESSION: No acute abnormality of the lungs. Electronically Signed   By: Eddie Candle M.D.   On: 04/03/2019 08:18   CARDIAC CATHETERIZATION  Result Date: 04/03/2019 Conclusions: 1. Multivessel coronary artery disease, as detailed below.  There may have been slight interval progression in the mid/distal RCA disease, though otherwise the angiographic appearance of the coronary arteries is similar to the most recent study in 10/2015. 2. Normal left ventricular systolic function with mildly elevated filling pressure. Recommendations: 1. Images reviewed with  interventional team.  We agree that the distal RCA is diffusely disease and supplies relatively small/diseased PDA and PL branches; this is not an ideal target for PCI.  Other disease, the most severe of which affects the small/distal branches, has not changed significantly since 2017. 2. Start isosorbide mononitrate 30 mg daily and wean nitroglycerin infusion, as tolerated.  Reserve PCI to RCA and/or LCx for refractory symptoms. 3. Aggressive secondary prevention, including high-intensity statin therapy. Nelva Bush, MD [facility]   VAS [place] LOWER EXTREMITY VENOUS (DVT)  Result Date: 04/03/2019  Lower Venous DVT Study Indications: Left groin pain.  Comparison Study: Prior negative BLE venous study done 10/20/2017 Performing Technologist: Sharion Dove RVS  Examination Guidelines: A complete evaluation includes B-mode imaging, spectral Doppler, color Doppler, and power Doppler as needed of all accessible portions of each vessel. Bilateral testing is considered an integral part of a complete examination. Limited examinations for reoccurring  indications may be performed as noted. The reflux portion of the exam is performed with the patient in reverse Trendelenburg.  +-----+---------------+---------+-----------+----------+--------------+ RIGHTCompressibilityPhasicitySpontaneityPropertiesThrombus Aging +-----+---------------+---------+-----------+----------+--------------+ CFV  Full           Yes      Yes                                 +-----+---------------+---------+-----------+----------+--------------+   +---------+---------------+---------+-----------+----------+--------------+ LEFT     CompressibilityPhasicitySpontaneityPropertiesThrombus Aging +---------+---------------+---------+-----------+----------+--------------+ CFV      Full           Yes      Yes                                 +---------+---------------+---------+-----------+----------+--------------+ SFJ      Full                                                        +---------+---------------+---------+-----------+----------+--------------+ FV Prox  Full                                                        +---------+---------------+---------+-----------+----------+--------------+ FV Mid   Full                                                        +---------+---------------+---------+-----------+----------+--------------+ FV DistalFull                                                        +---------+---------------+---------+-----------+----------+--------------+ PFV      Full                                                        +---------+---------------+---------+-----------+----------+--------------+  POP      Full           Yes      Yes                                 +---------+---------------+---------+-----------+----------+--------------+ PTV      Full                                                        +---------+---------------+---------+-----------+----------+--------------+ PERO     Full                                                         +---------+---------------+---------+-----------+----------+--------------+     Summary: RIGHT: - No evidence of common femoral vein obstruction.  LEFT: - No evidence of deep vein thrombosis in the lower extremity. No indirect evidence of obstruction proximal to the inguinal ligament. - Findings appear essentially unchanged compared to previous examination.  *See table(s) above for measurements and observations. Electronically signed by Curt Jews MD on 04/03/2019 at 3:50:03 PM.    Final    Disposition   Pt is being discharged home today in good condition.  Follow-up Plans & Appointments    Follow-up Information     Liliane Shi, PA-C Follow up on 04/17/2019.   Specialties: Cardiology, Physician Assistant Why: 2:15 pm TOC Contact information: [id number] [place] [address] [phone]           Discharge Instructions     Diet - low sodium heart healthy   Complete by: As directed    Discharge instructions   Complete by: As directed    No driving for 2 days. No lifting over 5 lbs for 1 week. No sexual activity for 1 week. Keep procedure site clean & dry. If you notice increased pain, swelling, bleeding or pus, call/return!  You may shower, but no soaking baths/hot tubs/pools for 1 week.   Increase activity slowly   Complete by: As directed        Discharge Medications   Allergies as of 04/04/2019       Reactions   Prilosec [omeprazole] Other (See Comments)   Chest pain   Miconazole Nitrate Hives   REACTION: hives   Crestor [rosuvastatin Calcium] Other (See Comments)   Feeling poor   Augmentin [amoxicillin-pot Clavulanate] Hives, Itching, Rash   Has patient had a PCN reaction causing immediate rash, facial/tongue/throat swelling, SOB or lightheadedness with hypotension:unsure Has patient had a PCN reaction causing severe rash involving mucus membranes or skin necrosis:unsure Has patient  had a PCN reaction that required hospitalization:No Has patient had a PCN reaction occurring within the last 10 years:NO If all of the above answers are "NO", then may proceed with Cephalosporin use. Has patient had a PCN reaction causing immediate rash, facial/tongue/throat swelling, SOB or lightheadedness with hypotension:unsure Has patient had a PCN reaction causing severe rash involving mucus membranes or skin necrosis:unsure Has patient had a PCN reaction that required hospitalization:No Has patient had a PCN reaction occurring within the last 10 years:NO If all of the above answers  are "NO", then may proceed with Cephalosporin use.   Doxycycline Other (See Comments)   REACTION: gi upset        Medication List     TAKE these medications    allopurinol 300 MG tablet Commonly known as: ZYLOPRIM Take 1 tablet (300 mg total) by mouth daily. What changed:  when to take this reasons to take this   aspirin 81 MG EC tablet Take 81 mg by mouth daily.   atenolol 25 MG tablet Commonly known as: TENORMIN TAKE 1 TABLET(25 MG) BY MOUTH TWICE DAILY What changed:  how much to take how to take this when to take this   atorvastatin 20 MG tablet Commonly known as: Lipitor Take 1 tablet (20 mg total) by mouth daily.   cyanocobalamin 1000 MCG tablet Take 1 tablet (1,000 mcg total) by mouth daily.   cyclobenzaprine 5 MG tablet Commonly known as: FLEXERIL Take 5 mg by mouth 3 (three) times daily as needed.   docusate sodium 100 MG capsule Commonly known as: Colace Take 1 capsule (100 mg total) by mouth 2 (two) times daily as needed for mild constipation.   ezetimibe 10 MG tablet Commonly known as: ZETIA Take 1 tablet (10 mg total) by mouth daily. Start taking on: April 05, 2019   fexofenadine 180 MG tablet Commonly known as: ALLEGRA Take 1 tablet (180 mg total) by mouth daily. What changed:  when to take this reasons to take this   gabapentin 300 MG capsule Commonly  known as: NEURONTIN 1-2 tab by mouth at bedtime   hydrochlorothiazide 12.5 MG capsule Commonly known as: MICROZIDE Take 1 capsule (12.5 mg total) by mouth daily.   hydrocortisone 1 % lotion Apply 1 application topically 2 (two) times daily. Apply to affected area   isosorbide mononitrate 30 MG 24 hr tablet Commonly known as: IMDUR Take 1 tablet (30 mg total) by mouth daily. Start taking on: April 05, 2019   metFORMIN 500 MG 24 hr tablet Commonly known as: GLUCOPHAGE-XR TAKE 4 TABLETS BY MOUTH EVERY MORNING. Resume on 04/06/19. What changed: additional instructions   nitroGLYCERIN 0.4 MG SL tablet Commonly known as: NITROSTAT Place 1 tablet (0.4 mg total) under the tongue every 5 (five) minutes as needed for chest pain.   oxyCODONE 5 MG immediate release tablet Commonly known as: Oxy IR/ROXICODONE 4 (four) times daily as needed.   pantoprazole 40 MG tablet Commonly known as: PROTONIX Take 40 mg by mouth daily.   pregabalin 75 MG capsule Commonly known as: LYRICA Take 75 mg by mouth daily as needed (pain).   Synthroid 150 MCG tablet Generic drug: levothyroxine TAKE 1 TABLET(150 MCG) BY MOUTH DAILY What changed: See the new instructions.   tiZANidine 2 MG tablet Commonly known as: ZANAFLEX Take 1 tablet (2 mg total) by mouth every 6 (six) hours as needed for muscle spasms.   triamcinolone 55 MCG/ACT Aero nasal inhaler Commonly known as: NASACORT Place 2 sprays into the nose daily.   Vitamin D (Cholecalciferol) 25 MCG (1000 UT) Caps Take 1 capsule by mouth daily.           Outstanding Labs/Studies   TOC  Duration of Discharge Encounter   Greater than 30 minutes including physician time.  Signed, Tami Lin Duke, PA 04/04/2019, 10:16 AM   I have examined the patient and reviewed assessment and plan and discussed with patient.  Agree with above as stated.  On exam, left radial site has a 2+ pulse with no hematoma.  RRR,  S1, S2.  Palpable pedal pulses  bilaterally.  She has some leg pain.  Her chest feels better.  I personally reviewed her cath films and there is no significant change from prior.  Ultrasound was negative for any kind of DVT.  Will have her ambulate.  She may need some temporary pain medication.  From a cardiac standpoint, she is stable for discharge.  She can follow-up with her primary care doctor regarding further work-up for the leg pain.  Larae Grooms

## 2019-04-05 NOTE — Telephone Encounter (Signed)
**Note De-Identified [name] Obfuscation** Patient contacted regarding discharge from [facility] on 04/04/2019.  Patient understands to follow up with provider Richardson Dopp, PA-c on 04/17/2019 at 2:15 at [facility] in [place], [place] [address]. Patient understands discharge instructions? Yes Patient understands medications and regiment? Yes Patient understands to bring all medications to this visit? Yes

## 2019-04-08 ENCOUNTER — Other Ambulatory Visit: Payer: Self-pay

## 2019-04-08 NOTE — Patient Outreach (Signed)
[facility]) Care Management  04/08/2019  [name] 1945-02-17 [id number]   Referral received.  No outreach warranted at this time.  Transition of Care calls being completed via EMMI. RN CM will outreach patient for any red flags received.    Plan: RN CM will close case.    Jone Baseman, RN, MSN [facility] Care Management Coordinator Direct Line [phone] Cell [phone] Toll Free: [phone]  Fax: [phone]

## 2019-04-12 ENCOUNTER — Other Ambulatory Visit: Payer: Self-pay

## 2019-04-12 ENCOUNTER — Telehealth: Payer: Self-pay | Admitting: Cardiovascular Disease

## 2019-04-12 ENCOUNTER — Ambulatory Visit
Admission: RE | Admit: 2019-04-12 | Discharge: 2019-04-12 | Disposition: A | Discharge status: Home / Self Care | Payer: Medicare HMO | Source: Ambulatory Visit | Attending: Internal Medicine | Admitting: Internal Medicine

## 2019-04-12 DIAGNOSIS — Z1231 Encounter for screening mammogram for malignant neoplasm of breast: Secondary | ICD-10-CM | POA: Diagnosis not present

## 2019-04-12 NOTE — Telephone Encounter (Signed)
  Patient was returning a call because she saw our number on her phone. Searched chart to see who may have called. Let her know it was most likely a reminder call regarding her appt on 04/17/19.

## 2019-04-16 DIAGNOSIS — Z0100 Encounter for examination of eyes and vision without abnormal findings: Secondary | ICD-10-CM | POA: Diagnosis not present

## 2019-04-16 DIAGNOSIS — H524 Presbyopia: Secondary | ICD-10-CM | POA: Diagnosis not present

## 2019-04-17 ENCOUNTER — Encounter (INDEPENDENT_AMBULATORY_CARE_PROVIDER_SITE_OTHER): Payer: Self-pay

## 2019-04-17 ENCOUNTER — Encounter: Payer: Self-pay | Admitting: Physician Assistant

## 2019-04-17 ENCOUNTER — Ambulatory Visit: Payer: Medicare HMO | Admitting: Physician Assistant

## 2019-04-17 ENCOUNTER — Other Ambulatory Visit: Payer: Self-pay

## 2019-04-17 VITALS — BP 142/50 | HR 61 | Ht 65.0 in | Wt 221.8 lb

## 2019-04-17 DIAGNOSIS — I25118 Atherosclerotic heart disease of native coronary artery with other forms of angina pectoris: Secondary | ICD-10-CM | POA: Diagnosis not present

## 2019-04-17 DIAGNOSIS — E782 Mixed hyperlipidemia: Secondary | ICD-10-CM | POA: Diagnosis not present

## 2019-04-17 DIAGNOSIS — I10 Essential (primary) hypertension: Secondary | ICD-10-CM

## 2019-04-17 NOTE — Progress Notes (Signed)
Cardiology Office Note:    Date:  04/17/2019   ID:  RIO TABER, DOB [birth year], MRN [id number]  PCP:  Biagio Borg, MD  Cardiologist:  Sherren Mocha, MD   Electrophysiologist:  None   Referring MD: Biagio Borg, MD   Chief Complaint:  Hospitalization Follow-up ([facility] with [place] >> Cath;  Transitional Care Appt)    Patient Profile:    [name] is a 74 y.o. female with:   Coronary artery disease  ? s/p prior inf MI in 1998 tx with stent to the RCA ? S/p stent LCx in 2012 ? S/p stent to the LAD in 2015 ? S/p DES x 3 to the RCA 2017 ? Myoview 07/2018: low risk ? Cath 03/2019: dRCA diff dz'd supplying small/dz'd PDA+PL br (not ideal for PCI) >> med Rx  Diabetes mellitus   Hypertension  Hyperlipidemia  AAA ? S/p EVAR 01/2016  Prior CV studies: Cardiac catheterization 2019-04-17 LM normal LAD prox stent ok with 30 ISR, dist 75; D2 75 LCx prox 60, mid 60; OM2 stent ok RCA ost stent ok, mid 70, dist 55; RPDA w/ severe dz EF 55-65   Myoview 08/17/2018 Normal perfusion; EF 59; Low Risk  Echocardiogram 02/09/2018 EF 50-55, no RWMA, mod MAC  Echocardiogram 06/13/17 EF 55-60, no RWMA, Gr 1 DD, mod LAE, trivial TR, PASP 20, trivial eff  Myoview 11/2016 EF 50, breast atten, no ischemia, Low Risk  LHC 10/29/15 LM ok LAD prox stent patent with 30 ISR, dist 70, D2 70 LCx mid 60, OM2 stent patent RCA ost 60, ost-prox 50, prox-mid 70, mid 50, RPDA 40 FFR of RCA 0.71 (hemodynamically significant) PCI: 3 x 28 mm Promus, 3 x 16 mm Promus, 3 x 12 mm Promus DES to RCA   Echo 10/29/15 EF 55-60, no RWMA, Gr 1 DD, MAC  Echo 11/16 EF 55-60, Gr 1 DD  LHC 2/16 LM ok LAD prox stent ok LCx stent ok RCA prox 40 EF 55-60   Abdominal US 12/15 4 x 4 cm AAA  LHC (06/06/13):  prox LAD 90%, mid LAD stent ok with 40% ISR, small caliber Dx prox 40% mid 70% (inf sub-branch), OM1 stent ok, prox RCA 50% ,mid RCA 40%, EF 65%.  PCI: 3.0 x 22 mm Resolute DES to proximal  LAD  Echo (02/2012):  EF 55% to 60%. Wall motion was normal.   History of Present Illness:    Ms. Hannan was last seen by Dr. Burt Knack in 01/2019.  She was recently admitted 3/03-31-2018 with unstable angina.  Cardiac catheterization demonstrated 3 v coronary artery disease with patent stent in the LAD, OM2 and RCA.  There was diffuse disease in the distal RCA supplying a small PDA and PL branch.  The vessel is not ideal for PCI and med Rx was continued.  Isosorbide was added to her regimen and Ezetimibe was added as her LDL was 95 on Atorvastatin 20.  She returns for follow-up.  She is here alone.  Since discharge from the hospital, she has had 2 episodes of chest discomfort.  She did not have to take nitroglycerin.  Her symptoms were mild compared to her presenting symptoms.  She has not had significant shortness of breath, syncope, orthopnea, leg swelling.  She does have some left ankle swelling at times which is mild.  Past Medical History:  Diagnosis Date  . AAA (abdominal aortic aneurysm) ([place])    a. s/p stent graft repair 01/2016.  [name] Allergic rhinitis, cause  unspecified   . Anxiety state, unspecified   . Chronic LBP   . Coronary artery disease    a. inferior MI 1998 s/p PCI of RCA. b. stenting of Cx 04/2010. c. DES to prox LAD 05/2013. d. DES x 3 in 10/2015. // Myoview 07/2018:  EF 59, normal perfusion, low risk   . Degeneration of lumbar or lumbosacral intervertebral disc   . Depressive disorder, not elsewhere classified   . Diabetes mellitus    TYPE II  . Gout 08/22/2013  . Hyperlipidemia   . Hypertension   . Hypothyroidism   . Lower GI bleed 06/2010   Diverticular bleed  . Noncompliance with medications 02/26/2014  . Obesity, unspecified     Current Medications: Current Meds  Medication Sig  . allopurinol (ZYLOPRIM) 300 MG tablet Take 300 mg by mouth as needed (for Gout).  [name] aspirin 81 MG EC tablet Take 81 mg by mouth daily.   [name] atenolol (TENORMIN) 25 MG tablet TAKE 1 TABLET(25  MG) BY MOUTH TWICE DAILY  . atorvastatin (LIPITOR) 20 MG tablet Take 20 mg by mouth as needed (for colestoral).  . cyanocobalamin 1000 MCG tablet Take 1 tablet (1,000 mcg total) by mouth daily.  . cyclobenzaprine (FLEXERIL) 5 MG tablet Take 5 mg by mouth 3 (three) times daily as needed for muscle spasms.   [name] docusate sodium (COLACE) 100 MG capsule Take 1 capsule (100 mg total) by mouth 2 (two) times daily as needed for mild constipation.  [name] ezetimibe (ZETIA) 10 MG tablet Take 1 tablet (10 mg total) by mouth daily.  . fexofenadine (ALLEGRA) 180 MG tablet Take 180 mg by mouth as needed for allergies or rhinitis.  [name] gabapentin (NEURONTIN) 300 MG capsule Take 300 mg by mouth as needed (pain).  . hydrochlorothiazide (MICROZIDE) 12.5 MG capsule Take 1 capsule (12.5 mg total) by mouth daily.  . hydrocortisone 1 % lotion Apply 1 application topically 2 (two) times daily. Apply to affected area  . isosorbide mononitrate (IMDUR) 30 MG 24 hr tablet Take 1 tablet (30 mg total) by mouth daily.  . metFORMIN (GLUCOPHAGE-XR) 500 MG 24 hr tablet TAKE 4 TABLETS BY MOUTH EVERY MORNING. Resume on 04/06/19.  [name] nitroGLYCERIN (NITROSTAT) 0.4 MG SL tablet Place 1 tablet (0.4 mg total) under the tongue every 5 (five) minutes as needed for chest pain.  [name] oxyCODONE (OXY IR/ROXICODONE) 5 MG immediate release tablet 4 (four) times daily as needed.  . pantoprazole (PROTONIX) 40 MG tablet Take 40 mg by mouth as needed (heartburn acid reflux).   . pregabalin (LYRICA) 75 MG capsule Take 75 mg by mouth daily as needed (pain).   . SYNTHROID 150 MCG tablet TAKE 1 TABLET(150 MCG) BY MOUTH DAILY  . tiZANidine (ZANAFLEX) 2 MG tablet Take 1 tablet (2 mg total) by mouth every 6 (six) hours as needed for muscle spasms.  [name] triamcinolone (NASACORT) 55 MCG/ACT AERO nasal inhaler Place 2 sprays into the nose as needed (congestion).  . Vitamin D, Cholecalciferol, 25 MCG (1000 UT) CAPS Take 1 capsule by mouth daily.     Allergies:   Crestor  [rosuvastatin calcium], Prilosec [omeprazole], Miconazole nitrate, Augmentin [amoxicillin-pot clavulanate], and Doxycycline   Social History   Tobacco Use  . Smoking status: Former Smoker    Years: 30.00    Types: Cigarettes    Quit date: 05/31/1986    Years since quitting: 32.9  . Smokeless tobacco: Never Used  Substance Use Topics  . Alcohol use: No  . Drug use:  No     Family Hx: The patient's family history includes Arthritis in her maternal aunt; Breast cancer in her maternal aunt; Diabetes in her brother and maternal aunt; Heart attack (age of onset: 79) in her mother; Heart attack (age of onset: 39) in her father; Heart disease in her father and mother. There is no history of Colon cancer.  ROS   EKGs/Labs/Other Test Reviewed:    EKG:  EKG is  ordered today.  The ekg ordered today demonstrates normal sinus rhythm, heart rate 61, left axis deviation, right bundle branch block, QTC 440, no changes  Recent Labs: 03/18/2019: ALT 12; TSH 4.17 04/04/2019: BUN 10; Creatinine, Ser 0.92; Hemoglobin 12.5; Platelets 236; Potassium 4.0; Sodium 144   Recent Lipid Panel Lab Results  Component Value Date/Time   CHOL 134 04/04/2019 04:11 AM   CHOL 116 11/08/2018 03:46 PM   TRIG 80 04/04/2019 04:11 AM   TRIG 102 01/02/2006 08:25 AM   HDL 23 (L) 04/04/2019 04:11 AM   HDL 38 (L) 11/08/2018 03:46 PM   CHOLHDL 5.8 04/04/2019 04:11 AM   LDLCALC 95 04/04/2019 04:11 AM   LDLCALC 63 11/08/2018 03:46 PM   LDLDIRECT 131.0 03/27/2015 11:37 AM    Physical Exam:    VS:  BP (!) 142/50   Pulse 61   Ht _0  (1.651 m)   Wt 221 lb 12.8 oz (100.6 kg)   SpO2 97%   BMI 36.91 kg/m     Wt Readings from Last 3 Encounters:  04/17/19 221 lb 12.8 oz (100.6 kg)  04/04/19 221 lb 12.8 oz (100.6 kg)  03/18/19 223 lb 3.2 oz (101.2 kg)     Constitutional:      Appearance: Healthy appearance. Not in distress.  Neck:     Vascular: JVD normal.  Pulmonary:     Effort: Pulmonary effort is normal.      Breath sounds: No wheezing. No rales.  Cardiovascular:     Normal rate. Regular rhythm. Normal S1. Normal S2.     Murmurs: There is no murmur.  Edema:    Peripheral edema absent.  Abdominal:     Palpations: Abdomen is soft. There is no hepatomegaly.  Musculoskeletal:     Comments: Left wrist without hematoma Skin:    General: Skin is cool and dry.  Neurological:     General: No focal deficit present.     Mental Status: Alert and oriented to person, place and time.       ASSESSMENT & PLAN:    1. Coronary artery disease of native artery of native heart with stable angina pectoris ([place]) Prior history of stenting to the LAD, LCx and RCA.  She was recently admitted with unstable angina.  Cardiac enzymes remain normal.  Stents in the LAD, OM and RCA were patent.  She had diffuse distal RCA disease.  The RCA supplied a small PDA.  The vessel was not ideal for PCI medical therapy was recommended.  She does not think that she picked up the isosorbide.  She has also not picked up ezetimibe.  She has had a couple of episodes of angina since discharge.  I have encouraged her to go ahead and pick up the isosorbide and start it.  If she realizes that she is taking isosorbide, I will increase this to 60 mg daily.  Continue aspirin, atorvastatin.  Follow-up in 3 months.  2. Essential hypertension Blood pressure above target.  Start isosorbide as noted above.  Continue current dose of atenolol.  She takes HCTZ as needed for swelling.  Consider adding amlodipine if blood pressure remains uncontrolled.  3. Mixed hyperlipidemia Recent LDL 95.  She was to start ezetimibe in addition to atorvastatin.  I have encouraged her to pick this medication up.  Arrange fasting lipids and LFTs in 3 months.     Dispo:  Return in about 3 months (around 07/17/2019) for Routine Follow Up, w/ Richardson Dopp, PA-C, in person.   Medication Adjustments/Labs and Tests Ordered: Current medicines are reviewed at length with the  patient today.  Concerns regarding medicines are outlined above.  Tests Ordered: Orders Placed This Encounter  Procedures  . Lipid panel  . Hepatic function panel  . EKG 12-Lead   Medication Changes: No orders of the defined types were placed in this encounter.   Signed, Richardson Dopp, PA-C  04/17/2019 3:10 PM    [facility] [address], [place], [place]  [address] Phone: [phone]; Fax: [phone]

## 2019-04-17 NOTE — Patient Instructions (Addendum)
Medication Instructions:   Your physician recommends that you continue on your current medications as directed. Please refer to the Current Medication list given to you today. Make sure you pick up your Imdur and Zetia from [facility].  *If you need a refill on your cardiac medications before your next appointment, please call your pharmacy*  Lab Work:  Your physician recommends that you return for lab work in 3 months when you have your follow up visit.  Testing/Procedures:  None ordered today  Follow-Up: At [facility], you and your health needs are our priority.  As part of our continuing mission to provide you with exceptional heart care, we have created designated Provider Care Teams.  These Care Teams include your primary Cardiologist (physician) and Advanced Practice Providers (APPs -  Physician Assistants and Nurse Practitioners) who all work together to provide you with the care you need, when you need it.  We recommend signing up for the patient portal called "MyChart".  Sign up information is provided on this After Visit Summary.  MyChart is used to connect with patients for Virtual Visits (Telemedicine).  Patients are able to view lab/test results, encounter notes, upcoming appointments, etc.  Non-urgent messages can be sent to your provider as well.   To learn more about what you can do with MyChart, go to [email or web address].    Your next appointment:    On 07/17/19 at 9:15AM with Richardson Dopp, PA-C

## 2019-05-01 DIAGNOSIS — G894 Chronic pain syndrome: Secondary | ICD-10-CM | POA: Diagnosis not present

## 2019-05-01 DIAGNOSIS — Z79899 Other long term (current) drug therapy: Secondary | ICD-10-CM | POA: Diagnosis not present

## 2019-05-01 DIAGNOSIS — M4325 Fusion of spine, thoracolumbar region: Secondary | ICD-10-CM | POA: Diagnosis not present

## 2019-05-01 DIAGNOSIS — M4010 Other secondary kyphosis, site unspecified: Secondary | ICD-10-CM | POA: Diagnosis not present

## 2019-05-01 DIAGNOSIS — M7918 Myalgia, other site: Secondary | ICD-10-CM | POA: Diagnosis not present

## 2019-05-01 DIAGNOSIS — Z79891 Long term (current) use of opiate analgesic: Secondary | ICD-10-CM | POA: Diagnosis not present

## 2019-05-01 DIAGNOSIS — M545 Low back pain: Secondary | ICD-10-CM | POA: Diagnosis not present

## 2019-05-09 ENCOUNTER — Other Ambulatory Visit: Payer: Self-pay

## 2019-05-09 MED ORDER — ISOSORBIDE MONONITRATE ER 30 MG PO TB24: 30.0000 mg | ORAL_TABLET | Freq: Every day | ORAL | 3 refills | Status: DC

## 2019-05-09 MED ORDER — EZETIMIBE 10 MG PO TABS: 10.0000 mg | ORAL_TABLET | Freq: Every day | ORAL | 3 refills | Status: DC

## 2019-05-30 DIAGNOSIS — M4325 Fusion of spine, thoracolumbar region: Secondary | ICD-10-CM | POA: Diagnosis not present

## 2019-05-30 DIAGNOSIS — G894 Chronic pain syndrome: Secondary | ICD-10-CM | POA: Diagnosis not present

## 2019-05-30 DIAGNOSIS — M545 Low back pain: Secondary | ICD-10-CM | POA: Diagnosis not present

## 2019-05-30 DIAGNOSIS — M7918 Myalgia, other site: Secondary | ICD-10-CM | POA: Diagnosis not present

## 2019-06-26 ENCOUNTER — Other Ambulatory Visit: Payer: Self-pay | Admitting: Internal Medicine

## 2019-06-26 NOTE — Telephone Encounter (Signed)
Please refill as per office routine med refill policy (all routine meds refilled for 3 mo or monthly per pt preference up to one year from last visit, then month to month grace period for 3 mo, then further med refills will have to be denied)  

## 2019-06-27 DIAGNOSIS — H16223 Keratoconjunctivitis sicca, not specified as Sjogren's, bilateral: Secondary | ICD-10-CM | POA: Diagnosis not present

## 2019-07-03 DIAGNOSIS — M4325 Fusion of spine, thoracolumbar region: Secondary | ICD-10-CM | POA: Diagnosis not present

## 2019-07-03 DIAGNOSIS — M1711 Unilateral primary osteoarthritis, right knee: Secondary | ICD-10-CM | POA: Diagnosis not present

## 2019-07-03 DIAGNOSIS — M1712 Unilateral primary osteoarthritis, left knee: Secondary | ICD-10-CM | POA: Diagnosis not present

## 2019-07-03 DIAGNOSIS — M170 Bilateral primary osteoarthritis of knee: Secondary | ICD-10-CM | POA: Diagnosis not present

## 2019-07-03 DIAGNOSIS — M25569 Pain in unspecified knee: Secondary | ICD-10-CM | POA: Diagnosis not present

## 2019-07-16 NOTE — Progress Notes (Signed)
Cardiology Office Note:    Date:  07/17/2019   ID:  [name], DOB May 28, 1945, MRN [id number]  PCP:  Biagio Borg, MD  Cardiologist:  Sherren Mocha, MD  Electrophysiologist:  None   Referring MD: Biagio Borg, MD   Chief Complaint:  Follow-up (CAD)    Patient Profile:    [name] is a 74 y.o. female with:   Coronary artery disease  ? s/p prior inf MI in 1998 tx with stent to the RCA ? S/p stent LCx in 2012 ? S/p stent to the LAD in 2015 ? S/p DES x 3 to the RCA 2017 ? Myoview 07/2018: low risk ? Cath 03/2019: dRCA diff dz'd supplying small/dz'd PDA+PL br (not ideal for PCI) >> med Rx  Diabetes mellitus   Hypertension  Hyperlipidemia  AAA ? S/p EVAR 01/2016  Prior CV studies: Cardiac catheterization 04/11/19 LM normal LAD prox stent ok with 30 ISR, dist 75; D2 75 LCx prox 60, mid 60; OM2 stent ok RCA ost stent ok, mid 70, dist 55; RPDA w/ severe dz EF 55-65   Myoview 08/17/2018 Normal perfusion; EF 59; Low Risk  Echocardiogram 02/09/2018 EF 50-55, no RWMA, mod MAC  Echocardiogram 06/13/17 EF 55-60, no RWMA, Gr 1 DD, mod LAE, trivial TR, PASP 20, trivial eff  Myoview 11/2016 EF 50, breast atten, no ischemia, Low Risk  LHC 10/29/15 LM ok LAD prox stent patent with 30 ISR, dist 70, D2 70 LCx mid 60, OM2 stent patent RCA ost 60, ost-prox 50, prox-mid 70, mid 50, RPDA 40 FFR of RCA 0.71 (hemodynamically significant) PCI: 3 x 28 mm Promus, 3 x 16 mm Promus, 3 x 12 mm Promus DES to RCA   Echo 10/29/15 EF 55-60, no RWMA, Gr 1 DD, MAC  Echo 11/16 EF 55-60, Gr 1 DD  LHC 2/16 LM ok LAD prox stent ok LCx stent ok RCA prox 40 EF 55-60   LHC (06/06/13):  prox LAD 90%, mid LAD stent ok with 40% ISR, small caliber Dx prox 40% mid 70% (inf sub-branch), OM1 stent ok, prox RCA 50% ,mid RCA 40%, EF 65%.  PCI: 3.0 x 22 mm Resolute DES to proximal LAD  Echo (02/2012):  EF 55% to 60%. Wall motion was normal.  History of Present  Illness:    Ms. Smouse was last seen in clinic in 03/2019 after an admission with unstable angina.  Cardiac catheterization demonstrated 3 v coronary artery disease with patent stent in the LAD, OM2 and RCA.  There was diffuse disease in the distal RCA supplying a small PDA and PL branch.  The vessel is not ideal for PCI and med Rx was continued.  She returns for follow up.  She is here alone.  She continues to have occasional episodes of substernal chest discomfort.  She describes this as pain.  It does not seem to be brought on by exertion.  She has no associated symptoms.  She has not been short of breath.  She has not had orthopnea, lower extremity swelling or syncope.  Of note, she has not started isosorbide or ezetimibe.  She does not take atorvastatin on a consistent basis.  Past Medical History:  Diagnosis Date  . AAA (abdominal aortic aneurysm) ([place])    a. s/p stent graft repair 01/2016.  [name] Acute ischemic colitis ([place]) 07/29/2010   Diagnosed June, 2012 characterized by acute lower GI bleeding, spontaneously resolved.   . Acute respiratory infection 06/09/2014  . Acute sinus infection  05/14/2015  . Allergic rhinitis, cause unspecified   . Angioedema of lips 06/03/2014  . Anxiety state, unspecified   . Arthritis of left hip 04/16/2015  . Bilateral hearing loss 07/19/2017  . Chronic LBP   . Coronary artery disease    a. inferior MI 1998 s/p PCI of RCA. b. stenting of Cx 04/2010. c. DES to prox LAD 05/2013. d. DES x 3 in 10/2015. // Myoview 07/2018:  EF 59, normal perfusion, low risk   . Coronary artery disease involving native heart without angina pectoris 07/31/2006   Qualifier: Diagnosis of  By: [name] MD, [name]  ANGIOGRAPHIC DATA:  1. Ventriculography done in the RAO projection reveals a small wall  motion abnormality in the mid inferior wall. Ejection fraction  would be estimated at around 50%.  2. The right coronary artery has some progressive disease of about 60-  80% in the proximal mid  segment. The distal vessel appears to be  50-70% narrowing althou  . Degeneration of lumbar or lumbosacral intervertebral disc   . Depressive disorder, not elsewhere classified   . Diabetes ([place]) 11/03/2006   Qualifier: Diagnosis of  By: [name] MD, [name]   . Diabetes mellitus    TYPE II  . Frequent urination 05/08/2014  . Gout 08/22/2013  . History of endovascular stent graft for abdominal aortic aneurysm (AAA) 01/22/2016  . Hyperlipidemia   . Hypertension   . Hypothyroidism   . Iron deficiency anemia 06/13/2017  . Lower GI bleed 06/2010   Diverticular bleed  . Lumbar radiculopathy 05/15/2015  . MENOPAUSAL DISORDER 10/29/2007   Qualifier: Diagnosis of  By: [name] MD, [name]   . Myalgia 09/25/2013  . Noncompliance with medications 02/26/2014  . Obesity, unspecified   . OTITIS MEDIA, SEROUS, CHRONIC 04/23/2007   Qualifier: Diagnosis of  By: [name] MD, [name]   . Thoracic disc disease 11/19/2018  . URTICARIA 09/23/2009   Qualifier: Diagnosis of  By: [name] MD, [name] Venous insufficiency 07/19/2017    Current Medications: Current Meds  Medication Sig  . allopurinol (ZYLOPRIM) 300 MG tablet Take 300 mg by mouth as needed (for Gout).  [name] aspirin 81 MG EC tablet Take 81 mg by mouth daily.   [name] atenolol (TENORMIN) 25 MG tablet TAKE 1 TABLET(25 MG) BY MOUTH TWICE DAILY  . cyclobenzaprine (FLEXERIL) 5 MG tablet Take 5 mg by mouth 3 (three) times daily as needed for muscle spasms.   [name] docusate sodium (COLACE) 100 MG capsule Take 1 capsule (100 mg total) by mouth 2 (two) times daily as needed for mild constipation.  . fexofenadine (ALLEGRA) 180 MG tablet Take 180 mg by mouth as needed for allergies or rhinitis.  [name] gabapentin (NEURONTIN) 300 MG capsule Take 300 mg by mouth as needed (pain).  . hydrochlorothiazide (MICROZIDE) 12.5 MG capsule Take 1 capsule (12.5 mg total) by mouth daily.  . hydrocortisone 1 % lotion Apply 1 application topically 2 (two) times daily. Apply to affected area  .  metFORMIN (GLUCOPHAGE) 500 MG tablet Take 500 mg by mouth daily with breakfast.  . nitroGLYCERIN (NITROSTAT) 0.4 MG SL tablet Place 1 tablet (0.4 mg total) under the tongue every 5 (five) minutes as needed for chest pain.  [name] oxyCODONE (OXY IR/ROXICODONE) 5 MG immediate release tablet 4 (four) times daily as needed.  . pantoprazole (PROTONIX) 40 MG tablet Take 40 mg by mouth as needed (heartburn acid reflux).   . pregabalin (LYRICA) 75 MG capsule Take 75 mg  by mouth daily as needed (pain).   . SYNTHROID 150 MCG tablet TAKE 1 TABLET(150 MCG) BY MOUTH DAILY  . tiZANidine (ZANAFLEX) 2 MG tablet Take 1 tablet (2 mg total) by mouth every 6 (six) hours as needed for muscle spasms.  [name] triamcinolone (NASACORT) 55 MCG/ACT AERO nasal inhaler Place 2 sprays into the nose as needed (congestion).  . Vitamin D, Cholecalciferol, 25 MCG (1000 UT) CAPS Take 1 capsule by mouth daily.  . [DISCONTINUED] atorvastatin (LIPITOR) 20 MG tablet Take 20 mg by mouth as needed (for colestoral).  . [DISCONTINUED] ezetimibe (ZETIA) 10 MG tablet Take 1 tablet (10 mg total) by mouth daily.  . [DISCONTINUED] isosorbide mononitrate (IMDUR) 30 MG 24 hr tablet Take 1 tablet (30 mg total) by mouth daily.     Allergies:   Crestor [rosuvastatin calcium], Prilosec [omeprazole], Miconazole nitrate, Augmentin [amoxicillin-pot clavulanate], and Doxycycline   Social History   Tobacco Use  . Smoking status: Former Smoker    Years: 30.00    Types: Cigarettes    Quit date: 05/31/1986    Years since quitting: 33.1  . Smokeless tobacco: Never Used  Vaping Use  . Vaping Use: Never used  Substance Use Topics  . Alcohol use: No  . Drug use: No     Family Hx: The patient's family history includes Arthritis in her maternal aunt; Breast cancer in her maternal aunt; Diabetes in her brother and maternal aunt; Heart attack (age of onset: 97) in her mother; Heart attack (age of onset: 77) in her father; Heart disease in her father and mother.  There is no history of Colon cancer.  ROS   EKGs/Labs/Other Test Reviewed:    EKG:  EKG is not ordered today.  The ekg ordered today demonstrates n/a  Recent Labs: 03/18/2019: ALT 12; TSH 4.17 04/04/2019: BUN 10; Creatinine, Ser 0.92; Hemoglobin 12.5; Platelets 236; Potassium 4.0; Sodium 144   Recent Lipid Panel Lab Results  Component Value Date/Time   CHOL 134 04/04/2019 04:11 AM   CHOL 116 11/08/2018 03:46 PM   TRIG 80 04/04/2019 04:11 AM   TRIG 102 01/02/2006 08:25 AM   HDL 23 (L) 04/04/2019 04:11 AM   HDL 38 (L) 11/08/2018 03:46 PM   CHOLHDL 5.8 04/04/2019 04:11 AM   LDLCALC 95 04/04/2019 04:11 AM   LDLCALC 63 11/08/2018 03:46 PM   LDLDIRECT 131.0 03/27/2015 11:37 AM    Physical Exam:    VS:  BP (!) 102/58   Pulse 71   Ht _0  (1.651 m)   Wt 213 lb 3.2 oz (96.7 kg)   SpO2 96%   BMI 35.48 kg/m     Wt Readings from Last 3 Encounters:  07/17/19 213 lb 3.2 oz (96.7 kg)  04/17/19 221 lb 12.8 oz (100.6 kg)  04/04/19 221 lb 12.8 oz (100.6 kg)     Constitutional:      Appearance: Healthy appearance. Not in distress.  Pulmonary:     Effort: Pulmonary effort is normal.     Breath sounds: No wheezing. No rales.  Cardiovascular:     Normal rate. Regular rhythm. Normal S1. Normal S2.     Murmurs: There is no murmur.  Edema:    Peripheral edema absent.  Abdominal:     Palpations: Abdomen is soft.  Musculoskeletal:     Cervical back: Neck supple. Skin:    General: Skin is warm and dry.  Neurological:     Mental Status: Alert and oriented to person, place and time.  Cranial Nerves: Cranial nerves are intact.      ASSESSMENT & PLAN:    1. Coronary artery disease of native artery of native heart with stable angina pectoris ([place]) Prior history of stenting to the LAD, LCx and RCA.  Cardiac catheterization in March 2021 demonstrated diffuse distal RCA disease leading into a severely diseased small PDA.  Medical therapy has been recommended.  She has occasional  substernal chest discomfort that is not very typical for angina.  She has not taken nitroglycerin for this.  She has not tried taking isosorbide.  Her blood pressure is somewhat borderline low.  I have recommended trying to take isosorbide 15 mg daily.  If she has symptomatic hypotension with this or no significant improvement, we can certainly discontinue it.  If her blood pressure limits antianginal therapy, we could consider ranolazine.  Continue aspirin.  Start taking atorvastatin on a regular basis.  Continue beta-blocker.  Follow-up in 3 months.  2. Essential hypertension Blood pressure is well controlled.  As noted, it is on the low side may limit antianginal therapy.  Continue current dose of atenolol, hydrochlorothiazide.  Start isosorbide as outlined above.  3. Mixed hyperlipidemia She has not been taking atorvastatin on a regular basis.  She is also not taking ezetimibe.  We discussed the importance of aggressive lipid management to reduce her risk of future events.  Start atorvastatin 20 mg daily.  Obtain lipids and LFTs in 8 weeks.     Dispo:  Return in about 3 months (around 10/17/2019) for Routine Follow Up, w/ Dr. Burt Knack, or Richardson Dopp, PA-C, in person.   Medication Adjustments/Labs and Tests Ordered: Current medicines are reviewed at length with the patient today.  Concerns regarding medicines are outlined above.  Tests Ordered: No orders of the defined types were placed in this encounter.  Medication Changes: Meds ordered this encounter  Medications  . isosorbide mononitrate (IMDUR) 30 MG 24 hr tablet    Sig: Take 0.5 tablets (15 mg total) by mouth daily.    Dispense:  45 tablet    Refill:  3  . atorvastatin (LIPITOR) 20 MG tablet    Sig: Take 1 tablet (20 mg total) by mouth daily.    Dispense:  90 tablet    Refill:  3    Signed, Richardson Dopp, PA-C  07/17/2019 10:43 AM    [facility] [address], [place], [place]  [address] Phone: [phone]; Fax: [phone]

## 2019-07-17 ENCOUNTER — Encounter: Payer: Self-pay | Admitting: Physician Assistant

## 2019-07-17 ENCOUNTER — Ambulatory Visit (INDEPENDENT_AMBULATORY_CARE_PROVIDER_SITE_OTHER): Payer: Medicare HMO | Admitting: Physician Assistant

## 2019-07-17 ENCOUNTER — Other Ambulatory Visit: Payer: Self-pay

## 2019-07-17 ENCOUNTER — Other Ambulatory Visit: Payer: Self-pay | Admitting: *Deleted

## 2019-07-17 VITALS — BP 102/58 | HR 71 | Ht 65.0 in | Wt 213.2 lb

## 2019-07-17 DIAGNOSIS — I10 Essential (primary) hypertension: Secondary | ICD-10-CM | POA: Diagnosis not present

## 2019-07-17 DIAGNOSIS — I25118 Atherosclerotic heart disease of native coronary artery with other forms of angina pectoris: Secondary | ICD-10-CM | POA: Diagnosis not present

## 2019-07-17 DIAGNOSIS — E782 Mixed hyperlipidemia: Secondary | ICD-10-CM | POA: Diagnosis not present

## 2019-07-17 MED ORDER — ATORVASTATIN CALCIUM 20 MG PO TABS: 20.0000 mg | ORAL_TABLET | Freq: Every day | ORAL | 3 refills | Status: DC

## 2019-07-17 MED ORDER — ISOSORBIDE MONONITRATE ER 30 MG PO TB24: 15.0000 mg | ORAL_TABLET | Freq: Every day | ORAL | 3 refills | Status: DC

## 2019-07-17 NOTE — Patient Instructions (Signed)
Medication Instructions:  Your physician has recommended you make the following change in your medication:  1.  START  Atorvastatin 20 mg TAKING 1 TABLET  EVERY DAY 2.  START the Isosorbide taking 1/2 tablet daily.. IF YOUR BLOOD PRESSURE FALLS UNDER 91 ON THE TOP, OR IF YOU EXPERIENCE LIGHT HEADINESS, DIZZINESS, OR WEAKNESS OFTEN, THEN STOP THE ISOSORBIDE AND CALL AND LET us KNOW.   *If you need a refill on your cardiac medications before your next appointment, please call your pharmacy*   Lab Work: 09/12/19:  COME TO THE OFFICE FOR LAB WORK:  MAKE SURE YOU ARE FASTING (nothing to eat or drink after midnight the night before)  LAB OPENS AT 7:30... YOU CAN COME ANYTIME AFTER THAT:  LIPID & LFT  If you have labs (blood work) drawn today and your tests are completely normal, you will receive your results only by: [name] MyChart Message (if you have MyChart) OR . A paper copy in the mail If you have any lab test that is abnormal or we need to change your treatment, we will call you to review the results.   Testing/Procedures: None ordered   Follow-Up: At [facility], you and your health needs are our priority.  As part of our continuing mission to provide you with exceptional heart care, we have created designated Provider Care Teams.  These Care Teams include your primary Cardiologist (physician) and Advanced Practice Providers (APPs -  Physician Assistants and Nurse Practitioners) who all work together to provide you with the care you need, when you need it.  We recommend signing up for the patient portal called "MyChart".  Sign up information is provided on this After Visit Summary.  MyChart is used to connect with patients for Virtual Visits (Telemedicine).  Patients are able to view lab/test results, encounter notes, upcoming appointments, etc.  Non-urgent messages can be sent to your provider as well.   To learn more about what you can do with MyChart, go to [email or web address].     Your next appointment:   3 month(s)     10/23/2019 ARRIVE AT 10:30 TO SEE SCOTT WEAVER, PA-C  The format for your next appointment:   In Person  Provider:   You may see Sherren Mocha, MD or one of the following Advanced Practice Providers on your designated Care Team:    Richardson Dopp, PA-C  Robbie Lis, [place]    Other Instructions

## 2019-09-05 ENCOUNTER — Other Ambulatory Visit: Payer: Self-pay

## 2019-09-05 MED ORDER — SYNTHROID 150 MCG PO TABS: ORAL_TABLET | ORAL | 1 refills | Status: DC

## 2019-09-12 ENCOUNTER — Other Ambulatory Visit: Payer: Medicare HMO | Admitting: *Deleted

## 2019-09-12 ENCOUNTER — Other Ambulatory Visit: Payer: Self-pay

## 2019-09-12 DIAGNOSIS — I25118 Atherosclerotic heart disease of native coronary artery with other forms of angina pectoris: Secondary | ICD-10-CM | POA: Diagnosis not present

## 2019-09-12 DIAGNOSIS — I10 Essential (primary) hypertension: Secondary | ICD-10-CM | POA: Diagnosis not present

## 2019-09-12 DIAGNOSIS — E782 Mixed hyperlipidemia: Secondary | ICD-10-CM | POA: Diagnosis not present

## 2019-09-13 ENCOUNTER — Telehealth: Payer: Self-pay | Admitting: Physician Assistant

## 2019-09-13 DIAGNOSIS — E782 Mixed hyperlipidemia: Secondary | ICD-10-CM

## 2019-09-13 DIAGNOSIS — I25118 Atherosclerotic heart disease of native coronary artery with other forms of angina pectoris: Secondary | ICD-10-CM

## 2019-09-13 LAB — HEPATIC FUNCTION PANEL
ALT: 9 IU/L (ref 0–32)
AST: 16 IU/L (ref 0–40)
Albumin: 4 g/dL (ref 3.7–4.7)
Alkaline Phosphatase: 108 IU/L (ref 48–121)
Bilirubin Total: 0.7 mg/dL (ref 0.0–1.2)
Bilirubin, Direct: 0.17 mg/dL (ref 0.00–0.40)
Total Protein: 7.2 g/dL (ref 6.0–8.5)

## 2019-09-13 LAB — LIPID PANEL
Chol/HDL Ratio: 4.8 ratio — ABNORMAL HIGH (ref 0.0–4.4)
Cholesterol, Total: 183 mg/dL (ref 100–199)
HDL: 38 mg/dL — ABNORMAL LOW (ref >39)
LDL Chol Calc (NIH): 128 mg/dL — ABNORMAL HIGH (ref 0–99)
Triglycerides: 94 mg/dL (ref 0–149)
VLDL Cholesterol Cal: 17 mg/dL (ref 5–40)

## 2019-09-13 MED ORDER — EZETIMIBE 10 MG PO TABS: 10.0000 mg | ORAL_TABLET | Freq: Every day | ORAL | 1 refills | Status: DC

## 2019-09-13 NOTE — Telephone Encounter (Signed)
Patient returning call for lab results. 

## 2019-09-13 NOTE — Telephone Encounter (Signed)
The patient has been notified of the result and verbalized understanding.  All questions (if any) were answered. Patient will start Zetia and come for fasting labs on 12/16/19. Orders in for labs and prescription sent to pharmacy. Mady Haagensen, [place] 09/13/2019 5:06 PM

## 2019-09-19 ENCOUNTER — Ambulatory Visit (INDEPENDENT_AMBULATORY_CARE_PROVIDER_SITE_OTHER): Payer: Medicare HMO | Admitting: Internal Medicine

## 2019-09-19 ENCOUNTER — Encounter: Payer: Self-pay | Admitting: Internal Medicine

## 2019-09-19 ENCOUNTER — Other Ambulatory Visit: Payer: Self-pay

## 2019-09-19 VITALS — BP 110/78 | HR 77 | Temp 98.7°F | Ht 65.0 in | Wt 217.0 lb

## 2019-09-19 DIAGNOSIS — M1712 Unilateral primary osteoarthritis, left knee: Secondary | ICD-10-CM

## 2019-09-19 DIAGNOSIS — E782 Mixed hyperlipidemia: Secondary | ICD-10-CM | POA: Diagnosis not present

## 2019-09-19 DIAGNOSIS — I10 Essential (primary) hypertension: Secondary | ICD-10-CM | POA: Diagnosis not present

## 2019-09-19 DIAGNOSIS — E11311 Type 2 diabetes mellitus with unspecified diabetic retinopathy with macular edema: Secondary | ICD-10-CM | POA: Diagnosis not present

## 2019-09-19 DIAGNOSIS — J309 Allergic rhinitis, unspecified: Secondary | ICD-10-CM | POA: Diagnosis not present

## 2019-09-19 MED ORDER — FEXOFENADINE HCL 180 MG PO TABS: 180.0000 mg | ORAL_TABLET | ORAL | 3 refills | Status: DC | PRN

## 2019-09-19 MED ORDER — CELECOXIB 200 MG PO CAPS: 200.0000 mg | ORAL_CAPSULE | Freq: Two times a day (BID) | ORAL | 1 refills | Status: DC | PRN

## 2019-09-19 MED ORDER — CYCLOBENZAPRINE HCL 5 MG PO TABS: 5.0000 mg | ORAL_TABLET | Freq: Three times a day (TID) | ORAL | 2 refills | Status: DC | PRN

## 2019-09-19 MED ORDER — TRIAMCINOLONE ACETONIDE 55 MCG/ACT NA AERO
2.0000 | INHALATION_SPRAY | NASAL | 12 refills | Status: DC | PRN
Start: 2019-09-19 — End: 2023-03-21

## 2019-09-19 MED ORDER — METHYLPREDNISOLONE ACETATE 80 MG/ML IJ SUSP
80.0000 mg | Freq: Once | INTRAMUSCULAR | Status: AC
  Administered 2019-09-19: 80 mg via INTRAMUSCULAR

## 2019-09-19 NOTE — Assessment & Plan Note (Signed)
stable overall by history and exam, recent data reviewed with pt, and pt to continue medical treatment as before,  to f/u any worsening symptoms or concerns  

## 2019-09-19 NOTE — Patient Instructions (Addendum)
Please take all new medication as prescribed - the celebrex for pain  You had the steroid shot today - for the allergies  Please take all new medication as prescribed - the allegra and nasacort for allergies; and you should stop the sudafed  Please continue all other medications as before, and refills have been done if requested.  Please have the pharmacy call with any other refills you may need.  Please continue your efforts at being more active, low cholesterol diet, and weight control.  Please keep your appointments with your specialists as you may have planned - the lab work for the cholesterol  Your A1c was OK  Please make an Appointment to return in 6 months, or sooner if needed

## 2019-09-19 NOTE — Assessment & Plan Note (Signed)
For restart celebrex bid prn,  to f/u any worsening symptoms or concerns

## 2019-09-19 NOTE — Progress Notes (Addendum)
Subjective:    Patient ID: [name], female    DOB: 1945/04/24, 74 y.o.   MRN: [id number]  HPI  Here to f/u; overall doing ok,  Pt denies chest pain, increasing sob or doe, wheezing, orthopnea, PND, increased LE swelling, palpitations, dizziness or syncope.  Pt denies new neurological symptoms such as new headache, or facial or extremity weakness or numbness.  Pt denies polydipsia, polyuria, or low sugar episode.  Pt states overall good compliance with meds, mostly trying to follow appropriate diet, with wt overall stable,  but little exercise however due to ongoing left knee pain, walks with cane, sees ortho, had cortisone about 1 mo ago but only lasted about 1 wk.  Due for f/u this month.  Does have several wks ongoing nasal allergy symptoms with clearish congestion, itch and sneezing, without fever, pain, ST, cough, swelling or wheezing. Past Medical History:  Diagnosis Date  . AAA (abdominal aortic aneurysm) ([place])    a. s/p stent graft repair 01/2016.  [name] Acute ischemic colitis ([place]) 07/29/2010   Diagnosed June, 2012 characterized by acute lower GI bleeding, spontaneously resolved.   . Acute respiratory infection 06/09/2014  . Acute sinus infection 05/14/2015  . Allergic rhinitis, cause unspecified   . Angioedema of lips 06/03/2014  . Anxiety state, unspecified   . Arthritis of left hip 04/16/2015  . Bilateral hearing loss 07/19/2017  . Chronic LBP   . Coronary artery disease    a. inferior MI 1998 s/p PCI of RCA. b. stenting of Cx 04/2010. c. DES to prox LAD 05/2013. d. DES x 3 in 10/2015. // Myoview 07/2018:  EF 59, normal perfusion, low risk   . Coronary artery disease involving native heart without angina pectoris 07/31/2006   Qualifier: Diagnosis of  By: [name] MD, [name]  ANGIOGRAPHIC DATA:  1. Ventriculography done in the RAO projection reveals a small wall  motion abnormality in the mid inferior wall. Ejection fraction  would be estimated at around 50%.  2. The right coronary artery  has some progressive disease of about 60-  80% in the proximal mid segment. The distal vessel appears to be  50-70% narrowing althou  . Degeneration of lumbar or lumbosacral intervertebral disc   . Depressive disorder, not elsewhere classified   . Diabetes ([place]) 11/03/2006   Qualifier: Diagnosis of  By: [name] MD, [name]   . Diabetes mellitus    TYPE II  . Frequent urination 05/08/2014  . Gout 08/22/2013  . History of endovascular stent graft for abdominal aortic aneurysm (AAA) 01/22/2016  . Hyperlipidemia   . Hypertension   . Hypothyroidism   . Iron deficiency anemia 06/13/2017  . Lower GI bleed 06/2010   Diverticular bleed  . Lumbar radiculopathy 05/15/2015  . MENOPAUSAL DISORDER 10/29/2007   Qualifier: Diagnosis of  By: [name] MD, [name]   . Myalgia 09/25/2013  . Noncompliance with medications 02/26/2014  . Obesity, unspecified   . OTITIS MEDIA, SEROUS, CHRONIC 04/23/2007   Qualifier: Diagnosis of  By: [name] MD, [name]   . Thoracic disc disease 11/19/2018  . URTICARIA 09/23/2009   Qualifier: Diagnosis of  By: [name] MD, [name] Venous insufficiency 07/19/2017   Past Surgical History:  Procedure Laterality Date  . CARDIAC CATHETERIZATION     PCI OF BOTH THE CIRCUMFLEX AND LEFT ANTERIOR DESCENDING ARTERY  . CARDIAC CATHETERIZATION N/A 10/29/2015   Procedure: Coronary Stent Intervention;  Surgeon: Nelva Bush, MD;  Location: [place] CV LAB;  Service: Cardiovascular;  Laterality: N/A;  . CARDIAC CATHETERIZATION N/A 10/29/2015   Procedure: Coronary/Graft Angiography;  Surgeon: Nelva Bush, MD;  Location: [place] CV LAB;  Service: Cardiovascular;  Laterality: N/A;  . CARDIAC CATHETERIZATION N/A 10/29/2015   Procedure: Intravascular Pressure Wire/FFR Study;  Surgeon: Nelva Bush, MD;  Location: [place] [facility] LAB;  Service: Cardiovascular;  Laterality: N/A;  . CESAREAN SECTION    . ENDOVASCULAR STENT INSERTION N/A 01/22/2016   Procedure: ABDOMINAL AORTIC ENDOVASCULAR  STENT GRAFT INSERTION;  Surgeon: Rosetta Posner, MD;  Location: [place];  Service: Vascular;  Laterality: N/A;  . HEART STENT  04-2010  and  Jun 07, 2013   X 3  . LEFT HEART CATH AND CORONARY ANGIOGRAPHY N/A 04/03/2019   Procedure: LEFT HEART CATH AND CORONARY ANGIOGRAPHY;  Surgeon: Nelva Bush, MD;  Location: [place] CV LAB;  Service: Cardiovascular;  Laterality: N/A;  . LEFT HEART CATHETERIZATION WITH CORONARY ANGIOGRAM N/A 06/07/2013   Procedure: LEFT HEART CATHETERIZATION WITH CORONARY ANGIOGRAM;  Surgeon: [name], MD;  Location[facility] C[facility] LAB;  Service: Cardiovascular;  Laterality: N/A;  . LEFT HEART CATHETERIZATION WITH CORONARY ANGIOGRAM N/A 02/25/2014   Procedure: LEFT HEART CATHETERIZATION WITH CORONARY ANGIOGRAM;  Surgeon: Troy Sine, MD;  Location: [facility] C[facility] LAB;  Service: Cardiovascular;  Laterality: N/A;  . LUMBAR FUSION  01/2007   DR. Patrice Paradise...3-LEVEL WITH FIXATION  . OOPHORECTOMY     BSO? pt.unsure  . PARATHYROIDECTOMY    . SPINE SURGERY    . THYROIDECTOMY    . TOTAL ABDOMINAL HYSTERECTOMY      reports that she quit smoking about 33 years ago. Her smoking use included cigarettes. She quit after 30.00 years of use. She has never used smokeless tobacco. She reports that she does not drink alcohol and does not use drugs. family history includes Arthritis in her maternal aunt; Breast cancer in her maternal aunt; Diabetes in her brother and maternal aunt; Heart attack (age of onset: 40) in her mother; Heart attack (age of onset: 63) in her father; Heart disease in her father and mother. Allergies  Allergen Reactions  . Crestor [Rosuvastatin Calcium] Other (See Comments)    Feeling poor  . Prilosec [Omeprazole] Other (See Comments)    Chest pain  . Miconazole Nitrate Hives    REACTION: hives  . Augmentin [Amoxicillin-Pot Clavulanate] Hives, Itching and Rash    Has patient had a PCN reaction causing immediate rash, facial/tongue/throat swelling, SOB or  lightheadedness with hypotension:unsure Has patient had a PCN reaction causing severe rash involving mucus membranes or skin necrosis:unsure Has patient had a PCN reaction that required hospitalization:No Has patient had a PCN reaction occurring within the last 10 years:NO If all of the above answers are "NO", then may proceed with Cephalosporin use. Has patient had a PCN reaction causing immediate rash, facial/tongue/throat swelling, SOB or lightheadedness with hypotension:unsure Has patient had a PCN reaction causing severe rash involving mucus membranes or skin necrosis:unsure Has patient had a PCN reaction that required hospitalization:No Has patient had a PCN reaction occurring within the last 10 years:NO If all of the above answers are "NO", then may proceed with Cephalosporin use.   [name] Doxycycline Other (See Comments)    REACTION: gi upset   Current Outpatient Medications on File Prior to Visit  Medication Sig Dispense Refill  . allopurinol (ZYLOPRIM) 300 MG tablet Take 300 mg by mouth as needed (for Gout).    [name] aspirin 81 MG EC tablet Take 81 mg by  mouth daily.     [name] atenolol (TENORMIN) 25 MG tablet TAKE 1 TABLET(25 MG) BY MOUTH TWICE DAILY 180 tablet 1  . atorvastatin (LIPITOR) 20 MG tablet Take 1 tablet (20 mg total) by mouth daily. 90 tablet 3  . docusate sodium (COLACE) 100 MG capsule Take 1 capsule (100 mg total) by mouth 2 (two) times daily as needed for mild constipation. 30 capsule 1  . ezetimibe (ZETIA) 10 MG tablet Take 1 tablet (10 mg total) by mouth daily. 90 tablet 1  . gabapentin (NEURONTIN) 300 MG capsule Take 300 mg by mouth as needed (pain).    . hydrochlorothiazide (MICROZIDE) 12.5 MG capsule Take 1 capsule (12.5 mg total) by mouth daily. 90 capsule 3  . hydrocortisone 1 % lotion Apply 1 application topically 2 (two) times daily. Apply to affected area 96 g 0  . isosorbide mononitrate (IMDUR) 30 MG 24 hr tablet Take 0.5 tablets (15 mg total) by mouth daily. 45 tablet  3  . nitroGLYCERIN (NITROSTAT) 0.4 MG SL tablet Place 1 tablet (0.4 mg total) under the tongue every 5 (five) minutes as needed for chest pain. 25 tablet 3  . oxyCODONE (OXY IR/ROXICODONE) 5 MG immediate release tablet 4 (four) times daily as needed.    . pantoprazole (PROTONIX) 40 MG tablet Take 40 mg by mouth as needed (heartburn acid reflux).     . pregabalin (LYRICA) 75 MG capsule Take 75 mg by mouth daily as needed (pain).     . SYNTHROID 150 MCG tablet TAKE 1 TABLET(150 MCG) BY MOUTH DAILY 90 tablet 1  . tiZANidine (ZANAFLEX) 2 MG tablet Take 1 tablet (2 mg total) by mouth every 6 (six) hours as needed for muscle spasms. 40 tablet 1  . metFORMIN (GLUCOPHAGE-XR) 500 MG 24 hr tablet      No current facility-administered medications on file prior to visit.   Review of Systems All otherwise neg per pt    Objective:   Physical Exam BP 110/78 (BP Location: Left Arm, Patient Position: Sitting, Cuff Size: Large)   Pulse 77   Temp 98.7 F (37.1 C) (Oral)   Ht 5\' 5"  (1.651 m)   Wt 217 lb (98.4 kg)   SpO2 97%   BMI 36.11 kg/m  VS noted,  Constitutional: Pt appears in NAD HENT: Head: NCAT.  Right Ear: External ear normal.  Left Ear: External ear normal.  Eyes: . Pupils are equal, round, and reactive to light. Conjunctivae and EOM are normal Nose: without d/c or deformity Neck: Neck supple. Gross normal ROM Cardiovascular: Normal rate and regular rhythm.   Pulmonary/Chest: Effort normal and breath sounds without rales or wheezing.  Left knee with severe djd changes Neurological: Pt is alert. At baseline orientation, motor grossly intact Skin: Skin is warm. No rashes, other new lesions, no LE edema Psychiatric: Pt behavior is normal without agitation  All otherwise neg per pt Lab Results  Component Value Date   WBC 7.3 04/04/2019   HGB 12.5 04/04/2019   HCT 39.9 04/04/2019   PLT 236 04/04/2019   GLUCOSE 124 (H) 04/04/2019   CHOL 183 09/12/2019   TRIG 94 09/12/2019   HDL 38  (L) 09/12/2019   LDLDIRECT 131.0 03/27/2015   LDLCALC 128 (H) 09/12/2019   ALT 9 09/12/2019   AST 16 09/12/2019   NA 144 04/04/2019   K 4.0 04/04/2019   CL 108 04/04/2019   CREATININE 0.92 04/04/2019   BUN 10 04/04/2019   CO2 26 04/04/2019   TSH  4.17 03/18/2019   INR 1.08 01/22/2016   HGBA1C 6.6 (H) 03/18/2019   MICROALBUR 2.0 (H) 03/18/2019   POCT glycosylated hemoglobin (Hb A1C) Order: [id number] Status:  Final result Visible to patient:  No (inaccessible in MyChart) Dx:  Type 2 diabetes mellitus with retinop...  0 Result Notes   1 HM Topic  Ref Range & Units 1 d ago  (09/19/19) 6 mo ago  (03/18/19) 1 yr ago  (09/12/18) 1 yr ago  (01/30/18) 2 yr ago  (09/06/17)  Hemoglobin A1C 4.0 - 5.6 % 5.9Abnormal  6.6High R, CM  7.0High R, CM  6.3Abnormal  6.3 R,              Assessment & Plan:

## 2019-09-19 NOTE — Assessment & Plan Note (Addendum)
Mild to mod, for depomedrol im, allegra prn, nasacort asd,,  to f/u any worsening symptoms   I spent 31 minutes in addition to time for CPX wellness examination in preparing to see the patient by review of recent labs, imaging and procedures, obtaining and reviewing separately obtained history, communicating with the patient and family or caregiver, ordering medications, tests or procedures, and documenting clinical information in the EHR including the differential Dx, treatment, and any further evaluation and other management of allergies, dm, htn, hld, left knee djd

## 2019-09-20 LAB — POCT GLYCOSYLATED HEMOGLOBIN (HGB A1C): Hemoglobin A1C: 5.9 % — AB (ref 4.0–5.6)

## 2019-09-20 NOTE — Addendum Note (Signed)
Addended by: Marijean Heath R on: 09/20/2019 08:37 AM   Modules accepted: Orders

## 2019-10-22 NOTE — Progress Notes (Deleted)
Cardiology Office Note:    Date:  10/22/2019   ID:  [name], DOB 1946/01/04, MRN [id number]  PCP:  Biagio Borg, MD  [facility] Cardiologist:  Sherren Mocha, MD *** [facility] Electrophysiologist:  None   Referring MD: Biagio Borg, MD   Chief Complaint:  No chief complaint on file.    Patient Profile:    [name] is a 74 y.o. female with:   Coronary artery disease  ? s/p prior inf MI in 1998 tx with stent to the RCA ? S/p stent LCx in 2012 ? S/p stent to the LAD in 2015 ? S/p DES x 3 to the RCA 2017 ? Myoview 07/2018: low risk ? Cath 03/2019: dRCA diff dz'd supplying small/dz'd PDA+PL br (not ideal for PCI) >> med Rx  Diabetes mellitus   Hypertension  Hyperlipidemia  AAA ? S/p EVAR 01/2016  Prior CV studies: Cardiac catheterization 2019/04/27 LM normal LAD prox stent ok with 30 ISR, dist 75; D2 75 LCx prox 60, mid 60; OM2 stent ok RCA ost stent ok, mid 70, dist 55; RPDA w/ severe dz EF 55-65  Myoview 08/17/2018 Normal perfusion; EF 59; Low Risk  Echocardiogram 02/09/2018 EF 50-55, no RWMA, mod MAC  Echocardiogram 06/13/17 EF 55-60, no RWMA, Gr 1 DD, mod LAE, trivial TR, PASP 20, trivial eff  Myoview 11/2016 EF 50, breast atten, no ischemia, Low Risk  LHC 10/29/15 LM ok LAD prox stent patent with 30 ISR, dist 70, D2 70 LCx mid 60, OM2 stent patent RCA ost 60, ost-prox 50, prox-mid 70, mid 50, RPDA 40 FFR of RCA 0.71 (hemodynamically significant) PCI: 3 x 28 mm Promus, 3 x 16 mm Promus, 3 x 12 mm Promus DES to RCA   Echo 10/29/15 EF 55-60, no RWMA, Gr 1 DD, MAC  Echo 11/16 EF 55-60, Gr 1 DD  LHC 2/16 LM ok LAD prox stent ok LCx stent ok RCA prox 40 EF 55-60   LHC (06/06/13):  prox LAD 90%, mid LAD stent ok with 40% ISR, small caliber Dx prox 40% mid 70% (inf sub-branch), OM1 stent ok, prox RCA 50% ,mid RCA 40%, EF 65%.  PCI: 3.0 x 22 mm Resolute DES to proximal LAD  Echo (02/2012):  EF 55% to 60%. Wall motion  was normal.   History of Present Illness:    [name] was admitted in 3/21 with unstable angina.  Cardiac catheterization demonstrated patent stents in the LAD, OM2, RCA and diffuse disease in the distal RCA supplying a small PDA and PL branch.  Medical Rx was recommended.  She was last seen in 6/21.  She noted occasional symptoms of chest pain.  I started her on low dose (15 mg) of Isosorbide. If her BP limits her ability to tolerate nitrates, we can try Ranolazine.  She returns for follow up.  ***      Past Medical History:  Diagnosis Date   AAA (abdominal aortic aneurysm) ([place])    a. s/p stent graft repair 01/2016.   Acute ischemic colitis ([place]) 07/29/2010   Diagnosed June, 2012 characterized by acute lower GI bleeding, spontaneously resolved.    Acute respiratory infection 06/09/2014   Acute sinus infection 05/14/2015   Allergic rhinitis, cause unspecified    Angioedema of lips 06/03/2014   Anxiety state, unspecified    Arthritis of left hip 04/16/2015   Bilateral hearing loss 07/19/2017   Chronic LBP    Coronary artery disease    a. inferior MI  1998 s/p PCI of RCA. b. stenting of Cx 04/2010. c. DES to prox LAD 05/2013. d. DES x 3 in 10/2015. // Myoview 07/2018:  EF 59, normal perfusion, low risk    Coronary artery disease involving native heart without angina pectoris 07/31/2006   Qualifier: Diagnosis of  By: [name] MD, [name]  ANGIOGRAPHIC DATA:  1. Ventriculography done in the RAO projection reveals a small wall  motion abnormality in the mid inferior wall. Ejection fraction  would be estimated at around 50%.  2. The right coronary artery has some progressive disease of about 60-  80% in the proximal mid segment. The distal vessel appears to be  50-70% narrowing althou   Degeneration of lumbar or lumbosacral intervertebral disc    Depressive disorder, not elsewhere classified    Diabetes ([place]) 11/03/2006   Qualifier: Diagnosis of  By: [name] MD, [name]    Diabetes mellitus     TYPE II   Frequent urination 05/08/2014   Gout 08/22/2013   History of endovascular stent graft for abdominal aortic aneurysm (AAA) 01/22/2016   Hyperlipidemia    Hypertension    Hypothyroidism    Iron deficiency anemia 06/13/2017   Lower GI bleed 06/2010   Diverticular bleed   Lumbar radiculopathy 05/15/2015   MENOPAUSAL DISORDER 10/29/2007   Qualifier: Diagnosis of  By: [name] MD, [name]    Myalgia 09/25/2013   Noncompliance with medications 02/26/2014   Obesity, unspecified    OTITIS MEDIA, SEROUS, CHRONIC 04/23/2007   Qualifier: Diagnosis of  By: [name] MD, [name]    Thoracic disc disease 11/19/2018   URTICARIA 09/23/2009   Qualifier: Diagnosis of  By: [name] MD, Mateo Flow A    Venous insufficiency 07/19/2017    Current Medications: No outpatient medications have been marked as taking for the 10/23/19 encounter (Appointment) with Richardson Dopp T, PA-C.     Allergies:   Crestor [rosuvastatin calcium], Prilosec [omeprazole], Miconazole nitrate, Augmentin [amoxicillin-pot clavulanate], and Doxycycline   Social History   Tobacco Use   Smoking status: Former Smoker    Years: 30.00    Types: Cigarettes    Quit date: 05/31/1986    Years since quitting: 33.4   Smokeless tobacco: Never Used  Vaping Use   Vaping Use: Never used  Substance Use Topics   Alcohol use: No   Drug use: No     Family Hx: The patient's family history includes Arthritis in her maternal aunt; Breast cancer in her maternal aunt; Diabetes in her brother and maternal aunt; Heart attack (age of onset: 36) in her mother; Heart attack (age of onset: 19) in her father; Heart disease in her father and mother. There is no history of Colon cancer.  ROS   EKGs/Labs/Other Test Reviewed:    EKG:  EKG is *** ordered today.  The ekg ordered today demonstrates ***  Recent Labs: 03/18/2019: TSH 4.17 04/04/2019: BUN 10; Creatinine, Ser 0.92; Hemoglobin 12.5; Platelets 236; Potassium 4.0; Sodium  144 09/12/2019: ALT 9   Recent Lipid Panel Lab Results  Component Value Date/Time   CHOL 183 09/12/2019 02:54 PM   TRIG 94 09/12/2019 02:54 PM   TRIG 102 01/02/2006 08:25 AM   HDL 38 (L) 09/12/2019 02:54 PM   CHOLHDL 4.8 (H) 09/12/2019 02:54 PM   CHOLHDL 5.8 04/04/2019 04:11 AM   LDLCALC 128 (H) 09/12/2019 02:54 PM   LDLDIRECT 131.0 03/27/2015 11:37 AM    Physical Exam:    VS:  There were no vitals taken for this  visit.    Wt Readings from Last 3 Encounters:  09/19/19 217 lb (98.4 kg)  07/17/19 213 lb 3.2 oz (96.7 kg)  04/17/19 221 lb 12.8 oz (100.6 kg)     Physical Exam ***  ASSESSMENT & PLAN:    *** 1. Coronary artery disease of native artery of native heart with stable angina pectoris ([place]) Prior history of stenting to the LAD, LCx and RCA.  Cardiac catheterization in March 2021 demonstrated diffuse distal RCA disease leading into a severely diseased small PDA.  Medical therapy has been recommended.  She has occasional substernal chest discomfort that is not very typical for angina.  She has not taken nitroglycerin for this.  She has not tried taking isosorbide.  Her blood pressure is somewhat borderline low.  I have recommended trying to take isosorbide 15 mg daily.  If she has symptomatic hypotension with this or no significant improvement, we can certainly discontinue it.  If her blood pressure limits antianginal therapy, we could consider ranolazine.  Continue aspirin.  Start taking atorvastatin on a regular basis.  Continue beta-blocker.  Follow-up in 3 months.  2. Essential hypertension Blood pressure is well controlled.  As noted, it is on the low side may limit antianginal therapy.  Continue current dose of atenolol, hydrochlorothiazide.  Start isosorbide as outlined above.  3. Mixed hyperlipidemia She has not been taking atorvastatin on a regular basis.  She is also not taking ezetimibe.  We discussed the importance of aggressive lipid management to reduce her risk  of future events.  Start atorvastatin 20 mg daily.  Obtain lipids and LFTs in 8 weeks.   Dispo:  No follow-ups on file.   Medication Adjustments/Labs and Tests Ordered: Current medicines are reviewed at length with the patient today.  Concerns regarding medicines are outlined above.  Tests Ordered: No orders of the defined types were placed in this encounter.  Medication Changes: No orders of the defined types were placed in this encounter.   Signed, Richardson Dopp, PA-C  10/22/2019 8:38 PM    [facility] [place], [place], [place]  [address] Phone: [phone]; Fax: [phone]

## 2019-10-23 ENCOUNTER — Ambulatory Visit: Payer: Medicare HMO | Admitting: Physician Assistant

## 2019-10-24 ENCOUNTER — Telehealth (INDEPENDENT_AMBULATORY_CARE_PROVIDER_SITE_OTHER): Payer: Medicare HMO | Admitting: Internal Medicine

## 2019-10-24 DIAGNOSIS — R059 Cough, unspecified: Secondary | ICD-10-CM

## 2019-10-24 DIAGNOSIS — M109 Gout, unspecified: Secondary | ICD-10-CM

## 2019-10-24 DIAGNOSIS — E11311 Type 2 diabetes mellitus with unspecified diabetic retinopathy with macular edema: Secondary | ICD-10-CM

## 2019-10-24 MED ORDER — AZITHROMYCIN 250 MG PO TABS: ORAL_TABLET | ORAL | 1 refills | Status: DC

## 2019-10-24 MED ORDER — PREDNISONE 10 MG PO TABS: ORAL_TABLET | ORAL | 0 refills | Status: DC

## 2019-10-24 NOTE — Progress Notes (Deleted)
   Subjective:    Patient ID: [name], female    DOB: 1945-04-15, 74 y.o.   MRN: [id number]  HPI      S/p covid vacc x 2 -   Review of Systems     Objective:   Physical Exam    Lab Results  Component Value Date   WBC 7.3 04/04/2019   HGB 12.5 04/04/2019   HCT 39.9 04/04/2019   PLT 236 04/04/2019   GLUCOSE 124 (H) 04/04/2019   CHOL 183 09/12/2019   TRIG 94 09/12/2019   HDL 38 (L) 09/12/2019   LDLDIRECT 131.0 03/27/2015   LDLCALC 128 (H) 09/12/2019   ALT 9 09/12/2019   AST 16 09/12/2019   NA 144 04/04/2019   K 4.0 04/04/2019   CL 108 04/04/2019   CREATININE 0.92 04/04/2019   BUN 10 04/04/2019   CO2 26 04/04/2019   TSH 4.17 03/18/2019   INR 1.08 01/22/2016   HGBA1C 5.9 (A) 09/19/2019   MICROALBUR 2.0 (H) 03/18/2019       Assessment & Plan:

## 2019-10-26 ENCOUNTER — Encounter: Payer: Self-pay | Admitting: Internal Medicine

## 2019-10-26 DIAGNOSIS — R059 Cough, unspecified: Secondary | ICD-10-CM | POA: Insufficient documentation

## 2019-10-26 NOTE — Assessment & Plan Note (Signed)
stable overall by history and exam, recent data reviewed with pt, and pt to continue medical treatment as before,  to f/u any worsening symptoms or concerns  

## 2019-10-26 NOTE — Assessment & Plan Note (Addendum)
C/w bronchitis vs pna, declines cxr, Mild to mod, for antibx course,  to f/u any worsening symptoms or concerns  I spent 31 minutes in preparing to see the patient by review of recent labs, imaging and procedures, obtaining and reviewing separately obtained history, communicating with the patient and family or caregiver, ordering medications, tests or procedures, and documenting clinical information in the EHR including the differential Dx, treatment, and any further evaluation and other management of cough, dm, acute gout

## 2019-10-26 NOTE — Progress Notes (Signed)
Patient ID: [name], female   DOB: 05/08/1945, 74 y.o.   MRN: [id number]  Virtual Visit via Video Note  I connected with [name] on Oct 24, 2019 at  2:40 PM EDT by a video enabled telemedicine application and verified that I am speaking with the correct person using two identifiers.  Location of all participants today Patient: at home Provider: at office   I discussed the limitations of evaluation and management by telemedicine and the availability of in person appointments. The patient expressed understanding and agreed to proceed.  History of Present Illness: Here with acute onset mild to mod 2-3 days ST, HA, general weakness and malaise, with prod cough greenish sputum, but Pt denies chest pain, increased sob or doe, wheezing, orthopnea, PND, increased LE swelling, palpitations, dizziness or syncope, except for midl wheezing starting yesterday.  Also with recent gout flare to foot now improving but still hurts for the past wk.  S/p covid vacc.  Pt denies new neurological symptoms such as new headache, or facial or extremity weakness or numbness   Pt denies polydipsia, polyuria.   Past Medical History:  Diagnosis Date  . AAA (abdominal aortic aneurysm) ([place])    a. s/p stent graft repair 01/2016.  [name] Acute ischemic colitis ([place]) 07/29/2010   Diagnosed June, 2012 characterized by acute lower GI bleeding, spontaneously resolved.   . Acute respiratory infection 06/09/2014  . Acute sinus infection 05/14/2015  . Allergic rhinitis, cause unspecified   . Angioedema of lips 06/03/2014  . Anxiety state, unspecified   . Arthritis of left hip 04/16/2015  . Bilateral hearing loss 07/19/2017  . Chronic LBP   . Coronary artery disease    a. inferior MI 1998 s/p PCI of RCA. b. stenting of Cx 04/2010. c. DES to prox LAD 05/2013. d. DES x 3 in 10/2015. // Myoview 07/2018:  EF 59, normal perfusion, low risk   . Coronary artery disease involving native heart without angina pectoris 07/31/2006   Qualifier:  Diagnosis of  By: [name] MD, [name]  ANGIOGRAPHIC DATA:  1. Ventriculography done in the RAO projection reveals a small wall  motion abnormality in the mid inferior wall. Ejection fraction  would be estimated at around 50%.  2. The right coronary artery has some progressive disease of about 60-  80% in the proximal mid segment. The distal vessel appears to be  50-70% narrowing althou  . Degeneration of lumbar or lumbosacral intervertebral disc   . Depressive disorder, not elsewhere classified   . Diabetes ([place]) 11/03/2006   Qualifier: Diagnosis of  By: [name] MD, [name]   . Diabetes mellitus    TYPE II  . Frequent urination 05/08/2014  . Gout 08/22/2013  . History of endovascular stent graft for abdominal aortic aneurysm (AAA) 01/22/2016  . Hyperlipidemia   . Hypertension   . Hypothyroidism   . Iron deficiency anemia 06/13/2017  . Lower GI bleed 06/2010   Diverticular bleed  . Lumbar radiculopathy 05/15/2015  . MENOPAUSAL DISORDER 10/29/2007   Qualifier: Diagnosis of  By: [name] MD, [name]   . Myalgia 09/25/2013  . Noncompliance with medications 02/26/2014  . Obesity, unspecified   . OTITIS MEDIA, SEROUS, CHRONIC 04/23/2007   Qualifier: Diagnosis of  By: [name] MD, [name]   . Thoracic disc disease 11/19/2018  . URTICARIA 09/23/2009   Qualifier: Diagnosis of  By: [name] MD, [name] Venous insufficiency 07/19/2017   Past Surgical History:  Procedure Laterality Date  .  CARDIAC CATHETERIZATION     PCI OF BOTH THE CIRCUMFLEX AND LEFT ANTERIOR DESCENDING ARTERY  . CARDIAC CATHETERIZATION N/A 10/29/2015   Procedure: Coronary Stent Intervention;  Surgeon: Nelva Bush, MD;  Location: [place] [facility];  Service: Cardiovascular;  Laterality: N/A;  . CARDIAC CATHETERIZATION N/A 10/29/2015   Procedure: Coronary/Graft Angiography;  Surgeon: Nelva Bush, MD;  Location: [place] [facility];  Service: Cardiovascular;  Laterality: N/A;  . CARDIAC CATHETERIZATION N/A 10/29/2015   Procedure:  Intravascular Pressure Wire/FFR Study;  Surgeon: Nelva Bush, MD;  Location: [place] [facility];  Service: Cardiovascular;  Laterality: N/A;  . CESAREAN SECTION    . ENDOVASCULAR STENT INSERTION N/A 01/22/2016   Procedure: ABDOMINAL AORTIC ENDOVASCULAR STENT GRAFT INSERTION;  Surgeon: Rosetta Posner, MD;  Location: [facility];  Service: Vascular;  Laterality: N/A;  . HEART STENT  04-2010  and  Jun 07, 2013   X 3  . LEFT HEART CATH AND CORONARY ANGIOGRAPHY N/A 04/03/2019   Procedure: LEFT HEART CATH AND CORONARY ANGIOGRAPHY;  Surgeon: Nelva Bush, MD;  Location: [place] [facility];  Service: Cardiovascular;  Laterality: N/A;  . LEFT HEART CATHETERIZATION WITH CORONARY ANGIOGRAM N/A 06/07/2013   Procedure: LEFT HEART CATHETERIZATION WITH CORONARY ANGIOGRAM;  Surgeon: [name], MD;  Location: [facility];  Service: Cardiovascular;  Laterality: N/A;  . LEFT HEART CATHETERIZATION WITH CORONARY ANGIOGRAM N/A 02/25/2014   Procedure: LEFT HEART CATHETERIZATION WITH CORONARY ANGIOGRAM;  Surgeon: Troy Sine, MD;  Location: [facility] C[facility];  Service: Cardiovascular;  Laterality: N/A;  . LUMBAR FUSION  01/2007   DR. Patrice Paradise...3-LEVEL WITH FIXATION  . OOPHORECTOMY     BSO? pt.unsure  . PARATHYROIDECTOMY    . SPINE SURGERY    . THYROIDECTOMY    . TOTAL ABDOMINAL HYSTERECTOMY      reports that she quit smoking about 33 years ago. Her smoking use included cigarettes. She quit after 30.00 years of use. She has never used smokeless tobacco. She reports that she does not drink alcohol and does not use drugs. family history includes Arthritis in her maternal aunt; Breast cancer in her maternal aunt; Diabetes in her brother and maternal aunt; Heart attack (age of onset: 58) in her mother; Heart attack (age of onset: 107) in her father; Heart disease in her father and mother. Allergies  Allergen Reactions  . Crestor [Rosuvastatin Calcium] Other (See Comments)    Feeling poor  . Prilosec [Omeprazole] Other  (See Comments)    Chest pain  . Miconazole Nitrate Hives    REACTION: hives  . Augmentin [Amoxicillin-Pot Clavulanate] Hives, Itching and Rash    Has patient had a PCN reaction causing immediate rash, facial/tongue/throat swelling, SOB or lightheadedness with hypotension:unsure Has patient had a PCN reaction causing severe rash involving mucus membranes or skin necrosis:unsure Has patient had a PCN reaction that required hospitalization:No Has patient had a PCN reaction occurring within the last 10 years:NO If all of the above answers are "NO", then may proceed with Cephalosporin use. Has patient had a PCN reaction causing immediate rash, facial/tongue/throat swelling, SOB or lightheadedness with hypotension:unsure Has patient had a PCN reaction causing severe rash involving mucus membranes or skin necrosis:unsure Has patient had a PCN reaction that required hospitalization:No Has patient had a PCN reaction occurring within the last 10 years:NO If all of the above answers are "NO", then may proceed with Cephalosporin use.   [name] Doxycycline Other (See Comments)    REACTION: gi upset   Current Outpatient  Medications on File Prior to Visit  Medication Sig Dispense Refill  . allopurinol (ZYLOPRIM) 300 MG tablet Take 300 mg by mouth as needed (for Gout).    [name] aspirin 81 MG EC tablet Take 81 mg by mouth daily.     [name] atenolol (TENORMIN) 25 MG tablet TAKE 1 TABLET(25 MG) BY MOUTH TWICE DAILY 180 tablet 1  . atorvastatin (LIPITOR) 20 MG tablet Take 1 tablet (20 mg total) by mouth daily. 90 tablet 3  . celecoxib (CELEBREX) 200 MG capsule Take 1 capsule (200 mg total) by mouth 2 (two) times daily as needed for moderate pain. 180 capsule 1  . cyclobenzaprine (FLEXERIL) 5 MG tablet Take 1 tablet (5 mg total) by mouth 3 (three) times daily as needed for muscle spasms. 30 tablet 2  . docusate sodium (COLACE) 100 MG capsule Take 1 capsule (100 mg total) by mouth 2 (two) times daily as needed for mild  constipation. 30 capsule 1  . ezetimibe (ZETIA) 10 MG tablet Take 1 tablet (10 mg total) by mouth daily. 90 tablet 1  . fexofenadine (ALLEGRA) 180 MG tablet Take 1 tablet (180 mg total) by mouth as needed for allergies or rhinitis. 90 tablet 3  . gabapentin (NEURONTIN) 300 MG capsule Take 300 mg by mouth as needed (pain).    . hydrochlorothiazide (MICROZIDE) 12.5 MG capsule Take 1 capsule (12.5 mg total) by mouth daily. 90 capsule 3  . hydrocortisone 1 % lotion Apply 1 application topically 2 (two) times daily. Apply to affected area 96 g 0  . isosorbide mononitrate (IMDUR) 30 MG 24 hr tablet Take 0.5 tablets (15 mg total) by mouth daily. 45 tablet 3  . metFORMIN (GLUCOPHAGE-XR) 500 MG 24 hr tablet     . nitroGLYCERIN (NITROSTAT) 0.4 MG SL tablet Place 1 tablet (0.4 mg total) under the tongue every 5 (five) minutes as needed for chest pain. 25 tablet 3  . oxyCODONE (OXY IR/ROXICODONE) 5 MG immediate release tablet 4 (four) times daily as needed.    . pantoprazole (PROTONIX) 40 MG tablet Take 40 mg by mouth as needed (heartburn acid reflux).     . pregabalin (LYRICA) 75 MG capsule Take 75 mg by mouth daily as needed (pain).     . SYNTHROID 150 MCG tablet TAKE 1 TABLET(150 MCG) BY MOUTH DAILY 90 tablet 1  . tiZANidine (ZANAFLEX) 2 MG tablet Take 1 tablet (2 mg total) by mouth every 6 (six) hours as needed for muscle spasms. 40 tablet 1  . triamcinolone (NASACORT) 55 MCG/ACT AERO nasal inhaler Place 2 sprays into the nose as needed (congestion). 1 each 12   No current facility-administered medications on file prior to visit.    Observations/Objective: Alert, NAD, appropriate mood and affect, resps normal, cn 2-12 intact, moves all 4s, no visible rash or swelling Lab Results  Component Value Date   WBC 7.3 04/04/2019   HGB 12.5 04/04/2019   HCT 39.9 04/04/2019   PLT 236 04/04/2019   GLUCOSE 124 (H) 04/04/2019   CHOL 183 09/12/2019   TRIG 94 09/12/2019   HDL 38 (L) 09/12/2019   LDLDIRECT  131.0 03/27/2015   LDLCALC 128 (H) 09/12/2019   ALT 9 09/12/2019   AST 16 09/12/2019   NA 144 04/04/2019   K 4.0 04/04/2019   CL 108 04/04/2019   CREATININE 0.92 04/04/2019   BUN 10 04/04/2019   CO2 26 04/04/2019   TSH 4.17 03/18/2019   INR 1.08 01/22/2016   HGBA1C 5.9 (A) 09/19/2019  MICROALBUR 2.0 (H) 03/18/2019   Assessment and Plan: See notes  Follow Up Instructions: See notes   I discussed the assessment and treatment plan with the patient. The patient was provided an opportunity to ask questions and all were answered. The patient agreed with the plan and demonstrated an understanding of the instructions.   The patient was advised to call back or seek an in-person evaluation if the symptoms worsen or if the condition fails to improve as anticipated  [name], MD

## 2019-10-26 NOTE — Assessment & Plan Note (Signed)
For prednisone asd,  to f/u any worsening symptoms or concerns

## 2019-10-26 NOTE — Patient Instructions (Signed)
Please take all new medication as prescribed 

## 2019-10-28 ENCOUNTER — Ambulatory Visit: Payer: Medicare HMO | Admitting: Physician Assistant

## 2019-10-28 NOTE — Progress Notes (Deleted)
Cardiology Office Note:    Date:  10/28/2019   ID:  DEEM MARMOL, DOB [birth year], MRN [id number]  PCP:  Biagio Borg, MD  [facility] Cardiologist:  Sherren Mocha, MD *** [facility] Electrophysiologist:  None   Referring MD: Biagio Borg, MD   Chief Complaint:  No chief complaint on file.    Patient Profile:    [name] is a 74 y.o. female with:   Coronary artery disease  ? s/p prior inf MI in 1998 tx with stent to the RCA ? S/p stent LCx in 2012 ? S/p stent to the LAD in 2015 ? S/p DES x 3 to the RCA 2017 ? Myoview 07/2018: low risk ? Cath 03/2019: dRCA diff dz'd supplying small/dz'd PDA+PL br (not ideal for PCI) >> med Rx  Diabetes mellitus   Hypertension  Hyperlipidemia  AAA ? S/p EVAR 01/2016  Prior CV studies: Cardiac catheterization 04/14/19 LM normal LAD prox stent ok with 30 ISR, dist 75; D2 75 LCx prox 60, mid 60; OM2 stent ok RCA ost stent ok, mid 70, dist 55; RPDA w/ severe dz EF 55-65  Myoview 08/17/2018 Normal perfusion; EF 59; Low Risk  Echocardiogram 02/09/2018 EF 50-55, no RWMA, mod MAC  Echocardiogram 06/13/17 EF 55-60, no RWMA, Gr 1 DD, mod LAE, trivial TR, PASP 20, trivial eff  Myoview 11/2016 EF 50, breast atten, no ischemia, Low Risk  LHC 10/29/15 LM ok LAD prox stent patent with 30 ISR, dist 70, D2 70 LCx mid 60, OM2 stent patent RCA ost 60, ost-prox 50, prox-mid 70, mid 50, RPDA 40 FFR of RCA 0.71 (hemodynamically significant) PCI: 3 x 28 mm Promus, 3 x 16 mm Promus, 3 x 12 mm Promus DES to RCA   Echo 10/29/15 EF 55-60, no RWMA, Gr 1 DD, MAC  Echo 11/16 EF 55-60, Gr 1 DD  LHC 2/16 LM ok LAD prox stent ok LCx stent ok RCA prox 40 EF 55-60   LHC (06/06/13):  prox LAD 90%, mid LAD stent ok with 40% ISR, small caliber Dx prox 40% mid 70% (inf sub-branch), OM1 stent ok, prox RCA 50% ,mid RCA 40%, EF 65%.  PCI: 3.0 x 22 mm Resolute DES to proximal LAD  Echo (02/2012):  EF 55% to 60%. Wall  motion was normal.   History of Present Illness:    [name] was admitted in 3/21 with unstable angina.  Cardiac catheterization demonstrated patent stents in the LAD, OM2, RCA and diffuse disease in the distal RCA supplying a small PDA and PL branch.  Medical Rx was recommended.  She was last seen in 6/21.  She noted occasional symptoms of chest pain.  I started her on low dose (15 mg) of Isosorbide. If her BP limits her ability to tolerate nitrates, we can try Ranolazine.  She returns for follow up.  ***      Past Medical History:  Diagnosis Date  . AAA (abdominal aortic aneurysm) ([place])    a. s/p stent graft repair 01/2016.  [name] Acute ischemic colitis ([place]) 07/29/2010   Diagnosed June, 2012 characterized by acute lower GI bleeding, spontaneously resolved.   . Acute respiratory infection 06/09/2014  . Acute sinus infection 05/14/2015  . Allergic rhinitis, cause unspecified   . Angioedema of lips 06/03/2014  . Anxiety state, unspecified   . Arthritis of left hip 04/16/2015  . Bilateral hearing loss 07/19/2017  . Chronic LBP   . Coronary artery disease    a. inferior MI  1998 s/p PCI of RCA. b. stenting of Cx 04/2010. c. DES to prox LAD 05/2013. d. DES x 3 in 10/2015. // Myoview 07/2018:  EF 59, normal perfusion, low risk   . Coronary artery disease involving native heart without angina pectoris 07/31/2006   Qualifier: Diagnosis of  By: [name] MD, [name]  ANGIOGRAPHIC DATA:  1. Ventriculography done in the RAO projection reveals a small wall  motion abnormality in the mid inferior wall. Ejection fraction  would be estimated at around 50%.  2. The right coronary artery has some progressive disease of about 60-  80% in the proximal mid segment. The distal vessel appears to be  50-70% narrowing althou  . Degeneration of lumbar or lumbosacral intervertebral disc   . Depressive disorder, not elsewhere classified   . Diabetes ([place]) 11/03/2006   Qualifier: Diagnosis of  By: [name] MD, [name]   . Diabetes  mellitus    TYPE II  . Frequent urination 05/08/2014  . Gout 08/22/2013  . History of endovascular stent graft for abdominal aortic aneurysm (AAA) 01/22/2016  . Hyperlipidemia   . Hypertension   . Hypothyroidism   . Iron deficiency anemia 06/13/2017  . Lower GI bleed 06/2010   Diverticular bleed  . Lumbar radiculopathy 05/15/2015  . MENOPAUSAL DISORDER 10/29/2007   Qualifier: Diagnosis of  By: [name] MD, [name]   . Myalgia 09/25/2013  . Noncompliance with medications 02/26/2014  . Obesity, unspecified   . OTITIS MEDIA, SEROUS, CHRONIC 04/23/2007   Qualifier: Diagnosis of  By: [name] MD, [name]   . Thoracic disc disease 11/19/2018  . URTICARIA 09/23/2009   Qualifier: Diagnosis of  By: [name] MD, [name] Venous insufficiency 07/19/2017    Current Medications: No outpatient medications have been marked as taking for the 10/28/19 encounter (Appointment) with Richardson Dopp T, PA-C.     Allergies:   Crestor [rosuvastatin calcium], Prilosec [omeprazole], Miconazole nitrate, Augmentin [amoxicillin-pot clavulanate], and Doxycycline   Social History   Tobacco Use  . Smoking status: Former Smoker    Years: 30.00    Types: Cigarettes    Quit date: 05/31/1986    Years since quitting: 33.4  . Smokeless tobacco: Never Used  Vaping Use  . Vaping Use: Never used  Substance Use Topics  . Alcohol use: No  . Drug use: No     Family Hx: The patient's family history includes Arthritis in her maternal aunt; Breast cancer in her maternal aunt; Diabetes in her brother and maternal aunt; Heart attack (age of onset: 34) in her mother; Heart attack (age of onset: 49) in her father; Heart disease in her father and mother. There is no history of Colon cancer.  ROS   EKGs/Labs/Other Test Reviewed:    EKG:  EKG is *** ordered today.  The ekg ordered today demonstrates ***  Recent Labs: 03/18/2019: TSH 4.17 04/04/2019: BUN 10; Creatinine, Ser 0.92; Hemoglobin 12.5; Platelets 236; Potassium 4.0; Sodium  144 09/12/2019: ALT 9   Recent Lipid Panel Lab Results  Component Value Date/Time   CHOL 183 09/12/2019 02:54 PM   TRIG 94 09/12/2019 02:54 PM   TRIG 102 01/02/2006 08:25 AM   HDL 38 (L) 09/12/2019 02:54 PM   CHOLHDL 4.8 (H) 09/12/2019 02:54 PM   CHOLHDL 5.8 04/04/2019 04:11 AM   LDLCALC 128 (H) 09/12/2019 02:54 PM   LDLDIRECT 131.0 03/27/2015 11:37 AM    Physical Exam:    VS:  There were no vitals taken for this  visit.    Wt Readings from Last 3 Encounters:  09/19/19 217 lb (98.4 kg)  07/17/19 213 lb 3.2 oz (96.7 kg)  04/17/19 221 lb 12.8 oz (100.6 kg)     Physical Exam ***  ASSESSMENT & PLAN:    *** 1. Coronary artery disease of native artery of native heart with stable angina pectoris ([place]) Prior history of stenting to the LAD, LCx and RCA.  Cardiac catheterization in March 2021 demonstrated diffuse distal RCA disease leading into a severely diseased small PDA.  Medical therapy has been recommended.  She has occasional substernal chest discomfort that is not very typical for angina.  She has not taken nitroglycerin for this.  She has not tried taking isosorbide.  Her blood pressure is somewhat borderline low.  I have recommended trying to take isosorbide 15 mg daily.  If she has symptomatic hypotension with this or no significant improvement, we can certainly discontinue it.  If her blood pressure limits antianginal therapy, we could consider ranolazine.  Continue aspirin.  Start taking atorvastatin on a regular basis.  Continue beta-blocker.  Follow-up in 3 months.  2. Essential hypertension Blood pressure is well controlled.  As noted, it is on the low side may limit antianginal therapy.  Continue current dose of atenolol, hydrochlorothiazide.  Start isosorbide as outlined above.  3. Mixed hyperlipidemia She has not been taking atorvastatin on a regular basis.  She is also not taking ezetimibe.  We discussed the importance of aggressive lipid management to reduce her risk  of future events.  Start atorvastatin 20 mg daily.  Obtain lipids and LFTs in 8 weeks.   Dispo:  No follow-ups on file.   Medication Adjustments/Labs and Tests Ordered: Current medicines are reviewed at length with the patient today.  Concerns regarding medicines are outlined above.  Tests Ordered: No orders of the defined types were placed in this encounter.  Medication Changes: No orders of the defined types were placed in this encounter.   Signed, Richardson Dopp, PA-C  10/28/2019 1:41 PM    [facility] [place], [place], [place]  [address] Phone: [phone]; Fax: [phone]

## 2019-11-15 ENCOUNTER — Ambulatory Visit (INDEPENDENT_AMBULATORY_CARE_PROVIDER_SITE_OTHER): Payer: Medicare HMO | Admitting: Internal Medicine

## 2019-11-15 ENCOUNTER — Other Ambulatory Visit: Payer: Self-pay

## 2019-11-15 VITALS — BP 122/76 | HR 66 | Temp 98.4°F | Ht 65.0 in | Wt 216.0 lb

## 2019-11-15 DIAGNOSIS — M109 Gout, unspecified: Secondary | ICD-10-CM

## 2019-11-15 DIAGNOSIS — E11311 Type 2 diabetes mellitus with unspecified diabetic retinopathy with macular edema: Secondary | ICD-10-CM

## 2019-11-15 DIAGNOSIS — R531 Weakness: Secondary | ICD-10-CM

## 2019-11-15 DIAGNOSIS — Z23 Encounter for immunization: Secondary | ICD-10-CM

## 2019-11-15 DIAGNOSIS — I10 Essential (primary) hypertension: Secondary | ICD-10-CM

## 2019-11-15 MED ORDER — METHYLPREDNISOLONE ACETATE 80 MG/ML IJ SUSP
80.0000 mg | Freq: Once | INTRAMUSCULAR | Status: AC
  Administered 2019-11-15: 80 mg via INTRAMUSCULAR

## 2019-11-15 MED ORDER — CEFTRIAXONE SODIUM 1 G IJ SOLR: 1.0000 g | Freq: Once | INTRAMUSCULAR | Status: DC

## 2019-11-15 MED ORDER — PREDNISONE 10 MG PO TABS
ORAL_TABLET | ORAL | 0 refills | Status: DC
Start: 2019-11-15 — End: 2020-03-30

## 2019-11-15 MED ORDER — ALLOPURINOL 300 MG PO TABS
300.0000 mg | ORAL_TABLET | ORAL | 3 refills | Status: DC | PRN
Start: 2019-11-15 — End: 2020-10-26

## 2019-11-15 MED ORDER — INDOMETHACIN 50 MG PO CAPS: 50.0000 mg | ORAL_CAPSULE | Freq: Three times a day (TID) | ORAL | 3 refills | Status: DC | PRN

## 2019-11-15 NOTE — Progress Notes (Signed)
Subjective:    Patient ID: [name], female    DOB: April 06, 1945, 74 y.o.   MRN: [id number]  HPI  Here with c/o 1 wk sudden onset right first finger PIP pain and swelling, without fever or trauma. Has hx of gout, and slight episodes in past but this one not improving.  Nothing seems to make better or worse.  Due for flu shot.  Pt denies chest pain, increased sob or doe, wheezing, orthopnea, PND, increased LE swelling, palpitations, dizziness or syncope.  Pt denies new neurological symptoms such as new headache, or facial or extremity weakness or numbness   Pt denies polydipsia, polyuria.   Past Medical History:  Diagnosis Date  . AAA (abdominal aortic aneurysm) ([place])    a. s/p stent graft repair 01/2016.  [name] Acute ischemic colitis ([place]) 07/29/2010   Diagnosed June, 2012 characterized by acute lower GI bleeding, spontaneously resolved.   . Acute respiratory infection 06/09/2014  . Acute sinus infection 05/14/2015  . Allergic rhinitis, cause unspecified   . Angioedema of lips 06/03/2014  . Anxiety state, unspecified   . Arthritis of left hip 04/16/2015  . Bilateral hearing loss 07/19/2017  . Chronic LBP   . Coronary artery disease    a. inferior MI 1998 s/p PCI of RCA. b. stenting of Cx 04/2010. c. DES to prox LAD 05/2013. d. DES x 3 in 10/2015. // Myoview 07/2018:  EF 59, normal perfusion, low risk   . Coronary artery disease involving native heart without angina pectoris 07/31/2006   Qualifier: Diagnosis of  By: [name] MD, [name]  ANGIOGRAPHIC DATA:  1. Ventriculography done in the RAO projection reveals a small wall  motion abnormality in the mid inferior wall. Ejection fraction  would be estimated at around 50%.  2. The right coronary artery has some progressive disease of about 60-  80% in the proximal mid segment. The distal vessel appears to be  50-70% narrowing althou  . Degeneration of lumbar or lumbosacral intervertebral disc   . Depressive disorder, not elsewhere classified   . Diabetes  ([place]) 11/03/2006   Qualifier: Diagnosis of  By: [name] MD, [name]   . Diabetes mellitus    TYPE II  . Frequent urination 05/08/2014  . Gout 08/22/2013  . History of endovascular stent graft for abdominal aortic aneurysm (AAA) 01/22/2016  . Hyperlipidemia   . Hypertension   . Hypothyroidism   . Iron deficiency anemia 06/13/2017  . Lower GI bleed 06/2010   Diverticular bleed  . Lumbar radiculopathy 05/15/2015  . MENOPAUSAL DISORDER 10/29/2007   Qualifier: Diagnosis of  By: [name] MD, [name]   . Myalgia 09/25/2013  . Noncompliance with medications 02/26/2014  . Obesity, unspecified   . OTITIS MEDIA, SEROUS, CHRONIC 04/23/2007   Qualifier: Diagnosis of  By: [name] MD, [name]   . Thoracic disc disease 11/19/2018  . URTICARIA 09/23/2009   Qualifier: Diagnosis of  By: [name] MD, [name] Venous insufficiency 07/19/2017   Past Surgical History:  Procedure Laterality Date  . CARDIAC CATHETERIZATION     PCI OF BOTH THE CIRCUMFLEX AND LEFT ANTERIOR DESCENDING ARTERY  . CARDIAC CATHETERIZATION N/A 10/29/2015   Procedure: Coronary Stent Intervention;  Surgeon: Nelva Bush, MD;  Location: [facility];  Service: Cardiovascular;  Laterality: N/A;  . CARDIAC CATHETERIZATION N/A 10/29/2015   Procedure: Coronary/Graft Angiography;  Surgeon: Nelva Bush, MD;  Location: [facility];  Service: Cardiovascular;  Laterality: N/A;  . CARDIAC  CATHETERIZATION N/A 10/29/2015   Procedure: Intravascular Pressure Wire/FFR Study;  Surgeon: Nelva Bush, MD;  Location: [facility];  Service: Cardiovascular;  Laterality: N/A;  . CESAREAN SECTION    . ENDOVASCULAR STENT INSERTION N/A 01/22/2016   Procedure: ABDOMINAL AORTIC ENDOVASCULAR STENT GRAFT INSERTION;  Surgeon: Rosetta Posner, MD;  Location: [facility];  Service: Vascular;  Laterality: N/A;  . HEART STENT  04-2010  and  Jun 07, 2013   X 3  . LEFT HEART CATH AND CORONARY ANGIOGRAPHY N/A 04/03/2019   Procedure: LEFT HEART CATH AND CORONARY  ANGIOGRAPHY;  Surgeon: Nelva Bush, MD;  Location: [facility];  Service: Cardiovascular;  Laterality: N/A;  . LEFT HEART CATHETERIZATION WITH CORONARY ANGIOGRAM N/A 06/07/2013   Procedure: LEFT HEART CATHETERIZATION WITH CORONARY ANGIOGRAM;  Surgeon: [name], MD;  Location: [facility];  Service: Cardiovascular;  Laterality: N/A;  . LEFT HEART CATHETERIZATION WITH CORONARY ANGIOGRAM N/A 02/25/2014   Procedure: LEFT HEART CATHETERIZATION WITH CORONARY ANGIOGRAM;  Surgeon: Troy Sine, MD;  Location: [facility];  Service: Cardiovascular;  Laterality: N/A;  . LUMBAR FUSION  01/2007   DR. Patrice Paradise...3-LEVEL WITH FIXATION  . OOPHORECTOMY     BSO? pt.unsure  . PARATHYROIDECTOMY    . SPINE SURGERY    . THYROIDECTOMY    . TOTAL ABDOMINAL HYSTERECTOMY      reports that she quit smoking about 33 years ago. Her smoking use included cigarettes. She quit after 30.00 years of use. She has never used smokeless tobacco. She reports that she does not drink alcohol and does not use drugs. family history includes Arthritis in her maternal aunt; Breast cancer in her maternal aunt; Diabetes in her brother and maternal aunt; Heart attack (age of onset: 53) in her mother; Heart attack (age of onset: 63) in her father; Heart disease in her father and mother. Allergies  Allergen Reactions  . Crestor [Rosuvastatin Calcium] Other (See Comments)    Feeling poor  . Prilosec [Omeprazole] Other (See Comments)    Chest pain  . Miconazole Nitrate Hives    REACTION: hives  . Augmentin [Amoxicillin-Pot Clavulanate] Hives, Itching and Rash    Has patient had a PCN reaction causing immediate rash, facial/tongue/throat swelling, SOB or lightheadedness with hypotension:unsure Has patient had a PCN reaction causing severe rash involving mucus membranes or skin necrosis:unsure Has patient had a PCN reaction that required hospitalization:No Has patient had a PCN reaction occurring within the last 10  years:NO If all of the above answers are "NO", then may proceed with Cephalosporin use. Has patient had a PCN reaction causing immediate rash, facial/tongue/throat swelling, SOB or lightheadedness with hypotension:unsure Has patient had a PCN reaction causing severe rash involving mucus membranes or skin necrosis:unsure Has patient had a PCN reaction that required hospitalization:No Has patient had a PCN reaction occurring within the last 10 years:NO If all of the above answers are "NO", then may proceed with Cephalosporin use.   [name] Doxycycline Other (See Comments)    REACTION: gi upset   Current Outpatient Medications on File Prior to Visit  Medication Sig Dispense Refill  . aspirin 81 MG EC tablet Take 81 mg by mouth daily.     [name] atenolol (TENORMIN) 25 MG tablet TAKE 1 TABLET(25 MG) BY MOUTH TWICE DAILY 180 tablet 1  . atorvastatin (LIPITOR) 20 MG tablet Take 1 tablet (20 mg total) by mouth daily. 90 tablet 3  . azithromycin (ZITHROMAX) 250 MG tablet 2 tab by mouth day  1, then 1 per day 6 tablet 1  . celecoxib (CELEBREX) 200 MG capsule Take 1 capsule (200 mg total) by mouth 2 (two) times daily as needed for moderate pain. 180 capsule 1  . cyclobenzaprine (FLEXERIL) 5 MG tablet Take 1 tablet (5 mg total) by mouth 3 (three) times daily as needed for muscle spasms. 30 tablet 2  . docusate sodium (COLACE) 100 MG capsule Take 1 capsule (100 mg total) by mouth 2 (two) times daily as needed for mild constipation. 30 capsule 1  . ezetimibe (ZETIA) 10 MG tablet Take 1 tablet (10 mg total) by mouth daily. 90 tablet 1  . fexofenadine (ALLEGRA) 180 MG tablet Take 1 tablet (180 mg total) by mouth as needed for allergies or rhinitis. 90 tablet 3  . gabapentin (NEURONTIN) 300 MG capsule Take 300 mg by mouth as needed (pain).    . hydrochlorothiazide (MICROZIDE) 12.5 MG capsule Take 1 capsule (12.5 mg total) by mouth daily. 90 capsule 3  . hydrocortisone 1 % lotion Apply 1 application topically 2 (two)  times daily. Apply to affected area 96 g 0  . isosorbide mononitrate (IMDUR) 30 MG 24 hr tablet Take 0.5 tablets (15 mg total) by mouth daily. 45 tablet 3  . metFORMIN (GLUCOPHAGE-XR) 500 MG 24 hr tablet     . nitroGLYCERIN (NITROSTAT) 0.4 MG SL tablet Place 1 tablet (0.4 mg total) under the tongue every 5 (five) minutes as needed for chest pain. 25 tablet 3  . oxyCODONE (OXY IR/ROXICODONE) 5 MG immediate release tablet 4 (four) times daily as needed.    . pantoprazole (PROTONIX) 40 MG tablet Take 40 mg by mouth as needed (heartburn acid reflux).     . pregabalin (LYRICA) 75 MG capsule Take 75 mg by mouth daily as needed (pain).     . SYNTHROID 150 MCG tablet TAKE 1 TABLET(150 MCG) BY MOUTH DAILY 90 tablet 1  . tiZANidine (ZANAFLEX) 2 MG tablet Take 1 tablet (2 mg total) by mouth every 6 (six) hours as needed for muscle spasms. 40 tablet 1  . triamcinolone (NASACORT) 55 MCG/ACT AERO nasal inhaler Place 2 sprays into the nose as needed (congestion). 1 each 12   No current facility-administered medications on file prior to visit.   Review of Systems All otherwise neg per pt    Objective:   Physical Exam BP 122/76   Pulse 66   Temp 98.4 F (36.9 C) (Oral)   Ht 5\' 5"  (1.651 m)   Wt 216 lb (98 kg)   SpO2 98%   BMI 35.94 kg/m  VS noted,  Constitutional: Pt appears in NAD HENT: Head: NCAT.  Right Ear: External ear normal.  Left Ear: External ear normal.  Eyes: . Pupils are equal, round, and reactive to light. Conjunctivae and EOM are normal Nose: without d/c or deformity Neck: Neck supple. Gross normal ROM Cardiovascular: Normal rate and regular rhythm.   Pulmonary/Chest: Effort normal and breath sounds without rales or wheezing.  Right first finger PIP with 3+ swelling, tender.. severe decreased ROM Neurological: Pt is alert. At baseline orientation, motor grossly intact Skin: Skin is warm. No rashes, other new lesions, no LE edema Psychiatric: Pt behavior is normal without  agitation  All otherwise neg per pt Lab Results  Component Value Date   WBC 7.3 04/04/2019   HGB 12.5 04/04/2019   HCT 39.9 04/04/2019   PLT 236 04/04/2019   GLUCOSE 124 (H) 04/04/2019   CHOL 183 09/12/2019   TRIG 94 09/12/2019  HDL 38 (L) 09/12/2019   LDLDIRECT 131.0 03/27/2015   LDLCALC 128 (H) 09/12/2019   ALT 9 09/12/2019   AST 16 09/12/2019   NA 144 04/04/2019   K 4.0 04/04/2019   CL 108 04/04/2019   CREATININE 0.92 04/04/2019   BUN 10 04/04/2019   CO2 26 04/04/2019   TSH 4.17 03/18/2019   INR 1.08 01/22/2016   HGBA1C 5.9 (A) 09/19/2019   MICROALBUR 2.0 (H) 03/18/2019      Assessment & Plan:

## 2019-11-15 NOTE — Patient Instructions (Addendum)
You had the steroid shot today  Please take all new medication as prescribed - the prednisone  Please take all new medication as prescribed - the indomethacin ONLY for attacks that try to start up in the future  Please continue all other medications as before, including to restart the allopurinol  Please have the pharmacy call with any other refills you may need.  Please continue your efforts at being more active, low cholesterol diet, and weight control.  Please keep your appointments with your specialists as you may have planned

## 2019-11-16 ENCOUNTER — Encounter: Payer: Self-pay | Admitting: Internal Medicine

## 2019-11-16 DIAGNOSIS — R531 Weakness: Secondary | ICD-10-CM | POA: Insufficient documentation

## 2019-11-16 NOTE — Assessment & Plan Note (Signed)
Lab Results  Component Value Date   HGBA1C 5.9 (A) 09/19/2019   stable overall by history and exam, recent data reviewed with pt, and pt to continue medical treatment as before,  to f/u any worsening symptoms or concerns

## 2019-11-16 NOTE — Assessment & Plan Note (Signed)
After flu injection,  Pt felt suddenly weak and dizziness, unable to stand well and falls back into the chair trying to leave, felt poorly but VSS throughout, and responded to supportive care, benadryl 50 mg po x 1 from a sample on hand, as well as [place] sandwich and diet coke also happened to have on hand; after 30 min she is able to stand, walks out well with cane chaperoned by staff and insists she can drive home, refuses ems or other evaluation

## 2019-11-16 NOTE — Assessment & Plan Note (Addendum)
For depomedrol IM 80 mg, predpac asd, restrart allopurinol, and also indocin tid prn future attacks  I spent 41 minutes in preparing to see the patient by review of recent labs, imaging and procedures, obtaining and reviewing separately obtained history, communicating with the patient and family or caregiver, ordering medications, tests or procedures, and documenting clinical information in the EHR including the differential Dx, treatment, and any further evaluation and other management of acute gout, dm, generalized weakness, htn

## 2019-11-18 ENCOUNTER — Telehealth: Payer: Self-pay | Admitting: Internal Medicine

## 2019-11-18 DIAGNOSIS — M79644 Pain in right finger(s): Secondary | ICD-10-CM

## 2019-11-18 NOTE — Telephone Encounter (Signed)
Patients finger is still swollen and hurting and would like to speak to someone about it.

## 2019-11-19 NOTE — Telephone Encounter (Signed)
Spoke with pt and was able to inform her of Dr.John's instructions. Pt understood and has no questions or concerns at this time.

## 2019-11-19 NOTE — Telephone Encounter (Signed)
[place] for xray at [facility] when she can  [facility] for sport med referral - done

## 2019-11-19 NOTE — Telephone Encounter (Signed)
Sent to Dr. John to advise. 

## 2019-11-21 ENCOUNTER — Encounter: Payer: Self-pay | Admitting: Family Medicine

## 2019-11-21 ENCOUNTER — Ambulatory Visit (INDEPENDENT_AMBULATORY_CARE_PROVIDER_SITE_OTHER): Payer: Medicare HMO

## 2019-11-21 ENCOUNTER — Ambulatory Visit (INDEPENDENT_AMBULATORY_CARE_PROVIDER_SITE_OTHER): Payer: Medicare HMO | Admitting: Family Medicine

## 2019-11-21 ENCOUNTER — Ambulatory Visit: Payer: Self-pay

## 2019-11-21 ENCOUNTER — Other Ambulatory Visit: Payer: Self-pay

## 2019-11-21 VITALS — BP 122/74 | HR 70 | Ht 65.0 in | Wt 219.0 lb

## 2019-11-21 DIAGNOSIS — M25561 Pain in right knee: Secondary | ICD-10-CM

## 2019-11-21 DIAGNOSIS — M79641 Pain in right hand: Secondary | ICD-10-CM | POA: Diagnosis not present

## 2019-11-21 DIAGNOSIS — G8929 Other chronic pain: Secondary | ICD-10-CM

## 2019-11-21 DIAGNOSIS — M79644 Pain in right finger(s): Secondary | ICD-10-CM

## 2019-11-21 DIAGNOSIS — R600 Localized edema: Secondary | ICD-10-CM | POA: Diagnosis not present

## 2019-11-21 DIAGNOSIS — M25562 Pain in left knee: Secondary | ICD-10-CM

## 2019-11-21 MED ORDER — COLCHICINE 0.6 MG PO TABS: 0.6000 mg | ORAL_TABLET | Freq: Every day | ORAL | 2 refills | Status: DC | PRN

## 2019-11-21 MED ORDER — COLCHICINE 0.6 MG PO CAPS: ORAL_CAPSULE | ORAL | 1 refills | Status: DC

## 2019-11-21 NOTE — Progress Notes (Signed)
Subjective:    CC: R index finger pain  I, [name], LAT, ATC, am serving as scribe for Dr. Lynne Leader.  HPI: Pt is a 74 y/o female presenting w/ R index finger PIP pain and swelling x approximately 2 weeks. Pt was seen by her PCP on 11/15/19 and had a Depo 80 IM injection and was restarted on Allopurinol and Indocin. Today, pt reports just noticing swollen finger, no know mechanism, and then it began turning dark. Pt describes pn as throbbing. 400 mg IBU is helping with pn. Swelling: yes at R 1st PIP joint.  No injury to explain pain.  History of gout.  Pt also complains of chronic bilat knee pn. Pn is ant and deep. Difficulty moving from sitting to standing. Asked if possible to get injections in both knees.   Pertinent review of Systems: No fevers or chills  Relevant historical information: Hypertension, CAD, PVD, diabetes   Objective:    Vitals:   11/21/19 1259  BP: 122/74  Pulse: 70  SpO2: 96%   General: Well Developed, well nourished, and in no acute distress.   MSK: Right index finger swollen at PIP tender palpation decreased motion.  No erythema.  Right knee normal-appearing moderate effusion normal motion with crepitation.  Tender to palpation medial and lateral joint line.  Left knee normal-appearing moderate effusion normal motion with crepitation.  Tender to palpation medial and lateral joint line.  Lab and Radiology Results  X-ray images obtained today personally and independently interpreted.    Right second digit.  Severe diffuse DJD worse at PIP.  No acute fractures.  Right knee: Severe bone-on-bone DJD.  No acute fractures.   Left knee: Severe bone-on-bone DJD no acute fractures.  Calcifications associated with quad tendon superior to patella  Await formal radiology review  Diagnostic Limited MSK Ultrasound of: Right index finger PIP Large effusion with scattered calcifications around joint capsule dorsal radial and ulnar.  No fractures  however degree of DJD makes this difficult to ascertain. Impression: Joint effusion and DJD.  Procedure: Real-time Ultrasound Guided Injection of right knee superior lateral patellar space Device: Philips Affiniti 50G Images permanently stored and available for review in PACS Verbal informed consent obtained.  Discussed risks and benefits of procedure. Warned about infection bleeding damage to structures skin hypopigmentation and fat atrophy among others. Patient expresses understanding and agreement Time-out conducted.   Noted no overlying erythema, induration, or other signs of local infection.   Skin prepped in a sterile fashion.   Local anesthesia: Topical Ethyl chloride.   With sterile technique and under real time ultrasound guidance:  40 mg of Kenalog and 2 L of Marcaine injected into joint. Fluid seen entering the joint capsule.   Completed without difficulty   Pain immediately resolved suggesting accurate placement of the medication.   Advised to call if fevers/chills, erythema, induration, drainage, or persistent bleeding.   Images permanently stored and available for review in the ultrasound unit.  Impression: Technically successful ultrasound guided injection.   Procedure: Real-time Ultrasound Guided Injection of left knee superior lateral patellar space Device: Philips Affiniti 50G Images permanently stored and available for review in PACS Verbal informed consent obtained.  Discussed risks and benefits of procedure. Warned about infection bleeding damage to structures skin hypopigmentation and fat atrophy among others. Patient expresses understanding and agreement Time-out conducted.   Noted no overlying erythema, induration, or other signs of local infection.   Skin prepped in a sterile fashion.   Local anesthesia:  Topical Ethyl chloride.   With sterile technique and under real time ultrasound guidance:  40 mg of Kenalog and 2 mL of Marcaine injected into joint. Fluid seen  entering the joint capsule.   Completed without difficulty   Pain immediately resolved suggesting accurate placement of the medication.   Advised to call if fevers/chills, erythema, induration, drainage, or persistent bleeding.   Images permanently stored and available for review in the ultrasound unit.  Impression: Technically successful ultrasound guided injection.       Impression and Recommendations:    Assessment and Plan: 74 y.o. female with right index finger pain and swelling without injury.  Patient has existing DJD I suspect worsening due to gout or pseudogout.  Plan for trial of colchicine.  If not improving would proceed with either oral steroids or intra-articular injected steroids.  Recheck if not improving.  Bilateral knee pain due to  severe DJD.  Proceed with injection as above today.  Recheck as needed.  Ultimately patient will require joint replacement but she was not interested in discussing this today.  PDMP not reviewed this encounter. Orders Placed This Encounter  Procedures  . [place] LIMITED JOINT SPACE STRUCTURES UP RIGHT(NO LINKED CHARGES)    Order Specific Question:   Reason for Exam (SYMPTOM  OR DIAGNOSIS REQUIRED)    Answer:   R index finger pain    Order Specific Question:   Preferred imaging location?    Answer:   [place]  . DG Knee AP/LAT W/Sun[place] Left    Standing Status:   Future    Number of Occurrences:   1    Standing Expiration Date:   11/20/2020    Order Specific Question:   Reason for Exam (SYMPTOM  OR DIAGNOSIS REQUIRED)    Answer:   BL knee pain    Order Specific Question:   Preferred imaging location?    Answer:   [facility]  . DG Knee AP/LAT W/Sunrise Right    Standing Status:   Future    Number of Occurrences:   1    Standing Expiration Date:   11/20/2020    Order Specific Question:   Reason for Exam (SYMPTOM  OR DIAGNOSIS REQUIRED)    Answer:   BL knee pain    Order Specific Question:   Preferred  imaging location?    Answer:   [facility]   Meds ordered this encounter  Medications  . colchicine 0.6 MG tablet    Sig: Take 1 tablet (0.6 mg total) by mouth daily as needed (gout pain).    Dispense:  30 tablet    Refill:  2  . Colchicine (MITIGARE) 0.6 MG CAPS    Sig: Take 1 Tablet (0.6MG ) by mouth daily as needed for gout pain    Dispense:  30 capsule    Refill:  1    Discussed warning signs or symptoms. Please see discharge instructions. Patient expresses understanding.   The above documentation has been reviewed and is accurate and complete Lynne Leader, M.D.

## 2019-11-21 NOTE — Progress Notes (Deleted)
   I, [name], LAT, ATC acting as a scribe for Lynne Leader, MD.  Subjective:    I'm seeing this patient as a consultation for Dr. [name]. Note will be routed back to referring provider/PCP.  CC: Finger pn  HPI: Pt states pn is located ///. MOI: Aggravates. Alleviates: Rx tried:  Past medical history, Surgical history, Family history, Social history, Allergies, and medications have been entered into the medical record, reviewed. ***  Review of Systems: No new headache, visual changes, nausea, vomiting, diarrhea, constipation, dizziness, abdominal pain, skin rash, fevers, chills, night sweats, weight loss, swollen lymph nodes, body aches, joint swelling, muscle aches, chest pain, shortness of breath, mood changes, visual or auditory hallucinations.   Objective:   There were no vitals filed for this visit. General: Well Developed, well nourished, and in no acute distress.  Neuro/Psych: Alert and oriented x3, extra-ocular muscles intact, able to move all 4 extremities, sensation grossly intact. Skin: Warm and dry, no rashes noted.  Respiratory: Not using accessory muscles, speaking in full sentences, trachea midline.  Cardiovascular: Pulses palpable, no extremity edema. Abdomen: Does not appear distended. MSK: ***  Lab and Radiology Results No results found for this or any previous visit (from the past 72 hour(s)). No results found.  Impression and Recommendations:    Assessment and Plan: 74 y.o. female with ***.  PDMP not reviewed this encounter. No orders of the defined types were placed in this encounter.  No orders of the defined types were placed in this encounter.   Discussed warning signs or symptoms. Please see discharge instructions. Patient expresses understanding.   ***

## 2019-11-21 NOTE — Patient Instructions (Addendum)
You had B knee injections today.  Call or go to the ER if you develop a large red swollen joint with extreme pain or oozing puss.   Start the colchicine daily as needed for finger pain.  Ok to hold the atorvastatin while you are taking the colchicine.   Let me know if this is not working.  I could see you as early as next week.

## 2019-11-22 ENCOUNTER — Ambulatory Visit: Payer: Medicare HMO | Admitting: Family Medicine

## 2019-11-24 ENCOUNTER — Encounter: Payer: Self-pay | Admitting: Internal Medicine

## 2019-11-25 ENCOUNTER — Telehealth: Payer: Self-pay

## 2019-11-25 MED ORDER — COLCHICINE 0.6 MG PO TABS
0.6000 mg | ORAL_TABLET | Freq: Every day | ORAL | 2 refills | Status: DC | PRN
Start: 2019-11-25 — End: 2019-12-23

## 2019-11-25 NOTE — Progress Notes (Signed)
X-ray left knee shows severe arthritis

## 2019-11-25 NOTE — Telephone Encounter (Signed)
Not sure why that medicine is so expensive with your insurance.  I have looked on good [employer] and a 30 pill prescription of colchicine tablets using good Rx coupon should be about $35.  This is using good Rx at [facility].  It will be a little more expensive at [facility] something like $60.  I have printed you a coupon for good Rx and a printed prescription that you can take anywhere for the colchicine.  I do recommend trying it as I do think it will be helpful.  Both the coupon and the prescription are ready for pickup at the front desk although I can send the prescription electronically to any [facility] you would like I does need to know which one.

## 2019-11-25 NOTE — Telephone Encounter (Signed)
[facility] on [facility] if you want to send electronically.  Pt states that she will stop by the office tomorrow to pick up the GoodRx coupon and the hard copy rx.

## 2019-11-25 NOTE — Progress Notes (Signed)
X-ray right knee shows severe arthritis

## 2019-12-09 ENCOUNTER — Ambulatory Visit: Payer: Medicare HMO | Admitting: Physician Assistant

## 2019-12-09 ENCOUNTER — Other Ambulatory Visit: Payer: Self-pay

## 2019-12-09 NOTE — Progress Notes (Deleted)
{Choose 1 Note Type (Video or Telephone):[id number]}    Date:  12/09/2019   ID:  [name], DOB January 17, 1946, MRN [id number] The patient was identified using 2 identifiers.  {Patient Location:[phone]::"Home"} {Provider Location:[phone]::"Home Office"}  PCP:  Biagio Borg, MD  Cardiologist:  Sherren Mocha, MD *** Electrophysiologist:  None   Evaluation Performed:  {Choose Visit HUDJ:[id number]::"YIFOYD-XA Visit"}  Chief Complaint:  ***  Patient Profile: [name] is a 74 y.o. female with:  Coronary artery disease  ? s/p prior inf MI in 1998 tx with stent to the RCA ? S/p stent LCx in 2012 ? S/p stent to the LAD in 2015 ? S/p DES x 3 to the RCA 2017 ? Myoview 07/2018: low risk ? Cath 03/2019: LAD, OM2 and RCA stents patent; dRCA diff dz'd supplying small/dz'd PDA+PL br (not ideal for PCI) >> med Rx  Diabetes mellitus   Hypertension  Hyperlipidemia  AAA ? S/p EVAR 01/2016  Prior CV studies: Cardiac catheterization 2019/04/22 LM normal LAD prox stent ok with 30 ISR, dist 75; D2 75 LCx prox 60, mid 60; OM2 stent ok RCA ost stent ok, mid 70, dist 57; RPDA w/ severe dz EF 55-65   Myoview 08/17/2018 Normal perfusion; EF 59; Low Risk  Echocardiogram 02/09/2018 EF 50-55, no RWMA, mod MAC  Echocardiogram 06/13/17 EF 55-60, no RWMA, Gr 1 DD, mod LAE, trivial TR, PASP 20, trivial eff     History of Present Illness:   [name] ***   Past Medical History:  Diagnosis Date  . AAA (abdominal aortic aneurysm) ([place])    a. s/p stent graft repair 01/2016.  [name] Acute ischemic colitis ([place]) 07/29/2010   Diagnosed June, 2012 characterized by acute lower GI bleeding, spontaneously resolved.   . Acute respiratory infection 06/09/2014  . Acute sinus infection 05/14/2015  . Allergic rhinitis, cause unspecified   . Angioedema of lips 06/03/2014  . Anxiety state, unspecified   . Arthritis of left hip 04/16/2015  . Bilateral hearing loss 07/19/2017  . Chronic LBP   .  Coronary artery disease    a. inferior MI 1998 s/p PCI of RCA. b. stenting of Cx 04/2010. c. DES to prox LAD 05/2013. d. DES x 3 in 10/2015. // Myoview 07/2018:  EF 59, normal perfusion, low risk   . Coronary artery disease involving native heart without angina pectoris 07/31/2006   Qualifier: Diagnosis of  By: [name] MD, [name]  ANGIOGRAPHIC DATA:  1. Ventriculography done in the RAO projection reveals a small wall  motion abnormality in the mid inferior wall. Ejection fraction  would be estimated at around 50%.  2. The right coronary artery has some progressive disease of about 60-  80% in the proximal mid segment. The distal vessel appears to be  50-70% narrowing althou  . Degeneration of lumbar or lumbosacral intervertebral disc   . Depressive disorder, not elsewhere classified   . Diabetes ([place]) 11/03/2006   Qualifier: Diagnosis of  By: [name] MD, [name]   . Diabetes mellitus    TYPE II  . Frequent urination 05/08/2014  . Gout 08/22/2013  . History of endovascular stent graft for abdominal aortic aneurysm (AAA) 01/22/2016  . Hyperlipidemia   . Hypertension   . Hypothyroidism   . Iron deficiency anemia 06/13/2017  . Lower GI bleed 06/2010   Diverticular bleed  . Lumbar radiculopathy 05/15/2015  . MENOPAUSAL DISORDER 10/29/2007   Qualifier: Diagnosis of  By: [name] MD, [name]   . Myalgia 09/25/2013  .  Noncompliance with medications 02/26/2014  . Obesity, unspecified   . OTITIS MEDIA, SEROUS, CHRONIC 04/23/2007   Qualifier: Diagnosis of  By: [name] MD, [name]   . Thoracic disc disease 11/19/2018  . URTICARIA 09/23/2009   Qualifier: Diagnosis of  By: [name] MD, [name] Venous insufficiency 07/19/2017   Past Surgical History:  Procedure Laterality Date  . CARDIAC CATHETERIZATION     PCI OF BOTH THE CIRCUMFLEX AND LEFT ANTERIOR DESCENDING ARTERY  . CARDIAC CATHETERIZATION N/A 10/29/2015   Procedure: Coronary Stent Intervention;  Surgeon: Nelva Bush, MD;  Location: [place] [facility];   Service: Cardiovascular;  Laterality: N/A;  . CARDIAC CATHETERIZATION N/A 10/29/2015   Procedure: Coronary/Graft Angiography;  Surgeon: Nelva Bush, MD;  Location: [place] [facility];  Service: Cardiovascular;  Laterality: N/A;  . CARDIAC CATHETERIZATION N/A 10/29/2015   Procedure: Intravascular Pressure Wire/FFR Study;  Surgeon: Nelva Bush, MD;  Location: [place] [facility];  Service: Cardiovascular;  Laterality: N/A;  . CESAREAN SECTION    . ENDOVASCULAR STENT INSERTION N/A 01/22/2016   Procedure: ABDOMINAL AORTIC ENDOVASCULAR STENT GRAFT INSERTION;  Surgeon: Rosetta Posner, MD;  Location: [place];  Service: Vascular;  Laterality: N/A;  . HEART STENT  04-2010  and  Jun 07, 2013   X 3  . LEFT HEART CATH AND CORONARY ANGIOGRAPHY N/A 04/03/2019   Procedure: LEFT HEART CATH AND CORONARY ANGIOGRAPHY;  Surgeon: Nelva Bush, MD;  Location: [place] [facility];  Service: Cardiovascular;  Laterality: N/A;  . LEFT HEART CATHETERIZATION WITH CORONARY ANGIOGRAM N/A 06/07/2013   Procedure: LEFT HEART CATHETERIZATION WITH CORONARY ANGIOGRAM;  Surgeon: [name], MD;  Location[facility] CATH LAB;  Service: Cardiovascular;  Laterality: N/A;  . LEFT HEART CATHETERIZATION WITH CORONARY ANGIOGRAM N/A 02/25/2014   Procedure: LEFT HEART CATHETERIZATION WITH CORONARY ANGIOGRAM;  Surgeon: Troy Sine, MD;  Location: [facility] CATH LAB;  Service: Cardiovascular;  Laterality: N/A;  . LUMBAR FUSION  01/2007   DR. Patrice Paradise...3-LEVEL WITH FIXATION  . OOPHORECTOMY     BSO? pt.unsure  . PARATHYROIDECTOMY    . SPINE SURGERY    . THYROIDECTOMY    . TOTAL ABDOMINAL HYSTERECTOMY       No outpatient medications have been marked as taking for the 12/09/19 encounter (Appointment) with Richardson Dopp T, PA-C.     Allergies:   Crestor [rosuvastatin calcium], Prilosec [omeprazole], Miconazole nitrate, Augmentin [amoxicillin-pot clavulanate], and Doxycycline   Social History   Tobacco Use  . Smoking status: Former Smoker     Years: 30.00    Types: Cigarettes    Quit date: 05/31/1986    Years since quitting: 33.5  . Smokeless tobacco: Never Used  Vaping Use  . Vaping Use: Never used  Substance Use Topics  . Alcohol use: No  . Drug use: No     Family Hx: The patient's family history includes Arthritis in her maternal aunt; Breast cancer in her maternal aunt; Diabetes in her brother and maternal aunt; Heart attack (age of onset: 45) in her mother; Heart attack (age of onset: 62) in her father; Heart disease in her father and mother. There is no history of Colon cancer.  ROS:   Please see the history of present illness.    *** All other systems reviewed and are negative.   Prior CV studies:   The following studies were reviewed today:  ***  Labs/Other Tests and Data Reviewed:    EKG:  {EKG/Telemetry Strips Reviewed:[phone]}  Recent Labs: 03/18/2019:  TSH 4.17 04/04/2019: BUN 10; Creatinine, Ser 0.92; Hemoglobin 12.5; Platelets 236; Potassium 4.0; Sodium 144 09/12/2019: ALT 9   Recent Lipid Panel Lab Results  Component Value Date/Time   CHOL 183 09/12/2019 02:54 PM   TRIG 94 09/12/2019 02:54 PM   TRIG 102 01/02/2006 08:25 AM   HDL 38 (L) 09/12/2019 02:54 PM   CHOLHDL 4.8 (H) 09/12/2019 02:54 PM   CHOLHDL 5.8 04/04/2019 04:11 AM   LDLCALC 128 (H) 09/12/2019 02:54 PM   LDLDIRECT 131.0 03/27/2015 11:37 AM    Wt Readings from Last 3 Encounters:  11/21/19 219 lb (99.3 kg)  11/15/19 216 lb (98 kg)  09/19/19 217 lb (98.4 kg)     Risk Assessment/Calculations:   {Does this patient have ATRIAL FIBRILLATION?:[phone]}  Objective:    Vital Signs:  There were no vitals taken for this visit.   {[facility]Care Virtual Exam (Optional):[phone]::"VITAL SIGNS:  reviewed"}  ASSESSMENT & PLAN:    ***  1. Coronary artery disease of native artery of native heart with stable angina pectoris (HCC) Prior history of stenting to the LAD, LCx and RCA.  Cardiac catheterization in March 2021 demonstrated  diffuse distal RCA disease leading into a severely diseased small PDA.  Medical therapy has been recommended.  She has occasional substernal chest discomfort that is not very typical for angina.  She has not taken nitroglycerin for this.  She has not tried taking isosorbide.  Her blood pressure is somewhat borderline low.  I have recommended trying to take isosorbide 15 mg daily.  If she has symptomatic hypotension with this or no significant improvement, we can certainly discontinue it.  If her blood pressure limits antianginal therapy, we could consider ranolazine.  Continue aspirin.  Start taking atorvastatin on a regular basis.  Continue beta-blocker.  Follow-up in 3 months.  2. Essential hypertension Blood pressure is well controlled.  As noted, it is on the low side may limit antianginal therapy.  Continue current dose of atenolol, hydrochlorothiazide.  Start isosorbide as outlined above.  3. Mixed hyperlipidemia She has not been taking atorvastatin on a regular basis.  She is also not taking ezetimibe.  We discussed the importance of aggressive lipid management to reduce her risk of future events.  Start atorvastatin 20 mg daily.  Obtain lipids and LFTs in 8 weeks.      Shared Decision Making/Informed Consent   {Are you ordering a CV Procedure (e.g. stress test, cath, DCCV, TEE, etc)?   Press F2        :[id number]}    COVID-19 Education: The signs and symptoms of COVID-19 were discussed with the patient and how to seek care for testing (follow up with PCP or arrange E-visit).  ***The importance of social distancing was discussed today.  Time:   Today, I have spent *** minutes with the patient with telehealth technology discussing the above problems.     Medication Adjustments/Labs and Tests Ordered: Current medicines are reviewed at length with the patient today.  Concerns regarding medicines are outlined above.   Tests Ordered: No orders of the defined types were placed in this  encounter.   Medication Changes: No orders of the defined types were placed in this encounter.   Follow Up:  {F/U Format:[phone]} {follow up:15908}  Signed, Richardson Dopp, PA-C  12/09/2019 2:33 PM    [facility]

## 2019-12-16 ENCOUNTER — Other Ambulatory Visit: Payer: Medicare HMO

## 2019-12-19 ENCOUNTER — Other Ambulatory Visit: Payer: Medicare HMO

## 2019-12-20 ENCOUNTER — Other Ambulatory Visit: Payer: Self-pay | Admitting: Internal Medicine

## 2019-12-23 ENCOUNTER — Other Ambulatory Visit: Payer: Self-pay

## 2019-12-23 ENCOUNTER — Encounter: Payer: Self-pay | Admitting: Internal Medicine

## 2019-12-23 ENCOUNTER — Ambulatory Visit (INDEPENDENT_AMBULATORY_CARE_PROVIDER_SITE_OTHER): Payer: Medicare HMO | Admitting: Internal Medicine

## 2019-12-23 VITALS — BP 120/70 | HR 83 | Temp 98.7°F | Ht 65.0 in | Wt 218.0 lb

## 2019-12-23 DIAGNOSIS — H9201 Otalgia, right ear: Secondary | ICD-10-CM

## 2019-12-23 DIAGNOSIS — I10 Essential (primary) hypertension: Secondary | ICD-10-CM | POA: Diagnosis not present

## 2019-12-23 DIAGNOSIS — E1165 Type 2 diabetes mellitus with hyperglycemia: Secondary | ICD-10-CM

## 2019-12-23 MED ORDER — COLCHICINE 0.6 MG PO TABS
0.6000 mg | ORAL_TABLET | Freq: Every day | ORAL | 2 refills | Status: DC | PRN
Start: 2019-12-23 — End: 2020-10-29

## 2019-12-23 MED ORDER — KETOROLAC TROMETHAMINE 30 MG/ML IJ SOLN
30.0000 mg | Freq: Once | INTRAMUSCULAR | Status: AC
  Administered 2019-12-23: 30 mg via INTRAVENOUS

## 2019-12-23 MED ORDER — LEVOFLOXACIN 500 MG PO TABS: 500.0000 mg | ORAL_TABLET | Freq: Every day | ORAL | 0 refills | Status: AC

## 2019-12-23 NOTE — Progress Notes (Signed)
Subjective:    Patient ID: [name], female    DOB: 07/15/1945, 74 y.o.   MRN: [id number]  HPI  Here to f/u; overall doing ok,  Pt denies chest pain, increasing sob or doe, wheezing, orthopnea, PND, increased LE swelling, palpitations, dizziness or syncope.  Pt denies new neurological symptoms such as new headache, or facial or extremity weakness or numbness.  Pt denies polydipsia, polyuria, or low sugar episode.  Pt states overall good compliance with meds, mostly trying to follow appropriate diet, with wt overall stable,  Does also have 3 days onset right ear pain with low grade temp, feels fatigued, ill, but no chills, Denies worsening reflux, abd pain, dysphagia, n/v, bowel change or blood.   Past Medical History:  Diagnosis Date  . AAA (abdominal aortic aneurysm) ([place])    a. s/p stent graft repair 01/2016.  [name] Acute ischemic colitis ([place]) 07/29/2010   Diagnosed June, 2012 characterized by acute lower GI bleeding, spontaneously resolved.   . Acute respiratory infection 06/09/2014  . Acute sinus infection 05/14/2015  . Allergic rhinitis, cause unspecified   . Angioedema of lips 06/03/2014  . Anxiety state, unspecified   . Arthritis of left hip 04/16/2015  . Bilateral hearing loss 07/19/2017  . Chronic LBP   . Coronary artery disease    a. inferior MI 1998 s/p PCI of RCA. b. stenting of Cx 04/2010. c. DES to prox LAD 05/2013. d. DES x 3 in 10/2015. // Myoview 07/2018:  EF 59, normal perfusion, low risk   . Coronary artery disease involving native heart without angina pectoris 07/31/2006   Qualifier: Diagnosis of  By: [name] MD, [name]  ANGIOGRAPHIC DATA:  1. Ventriculography done in the RAO projection reveals a small wall  motion abnormality in the mid inferior wall. Ejection fraction  would be estimated at around 50%.  2. The right coronary artery has some progressive disease of about 60-  80% in the proximal mid segment. The distal vessel appears to be  50-70% narrowing althou  . Degeneration  of lumbar or lumbosacral intervertebral disc   . Depressive disorder, not elsewhere classified   . Diabetes ([place]) 11/03/2006   Qualifier: Diagnosis of  By: [name] MD, [name]   . Diabetes mellitus    TYPE II  . Frequent urination 05/08/2014  . Gout 08/22/2013  . History of endovascular stent graft for abdominal aortic aneurysm (AAA) 01/22/2016  . Hyperlipidemia   . Hypertension   . Hypothyroidism   . Iron deficiency anemia 06/13/2017  . Lower GI bleed 06/2010   Diverticular bleed  . Lumbar radiculopathy 05/15/2015  . MENOPAUSAL DISORDER 10/29/2007   Qualifier: Diagnosis of  By: [name] MD, [name]   . Myalgia 09/25/2013  . Noncompliance with medications 02/26/2014  . Obesity, unspecified   . OTITIS MEDIA, SEROUS, CHRONIC 04/23/2007   Qualifier: Diagnosis of  By: [name] MD, [name]   . Thoracic disc disease 11/19/2018  . URTICARIA 09/23/2009   Qualifier: Diagnosis of  By: [name] MD, [name] Venous insufficiency 07/19/2017   Past Surgical History:  Procedure Laterality Date  . CARDIAC CATHETERIZATION     PCI OF BOTH THE CIRCUMFLEX AND LEFT ANTERIOR DESCENDING ARTERY  . CARDIAC CATHETERIZATION N/A 10/29/2015   Procedure: Coronary Stent Intervention;  Surgeon: Nelva Bush, MD;  Location: [place] CV [facility];  Service: Cardiovascular;  Laterality: N/A;  . CARDIAC CATHETERIZATION N/A 10/29/2015   Procedure: Coronary/Graft Angiography;  Surgeon: Nelva Bush, MD;  Location:  [place] INVASIVE CV LAB;  Service: Cardiovascular;  Laterality: N/A;  . CARDIAC CATHETERIZATION N/A 10/29/2015   Procedure: Intravascular Pressure Wire/FFR Study;  Surgeon: Nelva Bush, MD;  Location: [place] CV LAB;  Service: Cardiovascular;  Laterality: N/A;  . CESAREAN SECTION    . ENDOVASCULAR STENT INSERTION N/A 01/22/2016   Procedure: ABDOMINAL AORTIC ENDOVASCULAR STENT GRAFT INSERTION;  Surgeon: Rosetta Posner, MD;  Location: [place];  Service: Vascular;  Laterality: N/A;  . HEART STENT  04-2010  and  Jun 07, 2013    X 3  . LEFT HEART CATH AND CORONARY ANGIOGRAPHY N/A 04/03/2019   Procedure: LEFT HEART CATH AND CORONARY ANGIOGRAPHY;  Surgeon: Nelva Bush, MD;  Location: [place] [facility] LAB;  Service: Cardiovascular;  Laterality: N/A;  . LEFT HEART CATHETERIZATION WITH CORONARY ANGIOGRAM N/A 06/07/2013   Procedure: LEFT HEART CATHETERIZATION WITH CORONARY ANGIOGRAM;  Surgeon: [name], MD;  Location: [facility] CATH LAB;  Service: Cardiovascular;  Laterality: N/A;  . LEFT HEART CATHETERIZATION WITH CORONARY ANGIOGRAM N/A 02/25/2014   Procedure: LEFT HEART CATHETERIZATION WITH CORONARY ANGIOGRAM;  Surgeon: Troy Sine, MD;  Location: [facility] CATH LAB;  Service: Cardiovascular;  Laterality: N/A;  . LUMBAR FUSION  01/2007   DR. Patrice Paradise...3-LEVEL WITH FIXATION  . OOPHORECTOMY     BSO? pt.unsure  . PARATHYROIDECTOMY    . SPINE SURGERY    . THYROIDECTOMY    . TOTAL ABDOMINAL HYSTERECTOMY      reports that she quit smoking about 33 years ago. Her smoking use included cigarettes. She quit after 30.00 years of use. She has never used smokeless tobacco. She reports that she does not drink alcohol and does not use drugs. family history includes Arthritis in her maternal aunt; Breast cancer in her maternal aunt; Diabetes in her brother and maternal aunt; Heart attack (age of onset: 31) in her mother; Heart attack (age of onset: 69) in her father; Heart disease in her father and mother. Allergies  Allergen Reactions  . Crestor [Rosuvastatin Calcium] Other (See Comments)    Feeling poor  . Prilosec [Omeprazole] Other (See Comments)    Chest pain  . Miconazole Nitrate Hives    REACTION: hives  . Augmentin [Amoxicillin-Pot Clavulanate] Hives, Itching and Rash    Has patient had a PCN reaction causing immediate rash, facial/tongue/throat swelling, SOB or lightheadedness with hypotension:unsure Has patient had a PCN reaction causing severe rash involving mucus membranes or skin necrosis:unsure Has patient had a PCN  reaction that required hospitalization:No Has patient had a PCN reaction occurring within the last 10 years:NO If all of the above answers are "NO", then may proceed with Cephalosporin use. Has patient had a PCN reaction causing immediate rash, facial/tongue/throat swelling, SOB or lightheadedness with hypotension:unsure Has patient had a PCN reaction causing severe rash involving mucus membranes or skin necrosis:unsure Has patient had a PCN reaction that required hospitalization:No Has patient had a PCN reaction occurring within the last 10 years:NO If all of the above answers are "NO", then may proceed with Cephalosporin use.   [name] Doxycycline Other (See Comments)    REACTION: gi upset   Current Outpatient Medications on File Prior to Visit  Medication Sig Dispense Refill  . allopurinol (ZYLOPRIM) 300 MG tablet Take 1 tablet (300 mg total) by mouth as needed (for Gout). 90 tablet 3  . aspirin 81 MG EC tablet Take 81 mg by mouth daily.     [name] atenolol (TENORMIN) 25 MG tablet Take 1 tablet (25 mg total) by  mouth daily. TAKE 1 TABLET TWICE DAILY 180 tablet 0  . celecoxib (CELEBREX) 200 MG capsule Take 1 capsule (200 mg total) by mouth 2 (two) times daily as needed for moderate pain. 180 capsule 1  . cyclobenzaprine (FLEXERIL) 5 MG tablet Take 1 tablet (5 mg total) by mouth 3 (three) times daily as needed for muscle spasms. 30 tablet 2  . docusate sodium (COLACE) 100 MG capsule Take 1 capsule (100 mg total) by mouth 2 (two) times daily as needed for mild constipation. 30 capsule 1  . ezetimibe (ZETIA) 10 MG tablet Take 1 tablet (10 mg total) by mouth daily. 90 tablet 1  . fexofenadine (ALLEGRA) 180 MG tablet Take 1 tablet (180 mg total) by mouth as needed for allergies or rhinitis. 90 tablet 3  . gabapentin (NEURONTIN) 300 MG capsule Take 300 mg by mouth as needed (pain).    . hydrochlorothiazide (MICROZIDE) 12.5 MG capsule Take 1 capsule (12.5 mg total) by mouth daily. 90 capsule 3  .  hydrocortisone 1 % lotion Apply 1 application topically 2 (two) times daily. Apply to affected area 96 g 0  . indomethacin (INDOCIN) 50 MG capsule Take 1 capsule (50 mg total) by mouth 3 (three) times daily as needed (for gout attack). 90 capsule 3  . metFORMIN (GLUCOPHAGE-XR) 500 MG 24 hr tablet     . nitroGLYCERIN (NITROSTAT) 0.4 MG SL tablet Place 1 tablet (0.4 mg total) under the tongue every 5 (five) minutes as needed for chest pain. 25 tablet 3  . oxyCODONE (OXY IR/ROXICODONE) 5 MG immediate release tablet 4 (four) times daily as needed.    . pantoprazole (PROTONIX) 40 MG tablet Take 40 mg by mouth as needed (heartburn acid reflux).     . predniSONE (DELTASONE) 10 MG tablet 3 tabs by mouth per day for 3 days,2tabs per day for 3 days,1tab per day for 3 days 18 tablet 0  . pregabalin (LYRICA) 75 MG capsule Take 75 mg by mouth daily as needed (pain).     . SYNTHROID 150 MCG tablet TAKE 1 TABLET(150 MCG) BY MOUTH DAILY 90 tablet 1  . tiZANidine (ZANAFLEX) 2 MG tablet Take 1 tablet (2 mg total) by mouth every 6 (six) hours as needed for muscle spasms. 40 tablet 1  . triamcinolone (NASACORT) 55 MCG/ACT AERO nasal inhaler Place 2 sprays into the nose as needed (congestion). 1 each 12  . isosorbide mononitrate (IMDUR) 30 MG 24 hr tablet Take 0.5 tablets (15 mg total) by mouth daily. 45 tablet 3   No current facility-administered medications on file prior to visit.   Review of Systems All otherwise neg per pt    Objective:   Physical Exam BP 120/70 (BP Location: Left Arm, Patient Position: Sitting, Cuff Size: Large)   Pulse 83   Temp 98.7 F (37.1 C) (Oral)   Ht 5\' 5"  (1.651 m)   Wt 218 lb (98.9 kg)   SpO2 95%   BMI 36.28 kg/m  VS noted,  Constitutional: Pt appears in NAD HENT: Head: NCAT.  Right Ear: External ear normal. Right tm mod erythema Left Ear: External ear normal.  Eyes: . Pupils are equal, round, and reactive to light. Conjunctivae and EOM are normal Nose: without d/c or  deformity Neck: Neck supple. Gross normal ROM Cardiovascular: Normal rate and regular rhythm.   Pulmonary/Chest: Effort normal and breath sounds without rales or wheezing.  Neurological: Pt is alert. At baseline orientation, motor grossly intact Skin: Skin is warm. No rashes, other  new lesions, no LE edema Psychiatric: Pt behavior is normal without agitation  All otherwise neg per pt Lab Results  Component Value Date   WBC 7.3 04/04/2019   HGB 12.5 04/04/2019   HCT 39.9 04/04/2019   PLT 236 04/04/2019   GLUCOSE 124 (H) 04/04/2019   CHOL 162 12/25/2019   TRIG 132 12/25/2019   HDL 37 (L) 12/25/2019   LDLDIRECT 131.0 03/27/2015   LDLCALC 101 (H) 12/25/2019   ALT 10 12/25/2019   AST 15 12/25/2019   NA 144 04/04/2019   K 4.0 04/04/2019   CL 108 04/04/2019   CREATININE 0.92 04/04/2019   BUN 10 04/04/2019   CO2 26 04/04/2019   TSH 4.17 03/18/2019   INR 1.08 01/22/2016   HGBA1C 5.9 (A) 09/19/2019   MICROALBUR 2.0 (H) 03/18/2019      Assessment & Plan:

## 2019-12-23 NOTE — Patient Instructions (Signed)
You had the pain shot today (toradol)  Please take all new medication as prescribed - the antibiotic  Please continue all other medications as before, and refills have been done if requested.  Please have the pharmacy call with any other refills you may need.  Please continue your efforts at being more active, low cholesterol diet, and weight control.  Please keep your appointments with your specialists as you may have planned

## 2019-12-25 ENCOUNTER — Other Ambulatory Visit: Payer: Medicare HMO | Admitting: *Deleted

## 2019-12-25 ENCOUNTER — Other Ambulatory Visit: Payer: Self-pay

## 2019-12-25 DIAGNOSIS — M1711 Unilateral primary osteoarthritis, right knee: Secondary | ICD-10-CM | POA: Diagnosis not present

## 2019-12-25 DIAGNOSIS — G894 Chronic pain syndrome: Secondary | ICD-10-CM | POA: Diagnosis not present

## 2019-12-25 DIAGNOSIS — I25118 Atherosclerotic heart disease of native coronary artery with other forms of angina pectoris: Secondary | ICD-10-CM

## 2019-12-25 DIAGNOSIS — E782 Mixed hyperlipidemia: Secondary | ICD-10-CM

## 2019-12-25 DIAGNOSIS — M1712 Unilateral primary osteoarthritis, left knee: Secondary | ICD-10-CM | POA: Diagnosis not present

## 2019-12-25 DIAGNOSIS — M4325 Fusion of spine, thoracolumbar region: Secondary | ICD-10-CM | POA: Diagnosis not present

## 2019-12-25 LAB — HEPATIC FUNCTION PANEL
ALT: 10 IU/L (ref 0–32)
AST: 15 IU/L (ref 0–40)
Albumin: 3.9 g/dL (ref 3.7–4.7)
Alkaline Phosphatase: 115 IU/L (ref 44–121)
Bilirubin Total: 0.4 mg/dL (ref 0.0–1.2)
Bilirubin, Direct: 0.16 mg/dL (ref 0.00–0.40)
Total Protein: 7.5 g/dL (ref 6.0–8.5)

## 2019-12-25 LAB — LIPID PANEL
Chol/HDL Ratio: 4.4 ratio (ref 0.0–4.4)
Cholesterol, Total: 162 mg/dL (ref 100–199)
HDL: 37 mg/dL — ABNORMAL LOW (ref >39)
LDL Chol Calc (NIH): 101 mg/dL — ABNORMAL HIGH (ref 0–99)
Triglycerides: 132 mg/dL (ref 0–149)
VLDL Cholesterol Cal: 24 mg/dL (ref 5–40)

## 2019-12-27 ENCOUNTER — Telehealth: Payer: Self-pay

## 2019-12-27 DIAGNOSIS — E782 Mixed hyperlipidemia: Secondary | ICD-10-CM

## 2019-12-27 DIAGNOSIS — I25118 Atherosclerotic heart disease of native coronary artery with other forms of angina pectoris: Secondary | ICD-10-CM

## 2019-12-27 DIAGNOSIS — I10 Essential (primary) hypertension: Secondary | ICD-10-CM

## 2019-12-27 MED ORDER — ATORVASTATIN CALCIUM 40 MG PO TABS: 40.0000 mg | ORAL_TABLET | Freq: Every day | ORAL | 3 refills | Status: DC

## 2019-12-27 NOTE — Telephone Encounter (Signed)
-----   Message from Liliane Shi, [place] sent at 12/25/2019  5:03 PM EST ----- LDL above goal.  LFTs normal. PLAN:   -Increase atorvastatin to 40 mg daily  -Repeat fasting lipids, LFTs in 3 months Richardson Dopp, PA-C    12/25/2019 5:01 PM

## 2019-12-27 NOTE — Telephone Encounter (Signed)
The patient has been notified of the result and verbalized understanding.  All questions (if any) were answered. Patient will increase Atorvastatin to 40 mg and come back in 3 months for repeat blood work. Mady Haagensen, [place] 12/27/2019 11:28 AM

## 2019-12-28 ENCOUNTER — Encounter: Payer: Self-pay | Admitting: Internal Medicine

## 2019-12-28 DIAGNOSIS — H9201 Otalgia, right ear: Secondary | ICD-10-CM | POA: Insufficient documentation

## 2019-12-28 NOTE — Assessment & Plan Note (Addendum)
C/w otitis media, Mild to mod, for antibx course, toradol 30 mg IM, to f/u any worsening symptoms or concerns  I spent 31 minutes in preparing to see the patient by review of recent labs, imaging and procedures, obtaining and reviewing separately obtained history, communicating with the patient and family or caregiver, ordering medications, tests or procedures, and documenting clinical information in the EHR including the differential Dx, treatment, and any further evaluation and other management of right ear pain, dm, htn

## 2019-12-28 NOTE — Assessment & Plan Note (Signed)
stable overall by history and exam, recent data reviewed with pt, and pt to continue medical treatment as before,  to f/u any worsening symptoms or concerns  

## 2020-01-06 ENCOUNTER — Ambulatory Visit: Payer: Medicare HMO | Admitting: Physician Assistant

## 2020-01-07 ENCOUNTER — Ambulatory Visit: Payer: Medicare HMO | Admitting: Physician Assistant

## 2020-01-07 NOTE — Progress Notes (Deleted)
Cardiology Office Note:    Date:  01/07/2020   ID:  [name], DOB 15-Jun-1945, MRN [id number]  PCP:  Biagio Borg, MD  [facility] Cardiologist:  Sherren Mocha, MD *** [facility] Electrophysiologist:  None   Referring MD: Biagio Borg, MD   Chief Complaint:  No chief complaint on file.    Patient Profile:   [name] is a 74 y.o. female with:   Coronary artery disease  ? s/p prior inf MI in 1998 tx with stent to the RCA ? S/p stent LCx in 2012 ? S/p stent to the LAD in 2015 ? S/p DES x 3 to the RCA 2017 ? Myoview 07/2018: low risk ? Cath 03/2019: LAD, OM2 and RCA stents patent; dRCA diff dz'd supplying small/dz'd PDA+PL br (not ideal for PCI) >> med Rx  Diabetes mellitus   Hypertension  Hyperlipidemia  AAA ? S/p EVAR 01/2016  Prior CV studies: Cardiac catheterization 04/05/2019 LM normal LAD prox stent ok with 30 ISR, dist 75; D2 75 LCx prox 60, mid 60; OM2 stent ok RCA ost stent ok, mid 70, dist 55; RPDA w/ severe dz EF 55-65   Myoview 08/17/2018 Normal perfusion; EF 59; Low Risk  Echocardiogram 02/09/2018 EF 50-55, no RWMA, mod MAC  Echocardiogram 06/13/17 EF 55-60, no RWMA, Gr 1 DD, mod LAE, trivial TR, PASP 20, trivial eff   History of Present Illness:    [name] was last seen in 6/21. At that time, I encouraged her to start isosorbide to control angina. She returns for follow-up. ***      Past Medical History:  Diagnosis Date  . AAA (abdominal aortic aneurysm) ([place])    a. s/p stent graft repair 01/2016.  [name] Acute ischemic colitis ([place]) 07/29/2010   Diagnosed June, 2012 characterized by acute lower GI bleeding, spontaneously resolved.   . Acute respiratory infection 06/09/2014  . Acute sinus infection 05/14/2015  . Allergic rhinitis, cause unspecified   . Angioedema of lips 06/03/2014  . Anxiety state, unspecified   . Arthritis of left hip 04/16/2015  . Bilateral hearing loss 07/19/2017  . Chronic LBP   . Coronary artery disease     a. inferior MI 1998 s/p PCI of RCA. b. stenting of Cx 04/2010. c. DES to prox LAD 05/2013. d. DES x 3 in 10/2015. // Myoview 07/2018:  EF 59, normal perfusion, low risk   . Coronary artery disease involving native heart without angina pectoris 07/31/2006   Qualifier: Diagnosis of  By: [name] MD, [name]  ANGIOGRAPHIC DATA:  1. Ventriculography done in the RAO projection reveals a small wall  motion abnormality in the mid inferior wall. Ejection fraction  would be estimated at around 50%.  2. The right coronary artery has some progressive disease of about 60-  80% in the proximal mid segment. The distal vessel appears to be  50-70% narrowing althou  . Degeneration of lumbar or lumbosacral intervertebral disc   . Depressive disorder, not elsewhere classified   . Diabetes ([place]) 11/03/2006   Qualifier: Diagnosis of  By: [name] MD, [name]   . Diabetes mellitus    TYPE II  . Frequent urination 05/08/2014  . Gout 08/22/2013  . History of endovascular stent graft for abdominal aortic aneurysm (AAA) 01/22/2016  . Hyperlipidemia   . Hypertension   . Hypothyroidism   . Iron deficiency anemia 06/13/2017  . Lower GI bleed 06/2010   Diverticular bleed  . Lumbar radiculopathy 05/15/2015  . MENOPAUSAL  DISORDER 10/29/2007   Qualifier: Diagnosis of  By: [name] MD, [name]   . Myalgia 09/25/2013  . Noncompliance with medications 02/26/2014  . Obesity, unspecified   . OTITIS MEDIA, SEROUS, CHRONIC 04/23/2007   Qualifier: Diagnosis of  By: [name] MD, [name]   . Thoracic disc disease 11/19/2018  . URTICARIA 09/23/2009   Qualifier: Diagnosis of  By: [name] MD, [name] Venous insufficiency 07/19/2017    Current Medications: No outpatient medications have been marked as taking for the 01/07/20 encounter (Appointment) with Richardson Dopp T, PA-C.     Allergies:   Crestor [rosuvastatin calcium], Prilosec [omeprazole], Miconazole nitrate, Augmentin [amoxicillin-pot clavulanate], and Doxycycline   Social History    Tobacco Use  . Smoking status: Former Smoker    Years: 30.00    Types: Cigarettes    Quit date: 05/31/1986    Years since quitting: 33.6  . Smokeless tobacco: Never Used  Vaping Use  . Vaping Use: Never used  Substance Use Topics  . Alcohol use: No  . Drug use: No     Family Hx: The patient's family history includes Arthritis in her maternal aunt; Breast cancer in her maternal aunt; Diabetes in her brother and maternal aunt; Heart attack (age of onset: 97) in her mother; Heart attack (age of onset: 82) in her father; Heart disease in her father and mother. There is no history of Colon cancer.  ROS   EKGs/Labs/Other Test Reviewed:    EKG:  EKG is *** ordered today.  The ekg ordered today demonstrates ***  Recent Labs: 03/18/2019: TSH 4.17 04/04/2019: BUN 10; Creatinine, Ser 0.92; Hemoglobin 12.5; Platelets 236; Potassium 4.0; Sodium 144 12/25/2019: ALT 10   Recent Lipid Panel Lab Results  Component Value Date/Time   CHOL 162 12/25/2019 12:37 PM   TRIG 132 12/25/2019 12:37 PM   TRIG 102 01/02/2006 08:25 AM   HDL 37 (L) 12/25/2019 12:37 PM   CHOLHDL 4.4 12/25/2019 12:37 PM   CHOLHDL 5.8 04/04/2019 04:11 AM   LDLCALC 101 (H) 12/25/2019 12:37 PM   LDLDIRECT 131.0 03/27/2015 11:37 AM      Risk Assessment/Calculations:   {Does this patient have ATRIAL FIBRILLATION?[phone]}  Physical Exam:    VS:  There were no vitals taken for this visit.    Wt Readings from Last 3 Encounters:  12/23/19 218 lb (98.9 kg)  11/21/19 219 lb (99.3 kg)  11/15/19 216 lb (98 kg)     Physical Exam ***  ASSESSMENT & PLAN:    ***  Coronary artery disease  ? s/p prior inf MI in 1998 tx with stent to the RCA ? S/p stent LCx in 2012 ? S/p stent to the LAD in 2015 ? S/p DES x 3 to the RCA 2017 ? Myoview 07/2018: low risk ? Cath 03/2019: LAD, OM2 and RCA stents patent; dRCA diff dz'd supplying small/dz'd PDA+PL br (not ideal for PCI) >> med Rx  Diabetes mellitus    Hypertension  Hyperlipidemia  AAA ? S/p EVAR 01/2016  Prior CV studies: Cardiac catheterization 2019/04/22 LM normal LAD prox stent ok with 30 ISR, dist 75; D2 75 LCx prox 60, mid 60; OM2 stent ok RCA ost stent ok, mid 78, dist 48; RPDA w/ severe dz EF 55-65   Myoview 08/17/2018 Normal perfusion; EF 59; Low Risk  Echocardiogram 02/09/2018 EF 50-55, no RWMA, mod MAC  Echocardiogram 06/13/17 EF 55-60, no RWMA, Gr 1 DD, mod LAE, trivial TR, PASP 20, trivial eff  {Are you  ordering a CV Procedure (e.g. stress test, cath, DCCV, TEE, etc)?   Press F2        :[id number]}    Dispo:  No follow-ups on file.   Medication Adjustments/Labs and Tests Ordered: Current medicines are reviewed at length with the patient today.  Concerns regarding medicines are outlined above.  Tests Ordered: No orders of the defined types were placed in this encounter.  Medication Changes: No orders of the defined types were placed in this encounter.   Signed, Richardson Dopp, PA-C  01/07/2020 8:46 AM    [facility] [place], [place], [place]  [address] Phone: [phone]; Fax: [phone]

## 2020-03-12 NOTE — Progress Notes (Deleted)
Cardiology Office Note:    Date:  03/12/2020   ID:  [name], DOB 11-11-1945, MRN [id number]  PCP:  Biagio Borg, MD   [place]  Cardiologist:  Sherren Mocha, MD *** Advanced Practice Provider:  No care team member to display Electrophysiologist:  None  {Press F2 to show EP APP, CHF, sleep or structural heart MD               :[id number]}  { Click here to update then REFRESH NOTE - MD (PCP) or APP (Team Member)  Change PCP Type for MD, Specialty for APP is either Cardiology or Clinical Cardiac Electrophysiology  :[id number]}   Referring MD: Biagio Borg, MD   Chief Complaint:  No chief complaint on file.    Patient Profile:    [name] is a 75 y.o. female with:   Coronary artery disease  ? s/p prior inf MI in 1998 tx with stent to the RCA ? S/p stent LCx in 2012 ? S/p stent to the LAD in 2015 ? S/p DES x 3 to the RCA 2017 ? Myoview 07/2018: low risk ? Cath 03/2019: dRCA diff dz'd supplying small/dz'd PDA+PL br (not ideal for PCI) >> med Rx  Diabetes mellitus   Hypertension  Hyperlipidemia  AAA ? S/p EVAR 01/2016  Prior CV studies: Cardiac catheterization April 16, 2019 LM normal LAD prox stent ok with 30 ISR, dist 75; D2 75 LCx prox 60, mid 60; OM2 stent ok RCA ost stent ok, mid 70, dist 55; RPDA w/ severe dz EF 55-65   Myoview 08/17/2018 Normal perfusion; EF 59; Low Risk  Echocardiogram 02/09/2018 EF 50-55, no RWMA, mod MAC  Echocardiogram 06/13/17 EF 55-60, no RWMA, Gr 1 DD, mod LAE, trivial TR, PASP 20, trivial eff  Myoview 11/2016 EF 50, breast atten, no ischemia, Low Risk  LHC 10/29/15 LM ok LAD prox stent patent with 30 ISR, dist 70, D2 70 LCx mid 60, OM2 stent patent RCA ost 60, ost-prox 50, prox-mid 70, mid 50, RPDA 40 FFR of RCA 0.71 (hemodynamically significant) PCI: 3 x 28 mm Promus, 3 x 16 mm Promus, 3 x 12 mm Promus DES to RCA   Echo 10/29/15 EF 55-60, no RWMA, Gr 1 DD, MAC  Echo 11/16 EF 55-60,  Gr 1 DD  LHC 2/16 LM ok LAD prox stent ok LCx stent ok RCA prox 40 EF 55-60   LHC (06/06/13):  prox LAD 90%, mid LAD stent ok with 40% ISR, small caliber Dx prox 40% mid 70% (inf sub-branch), OM1 stent ok, prox RCA 50% ,mid RCA 40%, EF 65%.  PCI: 3.0 x 22 mm Resolute DES to proximal LAD  Echo (02/2012):  EF 55% to 60%. Wall motion was normal.  History of Present Illness:    Ms. Halberg was admitted with unstable angina in 03/2019.  A Cardiac catheterization demonstrated 3 v coronary artery disease with patent stent in the LAD, OM2 and RCA. There was diffuse disease in the distal RCA supplying a small PDA and PL branch. The vessel is not ideal for PCI and med Rx was continued. She was last seen in 06/2019.  She had not yet started on nitrates and still reported chest pain.  I encouraged her to start Isosorbide.  She was also not taking stating Rx and I restarted Atorvastatin.  She returns for f/u.  ***      Past Medical History:  Diagnosis Date  . AAA (abdominal aortic aneurysm) ([place])  a. s/p stent graft repair 01/2016.  [name] Acute ischemic colitis ([place]) 07/29/2010   Diagnosed June, 2012 characterized by acute lower GI bleeding, spontaneously resolved.   . Acute respiratory infection 06/09/2014  . Acute sinus infection 05/14/2015  . Allergic rhinitis, cause unspecified   . Angioedema of lips 06/03/2014  . Anxiety state, unspecified   . Arthritis of left hip 04/16/2015  . Bilateral hearing loss 07/19/2017  . Chronic LBP   . Coronary artery disease    a. inferior MI 1998 s/p PCI of RCA. b. stenting of Cx 04/2010. c. DES to prox LAD 05/2013. d. DES x 3 in 10/2015. // Myoview 07/2018:  EF 59, normal perfusion, low risk   . Coronary artery disease involving native heart without angina pectoris 07/31/2006   Qualifier: Diagnosis of  By: [name] MD, [name]  ANGIOGRAPHIC DATA:  1. Ventriculography done in the RAO projection reveals a small wall  motion abnormality in the mid inferior wall.  Ejection fraction  would be estimated at around 50%.  2. The right coronary artery has some progressive disease of about 60-  80% in the proximal mid segment. The distal vessel appears to be  50-70% narrowing althou  . Degeneration of lumbar or lumbosacral intervertebral disc   . Depressive disorder, not elsewhere classified   . Diabetes ([place]) 11/03/2006   Qualifier: Diagnosis of  By: [name] MD, [name]   . Diabetes mellitus    TYPE II  . Frequent urination 05/08/2014  . Gout 08/22/2013  . History of endovascular stent graft for abdominal aortic aneurysm (AAA) 01/22/2016  . Hyperlipidemia   . Hypertension   . Hypothyroidism   . Iron deficiency anemia 06/13/2017  . Lower GI bleed 06/2010   Diverticular bleed  . Lumbar radiculopathy 05/15/2015  . MENOPAUSAL DISORDER 10/29/2007   Qualifier: Diagnosis of  By: [name] MD, [name]   . Myalgia 09/25/2013  . Noncompliance with medications 02/26/2014  . Obesity, unspecified   . OTITIS MEDIA, SEROUS, CHRONIC 04/23/2007   Qualifier: Diagnosis of  By: [name] MD, [name]   . Thoracic disc disease 11/19/2018  . URTICARIA 09/23/2009   Qualifier: Diagnosis of  By: [name] MD, [name] Venous insufficiency 07/19/2017    Current Medications: No outpatient medications have been marked as taking for the 03/13/20 encounter (Appointment) with Richardson Dopp T, PA-C.     Allergies:   Crestor [rosuvastatin calcium], Prilosec [omeprazole], Miconazole nitrate, Augmentin [amoxicillin-pot clavulanate], and Doxycycline   Social History   Tobacco Use  . Smoking status: Former Smoker    Years: 30.00    Types: Cigarettes    Quit date: 05/31/1986    Years since quitting: 33.8  . Smokeless tobacco: Never Used  Vaping Use  . Vaping Use: Never used  Substance Use Topics  . Alcohol use: No  . Drug use: No     Family Hx: The patient's family history includes Arthritis in her maternal aunt; Breast cancer in her maternal aunt; Diabetes in her brother and maternal aunt;  Heart attack (age of onset: 10) in her mother; Heart attack (age of onset: 86) in her father; Heart disease in her father and mother. There is no history of Colon cancer.  ROS   EKGs/Labs/Other Test Reviewed:    EKG:  EKG is *** ordered today.  The ekg ordered today demonstrates ***  Recent Labs: 03/18/2019: TSH 4.17 04/04/2019: BUN 10; Creatinine, Ser 0.92; Hemoglobin 12.5; Platelets 236; Potassium 4.0; Sodium 144 12/25/2019: ALT 10  Recent Lipid Panel Lab Results  Component Value Date/Time   CHOL 162 12/25/2019 12:37 PM   TRIG 132 12/25/2019 12:37 PM   TRIG 102 01/02/2006 08:25 AM   HDL 37 (L) 12/25/2019 12:37 PM   CHOLHDL 4.4 12/25/2019 12:37 PM   CHOLHDL 5.8 04/04/2019 04:11 AM   LDLCALC 101 (H) 12/25/2019 12:37 PM   LDLDIRECT 131.0 03/27/2015 11:37 AM      Risk Assessment/Calculations:   {Does this patient have ATRIAL FIBRILLATION?[phone]}  Physical Exam:    VS:  There were no vitals taken for this visit.    Wt Readings from Last 3 Encounters:  12/23/19 218 lb (98.9 kg)  11/21/19 219 lb (99.3 kg)  11/15/19 216 lb (98 kg)     Physical Exam ***  ASSESSMENT & PLAN:    *** 1. Coronary artery disease of native artery of native heart with stable angina pectoris ([place]) Prior history of stenting to the LAD, LCx and RCA.  Cardiac catheterization in March 2021 demonstrated diffuse distal RCA disease leading into a severely diseased small PDA.  Medical therapy has been recommended.  She has occasional substernal chest discomfort that is not very typical for angina.  She has not taken nitroglycerin for this.  She has not tried taking isosorbide.  Her blood pressure is somewhat borderline low.  I have recommended trying to take isosorbide 15 mg daily.  If she has symptomatic hypotension with this or no significant improvement, we can certainly discontinue it.  If her blood pressure limits antianginal therapy, we could consider ranolazine.  Continue aspirin.  Start taking  atorvastatin on a regular basis.  Continue beta-blocker.  Follow-up in 3 months.  2. Essential hypertension Blood pressure is well controlled.  As noted, it is on the low side may limit antianginal therapy.  Continue current dose of atenolol, hydrochlorothiazide.  Start isosorbide as outlined above.  3. Mixed hyperlipidemia She has not been taking atorvastatin on a regular basis.  She is also not taking ezetimibe.  We discussed the importance of aggressive lipid management to reduce her risk of future events.  Start atorvastatin 20 mg daily.  Obtain lipids and LFTs in 8 weeks. {Are you ordering a CV Procedure (e.g. stress test, cath, DCCV, TEE, etc)?   Press F2        :[id number]}    Dispo:  No follow-ups on file.   Medication Adjustments/Labs and Tests Ordered: Current medicines are reviewed at length with the patient today.  Concerns regarding medicines are outlined above.  Tests Ordered: No orders of the defined types were placed in this encounter.  Medication Changes: No orders of the defined types were placed in this encounter.   Signed, Richardson Dopp, PA-C  03/12/2020 4:39 PM    [facility] [place], [place], [place]  [address] Phone: [phone]; Fax: [phone]

## 2020-03-13 ENCOUNTER — Ambulatory Visit: Payer: Medicare HMO | Admitting: Physician Assistant

## 2020-03-13 DIAGNOSIS — I10 Essential (primary) hypertension: Secondary | ICD-10-CM

## 2020-03-13 DIAGNOSIS — E782 Mixed hyperlipidemia: Secondary | ICD-10-CM

## 2020-03-13 DIAGNOSIS — I25118 Atherosclerotic heart disease of native coronary artery with other forms of angina pectoris: Secondary | ICD-10-CM

## 2020-03-13 DIAGNOSIS — I714 Abdominal aortic aneurysm, without rupture: Secondary | ICD-10-CM

## 2020-03-17 ENCOUNTER — Other Ambulatory Visit: Payer: Self-pay

## 2020-03-18 ENCOUNTER — Other Ambulatory Visit: Payer: Self-pay

## 2020-03-18 ENCOUNTER — Ambulatory Visit: Payer: Medicare HMO | Admitting: Internal Medicine

## 2020-03-19 ENCOUNTER — Encounter: Payer: Self-pay | Admitting: Internal Medicine

## 2020-03-19 ENCOUNTER — Other Ambulatory Visit (INDEPENDENT_AMBULATORY_CARE_PROVIDER_SITE_OTHER): Payer: Medicare HMO

## 2020-03-19 ENCOUNTER — Ambulatory Visit (INDEPENDENT_AMBULATORY_CARE_PROVIDER_SITE_OTHER): Payer: Medicare HMO | Admitting: Internal Medicine

## 2020-03-19 VITALS — BP 112/60 | HR 69 | Ht 65.0 in | Wt 224.0 lb

## 2020-03-19 DIAGNOSIS — Z0001 Encounter for general adult medical examination with abnormal findings: Secondary | ICD-10-CM | POA: Diagnosis not present

## 2020-03-19 DIAGNOSIS — E538 Deficiency of other specified B group vitamins: Secondary | ICD-10-CM

## 2020-03-19 DIAGNOSIS — E1165 Type 2 diabetes mellitus with hyperglycemia: Secondary | ICD-10-CM | POA: Diagnosis not present

## 2020-03-19 DIAGNOSIS — E039 Hypothyroidism, unspecified: Secondary | ICD-10-CM | POA: Diagnosis not present

## 2020-03-19 DIAGNOSIS — E782 Mixed hyperlipidemia: Secondary | ICD-10-CM

## 2020-03-19 DIAGNOSIS — E559 Vitamin D deficiency, unspecified: Secondary | ICD-10-CM | POA: Diagnosis not present

## 2020-03-19 DIAGNOSIS — H6521 Chronic serous otitis media, right ear: Secondary | ICD-10-CM

## 2020-03-19 DIAGNOSIS — H6691 Otitis media, unspecified, right ear: Secondary | ICD-10-CM

## 2020-03-19 LAB — URINALYSIS, ROUTINE W REFLEX MICROSCOPIC
Bilirubin Urine: NEGATIVE
Ketones, ur: NEGATIVE
Nitrite: POSITIVE — AB
Specific Gravity, Urine: 1.015 (ref 1.000–1.030)
Total Protein, Urine: NEGATIVE
Urine Glucose: NEGATIVE
Urobilinogen, UA: 2 — AB (ref 0.0–1.0)
pH: 6 (ref 5.0–8.0)

## 2020-03-19 LAB — CBC WITH DIFFERENTIAL/PLATELET
Basophils Absolute: 0 10*3/uL (ref 0.0–0.1)
Basophils Relative: 0.7 % (ref 0.0–3.0)
Eosinophils Absolute: 0.1 10*3/uL (ref 0.0–0.7)
Eosinophils Relative: 1.4 % (ref 0.0–5.0)
HCT: 44.1 % (ref 36.0–46.0)
Hemoglobin: 14.3 g/dL (ref 12.0–15.0)
Lymphocytes Relative: 25.3 % (ref 12.0–46.0)
Lymphs Abs: 1.7 10*3/uL (ref 0.7–4.0)
MCHC: 32.5 g/dL (ref 30.0–36.0)
MCV: 87.4 fl (ref 78.0–100.0)
Monocytes Absolute: 0.5 10*3/uL (ref 0.1–1.0)
Monocytes Relative: 7.5 % (ref 3.0–12.0)
Neutro Abs: 4.4 10*3/uL (ref 1.4–7.7)
Neutrophils Relative %: 65.1 % (ref 43.0–77.0)
Platelets: 297 10*3/uL (ref 150.0–400.0)
RBC: 5.05 Mil/uL (ref 3.87–5.11)
RDW: 14.4 % (ref 11.5–15.5)
WBC: 6.8 10*3/uL (ref 4.0–10.5)

## 2020-03-19 LAB — HEMOGLOBIN A1C: Hgb A1c MFr Bld: 6.3 % (ref 4.6–6.5)

## 2020-03-19 MED ORDER — AZITHROMYCIN 250 MG PO TABS: ORAL_TABLET | ORAL | 1 refills | Status: DC

## 2020-03-19 NOTE — Patient Instructions (Signed)
Please take all new medication as prescribed - the antibiotic  Please continue all other medications as before, and refills have been done if requested.  Please have the pharmacy call with any other refills you may need.  Please continue your efforts at being more active, low cholesterol diet, and weight control.  You are otherwise up to date with prevention measures today.  Please keep your appointments with your specialists as you may have planned  Please go to the LAB at the blood drawing area for the tests to be done - at the [place] will be contacted by phone if any changes need to be made immediately.  Otherwise, you will receive a letter about your results with an explanation, but please check with MyChart first.  Please remember to sign up for MyChart if you have not done so, as this will be important to you in the future with finding out test results, communicating by private email, and scheduling acute appointments online when needed.  Please make an Appointment to return in 6 months, or sooner if needed

## 2020-03-19 NOTE — Progress Notes (Signed)
Patient ID: [name], female   DOB: 11/21/1945, 75 y.o.   MRN: [id number]         Chief Complaint:: wellness exam and Follow-up   Dm and right ear pain       HPI:  [name] is a 75 y.o. female here for wellness exam; has eye appt Monday.  Otherwise up to date with preventive referrals and immunizations.                         Also c/o mod right ear pain x 3 days, occasionally severe, with feverish, HA, generalzied weakness, but no ST, cough and Pt denies chest pain, increased sob or doe, wheezing, orthopnea, PND, increased LE swelling, palpitations, dizziness or syncope.  nothiing else makes better or worse.  .denies new focal neuro s/s.   Pt denies polydipsia, polyuria,  No other new complaints  Wt Readings from Last 3 Encounters:  03/19/20 224 lb (101.6 kg)  12/23/19 218 lb (98.9 kg)  11/21/19 219 lb (99.3 kg)   BP Readings from Last 3 Encounters:  03/19/20 112/60  12/23/19 120/70  11/21/19 122/74   Immunization History  Administered Date(s) Administered  . Fluad Quad(high Dose 65+) 11/15/2019  . Influenza Split 11/02/2010, 10/20/2011  . Influenza Whole 11/23/2005, 10/15/2007, 10/31/2008, 11/11/2009  . Influenza, High Dose Seasonal PF 09/23/2016, 10/19/2017  . Influenza,inj,Quad PF,6+ Mos 10/18/2012, 09/05/2013, 10/30/2015  . Influenza-Unspecified 10/18/2014, 09/11/2018  . PFIZER(Purple Top)SARS-COV-2 Vaccination 03/18/2019, 04/18/2019, 11/05/2019  . Pneumococcal Conjugate-13 10/18/2012, 02/21/2013  . Pneumococcal Polysaccharide-23 11/17/2005, 11/09/2010  . Tetanus 02/21/2013  . Zoster 11/23/2005  . Zoster Recombinat (Shingrix) 09/12/2018, 11/16/2018   Health Maintenance Due  Topic Date Due  . OPHTHALMOLOGY EXAM  08/06/2019      Past Medical History:  Diagnosis Date  . AAA (abdominal aortic aneurysm) ([place])    a. s/p stent graft repair 01/2016.  [name] Acute ischemic colitis ([place]) 07/29/2010   Diagnosed June, 2012 characterized by acute lower GI bleeding,  spontaneously resolved.   . Acute respiratory infection 06/09/2014  . Acute sinus infection 05/14/2015  . Allergic rhinitis, cause unspecified   . Angioedema of lips 06/03/2014  . Anxiety state, unspecified   . Arthritis of left hip 04/16/2015  . Bilateral hearing loss 07/19/2017  . Chronic LBP   . Coronary artery disease    a. inferior MI 1998 s/p PCI of RCA. b. stenting of Cx 04/2010. c. DES to prox LAD 05/2013. d. DES x 3 in 10/2015. // Myoview 07/2018:  EF 59, normal perfusion, low risk   . Coronary artery disease involving native heart without angina pectoris 07/31/2006   Qualifier: Diagnosis of  By: [name] MD, [name]  ANGIOGRAPHIC DATA:  1. Ventriculography done in the RAO projection reveals a small wall  motion abnormality in the mid inferior wall. Ejection fraction  would be estimated at around 50%.  2. The right coronary artery has some progressive disease of about 60-  80% in the proximal mid segment. The distal vessel appears to be  50-70% narrowing althou  . Degeneration of lumbar or lumbosacral intervertebral disc   . Depressive disorder, not elsewhere classified   . Diabetes ([place]) 11/03/2006   Qualifier: Diagnosis of  By: [name] MD, [name]   . Diabetes mellitus    TYPE II  . Frequent urination 05/08/2014  . Gout 08/22/2013  . History of endovascular stent graft for abdominal aortic aneurysm (AAA) 01/22/2016  . Hyperlipidemia   .  Hypertension   . Hypothyroidism   . Iron deficiency anemia 06/13/2017  . Lower GI bleed 06/2010   Diverticular bleed  . Lumbar radiculopathy 05/15/2015  . MENOPAUSAL DISORDER 10/29/2007   Qualifier: Diagnosis of  By: [name] MD, [name]   . Myalgia 09/25/2013  . Noncompliance with medications 02/26/2014  . Obesity, unspecified   . OTITIS MEDIA, SEROUS, CHRONIC 04/23/2007   Qualifier: Diagnosis of  By: [name] MD, [name]   . Thoracic disc disease 11/19/2018  . URTICARIA 09/23/2009   Qualifier: Diagnosis of  By: [name] MD, [name] Venous insufficiency  07/19/2017   Past Surgical History:  Procedure Laterality Date  . CARDIAC CATHETERIZATION     PCI OF BOTH THE CIRCUMFLEX AND LEFT ANTERIOR DESCENDING ARTERY  . CARDIAC CATHETERIZATION N/A 10/29/2015   Procedure: Coronary Stent Intervention;  Surgeon: Nelva Bush, MD;  Location: [facility];  Service: Cardiovascular;  Laterality: N/A;  . CARDIAC CATHETERIZATION N/A 10/29/2015   Procedure: Coronary/Graft Angiography;  Surgeon: Nelva Bush, MD;  Location: [facility];  Service: Cardiovascular;  Laterality: N/A;  . CARDIAC CATHETERIZATION N/A 10/29/2015   Procedure: Intravascular Pressure Wire/FFR Study;  Surgeon: Nelva Bush, MD;  Location: [facility];  Service: Cardiovascular;  Laterality: N/A;  . CESAREAN SECTION    . ENDOVASCULAR STENT INSERTION N/A 01/22/2016   Procedure: ABDOMINAL AORTIC ENDOVASCULAR STENT GRAFT INSERTION;  Surgeon: Rosetta Posner, MD;  Location: [facility];  Service: Vascular;  Laterality: N/A;  . HEART STENT  04-2010  and  Jun 07, 2013   X 3  . LEFT HEART CATH AND CORONARY ANGIOGRAPHY N/A 04/03/2019   Procedure: LEFT HEART CATH AND CORONARY ANGIOGRAPHY;  Surgeon: Nelva Bush, MD;  Location: [facility];  Service: Cardiovascular;  Laterality: N/A;  . LEFT HEART CATHETERIZATION WITH CORONARY ANGIOGRAM N/A 06/07/2013   Procedure: LEFT HEART CATHETERIZATION WITH CORONARY ANGIOGRAM;  Surgeon: [name], MD;  Location[facility];  Service: Cardiovascular;  Laterality: N/A;  . LEFT HEART CATHETERIZATION WITH CORONARY ANGIOGRAM N/A 02/25/2014   Procedure: LEFT HEART CATHETERIZATION WITH CORONARY ANGIOGRAM;  Surgeon: Troy Sine, MD;  Location: [facility] CATH LAB;  Service: Cardiovascular;  Laterality: N/A;  . LUMBAR FUSION  01/2007   DR. Patrice Paradise...3-LEVEL WITH FIXATION  . OOPHORECTOMY     BSO? pt.unsure  . PARATHYROIDECTOMY    . SPINE SURGERY    . THYROIDECTOMY    . TOTAL ABDOMINAL HYSTERECTOMY      reports that she quit smoking about  33 years ago. Her smoking use included cigarettes. She quit after 30.00 years of use. She has never used smokeless tobacco. She reports that she does not drink alcohol and does not use drugs. family history includes Arthritis in her maternal aunt; Breast cancer in her maternal aunt; Diabetes in her brother and maternal aunt; Heart attack (age of onset: 98) in her mother; Heart attack (age of onset: 54) in her father; Heart disease in her father and mother. Allergies  Allergen Reactions  . Crestor [Rosuvastatin Calcium] Other (See Comments)    Feeling poor  . Prilosec [Omeprazole] Other (See Comments)    Chest pain  . Miconazole Nitrate Hives    REACTION: hives  . Augmentin [Amoxicillin-Pot Clavulanate] Hives, Itching and Rash    Has patient had a PCN reaction causing immediate rash, facial/tongue/throat swelling, SOB or lightheadedness with hypotension:unsure Has patient had a PCN reaction causing severe rash involving mucus membranes or skin necrosis:unsure Has patient  had a PCN reaction that required hospitalization:No Has patient had a PCN reaction occurring within the last 10 years:NO If all of the above answers are "NO", then may proceed with Cephalosporin use. Has patient had a PCN reaction causing immediate rash, facial/tongue/throat swelling, SOB or lightheadedness with hypotension:unsure Has patient had a PCN reaction causing severe rash involving mucus membranes or skin necrosis:unsure Has patient had a PCN reaction that required hospitalization:No Has patient had a PCN reaction occurring within the last 10 years:NO If all of the above answers are "NO", then may proceed with Cephalosporin use.   [name] Doxycycline Other (See Comments)    REACTION: gi upset   Current Outpatient Medications on File Prior to Visit  Medication Sig Dispense Refill  . allopurinol (ZYLOPRIM) 300 MG tablet Take 1 tablet (300 mg total) by mouth as needed (for Gout). 90 tablet 3  . aspirin 81 MG EC tablet Take  81 mg by mouth daily.     [name] atenolol (TENORMIN) 25 MG tablet Take 1 tablet (25 mg total) by mouth daily. TAKE 1 TABLET TWICE DAILY 180 tablet 0  . atorvastatin (LIPITOR) 40 MG tablet Take 1 tablet (40 mg total) by mouth daily. 90 tablet 3  . celecoxib (CELEBREX) 200 MG capsule Take 1 capsule (200 mg total) by mouth 2 (two) times daily as needed for moderate pain. 180 capsule 1  . colchicine 0.6 MG tablet Take 1 tablet (0.6 mg total) by mouth daily as needed (gout pain). 30 tablet 2  . cyclobenzaprine (FLEXERIL) 5 MG tablet Take 1 tablet (5 mg total) by mouth 3 (three) times daily as needed for muscle spasms. 30 tablet 2  . docusate sodium (COLACE) 100 MG capsule Take 1 capsule (100 mg total) by mouth 2 (two) times daily as needed for mild constipation. 30 capsule 1  . ezetimibe (ZETIA) 10 MG tablet Take 1 tablet (10 mg total) by mouth daily. 90 tablet 1  . fexofenadine (ALLEGRA) 180 MG tablet Take 1 tablet (180 mg total) by mouth as needed for allergies or rhinitis. 90 tablet 3  . gabapentin (NEURONTIN) 300 MG capsule Take 300 mg by mouth as needed (pain).    . hydrochlorothiazide (MICROZIDE) 12.5 MG capsule Take 1 capsule (12.5 mg total) by mouth daily. 90 capsule 3  . hydrocortisone 1 % lotion Apply 1 application topically 2 (two) times daily. Apply to affected area 96 g 0  . indomethacin (INDOCIN) 50 MG capsule Take 1 capsule (50 mg total) by mouth 3 (three) times daily as needed (for gout attack). 90 capsule 3  . metFORMIN (GLUCOPHAGE-XR) 500 MG 24 hr tablet     . nitroGLYCERIN (NITROSTAT) 0.4 MG SL tablet Place 1 tablet (0.4 mg total) under the tongue every 5 (five) minutes as needed for chest pain. 25 tablet 3  . oxyCODONE (OXY IR/ROXICODONE) 5 MG immediate release tablet 4 (four) times daily as needed.    . pantoprazole (PROTONIX) 40 MG tablet Take 40 mg by mouth as needed (heartburn acid reflux).     . pregabalin (LYRICA) 75 MG capsule Take 75 mg by mouth daily as needed (pain).     .  SYNTHROID 150 MCG tablet TAKE 1 TABLET(150 MCG) BY MOUTH DAILY 90 tablet 1  . tiZANidine (ZANAFLEX) 2 MG tablet Take 1 tablet (2 mg total) by mouth every 6 (six) hours as needed for muscle spasms. 40 tablet 1  . triamcinolone (NASACORT) 55 MCG/ACT AERO nasal inhaler Place 2 sprays into the nose as needed (congestion).  1 each 12  . isosorbide mononitrate (IMDUR) 30 MG 24 hr tablet Take 0.5 tablets (15 mg total) by mouth daily. 45 tablet 3  . predniSONE (DELTASONE) 10 MG tablet 3 tabs by mouth per day for 3 days,2tabs per day for 3 days,1tab per day for 3 days (Patient not taking: Reported on 03/19/2020) 18 tablet 0   No current facility-administered medications on file prior to visit.        ROS:  All others reviewed and negative.  Objective        PE:  BP 112/60   Pulse 69   Ht 5\' 5"  (1.651 m)   Wt 224 lb (101.6 kg)   SpO2 96%   BMI 37.28 kg/m                 Constitutional: Pt appears in NAD               HENT: Head: NCAT.                Right Ear: External ear normal.                 Left Ear: External ear normal.                Eyes: . Pupils are equal, round, and reactive to light. Conjunctivae and EOM are normal; right TM mod erythema and slight bulging               Nose: without d/c or deformity               Neck: Neck supple. Gross normal ROM               Cardiovascular: Normal rate and regular rhythm.                 Pulmonary/Chest: Effort normal and breath sounds without rales or wheezing.                Abd:  Soft, NT, ND, + BS, no organomegaly               Neurological: Pt is alert. At baseline orientation, motor grossly intact               Skin: Skin is warm. No rashes, no other new lesions, LE edema - none               Psychiatric: Pt behavior is normal without agitation   Micro: none  Cardiac tracings I have personally interpreted today:  none  Pertinent Radiological findings (summarize): none   Lab Results  Component Value Date   WBC 6.8 03/19/2020   HGB  14.3 03/19/2020   HCT 44.1 03/19/2020   PLT 297.0 03/19/2020   GLUCOSE 123 (H) 03/19/2020   CHOL 168 03/19/2020   TRIG 112.0 03/19/2020   HDL 30.90 (L) 03/19/2020   LDLDIRECT 131.0 03/27/2015   LDLCALC 114 (H) 03/19/2020   ALT 9 03/19/2020   AST 14 03/19/2020   NA 143 03/19/2020   K 3.8 03/19/2020   CL 104 03/19/2020   CREATININE 1.02 03/19/2020   BUN 9 03/19/2020   CO2 28 03/19/2020   TSH 2.32 03/19/2020   INR 1.08 01/22/2016   HGBA1C 6.3 03/19/2020   MICROALBUR 0.8 03/19/2020   Assessment/Plan:  [name] is a 75 y.o. Black or African American [2] female with  has a past medical history of AAA (abdominal aortic aneurysm) ([place]), Acute ischemic colitis ([place]) (07/29/2010),  Acute respiratory infection (06/09/2014), Acute sinus infection (05/14/2015), Allergic rhinitis, cause unspecified, Angioedema of lips (06/03/2014), Anxiety state, unspecified, Arthritis of left hip (04/16/2015), Bilateral hearing loss (07/19/2017), Chronic LBP, Coronary artery disease, Coronary artery disease involving native heart without angina pectoris (07/31/2006), Degeneration of lumbar or lumbosacral intervertebral disc, Depressive disorder, not elsewhere classified, Diabetes ([place]) (11/03/2006), Diabetes mellitus, Frequent urination (05/08/2014), Gout (08/22/2013), History of endovascular stent graft for abdominal aortic aneurysm (AAA) (01/22/2016), Hyperlipidemia, Hypertension, Hypothyroidism, Iron deficiency anemia (06/13/2017), Lower GI bleed (06/2010), Lumbar radiculopathy (05/15/2015), MENOPAUSAL DISORDER (10/29/2007), Myalgia (09/25/2013), Noncompliance with medications (02/26/2014), Obesity, unspecified, OTITIS MEDIA, SEROUS, CHRONIC (04/23/2007), Thoracic disc disease (11/19/2018), URTICARIA (09/23/2009), and Venous insufficiency (07/19/2017).  Encounter for well adult exam with abnormal findings Age and sex appropriate education and counseling updated with regular exercise and diet Referrals for preventative services - none  needed - ot to call for optho exam Immunizations addressed - none needed Smoking counseling  - none needed Evidence for depression or other mood disorder - none significant Most recent labs reviewed. I have personally reviewed and have noted: 1) the patient's medical and social history 2) The patient's current medications and supplements 3) The patient's height, weight, and BMI have been recorded in the chart   B12 deficiency Lab Results  Component Value Date   VITAMINB12 195 (L) 03/19/2020   Low, to start oral replacement - b12 1000 mcg qd   Diabetes ([place]) Lab Results  Component Value Date   HGBA1C 6.3 03/19/2020   Stable, pt to continue current medical treatment metformin  Current Outpatient Medications (Endocrine & Metabolic):  .  metFORMIN (GLUCOPHAGE-XR) 500 MG 24 hr tablet,  .  SYNTHROID 150 MCG tablet, TAKE 1 TABLET(150 MCG) BY MOUTH DAILY .  predniSONE (DELTASONE) 10 MG tablet, 3 tabs by mouth per day for 3 days,2tabs per day for 3 days,1tab per day for 3 days (Patient not taking: Reported on 03/19/2020)  Current Outpatient Medications (Cardiovascular):  .  atenolol (TENORMIN) 25 MG tablet, Take 1 tablet (25 mg total) by mouth daily. TAKE 1 TABLET TWICE DAILY .  atorvastatin (LIPITOR) 40 MG tablet, Take 1 tablet (40 mg total) by mouth daily. [name]  ezetimibe (ZETIA) 10 MG tablet, Take 1 tablet (10 mg total) by mouth daily. .  hydrochlorothiazide (MICROZIDE) 12.5 MG capsule, Take 1 capsule (12.5 mg total) by mouth daily. .  nitroGLYCERIN (NITROSTAT) 0.4 MG SL tablet, Place 1 tablet (0.4 mg total) under the tongue every 5 (five) minutes as needed for chest pain. .  isosorbide mononitrate (IMDUR) 30 MG 24 hr tablet, Take 0.5 tablets (15 mg total) by mouth daily.  Current Outpatient Medications (Respiratory):  .  fexofenadine (ALLEGRA) 180 MG tablet, Take 1 tablet (180 mg total) by mouth as needed for allergies or rhinitis. [name]  triamcinolone (NASACORT) 55 MCG/ACT AERO nasal  inhaler, Place 2 sprays into the nose as needed (congestion).  Current Outpatient Medications (Analgesics):  .  allopurinol (ZYLOPRIM) 300 MG tablet, Take 1 tablet (300 mg total) by mouth as needed (for Gout). [name]  aspirin 81 MG EC tablet, Take 81 mg by mouth daily.  .  celecoxib (CELEBREX) 200 MG capsule, Take 1 capsule (200 mg total) by mouth 2 (two) times daily as needed for moderate pain. [name]  colchicine 0.6 MG tablet, Take 1 tablet (0.6 mg total) by mouth daily as needed (gout pain). .  indomethacin (INDOCIN) 50 MG capsule, Take 1 capsule (50 mg total) by mouth 3 (three) times daily as needed (for gout attack). [name]  oxyCODONE (OXY IR/ROXICODONE) 5 MG immediate release tablet, 4 (four) times daily as needed.   Current Outpatient Medications (Other):  .  azithromycin (ZITHROMAX) 250 MG tablet, 2 tab by mouth day 1, then 1 per day .  cyclobenzaprine (FLEXERIL) 5 MG tablet, Take 1 tablet (5 mg total) by mouth 3 (three) times daily as needed for muscle spasms. [name]  docusate sodium (COLACE) 100 MG capsule, Take 1 capsule (100 mg total) by mouth 2 (two) times daily as needed for mild constipation. .  gabapentin (NEURONTIN) 300 MG capsule, Take 300 mg by mouth as needed (pain). .  hydrocortisone 1 % lotion, Apply 1 application topically 2 (two) times daily. Apply to affected area .  pantoprazole (PROTONIX) 40 MG tablet, Take 40 mg by mouth as needed (heartburn acid reflux).  .  pregabalin (LYRICA) 75 MG capsule, Take 75 mg by mouth daily as needed (pain).  [name]  tiZANidine (ZANAFLEX) 2 MG tablet, Take 1 tablet (2 mg total) by mouth every 6 (six) hours as needed for muscle spasms.\  Hyperlipidemia Lab Results  Component Value Date   LDLCALC 114 (H) 03/19/2020   Stable, pt to continue current statin liptior 40   Right otitis media Mild to mod, for antibx course,  to f/u any worsening symptoms or concerns  Hypothyroidism Lab Results  Component Value Date   TSH 2.32 03/19/2020   Stable, pt to  continue levothyroxine  Followup: Return in about 6 months (around 09/19/2020).  [name], MD 03/23/2020 12:05 AM [facility]

## 2020-03-20 LAB — HEPATIC FUNCTION PANEL
ALT: 9 U/L (ref 0–35)
AST: 14 U/L (ref 0–37)
Albumin: 3.6 g/dL (ref 3.5–5.2)
Alkaline Phosphatase: 84 U/L (ref 39–117)
Bilirubin, Direct: 0 mg/dL (ref 0.0–0.3)
Total Bilirubin: 0.5 mg/dL (ref 0.2–1.2)
Total Protein: 7.7 g/dL (ref 6.0–8.3)

## 2020-03-20 LAB — LIPID PANEL
Cholesterol: 168 mg/dL (ref 0–200)
HDL: 30.9 mg/dL — ABNORMAL LOW (ref >39.00)
LDL Cholesterol: 114 mg/dL — ABNORMAL HIGH (ref 0–99)
NonHDL: 136.85
Total CHOL/HDL Ratio: 5
Triglycerides: 112 mg/dL (ref 0.0–149.0)
VLDL: 22.4 mg/dL (ref 0.0–40.0)

## 2020-03-20 LAB — BASIC METABOLIC PANEL
BUN: 9 mg/dL (ref 6–23)
CO2: 28 mEq/L (ref 19–32)
Calcium: 8.4 mg/dL (ref 8.4–10.5)
Chloride: 104 mEq/L (ref 96–112)
Creatinine, Ser: 1.02 mg/dL (ref 0.40–1.20)
GFR: 54.17 mL/min — ABNORMAL LOW (ref >60.00)
Glucose, Bld: 123 mg/dL — ABNORMAL HIGH (ref 70–99)
Potassium: 3.8 mEq/L (ref 3.5–5.1)
Sodium: 143 mEq/L (ref 135–145)

## 2020-03-20 LAB — MICROALBUMIN / CREATININE URINE RATIO
Creatinine,U: 156.1 mg/dL
Microalb Creat Ratio: 0.5 mg/g (ref 0.0–30.0)
Microalb, Ur: 0.8 mg/dL (ref 0.0–1.9)

## 2020-03-20 LAB — VITAMIN B12: Vitamin B-12: 195 pg/mL — ABNORMAL LOW (ref 211–911)

## 2020-03-20 LAB — TSH: TSH: 2.32 u[IU]/mL (ref 0.35–4.50)

## 2020-03-20 LAB — VITAMIN D 25 HYDROXY (VIT D DEFICIENCY, FRACTURES): VITD: 8.88 ng/mL — ABNORMAL LOW (ref 30.00–100.00)

## 2020-03-22 NOTE — Progress Notes (Incomplete)
Patient ID: [name], female   DOB: [birth year], 75 y.o.   MRN: [id number]         Chief Complaint:: wellness exam and Follow-up   Dm and right ear pain       HPI:  [name] is a 75 y.o. female here for wellness exam; has eye appt Monday.  Otherwise up to date with preventive referrals and immunizations.                         Also c/o mod right ear pain x 3 days, occasionally severe, with feverish, HA, generalzied weakness, but no ST, cough and Pt denies chest pain, increased sob or doe, wheezing, orthopnea, PND, increased LE swelling, palpitations, dizziness or syncope.  nothiing else makes better or worse.  .denies new focal neuro s/s.   Pt denies polydipsia, polyuria,  No other new complaints  Wt Readings from Last 3 Encounters:  03/19/20 224 lb (101.6 kg)  12/23/19 218 lb (98.9 kg)  11/21/19 219 lb (99.3 kg)   BP Readings from Last 3 Encounters:  03/19/20 112/60  12/23/19 120/70  11/21/19 122/74   Immunization History  Administered Date(s) Administered  . Fluad Quad(high Dose 65+) 11/15/2019  . Influenza Split 11/02/2010, 10/20/2011  . Influenza Whole 11/23/2005, 10/15/2007, 10/31/2008, 11/11/2009  . Influenza, High Dose Seasonal PF 09/23/2016, 10/19/2017  . Influenza,inj,Quad PF,6+ Mos 10/18/2012, 09/05/2013, 10/30/2015  . Influenza-Unspecified 10/18/2014, 09/11/2018  . PFIZER(Purple Top)SARS-COV-2 Vaccination 03/18/2019, 04/18/2019, 11/05/2019  . Pneumococcal Conjugate-13 10/18/2012, 02/21/2013  . Pneumococcal Polysaccharide-23 11/17/2005, 11/09/2010  . Tetanus 02/21/2013  . Zoster 11/23/2005  . Zoster Recombinat (Shingrix) 09/12/2018, 11/16/2018   Health Maintenance Due  Topic Date Due  . OPHTHALMOLOGY EXAM  08/06/2019  . URINE MICROALBUMIN  03/17/2020  . HEMOGLOBIN A1C  03/18/2020      Past Medical History:  Diagnosis Date  . AAA (abdominal aortic aneurysm) ([place])    a. s/p stent graft repair 01/2016.  [name] Acute ischemic colitis ([place]) 07/29/2010    Diagnosed June, 2012 characterized by acute lower GI bleeding, spontaneously resolved.   . Acute respiratory infection 06/09/2014  . Acute sinus infection 05/14/2015  . Allergic rhinitis, cause unspecified   . Angioedema of lips 06/03/2014  . Anxiety state, unspecified   . Arthritis of left hip 04/16/2015  . Bilateral hearing loss 07/19/2017  . Chronic LBP   . Coronary artery disease    a. inferior MI 1998 s/p PCI of RCA. b. stenting of Cx 04/2010. c. DES to prox LAD 05/2013. d. DES x 3 in 10/2015. // Myoview 07/2018:  EF 59, normal perfusion, low risk   . Coronary artery disease involving native heart without angina pectoris 07/31/2006   Qualifier: Diagnosis of  By: [name] MD, [name]  ANGIOGRAPHIC DATA:  1. Ventriculography done in the RAO projection reveals a small wall  motion abnormality in the mid inferior wall. Ejection fraction  would be estimated at around 50%.  2. The right coronary artery has some progressive disease of about 60-  80% in the proximal mid segment. The distal vessel appears to be  50-70% narrowing althou  . Degeneration of lumbar or lumbosacral intervertebral disc   . Depressive disorder, not elsewhere classified   . Diabetes ([place]) 11/03/2006   Qualifier: Diagnosis of  By: [name] MD, [name]   . Diabetes mellitus    TYPE II  . Frequent urination 05/08/2014  . Gout 08/22/2013  . History of endovascular stent graft  for abdominal aortic aneurysm (AAA) 01/22/2016  . Hyperlipidemia   . Hypertension   . Hypothyroidism   . Iron deficiency anemia 06/13/2017  . Lower GI bleed 06/2010   Diverticular bleed  . Lumbar radiculopathy 05/15/2015  . MENOPAUSAL DISORDER 10/29/2007   Qualifier: Diagnosis of  By: [name] MD, [name]   . Myalgia 09/25/2013  . Noncompliance with medications 02/26/2014  . Obesity, unspecified   . OTITIS MEDIA, SEROUS, CHRONIC 04/23/2007   Qualifier: Diagnosis of  By: [name] MD, [name]   . Thoracic disc disease 11/19/2018  . URTICARIA 09/23/2009   Qualifier: Diagnosis  of  By: [name] MD, [name] Venous insufficiency 07/19/2017   Past Surgical History:  Procedure Laterality Date  . CARDIAC CATHETERIZATION     PCI OF BOTH THE CIRCUMFLEX AND LEFT ANTERIOR DESCENDING ARTERY  . CARDIAC CATHETERIZATION N/A 10/29/2015   Procedure: Coronary Stent Intervention;  Surgeon: Nelva Bush, MD;  Location: [place] [facility];  Service: Cardiovascular;  Laterality: N/A;  . CARDIAC CATHETERIZATION N/A 10/29/2015   Procedure: Coronary/Graft Angiography;  Surgeon: Nelva Bush, MD;  Location: [place] [facility];  Service: Cardiovascular;  Laterality: N/A;  . CARDIAC CATHETERIZATION N/A 10/29/2015   Procedure: Intravascular Pressure Wire/FFR Study;  Surgeon: Nelva Bush, MD;  Location: [place] [facility];  Service: Cardiovascular;  Laterality: N/A;  . CESAREAN SECTION    . ENDOVASCULAR STENT INSERTION N/A 01/22/2016   Procedure: ABDOMINAL AORTIC ENDOVASCULAR STENT GRAFT INSERTION;  Surgeon: Rosetta Posner, MD;  Location: [place];  Service: Vascular;  Laterality: N/A;  . HEART STENT  04-2010  and  Jun 07, 2013   X 3  . LEFT HEART CATH AND CORONARY ANGIOGRAPHY N/A 04/03/2019   Procedure: LEFT HEART CATH AND CORONARY ANGIOGRAPHY;  Surgeon: Nelva Bush, MD;  Location: [place] [facility];  Service: Cardiovascular;  Laterality: N/A;  . LEFT HEART CATHETERIZATION WITH CORONARY ANGIOGRAM N/A 06/07/2013   Procedure: LEFT HEART CATHETERIZATION WITH CORONARY ANGIOGRAM;  Surgeon: [name], MD;  Location: [facility] C[facility];  Service: Cardiovascular;  Laterality: N/A;  . LEFT HEART CATHETERIZATION WITH CORONARY ANGIOGRAM N/A 02/25/2014   Procedure: LEFT HEART CATHETERIZATION WITH CORONARY ANGIOGRAM;  Surgeon: Troy Sine, MD;  Location: [facility] CATH LAB;  Service: Cardiovascular;  Laterality: N/A;  . LUMBAR FUSION  01/2007   DR. Patrice Paradise...3-LEVEL WITH FIXATION  . OOPHORECTOMY     BSO? pt.unsure  . PARATHYROIDECTOMY    . SPINE SURGERY    . THYROIDECTOMY    . TOTAL  ABDOMINAL HYSTERECTOMY      reports that she quit smoking about 33 years ago. Her smoking use included cigarettes. She quit after 30.00 years of use. She has never used smokeless tobacco. She reports that she does not drink alcohol and does not use drugs. family history includes Arthritis in her maternal aunt; Breast cancer in her maternal aunt; Diabetes in her brother and maternal aunt; Heart attack (age of onset: 59) in her mother; Heart attack (age of onset: 68) in her father; Heart disease in her father and mother. Allergies  Allergen Reactions  . Crestor [Rosuvastatin Calcium] Other (See Comments)    Feeling poor  . Prilosec [Omeprazole] Other (See Comments)    Chest pain  . Miconazole Nitrate Hives    REACTION: hives  . Augmentin [Amoxicillin-Pot Clavulanate] Hives, Itching and Rash    Has patient had a PCN reaction causing immediate rash, facial/tongue/throat swelling, SOB or lightheadedness with hypotension:unsure Has patient had a PCN  reaction causing severe rash involving mucus membranes or skin necrosis:unsure Has patient had a PCN reaction that required hospitalization:No Has patient had a PCN reaction occurring within the last 10 years:NO If all of the above answers are "NO", then may proceed with Cephalosporin use. Has patient had a PCN reaction causing immediate rash, facial/tongue/throat swelling, SOB or lightheadedness with hypotension:unsure Has patient had a PCN reaction causing severe rash involving mucus membranes or skin necrosis:unsure Has patient had a PCN reaction that required hospitalization:No Has patient had a PCN reaction occurring within the last 10 years:NO If all of the above answers are "NO", then may proceed with Cephalosporin use.   [name] Doxycycline Other (See Comments)    REACTION: gi upset   Current Outpatient Medications on File Prior to Visit  Medication Sig Dispense Refill  . allopurinol (ZYLOPRIM) 300 MG tablet Take 1 tablet (300 mg total) by mouth  as needed (for Gout). 90 tablet 3  . aspirin 81 MG EC tablet Take 81 mg by mouth daily.     [name] atenolol (TENORMIN) 25 MG tablet Take 1 tablet (25 mg total) by mouth daily. TAKE 1 TABLET TWICE DAILY 180 tablet 0  . atorvastatin (LIPITOR) 40 MG tablet Take 1 tablet (40 mg total) by mouth daily. 90 tablet 3  . celecoxib (CELEBREX) 200 MG capsule Take 1 capsule (200 mg total) by mouth 2 (two) times daily as needed for moderate pain. 180 capsule 1  . colchicine 0.6 MG tablet Take 1 tablet (0.6 mg total) by mouth daily as needed (gout pain). 30 tablet 2  . cyclobenzaprine (FLEXERIL) 5 MG tablet Take 1 tablet (5 mg total) by mouth 3 (three) times daily as needed for muscle spasms. 30 tablet 2  . docusate sodium (COLACE) 100 MG capsule Take 1 capsule (100 mg total) by mouth 2 (two) times daily as needed for mild constipation. 30 capsule 1  . ezetimibe (ZETIA) 10 MG tablet Take 1 tablet (10 mg total) by mouth daily. 90 tablet 1  . fexofenadine (ALLEGRA) 180 MG tablet Take 1 tablet (180 mg total) by mouth as needed for allergies or rhinitis. 90 tablet 3  . gabapentin (NEURONTIN) 300 MG capsule Take 300 mg by mouth as needed (pain).    . hydrochlorothiazide (MICROZIDE) 12.5 MG capsule Take 1 capsule (12.5 mg total) by mouth daily. 90 capsule 3  . hydrocortisone 1 % lotion Apply 1 application topically 2 (two) times daily. Apply to affected area 96 g 0  . indomethacin (INDOCIN) 50 MG capsule Take 1 capsule (50 mg total) by mouth 3 (three) times daily as needed (for gout attack). 90 capsule 3  . metFORMIN (GLUCOPHAGE-XR) 500 MG 24 hr tablet     . nitroGLYCERIN (NITROSTAT) 0.4 MG SL tablet Place 1 tablet (0.4 mg total) under the tongue every 5 (five) minutes as needed for chest pain. 25 tablet 3  . oxyCODONE (OXY IR/ROXICODONE) 5 MG immediate release tablet 4 (four) times daily as needed.    . pantoprazole (PROTONIX) 40 MG tablet Take 40 mg by mouth as needed (heartburn acid reflux).     . pregabalin (LYRICA) 75  MG capsule Take 75 mg by mouth daily as needed (pain).     . SYNTHROID 150 MCG tablet TAKE 1 TABLET(150 MCG) BY MOUTH DAILY 90 tablet 1  . tiZANidine (ZANAFLEX) 2 MG tablet Take 1 tablet (2 mg total) by mouth every 6 (six) hours as needed for muscle spasms. 40 tablet 1  . triamcinolone (NASACORT) 55 MCG/ACT  AERO nasal inhaler Place 2 sprays into the nose as needed (congestion). 1 each 12  . isosorbide mononitrate (IMDUR) 30 MG 24 hr tablet Take 0.5 tablets (15 mg total) by mouth daily. 45 tablet 3  . predniSONE (DELTASONE) 10 MG tablet 3 tabs by mouth per day for 3 days,2tabs per day for 3 days,1tab per day for 3 days (Patient not taking: Reported on 03/19/2020) 18 tablet 0   No current facility-administered medications on file prior to visit.        ROS:  All others reviewed and negative.  Objective        PE:  BP 112/60   Pulse 69   Ht 5\' 5"  (1.651 m)   Wt 224 lb (101.6 kg)   SpO2 96%   BMI 37.28 kg/m                 Constitutional: Pt appears in NAD               HENT: Head: NCAT.                Right Ear: External ear normal.                 Left Ear: External ear normal.                Eyes: . Pupils are equal, round, and reactive to light. Conjunctivae and EOM are normal; right TM mod erythema and slight bulging               Nose: without d/c or deformity               Neck: Neck supple. Gross normal ROM               Cardiovascular: Normal rate and regular rhythm.                 Pulmonary/Chest: Effort normal and breath sounds without rales or wheezing.                Abd:  Soft, NT, ND, + BS, no organomegaly               Neurological: Pt is alert. At baseline orientation, motor grossly intact               Skin: Skin is warm. No rashes, no other new lesions, LE edema - none               Psychiatric: Pt behavior is normal without agitation   Micro: none  Cardiac tracings I have personally interpreted today:  none  Pertinent Radiological findings (summarize): none    Lab Results  Component Value Date   WBC 7.3 04/04/2019   HGB 12.5 04/04/2019   HCT 39.9 04/04/2019   PLT 236 04/04/2019   GLUCOSE 124 (H) 04/04/2019   CHOL 162 12/25/2019   TRIG 132 12/25/2019   HDL 37 (L) 12/25/2019   LDLDIRECT 131.0 03/27/2015   LDLCALC 101 (H) 12/25/2019   ALT 10 12/25/2019   AST 15 12/25/2019   NA 144 04/04/2019   K 4.0 04/04/2019   CL 108 04/04/2019   CREATININE 0.92 04/04/2019   BUN 10 04/04/2019   CO2 26 04/04/2019   TSH 4.17 03/18/2019   INR 1.08 01/22/2016   HGBA1C 5.9 (A) 09/19/2019   MICROALBUR 2.0 (H) 03/18/2019   Assessment/Plan:  [name] is a 75 y.o. Black or African American [2] female with  has a  past medical history of AAA (abdominal aortic aneurysm) ([place]), Acute ischemic colitis ([place]) (07/29/2010), Acute respiratory infection (06/09/2014), Acute sinus infection (05/14/2015), Allergic rhinitis, cause unspecified, Angioedema of lips (06/03/2014), Anxiety state, unspecified, Arthritis of left hip (04/16/2015), Bilateral hearing loss (07/19/2017), Chronic LBP, Coronary artery disease, Coronary artery disease involving native heart without angina pectoris (07/31/2006), Degeneration of lumbar or lumbosacral intervertebral disc, Depressive disorder, not elsewhere classified, Diabetes ([place]) (11/03/2006), Diabetes mellitus, Frequent urination (05/08/2014), Gout (08/22/2013), History of endovascular stent graft for abdominal aortic aneurysm (AAA) (01/22/2016), Hyperlipidemia, Hypertension, Hypothyroidism, Iron deficiency anemia (06/13/2017), Lower GI bleed (06/2010), Lumbar radiculopathy (05/15/2015), MENOPAUSAL DISORDER (10/29/2007), Myalgia (09/25/2013), Noncompliance with medications (02/26/2014), Obesity, unspecified, OTITIS MEDIA, SEROUS, CHRONIC (04/23/2007), Thoracic disc disease (11/19/2018), URTICARIA (09/23/2009), and Venous insufficiency (07/19/2017).  No problem-specific Assessment & Plan notes found for this encounter.  Followup: No follow-ups on file.  [name], MD 03/19/2020 4:25 PM [facility]

## 2020-03-23 ENCOUNTER — Other Ambulatory Visit: Payer: Self-pay | Admitting: Internal Medicine

## 2020-03-23 ENCOUNTER — Encounter: Payer: Self-pay | Admitting: Internal Medicine

## 2020-03-23 DIAGNOSIS — H04123 Dry eye syndrome of bilateral lacrimal glands: Secondary | ICD-10-CM | POA: Diagnosis not present

## 2020-03-23 DIAGNOSIS — H524 Presbyopia: Secondary | ICD-10-CM | POA: Diagnosis not present

## 2020-03-23 DIAGNOSIS — Z961 Presence of intraocular lens: Secondary | ICD-10-CM | POA: Diagnosis not present

## 2020-03-23 DIAGNOSIS — Z7984 Long term (current) use of oral hypoglycemic drugs: Secondary | ICD-10-CM | POA: Diagnosis not present

## 2020-03-23 DIAGNOSIS — E119 Type 2 diabetes mellitus without complications: Secondary | ICD-10-CM | POA: Diagnosis not present

## 2020-03-23 LAB — HM DIABETES EYE EXAM

## 2020-03-23 MED ORDER — VITAMIN B-12 1000 MCG PO TABS: 1000.0000 ug | ORAL_TABLET | Freq: Every day | ORAL | 3 refills | Status: DC

## 2020-03-23 MED ORDER — THERA-D 2000 50 MCG (2000 UT) PO TABS: ORAL_TABLET | ORAL | 99 refills | Status: DC

## 2020-03-23 NOTE — Assessment & Plan Note (Signed)
Lab Results  Component Value Date   LDLCALC 114 (H) 03/19/2020   Stable, pt to continue current statin liptior 40

## 2020-03-23 NOTE — Assessment & Plan Note (Signed)
Lab Results  °Component Value Date  ° VITAMINB12 195 (L) 03/19/2020  ° °Low, to start oral replacement - b12 1000 mcg qd ° °

## 2020-03-23 NOTE — Assessment & Plan Note (Signed)
Mild to mod, for antibx course,  to f/u any worsening symptoms or concerns 

## 2020-03-23 NOTE — Assessment & Plan Note (Signed)
Lab Results  Component Value Date   HGBA1C 6.3 03/19/2020   Stable, pt to continue current medical treatment metformin  Current Outpatient Medications (Endocrine & Metabolic):  .  metFORMIN (GLUCOPHAGE-XR) 500 MG 24 hr tablet,  .  SYNTHROID 150 MCG tablet, TAKE 1 TABLET(150 MCG) BY MOUTH DAILY .  predniSONE (DELTASONE) 10 MG tablet, 3 tabs by mouth per day for 3 days,2tabs per day for 3 days,1tab per day for 3 days (Patient not taking: Reported on 03/19/2020)  Current Outpatient Medications (Cardiovascular):  .  atenolol (TENORMIN) 25 MG tablet, Take 1 tablet (25 mg total) by mouth daily. TAKE 1 TABLET TWICE DAILY .  atorvastatin (LIPITOR) 40 MG tablet, Take 1 tablet (40 mg total) by mouth daily. [name]  ezetimibe (ZETIA) 10 MG tablet, Take 1 tablet (10 mg total) by mouth daily. .  hydrochlorothiazide (MICROZIDE) 12.5 MG capsule, Take 1 capsule (12.5 mg total) by mouth daily. .  nitroGLYCERIN (NITROSTAT) 0.4 MG SL tablet, Place 1 tablet (0.4 mg total) under the tongue every 5 (five) minutes as needed for chest pain. .  isosorbide mononitrate (IMDUR) 30 MG 24 hr tablet, Take 0.5 tablets (15 mg total) by mouth daily.  Current Outpatient Medications (Respiratory):  .  fexofenadine (ALLEGRA) 180 MG tablet, Take 1 tablet (180 mg total) by mouth as needed for allergies or rhinitis. [name]  triamcinolone (NASACORT) 55 MCG/ACT AERO nasal inhaler, Place 2 sprays into the nose as needed (congestion).  Current Outpatient Medications (Analgesics):  .  allopurinol (ZYLOPRIM) 300 MG tablet, Take 1 tablet (300 mg total) by mouth as needed (for Gout). [name]  aspirin 81 MG EC tablet, Take 81 mg by mouth daily.  .  celecoxib (CELEBREX) 200 MG capsule, Take 1 capsule (200 mg total) by mouth 2 (two) times daily as needed for moderate pain. [name]  colchicine 0.6 MG tablet, Take 1 tablet (0.6 mg total) by mouth daily as needed (gout pain). .  indomethacin (INDOCIN) 50 MG capsule, Take 1 capsule (50 mg total) by mouth 3 (three)  times daily as needed (for gout attack). [name]  oxyCODONE (OXY IR/ROXICODONE) 5 MG immediate release tablet, 4 (four) times daily as needed.   Current Outpatient Medications (Other):  .  azithromycin (ZITHROMAX) 250 MG tablet, 2 tab by mouth day 1, then 1 per day .  cyclobenzaprine (FLEXERIL) 5 MG tablet, Take 1 tablet (5 mg total) by mouth 3 (three) times daily as needed for muscle spasms. [name]  docusate sodium (COLACE) 100 MG capsule, Take 1 capsule (100 mg total) by mouth 2 (two) times daily as needed for mild constipation. .  gabapentin (NEURONTIN) 300 MG capsule, Take 300 mg by mouth as needed (pain). .  hydrocortisone 1 % lotion, Apply 1 application topically 2 (two) times daily. Apply to affected area .  pantoprazole (PROTONIX) 40 MG tablet, Take 40 mg by mouth as needed (heartburn acid reflux).  .  pregabalin (LYRICA) 75 MG capsule, Take 75 mg by mouth daily as needed (pain).  [name]  tiZANidine (ZANAFLEX) 2 MG tablet, Take 1 tablet (2 mg total) by mouth every 6 (six) hours as needed for muscle spasms.\

## 2020-03-23 NOTE — Assessment & Plan Note (Signed)
Age and sex appropriate education and counseling updated with regular exercise and diet Referrals for preventative services - none needed - ot to call for optho exam Immunizations addressed - none needed Smoking counseling  - none needed Evidence for depression or other mood disorder - none significant Most recent labs reviewed. I have personally reviewed and have noted: 1) the patient's medical and social history 2) The patient's current medications and supplements 3) The patient's height, weight, and BMI have been recorded in the chart

## 2020-03-23 NOTE — Assessment & Plan Note (Signed)
Lab Results  Component Value Date   TSH 2.32 03/19/2020   Stable, pt to continue levothyroxine

## 2020-03-24 ENCOUNTER — Encounter: Payer: Self-pay | Admitting: Internal Medicine

## 2020-03-25 ENCOUNTER — Ambulatory Visit: Payer: Medicare HMO

## 2020-03-25 DIAGNOSIS — E538 Deficiency of other specified B group vitamins: Secondary | ICD-10-CM

## 2020-03-26 ENCOUNTER — Ambulatory Visit (INDEPENDENT_AMBULATORY_CARE_PROVIDER_SITE_OTHER): Payer: Medicare HMO

## 2020-03-26 ENCOUNTER — Other Ambulatory Visit: Payer: Medicare HMO | Admitting: *Deleted

## 2020-03-26 ENCOUNTER — Other Ambulatory Visit: Payer: Self-pay

## 2020-03-26 DIAGNOSIS — I25118 Atherosclerotic heart disease of native coronary artery with other forms of angina pectoris: Secondary | ICD-10-CM

## 2020-03-26 DIAGNOSIS — E782 Mixed hyperlipidemia: Secondary | ICD-10-CM

## 2020-03-26 DIAGNOSIS — E538 Deficiency of other specified B group vitamins: Secondary | ICD-10-CM

## 2020-03-26 MED ORDER — CYANOCOBALAMIN 1000 MCG/ML IJ SOLN
1000.0000 ug | Freq: Once | INTRAMUSCULAR | Status: AC
  Administered 2020-03-26: 1000 ug via INTRAMUSCULAR

## 2020-03-26 NOTE — Progress Notes (Signed)
B12 Given 

## 2020-03-27 LAB — LIPID PANEL
Chol/HDL Ratio: 5.4 ratio — ABNORMAL HIGH (ref 0.0–4.4)
Cholesterol, Total: 177 mg/dL (ref 100–199)
HDL: 33 mg/dL — ABNORMAL LOW (ref >39)
LDL Chol Calc (NIH): 123 mg/dL — ABNORMAL HIGH (ref 0–99)
Triglycerides: 115 mg/dL (ref 0–149)
VLDL Cholesterol Cal: 21 mg/dL (ref 5–40)

## 2020-03-27 LAB — HEPATIC FUNCTION PANEL
ALT: 11 IU/L (ref 0–32)
AST: 13 IU/L (ref 0–40)
Albumin: 4 g/dL (ref 3.7–4.7)
Alkaline Phosphatase: 110 IU/L (ref 44–121)
Bilirubin Total: 0.6 mg/dL (ref 0.0–1.2)
Bilirubin, Direct: 0.19 mg/dL (ref 0.00–0.40)
Total Protein: 7.8 g/dL (ref 6.0–8.5)

## 2020-03-27 NOTE — Progress Notes (Signed)
I, Wendy Poet, LAT, ATC, am serving as scribe for Dr. Lynne Leader.  [name] is a 75 y.o. female who presents to [place] at [facility] today for f/u chronic bilateral knee pain due to severe DJD. Pt was last seen by Dr. Georgina Snell on 11/21/19 for R index finger pain/swelling and bilat knee pain. At last visit, pt was given bilat knee steroid injections and advised to start colchicine. Today, pt reports increasing B knee pain (L>R) since sometime in January 2022.  She denies any knee swelling but is having popping in her knees when transitioning from sit-to-stand.  Dx imaging: 11/21/19 R & L knee XR and R index finger XR  Pertinent review of systems: No fevers or chills  Relevant historical information: Peripheral vascular disease   Exam:  BP 102/66 (BP Location: Left Arm, Patient Position: Sitting, Cuff Size: Large)   Ht 5\' 5"  (1.651 m)   Wt 218 lb 12.8 oz (99.2 kg)   BMI 36.41 kg/m  General: Well Developed, well nourished, and in no acute distress.   MSK: Right knee mild effusion normal motion with crepitation.  Left knee normal appearing with effusion.  Normal motion with crepitation.     Lab and Radiology Results  EXAM: LEFT KNEE 3 VIEWS; RIGHT KNEE 3 VIEWS  COMPARISON:  None.  FINDINGS: No fracture or dislocation of the bilateral knees. There is severe, symmetric bilateral tricompartmental arthrosis of the knees, with near total bone-on-bone joint space loss, most severe in the patellofemoral compartments bilaterally., of note, there are multiple calcified loose bodies within the superior joint space of the left knee and within the posterior joint spaces of the bilateral knees. No knee joint effusion. Vascular calcinosis.  IMPRESSION: 1.  No fracture or dislocation of the bilateral knees.  2. There is severe, symmetric bilateral tricompartmental arthrosis of the knees, with near total bone-on-bone joint space loss, most severe in the  patellofemoral compartments bilaterally.  3. There are multiple calcified loose bodies within the superior joint space of the left knee and within the posterior joint spaces of the bilateral knees.   Electronically Signed   By: Eddie Candle M.D.   On: 11/23/2019 15:50  I, Lynne Leader, personally (independently) visualized and performed the interpretation of the images attached in this note.  Procedure: Real-time Ultrasound Guided Injection of right knee superior lateral patellar space Device: Philips Affiniti 50G Images permanently stored and available for review in PACS Verbal informed consent obtained.  Discussed risks and benefits of procedure. Warned about infection bleeding damage to structures skin hypopigmentation and fat atrophy among others. Patient expresses understanding and agreement Time-out conducted.   Noted no overlying erythema, induration, or other signs of local infection.   Skin prepped in a sterile fashion.   Local anesthesia: Topical Ethyl chloride.   With sterile technique and under real time ultrasound guidance:  40 mg of Kenalog and 2 mL of Marcaine injected into joint capsule. Fluid seen entering the joint.   Completed without difficulty   Pain immediately resolved suggesting accurate placement of the medication.   Advised to call if fevers/chills, erythema, induration, drainage, or persistent bleeding.   Images permanently stored and available for review in the ultrasound unit.  Impression: Technically successful ultrasound guided injection.   Procedure: Real-time Ultrasound Guided Injection of left knee superior lateral patellar space Device: Philips Affiniti 50G Images permanently stored and available for review in PACS Verbal informed consent obtained.  Discussed risks and benefits of procedure. Warned  about infection bleeding damage to structures skin hypopigmentation and fat atrophy among others. Patient expresses understanding and  agreement Time-out conducted.   Noted no overlying erythema, induration, or other signs of local infection.   Skin prepped in a sterile fashion.   Local anesthesia: Topical Ethyl chloride.   With sterile technique and under real time ultrasound guidance:  40 mg of Kenalog and 2 mL of Marcaine injected into joint capsule. Fluid seen entering the joint.   Completed without difficulty   Pain immediately resolved suggesting accurate placement of the medication.   Advised to call if fevers/chills, erythema, induration, drainage, or persistent bleeding.   Images permanently stored and available for review in the ultrasound unit.  Impression: Technically successful ultrasound guided injection.         Assessment and Plan: 75 y.o. female with bilateral knee pain due to end-stage DJD.  Plan for repeat steroid injection as last series of injections lasted at least 3 months.  Continue conservative management strategies including quad strengthening and trial of weight loss.  Recheck in about 3 months.  Repeat steroid injection at that time if needed.  Ultimately patient will likely require total knee replacement.   PDMP not reviewed this encounter. Orders Placed This Encounter  Procedures  . [place] LIMITED JOINT SPACE STRUCTURES LOW BILAT(NO LINKED CHARGES)    Order Specific Question:   Reason for Exam (SYMPTOM  OR DIAGNOSIS REQUIRED)    Answer:   B knee pain    Order Specific Question:   Preferred imaging location?    Answer:   [place]   No orders of the defined types were placed in this encounter.    Discussed warning signs or symptoms. Please see discharge instructions. Patient expresses understanding.   The above documentation has been reviewed and is accurate and complete Lynne Leader, M.D.

## 2020-03-30 ENCOUNTER — Ambulatory Visit (INDEPENDENT_AMBULATORY_CARE_PROVIDER_SITE_OTHER): Payer: Medicare HMO | Admitting: Family Medicine

## 2020-03-30 ENCOUNTER — Ambulatory Visit: Payer: Self-pay

## 2020-03-30 ENCOUNTER — Encounter: Payer: Self-pay | Admitting: Family Medicine

## 2020-03-30 ENCOUNTER — Other Ambulatory Visit: Payer: Self-pay

## 2020-03-30 VITALS — BP 102/66 | Ht 65.0 in | Wt 218.8 lb

## 2020-03-30 DIAGNOSIS — M25562 Pain in left knee: Secondary | ICD-10-CM

## 2020-03-30 DIAGNOSIS — M25561 Pain in right knee: Secondary | ICD-10-CM

## 2020-03-30 DIAGNOSIS — G8929 Other chronic pain: Secondary | ICD-10-CM | POA: Diagnosis not present

## 2020-03-30 NOTE — Patient Instructions (Addendum)
Thanks for coming in today.  You had B knee injections today.  Call or go to the ER if you develop a large red swollen joint with extreme pain or oozing puss.   Recheck in 3 months.   Return sooner if needed.

## 2020-04-02 ENCOUNTER — Telehealth: Payer: Self-pay

## 2020-04-02 NOTE — Telephone Encounter (Signed)
Called patient to see when she would be coming by the office to fill out paperwork for Inclisiran.  She said she forgot about paperwork but would try to come by tomorrow 04/03/20.

## 2020-04-02 NOTE — Telephone Encounter (Signed)
-----   Message from Jeanann Lewandowsky, [place] sent at 04/02/2020 11:05 AM EDT ----- Andee Poles is off today so sending to triage to handle.

## 2020-04-07 NOTE — Telephone Encounter (Signed)
Leqvio benefit investigation form has been faxed to [employer].

## 2020-04-08 NOTE — Telephone Encounter (Signed)
Received response regarding Leqvio copay. Patient's insurance covers Leqvio at 20% copay until she reaches her $3900 deductible (she has only paid 36 cents towards this so far). 20% copay would likely be ~$600 per injection which is likely cost prohibitive.  Pt currently has atorvastatin 40mg  and ezetimibe 10mg  on her med list, however her fill records indicate that she has not filled either of these meds in the last 6 months. Would emphasize adherence to both meds as they should bring her LDL to goal and will be more affordable than Leqvio.

## 2020-04-15 DIAGNOSIS — M7918 Myalgia, other site: Secondary | ICD-10-CM | POA: Diagnosis not present

## 2020-04-15 DIAGNOSIS — M4010 Other secondary kyphosis, site unspecified: Secondary | ICD-10-CM | POA: Diagnosis not present

## 2020-04-15 DIAGNOSIS — M4325 Fusion of spine, thoracolumbar region: Secondary | ICD-10-CM | POA: Diagnosis not present

## 2020-04-15 DIAGNOSIS — M546 Pain in thoracic spine: Secondary | ICD-10-CM | POA: Diagnosis not present

## 2020-04-15 DIAGNOSIS — M1712 Unilateral primary osteoarthritis, left knee: Secondary | ICD-10-CM | POA: Diagnosis not present

## 2020-04-15 DIAGNOSIS — M1711 Unilateral primary osteoarthritis, right knee: Secondary | ICD-10-CM | POA: Diagnosis not present

## 2020-04-15 DIAGNOSIS — G894 Chronic pain syndrome: Secondary | ICD-10-CM | POA: Diagnosis not present

## 2020-04-27 ENCOUNTER — Other Ambulatory Visit: Payer: Self-pay | Admitting: Orthopaedic Surgery

## 2020-04-27 DIAGNOSIS — M4325 Fusion of spine, thoracolumbar region: Secondary | ICD-10-CM

## 2020-04-29 ENCOUNTER — Encounter: Payer: Self-pay | Admitting: Physician Assistant

## 2020-04-29 ENCOUNTER — Other Ambulatory Visit: Payer: Self-pay

## 2020-04-29 ENCOUNTER — Ambulatory Visit (INDEPENDENT_AMBULATORY_CARE_PROVIDER_SITE_OTHER): Payer: Medicare HMO | Admitting: Physician Assistant

## 2020-04-29 VITALS — BP 112/74 | HR 81 | Ht 66.0 in | Wt 220.0 lb

## 2020-04-29 DIAGNOSIS — I10 Essential (primary) hypertension: Secondary | ICD-10-CM | POA: Diagnosis not present

## 2020-04-29 DIAGNOSIS — I25118 Atherosclerotic heart disease of native coronary artery with other forms of angina pectoris: Secondary | ICD-10-CM

## 2020-04-29 DIAGNOSIS — I714 Abdominal aortic aneurysm, without rupture, unspecified: Secondary | ICD-10-CM

## 2020-04-29 DIAGNOSIS — E782 Mixed hyperlipidemia: Secondary | ICD-10-CM

## 2020-04-29 NOTE — Progress Notes (Signed)
Cardiology Office Note:    Date:  04/29/2020   ID:  [name], DOB 11/06/1945, MRN [id number]  PCP:  Biagio Borg, MD   [place]  Cardiologist:  Sherren Mocha, MD  Advanced Practice Provider:  Liliane Shi, PA-C Electrophysiologist:  None       Referring MD: Biagio Borg, MD   Chief Complaint:  Follow-up (CAD)    Patient Profile:     [name] is a 75 y.o. female with:   Coronary artery disease  ? s/p prior inf MI in 1998 tx with stent to the RCA ? S/p stent LCx in 2012 ? S/p stent to the LAD in 2015 ? S/p DES x 3 to the RCA 2017 ? Myoview 07/2018: low risk ? Cath 03/2019: dRCA diff dz'd supplying small/dz'd PDA+PL br (not ideal for PCI) >> med Rx  Diabetes mellitus   Hypertension  Hyperlipidemia  AAA ? S/p EVAR 01/2016   Prior CV studies: Cardiac catheterization 04/16/2019 LM normal LAD prox stent ok with 30 ISR, dist 75; D2 75 LCx prox 60, mid 60; OM2 stent ok RCA ost stent ok, mid 70, dist 55; RPDA w/ severe dz EF 55-65   Myoview 08/17/2018 Normal perfusion; EF 59; Low Risk  Echocardiogram 02/09/2018 EF 50-55, no RWMA, mod MAC  Echocardiogram 06/13/17 EF 55-60, no RWMA, Gr 1 DD, mod LAE, trivial TR, PASP 20, trivial eff  LHC 10/29/15 PCI: 3 x 28 mm Promus, 3 x 16 mm Promus, 3 x 12 mm Promus DES to RCA   LHC (06/06/13):  PCI: 3.0 x 22 mm Resolute DES to proximal LAD     History of Present Illness:    Ms. Dicenso was last seen in 6/21.  She returns for f/u.  She is here alone.  She moved an unusual way and developed acute right shoulder pain while coming in the office.  She has more pain with range of motion.  She has not had any known injury.  She continues to have chest pain from time to time.  It is really difficult to know if this is gotten any worse.  Overall, it seems that she is describing stable symptoms.  She has not had significant shortness of breath, orthopnea, leg edema.  She has not had  syncope.    Past Medical History:  Diagnosis Date  . AAA (abdominal aortic aneurysm) ([name])    a. s/p stent graft repair 01/2016.  [name] Acute ischemic colitis ([name]) 07/29/2010   Diagnosed June, 2012 characterized by acute lower GI bleeding, spontaneously resolved.   . Acute respiratory infection 06/09/2014  . Acute sinus infection 05/14/2015  . Allergic rhinitis, cause unspecified   . Angioedema of lips 06/03/2014  . Anxiety state, unspecified   . Arthritis of left hip 04/16/2015  . Bilateral hearing loss 07/19/2017  . Chronic LBP   . Coronary artery disease    a. inferior MI 1998 s/p PCI of RCA. b. stenting of Cx 04/2010. c. DES to prox LAD 05/2013. d. DES x 3 in 10/2015. // Myoview 07/2018:  EF 59, normal perfusion, low risk   . Coronary artery disease involving native heart without angina pectoris 07/31/2006   Qualifier: Diagnosis of  By: [name] MD, [name]  ANGIOGRAPHIC DATA:  1. Ventriculography done in the RAO projection reveals a small wall  motion abnormality in the mid inferior wall. Ejection fraction  would be estimated at around 50%.  2. The right coronary artery has  some progressive disease of about 60-  80% in the proximal mid segment. The distal vessel appears to be  50-70% narrowing althou  . Degeneration of lumbar or lumbosacral intervertebral disc   . Depressive disorder, not elsewhere classified   . Diabetes ([place]) 11/03/2006   Qualifier: Diagnosis of  By: [name] MD, [name]   . Diabetes mellitus    TYPE II  . Frequent urination 05/08/2014  . Gout 08/22/2013  . History of endovascular stent graft for abdominal aortic aneurysm (AAA) 01/22/2016  . Hyperlipidemia   . Hypertension   . Hypothyroidism   . Iron deficiency anemia 06/13/2017  . Lower GI bleed 06/2010   Diverticular bleed  . Lumbar radiculopathy 05/15/2015  . MENOPAUSAL DISORDER 10/29/2007   Qualifier: Diagnosis of  By: [name] MD, [name]   . Myalgia 09/25/2013  . Noncompliance with medications 02/26/2014  . Obesity, unspecified    . OTITIS MEDIA, SEROUS, CHRONIC 04/23/2007   Qualifier: Diagnosis of  By: [name] MD, [name]   . Thoracic disc disease 11/19/2018  . URTICARIA 09/23/2009   Qualifier: Diagnosis of  By: [name] MD, [name] Venous insufficiency 07/19/2017    Current Medications: Current Meds  Medication Sig  . allopurinol (ZYLOPRIM) 300 MG tablet Take 1 tablet (300 mg total) by mouth as needed (for Gout).  [name] aspirin 81 MG EC tablet Take 81 mg by mouth daily.   [name] atenolol (TENORMIN) 25 MG tablet Take 1 tablet (25 mg total) by mouth daily. TAKE 1 TABLET TWICE DAILY  . atorvastatin (LIPITOR) 40 MG tablet Take 1 tablet (40 mg total) by mouth daily.  [name] azithromycin (ZITHROMAX) 250 MG tablet 2 tab by mouth day 1, then 1 per day  . celecoxib (CELEBREX) 200 MG capsule Take 1 capsule (200 mg total) by mouth 2 (two) times daily as needed for moderate pain.  . Cholecalciferol (THERA-D 2000) 50 MCG (2000 UT) TABS 1 tab by mouth once daily  . colchicine 0.6 MG tablet Take 1 tablet (0.6 mg total) by mouth daily as needed (gout pain).  . cyclobenzaprine (FLEXERIL) 5 MG tablet Take 1 tablet (5 mg total) by mouth 3 (three) times daily as needed for muscle spasms.  [name] docusate sodium (COLACE) 100 MG capsule Take 1 capsule (100 mg total) by mouth 2 (two) times daily as needed for mild constipation.  [name] ezetimibe (ZETIA) 10 MG tablet Take 1 tablet (10 mg total) by mouth daily.  . fexofenadine (ALLEGRA) 180 MG tablet Take 1 tablet (180 mg total) by mouth as needed for allergies or rhinitis.  [name] gabapentin (NEURONTIN) 300 MG capsule Take 300 mg by mouth as needed (pain).  . hydrochlorothiazide (MICROZIDE) 12.5 MG capsule Take 1 capsule (12.5 mg total) by mouth daily.  . hydrocortisone 1 % lotion Apply 1 application topically 2 (two) times daily. Apply to affected area  . indomethacin (INDOCIN) 50 MG capsule Take 1 capsule (50 mg total) by mouth 3 (three) times daily as needed (for gout attack).  . metFORMIN (GLUCOPHAGE-XR) 500  MG 24 hr tablet   . nitroGLYCERIN (NITROSTAT) 0.4 MG SL tablet Place 1 tablet (0.4 mg total) under the tongue every 5 (five) minutes as needed for chest pain.  [name] oxyCODONE (OXY IR/ROXICODONE) 5 MG immediate release tablet 4 (four) times daily as needed.  . pantoprazole (PROTONIX) 40 MG tablet Take 40 mg by mouth as needed (heartburn acid reflux).   . pregabalin (LYRICA) 75 MG capsule Take 75 mg by mouth daily  as needed (pain).   . SYNTHROID 150 MCG tablet TAKE 1 TABLET(150 MCG) BY MOUTH DAILY  . tiZANidine (ZANAFLEX) 2 MG tablet Take 1 tablet (2 mg total) by mouth every 6 (six) hours as needed for muscle spasms.  [name] triamcinolone (NASACORT) 55 MCG/ACT AERO nasal inhaler Place 2 sprays into the nose as needed (congestion).  . vitamin B-12 (CYANOCOBALAMIN) 1000 MCG tablet Take 1 tablet (1,000 mcg total) by mouth daily.     Allergies:   Crestor [rosuvastatin calcium], Prilosec [omeprazole], Miconazole nitrate, Augmentin [amoxicillin-pot clavulanate], and Doxycycline   Social History   Tobacco Use  . Smoking status: Former Smoker    Years: 30.00    Types: Cigarettes    Quit date: 05/31/1986    Years since quitting: 33.9  . Smokeless tobacco: Never Used  Vaping Use  . Vaping Use: Never used  Substance Use Topics  . Alcohol use: No  . Drug use: No     Family Hx: The patient's family history includes Arthritis in her maternal aunt; Breast cancer in her maternal aunt; Diabetes in her brother and maternal aunt; Heart attack (age of onset: 16) in her mother; Heart attack (age of onset: 81) in her father; Heart disease in her father and mother. There is no history of Colon cancer.  Review of Systems  Musculoskeletal: Positive for joint pain (acute R shoulder pain started today).     EKGs/Labs/Other Test Reviewed:    EKG:  EKG is  ordered today.  The ekg ordered today demonstrates NSR, HR 81, LAD, right bundle branch block, QTC 457, no change from prior tracing  Recent Labs: 03/19/2020: BUN  9; Creatinine, Ser 1.02; Hemoglobin 14.3; Platelets 297.0; Potassium 3.8; Sodium 143; TSH 2.32 03/26/2020: ALT 11   Recent Lipid Panel Lab Results  Component Value Date/Time   CHOL 177 03/26/2020 02:43 PM   TRIG 115 03/26/2020 02:43 PM   TRIG 102 01/02/2006 08:25 AM   HDL 33 (L) 03/26/2020 02:43 PM   CHOLHDL 5.4 (H) 03/26/2020 02:43 PM   CHOLHDL 5 03/19/2020 05:08 PM   LDLCALC 123 (H) 03/26/2020 02:43 PM   LDLDIRECT 131.0 03/27/2015 11:37 AM      Risk Assessment/Calculations:      Physical Exam:    VS:  BP 112/74   Pulse 81   Ht _0  (1.676 m)   Wt 220 lb (99.8 kg)   BMI 35.51 kg/m     Wt Readings from Last 3 Encounters:  04/29/20 220 lb (99.8 kg)  03/30/20 218 lb 12.8 oz (99.2 kg)  03/19/20 224 lb (101.6 kg)     Constitutional:      Appearance: Healthy appearance. Not in distress.  Pulmonary:     Effort: Pulmonary effort is normal.     Breath sounds: No wheezing. No rales.  Cardiovascular:     Normal rate. Regular rhythm. Normal S1. Normal S2.     Murmurs: There is no murmur.  Edema:    Peripheral edema absent.  Abdominal:     Palpations: Abdomen is soft.  Musculoskeletal:     Cervical back: Neck supple. Skin:    General: Skin is warm and dry.  Neurological:     Mental Status: Alert and oriented to person, place and time.     Cranial Nerves: Cranial nerves are intact.         ASSESSMENT & PLAN:    1. Coronary artery disease of native artery of native heart with stable angina pectoris [facility]) Prior history of stenting  to the LAD, LCx and RCA.  Cardiac catheterization in March 2021 demonstrated diffuse distal RCA disease leading into a severely diseased small PDA.  Medical therapy has been recommended.    She continues to have occasional symptoms of chest discomfort.  When she was seen in the hospital in 3/21, she was prescribed isosorbide 15 mg daily.  I have seen her several times since then.  She has the medication at home but has not tried to take it.  I  have asked her several times to try to take this medication.  I have asked her again today to start on isosorbide 15 mg daily.  I offered her stress testing today.  We discussed the risks and benefits.  She would like to hold off on proceeding with stress testing at this time.  I have asked her to follow-up sooner if she has any worsening symptoms.  Continue current dose of aspirin, atenolol.  2. Mixed hyperlipidemia Her lipids are uncontrolled.  She has not been taking atorvastatin or ezetimibe.  We checked into getting her approval for Inclisiran.  However, this is too expensive.  I have asked her again to resume atorvastatin and ezetimibe.  Obtain follow-up CMET, lipids in 3 months.  3. Essential hypertension The patient's blood pressure is controlled on her current regimen.  Continue current therapy.   4. AAA (abdominal aortic aneurysm) without rupture (HCC) Status post endovascular repair.  She is due for follow-up with vascular surgery later this year.      Dispo:  Return in about 6 months (around 10/29/2020) for Routine 6 month follow up with Dr.Cooper. .   Medication Adjustments/Labs and Tests Ordered: Current medicines are reviewed at length with the patient today.  Concerns regarding medicines are outlined above.  Tests Ordered: Orders Placed This Encounter  Procedures  . Comp Met (CMET)  . Lipid Profile  . EKG 12-Lead   Medication Changes: No orders of the defined types were placed in this encounter.   Signed, Richardson Dopp, PA-C  04/29/2020 3:55 PM    [facility] [address], [place], [place]  [address] Phone: [phone]; Fax: [phone]

## 2020-04-29 NOTE — Patient Instructions (Addendum)
Medication Instructions:  Your physician recommends that you continue on your current medications as directed. Please refer to the Current Medication list given to you today.  TAKE IMDUR ( 15 MG) DAILY TAKE ATORVASTATIN ( 40 MG) DAILY  TAKE ZETIA ( 10 MG) DAILY    *If you need a refill on your cardiac medications before your next appointment, please call your pharmacy*   Lab Work: Your physician recommends that you return for a FASTING lipid profile/cmet on Thursday, July 14 between 7:30-4:30  Fasting from midnight the night before.   If you have labs (blood work) drawn today and your tests are completely normal, you will receive your results only by: [name] MyChart Message (if you have MyChart) OR . A paper copy in the mail If you have any lab test that is abnormal or we need to change your treatment, we will call you to review the results.   Testing/Procedures: -NONE   Follow-Up: At [facility], you and your health needs are our priority.  As part of our continuing mission to provide you with exceptional heart care, we have created designated Provider Care Teams.  These Care Teams include your primary Cardiologist (physician) and Advanced Practice Providers (APPs -  Physician Assistants and Nurse Practitioners) who all work together to provide you with the care you need, when you need it.  We recommend signing up for the patient portal called "MyChart".  Sign up information is provided on this After Visit Summary.  MyChart is used to connect with patients for Virtual Visits (Telemedicine).  Patients are able to view lab/test results, encounter notes, upcoming appointments, etc.  Non-urgent messages can be sent to your provider as well.   To learn more about what you can do with MyChart, go to [email or web address].    Your next appointment:   6 month(s)  The format for your next appointment:   In Person  Provider:   Sherren Mocha, MD   Other Instructions Your physician  wants you to follow-up in: 6 month with Dr.Cooper.  You will receive a reminder letter in the mail two months in advance. If you don't receive a letter, please call our office to schedule the follow-up appointment.

## 2020-05-12 ENCOUNTER — Ambulatory Visit
Admission: RE | Admit: 2020-05-12 | Discharge: 2020-05-12 | Disposition: A | Discharge status: Home / Self Care | Payer: Medicare HMO | Source: Ambulatory Visit | Attending: Orthopaedic Surgery | Admitting: Orthopaedic Surgery

## 2020-05-12 DIAGNOSIS — Z9889 Other specified postprocedural states: Secondary | ICD-10-CM | POA: Diagnosis not present

## 2020-05-12 DIAGNOSIS — M4324 Fusion of spine, thoracic region: Secondary | ICD-10-CM | POA: Diagnosis not present

## 2020-05-12 DIAGNOSIS — M4325 Fusion of spine, thoracolumbar region: Secondary | ICD-10-CM

## 2020-05-12 DIAGNOSIS — M4314 Spondylolisthesis, thoracic region: Secondary | ICD-10-CM | POA: Diagnosis not present

## 2020-05-12 DIAGNOSIS — Z981 Arthrodesis status: Secondary | ICD-10-CM | POA: Diagnosis not present

## 2020-05-20 ENCOUNTER — Other Ambulatory Visit: Payer: Self-pay

## 2020-05-20 ENCOUNTER — Other Ambulatory Visit: Payer: Self-pay | Admitting: Internal Medicine

## 2020-05-20 ENCOUNTER — Ambulatory Visit (INDEPENDENT_AMBULATORY_CARE_PROVIDER_SITE_OTHER): Payer: Medicare HMO | Admitting: Internal Medicine

## 2020-05-20 ENCOUNTER — Encounter: Payer: Self-pay | Admitting: Internal Medicine

## 2020-05-20 VITALS — BP 136/74 | HR 74 | Temp 100.0°F | Ht 66.0 in | Wt 219.0 lb

## 2020-05-20 DIAGNOSIS — J309 Allergic rhinitis, unspecified: Secondary | ICD-10-CM

## 2020-05-20 DIAGNOSIS — J22 Unspecified acute lower respiratory infection: Secondary | ICD-10-CM | POA: Diagnosis not present

## 2020-05-20 DIAGNOSIS — E1165 Type 2 diabetes mellitus with hyperglycemia: Secondary | ICD-10-CM

## 2020-05-20 DIAGNOSIS — Z1231 Encounter for screening mammogram for malignant neoplasm of breast: Secondary | ICD-10-CM

## 2020-05-20 MED ORDER — HYDROCODONE BIT-HOMATROP MBR 5-1.5 MG/5ML PO SOLN: 5.0000 mL | Freq: Four times a day (QID) | ORAL | 0 refills | Status: AC | PRN

## 2020-05-20 MED ORDER — METHYLPREDNISOLONE ACETATE 80 MG/ML IJ SUSP
80.0000 mg | Freq: Once | INTRAMUSCULAR | Status: AC
  Administered 2020-05-20: 80 mg via INTRAMUSCULAR

## 2020-05-20 MED ORDER — LEVOFLOXACIN 500 MG PO TABS: 500.0000 mg | ORAL_TABLET | Freq: Every day | ORAL | 0 refills | Status: AC

## 2020-05-20 NOTE — Progress Notes (Addendum)
Patient ID: [name], female   DOB: September 28, 1945, 75 y.o.   MRN: [id number]        Chief Complaint: URI symptoms and allergies       HPI:  [name] is a 75 y.o. female  Here with 2-3 days acute onset fever, facial pain, pressure, headache, general weakness and malaise, and greenish d/c, with mild ST and cough, but pt denies chest pain, wheezing, increased sob or doe, orthopnea, PND, increased LE swelling, palpitations, dizziness or syncope..  Also, Does have several wks ongoing nasal allergy symptoms with clearish congestion, itch and sneezing, without fever, pain, ST, cough, swelling or wheezing.   Pt denies polydipsia, polyuria, or new focal neuro /s.  No other new complaints          Wt Readings from Last 3 Encounters:  05/20/20 219 lb (99.3 kg)  04/29/20 220 lb (99.8 kg)  03/30/20 218 lb 12.8 oz (99.2 kg)   BP Readings from Last 3 Encounters:  05/20/20 136/74  04/29/20 112/74  03/30/20 102/66         Past Medical History:  Diagnosis Date  . AAA (abdominal aortic aneurysm) ([place])    a. s/p stent graft repair 01/2016.  [name] Acute ischemic colitis ([place]) 07/29/2010   Diagnosed June, 2012 characterized by acute lower GI bleeding, spontaneously resolved.   . Acute respiratory infection 06/09/2014  . Acute sinus infection 05/14/2015  . Allergic rhinitis, cause unspecified   . Angioedema of lips 06/03/2014  . Anxiety state, unspecified   . Arthritis of left hip 04/16/2015  . Bilateral hearing loss 07/19/2017  . Chronic LBP   . Coronary artery disease    a. inferior MI 1998 s/p PCI of RCA. b. stenting of Cx 04/2010. c. DES to prox LAD 05/2013. d. DES x 3 in 10/2015. // Myoview 07/2018:  EF 59, normal perfusion, low risk   . Coronary artery disease involving native heart without angina pectoris 07/31/2006   Qualifier: Diagnosis of  By: [name] MD, [name]  ANGIOGRAPHIC DATA:  1. Ventriculography done in the RAO projection reveals a small wall  motion abnormality in the mid inferior wall.  Ejection fraction  would be estimated at around 50%.  2. The right coronary artery has some progressive disease of about 60-  80% in the proximal mid segment. The distal vessel appears to be  50-70% narrowing althou  . Degeneration of lumbar or lumbosacral intervertebral disc   . Depressive disorder, not elsewhere classified   . Diabetes ([place]) 11/03/2006   Qualifier: Diagnosis of  By: [name] MD, [name]   . Diabetes mellitus    TYPE II  . Frequent urination 05/08/2014  . Gout 08/22/2013  . History of endovascular stent graft for abdominal aortic aneurysm (AAA) 01/22/2016  . Hyperlipidemia   . Hypertension   . Hypothyroidism   . Iron deficiency anemia 06/13/2017  . Lower GI bleed 06/2010   Diverticular bleed  . Lumbar radiculopathy 05/15/2015  . MENOPAUSAL DISORDER 10/29/2007   Qualifier: Diagnosis of  By: [name] MD, [name]   . Myalgia 09/25/2013  . Noncompliance with medications 02/26/2014  . Obesity, unspecified   . OTITIS MEDIA, SEROUS, CHRONIC 04/23/2007   Qualifier: Diagnosis of  By: [name] MD, [name]   . Thoracic disc disease 11/19/2018  . URTICARIA 09/23/2009   Qualifier: Diagnosis of  By: [name] MD, [name] Venous insufficiency 07/19/2017   Past Surgical History:  Procedure Laterality Date  . CARDIAC CATHETERIZATION  PCI OF BOTH THE CIRCUMFLEX AND LEFT ANTERIOR DESCENDING ARTERY  . CARDIAC CATHETERIZATION N/A 10/29/2015   Procedure: Coronary Stent Intervention;  Surgeon: Nelva Bush, MD;  Location: [facility];  Service: Cardiovascular;  Laterality: N/A;  . CARDIAC CATHETERIZATION N/A 10/29/2015   Procedure: Coronary/Graft Angiography;  Surgeon: Nelva Bush, MD;  Location: [facility];  Service: Cardiovascular;  Laterality: N/A;  . CARDIAC CATHETERIZATION N/A 10/29/2015   Procedure: Intravascular Pressure Wire/FFR Study;  Surgeon: Nelva Bush, MD;  Location: [facility];  Service: Cardiovascular;  Laterality: N/A;  . CESAREAN SECTION    .  ENDOVASCULAR STENT INSERTION N/A 01/22/2016   Procedure: ABDOMINAL AORTIC ENDOVASCULAR STENT GRAFT INSERTION;  Surgeon: Rosetta Posner, MD;  Location: [facility];  Service: Vascular;  Laterality: N/A;  . HEART STENT  04-2010  and  Jun 07, 2013   X 3  . LEFT HEART CATH AND CORONARY ANGIOGRAPHY N/A 04/03/2019   Procedure: LEFT HEART CATH AND CORONARY ANGIOGRAPHY;  Surgeon: Nelva Bush, MD;  Location: [facility];  Service: Cardiovascular;  Laterality: N/A;  . LEFT HEART CATHETERIZATION WITH CORONARY ANGIOGRAM N/A 06/07/2013   Procedure: LEFT HEART CATHETERIZATION WITH CORONARY ANGIOGRAM;  Surgeon: [name], MD;  Location: [facility] CATH LAB;  Service: Cardiovascular;  Laterality: N/A;  . LEFT HEART CATHETERIZATION WITH CORONARY ANGIOGRAM N/A 02/25/2014   Procedure: LEFT HEART CATHETERIZATION WITH CORONARY ANGIOGRAM;  Surgeon: Troy Sine, MD;  Location: [facility] CATH LAB;  Service: Cardiovascular;  Laterality: N/A;  . LUMBAR FUSION  01/2007   DR. Patrice Paradise...3-LEVEL WITH FIXATION  . OOPHORECTOMY     BSO? pt.unsure  . PARATHYROIDECTOMY    . SPINE SURGERY    . THYROIDECTOMY    . TOTAL ABDOMINAL HYSTERECTOMY      reports that she quit smoking about 34 years ago. Her smoking use included cigarettes. She quit after 30.00 years of use. She has never used smokeless tobacco. She reports that she does not drink alcohol and does not use drugs. family history includes Arthritis in her maternal aunt; Breast cancer in her maternal aunt; Diabetes in her brother and maternal aunt; Heart attack (age of onset: 63) in her mother; Heart attack (age of onset: 63) in her father; Heart disease in her father and mother. Allergies  Allergen Reactions  . Crestor [Rosuvastatin Calcium] Other (See Comments)    Feeling poor  . Prilosec [Omeprazole] Other (See Comments)    Chest pain  . Miconazole Nitrate Hives    REACTION: hives  . Augmentin [Amoxicillin-Pot Clavulanate] Hives, Itching and Rash    Has patient had a PCN  reaction causing immediate rash, facial/tongue/throat swelling, SOB or lightheadedness with hypotension:unsure Has patient had a PCN reaction causing severe rash involving mucus membranes or skin necrosis:unsure Has patient had a PCN reaction that required hospitalization:No Has patient had a PCN reaction occurring within the last 10 years:NO If all of the above answers are "NO", then may proceed with Cephalosporin use. Has patient had a PCN reaction causing immediate rash, facial/tongue/throat swelling, SOB or lightheadedness with hypotension:unsure Has patient had a PCN reaction causing severe rash involving mucus membranes or skin necrosis:unsure Has patient had a PCN reaction that required hospitalization:No Has patient had a PCN reaction occurring within the last 10 years:NO If all of the above answers are "NO", then may proceed with Cephalosporin use.   [name] Doxycycline Other (See Comments)    REACTION: gi upset   Current Outpatient Medications on File Prior to Visit  Medication Sig Dispense Refill  . allopurinol (ZYLOPRIM) 300 MG tablet Take 1 tablet (300 mg total) by mouth as needed (for Gout). 90 tablet 3  . aspirin 81 MG EC tablet Take 81 mg by mouth daily.     [name] atenolol (TENORMIN) 25 MG tablet Take 1 tablet (25 mg total) by mouth daily. TAKE 1 TABLET TWICE DAILY 180 tablet 0  . atorvastatin (LIPITOR) 40 MG tablet Take 1 tablet (40 mg total) by mouth daily. 90 tablet 3  . azithromycin (ZITHROMAX) 250 MG tablet 2 tab by mouth day 1, then 1 per day 6 tablet 1  . celecoxib (CELEBREX) 200 MG capsule Take 1 capsule (200 mg total) by mouth 2 (two) times daily as needed for moderate pain. 180 capsule 1  . colchicine 0.6 MG tablet Take 1 tablet (0.6 mg total) by mouth daily as needed (gout pain). 30 tablet 2  . cyclobenzaprine (FLEXERIL) 5 MG tablet Take 1 tablet (5 mg total) by mouth 3 (three) times daily as needed for muscle spasms. 30 tablet 2  . docusate sodium (COLACE) 100 MG capsule  Take 1 capsule (100 mg total) by mouth 2 (two) times daily as needed for mild constipation. 30 capsule 1  . ezetimibe (ZETIA) 10 MG tablet Take 1 tablet (10 mg total) by mouth daily. 90 tablet 1  . fexofenadine (ALLEGRA) 180 MG tablet Take 1 tablet (180 mg total) by mouth as needed for allergies or rhinitis. 90 tablet 3  . gabapentin (NEURONTIN) 300 MG capsule Take 300 mg by mouth as needed (pain).    . hydrochlorothiazide (MICROZIDE) 12.5 MG capsule Take 1 capsule (12.5 mg total) by mouth daily. 90 capsule 3  . hydrocortisone 1 % lotion Apply 1 application topically 2 (two) times daily. Apply to affected area 96 g 0  . indomethacin (INDOCIN) 50 MG capsule Take 1 capsule (50 mg total) by mouth 3 (three) times daily as needed (for gout attack). 90 capsule 3  . metFORMIN (GLUCOPHAGE-XR) 500 MG 24 hr tablet     . nitroGLYCERIN (NITROSTAT) 0.4 MG SL tablet Place 1 tablet (0.4 mg total) under the tongue every 5 (five) minutes as needed for chest pain. 25 tablet 3  . oxyCODONE (OXY IR/ROXICODONE) 5 MG immediate release tablet 4 (four) times daily as needed.    . pantoprazole (PROTONIX) 40 MG tablet Take 40 mg by mouth as needed (heartburn acid reflux).     . pregabalin (LYRICA) 75 MG capsule Take 75 mg by mouth daily as needed (pain).     . SYNTHROID 150 MCG tablet TAKE 1 TABLET(150 MCG) BY MOUTH DAILY 90 tablet 1  . tiZANidine (ZANAFLEX) 2 MG tablet Take 1 tablet (2 mg total) by mouth every 6 (six) hours as needed for muscle spasms. 40 tablet 1  . triamcinolone (NASACORT) 55 MCG/ACT AERO nasal inhaler Place 2 sprays into the nose as needed (congestion). 1 each 12  . vitamin B-12 (CYANOCOBALAMIN) 1000 MCG tablet Take 1 tablet (1,000 mcg total) by mouth daily. 90 tablet 3  . Cholecalciferol (THERA-D 2000) 50 MCG (2000 UT) TABS 1 tab by mouth once daily (Patient not taking: Reported on 05/20/2020) 90 tablet 99  . isosorbide mononitrate (IMDUR) 30 MG 24 hr tablet Take 0.5 tablets (15 mg total) by mouth daily.  45 tablet 3   No current facility-administered medications on file prior to visit.        ROS:  All others reviewed and negative.  Objective  PE:  BP 136/74 (BP Location: Right Arm, Patient Position: Sitting, Cuff Size: Large)   Pulse 74   Temp 100 F (37.8 C) (Oral)   Ht 5\' 6"  (1.676 m)   Wt 219 lb (99.3 kg)   SpO2 96%   BMI 35.35 kg/m                 Constitutional: Pt appears in NAD               HENT: Head: NCAT.                Right Ear: External ear normal.                 Left Ear: External ear normal.                Eyes: . Pupils are equal, round, and reactive to light. Conjunctivae and EOM are normal; Bilat tm's with mild erythema.  Max sinus areas mild tender.  Pharynx with mild erythema, no exudate               Nose: without d/c or deformity               Neck: Neck supple. Gross normal ROM               Cardiovascular: Normal rate and regular rhythm.                 Pulmonary/Chest: Effort normal and breath sounds without rales or wheezing.                Abd:  Soft, NT, ND, + BS, no organomegaly               Neurological: Pt is alert. At baseline orientation, motor grossly intact               Skin: Skin is warm. No rashes, no other new lesions, LE edema -  none               Psychiatric: Pt behavior is normal without agitation   Micro: none  Cardiac tracings I have personally interpreted today:  none  Pertinent Radiological findings (summarize): none   Lab Results  Component Value Date   WBC 6.8 03/19/2020   HGB 14.3 03/19/2020   HCT 44.1 03/19/2020   PLT 297.0 03/19/2020   GLUCOSE 123 (H) 03/19/2020   CHOL 177 03/26/2020   TRIG 115 03/26/2020   HDL 33 (L) 03/26/2020   LDLDIRECT 131.0 03/27/2015   LDLCALC 123 (H) 03/26/2020   ALT 11 03/26/2020   AST 13 03/26/2020   NA 143 03/19/2020   K 3.8 03/19/2020   CL 104 03/19/2020   CREATININE 1.02 03/19/2020   BUN 9 03/19/2020   CO2 28 03/19/2020   TSH 2.32 03/19/2020   INR 1.08 01/22/2016    HGBA1C 6.3 03/19/2020   MICROALBUR 0.8 03/19/2020   Assessment/Plan:  [name] is a 76 y.o. Black or African American [2] female with  has a past medical history of AAA (abdominal aortic aneurysm) ([place]), Acute ischemic colitis ([place]) (07/29/2010), Acute respiratory infection (06/09/2014), Acute sinus infection (05/14/2015), Allergic rhinitis, cause unspecified, Angioedema of lips (06/03/2014), Anxiety state, unspecified, Arthritis of left hip (04/16/2015), Bilateral hearing loss (07/19/2017), Chronic LBP, Coronary artery disease, Coronary artery disease involving native heart without angina pectoris (07/31/2006), Degeneration of lumbar or lumbosacral intervertebral disc, Depressive disorder, not elsewhere classified, Diabetes ([place]) (11/03/2006), Diabetes mellitus, Frequent urination (05/08/2014), Gout (  08/22/2013), History of endovascular stent graft for abdominal aortic aneurysm (AAA) (01/22/2016), Hyperlipidemia, Hypertension, Hypothyroidism, Iron deficiency anemia (06/13/2017), Lower GI bleed (06/2010), Lumbar radiculopathy (05/15/2015), MENOPAUSAL DISORDER (10/29/2007), Myalgia (09/25/2013), Noncompliance with medications (02/26/2014), Obesity, unspecified, OTITIS MEDIA, SEROUS, CHRONIC (04/23/2007), Thoracic disc disease (11/19/2018), URTICARIA (09/23/2009), and Venous insufficiency (07/19/2017).  Acute respiratory infection Mild to mod, for antibx course,  Check covid testing, cough med prn, to f/u any worsening symptoms or concerns  Allergic rhinitis Mild to mod, for depomedrol IM 80,  to f/u any worsening symptoms or concerns  Diabetes [facility]) Lab Results  Component Value Date   HGBA1C 6.3 03/19/2020   Stable, pt to continue current medical treatment metformin   Followup: Return if symptoms worsen or fail to improve.  [name], MD 05/23/2020 8:51 PM [facility]

## 2020-05-20 NOTE — Patient Instructions (Signed)
Please take all new medication as prescribed - the antibiotic and cough medicine  You had the steroid shot today  The covid testing should be done today  Please continue all other medications as before, and refills have been done if requested.  Please have the pharmacy call with any other refills you may need.  Please continue your efforts at being more active, low cholesterol diet, and weight control  Please keep your appointments with your specialists as you may have planned

## 2020-05-21 LAB — NOVEL CORONAVIRUS, NAA: SARS-CoV-2, NAA: NOT DETECTED

## 2020-05-23 ENCOUNTER — Encounter: Payer: Self-pay | Admitting: Internal Medicine

## 2020-05-23 NOTE — Assessment & Plan Note (Addendum)
Mild to mod, for antibx course,  Check covid testing, cough med prn, to f/u any worsening symptoms or concerns

## 2020-05-23 NOTE — Assessment & Plan Note (Signed)
Lab Results  Component Value Date   HGBA1C 6.3 03/19/2020   Stable, pt to continue current medical treatment metformin

## 2020-05-23 NOTE — Assessment & Plan Note (Signed)
Mild to mod, for depomedrol IM 80,  to f/u any worsening symptoms or concerns 

## 2020-06-11 ENCOUNTER — Other Ambulatory Visit: Payer: Self-pay | Admitting: *Deleted

## 2020-06-11 DIAGNOSIS — I714 Abdominal aortic aneurysm, without rupture, unspecified: Secondary | ICD-10-CM

## 2020-06-30 ENCOUNTER — Ambulatory Visit: Payer: Medicare HMO | Admitting: Family Medicine

## 2020-06-30 NOTE — Progress Notes (Deleted)
   I, [name], LAT, ATC acting as a scribe for Lynne Leader, MD.  Simona Huh Havrilla is a 75 y.o. female who presents to [place] at [facility] today for continued chronic bilat knee pain due to severe DJD. Pt was last seen by Dr. Georgina Snell on 03/30/20 and was given bilat knee steroid injections and advised to work on quad strengthening and weight loss. Today, pt reports   Dx imaging: 11/21/19 R & L knee XR  Pertinent review of systems: ***  Relevant historical information: ***   Exam:  There were no vitals taken for this visit. General: Well Developed, well nourished, and in no acute distress.   MSK:     Lab and Radiology Results No results found for this or any previous visit (from the past 72 hour(s)). No results found.     Assessment and Plan: 75 y.o. female with ***   PDMP not reviewed this encounter. No orders of the defined types were placed in this encounter.  No orders of the defined types were placed in this encounter.    Discussed warning signs or symptoms. Please see discharge instructions. Patient expresses understanding.

## 2020-07-06 ENCOUNTER — Ambulatory Visit: Payer: Self-pay

## 2020-07-06 ENCOUNTER — Ambulatory Visit (INDEPENDENT_AMBULATORY_CARE_PROVIDER_SITE_OTHER): Payer: Medicare HMO | Admitting: Family Medicine

## 2020-07-06 ENCOUNTER — Other Ambulatory Visit: Payer: Self-pay

## 2020-07-06 VITALS — BP 138/82 | HR 75 | Ht 66.0 in | Wt 216.0 lb

## 2020-07-06 DIAGNOSIS — G8929 Other chronic pain: Secondary | ICD-10-CM

## 2020-07-06 DIAGNOSIS — M170 Bilateral primary osteoarthritis of knee: Secondary | ICD-10-CM | POA: Insufficient documentation

## 2020-07-06 DIAGNOSIS — M25561 Pain in right knee: Secondary | ICD-10-CM

## 2020-07-06 DIAGNOSIS — M25562 Pain in left knee: Secondary | ICD-10-CM

## 2020-07-06 NOTE — Progress Notes (Signed)
I, [name], LAT, ATC acting as a scribe for Lynne Leader, MD.  [name] is a 75 y.o. female who presents to [place] at [facility] today for chronic bilat knee pain due to severe DJD. Pt was last seen by Dr. Georgina Snell on 03/30/20 and was given bilat knee steroid injections and advised to cont conservative management working on quad strengthening and weight loss. Today, pt reports she got longer relief from the prior steroid injections. Pt c/o difficulty transitioning from sitting to standing. No radiating pain reported.  Dx imaging: 11/21/19 R & L knee XR  Pertinent review of systems: No fevers or chills  Relevant historical information: Coronary artery disease   Exam:  BP 138/82 (BP Location: Right Arm, Patient Position: Sitting, Cuff Size: Normal)   Pulse 75   Ht 5\' 6"  (1.676 m)   Wt 216 lb (98 kg)   SpO2 96%   BMI 34.86 kg/m  General: Well Developed, well nourished, and in no acute distress.   MSK: Knees bilaterally mild effusion normal motion with crepitation.  Antalgic gait.    Lab and Radiology Results  Procedure: Real-time Ultrasound Guided Injection of right knee superior lateral patellar space Device: Philips Affiniti 50G Images permanently stored and available for review in PACS Verbal informed consent obtained.  Discussed risks and benefits of procedure. Warned about infection bleeding damage to structures skin hypopigmentation and fat atrophy among others. Patient expresses understanding and agreement Time-out conducted.   Noted no overlying erythema, induration, or other signs of local infection.   Skin prepped in a sterile fashion.   Local anesthesia: Topical Ethyl chloride.   With sterile technique and under real time ultrasound guidance: 40 mg of Kenalog and 2 mL of Marcaine injected into knee joint. Fluid seen entering the joint capsule.   Completed without difficulty   Pain immediately resolved suggesting accurate placement of the  medication.   Advised to call if fevers/chills, erythema, induration, drainage, or persistent bleeding.   Images permanently stored and available for review in the ultrasound unit.  Impression: Technically successful ultrasound guided injection.    Procedure: Real-time Ultrasound Guided Injection of left knee superior lateral patellar space Device: Philips Affiniti 50G Images permanently stored and available for review in PACS Verbal informed consent obtained.  Discussed risks and benefits of procedure. Warned about infection bleeding damage to structures skin hypopigmentation and fat atrophy among others. Patient expresses understanding and agreement Time-out conducted.   Noted no overlying erythema, induration, or other signs of local infection.   Skin prepped in a sterile fashion.   Local anesthesia: Topical Ethyl chloride.   With sterile technique and under real time ultrasound guidance: 40 mg of Kenalog and 2 mL of Marcaine injected into knee joint. Fluid seen entering the joint capsule.   Completed without difficulty   Pain immediately resolved suggesting accurate placement of the medication.   Advised to call if fevers/chills, erythema, induration, drainage, or persistent bleeding.   Images permanently stored and available for review in the ultrasound unit.  Impression: Technically successful ultrasound guided injection.       EXAM: LEFT KNEE 3 VIEWS; RIGHT KNEE 3 VIEWS   COMPARISON:  None.   FINDINGS: No fracture or dislocation of the bilateral knees. There is severe, symmetric bilateral tricompartmental arthrosis of the knees, with near total bone-on-bone joint space loss, most severe in the patellofemoral compartments bilaterally., of note, there are multiple calcified loose bodies within the superior joint space of the left knee  and within the posterior joint spaces of the bilateral knees. No knee joint effusion. Vascular calcinosis.   IMPRESSION: 1.  No fracture  or dislocation of the bilateral knees.   2. There is severe, symmetric bilateral tricompartmental arthrosis of the knees, with near total bone-on-bone joint space loss, most severe in the patellofemoral compartments bilaterally.   3. There are multiple calcified loose bodies within the superior joint space of the left knee and within the posterior joint spaces of the bilateral knees.     Electronically Signed   By: Eddie Candle M.D.   On: 11/23/2019 15:50 I, Lynne Leader, personally (independently) visualized and performed the interpretation of the images attached in this note.     Assessment and Plan: 75 y.o. female with bilateral knee pain due to DJD.  Patient has end-stage DJD on x-ray November 2021.  She has been doing reasonably well with intermittent steroid injections about every 3 months.  Last injection was 3 months ago.  Plan for repeat steroid injection today.  Additionally some of the pain could be due to pseudogout for which she is given colchicine which she can take intermittently.  Plan for repeat injection today.  If this does not work well I think the next step should be surgical consultation for total knee arthroplasty.  Although she does have some health concerns and is 61 I think that is probably her best bet at this point. Recheck 3 months.   PDMP not reviewed this encounter. Orders Placed This Encounter  Procedures   [place] LIMITED JOINT SPACE STRUCTURES LOW BILAT(NO LINKED CHARGES)    Standing Status:   Future    Number of Occurrences:   1    Standing Expiration Date:   01/05/2021    Order Specific Question:   Reason for Exam (SYMPTOM  OR DIAGNOSIS REQUIRED)    Answer:   bilateral knee pain    Order Specific Question:   Preferred imaging location?    Answer:   [place]   No orders of the defined types were placed in this encounter.    Discussed warning signs or symptoms. Please see discharge instructions. Patient expresses  understanding.   The above documentation has been reviewed and is accurate and complete Lynne Leader, M.D.

## 2020-07-06 NOTE — Patient Instructions (Signed)
Thank you for coming in today.   Call or go to the ER if you develop a large red swollen joint with extreme pain or oozing puss.    Schedule the next visit in 3  months and we can do another injection then if needed.

## 2020-07-13 ENCOUNTER — Telehealth: Payer: Self-pay | Admitting: Family Medicine

## 2020-07-13 NOTE — Telephone Encounter (Signed)
Patient called stating that she was seen by Dr Georgina Snell on 07/06/2020 and had knee injections. She said that she is still having a lot of pain and does not feel like it helped at all. Please advise.

## 2020-07-14 ENCOUNTER — Telehealth: Payer: Self-pay | Admitting: Cardiovascular Disease

## 2020-07-14 NOTE — Telephone Encounter (Signed)
Try taking the colchicine.  If that does not help it may be time to talk about knee replacement.

## 2020-07-14 NOTE — Telephone Encounter (Signed)
Called both mobile and home phone and left detailed message w/ Dr. Clovis Riley advice.

## 2020-07-14 NOTE — Telephone Encounter (Signed)
Notified by scheduling that [facility] is requesting clearance for patient who is in the chair for extraction of 10 teeth. I relayed that I would need to speak with the patient.   Called patient x 2, left VM.

## 2020-07-14 NOTE — Telephone Encounter (Signed)
       [facility] Pre-operative Risk Assessment    Patient Name: DANEILLE DESILVA  DOB: [birth year]  MRN: [id number]   HEARTCARE STAFF: - Please ensure there is not already an duplicate clearance open for this procedure. - Under Visit Info/Reason for Call, type in Other and utilize the format Clearance MM/DD/YY or Clearance TBD. Do not use dashes or single digits. - If request is for dental extraction, please clarify the # of teeth to be extracted. - If the patient is currently at the dentist's office, call Pre-Op APP to address. If the patient is not currently in the dentist office, please route to the Pre-Op pool  Request for surgical clearance:  What type of surgery is being performed? 10 teeth extractions  When is this surgery scheduled? 07/14/20  What type of clearance is required (medical clearance vs. Pharmacy clearance to hold med vs. Both)? Medical  Are there any medications that need to be held prior to surgery and how long?  Practice name and name of physician performing surgery? Dr. Renard Hamper - [facility]  What is the office phone number? [phone]   7.   What is the office fax number? [phone]  8.   Anesthesia type (None, local, MAC, general) ? Epinephrine    Angeline S Hammer 07/14/2020, 9:27 AM  _________________________________________________________________   (provider comments below)

## 2020-07-15 ENCOUNTER — Ambulatory Visit: Payer: Medicare HMO

## 2020-07-15 NOTE — Telephone Encounter (Signed)
Was able to reach patient. She had teeth extracted yesterday and is recovering at home.  I will remove from pre-op pool.

## 2020-07-22 ENCOUNTER — Other Ambulatory Visit: Payer: Medicare HMO

## 2020-07-22 ENCOUNTER — Ambulatory Visit: Payer: Medicare HMO | Admitting: Vascular Surgery

## 2020-07-27 ENCOUNTER — Ambulatory Visit: Payer: Medicare HMO | Admitting: Vascular Surgery

## 2020-07-27 ENCOUNTER — Other Ambulatory Visit: Payer: Medicare HMO

## 2020-07-29 DIAGNOSIS — Z79899 Other long term (current) drug therapy: Secondary | ICD-10-CM | POA: Diagnosis not present

## 2020-07-29 DIAGNOSIS — M1712 Unilateral primary osteoarthritis, left knee: Secondary | ICD-10-CM | POA: Diagnosis not present

## 2020-07-29 DIAGNOSIS — M4325 Fusion of spine, thoracolumbar region: Secondary | ICD-10-CM | POA: Diagnosis not present

## 2020-07-29 DIAGNOSIS — M4010 Other secondary kyphosis, site unspecified: Secondary | ICD-10-CM | POA: Diagnosis not present

## 2020-07-29 DIAGNOSIS — Z79891 Long term (current) use of opiate analgesic: Secondary | ICD-10-CM | POA: Diagnosis not present

## 2020-07-29 DIAGNOSIS — M47894 Other spondylosis, thoracic region: Secondary | ICD-10-CM | POA: Diagnosis not present

## 2020-07-29 DIAGNOSIS — G894 Chronic pain syndrome: Secondary | ICD-10-CM | POA: Diagnosis not present

## 2020-07-30 ENCOUNTER — Other Ambulatory Visit: Payer: Self-pay

## 2020-07-30 ENCOUNTER — Other Ambulatory Visit: Payer: Medicare HMO

## 2020-07-30 DIAGNOSIS — I714 Abdominal aortic aneurysm, without rupture, unspecified: Secondary | ICD-10-CM

## 2020-07-30 DIAGNOSIS — I25118 Atherosclerotic heart disease of native coronary artery with other forms of angina pectoris: Secondary | ICD-10-CM | POA: Diagnosis not present

## 2020-07-30 DIAGNOSIS — E782 Mixed hyperlipidemia: Secondary | ICD-10-CM

## 2020-07-30 DIAGNOSIS — I10 Essential (primary) hypertension: Secondary | ICD-10-CM | POA: Diagnosis not present

## 2020-07-30 LAB — COMPREHENSIVE METABOLIC PANEL
ALT: 16 IU/L (ref 0–32)
AST: 18 IU/L (ref 0–40)
Albumin/Globulin Ratio: 1.2 (ref 1.2–2.2)
Albumin: 3.8 g/dL (ref 3.7–4.7)
Alkaline Phosphatase: 99 IU/L (ref 44–121)
BUN/Creatinine Ratio: 7 — ABNORMAL LOW (ref 12–28)
BUN: 6 mg/dL — ABNORMAL LOW (ref 8–27)
Bilirubin Total: 0.4 mg/dL (ref 0.0–1.2)
CO2: 25 mmol/L (ref 20–29)
Calcium: 8.7 mg/dL (ref 8.7–10.3)
Chloride: 101 mmol/L (ref 96–106)
Creatinine, Ser: 0.9 mg/dL (ref 0.57–1.00)
Globulin, Total: 3.3 g/dL (ref 1.5–4.5)
Glucose: 105 mg/dL — ABNORMAL HIGH (ref 65–99)
Potassium: 4.2 mmol/L (ref 3.5–5.2)
Sodium: 143 mmol/L (ref 134–144)
Total Protein: 7.1 g/dL (ref 6.0–8.5)
eGFR: 67 mL/min/{1.73_m2} (ref >59)

## 2020-07-30 LAB — LIPID PANEL
Chol/HDL Ratio: 4.7 ratio — ABNORMAL HIGH (ref 0.0–4.4)
Cholesterol, Total: 169 mg/dL (ref 100–199)
HDL: 36 mg/dL — ABNORMAL LOW (ref >39)
LDL Chol Calc (NIH): 111 mg/dL — ABNORMAL HIGH (ref 0–99)
Triglycerides: 122 mg/dL (ref 0–149)
VLDL Cholesterol Cal: 22 mg/dL (ref 5–40)

## 2020-08-03 ENCOUNTER — Other Ambulatory Visit: Payer: Self-pay | Admitting: *Deleted

## 2020-08-03 DIAGNOSIS — E782 Mixed hyperlipidemia: Secondary | ICD-10-CM

## 2020-08-03 DIAGNOSIS — I25118 Atherosclerotic heart disease of native coronary artery with other forms of angina pectoris: Secondary | ICD-10-CM

## 2020-08-03 MED ORDER — ATORVASTATIN CALCIUM 80 MG PO TABS: 80.0000 mg | ORAL_TABLET | Freq: Every day | ORAL | 11 refills | Status: DC

## 2020-08-04 ENCOUNTER — Other Ambulatory Visit: Payer: Self-pay | Admitting: Internal Medicine

## 2020-08-04 NOTE — Telephone Encounter (Signed)
Please refill as per office routine med refill policy (all routine meds refilled for 3 mo or monthly per pt preference up to one year from last visit, then month to month grace period for 3 mo, then further med refills will have to be denied)  

## 2020-08-14 ENCOUNTER — Ambulatory Visit: Payer: Medicare HMO | Admitting: Vascular Surgery

## 2020-08-14 ENCOUNTER — Other Ambulatory Visit (HOSPITAL_COMMUNITY): Payer: Medicare HMO

## 2020-08-20 ENCOUNTER — Other Ambulatory Visit: Payer: Self-pay

## 2020-08-20 ENCOUNTER — Telehealth: Payer: Self-pay | Admitting: Internal Medicine

## 2020-08-20 MED ORDER — ATENOLOL 25 MG PO TABS: 25.0000 mg | ORAL_TABLET | Freq: Every day | ORAL | 0 refills | Status: DC

## 2020-08-20 NOTE — Telephone Encounter (Signed)
1.Medication Requested: atenolol (TENORMIN) 25 MG tablet  2. Pharmacy (Name, [place]): [facility] [id number] - [address], [place]  Phone:  [phone] Fax:  [phone]   3. On Med List: yes  4. Last Visit with PCP: 05.04.22  5. Next visit date with PCP: n/a   Agent: Please be advised that RX refills may take up to 3 business days. We ask that you follow-up with your pharmacy.

## 2020-09-03 ENCOUNTER — Ambulatory Visit
Admission: RE | Admit: 2020-09-03 | Discharge: 2020-09-03 | Disposition: A | Discharge status: Home / Self Care | Payer: Medicare HMO | Source: Ambulatory Visit | Attending: Internal Medicine | Admitting: Internal Medicine

## 2020-09-03 ENCOUNTER — Other Ambulatory Visit: Payer: Self-pay

## 2020-09-03 DIAGNOSIS — Z1231 Encounter for screening mammogram for malignant neoplasm of breast: Secondary | ICD-10-CM | POA: Diagnosis not present

## 2020-09-04 ENCOUNTER — Ambulatory Visit (INDEPENDENT_AMBULATORY_CARE_PROVIDER_SITE_OTHER): Payer: Medicare HMO | Admitting: Vascular Surgery

## 2020-09-04 ENCOUNTER — Ambulatory Visit (HOSPITAL_COMMUNITY)
Admission: RE | Admit: 2020-09-04 | Discharge: 2020-09-04 | Disposition: A | Discharge status: Home / Self Care | Payer: Medicare HMO | Source: Ambulatory Visit | Attending: Vascular Surgery | Admitting: Vascular Surgery

## 2020-09-04 ENCOUNTER — Encounter: Payer: Self-pay | Admitting: Vascular Surgery

## 2020-09-04 VITALS — BP 107/71 | HR 66 | Temp 98.2°F | Resp 20 | Ht 66.0 in | Wt 210.0 lb

## 2020-09-04 DIAGNOSIS — I714 Abdominal aortic aneurysm, without rupture, unspecified: Secondary | ICD-10-CM

## 2020-09-04 NOTE — Progress Notes (Signed)
Patient ID: [name], female   DOB: Apr 03, 1945, 75 y.o.   MRN: [id number]  Reason for Consult: Follow-up   Referred by Biagio Borg, MD  Subjective:     HPI:  [name] is a 75 y.o. female with remote history of aortic aneurysm repair in 2018.  Patient states that she has done very well from this.  She does not know of any family history of aneurysm disease.  She is a former smoker many years ago.  She has 3 children 1 son who is a smoker and 2 daughters who have never smoked.  She does not have any new back or abdominal pain.  Patient does walk with the help of a cane.  Past Medical History:  Diagnosis Date   AAA (abdominal aortic aneurysm) ([place])    a. s/p stent graft repair 01/2016.   Acute ischemic colitis ([place]) 07/29/2010   Diagnosed June, 2012 characterized by acute lower GI bleeding, spontaneously resolved.    Acute respiratory infection 06/09/2014   Acute sinus infection 05/14/2015   Allergic rhinitis, cause unspecified    Angioedema of lips 06/03/2014   Anxiety state, unspecified    Arthritis of left hip 04/16/2015   Bilateral hearing loss 07/19/2017   Chronic LBP    Coronary artery disease    a. inferior MI 1998 s/p PCI of RCA. b. stenting of Cx 04/2010. c. DES to prox LAD 05/2013. d. DES x 3 in 10/2015. // Myoview 07/2018:  EF 59, normal perfusion, low risk    Coronary artery disease involving native heart without angina pectoris 07/31/2006   Qualifier: Diagnosis of  By: [name] MD, [name]  ANGIOGRAPHIC DATA:  1. Ventriculography done in the RAO projection reveals a small wall  motion abnormality in the mid inferior wall. Ejection fraction  would be estimated at around 50%.  2. The right coronary artery has some progressive disease of about 60-  80% in the proximal mid segment. The distal vessel appears to be  50-70% narrowing althou   Degeneration of lumbar or lumbosacral intervertebral disc    Depressive disorder, not elsewhere classified    Diabetes ([place]) 11/03/2006    Qualifier: Diagnosis of  By: [name] MD, [name]    Diabetes mellitus    TYPE II   Frequent urination 05/08/2014   Gout 08/22/2013   History of endovascular stent graft for abdominal aortic aneurysm (AAA) 01/22/2016   Hyperlipidemia    Hypertension    Hypothyroidism    Iron deficiency anemia 06/13/2017   Lower GI bleed 06/2010   Diverticular bleed   Lumbar radiculopathy 05/15/2015   MENOPAUSAL DISORDER 10/29/2007   Qualifier: Diagnosis of  By: [name] MD, [name]    Myalgia 09/25/2013   Noncompliance with medications 02/26/2014   Obesity, unspecified    OTITIS MEDIA, SEROUS, CHRONIC 04/23/2007   Qualifier: Diagnosis of  By: [name] MD, [name]    Thoracic disc disease 11/19/2018   URTICARIA 09/23/2009   Qualifier: Diagnosis of  By: [name] MD, Mateo Flow A    Venous insufficiency 07/19/2017   Family History  Problem Relation Age of Onset   Heart attack Mother 28       s/p D&C-CARDIAC ARREST 1966   Heart disease Mother    Heart attack Father 47       48 WITH MI   Heart disease Father    Diabetes Brother    Diabetes Maternal Aunt    Arthritis Maternal Aunt    Breast cancer  Maternal Aunt        Post menopausal   Colon cancer Neg Hx    Past Surgical History:  Procedure Laterality Date   CARDIAC CATHETERIZATION     PCI OF BOTH THE CIRCUMFLEX AND LEFT ANTERIOR DESCENDING ARTERY   CARDIAC CATHETERIZATION N/A 10/29/2015   Procedure: Coronary Stent Intervention;  Surgeon: Nelva Bush, MD;  Location: [facility];  Service: Cardiovascular;  Laterality: N/A;   CARDIAC CATHETERIZATION N/A 10/29/2015   Procedure: Coronary/Graft Angiography;  Surgeon: Nelva Bush, MD;  Location: [facility];  Service: Cardiovascular;  Laterality: N/A;   CARDIAC CATHETERIZATION N/A 10/29/2015   Procedure: Intravascular Pressure Wire/FFR Study;  Surgeon: Nelva Bush, MD;  Location: [facility];  Service: Cardiovascular;  Laterality: N/A;   CESAREAN SECTION     ENDOVASCULAR STENT INSERTION  N/A 01/22/2016   Procedure: ABDOMINAL AORTIC ENDOVASCULAR STENT GRAFT INSERTION;  Surgeon: Rosetta Posner, MD;  Location: [facility];  Service: Vascular;  Laterality: N/A;   HEART STENT  04-2010  and  Jun 07, 2013   X 3   LEFT HEART CATH AND CORONARY ANGIOGRAPHY N/A 04/03/2019   Procedure: LEFT HEART CATH AND CORONARY ANGIOGRAPHY;  Surgeon: Nelva Bush, MD;  Location: [facility];  Service: Cardiovascular;  Laterality: N/A;   LEFT HEART CATHETERIZATION WITH CORONARY ANGIOGRAM N/A 06/07/2013   Procedure: LEFT HEART CATHETERIZATION WITH CORONARY ANGIOGRAM;  Surgeon: [name], MD;  Location: [facility];  Service: Cardiovascular;  Laterality: N/A;   LEFT HEART CATHETERIZATION WITH CORONARY ANGIOGRAM N/A 02/25/2014   Procedure: LEFT HEART CATHETERIZATION WITH CORONARY ANGIOGRAM;  Surgeon: Troy Sine, MD;  Location[facility];  Service: Cardiovascular;  Laterality: N/A;   LUMBAR FUSION  01/2007   DR. Patrice Paradise...3-LEVEL WITH FIXATION   OOPHORECTOMY     BSO? pt.unsure   PARATHYROIDECTOMY     SPINE SURGERY     THYROIDECTOMY     TOTAL ABDOMINAL HYSTERECTOMY      Short Social History:  Social History   Tobacco Use   Smoking status: Former    Years: 30.00    Types: Cigarettes    Quit date: 05/31/1986    Years since quitting: 34.2   Smokeless tobacco: Never  Substance Use Topics   Alcohol use: No    Allergies  Allergen Reactions   Crestor [Rosuvastatin Calcium] Other (See Comments)    Feeling poor   Prilosec [Omeprazole] Other (See Comments)    Chest pain   Miconazole Nitrate Hives    REACTION: hives   Augmentin [Amoxicillin-Pot Clavulanate] Hives, Itching and Rash    Has patient had a PCN reaction causing immediate rash, facial/tongue/throat swelling, SOB or lightheadedness with hypotension:unsure Has patient had a PCN reaction causing severe rash involving mucus membranes or skin necrosis:unsure Has patient had a PCN reaction that required hospitalization:No Has  patient had a PCN reaction occurring within the last 10 years:NO If all of the above answers are "NO", then may proceed with Cephalosporin use. Has patient had a PCN reaction causing immediate rash, facial/tongue/throat swelling, SOB or lightheadedness with hypotension:unsure Has patient had a PCN reaction causing severe rash involving mucus membranes or skin necrosis:unsure Has patient had a PCN reaction that required hospitalization:No Has patient had a PCN reaction occurring within the last 10 years:NO If all of the above answers are "NO", then may proceed with Cephalosporin use.    Doxycycline Other (See Comments)    REACTION: gi upset    Current Outpatient Medications  Medication Sig Dispense Refill   allopurinol (ZYLOPRIM) 300 MG tablet Take 1 tablet (300 mg total) by mouth as needed (for Gout). 90 tablet 3   aspirin 81 MG EC tablet Take 81 mg by mouth daily.      atenolol (TENORMIN) 25 MG tablet Take 1 tablet (25 mg total) by mouth daily. TAKE 1 TABLET TWICE DAILY 180 tablet 0   atorvastatin (LIPITOR) 80 MG tablet Take 1 tablet (80 mg total) by mouth daily. 30 tablet 11   azithromycin (ZITHROMAX) 250 MG tablet 2 tab by mouth day 1, then 1 per day 6 tablet 1   celecoxib (CELEBREX) 200 MG capsule Take 1 capsule (200 mg total) by mouth 2 (two) times daily as needed for moderate pain. 180 capsule 1   colchicine 0.6 MG tablet Take 1 tablet (0.6 mg total) by mouth daily as needed (gout pain). 30 tablet 2   cyclobenzaprine (FLEXERIL) 5 MG tablet Take 1 tablet (5 mg total) by mouth 3 (three) times daily as needed for muscle spasms. 30 tablet 2   docusate sodium (COLACE) 100 MG capsule Take 1 capsule (100 mg total) by mouth 2 (two) times daily as needed for mild constipation. 30 capsule 1   ezetimibe (ZETIA) 10 MG tablet Take 1 tablet (10 mg total) by mouth daily. 90 tablet 1   fexofenadine (ALLEGRA) 180 MG tablet Take 1 tablet (180 mg total) by mouth as needed for allergies or rhinitis. 90  tablet 3   gabapentin (NEURONTIN) 300 MG capsule Take 300 mg by mouth as needed (pain).     hydrochlorothiazide (MICROZIDE) 12.5 MG capsule Take 1 capsule (12.5 mg total) by mouth daily. 90 capsule 3   hydrocortisone 1 % lotion Apply 1 application topically 2 (two) times daily. Apply to affected area 96 g 0   indomethacin (INDOCIN) 50 MG capsule Take 1 capsule (50 mg total) by mouth 3 (three) times daily as needed (for gout attack). 90 capsule 3   metFORMIN (GLUCOPHAGE-XR) 500 MG 24 hr tablet      nitroGLYCERIN (NITROSTAT) 0.4 MG SL tablet Place 1 tablet (0.4 mg total) under the tongue every 5 (five) minutes as needed for chest pain. 25 tablet 3   oxyCODONE (OXY IR/ROXICODONE) 5 MG immediate release tablet 4 (four) times daily as needed.     pantoprazole (PROTONIX) 40 MG tablet Take 40 mg by mouth as needed (heartburn acid reflux).      pregabalin (LYRICA) 75 MG capsule Take 75 mg by mouth daily as needed (pain).      SYNTHROID 150 MCG tablet TAKE 1 TABLET(150 MCG) BY MOUTH DAILY 90 tablet 0   tiZANidine (ZANAFLEX) 2 MG tablet Take 1 tablet (2 mg total) by mouth every 6 (six) hours as needed for muscle spasms. 40 tablet 1   triamcinolone (NASACORT) 55 MCG/ACT AERO nasal inhaler Place 2 sprays into the nose as needed (congestion). 1 each 12   vitamin B-12 (CYANOCOBALAMIN) 1000 MCG tablet Take 1 tablet (1,000 mcg total) by mouth daily. 90 tablet 3   Cholecalciferol (THERA-D 2000) 50 MCG (2000 UT) TABS 1 tab by mouth once daily (Patient not taking: No sig reported) 90 tablet 99   isosorbide mononitrate (IMDUR) 30 MG 24 hr tablet Take 0.5 tablets (15 mg total) by mouth daily. 45 tablet 3   No current facility-administered medications for this visit.    Review of Systems  Constitutional:  Constitutional negative. HENT: HENT negative.  Eyes: Eyes negative.  Respiratory: Respiratory negative.  Cardiovascular: Cardiovascular  negative.  GI: Gastrointestinal negative.  Musculoskeletal:  Musculoskeletal negative.  Skin: Skin negative.  Neurological: Neurological negative. Hematologic: Hematologic/lymphatic negative.  Psychiatric: Psychiatric negative.       Objective:  Objective   Vitals:   09/04/20 0902  BP: 107/71  Pulse: 66  Resp: 20  Temp: 98.2 F (36.8 C)  SpO2: 93%  Weight: 210 lb (95.3 kg)  Height: '5\' 6"'$  (1.676 m)   Body mass index is 33.89 kg/m.  Physical Exam HENT:     Head: Normocephalic.     Nose:     Comments: Wearing a mask Eyes:     Pupils: Pupils are equal, round, and reactive to light.  Neck:     Vascular: No carotid bruit.  Cardiovascular:     Rate and Rhythm: Normal rate.     Pulses:          Radial pulses are 2+ on the right side and 2+ on the left side.       Popliteal pulses are 2+ on the right side and 2+ on the left side.     Comments: Popliteal arteries are normal size Abdominal:     General: Abdomen is flat.     Palpations: Abdomen is soft.  Musculoskeletal:        General: Normal range of motion.     Cervical back: Neck supple.  Skin:    General: Skin is warm.  Neurological:     General: No focal deficit present.     Mental Status: She is alert.  Psychiatric:        Mood and Affect: Mood normal.        Behavior: Behavior normal.        Thought Content: Thought content normal.        Judgment: Judgment normal.    Data: Endovascular Aortic Repair (EVAR):  +----------+----------------+-------------------+-------------------+            Diameter AP (cm)Diameter Trans (cm)Velocities (cm/sec)  +----------+----------------+-------------------+-------------------+  Aorta     3.80            4.20               45                   +----------+----------------+-------------------+-------------------+  Right Limb1.27            1.43               53                   +----------+----------------+-------------------+-------------------+  Left Limb 1.16                               66                    +----------+----------------+-------------------+-------------------+   Summary:  Patent endovascular aneurysm repair with no evidence of endoleak.      Assessment/Plan:     75yo female status post endovascular aortic aneurysm repair in 2018.  She has done very well from this.  She will continue yearly follow-up with duplex.     Waynetta Sandy MD [facility]

## 2020-09-10 DIAGNOSIS — M47814 Spondylosis without myelopathy or radiculopathy, thoracic region: Secondary | ICD-10-CM | POA: Diagnosis not present

## 2020-09-10 DIAGNOSIS — M4325 Fusion of spine, thoracolumbar region: Secondary | ICD-10-CM | POA: Diagnosis not present

## 2020-09-16 ENCOUNTER — Ambulatory Visit: Payer: Medicare HMO | Admitting: Vascular Surgery

## 2020-09-16 ENCOUNTER — Other Ambulatory Visit: Payer: Medicare HMO

## 2020-09-17 ENCOUNTER — Ambulatory Visit (INDEPENDENT_AMBULATORY_CARE_PROVIDER_SITE_OTHER): Payer: Medicare HMO | Admitting: Internal Medicine

## 2020-09-17 ENCOUNTER — Other Ambulatory Visit: Payer: Self-pay

## 2020-09-17 ENCOUNTER — Encounter: Payer: Self-pay | Admitting: Internal Medicine

## 2020-09-17 VITALS — BP 102/70 | HR 65 | Temp 98.1°F | Ht 66.0 in | Wt 209.0 lb

## 2020-09-17 DIAGNOSIS — Z1211 Encounter for screening for malignant neoplasm of colon: Secondary | ICD-10-CM | POA: Diagnosis not present

## 2020-09-17 DIAGNOSIS — Z23 Encounter for immunization: Secondary | ICD-10-CM | POA: Diagnosis not present

## 2020-09-17 DIAGNOSIS — I10 Essential (primary) hypertension: Secondary | ICD-10-CM | POA: Diagnosis not present

## 2020-09-17 DIAGNOSIS — R591 Generalized enlarged lymph nodes: Secondary | ICD-10-CM

## 2020-09-17 DIAGNOSIS — F411 Generalized anxiety disorder: Secondary | ICD-10-CM | POA: Diagnosis not present

## 2020-09-17 DIAGNOSIS — E1165 Type 2 diabetes mellitus with hyperglycemia: Secondary | ICD-10-CM | POA: Diagnosis not present

## 2020-09-17 MED ORDER — TRAMADOL HCL 50 MG PO TABS: 50.0000 mg | ORAL_TABLET | Freq: Four times a day (QID) | ORAL | 0 refills | Status: DC | PRN

## 2020-09-17 MED ORDER — LEVOFLOXACIN 500 MG PO TABS: 500.0000 mg | ORAL_TABLET | Freq: Every day | ORAL | 0 refills | Status: AC

## 2020-09-17 MED ORDER — KETOROLAC TROMETHAMINE 30 MG/ML IJ SOLN: 30.0000 mg | Freq: Once | INTRAMUSCULAR | Status: DC

## 2020-09-17 MED ORDER — KETOROLAC TROMETHAMINE 30 MG/ML IJ SOLN
30.0000 mg | Freq: Once | INTRAMUSCULAR | Status: AC
  Administered 2020-09-17: 30 mg via INTRAMUSCULAR

## 2020-09-17 NOTE — Progress Notes (Signed)
Patient ID: [name], female   DOB: 1945/06/22, 75 y.o.   MRN: [id number]        Chief Complaint: follow up right facial/jaw pain swelling for 2 days       HPI:  [name] is a 75 y.o. female here with c/o right facial pain and swelling x 2 days for unclear reasons, seems to start at the right ear but no hearing loss, vertigo, tinnitus or balance issue.  + fever but no overt sinus pain, pressure or congestion or dental issue such as pain, ulcer or drainage. No trauma or falls   Feels ill.  Pt denies chest pain, increased sob or doe, wheezing, orthopnea, PND, increased LE swelling, palpitations, dizziness or syncope.   Pt denies polydipsia, polyuria, or new focal neuro s/s.   Pt denies chills, wt loss, night sweats, loss of appetite, or other constitutional symptoms   Current pain about 8/10  incidentally due for flu shot, and pt state she is willing for colonoscopy f/u now.  Denies worsening depressive symptoms, suicidal ideation, or panic;       Wt Readings from Last 3 Encounters:  09/17/20 209 lb (94.8 kg)  09/04/20 210 lb (95.3 kg)  07/06/20 216 lb (98 kg)   BP Readings from Last 3 Encounters:  09/17/20 102/70  09/04/20 107/71  07/06/20 138/82         Past Medical History:  Diagnosis Date   AAA (abdominal aortic aneurysm) ([place])    a. s/p stent graft repair 01/2016.   Acute ischemic colitis ([place]) 07/29/2010   Diagnosed June, 2012 characterized by acute lower GI bleeding, spontaneously resolved.    Acute respiratory infection 06/09/2014   Acute sinus infection 05/14/2015   Allergic rhinitis, cause unspecified    Angioedema of lips 06/03/2014   Anxiety state, unspecified    Arthritis of left hip 04/16/2015   Bilateral hearing loss 07/19/2017   Chronic LBP    Coronary artery disease    a. inferior MI 1998 s/p PCI of RCA. b. stenting of Cx 04/2010. c. DES to prox LAD 05/2013. d. DES x 3 in 10/2015. // Myoview 07/2018:  EF 59, normal perfusion, low risk    Coronary artery disease  involving native heart without angina pectoris 07/31/2006   Qualifier: Diagnosis of  By: [name] MD, [name]  ANGIOGRAPHIC DATA:  1. Ventriculography done in the RAO projection reveals a small wall  motion abnormality in the mid inferior wall. Ejection fraction  would be estimated at around 50%.  2. The right coronary artery has some progressive disease of about 60-  80% in the proximal mid segment. The distal vessel appears to be  50-70% narrowing althou   Degeneration of lumbar or lumbosacral intervertebral disc    Depressive disorder, not elsewhere classified    Diabetes ([place]) 11/03/2006   Qualifier: Diagnosis of  By: [name] MD, [name]    Diabetes mellitus    TYPE II   Frequent urination 05/08/2014   Gout 08/22/2013   History of endovascular stent graft for abdominal aortic aneurysm (AAA) 01/22/2016   Hyperlipidemia    Hypertension    Hypothyroidism    Iron deficiency anemia 06/13/2017   Lower GI bleed 06/2010   Diverticular bleed   Lumbar radiculopathy 05/15/2015   MENOPAUSAL DISORDER 10/29/2007   Qualifier: Diagnosis of  By: [name] MD, [name]    Myalgia 09/25/2013   Noncompliance with medications 02/26/2014   Obesity, unspecified    OTITIS MEDIA, SEROUS, CHRONIC 04/23/2007  Qualifier: Diagnosis of  By: [name] MD, [name]    Thoracic disc disease 11/19/2018   URTICARIA 09/23/2009   Qualifier: Diagnosis of  By: [name] MD, Mateo Flow A    Venous insufficiency 07/19/2017   Past Surgical History:  Procedure Laterality Date   CARDIAC CATHETERIZATION     PCI OF BOTH THE CIRCUMFLEX AND LEFT ANTERIOR DESCENDING ARTERY   CARDIAC CATHETERIZATION N/A 10/29/2015   Procedure: Coronary Stent Intervention;  Surgeon: Nelva Bush, MD;  Location: [place] [facility];  Service: Cardiovascular;  Laterality: N/A;   CARDIAC CATHETERIZATION N/A 10/29/2015   Procedure: Coronary/Graft Angiography;  Surgeon: Nelva Bush, MD;  Location: [place] [facility];  Service: Cardiovascular;  Laterality: N/A;   CARDIAC  CATHETERIZATION N/A 10/29/2015   Procedure: Intravascular Pressure Wire/FFR Study;  Surgeon: Nelva Bush, MD;  Location: [place] [facility];  Service: Cardiovascular;  Laterality: N/A;   CESAREAN SECTION     ENDOVASCULAR STENT INSERTION N/A 01/22/2016   Procedure: ABDOMINAL AORTIC ENDOVASCULAR STENT GRAFT INSERTION;  Surgeon: Rosetta Posner, MD;  Location: [facility];  Service: Vascular;  Laterality: N/A;   HEART STENT  04-2010  and  Jun 07, 2013   X 3   LEFT HEART CATH AND CORONARY ANGIOGRAPHY N/A 04/03/2019   Procedure: LEFT HEART CATH AND CORONARY ANGIOGRAPHY;  Surgeon: Nelva Bush, MD;  Location: [place] [facility];  Service: Cardiovascular;  Laterality: N/A;   LEFT HEART CATHETERIZATION WITH CORONARY ANGIOGRAM N/A 06/07/2013   Procedure: LEFT HEART CATHETERIZATION WITH CORONARY ANGIOGRAM;  Surgeon: [name], MD;  Location: [facility];  Service: Cardiovascular;  Laterality: N/A;   LEFT HEART CATHETERIZATION WITH CORONARY ANGIOGRAM N/A 02/25/2014   Procedure: LEFT HEART CATHETERIZATION WITH CORONARY ANGIOGRAM;  Surgeon: Troy Sine, MD;  Location: [facility] CATH LAB;  Service: Cardiovascular;  Laterality: N/A;   LUMBAR FUSION  01/2007   DR. Patrice Paradise...3-LEVEL WITH FIXATION   OOPHORECTOMY     BSO? pt.unsure   PARATHYROIDECTOMY     SPINE SURGERY     THYROIDECTOMY     TOTAL ABDOMINAL HYSTERECTOMY      reports that she quit smoking about 34 years ago. Her smoking use included cigarettes. She has never used smokeless tobacco. She reports that she does not drink alcohol and does not use drugs. family history includes Arthritis in her maternal aunt; Breast cancer in her maternal aunt; Diabetes in her brother and maternal aunt; Heart attack (age of onset: 67) in her mother; Heart attack (age of onset: 41) in her father; Heart disease in her father and mother. Allergies  Allergen Reactions   Crestor [Rosuvastatin Calcium] Other (See Comments)    Feeling poor   Prilosec [Omeprazole] Other (See  Comments)    Chest pain   Miconazole Nitrate Hives    REACTION: hives   Augmentin [Amoxicillin-Pot Clavulanate] Hives, Itching and Rash    Has patient had a PCN reaction causing immediate rash, facial/tongue/throat swelling, SOB or lightheadedness with hypotension:unsure Has patient had a PCN reaction causing severe rash involving mucus membranes or skin necrosis:unsure Has patient had a PCN reaction that required hospitalization:No Has patient had a PCN reaction occurring within the last 10 years:NO If all of the above answers are "NO", then may proceed with Cephalosporin use. Has patient had a PCN reaction causing immediate rash, facial/tongue/throat swelling, SOB or lightheadedness with hypotension:unsure Has patient had a PCN reaction causing severe rash involving mucus membranes or skin necrosis:unsure Has patient had a PCN reaction that required hospitalization:No Has patient  had a PCN reaction occurring within the last 10 years:NO If all of the above answers are "NO", then may proceed with Cephalosporin use.    Doxycycline Other (See Comments)    REACTION: gi upset   Current Outpatient Medications on File Prior to Visit  Medication Sig Dispense Refill   allopurinol (ZYLOPRIM) 300 MG tablet Take 1 tablet (300 mg total) by mouth as needed (for Gout). 90 tablet 3   aspirin 81 MG EC tablet Take 81 mg by mouth daily.      atenolol (TENORMIN) 25 MG tablet Take 1 tablet (25 mg total) by mouth daily. TAKE 1 TABLET TWICE DAILY 180 tablet 0   atorvastatin (LIPITOR) 80 MG tablet Take 1 tablet (80 mg total) by mouth daily. 30 tablet 11   azithromycin (ZITHROMAX) 250 MG tablet 2 tab by mouth day 1, then 1 per day 6 tablet 1   celecoxib (CELEBREX) 200 MG capsule Take 1 capsule (200 mg total) by mouth 2 (two) times daily as needed for moderate pain. 180 capsule 1   Cholecalciferol (THERA-D 2000) 50 MCG (2000 UT) TABS 1 tab by mouth once daily 90 tablet 99   colchicine 0.6 MG tablet Take 1 tablet  (0.6 mg total) by mouth daily as needed (gout pain). 30 tablet 2   cyclobenzaprine (FLEXERIL) 5 MG tablet Take 1 tablet (5 mg total) by mouth 3 (three) times daily as needed for muscle spasms. 30 tablet 2   docusate sodium (COLACE) 100 MG capsule Take 1 capsule (100 mg total) by mouth 2 (two) times daily as needed for mild constipation. 30 capsule 1   ezetimibe (ZETIA) 10 MG tablet Take 1 tablet (10 mg total) by mouth daily. 90 tablet 1   fexofenadine (ALLEGRA) 180 MG tablet Take 1 tablet (180 mg total) by mouth as needed for allergies or rhinitis. 90 tablet 3   gabapentin (NEURONTIN) 300 MG capsule Take 300 mg by mouth as needed (pain).     hydrochlorothiazide (MICROZIDE) 12.5 MG capsule Take 1 capsule (12.5 mg total) by mouth daily. 90 capsule 3   hydrocortisone 1 % lotion Apply 1 application topically 2 (two) times daily. Apply to affected area 96 g 0   indomethacin (INDOCIN) 50 MG capsule Take 1 capsule (50 mg total) by mouth 3 (three) times daily as needed (for gout attack). 90 capsule 3   metFORMIN (GLUCOPHAGE-XR) 500 MG 24 hr tablet      nitroGLYCERIN (NITROSTAT) 0.4 MG SL tablet Place 1 tablet (0.4 mg total) under the tongue every 5 (five) minutes as needed for chest pain. 25 tablet 3   oxyCODONE (OXY IR/ROXICODONE) 5 MG immediate release tablet 4 (four) times daily as needed.     pantoprazole (PROTONIX) 40 MG tablet Take 40 mg by mouth as needed (heartburn acid reflux).      pregabalin (LYRICA) 75 MG capsule Take 75 mg by mouth daily as needed (pain).      SYNTHROID 150 MCG tablet TAKE 1 TABLET(150 MCG) BY MOUTH DAILY 90 tablet 0   tiZANidine (ZANAFLEX) 2 MG tablet Take 1 tablet (2 mg total) by mouth every 6 (six) hours as needed for muscle spasms. 40 tablet 1   triamcinolone (NASACORT) 55 MCG/ACT AERO nasal inhaler Place 2 sprays into the nose as needed (congestion). 1 each 12   vitamin B-12 (CYANOCOBALAMIN) 1000 MCG tablet Take 1 tablet (1,000 mcg total) by mouth daily. 90 tablet 3    isosorbide mononitrate (IMDUR) 30 MG 24 hr tablet Take 0.5 tablets (  15 mg total) by mouth daily. 45 tablet 3   No current facility-administered medications on file prior to visit.        ROS:  All others reviewed and negative.  Objective        PE:  BP 102/70   Pulse 65   Temp 98.1 F (36.7 C) (Oral)   Ht '5\' 6"'$  (1.676 m)   Wt 209 lb (94.8 kg)   SpO2 94%   BMI 33.73 kg/m                 Constitutional: Pt appears in NAD               HENT: Head: NCAT.                Right Ear: External ear normal.  TM and canal clear without redness or swelling or drainage                Right preauricular area and angle of jaw and somewhat more submandibular is mod enlarged tender LA, mobile, without overlying skin change; sinus nontender, no acute dental swelling or ulcer or teeth issue noted               Left Ear: External ear normal.                Eyes: . Pupils are equal, round, and reactive to light. Conjunctivae and EOM are normal               Nose: without d/c or deformity               Neck: Neck supple. Gross normal ROM               Cardiovascular: Normal rate and regular rhythm.                 Pulmonary/Chest: Effort normal and breath sounds without rales or wheezing.                               Neurological: Pt is alert. At baseline orientation, motor grossly intact               Skin: Skin is warm. No rashes, no other new lesions, LE edema - none               Psychiatric: Pt behavior is normal without agitation   Micro: none  Cardiac tracings I have personally interpreted today:  none  Pertinent Radiological findings (summarize): none   Lab Results  Component Value Date   WBC 6.8 03/19/2020   HGB 14.3 03/19/2020   HCT 44.1 03/19/2020   PLT 297.0 03/19/2020   GLUCOSE 105 (H) 07/30/2020   CHOL 169 07/30/2020   TRIG 122 07/30/2020   HDL 36 (L) 07/30/2020   LDLDIRECT 131.0 03/27/2015   LDLCALC 111 (H) 07/30/2020   ALT 16 07/30/2020   AST 18 07/30/2020   NA 143  07/30/2020   K 4.2 07/30/2020   CL 101 07/30/2020   CREATININE 0.90 07/30/2020   BUN 6 (L) 07/30/2020   CO2 25 07/30/2020   TSH 2.32 03/19/2020   INR 1.08 01/22/2016   HGBA1C 6.3 03/19/2020   MICROALBUR 0.8 03/19/2020   Assessment/Plan:  Ellene HEAVEN ENRIQUES is a 75 y.o. Black or African American [2] female with  has a past medical history of AAA (abdominal aortic aneurysm) ([place]), Acute ischemic colitis ([place]) (07/29/2010),  Acute respiratory infection (06/09/2014), Acute sinus infection (05/14/2015), Allergic rhinitis, cause unspecified, Angioedema of lips (06/03/2014), Anxiety state, unspecified, Arthritis of left hip (04/16/2015), Bilateral hearing loss (07/19/2017), Chronic LBP, Coronary artery disease, Coronary artery disease involving native heart without angina pectoris (07/31/2006), Degeneration of lumbar or lumbosacral intervertebral disc, Depressive disorder, not elsewhere classified, Diabetes ([place]) (11/03/2006), Diabetes mellitus, Frequent urination (05/08/2014), Gout (08/22/2013), History of endovascular stent graft for abdominal aortic aneurysm (AAA) (01/22/2016), Hyperlipidemia, Hypertension, Hypothyroidism, Iron deficiency anemia (06/13/2017), Lower GI bleed (06/2010), Lumbar radiculopathy (05/15/2015), MENOPAUSAL DISORDER (10/29/2007), Myalgia (09/25/2013), Noncompliance with medications (02/26/2014), Obesity, unspecified, OTITIS MEDIA, SEROUS, CHRONIC (04/23/2007), Thoracic disc disease (11/19/2018), URTICARIA (09/23/2009), and Venous insufficiency (07/19/2017).  Lymphadenopathy of head and neck C/w lymphadenitis, etiology unclear, but for toradol 30 mg im, levaquin asd, and tramadol prn pain  Essential hypertension BP Readings from Last 3 Encounters:  09/17/20 102/70  09/04/20 107/71  07/06/20 138/82   Low normal, asympt, pt to continue medical treatment tenormin, hct   Diabetes ([place]) Lab Results  Component Value Date   HGBA1C 6.3 03/19/2020   Stable, pt to continue current medical treatment  metformin   Anxiety state Mild situational today, pt to continue current med tx  - reassurance  Followup: Return in about 4 months (around 01/17/2021).  [name], MD 09/21/2020 12:18 PM [facility]

## 2020-09-17 NOTE — Patient Instructions (Addendum)
You had the flu shot today. And the pain shot (toradol)  Please take all new medication as prescribed - the antibiotic, and tramadol for pain  Please continue all other medications as before, and refills have been done if requested.  Please have the pharmacy call with any other refills you may need.  Please continue your efforts at being more active, low cholesterol diet, and weight control.  Please keep your appointments with your specialists as you may have planned  You will be contacted regarding the referral for: colonoscopy  Please make an Appointment to return in 4 months

## 2020-09-21 ENCOUNTER — Encounter: Payer: Self-pay | Admitting: Internal Medicine

## 2020-09-21 NOTE — Assessment & Plan Note (Signed)
BP Readings from Last 3 Encounters:  09/17/20 102/70  09/04/20 107/71  07/06/20 138/82   Low normal, asympt, pt to continue medical treatment tenormin, hct

## 2020-09-21 NOTE — Assessment & Plan Note (Signed)
Mild situational today, pt to continue current med tx  - reassurance

## 2020-09-21 NOTE — Assessment & Plan Note (Signed)
C/w lymphadenitis, etiology unclear, but for toradol 30 mg im, levaquin asd, and tramadol prn pain

## 2020-09-21 NOTE — Assessment & Plan Note (Signed)
Lab Results  Component Value Date   HGBA1C 6.3 03/19/2020   Stable, pt to continue current medical treatment metformin

## 2020-09-29 DIAGNOSIS — M47812 Spondylosis without myelopathy or radiculopathy, cervical region: Secondary | ICD-10-CM | POA: Diagnosis not present

## 2020-10-01 ENCOUNTER — Encounter: Payer: Self-pay | Admitting: Family Medicine

## 2020-10-01 ENCOUNTER — Ambulatory Visit: Payer: Self-pay

## 2020-10-01 ENCOUNTER — Ambulatory Visit (INDEPENDENT_AMBULATORY_CARE_PROVIDER_SITE_OTHER): Payer: Medicare HMO | Admitting: Family Medicine

## 2020-10-01 ENCOUNTER — Other Ambulatory Visit: Payer: Self-pay

## 2020-10-01 VITALS — BP 102/72 | HR 84 | Ht 66.0 in | Wt 208.6 lb

## 2020-10-01 DIAGNOSIS — M25562 Pain in left knee: Secondary | ICD-10-CM

## 2020-10-01 DIAGNOSIS — M170 Bilateral primary osteoarthritis of knee: Secondary | ICD-10-CM | POA: Diagnosis not present

## 2020-10-01 DIAGNOSIS — G8929 Other chronic pain: Secondary | ICD-10-CM | POA: Diagnosis not present

## 2020-10-01 DIAGNOSIS — M25561 Pain in right knee: Secondary | ICD-10-CM

## 2020-10-01 NOTE — Progress Notes (Signed)
I, [name], LAT, ATC acting as a scribe for Lynne Leader, MD.  [name] is a 75 y.o. female who presents to [facility] today for continued chronic bilat knee pain due to severe DJD. Pt was last seen by Dr. Georgina Snell on 07/06/20 and was given bilat knee steroid injections. Today, pt reports that her last injections lasted for approximately one month and then knee pain has gradually worsened.  Dx imaging: 11/21/19 R & L knee XR  Pertinent review of systems: No fevers or chills  Relevant historical information: Hypertension.  Severe DJD both knees.   Exam:  BP 102/72 (BP Location: Left Arm, Patient Position: Sitting, Cuff Size: Normal)   Pulse 84   Ht '5\' 6"'$  (1.676 m)   Wt 208 lb 9.6 oz (94.6 kg)   SpO2 96%   BMI 33.67 kg/m  General: Well Developed, well nourished, and in no acute distress.   MSK: Right knee moderate effusion normal motion with crepitation. Left knee moderate effusion normal motion with crepitation.    Lab and Radiology Results  Procedure: Real-time Ultrasound Guided Injection of right knee superior lateral patellar space Device: Philips Affiniti 50G Images permanently stored and available for review in PACS Verbal informed consent obtained.  Discussed risks and benefits of procedure. Warned about infection bleeding damage to structures skin hypopigmentation and fat atrophy among others. Patient expresses understanding and agreement Time-out conducted.   Noted no overlying erythema, induration, or other signs of local infection.   Skin prepped in a sterile fashion.   Local anesthesia: Topical Ethyl chloride.   With sterile technique and under real time ultrasound guidance: 40 mg of Kenalog and 2 mL of lidocaine injected into knee joint. Fluid seen entering the joint capsule.   Completed without difficulty   Pain immediately resolved suggesting accurate placement of the medication.   Advised to call if fevers/chills,  erythema, induration, drainage, or persistent bleeding.   Images permanently stored and available for review in the ultrasound unit.  Impression: Technically successful ultrasound guided injection.    Procedure: Real-time Ultrasound Guided Injection of left knee superior lateral patellar space Device: Philips Affiniti 50G Images permanently stored and available for review in PACS Verbal informed consent obtained.  Discussed risks and benefits of procedure. Warned about infection bleeding damage to structures skin hypopigmentation and fat atrophy among others. Patient expresses understanding and agreement Time-out conducted.   Noted no overlying erythema, induration, or other signs of local infection.   Skin prepped in a sterile fashion.   Local anesthesia: Topical Ethyl chloride.   With sterile technique and under real time ultrasound guidance: 40 mg of Kenalog and 2 mL of Marcaine injected into knee joint. Fluid seen entering the joint capsule.   Completed without difficulty   Pain immediately resolved suggesting accurate placement of the medication.   Advised to call if fevers/chills, erythema, induration, drainage, or persistent bleeding.   Images permanently stored and available for review in the ultrasound unit.  Impression: Technically successful ultrasound guided injection.    EXAM: LEFT KNEE 3 VIEWS; RIGHT KNEE 3 VIEWS   COMPARISON:  None.   FINDINGS: No fracture or dislocation of the bilateral knees. There is severe, symmetric bilateral tricompartmental arthrosis of the knees, with near total bone-on-bone joint space loss, most severe in the patellofemoral compartments bilaterally., of note, there are multiple calcified loose bodies within the superior joint space of the left knee and within the posterior joint spaces of the bilateral knees.  No knee joint effusion. Vascular calcinosis.   IMPRESSION: 1.  No fracture or dislocation of the bilateral knees.   2. There is  severe, symmetric bilateral tricompartmental arthrosis of the knees, with near total bone-on-bone joint space loss, most severe in the patellofemoral compartments bilaterally.   3. There are multiple calcified loose bodies within the superior joint space of the left knee and within the posterior joint spaces of the bilateral knees.     Electronically Signed   By: Eddie Candle M.D.   On: 11/23/2019 15:50  EXAM: LEFT KNEE 3 VIEWS; RIGHT KNEE 3 VIEWS   COMPARISON:  None.   FINDINGS: No fracture or dislocation of the bilateral knees. There is severe, symmetric bilateral tricompartmental arthrosis of the knees, with near total bone-on-bone joint space loss, most severe in the patellofemoral compartments bilaterally., of note, there are multiple calcified loose bodies within the superior joint space of the left knee and within the posterior joint spaces of the bilateral knees. No knee joint effusion. Vascular calcinosis.   IMPRESSION: 1.  No fracture or dislocation of the bilateral knees.   2. There is severe, symmetric bilateral tricompartmental arthrosis of the knees, with near total bone-on-bone joint space loss, most severe in the patellofemoral compartments bilaterally.   3. There are multiple calcified loose bodies within the superior joint space of the left knee and within the posterior joint spaces of the bilateral knees.     Electronically Signed   By: Eddie Candle M.D.   On: 11/23/2019 15:50  I, Lynne Leader, personally (independently) visualized and performed the interpretation of the images attached in this note.     Assessment and Plan: 75 y.o. female with bilateral knee pain due to DJD.  Patient has severe bilateral DJD.  Plan for repeat steroid injections today.  Last injection was about 3 months ago.  Ultimately she needs a knee replacement but she is not ready to consider that right now.  Reschedule for about 3 months from now for repeat steroid injection  very likely.  Continue quad strengthening exercises.   PDMP not reviewed this encounter. Orders Placed This Encounter  Procedures   [place] LIMITED JOINT SPACE STRUCTURES LOW BILAT(NO LINKED CHARGES)    Order Specific Question:   Reason for Exam (SYMPTOM  OR DIAGNOSIS REQUIRED)    Answer:   B knee pain    Order Specific Question:   Preferred imaging location?    Answer:   [place]   No orders of the defined types were placed in this encounter.    Discussed warning signs or symptoms. Please see discharge instructions. Patient expresses understanding.   The above documentation has been reviewed and is accurate and complete Lynne Leader, M.D.

## 2020-10-01 NOTE — Patient Instructions (Addendum)
Good to see you today.  You had B knee injections.  Call or go to the ER if you develop a large red swollen joint with extreme pain or oozing puss.   Please schedule a 3 month follow-up and we can repeat the knee injections.  I'll be out of the office Dec 16-20th so schedule with me either right before or right after that.

## 2020-10-24 ENCOUNTER — Other Ambulatory Visit: Payer: Self-pay

## 2020-10-24 ENCOUNTER — Encounter (HOSPITAL_COMMUNITY): Payer: Self-pay

## 2020-10-24 ENCOUNTER — Emergency Department (HOSPITAL_COMMUNITY)
Admission: EM | Admit: 2020-10-24 | Discharge: 2020-10-24 | Disposition: A | Discharge status: Home / Self Care | Payer: Medicare HMO | Attending: Emergency Medicine | Admitting: Emergency Medicine

## 2020-10-24 ENCOUNTER — Emergency Department (HOSPITAL_COMMUNITY): Payer: Medicare HMO

## 2020-10-24 DIAGNOSIS — Z7984 Long term (current) use of oral hypoglycemic drugs: Secondary | ICD-10-CM | POA: Insufficient documentation

## 2020-10-24 DIAGNOSIS — Z7982 Long term (current) use of aspirin: Secondary | ICD-10-CM | POA: Diagnosis not present

## 2020-10-24 DIAGNOSIS — Z79899 Other long term (current) drug therapy: Secondary | ICD-10-CM | POA: Diagnosis not present

## 2020-10-24 DIAGNOSIS — I2510 Atherosclerotic heart disease of native coronary artery without angina pectoris: Secondary | ICD-10-CM | POA: Diagnosis not present

## 2020-10-24 DIAGNOSIS — M25532 Pain in left wrist: Secondary | ICD-10-CM | POA: Diagnosis not present

## 2020-10-24 DIAGNOSIS — I10 Essential (primary) hypertension: Secondary | ICD-10-CM | POA: Insufficient documentation

## 2020-10-24 DIAGNOSIS — M7989 Other specified soft tissue disorders: Secondary | ICD-10-CM | POA: Diagnosis not present

## 2020-10-24 DIAGNOSIS — E039 Hypothyroidism, unspecified: Secondary | ICD-10-CM | POA: Diagnosis not present

## 2020-10-24 DIAGNOSIS — Z87891 Personal history of nicotine dependence: Secondary | ICD-10-CM | POA: Diagnosis not present

## 2020-10-24 DIAGNOSIS — M25539 Pain in unspecified wrist: Secondary | ICD-10-CM | POA: Diagnosis not present

## 2020-10-24 DIAGNOSIS — E119 Type 2 diabetes mellitus without complications: Secondary | ICD-10-CM | POA: Insufficient documentation

## 2020-10-24 DIAGNOSIS — R609 Edema, unspecified: Secondary | ICD-10-CM | POA: Diagnosis not present

## 2020-10-24 LAB — CBC WITH DIFFERENTIAL/PLATELET
Abs Immature Granulocytes: 0.09 10*3/uL — ABNORMAL HIGH (ref 0.00–0.07)
Basophils Absolute: 0 10*3/uL (ref 0.0–0.1)
Basophils Relative: 0 %
Eosinophils Absolute: 0 10*3/uL (ref 0.0–0.5)
Eosinophils Relative: 0 %
HCT: 46.8 % — ABNORMAL HIGH (ref 36.0–46.0)
Hemoglobin: 14.7 g/dL (ref 12.0–15.0)
Immature Granulocytes: 1 %
Lymphocytes Relative: 14 %
Lymphs Abs: 1.4 10*3/uL (ref 0.7–4.0)
MCH: 28.5 pg (ref 26.0–34.0)
MCHC: 31.4 g/dL (ref 30.0–36.0)
MCV: 90.7 fL (ref 80.0–100.0)
Monocytes Absolute: 0.8 10*3/uL (ref 0.1–1.0)
Monocytes Relative: 7 %
Neutro Abs: 8 10*3/uL — ABNORMAL HIGH (ref 1.7–7.7)
Neutrophils Relative %: 78 %
Platelets: 291 10*3/uL (ref 150–400)
RBC: 5.16 MIL/uL — ABNORMAL HIGH (ref 3.87–5.11)
RDW: 14.3 % (ref 11.5–15.5)
WBC: 10.3 10*3/uL (ref 4.0–10.5)
nRBC: 0 % (ref 0.0–0.2)

## 2020-10-24 LAB — COMPREHENSIVE METABOLIC PANEL
ALT: 14 U/L (ref 0–44)
AST: 18 U/L (ref 15–41)
Albumin: 3.8 g/dL (ref 3.5–5.0)
Alkaline Phosphatase: 91 U/L (ref 38–126)
Anion gap: 10 (ref 5–15)
BUN: 8 mg/dL (ref 8–23)
CO2: 28 mmol/L (ref 22–32)
Calcium: 9.3 mg/dL (ref 8.9–10.3)
Chloride: 105 mmol/L (ref 98–111)
Creatinine, Ser: 0.84 mg/dL (ref 0.44–1.00)
GFR, Estimated: >60 mL/min (ref >60)
Glucose, Bld: 105 mg/dL — ABNORMAL HIGH (ref 70–99)
Potassium: 4.3 mmol/L (ref 3.5–5.1)
Sodium: 143 mmol/L (ref 135–145)
Total Bilirubin: 0.8 mg/dL (ref 0.3–1.2)
Total Protein: 8.5 g/dL — ABNORMAL HIGH (ref 6.5–8.1)

## 2020-10-24 LAB — SEDIMENTATION RATE: Sed Rate: 53 mm/hr — ABNORMAL HIGH (ref 0–22)

## 2020-10-24 LAB — LACTIC ACID, PLASMA: Lactic Acid, Venous: 1.3 mmol/L (ref 0.5–1.9)

## 2020-10-24 MED ORDER — OXYCODONE-ACETAMINOPHEN 5-325 MG PO TABS: 1.0000 | ORAL_TABLET | Freq: Three times a day (TID) | ORAL | 0 refills | Status: DC | PRN

## 2020-10-24 MED ORDER — DOXYCYCLINE HYCLATE 100 MG PO CAPS: 100.0000 mg | ORAL_CAPSULE | Freq: Two times a day (BID) | ORAL | 0 refills | Status: DC

## 2020-10-24 MED ORDER — NAPROXEN 500 MG PO TABS: 500.0000 mg | ORAL_TABLET | Freq: Two times a day (BID) | ORAL | 0 refills | Status: DC

## 2020-10-24 MED ORDER — OXYCODONE-ACETAMINOPHEN 5-325 MG PO TABS
1.0000 | ORAL_TABLET | Freq: Once | ORAL | Status: AC
  Administered 2020-10-24: 1 via ORAL
  Filled 2020-10-24: qty 1

## 2020-10-24 NOTE — ED Provider Notes (Addendum)
[facility] DEPT Provider Note   CSN: [id number] Arrival date & time: 10/24/20  1802     History Chief Complaint  Patient presents with   Wrist Pain   Hand Pain    [name] is a 75 y.o. female.  HPI Patient is a 75 year old female with a medical history as noted below.  She presents to the emergency department due to atraumatic pain, swelling, redness in the left wrist started this morning around 10 AM.  Denies any new injuries or falls.  Patient does note a history of gout in her feet but states she has never experienced an exacerbation in her wrist.  She reports nausea, chills, and body aches.  No vomiting.    Past Medical History:  Diagnosis Date   AAA (abdominal aortic aneurysm)    a. s/p stent graft repair 01/2016.   Acute ischemic colitis ([place]) 07/29/2010   Diagnosed June, 2012 characterized by acute lower GI bleeding, spontaneously resolved.    Acute respiratory infection 06/09/2014   Acute sinus infection 05/14/2015   Allergic rhinitis, cause unspecified    Angioedema of lips 06/03/2014   Anxiety state, unspecified    Arthritis of left hip 04/16/2015   Bilateral hearing loss 07/19/2017   Chronic LBP    Coronary artery disease    a. inferior MI 1998 s/p PCI of RCA. b. stenting of Cx 04/2010. c. DES to prox LAD 05/2013. d. DES x 3 in 10/2015. // Myoview 07/2018:  EF 59, normal perfusion, low risk    Coronary artery disease involving native heart without angina pectoris 07/31/2006   Qualifier: Diagnosis of  By: [name] MD, [name]  ANGIOGRAPHIC DATA:  1. Ventriculography done in the RAO projection reveals a small wall  motion abnormality in the mid inferior wall. Ejection fraction  would be estimated at around 50%.  2. The right coronary artery has some progressive disease of about 60-  80% in the proximal mid segment. The distal vessel appears to be  50-70% narrowing althou   Degeneration of lumbar or lumbosacral intervertebral disc    Depressive  disorder, not elsewhere classified    Diabetes ([place]) 11/03/2006   Qualifier: Diagnosis of  By: [name] MD, [name]    Diabetes mellitus    TYPE II   Frequent urination 05/08/2014   Gout 08/22/2013   History of endovascular stent graft for abdominal aortic aneurysm (AAA) 01/22/2016   Hyperlipidemia    Hypertension    Hypothyroidism    Iron deficiency anemia 06/13/2017   Lower GI bleed 06/2010   Diverticular bleed   Lumbar radiculopathy 05/15/2015   MENOPAUSAL DISORDER 10/29/2007   Qualifier: Diagnosis of  By: [name] MD, [name]    Myalgia 09/25/2013   Noncompliance with medications 02/26/2014   Obesity, unspecified    OTITIS MEDIA, SEROUS, CHRONIC 04/23/2007   Qualifier: Diagnosis of  By: [name] MD, [name]    Thoracic disc disease 11/19/2018   URTICARIA 09/23/2009   Qualifier: Diagnosis of  By: [name] MD, Valerie A    Venous insufficiency 07/19/2017    Patient Active Problem List   Diagnosis Date Noted   Lymphadenopathy of head and neck 09/17/2020   Primary osteoarthritis of both knees 07/06/2020   Right ear pain 12/28/2019   Generalized weakness 11/16/2019   Cough 10/26/2019   Arthritis of left knee 09/19/2019   Unstable angina ([address]) 04/03/2019   Rash 11/22/2018   Thoracic disc disease 11/19/2018   Lumbar spinal stenosis 11/19/2018  Right hand paresthesia 09/15/2018   Right leg swelling 01/16/2018   Edema 10/25/2017   Venous insufficiency 07/19/2017   B12 deficiency 07/19/2017   Bilateral hearing loss 07/19/2017   Iron deficiency anemia 06/13/2017   Coronary artery disease    Bilateral swelling of feet 05/29/2017   Pharyngitis 01/11/2017   Vaginal yeast infection 07/05/2016   Lower abdominal pain 07/05/2016   Chronic low back pain 06/28/2016   Insomnia 06/28/2016   History of endovascular stent graft for abdominal aortic aneurysm (AAA) 01/22/2016   Lumbar radiculopathy 05/15/2015   Acute sinus infection 05/14/2015   Arthritis of left hip 04/16/2015   Acute gout 01/16/2015    Chronic pain of both knees 07/15/2014   Acute respiratory infection 06/09/2014   Angioedema of lips 06/03/2014   Frequent urination 05/08/2014   Acute epiglottitis without obstruction 05/05/2014   Angioedema    Neck swelling    Abdominal tenderness 03/26/2014   Noncompliance with medications 02/26/2014   AAA (abdominal aortic aneurysm) ([place])    Chest pain 02/24/2014   Jaw pain 09/25/2013   Myalgia 09/25/2013   Gout 08/22/2013   Abdominal pain, unspecified site 08/13/2013   Musculoskeletal pain 08/04/2013   Aftercare following surgery of the circulatory system, NEC 06/25/2013   Peripheral vascular disease, unspecified ([place]) 06/19/2012   Dry cough 03/05/2012   Dysuria 04/21/2011   Low back pain 10/04/2010   Acute ischemic colitis ([place]) 07/29/2010   Lower GI bleed 07/23/2010   Liver lesion 07/23/2010   Encounter for well adult exam with abnormal findings 07/22/2010   URTICARIA 09/23/2009   TOBACCO ABUSE 11/18/2008   Obesity 11/17/2008   MENOPAUSAL DISORDER 10/29/2007   Anxiety state 06/22/2007   DEPRESSION 06/22/2007   Right otitis media 04/23/2007   Constipation 02/07/2007   [place] DISEASE, LUMBOSACRAL SPINE 02/07/2007   Diabetes ([place]) 11/03/2006   Allergic rhinitis 11/03/2006   Hypothyroidism 07/31/2006   Hyperlipidemia 07/31/2006   Essential hypertension 07/31/2006    Past Surgical History:  Procedure Laterality Date   CARDIAC CATHETERIZATION     PCI OF BOTH THE CIRCUMFLEX AND LEFT ANTERIOR DESCENDING ARTERY   CARDIAC CATHETERIZATION N/A 10/29/2015   Procedure: Coronary Stent Intervention;  Surgeon: Nelva Bush, MD;  Location: [facility];  Service: Cardiovascular;  Laterality: N/A;   CARDIAC CATHETERIZATION N/A 10/29/2015   Procedure: Coronary/Graft Angiography;  Surgeon: Nelva Bush, MD;  Location: [facility];  Service: Cardiovascular;  Laterality: N/A;   CARDIAC CATHETERIZATION N/A 10/29/2015   Procedure: Intravascular Pressure Wire/FFR Study;   Surgeon: Nelva Bush, MD;  Location: [facility];  Service: Cardiovascular;  Laterality: N/A;   CESAREAN SECTION     ENDOVASCULAR STENT INSERTION N/A 01/22/2016   Procedure: ABDOMINAL AORTIC ENDOVASCULAR STENT GRAFT INSERTION;  Surgeon: Rosetta Posner, MD;  Location: [facility];  Service: Vascular;  Laterality: N/A;   HEART STENT  04-2010  and  Jun 07, 2013   X 3   LEFT HEART CATH AND CORONARY ANGIOGRAPHY N/A 04/03/2019   Procedure: LEFT HEART CATH AND CORONARY ANGIOGRAPHY;  Surgeon: Nelva Bush, MD;  Location: [facility];  Service: Cardiovascular;  Laterality: N/A;   LEFT HEART CATHETERIZATION WITH CORONARY ANGIOGRAM N/A 06/07/2013   Procedure: LEFT HEART CATHETERIZATION WITH CORONARY ANGIOGRAM;  Surgeon: [name], MD;  Location: [facility] CATH LAB;  Service: Cardiovascular;  Laterality: N/A;   LEFT HEART CATHETERIZATION WITH CORONARY ANGIOGRAM N/A 02/25/2014   Procedure: LEFT HEART CATHETERIZATION WITH CORONARY ANGIOGRAM;  Surgeon: Troy Sine, MD;  Location: [facility] C[facility] LAB;  Service: Cardiovascular;  Laterality: N/A;   LUMBAR FUSION  01/2007   DR. Patrice Paradise...3-LEVEL WITH FIXATION   OOPHORECTOMY     BSO? pt.unsure   PARATHYROIDECTOMY     SPINE SURGERY     THYROIDECTOMY     TOTAL ABDOMINAL HYSTERECTOMY       OB History     Gravida  3   Para  2   Term      Preterm      AB  1   Living  3      SAB  1   IAB      Ectopic      Multiple  1   Live Births              Family History  Problem Relation Age of Onset   Heart attack Mother 39       s/p D&C-CARDIAC ARREST 1966   Heart disease Mother    Heart attack Father 76       77 WITH MI   Heart disease Father    Diabetes Brother    Diabetes Maternal Aunt    Arthritis Maternal Aunt    Breast cancer Maternal Aunt        Post menopausal   Colon cancer Neg Hx     Social History   Tobacco Use   Smoking status: Former    Years: 30.00    Types: Cigarettes    Quit date: 05/31/1986    Years  since quitting: 34.4   Smokeless tobacco: Never  Vaping Use   Vaping Use: Never used  Substance Use Topics   Alcohol use: No   Drug use: No    Home Medications Prior to Admission medications   Medication Sig Start Date End Date Taking? Authorizing Provider  naproxen (NAPROSYN) 500 MG tablet Take 1 tablet (500 mg total) by mouth 2 (two) times daily. 10/24/20  Yes Rayna Sexton, PA-C  allopurinol (ZYLOPRIM) 300 MG tablet Take 1 tablet (300 mg total) by mouth as needed (for Gout). 11/15/19   Biagio Borg, MD  aspirin 81 MG EC tablet Take 81 mg by mouth daily.     [provider]  atenolol (TENORMIN) 25 MG tablet Take 1 tablet (25 mg total) by mouth daily. TAKE 1 TABLET TWICE DAILY 08/20/20   Biagio Borg, MD  atorvastatin (LIPITOR) 80 MG tablet Take 1 tablet (80 mg total) by mouth daily. 08/03/20 08/03/21  Richardson Dopp T, PA-C  azithromycin (ZITHROMAX) 250 MG tablet 2 tab by mouth day 1, then 1 per day 03/19/20   Biagio Borg, MD  celecoxib (CELEBREX) 200 MG capsule Take 1 capsule (200 mg total) by mouth 2 (two) times daily as needed for moderate pain. 09/19/19   Biagio Borg, MD  Cholecalciferol (THERA-D 2000) 50 MCG (2000 UT) TABS 1 tab by mouth once daily 03/23/20   Biagio Borg, MD  colchicine 0.6 MG tablet Take 1 tablet (0.6 mg total) by mouth daily as needed (gout pain). 12/23/19   Gregor Hams, MD  cyclobenzaprine (FLEXERIL) 5 MG tablet Take 1 tablet (5 mg total) by mouth 3 (three) times daily as needed for muscle spasms. 09/19/19   Biagio Borg, MD  docusate sodium (COLACE) 100 MG capsule Take 1 capsule (100 mg total) by mouth 2 (two) times daily as needed for mild constipation. 03/04/19   Joseph Pierini, MD  doxycycline (VIBRAMYCIN) 100 MG capsule Take 1 capsule (100 mg  total) by mouth 2 (two) times daily. 10/24/20  Yes Rayna Sexton, PA-C  ezetimibe (ZETIA) 10 MG tablet Take 1 tablet (10 mg total) by mouth daily. 09/13/19   Richardson Dopp T, PA-C  fexofenadine (ALLEGRA) 180 MG  tablet Take 1 tablet (180 mg total) by mouth as needed for allergies or rhinitis. 09/19/19   Biagio Borg, MD  gabapentin (NEURONTIN) 300 MG capsule Take 300 mg by mouth as needed (pain).    [provider]  hydrochlorothiazide (MICROZIDE) 12.5 MG capsule Take 1 capsule (12.5 mg total) by mouth daily. 10/25/17   Biagio Borg, MD  hydrocortisone 1 % lotion Apply 1 application topically 2 (two) times daily. Apply to affected area 03/04/19   Joseph Pierini, MD  indomethacin (INDOCIN) 50 MG capsule Take 1 capsule (50 mg total) by mouth 3 (three) times daily as needed (for gout attack). 11/15/19   Biagio Borg, MD  isosorbide mononitrate (IMDUR) 30 MG 24 hr tablet Take 0.5 tablets (15 mg total) by mouth daily. 07/17/19 10/15/19  Richardson Dopp T, PA-C  metFORMIN (GLUCOPHAGE-XR) 500 MG 24 hr tablet  07/28/19   [provider]  nitroGLYCERIN (NITROSTAT) 0.4 MG SL tablet Place 1 tablet (0.4 mg total) under the tongue every 5 (five) minutes as needed for chest pain. 12/31/18   Richardson Dopp T, PA-C  oxyCODONE (OXY IR/ROXICODONE) 5 MG immediate release tablet 4 (four) times daily as needed. 10/01/17   [provider]  oxyCODONE-acetaminophen (PERCOCET/ROXICET) 5-325 MG tablet Take 1 tablet by mouth every 8 (eight) hours as needed for severe pain. 10/24/20  Yes Rayna Sexton, PA-C  pantoprazole (PROTONIX) 40 MG tablet Take 40 mg by mouth as needed (heartburn acid reflux).     [provider]  pregabalin (LYRICA) 75 MG capsule Take 75 mg by mouth daily as needed (pain).  11/20/18   [provider]  SYNTHROID 150 MCG tablet TAKE 1 TABLET(150 MCG) BY MOUTH DAILY 08/04/20   Biagio Borg, MD  tiZANidine (ZANAFLEX) 2 MG tablet Take 1 tablet (2 mg total) by mouth every 6 (six) hours as needed for muscle spasms. 03/13/18   Biagio Borg, MD  traMADol (ULTRAM) 50 MG tablet Take 1 tablet (50 mg total) by mouth every 6 (six) hours as needed. 09/17/20   Biagio Borg, MD  triamcinolone  (NASACORT) 55 MCG/ACT AERO nasal inhaler Place 2 sprays into the nose as needed (congestion). 09/19/19   Biagio Borg, MD  vitamin B-12 (CYANOCOBALAMIN) 1000 MCG tablet Take 1 tablet (1,000 mcg total) by mouth daily. 03/23/20   Biagio Borg, MD    Allergies    Crestor [rosuvastatin calcium], Prilosec [omeprazole], Miconazole nitrate, Augmentin [amoxicillin-pot clavulanate], and Doxycycline  Review of Systems   Review of Systems  All other systems reviewed and are negative. Ten systems reviewed and are negative for acute change, except as noted in the HPI.   Physical Exam Updated Vital Signs BP 91/60 (BP Location: Right Arm)   Pulse 68   Temp 99.4 F (37.4 C) (Oral)   Resp 18   Ht _0  (1.676 m)   Wt 95.3 kg   SpO2 96%   BMI 33.89 kg/m   Physical Exam Vitals and nursing note reviewed.  Constitutional:      General: She is not in acute distress.    Appearance: Normal appearance. She is not ill-appearing, toxic-appearing or diaphoretic.  HENT:     Head: Normocephalic and atraumatic.     Right Ear: External  ear normal.     Left Ear: External ear normal.     Nose: Nose normal.     Mouth/Throat:     Mouth: Mucous membranes are moist.     Pharynx: Oropharynx is clear. No oropharyngeal exudate or posterior oropharyngeal erythema.  Eyes:     Extraocular Movements: Extraocular movements intact.  Cardiovascular:     Rate and Rhythm: Normal rate and regular rhythm.     Pulses: Normal pulses.     Heart sounds: Normal heart sounds. No murmur heard.   No friction rub. No gallop.  Pulmonary:     Effort: Pulmonary effort is normal. No respiratory distress.     Breath sounds: Normal breath sounds. No stridor. No wheezing, rhonchi or rales.  Abdominal:     General: Abdomen is flat.     Palpations: Abdomen is soft.     Tenderness: There is no abdominal tenderness.  Musculoskeletal:        General: Swelling and tenderness present. No signs of injury. Normal range of motion.      Cervical back: Normal range of motion and neck supple. No tenderness.     Comments: Moderate tenderness noted circumferentially along the left wrist with associated soft tissue swelling and erythema that appears to be focused around the radial aspect of the wrist.  Mild increased warmth in the region.  Unable to assess range of motion due to patient's pain.  2+ radial pulses.  Able to wiggle the fingers without difficulty.  Skin:    General: Skin is warm and dry.     Findings: Erythema present.  Neurological:     General: No focal deficit present.     Mental Status: She is alert and oriented to person, place, and time.  Psychiatric:        Mood and Affect: Mood normal.        Behavior: Behavior normal.   ED Results / Procedures / Treatments   Labs (all labs ordered are listed, but only abnormal results are displayed) Labs Reviewed  COMPREHENSIVE METABOLIC PANEL - Abnormal; Notable for the following components:      Result Value   Glucose, Bld 105 (*)    Total Protein 8.5 (*)    All other components within normal limits  CBC WITH DIFFERENTIAL/PLATELET - Abnormal; Notable for the following components:   RBC 5.16 (*)    HCT 46.8 (*)    Neutro Abs 8.0 (*)    Abs Immature Granulocytes 0.09 (*)    All other components within normal limits  SEDIMENTATION RATE - Abnormal; Notable for the following components:   Sed Rate 53 (*)    All other components within normal limits  LACTIC ACID, PLASMA   EKG None  Radiology DG Wrist Complete Left  Result Date: 10/24/2020 CLINICAL DATA:  Atraumatic left wrist pain EXAM: LEFT WRIST - COMPLETE 3+ VIEW COMPARISON:  None. FINDINGS: Four view radiograph left wrist demonstrates severe degenerative arthritis of the first carpometacarpal joint at the base of the thumb with radial subluxation of the a first metacarpal in relation to the trapezium. Remaining joint spaces are preserved. No acute fracture or dislocation there is mild soft tissue swelling noted  along the dorsum of the carpus. IMPRESSION: Advanced degenerative arthritis of the first carpometacarpal joint at the base of the thumb. Dorsal soft tissue swelling. Electronically Signed   By: Fidela Salisbury M.D.   On: 10/24/2020 19:42    Procedures Procedures   Medications Ordered in ED Medications  oxyCODONE-acetaminophen (  PERCOCET/ROXICET) 5-325 MG per tablet 1 tablet (1 tablet Oral Given 10/24/20 1923)  oxyCODONE-acetaminophen (PERCOCET/ROXICET) 5-325 MG per tablet 1 tablet (1 tablet Oral Given 10/24/20 2102)   ED Course  I have reviewed the triage vital signs and the nursing notes.  Pertinent labs & imaging results that were available during my care of the patient were reviewed by me and considered in my medical decision making (see chart for details).  Clinical Course as of 10/24/20 2223  Sat Oct 24, 2020  2123 History of gout, no fever, no white count. F/u with PCP on Monday. DC on doxy for possible cellulitis. Treat pain. [LJ]    Clinical Course User Index [LJ] Rayna Sexton, PA-C   MDM Rules/Calculators/A&P                          Pt is a 75 y.o. female who presents to the emergency department due to atraumatic left wrist pain and swelling.  Labs: CBC with RBCs of 5.16, hematocrit of 46.8, neutrophils of 8, absolute immature granulocytes of 0.09. CMP with a glucose of 105 and a total protein of 8.5. Lactic acid 1.3. ESR 53.  Imaging: X-ray of the left wrist shows advanced degenerative arthritis of the first carpometacarpal joint at the base of the thumb.  Dorsal soft tissue swelling.  I, Rayna Sexton, PA-C, personally reviewed and evaluated these images and lab results as part of my medical decision-making.  Patient has a history of gout in the feet but never in the wrist.  Lab work obtained which is generally reassuring.  CBC without leukocytosis.  No elevation in patient's lactic acid.  CMP with no electrolyte derangements.  Elevated ESR at 53.  Patient afebrile  and not tachycardic.  Feel that this is likely gout versus less likely septic joint.  Will discharge patient on a course of naproxen as well as Percocet.  We discussed safety regarding these medications.  Patient's kidney function is normal.  We will also discharge on a course of doxycycline for possible infection.  She was given a referral to orthopedics.  We discussed return precautions.    Feel that she is stable for discharge at this time and she is agreeable.  Her questions were answered and she was amicable at the time of discharge.  Note: Portions of this report may have been transcribed using voice recognition software. Every effort was made to ensure accuracy; however, inadvertent computerized transcription errors may be present.   Final Clinical Impression(s) / ED Diagnoses Final diagnoses:  Left wrist pain    Rx / DC Orders ED Discharge Orders          Ordered    oxyCODONE-acetaminophen (PERCOCET/ROXICET) 5-325 MG tablet  Every 8 hours PRN        10/24/20 2157    doxycycline (VIBRAMYCIN) 100 MG capsule  2 times daily        10/24/20 2157    naproxen (NAPROSYN) 500 MG tablet  2 times daily        10/24/20 2201             Rayna Sexton, PA-C 10/24/20 2210    Rayna Sexton, PA-C 10/24/20 2224    Luna Fuse, MD 11/04/20 [phone]

## 2020-10-24 NOTE — ED Triage Notes (Signed)
Per EMS- Patient c/o left hand and wrist pain and swelling that started around 1000 this AM. Patient denies any injury or fall.

## 2020-10-24 NOTE — Discharge Instructions (Addendum)
I believe your symptoms today are likely due to gout.  I am also concerned that there could possibly be a developing infection, though this appears less likely.  I have prescribed you a strong narcotic called Percocet. Please only take this as prescribed. This medication also has tylenol in it, so please be sure you are not taking more than 3000 mg of tylenol per day. Do not drive or operate heavy machinery after taking this medication. Do not mix it with alcohol.   I am prescribing you an anti-inflammatory medication called naproxen.  This is similar to Aleve.  You can take this up to 2 times a day for management of your pain.  Try to take it with a small amount of food to help prevent stomach irritation.  I have also prescribed you an antibiotic for a possible infection.  This is called doxycycline.  Please take this twice a day for the next 10 days.  Below is the contact information for Dr. Mardelle Matte.  He is a local orthopedic doctor.  Please give them a call next week if your symptoms have not began improving.  If your symptoms worsen, please come back to the emergency department immediately.

## 2020-10-26 ENCOUNTER — Other Ambulatory Visit: Payer: Self-pay

## 2020-10-26 DIAGNOSIS — M109 Gout, unspecified: Secondary | ICD-10-CM

## 2020-10-26 MED ORDER — ALLOPURINOL 300 MG PO TABS: 300.0000 mg | ORAL_TABLET | ORAL | 3 refills | Status: DC | PRN

## 2020-10-27 ENCOUNTER — Encounter (HOSPITAL_BASED_OUTPATIENT_CLINIC_OR_DEPARTMENT_OTHER): Payer: Self-pay | Admitting: *Deleted

## 2020-10-27 ENCOUNTER — Ambulatory Visit (INDEPENDENT_AMBULATORY_CARE_PROVIDER_SITE_OTHER): Payer: Medicare HMO | Admitting: Internal Medicine

## 2020-10-27 ENCOUNTER — Other Ambulatory Visit: Payer: Self-pay

## 2020-10-27 ENCOUNTER — Observation Stay (HOSPITAL_BASED_OUTPATIENT_CLINIC_OR_DEPARTMENT_OTHER)
Admission: EM | Admit: 2020-10-27 | Discharge: 2020-10-29 | Disposition: A | Discharge status: Home / Self Care | Payer: Medicare HMO | Attending: Family Medicine | Admitting: Family Medicine

## 2020-10-27 ENCOUNTER — Emergency Department (HOSPITAL_BASED_OUTPATIENT_CLINIC_OR_DEPARTMENT_OTHER): Payer: Medicare HMO

## 2020-10-27 ENCOUNTER — Encounter: Payer: Self-pay | Admitting: Internal Medicine

## 2020-10-27 VITALS — BP 118/70 | HR 51 | Temp 99.4°F | Ht 66.0 in | Wt 201.0 lb

## 2020-10-27 DIAGNOSIS — Z79899 Other long term (current) drug therapy: Secondary | ICD-10-CM | POA: Diagnosis not present

## 2020-10-27 DIAGNOSIS — M109 Gout, unspecified: Secondary | ICD-10-CM | POA: Diagnosis not present

## 2020-10-27 DIAGNOSIS — Z7982 Long term (current) use of aspirin: Secondary | ICD-10-CM | POA: Diagnosis not present

## 2020-10-27 DIAGNOSIS — E039 Hypothyroidism, unspecified: Secondary | ICD-10-CM | POA: Diagnosis not present

## 2020-10-27 DIAGNOSIS — L03114 Cellulitis of left upper limb: Secondary | ICD-10-CM | POA: Diagnosis not present

## 2020-10-27 DIAGNOSIS — Z87891 Personal history of nicotine dependence: Secondary | ICD-10-CM | POA: Diagnosis not present

## 2020-10-27 DIAGNOSIS — E119 Type 2 diabetes mellitus without complications: Secondary | ICD-10-CM | POA: Diagnosis not present

## 2020-10-27 DIAGNOSIS — Z20822 Contact with and (suspected) exposure to covid-19: Secondary | ICD-10-CM | POA: Diagnosis not present

## 2020-10-27 DIAGNOSIS — E1165 Type 2 diabetes mellitus with hyperglycemia: Secondary | ICD-10-CM

## 2020-10-27 DIAGNOSIS — Z7984 Long term (current) use of oral hypoglycemic drugs: Secondary | ICD-10-CM | POA: Insufficient documentation

## 2020-10-27 DIAGNOSIS — G8929 Other chronic pain: Secondary | ICD-10-CM

## 2020-10-27 DIAGNOSIS — I10 Essential (primary) hypertension: Secondary | ICD-10-CM | POA: Insufficient documentation

## 2020-10-27 DIAGNOSIS — E559 Vitamin D deficiency, unspecified: Secondary | ICD-10-CM

## 2020-10-27 DIAGNOSIS — E538 Deficiency of other specified B group vitamins: Secondary | ICD-10-CM

## 2020-10-27 DIAGNOSIS — M545 Low back pain, unspecified: Secondary | ICD-10-CM

## 2020-10-27 DIAGNOSIS — M1812 Unilateral primary osteoarthritis of first carpometacarpal joint, left hand: Secondary | ICD-10-CM | POA: Diagnosis not present

## 2020-10-27 LAB — BASIC METABOLIC PANEL
Anion gap: 10 (ref 5–15)
BUN: 7 mg/dL — ABNORMAL LOW (ref 8–23)
CO2: 28 mmol/L (ref 22–32)
Calcium: 9.4 mg/dL (ref 8.9–10.3)
Chloride: 98 mmol/L (ref 98–111)
Creatinine, Ser: 0.71 mg/dL (ref 0.44–1.00)
GFR, Estimated: >60 mL/min (ref >60)
Glucose, Bld: 87 mg/dL (ref 70–99)
Potassium: 4 mmol/L (ref 3.5–5.1)
Sodium: 136 mmol/L (ref 135–145)

## 2020-10-27 LAB — CBC WITH DIFFERENTIAL/PLATELET
Abs Immature Granulocytes: 0.08 10*3/uL — ABNORMAL HIGH (ref 0.00–0.07)
Basophils Absolute: 0 10*3/uL (ref 0.0–0.1)
Basophils Relative: 0 %
Eosinophils Absolute: 0 10*3/uL (ref 0.0–0.5)
Eosinophils Relative: 0 %
HCT: 42.3 % (ref 36.0–46.0)
Hemoglobin: 13.5 g/dL (ref 12.0–15.0)
Immature Granulocytes: 1 %
Lymphocytes Relative: 17 %
Lymphs Abs: 1.7 10*3/uL (ref 0.7–4.0)
MCH: 27.9 pg (ref 26.0–34.0)
MCHC: 31.9 g/dL (ref 30.0–36.0)
MCV: 87.4 fL (ref 80.0–100.0)
Monocytes Absolute: 0.8 10*3/uL (ref 0.1–1.0)
Monocytes Relative: 7 %
Neutro Abs: 7.6 10*3/uL (ref 1.7–7.7)
Neutrophils Relative %: 75 %
Platelets: 310 10*3/uL (ref 150–400)
RBC: 4.84 MIL/uL (ref 3.87–5.11)
RDW: 14 % (ref 11.5–15.5)
WBC: 10.2 10*3/uL (ref 4.0–10.5)
nRBC: 0 % (ref 0.0–0.2)

## 2020-10-27 LAB — LACTIC ACID, PLASMA: Lactic Acid, Venous: 1.1 mmol/L (ref 0.5–1.9)

## 2020-10-27 MED ORDER — HYDROMORPHONE HCL 1 MG/ML IJ SOLN
0.5000 mg | Freq: Once | INTRAMUSCULAR | Status: AC
Start: 2020-10-27 — End: 2020-10-27
  Administered 2020-10-27: 0.5 mg via INTRAVENOUS
  Filled 2020-10-27: qty 1

## 2020-10-27 MED ORDER — CLINDAMYCIN PHOSPHATE 600 MG/50ML IV SOLN
600.0000 mg | Freq: Once | INTRAVENOUS | Status: AC
  Administered 2020-10-27: 600 mg via INTRAVENOUS
  Filled 2020-10-27: qty 50

## 2020-10-27 NOTE — Progress Notes (Signed)
Chief Complaint: follow up left wrist and hand       HPI:  [name] is a 75 y.o. female here after a injection to left deltoid done late last wk, then onset pain and sweling to distal LUE about the post left wrist area starting last Friday, for which she was seen in the ED oct 8, dx with cellulitis and tx with pain medication and doxy course.  For some reason she was confused about the doxycycline and did not take.  Now has 1-2 days rapidely worsening severe pain, red, swelling of primarily the post left wrist and hand without overt absecess or drainage it seems,  Denies fever, chills but just feels tired.  Pt denies chest pain, increased sob or doe, wheezing, orthopnea, PND, increased LE swelling, palpitations, dizziness or syncope.   Pt denies polydipsia, polyuria,        Wt Readings from Last 3 Encounters:  10/28/20 201 lb 4.5 oz (91.3 kg)  10/27/20 201 lb (91.2 kg)  10/24/20 210 lb (95.3 kg)   BP Readings from Last 3 Encounters:  10/30/20 126/77  10/29/20 95/62  10/27/20 118/70         Past Medical History:  Diagnosis Date   AAA (abdominal aortic aneurysm)    a. s/p stent graft repair 01/2016.   Acute ischemic colitis ([place]) 07/29/2010   Diagnosed June, 2012 characterized by acute lower GI bleeding, spontaneously resolved.    Acute respiratory infection 06/09/2014   Acute sinus infection 05/14/2015   Allergic rhinitis, cause unspecified    Angioedema of lips 06/03/2014   Anxiety state, unspecified    Arthritis of left hip 04/16/2015   Bilateral hearing loss 07/19/2017   Chronic LBP    Coronary artery disease    a. inferior MI 1998 s/p PCI of RCA. b. stenting of Cx 04/2010. c. DES to prox LAD 05/2013. d. DES x 3 in 10/2015. // Myoview 07/2018:  EF 59, normal perfusion, low risk    Coronary artery disease involving native heart without angina pectoris 07/31/2006   Qualifier: Diagnosis of  By: [name] MD, [name]  ANGIOGRAPHIC DATA:  1. Ventriculography done in the RAO projection  reveals a small wall  motion abnormality in the mid inferior wall. Ejection fraction  would be estimated at around 50%.  2. The right coronary artery has some progressive disease of about 60-  80% in the proximal mid segment. The distal vessel appears to be  50-70% narrowing althou   Degeneration of lumbar or lumbosacral intervertebral disc    Depressive disorder, not elsewhere classified    Diabetes ([place]) 11/03/2006   Qualifier: Diagnosis of  By: [name] MD, [name]    Diabetes mellitus    TYPE II   Frequent urination 05/08/2014   Gout 08/22/2013   History of endovascular stent graft for abdominal aortic aneurysm (AAA) 01/22/2016   Hyperlipidemia    Hypertension    Hypothyroidism    Iron deficiency anemia 06/13/2017   Lower GI bleed 06/2010   Diverticular bleed   Lumbar radiculopathy 05/15/2015   MENOPAUSAL DISORDER 10/29/2007   Qualifier: Diagnosis of  By: [name] MD, [name]    Myalgia 09/25/2013   Noncompliance with medications 02/26/2014   Obesity, unspecified    OTITIS MEDIA, SEROUS, CHRONIC 04/23/2007   Qualifier: Diagnosis of  By: [name] MD, [name]    Thoracic disc disease 11/19/2018   URTICARIA 09/23/2009   Qualifier: Diagnosis of  By: [name] MD, Mateo Flow  A    Venous insufficiency 07/19/2017   Past Surgical History:  Procedure Laterality Date   CARDIAC CATHETERIZATION     PCI OF BOTH THE CIRCUMFLEX AND LEFT ANTERIOR DESCENDING ARTERY   CARDIAC CATHETERIZATION N/A 10/29/2015   Procedure: Coronary Stent Intervention;  Surgeon: Nelva Bush, MD;  Location: [place] [facility];  Service: Cardiovascular;  Laterality: N/A;   CARDIAC CATHETERIZATION N/A 10/29/2015   Procedure: Coronary/Graft Angiography;  Surgeon: Nelva Bush, MD;  Location: [place] [facility];  Service: Cardiovascular;  Laterality: N/A;   CARDIAC CATHETERIZATION N/A 10/29/2015   Procedure: Intravascular Pressure Wire/FFR Study;  Surgeon: Nelva Bush, MD;  Location: [place] [facility];  Service: Cardiovascular;   Laterality: N/A;   CESAREAN SECTION     ENDOVASCULAR STENT INSERTION N/A 01/22/2016   Procedure: ABDOMINAL AORTIC ENDOVASCULAR STENT GRAFT INSERTION;  Surgeon: Rosetta Posner, MD;  Location: [facility];  Service: Vascular;  Laterality: N/A;   HEART STENT  04-2010  and  Jun 07, 2013   X 3   LEFT HEART CATH AND CORONARY ANGIOGRAPHY N/A 04/03/2019   Procedure: LEFT HEART CATH AND CORONARY ANGIOGRAPHY;  Surgeon: Nelva Bush, MD;  Location: [place] [facility];  Service: Cardiovascular;  Laterality: N/A;   LEFT HEART CATHETERIZATION WITH CORONARY ANGIOGRAM N/A 06/07/2013   Procedure: LEFT HEART CATHETERIZATION WITH CORONARY ANGIOGRAM;  Surgeon: [name], MD;  Location[facility];  Service: Cardiovascular;  Laterality: N/A;   LEFT HEART CATHETERIZATION WITH CORONARY ANGIOGRAM N/A 02/25/2014   Procedure: LEFT HEART CATHETERIZATION WITH CORONARY ANGIOGRAM;  Surgeon: Troy Sine, MD;  Location: [facility] C[facility] LAB;  Service: Cardiovascular;  Laterality: N/A;   LUMBAR FUSION  01/2007   DR. Patrice Paradise...3-LEVEL WITH FIXATION   OOPHORECTOMY     BSO? pt.unsure   PARATHYROIDECTOMY     SPINE SURGERY     THYROIDECTOMY     TOTAL ABDOMINAL HYSTERECTOMY      reports that she quit smoking about 34 years ago. Her smoking use included cigarettes. She has never used smokeless tobacco. She reports that she does not drink alcohol and does not use drugs. family history includes Arthritis in her maternal aunt; Breast cancer in her maternal aunt; Diabetes in her brother and maternal aunt; Heart attack (age of onset: 110) in her mother; Heart attack (age of onset: 67) in her father; Heart disease in her father and mother. Allergies  Allergen Reactions   Crestor [Rosuvastatin Calcium] Other (See Comments)    Feeling poor   Prilosec [Omeprazole] Other (See Comments)    Chest pain   Miconazole Nitrate Hives    REACTION: hives   Augmentin [Amoxicillin-Pot Clavulanate] Hives, Itching and Rash    Has patient had a PCN  reaction causing immediate rash, facial/tongue/throat swelling, SOB or lightheadedness with hypotension:unsure Has patient had a PCN reaction causing severe rash involving mucus membranes or skin necrosis:unsure Has patient had a PCN reaction that required hospitalization:No Has patient had a PCN reaction occurring within the last 10 years:NO If all of the above answers are "NO", then may proceed with Cephalosporin use. Has patient had a PCN reaction causing immediate rash, facial/tongue/throat swelling, SOB or lightheadedness with hypotension:unsure Has patient had a PCN reaction causing severe rash involving mucus membranes or skin necrosis:unsure Has patient had a PCN reaction that required hospitalization:No Has patient had a PCN reaction occurring within the last 10 years:NO If all of the above answers are "NO", then may proceed with Cephalosporin use.    Doxycycline Other (See  Comments)    REACTION: gi upset   Current Outpatient Medications on File Prior to Visit  Medication Sig Dispense Refill   allopurinol (ZYLOPRIM) 300 MG tablet Take 1 tablet (300 mg total) by mouth as needed (for Gout). 90 tablet 3   aspirin 81 MG EC tablet Take 81 mg by mouth daily.      atenolol (TENORMIN) 25 MG tablet Take 1 tablet (25 mg total) by mouth daily. TAKE 1 TABLET TWICE DAILY (Patient taking differently: Take 25 mg by mouth daily.) 180 tablet 0   celecoxib (CELEBREX) 200 MG capsule Take 1 capsule (200 mg total) by mouth 2 (two) times daily as needed for moderate pain. 180 capsule 1   Cholecalciferol (THERA-D 2000) 50 MCG (2000 UT) TABS 1 tab by mouth once daily (Patient taking differently: Take 1 tablet by mouth daily at 12 noon.) 90 tablet 99   docusate sodium (COLACE) 100 MG capsule Take 1 capsule (100 mg total) by mouth 2 (two) times daily as needed for mild constipation. (Patient not taking: No sig reported) 30 capsule 1   ezetimibe (ZETIA) 10 MG tablet Take 1 tablet (10 mg total) by mouth daily.  (Patient not taking: No sig reported) 90 tablet 1   nitroGLYCERIN (NITROSTAT) 0.4 MG SL tablet Place 1 tablet (0.4 mg total) under the tongue every 5 (five) minutes as needed for chest pain. 25 tablet 3   pantoprazole (PROTONIX) 40 MG tablet Take 40 mg by mouth daily as needed (heartburn acid reflux).     pregabalin (LYRICA) 75 MG capsule Take 75 mg by mouth daily as needed (pain).      SYNTHROID 150 MCG tablet TAKE 1 TABLET(150 MCG) BY MOUTH DAILY (Patient taking differently: Take 150 mcg by mouth daily.) 90 tablet 0   tiZANidine (ZANAFLEX) 2 MG tablet Take 1 tablet (2 mg total) by mouth every 6 (six) hours as needed for muscle spasms. 40 tablet 1   triamcinolone (NASACORT) 55 MCG/ACT AERO nasal inhaler Place 2 sprays into the nose as needed (congestion). 1 each 12   isosorbide mononitrate (IMDUR) 30 MG 24 hr tablet Take 0.5 tablets (15 mg total) by mouth daily. 45 tablet 3   No current facility-administered medications on file prior to visit.        ROS:  All others reviewed and negative.  Objective        PE:  BP 118/70 (BP Location: Right Arm, Patient Position: Sitting, Cuff Size: Large)   Pulse (!) 51   Temp 99.4 F (37.4 C) (Oral)   Ht 5\' 6"  (1.676 m)   Wt 201 lb (91.2 kg)   SpO2 96%   BMI 32.44 kg/m                 Constitutional: Pt appears in NAD               HENT: Head: NCAT.                Right Ear: External ear normal.                 Left Ear: External ear normal.                Eyes: . Pupils are equal, round, and reactive to light. Conjunctivae and EOM are normal               Nose: without d/c or deformity  Neck: Neck supple. Gross normal ROM               Cardiovascular: Normal rate and regular rhythm.                 Pulmonary/Chest: Effort normal and breath sounds without rales or wheezing.                Abd:  Soft, NT, ND, + BS, no organomegaly               Neurological: Pt is alert. At baseline orientation, motor grossly intact                Skin: LE edema - none, left wrist and hand with 2-3+ red, tender, swelling o/w neurovasc intact, no fluctuance or abscess               Psychiatric: Pt behavior is normal without agitation   Micro: none  Cardiac tracings I have personally interpreted today:  none  Pertinent Radiological findings (summarize): none   Lab Results  Component Value Date   WBC 8.0 10/29/2020   HGB 13.3 10/29/2020   HCT 42.6 10/29/2020   PLT 324 10/29/2020   GLUCOSE 125 (H) 10/29/2020   CHOL 169 07/30/2020   TRIG 122 07/30/2020   HDL 36 (L) 07/30/2020   LDLDIRECT 131.0 03/27/2015   LDLCALC 111 (H) 07/30/2020   ALT 17 10/29/2020   AST 26 10/29/2020   NA 136 10/29/2020   K 3.7 10/29/2020   CL 98 10/29/2020   CREATININE 0.99 10/29/2020   BUN 9 10/29/2020   CO2 27 10/29/2020   TSH 2.32 03/19/2020   INR 1.08 01/22/2016   HGBA1C 6.1 (H) 10/28/2020   MICROALBUR 0.8 03/19/2020   Assessment/Plan:  [name] is a 75 y.o. Black or African American [2] female with  has a past medical history of AAA (abdominal aortic aneurysm), Acute ischemic colitis ([place]) (07/29/2010), Acute respiratory infection (06/09/2014), Acute sinus infection (05/14/2015), Allergic rhinitis, cause unspecified, Angioedema of lips (06/03/2014), Anxiety state, unspecified, Arthritis of left hip (04/16/2015), Bilateral hearing loss (07/19/2017), Chronic LBP, Coronary artery disease, Coronary artery disease involving native heart without angina pectoris (07/31/2006), Degeneration of lumbar or lumbosacral intervertebral disc, Depressive disorder, not elsewhere classified, Diabetes ([place]) (11/03/2006), Diabetes mellitus, Frequent urination (05/08/2014), Gout (08/22/2013), History of endovascular stent graft for abdominal aortic aneurysm (AAA) (01/22/2016), Hyperlipidemia, Hypertension, Hypothyroidism, Iron deficiency anemia (06/13/2017), Lower GI bleed (06/2010), Lumbar radiculopathy (05/15/2015), MENOPAUSAL DISORDER (10/29/2007), Myalgia (09/25/2013),  Noncompliance with medications (02/26/2014), Obesity, unspecified, OTITIS MEDIA, SEROUS, CHRONIC (04/23/2007), Thoracic disc disease (11/19/2018), URTICARIA (09/23/2009), and Venous insufficiency (07/19/2017).  Cellulitis of left hand Post left hand and wrist, severe red and swelling without overt fluctuance or abscess, no evidence of red streaks or sirs/sepsis, and o/w neurovasc intact. Pt confused and did not take doxycycline.    Due to the severity now however, pt is likely a candidate for IV antbx - pt asked to present to ED for further evaluation and tx.    Diabetes ([place]) Lab Results  Component Value Date   HGBA1C 6.1 (H) 10/28/2020   Stable, pt to continue current medical treatment  - diet   Essential hypertension BP Readings from Last 3 Encounters:  10/30/20 126/77  10/29/20 95/62  10/27/20 118/70   Stable, pt to continue medical treatment tenormin  Followup: Return if symptoms worsen or fail to improve.  [name], MD 10/30/2020 9:39 PM [place]  Internal Medicine

## 2020-10-27 NOTE — Patient Instructions (Signed)
Please go now to [facility], as you likely will need to in the hospital for the IV antibx  Please continue all other medications as before, and refills have been done if requested.  Please have the pharmacy call with any other refills you may need.  Please keep your appointments with your specialists as you may have planned

## 2020-10-27 NOTE — ED Provider Notes (Signed)
[facility] EMERGENCY DEPT Provider Note   CSN: [id number] Arrival date & time: 10/27/20  1628     History Chief Complaint  Patient presents with   Hand Swelling    [name] is a 75 y.o. female.  HPI Patient sent in for treatment of a left hand cellulitis.  Has had symptoms for a few days now.  Was seen in the ER 3 days ago and diagnosed with either gout versus cellulitis.  Has been prescribed doxycycline.  Patient states that it did not work, however she did not get the prescription filled.  Saw her PCP today.  Has had worsening redness and swelling since.  Increasing pain with movement.  States now it is more up in the hand and less down in the wrist.  Feels weak all over but does not have a fever.  Sent in for likely IV antibiotics.  No history of gout.  Of note patient also did have a COVID booster on the third on left upper arm. Past Medical History:  Diagnosis Date   AAA (abdominal aortic aneurysm)    a. s/p stent graft repair 01/2016.   Acute ischemic colitis ([place]) 07/29/2010   Diagnosed June, 2012 characterized by acute lower GI bleeding, spontaneously resolved.    Acute respiratory infection 06/09/2014   Acute sinus infection 05/14/2015   Allergic rhinitis, cause unspecified    Angioedema of lips 06/03/2014   Anxiety state, unspecified    Arthritis of left hip 04/16/2015   Bilateral hearing loss 07/19/2017   Chronic LBP    Coronary artery disease    a. inferior MI 1998 s/p PCI of RCA. b. stenting of Cx 04/2010. c. DES to prox LAD 05/2013. d. DES x 3 in 10/2015. // Myoview 07/2018:  EF 59, normal perfusion, low risk    Coronary artery disease involving native heart without angina pectoris 07/31/2006   Qualifier: Diagnosis of  By: [name] MD, [name]  ANGIOGRAPHIC DATA:  1. Ventriculography done in the RAO projection reveals a small wall  motion abnormality in the mid inferior wall. Ejection fraction  would be estimated at around 50%.  2. The right coronary artery  has some progressive disease of about 60-  80% in the proximal mid segment. The distal vessel appears to be  50-70% narrowing althou   Degeneration of lumbar or lumbosacral intervertebral disc    Depressive disorder, not elsewhere classified    Diabetes ([place]) 11/03/2006   Qualifier: Diagnosis of  By: [name] MD, [name]    Diabetes mellitus    TYPE II   Frequent urination 05/08/2014   Gout 08/22/2013   History of endovascular stent graft for abdominal aortic aneurysm (AAA) 01/22/2016   Hyperlipidemia    Hypertension    Hypothyroidism    Iron deficiency anemia 06/13/2017   Lower GI bleed 06/2010   Diverticular bleed   Lumbar radiculopathy 05/15/2015   MENOPAUSAL DISORDER 10/29/2007   Qualifier: Diagnosis of  By: [name] MD, [name]    Myalgia 09/25/2013   Noncompliance with medications 02/26/2014   Obesity, unspecified    OTITIS MEDIA, SEROUS, CHRONIC 04/23/2007   Qualifier: Diagnosis of  By: [name] MD, [name]    Thoracic disc disease 11/19/2018   URTICARIA 09/23/2009   Qualifier: Diagnosis of  By: [name] MD, Valerie A    Venous insufficiency 07/19/2017    Patient Active Problem List   Diagnosis Date Noted   Cellulitis of left hand 10/27/2020   Lymphadenopathy of head and neck 09/17/2020  Primary osteoarthritis of both knees 07/06/2020   Right ear pain 12/28/2019   Generalized weakness 11/16/2019   Cough 10/26/2019   Arthritis of left knee 09/19/2019   Unstable angina (HCC) 04/03/2019   Rash 11/22/2018   Thoracic disc disease 11/19/2018   Lumbar spinal stenosis 11/19/2018   Right hand paresthesia 09/15/2018   Right leg swelling 01/16/2018   Edema 10/25/2017   Venous insufficiency 07/19/2017   B12 deficiency 07/19/2017   Bilateral hearing loss 07/19/2017   Iron deficiency anemia 06/13/2017   Coronary artery disease    Bilateral swelling of feet 05/29/2017   Pharyngitis 01/11/2017   Vaginal yeast infection 07/05/2016   Lower abdominal pain 07/05/2016   Chronic low back pain  06/28/2016   Insomnia 06/28/2016   History of endovascular stent graft for abdominal aortic aneurysm (AAA) 01/22/2016   Lumbar radiculopathy 05/15/2015   Acute sinus infection 05/14/2015   Arthritis of left hip 04/16/2015   Acute gout 01/16/2015   Chronic pain of both knees 07/15/2014   Acute respiratory infection 06/09/2014   Angioedema of lips 06/03/2014   Frequent urination 05/08/2014   Acute epiglottitis without obstruction 05/05/2014   Angioedema    Neck swelling    Abdominal tenderness 03/26/2014   Noncompliance with medications 02/26/2014   AAA (abdominal aortic aneurysm) ([place])    Chest pain 02/24/2014   Jaw pain 09/25/2013   Myalgia 09/25/2013   Gout 08/22/2013   Abdominal pain, unspecified site 08/13/2013   Musculoskeletal pain 08/04/2013   Aftercare following surgery of the circulatory system, NEC 06/25/2013   Peripheral vascular disease, unspecified ([place]) 06/19/2012   Dry cough 03/05/2012   Dysuria 04/21/2011   Low back pain 10/04/2010   Acute ischemic colitis ([place]) 07/29/2010   Lower GI bleed 07/23/2010   Liver lesion 07/23/2010   Encounter for well adult exam with abnormal findings 07/22/2010   URTICARIA 09/23/2009   TOBACCO ABUSE 11/18/2008   Obesity 11/17/2008   MENOPAUSAL DISORDER 10/29/2007   Anxiety state 06/22/2007   DEPRESSION 06/22/2007   Right otitis media 04/23/2007   Constipation 02/07/2007   [place] DISEASE, LUMBOSACRAL SPINE 02/07/2007   Diabetes ([place]) 11/03/2006   Allergic rhinitis 11/03/2006   Hypothyroidism 07/31/2006   Hyperlipidemia 07/31/2006   Essential hypertension 07/31/2006    Past Surgical History:  Procedure Laterality Date   CARDIAC CATHETERIZATION     PCI OF BOTH THE CIRCUMFLEX AND LEFT ANTERIOR DESCENDING ARTERY   CARDIAC CATHETERIZATION N/A 10/29/2015   Procedure: Coronary Stent Intervention;  Surgeon: Nelva Bush, MD;  Location: [facility];  Service: Cardiovascular;  Laterality: N/A;   CARDIAC CATHETERIZATION  N/A 10/29/2015   Procedure: Coronary/Graft Angiography;  Surgeon: Nelva Bush, MD;  Location: [facility];  Service: Cardiovascular;  Laterality: N/A;   CARDIAC CATHETERIZATION N/A 10/29/2015   Procedure: Intravascular Pressure Wire/FFR Study;  Surgeon: Nelva Bush, MD;  Location: [facility];  Service: Cardiovascular;  Laterality: N/A;   CESAREAN SECTION     ENDOVASCULAR STENT INSERTION N/A 01/22/2016   Procedure: ABDOMINAL AORTIC ENDOVASCULAR STENT GRAFT INSERTION;  Surgeon: Rosetta Posner, MD;  Location: [facility];  Service: Vascular;  Laterality: N/A;   HEART STENT  04-2010  and  Jun 07, 2013   X 3   LEFT HEART CATH AND CORONARY ANGIOGRAPHY N/A 04/03/2019   Procedure: LEFT HEART CATH AND CORONARY ANGIOGRAPHY;  Surgeon: Nelva Bush, MD;  Location: [facility];  Service: Cardiovascular;  Laterality: N/A;   LEFT HEART CATHETERIZATION WITH CORONARY ANGIOGRAM N/A  06/07/2013   Procedure: LEFT HEART CATHETERIZATION WITH CORONARY ANGIOGRAM;  Surgeon: [name], MD;  Location: [facility] CATH LAB;  Service: Cardiovascular;  Laterality: N/A;   LEFT HEART CATHETERIZATION WITH CORONARY ANGIOGRAM N/A 02/25/2014   Procedure: LEFT HEART CATHETERIZATION WITH CORONARY ANGIOGRAM;  Surgeon: Troy Sine, MD;  Location: [facility] CATH LAB;  Service: Cardiovascular;  Laterality: N/A;   LUMBAR FUSION  01/2007   DR. Patrice Paradise...3-LEVEL WITH FIXATION   OOPHORECTOMY     BSO? pt.unsure   PARATHYROIDECTOMY     SPINE SURGERY     THYROIDECTOMY     TOTAL ABDOMINAL HYSTERECTOMY       OB History     Gravida  3   Para  2   Term      Preterm      AB  1   Living  3      SAB  1   IAB      Ectopic      Multiple  1   Live Births              Family History  Problem Relation Age of Onset   Heart attack Mother 30       s/p D&C-CARDIAC ARREST 1966   Heart disease Mother    Heart attack Father 46       10 WITH MI   Heart disease Father    Diabetes Brother    Diabetes  Maternal Aunt    Arthritis Maternal Aunt    Breast cancer Maternal Aunt        Post menopausal   Colon cancer Neg Hx     Social History   Tobacco Use   Smoking status: Former    Years: 30.00    Types: Cigarettes    Quit date: 05/31/1986    Years since quitting: 34.4   Smokeless tobacco: Never  Vaping Use   Vaping Use: Never used  Substance Use Topics   Alcohol use: No   Drug use: No    Home Medications Prior to Admission medications   Medication Sig Start Date End Date Taking? Authorizing Provider  aspirin 81 MG EC tablet Take 81 mg by mouth daily.    Yes [provider]  atenolol (TENORMIN) 25 MG tablet Take 1 tablet (25 mg total) by mouth daily. TAKE 1 TABLET TWICE DAILY 08/20/20  Yes Biagio Borg, MD  atorvastatin (LIPITOR) 80 MG tablet Take 1 tablet (80 mg total) by mouth daily. 08/03/20 08/03/21 Yes Weaver, Scott T, PA-C  celecoxib (CELEBREX) 200 MG capsule Take 1 capsule (200 mg total) by mouth 2 (two) times daily as needed for moderate pain. 09/19/19  Yes Biagio Borg, MD  cyclobenzaprine (FLEXERIL) 5 MG tablet Take 1 tablet (5 mg total) by mouth 3 (three) times daily as needed for muscle spasms. 09/19/19  Yes Biagio Borg, MD  oxyCODONE (OXY IR/ROXICODONE) 5 MG immediate release tablet 4 (four) times daily as needed. 10/01/17  Yes [provider]  oxyCODONE-acetaminophen (PERCOCET/ROXICET) 5-325 MG tablet Take 1 tablet by mouth every 8 (eight) hours as needed for severe pain. 10/24/20  Yes Rayna Sexton, PA-C  allopurinol (ZYLOPRIM) 300 MG tablet Take 1 tablet (300 mg total) by mouth as needed (for Gout). 10/26/20   Biagio Borg, MD  Cholecalciferol (THERA-D 2000) 50 MCG (2000 UT) TABS 1 tab by mouth once daily 03/23/20   Biagio Borg, MD  colchicine 0.6 MG tablet Take 1 tablet (0.6 mg total) by mouth  daily as needed (gout pain). 12/23/19   Gregor Hams, MD  docusate sodium (COLACE) 100 MG capsule Take 1 capsule (100 mg total) by mouth 2 (two) times daily as  needed for mild constipation. 03/04/19   Joseph Pierini, MD  doxycycline (VIBRAMYCIN) 100 MG capsule Take 1 capsule (100 mg total) by mouth 2 (two) times daily. Patient not taking: No sig reported 10/24/20   Rayna Sexton, PA-C  ezetimibe (ZETIA) 10 MG tablet Take 1 tablet (10 mg total) by mouth daily. 09/13/19   Richardson Dopp T, PA-C  fexofenadine (ALLEGRA) 180 MG tablet Take 1 tablet (180 mg total) by mouth as needed for allergies or rhinitis. 09/19/19   Biagio Borg, MD  gabapentin (NEURONTIN) 300 MG capsule Take 300 mg by mouth as needed (pain).    [provider]  hydrochlorothiazide (MICROZIDE) 12.5 MG capsule Take 1 capsule (12.5 mg total) by mouth daily. 10/25/17   Biagio Borg, MD  hydrocortisone 1 % lotion Apply 1 application topically 2 (two) times daily. Apply to affected area 03/04/19   Joseph Pierini, MD  indomethacin (INDOCIN) 50 MG capsule Take 1 capsule (50 mg total) by mouth 3 (three) times daily as needed (for gout attack). 11/15/19   Biagio Borg, MD  isosorbide mononitrate (IMDUR) 30 MG 24 hr tablet Take 0.5 tablets (15 mg total) by mouth daily. 07/17/19 10/15/19  Richardson Dopp T, PA-C  metFORMIN (GLUCOPHAGE-XR) 500 MG 24 hr tablet  07/28/19   [provider]  naproxen (NAPROSYN) 500 MG tablet Take 1 tablet (500 mg total) by mouth 2 (two) times daily. 10/24/20   Rayna Sexton, PA-C  nitroGLYCERIN (NITROSTAT) 0.4 MG SL tablet Place 1 tablet (0.4 mg total) under the tongue every 5 (five) minutes as needed for chest pain. 12/31/18   Richardson Dopp T, PA-C  pantoprazole (PROTONIX) 40 MG tablet Take 40 mg by mouth as needed (heartburn acid reflux).     [provider]  pregabalin (LYRICA) 75 MG capsule Take 75 mg by mouth daily as needed (pain).  11/20/18   [provider]  SYNTHROID 150 MCG tablet TAKE 1 TABLET(150 MCG) BY MOUTH DAILY 08/04/20   Biagio Borg, MD  tiZANidine (ZANAFLEX) 2 MG tablet Take 1 tablet (2 mg total) by mouth every 6 (six)  hours as needed for muscle spasms. 03/13/18   Biagio Borg, MD  traMADol (ULTRAM) 50 MG tablet Take 1 tablet (50 mg total) by mouth every 6 (six) hours as needed. 09/17/20   Biagio Borg, MD  triamcinolone (NASACORT) 55 MCG/ACT AERO nasal inhaler Place 2 sprays into the nose as needed (congestion). 09/19/19   Biagio Borg, MD  vitamin B-12 (CYANOCOBALAMIN) 1000 MCG tablet Take 1 tablet (1,000 mcg total) by mouth daily. 03/23/20   Biagio Borg, MD    Allergies    Crestor [rosuvastatin calcium], Prilosec [omeprazole], Miconazole nitrate, Augmentin [amoxicillin-pot clavulanate], and Doxycycline  Review of Systems   Review of Systems  Constitutional:  Positive for fatigue. Negative for appetite change and fever.  HENT:  Negative for congestion.   Respiratory:  Negative for shortness of breath.   Cardiovascular:  Negative for leg swelling.  Gastrointestinal:  Negative for abdominal distention.  Genitourinary:  Negative for flank pain.  Musculoskeletal:  Negative for back pain.       Left hand swelling.  Skin:  Positive for color change. Negative for wound.  Neurological:  Negative for weakness.  Psychiatric/Behavioral:  Negative for confusion.  Physical Exam Updated Vital Signs BP 131/75   Pulse 78   Temp 99 F (37.2 C)   Resp 18   Ht 5\' 6"  (1.676 m)   Wt 91.2 kg   SpO2 100%   BMI 32.44 kg/m   Physical Exam Vitals and nursing note reviewed.  HENT:     Head: Atraumatic.  Eyes:     Pupils: Pupils are equal, round, and reactive to light.  Cardiovascular:     Rate and Rhythm: Regular rhythm.  Pulmonary:     Breath sounds: No wheezing.  Abdominal:     Tenderness: There is no abdominal tenderness.  Musculoskeletal:        General: Tenderness present.     Cervical back: Neck supple.     Comments: Tama Gander swelling of dorsum of left hand.  Tenderness over second MCP joint.  Better movement at wrist then previous.  No fluctuance.  Pulse intact.  Good passive movement of  fingers.  Skin:    General: Skin is warm.  Neurological:     Mental Status: She is alert.       ED Results / Procedures / Treatments   Labs (all labs ordered are listed, but only abnormal results are displayed) Labs Reviewed  BASIC METABOLIC PANEL - Abnormal; Notable for the following components:      Result Value   BUN 7 (*)    All other components within normal limits  CBC WITH DIFFERENTIAL/PLATELET - Abnormal; Notable for the following components:   Abs Immature Granulocytes 0.08 (*)    All other components within normal limits  LACTIC ACID, PLASMA  LACTIC ACID, PLASMA    EKG None  Radiology DG Hand Complete Left  Result Date: 10/27/2020 CLINICAL DATA:  Infection. EXAM: LEFT HAND - COMPLETE 3+ VIEW COMPARISON:  X-ray left wrist 10/24/2020. FINDINGS: Severe degenerative changes of the first carpometacarpal joint. At least moderate degenerative changes of the proximal interphalangeal joints. At least moderate degenerative changes of the distal interphalangeal joints. No cortical erosion or destruction. There is no evidence of fracture or dislocation. There is no other focal bone abnormality. Subcutaneus soft tissue edema. No retained radiopaque foreign body. IMPRESSION: 1. No acute displaced fracture or dislocation. 2. No radiographic findings suggest osteomyelitis. Electronically Signed   By: Iven Finn M.D.   On: 10/27/2020 20:17    Procedures Procedures   Medications Ordered in ED Medications  HYDROmorphone (DILAUDID) injection 0.5 mg (0.5 mg Intravenous Given 10/27/20 2029)  clindamycin (CLEOCIN) IVPB 600 mg (600 mg Intravenous New Bag/Given 10/27/20 2029)    ED Course  I have reviewed the triage vital signs and the nursing notes.  Pertinent labs & imaging results that were available during my care of the patient were reviewed by me and considered in my medical decision making (see chart for details).    MDM Rules/Calculators/A&P                           Patient with left hand pain and swelling.  Has been seen a few days ago and is much worse than then.  More redness more swelling.  Some mild difficulty moving the fingers but doubt tendon involvement.  With the amount of swelling I feels the patient would benefit from admission to the hospital for IV antibiotics.  Had been prescribed doxycycline but had not taken it.  Labs reassuring.  Abscess felt less likely.  Will discuss with hospitalist for admission. Final Clinical Impression(s) /  ED Diagnoses Final diagnoses:  Cellulitis of left hand    Rx / DC Orders ED Discharge Orders     None        Davonna Belling, MD 10/27/20 2322

## 2020-10-27 NOTE — ED Triage Notes (Signed)
Pt states she has had swelling in her hand and arm after receiving Covid Booster on Oct 3rd, Seen at [facility] and Dr's office since due to symptoms.

## 2020-10-27 NOTE — Assessment & Plan Note (Signed)
Post left hand and wrist, severe red and swelling without overt fluctuance or abscess, no evidence of red streaks or sirs/sepsis, and o/w neurovasc intact. Pt confused and did not take doxycycline.    Due to the severity now however, pt is likely a candidate for IV antbx - pt asked to present to ED for further evaluation and tx.

## 2020-10-28 DIAGNOSIS — L03114 Cellulitis of left upper limb: Secondary | ICD-10-CM | POA: Diagnosis not present

## 2020-10-28 DIAGNOSIS — Z79899 Other long term (current) drug therapy: Secondary | ICD-10-CM | POA: Diagnosis not present

## 2020-10-28 DIAGNOSIS — Z7984 Long term (current) use of oral hypoglycemic drugs: Secondary | ICD-10-CM | POA: Diagnosis not present

## 2020-10-28 DIAGNOSIS — E119 Type 2 diabetes mellitus without complications: Secondary | ICD-10-CM | POA: Diagnosis not present

## 2020-10-28 DIAGNOSIS — Z7982 Long term (current) use of aspirin: Secondary | ICD-10-CM | POA: Diagnosis not present

## 2020-10-28 DIAGNOSIS — I10 Essential (primary) hypertension: Secondary | ICD-10-CM | POA: Diagnosis not present

## 2020-10-28 DIAGNOSIS — Z20822 Contact with and (suspected) exposure to covid-19: Secondary | ICD-10-CM | POA: Diagnosis not present

## 2020-10-28 DIAGNOSIS — Z87891 Personal history of nicotine dependence: Secondary | ICD-10-CM | POA: Diagnosis not present

## 2020-10-28 DIAGNOSIS — E039 Hypothyroidism, unspecified: Secondary | ICD-10-CM | POA: Diagnosis not present

## 2020-10-28 LAB — GLUCOSE, CAPILLARY
Glucose-Capillary: 160 mg/dL — ABNORMAL HIGH (ref 70–99)
Glucose-Capillary: 126 mg/dL — ABNORMAL HIGH (ref 70–99)
Glucose-Capillary: 127 mg/dL — ABNORMAL HIGH (ref 70–99)

## 2020-10-28 LAB — LACTIC ACID, PLASMA: Lactic Acid, Venous: 0.8 mmol/L (ref 0.5–1.9)

## 2020-10-28 LAB — HEMOGLOBIN A1C
Hgb A1c MFr Bld: 6.1 % — ABNORMAL HIGH (ref 4.8–5.6)
Mean Plasma Glucose: 128.37 mg/dL

## 2020-10-28 LAB — URIC ACID: Uric Acid, Serum: 6.2 mg/dL (ref 2.5–7.1)

## 2020-10-28 LAB — RESP PANEL BY RT-PCR (FLU A&B, COVID) ARPGX2
Influenza A by PCR: NEGATIVE
Influenza B by PCR: NEGATIVE
SARS Coronavirus 2 by RT PCR: NEGATIVE

## 2020-10-28 MED ORDER — COLCHICINE 0.6 MG PO TABS
1.2000 mg | ORAL_TABLET | Freq: Once | ORAL | Status: AC
  Administered 2020-10-28: 1.2 mg via ORAL
  Filled 2020-10-28: qty 2

## 2020-10-28 MED ORDER — HYDRALAZINE HCL 20 MG/ML IJ SOLN: 10.0000 mg | Freq: Three times a day (TID) | INTRAMUSCULAR | Status: DC | PRN

## 2020-10-28 MED ORDER — SODIUM CHLORIDE 0.9 % IV SOLN: Freq: Once | INTRAVENOUS | Status: AC

## 2020-10-28 MED ORDER — INSULIN ASPART 100 UNIT/ML IJ SOLN
0.0000 [IU] | Freq: Three times a day (TID) | INTRAMUSCULAR | Status: DC
  Administered 2020-10-28: 1 [IU] via SUBCUTANEOUS

## 2020-10-28 MED ORDER — COLCHICINE 0.6 MG PO TABS
0.6000 mg | ORAL_TABLET | Freq: Two times a day (BID) | ORAL | Status: DC
  Administered 2020-10-28 – 2020-10-29 (×2): 0.6 mg via ORAL
  Filled 2020-10-28 (×2): qty 1

## 2020-10-28 MED ORDER — ONDANSETRON HCL 4 MG/2ML IJ SOLN: 4.0000 mg | Freq: Four times a day (QID) | INTRAMUSCULAR | Status: DC | PRN

## 2020-10-28 MED ORDER — HYDROCODONE-ACETAMINOPHEN 5-325 MG PO TABS: 1.0000 | ORAL_TABLET | Freq: Once | ORAL | Status: DC | PRN

## 2020-10-28 MED ORDER — ONDANSETRON HCL 4 MG PO TABS: 4.0000 mg | ORAL_TABLET | Freq: Four times a day (QID) | ORAL | Status: DC | PRN

## 2020-10-28 MED ORDER — CYCLOBENZAPRINE HCL 5 MG PO TABS: 5.0000 mg | ORAL_TABLET | Freq: Three times a day (TID) | ORAL | Status: DC | PRN

## 2020-10-28 MED ORDER — PANTOPRAZOLE SODIUM 40 MG PO TBEC
40.0000 mg | DELAYED_RELEASE_TABLET | Freq: Every day | ORAL | Status: DC
  Administered 2020-10-28 – 2020-10-29 (×2): 40 mg via ORAL
  Filled 2020-10-28 (×2): qty 1

## 2020-10-28 MED ORDER — HYDROMORPHONE HCL 1 MG/ML IJ SOLN
0.5000 mg | Freq: Once | INTRAMUSCULAR | Status: AC
  Administered 2020-10-28: 0.5 mg via INTRAVENOUS
  Filled 2020-10-28: qty 1

## 2020-10-28 MED ORDER — ASPIRIN EC 81 MG PO TBEC
81.0000 mg | DELAYED_RELEASE_TABLET | Freq: Every day | ORAL | Status: DC
  Administered 2020-10-28 – 2020-10-29 (×2): 81 mg via ORAL
  Filled 2020-10-28 (×2): qty 1

## 2020-10-28 MED ORDER — HYDROMORPHONE HCL 1 MG/ML IJ SOLN
0.5000 mg | Freq: Once | INTRAMUSCULAR | Status: AC | PRN
  Administered 2020-10-28: 0.5 mg via INTRAVENOUS
  Filled 2020-10-28: qty 0.5

## 2020-10-28 MED ORDER — ONDANSETRON HCL 4 MG/2ML IJ SOLN
4.0000 mg | Freq: Once | INTRAMUSCULAR | Status: AC | PRN
  Administered 2020-10-28: 4 mg via INTRAVENOUS
  Filled 2020-10-28: qty 2

## 2020-10-28 MED ORDER — SODIUM CHLORIDE 0.9 % IV BOLUS
500.0000 mL | Freq: Once | INTRAVENOUS | Status: AC
  Administered 2020-10-28: 500 mL via INTRAVENOUS

## 2020-10-28 MED ORDER — OXYCODONE HCL 5 MG PO TABS
5.0000 mg | ORAL_TABLET | Freq: Four times a day (QID) | ORAL | Status: DC | PRN
  Administered 2020-10-28 (×3): 5 mg via ORAL
  Filled 2020-10-28 (×3): qty 1

## 2020-10-28 MED ORDER — ACETAMINOPHEN 325 MG PO TABS
650.0000 mg | ORAL_TABLET | Freq: Four times a day (QID) | ORAL | Status: DC | PRN
  Administered 2020-10-29: 650 mg via ORAL
  Filled 2020-10-28: qty 2

## 2020-10-28 MED ORDER — POLYETHYLENE GLYCOL 3350 17 G PO PACK: 17.0000 g | PACK | Freq: Every day | ORAL | Status: DC

## 2020-10-28 MED ORDER — PREGABALIN 75 MG PO CAPS
75.0000 mg | ORAL_CAPSULE | Freq: Every day | ORAL | Status: DC | PRN
  Administered 2020-10-29: 75 mg via ORAL
  Filled 2020-10-28: qty 1

## 2020-10-28 MED ORDER — LEVOTHYROXINE SODIUM 75 MCG PO TABS
150.0000 ug | ORAL_TABLET | Freq: Every day | ORAL | Status: DC
  Administered 2020-10-28 – 2020-10-29 (×2): 150 ug via ORAL
  Filled 2020-10-28 (×2): qty 2

## 2020-10-28 MED ORDER — ATENOLOL 25 MG PO TABS
25.0000 mg | ORAL_TABLET | Freq: Two times a day (BID) | ORAL | Status: DC
  Administered 2020-10-28 – 2020-10-29 (×3): 25 mg via ORAL
  Filled 2020-10-28 (×3): qty 1

## 2020-10-28 MED ORDER — TRIAMCINOLONE ACETONIDE 55 MCG/ACT NA AERO: 2.0000 | INHALATION_SPRAY | NASAL | Status: DC | PRN

## 2020-10-28 MED ORDER — VITAMIN D 25 MCG (1000 UNIT) PO TABS
1000.0000 [IU] | ORAL_TABLET | Freq: Every day | ORAL | Status: DC
  Administered 2020-10-28 – 2020-10-29 (×2): 1000 [IU] via ORAL
  Filled 2020-10-28 (×2): qty 1

## 2020-10-28 MED ORDER — DOCUSATE SODIUM 100 MG PO CAPS
100.0000 mg | ORAL_CAPSULE | Freq: Two times a day (BID) | ORAL | Status: DC
  Administered 2020-10-28 – 2020-10-29 (×3): 100 mg via ORAL
  Filled 2020-10-28 (×3): qty 1

## 2020-10-28 MED ORDER — ATORVASTATIN CALCIUM 40 MG PO TABS
80.0000 mg | ORAL_TABLET | Freq: Every day | ORAL | Status: DC
  Administered 2020-10-28 – 2020-10-29 (×2): 80 mg via ORAL
  Filled 2020-10-28 (×2): qty 2

## 2020-10-28 MED ORDER — MORPHINE SULFATE (PF) 4 MG/ML IV SOLN
4.0000 mg | INTRAVENOUS | Status: DC | PRN
Start: 2020-10-28 — End: 2020-10-29
  Administered 2020-10-28 – 2020-10-29 (×2): 4 mg via INTRAVENOUS
  Filled 2020-10-28 (×3): qty 1

## 2020-10-28 MED ORDER — CLINDAMYCIN PHOSPHATE 600 MG/50ML IV SOLN
600.0000 mg | Freq: Three times a day (TID) | INTRAVENOUS | Status: DC
  Administered 2020-10-28 – 2020-10-29 (×4): 600 mg via INTRAVENOUS
  Filled 2020-10-28 (×6): qty 50

## 2020-10-28 NOTE — H&P (Addendum)
History and Physical    ELLARIE PICKING FKC:[id number] DOB: 1945-10-05 DOA: 10/27/2020  PCP: Biagio Borg, MD  Patient coming from: Home  Chief Complaint: left wrist pain  HPI: [name] is a 75 y.o. female with medical history significant of HLD, HTN, Gout, hypothyroidism. Presenting with left wrist pain and swelling. It started 6 days ago. She does not endorse any trauma or injury to the area. She did not try any medicines at home. After 2 days, she went to the ED and was diagnosed with gout flare. She was sent home with percocet, naproxen and doxycycline. She was also given a referral to orthopedics. She reports taking the percocet, but not the other medications. She saw her PCP yesterday due to the pain worsening. It was recommended that she come to the ED. She denies any other aggravating or alleviating factors.    ED Course: Imaging did not give indication of osteomyelitis. She was started on clinda. [facility] was called for admission.   Review of Systems:  Denies CP, dyspnea, palpitations, abdominal pain, N/V/D, syncopal episodes, sick contacts. Reports constipation, intermittent fevers.  Review of systems is otherwise negative for all not mentioned in HPI.   PMHx Past Medical History:  Diagnosis Date   AAA (abdominal aortic aneurysm)    a. s/p stent graft repair 01/2016.   Acute ischemic colitis ([place]) 07/29/2010   Diagnosed June, 2012 characterized by acute lower GI bleeding, spontaneously resolved.    Acute respiratory infection 06/09/2014   Acute sinus infection 05/14/2015   Allergic rhinitis, cause unspecified    Angioedema of lips 06/03/2014   Anxiety state, unspecified    Arthritis of left hip 04/16/2015   Bilateral hearing loss 07/19/2017   Chronic LBP    Coronary artery disease    a. inferior MI 1998 s/p PCI of RCA. b. stenting of Cx 04/2010. c. DES to prox LAD 05/2013. d. DES x 3 in 10/2015. // Myoview 07/2018:  EF 59, normal perfusion, low risk    Coronary artery disease  involving native heart without angina pectoris 07/31/2006   Qualifier: Diagnosis of  By: [name] MD, [name]  ANGIOGRAPHIC DATA:  1. Ventriculography done in the RAO projection reveals a small wall  motion abnormality in the mid inferior wall. Ejection fraction  would be estimated at around 50%.  2. The right coronary artery has some progressive disease of about 60-  80% in the proximal mid segment. The distal vessel appears to be  50-70% narrowing althou   Degeneration of lumbar or lumbosacral intervertebral disc    Depressive disorder, not elsewhere classified    Diabetes ([place]) 11/03/2006   Qualifier: Diagnosis of  By: [name] MD, [name]    Diabetes mellitus    TYPE II   Frequent urination 05/08/2014   Gout 08/22/2013   History of endovascular stent graft for abdominal aortic aneurysm (AAA) 01/22/2016   Hyperlipidemia    Hypertension    Hypothyroidism    Iron deficiency anemia 06/13/2017   Lower GI bleed 06/2010   Diverticular bleed   Lumbar radiculopathy 05/15/2015   MENOPAUSAL DISORDER 10/29/2007   Qualifier: Diagnosis of  By: [name] MD, [name]    Myalgia 09/25/2013   Noncompliance with medications 02/26/2014   Obesity, unspecified    OTITIS MEDIA, SEROUS, CHRONIC 04/23/2007   Qualifier: Diagnosis of  By: [name] MD, [name]    Thoracic disc disease 11/19/2018   URTICARIA 09/23/2009   Qualifier: Diagnosis of  By: [name] MD, [name]  Venous insufficiency 07/19/2017    PSHx Past Surgical History:  Procedure Laterality Date   CARDIAC CATHETERIZATION     PCI OF BOTH THE CIRCUMFLEX AND LEFT ANTERIOR DESCENDING ARTERY   CARDIAC CATHETERIZATION N/A 10/29/2015   Procedure: Coronary Stent Intervention;  Surgeon: Nelva Bush, MD;  Location: [facility];  Service: Cardiovascular;  Laterality: N/A;   CARDIAC CATHETERIZATION N/A 10/29/2015   Procedure: Coronary/Graft Angiography;  Surgeon: Nelva Bush, MD;  Location: [facility];  Service: Cardiovascular;  Laterality: N/A;    CARDIAC CATHETERIZATION N/A 10/29/2015   Procedure: Intravascular Pressure Wire/FFR Study;  Surgeon: Nelva Bush, MD;  Location: [facility];  Service: Cardiovascular;  Laterality: N/A;   CESAREAN SECTION     ENDOVASCULAR STENT INSERTION N/A 01/22/2016   Procedure: ABDOMINAL AORTIC ENDOVASCULAR STENT GRAFT INSERTION;  Surgeon: Rosetta Posner, MD;  Location: [facility];  Service: Vascular;  Laterality: N/A;   HEART STENT  04-2010  and  Jun 07, 2013   X 3   LEFT HEART CATH AND CORONARY ANGIOGRAPHY N/A 04/03/2019   Procedure: LEFT HEART CATH AND CORONARY ANGIOGRAPHY;  Surgeon: Nelva Bush, MD;  Location: [facility];  Service: Cardiovascular;  Laterality: N/A;   LEFT HEART CATHETERIZATION WITH CORONARY ANGIOGRAM N/A 06/07/2013   Procedure: LEFT HEART CATHETERIZATION WITH CORONARY ANGIOGRAM;  Surgeon: [name], MD;  Location: [facility];  Service: Cardiovascular;  Laterality: N/A;   LEFT HEART CATHETERIZATION WITH CORONARY ANGIOGRAM N/A 02/25/2014   Procedure: LEFT HEART CATHETERIZATION WITH CORONARY ANGIOGRAM;  Surgeon: Troy Sine, MD;  Location: [facility];  Service: Cardiovascular;  Laterality: N/A;   LUMBAR FUSION  01/2007   DR. Patrice Paradise...3-LEVEL WITH FIXATION   OOPHORECTOMY     BSO? pt.unsure   PARATHYROIDECTOMY     SPINE SURGERY     THYROIDECTOMY     TOTAL ABDOMINAL HYSTERECTOMY      SocHx  reports that she quit smoking about 34 years ago. Her smoking use included cigarettes. She has never used smokeless tobacco. She reports that she does not drink alcohol and does not use drugs.  Allergies  Allergen Reactions   Crestor [Rosuvastatin Calcium] Other (See Comments)    Feeling poor   Prilosec [Omeprazole] Other (See Comments)    Chest pain   Miconazole Nitrate Hives    REACTION: hives   Augmentin [Amoxicillin-Pot Clavulanate] Hives, Itching and Rash    Has patient had a PCN reaction causing immediate rash, facial/tongue/throat swelling, SOB or  lightheadedness with hypotension:unsure Has patient had a PCN reaction causing severe rash involving mucus membranes or skin necrosis:unsure Has patient had a PCN reaction that required hospitalization:No Has patient had a PCN reaction occurring within the last 10 years:NO If all of the above answers are "NO", then may proceed with Cephalosporin use. Has patient had a PCN reaction causing immediate rash, facial/tongue/throat swelling, SOB or lightheadedness with hypotension:unsure Has patient had a PCN reaction causing severe rash involving mucus membranes or skin necrosis:unsure Has patient had a PCN reaction that required hospitalization:No Has patient had a PCN reaction occurring within the last 10 years:NO If all of the above answers are "NO", then may proceed with Cephalosporin use.    Doxycycline Other (See Comments)    REACTION: gi upset    FamHx Family History  Problem Relation Age of Onset   Heart attack Mother 63       s/p D&C-CARDIAC ARREST 1966   Heart disease Mother    Heart attack  Father 4       1978 WITH MI   Heart disease Father    Diabetes Brother    Diabetes Maternal Aunt    Arthritis Maternal Aunt    Breast cancer Maternal Aunt        Post menopausal   Colon cancer Neg Hx     Prior to Admission medications   Medication Sig Start Date End Date Taking? Authorizing Provider  allopurinol (ZYLOPRIM) 300 MG tablet Take 1 tablet (300 mg total) by mouth as needed (for Gout). 10/26/20  Yes Biagio Borg, MD  aspirin 81 MG EC tablet Take 81 mg by mouth daily.    Yes [provider]  atenolol (TENORMIN) 25 MG tablet Take 1 tablet (25 mg total) by mouth daily. TAKE 1 TABLET TWICE DAILY Patient taking differently: Take 25 mg by mouth daily. 08/20/20  Yes Biagio Borg, MD  atorvastatin (LIPITOR) 80 MG tablet Take 1 tablet (80 mg total) by mouth daily. 08/03/20 08/03/21 Yes Weaver, Scott T, PA-C  celecoxib (CELEBREX) 200 MG capsule Take 1 capsule (200 mg total) by  mouth 2 (two) times daily as needed for moderate pain. 09/19/19  Yes Biagio Borg, MD  Cholecalciferol (THERA-D 2000) 50 MCG (2000 UT) TABS 1 tab by mouth once daily Patient taking differently: Take 1 tablet by mouth daily at 12 noon. 03/23/20  Yes Biagio Borg, MD  cyclobenzaprine (FLEXERIL) 5 MG tablet Take 1 tablet (5 mg total) by mouth 3 (three) times daily as needed for muscle spasms. 09/19/19  Yes Biagio Borg, MD  naproxen (NAPROSYN) 500 MG tablet Take 1 tablet (500 mg total) by mouth 2 (two) times daily. 10/24/20  Yes Rayna Sexton, PA-C  nitroGLYCERIN (NITROSTAT) 0.4 MG SL tablet Place 1 tablet (0.4 mg total) under the tongue every 5 (five) minutes as needed for chest pain. 12/31/18  Yes Weaver, Scott T, PA-C  oxyCODONE (OXY IR/ROXICODONE) 5 MG immediate release tablet 4 (four) times daily as needed. 10/01/17  Yes [provider]  oxyCODONE-acetaminophen (PERCOCET/ROXICET) 5-325 MG tablet Take 1 tablet by mouth every 8 (eight) hours as needed for severe pain. 10/24/20  Yes Rayna Sexton, PA-C  pantoprazole (PROTONIX) 40 MG tablet Take 40 mg by mouth as needed (heartburn acid reflux).    Yes [provider]  pregabalin (LYRICA) 75 MG capsule Take 75 mg by mouth daily as needed (pain).  11/20/18  Yes [provider]  SYNTHROID 150 MCG tablet TAKE 1 TABLET(150 MCG) BY MOUTH DAILY 08/04/20  Yes Biagio Borg, MD  tiZANidine (ZANAFLEX) 2 MG tablet Take 1 tablet (2 mg total) by mouth every 6 (six) hours as needed for muscle spasms. 03/13/18  Yes Biagio Borg, MD  traMADol (ULTRAM) 50 MG tablet Take 1 tablet (50 mg total) by mouth every 6 (six) hours as needed. 09/17/20  Yes Biagio Borg, MD  triamcinolone (NASACORT) 55 MCG/ACT AERO nasal inhaler Place 2 sprays into the nose as needed (congestion). 09/19/19  Yes Biagio Borg, MD  colchicine 0.6 MG tablet Take 1 tablet (0.6 mg total) by mouth daily as needed (gout pain). Patient not taking: Reported on 10/28/2020 12/23/19    Gregor Hams, MD  docusate sodium (COLACE) 100 MG capsule Take 1 capsule (100 mg total) by mouth 2 (two) times daily as needed for mild constipation. Patient not taking: No sig reported 03/04/19   Joseph Pierini, MD  doxycycline (VIBRAMYCIN) 100 MG capsule Take 1 capsule (100 mg total) by  mouth 2 (two) times daily. Patient not taking: No sig reported 10/24/20   Rayna Sexton, PA-C  ezetimibe (ZETIA) 10 MG tablet Take 1 tablet (10 mg total) by mouth daily. Patient not taking: No sig reported 09/13/19   Richardson Dopp T, PA-C  fexofenadine (ALLEGRA) 180 MG tablet Take 1 tablet (180 mg total) by mouth as needed for allergies or rhinitis. Patient not taking: No sig reported 09/19/19   Biagio Borg, MD  gabapentin (NEURONTIN) 300 MG capsule Take 300 mg by mouth as needed (pain). Patient not taking: No sig reported    [provider]  hydrochlorothiazide (MICROZIDE) 12.5 MG capsule Take 1 capsule (12.5 mg total) by mouth daily. Patient not taking: No sig reported 10/25/17   Biagio Borg, MD  hydrocortisone 1 % lotion Apply 1 application topically 2 (two) times daily. Apply to affected area Patient not taking: No sig reported 03/04/19   Joseph Pierini, MD  indomethacin (INDOCIN) 50 MG capsule Take 1 capsule (50 mg total) by mouth 3 (three) times daily as needed (for gout attack). Patient not taking: Reported on 10/28/2020 11/15/19   Biagio Borg, MD  isosorbide mononitrate (IMDUR) 30 MG 24 hr tablet Take 0.5 tablets (15 mg total) by mouth daily. 07/17/19 10/15/19  Richardson Dopp T, PA-C  metFORMIN (GLUCOPHAGE-XR) 500 MG 24 hr tablet  07/28/19   [provider]  vitamin B-12 (CYANOCOBALAMIN) 1000 MCG tablet Take 1 tablet (1,000 mcg total) by mouth daily. Patient not taking: Reported on 10/28/2020 03/23/20   Biagio Borg, MD    Physical Exam: Vitals:   10/27/20 2330 10/28/20 0411 10/28/20 0417 10/28/20 0455  BP: (!) 134/93 126/78 (!) 127/91 109/74  Pulse: 78 76 76 76  Resp: 18 18  18 16   Temp:   98.9 F (37.2 C) 98.6 F (37 C)  TempSrc:   Oral Oral  SpO2: 100% 99% 100% 100%  Weight:    91.3 kg  Height:    5\' 6"  (1.676 m)    General: 75 y.o. female resting in bed in NAD Eyes: PERRL, normal sclera ENMT: Nares patent w/o discharge, orophaynx clear, dentition normal, ears w/o discharge/lesions/ulcers Neck: Supple, trachea midline Cardiovascular: RRR, +S1, S2, no m/g/r, equal pulses throughout Respiratory: CTABL, no w/r/r, normal WOB GI: BS+, NDNT, no masses noted, no organomegaly noted MSK: No c/c; there is swelling/erythema of left wrist/hand, limited ROM of left hand/wrist d/t pain Neuro: A&O x 3, no focal deficits Psyc: Appropriate interaction and affect, calm/cooperative  Labs on Admission: I have personally reviewed following labs and imaging studies  CBC: Recent Labs  Lab 10/24/20 2013 10/27/20 2003  WBC 10.3 10.2  NEUTROABS 8.0* 7.6  HGB 14.7 13.5  HCT 46.8* 42.3  MCV 90.7 87.4  PLT 291 662   Basic Metabolic Panel: Recent Labs  Lab 10/24/20 2013 10/27/20 2003  NA 143 136  K 4.3 4.0  CL 105 98  CO2 28 28  GLUCOSE 105* 87  BUN 8 7*  CREATININE 0.84 0.71  CALCIUM 9.3 9.4   GFR: Estimated Creatinine Clearance: 69.2 mL/min (by C-G formula based on SCr of 0.71 mg/dL). Liver Function Tests: Recent Labs  Lab 10/24/20 2013  AST 18  ALT 14  ALKPHOS 91  BILITOT 0.8  PROT 8.5*  ALBUMIN 3.8   No results for input(s): LIPASE, AMYLASE in the last 168 hours. No results for input(s): AMMONIA in the last 168 hours. Coagulation Profile: No results for input(s): INR, PROTIME in the last 168 hours.  Cardiac Enzymes: No results for input(s): CKTOTAL, CKMB, CKMBINDEX, TROPONINI in the last 168 hours. BNP (last 3 results) No results for input(s): PROBNP in the last 8760 hours. HbA1C: No results for input(s): HGBA1C in the last 72 hours. CBG: No results for input(s): GLUCAP in the last 168 hours. Lipid Profile: No results for input(s): CHOL,  HDL, LDLCALC, TRIG, CHOLHDL, LDLDIRECT in the last 72 hours. Thyroid Function Tests: No results for input(s): TSH, T4TOTAL, FREET4, T3FREE, THYROIDAB in the last 72 hours. Anemia Panel: No results for input(s): VITAMINB12, FOLATE, FERRITIN, TIBC, IRON, RETICCTPCT in the last 72 hours. Urine analysis:    Component Value Date/Time   COLORURINE YELLOW 03/19/2020 1708   APPEARANCEUR CLEAR 03/19/2020 1708   LABSPEC 1.015 03/19/2020 1708   PHURINE 6.0 03/19/2020 1708   GLUCOSEU NEGATIVE 03/19/2020 1708   HGBUR TRACE-INTACT (A) 03/19/2020 1708   HGBUR negative 06/22/2007 1400   BILIRUBINUR NEGATIVE 03/19/2020 1708   BILIRUBINUR negative 01/16/2018 0939   KETONESUR NEGATIVE 03/19/2020 1708   PROTEINUR Negative 01/16/2018 0939   PROTEINUR NEGATIVE 06/11/2017 0345   UROBILINOGEN 2.0 (A) 03/19/2020 1708   NITRITE POSITIVE (A) 03/19/2020 1708   LEUKOCYTESUR SMALL (A) 03/19/2020 1708    Radiological Exams on Admission: DG Hand Complete Left  Result Date: 10/27/2020 CLINICAL DATA:  Infection. EXAM: LEFT HAND - COMPLETE 3+ VIEW COMPARISON:  X-ray left wrist 10/24/2020. FINDINGS: Severe degenerative changes of the first carpometacarpal joint. At least moderate degenerative changes of the proximal interphalangeal joints. At least moderate degenerative changes of the distal interphalangeal joints. No cortical erosion or destruction. There is no evidence of fracture or dislocation. There is no other focal bone abnormality. Subcutaneus soft tissue edema. No retained radiopaque foreign body. IMPRESSION: 1. No acute displaced fracture or dislocation. 2. No radiographic findings suggest osteomyelitis. Electronically Signed   By: Iven Finn M.D.   On: 10/27/2020 20:17    EKG: None obtained in ED  Assessment/Plan Cellulitis of the wrist     - place in obs, med-surg     - gout vs infection     - started on clinda, can continue for now     - lets check uric acid; give colchicine for possible gout  flare; she denies a gout diagnosis: "They tried to diagnose me with gout, but I don't think I have it. I don't take that medicine."     - imaging thus far has been reassuring     - pain control  HLD     - continue home statin  HTN     - continue atenolol  Hypothyroidism     - continue synthroid  GERD     - continue PPI  Constipation     - colace     - colchicine should also help  DM2     - A1c, glucose checks, DM diet     - says she's diet controlled at this point  Chronic pain     - continue home regimen  DVT prophylaxis: lovenox  Code Status: FULL  Family Communication: None at bedside  Consults called: None   Status is: Observation  The patient remains OBS appropriate and will d/c before 2 midnights.  Dispo: The patient is from: Home              Anticipated d/c is to: Home              Patient currently is not medically stable to d/c.   Difficult to place patient  No  Armour Villanueva A Shrika Milos DO [facility]  If 7PM-7AM, please contact night-coverage [email or web address]  10/28/2020, 7:08 AM

## 2020-10-28 NOTE — ED Notes (Signed)
[facility] called for Transfer at this Time.

## 2020-10-28 NOTE — ED Notes (Signed)
Care Handoff/Report given to [facility] at this Time. All Questions answered. 

## 2020-10-29 ENCOUNTER — Encounter (HOSPITAL_COMMUNITY): Payer: Self-pay | Admitting: *Deleted

## 2020-10-29 ENCOUNTER — Emergency Department (HOSPITAL_COMMUNITY)
Admission: EM | Admit: 2020-10-29 | Discharge: 2020-10-30 | Disposition: A | Discharge status: Home / Self Care | Payer: Medicare HMO | Source: Home / Self Care | Attending: Emergency Medicine | Admitting: Emergency Medicine

## 2020-10-29 DIAGNOSIS — I10 Essential (primary) hypertension: Secondary | ICD-10-CM | POA: Insufficient documentation

## 2020-10-29 DIAGNOSIS — L03114 Cellulitis of left upper limb: Secondary | ICD-10-CM | POA: Insufficient documentation

## 2020-10-29 DIAGNOSIS — E119 Type 2 diabetes mellitus without complications: Secondary | ICD-10-CM | POA: Insufficient documentation

## 2020-10-29 DIAGNOSIS — I2510 Atherosclerotic heart disease of native coronary artery without angina pectoris: Secondary | ICD-10-CM | POA: Insufficient documentation

## 2020-10-29 DIAGNOSIS — L0390 Cellulitis, unspecified: Secondary | ICD-10-CM

## 2020-10-29 DIAGNOSIS — Z79899 Other long term (current) drug therapy: Secondary | ICD-10-CM | POA: Insufficient documentation

## 2020-10-29 DIAGNOSIS — Z87891 Personal history of nicotine dependence: Secondary | ICD-10-CM | POA: Insufficient documentation

## 2020-10-29 DIAGNOSIS — Z7982 Long term (current) use of aspirin: Secondary | ICD-10-CM | POA: Insufficient documentation

## 2020-10-29 DIAGNOSIS — R609 Edema, unspecified: Secondary | ICD-10-CM | POA: Diagnosis not present

## 2020-10-29 DIAGNOSIS — E039 Hypothyroidism, unspecified: Secondary | ICD-10-CM | POA: Insufficient documentation

## 2020-10-29 DIAGNOSIS — M109 Gout, unspecified: Secondary | ICD-10-CM | POA: Insufficient documentation

## 2020-10-29 DIAGNOSIS — M79603 Pain in arm, unspecified: Secondary | ICD-10-CM | POA: Diagnosis not present

## 2020-10-29 LAB — COMPREHENSIVE METABOLIC PANEL
ALT: 17 U/L (ref 0–44)
AST: 26 U/L (ref 15–41)
Albumin: 2.9 g/dL — ABNORMAL LOW (ref 3.5–5.0)
Alkaline Phosphatase: 72 U/L (ref 38–126)
Anion gap: 9 (ref 5–15)
BUN: 10 mg/dL (ref 8–23)
CO2: 24 mmol/L (ref 22–32)
Calcium: 8.5 mg/dL — ABNORMAL LOW (ref 8.9–10.3)
Chloride: 106 mmol/L (ref 98–111)
Creatinine, Ser: 0.81 mg/dL (ref 0.44–1.00)
GFR, Estimated: >60 mL/min (ref >60)
Glucose, Bld: 119 mg/dL — ABNORMAL HIGH (ref 70–99)
Potassium: 4 mmol/L (ref 3.5–5.1)
Sodium: 139 mmol/L (ref 135–145)
Total Bilirubin: 0.9 mg/dL (ref 0.3–1.2)
Total Protein: 7 g/dL (ref 6.5–8.1)

## 2020-10-29 LAB — CBC WITH DIFFERENTIAL/PLATELET
Abs Immature Granulocytes: 0.04 10*3/uL (ref 0.00–0.07)
Basophils Absolute: 0 10*3/uL (ref 0.0–0.1)
Basophils Relative: 0 %
Eosinophils Absolute: 0.1 10*3/uL (ref 0.0–0.5)
Eosinophils Relative: 1 %
HCT: 42.6 % (ref 36.0–46.0)
Hemoglobin: 13.3 g/dL (ref 12.0–15.0)
Immature Granulocytes: 1 %
Lymphocytes Relative: 22 %
Lymphs Abs: 1.7 10*3/uL (ref 0.7–4.0)
MCH: 28.2 pg (ref 26.0–34.0)
MCHC: 31.2 g/dL (ref 30.0–36.0)
MCV: 90.4 fL (ref 80.0–100.0)
Monocytes Absolute: 0.6 10*3/uL (ref 0.1–1.0)
Monocytes Relative: 7 %
Neutro Abs: 5.6 10*3/uL (ref 1.7–7.7)
Neutrophils Relative %: 69 %
Platelets: 324 10*3/uL (ref 150–400)
RBC: 4.71 MIL/uL (ref 3.87–5.11)
RDW: 14.1 % (ref 11.5–15.5)
WBC: 8 10*3/uL (ref 4.0–10.5)
nRBC: 0 % (ref 0.0–0.2)

## 2020-10-29 LAB — CBC
HCT: 41.4 % (ref 36.0–46.0)
Hemoglobin: 13 g/dL (ref 12.0–15.0)
MCH: 28.3 pg (ref 26.0–34.0)
MCHC: 31.4 g/dL (ref 30.0–36.0)
MCV: 90.2 fL (ref 80.0–100.0)
Platelets: 296 10*3/uL (ref 150–400)
RBC: 4.59 MIL/uL (ref 3.87–5.11)
RDW: 14.3 % (ref 11.5–15.5)
WBC: 8.4 10*3/uL (ref 4.0–10.5)
nRBC: 0 % (ref 0.0–0.2)

## 2020-10-29 LAB — BASIC METABOLIC PANEL
Anion gap: 11 (ref 5–15)
BUN: 9 mg/dL (ref 8–23)
CO2: 27 mmol/L (ref 22–32)
Calcium: 8.6 mg/dL — ABNORMAL LOW (ref 8.9–10.3)
Chloride: 98 mmol/L (ref 98–111)
Creatinine, Ser: 0.99 mg/dL (ref 0.44–1.00)
GFR, Estimated: 59 mL/min — ABNORMAL LOW (ref >60)
Glucose, Bld: 125 mg/dL — ABNORMAL HIGH (ref 70–99)
Potassium: 3.7 mmol/L (ref 3.5–5.1)
Sodium: 136 mmol/L (ref 135–145)

## 2020-10-29 LAB — GLUCOSE, CAPILLARY
Glucose-Capillary: 114 mg/dL — ABNORMAL HIGH (ref 70–99)
Glucose-Capillary: 113 mg/dL — ABNORMAL HIGH (ref 70–99)

## 2020-10-29 MED ORDER — OXYCODONE-ACETAMINOPHEN 5-325 MG PO TABS: 1.0000 | ORAL_TABLET | Freq: Four times a day (QID) | ORAL | 0 refills | Status: AC | PRN

## 2020-10-29 MED ORDER — COLCHICINE 0.6 MG PO TABS: 0.6000 mg | ORAL_TABLET | Freq: Every day | ORAL | 0 refills | Status: DC

## 2020-10-29 MED ORDER — CLINDAMYCIN HCL 300 MG PO CAPS: 300.0000 mg | ORAL_CAPSULE | Freq: Three times a day (TID) | ORAL | 0 refills | Status: AC

## 2020-10-29 MED ORDER — ACETAMINOPHEN 325 MG PO TABS: 650.0000 mg | ORAL_TABLET | Freq: Once | ORAL | Status: DC

## 2020-10-29 MED ORDER — ATORVASTATIN CALCIUM 80 MG PO TABS: 80.0000 mg | ORAL_TABLET | Freq: Every day | ORAL | 1 refills | Status: DC

## 2020-10-29 NOTE — ED Triage Notes (Signed)
Pt arrived by [facility] from home. Reports having left arm pain after having flu shot 1 week ago. Pain radiates down into her hand and has swelling. Was seen at [facility] last night for same and has not filled prescriptions.

## 2020-10-29 NOTE — ED Provider Notes (Signed)
Emergency Medicine Provider Triage Evaluation Note  [name] , a 75 y.o. female  was evaluated in triage.  Pt complains of left hand pain that began 3 days. She was seen and evaluated at [facility] a couple of days ago and was given doxycycline.  Patient just filled it up today.  Hand pain is increasing which prompted her arrival today.  She reports that she had swelling and redness.  Denies any fevers or chills.   Review of Systems  Positive:  Negative: See above  Physical Exam  BP 102/62 (BP Location: Right Arm)   Pulse 70   Resp 16   SpO2 95%  Gen:   Awake, no distress   Resp:  Normal effort  MSK:   Moves extremities without difficulty  Other:  Left hand is swollen and erythematous.  He does not warm to touch.  Mildly tender to palpation.  Medical Decision Making  Medically screening exam initiated at 9:36 PM.  Appropriate orders placed.  [name] was informed that the remainder of the evaluation will be completed by another provider, this initial triage assessment does not replace that evaluation, and the importance of remaining in the ED until their evaluation is complete.     Cherrie Gauze 10/29/20 2138    Davonna Belling, MD 10/29/20 2227

## 2020-10-29 NOTE — Care Management Obs Status (Signed)
[facility] NOTIFICATION   Patient Details  Name: [name] MRN: [id number] Date of Birth: 07/21/1945   Medicare Observation Status Notification Given:  Yes    MahabirJuliann Pulse, RN 10/29/2020, 12:46 PM

## 2020-10-29 NOTE — Discharge Summary (Signed)
Physician Discharge Summary  [name] WIO:[id number] DOB: Oct 22, 1945 DOA: 10/27/2020  PCP: Biagio Borg, MD  Admit date: 10/27/2020 Discharge date: 10/29/2020  Admitted From: Home Disposition: Home  Recommendations for Outpatient Follow-up:  Follow up with PCP in 1 week Please obtain BMP/CBC in one week Please follow up on the following pending results: None  Home Health: None Equipment/Devices: None  Discharge Condition: Stable CODE STATUS: Full code Diet recommendation: Regular diet   Brief/Interim Summary:  Admission HPI written by Cherylann Ratel, DO  HPI: [name] is a 75 y.o. female with medical history significant of HLD, HTN, Gout, hypothyroidism. Presenting with left wrist pain and swelling. It started 6 days ago. She does not endorse any trauma or injury to the area. She did not try any medicines at home. After 2 days, she went to the ED and was diagnosed with gout flare. She was sent home with percocet, naproxen and doxycycline. She was also given a referral to orthopedics. She reports taking the percocet, but not the other medications. She saw her PCP yesterday due to the pain worsening. It was recommended that she come to the ED. She denies any other aggravating or alleviating factors.   Hospital course:  Cellulitis of left hand Started on Clindamycin IV on admission (allergy/intolerance to penicillin/doxycycline). No associated fever or leukocytosis. No evidence for osteomyelitis on x-ray. Patient transitioned to Clindamycin PO on discharge.  Possible gout Continue colchicine for three more days. Hold Lipitor while on colchicine.  Hyperlipidemia Hypertension Hypothyroidism GERD Diabetes mellitus type 2 No changes to regimen  Discharge Diagnoses:  Active Problems:   Cellulitis of left hand    Discharge Instructions   Allergies as of 10/29/2020       Reactions   Crestor [rosuvastatin Calcium] Other (See Comments)   Feeling poor    Prilosec [omeprazole] Other (See Comments)   Chest pain   Miconazole Nitrate Hives   REACTION: hives   Augmentin [amoxicillin-pot Clavulanate] Hives, Itching, Rash   Has patient had a PCN reaction causing immediate rash, facial/tongue/throat swelling, SOB or lightheadedness with hypotension:unsure Has patient had a PCN reaction causing severe rash involving mucus membranes or skin necrosis:unsure Has patient had a PCN reaction that required hospitalization:No Has patient had a PCN reaction occurring within the last 10 years:NO If all of the above answers are "NO", then may proceed with Cephalosporin use. Has patient had a PCN reaction causing immediate rash, facial/tongue/throat swelling, SOB or lightheadedness with hypotension:unsure Has patient had a PCN reaction causing severe rash involving mucus membranes or skin necrosis:unsure Has patient had a PCN reaction that required hospitalization:No Has patient had a PCN reaction occurring within the last 10 years:NO If all of the above answers are "NO", then may proceed with Cephalosporin use.   Doxycycline Other (See Comments)   REACTION: gi upset        Medication List     STOP taking these medications    cyclobenzaprine 5 MG tablet Commonly known as: FLEXERIL   doxycycline 100 MG capsule Commonly known as: VIBRAMYCIN   fexofenadine 180 MG tablet Commonly known as: ALLEGRA   gabapentin 300 MG capsule Commonly known as: NEURONTIN   hydrochlorothiazide 12.5 MG capsule Commonly known as: MICROZIDE   hydrocortisone 1 % lotion   indomethacin 50 MG capsule Commonly known as: INDOCIN   naproxen 500 MG tablet Commonly known as: NAPROSYN   oxyCODONE 5 MG immediate release tablet Commonly known as: Oxy IR/ROXICODONE   traMADol 50 MG  tablet Commonly known as: ULTRAM       TAKE these medications    allopurinol 300 MG tablet Commonly known as: ZYLOPRIM Take 1 tablet (300 mg total) by mouth as needed (for Gout).    aspirin 81 MG EC tablet Take 81 mg by mouth daily.   atenolol 25 MG tablet Commonly known as: TENORMIN Take 1 tablet (25 mg total) by mouth daily. TAKE 1 TABLET TWICE DAILY What changed: additional instructions   atorvastatin 80 MG tablet Commonly known as: LIPITOR Take 1 tablet (80 mg total) by mouth daily. Start taking on: November 02, 2020 What changed: These instructions start on November 02, 2020. If you are unsure what to do until then, ask your doctor or other care provider.   celecoxib 200 MG capsule Commonly known as: CELEBREX Take 1 capsule (200 mg total) by mouth 2 (two) times daily as needed for moderate pain.   clindamycin 300 MG capsule Commonly known as: CLEOCIN Take 1 capsule (300 mg total) by mouth 3 (three) times daily for 6 days.   colchicine 0.6 MG tablet Take 1 tablet (0.6 mg total) by mouth daily for 3 days. Start taking on: October 30, 2020 What changed:  when to take this reasons to take this   docusate sodium 100 MG capsule Commonly known as: Colace Take 1 capsule (100 mg total) by mouth 2 (two) times daily as needed for mild constipation.   ezetimibe 10 MG tablet Commonly known as: ZETIA Take 1 tablet (10 mg total) by mouth daily.   isosorbide mononitrate 30 MG 24 hr tablet Commonly known as: IMDUR Take 0.5 tablets (15 mg total) by mouth daily.   metFORMIN 500 MG 24 hr tablet Commonly known as: GLUCOPHAGE-XR   nitroGLYCERIN 0.4 MG SL tablet Commonly known as: NITROSTAT Place 1 tablet (0.4 mg total) under the tongue every 5 (five) minutes as needed for chest pain.   oxyCODONE-acetaminophen 5-325 MG tablet Commonly known as: PERCOCET/ROXICET Take 1 tablet by mouth every 6 (six) hours as needed for up to 5 days for severe pain. What changed: when to take this   pantoprazole 40 MG tablet Commonly known as: PROTONIX Take 40 mg by mouth as needed (heartburn acid reflux).   pregabalin 75 MG capsule Commonly known as: LYRICA Take 75 mg by  mouth daily as needed (pain).   Synthroid 150 MCG tablet Generic drug: levothyroxine TAKE 1 TABLET(150 MCG) BY MOUTH DAILY   Thera-D 2000 50 MCG (2000 UT) Tabs Generic drug: Cholecalciferol 1 tab by mouth once daily What changed:  how much to take how to take this when to take this additional instructions   tiZANidine 2 MG tablet Commonly known as: ZANAFLEX Take 1 tablet (2 mg total) by mouth every 6 (six) hours as needed for muscle spasms.   triamcinolone 55 MCG/ACT Aero nasal inhaler Commonly known as: NASACORT Place 2 sprays into the nose as needed (congestion).   vitamin B-12 1000 MCG tablet Commonly known as: CYANOCOBALAMIN Take 1 tablet (1,000 mcg total) by mouth daily.        Allergies  Allergen Reactions   Crestor [Rosuvastatin Calcium] Other (See Comments)    Feeling poor   Prilosec [Omeprazole] Other (See Comments)    Chest pain   Miconazole Nitrate Hives    REACTION: hives   Augmentin [Amoxicillin-Pot Clavulanate] Hives, Itching and Rash    Has patient had a PCN reaction causing immediate rash, facial/tongue/throat swelling, SOB or lightheadedness with hypotension:unsure Has patient had a PCN reaction  causing severe rash involving mucus membranes or skin necrosis:unsure Has patient had a PCN reaction that required hospitalization:No Has patient had a PCN reaction occurring within the last 10 years:NO If all of the above answers are "NO", then may proceed with Cephalosporin use. Has patient had a PCN reaction causing immediate rash, facial/tongue/throat swelling, SOB or lightheadedness with hypotension:unsure Has patient had a PCN reaction causing severe rash involving mucus membranes or skin necrosis:unsure Has patient had a PCN reaction that required hospitalization:No Has patient had a PCN reaction occurring within the last 10 years:NO If all of the above answers are "NO", then may proceed with Cephalosporin use.    Doxycycline Other (See Comments)     REACTION: gi upset    Consultations: None   Procedures/Studies: DG Wrist Complete Left  Result Date: 10/24/2020 CLINICAL DATA:  Atraumatic left wrist pain EXAM: LEFT WRIST - COMPLETE 3+ VIEW COMPARISON:  None. FINDINGS: Four view radiograph left wrist demonstrates severe degenerative arthritis of the first carpometacarpal joint at the base of the thumb with radial subluxation of the a first metacarpal in relation to the trapezium. Remaining joint spaces are preserved. No acute fracture or dislocation there is mild soft tissue swelling noted along the dorsum of the carpus. IMPRESSION: Advanced degenerative arthritis of the first carpometacarpal joint at the base of the thumb. Dorsal soft tissue swelling. Electronically Signed   By: Fidela Salisbury M.D.   On: 10/24/2020 19:42   DG Hand Complete Left  Result Date: 10/27/2020 CLINICAL DATA:  Infection. EXAM: LEFT HAND - COMPLETE 3+ VIEW COMPARISON:  X-ray left wrist 10/24/2020. FINDINGS: Severe degenerative changes of the first carpometacarpal joint. At least moderate degenerative changes of the proximal interphalangeal joints. At least moderate degenerative changes of the distal interphalangeal joints. No cortical erosion or destruction. There is no evidence of fracture or dislocation. There is no other focal bone abnormality. Subcutaneus soft tissue edema. No retained radiopaque foreign body. IMPRESSION: 1. No acute displaced fracture or dislocation. 2. No radiographic findings suggest osteomyelitis. Electronically Signed   By: Iven Finn M.D.   On: 10/27/2020 20:17   [place] LIMITED JOINT SPACE STRUCTURES LOW BILAT(NO LINKED CHARGES)  Result Date: 10/14/2020 Procedure: Real-time Ultrasound Guided Injection of right knee superior lateral patellar space Device: Philips Affiniti 50G Images permanently stored and available for review in PACS Verbal informed consent obtained.  Discussed risks and benefits of procedure. Warned about infection bleeding  damage to structures skin hypopigmentation and fat atrophy among others. Patient expresses understanding and agreement Time-out conducted.   Noted no overlying erythema, induration, or other signs of local infection.   Skin prepped in a sterile fashion.   Local anesthesia: Topical Ethyl chloride.   With sterile technique and under real time ultrasound guidance: 40 mg of Kenalog and 2 mL of lidocaine injected into knee joint. Fluid seen entering the joint capsule.   Completed without difficulty   Pain immediately resolved suggesting accurate placement of the medication.   Advised to call if fevers/chills, erythema, induration, drainage, or persistent bleeding.   Images permanently stored and available for review in the ultrasound unit. Impression: Technically successful ultrasound guided injection.       Procedure: Real-time Ultrasound Guided Injection of left knee superior lateral patellar space Device: Philips Affiniti 50G Images permanently stored and available for review in PACS Verbal informed consent obtained.  Discussed risks and benefits of procedure. Warned about infection bleeding damage to structures skin hypopigmentation and fat atrophy among others. Patient expresses understanding and agreement  Time-out conducted.   Noted no overlying erythema, induration, or other signs of local infection.   Skin prepped in a sterile fashion.   Local anesthesia: Topical Ethyl chloride.   With sterile technique and under real time ultrasound guidance: 40 mg of Kenalog and 2 mL of Marcaine injected into knee joint. Fluid seen entering the joint capsule.   Completed without difficulty   Pain immediately resolved suggesting accurate placement of the medication.   Advised to call if fevers/chills, erythema, induration, drainage, or persistent bleeding.   Images permanently stored and available for review in the ultrasound unit. Impression: Technically successful ultrasound guided injection.      Subjective: Some  continued left hand pain but it has improved.  Discharge Exam: Vitals:   10/28/20 2136 10/29/20 0550  BP:  95/62  Pulse:  80  Resp:  14  Temp:  98.4 F (36.9 C)  SpO2: 99% 100%   Vitals:   10/28/20 1320 10/28/20 2058 10/28/20 2136 10/29/20 0550  BP: 98/66 99/70  95/62  Pulse: 92 82  80  Resp: 18 16  14   Temp: 98.4 F (36.9 C) 99.2 F (37.3 C)  98.4 F (36.9 C)  TempSrc: Oral Oral  Oral  SpO2: 99% 90% 99% 100%  Weight:      Height:        General: Pt is alert, awake, not in acute distress Cardiovascular: RRR, S1/S2 +, no rubs, no gallops Respiratory: CTA bilaterally, no wheezing, no rhonchi Abdominal: Soft, NT, ND, bowel sounds + Extremities: Left hand and wrist with dorsal erythema and some tenderness without obvious effusion. No purulent drainage or abscess noted    The results of significant diagnostics from this hospitalization (including imaging, microbiology, ancillary and laboratory) are listed below for reference.     Microbiology: Recent Results (from the past 240 hour(s))  Resp Panel by RT-PCR (Flu A&B, Covid) Nasopharyngeal Swab     Status: None   Collection Time: 10/28/20 12:49 AM   Specimen: Nasopharyngeal Swab; Nasopharyngeal(NP) swabs in vial transport medium  Result Value Ref Range Status   SARS Coronavirus 2 by RT PCR NEGATIVE NEGATIVE Final    Comment: (NOTE) SARS-CoV-2 target nucleic acids are NOT DETECTED.  The SARS-CoV-2 RNA is generally detectable in upper respiratory specimens during the acute phase of infection. The lowest concentration of SARS-CoV-2 viral copies this assay can detect is 138 copies/mL. A negative result does not preclude SARS-Cov-2 infection and should not be used as the sole basis for treatment or other patient management decisions. A negative result may occur with  improper specimen collection/handling, submission of specimen other than nasopharyngeal swab, presence of viral mutation(s) within the areas targeted by this  assay, and inadequate number of viral copies(<138 copies/mL). A negative result must be combined with clinical observations, patient history, and epidemiological information. The expected result is Negative.  Fact Sheet for Patients:  EntrepreneurPulse.com.au  Fact Sheet for Healthcare Providers:  IncredibleEmployment.be  This test is no t yet approved or cleared by the [place] FDA and  has been authorized for detection and/or diagnosis of SARS-CoV-2 by FDA under an Emergency Use Authorization (EUA). This EUA will remain  in effect (meaning this test can be used) for the duration of the COVID-19 declaration under Section 564(b)(1) of the Act, 21 U.S.C.section 360bbb-3(b)(1), unless the authorization is terminated  or revoked sooner.       Influenza A by PCR NEGATIVE NEGATIVE Final   Influenza B by PCR NEGATIVE NEGATIVE Final  Comment: (NOTE) The Xpert Xpress SARS-CoV-2/FLU/RSV plus assay is intended as an aid in the diagnosis of influenza from Nasopharyngeal swab specimens and should not be used as a sole basis for treatment. Nasal washings and aspirates are unacceptable for Xpert Xpress SARS-CoV-2/FLU/RSV testing.  Fact Sheet for Patients: EntrepreneurPulse.com.au  Fact Sheet for Healthcare Providers: IncredibleEmployment.be  This test is not yet approved or cleared by the [place] FDA and has been authorized for detection and/or diagnosis of SARS-CoV-2 by FDA under an Emergency Use Authorization (EUA). This EUA will remain in effect (meaning this test can be used) for the duration of the COVID-19 declaration under Section 564(b)(1) of the Act, 21 U.S.C. section 360bbb-3(b)(1), unless the authorization is terminated or revoked.  Performed at [facility], [address], [place], [place] [address]      Labs:  Basic Metabolic Panel: Recent Labs  Lab  10/24/20 2013 10/27/20 2003 10/29/20 0349  NA 143 136 139  K 4.3 4.0 4.0  CL 105 98 106  CO2 28 28 24   GLUCOSE 105* 87 119*  BUN 8 7* 10  CREATININE 0.84 0.71 0.81  CALCIUM 9.3 9.4 8.5*   Liver Function Tests: Recent Labs  Lab 10/24/20 2013 10/29/20 0349  AST 18 26  ALT 14 17  ALKPHOS 91 72  BILITOT 0.8 0.9  PROT 8.5* 7.0  ALBUMIN 3.8 2.9*   No results for input(s): LIPASE, AMYLASE in the last 168 hours. No results for input(s): AMMONIA in the last 168 hours. CBC: Recent Labs  Lab 10/24/20 2013 10/27/20 2003 10/29/20 0349  WBC 10.3 10.2 8.4  NEUTROABS 8.0* 7.6  --   HGB 14.7 13.5 13.0  HCT 46.8* 42.3 41.4  MCV 90.7 87.4 90.2  PLT 291 310 296   CBG: Recent Labs  Lab 10/28/20 1127 10/28/20 1640 10/28/20 2104 10/29/20 0732  GLUCAP 160* 126* 127* 114*    Hgb A1c Recent Labs    10/28/20 0542  HGBA1C 6.1*    Urinalysis    Component Value Date/Time   COLORURINE YELLOW 03/19/2020 1708   APPEARANCEUR CLEAR 03/19/2020 1708   LABSPEC 1.015 03/19/2020 1708   PHURINE 6.0 03/19/2020 1708   GLUCOSEU NEGATIVE 03/19/2020 1708   HGBUR TRACE-INTACT (A) 03/19/2020 1708   HGBUR negative 06/22/2007 1400   BILIRUBINUR NEGATIVE 03/19/2020 1708   BILIRUBINUR negative 01/16/2018 0939   KETONESUR NEGATIVE 03/19/2020 1708   PROTEINUR Negative 01/16/2018 0939   PROTEINUR NEGATIVE 06/11/2017 0345   UROBILINOGEN 2.0 (A) 03/19/2020 1708   NITRITE POSITIVE (A) 03/19/2020 1708   LEUKOCYTESUR SMALL (A) 03/19/2020 1708   Sepsis Labs Invalid input(s): PROCALCITONIN,  WBC,  LACTICIDVEN Microbiology Recent Results (from the past 240 hour(s))  Resp Panel by RT-PCR (Flu A&B, Covid) Nasopharyngeal Swab     Status: None   Collection Time: 10/28/20 12:49 AM   Specimen: Nasopharyngeal Swab; Nasopharyngeal(NP) swabs in vial transport medium  Result Value Ref Range Status   SARS Coronavirus 2 by RT PCR NEGATIVE NEGATIVE Final    Comment: (NOTE) SARS-CoV-2 target nucleic acids  are NOT DETECTED.  The SARS-CoV-2 RNA is generally detectable in upper respiratory specimens during the acute phase of infection. The lowest concentration of SARS-CoV-2 viral copies this assay can detect is 138 copies/mL. A negative result does not preclude SARS-Cov-2 infection and should not be used as the sole basis for treatment or other patient management decisions. A negative result may occur with  improper specimen collection/handling, submission of specimen other than nasopharyngeal swab, presence of viral  mutation(s) within the areas targeted by this assay, and inadequate number of viral copies(<138 copies/mL). A negative result must be combined with clinical observations, patient history, and epidemiological information. The expected result is Negative.  Fact Sheet for Patients:  EntrepreneurPulse.com.au  Fact Sheet for Healthcare Providers:  IncredibleEmployment.be  This test is no t yet approved or cleared by the [place] FDA and  has been authorized for detection and/or diagnosis of SARS-CoV-2 by FDA under an Emergency Use Authorization (EUA). This EUA will remain  in effect (meaning this test can be used) for the duration of the COVID-19 declaration under Section 564(b)(1) of the Act, 21 U.S.C.section 360bbb-3(b)(1), unless the authorization is terminated  or revoked sooner.       Influenza A by PCR NEGATIVE NEGATIVE Final   Influenza B by PCR NEGATIVE NEGATIVE Final    Comment: (NOTE) The Xpert Xpress SARS-CoV-2/FLU/RSV plus assay is intended as an aid in the diagnosis of influenza from Nasopharyngeal swab specimens and should not be used as a sole basis for treatment. Nasal washings and aspirates are unacceptable for Xpert Xpress SARS-CoV-2/FLU/RSV testing.  Fact Sheet for Patients: EntrepreneurPulse.com.au  Fact Sheet for Healthcare Providers: IncredibleEmployment.be  This test is  not yet approved or cleared by the [place] FDA and has been authorized for detection and/or diagnosis of SARS-CoV-2 by FDA under an Emergency Use Authorization (EUA). This EUA will remain in effect (meaning this test can be used) for the duration of the COVID-19 declaration under Section 564(b)(1) of the Act, 21 U.S.C. section 360bbb-3(b)(1), unless the authorization is terminated or revoked.  Performed at [facility], [address] [place], [place] [address]     SIGNED:   Cordelia Poche, MD [facility] 10/29/2020, 11:35 AM

## 2020-10-29 NOTE — Progress Notes (Signed)
Discharge instructions given. Patient verbalized understanding and all questions were answered.  ?

## 2020-10-30 ENCOUNTER — Other Ambulatory Visit: Payer: Self-pay

## 2020-10-30 ENCOUNTER — Encounter: Payer: Self-pay | Admitting: Internal Medicine

## 2020-10-30 ENCOUNTER — Other Ambulatory Visit: Payer: Medicare HMO | Admitting: *Deleted

## 2020-10-30 DIAGNOSIS — L03114 Cellulitis of left upper limb: Secondary | ICD-10-CM | POA: Diagnosis not present

## 2020-10-30 DIAGNOSIS — I25118 Atherosclerotic heart disease of native coronary artery with other forms of angina pectoris: Secondary | ICD-10-CM | POA: Diagnosis not present

## 2020-10-30 DIAGNOSIS — E782 Mixed hyperlipidemia: Secondary | ICD-10-CM | POA: Diagnosis not present

## 2020-10-30 DIAGNOSIS — M109 Gout, unspecified: Secondary | ICD-10-CM | POA: Diagnosis not present

## 2020-10-30 MED ORDER — OXYCODONE-ACETAMINOPHEN 5-325 MG PO TABS
2.0000 | ORAL_TABLET | Freq: Once | ORAL | Status: AC
  Administered 2020-10-30: 2 via ORAL
  Filled 2020-10-30: qty 2

## 2020-10-30 MED ORDER — OXYCODONE HCL 5 MG PO TABS: 5.0000 mg | ORAL_TABLET | Freq: Four times a day (QID) | ORAL | 0 refills | Status: DC | PRN

## 2020-10-30 NOTE — ED Provider Notes (Signed)
[facility] EMERGENCY DEPARTMENT Provider Note   CSN: [id number] Arrival date & time: 10/29/20  2109     History Chief Complaint  Patient presents with   Arm Pain    [name] is a 75 y.o. female.   Arm Pain   Patient presents to the ED for evaluation of persistent pain and swelling in her left hand .  Patient was initially seen in the emergency room on October 8.  At that time she was complaining of pain and swelling of her left wrist.  Patient had x-rays at that time that showed evidence of degenerative arthritis.  Patient also has history of gout.  Symptoms felt to be likely related to gout and not a septic joint.  Patient was given a prescription for doxycycline and naproxen.  Patient did not take naproxen or doxycycline..  Her symptoms did not improve and she went to her primary care doctor.  Patient did not start any of the antibiotics.  Primary doctor sent her to the hospital for IV antibiotics.  Patient was admitted on that day.  She was started on IV clindamycin.  Patient was instructed to continue the clindamycin.  She was also instructed to continue colchicine.  Patient has not noticed any improvement but has not taken any of her medications since discharge on the 13th. Past Medical History:  Diagnosis Date   AAA (abdominal aortic aneurysm)    a. s/p stent graft repair 01/2016.   Acute ischemic colitis ([place]) 07/29/2010   Diagnosed June, 2012 characterized by acute lower GI bleeding, spontaneously resolved.    Acute respiratory infection 06/09/2014   Acute sinus infection 05/14/2015   Allergic rhinitis, cause unspecified    Angioedema of lips 06/03/2014   Anxiety state, unspecified    Arthritis of left hip 04/16/2015   Bilateral hearing loss 07/19/2017   Chronic LBP    Coronary artery disease    a. inferior MI 1998 s/p PCI of RCA. b. stenting of Cx 04/2010. c. DES to prox LAD 05/2013. d. DES x 3 in 10/2015. // Myoview 07/2018:  EF 59, normal perfusion, low  risk    Coronary artery disease involving native heart without angina pectoris 07/31/2006   Qualifier: Diagnosis of  By: [name] MD, [name]  ANGIOGRAPHIC DATA:  1. Ventriculography done in the RAO projection reveals a small wall  motion abnormality in the mid inferior wall. Ejection fraction  would be estimated at around 50%.  2. The right coronary artery has some progressive disease of about 60-  80% in the proximal mid segment. The distal vessel appears to be  50-70% narrowing althou   Degeneration of lumbar or lumbosacral intervertebral disc    Depressive disorder, not elsewhere classified    Diabetes ([place]) 11/03/2006   Qualifier: Diagnosis of  By: [name] MD, [name]    Diabetes mellitus    TYPE II   Frequent urination 05/08/2014   Gout 08/22/2013   History of endovascular stent graft for abdominal aortic aneurysm (AAA) 01/22/2016   Hyperlipidemia    Hypertension    Hypothyroidism    Iron deficiency anemia 06/13/2017   Lower GI bleed 06/2010   Diverticular bleed   Lumbar radiculopathy 05/15/2015   MENOPAUSAL DISORDER 10/29/2007   Qualifier: Diagnosis of  By: [name] MD, [name]    Myalgia 09/25/2013   Noncompliance with medications 02/26/2014   Obesity, unspecified    OTITIS MEDIA, SEROUS, CHRONIC 04/23/2007   Qualifier: Diagnosis of  By: [name] MD, Legrand Como  E    Thoracic disc disease 11/19/2018   URTICARIA 09/23/2009   Qualifier: Diagnosis of  By: [name] MD, Mateo Flow A    Venous insufficiency 07/19/2017    Patient Active Problem List   Diagnosis Date Noted   Cellulitis of left hand 10/27/2020   Lymphadenopathy of head and neck 09/17/2020   Primary osteoarthritis of both knees 07/06/2020   Right ear pain 12/28/2019   Generalized weakness 11/16/2019   Cough 10/26/2019   Arthritis of left knee 09/19/2019   Unstable angina (HCC) 04/03/2019   Rash 11/22/2018   Thoracic disc disease 11/19/2018   Lumbar spinal stenosis 11/19/2018   Right hand paresthesia 09/15/2018   Right leg swelling  01/16/2018   Edema 10/25/2017   Venous insufficiency 07/19/2017   B12 deficiency 07/19/2017   Bilateral hearing loss 07/19/2017   Iron deficiency anemia 06/13/2017   Coronary artery disease    Bilateral swelling of feet 05/29/2017   Pharyngitis 01/11/2017   Vaginal yeast infection 07/05/2016   Lower abdominal pain 07/05/2016   Chronic low back pain 06/28/2016   Insomnia 06/28/2016   History of endovascular stent graft for abdominal aortic aneurysm (AAA) 01/22/2016   Lumbar radiculopathy 05/15/2015   Acute sinus infection 05/14/2015   Arthritis of left hip 04/16/2015   Acute gout 01/16/2015   Chronic pain of both knees 07/15/2014   Acute respiratory infection 06/09/2014   Angioedema of lips 06/03/2014   Frequent urination 05/08/2014   Acute epiglottitis without obstruction 05/05/2014   Angioedema    Neck swelling    Abdominal tenderness 03/26/2014   Noncompliance with medications 02/26/2014   AAA (abdominal aortic aneurysm) ([place])    Chest pain 02/24/2014   Jaw pain 09/25/2013   Myalgia 09/25/2013   Gout 08/22/2013   Abdominal pain, unspecified site 08/13/2013   Musculoskeletal pain 08/04/2013   Aftercare following surgery of the circulatory system, NEC 06/25/2013   Peripheral vascular disease, unspecified ([place]) 06/19/2012   Dry cough 03/05/2012   Dysuria 04/21/2011   Low back pain 10/04/2010   Acute ischemic colitis ([place]) 07/29/2010   Lower GI bleed 07/23/2010   Liver lesion 07/23/2010   Encounter for well adult exam with abnormal findings 07/22/2010   URTICARIA 09/23/2009   TOBACCO ABUSE 11/18/2008   Obesity 11/17/2008   MENOPAUSAL DISORDER 10/29/2007   Anxiety state 06/22/2007   DEPRESSION 06/22/2007   Right otitis media 04/23/2007   Constipation 02/07/2007   [place] DISEASE, LUMBOSACRAL SPINE 02/07/2007   Diabetes ([place]) 11/03/2006   Allergic rhinitis 11/03/2006   Hypothyroidism 07/31/2006   Hyperlipidemia 07/31/2006   Essential hypertension 07/31/2006    Past  Surgical History:  Procedure Laterality Date   CARDIAC CATHETERIZATION     PCI OF BOTH THE CIRCUMFLEX AND LEFT ANTERIOR DESCENDING ARTERY   CARDIAC CATHETERIZATION N/A 10/29/2015   Procedure: Coronary Stent Intervention;  Surgeon: Nelva Bush, MD;  Location: [place] [facility];  Service: Cardiovascular;  Laterality: N/A;   CARDIAC CATHETERIZATION N/A 10/29/2015   Procedure: Coronary/Graft Angiography;  Surgeon: Nelva Bush, MD;  Location: [place] [facility];  Service: Cardiovascular;  Laterality: N/A;   CARDIAC CATHETERIZATION N/A 10/29/2015   Procedure: Intravascular Pressure Wire/FFR Study;  Surgeon: Nelva Bush, MD;  Location: [place] [facility];  Service: Cardiovascular;  Laterality: N/A;   CESAREAN SECTION     ENDOVASCULAR STENT INSERTION N/A 01/22/2016   Procedure: ABDOMINAL AORTIC ENDOVASCULAR STENT GRAFT INSERTION;  Surgeon: Rosetta Posner, MD;  Location: [place];  Service: Vascular;  Laterality: N/A;   HEART  STENT  [id number]  and  Jun 07, 2013   X 3   LEFT HEART CATH AND CORONARY ANGIOGRAPHY N/A 04/03/2019   Procedure: LEFT HEART CATH AND CORONARY ANGIOGRAPHY;  Surgeon: Nelva Bush, MD;  Location: [place] [facility];  Service: Cardiovascular;  Laterality: N/A;   LEFT HEART CATHETERIZATION WITH CORONARY ANGIOGRAM N/A 06/07/2013   Procedure: LEFT HEART CATHETERIZATION WITH CORONARY ANGIOGRAM;  Surgeon: [name], MD;  Location[facility];  Service: Cardiovascular;  Laterality: N/A;   LEFT HEART CATHETERIZATION WITH CORONARY ANGIOGRAM N/A 02/25/2014   Procedure: LEFT HEART CATHETERIZATION WITH CORONARY ANGIOGRAM;  Surgeon: Troy Sine, MD;  Location: [facility] CATH LAB;  Service: Cardiovascular;  Laterality: N/A;   LUMBAR FUSION  01/2007   DR. Patrice Paradise...3-LEVEL WITH FIXATION   OOPHORECTOMY     BSO? pt.unsure   PARATHYROIDECTOMY     SPINE SURGERY     THYROIDECTOMY     TOTAL ABDOMINAL HYSTERECTOMY       OB History     Gravida  3   Para  2   Term      Preterm       AB  1   Living  3      SAB  1   IAB      Ectopic      Multiple  1   Live Births              Family History  Problem Relation Age of Onset   Heart attack Mother 30       s/p D&C-CARDIAC ARREST 1966   Heart disease Mother    Heart attack Father 23       74 WITH MI   Heart disease Father    Diabetes Brother    Diabetes Maternal Aunt    Arthritis Maternal Aunt    Breast cancer Maternal Aunt        Post menopausal   Colon cancer Neg Hx     Social History   Tobacco Use   Smoking status: Former    Years: 30.00    Types: Cigarettes    Quit date: 05/31/1986    Years since quitting: 34.4   Smokeless tobacco: Never  Vaping Use   Vaping Use: Never used  Substance Use Topics   Alcohol use: No   Drug use: No    Home Medications Prior to Admission medications   Medication Sig Start Date End Date Taking? Authorizing Provider  oxyCODONE (ROXICODONE) 5 MG immediate release tablet Take 1 tablet (5 mg total) by mouth every 6 (six) hours as needed for severe pain. 10/30/20  Yes Dorie Rank, MD  allopurinol (ZYLOPRIM) 300 MG tablet Take 1 tablet (300 mg total) by mouth as needed (for Gout). 10/26/20   Biagio Borg, MD  aspirin 81 MG EC tablet Take 81 mg by mouth daily.     [provider]  atenolol (TENORMIN) 25 MG tablet Take 1 tablet (25 mg total) by mouth daily. TAKE 1 TABLET TWICE DAILY Patient taking differently: Take 25 mg by mouth daily. 08/20/20   Biagio Borg, MD  atorvastatin (LIPITOR) 80 MG tablet Take 1 tablet (80 mg total) by mouth daily. 11/02/20   Mariel Aloe, MD  celecoxib (CELEBREX) 200 MG capsule Take 1 capsule (200 mg total) by mouth 2 (two) times daily as needed for moderate pain. 09/19/19   Biagio Borg, MD  Cholecalciferol (THERA-D 2000) 50 MCG (2000 UT) TABS 1 tab by mouth once  daily Patient taking differently: Take 1 tablet by mouth daily at 12 noon. 03/23/20   Biagio Borg, MD  clindamycin (CLEOCIN) 300 MG capsule Take 1 capsule (300  mg total) by mouth 3 (three) times daily for 6 days. 10/29/20 11/04/20  Mariel Aloe, MD  colchicine 0.6 MG tablet Take 1 tablet (0.6 mg total) by mouth daily for 3 days. 10/30/20 11/02/20  Mariel Aloe, MD  docusate sodium (COLACE) 100 MG capsule Take 1 capsule (100 mg total) by mouth 2 (two) times daily as needed for mild constipation. Patient not taking: No sig reported 03/04/19   Joseph Pierini, MD  ezetimibe (ZETIA) 10 MG tablet Take 1 tablet (10 mg total) by mouth daily. Patient not taking: No sig reported 09/13/19   Richardson Dopp T, PA-C  isosorbide mononitrate (IMDUR) 30 MG 24 hr tablet Take 0.5 tablets (15 mg total) by mouth daily. 07/17/19 12/26/20  Richardson Dopp T, PA-C  nitroGLYCERIN (NITROSTAT) 0.4 MG SL tablet Place 1 tablet (0.4 mg total) under the tongue every 5 (five) minutes as needed for chest pain. 12/31/18   Richardson Dopp T, PA-C  oxyCODONE-acetaminophen (PERCOCET/ROXICET) 5-325 MG tablet Take 1 tablet by mouth every 6 (six) hours as needed for up to 5 days for severe pain. 10/29/20 11/03/20  Mariel Aloe, MD  pantoprazole (PROTONIX) 40 MG tablet Take 40 mg by mouth daily as needed (heartburn acid reflux).    [provider]  pregabalin (LYRICA) 75 MG capsule Take 75 mg by mouth daily as needed (pain).  11/20/18   [provider]  SYNTHROID 150 MCG tablet TAKE 1 TABLET(150 MCG) BY MOUTH DAILY Patient taking differently: Take 150 mcg by mouth daily. 08/04/20   Biagio Borg, MD  tiZANidine (ZANAFLEX) 2 MG tablet Take 1 tablet (2 mg total) by mouth every 6 (six) hours as needed for muscle spasms. 03/13/18   Biagio Borg, MD  triamcinolone (NASACORT) 55 MCG/ACT AERO nasal inhaler Place 2 sprays into the nose as needed (congestion). 09/19/19   Biagio Borg, MD    Allergies    Crestor [rosuvastatin calcium], Prilosec [omeprazole], Miconazole nitrate, Augmentin [amoxicillin-pot clavulanate], and Doxycycline  Review of Systems   Review of Systems  All other  systems reviewed and are negative.  Physical Exam Updated Vital Signs BP 111/87   Pulse 65   Resp 18   SpO2 100%   Physical Exam Vitals and nursing note reviewed.  Constitutional:      General: She is not in acute distress.    Appearance: She is well-developed.  HENT:     Head: Normocephalic and atraumatic.     Right Ear: External ear normal.     Left Ear: External ear normal.  Eyes:     General: No scleral icterus.       Right eye: No discharge.        Left eye: No discharge.     Conjunctiva/sclera: Conjunctivae normal.  Neck:     Trachea: No tracheal deviation.  Cardiovascular:     Rate and Rhythm: Normal rate and regular rhythm.  Pulmonary:     Effort: Pulmonary effort is normal. No respiratory distress.     Breath sounds: No stridor.  Abdominal:     General: There is no distension.  Musculoskeletal:        General: Swelling and tenderness present. No deformity.     Cervical back: Neck supple.     Comments: Erythema and tenderness of the left hand, no  lymphangitic streaking up the forearm, no edema or tenderness of the forearm or upper arm  Skin:    General: Skin is warm and dry.     Findings: No rash.  Neurological:     Mental Status: She is alert.     Cranial Nerves: Cranial nerve deficit: no gross deficits.    ED Results / Procedures / Treatments   Labs (all labs ordered are listed, but only abnormal results are displayed) Labs Reviewed  BASIC METABOLIC PANEL - Abnormal; Notable for the following components:      Result Value   Glucose, Bld 125 (*)    Calcium 8.6 (*)    GFR, Estimated 59 (*)    All other components within normal limits  CBC WITH DIFFERENTIAL/PLATELET    EKG None  Radiology No results found.  Procedures Procedures   Medications Ordered in ED Medications  acetaminophen (TYLENOL) tablet 650 mg (has no administration in time range)  oxyCODONE-acetaminophen (PERCOCET/ROXICET) 5-325 MG per tablet 2 tablet (has no administration in  time range)    ED Course  I have reviewed the triage vital signs and the nursing notes.  Pertinent labs & imaging results that were available during my care of the patient were reviewed by me and considered in my medical decision making (see chart for details).  Clinical Course as of 10/30/20 0844  Fri Oct 30, 2020  0817 Labs reviewed.  CBC and metabolic panel unremarkable [JK]    Clinical Course User Index [JK] Dorie Rank, MD   MDM Rules/Calculators/A&P                           Patient has persistent swelling and tenderness of her left hand.  Patient was recently in the hospital and treated for possible cellulitis.  She was also felt to possibly have a component of gouty arthritis.  Patient does have x-rays that show arthritis as well.  Patient is afebrile here.  She does not have any lymphangitic streaking.  She was discharged from the hospital yesterday and return later in the evening.  She unfortunately has waited in the emergency room overnight.  Patient has not had any fever.  I doubt that she is having worsening cellulitis.  I suspect this may be more related to the gout.  Patient disagrees and does not feel that is the source of her pain.  I do not think at this time she has septic arthritis or requires admission to the hospital for continued IV antibiotics.  I think he might benefit from outpatient referral to an orthopedic hand specialist. Final Clinical Impression(s) / ED Diagnoses Final diagnoses:  Cellulitis, unspecified cellulitis site  Gout, unspecified cause, unspecified chronicity, unspecified site    Rx / DC Orders ED Discharge Orders          Ordered    oxyCODONE (ROXICODONE) 5 MG immediate release tablet  Every 6 hours PRN        10/30/20 0819             Dorie Rank, MD 10/30/20 0845

## 2020-10-30 NOTE — Discharge Instructions (Addendum)
Please take the antibiotic and colchicine prescriptions you were prescribed upon discharge from the hospital.  Follow-up with the orthopedic hand specialist for second opinion and further evaluation

## 2020-10-30 NOTE — Assessment & Plan Note (Signed)
BP Readings from Last 3 Encounters:  10/30/20 126/77  10/29/20 95/62  10/27/20 118/70   Stable, pt to continue medical treatment tenormin

## 2020-10-30 NOTE — Assessment & Plan Note (Signed)
Lab Results  Component Value Date   HGBA1C 6.1 (H) 10/28/2020   Stable, pt to continue current medical treatment  - diet

## 2020-10-31 LAB — LIPID PANEL
Chol/HDL Ratio: 5.7 ratio — ABNORMAL HIGH (ref 0.0–4.4)
Cholesterol, Total: 143 mg/dL (ref 100–199)
HDL: 25 mg/dL — ABNORMAL LOW (ref >39)
LDL Chol Calc (NIH): 94 mg/dL (ref 0–99)
Triglycerides: 130 mg/dL (ref 0–149)
VLDL Cholesterol Cal: 24 mg/dL (ref 5–40)

## 2020-10-31 LAB — HEPATIC FUNCTION PANEL
ALT: 19 IU/L (ref 0–32)
AST: 26 IU/L (ref 0–40)
Albumin: 3.7 g/dL (ref 3.7–4.7)
Alkaline Phosphatase: 101 IU/L (ref 44–121)
Bilirubin Total: 0.6 mg/dL (ref 0.0–1.2)
Bilirubin, Direct: 0.27 mg/dL (ref 0.00–0.40)
Total Protein: 7.7 g/dL (ref 6.0–8.5)

## 2020-11-03 ENCOUNTER — Encounter: Payer: Self-pay | Admitting: Internal Medicine

## 2020-11-03 ENCOUNTER — Ambulatory Visit (INDEPENDENT_AMBULATORY_CARE_PROVIDER_SITE_OTHER): Payer: Medicare HMO | Admitting: Internal Medicine

## 2020-11-03 ENCOUNTER — Other Ambulatory Visit: Payer: Self-pay

## 2020-11-03 VITALS — BP 100/66 | HR 80 | Temp 99.4°F | Ht 66.0 in | Wt 201.0 lb

## 2020-11-03 DIAGNOSIS — L03114 Cellulitis of left upper limb: Secondary | ICD-10-CM | POA: Diagnosis not present

## 2020-11-03 DIAGNOSIS — E1165 Type 2 diabetes mellitus with hyperglycemia: Secondary | ICD-10-CM | POA: Diagnosis not present

## 2020-11-03 DIAGNOSIS — I10 Essential (primary) hypertension: Secondary | ICD-10-CM | POA: Diagnosis not present

## 2020-11-03 DIAGNOSIS — M109 Gout, unspecified: Secondary | ICD-10-CM | POA: Diagnosis not present

## 2020-11-03 MED ORDER — COLCHICINE 0.6 MG PO TABS: 0.6000 mg | ORAL_TABLET | Freq: Every day | ORAL | 3 refills | Status: DC

## 2020-11-03 MED ORDER — KETOROLAC TROMETHAMINE 30 MG/ML IJ SOLN
30.0000 mg | Freq: Once | INTRAMUSCULAR | Status: AC
  Administered 2020-11-03: 30 mg via INTRAMUSCULAR

## 2020-11-03 NOTE — Progress Notes (Signed)
Patient ID: [name], female   DOB: May 23, 1945, 75 y.o.   MRN: [id number]        Chief Complaint: follow up post hospn left hand       HPI:  [name] is a 75 y.o. female here overall doing ok with left hand pain, sweling near resolved with cleocin and steroid tx, unclear if cellulitis vs gout - pt adamant this was not gout.  Pt denies chest pain, increased sob or doe, wheezing, orthopnea, PND, increased LE swelling, palpitations, dizziness or syncope.   Pt denies polydipsia, polyuria, or new focal neuro s/s.   Pt denies fever, wt loss, night sweats, loss of appetite, or other constitutional symptoms          Wt Readings from Last 3 Encounters:  11/03/20 201 lb (91.2 kg)  10/28/20 201 lb 4.5 oz (91.3 kg)  10/27/20 201 lb (91.2 kg)   BP Readings from Last 3 Encounters:  11/03/20 100/66  10/30/20 126/77  10/29/20 95/62         Past Medical History:  Diagnosis Date   AAA (abdominal aortic aneurysm)    a. s/p stent graft repair 01/2016.   Acute ischemic colitis ([place]) 07/29/2010   Diagnosed June, 2012 characterized by acute lower GI bleeding, spontaneously resolved.    Acute respiratory infection 06/09/2014   Acute sinus infection 05/14/2015   Allergic rhinitis, cause unspecified    Angioedema of lips 06/03/2014   Anxiety state, unspecified    Arthritis of left hip 04/16/2015   Bilateral hearing loss 07/19/2017   Chronic LBP    Coronary artery disease    a. inferior MI 1998 s/p PCI of RCA. b. stenting of Cx 04/2010. c. DES to prox LAD 05/2013. d. DES x 3 in 10/2015. // Myoview 07/2018:  EF 59, normal perfusion, low risk    Coronary artery disease involving native heart without angina pectoris 07/31/2006   Qualifier: Diagnosis of  By: [name] MD, [name]  ANGIOGRAPHIC DATA:  1. Ventriculography done in the RAO projection reveals a small wall  motion abnormality in the mid inferior wall. Ejection fraction  would be estimated at around 50%.  2. The right coronary artery has some progressive  disease of about 60-  80% in the proximal mid segment. The distal vessel appears to be  50-70% narrowing althou   Degeneration of lumbar or lumbosacral intervertebral disc    Depressive disorder, not elsewhere classified    Diabetes ([place]) 11/03/2006   Qualifier: Diagnosis of  By: [name] MD, [name]    Diabetes mellitus    TYPE II   Frequent urination 05/08/2014   Gout 08/22/2013   History of endovascular stent graft for abdominal aortic aneurysm (AAA) 01/22/2016   Hyperlipidemia    Hypertension    Hypothyroidism    Iron deficiency anemia 06/13/2017   Lower GI bleed 06/2010   Diverticular bleed   Lumbar radiculopathy 05/15/2015   MENOPAUSAL DISORDER 10/29/2007   Qualifier: Diagnosis of  By: [name] MD, [name]    Myalgia 09/25/2013   Noncompliance with medications 02/26/2014   Obesity, unspecified    OTITIS MEDIA, SEROUS, CHRONIC 04/23/2007   Qualifier: Diagnosis of  By: [name] MD, [name]    Thoracic disc disease 11/19/2018   URTICARIA 09/23/2009   Qualifier: Diagnosis of  By: [name] MD, Mateo Flow A    Venous insufficiency 07/19/2017   Past Surgical History:  Procedure Laterality Date   CARDIAC CATHETERIZATION     PCI OF BOTH THE  CIRCUMFLEX AND LEFT ANTERIOR DESCENDING ARTERY   CARDIAC CATHETERIZATION N/A 10/29/2015   Procedure: Coronary Stent Intervention;  Surgeon: Nelva Bush, MD;  Location: [place] [facility];  Service: Cardiovascular;  Laterality: N/A;   CARDIAC CATHETERIZATION N/A 10/29/2015   Procedure: Coronary/Graft Angiography;  Surgeon: Nelva Bush, MD;  Location: [place] [facility];  Service: Cardiovascular;  Laterality: N/A;   CARDIAC CATHETERIZATION N/A 10/29/2015   Procedure: Intravascular Pressure Wire/FFR Study;  Surgeon: Nelva Bush, MD;  Location: [place] [facility];  Service: Cardiovascular;  Laterality: N/A;   CESAREAN SECTION     ENDOVASCULAR STENT INSERTION N/A 01/22/2016   Procedure: ABDOMINAL AORTIC ENDOVASCULAR STENT GRAFT INSERTION;  Surgeon: Rosetta Posner, MD;   Location: [facility];  Service: Vascular;  Laterality: N/A;   HEART STENT  04-2010  and  Jun 07, 2013   X 3   LEFT HEART CATH AND CORONARY ANGIOGRAPHY N/A 04/03/2019   Procedure: LEFT HEART CATH AND CORONARY ANGIOGRAPHY;  Surgeon: Nelva Bush, MD;  Location: [place] [facility];  Service: Cardiovascular;  Laterality: N/A;   LEFT HEART CATHETERIZATION WITH CORONARY ANGIOGRAM N/A 06/07/2013   Procedure: LEFT HEART CATHETERIZATION WITH CORONARY ANGIOGRAM;  Surgeon: [name], MD;  Location[facility];  Service: Cardiovascular;  Laterality: N/A;   LEFT HEART CATHETERIZATION WITH CORONARY ANGIOGRAM N/A 02/25/2014   Procedure: LEFT HEART CATHETERIZATION WITH CORONARY ANGIOGRAM;  Surgeon: Troy Sine, MD;  Location: [facility] C[facility];  Service: Cardiovascular;  Laterality: N/A;   LUMBAR FUSION  01/2007   DR. Patrice Paradise...3-LEVEL WITH FIXATION   OOPHORECTOMY     BSO? pt.unsure   PARATHYROIDECTOMY     SPINE SURGERY     THYROIDECTOMY     TOTAL ABDOMINAL HYSTERECTOMY      reports that she quit smoking about 34 years ago. Her smoking use included cigarettes. She has never used smokeless tobacco. She reports that she does not drink alcohol and does not use drugs. family history includes Arthritis in her maternal aunt; Breast cancer in her maternal aunt; Diabetes in her brother and maternal aunt; Heart attack (age of onset: 79) in her mother; Heart attack (age of onset: 16) in her father; Heart disease in her father and mother. Allergies  Allergen Reactions   Crestor [Rosuvastatin Calcium] Other (See Comments)    Feeling poor   Prilosec [Omeprazole] Other (See Comments)    Chest pain   Miconazole Nitrate Hives    REACTION: hives   Augmentin [Amoxicillin-Pot Clavulanate] Hives, Itching and Rash    Has patient had a PCN reaction causing immediate rash, facial/tongue/throat swelling, SOB or lightheadedness with hypotension:unsure Has patient had a PCN reaction causing severe rash involving mucus  membranes or skin necrosis:unsure Has patient had a PCN reaction that required hospitalization:No Has patient had a PCN reaction occurring within the last 10 years:NO If all of the above answers are "NO", then may proceed with Cephalosporin use. Has patient had a PCN reaction causing immediate rash, facial/tongue/throat swelling, SOB or lightheadedness with hypotension:unsure Has patient had a PCN reaction causing severe rash involving mucus membranes or skin necrosis:unsure Has patient had a PCN reaction that required hospitalization:No Has patient had a PCN reaction occurring within the last 10 years:NO If all of the above answers are "NO", then may proceed with Cephalosporin use.    Doxycycline Other (See Comments)    REACTION: gi upset   Current Outpatient Medications on File Prior to Visit  Medication Sig Dispense Refill   allopurinol (ZYLOPRIM) 300 MG  tablet Take 1 tablet (300 mg total) by mouth as needed (for Gout). 90 tablet 3   aspirin 81 MG EC tablet Take 81 mg by mouth daily.      atenolol (TENORMIN) 25 MG tablet Take 1 tablet (25 mg total) by mouth daily. TAKE 1 TABLET TWICE DAILY (Patient taking differently: Take 25 mg by mouth daily.) 180 tablet 0   atorvastatin (LIPITOR) 80 MG tablet Take 1 tablet (80 mg total) by mouth daily.  1   celecoxib (CELEBREX) 200 MG capsule Take 1 capsule (200 mg total) by mouth 2 (two) times daily as needed for moderate pain. 180 capsule 1   Cholecalciferol (THERA-D 2000) 50 MCG (2000 UT) TABS 1 tab by mouth once daily (Patient taking differently: Take 1 tablet by mouth daily at 12 noon.) 90 tablet 99   isosorbide mononitrate (IMDUR) 30 MG 24 hr tablet Take 0.5 tablets (15 mg total) by mouth daily. 45 tablet 3   nitroGLYCERIN (NITROSTAT) 0.4 MG SL tablet Place 1 tablet (0.4 mg total) under the tongue every 5 (five) minutes as needed for chest pain. 25 tablet 3   oxyCODONE (ROXICODONE) 5 MG immediate release tablet Take 1 tablet (5 mg total) by mouth  every 6 (six) hours as needed for severe pain. 12 tablet 0   pantoprazole (PROTONIX) 40 MG tablet Take 40 mg by mouth daily as needed (heartburn acid reflux).     pregabalin (LYRICA) 75 MG capsule Take 75 mg by mouth daily as needed (pain).      SYNTHROID 150 MCG tablet TAKE 1 TABLET(150 MCG) BY MOUTH DAILY (Patient taking differently: Take 150 mcg by mouth daily.) 90 tablet 0   tiZANidine (ZANAFLEX) 2 MG tablet Take 1 tablet (2 mg total) by mouth every 6 (six) hours as needed for muscle spasms. 40 tablet 1   triamcinolone (NASACORT) 55 MCG/ACT AERO nasal inhaler Place 2 sprays into the nose as needed (congestion). 1 each 12   docusate sodium (COLACE) 100 MG capsule Take 1 capsule (100 mg total) by mouth 2 (two) times daily as needed for mild constipation. (Patient not taking: No sig reported) 30 capsule 1   ezetimibe (ZETIA) 10 MG tablet Take 1 tablet (10 mg total) by mouth daily. (Patient not taking: No sig reported) 90 tablet 1   No current facility-administered medications on file prior to visit.        ROS:  All others reviewed and negative.  Objective        PE:  BP 100/66 (BP Location: Right Arm, Patient Position: Sitting, Cuff Size: Large)   Pulse 80   Temp 99.4 F (37.4 C)   Ht 5\' 6"  (1.676 m)   Wt 201 lb (91.2 kg)   SpO2 97%   BMI 32.44 kg/m                 Constitutional: Pt appears in NAD               HENT: Head: NCAT.                Right Ear: External ear normal.                 Left Ear: External ear normal.                Eyes: . Pupils are equal, round, and reactive to light. Conjunctivae and EOM are normal               Nose: without  d/c or deformity               Neck: Neck supple. Gross normal ROM               Cardiovascular: Normal rate and regular rhythm.                 Pulmonary/Chest: Effort normal and breath sounds without rales or wheezing.                Abd:  Soft, NT, ND, + BS, no organomegaly               Neurological: Pt is alert. At baseline  orientation, motor grossly intact               Skin: Skin is warm. No rashes, no other new lesions, LE edema - none               Left hand with trace generalized swelling, no redness               Psychiatric: Pt behavior is normal without agitation   Micro: none  Cardiac tracings I have personally interpreted today:  none  Pertinent Radiological findings (summarize): none   Lab Results  Component Value Date   WBC 8.0 10/29/2020   HGB 13.3 10/29/2020   HCT 42.6 10/29/2020   PLT 324 10/29/2020   GLUCOSE 125 (H) 10/29/2020   CHOL 143 10/30/2020   TRIG 130 10/30/2020   HDL 25 (L) 10/30/2020   LDLDIRECT 131.0 03/27/2015   LDLCALC 94 10/30/2020   ALT 19 10/30/2020   AST 26 10/30/2020   NA 136 10/29/2020   K 3.7 10/29/2020   CL 98 10/29/2020   CREATININE 0.99 10/29/2020   BUN 9 10/29/2020   CO2 27 10/29/2020   TSH 2.32 03/19/2020   INR 1.08 01/22/2016   HGBA1C 6.1 (H) 10/28/2020   MICROALBUR 0.8 03/19/2020   Assessment/Plan:  [name] is a 75 y.o. Black or African American [2] female with  has a past medical history of AAA (abdominal aortic aneurysm), Acute ischemic colitis ([place]) (07/29/2010), Acute respiratory infection (06/09/2014), Acute sinus infection (05/14/2015), Allergic rhinitis, cause unspecified, Angioedema of lips (06/03/2014), Anxiety state, unspecified, Arthritis of left hip (04/16/2015), Bilateral hearing loss (07/19/2017), Chronic LBP, Coronary artery disease, Coronary artery disease involving native heart without angina pectoris (07/31/2006), Degeneration of lumbar or lumbosacral intervertebral disc, Depressive disorder, not elsewhere classified, Diabetes ([place]) (11/03/2006), Diabetes mellitus, Frequent urination (05/08/2014), Gout (08/22/2013), History of endovascular stent graft for abdominal aortic aneurysm (AAA) (01/22/2016), Hyperlipidemia, Hypertension, Hypothyroidism, Iron deficiency anemia (06/13/2017), Lower GI bleed (06/2010), Lumbar radiculopathy (05/15/2015),  MENOPAUSAL DISORDER (10/29/2007), Myalgia (09/25/2013), Noncompliance with medications (02/26/2014), Obesity, unspecified, OTITIS MEDIA, SEROUS, CHRONIC (04/23/2007), Thoracic disc disease (11/19/2018), URTICARIA (09/23/2009), and Venous insufficiency (07/19/2017).  Cellulitis of left hand Near resolved, ok for toradol 30 mg IM pain, .finish antibbx  Gout Also to cont new colchicine preventive,  to f/u any worsening symptoms or concerns  Essential hypertension BP Readings from Last 3 Encounters:  11/03/20 100/66  10/30/20 126/77  10/29/20 95/62   Stable, pt to continue medical treatment tenormin   Diabetes [facility]) Lab Results  Component Value Date   HGBA1C 6.1 (H) 10/28/2020   Stable, pt to continue current medical treatment  - diet  Followup: Return if symptoms worsen or fail to improve.  [name], MD 11/08/2020 8:46 PM [facility]

## 2020-11-03 NOTE — Patient Instructions (Addendum)
You had the pain shot today (toradol)  Please continue all other medications as before, including the colchicine, and finishing the antibiotic  Please have the pharmacy call with any other refills you may need.  Please continue your efforts at being more active, low cholesterol diet, and weight control.  Please keep your appointments with your specialists as you may have planned  Please make an Appointment to return in 3 months, or sooner if needed

## 2020-11-08 ENCOUNTER — Encounter: Payer: Self-pay | Admitting: Internal Medicine

## 2020-11-08 ENCOUNTER — Other Ambulatory Visit: Payer: Self-pay | Admitting: Internal Medicine

## 2020-11-08 NOTE — Assessment & Plan Note (Signed)
Lab Results  Component Value Date   HGBA1C 6.1 (H) 10/28/2020   Stable, pt to continue current medical treatment  - diet

## 2020-11-08 NOTE — Assessment & Plan Note (Signed)
BP Readings from Last 3 Encounters:  11/03/20 100/66  10/30/20 126/77  10/29/20 95/62   Stable, pt to continue medical treatment tenormin

## 2020-11-08 NOTE — Telephone Encounter (Signed)
Please refill as per office routine med refill policy (all routine meds to be refilled for 3 mo or monthly (per pt preference) up to one year from last visit, then month to month grace period for 3 mo, then further med refills will have to be denied) ? ?

## 2020-11-08 NOTE — Assessment & Plan Note (Signed)
Also to cont new colchicine preventive,  to f/u any worsening symptoms or concerns

## 2020-11-08 NOTE — Assessment & Plan Note (Signed)
Near resolved, ok for toradol 30 mg IM pain, .finish antibbx

## 2020-11-10 ENCOUNTER — Telehealth: Payer: Self-pay | Admitting: Physician Assistant

## 2020-11-10 ENCOUNTER — Other Ambulatory Visit: Payer: Self-pay | Admitting: Internal Medicine

## 2020-11-10 DIAGNOSIS — L03114 Cellulitis of left upper limb: Secondary | ICD-10-CM

## 2020-11-10 DIAGNOSIS — M109 Gout, unspecified: Secondary | ICD-10-CM

## 2020-11-10 DIAGNOSIS — I10 Essential (primary) hypertension: Secondary | ICD-10-CM

## 2020-11-10 DIAGNOSIS — E1165 Type 2 diabetes mellitus with hyperglycemia: Secondary | ICD-10-CM

## 2020-11-10 NOTE — Telephone Encounter (Signed)
Pt is returning call for lab work results

## 2020-11-11 ENCOUNTER — Other Ambulatory Visit: Payer: Self-pay | Admitting: *Deleted

## 2020-11-11 DIAGNOSIS — E782 Mixed hyperlipidemia: Secondary | ICD-10-CM

## 2020-11-17 ENCOUNTER — Telehealth: Payer: Self-pay

## 2020-11-17 NOTE — Telephone Encounter (Signed)
Need to r/s pharmd lipid appt due to short staff from 11/3 to a different day. Will route to scheduling team to follow up.

## 2020-11-18 DIAGNOSIS — M25532 Pain in left wrist: Secondary | ICD-10-CM | POA: Diagnosis not present

## 2020-11-19 ENCOUNTER — Ambulatory Visit: Payer: Medicare HMO

## 2020-11-19 NOTE — Progress Notes (Incomplete)
Patient ID: [name]                 DOB: 1945/07/25                    MRN: [id number]     HPI: [name] is a 75 y.o. female patient of Dr. Burt Knack referred to lipid clinic by Richardson Dopp, PA. PMH is significant for s/p prior inf MI in 1998 tx with stent to the RCA, S/p stent LCx in 2012, S/p stent to the LAD in 2015, S/p DES x 3 to the RCA 2017, Myoview 07/2018: low risk, Cath 03/2019: dRCA diff dz'd supplying small/dz'd PDA+PL br (not ideal for PCI) >> med Rx, DM, HTN, HLD and AAA.  Refuses shots? Nexlizet? Taking atorvastatin and zetia?  Current Medications: atorvastatin 80mg  daily, ezetimibe 10mg  daily Intolerances:  Risk Factors: progressive ASCVD, DM LDL goal: <55  Diet:   Exercise:   Family History:   Social History:   Labs: 10/30/20 TC 143, TG 130, HDL 25, LDL 94 non HDL 118 (atorvastatin 80mg  daily, ezetimibe 10mg  daily)  Past Medical History:  Diagnosis Date   AAA (abdominal aortic aneurysm)    a. s/p stent graft repair 01/2016.   Acute ischemic colitis ([place]) 07/29/2010   Diagnosed June, 2012 characterized by acute lower GI bleeding, spontaneously resolved.    Acute respiratory infection 06/09/2014   Acute sinus infection 05/14/2015   Allergic rhinitis, cause unspecified    Angioedema of lips 06/03/2014   Anxiety state, unspecified    Arthritis of left hip 04/16/2015   Bilateral hearing loss 07/19/2017   Chronic LBP    Coronary artery disease    a. inferior MI 1998 s/p PCI of RCA. b. stenting of Cx 04/2010. c. DES to prox LAD 05/2013. d. DES x 3 in 10/2015. // Myoview 07/2018:  EF 59, normal perfusion, low risk    Coronary artery disease involving native heart without angina pectoris 07/31/2006   Qualifier: Diagnosis of  By: [name] MD, [name]  ANGIOGRAPHIC DATA:  1. Ventriculography done in the RAO projection reveals a small wall  motion abnormality in the mid inferior wall. Ejection fraction  would be estimated at around 50%.  2. The right coronary artery has some  progressive disease of about 60-  80% in the proximal mid segment. The distal vessel appears to be  50-70% narrowing althou   Degeneration of lumbar or lumbosacral intervertebral disc    Depressive disorder, not elsewhere classified    Diabetes ([place]) 11/03/2006   Qualifier: Diagnosis of  By: [name] MD, [name]    Diabetes mellitus    TYPE II   Frequent urination 05/08/2014   Gout 08/22/2013   History of endovascular stent graft for abdominal aortic aneurysm (AAA) 01/22/2016   Hyperlipidemia    Hypertension    Hypothyroidism    Iron deficiency anemia 06/13/2017   Lower GI bleed 06/2010   Diverticular bleed   Lumbar radiculopathy 05/15/2015   MENOPAUSAL DISORDER 10/29/2007   Qualifier: Diagnosis of  By: [name] MD, [name]    Myalgia 09/25/2013   Noncompliance with medications 02/26/2014   Obesity, unspecified    OTITIS MEDIA, SEROUS, CHRONIC 04/23/2007   Qualifier: Diagnosis of  By: [name] MD, [name]    Thoracic disc disease 11/19/2018   URTICARIA 09/23/2009   Qualifier: Diagnosis of  By: [name] MD, Mateo Flow A    Venous insufficiency 07/19/2017    Current Outpatient Medications on File Prior to  Visit  Medication Sig Dispense Refill   allopurinol (ZYLOPRIM) 300 MG tablet Take 1 tablet (300 mg total) by mouth as needed (for Gout). 90 tablet 3   aspirin 81 MG EC tablet Take 81 mg by mouth daily.      atenolol (TENORMIN) 25 MG tablet Take 1 tablet (25 mg total) by mouth daily. TAKE 1 TABLET TWICE DAILY (Patient taking differently: Take 25 mg by mouth daily.) 180 tablet 0   atorvastatin (LIPITOR) 80 MG tablet Take 1 tablet (80 mg total) by mouth daily.  1   celecoxib (CELEBREX) 200 MG capsule Take 1 capsule (200 mg total) by mouth 2 (two) times daily as needed for moderate pain. 180 capsule 1   Cholecalciferol (THERA-D 2000) 50 MCG (2000 UT) TABS 1 tab by mouth once daily (Patient taking differently: Take 1 tablet by mouth daily at 12 noon.) 90 tablet 99   colchicine 0.6 MG tablet Take 1 tablet (0.6 mg  total) by mouth daily for 3 days. 90 tablet 3   docusate sodium (COLACE) 100 MG capsule Take 1 capsule (100 mg total) by mouth 2 (two) times daily as needed for mild constipation. (Patient not taking: No sig reported) 30 capsule 1   ezetimibe (ZETIA) 10 MG tablet Take 1 tablet (10 mg total) by mouth daily. (Patient not taking: No sig reported) 90 tablet 1   isosorbide mononitrate (IMDUR) 30 MG 24 hr tablet Take 0.5 tablets (15 mg total) by mouth daily. 45 tablet 3   nitroGLYCERIN (NITROSTAT) 0.4 MG SL tablet Place 1 tablet (0.4 mg total) under the tongue every 5 (five) minutes as needed for chest pain. 25 tablet 3   oxyCODONE (ROXICODONE) 5 MG immediate release tablet Take 1 tablet (5 mg total) by mouth every 6 (six) hours as needed for severe pain. 12 tablet 0   pantoprazole (PROTONIX) 40 MG tablet Take 40 mg by mouth daily as needed (heartburn acid reflux).     pregabalin (LYRICA) 75 MG capsule Take 75 mg by mouth daily as needed (pain).      SYNTHROID 150 MCG tablet TAKE 1 TABLET(150 MCG) BY MOUTH DAILY 90 tablet 0   tiZANidine (ZANAFLEX) 2 MG tablet Take 1 tablet (2 mg total) by mouth every 6 (six) hours as needed for muscle spasms. 40 tablet 1   triamcinolone (NASACORT) 55 MCG/ACT AERO nasal inhaler Place 2 sprays into the nose as needed (congestion). 1 each 12   No current facility-administered medications on file prior to visit.    Allergies  Allergen Reactions   Crestor [Rosuvastatin Calcium] Other (See Comments)    Feeling poor   Prilosec [Omeprazole] Other (See Comments)    Chest pain   Miconazole Nitrate Hives    REACTION: hives   Augmentin [Amoxicillin-Pot Clavulanate] Hives, Itching and Rash    Has patient had a PCN reaction causing immediate rash, facial/tongue/throat swelling, SOB or lightheadedness with hypotension:unsure Has patient had a PCN reaction causing severe rash involving mucus membranes or skin necrosis:unsure Has patient had a PCN reaction that required  hospitalization:No Has patient had a PCN reaction occurring within the last 10 years:NO If all of the above answers are "NO", then may proceed with Cephalosporin use. Has patient had a PCN reaction causing immediate rash, facial/tongue/throat swelling, SOB or lightheadedness with hypotension:unsure Has patient had a PCN reaction causing severe rash involving mucus membranes or skin necrosis:unsure Has patient had a PCN reaction that required hospitalization:No Has patient had a PCN reaction occurring within the last 10  years:NO If all of the above answers are "NO", then may proceed with Cephalosporin use.    Doxycycline Other (See Comments)    REACTION: gi upset    Assessment/Plan:  1. Hyperlipidemia -    Thank you,   Ramond Dial, Pharm.D, BCPS, CPP [place]  [address], [place], [place] [address]  Phone: [phone]; Fax: [phone]

## 2020-11-24 ENCOUNTER — Ambulatory Visit: Payer: Medicare HMO | Admitting: Pharmacist

## 2020-11-24 NOTE — Progress Notes (Deleted)
Patient ID: [name]                 DOB: [birth year]                    MRN: [id number]     HPI: [name] is a 75 y.o. female patient of Dr Burt Knack referred to lipid clinic by Richardson Dopp, PA. PMH is significant for CAD s/p MI in 1998 tx with stent to RCA, stent to LCx in 2012, stent to LAD in 2015, stent to RCA in 2017, DM, HTN, HLD, AAA s/p EVAR, and obesity. Cardiac cath 03/2019 showed diffuse distal RCA disease leading into a severely diseased small PDA, medical therapy recommended.   Confirm adherence to current meds Issue with rosuva? Doesn't want injection Nexlizet is tier 3 $45/mo or $125/3, so is repatha  Current Medications: atorvastatin 80mg  daily, ezetimibe 10mg  daily Intolerances: rosuvastatin Risk Factors: progressive ASCVD with stents placed on 4 separate occasions, DM, HTN, obesity LDL goal: 55mg /dL  Diet:   Exercise:   Family History: The patient's family history includes Arthritis in her maternal aunt; Breast cancer in her maternal aunt; Diabetes in her brother and maternal aunt; Heart attack (age of onset: 13) in her mother; Heart attack (age of onset: 78) in her father; Heart disease in her father and mother. There is no history of Colon cancer.  Social History: Former tobacco use for 30 years, quit in 1988. Denies drug and alcohol use.  Labs: 10/30/20: TC 143, TG 130, HDL 25, LDL 94 (atorvastatin 80mg  daily, ezetimibe 10mg  daily)  Past Medical History:  Diagnosis Date   AAA (abdominal aortic aneurysm)    a. s/p stent graft repair 01/2016.   Acute ischemic colitis ([place]) 07/29/2010   Diagnosed June, 2012 characterized by acute lower GI bleeding, spontaneously resolved.    Acute respiratory infection 06/09/2014   Acute sinus infection 05/14/2015   Allergic rhinitis, cause unspecified    Angioedema of lips 06/03/2014   Anxiety state, unspecified    Arthritis of left hip 04/16/2015   Bilateral hearing loss 07/19/2017   Chronic LBP    Coronary artery disease     a. inferior MI 1998 s/p PCI of RCA. b. stenting of Cx 04/2010. c. DES to prox LAD 05/2013. d. DES x 3 in 10/2015. // Myoview 07/2018:  EF 59, normal perfusion, low risk    Coronary artery disease involving native heart without angina pectoris 07/31/2006   Qualifier: Diagnosis of  By: [name] MD, [name]  ANGIOGRAPHIC DATA:  1. Ventriculography done in the RAO projection reveals a small wall  motion abnormality in the mid inferior wall. Ejection fraction  would be estimated at around 50%.  2. The right coronary artery has some progressive disease of about 60-  80% in the proximal mid segment. The distal vessel appears to be  50-70% narrowing althou   Degeneration of lumbar or lumbosacral intervertebral disc    Depressive disorder, not elsewhere classified    Diabetes ([place]) 11/03/2006   Qualifier: Diagnosis of  By: [name] MD, [name]    Diabetes mellitus    TYPE II   Frequent urination 05/08/2014   Gout 08/22/2013   History of endovascular stent graft for abdominal aortic aneurysm (AAA) 01/22/2016   Hyperlipidemia    Hypertension    Hypothyroidism    Iron deficiency anemia 06/13/2017   Lower GI bleed 06/2010   Diverticular bleed   Lumbar radiculopathy 05/15/2015   MENOPAUSAL DISORDER 10/29/2007  Qualifier: Diagnosis of  By: [name] MD, [name]    Myalgia 09/25/2013   Noncompliance with medications 02/26/2014   Obesity, unspecified    OTITIS MEDIA, SEROUS, CHRONIC 04/23/2007   Qualifier: Diagnosis of  By: [name] MD, [name]    Thoracic disc disease 11/19/2018   URTICARIA 09/23/2009   Qualifier: Diagnosis of  By: [name] MD, Mateo Flow A    Venous insufficiency 07/19/2017    Current Outpatient Medications on File Prior to Visit  Medication Sig Dispense Refill   allopurinol (ZYLOPRIM) 300 MG tablet Take 1 tablet (300 mg total) by mouth as needed (for Gout). 90 tablet 3   aspirin 81 MG EC tablet Take 81 mg by mouth daily.      atenolol (TENORMIN) 25 MG tablet Take 1 tablet (25 mg total) by mouth daily. TAKE  1 TABLET TWICE DAILY (Patient taking differently: Take 25 mg by mouth daily.) 180 tablet 0   atorvastatin (LIPITOR) 80 MG tablet Take 1 tablet (80 mg total) by mouth daily.  1   celecoxib (CELEBREX) 200 MG capsule Take 1 capsule (200 mg total) by mouth 2 (two) times daily as needed for moderate pain. 180 capsule 1   Cholecalciferol (THERA-D 2000) 50 MCG (2000 UT) TABS 1 tab by mouth once daily (Patient taking differently: Take 1 tablet by mouth daily at 12 noon.) 90 tablet 99   colchicine 0.6 MG tablet Take 1 tablet (0.6 mg total) by mouth daily for 3 days. 90 tablet 3   docusate sodium (COLACE) 100 MG capsule Take 1 capsule (100 mg total) by mouth 2 (two) times daily as needed for mild constipation. (Patient not taking: No sig reported) 30 capsule 1   ezetimibe (ZETIA) 10 MG tablet Take 1 tablet (10 mg total) by mouth daily. (Patient not taking: No sig reported) 90 tablet 1   isosorbide mononitrate (IMDUR) 30 MG 24 hr tablet Take 0.5 tablets (15 mg total) by mouth daily. 45 tablet 3   nitroGLYCERIN (NITROSTAT) 0.4 MG SL tablet Place 1 tablet (0.4 mg total) under the tongue every 5 (five) minutes as needed for chest pain. 25 tablet 3   oxyCODONE (ROXICODONE) 5 MG immediate release tablet Take 1 tablet (5 mg total) by mouth every 6 (six) hours as needed for severe pain. 12 tablet 0   pantoprazole (PROTONIX) 40 MG tablet Take 40 mg by mouth daily as needed (heartburn acid reflux).     pregabalin (LYRICA) 75 MG capsule Take 75 mg by mouth daily as needed (pain).      SYNTHROID 150 MCG tablet TAKE 1 TABLET(150 MCG) BY MOUTH DAILY 90 tablet 0   tiZANidine (ZANAFLEX) 2 MG tablet Take 1 tablet (2 mg total) by mouth every 6 (six) hours as needed for muscle spasms. 40 tablet 1   triamcinolone (NASACORT) 55 MCG/ACT AERO nasal inhaler Place 2 sprays into the nose as needed (congestion). 1 each 12   No current facility-administered medications on file prior to visit.    Allergies  Allergen Reactions    Crestor [Rosuvastatin Calcium] Other (See Comments)    Feeling poor   Prilosec [Omeprazole] Other (See Comments)    Chest pain   Miconazole Nitrate Hives    REACTION: hives   Augmentin [Amoxicillin-Pot Clavulanate] Hives, Itching and Rash    Has patient had a PCN reaction causing immediate rash, facial/tongue/throat swelling, SOB or lightheadedness with hypotension:unsure Has patient had a PCN reaction causing severe rash involving mucus membranes or skin necrosis:unsure Has patient had a PCN  reaction that required hospitalization:No Has patient had a PCN reaction occurring within the last 10 years:NO If all of the above answers are "NO", then may proceed with Cephalosporin use. Has patient had a PCN reaction causing immediate rash, facial/tongue/throat swelling, SOB or lightheadedness with hypotension:unsure Has patient had a PCN reaction causing severe rash involving mucus membranes or skin necrosis:unsure Has patient had a PCN reaction that required hospitalization:No Has patient had a PCN reaction occurring within the last 10 years:NO If all of the above answers are "NO", then may proceed with Cephalosporin use.    Doxycycline Other (See Comments)    REACTION: gi upset    Assessment/Plan:  1. Hyperlipidemia -

## 2020-11-25 DIAGNOSIS — M25532 Pain in left wrist: Secondary | ICD-10-CM | POA: Diagnosis not present

## 2020-12-09 DIAGNOSIS — M25532 Pain in left wrist: Secondary | ICD-10-CM | POA: Diagnosis not present

## 2020-12-15 ENCOUNTER — Encounter: Payer: Self-pay | Admitting: Internal Medicine

## 2020-12-15 ENCOUNTER — Other Ambulatory Visit: Payer: Self-pay

## 2020-12-15 ENCOUNTER — Ambulatory Visit (INDEPENDENT_AMBULATORY_CARE_PROVIDER_SITE_OTHER): Payer: Medicare HMO | Admitting: Internal Medicine

## 2020-12-15 VITALS — BP 138/72 | HR 84 | Temp 99.0°F | Ht 66.0 in | Wt 196.0 lb

## 2020-12-15 DIAGNOSIS — M109 Gout, unspecified: Secondary | ICD-10-CM

## 2020-12-15 DIAGNOSIS — L03114 Cellulitis of left upper limb: Secondary | ICD-10-CM | POA: Diagnosis not present

## 2020-12-15 DIAGNOSIS — I10 Essential (primary) hypertension: Secondary | ICD-10-CM

## 2020-12-15 DIAGNOSIS — E1165 Type 2 diabetes mellitus with hyperglycemia: Secondary | ICD-10-CM | POA: Diagnosis not present

## 2020-12-15 MED ORDER — METHYLPREDNISOLONE ACETATE 80 MG/ML IJ SUSP
80.0000 mg | Freq: Once | INTRAMUSCULAR | Status: AC
  Administered 2020-12-15: 80 mg via INTRAMUSCULAR

## 2020-12-15 MED ORDER — OXYCODONE HCL 5 MG PO TABS: 5.0000 mg | ORAL_TABLET | Freq: Four times a day (QID) | ORAL | 0 refills | Status: DC | PRN

## 2020-12-15 MED ORDER — AZITHROMYCIN 250 MG PO TABS: ORAL_TABLET | ORAL | 1 refills | Status: AC

## 2020-12-15 MED ORDER — PREDNISONE 10 MG PO TABS: ORAL_TABLET | ORAL | 0 refills | Status: DC

## 2020-12-15 NOTE — Assessment & Plan Note (Signed)
Lab Results  Component Value Date   HGBA1C 6.1 (H) 10/28/2020   Stable, pt to continue current medical treatment  - diet

## 2020-12-15 NOTE — Assessment & Plan Note (Signed)
Cant completely r/o cellulitis - for zpack asd,  to f/u any worsening symptoms or concerns

## 2020-12-15 NOTE — Progress Notes (Signed)
Patient ID: [name], female   DOB: Jul 31, 1945, 75 y.o.   MRN: [id number]        Chief Complaint: follow up worsening left hand pain       HPI:  [name] is a 75 y.o. female here with c/o better, then worsneing left hand pain and swelling x 3 days now severe, sharp, constant, having been tx for gout and cellulitis in the past, without recent trauma, falls, or fever, chills.  Pt denies chest pain, increased sob or doe, wheezing, orthopnea, PND, increased LE swelling, palpitations, dizziness or syncope.   Pt denies polydipsia, polyuria, or new focal neuro s/s.          Wt Readings from Last 3 Encounters:  12/15/20 196 lb (88.9 kg)  11/03/20 201 lb (91.2 kg)  10/28/20 201 lb 4.5 oz (91.3 kg)   BP Readings from Last 3 Encounters:  12/15/20 138/72  11/03/20 100/66  10/30/20 126/77         Past Medical History:  Diagnosis Date   AAA (abdominal aortic aneurysm)    a. s/p stent graft repair 01/2016.   Acute ischemic colitis ([place]) 07/29/2010   Diagnosed June, 2012 characterized by acute lower GI bleeding, spontaneously resolved.    Acute respiratory infection 06/09/2014   Acute sinus infection 05/14/2015   Allergic rhinitis, cause unspecified    Angioedema of lips 06/03/2014   Anxiety state, unspecified    Arthritis of left hip 04/16/2015   Bilateral hearing loss 07/19/2017   Chronic LBP    Coronary artery disease    a. inferior MI 1998 s/p PCI of RCA. b. stenting of Cx 04/2010. c. DES to prox LAD 05/2013. d. DES x 3 in 10/2015. // Myoview 07/2018:  EF 59, normal perfusion, low risk    Coronary artery disease involving native heart without angina pectoris 07/31/2006   Qualifier: Diagnosis of  By: [name] MD, [name]  ANGIOGRAPHIC DATA:  1. Ventriculography done in the RAO projection reveals a small wall  motion abnormality in the mid inferior wall. Ejection fraction  would be estimated at around 50%.  2. The right coronary artery has some progressive disease of about 60-  80% in the proximal  mid segment. The distal vessel appears to be  50-70% narrowing althou   Degeneration of lumbar or lumbosacral intervertebral disc    Depressive disorder, not elsewhere classified    Diabetes ([place]) 11/03/2006   Qualifier: Diagnosis of  By: [name] MD, [name]    Diabetes mellitus    TYPE II   Frequent urination 05/08/2014   Gout 08/22/2013   History of endovascular stent graft for abdominal aortic aneurysm (AAA) 01/22/2016   Hyperlipidemia    Hypertension    Hypothyroidism    Iron deficiency anemia 06/13/2017   Lower GI bleed 06/2010   Diverticular bleed   Lumbar radiculopathy 05/15/2015   MENOPAUSAL DISORDER 10/29/2007   Qualifier: Diagnosis of  By: [name] MD, [name]    Myalgia 09/25/2013   Noncompliance with medications 02/26/2014   Obesity, unspecified    OTITIS MEDIA, SEROUS, CHRONIC 04/23/2007   Qualifier: Diagnosis of  By: [name] MD, [name]    Thoracic disc disease 11/19/2018   URTICARIA 09/23/2009   Qualifier: Diagnosis of  By: [name] MD, Mateo Flow A    Venous insufficiency 07/19/2017   Past Surgical History:  Procedure Laterality Date   CARDIAC CATHETERIZATION     PCI OF BOTH THE CIRCUMFLEX AND LEFT ANTERIOR DESCENDING ARTERY   CARDIAC  CATHETERIZATION N/A 10/29/2015   Procedure: Coronary Stent Intervention;  Surgeon: Nelva Bush, MD;  Location: [facility];  Service: Cardiovascular;  Laterality: N/A;   CARDIAC CATHETERIZATION N/A 10/29/2015   Procedure: Coronary/Graft Angiography;  Surgeon: Nelva Bush, MD;  Location: [facility];  Service: Cardiovascular;  Laterality: N/A;   CARDIAC CATHETERIZATION N/A 10/29/2015   Procedure: Intravascular Pressure Wire/FFR Study;  Surgeon: Nelva Bush, MD;  Location: [facility];  Service: Cardiovascular;  Laterality: N/A;   CESAREAN SECTION     ENDOVASCULAR STENT INSERTION N/A 01/22/2016   Procedure: ABDOMINAL AORTIC ENDOVASCULAR STENT GRAFT INSERTION;  Surgeon: Rosetta Posner, MD;  Location: [facility];  Service: Vascular;   Laterality: N/A;   HEART STENT  04-2010  and  Jun 07, 2013   X 3   LEFT HEART CATH AND CORONARY ANGIOGRAPHY N/A 04/03/2019   Procedure: LEFT HEART CATH AND CORONARY ANGIOGRAPHY;  Surgeon: Nelva Bush, MD;  Location: [facility];  Service: Cardiovascular;  Laterality: N/A;   LEFT HEART CATHETERIZATION WITH CORONARY ANGIOGRAM N/A 06/07/2013   Procedure: LEFT HEART CATHETERIZATION WITH CORONARY ANGIOGRAM;  Surgeon: [name], MD;  [facility];  Service: Cardiovascular;  Laterality: N/A;   LEFT HEART CATHETERIZATION WITH CORONARY ANGIOGRAM N/A 02/25/2014   Procedure: LEFT HEART CATHETERIZATION WITH CORONARY ANGIOGRAM;  Surgeon: Troy Sine, MD;  Location: [facility] C[facility] LAB;  Service: Cardiovascular;  Laterality: N/A;   LUMBAR FUSION  01/2007   DR. Patrice Paradise...3-LEVEL WITH FIXATION   OOPHORECTOMY     BSO? pt.unsure   PARATHYROIDECTOMY     SPINE SURGERY     THYROIDECTOMY     TOTAL ABDOMINAL HYSTERECTOMY      reports that she quit smoking about 34 years ago. Her smoking use included cigarettes. She has never used smokeless tobacco. She reports that she does not drink alcohol and does not use drugs. family history includes Arthritis in her maternal aunt; Breast cancer in her maternal aunt; Diabetes in her brother and maternal aunt; Heart attack (age of onset: 105) in her mother; Heart attack (age of onset: 89) in her father; Heart disease in her father and mother. Allergies  Allergen Reactions   Crestor [Rosuvastatin Calcium] Other (See Comments)    Feeling poor   Prilosec [Omeprazole] Other (See Comments)    Chest pain   Miconazole Nitrate Hives    REACTION: hives   Augmentin [Amoxicillin-Pot Clavulanate] Hives, Itching and Rash    Has patient had a PCN reaction causing immediate rash, facial/tongue/throat swelling, SOB or lightheadedness with hypotension:unsure Has patient had a PCN reaction causing severe rash involving mucus membranes or skin necrosis:unsure Has  patient had a PCN reaction that required hospitalization:No Has patient had a PCN reaction occurring within the last 10 years:NO If all of the above answers are "NO", then may proceed with Cephalosporin use. Has patient had a PCN reaction causing immediate rash, facial/tongue/throat swelling, SOB or lightheadedness with hypotension:unsure Has patient had a PCN reaction causing severe rash involving mucus membranes or skin necrosis:unsure Has patient had a PCN reaction that required hospitalization:No Has patient had a PCN reaction occurring within the last 10 years:NO If all of the above answers are "NO", then may proceed with Cephalosporin use.    Doxycycline Other (See Comments)    REACTION: gi upset   Current Outpatient Medications on File Prior to Visit  Medication Sig Dispense Refill   allopurinol (ZYLOPRIM) 300 MG tablet Take 1 tablet (300 mg total) by mouth  as needed (for Gout). 90 tablet 3   aspirin 81 MG EC tablet Take 81 mg by mouth daily.      atenolol (TENORMIN) 25 MG tablet Take 1 tablet (25 mg total) by mouth daily. TAKE 1 TABLET TWICE DAILY (Patient taking differently: Take 25 mg by mouth daily.) 180 tablet 0   atorvastatin (LIPITOR) 80 MG tablet Take 1 tablet (80 mg total) by mouth daily.  1   celecoxib (CELEBREX) 200 MG capsule Take 1 capsule (200 mg total) by mouth 2 (two) times daily as needed for moderate pain. 180 capsule 1   Cholecalciferol (THERA-D 2000) 50 MCG (2000 UT) TABS 1 tab by mouth once daily (Patient taking differently: Take 1 tablet by mouth daily at 12 noon.) 90 tablet 99   isosorbide mononitrate (IMDUR) 30 MG 24 hr tablet Take 0.5 tablets (15 mg total) by mouth daily. 45 tablet 3   nitroGLYCERIN (NITROSTAT) 0.4 MG SL tablet Place 1 tablet (0.4 mg total) under the tongue every 5 (five) minutes as needed for chest pain. 25 tablet 3   pantoprazole (PROTONIX) 40 MG tablet Take 40 mg by mouth daily as needed (heartburn acid reflux).     PFIZER COVID-19 VAC  BIVALENT injection      pregabalin (LYRICA) 75 MG capsule Take 75 mg by mouth daily as needed (pain).      SYNTHROID 150 MCG tablet TAKE 1 TABLET(150 MCG) BY MOUTH DAILY 90 tablet 0   tiZANidine (ZANAFLEX) 2 MG tablet Take 1 tablet (2 mg total) by mouth every 6 (six) hours as needed for muscle spasms. 40 tablet 1   triamcinolone (NASACORT) 55 MCG/ACT AERO nasal inhaler Place 2 sprays into the nose as needed (congestion). 1 each 12   colchicine 0.6 MG tablet Take 1 tablet (0.6 mg total) by mouth daily for 3 days. 90 tablet 3   No current facility-administered medications on file prior to visit.        ROS:  All others reviewed and negative.  Objective        PE:  BP 138/72 (BP Location: Left Arm, Patient Position: Sitting, Cuff Size: Large)   Pulse 84   Temp 99 F (37.2 C) (Oral)   Ht 5\' 6"  (1.676 m)   Wt 196 lb (88.9 kg)   SpO2 93%   BMI 31.64 kg/m                 Constitutional: Pt appears in NAD               HENT: Head: NCAT.                Right Ear: External ear normal.                 Left Ear: External ear normal.                Eyes: . Pupils are equal, round, and reactive to light. Conjunctivae and EOM are normal               Nose: without d/c or deformity               Neck: Neck supple. Gross normal ROM               Cardiovascular: Normal rate and regular rhythm.                 Pulmonary/Chest: Effort normal and breath sounds without rales or wheezing.  Left hand with diffuse severe warm, tender, erythema seems worst at the first mcp o/w neuro fasc intact               Neurological: Pt is alert. At baseline orientation, motor grossly intact               Skin: Skin is warm. No rashes, no other new lesions, LE edema - none               Psychiatric: Pt behavior is normal without agitation   Micro: none  Cardiac tracings I have personally interpreted today:  none  Pertinent Radiological findings (summarize): none   Lab Results  Component Value  Date   WBC 8.0 10/29/2020   HGB 13.3 10/29/2020   HCT 42.6 10/29/2020   PLT 324 10/29/2020   GLUCOSE 125 (H) 10/29/2020   CHOL 143 10/30/2020   TRIG 130 10/30/2020   HDL 25 (L) 10/30/2020   LDLDIRECT 131.0 03/27/2015   LDLCALC 94 10/30/2020   ALT 19 10/30/2020   AST 26 10/30/2020   NA 136 10/29/2020   K 3.7 10/29/2020   CL 98 10/29/2020   CREATININE 0.99 10/29/2020   BUN 9 10/29/2020   CO2 27 10/29/2020   TSH 2.32 03/19/2020   INR 1.08 01/22/2016   HGBA1C 6.1 (H) 10/28/2020   MICROALBUR 0.8 03/19/2020   Assessment/Plan:  [name] is a 75 y.o. Black or African American [2] female with  has a past medical history of AAA (abdominal aortic aneurysm), Acute ischemic colitis ([place]) (07/29/2010), Acute respiratory infection (06/09/2014), Acute sinus infection (05/14/2015), Allergic rhinitis, cause unspecified, Angioedema of lips (06/03/2014), Anxiety state, unspecified, Arthritis of left hip (04/16/2015), Bilateral hearing loss (07/19/2017), Chronic LBP, Coronary artery disease, Coronary artery disease involving native heart without angina pectoris (07/31/2006), Degeneration of lumbar or lumbosacral intervertebral disc, Depressive disorder, not elsewhere classified, Diabetes ([place]) (11/03/2006), Diabetes mellitus, Frequent urination (05/08/2014), Gout (08/22/2013), History of endovascular stent graft for abdominal aortic aneurysm (AAA) (01/22/2016), Hyperlipidemia, Hypertension, Hypothyroidism, Iron deficiency anemia (06/13/2017), Lower GI bleed (06/2010), Lumbar radiculopathy (05/15/2015), MENOPAUSAL DISORDER (10/29/2007), Myalgia (09/25/2013), Noncompliance with medications (02/26/2014), Obesity, unspecified, OTITIS MEDIA, SEROUS, CHRONIC (04/23/2007), Thoracic disc disease (11/19/2018), URTICARIA (09/23/2009), and Venous insufficiency (07/19/2017).  Acute gout Now worsening again x 3 days despite good med compliance including allopurinol and colchicine, for depomedrol IM 80, predpac asd, oxycodone prn,  to f/u any  worsening symptoms or concerns  Cellulitis of left hand Cant completely r/o cellulitis - for zpack asd,  to f/u any worsening symptoms or concerns   Diabetes ([place]) Lab Results  Component Value Date   HGBA1C 6.1 (H) 10/28/2020   Stable, pt to continue current medical treatment  - diet   Essential hypertension BP Readings from Last 3 Encounters:  12/15/20 138/72  11/03/20 100/66  10/30/20 126/77   Stable, pt to continue medical treatment tenormin  Followup: Return if symptoms worsen or fail to improve.  [name], MD 12/15/2020 9:07 PM [facility]

## 2020-12-15 NOTE — Patient Instructions (Signed)
You had the steroid shot today  Please take all new medication as prescribed - the pain medication, prednisone, and antibiotic  Please continue all other medications as before, and refills have been done if requested.  Please have the pharmacy call with any other refills you may need.  Please keep your appointments with your specialists as you may have planned

## 2020-12-15 NOTE — Assessment & Plan Note (Signed)
BP Readings from Last 3 Encounters:  12/15/20 138/72  11/03/20 100/66  10/30/20 126/77   Stable, pt to continue medical treatment tenormin

## 2020-12-15 NOTE — Assessment & Plan Note (Signed)
Now worsening again x 3 days despite good med compliance including allopurinol and colchicine, for depomedrol IM 80, predpac asd, oxycodone prn,  to f/u any worsening symptoms or concerns

## 2020-12-17 ENCOUNTER — Encounter: Payer: Self-pay | Admitting: Gastroenterology

## 2020-12-25 ENCOUNTER — Telehealth: Payer: Self-pay

## 2020-12-25 ENCOUNTER — Telehealth: Payer: Self-pay | Admitting: Cardiovascular Disease

## 2020-12-25 MED ORDER — HYDROCHLOROTHIAZIDE 12.5 MG PO CAPS: 12.5000 mg | ORAL_CAPSULE | Freq: Every day | ORAL | 3 refills | Status: DC

## 2020-12-25 NOTE — Telephone Encounter (Signed)
Ok for hct 12.5 qd

## 2020-12-25 NOTE — Telephone Encounter (Signed)
Pt reports ankle swelling bilaterally, concerned about how bad the top of her feet are swelling. States legs are slightly swollen too but now like ankles/feet. Started day before yesterday. States she also called PCP and they had no appts. She denies dyspnea, weight gain. She does not weigh herself.  Discussed w/ Irish Lack. Pt advised to elevate legs over weekend. Watch salt intake. Pt scheduled to see Dr. Burt Knack on Monday for evaluation to determine if heart related issue. Patient verbalized understanding and agreeable to plan.

## 2020-12-25 NOTE — Telephone Encounter (Signed)
Calling to see if there something she can take for her ankles being swollen. She thinks its back up fluid

## 2020-12-25 NOTE — Telephone Encounter (Signed)
Patient states that she has severe swelling in feet and ankles. Requesting prescription. Patient states that she was given something in the past for swelling.

## 2020-12-28 ENCOUNTER — Ambulatory Visit (INDEPENDENT_AMBULATORY_CARE_PROVIDER_SITE_OTHER): Payer: Medicare HMO | Admitting: Cardiovascular Disease

## 2020-12-28 ENCOUNTER — Encounter: Payer: Self-pay | Admitting: Cardiovascular Disease

## 2020-12-28 ENCOUNTER — Other Ambulatory Visit: Payer: Self-pay

## 2020-12-28 VITALS — BP 120/90 | HR 82 | Ht 66.0 in | Wt 195.4 lb

## 2020-12-28 DIAGNOSIS — I25118 Atherosclerotic heart disease of native coronary artery with other forms of angina pectoris: Secondary | ICD-10-CM | POA: Diagnosis not present

## 2020-12-28 DIAGNOSIS — I7143 Infrarenal abdominal aortic aneurysm, without rupture: Secondary | ICD-10-CM

## 2020-12-28 DIAGNOSIS — I5043 Acute on chronic combined systolic (congestive) and diastolic (congestive) heart failure: Secondary | ICD-10-CM | POA: Diagnosis not present

## 2020-12-28 DIAGNOSIS — I10 Essential (primary) hypertension: Secondary | ICD-10-CM

## 2020-12-28 DIAGNOSIS — E782 Mixed hyperlipidemia: Secondary | ICD-10-CM | POA: Diagnosis not present

## 2020-12-28 MED ORDER — POTASSIUM CHLORIDE ER 10 MEQ PO TBCR: 10.0000 meq | EXTENDED_RELEASE_TABLET | Freq: Every day | ORAL | 3 refills | Status: DC

## 2020-12-28 MED ORDER — FUROSEMIDE 40 MG PO TABS: 40.0000 mg | ORAL_TABLET | Freq: Every day | ORAL | 3 refills | Status: DC

## 2020-12-28 NOTE — Progress Notes (Signed)
Cardiology Office Note:    Date:  12/29/2020   ID:  [name], DOB [birth year], MRN [id number]  PCP:  Biagio Borg, MD   [place] Prov[facility] Cardiologist:  Sherren Mocha, MD Cardiology APP:  Sh[facility]on [place]     Referring MD: Biagio Borg, MD   Chief Complaint  Patient presents with   Foot Swelling     History of Present Illness:    [name] is a 75 y.o. female with a hx of: Coronary artery disease  s/p prior inf MI in 1998 tx with stent to the RCA S/p stent LCx in 2012 S/p stent to the LAD in 2015 S/p DES x 3 to the RCA 2017 Myoview 07/2018: low risk Cath 03/2019: dRCA diff dz'd supplying small/dz'd PDA+PL br (not ideal for PCI) >> med Rx Diabetes mellitus  Hypertension Hyperlipidemia AAA S/p EVAR 01/2016  The patient is here alone today.  She called in recently with worsening swelling in her legs and feet.  Stated that it was difficult for her to get her shoes on.  She also complains of increased fatigue and shortness of breath with activity.  She denies orthopnea or PND.  No chest pain or pressure.  Reports compliance with her medications.  She has eaten salty foods on occasion and states that she likes [place] food and hibachi type food.  However, she states she does not eat this very often at all.  She is no longer smoking.  Past Medical History:  Diagnosis Date   AAA (abdominal aortic aneurysm)    a. s/p stent graft repair 01/2016.   Acute ischemic colitis ([place]) 07/29/2010   Diagnosed June, 2012 characterized by acute lower GI bleeding, spontaneously resolved.    Acute respiratory infection 06/09/2014   Acute sinus infection 05/14/2015   Allergic rhinitis, cause unspecified    Angioedema of lips 06/03/2014   Anxiety state, unspecified    Arthritis of left hip 04/16/2015   Bilateral hearing loss 07/19/2017   Chronic LBP    Coronary artery disease    a. inferior MI 1998 s/p PCI of RCA. b. stenting of Cx 04/2010. c. DES to prox LAD 05/2013. d. DES x 3  in 10/2015. // Myoview 07/2018:  EF 59, normal perfusion, low risk    Coronary artery disease involving native heart without angina pectoris 07/31/2006   Qualifier: Diagnosis of  By: [name] MD, [name]  ANGIOGRAPHIC DATA:  1. Ventriculography done in the RAO projection reveals a small wall  motion abnormality in the mid inferior wall. Ejection fraction  would be estimated at around 50%.  2. The right coronary artery has some progressive disease of about 60-  80% in the proximal mid segment. The distal vessel appears to be  50-70% narrowing althou   Degeneration of lumbar or lumbosacral intervertebral disc    Depressive disorder, not elsewhere classified    Diabetes ([place]) 11/03/2006   Qualifier: Diagnosis of  By: [name] MD, [name]    Diabetes mellitus    TYPE II   Frequent urination 05/08/2014   Gout 08/22/2013   History of endovascular stent graft for abdominal aortic aneurysm (AAA) 01/22/2016   Hyperlipidemia    Hypertension    Hypothyroidism    Iron deficiency anemia 06/13/2017   Lower GI bleed 06/2010   Diverticular bleed   Lumbar radiculopathy 05/15/2015   MENOPAUSAL DISORDER 10/29/2007   Qualifier: Diagnosis of  By: [name] MD, [name]    Myalgia 09/25/2013  Noncompliance with medications 02/26/2014   Obesity, unspecified    OTITIS MEDIA, SEROUS, CHRONIC 04/23/2007   Qualifier: Diagnosis of  By: [name] MD, [name]    Thoracic disc disease 11/19/2018   URTICARIA 09/23/2009   Qualifier: Diagnosis of  By: [name] MD, Mateo Flow A    Venous insufficiency 07/19/2017    Past Surgical History:  Procedure Laterality Date   CARDIAC CATHETERIZATION     PCI OF BOTH THE CIRCUMFLEX AND LEFT ANTERIOR DESCENDING ARTERY   CARDIAC CATHETERIZATION N/A 10/29/2015   Procedure: Coronary Stent Intervention;  Surgeon: Nelva Bush, MD;  Location: [facility];  Service: Cardiovascular;  Laterality: N/A;   CARDIAC CATHETERIZATION N/A 10/29/2015   Procedure: Coronary/Graft Angiography;  Surgeon: Nelva Bush, MD;  Location: [facility];  Service: Cardiovascular;  Laterality: N/A;   CARDIAC CATHETERIZATION N/A 10/29/2015   Procedure: Intravascular Pressure Wire/FFR Study;  Surgeon: Nelva Bush, MD;  Location: [facility];  Service: Cardiovascular;  Laterality: N/A;   CESAREAN SECTION     ENDOVASCULAR STENT INSERTION N/A 01/22/2016   Procedure: ABDOMINAL AORTIC ENDOVASCULAR STENT GRAFT INSERTION;  Surgeon: Rosetta Posner, MD;  Location: [facility];  Service: Vascular;  Laterality: N/A;   HEART STENT  04-2010  and  Jun 07, 2013   X 3   LEFT HEART CATH AND CORONARY ANGIOGRAPHY N/A 04/03/2019   Procedure: LEFT HEART CATH AND CORONARY ANGIOGRAPHY;  Surgeon: Nelva Bush, MD;  Location: [facility];  Service: Cardiovascular;  Laterality: N/A;   LEFT HEART CATHETERIZATION WITH CORONARY ANGIOGRAM N/A 06/07/2013   Procedure: LEFT HEART CATHETERIZATION WITH CORONARY ANGIOGRAM;  Surgeon: [name], MD;  Location: [facility];  Service: Cardiovascular;  Laterality: N/A;   LEFT HEART CATHETERIZATION WITH CORONARY ANGIOGRAM N/A 02/25/2014   Procedure: LEFT HEART CATHETERIZATION WITH CORONARY ANGIOGRAM;  Surgeon: Troy Sine, MD;  Location: [facility];  Service: Cardiovascular;  Laterality: N/A;   LUMBAR FUSION  01/2007   DR. Patrice Paradise...3-LEVEL WITH FIXATION   OOPHORECTOMY     BSO? pt.unsure   PARATHYROIDECTOMY     SPINE SURGERY     THYROIDECTOMY     TOTAL ABDOMINAL HYSTERECTOMY      Current Medications: Current Meds  Medication Sig   allopurinol (ZYLOPRIM) 300 MG tablet Take 1 tablet (300 mg total) by mouth as needed (for Gout).   aspirin 81 MG EC tablet Take 81 mg by mouth daily.    atenolol (TENORMIN) 25 MG tablet Take 1 tablet (25 mg total) by mouth daily. TAKE 1 TABLET TWICE DAILY (Patient taking differently: Take 25 mg by mouth daily.)   atorvastatin (LIPITOR) 80 MG tablet Take 1 tablet (80 mg total) by mouth daily.   celecoxib (CELEBREX) 200 MG capsule Take 1 capsule  (200 mg total) by mouth 2 (two) times daily as needed for moderate pain.   furosemide (LASIX) 40 MG tablet Take 1 tablet (40 mg total) by mouth daily.   hydrochlorothiazide (MICROZIDE) 12.5 MG capsule Take 1 capsule (12.5 mg total) by mouth daily.   nitroGLYCERIN (NITROSTAT) 0.4 MG SL tablet Place 1 tablet (0.4 mg total) under the tongue every 5 (five) minutes as needed for chest pain.   oxyCODONE (ROXICODONE) 5 MG immediate release tablet Take 1 tablet (5 mg total) by mouth every 6 (six) hours as needed for severe pain.   PFIZER COVID-19 VAC BIVALENT injection    potassium chloride (KLOR-CON) 10 MEQ tablet Take 1 tablet (10 mEq total) by mouth daily.  pregabalin (LYRICA) 75 MG capsule Take 75 mg by mouth daily as needed (pain).    SYNTHROID 150 MCG tablet TAKE 1 TABLET(150 MCG) BY MOUTH DAILY     Allergies:   Crestor [rosuvastatin calcium], Prilosec [omeprazole], Miconazole nitrate, Augmentin [amoxicillin-pot clavulanate], and Doxycycline   Social History   Socioeconomic History   Marital status: Divorced    Spouse name: Not on file   Number of children: 3   Years of education: 13   Highest education level: Not on file  Occupational History   Occupation: RETIRED    Employer: [employer]    Comment: [employer]    Employer: RETIRED  Tobacco Use   Smoking status: Former    Years: 30.00    Types: Cigarettes    Quit date: 05/31/1986    Years since quitting: 34.6   Smokeless tobacco: Never  Vaping Use   Vaping Use: Never used  Substance and Sexual Activity   Alcohol use: No   Drug use: No   Sexual activity: Not Currently    Comment: 1st intercourse 75 yo-Fewer than 5 partners  Other Topics Concern   Not on file  Social History Narrative   DIVORCED   3 CHILDREN   PATIENT SIGNED A DESIGNATED PARTY RELEASE TO ALLOW HER DAUGHTER, TRAMAINE Freestone, TO HAVE ACCESS TO HER MEDICAL RECORDS/INFORMATION. [name], May 04, 2009 @ 3:27 PM   Right handed   One story home    .   Social Determinants of Health   Financial Resource Strain: Not on file  Food Insecurity: Not on file  Transportation Needs: Not on file  Physical Activity: Not on file  Stress: Not on file  Social Connections: Not on file     Family History: The patient's family history includes Arthritis in her maternal aunt; Breast cancer in her maternal aunt; Diabetes in her brother and maternal aunt; Heart attack (age of onset: 48) in her mother; Heart attack (age of onset: 60) in her father; Heart disease in her father and mother. There is no history of Colon cancer.  ROS:   Please see the history of present illness.    All other systems reviewed and are negative.  EKGs/Labs/Other Studies Reviewed:    The following studies were reviewed today: Echo 02/09/2018: Study Conclusions   - Left ventricle: The cavity size was normal. Wall thickness was    normal. Systolic function was normal. The estimated ejection    fraction was in the range of 50% to 55%. Wall motion was normal;    there were no regional wall motion abnormalities. Left    ventricular diastolic function parameters were normal.  - Aortic valve: Sclerosis without stenosis.  - Mitral valve: Moderately calcified annulus. Mildly thickened    leaflets .  - Atrial septum: No defect or patent foramen ovale was identified.   Cardiac Cath 04/03/2019: Conclusions: Multivessel coronary artery disease, as detailed below.  There may have been slight interval progression in the mid/distal RCA disease, though otherwise the angiographic appearance of the coronary arteries is similar to the most recent study in 10/2015. Normal left ventricular systolic function with mildly elevated filling pressure.   Recommendations: Images reviewed with interventional team.  We agree that the distal RCA is diffusely disease and supplies relatively small/diseased PDA and PL branches; this is not an ideal target for PCI.  Other disease, the most severe of  which affects the small/distal branches, has not changed significantly since 2017. Start isosorbide mononitrate 30  mg daily and wean nitroglycerin infusion, as tolerated.  Reserve PCI to RCA and/or LCx for refractory symptoms. Aggressive secondary prevention, including high-intensity statin therapy.  EKG:  EKG is ordered today.  The ekg ordered today demonstrates NSR 82 bpm, RBBB  Recent Labs: 03/19/2020: TSH 2.32 10/29/2020: Hemoglobin 13.3; Platelets 324 10/30/2020: ALT 19 12/28/2020: BUN 3; Creatinine, Ser 0.83; NT-Pro BNP WILL FOLLOW; Potassium 4.6; Sodium 139  Recent Lipid Panel    Component Value Date/Time   CHOL 143 10/30/2020 1211   TRIG 130 10/30/2020 1211   TRIG 102 01/02/2006 0825   HDL 25 (L) 10/30/2020 1211   CHOLHDL 5.7 (H) 10/30/2020 1211   CHOLHDL 5 03/19/2020 1708   VLDL 22.4 03/19/2020 1708   LDLCALC 94 10/30/2020 1211   LDLDIRECT 131.0 03/27/2015 1137     Risk Assessment/Calculations:           Physical Exam:    VS:  BP 120/90   Pulse 82   Ht 5\' 6"  (1.676 m)   Wt 195 lb 6.4 oz (88.6 kg)   SpO2 95%   BMI 31.54 kg/m     Wt Readings from Last 3 Encounters:  12/28/20 195 lb 6.4 oz (88.6 kg)  12/15/20 196 lb (88.9 kg)  11/03/20 201 lb (91.2 kg)     GEN:  Well nourished, well developed in no acute distress HEENT: Normal NECK: No JVD; No carotid bruits LYMPHATICS: No lymphadenopathy CARDIAC: RRR, no murmurs, rubs, gallops RESPIRATORY:  Clear to auscultation without rales, wheezing or rhonchi  ABDOMEN: Soft, non-tender, non-distended MUSCULOSKELETAL: 2+ bilateral pedal edema, trace bilateral pretibial edema.; No deformity  SKIN: Warm and dry NEUROLOGIC:  Alert and oriented x 3 PSYCHIATRIC:  Normal affect   ASSESSMENT:    1. Coronary artery disease of native artery of native heart with stable angina pectoris ([place])   2. Acute on chronic combined systolic and diastolic CHF (congestive heart failure) ([place])   3. Essential hypertension   4. Mixed  hyperlipidemia   5. Infrarenal abdominal aortic aneurysm (AAA) without rupture    PLAN:    In order of problems listed above:  Appears stable without symptoms of angina.  Most recent heart catheterization reviewed as above.  Continues on aspirin for antiplatelet therapy, beta-blocker, and high intensity statin drug.  Antianginal program includes her beta-blocker and long-acting nitrate. I am concerned about her edema and progressive fatigue with dyspnea.  Suspect she has developed diastolic heart failure.  Will add furosemide 40 mg daily with K-Dur 10 mill equivalents daily.  We will check an echocardiogram to evaluate LV systolic and diastolic function.  Discussed sodium restriction.  Otherwise continue current medications.  We will check a metabolic panel and BNP today. Blood pressure controlled on hydrochlorothiazide, atenolol, and isosorbide.  Add furosemide as above. Status post EVAR.  Followed by vascular surgery.    Medication Adjustments/Labs and Tests Ordered: Current medicines are reviewed at length with the patient today.  Concerns regarding medicines are outlined above.  Orders Placed This Encounter  Procedures   Basic metabolic panel   Pro b natriuretic peptide   EKG 12-Lead   ECHOCARDIOGRAM COMPLETE   Meds ordered this encounter  Medications   furosemide (LASIX) 40 MG tablet    Sig: Take 1 tablet (40 mg total) by mouth daily.    Dispense:  90 tablet    Refill:  3   potassium chloride (KLOR-CON) 10 MEQ tablet    Sig: Take 1 tablet (10 mEq total) by mouth daily.  Dispense:  90 tablet    Refill:  3    Patient Instructions  Medication Instructions:  1) START Furosemide 40mg  once daily 2) START Potassium Chloride 68meq once daily  *If you need a refill on your cardiac medications before your next appointment, please call your pharmacy*   Lab Work: BMET and Pro BNP today  If you have labs (blood work) drawn today and your tests are completely normal, you will  receive your results only by: [place] (if you have MyChart) OR A paper copy in the mail If you have any lab test that is abnormal or we need to change your treatment, we will call you to review the results.   Testing/Procedures: Your physician has requested that you have an echocardiogram. Echocardiography is a painless test that uses sound waves to create images of your heart. It provides your doctor with information about the size and shape of your heart and how well your heart's chambers and valves are working. This procedure takes approximately one hour. There are no restrictions for this procedure.   Follow-Up: At [facility], you and your health needs are our priority.  As part of our continuing mission to provide you with exceptional heart care, we have created designated Provider Care Teams.  These Care Teams include your primary Cardiologist (physician) and Advanced Practice Providers (APPs -  Physician Assistants and Nurse Practitioners) who all work together to provide you with the care you need, when you need it.  We recommend signing up for the patient portal called "MyChart".  Sign up information is provided on this After Visit Summary.  MyChart is used to connect with patients for Virtual Visits (Telemedicine).  Patients are able to view lab/test results, encounter notes, upcoming appointments, etc.  Non-urgent messages can be sent to your provider as well.   To learn more about what you can do with MyChart, go to [email or web address].    Your next appointment:   3 month(s)  The format for your next appointment:   In Person  Provider:   Richardson Dopp, PA-C     Then, Sherren Mocha, MD will plan to see you again in 6 month(s).    Other Instructions   Signed, Sherren Mocha, MD  12/29/2020 11:33 AM    [place]

## 2020-12-28 NOTE — Telephone Encounter (Signed)
Tried calling patient at both numbers. No answer. Left message at home number for patient to call me back

## 2020-12-28 NOTE — Patient Instructions (Addendum)
Medication Instructions:  1) START Furosemide 40mg  once daily 2) START Potassium Chloride 9meq once daily  *If you need a refill on your cardiac medications before your next appointment, please call your pharmacy*   Lab Work: BMET and Pro BNP today  If you have labs (blood work) drawn today and your tests are completely normal, you will receive your results only by: [place] (if you have MyChart) OR A paper copy in the mail If you have any lab test that is abnormal or we need to change your treatment, we will call you to review the results.   Testing/Procedures: Your physician has requested that you have an echocardiogram. Echocardiography is a painless test that uses sound waves to create images of your heart. It provides your doctor with information about the size and shape of your heart and how well your heart's chambers and valves are working. This procedure takes approximately one hour. There are no restrictions for this procedure.   Follow-Up: At [facility], you and your health needs are our priority.  As part of our continuing mission to provide you with exceptional heart care, we have created designated Provider Care Teams.  These Care Teams include your primary Cardiologist (physician) and Advanced Practice Providers (APPs -  Physician Assistants and Nurse Practitioners) who all work together to provide you with the care you need, when you need it.  We recommend signing up for the patient portal called "MyChart".  Sign up information is provided on this After Visit Summary.  MyChart is used to connect with patients for Virtual Visits (Telemedicine).  Patients are able to view lab/test results, encounter notes, upcoming appointments, etc.  Non-urgent messages can be sent to your provider as well.   To learn more about what you can do with MyChart, go to [email or web address].    Your next appointment:   3 month(s)  The format for your next appointment:   In  Person  Provider:   Richardson Dopp, PA-C     Then, Sherren Mocha, MD will plan to see you again in 6 month(s).    Other Instructions

## 2020-12-29 LAB — PRO B NATRIURETIC PEPTIDE: NT-Pro BNP: 73 pg/mL (ref 0–738)

## 2020-12-29 LAB — BASIC METABOLIC PANEL
BUN/Creatinine Ratio: 4 — ABNORMAL LOW (ref 12–28)
BUN: 3 mg/dL — ABNORMAL LOW (ref 8–27)
CO2: 25 mmol/L (ref 20–29)
Calcium: 9 mg/dL (ref 8.7–10.3)
Chloride: 100 mmol/L (ref 96–106)
Creatinine, Ser: 0.83 mg/dL (ref 0.57–1.00)
Glucose: 85 mg/dL (ref 70–99)
Potassium: 4.6 mmol/L (ref 3.5–5.2)
Sodium: 139 mmol/L (ref 134–144)
eGFR: 73 mL/min/{1.73_m2} (ref >59)

## 2021-01-05 NOTE — Progress Notes (Signed)
I, [name], LAT, ATC acting as a scribe for Lynne Leader, MD.  [name] is a 75 y.o. female who presents to [place] at [facility] today for cont bilat knee pain due to severe DJD. Pt was last seen by Dr. Georgina Snell on 10/01/20 and was given bilat knee steroid injections and was advised to cont quad strengthening exercises. Today, pt reports bilat knees became painful again over the last few weeks. Pt notes she did have some swelling in bilat ankles, but it's resolved.  Dx imaging: 11/21/19 R & L knee XR  Pertinent review of systems: No fevers or chills  Relevant historical information: Vascular disease.  Diabetes   Exam:  BP 138/80    Pulse 83    Ht 5\' 6"  (1.676 m)    Wt 194 lb 3.2 oz (88.1 kg)    SpO2 98%    BMI 31.34 kg/m  General: Well Developed, well nourished, and in no acute distress.   MSK: Right knee mild effusion normal motion with crepitation. Tender palpation medial joint line. Intact strength.  Left knee mild effusion.  Normal motion with crepitation. Medial joint line. Intact strength.    Lab and Radiology Results  Procedure: Real-time Ultrasound Guided Injection of right knee superior lateral patellar space Device: Philips Affiniti 50G Images permanently stored and available for review in PACS Verbal informed consent obtained.  Discussed risks and benefits of procedure. Warned about infection bleeding damage to structures skin hypopigmentation and fat atrophy among others. Patient expresses understanding and agreement Time-out conducted.   Noted no overlying erythema, induration, or other signs of local infection.   Skin prepped in a sterile fashion.   Local anesthesia: Topical Ethyl chloride.   With sterile technique and under real time ultrasound guidance: 40 mg of Kenalog and 2 mL of Marcaine injected into knee joint. Fluid seen entering the joint capsule.   Completed without difficulty   Pain immediately resolved suggesting  accurate placement of the medication.   Advised to call if fevers/chills, erythema, induration, drainage, or persistent bleeding.   Images permanently stored and available for review in the ultrasound unit.  Impression: Technically successful ultrasound guided injection.    Procedure: Real-time Ultrasound Guided Injection of left knee superior lateral patellar space Device: Philips Affiniti 50G Images permanently stored and available for review in PACS Verbal informed consent obtained.  Discussed risks and benefits of procedure. Warned about infection bleeding damage to structures skin hypopigmentation and fat atrophy among others. Patient expresses understanding and agreement Time-out conducted.   Noted no overlying erythema, induration, or other signs of local infection.   Skin prepped in a sterile fashion.   Local anesthesia: Topical Ethyl chloride.   With sterile technique and under real time ultrasound guidance: 40 mg of Kenalog and 2 mL of Marcaine injected into knee joint. Fluid seen entering the joint capsule.   Completed without difficulty   Pain immediately resolved suggesting accurate placement of the medication.   Advised to call if fevers/chills, erythema, induration, drainage, or persistent bleeding.   Images permanently stored and available for review in the ultrasound unit.  Impression: Technically successful ultrasound guided injection.   EXAM: LEFT KNEE 3 VIEWS; RIGHT KNEE 3 VIEWS   COMPARISON:  None.   FINDINGS: No fracture or dislocation of the bilateral knees. There is severe, symmetric bilateral tricompartmental arthrosis of the knees, with near total bone-on-bone joint space loss, most severe in the patellofemoral compartments bilaterally., of note, there are multiple calcified loose  bodies within the superior joint space of the left knee and within the posterior joint spaces of the bilateral knees. No knee joint effusion. Vascular calcinosis.    IMPRESSION: 1.  No fracture or dislocation of the bilateral knees.   2. There is severe, symmetric bilateral tricompartmental arthrosis of the knees, with near total bone-on-bone joint space loss, most severe in the patellofemoral compartments bilaterally.   3. There are multiple calcified loose bodies within the superior joint space of the left knee and within the posterior joint spaces of the bilateral knees.     Electronically Signed   By: Eddie Candle M.D.   On: 11/23/2019 15:50    EXAM: LEFT KNEE 3 VIEWS; RIGHT KNEE 3 VIEWS   COMPARISON:  None.   FINDINGS: No fracture or dislocation of the bilateral knees. There is severe, symmetric bilateral tricompartmental arthrosis of the knees, with near total bone-on-bone joint space loss, most severe in the patellofemoral compartments bilaterally., of note, there are multiple calcified loose bodies within the superior joint space of the left knee and within the posterior joint spaces of the bilateral knees. No knee joint effusion. Vascular calcinosis.   IMPRESSION: 1.  No fracture or dislocation of the bilateral knees.   2. There is severe, symmetric bilateral tricompartmental arthrosis of the knees, with near total bone-on-bone joint space loss, most severe in the patellofemoral compartments bilaterally.   3. There are multiple calcified loose bodies within the superior joint space of the left knee and within the posterior joint spaces of the bilateral knees.     Electronically Signed   By: Eddie Candle M.D.   On: 11/23/2019 15:50  I, Lynne Leader, personally (independently) visualized and performed the interpretation of the images attached in this note.     Assessment and Plan: 75 y.o. female with bilateral knee pain due to DJD exacerbation and loose bodies.  Plan for steroid injection.  Last injection was about 3 months ago and worked until just recently. Continue conservative management strategies including  Voltaren gel and quad strengthening exercises. Recheck in about 3 months.  At that time certainly can proceed with repeat steroid injection if needed.   PDMP not reviewed this encounter. Orders Placed This Encounter  Procedures   [place] LIMITED JOINT SPACE STRUCTURES LOW BILAT(NO LINKED CHARGES)    Standing Status:   Future    Number of Occurrences:   1    Standing Expiration Date:   07/06/2021    Order Specific Question:   Reason for Exam (SYMPTOM  OR DIAGNOSIS REQUIRED)    Answer:   bilateral knee pain    Order Specific Question:   Preferred imaging location?    Answer:   [place]   No orders of the defined types were placed in this encounter.    Discussed warning signs or symptoms. Please see discharge instructions. Patient expresses understanding.   The above documentation has been reviewed and is accurate and complete Lynne Leader, M.D.

## 2021-01-06 ENCOUNTER — Ambulatory Visit: Payer: Self-pay

## 2021-01-06 ENCOUNTER — Other Ambulatory Visit: Payer: Self-pay

## 2021-01-06 ENCOUNTER — Ambulatory Visit (INDEPENDENT_AMBULATORY_CARE_PROVIDER_SITE_OTHER): Payer: Medicare HMO | Admitting: Family Medicine

## 2021-01-06 VITALS — BP 138/80 | HR 83 | Ht 66.0 in | Wt 194.2 lb

## 2021-01-06 DIAGNOSIS — M25561 Pain in right knee: Secondary | ICD-10-CM | POA: Diagnosis not present

## 2021-01-06 DIAGNOSIS — G8929 Other chronic pain: Secondary | ICD-10-CM | POA: Diagnosis not present

## 2021-01-06 DIAGNOSIS — M170 Bilateral primary osteoarthritis of knee: Secondary | ICD-10-CM

## 2021-01-06 DIAGNOSIS — M25562 Pain in left knee: Secondary | ICD-10-CM

## 2021-01-06 NOTE — Patient Instructions (Signed)
Thank you for coming in today.   You received an injection today. Seek immediate medical attention if the joint becomes red, extremely painful, or is oozing fluid.   We can repeat the injection every 3 months if needed

## 2021-01-13 DIAGNOSIS — M25532 Pain in left wrist: Secondary | ICD-10-CM | POA: Diagnosis not present

## 2021-02-03 ENCOUNTER — Other Ambulatory Visit: Payer: Self-pay

## 2021-02-03 ENCOUNTER — Ambulatory Visit (HOSPITAL_COMMUNITY): Payer: Medicare PPO | Attending: Cardiology

## 2021-02-03 DIAGNOSIS — I5043 Acute on chronic combined systolic (congestive) and diastolic (congestive) heart failure: Secondary | ICD-10-CM | POA: Diagnosis present

## 2021-02-03 LAB — ECHOCARDIOGRAM COMPLETE
Area-P 1/2: 2.48 cm2
S' Lateral: 3.3 cm

## 2021-02-04 ENCOUNTER — Ambulatory Visit: Payer: Medicare HMO | Admitting: Internal Medicine

## 2021-02-05 ENCOUNTER — Ambulatory Visit: Payer: Medicare HMO | Admitting: Internal Medicine

## 2021-02-09 ENCOUNTER — Other Ambulatory Visit: Payer: Self-pay

## 2021-02-09 ENCOUNTER — Ambulatory Visit (INDEPENDENT_AMBULATORY_CARE_PROVIDER_SITE_OTHER): Payer: Medicare PPO | Admitting: Internal Medicine

## 2021-02-09 ENCOUNTER — Encounter: Payer: Self-pay | Admitting: Internal Medicine

## 2021-02-09 VITALS — BP 118/68 | HR 83 | Ht 66.0 in | Wt 186.2 lb

## 2021-02-09 DIAGNOSIS — I10 Essential (primary) hypertension: Secondary | ICD-10-CM

## 2021-02-09 DIAGNOSIS — Z0001 Encounter for general adult medical examination with abnormal findings: Secondary | ICD-10-CM

## 2021-02-09 DIAGNOSIS — R634 Abnormal weight loss: Secondary | ICD-10-CM

## 2021-02-09 DIAGNOSIS — Z1211 Encounter for screening for malignant neoplasm of colon: Secondary | ICD-10-CM | POA: Diagnosis not present

## 2021-02-09 DIAGNOSIS — E1165 Type 2 diabetes mellitus with hyperglycemia: Secondary | ICD-10-CM

## 2021-02-09 DIAGNOSIS — E559 Vitamin D deficiency, unspecified: Secondary | ICD-10-CM | POA: Diagnosis not present

## 2021-02-09 DIAGNOSIS — E538 Deficiency of other specified B group vitamins: Secondary | ICD-10-CM

## 2021-02-09 LAB — CBC WITH DIFFERENTIAL/PLATELET
Basophils Absolute: 0 10*3/uL (ref 0.0–0.1)
Basophils Relative: 0.6 % (ref 0.0–3.0)
Eosinophils Absolute: 0 10*3/uL (ref 0.0–0.7)
Eosinophils Relative: 0.4 % (ref 0.0–5.0)
HCT: 45.6 % (ref 36.0–46.0)
Hemoglobin: 14.4 g/dL (ref 12.0–15.0)
Lymphocytes Relative: 20.8 % (ref 12.0–46.0)
Lymphs Abs: 1.6 10*3/uL (ref 0.7–4.0)
MCHC: 31.6 g/dL (ref 30.0–36.0)
MCV: 88.9 fl (ref 78.0–100.0)
Monocytes Absolute: 0.6 10*3/uL (ref 0.1–1.0)
Monocytes Relative: 7.6 % (ref 3.0–12.0)
Neutro Abs: 5.5 10*3/uL (ref 1.4–7.7)
Neutrophils Relative %: 70.6 % (ref 43.0–77.0)
Platelets: 317 10*3/uL (ref 150.0–400.0)
RBC: 5.13 Mil/uL — ABNORMAL HIGH (ref 3.87–5.11)
RDW: 17.3 % — ABNORMAL HIGH (ref 11.5–15.5)
WBC: 7.8 10*3/uL (ref 4.0–10.5)

## 2021-02-09 LAB — MICROALBUMIN / CREATININE URINE RATIO
Creatinine,U: 136.6 mg/dL
Microalb Creat Ratio: 0.5 mg/g (ref 0.0–30.0)
Microalb, Ur: 0.7 mg/dL (ref 0.0–1.9)

## 2021-02-09 LAB — LIPID PANEL
Cholesterol: 162 mg/dL (ref 0–200)
HDL: 37.9 mg/dL — ABNORMAL LOW (ref >39.00)
LDL Cholesterol: 106 mg/dL — ABNORMAL HIGH (ref 0–99)
NonHDL: 124.47
Total CHOL/HDL Ratio: 4
Triglycerides: 93 mg/dL (ref 0.0–149.0)
VLDL: 18.6 mg/dL (ref 0.0–40.0)

## 2021-02-09 LAB — BASIC METABOLIC PANEL
BUN: 5 mg/dL — ABNORMAL LOW (ref 6–23)
CO2: 33 mEq/L — ABNORMAL HIGH (ref 19–32)
Calcium: 9.2 mg/dL (ref 8.4–10.5)
Chloride: 100 mEq/L (ref 96–112)
Creatinine, Ser: 0.7 mg/dL (ref 0.40–1.20)
GFR: 84.57 mL/min (ref >60.00)
Glucose, Bld: 94 mg/dL (ref 70–99)
Potassium: 3.8 mEq/L (ref 3.5–5.1)
Sodium: 141 mEq/L (ref 135–145)

## 2021-02-09 LAB — HEPATIC FUNCTION PANEL
ALT: 12 U/L (ref 0–35)
AST: 16 U/L (ref 0–37)
Albumin: 3.7 g/dL (ref 3.5–5.2)
Alkaline Phosphatase: 99 U/L (ref 39–117)
Bilirubin, Direct: 0.1 mg/dL (ref 0.0–0.3)
Total Bilirubin: 0.7 mg/dL (ref 0.2–1.2)
Total Protein: 7.6 g/dL (ref 6.0–8.3)

## 2021-02-09 LAB — VITAMIN D 25 HYDROXY (VIT D DEFICIENCY, FRACTURES): VITD: 12.11 ng/mL — ABNORMAL LOW (ref 30.00–100.00)

## 2021-02-09 LAB — HEMOGLOBIN A1C: Hgb A1c MFr Bld: 5.8 % (ref 4.6–6.5)

## 2021-02-09 LAB — VITAMIN B12: Vitamin B-12: 163 pg/mL — ABNORMAL LOW (ref 211–911)

## 2021-02-09 NOTE — Assessment & Plan Note (Signed)
Etiology unclear, exam benign, for cxr 

## 2021-02-09 NOTE — Assessment & Plan Note (Signed)
Lab Results  Component Value Date   HGBA1C 6.1 (H) 10/28/2020   Stable, pt to continue current medical treatment  - diet, for f/u a1c

## 2021-02-09 NOTE — Progress Notes (Signed)
Patient ID: [name], female   DOB: 25-Nov-1945, 75 y.o.   MRN: [id number]

## 2021-02-09 NOTE — Patient Instructions (Signed)
You will be contacted regarding the referral for: colonoscopy  Please continue all other medications as before, and refills have been done if requested.  Please have the pharmacy call with any other refills you may need.  Please continue your efforts at being more active, low cholesterol diet, and weight control.  You are otherwise up to date with prevention measures today.  Please keep your appointments with your specialists as you may have planned  Please go to the XRAY Department in the first floor for the x-ray testing  Please go to the LAB at the blood drawing area for the tests to be done  You will be contacted by phone if any changes need to be made immediately.  Otherwise, you will receive a letter about your results with an explanation, but please check with MyChart first.  Please remember to sign up for MyChart if you have not done so, as this will be important to you in the future with finding out test results, communicating by private email, and scheduling acute appointments online when needed.  Please make an Appointment to return in 6 months, or sooner if needed

## 2021-02-09 NOTE — Assessment & Plan Note (Signed)
Lab Results  Component Value Date   VITAMINB12 195 (L) 03/19/2020   Low, to start oral replacement - b12 1000 mcg qd

## 2021-02-09 NOTE — Assessment & Plan Note (Signed)
Age and sex appropriate education and counseling updated with regular exercise and diet Referrals for preventative services - for colonoscopy Immunizations addressed - none needed Smoking counseling  - none needed Evidence for depression or other mood disorder - none significant Most recent labs reviewed. I have personally reviewed and have noted: 1) the patient's medical and social history 2) The patient's current medications and supplements 3) The patient's height, weight, and BMI have been recorded in the chart  

## 2021-02-09 NOTE — Assessment & Plan Note (Signed)
BP Readings from Last 3 Encounters:  02/09/21 118/68  01/06/21 138/80  12/28/20 120/90   Stable, pt to continue medical treatment tenormin, hct

## 2021-02-09 NOTE — Progress Notes (Signed)
Patient ID: [name], female   DOB: August 09, 1945, 76 y.o.   MRN: [id number]         Chief Complaint:: wellness exam and Follow-up Dm, wt loss, b12 deficiency, htn        HPI:  [name] is a 76 y.o. female here for wellness exam; due for colonoscopy, o/w up to date                        Also Pt denies chest pain, increased sob or doe, wheezing, orthopnea, PND, increased LE swelling, palpitations, dizziness or syncope.   Pt denies polydipsia, polyuria, or new focal neuro s/s.   Pt denies fever, night sweats, loss of appetite, or other constitutional symptoms, but has had unintended wt loss in the past month.  Not taking B12.   Wt Readings from Last 3 Encounters:  02/09/21 186 lb 3.2 oz (84.5 kg)  01/06/21 194 lb 3.2 oz (88.1 kg)  12/28/20 195 lb 6.4 oz (88.6 kg)   BP Readings from Last 3 Encounters:  02/09/21 118/68  01/06/21 138/80  12/28/20 120/90   Immunization History  Administered Date(s) Administered   Fluad Quad(high Dose 65+) 11/15/2019, 09/17/2020   Influenza Split 11/02/2010, 10/20/2011   Influenza Whole 11/23/2005, 10/15/2007, 10/31/2008, 11/11/2009   Influenza, High Dose Seasonal PF 09/23/2016, 10/19/2017   Influenza,inj,Quad PF,6+ Mos 10/18/2012, 09/05/2013, 10/30/2015   Influenza-Unspecified 10/18/2014, 09/11/2018   PFIZER(Purple Top)SARS-COV-2 Vaccination 03/02/2019, 03/27/2019, 11/05/2019, 10/19/2020   Pneumococcal Conjugate-13 10/18/2012, 02/21/2013   Pneumococcal Polysaccharide-23 11/17/2005, 11/09/2010   Tetanus 02/21/2013   Zoster Recombinat (Shingrix) 09/12/2018, 11/16/2018   Zoster, Live 11/23/2005   Health Maintenance Due  Topic Date Due   COLONOSCOPY (Pts 45-44yrs Insurance coverage will need to be confirmed)  04/12/2020   URINE MICROALBUMIN  03/19/2021      Past Medical History:  Diagnosis Date   AAA (abdominal aortic aneurysm)    a. s/p stent graft repair 01/2016.   Acute ischemic colitis ([place]) 07/29/2010   Diagnosed June, 2012  characterized by acute lower GI bleeding, spontaneously resolved.    Acute respiratory infection 06/09/2014   Acute sinus infection 05/14/2015   Allergic rhinitis, cause unspecified    Angioedema of lips 06/03/2014   Anxiety state, unspecified    Arthritis of left hip 04/16/2015   Bilateral hearing loss 07/19/2017   Chronic LBP    Coronary artery disease    a. inferior MI 1998 s/p PCI of RCA. b. stenting of Cx 04/2010. c. DES to prox LAD 05/2013. d. DES x 3 in 10/2015. // Myoview 07/2018:  EF 59, normal perfusion, low risk    Coronary artery disease involving native heart without angina pectoris 07/31/2006   Qualifier: Diagnosis of  By: [name] MD, [name]  ANGIOGRAPHIC DATA:  1. Ventriculography done in the RAO projection reveals a small wall  motion abnormality in the mid inferior wall. Ejection fraction  would be estimated at around 50%.  2. The right coronary artery has some progressive disease of about 60-  80% in the proximal mid segment. The distal vessel appears to be  50-70% narrowing althou   Degeneration of lumbar or lumbosacral intervertebral disc    Depressive disorder, not elsewhere classified    Diabetes ([place]) 11/03/2006   Qualifier: Diagnosis of  By: [name] MD, [name]    Diabetes mellitus    TYPE II   Frequent urination 05/08/2014   Gout 08/22/2013   History of endovascular stent graft for abdominal  aortic aneurysm (AAA) 01/22/2016   Hyperlipidemia    Hypertension    Hypothyroidism    Iron deficiency anemia 06/13/2017   Lower GI bleed 06/2010   Diverticular bleed   Lumbar radiculopathy 05/15/2015   MENOPAUSAL DISORDER 10/29/2007   Qualifier: Diagnosis of  By: [name] MD, [name]    Myalgia 09/25/2013   Noncompliance with medications 02/26/2014   Obesity, unspecified    OTITIS MEDIA, SEROUS, CHRONIC 04/23/2007   Qualifier: Diagnosis of  By: [name] MD, [name]    Thoracic disc disease 11/19/2018   URTICARIA 09/23/2009   Qualifier: Diagnosis of  By: [name] MD, Mateo Flow A    Venous  insufficiency 07/19/2017   Past Surgical History:  Procedure Laterality Date   CARDIAC CATHETERIZATION     PCI OF BOTH THE CIRCUMFLEX AND LEFT ANTERIOR DESCENDING ARTERY   CARDIAC CATHETERIZATION N/A 10/29/2015   Procedure: Coronary Stent Intervention;  Surgeon: Nelva Bush, MD;  Location: [place] [facility];  Service: Cardiovascular;  Laterality: N/A;   CARDIAC CATHETERIZATION N/A 10/29/2015   Procedure: Coronary/Graft Angiography;  Surgeon: Nelva Bush, MD;  Location: [place] [facility];  Service: Cardiovascular;  Laterality: N/A;   CARDIAC CATHETERIZATION N/A 10/29/2015   Procedure: Intravascular Pressure Wire/FFR Study;  Surgeon: Nelva Bush, MD;  Location: [place] [facility];  Service: Cardiovascular;  Laterality: N/A;   CESAREAN SECTION     ENDOVASCULAR STENT INSERTION N/A 01/22/2016   Procedure: ABDOMINAL AORTIC ENDOVASCULAR STENT GRAFT INSERTION;  Surgeon: Rosetta Posner, MD;  Location: [place];  Service: Vascular;  Laterality: N/A;   HEART STENT  04-2010  and  Jun 07, 2013   X 3   LEFT HEART CATH AND CORONARY ANGIOGRAPHY N/A 04/03/2019   Procedure: LEFT HEART CATH AND CORONARY ANGIOGRAPHY;  Surgeon: Nelva Bush, MD;  Location: [place] [facility];  Service: Cardiovascular;  Laterality: N/A;   LEFT HEART CATHETERIZATION WITH CORONARY ANGIOGRAM N/A 06/07/2013   Procedure: LEFT HEART CATHETERIZATION WITH CORONARY ANGIOGRAM;  Surgeon: [name], MD;  Location[facility] LAB;  Service: Cardiovascular;  Laterality: N/A;   LEFT HEART CATHETERIZATION WITH CORONARY ANGIOGRAM N/A 02/25/2014   Procedure: LEFT HEART CATHETERIZATION WITH CORONARY ANGIOGRAM;  Surgeon: Troy Sine, MD;  Location: [facility] LAB;  Service: Cardiovascular;  Laterality: N/A;   LUMBAR FUSION  01/2007   DR. Patrice Paradise...3-LEVEL WITH FIXATION   OOPHORECTOMY     BSO? pt.unsure   PARATHYROIDECTOMY     SPINE SURGERY     THYROIDECTOMY     TOTAL ABDOMINAL HYSTERECTOMY      reports that she quit smoking about  34 years ago. Her smoking use included cigarettes. She has never used smokeless tobacco. She reports that she does not drink alcohol and does not use drugs. family history includes Arthritis in her maternal aunt; Breast cancer in her maternal aunt; Diabetes in her brother and maternal aunt; Heart attack (age of onset: 58) in her mother; Heart attack (age of onset: 67) in her father; Heart disease in her father and mother. Allergies  Allergen Reactions   Crestor [Rosuvastatin Calcium] Other (See Comments)    Feeling poor   Prilosec [Omeprazole] Other (See Comments)    Chest pain   Miconazole Nitrate Hives    REACTION: hives   Augmentin [Amoxicillin-Pot Clavulanate] Hives, Itching and Rash    Has patient had a PCN reaction causing immediate rash, facial/tongue/throat swelling, SOB or lightheadedness with hypotension:unsure Has patient had a PCN reaction causing severe rash involving mucus membranes or skin  necrosis:unsure Has patient had a PCN reaction that required hospitalization:No Has patient had a PCN reaction occurring within the last 10 years:NO If all of the above answers are "NO", then may proceed with Cephalosporin use. Has patient had a PCN reaction causing immediate rash, facial/tongue/throat swelling, SOB or lightheadedness with hypotension:unsure Has patient had a PCN reaction causing severe rash involving mucus membranes or skin necrosis:unsure Has patient had a PCN reaction that required hospitalization:No Has patient had a PCN reaction occurring within the last 10 years:NO If all of the above answers are "NO", then may proceed with Cephalosporin use.    Doxycycline Other (See Comments)    REACTION: gi upset   Current Outpatient Medications on File Prior to Visit  Medication Sig Dispense Refill   allopurinol (ZYLOPRIM) 300 MG tablet Take 1 tablet (300 mg total) by mouth as needed (for Gout). 90 tablet 3   aspirin 81 MG EC tablet Take 81 mg by mouth daily.      atenolol  (TENORMIN) 25 MG tablet Take 1 tablet (25 mg total) by mouth daily. TAKE 1 TABLET TWICE DAILY (Patient taking differently: Take 25 mg by mouth daily.) 180 tablet 0   atorvastatin (LIPITOR) 80 MG tablet Take 1 tablet (80 mg total) by mouth daily.  1   celecoxib (CELEBREX) 200 MG capsule Take 1 capsule (200 mg total) by mouth 2 (two) times daily as needed for moderate pain. 180 capsule 1   furosemide (LASIX) 40 MG tablet Take 1 tablet (40 mg total) by mouth daily. 90 tablet 3   hydrochlorothiazide (MICROZIDE) 12.5 MG capsule Take 1 capsule (12.5 mg total) by mouth daily. 90 capsule 3   nitroGLYCERIN (NITROSTAT) 0.4 MG SL tablet Place 1 tablet (0.4 mg total) under the tongue every 5 (five) minutes as needed for chest pain. 25 tablet 3   oxyCODONE (ROXICODONE) 5 MG immediate release tablet Take 1 tablet (5 mg total) by mouth every 6 (six) hours as needed for severe pain. 30 tablet 0   potassium chloride (KLOR-CON) 10 MEQ tablet Take 1 tablet (10 mEq total) by mouth daily. 90 tablet 3   pregabalin (LYRICA) 75 MG capsule Take 75 mg by mouth daily as needed (pain).      SYNTHROID 150 MCG tablet TAKE 1 TABLET(150 MCG) BY MOUTH DAILY 90 tablet 0   Cholecalciferol (THERA-D 2000) 50 MCG (2000 UT) TABS 1 tab by mouth once daily (Patient not taking: Reported on 12/28/2020) 90 tablet 99   colchicine 0.6 MG tablet Take 1 tablet (0.6 mg total) by mouth daily for 3 days. (Patient not taking: Reported on 12/28/2020) 90 tablet 3   isosorbide mononitrate (IMDUR) 30 MG 24 hr tablet Take 0.5 tablets (15 mg total) by mouth daily. (Patient not taking: Reported on 12/28/2020) 45 tablet 3   pantoprazole (PROTONIX) 40 MG tablet Take 40 mg by mouth daily as needed (heartburn acid reflux). (Patient not taking: Reported on 12/28/2020)     tiZANidine (ZANAFLEX) 2 MG tablet Take 1 tablet (2 mg total) by mouth every 6 (six) hours as needed for muscle spasms. (Patient not taking: Reported on 12/28/2020) 40 tablet 1   triamcinolone  (NASACORT) 55 MCG/ACT AERO nasal inhaler Place 2 sprays into the nose as needed (congestion). (Patient not taking: Reported on 12/28/2020) 1 each 12   No current facility-administered medications on file prior to visit.        ROS:  All others reviewed and negative.  Objective        PE:  BP 118/68 (BP Location: Left Arm, Patient Position: Sitting, Cuff Size: Large)    Pulse 83    Ht 5\' 6"  (1.676 m)    Wt 186 lb 3.2 oz (84.5 kg)    SpO2 100%    BMI 30.05 kg/m                 Constitutional: Pt appears in NAD               HENT: Head: NCAT.                Right Ear: External ear normal.                 Left Ear: External ear normal.                Eyes: . Pupils are equal, round, and reactive to light. Conjunctivae and EOM are normal               Nose: without d/c or deformity               Neck: Neck supple. Gross normal ROM               Cardiovascular: Normal rate and regular rhythm.                 Pulmonary/Chest: Effort normal and breath sounds without rales or wheezing.                Abd:  Soft, NT, ND, + BS, no organomegaly               Neurological: Pt is alert. At baseline orientation, motor grossly intact               Skin: Skin is warm. No rashes, no other new lesions, LE edema - none               Psychiatric: Pt behavior is normal without agitation   Micro: none  Cardiac tracings I have personally interpreted today:  none  Pertinent Radiological findings (summarize): none   Lab Results  Component Value Date   WBC 8.0 10/29/2020   HGB 13.3 10/29/2020   HCT 42.6 10/29/2020   PLT 324 10/29/2020   GLUCOSE 85 12/28/2020   CHOL 143 10/30/2020   TRIG 130 10/30/2020   HDL 25 (L) 10/30/2020   LDLDIRECT 131.0 03/27/2015   LDLCALC 94 10/30/2020   ALT 19 10/30/2020   AST 26 10/30/2020   NA 139 12/28/2020   K 4.6 12/28/2020   CL 100 12/28/2020   CREATININE 0.83 12/28/2020   BUN 3 (L) 12/28/2020   CO2 25 12/28/2020   TSH 2.32 03/19/2020   INR 1.08 01/22/2016    HGBA1C 6.1 (H) 10/28/2020   MICROALBUR 0.8 03/19/2020   Assessment/Plan:  Leyton JONET MATHIES is a 76 y.o. Black or African American [2] female with  has a past medical history of AAA (abdominal aortic aneurysm), Acute ischemic colitis ([place]) (07/29/2010), Acute respiratory infection (06/09/2014), Acute sinus infection (05/14/2015), Allergic rhinitis, cause unspecified, Angioedema of lips (06/03/2014), Anxiety state, unspecified, Arthritis of left hip (04/16/2015), Bilateral hearing loss (07/19/2017), Chronic LBP, Coronary artery disease, Coronary artery disease involving native heart without angina pectoris (07/31/2006), Degeneration of lumbar or lumbosacral intervertebral disc, Depressive disorder, not elsewhere classified, Diabetes ([place]) (11/03/2006), Diabetes mellitus, Frequent urination (05/08/2014), Gout (08/22/2013), History of endovascular stent graft for abdominal aortic aneurysm (AAA) (01/22/2016), Hyperlipidemia, Hypertension, Hypothyroidism, Iron deficiency anemia (06/13/2017), Lower GI bleed (06/2010),  Lumbar radiculopathy (05/15/2015), MENOPAUSAL DISORDER (10/29/2007), Myalgia (09/25/2013), Noncompliance with medications (02/26/2014), Obesity, unspecified, OTITIS MEDIA, SEROUS, CHRONIC (04/23/2007), Thoracic disc disease (11/19/2018), URTICARIA (09/23/2009), and Venous insufficiency (07/19/2017).  Encounter for well adult exam with abnormal findings Age and sex appropriate education and counseling updated with regular exercise and diet Referrals for preventative services - for colonoscopy Immunizations addressed - none needed Smoking counseling  - none needed Evidence for depression or other mood disorder - none significant Most recent labs reviewed. I have personally reviewed and have noted: 1) the patient's medical and social history 2) The patient's current medications and supplements 3) The patient's height, weight, and BMI have been recorded in the chart   B12 deficiency Lab Results  Component Value Date    VITAMINB12 195 (L) 03/19/2020   Low, to start oral replacement - b12 1000 mcg qd   Diabetes ([place]) Lab Results  Component Value Date   HGBA1C 6.1 (H) 10/28/2020   Stable, pt to continue current medical treatment  - diet, for f/u a1c   Essential hypertension BP Readings from Last 3 Encounters:  02/09/21 118/68  01/06/21 138/80  12/28/20 120/90   Stable, pt to continue medical treatment tenormin, hct   Weight loss Etiology unclear, exam benign, for cxr  Followup: Return in about 6 months (around 08/09/2021).  [name], MD 02/09/2021 8:48 PM [facility]

## 2021-02-10 ENCOUNTER — Encounter: Payer: Self-pay | Admitting: Internal Medicine

## 2021-02-10 LAB — URINALYSIS, ROUTINE W REFLEX MICROSCOPIC
Bilirubin Urine: NEGATIVE
Hgb urine dipstick: NEGATIVE
Ketones, ur: NEGATIVE
Leukocytes,Ua: NEGATIVE
Nitrite: NEGATIVE
Specific Gravity, Urine: 1.015 (ref 1.000–1.030)
Total Protein, Urine: NEGATIVE
Urine Glucose: NEGATIVE
Urobilinogen, UA: 1 (ref 0.0–1.0)
pH: 7 (ref 5.0–8.0)

## 2021-02-10 LAB — TSH: TSH: 0.33 u[IU]/mL — ABNORMAL LOW (ref 0.35–5.50)

## 2021-03-23 ENCOUNTER — Other Ambulatory Visit: Payer: Self-pay | Admitting: Internal Medicine

## 2021-03-23 NOTE — Telephone Encounter (Signed)
Please refill as per office routine med refill policy (all routine meds to be refilled for 3 mo or monthly (per pt preference) up to one year from last visit, then month to month grace period for 3 mo, then further med refills will have to be denied) ? ?

## 2021-03-25 DIAGNOSIS — Z961 Presence of intraocular lens: Secondary | ICD-10-CM | POA: Diagnosis not present

## 2021-03-25 DIAGNOSIS — H524 Presbyopia: Secondary | ICD-10-CM | POA: Diagnosis not present

## 2021-03-25 DIAGNOSIS — E119 Type 2 diabetes mellitus without complications: Secondary | ICD-10-CM | POA: Diagnosis not present

## 2021-03-25 DIAGNOSIS — H52202 Unspecified astigmatism, left eye: Secondary | ICD-10-CM | POA: Diagnosis not present

## 2021-03-30 DIAGNOSIS — I5032 Chronic diastolic (congestive) heart failure: Secondary | ICD-10-CM | POA: Insufficient documentation

## 2021-03-30 NOTE — Progress Notes (Deleted)
?Cardiology Office Note:   ? ?Date:  03/30/2021  ? ?ID:  [name], DOB 1945-05-11, MRN [id number] ? ?PCP:  Biagio Borg, MD  ?[facility] Providers ?Cardiologist:  Sherren Mocha, MD ?Cardiology APP:  Liliane Shi, PA-C { ?Click to update primary MD,subspecialty MD or APP then REFRESH:1}  *** ?Referring MD: Biagio Borg, MD  ? ?Chief Complaint:  No chief complaint on file. ?{Click here for Visit Info    :1}  ? ?Patient Profile: ?Coronary artery disease  ?s/p prior inf MI in 1998 tx with stent to the RCA ?S/p stent LCx in 2012 ?S/p stent to the LAD in 2015 ?S/p DES x 3 to the RCA 2017 ?Myoview 07/2018: low risk ?Cath 03/2019: dRCA diff dz'd supplying small/dz'd PDA+PL br (not ideal for PCI) >> med Rx ?Diabetes mellitus  ?Hypertension ?Hyperlipidemia ?AAA ?S/p EVAR 01/2016 ?  ?Prior CV studies: ?Echocardiogram 02/03/21 ?EF 55, inf HK, Gr 1 DD, normal RVSF, AV sclerosis w/o AS ? ?Cardiac catheterization 04/03/2019 ?LM normal ?LAD prox stent ok with 30 ISR, dist 75; D2 75 ?LCx prox 60, mid 60; OM2 stent ok ?RCA ost stent ok, mid 32, dist 23; RPDA w/ severe dz ?EF 55-65 ?  ?Myoview 08/17/2018 ?Normal perfusion; EF 59; Low Risk ?  ?Echocardiogram 02/09/2018 ?EF 50-55, no RWMA, mod MAC ?  ?Echocardiogram 06/13/17 ?EF 55-60, no RWMA, Gr 1 DD, mod LAE, trivial TR, PASP 20, trivial eff ?   ?LHC 10/29/15 ?PCI: 3 x 28 mm Promus, 3 x 16 mm Promus, 3 x 12 mm Promus DES to RCA  ?   ?LHC (06/06/13):   ?PCI:  3.0 x 22 mm Resolute DES to proximal LAD   ? ?*** ?{Select studies to display:26339}  ? ?History of Present Illness:   ?[name] is a 76 y.o. female with the above problem list.  She was last seen by Dr. Burt Knack in 12/22 for worsening leg edema, shortness of breath and fatigue.  She was started on Furosemide.  An echocardiogram was obtained and showed low normal EF and mild diastolic dysfunction.  She returns for f/u.  ***  ?   ?Past Medical History:  ?Diagnosis Date  ? AAA (abdominal aortic aneurysm)   ? a. s/p stent  graft repair 01/2016.  ? Acute ischemic colitis ([place]) 07/29/2010  ? Diagnosed June, 2012 characterized by acute lower GI bleeding, spontaneously resolved.   ? Acute respiratory infection 06/09/2014  ? Acute sinus infection 05/14/2015  ? Allergic rhinitis, cause unspecified   ? Angioedema of lips 06/03/2014  ? Anxiety state, unspecified   ? Arthritis of left hip 04/16/2015  ? Bilateral hearing loss 07/19/2017  ? Chronic LBP   ? Coronary artery disease   ? a. inferior MI 1998 s/p PCI of RCA. b. stenting of Cx 04/2010. c. DES to prox LAD 05/2013. d. DES x 3 in 10/2015. // Myoview 07/2018:  EF 59, normal perfusion, low risk   ? Coronary artery disease involving native heart without angina pectoris 07/31/2006  ? Qualifier: Diagnosis of  By: [name] MD, [name]  ANGIOGRAPHIC DATA:  1. Ventriculography done in the RAO projection reveals a small wall  motion abnormality in the mid inferior wall. Ejection fraction  would be estimated at around 50%.  2. The right coronary artery has some progressive disease of about 60-  80% in the proximal mid segment. The distal vessel appears to be  50-70% narrowing althou  ? Degeneration of lumbar or lumbosacral intervertebral disc   ?  Depressive disorder, not elsewhere classified   ? Diabetes ([place]) 11/03/2006  ? Qualifier: Diagnosis of  By: [name] MD, [name]   ? Diabetes mellitus   ? TYPE II  ? Frequent urination 05/08/2014  ? Gout 08/22/2013  ? History of endovascular stent graft for abdominal aortic aneurysm (AAA) 01/22/2016  ? Hyperlipidemia   ? Hypertension   ? Hypothyroidism   ? Iron deficiency anemia 06/13/2017  ? Lower GI bleed 06/2010  ? Diverticular bleed  ? Lumbar radiculopathy 05/15/2015  ? MENOPAUSAL DISORDER 10/29/2007  ? Qualifier: Diagnosis of  By: [name] MD, [name]   ? Myalgia 09/25/2013  ? Noncompliance with medications 02/26/2014  ? Obesity, unspecified   ? OTITIS MEDIA, SEROUS, CHRONIC 04/23/2007  ? Qualifier: Diagnosis of  By: [name] MD, [name]   ? Thoracic disc disease 11/19/2018  ?  URTICARIA 09/23/2009  ? Qualifier: Diagnosis of  By: [name] MD, [name]   ? Venous insufficiency 07/19/2017  ? ?Current Medications: ?No outpatient medications have been marked as taking for the 03/31/21 encounter (Appointment) with Liliane Shi, PA-C.  ?  ?Allergies:   Crestor [rosuvastatin calcium], Prilosec [omeprazole], Miconazole nitrate, Augmentin [amoxicillin-pot clavulanate], and Doxycycline  ? ?Social History  ? ?Tobacco Use  ? Smoking status: Former  ?  Years: 30.00  ?  Types: Cigarettes  ?  Quit date: 05/31/1986  ?  Years since quitting: 34.8  ? Smokeless tobacco: Never  ?Vaping Use  ? Vaping Use: Never used  ?Substance Use Topics  ? Alcohol use: No  ? Drug use: No  ?  ?Family Hx: ?The patient's family history includes Arthritis in her maternal aunt; Breast cancer in her maternal aunt; Diabetes in her brother and maternal aunt; Heart attack (age of onset: 27) in her mother; Heart attack (age of onset: 28) in her father; Heart disease in her father and mother. There is no history of Colon cancer. ? ?ROS  ? ?EKGs/Labs/Other Test Reviewed:   ? ?EKG:  EKG is *** ordered today.  The ekg ordered today demonstrates *** ? ?Recent Labs: ?12/28/2020: NT-Pro BNP 73 ?02/09/2021: ALT 12; BUN 5; Creatinine, Ser 0.70; Hemoglobin 14.4; Platelets 317.0; Potassium 3.8; Sodium 141; TSH 0.33  ? ?Recent Lipid Panel ?Recent Labs  ?  02/09/21 ?1514  ?CHOL 162  ?TRIG 93.0  ?HDL 37.90*  ?VLDL 18.6  ?LDLCALC 106*  ?  ? ?Risk Assessment/Calculations:   ?{Does this patient have ATRIAL FIBRILLATION?[phone]} ?    ?Physical Exam:   ? ?VS:  There were no vitals taken for this visit.   ? ?Wt Readings from Last 3 Encounters:  ?02/09/21 186 lb 3.2 oz (84.5 kg)  ?01/06/21 194 lb 3.2 oz (88.1 kg)  ?12/28/20 195 lb 6.4 oz (88.6 kg)  ?  ?Physical Exam *** ?    ?ASSESSMENT & PLAN:   ?No problem-specific Assessment & Plan notes found for this encounter. ?*** ?1. Coronary artery disease of native artery of native heart with stable angina  pectoris ([place]) ?Prior history of stenting to the LAD, LCx and RCA.  Cardiac catheterization in March 2021 demonstrated diffuse distal RCA disease leading into a severely diseased small PDA.  Medical therapy has been recommended.    She continues to have occasional symptoms of chest discomfort.  When she was seen in the hospital in 3/21, she was prescribed isosorbide 15 mg daily.  I have seen her several times since then.  She has the medication at home but has not tried to take it.  I have asked her several times to try to take this medication.  I have asked her again today to start on isosorbide 15 mg daily.  I offered her stress testing today.  We discussed the risks and benefits.  She would like to hold off on proceeding with stress testing at this time.  I have asked her to follow-up sooner if she has any worsening symptoms.  Continue current dose of aspirin, atenolol. ?  ?2. Mixed hyperlipidemia ?Her lipids are uncontrolled.  She has not been taking atorvastatin or ezetimibe.  We checked into getting her approval for Inclisiran.  However, this is too expensive.  I have asked her again to resume atorvastatin and ezetimibe.  Obtain follow-up CMET, lipids in 3 months. ?  ?3. Essential hypertension ?The patient's blood pressure is controlled on her current regimen.  Continue current therapy.  ?  ?4. AAA (abdominal aortic aneurysm) without rupture ([place]) ?Status post endovascular repair.  She is due for follow-up with vascular surgery later this year. ?     ?{Are you ordering a CV Procedure (e.g. stress test, cath, DCCV, TEE, etc)?   Press F2        :[id number]}  ?Dispo:  No follow-ups on file.  ? ?Medication Adjustments/Labs and Tests Ordered: ?Current medicines are reviewed at length with the patient today.  Concerns regarding medicines are outlined above.  ?Tests Ordered: ?No orders of the defined types were placed in this encounter. ? ?Medication Changes: ?No orders of the defined types were placed in this  encounter. ? ?Signed, ?Richardson Dopp, PA-C  ?03/30/2021 11:17 PM    ?[facility] ?[address], [place], [place]  [address] ?Phone: [phone]; Fax: [phone]  ?

## 2021-03-31 ENCOUNTER — Ambulatory Visit: Payer: Medicare PPO | Admitting: Physician Assistant

## 2021-03-31 DIAGNOSIS — I25118 Atherosclerotic heart disease of native coronary artery with other forms of angina pectoris: Secondary | ICD-10-CM

## 2021-03-31 DIAGNOSIS — I5032 Chronic diastolic (congestive) heart failure: Secondary | ICD-10-CM

## 2021-03-31 DIAGNOSIS — I10 Essential (primary) hypertension: Secondary | ICD-10-CM

## 2021-03-31 DIAGNOSIS — I714 Abdominal aortic aneurysm, without rupture, unspecified: Secondary | ICD-10-CM

## 2021-03-31 DIAGNOSIS — E782 Mixed hyperlipidemia: Secondary | ICD-10-CM

## 2021-04-07 ENCOUNTER — Ambulatory Visit: Payer: Medicare PPO | Admitting: Family Medicine

## 2021-04-07 NOTE — Progress Notes (Deleted)
? ?  I, [name], LAT, ATC acting as a scribe for Lynne Leader, MD. ? ?Mailani BENISHA HADAWAY is a 76 y.o. female who presents to [place] [facility] today for cont chronic bilat knee pain due to severe DJD. Pt was last seen by Dr. Georgina Snell on 01/06/21 and was given repeat bilat knee steroid injections and was advised to cont conservative management strategies including Voltaren gel and quad strengthening exercises. Today, pt reports ? ?Dx imaging: 11/21/19 R & L knee XR ? ?Pertinent review of systems: *** ? ?Relevant historical information: *** ? ? ?Exam:  ?There were no vitals taken for this visit. ?General: Well Developed, well nourished, and in no acute distress.  ? ?MSK: *** ? ? ? ?Lab and Radiology Results ?No results found for this or any previous visit (from the past 72 hour(s)). ?No results found. ? ? ? ? ?Assessment and Plan: ?76 y.o. female with *** ? ? ?PDMP not reviewed this encounter. ?No orders of the defined types were placed in this encounter. ? ?No orders of the defined types were placed in this encounter. ? ? ? ?Discussed warning signs or symptoms. Please see discharge instructions. Patient expresses understanding. ? ? ?*** ? ?

## 2021-04-13 ENCOUNTER — Ambulatory Visit: Payer: Medicare PPO | Admitting: Family Medicine

## 2021-04-13 NOTE — Progress Notes (Deleted)
? ?  I, [name], LAT, ATC acting as a scribe for Lynne Leader, MD. ? ?Jamell ALLISYN KUNZ is a 76 y.o. female who presents to [place] [facility] today for her 3 month f/u of DJD and lose bodies of bilat knees. Pt was last seen by Dr. Georgina Snell on 01/06/21 and was given bilat knee steroid injections and advised to continue using Voltaren gel and working on quad strengthening. Today, pt reports ? ?Dx imaging: 11/21/19 R & L knee XR ? ?Pertinent review of systems: *** ? ?Relevant historical information: *** ? ? ?Exam:  ?There were no vitals taken for this visit. ?General: Well Developed, well nourished, and in no acute distress.  ? ?MSK: *** ? ? ? ?Lab and Radiology Results ?No results found for this or any previous visit (from the past 72 hour(s)). ?No results found. ? ? ? ? ?Assessment and Plan: ?76 y.o. female with *** ? ? ?PDMP not reviewed this encounter. ?No orders of the defined types were placed in this encounter. ? ?No orders of the defined types were placed in this encounter. ? ? ? ?Discussed warning signs or symptoms. Please see discharge instructions. Patient expresses understanding. ? ? ?*** ? ?

## 2021-04-22 ENCOUNTER — Ambulatory Visit (INDEPENDENT_AMBULATORY_CARE_PROVIDER_SITE_OTHER): Payer: Medicare PPO | Admitting: Internal Medicine

## 2021-04-22 ENCOUNTER — Encounter: Payer: Self-pay | Admitting: Internal Medicine

## 2021-04-22 VITALS — BP 110/72 | HR 81 | Temp 99.2°F | Ht 66.0 in | Wt 186.0 lb

## 2021-04-22 DIAGNOSIS — M1990 Unspecified osteoarthritis, unspecified site: Secondary | ICD-10-CM

## 2021-04-22 DIAGNOSIS — E538 Deficiency of other specified B group vitamins: Secondary | ICD-10-CM | POA: Diagnosis not present

## 2021-04-22 DIAGNOSIS — F5101 Primary insomnia: Secondary | ICD-10-CM | POA: Diagnosis not present

## 2021-04-22 DIAGNOSIS — E559 Vitamin D deficiency, unspecified: Secondary | ICD-10-CM

## 2021-04-22 DIAGNOSIS — M79673 Pain in unspecified foot: Secondary | ICD-10-CM

## 2021-04-22 DIAGNOSIS — E1165 Type 2 diabetes mellitus with hyperglycemia: Secondary | ICD-10-CM

## 2021-04-22 DIAGNOSIS — M7989 Other specified soft tissue disorders: Secondary | ICD-10-CM

## 2021-04-22 LAB — POCT GLYCOSYLATED HEMOGLOBIN (HGB A1C): Hemoglobin A1C: 5.2 % (ref 4.0–5.6)

## 2021-04-22 MED ORDER — KETOROLAC TROMETHAMINE 30 MG/ML IJ SOLN
30.0000 mg | Freq: Once | INTRAMUSCULAR | Status: AC
  Administered 2021-04-22: 30 mg via INTRAMUSCULAR

## 2021-04-22 MED ORDER — GABAPENTIN 300 MG PO CAPS: ORAL_CAPSULE | ORAL | 1 refills | Status: DC

## 2021-04-22 MED ORDER — FUROSEMIDE 40 MG PO TABS: 40.0000 mg | ORAL_TABLET | Freq: Every day | ORAL | 3 refills | Status: DC

## 2021-04-22 NOTE — Progress Notes (Signed)
? ?    Chief Complaint: follow up bilateral leg swelling x 5 days ? ?     HPI:  [name] is a 76 y.o. female here having become somewhat confused about her meds and stopped taking the lasix approx 2 wks ago, now with 5 days onset gradually worsening feet then leg swelling now up to the knees; Pt denies chest pain, increased sob or doe, wheezing, orthopnea, PND, palpitations, dizziness or syncope. Has ongoing arthritic pain, now worse to hands in the past wk but not gout this time, mild to mod, sharp and achy, intermittent, worse to use hands, better to rest.  Pain makes difficult to get to sleep, asking for further help with this.   Pt denies polydipsia, polyuria, or new focal neuro s/s.    Pt denies fever, wt loss, night sweats, loss of appetite, or other constitutional symptoms    ?      ?Wt Readings from Last 3 Encounters:  ?04/22/21 186 lb (84.4 kg)  ?02/09/21 186 lb 3.2 oz (84.5 kg)  ?01/06/21 194 lb 3.2 oz (88.1 kg)  ? ?BP Readings from Last 3 Encounters:  ?04/22/21 110/72  ?02/09/21 118/68  ?01/06/21 138/80  ? ?      ?Past Medical History:  ?Diagnosis Date  ? AAA (abdominal aortic aneurysm) ([place])   ? a. s/p stent graft repair 01/2016.  ? Acute ischemic colitis ([place]) 07/29/2010  ? Diagnosed June, 2012 characterized by acute lower GI bleeding, spontaneously resolved.   ? Acute respiratory infection 06/09/2014  ? Acute sinus infection 05/14/2015  ? Allergic rhinitis, cause unspecified   ? Angioedema of lips 06/03/2014  ? Anxiety state, unspecified   ? Arthritis of left hip 04/16/2015  ? Bilateral hearing loss 07/19/2017  ? Chronic LBP   ? Coronary artery disease   ? a. inferior MI 1998 s/p PCI of RCA. b. stenting of Cx 04/2010. c. DES to prox LAD 05/2013. d. DES x 3 in 10/2015. // Myoview 07/2018:  EF 59, normal perfusion, low risk   ? Coronary artery disease involving native heart without angina pectoris 07/31/2006  ? Qualifier: Diagnosis of  By: [name] MD, [name]  ANGIOGRAPHIC DATA:  1. Ventriculography done in  the RAO projection reveals a small wall  motion abnormality in the mid inferior wall. Ejection fraction  would be estimated at around 50%.  2. The right coronary artery has some progressive disease of about 60-  80% in the proximal mid segment. The distal vessel appears to be  50-70% narrowing althou  ? Degeneration of lumbar or lumbosacral intervertebral disc   ? Depressive disorder, not elsewhere classified   ? Diabetes ([place]) 11/03/2006  ? Qualifier: Diagnosis of  By: [name] MD, [name]   ? Diabetes mellitus   ? TYPE II  ? Frequent urination 05/08/2014  ? Gout 08/22/2013  ? History of endovascular stent graft for abdominal aortic aneurysm (AAA) 01/22/2016  ? Hyperlipidemia   ? Hypertension   ? Hypothyroidism   ? Iron deficiency anemia 06/13/2017  ? Lower GI bleed 06/2010  ? Diverticular bleed  ? Lumbar radiculopathy 05/15/2015  ? MENOPAUSAL DISORDER 10/29/2007  ? Qualifier: Diagnosis of  By: [name] MD, [name]   ? Myalgia 09/25/2013  ? Noncompliance with medications 02/26/2014  ? Obesity, unspecified   ? OTITIS MEDIA, SEROUS, CHRONIC 04/23/2007  ? Qualifier: Diagnosis of  By: [name] MD, [name]   ? Thoracic disc disease 11/19/2018  ? URTICARIA 09/23/2009  ? Qualifier: Diagnosis  of  By: [name] MD, [name]   ? Venous insufficiency 07/19/2017  ? ?Past Surgical History:  ?Procedure Laterality Date  ? CARDIAC CATHETERIZATION    ? PCI OF BOTH THE CIRCUMFLEX AND LEFT ANTERIOR DESCENDING ARTERY  ? CARDIAC CATHETERIZATION N/A 10/29/2015  ? Procedure: Coronary Stent Intervention;  Surgeon: Nelva Bush, MD;  Location: [facility];  Service: Cardiovascular;  Laterality: N/A;  ? CARDIAC CATHETERIZATION N/A 10/29/2015  ? Procedure: Coronary/Graft Angiography;  Surgeon: Nelva Bush, MD;  Location: [facility];  Service: Cardiovascular;  Laterality: N/A;  ? CARDIAC CATHETERIZATION N/A 10/29/2015  ? Procedure: Intravascular Pressure Wire/FFR Study;  Surgeon: Nelva Bush, MD;  Location: [facility];  Service:  Cardiovascular;  Laterality: N/A;  ? CESAREAN SECTION    ? ENDOVASCULAR STENT INSERTION N/A 01/22/2016  ? Procedure: ABDOMINAL AORTIC ENDOVASCULAR STENT GRAFT INSERTION;  Surgeon: Rosetta Posner, MD;  Location: [facility];  Service: Vascular;  Laterality: N/A;  ? HEART STENT  04-2010  and  Jun 07, 2013  ? X 3  ? LEFT HEART CATH AND CORONARY ANGIOGRAPHY N/A 04/03/2019  ? Procedure: LEFT HEART CATH AND CORONARY ANGIOGRAPHY;  Surgeon: Nelva Bush, MD;  Location: [facility];  Service: Cardiovascular;  Laterality: N/A;  ? LEFT HEART CATHETERIZATION WITH CORONARY ANGIOGRAM N/A 06/07/2013  ? Procedure: LEFT HEART CATHETERIZATION WITH CORONARY ANGIOGRAM;  Surgeon: [name], MD;  Location: [facility] C[facility] LAB;  Service: Cardiovascular;  Laterality: N/A;  ? LEFT HEART CATHETERIZATION WITH CORONARY ANGIOGRAM N/A 02/25/2014  ? Procedure: LEFT HEART CATHETERIZATION WITH CORONARY ANGIOGRAM;  Surgeon: Troy Sine, MD;  Location: [facility] C[facility];  Service: Cardiovascular;  Laterality: N/A;  ? LUMBAR FUSION  01/2007  ? DR. Patrice Paradise...3-LEVEL WITH FIXATION  ? OOPHORECTOMY    ? BSO? pt.unsure  ? PARATHYROIDECTOMY    ? SPINE SURGERY    ? THYROIDECTOMY    ? TOTAL ABDOMINAL HYSTERECTOMY    ? ? reports that she quit smoking about 34 years ago. Her smoking use included cigarettes. She has never used smokeless tobacco. She reports that she does not drink alcohol and does not use drugs. ?family history includes Arthritis in her maternal aunt; Breast cancer in her maternal aunt; Diabetes in her brother and maternal aunt; Heart attack (age of onset: 103) in her mother; Heart attack (age of onset: 34) in her father; Heart disease in her father and mother. ?Allergies  ?Allergen Reactions  ? Crestor [Rosuvastatin Calcium] Other (See Comments)  ?  Feeling poor  ? Prilosec [Omeprazole] Other (See Comments)  ?  Chest pain  ? Miconazole Nitrate Hives  ?  REACTION: hives  ? Augmentin [Amoxicillin-Pot Clavulanate] Hives, Itching and Rash  ?  Has patient  had a PCN reaction causing immediate rash, facial/tongue/throat swelling, SOB or lightheadedness with hypotension:unsure ?Has patient had a PCN reaction causing severe rash involving mucus membranes or skin necrosis:unsure ?Has patient had a PCN reaction that required hospitalization:No ?Has patient had a PCN reaction occurring within the last 10 years:NO ?If all of the above answers are "NO", then may proceed with Cephalosporin use. ?Has patient had a PCN reaction causing immediate rash, facial/tongue/throat swelling, SOB or lightheadedness with hypotension:unsure ?Has patient had a PCN reaction causing severe rash involving mucus membranes or skin necrosis:unsure ?Has patient had a PCN reaction that required hospitalization:No ?Has patient had a PCN reaction occurring within the last 10 years:NO ?If all of the above answers are "NO", then may proceed with Cephalosporin use. ?  ?  Doxycycline Other (See Comments)  ?  REACTION: gi upset  ? ?Current Outpatient Medications on File Prior to Visit  ?Medication Sig Dispense Refill  ? aspirin 81 MG EC tablet Take 81 mg by mouth daily.     ? atenolol (TENORMIN) 25 MG tablet Take 1 tablet (25 mg total) by mouth daily. TAKE 1 TABLET TWICE DAILY (Patient taking differently: Take 25 mg by mouth daily.) 180 tablet 0  ? atorvastatin (LIPITOR) 80 MG tablet Take 1 tablet (80 mg total) by mouth daily.  1  ? celecoxib (CELEBREX) 200 MG capsule Take 1 capsule (200 mg total) by mouth 2 (two) times daily as needed for moderate pain. 180 capsule 1  ? nitroGLYCERIN (NITROSTAT) 0.4 MG SL tablet Place 1 tablet (0.4 mg total) under the tongue every 5 (five) minutes as needed for chest pain. 25 tablet 3  ? oxyCODONE (ROXICODONE) 5 MG immediate release tablet Take 1 tablet (5 mg total) by mouth every 6 (six) hours as needed for severe pain. 30 tablet 0  ? potassium chloride (KLOR-CON) 10 MEQ tablet Take 1 tablet (10 mEq total) by mouth daily. 90 tablet 3  ? pregabalin (LYRICA) 75 MG capsule  Take 75 mg by mouth daily as needed (pain).     ? SYNTHROID 150 MCG tablet TAKE 1 TABLET(150 MCG) BY MOUTH DAILY 90 tablet 3  ? allopurinol (ZYLOPRIM) 300 MG tablet Take 1 tablet (300 mg total) by mouth as

## 2021-04-22 NOTE — Patient Instructions (Signed)
You had the pain shot today (toradol) ? ?Ok to restart the furosemide (lasix) as this will control the leg swelling ? ?Please take all new medication as prescribed - the gabapentin for pain and sleep ? ?Your A1c was done today ? ?Please continue all other medications as before, and refills have been done if requested. ? ?Please have the pharmacy call with any other refills you may need. ? ?Please continue your efforts at being more active, low cholesterol diet, and weight control. ? ?Please keep your appointments with your specialists as you may have planned ? ?Please make an Appointment to return in 3 months, or sooner if needed ?

## 2021-04-23 DIAGNOSIS — M1990 Unspecified osteoarthritis, unspecified site: Secondary | ICD-10-CM | POA: Insufficient documentation

## 2021-04-23 NOTE — Assessment & Plan Note (Signed)
Lab Results  ?Component Value Date  ? HGBA1C 5.2 04/22/2021  ? ?Stable, pt to continue current medical treatment  - diet ? ?

## 2021-04-23 NOTE — Assessment & Plan Note (Signed)
Last vitamin D ?Lab Results  ?Component Value Date  ? VD25OH 12.11 (L) 02/09/2021  ? ?Low, to start oral replacement ? ?

## 2021-04-23 NOTE — Assessment & Plan Note (Signed)
Lab Results  ?Component Value Date  ? VITAMINB12 163 (L) 02/09/2021  ? ?Low, to start oral replacement - b12 1000 mcg qd ? ?

## 2021-04-23 NOTE — Assessment & Plan Note (Signed)
For toradol 30 mg im,  to f/u any worsening symptoms or concerns ?

## 2021-04-23 NOTE — Assessment & Plan Note (Signed)
C/w acute diast chf - for lasix restart, pt educated ?

## 2021-04-23 NOTE — Assessment & Plan Note (Signed)
With recent worsening, for gabapentin 300 - 1-2 qhs prn ?

## 2021-06-01 ENCOUNTER — Ambulatory Visit (INDEPENDENT_AMBULATORY_CARE_PROVIDER_SITE_OTHER): Payer: Medicare PPO | Admitting: Physician Assistant

## 2021-06-01 ENCOUNTER — Encounter: Payer: Self-pay | Admitting: Physician Assistant

## 2021-06-01 VITALS — BP 110/50 | HR 80 | Ht 66.0 in | Wt 188.6 lb

## 2021-06-01 DIAGNOSIS — I714 Abdominal aortic aneurysm, without rupture, unspecified: Secondary | ICD-10-CM | POA: Diagnosis not present

## 2021-06-01 DIAGNOSIS — R072 Precordial pain: Secondary | ICD-10-CM | POA: Diagnosis not present

## 2021-06-01 DIAGNOSIS — I10 Essential (primary) hypertension: Secondary | ICD-10-CM

## 2021-06-01 DIAGNOSIS — R600 Localized edema: Secondary | ICD-10-CM

## 2021-06-01 DIAGNOSIS — E782 Mixed hyperlipidemia: Secondary | ICD-10-CM | POA: Diagnosis not present

## 2021-06-01 DIAGNOSIS — M5416 Radiculopathy, lumbar region: Secondary | ICD-10-CM | POA: Diagnosis not present

## 2021-06-01 DIAGNOSIS — I5032 Chronic diastolic (congestive) heart failure: Secondary | ICD-10-CM

## 2021-06-01 DIAGNOSIS — I25119 Atherosclerotic heart disease of native coronary artery with unspecified angina pectoris: Secondary | ICD-10-CM

## 2021-06-01 DIAGNOSIS — R0602 Shortness of breath: Secondary | ICD-10-CM | POA: Diagnosis not present

## 2021-06-01 NOTE — Progress Notes (Signed)
?Cardiology Office Note:   ? ?Date:  06/01/2021  ? ?ID:  [name], DOB [birth year], MRN [id number] ? ?PCP:  Biagio Borg, MD  ?[facility] Providers ?Cardiologist:  Sherren Mocha, MD ?Cardiology APP:  Liliane Shi, PA-C     ?Referring MD: Biagio Borg, MD  ? ?Chief Complaint:  F/u for CAD ?  ? ?Patient Profile: ?Coronary artery disease  ?s/p prior inf MI in 1998 tx with stent to the RCA ?S/p stent LCx in 2012 ?S/p stent to the LAD in 2015 ?S/p DES x 3 to the RCA 2017 ?Myoview 07/2018: low risk ?Cath 03/2019: dRCA diff dz'd supplying small/dz'd PDA+PL br (not ideal for PCI) >> med Rx ?(HFpEF) heart failure with preserved ejection fraction  ?Diabetes mellitus  ?Hypertension ?Hyperlipidemia ?AAA ?S/p EVAR 01/2016 ? ?Prior CV Studies: ?ECHO COMPLETE WO IMAGING ENHANCING AGENT 02/03/2021 ?EF 55, inf HK, Gr 1 DD, normal RVSF ?   ?Cardiac catheterization 04/03/2019 ?LM normal ?LAD prox stent ok with 30 ISR, dist 75; D2 75 ?LCx prox 60, mid 60; OM2 stent ok ?RCA ost stent ok, mid 16, dist 58; RPDA w/ severe dz ?EF 55-65 ?  ?Myoview 08/17/2018 ?Normal perfusion; EF 59; Low Risk ?  ?Echocardiogram 02/09/2018 ?EF 50-55, no RWMA, mod MAC ?  ? ?History of Present Illness:   ?[name] is a 76 y.o. female with the above problem list.  She was last seen by Dr. Burt Knack in 12/2020.  Furosemide was added at that time due to volume excess.  F/u echocardiogram showed normal EF with mild diastolic dysfunction.  She returns for f/u.  She is here alone.  She notes her L leg has been weak and looks smaller.  She has fallen a couple of times.  She hit her chest when she fell 2 days ago.  Her chest is now sore.  She has been short of breath at times.  She is not active at all.  She has had several back surgeries in the past.  She has not had syncope.  She sleeps on 2 pillows and has done so for years.  Her R leg is very swollen.  She just has some ankle edema on the R.  She only takes the Lasix once or twice a week.      ?   ?Past  Medical History:  ?Diagnosis Date  ? AAA (abdominal aortic aneurysm) ([place])   ? a. s/p stent graft repair 01/2016.  ? Acute ischemic colitis ([place]) 07/29/2010  ? Diagnosed June, 2012 characterized by acute lower GI bleeding, spontaneously resolved.   ? Acute respiratory infection 06/09/2014  ? Acute sinus infection 05/14/2015  ? Allergic rhinitis, cause unspecified   ? Angioedema of lips 06/03/2014  ? Anxiety state, unspecified   ? Arthritis of left hip 04/16/2015  ? Bilateral hearing loss 07/19/2017  ? Chronic LBP   ? Coronary artery disease   ? a. inferior MI 1998 s/p PCI of RCA. b. stenting of Cx 04/2010. c. DES to prox LAD 05/2013. d. DES x 3 in 10/2015. // Myoview 07/2018:  EF 59, normal perfusion, low risk   ? Coronary artery disease involving native heart without angina pectoris 07/31/2006  ? Qualifier: Diagnosis of  By: [name] MD, [name]  ANGIOGRAPHIC DATA:  1. Ventriculography done in the RAO projection reveals a small wall  motion abnormality in the mid inferior wall. Ejection fraction  would be estimated at around 50%.  2. The right coronary artery has some  progressive disease of about 60-  80% in the proximal mid segment. The distal vessel appears to be  50-70% narrowing althou  ? Degeneration of lumbar or lumbosacral intervertebral disc   ? Depressive disorder, not elsewhere classified   ? Diabetes ([place]) 11/03/2006  ? Qualifier: Diagnosis of  By: [name] MD, [name]   ? Diabetes mellitus   ? TYPE II  ? Frequent urination 05/08/2014  ? Gout 08/22/2013  ? History of endovascular stent graft for abdominal aortic aneurysm (AAA) 01/22/2016  ? Hyperlipidemia   ? Hypertension   ? Hypothyroidism   ? Iron deficiency anemia 06/13/2017  ? Lower GI bleed 06/2010  ? Diverticular bleed  ? Lumbar radiculopathy 05/15/2015  ? MENOPAUSAL DISORDER 10/29/2007  ? Qualifier: Diagnosis of  By: [name] MD, [name]   ? Myalgia 09/25/2013  ? Noncompliance with medications 02/26/2014  ? Obesity, unspecified   ? OTITIS MEDIA, SEROUS, CHRONIC 04/23/2007  ?  Qualifier: Diagnosis of  By: [name] MD, [name]   ? Thoracic disc disease 11/19/2018  ? URTICARIA 09/23/2009  ? Qualifier: Diagnosis of  By: [name] MD, [name]   ? Venous insufficiency 07/19/2017  ? ?Current Medications: ?Current Meds  ?Medication Sig  ? allopurinol (ZYLOPRIM) 300 MG tablet Take 1 tablet (300 mg total) by mouth as needed (for Gout).  ? aspirin 81 MG EC tablet Take 81 mg by mouth daily.   ? atenolol (TENORMIN) 25 MG tablet Take 1 tablet by mouth daily.  ? atorvastatin (LIPITOR) 80 MG tablet Take 1 tablet (80 mg total) by mouth daily.  ? celecoxib (CELEBREX) 200 MG capsule Take 1 capsule (200 mg total) by mouth 2 (two) times daily as needed for moderate pain.  ? Cholecalciferol (THERA-D 2000) 50 MCG (2000 UT) TABS 1 tab by mouth once daily  ? furosemide (LASIX) 40 MG tablet Take 1 tablet (40 mg total) by mouth daily.  ? gabapentin (NEURONTIN) 300 MG capsule Take 1-2 tab by mouth at bedtime for pain and sleep  ? nitroGLYCERIN (NITROSTAT) 0.4 MG SL tablet Place 1 tablet (0.4 mg total) under the tongue every 5 (five) minutes as needed for chest pain.  ? oxyCODONE (ROXICODONE) 5 MG immediate release tablet Take 1 tablet (5 mg total) by mouth every 6 (six) hours as needed for severe pain.  ? pantoprazole (PROTONIX) 40 MG tablet Take 40 mg by mouth daily as needed (heartburn acid reflux).  ? potassium chloride (KLOR-CON) 10 MEQ tablet Take 1 tablet (10 mEq total) by mouth daily.  ? pregabalin (LYRICA) 75 MG capsule Take 75 mg by mouth daily as needed (pain).   ? SYNTHROID 150 MCG tablet TAKE 1 TABLET(150 MCG) BY MOUTH DAILY  ? tiZANidine (ZANAFLEX) 2 MG tablet Take 1 tablet (2 mg total) by mouth every 6 (six) hours as needed for muscle spasms.  ? triamcinolone (NASACORT) 55 MCG/ACT AERO nasal inhaler Place 2 sprays into the nose as needed (congestion).  ?  ?Allergies:   Crestor [rosuvastatin calcium], Prilosec [omeprazole], Miconazole nitrate, Augmentin [amoxicillin-pot clavulanate], and Doxycycline   ? ?Social History  ? ?Tobacco Use  ? Smoking status: Former  ?  Years: 30.00  ?  Types: Cigarettes  ?  Quit date: 05/31/1986  ?  Years since quitting: 35.0  ? Smokeless tobacco: Never  ?Vaping Use  ? Vaping Use: Never used  ?Substance Use Topics  ? Alcohol use: No  ? Drug use: No  ?  ?Family Hx: ?The patient's family history includes Arthritis in her  maternal aunt; Breast cancer in her maternal aunt; Diabetes in her brother and maternal aunt; Heart attack (age of onset: 51) in her mother; Heart attack (age of onset: 49) in her father; Heart disease in her father and mother. There is no history of Colon cancer. ? ?Review of Systems  ?Constitutional: Negative for fever.  ?Respiratory:  Positive for cough (clear sputum).   ?Gastrointestinal:  Negative for hematochezia.  ?Genitourinary:  Negative for hematuria.   ? ?EKGs/Labs/Other Test Reviewed:   ? ?EKG:  EKG is  ordered today.  The ekg ordered today demonstrates NSR, HR 66, left axis deviation, right bundle branch block, QTc 461, no change from prior tracing ? ?Recent Labs: ?12/28/2020: NT-Pro BNP 73 ?02/09/2021: ALT 12; BUN 5; Creatinine, Ser 0.70; Hemoglobin 14.4; Platelets 317.0; Potassium 3.8; Sodium 141; TSH 0.33  ? ?Recent Lipid Panel ?Recent Labs  ?  02/09/21 ?1514  ?CHOL 162  ?TRIG 93.0  ?HDL 37.90*  ?VLDL 18.6  ?LDLCALC 106*  ?  ? ?Risk Assessment/Calculations:   ?  ?    ?Physical Exam:   ? ?VS:  BP (!) 110/50   Pulse 80   Ht _0  (1.676 m)   Wt 188 lb 9.6 oz (85.5 kg)   SpO2 96%   BMI 30.44 kg/m?    ? ?Wt Readings from Last 3 Encounters:  ?06/01/21 188 lb 9.6 oz (85.5 kg)  ?04/22/21 186 lb (84.4 kg)  ?02/09/21 186 lb 3.2 oz (84.5 kg)  ?  ?Constitutional:   ?   Appearance: Healthy appearance. Not in distress.  ?Neck:  ?   Vascular: No JVR. JVD normal.  ?Pulmonary:  ?   Effort: Pulmonary effort is normal.  ?   Breath sounds: No wheezing. No rales.  ?Cardiovascular:  ?   Normal rate. Regular rhythm. Normal S1. Normal S2.   ?   Murmurs: There is no murmur.   ?Edema: ?   Peripheral edema present. ?   Pretibial: trace edema of the left pretibial area and 2+ edema of the right pretibial area. ?   Ankle: 1+ edema of the left ankle and 2+ edema of the right ankle. ?Abdomina

## 2021-06-01 NOTE — Assessment & Plan Note (Signed)
LDL in January 2023 was 106.  She notes that she has not been taking atorvastatin.  I have asked her to resume atorvastatin 80 mg daily.  At follow-up, if she is taking atorvastatin on a regular basis, arrange follow-up lipids and LFTs. ?

## 2021-06-01 NOTE — Patient Instructions (Signed)
Medication Instructions:  ?Your physician has recommended you make the following change in your medication:  ? RESTART Atorvastatin 80 mg daily ? YOU need to take the Furosemide everyday along with the Potassium  ? ?*If you need a refill on your cardiac medications before your next appointment, please call your pharmacy* ? ? ?Lab Work: ?TODAY:  BMET & PRO BNP ? ?1 WEEK:  BMET ? ?If you have labs (blood work) drawn today and your tests are completely normal, you will receive your results only by: ?MyChart Message (if you have MyChart) OR ?A paper copy in the mail ?If you have any lab test that is abnormal or we need to change your treatment, we will call you to review the results. ? ? ?Testing/Procedures: ?Your physician has requested that you have a lower or upper extremity venous duplex ASAP This test is an ultrasound of the veins in the legs or arms. It looks at venous blood flow that carries blood from the heart to the legs or arms. Allow one hour for a Lower Venous exam. Allow thirty minutes for an Upper Venous exam. There are no restrictions or special instructions. ? ? ? ? ?Follow-Up: ?At [facility], you and your health needs are our priority.  As part of our continuing mission to provide you with exceptional heart care, we have created designated Provider Care Teams.  These Care Teams include your primary Cardiologist (physician) and Advanced Practice Providers (APPs -  Physician Assistants and Nurse Practitioners) who all work together to provide you with the care you need, when you need it. ? ?We recommend signing up for the patient portal called "MyChart".  Sign up information is provided on this After Visit Summary.  MyChart is used to connect with patients for Virtual Visits (Telemedicine).  Patients are able to view lab/test results, encounter notes, upcoming appointments, etc.  Non-urgent messages can be sent to your provider as well.   ?To learn more about what you can do with MyChart, go to  [email or web address].   ? ?Your next appointment:   ?6-8  week(s) ? ?The format for your next appointment:   ?In Person ? ?Provider:   ?Richardson Dopp, PA-C       ? ? ?Other Instructions ?Call your Neurosurgeon's office regarding your leg weakness  ? ?Important Information About Sugar ? ? ? ? ?  ?

## 2021-06-01 NOTE — Assessment & Plan Note (Signed)
On exam, she has evidence of muscle atrophy in her left thigh.  She has a history of multiple back surgeries.  I have asked her to follow-up with her neurosurgeon for further evaluation. ?

## 2021-06-01 NOTE — Assessment & Plan Note (Signed)
She fell recently and hit her chest.  She has been sore since.  She has some pain with palpation.  Her electrocardiogram today does not demonstrate any acute changes.  I have reassured her that her chest symptoms should improve over the next week or 2.  She knows to contact me if her chest symptoms are not getting better. ?

## 2021-06-01 NOTE — Assessment & Plan Note (Signed)
Prior history of stenting to the LAD, LCx and RCA.  Cardiac catheterization in March 2021 demonstrated diffuse distal RCA disease leading into a severely diseased small PDA.  Medical therapy has been recommended.   As noted, she is having some chest discomfort that seems to be musculoskeletal.  No further testing is needed at this time.  Continue aspirin 81 mg daily.  She is not taking statin therapy at this time.  I have asked her to resume atorvastatin 80 mg daily. ?

## 2021-06-01 NOTE — Assessment & Plan Note (Addendum)
She has significant edema in her legs.  Her right leg is much worse than her left.  She only takes furosemide once or twice a week.  I have encouraged her to take furosemide daily along with potassium.  Obtain BMET, BNP today.  Follow-up BMET 1 week.  Follow-up in 6 weeks. ?

## 2021-06-01 NOTE — Assessment & Plan Note (Signed)
Her R leg is much larger than her L.  I will send her for a venous US to rule out DVT.   ?

## 2021-06-01 NOTE — Assessment & Plan Note (Signed)
Blood pressure is controlled.  Continue atenolol 25 mg daily. ?

## 2021-06-01 NOTE — Assessment & Plan Note (Signed)
S/p endovascular repair.  She is followed by vascular surgery.   ?

## 2021-06-02 LAB — BASIC METABOLIC PANEL
BUN/Creatinine Ratio: 6 — ABNORMAL LOW (ref 12–28)
BUN: 4 mg/dL — ABNORMAL LOW (ref 8–27)
CO2: 27 mmol/L (ref 20–29)
Calcium: 8.8 mg/dL (ref 8.7–10.3)
Chloride: 105 mmol/L (ref 96–106)
Creatinine, Ser: 0.62 mg/dL (ref 0.57–1.00)
Glucose: 81 mg/dL (ref 70–99)
Potassium: 4.1 mmol/L (ref 3.5–5.2)
Sodium: 145 mmol/L — ABNORMAL HIGH (ref 134–144)
eGFR: 93 mL/min/{1.73_m2} (ref >59)

## 2021-06-02 LAB — PRO B NATRIURETIC PEPTIDE: NT-Pro BNP: 214 pg/mL (ref 0–738)

## 2021-06-03 ENCOUNTER — Other Ambulatory Visit: Payer: Self-pay | Admitting: Physician Assistant

## 2021-06-03 DIAGNOSIS — M7989 Other specified soft tissue disorders: Secondary | ICD-10-CM

## 2021-06-04 ENCOUNTER — Encounter (HOSPITAL_COMMUNITY): Payer: Self-pay | Admitting: Radiology

## 2021-06-04 ENCOUNTER — Ambulatory Visit (HOSPITAL_COMMUNITY)
Admission: RE | Admit: 2021-06-04 | Discharge: 2021-06-04 | Disposition: A | Discharge status: Home / Self Care | Payer: Medicare PPO | Source: Ambulatory Visit | Attending: Cardiovascular Disease | Admitting: Cardiovascular Disease

## 2021-06-04 DIAGNOSIS — M7989 Other specified soft tissue disorders: Secondary | ICD-10-CM | POA: Diagnosis not present

## 2021-06-04 NOTE — Progress Notes (Unsigned)
Patient was seen at [facility] to rule out DVT in the right lower extremity. She denies SOB, but c/o chest pain. When asked if she wanted to see a nurse and/or possibly go to ED or have EMS to take her to the ED, she refused. She states if symptoms occur again and drinking a coke doesn't help she will then proceed to the ED.   Varney Biles Diangelo Radel, RDMS, RVT

## 2021-06-07 NOTE — Progress Notes (Signed)
Pt has been made aware of normal result and verbalized understanding.  jw

## 2021-06-08 ENCOUNTER — Other Ambulatory Visit: Payer: Medicare PPO | Admitting: *Deleted

## 2021-06-08 DIAGNOSIS — I10 Essential (primary) hypertension: Secondary | ICD-10-CM

## 2021-06-08 DIAGNOSIS — E782 Mixed hyperlipidemia: Secondary | ICD-10-CM | POA: Diagnosis not present

## 2021-06-08 DIAGNOSIS — I25119 Atherosclerotic heart disease of native coronary artery with unspecified angina pectoris: Secondary | ICD-10-CM

## 2021-06-08 DIAGNOSIS — I5032 Chronic diastolic (congestive) heart failure: Secondary | ICD-10-CM | POA: Diagnosis not present

## 2021-06-08 DIAGNOSIS — I714 Abdominal aortic aneurysm, without rupture, unspecified: Secondary | ICD-10-CM | POA: Diagnosis not present

## 2021-06-09 ENCOUNTER — Ambulatory Visit: Payer: Self-pay

## 2021-06-09 ENCOUNTER — Encounter: Payer: Self-pay | Admitting: Internal Medicine

## 2021-06-09 ENCOUNTER — Ambulatory Visit (INDEPENDENT_AMBULATORY_CARE_PROVIDER_SITE_OTHER): Payer: Medicare PPO | Admitting: Family Medicine

## 2021-06-09 ENCOUNTER — Ambulatory Visit (INDEPENDENT_AMBULATORY_CARE_PROVIDER_SITE_OTHER): Payer: Medicare PPO | Admitting: Internal Medicine

## 2021-06-09 VITALS — BP 102/58 | HR 92 | Ht 66.0 in | Wt 188.0 lb

## 2021-06-09 VITALS — BP 102/58 | HR 92 | Temp 98.9°F | Ht 66.0 in | Wt 188.0 lb

## 2021-06-09 DIAGNOSIS — M170 Bilateral primary osteoarthritis of knee: Secondary | ICD-10-CM | POA: Diagnosis not present

## 2021-06-09 DIAGNOSIS — E538 Deficiency of other specified B group vitamins: Secondary | ICD-10-CM | POA: Diagnosis not present

## 2021-06-09 DIAGNOSIS — M25562 Pain in left knee: Secondary | ICD-10-CM

## 2021-06-09 DIAGNOSIS — M25561 Pain in right knee: Secondary | ICD-10-CM | POA: Diagnosis not present

## 2021-06-09 DIAGNOSIS — M545 Low back pain, unspecified: Secondary | ICD-10-CM | POA: Diagnosis not present

## 2021-06-09 DIAGNOSIS — R600 Localized edema: Secondary | ICD-10-CM

## 2021-06-09 DIAGNOSIS — G8929 Other chronic pain: Secondary | ICD-10-CM

## 2021-06-09 DIAGNOSIS — E559 Vitamin D deficiency, unspecified: Secondary | ICD-10-CM | POA: Diagnosis not present

## 2021-06-09 DIAGNOSIS — E039 Hypothyroidism, unspecified: Secondary | ICD-10-CM

## 2021-06-09 DIAGNOSIS — R5383 Other fatigue: Secondary | ICD-10-CM

## 2021-06-09 LAB — CBC WITH DIFFERENTIAL/PLATELET
Basophils Absolute: 0 10*3/uL (ref 0.0–0.1)
Basophils Relative: 0.6 % (ref 0.0–3.0)
Eosinophils Absolute: 0.1 10*3/uL (ref 0.0–0.7)
Eosinophils Relative: 1.5 % (ref 0.0–5.0)
HCT: 41.2 % (ref 36.0–46.0)
Hemoglobin: 13.2 g/dL (ref 12.0–15.0)
Lymphocytes Relative: 20.7 % (ref 12.0–46.0)
Lymphs Abs: 1.4 10*3/uL (ref 0.7–4.0)
MCHC: 32.2 g/dL (ref 30.0–36.0)
MCV: 91.8 fl (ref 78.0–100.0)
Monocytes Absolute: 0.6 10*3/uL (ref 0.1–1.0)
Monocytes Relative: 8.3 % (ref 3.0–12.0)
Neutro Abs: 4.8 10*3/uL (ref 1.4–7.7)
Neutrophils Relative %: 68.9 % (ref 43.0–77.0)
Platelets: 300 10*3/uL (ref 150.0–400.0)
RBC: 4.49 Mil/uL (ref 3.87–5.11)
RDW: 14.8 % (ref 11.5–15.5)
WBC: 7 10*3/uL (ref 4.0–10.5)

## 2021-06-09 LAB — BASIC METABOLIC PANEL
BUN/Creatinine Ratio: 6 — ABNORMAL LOW (ref 12–28)
BUN: 4 mg/dL — ABNORMAL LOW (ref 8–27)
CO2: 26 mmol/L (ref 20–29)
Calcium: 8.2 mg/dL — ABNORMAL LOW (ref 8.7–10.3)
Chloride: 102 mmol/L (ref 96–106)
Creatinine, Ser: 0.69 mg/dL (ref 0.57–1.00)
Glucose: 78 mg/dL (ref 70–99)
Potassium: 3.9 mmol/L (ref 3.5–5.2)
Sodium: 141 mmol/L (ref 134–144)
eGFR: 90 mL/min/{1.73_m2} (ref >59)

## 2021-06-09 LAB — HEPATIC FUNCTION PANEL
ALT: 7 U/L (ref 0–35)
AST: 17 U/L (ref 0–37)
Albumin: 3.5 g/dL (ref 3.5–5.2)
Alkaline Phosphatase: 99 U/L (ref 39–117)
Bilirubin, Direct: 0.2 mg/dL (ref 0.0–0.3)
Total Bilirubin: 0.6 mg/dL (ref 0.2–1.2)
Total Protein: 7.3 g/dL (ref 6.0–8.3)

## 2021-06-09 LAB — T4, FREE: Free T4: 0.77 ng/dL (ref 0.60–1.60)

## 2021-06-09 LAB — URINALYSIS, ROUTINE W REFLEX MICROSCOPIC
Bilirubin Urine: NEGATIVE
Ketones, ur: NEGATIVE
Nitrite: POSITIVE — AB
Specific Gravity, Urine: 1.015 (ref 1.000–1.030)
Total Protein, Urine: NEGATIVE
Urine Glucose: NEGATIVE
Urobilinogen, UA: 1 (ref 0.0–1.0)
pH: 6 (ref 5.0–8.0)

## 2021-06-09 LAB — TSH: TSH: 4.19 u[IU]/mL (ref 0.35–5.50)

## 2021-06-09 MED ORDER — ATENOLOL 25 MG PO TABS: 25.0000 mg | ORAL_TABLET | Freq: Every day | ORAL | 3 refills | Status: DC

## 2021-06-09 MED ORDER — SYNTHROID 150 MCG PO TABS
ORAL_TABLET | ORAL | 3 refills | Status: DC
Start: 2021-06-09 — End: 2022-04-11

## 2021-06-09 MED ORDER — CYANOCOBALAMIN 1000 MCG/ML IJ SOLN
1000.0000 ug | Freq: Once | INTRAMUSCULAR | Status: AC
  Administered 2021-06-09: 1000 ug via INTRAMUSCULAR

## 2021-06-09 MED ORDER — KETOROLAC TROMETHAMINE 60 MG/2ML IM SOLN
60.0000 mg | Freq: Once | INTRAMUSCULAR | Status: AC
  Administered 2021-06-09: 60 mg via INTRAMUSCULAR

## 2021-06-09 NOTE — Progress Notes (Signed)
Patient ID: [name], female   DOB: 06-19-1945, 76 y.o.   MRN: [id number]        Chief Complaint: follow up chronic pain, right leg pain, dm, low thyroid, faituge, wt loss       HPI:  [name] is a 76 y.o. female here with c/o multiple pain, has oxycodone ER bid per ortho Dr Patrice Paradise on regular basis; worst today seems to be RLE red, tender swelling s/p recent neg venous doppler, denies trauma, fever, chills but LLE has small routine swelling only so right is new in the past week.  Has not done well with B12 oral - gets nauseas, asks for B12 shots.  Not taking Vit D.  Does c/o ongoing fatigue, but denies signficant daytime hypersomnolence, Denies hyper or hypo thyroid symptoms such as voice, skin or hair change.  Denies hyper or hypo thyroid symptoms such as voice, skin or hair change.   Pt denies recent night sweats, but has had loss of appetite and significant wt loss about 15 lbs per pt in the past several months, which she attributes to dental work and teeth pulled during that time, no dentures yet.  Pt continues to have recurring right LBP without change in severity, bowel or bladder change,  worsening LE pain/numbness/weakness, or falls, sees Dr Patrice Paradise.  Wt Readings from Last 3 Encounters:  06/09/21 188 lb (85.3 kg)  06/09/21 188 lb (85.3 kg)  06/01/21 188 lb 9.6 oz (85.5 kg)   BP Readings from Last 3 Encounters:  06/09/21 (!) 102/58  06/09/21 (!) 102/58  06/01/21 (!) 110/50         Past Medical History:  Diagnosis Date   AAA (abdominal aortic aneurysm) ([place])    a. s/p stent graft repair 01/2016.   Acute ischemic colitis ([place]) 07/29/2010   Diagnosed June, 2012 characterized by acute lower GI bleeding, spontaneously resolved.    Acute respiratory infection 06/09/2014   Acute sinus infection 05/14/2015   Allergic rhinitis, cause unspecified    Angioedema of lips 06/03/2014   Anxiety state, unspecified    Arthritis of left hip 04/16/2015   Bilateral hearing loss 07/19/2017   Chronic  LBP    Coronary artery disease    a. inferior MI 1998 s/p PCI of RCA. b. stenting of Cx 04/2010. c. DES to prox LAD 05/2013. d. DES x 3 in 10/2015. // Myoview 07/2018:  EF 59, normal perfusion, low risk    Coronary artery disease involving native heart without angina pectoris 07/31/2006   Qualifier: Diagnosis of  By: [name] MD, [name]  ANGIOGRAPHIC DATA:  1. Ventriculography done in the RAO projection reveals a small wall  motion abnormality in the mid inferior wall. Ejection fraction  would be estimated at around 50%.  2. The right coronary artery has some progressive disease of about 60-  80% in the proximal mid segment. The distal vessel appears to be  50-70% narrowing althou   Degeneration of lumbar or lumbosacral intervertebral disc    Depressive disorder, not elsewhere classified    Diabetes ([place]) 11/03/2006   Qualifier: Diagnosis of  By: [name] MD, [name]    Diabetes mellitus    TYPE II   Frequent urination 05/08/2014   Gout 08/22/2013   History of endovascular stent graft for abdominal aortic aneurysm (AAA) 01/22/2016   Hyperlipidemia    Hypertension    Hypothyroidism    Iron deficiency anemia 06/13/2017   Lower GI bleed 06/2010   Diverticular bleed  Lumbar radiculopathy 05/15/2015   MENOPAUSAL DISORDER 10/29/2007   Qualifier: Diagnosis of  By: [name] MD, [name]    Myalgia 09/25/2013   Noncompliance with medications 02/26/2014   Obesity, unspecified    OTITIS MEDIA, SEROUS, CHRONIC 04/23/2007   Qualifier: Diagnosis of  By: [name] MD, [name]    Thoracic disc disease 11/19/2018   URTICARIA 09/23/2009   Qualifier: Diagnosis of  By: [name] MD, Mateo Flow A    Venous insufficiency 07/19/2017   Past Surgical History:  Procedure Laterality Date   CARDIAC CATHETERIZATION     PCI OF BOTH THE CIRCUMFLEX AND LEFT ANTERIOR DESCENDING ARTERY   CARDIAC CATHETERIZATION N/A 10/29/2015   Procedure: Coronary Stent Intervention;  Surgeon: Nelva Bush, MD;  Location: [place] [facility];  Service:  Cardiovascular;  Laterality: N/A;   CARDIAC CATHETERIZATION N/A 10/29/2015   Procedure: Coronary/Graft Angiography;  Surgeon: Nelva Bush, MD;  Location: [place] [facility];  Service: Cardiovascular;  Laterality: N/A;   CARDIAC CATHETERIZATION N/A 10/29/2015   Procedure: Intravascular Pressure Wire/FFR Study;  Surgeon: Nelva Bush, MD;  Location: [place] [facility];  Service: Cardiovascular;  Laterality: N/A;   CESAREAN SECTION     ENDOVASCULAR STENT INSERTION N/A 01/22/2016   Procedure: ABDOMINAL AORTIC ENDOVASCULAR STENT GRAFT INSERTION;  Surgeon: Rosetta Posner, MD;  Location: [place];  Service: Vascular;  Laterality: N/A;   HEART STENT  04-2010  and  Jun 07, 2013   X 3   LEFT HEART CATH AND CORONARY ANGIOGRAPHY N/A 04/03/2019   Procedure: LEFT HEART CATH AND CORONARY ANGIOGRAPHY;  Surgeon: Nelva Bush, MD;  Location: [place] [facility];  Service: Cardiovascular;  Laterality: N/A;   LEFT HEART CATHETERIZATION WITH CORONARY ANGIOGRAM N/A 06/07/2013   Procedure: LEFT HEART CATHETERIZATION WITH CORONARY ANGIOGRAM;  Surgeon: [name], MD;  Location: [facility] LAB;  Service: Cardiovascular;  Laterality: N/A;   LEFT HEART CATHETERIZATION WITH CORONARY ANGIOGRAM N/A 02/25/2014   Procedure: LEFT HEART CATHETERIZATION WITH CORONARY ANGIOGRAM;  Surgeon: Troy Sine, MD;  Location: [facility] CATH LAB;  Service: Cardiovascular;  Laterality: N/A;   LUMBAR FUSION  01/2007   DR. Patrice Paradise...3-LEVEL WITH FIXATION   OOPHORECTOMY     BSO? pt.unsure   PARATHYROIDECTOMY     SPINE SURGERY     THYROIDECTOMY     TOTAL ABDOMINAL HYSTERECTOMY      reports that she quit smoking about 35 years ago. Her smoking use included cigarettes. She has never used smokeless tobacco. She reports that she does not drink alcohol and does not use drugs. family history includes Arthritis in her maternal aunt; Breast cancer in her maternal aunt; Diabetes in her brother and maternal aunt; Heart attack (age of onset: 27) in her  mother; Heart attack (age of onset: 58) in her father; Heart disease in her father and mother. Allergies  Allergen Reactions   Crestor [Rosuvastatin Calcium] Other (See Comments)    Feeling poor   Prilosec [Omeprazole] Other (See Comments)    Chest pain   Miconazole Nitrate Hives    REACTION: hives   Augmentin [Amoxicillin-Pot Clavulanate] Hives, Itching and Rash    Has patient had a PCN reaction causing immediate rash, facial/tongue/throat swelling, SOB or lightheadedness with hypotension:unsure Has patient had a PCN reaction causing severe rash involving mucus membranes or skin necrosis:unsure Has patient had a PCN reaction that required hospitalization:No Has patient had a PCN reaction occurring within the last 10 years:NO If all of the above answers are "NO", then may proceed with  Cephalosporin use. Has patient had a PCN reaction causing immediate rash, facial/tongue/throat swelling, SOB or lightheadedness with hypotension:unsure Has patient had a PCN reaction causing severe rash involving mucus membranes or skin necrosis:unsure Has patient had a PCN reaction that required hospitalization:No Has patient had a PCN reaction occurring within the last 10 years:NO If all of the above answers are "NO", then may proceed with Cephalosporin use.    Doxycycline Other (See Comments)    REACTION: gi upset   Current Outpatient Medications on File Prior to Visit  Medication Sig Dispense Refill   allopurinol (ZYLOPRIM) 300 MG tablet Take 1 tablet (300 mg total) by mouth as needed (for Gout). 90 tablet 3   aspirin 81 MG EC tablet Take 81 mg by mouth daily.      atorvastatin (LIPITOR) 80 MG tablet Take 1 tablet (80 mg total) by mouth daily.  1   celecoxib (CELEBREX) 200 MG capsule Take 1 capsule (200 mg total) by mouth 2 (two) times daily as needed for moderate pain. 180 capsule 1   Cholecalciferol (THERA-D 2000) 50 MCG (2000 UT) TABS 1 tab by mouth once daily 90 tablet 99   furosemide (LASIX) 40  MG tablet Take 1 tablet (40 mg total) by mouth daily. 90 tablet 3   gabapentin (NEURONTIN) 300 MG capsule Take 1-2 tab by mouth at bedtime for pain and sleep 180 capsule 1   isosorbide mononitrate (IMDUR) 30 MG 24 hr tablet Take 0.5 tablets (15 mg total) by mouth daily. (Patient not taking: Reported on 12/28/2020) 45 tablet 3   nitroGLYCERIN (NITROSTAT) 0.4 MG SL tablet Place 1 tablet (0.4 mg total) under the tongue every 5 (five) minutes as needed for chest pain. 25 tablet 3   pantoprazole (PROTONIX) 40 MG tablet Take 40 mg by mouth daily as needed (heartburn acid reflux).     potassium chloride (KLOR-CON) 10 MEQ tablet Take 1 tablet (10 mEq total) by mouth daily. 90 tablet 3   pregabalin (LYRICA) 75 MG capsule Take 75 mg by mouth daily as needed (pain).      tiZANidine (ZANAFLEX) 2 MG tablet Take 1 tablet (2 mg total) by mouth every 6 (six) hours as needed for muscle spasms. 40 tablet 1   triamcinolone (NASACORT) 55 MCG/ACT AERO nasal inhaler Place 2 sprays into the nose as needed (congestion). 1 each 12   No current facility-administered medications on file prior to visit.        ROS:  All others reviewed and negative.  Objective        PE:  BP (!) 102/58 (BP Location: Left Arm, Patient Position: Sitting, Cuff Size: Large)   Pulse 92   Temp 98.9 F (37.2 C) (Oral)   Ht '5\' 6"'$  (1.676 m)   Wt 188 lb (85.3 kg)   SpO2 94%   BMI 30.34 kg/m                 Constitutional: Pt appears in NAD               HENT: Head: NCAT.                Right Ear: External ear normal.                 Left Ear: External ear normal.                Eyes: . Pupils are equal, round, and reactive to light. Conjunctivae and EOM are normal  Nose: without d/c or deformity               Neck: Neck supple. Gross normal ROM               Cardiovascular: Normal rate and regular rhythm.                 Pulmonary/Chest: Effort normal and breath sounds without rales or wheezing.                Abd:  Soft,  NT, ND, + BS, no organomegaly               Neurological: Pt is alert. At baseline orientation, motor grossly intact               Skin: Skin is warm. No rashes, no other new lesions, LE edema - RLE with 2-3+ mild tender erythema swelling               Psychiatric: Pt behavior is normal without agitation   Micro: none  Cardiac tracings I have personally interpreted today:  none  Pertinent Radiological findings (summarize): none   Lab Results  Component Value Date   WBC 7.0 06/09/2021   HGB 13.2 06/09/2021   HCT 41.2 06/09/2021   PLT 300.0 06/09/2021   GLUCOSE 78 06/08/2021   CHOL 162 02/09/2021   TRIG 93.0 02/09/2021   HDL 37.90 (L) 02/09/2021   LDLDIRECT 131.0 03/27/2015   LDLCALC 106 (H) 02/09/2021   ALT 7 06/09/2021   AST 17 06/09/2021   NA 141 06/08/2021   K 3.9 06/08/2021   CL 102 06/08/2021   CREATININE 0.69 06/08/2021   BUN 4 (L) 06/08/2021   CO2 26 06/08/2021   TSH 4.19 06/09/2021   INR 1.08 01/22/2016   HGBA1C 5.2 04/22/2021   MICROALBUR 0.7 02/09/2021   Assessment/Plan:  [name] is a 76 y.o. Black or African American [2] female with  has a past medical history of AAA (abdominal aortic aneurysm) ([place]), Acute ischemic colitis ([place]) (07/29/2010), Acute respiratory infection (06/09/2014), Acute sinus infection (05/14/2015), Allergic rhinitis, cause unspecified, Angioedema of lips (06/03/2014), Anxiety state, unspecified, Arthritis of left hip (04/16/2015), Bilateral hearing loss (07/19/2017), Chronic LBP, Coronary artery disease, Coronary artery disease involving native heart without angina pectoris (07/31/2006), Degeneration of lumbar or lumbosacral intervertebral disc, Depressive disorder, not elsewhere classified, Diabetes ([place]) (11/03/2006), Diabetes mellitus, Frequent urination (05/08/2014), Gout (08/22/2013), History of endovascular stent graft for abdominal aortic aneurysm (AAA) (01/22/2016), Hyperlipidemia, Hypertension, Hypothyroidism, Iron deficiency anemia (06/13/2017),  Lower GI bleed (06/2010), Lumbar radiculopathy (05/15/2015), MENOPAUSAL DISORDER (10/29/2007), Myalgia (09/25/2013), Noncompliance with medications (02/26/2014), Obesity, unspecified, OTITIS MEDIA, SEROUS, CHRONIC (04/23/2007), Thoracic disc disease (11/19/2018), URTICARIA (09/23/2009), and Venous insufficiency (07/19/2017).  Low back pain Chronic stable, has oxycodone ER bid per ortho - cont to follow  Leg edema, right With recent neg venous dopplers, cant r/o early cellulitis - Mild, for antibx course,  to f/u any worsening symptoms or concerns; also for toradol 30 mg IM  Hypothyroidism Lab Results  Component Value Date   TSH 4.19 06/09/2021   Stable, pt to continue levothyroxine   B12 deficiency For b12 IM monthly - including today  Vitamin D deficiency Last vitamin D Lab Results  Component Value Date   VD25OH 12.11 (L) 02/09/2021   Low, to start oral replacement  Followup: Return in about 4 months (around 10/10/2021).  [name], MD 06/12/2021 5:37 PM [facility]

## 2021-06-09 NOTE — Progress Notes (Signed)
I, [name], LAT, ATC acting as a scribe for Lynne Leader, MD.   [name] is a 76 y.o. female who presents to [place] at [facility] today for cont knee pain due to severe DJD. Pt was last seen by Dr. Georgina Snell on 01/06/2021 and was givenleft  knee steroid injections and was advised to cont quad strengthening exercises. Today, pt reports     Dx imaging: 11/21/19 R & L knee XR   Pertinent review of systems: No fevers or chills   Relevant historical information: Vascular disease.  Diabetes

## 2021-06-09 NOTE — Patient Instructions (Addendum)
Please take all new medication as prescribed - the antibiotic  You had B12 and pain shots (toradol) today  Ok to return once per month for the Nurse B12 shots as well  Please take OTC Vitamin D3 at 2000 units per day, indefinitely  Please continue all other medications as before, and refills have been done if requested - atenolol and thyroid med  Please have the pharmacy call with any other refills you may need.  Please continue your efforts at being more active, low cholesterol diet, and weight control.  You are otherwise up to date with prevention measures today.  Please keep your appointments with your specialists as you may have planned - Dr Patrice Paradise for the 12 hr oxycodone you mentioned  Please go to the LAB at the blood drawing area for the tests to be done  You will be contacted by phone if any changes need to be made immediately.  Otherwise, you will receive a letter about your results with an explanation, but please check with MyChart first.  Please remember to sign up for MyChart if you have not done so, as this will be important to you in the future with finding out test results, communicating by private email, and scheduling acute appointments online when needed.  Please make an Appointment to return in 4 months, or sooner if needed

## 2021-06-09 NOTE — Progress Notes (Signed)
[name] is a 76 y.o. female who presents to [place] at [facility] today for I, [employer] , LAT, ATC acting as a scribe for Lynne Leader, MD.   [name] is a 76 y.o. female who presents to [place] at [facility] today for cont knee pain due to severe DJD. Pt was last seen by Dr. Georgina Snell on 01/06/2021 and was given left  knee steroid injections and was advised to cont quad strengthening exercises. Today, pt reports that her knees are hurting and need some tender love and care bilateral CSIs today if possible     Dx imaging: 11/21/19 R & L knee XR     Pertinent review of systems: No fevers or chills  Relevant historical information: Vascular disease   Exam:  BP (!) 102/58   Pulse 92   Ht '5\' 6"'$  (1.676 m)   Wt 188 lb (85.3 kg)   SpO2 98%   BMI 30.34 kg/m  General: Well Developed, well nourished, and in no acute distress.   MSK: Right knee mild effusion normal motion with crepitation. Left knee large effusion with palpable nodules superior knee.  Decreased range of motion.    Lab and Radiology Results  Procedure: Real-time Ultrasound Guided Injection of left knee superior lateral patellar space Device: Philips Affiniti 50G Images permanently stored and available for review in PACS Verbal informed consent obtained.  Discussed risks and benefits of procedure. Warned about infection, bleeding, hyperglycemia damage to structures among others. Patient expresses understanding and agreement Time-out conducted.   Noted no overlying erythema, induration, or other signs of local infection.   Skin prepped in a sterile fashion.   Local anesthesia: Topical Ethyl chloride.   With sterile technique and under real time ultrasound guidance: 40 mg of Kenalog and 2 mL of Marcaine injected into knee joint. Fluid seen entering the joint capsule.   Completed without difficulty   Pain immediately resolved suggesting accurate placement of the  medication.   Advised to call if fevers/chills, erythema, induration, drainage, or persistent bleeding.   Images permanently stored and available for review in the ultrasound unit.  Impression: Technically successful ultrasound guided injection.    Procedure: Real-time Ultrasound Guided Injection of right knee superior lateral patellar space Device: Philips Affiniti 50G Images permanently stored and available for review in PACS Verbal informed consent obtained.  Discussed risks and benefits of procedure. Warned about infection, bleeding, hyperglycemia damage to structures among others. Patient expresses understanding and agreement Time-out conducted.   Noted no overlying erythema, induration, or other signs of local infection.   Skin prepped in a sterile fashion.   Local anesthesia: Topical Ethyl chloride.   With sterile technique and under real time ultrasound guidance: 40 mg of Kenalog and 2 mL of Marcaine injected into knee joint. Fluid seen entering the joint capsule.   Completed without difficulty   Pain immediately resolved suggesting accurate placement of the medication.   Advised to call if fevers/chills, erythema, induration, drainage, or persistent bleeding.   Images permanently stored and available for review in the ultrasound unit.  Impression: Technically successful ultrasound guided injection.       Assessment and Plan: 76 y.o. female with bilateral knee pain due to DJD.  Plan for steroid injection today.  Plan to recheck back in 3 months or sooner if needed.  Can repeat injection every 3 months.  Recommend quad strengthening as well.  Return sooner if needed.   PDMP not reviewed  this encounter. Orders Placed This Encounter  Procedures   [place] LIMITED JOINT SPACE STRUCTURES LOW BILAT(NO LINKED CHARGES)    Order Specific Question:   Reason for Exam (SYMPTOM  OR DIAGNOSIS REQUIRED)    Answer:   Knee inj BL    Order Specific Question:   Preferred imaging location?     Answer:   [place]   No orders of the defined types were placed in this encounter.    Discussed warning signs or symptoms. Please see discharge instructions. Patient expresses understanding.   The above documentation has been reviewed and is accurate and complete Lynne Leader, M.D.

## 2021-06-09 NOTE — Patient Instructions (Addendum)
Thank you for coming in today.   Recheck in 3 months.   Call or go to the ER if you develop a large red swollen joint with extreme pain or oozing puss.

## 2021-06-12 ENCOUNTER — Encounter: Payer: Self-pay | Admitting: Internal Medicine

## 2021-06-12 NOTE — Assessment & Plan Note (Addendum)
With recent neg venous dopplers, cant r/o early cellulitis - Mild, for antibx course,  to f/u any worsening symptoms or concerns; also for toradol 30 mg IM

## 2021-06-12 NOTE — Assessment & Plan Note (Signed)
Lab Results  Component Value Date   TSH 4.19 06/09/2021   Stable, pt to continue levothyroxine

## 2021-06-12 NOTE — Assessment & Plan Note (Signed)
For b12 IM monthly - including today

## 2021-06-12 NOTE — Assessment & Plan Note (Signed)
Chronic stable, has oxycodone ER bid per ortho - cont to follow

## 2021-06-12 NOTE — Assessment & Plan Note (Signed)
Last vitamin D Lab Results  Component Value Date   VD25OH 12.11 (L) 02/09/2021   Low, to start oral replacement

## 2021-07-06 ENCOUNTER — Encounter: Payer: Self-pay | Admitting: Cardiovascular Disease

## 2021-07-06 ENCOUNTER — Ambulatory Visit (INDEPENDENT_AMBULATORY_CARE_PROVIDER_SITE_OTHER): Payer: Medicare PPO | Admitting: Cardiovascular Disease

## 2021-07-06 VITALS — BP 110/60 | HR 81 | Ht 66.0 in | Wt 178.6 lb

## 2021-07-06 DIAGNOSIS — I5032 Chronic diastolic (congestive) heart failure: Secondary | ICD-10-CM | POA: Diagnosis not present

## 2021-07-06 DIAGNOSIS — I10 Essential (primary) hypertension: Secondary | ICD-10-CM

## 2021-07-06 DIAGNOSIS — I25119 Atherosclerotic heart disease of native coronary artery with unspecified angina pectoris: Secondary | ICD-10-CM | POA: Diagnosis not present

## 2021-07-06 DIAGNOSIS — E782 Mixed hyperlipidemia: Secondary | ICD-10-CM | POA: Diagnosis not present

## 2021-07-06 DIAGNOSIS — I7142 Juxtarenal abdominal aortic aneurysm, without rupture: Secondary | ICD-10-CM | POA: Diagnosis not present

## 2021-07-06 NOTE — Patient Instructions (Signed)
Medication Instructions:  START back on Atorvastatin (if not currently taking) *If you need a refill on your cardiac medications before your next appointment, please call your pharmacy*   Lab Work: NONE If you have labs (blood work) drawn today and your tests are completely normal, you will receive your results only by: [place] (if you have MyChart) OR A paper copy in the mail If you have any lab test that is abnormal or we need to change your treatment, we will call you to review the results.   Testing/Procedures: NONE   Follow-Up: At [facility], you and your health needs are our priority.  As part of our continuing mission to provide you with exceptional heart care, we have created designated Provider Care Teams.  These Care Teams include your primary Cardiologist (physician) and Advanced Practice Providers (APPs -  Physician Assistants and Nurse Practitioners) who all work together to provide you with the care you need, when you need it.  Your next appointment:   6 month(s)  The format for your next appointment:   In Person  Provider:   Beryl Meager, or Kathlen Mody {  Important Information About Sugar

## 2021-07-06 NOTE — Progress Notes (Unsigned)
Cardiology Office Note:    Date:  07/07/2021   ID:  [name], DOB [birth year], MRN [id number]  PCP:  Biagio Borg, MD   [facility] Providers Cardiologist:  Sherren Mocha, MD Cardiology APP:  Sharmon Revere     Referring MD: Biagio Borg, MD   Chief Complaint  Patient presents with   Coronary Artery Disease    History of Present Illness:    [name] is a 76 y.o. female with a hx of: Coronary artery disease  s/p prior inf MI in 1998 tx with stent to the RCA S/p stent LCx in 2012 S/p stent to the LAD in 2015 S/p DES x 3 to the RCA 2017 Myoview 07/2018: low risk Cath 03/2019: dRCA diff dz'd supplying small/dz'd PDA+PL br (not ideal for PCI) >> med Rx (HFpEF) heart failure with preserved ejection fraction  Diabetes mellitus  Hypertension Hyperlipidemia AAA S/p EVAR 01/2016  The patient is here alone today.  She been having some trouble with her sleep cycles.  She stays up and watches TV until about 4:00 in the morning and then is pretty sleepy during the day.  She sleeps late and takes naps during the daytime hours.  She has not had any recent cardiac problems and denies recent chest pain or pressure.  No heart palpitations, orthopnea, or PND.  She has chronic shortness of breath with activity unchanged over time.  She complains of right leg swelling that has improved with use of furosemide.  The left leg does not have any edema.  She has no pain in her leg.  Past Medical History:  Diagnosis Date   AAA (abdominal aortic aneurysm) ([place])    a. s/p stent graft repair 01/2016.   Acute ischemic colitis ([place]) 07/29/2010   Diagnosed June, 2012 characterized by acute lower GI bleeding, spontaneously resolved.    Acute respiratory infection 06/09/2014   Acute sinus infection 05/14/2015   Allergic rhinitis, cause unspecified    Angioedema of lips 06/03/2014   Anxiety state, unspecified    Arthritis of left hip 04/16/2015   Bilateral hearing loss 07/19/2017   Chronic  LBP    Coronary artery disease    a. inferior MI 1998 s/p PCI of RCA. b. stenting of Cx 04/2010. c. DES to prox LAD 05/2013. d. DES x 3 in 10/2015. // Myoview 07/2018:  EF 59, normal perfusion, low risk    Coronary artery disease involving native heart without angina pectoris 07/31/2006   Qualifier: Diagnosis of  By: [name] MD, [name]  ANGIOGRAPHIC DATA:  1. Ventriculography done in the RAO projection reveals a small wall  motion abnormality in the mid inferior wall. Ejection fraction  would be estimated at around 50%.  2. The right coronary artery has some progressive disease of about 60-  80% in the proximal mid segment. The distal vessel appears to be  50-70% narrowing althou   Degeneration of lumbar or lumbosacral intervertebral disc    Depressive disorder, not elsewhere classified    Diabetes ([place]) 11/03/2006   Qualifier: Diagnosis of  By: [name] MD, [name]    Diabetes mellitus    TYPE II   Frequent urination 05/08/2014   Gout 08/22/2013   History of endovascular stent graft for abdominal aortic aneurysm (AAA) 01/22/2016   Hyperlipidemia    Hypertension    Hypothyroidism    Iron deficiency anemia 06/13/2017   Lower GI bleed 06/2010   Diverticular bleed   Lumbar radiculopathy 05/15/2015  MENOPAUSAL DISORDER 10/29/2007   Qualifier: Diagnosis of  By: [name] MD, [name]    Myalgia 09/25/2013   Noncompliance with medications 02/26/2014   Obesity, unspecified    OTITIS MEDIA, SEROUS, CHRONIC 04/23/2007   Qualifier: Diagnosis of  By: [name] MD, [name]    Thoracic disc disease 11/19/2018   URTICARIA 09/23/2009   Qualifier: Diagnosis of  By: [name] MD, Mateo Flow A    Venous insufficiency 07/19/2017    Past Surgical History:  Procedure Laterality Date   CARDIAC CATHETERIZATION     PCI OF BOTH THE CIRCUMFLEX AND LEFT ANTERIOR DESCENDING ARTERY   CARDIAC CATHETERIZATION N/A 10/29/2015   Procedure: Coronary Stent Intervention;  Surgeon: Nelva Bush, MD;  Location: [place] [facility];  Service:  Cardiovascular;  Laterality: N/A;   CARDIAC CATHETERIZATION N/A 10/29/2015   Procedure: Coronary/Graft Angiography;  Surgeon: Nelva Bush, MD;  Location: [place] [facility];  Service: Cardiovascular;  Laterality: N/A;   CARDIAC CATHETERIZATION N/A 10/29/2015   Procedure: Intravascular Pressure Wire/FFR Study;  Surgeon: Nelva Bush, MD;  Location: [place] [facility];  Service: Cardiovascular;  Laterality: N/A;   CESAREAN SECTION     ENDOVASCULAR STENT INSERTION N/A 01/22/2016   Procedure: ABDOMINAL AORTIC ENDOVASCULAR STENT GRAFT INSERTION;  Surgeon: Rosetta Posner, MD;  Location: [place];  Service: Vascular;  Laterality: N/A;   HEART STENT  04-2010  and  Jun 07, 2013   X 3   LEFT HEART CATH AND CORONARY ANGIOGRAPHY N/A 04/03/2019   Procedure: LEFT HEART CATH AND CORONARY ANGIOGRAPHY;  Surgeon: Nelva Bush, MD;  Location: [place] [facility];  Service: Cardiovascular;  Laterality: N/A;   LEFT HEART CATHETERIZATION WITH CORONARY ANGIOGRAM N/A 06/07/2013   Procedure: LEFT HEART CATHETERIZATION WITH CORONARY ANGIOGRAM;  Surgeon: [name], MD;  Location: [facility];  Service: Cardiovascular;  Laterality: N/A;   LEFT HEART CATHETERIZATION WITH CORONARY ANGIOGRAM N/A 02/25/2014   Procedure: LEFT HEART CATHETERIZATION WITH CORONARY ANGIOGRAM;  Surgeon: Troy Sine, MD;  Location: [facility];  Service: Cardiovascular;  Laterality: N/A;   LUMBAR FUSION  01/2007   DR. Patrice Paradise...3-LEVEL WITH FIXATION   OOPHORECTOMY     BSO? pt.unsure   PARATHYROIDECTOMY     SPINE SURGERY     THYROIDECTOMY     TOTAL ABDOMINAL HYSTERECTOMY      Current Medications: Current Meds  Medication Sig   allopurinol (ZYLOPRIM) 300 MG tablet Take 1 tablet (300 mg total) by mouth as needed (for Gout).   aspirin 81 MG EC tablet Take 81 mg by mouth daily.    atenolol (TENORMIN) 25 MG tablet Take 1 tablet (25 mg total) by mouth daily.   atorvastatin (LIPITOR) 80 MG tablet Take 1 tablet (80 mg total) by mouth  daily.   celecoxib (CELEBREX) 200 MG capsule Take 1 capsule (200 mg total) by mouth 2 (two) times daily as needed for moderate pain.   Cholecalciferol (THERA-D 2000) 50 MCG (2000 UT) TABS 1 tab by mouth once daily   furosemide (LASIX) 40 MG tablet Take 1 tablet (40 mg total) by mouth daily.   gabapentin (NEURONTIN) 300 MG capsule Take 1-2 tab by mouth at bedtime for pain and sleep   nitroGLYCERIN (NITROSTAT) 0.4 MG SL tablet Place 1 tablet (0.4 mg total) under the tongue every 5 (five) minutes as needed for chest pain.   pantoprazole (PROTONIX) 40 MG tablet Take 40 mg by mouth daily as needed (heartburn acid reflux).   potassium chloride (KLOR-CON) 10 MEQ tablet  Take 1 tablet (10 mEq total) by mouth daily.   pregabalin (LYRICA) 75 MG capsule Take 75 mg by mouth daily as needed (pain).    SYNTHROID 150 MCG tablet 1 tab by mouth once daily   tiZANidine (ZANAFLEX) 2 MG tablet Take 1 tablet (2 mg total) by mouth every 6 (six) hours as needed for muscle spasms.   triamcinolone (NASACORT) 55 MCG/ACT AERO nasal inhaler Place 2 sprays into the nose as needed (congestion).     Allergies:   Crestor [rosuvastatin calcium], Prilosec [omeprazole], Miconazole nitrate, Augmentin [amoxicillin-pot clavulanate], and Doxycycline   Social History   Socioeconomic History   Marital status: Divorced    Spouse name: Not on file   Number of children: 3   Years of education: 13   Highest education level: Not on file  Occupational History   Occupation: RETIRED    Employer: [employer]    Comment: [employer]    Employer: RETIRED  Tobacco Use   Smoking status: Former    Years: 30.00    Types: Cigarettes    Quit date: 05/31/1986    Years since quitting: 35.1   Smokeless tobacco: Never  Vaping Use   Vaping Use: Never used  Substance and Sexual Activity   Alcohol use: No   Drug use: No   Sexual activity: Not Currently    Comment: 1st intercourse 76 yo-Fewer than 5 partners  Other Topics Concern    Not on file  Social History Narrative   DIVORCED   3 CHILDREN   PATIENT SIGNED A DESIGNATED PARTY RELEASE TO ALLOW HER DAUGHTER, TRAMAINE Bucks, TO HAVE ACCESS TO HER MEDICAL RECORDS/INFORMATION. [name], May 04, 2009 @ 3:27 PM   Right handed   One story home   .   Social Determinants of Health   Financial Resource Strain: Not on file  Food Insecurity: Not on file  Transportation Needs: Not on file  Physical Activity: Not on file  Stress: Not on file  Social Connections: Not on file     Family History: The patient's family history includes Arthritis in her maternal aunt; Breast cancer in her maternal aunt; Diabetes in her brother and maternal aunt; Heart attack (age of onset: 33) in her mother; Heart attack (age of onset: 52) in her father; Heart disease in her father and mother. There is no history of Colon cancer.  ROS:   Please see the history of present illness.    All other systems reviewed and are negative.  EKGs/Labs/Other Studies Reviewed:    The following studies were reviewed today: 2D echocardiogram 02/03/2021:  1. Left ventricular ejection fraction, by estimation, is 55%. The left  ventricle has normal function. The left ventricle demonstrates regional  wall motion abnormalities with basal to mid inferior hypokinesis. Left  ventricular diastolic parameters are  consistent with Grade I diastolic dysfunction (impaired relaxation).   2. Right ventricular systolic function is normal. The right ventricular  size is normal. Tricuspid regurgitation signal is inadequate for assessing  PA pressure.   3. The mitral valve is normal in structure. No evidence of mitral valve  regurgitation. No evidence of mitral stenosis.   4. The aortic valve is tricuspid. Aortic valve regurgitation is not  visualized. Aortic valve sclerosis/calcification is present, without any  evidence of aortic stenosis.   5. The inferior vena cava is normal in size with greater than 50%   respiratory variability, suggesting right atrial pressure of 3 mmHg.   Comparison(s): 02/09/18 EF  50-55%.   EKG:  EKG is not ordered today.    Recent Labs: 06/01/2021: NT-Pro BNP 214 06/08/2021: BUN 4; Creatinine, Ser 0.69; Potassium 3.9; Sodium 141 06/09/2021: ALT 7; Hemoglobin 13.2; Platelets 300.0; TSH 4.19  Recent Lipid Panel    Component Value Date/Time   CHOL 162 02/09/2021 1514   CHOL 143 10/30/2020 1211   TRIG 93.0 02/09/2021 1514   TRIG 102 01/02/2006 0825   HDL 37.90 (L) 02/09/2021 1514   HDL 25 (L) 10/30/2020 1211   CHOLHDL 4 02/09/2021 1514   VLDL 18.6 02/09/2021 1514   LDLCALC 106 (H) 02/09/2021 1514   LDLCALC 94 10/30/2020 1211   LDLDIRECT 131.0 03/27/2015 1137     Risk Assessment/Calculations:           Physical Exam:    VS:  BP 110/60   Pulse 81   Ht '5\' 6"'$  (1.676 m)   Wt 178 lb 9.6 oz (81 kg)   SpO2 95%   BMI 28.83 kg/m     Wt Readings from Last 3 Encounters:  07/06/21 178 lb 9.6 oz (81 kg)  06/09/21 188 lb (85.3 kg)  06/09/21 188 lb (85.3 kg)     GEN:  Well nourished, well developed in no acute distress HEENT: Normal NECK: No JVD; No carotid bruits LYMPHATICS: No lymphadenopathy CARDIAC: RRR, no murmurs, rubs, gallops RESPIRATORY:  Clear to auscultation without rales, wheezing or rhonchi  ABDOMEN: Soft, non-tender, non-distended MUSCULOSKELETAL: 1+ right ankle/foot edema; No deformity  SKIN: Warm and dry NEUROLOGIC:  Alert and oriented x 3 PSYCHIATRIC:  Normal affect   ASSESSMENT:    1. Chronic diastolic heart failure ([place])   2. Coronary artery disease involving native coronary artery of native heart with angina pectoris ([place])   3. Mixed hyperlipidemia   4. Essential hypertension   5. Juxtarenal abdominal aortic aneurysm (AAA) without rupture (HCC)    PLAN:    In order of problems listed above:  Appears stable on current medicines.  Only had grade 1 diastolic dysfunction on her echo from January of this year.  LVEF is preserved  but has been in the low normal range in the past.  Most recent LVEF is 55%. Stable on atenolol, aspirin. Last LDL cholesterol is 106 mg/dL.  She has not been compliant with statin drugs and I do not think she is currently taking atorvastatin even though it is on her medicine list.  We are going to call her so that she can look at her pill bottles and see if she is taking this.  Again, she thinks she has been off of it now for some time.  She was counseled when she saw Nicki Reaper last time to take her atorvastatin on a daily basis. Blood pressure is well controlled on current therapy.  Most recent labs reviewed with a creatinine of 0.69. Status post EVAR.  Followed by vascular surgery.     Medication Adjustments/Labs and Tests Ordered: Current medicines are reviewed at length with the patient today.  Concerns regarding medicines are outlined above.  No orders of the defined types were placed in this encounter.  No orders of the defined types were placed in this encounter.   Patient Instructions  Medication Instructions:  START back on Atorvastatin (if not currently taking) *If you need a refill on your cardiac medications before your next appointment, please call your pharmacy*   Lab Work: NONE If you have labs (blood work) drawn today and your tests are completely normal, you will receive your  results only by: MyChart Message (if you have MyChart) OR A paper copy in the mail If you have any lab test that is abnormal or we need to change your treatment, we will call you to review the results.   Testing/Procedures: NONE   Follow-Up: At [facility], you and your health needs are our priority.  As part of our continuing mission to provide you with exceptional heart care, we have created designated Provider Care Teams.  These Care Teams include your primary Cardiologist (physician) and Advanced Practice Providers (APPs -  Physician Assistants and Nurse Practitioners) who all work together  to provide you with the care you need, when you need it.  Your next appointment:   6 month(s)  The format for your next appointment:   In Person  Provider:   Beryl Meager, or Kathlen Mody {  Important Information About Sugar         Signed, Sherren Mocha, MD  07/07/2021 4:36 PM    [facility]

## 2021-07-13 ENCOUNTER — Ambulatory Visit: Payer: Medicare PPO | Admitting: Physician Assistant

## 2021-07-15 ENCOUNTER — Ambulatory Visit (INDEPENDENT_AMBULATORY_CARE_PROVIDER_SITE_OTHER): Payer: Medicare PPO

## 2021-07-15 DIAGNOSIS — E538 Deficiency of other specified B group vitamins: Secondary | ICD-10-CM | POA: Diagnosis not present

## 2021-07-15 MED ORDER — CYANOCOBALAMIN 1000 MCG/ML IJ SOLN
1000.0000 ug | Freq: Once | INTRAMUSCULAR | Status: AC
  Administered 2021-07-15: 1000 ug via INTRAMUSCULAR

## 2021-07-21 NOTE — Progress Notes (Addendum)
After obtaining consent, and per orders of Dr. John, injection of B12 given by Darrel Baroni. Patient tolerated injection well in left deltoid andinstructed to report any adverse reaction to me immediately.  

## 2021-08-10 ENCOUNTER — Ambulatory Visit: Payer: Medicare PPO | Admitting: Internal Medicine

## 2021-08-16 ENCOUNTER — Ambulatory Visit (INDEPENDENT_AMBULATORY_CARE_PROVIDER_SITE_OTHER): Payer: Medicare PPO

## 2021-08-16 DIAGNOSIS — E538 Deficiency of other specified B group vitamins: Secondary | ICD-10-CM | POA: Diagnosis not present

## 2021-08-16 DIAGNOSIS — J40 Bronchitis, not specified as acute or chronic: Secondary | ICD-10-CM | POA: Diagnosis not present

## 2021-08-16 DIAGNOSIS — H66001 Acute suppurative otitis media without spontaneous rupture of ear drum, right ear: Secondary | ICD-10-CM | POA: Diagnosis not present

## 2021-08-16 MED ORDER — CYANOCOBALAMIN 1000 MCG/ML IJ SOLN
1000.0000 ug | Freq: Once | INTRAMUSCULAR | Status: AC
  Administered 2021-08-16: 1000 ug via INTRAMUSCULAR

## 2021-08-16 NOTE — Progress Notes (Signed)
After obtaining consent, and per orders of Dr. [name], injection of B12 given by Max Sane. Patient tolerated injection well in left deltoid and informed to report any adverse reaction to me immediately.

## 2021-09-09 ENCOUNTER — Ambulatory Visit (INDEPENDENT_AMBULATORY_CARE_PROVIDER_SITE_OTHER): Payer: Medicare PPO | Admitting: Family Medicine

## 2021-09-09 ENCOUNTER — Ambulatory Visit: Payer: Self-pay

## 2021-09-09 VITALS — BP 142/76 | HR 80 | Ht 66.0 in | Wt 180.0 lb

## 2021-09-09 DIAGNOSIS — G8929 Other chronic pain: Secondary | ICD-10-CM

## 2021-09-09 DIAGNOSIS — M25562 Pain in left knee: Secondary | ICD-10-CM

## 2021-09-09 DIAGNOSIS — M170 Bilateral primary osteoarthritis of knee: Secondary | ICD-10-CM

## 2021-09-09 DIAGNOSIS — M25561 Pain in right knee: Secondary | ICD-10-CM | POA: Diagnosis not present

## 2021-09-09 NOTE — Patient Instructions (Addendum)
Thank you for coming in today.   You received an injection today. Seek immediate medical attention if the joint becomes red, extremely painful, or is oozing fluid.   Recheck back in 3 months

## 2021-09-09 NOTE — Progress Notes (Signed)
I, [name], LAT, ATC acting as a scribe for Lynne Leader, MD.  [name] is a 76 y.o. female who presents to [place] at [facility] today for cont bilat knee pain due to severe DJD. Pt was last seen by Dr. Georgina Snell on 06/09/21 and was given bilat knee steroid injections and advised to work on quad strengthening. Today, pt reports bilat knees just started hurting again last week. Bilat knees have swelling present.   Dx imaging: 11/21/19 R & L knee XR  Pertinent review of systems: No fevers or chills  Relevant historical information: Peripheral vascular disease.   Exam:  BP (!) 142/76   Pulse 80   Ht '5\' 6"'$  (1.676 m)   Wt 180 lb (81.6 kg)   SpO2 98%   BMI 29.05 kg/m  General: Well Developed, well nourished, and in no acute distress.   MSK: Right knee: Moderate to large joint effusion otherwise normal-appearing Decreased range of motion with pain.  Left knee: Moderate to large joint effusion.  Decreased range of motion with crepitation and pain.    Lab and Radiology Results   Procedure: Real-time Ultrasound Guided Injection of right knee superior lateral patellar space Device: Philips Affiniti 50G Images permanently stored and available for review in PACS Verbal informed consent obtained.  Discussed risks and benefits of procedure. Warned about infection, bleeding, hyperglycemia damage to structures among others. Patient expresses understanding and agreement Time-out conducted.   Noted no overlying erythema, induration, or other signs of local infection.   Skin prepped in a sterile fashion.   Local anesthesia: Topical Ethyl chloride.   With sterile technique and under real time ultrasound guidance: 40 mg of Kenalog and 2 mL of Marcaine injected into knee joint. Fluid seen entering the joint capsule.   Completed without difficulty   Pain immediately resolved suggesting accurate placement of the medication.   Advised to call if fevers/chills,  erythema, induration, drainage, or persistent bleeding.   Images permanently stored and available for review in the ultrasound unit.  Impression: Technically successful ultrasound guided injection.    Procedure: Real-time Ultrasound Guided Injection of left knee superior lateral patellar space Device: Philips Affiniti 50G Images permanently stored and available for review in PACS Verbal informed consent obtained.  Discussed risks and benefits of procedure. Warned about infection, bleeding, hyperglycemia damage to structures among others. Patient expresses understanding and agreement Time-out conducted.   Noted no overlying erythema, induration, or other signs of local infection.   Skin prepped in a sterile fashion.   Local anesthesia: Topical Ethyl chloride.   With sterile technique and under real time ultrasound guidance: 40 mg of Kenalog and 2 mL of Marcaine injected into knee joint. Fluid seen entering the joint capsule.   Completed without difficulty   Pain immediately resolved suggesting accurate placement of the medication.   Advised to call if fevers/chills, erythema, induration, drainage, or persistent bleeding.   Images permanently stored and available for review in the ultrasound unit.  Impression: Technically successful ultrasound guided injection.         Assessment and Plan: 76 y.o. female with bilateral knee pain due to exacerbation of DJD.  This is a chronic and ongoing problem.  Her last knee injection was 3 months ago and her pain just started up again.  Plan for repeat steroid injection.  Plan on rechecking again in about 3 months.  Could repeat injection at that time if needed.   PDMP not reviewed this encounter. Orders Placed  This Encounter  Procedures   [place] LIMITED JOINT SPACE STRUCTURES LOW BILAT(NO LINKED CHARGES)    Order Specific Question:   Reason for Exam (SYMPTOM  OR DIAGNOSIS REQUIRED)    Answer:   bilateral knee pain    Order Specific Question:    Preferred imaging location?    Answer:   [place]   No orders of the defined types were placed in this encounter.    Discussed warning signs or symptoms. Please see discharge instructions. Patient expresses understanding.   The above documentation has been reviewed and is accurate and complete Lynne Leader, M.D.

## 2021-09-16 ENCOUNTER — Telehealth: Payer: Self-pay

## 2021-09-16 ENCOUNTER — Ambulatory Visit (INDEPENDENT_AMBULATORY_CARE_PROVIDER_SITE_OTHER): Payer: Medicare PPO

## 2021-09-16 DIAGNOSIS — E538 Deficiency of other specified B group vitamins: Secondary | ICD-10-CM | POA: Diagnosis not present

## 2021-09-16 MED ORDER — CYANOCOBALAMIN 1000 MCG/ML IJ SOLN
1000.0000 ug | Freq: Once | INTRAMUSCULAR | Status: AC
  Administered 2021-09-16: 1000 ug via INTRAMUSCULAR

## 2021-09-16 MED ORDER — FLUCONAZOLE 150 MG PO TABS: ORAL_TABLET | ORAL | 1 refills | Status: DC

## 2021-09-16 NOTE — Progress Notes (Signed)
B12 given.  Pt tolerated well. Pt is aware to give the office a call for an side effects or reactions. Please co-sign.   

## 2021-09-16 NOTE — Telephone Encounter (Signed)
Done erx 

## 2021-09-16 NOTE — Addendum Note (Signed)
Addended by: Biagio Borg on: 09/16/2021 10:34 PM   Modules accepted: Orders

## 2021-09-16 NOTE — Telephone Encounter (Signed)
Pt was in for B12 injection today.   She is requesting Rx for yeast infection. She recently completed a course of abx.   Please advise.

## 2021-09-17 NOTE — Telephone Encounter (Signed)
LVM informing pt that Rx was sent.

## 2021-10-04 ENCOUNTER — Telehealth: Payer: Self-pay | Admitting: Internal Medicine

## 2021-10-04 NOTE — Telephone Encounter (Signed)
Left message for a call back.

## 2021-10-04 NOTE — Telephone Encounter (Signed)
Patient would like something for a yeast infection - please call patient.  She would like to speak with the nurse before this is sent in.

## 2021-10-11 ENCOUNTER — Ambulatory Visit (INDEPENDENT_AMBULATORY_CARE_PROVIDER_SITE_OTHER): Payer: Medicare PPO | Admitting: Internal Medicine

## 2021-10-11 ENCOUNTER — Encounter: Payer: Self-pay | Admitting: Internal Medicine

## 2021-10-11 VITALS — BP 136/74 | HR 72 | Temp 98.6°F | Ht 66.0 in | Wt 182.0 lb

## 2021-10-11 DIAGNOSIS — Z23 Encounter for immunization: Secondary | ICD-10-CM | POA: Diagnosis not present

## 2021-10-11 DIAGNOSIS — I10 Essential (primary) hypertension: Secondary | ICD-10-CM | POA: Diagnosis not present

## 2021-10-11 DIAGNOSIS — M545 Low back pain, unspecified: Secondary | ICD-10-CM | POA: Diagnosis not present

## 2021-10-11 DIAGNOSIS — G8929 Other chronic pain: Secondary | ICD-10-CM | POA: Diagnosis not present

## 2021-10-11 DIAGNOSIS — E559 Vitamin D deficiency, unspecified: Secondary | ICD-10-CM | POA: Diagnosis not present

## 2021-10-11 DIAGNOSIS — E1165 Type 2 diabetes mellitus with hyperglycemia: Secondary | ICD-10-CM | POA: Diagnosis not present

## 2021-10-11 DIAGNOSIS — F5101 Primary insomnia: Secondary | ICD-10-CM | POA: Diagnosis not present

## 2021-10-11 MED ORDER — APIXABAN 5 MG PO TABS: 5.0000 mg | ORAL_TABLET | Freq: Two times a day (BID) | ORAL | 3 refills | Status: DC

## 2021-10-11 MED ORDER — FLUCONAZOLE 150 MG PO TABS: ORAL_TABLET | ORAL | 1 refills | Status: DC

## 2021-10-11 MED ORDER — DILTIAZEM HCL ER COATED BEADS 180 MG PO CP24: 180.0000 mg | ORAL_CAPSULE | Freq: Every day | ORAL | 3 refills | Status: DC

## 2021-10-11 NOTE — Assessment & Plan Note (Signed)
Pt agrees to trial gabapentin 300 qhs prn., has rx at home

## 2021-10-11 NOTE — Assessment & Plan Note (Signed)
Lab Results  Component Value Date   HGBA1C 5.2 04/22/2021   Stable, pt to continue current medical treatment  - diet, wt control, excercise

## 2021-10-11 NOTE — Patient Instructions (Addendum)
You had the flu shot today  Ok to try the gabapentin 300 mg at bedtime to help with sleep  Please disregard any eliquis or cardizem CD you might hear from the pharmacy  Please continue all other medications as before, and refills have been done if requested.  Please have the pharmacy call with any other refills you may need.  Please continue your efforts at being more active, low cholesterol diet, and weight control  Please keep your appointments with your specialists as you may have planned  We can hold on lab testing today  [place] for your daughter to see [facility] - Dr Stann Mainland  Please make an Appointment to return in 4 months, or sooner if needed

## 2021-10-11 NOTE — Assessment & Plan Note (Signed)
BP Readings from Last 3 Encounters:  10/11/21 136/74  09/09/21 (!) 142/76  07/06/21 110/60   Stable, pt to continue medical treatment tenormin 25 mg qd

## 2021-10-11 NOTE — Assessment & Plan Note (Signed)
Last vitamin D Lab Results  Component Value Date   VD25OH 12.11 (L) 02/09/2021   Low, reminded to start oral replacement

## 2021-10-11 NOTE — Assessment & Plan Note (Signed)
Pt requesting oxycontin, but I deferred her to her usual pain provider

## 2021-10-11 NOTE — Progress Notes (Signed)
Patient ID: [name], female   DOB: 06-Apr-1945, 76 y.o.   MRN: [id number]        Chief Complaint: follow up HTN, HLD and hyperglycemia, chronic pain, insomnia       HPI:  [name] is a 76 y.o. female here with c/o persistent insomnia, did not try the gabapentin but now willing.  Pt denies chest pain, increased sob or doe, wheezing, orthopnea, PND, increased LE swelling, palpitations, dizziness or syncope.   Pt denies polydipsia, polyuria, or new focal neuro s/s.    Pt denies fever, wt loss, night sweats, loss of appetite, or other constitutional symptoms  Has chronic pain asking for narcotic, but I deferred to her normal provider.          Due for flu shot.   Wt Readings from Last 3 Encounters:  10/11/21 182 lb (82.6 kg)  09/09/21 180 lb (81.6 kg)  07/06/21 178 lb 9.6 oz (81 kg)   BP Readings from Last 3 Encounters:  10/11/21 136/74  09/09/21 (!) 142/76  07/06/21 110/60         Past Medical History:  Diagnosis Date   AAA (abdominal aortic aneurysm) ([place])    a. s/p stent graft repair 01/2016.   Acute ischemic colitis ([place]) 07/29/2010   Diagnosed June, 2012 characterized by acute lower GI bleeding, spontaneously resolved.    Acute respiratory infection 06/09/2014   Acute sinus infection 05/14/2015   Allergic rhinitis, cause unspecified    Angioedema of lips 06/03/2014   Anxiety state, unspecified    Arthritis of left hip 04/16/2015   Bilateral hearing loss 07/19/2017   Chronic LBP    Coronary artery disease    a. inferior MI 1998 s/p PCI of RCA. b. stenting of Cx 04/2010. c. DES to prox LAD 05/2013. d. DES x 3 in 10/2015. // Myoview 07/2018:  EF 59, normal perfusion, low risk    Coronary artery disease involving native heart without angina pectoris 07/31/2006   Qualifier: Diagnosis of  By: [name] MD, [name]  ANGIOGRAPHIC DATA:  1. Ventriculography done in the RAO projection reveals a small wall  motion abnormality in the mid inferior wall. Ejection fraction  would be estimated at  around 50%.  2. The right coronary artery has some progressive disease of about 60-  80% in the proximal mid segment. The distal vessel appears to be  50-70% narrowing althou   Degeneration of lumbar or lumbosacral intervertebral disc    Depressive disorder, not elsewhere classified    Diabetes ([place]) 11/03/2006   Qualifier: Diagnosis of  By: [name] MD, [name]    Diabetes mellitus    TYPE II   Frequent urination 05/08/2014   Gout 08/22/2013   History of endovascular stent graft for abdominal aortic aneurysm (AAA) 01/22/2016   Hyperlipidemia    Hypertension    Hypothyroidism    Iron deficiency anemia 06/13/2017   Lower GI bleed 06/2010   Diverticular bleed   Lumbar radiculopathy 05/15/2015   MENOPAUSAL DISORDER 10/29/2007   Qualifier: Diagnosis of  By: [name] MD, [name]    Myalgia 09/25/2013   Noncompliance with medications 02/26/2014   Obesity, unspecified    OTITIS MEDIA, SEROUS, CHRONIC 04/23/2007   Qualifier: Diagnosis of  By: [name] MD, [name]    Thoracic disc disease 11/19/2018   URTICARIA 09/23/2009   Qualifier: Diagnosis of  By: [name] MD, Mateo Flow A    Venous insufficiency 07/19/2017   Past Surgical History:  Procedure Laterality Date  CARDIAC CATHETERIZATION     PCI OF BOTH THE CIRCUMFLEX AND LEFT ANTERIOR DESCENDING ARTERY   CARDIAC CATHETERIZATION N/A 10/29/2015   Procedure: Coronary Stent Intervention;  Surgeon: Nelva Bush, MD;  Location: [facility];  Service: Cardiovascular;  Laterality: N/A;   CARDIAC CATHETERIZATION N/A 10/29/2015   Procedure: Coronary/Graft Angiography;  Surgeon: Nelva Bush, MD;  Location: [facility];  Service: Cardiovascular;  Laterality: N/A;   CARDIAC CATHETERIZATION N/A 10/29/2015   Procedure: Intravascular Pressure Wire/FFR Study;  Surgeon: Nelva Bush, MD;  Location: [facility];  Service: Cardiovascular;  Laterality: N/A;   CESAREAN SECTION     ENDOVASCULAR STENT INSERTION N/A 01/22/2016   Procedure: ABDOMINAL AORTIC  ENDOVASCULAR STENT GRAFT INSERTION;  Surgeon: Rosetta Posner, MD;  Location: [facility];  Service: Vascular;  Laterality: N/A;   HEART STENT  04-2010  and  Jun 07, 2013   X 3   LEFT HEART CATH AND CORONARY ANGIOGRAPHY N/A 04/03/2019   Procedure: LEFT HEART CATH AND CORONARY ANGIOGRAPHY;  Surgeon: Nelva Bush, MD;  Location: [facility];  Service: Cardiovascular;  Laterality: N/A;   LEFT HEART CATHETERIZATION WITH CORONARY ANGIOGRAM N/A 06/07/2013   Procedure: LEFT HEART CATHETERIZATION WITH CORONARY ANGIOGRAM;  Surgeon: [name], MD;  Location: [facility];  Service: Cardiovascular;  Laterality: N/A;   LEFT HEART CATHETERIZATION WITH CORONARY ANGIOGRAM N/A 02/25/2014   Procedure: LEFT HEART CATHETERIZATION WITH CORONARY ANGIOGRAM;  Surgeon: Troy Sine, MD;  Location: [facility];  Service: Cardiovascular;  Laterality: N/A;   LUMBAR FUSION  01/2007   DR. Patrice Paradise...3-LEVEL WITH FIXATION   OOPHORECTOMY     BSO? pt.unsure   PARATHYROIDECTOMY     SPINE SURGERY     THYROIDECTOMY     TOTAL ABDOMINAL HYSTERECTOMY      reports that she quit smoking about 35 years ago. Her smoking use included cigarettes. She has never used smokeless tobacco. She reports that she does not drink alcohol and does not use drugs. family history includes Arthritis in her maternal aunt; Breast cancer in her maternal aunt; Diabetes in her brother and maternal aunt; Heart attack (age of onset: 72) in her mother; Heart attack (age of onset: 64) in her father; Heart disease in her father and mother. Allergies  Allergen Reactions   Crestor [Rosuvastatin Calcium] Other (See Comments)    Feeling poor   Prilosec [Omeprazole] Other (See Comments)    Chest pain   Miconazole Nitrate Hives    REACTION: hives   Augmentin [Amoxicillin-Pot Clavulanate] Hives, Itching and Rash    Has patient had a PCN reaction causing immediate rash, facial/tongue/throat swelling, SOB or lightheadedness with hypotension:unsure Has  patient had a PCN reaction causing severe rash involving mucus membranes or skin necrosis:unsure Has patient had a PCN reaction that required hospitalization:No Has patient had a PCN reaction occurring within the last 10 years:NO If all of the above answers are "NO", then may proceed with Cephalosporin use. Has patient had a PCN reaction causing immediate rash, facial/tongue/throat swelling, SOB or lightheadedness with hypotension:unsure Has patient had a PCN reaction causing severe rash involving mucus membranes or skin necrosis:unsure Has patient had a PCN reaction that required hospitalization:No Has patient had a PCN reaction occurring within the last 10 years:NO If all of the above answers are "NO", then may proceed with Cephalosporin use.    Doxycycline Other (See Comments)    REACTION: gi upset   Current Outpatient Medications on File Prior to Visit  Medication Sig Dispense Refill   allopurinol (ZYLOPRIM) 300 MG tablet Take 1 tablet (300 mg total) by mouth as needed (for Gout). 90 tablet 3   aspirin 81 MG EC tablet Take 81 mg by mouth daily.      atenolol (TENORMIN) 25 MG tablet Take 1 tablet (25 mg total) by mouth daily. 90 tablet 3   atorvastatin (LIPITOR) 80 MG tablet Take 1 tablet (80 mg total) by mouth daily.  1   celecoxib (CELEBREX) 200 MG capsule Take 1 capsule (200 mg total) by mouth 2 (two) times daily as needed for moderate pain. 180 capsule 1   Cholecalciferol (THERA-D 2000) 50 MCG (2000 UT) TABS 1 tab by mouth once daily 90 tablet 99   furosemide (LASIX) 40 MG tablet Take 1 tablet (40 mg total) by mouth daily. 90 tablet 3   gabapentin (NEURONTIN) 300 MG capsule Take 1-2 tab by mouth at bedtime for pain and sleep 180 capsule 1   nitroGLYCERIN (NITROSTAT) 0.4 MG SL tablet Place 1 tablet (0.4 mg total) under the tongue every 5 (five) minutes as needed for chest pain. 25 tablet 3   pantoprazole (PROTONIX) 40 MG tablet Take 40 mg by mouth daily as needed (heartburn acid  reflux).     potassium chloride (KLOR-CON) 10 MEQ tablet Take 1 tablet (10 mEq total) by mouth daily. 90 tablet 3   pregabalin (LYRICA) 75 MG capsule Take 75 mg by mouth daily as needed (pain).      SYNTHROID 150 MCG tablet 1 tab by mouth once daily 90 tablet 3   tiZANidine (ZANAFLEX) 2 MG tablet Take 1 tablet (2 mg total) by mouth every 6 (six) hours as needed for muscle spasms. 40 tablet 1   triamcinolone (NASACORT) 55 MCG/ACT AERO nasal inhaler Place 2 sprays into the nose as needed (congestion). 1 each 12   isosorbide mononitrate (IMDUR) 30 MG 24 hr tablet Take 0.5 tablets (15 mg total) by mouth daily. (Patient not taking: Reported on 12/28/2020) 45 tablet 3   No current facility-administered medications on file prior to visit.        ROS:  All others reviewed and negative.  Objective        PE:  BP 136/74 (BP Location: Left Arm, Patient Position: Sitting, Cuff Size: Large)   Pulse 72   Temp 98.6 F (37 C) (Oral)   Ht '5\' 6"'$  (1.676 m)   Wt 182 lb (82.6 kg)   SpO2 98%   BMI 29.38 kg/m                 Constitutional: Pt appears in NAD               HENT: Head: NCAT.                Right Ear: External ear normal.                 Left Ear: External ear normal.                Eyes: . Pupils are equal, round, and reactive to light. Conjunctivae and EOM are normal               Nose: without d/c or deformity               Neck: Neck supple. Gross normal ROM               Cardiovascular: Normal rate and regular rhythm.  Pulmonary/Chest: Effort normal and breath sounds without rales or wheezing.                Abd:  Soft, NT, ND, + BS, no organomegaly               Neurological: Pt is alert. At baseline orientation, motor grossly intact               Skin: Skin is warm. No rashes, no other new lesions, LE edema - none               Psychiatric: Pt behavior is normal without agitation   Micro: none  Cardiac tracings I have personally interpreted today:   none  Pertinent Radiological findings (summarize): none   Lab Results  Component Value Date   WBC 7.0 06/09/2021   HGB 13.2 06/09/2021   HCT 41.2 06/09/2021   PLT 300.0 06/09/2021   GLUCOSE 78 06/08/2021   CHOL 162 02/09/2021   TRIG 93.0 02/09/2021   HDL 37.90 (L) 02/09/2021   LDLDIRECT 131.0 03/27/2015   LDLCALC 106 (H) 02/09/2021   ALT 7 06/09/2021   AST 17 06/09/2021   NA 141 06/08/2021   K 3.9 06/08/2021   CL 102 06/08/2021   CREATININE 0.69 06/08/2021   BUN 4 (L) 06/08/2021   CO2 26 06/08/2021   TSH 4.19 06/09/2021   INR 1.08 01/22/2016   HGBA1C 5.2 04/22/2021   MICROALBUR 0.7 02/09/2021   Assessment/Plan:  [name] is a 76 y.o. Black or African American [2] female with  has a past medical history of AAA (abdominal aortic aneurysm) ([place]), Acute ischemic colitis ([place]) (07/29/2010), Acute respiratory infection (06/09/2014), Acute sinus infection (05/14/2015), Allergic rhinitis, cause unspecified, Angioedema of lips (06/03/2014), Anxiety state, unspecified, Arthritis of left hip (04/16/2015), Bilateral hearing loss (07/19/2017), Chronic LBP, Coronary artery disease, Coronary artery disease involving native heart without angina pectoris (07/31/2006), Degeneration of lumbar or lumbosacral intervertebral disc, Depressive disorder, not elsewhere classified, Diabetes ([place]) (11/03/2006), Diabetes mellitus, Frequent urination (05/08/2014), Gout (08/22/2013), History of endovascular stent graft for abdominal aortic aneurysm (AAA) (01/22/2016), Hyperlipidemia, Hypertension, Hypothyroidism, Iron deficiency anemia (06/13/2017), Lower GI bleed (06/2010), Lumbar radiculopathy (05/15/2015), MENOPAUSAL DISORDER (10/29/2007), Myalgia (09/25/2013), Noncompliance with medications (02/26/2014), Obesity, unspecified, OTITIS MEDIA, SEROUS, CHRONIC (04/23/2007), Thoracic disc disease (11/19/2018), URTICARIA (09/23/2009), and Venous insufficiency (07/19/2017).  Chronic low back pain Pt requesting oxycontin, but I deferred  her to her usual pain provider  Diabetes [facility]) Lab Results  Component Value Date   HGBA1C 5.2 04/22/2021   Stable, pt to continue current medical treatment  - diet, wt control, excercise   Essential hypertension BP Readings from Last 3 Encounters:  10/11/21 136/74  09/09/21 (!) 142/76  07/06/21 110/60   Stable, pt to continue medical treatment tenormin 25 mg qd   Vitamin D deficiency Last vitamin D Lab Results  Component Value Date   VD25OH 12.11 (L) 02/09/2021   Low, reminded to start oral replacement   Insomnia Pt agrees to trial gabapentin 300 qhs prn., has rx at home  Followup: Return in about 4 months (around 02/10/2022).  [name], MD 10/11/2021 7:49 PM [facility]

## 2021-10-14 ENCOUNTER — Ambulatory Visit: Payer: Medicare PPO

## 2021-10-25 ENCOUNTER — Ambulatory Visit: Payer: Self-pay

## 2021-10-25 ENCOUNTER — Ambulatory Visit (INDEPENDENT_AMBULATORY_CARE_PROVIDER_SITE_OTHER): Payer: Medicare PPO | Admitting: Family Medicine

## 2021-10-25 VITALS — BP 140/86 | HR 86 | Ht 66.0 in | Wt 186.0 lb

## 2021-10-25 DIAGNOSIS — G8929 Other chronic pain: Secondary | ICD-10-CM | POA: Diagnosis not present

## 2021-10-25 DIAGNOSIS — M25562 Pain in left knee: Secondary | ICD-10-CM

## 2021-10-25 DIAGNOSIS — M170 Bilateral primary osteoarthritis of knee: Secondary | ICD-10-CM

## 2021-10-25 DIAGNOSIS — M25561 Pain in right knee: Secondary | ICD-10-CM | POA: Diagnosis not present

## 2021-10-25 MED ORDER — OXYCODONE-ACETAMINOPHEN 5-325 MG PO TABS: 1.0000 | ORAL_TABLET | Freq: Three times a day (TID) | ORAL | 0 refills | Status: DC | PRN

## 2021-10-25 NOTE — Patient Instructions (Signed)
Thank you for coming in today.   We are working on authorizing the gel shots and zilretta shots for the knee.   You should hear soon.   Use oxycodone sparingly.

## 2021-10-25 NOTE — Progress Notes (Signed)
   I, [name], LAT, ATC acting as a scribe for Brandi Leader, MD.  [name] is a 76 y.o. female who presents to [place] at [facility] today for f/u chronic bilateral knee pain.  Patient was last seen by Dr. Georgina Snell  on 09/09/2021 and was given bilateral knee steroid injections.  Today, patient reports bilateral knee pain returned about 1 week ago. Pt c/o bilat knee pain being so bad she can hardly walk and has worsened.   Dx imaging: 11/21/19 [facility] & L knee XR  Pertinent review of systems: No fevers or chills  Relevant historical information: Prior oxycodone prescription for back pain.  Last prescription was November 2022.   Exam:  BP (!) 140/86   Pulse 86   Ht '5\' 6"'$  (1.676 m)   Wt 186 lb (84.4 kg)   SpO2 96%   BMI 30.02 kg/m  General: Well Developed, well nourished, and in no acute distress.   MSK: Knees bilaterally moderate effusion decreased range of motion with crepitation.  Using a cane to ambulate.     Assessment and Plan: 76 y.o. female with bilateral knee pain.  She is 6 weeks out from last steroid injection.  Plan to authorize Zilretta and gel shots now so that we can proceed with those next step injections ASAP.  She is in quite a bit of pain now so we will use oxycodone temporarily.  She has been able to tolerate this medicine in the past.  Anticipate return for first gel or Zilretta injection soon.   PDMP reviewed during this encounter. No orders of the defined types were placed in this encounter.  Meds ordered this encounter  Medications   oxyCODONE-acetaminophen (PERCOCET/ROXICET) 5-325 MG tablet    Sig: Take 1 tablet by mouth every 8 (eight) hours as needed for severe pain.    Dispense:  15 tablet    Refill:  0     Discussed warning signs or symptoms. Please see discharge instructions. Patient expresses understanding.   The above documentation has been reviewed and is accurate and complete [name], M.D.

## 2021-11-03 ENCOUNTER — Other Ambulatory Visit: Payer: Self-pay | Admitting: Internal Medicine

## 2021-11-03 DIAGNOSIS — Z1231 Encounter for screening mammogram for malignant neoplasm of breast: Secondary | ICD-10-CM

## 2021-12-14 ENCOUNTER — Encounter: Payer: Self-pay | Admitting: Physician Assistant

## 2021-12-14 ENCOUNTER — Ambulatory Visit: Payer: Medicare PPO | Attending: Physician Assistant | Admitting: Cardiology

## 2021-12-14 VITALS — BP 120/62 | HR 86 | Ht 66.0 in | Wt 184.6 lb

## 2021-12-14 DIAGNOSIS — I25119 Atherosclerotic heart disease of native coronary artery with unspecified angina pectoris: Secondary | ICD-10-CM | POA: Diagnosis not present

## 2021-12-14 DIAGNOSIS — I10 Essential (primary) hypertension: Secondary | ICD-10-CM | POA: Diagnosis not present

## 2021-12-14 DIAGNOSIS — I5032 Chronic diastolic (congestive) heart failure: Secondary | ICD-10-CM

## 2021-12-14 DIAGNOSIS — E782 Mixed hyperlipidemia: Secondary | ICD-10-CM | POA: Diagnosis not present

## 2021-12-14 NOTE — Progress Notes (Signed)
Cardiology Office Note:    Date:  12/14/2021   ID:  [name], DOB 10-16-1945, MRN [id number]  PCP:  Biagio Borg, MD   [facility] Providers Cardiologist:  Sherren Mocha, MD Cardiology APP:  Sharmon [facility]     Referring MD: Biagio Borg, MD   Chief Complaint Coronary artery disease.   History of Present Illness:    [name] is a 76 y.o. female with a hx of:  Coronary artery disease  s/p prior inf MI in 1998 tx with stent to the RCA S/p stent LCx in 2012 S/p stent to the LAD in 2015 S/p DES x 3 to the RCA 2017 Myoview 07/2018: low risk Cath 03/2019: dRCA diff dz'd supplying small/dz'd PDA+PL br (not ideal for PCI) >> med Rx (HFpEF) heart failure with preserved ejection fraction  Diabetes mellitus  Hypertension Hyperlipidemia AAA S/p EVAR 01/2016  She presents today for a follow up of her CAD, HTN, and HFpEF. She endorses only generalized complaints of MSK concerns, lower back and bilateral knees. She denies any CP, SOB, wheezing, palpitations, orthopnea, presyncope or syncope. She endorses occasional pedal edema R >L, that she contributes to "not drinking enough water". The edema is relieved when she takes her lasix, which she takes PRN and not every day. Mild SOB with exertion, which she states is relieved by rest. She continues to stay up late most evenings and sleep throughout the day. She denies any snoring, waking not well rested, or pauses in her respirations. She lives alone, uses a cane for ambulation and is mostly sedentary secondary to her OA.   Past Medical History:  Diagnosis Date   AAA (abdominal aortic aneurysm) ([place])    a. s/p stent graft repair 01/2016.   Acute ischemic colitis ([place]) 07/29/2010   Diagnosed June, 2012 characterized by acute lower GI bleeding, spontaneously resolved.    Acute respiratory infection 06/09/2014   Acute sinus infection 05/14/2015   Allergic rhinitis, cause unspecified    Angioedema of lips 06/03/2014    Anxiety state, unspecified    Arthritis of left hip 04/16/2015   Bilateral hearing loss 07/19/2017   Chronic LBP    Coronary artery disease    a. inferior MI 1998 s/p PCI of RCA. b. stenting of Cx 04/2010. c. DES to prox LAD 05/2013. d. DES x 3 in 10/2015. // Myoview 07/2018:  EF 59, normal perfusion, low risk    Coronary artery disease involving native heart without angina pectoris 07/31/2006   Qualifier: Diagnosis of  By: [name] MD, [name]  ANGIOGRAPHIC DATA:  1. Ventriculography done in the RAO projection reveals a small wall  motion abnormality in the mid inferior wall. Ejection fraction  would be estimated at around 50%.  2. The right coronary artery has some progressive disease of about 60-  80% in the proximal mid segment. The distal vessel appears to be  50-70% narrowing althou   Degeneration of lumbar or lumbosacral intervertebral disc    Depressive disorder, not elsewhere classified    Diabetes ([place]) 11/03/2006   Qualifier: Diagnosis of  By: [name] MD, [name]    Diabetes mellitus    TYPE II   Frequent urination 05/08/2014   Gout 08/22/2013   History of endovascular stent graft for abdominal aortic aneurysm (AAA) 01/22/2016   Hyperlipidemia    Hypertension    Hypothyroidism    Iron deficiency anemia 06/13/2017   Lower GI bleed 06/2010   Diverticular bleed  Lumbar radiculopathy 05/15/2015   MENOPAUSAL DISORDER 10/29/2007   Qualifier: Diagnosis of  By: [name] MD, [name]    Myalgia 09/25/2013   Noncompliance with medications 02/26/2014   Obesity, unspecified    OTITIS MEDIA, SEROUS, CHRONIC 04/23/2007   Qualifier: Diagnosis of  By: [name] MD, [name]    Thoracic disc disease 11/19/2018   URTICARIA 09/23/2009   Qualifier: Diagnosis of  By: [name] MD, Mateo Flow A    Venous insufficiency 07/19/2017    Past Surgical History:  Procedure Laterality Date   CARDIAC CATHETERIZATION     PCI OF BOTH THE CIRCUMFLEX AND LEFT ANTERIOR DESCENDING ARTERY   CARDIAC CATHETERIZATION N/A 10/29/2015    Procedure: Coronary Stent Intervention;  Surgeon: Nelva Bush, MD;  Location: [place] [facility];  Service: Cardiovascular;  Laterality: N/A;   CARDIAC CATHETERIZATION N/A 10/29/2015   Procedure: Coronary/Graft Angiography;  Surgeon: Nelva Bush, MD;  Location: [place] [facility];  Service: Cardiovascular;  Laterality: N/A;   CARDIAC CATHETERIZATION N/A 10/29/2015   Procedure: Intravascular Pressure Wire/FFR Study;  Surgeon: Nelva Bush, MD;  Location: [place] [facility];  Service: Cardiovascular;  Laterality: N/A;   CESAREAN SECTION     ENDOVASCULAR STENT INSERTION N/A 01/22/2016   Procedure: ABDOMINAL AORTIC ENDOVASCULAR STENT GRAFT INSERTION;  Surgeon: Rosetta Posner, MD;  Location: [place];  Service: Vascular;  Laterality: N/A;   HEART STENT  04-2010  and  Jun 07, 2013   X 3   LEFT HEART CATH AND CORONARY ANGIOGRAPHY N/A 04/03/2019   Procedure: LEFT HEART CATH AND CORONARY ANGIOGRAPHY;  Surgeon: Nelva Bush, MD;  Location: [place] [facility];  Service: Cardiovascular;  Laterality: N/A;   LEFT HEART CATHETERIZATION WITH CORONARY ANGIOGRAM N/A 06/07/2013   Procedure: LEFT HEART CATHETERIZATION WITH CORONARY ANGIOGRAM;  Surgeon: [name], MD;  Location: [facility];  Service: Cardiovascular;  Laterality: N/A;   LEFT HEART CATHETERIZATION WITH CORONARY ANGIOGRAM N/A 02/25/2014   Procedure: LEFT HEART CATHETERIZATION WITH CORONARY ANGIOGRAM;  Surgeon: Troy Sine, MD;  Location: [facility] CATH LAB;  Service: Cardiovascular;  Laterality: N/A;   LUMBAR FUSION  01/2007   DR. Patrice Paradise...3-LEVEL WITH FIXATION   OOPHORECTOMY     BSO? pt.unsure   PARATHYROIDECTOMY     SPINE SURGERY     THYROIDECTOMY     TOTAL ABDOMINAL HYSTERECTOMY      Current Medications: Current Meds  Medication Sig   allopurinol (ZYLOPRIM) 300 MG tablet Take 1 tablet (300 mg total) by mouth as needed (for Gout).   aspirin 81 MG EC tablet Take 81 mg by mouth daily.    atenolol (TENORMIN) 25 MG tablet Take 1  tablet (25 mg total) by mouth daily.   atorvastatin (LIPITOR) 80 MG tablet Take 1 tablet (80 mg total) by mouth daily.   celecoxib (CELEBREX) 200 MG capsule Take 1 capsule (200 mg total) by mouth 2 (two) times daily as needed for moderate pain.   Cholecalciferol (THERA-D 2000) 50 MCG (2000 UT) TABS 1 tab by mouth once daily   fluconazole (DIFLUCAN) 150 MG tablet 1 tab by mouth every 3 days as needed   furosemide (LASIX) 40 MG tablet Take 1 tablet (40 mg total) by mouth daily. (Patient taking differently: Take 40 mg by mouth daily as needed for fluid.)   gabapentin (NEURONTIN) 300 MG capsule Take 1-2 tab by mouth at bedtime for pain and sleep   isosorbide mononitrate (IMDUR) 30 MG 24 hr tablet Take 0.5 tablets (15 mg total) by mouth  daily.   nitroGLYCERIN (NITROSTAT) 0.4 MG SL tablet Place 1 tablet (0.4 mg total) under the tongue every 5 (five) minutes as needed for chest pain.   pantoprazole (PROTONIX) 40 MG tablet Take 40 mg by mouth daily as needed (heartburn acid reflux).   potassium chloride (KLOR-CON) 10 MEQ tablet Take 1 tablet (10 mEq total) by mouth daily.   pregabalin (LYRICA) 75 MG capsule Take 75 mg by mouth daily as needed (pain).    SYNTHROID 150 MCG tablet 1 tab by mouth once daily   tiZANidine (ZANAFLEX) 2 MG tablet Take 1 tablet (2 mg total) by mouth every 6 (six) hours as needed for muscle spasms.   triamcinolone (NASACORT) 55 MCG/ACT AERO nasal inhaler Place 2 sprays into the nose as needed (congestion).     Allergies:   Crestor [rosuvastatin calcium], Prilosec [omeprazole], Miconazole nitrate, Augmentin [amoxicillin-pot clavulanate], and Doxycycline   Social History   Socioeconomic History   Marital status: Divorced    Spouse name: Not on file   Number of children: 3   Years of education: 13   Highest education level: Not on file  Occupational History   Occupation: RETIRED    Employer: [employer]    Comment: [employer]    Employer: RETIRED  Tobacco Use    Smoking status: Former    Years: 30.00    Types: Cigarettes    Quit date: 05/31/1986    Years since quitting: 35.5   Smokeless tobacco: Never  Vaping Use   Vaping Use: Never used  Substance and Sexual Activity   Alcohol use: No   Drug use: No   Sexual activity: Not Currently    Comment: 1st intercourse 76 yo-Fewer than 5 partners  Other Topics Concern   Not on file  Social History Narrative   DIVORCED   3 CHILDREN   PATIENT SIGNED A DESIGNATED PARTY RELEASE TO ALLOW HER DAUGHTER, TRAMAINE Hairston, TO HAVE ACCESS TO HER MEDICAL RECORDS/INFORMATION. [name], May 04, 2009 @ 3:27 PM   Right handed   One story home   .   Social Determinants of Health   Financial Resource Strain: Not on file  Food Insecurity: Not on file  Transportation Needs: Not on file  Physical Activity: Not on file  Stress: Not on file  Social Connections: Not on file     Family History: The patient's family history includes Arthritis in her maternal aunt; Breast cancer in her maternal aunt; Diabetes in her brother and maternal aunt; Heart attack (age of onset: 69) in her mother; Heart attack (age of onset: 61) in her father; Heart disease in her father and mother. There is no history of Colon cancer.  ROS:   Review of Systems  Constitutional: Negative.   HENT:  Positive for hearing loss.   Eyes:  Negative for blurred vision and double vision.  Respiratory:  Negative for cough, shortness of breath and wheezing.   Cardiovascular:  Positive for leg swelling (intermittently R>L). Negative for chest pain, palpitations and orthopnea.  Gastrointestinal:  Positive for heartburn (occasional that is relieved by carbonated beverage). Negative for abdominal pain, blood in stool and nausea.  Genitourinary: Negative.   Musculoskeletal:  Positive for back pain, joint pain and myalgias.  Skin: Negative.   Neurological:  Negative for dizziness, loss of consciousness and headaches.  Endo/Heme/Allergies: Negative.    Psychiatric/Behavioral: Negative.       EKGs/Labs/Other Studies Reviewed:    The following studies were reviewed today: none  EKG:  EKG is not ordered today.  The ekg ordered today demonstrates n/a  Recent Labs: 06/01/2021: NT-Pro BNP 214 06/08/2021: BUN 4; Creatinine, Ser 0.69; Potassium 3.9; Sodium 141 06/09/2021: ALT 7; Hemoglobin 13.2; Platelets 300.0; TSH 4.19  Recent Lipid Panel    Component Value Date/Time   CHOL 162 02/09/2021 1514   CHOL 143 10/30/2020 1211   TRIG 93.0 02/09/2021 1514   TRIG 102 01/02/2006 0825   HDL 37.90 (L) 02/09/2021 1514   HDL 25 (L) 10/30/2020 1211   CHOLHDL 4 02/09/2021 1514   VLDL 18.6 02/09/2021 1514   LDLCALC 106 (H) 02/09/2021 1514   LDLCALC 94 10/30/2020 1211   LDLDIRECT 131.0 03/27/2015 1137     Risk Assessment/Calculations:                Physical Exam:    VS:  BP 120/62   Pulse 86   Ht '5\' 6"'$  (1.676 m)   Wt 184 lb 9.6 oz (83.7 kg)   SpO2 99%   BMI 29.80 kg/m     Wt Readings from Last 3 Encounters:  12/14/21 184 lb 9.6 oz (83.7 kg)  10/25/21 186 lb (84.4 kg)  10/11/21 182 lb (82.6 kg)     GEN:  Well nourished, well developed in no acute distress HEENT: Normal NECK: No JVD; No carotid bruits LYMPHATICS: No lymphadenopathy CARDIAC: RRR, no murmurs, rubs, gallops RESPIRATORY:  Clear to auscultation without rales, wheezing or rhonchi  ABDOMEN: Soft, non-tender, non-distended MUSCULOSKELETAL:  +1 pitting edema R > L; No deformity  SKIN: Warm and dry NEUROLOGIC:  Alert and oriented x 3 PSYCHIATRIC:  Normal affect   ASSESSMENT:    CAD Chronic diastolic HF Mixed hyperlipidemia Essential hypertension  PLAN:    Stable, no chest pain. Continue ASA, atenolol, imdur, and lipitor. Pt did not bring a medication list or bottles, LDL 106 02/09/21 and questions about compliance with lipitor. Patient is not sure if she is taking, but says she is compliant with all meds as prescribed. Encouraged to bring list or bottles at  next appt. Euvolemic, NYHA 2. Most recent echo LVEF 55%. Is not taking her lasix daily, only as needed for pedal edema, taken ~ twice over the last "few months".  Tries to eat fresh, healthy foods. Should be re-checked at next visit with PCP in January. Recent LDL 106, some confusion if she is taking her lipitor or not. Encouraged her to take bottles and/or list to her next appt.  BP 120/62 today, well controlled on current medication regimen.             Medication Adjustments/Labs and Tests Ordered: Current medicines are reviewed at length with the patient today.  Concerns regarding medicines are outlined above.  No orders of the defined types were placed in this encounter.  No orders of the defined types were placed in this encounter.   Patient Instructions  Medication Instructions:  Your physician recommends that you continue on your current medications as directed. Please refer to the Current Medication list given to you today.  *If you need a refill on your cardiac medications before your next appointment, please call your pharmacy*   Lab Work: None ordered  If you have labs (blood work) drawn today and your tests are completely normal, you will receive your results only by: [place] (if you have MyChart) OR A paper copy in the mail If you have any lab test that is abnormal or we need to change your treatment, we will  call you to review the results.   Testing/Procedures: None ordered   Follow-Up: At [facility], you and your health needs are our priority.  As part of our continuing mission to provide you with exceptional heart care, we have created designated Provider Care Teams.  These Care Teams include your primary Cardiologist (physician) and Advanced Practice Providers (APPs -  Physician Assistants and Nurse Practitioners) who all work together to provide you with the care you need, when you need it.  We recommend signing up for the patient portal  called "MyChart".  Sign up information is provided on this After Visit Summary.  MyChart is used to connect with patients for Virtual Visits (Telemedicine).  Patients are able to view lab/test results, encounter notes, upcoming appointments, etc.  Non-urgent messages can be sent to your provider as well.   To learn more about what you can do with MyChart, go to [email or web address].    Your next appointment:   6 month(s)  The format for your next appointment:   In Person  Provider:   Sherren Mocha, MD  or Richardson Dopp, PA-C         Other Instructions   Important Information About Sugar          Signed, Trudi Ida, NP  12/14/2021 4:35 PM    [place]

## 2021-12-14 NOTE — Patient Instructions (Addendum)
Medication Instructions:  Your physician recommends that you continue on your current medications as directed. Please refer to the Current Medication list given to you today.  *If you need a refill on your cardiac medications before your next appointment, please call your pharmacy*   Lab Work: None ordered  If you have labs (blood work) drawn today and your tests are completely normal, you will receive your results only by: [place] (if you have MyChart) OR A paper copy in the mail If you have any lab test that is abnormal or we need to change your treatment, we will call you to review the results.   Testing/Procedures: None ordered   Follow-Up: At [facility], you and your health needs are our priority.  As part of our continuing mission to provide you with exceptional heart care, we have created designated Provider Care Teams.  These Care Teams include your primary Cardiologist (physician) and Advanced Practice Providers (APPs -  Physician Assistants and Nurse Practitioners) who all work together to provide you with the care you need, when you need it.  We recommend signing up for the patient portal called "MyChart".  Sign up information is provided on this After Visit Summary.  MyChart is used to connect with patients for Virtual Visits (Telemedicine).  Patients are able to view lab/test results, encounter notes, upcoming appointments, etc.  Non-urgent messages can be sent to your provider as well.   To learn more about what you can do with MyChart, go to [email or web address].    Your next appointment:   6 month(s)  The format for your next appointment:   In Person  Provider:   Sherren Mocha, MD  or Richardson Dopp, PA-C         Other Instructions   Important Information About Sugar

## 2021-12-16 ENCOUNTER — Ambulatory Visit: Payer: Medicare PPO | Admitting: Family Medicine

## 2021-12-22 ENCOUNTER — Ambulatory Visit: Payer: Medicare PPO

## 2021-12-22 ENCOUNTER — Ambulatory Visit
Admission: RE | Admit: 2021-12-22 | Discharge: 2021-12-22 | Disposition: A | Discharge status: Home / Self Care | Payer: Medicare PPO | Source: Ambulatory Visit | Attending: Internal Medicine | Admitting: Internal Medicine

## 2021-12-22 DIAGNOSIS — Z1231 Encounter for screening mammogram for malignant neoplasm of breast: Secondary | ICD-10-CM

## 2021-12-23 ENCOUNTER — Ambulatory Visit: Payer: Self-pay

## 2021-12-23 ENCOUNTER — Ambulatory Visit (INDEPENDENT_AMBULATORY_CARE_PROVIDER_SITE_OTHER): Payer: Medicare PPO | Admitting: Family Medicine

## 2021-12-23 ENCOUNTER — Other Ambulatory Visit: Payer: Self-pay | Admitting: *Deleted

## 2021-12-23 VITALS — BP 116/62 | Ht 66.0 in | Wt 188.0 lb

## 2021-12-23 DIAGNOSIS — G8929 Other chronic pain: Secondary | ICD-10-CM

## 2021-12-23 DIAGNOSIS — M170 Bilateral primary osteoarthritis of knee: Secondary | ICD-10-CM | POA: Diagnosis not present

## 2021-12-23 DIAGNOSIS — I714 Abdominal aortic aneurysm, without rupture, unspecified: Secondary | ICD-10-CM

## 2021-12-23 DIAGNOSIS — M25562 Pain in left knee: Secondary | ICD-10-CM | POA: Diagnosis not present

## 2021-12-23 DIAGNOSIS — M25561 Pain in right knee: Secondary | ICD-10-CM

## 2021-12-23 MED ORDER — TRIAMCINOLONE ACETONIDE 32 MG IX SRER
64.0000 mg | Freq: Once | INTRA_ARTICULAR | Status: AC
  Administered 2021-12-23: 64 mg via INTRA_ARTICULAR

## 2021-12-23 NOTE — Progress Notes (Signed)
I, [name], LAT, ATC acting as a scribe for Lynne Leader, MD.  [name] is a 76 y.o. female who presents to [place] at [facility] today for cont'd chronic bilat knee pain. Pt was last seen by Dr. 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 Today, pt reports both knees are really hurting. Pt is wanting to try the Zilretta today. She rates her pain as severe.   Dx imaging: 11/21/19 R & L knee XR   Pertinent review of systems: No fevers or chills  Relevant historical information: Heart failure.  Peripheral vascular disease.   Exam:  BP 116/62   Ht '5\' 6"'$  (1.676 m)   Wt 188 lb (85.3 kg)   BMI 30.34 kg/m  General: Well Developed, well nourished, and in no acute distress.   MSK: Knees bilaterally mild effusion.  Decreased range of motion. Nontender. Antalgic gait.    Lab and Radiology Results  Bilateral Zilretta injections  Procedure: Real-time Ultrasound Guided Injection of right knee superior lateral patellar space Device: Philips Affiniti 50G Images permanently stored and available for review in PACS Verbal informed consent obtained.  Discussed risks and benefits of procedure. Warned about infection, bleeding, hyperglycemia damage to structures among others. Patient expresses understanding and agreement Time-out conducted.   Noted no overlying erythema, induration, or other signs of local infection.   Skin prepped in a sterile fashion.   Local anesthesia: Topical Ethyl chloride.   With sterile technique and under real time ultrasound guidance: Zilretta 32 mg injected into knee joint. Fluid seen entering the joint capsule.   Completed without difficulty   Advised to call if fevers/chills, erythema, induration, drainage, or persistent bleeding.   Images permanently stored and available for review in the ultrasound unit.  Impression: Technically successful ultrasound guided  injection.    Procedure: Real-time Ultrasound Guided Injection of left knee superior lateral patellar space Device: Philips Affiniti 50G Images permanently stored and available for review in PACS Verbal informed consent obtained.  Discussed risks and benefits of procedure. Warned about infection, bleeding, hyperglycemia damage to structures among others. Patient expresses understanding and agreement Time-out conducted.   Noted no overlying erythema, induration, or other signs of local infection.   Skin prepped in a sterile fashion.   Local anesthesia: Topical Ethyl chloride.   With sterile technique and under real time ultrasound guidance: Zilretta 32 mg injected into knee joint. Fluid seen entering the joint capsule.   Completed without difficulty   Advised to call if fevers/chills, erythema, induration, drainage, or persistent bleeding.   Images permanently stored and available for review in the ultrasound unit.  Impression: Technically successful ultrasound guided injection. Lot number: [id number] both injections      Assessment and Plan: 76 y.o. female with bilateral knee pain due to significant DJD.  Plan for Zilretta injection.  If this is not sufficient is not much else left to do.  At that point she probably would benefit from a knee replacement.  She is not excited about the idea of knee replacement at all.   PDMP reviewed during this encounter. Orders Placed This Encounter  Procedures   [place] LIMITED JOINT SPACE STRUCTURES LOW BILAT(NO LINKED CHARGES)    Order Specific Question:   Reason for Exam (SYMPTOM  OR DIAGNOSIS REQUIRED)    Answer:   bilateral knee pain    Order Specific Question:   Preferred imaging location?    Answer:   [facility]   Meds ordered this encounter  Medications   Triamcinolone Acetonide (ZILRETTA) intra-articular injection 64 mg     Discussed warning signs or symptoms. Please see discharge instructions. Patient expresses  understanding.   The above documentation has been reviewed and is accurate and complete Lynne Leader, M.D.

## 2021-12-23 NOTE — Patient Instructions (Signed)
Thank you for coming in today.   Call or go to the ER if you develop a large red swollen joint with extreme pain or oozing puss.    Recheck as needed.    

## 2021-12-30 ENCOUNTER — Ambulatory Visit: Payer: Medicare PPO

## 2021-12-30 ENCOUNTER — Ambulatory Visit (HOSPITAL_COMMUNITY): Payer: Medicare PPO

## 2022-01-05 NOTE — Progress Notes (Unsigned)
Office Note     CC:  follow up Requesting Provider:  Biagio Borg, MD  HPI: [name] is [name] 76 y.o. (26-May-1945) female who presents for surveillance follow up of EVAR. Remote history of EVAR in March of 2018 by Dr. Donnetta [name].   She does not have any new back or abdominal pain. Patient does walk with the help of [name] cane.   The pt is on [name] statin for cholesterol management.  The pt is on [name] daily aspirin.   Other AC:  none The pt is on BB for hypertension.   The pt is not diabetic.   Tobacco hx:  Former  Past Medical History:  Diagnosis Date   AAA (abdominal aortic aneurysm) ([place])    [name]. s/p stent graft repair 01/2016.   Acute ischemic colitis ([place]) 07/29/2010   Diagnosed June, 2012 characterized by acute lower GI bleeding, spontaneously resolved.    Acute respiratory infection 06/09/2014   Acute sinus infection 05/14/2015   Allergic rhinitis, cause unspecified    Angioedema of lips 06/03/2014   Anxiety state, unspecified    Arthritis of left hip 04/16/2015   Bilateral hearing loss 07/19/2017   Chronic LBP    Coronary artery disease    [name]. inferior MI 1998 s/p PCI of RCA. b. stenting of Cx 04/2010. c. DES to prox LAD 05/2013. d. DES x 3 in 10/2015. // Myoview 07/2018:  EF 59, normal perfusion, low risk    Coronary artery disease involving native heart without angina pectoris 07/31/2006   Qualifier: Diagnosis of  By: Brandi Hedges MD, [name]  ANGIOGRAPHIC DATA:  1. Ventriculography done in the RAO projection reveals [name] small wall  motion abnormality in the mid inferior wall. Ejection fraction  would be estimated at around 50%.  2. The right coronary artery has some progressive disease of about 60-  80% in the proximal mid segment. The distal vessel appears to be  50-70% narrowing althou   Degeneration of lumbar or lumbosacral intervertebral disc    Depressive disorder, not elsewhere classified    Diabetes ([place]) 11/03/2006   Qualifier: Diagnosis of  By: Brandi Reichmann MD, [name]    Diabetes mellitus    TYPE  II   Frequent urination 05/08/2014   Gout 08/22/2013   History of endovascular stent graft for abdominal aortic aneurysm (AAA) 01/22/2016   Hyperlipidemia    Hypertension    Hypothyroidism    Iron deficiency anemia 06/13/2017   Lower GI bleed 06/2010   Diverticular bleed   Lumbar radiculopathy 05/15/2015   MENOPAUSAL DISORDER 10/29/2007   Qualifier: Diagnosis of  By: Brandi Reichmann MD, [name]    Myalgia 09/25/2013   Noncompliance with medications 02/26/2014   Obesity, unspecified    OTITIS MEDIA, SEROUS, CHRONIC 04/23/2007   Qualifier: Diagnosis of  By: Brandi Hedges MD, [name]    Thoracic disc disease 11/19/2018   URTICARIA 09/23/2009   Qualifier: Diagnosis of  By: Brandi Lente MD, [name]    Venous insufficiency 07/19/2017    Past Surgical History:  Procedure Laterality Date   CARDIAC CATHETERIZATION     PCI OF BOTH THE CIRCUMFLEX AND LEFT ANTERIOR DESCENDING ARTERY   CARDIAC CATHETERIZATION N/[name] 10/29/2015   Procedure: Coronary Stent Intervention;  Surgeon: Brandi Bush, MD;  Location: [facility];  Service: Cardiovascular;  Laterality: N/[name];   CARDIAC CATHETERIZATION N/[name] 10/29/2015   Procedure: Coronary/Graft Angiography;  Surgeon: Brandi Bush, MD;  Location: [place] [facility];  Service: Cardiovascular;  Laterality: N/[name];  CARDIAC CATHETERIZATION N/[name] 10/29/2015   Procedure: Intravascular Pressure Wire/FFR Study;  Surgeon: Brandi Bush, MD;  Location: [facility] [facility];  Service: Cardiovascular;  Laterality: N/[name];   CESAREAN SECTION     ENDOVASCULAR STENT INSERTION N/[name] 01/22/2016   Procedure: ABDOMINAL AORTIC ENDOVASCULAR STENT GRAFT INSERTION;  Surgeon: Brandi Posner, MD;  Location: [place] [facility];  Service: Vascular;  Laterality: N/[name];   HEART STENT  04-2010  and  Jun 07, 2013   X 3   LEFT HEART CATH AND CORONARY ANGIOGRAPHY N/[name] 04/03/2019   Procedure: LEFT HEART CATH AND CORONARY ANGIOGRAPHY;  Surgeon: Brandi Bush, MD;  Location: [facility];  Service: Cardiovascular;  Laterality: N/[name];    LEFT HEART CATHETERIZATION WITH CORONARY ANGIOGRAM N/[name] 06/07/2013   Procedure: LEFT HEART CATHETERIZATION WITH CORONARY ANGIOGRAM;  Surgeon: [name], MD;  Location: [facility];  Service: Cardiovascular;  Laterality: N/[name];   LEFT HEART CATHETERIZATION WITH CORONARY ANGIOGRAM N/[name] 02/25/2014   Procedure: LEFT HEART CATHETERIZATION WITH CORONARY ANGIOGRAM;  Surgeon: Troy Sine, MD;  Location: [facility];  Service: Cardiovascular;  Laterality: N/[name];   LUMBAR FUSION  01/2007   DR. Patrice [name]...3-LEVEL WITH FIXATION   OOPHORECTOMY     BSO? pt.unsure   PARATHYROIDECTOMY     SPINE SURGERY     THYROIDECTOMY     TOTAL ABDOMINAL HYSTERECTOMY      Social History   Socioeconomic History   Marital status: Divorced    Spouse name: Not on file   Number of children: 3   Years of education: 13   Highest education level: Not on file  Occupational History   Occupation: RETIRED    Employer: [name]    Comment: [employer]    Employer: RETIRED  Tobacco Use   Smoking status: Former    Years: 30.00    Types: Cigarettes    Quit date: 05/31/1986    Years since quitting: 35.6   Smokeless tobacco: Never  Vaping Use   Vaping Use: Never used  Substance and Sexual Activity   Alcohol use: No   Drug use: No   Sexual activity: Not Currently    Comment: 1st intercourse 76 yo-Fewer than 5 partners  Other Topics Concern   Not on file  Social History Narrative   DIVORCED   3 CHILDREN   PATIENT SIGNED [name] DESIGNATED PARTY RELEASE TO ALLOW HER DAUGHTER, [name], TO HAVE ACCESS TO HER MEDICAL RECORDS/INFORMATION. [name], May 04, 2009 @ 3:27 PM   Right handed   One story home   .   Social Determinants of Health   Financial Resource Strain: Not on file  Food Insecurity: Not on file  Transportation Needs: Not on file  Physical Activity: Not on file  Stress: Not on file  Social Connections: Not on file  Intimate Partner Violence: Not on file   *** Family History   Problem Relation Age of Onset   Heart attack Mother 77       s/p D&C-CARDIAC ARREST 1966   Heart disease Mother    Heart attack Father 32       27 WITH MI   Heart disease Father    Diabetes Brother    Diabetes Maternal Aunt    Arthritis Maternal Aunt    Breast cancer Maternal Aunt        Post menopausal   Colon cancer Neg Hx     Current Outpatient Medications  Medication Sig Dispense Refill   allopurinol (  ZYLOPRIM) 300 MG tablet Take 1 tablet (300 mg total) by mouth as needed (for Gout). 90 tablet 3   aspirin 81 MG EC tablet Take 81 mg by mouth daily.      atenolol (TENORMIN) 25 MG tablet Take 1 tablet (25 mg total) by mouth daily. 90 tablet 3   atorvastatin (LIPITOR) 80 MG tablet Take 1 tablet (80 mg total) by mouth daily.  1   celecoxib (CELEBREX) 200 MG capsule Take 1 capsule (200 mg total) by mouth 2 (two) times daily as needed for moderate pain. 180 capsule 1   Cholecalciferol (THERA-D 2000) 50 MCG (2000 UT) TABS 1 tab by mouth once daily 90 tablet 99   fluconazole (DIFLUCAN) 150 MG tablet 1 tab by mouth every 3 days as needed 2 tablet 1   furosemide (LASIX) 40 MG tablet Take 1 tablet (40 mg total) by mouth daily. (Patient taking differently: Take 40 mg by mouth daily as needed for fluid.) 90 tablet 3   gabapentin (NEURONTIN) 300 MG capsule Take 1-2 tab by mouth at bedtime for pain and sleep 180 capsule 1   isosorbide mononitrate (IMDUR) 30 MG 24 hr tablet Take 0.5 tablets (15 mg total) by mouth daily. 45 tablet 3   nitroGLYCERIN (NITROSTAT) 0.4 MG SL tablet Place 1 tablet (0.4 mg total) under the tongue every 5 (five) minutes as needed for chest pain. 25 tablet 3   pantoprazole (PROTONIX) 40 MG tablet Take 40 mg by mouth daily as needed (heartburn acid reflux).     potassium chloride (KLOR-CON) 10 MEQ tablet Take 1 tablet (10 mEq total) by mouth daily. 90 tablet 3   pregabalin (LYRICA) 75 MG capsule Take 75 mg by mouth daily as needed (pain).      SYNTHROID 150 MCG tablet 1  tab by mouth once daily 90 tablet 3   tiZANidine (ZANAFLEX) 2 MG tablet Take 1 tablet (2 mg total) by mouth every 6 (six) hours as needed for muscle spasms. 40 tablet 1   triamcinolone (NASACORT) 55 MCG/ACT AERO nasal inhaler Place 2 sprays into the nose as needed (congestion). 1 each 12   No current facility-administered medications for this visit.    Allergies  Allergen Reactions   Crestor [Rosuvastatin Calcium] Other (See Comments)    Feeling poor   Prilosec [Omeprazole] Other (See Comments)    Chest pain   Miconazole Nitrate Hives    REACTION: hives   Augmentin [Amoxicillin-Pot Clavulanate] Hives, Itching and Rash    Has patient had [name] PCN reaction causing immediate rash, facial/tongue/throat swelling, SOB or lightheadedness with hypotension:unsure Has patient had [name] PCN reaction causing severe rash involving mucus membranes or skin necrosis:unsure Has patient had [name] PCN reaction that required hospitalization:No Has patient had [name] PCN reaction occurring within the last 10 years:NO If all of the above answers are "NO", then may proceed with Cephalosporin use. Has patient had [name] PCN reaction causing immediate rash, facial/tongue/throat swelling, SOB or lightheadedness with hypotension:unsure Has patient had [name] PCN reaction causing severe rash involving mucus membranes or skin necrosis:unsure Has patient had [name] PCN reaction that required hospitalization:No Has patient had [name] PCN reaction occurring within the last 10 years:NO If all of the above answers are "NO", then may proceed with Cephalosporin use.    Doxycycline Other (See Comments)    REACTION: gi upset     REVIEW OF SYSTEMS:  *** '[X]'$  denotes positive finding, '[ ]'$  denotes negative finding Cardiac  Comments:  Chest pain or chest pressure:  Shortness of breath upon exertion:    Short of breath when lying flat:    Irregular heart rhythm:        Vascular    Pain in calf, thigh, or hip brought on by ambulation:    Pain in feet  at night that wakes you up from your sleep:     Blood clot in your veins:    Leg swelling:         Pulmonary    Oxygen at home:    Productive cough:     Wheezing:         Neurologic    Sudden weakness in arms or legs:     Sudden numbness in arms or legs:     Sudden onset of difficulty speaking or slurred speech:    Temporary loss of vision in one eye:     Problems with dizziness:         Gastrointestinal    Blood in stool:     Vomited blood:         Genitourinary    Burning when urinating:     Blood in urine:        Psychiatric    Major depression:         Hematologic    Bleeding problems:    Problems with blood clotting too easily:        Skin    Rashes or ulcers:        Constitutional    Fever or chills:      PHYSICAL EXAMINATION:  There were no vitals filed for this visit.  General:  WDWN in NAD; vital signs documented above Gait: Not observed HENT: WNL, normocephalic Pulmonary: normal non-labored breathing , without Rales, rhonchi,  wheezing Cardiac: {Desc; regular/irreg:14544} HR, without  Murmurs {With/Without:20273} carotid bruit*** Abdomen: soft, NT, no masses Skin: {With/Without:20273} rashes Vascular Exam/Pulses:  Right Left  Radial {Exam; arterial pulse strength 0-4:30167} {Exam; arterial pulse strength 0-4:30167}  Ulnar {Exam; arterial pulse strength 0-4:30167} {Exam; arterial pulse strength 0-4:30167}  Femoral {Exam; arterial pulse strength 0-4:30167} {Exam; arterial pulse strength 0-4:30167}  Popliteal {Exam; arterial pulse strength 0-4:30167} {Exam; arterial pulse strength 0-4:30167}  DP {Exam; arterial pulse strength 0-4:30167} {Exam; arterial pulse strength 0-4:30167}  PT {Exam; arterial pulse strength 0-4:30167} {Exam; arterial pulse strength 0-4:30167}   Extremities: {With/Without:20273} ischemic changes, {With/Without:20273} Gangrene , {With/Without:20273} cellulitis; {With/Without:20273} open wounds;  Musculoskeletal: no muscle wasting  or atrophy  Neurologic: [name]&O X 3;  No focal weakness or paresthesias are detected Psychiatric:  The pt has {Desc; normal/abnormal:11317::"Normal"} affect.   Non-Invasive Vascular Imaging:   ***    ASSESSMENT/PLAN:: 76 y.o. female here for follow up for ***   -***   Karoline Caldwell, PA-C [facility] MD:  Donzetta Matters

## 2022-01-06 ENCOUNTER — Ambulatory Visit (HOSPITAL_COMMUNITY): Payer: Medicare PPO | Attending: Vascular Surgery

## 2022-01-06 ENCOUNTER — Ambulatory Visit: Payer: Medicare PPO

## 2022-01-06 DIAGNOSIS — I714 Abdominal aortic aneurysm, without rupture, unspecified: Secondary | ICD-10-CM

## 2022-01-26 ENCOUNTER — Encounter: Payer: Self-pay | Admitting: Physician Assistant

## 2022-01-26 ENCOUNTER — Ambulatory Visit (INDEPENDENT_AMBULATORY_CARE_PROVIDER_SITE_OTHER): Payer: Medicare PPO | Admitting: Physician Assistant

## 2022-01-26 ENCOUNTER — Ambulatory Visit (HOSPITAL_COMMUNITY)
Admission: RE | Admit: 2022-01-26 | Discharge: 2022-01-26 | Disposition: A | Discharge status: Home / Self Care | Payer: Medicare PPO | Source: Ambulatory Visit | Attending: Vascular Surgery | Admitting: Vascular Surgery

## 2022-01-26 VITALS — BP 113/69 | HR 62 | Temp 97.7°F | Ht 66.0 in | Wt 174.0 lb

## 2022-01-26 DIAGNOSIS — I714 Abdominal aortic aneurysm, without rupture, unspecified: Secondary | ICD-10-CM | POA: Insufficient documentation

## 2022-01-26 NOTE — Progress Notes (Signed)
Office Note   History of Present Illness   [name] is a 77 y.o. (1945/08/19) female who presents for follow-up of AAA.  She is s/p EVAR in 2018.  She is a former smoker many years ago.  She presents to our office for surveillance.  She denies any abdominal or back pain.  She denies any claudication, rest pain, wounds of the lower extremities.  She denies any changes to her health.  She is still taking her aspirin and statin.  Current Outpatient Medications  Medication Sig Dispense Refill   allopurinol (ZYLOPRIM) 300 MG tablet Take 1 tablet (300 mg total) by mouth as needed (for Gout). 90 tablet 3   aspirin 81 MG EC tablet Take 81 mg by mouth daily.      atenolol (TENORMIN) 25 MG tablet Take 1 tablet (25 mg total) by mouth daily. 90 tablet 3   atorvastatin (LIPITOR) 80 MG tablet Take 1 tablet (80 mg total) by mouth daily.  1   celecoxib (CELEBREX) 200 MG capsule Take 1 capsule (200 mg total) by mouth 2 (two) times daily as needed for moderate pain. 180 capsule 1   Cholecalciferol (THERA-D 2000) 50 MCG (2000 UT) TABS 1 tab by mouth once daily 90 tablet 99   fluconazole (DIFLUCAN) 150 MG tablet 1 tab by mouth every 3 days as needed 2 tablet 1   furosemide (LASIX) 40 MG tablet Take 1 tablet (40 mg total) by mouth daily. (Patient taking differently: Take 40 mg by mouth daily as needed for fluid.) 90 tablet 3   gabapentin (NEURONTIN) 300 MG capsule Take 1-2 tab by mouth at bedtime for pain and sleep 180 capsule 1   isosorbide mononitrate (IMDUR) 30 MG 24 hr tablet Take 0.5 tablets (15 mg total) by mouth daily. 45 tablet 3   nitroGLYCERIN (NITROSTAT) 0.4 MG SL tablet Place 1 tablet (0.4 mg total) under the tongue every 5 (five) minutes as needed for chest pain. 25 tablet 3   pantoprazole (PROTONIX) 40 MG tablet Take 40 mg by mouth daily as needed (heartburn acid reflux).     potassium chloride (KLOR-CON) 10 MEQ tablet Take 1 tablet (10 mEq total) by mouth daily. 90 tablet 3   pregabalin  (LYRICA) 75 MG capsule Take 75 mg by mouth daily as needed (pain).      SYNTHROID 150 MCG tablet 1 tab by mouth once daily 90 tablet 3   tiZANidine (ZANAFLEX) 2 MG tablet Take 1 tablet (2 mg total) by mouth every 6 (six) hours as needed for muscle spasms. 40 tablet 1   triamcinolone (NASACORT) 55 MCG/ACT AERO nasal inhaler Place 2 sprays into the nose as needed (congestion). 1 each 12   No current facility-administered medications for this visit.    REVIEW OF SYSTEMS (negative unless checked):   Cardiac:  '[]'$  Chest pain or chest pressure? '[]'$  Shortness of breath upon activity? '[]'$  Shortness of breath when lying flat? '[]'$  Irregular heart rhythm?  Vascular:  '[]'$  Pain in calf, thigh, or hip brought on by walking? '[]'$  Pain in feet at night that wakes you up from your sleep? '[]'$  Blood clot in your veins? '[]'$  Leg swelling?  Pulmonary:  '[]'$  Oxygen at home? '[]'$  Productive cough? '[]'$  Wheezing?  Neurologic:  '[]'$  Sudden weakness in arms or legs? '[]'$  Sudden numbness in arms or legs? '[]'$  Sudden onset of difficult speaking or slurred speech? '[]'$  Temporary loss of vision in one eye? '[]'$  Problems with dizziness?  Gastrointestinal:  '[]'$  Blood  in stool? '[]'$  Vomited blood?  Genitourinary:  '[]'$  Burning when urinating? '[]'$  Blood in urine?  Psychiatric:  '[]'$  Major depression  Hematologic:  '[]'$  Bleeding problems? '[]'$  Problems with blood clotting?  Dermatologic:  '[]'$  Rashes or ulcers?  Constitutional:  '[]'$  Fever or chills?  Ear/Nose/Throat:  '[]'$  Change in hearing? '[]'$  Nose bleeds? '[]'$  Sore throat?  Musculoskeletal:  '[]'$  Back pain? '[]'$  Joint pain? '[]'$  Muscle pain?   Physical Examination   Vitals:   01/26/22 0923 01/26/22 0924  BP: 115/66 113/69  Pulse: 62   Temp: 97.7 F (36.5 C)   TempSrc: Temporal   SpO2: 96%   Weight: 174 lb (78.9 kg)   Height: '5\' 6"'$  (1.676 m)    Body mass index is 28.08 kg/m.  General:  WDWN in NAD; vital signs documented above Gait: Not observed HENT: WNL,  normocephalic Pulmonary: normal non-labored breathing  Cardiac: Regular rate and rhythm, no palpable abdominal pulse Abdomen: soft, NT, no masses Skin: without rashes Vascular Exam/Pulses: Bilateral lower extremities well-perfused Extremities: without ischemic changes, without Gangrene , without cellulitis; without open wounds;  Musculoskeletal: no muscle wasting or atrophy  Neurologic: A&O X 3;  No focal weakness or paresthesias are detected Psychiatric:  The pt has Normal affect.   Non-Invasive Vascular Imaging   AAA Duplex (01/26/2022) Current size: 4.36 cm Previous size: 4.2 cm (09/04/2020) R CIA: 2.24 cm L CIA: 1.7 cm   Medical Decision Making   [name] is a 77 y.o. (27-Dec-1945) female who presents for surveillance of AAA s/p EVAR in 2018  Based on this patient's duplex study, the maximum size of her aorta is 4.36 cm.  Previous maximum measurement in 2022 was 4.2 cm.  No sign of endoleak on duplex She denies any abdominal or back pain.  She is still compliant with her aspirin and statin Based off previous measurements, maximum diameter of the aorta has appeared to have grown about 1 cm in the past 4 years.  2020 measurement had a maximum diameter of 3.3 cm.  I explained to the patient that she may warrant further workup in the future if the diameter continues to increase She can follow-up with our office in 1 year with repeat EVAR duplex   Vicente Serene PA-C [facility] Office: [facility] MD: Cain/Dickson

## 2022-02-10 ENCOUNTER — Ambulatory Visit (INDEPENDENT_AMBULATORY_CARE_PROVIDER_SITE_OTHER): Payer: Medicare PPO | Admitting: Internal Medicine

## 2022-02-10 ENCOUNTER — Other Ambulatory Visit: Payer: Self-pay | Admitting: Internal Medicine

## 2022-02-10 VITALS — BP 118/64 | HR 60 | Temp 97.9°F | Ht 66.0 in | Wt 179.0 lb

## 2022-02-10 DIAGNOSIS — I10 Essential (primary) hypertension: Secondary | ICD-10-CM | POA: Diagnosis not present

## 2022-02-10 DIAGNOSIS — H6691 Otitis media, unspecified, right ear: Secondary | ICD-10-CM | POA: Diagnosis not present

## 2022-02-10 DIAGNOSIS — E039 Hypothyroidism, unspecified: Secondary | ICD-10-CM

## 2022-02-10 DIAGNOSIS — D509 Iron deficiency anemia, unspecified: Secondary | ICD-10-CM

## 2022-02-10 DIAGNOSIS — M545 Low back pain, unspecified: Secondary | ICD-10-CM

## 2022-02-10 DIAGNOSIS — E559 Vitamin D deficiency, unspecified: Secondary | ICD-10-CM

## 2022-02-10 DIAGNOSIS — Z0001 Encounter for general adult medical examination with abnormal findings: Secondary | ICD-10-CM | POA: Diagnosis not present

## 2022-02-10 DIAGNOSIS — E1165 Type 2 diabetes mellitus with hyperglycemia: Secondary | ICD-10-CM | POA: Diagnosis not present

## 2022-02-10 DIAGNOSIS — E538 Deficiency of other specified B group vitamins: Secondary | ICD-10-CM

## 2022-02-10 DIAGNOSIS — G8929 Other chronic pain: Secondary | ICD-10-CM

## 2022-02-10 LAB — URINALYSIS, ROUTINE W REFLEX MICROSCOPIC
Bilirubin Urine: NEGATIVE
Hgb urine dipstick: NEGATIVE
Ketones, ur: NEGATIVE
Nitrite: POSITIVE — AB
RBC / HPF: NONE SEEN (ref 0–?)
Specific Gravity, Urine: <=1.005 — AB (ref 1.000–1.030)
Total Protein, Urine: NEGATIVE
Urine Glucose: NEGATIVE
Urobilinogen, UA: 1 (ref 0.0–1.0)
pH: 7 (ref 5.0–8.0)

## 2022-02-10 LAB — CBC WITH DIFFERENTIAL/PLATELET
Basophils Absolute: 0.1 10*3/uL (ref 0.0–0.1)
Basophils Relative: 0.8 % (ref 0.0–3.0)
Eosinophils Absolute: 0.1 10*3/uL (ref 0.0–0.7)
Eosinophils Relative: 1.9 % (ref 0.0–5.0)
HCT: 42.6 % (ref 36.0–46.0)
Hemoglobin: 13.8 g/dL (ref 12.0–15.0)
Lymphocytes Relative: 27.8 % (ref 12.0–46.0)
Lymphs Abs: 2 10*3/uL (ref 0.7–4.0)
MCHC: 32.4 g/dL (ref 30.0–36.0)
MCV: 87.6 fl (ref 78.0–100.0)
Monocytes Absolute: 0.6 10*3/uL (ref 0.1–1.0)
Monocytes Relative: 8.2 % (ref 3.0–12.0)
Neutro Abs: 4.4 10*3/uL (ref 1.4–7.7)
Neutrophils Relative %: 61.3 % (ref 43.0–77.0)
Platelets: 274 10*3/uL (ref 150.0–400.0)
RBC: 4.86 Mil/uL (ref 3.87–5.11)
RDW: 14.5 % (ref 11.5–15.5)
WBC: 7.1 10*3/uL (ref 4.0–10.5)

## 2022-02-10 LAB — LIPID PANEL
Cholesterol: 162 mg/dL (ref 0–200)
HDL: 35.4 mg/dL — ABNORMAL LOW (ref >39.00)
LDL Cholesterol: 109 mg/dL — ABNORMAL HIGH (ref 0–99)
NonHDL: 126.6
Total CHOL/HDL Ratio: 5
Triglycerides: 88 mg/dL (ref 0.0–149.0)
VLDL: 17.6 mg/dL (ref 0.0–40.0)

## 2022-02-10 LAB — BASIC METABOLIC PANEL
BUN: 5 mg/dL — ABNORMAL LOW (ref 6–23)
CO2: 30 mEq/L (ref 19–32)
Calcium: 9 mg/dL (ref 8.4–10.5)
Chloride: 100 mEq/L (ref 96–112)
Creatinine, Ser: 0.59 mg/dL (ref 0.40–1.20)
GFR: 87.51 mL/min (ref >60.00)
Glucose, Bld: 118 mg/dL — ABNORMAL HIGH (ref 70–99)
Potassium: 3.6 mEq/L (ref 3.5–5.1)
Sodium: 139 mEq/L (ref 135–145)

## 2022-02-10 LAB — VITAMIN B12: Vitamin B-12: >1500 pg/mL — ABNORMAL HIGH (ref 211–911)

## 2022-02-10 LAB — HEPATIC FUNCTION PANEL
ALT: 7 U/L (ref 0–35)
AST: 15 U/L (ref 0–37)
Albumin: 3.9 g/dL (ref 3.5–5.2)
Alkaline Phosphatase: 102 U/L (ref 39–117)
Bilirubin, Direct: 0.1 mg/dL (ref 0.0–0.3)
Total Bilirubin: 0.6 mg/dL (ref 0.2–1.2)
Total Protein: 8.2 g/dL (ref 6.0–8.3)

## 2022-02-10 LAB — HEMOGLOBIN A1C: Hgb A1c MFr Bld: 5.6 % (ref 4.6–6.5)

## 2022-02-10 LAB — TSH: TSH: 0.97 u[IU]/mL (ref 0.35–5.50)

## 2022-02-10 LAB — VITAMIN D 25 HYDROXY (VIT D DEFICIENCY, FRACTURES): VITD: 10.59 ng/mL — ABNORMAL LOW (ref 30.00–100.00)

## 2022-02-10 LAB — FERRITIN: Ferritin: 90.2 ng/mL (ref 10.0–291.0)

## 2022-02-10 LAB — MICROALBUMIN / CREATININE URINE RATIO
Creatinine,U: 32.6 mg/dL
Microalb Creat Ratio: 2.1 mg/g (ref 0.0–30.0)
Microalb, Ur: <0.7 mg/dL (ref 0.0–1.9)

## 2022-02-10 LAB — IBC PANEL
Iron: 46 ug/dL (ref 42–145)
Saturation Ratios: 15.6 % — ABNORMAL LOW (ref 20.0–50.0)
TIBC: 295.4 ug/dL (ref 250.0–450.0)
Transferrin: 211 mg/dL — ABNORMAL LOW (ref 212.0–360.0)

## 2022-02-10 MED ORDER — AZITHROMYCIN 250 MG PO TABS: ORAL_TABLET | ORAL | 1 refills | Status: AC

## 2022-02-10 MED ORDER — OXYCODONE HCL ER 10 MG PO T12A: 10.0000 mg | EXTENDED_RELEASE_TABLET | Freq: Two times a day (BID) | ORAL | 0 refills | Status: DC

## 2022-02-10 MED ORDER — CYANOCOBALAMIN 1000 MCG/ML IJ SOLN
1000.0000 ug | Freq: Once | INTRAMUSCULAR | Status: AC
  Administered 2022-02-10: 1000 ug via INTRAMUSCULAR

## 2022-02-10 MED ORDER — EZETIMIBE 10 MG PO TABS: 10.0000 mg | ORAL_TABLET | Freq: Every day | ORAL | 3 refills | Status: DC

## 2022-02-10 NOTE — Patient Instructions (Signed)
You had the B12 shot today  Please continue all other medications as before, and refills have been done if requested - 12 hr oxycodone  Please take all new medication as prescribed - the antibiotic  Please have the pharmacy call with any other refills you may need.  Please continue your efforts at being more active, low cholesterol diet, and weight control.  You are otherwise up to date with prevention measures today.  Please keep your appointments with your specialists as you may have planned  Please go to the LAB at the blood drawing area for the tests to be done  You will be contacted by phone if any changes need to be made immediately.  Otherwise, you will receive a letter about your results with an explanation, but please check with MyChart first.  Please remember to sign up for MyChart if you have not done so, as this will be important to you in the future with finding out test results, communicating by private email, and scheduling acute appointments online when needed.  Please make an Appointment to return in 4 months, or sooner if needed

## 2022-02-10 NOTE — Progress Notes (Signed)
Patient ID: [name], female   DOB: 12/21/1945, 77 y.o.   MRN: [id number]         Chief Complaint:: wellness exam and rght otitis media, b12 deficiency, chronic lbp, fatigue, pedal edema       HPI:  [name] is a 77 y.o. female here for wellness exam; declines tdap, covid booster, o/w up to date                        Also due for B12 shot today  c/o ritht ear pain with reduced hearing and feverish for 3 days.  Pt denies chest pain, increased sob or doe, wheezing, orthopnea, PND, increased LE swelling, palpitations, dizziness or syncope, though does have very mild chronic pedal edema.  Pt continues to have recurring LBP without change in severity, bowel or bladder change, fever, wt loss,  worsening LE pain/numbness/weakness, gait change or falls.   Takes oxycontin q 12 prn very limited for pain recenlty.  Asks for refill, plans to f/u ortho soon.  Also with recent wt los, early satiety, fatigue, and cold intolerance. Needs new dentures. Declines GI referral   Wt Readings from Last 3 Encounters:  02/10/22 179 lb (81.2 kg)  01/26/22 174 lb (78.9 kg)  12/23/21 188 lb (85.3 kg)   BP Readings from Last 3 Encounters:  02/10/22 118/64  01/26/22 113/69  12/23/21 116/62   Immunization History  Administered Date(s) Administered   Fluad Quad(high Dose 65+) 11/15/2019, 09/17/2020, 10/11/2021   Influenza Split 11/02/2010, 10/20/2011   Influenza Whole 11/23/2005, 10/15/2007, 10/31/2008, 11/11/2009   Influenza, High Dose Seasonal PF 09/23/2016, 10/19/2017   Influenza,inj,Quad PF,6+ Mos 10/18/2012, 09/05/2013, 10/30/2015   Influenza-Unspecified 10/18/2014, 09/23/2016, 09/11/2018   PFIZER(Purple Top)SARS-COV-2 Vaccination 03/02/2019, 03/27/2019, 11/05/2019, 10/19/2020   Pneumococcal Conjugate-13 10/18/2012, 02/21/2013   Pneumococcal Polysaccharide-23 11/17/2005, 11/09/2010   Tetanus 02/21/2013   Zoster Recombinat (Shingrix) 09/12/2018, 11/16/2018   Zoster, Live 11/23/2005   Health  Maintenance Due  Topic Date Due   DTaP/Tdap/Td (1 - Tdap) 02/22/2013      Past Medical History:  Diagnosis Date   AAA (abdominal aortic aneurysm) ([place])    a. s/p stent graft repair 01/2016.   Acute ischemic colitis ([place]) 07/29/2010   Diagnosed June, 2012 characterized by acute lower GI bleeding, spontaneously resolved.    Acute respiratory infection 06/09/2014   Acute sinus infection 05/14/2015   Allergic rhinitis, cause unspecified    Angioedema of lips 06/03/2014   Anxiety state, unspecified    Arthritis of left hip 04/16/2015   Bilateral hearing loss 07/19/2017   Chronic LBP    Coronary artery disease    a. inferior MI 1998 s/p PCI of RCA. b. stenting of Cx 04/2010. c. DES to prox LAD 05/2013. d. DES x 3 in 10/2015. // Myoview 07/2018:  EF 59, normal perfusion, low risk    Coronary artery disease involving native heart without angina pectoris 07/31/2006   Qualifier: Diagnosis of  By: [name] MD, [name]  ANGIOGRAPHIC DATA:  1. Ventriculography done in the RAO projection reveals a small wall  motion abnormality in the mid inferior wall. Ejection fraction  would be estimated at around 50%.  2. The right coronary artery has some progressive disease of about 60-  80% in the proximal mid segment. The distal vessel appears to be  50-70% narrowing althou   Degeneration of lumbar or lumbosacral intervertebral disc    Depressive disorder, not elsewhere classified    Diabetes ([place])  11/03/2006   Qualifier: Diagnosis of  By: [name] MD, [name]    Diabetes mellitus    TYPE II   Frequent urination 05/08/2014   Gout 08/22/2013   History of endovascular stent graft for abdominal aortic aneurysm (AAA) 01/22/2016   Hyperlipidemia    Hypertension    Hypothyroidism    Iron deficiency anemia 06/13/2017   Lower GI bleed 06/2010   Diverticular bleed   Lumbar radiculopathy 05/15/2015   MENOPAUSAL DISORDER 10/29/2007   Qualifier: Diagnosis of  By: [name] MD, [name]    Myalgia 09/25/2013   Noncompliance with medications  02/26/2014   Obesity, unspecified    OTITIS MEDIA, SEROUS, CHRONIC 04/23/2007   Qualifier: Diagnosis of  By: [name] MD, [name]    Thoracic disc disease 11/19/2018   URTICARIA 09/23/2009   Qualifier: Diagnosis of  By: [name] MD, Mateo Flow A    Venous insufficiency 07/19/2017   Past Surgical History:  Procedure Laterality Date   CARDIAC CATHETERIZATION     PCI OF BOTH THE CIRCUMFLEX AND LEFT ANTERIOR DESCENDING ARTERY   CARDIAC CATHETERIZATION N/A 10/29/2015   Procedure: Coronary Stent Intervention;  Surgeon: Nelva Bush, MD;  Location: [facility];  Service: Cardiovascular;  Laterality: N/A;   CARDIAC CATHETERIZATION N/A 10/29/2015   Procedure: Coronary/Graft Angiography;  Surgeon: Nelva Bush, MD;  Location: [facility];  Service: Cardiovascular;  Laterality: N/A;   CARDIAC CATHETERIZATION N/A 10/29/2015   Procedure: Intravascular Pressure Wire/FFR Study;  Surgeon: Nelva Bush, MD;  Location: [facility];  Service: Cardiovascular;  Laterality: N/A;   CESAREAN SECTION     ENDOVASCULAR STENT INSERTION N/A 01/22/2016   Procedure: ABDOMINAL AORTIC ENDOVASCULAR STENT GRAFT INSERTION;  Surgeon: Rosetta Posner, MD;  Location: [facility];  Service: Vascular;  Laterality: N/A;   HEART STENT  04-2010  and  Jun 07, 2013   X 3   LEFT HEART CATH AND CORONARY ANGIOGRAPHY N/A 04/03/2019   Procedure: LEFT HEART CATH AND CORONARY ANGIOGRAPHY;  Surgeon: Nelva Bush, MD;  Location: [facility];  Service: Cardiovascular;  Laterality: N/A;   LEFT HEART CATHETERIZATION WITH CORONARY ANGIOGRAM N/A 06/07/2013   Procedure: LEFT HEART CATHETERIZATION WITH CORONARY ANGIOGRAM;  Surgeon: [name], MD;  Location: [facility];  Service: Cardiovascular;  Laterality: N/A;   LEFT HEART CATHETERIZATION WITH CORONARY ANGIOGRAM N/A 02/25/2014   Procedure: LEFT HEART CATHETERIZATION WITH CORONARY ANGIOGRAM;  Surgeon: Troy Sine, MD;  Location: [facility] C[facility] LAB;  Service: Cardiovascular;   Laterality: N/A;   LUMBAR FUSION  01/2007   DR. Patrice Paradise...3-LEVEL WITH FIXATION   OOPHORECTOMY     BSO? pt.unsure   PARATHYROIDECTOMY     SPINE SURGERY     THYROIDECTOMY     TOTAL ABDOMINAL HYSTERECTOMY      reports that she quit smoking about 35 years ago. Her smoking use included cigarettes. She has never used smokeless tobacco. She reports that she does not drink alcohol and does not use drugs. family history includes Arthritis in her maternal aunt; Breast cancer in her maternal aunt; Diabetes in her brother and maternal aunt; Heart attack (age of onset: 70) in her mother; Heart attack (age of onset: 35) in her father; Heart disease in her father and mother. Allergies  Allergen Reactions   Crestor [Rosuvastatin Calcium] Other (See Comments)    Feeling poor   Prilosec [Omeprazole] Other (See Comments)    Chest pain   Miconazole Nitrate Hives    REACTION: hives  Augmentin [Amoxicillin-Pot Clavulanate] Hives, Itching and Rash    Has patient had a PCN reaction causing immediate rash, facial/tongue/throat swelling, SOB or lightheadedness with hypotension:unsure Has patient had a PCN reaction causing severe rash involving mucus membranes or skin necrosis:unsure Has patient had a PCN reaction that required hospitalization:No Has patient had a PCN reaction occurring within the last 10 years:NO If all of the above answers are "NO", then may proceed with Cephalosporin use. Has patient had a PCN reaction causing immediate rash, facial/tongue/throat swelling, SOB or lightheadedness with hypotension:unsure Has patient had a PCN reaction causing severe rash involving mucus membranes or skin necrosis:unsure Has patient had a PCN reaction that required hospitalization:No Has patient had a PCN reaction occurring within the last 10 years:NO If all of the above answers are "NO", then may proceed with Cephalosporin use.    Doxycycline Other (See Comments)    REACTION: gi upset   Current Outpatient  Medications on File Prior to Visit  Medication Sig Dispense Refill   allopurinol (ZYLOPRIM) 300 MG tablet Take 1 tablet (300 mg total) by mouth as needed (for Gout). 90 tablet 3   aspirin 81 MG EC tablet Take 81 mg by mouth daily.      atenolol (TENORMIN) 25 MG tablet Take 1 tablet (25 mg total) by mouth daily. 90 tablet 3   atorvastatin (LIPITOR) 80 MG tablet Take 1 tablet (80 mg total) by mouth daily.  1   celecoxib (CELEBREX) 200 MG capsule Take 1 capsule (200 mg total) by mouth 2 (two) times daily as needed for moderate pain. 180 capsule 1   Cholecalciferol (THERA-D 2000) 50 MCG (2000 UT) TABS 1 tab by mouth once daily 90 tablet 99   fluconazole (DIFLUCAN) 150 MG tablet 1 tab by mouth every 3 days as needed 2 tablet 1   furosemide (LASIX) 40 MG tablet Take 1 tablet (40 mg total) by mouth daily. (Patient taking differently: Take 40 mg by mouth daily as needed for fluid.) 90 tablet 3   gabapentin (NEURONTIN) 300 MG capsule Take 1-2 tab by mouth at bedtime for pain and sleep 180 capsule 1   nitroGLYCERIN (NITROSTAT) 0.4 MG SL tablet Place 1 tablet (0.4 mg total) under the tongue every 5 (five) minutes as needed for chest pain. 25 tablet 3   pantoprazole (PROTONIX) 40 MG tablet Take 40 mg by mouth daily as needed (heartburn acid reflux).     potassium chloride (KLOR-CON) 10 MEQ tablet Take 1 tablet (10 mEq total) by mouth daily. 90 tablet 3   pregabalin (LYRICA) 75 MG capsule Take 75 mg by mouth daily as needed (pain).      SYNTHROID 150 MCG tablet 1 tab by mouth once daily 90 tablet 3   tiZANidine (ZANAFLEX) 2 MG tablet Take 1 tablet (2 mg total) by mouth every 6 (six) hours as needed for muscle spasms. 40 tablet 1   triamcinolone (NASACORT) 55 MCG/ACT AERO nasal inhaler Place 2 sprays into the nose as needed (congestion). 1 each 12   isosorbide mononitrate (IMDUR) 30 MG 24 hr tablet Take 0.5 tablets (15 mg total) by mouth daily. 45 tablet 3   No current facility-administered medications on file  prior to visit.        ROS:  All others reviewed and negative.  Objective        PE:  BP 118/64 (BP Location: Left Arm, Patient Position: Sitting, Cuff Size: Large)   Pulse 60   Temp 97.9 F (36.6 C) (Oral)  Ht '5\' 6"'$  (1.676 m)   Wt 179 lb (81.2 kg)   SpO2 97%   BMI 28.89 kg/m                 Constitutional: Pt appears in NAD               HENT: Head: NCAT.                Right Ear: External ear normal.  Right TM severe erythema, bulging               Left Ear: External ear normal.                Eyes: . Pupils are equal, round, and reactive to light. Conjunctivae and EOM are normal               Nose: without d/c or deformity               Neck: Neck supple. Gross normal ROM               Cardiovascular: Normal rate and regular rhythm.                 Pulmonary/Chest: Effort normal and breath sounds without rales or wheezing.                Abd:  Soft, NT, ND, + BS, no organomegaly               Neurological: Pt is alert. At baseline orientation, motor grossly intact               Skin: Skin is warm. No rashes, no other new lesions, LE edema - trace bipedal               Psychiatric: Pt behavior is normal without agitation   Micro: none  Cardiac tracings I have personally interpreted today:  none  Pertinent Radiological findings (summarize): none   Lab Results  Component Value Date   WBC 7.1 02/10/2022   HGB 13.8 02/10/2022   HCT 42.6 02/10/2022   PLT 274.0 02/10/2022   GLUCOSE 118 (H) 02/10/2022   CHOL 162 02/10/2022   TRIG 88.0 02/10/2022   HDL 35.40 (L) 02/10/2022   LDLDIRECT 131.0 03/27/2015   LDLCALC 109 (H) 02/10/2022   ALT 7 02/10/2022   AST 15 02/10/2022   NA 139 02/10/2022   K 3.6 02/10/2022   CL 100 02/10/2022   CREATININE 0.59 02/10/2022   BUN 5 (L) 02/10/2022   CO2 30 02/10/2022   TSH 0.97 02/10/2022   INR 1.08 01/22/2016   HGBA1C 5.6 02/10/2022   MICROALBUR <0.7 02/10/2022   Assessment/Plan:  [name] is a 77 y.o. Black or African  American [2] female with  has a past medical history of AAA (abdominal aortic aneurysm) ([place]), Acute ischemic colitis ([place]) (07/29/2010), Acute respiratory infection (06/09/2014), Acute sinus infection (05/14/2015), Allergic rhinitis, cause unspecified, Angioedema of lips (06/03/2014), Anxiety state, unspecified, Arthritis of left hip (04/16/2015), Bilateral hearing loss (07/19/2017), Chronic LBP, Coronary artery disease, Coronary artery disease involving native heart without angina pectoris (07/31/2006), Degeneration of lumbar or lumbosacral intervertebral disc, Depressive disorder, not elsewhere classified, Diabetes ([place]) (11/03/2006), Diabetes mellitus, Frequent urination (05/08/2014), Gout (08/22/2013), History of endovascular stent graft for abdominal aortic aneurysm (AAA) (01/22/2016), Hyperlipidemia, Hypertension, Hypothyroidism, Iron deficiency anemia (06/13/2017), Lower GI bleed (06/2010), Lumbar radiculopathy (05/15/2015), MENOPAUSAL DISORDER (10/29/2007), Myalgia (09/25/2013), Noncompliance with medications (02/26/2014), Obesity, unspecified, OTITIS MEDIA, SEROUS, CHRONIC (  04/23/2007), Thoracic disc disease (11/19/2018), URTICARIA (09/23/2009), and Venous insufficiency (07/19/2017).  Encounter for well adult exam with abnormal findings Age and sex appropriate education and counseling updated with regular exercise and diet Referrals for preventative services - none needed Immunizations addressed - declines covid bosoter, tdap Smoking counseling  - none needed Evidence for depression or other mood disorder - anxiety depression stable Most recent labs reviewed. I have personally reviewed and have noted: 1) the patient's medical and social history 2) The patient's current medications and supplements 3) The patient's height, weight, and BMI have been recorded in the chart   Chronic low back pain For limited refill oxycontin 10 bid prn, pt to f/u surgury as planned  B12 deficiency For B12 1000 mcg IM today  Diabetes  ([place]) Lab Results  Component Value Date   HGBA1C 5.6 02/10/2022   Stable, pt to continue current medical treatment  - diet, wt control   Essential hypertension BP Readings from Last 3 Encounters:  02/10/22 118/64  01/26/22 113/69  12/23/21 116/62   Stable, pt to continue medical treatment tenormin 25 mg qd   Hypothyroidism Lab Results  Component Value Date   TSH 0.97 02/10/2022   Stable, pt to continue levothyroxine 150 mcg qd   Iron deficiency anemia No recent overt bleeding, for f/U iron with labs  Vitamin D deficiency Last vitamin D Lab Results  Component Value Date   VD25OH 10.59 (L) 02/10/2022  Low, to start oral replacement   Right otitis media Mild to mod, for zpack antibx course,  to f/u any worsening symptoms or concerns  Followup: Return in about 4 months (around 06/11/2022).  [name], MD 02/13/2022 7:06 PM [facility]

## 2022-02-13 ENCOUNTER — Encounter: Payer: Self-pay | Admitting: Internal Medicine

## 2022-02-13 NOTE — Assessment & Plan Note (Signed)
Age and sex appropriate education and counseling updated with regular exercise and diet Referrals for preventative services - none needed Immunizations addressed - declines covid bosoter, tdap Smoking counseling  - none needed Evidence for depression or other mood disorder - anxiety depression stable Most recent labs reviewed. I have personally reviewed and have noted: 1) the patient's medical and social history 2) The patient's current medications and supplements 3) The patient's height, weight, and BMI have been recorded in the chart

## 2022-02-13 NOTE — Assessment & Plan Note (Signed)
Lab Results  Component Value Date   TSH 0.97 02/10/2022   Stable, pt to continue levothyroxine 150 mcg qd

## 2022-02-13 NOTE — Assessment & Plan Note (Signed)
BP Readings from Last 3 Encounters:  02/10/22 118/64  01/26/22 113/69  12/23/21 116/62   Stable, pt to continue medical treatment tenormin 25 mg qd

## 2022-02-13 NOTE — Assessment & Plan Note (Signed)
Last vitamin D Lab Results  Component Value Date   VD25OH 10.59 (L) 02/10/2022  Low, to start oral replacement

## 2022-02-13 NOTE — Assessment & Plan Note (Signed)
For limited refill oxycontin 10 bid prn, pt to f/u surgury as planned

## 2022-02-13 NOTE — Assessment & Plan Note (Signed)
Mild to mod, for zpack antibx course,  to f/u any worsening symptoms or concerns

## 2022-02-13 NOTE — Assessment & Plan Note (Signed)
No recent overt bleeding, for f/U iron with labs

## 2022-02-13 NOTE — Assessment & Plan Note (Signed)
For B12 1000 mcg IM today

## 2022-02-13 NOTE — Assessment & Plan Note (Signed)
Lab Results  Component Value Date   HGBA1C 5.6 02/10/2022   Stable, pt to continue current medical treatment  - diet, wt control

## 2022-02-14 ENCOUNTER — Telehealth: Payer: Self-pay | Admitting: Internal Medicine

## 2022-02-14 NOTE — Telephone Encounter (Signed)
Called pt she states pharmacy I waiting on call back concerning her oxycodone script. Inform pt will call [facility] to see wht is going on and will give her a call back. Called pharmacy she states that the Oxycontin need a PA. Submitted PA w/ (Key: [id number]) received msg Available without authorization. Notified pharmacy spke w/ [facility] [place] gave insurance response./Notified pt to contact [facility] concerning refill.Marland KitchenJohny [name]

## 2022-02-14 NOTE — Telephone Encounter (Signed)
Patient called and requested a callback did not want to disclose what the call was about. Best callback number is [phone].

## 2022-02-18 ENCOUNTER — Telehealth: Payer: Self-pay | Admitting: Internal Medicine

## 2022-02-18 NOTE — Telephone Encounter (Signed)
Patient called regarding PA for oxycodone  PA started  Key: [facility]

## 2022-02-18 NOTE — Telephone Encounter (Signed)
Patient called requesting a call back from you. Best callback number is [phone].

## 2022-03-01 NOTE — Telephone Encounter (Signed)
Pharmacy Patient Advocate Encounter  Received notification from [facility] that the request for prior authorization for Oxycodone ER has been denied due to .    Please be advised we currently do not have a Pharmacist to review denials, therefore you will need to process appeals accordingly as needed. Thanks for your support at this time.   You may call [phone] or fax [phone], to appeal.

## 2022-03-28 ENCOUNTER — Telehealth: Payer: Self-pay

## 2022-03-28 NOTE — Telephone Encounter (Signed)
Contacted [name] to schedule their annual wellness visit. Appointment made for 04/05/22.  Norton Blizzard, [place] ([facility])  [place] [facility] [phone]

## 2022-04-05 ENCOUNTER — Ambulatory Visit (INDEPENDENT_AMBULATORY_CARE_PROVIDER_SITE_OTHER): Payer: Medicare PPO

## 2022-04-05 VITALS — Ht 66.0 in | Wt 179.0 lb

## 2022-04-05 DIAGNOSIS — Z0000 Encounter for general adult medical examination without abnormal findings: Secondary | ICD-10-CM

## 2022-04-05 NOTE — Progress Notes (Signed)
Subjective:   [name] is a 77 y.o. female who presents for an Initial Medicare Annual Wellness Visit.  I connected with  [name] on 04/05/22 by a audio enabled telemedicine application and verified that I am speaking with the correct person using two identifiers.  Patient Location: Home  Provider Location: Home Office  I discussed the limitations of evaluation and management by telemedicine. The patient expressed understanding and agreed to proceed.  Review of Systems     Cardiac Risk Factors include: advanced age (>108men, >7 women);diabetes mellitus;dyslipidemia;hypertension;sedentary lifestyle     Objective:    Today's Vitals   04/05/22 1353  Weight: 179 lb (81.2 kg)  Height: 5\' 6"  (1.676 m)   Body mass index is 28.89 kg/m.     04/05/2022    2:05 PM 10/29/2020    9:38 PM 10/27/2020    9:22 PM 10/27/2020    7:51 PM 10/27/2020    4:37 PM 10/24/2020    6:14 PM 04/03/2019    8:55 AM  Advanced Directives  Does Patient Have a Medical Advance Directive? No No No No No No No  Would patient like information on creating a medical advance directive? No - Patient declined  No - Patient declined No - Patient declined  No - Patient declined No - Patient declined    Current Medications (verified) Outpatient Encounter Medications as of 04/05/2022  Medication Sig   allopurinol (ZYLOPRIM) 300 MG tablet Take 1 tablet (300 mg total) by mouth as needed (for Gout).   aspirin 81 MG EC tablet Take 81 mg by mouth daily.    atenolol (TENORMIN) 25 MG tablet Take 1 tablet (25 mg total) by mouth daily.   atorvastatin (LIPITOR) 80 MG tablet Take 1 tablet (80 mg total) by mouth daily.   celecoxib (CELEBREX) 200 MG capsule Take 1 capsule (200 mg total) by mouth 2 (two) times daily as needed for moderate pain.   Cholecalciferol (THERA-D 2000) 50 MCG (2000 UT) TABS 1 tab by mouth once daily   ezetimibe (ZETIA) 10 MG tablet Take 1 tablet (10 mg total) by mouth daily.   fluconazole  (DIFLUCAN) 150 MG tablet 1 tab by mouth every 3 days as needed   furosemide (LASIX) 40 MG tablet Take 1 tablet (40 mg total) by mouth daily. (Patient taking differently: Take 40 mg by mouth daily as needed for fluid.)   gabapentin (NEURONTIN) 300 MG capsule Take 1-2 tab by mouth at bedtime for pain and sleep   nitroGLYCERIN (NITROSTAT) 0.4 MG SL tablet Place 1 tablet (0.4 mg total) under the tongue every 5 (five) minutes as needed for chest pain.   oxyCODONE (OXYCONTIN) 10 mg 12 hr tablet Take 1 tablet (10 mg total) by mouth every 12 (twelve) hours.   pantoprazole (PROTONIX) 40 MG tablet Take 40 mg by mouth daily as needed (heartburn acid reflux).   potassium chloride (KLOR-CON) 10 MEQ tablet Take 1 tablet (10 mEq total) by mouth daily.   pregabalin (LYRICA) 75 MG capsule Take 75 mg by mouth daily as needed (pain).    SYNTHROID 150 MCG tablet 1 tab by mouth once daily   tiZANidine (ZANAFLEX) 2 MG tablet Take 1 tablet (2 mg total) by mouth every 6 (six) hours as needed for muscle spasms.   triamcinolone (NASACORT) 55 MCG/ACT AERO nasal inhaler Place 2 sprays into the nose as needed (congestion).   isosorbide mononitrate (IMDUR) 30 MG 24 hr tablet Take 0.5 tablets (15 mg total) by mouth daily.  No facility-administered encounter medications on file as of 04/05/2022.    Allergies (verified) Crestor [rosuvastatin calcium], Prilosec [omeprazole], Miconazole nitrate, Augmentin [amoxicillin-pot clavulanate], and Doxycycline   History: Past Medical History:  Diagnosis Date   AAA (abdominal aortic aneurysm) ([place])    a. s/p stent graft repair 01/2016.   Acute ischemic colitis ([place]) 07/29/2010   Diagnosed June, 2012 characterized by acute lower GI bleeding, spontaneously resolved.    Acute respiratory infection 06/09/2014   Acute sinus infection 05/14/2015   Allergic rhinitis, cause unspecified    Angioedema of lips 06/03/2014   Anxiety state, unspecified    Arthritis of left hip 04/16/2015    Bilateral hearing loss 07/19/2017   Chronic LBP    Coronary artery disease    a. inferior MI 1998 s/p PCI of RCA. b. stenting of Cx 04/2010. c. DES to prox LAD 05/2013. d. DES x 3 in 10/2015. // Myoview 07/2018:  EF 59, normal perfusion, low risk    Coronary artery disease involving native heart without angina pectoris 07/31/2006   Qualifier: Diagnosis of  By: [name] MD, [name]  ANGIOGRAPHIC DATA:  1. Ventriculography done in the RAO projection reveals a small wall  motion abnormality in the mid inferior wall. Ejection fraction  would be estimated at around 50%.  2. The right coronary artery has some progressive disease of about 60-  80% in the proximal mid segment. The distal vessel appears to be  50-70% narrowing althou   Degeneration of lumbar or lumbosacral intervertebral disc    Depressive disorder, not elsewhere classified    Diabetes ([place]) 11/03/2006   Qualifier: Diagnosis of  By: [name] MD, [name]    Diabetes mellitus    TYPE II   Frequent urination 05/08/2014   Gout 08/22/2013   History of endovascular stent graft for abdominal aortic aneurysm (AAA) 01/22/2016   Hyperlipidemia    Hypertension    Hypothyroidism    Iron deficiency anemia 06/13/2017   Lower GI bleed 06/2010   Diverticular bleed   Lumbar radiculopathy 05/15/2015   MENOPAUSAL DISORDER 10/29/2007   Qualifier: Diagnosis of  By: [name] MD, [name]    Myalgia 09/25/2013   Noncompliance with medications 02/26/2014   Obesity, unspecified    OTITIS MEDIA, SEROUS, CHRONIC 04/23/2007   Qualifier: Diagnosis of  By: [name] MD, [name]    Thoracic disc disease 11/19/2018   URTICARIA 09/23/2009   Qualifier: Diagnosis of  By: [name] MD, Mateo Flow A    Venous insufficiency 07/19/2017   Past Surgical History:  Procedure Laterality Date   CARDIAC CATHETERIZATION     PCI OF BOTH THE CIRCUMFLEX AND LEFT ANTERIOR DESCENDING ARTERY   CARDIAC CATHETERIZATION N/A 10/29/2015   Procedure: Coronary Stent Intervention;  Surgeon: Nelva Bush, MD;   Location: [facility];  Service: Cardiovascular;  Laterality: N/A;   CARDIAC CATHETERIZATION N/A 10/29/2015   Procedure: Coronary/Graft Angiography;  Surgeon: Nelva Bush, MD;  Location: [facility];  Service: Cardiovascular;  Laterality: N/A;   CARDIAC CATHETERIZATION N/A 10/29/2015   Procedure: Intravascular Pressure Wire/FFR Study;  Surgeon: Nelva Bush, MD;  Location: [facility];  Service: Cardiovascular;  Laterality: N/A;   CESAREAN SECTION     ENDOVASCULAR STENT INSERTION N/A 01/22/2016   Procedure: ABDOMINAL AORTIC ENDOVASCULAR STENT GRAFT INSERTION;  Surgeon: Rosetta Posner, MD;  Location: [facility];  Service: Vascular;  Laterality: N/A;   HEART STENT  04-2010  and  Jun 07, 2013   X 3   LEFT  HEART CATH AND CORONARY ANGIOGRAPHY N/A 04/03/2019   Procedure: LEFT HEART CATH AND CORONARY ANGIOGRAPHY;  Surgeon: Nelva Bush, MD;  Location: [facility];  Service: Cardiovascular;  Laterality: N/A;   LEFT HEART CATHETERIZATION WITH CORONARY ANGIOGRAM N/A 06/07/2013   Procedure: LEFT HEART CATHETERIZATION WITH CORONARY ANGIOGRAM;  Surgeon: [name], MD;  [facility];  Service: Cardiovascular;  Laterality: N/A;   LEFT HEART CATHETERIZATION WITH CORONARY ANGIOGRAM N/A 02/25/2014   Procedure: LEFT HEART CATHETERIZATION WITH CORONARY ANGIOGRAM;  Surgeon: Troy Sine, MD;  Location: [facility] CATH LAB;  Service: Cardiovascular;  Laterality: N/A;   LUMBAR FUSION  01/2007   DR. Patrice Paradise...3-LEVEL WITH FIXATION   OOPHORECTOMY     BSO? pt.unsure   PARATHYROIDECTOMY     SPINE SURGERY     THYROIDECTOMY     TOTAL ABDOMINAL HYSTERECTOMY     Family History  Problem Relation Age of Onset   Heart attack Mother 20       s/p D&C-CARDIAC ARREST 1966   Heart disease Mother    Heart attack Father 68       53 WITH MI   Heart disease Father    Diabetes Brother    Diabetes Maternal Aunt    Arthritis Maternal Aunt    Breast cancer Maternal Aunt        Post  menopausal   Colon cancer Neg Hx    Social History   Socioeconomic History   Marital status: Divorced    Spouse name: Not on file   Number of children: 3   Years of education: 13   Highest education level: Not on file  Occupational History   Occupation: RETIRED    Fish farm manager: [employer]    Comment: [employer]    Employer: RETIRED  Tobacco Use   Smoking status: Former    Years: 55    Types: Cigarettes    Quit date: 05/31/1986    Years since quitting: 35.8   Smokeless tobacco: Never  Vaping Use   Vaping Use: Never used  Substance and Sexual Activity   Alcohol use: No   Drug use: No   Sexual activity: Not Currently    Comment: 1st intercourse 77 yo-Fewer than 5 partners  Other Topics Concern   Not on file  Social History Narrative   DIVORCED   3 CHILDREN   PATIENT SIGNED A DESIGNATED PARTY RELEASE TO ALLOW HER DAUGHTER, TRAMAINE Picking, TO HAVE ACCESS TO HER MEDICAL RECORDS/INFORMATION. [name], May 04, 2009 @ 3:27 PM   Right handed   One story home   .   Social Determinants of Health   Financial Resource Strain: Low Risk  (04/05/2022)   Overall Financial Resource Strain (CARDIA)    Difficulty of Paying Living Expenses: Not hard at all  Food Insecurity: No Food Insecurity (04/05/2022)   Hunger Vital Sign    Worried About Running Out of Food in the Last Year: Never true    Ran Out of Food in the Last Year: Never true  Transportation Needs: No Transportation Needs (04/05/2022)   PRAPARE - Hydrologist (Medical): No    Lack of Transportation (Non-Medical): No  Physical Activity: Inactive (04/05/2022)   Exercise Vital Sign    Days of Exercise per Week: 0 days    Minutes of Exercise per Session: 0 min  Stress: No Stress Concern Present (04/05/2022)   [place]  Feeling of Stress : Not at all  Social Connections: Moderately Isolated (04/05/2022)   Social  Connection and Isolation Panel [NHANES]    Frequency of Communication with Friends and Family: More than three times a week    Frequency of Social Gatherings with Friends and Family: More than three times a week    Attends Religious Services: More than 4 times per year    Active Member of [employer] or Organizations: No    Attends Music therapist: Never    Marital Status: Divorced    Tobacco Counseling Counseling given: Not Answered   Clinical Intake:  Pre-visit preparation completed: Yes  Pain : No/denies pain  Diabetes: Yes CBG done?: No Did pt. bring in CBG monitor from home?: No  How often do you need to have someone help you when you read instructions, pamphlets, or other written materials from your doctor or pharmacy?: 1 - Never  Diabetic?Yes   Nutrition Risk Assessment:  Has the patient had any N/V/D within the last 2 months?  No  Does the patient have any non-healing wounds?  No  Has the patient had any unintentional weight loss or weight gain?  No   Diabetes:  Is the patient diabetic?  Yes  If diabetic, was a CBG obtained today?  No  Did the patient bring in their glucometer from home?  No  How often do you monitor your CBG's? As needed.   Financial Strains and Diabetes Management:  Are you having any financial strains with the device, your supplies or your medication? No .  Does the patient want to be seen by Chronic Care Management for management of their diabetes?  No  Would the patient like to be referred to a Nutritionist or for Diabetic Management?  No   Diabetic Exams:  Diabetic Eye Exam: Completed 08/23/21 Diabetic Foot Exam: Completed 02/10/22   Interpreter Needed?: No  Information entered by :: Denman George LPN   Activities of Daily Living    04/05/2022    2:05 PM  In your present state of health, do you have any difficulty performing the following activities:  Hearing? 0  Vision? 0  Difficulty concentrating or making  decisions? 0  Walking or climbing stairs? 1  Dressing or bathing? 0  Doing errands, shopping? 0  Preparing Food and eating ? N  Using the Toilet? N  In the past six months, have you accidently leaked urine? N  Do you have problems with loss of bowel control? N  Managing your Medications? N  Managing your Finances? N  Housekeeping or managing your Housekeeping? N    Patient Care Team: Biagio Borg, MD as PCP - Cyndia Diver, MD as PCP - Cardiology (Cardiology) Alda Berthold, DO as Consulting Physician (Neurology) Sharmon Revere as Physician Assistant (Cardiology) Pa, [facility] Gregor Hams, MD as Consulting Physician (Family Medicine)  Indicate any recent Medical Services you may have received from other than [facility] providers in the past year (date may be approximate).     Assessment:   This is a routine wellness examination for Grisela.  Hearing/Vision screen Hearing Screening - Comments:: Denies hearing difficulties   Vision Screening - Comments:: Wears rx glasses - up to date with routine eye exams with Dr.Shapiro    Dietary issues and exercise activities discussed: Current Exercise Habits: The patient does not participate in regular exercise at present   Goals Addressed  This Visit's Progress    Prevent falls        Depression Screen    04/05/2022    2:01 PM 02/10/2022    2:47 PM 10/11/2021    2:11 PM 06/09/2021    1:20 PM 02/09/2021    2:50 PM 03/19/2020    4:25 PM 11/15/2019    5:07 PM  PHQ 2/9 Scores  PHQ - 2 Score 0   2 1 0 0  PHQ- 9 Score    10     Exception Documentation  Patient refusal Patient refusal        Fall Risk    04/05/2022    2:00 PM 02/10/2022    3:10 PM 02/10/2022    2:47 PM 10/11/2021    2:11 PM 06/09/2021    1:20 PM  [place] in the past year? 0 0 0 0 1  Number falls in past yr: 0 0 0  1  Injury with Fall? 0 0 0  1  Risk for fall due to : No Fall Risks  No Fall Risks Medication side  effect   Follow up Falls prevention discussed;Education provided;Falls evaluation completed  Falls evaluation completed Falls evaluation completed     FALL RISK PREVENTION PERTAINING TO THE HOME:  Any stairs in or around the home? No  If so, are there any without handrails? No  Home free of loose throw rugs in walkways, pet beds, electrical cords, etc? Yes  Adequate lighting in your home to reduce risk of falls? Yes   ASSISTIVE DEVICES UTILIZED TO PREVENT FALLS:  Life alert? No  Use of a cane, walker or w/c? Yes  Grab bars in the bathroom? Yes  Shower chair or bench in shower? No  Elevated toilet seat or a handicapped toilet? Yes   TIMED UP AND GO:  Was the test performed? No . Telephonic visit   Cognitive Function:        04/05/2022    2:05 PM  6CIT Screen  What Year? 0 points  What month? 0 points  What time? 0 points  Count back from 20 0 points  Months in reverse 0 points  Repeat phrase 0 points  Total Score 0 points    Immunizations Immunization History  Administered Date(s) Administered   Fluad Quad(high Dose 65+) 11/15/2019, 09/17/2020, 10/11/2021   Influenza Split 11/02/2010, 10/20/2011   Influenza Whole 11/23/2005, 10/15/2007, 10/31/2008, 11/11/2009   Influenza, High Dose Seasonal PF 09/23/2016, 10/19/2017   Influenza,inj,Quad PF,6+ Mos 10/18/2012, 09/05/2013, 10/30/2015   Influenza-Unspecified 10/18/2014, 09/23/2016, 09/11/2018   PFIZER(Purple Top)SARS-COV-2 Vaccination 03/02/2019, 03/27/2019, 11/05/2019, 10/19/2020   Pneumococcal Conjugate-13 10/18/2012, 02/21/2013   Pneumococcal Polysaccharide-23 11/17/2005, 11/09/2010   Tetanus 02/21/2013   Zoster Recombinat (Shingrix) 09/12/2018, 11/16/2018   Zoster, Live 11/23/2005    TDAP status: Due, Education has been provided regarding the importance of this vaccine. Advised may receive this vaccine at local pharmacy or Health Dept. Aware to provide a copy of the vaccination record if obtained from local  pharmacy or Health Dept. Verbalized acceptance and understanding.  Flu Vaccine status: Up to date  Pneumococcal vaccine status: Up to date  Covid-19 vaccine status: Information provided on how to obtain vaccines.   Qualifies for Shingles Vaccine? Yes   Zostavax completed No   Shingrix Completed?: Yes  Screening Tests Health Maintenance  Topic Date Due   DTaP/Tdap/Td (1 - Tdap) 02/22/2013   COVID-19 Vaccine (5 - 2023-24 season) 09/17/2021   HEMOGLOBIN A1C  08/11/2022  OPHTHALMOLOGY EXAM  08/24/2022   Diabetic kidney evaluation - eGFR measurement  02/11/2023   Diabetic kidney evaluation - Urine ACR  02/11/2023   FOOT EXAM  02/11/2023   Medicare Annual Wellness (AWV)  04/05/2023   Pneumonia Vaccine 46+ Years old  Completed   INFLUENZA VACCINE  Completed   DEXA SCAN  Completed   Hepatitis C Screening  Completed   Zoster Vaccines- Shingrix  Completed   HPV VACCINES  Aged Out   COLONOSCOPY (Pts 45-49yrs Insurance coverage will need to be confirmed)  [name] Maintenance Due  Topic Date Due   DTaP/Tdap/Td (1 - Tdap) 02/22/2013   COVID-19 Vaccine (5 - 2023-24 season) 09/17/2021    Colorectal cancer screening: No longer required.   Mammogram status: No longer required due to age.  Bone Density status: Completed 128/26/18. Results reflect: Bone density results: OSTEOPENIA. Repeat every 2 years.  Lung Cancer Screening: (Low Dose CT Chest recommended if Age 62-80 years, 30 pack-year currently smoking OR have quit w/in 15years.) does not qualify.   Lung Cancer Screening Referral: n/a  Additional Screening:  Hepatitis C Screening: does qualify; Completed 03/27/15  Vision Screening: Recommended annual ophthalmology exams for early detection of glaucoma and other disorders of the eye. Is the patient up to date with their annual eye exam?  Yes  Who is the provider or what is the name of the office in which the patient attends annual eye exams?  Dr.Shapiro  If pt is not established with a provider, would they like to be referred to a provider to establish care? No .   Dental Screening: Recommended annual dental exams for proper oral hygiene  Community Resource Referral / Chronic Care Management: CRR required this visit?  No   CCM required this visit?  No      Plan:     I have personally reviewed and noted the following in the patient's chart:   Medical and social history Use of alcohol, tobacco or illicit drugs  Current medications and supplements including opioid prescriptions. Patient is not currently taking opioid prescriptions. Functional ability and status Nutritional status Physical activity Advanced directives List of other physicians Hospitalizations, surgeries, and ER visits in previous 12 months Vitals Screenings to include cognitive, depression, and falls Referrals and appointments  In addition, I have reviewed and discussed with patient certain preventive protocols, quality metrics, and best practice recommendations. A written personalized care plan for preventive services as well as general preventive health recommendations were provided to patient.     Vanetta Mulders, [place]   [id number]   Due to this being a virtual visit, the after visit summary with patients personalized plan was offered to patient via mail or my-chart.  per request, patient was mailed a copy of AVS.  Nurse Notes: No concerns

## 2022-04-05 NOTE — Patient Instructions (Signed)
[name] , Thank you for taking time to come for your Medicare Wellness Visit. I appreciate your ongoing commitment to your health goals. Please review the following plan we discussed and let me know if I can assist you in the future.   These are the goals we discussed:  Goals   None     This is a list of the screening recommended for you and due dates:  Health Maintenance  Topic Date Due   DTaP/Tdap/Td vaccine (1 - Tdap) 02/22/2013   COVID-19 Vaccine (5 - 2023-24 season) 09/17/2021   Hemoglobin A1C  08/11/2022   Eye exam for diabetics  08/24/2022   Yearly kidney function blood test for diabetes  02/11/2023   Yearly kidney health urinalysis for diabetes  02/11/2023   Complete foot exam   02/11/2023   Medicare Annual Wellness Visit  04/05/2023   Pneumonia Vaccine  Completed   Flu Shot  Completed   DEXA scan (bone density measurement)  Completed   Hepatitis C Screening: USPSTF Recommendation to screen - Ages 33-79 yo.  Completed   Zoster (Shingles) Vaccine  Completed   HPV Vaccine  Aged Out   Colon Cancer Screening  Discontinued    Advanced directives: Forms are available if you choose in the future to pursue completion.  This is recommended in order to make sure that your health wishes are honored in the event that you are unable to verbalize them to the provider.    Conditions/risks identified: Aim for 30 minutes of exercise or brisk walking, 6-8 glasses of water, and 5 servings of fruits and vegetables each day.   Next appointment: Follow up in one year for your annual wellness visit    Preventive Care 65 Years and Older, Female Preventive care refers to lifestyle choices and visits with your health care provider that can promote health and wellness. What does preventive care include? A yearly physical exam. This is also called an annual well check. Dental exams once or twice a year. Routine eye exams. Ask your health care provider how often you should have your eyes  checked. Personal lifestyle choices, including: Daily care of your teeth and gums. Regular physical activity. Eating a healthy diet. Avoiding tobacco and drug use. Limiting alcohol use. Practicing safe sex. Taking low-dose aspirin every day. Taking vitamin and mineral supplements as recommended by your health care provider. What happens during an annual well check? The services and screenings done by your health care provider during your annual well check will depend on your age, overall health, lifestyle risk factors, and family history of disease. Counseling  Your health care provider may ask you questions about your: Alcohol use. Tobacco use. Drug use. Emotional well-being. Home and relationship well-being. Sexual activity. Eating habits. History of falls. Memory and ability to understand (cognition). Work and work Statistician. Reproductive health. Screening  You may have the following tests or measurements: Height, weight, and BMI. Blood pressure. Lipid and cholesterol levels. These may be checked every 5 years, or more frequently if you are over 32 years old. Skin check. Lung cancer screening. You may have this screening every year starting at age 11 if you have a 30-pack-year history of smoking and currently smoke or have quit within the past 15 years. Fecal occult blood test (FOBT) of the stool. You may have this test every year starting at age 59. Flexible sigmoidoscopy or colonoscopy. You may have a sigmoidoscopy every 5 years or a colonoscopy every 10 years starting at age 32.  Hepatitis C blood test. Hepatitis B blood test. Sexually transmitted disease (STD) testing. Diabetes screening. This is done by checking your blood sugar (glucose) after you have not eaten for a while (fasting). You may have this done every 1-3 years. Bone density scan. This is done to screen for osteoporosis. You may have this done starting at age 51. Mammogram. This may be done every 1-2  years. Talk to your health care provider about how often you should have regular mammograms. Talk with your health care provider about your test results, treatment options, and if necessary, the need for more tests. Vaccines  Your health care provider may recommend certain vaccines, such as: Influenza vaccine. This is recommended every year. Tetanus, diphtheria, and acellular pertussis (Tdap, Td) vaccine. You may need a Td booster every 10 years. Zoster vaccine. You may need this after age 67. Pneumococcal 13-valent conjugate (PCV13) vaccine. One dose is recommended after age 50. Pneumococcal polysaccharide (PPSV23) vaccine. One dose is recommended after age 83. Talk to your health care provider about which screenings and vaccines you need and how often you need them. This information is not intended to replace advice given to you by your health care provider. Make sure you discuss any questions you have with your health care provider. Document Released: 01/30/2015 Document Revised: 09/23/2015 Document Reviewed: 11/04/2014 Elsevier Interactive Patient Education  2017 [place] Prevention in the Home Falls can cause injuries. They can happen to people of all ages. There are many things you can do to make your home safe and to help prevent falls. What can I do on the outside of my home? Regularly fix the edges of walkways and driveways and fix any cracks. Remove anything that might make you trip as you walk through a door, such as a raised step or threshold. Trim any bushes or trees on the path to your home. Use bright outdoor lighting. Clear any walking paths of anything that might make someone trip, such as rocks or tools. Regularly check to see if handrails are loose or broken. Make sure that both sides of any steps have handrails. Any raised decks and porches should have guardrails on the edges. Have any leaves, snow, or ice cleared regularly. Use sand or salt on walking paths  during winter. Clean up any spills in your garage right away. This includes oil or grease spills. What can I do in the bathroom? Use night lights. Install grab bars by the toilet and in the tub and shower. Do not use towel bars as grab bars. Use non-skid mats or decals in the tub or shower. If you need to sit down in the shower, use a plastic, non-slip stool. Keep the floor dry. Clean up any water that spills on the floor as soon as it happens. Remove soap buildup in the tub or shower regularly. Attach bath mats securely with double-sided non-slip rug tape. Do not have throw rugs and other things on the floor that can make you trip. What can I do in the bedroom? Use night lights. Make sure that you have a light by your bed that is easy to reach. Do not use any sheets or blankets that are too big for your bed. They should not hang down onto the floor. Have a firm chair that has side arms. You can use this for support while you get dressed. Do not have throw rugs and other things on the floor that can make you trip. What can I do in  the kitchen? Clean up any spills right away. Avoid walking on wet floors. Keep items that you use a lot in easy-to-reach places. If you need to reach something above you, use a strong step stool that has a grab bar. Keep electrical cords out of the way. Do not use floor polish or wax that makes floors slippery. If you must use wax, use non-skid floor wax. Do not have throw rugs and other things on the floor that can make you trip. What can I do with my stairs? Do not leave any items on the stairs. Make sure that there are handrails on both sides of the stairs and use them. Fix handrails that are broken or loose. Make sure that handrails are as long as the stairways. Check any carpeting to make sure that it is firmly attached to the stairs. Fix any carpet that is loose or worn. Avoid having throw rugs at the top or bottom of the stairs. If you do have throw  rugs, attach them to the floor with carpet tape. Make sure that you have a light switch at the top of the stairs and the bottom of the stairs. If you do not have them, ask someone to add them for you. What else can I do to help prevent falls? Wear shoes that: Do not have high heels. Have rubber bottoms. Are comfortable and fit you well. Are closed at the toe. Do not wear sandals. If you use a stepladder: Make sure that it is fully opened. Do not climb a closed stepladder. Make sure that both sides of the stepladder are locked into place. Ask someone to hold it for you, if possible. Clearly mark and make sure that you can see: Any grab bars or handrails. First and last steps. Where the edge of each step is. Use tools that help you move around (mobility aids) if they are needed. These include: Canes. Walkers. Scooters. Crutches. Turn on the lights when you go into a dark area. Replace any light bulbs as soon as they burn out. Set up your furniture so you have a clear path. Avoid moving your furniture around. If any of your floors are uneven, fix them. If there are any pets around you, be aware of where they are. Review your medicines with your doctor. Some medicines can make you feel dizzy. This can increase your chance of falling. Ask your doctor what other things that you can do to help prevent falls. This information is not intended to replace advice given to you by your health care provider. Make sure you discuss any questions you have with your health care provider. Document Released: 10/30/2008 Document Revised: 06/11/2015 Document Reviewed: 02/07/2014 Elsevier Interactive Patient Education  2017 [employer].

## 2022-04-09 ENCOUNTER — Other Ambulatory Visit: Payer: Self-pay | Admitting: Internal Medicine

## 2022-05-02 ENCOUNTER — Telehealth: Payer: Self-pay | Admitting: Family Medicine

## 2022-05-02 DIAGNOSIS — M170 Bilateral primary osteoarthritis of knee: Secondary | ICD-10-CM

## 2022-05-02 DIAGNOSIS — G8929 Other chronic pain: Secondary | ICD-10-CM

## 2022-05-02 NOTE — Telephone Encounter (Signed)
Last BILAT Zilretta inj 12/23/21 Can consider repeat inj 03/18/22

## 2022-05-02 NOTE — Telephone Encounter (Signed)
Patient would like to have repeat Zilretta injections. Can you would on the authorization for this please?

## 2022-05-03 NOTE — Telephone Encounter (Signed)
Prior auth required for ZILRETTA for BILAT knee OA     

## 2022-05-04 DIAGNOSIS — Z79899 Other long term (current) drug therapy: Secondary | ICD-10-CM | POA: Diagnosis not present

## 2022-05-04 DIAGNOSIS — G894 Chronic pain syndrome: Secondary | ICD-10-CM | POA: Diagnosis not present

## 2022-05-04 DIAGNOSIS — M4325 Fusion of spine, thoracolumbar region: Secondary | ICD-10-CM | POA: Diagnosis not present

## 2022-05-04 DIAGNOSIS — Z79891 Long term (current) use of opiate analgesic: Secondary | ICD-10-CM | POA: Diagnosis not present

## 2022-05-04 NOTE — Telephone Encounter (Signed)
Prior Authorization initiated for [facility] via [email or web address] KEY: [id number]

## 2022-05-05 ENCOUNTER — Encounter: Payer: Self-pay | Admitting: Family Medicine

## 2022-05-05 ENCOUNTER — Ambulatory Visit (INDEPENDENT_AMBULATORY_CARE_PROVIDER_SITE_OTHER): Payer: Medicare PPO | Admitting: Family Medicine

## 2022-05-05 ENCOUNTER — Other Ambulatory Visit: Payer: Self-pay

## 2022-05-05 VITALS — BP 98/64 | HR 66 | Ht 66.0 in | Wt 175.2 lb

## 2022-05-05 DIAGNOSIS — M170 Bilateral primary osteoarthritis of knee: Secondary | ICD-10-CM | POA: Diagnosis not present

## 2022-05-05 DIAGNOSIS — M25561 Pain in right knee: Secondary | ICD-10-CM | POA: Diagnosis not present

## 2022-05-05 DIAGNOSIS — G8929 Other chronic pain: Secondary | ICD-10-CM

## 2022-05-05 DIAGNOSIS — M25562 Pain in left knee: Secondary | ICD-10-CM

## 2022-05-05 MED ORDER — TRIAMCINOLONE ACETONIDE 32 MG IX SRER
32.0000 mg | Freq: Once | INTRA_ARTICULAR | Status: AC
  Administered 2022-05-05: 32 mg via INTRA_ARTICULAR

## 2022-05-05 NOTE — Patient Instructions (Signed)
Thank you for coming in today.   You received an injection today. Seek immediate medical attention if the joint becomes red, extremely painful, or is oozing fluid.   Check back as needed 

## 2022-05-05 NOTE — Progress Notes (Signed)
Rubin Payor, PhD, LAT, ATC acting as a scribe for Clementeen Graham, MD.  [name] is a 77 y.o. female who presents to [facility] today for cont'd chronic bilat knee pain. Pt was last seen by Dr. Denyse Amass on 12/23/21 and was given bilat Zilretta injections. Today, pt reports knee pain returning a while ago. Pt is wanting to repeat the Zilretta injections today. Some visible swelling. Reports pain as severe.  She thinks that Zilretta shots lasted about 3 months.  Dx imaging: 11/21/19 R & L knee XR   Pertinent review of systems: No fevers or chills  Relevant historical information: Hypertension.   Exam:  BP 98/64   Pulse 66   Ht  (1.676 m)   Wt 175 lb 3.2 oz (79.5 kg)   SpO2 96%   BMI 28.28 kg/m  General: Well Developed, well nourished, and in no acute distress.   MSK: Bilateral knees moderate to large joint effusion.  Diffusely tender decreased range of motion.  Significant antalgic gait.    Lab and Radiology Results  Zilretta injection bilateral knees Procedure: Real-time Ultrasound Guided Injection of right knee joint superior lateral patellar space Device: Philips Affiniti 50G Images permanently stored and available for review in PACS Verbal informed consent obtained.  Discussed risks and benefits of procedure. Warned about infection, bleeding, hyperglycemia damage to structures among others. Patient expresses understanding and agreement Time-out conducted.   Noted no overlying erythema, induration, or other signs of local infection.   Skin prepped in a sterile fashion.   Local anesthesia: Topical Ethyl chloride.   With sterile technique and under real time ultrasound guidance: Zilretta 32 mg injected into knee joint. Fluid seen entering the joint capsule.   Completed without difficulty   Advised to call if fevers/chills, erythema, induration, drainage, or persistent bleeding.   Images permanently stored and available for review in  the ultrasound unit.  Impression: Technically successful ultrasound guided injection.  Procedure: Real-time Ultrasound Guided Injection of left knee joint superior lateral patellar space Device: Philips Affiniti 50G Images permanently stored and available for review in PACS Verbal informed consent obtained.  Discussed risks and benefits of procedure. Warned about infection, bleeding, hyperglycemia damage to structures among others. Patient expresses understanding and agreement Time-out conducted.   Noted no overlying erythema, induration, or other signs of local infection.   Skin prepped in a sterile fashion.   Local anesthesia: Topical Ethyl chloride.   With sterile technique and under real time ultrasound guidance: Zilretta 32 mg injected into knee joint. Fluid seen entering the joint capsule.   Completed without difficulty    Advised to call if fevers/chills, erythema, induration, drainage, or persistent bleeding.   Images permanently stored and available for review in the ultrasound unit.  Impression: Technically successful ultrasound guided injection.  Lot number: [id number] for both injections       Assessment and Plan: 77 y.o. female with bilateral knee pain due to DJD.  This is an acute exacerbation of a chronic problem.  Plan for bilateral Zilretta injections now.  It has been over 4 months since her last injection.  The injection seems to last about 3 months for her.  Will anticipate repeat injection in about 3 months.  Will go ahead and set a reminder to start getting the medication authorized at that time and informed the patient. I would like her to be here before gets severe next time.  PDMP not reviewed this encounter. Orders Placed This  Encounter  Procedures   [place] LIMITED JOINT SPACE STRUCTURES LOW BILAT(NO LINKED CHARGES)    Order Specific Question:   Reason for Exam (SYMPTOM  OR DIAGNOSIS REQUIRED)    Answer:   bilateral knee pain    Order Specific Question:    Preferred imaging location?    Answer:   [facility] ordered this encounter  Medications   Triamcinolone Acetonide (ZILRETTA) intra-articular injection 32 mg   Triamcinolone Acetonide (ZILRETTA) intra-articular injection 32 mg     Discussed warning signs or symptoms. Please see discharge instructions. Patient expresses understanding.   The above documentation has been reviewed and is accurate and complete Clementeen Graham, M.D.

## 2022-05-05 NOTE — Telephone Encounter (Signed)
Zilretta for BILAT knee OA  Primary Insurance: [employer] Medicare Adv Co-Pay: $40 Co-Insurance: 0% Deductible: does not apply  Prior Auth: [place]PR[place]ED [place] Case ID #: [id number] Valid: 05/04/22-11/03/22

## 2022-05-05 NOTE — Telephone Encounter (Addendum)
APPROVED  Key: [id number] [place] Case ID #: [id number] Valid: 05/04/22-11/03/22

## 2022-05-05 NOTE — Telephone Encounter (Signed)
Last BILAT Zilretta inj 05/05/22 Can consider repeat inj on or after 07/29/22

## 2022-05-09 NOTE — Telephone Encounter (Signed)
Rodolph Bong, MD  Dierdre Searles, CMA Please set a reminder or something so she will be ready to go for Zilretta BL knees in 3 months. Can you call her when we are at 3 months and ready so she remembers to come in before her knees are in crisis?

## 2022-06-02 DIAGNOSIS — Z961 Presence of intraocular lens: Secondary | ICD-10-CM | POA: Diagnosis not present

## 2022-06-03 ENCOUNTER — Ambulatory Visit (INDEPENDENT_AMBULATORY_CARE_PROVIDER_SITE_OTHER): Payer: Medicare PPO | Admitting: Internal Medicine

## 2022-06-03 ENCOUNTER — Encounter: Payer: Self-pay | Admitting: Internal Medicine

## 2022-06-03 VITALS — BP 128/78 | HR 67 | Temp 98.3°F | Ht 66.0 in | Wt 173.0 lb

## 2022-06-03 DIAGNOSIS — E559 Vitamin D deficiency, unspecified: Secondary | ICD-10-CM | POA: Diagnosis not present

## 2022-06-03 DIAGNOSIS — J069 Acute upper respiratory infection, unspecified: Secondary | ICD-10-CM | POA: Diagnosis not present

## 2022-06-03 DIAGNOSIS — I10 Essential (primary) hypertension: Secondary | ICD-10-CM

## 2022-06-03 DIAGNOSIS — E1165 Type 2 diabetes mellitus with hyperglycemia: Secondary | ICD-10-CM

## 2022-06-03 LAB — POC COVID19 BINAXNOW: SARS Coronavirus 2 Ag: NEGATIVE

## 2022-06-03 MED ORDER — LEVOFLOXACIN 500 MG PO TABS: 500.0000 mg | ORAL_TABLET | Freq: Every day | ORAL | 0 refills | Status: DC

## 2022-06-03 MED ORDER — HYDROCODONE BIT-HOMATROP MBR 5-1.5 MG/5ML PO SOLN: 5.0000 mL | Freq: Four times a day (QID) | ORAL | 0 refills | Status: AC | PRN

## 2022-06-03 NOTE — Progress Notes (Unsigned)
Patient ID: [name], female   DOB: 1945-05-25, 77 y.o.   MRN: [id number]        Chief Complaint: follow up URI, dm, htn, low vit d       HPI:  [name] is a 77 y.o. female  Here with 2-3 days acute onset fever, facial pain, pressure, headache, general weakness and malaise, and greenish d/c, with mild ST and cough, but pt denies chest pain, wheezing, increased sob or doe, orthopnea, PND, increased LE swelling, palpitations, dizziness or syncope.   Pt denies polydipsia, polyuria, or new focal neuro s/s.    Pt denies fever, wt loss, night sweats, loss of appetite, or other constitutional symptoms         Wt Readings from Last 3 Encounters:  06/03/22 173 lb (78.5 kg)  05/05/22 175 lb 3.2 oz (79.5 kg)  04/05/22 179 lb (81.2 kg)   BP Readings from Last 3 Encounters:  06/03/22 128/78  05/05/22 98/64  02/10/22 118/64         Past Medical History:  Diagnosis Date   AAA (abdominal aortic aneurysm) (HCC)    a. s/p stent graft repair 01/2016.   Acute ischemic colitis (HCC) 07/29/2010   Diagnosed June, 2012 characterized by acute lower GI bleeding, spontaneously resolved.    Acute respiratory infection 06/09/2014   Acute sinus infection 05/14/2015   Allergic rhinitis, cause unspecified    Angioedema of lips 06/03/2014   Anxiety state, unspecified    Arthritis of left hip 04/16/2015   Bilateral hearing loss 07/19/2017   Chronic LBP    Coronary artery disease    a. inferior MI 1998 s/p PCI of RCA. b. stenting of Cx 04/2010. c. DES to prox LAD 05/2013. d. DES x 3 in 10/2015. // Myoview 07/2018:  EF 59, normal perfusion, low risk    Coronary artery disease involving native heart without angina pectoris 07/31/2006   Qualifier: Diagnosis of  By: Debby Bud MD, Rosalyn Gess  ANGIOGRAPHIC DATA:  1. Ventriculography done in the RAO projection reveals a small wall  motion abnormality in the mid inferior wall. Ejection fraction  would be estimated at around 50%.  2. The right coronary artery has some progressive  disease of about 60-  80% in the proximal mid segment. The distal vessel appears to be  50-70% narrowing althou   Degeneration of lumbar or lumbosacral intervertebral disc    Depressive disorder, not elsewhere classified    Diabetes (HCC) 11/03/2006   Qualifier: Diagnosis of  By: Jonny Ruiz MD, Len Blalock    Diabetes mellitus    TYPE II   Frequent urination 05/08/2014   Gout 08/22/2013   History of endovascular stent graft for abdominal aortic aneurysm (AAA) 01/22/2016   Hyperlipidemia    Hypertension    Hypothyroidism    Iron deficiency anemia 06/13/2017   Lower GI bleed 06/2010   Diverticular bleed   Lumbar radiculopathy 05/15/2015   MENOPAUSAL DISORDER 10/29/2007   Qualifier: Diagnosis of  By: Jonny Ruiz MD, Len Blalock    Myalgia 09/25/2013   Noncompliance with medications 02/26/2014   Obesity, unspecified    OTITIS MEDIA, SEROUS, CHRONIC 04/23/2007   Qualifier: Diagnosis of  By: Debby Bud MD, Rosalyn Gess    Thoracic disc disease 11/19/2018   URTICARIA 09/23/2009   Qualifier: Diagnosis of  By: Felicity Coyer MD, Vikki Ports A    Venous insufficiency 07/19/2017   Past Surgical History:  Procedure Laterality Date   CARDIAC CATHETERIZATION     PCI OF BOTH THE CIRCUMFLEX  AND LEFT ANTERIOR DESCENDING ARTERY   CARDIAC CATHETERIZATION N/A 10/29/2015   Procedure: Coronary Stent Intervention;  Surgeon: Yvonne Kendall, MD;  Location: [facility] INVASIVE CV LAB;  Service: Cardiovascular;  Laterality: N/A;   CARDIAC CATHETERIZATION N/A 10/29/2015   Procedure: Coronary/Graft Angiography;  Surgeon: Yvonne Kendall, MD;  Location: [facility] INVASIVE CV LAB;  Service: Cardiovascular;  Laterality: N/A;   CARDIAC CATHETERIZATION N/A 10/29/2015   Procedure: Intravascular Pressure Wire/FFR Study;  Surgeon: Yvonne Kendall, MD;  Location: [facility] INVASIVE CV LAB;  Service: Cardiovascular;  Laterality: N/A;   CESAREAN SECTION     ENDOVASCULAR STENT INSERTION N/A 01/22/2016   Procedure: ABDOMINAL AORTIC ENDOVASCULAR STENT GRAFT INSERTION;  Surgeon: Larina Earthly, MD;   Location: [facility] OR;  Service: Vascular;  Laterality: N/A;   HEART STENT  04-2010  and  Jun 07, 2013   X 3   LEFT HEART CATH AND CORONARY ANGIOGRAPHY N/A 04/03/2019   Procedure: LEFT HEART CATH AND CORONARY ANGIOGRAPHY;  Surgeon: Yvonne Kendall, MD;  Location: [facility] INVASIVE CV LAB;  Service: Cardiovascular;  Laterality: N/A;   LEFT HEART CATHETERIZATION WITH CORONARY ANGIOGRAM N/A 06/07/2013   Procedure: LEFT HEART CATHETERIZATION WITH CORONARY ANGIOGRAM;  Surgeon: Kathleene Hazel, MD;  Location: [facility] CATH LAB;  Service: Cardiovascular;  Laterality: N/A;   LEFT HEART CATHETERIZATION WITH CORONARY ANGIOGRAM N/A 02/25/2014   Procedure: LEFT HEART CATHETERIZATION WITH CORONARY ANGIOGRAM;  Surgeon: Lennette Bihari, MD;  Location: [facility] CATH LAB;  Service: Cardiovascular;  Laterality: N/A;   LUMBAR FUSION  01/2007   DR. Noel Gerold...3-LEVEL WITH FIXATION   OOPHORECTOMY     BSO? pt.unsure   PARATHYROIDECTOMY     SPINE SURGERY     THYROIDECTOMY     TOTAL ABDOMINAL HYSTERECTOMY      reports that she quit smoking about 36 years ago. Her smoking use included cigarettes. She has never used smokeless tobacco. She reports that she does not drink alcohol and does not use drugs. family history includes Arthritis in her maternal aunt; Breast cancer in her maternal aunt; Diabetes in her brother and maternal aunt; Heart attack (age of onset: 51) in her mother; Heart attack (age of onset: 69) in her father; Heart disease in her father and mother. Allergies  Allergen Reactions   Crestor [Rosuvastatin Calcium] Other (See Comments)    Feeling poor   Prilosec [Omeprazole] Other (See Comments)    Chest pain   Miconazole Nitrate Hives    REACTION: hives   Augmentin [Amoxicillin-Pot Clavulanate] Hives, Itching and Rash    Has patient had a PCN reaction causing immediate rash, facial/tongue/throat swelling, SOB or lightheadedness with hypotension:unsure Has patient had a PCN reaction causing severe rash involving mucus  membranes or skin necrosis:unsure Has patient had a PCN reaction that required hospitalization:No Has patient had a PCN reaction occurring within the last 10 years:NO If all of the above answers are "NO", then may proceed with Cephalosporin use. Has patient had a PCN reaction causing immediate rash, facial/tongue/throat swelling, SOB or lightheadedness with hypotension:unsure Has patient had a PCN reaction causing severe rash involving mucus membranes or skin necrosis:unsure Has patient had a PCN reaction that required hospitalization:No Has patient had a PCN reaction occurring within the last 10 years:NO If all of the above answers are "NO", then may proceed with Cephalosporin use.    Doxycycline Other (See Comments)    REACTION: gi upset   Current Outpatient Medications on File Prior to Visit  Medication Sig Dispense Refill   allopurinol (ZYLOPRIM) 300 MG tablet  Take 1 tablet (300 mg total) by mouth as needed (for Gout). 90 tablet 3   aspirin 81 MG EC tablet Take 81 mg by mouth daily.      atenolol (TENORMIN) 25 MG tablet Take 1 tablet (25 mg total) by mouth daily. 90 tablet 3   atorvastatin (LIPITOR) 80 MG tablet Take 1 tablet (80 mg total) by mouth daily.  1   celecoxib (CELEBREX) 200 MG capsule Take 1 capsule (200 mg total) by mouth 2 (two) times daily as needed for moderate pain. 180 capsule 1   Cholecalciferol (THERA-D 2000) 50 MCG (2000 UT) TABS 1 tab by mouth once daily 90 tablet 99   ezetimibe (ZETIA) 10 MG tablet Take 1 tablet (10 mg total) by mouth daily. 90 tablet 3   fluconazole (DIFLUCAN) 150 MG tablet 1 tab by mouth every 3 days as needed 2 tablet 1   furosemide (LASIX) 40 MG tablet Take 1 tablet (40 mg total) by mouth daily. (Patient taking differently: Take 40 mg by mouth daily as needed for fluid.) 90 tablet 3   gabapentin (NEURONTIN) 300 MG capsule Take 1-2 tab by mouth at bedtime for pain and sleep 180 capsule 1   nitroGLYCERIN (NITROSTAT) 0.4 MG SL tablet Place 1 tablet  (0.4 mg total) under the tongue every 5 (five) minutes as needed for chest pain. 25 tablet 3   oxyCODONE (OXYCONTIN) 10 mg 12 hr tablet Take 1 tablet (10 mg total) by mouth every 12 (twelve) hours. 14 tablet 0   pantoprazole (PROTONIX) 40 MG tablet Take 40 mg by mouth daily as needed (heartburn acid reflux).     potassium chloride (KLOR-CON) 10 MEQ tablet Take 1 tablet (10 mEq total) by mouth daily. 90 tablet 3   pregabalin (LYRICA) 75 MG capsule Take 75 mg by mouth daily as needed (pain).      SYNTHROID 150 MCG tablet TAKE 1 TABLET(150 MCG) BY MOUTH DAILY 90 tablet 3   tiZANidine (ZANAFLEX) 2 MG tablet Take 1 tablet (2 mg total) by mouth every 6 (six) hours as needed for muscle spasms. 40 tablet 1   triamcinolone (NASACORT) 55 MCG/ACT AERO nasal inhaler Place 2 sprays into the nose as needed (congestion). 1 each 12   isosorbide mononitrate (IMDUR) 30 MG 24 hr tablet Take 0.5 tablets (15 mg total) by mouth daily. 45 tablet 3   No current facility-administered medications on file prior to visit.        ROS:  All others reviewed and negative.  Objective        PE:  BP 128/78 (BP Location: Right Arm, Patient Position: Sitting, Cuff Size: Normal)   Pulse 67   Temp 98.3 F (36.8 C) (Oral)   Ht 5\' 6"  (1.676 m)   Wt 173 lb (78.5 kg)   SpO2 97%   BMI 27.92 kg/m                 Constitutional: Pt appears in NAD               HENT: Head: NCAT.                Right Ear: External ear normal.                 Left Ear: External ear normal. Bilat tm's with mild erythema.  Max sinus areas mild tender.  Pharynx with mild erythema, no exudate  Eyes: . Pupils are equal, round, and reactive to light. Conjunctivae and EOM are normal               Nose: without d/c or deformity               Neck: Neck supple. Gross normal ROM               Cardiovascular: Normal rate and regular rhythm.                 Pulmonary/Chest: Effort normal and breath sounds without rales or wheezing.                 Abd:  Soft, NT, ND, + BS, no organomegaly               Neurological: Pt is alert. At baseline orientation, motor grossly intact               Skin: Skin is warm. No rashes, no other new lesions, LE edema - none               Psychiatric: Pt behavior is normal without agitation   Micro: none  Cardiac tracings I have personally interpreted today:  none  Pertinent Radiological findings (summarize): none   Lab Results  Component Value Date   WBC 7.1 02/10/2022   HGB 13.8 02/10/2022   HCT 42.6 02/10/2022   PLT 274.0 02/10/2022   GLUCOSE 118 (H) 02/10/2022   CHOL 162 02/10/2022   TRIG 88.0 02/10/2022   HDL 35.40 (L) 02/10/2022   LDLDIRECT 131.0 03/27/2015   LDLCALC 109 (H) 02/10/2022   ALT 7 02/10/2022   AST 15 02/10/2022   NA 139 02/10/2022   K 3.6 02/10/2022   CL 100 02/10/2022   CREATININE 0.59 02/10/2022   BUN 5 (L) 02/10/2022   CO2 30 02/10/2022   TSH 0.97 02/10/2022   INR 1.08 01/22/2016   HGBA1C 5.6 02/10/2022   MICROALBUR <0.7 02/10/2022   POCT - COVID - neg  Assessment/Plan:  [name] is a 77 y.o. Black or African American [2] female with  has a past medical history of AAA (abdominal aortic aneurysm) (HCC), Acute ischemic colitis (HCC) (07/29/2010), Acute respiratory infection (06/09/2014), Acute sinus infection (05/14/2015), Allergic rhinitis, cause unspecified, Angioedema of lips (06/03/2014), Anxiety state, unspecified, Arthritis of left hip (04/16/2015), Bilateral hearing loss (07/19/2017), Chronic LBP, Coronary artery disease, Coronary artery disease involving native heart without angina pectoris (07/31/2006), Degeneration of lumbar or lumbosacral intervertebral disc, Depressive disorder, not elsewhere classified, Diabetes (HCC) (11/03/2006), Diabetes mellitus, Frequent urination (05/08/2014), Gout (08/22/2013), History of endovascular stent graft for abdominal aortic aneurysm (AAA) (01/22/2016), Hyperlipidemia, Hypertension, Hypothyroidism, Iron deficiency anemia  (06/13/2017), Lower GI bleed (06/2010), Lumbar radiculopathy (05/15/2015), MENOPAUSAL DISORDER (10/29/2007), Myalgia (09/25/2013), Noncompliance with medications (02/26/2014), Obesity, unspecified, OTITIS MEDIA, SEROUS, CHRONIC (04/23/2007), Thoracic disc disease (11/19/2018), URTICARIA (09/23/2009), and Venous insufficiency (07/19/2017).  Acute upper respiratory infection Mild to mod, for antibx course lervaquin 500 qd, cough med prn,,  to f/u any worsening symptoms or concerns  Diabetes [facility]) Lab Results  Component Value Date   HGBA1C 5.6 02/10/2022   Stable, pt to continue current medical treatment  - diet, wt control   Essential hypertension BP Readings from Last 3 Encounters:  06/03/22 128/78  05/05/22 98/64  02/10/22 118/64   Stable, pt to continue medical treatment tenormin 25 mg qd   Vitamin D deficiency Last vitamin D Lab Results  Component Value Date   VD25OH 10.59 (  L) 02/10/2022   Low, to start oral replacement  Followup: Return if symptoms worsen or fail to improve.  Oliver Barre, MD 06/05/2022 8:23 PM [facility] - [facility] - [facility] Medicine

## 2022-06-03 NOTE — Patient Instructions (Addendum)
Your COVID test was negative  Please take all new medication as prescribed -the antibiotic, and cough medicine as needed  Please continue all other medications as before, and refills have been done if requested.  Please have the pharmacy call with any other refills you may need.  Please keep your appointments with your specialists as you may have planned

## 2022-06-05 ENCOUNTER — Encounter: Payer: Self-pay | Admitting: Internal Medicine

## 2022-06-05 NOTE — Assessment & Plan Note (Signed)
Last vitamin D Lab Results  Component Value Date   VD25OH 10.59 (L) 02/10/2022  Low, to start oral replacement  

## 2022-06-05 NOTE — Assessment & Plan Note (Signed)
BP Readings from Last 3 Encounters:  06/03/22 128/78  05/05/22 98/64  02/10/22 118/64   Stable, pt to continue medical treatment tenormin 25 mg qd

## 2022-06-05 NOTE — Assessment & Plan Note (Signed)
Lab Results  Component Value Date   HGBA1C 5.6 02/10/2022   Stable, pt to continue current medical treatment  - diet, wt control  

## 2022-06-05 NOTE — Assessment & Plan Note (Signed)
Mild to mod, for antibx course lervaquin 500 qd, cough med prn,,  to f/u any worsening symptoms or concerns

## 2022-06-09 ENCOUNTER — Encounter: Payer: Self-pay | Admitting: Internal Medicine

## 2022-06-09 ENCOUNTER — Ambulatory Visit: Payer: Medicare PPO | Admitting: Internal Medicine

## 2022-06-09 VITALS — BP 140/80 | HR 79 | Temp 98.4°F | Ht 66.0 in | Wt 174.0 lb

## 2022-06-09 DIAGNOSIS — I10 Essential (primary) hypertension: Secondary | ICD-10-CM | POA: Diagnosis not present

## 2022-06-09 DIAGNOSIS — E559 Vitamin D deficiency, unspecified: Secondary | ICD-10-CM | POA: Diagnosis not present

## 2022-06-09 DIAGNOSIS — E782 Mixed hyperlipidemia: Secondary | ICD-10-CM

## 2022-06-09 DIAGNOSIS — M1712 Unilateral primary osteoarthritis, left knee: Secondary | ICD-10-CM

## 2022-06-09 DIAGNOSIS — E538 Deficiency of other specified B group vitamins: Secondary | ICD-10-CM

## 2022-06-09 DIAGNOSIS — R600 Localized edema: Secondary | ICD-10-CM

## 2022-06-09 MED ORDER — CYANOCOBALAMIN 1000 MCG/ML IJ SOLN
1000.0000 ug | Freq: Once | INTRAMUSCULAR | Status: AC
  Administered 2022-06-09: 1000 ug via INTRAMUSCULAR

## 2022-06-09 MED ORDER — REPATHA 140 MG/ML ~~LOC~~ SOSY: PREFILLED_SYRINGE | SUBCUTANEOUS | 11 refills | Status: DC

## 2022-06-09 MED ORDER — KETOROLAC TROMETHAMINE 30 MG/ML IJ SOLN
30.0000 mg | Freq: Once | INTRAMUSCULAR | Status: AC
  Administered 2022-06-09: 30 mg via INTRAMUSCULAR

## 2022-06-09 NOTE — Progress Notes (Signed)
Patient ID: [name], female   DOB: [birth year], 77 y.o.   MRN: [id number]        Chief Complaint: follow up low b12, right ear pain, hld, djd       HPI:  [name] is a 77 y.o. female here with c/o right ear pain now mild improving, without fever or worsening d/c.  Pt denies chest pain, increased sob or doe, wheezing, orthopnea, PND, increased LE swelling, palpitations, dizziness or syncope.   Pt denies polydipsia, polyuria, or new focal neuro s/s.   Has had midl to mod worsening bilateral knee pain, asking for toradol today.  Due for b12 IM.  Not taking zetia and does not want to for now, but may be wiling to take repatha.         Wt Readings from Last 3 Encounters:  06/10/22 175 lb (79.4 kg)  06/09/22 174 lb (78.9 kg)  06/03/22 173 lb (78.5 kg)   BP Readings from Last 3 Encounters:  06/10/22 (!) 100/56  06/09/22 (!) 140/80  06/03/22 128/78         Past Medical History:  Diagnosis Date   AAA (abdominal aortic aneurysm) (HCC)    a. s/p stent graft repair 01/2016.   Acute ischemic colitis (HCC) 07/29/2010   Diagnosed June, 2012 characterized by acute lower GI bleeding, spontaneously resolved.    Acute respiratory infection 06/09/2014   Acute sinus infection 05/14/2015   Allergic rhinitis, cause unspecified    Angioedema of lips 06/03/2014   Anxiety state, unspecified    Arthritis of left hip 04/16/2015   Bilateral hearing loss 07/19/2017   Chronic LBP    Coronary artery disease    a. inferior MI 1998 s/p PCI of RCA. b. stenting of Cx 04/2010. c. DES to prox LAD 05/2013. d. DES x 3 in 10/2015. // Myoview 07/2018:  EF 59, normal perfusion, low risk    Coronary artery disease involving native heart without angina pectoris 07/31/2006   Qualifier: Diagnosis of  By: Debby Bud MD, Rosalyn Gess  ANGIOGRAPHIC DATA:  1. Ventriculography done in the RAO projection reveals a small wall  motion abnormality in the mid inferior wall. Ejection fraction  would be estimated at around 50%.  2. The right  coronary artery has some progressive disease of about 60-  80% in the proximal mid segment. The distal vessel appears to be  50-70% narrowing althou   Degeneration of lumbar or lumbosacral intervertebral disc    Depressive disorder, not elsewhere classified    Diabetes (HCC) 11/03/2006   Qualifier: Diagnosis of  By: Jonny Ruiz MD, Len Blalock    Diabetes mellitus    TYPE II   Frequent urination 05/08/2014   Gout 08/22/2013   History of endovascular stent graft for abdominal aortic aneurysm (AAA) 01/22/2016   Hyperlipidemia    Hypertension    Hypothyroidism    Iron deficiency anemia 06/13/2017   Lower GI bleed 06/2010   Diverticular bleed   Lumbar radiculopathy 05/15/2015   MENOPAUSAL DISORDER 10/29/2007   Qualifier: Diagnosis of  By: Jonny Ruiz MD, Len Blalock    Myalgia 09/25/2013   Noncompliance with medications 02/26/2014   Obesity, unspecified    OTITIS MEDIA, SEROUS, CHRONIC 04/23/2007   Qualifier: Diagnosis of  By: Debby Bud MD, Rosalyn Gess    Thoracic disc disease 11/19/2018   URTICARIA 09/23/2009   Qualifier: Diagnosis of  By: Felicity Coyer MD, Vikki Ports A    Venous insufficiency 07/19/2017   Past Surgical History:  Procedure Laterality Date  CARDIAC CATHETERIZATION     PCI OF BOTH THE CIRCUMFLEX AND LEFT ANTERIOR DESCENDING ARTERY   CARDIAC CATHETERIZATION N/A 10/29/2015   Procedure: Coronary Stent Intervention;  Surgeon: Yvonne Kendall, MD;  Location: [facility] INVASIVE CV LAB;  Service: Cardiovascular;  Laterality: N/A;   CARDIAC CATHETERIZATION N/A 10/29/2015   Procedure: Coronary/Graft Angiography;  Surgeon: Yvonne Kendall, MD;  Location: [facility] INVASIVE CV LAB;  Service: Cardiovascular;  Laterality: N/A;   CARDIAC CATHETERIZATION N/A 10/29/2015   Procedure: Intravascular Pressure Wire/FFR Study;  Surgeon: Yvonne Kendall, MD;  Location: [facility] INVASIVE CV LAB;  Service: Cardiovascular;  Laterality: N/A;   CESAREAN SECTION     ENDOVASCULAR STENT INSERTION N/A 01/22/2016   Procedure: ABDOMINAL AORTIC ENDOVASCULAR STENT GRAFT  INSERTION;  Surgeon: Larina Earthly, MD;  Location: [facility] OR;  Service: Vascular;  Laterality: N/A;   HEART STENT  04-2010  and  Jun 07, 2013   X 3   LEFT HEART CATH AND CORONARY ANGIOGRAPHY N/A 04/03/2019   Procedure: LEFT HEART CATH AND CORONARY ANGIOGRAPHY;  Surgeon: Yvonne Kendall, MD;  Location: [facility] INVASIVE CV LAB;  Service: Cardiovascular;  Laterality: N/A;   LEFT HEART CATHETERIZATION WITH CORONARY ANGIOGRAM N/A 06/07/2013   Procedure: LEFT HEART CATHETERIZATION WITH CORONARY ANGIOGRAM;  Surgeon: Kathleene Hazel, MD;  Location: [facility] CATH LAB;  Service: Cardiovascular;  Laterality: N/A;   LEFT HEART CATHETERIZATION WITH CORONARY ANGIOGRAM N/A 02/25/2014   Procedure: LEFT HEART CATHETERIZATION WITH CORONARY ANGIOGRAM;  Surgeon: Lennette Bihari, MD;  Location: [facility] CATH LAB;  Service: Cardiovascular;  Laterality: N/A;   LUMBAR FUSION  01/2007   DR. Noel Gerold...3-LEVEL WITH FIXATION   OOPHORECTOMY     BSO? pt.unsure   PARATHYROIDECTOMY     SPINE SURGERY     THYROIDECTOMY     TOTAL ABDOMINAL HYSTERECTOMY      reports that she quit smoking about 36 years ago. Her smoking use included cigarettes. She has never used smokeless tobacco. She reports that she does not drink alcohol and does not use drugs. family history includes Arthritis in her maternal aunt; Breast cancer in her maternal aunt; Diabetes in her brother and maternal aunt; Heart attack (age of onset: 55) in her mother; Heart attack (age of onset: 6) in her father; Heart disease in her father and mother. Allergies  Allergen Reactions   Crestor [Rosuvastatin Calcium] Other (See Comments)    Feeling poor   Prilosec [Omeprazole] Other (See Comments)    Chest pain   Miconazole Nitrate Hives    REACTION: hives   Augmentin [Amoxicillin-Pot Clavulanate] Hives, Itching and Rash    Has patient had a PCN reaction causing immediate rash, facial/tongue/throat swelling, SOB or lightheadedness with hypotension:unsure Has patient had a PCN reaction  causing severe rash involving mucus membranes or skin necrosis:unsure Has patient had a PCN reaction that required hospitalization:No Has patient had a PCN reaction occurring within the last 10 years:NO If all of the above answers are "NO", then may proceed with Cephalosporin use. Has patient had a PCN reaction causing immediate rash, facial/tongue/throat swelling, SOB or lightheadedness with hypotension:unsure Has patient had a PCN reaction causing severe rash involving mucus membranes or skin necrosis:unsure Has patient had a PCN reaction that required hospitalization:No Has patient had a PCN reaction occurring within the last 10 years:NO If all of the above answers are "NO", then may proceed with Cephalosporin use.    Doxycycline Other (See Comments)    REACTION: gi upset   Current Outpatient Medications on File Prior to Visit  Medication Sig Dispense Refill   allopurinol (ZYLOPRIM) 300 MG tablet Take 1 tablet (300 mg total) by mouth as needed (for Gout). 90 tablet 3   aspirin 81 MG EC tablet Take 81 mg by mouth daily.      atenolol (TENORMIN) 25 MG tablet Take 1 tablet (25 mg total) by mouth daily. 90 tablet 3   atorvastatin (LIPITOR) 80 MG tablet Take 1 tablet (80 mg total) by mouth daily.  1   celecoxib (CELEBREX) 200 MG capsule Take 1 capsule (200 mg total) by mouth 2 (two) times daily as needed for moderate pain. 180 capsule 1   Cholecalciferol (THERA-D 2000) 50 MCG (2000 UT) TABS 1 tab by mouth once daily (Patient taking differently: 1 tab by mouth once daily, prn) 90 tablet 99   fluconazole (DIFLUCAN) 150 MG tablet 1 tab by mouth every 3 days as needed 2 tablet 1   furosemide (LASIX) 40 MG tablet Take 1 tablet (40 mg total) by mouth daily. (Patient taking differently: Take 40 mg by mouth daily as needed for fluid.) 90 tablet 3   gabapentin (NEURONTIN) 300 MG capsule Take 1-2 tab by mouth at bedtime for pain and sleep 180 capsule 1   HYDROcodone bit-homatropine (HYCODAN) 5-1.5 MG/5ML  syrup Take 5 mLs by mouth every 6 (six) hours as needed for up to 10 days. 180 mL 0   isosorbide mononitrate (IMDUR) 30 MG 24 hr tablet Take 0.5 tablets (15 mg total) by mouth daily. 45 tablet 3   nitroGLYCERIN (NITROSTAT) 0.4 MG SL tablet Place 1 tablet (0.4 mg total) under the tongue every 5 (five) minutes as needed for chest pain. 25 tablet 3   oxyCODONE (OXYCONTIN) 10 mg 12 hr tablet Take 1 tablet (10 mg total) by mouth every 12 (twelve) hours. 14 tablet 0   pantoprazole (PROTONIX) 40 MG tablet Take 40 mg by mouth daily as needed (heartburn acid reflux).     potassium chloride (KLOR-CON) 10 MEQ tablet Take 1 tablet (10 mEq total) by mouth daily. (Patient taking differently: Take 10 mEq by mouth daily as needed.) 90 tablet 3   pregabalin (LYRICA) 75 MG capsule Take 75 mg by mouth daily as needed (pain).      SYNTHROID 150 MCG tablet TAKE 1 TABLET(150 MCG) BY MOUTH DAILY 90 tablet 3   tiZANidine (ZANAFLEX) 2 MG tablet Take 1 tablet (2 mg total) by mouth every 6 (six) hours as needed for muscle spasms. 40 tablet 1   triamcinolone (NASACORT) 55 MCG/ACT AERO nasal inhaler Place 2 sprays into the nose as needed (congestion). 1 each 12   No current facility-administered medications on file prior to visit.        ROS:  All others reviewed and negative.  Objective        PE:  BP (!) 140/80 (BP Location: Left Arm, Patient Position: Sitting)   Pulse 79   Temp 98.4 F (36.9 C) (Oral)   Ht 5\' 6"  (1.676 m)   Wt 174 lb (78.9 kg)   SpO2 97%   BMI 28.08 kg/m                 Constitutional: Pt appears in NAD               HENT: Head: NCAT.                Right Ear: External ear normal.                 Left  Ear: External ear normal.                Eyes: . Pupils are equal, round, and reactive to light. Conjunctivae and EOM are normal               Nose: without d/c or deformity               Neck: Neck supple. Gross normal ROM               Cardiovascular: Normal rate and regular rhythm.                  Pulmonary/Chest: Effort normal and breath sounds without rales or wheezing.                Abd:  Soft, NT, ND, + BS, no organomegaly               Neurological: Pt is alert. At baseline orientation, motor grossly intact               Skin: Skin is warm. No rashes, no other new lesions, LE edema - none               Psychiatric: Pt behavior is normal without agitation   Micro: none  Cardiac tracings I have personally interpreted today:  none  Pertinent Radiological findings (summarize): none   Lab Results  Component Value Date   WBC 7.1 02/10/2022   HGB 13.8 02/10/2022   HCT 42.6 02/10/2022   PLT 274.0 02/10/2022   GLUCOSE 118 (H) 02/10/2022   CHOL 162 02/10/2022   TRIG 88.0 02/10/2022   HDL 35.40 (L) 02/10/2022   LDLDIRECT 131.0 03/27/2015   LDLCALC 109 (H) 02/10/2022   ALT 7 02/10/2022   AST 15 02/10/2022   NA 139 02/10/2022   K 3.6 02/10/2022   CL 100 02/10/2022   CREATININE 0.59 02/10/2022   BUN 5 (L) 02/10/2022   CO2 30 02/10/2022   TSH 0.97 02/10/2022   INR 1.08 01/22/2016   HGBA1C 5.6 02/10/2022   MICROALBUR <0.7 02/10/2022   Assessment/Plan:  [name] is a 77 y.o. Black or African American [2] female with  has a past medical history of AAA (abdominal aortic aneurysm) (HCC), Acute ischemic colitis (HCC) (07/29/2010), Acute respiratory infection (06/09/2014), Acute sinus infection (05/14/2015), Allergic rhinitis, cause unspecified, Angioedema of lips (06/03/2014), Anxiety state, unspecified, Arthritis of left hip (04/16/2015), Bilateral hearing loss (07/19/2017), Chronic LBP, Coronary artery disease, Coronary artery disease involving native heart without angina pectoris (07/31/2006), Degeneration of lumbar or lumbosacral intervertebral disc, Depressive disorder, not elsewhere classified, Diabetes (HCC) (11/03/2006), Diabetes mellitus, Frequent urination (05/08/2014), Gout (08/22/2013), History of endovascular stent graft for abdominal aortic aneurysm (AAA) (01/22/2016),  Hyperlipidemia, Hypertension, Hypothyroidism, Iron deficiency anemia (06/13/2017), Lower GI bleed (06/2010), Lumbar radiculopathy (05/15/2015), MENOPAUSAL DISORDER (10/29/2007), Myalgia (09/25/2013), Noncompliance with medications (02/26/2014), Obesity, unspecified, OTITIS MEDIA, SEROUS, CHRONIC (04/23/2007), Thoracic disc disease (11/19/2018), URTICARIA (09/23/2009), and Venous insufficiency (07/19/2017).  Vitamin D deficiency Last vitamin D Lab Results  Component Value Date   VD25OH 10.59 (L) 02/10/2022   Low, to start oral replacement   Arthritis of left knee With mild to mod flare, for toradol 30 mg IM  B12 deficiency Due for B12 IM monthly - for shot today  Essential hypertension BP Readings from Last 3 Encounters:  06/10/22 (!) 100/56  06/09/22 (!) 140/80  06/03/22 128/78   Low normal today, asympt,, pt to continue medical treatment tenormin 25 mg qd  Mixed hyperlipidemia Lab Results  Component Value Date   LDLCALC 109 (H) 02/10/2022   Uncontrolled, does not want to take zetia 10 qd, ok for repatha 140 Im q 2 wks  Followup: Return in about 4 months (around 10/10/2022).  Oliver Barre, MD 06/11/2022 6:15 PM [facility]

## 2022-06-09 NOTE — Patient Instructions (Addendum)
You had the B12 and toradol pain shots today  Please take all new medication as prescribed - the repatha 140 mg every 2 wks for cholesterol  Please call for ENT referral next wk if not improved  Please continue all other medications as before, and refills have been done if requested.  Please have the pharmacy call with any other refills you may need.  Please continue your efforts at being more active, low cholesterol diet, and weight control.  Please keep your appointments with your specialists as you may have planned  Please make an Appointment to return in 4 months, or sooner if needed

## 2022-06-09 NOTE — Progress Notes (Signed)
Patient ID: [name], female   DOB: Nov 13, 1945, 77 y.o.   MRN: [id number]

## 2022-06-10 ENCOUNTER — Ambulatory Visit: Payer: Medicare PPO | Attending: Cardiovascular Disease | Admitting: Cardiovascular Disease

## 2022-06-10 ENCOUNTER — Encounter: Payer: Self-pay | Admitting: Cardiovascular Disease

## 2022-06-10 VITALS — BP 100/56 | HR 79 | Ht 66.0 in | Wt 175.0 lb

## 2022-06-10 DIAGNOSIS — I5032 Chronic diastolic (congestive) heart failure: Secondary | ICD-10-CM | POA: Diagnosis not present

## 2022-06-10 DIAGNOSIS — I25119 Atherosclerotic heart disease of native coronary artery with unspecified angina pectoris: Secondary | ICD-10-CM | POA: Diagnosis not present

## 2022-06-10 DIAGNOSIS — I10 Essential (primary) hypertension: Secondary | ICD-10-CM

## 2022-06-10 DIAGNOSIS — I714 Abdominal aortic aneurysm, without rupture, unspecified: Secondary | ICD-10-CM | POA: Diagnosis not present

## 2022-06-10 DIAGNOSIS — R072 Precordial pain: Secondary | ICD-10-CM | POA: Diagnosis not present

## 2022-06-10 DIAGNOSIS — E782 Mixed hyperlipidemia: Secondary | ICD-10-CM | POA: Diagnosis not present

## 2022-06-10 NOTE — Progress Notes (Signed)
Cardiology Office Note:    Date:  06/10/2022   ID:  [name], DOB [birth year], MRN [id number]  PCP:  Corwin Levins, MD   [facility] Providers Cardiologist:  Tonny Bollman, MD Cardiology APP:  Kennon Rounds     Referring MD: Corwin Levins, MD   Chief Complaint  Patient presents with   Coronary Artery Disease    History of Present Illness:    [name] is a 77 y.o. female presenting for follow-up evaluation.  The patient is here alone today.  Her cardiovascular problems are outlined below:  Coronary artery disease  s/p prior inf MI in 1998 tx with stent to the RCA S/p stent LCx in 2012 S/p stent to the LAD in 2015 S/p DES x 3 to the RCA 2017 Myoview 07/2018: low risk Cath 03/2019: dRCA diff dz'd supplying small/dz'd PDA+PL br (not ideal for PCI) >> med Rx (HFpEF) heart failure with preserved ejection fraction  Diabetes mellitus  Hypertension Hyperlipidemia AAA S/p EVAR 01/2016  The patient is here alone today.  She has been getting along okay.  She has a new grandson.  She enjoys spending time with him.  She is having some occasional chest discomfort in the right parasternal region.  This is not clearly exertional in nature.  Shortness of breath with exertion is unchanged.  The patient reports that she is lost some weight and that her appetite is not as good as in the past.  She has had some issues with her dentition and is working on getting dentures.  She denies orthopnea, PND, or leg swelling.  Past Medical History:  Diagnosis Date   AAA (abdominal aortic aneurysm) (HCC)    a. s/p stent graft repair 01/2016.   Acute ischemic colitis (HCC) 07/29/2010   Diagnosed June, 2012 characterized by acute lower GI bleeding, spontaneously resolved.    Acute respiratory infection 06/09/2014   Acute sinus infection 05/14/2015   Allergic rhinitis, cause unspecified    Angioedema of lips 06/03/2014   Anxiety state, unspecified    Arthritis of left hip 04/16/2015    Bilateral hearing loss 07/19/2017   Chronic LBP    Coronary artery disease    a. inferior MI 1998 s/p PCI of RCA. b. stenting of Cx 04/2010. c. DES to prox LAD 05/2013. d. DES x 3 in 10/2015. // Myoview 07/2018:  EF 59, normal perfusion, low risk    Coronary artery disease involving native heart without angina pectoris 07/31/2006   Qualifier: Diagnosis of  By: Debby Bud MD, Rosalyn Gess  ANGIOGRAPHIC DATA:  1. Ventriculography done in the RAO projection reveals a small wall  motion abnormality in the mid inferior wall. Ejection fraction  would be estimated at around 50%.  2. The right coronary artery has some progressive disease of about 60-  80% in the proximal mid segment. The distal vessel appears to be  50-70% narrowing althou   Degeneration of lumbar or lumbosacral intervertebral disc    Depressive disorder, not elsewhere classified    Diabetes (HCC) 11/03/2006   Qualifier: Diagnosis of  By: Jonny Ruiz MD, Len Blalock    Diabetes mellitus    TYPE II   Frequent urination 05/08/2014   Gout 08/22/2013   History of endovascular stent graft for abdominal aortic aneurysm (AAA) 01/22/2016   Hyperlipidemia    Hypertension    Hypothyroidism    Iron deficiency anemia 06/13/2017   Lower GI bleed 06/2010   Diverticular bleed   Lumbar radiculopathy  05/15/2015   MENOPAUSAL DISORDER 10/29/2007   Qualifier: Diagnosis of  By: Jonny Ruiz MD, Len Blalock    Myalgia 09/25/2013   Noncompliance with medications 02/26/2014   Obesity, unspecified    OTITIS MEDIA, SEROUS, CHRONIC 04/23/2007   Qualifier: Diagnosis of  By: Debby Bud MD, Rosalyn Gess    Thoracic disc disease 11/19/2018   URTICARIA 09/23/2009   Qualifier: Diagnosis of  By: Felicity Coyer MD, Vikki Ports A    Venous insufficiency 07/19/2017    Past Surgical History:  Procedure Laterality Date   CARDIAC CATHETERIZATION     PCI OF BOTH THE CIRCUMFLEX AND LEFT ANTERIOR DESCENDING ARTERY   CARDIAC CATHETERIZATION N/A 10/29/2015   Procedure: Coronary Stent Intervention;  Surgeon: Yvonne Kendall, MD;   Location: [facility] INVASIVE CV LAB;  Service: Cardiovascular;  Laterality: N/A;   CARDIAC CATHETERIZATION N/A 10/29/2015   Procedure: Coronary/Graft Angiography;  Surgeon: Yvonne Kendall, MD;  Location: [facility] INVASIVE CV LAB;  Service: Cardiovascular;  Laterality: N/A;   CARDIAC CATHETERIZATION N/A 10/29/2015   Procedure: Intravascular Pressure Wire/FFR Study;  Surgeon: Yvonne Kendall, MD;  Location[facility] INVASIVE CV LAB;  Service: Cardiovascular;  Laterality: N/A;   CESAREAN SECTION     ENDOVASCULAR STENT INSERTION N/A 01/22/2016   Procedure: ABDOMINAL AORTIC ENDOVASCULAR STENT GRAFT INSERTION;  Surgeon: Larina Earthly, MD;  Location: [facility] OR;  Service: Vascular;  Laterality: N/A;   HEART STENT  04-2010  and  Jun 07, 2013   X 3   LEFT HEART CATH AND CORONARY ANGIOGRAPHY N/A 04/03/2019   Procedure: LEFT HEART CATH AND CORONARY ANGIOGRAPHY;  Surgeon: Yvonne Kendall, MD;  Location: [facility] INVASIVE CV LAB;  Service: Cardiovascular;  Laterality: N/A;   LEFT HEART CATHETERIZATION WITH CORONARY ANGIOGRAM N/A 06/07/2013   Procedure: LEFT HEART CATHETERIZATION WITH CORONARY ANGIOGRAM;  Surgeon: Kathleene Hazel, MD;  Location: [facility] CATH LAB;  Service: Cardiovascular;  Laterality: N/A;   LEFT HEART CATHETERIZATION WITH CORONARY ANGIOGRAM N/A 02/25/2014   Procedure: LEFT HEART CATHETERIZATION WITH CORONARY ANGIOGRAM;  Surgeon: Lennette Bihari, MD;  Location: [facility] CATH LAB;  Service: Cardiovascular;  Laterality: N/A;   LUMBAR FUSION  01/2007   DR. Noel Gerold...3-LEVEL WITH FIXATION   OOPHORECTOMY     BSO? pt.unsure   PARATHYROIDECTOMY     SPINE SURGERY     THYROIDECTOMY     TOTAL ABDOMINAL HYSTERECTOMY      Current Medications: Current Meds  Medication Sig   allopurinol (ZYLOPRIM) 300 MG tablet Take 1 tablet (300 mg total) by mouth as needed (for Gout).   aspirin 81 MG EC tablet Take 81 mg by mouth daily.    atenolol (TENORMIN) 25 MG tablet Take 1 tablet (25 mg total) by mouth daily.   atorvastatin (LIPITOR) 80 MG tablet  Take 1 tablet (80 mg total) by mouth daily.   celecoxib (CELEBREX) 200 MG capsule Take 1 capsule (200 mg total) by mouth 2 (two) times daily as needed for moderate pain.   Cholecalciferol (THERA-D 2000) 50 MCG (2000 UT) TABS 1 tab by mouth once daily (Patient taking differently: 1 tab by mouth once daily, prn)   fluconazole (DIFLUCAN) 150 MG tablet 1 tab by mouth every 3 days as needed   furosemide (LASIX) 40 MG tablet Take 1 tablet (40 mg total) by mouth daily. (Patient taking differently: Take 40 mg by mouth daily as needed for fluid.)   gabapentin (NEURONTIN) 300 MG capsule Take 1-2 tab by mouth at bedtime for pain and sleep   HYDROcodone bit-homatropine (HYCODAN) 5-1.5 MG/5ML syrup Take 5  mLs by mouth every 6 (six) hours as needed for up to 10 days.   isosorbide mononitrate (IMDUR) 30 MG 24 hr tablet Take 0.5 tablets (15 mg total) by mouth daily.   nitroGLYCERIN (NITROSTAT) 0.4 MG SL tablet Place 1 tablet (0.4 mg total) under the tongue every 5 (five) minutes as needed for chest pain.   oxyCODONE (OXYCONTIN) 10 mg 12 hr tablet Take 1 tablet (10 mg total) by mouth every 12 (twelve) hours.   pantoprazole (PROTONIX) 40 MG tablet Take 40 mg by mouth daily as needed (heartburn acid reflux).   potassium chloride (KLOR-CON) 10 MEQ tablet Take 1 tablet (10 mEq total) by mouth daily. (Patient taking differently: Take 10 mEq by mouth daily as needed.)   pregabalin (LYRICA) 75 MG capsule Take 75 mg by mouth daily as needed (pain).    SYNTHROID 150 MCG tablet TAKE 1 TABLET(150 MCG) BY MOUTH DAILY   tiZANidine (ZANAFLEX) 2 MG tablet Take 1 tablet (2 mg total) by mouth every 6 (six) hours as needed for muscle spasms.   triamcinolone (NASACORT) 55 MCG/ACT AERO nasal inhaler Place 2 sprays into the nose as needed (congestion).     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 Daphane Shepherd, May 04, 2009 @ 3:27 PM   Right handed   One story home   .   Social Determinants of Health   Financial Resource Strain: Low Risk  (04/05/2022)   Overall Financial Resource Strain (CARDIA)    Difficulty of Paying Living Expenses: Not hard at all  Food Insecurity: No Food Insecurity (04/05/2022)   Hunger Vital Sign    Worried About Running Out of Food in the Last Year: Never true    Ran Out of Food in the Last Year: Never true  Transportation Needs: No Transportation Needs (04/05/2022)   PRAPARE - Administrator, Civil Service (Medical): No    Lack of Transportation (Non-Medical): No  Physical Activity: Inactive (04/05/2022)   Exercise Vital Sign    Days of Exercise per Week: 0 days    Minutes of Exercise per Session: 0 min  Stress: No Stress Concern Present (04/05/2022)   [employer] of Occupational Health - Occupational Stress Questionnaire    Feeling of Stress : Not at all  Social Connections: Moderately Isolated (04/05/2022)   Social Connection and Isolation Panel [NHANES]  Frequency of Communication with  Friends and Family: More than three times a week    Frequency of Social Gatherings with Friends and Family: More than three times a week    Attends Religious Services: More than 4 times per year    Active Member of [employer] or Organizations: No    Attends Engineer, structural: Never    Marital Status: Divorced     Family History: The patient's family history includes Arthritis in her maternal aunt; Breast cancer in her maternal aunt; Diabetes in her brother and maternal aunt; Heart attack (age of onset: 52) in her mother; Heart attack (age of onset: 52) in her father; Heart disease in her father and mother. There is no history of Colon cancer.  ROS:   Please see the history of present illness.    All other systems reviewed and are negative.  EKGs/Labs/Other Studies Reviewed:    The following studies were reviewed today: Cardiac Studies & Procedures   CARDIAC CATHETERIZATION  CARDIAC CATHETERIZATION 04/03/2019  Narrative Conclusions: 1. Multivessel coronary artery disease, as detailed below.  There may have been slight interval progression in the mid/distal RCA disease, though otherwise the angiographic appearance of the coronary arteries is similar to the most recent study in 10/2015. 2. Normal left ventricular systolic function with mildly elevated filling pressure.  Recommendations: 1. Images reviewed with interventional team.  We agree that the distal RCA is diffusely disease and supplies relatively small/diseased PDA and PL branches; this is not an ideal target for PCI.  Other disease, the most severe of which affects the small/distal branches, has not changed significantly since 2017. 2. Start isosorbide mononitrate 30 mg daily and wean nitroglycerin infusion, as tolerated.  Reserve PCI to RCA and/or LCx for refractory symptoms. 3. Aggressive secondary prevention, including high-intensity statin therapy.  Yvonne Kendall, MD [facility]  Findings Coronary  Findings Diagnostic  Dominance: Right  Left Main Vessel is large. Vessel is angiographically normal.  Left Anterior Descending Vessel is large. Prox LAD to Mid LAD lesion is 30% stenosed. The lesion was previously treated using a stent (unknown type) over 2 years ago. Previously placed stent displays restenosis. Dist LAD lesion is 75% stenosed.  First Diagonal Branch Vessel is small in size.  Second Diagonal Branch Vessel is moderate in size. 2nd Diag lesion is 75% stenosed.  Third Diagonal Branch Vessel is small in size.  Left Circumflex Prox Cx to Mid Cx lesion is 60% stenosed. The lesion is eccentric. Mid Cx to Dist Cx lesion is 60% stenosed.  First Obtuse Marginal Branch Vessel is small in size.  Second Obtuse Marginal Branch Vessel is large in size. Previously placed 2nd Mrg stent (unknown type) is widely patent.  Right Coronary Artery Vessel is moderate in size. Previously placed Ost RCA to Mid RCA stent (unknown type) is widely patent. Pressure wire/FFR was not performed on the lesion. Mid RCA to Dist RCA lesion is 70% stenosed. Dist RCA lesion is 55% stenosed.  Right Posterior Descending Artery Vessel is small in size. There is severe disease in the vessel.  Right Posterior Atrioventricular Artery Vessel is small in size.  Intervention  No interventions have been documented.   CARDIAC CATHETERIZATION  CARDIAC CATHETERIZATION 10/29/2015  Narrative Conclusions: 1. Multivessel coronary artery disease, including 60% ostial and 70% proximal/mid RCA disease that is hemodynamically significant by FFR (0.71).  Mild in-stent restenosis of LAD stents, as well as 60% stenosis in the mid LCx adjacent to previously stented large OM are also  present. 2. Successful FFR-guided PCI of the RCA from the ostium to the midvessel with three overlapping Promus Premier stents post-dilated to 3.25 mm with 0% residual stenosis and TIMI-3 flow. 3. Patient had significant pain  chest pain beginning with FFR and continuing throughout intervention despite aggressive sedation and analgesia.  No clear angiographic or electrocardiographic etiology was identified.  Pain had returned to pre-cath level by the end of the procedure.  Recommendations: 1. Transfer to 2-Heart for aggressive medical management of chest pain, including weaning of NTG infusion as tolerated. 2. Dual antiplatelet therapy with aspirin and ticagrelor for at least 12 months, ideally longer. 3. Aggressive secondary prevention. 4. If patient continues to have chest pain, consider repeat catheterization with FFR and/or PCI to mid LCx. 5. Due to marked tortuosity of the innominate/right subclavian artery, consider alternate vascular access for further cardiac catheterizations.  Yvonne Kendall, MD [facility] Pager: [phone]  Findings Coronary Findings Diagnostic  Dominance: Right  Left Main Vessel is large.  Left Anterior Descending The lesion is irregular. The lesion was previously treatedover 2 years ago. Previously placed stent displays restenosis.  First Diagonal Branch Vessel is small in size.  Second Diagonal Branch Vessel is moderate in size.  Third Diagonal Branch Vessel is small in size.  Left Circumflex Vessel is large.  First Obtuse Marginal Branch Vessel is small in size.  Second Obtuse Marginal Branch Vessel is large in size. Previously placed Ost 2nd Mrg to 2nd Mrg bare metal stent is widely patent.  Third Obtuse Marginal Branch Vessel is moderate in size.  Right Coronary Artery Vessel is large.  Pressure wire/FFR was performed on the lesion. FFR: 0.71.  Right Posterior Descending Artery Vessel is moderate in size.  Right Posterior Atrioventricular Artery Vessel is small in size.  Intervention  Ost RCA lesion Angioplasty Lesion crossed with guidewire using a GUIDEWIRE COMET PRESSURE. Pre-stent angioplasty was performed using a BALLOON [place] MR  3.0X12. A STENT PROMUS PREM MR 3.0X12 drug eluting stent was successfully placed, and overlaps previously placed stent. Stent strut is well apposed. Post-stent angioplasty was performed using a BALLOON [place] EUPHORA [id number]. Maximum pressure: 16 atm. The pre-interventional distal flow is normal (TIMI 3).  The post-interventional distal flow is normal (TIMI 3). The intervention was successful . No complications occurred at this lesion. There is a 0% residual stenosis post intervention.  Ost RCA to Prox RCA lesion Angioplasty Lesion crossed with guidewire using a GUIDEWIRE COMET PRESSURE. Pre-stent angioplasty was performed using a BALLOON [place] MR 3.0X12. A STENT PROMUS PREM MR 3.0X16 drug eluting stent was successfully placed, and overlaps previously placed stent. Stent strut is well apposed. Post-stent angioplasty was performed using a BALLOON [place] EUPHORA [id number]. Maximum pressure: 14 atm. The pre-interventional distal flow is normal (TIMI 3).  The post-interventional distal flow is normal (TIMI 3). The intervention was successful . No complications occurred at this lesion. There is a 0% residual stenosis post intervention.  Prox RCA to Mid RCA lesion Angioplasty Lesion crossed with guidewire using a GUIDEWIRE COMET PRESSURE. Pre-stent angioplasty was performed. A STENT PROMUS PREM MR 3.0X28 drug eluting stent was successfully placed. Stent strut is well apposed. Post-stent angioplasty was performed using a BALLOON [place] EUPHORA [id number]. Maximum pressure: 14 atm. The pre-interventional distal flow is normal (TIMI 3).  The post-interventional distal flow is normal (TIMI 3). The intervention was successful . There is a 0% residual stenosis post intervention.   STRESS TESTS  MYOCARDIAL PERFUSION IMAGING 08/17/2018  Narrative  Electrically negative  for ischemia  Normal perfusion  LVEF 59%  Low risk study   ECHOCARDIOGRAM  ECHOCARDIOGRAM COMPLETE 02/03/2021  Narrative ECHOCARDIOGRAM  REPORT    Patient Name:   [name] Date of Exam: 02/03/2021 Medical Rec #:  [id number]      Height:       66.0 in Accession #:    [id number]     Weight:       194.2 lb Date of Birth:  April 28, 1945       BSA:          1.975 m Patient Age:    75 years       BP:           138/80 mmHg Patient Gender: F              HR:           59 bpm. Exam Location:  [address]  Procedure: 3D Echo, 2D Echo, Cardiac Doppler, Color Doppler and Strain Analysis  Indications:    I50.43 CHF  History:        Patient has prior history of Echocardiogram examinations, most recent 02/09/2018. CAD, Signs/Symptoms:Edema; Risk Factors:Hypertension, Diabetes, Dyslipidemia and Former Smoker. PVD. AAA. Venous insufficiency. Obesity. Anemia.  Sonographer:    Jorje Guild BS, RDCS Referring Phys: [phone] [name]  IMPRESSIONS   1. Left ventricular ejection fraction, by estimation, is 55%. The left ventricle has normal function. The left ventricle demonstrates regional wall motion abnormalities with basal to mid inferior hypokinesis. Left ventricular diastolic parameters are consistent with Grade I diastolic dysfunction (impaired relaxation). 2. Right ventricular systolic function is normal. The right ventricular size is normal. Tricuspid regurgitation signal is inadequate for assessing PA pressure. 3. The mitral valve is normal in structure. No evidence of mitral valve regurgitation. No evidence of mitral stenosis. 4. The aortic valve is tricuspid. Aortic valve regurgitation is not visualized. Aortic valve sclerosis/calcification is present, without any evidence of aortic stenosis. 5. The inferior vena cava is normal in size with greater than 50% respiratory variability, suggesting right atrial pressure of 3 mmHg.  Comparison(s): 02/09/18 EF 50-55%.  FINDINGS Left Ventricle: Left ventricular ejection fraction, by estimation, is 55%. The left ventricle has normal function. The left ventricle demonstrates regional  wall motion abnormalities. The left ventricular internal cavity size was normal in size. There is no left ventricular hypertrophy. Left ventricular diastolic parameters are consistent with Grade I diastolic dysfunction (impaired relaxation).  Right Ventricle: The right ventricular size is normal. No increase in right ventricular wall thickness. Right ventricular systolic function is normal. Tricuspid regurgitation signal is inadequate for assessing PA pressure.  Left Atrium: Left atrial size was normal in size.  Right Atrium: Right atrial size was normal in size.  Pericardium: There is no evidence of pericardial effusion.  Mitral Valve: The mitral valve is normal in structure. Mild mitral annular calcification. No evidence of mitral valve regurgitation. No evidence of mitral valve stenosis.  Tricuspid Valve: The tricuspid valve is normal in structure. Tricuspid valve regurgitation is not demonstrated.  Aortic Valve: The aortic valve is tricuspid. Aortic valve regurgitation is not visualized. Aortic valve sclerosis/calcification is present, without any evidence of aortic stenosis.  Pulmonic Valve: The pulmonic valve was normal in structure. Pulmonic valve regurgitation is trivial.  Aorta: The aortic root is normal in size and structure.  Venous: The inferior vena cava is normal in size with greater than 50% respiratory variability, suggesting right atrial pressure of 3 mmHg.  IAS/Shunts: No atrial  level shunt detected by color flow Doppler.   LEFT VENTRICLE PLAX 2D LVIDd:         5.00 cm   Diastology LVIDs:         3.30 cm   LV e' medial:    7.50 cm/s LV PW:         0.80 cm   LV E/e' medial:  8.1 LV IVS:        0.90 cm   LV e' lateral:   13.10 cm/s LVOT diam:     2.20 cm   LV E/e' lateral: 4.6 LV SV:         77 LV SV Index:   39        2D Longitudinal Strain LVOT Area:     3.80 cm  2D Strain GLS (A2C):   -17.7 % 2D Strain GLS (A3C):   -14.9 % 2D Strain GLS (A4C):   -18.6 % 2D  Strain GLS Avg:     -17.1 %  3D Volume EF: 3D EF:        55 % LV EDV:       145 ml LV ESV:       65 ml LV SV:        81 ml  RIGHT VENTRICLE            IVC RV Basal diam:  3.70 cm    IVC diam: 1.30 cm RV S prime:     9.45 cm/s TAPSE (M-mode): 1.8 cm  LEFT ATRIUM             Index        RIGHT ATRIUM           Index LA diam:        3.10 cm 1.57 cm/m   RA Pressure: 3.00 mmHg LA Vol (A2C):   34.5 ml 17.47 ml/m  RA Area:     11.80 cm LA Vol (A4C):   24.3 ml 12.30 ml/m  RA Volume:   29.60 ml  14.99 ml/m LA Biplane Vol: 29.6 ml 14.99 ml/m AORTIC VALVE LVOT Vmax:   106.00 cm/s LVOT Vmean:  61.600 cm/s LVOT VTI:    0.203 m  AORTA Ao Root diam: 3.20 cm Ao Asc diam:  3.10 cm  MITRAL VALVE               TRICUSPID VALVE Estimated RAP:  3.00 mmHg MV Decel Time: 306 msec MV E velocity: 60.70 cm/s  SHUNTS MV A velocity: 92.30 cm/s  Systemic VTI:  0.20 m MV E/A ratio:  0.66        Systemic Diam: 2.20 cm  Dalton McleanMD Electronically signed by Wilfred Lacy Signature Date/Time: 02/03/2021/3:57:22 PM    Final              EKG:  EKG is ordered today.  The ekg ordered today demonstrates NSR 75 bpm, RBBB, LAFB  Recent Labs: 02/10/2022: ALT 7; BUN 5; Creatinine, Ser 0.59; Hemoglobin 13.8; Platelets 274.0; Potassium 3.6; Sodium 139; TSH 0.97  Recent Lipid Panel    Component Value Date/Time   CHOL 162 02/10/2022 1549   CHOL 143 10/30/2020 1211   TRIG 88.0 02/10/2022 1549   TRIG 102 01/02/2006 0825   HDL 35.40 (L) 02/10/2022 1549   HDL 25 (L) 10/30/2020 1211   CHOLHDL 5 02/10/2022 1549   VLDL 17.6 02/10/2022 1549   LDLCALC 109 (H) 02/10/2022 1549   LDLCALC 94 10/30/2020 1211   LDLDIRECT 131.0 03/27/2015 1137  Risk Assessment/Calculations:                Physical Exam:    VS:  BP (!) 100/56   Pulse 79   Ht 5\' 6"  (1.676 m)   Wt 175 lb (79.4 kg)   SpO2 95%   BMI 28.25 kg/m     Wt Readings from Last 3 Encounters:  06/10/22 175 lb (79.4 kg)  06/09/22  174 lb (78.9 kg)  06/03/22 173 lb (78.5 kg)     GEN:  Well nourished, well developed in no acute distress HEENT: Normal NECK: No JVD; No carotid bruits LYMPHATICS: No lymphadenopathy CARDIAC: RRR, no murmurs, rubs, gallops RESPIRATORY:  Clear to auscultation without rales, wheezing or rhonchi  ABDOMEN: Soft, non-tender, non-distended MUSCULOSKELETAL:  No edema; No deformity  SKIN: Warm and dry NEUROLOGIC:  Alert and oriented x 3 PSYCHIATRIC:  Normal affect   ASSESSMENT:    1. Chronic diastolic heart failure (HCC)   2. Coronary artery disease involving native coronary artery of native heart with angina pectoris (HCC)   3. Abdominal aortic aneurysm (AAA) without rupture, unspecified part (HCC)   4. Mixed hyperlipidemia   5. Essential hypertension   6. Precordial pain    PLAN:    In order of problems listed above:  Clinically stable with no evidence of volume overload on exam.  Treated with a beta-blocker, as needed furosemide, and a nitrate. Patient having chest pain with typical and atypical features.  No EKG changes with sinus rhythm and bifascicular block compared with her old tracing.  Continue aspirin for antiplatelet therapy.  Recommend Lexiscan stress Myoview to evaluate for ischemia.  Last study reviewed as above from 2020. Patient's status post EVAR, followed by vascular surgery LDL cholesterol above goal.  She has had a lot of issues with statins and has been on and off of treatment.  Recommend referral to lipid clinic for consideration of inclisiran.  She does not think she be able to administer a PCSK9 inhibitor. Blood pressure well-controlled on atenolol and low-dose isosorbide.      Shared Decision Making/Informed Consent The risks [chest pain, shortness of breath, cardiac arrhythmias, dizziness, blood pressure fluctuations, myocardial infarction, stroke/transient ischemic attack, nausea, vomiting, allergic reaction, radiation exposure, metallic taste sensation and  life-threatening complications (estimated to be 1 in 10,000)], benefits (risk stratification, diagnosing coronary artery disease, treatment guidance) and alternatives of a nuclear stress test were discussed in detail with [name] and she agrees to proceed.    Medication Adjustments/Labs and Tests Ordered: Current medicines are reviewed at length with the patient today.  Concerns regarding medicines are outlined above.  Orders Placed This Encounter  Procedures   AMB Referral to [facility] Pharm[facility]   MYOCARDIAL PERFUSION IMAGING   EKG 12-Lead   No orders of the defined types were placed in this encounter.   Patient Instructions  Medication Instructions:  Your physician recommends that you continue on your current medications as directed. Please refer to the Current Medication list given to you today.  *If you need a refill on your cardiac medications before your next appointment, please call your pharmacy*  Lab Work: If you have labs (blood work) drawn today and your tests are completely normal, you will receive your results only by: MyChart Message (if you have MyChart) OR A paper copy in the mail If you have any lab test that is abnormal or we need to change your treatment, we will call you to review the results.  Testing/Procedures: Your physician has  requested that you have a lexiscan myoview. For further information please visit [email or web address] Please follow instruction sheet, as given.  Follow-Up: At [facility], you and your health needs are our priority.  As part of our continuing mission to provide you with exceptional heart care, we have created designated Provider Care Teams.  These Care Teams include your primary Cardiologist (physician) and Advanced Practice Providers (APPs -  Physician Assistants and Nurse Practitioners) who all work together to provide you with the care you need, when you need it.  We recommend signing up for the patient portal called  "MyChart".  Sign up information is provided on this After Visit Summary.  MyChart is used to connect with patients for Virtual Visits (Telemedicine).  Patients are able to view lab/test results, encounter notes, upcoming appointments, etc.  Non-urgent messages can be sent to your provider as well.   To learn more about what you can do with MyChart, go to [email or web address].    Your next appointment:   6 month(s)  Provider:   Jari Favre, PA-C, Ronie Spies, PA-C, Robin Searing, NP, Jacolyn Reedy, PA-C, Eligha Bridegroom, NP, or Tereso Newcomer, PA-C     Then, Tonny Bollman, MD will plan to see you again in 1 year(s).    Other Instructions You have been referred to Lipid Clinic.    Signed, Tonny Bollman, MD  06/10/2022 5:17 PM    [facility]

## 2022-06-10 NOTE — Patient Instructions (Signed)
Medication Instructions:  Your physician recommends that you continue on your current medications as directed. Please refer to the Current Medication list given to you today.  *If you need a refill on your cardiac medications before your next appointment, please call your pharmacy*  Lab Work: If you have labs (blood work) drawn today and your tests are completely normal, you will receive your results only by: MyChart Message (if you have MyChart) OR A paper copy in the mail If you have any lab test that is abnormal or we need to change your treatment, we will call you to review the results.  Testing/Procedures: Your physician has requested that you have a lexiscan myoview. For further information please visit [email or web address] Please follow instruction sheet, as given.  Follow-Up: At [facility], you and your health needs are our priority.  As part of our continuing mission to provide you with exceptional heart care, we have created designated Provider Care Teams.  These Care Teams include your primary Cardiologist (physician) and Advanced Practice Providers (APPs -  Physician Assistants and Nurse Practitioners) who all work together to provide you with the care you need, when you need it.  We recommend signing up for the patient portal called "MyChart".  Sign up information is provided on this After Visit Summary.  MyChart is used to connect with patients for Virtual Visits (Telemedicine).  Patients are able to view lab/test results, encounter notes, upcoming appointments, etc.  Non-urgent messages can be sent to your provider as well.   To learn more about what you can do with MyChart, go to [email or web address].    Your next appointment:   6 month(s)  Provider:   Jari Favre, PA-C, Ronie Spies, PA-C, Robin Searing, NP, Jacolyn Reedy, PA-C, Eligha Bridegroom, NP, or Tereso Newcomer, PA-C     Then, Tonny Bollman, MD will plan to see you again in 1 year(s).    Other  Instructions You have been referred to Lipid Clinic.

## 2022-06-11 ENCOUNTER — Encounter: Payer: Self-pay | Admitting: Internal Medicine

## 2022-06-11 NOTE — Assessment & Plan Note (Signed)
BP Readings from Last 3 Encounters:  06/10/22 (!) 100/56  06/09/22 (!) 140/80  06/03/22 128/78   Low normal today, asympt,, pt to continue medical treatment tenormin 25 mg qd

## 2022-06-11 NOTE — Assessment & Plan Note (Signed)
Due for B12 IM monthly - for shot today

## 2022-06-11 NOTE — Addendum Note (Signed)
Addended by: Corwin Levins on: 06/11/2022 06:44 PM   Modules accepted: Level of Service

## 2022-06-11 NOTE — Assessment & Plan Note (Signed)
With mild to mod flare, for toradol 30 mg IM

## 2022-06-11 NOTE — Assessment & Plan Note (Signed)
Last vitamin D Lab Results  Component Value Date   VD25OH 10.59 (L) 02/10/2022  Low, to start oral replacement  

## 2022-06-11 NOTE — Assessment & Plan Note (Signed)
Lab Results  Component Value Date   LDLCALC 109 (H) 02/10/2022   Uncontrolled, does not want to take zetia 10 qd, ok for repatha 140 Im q 2 wks

## 2022-06-16 ENCOUNTER — Encounter: Payer: Medicare PPO | Admitting: *Deleted

## 2022-06-16 DIAGNOSIS — Z006 Encounter for examination for normal comparison and control in clinical research program: Secondary | ICD-10-CM

## 2022-06-16 NOTE — Research (Addendum)
African American Heart Study Informed Consent   Subject Name: [name]  Subject met inclusion and exclusion criteria.  The informed consent form, study requirements and expectations were reviewed with the subject and questions and concerns were addressed prior to the signing of the consent form.  The subject verbalized understanding of the trial requirements.  The subject agreed to participate in the African American Heart Study  trial and signed the informed consent at 14:05  on 06-16-2022.  The informed consent was obtained prior to performance of any protocol-specific procedures for the subject.  A copy of the signed informed consent was given to the subject and a copy was placed in the subject's medical record.   [place] Kiesha Ensey, Research Coordinator    Patient completed [employer] Questionnaire Medical History Paper form Completed    VITALS 14:25  BLOOD PRESSURE: 100/56  HEART RATE: 75 RESP 18 SPO2: 98 TEMP: 97.4  WEIGHT: 175 HEIGHT: 167 CM  BLOOD DRAWN AT 14:59

## 2022-06-20 ENCOUNTER — Telehealth (HOSPITAL_COMMUNITY): Payer: Self-pay

## 2022-06-20 NOTE — Research (Signed)
Are there any labs that are clinically significant?  Yes []  OR No[]     ACCESSION NO. [id number]                                             Page 1 of 1                                                        INVESTIGATOR: ([id number])                          PROTOCOL   [id number]                     Thomasene Ripple, M.D.                              INVESTIGATOR NO.: [id number]                     c/o [place] [facility]                         SUBJECT NUMBER: [id number]                     [place]                 SUBJECT INITIALS NOT COLLECTED:                     [address]                          VISIT: D0                     [place], [place] 0                   SPONSOR REPORT TO:                 COLLECTION TIME:14:59 DATE:16-Jun-2022                     Cruzita Lederer                 DATE RECEIVED IN LABORATORY: 17-Jun-2022                     c/o Sponsor [employer] access         DATE REPORTED BY LABORATORY: 17-Jun-2022                     [place]                          SEX: F  AGE: 84O                     [address]                   [place], [place] [phone]  Ref. Ranges               Clinical    Comments                                                                          Significance                                                                            Yes*  No                    HEMATOLOGY&DIFFERENTIAL PANEL                      HGB            13.9         11.5-15.8 g/dL                      HCT            45           34-48 %                      RBC            5.1          3.9-5.5 x106/uL                      MCH            27           26-34 pg                      MCHC           31           31-38 g/dL                      RDW            13.7         12.0-15.0 %                      RBC Morph      No Review Required                         MCV            89           80-100 fL                      WBC            6.52         3.80-10.70 x103/uL  Neutrophil     4.63         1.96-7.23 x103/uL                      Lymphocyte     1.34         0.80-3.00 x103/uL                      Monocytes      0.42         0.12-0.92 x103/uL                      Eosinophil     0.12         0.00-0.57 x103/uL                      Basophils      0.02         0.00-0.20 x103/uL                      Neutrophil     71.0         40.5-75.0 %                      Lymphocyte     20.6         15.4-48.5%                      Monocytes      6.4          2.6-10.1 %                      Eosinophil     1.8          0.0-6.8 %                      Basophils      0.3          0.0-2.0 %                      Platelets      327          130-394 x103/uL    ANC                      ANC            4.63         1.96-7.23 x103/uL   [id number] LPA COLLECTION D/T                      Untimed D      Awaiting specimen arrival                        Untimed T      Awaiting specimen arrival                      SM/PLASMA PROTEO & BMS D/T                      Untimed D      Awaiting specimen arrival                        Untimed T      Awaiting  specimen arrival                      SM/WB DNA COLLECTION D/T                      Untimed D      Awaiting specimen arrival                        Untimed T      Awaiting specimen arrival                      SM/WB PAXGENE RNA COLLECT D/T - See Note #1                    SUBJECT FASTED AT LEAST 9 HRS?                      Pt fast 9h     Yes            Note #1 - No specimen received RETICULOCYTES                      Retic %        0.9          0.6-2.5 %                      Retic Abs      0.045        0.030-0.120 x106/uL

## 2022-06-20 NOTE — Telephone Encounter (Signed)
Spoke with the patient, detailed instructions given. She stated she would be here for his test. Asked to call back with any questions. S.Analei Whinery EMTP/CCT

## 2022-06-21 ENCOUNTER — Ambulatory Visit (HOSPITAL_COMMUNITY): Payer: Medicare HMO | Attending: Cardiovascular Disease

## 2022-06-21 DIAGNOSIS — R072 Precordial pain: Secondary | ICD-10-CM

## 2022-06-21 LAB — MYOCARDIAL PERFUSION IMAGING
LV dias vol: 78 mL (ref 46–106)
LV sys vol: 29 mL
Nuc Stress EF: 62 %
Peak HR: 86 {beats}/min
Rest HR: 55 {beats}/min
Rest Nuclear Isotope Dose: 10.7 mCi
SDS: 4
SRS: 0
SSS: 4
ST Depression (mm): 0 mm
Stress Nuclear Isotope Dose: 30.4 mCi
TID: 1.03

## 2022-06-21 MED ORDER — TECHNETIUM TC 99M TETROFOSMIN IV KIT
10.7000 | PACK | Freq: Once | INTRAVENOUS | Status: AC | PRN
  Administered 2022-06-21: 10.7 via INTRAVENOUS

## 2022-06-21 MED ORDER — TECHNETIUM TC 99M TETROFOSMIN IV KIT
30.4000 | PACK | Freq: Once | INTRAVENOUS | Status: AC | PRN
  Administered 2022-06-21: 30.4 via INTRAVENOUS

## 2022-06-21 MED ORDER — REGADENOSON 0.4 MG/5ML IV SOLN
0.4000 mg | Freq: Once | INTRAVENOUS | Status: AC
  Administered 2022-06-21: 0.4 mg via INTRAVENOUS

## 2022-06-21 NOTE — Progress Notes (Signed)
[name] is well known to me.  She has known CAD, triple AA repair, DM who I have known for many years.  She is here for possible enrollment in the AA Heart registry study.  Overall she is stable.  She meets the key enrollment criteria, and has none of the exclusion criteria.  Alert, oriented in NAD Affect normal No JVD Lungs clear Cor regular No edema

## 2022-06-29 NOTE — Telephone Encounter (Signed)
VOB initiated for BILAT Zilretta for BILAT knee OA.

## 2022-06-30 DIAGNOSIS — M4325 Fusion of spine, thoracolumbar region: Secondary | ICD-10-CM | POA: Diagnosis not present

## 2022-06-30 DIAGNOSIS — M47812 Spondylosis without myelopathy or radiculopathy, cervical region: Secondary | ICD-10-CM | POA: Diagnosis not present

## 2022-07-04 NOTE — Telephone Encounter (Signed)
Prior auth required for ZILRETTA for BILAT knee OA     

## 2022-07-05 NOTE — Telephone Encounter (Signed)
Prior Authorization initiated for [facility] via [email or web address] KEY: [id number]

## 2022-07-06 NOTE — Telephone Encounter (Signed)
Prior auth for [place] APPROVED  PA# [id number] Valid: 05/04/22-11/03/22

## 2022-07-06 NOTE — Telephone Encounter (Signed)
Zilretta for BILAT knee OA  Primary Insurance: [employer] Medicare Co-Pay: $40 Co-insurance: 0% Deductible: does not apply  Prior Auth APPROVED PA# [id number] Valid: 05/04/22-11/03/22  Last Zilretta injection(s) 05/05/22 Can repeat on or after 07/29/22

## 2022-07-07 NOTE — Telephone Encounter (Signed)
Not due until 7/12

## 2022-07-08 ENCOUNTER — Ambulatory Visit: Payer: Medicare HMO

## 2022-07-28 ENCOUNTER — Telehealth: Payer: Self-pay | Admitting: Internal Medicine

## 2022-07-28 MED ORDER — ATENOLOL 25 MG PO TABS: 25.0000 mg | ORAL_TABLET | Freq: Every day | ORAL | 1 refills | Status: DC

## 2022-07-28 NOTE — Telephone Encounter (Signed)
Prescription Request  07/28/2022  LOV: 06/09/2022  What is the name of the medication or equipment? atenolol (TENORMIN) 25 MG tablet   Have you contacted your pharmacy to request a refill? Yes   Which pharmacy would you like this sent to?  [id number] - [place], [place] - [address] AT [facility] OF [address] [place] [address] Phone: [phone] Fax: [phone]    Patient notified that their request is being sent to the clinical staff for review and that they should receive a response within 2 business days.   Please advise at Mobile [phone] (mobile)

## 2022-07-28 NOTE — Telephone Encounter (Signed)
Sent med to P[facility].Marland KitchenRaechel [name]

## 2022-08-01 ENCOUNTER — Ambulatory Visit: Payer: Medicare HMO | Attending: Cardiovascular Disease | Admitting: Pharmacist

## 2022-08-01 DIAGNOSIS — I25119 Atherosclerotic heart disease of native coronary artery with unspecified angina pectoris: Secondary | ICD-10-CM | POA: Diagnosis not present

## 2022-08-01 MED ORDER — ATORVASTATIN CALCIUM 40 MG PO TABS: 40.0000 mg | ORAL_TABLET | Freq: Every day | ORAL | 3 refills | Status: DC

## 2022-08-01 NOTE — Progress Notes (Signed)
Patient ID: JMYA ULIANO                 DOB: [birth year]                    MRN: [id number]     HPI: [name] is a 77 y.o. female patient referred to lipid clinic by Dr Excell Seltzer. PMH is significant for CAD s/p MI in 1998 treated with stent to RCA, stent to LCx in 2012, stent to LAD in 2015, stent x 3 to RCA in 2017, cath 03/2019 with diffuse CAD, HFpEF, T2DM, HTN, HLD, AAA s/p EVAR 01/2016, and family history of premature CAD (mother with MI at age 25, father with MI at age 42).  Pt has atorvastatin 80mg  on her med list but does not look like this has been filled at all recently. PCP prescribed Zetia in January 2024. At May 2024 follow up, reported she "didn't want to take it." Willing to try Repatha however saw cardiology the next day and reported she wouldn't be able to give herself injections.  Pt presents today for follow up. Confirms she has not been taking atorvastatin in a while. Does not recall why she stopped the med but she reports not taking it when labs were checked in January. Does not recall side effects. Doesn't recall taking Zetia. Discussed that Crestor is listed on her allergies as making her feel poorly, she does not have additional details to provide. Thinks she's been off cholesterol medication for years.  Current Medications: none Intolerances: atorvastatin 10, 20, 40, and 80mg  daily, rosuvastatin 10, 20, and 40mg  daily ("felt poor"), ezetimibe 10mg  daily Risk Factors: premature and very progressive CAD (stents in 4 different years), DM, HTN, HF, age, FHx premature CAD LDL goal: 55mg /dL  Diet: Vegetables, chicken. Drinks regular Coke- 1 a day  Exercise: Walks  Family History: Arthritis in her maternal aunt; Breast cancer in her maternal aunt; Diabetes in her brother and maternal aunt; Heart attack (age of onset: 70) in her mother; Heart attack (age of onset: 35) in her father; Heart disease in her father and mother   Social History: Former tobacco use, quit in  1988.  Labs: 02/10/22: TC 162, TG 88, HDL 35.4, LDL 109 - no LLT  Past Medical History:  Diagnosis Date   AAA (abdominal aortic aneurysm) (HCC)    a. s/p stent graft repair 01/2016.   Acute ischemic colitis (HCC) 07/29/2010   Diagnosed June, 2012 characterized by acute lower GI bleeding, spontaneously resolved.    Acute respiratory infection 06/09/2014   Acute sinus infection 05/14/2015   Allergic rhinitis, cause unspecified    Angioedema of lips 06/03/2014   Anxiety state, unspecified    Arthritis of left hip 04/16/2015   Bilateral hearing loss 07/19/2017   Chronic LBP    Coronary artery disease    a. inferior MI 1998 s/p PCI of RCA. b. stenting of Cx 04/2010. c. DES to prox LAD 05/2013. d. DES x 3 in 10/2015. // Myoview 07/2018:  EF 59, normal perfusion, low risk    Coronary artery disease involving native heart without angina pectoris 07/31/2006   Qualifier: Diagnosis of  By: Debby Bud MD, Rosalyn Gess  ANGIOGRAPHIC DATA:  1. Ventriculography done in the RAO projection reveals a small wall  motion abnormality in the mid inferior wall. Ejection fraction  would be estimated at around 50%.  2. The right coronary artery has some progressive disease of about 60-  80% in the proximal  mid segment. The distal vessel appears to be  50-70% narrowing althou   Degeneration of lumbar or lumbosacral intervertebral disc    Depressive disorder, not elsewhere classified    Diabetes (HCC) 11/03/2006   Qualifier: Diagnosis of  By: Jonny Ruiz MD, Len Blalock    Diabetes mellitus    TYPE II   Frequent urination 05/08/2014   Gout 08/22/2013   History of endovascular stent graft for abdominal aortic aneurysm (AAA) 01/22/2016   Hyperlipidemia    Hypertension    Hypothyroidism    Iron deficiency anemia 06/13/2017   Lower GI bleed 06/2010   Diverticular bleed   Lumbar radiculopathy 05/15/2015   MENOPAUSAL DISORDER 10/29/2007   Qualifier: Diagnosis of  By: Jonny Ruiz MD, Len Blalock    Myalgia 09/25/2013   Noncompliance with medications 02/26/2014    Obesity, unspecified    OTITIS MEDIA, SEROUS, CHRONIC 04/23/2007   Qualifier: Diagnosis of  By: Debby Bud MD, Rosalyn Gess    Thoracic disc disease 11/19/2018   URTICARIA 09/23/2009   Qualifier: Diagnosis of  By: Felicity Coyer MD, Vikki Ports A    Venous insufficiency 07/19/2017    Current Outpatient Medications on File Prior to Visit  Medication Sig Dispense Refill   allopurinol (ZYLOPRIM) 300 MG tablet Take 1 tablet (300 mg total) by mouth as needed (for Gout). 90 tablet 3   aspirin 81 MG EC tablet Take 81 mg by mouth daily.      atenolol (TENORMIN) 25 MG tablet Take 1 tablet (25 mg total) by mouth daily. Annual appt due in Jan "25" must see provider for future refills 90 tablet 1   atorvastatin (LIPITOR) 80 MG tablet Take 1 tablet (80 mg total) by mouth daily.  1   celecoxib (CELEBREX) 200 MG capsule Take 1 capsule (200 mg total) by mouth 2 (two) times daily as needed for moderate pain. 180 capsule 1   Cholecalciferol (THERA-D 2000) 50 MCG (2000 UT) TABS 1 tab by mouth once daily (Patient taking differently: 1 tab by mouth once daily, prn) 90 tablet 99   fluconazole (DIFLUCAN) 150 MG tablet 1 tab by mouth every 3 days as needed 2 tablet 1   furosemide (LASIX) 40 MG tablet Take 1 tablet (40 mg total) by mouth daily. (Patient taking differently: Take 40 mg by mouth daily as needed for fluid.) 90 tablet 3   gabapentin (NEURONTIN) 300 MG capsule Take 1-2 tab by mouth at bedtime for pain and sleep 180 capsule 1   isosorbide mononitrate (IMDUR) 30 MG 24 hr tablet Take 0.5 tablets (15 mg total) by mouth daily. 45 tablet 3   nitroGLYCERIN (NITROSTAT) 0.4 MG SL tablet Place 1 tablet (0.4 mg total) under the tongue every 5 (five) minutes as needed for chest pain. 25 tablet 3   oxyCODONE (OXYCONTIN) 10 mg 12 hr tablet Take 1 tablet (10 mg total) by mouth every 12 (twelve) hours. 14 tablet 0   pantoprazole (PROTONIX) 40 MG tablet Take 40 mg by mouth daily as needed (heartburn acid reflux).     potassium chloride  (KLOR-CON) 10 MEQ tablet Take 1 tablet (10 mEq total) by mouth daily. (Patient taking differently: Take 10 mEq by mouth daily as needed.) 90 tablet 3   pregabalin (LYRICA) 75 MG capsule Take 75 mg by mouth daily as needed (pain).      SYNTHROID 150 MCG tablet TAKE 1 TABLET(150 MCG) BY MOUTH DAILY 90 tablet 3   tiZANidine (ZANAFLEX) 2 MG tablet Take 1 tablet (2 mg total) by mouth every 6 (  six) hours as needed for muscle spasms. 40 tablet 1   triamcinolone (NASACORT) 55 MCG/ACT AERO nasal inhaler Place 2 sprays into the nose as needed (congestion). 1 each 12   No current facility-administered medications on file prior to visit.    Allergies  Allergen Reactions   Crestor [Rosuvastatin Calcium] Other (See Comments)    Feeling poor   Prilosec [Omeprazole] Other (See Comments)    Chest pain   Miconazole Nitrate Hives    REACTION: hives   Augmentin [Amoxicillin-Pot Clavulanate] Hives, Itching and Rash    Has patient had a PCN reaction causing immediate rash, facial/tongue/throat swelling, SOB or lightheadedness with hypotension:unsure Has patient had a PCN reaction causing severe rash involving mucus membranes or skin necrosis:unsure Has patient had a PCN reaction that required hospitalization:No Has patient had a PCN reaction occurring within the last 10 years:NO If all of the above answers are "NO", then may proceed with Cephalosporin use. Has patient had a PCN reaction causing immediate rash, facial/tongue/throat swelling, SOB or lightheadedness with hypotension:unsure Has patient had a PCN reaction causing severe rash involving mucus membranes or skin necrosis:unsure Has patient had a PCN reaction that required hospitalization:No Has patient had a PCN reaction occurring within the last 10 years:NO If all of the above answers are "NO", then may proceed with Cephalosporin use.    Doxycycline Other (See Comments)    REACTION: gi upset    Assessment/Plan:  1. Hyperlipidemia - LDL 109 in  January, above goal < 55 given progressive/premature CAD and DM. Non-specific side effects to Crestor of making her feel poorly. Doesn't recall side effects to Lipitor which has been on her med list but she hasn't been taking. Doesn't recall issue with Zetia either.  Reviewed options for lowering LDL cholesterol, including statins, ezetimibe, and briefly injectable therapy which is more expensive (Repatha $40/1 month or $80/3 months, Leqvio 25% copay but looks like her plan caps this at $100 copay per dose). Pt wishes to retry Lipitor. Will send in rx at slightly lower dose of 40mg  daily to increase chance of tolerability while still providing ~50% LDL lowering needed to reach her goal. Will recheck labs in 2 months.  Pt has Verification of Chronic Condition form that her [facility] form needs filled out. I have completed this and faxed to [facility] at [phone], confirmation received. Form scanned into Media tab.  Olita Takeshita E. Armin Yerger, PharmD, BCACP, CPP [facility] [address] [place], [place] [address] Phone: [phone]; Fax: [phone] 08/01/2022 2:03 PM

## 2022-08-01 NOTE — Patient Instructions (Addendum)
Your LDL cholesterol is 109 and your goal is < 55  Start atorvastatin (Lipitor) 40mg  - 1 tablet once daily  Recheck fasting labs on Wednesday, September 18th any time after 7:30am  Call Aundra Millet, PharmD with any concerns #[phone]

## 2022-08-04 ENCOUNTER — Ambulatory Visit (INDEPENDENT_AMBULATORY_CARE_PROVIDER_SITE_OTHER): Payer: Medicare HMO | Admitting: Internal Medicine

## 2022-08-04 ENCOUNTER — Encounter: Payer: Self-pay | Admitting: Internal Medicine

## 2022-08-04 ENCOUNTER — Other Ambulatory Visit: Payer: Self-pay

## 2022-08-04 ENCOUNTER — Encounter: Payer: Self-pay | Admitting: Family Medicine

## 2022-08-04 ENCOUNTER — Ambulatory Visit (INDEPENDENT_AMBULATORY_CARE_PROVIDER_SITE_OTHER): Payer: Medicare HMO | Admitting: Family Medicine

## 2022-08-04 VITALS — BP 122/82 | HR 80 | Ht 66.0 in | Wt 178.0 lb

## 2022-08-04 VITALS — BP 122/82 | HR 80 | Temp 98.0°F | Ht 66.0 in | Wt 178.0 lb

## 2022-08-04 DIAGNOSIS — G8929 Other chronic pain: Secondary | ICD-10-CM

## 2022-08-04 DIAGNOSIS — E1165 Type 2 diabetes mellitus with hyperglycemia: Secondary | ICD-10-CM

## 2022-08-04 DIAGNOSIS — M25561 Pain in right knee: Secondary | ICD-10-CM | POA: Diagnosis not present

## 2022-08-04 DIAGNOSIS — E559 Vitamin D deficiency, unspecified: Secondary | ICD-10-CM

## 2022-08-04 DIAGNOSIS — M19032 Primary osteoarthritis, left wrist: Secondary | ICD-10-CM | POA: Diagnosis not present

## 2022-08-04 DIAGNOSIS — M1990 Unspecified osteoarthritis, unspecified site: Secondary | ICD-10-CM

## 2022-08-04 DIAGNOSIS — M25562 Pain in left knee: Secondary | ICD-10-CM

## 2022-08-04 DIAGNOSIS — R6889 Other general symptoms and signs: Secondary | ICD-10-CM | POA: Diagnosis not present

## 2022-08-04 DIAGNOSIS — J069 Acute upper respiratory infection, unspecified: Secondary | ICD-10-CM

## 2022-08-04 DIAGNOSIS — M170 Bilateral primary osteoarthritis of knee: Secondary | ICD-10-CM | POA: Diagnosis not present

## 2022-08-04 LAB — LIPID PANEL
Cholesterol: 160 mg/dL (ref 0–200)
HDL: 33.7 mg/dL — ABNORMAL LOW (ref >39.00)
LDL Cholesterol: 104 mg/dL — ABNORMAL HIGH (ref 0–99)
NonHDL: 126.02
Total CHOL/HDL Ratio: 5
Triglycerides: 108 mg/dL (ref 0.0–149.0)
VLDL: 21.6 mg/dL (ref 0.0–40.0)

## 2022-08-04 LAB — HEPATIC FUNCTION PANEL
ALT: 7 U/L (ref 0–35)
AST: 15 U/L (ref 0–37)
Albumin: 3.8 g/dL (ref 3.5–5.2)
Alkaline Phosphatase: 99 U/L (ref 39–117)
Bilirubin, Direct: 0.1 mg/dL (ref 0.0–0.3)
Total Bilirubin: 0.5 mg/dL (ref 0.2–1.2)
Total Protein: 8 g/dL (ref 6.0–8.3)

## 2022-08-04 LAB — POC COVID19 BINAXNOW: SARS Coronavirus 2 Ag: NEGATIVE

## 2022-08-04 LAB — BASIC METABOLIC PANEL
BUN: 6 mg/dL (ref 6–23)
CO2: 33 mEq/L — ABNORMAL HIGH (ref 19–32)
Calcium: 9.5 mg/dL (ref 8.4–10.5)
Chloride: 102 mEq/L (ref 96–112)
Creatinine, Ser: 0.67 mg/dL (ref 0.40–1.20)
GFR: 84.58 mL/min (ref >60.00)
Glucose, Bld: 84 mg/dL (ref 70–99)
Potassium: 4.3 mEq/L (ref 3.5–5.1)
Sodium: 140 mEq/L (ref 135–145)

## 2022-08-04 LAB — VITAMIN D 25 HYDROXY (VIT D DEFICIENCY, FRACTURES): VITD: 7.68 ng/mL — ABNORMAL LOW (ref 30.00–100.00)

## 2022-08-04 LAB — HEMOGLOBIN A1C: Hgb A1c MFr Bld: 5.3 % (ref 4.6–6.5)

## 2022-08-04 MED ORDER — AZITHROMYCIN 250 MG PO TABS: ORAL_TABLET | ORAL | 1 refills | Status: AC

## 2022-08-04 MED ORDER — TRIAMCINOLONE ACETONIDE 32 MG IX SRER
32.0000 mg | Freq: Once | INTRA_ARTICULAR | Status: AC
Start: 2022-08-04 — End: 2022-08-04
  Administered 2022-08-04: 32 mg via INTRA_ARTICULAR

## 2022-08-04 MED ORDER — KETOROLAC TROMETHAMINE 30 MG/ML IJ SOLN
30.0000 mg | Freq: Once | INTRAMUSCULAR | Status: AC
Start: 2022-08-04 — End: 2022-08-04
  Administered 2022-08-04: 30 mg via INTRAMUSCULAR

## 2022-08-04 NOTE — Telephone Encounter (Signed)
Pt received BILAT Zilretta inj 08/04/22. Can consider repeat on or after 10/28/22

## 2022-08-04 NOTE — Progress Notes (Signed)
I, Stevenson Clinch, CMA acting as a scribe for Clementeen Graham, MD.  [name] is a 77 y.o. female who presents to [facility] at [facility] today for cont'd bilat knee pain. Pt was last seen by Dr. Denyse Amass on 05/05/22 and was given bilat Zilretta injections. Today, pt reports worsening knee pain over the past week. Notes swelling in left knee. Denies injury or fall since last visit.   Dx imaging: 11/21/19 R & L knee XR    Pertinent review of systems: Positive for flulike symptoms.  Patient was seen at PCP office just prior to today's visit.  Relevant historical information: Calcified loose bodies both knees. PAD.   Exam:  BP 122/82   Pulse 80   Ht 5\' 6"  (1.676 m)   Wt 178 lb (80.7 kg)   SpO2 99%   BMI 28.73 kg/m  General: Well Developed, well nourished, and in no acute distress.   MSK: Bilateral knees moderate joint effusion.  Decreased range of motion.  Tender palpation diffusely. Palpable firm structures in the superior patellar space consistent with the calcific change seen on x-ray previously.    Lab and Radiology Results    Zilretta injection bilateral knee Procedure: Real-time Ultrasound Guided Injection of right knee joint superior lateral patellar space Device: Philips Affiniti 50G Images permanently stored and available for review in PACS Verbal informed consent obtained.  Discussed risks and benefits of procedure. Warned about infection, hyperglycemia bleeding, damage to structures among others. Patient expresses understanding and agreement Time-out conducted.   Noted no overlying erythema, induration, or other signs of local infection.   Skin prepped in a sterile fashion.   Local anesthesia: Topical Ethyl chloride.   With sterile technique and under real time ultrasound guidance: Zilretta 32 mg injected into knee joint. Fluid seen entering the joint capsule.   Completed without difficulty   Advised to call if fevers/chills, erythema,  induration, drainage, or persistent bleeding.   Images permanently stored and available for review in the ultrasound unit.  Impression: Technically successful ultrasound guided injection.  Procedure: Real-time Ultrasound Guided Injection of left knee joint superior medial patellar space Device: Philips Affiniti 50G Images permanently stored and available for review in PACS Verbal informed consent obtained.  Discussed risks and benefits of procedure. Warned about infection, hyperglycemia bleeding, damage to structures among others. Patient expresses understanding and agreement Time-out conducted.   Noted no overlying erythema, induration, or other signs of local infection.   Skin prepped in a sterile fashion.   Local anesthesia: Topical Ethyl chloride.   With sterile technique and under real time ultrasound guidance: Zilretta 32 mg injected into knee joint. Fluid seen entering the joint capsule.   Completed without difficulty   Advised to call if fevers/chills, erythema, induration, drainage, or persistent bleeding.   Images permanently stored and available for review in the ultrasound unit.  Impression: Technically successful ultrasound guided injection.  Lot number: [id number] both injections    Assessment and Plan: 77 y.o. female with bilateral knee pain due to DJD exacerbation.  She is not a good surgical candidate for total knee replacement.  She has been doing pretty well with intermittent Zilretta injections.  Last injection was just 3 months ago.  It lasted until recently.  Plan for repeat Zilretta injection today.  Based on prior experience we can expect this shot to last just about 3 months.  Go ahead and schedule 3 months and will consider Zilretta injection at the next visit.   PDMP  not reviewed this encounter. Orders Placed This Encounter  Procedures   [place] LIMITED JOINT SPACE STRUCTURES LOW BILAT(NO LINKED CHARGES)    Order Specific Question:   Reason for Exam (SYMPTOM  OR  DIAGNOSIS REQUIRED)    Answer:   bilat knee pain    Order Specific Question:   Preferred imaging location?    Answer:   [facility] ordered this encounter  Medications   Triamcinolone Acetonide (ZILRETTA) intra-articular injection 32 mg   Triamcinolone Acetonide (ZILRETTA) intra-articular injection 32 mg     Discussed warning signs or symptoms. Please see discharge instructions. Patient expresses understanding.   The above documentation has been reviewed and is accurate and complete Clementeen Graham, M.D.

## 2022-08-04 NOTE — Progress Notes (Unsigned)
Patient ID: [name], female   DOB: 09-07-1945, 77 y.o.   MRN: [id number]        Chief Complaint: follow up acute URI, left wrist OA, low vit d, DM       HPI:  [name] is a 77 y.o. female  Here with 2-3 days acute onset fever, facial pain, pressure, headache, general weakness and malaise, and greenish d/c, with mild ST and cough, but pt denies chest pain, wheezing, increased sob or doe, orthopnea, PND, increased LE swelling, palpitations, dizziness or syncope.  Has 1 wk wrosening left wrist pain and bony osteophyte to left wrist,   Pt denies polydipsia, polyuria, or new focal neuro s/s.         Wt Readings from Last 3 Encounters:  08/04/22 178 lb (80.7 kg)  08/04/22 178 lb (80.7 kg)  06/21/22 175 lb (79.4 kg)   BP Readings from Last 3 Encounters:  08/04/22 122/82  08/04/22 122/82  06/10/22 (!) 100/56         Past Medical History:  Diagnosis Date   AAA (abdominal aortic aneurysm) (HCC)    a. s/p stent graft repair 01/2016.   Acute ischemic colitis (HCC) 07/29/2010   Diagnosed June, 2012 characterized by acute lower GI bleeding, spontaneously resolved.    Acute respiratory infection 06/09/2014   Acute sinus infection 05/14/2015   Allergic rhinitis, cause unspecified    Angioedema of lips 06/03/2014   Anxiety state, unspecified    Arthritis of left hip 04/16/2015   Bilateral hearing loss 07/19/2017   Chronic LBP    Coronary artery disease    a. inferior MI 1998 s/p PCI of RCA. b. stenting of Cx 04/2010. c. DES to prox LAD 05/2013. d. DES x 3 in 10/2015. // Myoview 07/2018:  EF 59, normal perfusion, low risk    Coronary artery disease involving native heart without angina pectoris 07/31/2006   Qualifier: Diagnosis of  By: Debby Bud MD, Rosalyn Gess  ANGIOGRAPHIC DATA:  1. Ventriculography done in the RAO projection reveals a small wall  motion abnormality in the mid inferior wall. Ejection fraction  would be estimated at around 50%.  2. The right coronary artery has some progressive disease  of about 60-  80% in the proximal mid segment. The distal vessel appears to be  50-70% narrowing althou   Degeneration of lumbar or lumbosacral intervertebral disc    Depressive disorder, not elsewhere classified    Diabetes (HCC) 11/03/2006   Qualifier: Diagnosis of  By: Jonny Ruiz MD, Len Blalock    Diabetes mellitus    TYPE II   Frequent urination 05/08/2014   Gout 08/22/2013   History of endovascular stent graft for abdominal aortic aneurysm (AAA) 01/22/2016   Hyperlipidemia    Hypertension    Hypothyroidism    Iron deficiency anemia 06/13/2017   Lower GI bleed 06/2010   Diverticular bleed   Lumbar radiculopathy 05/15/2015   MENOPAUSAL DISORDER 10/29/2007   Qualifier: Diagnosis of  By: Jonny Ruiz MD, Len Blalock    Myalgia 09/25/2013   Noncompliance with medications 02/26/2014   Obesity, unspecified    OTITIS MEDIA, SEROUS, CHRONIC 04/23/2007   Qualifier: Diagnosis of  By: Debby Bud MD, Rosalyn Gess    Thoracic disc disease 11/19/2018   URTICARIA 09/23/2009   Qualifier: Diagnosis of  By: Felicity Coyer MD, Vikki Ports A    Venous insufficiency 07/19/2017   Past Surgical History:  Procedure Laterality Date   CARDIAC CATHETERIZATION     PCI OF BOTH THE CIRCUMFLEX AND  LEFT ANTERIOR DESCENDING ARTERY   CARDIAC CATHETERIZATION N/A 10/29/2015   Procedure: Coronary Stent Intervention;  Surgeon: Yvonne Kendall, MD;  Location: [facility] INVASIVE CV LAB;  Service: Cardiovascular;  Laterality: N/A;   CARDIAC CATHETERIZATION N/A 10/29/2015   Procedure: Coronary/Graft Angiography;  Surgeon: Yvonne Kendall, MD;  Location: [facility] INVASIVE CV LAB;  Service: Cardiovascular;  Laterality: N/A;   CARDIAC CATHETERIZATION N/A 10/29/2015   Procedure: Intravascular Pressure Wire/FFR Study;  Surgeon: Yvonne Kendall, MD;  Location: [facility] INVASIVE CV LAB;  Service: Cardiovascular;  Laterality: N/A;   CESAREAN SECTION     ENDOVASCULAR STENT INSERTION N/A 01/22/2016   Procedure: ABDOMINAL AORTIC ENDOVASCULAR STENT GRAFT INSERTION;  Surgeon: Larina Earthly, MD;   Location: [facility] OR;  Service: Vascular;  Laterality: N/A;   HEART STENT  04-2010  and  Jun 07, 2013   X 3   LEFT HEART CATH AND CORONARY ANGIOGRAPHY N/A 04/03/2019   Procedure: LEFT HEART CATH AND CORONARY ANGIOGRAPHY;  Surgeon: Yvonne Kendall, MD;  Location: [facility] INVASIVE CV LAB;  Service: Cardiovascular;  Laterality: N/A;   LEFT HEART CATHETERIZATION WITH CORONARY ANGIOGRAM N/A 06/07/2013   Procedure: LEFT HEART CATHETERIZATION WITH CORONARY ANGIOGRAM;  Surgeon: Kathleene Hazel, MD;  Location: [facility] CATH LAB;  Service: Cardiovascular;  Laterality: N/A;   LEFT HEART CATHETERIZATION WITH CORONARY ANGIOGRAM N/A 02/25/2014   Procedure: LEFT HEART CATHETERIZATION WITH CORONARY ANGIOGRAM;  Surgeon: Lennette Bihari, MD;  Location: [facility] CATH LAB;  Service: Cardiovascular;  Laterality: N/A;   LUMBAR FUSION  01/2007   DR. Noel Gerold...3-LEVEL WITH FIXATION   OOPHORECTOMY     BSO? pt.unsure   PARATHYROIDECTOMY     SPINE SURGERY     THYROIDECTOMY     TOTAL ABDOMINAL HYSTERECTOMY      reports that she quit smoking about 36 years ago. Her smoking use included cigarettes. She started smoking about 66 years ago. She has never used smokeless tobacco. She reports that she does not drink alcohol and does not use drugs. family history includes Arthritis in her maternal aunt; Breast cancer in her maternal aunt; Diabetes in her brother and maternal aunt; Heart attack (age of onset: 4) in her mother; Heart attack (age of onset: 33) in her father; Heart disease in her father and mother. Allergies  Allergen Reactions   Crestor [Rosuvastatin Calcium] Other (See Comments)    Feeling poor   Prilosec [Omeprazole] Other (See Comments)    Chest pain   Miconazole Nitrate Hives    REACTION: hives   Augmentin [Amoxicillin-Pot Clavulanate] Hives, Itching and Rash    Has patient had a PCN reaction causing immediate rash, facial/tongue/throat swelling, SOB or lightheadedness with hypotension:unsure Has patient had a PCN reaction  causing severe rash involving mucus membranes or skin necrosis:unsure Has patient had a PCN reaction that required hospitalization:No Has patient had a PCN reaction occurring within the last 10 years:NO If all of the above answers are "NO", then may proceed with Cephalosporin use. Has patient had a PCN reaction causing immediate rash, facial/tongue/throat swelling, SOB or lightheadedness with hypotension:unsure Has patient had a PCN reaction causing severe rash involving mucus membranes or skin necrosis:unsure Has patient had a PCN reaction that required hospitalization:No Has patient had a PCN reaction occurring within the last 10 years:NO If all of the above answers are "NO", then may proceed with Cephalosporin use.    Doxycycline Other (See Comments)    REACTION: gi upset   Current Outpatient Medications on File Prior to Visit  Medication Sig Dispense Refill  allopurinol (ZYLOPRIM) 300 MG tablet Take 1 tablet (300 mg total) by mouth as needed (for Gout). 90 tablet 3   aspirin 81 MG EC tablet Take 81 mg by mouth daily.      atenolol (TENORMIN) 25 MG tablet Take 1 tablet (25 mg total) by mouth daily. Annual appt due in Jan "25" must see provider for future refills 90 tablet 1   celecoxib (CELEBREX) 200 MG capsule Take 1 capsule (200 mg total) by mouth 2 (two) times daily as needed for moderate pain. 180 capsule 1   Cholecalciferol (THERA-D 2000) 50 MCG (2000 UT) TABS 1 tab by mouth once daily (Patient taking differently: 1 tab by mouth once daily, prn) 90 tablet 99   fluconazole (DIFLUCAN) 150 MG tablet 1 tab by mouth every 3 days as needed 2 tablet 1   furosemide (LASIX) 40 MG tablet Take 1 tablet (40 mg total) by mouth daily. (Patient taking differently: Take 40 mg by mouth daily as needed for fluid.) 90 tablet 3   gabapentin (NEURONTIN) 300 MG capsule Take 1-2 tab by mouth at bedtime for pain and sleep 180 capsule 1   nitroGLYCERIN (NITROSTAT) 0.4 MG SL tablet Place 1 tablet (0.4 mg  total) under the tongue every 5 (five) minutes as needed for chest pain. 25 tablet 3   oxyCODONE (OXYCONTIN) 10 mg 12 hr tablet Take 1 tablet (10 mg total) by mouth every 12 (twelve) hours. 14 tablet 0   pantoprazole (PROTONIX) 40 MG tablet Take 40 mg by mouth daily as needed (heartburn acid reflux).     potassium chloride (KLOR-CON) 10 MEQ tablet Take 1 tablet (10 mEq total) by mouth daily. (Patient taking differently: Take 10 mEq by mouth daily as needed.) 90 tablet 3   pregabalin (LYRICA) 75 MG capsule Take 75 mg by mouth daily as needed (pain).      SYNTHROID 150 MCG tablet TAKE 1 TABLET(150 MCG) BY MOUTH DAILY 90 tablet 3   tiZANidine (ZANAFLEX) 2 MG tablet Take 1 tablet (2 mg total) by mouth every 6 (six) hours as needed for muscle spasms. 40 tablet 1   triamcinolone (NASACORT) 55 MCG/ACT AERO nasal inhaler Place 2 sprays into the nose as needed (congestion). 1 each 12   isosorbide mononitrate (IMDUR) 30 MG 24 hr tablet Take 0.5 tablets (15 mg total) by mouth daily. 45 tablet 3   No current facility-administered medications on file prior to visit.        ROS:  All others reviewed and negative.  Objective        PE:  BP 122/82 (BP Location: Left Arm, Patient Position: Sitting, Cuff Size: Normal)   Pulse 80   Temp 98 F (36.7 C) (Oral)   Ht 5\' 6"  (1.676 m)   Wt 178 lb (80.7 kg)   SpO2 99%   BMI 28.73 kg/m                 Constitutional: Pt appears in NAD               HENT: Head: NCAT.                Right Ear: External ear normal.                 Left Ear: External ear normal. Bilat tm's with mild erythema.  Max sinus areas mild tender.  Pharynx with mild erythema, no exudate               Eyes: .  Pupils are equal, round, and reactive to light. Conjunctivae and EOM are normal               Nose: without d/c or deformity               Neck: Neck supple. Gross normal ROM               Cardiovascular: Normal rate and regular rhythm.                 Pulmonary/Chest: Effort normal  and breath sounds without rales or wheezing.                Abd:  Soft, NT, ND, + BS, no organomegaly               Neurological: Pt is alert. At baseline orientation, motor grossly intact               Skin: Skin is warm. No rashes, no other new lesions, LE edema - none               Psychiatric: Pt behavior is normal without agitation   Micro: none  Cardiac tracings I have personally interpreted today:  none  Pertinent Radiological findings (summarize): none   Lab Results  Component Value Date   WBC 7.1 02/10/2022   HGB 13.8 02/10/2022   HCT 42.6 02/10/2022   PLT 274.0 02/10/2022   GLUCOSE 84 08/04/2022   CHOL 160 08/04/2022   TRIG 108.0 08/04/2022   HDL 33.70 (L) 08/04/2022   LDLDIRECT 131.0 03/27/2015   LDLCALC 104 (H) 08/04/2022   ALT 7 08/04/2022   AST 15 08/04/2022   NA 140 08/04/2022   K 4.3 08/04/2022   CL 102 08/04/2022   CREATININE 0.67 08/04/2022   BUN 6 08/04/2022   CO2 33 (H) 08/04/2022   TSH 0.97 02/10/2022   INR 1.08 01/22/2016   HGBA1C 5.3 08/04/2022   MICROALBUR <0.7 02/10/2022   Assessment/Plan:  Jaeleigh LOY LITTLE is a 77 y.o. Black or African American [2] female with  has a past medical history of AAA (abdominal aortic aneurysm) (HCC), Acute ischemic colitis (HCC) (07/29/2010), Acute respiratory infection (06/09/2014), Acute sinus infection (05/14/2015), Allergic rhinitis, cause unspecified, Angioedema of lips (06/03/2014), Anxiety state, unspecified, Arthritis of left hip (04/16/2015), Bilateral hearing loss (07/19/2017), Chronic LBP, Coronary artery disease, Coronary artery disease involving native heart without angina pectoris (07/31/2006), Degeneration of lumbar or lumbosacral intervertebral disc, Depressive disorder, not elsewhere classified, Diabetes (HCC) (11/03/2006), Diabetes mellitus, Frequent urination (05/08/2014), Gout (08/22/2013), History of endovascular stent graft for abdominal aortic aneurysm (AAA) (01/22/2016), Hyperlipidemia, Hypertension, Hypothyroidism,  Iron deficiency anemia (06/13/2017), Lower GI bleed (06/2010), Lumbar radiculopathy (05/15/2015), MENOPAUSAL DISORDER (10/29/2007), Myalgia (09/25/2013), Noncompliance with medications (02/26/2014), Obesity, unspecified, OTITIS MEDIA, SEROUS, CHRONIC (04/23/2007), Thoracic disc disease (11/19/2018), URTICARIA (09/23/2009), and Venous insufficiency (07/19/2017).  Acute upper respiratory infection Mild to mod, for antibx course, cough med prn,  to f/u any worsening symptoms or concerns  Arthritis Left wrist - also for otc voltaren gel prn, also for toradol 30 mg IM  Diabetes (HCC) Lab Results  Component Value Date   HGBA1C 5.3 08/04/2022   Stable, pt to continue current medical treatment  - diet, wt control   Vitamin D deficiency Last vitamin D Lab Results  Component Value Date   VD25OH 7.68 (L) 08/04/2022   Low, to start oral replacement  Followup: No follow-ups on file.  Oliver Barre, MD 08/06/2022 7:19 PM [facility]  [facility]

## 2022-08-04 NOTE — Patient Instructions (Addendum)
Thank you for coming in today.   You received an injection today. Seek immediate medical attention if the joint becomes red, extremely painful, or is oozing fluid.   Recheck in 3 months.    

## 2022-08-04 NOTE — Patient Instructions (Addendum)
You had the toradol pain shot today  Your form was faxed today  Please take all new medication as prescribed - the antibiotic  Ok to use the OTC Voltaren Gel for the left wrist arthritis, and please call if you would want to see [facility]  Please continue all other medications as before, and refills have been done if requested.  Please have the pharmacy call with any other refills you may need.  Please continue your efforts at being more active, low cholesterol diet, and weight control.  Please keep your appointments with your specialists as you may have planned  Please go to the LAB at the blood drawing area for the tests to be done  You will be contacted by phone if any changes need to be made immediately.  Otherwise, you will receive a letter about your results with an explanation, but please check with MyChart first.  Please make an Appointment to return in 6 months, or sooner if needed

## 2022-08-04 NOTE — Progress Notes (Signed)
The test results show that your current treatment is OK, as the tests are stable.  Please continue the same plan.  There is no other need for change of treatment or further evaluation based on these results, at this time.  thanks 

## 2022-08-06 NOTE — Assessment & Plan Note (Signed)
Last vitamin D Lab Results  Component Value Date   VD25OH 7.68 (L) 08/04/2022   Low, to start oral replacement

## 2022-08-06 NOTE — Assessment & Plan Note (Signed)
Mild to mod, for antibx course, cough med prn, to f/u any worsening symptoms or concerns 

## 2022-08-06 NOTE — Assessment & Plan Note (Signed)
Lab Results  Component Value Date   HGBA1C 5.3 08/04/2022   Stable, pt to continue current medical treatment  - diet, wt control

## 2022-08-06 NOTE — Assessment & Plan Note (Addendum)
Left wrist - also for otc voltaren gel prn, also for toradol 30 mg IM

## 2022-10-05 ENCOUNTER — Ambulatory Visit: Payer: Medicare HMO | Attending: Interventional Cardiology

## 2022-10-05 DIAGNOSIS — I25119 Atherosclerotic heart disease of native coronary artery with unspecified angina pectoris: Secondary | ICD-10-CM

## 2022-10-05 LAB — LIPID PANEL
Chol/HDL Ratio: 5 ratio — ABNORMAL HIGH (ref 0.0–4.4)
Cholesterol, Total: 165 mg/dL (ref 100–199)
HDL: 33 mg/dL — ABNORMAL LOW (ref >39)
LDL Chol Calc (NIH): 107 mg/dL — ABNORMAL HIGH (ref 0–99)
Triglycerides: 140 mg/dL (ref 0–149)
VLDL Cholesterol Cal: 25 mg/dL (ref 5–40)

## 2022-10-05 LAB — HEPATIC FUNCTION PANEL
ALT: 6 IU/L (ref 0–32)
AST: 10 IU/L (ref 0–40)
Albumin: 4 g/dL (ref 3.8–4.8)
Alkaline Phosphatase: 113 IU/L (ref 44–121)
Bilirubin Total: 0.6 mg/dL (ref 0.0–1.2)
Bilirubin, Direct: 0.22 mg/dL (ref 0.00–0.40)
Total Protein: 7.1 g/dL (ref 6.0–8.5)

## 2022-10-07 ENCOUNTER — Telehealth: Payer: Self-pay | Admitting: Pharmacist

## 2022-10-07 MED ORDER — ATORVASTATIN CALCIUM 40 MG PO TABS: 40.0000 mg | ORAL_TABLET | Freq: Every day | ORAL | 3 refills | Status: DC

## 2022-10-07 NOTE — Telephone Encounter (Signed)
Spoke with pt. She does not recall why atorvastatin was stopped. She is willing to restart it. I have sent an updated rx into her pharmacy. I will call her in a month to follow up with tolerability then schedule labs.

## 2022-10-07 NOTE — Telephone Encounter (Addendum)
Called pt to review recent lipid panel results and left message. I saw her on 08/01/22 and started her on atorvastatin 40mg  daily. This is no longer on her med list, it was removed at 7/18 PCP follow up, no documented side effects or reason for d/c mentioned. Her LDL remains elevated at her baseline of 107 above goal < 55 given progressive ASCVD. Needs lipid lowering therapy.

## 2022-10-07 NOTE — Telephone Encounter (Signed)
Pt returning nurses phone call. Please advise ?

## 2022-10-18 NOTE — Telephone Encounter (Signed)
VOB initiated for BILAT knee OA.

## 2022-10-19 NOTE — Telephone Encounter (Signed)
Prior Auth REQUIRED for [employer]

## 2022-10-20 NOTE — Telephone Encounter (Signed)
Prior Authorization initiated for ZILETTA via [email or web address] KEY: [id number]

## 2022-10-21 NOTE — Telephone Encounter (Signed)
Zilretta Prior Auth APPROVED PA# [id number] Valid: 11/04/22-01/17/24

## 2022-10-21 NOTE — Telephone Encounter (Addendum)
Zilretta for BILAT knee OA  (Ok to schedule on or after 10/28/22)  Primary Insurance: [employer] Adv [employer] Co-pay: $45 Co-insurance: undisclosed (previously 0%) Deductible: does not apply Prior Auth: APPROVED PA# [id number] Valid: 11/04/22-01/17/24   Knee Injection History 11/21/19 - Kenalog BILAT 03/30/20 - Kenalog BILAT 07/06/20 - Kenalog BILAT 10/01/20 - Kenalog BILAT 01/06/21 - Kenalog BILAT 06/09/21 - Kenalog BILAT 09/09/21 - Kenalog BILAT 12/23/21 - Zilretta BILAT 05/05/22 - Zilretta BILAT 08/04/22 - Zilretta BILAT

## 2022-10-24 NOTE — Telephone Encounter (Signed)
Patient is scheduled on 10.17

## 2022-10-27 ENCOUNTER — Ambulatory Visit (INDEPENDENT_AMBULATORY_CARE_PROVIDER_SITE_OTHER): Payer: Medicare PPO | Admitting: Internal Medicine

## 2022-10-27 ENCOUNTER — Encounter: Payer: Self-pay | Admitting: Internal Medicine

## 2022-10-27 VITALS — BP 122/78 | HR 73 | Temp 99.1°F | Ht 66.0 in | Wt 177.0 lb

## 2022-10-27 DIAGNOSIS — R52 Pain, unspecified: Secondary | ICD-10-CM

## 2022-10-27 DIAGNOSIS — Z23 Encounter for immunization: Secondary | ICD-10-CM

## 2022-10-27 DIAGNOSIS — E782 Mixed hyperlipidemia: Secondary | ICD-10-CM

## 2022-10-27 DIAGNOSIS — F5101 Primary insomnia: Secondary | ICD-10-CM | POA: Diagnosis not present

## 2022-10-27 DIAGNOSIS — J069 Acute upper respiratory infection, unspecified: Secondary | ICD-10-CM

## 2022-10-27 DIAGNOSIS — E1165 Type 2 diabetes mellitus with hyperglycemia: Secondary | ICD-10-CM | POA: Diagnosis not present

## 2022-10-27 DIAGNOSIS — I10 Essential (primary) hypertension: Secondary | ICD-10-CM | POA: Diagnosis not present

## 2022-10-27 MED ORDER — HYDROCODONE BIT-HOMATROP MBR 5-1.5 MG/5ML PO SOLN: 5.0000 mL | Freq: Four times a day (QID) | ORAL | 0 refills | Status: AC | PRN

## 2022-10-27 MED ORDER — TRAZODONE HCL 50 MG PO TABS
25.0000 mg | ORAL_TABLET | Freq: Every evening | ORAL | 1 refills | Status: DC | PRN
Start: 2022-10-27 — End: 2023-03-21

## 2022-10-27 MED ORDER — KETOROLAC TROMETHAMINE 30 MG/ML IJ SOLN
30.0000 mg | Freq: Once | INTRAMUSCULAR | Status: AC
Start: 2022-10-27 — End: 2022-10-27
  Administered 2022-10-27: 30 mg via INTRAMUSCULAR

## 2022-10-27 MED ORDER — AZITHROMYCIN 250 MG PO TABS: ORAL_TABLET | ORAL | 1 refills | Status: AC

## 2022-10-27 NOTE — Patient Instructions (Addendum)
You had the pain shot today (toradol)  Please take all new medication as prescribed  - the antibiotic, and cough medicine if needed  You will be contacted regarding the referral for: trazodone for sleep  Please continue all other medications as before, and refills have been done if requested.  Please have the pharmacy call with any other refills you may need.  Please keep your appointments with your specialists as you may have planned  I think we can hold on other testing today such as labs or xray  Please make an Appointment to return in Jan 20, or sooner if needed

## 2022-10-27 NOTE — Progress Notes (Signed)
Patient ID: [name], female   DOB: October 29, 1945, 77 y.o.   MRN: [id number]        Chief Complaint: follow up URI symptoms, insomnia, dm, hld, htn       HPI:  [name] is a 77 y.o. female here with  Here with 2-3 days acute onset fever, facial pain, pressure, headache, general weakness and malaise, and greenish d/c, with mild ST and cough, but pt denies chest pain, wheezing, increased sob or doe, orthopnea, PND, increased LE swelling, palpitations, dizziness or syncope.  Also has mild worsening nightly insmonia with getting to sleep.  Due for flu shot.   Pt denies polydipsia, polyuria, or new focal neuro s/s.          Wt Readings from Last 3 Encounters:  10/27/22 177 lb (80.3 kg)  08/04/22 178 lb (80.7 kg)  08/04/22 178 lb (80.7 kg)   BP Readings from Last 3 Encounters:  10/27/22 122/78  08/04/22 122/82  08/04/22 122/82         Past Medical History:  Diagnosis Date   AAA (abdominal aortic aneurysm) (HCC)    a. s/p stent graft repair 01/2016.   Acute ischemic colitis (HCC) 07/29/2010   Diagnosed June, 2012 characterized by acute lower GI bleeding, spontaneously resolved.    Acute respiratory infection 06/09/2014   Acute sinus infection 05/14/2015   Allergic rhinitis, cause unspecified    Angioedema of lips 06/03/2014   Anxiety state, unspecified    Arthritis of left hip 04/16/2015   Bilateral hearing loss 07/19/2017   Chronic LBP    Coronary artery disease    a. inferior MI 1998 s/p PCI of RCA. b. stenting of Cx 04/2010. c. DES to prox LAD 05/2013. d. DES x 3 in 10/2015. // Myoview 07/2018:  EF 59, normal perfusion, low risk    Coronary artery disease involving native heart without angina pectoris 07/31/2006   Qualifier: Diagnosis of  By: Debby Bud MD, Rosalyn Gess  ANGIOGRAPHIC DATA:  1. Ventriculography done in the RAO projection reveals a small wall  motion abnormality in the mid inferior wall. Ejection fraction  would be estimated at around 50%.  2. The right coronary artery has some  progressive disease of about 60-  80% in the proximal mid segment. The distal vessel appears to be  50-70% narrowing althou   Degeneration of lumbar or lumbosacral intervertebral disc    Depressive disorder, not elsewhere classified    Diabetes (HCC) 11/03/2006   Qualifier: Diagnosis of  By: Jonny Ruiz MD, Len Blalock    Diabetes mellitus    TYPE II   Frequent urination 05/08/2014   Gout 08/22/2013   History of endovascular stent graft for abdominal aortic aneurysm (AAA) 01/22/2016   Hyperlipidemia    Hypertension    Hypothyroidism    Iron deficiency anemia 06/13/2017   Lower GI bleed 06/2010   Diverticular bleed   Lumbar radiculopathy 05/15/2015   MENOPAUSAL DISORDER 10/29/2007   Qualifier: Diagnosis of  By: Jonny Ruiz MD, Len Blalock    Myalgia 09/25/2013   Noncompliance with medications 02/26/2014   Obesity, unspecified    OTITIS MEDIA, SEROUS, CHRONIC 04/23/2007   Qualifier: Diagnosis of  By: Debby Bud MD, Rosalyn Gess    Thoracic disc disease 11/19/2018   URTICARIA 09/23/2009   Qualifier: Diagnosis of  By: Felicity Coyer MD, Vikki Ports A    Venous insufficiency 07/19/2017   Past Surgical History:  Procedure Laterality Date   CARDIAC CATHETERIZATION     PCI OF BOTH THE CIRCUMFLEX  AND LEFT ANTERIOR DESCENDING ARTERY   CARDIAC CATHETERIZATION N/A 10/29/2015   Procedure: Coronary Stent Intervention;  Surgeon: Yvonne Kendall, MD;  Location: [facility] INVASIVE CV LAB;  Service: Cardiovascular;  Laterality: N/A;   CARDIAC CATHETERIZATION N/A 10/29/2015   Procedure: Coronary/Graft Angiography;  Surgeon: Yvonne Kendall, MD;  Location: [facility] INVASIVE CV LAB;  Service: Cardiovascular;  Laterality: N/A;   CARDIAC CATHETERIZATION N/A 10/29/2015   Procedure: Intravascular Pressure Wire/FFR Study;  Surgeon: Yvonne Kendall, MD;  Location: [facility] INVASIVE CV LAB;  Service: Cardiovascular;  Laterality: N/A;   CESAREAN SECTION     ENDOVASCULAR STENT INSERTION N/A 01/22/2016   Procedure: ABDOMINAL AORTIC ENDOVASCULAR STENT GRAFT INSERTION;  Surgeon: Larina Earthly, MD;  Location: [facility] OR;  Service: Vascular;  Laterality: N/A;   HEART STENT  04-2010  and  Jun 07, 2013   X 3   LEFT HEART CATH AND CORONARY ANGIOGRAPHY N/A 04/03/2019   Procedure: LEFT HEART CATH AND CORONARY ANGIOGRAPHY;  Surgeon: Yvonne Kendall, MD;  Location: [facility] INVASIVE CV LAB;  Service: Cardiovascular;  Laterality: N/A;   LEFT HEART CATHETERIZATION WITH CORONARY ANGIOGRAM N/A 06/07/2013   Procedure: LEFT HEART CATHETERIZATION WITH CORONARY ANGIOGRAM;  Surgeon: Kathleene Hazel, MD;  Location: [facility] CATH LAB;  Service: Cardiovascular;  Laterality: N/A;   LEFT HEART CATHETERIZATION WITH CORONARY ANGIOGRAM N/A 02/25/2014   Procedure: LEFT HEART CATHETERIZATION WITH CORONARY ANGIOGRAM;  Surgeon: Lennette Bihari, MD;  Location: [facility] CATH LAB;  Service: Cardiovascular;  Laterality: N/A;   LUMBAR FUSION  01/2007   DR. Noel Gerold...3-LEVEL WITH FIXATION   OOPHORECTOMY     BSO? pt.unsure   PARATHYROIDECTOMY     SPINE SURGERY     THYROIDECTOMY     TOTAL ABDOMINAL HYSTERECTOMY      reports that she quit smoking about 36 years ago. Her smoking use included cigarettes. She started smoking about 66 years ago. She has never used smokeless tobacco. She reports that she does not drink alcohol and does not use drugs. family history includes Arthritis in her maternal aunt; Breast cancer in her maternal aunt; Diabetes in her brother and maternal aunt; Heart attack (age of onset: 23) in her mother; Heart attack (age of onset: 79) in her father; Heart disease in her father and mother. Allergies  Allergen Reactions   Crestor [Rosuvastatin Calcium] Other (See Comments)    Feeling poor   Prilosec [Omeprazole] Other (See Comments)    Chest pain   Miconazole Nitrate Hives    REACTION: hives   Augmentin [Amoxicillin-Pot Clavulanate] Hives, Itching and Rash    Has patient had a PCN reaction causing immediate rash, facial/tongue/throat swelling, SOB or lightheadedness with hypotension:unsure Has patient had a  PCN reaction causing severe rash involving mucus membranes or skin necrosis:unsure Has patient had a PCN reaction that required hospitalization:No Has patient had a PCN reaction occurring within the last 10 years:NO If all of the above answers are "NO", then may proceed with Cephalosporin use. Has patient had a PCN reaction causing immediate rash, facial/tongue/throat swelling, SOB or lightheadedness with hypotension:unsure Has patient had a PCN reaction causing severe rash involving mucus membranes or skin necrosis:unsure Has patient had a PCN reaction that required hospitalization:No Has patient had a PCN reaction occurring within the last 10 years:NO If all of the above answers are "NO", then may proceed with Cephalosporin use.    Doxycycline Other (See Comments)    REACTION: gi upset   Current Outpatient Medications on File Prior to Visit  Medication Sig Dispense Refill  allopurinol (ZYLOPRIM) 300 MG tablet Take 1 tablet (300 mg total) by mouth as needed (for Gout). 90 tablet 3   aspirin 81 MG EC tablet Take 81 mg by mouth daily.      atenolol (TENORMIN) 25 MG tablet Take 1 tablet (25 mg total) by mouth daily. Annual appt due in Jan "25" must see provider for future refills 90 tablet 1   atorvastatin (LIPITOR) 40 MG tablet Take 1 tablet (40 mg total) by mouth daily. 90 tablet 3   celecoxib (CELEBREX) 200 MG capsule Take 1 capsule (200 mg total) by mouth 2 (two) times daily as needed for moderate pain. 180 capsule 1   Cholecalciferol (THERA-D 2000) 50 MCG (2000 UT) TABS 1 tab by mouth once daily (Patient taking differently: 1 tab by mouth once daily, prn) 90 tablet 99   fluconazole (DIFLUCAN) 150 MG tablet 1 tab by mouth every 3 days as needed 2 tablet 1   furosemide (LASIX) 40 MG tablet Take 1 tablet (40 mg total) by mouth daily. (Patient taking differently: Take 40 mg by mouth daily as needed for fluid.) 90 tablet 3   gabapentin (NEURONTIN) 300 MG capsule Take 1-2 tab by mouth at bedtime  for pain and sleep 180 capsule 1   nitroGLYCERIN (NITROSTAT) 0.4 MG SL tablet Place 1 tablet (0.4 mg total) under the tongue every 5 (five) minutes as needed for chest pain. 25 tablet 3   oxyCODONE (OXYCONTIN) 10 mg 12 hr tablet Take 1 tablet (10 mg total) by mouth every 12 (twelve) hours. 14 tablet 0   pantoprazole (PROTONIX) 40 MG tablet Take 40 mg by mouth daily as needed (heartburn acid reflux).     potassium chloride (KLOR-CON) 10 MEQ tablet Take 1 tablet (10 mEq total) by mouth daily. (Patient taking differently: Take 10 mEq by mouth daily as needed.) 90 tablet 3   pregabalin (LYRICA) 75 MG capsule Take 75 mg by mouth daily as needed (pain).      SYNTHROID 150 MCG tablet TAKE 1 TABLET(150 MCG) BY MOUTH DAILY 90 tablet 3   tiZANidine (ZANAFLEX) 2 MG tablet Take 1 tablet (2 mg total) by mouth every 6 (six) hours as needed for muscle spasms. 40 tablet 1   triamcinolone (NASACORT) 55 MCG/ACT AERO nasal inhaler Place 2 sprays into the nose as needed (congestion). 1 each 12   isosorbide mononitrate (IMDUR) 30 MG 24 hr tablet Take 0.5 tablets (15 mg total) by mouth daily. 45 tablet 3   No current facility-administered medications on file prior to visit.        ROS:  All others reviewed and negative.  Objective        PE:  BP 122/78 (BP Location: Right Arm, Patient Position: Sitting, Cuff Size: Normal)   Pulse 73   Temp 99.1 F (37.3 C) (Oral)   Ht 5\' 6"  (1.676 m)   Wt 177 lb (80.3 kg)   SpO2 99%   BMI 28.57 kg/m                 Constitutional: Pt appears in NAD               HENT: Head: NCAT.                Right Ear: External ear normal.                 Left Ear: External ear normal.  Eyes: . Pupils are equal, round, and reactive to light. Conjunctivae and EOM are normal               Nose: without d/c or deformity               Neck: Neck supple. Gross normal ROM               Cardiovascular: Normal rate and regular rhythm.                 Pulmonary/Chest: Effort  normal and breath sounds without rales or wheezing.                Abd:  Soft, NT, ND, + BS, no organomegaly               Neurological: Pt is alert. At baseline orientation, motor grossly intact               Skin: Skin is warm. No rashes, no other new lesions, LE edema - none               Psychiatric: Pt behavior is normal without agitation   Micro: none  Cardiac tracings I have personally interpreted today:  none  Pertinent Radiological findings (summarize): none   Lab Results  Component Value Date   WBC 7.1 02/10/2022   HGB 13.8 02/10/2022   HCT 42.6 02/10/2022   PLT 274.0 02/10/2022   GLUCOSE 84 08/04/2022   CHOL 165 10/05/2022   TRIG 140 10/05/2022   HDL 33 (L) 10/05/2022   LDLDIRECT 131.0 03/27/2015   LDLCALC 107 (H) 10/05/2022   ALT 6 10/05/2022   AST 10 10/05/2022   NA 140 08/04/2022   K 4.3 08/04/2022   CL 102 08/04/2022   CREATININE 0.67 08/04/2022   BUN 6 08/04/2022   CO2 33 (H) 08/04/2022   TSH 0.97 02/10/2022   INR 1.08 01/22/2016   HGBA1C 5.3 08/04/2022   MICROALBUR <0.7 02/10/2022   Assessment/Plan:  Taziah BELLANIE ASSMANN is a 77 y.o. Black or African American [2] female with  has a past medical history of AAA (abdominal aortic aneurysm) (HCC), Acute ischemic colitis (HCC) (07/29/2010), Acute respiratory infection (06/09/2014), Acute sinus infection (05/14/2015), Allergic rhinitis, cause unspecified, Angioedema of lips (06/03/2014), Anxiety state, unspecified, Arthritis of left hip (04/16/2015), Bilateral hearing loss (07/19/2017), Chronic LBP, Coronary artery disease, Coronary artery disease involving native heart without angina pectoris (07/31/2006), Degeneration of lumbar or lumbosacral intervertebral disc, Depressive disorder, not elsewhere classified, Diabetes (HCC) (11/03/2006), Diabetes mellitus, Frequent urination (05/08/2014), Gout (08/22/2013), History of endovascular stent graft for abdominal aortic aneurysm (AAA) (01/22/2016), Hyperlipidemia, Hypertension,  Hypothyroidism, Iron deficiency anemia (06/13/2017), Lower GI bleed (06/2010), Lumbar radiculopathy (05/15/2015), MENOPAUSAL DISORDER (10/29/2007), Myalgia (09/25/2013), Noncompliance with medications (02/26/2014), Obesity, unspecified, OTITIS MEDIA, SEROUS, CHRONIC (04/23/2007), Thoracic disc disease (11/19/2018), URTICARIA (09/23/2009), and Venous insufficiency (07/19/2017).  Diabetes (HCC) Lab Results  Component Value Date   HGBA1C 5.3 08/04/2022   Stable, pt to continue current medical treatment  - diet, wt control   Mixed hyperlipidemia Lab Results  Component Value Date   LDLCALC 107 (H) 10/05/2022   Uncontrolled, goal ldl < 70, pt to continue current statin lipitor 40 every day, lower chol diet, declines change in tx today    Essential hypertension BP Readings from Last 3 Encounters:  10/27/22 122/78  08/04/22 122/82  08/04/22 122/82   Stable, pt to continue medical treatment tenormin 25 every day,     Insomnia With recent mild worsening,  for trazodone 50 at bedtime prn  Acute upper respiratory infection Mild to mod, for antibx course zpack,a dn cough med prn, to f/u any worsening symptoms or concerns, also for toradol 30 mg Im for myalgia pain  Followup: Return in about 3 months (around 02/06/2023).  Oliver Barre, MD 10/29/2022 7:55 PM [facility]

## 2022-10-29 ENCOUNTER — Encounter: Payer: Self-pay | Admitting: Internal Medicine

## 2022-10-29 NOTE — Assessment & Plan Note (Signed)
Lab Results  Component Value Date   LDLCALC 107 (H) 10/05/2022   Uncontrolled, goal ldl < 70, pt to continue current statin lipitor 40 every day, lower chol diet, declines change in tx today

## 2022-10-29 NOTE — Assessment & Plan Note (Signed)
With recent mild worsening, for trazodone 50 at bedtime prn

## 2022-10-29 NOTE — Assessment & Plan Note (Signed)
Lab Results  Component Value Date   HGBA1C 5.3 08/04/2022   Stable, pt to continue current medical treatment  - diet, wt control

## 2022-10-29 NOTE — Assessment & Plan Note (Signed)
BP Readings from Last 3 Encounters:  10/27/22 122/78  08/04/22 122/82  08/04/22 122/82   Stable, pt to continue medical treatment tenormin 25 every day,

## 2022-10-29 NOTE — Assessment & Plan Note (Addendum)
Mild to mod, for antibx course zpack,a dn cough med prn, to f/u any worsening symptoms or concerns, also for toradol 30 mg Im for myalgia pain

## 2022-11-03 ENCOUNTER — Ambulatory Visit: Payer: Medicare HMO | Admitting: Family Medicine

## 2022-11-04 ENCOUNTER — Telehealth: Payer: Self-pay | Admitting: Pharmacist

## 2022-11-04 DIAGNOSIS — I25119 Atherosclerotic heart disease of native coronary artery with unspecified angina pectoris: Secondary | ICD-10-CM

## 2022-11-04 NOTE — Telephone Encounter (Signed)
Called pt and left message. Need to confirm that she's tolerating her atorvastatin well. If so, will need labs scheduled in the next few weeks to assess efficacy.

## 2022-11-07 ENCOUNTER — Ambulatory Visit (INDEPENDENT_AMBULATORY_CARE_PROVIDER_SITE_OTHER): Payer: Medicare PPO | Admitting: Family Medicine

## 2022-11-07 ENCOUNTER — Other Ambulatory Visit: Payer: Self-pay

## 2022-11-07 VITALS — BP 128/78 | HR 73 | Ht 66.0 in | Wt 177.0 lb

## 2022-11-07 DIAGNOSIS — M25562 Pain in left knee: Secondary | ICD-10-CM

## 2022-11-07 DIAGNOSIS — M25561 Pain in right knee: Secondary | ICD-10-CM

## 2022-11-07 DIAGNOSIS — G8929 Other chronic pain: Secondary | ICD-10-CM | POA: Diagnosis not present

## 2022-11-07 DIAGNOSIS — M170 Bilateral primary osteoarthritis of knee: Secondary | ICD-10-CM

## 2022-11-07 MED ORDER — TRIAMCINOLONE ACETONIDE 32 MG IX SRER
32.0000 mg | Freq: Once | INTRA_ARTICULAR | Status: AC
Start: 2022-11-07 — End: 2022-11-07
  Administered 2022-11-07: 32 mg via INTRA_ARTICULAR

## 2022-11-07 NOTE — Patient Instructions (Signed)
Thank you for coming in today.   Call or go to the ER if you develop a large red swollen joint with extreme pain or oozing puss.    We can do this again in 3 months.  Let me know ahead of time.

## 2022-11-07 NOTE — Progress Notes (Signed)
Rubin Payor, PhD, LAT, ATC acting as a scribe for Clementeen Graham, MD.  [name] is a 77 y.o. female who presents to [facility] today for 65-month f/u bilat knee pain. Pt was last seen by Dr. Denyse Amass on 08/04/22 and was given repeat bilateral Zilretta injections.  Today, pt reports bilat knee pain just returned over the last week. She is afraid of falling, so she is wanting to repeat the injections today.   Dx imaging: 11/21/19 R & L knee XR   Pertinent review of systems: No fevers or chills  Relevant historical information: Hypertension.  Peripheral vascular disease.   Exam:  BP 128/78   Pulse 73   Ht 5\' 6"  (1.676 m)   Wt 177 lb (80.3 kg)   SpO2 98%   BMI 28.57 kg/m  General: Well Developed, well nourished, and in no acute distress.   MSK: Knees bilaterally moderate effusion.  Decreased knee motion.  Antalgic gait.    Lab and Radiology Results   Zilretta injection bilateral knee Procedure: Real-time Ultrasound Guided Injection of right knee joint superior lateral patellar space Device: Philips Affiniti 50G Images permanently stored and available for review in PACS Verbal informed consent obtained.  Discussed risks and benefits of procedure. Warned about infection, hyperglycemia bleeding, damage to structures among others. Patient expresses understanding and agreement Time-out conducted.   Noted no overlying erythema, induration, or other signs of local infection.   Skin prepped in a sterile fashion.   Local anesthesia: Topical Ethyl chloride.   With sterile technique and under real time ultrasound guidance: Zilretta 32 mg injected into knee joint. Fluid seen entering the joint capsule.   Completed without difficulty   Advised to call if fevers/chills, erythema, induration, drainage, or persistent bleeding.   Images permanently stored and available for review in the ultrasound unit.  Impression: Technically successful ultrasound  guided injection.   Procedure: Real-time Ultrasound Guided Injection of left knee joint superior lateral patellar space Device: Philips Affiniti 50G Images permanently stored and available for review in PACS Verbal informed consent obtained.  Discussed risks and benefits of procedure. Warned about infection, hyperglycemia bleeding, damage to structures among others. Patient expresses understanding and agreement Time-out conducted.   Noted no overlying erythema, induration, or other signs of local infection.   Skin prepped in a sterile fashion.   Local anesthesia: Topical Ethyl chloride.   With sterile technique and under real time ultrasound guidance: Zilretta 32 mg injected into knee joint. Fluid seen entering the joint capsule.   Completed without difficulty   Advised to call if fevers/chills, erythema, induration, drainage, or persistent bleeding.   Images permanently stored and available for review in the ultrasound unit.  Impression: Technically successful ultrasound guided injection.  Lot number: [id number] for both injections     Assessment and Plan: 77 y.o. female with bilateral knee pain due to DJD.  Plan for repeat Zilretta injections today.  Check back as needed.  Could repeat this in 3 months.   PDMP not reviewed this encounter. Orders Placed This Encounter  Procedures   [place] LIMITED JOINT SPACE STRUCTURES LOW BILAT(NO LINKED CHARGES)    Order Specific Question:   Reason for Exam (SYMPTOM  OR DIAGNOSIS REQUIRED)    Answer:   bilateral knee pain    Order Specific Question:   Preferred imaging location?    Answer:   Adult nurse [facility] ordered this encounter  Medications   Triamcinolone Acetonide (ZILRETTA)  intra-articular injection 32 mg   Triamcinolone Acetonide (ZILRETTA) intra-articular injection 32 mg     Discussed warning signs or symptoms. Please see discharge instructions. Patient expresses understanding.   The above documentation has  been reviewed and is accurate and complete Clementeen Graham, M.D.

## 2022-11-08 NOTE — Telephone Encounter (Addendum)
Pt received Zilretta injections for BILAT knee OA on 11/07/22. Can consider repeat inj on or after 01/31/23

## 2022-11-15 NOTE — Telephone Encounter (Signed)
Called pt again, she states she picked up atorvastatin but didn't start taking it yet. Plans to start taking it on November 1 so she knows when she starts it? Scheduled follow up labs in mid December to assess efficacy.

## 2022-11-17 DIAGNOSIS — Z961 Presence of intraocular lens: Secondary | ICD-10-CM | POA: Diagnosis not present

## 2022-11-24 ENCOUNTER — Encounter: Payer: Self-pay | Admitting: *Deleted

## 2022-11-24 DIAGNOSIS — Z006 Encounter for examination for normal comparison and control in clinical research program: Secondary | ICD-10-CM

## 2022-11-24 NOTE — Research (Signed)
Participant ID: [id number]  LP(a) Result:  305.1 nmol/L  Date Resulted: July 26, 2022  Date of Coaching: 11-24-2022   [x]  (Please check once complete) Discussed Lp(a) result with participant:    What is Lipoprotein(a) Lp(a)?  What is a "normal" Lp(a) level, and when should I be concerned?  How is Lp(a) related to "bad cholesterol?"  Does a high Lp(a) level increase my risk of heart disease?  How are Lp(a) levels inherited?  Do Lp(a) levels vary by race or ethnicity?  Diagnosing High Lp(a)  What are the signs of high Lp(a)?  How to get an Lp(a) test?  What Lp(a) results are considered high? How do I get a diagnosis of elevated lipoprotein(a)?  How can I lower my Lp(a)?    During the return of results coaching session, all the topics listed above were reviewed with the participant as per the documents: Guidance for Clinician-Patient Lp(a) Risk Assessment Discussion African American Heart Study and Micro-Learning Session: High Lipoprotein(a) 101.  Participant verbalized understanding of test results and what to do next: to ask their healthcare practitioner to test Lp(a) level, advise first-degree relatives to be screened, and work to reduce all cardiovascular risk factors within their control, especially LDL cholesterol.

## 2022-12-07 ENCOUNTER — Other Ambulatory Visit: Payer: Self-pay | Admitting: Internal Medicine

## 2022-12-07 DIAGNOSIS — Z1231 Encounter for screening mammogram for malignant neoplasm of breast: Secondary | ICD-10-CM

## 2022-12-19 DIAGNOSIS — M47812 Spondylosis without myelopathy or radiculopathy, cervical region: Secondary | ICD-10-CM | POA: Diagnosis not present

## 2022-12-19 DIAGNOSIS — M4325 Fusion of spine, thoracolumbar region: Secondary | ICD-10-CM | POA: Diagnosis not present

## 2022-12-29 ENCOUNTER — Ambulatory Visit: Payer: Medicare PPO

## 2023-01-13 ENCOUNTER — Ambulatory Visit: Payer: Medicare PPO

## 2023-01-19 ENCOUNTER — Other Ambulatory Visit: Payer: Self-pay | Admitting: Internal Medicine

## 2023-01-19 NOTE — Telephone Encounter (Signed)
 VOB initiated for Zilretta for BILAT knee OA

## 2023-01-20 ENCOUNTER — Telehealth: Payer: Self-pay | Admitting: Cardiovascular Disease

## 2023-01-20 ENCOUNTER — Other Ambulatory Visit: Payer: Self-pay

## 2023-01-20 MED ORDER — NITROGLYCERIN 0.4 MG SL SUBL: 0.4000 mg | SUBLINGUAL_TABLET | SUBLINGUAL | 1 refills | Status: DC | PRN

## 2023-01-20 NOTE — Telephone Encounter (Signed)
 Pt's medication was sent to pt's pharmacy as requested. Confirmation received.

## 2023-01-20 NOTE — Telephone Encounter (Signed)
 Called and spoke with patient who states she is having intermittent right side chest/arm pain, Nothing seems to trigger it and it is random in nature. States she has taken nitroglycerin  and feels it is helping. Last dose last week. Informed her that her nitroglycerin  refill was sent in to the pharmacy today. She states that she feels concerned as what is causing this type of pain and is requesting to see her doctor with an appointment as its been a while since she was seen. Made appointment on 01/30/23 at 11:20 am.

## 2023-01-20 NOTE — Telephone Encounter (Signed)
*  STAT* If patient is at the pharmacy, call can be transferred to refill team.   1. Which medications need to be refilled? (please list name of each medication and dose if known)  nitroGLYCERIN  (NITROSTAT ) 0.4 MG SL tablet   2. Which pharmacy/location (including street and city if local pharmacy) is medication to be sent to?  [id number] - [place], [place] - [address] AT [facility] OF [address]    3. Do they need a 30 day or 90 day supply? 90   Patient is completely out of this medication.

## 2023-01-20 NOTE — Telephone Encounter (Signed)
 Pt c/o of Chest Pain: STAT if CP now or developed within 24 hours  1. Are you having CP right now? No   2. Are you experiencing any other symptoms (ex. SOB, nausea, vomiting, sweating)? No   3. How long have you been experiencing CP? Shoulder pain and right arm numbness last 3 weeks   4. Is your CP continuous or coming and going? Coming and going   5. Have you taken Nitroglycerin ? Not today - last taken over a week ago.  ?

## 2023-01-23 NOTE — Telephone Encounter (Signed)
 [employer] Stable - Prior Authorization REQUIRED  [employer] [phone]) - Prior Authorization REQUIRED

## 2023-01-23 NOTE — Addendum Note (Signed)
 Addended by: Dierdre Searles on: 01/23/2023 11:49 AM   Modules accepted: Orders

## 2023-01-26 NOTE — Telephone Encounter (Signed)
 Pharmacy benefits  Prior Authorization initiated for [facility] via [email or web address] KEY: [id number]

## 2023-01-27 ENCOUNTER — Other Ambulatory Visit (HOSPITAL_COMMUNITY): Payer: Self-pay

## 2023-01-27 ENCOUNTER — Encounter (HOSPITAL_COMMUNITY): Payer: Self-pay

## 2023-01-27 ENCOUNTER — Other Ambulatory Visit: Payer: Self-pay

## 2023-01-27 MED ORDER — TRIAMCINOLONE ACETONIDE 32 MG IX SRER
INTRA_ARTICULAR | 0 refills | Status: AC
  Filled 2023-01-27: qty 2, fill #0

## 2023-01-27 NOTE — Telephone Encounter (Signed)
 Called pt and left VM to call the office.

## 2023-01-27 NOTE — Telephone Encounter (Addendum)
 Pharmacy benefit  Zilretta APPROVED for BILAT knee OA  PA# [id number] Valid: 01/18/23-01/17/24

## 2023-01-27 NOTE — Addendum Note (Signed)
 Addended by: Dierdre Searles on: 01/27/2023 08:40 AM   Modules accepted: Orders

## 2023-01-30 ENCOUNTER — Ambulatory Visit: Payer: Medicare HMO | Admitting: Cardiovascular Disease

## 2023-02-01 ENCOUNTER — Other Ambulatory Visit: Payer: Self-pay

## 2023-02-02 ENCOUNTER — Ambulatory Visit: Payer: Medicare PPO

## 2023-02-02 ENCOUNTER — Ambulatory Visit: Payer: Medicare HMO | Admitting: Family Medicine

## 2023-02-02 ENCOUNTER — Other Ambulatory Visit: Payer: Self-pay

## 2023-02-06 ENCOUNTER — Ambulatory Visit: Payer: Medicare HMO | Admitting: Family Medicine

## 2023-02-06 ENCOUNTER — Ambulatory Visit: Payer: Self-pay | Admitting: Internal Medicine

## 2023-02-07 ENCOUNTER — Other Ambulatory Visit: Payer: Self-pay

## 2023-02-07 NOTE — Telephone Encounter (Signed)
MEDICAL BUY AND BILL  Prior Authorization initiated for [facility] via [email or web address] KEY: [id number]

## 2023-02-07 NOTE — Progress Notes (Signed)
Pharmacy Patient Advocate Encounter  Insurance verification completed.   The patient is insured through [employer] claim for [employer]. Currently a quantity of 2 is a 28 day supply and the co-pay is $668.61.  Patient may be eligible for a Medicare prescription Payment plan. The patient will need to reach out to their insurance company to enroll in the payment plan to spread out their payments throughout the year, If available.  Patient does not want to pay high copay or enroll in payment plan. Office is aware. They are looking into alternative options and will reach out to patient. Dis-enrolling

## 2023-02-08 NOTE — Telephone Encounter (Signed)
Medical [employer] and [employer] Stable  Prior Authorization for [employer] for BILAT knee OA APPROVED PA# [id number] Valid: 11/04/22-01/17/24

## 2023-02-09 NOTE — Telephone Encounter (Signed)
[name] for BILAT knee OA   [employer]  Primary Insurance: [employer] Medicare Adv Co-pay: $15 Co-insurance: undisclosed Deductible: does not apply Prior Auth: APPROVED PA# [id number] Valid: 11/04/22-01/17/24   Pharmacy Benefits - [facility] pharmacy APPROVED PA# [id number] Valid: 01/18/23-01/17/24  COST BARRIER    Knee Injection History 11/21/19 - Kenalog BILAT 03/30/20 - Kenalog BILAT 07/06/20 - Kenalog BILAT 10/01/20 - Kenalog BILAT 01/06/21 - Kenalog BILAT 06/09/21 - Kenalog BILAT 09/09/21 - Kenalog BILAT 12/23/21 - Zilretta BILAT 05/05/22 - Zilretta BILAT 08/04/22 - Zilretta BILAT 11/07/22 - Zilretta BILAT

## 2023-02-09 NOTE — Telephone Encounter (Signed)
Scheduled for follow up on 2/4.

## 2023-02-16 ENCOUNTER — Ambulatory Visit: Payer: Medicare PPO

## 2023-02-21 ENCOUNTER — Ambulatory Visit (INDEPENDENT_AMBULATORY_CARE_PROVIDER_SITE_OTHER): Payer: Medicare HMO | Admitting: Family Medicine

## 2023-02-21 ENCOUNTER — Ambulatory Visit (INDEPENDENT_AMBULATORY_CARE_PROVIDER_SITE_OTHER): Payer: 59 | Admitting: Internal Medicine

## 2023-02-21 ENCOUNTER — Ambulatory Visit (INDEPENDENT_AMBULATORY_CARE_PROVIDER_SITE_OTHER): Payer: Medicare HMO

## 2023-02-21 ENCOUNTER — Encounter: Payer: Self-pay | Admitting: Family Medicine

## 2023-02-21 ENCOUNTER — Encounter: Payer: Self-pay | Admitting: Internal Medicine

## 2023-02-21 ENCOUNTER — Ambulatory Visit: Payer: Self-pay

## 2023-02-21 VITALS — BP 122/76 | HR 67 | Temp 98.0°F | Ht 66.0 in | Wt 175.0 lb

## 2023-02-21 VITALS — BP 110/72 | HR 68 | Ht 66.0 in

## 2023-02-21 DIAGNOSIS — R918 Other nonspecific abnormal finding of lung field: Secondary | ICD-10-CM | POA: Diagnosis not present

## 2023-02-21 DIAGNOSIS — R0989 Other specified symptoms and signs involving the circulatory and respiratory systems: Secondary | ICD-10-CM | POA: Diagnosis not present

## 2023-02-21 DIAGNOSIS — R079 Chest pain, unspecified: Secondary | ICD-10-CM

## 2023-02-21 DIAGNOSIS — M170 Bilateral primary osteoarthritis of knee: Secondary | ICD-10-CM

## 2023-02-21 DIAGNOSIS — R0781 Pleurodynia: Secondary | ICD-10-CM | POA: Diagnosis not present

## 2023-02-21 DIAGNOSIS — Z0001 Encounter for general adult medical examination with abnormal findings: Secondary | ICD-10-CM

## 2023-02-21 DIAGNOSIS — M25561 Pain in right knee: Secondary | ICD-10-CM

## 2023-02-21 DIAGNOSIS — Z0000 Encounter for general adult medical examination without abnormal findings: Secondary | ICD-10-CM

## 2023-02-21 DIAGNOSIS — M25562 Pain in left knee: Secondary | ICD-10-CM

## 2023-02-21 DIAGNOSIS — I10 Essential (primary) hypertension: Secondary | ICD-10-CM

## 2023-02-21 DIAGNOSIS — E559 Vitamin D deficiency, unspecified: Secondary | ICD-10-CM | POA: Diagnosis not present

## 2023-02-21 DIAGNOSIS — E1165 Type 2 diabetes mellitus with hyperglycemia: Secondary | ICD-10-CM | POA: Diagnosis not present

## 2023-02-21 DIAGNOSIS — G8929 Other chronic pain: Secondary | ICD-10-CM | POA: Diagnosis not present

## 2023-02-21 DIAGNOSIS — E039 Hypothyroidism, unspecified: Secondary | ICD-10-CM | POA: Diagnosis not present

## 2023-02-21 DIAGNOSIS — R051 Acute cough: Secondary | ICD-10-CM | POA: Diagnosis not present

## 2023-02-21 LAB — HEPATIC FUNCTION PANEL
ALT: 9 U/L (ref 0–35)
AST: 14 U/L (ref 0–37)
Albumin: 3.3 g/dL — ABNORMAL LOW (ref 3.5–5.2)
Alkaline Phosphatase: 109 U/L (ref 39–117)
Bilirubin, Direct: 0.1 mg/dL (ref 0.0–0.3)
Total Bilirubin: 0.8 mg/dL (ref 0.2–1.2)
Total Protein: 7.6 g/dL (ref 6.0–8.3)

## 2023-02-21 LAB — MICROALBUMIN / CREATININE URINE RATIO
Creatinine,U: 73 mg/dL
Microalb Creat Ratio: 1 mg/g (ref 0.0–30.0)
Microalb, Ur: <0.7 mg/dL (ref 0.0–1.9)

## 2023-02-21 LAB — CBC WITH DIFFERENTIAL/PLATELET
Basophils Absolute: 0 10*3/uL (ref 0.0–0.1)
Basophils Relative: 0.4 % (ref 0.0–3.0)
Eosinophils Absolute: 0.1 10*3/uL (ref 0.0–0.7)
Eosinophils Relative: 1.1 % (ref 0.0–5.0)
HCT: 41 % (ref 36.0–46.0)
Hemoglobin: 13.1 g/dL (ref 12.0–15.0)
Lymphocytes Relative: 22.1 % (ref 12.0–46.0)
Lymphs Abs: 1.5 10*3/uL (ref 0.7–4.0)
MCHC: 32 g/dL (ref 30.0–36.0)
MCV: 86 fL (ref 78.0–100.0)
Monocytes Absolute: 0.6 10*3/uL (ref 0.1–1.0)
Monocytes Relative: 8.7 % (ref 3.0–12.0)
Neutro Abs: 4.7 10*3/uL (ref 1.4–7.7)
Neutrophils Relative %: 67.7 % (ref 43.0–77.0)
Platelets: 352 10*3/uL (ref 150.0–400.0)
RBC: 4.77 Mil/uL (ref 3.87–5.11)
RDW: 14.9 % (ref 11.5–15.5)
WBC: 7 10*3/uL (ref 4.0–10.5)

## 2023-02-21 LAB — BASIC METABOLIC PANEL
BUN: 5 mg/dL — ABNORMAL LOW (ref 6–23)
CO2: 32 meq/L (ref 19–32)
Calcium: 8.5 mg/dL (ref 8.4–10.5)
Chloride: 99 meq/L (ref 96–112)
Creatinine, Ser: 0.6 mg/dL (ref 0.40–1.20)
GFR: 86.52 mL/min (ref >60.00)
Glucose, Bld: 79 mg/dL (ref 70–99)
Potassium: 3.2 meq/L — ABNORMAL LOW (ref 3.5–5.1)
Sodium: 140 meq/L (ref 135–145)

## 2023-02-21 LAB — LIPID PANEL
Cholesterol: 129 mg/dL (ref 0–200)
HDL: 27.6 mg/dL — ABNORMAL LOW (ref >39.00)
LDL Cholesterol: 80 mg/dL (ref 0–99)
NonHDL: 101.15
Total CHOL/HDL Ratio: 5
Triglycerides: 106 mg/dL (ref 0.0–149.0)
VLDL: 21.2 mg/dL (ref 0.0–40.0)

## 2023-02-21 LAB — HEMOGLOBIN A1C: Hgb A1c MFr Bld: 5.5 % (ref 4.6–6.5)

## 2023-02-21 LAB — TSH: TSH: 0.04 u[IU]/mL — ABNORMAL LOW (ref 0.35–5.50)

## 2023-02-21 MED ORDER — KETOROLAC TROMETHAMINE 30 MG/ML IJ SOLN
30.0000 mg | Freq: Once | INTRAMUSCULAR | Status: AC
  Administered 2023-02-21: 30 mg via INTRAMUSCULAR

## 2023-02-21 MED ORDER — TRIAMCINOLONE ACETONIDE 32 MG IX SRER
32.0000 mg | Freq: Once | INTRA_ARTICULAR | Status: AC
  Administered 2023-02-21: 32 mg via INTRA_ARTICULAR

## 2023-02-21 MED ORDER — HYDROCODONE BIT-HOMATROP MBR 5-1.5 MG/5ML PO SOLN: 5.0000 mL | Freq: Four times a day (QID) | ORAL | 0 refills | Status: DC | PRN

## 2023-02-21 MED ORDER — LEVOFLOXACIN 500 MG PO TABS: 500.0000 mg | ORAL_TABLET | Freq: Every day | ORAL | 0 refills | Status: DC

## 2023-02-21 NOTE — Patient Instructions (Signed)
 You had the pain shot today - toradol  30 mg  Please take all new medication as prescribed - the antibiotic and cough medicine  Please continue all other medications as before, and refills have been done if requested.  Please have the pharmacy call with any other refills you may need.  Please continue your efforts at being more active, low cholesterol diet, and weight control.  You are otherwise up to date with prevention measures today.  Please keep your appointments with your specialists as you may have planned  Please go to the XRAY Department in the first floor for the x-ray testing  Please go to the LAB at the blood drawing area for the tests to be done  You will be contacted by phone if any changes need to be made immediately.  Otherwise, you will receive a letter about your results with an explanation, but please check with MyChart first.  Please make an Appointment to return in 6 months, or sooner if needed

## 2023-02-21 NOTE — Patient Instructions (Signed)
 Thank you for coming in today.   Call or go to the ER if you develop a large red swollen joint with extreme pain or oozing puss.    We can do the injection again in 3 months.   That bump on your leg a Seborrheic keratosis   If the shots are not working next step is is a geniculate artery embolization.

## 2023-02-21 NOTE — Telephone Encounter (Signed)
Last Zilretta injection 02/21/23 Can consider repeat injection on or after 05/17/23.

## 2023-02-21 NOTE — Progress Notes (Signed)
 I, Leotis Batter, CMA acting as a scribe for Artist Lloyd, MD.  [name] is a 78 y.o. female who presents to [facility] today for  65-month f/u bilat knee pain. Pt was last seen by Dr. Lloyd on 11/07/22 and was given repeat bilateral Zilretta  injections.   Today, pt reports worsening knee pain over the past few weeks. Notes significant relief with Zilretta  injections.   Dx imaging: 11/21/19 R & L knee XR   Pertinent review of systems: No fevers or chills  Relevant historical information: Peripheral vascular disease   Exam:  BP 110/72   Pulse 68   Ht 5' 6 (1.676 m)   SpO2 96%   BMI 28.57 kg/m  General: Well Developed, well nourished, and in no acute distress.   MSK: Bilateral knees moderate effusion.  Decreased range of motion.    Lab and Radiology Results   Zilretta  injection bilateral knee Procedure: Real-time Ultrasound Guided Injection of right knee joint superior lateral patellar space Device: Philips Affiniti 50G Images permanently stored and available for review in PACS Verbal informed consent obtained.  Discussed risks and benefits of procedure. Warned about infection, hyperglycemia bleeding, damage to structures among others. Patient expresses understanding and agreement Time-out conducted.   Noted no overlying erythema, induration, or other signs of local infection.   Skin prepped in a sterile fashion.   Local anesthesia: Topical Ethyl chloride.   With sterile technique and under real time ultrasound guidance: Zilretta  32 mg injected into knee joint. Fluid seen entering the joint capsule.   Completed without difficulty   Advised to call if fevers/chills, erythema, induration, drainage, or persistent bleeding.   Images permanently stored and available for review in the ultrasound unit.  Impression: Technically successful ultrasound guided injection.   Procedure: Real-time Ultrasound Guided Injection of left knee joint  superior lateral patellar space Device: Philips Affiniti 50G Images permanently stored and available for review in PACS Verbal informed consent obtained.  Discussed risks and benefits of procedure. Warned about infection, hyperglycemia bleeding, damage to structures among others. Patient expresses understanding and agreement Time-out conducted.   Noted no overlying erythema, induration, or other signs of local infection.   Skin prepped in a sterile fashion.   Local anesthesia: Topical Ethyl chloride.   With sterile technique and under real time ultrasound guidance: Zilretta  32 mg injected into knee joint. Fluid seen entering the joint capsule.   Completed without difficulty   Advised to call if fevers/chills, erythema, induration, drainage, or persistent bleeding.   Images permanently stored and available for review in the ultrasound unit.  Impression: Technically successful ultrasound guided injection.  Lot number: [id number]     Assessment and Plan: 78 y.o. female with bilateral knee pain due to DJD.  Plan for repeat Zilretta  injection today.  We can repeat this injection every 3 months if needed.  If this is not sufficient next step may be genicular artery embolization.  Check back as needed.   PDMP not reviewed this encounter. Orders Placed This Encounter  Procedures   US  LIMITED JOINT SPACE STRUCTURES LOW BILAT(NO LINKED CHARGES)    Reason for Exam (SYMPTOM  OR DIAGNOSIS REQUIRED):   bilat knee pain    Preferred imaging location?:   [facility] ordered this encounter  Medications   Triamcinolone  Acetonide (ZILRETTA ) intra-articular injection 32 mg   Triamcinolone  Acetonide (ZILRETTA ) intra-articular injection 32 mg     Discussed warning signs or symptoms. Please see  discharge instructions. Patient expresses understanding.   The above documentation has been reviewed and is accurate and complete Artist Lloyd, M.D.

## 2023-02-21 NOTE — Progress Notes (Signed)
 Patient ID: [name], female   DOB: 25-Jan-1945, 78 y.o.   MRN: [id number]         Chief Complaint:: wellness exam and right chest pain after fall, cough with RLL rales feels ill, dm, htn, low vit d, low thyroid        HPI:  [name] is a 78 y.o. female here for wellness exam; declines tdap, o/w up to date                        Also had recent stumble and accidental fall to right side, now with lateral right sided sharp pleuritic chest pain, with mild sob and cough with deeper breaths.  Also feels ill today, achy all over, right ear fulness, feels cold and ill, and has few RLL rales on exam today.   Pt denies polydipsia, polyuria, or new focal neuro s/s.    Pt denies fever, wt loss, night sweats, loss of appetite, or other constitutional symptoms  Pt denies wheezing, orthopnea, PND, increased LE swelling, palpitations, dizziness or syncope. Did have cortisone to both knees recently and now pain improved   Wt Readings from Last 3 Encounters:  02/21/23 175 lb (79.4 kg)  11/07/22 177 lb (80.3 kg)  10/27/22 177 lb (80.3 kg)   BP Readings from Last 3 Encounters:  02/21/23 122/76  02/21/23 110/72  11/07/22 128/78   Immunization History  Administered Date(s) Administered   Fluad Quad(high Dose 65+) 11/15/2019, 09/17/2020, 10/11/2021   Fluad Trivalent(High Dose 65+) 10/27/2022   Influenza Split 11/02/2010, 10/20/2011   Influenza Whole 11/23/2005, 10/15/2007, 10/31/2008, 11/11/2009   Influenza, High Dose Seasonal PF 09/23/2016, 10/19/2017   Influenza,inj,Quad PF,6+ Mos 10/18/2012, 09/05/2013, 10/30/2015   Influenza-Unspecified 10/18/2014, 09/23/2016, 09/11/2018   PFIZER(Purple Top)SARS-COV-2 Vaccination 03/02/2019, 03/27/2019, 11/05/2019, 10/19/2020   Pneumococcal Conjugate-13 10/18/2012, 02/21/2013   Pneumococcal Polysaccharide-23 11/17/2005, 11/09/2010   Pneumococcal-Unspecified 11/17/2005, 11/09/2010, 10/18/2012, 02/21/2013   Tetanus 02/21/2013   Zoster Recombinant(Shingrix)  09/12/2018, 11/16/2018   Zoster, Live 11/23/2005   Health Maintenance Due  Topic Date Due   DTaP/Tdap/Td (1 - Tdap) 02/22/2013   Medicare Annual Wellness (AWV)  04/05/2023      Past Medical History:  Diagnosis Date   AAA (abdominal aortic aneurysm) (HCC)    a. s/p stent graft repair 01/2016.   Acute ischemic colitis (HCC) 07/29/2010   Diagnosed June, 2012 characterized by acute lower GI bleeding, spontaneously resolved.    Acute respiratory infection 06/09/2014   Acute sinus infection 05/14/2015   Allergic rhinitis, cause unspecified    Angioedema of lips 06/03/2014   Anxiety state, unspecified    Arthritis of left hip 04/16/2015   Bilateral hearing loss 07/19/2017   Chronic LBP    Coronary artery disease    a. inferior MI 1998 s/p PCI of RCA. b. stenting of Cx 04/2010. c. DES to prox LAD 05/2013. d. DES x 3 in 10/2015. // Myoview  07/2018:  EF 59, normal perfusion, low risk    Coronary artery disease involving native heart without angina pectoris 07/31/2006   Qualifier: Diagnosis of  By: Harlow MD, Ozell BRAVO  ANGIOGRAPHIC DATA:  1. Ventriculography done in the RAO projection reveals a small wall  motion abnormality in the mid inferior wall. Ejection fraction  would be estimated at around 50%.  2. The right coronary artery has some progressive disease of about 60-  80% in the proximal mid segment. The distal vessel appears to be  50-70% narrowing althou   Degeneration of  lumbar or lumbosacral intervertebral disc    Depressive disorder, not elsewhere classified    Diabetes (HCC) 11/03/2006   Qualifier: Diagnosis of  By: [name] MD, Lynwood ORN    Diabetes mellitus    TYPE II   Frequent urination 05/08/2014   Gout 08/22/2013   History of endovascular stent graft for abdominal aortic aneurysm (AAA) 01/22/2016   Hyperlipidemia    Hypertension    Hypothyroidism    Iron  deficiency anemia 06/13/2017   Lower GI bleed 06/2010   Diverticular bleed   Lumbar radiculopathy 05/15/2015   MENOPAUSAL DISORDER  10/29/2007   Qualifier: Diagnosis of  By: [name] MD, Lynwood ORN    Myalgia 09/25/2013   Noncompliance with medications 02/26/2014   Obesity, unspecified    OTITIS MEDIA, SEROUS, CHRONIC 04/23/2007   Qualifier: Diagnosis of  By: Harlow MD, Ozell BRAVO    Thoracic disc disease 11/19/2018   URTICARIA 09/23/2009   Qualifier: Diagnosis of  By: Inocencio MD, Berwyn A    Venous insufficiency 07/19/2017   Past Surgical History:  Procedure Laterality Date   CARDIAC CATHETERIZATION     PCI OF BOTH THE CIRCUMFLEX AND LEFT ANTERIOR DESCENDING ARTERY   CARDIAC CATHETERIZATION N/A 10/29/2015   Procedure: Coronary Stent Intervention;  Surgeon: Lonni Hanson, MD;  Location: [facility] INVASIVE CV LAB;  Service: Cardiovascular;  Laterality: N/A;   CARDIAC CATHETERIZATION N/A 10/29/2015   Procedure: Coronary/Graft Angiography;  Surgeon: Lonni Hanson, MD;  Location: [facility] INVASIVE CV LAB;  Service: Cardiovascular;  Laterality: N/A;   CARDIAC CATHETERIZATION N/A 10/29/2015   Procedure: Intravascular Pressure Wire/FFR Study;  Surgeon: Lonni Hanson, MD;  Location: [facility] INVASIVE CV LAB;  Service: Cardiovascular;  Laterality: N/A;   CESAREAN SECTION     ENDOVASCULAR STENT INSERTION N/A 01/22/2016   Procedure: ABDOMINAL AORTIC ENDOVASCULAR STENT GRAFT INSERTION;  Surgeon: Krystal JULIANNA Doing, MD;  Location: [facility] OR;  Service: Vascular;  Laterality: N/A;   HEART STENT  04-2010  and  Jun 07, 2013   X 3   LEFT HEART CATH AND CORONARY ANGIOGRAPHY N/A 04/03/2019   Procedure: LEFT HEART CATH AND CORONARY ANGIOGRAPHY;  Surgeon: Hanson Lonni, MD;  Location: [facility] INVASIVE CV LAB;  Service: Cardiovascular;  Laterality: N/A;   LEFT HEART CATHETERIZATION WITH CORONARY ANGIOGRAM N/A 06/07/2013   Procedure: LEFT HEART CATHETERIZATION WITH CORONARY ANGIOGRAM;  Surgeon: Lonni JONETTA Cash, MD;  Location: [facility] CATH LAB;  Service: Cardiovascular;  Laterality: N/A;   LEFT HEART CATHETERIZATION WITH CORONARY ANGIOGRAM N/A 02/25/2014   Procedure: LEFT HEART  CATHETERIZATION WITH CORONARY ANGIOGRAM;  Surgeon: Debby DELENA Sor, MD;  Location: [facility] CATH LAB;  Service: Cardiovascular;  Laterality: N/A;   LUMBAR FUSION  01/2007   DR. GUST...3-LEVEL WITH FIXATION   OOPHORECTOMY     BSO? pt.unsure   PARATHYROIDECTOMY     SPINE SURGERY     THYROIDECTOMY     TOTAL ABDOMINAL HYSTERECTOMY      reports that she quit smoking about 36 years ago. Her smoking use included cigarettes. She started smoking about 66 years ago. She has never used smokeless tobacco. She reports that she does not drink alcohol and does not use drugs. family history includes Arthritis in her maternal aunt; Breast cancer in her maternal aunt; Diabetes in her brother and maternal aunt; Heart attack (age of onset: 45) in her mother; Heart attack (age of onset: 63) in her father; Heart disease in her father and mother. Allergies  Allergen Reactions   Crestor  [Rosuvastatin  Calcium ] Other (See Comments)    Feeling  poor   Prilosec [Omeprazole ] Other (See Comments)    Chest pain   Miconazole Nitrate Hives    REACTION: hives   Augmentin  [Amoxicillin -Pot Clavulanate] Hives, Itching and Rash    Has patient had a PCN reaction causing immediate rash, facial/tongue/throat swelling, SOB or lightheadedness with hypotension:unsure Has patient had a PCN reaction causing severe rash involving mucus membranes or skin necrosis:unsure Has patient had a PCN reaction that required hospitalization:No Has patient had a PCN reaction occurring within the last 10 years:NO If all of the above answers are NO, then may proceed with Cephalosporin use. Has patient had a PCN reaction causing immediate rash, facial/tongue/throat swelling, SOB or lightheadedness with hypotension:unsure Has patient had a PCN reaction causing severe rash involving mucus membranes or skin necrosis:unsure Has patient had a PCN reaction that required hospitalization:No Has patient had a PCN reaction occurring within the last 10 years:NO If  all of the above answers are NO, then may proceed with Cephalosporin use.    Doxycycline  Other (See Comments)    REACTION: gi upset   Current Outpatient Medications on File Prior to Visit  Medication Sig Dispense Refill   allopurinol  (ZYLOPRIM ) 300 MG tablet Take 1 tablet (300 mg total) by mouth as needed (for Gout). 90 tablet 3   aspirin  81 MG EC tablet Take 81 mg by mouth daily.      atenolol  (TENORMIN ) 25 MG tablet TAKE 1 TABLET BY MOUTH EVERY DAY. 90 tablet 1   celecoxib  (CELEBREX ) 200 MG capsule Take 1 capsule (200 mg total) by mouth 2 (two) times daily as needed for moderate pain. 180 capsule 1   Cholecalciferol  (THERA-D 2000) 50 MCG (2000 UT) TABS 1 tab by mouth once daily (Patient taking differently: 1 tab by mouth once daily, prn) 90 tablet 99   fluconazole  (DIFLUCAN ) 150 MG tablet 1 tab by mouth every 3 days as needed 2 tablet 1   furosemide  (LASIX ) 40 MG tablet Take 1 tablet (40 mg total) by mouth daily. (Patient taking differently: Take 40 mg by mouth daily as needed for fluid.) 90 tablet 3   gabapentin  (NEURONTIN ) 300 MG capsule Take 1-2 tab by mouth at bedtime for pain and sleep 180 capsule 1   nitroGLYCERIN  (NITROSTAT ) 0.4 MG SL tablet Place 1 tablet (0.4 mg total) under the tongue every 5 (five) minutes as needed for chest pain. 75 tablet 1   oxyCODONE  (OXYCONTIN ) 10 mg 12 hr tablet Take 1 tablet (10 mg total) by mouth every 12 (twelve) hours. 14 tablet 0   pantoprazole  (PROTONIX ) 40 MG tablet Take 40 mg by mouth daily as needed (heartburn acid reflux).     pregabalin  (LYRICA ) 75 MG capsule Take 75 mg by mouth daily as needed (pain).      tiZANidine  (ZANAFLEX ) 2 MG tablet Take 1 tablet (2 mg total) by mouth every 6 (six) hours as needed for muscle spasms. 40 tablet 1   traZODone  (DESYREL ) 50 MG tablet Take 0.5-1 tablets (25-50 mg total) by mouth at bedtime as needed for sleep. 90 tablet 1   triamcinolone  (NASACORT ) 55 MCG/ACT AERO nasal inhaler Place 2 sprays into the nose as  needed (congestion). 1 each 12   Triamcinolone  Acetonide (ZILRETTA ) 32 MG SRER intra-articular injection 5 mL, intra-articular, bilat knees 2 each 0   atorvastatin  (LIPITOR ) 40 MG tablet Take 1 tablet (40 mg total) by mouth daily. 90 tablet 3   isosorbide  mononitrate (IMDUR ) 30 MG 24 hr tablet Take 0.5 tablets (15 mg total) by mouth daily.  45 tablet 3   No current facility-administered medications on file prior to visit.        ROS:  All others reviewed and negative.  Objective        PE:  BP 122/76 (BP Location: Left Arm, Patient Position: Sitting, Cuff Size: Normal)   Pulse 67   Temp 98 F (36.7 C) (Oral)   Ht 5' 6 (1.676 m)   Wt 175 lb (79.4 kg)   SpO2 98%   BMI 28.25 kg/m                 Constitutional: Pt appears fatigued, mild ill               HENT: Head: NCAT.                Right Ear: External ear normal.                 Left Ear: External ear normal.                Eyes: . Pupils are equal, round, and reactive to light. Conjunctivae and EOM are normal               Nose: without d/c or deformity               Neck: Neck supple. Gross normal ROM               Cardiovascular: Normal rate and regular rhythm.                 Pulmonary/Chest: Effort normal and breath sounds with few RLL rales or wheezing.                Abd:  Soft, NT, ND, + BS, no organomegaly               Neurological: Pt is alert. At baseline orientation, motor grossly intact               Skin: Skin is warm. No rashes, no other new lesions, LE edema - none               Psychiatric: Pt behavior is normal without agitation   Micro: none  Cardiac tracings I have personally interpreted today:  none  Pertinent Radiological findings (summarize): none   Lab Results  Component Value Date   WBC 7.0 02/21/2023   HGB 13.1 02/21/2023   HCT 41.0 02/21/2023   PLT 352.0 02/21/2023   GLUCOSE 79 02/21/2023   CHOL 129 02/21/2023   TRIG 106.0 02/21/2023   HDL 27.60 (L) 02/21/2023   LDLDIRECT 131.0  03/27/2015   LDLCALC 80 02/21/2023   ALT 9 02/21/2023   AST 14 02/21/2023   NA 140 02/21/2023   K 3.2 (L) 02/21/2023   CL 99 02/21/2023   CREATININE 0.60 02/21/2023   BUN 5 (L) 02/21/2023   CO2 32 02/21/2023   TSH 0.04 (L) 02/21/2023   INR 1.08 01/22/2016   HGBA1C 5.5 02/21/2023   MICROALBUR <0.7 02/21/2023   Assessment/Plan:  Nuriyah TAMRA KOOS is a 78 y.o. Black or African American [2] female with  has a past medical history of AAA (abdominal aortic aneurysm) (HCC), Acute ischemic colitis (HCC) (07/29/2010), Acute respiratory infection (06/09/2014), Acute sinus infection (05/14/2015), Allergic rhinitis, cause unspecified, Angioedema of lips (06/03/2014), Anxiety state, unspecified, Arthritis of left hip (04/16/2015), Bilateral hearing loss (07/19/2017), Chronic LBP, Coronary artery disease, Coronary artery disease involving native heart without angina pectoris (  07/31/2006), Degeneration of lumbar or lumbosacral intervertebral disc, Depressive disorder, not elsewhere classified, Diabetes (HCC) (11/03/2006), Diabetes mellitus, Frequent urination (05/08/2014), Gout (08/22/2013), History of endovascular stent graft for abdominal aortic aneurysm (AAA) (01/22/2016), Hyperlipidemia, Hypertension, Hypothyroidism, Iron  deficiency anemia (06/13/2017), Lower GI bleed (06/2010), Lumbar radiculopathy (05/15/2015), MENOPAUSAL DISORDER (10/29/2007), Myalgia (09/25/2013), Noncompliance with medications (02/26/2014), Obesity, unspecified, OTITIS MEDIA, SEROUS, CHRONIC (04/23/2007), Thoracic disc disease (11/19/2018), URTICARIA (09/23/2009), and Venous insufficiency (07/19/2017).  Encounter for well adult exam with abnormal findings Age and sex appropriate education and counseling updated with regular exercise and diet Referrals for preventative services - none needed Immunizations addressed - declines tdap Smoking counseling  - none needed Evidence for depression or other mood disorder - none significant Most recent labs reviewed. I  have personally reviewed and have noted: 1) the patient's medical and social history 2) The patient's current medications and supplements 3) The patient's height, weight, and BMI have been recorded in the chart   Diabetes [facility]) Lab Results  Component Value Date   HGBA1C 5.5 02/21/2023   Stable, pt to continue current medical treatment  - diet, wt control   Essential hypertension BP Readings from Last 3 Encounters:  02/21/23 122/76  02/21/23 110/72  11/07/22 128/78   Stable, pt to continue medical treatment tenormin  25 every day,    Hypothyroidism Lab Results  Component Value Date   TSH 0.04 (L) 02/21/2023   overcontrolled, pt to reduce levothyroxine  from 150 to 137 mcg qd   Cough With pain post fall, few RLL rales and appears ill today - can't r/o pna, for cxr, for levaqin 500 every day, and cough med prn  Right-sided chest pain Can't r/o fx -for rib films post fall, toradol  30 mg I'm today  Followup: Return in about 6 months (around 08/21/2023).  Lynwood Rush, MD 02/26/2023 9:31 AM [facility]

## 2023-02-22 ENCOUNTER — Other Ambulatory Visit: Payer: Self-pay | Admitting: Internal Medicine

## 2023-02-22 ENCOUNTER — Encounter: Payer: Self-pay | Admitting: Internal Medicine

## 2023-02-22 LAB — URINALYSIS, ROUTINE W REFLEX MICROSCOPIC
Bilirubin Urine: NEGATIVE
Hgb urine dipstick: NEGATIVE
Ketones, ur: NEGATIVE
Leukocytes,Ua: NEGATIVE
Nitrite: NEGATIVE
Specific Gravity, Urine: <=1.005 — AB (ref 1.000–1.030)
Total Protein, Urine: NEGATIVE
Urine Glucose: NEGATIVE
Urobilinogen, UA: 0.2 (ref 0.0–1.0)
pH: 6 (ref 5.0–8.0)

## 2023-02-22 MED ORDER — POTASSIUM CHLORIDE ER 10 MEQ PO TBCR: 10.0000 meq | EXTENDED_RELEASE_TABLET | Freq: Every day | ORAL | 3 refills | Status: DC

## 2023-02-22 MED ORDER — LEVOTHYROXINE SODIUM 137 MCG PO TABS: 137.0000 ug | ORAL_TABLET | Freq: Every day | ORAL | 3 refills | Status: DC

## 2023-02-26 ENCOUNTER — Encounter: Payer: Self-pay | Admitting: Internal Medicine

## 2023-02-26 DIAGNOSIS — R079 Chest pain, unspecified: Secondary | ICD-10-CM | POA: Insufficient documentation

## 2023-02-26 NOTE — Assessment & Plan Note (Signed)
 Lab Results  Component Value Date   TSH 0.04 (L) 02/21/2023   overcontrolled, pt to reduce levothyroxine  from 150 to 137 mcg qd

## 2023-02-26 NOTE — Assessment & Plan Note (Signed)
 Can't r/o fx -for rib films post fall, toradol  30 mg I'm today

## 2023-02-26 NOTE — Assessment & Plan Note (Signed)
 With pain post fall, few RLL rales and appears ill today - can't r/o pna, for cxr, for levaqin 500 every day, and cough med prn

## 2023-02-26 NOTE — Assessment & Plan Note (Signed)
 Lab Results  Component Value Date   HGBA1C 5.5 02/21/2023   Stable, pt to continue current medical treatment  - diet, wt control

## 2023-02-26 NOTE — Assessment & Plan Note (Signed)
 BP Readings from Last 3 Encounters:  02/21/23 122/76  02/21/23 110/72  11/07/22 128/78   Stable, pt to continue medical treatment tenormin  25 every day,

## 2023-02-26 NOTE — Assessment & Plan Note (Signed)
Age and sex appropriate education and counseling updated with regular exercise and diet Referrals for preventative services - none needed Immunizations addressed - declines tdap Smoking counseling  - none needed Evidence for depression or other mood disorder - none significant Most recent labs reviewed. I have personally reviewed and have noted: 1) the patient's medical and social history 2) The patient's current medications and supplements 3) The patient's height, weight, and BMI have been recorded in the chart  

## 2023-03-01 ENCOUNTER — Ambulatory Visit: Payer: Medicare PPO

## 2023-03-01 ENCOUNTER — Encounter: Payer: Self-pay | Admitting: *Deleted

## 2023-03-02 ENCOUNTER — Encounter: Payer: Self-pay | Admitting: Cardiovascular Disease

## 2023-03-02 ENCOUNTER — Ambulatory Visit: Payer: Medicare HMO | Attending: Cardiovascular Disease | Admitting: Cardiovascular Disease

## 2023-03-02 VITALS — BP 120/64 | HR 87 | Ht 66.0 in | Wt 176.2 lb

## 2023-03-02 DIAGNOSIS — I25119 Atherosclerotic heart disease of native coronary artery with unspecified angina pectoris: Secondary | ICD-10-CM

## 2023-03-02 DIAGNOSIS — I10 Essential (primary) hypertension: Secondary | ICD-10-CM | POA: Diagnosis not present

## 2023-03-02 DIAGNOSIS — I714 Abdominal aortic aneurysm, without rupture, unspecified: Secondary | ICD-10-CM | POA: Diagnosis not present

## 2023-03-02 DIAGNOSIS — I5032 Chronic diastolic (congestive) heart failure: Secondary | ICD-10-CM | POA: Diagnosis not present

## 2023-03-02 DIAGNOSIS — E782 Mixed hyperlipidemia: Secondary | ICD-10-CM | POA: Diagnosis not present

## 2023-03-02 MED ORDER — ISOSORBIDE MONONITRATE ER 30 MG PO TB24: 30.0000 mg | ORAL_TABLET | Freq: Every day | ORAL | 3 refills | Status: DC

## 2023-03-02 NOTE — Assessment & Plan Note (Signed)
Longstanding issues with compliance.  Reviewed Pharm.D. clinic notes from lipid clinic.  Currently on atorvastatin but I am not sure that she is taking it regularly.  Emphasized the importance of taking her medicines on a regular basis.  Last LDL cholesterol was 80.

## 2023-03-02 NOTE — Patient Instructions (Signed)
Medication Instructions:  Start Imdur 30 mg once daily *If you need a refill on your cardiac medications before your next appointment, please call your pharmacy*   Follow-Up: At [facility], you and your health needs are our priority.  As part of our continuing mission to provide you with exceptional heart care, we have created designated Provider Care Teams.  These Care Teams include your primary Cardiologist (physician) and Advanced Practice Providers (APPs -  Physician Assistants and Nurse Practitioners) who all work together to provide you with the care you need, when you need it.  We recommend signing up for the patient portal called "MyChart".  Sign up information is provided on this After Visit Summary.  MyChart is used to connect with patients for Virtual Visits (Telemedicine).  Patients are able to view lab/test results, encounter notes, upcoming appointments, etc.  Non-urgent messages can be sent to your provider as well.   To learn more about what you can do with MyChart, go to [email or web address].    Your next appointment:   As scheduled  Provider:   Robin Searing, NP  Other Instructions   1st Floor: - Lobby - Registration  - Pharmacy  - Lab - Cafe  2nd Floor: - PV Lab - Diagnostic Testing (echo, CT, nuclear med)  3rd Floor: - Vacant  4th Floor: - TCTS (cardiothoracic surgery) - AFib Clinic - Structural Heart Clinic - Vascular Surgery  - Vascular Ultrasound  5th Floor: - [facility] (general and EP) - Clinical Pharmacy for coumadin, hypertension, lipid, weight-loss medications, and med management appointments    Valet parking services will be available as well.

## 2023-03-02 NOTE — Assessment & Plan Note (Signed)
Clinically stable.  Continue current medical therapy.

## 2023-03-02 NOTE — Assessment & Plan Note (Signed)
Blood pressure well-controlled.  Continue atenolol.  Most recent labs reviewed with a creatinine of 0.6 and potassium of 3.2.  She only takes furosemide and potassium on an as-needed basis.

## 2023-03-02 NOTE — Progress Notes (Signed)
Cardiology Office Note:    Date:  03/02/2023   ID:  [name], DOB 06/08/1945, MRN [id number]  PCP:  Corwin Levins, MD   [facility] Providers Cardiologist:  Tonny Bollman, MD Cardiology APP:  Kennon Rounds     Referring MD: Corwin Levins, MD   Chief Complaint  Patient presents with   Chest Pain    History of Present Illness:    [name] is a 78 y.o. female with a hx of:  Coronary artery disease  s/p prior inf MI in 1998 tx with stent to the RCA S/p stent LCx in 2012 S/p stent to the LAD in 2015 S/p DES x 3 to the RCA 2017 Myoview 07/2018: low risk Cath 03/2019: dRCA diff dz'd supplying small/dz'd PDA+PL br (not ideal for PCI) >> med Rx (HFpEF) heart failure with preserved ejection fraction  Diabetes mellitus  Hypertension Hyperlipidemia AAA S/p EVAR 01/2016  The patient is here alone today.  She called in with right-sided chest pain and was added on.  Her last stress test in June 2024 showed normal findings with normal perfusion and gated LVEF of 62%.  The patient describes a central and right-sided chest discomfort that feels like an ache.  It occurs more often after eating specific foods and is relieved by belching.  However, she does not think this is related to acid reflux or heartburn symptoms.  It is felt similar to her previous anginal symptoms.  Her symptoms are relieved by 1 or 2 nitroglycerin and they are only occurring on an intermittent basis.  They are unrelated to physical exertion but she is not very active anymore.  She denies shortness of breath, orthopnea, PND, or leg swelling.  She describes some unsteadiness on her feet.  Current Medications: Current Meds  Medication Sig   allopurinol (ZYLOPRIM) 300 MG tablet Take 1 tablet (300 mg total) by mouth as needed (for Gout).   aspirin 81 MG EC tablet Take 81 mg by mouth daily.    atenolol (TENORMIN) 25 MG tablet TAKE 1 TABLET BY MOUTH EVERY DAY.   celecoxib (CELEBREX) 200 MG capsule  Take 1 capsule (200 mg total) by mouth 2 (two) times daily as needed for moderate pain.   Cholecalciferol (THERA-D 2000) 50 MCG (2000 UT) TABS 1 tab by mouth once daily (Patient taking differently: 1 tab by mouth once daily, prn)   fluconazole (DIFLUCAN) 150 MG tablet 1 tab by mouth every 3 days as needed   furosemide (LASIX) 40 MG tablet Take 1 tablet (40 mg total) by mouth daily. (Patient taking differently: Take 40 mg by mouth daily as needed for fluid.)   gabapentin (NEURONTIN) 300 MG capsule Take 1-2 tab by mouth at bedtime for pain and sleep (Patient taking differently: Take 300 mg by mouth as needed. Take 1-2 tab by mouth at bedtime for pain and sleep)   levofloxacin (LEVAQUIN) 500 MG tablet Take 1 tablet (500 mg total) by mouth daily.   levothyroxine (SYNTHROID) 137 MCG tablet Take 1 tablet (137 mcg total) by mouth daily before breakfast.   nitroGLYCERIN (NITROSTAT) 0.4 MG SL tablet Place 1 tablet (0.4 mg total) under the tongue every 5 (five) minutes as needed for chest pain.   oxyCODONE (OXYCONTIN) 10 mg 12 hr tablet Take 1 tablet (10 mg total) by mouth every 12 (twelve) hours. (Patient taking differently: Take 10 mg by mouth as needed.)   pantoprazole (PROTONIX) 40 MG tablet Take 40 mg by mouth  daily as needed (heartburn acid reflux).   potassium chloride (KLOR-CON) 10 MEQ tablet Take 1 tablet (10 mEq total) by mouth daily. (Patient taking differently: Take 10 mEq by mouth as needed.)   pregabalin (LYRICA) 75 MG capsule Take 75 mg by mouth daily as needed (pain).    tiZANidine (ZANAFLEX) 2 MG tablet Take 1 tablet (2 mg total) by mouth every 6 (six) hours as needed for muscle spasms.   traZODone (DESYREL) 50 MG tablet Take 0.5-1 tablets (25-50 mg total) by mouth at bedtime as needed for sleep.   triamcinolone (NASACORT) 55 MCG/ACT AERO nasal inhaler Place 2 sprays into the nose as needed (congestion).   Triamcinolone Acetonide (ZILRETTA) 32 MG SRER intra-articular injection 5 mL,  intra-articular, bilat knees   [DISCONTINUED] HYDROcodone bit-homatropine (HYCODAN) 5-1.5 MG/5ML syrup Take 5 mLs by mouth every 6 (six) hours as needed for up to 10 days.     Allergies:   Crestor [rosuvastatin calcium], Prilosec [omeprazole], Miconazole nitrate, Augmentin [amoxicillin-pot clavulanate], and Doxycycline   ROS:   Please see the history of present illness.    All other systems reviewed and are negative.  EKGs/Labs/Other Studies Reviewed:    The following studies were reviewed today: Cardiac Studies & Procedures   ______________________________________________________________________________________________ CARDIAC CATHETERIZATION  CARDIAC CATHETERIZATION 04/03/2019  Narrative Conclusions: 1. Multivessel coronary artery disease, as detailed below.  There may have been slight interval progression in the mid/distal RCA disease, though otherwise the angiographic appearance of the coronary arteries is similar to the most recent study in 10/2015. 2. Normal left ventricular systolic function with mildly elevated filling pressure.  Recommendations: 1. Images reviewed with interventional team.  We agree that the distal RCA is diffusely disease and supplies relatively small/diseased PDA and PL branches; this is not an ideal target for PCI.  Other disease, the most severe of which affects the small/distal branches, has not changed significantly since 2017. 2. Start isosorbide mononitrate 30 mg daily and wean nitroglycerin infusion, as tolerated.  Reserve PCI to RCA and/or LCx for refractory symptoms. 3. Aggressive secondary prevention, including high-intensity statin therapy.  Yvonne Kendall, MD [facility]  Findings Coronary Findings Diagnostic  Dominance: Right  Left Main Vessel is large. Vessel is angiographically normal.  Left Anterior Descending Vessel is large. Prox LAD to Mid LAD lesion is 30% stenosed. The lesion was previously treated using a stent (unknown  type) over 2 years ago. Previously placed stent displays restenosis. Dist LAD lesion is 75% stenosed.  First Diagonal Branch Vessel is small in size.  Second Diagonal Branch Vessel is moderate in size. 2nd Diag lesion is 75% stenosed.  Third Diagonal Branch Vessel is small in size.  Left Circumflex Prox Cx to Mid Cx lesion is 60% stenosed. The lesion is eccentric. Mid Cx to Dist Cx lesion is 60% stenosed.  First Obtuse Marginal Branch Vessel is small in size.  Second Obtuse Marginal Branch Vessel is large in size. Previously placed 2nd Mrg stent (unknown type) is widely patent.  Right Coronary Artery Vessel is moderate in size. Previously placed Ost RCA to Mid RCA stent (unknown type) is widely patent. Pressure wire/FFR was not performed on the lesion. Mid RCA to Dist RCA lesion is 70% stenosed. Dist RCA lesion is 55% stenosed.  Right Posterior Descending Artery Vessel is small in size. There is severe disease in the vessel.  Right Posterior Atrioventricular Artery Vessel is small in size.  Intervention  No interventions have been documented.   CARDIAC CATHETERIZATION  CARDIAC CATHETERIZATION 10/29/2015  Narrative Conclusions: 1. Multivessel coronary artery disease, including 60% ostial and 70% proximal/mid RCA disease that is hemodynamically significant by FFR (0.71).  Mild in-stent restenosis of LAD stents, as well as 60% stenosis in the mid LCx adjacent to previously stented large OM are also present. 2. Successful FFR-guided PCI of the RCA from the ostium to the midvessel with three overlapping Promus Premier stents post-dilated to 3.25 mm with 0% residual stenosis and TIMI-3 flow. 3. Patient had significant pain chest pain beginning with FFR and continuing throughout intervention despite aggressive sedation and analgesia.  No clear angiographic or electrocardiographic etiology was identified.  Pain had returned to pre-cath level by the end of the  procedure.  Recommendations: 1. Transfer to 2-Heart for aggressive medical management of chest pain, including weaning of NTG infusion as tolerated. 2. Dual antiplatelet therapy with aspirin and ticagrelor for at least 12 months, ideally longer. 3. Aggressive secondary prevention. 4. If patient continues to have chest pain, consider repeat catheterization with FFR and/or PCI to mid LCx. 5. Due to marked tortuosity of the innominate/right subclavian artery, consider alternate vascular access for further cardiac catheterizations.  Yvonne Kendall, MD [facility] Pager: [phone]  Findings Coronary Findings Diagnostic  Dominance: Right  Left Main Vessel is large.  Left Anterior Descending The lesion is irregular. The lesion was previously treatedover 2 years ago. Previously placed stent displays restenosis.  First Diagonal Branch Vessel is small in size.  Second Diagonal Branch Vessel is moderate in size.  Third Diagonal Branch Vessel is small in size.  Left Circumflex Vessel is large.  First Obtuse Marginal Branch Vessel is small in size.  Second Obtuse Marginal Branch Vessel is large in size. Previously placed Ost 2nd Mrg to 2nd Mrg bare metal stent is widely patent.  Third Obtuse Marginal Branch Vessel is moderate in size.  Right Coronary Artery Vessel is large.  Pressure wire/FFR was performed on the lesion. FFR: 0.71.  Right Posterior Descending Artery Vessel is moderate in size.  Right Posterior Atrioventricular Artery Vessel is small in size.  Intervention  Ost RCA lesion Angioplasty Lesion crossed with guidewire using a GUIDEWIRE COMET PRESSURE. Pre-stent angioplasty was performed using a BALLOON [place] MR 3.0X12. A STENT PROMUS PREM MR 3.0X12 drug eluting stent was successfully placed, and overlaps previously placed stent. Stent strut is well apposed. Post-stent angioplasty was performed using a BALLOON [place] EUPHORA [id number]. Maximum pressure:  16 atm. The pre-interventional distal flow is normal (TIMI 3).  The post-interventional distal flow is normal (TIMI 3). The intervention was successful . No complications occurred at this lesion. There is a 0% residual stenosis post intervention.  Ost RCA to Prox RCA lesion Angioplasty Lesion crossed with guidewire using a GUIDEWIRE COMET PRESSURE. Pre-stent angioplasty was performed using a BALLOON [place] MR 3.0X12. A STENT PROMUS PREM MR 3.0X16 drug eluting stent was successfully placed, and overlaps previously placed stent. Stent strut is well apposed. Post-stent angioplasty was performed using a BALLOON [place] EUPHORA [id number]. Maximum pressure: 14 atm. The pre-interventional distal flow is normal (TIMI 3).  The post-interventional distal flow is normal (TIMI 3). The intervention was successful . No complications occurred at this lesion. There is a 0% residual stenosis post intervention.  Prox RCA to Mid RCA lesion Angioplasty Lesion crossed with guidewire using a GUIDEWIRE COMET PRESSURE. Pre-stent angioplasty was performed. A STENT PROMUS PREM MR 3.0X28 drug eluting stent was successfully placed. Stent strut is well apposed. Post-stent angioplasty was performed using a BALLOON [place] EUPHORA [id number]. Maximum pressure:  14 atm. The pre-interventional distal flow is normal (TIMI 3).  The post-interventional distal flow is normal (TIMI 3). The intervention was successful . There is a 0% residual stenosis post intervention.   STRESS TESTS  MYOCARDIAL PERFUSION IMAGING 06/21/2022  Narrative   The study is normal. The study is low risk.   No ST deviation was noted.   LV perfusion is normal. There is no evidence of ischemia. There is no evidence of infarction.   Left ventricular function is normal. Nuclear stress EF: 62 %. The left ventricular ejection fraction is normal (55-65%). End diastolic cavity size is normal. End systolic cavity size is normal.   Prior study available for comparison from  08/17/2018.  Fixed perfusion defect at apex and apical inferior wall with normal wall motion, consistent with artifact Low risk study   ECHOCARDIOGRAM  ECHOCARDIOGRAM COMPLETE 02/03/2021  Narrative ECHOCARDIOGRAM REPORT    Patient Name:   BRANTLEIGH MIFFLIN Date of Exam: 02/03/2021 Medical Rec #:  [id number]      Height:       66.0 in Accession #:    [id number]     Weight:       194.2 lb Date of Birth:  1945/03/04       BSA:          1.975 m Patient Age:    75 years       BP:           138/80 mmHg Patient Gender: F              HR:           59 bpm. Exam Location:  [address]  Procedure: 3D Echo, 2D Echo, Cardiac Doppler, Color Doppler and Strain Analysis  Indications:    I50.43 CHF  History:        Patient has prior history of Echocardiogram examinations, most recent 02/09/2018. CAD, Signs/Symptoms:Edema; Risk Factors:Hypertension, Diabetes, Dyslipidemia and Former Smoker. PVD. AAA. Venous insufficiency. Obesity. Anemia.  Sonographer:    Jorje Guild BS, RDCS Referring Phys: [phone] Braley Luckenbaugh  IMPRESSIONS   1. Left ventricular ejection fraction, by estimation, is 55%. The left ventricle has normal function. The left ventricle demonstrates regional wall motion abnormalities with basal to mid inferior hypokinesis. Left ventricular diastolic parameters are consistent with Grade I diastolic dysfunction (impaired relaxation). 2. Right ventricular systolic function is normal. The right ventricular size is normal. Tricuspid regurgitation signal is inadequate for assessing PA pressure. 3. The mitral valve is normal in structure. No evidence of mitral valve regurgitation. No evidence of mitral stenosis. 4. The aortic valve is tricuspid. Aortic valve regurgitation is not visualized. Aortic valve sclerosis/calcification is present, without any evidence of aortic stenosis. 5. The inferior vena cava is normal in size with greater than 50% respiratory variability, suggesting right atrial  pressure of 3 mmHg.  Comparison(s): 02/09/18 EF 50-55%.  FINDINGS Left Ventricle: Left ventricular ejection fraction, by estimation, is 55%. The left ventricle has normal function. The left ventricle demonstrates regional wall motion abnormalities. The left ventricular internal cavity size was normal in size. There is no left ventricular hypertrophy. Left ventricular diastolic parameters are consistent with Grade I diastolic dysfunction (impaired relaxation).  Right Ventricle: The right ventricular size is normal. No increase in right ventricular wall thickness. Right ventricular systolic function is normal. Tricuspid regurgitation signal is inadequate for assessing PA pressure.  Left Atrium: Left atrial size was normal in size.  Right Atrium: Right atrial size was normal in size.  Pericardium: There is no evidence of pericardial effusion.  Mitral Valve: The mitral valve is normal in structure. Mild mitral annular calcification. No evidence of mitral valve regurgitation. No evidence of mitral valve stenosis.  Tricuspid Valve: The tricuspid valve is normal in structure. Tricuspid valve regurgitation is not demonstrated.  Aortic Valve: The aortic valve is tricuspid. Aortic valve regurgitation is not visualized. Aortic valve sclerosis/calcification is present, without any evidence of aortic stenosis.  Pulmonic Valve: The pulmonic valve was normal in structure. Pulmonic valve regurgitation is trivial.  Aorta: The aortic root is normal in size and structure.  Venous: The inferior vena cava is normal in size with greater than 50% respiratory variability, suggesting right atrial pressure of 3 mmHg.  IAS/Shunts: No atrial level shunt detected by color flow Doppler.   LEFT VENTRICLE PLAX 2D LVIDd:         5.00 cm   Diastology LVIDs:         3.30 cm   LV e' medial:    7.50 cm/s LV PW:         0.80 cm   LV E/e' medial:  8.1 LV IVS:        0.90 cm   LV e' lateral:   13.10 cm/s LVOT diam:      2.20 cm   LV E/e' lateral: 4.6 LV SV:         77 LV SV Index:   39        2D Longitudinal Strain LVOT Area:     3.80 cm  2D Strain GLS (A2C):   -17.7 % 2D Strain GLS (A3C):   -14.9 % 2D Strain GLS (A4C):   -18.6 % 2D Strain GLS Avg:     -17.1 %  3D Volume EF: 3D EF:        55 % LV EDV:       145 ml LV ESV:       65 ml LV SV:        81 ml  RIGHT VENTRICLE            IVC RV Basal diam:  3.70 cm    IVC diam: 1.30 cm RV S prime:     9.45 cm/s TAPSE (M-mode): 1.8 cm  LEFT ATRIUM             Index        RIGHT ATRIUM           Index LA diam:        3.10 cm 1.57 cm/m   RA Pressure: 3.00 mmHg LA Vol (A2C):   34.5 ml 17.47 ml/m  RA Area:     11.80 cm LA Vol (A4C):   24.3 ml 12.30 ml/m  RA Volume:   29.60 ml  14.99 ml/m LA Biplane Vol: 29.6 ml 14.99 ml/m AORTIC VALVE LVOT Vmax:   106.00 cm/s LVOT Vmean:  61.600 cm/s LVOT VTI:    0.203 m  AORTA Ao Root diam: 3.20 cm Ao Asc diam:  3.10 cm  MITRAL VALVE               TRICUSPID VALVE Estimated RAP:  3.00 mmHg MV Decel Time: 306 msec MV E velocity: 60.70 cm/s  SHUNTS MV A velocity: 92.30 cm/s  Systemic VTI:  0.20 m MV E/A ratio:  0.66        Systemic Diam: 2.20 cm  Dalton McleanMD Electronically signed by Wilfred Lacy Signature Date/Time: 02/03/2021/3:57:22 PM    Final  ______________________________________________________________________________________________      EKG:   EKG Interpretation Date/Time:  Thursday March 02 2023 09:43:37 EST Ventricular Rate:  87 PR Interval:  122 QRS Duration:  138 QT Interval:  388 QTC Calculation: 466 R Axis:   265  Text Interpretation: Normal sinus rhythm Right bundle branch block When compared with ECG of 21-Jun-2022 12:01, Nonspecific T wave abnormality has replaced inverted T waves in Inferior leads Confirmed by Tonny Bollman [phone]) on 03/02/2023 9:49:21 AM    Recent Labs: 02/21/2023: ALT 9; BUN 5; Creatinine, Ser 0.60; Hemoglobin 13.1; Platelets 352.0;  Potassium 3.2; Sodium 140; TSH 0.04  Recent Lipid Panel    Component Value Date/Time   CHOL 129 02/21/2023 1424   CHOL 165 10/05/2022 0818   TRIG 106.0 02/21/2023 1424   TRIG 102 01/02/2006 0825   HDL 27.60 (L) 02/21/2023 1424   HDL 33 (L) 10/05/2022 0818   CHOLHDL 5 02/21/2023 1424   VLDL 21.2 02/21/2023 1424   LDLCALC 80 02/21/2023 1424   LDLCALC 107 (H) 10/05/2022 0818   LDLDIRECT 131.0 03/27/2015 1137     Risk Assessment/Calculations:                Physical Exam:    VS:  BP 120/64   Pulse 87   Ht 5\' 6"  (1.676 m)   Wt 176 lb 3.2 oz (79.9 kg)   SpO2 97%   BMI 28.44 kg/m     Wt Readings from Last 3 Encounters:  03/02/23 176 lb 3.2 oz (79.9 kg)  02/21/23 175 lb (79.4 kg)  11/07/22 177 lb (80.3 kg)     GEN:  Well nourished, well developed in no acute distress HEENT: Normal NECK: No JVD; No carotid bruits LYMPHATICS: No lymphadenopathy CARDIAC: RRR, no murmurs, rubs, gallops RESPIRATORY:  Clear to auscultation without rales, wheezing or rhonchi  ABDOMEN: Soft, non-tender, non-distended MUSCULOSKELETAL:  No edema; No deformity  SKIN: Warm and dry NEUROLOGIC:  Alert and oriented x 3 PSYCHIATRIC:  Normal affect   Assessment & Plan Essential hypertension Blood pressure well-controlled.  Continue atenolol.  Most recent labs reviewed with a creatinine of 0.6 and potassium of 3.2.  She only takes furosemide and potassium on an as-needed basis. Coronary artery disease involving native coronary artery of native heart with angina pectoris [facility]) The patient reports some increased angina.  Seems to be nitroglycerin responsive.  I have asked her to start isosorbide 30 mg daily.  Isosorbide is on her medicine list but she tells me today that she is not taking it.  She is only using sublingual nitroglycerin.  She will continue on her beta-blocker and antiplatelet therapy with aspirin.  She remains on high intensity statin drug.  I reviewed her last cardiac catheterization  films which show moderately severe stenosis in the mid to distal RCA and severe stenosis in a diagonal branch of the LAD.  Medical therapy was recommended at that time she had her last catheterization in 2021.  If she has progressive symptoms of angina, she will probably need cardiac catheterization and we discussed this again.  Hopefully we can manage her medically. Abdominal aortic aneurysm (AAA) without rupture, unspecified part [facility]) Patient is status post EVAR.  Followed by vascular surgery. Chronic heart failure with preserved ejection fraction (HFpEF) (HCC) Clinically stable.  Continue current medical therapy. Mixed hyperlipidemia Longstanding issues with compliance.  Reviewed Pharm.D. clinic notes from lipid clinic.  Currently on atorvastatin but I am not sure that she is taking it regularly.  Emphasized the importance  of taking her medicines on a regular basis.  Last LDL cholesterol was 80.    Medication Adjustments/Labs and Tests Ordered: Current medicines are reviewed at length with the patient today.  Concerns regarding medicines are outlined above.  Orders Placed This Encounter  Procedures   EKG 12-Lead   Meds ordered this encounter  Medications   isosorbide mononitrate (IMDUR) 30 MG 24 hr tablet    Sig: Take 1 tablet (30 mg total) by mouth daily.    Dispense:  90 tablet    Refill:  3    Dose increase    Patient Instructions  Medication Instructions:  Start Imdur 30 mg once daily *If you need a refill on your cardiac medications before your next appointment, please call your pharmacy*   Follow-Up: At [facility], you and your health needs are our priority.  As part of our continuing mission to provide you with exceptional heart care, we have created designated Provider Care Teams.  These Care Teams include your primary Cardiologist (physician) and Advanced Practice Providers (APPs -  Physician Assistants and Nurse Practitioners) who all work together to provide  you with the care you need, when you need it.  We recommend signing up for the patient portal called "MyChart".  Sign up information is provided on this After Visit Summary.  MyChart is used to connect with patients for Virtual Visits (Telemedicine).  Patients are able to view lab/test results, encounter notes, upcoming appointments, etc.  Non-urgent messages can be sent to your provider as well.   To learn more about what you can do with MyChart, go to [email or web address].    Your next appointment:   As scheduled  Provider:   Robin Searing, NP  Other Instructions   1st Floor: - Lobby - Registration  - Pharmacy  - Lab - Cafe  2nd Floor: - PV Lab - Diagnostic Testing (echo, CT, nuclear med)  3rd Floor: - Vacant  4th Floor: - TCTS (cardiothoracic surgery) - AFib Clinic - Structural Heart Clinic - Vascular Surgery  - Vascular Ultrasound  5th Floor: - HeartCare Cardiology (general and EP) - Clinical Pharmacy for coumadin, hypertension, lipid, weight-loss medications, and med management appointments    Valet parking services will be available as well.        Signed, Tonny Bollman, MD  03/02/2023 5:05 PM    [facility]

## 2023-03-02 NOTE — Assessment & Plan Note (Signed)
Patient is status post EVAR.  Followed by vascular surgery.

## 2023-03-02 NOTE — Assessment & Plan Note (Signed)
The patient reports some increased angina.  Seems to be nitroglycerin responsive.  I have asked her to start isosorbide 30 mg daily.  Isosorbide is on her medicine list but she tells me today that she is not taking it.  She is only using sublingual nitroglycerin.  She will continue on her beta-blocker and antiplatelet therapy with aspirin.  She remains on high intensity statin drug.  I reviewed her last cardiac catheterization films which show moderately severe stenosis in the mid to distal RCA and severe stenosis in a diagonal branch of the LAD.  Medical therapy was recommended at that time she had her last catheterization in 2021.  If she has progressive symptoms of angina, she will probably need cardiac catheterization and we discussed this again.  Hopefully we can manage her medically.

## 2023-03-08 ENCOUNTER — Telehealth: Payer: Self-pay

## 2023-03-08 ENCOUNTER — Other Ambulatory Visit: Payer: Self-pay | Admitting: Internal Medicine

## 2023-03-08 ENCOUNTER — Encounter: Payer: Self-pay | Admitting: Internal Medicine

## 2023-03-08 DIAGNOSIS — J984 Other disorders of lung: Secondary | ICD-10-CM

## 2023-03-08 NOTE — Telephone Encounter (Signed)
Ok this has been addressed

## 2023-03-08 NOTE — Telephone Encounter (Signed)
Call Report for recent 02/21/23 imaging below:  >> Mar 08, 2023 11:09 AM Kathryne Eriksson wrote: CALL REPORT: Patient's Chest - Peripheral Right Upper Lobe Opacity Which May Be Cavitary, May Be Infection Or Neoplasin.[name] Recommend CT Chest   *can be viewed in patient chart

## 2023-03-09 ENCOUNTER — Encounter (HOSPITAL_BASED_OUTPATIENT_CLINIC_OR_DEPARTMENT_OTHER): Payer: Self-pay

## 2023-03-15 ENCOUNTER — Ambulatory Visit
Admission: RE | Admit: 2023-03-15 | Discharge: 2023-03-15 | Disposition: A | Discharge status: Home / Self Care | Payer: Medicare HMO | Source: Ambulatory Visit | Attending: Internal Medicine | Admitting: Internal Medicine

## 2023-03-15 DIAGNOSIS — Z1231 Encounter for screening mammogram for malignant neoplasm of breast: Secondary | ICD-10-CM

## 2023-03-20 ENCOUNTER — Other Ambulatory Visit: Payer: Self-pay | Admitting: Internal Medicine

## 2023-03-20 ENCOUNTER — Encounter: Payer: Self-pay | Admitting: Internal Medicine

## 2023-03-20 ENCOUNTER — Ambulatory Visit
Admission: RE | Admit: 2023-03-20 | Discharge: 2023-03-20 | Disposition: A | Discharge status: Home / Self Care | Source: Ambulatory Visit | Attending: Internal Medicine | Admitting: Internal Medicine

## 2023-03-20 DIAGNOSIS — J984 Other disorders of lung: Secondary | ICD-10-CM

## 2023-03-20 DIAGNOSIS — I739 Peripheral vascular disease, unspecified: Secondary | ICD-10-CM | POA: Diagnosis not present

## 2023-03-20 DIAGNOSIS — I700 Atherosclerosis of aorta: Secondary | ICD-10-CM | POA: Diagnosis not present

## 2023-03-20 DIAGNOSIS — K5730 Diverticulosis of large intestine without perforation or abscess without bleeding: Secondary | ICD-10-CM | POA: Diagnosis not present

## 2023-03-20 DIAGNOSIS — R918 Other nonspecific abnormal finding of lung field: Secondary | ICD-10-CM | POA: Diagnosis not present

## 2023-03-20 DIAGNOSIS — I10 Essential (primary) hypertension: Secondary | ICD-10-CM | POA: Diagnosis not present

## 2023-03-20 MED ORDER — SULFAMETHOXAZOLE-TRIMETHOPRIM 800-160 MG PO TABS: 1.0000 | ORAL_TABLET | Freq: Two times a day (BID) | ORAL | 0 refills | Status: DC

## 2023-03-20 MED ORDER — IOPAMIDOL (ISOVUE-300) INJECTION 61%
100.0000 mL | Freq: Once | INTRAVENOUS | Status: AC | PRN
  Administered 2023-03-20: 75 mL via INTRAVENOUS

## 2023-03-21 ENCOUNTER — Other Ambulatory Visit: Payer: Self-pay

## 2023-03-21 ENCOUNTER — Encounter (HOSPITAL_COMMUNITY): Payer: Self-pay | Admitting: Radiology

## 2023-03-21 ENCOUNTER — Inpatient Hospital Stay (HOSPITAL_COMMUNITY)
Admission: EM | Admit: 2023-03-21 | Discharge: 2023-03-27 | DRG: 181 | Disposition: A | Discharge status: Home / Self Care | Attending: Internal Medicine | Admitting: Internal Medicine

## 2023-03-21 ENCOUNTER — Emergency Department (HOSPITAL_COMMUNITY)

## 2023-03-21 DIAGNOSIS — Z7982 Long term (current) use of aspirin: Secondary | ICD-10-CM

## 2023-03-21 DIAGNOSIS — Z881 Allergy status to other antibiotic agents status: Secondary | ICD-10-CM

## 2023-03-21 DIAGNOSIS — Z8679 Personal history of other diseases of the circulatory system: Secondary | ICD-10-CM | POA: Diagnosis not present

## 2023-03-21 DIAGNOSIS — I700 Atherosclerosis of aorta: Secondary | ICD-10-CM | POA: Diagnosis present

## 2023-03-21 DIAGNOSIS — J189 Pneumonia, unspecified organism: Secondary | ICD-10-CM

## 2023-03-21 DIAGNOSIS — I5032 Chronic diastolic (congestive) heart failure: Secondary | ICD-10-CM | POA: Diagnosis not present

## 2023-03-21 DIAGNOSIS — R0789 Other chest pain: Secondary | ICD-10-CM | POA: Diagnosis not present

## 2023-03-21 DIAGNOSIS — I110 Hypertensive heart disease with heart failure: Secondary | ICD-10-CM | POA: Diagnosis present

## 2023-03-21 DIAGNOSIS — I252 Old myocardial infarction: Secondary | ICD-10-CM

## 2023-03-21 DIAGNOSIS — Z95828 Presence of other vascular implants and grafts: Secondary | ICD-10-CM | POA: Diagnosis not present

## 2023-03-21 DIAGNOSIS — E669 Obesity, unspecified: Secondary | ICD-10-CM | POA: Diagnosis not present

## 2023-03-21 DIAGNOSIS — R079 Chest pain, unspecified: Secondary | ICD-10-CM | POA: Diagnosis not present

## 2023-03-21 DIAGNOSIS — Z8249 Family history of ischemic heart disease and other diseases of the circulatory system: Secondary | ICD-10-CM

## 2023-03-21 DIAGNOSIS — E785 Hyperlipidemia, unspecified: Secondary | ICD-10-CM

## 2023-03-21 DIAGNOSIS — H9193 Unspecified hearing loss, bilateral: Secondary | ICD-10-CM | POA: Diagnosis present

## 2023-03-21 DIAGNOSIS — Z9689 Presence of other specified functional implants: Secondary | ICD-10-CM | POA: Diagnosis present

## 2023-03-21 DIAGNOSIS — E890 Postprocedural hypothyroidism: Secondary | ICD-10-CM | POA: Diagnosis present

## 2023-03-21 DIAGNOSIS — Z888 Allergy status to other drugs, medicaments and biological substances status: Secondary | ICD-10-CM | POA: Diagnosis not present

## 2023-03-21 DIAGNOSIS — Z833 Family history of diabetes mellitus: Secondary | ICD-10-CM | POA: Diagnosis not present

## 2023-03-21 DIAGNOSIS — I2510 Atherosclerotic heart disease of native coronary artery without angina pectoris: Secondary | ICD-10-CM | POA: Diagnosis present

## 2023-03-21 DIAGNOSIS — E039 Hypothyroidism, unspecified: Secondary | ICD-10-CM | POA: Diagnosis not present

## 2023-03-21 DIAGNOSIS — E782 Mixed hyperlipidemia: Secondary | ICD-10-CM | POA: Diagnosis present

## 2023-03-21 DIAGNOSIS — E1165 Type 2 diabetes mellitus with hyperglycemia: Secondary | ICD-10-CM | POA: Diagnosis not present

## 2023-03-21 DIAGNOSIS — Z79890 Hormone replacement therapy (postmenopausal): Secondary | ICD-10-CM

## 2023-03-21 DIAGNOSIS — R9389 Abnormal findings on diagnostic imaging of other specified body structures: Secondary | ICD-10-CM | POA: Diagnosis not present

## 2023-03-21 DIAGNOSIS — Z880 Allergy status to penicillin: Secondary | ICD-10-CM

## 2023-03-21 DIAGNOSIS — I25119 Atherosclerotic heart disease of native coronary artery with unspecified angina pectoris: Secondary | ICD-10-CM | POA: Diagnosis not present

## 2023-03-21 DIAGNOSIS — I959 Hypotension, unspecified: Secondary | ICD-10-CM | POA: Diagnosis not present

## 2023-03-21 DIAGNOSIS — E876 Hypokalemia: Secondary | ICD-10-CM | POA: Diagnosis present

## 2023-03-21 DIAGNOSIS — C3491 Malignant neoplasm of unspecified part of right bronchus or lung: Secondary | ICD-10-CM | POA: Diagnosis not present

## 2023-03-21 DIAGNOSIS — Z01818 Encounter for other preprocedural examination: Secondary | ICD-10-CM

## 2023-03-21 DIAGNOSIS — J168 Pneumonia due to other specified infectious organisms: Secondary | ICD-10-CM | POA: Diagnosis not present

## 2023-03-21 DIAGNOSIS — Z79899 Other long term (current) drug therapy: Secondary | ICD-10-CM

## 2023-03-21 DIAGNOSIS — R918 Other nonspecific abnormal finding of lung field: Principal | ICD-10-CM

## 2023-03-21 DIAGNOSIS — J948 Other specified pleural conditions: Secondary | ICD-10-CM | POA: Diagnosis not present

## 2023-03-21 DIAGNOSIS — I4510 Unspecified right bundle-branch block: Secondary | ICD-10-CM | POA: Diagnosis present

## 2023-03-21 DIAGNOSIS — Z90710 Acquired absence of both cervix and uterus: Secondary | ICD-10-CM

## 2023-03-21 DIAGNOSIS — Z803 Family history of malignant neoplasm of breast: Secondary | ICD-10-CM | POA: Diagnosis not present

## 2023-03-21 DIAGNOSIS — Z6828 Body mass index (BMI) 28.0-28.9, adult: Secondary | ICD-10-CM

## 2023-03-21 DIAGNOSIS — Z87891 Personal history of nicotine dependence: Secondary | ICD-10-CM

## 2023-03-21 DIAGNOSIS — E119 Type 2 diabetes mellitus without complications: Secondary | ICD-10-CM | POA: Diagnosis present

## 2023-03-21 DIAGNOSIS — Z8261 Family history of arthritis: Secondary | ICD-10-CM

## 2023-03-21 DIAGNOSIS — I10 Essential (primary) hypertension: Secondary | ICD-10-CM | POA: Diagnosis present

## 2023-03-21 DIAGNOSIS — Z955 Presence of coronary angioplasty implant and graft: Secondary | ICD-10-CM | POA: Diagnosis not present

## 2023-03-21 DIAGNOSIS — I714 Abdominal aortic aneurysm, without rupture, unspecified: Secondary | ICD-10-CM | POA: Diagnosis present

## 2023-03-21 DIAGNOSIS — Z9889 Other specified postprocedural states: Secondary | ICD-10-CM | POA: Diagnosis not present

## 2023-03-21 DIAGNOSIS — R202 Paresthesia of skin: Secondary | ICD-10-CM | POA: Diagnosis present

## 2023-03-21 LAB — BASIC METABOLIC PANEL
Anion gap: 14 (ref 5–15)
BUN: <5 mg/dL — ABNORMAL LOW (ref 8–23)
CO2: 25 mmol/L (ref 22–32)
Calcium: 9 mg/dL (ref 8.9–10.3)
Chloride: 102 mmol/L (ref 98–111)
Creatinine, Ser: 0.64 mg/dL (ref 0.44–1.00)
GFR, Estimated: >60 mL/min (ref >60)
Glucose, Bld: 78 mg/dL (ref 70–99)
Potassium: 3.3 mmol/L — ABNORMAL LOW (ref 3.5–5.1)
Sodium: 141 mmol/L (ref 135–145)

## 2023-03-21 LAB — CBC
HCT: 46.1 % — ABNORMAL HIGH (ref 36.0–46.0)
Hemoglobin: 14.2 g/dL (ref 12.0–15.0)
MCH: 27.2 pg (ref 26.0–34.0)
MCHC: 30.8 g/dL (ref 30.0–36.0)
MCV: 88.3 fL (ref 80.0–100.0)
Platelets: 306 10*3/uL (ref 150–400)
RBC: 5.22 MIL/uL — ABNORMAL HIGH (ref 3.87–5.11)
RDW: 15.2 % (ref 11.5–15.5)
WBC: 8.7 10*3/uL (ref 4.0–10.5)
nRBC: 0 % (ref 0.0–0.2)

## 2023-03-21 LAB — TROPONIN I (HIGH SENSITIVITY)
Troponin I (High Sensitivity): 7 ng/L (ref <18)
Troponin I (High Sensitivity): 5 ng/L (ref <18)

## 2023-03-21 LAB — LACTIC ACID, PLASMA: Lactic Acid, Venous: 1 mmol/L (ref 0.5–1.9)

## 2023-03-21 MED ORDER — HYDROCODONE-ACETAMINOPHEN 5-325 MG PO TABS
1.0000 | ORAL_TABLET | Freq: Once | ORAL | Status: AC
  Administered 2023-03-22: 1 via ORAL
  Filled 2023-03-21: qty 1

## 2023-03-21 MED ORDER — SODIUM CHLORIDE 0.9 % IV SOLN
500.0000 mg | Freq: Once | INTRAVENOUS | Status: AC
  Administered 2023-03-21: 500 mg via INTRAVENOUS
  Filled 2023-03-21: qty 5

## 2023-03-21 MED ORDER — ONDANSETRON HCL 4 MG/2ML IJ SOLN
4.0000 mg | Freq: Once | INTRAMUSCULAR | Status: AC
  Administered 2023-03-21: 4 mg via INTRAVENOUS
  Filled 2023-03-21: qty 2

## 2023-03-21 MED ORDER — MORPHINE SULFATE (PF) 2 MG/ML IV SOLN
2.0000 mg | Freq: Once | INTRAVENOUS | Status: AC
  Administered 2023-03-21: 2 mg via INTRAVENOUS
  Filled 2023-03-21: qty 1

## 2023-03-21 MED ORDER — SODIUM CHLORIDE 0.9 % IV SOLN
2.0000 g | Freq: Once | INTRAVENOUS | Status: AC
  Administered 2023-03-21: 2 g via INTRAVENOUS
  Filled 2023-03-21: qty 20

## 2023-03-21 NOTE — ED Triage Notes (Signed)
 Pt began having chest pain this morning and has been off and on. Pt states when the pain is there it is tight stabbing in the center of her chest. Pt took 2 nitroglycerin without relief prior to ems arrival. PT BP 90s on arrival. PT does have a cardiac history with stents.   108/52 98% 74 324mg  ASA 0.4mg  nitroglycerin x2

## 2023-03-21 NOTE — ED Provider Notes (Signed)
 [place] [facility] AT [facility] Provider Note   CSN: [id number] Arrival date & time: 03/21/23  1730     History  Chief Complaint  Patient presents with   Chest Pain    [name] is a 78 y.o. female.  78 year old female with past medical history of coronary artery disease and neuropathy presenting to the emergency department today with chest pain.  The patient states that she has been having chest pressure on and off in the center of her chest rating to the right has been going on all day today.  She is reports mild nausea but denies any vomiting.  She denies any fevers, chills, or cough.  She reports that this did get better with nitroglycerin.  Reported remote history of MI.  She denies a history of DVT or pulmonary embolism, recent surgeries, recent travel.   Chest Pain      Home Medications Prior to Admission medications   Medication Sig Start Date End Date Taking? Authorizing Provider  aspirin 81 MG EC tablet Take 81 mg by mouth daily.    Yes [provider]  atenolol (TENORMIN) 25 MG tablet TAKE 1 TABLET BY MOUTH EVERY DAY. 01/20/23  Yes Corwin Levins, MD  gabapentin (NEURONTIN) 300 MG capsule Take 1-2 tab by mouth at bedtime for pain and sleep Patient taking differently: Take 300 mg by mouth as needed. Take 1-2 tab by mouth at bedtime for pain and sleep 04/22/21  Yes Corwin Levins, MD  levothyroxine (SYNTHROID) 137 MCG tablet Take 1 tablet (137 mcg total) by mouth daily before breakfast. 02/22/23  Yes Corwin Levins, MD  Triamcinolone Acetonide (ZILRETTA) 32 MG SRER intra-articular injection 5 mL, intra-articular, bilat knees 01/27/23  Yes Rodolph Bong, MD  allopurinol (ZYLOPRIM) 300 MG tablet Take 1 tablet (300 mg total) by mouth as needed (for Gout). Patient not taking: Reported on 03/21/2023 10/26/20   Corwin Levins, MD  atorvastatin (LIPITOR) 40 MG tablet Take 1 tablet (40 mg total) by mouth daily. Patient not taking: Reported on 03/21/2023  10/07/22 01/05/23  Tonny Bollman, MD  furosemide (LASIX) 40 MG tablet Take 1 tablet (40 mg total) by mouth daily. Patient not taking: Reported on 03/21/2023 04/22/21   Corwin Levins, MD  nitroGLYCERIN (NITROSTAT) 0.4 MG SL tablet Place 1 tablet (0.4 mg total) under the tongue every 5 (five) minutes as needed for chest pain. Patient not taking: Reported on 03/21/2023 01/20/23   Tonny Bollman, MD  potassium chloride (KLOR-CON) 10 MEQ tablet Take 1 tablet (10 mEq total) by mouth daily. Patient not taking: Reported on 03/21/2023 02/22/23   Corwin Levins, MD  sulfamethoxazole-trimethoprim (BACTRIM DS) 800-160 MG tablet Take 1 tablet by mouth 2 (two) times daily. Patient not taking: Reported on 03/21/2023 03/20/23   Corwin Levins, MD      Allergies    Crestor [rosuvastatin calcium], Prilosec [omeprazole], Miconazole nitrate, Augmentin [amoxicillin-pot clavulanate], and Doxycycline    Review of Systems   Review of Systems  Cardiovascular:  Positive for chest pain.  All other systems reviewed and are negative.   Physical Exam Updated Vital Signs BP 109/65   Pulse 86   Temp 99 F (37.2 C)   Resp (!) 22   Ht 5\' 6"  (1.676 m)   Wt 79.8 kg   SpO2 96%   BMI 28.41 kg/m  Physical Exam Vitals and nursing note reviewed.   Gen: NAD Eyes: PERRL, EOMI HEENT: no oropharyngeal swelling Neck: trachea  midline Resp: Diminished at right lung base Card: RRR, no murmurs, rubs, or gallops Abd: nontender, nondistended Extremities: no calf tenderness, no edema Vascular: 2+ radial pulses bilaterally, 2+ DP pulses bilaterally Skin: no rashes Psyc: acting appropriately   ED Results / Procedures / Treatments   Labs (all labs ordered are listed, but only abnormal results are displayed) Labs Reviewed  BASIC METABOLIC PANEL - Abnormal; Notable for the following components:      Result Value   Potassium 3.3 (*)    BUN <5 (*)    All other components within normal limits  CBC - Abnormal; Notable for the following  components:   RBC 5.22 (*)    HCT 46.1 (*)    All other components within normal limits  CULTURE, BLOOD (ROUTINE X 2)  CULTURE, BLOOD (ROUTINE X 2)  LACTIC ACID, PLASMA  LACTIC ACID, PLASMA  TROPONIN I (HIGH SENSITIVITY)  TROPONIN I (HIGH SENSITIVITY)    EKG None  Radiology DG Chest 2 View Result Date: 03/21/2023 CLINICAL DATA:  Intermittent chest pain EXAM: CHEST - 2 VIEW COMPARISON:  CT chest dated 03/20/2023, chest radiograph dated 02/21/2023 FINDINGS: Patient is rotated to the right. Normal lung volumes. Superolateral right lung opacity, better evaluated on recent chest CT. Minimal adjacent interstitial opacities. No pleural effusion or pneumothorax. The heart size and mediastinal contours are within normal limits. No acute osseous abnormality. Partially imaged thoracolumbar spinal fixation hardware appears intact. Partially imaged aortic stent graft. IMPRESSION: Superolateral right lung opacity, better evaluated on recent chest CT. No new focal consolidations. Electronically Signed   By: Agustin Cree M.D.   On: 03/21/2023 19:57   CT Chest W Contrast Result Date: 03/20/2023 CLINICAL DATA:  Peripheral right upper lobe opacity which may be cavitary seen on chest radiographs dated 02/21/2023. Chest CT was recommended for further evaluation. EXAM: CT CHEST WITH CONTRAST TECHNIQUE: Multidetector CT imaging of the chest was performed during intravenous contrast administration. RADIATION DOSE REDUCTION: This exam was performed according to the departmental dose-optimization program which includes automated exposure control, adjustment of the mA and/or kV according to patient size and/or use of iterative reconstruction technique. CONTRAST:  75mL ISOVUE-300 IOPAMIDOL (ISOVUE-300) INJECTION 61% COMPARISON:  Chest radiographs dated 02/21/2023 and 04/03/2019. Chest CTA dated 06/11/2017. FINDINGS: Cardiovascular: Atheromatous calcifications, including the coronary arteries and aorta. Normal-sized heart. No  pericardial effusion. Mediastinum/Nodes: Surgically absent left lobe thyroid and isthmus. Normal-appearing right lobe. Mildly enlarged right anterior paratracheal/precarinal node with a short axis diameter of 14 mm on image number 135/10 without significant change since 06/11/2017. No interval enlarged lymph nodes elsewhere. Unremarkable trachea and esophagus. Lungs/Pleura: Irregular, pleural-based mass laterally in the right upper chest. This contains a minimal air or gas-filled cavitary component and is predominantly low-density centrally, possibly necrotic or filled with fluid. This is difficult to assess due to extensive streak artifact from spinal hardware. This area measures 8.2 x 2.6 cm on image number 89/10. This measures 7.1 cm in length on coronal image number 97/4. No pleural fluid elsewhere on either side. There are patchy, linear and interstitial densities in the right upper lobe adjacent to and inferior to the pleural-based mass. Clear left lung.  No lung nodules seen. Upper Abdomen: Extensive streak artifacts from spine fixation hardware. Multiple small colonic diverticula. Oval area of low density in the left lobe of the liver measuring 4.2 x 3.3 cm on image number 297/10. This measures 19 Hounsfield units in density, including increased density associated with streak artifacts. This measured 2.7 x 2.1 cm  and -4 Hounsfield units in density on 02/24/2017, compatible with a simple cyst, not requiring imaging follow-up. Musculoskeletal: Pedicle screw and rod fixation throughout the thoracic and lumbar spine from the upper thoracic spine down with associated extensive streak artifacts. Extensive thoracic and lower cervical spine degenerative changes. Grade 1 anterolisthesis in the lower thoracic spine with pedicle screw tracts compatible with removed screws. IMPRESSION: 1. 8.2 x 2.6 x 7.1 cm irregular, pleural-based mass laterally in the right upper chest with a minimal air or gas-filled cavitary  component and predominantly low-density centrally, possibly necrotic or filled with fluid. This is difficult to assess due to extensive streak artifacts from spinal hardware. This could represent a necrotic malignancy or an infectious process with a empyema. 2. Patchy, linear and interstitial densities in the right upper lobe adjacent to and inferior to the pleural-based mass, likely due to infection. This is more extensive and pronounced than expected with lymphangitic spread of tumor. 3. Stable mildly enlarged right anterior paratracheal/precarinal lymph node since 06/11/2017, compatible with a benign lymph node. 4.  Calcific coronary artery and aortic atherosclerosis. 5. Colonic diverticulosis. Aortic Atherosclerosis (ICD10-I70.0). Electronically Signed   By: Beckie Salts M.D.   On: 03/20/2023 17:37    Procedures Procedures    Medications Ordered in ED Medications  HYDROcodone-acetaminophen (NORCO/VICODIN) 5-325 MG per tablet 1 tablet (has no administration in time range)  cefTRIAXone (ROCEPHIN) 2 g in sodium chloride 0.9 % 100 mL IVPB (0 g Intravenous Stopped 03/21/23 2150)  azithromycin (ZITHROMAX) 500 mg in sodium chloride 0.9 % 250 mL IVPB (500 mg Intravenous New Bag/Given 03/21/23 2239)  morphine (PF) 2 MG/ML injection 2 mg (2 mg Intravenous Given 03/21/23 2114)  ondansetron (ZOFRAN) injection 4 mg (4 mg Intravenous Given 03/21/23 2114)    ED Course/ Medical Decision Making/ A&P                                 Medical Decision Making 78 year old female with past medical history of coronary artery disease presenting to the emergency department today with chest pain.  I will further evaluate the patient here with basic labs Wels and EKG, chest x-ray, and troponin for further evaluation for ACS, pulmonary edema, pulmonary blood traits, pneumothorax.  Acute patient on the cardiac monitor here.  Her blood pressures on arrival are low but this is after receiving nitroglycerin.  The patient did have  a CT scan performed yesterday that showed questionable malignancy versus infectious etiology.  Given her low blood pressure in light of this a lactic acid and blood cultures are ordered.  Patient is covered empirically with IV antibiotics.  I did call and discussed the patient's case with our ICU team.  They recommend admission for IV antibiotics and will consult on the patient feel that she would likely require a biopsy for further evaluation.  She is admitted for further evaluation management.  Amount and/or Complexity of Data Reviewed Labs: ordered. Radiology: ordered.  Risk Prescription drug management. Decision regarding hospitalization.           Final Clinical Impression(s) / ED Diagnoses Final diagnoses:  Lung mass  Pneumonia due to infectious organism, unspecified laterality, unspecified part of lung    Rx / DC Orders ED Discharge Orders     None         Durwin Glaze, MD 03/21/23 2349

## 2023-03-21 NOTE — ED Notes (Signed)
 This nurse attempted Ivx2

## 2023-03-22 ENCOUNTER — Other Ambulatory Visit (HOSPITAL_COMMUNITY)

## 2023-03-22 ENCOUNTER — Encounter (HOSPITAL_COMMUNITY): Payer: Self-pay | Admitting: Internal Medicine

## 2023-03-22 ENCOUNTER — Inpatient Hospital Stay (HOSPITAL_COMMUNITY)

## 2023-03-22 DIAGNOSIS — R918 Other nonspecific abnormal finding of lung field: Principal | ICD-10-CM

## 2023-03-22 DIAGNOSIS — E876 Hypokalemia: Secondary | ICD-10-CM

## 2023-03-22 DIAGNOSIS — I25119 Atherosclerotic heart disease of native coronary artery with unspecified angina pectoris: Secondary | ICD-10-CM

## 2023-03-22 DIAGNOSIS — R079 Chest pain, unspecified: Secondary | ICD-10-CM

## 2023-03-22 DIAGNOSIS — E1165 Type 2 diabetes mellitus with hyperglycemia: Secondary | ICD-10-CM

## 2023-03-22 DIAGNOSIS — E785 Hyperlipidemia, unspecified: Secondary | ICD-10-CM | POA: Diagnosis not present

## 2023-03-22 DIAGNOSIS — R9389 Abnormal findings on diagnostic imaging of other specified body structures: Secondary | ICD-10-CM | POA: Diagnosis not present

## 2023-03-22 DIAGNOSIS — J189 Pneumonia, unspecified organism: Secondary | ICD-10-CM | POA: Diagnosis not present

## 2023-03-22 DIAGNOSIS — E039 Hypothyroidism, unspecified: Secondary | ICD-10-CM

## 2023-03-22 LAB — HIV ANTIBODY (ROUTINE TESTING W REFLEX): HIV Screen 4th Generation wRfx: NONREACTIVE

## 2023-03-22 LAB — ECHOCARDIOGRAM COMPLETE
AR max vel: 2.31 cm2
AV Area VTI: 2.31 cm2
AV Area mean vel: 2.36 cm2
AV Mean grad: 4 mmHg
AV Peak grad: 6.7 mmHg
Ao pk vel: 1.29 m/s
Area-P 1/2: 3.1 cm2
Height: 66 in
MV VTI: 1.92 cm2
S' Lateral: 3.1 cm
Single Plane A4C EF: 53.8 %
Weight: 2816 [oz_av]

## 2023-03-22 LAB — BASIC METABOLIC PANEL
Anion gap: 6 (ref 5–15)
BUN: <5 mg/dL — ABNORMAL LOW (ref 8–23)
CO2: 28 mmol/L (ref 22–32)
Calcium: 8.1 mg/dL — ABNORMAL LOW (ref 8.9–10.3)
Chloride: 105 mmol/L (ref 98–111)
Creatinine, Ser: 0.79 mg/dL (ref 0.44–1.00)
GFR, Estimated: >60 mL/min (ref >60)
Glucose, Bld: 124 mg/dL — ABNORMAL HIGH (ref 70–99)
Potassium: 3 mmol/L — ABNORMAL LOW (ref 3.5–5.1)
Sodium: 139 mmol/L (ref 135–145)

## 2023-03-22 LAB — CBC WITH DIFFERENTIAL/PLATELET
Abs Immature Granulocytes: 0.05 10*3/uL (ref 0.00–0.07)
Basophils Absolute: 0 10*3/uL (ref 0.0–0.1)
Basophils Relative: 0 %
Eosinophils Absolute: 0.2 10*3/uL (ref 0.0–0.5)
Eosinophils Relative: 2 %
HCT: 38.5 % (ref 36.0–46.0)
Hemoglobin: 12.1 g/dL (ref 12.0–15.0)
Immature Granulocytes: 1 %
Lymphocytes Relative: 24 %
Lymphs Abs: 2.2 10*3/uL (ref 0.7–4.0)
MCH: 27.6 pg (ref 26.0–34.0)
MCHC: 31.4 g/dL (ref 30.0–36.0)
MCV: 87.9 fL (ref 80.0–100.0)
Monocytes Absolute: 0.8 10*3/uL (ref 0.1–1.0)
Monocytes Relative: 9 %
Neutro Abs: 5.8 10*3/uL (ref 1.7–7.7)
Neutrophils Relative %: 64 %
Platelets: 284 10*3/uL (ref 150–400)
RBC: 4.38 MIL/uL (ref 3.87–5.11)
RDW: 15.1 % (ref 11.5–15.5)
WBC: 9 10*3/uL (ref 4.0–10.5)
nRBC: 0 % (ref 0.0–0.2)

## 2023-03-22 LAB — TSH: TSH: 0.067 u[IU]/mL — ABNORMAL LOW (ref 0.350–4.500)

## 2023-03-22 LAB — LACTIC ACID, PLASMA: Lactic Acid, Venous: 0.8 mmol/L (ref 0.5–1.9)

## 2023-03-22 LAB — HEPATIC FUNCTION PANEL
ALT: 9 U/L (ref 0–44)
AST: 15 U/L (ref 15–41)
Albumin: 2.4 g/dL — ABNORMAL LOW (ref 3.5–5.0)
Alkaline Phosphatase: 91 U/L (ref 38–126)
Bilirubin, Direct: 0.2 mg/dL (ref 0.0–0.2)
Indirect Bilirubin: 0.4 mg/dL (ref 0.3–0.9)
Total Bilirubin: 0.6 mg/dL (ref 0.0–1.2)
Total Protein: 6.3 g/dL — ABNORMAL LOW (ref 6.5–8.1)

## 2023-03-22 LAB — LIPID PANEL
Cholesterol: 119 mg/dL (ref 0–200)
HDL: 22 mg/dL — ABNORMAL LOW (ref >40)
LDL Cholesterol: 85 mg/dL (ref 0–99)
Total CHOL/HDL Ratio: 5.4 ratio
Triglycerides: 62 mg/dL (ref <150)
VLDL: 12 mg/dL (ref 0–40)

## 2023-03-22 LAB — T4, FREE: Free T4: 1.16 ng/dL — ABNORMAL HIGH (ref 0.61–1.12)

## 2023-03-22 LAB — MAGNESIUM: Magnesium: 1.7 mg/dL (ref 1.7–2.4)

## 2023-03-22 MED ORDER — ATORVASTATIN CALCIUM 40 MG PO TABS
40.0000 mg | ORAL_TABLET | Freq: Every day | ORAL | Status: DC
  Administered 2023-03-22 – 2023-03-27 (×6): 40 mg via ORAL
  Filled 2023-03-22 (×6): qty 1

## 2023-03-22 MED ORDER — POTASSIUM CHLORIDE 20 MEQ PO PACK
20.0000 meq | PACK | Freq: Once | ORAL | Status: AC
  Administered 2023-03-22: 20 meq via ORAL
  Filled 2023-03-22: qty 1

## 2023-03-22 MED ORDER — POTASSIUM CHLORIDE CRYS ER 20 MEQ PO TBCR
40.0000 meq | EXTENDED_RELEASE_TABLET | Freq: Once | ORAL | Status: AC
  Administered 2023-03-22: 40 meq via ORAL
  Filled 2023-03-22: qty 2

## 2023-03-22 MED ORDER — LEVOTHYROXINE SODIUM 25 MCG PO TABS
137.0000 ug | ORAL_TABLET | Freq: Every day | ORAL | Status: DC
  Administered 2023-03-22: 137 ug via ORAL
  Filled 2023-03-22: qty 1

## 2023-03-22 MED ORDER — AMLODIPINE BESYLATE 5 MG PO TABS
2.5000 mg | ORAL_TABLET | Freq: Every day | ORAL | Status: DC
  Administered 2023-03-22 – 2023-03-23 (×2): 2.5 mg via ORAL
  Filled 2023-03-22 (×2): qty 1

## 2023-03-22 MED ORDER — LEVOTHYROXINE SODIUM 100 MCG PO TABS
100.0000 ug | ORAL_TABLET | Freq: Every day | ORAL | Status: DC
  Administered 2023-03-23 – 2023-03-27 (×5): 100 ug via ORAL
  Filled 2023-03-22 (×5): qty 1

## 2023-03-22 MED ORDER — SODIUM CHLORIDE 0.9 % IV SOLN
2.0000 g | INTRAVENOUS | Status: DC
  Administered 2023-03-22 – 2023-03-23 (×2): 2 g via INTRAVENOUS
  Filled 2023-03-22 (×2): qty 20

## 2023-03-22 MED ORDER — MAGNESIUM SULFATE 2 GM/50ML IV SOLN
2.0000 g | Freq: Once | INTRAVENOUS | Status: AC
  Administered 2023-03-22: 2 g via INTRAVENOUS
  Filled 2023-03-22: qty 50

## 2023-03-22 MED ORDER — OXYCODONE HCL 5 MG PO TABS
5.0000 mg | ORAL_TABLET | Freq: Once | ORAL | Status: AC
  Administered 2023-03-22: 5 mg via ORAL
  Filled 2023-03-22: qty 1

## 2023-03-22 MED ORDER — SODIUM CHLORIDE 0.9 % IV SOLN: 500.0000 mg | INTRAVENOUS | Status: DC

## 2023-03-22 MED ORDER — NITROGLYCERIN 2 % TD OINT
0.5000 [in_us] | TOPICAL_OINTMENT | Freq: Four times a day (QID) | TRANSDERMAL | Status: DC
  Administered 2023-03-22 – 2023-03-24 (×7): 0.5 [in_us] via TOPICAL
  Filled 2023-03-22: qty 30

## 2023-03-22 MED ORDER — NITROGLYCERIN 0.4 MG SL SUBL: 0.4000 mg | SUBLINGUAL_TABLET | SUBLINGUAL | Status: DC | PRN

## 2023-03-22 MED ORDER — OXYCODONE HCL 5 MG PO TABS
5.0000 mg | ORAL_TABLET | ORAL | Status: AC
  Administered 2023-03-22: 5 mg via ORAL
  Filled 2023-03-22: qty 1

## 2023-03-22 MED ORDER — AZITHROMYCIN 500 MG PO TABS
500.0000 mg | ORAL_TABLET | Freq: Every day | ORAL | Status: AC
  Administered 2023-03-22 – 2023-03-25 (×4): 500 mg via ORAL
  Filled 2023-03-22 (×4): qty 1

## 2023-03-22 MED ORDER — ATENOLOL 50 MG PO TABS
25.0000 mg | ORAL_TABLET | Freq: Every day | ORAL | Status: DC
  Administered 2023-03-22 – 2023-03-26 (×5): 25 mg via ORAL
  Filled 2023-03-22 (×6): qty 1

## 2023-03-22 NOTE — H&P (Signed)
 History and Physical    [name] GNF:[id number] DOB: 1945/12/17 DOA: 03/21/2023  Patient coming from: Home.  Chief Complaint: Chest pain.  HPI: [name] is a 78 y.o. female with history of CAD status post multiple PCI's, diastolic CHF, diabetes mellitus presently not on medication, hypothyroidism presents to the ER with complaint of chest pain.  Patient has been having chest pain off and on for last 3 weeks and had followed up with cardiologist on 03/02/2023.  At that time cardiologist was planning to start patient on Imdur and if chest pain persist to have further cardiac workup.  Patient's primary care physician ordered CT chest due to persistent pain which shows right upper lobe lung lesion concerning for necrotizing neoplastic lesion versus pneumonia.  Patient presents to the ER.  Patient states she also had a fall and hit her right side of the chest following which she had more chest pain.  ED Course: In the ER patient has low normal blood pressure.  Afebrile.  Labs showed WBC count of 8.7 troponins were negative.  Lactic acid was normal.  EKG shows ectopic atrial rhythm.  ER physician discussed with pulmonologist who is planning to see patient in consult for further workup of possible lung mass.  Was empirically placed on antibiotics.  Review of Systems: As per HPI, rest all negative.   Past Medical History:  Diagnosis Date   AAA (abdominal aortic aneurysm) (HCC)    a. s/p stent graft repair 01/2016.   Acute ischemic colitis (HCC) 07/29/2010   Diagnosed June, 2012 characterized by acute lower GI bleeding, spontaneously resolved.    Acute respiratory infection 06/09/2014   Acute sinus infection 05/14/2015   Allergic rhinitis, cause unspecified    Angioedema of lips 06/03/2014   Anxiety state, unspecified    Arthritis of left hip 04/16/2015   Bilateral hearing loss 07/19/2017   Chronic LBP    Coronary artery disease    a. inferior MI 1998 s/p PCI of RCA. b. stenting of Cx 04/2010.  c. DES to prox LAD 05/2013. d. DES x 3 in 10/2015. // Myoview 07/2018:  EF 59, normal perfusion, low risk    Coronary artery disease involving native heart without angina pectoris 07/31/2006   Qualifier: Diagnosis of  By: Debby Bud MD, Rosalyn Gess  ANGIOGRAPHIC DATA:  1. Ventriculography done in the RAO projection reveals a small wall  motion abnormality in the mid inferior wall. Ejection fraction  would be estimated at around 50%.  2. The right coronary artery has some progressive disease of about 60-  80% in the proximal mid segment. The distal vessel appears to be  50-70% narrowing althou   Degeneration of lumbar or lumbosacral intervertebral disc    Depressive disorder, not elsewhere classified    Diabetes (HCC) 11/03/2006   Qualifier: Diagnosis of  By: Jonny Ruiz MD, Len Blalock    Diabetes mellitus    TYPE II   Frequent urination 05/08/2014   Gout 08/22/2013   History of endovascular stent graft for abdominal aortic aneurysm (AAA) 01/22/2016   Hyperlipidemia    Hypertension    Hypothyroidism    Iron deficiency anemia 06/13/2017   Lower GI bleed 06/2010   Diverticular bleed   Lumbar radiculopathy 05/15/2015   MENOPAUSAL DISORDER 10/29/2007   Qualifier: Diagnosis of  By: Jonny Ruiz MD, Len Blalock    Myalgia 09/25/2013   Noncompliance with medications 02/26/2014   Obesity, unspecified    OTITIS MEDIA, SEROUS, CHRONIC 04/23/2007   Qualifier: Diagnosis of  By:  Norins MD, Rosalyn Gess    Thoracic disc disease 11/19/2018   URTICARIA 09/23/2009   Qualifier: Diagnosis of  By: Felicity Coyer MD, Vikki Ports A    Venous insufficiency 07/19/2017    Past Surgical History:  Procedure Laterality Date   CARDIAC CATHETERIZATION     PCI OF BOTH THE CIRCUMFLEX AND LEFT ANTERIOR DESCENDING ARTERY   CARDIAC CATHETERIZATION N/A 10/29/2015   Procedure: Coronary Stent Intervention;  Surgeon: Yvonne Kendall, MD;  Location: [facility] INVASIVE CV LAB;  Service: Cardiovascular;  Laterality: N/A;   CARDIAC CATHETERIZATION N/A 10/29/2015   Procedure:  Coronary/Graft Angiography;  Surgeon: Yvonne Kendall, MD;  Location: [facility] INVASIVE CV LAB;  Service: Cardiovascular;  Laterality: N/A;   CARDIAC CATHETERIZATION N/A 10/29/2015   Procedure: Intravascular Pressure Wire/FFR Study;  Surgeon: Yvonne Kendall, MD;  Location: [facility] INVASIVE CV LAB;  Service: Cardiovascular;  Laterality: N/A;   CESAREAN SECTION     ENDOVASCULAR STENT INSERTION N/A 01/22/2016   Procedure: ABDOMINAL AORTIC ENDOVASCULAR STENT GRAFT INSERTION;  Surgeon: Larina Earthly, MD;  Location: [facility] OR;  Service: Vascular;  Laterality: N/A;   HEART STENT  04-2010  and  Jun 07, 2013   X 3   LEFT HEART CATH AND CORONARY ANGIOGRAPHY N/A 04/03/2019   Procedure: LEFT HEART CATH AND CORONARY ANGIOGRAPHY;  Surgeon: Yvonne Kendall, MD;  Location: [facility] INVASIVE CV LAB;  Service: Cardiovascular;  Laterality: N/A;   LEFT HEART CATHETERIZATION WITH CORONARY ANGIOGRAM N/A 06/07/2013   Procedure: LEFT HEART CATHETERIZATION WITH CORONARY ANGIOGRAM;  Surgeon: Kathleene Hazel, MD;  Location: [facility] CATH LAB;  Service: Cardiovascular;  Laterality: N/A;   LEFT HEART CATHETERIZATION WITH CORONARY ANGIOGRAM N/A 02/25/2014   Procedure: LEFT HEART CATHETERIZATION WITH CORONARY ANGIOGRAM;  Surgeon: Lennette Bihari, MD;  Location[facility] CATH LAB;  Service: Cardiovascular;  Laterality: N/A;   LUMBAR FUSION  01/2007   DR. Noel Gerold...3-LEVEL WITH FIXATION   OOPHORECTOMY     BSO? pt.unsure   PARATHYROIDECTOMY     SPINE SURGERY     THYROIDECTOMY     TOTAL ABDOMINAL HYSTERECTOMY       reports that she quit smoking about 36 years ago. Her smoking use included cigarettes. She started smoking about 66 years ago. She has never used smokeless tobacco. She reports that she does not drink alcohol and does not use drugs.  Allergies  Allergen Reactions   Crestor [Rosuvastatin Calcium] Other (See Comments)    Feeling poor   Prilosec [Omeprazole] Other (See Comments)    Chest pain   Miconazole Nitrate Hives    REACTION: hives    Augmentin [Amoxicillin-Pot Clavulanate] Hives, Itching and Rash    Has patient had a PCN reaction causing immediate rash, facial/tongue/throat swelling, SOB or lightheadedness with hypotension:unsure Has patient had a PCN reaction causing severe rash involving mucus membranes or skin necrosis:unsure Has patient had a PCN reaction that required hospitalization:No Has patient had a PCN reaction occurring within the last 10 years:NO If all of the above answers are "NO", then may proceed with Cephalosporin use. Has patient had a PCN reaction causing immediate rash, facial/tongue/throat swelling, SOB or lightheadedness with hypotension:unsure Has patient had a PCN reaction causing severe rash involving mucus membranes or skin necrosis:unsure Has patient had a PCN reaction that required hospitalization:No Has patient had a PCN reaction occurring within the last 10 years:NO If all of the above answers are "NO", then may proceed with Cephalosporin use.    Doxycycline Other (See Comments)    REACTION: gi upset    Family  History  Problem Relation Age of Onset   Heart attack Mother 104       s/p D&C-CARDIAC ARREST 1966   Heart disease Mother    Heart attack Father 91       65 WITH MI   Heart disease Father    Diabetes Brother    Diabetes Maternal Aunt    Arthritis Maternal Aunt    Breast cancer Maternal Aunt        Post menopausal   Colon cancer Neg Hx     Prior to Admission medications   Medication Sig Start Date End Date Taking? Authorizing Provider  aspirin 81 MG EC tablet Take 81 mg by mouth daily.    Yes [provider]  atenolol (TENORMIN) 25 MG tablet TAKE 1 TABLET BY MOUTH EVERY DAY. 01/20/23  Yes Corwin Levins, MD  gabapentin (NEURONTIN) 300 MG capsule Take 1-2 tab by mouth at bedtime for pain and sleep Patient taking differently: Take 300 mg by mouth as needed. Take 1-2 tab by mouth at bedtime for pain and sleep 04/22/21  Yes Corwin Levins, MD  levothyroxine (SYNTHROID) 137  MCG tablet Take 1 tablet (137 mcg total) by mouth daily before breakfast. 02/22/23  Yes Corwin Levins, MD  Triamcinolone Acetonide (ZILRETTA) 32 MG SRER intra-articular injection 5 mL, intra-articular, bilat knees 01/27/23  Yes Rodolph Bong, MD  allopurinol (ZYLOPRIM) 300 MG tablet Take 1 tablet (300 mg total) by mouth as needed (for Gout). Patient not taking: Reported on 03/21/2023 10/26/20   Corwin Levins, MD  atorvastatin (LIPITOR) 40 MG tablet Take 1 tablet (40 mg total) by mouth daily. Patient not taking: Reported on 03/21/2023 10/07/22 01/05/23  Tonny Bollman, MD  furosemide (LASIX) 40 MG tablet Take 1 tablet (40 mg total) by mouth daily. Patient not taking: Reported on 03/21/2023 04/22/21   Corwin Levins, MD  nitroGLYCERIN (NITROSTAT) 0.4 MG SL tablet Place 1 tablet (0.4 mg total) under the tongue every 5 (five) minutes as needed for chest pain. Patient not taking: Reported on 03/21/2023 01/20/23   Tonny Bollman, MD  potassium chloride (KLOR-CON) 10 MEQ tablet Take 1 tablet (10 mEq total) by mouth daily. Patient not taking: Reported on 03/21/2023 02/22/23   Corwin Levins, MD  sulfamethoxazole-trimethoprim (BACTRIM DS) 800-160 MG tablet Take 1 tablet by mouth 2 (two) times daily. Patient not taking: Reported on 03/21/2023 03/20/23   Corwin Levins, MD    Physical Exam: Constitutional: Moderately built and nourished. Vitals:   03/22/23 0245 03/22/23 0315 03/22/23 0345 03/22/23 0400  BP: 110/61 113/78 (!) 105/57 (!) 105/58  Pulse: 80 84 77 79  Resp: 17 17 17 17   Temp:      TempSrc:      SpO2: 91% 92% 90% 90%  Weight:      Height:       Eyes: Anicteric no pallor. ENMT: No discharge from the ears eyes nose or mouth. Neck: No mass felt.  No neck rigidity. Respiratory: No rhonchi or crepitations. Cardiovascular: S1-S2 heard. Abdomen: Soft nontender bowel sound present. Musculoskeletal: No edema. Skin: No rash. Neurologic: Alert awake oriented to time place and person.  Moves all  extremities. Psychiatric: Appears normal.  Normal affect.   Labs on Admission: I have personally reviewed following labs and imaging studies  CBC: Recent Labs  Lab 03/21/23 1837  WBC 8.7  HGB 14.2  HCT 46.1*  MCV 88.3  PLT 306   Basic Metabolic Panel: Recent Labs  Lab 03/21/23  1837  NA 141  K 3.3*  CL 102  CO2 25  GLUCOSE 78  BUN <5*  CREATININE 0.64  CALCIUM 9.0   GFR: Estimated Creatinine Clearance: 62.8 mL/min (by C-G formula based on SCr of 0.64 mg/dL). Liver Function Tests: No results for input(s): "AST", "ALT", "ALKPHOS", "BILITOT", "PROT", "ALBUMIN" in the last 168 hours. No results for input(s): "LIPASE", "AMYLASE" in the last 168 hours. No results for input(s): "AMMONIA" in the last 168 hours. Coagulation Profile: No results for input(s): "INR", "PROTIME" in the last 168 hours. Cardiac Enzymes: No results for input(s): "CKTOTAL", "CKMB", "CKMBINDEX", "TROPONINI" in the last 168 hours. BNP (last 3 results) No results for input(s): "PROBNP" in the last 8760 hours. HbA1C: No results for input(s): "HGBA1C" in the last 72 hours. CBG: No results for input(s): "GLUCAP" in the last 168 hours. Lipid Profile: No results for input(s): "CHOL", "HDL", "LDLCALC", "TRIG", "CHOLHDL", "LDLDIRECT" in the last 72 hours. Thyroid Function Tests: No results for input(s): "TSH", "T4TOTAL", "FREET4", "T3FREE", "THYROIDAB" in the last 72 hours. Anemia Panel: No results for input(s): "VITAMINB12", "FOLATE", "FERRITIN", "TIBC", "IRON", "RETICCTPCT" in the last 72 hours. Urine analysis:    Component Value Date/Time   COLORURINE YELLOW 02/21/2023 1424   APPEARANCEUR CLEAR 02/21/2023 1424   LABSPEC <=1.005 (A) 02/21/2023 1424   PHURINE 6.0 02/21/2023 1424   GLUCOSEU NEGATIVE 02/21/2023 1424   HGBUR NEGATIVE 02/21/2023 1424   HGBUR negative 06/22/2007 1400   BILIRUBINUR NEGATIVE 02/21/2023 1424   BILIRUBINUR negative 01/16/2018 0939   KETONESUR NEGATIVE 02/21/2023 1424    PROTEINUR Negative 01/16/2018 0939   PROTEINUR NEGATIVE 06/11/2017 0345   UROBILINOGEN 0.2 02/21/2023 1424   NITRITE NEGATIVE 02/21/2023 1424   LEUKOCYTESUR NEGATIVE 02/21/2023 1424   Sepsis Labs: @LABRCNTIP (procalcitonin:4,lacticidven:4) )No results found for this or any previous visit (from the past 240 hours).   Radiological Exams on Admission: DG Chest 2 View Result Date: 03/21/2023 CLINICAL DATA:  Intermittent chest pain EXAM: CHEST - 2 VIEW COMPARISON:  CT chest dated 03/20/2023, chest radiograph dated 02/21/2023 FINDINGS: Patient is rotated to the right. Normal lung volumes. Superolateral right lung opacity, better evaluated on recent chest CT. Minimal adjacent interstitial opacities. No pleural effusion or pneumothorax. The heart size and mediastinal contours are within normal limits. No acute osseous abnormality. Partially imaged thoracolumbar spinal fixation hardware appears intact. Partially imaged aortic stent graft. IMPRESSION: Superolateral right lung opacity, better evaluated on recent chest CT. No new focal consolidations. Electronically Signed   By: Agustin Cree M.D.   On: 03/21/2023 19:57   CT Chest W Contrast Result Date: 03/20/2023 CLINICAL DATA:  Peripheral right upper lobe opacity which may be cavitary seen on chest radiographs dated 02/21/2023. Chest CT was recommended for further evaluation. EXAM: CT CHEST WITH CONTRAST TECHNIQUE: Multidetector CT imaging of the chest was performed during intravenous contrast administration. RADIATION DOSE REDUCTION: This exam was performed according to the departmental dose-optimization program which includes automated exposure control, adjustment of the mA and/or kV according to patient size and/or use of iterative reconstruction technique. CONTRAST:  75mL ISOVUE-300 IOPAMIDOL (ISOVUE-300) INJECTION 61% COMPARISON:  Chest radiographs dated 02/21/2023 and 04/03/2019. Chest CTA dated 06/11/2017. FINDINGS: Cardiovascular: Atheromatous calcifications,  including the coronary arteries and aorta. Normal-sized heart. No pericardial effusion. Mediastinum/Nodes: Surgically absent left lobe thyroid and isthmus. Normal-appearing right lobe. Mildly enlarged right anterior paratracheal/precarinal node with a short axis diameter of 14 mm on image number 135/10 without significant change since 06/11/2017. No interval enlarged lymph nodes elsewhere. Unremarkable trachea and esophagus.  Lungs/Pleura: Irregular, pleural-based mass laterally in the right upper chest. This contains a minimal air or gas-filled cavitary component and is predominantly low-density centrally, possibly necrotic or filled with fluid. This is difficult to assess due to extensive streak artifact from spinal hardware. This area measures 8.2 x 2.6 cm on image number 89/10. This measures 7.1 cm in length on coronal image number 97/4. No pleural fluid elsewhere on either side. There are patchy, linear and interstitial densities in the right upper lobe adjacent to and inferior to the pleural-based mass. Clear left lung.  No lung nodules seen. Upper Abdomen: Extensive streak artifacts from spine fixation hardware. Multiple small colonic diverticula. Oval area of low density in the left lobe of the liver measuring 4.2 x 3.3 cm on image number 297/10. This measures 19 Hounsfield units in density, including increased density associated with streak artifacts. This measured 2.7 x 2.1 cm and -4 Hounsfield units in density on 02/24/2017, compatible with a simple cyst, not requiring imaging follow-up. Musculoskeletal: Pedicle screw and rod fixation throughout the thoracic and lumbar spine from the upper thoracic spine down with associated extensive streak artifacts. Extensive thoracic and lower cervical spine degenerative changes. Grade 1 anterolisthesis in the lower thoracic spine with pedicle screw tracts compatible with removed screws. IMPRESSION: 1. 8.2 x 2.6 x 7.1 cm irregular, pleural-based mass laterally in the  right upper chest with a minimal air or gas-filled cavitary component and predominantly low-density centrally, possibly necrotic or filled with fluid. This is difficult to assess due to extensive streak artifacts from spinal hardware. This could represent a necrotic malignancy or an infectious process with a empyema. 2. Patchy, linear and interstitial densities in the right upper lobe adjacent to and inferior to the pleural-based mass, likely due to infection. This is more extensive and pronounced than expected with lymphangitic spread of tumor. 3. Stable mildly enlarged right anterior paratracheal/precarinal lymph node since 06/11/2017, compatible with a benign lymph node. 4.  Calcific coronary artery and aortic atherosclerosis. 5. Colonic diverticulosis. Aortic Atherosclerosis (ICD10-I70.0). Electronically Signed   By: Beckie Salts M.D.   On: 03/20/2023 17:37    EKG: Independently reviewed.  Ectopic atrial rhythm.  Assessment/Plan Principal Problem:   Pneumonia Active Problems:   Hypothyroidism   Diabetes (HCC)   Mixed hyperlipidemia   Essential hypertension   Abdominal aortic aneurysm (AAA) without rupture   Coronary artery disease   Chronic heart failure with preserved ejection fraction (HFpEF) (HCC)   Right-sided chest pain   Lung mass    Right lung mass versus pneumonia as seen on the CAT scan.  Empiric antibiotics were started for possible pneumonia.  Pulmonary has been consulted for further workup on the lung mass. Chest pain could be from the right lung mass versus pneumonia.  However given the history of CAD I did consult cardiology to get further opinion.  Patient only takes aspirin and atenolol.  Does not take her Lipitor.  Will await further recommendations from cardiology.  2D echo. CAD status post PCI's.  See #2.  Check 2D echo. Hypothyroidism on Synthroid. Chronic diastolic CHF appears compensated.  Takes as needed Lasix.  Last EF was 55% with grade 1 diastolic dysfunction on  January 2023. Diabetes mellitus type 2 last hemoglobin A1c was 5.5 on 2/25.  No longer on any antidiabetic medications.  Will closely monitor CBGs. History of abdominal aortic aneurysm denies any abdominal pain.  Patient has possible lung mass versus pneumonia will need further workup and more than 2 midnight stay and  inpatient status.   DVT prophylaxis: SCDs.  In anticipation of possible procedure for the lung holding anticoagulants. Code Status: Full code. Family Communication: Discussed with patient. Disposition Plan: Monitored bed. Consults called: Pulmonologist and cardiologist. Admission status: Inpatient.

## 2023-03-22 NOTE — Progress Notes (Signed)
 Patient received on the floor at 1430 via bed. Alert and oriented. On RA. Will continue to monitor

## 2023-03-22 NOTE — Consult Note (Signed)
 NAME:  [name], MRN:  [id number], DOB:  03-31-1945, LOS: 1 ADMISSION DATE:  03/21/2023, CONSULTATION DATE:  3/5 REFERRING MD:  Andi Hence, CHIEF COMPLAINT:  hypoxia   History of Present Illness:  [name] is a 78 y/o woman with a history of CAD, former tobacco abuse (30 years, quit 1988) who presented with sharp, aching, intermittent right sided chest pain for the past month. It began after a fall several weeks ago, but has persistent. Notes in the chart describe central chest pain that is stabbing, that seems to be different than what she described to me. She was recently seen by cardiology for chest pain with plans to start Imdur to assess for response, and potentially workup for ischemic CAD if no response. No f/c/s, no SOB. Cough is at baseline with some blood-streaked sputum. No n/v/abdominal pain. She has had some diarrhea but not bloody that she is aware of. No edema, no dizziness. Some paresthesias in R hand. No personal or family history of cancers. No previous lung disease. She quit smoking in the 1980s after 30 years of <1ppd.  In the ED she had a CT performed demonstrating a R lung opacity. PCCM consulted for evaluation. She was empirically started on ceftriaxone and azithromycin.  Pertinent  Medical History  CAD IDA Hypothyroidism DM HTN HLD AAA  Significant Hospital Events: Including procedures, antibiotic start and stop dates in addition to other pertinent events   3/4 presented to ED  Interim History / Subjective:    Objective   Blood pressure (!) 99/58, pulse 82, temperature 98.8 F (37.1 C), temperature source Oral, resp. rate 17, height 5\' 6"  (1.676 m), weight 79.8 kg, SpO2 95%.        Intake/Output Summary (Last 24 hours) at 03/22/2023 7829 Last data filed at 03/21/2023 2339 Gross per 24 hour  Intake 343.52 ml  Output --  Net 343.52 ml   Filed Weights   03/21/23 1733  Weight: 79.8 kg    Examination: General: Elderly chronically ill-appearing  woman lying in bed in NAD HENT: [place]/AT, eyes anicteric Neck: no cervical or supraclavicular adenopathy Lungs: Breathing comfortably on room air, right lateral rales, otherwise CTAB.  No conversational dyspnea. Cardiovascular: S1-S2, regular rate and rhythm Abdomen: Soft, nontender, nondistended Extremities: No peripheral edema Neuro: Awake, alert, answering questions appropriately, moving extremities Derm: Skin warm, dry, thin  CT chest personally  reviewed> R peripheral opacity, ill defined, apical RUL opacity, pleural based.   Resolved Hospital Problem list     Assessment & Plan:  Right sided CP due to peripheral opacity on lung CT-- cause of opacity is not clear, but it's persistence since CXR 02/21/23 is concerning. Differential is lung mass (tobacco abuse history is concerning) vs infection (but doesn't have classic infectious symptoms), vs pulmonary infarct (no PE seen on CT to support this). -agree with empiric antibiotics- azithromycin and ceftriaxone -discussed the risks vs benefits of antibiotics with close imaging follow up in a few weeks vs an invasive procedure to get cultures and biopsies, specifically bronchsocopy. I reviewed the case with one of our advanced bronchoscopists, who agrees this is a likely low yield procedure with transbronchial biopsy and recommended IR evaluation. Reviewed case with IR, who will discuss with her. She prefers to have this more aggressively evaluated while she is admitted rather than waiting to come back to for a procedure later if this does not resolve with antibiotics.   -keep NPO today  Paresthesias in R hand -not classic for pancoast tumor,  but worth thinking about  Hypokalemia -repleted  Best Practice (right click and "Reselect all SmartList Selections" daily)  Per primary  Labs   CBC: Recent Labs  Lab 03/21/23 1837 03/22/23 0642  WBC 8.7 9.0  NEUTROABS  --  5.8  HGB 14.2 12.1  HCT 46.1* 38.5  MCV 88.3 87.9  PLT 306 284     Basic Metabolic Panel: Recent Labs  Lab 03/21/23 1837  NA 141  K 3.3*  CL 102  CO2 25  GLUCOSE 78  BUN <5*  CREATININE 0.64  CALCIUM 9.0   GFR: Estimated Creatinine Clearance: 62.8 mL/min (by C-G formula based on SCr of 0.64 mg/dL). Recent Labs  Lab 03/21/23 1837 03/21/23 2113 03/22/23 0056 03/22/23 0642  WBC 8.7  --   --  9.0  LATICACIDVEN  --  1.0 0.8  --     Liver Function Tests: No results for input(s): "AST", "ALT", "ALKPHOS", "BILITOT", "PROT", "ALBUMIN" in the last 168 hours. No results for input(s): "LIPASE", "AMYLASE" in the last 168 hours. No results for input(s): "AMMONIA" in the last 168 hours.  ABG    Component Value Date/Time   PHART 7.420 01/15/2016 1122   PCO2ART 37.0 01/15/2016 1122   PO2ART 91.9 01/15/2016 1122   HCO3 23.6 01/15/2016 1122   TCO2 26 09/25/2010 1431   ACIDBASEDEF 0.3 01/15/2016 1122   O2SAT 97.3 01/15/2016 1122     Coagulation Profile: No results for input(s): "INR", "PROTIME" in the last 168 hours.  Cardiac Enzymes: No results for input(s): "CKTOTAL", "CKMB", "CKMBINDEX", "TROPONINI" in the last 168 hours.  HbA1C: Hgb A1c MFr Bld  Date/Time Value Ref Range Status  02/21/2023 02:24 PM 5.5 4.6 - 6.5 % Final    Comment:    Glycemic Control Guidelines for People with Diabetes:Non Diabetic:  <6%Goal of Therapy: <7%Additional Action Suggested:  >8%   08/04/2022 02:53 PM 5.3 4.6 - 6.5 % Final    Comment:    Glycemic Control Guidelines for People with Diabetes:Non Diabetic:  <6%Goal of Therapy: <7%Additional Action Suggested:  >8%     CBG: No results for input(s): "GLUCAP" in the last 168 hours.  Review of Systems:   Review of Systems  Constitutional:  Negative for chills and fever.  Respiratory:  Positive for sputum production. Negative for cough and shortness of breath.   Cardiovascular:  Positive for chest pain. Negative for leg swelling.  Gastrointestinal:  Positive for diarrhea. Negative for abdominal pain, nausea  and vomiting.  Neurological:  Positive for sensory change. Negative for dizziness and focal weakness.     Past Medical History:  She,  has a past medical history of AAA (abdominal aortic aneurysm) (HCC), Acute ischemic colitis (HCC) (07/29/2010), Acute respiratory infection (06/09/2014), Acute sinus infection (05/14/2015), Allergic rhinitis, cause unspecified, Angioedema of lips (06/03/2014), Anxiety state, unspecified, Arthritis of left hip (04/16/2015), Bilateral hearing loss (07/19/2017), Chronic LBP, Coronary artery disease, Coronary artery disease involving native heart without angina pectoris (07/31/2006), Degeneration of lumbar or lumbosacral intervertebral disc, Depressive disorder, not elsewhere classified, Diabetes (HCC) (11/03/2006), Diabetes mellitus, Frequent urination (05/08/2014), Gout (08/22/2013), History of endovascular stent graft for abdominal aortic aneurysm (AAA) (01/22/2016), Hyperlipidemia, Hypertension, Hypothyroidism, Iron deficiency anemia (06/13/2017), Lower GI bleed (06/2010), Lumbar radiculopathy (05/15/2015), MENOPAUSAL DISORDER (10/29/2007), Myalgia (09/25/2013), Noncompliance with medications (02/26/2014), Obesity, unspecified, OTITIS MEDIA, SEROUS, CHRONIC (04/23/2007), Thoracic disc disease (11/19/2018), URTICARIA (09/23/2009), and Venous insufficiency (07/19/2017).   Surgical History:   Past Surgical History:  Procedure Laterality Date   CARDIAC CATHETERIZATION  PCI OF BOTH THE CIRCUMFLEX AND LEFT ANTERIOR DESCENDING ARTERY   CARDIAC CATHETERIZATION N/A 10/29/2015   Procedure: Coronary Stent Intervention;  Surgeon: Yvonne Kendall, MD;  Location: [facility] INVASIVE CV LAB;  Service: Cardiovascular;  Laterality: N/A;   CARDIAC CATHETERIZATION N/A 10/29/2015   Procedure: Coronary/Graft Angiography;  Surgeon: Yvonne Kendall, MD;  Location: [facility] INVASIVE CV LAB;  Service: Cardiovascular;  Laterality: N/A;   CARDIAC CATHETERIZATION N/A 10/29/2015   Procedure: Intravascular Pressure Wire/FFR Study;   Surgeon: Yvonne Kendall, MD;  Location: [facility] INVASIVE CV LAB;  Service: Cardiovascular;  Laterality: N/A;   CESAREAN SECTION     ENDOVASCULAR STENT INSERTION N/A 01/22/2016   Procedure: ABDOMINAL AORTIC ENDOVASCULAR STENT GRAFT INSERTION;  Surgeon: Larina Earthly, MD;  Location: [facility] OR;  Service: Vascular;  Laterality: N/A;   HEART STENT  04-2010  and  Jun 07, 2013   X 3   LEFT HEART CATH AND CORONARY ANGIOGRAPHY N/A 04/03/2019   Procedure: LEFT HEART CATH AND CORONARY ANGIOGRAPHY;  Surgeon: Yvonne Kendall, MD;  Location: [facility] INVASIVE CV LAB;  Service: Cardiovascular;  Laterality: N/A;   LEFT HEART CATHETERIZATION WITH CORONARY ANGIOGRAM N/A 06/07/2013   Procedure: LEFT HEART CATHETERIZATION WITH CORONARY ANGIOGRAM;  Surgeon: Kathleene Hazel, MD;  Location: [facility] CATH LAB;  Service: Cardiovascular;  Laterality: N/A;   LEFT HEART CATHETERIZATION WITH CORONARY ANGIOGRAM N/A 02/25/2014   Procedure: LEFT HEART CATHETERIZATION WITH CORONARY ANGIOGRAM;  Surgeon: Lennette Bihari, MD;  Location: [facility] CATH LAB;  Service: Cardiovascular;  Laterality: N/A;   LUMBAR FUSION  01/2007   DR. Noel Gerold...3-LEVEL WITH FIXATION   OOPHORECTOMY     BSO? pt.unsure   PARATHYROIDECTOMY     SPINE SURGERY     THYROIDECTOMY     TOTAL ABDOMINAL HYSTERECTOMY       Social History:   reports that she quit smoking about 36 years ago. Her smoking use included cigarettes. She started smoking about 66 years ago. She has never used smokeless tobacco. She reports that she does not drink alcohol and does not use drugs.   Family History:  Her family history includes Arthritis in her maternal aunt; Breast cancer in her maternal aunt; Diabetes in her brother and maternal aunt; Heart attack (age of onset: 55) in her mother; Heart attack (age of onset: 65) in her father; Heart disease in her father and mother. There is no history of Colon cancer.   Allergies Allergies  Allergen Reactions   Crestor [Rosuvastatin Calcium] Other (See Comments)     Feeling poor   Prilosec [Omeprazole] Other (See Comments)    Chest pain   Miconazole Nitrate Hives    REACTION: hives   Augmentin [Amoxicillin-Pot Clavulanate] Hives, Itching and Rash    Has patient had a PCN reaction causing immediate rash, facial/tongue/throat swelling, SOB or lightheadedness with hypotension:unsure Has patient had a PCN reaction causing severe rash involving mucus membranes or skin necrosis:unsure Has patient had a PCN reaction that required hospitalization:No Has patient had a PCN reaction occurring within the last 10 years:NO If all of the above answers are "NO", then may proceed with Cephalosporin use. Has patient had a PCN reaction causing immediate rash, facial/tongue/throat swelling, SOB or lightheadedness with hypotension:unsure Has patient had a PCN reaction causing severe rash involving mucus membranes or skin necrosis:unsure Has patient had a PCN reaction that required hospitalization:No Has patient had a PCN reaction occurring within the last 10 years:NO If all of the above answers are "NO", then may proceed with Cephalosporin use.  Doxycycline Other (See Comments)    REACTION: gi upset     Home Medications  Prior to Admission medications   Medication Sig Start Date End Date Taking? Authorizing Provider  aspirin 81 MG EC tablet Take 81 mg by mouth daily.    Yes [provider]  atenolol (TENORMIN) 25 MG tablet TAKE 1 TABLET BY MOUTH EVERY DAY. 01/20/23  Yes Corwin Levins, MD  gabapentin (NEURONTIN) 300 MG capsule Take 1-2 tab by mouth at bedtime for pain and sleep Patient taking differently: Take 300 mg by mouth as needed. Take 1-2 tab by mouth at bedtime for pain and sleep 04/22/21  Yes Corwin Levins, MD  levothyroxine (SYNTHROID) 137 MCG tablet Take 1 tablet (137 mcg total) by mouth daily before breakfast. 02/22/23  Yes Corwin Levins, MD  Triamcinolone Acetonide (ZILRETTA) 32 MG SRER intra-articular injection 5 mL, intra-articular, bilat knees  01/27/23  Yes Rodolph Bong, MD  allopurinol (ZYLOPRIM) 300 MG tablet Take 1 tablet (300 mg total) by mouth as needed (for Gout). Patient not taking: Reported on 03/21/2023 10/26/20   Corwin Levins, MD  atorvastatin (LIPITOR) 40 MG tablet Take 1 tablet (40 mg total) by mouth daily. Patient not taking: Reported on 03/21/2023 10/07/22 01/05/23  Tonny Bollman, MD  furosemide (LASIX) 40 MG tablet Take 1 tablet (40 mg total) by mouth daily. Patient not taking: Reported on 03/21/2023 04/22/21   Corwin Levins, MD  nitroGLYCERIN (NITROSTAT) 0.4 MG SL tablet Place 1 tablet (0.4 mg total) under the tongue every 5 (five) minutes as needed for chest pain. Patient not taking: Reported on 03/21/2023 01/20/23   Tonny Bollman, MD  potassium chloride (KLOR-CON) 10 MEQ tablet Take 1 tablet (10 mEq total) by mouth daily. Patient not taking: Reported on 03/21/2023 02/22/23   Corwin Levins, MD  sulfamethoxazole-trimethoprim (BACTRIM DS) 800-160 MG tablet Take 1 tablet by mouth 2 (two) times daily. Patient not taking: Reported on 03/21/2023 03/20/23   Corwin Levins, MD     Critical care time:      Steffanie Dunn, DO 03/22/23 9:37 AM [facility]  For contact information, see Amion. If no response to pager, please call PCCM consult pager. After hours, 7PM- 7AM, please call Elink.

## 2023-03-22 NOTE — Plan of Care (Signed)
  Problem: Health Behavior/Discharge Planning: Goal: Ability to manage health-related needs will improve Outcome: Progressing   Problem: Clinical Measurements: Goal: Respiratory complications will improve Outcome: Progressing   Problem: Nutrition: Goal: Adequate nutrition will be maintained Outcome: Progressing   Problem: Coping: Goal: Level of anxiety will decrease Outcome: Progressing   Problem: Safety: Goal: Ability to remain free from injury will improve Outcome: Progressing   Problem: Skin Integrity: Goal: Risk for impaired skin integrity will decrease Outcome: Progressing

## 2023-03-22 NOTE — Progress Notes (Signed)
 Patient seen and examined personally, I reviewed the chart, history and physical and admission note, done by admitting physician this morning and agree with the same with following addendum.  Please refer to the morning admission note for more detailed plan of care.  Briefly,  85 yof w/ hx of CAD s/p multiple PCI's, diastolic CHF, diabetes mellitus presently not on meds, hypothyroidism presented to the ED with complaint of chest pain on and off x 3 wks, and found to have  RUL lung lesion in outpatient CT chest and concerning for necrotizing neoplastic lesion versus pneumonia.Patient states she also had a fall and hit her right side of the chest following which she had more chest pain.  In the ED vitals stable with labs unremarkable troponin negative x 2 lactic acid normal and patient was admitted for further management.  Procedure: CT Chest 3/3> 8.2 x 2.6 x 7.1 cm irregular, pleural-based mass laterally in the right upper chest with a minimal air or gas-filled cavitary component and predominantly low-density centrally, possibly necrotic or filled with fluid-necrotic malignancy or infectious process with empyema.  Stable paratracheal/precarinal lymph nodes calcific aortic and coronary atherosclerosis.  Consultation: Cardiology PCCM   --- Seen and examined this morning in the ED.  Resting comfortably, on room air lungs are clear extremities show no leg edema  A/P: Chest pain x 3 weeks RUL Lung mass: Significant mass noticed on outpatient CT chest-no acute infectious process.  Labs without leukocytosis, no fever.  Pulmonary consulted for further workup.  Empiric ceftriaxone and azithromycin.  Discussed with pulmonary this morning they feel to be difficult WITH additional bronc for peripheral lesion.  IR has been consulted for CT-guided biopsy.    Diastolic CHF: Compensated on admission. PTA on prn lasix.  CAD s/p stents: Significant cardiac history including chest pain cardiology has been  consulted continue atenolol Follow-up echocardiogram.  Hypothyroidism: cont home Synthroid.  TSH low at 0.06.  Check free T4 AAA hx: No abdominal pain currently.  Hypokalemia: Potassium 3.0.  Replace THIS AM

## 2023-03-22 NOTE — Hospital Course (Addendum)
 70 yof w/ hx of CAD s/p multiple PCI's, diastolic CHF, diabetes mellitus presently not on meds, hypothyroidism presented to the ED with complaint of chest pain on and off x 3 wks, and found to have  RUL lung lesion in outpatient CT chest and concerning for necrotizing neoplastic lesion versus pneumonia.Patient states she also had a fall and hit her right side of the chest following which she had more chest pain.  In the ED vitals stable with labs unremarkable troponin negative x 2 lactic acid normal and patient was admitted for further management of chest pain and lung mass workup. Seen by cardiology> echo obtained stable unremarkable and not pursuing cardiac cath at this time. Seen by pulmonary> and referred to IR for lung biopsy.  Patient is having some solid sternal chest pain 3/9 EKG and troponin unremarkable, seen by cardiology again started on Imdur 15 mg not pursuing ischemic workup due to lack of objective evidence and also has been off aspirin for lung biopsy 3/10 which was completed successfully.  Patient will need to follow-up outpatient for biopsy results w/ PCCM.  Procedure: CT Chest 3/3> 8.2 x 2.6 x 7.1 cm irregular, pleural-based mass laterally in the right upper chest with a minimal air or gas-filled cavitary component and predominantly low-density centrally, possibly necrotic or filled with fluid-necrotic malignancy or infectious process with empyema.  Stable paratracheal/precarinal lymph nodes calcific aortic and coronary atherosclerosis.  Consultation: Cardiology PCCM IR

## 2023-03-22 NOTE — ED Notes (Signed)
 Patient was given graham crackers, peanut butter, and coffee.

## 2023-03-22 NOTE — Consult Note (Addendum)
 Cardiology Consultation   Patient ID: [name] MRN: [id number]; DOB: [birth year]  Admit date: 03/21/2023 Date of Consult: 03/22/2023  PCP:  Corwin Levins, MD   [facility] Providers Cardiologist:  Tonny Bollman, MD  Cardiology APP:  Beatrice Lecher, PA-C    Patient Profile:   [name] is a 78 y.o. female with a hx of  Coronary artery disease  s/p prior inf MI in 1998 tx with stent to the RCA S/p stent LCx in 2012 S/p stent to the LAD in 2015 S/p DES x 3 to the RCA 2017 Myoview 07/2018: low risk Cath 03/2019: dRCA diff dz'd supplying small/dz'd PDA+PL br (not ideal for PCI) >> med Rx (HFpEF) heart failure with preserved ejection fraction  Diabetes mellitus  Hypertension Hyperlipidemia AAA S/p EVAR 01/2016  who is being seen 03/22/2023 for the evaluation of chest pain at the request of Dr. Lanae Boast.  History of Present Illness:   [name] is 39 YOF with above medical history.   2021 catheterization w. Dr. Okey Dupre: Prox-Mid LAD 30%, which was previously treated 2 years prior. Distal LAD 75%. LCX Prox-Distal 60%. Mid to Distal  RCA 70%. Recommendation: distal RCA is diffusely disease and supplies relatively small/diseased PDA and PL branches; this is not an ideal target for PCI. Other disease, the most severe of which affects the small/distal branches, has not changed significantly since 2017. Started on isosorbide mononitrate 30 mg daily coupled with high intensity statin.   ECHO 02/07/21 w. Dr. Excell Seltzer: LVEF 55. RWMA with basal to mid inferior hypokinesis. G1DD. Aortic valve calcification without stenosis. Per. Dr. Excell Seltzer note, "very good finding compared to  past echo. Continue with currrent medical therapy." Her last stress test in June 2024 showed normal findings with normal perfusion and gated LVEF of 60 to.  Lexiscan 06/21/2022: Low risk study with normal wall motion, normal LVEF of 62%, and no ischemia. Continue current management and follow-up as planned.    Last seen in cardiology office on 03/02/2023 for right-sided chest pain, described as achy and centrally located, which occurred more often after eating specific foods and relieved with belching.  At that time she did not relate symptoms to GERD instead she she noted it was similar to previous anginal symptoms.  These chest pain symptoms were relieved by 1 or 2 nitroglycerin and occurred intermittently.  They were unrelated to physical exertion however she was not very active.  She also reported unsteadiness on her feet.  At this time she denied any SOB, orthopnea, PND or edema.  Office EKG was NSR with RBBB.  She was started on isosorbide 30 mg and continued on beta-blockers, aspirin, high intensity statin drug.  It was recommended if she has progressive symptoms of angina  then we will consider for cardiac catheterization.  CT Chest: 03/20/23: 8.2 x 2.6 x 7.1 cm irregular, pleural-based mass laterally in the right upper chest, possibly necrotic or filled with fluid, could represent a necrotic malignancy or an infectious process with a empyema.  Patchy, linear and interstitial densities in the right upper lobe adjacent to and inferior to the pleural-based mass, likely due to infection. Stable mildly enlarged right anterior paratracheal/precarinal lymph node since 06/11/2017. Calcific coronary artery and aortic atherosclerosis. Colonic diverticulosis. --> Plan: Referral to pulmonary and treat with abx.    Patient presented to ED on 03/21/23 for chest pain. Given abnormal CT with low BP and lactic acid, patient was treated with IV abx and admitted to  ICU. Pulmonary was also consulted. Lung biopsy currently ordered and  pending.  Initial Labs: K 3.3, BUN <5, Cr 0.64; HsTn 7>5; Slightly elevated RBC 5.22, HCT 46.1  - initial EKG: NSR, HR 62 with RBBB, no specific ST changes  CXR 03/21/23: Superolateral right lung opacity, better evaluated on recent chest CT. No new focal consolidations. ECHO pending.    Cardiology was also consulted for chest pain. On my interview, patient reported intermittent CP x 1 week described as 4/10, tight, stabbing in center of chest. Pain was relieved after 0.4 mg NTG x3 and 324 ASA. She states the pain was similar to previous angina episodes. Pain is relieved with changes in position, both laying and sitting. Also, she reports 2 weeks ago she fell on her right side and has continued to have 10/10 pain. Denies any SOB, dizziness, diaphoresis. On exam, mild tenderness noted to the right chest wall.   Significant family history for mother and father both died of heart attacks but uncertain of the exact age. Former Smoker. Denies any ETOH or drugs. She lives alone and manages her own medications. She did not ever start a statin or Imdur. She only takes Atenolol, ASA, and nitroglycerin    Past Medical History:  Diagnosis Date   AAA (abdominal aortic aneurysm) (HCC)    a. s/p stent graft repair 01/2016.   Acute ischemic colitis (HCC) 07/29/2010   Diagnosed June, 2012 characterized by acute lower GI bleeding, spontaneously resolved.    Acute respiratory infection 06/09/2014   Acute sinus infection 05/14/2015   Allergic rhinitis, cause unspecified    Angioedema of lips 06/03/2014   Anxiety state, unspecified    Arthritis of left hip 04/16/2015   Bilateral hearing loss 07/19/2017   Chronic LBP    Coronary artery disease    a. inferior MI 1998 s/p PCI of RCA. b. stenting of Cx 04/2010. c. DES to prox LAD 05/2013. d. DES x 3 in 10/2015. // Myoview 07/2018:  EF 59, normal perfusion, low risk    Coronary artery disease involving native heart without angina pectoris 07/31/2006   Qualifier: Diagnosis of  By: Debby Bud MD, Rosalyn Gess  ANGIOGRAPHIC DATA:  1. Ventriculography done in the RAO projection reveals a small wall  motion abnormality in the mid inferior wall. Ejection fraction  would be estimated at around 50%.  2. The right coronary artery has some progressive disease of about 60-  80%  in the proximal mid segment. The distal vessel appears to be  50-70% narrowing althou   Degeneration of lumbar or lumbosacral intervertebral disc    Depressive disorder, not elsewhere classified    Diabetes (HCC) 11/03/2006   Qualifier: Diagnosis of  By: Jonny Ruiz MD, Len Blalock    Diabetes mellitus    TYPE II   Frequent urination 05/08/2014   Gout 08/22/2013   History of endovascular stent graft for abdominal aortic aneurysm (AAA) 01/22/2016   Hyperlipidemia    Hypertension    Hypothyroidism    Iron deficiency anemia 06/13/2017   Lower GI bleed 06/2010   Diverticular bleed   Lumbar radiculopathy 05/15/2015   MENOPAUSAL DISORDER 10/29/2007   Qualifier: Diagnosis of  By: Jonny Ruiz MD, Len Blalock    Myalgia 09/25/2013   Noncompliance with medications 02/26/2014   Obesity, unspecified    OTITIS MEDIA, SEROUS, CHRONIC 04/23/2007   Qualifier: Diagnosis of  By: Debby Bud MD, Rosalyn Gess    Thoracic disc disease 11/19/2018   URTICARIA 09/23/2009   Qualifier: Diagnosis of  By: Felicity Coyer  MD, Vikki Ports A    Venous insufficiency 07/19/2017    Past Surgical History:  Procedure Laterality Date   CARDIAC CATHETERIZATION     PCI OF BOTH THE CIRCUMFLEX AND LEFT ANTERIOR DESCENDING ARTERY   CARDIAC CATHETERIZATION N/A 10/29/2015   Procedure: Coronary Stent Intervention;  Surgeon: Yvonne Kendall, MD;  Location: [facility] INVASIVE CV LAB;  Service: Cardiovascular;  Laterality: N/A;   CARDIAC CATHETERIZATION N/A 10/29/2015   Procedure: Coronary/Graft Angiography;  Surgeon: Yvonne Kendall, MD;  Location: [facility] INVASIVE CV LAB;  Service: Cardiovascular;  Laterality: N/A;   CARDIAC CATHETERIZATION N/A 10/29/2015   Procedure: Intravascular Pressure Wire/FFR Study;  Surgeon: Yvonne Kendall, MD;  Location: [facility] INVASIVE CV LAB;  Service: Cardiovascular;  Laterality: N/A;   CESAREAN SECTION     ENDOVASCULAR STENT INSERTION N/A 01/22/2016   Procedure: ABDOMINAL AORTIC ENDOVASCULAR STENT GRAFT INSERTION;  Surgeon: Larina Earthly, MD;  Location: [facility] OR;   Service: Vascular;  Laterality: N/A;   HEART STENT  04-2010  and  Jun 07, 2013   X 3   LEFT HEART CATH AND CORONARY ANGIOGRAPHY N/A 04/03/2019   Procedure: LEFT HEART CATH AND CORONARY ANGIOGRAPHY;  Surgeon: Yvonne Kendall, MD;  Location: [facility] INVASIVE CV LAB;  Service: Cardiovascular;  Laterality: N/A;   LEFT HEART CATHETERIZATION WITH CORONARY ANGIOGRAM N/A 06/07/2013   Procedure: LEFT HEART CATHETERIZATION WITH CORONARY ANGIOGRAM;  Surgeon: Kathleene Hazel, MD;  Location: [facility] CATH LAB;  Service: Cardiovascular;  Laterality: N/A;   LEFT HEART CATHETERIZATION WITH CORONARY ANGIOGRAM N/A 02/25/2014   Procedure: LEFT HEART CATHETERIZATION WITH CORONARY ANGIOGRAM;  Surgeon: Lennette Bihari, MD;  Location: [facility] CATH LAB;  Service: Cardiovascular;  Laterality: N/A;   LUMBAR FUSION  01/2007   DR. Noel Gerold...3-LEVEL WITH FIXATION   OOPHORECTOMY     BSO? pt.unsure   PARATHYROIDECTOMY     SPINE SURGERY     THYROIDECTOMY     TOTAL ABDOMINAL HYSTERECTOMY       Home Medications:  Prior to Admission medications   Medication Sig Start Date End Date Taking? Authorizing Provider  aspirin 81 MG EC tablet Take 81 mg by mouth daily.    Yes [provider]  atenolol (TENORMIN) 25 MG tablet TAKE 1 TABLET BY MOUTH EVERY DAY. 01/20/23  Yes Corwin Levins, MD  gabapentin (NEURONTIN) 300 MG capsule Take 1-2 tab by mouth at bedtime for pain and sleep Patient taking differently: Take 300 mg by mouth as needed. Take 1-2 tab by mouth at bedtime for pain and sleep 04/22/21  Yes Corwin Levins, MD  levothyroxine (SYNTHROID) 137 MCG tablet Take 1 tablet (137 mcg total) by mouth daily before breakfast. 02/22/23  Yes Corwin Levins, MD  Triamcinolone Acetonide (ZILRETTA) 32 MG SRER intra-articular injection 5 mL, intra-articular, bilat knees 01/27/23  Yes Rodolph Bong, MD  allopurinol (ZYLOPRIM) 300 MG tablet Take 1 tablet (300 mg total) by mouth as needed (for Gout). Patient not taking: Reported on 03/21/2023 10/26/20   Corwin Levins, MD  atorvastatin (LIPITOR) 40 MG tablet Take 1 tablet (40 mg total) by mouth daily. Patient not taking: Reported on 03/21/2023 10/07/22 01/05/23  Tonny Bollman, MD  furosemide (LASIX) 40 MG tablet Take 1 tablet (40 mg total) by mouth daily. Patient not taking: Reported on 03/21/2023 04/22/21   Corwin Levins, MD  nitroGLYCERIN (NITROSTAT) 0.4 MG SL tablet Place 1 tablet (0.4 mg total) under the tongue every 5 (five) minutes as needed for chest pain. Patient not taking: Reported on 03/21/2023  01/20/23   Tonny Bollman, MD  potassium chloride (KLOR-CON) 10 MEQ tablet Take 1 tablet (10 mEq total) by mouth daily. Patient not taking: Reported on 03/21/2023 02/22/23   Corwin Levins, MD  sulfamethoxazole-trimethoprim (BACTRIM DS) 800-160 MG tablet Take 1 tablet by mouth 2 (two) times daily. Patient not taking: Reported on 03/21/2023 03/20/23   Corwin Levins, MD    Inpatient Medications: Scheduled Meds:  atenolol  25 mg Oral Daily   levothyroxine  137 mcg Oral QAC breakfast   Continuous Infusions:  azithromycin     cefTRIAXone (ROCEPHIN)  IV     PRN Meds: nitroGLYCERIN  Allergies:    Allergies  Allergen Reactions   Crestor [Rosuvastatin Calcium] Other (See Comments)    Feeling poor   Prilosec [Omeprazole] Other (See Comments)    Chest pain   Miconazole Nitrate Hives    REACTION: hives   Augmentin [Amoxicillin-Pot Clavulanate] Hives, Itching and Rash    Has patient had a PCN reaction causing immediate rash, facial/tongue/throat swelling, SOB or lightheadedness with hypotension:unsure Has patient had a PCN reaction causing severe rash involving mucus membranes or skin necrosis:unsure Has patient had a PCN reaction that required hospitalization:No Has patient had a PCN reaction occurring within the last 10 years:NO If all of the above answers are "NO", then may proceed with Cephalosporin use. Has patient had a PCN reaction causing immediate rash, facial/tongue/throat swelling, SOB or  lightheadedness with hypotension:unsure Has patient had a PCN reaction causing severe rash involving mucus membranes or skin necrosis:unsure Has patient had a PCN reaction that required hospitalization:No Has patient had a PCN reaction occurring within the last 10 years:NO If all of the above answers are "NO", then may proceed with Cephalosporin use.    Doxycycline Other (See Comments)    REACTION: gi upset    Social History:   Social History   Socioeconomic History   Marital status: Divorced    Spouse name: Not on file   Number of children: 3   Years of education: 13   Highest education level: Not on file  Occupational History   Occupation: RETIRED    Employer: [employer]    Comment: ADMIN SUPPORT    Employer: RETIRED  Tobacco Use   Smoking status: Former    Current packs/day: 0.00    Types: Cigarettes    Start date: 05/30/1956    Quit date: 05/31/1986    Years since quitting: 36.8   Smokeless tobacco: Never  Vaping Use   Vaping status: Never Used  Substance and Sexual Activity   Alcohol use: No   Drug use: No   Sexual activity: Not Currently    Comment: 1st intercourse 78 yo-Fewer than 5 partners  Other Topics Concern   Not on file  Social History Narrative   DIVORCED   3 CHILDREN   PATIENT SIGNED A DESIGNATED PARTY RELEASE TO ALLOW HER DAUGHTER, TRAMAINE Colley, TO HAVE ACCESS TO HER MEDICAL RECORDS/INFORMATION. Daphane Shepherd, May 04, 2009 @ 3:27 PM   Right handed   One story home   .   Social Drivers of Corporate investment banker Strain: Low Risk  (04/05/2022)   Overall Financial Resource Strain (CARDIA)    Difficulty of Paying Living Expenses: Not hard at all  Food Insecurity: No Food Insecurity (03/22/2023)   Hunger Vital Sign    Worried About Running Out of Food in the Last Year: Never true    Ran Out of Food in the Last Year:  Never true  Transportation Needs: No Transportation Needs (03/22/2023)   PRAPARE - Scientist, research (physical sciences) (Medical): No    Lack of Transportation (Non-Medical): No  Physical Activity: Inactive (04/05/2022)   Exercise Vital Sign    Days of Exercise per Week: 0 days    Minutes of Exercise per Session: 0 min  Stress: No Stress Concern Present (04/05/2022)   [employer] of Occupational Health - Occupational Stress Questionnaire    Feeling of Stress : Not at all  Social Connections: Socially Isolated (03/22/2023)   Social Connection and Isolation Panel [NHANES]    Frequency of Communication with Friends and Family: Twice a week    Frequency of Social Gatherings with Friends and Family: Once a week    Attends Religious Services: Never    Database administrator or Organizations: No    Attends Banker Meetings: Never    Marital Status: Divorced  Catering manager Violence: Not At Risk (03/22/2023)   Humiliation, Afraid, Rape, and Kick questionnaire    Fear of Current or Ex-Partner: No    Emotionally Abused: No    Physically Abused: No    Sexually Abused: No    Family History:   Family History  Problem Relation Age of Onset   Heart attack Mother 63       s/p D&C-CARDIAC ARREST 1966   Heart disease Mother    Heart attack Father 52       44 WITH MI   Heart disease Father    Diabetes Brother    Diabetes Maternal Aunt    Arthritis Maternal Aunt    Breast cancer Maternal Aunt        Post menopausal   Colon cancer Neg Hx      ROS:  Please see the history of present illness.  All other ROS reviewed and negative.     Physical Exam/Data:   Vitals:   03/22/23 1030 03/22/23 1245 03/22/23 1400 03/22/23 1435  BP: 112/71   134/64  Pulse: 94 73 75 70  Resp: (!) 21 20 18 18   Temp:    99.5 F (37.5 C)  TempSrc:    Oral  SpO2: 95% 91% 91% 97%  Weight:      Height:        Intake/Output Summary (Last 24 hours) at 03/22/2023 1508 Last data filed at 03/21/2023 2339 Gross per 24 hour  Intake 343.52 ml  Output --  Net 343.52 ml      03/21/2023    5:33 PM  03/02/2023    9:53 AM 02/21/2023    1:23 PM  Last 3 Weights  Weight (lbs) 176 lb 176 lb 3.2 oz 175 lb  Weight (kg) 79.833 kg 79.924 kg 79.379 kg     Body mass index is 28.41 kg/m.  General: Laying in bed with no acute distress HEENT: normal Neck: no JVD Vascular: No carotid bruits; Distal pulses 2+ bilaterally Cardiac:  normal S1, S2; RRR; no murmur; reproducible CP on R side chest wall  Lungs:  clear to auscultation bilaterally, no wheezing, rhonchi or rales  Abd: soft, nontender, no hepatomegaly  Ext: no edema Musculoskeletal:  No deformities, BUE and BLE strength normal and equal Skin: warm and dry  Neuro:  CNs 2-12 intact, no focal abnormalities noted Psych:  Normal affect   EKG:  The EKG was personally reviewed and demonstrates:  NSR, HR 62 with RBBB, no specific ST changes  Telemetry:  Telemetry was personally reviewed and  demonstrates:  NSR 70-80's  Relevant CV Studies:  [facility] 03/2019 Diagnostic Dominance: Right Left Main  Vessel is large. Vessel is angiographically normal.    Left Anterior Descending  Vessel is large.  Prox LAD to Mid LAD lesion is 30% stenosed. The lesion was previously treated using a stent (unknown type) over 2 years ago. Previously placed stent displays restenosis.  Dist LAD lesion is 75% stenosed.    First Diagonal Branch  Vessel is small in size.    Second Diagonal Branch  Vessel is moderate in size.  2nd Diag lesion is 75% stenosed.    Third Diagonal Branch  Vessel is small in size.    Left Circumflex  Prox Cx to Mid Cx lesion is 60% stenosed. The lesion is eccentric.  Mid Cx to Dist Cx lesion is 60% stenosed.    First Obtuse Marginal Branch  Vessel is small in size.    Second Obtuse Marginal Branch  Vessel is large in size.  Previously placed 2nd Mrg stent (unknown type) is widely patent.    Right Coronary Artery  Vessel is moderate in size.  Previously placed Ost RCA to Mid RCA stent (unknown type) is widely patent. Pressure  wire/FFR was not performed on the lesion.  Mid RCA to Dist RCA lesion is 70% stenosed.  Dist RCA lesion is 55% stenosed.    Right Posterior Descending Artery  Vessel is small in size. There is severe disease in the vessel.    Right Posterior Atrioventricular Artery  Vessel is small in size.    Intervention   No interventions have been documented.  Conclusions: Multivessel coronary artery disease, as detailed below.  There may have been slight interval progression in the mid/distal RCA disease, though otherwise the angiographic appearance of the coronary arteries is similar to the most recent study in 10/2015. Normal left ventricular systolic function with mildly elevated filling pressure.   Recommendations: Images reviewed with interventional team.  We agree that the distal RCA is diffusely disease and supplies relatively small/diseased PDA and PL branches; this is not an ideal target for PCI.  Other disease, the most severe of which affects the small/distal branches, has not changed significantly since 2017. Start isosorbide mononitrate 30 mg daily and wean nitroglycerin infusion, as tolerated.  Reserve PCI to RCA and/or LCx for refractory symptoms. Aggressive secondary prevention, including high-intensity statin therapy.  ECHO 02/03/2021 IMPRESSIONS  1. Left ventricular ejection fraction, by estimation, is 55%. The left  ventricle has normal function. The left ventricle demonstrates regional  wall motion abnormalities with basal to mid inferior hypokinesis. Left  ventricular diastolic parameters are  consistent with Grade I diastolic dysfunction (impaired relaxation).   2. Right ventricular systolic function is normal. The right ventricular  size is normal. Tricuspid regurgitation signal is inadequate for assessing  PA pressure.   3. The mitral valve is normal in structure. No evidence of mitral valve  regurgitation. No evidence of mitral stenosis.   4. The aortic valve is  tricuspid. Aortic valve regurgitation is not  visualized. Aortic valve sclerosis/calcification is present, without any  evidence of aortic stenosis.   5. The inferior vena cava is normal in size with greater than 50%  respiratory variability, suggesting right atrial pressure of 3 mmHg.   Comparison(s): 02/09/18 EF 50-55%.   Lexiscan 06/2022   The study is normal. The study is low risk.   No ST deviation was noted.   LV perfusion is normal. There is no evidence of ischemia. There is  no evidence of infarction.   Left ventricular function is normal. Nuclear stress EF: 62 %. The left ventricular ejection fraction is normal (55-65%). End diastolic cavity size is normal. End systolic cavity size is normal.   Prior study available for comparison from 08/17/2018.   Fixed perfusion defect at apex and apical inferior wall with normal wall motion, consistent with artifact Low risk study  Laboratory Data:  High Sensitivity Troponin:   Recent Labs  Lab 03/21/23 1837 03/21/23 2113  TROPONINIHS 7 5     Chemistry Recent Labs  Lab 03/21/23 1837 03/22/23 0642  NA 141 139  K 3.3* 3.0*  CL 102 105  CO2 25 28  GLUCOSE 78 124*  BUN <5* <5*  CREATININE 0.64 0.79  CALCIUM 9.0 8.1*  MG  --  1.7  GFRNONAA >60 >60  ANIONGAP 14 6    Recent Labs  Lab 03/22/23 0642  PROT 6.3*  ALBUMIN 2.4*  AST 15  ALT 9  ALKPHOS 91  BILITOT 0.6   Lipids  Recent Labs  Lab 03/22/23 0642  CHOL 119  TRIG 62  HDL 22*  LDLCALC 85  CHOLHDL 5.4    Hematology Recent Labs  Lab 03/21/23 1837 03/22/23 0642  WBC 8.7 9.0  RBC 5.22* 4.38  HGB 14.2 12.1  HCT 46.1* 38.5  MCV 88.3 87.9  MCH 27.2 27.6  MCHC 30.8 31.4  RDW 15.2 15.1  PLT 306 284   Thyroid  Recent Labs  Lab 03/22/23 0641 03/22/23 0642  TSH  --  0.067*  FREET4 1.16*  --     BNPNo results for input(s): "BNP", "PROBNP" in the last 168 hours.  DDimer No results for input(s): "DDIMER" in the last 168 hours.   Radiology/Studies:  DG  Chest 2 View Result Date: 03/21/2023 CLINICAL DATA:  Intermittent chest pain EXAM: CHEST - 2 VIEW COMPARISON:  CT chest dated 03/20/2023, chest radiograph dated 02/21/2023 FINDINGS: Patient is rotated to the right. Normal lung volumes. Superolateral right lung opacity, better evaluated on recent chest CT. Minimal adjacent interstitial opacities. No pleural effusion or pneumothorax. The heart size and mediastinal contours are within normal limits. No acute osseous abnormality. Partially imaged thoracolumbar spinal fixation hardware appears intact. Partially imaged aortic stent graft. IMPRESSION: Superolateral right lung opacity, better evaluated on recent chest CT. No new focal consolidations. Electronically Signed   By: Agustin Cree M.D.   On: 03/21/2023 19:57   CT Chest W Contrast Result Date: 03/20/2023 CLINICAL DATA:  Peripheral right upper lobe opacity which may be cavitary seen on chest radiographs dated 02/21/2023. Chest CT was recommended for further evaluation. EXAM: CT CHEST WITH CONTRAST TECHNIQUE: Multidetector CT imaging of the chest was performed during intravenous contrast administration. RADIATION DOSE REDUCTION: This exam was performed according to the departmental dose-optimization program which includes automated exposure control, adjustment of the mA and/or kV according to patient size and/or use of iterative reconstruction technique. CONTRAST:  75mL ISOVUE-300 IOPAMIDOL (ISOVUE-300) INJECTION 61% COMPARISON:  Chest radiographs dated 02/21/2023 and 04/03/2019. Chest CTA dated 06/11/2017. FINDINGS: Cardiovascular: Atheromatous calcifications, including the coronary arteries and aorta. Normal-sized heart. No pericardial effusion. Mediastinum/Nodes: Surgically absent left lobe thyroid and isthmus. Normal-appearing right lobe. Mildly enlarged right anterior paratracheal/precarinal node with a short axis diameter of 14 mm on image number 135/10 without significant change since 06/11/2017. No interval  enlarged lymph nodes elsewhere. Unremarkable trachea and esophagus. Lungs/Pleura: Irregular, pleural-based mass laterally in the right upper chest. This contains a minimal air or gas-filled cavitary component and  is predominantly low-density centrally, possibly necrotic or filled with fluid. This is difficult to assess due to extensive streak artifact from spinal hardware. This area measures 8.2 x 2.6 cm on image number 89/10. This measures 7.1 cm in length on coronal image number 97/4. No pleural fluid elsewhere on either side. There are patchy, linear and interstitial densities in the right upper lobe adjacent to and inferior to the pleural-based mass. Clear left lung.  No lung nodules seen. Upper Abdomen: Extensive streak artifacts from spine fixation hardware. Multiple small colonic diverticula. Oval area of low density in the left lobe of the liver measuring 4.2 x 3.3 cm on image number [id number]/10. This measures 19 Hounsfield units in density, including increased density associated with streak artifacts. This measured 2.7 x 2.1 cm and -4 Hounsfield units in density on 02/24/2017, compatible with a simple cyst, not requiring imaging follow-up. Musculoskeletal: Pedicle screw and rod fixation throughout the thoracic and lumbar spine from the upper thoracic spine down with associated extensive streak artifacts. Extensive thoracic and lower cervical spine degenerative changes. Grade 1 anterolisthesis in the lower thoracic spine with pedicle screw tracts compatible with removed screws. IMPRESSION: 1. 8.2 x 2.6 x 7.1 cm irregular, pleural-based mass laterally in the right upper chest with a minimal air or gas-filled cavitary component and predominantly low-density centrally, possibly necrotic or filled with fluid. This is difficult to assess due to extensive streak artifacts from spinal hardware. This could represent a necrotic malignancy or an infectious process with a empyema. 2. Patchy, linear and interstitial  densities in the right upper lobe adjacent to and inferior to the pleural-based mass, likely due to infection. This is more extensive and pronounced than expected with lymphangitic spread of tumor. 3. Stable mildly enlarged right anterior paratracheal/precarinal lymph node since 06/11/2017, compatible with a benign lymph node. 4.  Calcific coronary artery and aortic atherosclerosis. 5. Colonic diverticulosis. Aortic Atherosclerosis (ICD10-I70.0). Electronically Signed   By: Beckie Salts M.D.   On: 03/20/2023 17:37     Assessment and Plan:   Chest pain  CAD s/p PCI's  HLD - Presented to ED on 03/21/23 for intermittent CP x1 week. Similar to previous angina episodes. Relieved after 0.4 mg NTG x3 and 324 ASA. Pain is relieved with changes in position, both laying and sitting. Also, reported falling on right chest side approx 2 weeks ago and reports constant 10/10 pain. She does not relate right side chest pain to central chest pain. Denies any SOB, dizziness, diaphoresis.  - Initial Labs: K 3.3, BUN <5, Cr 0.64; HsTn 7>5 - initial EKG: NSR, HR 62 with RBBB, no specific ST changes  - CXR 03/21/23: Superolateral right lung opacity, better evaluated on recent chest CT. No new focal consolidations. - LDL 10/05/22: 107.  - She reported never starting Lipitor or Imdur. The only home cardiac medications includes NTG, ASA, and Atenolol, which she reports complaints.  - Last ECHO 01/2021: EF 55%. ECHO pending  - Pain meds given includes norco/vicodin and oxycodone were d/c. Pain did improve for chest pain but is still present.  - Currently on Atenolol 25 - Ordered Lipitor 40 mg, NTG paste - Given extensive CAD history and  medication noncompliance, cardiac cath is recommended. Currently scheduled for 03/24/23 - It is possible CP is 2/2 to right lung mass vs. Pneumonia. Will also follow pulmonary reports.   Hypokalemia - 3.3>3.0 treated w/ KCL - supplementing with Mg - will recheck in the am  Chronic  HFpEF  - on  prn lasix  - Last ECHO 01/2021: EF 55% - ECHO pending   HTN  - currently on atenolol 25 mg  - remains stable 134/64, will continue to monitor  History of Abdominal Aortic Aneurysm S/p EVAR 01/2016 - denies any abdominal pain   Otherwise managed by Primary: - Right lung mass vs pneumonia  - Hypothyroidism  - DM    Risk Assessment/Risk Scores:  [id number]}   TIMI Risk Score for Unstable Angina or Non-ST Elevation MI:   The patient's TIMI risk score is 5, which indicates a 26% risk of all cause mortality, new or recurrent myocardial infarction or need for urgent revascularization in the next 14 days.     For questions or updates, please contact [facility] Please consult [email or web address] for contact info under    Signed, Basilio Cairo, PA-C  03/22/2023 3:08 PM    Patient seen and examined. Agree with assessment and plan.  [name] is a 77 year old American female who is followed by Dr. Tonny Bollman.  She has history of significant coronary artery disease dating back to 1998 when she suffered an inferior MI and stenting to her RCA.  Subsequently, she has undergone stenting to her left circumflex in 2012, LAD 2015, and in 2017 underwent DES x 3 to the RCA.  Her last catheterization in March 2021 showed diffuse coronary disease not ideal for intervention for which medical therapy was recommended.  She has a history of hypertension, diabetes mellitus, hyperlipidemia, and she status post EVAR in 2018 for abdominal aortic aneurysm.  Her last stress test was in June 2024 which showed normal findings with normal perfusion and gated LVEF at 62%.  Se recently saw Dr. Excell Seltzer on March 02, 2023.  That time, she was describing intermittent central and right-sided chest discomfort that felt like an ache.  At times it occurred more often postprandially and was relieved by belching.  She was uncertain if this was like her prior anginal symptoms.  Presently, she tells me  she had fallen 2 weeks ago and sustained some chest wall trauma particularly to the right chest wall area anteriorly.  This is more of a constant musculoskeletal type chest pain and she also notes some central chest sensation.  However when she presses the center of her chest this seems to improve her symptomatology.  Presently, she is undergoing a 2D echo Doppler study.  Blood pressure is 34/64.  Temperature is 99.5.  She definitely has chest wall tenderness to palpation.'s are without wheezing.  Abdomen is nontender.  There is no significant edema.  CT imaging has demonstrated a right lung opacity with 8.2 x 2.6 x 7.1 cm regular pleural-based mass laterally in the right upper chest.She is empirically started on ceftriaxone and azithromycin.  She has a remote tobacco history.  IR has been consulted for CT-guided biopsy.  Patient's ECG shows a low atrial rhythm with right bundle branch block and left anterior hemiblock.  Presently, I am not convinced that the patient is having progressive anginal symptomatology.  She has extensive chest pain involving the chest wall most likely contributed by her recent fall several weeks ago.  He send lipid panel showed total cholesterol 119, HDL 22, tried 62 and LDL at 85.  Currently she is on atenolol 25 mg and was started back on atorvastatin 40 mg in addition to levothyroxine is now on nitroglycerin ointment.  TSH is over suppressed at 0.67; will need to reduce thyroid supplementation.  Potassium is low at 3.0,  needs supplementation.  With her known multivessel CAD consider initiation of low-dose amlodipine 2.5 mg which will be helpful both for blood pressure control and potential anti-ischemic benefit.  At present, will not schedule for definitive repeat catheterization but will reevaluate during admission.   Lennette Bihari, MD, [facility] 03/22/2023 3:30 PM

## 2023-03-23 ENCOUNTER — Inpatient Hospital Stay (HOSPITAL_COMMUNITY)

## 2023-03-23 DIAGNOSIS — J189 Pneumonia, unspecified organism: Secondary | ICD-10-CM | POA: Diagnosis not present

## 2023-03-23 DIAGNOSIS — E782 Mixed hyperlipidemia: Secondary | ICD-10-CM | POA: Diagnosis not present

## 2023-03-23 DIAGNOSIS — R9389 Abnormal findings on diagnostic imaging of other specified body structures: Secondary | ICD-10-CM | POA: Diagnosis not present

## 2023-03-23 DIAGNOSIS — I25119 Atherosclerotic heart disease of native coronary artery with unspecified angina pectoris: Secondary | ICD-10-CM | POA: Diagnosis not present

## 2023-03-23 DIAGNOSIS — R918 Other nonspecific abnormal finding of lung field: Secondary | ICD-10-CM | POA: Diagnosis not present

## 2023-03-23 LAB — BASIC METABOLIC PANEL
Anion gap: 8 (ref 5–15)
BUN: 5 mg/dL — ABNORMAL LOW (ref 8–23)
CO2: 26 mmol/L (ref 22–32)
Calcium: 8 mg/dL — ABNORMAL LOW (ref 8.9–10.3)
Chloride: 103 mmol/L (ref 98–111)
Creatinine, Ser: 0.84 mg/dL (ref 0.44–1.00)
GFR, Estimated: >60 mL/min (ref >60)
Glucose, Bld: 87 mg/dL (ref 70–99)
Potassium: 3.6 mmol/L (ref 3.5–5.1)
Sodium: 137 mmol/L (ref 135–145)

## 2023-03-23 LAB — CBC
HCT: 38.4 % (ref 36.0–46.0)
Hemoglobin: 12.2 g/dL (ref 12.0–15.0)
MCH: 27.2 pg (ref 26.0–34.0)
MCHC: 31.8 g/dL (ref 30.0–36.0)
MCV: 85.7 fL (ref 80.0–100.0)
Platelets: 290 10*3/uL (ref 150–400)
RBC: 4.48 MIL/uL (ref 3.87–5.11)
RDW: 14.9 % (ref 11.5–15.5)
WBC: 9.6 10*3/uL (ref 4.0–10.5)
nRBC: 0 % (ref 0.0–0.2)

## 2023-03-23 LAB — MAGNESIUM: Magnesium: 2 mg/dL (ref 1.7–2.4)

## 2023-03-23 MED ORDER — HYDROCODONE-ACETAMINOPHEN 5-325 MG PO TABS
1.0000 | ORAL_TABLET | Freq: Four times a day (QID) | ORAL | Status: DC | PRN
  Administered 2023-03-23 – 2023-03-27 (×13): 1 via ORAL
  Filled 2023-03-23 (×13): qty 1

## 2023-03-23 NOTE — Progress Notes (Signed)
 PROGRESS NOTE [name]  ZOX:[id number] DOB: 12/28/1945 DOA: 03/21/2023 PCP: Corwin Levins, MD  Brief Narrative/Hospital Course: 78 yof w/ hx of CAD s/p multiple PCI's, diastolic CHF, diabetes mellitus presently not on meds, hypothyroidism presented to the ED with complaint of chest pain on and off x 3 wks, and found to have  RUL lung lesion in outpatient CT chest and concerning for necrotizing neoplastic lesion versus pneumonia.Patient states she also had a fall and hit her right side of the chest following which she had more chest pain.  In the ED vitals stable with labs unremarkable troponin negative x 2 lactic acid normal and patient was admitted for further management of chest pain and lung mass workup.  Procedure: CT Chest 3/3> 8.2 x 2.6 x 7.1 cm irregular, pleural-based mass laterally in the right upper chest with a minimal air or gas-filled cavitary component and predominantly low-density centrally, possibly necrotic or filled with fluid-necrotic malignancy or infectious process with empyema.  Stable paratracheal/precarinal lymph nodes calcific aortic and coronary atherosclerosis.  Consultation: Cardiology PCCM IR  Subjective: Seen and examined this morning Resting comfortably Having some right-sided chest pain   Assessment and Plan: Principal Problem:   Lung mass Active Problems:   Hypothyroidism   Diabetes (HCC)   Mixed hyperlipidemia   Essential hypertension   Abdominal aortic aneurysm (AAA) without rupture   Coronary artery disease   Chronic heart failure with preserved ejection fraction (HFpEF) (HCC)   Right-sided chest pain   Hyperlipidemia with target low density lipoprotein (LDL) cholesterol less than 55 mg/dL   Hypokalemia   Chest pain x 3 weeks RUL Lung mass Possible pneumonia: Significant mass noticed on outpatient CT chest: Differential includes lung mass/malignancy/infarct/pneumonia-but no classical infectious symptoms or signs.  Keeping on empiric  antibiotics.  Pulmonary consulted input appreciated planning for CT-guided biopsy, due to aspirin use on 3/4 biopsy planned for next Monday as OP or IP.Per PCCM difficult to bronc due to peripheral lesion.  Patient interested to get biopsy as inpatient BUT we will decide tomorrow. Cont empiric ceftriaxone and azithromycin.    Diastolic CHF: Compensated on admission. PTA on prn lasix.   CAD s/p stents Chest pain: Significant cardiac history and having chest pain cardiology input appreciated.  Noted plan for cardiac catheter/7.  Holding aspirin for lung mass biopsy.  Continue atenolol, Lipitor, nitroglycerin ointment. TTE 3/5-showed EF 50-55% no RWMA RV SF normal RV size normal.   Hypothyroidism: cont home Synthroid.  TSH low at 0.06 and FT4 elevated 1.16 likely iatrogenic Synthroid dose decreased -rechekc tsh/ft4 in 3 wk  AAA hx: No abdominal pain currently.   Hypokalemia: Replaced 3/5 recheck pending   DVT prophylaxis: SCDs Start: 03/22/23 0504 Code Status:   Code Status: Full Code Family Communication: plan of care discussed with patient/none at bedside. Patient status is: Remains hospitalized because of severity of illness Level of care: Telemetry Medical   Dispo: The patient is from: Home lives alone            Anticipated disposition: TBD Objective: Vitals last 24 hrs: Vitals:   03/23/23 0109 03/23/23 0515 03/23/23 0850 03/23/23 0941  BP: (!) 90/46 (!) 101/53 105/60 (!) 116/53  Pulse: 71 66 64 72  Resp: 18 17 18    Temp: 98.4 F (36.9 C) 98.7 F (37.1 C) 98 F (36.7 C)   TempSrc: Oral Oral Oral   SpO2: 100% 100% 100% 100%  Weight:      Height:       Weight change:  Physical Examination: General exam: alert awake,at baseline, older than stated age HEENT:Oral mucosa moist, Ear/Nose WNL grossly Respiratory system: Bilaterally diminished basal BS,no use of accessory muscle Cardiovascular system: S1 & S2 +, No JVD. Gastrointestinal system: Abdomen soft,NT,ND,  BS+ Nervous System: Alert, awake, moving all extremities,and following commands. Extremities: LE edema neg,distal peripheral pulses palpable and warm.  Skin: No rashes,no icterus. MSK: Normal muscle bulk,tone, power   Medications reviewed:  Scheduled Meds:  amLODipine  2.5 mg Oral Daily   atenolol  25 mg Oral Daily   atorvastatin  40 mg Oral Daily   azithromycin  500 mg Oral QHS   levothyroxine  100 mcg Oral QAC breakfast   nitroGLYCERIN  0.5 inch Topical Q6H   Continuous Infusions:  cefTRIAXone (ROCEPHIN)  IV 2 g (03/22/23 2051)     Diet Order             Diet regular Room service appropriate? Yes; Fluid consistency: Thin  Diet effective now                  Intake/Output Summary (Last 24 hours) at 03/23/2023 1047 Last data filed at 03/23/2023 0333 Gross per 24 hour  Intake 100 ml  Output --  Net 100 ml   Net IO Since Admission: 443.52 mL [03/23/23 1047]  Wt Readings from Last 3 Encounters:  03/21/23 79.8 kg  03/02/23 79.9 kg  02/21/23 79.4 kg     Unresulted Labs (From admission, onward)     Start     Ordered   03/23/23 0500  Basic metabolic panel  Daily,   R      03/22/23 1117   03/23/23 0500  CBC  Daily,   R      03/22/23 1117   03/22/23 0507  Expectorated Sputum Assessment w Gram Stain, Rflx to Resp Cult  Once,   R        03/22/23 0507   03/21/23 1915  Blood culture (routine x 2)  BLOOD CULTURE X 2,   R      03/21/23 1914          Data Reviewed: I have personally reviewed following labs and imaging studies CBC: Recent Labs  Lab 03/21/23 1837 03/22/23 0642 03/23/23 0743  WBC 8.7 9.0 9.6  NEUTROABS  --  5.8  --   HGB 14.2 12.1 12.2  HCT 46.1* 38.5 38.4  MCV 88.3 87.9 85.7  PLT 306 284 290   Basic Metabolic Panel:  Recent Labs  Lab 03/21/23 1837 03/22/23 0642 03/23/23 0743  NA 141 139 137  K 3.3* 3.0* 3.6  CL 102 105 103  CO2 25 28 26   GLUCOSE 78 124* 87  BUN <5* <5* 5*  CREATININE 0.64 0.79 0.84  CALCIUM 9.0 8.1* 8.0*  MG  --  1.7   --    GFR: Estimated Creatinine Clearance: 59.8 mL/min (by C-G formula based on SCr of 0.84 mg/dL). Liver Function Tests:  Recent Labs  Lab 03/22/23 0642  AST 15  ALT 9  ALKPHOS 91  BILITOT 0.6  PROT 6.3*  ALBUMIN 2.4*   Recent Labs    03/22/23 0642  CHOL 119  HDL 22*  LDLCALC 85  TRIG 62  CHOLHDL 5.4   Recent Labs    03/22/23 0641 03/22/23 0642  TSH  --  0.067*  FREET4 1.16*  --    Sepsis Labs: Recent Labs  Lab 03/21/23 2113 03/22/23 0056  LATICACIDVEN 1.0 0.8   Recent Results (from the past  240 hours)  Blood culture (routine x 2)     Status: None (Preliminary result)   Collection Time: 03/21/23  9:13 PM   Specimen: BLOOD  Result Value Ref Range Status   Specimen Description BLOOD RIGHT ANTECUBITAL  Final   Special Requests   Final    BOTTLES DRAWN AEROBIC AND ANAEROBIC Blood Culture results may not be optimal due to an inadequate volume of blood received in culture bottles   Culture   Final    NO GROWTH < 12 HOURS Performed at [facility], [address], [place], [place] [address]    Report Status PENDING  Incomplete    Antimicrobials/Microbiology: Anti-infectives (From admission, onward)    Start     Dose/Rate Route Frequency Ordered Stop   03/22/23 2200  cefTRIAXone (ROCEPHIN) 2 g in sodium chloride 0.9 % 100 mL IVPB        2 g 200 mL/hr over 30 Minutes Intravenous Every 24 hours 03/22/23 0507 03/27/23 2159   03/22/23 2200  azithromycin (ZITHROMAX) 500 mg in sodium chloride 0.9 % 250 mL IVPB  Status:  Discontinued        500 mg 250 mL/hr over 60 Minutes Intravenous Every 24 hours 03/22/23 0507 03/22/23 1611   03/22/23 2200  azithromycin (ZITHROMAX) tablet 500 mg        500 mg Oral Daily at bedtime 03/22/23 1611 03/26/23 2159   03/21/23 1930  cefTRIAXone (ROCEPHIN) 2 g in sodium chloride 0.9 % 100 mL IVPB        2 g 200 mL/hr over 30 Minutes Intravenous  Once 03/21/23 1916 03/21/23 2150   03/21/23 1930  azithromycin (ZITHROMAX) 500 mg in  sodium chloride 0.9 % 250 mL IVPB        500 mg 250 mL/hr over 60 Minutes Intravenous  Once 03/21/23 1916 03/21/23 2339         Component Value Date/Time   SDES BLOOD RIGHT ANTECUBITAL 03/21/2023 2113   SPECREQUEST  03/21/2023 2113    BOTTLES DRAWN AEROBIC AND ANAEROBIC Blood Culture results may not be optimal due to an inadequate volume of blood received in culture bottles   CULT  03/21/2023 2113    NO GROWTH < 12 HOURS Performed at [facility], [address] [place], [place] [address]    REPTSTATUS PENDING 03/21/2023 2113     Radiology Studies: ECHOCARDIOGRAM COMPLETE Result Date: 03/22/2023    ECHOCARDIOGRAM REPORT   Patient Name:   CAMIA [name] Date of Exam: 03/22/2023 Medical Rec #:  [id number]      Height:       66.0 in Accession #:    [id number]     Weight:       176.0 lb Date of Birth:  1945/12/10       BSA:          1.894 m Patient Age:    77 years       BP:           134/64 mmHg Patient Gender: F              HR:           68 bpm. Exam Location:  Inpatient Procedure: 2D Echo, Cardiac Doppler and Color Doppler (Both Spectral and Color            Flow Doppler were utilized during procedure). Indications:    Chest Pain  History:        Patient has prior history  of Echocardiogram examinations, most                 recent 02/03/2021. CAD; AAA.  Sonographer:    Amy Chionchio Referring Phys: Midge Minium, N IMPRESSIONS  1. Abnormal septal motion Strain and 3D EF not performed. Left ventricular ejection fraction, by estimation, is 50 to 55%. The left ventricle has low normal function. The left ventricle has no regional wall motion abnormalities. Left ventricular diastolic parameters were normal.  2. Right ventricular systolic function is normal. The right ventricular size is normal.  3. The mitral valve is abnormal. Trivial mitral valve regurgitation. No evidence of mitral stenosis.  4. The aortic valve is tricuspid. There is moderate calcification of the aortic valve. There is  moderate thickening of the aortic valve. Aortic valve regurgitation is not visualized. Aortic valve sclerosis/calcification is present, without any evidence of aortic stenosis.  5. The inferior vena cava is normal in size with greater than 50% respiratory variability, suggesting right atrial pressure of 3 mmHg. FINDINGS  Left Ventricle: Abnormal septal motion Strain and 3D EF not performed. Left ventricular ejection fraction, by estimation, is 50 to 55%. The left ventricle has low normal function. The left ventricle has no regional wall motion abnormalities. Strain was performed and the global longitudinal strain is indeterminate. The left ventricular internal cavity size was normal in size. There is no left ventricular hypertrophy. Left ventricular diastolic parameters were normal. Right Ventricle: The right ventricular size is normal. No increase in right ventricular wall thickness. Right ventricular systolic function is normal. Left Atrium: Left atrial size was normal in size. Right Atrium: Right atrial size was normal in size. Pericardium: There is no evidence of pericardial effusion. Mitral Valve: The mitral valve is abnormal. There is mild thickening of the mitral valve leaflet(s). There is mild calcification of the mitral valve leaflet(s). Mild mitral annular calcification. Trivial mitral valve regurgitation. No evidence of mitral valve stenosis. MV peak gradient, 5.3 mmHg. The mean mitral valve gradient is 2.0 mmHg. Tricuspid Valve: The tricuspid valve is normal in structure. Tricuspid valve regurgitation is mild . No evidence of tricuspid stenosis. Aortic Valve: The aortic valve is tricuspid. There is moderate calcification of the aortic valve. There is moderate thickening of the aortic valve. Aortic valve regurgitation is not visualized. Aortic valve sclerosis/calcification is present, without any  evidence of aortic stenosis. Aortic valve mean gradient measures 4.0 mmHg. Aortic valve peak gradient  measures 6.7 mmHg. Aortic valve area, by VTI measures 2.31 cm. Pulmonic Valve: The pulmonic valve was normal in structure. Pulmonic valve regurgitation is not visualized. No evidence of pulmonic stenosis. Aorta: The aortic root is normal in size and structure. Venous: The inferior vena cava is normal in size with greater than 50% respiratory variability, suggesting right atrial pressure of 3 mmHg. IAS/Shunts: No atrial level shunt detected by color flow Doppler. Additional Comments: 3D was performed not requiring image post processing on an independent workstation and was indeterminate.  LEFT VENTRICLE PLAX 2D LVIDd:         4.90 cm      Diastology LVIDs:         3.10 cm      LV e' medial:    4.57 cm/s LV PW:         0.80 cm      LV E/e' medial:  17.2 LV IVS:        0.80 cm      LV e' lateral:   9.36 cm/s LVOT diam:  2.00 cm      LV E/e' lateral: 8.4 LV SV:         60 LV SV Index:   32 LVOT Area:     3.14 cm  LV Volumes (MOD) LV vol d, MOD A2C: 105.0 ml LV vol d, MOD A4C: 91.8 ml LV vol s, MOD A4C: 42.4 ml LV SV MOD A4C:     91.8 ml RIGHT VENTRICLE            IVC RV Basal diam:  3.50 cm    IVC diam: 1.40 cm RV S prime:     8.05 cm/s TAPSE (M-mode): 1.5 cm LEFT ATRIUM             Index        RIGHT ATRIUM           Index LA Vol (A2C):   54.8 ml 28.93 ml/m  RA Area:     12.70 cm LA Vol (A4C):   67.0 ml 35.37 ml/m  RA Volume:   30.40 ml  16.05 ml/m LA Biplane Vol: 61.6 ml 32.52 ml/m  AORTIC VALVE                    PULMONIC VALVE AV Area (Vmax):    2.31 cm     PV Vmax:       0.62 m/s AV Area (Vmean):   2.36 cm     PV Peak grad:  1.5 mmHg AV Area (VTI):     2.31 cm AV Vmax:           129.00 cm/s AV Vmean:          88.000 cm/s AV VTI:            0.260 m AV Peak Grad:      6.7 mmHg AV Mean Grad:      4.0 mmHg LVOT Vmax:         95.00 cm/s LVOT Vmean:        66.100 cm/s LVOT VTI:          0.191 m LVOT/AV VTI ratio: 0.73  AORTA Ao Root diam: 2.60 cm Ao Asc diam:  2.70 cm MITRAL VALVE MV Area (PHT): 3.10 cm     SHUNTS MV Area VTI:   1.92 cm    Systemic VTI:  0.19 m MV Peak grad:  5.3 mmHg    Systemic Diam: 2.00 cm MV Mean grad:  2.0 mmHg MV Vmax:       1.15 m/s MV Vmean:      69.5 cm/s MV Decel Time: 245 msec MV E velocity: 78.60 cm/s MV A velocity: 89.60 cm/s MV E/A ratio:  0.88 Charlton Haws MD Electronically signed by Charlton Haws MD Signature Date/Time: 03/22/2023/4:35:38 PM    Final    DG Chest 2 View Result Date: 03/21/2023 CLINICAL DATA:  Intermittent chest pain EXAM: CHEST - 2 VIEW COMPARISON:  CT chest dated 03/20/2023, chest radiograph dated 02/21/2023 FINDINGS: Patient is rotated to the right. Normal lung volumes. Superolateral right lung opacity, better evaluated on recent chest CT. Minimal adjacent interstitial opacities. No pleural effusion or pneumothorax. The heart size and mediastinal contours are within normal limits. No acute osseous abnormality. Partially imaged thoracolumbar spinal fixation hardware appears intact. Partially imaged aortic stent graft. IMPRESSION: Superolateral right lung opacity, better evaluated on recent chest CT. No new focal consolidations. Electronically Signed   By: Agustin Cree M.D.   On: 03/21/2023 19:57     LOS:  2 days   Total time spent in review of labs and imaging, patient evaluation, formulation of plan, documentation and communication with family: 35 minutes  Lanae Boast, MD  [facility]  03/23/2023, 10:47 AM

## 2023-03-23 NOTE — Plan of Care (Signed)
  Problem: Education: Goal: Knowledge of General Education information will improve Description Including pain rating scale, medication(s)/side effects and non-pharmacologic comfort measures Outcome: Progressing   Problem: Clinical Measurements: Goal: Respiratory complications will improve Outcome: Progressing   Problem: Activity: Goal: Risk for activity intolerance will decrease Outcome: Progressing   Problem: Nutrition: Goal: Adequate nutrition will be maintained Outcome: Progressing   Problem: Coping: Goal: Level of anxiety will decrease Outcome: Progressing   Problem: Elimination: Goal: Will not experience complications related to bowel motility Outcome: Progressing Goal: Will not experience complications related to urinary retention Outcome: Progressing   Problem: Safety: Goal: Ability to remain free from injury will improve Outcome: Progressing   Problem: Skin Integrity: Goal: Risk for impaired skin integrity will decrease Outcome: Progressing   

## 2023-03-23 NOTE — Progress Notes (Signed)
 Mobility Specialist: Progress Note   03/23/23 1618  Mobility  Activity Ambulated with assistance to bathroom  Level of Assistance Standby assist, set-up cues, supervision of patient - no hands on  Assistive Device None;Other (Comment) (furtinure surfing)  Distance Ambulated (ft) 30 ft  Activity Response Tolerated well  Mobility Referral Yes  Mobility visit 1 Mobility  Mobility Specialist Start Time (ACUTE ONLY) 0401  Mobility Specialist Stop Time (ACUTE ONLY) 0407  Mobility Specialist Time Calculation (min) (ACUTE ONLY) 6 min    Pt requesting to use BR - received in bed. Close SV throughout d/t unsteadiness. Declined RW, furniture surfing for UE support. Ambulated to the BR and back without fault. Left in bed with all needs met, call bell in reach. Bed alarm on.   Maurene Capes Mobility Specialist Please contact via SecureChat or Rehab office at [phone]

## 2023-03-23 NOTE — Progress Notes (Addendum)
 Rounding Note    Patient Name: [name] Date of Encounter: 03/23/2023  [facility] Cardiologist: Tonny Bollman, MD   Subjective   Pt admits to some improvement with chest wall pain but relates most of the relief to pain medications and not NTG. Denies any SOB or weakness.   Inpatient Medications    Scheduled Meds:  amLODipine  2.5 mg Oral Daily   atenolol  25 mg Oral Daily   atorvastatin  40 mg Oral Daily   azithromycin  500 mg Oral QHS   levothyroxine  100 mcg Oral QAC breakfast   nitroGLYCERIN  0.5 inch Topical Q6H   Continuous Infusions:  cefTRIAXone (ROCEPHIN)  IV 2 g (03/22/23 2051)   PRN Meds: HYDROcodone-acetaminophen, nitroGLYCERIN   Vital Signs    Vitals:   03/23/23 0109 03/23/23 0515 03/23/23 0850 03/23/23 0941  BP: (!) 90/46 (!) 101/53 105/60 (!) 116/53  Pulse: 71 66 64 72  Resp: 18 17 18    Temp: 98.4 F (36.9 C) 98.7 F (37.1 C) 98 F (36.7 C)   TempSrc: Oral Oral Oral   SpO2: 100% 100% 100% 100%  Weight:      Height:        Intake/Output Summary (Last 24 hours) at 03/23/2023 1118 Last data filed at 03/23/2023 0333 Gross per 24 hour  Intake 100 ml  Output --  Net 100 ml      03/21/2023    5:33 PM 03/02/2023    9:53 AM 02/21/2023    1:23 PM  Last 3 Weights  Weight (lbs) 176 lb 176 lb 3.2 oz 175 lb  Weight (kg) 79.833 kg 79.924 kg 79.379 kg      Telemetry    NSR 60-70's - Personally Reviewed  ECG    None new - Personally Reviewed  Physical Exam   GEN: Laying in bed with no acute distress.   Neck: No JVD Cardiac: RRR, no murmurs, rubs, or gallops; 2 + radial pulse Respiratory: Clear to auscultation bilaterally. GI: Soft, nontender, non-distended  MS: No edema; No deformity. Neuro:  Nonfocal  Psych: Normal affect   Labs    High Sensitivity Troponin:   Recent Labs  Lab 03/21/23 1837 03/21/23 2113  TROPONINIHS 7 5     Chemistry Recent Labs  Lab 03/21/23 1837 03/22/23 0642 03/23/23 0743  NA 141 139 137  K  3.3* 3.0* 3.6  CL 102 105 103  CO2 25 28 26   GLUCOSE 78 124* 87  BUN <5* <5* 5*  CREATININE 0.64 0.79 0.84  CALCIUM 9.0 8.1* 8.0*  MG  --  1.7  --   PROT  --  6.3*  --   ALBUMIN  --  2.4*  --   AST  --  15  --   ALT  --  9  --   ALKPHOS  --  91  --   BILITOT  --  0.6  --   GFRNONAA >60 >60 >60  ANIONGAP 14 6 8     Lipids  Recent Labs  Lab 03/22/23 0642  CHOL 119  TRIG 62  HDL 22*  LDLCALC 85  CHOLHDL 5.4    Hematology Recent Labs  Lab 03/21/23 1837 03/22/23 0642 03/23/23 0743  WBC 8.7 9.0 9.6  RBC 5.22* 4.38 4.48  HGB 14.2 12.1 12.2  HCT 46.1* 38.5 38.4  MCV 88.3 87.9 85.7  MCH 27.2 27.6 27.2  MCHC 30.8 31.4 31.8  RDW 15.2 15.1 14.9  PLT 306 284 290  Thyroid  Recent Labs  Lab 03/22/23 0641 03/22/23 0642  TSH  --  0.067*  FREET4 1.16*  --     BNPNo results for input(s): "BNP", "PROBNP" in the last 168 hours.  DDimer No results for input(s): "DDIMER" in the last 168 hours.   Radiology    ECHOCARDIOGRAM COMPLETE Result Date: 03/22/2023    ECHOCARDIOGRAM REPORT   Patient Name:   [name] Date of Exam: 03/22/2023 Medical Rec #:  [id number]      Height:       66.0 in Accession #:    [id number]     Weight:       176.0 lb Date of Birth:  03-Apr-1945       BSA:          1.894 m Patient Age:    78 years       BP:           134/64 mmHg Patient Gender: F              HR:           68 bpm. Exam Location:  Inpatient Procedure: 2D Echo, Cardiac Doppler and Color Doppler (Both Spectral and Color            Flow Doppler were utilized during procedure). Indications:    Chest Pain  History:        Patient has prior history of Echocardiogram examinations, most                 recent 02/03/2021. CAD; AAA.  Sonographer:    Amy Chionchio Referring Phys: Midge Minium, N IMPRESSIONS  1. Abnormal septal motion Strain and 3D EF not performed. Left ventricular ejection fraction, by estimation, is 50 to 55%. The left ventricle has low normal function. The left ventricle has no  regional wall motion abnormalities. Left ventricular diastolic parameters were normal.  2. Right ventricular systolic function is normal. The right ventricular size is normal.  3. The mitral valve is abnormal. Trivial mitral valve regurgitation. No evidence of mitral stenosis.  4. The aortic valve is tricuspid. There is moderate calcification of the aortic valve. There is moderate thickening of the aortic valve. Aortic valve regurgitation is not visualized. Aortic valve sclerosis/calcification is present, without any evidence of aortic stenosis.  5. The inferior vena cava is normal in size with greater than 50% respiratory variability, suggesting right atrial pressure of 3 mmHg. FINDINGS  Left Ventricle: Abnormal septal motion Strain and 3D EF not performed. Left ventricular ejection fraction, by estimation, is 50 to 55%. The left ventricle has low normal function. The left ventricle has no regional wall motion abnormalities. Strain was performed and the global longitudinal strain is indeterminate. The left ventricular internal cavity size was normal in size. There is no left ventricular hypertrophy. Left ventricular diastolic parameters were normal. Right Ventricle: The right ventricular size is normal. No increase in right ventricular wall thickness. Right ventricular systolic function is normal. Left Atrium: Left atrial size was normal in size. Right Atrium: Right atrial size was normal in size. Pericardium: There is no evidence of pericardial effusion. Mitral Valve: The mitral valve is abnormal. There is mild thickening of the mitral valve leaflet(s). There is mild calcification of the mitral valve leaflet(s). Mild mitral annular calcification. Trivial mitral valve regurgitation. No evidence of mitral valve stenosis. MV peak gradient, 5.3 mmHg. The mean mitral valve gradient is 2.0 mmHg. Tricuspid Valve: The tricuspid valve is normal in structure. Tricuspid valve  regurgitation is mild . No evidence of tricuspid  stenosis. Aortic Valve: The aortic valve is tricuspid. There is moderate calcification of the aortic valve. There is moderate thickening of the aortic valve. Aortic valve regurgitation is not visualized. Aortic valve sclerosis/calcification is present, without any  evidence of aortic stenosis. Aortic valve mean gradient measures 4.0 mmHg. Aortic valve peak gradient measures 6.7 mmHg. Aortic valve area, by VTI measures 2.31 cm. Pulmonic Valve: The pulmonic valve was normal in structure. Pulmonic valve regurgitation is not visualized. No evidence of pulmonic stenosis. Aorta: The aortic root is normal in size and structure. Venous: The inferior vena cava is normal in size with greater than 50% respiratory variability, suggesting right atrial pressure of 3 mmHg. IAS/Shunts: No atrial level shunt detected by color flow Doppler. Additional Comments: 3D was performed not requiring image post processing on an independent workstation and was indeterminate.  LEFT VENTRICLE PLAX 2D LVIDd:         4.90 cm      Diastology LVIDs:         3.10 cm      LV e' medial:    4.57 cm/s LV PW:         0.80 cm      LV E/e' medial:  17.2 LV IVS:        0.80 cm      LV e' lateral:   9.36 cm/s LVOT diam:     2.00 cm      LV E/e' lateral: 8.4 LV SV:         60 LV SV Index:   32 LVOT Area:     3.14 cm  LV Volumes (MOD) LV vol d, MOD A2C: 105.0 ml LV vol d, MOD A4C: 91.8 ml LV vol s, MOD A4C: 42.4 ml LV SV MOD A4C:     91.8 ml RIGHT VENTRICLE            IVC RV Basal diam:  3.50 cm    IVC diam: 1.40 cm RV S prime:     8.05 cm/s TAPSE (M-mode): 1.5 cm LEFT ATRIUM             Index        RIGHT ATRIUM           Index LA Vol (A2C):   54.8 ml 28.93 ml/m  RA Area:     12.70 cm LA Vol (A4C):   67.0 ml 35.37 ml/m  RA Volume:   30.40 ml  16.05 ml/m LA Biplane Vol: 61.6 ml 32.52 ml/m  AORTIC VALVE                    PULMONIC VALVE AV Area (Vmax):    2.31 cm     PV Vmax:       0.62 m/s AV Area (Vmean):   2.36 cm     PV Peak grad:  1.5 mmHg AV Area  (VTI):     2.31 cm AV Vmax:           129.00 cm/s AV Vmean:          88.000 cm/s AV VTI:            0.260 m AV Peak Grad:      6.7 mmHg AV Mean Grad:      4.0 mmHg LVOT Vmax:         95.00 cm/s LVOT Vmean:        66.100 cm/s LVOT VTI:  0.191 m LVOT/AV VTI ratio: 0.73  AORTA Ao Root diam: 2.60 cm Ao Asc diam:  2.70 cm MITRAL VALVE MV Area (PHT): 3.10 cm    SHUNTS MV Area VTI:   1.92 cm    Systemic VTI:  0.19 m MV Peak grad:  5.3 mmHg    Systemic Diam: 2.00 cm MV Mean grad:  2.0 mmHg MV Vmax:       1.15 m/s MV Vmean:      69.5 cm/s MV Decel Time: 245 msec MV E velocity: 78.60 cm/s MV A velocity: 89.60 cm/s MV E/A ratio:  0.88 Charlton Haws MD Electronically signed by Charlton Haws MD Signature Date/Time: 03/22/2023/4:35:38 PM    Final    DG Chest 2 View Result Date: 03/21/2023 CLINICAL DATA:  Intermittent chest pain EXAM: CHEST - 2 VIEW COMPARISON:  CT chest dated 03/20/2023, chest radiograph dated 02/21/2023 FINDINGS: Patient is rotated to the right. Normal lung volumes. Superolateral right lung opacity, better evaluated on recent chest CT. Minimal adjacent interstitial opacities. No pleural effusion or pneumothorax. The heart size and mediastinal contours are within normal limits. No acute osseous abnormality. Partially imaged thoracolumbar spinal fixation hardware appears intact. Partially imaged aortic stent graft. IMPRESSION: Superolateral right lung opacity, better evaluated on recent chest CT. No new focal consolidations. Electronically Signed   By: Agustin Cree M.D.   On: 03/21/2023 19:57    Cardiac Studies   ECHO 02/03/2021 IMPRESSIONS  1. Left ventricular ejection fraction, by estimation, is 55%. The left  ventricle has normal function. The left ventricle demonstrates regional  wall motion abnormalities with basal to mid inferior hypokinesis. Left  ventricular diastolic parameters are  consistent with Grade I diastolic dysfunction (impaired relaxation).   2. Right ventricular systolic  function is normal. The right ventricular  size is normal. Tricuspid regurgitation signal is inadequate for assessing  PA pressure.   3. The mitral valve is normal in structure. No evidence of mitral valve  regurgitation. No evidence of mitral stenosis.   4. The aortic valve is tricuspid. Aortic valve regurgitation is not  visualized. Aortic valve sclerosis/calcification is present, without any  evidence of aortic stenosis.   5. The inferior vena cava is normal in size with greater than 50%  respiratory variability, suggesting right atrial pressure of 3 mmHg.   Comparison(s): 02/09/18 EF 50-55%.    Lexiscan 06/2022   The study is normal. The study is low risk.   No ST deviation was noted.   LV perfusion is normal. There is no evidence of ischemia. There is no evidence of infarction.   Left ventricular function is normal. Nuclear stress EF: 62 %. The left ventricular ejection fraction is normal (55-65%). End diastolic cavity size is normal. End systolic cavity size is normal.   Prior study available for comparison from 08/17/2018.   Fixed perfusion defect at apex and apical inferior wall with normal wall motion, consistent with artifact Low risk study   ECHO 03/22/23 IMPRESSIONS   1. Abnormal septal motion Strain and 3D EF not performed. Left  ventricular ejection fraction, by estimation, is 50 to 55%. The left  ventricle has low normal function. The left ventricle has no regional wall  motion abnormalities. Left ventricular  diastolic parameters were normal.   2. Right ventricular systolic function is normal. The right ventricular  size is normal.   3. The mitral valve is abnormal. Trivial mitral valve regurgitation. No  evidence of mitral stenosis.   4. The aortic valve is tricuspid. There is  moderate calcification of the  aortic valve. There is moderate thickening of the aortic valve. Aortic  valve regurgitation is not visualized. Aortic valve  sclerosis/calcification is present,  without any evidence  of aortic stenosis.   5. The inferior vena cava is normal in size with greater than 50%  respiratory variability, suggesting right atrial pressure of 3 mmHg.     Patient Profile     78 y.o. female with a hx of  Coronary artery disease  s/p prior inf MI in 1998 tx with stent to the RCA S/p stent LCx in 2012 S/p stent to the LAD in 2015 S/p DES x 3 to the RCA 2017 Myoview 07/2018: low risk Cath 03/2019: dRCA diff dz'd supplying small/dz'd PDA+PL br (not ideal for PCI) >> med Rx (HFpEF) heart failure with preserved ejection fraction  Diabetes mellitus  Hypertension Hyperlipidemia AAA S/p EVAR 01/2016 who is being seen 03/22/2023 for the evaluation of chest pain at the request of Dr. Lanae Boast.   Assessment & Plan    Chest pain  CAD s/p PCI's  HLD - Presented to ED on 03/21/23 for intermittent CP x1 week. Similar to previous angina episodes. Relieved after 0.4 mg NTG x3 and 324 ASA. Pain is relieved with changes in position, both laying and sitting. Also, reported falling on right chest side approx 2 weeks ago and reports constant 10/10 pain. She does not relate right side chest pain to central chest pain. Denies any SOB, dizziness, diaphoresis.  - Initial Labs: K 3.3, BUN <5, Cr 0.64; HsTn 7>5 - initial EKG: NSR, HR 62 with RBBB, no specific ST changes  - CXR 03/21/23: Superolateral right lung opacity, better evaluated on recent chest CT. No new focal consolidations. - LDL 10/05/22: 107.  - She reported never starting Lipitor or Imdur. The only home cardiac medications includes NTG, ASA, and Atenolol, which she reports complaints. - ECHO 03/22/23: LVEF 50-55, low normal LV function, no RWMA. Trivial MVR. Moderate Aortic calcification w/o AS. (Compared 01/2021: EF 55%.) - Pain meds given includes norco/vicodin and oxycodone were d/c. Pain did improve for chest pain but is still present.  - Currently on Atenolol 25, Amlodipine 2.5 mg, Lipitor 40 mg, NTG paste - d/c  Amlodipine 2.5 mg. May consider NTG paste d/c if BP remain soft. In the setting of ischemic history, will plan to add imdur when BP allows.  - Suspect CP is more related to skeletal muscle  from given reproducible CP on the right side and pain relief with pressure to center. Also could be 2/2 to right lung mass vs. Pneumonia. She was unable to lung biopsy due ASA use. Planned for 3/10 - Discussed with MD and canceling cath for tomorrow with low threshold for ischemic injury.    Hypokalemia - 3.3>3.0 treated w/ KCL .  This am, 3.6 - supplemented with Mg yesterday.  - Awaiting Mg lab result   Chronic HFpEF  - on prn lasix  - ECHO 03/22/23: LVEF 50-55, low normal LV function, no RWMA. Trivial MVR. Moderate Aortic calcification w/o AS. (Compared 01/2021: EF 55%.)   HTN  - currently on atenolol 25 mg, Amlodipine 2.5 mg - BP are on the soft side, 97/85, d/c amlodipine 2.5  - will continue to monitor BP and adjust medications as needed.  - If BP remains on soft side, can consider d/c NTG paste next   History of Abdominal Aortic Aneurysm S/p EVAR 01/2016 - denies any abdominal pain    Otherwise managed by  Primary: - Right lung mass vs pneumonia  - Hypothyroidism  - DM      For questions or updates, please contact [facility] Please consult [email or web address] for contact info under        Signed, Basilio Cairo, PA-C  03/23/2023, 11:18 AM      Patient seen and examined. Agree with assessment and plan.  Patient feels somewhat better today.  There is still some residual chest wall discomfort.  She is not having any anginal type symptoms.  PE this morning was soft at 97/85 to low 100s.  Patient had received a dose of amlodipine 2.5 mg.  Need to monitor blood pressure if blood pressure remains low amlodipine be discontinued or nitroglycerin paste.  EKG stable with sinus rhythm at 65, and right bundle branch block.  At present recommend medical management   Lennette Bihari, MD,  [facility] 03/23/2023 1:27 PM

## 2023-03-23 NOTE — Progress Notes (Signed)
 NAME:  [name], MRN:  [id number], DOB:  1945-11-15, LOS: 2 ADMISSION DATE:  03/21/2023, CONSULTATION DATE:  3/5 REFERRING MD:  Andi Hence, CHIEF COMPLAINT:  hypoxia   History of Present Illness:  [name] is a 78 y/o woman with a history of CAD, former tobacco abuse (30 years, quit 1988) who presented with sharp, aching, intermittent right sided chest pain for the past month. It began after a fall several weeks ago, but has persistent. Notes in the chart describe central chest pain that is stabbing, that seems to be different than what she described to me. She was recently seen by cardiology for chest pain with plans to start Imdur to assess for response, and potentially workup for ischemic CAD if no response. No f/c/s, no SOB. Cough is at baseline with some blood-streaked sputum. No n/v/abdominal pain. She has had some diarrhea but not bloody that she is aware of. No edema, no dizziness. Some paresthesias in R hand. No personal or family history of cancers. No previous lung disease. She quit smoking in the 1980s after 30 years of <1ppd.  In the ED she had a CT performed demonstrating a R lung opacity. PCCM consulted for evaluation. She was empirically started on ceftriaxone and azithromycin.  Pertinent  Medical History  CAD IDA Hypothyroidism DM HTN HLD AAA  Significant Hospital Events: Including procedures, antibiotic start and stop dates in addition to other pertinent events   3/4 presented to ED  Interim History / Subjective:  Feels about the same today. Has to wait for aspirin washout for biopsy.  Objective   Blood pressure (!) 109/56, pulse (!) 58, temperature 98 F (36.7 C), resp. rate 18, height 5\' 6"  (1.676 m), weight 79.8 kg, SpO2 94%.        Intake/Output Summary (Last 24 hours) at 03/23/2023 1810 Last data filed at 03/23/2023 0865 Gross per 24 hour  Intake 100 ml  Output --  Net 100 ml   Filed Weights   03/21/23 1733  Weight: 79.8 kg     Examination: General: elderly woman lying in bed in NAD HENT: [place]/AT, eyes anicteric Lungs: breathing comfortably on [place], CTAB, occasional nonproductive cough Cardiovascular: S1S2, RRR Abdomen: soft, NT Extremities: no cyanosis or edema, + clubbing Neuro: awake, alert, moving all extremities Derm: warm, dry, no diffuse rashes  BUN 5 Cr 0.84 WBC 9.6  Resolved Hospital Problem list     Assessment & Plan:  Right sided CP due to peripheral opacity on lung CT-- cause of opacity is not clear, but it's persistence since CXR 02/21/23 is concerning. Differential is lung mass (tobacco abuse history is concerning) vs infection (but doesn't have classic infectious symptoms), vs pulmonary infarct (no PE seen on CT to support this). -agree with empiric antibiotics- azithromycin and ceftriaxone. Complete 7 day course.  -have previously reviewed case with oen of my interventional partners and IR-- potentially hard to get to with transbronchial biopsies, seems like higher risk of pneumothorax and potentially lower diagnostic yield. He felt IR biopsy was more appropriate. Has to wait for aspirin washout. I again offered today that she could try antibiotics and follow up as OP to determine if it was still present, in which case concern for malignancy would be even higher and we would have to biopsy. She does not want to do this-- she wants to stay inpatient until this is all worked up.  Paresthesias in R hand; not acute -not classic for pancoast tumor, but worth thinking about  Best Practice (right click and "Reselect all SmartList Selections" daily)  Per primary  Labs   CBC: Recent Labs  Lab 03/21/23 1837 03/22/23 0642 03/23/23 0743  WBC 8.7 9.0 9.6  NEUTROABS  --  5.8  --   HGB 14.2 12.1 12.2  HCT 46.1* 38.5 38.4  MCV 88.3 87.9 85.7  PLT 306 284 290    Basic Metabolic Panel: Recent Labs  Lab 03/21/23 1837 03/22/23 0642 03/23/23 0743  NA 141 139 137  K 3.3* 3.0* 3.6  CL 102 105  103  CO2 25 28 26   GLUCOSE 78 124* 87  BUN <5* <5* 5*  CREATININE 0.64 0.79 0.84  CALCIUM 9.0 8.1* 8.0*  MG  --  1.7 2.0   GFR: Estimated Creatinine Clearance: 59.8 mL/min (by C-G formula based on SCr of 0.84 mg/dL). Recent Labs  Lab 03/21/23 1837 03/21/23 2113 03/22/23 0056 03/22/23 0642 03/23/23 0743  WBC 8.7  --   --  9.0 9.6  LATICACIDVEN  --  1.0 0.8  --   --     Liver Function Tests: Recent Labs  Lab 03/22/23 0642  AST 15  ALT 9  ALKPHOS 91  BILITOT 0.6  PROT 6.3*  ALBUMIN 2.4*   ABG    Component Value Date/Time   PHART 7.420 01/15/2016 1122   PCO2ART 37.0 01/15/2016 1122   PO2ART 91.9 01/15/2016 1122   HCO3 23.6 01/15/2016 1122   TCO2 26 09/25/2010 1431   ACIDBASEDEF 0.3 01/15/2016 1122   O2SAT 97.3 01/15/2016 1122      Critical care time:      Steffanie Dunn, DO 03/23/23 6:10 PM [facility]  For contact information, see Amion. If no response to pager, please call PCCM consult pager. After hours, 7PM- 7AM, please call Elink.

## 2023-03-23 NOTE — Plan of Care (Signed)

## 2023-03-24 ENCOUNTER — Encounter (HOSPITAL_COMMUNITY)
Admission: EM | Disposition: A | Discharge status: Home / Self Care | Payer: Self-pay | Source: Home / Self Care | Attending: Internal Medicine

## 2023-03-24 ENCOUNTER — Other Ambulatory Visit: Payer: Self-pay | Admitting: Student

## 2023-03-24 DIAGNOSIS — I25119 Atherosclerotic heart disease of native coronary artery with unspecified angina pectoris: Secondary | ICD-10-CM | POA: Diagnosis not present

## 2023-03-24 DIAGNOSIS — Z01818 Encounter for other preprocedural examination: Secondary | ICD-10-CM

## 2023-03-24 DIAGNOSIS — R918 Other nonspecific abnormal finding of lung field: Secondary | ICD-10-CM

## 2023-03-24 DIAGNOSIS — R9389 Abnormal findings on diagnostic imaging of other specified body structures: Secondary | ICD-10-CM | POA: Diagnosis not present

## 2023-03-24 DIAGNOSIS — R079 Chest pain, unspecified: Secondary | ICD-10-CM | POA: Diagnosis not present

## 2023-03-24 LAB — CBC
HCT: 39.2 % (ref 36.0–46.0)
Hemoglobin: 12.4 g/dL (ref 12.0–15.0)
MCH: 27.4 pg (ref 26.0–34.0)
MCHC: 31.6 g/dL (ref 30.0–36.0)
MCV: 86.5 fL (ref 80.0–100.0)
Platelets: 317 10*3/uL (ref 150–400)
RBC: 4.53 MIL/uL (ref 3.87–5.11)
RDW: 14.9 % (ref 11.5–15.5)
WBC: 8 10*3/uL (ref 4.0–10.5)
nRBC: 0 % (ref 0.0–0.2)

## 2023-03-24 LAB — BASIC METABOLIC PANEL
Anion gap: 7 (ref 5–15)
BUN: 6 mg/dL — ABNORMAL LOW (ref 8–23)
CO2: 27 mmol/L (ref 22–32)
Calcium: 7.7 mg/dL — ABNORMAL LOW (ref 8.9–10.3)
Chloride: 103 mmol/L (ref 98–111)
Creatinine, Ser: 0.6 mg/dL (ref 0.44–1.00)
GFR, Estimated: >60 mL/min (ref >60)
Glucose, Bld: 81 mg/dL (ref 70–99)
Potassium: 3.8 mmol/L (ref 3.5–5.1)
Sodium: 137 mmol/L (ref 135–145)

## 2023-03-24 SURGERY — LEFT HEART CATH AND CORONARY ANGIOGRAPHY
Anesthesia: LOCAL

## 2023-03-24 MED ORDER — CEFUROXIME AXETIL 250 MG PO TABS
500.0000 mg | ORAL_TABLET | Freq: Two times a day (BID) | ORAL | Status: AC
  Administered 2023-03-24 – 2023-03-27 (×7): 500 mg via ORAL
  Filled 2023-03-24 (×7): qty 2

## 2023-03-24 NOTE — Progress Notes (Addendum)
 Rounding Note    Patient Name: [name] Date of Encounter: 03/24/2023  [facility] Cardiologist: Tonny Bollman, MD  Subjective   Upon arrival to room, patient is talking with IR about lung biopsy on Monday. On my interview, pt states she is feeling better but thinks her right side chest tenderness is getting worse. It is worst with palpitations of this area.   Inpatient Medications    Scheduled Meds:  atenolol  25 mg Oral Daily   atorvastatin  40 mg Oral Daily   azithromycin  500 mg Oral QHS   levothyroxine  100 mcg Oral QAC breakfast   Continuous Infusions:  cefTRIAXone (ROCEPHIN)  IV 2 g (03/23/23 2131)   PRN Meds: HYDROcodone-acetaminophen, nitroGLYCERIN   Vital Signs    Vitals:   03/24/23 0527 03/24/23 0741 03/24/23 1158 03/24/23 1228  BP: 131/62 102/61 115/69 107/63  Pulse: 63 65 (!) 54 (!) 52  Resp:  18 18   Temp:  98 F (36.7 C) 98.1 F (36.7 C) 98.2 F (36.8 C)  TempSrc:   Oral Oral  SpO2: 100% 92% 98% 100%  Weight:      Height:       No intake or output data in the 24 hours ending 03/24/23 1316    03/21/2023    5:33 PM 03/02/2023    9:53 AM 02/21/2023    1:23 PM  Last 3 Weights  Weight (lbs) 176 lb 176 lb 3.2 oz 175 lb  Weight (kg) 79.833 kg 79.924 kg 79.379 kg      Telemetry    NSR 70-80's with baseline artifact - Personally Reviewed  ECG    None new - Personally Reviewed  Physical Exam   GEN: Laying in bed with No acute distress.   Neck: No JVD Cardiac: RRR, no murmurs, rubs, or gallop; +2 radial pulse; reproducible chest discomfort with palpitations to right chest upper wall.  Respiratory: Clear to auscultation bilaterally. GI: Soft, nontender, non-distended  MS: No edema; No deformity. Neuro:  Nonfocal  Psych: Normal affect   Labs    High Sensitivity Troponin:   Recent Labs  Lab 03/21/23 1837 03/21/23 2113  TROPONINIHS 7 5     Chemistry Recent Labs  Lab 03/22/23 0642 03/23/23 0743 03/24/23 0747  NA 139  137 137  K 3.0* 3.6 3.8  CL 105 103 103  CO2 28 26 27   GLUCOSE 124* 87 81  BUN <5* 5* 6*  CREATININE 0.79 0.84 0.60  CALCIUM 8.1* 8.0* 7.7*  MG 1.7 2.0  --   PROT 6.3*  --   --   ALBUMIN 2.4*  --   --   AST 15  --   --   ALT 9  --   --   ALKPHOS 91  --   --   BILITOT 0.6  --   --   GFRNONAA >60 >60 >60  ANIONGAP 6 8 7     Lipids  Recent Labs  Lab 03/22/23 0642  CHOL 119  TRIG 62  HDL 22*  LDLCALC 85  CHOLHDL 5.4    Hematology Recent Labs  Lab 03/22/23 0642 03/23/23 0743 03/24/23 0747  WBC 9.0 9.6 8.0  RBC 4.38 4.48 4.53  HGB 12.1 12.2 12.4  HCT 38.5 38.4 39.2  MCV 87.9 85.7 86.5  MCH 27.6 27.2 27.4  MCHC 31.4 31.8 31.6  RDW 15.1 14.9 14.9  PLT 284 290 317   Thyroid  Recent Labs  Lab 03/22/23 0641 03/22/23 1610  TSH  --  0.067*  FREET4 1.16*  --     BNPNo results for input(s): "BNP", "PROBNP" in the last 168 hours.  DDimer No results for input(s): "DDIMER" in the last 168 hours.   Radiology    ECHOCARDIOGRAM COMPLETE Result Date: 03/22/2023    ECHOCARDIOGRAM REPORT   Patient Name:   [name] Date of Exam: 03/22/2023 Medical Rec #:  [id number]      Height:       66.0 in Accession #:    [id number]     Weight:       176.0 lb Date of Birth:  Oct 06, 1945       BSA:          1.894 m Patient Age:    77 years       BP:           134/64 mmHg Patient Gender: F              HR:           68 bpm. Exam Location:  Inpatient Procedure: 2D Echo, Cardiac Doppler and Color Doppler (Both Spectral and Color            Flow Doppler were utilized during procedure). Indications:    Chest Pain  History:        Patient has prior history of Echocardiogram examinations, most                 recent 02/03/2021. CAD; AAA.  Sonographer:    Amy Chionchio Referring Phys: Midge Minium, N IMPRESSIONS  1. Abnormal septal motion Strain and 3D EF not performed. Left ventricular ejection fraction, by estimation, is 50 to 55%. The left ventricle has low normal function. The left ventricle has no  regional wall motion abnormalities. Left ventricular diastolic parameters were normal.  2. Right ventricular systolic function is normal. The right ventricular size is normal.  3. The mitral valve is abnormal. Trivial mitral valve regurgitation. No evidence of mitral stenosis.  4. The aortic valve is tricuspid. There is moderate calcification of the aortic valve. There is moderate thickening of the aortic valve. Aortic valve regurgitation is not visualized. Aortic valve sclerosis/calcification is present, without any evidence of aortic stenosis.  5. The inferior vena cava is normal in size with greater than 50% respiratory variability, suggesting right atrial pressure of 3 mmHg. FINDINGS  Left Ventricle: Abnormal septal motion Strain and 3D EF not performed. Left ventricular ejection fraction, by estimation, is 50 to 55%. The left ventricle has low normal function. The left ventricle has no regional wall motion abnormalities. Strain was performed and the global longitudinal strain is indeterminate. The left ventricular internal cavity size was normal in size. There is no left ventricular hypertrophy. Left ventricular diastolic parameters were normal. Right Ventricle: The right ventricular size is normal. No increase in right ventricular wall thickness. Right ventricular systolic function is normal. Left Atrium: Left atrial size was normal in size. Right Atrium: Right atrial size was normal in size. Pericardium: There is no evidence of pericardial effusion. Mitral Valve: The mitral valve is abnormal. There is mild thickening of the mitral valve leaflet(s). There is mild calcification of the mitral valve leaflet(s). Mild mitral annular calcification. Trivial mitral valve regurgitation. No evidence of mitral valve stenosis. MV peak gradient, 5.3 mmHg. The mean mitral valve gradient is 2.0 mmHg. Tricuspid Valve: The tricuspid valve is normal in structure. Tricuspid valve regurgitation is mild . No evidence of tricuspid  stenosis. Aortic  Valve: The aortic valve is tricuspid. There is moderate calcification of the aortic valve. There is moderate thickening of the aortic valve. Aortic valve regurgitation is not visualized. Aortic valve sclerosis/calcification is present, without any  evidence of aortic stenosis. Aortic valve mean gradient measures 4.0 mmHg. Aortic valve peak gradient measures 6.7 mmHg. Aortic valve area, by VTI measures 2.31 cm. Pulmonic Valve: The pulmonic valve was normal in structure. Pulmonic valve regurgitation is not visualized. No evidence of pulmonic stenosis. Aorta: The aortic root is normal in size and structure. Venous: The inferior vena cava is normal in size with greater than 50% respiratory variability, suggesting right atrial pressure of 3 mmHg. IAS/Shunts: No atrial level shunt detected by color flow Doppler. Additional Comments: 3D was performed not requiring image post processing on an independent workstation and was indeterminate.  LEFT VENTRICLE PLAX 2D LVIDd:         4.90 cm      Diastology LVIDs:         3.10 cm      LV e' medial:    4.57 cm/s LV PW:         0.80 cm      LV E/e' medial:  17.2 LV IVS:        0.80 cm      LV e' lateral:   9.36 cm/s LVOT diam:     2.00 cm      LV E/e' lateral: 8.4 LV SV:         60 LV SV Index:   32 LVOT Area:     3.14 cm  LV Volumes (MOD) LV vol d, MOD A2C: 105.0 ml LV vol d, MOD A4C: 91.8 ml LV vol s, MOD A4C: 42.4 ml LV SV MOD A4C:     91.8 ml RIGHT VENTRICLE            IVC RV Basal diam:  3.50 cm    IVC diam: 1.40 cm RV S prime:     8.05 cm/s TAPSE (M-mode): 1.5 cm LEFT ATRIUM             Index        RIGHT ATRIUM           Index LA Vol (A2C):   54.8 ml 28.93 ml/m  RA Area:     12.70 cm LA Vol (A4C):   67.0 ml 35.37 ml/m  RA Volume:   30.40 ml  16.05 ml/m LA Biplane Vol: 61.6 ml 32.52 ml/m  AORTIC VALVE                    PULMONIC VALVE AV Area (Vmax):    2.31 cm     PV Vmax:       0.62 m/s AV Area (Vmean):   2.36 cm     PV Peak grad:  1.5 mmHg AV Area  (VTI):     2.31 cm AV Vmax:           129.00 cm/s AV Vmean:          88.000 cm/s AV VTI:            0.260 m AV Peak Grad:      6.7 mmHg AV Mean Grad:      4.0 mmHg LVOT Vmax:         95.00 cm/s LVOT Vmean:        66.100 cm/s LVOT VTI:          0.191 m LVOT/AV  VTI ratio: 0.73  AORTA Ao Root diam: 2.60 cm Ao Asc diam:  2.70 cm MITRAL VALVE MV Area (PHT): 3.10 cm    SHUNTS MV Area VTI:   1.92 cm    Systemic VTI:  0.19 m MV Peak grad:  5.3 mmHg    Systemic Diam: 2.00 cm MV Mean grad:  2.0 mmHg MV Vmax:       1.15 m/s MV Vmean:      69.5 cm/s MV Decel Time: 245 msec MV E velocity: 78.60 cm/s MV A velocity: 89.60 cm/s MV E/A ratio:  0.88 Charlton Haws MD Electronically signed by Charlton Haws MD Signature Date/Time: 03/22/2023/4:35:38 PM    Final     Cardiac Studies   ECHO 02/03/2021 IMPRESSIONS  1. Left ventricular ejection fraction, by estimation, is 55%. The left  ventricle has normal function. The left ventricle demonstrates regional  wall motion abnormalities with basal to mid inferior hypokinesis. Left  ventricular diastolic parameters are  consistent with Grade I diastolic dysfunction (impaired relaxation).   2. Right ventricular systolic function is normal. The right ventricular  size is normal. Tricuspid regurgitation signal is inadequate for assessing  PA pressure.   3. The mitral valve is normal in structure. No evidence of mitral valve  regurgitation. No evidence of mitral stenosis.   4. The aortic valve is tricuspid. Aortic valve regurgitation is not  visualized. Aortic valve sclerosis/calcification is present, without any  evidence of aortic stenosis.   5. The inferior vena cava is normal in size with greater than 50%  respiratory variability, suggesting right atrial pressure of 3 mmHg.   Comparison(s): 02/09/18 EF 50-55%.    Lexiscan 06/2022   The study is normal. The study is low risk.   No ST deviation was noted.   LV perfusion is normal. There is no evidence of ischemia. There is  no evidence of infarction.   Left ventricular function is normal. Nuclear stress EF: 62 %. The left ventricular ejection fraction is normal (55-65%). End diastolic cavity size is normal. End systolic cavity size is normal.   Prior study available for comparison from 08/17/2018.   Fixed perfusion defect at apex and apical inferior wall with normal wall motion, consistent with artifact Low risk study     ECHO 03/22/23 IMPRESSIONS   1. Abnormal septal motion Strain and 3D EF not performed. Left  ventricular ejection fraction, by estimation, is 50 to 55%. The left  ventricle has low normal function. The left ventricle has no regional wall  motion abnormalities. Left ventricular  diastolic parameters were normal.   2. Right ventricular systolic function is normal. The right ventricular  size is normal.   3. The mitral valve is abnormal. Trivial mitral valve regurgitation. No  evidence of mitral stenosis.   4. The aortic valve is tricuspid. There is moderate calcification of the  aortic valve. There is moderate thickening of the aortic valve. Aortic  valve regurgitation is not visualized. Aortic valve  sclerosis/calcification is present, without any evidence  of aortic stenosis.   5. The inferior vena cava is normal in size with greater than 50%  respiratory variability, suggesting right atrial pressure of 3 mmHg.   Patient Profile     78 y.o. female with a hx of  Coronary artery disease  s/p prior inf MI in 1998 tx with stent to the RCA S/p stent LCx in 2012 S/p stent to the LAD in 2015 S/p DES x 3 to the RCA 2017 Myoview 07/2018: low risk  Cath 03/2019: dRCA diff dz'd supplying small/dz'd PDA+PL br (not ideal for PCI) >> med Rx (HFpEF) heart failure with preserved ejection fraction  Diabetes mellitus  Hypertension Hyperlipidemia AAA S/p EVAR 01/2016 who is being seen 03/22/2023 for the evaluation of chest pain at the request of Dr. Lanae Boast.   Assessment & Plan    Chest pain  CAD  s/p PCI's  HLD - Presented to ED on 03/21/23 for intermittent CP x1 week. Similar to previous angina episodes. Relieved after 0.4 mg NTG x3 and 324 ASA. Pain is relieved with changes in position, both laying and sitting. Also, reported falling on right chest side approx 2 weeks ago and reports constant 10/10 pain. She does not relate right side chest pain to central chest pain. Denies any SOB, dizziness, diaphoresis.  - Initial Labs: K 3.3, BUN <5, Cr 0.64; HsTn 7>5 - initial EKG: NSR, HR 62 with RBBB, no specific ST changes  - CXR 03/21/23: Superolateral right lung opacity, better evaluated on recent chest CT. No new focal consolidations. - LDL 10/05/22: 107.  - She reported never starting Lipitor or Imdur. The only home cardiac medications includes NTG, ASA, and Atenolol, which she reports complaints. - ECHO 03/22/23: LVEF 50-55, low normal LV function, no RWMA. Trivial MVR. Moderate Aortic calcification w/o AS. (Compared 01/2021: EF 55%.) - Pain meds given includes norco/vicodin and oxycodone were d/c. Pain did improve for chest pain but is still present.  - Currently on Atenolol 25, Amlodipine 2.5 mg, Lipitor 40 mg - d/c Amlodipine 2.5 mg, NTG paste - Suspect CP is more related to skeletal muscle from given reproducible CP on the right side and pain relief with pressure to center. Also could be 2/2 to right lung mass vs. Pneumonia. She was unable to lung biopsy due ASA use. Planned for 3/10 - Per IR, avoid any blood thinners at this time until biopsy is completed.  - Discussed with MD and canceling cath for this admission with low threshold for ischemic injury.  - currently BP 107/63, HR 52 - this am, pt states she is feeling better but thinks her right side chest tenderness is getting worse. It is worst with palpitations of this area.  - Exam today: reproducible chest discomfort with palpitations to right chest upper wall, otherwise unremarkable  - In the setting of ischemic history, will plan to add  imdur when BP allows.    Hypokalemia - 3.3>3.0 >3.6 treated w/ KCL .  This am, 3.8 -  Mg 2.0   Chronic HFpEF  - on prn lasix  - ECHO 03/22/23: LVEF 50-55, low normal LV function, no RWMA. Trivial MVR. Moderate Aortic calcification w/o AS. (Compared 01/2021: EF 55%.)   HTN  - currently on atenolol 25 mg, - d/c amlodipine 2.5 with BP on soft side, can add if future if needed.  - will continue to monitor BP and adjust medications as needed.  - If BP remains on soft side, can consider d/c NTG paste next   History of Abdominal Aortic Aneurysm S/p EVAR 01/2016 - denies any abdominal pain    Otherwise managed by Primary: - Right lung mass vs pneumonia  - Hypothyroidism  - DM      For questions or updates, please contact [facility] Please consult [email or web address] for contact info under        Signed, Basilio Cairo, PA-C  03/24/2023, 1:16 PM      Patient seen and examined. Agree with assessment and plan.  Scheduled  for CT-guided lung nodule/mass biopsy on March 27, 2023.  He continues to complain of superficial chest wall tenderness predominantly on the right side extending to the sternal region.  I do not believe this is cardiac in etiology and is musculoskeletal.  Aspirin on hold for planned lung biopsy.  At present no further cardiac workup planned.   Lennette Bihari, MD, [facility] 03/24/2023 2:29 PM

## 2023-03-24 NOTE — Progress Notes (Signed)
 PCCM interval note  I reviewed the patient's CT scan of the chest.  Agree that the right sided pleural-based opacity would be amenable to TTNA in interventional radiology.  Navigational bronchoscopy also a reasonable approach although it looks like we will be able to get TTNA performed sooner.  PCCM available if needed.  Please call  Levy Pupa, MD, PhD 03/24/2023, 1:44 PM [facility] [phone] or if no answer before 7:00PM call [phone] For any issues after 7:00PM please call eLink [phone]

## 2023-03-24 NOTE — Progress Notes (Signed)
 SCD placed

## 2023-03-24 NOTE — Care Management Important Message (Signed)
 Important Message  Patient Details  Name: [name] MRN: [id number] Date of Birth: 1945/06/06   Important Message Given:  Yes - Medicare IM     Dorena Bodo 03/24/2023, 2:40 PM

## 2023-03-24 NOTE — Plan of Care (Signed)
  Problem: Education: Goal: Knowledge of General Education information will improve Description: Including pain rating scale, medication(s)/side effects and non-pharmacologic comfort measures Outcome: Progressing   Problem: Clinical Measurements: Goal: Respiratory complications will improve Outcome: Progressing Goal: Cardiovascular complication will be avoided Outcome: Progressing   Problem: Activity: Goal: Risk for activity intolerance will decrease Outcome: Progressing   Problem: Nutrition: Goal: Adequate nutrition will be maintained Outcome: Progressing   Problem: Coping: Goal: Level of anxiety will decrease Outcome: Progressing   Problem: Elimination: Goal: Will not experience complications related to bowel motility Outcome: Progressing Goal: Will not experience complications related to urinary retention Outcome: Progressing   Problem: Safety: Goal: Ability to remain free from injury will improve Outcome: Progressing   Problem: Skin Integrity: Goal: Risk for impaired skin integrity will decrease Outcome: Progressing   Problem: Activity: Goal: Ability to tolerate increased activity will improve Outcome: Progressing   Problem: Clinical Measurements: Goal: Ability to maintain a body temperature in the normal range will improve Outcome: Progressing

## 2023-03-24 NOTE — Plan of Care (Signed)
   Problem: Activity: Goal: Risk for activity intolerance will decrease Outcome: Progressing   Problem: Nutrition: Goal: Adequate nutrition will be maintained Outcome: Progressing   Problem: Coping: Goal: Level of anxiety will decrease Outcome: Progressing   Problem: Elimination: Goal: Will not experience complications related to bowel motility Outcome: Progressing

## 2023-03-24 NOTE — Progress Notes (Signed)
 PROGRESS NOTE [name]  XBJ:[id number] DOB: 06-16-1945 DOA: 03/21/2023 PCP: Corwin Levins, MD  Brief Narrative/Hospital Course: 78 yof w/ hx of CAD s/p multiple PCI's, diastolic CHF, diabetes mellitus presently not on meds, hypothyroidism presented to the ED with complaint of chest pain on and off x 3 wks, and found to have  RUL lung lesion in outpatient CT chest and concerning for necrotizing neoplastic lesion versus pneumonia.Patient states she also had a fall and hit her right side of the chest following which she had more chest pain.  In the ED vitals stable with labs unremarkable troponin negative x 2 lactic acid normal and patient was admitted for further management of chest pain and lung mass workup.  Procedure: CT Chest 3/3> 8.2 x 2.6 x 7.1 cm irregular, pleural-based mass laterally in the right upper chest with a minimal air or gas-filled cavitary component and predominantly low-density centrally, possibly necrotic or filled with fluid-necrotic malignancy or infectious process with empyema.  Stable paratracheal/precarinal lymph nodes calcific aortic and coronary atherosclerosis.  Consultation: Cardiology PCCM IR  Subjective: Seen and examined this morning Overnight afebrile BP stable, on room air Labs reviewed stable CBC Reports pain medicine helped yesterday, had some ongoing right-sided chest pain  Assessment and Plan: Principal Problem:   Lung mass Active Problems:   Hypothyroidism   Diabetes (HCC)   Mixed hyperlipidemia   Essential hypertension   Abdominal aortic aneurysm (AAA) without rupture   Coronary artery disease   Chronic heart failure with preserved ejection fraction (HFpEF) (HCC)   Right-sided chest pain   Hyperlipidemia with target low density lipoprotein (LDL) cholesterol less than 55 mg/dL   Hypokalemia   Abnormal chest CT   Chest pain x 3 weeks RUL Lung mass Possible pneumonia: Significant mass noticed on outpatient CT chest: Differential includes  lung mass/malignancy/infarct/pneumonia-but no classical infectious symptoms or signs.  Keeping on empiric antibiotics.  Pulmonary consulted input appreciated planning for CT-guided biopsy, awaiting aspirin washout-biopsy arranged for 3/10 patient is persistent on getting biopsy as inpatient, lives alone Per PCCM difficult to bronc due to peripheral lesion.   Diastolic CHF: Compensated on admission. PTA on prn lasix.   CAD s/p stents Chest pain: Significant cardiac history and having chest pain-likely musculoskeletal, pain is mostly right-sided, controlled with pain management. Cardiology input appreciated no plan for cardiac cath. Holding aspirin for lung mass biopsy. Continue atenolol, Lipitor. On NTG  ointment> BP soft, will plan to discontinue. TTE 3/5-showed EF 50-55% no RWMA RV SF normal RV size normal.   Hypothyroidism:  TSH low at 0.06 and FT4 elevated 1.16 likely iatrogenic Synthroid dose decreased-rechekc tsh/ft4 in 3 wk  AAA hx:  Stable.  No abdominal pain currently.   Hypokalemia: Resolved.    DVT prophylaxis: SCDs Start: 03/22/23 0504 Code Status:   Code Status: Full Code Family Communication: plan of care discussed with patient/none at bedside. Patient status is: Remains hospitalized because of severity of illness Level of care: Telemetry Medical   Dispo: The patient is from: Home lives alone            Anticipated disposition: TBD Objective: Vitals last 24 hrs: Vitals:   03/23/23 2014 03/24/23 0525 03/24/23 0527 03/24/23 0741  BP: 105/61  131/62 102/61  Pulse: 73 69 63 65  Resp: 16 16  18   Temp: 98.9 F (37.2 C) 97.9 F (36.6 C)  98 F (36.7 C)  TempSrc: Oral Oral    SpO2: 91% 98% 100% 92%  Weight:  Height:       Weight change:   Physical Examination: General exam: alert awake, oriented HEENT:Oral mucosa moist, Ear/Nose WNL grossly Respiratory system: Bilaterally clear BS,no use of accessory muscle Cardiovascular system: S1 & S2 +, No  JVD. Gastrointestinal system: Abdomen soft,NT,ND, BS+ Nervous System: Alert, awake, moving all extremities,and following commands. Extremities: LE edema neg,distal peripheral pulses palpable and warm.  Skin: No rashes,no icterus. MSK: Normal muscle bulk,tone, power   Medications reviewed:  Scheduled Meds:  atenolol  25 mg Oral Daily   atorvastatin  40 mg Oral Daily   azithromycin  500 mg Oral QHS   levothyroxine  100 mcg Oral QAC breakfast   Continuous Infusions:  cefTRIAXone (ROCEPHIN)  IV 2 g (03/23/23 2131)     Diet Order             Diet regular Room service appropriate? Yes; Fluid consistency: Thin  Diet effective now                 No intake or output data in the 24 hours ending 03/24/23 0841  Net IO Since Admission: 443.52 mL [03/24/23 0841]  Wt Readings from Last 3 Encounters:  03/21/23 79.8 kg  03/02/23 79.9 kg  02/21/23 79.4 kg     Unresulted Labs (From admission, onward)     Start     Ordered   03/23/23 0500  Basic metabolic panel  Daily,   R      03/22/23 1117   03/23/23 0500  CBC  Daily,   R      03/22/23 1117   03/22/23 0507  Expectorated Sputum Assessment w Gram Stain, Rflx to Resp Cult  Once,   R        03/22/23 0507          Data Reviewed: I have personally reviewed following labs and imaging studies CBC: Recent Labs  Lab 03/21/23 1837 03/22/23 0642 03/23/23 0743  WBC 8.7 9.0 9.6  NEUTROABS  --  5.8  --   HGB 14.2 12.1 12.2  HCT 46.1* 38.5 38.4  MCV 88.3 87.9 85.7  PLT 306 284 290   Basic Metabolic Panel:  Recent Labs  Lab 03/21/23 1837 03/22/23 0642 03/23/23 0743  NA 141 139 137  K 3.3* 3.0* 3.6  CL 102 105 103  CO2 25 28 26   GLUCOSE 78 124* 87  BUN <5* <5* 5*  CREATININE 0.64 0.79 0.84  CALCIUM 9.0 8.1* 8.0*  MG  --  1.7 2.0   GFR: Estimated Creatinine Clearance: 59.8 mL/min (by C-G formula based on SCr of 0.84 mg/dL). Liver Function Tests:  Recent Labs  Lab 03/22/23 0642  AST 15  ALT 9  ALKPHOS 91  BILITOT  0.6  PROT 6.3*  ALBUMIN 2.4*   Recent Labs    03/22/23 0642  CHOL 119  HDL 22*  LDLCALC 85  TRIG 62  CHOLHDL 5.4   Recent Labs    03/22/23 0641 03/22/23 0642  TSH  --  0.067*  FREET4 1.16*  --    Sepsis Labs: Recent Labs  Lab 03/21/23 2113 03/22/23 0056  LATICACIDVEN 1.0 0.8   Recent Results (from the past 240 hours)  Blood culture (routine x 2)     Status: None (Preliminary result)   Collection Time: 03/21/23  9:00 PM   Specimen: BLOOD RIGHT WRIST  Result Value Ref Range Status   Specimen Description BLOOD RIGHT WRIST  Final   Special Requests   Final    BOTTLES  DRAWN AEROBIC ONLY Blood Culture results may not be optimal due to an inadequate volume of blood received in culture bottles   Culture   Final    NO GROWTH 2 DAYS Performed at [facility], [address], [place], [place] [address]    Report Status PENDING  Incomplete  Blood culture (routine x 2)     Status: None (Preliminary result)   Collection Time: 03/21/23  9:13 PM   Specimen: BLOOD  Result Value Ref Range Status   Specimen Description BLOOD RIGHT ANTECUBITAL  Final   Special Requests   Final    BOTTLES DRAWN AEROBIC AND ANAEROBIC Blood Culture results may not be optimal due to an inadequate volume of blood received in culture bottles   Culture   Final    NO GROWTH 2 DAYS Performed at [facility], [address], [place], [place] [address]    Report Status PENDING  Incomplete    Antimicrobials/Microbiology: Anti-infectives (From admission, onward)    Start     Dose/Rate Route Frequency Ordered Stop   03/22/23 2200  cefTRIAXone (ROCEPHIN) 2 g in sodium chloride 0.9 % 100 mL IVPB        2 g 200 mL/hr over 30 Minutes Intravenous Every 24 hours 03/22/23 0507 03/27/23 2159   03/22/23 2200  azithromycin (ZITHROMAX) 500 mg in sodium chloride 0.9 % 250 mL IVPB  Status:  Discontinued        500 mg 250 mL/hr over 60 Minutes Intravenous Every 24 hours 03/22/23 0507 03/22/23 1611    03/22/23 2200  azithromycin (ZITHROMAX) tablet 500 mg        500 mg Oral Daily at bedtime 03/22/23 1611 03/26/23 2159   03/21/23 1930  cefTRIAXone (ROCEPHIN) 2 g in sodium chloride 0.9 % 100 mL IVPB        2 g 200 mL/hr over 30 Minutes Intravenous  Once 03/21/23 1916 03/21/23 2150   03/21/23 1930  azithromycin (ZITHROMAX) 500 mg in sodium chloride 0.9 % 250 mL IVPB        500 mg 250 mL/hr over 60 Minutes Intravenous  Once 03/21/23 1916 03/21/23 2339         Component Value Date/Time   SDES BLOOD RIGHT ANTECUBITAL 03/21/2023 2113   SPECREQUEST  03/21/2023 2113    BOTTLES DRAWN AEROBIC AND ANAEROBIC Blood Culture results may not be optimal due to an inadequate volume of blood received in culture bottles   CULT  03/21/2023 2113    NO GROWTH 2 DAYS Performed at [facility], [address], [place], [place] [address]    REPTSTATUS PENDING 03/21/2023 2113     Radiology Studies: ECHOCARDIOGRAM COMPLETE Result Date: 03/22/2023    ECHOCARDIOGRAM REPORT   Patient Name:   [name] Date of Exam: 03/22/2023 Medical Rec #:  [id number]      Height:       66.0 in Accession #:    [id number]     Weight:       176.0 lb Date of Birth:  28-Jun-1945       BSA:          1.894 m Patient Age:    77 years       BP:           134/64 mmHg Patient Gender: F              HR:           68 bpm. Exam  Location:  Inpatient Procedure: 2D Echo, Cardiac Doppler and Color Doppler (Both Spectral and Color            Flow Doppler were utilized during procedure). Indications:    Chest Pain  History:        Patient has prior history of Echocardiogram examinations, most                 recent 02/03/2021. CAD; AAA.  Sonographer:    Amy Chionchio Referring Phys: Midge Minium, N IMPRESSIONS  1. Abnormal septal motion Strain and 3D EF not performed. Left ventricular ejection fraction, by estimation, is 50 to 55%. The left ventricle has low normal function. The left ventricle has no regional wall motion abnormalities. Left  ventricular diastolic parameters were normal.  2. Right ventricular systolic function is normal. The right ventricular size is normal.  3. The mitral valve is abnormal. Trivial mitral valve regurgitation. No evidence of mitral stenosis.  4. The aortic valve is tricuspid. There is moderate calcification of the aortic valve. There is moderate thickening of the aortic valve. Aortic valve regurgitation is not visualized. Aortic valve sclerosis/calcification is present, without any evidence of aortic stenosis.  5. The inferior vena cava is normal in size with greater than 50% respiratory variability, suggesting right atrial pressure of 3 mmHg. FINDINGS  Left Ventricle: Abnormal septal motion Strain and 3D EF not performed. Left ventricular ejection fraction, by estimation, is 50 to 55%. The left ventricle has low normal function. The left ventricle has no regional wall motion abnormalities. Strain was performed and the global longitudinal strain is indeterminate. The left ventricular internal cavity size was normal in size. There is no left ventricular hypertrophy. Left ventricular diastolic parameters were normal. Right Ventricle: The right ventricular size is normal. No increase in right ventricular wall thickness. Right ventricular systolic function is normal. Left Atrium: Left atrial size was normal in size. Right Atrium: Right atrial size was normal in size. Pericardium: There is no evidence of pericardial effusion. Mitral Valve: The mitral valve is abnormal. There is mild thickening of the mitral valve leaflet(s). There is mild calcification of the mitral valve leaflet(s). Mild mitral annular calcification. Trivial mitral valve regurgitation. No evidence of mitral valve stenosis. MV peak gradient, 5.3 mmHg. The mean mitral valve gradient is 2.0 mmHg. Tricuspid Valve: The tricuspid valve is normal in structure. Tricuspid valve regurgitation is mild . No evidence of tricuspid stenosis. Aortic Valve: The aortic valve  is tricuspid. There is moderate calcification of the aortic valve. There is moderate thickening of the aortic valve. Aortic valve regurgitation is not visualized. Aortic valve sclerosis/calcification is present, without any  evidence of aortic stenosis. Aortic valve mean gradient measures 4.0 mmHg. Aortic valve peak gradient measures 6.7 mmHg. Aortic valve area, by VTI measures 2.31 cm. Pulmonic Valve: The pulmonic valve was normal in structure. Pulmonic valve regurgitation is not visualized. No evidence of pulmonic stenosis. Aorta: The aortic root is normal in size and structure. Venous: The inferior vena cava is normal in size with greater than 50% respiratory variability, suggesting right atrial pressure of 3 mmHg. IAS/Shunts: No atrial level shunt detected by color flow Doppler. Additional Comments: 3D was performed not requiring image post processing on an independent workstation and was indeterminate.  LEFT VENTRICLE PLAX 2D LVIDd:         4.90 cm      Diastology LVIDs:         3.10 cm      LV e' medial:  4.57 cm/s LV PW:         0.80 cm      LV E/e' medial:  17.2 LV IVS:        0.80 cm      LV e' lateral:   9.36 cm/s LVOT diam:     2.00 cm      LV E/e' lateral: 8.4 LV SV:         60 LV SV Index:   32 LVOT Area:     3.14 cm  LV Volumes (MOD) LV vol d, MOD A2C: 105.0 ml LV vol d, MOD A4C: 91.8 ml LV vol s, MOD A4C: 42.4 ml LV SV MOD A4C:     91.8 ml RIGHT VENTRICLE            IVC RV Basal diam:  3.50 cm    IVC diam: 1.40 cm RV S prime:     8.05 cm/s TAPSE (M-mode): 1.5 cm LEFT ATRIUM             Index        RIGHT ATRIUM           Index LA Vol (A2C):   54.8 ml 28.93 ml/m  RA Area:     12.70 cm LA Vol (A4C):   67.0 ml 35.37 ml/m  RA Volume:   30.40 ml  16.05 ml/m LA Biplane Vol: 61.6 ml 32.52 ml/m  AORTIC VALVE                    PULMONIC VALVE AV Area (Vmax):    2.31 cm     PV Vmax:       0.62 m/s AV Area (Vmean):   2.36 cm     PV Peak grad:  1.5 mmHg AV Area (VTI):     2.31 cm AV Vmax:            129.00 cm/s AV Vmean:          88.000 cm/s AV VTI:            0.260 m AV Peak Grad:      6.7 mmHg AV Mean Grad:      4.0 mmHg LVOT Vmax:         95.00 cm/s LVOT Vmean:        66.100 cm/s LVOT VTI:          0.191 m LVOT/AV VTI ratio: 0.73  AORTA Ao Root diam: 2.60 cm Ao Asc diam:  2.70 cm MITRAL VALVE MV Area (PHT): 3.10 cm    SHUNTS MV Area VTI:   1.92 cm    Systemic VTI:  0.19 m MV Peak grad:  5.3 mmHg    Systemic Diam: 2.00 cm MV Mean grad:  2.0 mmHg MV Vmax:       1.15 m/s MV Vmean:      69.5 cm/s MV Decel Time: 245 msec MV E velocity: 78.60 cm/s MV A velocity: 89.60 cm/s MV E/A ratio:  0.88 Charlton Haws MD Electronically signed by Charlton Haws MD Signature Date/Time: 03/22/2023/4:35:38 PM    Final      LOS: 3 days   Total time spent in review of labs and imaging, patient evaluation, formulation of plan, documentation and communication with family: 35 minutes  Lanae Boast, MD  [facility]  03/24/2023, 8:41 AM

## 2023-03-24 NOTE — Consult Note (Signed)
 Chief Complaint: Patient was seen in consultation today for  Chief Complaint  Patient presents with   Chest Pain   Referring Physician(s): Dr. Karie Fetch   Supervising Physician: Roanna Banning  Patient Status: [facility] - In-pt  History of Present Illness: [name] is a 78 y.o. female with a medical history significant for CAD, tobacco use (quit in 1998) who developed right-sided chest pain approximately one month ago. She sustained a fall and also had a cough with general malaise. She was evaluated by her PCP who ordered a CXR (02/21/23) which showed a right upper lobe opacity concerning for infection versus neoplasm. This was followed by a CT scan 03/20/23.  CT Chest 03/20/23 IMPRESSION: 1. 8.2 x 2.6 x 7.1 cm irregular, pleural-based mass laterally in the right upper chest with a minimal air or gas-filled cavitary component and predominantly low-density centrally, possibly necrotic or filled with fluid. This is difficult to assess due to extensive streak artifacts from spinal hardware. This could represent a necrotic malignancy or an infectious process with a empyema. 2. Patchy, linear and interstitial densities in the right upper lobe adjacent to and inferior to the pleural-based mass, likely due to infection. This is more extensive and pronounced than expected with lymphangitic spread of tumor. 3. Stable mildly enlarged right anterior paratracheal/precarinal lymph node since 06/11/2017, compatible with a benign lymph node. 4.  Calcific coronary artery and aortic atherosclerosis. 5. Colonic diverticulosis.  The patient presented to the ED 03/21/23 due to worsening chest pain. Pulmonary was consulted and the patient was started on empiric antibiotics. Interventional Radiology was asked to evaluate this patient for an image-guided right lung mass biopsy. Imaging reviewed and procedure approved by Dr. Loreta Ave.     Past Medical History:  Diagnosis Date   AAA (abdominal aortic  aneurysm) (HCC)    a. s/p stent graft repair 01/2016.   Acute ischemic colitis (HCC) 07/29/2010   Diagnosed June, 2012 characterized by acute lower GI bleeding, spontaneously resolved.    Acute respiratory infection 06/09/2014   Acute sinus infection 05/14/2015   Allergic rhinitis, cause unspecified    Angioedema of lips 06/03/2014   Anxiety state, unspecified    Arthritis of left hip 04/16/2015   Bilateral hearing loss 07/19/2017   Chronic LBP    Coronary artery disease    a. inferior MI 1998 s/p PCI of RCA. b. stenting of Cx 04/2010. c. DES to prox LAD 05/2013. d. DES x 3 in 10/2015. // Myoview 07/2018:  EF 59, normal perfusion, low risk    Coronary artery disease involving native heart without angina pectoris 07/31/2006   Qualifier: Diagnosis of  By: Debby Bud MD, Rosalyn Gess  ANGIOGRAPHIC DATA:  1. Ventriculography done in the RAO projection reveals a small wall  motion abnormality in the mid inferior wall. Ejection fraction  would be estimated at around 50%.  2. The right coronary artery has some progressive disease of about 60-  80% in the proximal mid segment. The distal vessel appears to be  50-70% narrowing althou   Degeneration of lumbar or lumbosacral intervertebral disc    Depressive disorder, not elsewhere classified    Diabetes (HCC) 11/03/2006   Qualifier: Diagnosis of  By: Jonny Ruiz MD, Len Blalock    Diabetes mellitus    TYPE II   Frequent urination 05/08/2014   Gout 08/22/2013   History of endovascular stent graft for abdominal aortic aneurysm (AAA) 01/22/2016   Hyperlipidemia    Hypertension    Hypothyroidism    Iron  deficiency anemia 06/13/2017   Lower GI bleed 06/2010   Diverticular bleed   Lumbar radiculopathy 05/15/2015   MENOPAUSAL DISORDER 10/29/2007   Qualifier: Diagnosis of  By: Jonny Ruiz MD, Len Blalock    Myalgia 09/25/2013   Noncompliance with medications 02/26/2014   Obesity, unspecified    OTITIS MEDIA, SEROUS, CHRONIC 04/23/2007   Qualifier: Diagnosis of  By: Debby Bud MD, Rosalyn Gess    Thoracic  disc disease 11/19/2018   URTICARIA 09/23/2009   Qualifier: Diagnosis of  By: Felicity Coyer MD, Vikki Ports A    Venous insufficiency 07/19/2017    Past Surgical History:  Procedure Laterality Date   CARDIAC CATHETERIZATION     PCI OF BOTH THE CIRCUMFLEX AND LEFT ANTERIOR DESCENDING ARTERY   CARDIAC CATHETERIZATION N/A 10/29/2015   Procedure: Coronary Stent Intervention;  Surgeon: Yvonne Kendall, MD;  Location: [facility] INVASIVE CV LAB;  Service: Cardiovascular;  Laterality: N/A;   CARDIAC CATHETERIZATION N/A 10/29/2015   Procedure: Coronary/Graft Angiography;  Surgeon: Yvonne Kendall, MD;  Location: [facility] INVASIVE CV LAB;  Service: Cardiovascular;  Laterality: N/A;   CARDIAC CATHETERIZATION N/A 10/29/2015   Procedure: Intravascular Pressure Wire/FFR Study;  Surgeon: Yvonne Kendall, MD;  Location: [facility] INVASIVE CV LAB;  Service: Cardiovascular;  Laterality: N/A;   CESAREAN SECTION     ENDOVASCULAR STENT INSERTION N/A 01/22/2016   Procedure: ABDOMINAL AORTIC ENDOVASCULAR STENT GRAFT INSERTION;  Surgeon: Larina Earthly, MD;  Location: [facility] OR;  Service: Vascular;  Laterality: N/A;   HEART STENT  04-2010  and  Jun 07, 2013   X 3   LEFT HEART CATH AND CORONARY ANGIOGRAPHY N/A 04/03/2019   Procedure: LEFT HEART CATH AND CORONARY ANGIOGRAPHY;  Surgeon: Yvonne Kendall, MD;  Location: [facility] INVASIVE CV LAB;  Service: Cardiovascular;  Laterality: N/A;   LEFT HEART CATHETERIZATION WITH CORONARY ANGIOGRAM N/A 06/07/2013   Procedure: LEFT HEART CATHETERIZATION WITH CORONARY ANGIOGRAM;  Surgeon: Kathleene Hazel, MD;  Location: [facility] CATH LAB;  Service: Cardiovascular;  Laterality: N/A;   LEFT HEART CATHETERIZATION WITH CORONARY ANGIOGRAM N/A 02/25/2014   Procedure: LEFT HEART CATHETERIZATION WITH CORONARY ANGIOGRAM;  Surgeon: Lennette Bihari, MD;  Location: [facility] CATH LAB;  Service: Cardiovascular;  Laterality: N/A;   LUMBAR FUSION  01/2007   DR. Noel Gerold...3-LEVEL WITH FIXATION   OOPHORECTOMY     BSO? pt.unsure   PARATHYROIDECTOMY      SPINE SURGERY     THYROIDECTOMY     TOTAL ABDOMINAL HYSTERECTOMY      Allergies: Crestor [rosuvastatin calcium], Prilosec [omeprazole], Miconazole nitrate, Augmentin [amoxicillin-pot clavulanate], and Doxycycline  Medications: Prior to Admission medications   Medication Sig Start Date End Date Taking? Authorizing Provider  aspirin 81 MG EC tablet Take 81 mg by mouth daily.    Yes [provider]  atenolol (TENORMIN) 25 MG tablet TAKE 1 TABLET BY MOUTH EVERY DAY. 01/20/23  Yes Corwin Levins, MD  gabapentin (NEURONTIN) 300 MG capsule Take 1-2 tab by mouth at bedtime for pain and sleep Patient taking differently: Take 300 mg by mouth as needed. Take 1-2 tab by mouth at bedtime for pain and sleep 04/22/21  Yes Corwin Levins, MD  levothyroxine (SYNTHROID) 137 MCG tablet Take 1 tablet (137 mcg total) by mouth daily before breakfast. 02/22/23  Yes Corwin Levins, MD  Triamcinolone Acetonide (ZILRETTA) 32 MG SRER intra-articular injection 5 mL, intra-articular, bilat knees 01/27/23  Yes Rodolph Bong, MD  allopurinol (ZYLOPRIM) 300 MG tablet Take 1 tablet (300 mg total) by mouth as needed (for Gout). Patient  not taking: Reported on 03/21/2023 10/26/20   Corwin Levins, MD  atorvastatin (LIPITOR) 40 MG tablet Take 1 tablet (40 mg total) by mouth daily. Patient not taking: Reported on 03/21/2023 10/07/22 01/05/23  Tonny Bollman, MD  furosemide (LASIX) 40 MG tablet Take 1 tablet (40 mg total) by mouth daily. Patient not taking: Reported on 03/21/2023 04/22/21   Corwin Levins, MD  nitroGLYCERIN (NITROSTAT) 0.4 MG SL tablet Place 1 tablet (0.4 mg total) under the tongue every 5 (five) minutes as needed for chest pain. Patient not taking: Reported on 03/21/2023 01/20/23   Tonny Bollman, MD  potassium chloride (KLOR-CON) 10 MEQ tablet Take 1 tablet (10 mEq total) by mouth daily. Patient not taking: Reported on 03/21/2023 02/22/23   Corwin Levins, MD  sulfamethoxazole-trimethoprim (BACTRIM DS) 800-160 MG tablet  Take 1 tablet by mouth 2 (two) times daily. Patient not taking: Reported on 03/21/2023 03/20/23   Corwin Levins, MD     Family History  Problem Relation Age of Onset   Heart attack Mother 75       s/p D&C-CARDIAC ARREST 1966   Heart disease Mother    Heart attack Father 59       44 WITH MI   Heart disease Father    Diabetes Brother    Diabetes Maternal Aunt    Arthritis Maternal Aunt    Breast cancer Maternal Aunt        Post menopausal   Colon cancer Neg Hx     Social History   Socioeconomic History   Marital status: Divorced    Spouse name: Not on file   Number of children: 3   Years of education: 13   Highest education level: Not on file  Occupational History   Occupation: RETIRED    Employer: [employer]    Comment: ADMIN SUPPORT    Employer: RETIRED  Tobacco Use   Smoking status: Former    Current packs/day: 0.00    Types: Cigarettes    Start date: 05/30/1956    Quit date: 05/31/1986    Years since quitting: 36.8   Smokeless tobacco: Never  Vaping Use   Vaping status: Never Used  Substance and Sexual Activity   Alcohol use: No   Drug use: No   Sexual activity: Not Currently    Comment: 1st intercourse 78 yo-Fewer than 5 partners  Other Topics Concern   Not on file  Social History Narrative   DIVORCED   3 CHILDREN   PATIENT SIGNED A DESIGNATED PARTY RELEASE TO ALLOW HER DAUGHTER, TRAMAINE Deike, TO HAVE ACCESS TO HER MEDICAL RECORDS/INFORMATION. Daphane Shepherd, May 04, 2009 @ 3:27 PM   Right handed   One story home   .   Social Drivers of Corporate investment banker Strain: Low Risk  (04/05/2022)   Overall Financial Resource Strain (CARDIA)    Difficulty of Paying Living Expenses: Not hard at all  Food Insecurity: No Food Insecurity (03/22/2023)   Hunger Vital Sign    Worried About Running Out of Food in the Last Year: Never true    Ran Out of Food in the Last Year: Never true  Transportation Needs: No Transportation Needs (03/22/2023)   PRAPARE  - Administrator, Civil Service (Medical): No    Lack of Transportation (Non-Medical): No  Physical Activity: Inactive (04/05/2022)   Exercise Vital Sign    Days of Exercise per Week: 0 days    Minutes of Exercise  per Session: 0 min  Stress: No Stress Concern Present (04/05/2022)   [employer] of Occupational Health - Occupational Stress Questionnaire    Feeling of Stress : Not at all  Social Connections: Socially Isolated (03/22/2023)   Social Connection and Isolation Panel [NHANES]    Frequency of Communication with Friends and Family: Twice a week    Frequency of Social Gatherings with Friends and Family: Once a week    Attends Religious Services: Never    Database administrator or Organizations: No    Attends Engineer, structural: Never    Marital Status: Divorced    Review of Systems: A 12 point ROS discussed and pertinent positives are indicated in the HPI above.  All other systems are negative.  Review of Systems  Musculoskeletal:        Right chest pain   All other systems reviewed and are negative.   Vital Signs: BP 107/63   Pulse (!) 52   Temp 98.2 F (36.8 C) (Oral)   Resp 18   Ht 5\' 6"  (1.676 m)   Wt 176 lb (79.8 kg)   SpO2 100%   BMI 28.41 kg/m   Physical Exam Constitutional:      General: She is not in acute distress.    Appearance: She is not ill-appearing.  HENT:     Mouth/Throat:     Mouth: Mucous membranes are moist.     Pharynx: Oropharynx is clear.  Cardiovascular:     Rate and Rhythm: Bradycardia present.  Pulmonary:     Effort: Pulmonary effort is normal.     Comments: Clubbed fingers  Abdominal:     Tenderness: There is no abdominal tenderness.  Skin:    General: Skin is warm and dry.  Neurological:     Mental Status: She is alert and oriented to person, place, and time.     Imaging: ECHOCARDIOGRAM COMPLETE Result Date: 03/22/2023    ECHOCARDIOGRAM REPORT   Patient Name:   [name] Date of Exam: 03/22/2023  Medical Rec #:  [id number]      Height:       66.0 in Accession #:    [id number]     Weight:       176.0 lb Date of Birth:  Mar 02, 1945       BSA:          1.894 m Patient Age:    77 years       BP:           134/64 mmHg Patient Gender: F              HR:           68 bpm. Exam Location:  Inpatient Procedure: 2D Echo, Cardiac Doppler and Color Doppler (Both Spectral and Color            Flow Doppler were utilized during procedure). Indications:    Chest Pain  History:        Patient has prior history of Echocardiogram examinations, most                 recent 02/03/2021. CAD; AAA.  Sonographer:    Amy Chionchio Referring Phys: Midge Minium, N IMPRESSIONS  1. Abnormal septal motion Strain and 3D EF not performed. Left ventricular ejection fraction, by estimation, is 50 to 55%. The left ventricle has low normal function. The left ventricle has no regional wall motion abnormalities. Left ventricular diastolic parameters were normal.  2.  Right ventricular systolic function is normal. The right ventricular size is normal.  3. The mitral valve is abnormal. Trivial mitral valve regurgitation. No evidence of mitral stenosis.  4. The aortic valve is tricuspid. There is moderate calcification of the aortic valve. There is moderate thickening of the aortic valve. Aortic valve regurgitation is not visualized. Aortic valve sclerosis/calcification is present, without any evidence of aortic stenosis.  5. The inferior vena cava is normal in size with greater than 50% respiratory variability, suggesting right atrial pressure of 3 mmHg. FINDINGS  Left Ventricle: Abnormal septal motion Strain and 3D EF not performed. Left ventricular ejection fraction, by estimation, is 50 to 55%. The left ventricle has low normal function. The left ventricle has no regional wall motion abnormalities. Strain was performed and the global longitudinal strain is indeterminate. The left ventricular internal cavity size was normal in size. There is no  left ventricular hypertrophy. Left ventricular diastolic parameters were normal. Right Ventricle: The right ventricular size is normal. No increase in right ventricular wall thickness. Right ventricular systolic function is normal. Left Atrium: Left atrial size was normal in size. Right Atrium: Right atrial size was normal in size. Pericardium: There is no evidence of pericardial effusion. Mitral Valve: The mitral valve is abnormal. There is mild thickening of the mitral valve leaflet(s). There is mild calcification of the mitral valve leaflet(s). Mild mitral annular calcification. Trivial mitral valve regurgitation. No evidence of mitral valve stenosis. MV peak gradient, 5.3 mmHg. The mean mitral valve gradient is 2.0 mmHg. Tricuspid Valve: The tricuspid valve is normal in structure. Tricuspid valve regurgitation is mild . No evidence of tricuspid stenosis. Aortic Valve: The aortic valve is tricuspid. There is moderate calcification of the aortic valve. There is moderate thickening of the aortic valve. Aortic valve regurgitation is not visualized. Aortic valve sclerosis/calcification is present, without any  evidence of aortic stenosis. Aortic valve mean gradient measures 4.0 mmHg. Aortic valve peak gradient measures 6.7 mmHg. Aortic valve area, by VTI measures 2.31 cm. Pulmonic Valve: The pulmonic valve was normal in structure. Pulmonic valve regurgitation is not visualized. No evidence of pulmonic stenosis. Aorta: The aortic root is normal in size and structure. Venous: The inferior vena cava is normal in size with greater than 50% respiratory variability, suggesting right atrial pressure of 3 mmHg. IAS/Shunts: No atrial level shunt detected by color flow Doppler. Additional Comments: 3D was performed not requiring image post processing on an independent workstation and was indeterminate.  LEFT VENTRICLE PLAX 2D LVIDd:         4.90 cm      Diastology LVIDs:         3.10 cm      LV e' medial:    4.57 cm/s LV PW:          0.80 cm      LV E/e' medial:  17.2 LV IVS:        0.80 cm      LV e' lateral:   9.36 cm/s LVOT diam:     2.00 cm      LV E/e' lateral: 8.4 LV SV:         60 LV SV Index:   32 LVOT Area:     3.14 cm  LV Volumes (MOD) LV vol d, MOD A2C: 105.0 ml LV vol d, MOD A4C: 91.8 ml LV vol s, MOD A4C: 42.4 ml LV SV MOD A4C:     91.8 ml RIGHT VENTRICLE  IVC RV Basal diam:  3.50 cm    IVC diam: 1.40 cm RV S prime:     8.05 cm/s TAPSE (M-mode): 1.5 cm LEFT ATRIUM             Index        RIGHT ATRIUM           Index LA Vol (A2C):   54.8 ml 28.93 ml/m  RA Area:     12.70 cm LA Vol (A4C):   67.0 ml 35.37 ml/m  RA Volume:   30.40 ml  16.05 ml/m LA Biplane Vol: 61.6 ml 32.52 ml/m  AORTIC VALVE                    PULMONIC VALVE AV Area (Vmax):    2.31 cm     PV Vmax:       0.62 m/s AV Area (Vmean):   2.36 cm     PV Peak grad:  1.5 mmHg AV Area (VTI):     2.31 cm AV Vmax:           129.00 cm/s AV Vmean:          88.000 cm/s AV VTI:            0.260 m AV Peak Grad:      6.7 mmHg AV Mean Grad:      4.0 mmHg LVOT Vmax:         95.00 cm/s LVOT Vmean:        66.100 cm/s LVOT VTI:          0.191 m LVOT/AV VTI ratio: 0.73  AORTA Ao Root diam: 2.60 cm Ao Asc diam:  2.70 cm MITRAL VALVE MV Area (PHT): 3.10 cm    SHUNTS MV Area VTI:   1.92 cm    Systemic VTI:  0.19 m MV Peak grad:  5.3 mmHg    Systemic Diam: 2.00 cm MV Mean grad:  2.0 mmHg MV Vmax:       1.15 m/s MV Vmean:      69.5 cm/s MV Decel Time: 245 msec MV E velocity: 78.60 cm/s MV A velocity: 89.60 cm/s MV E/A ratio:  0.88 Charlton Haws MD Electronically signed by Charlton Haws MD Signature Date/Time: 03/22/2023/4:35:38 PM    Final    DG Chest 2 View Result Date: 03/21/2023 CLINICAL DATA:  Intermittent chest pain EXAM: CHEST - 2 VIEW COMPARISON:  CT chest dated 03/20/2023, chest radiograph dated 02/21/2023 FINDINGS: Patient is rotated to the right. Normal lung volumes. Superolateral right lung opacity, better evaluated on recent chest CT. Minimal adjacent  interstitial opacities. No pleural effusion or pneumothorax. The heart size and mediastinal contours are within normal limits. No acute osseous abnormality. Partially imaged thoracolumbar spinal fixation hardware appears intact. Partially imaged aortic stent graft. IMPRESSION: Superolateral right lung opacity, better evaluated on recent chest CT. No new focal consolidations. Electronically Signed   By: Agustin Cree M.D.   On: 03/21/2023 19:57   CT Chest W Contrast Result Date: 03/20/2023 CLINICAL DATA:  Peripheral right upper lobe opacity which may be cavitary seen on chest radiographs dated 02/21/2023. Chest CT was recommended for further evaluation. EXAM: CT CHEST WITH CONTRAST TECHNIQUE: Multidetector CT imaging of the chest was performed during intravenous contrast administration. RADIATION DOSE REDUCTION: This exam was performed according to the departmental dose-optimization program which includes automated exposure control, adjustment of the mA and/or kV according to patient size and/or use of iterative reconstruction technique. CONTRAST:  75mL  ISOVUE-300 IOPAMIDOL (ISOVUE-300) INJECTION 61% COMPARISON:  Chest radiographs dated 02/21/2023 and 04/03/2019. Chest CTA dated 06/11/2017. FINDINGS: Cardiovascular: Atheromatous calcifications, including the coronary arteries and aorta. Normal-sized heart. No pericardial effusion. Mediastinum/Nodes: Surgically absent left lobe thyroid and isthmus. Normal-appearing right lobe. Mildly enlarged right anterior paratracheal/precarinal node with a short axis diameter of 14 mm on image number 135/10 without significant change since 06/11/2017. No interval enlarged lymph nodes elsewhere. Unremarkable trachea and esophagus. Lungs/Pleura: Irregular, pleural-based mass laterally in the right upper chest. This contains a minimal air or gas-filled cavitary component and is predominantly low-density centrally, possibly necrotic or filled with fluid. This is difficult to assess due  to extensive streak artifact from spinal hardware. This area measures 8.2 x 2.6 cm on image number 89/10. This measures 7.1 cm in length on coronal image number 97/4. No pleural fluid elsewhere on either side. There are patchy, linear and interstitial densities in the right upper lobe adjacent to and inferior to the pleural-based mass. Clear left lung.  No lung nodules seen. Upper Abdomen: Extensive streak artifacts from spine fixation hardware. Multiple small colonic diverticula. Oval area of low density in the left lobe of the liver measuring 4.2 x 3.3 cm on image number 297/10. This measures 19 Hounsfield units in density, including increased density associated with streak artifacts. This measured 2.7 x 2.1 cm and -4 Hounsfield units in density on 02/24/2017, compatible with a simple cyst, not requiring imaging follow-up. Musculoskeletal: Pedicle screw and rod fixation throughout the thoracic and lumbar spine from the upper thoracic spine down with associated extensive streak artifacts. Extensive thoracic and lower cervical spine degenerative changes. Grade 1 anterolisthesis in the lower thoracic spine with pedicle screw tracts compatible with removed screws. IMPRESSION: 1. 8.2 x 2.6 x 7.1 cm irregular, pleural-based mass laterally in the right upper chest with a minimal air or gas-filled cavitary component and predominantly low-density centrally, possibly necrotic or filled with fluid. This is difficult to assess due to extensive streak artifacts from spinal hardware. This could represent a necrotic malignancy or an infectious process with a empyema. 2. Patchy, linear and interstitial densities in the right upper lobe adjacent to and inferior to the pleural-based mass, likely due to infection. This is more extensive and pronounced than expected with lymphangitic spread of tumor. 3. Stable mildly enlarged right anterior paratracheal/precarinal lymph node since 06/11/2017, compatible with a benign lymph node. 4.   Calcific coronary artery and aortic atherosclerosis. 5. Colonic diverticulosis. Aortic Atherosclerosis (ICD10-I70.0). Electronically Signed   By: Beckie Salts M.D.   On: 03/20/2023 17:37   MM 3D SCREENING MAMMOGRAM BILATERAL BREAST Result Date: 03/17/2023 CLINICAL DATA:  Screening. EXAM: DIGITAL SCREENING BILATERAL MAMMOGRAM WITH TOMOSYNTHESIS AND CAD TECHNIQUE: Bilateral screening digital craniocaudal and mediolateral oblique mammograms were obtained. Bilateral screening digital breast tomosynthesis was performed. The images were evaluated with computer-aided detection. COMPARISON:  Previous exam(s). ACR Breast Density Category a: The breasts are almost entirely fatty. FINDINGS: There are no findings suspicious for malignancy. IMPRESSION: No mammographic evidence of malignancy. A result letter of this screening mammogram will be mailed directly to the patient. RECOMMENDATION: Screening mammogram in one year. (Code:SM-B-01Y) BI-RADS CATEGORY  1: Negative. Electronically Signed   By: Frederico Hamman M.D.   On: 03/17/2023 15:17    Labs:  CBC: Recent Labs    03/21/23 1837 03/22/23 0642 03/23/23 0743 03/24/23 0747  WBC 8.7 9.0 9.6 8.0  HGB 14.2 12.1 12.2 12.4  HCT 46.1* 38.5 38.4 39.2  PLT 306 284 290 317  COAGS: No results for input(s): "INR", "APTT" in the last 8760 hours.  BMP: Recent Labs    03/21/23 1837 03/22/23 0642 03/23/23 0743 03/24/23 0747  NA 141 139 137 137  K 3.3* 3.0* 3.6 3.8  CL 102 105 103 103  CO2 25 28 26 27   GLUCOSE 78 124* 87 81  BUN <5* <5* 5* 6*  CALCIUM 9.0 8.1* 8.0* 7.7*  CREATININE 0.64 0.79 0.84 0.60  GFRNONAA >60 >60 >60 >60    LIVER FUNCTION TESTS: Recent Labs    08/04/22 1453 10/05/22 0818 02/21/23 1424 03/22/23 0642  BILITOT 0.5 0.6 0.8 0.6  AST 15 10 14 15   ALT 7 6 9 9   ALKPHOS 99 113 109 91  PROT 8.0 7.1 7.6 6.3*  ALBUMIN 3.8 4.0 3.3* 2.4*    TUMOR MARKERS: No results for input(s): "AFPTM", "CEA", "CA199", "CHROMGRNA" in the  last 8760 hours.  Assessment and Plan:  Right lung  mass: [name], 78 year old female, is tentatively scheduled 03/27/23 for an image-guided right lung mass biopsy.   Risks and benefits of CT guided lung nodule/mass biopsy were discussed with the patient including, but not limited to bleeding, hemoptysis, respiratory failure requiring intubation, infection, pneumothorax requiring chest tube placement, stroke from air embolism or even death.  All of the patient's questions were answered and the patient is agreeable to proceed. She will be NPO at midnight 03/27/23. Her last dose of aspirin was prior to admission on 03/21/23.  Consent signed and in chart.  Thank you for this interesting consult.  I greatly enjoyed meeting [name] and look forward to participating in their care.  A copy of this report was sent to the requesting provider on this date.  Electronically Signed: Alwyn Ren, AGACNP-BC 03/24/2023, 1:45 PM   I spent a total of 20 Minutes    in face to face in clinical consultation, greater than 50% of which was counseling/coordinating care for right lung mass.

## 2023-03-24 NOTE — Care Management Important Message (Signed)
 Important Message  Patient Details  Name: [name] MRN: [id number] Date of Birth: Jun 07, 1945   Important Message Given:  Yes - Medicare IM     Dorena Bodo 03/24/2023, 2:42 PM

## 2023-03-25 DIAGNOSIS — R918 Other nonspecific abnormal finding of lung field: Secondary | ICD-10-CM | POA: Diagnosis not present

## 2023-03-25 LAB — BASIC METABOLIC PANEL
Anion gap: 7 (ref 5–15)
BUN: 7 mg/dL — ABNORMAL LOW (ref 8–23)
CO2: 29 mmol/L (ref 22–32)
Calcium: 8.2 mg/dL — ABNORMAL LOW (ref 8.9–10.3)
Chloride: 105 mmol/L (ref 98–111)
Creatinine, Ser: 0.77 mg/dL (ref 0.44–1.00)
GFR, Estimated: >60 mL/min (ref >60)
Glucose, Bld: 86 mg/dL (ref 70–99)
Potassium: 3.8 mmol/L (ref 3.5–5.1)
Sodium: 141 mmol/L (ref 135–145)

## 2023-03-25 LAB — CBC
HCT: 38.6 % (ref 36.0–46.0)
Hemoglobin: 12.3 g/dL (ref 12.0–15.0)
MCH: 27.3 pg (ref 26.0–34.0)
MCHC: 31.9 g/dL (ref 30.0–36.0)
MCV: 85.8 fL (ref 80.0–100.0)
Platelets: 308 K/uL (ref 150–400)
RBC: 4.5 MIL/uL (ref 3.87–5.11)
RDW: 15 % (ref 11.5–15.5)
WBC: 8.8 K/uL (ref 4.0–10.5)
nRBC: 0 % (ref 0.0–0.2)

## 2023-03-25 NOTE — Progress Notes (Signed)
 PROGRESS NOTE [name]  QMV:[id number] DOB: 1945-12-10 DOA: 03/21/2023 PCP: Corwin Levins, MD  Brief Narrative/Hospital Course: 55 yof w/ hx of CAD s/p multiple PCI's, diastolic CHF, diabetes mellitus presently not on meds, hypothyroidism presented to the ED with complaint of chest pain on and off x 3 wks, and found to have  RUL lung lesion in outpatient CT chest and concerning for necrotizing neoplastic lesion versus pneumonia.Patient states she also had a fall and hit her right side of the chest following which she had more chest pain.  In the ED vitals stable with labs unremarkable troponin negative x 2 lactic acid normal and patient was admitted for further management of chest pain and lung mass workup. Seen by cardiology> echo obtained stable unremarkable and not pursuing cardiac cath at this time. Seen by pulmonary> and referred to IR for lung biopsy  Procedure: CT Chest 3/3> 8.2 x 2.6 x 7.1 cm irregular, pleural-based mass laterally in the right upper chest with a minimal air or gas-filled cavitary component and predominantly low-density centrally, possibly necrotic or filled with fluid-necrotic malignancy or infectious process with empyema.  Stable paratracheal/precarinal lymph nodes calcific aortic and coronary atherosclerosis.  Consultation: Cardiology PCCM IR   Subjective: Seen and examined Complains of right-sided chest pain but improves with Percocet Overnight afebrile BP stable on room air   Assessment and Plan: Principal Problem:   Lung mass Active Problems:   Hypothyroidism   Diabetes (HCC)   Mixed hyperlipidemia   Essential hypertension   Abdominal aortic aneurysm (AAA) without rupture   Coronary artery disease   Chronic heart failure with preserved ejection fraction (HFpEF) (HCC)   Right-sided chest pain   Hyperlipidemia with target low density lipoprotein (LDL) cholesterol less than 55 mg/dL   Hypokalemia   Abnormal chest CT   Chest pain x 3 weeks RUL  Lung mass Possible pneumonia: Significant mass noticed on outpatient CT chest: Differential includes lung mass/malignancy/infarct/pneumonia-but no classical infectious symptoms or signs and no PE.  Managing with empiric ceftriaxone/azithromycin> switch to p.o to complete course x 5 days  Appreciate pulmonary input>planning for CT-guided biopsy, awaiting aspirin washout-biopsy arranged for 3/10 patient is persistent on getting biopsy as inpatient,she lives alone.   Diastolic CHF: Compensated continue atenolol.  Compensated on admission. PTA on prn lasix.   CAD s/p stents Chest pain: Seen by cardiology, cleared right-sided and  pain-likely musculoskeletal and feels better with pain management TE 3/5-showed EF 50-55% no RWMA RV SF normal RV size normal. No plan for cardiac cath.Holding aspirin for lung mass biopsy. Continue atenolol, Lipitor   Hypothyroidism:  TSH low at 0.06 and FT4 elevated 1.16 likely iatrogenic Synthroid dose decreased-rechekc tsh/ft4 in 3 wk  AAA hx:  Stable.  No abdominal pain currently.   Hypokalemia: Resolved.    DVT prophylaxis: SCDs Start: 03/22/23 0504 Code Status:   Code Status: Full Code Family Communication: plan of care discussed with patient/none at bedside. Patient status is: Remains hospitalized because of severity of illness Level of care: Telemetry Medical   Dispo: The patient is from: Home lives alone            Anticipated disposition: TBD Objective: Vitals last 24 hrs: Vitals:   03/24/23 1525 03/24/23 1924 03/25/23 0427 03/25/23 0931  BP: (!) 109/56 102/60 118/72 118/72  Pulse: 69 70 65 65  Resp: 18 16 18    Temp: 98 F (36.7 C) (!) 97.5 F (36.4 C) 98.5 F (36.9 C)   TempSrc: Oral Oral Oral  SpO2: 98% 100% 100%   Weight:      Height:       Weight change:   Physical Examination: General exam: alert awake, oriented HEENT:Oral mucosa moist, Ear/Nose WNL grossly Respiratory system: Bilaterally clear BS,no use of accessory  muscle Cardiovascular system: S1 & S2 +, No JVD. Gastrointestinal system: Abdomen soft,NT,ND, BS+ Nervous System: Alert, awake, moving all extremities,and following commands. Extremities: LE edema neg,distal peripheral pulses palpable and warm.  Skin: No rashes,no icterus. MSK: Normal muscle bulk,tone, power   Medications reviewed:  Scheduled Meds:  atenolol  25 mg Oral Daily   atorvastatin  40 mg Oral Daily   azithromycin  500 mg Oral QHS   cefUROXime  500 mg Oral BID WC   levothyroxine  100 mcg Oral QAC breakfast   Continuous Infusions:     Diet Order             Diet NPO time specified Except for: Sips with Meds  Diet effective midnight           Diet regular Room service appropriate? Yes; Fluid consistency: Thin  Diet effective now                 No intake or output data in the 24 hours ending 03/25/23 0940  Net IO Since Admission: 443.52 mL [03/25/23 0940]  Wt Readings from Last 3 Encounters:  03/21/23 79.8 kg  03/02/23 79.9 kg  02/21/23 79.4 kg     Unresulted Labs (From admission, onward)     Start     Ordered   03/27/23 0500  Protime-INR  Tomorrow morning,   R       Question:  Specimen collection method  Answer:  Lab=Lab collect   03/24/23 1258   03/23/23 0500  Basic metabolic panel  Daily,   R      03/22/23 1117   03/23/23 0500  CBC  Daily,   R      03/22/23 1117   03/22/23 0507  Expectorated Sputum Assessment w Gram Stain, Rflx to Resp Cult  Once,   R        03/22/23 0507          Data Reviewed: I have personally reviewed following labs and imaging studies CBC: Recent Labs  Lab 03/21/23 1837 03/22/23 0642 03/23/23 0743 03/24/23 0747 03/25/23 0732  WBC 8.7 9.0 9.6 8.0 8.8  NEUTROABS  --  5.8  --   --   --   HGB 14.2 12.1 12.2 12.4 12.3  HCT 46.1* 38.5 38.4 39.2 38.6  MCV 88.3 87.9 85.7 86.5 85.8  PLT 306 284 290 317 308   Basic Metabolic Panel:  Recent Labs  Lab 03/21/23 1837 03/22/23 0642 03/23/23 0743 03/24/23 0747  03/25/23 0732  NA 141 139 137 137 141  K 3.3* 3.0* 3.6 3.8 3.8  CL 102 105 103 103 105  CO2 25 28 26 27 29   GLUCOSE 78 124* 87 81 86  BUN <5* <5* 5* 6* 7*  CREATININE 0.64 0.79 0.84 0.60 0.77  CALCIUM 9.0 8.1* 8.0* 7.7* 8.2*  MG  --  1.7 2.0  --   --    GFR: Estimated Creatinine Clearance: 62.8 mL/min (by C-G formula based on SCr of 0.77 mg/dL). Liver Function Tests:  Recent Labs  Lab 03/22/23 0642  AST 15  ALT 9  ALKPHOS 91  BILITOT 0.6  PROT 6.3*  ALBUMIN 2.4*   No results for input(s): "CHOL", "HDL", "LDLCALC", "TRIG", "CHOLHDL", "LDLDIRECT" in  the last 72 hours.  No results for input(s): "TSH", "T4TOTAL", "FREET4", "T3FREE", "THYROIDAB" in the last 72 hours.  Sepsis Labs: Recent Labs  Lab 03/21/23 2113 03/22/23 0056  LATICACIDVEN 1.0 0.8   Recent Results (from the past 240 hours)  Blood culture (routine x 2)     Status: None (Preliminary result)   Collection Time: 03/21/23  9:00 PM   Specimen: BLOOD RIGHT WRIST  Result Value Ref Range Status   Specimen Description BLOOD RIGHT WRIST  Final   Special Requests   Final    BOTTLES DRAWN AEROBIC ONLY Blood Culture results may not be optimal due to an inadequate volume of blood received in culture bottles   Culture   Final    NO GROWTH 3 DAYS Performed at [facility], [address] [place], [place] [address]    Report Status PENDING  Incomplete  Blood culture (routine x 2)     Status: None (Preliminary result)   Collection Time: 03/21/23  9:13 PM   Specimen: BLOOD  Result Value Ref Range Status   Specimen Description BLOOD RIGHT ANTECUBITAL  Final   Special Requests   Final    BOTTLES DRAWN AEROBIC AND ANAEROBIC Blood Culture results may not be optimal due to an inadequate volume of blood received in culture bottles   Culture   Final    NO GROWTH 3 DAYS Performed at [facility], [address] [place], [place] [address]    Report Status PENDING  Incomplete     Antimicrobials/Microbiology: Anti-infectives (From admission, onward)    Start     Dose/Rate Route Frequency Ordered Stop   03/24/23 2200  cefUROXime (CEFTIN) tablet 500 mg        500 mg Oral 2 times daily with meals 03/24/23 1350 03/28/23 0729   03/22/23 2200  cefTRIAXone (ROCEPHIN) 2 g in sodium chloride 0.9 % 100 mL IVPB  Status:  Discontinued        2 g 200 mL/hr over 30 Minutes Intravenous Every 24 hours 03/22/23 0507 03/24/23 1350   03/22/23 2200  azithromycin (ZITHROMAX) 500 mg in sodium chloride 0.9 % 250 mL IVPB  Status:  Discontinued        500 mg 250 mL/hr over 60 Minutes Intravenous Every 24 hours 03/22/23 0507 03/22/23 1611   03/22/23 2200  azithromycin (ZITHROMAX) tablet 500 mg        500 mg Oral Daily at bedtime 03/22/23 1611 03/26/23 2159   03/21/23 1930  cefTRIAXone (ROCEPHIN) 2 g in sodium chloride 0.9 % 100 mL IVPB        2 g 200 mL/hr over 30 Minutes Intravenous  Once 03/21/23 1916 03/21/23 2150   03/21/23 1930  azithromycin (ZITHROMAX) 500 mg in sodium chloride 0.9 % 250 mL IVPB        500 mg 250 mL/hr over 60 Minutes Intravenous  Once 03/21/23 1916 03/21/23 2339         Component Value Date/Time   SDES BLOOD RIGHT ANTECUBITAL 03/21/2023 2113   SPECREQUEST  03/21/2023 2113    BOTTLES DRAWN AEROBIC AND ANAEROBIC Blood Culture results may not be optimal due to an inadequate volume of blood received in culture bottles   CULT  03/21/2023 2113    NO GROWTH 3 DAYS Performed at [facility], [address], [place], [place] [address]    REPTSTATUS PENDING 03/21/2023 2113     Radiology Studies: No results found.    LOS: 4 days  Total time spent in review of labs and imaging, patient evaluation, formulation of plan, documentation and communication with family: 35 minutes  Lanae Boast, MD  [facility]  03/25/2023, 9:40 AM

## 2023-03-25 NOTE — Plan of Care (Signed)

## 2023-03-25 NOTE — Progress Notes (Signed)
   Dr Landry Dyke rounding note reviewed. Reproducible chest pain not consistent with cardiac etiology, there were no plans for repeat ischemic testing or additional cardiac workup per prior rounding notes. No additional cardiology recs at this time, we will follow peripherally over the weekend.     Joanie Coddington, MD  03/25/2023, 8:28 AM

## 2023-03-26 DIAGNOSIS — R0789 Other chest pain: Secondary | ICD-10-CM | POA: Diagnosis not present

## 2023-03-26 DIAGNOSIS — R918 Other nonspecific abnormal finding of lung field: Secondary | ICD-10-CM | POA: Diagnosis not present

## 2023-03-26 LAB — CULTURE, BLOOD (ROUTINE X 2)
Culture: NO GROWTH
Culture: NO GROWTH

## 2023-03-26 LAB — BASIC METABOLIC PANEL
Anion gap: 10 (ref 5–15)
BUN: 6 mg/dL — ABNORMAL LOW (ref 8–23)
CO2: 26 mmol/L (ref 22–32)
Calcium: 8.4 mg/dL — ABNORMAL LOW (ref 8.9–10.3)
Chloride: 104 mmol/L (ref 98–111)
Creatinine, Ser: 0.63 mg/dL (ref 0.44–1.00)
GFR, Estimated: >60 mL/min (ref >60)
Glucose, Bld: 72 mg/dL (ref 70–99)
Potassium: 3.9 mmol/L (ref 3.5–5.1)
Sodium: 140 mmol/L (ref 135–145)

## 2023-03-26 LAB — PROTIME-INR
INR: 1.1 (ref 0.8–1.2)
Prothrombin Time: 14.9 s (ref 11.4–15.2)

## 2023-03-26 LAB — CBC
HCT: 40.1 % (ref 36.0–46.0)
Hemoglobin: 12.8 g/dL (ref 12.0–15.0)
MCH: 27.5 pg (ref 26.0–34.0)
MCHC: 31.9 g/dL (ref 30.0–36.0)
MCV: 86.1 fL (ref 80.0–100.0)
Platelets: 316 10*3/uL (ref 150–400)
RBC: 4.66 MIL/uL (ref 3.87–5.11)
RDW: 15.1 % (ref 11.5–15.5)
WBC: 8.2 10*3/uL (ref 4.0–10.5)
nRBC: 0 % (ref 0.0–0.2)

## 2023-03-26 LAB — TROPONIN I (HIGH SENSITIVITY): Troponin I (High Sensitivity): 7 ng/L (ref <18)

## 2023-03-26 MED ORDER — MORPHINE SULFATE (PF) 2 MG/ML IV SOLN
2.0000 mg | INTRAVENOUS | Status: DC | PRN
  Administered 2023-03-26 – 2023-03-27 (×2): 2 mg via INTRAVENOUS
  Filled 2023-03-26 (×2): qty 1

## 2023-03-26 MED ORDER — ISOSORBIDE MONONITRATE ER 30 MG PO TB24
15.0000 mg | ORAL_TABLET | Freq: Every day | ORAL | Status: DC
  Administered 2023-03-26: 15 mg via ORAL
  Filled 2023-03-26 (×2): qty 1

## 2023-03-26 NOTE — Plan of Care (Signed)
   Problem: Activity: Goal: Risk for activity intolerance will decrease Outcome: Progressing   Problem: Nutrition: Goal: Adequate nutrition will be maintained Outcome: Progressing   Problem: Coping: Goal: Level of anxiety will decrease Outcome: Progressing   Problem: Safety: Goal: Ability to remain free from injury will improve Outcome: Progressing

## 2023-03-26 NOTE — Progress Notes (Signed)
 Rounding Note    Patient Name: [name] Date of Encounter: 03/26/2023   [facility] Cardiologist: Tonny Bollman, MD   Subjective   Some recurrent chest pain today  Inpatient Medications    Scheduled Meds:  atenolol  25 mg Oral Daily   atorvastatin  40 mg Oral Daily   cefUROXime  500 mg Oral BID WC   levothyroxine  100 mcg Oral QAC breakfast   Continuous Infusions:  PRN Meds: HYDROcodone-acetaminophen, morphine injection, nitroGLYCERIN   Vital Signs    Vitals:   03/25/23 0931 03/26/23 0500 03/26/23 0841 03/26/23 0843  BP: 118/72 127/69 106/66 106/66  Pulse: 65 61 62 62  Resp:  18 18   Temp:  98.6 F (37 C) 98.3 F (36.8 C)   TempSrc:  Oral    SpO2:  100% 100%   Weight:      Height:        Intake/Output Summary (Last 24 hours) at 03/26/2023 1115 Last data filed at 03/25/2023 1737 Gross per 24 hour  Intake 780 ml  Output --  Net 780 ml      03/21/2023    5:33 PM 03/02/2023    9:53 AM 02/21/2023    1:23 PM  Last 3 Weights  Weight (lbs) 176 lb 176 lb 3.2 oz 175 lb  Weight (kg) 79.833 kg 79.924 kg 79.379 kg      Telemetry    NSR - Personally Reviewed  ECG    N/a - Personally Reviewed  Physical Exam   GEN: No acute distress.   Neck: No JVD Cardiac: RRR, no murmurs, rubs, or gallops.  Respiratory: Clear to auscultation bilaterally. GI: Soft, nontender, non-distended  MS: No edema; No deformity. Neuro:  Nonfocal  Psych: Normal affect   Labs    High Sensitivity Troponin:   Recent Labs  Lab 03/21/23 1837 03/21/23 2113  TROPONINIHS 7 5     Chemistry Recent Labs  Lab 03/22/23 0642 03/23/23 0743 03/24/23 0747 03/25/23 0732 03/26/23 0804  NA 139 137 137 141 140  K 3.0* 3.6 3.8 3.8 3.9  CL 105 103 103 105 104  CO2 28 26 27 29 26   GLUCOSE 124* 87 81 86 72  BUN <5* 5* 6* 7* 6*  CREATININE 0.79 0.84 0.60 0.77 0.63  CALCIUM 8.1* 8.0* 7.7* 8.2* 8.4*  MG 1.7 2.0  --   --   --   PROT 6.3*  --   --   --   --   ALBUMIN 2.4*  --    --   --   --   AST 15  --   --   --   --   ALT 9  --   --   --   --   ALKPHOS 91  --   --   --   --   BILITOT 0.6  --   --   --   --   GFRNONAA >60 >60 >60 >60 >60  ANIONGAP 6 8 7 7 10     Lipids  Recent Labs  Lab 03/22/23 0642  CHOL 119  TRIG 62  HDL 22*  LDLCALC 85  CHOLHDL 5.4    Hematology Recent Labs  Lab 03/24/23 0747 03/25/23 0732 03/26/23 0804  WBC 8.0 8.8 8.2  RBC 4.53 4.50 4.66  HGB 12.4 12.3 12.8  HCT 39.2 38.6 40.1  MCV 86.5 85.8 86.1  MCH 27.4 27.3 27.5  MCHC 31.6 31.9 31.9  RDW 14.9 15.0 15.1  PLT  317 308 316   Thyroid  Recent Labs  Lab 03/22/23 0641 03/22/23 0642  TSH  --  0.067*  FREET4 1.16*  --     BNPNo results for input(s): "BNP", "PROBNP" in the last 168 hours.  DDimer No results for input(s): "DDIMER" in the last 168 hours.   Radiology    No results found.  Cardiac Studies     Patient Profile     78 y.o. female with a hx of  Coronary artery disease  s/p prior inf MI in 1998 tx with stent to the RCA S/p stent LCx in 2012 S/p stent to the LAD in 2015 S/p DES x 3 to the RCA 2017 Myoview 07/2018: low risk Cath 03/2019: dRCA diff dz'd supplying small/dz'd PDA+PL br (not ideal for PCI) >> med Rx (HFpEF) heart failure with preserved ejection fraction  Diabetes mellitus  Hypertension Hyperlipidemia AAA S/p EVAR 01/2016 who is being seen 03/22/2023 for the evaluation of chest pain at the request of Dr. Lanae Boast.   Assessment & Plan    1.CAD/chest pain - history of prior PCIs as listed above - last cath 03/2019 diffuse RCA small vessel disease, not ideal for PCI, managed medically. Otherwise stable small vessel disease.  - 06/2022 nuclear stress: no ischemia   -presented with chest pain, from notes better with NG at home but also positional, reproducible.  - recent fall on right side 2 weeks ago with pain since.  - trops neg x 2 this admission, EKG without acute ischemic changes - 03/2023 echo: LVEF 50-55%, no WMAs - thought MSK  pain on admission, there were no plans for ischemic testing  - called today for recurrent chest pain. SHe has 2 pains. Right sided pain is clearly not cardiac. She has a midchest sharp pain 8/10 today, no other associated symptoms. On my history she reports worst when she would press on it. This has improved with morphine. Lasted just a few minutes.  - EKG benign, repeat enzymes pending.   - overall symptoms remain atypical - lung mass needing biopsy scheduled for 03/27/23. In absence of objective evidence of ischemia would not plan for ischemic testing at this time. Titrate antianginal therapy, manage MSK related pain. Will add imdur 15mg  daily. ASA on old for biopsy, other medical therapy with atenolol 25, atorva 40.  - some soft bp's, norvasc 2.5mg  was stopped. See if can tolerate imdur.    2. Lung mass - 03/2023 CT: 8.2 x 2.6 x 7.1 cm irregular, pleural-based mass laterally in the right upper chest with a minimal air or gas-filled cavitary component and predominantly low-density centrally, possibly necrotic or filled with fluid.   - emperically being treated for possible pneumonia - plans for biopsy 03/27/23 after ASA washout.         For questions or updates, please contact [facility] Please consult [email or web address] for contact info under        Signed, Dina Rich, MD  03/26/2023, 11:15 AM

## 2023-03-26 NOTE — Progress Notes (Signed)
 PROGRESS NOTE [name]  ZOX:[id number] DOB: March 18, 1945 DOA: 03/21/2023 PCP: Corwin Levins, MD  Brief Narrative/Hospital Course: 77 yof w/ hx of CAD s/p multiple PCI's, diastolic CHF, diabetes mellitus presently not on meds, hypothyroidism presented to the ED with complaint of chest pain on and off x 3 wks, and found to have  RUL lung lesion in outpatient CT chest and concerning for necrotizing neoplastic lesion versus pneumonia.Patient states she also had a fall and hit her right side of the chest following which she had more chest pain.  In the ED vitals stable with labs unremarkable troponin negative x 2 lactic acid normal and patient was admitted for further management of chest pain and lung mass workup. Seen by cardiology> echo obtained stable unremarkable and not pursuing cardiac cath at this time. Seen by pulmonary> and referred to IR for lung biopsy  Procedure: CT Chest 3/3> 8.2 x 2.6 x 7.1 cm irregular, pleural-based mass laterally in the right upper chest with a minimal air or gas-filled cavitary component and predominantly low-density centrally, possibly necrotic or filled with fluid-necrotic malignancy or infectious process with empyema.  Stable paratracheal/precarinal lymph nodes calcific aortic and coronary atherosclerosis.  Consultation: Cardiology PCCM IR   Subjective: Patient seen and examined Complains of right-sided chest pain also substernal which is worse-did not improve on oxycodone, morphine ordered, EKG reviewed no ischemic changes troponin order cardiology notified due to substernal chest pain. Overnight afebrile BP stable   Assessment and Plan: Principal Problem:   Lung mass Active Problems:   Hypothyroidism   Diabetes (HCC)   Mixed hyperlipidemia   Essential hypertension   Abdominal aortic aneurysm (AAA) without rupture   Coronary artery disease   Chronic heart failure with preserved ejection fraction (HFpEF) (HCC)   Right-sided chest pain    Hyperlipidemia with target low density lipoprotein (LDL) cholesterol less than 55 mg/dL   Hypokalemia   Abnormal chest CT   Chest pain x 3 weeks RUL Lung mass Possible pneumonia: Significant mass noticed on outpatient CT chest: Differential includes lung mass/malignancy/infarct/pneumonia-but no classical infectious symptoms or signs and no PE.  Managing with empiric ceftriaxone/azithromycin> switched to p.o to complete course x 5 days  Appreciate pulmonary input>planning for CT-guided biopsy, awaiting aspirin washout and CT guided biopsy arranged for 3/10 patient is persistent on getting biopsy as inpatient,she lives alone.   Diastolic CHF: Compensated continue atenolol.  Compensated on admission. PTA on prn lasix.   CAD s/p stents Chest pain: Seen by cardiology, chest pain is reproducible and not consistent with cardiac etiology.this morning complains of worsening substernal chest pain although tender on exam, does have right-sided chest wall pain reproducible with palpation.substernal pain not improved on oxycodone , ordered IV morphine, EKG obtained nonischemic.  Troponin ordered cardiology notified  TTE 3/5-showed EF 50-55% no RWMA RV SF normal RV size normal. No plan for cardiac cath.Holding aspirin for lung mass biopsy. Continue atenolol, Lipitor .  No aspirin due to plan for biopsy.  Hypothyroidism:  TSH low at 0.06 and FT4 elevated 1.16 likely iatrogenic Synthroid dose decreased-rechekc tsh/ft4 in 3 wk  AAA hx:  Stable.  No abdominal pain currently.   Hypokalemia: Resolved.    DVT prophylaxis: SCDs Start: 03/22/23 0504 Code Status:   Code Status: Full Code Family Communication: plan of care discussed with patient/none at bedside. Patient status is: Remains hospitalized because of severity of illness Level of care: Telemetry Medical   Dispo: The patient is from: Home lives alone  Anticipated disposition: TBD Objective: Vitals last 24 hrs: Vitals:   03/25/23  0931 03/26/23 0500 03/26/23 0841 03/26/23 0843  BP: 118/72 127/69 106/66 106/66  Pulse: 65 61 62 62  Resp:  18 18   Temp:  98.6 F (37 C) 98.3 F (36.8 C)   TempSrc:  Oral    SpO2:  100% 100%   Weight:      Height:       Weight change:   Physical Examination: General exam: alert awake, obese HEENT:Oral mucosa moist, Ear/Nose WNL grossly Respiratory system: Bilaterally clear BS,no use of accessory muscle.  Chest wall is tender to palpation on right side and sternal area. Cardiovascular system: S1 & S2 +, No JVD. Gastrointestinal system: Abdomen soft,NT,ND, BS+ Nervous System: Alert, awake, moving all extremities,and following commands. Extremities: LE edema neg,distal peripheral pulses palpable and warm.  Skin: No rashes,no icterus. MSK: Normal muscle bulk,tone, power   Medications reviewed:  Scheduled Meds:  atenolol  25 mg Oral Daily   atorvastatin  40 mg Oral Daily   cefUROXime  500 mg Oral BID WC   levothyroxine  100 mcg Oral QAC breakfast   Continuous Infusions:   Diet Order             Diet NPO time specified Except for: Sips with Meds  Diet effective midnight           Diet NPO time specified Except for: Sips with Meds  Diet effective midnight           Diet regular Room service appropriate? Yes; Fluid consistency: Thin  Diet effective now                  Intake/Output Summary (Last 24 hours) at 03/26/2023 0856 Last data filed at 03/25/2023 1737 Gross per 24 hour  Intake 780 ml  Output --  Net 780 ml    Net IO Since Admission: 1,463.52 mL [03/26/23 0856]  Wt Readings from Last 3 Encounters:  03/21/23 79.8 kg  03/02/23 79.9 kg  02/21/23 79.4 kg     Unresulted Labs (From admission, onward)     Start     Ordered   03/27/23 0500  Protime-INR  Tomorrow morning,   R       Question:  Specimen collection method  Answer:  Lab=Lab collect   03/24/23 1258   03/23/23 0500  Basic metabolic panel  Daily,   R      03/22/23 1117   03/23/23 0500  CBC  Daily,    R      03/22/23 1117   03/22/23 0507  Expectorated Sputum Assessment w Gram Stain, Rflx to Resp Cult  Once,   R        03/22/23 0507          Data Reviewed: I have personally reviewed following labs and imaging studies CBC: Recent Labs  Lab 03/22/23 0642 03/23/23 0743 03/24/23 0747 03/25/23 0732 03/26/23 0804  WBC 9.0 9.6 8.0 8.8 8.2  NEUTROABS 5.8  --   --   --   --   HGB 12.1 12.2 12.4 12.3 12.8  HCT 38.5 38.4 39.2 38.6 40.1  MCV 87.9 85.7 86.5 85.8 86.1  PLT 284 290 317 308 316   Basic Metabolic Panel:  Recent Labs  Lab 03/21/23 1837 03/22/23 0642 03/23/23 0743 03/24/23 0747 03/25/23 0732  NA 141 139 137 137 141  K 3.3* 3.0* 3.6 3.8 3.8  CL 102 105 103 103 105  CO2 25 28  26 27 29   GLUCOSE 78 124* 87 81 86  BUN <5* <5* 5* 6* 7*  CREATININE 0.64 0.79 0.84 0.60 0.77  CALCIUM 9.0 8.1* 8.0* 7.7* 8.2*  MG  --  1.7 2.0  --   --    GFR: Estimated Creatinine Clearance: 62.8 mL/min (by C-G formula based on SCr of 0.77 mg/dL). Liver Function Tests:  Recent Labs  Lab 03/22/23 0642  AST 15  ALT 9  ALKPHOS 91  BILITOT 0.6  PROT 6.3*  ALBUMIN 2.4*   No results for input(s): "CHOL", "HDL", "LDLCALC", "TRIG", "CHOLHDL", "LDLDIRECT" in the last 72 hours.  No results for input(s): "TSH", "T4TOTAL", "FREET4", "T3FREE", "THYROIDAB" in the last 72 hours.  Sepsis Labs: Recent Labs  Lab 03/21/23 2113 03/22/23 0056  LATICACIDVEN 1.0 0.8   Recent Results (from the past 240 hours)  Blood culture (routine x 2)     Status: None   Collection Time: 03/21/23  9:00 PM   Specimen: BLOOD RIGHT WRIST  Result Value Ref Range Status   Specimen Description BLOOD RIGHT WRIST  Final   Special Requests   Final    BOTTLES DRAWN AEROBIC ONLY Blood Culture results may not be optimal due to an inadequate volume of blood received in culture bottles   Culture   Final    NO GROWTH 5 DAYS Performed at [facility], [address], [place], [place] [address]    Report Status  03/26/2023 FINAL  Final  Blood culture (routine x 2)     Status: None   Collection Time: 03/21/23  9:13 PM   Specimen: BLOOD  Result Value Ref Range Status   Specimen Description BLOOD RIGHT ANTECUBITAL  Final   Special Requests   Final    BOTTLES DRAWN AEROBIC AND ANAEROBIC Blood Culture results may not be optimal due to an inadequate volume of blood received in culture bottles   Culture   Final    NO GROWTH 5 DAYS Performed at [facility], [address], [place], [place] [address]    Report Status 03/26/2023 FINAL  Final    Antimicrobials/Microbiology: Anti-infectives (From admission, onward)    Start     Dose/Rate Route Frequency Ordered Stop   03/24/23 2200  cefUROXime (CEFTIN) tablet 500 mg        500 mg Oral 2 times daily with meals 03/24/23 1350 03/28/23 0729   03/22/23 2200  cefTRIAXone (ROCEPHIN) 2 g in sodium chloride 0.9 % 100 mL IVPB  Status:  Discontinued        2 g 200 mL/hr over 30 Minutes Intravenous Every 24 hours 03/22/23 0507 03/24/23 1350   03/22/23 2200  azithromycin (ZITHROMAX) 500 mg in sodium chloride 0.9 % 250 mL IVPB  Status:  Discontinued        500 mg 250 mL/hr over 60 Minutes Intravenous Every 24 hours 03/22/23 0507 03/22/23 1611   03/22/23 2200  azithromycin (ZITHROMAX) tablet 500 mg        500 mg Oral Daily at bedtime 03/22/23 1611 03/25/23 2057   03/21/23 1930  cefTRIAXone (ROCEPHIN) 2 g in sodium chloride 0.9 % 100 mL IVPB        2 g 200 mL/hr over 30 Minutes Intravenous  Once 03/21/23 1916 03/21/23 2150   03/21/23 1930  azithromycin (ZITHROMAX) 500 mg in sodium chloride 0.9 % 250 mL IVPB        500 mg 250 mL/hr over 60 Minutes Intravenous  Once 03/21/23 1916 03/21/23 2339  Component Value Date/Time   SDES BLOOD RIGHT ANTECUBITAL 03/21/2023 2113   SPECREQUEST  03/21/2023 2113    BOTTLES DRAWN AEROBIC AND ANAEROBIC Blood Culture results may not be optimal due to an inadequate volume of blood received in culture bottles   CULT   03/21/2023 2113    NO GROWTH 5 DAYS Performed at [facility], [address], [place], [place] [address]    REPTSTATUS 03/26/2023 FINAL 03/21/2023 2113     Radiology Studies: No results found.    LOS: 5 days   Total time spent in review of labs and imaging, patient evaluation, formulation of plan, documentation and communication with family: 35 minutes  Lanae Boast, MD  [facility]  03/26/2023, 8:56 AM

## 2023-03-27 ENCOUNTER — Telehealth: Payer: Self-pay | Admitting: Internal Medicine

## 2023-03-27 ENCOUNTER — Other Ambulatory Visit: Payer: Self-pay

## 2023-03-27 ENCOUNTER — Inpatient Hospital Stay (HOSPITAL_COMMUNITY)

## 2023-03-27 ENCOUNTER — Other Ambulatory Visit (HOSPITAL_COMMUNITY): Payer: Self-pay

## 2023-03-27 ENCOUNTER — Telehealth (HOSPITAL_COMMUNITY): Payer: Self-pay | Admitting: Pharmacy Technician

## 2023-03-27 DIAGNOSIS — R918 Other nonspecific abnormal finding of lung field: Secondary | ICD-10-CM | POA: Diagnosis not present

## 2023-03-27 LAB — BASIC METABOLIC PANEL
Anion gap: 11 (ref 5–15)
BUN: 7 mg/dL — ABNORMAL LOW (ref 8–23)
CO2: 25 mmol/L (ref 22–32)
Calcium: 8.5 mg/dL — ABNORMAL LOW (ref 8.9–10.3)
Chloride: 105 mmol/L (ref 98–111)
Creatinine, Ser: 0.8 mg/dL (ref 0.44–1.00)
GFR, Estimated: >60 mL/min (ref >60)
Glucose, Bld: 64 mg/dL — ABNORMAL LOW (ref 70–99)
Potassium: 3.9 mmol/L (ref 3.5–5.1)
Sodium: 141 mmol/L (ref 135–145)

## 2023-03-27 LAB — CBC
HCT: 40 % (ref 36.0–46.0)
Hemoglobin: 12.7 g/dL (ref 12.0–15.0)
MCH: 27.3 pg (ref 26.0–34.0)
MCHC: 31.8 g/dL (ref 30.0–36.0)
MCV: 86 fL (ref 80.0–100.0)
Platelets: 337 10*3/uL (ref 150–400)
RBC: 4.65 MIL/uL (ref 3.87–5.11)
RDW: 15.1 % (ref 11.5–15.5)
WBC: 8.6 10*3/uL (ref 4.0–10.5)
nRBC: 0 % (ref 0.0–0.2)

## 2023-03-27 LAB — PROTIME-INR
INR: 1.2 (ref 0.8–1.2)
Prothrombin Time: 15.2 s (ref 11.4–15.2)

## 2023-03-27 MED ORDER — ISOSORBIDE MONONITRATE ER 30 MG PO TB24
15.0000 mg | ORAL_TABLET | Freq: Every day | ORAL | 0 refills | Status: DC
  Filled 2023-03-27: qty 30, 60d supply, fill #0

## 2023-03-27 MED ORDER — FENTANYL CITRATE (PF) 100 MCG/2ML IJ SOLN
INTRAMUSCULAR | Status: AC
Start: 2023-03-27 — End: ?
  Filled 2023-03-27: qty 4

## 2023-03-27 MED ORDER — TRAMADOL HCL 50 MG PO TABS
50.0000 mg | ORAL_TABLET | Freq: Four times a day (QID) | ORAL | 0 refills | Status: DC | PRN
  Filled 2023-03-27: qty 15, 4d supply, fill #0

## 2023-03-27 MED ORDER — FENTANYL CITRATE (PF) 100 MCG/2ML IJ SOLN
INTRAMUSCULAR | Status: AC | PRN
  Administered 2023-03-27 (×3): 25 ug via INTRAVENOUS

## 2023-03-27 MED ORDER — MIDAZOLAM HCL 2 MG/2ML IJ SOLN
INTRAMUSCULAR | Status: AC
Start: 2023-03-27 — End: ?
  Filled 2023-03-27: qty 4

## 2023-03-27 MED ORDER — LEVOTHYROXINE SODIUM 100 MCG PO TABS
100.0000 ug | ORAL_TABLET | Freq: Every day | ORAL | 0 refills | Status: DC
Start: 2023-03-28 — End: 2023-07-06
  Filled 2023-03-27: qty 30, 30d supply, fill #0

## 2023-03-27 MED ORDER — MIDAZOLAM HCL 2 MG/2ML IJ SOLN
INTRAMUSCULAR | Status: AC | PRN
  Administered 2023-03-27 (×4): .5 mg via INTRAVENOUS

## 2023-03-27 MED ORDER — LIDOCAINE HCL 1 % IJ SOLN
10.0000 mL | Freq: Once | INTRAMUSCULAR | Status: AC
  Administered 2023-03-27: 10 mL via INTRADERMAL
  Filled 2023-03-27: qty 10

## 2023-03-27 NOTE — Plan of Care (Signed)

## 2023-03-27 NOTE — Procedures (Signed)
 Vascular and Interventional Radiology Procedure Note  Patient: [name] DOB: [birth year] Medical Record Number: [id number] Note Date/Time: 03/27/23 9:09 AM   Performing Physician: Roanna Banning, MD Assistant(s): None  Diagnosis: R lung, pleural based mass. No DX   Procedure: RIGHT LUNG MASS BIOPSY  Anesthesia: Conscious Sedation Complications: None Estimated Blood Loss: Minimal Specimens: Sent for Gram Stain, AFB, and Pathology  Findings:  Successful CT-guided biopsy of R lung, pleural based mass.. A total of 3 samples were obtained. Hemostasis of the tract was achieved using Hemostatic Patch.  Plan: Bed rest for 1 hours.  See detailed procedure note with images in PACS. The patient tolerated the procedure well without incident or complication and was returned to Recovery in stable condition.    Roanna Banning, MD [facility]   Pager. [phone] Clinic. [phone]

## 2023-03-27 NOTE — Progress Notes (Signed)
 Discharge instructions (including medications) discussed with and copy provided to patient/caregiver  Patient is waiting in her room for her ride that wont be here until after [facility]

## 2023-03-27 NOTE — Discharge Summary (Signed)
 Physician Discharge Summary  [name] NFA:[id number] DOB: 07/26/1945 DOA: 03/21/2023  PCP: Corwin Levins, MD  Admit date: 03/21/2023 Discharge date: 03/27/2023 Recommendations for Outpatient Follow-up:  Follow up with PCP in 1 weeks-call for appointment Fu with pulmonay for biopsy results Please obtain BMP/CBC in one week  Discharge Dispo: Home Discharge Condition: Stable Code Status:   Code Status: Full Code Diet recommendation:  Diet Order             Diet regular Room service appropriate? Yes; Fluid consistency: Thin  Diet effective ____                    Brief/Interim Summary: 78 yof w/ hx of CAD s/p multiple PCI's, diastolic CHF, diabetes mellitus presently not on meds, hypothyroidism presented to the ED with complaint of chest pain on and off x 3 wks, and found to have  RUL lung lesion in outpatient CT chest and concerning for necrotizing neoplastic lesion versus pneumonia.Patient states she also had a fall and hit her right side of the chest following which she had more chest pain.  In the ED vitals stable with labs unremarkable troponin negative x 2 lactic acid normal and patient was admitted for further management of chest pain and lung mass workup. Seen by cardiology> echo obtained stable unremarkable and not pursuing cardiac cath at this time. Seen by pulmonary> and referred to IR for lung biopsy.  Patient is having some solid sternal chest pain 3/9 EKG and troponin unremarkable, seen by cardiology again started on Imdur 15 mg not pursuing ischemic workup due to lack of objective evidence and also has been off aspirin for lung biopsy 3/10 which was completed successfully.  Patient will need to follow-up outpatient for biopsy results w/ PCCM.  Procedure: CT Chest 3/3> 8.2 x 2.6 x 7.1 cm irregular, pleural-based mass laterally in the right upper chest with a minimal air or gas-filled cavitary component and predominantly low-density centrally, possibly necrotic or filled  with fluid-necrotic malignancy or infectious process with empyema.  Stable paratracheal/precarinal lymph nodes calcific aortic and coronary atherosclerosis.  Consultation: Cardiology PCCM IR     Discharge Diagnoses:  Principal Problem:   Lung mass Active Problems:   Hypothyroidism   Diabetes (HCC)   Mixed hyperlipidemia   Essential hypertension   Abdominal aortic aneurysm (AAA) without rupture   Coronary artery disease   Chronic heart failure with preserved ejection fraction (HFpEF) (HCC)   Right-sided chest pain   Hyperlipidemia with target low density lipoprotein (LDL) cholesterol less than 55 mg/dL   Hypokalemia   Abnormal chest CT   Other chest pain   Chest pain x 3 weeks RUL Lung mass Possible pneumonia: Significant mass noticed on outpatient CT chest: Differential includes lung mass/malignancy/infarct/pneumonia-but no classical infectious symptoms or signs and no PE.  Managing with empiric ceftriaxone/azithromycin> switched to p.o to complete course x 5 days . Appreciate pulmonary input>planning for CT-guided biopsy, awaiting aspirin washout and CT guided biopsy arranged for 3/10 and completed follow-up outpatient for biopsy result.  Continue pain control. Msg sent to Dr Katrinka Blazing from pccm to arrange outpatient follow up re biopsy result  Diastolic CHF: Compensated continue atenolol.  Compensated on admission. PTA on prn lasix.   CAD s/p stents Chest pain: Seen by cardiology, chest pain is reproducible and not consistent with cardiac etiology.50-55% no RWMA RV SF normal RV size normal. No plan for cardiac cath. She had worsening substernal chest pain 3/9 although tender on exam, does have  right-sided chest wall pain reproducible with palpation-again EKG in troponin unremarkable and seen by cardiology Imdur was added, she will continue same and follow-up with cardiology outpatient Resume home aspirin post op. Cont home atenolol, Lipitor  Hypothyroidism:  TSH low at 0.06 and  FT4 elevated 1.16 likely iatrogenic Synthroid dose decreased-rechekc tsh/ft4 in 3 wk  AAA hx:  Stable.  No abdominal pain currently.   Hypokalemia: Resolved.     Plan discussed with Daughter  Subjective: Aaox3 not I ndistress some chest pain at needle site  Discharge Exam: Vitals:   03/27/23 1140 03/27/23 1319  BP: (!) 103/55 114/70  Pulse:    Resp: 16   Temp:    SpO2: 100%    General: Pt is alert, awake, not in acute distress Cardiovascular: RRR, S1/S2 +, no rubs, no gallops Respiratory: CTA bilaterally, no wheezing, no rhonchi Abdominal: Soft, NT, ND, bowel sounds + Extremities: no edema, no cyanosis  Discharge Instructions  Discharge Instructions     Ambulatory referral to Pulmonology   Complete by: As directed    Reason for referral: Lung Mass/Lung Nodule   Discharge instructions   Complete by: As directed    Please call call MD or return to ER for similar or worsening recurring problem that brought you to hospital or if any fever,nausea/vomiting,abdominal pain, uncontrolled pain, chest pain,  shortness of breath or any other alarming symptoms.  Please follow-up your doctor as instructed in a week time and call the office for appointment.  Please avoid alcohol, smoking, or any other illicit substance and maintain healthy habits including taking your regular medications as prescribed.  You were cared for by a hospitalist during your hospital stay. If you have any questions about your discharge medications or the care you received while you were in the hospital after you are discharged, you can call the unit and ask to speak with the hospitalist on call if the hospitalist that took care of you is not available.  Once you are discharged, your primary care physician will handle any further medical issues. Please note that NO REFILLS for any discharge medications will be authorized once you are discharged, as it is imperative that you return to your primary care physician  (or establish a relationship with a primary care physician if you do not have one) for your aftercare needs so that they can reassess your need for medications and monitor your lab values   Increase activity slowly   Complete by: As directed    No wound care   Complete by: As directed       Allergies as of 03/27/2023       Reactions   Crestor [rosuvastatin Calcium] Other (See Comments)   Feeling poor   Prilosec [omeprazole] Other (See Comments)   Chest pain   Miconazole Nitrate Hives   REACTION: hives   Augmentin [amoxicillin-pot Clavulanate] Hives, Itching, Rash   Has patient had a PCN reaction causing immediate rash, facial/tongue/throat swelling, SOB or lightheadedness with hypotension:unsure Has patient had a PCN reaction causing severe rash involving mucus membranes or skin necrosis:unsure Has patient had a PCN reaction that required hospitalization:No Has patient had a PCN reaction occurring within the last 10 years:NO If all of the above answers are "NO", then may proceed with Cephalosporin use. Has patient had a PCN reaction causing immediate rash, facial/tongue/throat swelling, SOB or lightheadedness with hypotension:unsure Has patient had a PCN reaction causing severe rash involving mucus membranes or skin necrosis:unsure Has patient had a PCN reaction  that required hospitalization:No Has patient had a PCN reaction occurring within the last 10 years:NO If all of the above answers are "NO", then may proceed with Cephalosporin use.   Doxycycline Other (See Comments)   REACTION: gi upset        Medication List     STOP taking these medications    sulfamethoxazole-trimethoprim 800-160 MG tablet Commonly known as: BACTRIM DS       TAKE these medications    allopurinol 300 MG tablet Commonly known as: ZYLOPRIM Take 1 tablet (300 mg total) by mouth as needed (for Gout).   aspirin EC 81 MG tablet Take 81 mg by mouth daily.   atenolol 25 MG tablet Commonly known  as: TENORMIN TAKE 1 TABLET BY MOUTH EVERY DAY.   atorvastatin 40 MG tablet Commonly known as: LIPITOR Take 1 tablet (40 mg total) by mouth daily.   furosemide 40 MG tablet Commonly known as: LASIX Take 1 tablet (40 mg total) by mouth daily.   gabapentin 300 MG capsule Commonly known as: Neurontin Take 1-2 tab by mouth at bedtime for pain and sleep What changed:  how much to take how to take this when to take this reasons to take this   isosorbide mononitrate 30 MG 24 hr tablet Commonly known as: IMDUR Take 0.5 tablets (15 mg total) by mouth daily. Start taking on: March 28, 2023   levothyroxine 100 MCG tablet Commonly known as: SYNTHROID Take 1 tablet (100 mcg total) by mouth daily before breakfast. Start taking on: March 28, 2023 What changed:  medication strength how much to take   nitroGLYCERIN 0.4 MG SL tablet Commonly known as: NITROSTAT Place 1 tablet (0.4 mg total) under the tongue every 5 (five) minutes as needed for chest pain.   potassium chloride 10 MEQ tablet Commonly known as: KLOR-CON Take 1 tablet (10 mEq total) by mouth daily.   traMADol 50 MG tablet Commonly known as: Ultram Take 1 tablet (50 mg total) by mouth every 6 (six) hours as needed for up to 15 doses.   Triamcinolone Acetonide 32 MG Srer intra-articular injection Commonly known as: ZILRETTA 5 mL, intra-articular, bilat knees        Follow-up Information     Corwin Levins, MD Follow up in 1 week(s).   Specialties: Internal Medicine, Radiology Contact information: [address] [place] [address] [phone]                Allergies  Allergen Reactions   Crestor [Rosuvastatin Calcium] Other (See Comments)    Feeling poor   Prilosec [Omeprazole] Other (See Comments)    Chest pain   Miconazole Nitrate Hives    REACTION: hives   Augmentin [Amoxicillin-Pot Clavulanate] Hives, Itching and Rash    Has patient had a PCN reaction causing immediate rash,  facial/tongue/throat swelling, SOB or lightheadedness with hypotension:unsure Has patient had a PCN reaction causing severe rash involving mucus membranes or skin necrosis:unsure Has patient had a PCN reaction that required hospitalization:No Has patient had a PCN reaction occurring within the last 10 years:NO If all of the above answers are "NO", then may proceed with Cephalosporin use. Has patient had a PCN reaction causing immediate rash, facial/tongue/throat swelling, SOB or lightheadedness with hypotension:unsure Has patient had a PCN reaction causing severe rash involving mucus membranes or skin necrosis:unsure Has patient had a PCN reaction that required hospitalization:No Has patient had a PCN reaction occurring within the last 10 years:NO If all of the above answers are "  NO", then may proceed with Cephalosporin use.    Doxycycline Other (See Comments)    REACTION: gi upset    The results of significant diagnostics from this hospitalization (including imaging, microbiology, ancillary and laboratory) are listed below for reference.    Microbiology: Recent Results (from the past 240 hours)  Blood culture (routine x 2)     Status: None   Collection Time: 03/21/23  9:00 PM   Specimen: BLOOD RIGHT WRIST  Result Value Ref Range Status   Specimen Description BLOOD RIGHT WRIST  Final   Special Requests   Final    BOTTLES DRAWN AEROBIC ONLY Blood Culture results may not be optimal due to an inadequate volume of blood received in culture bottles   Culture   Final    NO GROWTH 5 DAYS Performed at [facility], [address], [place], [place] [address]    Report Status 03/26/2023 FINAL  Final  Blood culture (routine x 2)     Status: None   Collection Time: 03/21/23  9:13 PM   Specimen: BLOOD  Result Value Ref Range Status   Specimen Description BLOOD RIGHT ANTECUBITAL  Final   Special Requests   Final    BOTTLES DRAWN AEROBIC AND ANAEROBIC Blood Culture results may not be  optimal due to an inadequate volume of blood received in culture bottles   Culture   Final    NO GROWTH 5 DAYS Performed at [facility], [address], [place], [place] [address]    Report Status 03/26/2023 FINAL  Final    Procedures/Studies: DG Chest Port 1 View Result Date: 03/27/2023 CLINICAL DATA:  [id number] Post-operative state 252351 EXAM: PORTABLE CHEST 1 VIEW COMPARISON:  Chest XR, 03/21/2023. CT chest, 03/20/2023. IR CT, earlier same day. FINDINGS: Cardiac silhouette is within normal limits. Lungs are hypoinflated. The LEFT lung is clear. Similar degree of RIGHT upper chest no pleural thickening, with associated pulmonary opacity and post procedural change. No pleural effusion or pneumothorax. Multilevel thoracic spine fusion. No acute osseous abnormality. IMPRESSION: Post biopsy changes at the RIGHT upper chest.  No pneumothorax. Electronically Signed   By: Roanna Banning M.D.   On: 03/27/2023 11:32   CT LUNG MASS BIOPSY Result Date: 03/27/2023 INDICATION: RIGHT pleural lung.  No diagnosis. EXAM: CT-GUIDED RIGHT PLEURAL MASS BIOPSY COMPARISON:  CT CHEST, 03/20/2023 MEDICATIONS: None. ANESTHESIA/SEDATION: Moderate (conscious) sedation was employed during this procedure. A total of Versed 2 mg and Fentanyl 75 mcg was administered intravenously. Moderate Sedation Time: 23 minutes. The patient's level of consciousness and vital signs were monitored continuously by radiology nursing throughout the procedure under my direct supervision. CONTRAST:  None FLUOROSCOPY TIME:  CT dose; 536 mGycm COMPLICATIONS: None immediate. PROCEDURE: RADIATION DOSE REDUCTION: This exam was performed according to the departmental dose-optimization program which includes automated exposure control, adjustment of the mA and/or kV according to patient size and/or use of iterative reconstruction technique. Informed consent was obtained from the patient following an explanation of the procedure, risks, benefits and  alternatives. The patient understands,agrees and consents for the procedure. All questions were addressed. A time out was performed prior to the initiation of the procedure. The patient was positioned supine on the CT table and a limited chest CT was performed for procedural planning demonstrating RIGHT pleural mass. The operative site was prepped and draped in the usual sterile fashion. Under sterile conditions and local anesthesia, a 17 gauge coaxial needle was advanced into the peripheral aspect of the nodule. Positioning  was confirmed with intermittent CT fluoroscopy and followed by the acquisition of RIGHT pleural mass with an 18 gauge core needle biopsy device. The coaxial needle was removed following deployment of hemostatic patch and superficial hemostasis was achieved with manual compression. Limited post procedural chest CT was negative for pneumothorax or additional complication. A dressing was placed. The patient tolerated the procedure well without immediate postprocedural complication. The patient was escorted to have an upright chest radiograph. IMPRESSION: Successful CT guided core needle core biopsy of RIGHT pleural mass. Roanna Banning, MD [facility] Electronically Signed   By: Roanna Banning M.D.   On: 03/27/2023 10:59   ECHOCARDIOGRAM COMPLETE Result Date: 03/22/2023    ECHOCARDIOGRAM REPORT   Patient Name:   BRITANEE VANBLARCOM Date of Exam: 03/22/2023 Medical Rec #:  [id number]      Height:       66.0 in Accession #:    [id number]     Weight:       176.0 lb Date of Birth:  10-Nov-1945       BSA:          1.894 m Patient Age:    77 years       BP:           134/64 mmHg Patient Gender: F              HR:           68 bpm. Exam Location:  Inpatient Procedure: 2D Echo, Cardiac Doppler and Color Doppler (Both Spectral and Color            Flow Doppler were utilized during procedure). Indications:    Chest Pain  History:        Patient has prior history  of Echocardiogram examinations, most                 recent 02/03/2021. CAD; AAA.  Sonographer:    Amy Chionchio Referring Phys: Midge Minium, N IMPRESSIONS  1. Abnormal septal motion Strain and 3D EF not performed. Left ventricular ejection fraction, by estimation, is 50 to 55%. The left ventricle has low normal function. The left ventricle has no regional wall motion abnormalities. Left ventricular diastolic parameters were normal.  2. Right ventricular systolic function is normal. The right ventricular size is normal.  3. The mitral valve is abnormal. Trivial mitral valve regurgitation. No evidence of mitral stenosis.  4. The aortic valve is tricuspid. There is moderate calcification of the aortic valve. There is moderate thickening of the aortic valve. Aortic valve regurgitation is not visualized. Aortic valve sclerosis/calcification is present, without any evidence of aortic stenosis.  5. The inferior vena cava is normal in size with greater than 50% respiratory variability, suggesting right atrial pressure of 3 mmHg. FINDINGS  Left Ventricle: Abnormal septal motion Strain and 3D EF not performed. Left ventricular ejection fraction, by estimation, is 50 to 55%. The left ventricle has low normal function. The left ventricle has no regional wall motion abnormalities. Strain was performed and the global longitudinal strain is indeterminate. The left ventricular internal cavity size was normal in size. There is no left ventricular hypertrophy. Left ventricular diastolic parameters were normal. Right Ventricle: The right ventricular size is normal. No increase in right ventricular wall thickness. Right ventricular systolic function is normal. Left Atrium: Left atrial size was normal in size. Right Atrium: Right atrial size was normal in size. Pericardium: There is no evidence of pericardial effusion. Mitral Valve:  The mitral valve is abnormal. There is mild thickening of the mitral valve leaflet(s). There is  mild calcification of the mitral valve leaflet(s). Mild mitral annular calcification. Trivial mitral valve regurgitation. No evidence of mitral valve stenosis. MV peak gradient, 5.3 mmHg. The mean mitral valve gradient is 2.0 mmHg. Tricuspid Valve: The tricuspid valve is normal in structure. Tricuspid valve regurgitation is mild . No evidence of tricuspid stenosis. Aortic Valve: The aortic valve is tricuspid. There is moderate calcification of the aortic valve. There is moderate thickening of the aortic valve. Aortic valve regurgitation is not visualized. Aortic valve sclerosis/calcification is present, without any  evidence of aortic stenosis. Aortic valve mean gradient measures 4.0 mmHg. Aortic valve peak gradient measures 6.7 mmHg. Aortic valve area, by VTI measures 2.31 cm. Pulmonic Valve: The pulmonic valve was normal in structure. Pulmonic valve regurgitation is not visualized. No evidence of pulmonic stenosis. Aorta: The aortic root is normal in size and structure. Venous: The inferior vena cava is normal in size with greater than 50% respiratory variability, suggesting right atrial pressure of 3 mmHg. IAS/Shunts: No atrial level shunt detected by color flow Doppler. Additional Comments: 3D was performed not requiring image post processing on an independent workstation and was indeterminate.  LEFT VENTRICLE PLAX 2D LVIDd:         4.90 cm      Diastology LVIDs:         3.10 cm      LV e' medial:    4.57 cm/s LV PW:         0.80 cm      LV E/e' medial:  17.2 LV IVS:        0.80 cm      LV e' lateral:   9.36 cm/s LVOT diam:     2.00 cm      LV E/e' lateral: 8.4 LV SV:         60 LV SV Index:   32 LVOT Area:     3.14 cm  LV Volumes (MOD) LV vol d, MOD A2C: 105.0 ml LV vol d, MOD A4C: 91.8 ml LV vol s, MOD A4C: 42.4 ml LV SV MOD A4C:     91.8 ml RIGHT VENTRICLE            IVC RV Basal diam:  3.50 cm    IVC diam: 1.40 cm RV S prime:     8.05 cm/s TAPSE (M-mode): 1.5 cm LEFT ATRIUM             Index        RIGHT  ATRIUM           Index LA Vol (A2C):   54.8 ml 28.93 ml/m  RA Area:     12.70 cm LA Vol (A4C):   67.0 ml 35.37 ml/m  RA Volume:   30.40 ml  16.05 ml/m LA Biplane Vol: 61.6 ml 32.52 ml/m  AORTIC VALVE                    PULMONIC VALVE AV Area (Vmax):    2.31 cm     PV Vmax:       0.62 m/s AV Area (Vmean):   2.36 cm     PV Peak grad:  1.5 mmHg AV Area (VTI):     2.31 cm AV Vmax:           129.00 cm/s AV Vmean:          88.000 cm/s  AV VTI:            0.260 m AV Peak Grad:      6.7 mmHg AV Mean Grad:      4.0 mmHg LVOT Vmax:         95.00 cm/s LVOT Vmean:        66.100 cm/s LVOT VTI:          0.191 m LVOT/AV VTI ratio: 0.73  AORTA Ao Root diam: 2.60 cm Ao Asc diam:  2.70 cm MITRAL VALVE MV Area (PHT): 3.10 cm    SHUNTS MV Area VTI:   1.92 cm    Systemic VTI:  0.19 m MV Peak grad:  5.3 mmHg    Systemic Diam: 2.00 cm MV Mean grad:  2.0 mmHg MV Vmax:       1.15 m/s MV Vmean:      69.5 cm/s MV Decel Time: 245 msec MV E velocity: 78.60 cm/s MV A velocity: 89.60 cm/s MV E/A ratio:  0.88 Charlton Haws MD Electronically signed by Charlton Haws MD Signature Date/Time: 03/22/2023/4:35:38 PM    Final    DG Chest 2 View Result Date: 03/21/2023 CLINICAL DATA:  Intermittent chest pain EXAM: CHEST - 2 VIEW COMPARISON:  CT chest dated 03/20/2023, chest radiograph dated 02/21/2023 FINDINGS: Patient is rotated to the right. Normal lung volumes. Superolateral right lung opacity, better evaluated on recent chest CT. Minimal adjacent interstitial opacities. No pleural effusion or pneumothorax. The heart size and mediastinal contours are within normal limits. No acute osseous abnormality. Partially imaged thoracolumbar spinal fixation hardware appears intact. Partially imaged aortic stent graft. IMPRESSION: Superolateral right lung opacity, better evaluated on recent chest CT. No new focal consolidations. Electronically Signed   By: Agustin Cree M.D.   On: 03/21/2023 19:57   CT Chest W Contrast Result Date: 03/20/2023 CLINICAL DATA:   Peripheral right upper lobe opacity which may be cavitary seen on chest radiographs dated 02/21/2023. Chest CT was recommended for further evaluation. EXAM: CT CHEST WITH CONTRAST TECHNIQUE: Multidetector CT imaging of the chest was performed during intravenous contrast administration. RADIATION DOSE REDUCTION: This exam was performed according to the departmental dose-optimization program which includes automated exposure control, adjustment of the mA and/or kV according to patient size and/or use of iterative reconstruction technique. CONTRAST:  75mL ISOVUE-300 IOPAMIDOL (ISOVUE-300) INJECTION 61% COMPARISON:  Chest radiographs dated 02/21/2023 and 04/03/2019. Chest CTA dated 06/11/2017. FINDINGS: Cardiovascular: Atheromatous calcifications, including the coronary arteries and aorta. Normal-sized heart. No pericardial effusion. Mediastinum/Nodes: Surgically absent left lobe thyroid and isthmus. Normal-appearing right lobe. Mildly enlarged right anterior paratracheal/precarinal node with a short axis diameter of 14 mm on image number 135/10 without significant change since 06/11/2017. No interval enlarged lymph nodes elsewhere. Unremarkable trachea and esophagus. Lungs/Pleura: Irregular, pleural-based mass laterally in the right upper chest. This contains a minimal air or gas-filled cavitary component and is predominantly low-density centrally, possibly necrotic or filled with fluid. This is difficult to assess due to extensive streak artifact from spinal hardware. This area measures 8.2 x 2.6 cm on image number 89/10. This measures 7.1 cm in length on coronal image number 97/4. No pleural fluid elsewhere on either side. There are patchy, linear and interstitial densities in the right upper lobe adjacent to and inferior to the pleural-based mass. Clear left lung.  No lung nodules seen. Upper Abdomen: Extensive streak artifacts from spine fixation hardware. Multiple small colonic diverticula. Oval area of low  density in the left lobe of the liver measuring 4.2 x  3.3 cm on image number 297/10. This measures 19 Hounsfield units in density, including increased density associated with streak artifacts. This measured 2.7 x 2.1 cm and -4 Hounsfield units in density on 02/24/2017, compatible with a simple cyst, not requiring imaging follow-up. Musculoskeletal: Pedicle screw and rod fixation throughout the thoracic and lumbar spine from the upper thoracic spine down with associated extensive streak artifacts. Extensive thoracic and lower cervical spine degenerative changes. Grade 1 anterolisthesis in the lower thoracic spine with pedicle screw tracts compatible with removed screws. IMPRESSION: 1. 8.2 x 2.6 x 7.1 cm irregular, pleural-based mass laterally in the right upper chest with a minimal air or gas-filled cavitary component and predominantly low-density centrally, possibly necrotic or filled with fluid. This is difficult to assess due to extensive streak artifacts from spinal hardware. This could represent a necrotic malignancy or an infectious process with a empyema. 2. Patchy, linear and interstitial densities in the right upper lobe adjacent to and inferior to the pleural-based mass, likely due to infection. This is more extensive and pronounced than expected with lymphangitic spread of tumor. 3. Stable mildly enlarged right anterior paratracheal/precarinal lymph node since 06/11/2017, compatible with a benign lymph node. 4.  Calcific coronary artery and aortic atherosclerosis. 5. Colonic diverticulosis. Aortic Atherosclerosis (ICD10-I70.0). Electronically Signed   By: Beckie Salts M.D.   On: 03/20/2023 17:37   MM 3D SCREENING MAMMOGRAM BILATERAL BREAST Result Date: 03/17/2023 CLINICAL DATA:  Screening. EXAM: DIGITAL SCREENING BILATERAL MAMMOGRAM WITH TOMOSYNTHESIS AND CAD TECHNIQUE: Bilateral screening digital craniocaudal and mediolateral oblique mammograms were obtained. Bilateral screening digital breast  tomosynthesis was performed. The images were evaluated with computer-aided detection. COMPARISON:  Previous exam(s). ACR Breast Density Category a: The breasts are almost entirely fatty. FINDINGS: There are no findings suspicious for malignancy. IMPRESSION: No mammographic evidence of malignancy. A result letter of this screening mammogram will be mailed directly to the patient. RECOMMENDATION: Screening mammogram in one year. (Code:[id number]-B-01Y) BI-RADS CATEGORY  1: Negative. Electronically Signed   By: Frederico Hamman M.D.   On: 03/17/2023 15:17    Labs: BNP (last 3 results) No results for input(s): "BNP" in the last 8760 hours. Basic Metabolic Panel: Recent Labs  Lab 03/22/23 0642 03/23/23 0743 03/24/23 0747 03/25/23 0732 03/26/23 0804 03/27/23 0741  NA 139 137 137 141 140 141  K 3.0* 3.6 3.8 3.8 3.9 3.9  CL 105 103 103 105 104 105  CO2 28 26 27 29 26 25   GLUCOSE 124* 87 81 86 72 64*  BUN <5* 5* 6* 7* 6* 7*  CREATININE 0.79 0.84 0.60 0.77 0.63 0.80  CALCIUM 8.1* 8.0* 7.7* 8.2* 8.4* 8.5*  MG 1.7 2.0  --   --   --   --    Liver Function Tests: Recent Labs  Lab 03/22/23 0642  AST 15  ALT 9  ALKPHOS 91  BILITOT 0.6  PROT 6.3*  ALBUMIN 2.4*   No results for input(s): "LIPASE", "AMYLASE" in the last 168 hours. No results for input(s): "AMMONIA" in the last 168 hours. CBC: Recent Labs  Lab 03/22/23 0642 03/23/23 0743 03/24/23 0747 03/25/23 0732 03/26/23 0804 03/27/23 0741  WBC 9.0 9.6 8.0 8.8 8.2 8.6  NEUTROABS 5.8  --   --   --   --   --   HGB 12.1 12.2 12.4 12.3 12.8 12.7  HCT 38.5 38.4 39.2 38.6 40.1 40.0  MCV 87.9 85.7 86.5 85.8 86.1 86.0  PLT 284 290 317 308 316 337   Cardiac Enzymes: No  results for input(s): "CKTOTAL", "CKMB", "CKMBINDEX", "TROPONINI" in the last 168 hours. BNP: Invalid input(s): "POCBNP" CBG: No results for input(s): "GLUCAP" in the last 168 hours. D-Dimer No results for input(s): "DDIMER" in the last 72 hours. Hgb A1c No results for  input(s): "HGBA1C" in the last 72 hours. Lipid Profile No results for input(s): "CHOL", "HDL", "LDLCALC", "TRIG", "CHOLHDL", "LDLDIRECT" in the last 72 hours. Thyroid function studies No results for input(s): "TSH", "T4TOTAL", "T3FREE", "THYROIDAB" in the last 72 hours.  Invalid input(s): "FREET3" Anemia work up No results for input(s): "VITAMINB12", "FOLATE", "FERRITIN", "TIBC", "IRON", "RETICCTPCT" in the last 72 hours. Urinalysis    Component Value Date/Time   COLORURINE YELLOW 02/21/2023 1424   APPEARANCEUR CLEAR 02/21/2023 1424   LABSPEC <=1.005 (A) 02/21/2023 1424   PHURINE 6.0 02/21/2023 1424   GLUCOSEU NEGATIVE 02/21/2023 1424   HGBUR NEGATIVE 02/21/2023 1424   HGBUR negative 06/22/2007 1400   BILIRUBINUR NEGATIVE 02/21/2023 1424   BILIRUBINUR negative 01/16/2018 0939   KETONESUR NEGATIVE 02/21/2023 1424   PROTEINUR Negative 01/16/2018 0939   PROTEINUR NEGATIVE 06/11/2017 0345   UROBILINOGEN 0.2 02/21/2023 1424   NITRITE NEGATIVE 02/21/2023 1424   LEUKOCYTESUR NEGATIVE 02/21/2023 1424   Sepsis Labs Recent Labs  Lab 03/24/23 0747 03/25/23 0732 03/26/23 0804 03/27/23 0741  WBC 8.0 8.8 8.2 8.6   Microbiology Recent Results (from the past 240 hours)  Blood culture (routine x 2)     Status: None   Collection Time: 03/21/23  9:00 PM   Specimen: BLOOD RIGHT WRIST  Result Value Ref Range Status   Specimen Description BLOOD RIGHT WRIST  Final   Special Requests   Final    BOTTLES DRAWN AEROBIC ONLY Blood Culture results may not be optimal due to an inadequate volume of blood received in culture bottles   Culture   Final    NO GROWTH 5 DAYS Performed at [facility], [address], [place], [place] [address]    Report Status 03/26/2023 FINAL  Final  Blood culture (routine x 2)     Status: None   Collection Time: 03/21/23  9:13 PM   Specimen: BLOOD  Result Value Ref Range Status   Specimen Description BLOOD RIGHT ANTECUBITAL  Final   Special Requests    Final    BOTTLES DRAWN AEROBIC AND ANAEROBIC Blood Culture results may not be optimal due to an inadequate volume of blood received in culture bottles   Culture   Final    NO GROWTH 5 DAYS Performed at [facility], [address], [place], [place] [address]    Report Status 03/26/2023 FINAL  Final     Time coordinating discharge: 25 minutes  SIGNED: Lanae Boast, MD  [facility] 03/27/2023, 2:13 PM  If 7PM-7AM, please contact night-coverage [email or web address]

## 2023-03-27 NOTE — Telephone Encounter (Signed)
 LM for Pt to call for appt w/I two weeks w/Ms. Groce or Dr. Delton Coombes (Can be virtual)

## 2023-03-27 NOTE — Telephone Encounter (Signed)
 Patient Product/process development scientist completed.    The patient is insured through [employer]. Patient has Medicare and is not eligible for a copay card, but may be able to apply for patient assistance or Medicare RX Payment Plan (Patient Must reach out to their plan, if eligible for payment plan), if available.    Ran test claim for Farixga 10 mg and the current 30 day co-pay is $395.78 due to a deductible.  Ran test claim for Jardiance 10 mg and the current 30 day co-pay is $330.61 due to a deductible.  This test claim was processed through [facility]- copay amounts may vary at other pharmacies due to pharmacy/plan contracts, or as the patient moves through the different stages of their insurance plan.     [name], CPHT Pharmacy Technician III Certified Patient Advocate [facility] Patient Advocate Team Direct Number: [phone]  Fax: [phone]

## 2023-03-27 NOTE — Telephone Encounter (Signed)
 2 week appt to review path results from pleural biopsy (virtual or in person)

## 2023-03-29 LAB — ACID FAST SMEAR (AFB, MYCOBACTERIA): Acid Fast Smear: NEGATIVE

## 2023-03-29 NOTE — Telephone Encounter (Signed)
 Copied from CRM [id number]. Topic: Clinical - Lab/Test Results >> Mar 29, 2023  3:08 PM Chantha C wrote: Reason for CRM: Patient is returning the office call on lung test results, no names was given. Please advise and call back [phone]

## 2023-03-30 ENCOUNTER — Telehealth: Payer: Self-pay | Admitting: Internal Medicine

## 2023-03-30 ENCOUNTER — Telehealth: Payer: Self-pay | Admitting: Emergency Medicine

## 2023-03-30 DIAGNOSIS — R918 Other nonspecific abnormal finding of lung field: Secondary | ICD-10-CM

## 2023-03-30 LAB — SURGICAL PATHOLOGY

## 2023-03-30 MED ORDER — CVS GEL HEEL CUSHION WOMENS PADS
MEDICATED_PAD | 5 refills | Status: DC
Start: 2023-03-30 — End: 2023-10-19

## 2023-03-30 MED ORDER — FLUCONAZOLE 150 MG PO TABS: ORAL_TABLET | ORAL | 1 refills | Status: DC

## 2023-03-30 MED ORDER — SULFAMETHOXAZOLE-TRIMETHOPRIM 800-160 MG PO TABS
1.0000 | ORAL_TABLET | Freq: Two times a day (BID) | ORAL | 0 refills | Status: DC
Start: 2023-03-30 — End: 2023-04-10

## 2023-03-30 NOTE — Addendum Note (Signed)
 Addended by: Corwin Levins on: 03/30/2023 03:34 PM   Modules accepted: Orders

## 2023-03-30 NOTE — Telephone Encounter (Signed)
 Ok I sent the antibiotic  Please also see the mar 10 pulm note to her - and ask her to call the number to get an appt asap

## 2023-03-30 NOTE — Telephone Encounter (Signed)
 Returned Pt call and went over results.

## 2023-03-30 NOTE — Telephone Encounter (Signed)
 Copied from CRM [id number]. Topic: Clinical - Lab/Test Results >> Mar 29, 2023  5:43 PM Taleah C wrote: Reason for CRM: pt called & asked if someone could give her a callback to discuss her results when ready. Please advise.

## 2023-03-30 NOTE — Addendum Note (Signed)
 Addended by: Corwin Levins on: 03/30/2023 04:09 PM   Modules accepted: Orders

## 2023-03-30 NOTE — Telephone Encounter (Signed)
 Called and let Pt know

## 2023-03-30 NOTE — Telephone Encounter (Signed)
 I was notified by pathology that patient's transthoracic needle biopsy showed squamous cell lung cancer.  Called the patient to discuss.  No answer so left him a voicemail that we will try him back.

## 2023-03-30 NOTE — Telephone Encounter (Signed)
 Ok yeast med done erx  Also I sent rx for Heel Cushions, but she may be able to get this OTC as well.    thanks

## 2023-04-01 LAB — AEROBIC/ANAEROBIC CULTURE W GRAM STAIN (SURGICAL/DEEP WOUND): Gram Stain: NONE SEEN

## 2023-04-03 NOTE — Telephone Encounter (Signed)
 I reviewed the biopsy results with the patient by phone and also her daughter by phone.  Squamous cell lung cancer present.  She is having significant pleural pain.  Needs referral ASAP.  I will send a referral to the cancer center now.  We will cancel her appointment with RB 7/21.  Please cancel new consult visit 04/07/2023.

## 2023-04-03 NOTE — Telephone Encounter (Signed)
 Spoke w/ Bassy, canceled ov. Pt verbalized understanding. NFN.

## 2023-04-06 ENCOUNTER — Telehealth: Payer: Self-pay | Admitting: Internal Medicine

## 2023-04-06 ENCOUNTER — Other Ambulatory Visit: Payer: Self-pay

## 2023-04-06 NOTE — Progress Notes (Signed)
 The proposed treatment discussed in conference is for discussion purpose only and is not a binding recommendation.  The patients have not been physically examined, or presented with their treatment options.  Therefore, final treatment plans cannot be decided.

## 2023-04-07 ENCOUNTER — Institutional Professional Consult (permissible substitution): Admitting: Emergency Medicine

## 2023-04-07 ENCOUNTER — Other Ambulatory Visit: Payer: Self-pay | Admitting: Medical Oncology

## 2023-04-07 DIAGNOSIS — C3490 Malignant neoplasm of unspecified part of unspecified bronchus or lung: Secondary | ICD-10-CM

## 2023-04-07 NOTE — Progress Notes (Signed)
 Reached out to pt to introduce myself and confirm her appt with Dr Arbutus Ped on 3/24. Pt did not answer mobile. Msg left with my direct number requesting a call back. I called the pt's dtr, Tramaine, who states that she has already spoken to scheduler, Tameka, confirming the appt. I introduced myself as the pt's nurse navigator and explained my role. I confirmed that Leonard Schwartz will be attending the appt with the pt and she knows the location of the cancer center. She is aware that the pt has labs at 11:15 and that she does not have to fast for them.

## 2023-04-10 ENCOUNTER — Inpatient Hospital Stay

## 2023-04-10 ENCOUNTER — Encounter: Payer: Self-pay | Admitting: Internal Medicine

## 2023-04-10 ENCOUNTER — Other Ambulatory Visit: Payer: Self-pay | Admitting: Internal Medicine

## 2023-04-10 ENCOUNTER — Inpatient Hospital Stay: Attending: Internal Medicine | Admitting: Internal Medicine

## 2023-04-10 ENCOUNTER — Ambulatory Visit (INDEPENDENT_AMBULATORY_CARE_PROVIDER_SITE_OTHER): Admitting: Internal Medicine

## 2023-04-10 VITALS — BP 122/66 | HR 75 | Temp 98.4°F | Ht 66.0 in | Wt 163.0 lb

## 2023-04-10 VITALS — BP 111/82 | HR 69 | Temp 98.2°F | Resp 17 | Wt 162.4 lb

## 2023-04-10 DIAGNOSIS — H6691 Otitis media, unspecified, right ear: Secondary | ICD-10-CM

## 2023-04-10 DIAGNOSIS — C3411 Malignant neoplasm of upper lobe, right bronchus or lung: Secondary | ICD-10-CM | POA: Insufficient documentation

## 2023-04-10 DIAGNOSIS — R079 Chest pain, unspecified: Secondary | ICD-10-CM | POA: Diagnosis not present

## 2023-04-10 DIAGNOSIS — F5101 Primary insomnia: Secondary | ICD-10-CM

## 2023-04-10 DIAGNOSIS — Z87891 Personal history of nicotine dependence: Secondary | ICD-10-CM | POA: Insufficient documentation

## 2023-04-10 DIAGNOSIS — C3490 Malignant neoplasm of unspecified part of unspecified bronchus or lung: Secondary | ICD-10-CM

## 2023-04-10 DIAGNOSIS — K5900 Constipation, unspecified: Secondary | ICD-10-CM | POA: Diagnosis not present

## 2023-04-10 LAB — CMP (CANCER CENTER ONLY)
ALT: 5 U/L (ref 0–44)
AST: 13 U/L — ABNORMAL LOW (ref 15–41)
Albumin: 3.6 g/dL (ref 3.5–5.0)
Alkaline Phosphatase: 109 U/L (ref 38–126)
Anion gap: 8 (ref 5–15)
BUN: 7 mg/dL — ABNORMAL LOW (ref 8–23)
CO2: 29 mmol/L (ref 22–32)
Calcium: 9.1 mg/dL (ref 8.9–10.3)
Chloride: 99 mmol/L (ref 98–111)
Creatinine: 0.88 mg/dL (ref 0.44–1.00)
GFR, Estimated: >60 mL/min (ref >60)
Glucose, Bld: 100 mg/dL — ABNORMAL HIGH (ref 70–99)
Potassium: 4.2 mmol/L (ref 3.5–5.1)
Sodium: 136 mmol/L (ref 135–145)
Total Bilirubin: 0.6 mg/dL (ref 0.0–1.2)
Total Protein: 8.5 g/dL — ABNORMAL HIGH (ref 6.5–8.1)

## 2023-04-10 LAB — CBC WITH DIFFERENTIAL (CANCER CENTER ONLY)
Abs Immature Granulocytes: 0.07 10*3/uL (ref 0.00–0.07)
Basophils Absolute: 0 10*3/uL (ref 0.0–0.1)
Basophils Relative: 0 %
Eosinophils Absolute: 0.1 10*3/uL (ref 0.0–0.5)
Eosinophils Relative: 1 %
HCT: 43.4 % (ref 36.0–46.0)
Hemoglobin: 13.8 g/dL (ref 12.0–15.0)
Immature Granulocytes: 1 %
Lymphocytes Relative: 20 %
Lymphs Abs: 2 10*3/uL (ref 0.7–4.0)
MCH: 27 pg (ref 26.0–34.0)
MCHC: 31.8 g/dL (ref 30.0–36.0)
MCV: 84.8 fL (ref 80.0–100.0)
Monocytes Absolute: 0.9 10*3/uL (ref 0.1–1.0)
Monocytes Relative: 9 %
Neutro Abs: 6.8 10*3/uL (ref 1.7–7.7)
Neutrophils Relative %: 69 %
Platelet Count: 407 10*3/uL — ABNORMAL HIGH (ref 150–400)
RBC: 5.12 MIL/uL — ABNORMAL HIGH (ref 3.87–5.11)
RDW: 14.5 % (ref 11.5–15.5)
WBC Count: 9.9 10*3/uL (ref 4.0–10.5)
nRBC: 0 % (ref 0.0–0.2)

## 2023-04-10 MED ORDER — KETOROLAC TROMETHAMINE 30 MG/ML IJ SOLN
30.0000 mg | Freq: Once | INTRAMUSCULAR | Status: AC
  Administered 2023-04-10: 30 mg via INTRAMUSCULAR

## 2023-04-10 MED ORDER — HYDROCODONE-ACETAMINOPHEN 10-325 MG PO TABS: 1.0000 | ORAL_TABLET | Freq: Three times a day (TID) | ORAL | 0 refills | Status: DC | PRN

## 2023-04-10 MED ORDER — TEMAZEPAM 15 MG PO CAPS: 15.0000 mg | ORAL_CAPSULE | Freq: Every evening | ORAL | 2 refills | Status: DC | PRN

## 2023-04-10 MED ORDER — LEVOFLOXACIN 500 MG PO TABS: 500.0000 mg | ORAL_TABLET | Freq: Every day | ORAL | 0 refills | Status: DC

## 2023-04-10 NOTE — Assessment & Plan Note (Signed)
 Mild to mod, for antibx course levaquin 500 every day,  to f/u any worsening symptoms or concerns

## 2023-04-10 NOTE — Telephone Encounter (Signed)
 Copied from CRM [id number]. Topic: Clinical - Medication Refill >> Apr 10, 2023  2:20 PM Pascal Lux wrote: Most Recent Primary Care Visit:  Provider: Corwin Levins  Department: [facility]  Visit Type: OFFICE VISIT  Date: 02/21/2023  Medication: traMADol (ULTRAM) 50 MG tablet [[id number]]  Has the patient contacted their pharmacy? No (Agent: If no, request that the patient contact the pharmacy for the refill. If patient does not wish to contact the pharmacy document the reason why and proceed with request.) (Agent: If yes, when and what did the pharmacy advise?)  Is this the correct pharmacy for this prescription? Yes If no, delete pharmacy and type the correct one.  This is the patient's preferred pharmacy:  [id number] - [place], [place] - [address] AT [facility] OF [address] [place] [address] Phone: [phone] Fax: [phone]   Has the prescription been filled recently? Yes  Is the patient out of the medication? Yes  Has the patient been seen for an appointment in the last year OR does the patient have an upcoming appointment? Yes  Can we respond through MyChart? No  Agent: Please be advised that Rx refills may take up to 3 business days. We ask that you follow-up with your pharmacy.

## 2023-04-10 NOTE — Assessment & Plan Note (Addendum)
 C/w cancer pain, for hydrocodone 10- 325 prn,  to f/u any worsening symptoms or concerns, also toradol 30 mg IM

## 2023-04-10 NOTE — Progress Notes (Signed)
 Patient ID: [name], female   DOB: Oct 12, 1945, 78 y.o.   MRN: [id number]        Chief Complaint: follow up right ear pain, cancer pain, constipation, insomnia       HPI:  [name] is a 78 y.o. female here with c/o 3 days osnet severe right ear pain  with clogged feeling.  Pt denies chest pain, increased sob or doe, wheezing, orthopnea, PND, increased LE swelling, palpitations, dizziness or syncope, except for severe uncontrolled right side chest pain and known lung ca.  Has had mild constipation  Also worsening difficulty getting to sleep.          Wt Readings from Last 3 Encounters:  04/10/23 163 lb (73.9 kg)  04/10/23 162 lb 6.4 oz (73.7 kg)  03/21/23 176 lb (79.8 kg)   BP Readings from Last 3 Encounters:  04/10/23 122/66  04/10/23 111/82  03/27/23 106/64         Past Medical History:  Diagnosis Date   AAA (abdominal aortic aneurysm) (HCC)    a. s/p stent graft repair 01/2016.   Acute ischemic colitis (HCC) 07/29/2010   Diagnosed June, 2012 characterized by acute lower GI bleeding, spontaneously resolved.    Acute respiratory infection 06/09/2014   Acute sinus infection 05/14/2015   Allergic rhinitis, cause unspecified    Angioedema of lips 06/03/2014   Anxiety state, unspecified    Arthritis of left hip 04/16/2015   Bilateral hearing loss 07/19/2017   Chronic LBP    Coronary artery disease    a. inferior MI 1998 s/p PCI of RCA. b. stenting of Cx 04/2010. c. DES to prox LAD 05/2013. d. DES x 3 in 10/2015. // Myoview 07/2018:  EF 59, normal perfusion, low risk    Coronary artery disease involving native heart without angina pectoris 07/31/2006   Qualifier: Diagnosis of  By: Debby Bud MD, Rosalyn Gess  ANGIOGRAPHIC DATA:  1. Ventriculography done in the RAO projection reveals a small wall  motion abnormality in the mid inferior wall. Ejection fraction  would be estimated at around 50%.  2. The right coronary artery has some progressive disease of about 60-  80% in the proximal mid  segment. The distal vessel appears to be  50-70% narrowing althou   Degeneration of lumbar or lumbosacral intervertebral disc    Depressive disorder, not elsewhere classified    Diabetes (HCC) 11/03/2006   Qualifier: Diagnosis of  By: Jonny Ruiz MD, Len Blalock    Diabetes mellitus    TYPE II   Frequent urination 05/08/2014   Gout 08/22/2013   History of endovascular stent graft for abdominal aortic aneurysm (AAA) 01/22/2016   Hyperlipidemia    Hypertension    Hypothyroidism    Iron deficiency anemia 06/13/2017   Lower GI bleed 06/2010   Diverticular bleed   Lumbar radiculopathy 05/15/2015   MENOPAUSAL DISORDER 10/29/2007   Qualifier: Diagnosis of  By: Jonny Ruiz MD, Len Blalock    Myalgia 09/25/2013   Noncompliance with medications 02/26/2014   Obesity, unspecified    OTITIS MEDIA, SEROUS, CHRONIC 04/23/2007   Qualifier: Diagnosis of  By: Debby Bud MD, Rosalyn Gess    Thoracic disc disease 11/19/2018   URTICARIA 09/23/2009   Qualifier: Diagnosis of  By: Felicity Coyer MD, Valerie A    Venous insufficiency 07/19/2017   Past Surgical History:  Procedure Laterality Date   CARDIAC CATHETERIZATION     PCI OF BOTH THE CIRCUMFLEX AND LEFT ANTERIOR DESCENDING ARTERY   CARDIAC CATHETERIZATION N/A 10/29/2015  Procedure: Coronary Stent Intervention;  Surgeon: Yvonne Kendall, MD;  Location: [facility] INVASIVE CV LAB;  Service: Cardiovascular;  Laterality: N/A;   CARDIAC CATHETERIZATION N/A 10/29/2015   Procedure: Coronary/Graft Angiography;  Surgeon: Yvonne Kendall, MD;  Location: [facility] INVASIVE CV LAB;  Service: Cardiovascular;  Laterality: N/A;   CARDIAC CATHETERIZATION N/A 10/29/2015   Procedure: Intravascular Pressure Wire/FFR Study;  Surgeon: Yvonne Kendall, MD;  Location: [facility]AS[facility] CV LAB;  Service: Cardiovascular;  Laterality: N/A;   CESAREAN SECTION     ENDOVASCULAR STENT INSERTION N/A 01/22/2016   Procedure: ABDOMINAL AORTIC ENDOVASCULAR STENT GRAFT INSERTION;  Surgeon: Larina Earthly, MD;  Location: [facility] OR;  Service: Vascular;   Laterality: N/A;   HEART STENT  04-2010  and  Jun 07, 2013   X 3   LEFT HEART CATH AND CORONARY ANGIOGRAPHY N/A 04/03/2019   Procedure: LEFT HEART CATH AND CORONARY ANGIOGRAPHY;  Surgeon: Yvonne Kendall, MD;  Location: [facility] INVASIVE CV LAB;  Service: Cardiovascular;  Laterality: N/A;   LEFT HEART CATHETERIZATION WITH CORONARY ANGIOGRAM N/A 06/07/2013   Procedure: LEFT HEART CATHETERIZATION WITH CORONARY ANGIOGRAM;  Surgeon: Kathleene Hazel, MD;  Location: [facility] CATH LAB;  Service: Cardiovascular;  Laterality: N/A;   LEFT HEART CATHETERIZATION WITH CORONARY ANGIOGRAM N/A 02/25/2014   Procedure: LEFT HEART CATHETERIZATION WITH CORONARY ANGIOGRAM;  Surgeon: Lennette Bihari, MD;  Location: [facility] CATH LAB;  Service: Cardiovascular;  Laterality: N/A;   LUMBAR FUSION  01/2007   DR. Noel Gerold...3-LEVEL WITH FIXATION   OOPHORECTOMY     BSO? pt.unsure   PARATHYROIDECTOMY     SPINE SURGERY     THYROIDECTOMY     TOTAL ABDOMINAL HYSTERECTOMY      reports that she quit smoking about 36 years ago. Her smoking use included cigarettes. She started smoking about 66 years ago. She has never used smokeless tobacco. She reports that she does not drink alcohol and does not use drugs. family history includes Arthritis in her maternal aunt; Breast cancer in her maternal aunt; Diabetes in her brother and maternal aunt; Heart attack (age of onset: 67) in her mother; Heart attack (age of onset: 67) in her father; Heart disease in her father and mother. Allergies  Allergen Reactions   Crestor [Rosuvastatin Calcium] Other (See Comments)    Feeling poor   Prilosec [Omeprazole] Other (See Comments)    Chest pain   Miconazole Nitrate Hives    REACTION: hives   Augmentin [Amoxicillin-Pot Clavulanate] Hives, Itching and Rash    Has patient had a PCN reaction causing immediate rash, facial/tongue/throat swelling, SOB or lightheadedness with hypotension:unsure Has patient had a PCN reaction causing severe rash involving mucus  membranes or skin necrosis:unsure Has patient had a PCN reaction that required hospitalization:No Has patient had a PCN reaction occurring within the last 10 years:NO If all of the above answers are "NO", then may proceed with Cephalosporin use. Has patient had a PCN reaction causing immediate rash, facial/tongue/throat swelling, SOB or lightheadedness with hypotension:unsure Has patient had a PCN reaction causing severe rash involving mucus membranes or skin necrosis:unsure Has patient had a PCN reaction that required hospitalization:No Has patient had a PCN reaction occurring within the last 10 years:NO If all of the above answers are "NO", then may proceed with Cephalosporin use.    Doxycycline Other (See Comments)    REACTION: gi upset   Current Outpatient Medications on File Prior to Visit  Medication Sig Dispense Refill   aspirin 81 MG EC tablet Take 81 mg by mouth daily.  atenolol (TENORMIN) 25 MG tablet TAKE 1 TABLET BY MOUTH EVERY DAY. 90 tablet 1   fluconazole (DIFLUCAN) 150 MG tablet Take one tab by mouth every 3 days as needed 2 tablet 1   Foot Care Products ([employer] GEL HEEL CUSHION WOMENS) PADS Use as directed at bedtime 2 each 5   gabapentin (NEURONTIN) 300 MG capsule Take 1-2 tab by mouth at bedtime for pain and sleep (Patient taking differently: Take 300 mg by mouth as needed. Take 1-2 tab by mouth at bedtime for pain and sleep) 180 capsule 1   isosorbide mononitrate (IMDUR) 30 MG 24 hr tablet Take 0.5 tablets (15 mg total) by mouth daily. 30 tablet 0   levothyroxine (SYNTHROID) 100 MCG tablet Take 1 tablet (100 mcg total) by mouth daily before breakfast. 30 tablet 0   Triamcinolone Acetonide (ZILRETTA) 32 MG SRER intra-articular injection 5 mL, intra-articular, bilat knees 2 each 0   atorvastatin (LIPITOR) 40 MG tablet Take 1 tablet (40 mg total) by mouth daily. (Patient not taking: Reported on 03/21/2023) 90 tablet 3   No current facility-administered medications on file  prior to visit.        ROS:  All others reviewed and negative.  Objective        PE:  BP 122/66 (BP Location: Right Arm, Patient Position: Sitting, Cuff Size: Normal)   Pulse 75   Temp 98.4 F (36.9 C) (Oral)   Ht 5\' 6"  (1.676 m)   Wt 163 lb (73.9 kg)   SpO2 98%   BMI 26.31 kg/m                 Constitutional: Pt appears in NAD               HENT: Head: NCAT.                Right Ear: External ear normal.                 Left Ear: External ear normal.                Eyes: . Pupils are equal, round, and reactive to light. Conjunctivae and EOM are normal               Nose: without d/c or deformity               Neck: Neck supple. Gross normal ROM               Cardiovascular: Normal rate and regular rhythm.                 Pulmonary/Chest: Effort normal and breath sounds without rales or wheezing.                Abd:  Soft, NT, ND, + BS, no organomegaly               Neurological: Pt is alert. At baseline orientation, motor grossly intact               Skin: Skin is warm. No rashes, no other new lesions, LE edema - none               Psychiatric: Pt behavior is normal without agitation   Micro: none  Cardiac tracings I have personally interpreted today:  none  Pertinent Radiological findings (summarize): none   Lab Results  Component Value Date   WBC 9.9 04/10/2023   HGB 13.8 04/10/2023   HCT 43.4 04/10/2023  PLT 407 (H) 04/10/2023   GLUCOSE 100 (H) 04/10/2023   CHOL 119 03/22/2023   TRIG 62 03/22/2023   HDL 22 (L) 03/22/2023   LDLDIRECT 131.0 03/27/2015   LDLCALC 85 03/22/2023   ALT 5 04/10/2023   AST 13 (L) 04/10/2023   NA 136 04/10/2023   K 4.2 04/10/2023   CL 99 04/10/2023   CREATININE 0.88 04/10/2023   BUN 7 (L) 04/10/2023   CO2 29 04/10/2023   TSH 0.067 (L) 03/22/2023   INR 1.2 03/27/2023   HGBA1C 5.5 02/21/2023   MICROALBUR <0.7 02/21/2023   Assessment/Plan:  Danaysia GAYLE MARTINEZ is a 78 y.o. Black or African American [2] female with  has a past medical  history of AAA (abdominal aortic aneurysm) (HCC), Acute ischemic colitis (HCC) (07/29/2010), Acute respiratory infection (06/09/2014), Acute sinus infection (05/14/2015), Allergic rhinitis, cause unspecified, Angioedema of lips (06/03/2014), Anxiety state, unspecified, Arthritis of left hip (04/16/2015), Bilateral hearing loss (07/19/2017), Chronic LBP, Coronary artery disease, Coronary artery disease involving native heart without angina pectoris (07/31/2006), Degeneration of lumbar or lumbosacral intervertebral disc, Depressive disorder, not elsewhere classified, Diabetes (HCC) (11/03/2006), Diabetes mellitus, Frequent urination (05/08/2014), Gout (08/22/2013), History of endovascular stent graft for abdominal aortic aneurysm (AAA) (01/22/2016), Hyperlipidemia, Hypertension, Hypothyroidism, Iron deficiency anemia (06/13/2017), Lower GI bleed (06/2010), Lumbar radiculopathy (05/15/2015), MENOPAUSAL DISORDER (10/29/2007), Myalgia (09/25/2013), Noncompliance with medications (02/26/2014), Obesity, unspecified, OTITIS MEDIA, SEROUS, CHRONIC (04/23/2007), Thoracic disc disease (11/19/2018), URTICARIA (09/23/2009), and Venous insufficiency (07/19/2017).  Chest pain C/w cancer pain, for hydrocodone 10- 325 prn,  to f/u any worsening symptoms or concerns, also toradol 30 mg IM  Right otitis media Mild to mod, for antibx course levaquin 500 every day,   to f/u any worsening symptoms or concerns  Constipation Also for miralax 17 gm once daily  Insomnia Also for temazepam 15 at bedtime prn  Followup: Return in about 4 weeks (around 05/08/2023).  Oliver Barre, MD 04/10/2023 8:46 PM [facility]

## 2023-04-10 NOTE — Assessment & Plan Note (Signed)
 Also for temazepam 15 at bedtime prn

## 2023-04-10 NOTE — Assessment & Plan Note (Signed)
 Also for miralax 17 gm once daily

## 2023-04-10 NOTE — Progress Notes (Signed)
 I met the pt today face to face at her med onc consult with Dr.Mohamed. Pt was accompanied by her daughter, Leonard Schwartz. The plan for the pt is to complete her cancer work up with a brain MRI and PET scan, which she wishes to take place at [facility].  Request for pt's tissue be sent for PD-L1 emailed to Gloris Manchester, [facility] Path tech.  Referral to palliative care placed to establish management of pts cancer related pain.  Tramaine asked if FMLA paperwork can filled out by our clinic. I told Tramaine we have staff dedicated to filing FMLA paperwork and requested she bring it with her to the pt's follow up appt.  I provided the pt and her daughter with my card and encouraged them to call me with any questions or concerns. I escorted the pt and Tramaine to scheduling and a follow up appt with C.Heilingoetter, Onc PA and Dr.Mohamed was made for 4/10.

## 2023-04-10 NOTE — Patient Instructions (Addendum)
 You had the pain shot today (toradol)  Please take all new medication as prescribed  - the hydrocodone as needed for pain, and Miralax 17 gm once daily for constipation  Please take all new medication as prescribed - the antibiotic Levaquin for the right ear  Ok to take the Temazepam 15 mg at bedtime for sleep as needed  Please continue all other medications as before, and refills have been done if requested.  Please have the pharmacy call with any other refills you may need.  Please keep your appointments with your specialists as you may have planned  Please make an Appointment to return in 1 months, or sooner if needed

## 2023-04-10 NOTE — Progress Notes (Signed)
 [facility] [phone]   [phone]  CONSULT NOTE  REFERRING PHYSICIAN: Dr. Levy Pupa  REASON FOR CONSULTATION:  78 years old African-American female recently diagnosed with lung cancer  HPI [name] is a 78 y.o. female.   Discussed the use of AI scribe software for clinical note transcription with the patient, who gave verbal consent to proceed.  History of Present Illness   [name] is a 78 year old female with lung cancer who presents with severe right-sided chest pain. She is accompanied by her daughter, [name]. She was referred by Dr. Jonny Ruiz for evaluation of lung cancer.  The patient presents with severe right-sided chest pain, which began after a fall in February 2025. A chest X-ray ordered by her primary care physician revealed a right upper lobe opacity, initially thought to be an infection. A subsequent CT scan on March 20, 2023, showed an 8.2 x 2.6 x 7.1 cm pleural-based right upper lobe mass close to the chest wall, with suspicious lymph nodes in the mediastinum. A CT-guided biopsy on March 27, 2023, confirmed squamous cell carcinoma of the lung.  She experiences severe right-sided chest pain, rated as 10 out of 10 in severity, located on the lower part of her ribcage. She also reports numbness in her right hand and difficulty sleeping due to the pain. She uses tramadol and oxycodone for pain management but reports inadequate control. Additionally, she takes Bactrim DS twice a day. No recent cough, but she mentions blood in her saliva. She reports constipation, likely due to pain medication use, and denies nausea, vomiting, or diarrhea.  She has experienced a weight loss of approximately 13 pounds recently, dropping from 175 to 162 pounds.  Her past medical history includes high blood pressure, diabetes, anemia, abdominal aneurysm, ischemic colitis, high cholesterol, hypothyroidism, depression, heart disease, and a history of  three back surgeries due to a fall at work.  She denies any family history of cancer. Her mother died of cardiac arrest, and her father died of a heart attack. She has a brother who was diabetic but has no known cancer history.  Socially, she is divorced and has three children, including an older daughter and a set of twins. 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 She has a significant smoking history, having smoked for over sixty years, starting in her teens, and reportedly quit about a month ago. She denies alcohol and illicit drug use.      HPI  Past Medical History:  Diagnosis Date   AAA (abdominal aortic aneurysm) (HCC)    a. s/p stent graft repair 01/2016.   Acute ischemic colitis (HCC) 07/29/2010   Diagnosed June, 2012 characterized by acute lower GI bleeding, spontaneously resolved.    Acute respiratory infection 06/09/2014   Acute sinus infection 05/14/2015   Allergic rhinitis, cause unspecified    Angioedema of lips 06/03/2014   Anxiety state, unspecified    Arthritis of left hip 04/16/2015   Bilateral hearing loss 07/19/2017   Chronic LBP    Coronary artery disease    a. inferior MI 1998 s/p PCI of RCA. b. stenting of Cx 04/2010. c. DES to prox LAD 05/2013. d. DES x 3 in 10/2015. // Myoview 07/2018:  EF 59, normal perfusion, low risk    Coronary artery disease involving native heart without angina pectoris 07/31/2006   Qualifier: Diagnosis of  By: Debby Bud MD, Rosalyn Gess  ANGIOGRAPHIC DATA:  1. Ventriculography  done in the RAO projection reveals a small wall  motion abnormality in the mid inferior wall. Ejection fraction  would be estimated at around 50%.  2. The right coronary artery has some progressive disease of about 60-  80% in the proximal mid segment. The distal vessel appears to be  50-70% narrowing althou   Degeneration of lumbar or lumbosacral intervertebral disc    Depressive disorder, not elsewhere classified    Diabetes (HCC) 11/03/2006   Qualifier:  Diagnosis of  By: Jonny Ruiz MD, Len Blalock    Diabetes mellitus    TYPE II   Frequent urination 05/08/2014   Gout 08/22/2013   History of endovascular stent graft for abdominal aortic aneurysm (AAA) 01/22/2016   Hyperlipidemia    Hypertension    Hypothyroidism    Iron deficiency anemia 06/13/2017   Lower GI bleed 06/2010   Diverticular bleed   Lumbar radiculopathy 05/15/2015   MENOPAUSAL DISORDER 10/29/2007   Qualifier: Diagnosis of  By: Jonny Ruiz MD, Len Blalock    Myalgia 09/25/2013   Noncompliance with medications 02/26/2014   Obesity, unspecified    OTITIS MEDIA, SEROUS, CHRONIC 04/23/2007   Qualifier: Diagnosis of  By: Debby Bud MD, Rosalyn Gess    Thoracic disc disease 11/19/2018   URTICARIA 09/23/2009   Qualifier: Diagnosis of  By: Felicity Coyer MD, Vikki Ports A    Venous insufficiency 07/19/2017    Past Surgical History:  Procedure Laterality Date   CARDIAC CATHETERIZATION     PCI OF BOTH THE CIRCUMFLEX AND LEFT ANTERIOR DESCENDING ARTERY   CARDIAC CATHETERIZATION N/A 10/29/2015   Procedure: Coronary Stent Intervention;  Surgeon: Yvonne Kendall, MD;  Location: [facility] INVASIVE CV [facility];  Service: Cardiovascular;  Laterality: N/A;   CARDIAC CATHETERIZATION N/A 10/29/2015   Procedure: Coronary/Graft Angiography;  Surgeon: Yvonne Kendall, MD;  Location: [facility] INVASIVE CV [facility];  Service: Cardiovascular;  Laterality: N/A;   CARDIAC CATHETERIZATION N/A 10/29/2015   Procedure: Intravascular Pressure Wire/FFR Study;  Surgeon: Yvonne Kendall, MD;  Location: [facility] INVASIVE CV LAB;  Service: Cardiovascular;  Laterality: N/A;   CESAREAN SECTION     ENDOVASCULAR STENT INSERTION N/A 01/22/2016   Procedure: ABDOMINAL AORTIC ENDOVASCULAR STENT GRAFT INSERTION;  Surgeon: Larina Earthly, MD;  Location: [facility] OR;  Service: Vascular;  Laterality: N/A;   HEART STENT  04-2010  and  Jun 07, 2013   X 3   LEFT HEART CATH AND CORONARY ANGIOGRAPHY N/A 04/03/2019   Procedure: LEFT HEART CATH AND CORONARY ANGIOGRAPHY;  Surgeon: Yvonne Kendall, MD;  Location: [facility]  INVASIVE CV LAB;  Service: Cardiovascular;  Laterality: N/A;   LEFT HEART CATHETERIZATION WITH CORONARY ANGIOGRAM N/A 06/07/2013   Procedure: LEFT HEART CATHETERIZATION WITH CORONARY ANGIOGRAM;  Surgeon: Kathleene Hazel, MD;  Location: [facility] CATH LAB;  Service: Cardiovascular;  Laterality: N/A;   LEFT HEART CATHETERIZATION WITH CORONARY ANGIOGRAM N/A 02/25/2014   Procedure: LEFT HEART CATHETERIZATION WITH CORONARY ANGIOGRAM;  Surgeon: Lennette Bihari, MD;  Location: [facility] CATH LAB;  Service: Cardiovascular;  Laterality: N/A;   LUMBAR FUSION  01/2007   DR. Noel Gerold...3-LEVEL WITH FIXATION   OOPHORECTOMY     BSO? pt.unsure   PARATHYROIDECTOMY     SPINE SURGERY     THYROIDECTOMY     TOTAL ABDOMINAL HYSTERECTOMY      Family History  Problem Relation Age of Onset   Heart attack Mother 86       s/p D&C-CARDIAC ARREST 1966   Heart disease Mother    Heart attack Father 72  1978 WITH MI   Heart disease Father    Diabetes Brother    Diabetes Maternal Aunt    Arthritis Maternal Aunt    Breast cancer Maternal Aunt        Post menopausal   Colon cancer Neg Hx     Social History Social History   Tobacco Use   Smoking status: Former    Current packs/day: 0.00    Types: Cigarettes    Start date: 05/30/1956    Quit date: 05/31/1986    Years since quitting: 36.8   Smokeless tobacco: Never  Vaping Use   Vaping status: Never Used  Substance Use Topics   Alcohol use: No   Drug use: No    Allergies  Allergen Reactions   Crestor [Rosuvastatin Calcium] Other (See Comments)    Feeling poor   Prilosec [Omeprazole] Other (See Comments)    Chest pain   Miconazole Nitrate Hives    REACTION: hives   Augmentin [Amoxicillin-Pot Clavulanate] Hives, Itching and Rash    Has patient had a PCN reaction causing immediate rash, facial/tongue/throat swelling, SOB or lightheadedness with hypotension:unsure Has patient had a PCN reaction causing severe rash involving mucus membranes or skin  necrosis:unsure Has patient had a PCN reaction that required hospitalization:No Has patient had a PCN reaction occurring within the last 10 years:NO If all of the above answers are "NO", then may proceed with Cephalosporin use. Has patient had a PCN reaction causing immediate rash, facial/tongue/throat swelling, SOB or lightheadedness with hypotension:unsure Has patient had a PCN reaction causing severe rash involving mucus membranes or skin necrosis:unsure Has patient had a PCN reaction that required hospitalization:No Has patient had a PCN reaction occurring within the last 10 years:NO If all of the above answers are "NO", then may proceed with Cephalosporin use.    Doxycycline Other (See Comments)    REACTION: gi upset    Current Outpatient Medications  Medication Sig Dispense Refill   allopurinol (ZYLOPRIM) 300 MG tablet Take 1 tablet (300 mg total) by mouth as needed (for Gout). (Patient not taking: Reported on 03/21/2023) 90 tablet 3   aspirin 81 MG EC tablet Take 81 mg by mouth daily.      atenolol (TENORMIN) 25 MG tablet TAKE 1 TABLET BY MOUTH EVERY DAY. 90 tablet 1   atorvastatin (LIPITOR) 40 MG tablet Take 1 tablet (40 mg total) by mouth daily. (Patient not taking: Reported on 03/21/2023) 90 tablet 3   fluconazole (DIFLUCAN) 150 MG tablet Take one tab by mouth every 3 days as needed 2 tablet 1   Foot Care Products ([employer] GEL HEEL CUSHION WOMENS) PADS Use as directed at bedtime 2 each 5   furosemide (LASIX) 40 MG tablet Take 1 tablet (40 mg total) by mouth daily. (Patient not taking: Reported on 03/21/2023) 90 tablet 3   gabapentin (NEURONTIN) 300 MG capsule Take 1-2 tab by mouth at bedtime for pain and sleep (Patient taking differently: Take 300 mg by mouth as needed. Take 1-2 tab by mouth at bedtime for pain and sleep) 180 capsule 1   isosorbide mononitrate (IMDUR) 30 MG 24 hr tablet Take 0.5 tablets (15 mg total) by mouth daily. 30 tablet 0   levothyroxine (SYNTHROID) 100 MCG tablet Take  1 tablet (100 mcg total) by mouth daily before breakfast. 30 tablet 0   nitroGLYCERIN (NITROSTAT) 0.4 MG SL tablet Place 1 tablet (0.4 mg total) under the tongue every 5 (five) minutes as needed for chest pain. (Patient not taking: Reported on  03/21/2023) 75 tablet 1   potassium chloride (KLOR-CON) 10 MEQ tablet Take 1 tablet (10 mEq total) by mouth daily. (Patient not taking: Reported on 03/21/2023) 90 tablet 3   sulfamethoxazole-trimethoprim (BACTRIM DS) 800-160 MG tablet Take 1 tablet by mouth 2 (two) times daily. 20 tablet 0   traMADol (ULTRAM) 50 MG tablet Take 1 tablet (50 mg total) by mouth every 6 (six) hours as needed for up to 15 doses. 15 tablet 0   Triamcinolone Acetonide (ZILRETTA) 32 MG SRER intra-articular injection 5 mL, intra-articular, bilat knees 2 each 0   No current facility-administered medications for this visit.    Review of Systems  Constitutional: positive for anorexia, fatigue, and weight loss Eyes: negative Ears, nose, mouth, throat, and face: negative Respiratory: positive for pleurisy/chest pain Cardiovascular: negative Gastrointestinal: negative Genitourinary:negative Integument/breast: negative Hematologic/lymphatic: negative Musculoskeletal:positive for bone pain Neurological: negative Behavioral/Psych: negative Endocrine: negative Allergic/Immunologic: negative  Physical Exam  ZOX:WRUEA, healthy, no distress, well nourished, well developed, and anxious SKIN: skin color, texture, turgor are normal, no rashes or significant lesions HEAD: Normocephalic, No masses, lesions, tenderness or abnormalities EYES: normal, PERRLA, Conjunctiva are pink and non-injected EARS: External ears normal, Canals clear OROPHARYNX:no exudate, no erythema, and lips, buccal mucosa, and tongue normal  NECK: supple, no adenopathy, no JVD LYMPH:  no palpable lymphadenopathy, no hepatosplenomegaly BREAST:not examined LUNGS: clear to auscultation , and palpation HEART: regular  rate & rhythm, no murmurs, and no gallops ABDOMEN:abdomen soft, non-tender, normal bowel sounds, and no masses or organomegaly BACK: Back symmetric, no curvature., No CVA tenderness EXTREMITIES:no joint deformities, effusion, or inflammation, no edema  NEURO: alert & oriented x 3 with fluent speech, no focal motor/sensory deficits  PERFORMANCE STATUS: ECOG 1  LABORATORY DATA: Lab Results  Component Value Date   WBC 9.9 04/10/2023   HGB 13.8 04/10/2023   HCT 43.4 04/10/2023   MCV 84.8 04/10/2023   PLT 407 (H) 04/10/2023      Chemistry      Component Value Date/Time   NA 136 04/10/2023 1119   NA 141 06/08/2021 1606   K 4.2 04/10/2023 1119   CL 99 04/10/2023 1119   CO2 29 04/10/2023 1119   BUN 7 (L) 04/10/2023 1119   BUN 4 (L) 06/08/2021 1606   CREATININE 0.88 04/10/2023 1119      Component Value Date/Time   CALCIUM 9.1 04/10/2023 1119   ALKPHOS 109 04/10/2023 1119   AST 13 (L) 04/10/2023 1119   ALT 5 04/10/2023 1119   BILITOT 0.6 04/10/2023 1119       RADIOGRAPHIC STUDIES: DG Chest Port 1 View Result Date: 03/27/2023 CLINICAL DATA:  [id number] Post-operative state [id number] EXAM: PORTABLE CHEST 1 VIEW COMPARISON:  Chest XR, 03/21/2023. CT chest, 03/20/2023. IR CT, earlier same day. FINDINGS: Cardiac silhouette is within normal limits. Lungs are hypoinflated. The LEFT lung is clear. Similar degree of RIGHT upper chest no pleural thickening, with associated pulmonary opacity and post procedural change. No pleural effusion or pneumothorax. Multilevel thoracic spine fusion. No acute osseous abnormality. IMPRESSION: Post biopsy changes at the RIGHT upper chest.  No pneumothorax. Electronically Signed   By: Roanna Banning M.D.   On: 03/27/2023 11:32   CT LUNG MASS BIOPSY Result Date: 03/27/2023 INDICATION: RIGHT pleural lung.  No diagnosis. EXAM: CT-GUIDED RIGHT PLEURAL MASS BIOPSY COMPARISON:  CT CHEST, 03/20/2023 MEDICATIONS: None. ANESTHESIA/SEDATION: Moderate (conscious) sedation was  employed during this procedure. A total of Versed 2 mg and Fentanyl 75 mcg was administered intravenously. Moderate Sedation Time:  23 minutes. The patient's level of consciousness and vital signs were monitored continuously by radiology nursing throughout the procedure under my direct supervision. CONTRAST:  None FLUOROSCOPY TIME:  CT dose; 536 mGycm COMPLICATIONS: None immediate. PROCEDURE: RADIATION DOSE REDUCTION: This exam was performed according to the departmental dose-optimization program which includes automated exposure control, adjustment of the mA and/or kV according to patient size and/or use of iterative reconstruction technique. Informed consent was obtained from the patient following an explanation of the procedure, risks, benefits and alternatives. The patient understands,agrees and consents for the procedure. All questions were addressed. A time out was performed prior to the initiation of the procedure. The patient was positioned supine on the CT table and a limited chest CT was performed for procedural planning demonstrating RIGHT pleural mass. The operative site was prepped and draped in the usual sterile fashion. Under sterile conditions and local anesthesia, a 17 gauge coaxial needle was advanced into the peripheral aspect of the nodule. Positioning was confirmed with intermittent CT fluoroscopy and followed by the acquisition of RIGHT pleural mass with an 18 gauge core needle biopsy device. The coaxial needle was removed following deployment of hemostatic patch and superficial hemostasis was achieved with manual compression. Limited post procedural chest CT was negative for pneumothorax or additional complication. A dressing was placed. The patient tolerated the procedure well without immediate postprocedural complication. The patient was escorted to have an upright chest radiograph. IMPRESSION: Successful CT guided core needle core biopsy of RIGHT pleural mass. Roanna Banning, MD [facility] Electronically Signed   By: Roanna Banning M.D.   On: 03/27/2023 10:59   ECHOCARDIOGRAM COMPLETE Result Date: 03/22/2023    ECHOCARDIOGRAM REPORT   Patient Name:   LAURELYN TERRERO Date of Exam: 03/22/2023 Medical Rec #:  [id number]      Height:       66.0 in Accession #:    [id number]     Weight:       176.0 lb Date of Birth:  1945-10-14       BSA:          1.894 m Patient Age:    77 years       BP:           134/64 mmHg Patient Gender: F              HR:           68 bpm. Exam Location:  Inpatient Procedure: 2D Echo, Cardiac Doppler and Color Doppler (Both Spectral and Color            Flow Doppler were utilized during procedure). Indications:    Chest Pain  History:        Patient has prior history of Echocardiogram examinations, most                 recent 02/03/2021. CAD; AAA.  Sonographer:    Amy Chionchio Referring Phys: Midge Minium, N IMPRESSIONS  1. Abnormal septal motion Strain and 3D EF not performed. Left ventricular ejection fraction, by estimation, is 50 to 55%. The left ventricle has low normal function. The left ventricle has no regional wall motion abnormalities. Left ventricular diastolic parameters were normal.  2. Right ventricular systolic function is normal. The right ventricular size is normal.  3. The mitral valve is abnormal. Trivial mitral valve regurgitation. No evidence of mitral stenosis.  4. The aortic valve is tricuspid. There is moderate calcification of the aortic valve.  There is moderate thickening of the aortic valve. Aortic valve regurgitation is not visualized. Aortic valve sclerosis/calcification is present, without any evidence of aortic stenosis.  5. The inferior vena cava is normal in size with greater than 50% respiratory variability, suggesting right atrial pressure of 3 mmHg. FINDINGS  Left Ventricle: Abnormal septal motion Strain and 3D EF not performed. Left ventricular ejection fraction, by estimation, is 50 to  55%. The left ventricle has low normal function. The left ventricle has no regional wall motion abnormalities. Strain was performed and the global longitudinal strain is indeterminate. The left ventricular internal cavity size was normal in size. There is no left ventricular hypertrophy. Left ventricular diastolic parameters were normal. Right Ventricle: The right ventricular size is normal. No increase in right ventricular wall thickness. Right ventricular systolic function is normal. Left Atrium: Left atrial size was normal in size. Right Atrium: Right atrial size was normal in size. Pericardium: There is no evidence of pericardial effusion. Mitral Valve: The mitral valve is abnormal. There is mild thickening of the mitral valve leaflet(s). There is mild calcification of the mitral valve leaflet(s). Mild mitral annular calcification. Trivial mitral valve regurgitation. No evidence of mitral valve stenosis. MV peak gradient, 5.3 mmHg. The mean mitral valve gradient is 2.0 mmHg. Tricuspid Valve: The tricuspid valve is normal in structure. Tricuspid valve regurgitation is mild . No evidence of tricuspid stenosis. Aortic Valve: The aortic valve is tricuspid. There is moderate calcification of the aortic valve. There is moderate thickening of the aortic valve. Aortic valve regurgitation is not visualized. Aortic valve sclerosis/calcification is present, without any  evidence of aortic stenosis. Aortic valve mean gradient measures 4.0 mmHg. Aortic valve peak gradient measures 6.7 mmHg. Aortic valve area, by VTI measures 2.31 cm. Pulmonic Valve: The pulmonic valve was normal in structure. Pulmonic valve regurgitation is not visualized. No evidence of pulmonic stenosis. Aorta: The aortic root is normal in size and structure. Venous: The inferior vena cava is normal in size with greater than 50% respiratory variability, suggesting right atrial pressure of 3 mmHg. IAS/Shunts: No atrial level shunt detected by color flow  Doppler. Additional Comments: 3D was performed not requiring image post processing on an independent workstation and was indeterminate.  LEFT VENTRICLE PLAX 2D LVIDd:         4.90 cm      Diastology LVIDs:         3.10 cm      LV e' medial:    4.57 cm/s LV PW:         0.80 cm      LV E/e' medial:  17.2 LV IVS:        0.80 cm      LV e' lateral:   9.36 cm/s LVOT diam:     2.00 cm      LV E/e' lateral: 8.4 LV SV:         60 LV SV Index:   32 LVOT Area:     3.14 cm  LV Volumes (MOD) LV vol d, MOD A2C: 105.0 ml LV vol d, MOD A4C: 91.8 ml LV vol s, MOD A4C: 42.4 ml LV SV MOD A4C:     91.8 ml RIGHT VENTRICLE            IVC RV Basal diam:  3.50 cm    IVC diam: 1.40 cm RV S prime:     8.05 cm/s TAPSE (M-mode): 1.5 cm LEFT ATRIUM  Index        RIGHT ATRIUM           Index LA Vol (A2C):   54.8 ml 28.93 ml/m  RA Area:     12.70 cm LA Vol (A4C):   67.0 ml 35.37 ml/m  RA Volume:   30.40 ml  16.05 ml/m LA Biplane Vol: 61.6 ml 32.52 ml/m  AORTIC VALVE                    PULMONIC VALVE AV Area (Vmax):    2.31 cm     PV Vmax:       0.62 m/s AV Area (Vmean):   2.36 cm     PV Peak grad:  1.5 mmHg AV Area (VTI):     2.31 cm AV Vmax:           129.00 cm/s AV Vmean:          88.000 cm/s AV VTI:            0.260 m AV Peak Grad:      6.7 mmHg AV Mean Grad:      4.0 mmHg LVOT Vmax:         95.00 cm/s LVOT Vmean:        66.100 cm/s LVOT VTI:          0.191 m LVOT/AV VTI ratio: 0.73  AORTA Ao Root diam: 2.60 cm Ao Asc diam:  2.70 cm MITRAL VALVE MV Area (PHT): 3.10 cm    SHUNTS MV Area VTI:   1.92 cm    Systemic VTI:  0.19 m MV Peak grad:  5.3 mmHg    Systemic Diam: 2.00 cm MV Mean grad:  2.0 mmHg MV Vmax:       1.15 m/s MV Vmean:      69.5 cm/s MV Decel Time: 245 msec MV E velocity: 78.60 cm/s MV A velocity: 89.60 cm/s MV E/A ratio:  0.88 Brandi Haws MD Electronically signed by Brandi Haws MD Signature Date/Time: 03/22/2023/4:35:38 PM    Final    DG Chest 2 View Result Date: 03/21/2023 CLINICAL DATA:  Intermittent  chest pain EXAM: CHEST - 2 VIEW COMPARISON:  CT chest dated 03/20/2023, chest radiograph dated 02/21/2023 FINDINGS: Patient is rotated to the right. Normal lung volumes. Superolateral right lung opacity, better evaluated on recent chest CT. Minimal adjacent interstitial opacities. No pleural effusion or pneumothorax. The heart size and mediastinal contours are within normal limits. No acute osseous abnormality. Partially imaged thoracolumbar spinal fixation hardware appears intact. Partially imaged aortic stent graft. IMPRESSION: Superolateral right lung opacity, better evaluated on recent chest CT. No new focal consolidations. Electronically Signed   By: Agustin Cree M.D.   On: 03/21/2023 19:57   CT Chest W Contrast Result Date: 03/20/2023 CLINICAL DATA:  Peripheral right upper lobe opacity which may be cavitary seen on chest radiographs dated 02/21/2023. Chest CT was recommended for further evaluation. EXAM: CT CHEST WITH CONTRAST TECHNIQUE: Multidetector CT imaging of the chest was performed during intravenous contrast administration. RADIATION DOSE REDUCTION: This exam was performed according to the departmental dose-optimization program which includes automated exposure control, adjustment of the mA and/or kV according to patient size and/or use of iterative reconstruction technique. CONTRAST:  75mL ISOVUE-300 IOPAMIDOL (ISOVUE-300) INJECTION 61% COMPARISON:  Chest radiographs dated 02/21/2023 and 04/03/2019. Chest CTA dated 06/11/2017. FINDINGS: Cardiovascular: Atheromatous calcifications, including the coronary arteries and aorta. Normal-sized heart. No pericardial effusion. Mediastinum/Nodes: Surgically absent left lobe thyroid and isthmus. Normal-appearing  right lobe. Mildly enlarged right anterior paratracheal/precarinal node with a short axis diameter of 14 mm on image number 135/10 without significant change since 06/11/2017. No interval enlarged lymph nodes elsewhere. Unremarkable trachea and esophagus.  Lungs/Pleura: Irregular, pleural-based mass laterally in the right upper chest. This contains a minimal air or gas-filled cavitary component and is predominantly low-density centrally, possibly necrotic or filled with fluid. This is difficult to assess due to extensive streak artifact from spinal hardware. This area measures 8.2 x 2.6 cm on image number 89/10. This measures 7.1 cm in length on coronal image number 97/4. No pleural fluid elsewhere on either side. There are patchy, linear and interstitial densities in the right upper lobe adjacent to and inferior to the pleural-based mass. Clear left lung.  No lung nodules seen. Upper Abdomen: Extensive streak artifacts from spine fixation hardware. Multiple small colonic diverticula. Oval area of low density in the left lobe of the liver measuring 4.2 x 3.3 cm on image number 297/10. This measures 19 Hounsfield units in density, including increased density associated with streak artifacts. This measured 2.7 x 2.1 cm and -4 Hounsfield units in density on 02/24/2017, compatible with a simple cyst, not requiring imaging follow-up. Musculoskeletal: Pedicle screw and rod fixation throughout the thoracic and lumbar spine from the upper thoracic spine down with associated extensive streak artifacts. Extensive thoracic and lower cervical spine degenerative changes. Grade 1 anterolisthesis in the lower thoracic spine with pedicle screw tracts compatible with removed screws. IMPRESSION: 1. 8.2 x 2.6 x 7.1 cm irregular, pleural-based mass laterally in the right upper chest with a minimal air or gas-filled cavitary component and predominantly low-density centrally, possibly necrotic or filled with fluid. This is difficult to assess due to extensive streak artifacts from spinal hardware. This could represent a necrotic malignancy or an infectious process with a empyema. 2. Patchy, linear and interstitial densities in the right upper lobe adjacent to and inferior to the  pleural-based mass, likely due to infection. This is more extensive and pronounced than expected with lymphangitic spread of tumor. 3. Stable mildly enlarged right anterior paratracheal/precarinal lymph node since 06/11/2017, compatible with a benign lymph node. 4.  Calcific coronary artery and aortic atherosclerosis. 5. Colonic diverticulosis. Aortic Atherosclerosis (ICD10-I70.0). Electronically Signed   By: Beckie Salts M.D.   On: 03/20/2023 17:37   MM 3D SCREENING MAMMOGRAM BILATERAL BREAST Result Date: 03/17/2023 CLINICAL DATA:  Screening. EXAM: DIGITAL SCREENING BILATERAL MAMMOGRAM WITH TOMOSYNTHESIS AND CAD TECHNIQUE: Bilateral screening digital craniocaudal and mediolateral oblique mammograms were obtained. Bilateral screening digital breast tomosynthesis was performed. The images were evaluated with computer-aided detection. COMPARISON:  Previous exam(s). ACR Breast Density Category a: The breasts are almost entirely fatty. FINDINGS: There are no findings suspicious for malignancy. IMPRESSION: No mammographic evidence of malignancy. A result letter of this screening mammogram will be mailed directly to the patient. RECOMMENDATION: Screening mammogram in one year. (Code:[id number]) BI-RADS CATEGORY  1: Negative. Electronically Signed   By: Frederico Hamman M.D.   On: 03/17/2023 15:17    ASSESSMENT: This is a very pleasant 78 years old African-American female with highly suspicious stage IIIA/IIIB (T4, N0/N0, MX) non-small cell lung cancer, squamous cell carcinoma pending additional staging workup presenting with large pleural-based peripheral right upper lobe lung mass measuring up to 8.2 cm in size with questionable mediastinal lymphadenopathy diagnosed in March 2025   PLAN: I had a lengthy discussion with the patient and her daughter today about her current disease stage, prognosis and treatment options.  I personally  independently reviewed the scan images and discussed the result with the patient  and her daughter.    Squamous cell carcinoma of the lung, stage IIB/IIIB Diagnosed with a pleural-based right upper lobe chest wall mass measuring 8.2 x 2.6 x 7.1 cm. Suspicious mediastinal lymph nodes suggest stage IIIB. She reports severe right-sided chest pain, numbness in the right hand, and significant weight loss. The cancer is unresectable due to its size and location. Further staging is required to confirm the extent of disease. Radiation therapy is considered to alleviate pain and potentially shrink the tumor. Chemotherapy and immunotherapy may follow based on staging results. - Order PET scan and MRI of the brain to assess for metastasis - Refer to radiation oncologist for potential radiation therapy - Discuss potential future chemotherapy and immunotherapy - Refer to palliative care team for pain management - Instruct her to contact primary care physician for pain medication refill until palliative care appointment  Chronic pain Experiences severe right-sided chest pain, rated 10/10, likely due to the lung mass. Current pain management includes tramadol and oxycodone, but she reports running out of tramadol. - Refer to palliative care team for pain management - Instruct her to contact primary care physician for pain medication refill until palliative care appointment  Constipation Reports constipation, likely secondary to opioid use for pain management. - Refer to palliative care team for management of constipation  Follow-up Follow-up is necessary to evaluate the results of the PET scan and MRI, and to discuss further treatment options. - Schedule follow-up appointment in two weeks to discuss PET scan and MRI results and treatment options   The patient was advised to call immediately if she has any other concerning symptoms in the interval. The patient voices understanding of current disease status and treatment options and is in agreement with the current care plan.  All  questions were answered. The patient knows to call the clinic with any problems, questions or concerns. We can certainly see the patient much sooner if necessary.  Thank you so much for allowing me to participate in the care of [name]. I will continue to follow up the patient with you and assist in her care.  The total time spent in the appointment was 60 minutes.  Disclaimer: This note was dictated with voice recognition software. Similar sounding words can inadvertently be transcribed and may not be corrected upon review.   Lajuana Matte April 10, 2023, 12:11 PM

## 2023-04-11 ENCOUNTER — Telehealth: Payer: Self-pay | Admitting: Radiation Oncology

## 2023-04-11 ENCOUNTER — Ambulatory Visit (INDEPENDENT_AMBULATORY_CARE_PROVIDER_SITE_OTHER): Payer: Medicare PPO

## 2023-04-11 VITALS — Ht 66.0 in | Wt 163.0 lb

## 2023-04-11 DIAGNOSIS — Z780 Asymptomatic menopausal state: Secondary | ICD-10-CM

## 2023-04-11 DIAGNOSIS — Z0000 Encounter for general adult medical examination without abnormal findings: Secondary | ICD-10-CM

## 2023-04-11 DIAGNOSIS — M8580 Other specified disorders of bone density and structure, unspecified site: Secondary | ICD-10-CM | POA: Diagnosis not present

## 2023-04-11 NOTE — Patient Instructions (Addendum)
 [name] , Thank you for taking time to come for your Medicare Wellness Visit. I appreciate your ongoing commitment to your health goals. Please review the following plan we discussed and let me know if I can assist you in the future.   Referrals/Orders/Follow-Ups/Clinician Recommendations: It was nice talking with you today.  You are due for a Tetanus vaccine and a bone density screening.  Each day, aim for 6 glasses of water, plenty of protein in your diet and try to get up and walk/ stretch every hour for 5-10 minutes at a time.  Remember to let office know who you see for your eye care, as we need records for your up coming eye exam.  You have an order for:  [x]   Bone Density     Please call for appointment:  [facility] [address] [place], [place] [address] [phone]   Make sure to wear two-piece clothing.  No lotions, powders, or deodorants the day of the appointment. Make sure to bring picture ID and insurance card.  Bring list of medications you are currently taking including any supplements.    This is a list of the screening recommended for you and due dates:  Health Maintenance  Topic Date Due   DTaP/Tdap/Td vaccine (1 - Tdap) 02/22/2013   Medicare Annual Wellness Visit  04/05/2023   Eye exam for diabetics  08/15/2023   Hemoglobin A1C  08/21/2023   Yearly kidney health urinalysis for diabetes  02/21/2024   Complete foot exam   02/21/2024   Yearly kidney function blood test for diabetes  04/09/2024   Pneumonia Vaccine  Completed   Flu Shot  Completed   DEXA scan (bone density measurement)  Completed   Hepatitis C Screening  Completed   Zoster (Shingles) Vaccine  Completed   HPV Vaccine  Aged Out   Colon Cancer Screening  Discontinued   COVID-19 Vaccine  Discontinued    Advanced directives: (Declined) Advance directive discussed with you today. Even though you declined this today, please call our office should you change your mind, and we  can give you the proper paperwork for you to fill out.  Next Medicare Annual Wellness Visit scheduled for next year: Yes

## 2023-04-11 NOTE — Progress Notes (Signed)
 Subjective:   [name] is a 78 y.o. who presents for a Medicare Wellness preventive visit.  Visit Complete: Virtual I connected with  [name] on 04/11/23 by a audio enabled telemedicine application and verified that I am speaking with the correct person using two identifiers.  Patient Location: Home  Provider Location: Home Office  I discussed the limitations of evaluation and management by telemedicine. The patient expressed understanding and agreed to proceed.  Vital Signs: Because this visit was a virtual/telehealth visit, some criteria may be missing or patient reported. Any vitals not documented were not able to be obtained and vitals that have been documented are patient reported.  VideoDeclined- This patient declined Librarian, academic. Therefore the visit was completed with audio only.  Persons Participating in Visit: Patient.  AWV Questionnaire: No: Patient Medicare AWV questionnaire was not completed prior to this visit.  Cardiac Risk Factors include: advanced age (>53men, >71 women);diabetes mellitus;hypertension;Other (see comment), Risk factor comments: CAD     Objective:    Today's Vitals   04/11/23 1318  Weight: 163 lb (73.9 kg)  Height: 5\' 6"  (1.676 m)   Body mass index is 26.31 kg/m.     04/11/2023    1:23 PM 03/22/2023    1:00 PM 03/21/2023    5:36 PM 04/05/2022    2:05 PM 10/29/2020    9:38 PM 10/27/2020    9:22 PM 10/27/2020    7:51 PM  Advanced Directives  Does Patient Have a Medical Advance Directive? No No No No No No No  Would patient like information on creating a medical advance directive?  No - Patient declined No - Patient declined No - Patient declined  No - Patient declined No - Patient declined    Current Medications (verified) Outpatient Encounter Medications as of 04/11/2023  Medication Sig   aspirin 81 MG EC tablet Take 81 mg by mouth daily.    atenolol (TENORMIN) 25 MG tablet TAKE 1 TABLET BY  MOUTH EVERY DAY.   fluconazole (DIFLUCAN) 150 MG tablet Take one tab by mouth every 3 days as needed   Foot Care Products ([employer] GEL HEEL CUSHION WOMENS) PADS Use as directed at bedtime   gabapentin (NEURONTIN) 300 MG capsule Take 1-2 tab by mouth at bedtime for pain and sleep (Patient taking differently: Take 300 mg by mouth as needed. Take 1-2 tab by mouth at bedtime for pain and sleep)   HYDROcodone-acetaminophen (NORCO) 10-325 MG tablet Take 1 tablet by mouth every 8 (eight) hours as needed.   isosorbide mononitrate (IMDUR) 30 MG 24 hr tablet Take 0.5 tablets (15 mg total) by mouth daily.   levofloxacin (LEVAQUIN) 500 MG tablet Take 1 tablet (500 mg total) by mouth daily.   levothyroxine (SYNTHROID) 100 MCG tablet Take 1 tablet (100 mcg total) by mouth daily before breakfast.   temazepam (RESTORIL) 15 MG capsule Take 1 capsule (15 mg total) by mouth at bedtime as needed for sleep.   Triamcinolone Acetonide (ZILRETTA) 32 MG SRER intra-articular injection 5 mL, intra-articular, bilat knees   atorvastatin (LIPITOR) 40 MG tablet Take 1 tablet (40 mg total) by mouth daily. (Patient not taking: Reported on 03/21/2023)   No facility-administered encounter medications on file as of 04/11/2023.    Allergies (verified) Crestor [rosuvastatin calcium], Prilosec [omeprazole], Miconazole nitrate, Augmentin [amoxicillin-pot clavulanate], and Doxycycline   History: Past Medical History:  Diagnosis Date   AAA (abdominal aortic aneurysm) (HCC)    a. s/p stent graft repair  01/2016.   Acute ischemic colitis (HCC) 07/29/2010   Diagnosed June, 2012 characterized by acute lower GI bleeding, spontaneously resolved.    Acute respiratory infection 06/09/2014   Acute sinus infection 05/14/2015   Allergic rhinitis, cause unspecified    Angioedema of lips 06/03/2014   Anxiety state, unspecified    Arthritis of left hip 04/16/2015   Bilateral hearing loss 07/19/2017   Chronic LBP    Coronary artery disease    a. inferior  MI 1998 s/p PCI of RCA. b. stenting of Cx 04/2010. c. DES to prox LAD 05/2013. d. DES x 3 in 10/2015. // Myoview 07/2018:  EF 59, normal perfusion, low risk    Coronary artery disease involving native heart without angina pectoris 07/31/2006   Qualifier: Diagnosis of  By: Debby Bud MD, Rosalyn Gess  ANGIOGRAPHIC DATA:  1. Ventriculography done in the RAO projection reveals a small wall  motion abnormality in the mid inferior wall. Ejection fraction  would be estimated at around 50%.  2. The right coronary artery has some progressive disease of about 60-  80% in the proximal mid segment. The distal vessel appears to be  50-70% narrowing althou   Degeneration of lumbar or lumbosacral intervertebral disc    Depressive disorder, not elsewhere classified    Diabetes (HCC) 11/03/2006   Qualifier: Diagnosis of  By: Jonny Ruiz MD, Len Blalock    Diabetes mellitus    TYPE II   Frequent urination 05/08/2014   Gout 08/22/2013   History of endovascular stent graft for abdominal aortic aneurysm (AAA) 01/22/2016   Hyperlipidemia    Hypertension    Hypothyroidism    Iron deficiency anemia 06/13/2017   Lower GI bleed 06/2010   Diverticular bleed   Lumbar radiculopathy 05/15/2015   MENOPAUSAL DISORDER 10/29/2007   Qualifier: Diagnosis of  By: Jonny Ruiz MD, Len Blalock    Myalgia 09/25/2013   Noncompliance with medications 02/26/2014   Obesity, unspecified    OTITIS MEDIA, SEROUS, CHRONIC 04/23/2007   Qualifier: Diagnosis of  By: Debby Bud MD, Rosalyn Gess    Thoracic disc disease 11/19/2018   URTICARIA 09/23/2009   Qualifier: Diagnosis of  By: Felicity Coyer MD, Vikki Ports A    Venous insufficiency 07/19/2017   Past Surgical History:  Procedure Laterality Date   CARDIAC CATHETERIZATION     PCI OF BOTH THE CIRCUMFLEX AND LEFT ANTERIOR DESCENDING ARTERY   CARDIAC CATHETERIZATION N/A 10/29/2015   Procedure: Coronary Stent Intervention;  Surgeon: Yvonne Kendall, MD;  Location: [facility] INVASIVE CV LAB;  Service: Cardiovascular;  Laterality: N/A;   CARDIAC  CATHETERIZATION N/A 10/29/2015   Procedure: Coronary/Graft Angiography;  Surgeon: Yvonne Kendall, MD;  Location: [facility] INVASIVE CV LAB;  Service: Cardiovascular;  Laterality: N/A;   CARDIAC CATHETERIZATION N/A 10/29/2015   Procedure: Intravascular Pressure Wire/FFR Study;  Surgeon: Yvonne Kendall, MD;  Location[facility] INVASIVE CV LAB;  Service: Cardiovascular;  Laterality: N/A;   CESAREAN SECTION     ENDOVASCULAR STENT INSERTION N/A 01/22/2016   Procedure: ABDOMINAL AORTIC ENDOVASCULAR STENT GRAFT INSERTION;  Surgeon: Larina Earthly, MD;  Location: [facility] OR;  Service: Vascular;  Laterality: N/A;   HEART STENT  04-2010  and  Jun 07, 2013   X 3   LEFT HEART CATH AND CORONARY ANGIOGRAPHY N/A 04/03/2019   Procedure: LEFT HEART CATH AND CORONARY ANGIOGRAPHY;  Surgeon: Yvonne Kendall, MD;  Location: [facility] INVASIVE CV LAB;  Service: Cardiovascular;  Laterality: N/A;   LEFT HEART CATHETERIZATION WITH CORONARY ANGIOGRAM N/A 06/07/2013   Procedure: LEFT HEART CATHETERIZATION WITH  CORONARY ANGIOGRAM;  Surgeon: Kathleene Hazel, MD;  Location[facility] CATH LAB;  Service: Cardiovascular;  Laterality: N/A;   LEFT HEART CATHETERIZATION WITH CORONARY ANGIOGRAM N/A 02/25/2014   Procedure: LEFT HEART CATHETERIZATION WITH CORONARY ANGIOGRAM;  Surgeon: Lennette Bihari, MD;  Location: [facility] CATH LAB;  Service: Cardiovascular;  Laterality: N/A;   LUMBAR FUSION  01/2007   DR. Noel Gerold...3-LEVEL WITH FIXATION   OOPHORECTOMY     BSO? pt.unsure   PARATHYROIDECTOMY     SPINE SURGERY     THYROIDECTOMY     TOTAL ABDOMINAL HYSTERECTOMY     Family History  Problem Relation Age of Onset   Heart attack Mother 56       s/p D&C-CARDIAC ARREST 1966   Heart disease Mother    Heart attack Father 16       65 WITH MI   Heart disease Father    Diabetes Brother    Diabetes Maternal Aunt    Arthritis Maternal Aunt    Breast cancer Maternal Aunt        Post menopausal   Colon cancer Neg Hx    Social History   Socioeconomic History    Marital status: Divorced    Spouse name: Not on file   Number of children: 3   Years of education: 13   Highest education level: Not on file  Occupational History   Occupation: RETIRED    Employer: [employer]    Comment: ADMIN SUPPORT    Employer: RETIRED  Tobacco Use   Smoking status: Former    Current packs/day: 0.00    Types: Cigarettes    Start date: 05/30/1956    Quit date: 05/31/1986    Years since quitting: 36.8   Smokeless tobacco: Never  Vaping Use   Vaping status: Never Used  Substance and Sexual Activity   Alcohol use: No   Drug use: No   Sexual activity: Not Currently    Comment: 1st intercourse 78 yo-Fewer than 5 partners  Other Topics Concern   Not on file  Social History Narrative   DIVORCED   3 CHILDREN   PATIENT SIGNED A DESIGNATED PARTY RELEASE TO ALLOW HER DAUGHTER, TRAMAINE Nelligan, TO HAVE ACCESS TO HER MEDICAL RECORDS/INFORMATION. Daphane Shepherd, May 04, 2009 @ 3:27 PM   Right handed   One story home      Lives alone.2025   .   Social Drivers of Corporate investment banker Strain: Low Risk  (04/05/2022)   Overall Financial Resource Strain (CARDIA)    Difficulty of Paying Living Expenses: Not hard at all  Food Insecurity: No Food Insecurity (03/22/2023)   Hunger Vital Sign    Worried About Running Out of Food in the Last Year: Never true    Ran Out of Food in the Last Year: Never true  Transportation Needs: No Transportation Needs (03/22/2023)   PRAPARE - Administrator, Civil Service (Medical): No    Lack of Transportation (Non-Medical): No  Physical Activity: Inactive (04/05/2022)   Exercise Vital Sign    Days of Exercise per Week: 0 days    Minutes of Exercise per Session: 0 min  Stress: No Stress Concern Present (04/05/2022)   [employer] of Occupational Health - Occupational Stress Questionnaire    Feeling of Stress : Not at all  Social Connections: Socially Isolated (03/22/2023)   Social Connection and Isolation Panel  [NHANES]    Frequency of Communication with Friends and Family: Twice a week  Frequency of Social Gatherings with Friends and Family: Once a week    Attends Religious Services: Never    Database administrator or Organizations: No    Attends Engineer, structural: Never    Marital Status: Divorced    Tobacco Counseling Counseling given: Not Answered    Clinical Intake:  Pre-visit preparation completed: Yes  Pain : No/denies pain     BMI - recorded: 26.31 Nutritional Status: BMI 25 -29 Overweight Nutritional Risks: None Diabetes: Yes CBG done?: No Did pt. bring in CBG monitor from home?: No  Lab Results  Component Value Date   HGBA1C 5.5 02/21/2023   HGBA1C 5.3 08/04/2022   HGBA1C 5.6 02/10/2022     How often do you need to have someone help you when you read instructions, pamphlets, or other written materials from your doctor or pharmacy?: 1 - Never  Interpreter Needed?: No  Information entered by :: Rashia Mckesson, RMA   Activities of Daily Living     04/11/2023    1:20 PM 03/22/2023    1:00 PM  In your present state of health, do you have any difficulty performing the following activities:  Hearing? 0 0  Vision? 0 0  Difficulty concentrating or making decisions? 0 0  Walking or climbing stairs? 0   Dressing or bathing? 0   Doing errands, shopping? 0 0  Preparing Food and eating ? N   Using the Toilet? N   In the past six months, have you accidently leaked urine? N   Do you have problems with loss of bowel control? N   Managing your Medications? N   Managing your Finances? N   Housekeeping or managing your Housekeeping? N     Patient Care Team: Corwin Levins, MD as PCP - Jerelene Redden, MD as PCP - Cardiology (Cardiology) Glendale Chard, DO as Consulting Physician (Neurology) Kennon Rounds as Physician Assistant (Cardiology) [place], [facility] Rodolph Bong, MD as Consulting Physician (Family Medicine) Lytle Butte,  RN as Oncology Nurse Navigator  Indicate any recent Medical Services you may have received from other than [facility] providers in the past year (date may be approximate).     Assessment:   This is a routine wellness examination for [name].  Hearing/Vision screen Hearing Screening - Comments:: Denies hearing difficulties   Vision Screening - Comments:: Wears eyeglasses for reading   Goals Addressed             This Visit's Progress    Prevent falls   On track      Depression Screen     04/10/2023    3:15 PM 02/21/2023    1:24 PM 08/04/2022    1:41 PM 06/09/2022    2:16 PM 06/03/2022    2:57 PM 04/05/2022    2:01 PM 02/10/2022    2:47 PM  PHQ 2/9 Scores  PHQ - 2 Score 0 0 0 0 0 0   PHQ- 9 Score   3 0     Exception Documentation       Patient refusal    Fall Risk     04/11/2023    1:22 PM 04/10/2023    3:37 PM 02/21/2023    1:42 PM 10/27/2022    2:28 PM 08/04/2022    1:41 PM  Fall Risk   Falls in the past year? 1 0 1 0 0  Number falls in past yr: 0 0 0 0 0  Injury with  Fall? 0 0 0 0 0  Risk for fall due to : No Fall Risks No Fall Risks History of fall(s) No Fall Risks No Fall Risks  Follow up Falls prevention discussed;Falls evaluation completed Falls evaluation completed Falls evaluation completed Falls evaluation completed Falls evaluation completed    MEDICARE RISK AT HOME:  Medicare Risk at Home Any stairs in or around the home?: No Home free of loose throw rugs in walkways, pet beds, electrical cords, etc?: Yes Adequate lighting in your home to reduce risk of falls?: Yes Life alert?: No Use of a cane, walker or w/c?: Yes (a cane) Grab bars in the bathroom?: No Shower chair or bench in shower?: No Elevated toilet seat or a handicapped toilet?: No  TIMED UP AND GO:  Was the test performed?  No  Cognitive Function: Normal: Normal cognitive status assessed by direct observation by this Clinical Health Advisor. No abnormalities found. Patient is able to answer  questions in an accurate and timely manner.        04/05/2022    2:05 PM  6CIT Screen  What Year? 0 points  What month? 0 points  What time? 0 points  Count back from 20 0 points  Months in reverse 0 points  Repeat phrase 0 points  Total Score 0 points    Immunizations Immunization History  Administered Date(s) Administered   Fluad Quad(high Dose 65+) 11/15/2019, 09/17/2020, 10/11/2021   Fluad Trivalent(High Dose 65+) 10/27/2022   Influenza Split 11/02/2010, 10/20/2011   Influenza Whole 11/23/2005, 10/15/2007, 10/31/2008, 11/11/2009   Influenza, High Dose Seasonal PF 09/23/2016, 10/19/2017   Influenza,inj,Quad PF,6+ Mos 10/18/2012, 09/05/2013, 10/30/2015   Influenza-Unspecified 10/18/2014, 09/23/2016, 09/11/2018   PFIZER(Purple Top)SARS-COV-2 Vaccination 03/02/2019, 03/27/2019, 11/05/2019, 10/19/2020   Pneumococcal Conjugate-13 10/18/2012, 02/21/2013   Pneumococcal Polysaccharide-23 11/17/2005, 11/09/2010   Pneumococcal-Unspecified 11/17/2005, 11/09/2010, 10/18/2012, 02/21/2013   Tetanus 02/21/2013   Zoster Recombinant(Shingrix) 09/12/2018, 11/16/2018   Zoster, Live 11/23/2005    Screening Tests Health Maintenance  Topic Date Due   DTaP/Tdap/Td (1 - Tdap) 02/22/2013   Medicare Annual Wellness (AWV)  04/05/2023   OPHTHALMOLOGY EXAM  08/15/2023   HEMOGLOBIN A1C  08/21/2023   Diabetic kidney evaluation - Urine ACR  02/21/2024   FOOT EXAM  02/21/2024   Diabetic kidney evaluation - eGFR measurement  04/09/2024   Pneumonia Vaccine 55+ Years old  Completed   INFLUENZA VACCINE  Completed   DEXA SCAN  Completed   Hepatitis C Screening  Completed   Zoster Vaccines- Shingrix  Completed   HPV VACCINES  Aged Out   Colonoscopy  Discontinued   COVID-19 Vaccine  Discontinued    Health Maintenance  Health Maintenance Due  Topic Date Due   DTaP/Tdap/Td (1 - Tdap) 02/22/2013   Medicare Annual Wellness (AWV)  04/05/2023   Health Maintenance Items Addressed: DEXA ordered, See  Nurse Notes  Additional Screening:  Vision Screening: Recommended annual ophthalmology exams for early detection of glaucoma and other disorders of the eye.  Dental Screening: Recommended annual dental exams for proper oral hygiene  Community Resource Referral / Chronic Care Management: CRR required this visit?  No   CCM required this visit?  No     Plan:     I have personally reviewed and noted the following in the patient's chart:   Medical and social history Use of alcohol, tobacco or illicit drugs  Current medications and supplements including opioid prescriptions. Patient is not currently taking opioid prescriptions. Functional ability and status Nutritional status Physical activity Advanced  directives List of other physicians Hospitalizations, surgeries, and ER visits in previous 12 months Vitals Screenings to include cognitive, depression, and falls Referrals and appointments  In addition, I have reviewed and discussed with patient certain preventive protocols, quality metrics, and best practice recommendations. A written personalized care plan for preventive services as well as general preventive health recommendations were provided to patient.     Deloria Brassfield L Nathanie Ottley, CMA   04/11/2023   After Visit Summary: (MyChart) Due to this being a telephonic visit, the after visit summary with patients personalized plan was offered to patient via MyChart   Notes: Please refer to Routing Comments.

## 2023-04-11 NOTE — Telephone Encounter (Signed)
 3/25 @ 9:46 am Called both patient and patient's daughter contact numbers and Left voicemail for patient to call our office to be schedule for consult.

## 2023-04-12 ENCOUNTER — Telehealth: Payer: Self-pay | Admitting: Radiation Oncology

## 2023-04-12 NOTE — Telephone Encounter (Signed)
 3/26 @ 9:37 am Called both patient contact numbers left voicemail for patient to call our office to be schedule for consult.  Waiting on call back.

## 2023-04-12 NOTE — Progress Notes (Signed)
 Location of tumor and Histology per Pathology Report:    Biopsy: ***  Past/Anticipated interventions by surgeon, if any: {t:21944} *** Squamous cell lung cancer present. She is having significant pleural pain. Needs referral ASAP. I will send a referral to the cancer center now.   Electronically signed by Leslye Peer, MD at 04/03/2023  9:40 AM  Past/Anticipated interventions by medical oncology, if any:   Lajuana Matte April 10, 2023, 12:11 PM  Pain issues, if any:  {:18581} {PAIN DESCRIPTION:21022940}  SAFETY ISSUES: Prior radiation? {:18581} Pacemaker/ICD? {:18581} Possible current pregnancy? no Is the patient on methotrexate? {:18581}  Current Complaints / other details:  ***

## 2023-04-13 ENCOUNTER — Encounter: Payer: Self-pay | Admitting: Radiation Oncology

## 2023-04-13 ENCOUNTER — Ambulatory Visit
Admission: RE | Admit: 2023-04-13 | Discharge: 2023-04-13 | Disposition: A | Discharge status: Home / Self Care | Source: Ambulatory Visit | Attending: Radiation Oncology | Admitting: Radiation Oncology

## 2023-04-13 VITALS — BP 108/69 | HR 82 | Temp 97.6°F | Resp 20 | Ht 66.0 in | Wt 161.0 lb

## 2023-04-13 DIAGNOSIS — D509 Iron deficiency anemia, unspecified: Secondary | ICD-10-CM | POA: Insufficient documentation

## 2023-04-13 DIAGNOSIS — K5730 Diverticulosis of large intestine without perforation or abscess without bleeding: Secondary | ICD-10-CM | POA: Insufficient documentation

## 2023-04-13 DIAGNOSIS — Z79899 Other long term (current) drug therapy: Secondary | ICD-10-CM | POA: Insufficient documentation

## 2023-04-13 DIAGNOSIS — Z981 Arthrodesis status: Secondary | ICD-10-CM | POA: Diagnosis not present

## 2023-04-13 DIAGNOSIS — I700 Atherosclerosis of aorta: Secondary | ICD-10-CM | POA: Insufficient documentation

## 2023-04-13 DIAGNOSIS — I872 Venous insufficiency (chronic) (peripheral): Secondary | ICD-10-CM | POA: Insufficient documentation

## 2023-04-13 DIAGNOSIS — Z7982 Long term (current) use of aspirin: Secondary | ICD-10-CM | POA: Diagnosis not present

## 2023-04-13 DIAGNOSIS — Z803 Family history of malignant neoplasm of breast: Secondary | ICD-10-CM | POA: Diagnosis not present

## 2023-04-13 DIAGNOSIS — C3411 Malignant neoplasm of upper lobe, right bronchus or lung: Secondary | ICD-10-CM | POA: Insufficient documentation

## 2023-04-13 DIAGNOSIS — E669 Obesity, unspecified: Secondary | ICD-10-CM | POA: Insufficient documentation

## 2023-04-13 DIAGNOSIS — I2510 Atherosclerotic heart disease of native coronary artery without angina pectoris: Secondary | ICD-10-CM | POA: Diagnosis not present

## 2023-04-13 DIAGNOSIS — I081 Rheumatic disorders of both mitral and tricuspid valves: Secondary | ICD-10-CM | POA: Insufficient documentation

## 2023-04-13 DIAGNOSIS — M129 Arthropathy, unspecified: Secondary | ICD-10-CM | POA: Diagnosis not present

## 2023-04-13 DIAGNOSIS — I10 Essential (primary) hypertension: Secondary | ICD-10-CM | POA: Diagnosis not present

## 2023-04-13 DIAGNOSIS — Z87891 Personal history of nicotine dependence: Secondary | ICD-10-CM | POA: Insufficient documentation

## 2023-04-13 DIAGNOSIS — E119 Type 2 diabetes mellitus without complications: Secondary | ICD-10-CM | POA: Diagnosis not present

## 2023-04-13 DIAGNOSIS — I714 Abdominal aortic aneurysm, without rupture, unspecified: Secondary | ICD-10-CM | POA: Diagnosis not present

## 2023-04-13 DIAGNOSIS — Z79890 Hormone replacement therapy (postmenopausal): Secondary | ICD-10-CM | POA: Diagnosis not present

## 2023-04-13 DIAGNOSIS — E039 Hypothyroidism, unspecified: Secondary | ICD-10-CM | POA: Insufficient documentation

## 2023-04-13 DIAGNOSIS — E785 Hyperlipidemia, unspecified: Secondary | ICD-10-CM | POA: Insufficient documentation

## 2023-04-13 NOTE — Progress Notes (Signed)
 Radiation Oncology         [phone] ________________________________  Initial Outpatient Consultation  Name: [name] MRN: [id number]  Date: 04/13/2023  DOB: 1945-08-15  ZD:GLOV, Len Blalock, MD  Si Gaul, MD   REFERRING PHYSICIAN: Si Gaul, MD  DIAGNOSIS: The encounter diagnosis was Malignant neoplasm of right upper lobe of lung [facility]).    Stage IIIB (cT4, cN2b, cM0) squamous cell carcinoma, NSCLC, of the right lung; pending full staging workup.  HISTORY OF PRESENT ILLNESS::[name] is a 78 y.o. female who is seen as a courtesy of Dr. Arbutus Ped for an opinion concerning radiation therapy as part of management for her recently diagnosed lung cancer.   The patient presented to her PCP in late February 2025 with complaints of severe right-sided chest pain, which began after a fall earlier that month. She underwent a chest X-ray ordered by her PCP which revealed a right upper lobe opacity, initially thought to be an infection. A subsequent CT scan on March 20, 2023, showed an 8.2 x 2.6 x 7.1 cm right upper lobe mass close to the chest wall, possibly filled with fluid-necrotic malignancy or infectious process with empyema, with suspicious lymph nodes in the mediastinum.    Patient then presented to the ED on 03/21/23 with further complains of right-sided chest pain. She was admitted for further management of chest pain and lung mass workup. While inpatient she underwent a chest x-ray on 03/21/22 which showed no evidence of pleural effusion or pneumothorax and indicated that heart size and mediastinal contours are within normal limits.   Subsequently, she underwent a CT guided needle core biopsy of the right lung mass on 03/27/23 which revealed squamous cell carcinoma. Immunohistochemical stains were positive for p40 and CK5/6 and negative for CK7, TTF-1, D2-40, calretinin, WT1 and MOC-31.   In light of these findings, she was referred to Dr. Arbutus Ped on 04-10-23 during which, she  agreed to undergo a PET scan and MRI of the brain to assess for metastasis. These are both scheduled to be completed on 04/21/2023. At that time, further treatment plans were also discussed including radiation therapy and palliative care for pain management.    Patient notes 10 out of 10 pain at her right side, beneath her rib cage.  She takes Norco as needed which brings her pain to a 3 out of 10.  She describes the pain as sharp and stabbing, but cannot distinguish if it is deep or more superficial.  She denies any shortness of breath, cough, chest pain, or any other issues with her breathing.  PREVIOUS RADIATION THERAPY: No  PAST MEDICAL HISTORY:  Past Medical History:  Diagnosis Date   AAA (abdominal aortic aneurysm) (HCC)    a. s/p stent graft repair 01/2016.   Acute ischemic colitis (HCC) 07/29/2010   Diagnosed June, 2012 characterized by acute lower GI bleeding, spontaneously resolved.    Acute respiratory infection 06/09/2014   Acute sinus infection 05/14/2015   Allergic rhinitis, cause unspecified    Angioedema of lips 06/03/2014   Anxiety state, unspecified    Arthritis of left hip 04/16/2015   Bilateral hearing loss 07/19/2017   Chronic LBP    Coronary artery disease    a. inferior MI 1998 s/p PCI of RCA. b. stenting of Cx 04/2010. c. DES to prox LAD 05/2013. d. DES x 3 in 10/2015. // Myoview 07/2018:  EF 59, normal perfusion, low risk    Coronary artery disease involving native heart without angina pectoris 07/31/2006  Qualifier: Diagnosis of  By: Debby Bud MD, Rosalyn Gess  ANGIOGRAPHIC DATA:  1. Ventriculography done in the RAO projection reveals a small wall  motion abnormality in the mid inferior wall. Ejection fraction  would be estimated at around 50%.  2. The right coronary artery has some progressive disease of about 60-  80% in the proximal mid segment. The distal vessel appears to be  50-70% narrowing althou   Degeneration of lumbar or lumbosacral intervertebral disc    Depressive  disorder, not elsewhere classified    Diabetes (HCC) 11/03/2006   Qualifier: Diagnosis of  By: Jonny Ruiz MD, Len Blalock    Diabetes mellitus    TYPE II   Frequent urination 05/08/2014   Gout 08/22/2013   History of endovascular stent graft for abdominal aortic aneurysm (AAA) 01/22/2016   Hyperlipidemia    Hypertension    Hypothyroidism    Iron deficiency anemia 06/13/2017   Lower GI bleed 06/2010   Diverticular bleed   Lumbar radiculopathy 05/15/2015   MENOPAUSAL DISORDER 10/29/2007   Qualifier: Diagnosis of  By: Jonny Ruiz MD, Len Blalock    Myalgia 09/25/2013   Noncompliance with medications 02/26/2014   Obesity, unspecified    OTITIS MEDIA, SEROUS, CHRONIC 04/23/2007   Qualifier: Diagnosis of  By: Debby Bud MD, Rosalyn Gess    Thoracic disc disease 11/19/2018   URTICARIA 09/23/2009   Qualifier: Diagnosis of  By: Felicity Coyer MD, Vikki Ports A    Venous insufficiency 07/19/2017    PAST SURGICAL HISTORY: Past Surgical History:  Procedure Laterality Date   CARDIAC CATHETERIZATION     PCI OF BOTH THE CIRCUMFLEX AND LEFT ANTERIOR DESCENDING ARTERY   CARDIAC CATHETERIZATION N/A 10/29/2015   Procedure: Coronary Stent Intervention;  Surgeon: Yvonne Kendall, MD;  Location: [facility] INVASIVE CV LAB;  Service: Cardiovascular;  Laterality: N/A;   CARDIAC CATHETERIZATION N/A 10/29/2015   Procedure: Coronary/Graft Angiography;  Surgeon: Yvonne Kendall, MD;  Location: [facility] INVASIVE CV LAB;  Service: Cardiovascular;  Laterality: N/A;   CARDIAC CATHETERIZATION N/A 10/29/2015   Procedure: Intravascular Pressure Wire/FFR Study;  Surgeon: Yvonne Kendall, MD;  Location: [facility] INVASIVE CV LAB;  Service: Cardiovascular;  Laterality: N/A;   CESAREAN SECTION     ENDOVASCULAR STENT INSERTION N/A 01/22/2016   Procedure: ABDOMINAL AORTIC ENDOVASCULAR STENT GRAFT INSERTION;  Surgeon: Larina Earthly, MD;  Location: [facility] OR;  Service: Vascular;  Laterality: N/A;   HEART STENT  04-2010  and  Jun 07, 2013   X 3   LEFT HEART CATH AND CORONARY ANGIOGRAPHY N/A  04/03/2019   Procedure: LEFT HEART CATH AND CORONARY ANGIOGRAPHY;  Surgeon: Yvonne Kendall, MD;  Location: [facility] INVASIVE CV LAB;  Service: Cardiovascular;  Laterality: N/A;   LEFT HEART CATHETERIZATION WITH CORONARY ANGIOGRAM N/A 06/07/2013   Procedure: LEFT HEART CATHETERIZATION WITH CORONARY ANGIOGRAM;  Surgeon: Kathleene Hazel, MD;  Location: [facility] CATH LAB;  Service: Cardiovascular;  Laterality: N/A;   LEFT HEART CATHETERIZATION WITH CORONARY ANGIOGRAM N/A 02/25/2014   Procedure: LEFT HEART CATHETERIZATION WITH CORONARY ANGIOGRAM;  Surgeon: Lennette Bihari, MD;  Location: [facility] CATH LAB;  Service: Cardiovascular;  Laterality: N/A;   LUMBAR FUSION  01/2007   DR. Noel Gerold...3-LEVEL WITH FIXATION   OOPHORECTOMY     BSO? pt.unsure   PARATHYROIDECTOMY     SPINE SURGERY     THYROIDECTOMY     TOTAL ABDOMINAL HYSTERECTOMY      FAMILY HISTORY:  Family History  Problem Relation Age of Onset   Heart attack Mother 60  s/p D&C-CARDIAC ARREST 1966   Heart disease Mother    Heart attack Father 37       20 WITH MI   Heart disease Father    Diabetes Brother    Diabetes Maternal Aunt    Arthritis Maternal Aunt    Breast cancer Maternal Aunt        Post menopausal   Colon cancer Neg Hx     SOCIAL HISTORY:  Social History   Tobacco Use   Smoking status: Former    Current packs/day: 0.00    Types: Cigarettes    Start date: 05/30/1956    Quit date: 05/31/1986    Years since quitting: 36.8   Smokeless tobacco: Never  Vaping Use   Vaping status: Never Used  Substance Use Topics   Alcohol use: No   Drug use: No    ALLERGIES:  Allergies  Allergen Reactions   Crestor [Rosuvastatin Calcium] Other (See Comments)    Feeling poor   Prilosec [Omeprazole] Other (See Comments)    Chest pain   Miconazole Nitrate Hives    REACTION: hives   Augmentin [Amoxicillin-Pot Clavulanate] Hives, Itching and Rash    Has patient had a PCN reaction causing immediate rash, facial/tongue/throat swelling,  SOB or lightheadedness with hypotension:unsure Has patient had a PCN reaction causing severe rash involving mucus membranes or skin necrosis:unsure Has patient had a PCN reaction that required hospitalization:No Has patient had a PCN reaction occurring within the last 10 years:NO If all of the above answers are "NO", then may proceed with Cephalosporin use. Has patient had a PCN reaction causing immediate rash, facial/tongue/throat swelling, SOB or lightheadedness with hypotension:unsure Has patient had a PCN reaction causing severe rash involving mucus membranes or skin necrosis:unsure Has patient had a PCN reaction that required hospitalization:No Has patient had a PCN reaction occurring within the last 10 years:NO If all of the above answers are "NO", then may proceed with Cephalosporin use.    Doxycycline Other (See Comments)    REACTION: gi upset    MEDICATIONS:  Current Outpatient Medications  Medication Sig Dispense Refill   aspirin 81 MG EC tablet Take 81 mg by mouth daily.      atenolol (TENORMIN) 25 MG tablet TAKE 1 TABLET BY MOUTH EVERY DAY. 90 tablet 1   Foot Care Products ([employer] GEL HEEL CUSHION WOMENS) PADS Use as directed at bedtime 2 each 5   gabapentin (NEURONTIN) 300 MG capsule Take 1-2 tab by mouth at bedtime for pain and sleep (Patient taking differently: Take 300 mg by mouth as needed. Take 1-2 tab by mouth at bedtime for pain and sleep) 180 capsule 1   HYDROcodone-acetaminophen (NORCO) 10-325 MG tablet Take 1 tablet by mouth every 8 (eight) hours as needed. 60 tablet 0   isosorbide mononitrate (IMDUR) 30 MG 24 hr tablet Take 0.5 tablets (15 mg total) by mouth daily. 30 tablet 0   levofloxacin (LEVAQUIN) 500 MG tablet Take 1 tablet (500 mg total) by mouth daily. 10 tablet 0   levothyroxine (SYNTHROID) 100 MCG tablet Take 1 tablet (100 mcg total) by mouth daily before breakfast. 30 tablet 0   temazepam (RESTORIL) 15 MG capsule Take 1 capsule (15 mg total) by mouth at  bedtime as needed for sleep. 30 capsule 2   Triamcinolone Acetonide (ZILRETTA) 32 MG SRER intra-articular injection 5 mL, intra-articular, bilat knees 2 each 0   atorvastatin (LIPITOR) 40 MG tablet Take 1 tablet (40 mg total) by mouth daily. (Patient not taking: Reported  on 03/21/2023) 90 tablet 3   fluconazole (DIFLUCAN) 150 MG tablet Take one tab by mouth every 3 days as needed (Patient not taking: Reported on 04/13/2023) 2 tablet 1   No current facility-administered medications for this encounter.    REVIEW OF SYSTEMS: Notable for that above.   PHYSICAL EXAM:  height is 5\' 6"  (1.676 m) and weight is 161 lb (73 kg). Her temperature is 97.6 F (36.4 C). Her blood pressure is 108/69 and her pulse is 82. Her respiration is 20 and oxygen saturation is 100%.   General: Alert and oriented, in no acute distress HEENT: Head is normocephalic. Extraocular movements are intact. Oropharynx is clear. Neck: Neck is supple, no palpable cervical or supraclavicular lymphadenopathy. Heart: Regular in rate and rhythm with no murmurs, rubs, or gallops. Chest: Clear to auscultation bilaterally, with no rhonchi, wheezes, or rales.  She reports tenderness with palpation along the right lateral lower chest region and inferior to the rib cage on the same side. Abdomen: Soft, nontender, nondistended, with no rigidity or guarding. Extremities: No cyanosis or edema. Lymphatics: see Neck Exam Skin: No concerning lesions. Musculoskeletal: symmetric strength and muscle tone throughout.  Exquisite tenderness to the right side, beneath the rib cage along the mid axillary line. Neurologic: Cranial nerves II through XII are grossly intact. No obvious focalities. Speech is fluent. Coordination is intact. Psychiatric: Judgment and insight are intact. Affect is appropriate.   ECOG = 1  0 - Asymptomatic (Fully active, able to carry on all predisease activities without restriction)  1 - Symptomatic but completely ambulatory  (Restricted in physically strenuous activity but ambulatory and able to carry out work of a light or sedentary nature. For example, light housework, office work)  2 - Symptomatic, <50% in bed during the day (Ambulatory and capable of all self care but unable to carry out any work activities. Up and about more than 50% of waking hours)  3 - Symptomatic, >50% in bed, but not bedbound (Capable of only limited self-care, confined to bed or chair 50% or more of waking hours)  4 - Bedbound (Completely disabled. Cannot carry on any self-care. Totally confined to bed or chair)  5 - Death   Santiago Glad MM, Creech RH, Tormey DC, et al. [phone]). "Toxicity and response criteria of the [facility]". Am. Evlyn Clines. Oncol. 5 (6): 649-55  LABORATORY DATA:  Lab Results  Component Value Date   WBC 9.9 04/10/2023   HGB 13.8 04/10/2023   HCT 43.4 04/10/2023   MCV 84.8 04/10/2023   PLT 407 (H) 04/10/2023   NEUTROABS 6.8 04/10/2023   Lab Results  Component Value Date   NA 136 04/10/2023   K 4.2 04/10/2023   CL 99 04/10/2023   CO2 29 04/10/2023   GLUCOSE 100 (H) 04/10/2023   BUN 7 (L) 04/10/2023   CREATININE 0.88 04/10/2023   CALCIUM 9.1 04/10/2023      RADIOGRAPHY: DG Chest Port 1 View Result Date: 03/27/2023 CLINICAL DATA:  [id number] Post-operative state [id number] EXAM: PORTABLE CHEST 1 VIEW COMPARISON:  Chest XR, 03/21/2023. CT chest, 03/20/2023. IR CT, earlier same day. FINDINGS: Cardiac silhouette is within normal limits. Lungs are hypoinflated. The LEFT lung is clear. Similar degree of RIGHT upper chest no pleural thickening, with associated pulmonary opacity and post procedural change. No pleural effusion or pneumothorax. Multilevel thoracic spine fusion. No acute osseous abnormality. IMPRESSION: Post biopsy changes at the RIGHT upper chest.  No pneumothorax. Electronically Signed   By: Roanna Banning  M.D.   On: 03/27/2023 11:32   CT LUNG MASS BIOPSY Result Date: 03/27/2023 INDICATION:  RIGHT pleural lung.  No diagnosis. EXAM: CT-GUIDED RIGHT PLEURAL MASS BIOPSY COMPARISON:  CT CHEST, 03/20/2023 MEDICATIONS: None. ANESTHESIA/SEDATION: Moderate (conscious) sedation was employed during this procedure. A total of Versed 2 mg and Fentanyl 75 mcg was administered intravenously. Moderate Sedation Time: 23 minutes. The patient's level of consciousness and vital signs were monitored continuously by radiology nursing throughout the procedure under my direct supervision. CONTRAST:  None FLUOROSCOPY TIME:  CT dose; 536 mGycm COMPLICATIONS: None immediate. PROCEDURE: RADIATION DOSE REDUCTION: This exam was performed according to the departmental dose-optimization program which includes automated exposure control, adjustment of the mA and/or kV according to patient size and/or use of iterative reconstruction technique. Informed consent was obtained from the patient following an explanation of the procedure, risks, benefits and alternatives. The patient understands,agrees and consents for the procedure. All questions were addressed. A time out was performed prior to the initiation of the procedure. The patient was positioned supine on the CT table and a limited chest CT was performed for procedural planning demonstrating RIGHT pleural mass. The operative site was prepped and draped in the usual sterile fashion. Under sterile conditions and local anesthesia, a 17 gauge coaxial needle was advanced into the peripheral aspect of the nodule. Positioning was confirmed with intermittent CT fluoroscopy and followed by the acquisition of RIGHT pleural mass with an 18 gauge core needle biopsy device. The coaxial needle was removed following deployment of hemostatic patch and superficial hemostasis was achieved with manual compression. Limited post procedural chest CT was negative for pneumothorax or additional complication. A dressing was placed. The patient tolerated the procedure well without immediate postprocedural  complication. The patient was escorted to have an upright chest radiograph. IMPRESSION: Successful CT guided core needle core biopsy of RIGHT pleural mass. Roanna Banning, MD [facility] Electronically Signed   By: Roanna Banning M.D.   On: 03/27/2023 10:59   ECHOCARDIOGRAM COMPLETE Result Date: 03/22/2023    ECHOCARDIOGRAM REPORT   Patient Name:   [name] Date of Exam: 03/22/2023 Medical Rec #:  [id number]      Height:       66.0 in Accession #:    [id number]     Weight:       176.0 lb Date of Birth:  03-02-1945       BSA:          1.894 m Patient Age:    77 years       BP:           134/64 mmHg Patient Gender: F              HR:           68 bpm. Exam Location:  Inpatient Procedure: 2D Echo, Cardiac Doppler and Color Doppler (Both Spectral and Color            Flow Doppler were utilized during procedure). Indications:    Chest Pain  History:        Patient has prior history of Echocardiogram examinations, most                 recent 02/03/2021. CAD; AAA.  Sonographer:    Amy Chionchio Referring Phys: Midge Minium, N IMPRESSIONS  1. Abnormal septal motion Strain and 3D EF not performed. Left ventricular ejection fraction, by estimation, is 50 to 55%. The left ventricle has low normal  function. The left ventricle has no regional wall motion abnormalities. Left ventricular diastolic parameters were normal.  2. Right ventricular systolic function is normal. The right ventricular size is normal.  3. The mitral valve is abnormal. Trivial mitral valve regurgitation. No evidence of mitral stenosis.  4. The aortic valve is tricuspid. There is moderate calcification of the aortic valve. There is moderate thickening of the aortic valve. Aortic valve regurgitation is not visualized. Aortic valve sclerosis/calcification is present, without any evidence of aortic stenosis.  5. The inferior vena cava is normal in size with greater than 50% respiratory variability,  suggesting right atrial pressure of 3 mmHg. FINDINGS  Left Ventricle: Abnormal septal motion Strain and 3D EF not performed. Left ventricular ejection fraction, by estimation, is 50 to 55%. The left ventricle has low normal function. The left ventricle has no regional wall motion abnormalities. Strain was performed and the global longitudinal strain is indeterminate. The left ventricular internal cavity size was normal in size. There is no left ventricular hypertrophy. Left ventricular diastolic parameters were normal. Right Ventricle: The right ventricular size is normal. No increase in right ventricular wall thickness. Right ventricular systolic function is normal. Left Atrium: Left atrial size was normal in size. Right Atrium: Right atrial size was normal in size. Pericardium: There is no evidence of pericardial effusion. Mitral Valve: The mitral valve is abnormal. There is mild thickening of the mitral valve leaflet(s). There is mild calcification of the mitral valve leaflet(s). Mild mitral annular calcification. Trivial mitral valve regurgitation. No evidence of mitral valve stenosis. MV peak gradient, 5.3 mmHg. The mean mitral valve gradient is 2.0 mmHg. Tricuspid Valve: The tricuspid valve is normal in structure. Tricuspid valve regurgitation is mild . No evidence of tricuspid stenosis. Aortic Valve: The aortic valve is tricuspid. There is moderate calcification of the aortic valve. There is moderate thickening of the aortic valve. Aortic valve regurgitation is not visualized. Aortic valve sclerosis/calcification is present, without any  evidence of aortic stenosis. Aortic valve mean gradient measures 4.0 mmHg. Aortic valve peak gradient measures 6.7 mmHg. Aortic valve area, by VTI measures 2.31 cm. Pulmonic Valve: The pulmonic valve was normal in structure. Pulmonic valve regurgitation is not visualized. No evidence of pulmonic stenosis. Aorta: The aortic root is normal in size and structure. Venous: The  inferior vena cava is normal in size with greater than 50% respiratory variability, suggesting right atrial pressure of 3 mmHg. IAS/Shunts: No atrial level shunt detected by color flow Doppler. Additional Comments: 3D was performed not requiring image post processing on an independent workstation and was indeterminate.  LEFT VENTRICLE PLAX 2D LVIDd:         4.90 cm      Diastology LVIDs:         3.10 cm      LV e' medial:    4.57 cm/s LV PW:         0.80 cm      LV E/e' medial:  17.2 LV IVS:        0.80 cm      LV e' lateral:   9.36 cm/s LVOT diam:     2.00 cm      LV E/e' lateral: 8.4 LV SV:         60 LV SV Index:   32 LVOT Area:     3.14 cm  LV Volumes (MOD) LV vol d, MOD A2C: 105.0 ml LV vol d, MOD A4C: 91.8 ml LV vol s, MOD A4C: 42.4  ml LV SV MOD A4C:     91.8 ml RIGHT VENTRICLE            IVC RV Basal diam:  3.50 cm    IVC diam: 1.40 cm RV S prime:     8.05 cm/s TAPSE (M-mode): 1.5 cm LEFT ATRIUM             Index        RIGHT ATRIUM           Index LA Vol (A2C):   54.8 ml 28.93 ml/m  RA Area:     12.70 cm LA Vol (A4C):   67.0 ml 35.37 ml/m  RA Volume:   30.40 ml  16.05 ml/m LA Biplane Vol: 61.6 ml 32.52 ml/m  AORTIC VALVE                    PULMONIC VALVE AV Area (Vmax):    2.31 cm     PV Vmax:       0.62 m/s AV Area (Vmean):   2.36 cm     PV Peak grad:  1.5 mmHg AV Area (VTI):     2.31 cm AV Vmax:           129.00 cm/s AV Vmean:          88.000 cm/s AV VTI:            0.260 m AV Peak Grad:      6.7 mmHg AV Mean Grad:      4.0 mmHg LVOT Vmax:         95.00 cm/s LVOT Vmean:        66.100 cm/s LVOT VTI:          0.191 m LVOT/AV VTI ratio: 0.73  AORTA Ao Root diam: 2.60 cm Ao Asc diam:  2.70 cm MITRAL VALVE MV Area (PHT): 3.10 cm    SHUNTS MV Area VTI:   1.92 cm    Systemic VTI:  0.19 m MV Peak grad:  5.3 mmHg    Systemic Diam: 2.00 cm MV Mean grad:  2.0 mmHg MV Vmax:       1.15 m/s MV Vmean:      69.5 cm/s MV Decel Time: 245 msec MV E velocity: 78.60 cm/s MV A velocity: 89.60 cm/s MV E/A ratio:  0.88  Charlton Haws MD Electronically signed by Charlton Haws MD Signature Date/Time: 03/22/2023/4:35:38 PM    Final    DG Chest 2 View Result Date: 03/21/2023 CLINICAL DATA:  Intermittent chest pain EXAM: CHEST - 2 VIEW COMPARISON:  CT chest dated 03/20/2023, chest radiograph dated 02/21/2023 FINDINGS: Patient is rotated to the right. Normal lung volumes. Superolateral right lung opacity, better evaluated on recent chest CT. Minimal adjacent interstitial opacities. No pleural effusion or pneumothorax. The heart size and mediastinal contours are within normal limits. No acute osseous abnormality. Partially imaged thoracolumbar spinal fixation hardware appears intact. Partially imaged aortic stent graft. IMPRESSION: Superolateral right lung opacity, better evaluated on recent chest CT. No new focal consolidations. Electronically Signed   By: Agustin Cree M.D.   On: 03/21/2023 19:57   CT Chest W Contrast Result Date: 03/20/2023 CLINICAL DATA:  Peripheral right upper lobe opacity which may be cavitary seen on chest radiographs dated 02/21/2023. Chest CT was recommended for further evaluation. EXAM: CT CHEST WITH CONTRAST TECHNIQUE: Multidetector CT imaging of the chest was performed during intravenous contrast administration. RADIATION DOSE REDUCTION: This exam was performed according to the departmental dose-optimization program  which includes automated exposure control, adjustment of the mA and/or kV according to patient size and/or use of iterative reconstruction technique. CONTRAST:  75mL ISOVUE-300 IOPAMIDOL (ISOVUE-300) INJECTION 61% COMPARISON:  Chest radiographs dated 02/21/2023 and 04/03/2019. Chest CTA dated 06/11/2017. FINDINGS: Cardiovascular: Atheromatous calcifications, including the coronary arteries and aorta. Normal-sized heart. No pericardial effusion. Mediastinum/Nodes: Surgically absent left lobe thyroid and isthmus. Normal-appearing right lobe. Mildly enlarged right anterior paratracheal/precarinal node  with a short axis diameter of 14 mm on image number 135/10 without significant change since 06/11/2017. No interval enlarged lymph nodes elsewhere. Unremarkable trachea and esophagus. Lungs/Pleura: Irregular, pleural-based mass laterally in the right upper chest. This contains a minimal air or gas-filled cavitary component and is predominantly low-density centrally, possibly necrotic or filled with fluid. This is difficult to assess due to extensive streak artifact from spinal hardware. This area measures 8.2 x 2.6 cm on image number 89/10. This measures 7.1 cm in length on coronal image number 97/4. No pleural fluid elsewhere on either side. There are patchy, linear and interstitial densities in the right upper lobe adjacent to and inferior to the pleural-based mass. Clear left lung.  No lung nodules seen. Upper Abdomen: Extensive streak artifacts from spine fixation hardware. Multiple small colonic diverticula. Oval area of low density in the left lobe of the liver measuring 4.2 x 3.3 cm on image number 297/10. This measures 19 Hounsfield units in density, including increased density associated with streak artifacts. This measured 2.7 x 2.1 cm and -4 Hounsfield units in density on 02/24/2017, compatible with a simple cyst, not requiring imaging follow-up. Musculoskeletal: Pedicle screw and rod fixation throughout the thoracic and lumbar spine from the upper thoracic spine down with associated extensive streak artifacts. Extensive thoracic and lower cervical spine degenerative changes. Grade 1 anterolisthesis in the lower thoracic spine with pedicle screw tracts compatible with removed screws. IMPRESSION: 1. 8.2 x 2.6 x 7.1 cm irregular, pleural-based mass laterally in the right upper chest with a minimal air or gas-filled cavitary component and predominantly low-density centrally, possibly necrotic or filled with fluid. This is difficult to assess due to extensive streak artifacts from spinal hardware. This could  represent a necrotic malignancy or an infectious process with a empyema. 2. Patchy, linear and interstitial densities in the right upper lobe adjacent to and inferior to the pleural-based mass, likely due to infection. This is more extensive and pronounced than expected with lymphangitic spread of tumor. 3. Stable mildly enlarged right anterior paratracheal/precarinal lymph node since 06/11/2017, compatible with a benign lymph node. 4.  Calcific coronary artery and aortic atherosclerosis. 5. Colonic diverticulosis. Aortic Atherosclerosis (ICD10-I70.0). Electronically Signed   By: Beckie Salts M.D.   On: 03/20/2023 17:37   MM 3D SCREENING MAMMOGRAM BILATERAL BREAST Result Date: 03/17/2023 CLINICAL DATA:  Screening. EXAM: DIGITAL SCREENING BILATERAL MAMMOGRAM WITH TOMOSYNTHESIS AND CAD TECHNIQUE: Bilateral screening digital craniocaudal and mediolateral oblique mammograms were obtained. Bilateral screening digital breast tomosynthesis was performed. The images were evaluated with computer-aided detection. COMPARISON:  Previous exam(s). ACR Breast Density Category a: The breasts are almost entirely fatty. FINDINGS: There are no findings suspicious for malignancy. IMPRESSION: No mammographic evidence of malignancy. A result letter of this screening mammogram will be mailed directly to the patient. RECOMMENDATION: Screening mammogram in one year. (Code:[id number]) BI-RADS CATEGORY  1: Negative. Electronically Signed   By: Frederico Hamman M.D.   On: 03/17/2023 15:17      IMPRESSION:  Stage IIIB (cT4, cN2b, cM0) squamous cell carcinoma, NSCLC, of the right upper  lung; pending full staging workup.  We have reviewed the patient's current workup today.  Imaging demonstrates a large right upper lung mass.  Biopsy confirmed squamous cell carcinoma.  She is exhibiting pain in her right side, which seems to be lower than where the lung mass is located.  Agree with full staging workup to evaluate for metastatic disease.   Dr. Roselind Messier recommends a palliative course of radiation to the right upper lung mass to decrease the tumor size and possibly alleviate the patient's pain.  At the time of simulation we will scan through the abdominal region to determine if there is a additional lesion that may be causing some of her pain.   Today, We talked to the patient and family about the findings and work-up thus far.  We discussed the natural history of non-small cell lung cancer and general treatment, highlighting the role of radiotherapy in the management.  We discussed the available radiation techniques, and focused on the details of logistics and delivery.  We reviewed the anticipated acute and late sequelae associated with radiation in this setting.  The patient was encouraged to ask questions that I answered to the best of my ability.  A patient consent form was discussed and signed.  We retained a copy for our records.  The patient would like to proceed with radiation and will be scheduled for CT simulation.  PLAN: Patient is scheduled for CT simulation on 04/14/2023.  Anticipate 10 fractions of radiation to the right lung mass.  She is scheduled for PET and MRI of the brain on 04/21/2023.  She will follow-up with Dr. Arbutus Ped to review the results and discuss systemic treatment options at that time.   60 minutes of total time was spent for this patient encounter, including preparation, face-to-face counseling with the patient and coordination of care, physical exam, and documentation of the encounter.   ------------------------------------------------   Bryan Lemma, PA-C   Billie Lade, PhD, MD   [facility]  Radiation Oncology Direct Dial: [phone]  Fax: [phone] .com    This document serves as a record of services personally performed by Antony Blackbird, MD and Bryan Lemma, PA-C. It was created on his behalf by Herbie Saxon, a trained medical scribe. The creation of this record is based on the  scribe's personal observations and the provider's statements to them. This document has been checked and approved by the attending provider.

## 2023-04-13 NOTE — Addendum Note (Signed)
 Encounter addended by: Rana Snare, LPN on: 04/25/8117 1:50 PM  Actions taken: Visit diagnoses modified

## 2023-04-14 ENCOUNTER — Ambulatory Visit
Admission: RE | Admit: 2023-04-14 | Discharge: 2023-04-14 | Disposition: A | Discharge status: Home / Self Care | Source: Ambulatory Visit | Attending: Radiation Oncology | Admitting: Radiation Oncology

## 2023-04-14 DIAGNOSIS — C3411 Malignant neoplasm of upper lobe, right bronchus or lung: Secondary | ICD-10-CM | POA: Diagnosis not present

## 2023-04-14 DIAGNOSIS — Z87891 Personal history of nicotine dependence: Secondary | ICD-10-CM | POA: Diagnosis not present

## 2023-04-17 DIAGNOSIS — Z87891 Personal history of nicotine dependence: Secondary | ICD-10-CM | POA: Diagnosis not present

## 2023-04-17 DIAGNOSIS — C3411 Malignant neoplasm of upper lobe, right bronchus or lung: Secondary | ICD-10-CM | POA: Diagnosis not present

## 2023-04-19 ENCOUNTER — Other Ambulatory Visit: Payer: Self-pay

## 2023-04-19 ENCOUNTER — Ambulatory Visit
Admission: RE | Admit: 2023-04-19 | Discharge: 2023-04-19 | Disposition: A | Discharge status: Home / Self Care | Source: Ambulatory Visit | Attending: Radiation Oncology | Admitting: Radiation Oncology

## 2023-04-19 DIAGNOSIS — R300 Dysuria: Secondary | ICD-10-CM | POA: Diagnosis not present

## 2023-04-19 DIAGNOSIS — Z510 Encounter for antineoplastic radiation therapy: Secondary | ICD-10-CM | POA: Diagnosis not present

## 2023-04-19 DIAGNOSIS — Z87891 Personal history of nicotine dependence: Secondary | ICD-10-CM | POA: Diagnosis not present

## 2023-04-19 DIAGNOSIS — C3411 Malignant neoplasm of upper lobe, right bronchus or lung: Secondary | ICD-10-CM | POA: Insufficient documentation

## 2023-04-19 LAB — RAD ONC ARIA SESSION SUMMARY
Course Elapsed Days: 0
Plan Fractions Treated to Date: 1
Plan Prescribed Dose Per Fraction: 3 Gy
Plan Total Fractions Prescribed: 10
Plan Total Prescribed Dose: 30 Gy
Reference Point Dosage Given to Date: 3 Gy
Reference Point Session Dosage Given: 3 Gy
Session Number: 1

## 2023-04-20 ENCOUNTER — Other Ambulatory Visit: Payer: Self-pay

## 2023-04-20 ENCOUNTER — Ambulatory Visit
Admission: RE | Admit: 2023-04-20 | Discharge: 2023-04-20 | Disposition: A | Discharge status: Home / Self Care | Source: Ambulatory Visit | Attending: Radiation Oncology | Admitting: Radiation Oncology

## 2023-04-20 DIAGNOSIS — Z510 Encounter for antineoplastic radiation therapy: Secondary | ICD-10-CM | POA: Diagnosis not present

## 2023-04-20 DIAGNOSIS — C3411 Malignant neoplasm of upper lobe, right bronchus or lung: Secondary | ICD-10-CM | POA: Diagnosis not present

## 2023-04-20 DIAGNOSIS — Z87891 Personal history of nicotine dependence: Secondary | ICD-10-CM | POA: Diagnosis not present

## 2023-04-20 LAB — RAD ONC ARIA SESSION SUMMARY
Course Elapsed Days: 1
Plan Fractions Treated to Date: 2
Plan Prescribed Dose Per Fraction: 3 Gy
Plan Total Fractions Prescribed: 10
Plan Total Prescribed Dose: 30 Gy
Reference Point Dosage Given to Date: 6 Gy
Reference Point Session Dosage Given: 3 Gy
Session Number: 2

## 2023-04-21 ENCOUNTER — Encounter (HOSPITAL_COMMUNITY)
Admission: RE | Admit: 2023-04-21 | Discharge: 2023-04-21 | Disposition: A | Discharge status: Home / Self Care | Source: Ambulatory Visit | Attending: Internal Medicine | Admitting: Internal Medicine

## 2023-04-21 ENCOUNTER — Ambulatory Visit (HOSPITAL_COMMUNITY)
Admission: RE | Admit: 2023-04-21 | Discharge: 2023-04-21 | Disposition: A | Discharge status: Home / Self Care | Source: Ambulatory Visit | Attending: Internal Medicine | Admitting: Internal Medicine

## 2023-04-21 ENCOUNTER — Ambulatory Visit
Admission: RE | Admit: 2023-04-21 | Discharge: 2023-04-21 | Disposition: A | Discharge status: Home / Self Care | Source: Ambulatory Visit | Attending: Radiation Oncology | Admitting: Radiation Oncology

## 2023-04-21 ENCOUNTER — Other Ambulatory Visit: Payer: Self-pay

## 2023-04-21 ENCOUNTER — Telehealth: Payer: Self-pay

## 2023-04-21 DIAGNOSIS — G319 Degenerative disease of nervous system, unspecified: Secondary | ICD-10-CM | POA: Diagnosis not present

## 2023-04-21 DIAGNOSIS — Z510 Encounter for antineoplastic radiation therapy: Secondary | ICD-10-CM | POA: Diagnosis not present

## 2023-04-21 DIAGNOSIS — C3490 Malignant neoplasm of unspecified part of unspecified bronchus or lung: Secondary | ICD-10-CM | POA: Diagnosis not present

## 2023-04-21 DIAGNOSIS — E034 Atrophy of thyroid (acquired): Secondary | ICD-10-CM | POA: Diagnosis not present

## 2023-04-21 DIAGNOSIS — K5730 Diverticulosis of large intestine without perforation or abscess without bleeding: Secondary | ICD-10-CM | POA: Diagnosis not present

## 2023-04-21 DIAGNOSIS — R300 Dysuria: Secondary | ICD-10-CM | POA: Diagnosis not present

## 2023-04-21 DIAGNOSIS — Z87891 Personal history of nicotine dependence: Secondary | ICD-10-CM | POA: Diagnosis not present

## 2023-04-21 DIAGNOSIS — R590 Localized enlarged lymph nodes: Secondary | ICD-10-CM | POA: Diagnosis not present

## 2023-04-21 DIAGNOSIS — I6782 Cerebral ischemia: Secondary | ICD-10-CM | POA: Diagnosis not present

## 2023-04-21 DIAGNOSIS — I2510 Atherosclerotic heart disease of native coronary artery without angina pectoris: Secondary | ICD-10-CM | POA: Diagnosis not present

## 2023-04-21 DIAGNOSIS — C3411 Malignant neoplasm of upper lobe, right bronchus or lung: Secondary | ICD-10-CM | POA: Diagnosis not present

## 2023-04-21 LAB — RAD ONC ARIA SESSION SUMMARY
Course Elapsed Days: 2
Plan Fractions Treated to Date: 3
Plan Prescribed Dose Per Fraction: 3 Gy
Plan Total Fractions Prescribed: 10
Plan Total Prescribed Dose: 30 Gy
Reference Point Dosage Given to Date: 9 Gy
Reference Point Session Dosage Given: 3 Gy
Session Number: 3

## 2023-04-21 LAB — GLUCOSE, CAPILLARY: Glucose-Capillary: 88 mg/dL (ref 70–99)

## 2023-04-21 LAB — MOLECULAR PATHOLOGY

## 2023-04-21 MED ORDER — GADOBUTROL 1 MMOL/ML IV SOLN
7.0000 mL | Freq: Once | INTRAVENOUS | Status: AC | PRN
  Administered 2023-04-21: 7 mL via INTRAVENOUS

## 2023-04-21 MED ORDER — FLUDEOXYGLUCOSE F - 18 (FDG) INJECTION
8.0000 | Freq: Once | INTRAVENOUS | Status: AC | PRN
  Administered 2023-04-21: 8 via INTRAVENOUS

## 2023-04-21 NOTE — Telephone Encounter (Addendum)
 Spoke with patients daughter Leonard Schwartz this morning and she reports her mom has a brain MRI and PET scan but she was not aware of location and time. Informed daughter of time and location.  Daughter verbalized understanding.

## 2023-04-22 ENCOUNTER — Emergency Department (HOSPITAL_COMMUNITY)
Admission: EM | Admit: 2023-04-22 | Discharge: 2023-04-22 | Disposition: A | Discharge status: Home / Self Care | Attending: Emergency Medicine | Admitting: Emergency Medicine

## 2023-04-22 ENCOUNTER — Other Ambulatory Visit: Payer: Self-pay

## 2023-04-22 ENCOUNTER — Encounter (HOSPITAL_COMMUNITY): Payer: Self-pay

## 2023-04-22 ENCOUNTER — Emergency Department (HOSPITAL_COMMUNITY)

## 2023-04-22 DIAGNOSIS — Z7982 Long term (current) use of aspirin: Secondary | ICD-10-CM | POA: Insufficient documentation

## 2023-04-22 DIAGNOSIS — I959 Hypotension, unspecified: Secondary | ICD-10-CM | POA: Diagnosis not present

## 2023-04-22 DIAGNOSIS — R0789 Other chest pain: Secondary | ICD-10-CM | POA: Insufficient documentation

## 2023-04-22 DIAGNOSIS — J929 Pleural plaque without asbestos: Secondary | ICD-10-CM | POA: Diagnosis not present

## 2023-04-22 DIAGNOSIS — Z981 Arthrodesis status: Secondary | ICD-10-CM | POA: Diagnosis not present

## 2023-04-22 DIAGNOSIS — C3490 Malignant neoplasm of unspecified part of unspecified bronchus or lung: Secondary | ICD-10-CM | POA: Diagnosis not present

## 2023-04-22 DIAGNOSIS — Z955 Presence of coronary angioplasty implant and graft: Secondary | ICD-10-CM | POA: Diagnosis not present

## 2023-04-22 DIAGNOSIS — R079 Chest pain, unspecified: Secondary | ICD-10-CM | POA: Diagnosis present

## 2023-04-22 LAB — COMPREHENSIVE METABOLIC PANEL WITH GFR
ALT: 8 U/L (ref 0–44)
AST: 14 U/L — ABNORMAL LOW (ref 15–41)
Albumin: 2.2 g/dL — ABNORMAL LOW (ref 3.5–5.0)
Alkaline Phosphatase: 72 U/L (ref 38–126)
Anion gap: 10 (ref 5–15)
BUN: <5 mg/dL — ABNORMAL LOW (ref 8–23)
CO2: 26 mmol/L (ref 22–32)
Calcium: 7.6 mg/dL — ABNORMAL LOW (ref 8.9–10.3)
Chloride: 103 mmol/L (ref 98–111)
Creatinine, Ser: 0.68 mg/dL (ref 0.44–1.00)
GFR, Estimated: >60 mL/min (ref >60)
Glucose, Bld: 87 mg/dL (ref 70–99)
Potassium: 2.9 mmol/L — ABNORMAL LOW (ref 3.5–5.1)
Sodium: 139 mmol/L (ref 135–145)
Total Bilirubin: 1 mg/dL (ref 0.0–1.2)
Total Protein: 6.9 g/dL (ref 6.5–8.1)

## 2023-04-22 LAB — CBC WITH DIFFERENTIAL/PLATELET
Abs Immature Granulocytes: 0.04 10*3/uL (ref 0.00–0.07)
Basophils Absolute: 0 10*3/uL (ref 0.0–0.1)
Basophils Relative: 0 %
Eosinophils Absolute: 0.1 10*3/uL (ref 0.0–0.5)
Eosinophils Relative: 1 %
HCT: 36.1 % (ref 36.0–46.0)
Hemoglobin: 11.7 g/dL — ABNORMAL LOW (ref 12.0–15.0)
Immature Granulocytes: 1 %
Lymphocytes Relative: 12 %
Lymphs Abs: 1.1 10*3/uL (ref 0.7–4.0)
MCH: 27.3 pg (ref 26.0–34.0)
MCHC: 32.4 g/dL (ref 30.0–36.0)
MCV: 84.1 fL (ref 80.0–100.0)
Monocytes Absolute: 0.7 10*3/uL (ref 0.1–1.0)
Monocytes Relative: 8 %
Neutro Abs: 6.7 10*3/uL (ref 1.7–7.7)
Neutrophils Relative %: 78 %
Platelets: 310 10*3/uL (ref 150–400)
RBC: 4.29 MIL/uL (ref 3.87–5.11)
RDW: 14.9 % (ref 11.5–15.5)
WBC: 8.6 10*3/uL (ref 4.0–10.5)
nRBC: 0 % (ref 0.0–0.2)

## 2023-04-22 LAB — LIPASE, BLOOD: Lipase: 25 U/L (ref 11–51)

## 2023-04-22 LAB — TROPONIN I (HIGH SENSITIVITY): Troponin I (High Sensitivity): 5 ng/L (ref <18)

## 2023-04-22 MED ORDER — POTASSIUM CHLORIDE 20 MEQ PO PACK: 60.0000 meq | PACK | Freq: Two times a day (BID) | ORAL | Status: DC

## 2023-04-22 MED ORDER — OXYCODONE HCL 5 MG PO TABS
5.0000 mg | ORAL_TABLET | Freq: Once | ORAL | Status: AC
  Administered 2023-04-22: 5 mg via ORAL
  Filled 2023-04-22: qty 1

## 2023-04-22 MED ORDER — POTASSIUM CHLORIDE 20 MEQ PO PACK
60.0000 meq | PACK | Freq: Once | ORAL | Status: AC
  Administered 2023-04-22: 60 meq via ORAL
  Filled 2023-04-22: qty 3

## 2023-04-22 MED ORDER — ACETAMINOPHEN 500 MG PO TABS
1000.0000 mg | ORAL_TABLET | Freq: Once | ORAL | Status: AC
  Administered 2023-04-22: 1000 mg via ORAL
  Filled 2023-04-22: qty 2

## 2023-04-22 MED ORDER — SODIUM CHLORIDE 0.9 % IV BOLUS
1000.0000 mL | Freq: Once | INTRAVENOUS | Status: AC
  Administered 2023-04-22: 1000 mL via INTRAVENOUS

## 2023-04-22 MED ORDER — MAGNESIUM OXIDE -MG SUPPLEMENT 400 (240 MG) MG PO TABS
800.0000 mg | ORAL_TABLET | Freq: Once | ORAL | Status: AC
Start: 2023-04-22 — End: 2023-04-22
  Administered 2023-04-22: 800 mg via ORAL
  Filled 2023-04-22: qty 2

## 2023-04-22 NOTE — ED Triage Notes (Signed)
 Pt presents to ED from home. Chest pain with no radiation reported as tightness. Similar episode about a month prior but does not remember her diagnosis. 3 nitroglycerins taken at home with no relief. Reports some back pain and bilateral lower leg pain.

## 2023-04-22 NOTE — Discharge Instructions (Signed)
 Your labs here look ok.  Please follow up with your family doctor and oncologist in the office.

## 2023-04-22 NOTE — ED Provider Notes (Addendum)
 [place] EMERGENCY DEPARTMENT AT [facility] Provider Note   CSN: [id number] Arrival date & time: 04/22/23  1818     History  Chief Complaint  Patient presents with   Chest Pain    [name] is a 78 y.o. female.  77 yo F with a cc of chest pain.  Going on for the past day or so.  She was told it was due to her cancer before but she feels like the catheter is changed.  She normally takes oxycodone for her cancer pain that tried taking nitroglycerin for this which did not make any better.  She is now feeling a bit lightheaded.   Chest Pain      Home Medications Prior to Admission medications   Medication Sig Start Date End Date Taking? Authorizing Provider  aspirin 81 MG EC tablet Take 81 mg by mouth daily.     [provider]  atenolol (TENORMIN) 25 MG tablet TAKE 1 TABLET BY MOUTH EVERY DAY. 01/20/23   Corwin Levins, MD  atorvastatin (LIPITOR) 40 MG tablet Take 1 tablet (40 mg total) by mouth daily. Patient not taking: Reported on 03/21/2023 10/07/22 01/05/23  Tonny Bollman, MD  fluconazole (DIFLUCAN) 150 MG tablet Take one tab by mouth every 3 days as needed Patient not taking: Reported on 04/13/2023 03/30/23   Corwin Levins, MD  Foot Care Products ([employer] GEL HEEL CUSHION WOMENS) PADS Use as directed at bedtime 03/30/23   Corwin Levins, MD  gabapentin (NEURONTIN) 300 MG capsule Take 1-2 tab by mouth at bedtime for pain and sleep Patient taking differently: Take 300 mg by mouth as needed. Take 1-2 tab by mouth at bedtime for pain and sleep 04/22/21   Corwin Levins, MD  HYDROcodone-acetaminophen [facility]) 10-325 MG tablet Take 1 tablet by mouth every 8 (eight) hours as needed. 04/10/23   Corwin Levins, MD  isosorbide mononitrate (IMDUR) 30 MG 24 hr tablet Take 0.5 tablets (15 mg total) by mouth daily. 03/28/23 05/26/23  Lanae Boast, MD  levofloxacin (LEVAQUIN) 500 MG tablet Take 1 tablet (500 mg total) by mouth daily. 04/10/23   Corwin Levins, MD  levothyroxine  (SYNTHROID) 100 MCG tablet Take 1 tablet (100 mcg total) by mouth daily before breakfast. 03/28/23 04/27/23  Lanae Boast, MD  temazepam (RESTORIL) 15 MG capsule Take 1 capsule (15 mg total) by mouth at bedtime as needed for sleep. 04/10/23   Corwin Levins, MD  Triamcinolone Acetonide (ZILRETTA) 32 MG SRER intra-articular injection 5 mL, intra-articular, bilat knees 01/27/23   Rodolph Bong, MD      Allergies    Crestor [rosuvastatin calcium], Prilosec [omeprazole], Miconazole nitrate, Augmentin [amoxicillin-pot clavulanate], and Doxycycline    Review of Systems   Review of Systems  Cardiovascular:  Positive for chest pain.    Physical Exam Updated Vital Signs BP (!) 109/59   Pulse 70   Temp 98.7 F (37.1 C) (Oral)   Resp 19   Ht 5\' 6"  (1.676 m)   Wt 73.5 kg   SpO2 100%   BMI 26.15 kg/m  Physical Exam Vitals and nursing note reviewed.  Constitutional:      General: She is not in acute distress.    Appearance: She is well-developed. She is not diaphoretic.  HENT:     Head: Normocephalic and atraumatic.  Eyes:     Pupils: Pupils are equal, round, and reactive to light.  Cardiovascular:     Rate and Rhythm: Normal  rate and regular rhythm.     Heart sounds: No murmur heard.    No friction rub. No gallop.  Pulmonary:     Effort: Pulmonary effort is normal.     Breath sounds: No wheezing or rales.  Chest:     Chest wall: Tenderness present.     Comments: Palpation of the right anterior chest wall reproduces the patient's discomfort. Abdominal:     General: There is no distension.     Palpations: Abdomen is soft.     Tenderness: There is no abdominal tenderness.  Musculoskeletal:        General: No tenderness.     Cervical back: Normal range of motion and neck supple.  Skin:    General: Skin is warm and dry.  Neurological:     Mental Status: She is alert and oriented to person, place, and time.  Psychiatric:        Behavior: Behavior normal.     ED Results / Procedures  / Treatments   Labs (all labs ordered are listed, but only abnormal results are displayed) Labs Reviewed  CBC WITH DIFFERENTIAL/PLATELET - Abnormal; Notable for the following components:      Result Value   Hemoglobin 11.7 (*)    All other components within normal limits  COMPREHENSIVE METABOLIC PANEL WITH GFR - Abnormal; Notable for the following components:   Potassium 2.9 (*)    BUN <5 (*)    Calcium 7.6 (*)    Albumin 2.2 (*)    AST 14 (*)    All other components within normal limits  LIPASE, BLOOD  TROPONIN I (HIGH SENSITIVITY)    EKG None  Radiology DG Chest Port 1 View Result Date: 04/22/2023 CLINICAL DATA:  Chest pain. EXAM: PORTABLE CHEST 1 VIEW COMPARISON:  Chest radiograph dated 03/27/2023. FINDINGS: Similar appearance of an area of pleural thickening in the right upper lobe. No new consolidation. There is no pleural effusion pneumothorax. Stable cardiac silhouette. Coronary vascular stent. No acute osseous pathology. Spinal fusion hardware. IMPRESSION: No active disease. Electronically Signed   By: Elgie Collard M.D.   On: 04/22/2023 19:16   MR BRAIN W WO CONTRAST Result Date: 04/21/2023 CLINICAL DATA:  Provided history: Malignant neoplasm of unspecified part of unspecified bronchus or lung. Non-small cell lung cancer, staging. EXAM: MRI HEAD WITHOUT AND WITH CONTRAST TECHNIQUE: Multiplanar, multiecho pulse sequences of the brain and surrounding structures were obtained without and with intravenous contrast. CONTRAST:  7mL GADAVIST GADOBUTROL 1 MMOL/ML IV SOLN COMPARISON:  None. FINDINGS: Brain: Moderate generalized cerebral atrophy.  Mild cerebellar atrophy. Multifocal T2 FLAIR hyperintense signal abnormality within the cerebral white matter and pons, nonspecific but compatible with mild chronic small vessel ischemic disease. Subcentimeter focus of susceptibility-weighted signal loss within the right cerebellar hemisphere, which may reflect a chronic microhemorrhage or  cavernoma. Partially empty sella turcica. No cortical encephalomalacia is identified. There is no acute infarct. No evidence of an intracranial mass. No extra-axial fluid collection. No midline shift. The axial T1-weighted post-contrast sequence is mild-to-moderately motion degraded. Within this limitation, no pathologic intracranial enhancement identified. Vascular: Maintained flow voids within the proximal large arterial vessels. Skull and upper cervical spine: No focal worrisome marrow lesion. Incompletely assessed cervical spondylosis. Sinuses/Orbits: No mass or acute finding within the imaged orbits. Prior bilateral ocular lens replacement. No significant paranasal sinus disease. IMPRESSION: 1. The axial T1-weighted post-contrast sequence is mild-to-moderately motion degraded. Within this limitation, no evidence of intracranial metastatic disease. 2. Mild chronic small vessel ischemic changes  within the cerebral white matter and pons. 3. Subcentimeter focus of signal abnormality within the right cerebellar hemisphere, which may reflect a chronic microhemorrhage or cavernoma. 4. Moderate generalized cerebral atrophy. 5. Mild cerebellar atrophy. Electronically Signed   By: Jackey Loge D.O.   On: 04/21/2023 20:18    Procedures .Critical Care  Performed by: Melene Plan, DO Authorized by: Melene Plan, DO   Critical care provider statement:    Critical care time (minutes):  35   Critical care time was exclusive of:  Separately billable procedures and treating other patients   Critical care was time spent personally by me on the following activities:  Development of treatment plan with patient or surrogate, discussions with consultants, evaluation of patient's response to treatment, examination of patient, ordering and review of laboratory studies, ordering and review of radiographic studies, ordering and performing treatments and interventions, pulse oximetry, re-evaluation of patient's condition and review  of old charts   Care discussed with: admitting provider       Medications Ordered in ED Medications  magnesium oxide (MAG-OX) tablet 800 mg (has no administration in time range)  potassium chloride (KLOR-CON) packet 60 mEq (has no administration in time range)  sodium chloride 0.9 % bolus 1,000 mL (1,000 mLs Intravenous New Bag/Given 04/22/23 1843)  acetaminophen (TYLENOL) tablet 1,000 mg (1,000 mg Oral Given 04/22/23 1937)  oxyCODONE (Oxy IR/ROXICODONE) immediate release tablet 5 mg (5 mg Oral Given 04/22/23 1937)    ED Course/ Medical Decision Making/ A&P                                 Medical Decision Making Amount and/or Complexity of Data Reviewed Labs: ordered. Radiology: ordered.  Risk OTC drugs. Prescription drug management.   78 yo F with a chief complaint of chest pain.  This has been going on for about 12 hours now.  She was worried because of feeling of pressure.  Occurring in the exact same location that she has been having her cancer pain.  She tried taking nitroglycerin for this but without improvement.  Blood pressure in the 80s on arrival here.  Will give a bolus of IV fluids.  BP improved with fluids.  Trop negative.  No acute anemia.  Potassium low will replete orally.   Discussed results with patient she is feeling better.  Will follow up with her PCP in the office.   7:49 PM:  I have discussed the diagnosis/risks/treatment options with the patient.  Evaluation and diagnostic testing in the emergency department does not suggest an emergent condition requiring admission or immediate intervention beyond what has been performed at this time.  They will follow up with PCP. We also discussed returning to the ED immediately if new or worsening sx occur. We discussed the sx which are most concerning (e.g., sudden worsening pain, fever, inability to tolerate by mouth) that necessitate immediate return. Medications administered to the patient during their visit and any new  prescriptions provided to the patient are listed below.  Medications given during this visit Medications  magnesium oxide (MAG-OX) tablet 800 mg (has no administration in time range)  potassium chloride (KLOR-CON) packet 60 mEq (has no administration in time range)  sodium chloride 0.9 % bolus 1,000 mL (1,000 mLs Intravenous New Bag/Given 04/22/23 1843)  acetaminophen (TYLENOL) tablet 1,000 mg (1,000 mg Oral Given 04/22/23 1937)  oxyCODONE (Oxy IR/ROXICODONE) immediate release tablet 5 mg (5 mg Oral Given 04/22/23  1610)     The patient appears reasonably screen and/or stabilized for discharge and I doubt any other medical condition or other [facility] requiring further screening, evaluation, or treatment in the ED at this time prior to discharge.          Final Clinical Impression(s) / ED Diagnoses Final diagnoses:  Nonspecific chest pain    Rx / DC Orders ED Discharge Orders     None         Melene Plan, DO 04/22/23 1949    Melene Plan, DO 04/22/23 1949

## 2023-04-24 ENCOUNTER — Ambulatory Visit

## 2023-04-25 ENCOUNTER — Other Ambulatory Visit: Payer: Self-pay | Admitting: Radiation Oncology

## 2023-04-25 ENCOUNTER — Ambulatory Visit
Admission: RE | Admit: 2023-04-25 | Discharge: 2023-04-25 | Disposition: A | Discharge status: Home / Self Care | Source: Ambulatory Visit | Attending: Radiation Oncology | Admitting: Radiation Oncology

## 2023-04-25 ENCOUNTER — Other Ambulatory Visit: Payer: Self-pay

## 2023-04-25 ENCOUNTER — Other Ambulatory Visit (HOSPITAL_COMMUNITY): Payer: Self-pay

## 2023-04-25 ENCOUNTER — Telehealth: Payer: Self-pay

## 2023-04-25 DIAGNOSIS — C3411 Malignant neoplasm of upper lobe, right bronchus or lung: Secondary | ICD-10-CM | POA: Diagnosis not present

## 2023-04-25 DIAGNOSIS — Z510 Encounter for antineoplastic radiation therapy: Secondary | ICD-10-CM | POA: Diagnosis not present

## 2023-04-25 DIAGNOSIS — R300 Dysuria: Secondary | ICD-10-CM | POA: Diagnosis not present

## 2023-04-25 DIAGNOSIS — Z87891 Personal history of nicotine dependence: Secondary | ICD-10-CM | POA: Diagnosis not present

## 2023-04-25 LAB — URINALYSIS, COMPLETE (UACMP) WITH MICROSCOPIC
Bilirubin Urine: NEGATIVE
Glucose, UA: NEGATIVE mg/dL
Hgb urine dipstick: NEGATIVE
Ketones, ur: NEGATIVE mg/dL
Nitrite: NEGATIVE
Protein, ur: NEGATIVE mg/dL
Specific Gravity, Urine: 1.009 (ref 1.005–1.030)
pH: 6 (ref 5.0–8.0)

## 2023-04-25 LAB — RAD ONC ARIA SESSION SUMMARY
Course Elapsed Days: 6
Plan Fractions Treated to Date: 4
Plan Prescribed Dose Per Fraction: 3 Gy
Plan Total Fractions Prescribed: 10
Plan Total Prescribed Dose: 30 Gy
Reference Point Dosage Given to Date: 12 Gy
Reference Point Session Dosage Given: 3 Gy
Session Number: 4

## 2023-04-25 MED ORDER — HYDROCODONE-ACETAMINOPHEN 10-325 MG PO TABS: 1.0000 | ORAL_TABLET | Freq: Three times a day (TID) | ORAL | 0 refills | Status: DC | PRN

## 2023-04-25 NOTE — Progress Notes (Unsigned)
 [facility] OFFICE PROGRESS NOTE  Brandi Levins, MD [address] [place] [address]  DIAGNOSIS: Stage IIIB (T4, N2b, M0) non-small cell lung cancer, squamous cell carcinoma. She presented with large pleural-based peripheral right upper lobe lung mass measuring up to 8.2 cm in size with large right paratracheal node with low level hypermetabolic uptake.  PDL1: 15  PRIOR THERAPY: None  CURRENT THERAPY: concurrent chemoradiation with carboplatin for an AUC of 2 and taxol 45 mg/m2. First dose expected on either 05/02/23 or 05/08/23  INTERVAL HISTORY: [name] 78 y.o. female returns to the clinic today for a follow up visit. The patient established care with Dr. Arbutus Ped on 04/10/23.  At that point in time, the patient needed to complete the staging workup with a PET scan and a brain MRI.  She has since had this on 04/21/23.  Her brain MRI did not show any evidence of metastatic disease to the brain.   In the interval the patient met with radiation oncology and she is receiving palliative radiation to the lung mass which is causing lower rib pain.  They are anticipating 10 fractions at this point in time. The last day of radiation is scheduled for 4/17. Dr. Arbutus Ped referred her to palliative care and she has an appointment on 05/02/23. Overall, her pain is well controlled at this time.  He takes oxycodone.  She states that without the oxycodone her pain is a 10 out of 10.  With oxycodone and takes her pain level down to 0.  Overall she states her breathing is "good".  She does not exert herself so she denies any dyspnea on exertion.  She coughs "now and then" but primarily when she is laying on her back.  She every now and then may have mild blood-tinged sputum but reports this is not continuous.  She denies any nausea or vomiting.  She sometimes has constipation.  She is not taking anything for constipation.  She wants to know what she can take.  Denies any headache or visual  changes.  She is here today for evaluation and for a more detailed discussion about her current condition and treatment options.  MEDICAL HISTORY: Past Medical History:  Diagnosis Date   AAA (abdominal aortic aneurysm) (HCC)    a. s/p stent graft repair 01/2016.   Acute ischemic colitis (HCC) 07/29/2010   Diagnosed June, 2012 characterized by acute lower GI bleeding, spontaneously resolved.    Acute respiratory infection 06/09/2014   Acute sinus infection 05/14/2015   Allergic rhinitis, cause unspecified    Angioedema of lips 06/03/2014   Anxiety state, unspecified    Arthritis of left hip 04/16/2015   Bilateral hearing loss 07/19/2017   Chronic LBP    Coronary artery disease    a. inferior MI 1998 s/p PCI of RCA. b. stenting of Cx 04/2010. c. DES to prox LAD 05/2013. d. DES x 3 in 10/2015. // Myoview 07/2018:  EF 59, normal perfusion, low risk    Coronary artery disease involving native heart without angina pectoris 07/31/2006   Qualifier: Diagnosis of  By: Debby Bud MD, Rosalyn Gess  ANGIOGRAPHIC DATA:  1. Ventriculography done in the RAO projection reveals a small wall  motion abnormality in the mid inferior wall. Ejection fraction  would be estimated at around 50%.  2. The right coronary artery has some progressive disease of about 60-  80% in the proximal mid segment. The distal vessel appears to be  50-70% narrowing althou  Degeneration of lumbar or lumbosacral intervertebral disc    Depressive disorder, not elsewhere classified    Diabetes (HCC) 11/03/2006   Qualifier: Diagnosis of  By: Jonny Ruiz MD, Len Blalock    Diabetes mellitus    TYPE II   Frequent urination 05/08/2014   Gout 08/22/2013   History of endovascular stent graft for abdominal aortic aneurysm (AAA) 01/22/2016   Hyperlipidemia    Hypertension    Hypothyroidism    Iron deficiency anemia 06/13/2017   Lower GI bleed 06/2010   Diverticular bleed   Lumbar radiculopathy 05/15/2015   MENOPAUSAL DISORDER 10/29/2007   Qualifier: Diagnosis of  By:  Jonny Ruiz MD, Len Blalock    Myalgia 09/25/2013   Noncompliance with medications 02/26/2014   Obesity, unspecified    OTITIS MEDIA, SEROUS, CHRONIC 04/23/2007   Qualifier: Diagnosis of  By: Debby Bud MD, Rosalyn Gess    Thoracic disc disease 11/19/2018   URTICARIA 09/23/2009   Qualifier: Diagnosis of  By: Felicity Coyer MD, Vikki Ports A    Venous insufficiency 07/19/2017    ALLERGIES:  is allergic to crestor Duncan Dull calcium], prilosec [omeprazole], miconazole nitrate, augmentin [amoxicillin-pot clavulanate], and doxycycline.  MEDICATIONS:  Current Outpatient Medications  Medication Sig Dispense Refill   aspirin 81 MG EC tablet Take 81 mg by mouth daily.      atenolol (TENORMIN) 25 MG tablet TAKE 1 TABLET BY MOUTH EVERY DAY. 90 tablet 1   atorvastatin (LIPITOR) 40 MG tablet Take 1 tablet (40 mg total) by mouth daily. (Patient not taking: Reported on 03/21/2023) 90 tablet 3   fluconazole (DIFLUCAN) 150 MG tablet Take one tab by mouth every 3 days as needed (Patient not taking: Reported on 04/13/2023) 2 tablet 1   Foot Care Products ([employer] GEL HEEL CUSHION WOMENS) PADS Use as directed at bedtime 2 each 5   gabapentin (NEURONTIN) 300 MG capsule Take 1-2 tab by mouth at bedtime for pain and sleep (Patient taking differently: Take 300 mg by mouth as needed. Take 1-2 tab by mouth at bedtime for pain and sleep) 180 capsule 1   HYDROcodone-acetaminophen (NORCO) 10-325 MG tablet Take 1 tablet by mouth every 8 (eight) hours as needed. 60 tablet 0   isosorbide mononitrate (IMDUR) 30 MG 24 hr tablet Take 0.5 tablets (15 mg total) by mouth daily. 30 tablet 0   levofloxacin (LEVAQUIN) 500 MG tablet Take 1 tablet (500 mg total) by mouth daily. 10 tablet 0   levothyroxine (SYNTHROID) 100 MCG tablet Take 1 tablet (100 mcg total) by mouth daily before breakfast. 30 tablet 0   temazepam (RESTORIL) 15 MG capsule Take 1 capsule (15 mg total) by mouth at bedtime as needed for sleep. 30 capsule 2   Triamcinolone Acetonide (ZILRETTA) 32 MG SRER  intra-articular injection 5 mL, intra-articular, bilat knees 2 each 0   No current facility-administered medications for this visit.    SURGICAL HISTORY:  Past Surgical History:  Procedure Laterality Date   CARDIAC CATHETERIZATION     PCI OF BOTH THE CIRCUMFLEX AND LEFT ANTERIOR DESCENDING ARTERY   CARDIAC CATHETERIZATION N/A 10/29/2015   Procedure: Coronary Stent Intervention;  Surgeon: Yvonne Kendall, MD;  Location: [facility] INVASIVE CV LAB;  Service: Cardiovascular;  Laterality: N/A;   CARDIAC CATHETERIZATION N/A 10/29/2015   Procedure: Coronary/Graft Angiography;  Surgeon: Yvonne Kendall, MD;  Location: [facility] INVASIVE CV LAB;  Service: Cardiovascular;  Laterality: N/A;   CARDIAC CATHETERIZATION N/A 10/29/2015   Procedure: Intravascular Pressure Wire/FFR Study;  Surgeon: Yvonne Kendall, MD;  Location[facility] INVASIVE CV LAB;  Service: Cardiovascular;  Laterality: N/A;   CESAREAN SECTION     ENDOVASCULAR STENT INSERTION N/A 01/22/2016   Procedure: ABDOMINAL AORTIC ENDOVASCULAR STENT GRAFT INSERTION;  Surgeon: Larina Earthly, MD;  Location: [facility] OR;  Service: Vascular;  Laterality: N/A;   HEART STENT  04-2010  and  Jun 07, 2013   X 3   LEFT HEART CATH AND CORONARY ANGIOGRAPHY N/A 04/03/2019   Procedure: LEFT HEART CATH AND CORONARY ANGIOGRAPHY;  Surgeon: Yvonne Kendall, MD;  Location: [facility] INVASIVE CV LAB;  Service: Cardiovascular;  Laterality: N/A;   LEFT HEART CATHETERIZATION WITH CORONARY ANGIOGRAM N/A 06/07/2013   Procedure: LEFT HEART CATHETERIZATION WITH CORONARY ANGIOGRAM;  Surgeon: Kathleene Hazel, MD;  Location: [facility] CATH LAB;  Service: Cardiovascular;  Laterality: N/A;   LEFT HEART CATHETERIZATION WITH CORONARY ANGIOGRAM N/A 02/25/2014   Procedure: LEFT HEART CATHETERIZATION WITH CORONARY ANGIOGRAM;  Surgeon: Lennette Bihari, MD;  Location: [facility] CATH LAB;  Service: Cardiovascular;  Laterality: N/A;   LUMBAR FUSION  01/2007   DR. Noel Gerold...3-LEVEL WITH FIXATION   OOPHORECTOMY     BSO? pt.unsure    PARATHYROIDECTOMY     SPINE SURGERY     THYROIDECTOMY     TOTAL ABDOMINAL HYSTERECTOMY      REVIEW OF SYSTEMS:   Review of Systems  Constitutional: Negative for appetite change, chills, fatigue, fever and unexpected weight change.  HENT: Negative for mouth sores, nosebleeds, sore throat and trouble swallowing.   Eyes: Negative for eye problems and icterus.  Respiratory: Negative for cough, hemoptysis, shortness of breath and wheezing.   Cardiovascular: Positive for improving rib pain.  Negative for leg swelling.  Gastrointestinal: Positive for constipation. Negative for abdominal pain,  diarrhea, nausea and vomiting.  Genitourinary: Negative for bladder incontinence, difficulty urinating, dysuria, frequency and hematuria.   Musculoskeletal: Negative for back pain, gait problem, neck pain and neck stiffness.  Skin: Negative for itching and rash.  Neurological: Negative for dizziness, extremity weakness, gait problem, headaches, light-headedness and seizures.  Hematological: Negative for adenopathy. Does not bruise/bleed easily.  Psychiatric/Behavioral: Negative for confusion, depression and sleep disturbance. The patient is not nervous/anxious.     PHYSICAL EXAMINATION:  There were no vitals taken for this visit.  ECOG PERFORMANCE STATUS: 2  Physical Exam  Constitutional: Oriented to person, place, and time and well-developed, well-nourished, and in no distress.  Head: Normocephalic and atraumatic.  Mouth/Throat: Oropharynx is clear and moist. No oropharyngeal exudate.  Eyes: Conjunctivae are normal. Right eye exhibits no discharge. Left eye exhibits no discharge. No scleral icterus.  Neck: Normal range of motion. Neck supple.  Cardiovascular: Normal rate, regular rhythm, normal heart sounds and intact distal pulses.   Pulmonary/Chest: Effort normal and breath sounds normal. No respiratory distress. No wheezes. No rales.  Abdominal: Soft. Bowel sounds are normal. Exhibits no  distension and no mass. There is no tenderness.  Musculoskeletal: Normal range of motion. Exhibits no edema.  Lymphadenopathy:    No cervical adenopathy.  Neurological: Alert and oriented to person, place, and time. Exhibits normal muscle tone. Gait normal. Coordination normal.  Skin: Skin is warm and dry. No rash noted. Not diaphoretic. No erythema. No pallor.  Psychiatric: Mood, memory and judgment normal.  Vitals reviewed.  LABORATORY DATA: Lab Results  Component Value Date   WBC 8.6 04/22/2023   HGB 11.7 (L) 04/22/2023   HCT 36.1 04/22/2023   MCV 84.1 04/22/2023   PLT 310 04/22/2023      Chemistry      Component Value Date/Time  NA 139 04/22/2023 1830   NA 141 06/08/2021 1606   K 2.9 (L) 04/22/2023 1830   CL 103 04/22/2023 1830   CO2 26 04/22/2023 1830   BUN <5 (L) 04/22/2023 1830   BUN 4 (L) 06/08/2021 1606   CREATININE 0.68 04/22/2023 1830   CREATININE 0.88 04/10/2023 1119      Component Value Date/Time   CALCIUM 7.6 (L) 04/22/2023 1830   ALKPHOS 72 04/22/2023 1830   AST 14 (L) 04/22/2023 1830   AST 13 (L) 04/10/2023 1119   ALT 8 04/22/2023 1830   ALT 5 04/10/2023 1119   BILITOT 1.0 04/22/2023 1830   BILITOT 0.6 04/10/2023 1119       RADIOGRAPHIC STUDIES:  DG Chest Port 1 View Result Date: 04/22/2023 CLINICAL DATA:  Chest pain. EXAM: PORTABLE CHEST 1 VIEW COMPARISON:  Chest radiograph dated 03/27/2023. FINDINGS: Similar appearance of an area of pleural thickening in the right upper lobe. No new consolidation. There is no pleural effusion pneumothorax. Stable cardiac silhouette. Coronary vascular stent. No acute osseous pathology. Spinal fusion hardware. IMPRESSION: No active disease. Electronically Signed   By: Elgie Collard M.D.   On: 04/22/2023 19:16   MR BRAIN W WO CONTRAST Result Date: 04/21/2023 CLINICAL DATA:  Provided history: Malignant neoplasm of unspecified part of unspecified bronchus or lung. Non-small cell lung cancer, staging. EXAM: MRI HEAD  WITHOUT AND WITH CONTRAST TECHNIQUE: Multiplanar, multiecho pulse sequences of the brain and surrounding structures were obtained without and with intravenous contrast. CONTRAST:  7mL GADAVIST GADOBUTROL 1 MMOL/ML IV SOLN COMPARISON:  None. FINDINGS: Brain: Moderate generalized cerebral atrophy.  Mild cerebellar atrophy. Multifocal T2 FLAIR hyperintense signal abnormality within the cerebral white matter and pons, nonspecific but compatible with mild chronic small vessel ischemic disease. Subcentimeter focus of susceptibility-weighted signal loss within the right cerebellar hemisphere, which may reflect a chronic microhemorrhage or cavernoma. Partially empty sella turcica. No cortical encephalomalacia is identified. There is no acute infarct. No evidence of an intracranial mass. No extra-axial fluid collection. No midline shift. The axial T1-weighted post-contrast sequence is mild-to-moderately motion degraded. Within this limitation, no pathologic intracranial enhancement identified. Vascular: Maintained flow voids within the proximal large arterial vessels. Skull and upper cervical spine: No focal worrisome marrow lesion. Incompletely assessed cervical spondylosis. Sinuses/Orbits: No mass or acute finding within the imaged orbits. Prior bilateral ocular lens replacement. No significant paranasal sinus disease. IMPRESSION: 1. The axial T1-weighted post-contrast sequence is mild-to-moderately motion degraded. Within this limitation, no evidence of intracranial metastatic disease. 2. Mild chronic small vessel ischemic changes within the cerebral white matter and pons. 3. Subcentimeter focus of signal abnormality within the right cerebellar hemisphere, which may reflect a chronic microhemorrhage or cavernoma. 4. Moderate generalized cerebral atrophy. 5. Mild cerebellar atrophy. Electronically Signed   By: Jackey Loge D.O.   On: 04/21/2023 20:18   DG Chest Port 1 View Result Date: 03/27/2023 CLINICAL DATA:  [id number]  Post-operative state [id number] EXAM: PORTABLE CHEST 1 VIEW COMPARISON:  Chest XR, 03/21/2023. CT chest, 03/20/2023. IR CT, earlier same day. FINDINGS: Cardiac silhouette is within normal limits. Lungs are hypoinflated. The LEFT lung is clear. Similar degree of RIGHT upper chest no pleural thickening, with associated pulmonary opacity and post procedural change. No pleural effusion or pneumothorax. Multilevel thoracic spine fusion. No acute osseous abnormality. IMPRESSION: Post biopsy changes at the RIGHT upper chest.  No pneumothorax. Electronically Signed   By: Roanna Banning M.D.   On: 03/27/2023 11:32   CT LUNG MASS BIOPSY Result Date:  03/27/2023 INDICATION: RIGHT pleural lung.  No diagnosis. EXAM: CT-GUIDED RIGHT PLEURAL MASS BIOPSY COMPARISON:  CT CHEST, 03/20/2023 MEDICATIONS: None. ANESTHESIA/SEDATION: Moderate (conscious) sedation was employed during this procedure. A total of Versed 2 mg and Fentanyl 75 mcg was administered intravenously. Moderate Sedation Time: 23 minutes. The patient's level of consciousness and vital signs were monitored continuously by radiology nursing throughout the procedure under my direct supervision. CONTRAST:  None FLUOROSCOPY TIME:  CT dose; 536 mGycm COMPLICATIONS: None immediate. PROCEDURE: RADIATION DOSE REDUCTION: This exam was performed according to the departmental dose-optimization program which includes automated exposure control, adjustment of the mA and/or kV according to patient size and/or use of iterative reconstruction technique. Informed consent was obtained from the patient following an explanation of the procedure, risks, benefits and alternatives. The patient understands,agrees and consents for the procedure. All questions were addressed. A time out was performed prior to the initiation of the procedure. The patient was positioned supine on the CT table and a limited chest CT was performed for procedural planning demonstrating RIGHT pleural mass. The operative  site was prepped and draped in the usual sterile fashion. Under sterile conditions and local anesthesia, a 17 gauge coaxial needle was advanced into the peripheral aspect of the nodule. Positioning was confirmed with intermittent CT fluoroscopy and followed by the acquisition of RIGHT pleural mass with an 18 gauge core needle biopsy device. The coaxial needle was removed following deployment of hemostatic patch and superficial hemostasis was achieved with manual compression. Limited post procedural chest CT was negative for pneumothorax or additional complication. A dressing was placed. The patient tolerated the procedure well without immediate postprocedural complication. The patient was escorted to have an upright chest radiograph. IMPRESSION: Successful CT guided core needle core biopsy of RIGHT pleural mass. Roanna Banning, MD [facility] Electronically Signed   By: Roanna Banning M.D.   On: 03/27/2023 10:59     ASSESSMENT/PLAN:  This is a very pleasant 78 years old African-American female with highly suspicious stage IIIB (T4, N2, M0) non-small cell lung cancer, squamous cell carcinoma.  She presented with large pleural-based peripheral right upper lobe lung mass measuring up to 8.2 cm in size with large right paratracheal node with low level hypermetabolic uptake.  She was diagnosed in March 2025.  Her PD-L1 expression is 15%.  The patient is currently undergoing a short course of radiation to the painful primary lesion.  The patient was seen with Dr. Arbutus Ped.  He reviewed the brain MRI and the PET scan.  Her brain MRI was negative for metastatic disease.  Dr. Arbutus Ped recommends treatment with concurrent chemoradiation with carboplatin weekly for an AUC of 2 and paclitaxel 45 mg/m.  We will reach out to radiation to see if they are in agreement with this and if they would consider extending her radiation to 6 weeks.  As radiation has started  treatment already, we will try to start chemotherapy on Tuesday next week/15/25.  I reached out to the insurance authorization team and it has since been approved.   Will arrange for a chemo education class prior to her first cycle of treatment.  The adverse side effects of treatment were discussed including but not limited to nausea, vomiting, myelosuppression, kidney and liver dysfunction, fatigue, and risk of infection.  I sent a prescription for Compazine 10 mg p.o. every 6 hours as needed to the patient's pharmacy for nausea and vomiting.  I also sent Emla cream to the pharmacy.  I  will also arrange for Port-A-Cath to be placed.  We will see her back for a follow-up visit with her second week of chemotherapy.  We reviewed constipation education today.  Will establish care with palliative care next week on/15/25.  Overall sounds like her pain is much better controlled at this time.  The patient was advised to call immediately if she has any concerning symptoms in the interval. The patient voices understanding of current disease status and treatment options and is in agreement with the current care plan. All questions were answered. The patient knows to call the clinic with any problems, questions or concerns. We can certainly see the patient much sooner if necessary   No orders of the defined types were placed in this encounter.    Brandi Puertas L Kawon Willcutt, [name] 04/25/23  ADDENDUM: Hematology/Oncology Attending:  I had a face-to-face encounter with the patient today.  I reviewed her record, lab, scan and recommended her care plan.  This is a very pleasant 78 years old African-American female diagnosed with stage IIIb non-small cell lung cancer, squamous cell carcinoma presented with large pleural-based peripheral right upper lobe lung mass measuring up to 8.2 cm in size with large right paratracheal lymphadenopathy.  She has PD-L1 expression of 15%.  The patient was started short  course of palliative radiotherapy for the painful lesion. She had a PET scan and MRI brain performed recently.  I personally and independently reviewed the imaging studies and discussed the results with the patient today. I recommended for the patient a course of concurrent chemoradiation with weekly carboplatin for AUC of 2 and paclitaxel 45 Mg/M2 concurrent with radiation for 6-7 weeks.  This will be followed by consolidation treatment with immunotherapy if she has no evidence for disease progression after the induction phase. I discussed with the patient the adverse effect of the chemotherapy including but not limited to alopecia, myelosuppression, nausea and vomiting, peripheral neuropathy, liver or renal dysfunction. The patient is expected to start the first cycle of this treatment next week. She will come back for follow-up visit in 2 weeks for evaluation and management of any adverse effect of her treatment. She was advised to call immediately if she has any other concerning symptoms in the interval. The total time spent in the appointment was 30 minutes. Disclaimer: This note was dictated with voice recognition software. Similar sounding words can inadvertently be transcribed and may be missed upon review. Brandi Matte, MD

## 2023-04-25 NOTE — Transitions of Care (Post Inpatient/ED Visit) (Signed)
   04/25/2023  Name: [name] MRN: [id number] DOB: 07-18-1945  Today's TOC FU Call Status: Today's TOC FU Call Status:: Unsuccessful Call (1st Attempt) Unsuccessful Call (1st Attempt) Date: 04/25/23  Attempted to reach the patient regarding the most recent Inpatient/ED visit.  Follow Up Plan: Additional outreach attempts will be made to reach the patient to complete the Transitions of Care (Post Inpatient/ED visit) call.   Signature Karena Addison, LPN [facility] Nurse Health Advisor Direct Dial [phone]

## 2023-04-25 NOTE — Telephone Encounter (Signed)
*  Primary  Pharmacy Patient Advocate Encounter   Received notification from CoverMyMeds that prior authorization for Temazepam 15MG  capsules  is required/requested.   Insurance verification completed.   The patient is insured through [employer] .   Per test claim: The current 30 day co-pay is, $2.76.  No PA needed at this time. This test claim was processed through [facility]- copay amounts may vary at other pharmacies due to pharmacy/plan contracts, or as the patient moves through the different stages of their insurance plan.

## 2023-04-26 ENCOUNTER — Other Ambulatory Visit: Payer: Self-pay

## 2023-04-26 ENCOUNTER — Ambulatory Visit
Admission: RE | Admit: 2023-04-26 | Discharge: 2023-04-26 | Disposition: A | Discharge status: Home / Self Care | Source: Ambulatory Visit | Attending: Radiation Oncology | Admitting: Radiation Oncology

## 2023-04-26 DIAGNOSIS — C3411 Malignant neoplasm of upper lobe, right bronchus or lung: Secondary | ICD-10-CM | POA: Diagnosis not present

## 2023-04-26 DIAGNOSIS — Z510 Encounter for antineoplastic radiation therapy: Secondary | ICD-10-CM | POA: Diagnosis not present

## 2023-04-26 DIAGNOSIS — Z87891 Personal history of nicotine dependence: Secondary | ICD-10-CM | POA: Diagnosis not present

## 2023-04-26 DIAGNOSIS — R300 Dysuria: Secondary | ICD-10-CM | POA: Diagnosis not present

## 2023-04-26 LAB — RAD ONC ARIA SESSION SUMMARY
Course Elapsed Days: 7
Plan Fractions Treated to Date: 5
Plan Prescribed Dose Per Fraction: 3 Gy
Plan Total Fractions Prescribed: 10
Plan Total Prescribed Dose: 30 Gy
Reference Point Dosage Given to Date: 15 Gy
Reference Point Session Dosage Given: 3 Gy
Session Number: 5

## 2023-04-26 LAB — URINE CULTURE: Culture: <10000 — AB

## 2023-04-26 NOTE — Transitions of Care (Post Inpatient/ED Visit) (Signed)
   04/26/2023  Name: [name] MRN: [id number] DOB: [birth year]  Today's TOC FU Call Status: Today's TOC FU Call Status:: Unsuccessful Call (2nd Attempt) Unsuccessful Call (1st Attempt) Date: 04/25/23 Unsuccessful Call (2nd Attempt) Date: 04/26/23  Attempted to reach the patient regarding the most recent Inpatient/ED visit.  Follow Up Plan: Additional outreach attempts will be made to reach the patient to complete the Transitions of Care (Post Inpatient/ED visit) call.   Signature Karena Addison, LPN [facility] Nurse Health Advisor Direct Dial [phone]

## 2023-04-26 NOTE — Progress Notes (Signed)
 Spoke to [place] in reading room requesting pt's PET scan be read in time for her f/u appt with oncology tomorrow at 3pm

## 2023-04-27 ENCOUNTER — Other Ambulatory Visit: Payer: Self-pay

## 2023-04-27 ENCOUNTER — Telehealth: Payer: Self-pay | Admitting: Medical Oncology

## 2023-04-27 ENCOUNTER — Ambulatory Visit

## 2023-04-27 ENCOUNTER — Inpatient Hospital Stay

## 2023-04-27 ENCOUNTER — Ambulatory Visit
Admission: RE | Admit: 2023-04-27 | Discharge: 2023-04-27 | Disposition: A | Discharge status: Home / Self Care | Source: Ambulatory Visit | Attending: Radiation Oncology | Admitting: Radiation Oncology

## 2023-04-27 ENCOUNTER — Inpatient Hospital Stay (HOSPITAL_BASED_OUTPATIENT_CLINIC_OR_DEPARTMENT_OTHER): Admitting: Physician Assistant

## 2023-04-27 VITALS — BP 102/64 | HR 67 | Temp 97.8°F | Resp 13 | Wt 166.5 lb

## 2023-04-27 DIAGNOSIS — C3490 Malignant neoplasm of unspecified part of unspecified bronchus or lung: Secondary | ICD-10-CM

## 2023-04-27 DIAGNOSIS — Z5111 Encounter for antineoplastic chemotherapy: Secondary | ICD-10-CM | POA: Insufficient documentation

## 2023-04-27 DIAGNOSIS — Z7189 Other specified counseling: Secondary | ICD-10-CM

## 2023-04-27 DIAGNOSIS — C3411 Malignant neoplasm of upper lobe, right bronchus or lung: Secondary | ICD-10-CM

## 2023-04-27 DIAGNOSIS — R300 Dysuria: Secondary | ICD-10-CM | POA: Diagnosis not present

## 2023-04-27 DIAGNOSIS — Z510 Encounter for antineoplastic radiation therapy: Secondary | ICD-10-CM | POA: Diagnosis not present

## 2023-04-27 DIAGNOSIS — Z87891 Personal history of nicotine dependence: Secondary | ICD-10-CM | POA: Diagnosis not present

## 2023-04-27 LAB — CMP (CANCER CENTER ONLY)
ALT: 7 U/L (ref 0–44)
AST: 17 U/L (ref 15–41)
Albumin: 3 g/dL — ABNORMAL LOW (ref 3.5–5.0)
Alkaline Phosphatase: 88 U/L (ref 38–126)
Anion gap: 4 — ABNORMAL LOW (ref 5–15)
BUN: <5 mg/dL — ABNORMAL LOW (ref 8–23)
CO2: 33 mmol/L — ABNORMAL HIGH (ref 22–32)
Calcium: 8.1 mg/dL — ABNORMAL LOW (ref 8.9–10.3)
Chloride: 101 mmol/L (ref 98–111)
Creatinine: 0.64 mg/dL (ref 0.44–1.00)
GFR, Estimated: >60 mL/min (ref >60)
Glucose, Bld: 89 mg/dL (ref 70–99)
Potassium: 3.9 mmol/L (ref 3.5–5.1)
Sodium: 138 mmol/L (ref 135–145)
Total Bilirubin: 0.5 mg/dL (ref 0.0–1.2)
Total Protein: 7.6 g/dL (ref 6.5–8.1)

## 2023-04-27 LAB — CBC WITH DIFFERENTIAL (CANCER CENTER ONLY)
Abs Immature Granulocytes: 0.03 10*3/uL (ref 0.00–0.07)
Basophils Absolute: 0 10*3/uL (ref 0.0–0.1)
Basophils Relative: 1 %
Eosinophils Absolute: 0.1 10*3/uL (ref 0.0–0.5)
Eosinophils Relative: 2 %
HCT: 37.1 % (ref 36.0–46.0)
Hemoglobin: 11.9 g/dL — ABNORMAL LOW (ref 12.0–15.0)
Immature Granulocytes: 1 %
Lymphocytes Relative: 20 %
Lymphs Abs: 1.3 10*3/uL (ref 0.7–4.0)
MCH: 26.6 pg (ref 26.0–34.0)
MCHC: 32.1 g/dL (ref 30.0–36.0)
MCV: 82.8 fL (ref 80.0–100.0)
Monocytes Absolute: 0.5 10*3/uL (ref 0.1–1.0)
Monocytes Relative: 8 %
Neutro Abs: 4.6 10*3/uL (ref 1.7–7.7)
Neutrophils Relative %: 68 %
Platelet Count: 382 10*3/uL (ref 150–400)
RBC: 4.48 MIL/uL (ref 3.87–5.11)
RDW: 15.1 % (ref 11.5–15.5)
WBC Count: 6.7 10*3/uL (ref 4.0–10.5)
nRBC: 0 % (ref 0.0–0.2)

## 2023-04-27 LAB — RAD ONC ARIA SESSION SUMMARY
Course Elapsed Days: 8
Plan Fractions Treated to Date: 6
Plan Prescribed Dose Per Fraction: 3 Gy
Plan Total Fractions Prescribed: 10
Plan Total Prescribed Dose: 30 Gy
Reference Point Dosage Given to Date: 18 Gy
Reference Point Session Dosage Given: 3 Gy
Session Number: 6

## 2023-04-27 MED ORDER — PROCHLORPERAZINE MALEATE 10 MG PO TABS: 10.0000 mg | ORAL_TABLET | Freq: Four times a day (QID) | ORAL | 2 refills | Status: DC | PRN

## 2023-04-27 MED ORDER — LIDOCAINE-PRILOCAINE 2.5-2.5 % EX CREA: 1.0000 | TOPICAL_CREAM | CUTANEOUS | 2 refills | Status: DC | PRN

## 2023-04-27 NOTE — Patient Instructions (Addendum)
-  There are two main categories of lung cancer, they are named based on the size of the cancer cell. One is called Non-Small cell lung cancer. The other type is Small Cell Lung Cancer -The sample (biopsy) that they took of your tumor was consistent with a subtype of Non-small cell lung cancer called Squamous Cell   -We covered a lot of important information at your appointment today regarding what the treatment plan is moving forward. Here are the the main points that were discussed at your office visit with [place] today:  -The treatment that you will receive consists of two chemotherapy drugs called Carboplatin and Paclitaxel (also called Taxol) -We are planning on starting your treatment next week on 05/02/23 but before your start your treatment, I would like you to attend a Chemotherapy Education Class. This involves having you sit down with one of our nurse educators. She will discuss with you one-on-one more details about your treatment as well as general information about resources here at the [facility]  -Your treatment will be given every week for about 6 weeks or so (as long as you are also receiving radiation). We will check your labs (blood work) once a week . Dr. Arbutus Ped or I will see you every other treatment just to make sure that you are doing well and that everything is on track. -We will get a CT scan about 3 weeks after you complete your treatment  Medications:  -Compazine was sent to your pharmacy. This medication is for nausea. You may take this every 6 hours as needed if you feel nausous.   Referrals -I will place the referral to radiation oncology to meet with you to discuss starting radiation treatment. Please be on the lookout for a phone call from them to schedule a consultation.   Follow Up:  -We will see you back for a follow up visit in 2 weeks

## 2023-04-27 NOTE — Progress Notes (Signed)
 START ON PATHWAY REGIMEN - Non-Small Cell Lung     A cycle is every 7 days, concurrent with RT:     Paclitaxel      Carboplatin   **Always confirm dose/schedule in your pharmacy ordering system**  Patient Characteristics: Preoperative or Nonsurgical Candidate (Clinical Staging), Stage IIB (N2a only) or Stage III - Nonsurgical Candidate, PS = 0,1 Therapeutic Status: Preoperative or Nonsurgical Candidate (Clinical Staging) AJCC T Category: cT4 AJCC N Category: cN2b AJCC M Category: cM0 AJCC 9 Stage Grouping: IIIB Check here if patient was staged using an edition other than AJCC Staging 9th Edition: false ECOG Performance Status: 1 Intent of Therapy: Non-Curative / Palliative Intent, Discussed with Patient

## 2023-04-27 NOTE — Telephone Encounter (Signed)
 I gave [facility] email address to dtr.

## 2023-04-28 ENCOUNTER — Other Ambulatory Visit: Payer: Self-pay

## 2023-04-28 ENCOUNTER — Ambulatory Visit
Admission: RE | Admit: 2023-04-28 | Discharge: 2023-04-28 | Disposition: A | Discharge status: Home / Self Care | Source: Ambulatory Visit | Attending: Radiation Oncology | Admitting: Radiation Oncology

## 2023-04-28 DIAGNOSIS — C3411 Malignant neoplasm of upper lobe, right bronchus or lung: Secondary | ICD-10-CM | POA: Diagnosis not present

## 2023-04-28 LAB — RAD ONC ARIA SESSION SUMMARY
Course Elapsed Days: 9
Plan Fractions Treated to Date: 7
Plan Prescribed Dose Per Fraction: 3 Gy
Plan Total Fractions Prescribed: 10
Plan Total Prescribed Dose: 30 Gy
Reference Point Dosage Given to Date: 21 Gy
Reference Point Session Dosage Given: 3 Gy
Session Number: 7

## 2023-05-01 ENCOUNTER — Ambulatory Visit
Admission: RE | Admit: 2023-05-01 | Discharge: 2023-05-01 | Disposition: A | Discharge status: Home / Self Care | Source: Ambulatory Visit | Attending: Radiation Oncology | Admitting: Radiation Oncology

## 2023-05-01 ENCOUNTER — Other Ambulatory Visit: Payer: Self-pay | Admitting: Radiation Oncology

## 2023-05-01 ENCOUNTER — Other Ambulatory Visit: Payer: Self-pay

## 2023-05-01 DIAGNOSIS — C3411 Malignant neoplasm of upper lobe, right bronchus or lung: Secondary | ICD-10-CM | POA: Diagnosis not present

## 2023-05-01 DIAGNOSIS — R300 Dysuria: Secondary | ICD-10-CM | POA: Diagnosis not present

## 2023-05-01 LAB — RAD ONC ARIA SESSION SUMMARY
Course Elapsed Days: 12
Plan Fractions Treated to Date: 8
Plan Prescribed Dose Per Fraction: 3 Gy
Plan Total Fractions Prescribed: 10
Plan Total Prescribed Dose: 30 Gy
Reference Point Dosage Given to Date: 24 Gy
Reference Point Session Dosage Given: 3 Gy
Session Number: 8

## 2023-05-01 MED ORDER — PHENAZOPYRIDINE HCL 200 MG PO TABS: 200.0000 mg | ORAL_TABLET | Freq: Three times a day (TID) | ORAL | 0 refills | Status: DC | PRN

## 2023-05-01 NOTE — Transitions of Care (Post Inpatient/ED Visit) (Signed)
   05/01/2023  Name: [name] MRN: [id number] DOB: 28-Jun-1945  Today's TOC FU Call Status: Today's TOC FU Call Status:: Unsuccessful Call (3rd Attempt) Unsuccessful Call (1st Attempt) Date: 04/25/23 Unsuccessful Call (2nd Attempt) Date: 04/26/23 Unsuccessful Call (3rd Attempt) Date: 05/01/23  Attempted to reach the patient regarding the most recent Inpatient/ED visit.  Follow Up Plan: No further outreach attempts will be made at this time. We have been unable to contact the patient.  Signature Darrall Ellison, LPN [facility] Nurse Health Advisor Direct Dial [phone]

## 2023-05-01 NOTE — Progress Notes (Signed)
 Pharmacist Chemotherapy Monitoring - Initial Assessment    Anticipated start date: 05/08/23   The following has been reviewed per standard work regarding the patient's treatment regimen: The patient's diagnosis, treatment plan and drug doses, and organ/hematologic function Lab orders and baseline tests specific to treatment regimen  The treatment plan start date, drug sequencing, and pre-medications Prior authorization status  Patient's documented medication list, including drug-drug interaction screen and prescriptions for anti-emetics and supportive care specific to the treatment regimen The drug concentrations, fluid compatibility, administration routes, and timing of the medications to be used The patient's access for treatment and lifetime cumulative dose history, if applicable  The patient's medication allergies and previous infusion related reactions, if applicable   Changes made to treatment plan:  N/A  Follow up needed:  Port placement   Kara Orts, PharmD, MBA

## 2023-05-02 ENCOUNTER — Other Ambulatory Visit: Payer: Self-pay

## 2023-05-02 ENCOUNTER — Ambulatory Visit

## 2023-05-02 ENCOUNTER — Ambulatory Visit
Admission: RE | Admit: 2023-05-02 | Discharge: 2023-05-02 | Disposition: A | Discharge status: Home / Self Care | Source: Ambulatory Visit | Attending: Radiation Oncology | Admitting: Radiation Oncology

## 2023-05-02 ENCOUNTER — Inpatient Hospital Stay: Admitting: Nurse Practitioner

## 2023-05-02 DIAGNOSIS — R300 Dysuria: Secondary | ICD-10-CM | POA: Diagnosis not present

## 2023-05-02 DIAGNOSIS — C3411 Malignant neoplasm of upper lobe, right bronchus or lung: Secondary | ICD-10-CM

## 2023-05-02 DIAGNOSIS — Z87891 Personal history of nicotine dependence: Secondary | ICD-10-CM | POA: Diagnosis not present

## 2023-05-02 DIAGNOSIS — Z510 Encounter for antineoplastic radiation therapy: Secondary | ICD-10-CM | POA: Diagnosis not present

## 2023-05-02 LAB — RAD ONC ARIA SESSION SUMMARY
Course Elapsed Days: 13
Plan Fractions Treated to Date: 1
Plan Prescribed Dose Per Fraction: 1.8 Gy
Plan Total Fractions Prescribed: 17
Plan Total Prescribed Dose: 30.6 Gy
Reference Point Dosage Given to Date: 1.8 Gy
Reference Point Session Dosage Given: 1.8 Gy
Session Number: 9

## 2023-05-03 ENCOUNTER — Other Ambulatory Visit: Payer: Self-pay

## 2023-05-03 ENCOUNTER — Ambulatory Visit

## 2023-05-03 ENCOUNTER — Ambulatory Visit
Admission: RE | Admit: 2023-05-03 | Discharge: 2023-05-03 | Disposition: A | Discharge status: Home / Self Care | Source: Ambulatory Visit | Attending: Radiation Oncology | Admitting: Radiation Oncology

## 2023-05-03 DIAGNOSIS — R300 Dysuria: Secondary | ICD-10-CM | POA: Diagnosis not present

## 2023-05-03 DIAGNOSIS — Z87891 Personal history of nicotine dependence: Secondary | ICD-10-CM | POA: Diagnosis not present

## 2023-05-03 DIAGNOSIS — C3411 Malignant neoplasm of upper lobe, right bronchus or lung: Secondary | ICD-10-CM | POA: Diagnosis not present

## 2023-05-03 DIAGNOSIS — Z510 Encounter for antineoplastic radiation therapy: Secondary | ICD-10-CM | POA: Diagnosis not present

## 2023-05-03 LAB — RAD ONC ARIA SESSION SUMMARY
Course Elapsed Days: 14
Plan Fractions Treated to Date: 2
Plan Prescribed Dose Per Fraction: 1.8 Gy
Plan Total Fractions Prescribed: 17
Plan Total Prescribed Dose: 30.6 Gy
Reference Point Dosage Given to Date: 3.6 Gy
Reference Point Session Dosage Given: 1.8 Gy
Session Number: 10

## 2023-05-04 ENCOUNTER — Ambulatory Visit

## 2023-05-05 ENCOUNTER — Ambulatory Visit

## 2023-05-05 ENCOUNTER — Inpatient Hospital Stay

## 2023-05-05 ENCOUNTER — Telehealth: Payer: Self-pay | Admitting: Nurse Practitioner

## 2023-05-05 NOTE — Telephone Encounter (Signed)
 Patient scheduled appointments. Patient is aware of all appointment details.

## 2023-05-08 ENCOUNTER — Inpatient Hospital Stay

## 2023-05-08 ENCOUNTER — Ambulatory Visit

## 2023-05-08 ENCOUNTER — Encounter: Payer: Self-pay | Admitting: Internal Medicine

## 2023-05-08 VITALS — BP 123/71 | HR 75 | Temp 98.5°F | Resp 21 | Wt 166.8 lb

## 2023-05-08 DIAGNOSIS — C3411 Malignant neoplasm of upper lobe, right bronchus or lung: Secondary | ICD-10-CM

## 2023-05-08 DIAGNOSIS — R300 Dysuria: Secondary | ICD-10-CM | POA: Diagnosis not present

## 2023-05-08 LAB — CMP (CANCER CENTER ONLY)
ALT: 6 U/L (ref 0–44)
AST: 10 U/L — ABNORMAL LOW (ref 15–41)
Albumin: 2.9 g/dL — ABNORMAL LOW (ref 3.5–5.0)
Alkaline Phosphatase: 84 U/L (ref 38–126)
Anion gap: 7 (ref 5–15)
BUN: 5 mg/dL — ABNORMAL LOW (ref 8–23)
CO2: 29 mmol/L (ref 22–32)
Calcium: 7.9 mg/dL — ABNORMAL LOW (ref 8.9–10.3)
Chloride: 104 mmol/L (ref 98–111)
Creatinine: 0.72 mg/dL (ref 0.44–1.00)
GFR, Estimated: >60 mL/min (ref >60)
Glucose, Bld: 88 mg/dL (ref 70–99)
Potassium: 3.6 mmol/L (ref 3.5–5.1)
Sodium: 140 mmol/L (ref 135–145)
Total Bilirubin: 0.4 mg/dL (ref 0.0–1.2)
Total Protein: 7.2 g/dL (ref 6.5–8.1)

## 2023-05-08 LAB — CBC WITH DIFFERENTIAL (CANCER CENTER ONLY)
Abs Immature Granulocytes: 0.04 10*3/uL (ref 0.00–0.07)
Basophils Absolute: 0 10*3/uL (ref 0.0–0.1)
Basophils Relative: 1 %
Eosinophils Absolute: 0.1 10*3/uL (ref 0.0–0.5)
Eosinophils Relative: 2 %
HCT: 35.3 % — ABNORMAL LOW (ref 36.0–46.0)
Hemoglobin: 11.5 g/dL — ABNORMAL LOW (ref 12.0–15.0)
Immature Granulocytes: 1 %
Lymphocytes Relative: 19 %
Lymphs Abs: 1.1 10*3/uL (ref 0.7–4.0)
MCH: 26.9 pg (ref 26.0–34.0)
MCHC: 32.6 g/dL (ref 30.0–36.0)
MCV: 82.5 fL (ref 80.0–100.0)
Monocytes Absolute: 0.6 10*3/uL (ref 0.1–1.0)
Monocytes Relative: 10 %
Neutro Abs: 4 10*3/uL (ref 1.7–7.7)
Neutrophils Relative %: 67 %
Platelet Count: 400 10*3/uL (ref 150–400)
RBC: 4.28 MIL/uL (ref 3.87–5.11)
RDW: 15.6 % — ABNORMAL HIGH (ref 11.5–15.5)
WBC Count: 5.8 10*3/uL (ref 4.0–10.5)
nRBC: 0 % (ref 0.0–0.2)

## 2023-05-08 MED ORDER — SODIUM CHLORIDE 0.9 % IV SOLN: INTRAVENOUS | Status: DC

## 2023-05-08 MED ORDER — SODIUM CHLORIDE 0.9 % IV SOLN
45.0000 mg/m2 | Freq: Once | INTRAVENOUS | Status: AC
  Administered 2023-05-08: 84 mg via INTRAVENOUS
  Filled 2023-05-08: qty 14

## 2023-05-08 MED ORDER — DIPHENHYDRAMINE HCL 50 MG/ML IJ SOLN
50.0000 mg | Freq: Once | INTRAMUSCULAR | Status: DC
  Filled 2023-05-08: qty 1

## 2023-05-08 MED ORDER — FAMOTIDINE IN NACL 20-0.9 MG/50ML-% IV SOLN
20.0000 mg | Freq: Once | INTRAVENOUS | Status: AC
  Administered 2023-05-08: 20 mg via INTRAVENOUS
  Filled 2023-05-08: qty 50

## 2023-05-08 MED ORDER — SODIUM CHLORIDE 0.9 % IV SOLN
162.4000 mg | Freq: Once | INTRAVENOUS | Status: AC
  Administered 2023-05-08: 160 mg via INTRAVENOUS
  Filled 2023-05-08: qty 16

## 2023-05-08 MED ORDER — PALONOSETRON HCL INJECTION 0.25 MG/5ML
0.2500 mg | Freq: Once | INTRAVENOUS | Status: AC
  Administered 2023-05-08: 0.25 mg via INTRAVENOUS
  Filled 2023-05-08: qty 5

## 2023-05-08 MED ORDER — DIPHENHYDRAMINE HCL 50 MG/ML IJ SOLN
25.0000 mg | Freq: Once | INTRAMUSCULAR | Status: AC
  Administered 2023-05-08: 25 mg via INTRAVENOUS

## 2023-05-08 MED ORDER — DEXAMETHASONE SODIUM PHOSPHATE 10 MG/ML IJ SOLN
10.0000 mg | Freq: Once | INTRAMUSCULAR | Status: AC
  Administered 2023-05-08: 10 mg via INTRAVENOUS
  Filled 2023-05-08: qty 1

## 2023-05-08 NOTE — Patient Instructions (Addendum)
 [facility] - A DEPT OF [facility]  Discharge Instructions: Thank you for choosing [facility] to provide your oncology and hematology care.   If you have a lab appointment with the Cancer Center, please go directly to the Cancer Center and check in at the registration area.   Wear comfortable clothing and clothing appropriate for easy access to any Portacath or PICC line.   We strive to give you quality time with your provider. You may need to reschedule your appointment if you arrive late (15 or more minutes).  Arriving late affects you and other patients whose appointments are after yours.  Also, if you miss three or more appointments without notifying the office, you may be dismissed from the clinic at the provider's discretion.      For prescription refill requests, have your pharmacy contact our office and allow 72 hours for refills to be completed.    Today you received the following chemotherapy and/or immunotherapy agents: Paclitaxel, Carboplatin      To help prevent nausea and vomiting after your treatment, we encourage you to take your nausea medication as directed.  BELOW ARE SYMPTOMS THAT SHOULD BE REPORTED IMMEDIATELY: *FEVER GREATER THAN 100.4 F (38 C) OR HIGHER *CHILLS OR SWEATING *NAUSEA AND VOMITING THAT IS NOT CONTROLLED WITH YOUR NAUSEA MEDICATION *UNUSUAL SHORTNESS OF BREATH *UNUSUAL BRUISING OR BLEEDING *URINARY PROBLEMS (pain or burning when urinating, or frequent urination) *BOWEL PROBLEMS (unusual diarrhea, constipation, pain near the anus) TENDERNESS IN MOUTH AND THROAT WITH OR WITHOUT PRESENCE OF ULCERS (sore throat, sores in mouth, or a toothache) UNUSUAL RASH, SWELLING OR PAIN  UNUSUAL VAGINAL DISCHARGE OR ITCHING   Items with * indicate a potential emergency and should be followed up as soon as possible or go to the Emergency Department if any problems should occur.  Please show the CHEMOTHERAPY ALERT CARD or  IMMUNOTHERAPY ALERT CARD at check-in to the Emergency Department and triage nurse.  Should you have questions after your visit or need to cancel or reschedule your appointment, please contact [facility] - A DEPT OF [facility]  Dept: [phone]  and follow the prompts.  Office hours are 8:00 a.m. to 4:30 p.m. Monday - Friday. Please note that voicemails left after 4:00 p.m. may not be returned until the following business day.  We are closed weekends and major holidays. You have access to a nurse at all times for urgent questions. Please call the main number to the clinic Dept: [phone] and follow the prompts.   For any non-urgent questions, you may also contact your provider using MyChart. We now offer e-Visits for anyone 62 and older to request care online for non-urgent symptoms. For details visit [email or web address].   Also download the MyChart app! Go to the app store, search "MyChart", open the app, select , and log in with your MyChart username and password.  Paclitaxel Injection What is this medication? PACLITAXEL (PAK li TAX el) treats some types of cancer. It works by slowing down the growth of cancer cells. This medicine may be used for other purposes; ask your health care provider or pharmacist if you have questions. COMMON BRAND NAME(S): Onxol, Taxol What should I tell my care team before I take this medication? They need to know if you have any of these conditions: Heart disease Liver disease Low white blood cell levels An unusual or allergic reaction to paclitaxel, other medications,  foods, dyes, or preservatives If you or your partner are pregnant or trying to get pregnant Breast-feeding How should I use this medication? This medication is injected into a vein. It is given by your care team in a hospital or clinic setting. Talk to your care team about the use of this medication in children. While it may be given to children  for selected conditions, precautions do apply. Overdosage: If you think you have taken too much of this medicine contact a poison control center or emergency room at once. NOTE: This medicine is only for you. Do not share this medicine with others. What if I miss a dose? Keep appointments for follow-up doses. It is important not to miss your dose. Call your care team if you are unable to keep an appointment. What may interact with this medication? Do not take this medication with any of the following: Live virus vaccines Other medications may affect the way this medication works. Talk with your care team about all of the medications you take. They may suggest changes to your treatment plan to lower the risk of side effects and to make sure your medications work as intended. This list may not describe all possible interactions. Give your health care provider a list of all the medicines, herbs, non-prescription drugs, or dietary supplements you use. Also tell them if you smoke, drink alcohol, or use illegal drugs. Some items may interact with your medicine. What should I watch for while using this medication? Your condition will be monitored carefully while you are receiving this medication. You may need blood work while taking this medication. This medication may make you feel generally unwell. This is not uncommon as chemotherapy can affect healthy cells as well as cancer cells. Report any side effects. Continue your course of treatment even though you feel ill unless your care team tells you to stop. This medication can cause serious allergic reactions. To reduce the risk, your care team may give you other medications to take before receiving this one. Be sure to follow the directions from your care team. This medication may increase your risk of getting an infection. Call your care team for advice if you get a fever, chills, sore throat, or other symptoms of a cold or flu. Do not treat yourself. Try  to avoid being around people who are sick. This medication may increase your risk to bruise or bleed. Call your care team if you notice any unusual bleeding. Be careful brushing or flossing your teeth or using a toothpick because you may get an infection or bleed more easily. If you have any dental work done, tell your dentist you are receiving this medication. Talk to your care team if you may be pregnant. Serious birth defects can occur if you take this medication during pregnancy. Talk to your care team before breastfeeding. Changes to your treatment plan may be needed. What side effects may I notice from receiving this medication? Side effects that you should report to your care team as soon as possible: Allergic reactions--skin rash, itching, hives, swelling of the face, lips, tongue, or throat Heart rhythm changes--fast or irregular heartbeat, dizziness, feeling faint or lightheaded, chest pain, trouble breathing Increase in blood pressure Infection--fever, chills, cough, sore throat, wounds that don't heal, pain or trouble when passing urine, general feeling of discomfort or being unwell Low blood pressure--dizziness, feeling faint or lightheaded, blurry vision Low red blood cell level--unusual weakness or fatigue, dizziness, headache, trouble breathing Painful swelling, warmth, or redness  of the skin, blisters or sores at the infusion site Pain, tingling, or numbness in the hands or feet Slow heartbeat--dizziness, feeling faint or lightheaded, confusion, trouble breathing, unusual weakness or fatigue Unusual bruising or bleeding Side effects that usually do not require medical attention (report to your care team if they continue or are bothersome): Diarrhea Hair loss Joint pain Loss of appetite Muscle pain Nausea Vomiting This list may not describe all possible side effects. Call your doctor for medical advice about side effects. You may report side effects to FDA at  1-800-FDA-1088. Where should I keep my medication? This medication is given in a hospital or clinic. It will not be stored at home. NOTE: This sheet is a summary. It may not cover all possible information. If you have questions about this medicine, talk to your doctor, pharmacist, or health care provider.  2024 Elsevier/Gold Standard (2021-05-25 00:00:00)  Carboplatin Injection What is this medication? CARBOPLATIN (KAR boe pla tin) treats some types of cancer. It works by slowing down the growth of cancer cells. This medicine may be used for other purposes; ask your health care provider or pharmacist if you have questions. COMMON BRAND NAME(S): Paraplatin What should I tell my care team before I take this medication? They need to know if you have any of these conditions: Blood disorders Hearing problems Kidney disease Recent or ongoing radiation therapy An unusual or allergic reaction to carboplatin, cisplatin, other medications, foods, dyes, or preservatives Pregnant or trying to get pregnant Breast-feeding How should I use this medication? This medication is injected into a vein. It is given by your care team in a hospital or clinic setting. Talk to your care team about the use of this medication in children. Special care may be needed. Overdosage: If you think you have taken too much of this medicine contact a poison control center or emergency room at once. NOTE: This medicine is only for you. Do not share this medicine with others. What if I miss a dose? Keep appointments for follow-up doses. It is important not to miss your dose. Call your care team if you are unable to keep an appointment. What may interact with this medication? Medications for seizures Some antibiotics, such as amikacin, gentamicin, neomycin, streptomycin, tobramycin Vaccines This list may not describe all possible interactions. Give your health care provider a list of all the medicines, herbs,  non-prescription drugs, or dietary supplements you use. Also tell them if you smoke, drink alcohol, or use illegal drugs. Some items may interact with your medicine. What should I watch for while using this medication? Your condition will be monitored carefully while you are receiving this medication. You may need blood work while taking this medication. This medication may make you feel generally unwell. This is not uncommon, as chemotherapy can affect healthy cells as well as cancer cells. Report any side effects. Continue your course of treatment even though you feel ill unless your care team tells you to stop. In some cases, you may be given additional medications to help with side effects. Follow all directions for their use. This medication may increase your risk of getting an infection. Call your care team for advice if you get a fever, chills, sore throat, or other symptoms of a cold or flu. Do not treat yourself. Try to avoid being around people who are sick. Avoid taking medications that contain aspirin, acetaminophen, ibuprofen, naproxen, or ketoprofen unless instructed by your care team. These medications may hide a fever. Be careful  brushing or flossing your teeth or using a toothpick because you may get an infection or bleed more easily. If you have any dental work done, tell your dentist you are receiving this medication. Talk to your care team if you wish to become pregnant or think you might be pregnant. This medication can cause serious birth defects. Talk to your care team about effective forms of contraception. Do not breast-feed while taking this medication. What side effects may I notice from receiving this medication? Side effects that you should report to your care team as soon as possible: Allergic reactions--skin rash, itching, hives, swelling of the face, lips, tongue, or throat Infection--fever, chills, cough, sore throat, wounds that don't heal, pain or trouble when passing  urine, general feeling of discomfort or being unwell Low red blood cell level--unusual weakness or fatigue, dizziness, headache, trouble breathing Pain, tingling, or numbness in the hands or feet, muscle weakness, change in vision, confusion or trouble speaking, loss of balance or coordination, trouble walking, seizures Unusual bruising or bleeding Side effects that usually do not require medical attention (report to your care team if they continue or are bothersome): Hair loss Nausea Unusual weakness or fatigue Vomiting This list may not describe all possible side effects. Call your doctor for medical advice about side effects. You may report side effects to FDA at 1-800-FDA-1088. Where should I keep my medication? This medication is given in a hospital or clinic. It will not be stored at home. NOTE: This sheet is a summary. It may not cover all possible information. If you have questions about this medicine, talk to your doctor, pharmacist, or health care provider.  2024 Elsevier/Gold Standard (2021-04-27 00:00:00)

## 2023-05-09 ENCOUNTER — Ambulatory Visit

## 2023-05-09 ENCOUNTER — Encounter: Payer: Self-pay | Admitting: Internal Medicine

## 2023-05-09 ENCOUNTER — Ambulatory Visit (INDEPENDENT_AMBULATORY_CARE_PROVIDER_SITE_OTHER): Admitting: Internal Medicine

## 2023-05-09 ENCOUNTER — Ambulatory Visit
Admission: RE | Admit: 2023-05-09 | Discharge: 2023-05-09 | Disposition: A | Discharge status: Home / Self Care | Source: Ambulatory Visit | Attending: Radiation Oncology | Admitting: Radiation Oncology

## 2023-05-09 ENCOUNTER — Other Ambulatory Visit: Payer: Self-pay

## 2023-05-09 ENCOUNTER — Telehealth: Payer: Self-pay | Admitting: *Deleted

## 2023-05-09 VITALS — BP 122/74 | HR 67 | Temp 98.5°F | Ht 66.0 in | Wt 167.0 lb

## 2023-05-09 DIAGNOSIS — Z87891 Personal history of nicotine dependence: Secondary | ICD-10-CM | POA: Diagnosis not present

## 2023-05-09 DIAGNOSIS — C3411 Malignant neoplasm of upper lobe, right bronchus or lung: Secondary | ICD-10-CM | POA: Diagnosis not present

## 2023-05-09 DIAGNOSIS — Z510 Encounter for antineoplastic radiation therapy: Secondary | ICD-10-CM | POA: Diagnosis not present

## 2023-05-09 DIAGNOSIS — R52 Pain, unspecified: Secondary | ICD-10-CM | POA: Diagnosis not present

## 2023-05-09 DIAGNOSIS — R079 Chest pain, unspecified: Secondary | ICD-10-CM

## 2023-05-09 DIAGNOSIS — K5900 Constipation, unspecified: Secondary | ICD-10-CM | POA: Diagnosis not present

## 2023-05-09 DIAGNOSIS — R300 Dysuria: Secondary | ICD-10-CM

## 2023-05-09 LAB — RAD ONC ARIA SESSION SUMMARY
Course Elapsed Days: 20
Plan Fractions Treated to Date: 3
Plan Prescribed Dose Per Fraction: 1.8 Gy
Plan Total Fractions Prescribed: 17
Plan Total Prescribed Dose: 30.6 Gy
Reference Point Dosage Given to Date: 5.4 Gy
Reference Point Session Dosage Given: 1.8 Gy
Session Number: 11

## 2023-05-09 MED ORDER — KETOROLAC TROMETHAMINE 30 MG/ML IJ SOLN
30.0000 mg | Freq: Once | INTRAMUSCULAR | Status: AC
Start: 2023-05-09 — End: 2023-05-09
  Administered 2023-05-09: 30 mg via INTRAMUSCULAR

## 2023-05-09 NOTE — Assessment & Plan Note (Signed)
 Mild to mod, for UA and Cx, to f/u any worsening symptoms or concerns

## 2023-05-09 NOTE — Assessment & Plan Note (Signed)
 With mild worsening, for miralax  17 gm every day prn

## 2023-05-09 NOTE — Telephone Encounter (Signed)
 Called & left message for pt to return call to let us  know how she is doing post chemo.

## 2023-05-09 NOTE — Patient Instructions (Addendum)
 You had the pain shot today - toradol  30 mg  Your left ear is cleared of wax today  Please continue all other medications as before, and refills have been done if requested.  Please have the pharmacy call with any other refills you may need.  Please keep your appointments with your specialists as you may have planned  Please go to the LAB at the blood drawing area for the tests to be done- just the urine testing today  You will be contacted by phone if any changes need to be made immediately.  Otherwise, you will receive a letter about your results with an explanation, but please check with MyChart first.  Please make an Appointment to return in 3 months, or sooner if needed  Ok to cancel the May 9 appt if you like

## 2023-05-09 NOTE — Assessment & Plan Note (Addendum)
 D/w pt, agree with hydrocodone  10's prn asd, but still has pills at home, and should d/w Dr Eloise Hake for next refill, though I could assume rx if Dr Eloise Hake involvement ends with end of any radiotherapy; also for toradol  30 mg IM today, as she responds well to this

## 2023-05-09 NOTE — Progress Notes (Signed)
 Patient ID: [name], female   DOB: 04/18/1945, 78 y.o.   MRN: [id number]        Chief Complaint: follow up with daughter for support, chest pain, constipation, dysuria       HPI:  [name] is a 78 y.o. female here with persistent recurring right sided malignant chest pain, improved with hydrocodone  10 mg's prn and has pills at home from recent fill, but just wanted to make sure she did not run out  Pt denies increased sob or doe, wheezing, orthopnea, PND, increased LE swelling, palpitations, dizziness or syncope.   Pt denies polydipsia, polyuria, or new focal neuro s/s.    Denies worsening reflux, abd pain, dysphagia, n/v, or blood but has mild constipation with the narcotic.   Also has worsening mild dysuria for several days, but Denies urinary symptoms such as frequency, urgency, flank pain, hematuria or n/v.    Started first dose chemo yesterday or right lung cancer Wt Readings from Last 3 Encounters:  05/09/23 167 lb (75.8 kg)  05/08/23 166 lb 12 oz (75.6 kg)  04/27/23 166 lb 8 oz (75.5 kg)   BP Readings from Last 3 Encounters:  05/09/23 122/74  05/08/23 123/71  04/27/23 102/64         Past Medical History:  Diagnosis Date   AAA (abdominal aortic aneurysm) (HCC)    a. s/p stent graft repair 01/2016.   Acute ischemic colitis (HCC) 07/29/2010   Diagnosed June, 2012 characterized by acute lower GI bleeding, spontaneously resolved.    Acute respiratory infection 06/09/2014   Acute sinus infection 05/14/2015   Allergic rhinitis, cause unspecified    Angioedema of lips 06/03/2014   Anxiety state, unspecified    Arthritis of left hip 04/16/2015   Bilateral hearing loss 07/19/2017   Chronic LBP    Coronary artery disease    a. inferior MI 1998 s/p PCI of RCA. b. stenting of Cx 04/2010. c. DES to prox LAD 05/2013. d. DES x 3 in 10/2015. // Myoview  07/2018:  EF 59, normal perfusion, low risk    Coronary artery disease involving native heart without angina pectoris 07/31/2006   Qualifier:  Diagnosis of  By: Edmonia Gottron MD, Beauford Bounds  ANGIOGRAPHIC DATA:  1. Ventriculography done in the RAO projection reveals a small wall  motion abnormality in the mid inferior wall. Ejection fraction  would be estimated at around 50%.  2. The right coronary artery has some progressive disease of about 60-  80% in the proximal mid segment. The distal vessel appears to be  50-70% narrowing althou   Degeneration of lumbar or lumbosacral intervertebral disc    Depressive disorder, not elsewhere classified    Diabetes (HCC) 11/03/2006   Qualifier: Diagnosis of  By: Autry Legions MD, Alveda Aures    Diabetes mellitus    TYPE II   Frequent urination 05/08/2014   Gout 08/22/2013   History of endovascular stent graft for abdominal aortic aneurysm (AAA) 01/22/2016   Hyperlipidemia    Hypertension    Hypothyroidism    Iron  deficiency anemia 06/13/2017   Lower GI bleed 06/2010   Diverticular bleed   Lumbar radiculopathy 05/15/2015   MENOPAUSAL DISORDER 10/29/2007   Qualifier: Diagnosis of  By: Autry Legions MD, Alveda Aures    Myalgia 09/25/2013   Noncompliance with medications 02/26/2014   Obesity, unspecified    OTITIS MEDIA, SEROUS, CHRONIC 04/23/2007   Qualifier: Diagnosis of  By: Edmonia Gottron MD, Beauford Bounds    Thoracic disc disease 11/19/2018  URTICARIA 09/23/2009   Qualifier: Diagnosis of  By: Myrtice Athens MD, Tarri Farm A    Venous insufficiency 07/19/2017   Past Surgical History:  Procedure Laterality Date   CARDIAC CATHETERIZATION     PCI OF BOTH THE CIRCUMFLEX AND LEFT ANTERIOR DESCENDING ARTERY   CARDIAC CATHETERIZATION N/A 10/29/2015   Procedure: Coronary Stent Intervention;  Surgeon: Sammy Crisp, MD;  Location: [facility] INVASIVE CV LAB;  Service: Cardiovascular;  Laterality: N/A;   CARDIAC CATHETERIZATION N/A 10/29/2015   Procedure: Coronary/Graft Angiography;  Surgeon: Sammy Crisp, MD;  Location: [facility] INVASIVE CV [facility];  Service: Cardiovascular;  Laterality: N/A;   CARDIAC CATHETERIZATION N/A 10/29/2015   Procedure: Intravascular Pressure  Wire/FFR Study;  Surgeon: Sammy Crisp, MD;  Location: [facility] INVASIVE CV LAB;  Service: Cardiovascular;  Laterality: N/A;   CESAREAN SECTION     ENDOVASCULAR STENT INSERTION N/A 01/22/2016   Procedure: ABDOMINAL AORTIC ENDOVASCULAR STENT GRAFT INSERTION;  Surgeon: Mayo Speck, MD;  Location: [facility] OR;  Service: Vascular;  Laterality: N/A;   HEART STENT  04-2010  and  Jun 07, 2013   X 3   LEFT HEART CATH AND CORONARY ANGIOGRAPHY N/A 04/03/2019   Procedure: LEFT HEART CATH AND CORONARY ANGIOGRAPHY;  Surgeon: Sammy Crisp, MD;  Location: [facility] INVASIVE CV [facility];  Service: Cardiovascular;  Laterality: N/A;   LEFT HEART CATHETERIZATION WITH CORONARY ANGIOGRAM N/A 06/07/2013   Procedure: LEFT HEART CATHETERIZATION WITH CORONARY ANGIOGRAM;  Surgeon: Odie Benne, MD;  Location: [facility] CATH LAB;  Service: Cardiovascular;  Laterality: N/A;   LEFT HEART CATHETERIZATION WITH CORONARY ANGIOGRAM N/A 02/25/2014   Procedure: LEFT HEART CATHETERIZATION WITH CORONARY ANGIOGRAM;  Surgeon: Millicent Ally, MD;  Location: [facility] CATH LAB;  Service: Cardiovascular;  Laterality: N/A;   LUMBAR FUSION  01/2007   DR. Faylene Hoots...3-LEVEL WITH FIXATION   OOPHORECTOMY     BSO? pt.unsure   PARATHYROIDECTOMY     SPINE SURGERY     THYROIDECTOMY     TOTAL ABDOMINAL HYSTERECTOMY      reports that she quit smoking about 36 years ago. Her smoking use included cigarettes. She started smoking about 66 years ago. She has never used smokeless tobacco. She reports that she does not drink alcohol and does not use drugs. family history includes Arthritis in her maternal aunt; Breast cancer in her maternal aunt; Diabetes in her brother and maternal aunt; Heart attack (age of onset: 70) in her mother; Heart attack (age of onset: 50) in her father; Heart disease in her father and mother. Allergies  Allergen Reactions   Crestor  [Rosuvastatin  Calcium ] Other (See Comments)    Feeling poor   Prilosec [Omeprazole ] Other (See Comments)    Chest pain    Miconazole Nitrate Hives    REACTION: hives   Augmentin  [Amoxicillin -Pot Clavulanate] Hives, Itching and Rash    Has patient had a PCN reaction causing immediate rash, facial/tongue/throat swelling, SOB or lightheadedness with hypotension:unsure Has patient had a PCN reaction causing severe rash involving mucus membranes or skin necrosis:unsure Has patient had a PCN reaction that required hospitalization:No Has patient had a PCN reaction occurring within the last 10 years:NO If all of the above answers are "NO", then may proceed with Cephalosporin use. Has patient had a PCN reaction causing immediate rash, facial/tongue/throat swelling, SOB or lightheadedness with hypotension:unsure Has patient had a PCN reaction causing severe rash involving mucus membranes or skin necrosis:unsure Has patient had a PCN reaction that required hospitalization:No Has patient had a PCN reaction occurring within the last 10 years:NO If  all of the above answers are "NO", then may proceed with Cephalosporin use.    Doxycycline  Other (See Comments)    REACTION: gi upset   Current Outpatient Medications on File Prior to Visit  Medication Sig Dispense Refill   aspirin  81 MG EC tablet Take 81 mg by mouth daily.      atenolol  (TENORMIN ) 25 MG tablet TAKE 1 TABLET BY MOUTH EVERY DAY. 90 tablet 1   Foot Care Products ([employer] GEL HEEL CUSHION WOMENS) PADS Use as directed at bedtime 2 each 5   gabapentin  (NEURONTIN ) 300 MG capsule Take 1-2 tab by mouth at bedtime for pain and sleep (Patient taking differently: Take 300 mg by mouth as needed. Take 1-2 tab by mouth at bedtime for pain and sleep) 180 capsule 1   HYDROcodone -acetaminophen  (NORCO) 10-325 MG tablet Take 1 tablet by mouth every 8 (eight) hours as needed. 60 tablet 0   isosorbide  mononitrate (IMDUR ) 30 MG 24 hr tablet Take 0.5 tablets (15 mg total) by mouth daily. 30 tablet 0   levofloxacin  (LEVAQUIN ) 500 MG tablet Take 1 tablet (500 mg total) by mouth daily. 10  tablet 0   lidocaine -prilocaine  (EMLA ) cream Apply 1 Application topically as needed. 30 g 2   phenazopyridine  (PYRIDIUM ) 200 MG tablet Take 1 tablet (200 mg total) by mouth 3 (three) times daily as needed for pain (with urination). 25 tablet 0   prochlorperazine  (COMPAZINE ) 10 MG tablet Take 1 tablet (10 mg total) by mouth every 6 (six) hours as needed. 30 tablet 2   temazepam  (RESTORIL ) 15 MG capsule Take 1 capsule (15 mg total) by mouth at bedtime as needed for sleep. 30 capsule 2   Triamcinolone  Acetonide (ZILRETTA ) 32 MG SRER intra-articular injection 5 mL, intra-articular, bilat knees 2 each 0   atorvastatin  (LIPITOR ) 40 MG tablet Take 1 tablet (40 mg total) by mouth daily. (Patient not taking: Reported on 03/21/2023) 90 tablet 3   fluconazole  (DIFLUCAN ) 150 MG tablet Take one tab by mouth every 3 days as needed (Patient not taking: Reported on 05/09/2023) 2 tablet 1   levothyroxine  (SYNTHROID ) 100 MCG tablet Take 1 tablet (100 mcg total) by mouth daily before breakfast. 30 tablet 0   No current facility-administered medications on file prior to visit.        ROS:  All others reviewed and negative.  Objective        PE:  BP 122/74 (BP Location: Right Arm, Patient Position: Sitting, Cuff Size: Normal)   Pulse 67   Temp 98.5 F (36.9 C) (Oral)   Ht 5\' 6"  (1.676 m)   Wt 167 lb (75.8 kg)   SpO2 98%   BMI 26.95 kg/m                 Constitutional: Pt appears in NAD               HENT: Head: NCAT.                Right Ear: External ear normal.                 Left Ear: External ear normal.                Eyes: . Pupils are equal, round, and reactive to light. Conjunctivae and EOM are normal               Nose: without d/c or deformity  Neck: Neck supple. Gross normal ROM               Cardiovascular: Normal rate and regular rhythm.                 Pulmonary/Chest: Effort normal and breath sounds without rales or wheezing.                Abd:  Soft, NT, ND, + BS, no  organomegaly               Neurological: Pt is alert. At baseline orientation, motor grossly intact               Skin: Skin is warm. No rashes, no other new lesions, LE edema - none               Psychiatric: Pt behavior is normal without agitation   Micro: none  Cardiac tracings I have personally interpreted today:  none  Pertinent Radiological findings (summarize): none   Lab Results  Component Value Date   WBC 5.8 05/08/2023   HGB 11.5 (L) 05/08/2023   HCT 35.3 (L) 05/08/2023   PLT 400 05/08/2023   GLUCOSE 88 05/08/2023   CHOL 119 03/22/2023   TRIG 62 03/22/2023   HDL 22 (L) 03/22/2023   LDLDIRECT 131.0 03/27/2015   LDLCALC 85 03/22/2023   ALT 6 05/08/2023   AST 10 (L) 05/08/2023   NA 140 05/08/2023   K 3.6 05/08/2023   CL 104 05/08/2023   CREATININE 0.72 05/08/2023   BUN 5 (L) 05/08/2023   CO2 29 05/08/2023   TSH 0.067 (L) 03/22/2023   INR 1.2 03/27/2023   HGBA1C 5.5 02/21/2023   MICROALBUR <0.7 02/21/2023   Assessment/Plan:  [name] is a 78 y.o. Black or African American [2] female with  has a past medical history of AAA (abdominal aortic aneurysm) (HCC), Acute ischemic colitis (HCC) (07/29/2010), Acute respiratory infection (06/09/2014), Acute sinus infection (05/14/2015), Allergic rhinitis, cause unspecified, Angioedema of lips (06/03/2014), Anxiety state, unspecified, Arthritis of left hip (04/16/2015), Bilateral hearing loss (07/19/2017), Chronic LBP, Coronary artery disease, Coronary artery disease involving native heart without angina pectoris (07/31/2006), Degeneration of lumbar or lumbosacral intervertebral disc, Depressive disorder, not elsewhere classified, Diabetes (HCC) (11/03/2006), Diabetes mellitus, Frequent urination (05/08/2014), Gout (08/22/2013), History of endovascular stent graft for abdominal aortic aneurysm (AAA) (01/22/2016), Hyperlipidemia, Hypertension, Hypothyroidism, Iron  deficiency anemia (06/13/2017), Lower GI bleed (06/2010), Lumbar radiculopathy  (05/15/2015), MENOPAUSAL DISORDER (10/29/2007), Myalgia (09/25/2013), Noncompliance with medications (02/26/2014), Obesity, unspecified, OTITIS MEDIA, SEROUS, CHRONIC (04/23/2007), Thoracic disc disease (11/19/2018), URTICARIA (09/23/2009), and Venous insufficiency (07/19/2017).  Chest pain D/w pt, agree with hydrocodone  10's prn asd, but still has pills at home, and should d/w Dr Eloise Hake for next refill, though I could assume rx if Dr Eloise Hake involvement ends with end of any radiotherapy; also for toradol  30 mg IM today, as she responds well to this  Dysuria Mild to mod, for UA and Cx, to f/u any worsening symptoms or concerns  Constipation With mild worsening, for miralax  17 gm every day prn  Followup: Return in about 3 months (around 08/08/2023).  Rosalia Colonel, MD 05/09/2023 8:57 PM [facility]

## 2023-05-10 ENCOUNTER — Ambulatory Visit: Admitting: Internal Medicine

## 2023-05-10 ENCOUNTER — Other Ambulatory Visit: Payer: Self-pay

## 2023-05-10 ENCOUNTER — Ambulatory Visit
Admission: RE | Admit: 2023-05-10 | Discharge: 2023-05-10 | Disposition: A | Discharge status: Home / Self Care | Source: Ambulatory Visit | Attending: Radiation Oncology | Admitting: Radiation Oncology

## 2023-05-10 DIAGNOSIS — C3411 Malignant neoplasm of upper lobe, right bronchus or lung: Secondary | ICD-10-CM | POA: Diagnosis not present

## 2023-05-10 LAB — RAD ONC ARIA SESSION SUMMARY
Course Elapsed Days: 21
Plan Fractions Treated to Date: 4
Plan Prescribed Dose Per Fraction: 1.8 Gy
Plan Total Fractions Prescribed: 17
Plan Total Prescribed Dose: 30.6 Gy
Reference Point Dosage Given to Date: 7.2 Gy
Reference Point Session Dosage Given: 1.8 Gy
Session Number: 12

## 2023-05-11 ENCOUNTER — Encounter: Payer: Self-pay | Admitting: Internal Medicine

## 2023-05-11 ENCOUNTER — Ambulatory Visit

## 2023-05-11 ENCOUNTER — Ambulatory Visit
Admission: RE | Admit: 2023-05-11 | Discharge: 2023-05-11 | Discharge status: Home / Self Care | Source: Ambulatory Visit | Attending: Radiation Oncology | Admitting: Radiation Oncology

## 2023-05-11 ENCOUNTER — Other Ambulatory Visit: Payer: Self-pay

## 2023-05-11 DIAGNOSIS — Z510 Encounter for antineoplastic radiation therapy: Secondary | ICD-10-CM | POA: Diagnosis not present

## 2023-05-11 DIAGNOSIS — R300 Dysuria: Secondary | ICD-10-CM | POA: Diagnosis not present

## 2023-05-11 DIAGNOSIS — Z87891 Personal history of nicotine dependence: Secondary | ICD-10-CM | POA: Diagnosis not present

## 2023-05-11 DIAGNOSIS — C3411 Malignant neoplasm of upper lobe, right bronchus or lung: Secondary | ICD-10-CM | POA: Diagnosis not present

## 2023-05-11 LAB — URINALYSIS, ROUTINE W REFLEX MICROSCOPIC
Bilirubin Urine: NEGATIVE
Hgb urine dipstick: NEGATIVE
Ketones, ur: NEGATIVE
Leukocytes,Ua: NEGATIVE
Nitrite: NEGATIVE
RBC / HPF: NONE SEEN (ref 0–?)
Specific Gravity, Urine: 1.01 (ref 1.000–1.030)
Total Protein, Urine: NEGATIVE
Urine Glucose: NEGATIVE
Urobilinogen, UA: 1 (ref 0.0–1.0)
pH: 6 (ref 5.0–8.0)

## 2023-05-11 LAB — URINE CULTURE

## 2023-05-11 LAB — RAD ONC ARIA SESSION SUMMARY
Course Elapsed Days: 22
Plan Fractions Treated to Date: 5
Plan Prescribed Dose Per Fraction: 1.8 Gy
Plan Total Fractions Prescribed: 17
Plan Total Prescribed Dose: 30.6 Gy
Reference Point Dosage Given to Date: 9 Gy
Reference Point Session Dosage Given: 1.8 Gy
Session Number: 13

## 2023-05-11 LAB — ACID FAST CULTURE WITH REFLEXED SENSITIVITIES (MYCOBACTERIA): Acid Fast Culture: NEGATIVE

## 2023-05-12 ENCOUNTER — Other Ambulatory Visit: Payer: Self-pay

## 2023-05-12 ENCOUNTER — Ambulatory Visit
Admission: RE | Admit: 2023-05-12 | Discharge: 2023-05-12 | Disposition: A | Discharge status: Home / Self Care | Source: Ambulatory Visit | Attending: Radiation Oncology | Admitting: Radiation Oncology

## 2023-05-12 DIAGNOSIS — C3411 Malignant neoplasm of upper lobe, right bronchus or lung: Secondary | ICD-10-CM | POA: Diagnosis not present

## 2023-05-12 DIAGNOSIS — R300 Dysuria: Secondary | ICD-10-CM | POA: Diagnosis not present

## 2023-05-12 DIAGNOSIS — Z87891 Personal history of nicotine dependence: Secondary | ICD-10-CM | POA: Diagnosis not present

## 2023-05-12 DIAGNOSIS — Z510 Encounter for antineoplastic radiation therapy: Secondary | ICD-10-CM | POA: Diagnosis not present

## 2023-05-12 LAB — RAD ONC ARIA SESSION SUMMARY
Course Elapsed Days: 23
Plan Fractions Treated to Date: 6
Plan Prescribed Dose Per Fraction: 1.8 Gy
Plan Total Fractions Prescribed: 17
Plan Total Prescribed Dose: 30.6 Gy
Reference Point Dosage Given to Date: 10.8 Gy
Reference Point Session Dosage Given: 1.8 Gy
Session Number: 14

## 2023-05-15 ENCOUNTER — Other Ambulatory Visit

## 2023-05-15 ENCOUNTER — Ambulatory Visit
Admission: RE | Admit: 2023-05-15 | Discharge: 2023-05-15 | Disposition: A | Discharge status: Home / Self Care | Source: Ambulatory Visit | Attending: Radiation Oncology | Admitting: Radiation Oncology

## 2023-05-15 ENCOUNTER — Inpatient Hospital Stay

## 2023-05-15 ENCOUNTER — Inpatient Hospital Stay (HOSPITAL_BASED_OUTPATIENT_CLINIC_OR_DEPARTMENT_OTHER): Admitting: Internal Medicine

## 2023-05-15 ENCOUNTER — Other Ambulatory Visit: Payer: Self-pay

## 2023-05-15 VITALS — BP 104/68 | HR 65 | Temp 97.8°F | Resp 16 | Ht 66.0 in | Wt 170.6 lb

## 2023-05-15 DIAGNOSIS — Z510 Encounter for antineoplastic radiation therapy: Secondary | ICD-10-CM | POA: Diagnosis not present

## 2023-05-15 DIAGNOSIS — Z87891 Personal history of nicotine dependence: Secondary | ICD-10-CM | POA: Diagnosis not present

## 2023-05-15 DIAGNOSIS — R300 Dysuria: Secondary | ICD-10-CM | POA: Diagnosis not present

## 2023-05-15 DIAGNOSIS — C3411 Malignant neoplasm of upper lobe, right bronchus or lung: Secondary | ICD-10-CM

## 2023-05-15 LAB — CMP (CANCER CENTER ONLY)
ALT: 6 U/L (ref 0–44)
AST: 12 U/L — ABNORMAL LOW (ref 15–41)
Albumin: 3 g/dL — ABNORMAL LOW (ref 3.5–5.0)
Alkaline Phosphatase: 82 U/L (ref 38–126)
Anion gap: 4 — ABNORMAL LOW (ref 5–15)
BUN: 5 mg/dL — ABNORMAL LOW (ref 8–23)
CO2: 31 mmol/L (ref 22–32)
Calcium: 8.1 mg/dL — ABNORMAL LOW (ref 8.9–10.3)
Chloride: 106 mmol/L (ref 98–111)
Creatinine: 0.65 mg/dL (ref 0.44–1.00)
GFR, Estimated: >60 mL/min (ref >60)
Glucose, Bld: 85 mg/dL (ref 70–99)
Potassium: 3.9 mmol/L (ref 3.5–5.1)
Sodium: 141 mmol/L (ref 135–145)
Total Bilirubin: 0.3 mg/dL (ref 0.0–1.2)
Total Protein: 7.1 g/dL (ref 6.5–8.1)

## 2023-05-15 LAB — RAD ONC ARIA SESSION SUMMARY
Course Elapsed Days: 26
Plan Fractions Treated to Date: 7
Plan Prescribed Dose Per Fraction: 1.8 Gy
Plan Total Fractions Prescribed: 17
Plan Total Prescribed Dose: 30.6 Gy
Reference Point Dosage Given to Date: 12.6 Gy
Reference Point Session Dosage Given: 1.8 Gy
Session Number: 15

## 2023-05-15 LAB — CBC WITH DIFFERENTIAL (CANCER CENTER ONLY)
Abs Immature Granulocytes: 0.04 10*3/uL (ref 0.00–0.07)
Basophils Absolute: 0 10*3/uL (ref 0.0–0.1)
Basophils Relative: 0 %
Eosinophils Absolute: 0.1 10*3/uL (ref 0.0–0.5)
Eosinophils Relative: 2 %
HCT: 35.8 % — ABNORMAL LOW (ref 36.0–46.0)
Hemoglobin: 11.4 g/dL — ABNORMAL LOW (ref 12.0–15.0)
Immature Granulocytes: 1 %
Lymphocytes Relative: 17 %
Lymphs Abs: 0.8 10*3/uL (ref 0.7–4.0)
MCH: 26.8 pg (ref 26.0–34.0)
MCHC: 31.8 g/dL (ref 30.0–36.0)
MCV: 84 fL (ref 80.0–100.0)
Monocytes Absolute: 0.3 10*3/uL (ref 0.1–1.0)
Monocytes Relative: 6 %
Neutro Abs: 3.6 10*3/uL (ref 1.7–7.7)
Neutrophils Relative %: 74 %
Platelet Count: 302 10*3/uL (ref 150–400)
RBC: 4.26 MIL/uL (ref 3.87–5.11)
RDW: 16.4 % — ABNORMAL HIGH (ref 11.5–15.5)
WBC Count: 4.9 10*3/uL (ref 4.0–10.5)
nRBC: 0 % (ref 0.0–0.2)

## 2023-05-15 NOTE — Progress Notes (Signed)
 [name] [phone]) [phone]   [phone]) [phone]  OFFICE PROGRESS NOTE  Brandi Coombe, MD [address] [place] [address]  DIAGNOSIS: Stage IIIB (T4, N2b, M0) non-small cell lung cancer, squamous cell carcinoma. She presented with large pleural-based peripheral right upper lobe lung mass measuring up to 8.2 cm in size with large right paratracheal node with low level hypermetabolic uptake.  Diagnosed in March 2025.   PDL1: 15%   PRIOR THERAPY: None   CURRENT THERAPY: concurrent chemoradiation with carboplatin  for an AUC of 2 and taxol  45 mg/m2. First dose expected on 05/08/23.  Status post 1 cycle  INTERVAL HISTORY: [name] 78 y.o. [name] returns to the clinic today for follow-up visit.Discussed the use of AI scribe software for clinical note transcription with the patient, who gave verbal consent to proceed.  History of Present Illness   [name] is a 78 year old [name] who presents for evaluation before starting the second week of chemotherapy treatment.  The first week of chemotherapy was well-tolerated without significant side effects. She did not experience nausea, vomiting, or diarrhea. However, she reports feeling cold all the time.  She takes pain medication as needed for pain relief. She has no current chest pain, breathing issues, or new cough.  Her lab work is adequate for treatment today, although there was confusion about whether chemotherapy was scheduled for today or tomorrow.       MEDICAL HISTORY: Past Medical History:  Diagnosis Date   AAA (abdominal aortic aneurysm) (HCC)    a. s/p stent graft repair 01/2016.   Acute ischemic colitis (HCC) 07/29/2010   Diagnosed June, 2012 characterized by acute lower GI bleeding, spontaneously resolved.    Acute respiratory infection 06/09/2014   Acute sinus infection 05/14/2015   Allergic rhinitis, cause unspecified    Angioedema of lips 06/03/2014   Anxiety state, unspecified     Arthritis of left hip 04/16/2015   Bilateral hearing loss 07/19/2017   Chronic LBP    Coronary artery disease    a. inferior MI 1998 s/p PCI of RCA. b. stenting of Cx 04/2010. c. DES to prox LAD 05/2013. d. DES x 3 in 10/2015. // Myoview  07/2018:  EF 59, normal perfusion, low risk    Coronary artery disease involving native heart without angina pectoris 07/31/2006   Qualifier: Diagnosis of  By: Edmonia Gottron MD, Beauford Bounds  ANGIOGRAPHIC DATA:  1. Ventriculography done in the RAO projection reveals a small wall  motion abnormality in the mid inferior wall. Ejection fraction  would be estimated at around 50%.  2. The right coronary artery has some progressive disease of about 60-  80% in the proximal mid segment. The distal vessel appears to be  50-70% narrowing althou   Degeneration of lumbar or lumbosacral intervertebral disc    Depressive disorder, not elsewhere classified    Diabetes (HCC) 11/03/2006   Qualifier: Diagnosis of  By: Autry Legions MD, Alveda Aures    Diabetes mellitus    TYPE II   Frequent urination 05/08/2014   Gout 08/22/2013   History of endovascular stent graft for abdominal aortic aneurysm (AAA) 01/22/2016   Hyperlipidemia    Hypertension    Hypothyroidism    Iron  deficiency anemia 06/13/2017   Lower GI bleed 06/2010   Diverticular bleed   Lumbar radiculopathy 05/15/2015   MENOPAUSAL DISORDER 10/29/2007   Qualifier: Diagnosis of  By: Autry Legions MD, Alveda Aures    Myalgia 09/25/2013  Noncompliance with medications 02/26/2014   Obesity, unspecified    OTITIS MEDIA, SEROUS, CHRONIC 04/23/2007   Qualifier: Diagnosis of  By: Edmonia Gottron MD, Beauford Bounds    Thoracic disc disease 11/19/2018   URTICARIA 09/23/2009   Qualifier: Diagnosis of  By: Myrtice Athens MD, Tarri Farm A    Venous insufficiency 07/19/2017    ALLERGIES:  is allergic to crestor  [rosuvastatin  calcium ], prilosec [omeprazole ], miconazole nitrate, augmentin  [amoxicillin -pot clavulanate], and doxycycline .  MEDICATIONS:  Current Outpatient Medications  Medication Sig  Dispense Refill   aspirin  81 MG EC tablet Take 81 mg by mouth daily.      atenolol  (TENORMIN ) 25 MG tablet TAKE 1 TABLET BY MOUTH EVERY DAY. 90 tablet 1   atorvastatin  (LIPITOR ) 40 MG tablet Take 1 tablet (40 mg total) by mouth daily. (Patient not taking: Reported on 03/21/2023) 90 tablet 3   fluconazole  (DIFLUCAN ) 150 MG tablet Take one tab by mouth every 3 days as needed (Patient not taking: Reported on 05/09/2023) 2 tablet 1   Foot Care Products ([employer] GEL HEEL CUSHION WOMENS) PADS Use as directed at bedtime 2 each 5   gabapentin  (NEURONTIN ) 300 MG capsule Take 1-2 tab by mouth at bedtime for pain and sleep (Patient taking differently: Take 300 mg by mouth as needed. Take 1-2 tab by mouth at bedtime for pain and sleep) 180 capsule 1   HYDROcodone -acetaminophen  (NORCO) 10-325 MG tablet Take 1 tablet by mouth every 8 (eight) hours as needed. 60 tablet 0   isosorbide  mononitrate (IMDUR ) 30 MG 24 hr tablet Take 0.5 tablets (15 mg total) by mouth daily. 30 tablet 0   levofloxacin  (LEVAQUIN ) 500 MG tablet Take 1 tablet (500 mg total) by mouth daily. 10 tablet 0   levothyroxine  (SYNTHROID ) 100 MCG tablet Take 1 tablet (100 mcg total) by mouth daily before breakfast. 30 tablet 0   lidocaine -prilocaine  (EMLA ) cream Apply 1 Application topically as needed. 30 g 2   phenazopyridine  (PYRIDIUM ) 200 MG tablet Take 1 tablet (200 mg total) by mouth 3 (three) times daily as needed for pain (with urination). 25 tablet 0   prochlorperazine  (COMPAZINE ) 10 MG tablet Take 1 tablet (10 mg total) by mouth every 6 (six) hours as needed. 30 tablet 2   temazepam  (RESTORIL ) 15 MG capsule Take 1 capsule (15 mg total) by mouth at bedtime as needed for sleep. 30 capsule 2   Triamcinolone  Acetonide (ZILRETTA ) 32 MG SRER intra-articular injection 5 mL, intra-articular, bilat knees 2 each 0   No current facility-administered medications for this visit.    SURGICAL HISTORY:  Past Surgical History:  Procedure Laterality Date    CARDIAC CATHETERIZATION     PCI OF BOTH THE CIRCUMFLEX AND LEFT ANTERIOR DESCENDING ARTERY   CARDIAC CATHETERIZATION N/A 10/29/2015   Procedure: Coronary Stent Intervention;  Surgeon: Sammy Crisp, MD;  Location: [facility] INVASIVE CV LAB;  Service: Cardiovascular;  Laterality: N/A;   CARDIAC CATHETERIZATION N/A 10/29/2015   Procedure: Coronary/Graft Angiography;  Surgeon: Sammy Crisp, MD;  Location: [facility] INVASIVE CV LAB;  Service: Cardiovascular;  Laterality: N/A;   CARDIAC CATHETERIZATION N/A 10/29/2015   Procedure: Intravascular Pressure Wire/FFR Study;  Surgeon: Sammy Crisp, MD;  Location: [facility] INVASIVE CV LAB;  Service: Cardiovascular;  Laterality: N/A;   CESAREAN SECTION     ENDOVASCULAR STENT INSERTION N/A 01/22/2016   Procedure: ABDOMINAL AORTIC ENDOVASCULAR STENT GRAFT INSERTION;  Surgeon: Mayo Speck, MD;  Location: [facility] OR;  Service: Vascular;  Laterality: N/A;   HEART STENT  04-2010  and  Jun 07, 2013   X 3   LEFT HEART CATH AND CORONARY ANGIOGRAPHY N/A 04/03/2019   Procedure: LEFT HEART CATH AND CORONARY ANGIOGRAPHY;  Surgeon: Sammy Crisp, MD;  Location: [facility] INVASIVE CV LAB;  Service: Cardiovascular;  Laterality: N/A;   LEFT HEART CATHETERIZATION WITH CORONARY ANGIOGRAM N/A 06/07/2013   Procedure: LEFT HEART CATHETERIZATION WITH CORONARY ANGIOGRAM;  Surgeon: Odie Benne, MD;  Location: [facility] CATH LAB;  Service: Cardiovascular;  Laterality: N/A;   LEFT HEART CATHETERIZATION WITH CORONARY ANGIOGRAM N/A 02/25/2014   Procedure: LEFT HEART CATHETERIZATION WITH CORONARY ANGIOGRAM;  Surgeon: Millicent Ally, MD;  Location: [facility] CATH LAB;  Service: Cardiovascular;  Laterality: N/A;   LUMBAR FUSION  01/2007   DR. Faylene Hoots...3-LEVEL WITH FIXATION   OOPHORECTOMY     BSO? pt.unsure   PARATHYROIDECTOMY     SPINE SURGERY     THYROIDECTOMY     TOTAL ABDOMINAL HYSTERECTOMY      REVIEW OF SYSTEMS:  A comprehensive review of systems was negative except for: Constitutional: positive for fatigue    PHYSICAL EXAMINATION: General appearance: alert, cooperative, fatigued, and no distress Head: Normocephalic, without obvious abnormality, atraumatic Neck: no adenopathy, no JVD, supple, symmetrical, trachea midline, and thyroid  not enlarged, symmetric, no tenderness/mass/nodules Lymph nodes: Cervical, supraclavicular, and axillary nodes normal. Resp: clear to auscultation bilaterally Back: symmetric, no curvature. ROM normal. No CVA tenderness. Cardio: regular rate and rhythm, S1, S2 normal, no murmur, click, rub or gallop GI: soft, non-tender; bowel sounds normal; no masses,  no organomegaly Extremities: extremities normal, atraumatic, no cyanosis or edema  ECOG PERFORMANCE STATUS: 1 - Symptomatic but completely ambulatory  Blood pressure 104/68, pulse 65, temperature 97.8 F (36.6 C), temperature source Temporal, resp. rate 16, height 5\' 6"  (1.676 m), weight 170 lb 9.6 oz (77.4 kg), SpO2 100%.  LABORATORY DATA: Lab Results  Component Value Date   WBC 4.9 05/15/2023   HGB 11.4 (L) 05/15/2023   HCT 35.8 (L) 05/15/2023   MCV 84.0 05/15/2023   PLT 302 05/15/2023      Chemistry      Component Value Date/Time   NA 141 05/15/2023 0831   NA 141 06/08/2021 1606   K 3.9 05/15/2023 0831   CL 106 05/15/2023 0831   CO2 31 05/15/2023 0831   BUN 5 (L) 05/15/2023 0831   BUN 4 (L) 06/08/2021 1606   CREATININE 0.65 05/15/2023 0831      Component Value Date/Time   CALCIUM  8.1 (L) 05/15/2023 0831   ALKPHOS 82 05/15/2023 0831   AST 12 (L) 05/15/2023 0831   ALT 6 05/15/2023 0831   BILITOT 0.3 05/15/2023 0831       RADIOGRAPHIC STUDIES: NM PET Image Initial (PI) Skull Base To Thigh (F-18 FDG) Result Date: 04/26/2023 CLINICAL DATA:  Initial treatment strategy for non-small-cell lung cancer. EXAM: NUCLEAR MEDICINE PET SKULL BASE TO THIGH TECHNIQUE: 8.0 mCi F-18 FDG was injected intravenously. Full-ring PET imaging was performed from the skull base to thigh after the radiotracer. CT data  was obtained and used for attenuation correction and anatomic localization. Fasting blood glucose: 88 mg/dl COMPARISON:  CT chest 16/10/9602 FINDINGS: Mediastinal blood pool activity: SUV max 2.8 Liver activity: SUV max 2.9 NECK: There is some physiologic muscle uptake along the left neck and along the left vocal cord. No specific abnormal uptake seen above mediastinal blood pool in the soft tissues of neck otherwise including along lymph node change of the submandibular, posterior triangle or internal jugular region. Near symmetric uptake along the visualized portions  of the intracranial compartment. Incidental CT findings: Visualized portions of the paranasal sinuses and mastoid air cells are clear. The parotid glands, submandibular glands unremarkable. Atrophic thyroid  gland. CHEST: Lobular heterogeneous enhancement identified in the area of cavitary mass in the peripheral right upper lobe abutting the pleura. Maximum SUV value of this area of 39.6. Masslike area previously measured 8.2 x 2.6 cm and today 8.3 by 3.0 cm on series 4, image 58. There is some irregularity of the lateral aspect of the right third rib abutting this mass and may be involved. Please correlate with biopsy results. No additional areas of abnormal radiotracer uptake along the lung parenchyma. There is an enlarged right paratracheal, precarinal node on series 4, image 65 with short axis measurement 10 mm. This has minimal uptake of maximum SUV of 3.6, just above blood pool. No additional areas uptake along the axillary region, hilum or mediastinum above mediastinal blood pool. There is a focus of uptake identified with maximum SUV value of 6.5 corresponding to a focus along the posterior aspect of the right third rib separate from the lateral area. Additional area of bone involvement of tumor is possible. Incidental CT findings: Significant streak artifact related to the spinal fixation hardware. No pleural effusion or pneumothorax. Mild  bandlike changes along the right lung base, likely scar or atelectasis greater than left. Underlying emphysematous lung changes are identified. Heart is nonenlarged. Coronary artery calcifications are seen. Tortuous course to the descending thoracic aorta. No specific abnormal nodal enlargement identified in the axillary region, hilum on this noncontrast CT. ABDOMEN/PELVIS: There is physiologic distribution of radiotracer along the parenchymal organs, bowel and renal collecting systems.There is some uptake along areas of nodular thickening along the subcutaneous fat of the pelvis on the left side with maximum SUV of 7.2 and a nodular area on image 154 of 12 mm. Similar larger but more more facet on the right side. Incidental CT findings: Significant streak artifact from the spinal fixation hardware. Aortic endograft in place. Diffuse colonic stool with colonic diverticula. No bowel obstruction, free air or free fluid. Small Paddock cysts are again seen. SKELETON: Extensive hardware identified along the spine including thoracic and lumbar regions. Extensive degenerative changes identified along the spine and pelvis. Again please see chest section for areas of abnormal uptake and irregularity along the right third rib. IMPRESSION: Centrally cavitary focal soft tissue mass in the lateral right upper lobe extending out to the pleura is hypermetabolic consistent with known neoplasm. There is a associated area of irregularity and uptake along the lateral aspect of the right third rib adjacent to the tumor, likely involvement. There is a separate area of uptake separately in the posterior aspect of the right third rib as well consistent with additional area of potential disease. Borderline enlarged solitary right paratracheal node with minimal uptake above blood pool. nonspecific with the low level of uptake. Attention on follow-up. Focus of uptake along a nodule in the subcutaneous fat posterior along the pelvis on both  sides. Nonspecific but please correlate for injection sites or other process. Attention on follow-up Electronically Signed   By: Adrianna Horde M.D.   On: 04/26/2023 13:09   DG Chest Port 1 View Result Date: 04/22/2023 CLINICAL DATA:  Chest pain. EXAM: PORTABLE CHEST 1 VIEW COMPARISON:  Chest radiograph dated 03/27/2023. FINDINGS: Similar appearance of an area of pleural thickening in the right upper lobe. No new consolidation. There is no pleural effusion pneumothorax. Stable cardiac silhouette. Coronary vascular stent. No acute osseous pathology.  Spinal fusion hardware. IMPRESSION: No active disease. Electronically Signed   By: Angus Bark M.D.   On: 04/22/2023 19:16   MR BRAIN W WO CONTRAST Result Date: 04/21/2023 CLINICAL DATA:  Provided history: Malignant neoplasm of unspecified part of unspecified bronchus or lung. Non-small cell lung cancer, staging. EXAM: MRI HEAD WITHOUT AND WITH CONTRAST TECHNIQUE: Multiplanar, multiecho pulse sequences of the brain and surrounding structures were obtained without and with intravenous contrast. CONTRAST:  7mL GADAVIST  GADOBUTROL  1 MMOL/ML IV SOLN COMPARISON:  None. FINDINGS: Brain: Moderate generalized cerebral atrophy.  Mild cerebellar atrophy. Multifocal T2 FLAIR hyperintense signal abnormality within the cerebral white matter and pons, nonspecific but compatible with mild chronic small vessel ischemic disease. Subcentimeter focus of susceptibility-weighted signal loss within the right cerebellar hemisphere, which may reflect a chronic microhemorrhage or cavernoma. Partially empty sella turcica. No cortical encephalomalacia is identified. There is no acute infarct. No evidence of an intracranial mass. No extra-axial fluid collection. No midline shift. The axial T1-weighted post-contrast sequence is mild-to-moderately motion degraded. Within this limitation, no pathologic intracranial enhancement identified. Vascular: Maintained flow voids within the proximal large  arterial vessels. Skull and upper cervical spine: No focal worrisome marrow lesion. Incompletely assessed cervical spondylosis. Sinuses/Orbits: No mass or acute finding within the imaged orbits. Prior bilateral ocular lens replacement. No significant paranasal sinus disease. IMPRESSION: 1. The axial T1-weighted post-contrast sequence is mild-to-moderately motion degraded. Within this limitation, no evidence of intracranial metastatic disease. 2. Mild chronic small vessel ischemic changes within the cerebral white matter and pons. 3. Subcentimeter focus of signal abnormality within the right cerebellar hemisphere, which may reflect a chronic microhemorrhage or cavernoma. 4. Moderate generalized cerebral atrophy. 5. Mild cerebellar atrophy. Electronically Signed   By: Bascom Lily D.O.   On: 04/21/2023 20:18    ASSESSMENT AND PLAN: This is a very pleasant 78 years old African-American [name] with Stage IIIB (T4, N2b, M0) non-small cell lung cancer, squamous cell carcinoma. She presented with large pleural-based peripheral right upper lobe lung mass measuring up to 8.2 cm in size with large right paratracheal node with low level hypermetabolic uptake. She is currently undergoing a course of concurrent chemoradiation with weekly carboplatin  for AUC of 2 and paclitaxel  45 Mg/M2.  Status post 1 cycle.    Stage IIIB (T4, N2b, M0) non-small cell lung cancer, squamous cell carcinoma. She presented with large pleural-based peripheral right upper lobe lung mass measuring up to 8.2 cm in size with large right paratracheal node with low level hypermetabolic uptake.  Diagnosed in March 2025. Currently undergoing concurrent chemoradiation with carboplatin  for an AUC of 2 and taxol  45 mg/m2. First dose expected on 05/08/23.  Status post 1 cycle. Currently in the second week of chemotherapy. Reports feeling cold but denies nausea, vomiting, diarrhea, chest pain, dyspnea, or new cough. Effectiveness to be evaluated  post-treatment with a scan. - Continue chemotherapy as scheduled. Follow up visit in 2 weeks. - Monitor for side effects and manage symptoms as needed. - Schedule a post-treatment scan to evaluate effectiveness.   The patient was advised to call immediately if she has any concerning symptoms in the interval. The patient voices understanding of current disease status and treatment options and is in agreement with the current care plan.  All questions were answered. The patient knows to call the clinic with any problems, questions or concerns. We can certainly see the patient much sooner if necessary.  The total time spent in the appointment was 20 minutes.  Disclaimer: This note  was dictated with voice recognition software. Similar sounding words can inadvertently be transcribed and may not be corrected upon review.

## 2023-05-16 ENCOUNTER — Inpatient Hospital Stay

## 2023-05-16 ENCOUNTER — Other Ambulatory Visit: Payer: Self-pay

## 2023-05-16 ENCOUNTER — Other Ambulatory Visit: Payer: Self-pay | Admitting: Radiology

## 2023-05-16 ENCOUNTER — Ambulatory Visit
Admission: RE | Admit: 2023-05-16 | Discharge: 2023-05-16 | Disposition: A | Discharge status: Home / Self Care | Source: Ambulatory Visit | Attending: Radiation Oncology | Admitting: Radiation Oncology

## 2023-05-16 ENCOUNTER — Telehealth: Payer: Self-pay | Admitting: Medical Oncology

## 2023-05-16 ENCOUNTER — Ambulatory Visit

## 2023-05-16 ENCOUNTER — Inpatient Hospital Stay: Admitting: Nurse Practitioner

## 2023-05-16 VITALS — BP 108/72 | HR 72 | Temp 98.0°F | Resp 16

## 2023-05-16 DIAGNOSIS — C3411 Malignant neoplasm of upper lobe, right bronchus or lung: Secondary | ICD-10-CM | POA: Diagnosis not present

## 2023-05-16 DIAGNOSIS — Z87891 Personal history of nicotine dependence: Secondary | ICD-10-CM | POA: Diagnosis not present

## 2023-05-16 DIAGNOSIS — R918 Other nonspecific abnormal finding of lung field: Secondary | ICD-10-CM

## 2023-05-16 DIAGNOSIS — C3491 Malignant neoplasm of unspecified part of right bronchus or lung: Secondary | ICD-10-CM | POA: Diagnosis not present

## 2023-05-16 DIAGNOSIS — Z01818 Encounter for other preprocedural examination: Secondary | ICD-10-CM

## 2023-05-16 DIAGNOSIS — R300 Dysuria: Secondary | ICD-10-CM | POA: Diagnosis not present

## 2023-05-16 DIAGNOSIS — Z510 Encounter for antineoplastic radiation therapy: Secondary | ICD-10-CM | POA: Diagnosis not present

## 2023-05-16 LAB — RAD ONC ARIA SESSION SUMMARY
Course Elapsed Days: 27
Plan Fractions Treated to Date: 8
Plan Prescribed Dose Per Fraction: 1.8 Gy
Plan Total Fractions Prescribed: 17
Plan Total Prescribed Dose: 30.6 Gy
Reference Point Dosage Given to Date: 14.4 Gy
Reference Point Session Dosage Given: 1.8 Gy
Session Number: 16

## 2023-05-16 MED ORDER — SODIUM CHLORIDE 0.9 % IV SOLN
162.4000 mg | Freq: Once | INTRAVENOUS | Status: AC
  Administered 2023-05-16: 160 mg via INTRAVENOUS
  Filled 2023-05-16: qty 16

## 2023-05-16 MED ORDER — DEXAMETHASONE SODIUM PHOSPHATE 10 MG/ML IJ SOLN
10.0000 mg | Freq: Once | INTRAMUSCULAR | Status: AC
  Administered 2023-05-16: 10 mg via INTRAVENOUS
  Filled 2023-05-16: qty 1

## 2023-05-16 MED ORDER — CETIRIZINE HCL 10 MG/ML IV SOLN
10.0000 mg | Freq: Once | INTRAVENOUS | Status: AC
  Administered 2023-05-16: 10 mg via INTRAVENOUS
  Filled 2023-05-16: qty 1

## 2023-05-16 MED ORDER — PALONOSETRON HCL INJECTION 0.25 MG/5ML
0.2500 mg | Freq: Once | INTRAVENOUS | Status: AC
  Administered 2023-05-16: 0.25 mg via INTRAVENOUS
  Filled 2023-05-16: qty 5

## 2023-05-16 MED ORDER — FAMOTIDINE IN NACL 20-0.9 MG/50ML-% IV SOLN
20.0000 mg | Freq: Once | INTRAVENOUS | Status: AC
  Administered 2023-05-16: 20 mg via INTRAVENOUS
  Filled 2023-05-16: qty 50

## 2023-05-16 MED ORDER — SODIUM CHLORIDE 0.9 % IV SOLN
45.0000 mg/m2 | Freq: Once | INTRAVENOUS | Status: AC
  Administered 2023-05-16: 84 mg via INTRAVENOUS
  Filled 2023-05-16: qty 14

## 2023-05-16 MED ORDER — SODIUM CHLORIDE 0.9 % IV SOLN
INTRAVENOUS | Status: DC
Start: 2023-05-16 — End: 2023-05-16

## 2023-05-16 NOTE — Patient Instructions (Signed)
 [facility] - A DEPT OF [facility]  Discharge Instructions: Thank you for choosing [facility] to provide your oncology and hematology care.   If you have a lab appointment with the Cancer Center, please go directly to the Cancer Center and check in at the registration area.   Wear comfortable clothing and clothing appropriate for easy access to any Portacath or PICC line.   We strive to give you quality time with your provider. You may need to reschedule your appointment if you arrive late (15 or more minutes).  Arriving late affects you and other patients whose appointments are after yours.  Also, if you miss three or more appointments without notifying the office, you may be dismissed from the clinic at the provider's discretion.      For prescription refill requests, have your pharmacy contact our office and allow 72 hours for refills to be completed.    Today you received the following chemotherapy and/or immunotherapy agents: Paclitaxel, Carboplatin      To help prevent nausea and vomiting after your treatment, we encourage you to take your nausea medication as directed.  BELOW ARE SYMPTOMS THAT SHOULD BE REPORTED IMMEDIATELY: *FEVER GREATER THAN 100.4 F (38 C) OR HIGHER *CHILLS OR SWEATING *NAUSEA AND VOMITING THAT IS NOT CONTROLLED WITH YOUR NAUSEA MEDICATION *UNUSUAL SHORTNESS OF BREATH *UNUSUAL BRUISING OR BLEEDING *URINARY PROBLEMS (pain or burning when urinating, or frequent urination) *BOWEL PROBLEMS (unusual diarrhea, constipation, pain near the anus) TENDERNESS IN MOUTH AND THROAT WITH OR WITHOUT PRESENCE OF ULCERS (sore throat, sores in mouth, or a toothache) UNUSUAL RASH, SWELLING OR PAIN  UNUSUAL VAGINAL DISCHARGE OR ITCHING   Items with * indicate a potential emergency and should be followed up as soon as possible or go to the Emergency Department if any problems should occur.  Please show the CHEMOTHERAPY ALERT CARD or  IMMUNOTHERAPY ALERT CARD at check-in to the Emergency Department and triage nurse.  Should you have questions after your visit or need to cancel or reschedule your appointment, please contact [facility] - A DEPT OF [facility]  Dept: [phone]  and follow the prompts.  Office hours are 8:00 a.m. to 4:30 p.m. Monday - Friday. Please note that voicemails left after 4:00 p.m. may not be returned until the following business day.  We are closed weekends and major holidays. You have access to a nurse at all times for urgent questions. Please call the main number to the clinic Dept: [phone] and follow the prompts.   For any non-urgent questions, you may also contact your provider using MyChart. We now offer e-Visits for anyone 62 and older to request care online for non-urgent symptoms. For details visit [email or web address].   Also download the MyChart app! Go to the app store, search "MyChart", open the app, select , and log in with your MyChart username and password.  Paclitaxel Injection What is this medication? PACLITAXEL (PAK li TAX el) treats some types of cancer. It works by slowing down the growth of cancer cells. This medicine may be used for other purposes; ask your health care provider or pharmacist if you have questions. COMMON BRAND NAME(S): Onxol, Taxol What should I tell my care team before I take this medication? They need to know if you have any of these conditions: Heart disease Liver disease Low white blood cell levels An unusual or allergic reaction to paclitaxel, other medications,  foods, dyes, or preservatives If you or your partner are pregnant or trying to get pregnant Breast-feeding How should I use this medication? This medication is injected into a vein. It is given by your care team in a hospital or clinic setting. Talk to your care team about the use of this medication in children. While it may be given to children  for selected conditions, precautions do apply. Overdosage: If you think you have taken too much of this medicine contact a poison control center or emergency room at once. NOTE: This medicine is only for you. Do not share this medicine with others. What if I miss a dose? Keep appointments for follow-up doses. It is important not to miss your dose. Call your care team if you are unable to keep an appointment. What may interact with this medication? Do not take this medication with any of the following: Live virus vaccines Other medications may affect the way this medication works. Talk with your care team about all of the medications you take. They may suggest changes to your treatment plan to lower the risk of side effects and to make sure your medications work as intended. This list may not describe all possible interactions. Give your health care provider a list of all the medicines, herbs, non-prescription drugs, or dietary supplements you use. Also tell them if you smoke, drink alcohol, or use illegal drugs. Some items may interact with your medicine. What should I watch for while using this medication? Your condition will be monitored carefully while you are receiving this medication. You may need blood work while taking this medication. This medication may make you feel generally unwell. This is not uncommon as chemotherapy can affect healthy cells as well as cancer cells. Report any side effects. Continue your course of treatment even though you feel ill unless your care team tells you to stop. This medication can cause serious allergic reactions. To reduce the risk, your care team may give you other medications to take before receiving this one. Be sure to follow the directions from your care team. This medication may increase your risk of getting an infection. Call your care team for advice if you get a fever, chills, sore throat, or other symptoms of a cold or flu. Do not treat yourself. Try  to avoid being around people who are sick. This medication may increase your risk to bruise or bleed. Call your care team if you notice any unusual bleeding. Be careful brushing or flossing your teeth or using a toothpick because you may get an infection or bleed more easily. If you have any dental work done, tell your dentist you are receiving this medication. Talk to your care team if you may be pregnant. Serious birth defects can occur if you take this medication during pregnancy. Talk to your care team before breastfeeding. Changes to your treatment plan may be needed. What side effects may I notice from receiving this medication? Side effects that you should report to your care team as soon as possible: Allergic reactions--skin rash, itching, hives, swelling of the face, lips, tongue, or throat Heart rhythm changes--fast or irregular heartbeat, dizziness, feeling faint or lightheaded, chest pain, trouble breathing Increase in blood pressure Infection--fever, chills, cough, sore throat, wounds that don't heal, pain or trouble when passing urine, general feeling of discomfort or being unwell Low blood pressure--dizziness, feeling faint or lightheaded, blurry vision Low red blood cell level--unusual weakness or fatigue, dizziness, headache, trouble breathing Painful swelling, warmth, or redness  of the skin, blisters or sores at the infusion site Pain, tingling, or numbness in the hands or feet Slow heartbeat--dizziness, feeling faint or lightheaded, confusion, trouble breathing, unusual weakness or fatigue Unusual bruising or bleeding Side effects that usually do not require medical attention (report to your care team if they continue or are bothersome): Diarrhea Hair loss Joint pain Loss of appetite Muscle pain Nausea Vomiting This list may not describe all possible side effects. Call your doctor for medical advice about side effects. You may report side effects to FDA at  1-800-FDA-1088. Where should I keep my medication? This medication is given in a hospital or clinic. It will not be stored at home. NOTE: This sheet is a summary. It may not cover all possible information. If you have questions about this medicine, talk to your doctor, pharmacist, or health care provider.  2024 Elsevier/Gold Standard (2021-05-25 00:00:00)  Carboplatin Injection What is this medication? CARBOPLATIN (KAR boe pla tin) treats some types of cancer. It works by slowing down the growth of cancer cells. This medicine may be used for other purposes; ask your health care provider or pharmacist if you have questions. COMMON BRAND NAME(S): Paraplatin What should I tell my care team before I take this medication? They need to know if you have any of these conditions: Blood disorders Hearing problems Kidney disease Recent or ongoing radiation therapy An unusual or allergic reaction to carboplatin, cisplatin, other medications, foods, dyes, or preservatives Pregnant or trying to get pregnant Breast-feeding How should I use this medication? This medication is injected into a vein. It is given by your care team in a hospital or clinic setting. Talk to your care team about the use of this medication in children. Special care may be needed. Overdosage: If you think you have taken too much of this medicine contact a poison control center or emergency room at once. NOTE: This medicine is only for you. Do not share this medicine with others. What if I miss a dose? Keep appointments for follow-up doses. It is important not to miss your dose. Call your care team if you are unable to keep an appointment. What may interact with this medication? Medications for seizures Some antibiotics, such as amikacin, gentamicin, neomycin, streptomycin, tobramycin Vaccines This list may not describe all possible interactions. Give your health care provider a list of all the medicines, herbs,  non-prescription drugs, or dietary supplements you use. Also tell them if you smoke, drink alcohol, or use illegal drugs. Some items may interact with your medicine. What should I watch for while using this medication? Your condition will be monitored carefully while you are receiving this medication. You may need blood work while taking this medication. This medication may make you feel generally unwell. This is not uncommon, as chemotherapy can affect healthy cells as well as cancer cells. Report any side effects. Continue your course of treatment even though you feel ill unless your care team tells you to stop. In some cases, you may be given additional medications to help with side effects. Follow all directions for their use. This medication may increase your risk of getting an infection. Call your care team for advice if you get a fever, chills, sore throat, or other symptoms of a cold or flu. Do not treat yourself. Try to avoid being around people who are sick. Avoid taking medications that contain aspirin, acetaminophen, ibuprofen, naproxen, or ketoprofen unless instructed by your care team. These medications may hide a fever. Be careful  brushing or flossing your teeth or using a toothpick because you may get an infection or bleed more easily. If you have any dental work done, tell your dentist you are receiving this medication. Talk to your care team if you wish to become pregnant or think you might be pregnant. This medication can cause serious birth defects. Talk to your care team about effective forms of contraception. Do not breast-feed while taking this medication. What side effects may I notice from receiving this medication? Side effects that you should report to your care team as soon as possible: Allergic reactions--skin rash, itching, hives, swelling of the face, lips, tongue, or throat Infection--fever, chills, cough, sore throat, wounds that don't heal, pain or trouble when passing  urine, general feeling of discomfort or being unwell Low red blood cell level--unusual weakness or fatigue, dizziness, headache, trouble breathing Pain, tingling, or numbness in the hands or feet, muscle weakness, change in vision, confusion or trouble speaking, loss of balance or coordination, trouble walking, seizures Unusual bruising or bleeding Side effects that usually do not require medical attention (report to your care team if they continue or are bothersome): Hair loss Nausea Unusual weakness or fatigue Vomiting This list may not describe all possible side effects. Call your doctor for medical advice about side effects. You may report side effects to FDA at 1-800-FDA-1088. Where should I keep my medication? This medication is given in a hospital or clinic. It will not be stored at home. NOTE: This sheet is a summary. It may not cover all possible information. If you have questions about this medicine, talk to your doctor, pharmacist, or health care provider.  2024 Elsevier/Gold Standard (2021-04-27 00:00:00)

## 2023-05-16 NOTE — Telephone Encounter (Signed)
 I could not run patients for benefits for some reason, Normand Beckwith was able to run the benefits for patients. I will keep a look out for the response

## 2023-05-16 NOTE — H&P (Signed)
 Chief Complaint: Stage III B squamous cell lung cancer; chemotherapy - referred for image guided port a catheter placement   Referring Provider(s): Heilingoetter, Donne Gage Lyndell Sanfilippo  Supervising Physician: Marland Silvas  Patient Status: [facility] - Out-pt  History of Present Illness: [name] is a 78 y.o. female with stage III B squamous cell lung cancer.  She is followed by Dr. Marguerita Shih of Oncology and was initially diagnosed in March 2025 after she presented with a large, pleural based, peripheral right upper lobe lung mass.  She is currently undergoing chemotherapy and radiation treatment and was referred to interventional radiology for image guided port a catheter placement.     Patient is Full Code  Past Medical History:  Diagnosis Date   AAA (abdominal aortic aneurysm) (HCC)    a. s/p stent graft repair 01/2016.   Acute ischemic colitis (HCC) 07/29/2010   Diagnosed June, 2012 characterized by acute lower GI bleeding, spontaneously resolved.    Acute respiratory infection 06/09/2014   Acute sinus infection 05/14/2015   Allergic rhinitis, cause unspecified    Angioedema of lips 06/03/2014   Anxiety state, unspecified    Arthritis of left hip 04/16/2015   Bilateral hearing loss 07/19/2017   Chronic LBP    Coronary artery disease    a. inferior MI 1998 s/p PCI of RCA. b. stenting of Cx 04/2010. c. DES to prox LAD 05/2013. d. DES x 3 in 10/2015. // Myoview  07/2018:  EF 59, normal perfusion, low risk    Coronary artery disease involving native heart without angina pectoris 07/31/2006   Qualifier: Diagnosis of  By: Edmonia Gottron MD, Beauford Bounds  ANGIOGRAPHIC DATA:  1. Ventriculography done in the RAO projection reveals a small wall  motion abnormality in the mid inferior wall. Ejection fraction  would be estimated at around 50%.  2. The right coronary artery has some progressive disease of about 60-  80% in the proximal mid segment. The distal vessel appears to be  50-70% narrowing althou    Degeneration of lumbar or lumbosacral intervertebral disc    Depressive disorder, not elsewhere classified    Diabetes (HCC) 11/03/2006   Qualifier: Diagnosis of  By: Autry Legions MD, Alveda Aures    Diabetes mellitus    TYPE II   Frequent urination 05/08/2014   Gout 08/22/2013   History of endovascular stent graft for abdominal aortic aneurysm (AAA) 01/22/2016   Hyperlipidemia    Hypertension    Hypothyroidism    Iron  deficiency anemia 06/13/2017   Lower GI bleed 06/2010   Diverticular bleed   Lumbar radiculopathy 05/15/2015   MENOPAUSAL DISORDER 10/29/2007   Qualifier: Diagnosis of  By: Autry Legions MD, Alveda Aures    Myalgia 09/25/2013   Noncompliance with medications 02/26/2014   Obesity, unspecified    OTITIS MEDIA, SEROUS, CHRONIC 04/23/2007   Qualifier: Diagnosis of  By: Edmonia Gottron MD, Beauford Bounds    Thoracic disc disease 11/19/2018   URTICARIA 09/23/2009   Qualifier: Diagnosis of  By: Myrtice Athens MD, Tarri Farm A    Venous insufficiency 07/19/2017    Past Surgical History:  Procedure Laterality Date   CARDIAC CATHETERIZATION     PCI OF BOTH THE CIRCUMFLEX AND LEFT ANTERIOR DESCENDING ARTERY   CARDIAC CATHETERIZATION N/A 10/29/2015   Procedure: Coronary Stent Intervention;  Surgeon: Sammy Crisp, MD;  Location: [facility] INVASIVE CV LAB;  Service: Cardiovascular;  Laterality: N/A;   CARDIAC CATHETERIZATION N/A 10/29/2015   Procedure: Coronary/Graft Angiography;  Surgeon: Sammy Crisp, MD;  Location: [facility] INVASIVE CV LAB;  Service: Cardiovascular;  Laterality: N/A;   CARDIAC CATHETERIZATION N/A 10/29/2015   Procedure: Intravascular Pressure Wire/FFR Study;  Surgeon: Sammy Crisp, MD;  Location: [facility] INVASIVE CV LAB;  Service: Cardiovascular;  Laterality: N/A;   CESAREAN SECTION     ENDOVASCULAR STENT INSERTION N/A 01/22/2016   Procedure: ABDOMINAL AORTIC ENDOVASCULAR STENT GRAFT INSERTION;  Surgeon: Mayo Speck, MD;  Location: [facility] OR;  Service: Vascular;  Laterality: N/A;   HEART STENT  04-2010  and  Jun 07, 2013   X 3   LEFT  HEART CATH AND CORONARY ANGIOGRAPHY N/A 04/03/2019   Procedure: LEFT HEART CATH AND CORONARY ANGIOGRAPHY;  Surgeon: Sammy Crisp, MD;  Location: [facility] INVASIVE CV LAB;  Service: Cardiovascular;  Laterality: N/A;   LEFT HEART CATHETERIZATION WITH CORONARY ANGIOGRAM N/A 06/07/2013   Procedure: LEFT HEART CATHETERIZATION WITH CORONARY ANGIOGRAM;  Surgeon: Odie Benne, MD;  Location: [facility] CATH LAB;  Service: Cardiovascular;  Laterality: N/A;   LEFT HEART CATHETERIZATION WITH CORONARY ANGIOGRAM N/A 02/25/2014   Procedure: LEFT HEART CATHETERIZATION WITH CORONARY ANGIOGRAM;  Surgeon: Millicent Ally, MD;  Location: [facility] CATH LAB;  Service: Cardiovascular;  Laterality: N/A;   LUMBAR FUSION  01/2007   DR. Faylene Hoots...3-LEVEL WITH FIXATION   OOPHORECTOMY     BSO? pt.unsure   PARATHYROIDECTOMY     SPINE SURGERY     THYROIDECTOMY     TOTAL ABDOMINAL HYSTERECTOMY      Allergies: Crestor  [rosuvastatin  calcium ], Prilosec [omeprazole ], Miconazole nitrate, Augmentin  [amoxicillin -pot clavulanate], and Doxycycline   Medications: Prior to Admission medications   Medication Sig Start Date End Date Taking? Authorizing Provider  aspirin  81 MG EC tablet Take 81 mg by mouth daily.     [provider]  atenolol  (TENORMIN ) 25 MG tablet TAKE 1 TABLET BY MOUTH EVERY DAY. 01/20/23   Roslyn Coombe, MD  atorvastatin  (LIPITOR ) 40 MG tablet Take 1 tablet (40 mg total) by mouth daily. Patient not taking: Reported on 03/21/2023 10/07/22 01/05/23  Arnoldo Lapping, MD  fluconazole  (DIFLUCAN ) 150 MG tablet Take one tab by mouth every 3 days as needed Patient not taking: Reported on 05/09/2023 03/30/23   Roslyn Coombe, MD  Foot Care Products ([employer] GEL HEEL CUSHION WOMENS) PADS Use as directed at bedtime 03/30/23   Roslyn Coombe, MD  gabapentin  (NEURONTIN ) 300 MG capsule Take 1-2 tab by mouth at bedtime for pain and sleep Patient taking differently: Take 300 mg by mouth as needed. Take 1-2 tab by mouth at bedtime for pain and  sleep 04/22/21   Roslyn Coombe, MD  HYDROcodone -acetaminophen  (NORCO) 10-325 MG tablet Take 1 tablet by mouth every 8 (eight) hours as needed. 04/25/23   Retta Caster, MD  isosorbide  mononitrate (IMDUR ) 30 MG 24 hr tablet Take 0.5 tablets (15 mg total) by mouth daily. 03/28/23 05/26/23  Lesa Rape, MD  levofloxacin  (LEVAQUIN ) 500 MG tablet Take 1 tablet (500 mg total) by mouth daily. 04/10/23   Roslyn Coombe, MD  levothyroxine  (SYNTHROID ) 100 MCG tablet Take 1 tablet (100 mcg total) by mouth daily before breakfast. 03/28/23 04/27/23  Lesa Rape, MD  lidocaine -prilocaine  (EMLA ) cream Apply 1 Application topically as needed. 04/27/23   Heilingoetter, Cassandra L, PA-C  phenazopyridine  (PYRIDIUM ) 200 MG tablet Take 1 tablet (200 mg total) by mouth 3 (three) times daily as needed for pain (with urination). 05/01/23   Retta Caster, MD  prochlorperazine  (COMPAZINE ) 10 MG tablet Take 1 tablet (10 mg total) by mouth every 6 (six) hours as needed. 04/27/23  Heilingoetter, Cassandra L, PA-C  temazepam  (RESTORIL ) 15 MG capsule Take 1 capsule (15 mg total) by mouth at bedtime as needed for sleep. 04/10/23   Roslyn Coombe, MD  Triamcinolone  Acetonide (ZILRETTA ) 32 MG SRER intra-articular injection 5 mL, intra-articular, bilat knees 01/27/23   Syliva Even, MD     Family History  Problem Relation Age of Onset   Heart attack Mother 19       s/p D&C-CARDIAC ARREST 1966   Heart disease Mother    Heart attack Father 69       27 WITH MI   Heart disease Father    Diabetes Brother    Diabetes Maternal Aunt    Arthritis Maternal Aunt    Breast cancer Maternal Aunt        Post menopausal   Colon cancer Neg Hx     Social History   Socioeconomic History   Marital status: Divorced    Spouse name: Not on file   Number of children: 3   Years of education: 13   Highest education level: Not on file  Occupational History   Occupation: RETIRED    Employer: [employer]    Comment: ADMIN SUPPORT    Employer:  RETIRED  Tobacco Use   Smoking status: Former    Current packs/day: 0.00    Types: Cigarettes    Start date: 05/30/1956    Quit date: 05/31/1986    Years since quitting: 36.9   Smokeless tobacco: Never  Vaping Use   Vaping status: Never Used  Substance and Sexual Activity   Alcohol use: No   Drug use: No   Sexual activity: Not Currently    Comment: 1st intercourse 78 yo-Fewer than 5 partners  Other Topics Concern   Not on file  Social History Narrative   DIVORCED   3 CHILDREN   PATIENT SIGNED A DESIGNATED PARTY RELEASE TO ALLOW HER DAUGHTER, TRAMAINE Sollenberger, TO HAVE ACCESS TO HER MEDICAL RECORDS/INFORMATION. Deette Fass, May 04, 2009 @ 3:27 PM   Right handed   One story home      Lives alone.2025   .   Social Drivers of Health   Financial Resource Strain: Medium Risk (04/11/2023)   Overall Financial Resource Strain (CARDIA)    Difficulty of Paying Living Expenses: Somewhat hard  Food Insecurity: No Food Insecurity (04/13/2023)   Hunger Vital Sign    Worried About Running Out of Food in the Last Year: Never true    Ran Out of Food in the Last Year: Never true  Transportation Needs: No Transportation Needs (04/13/2023)   PRAPARE - Administrator, Civil Service (Medical): No    Lack of Transportation (Non-Medical): No  Physical Activity: Insufficiently Active (04/11/2023)   Exercise Vital Sign    Days of Exercise per Week: 2 days    Minutes of Exercise per Session: 10 min  Stress: No Stress Concern Present (04/11/2023)   [employer] of Occupational Health - Occupational Stress Questionnaire    Feeling of Stress : Not at all  Social Connections: Moderately Integrated (04/11/2023)   Social Connection and Isolation Panel [NHANES]    Frequency of Communication with Friends and Family: More than three times a week    Frequency of Social Gatherings with Friends and Family: Twice a week    Attends Religious Services: More than 4 times per year    Active Member  of [employer] or Organizations: Yes    Attends Banker  Meetings: Never    Marital Status: Divorced  Recent Concern: Social Connections - Socially Isolated (03/22/2023)   Social Connection and Isolation Panel [NHANES]    Frequency of Communication with Friends and Family: Twice a week    Frequency of Social Gatherings with Friends and Family: Once a week    Attends Religious Services: Never    Database administrator or Organizations: No    Attends Engineer, structural: Never    Marital Status: Divorced       Review of Systems; denies fever,HA,CP,dyspnea, cough, abd pain,N/V or bleeding; she does have occ back pain  Vital Signs: Vitals:   05/17/23 1128  BP: 101/72  Pulse: 66  Resp: 16  Temp: 97.7 F (36.5 C)  SpO2: 95%     Advance Care Plan: no documents on file   Physical Exam: awake/alert; chest- CTA bilat; heart- RRR; abd-soft,+BS,NT; trace- 1+ pretibial edema bilat  Imaging: NM PET Image Initial (PI) Skull Base To Thigh (F-18 FDG) Result Date: 04/26/2023 CLINICAL DATA:  Initial treatment strategy for non-small-cell lung cancer. EXAM: NUCLEAR MEDICINE PET SKULL BASE TO THIGH TECHNIQUE: 8.0 mCi F-18 FDG was injected intravenously. Full-ring PET imaging was performed from the skull base to thigh after the radiotracer. CT data was obtained and used for attenuation correction and anatomic localization. Fasting blood glucose: 88 mg/dl COMPARISON:  CT chest 40/98/1191 FINDINGS: Mediastinal blood pool activity: SUV max 2.8 Liver activity: SUV max 2.9 NECK: There is some physiologic muscle uptake along the left neck and along the left vocal cord. No specific abnormal uptake seen above mediastinal blood pool in the soft tissues of neck otherwise including along lymph node change of the submandibular, posterior triangle or internal jugular region. Near symmetric uptake along the visualized portions of the intracranial compartment. Incidental CT findings: Visualized portions  of the paranasal sinuses and mastoid air cells are clear. The parotid glands, submandibular glands unremarkable. Atrophic thyroid  gland. CHEST: Lobular heterogeneous enhancement identified in the area of cavitary mass in the peripheral right upper lobe abutting the pleura. Maximum SUV value of this area of 39.6. Masslike area previously measured 8.2 x 2.6 cm and today 8.3 by 3.0 cm on series 4, image 58. There is some irregularity of the lateral aspect of the right third rib abutting this mass and may be involved. Please correlate with biopsy results. No additional areas of abnormal radiotracer uptake along the lung parenchyma. There is an enlarged right paratracheal, precarinal node on series 4, image 65 with short axis measurement 10 mm. This has minimal uptake of maximum SUV of 3.6, just above blood pool. No additional areas uptake along the axillary region, hilum or mediastinum above mediastinal blood pool. There is a focus of uptake identified with maximum SUV value of 6.5 corresponding to a focus along the posterior aspect of the right third rib separate from the lateral area. Additional area of bone involvement of tumor is possible. Incidental CT findings: Significant streak artifact related to the spinal fixation hardware. No pleural effusion or pneumothorax. Mild bandlike changes along the right lung base, likely scar or atelectasis greater than left. Underlying emphysematous lung changes are identified. Heart is nonenlarged. Coronary artery calcifications are seen. Tortuous course to the descending thoracic aorta. No specific abnormal nodal enlargement identified in the axillary region, hilum on this noncontrast CT. ABDOMEN/PELVIS: There is physiologic distribution of radiotracer along the parenchymal organs, bowel and renal collecting systems.There is some uptake along areas of nodular thickening along the subcutaneous fat of  the pelvis on the left side with maximum SUV of 7.2 and a nodular area on image  154 of 12 mm. Similar larger but more more facet on the right side. Incidental CT findings: Significant streak artifact from the spinal fixation hardware. Aortic endograft in place. Diffuse colonic stool with colonic diverticula. No bowel obstruction, free air or free fluid. Small Paddock cysts are again seen. SKELETON: Extensive hardware identified along the spine including thoracic and lumbar regions. Extensive degenerative changes identified along the spine and pelvis. Again please see chest section for areas of abnormal uptake and irregularity along the right third rib. IMPRESSION: Centrally cavitary focal soft tissue mass in the lateral right upper lobe extending out to the pleura is hypermetabolic consistent with known neoplasm. There is a associated area of irregularity and uptake along the lateral aspect of the right third rib adjacent to the tumor, likely involvement. There is a separate area of uptake separately in the posterior aspect of the right third rib as well consistent with additional area of potential disease. Borderline enlarged solitary right paratracheal node with minimal uptake above blood pool. nonspecific with the low level of uptake. Attention on follow-up. Focus of uptake along a nodule in the subcutaneous fat posterior along the pelvis on both sides. Nonspecific but please correlate for injection sites or other process. Attention on follow-up Electronically Signed   By: Adrianna Horde M.D.   On: 04/26/2023 13:09   DG Chest Port 1 View Result Date: 04/22/2023 CLINICAL DATA:  Chest pain. EXAM: PORTABLE CHEST 1 VIEW COMPARISON:  Chest radiograph dated 03/27/2023. FINDINGS: Similar appearance of an area of pleural thickening in the right upper lobe. No new consolidation. There is no pleural effusion pneumothorax. Stable cardiac silhouette. Coronary vascular stent. No acute osseous pathology. Spinal fusion hardware. IMPRESSION: No active disease. Electronically Signed   By: Angus Bark  M.D.   On: 04/22/2023 19:16   MR BRAIN W WO CONTRAST Result Date: 04/21/2023 CLINICAL DATA:  Provided history: Malignant neoplasm of unspecified part of unspecified bronchus or lung. Non-small cell lung cancer, staging. EXAM: MRI HEAD WITHOUT AND WITH CONTRAST TECHNIQUE: Multiplanar, multiecho pulse sequences of the brain and surrounding structures were obtained without and with intravenous contrast. CONTRAST:  7mL GADAVIST  GADOBUTROL  1 MMOL/ML IV SOLN COMPARISON:  None. FINDINGS: Brain: Moderate generalized cerebral atrophy.  Mild cerebellar atrophy. Multifocal T2 FLAIR hyperintense signal abnormality within the cerebral white matter and pons, nonspecific but compatible with mild chronic small vessel ischemic disease. Subcentimeter focus of susceptibility-weighted signal loss within the right cerebellar hemisphere, which may reflect a chronic microhemorrhage or cavernoma. Partially empty sella turcica. No cortical encephalomalacia is identified. There is no acute infarct. No evidence of an intracranial mass. No extra-axial fluid collection. No midline shift. The axial T1-weighted post-contrast sequence is mild-to-moderately motion degraded. Within this limitation, no pathologic intracranial enhancement identified. Vascular: Maintained flow voids within the proximal large arterial vessels. Skull and upper cervical spine: No focal worrisome marrow lesion. Incompletely assessed cervical spondylosis. Sinuses/Orbits: No mass or acute finding within the imaged orbits. Prior bilateral ocular lens replacement. No significant paranasal sinus disease. IMPRESSION: 1. The axial T1-weighted post-contrast sequence is mild-to-moderately motion degraded. Within this limitation, no evidence of intracranial metastatic disease. 2. Mild chronic small vessel ischemic changes within the cerebral white matter and pons. 3. Subcentimeter focus of signal abnormality within the right cerebellar hemisphere, which may reflect a chronic  microhemorrhage or cavernoma. 4. Moderate generalized cerebral atrophy. 5. Mild cerebellar atrophy. Electronically Signed  By: Bascom Lily D.O.   On: 04/21/2023 20:18    Labs:  CBC: Recent Labs    04/22/23 1830 04/27/23 1231 05/08/23 0728 05/15/23 0831  WBC 8.6 6.7 5.8 4.9  HGB 11.7* 11.9* 11.5* 11.4*  HCT 36.1 37.1 35.3* 35.8*  PLT 310 382 400 302    COAGS: Recent Labs    03/26/23 0804 03/27/23 0741  INR 1.1 1.2    BMP: Recent Labs    04/22/23 1830 04/27/23 1231 05/08/23 0728 05/15/23 0831  NA 139 138 140 141  K 2.9* 3.9 3.6 3.9  CL 103 101 104 106  CO2 26 33* 29 31  GLUCOSE 87 89 88 85  BUN <5* <5* 5* 5*  CALCIUM  7.6* 8.1* 7.9* 8.1*  CREATININE 0.68 0.64 0.72 0.65  GFRNONAA >60 >60 >60 >60    LIVER FUNCTION TESTS: Recent Labs    04/22/23 1830 04/27/23 1231 05/08/23 0728 05/15/23 0831  BILITOT 1.0 0.5 0.4 0.3  AST 14* 17 10* 12*  ALT 8 7 6 6   ALKPHOS 72 88 84 82  PROT 6.9 7.6 7.2 7.1  ALBUMIN 2.2* 3.0* 2.9* 3.0*    TUMOR MARKERS: No results for input(s): "AFPTM", "CEA", "CA199", "CHROMGRNA" in the last 8760 hours.  Assessment and Plan:  Pt is with stage III B squamous cell right lung cancer and currently undergoing chemoradiation; scheduled for image guided port a catheter placement 05/17/2023.    Risks and benefits of image guided port-a-catheter placement was discussed with the patient including, but not limited to bleeding, infection, pneumothorax, or fibrin sheath development and need for additional procedures.  All of the patient's questions were answered, patient is agreeable to proceed. Consent signed and in chart.   Thank you for allowing our service to participate in March AVYANNA UNDERDOWN 's care.  Electronically Signed: Pasty Bongo, PA-C Ernestina Headland Barnett Elzey,PA-C  05/16/2023, 2:29 PM    I spent a total of  15 minutes   in face to face in clinical consultation, greater than 50% of which was counseling/coordinating care for image guided  port a catheter placement.

## 2023-05-16 NOTE — Telephone Encounter (Signed)
 Pt fell and may be late for infusion.

## 2023-05-17 ENCOUNTER — Ambulatory Visit
Admission: RE | Admit: 2023-05-17 | Discharge: 2023-05-17 | Disposition: A | Discharge status: Home / Self Care | Source: Ambulatory Visit | Attending: Radiation Oncology | Admitting: Radiation Oncology

## 2023-05-17 ENCOUNTER — Encounter (HOSPITAL_COMMUNITY): Payer: Self-pay

## 2023-05-17 ENCOUNTER — Ambulatory Visit (HOSPITAL_COMMUNITY)
Admission: RE | Admit: 2023-05-17 | Discharge: 2023-05-17 | Disposition: A | Discharge status: Home / Self Care | Source: Ambulatory Visit | Attending: Physician Assistant | Admitting: Physician Assistant

## 2023-05-17 ENCOUNTER — Other Ambulatory Visit: Payer: Self-pay

## 2023-05-17 DIAGNOSIS — C3490 Malignant neoplasm of unspecified part of unspecified bronchus or lung: Secondary | ICD-10-CM | POA: Insufficient documentation

## 2023-05-17 DIAGNOSIS — R918 Other nonspecific abnormal finding of lung field: Secondary | ICD-10-CM

## 2023-05-17 DIAGNOSIS — Z01818 Encounter for other preprocedural examination: Secondary | ICD-10-CM

## 2023-05-17 DIAGNOSIS — C3411 Malignant neoplasm of upper lobe, right bronchus or lung: Secondary | ICD-10-CM | POA: Diagnosis not present

## 2023-05-17 DIAGNOSIS — C3491 Malignant neoplasm of unspecified part of right bronchus or lung: Secondary | ICD-10-CM | POA: Diagnosis not present

## 2023-05-17 HISTORY — PX: IR IMAGING GUIDED PORT INSERTION: IMG5740

## 2023-05-17 LAB — RAD ONC ARIA SESSION SUMMARY
Course Elapsed Days: 28
Plan Fractions Treated to Date: 9
Plan Prescribed Dose Per Fraction: 1.8 Gy
Plan Total Fractions Prescribed: 17
Plan Total Prescribed Dose: 30.6 Gy
Reference Point Dosage Given to Date: 16.2 Gy
Reference Point Session Dosage Given: 1.8 Gy
Session Number: 17

## 2023-05-17 LAB — GLUCOSE, CAPILLARY: Glucose-Capillary: 101 mg/dL — ABNORMAL HIGH (ref 70–99)

## 2023-05-17 MED ORDER — SODIUM CHLORIDE 0.9 % IV SOLN: INTRAVENOUS | Status: DC

## 2023-05-17 MED ORDER — MIDAZOLAM HCL 2 MG/2ML IJ SOLN
INTRAMUSCULAR | Status: AC | PRN
  Administered 2023-05-17: .5 mg via INTRAVENOUS

## 2023-05-17 MED ORDER — MIDAZOLAM HCL 2 MG/2ML IJ SOLN
INTRAMUSCULAR | Status: AC | PRN
  Administered 2023-05-17: 1 mg via INTRAVENOUS

## 2023-05-17 MED ORDER — DEXTROSE 50 % IV SOLN
1.0000 | Freq: Once | INTRAVENOUS | Status: AC
  Administered 2023-05-17: 50 mL via INTRAVENOUS

## 2023-05-17 MED ORDER — FENTANYL CITRATE (PF) 100 MCG/2ML IJ SOLN
INTRAMUSCULAR | Status: AC | PRN
  Administered 2023-05-17: 50 ug via INTRAVENOUS

## 2023-05-17 MED ORDER — MIDAZOLAM HCL 2 MG/2ML IJ SOLN
INTRAMUSCULAR | Status: AC
Start: 2023-05-17 — End: ?
  Filled 2023-05-17: qty 4

## 2023-05-17 MED ORDER — LIDOCAINE-EPINEPHRINE 1 %-1:100000 IJ SOLN
INTRAMUSCULAR | Status: AC
  Filled 2023-05-17: qty 1

## 2023-05-17 MED ORDER — HEPARIN SOD (PORK) LOCK FLUSH 100 UNIT/ML IV SOLN
INTRAVENOUS | Status: AC
  Filled 2023-05-17: qty 5

## 2023-05-17 MED ORDER — LIDOCAINE-EPINEPHRINE 1 %-1:100000 IJ SOLN: 20.0000 mL | Freq: Once | INTRAMUSCULAR | Status: DC

## 2023-05-17 MED ORDER — FENTANYL CITRATE (PF) 100 MCG/2ML IJ SOLN
INTRAMUSCULAR | Status: AC
Start: 2023-05-17 — End: ?
  Filled 2023-05-17: qty 2

## 2023-05-17 MED ORDER — DEXTROSE 50 % IV SOLN
INTRAVENOUS | Status: AC
  Filled 2023-05-17: qty 50

## 2023-05-17 MED ORDER — HEPARIN SOD (PORK) LOCK FLUSH 100 UNIT/ML IV SOLN: 500.0000 [IU] | Freq: Once | INTRAVENOUS | Status: DC

## 2023-05-17 NOTE — Discharge Instructions (Signed)
 Please call Interventional Radiology clinic [phone] with any questions or concerns.  You may remove your dressing and shower tomorrow.  After the procedure, it is common to have: Discomfort at the port insertion site. Bruising on the skin over the port. This should improve over 3-4 days  Follow these instructions at home:  Medication: Do not use Aspirin or ibuprofen products, such as Advil or Motrin, as it may increase bleeding.  You may resume your usual medications as ordered by your doctor. If your doctor prescribed antibiotics, take them as directed. Do not stop taking them just because you feel better. You need to take the full course of antibiotics.  Eating and drinking: Drink plenty of liquids to keep your urine pale yellow You can resume your regular diet as directed by your doctor   Care of the procedure site Follow instructions from your health care provider about how to take care of your port insertion site. Make sure you: After your port is placed, you will get a manufacturer's information card. The card has information about your port. Keep this card with you at all times Make sure to remember what type of port you have Take care of the port as told by your health care provider DO NOT use EMLA cream for 2 weeks after port placement -the cream will remove surgical glue on your incision Wash your hands with soap and water before and after you change your bandage (dressing). If soap and water are not available, use hand sanitizer Change your dressing as told by your health care provider Leave skin glue, or adhesive strips in place. These skin closures may need to stay in place for 2 weeks or longer Check your port insertion site every day for signs of infection. Check for: Redness, swelling, or pain Fluid or blood Warmth Pus or a bad smell  Activity Return to your normal activities as told by your health care provider. Ask your health care provider what activities  are safe for you Do not lift anything that is heavier than 10 lb (4.5 kg), or the limit that you are told, until your health care provider says that it is safe Do not take baths, swim, or use a hot tub until your health care provider approves. Take showers only. Keep all follow-up visits as told by your doctor  Contact a health care provider if: You cannot flush your port with saline as directed, or you cannot draw blood from the port You have a fever or chills You have redness, swelling, or pain around your port insertion site You have fluid or blood coming from your port insertion site Your port insertion site feels warm to the touch You have pus or a bad smell coming from the port insertion site  Get help right away if: You have chest pain or shortness of breath You have bleeding from your port that you cannot control   Moderate Conscious Sedation-Care After  This sheet gives you information about how to care for yourself after your procedure. Your health care provider may also give you more specific instructions. If you have problems or questions, contact your health care provider.  After the procedure, it is common to have: Sleepiness for several hours. Impaired judgment for several hours. Difficulty with balance. Vomiting if you eat too soon.  Follow these instructions at home:  Rest. Do not participate in activities where you could fall or become injured. Do not drive or use machinery. Do not drink alcohol. Do not  take sleeping pills or medicines that cause drowsiness. Do not make important decisions or sign legal documents. Do not take care of children on your own.  Eating and drinking Follow the diet recommended by your health care provider. Drink enough fluid to keep your urine pale yellow. If you vomit: Drink water, juice, or soup when you can drink without vomiting. Make sure you have little or no nausea before eating solid foods.  General instructions Take  over-the-counter and prescription medicines only as told by your health care provider. Have a responsible adult stay with you for the time you are told. It is important to have someone help care for you until you are awake and alert. Do not smoke. Keep all follow-up visits as told by your health care provider. This is important.  Contact a health care provider if: You are still sleepy or having trouble with balance after 24 hours. You feel light-headed. You keep feeling nauseous or you keep vomiting. You develop a rash. You have a fever. You have redness or swelling around the IV site.  Get help right away if: You have trouble breathing. You have new-onset confusion at home.  This information is not intended to replace advice given to you by your health care provider. Make sure you discuss any questions you have with your healthcare provider.

## 2023-05-17 NOTE — Procedures (Signed)
  Procedure:  L internal jugular port catheter placement   Preprocedure diagnosis: Diagnoses of Malignant neoplasm of unspecified part of unspecified bronchus or lung (HCC), Lung mass, and Preop testing were pertinent to this visit. Postprocedure diagnosis: same EBL:    minimal Complications:   none immediate  See full dictation in [employer].  Nicky Barrack MD Main # [phone] Pager  [phone] Mobile [phone]

## 2023-05-18 ENCOUNTER — Telehealth: Payer: Self-pay | Admitting: Medical Oncology

## 2023-05-18 ENCOUNTER — Ambulatory Visit
Admission: RE | Admit: 2023-05-18 | Discharge: 2023-05-18 | Disposition: A | Discharge status: Home / Self Care | Source: Ambulatory Visit | Attending: Radiation Oncology | Admitting: Radiation Oncology

## 2023-05-18 ENCOUNTER — Telehealth: Payer: Self-pay | Admitting: Radiation Oncology

## 2023-05-18 ENCOUNTER — Other Ambulatory Visit: Payer: Self-pay

## 2023-05-18 DIAGNOSIS — Z510 Encounter for antineoplastic radiation therapy: Secondary | ICD-10-CM | POA: Diagnosis not present

## 2023-05-18 DIAGNOSIS — R300 Dysuria: Secondary | ICD-10-CM | POA: Insufficient documentation

## 2023-05-18 DIAGNOSIS — C3411 Malignant neoplasm of upper lobe, right bronchus or lung: Secondary | ICD-10-CM | POA: Diagnosis not present

## 2023-05-18 DIAGNOSIS — Z87891 Personal history of nicotine dependence: Secondary | ICD-10-CM | POA: Diagnosis not present

## 2023-05-18 LAB — RAD ONC ARIA SESSION SUMMARY
Course Elapsed Days: 29
Plan Fractions Treated to Date: 10
Plan Prescribed Dose Per Fraction: 1.8 Gy
Plan Total Fractions Prescribed: 17
Plan Total Prescribed Dose: 30.6 Gy
Reference Point Dosage Given to Date: 18 Gy
Reference Point Session Dosage Given: 1.8 Gy
Session Number: 18

## 2023-05-18 LAB — GLUCOSE, CAPILLARY: Glucose-Capillary: 59 mg/dL — ABNORMAL LOW (ref 70–99)

## 2023-05-18 NOTE — Telephone Encounter (Signed)
 Port a cath site -XRT nurse asked me to look at pt port a cath site-postop day 1. Pt reported pain to her. It was described as red and sore. XRT nurse removed dressing over port and pt told her that the site  felt better . I looked at the site and it is intact with redness around parameter - She may have skin sensitivity to the dressing. I instructed pt to keep site uncovered and to call

## 2023-05-18 NOTE — Telephone Encounter (Signed)
 5/1 Received fax from [facility].  Patient called to speak to someone due to having a port put in on yesterday and dressing was painful and wondering if safe to remove dressing.  Secure chat sent to Nickey/Tarra, so they are aware.

## 2023-05-19 ENCOUNTER — Ambulatory Visit
Admission: RE | Admit: 2023-05-19 | Discharge: 2023-05-19 | Disposition: A | Discharge status: Home / Self Care | Source: Ambulatory Visit | Attending: Radiation Oncology | Admitting: Radiation Oncology

## 2023-05-19 ENCOUNTER — Other Ambulatory Visit: Payer: Self-pay

## 2023-05-19 ENCOUNTER — Encounter: Payer: Self-pay | Admitting: Internal Medicine

## 2023-05-19 ENCOUNTER — Telehealth: Payer: Self-pay | Admitting: Internal Medicine

## 2023-05-19 DIAGNOSIS — R300 Dysuria: Secondary | ICD-10-CM | POA: Diagnosis not present

## 2023-05-19 DIAGNOSIS — Z510 Encounter for antineoplastic radiation therapy: Secondary | ICD-10-CM | POA: Diagnosis not present

## 2023-05-19 DIAGNOSIS — C3411 Malignant neoplasm of upper lobe, right bronchus or lung: Secondary | ICD-10-CM | POA: Diagnosis not present

## 2023-05-19 DIAGNOSIS — Z87891 Personal history of nicotine dependence: Secondary | ICD-10-CM | POA: Diagnosis not present

## 2023-05-19 LAB — RAD ONC ARIA SESSION SUMMARY
Course Elapsed Days: 30
Plan Fractions Treated to Date: 11
Plan Prescribed Dose Per Fraction: 1.8 Gy
Plan Total Fractions Prescribed: 17
Plan Total Prescribed Dose: 30.6 Gy
Reference Point Dosage Given to Date: 19.8 Gy
Reference Point Session Dosage Given: 1.8 Gy
Session Number: 19

## 2023-05-19 NOTE — Telephone Encounter (Signed)
 Rescheduled appointments per availability. Left the patient a voicemail with the changed appointment details. The patient will be mailed an appointment remeinder.

## 2023-05-22 ENCOUNTER — Inpatient Hospital Stay

## 2023-05-22 ENCOUNTER — Ambulatory Visit: Admitting: Internal Medicine

## 2023-05-22 ENCOUNTER — Ambulatory Visit
Admission: RE | Admit: 2023-05-22 | Discharge: 2023-05-22 | Disposition: A | Discharge status: Home / Self Care | Source: Ambulatory Visit | Attending: Radiation Oncology | Admitting: Radiation Oncology

## 2023-05-22 ENCOUNTER — Other Ambulatory Visit

## 2023-05-22 ENCOUNTER — Inpatient Hospital Stay: Attending: Internal Medicine

## 2023-05-22 ENCOUNTER — Other Ambulatory Visit: Payer: Self-pay

## 2023-05-22 ENCOUNTER — Ambulatory Visit

## 2023-05-22 VITALS — BP 120/63 | HR 73 | Temp 97.9°F | Resp 16 | Wt 170.4 lb

## 2023-05-22 DIAGNOSIS — R300 Dysuria: Secondary | ICD-10-CM | POA: Diagnosis not present

## 2023-05-22 DIAGNOSIS — Z95828 Presence of other vascular implants and grafts: Secondary | ICD-10-CM | POA: Insufficient documentation

## 2023-05-22 DIAGNOSIS — C3411 Malignant neoplasm of upper lobe, right bronchus or lung: Secondary | ICD-10-CM

## 2023-05-22 DIAGNOSIS — Z87891 Personal history of nicotine dependence: Secondary | ICD-10-CM | POA: Diagnosis not present

## 2023-05-22 DIAGNOSIS — Z510 Encounter for antineoplastic radiation therapy: Secondary | ICD-10-CM | POA: Diagnosis not present

## 2023-05-22 LAB — CMP (CANCER CENTER ONLY)
ALT: 6 U/L (ref 0–44)
AST: 11 U/L — ABNORMAL LOW (ref 15–41)
Albumin: 3.1 g/dL — ABNORMAL LOW (ref 3.5–5.0)
Alkaline Phosphatase: 85 U/L (ref 38–126)
Anion gap: 4 — ABNORMAL LOW (ref 5–15)
BUN: 5 mg/dL — ABNORMAL LOW (ref 8–23)
CO2: 30 mmol/L (ref 22–32)
Calcium: 8.2 mg/dL — ABNORMAL LOW (ref 8.9–10.3)
Chloride: 105 mmol/L (ref 98–111)
Creatinine: 0.57 mg/dL (ref 0.44–1.00)
GFR, Estimated: >60 mL/min (ref >60)
Glucose, Bld: 77 mg/dL (ref 70–99)
Potassium: 3.9 mmol/L (ref 3.5–5.1)
Sodium: 139 mmol/L (ref 135–145)
Total Bilirubin: 0.8 mg/dL (ref 0.0–1.2)
Total Protein: 7.1 g/dL (ref 6.5–8.1)

## 2023-05-22 LAB — CBC WITH DIFFERENTIAL (CANCER CENTER ONLY)
Abs Immature Granulocytes: 0.03 10*3/uL (ref 0.00–0.07)
Basophils Absolute: 0 10*3/uL (ref 0.0–0.1)
Basophils Relative: 0 %
Eosinophils Absolute: 0.1 10*3/uL (ref 0.0–0.5)
Eosinophils Relative: 2 %
HCT: 33 % — ABNORMAL LOW (ref 36.0–46.0)
Hemoglobin: 10.6 g/dL — ABNORMAL LOW (ref 12.0–15.0)
Immature Granulocytes: 1 %
Lymphocytes Relative: 17 %
Lymphs Abs: 0.6 10*3/uL — ABNORMAL LOW (ref 0.7–4.0)
MCH: 27.1 pg (ref 26.0–34.0)
MCHC: 32.1 g/dL (ref 30.0–36.0)
MCV: 84.4 fL (ref 80.0–100.0)
Monocytes Absolute: 0.2 10*3/uL (ref 0.1–1.0)
Monocytes Relative: 5 %
Neutro Abs: 2.5 10*3/uL (ref 1.7–7.7)
Neutrophils Relative %: 75 %
Platelet Count: 256 10*3/uL (ref 150–400)
RBC: 3.91 MIL/uL (ref 3.87–5.11)
RDW: 17 % — ABNORMAL HIGH (ref 11.5–15.5)
WBC Count: 3.3 10*3/uL — ABNORMAL LOW (ref 4.0–10.5)
nRBC: 0 % (ref 0.0–0.2)

## 2023-05-22 LAB — RAD ONC ARIA SESSION SUMMARY
Course Elapsed Days: 33
Plan Fractions Treated to Date: 12
Plan Prescribed Dose Per Fraction: 1.8 Gy
Plan Total Fractions Prescribed: 17
Plan Total Prescribed Dose: 30.6 Gy
Reference Point Dosage Given to Date: 21.6 Gy
Reference Point Session Dosage Given: 1.8 Gy
Session Number: 20

## 2023-05-22 MED ORDER — SODIUM CHLORIDE 0.9 % IV SOLN: INTRAVENOUS | Status: DC

## 2023-05-22 MED ORDER — SODIUM CHLORIDE 0.9% FLUSH
10.0000 mL | Freq: Once | INTRAVENOUS | Status: AC
  Administered 2023-05-22: 10 mL

## 2023-05-22 MED ORDER — CETIRIZINE HCL 10 MG/ML IV SOLN
10.0000 mg | Freq: Once | INTRAVENOUS | Status: AC
  Administered 2023-05-22: 10 mg via INTRAVENOUS
  Filled 2023-05-22: qty 1

## 2023-05-22 MED ORDER — DEXAMETHASONE SODIUM PHOSPHATE 10 MG/ML IJ SOLN
10.0000 mg | Freq: Once | INTRAMUSCULAR | Status: AC
  Administered 2023-05-22: 10 mg via INTRAVENOUS
  Filled 2023-05-22: qty 1

## 2023-05-22 MED ORDER — SODIUM CHLORIDE 0.9 % IV SOLN
162.4000 mg | Freq: Once | INTRAVENOUS | Status: AC
  Administered 2023-05-22: 160 mg via INTRAVENOUS
  Filled 2023-05-22: qty 16

## 2023-05-22 MED ORDER — PALONOSETRON HCL INJECTION 0.25 MG/5ML
0.2500 mg | Freq: Once | INTRAVENOUS | Status: AC
Start: 2023-05-22 — End: 2023-05-22
  Administered 2023-05-22: 0.25 mg via INTRAVENOUS
  Filled 2023-05-22: qty 5

## 2023-05-22 MED ORDER — SODIUM CHLORIDE 0.9 % IV SOLN
45.0000 mg/m2 | Freq: Once | INTRAVENOUS | Status: AC
  Administered 2023-05-22: 84 mg via INTRAVENOUS
  Filled 2023-05-22: qty 14

## 2023-05-22 MED ORDER — SODIUM CHLORIDE 0.9% FLUSH: 10.0000 mL | INTRAVENOUS | Status: DC | PRN

## 2023-05-22 MED ORDER — HEPARIN SOD (PORK) LOCK FLUSH 100 UNIT/ML IV SOLN: 500.0000 [IU] | Freq: Once | INTRAVENOUS | Status: DC | PRN

## 2023-05-22 MED ORDER — FAMOTIDINE IN NACL 20-0.9 MG/50ML-% IV SOLN
20.0000 mg | Freq: Once | INTRAVENOUS | Status: AC
  Administered 2023-05-22: 20 mg via INTRAVENOUS
  Filled 2023-05-22: qty 50

## 2023-05-22 NOTE — Patient Instructions (Signed)
 [facility] - A DEPT OF [facility]  Discharge Instructions: Thank you for choosing [facility] to provide your oncology and hematology care.   If you have a lab appointment with the Cancer Center, please go directly to the Cancer Center and check in at the registration area.   Wear comfortable clothing and clothing appropriate for easy access to any Portacath or PICC line.   We strive to give you quality time with your provider. You may need to reschedule your appointment if you arrive late (15 or more minutes).  Arriving late affects you and other patients whose appointments are after yours.  Also, if you miss three or more appointments without notifying the office, you may be dismissed from the clinic at the provider's discretion.      For prescription refill requests, have your pharmacy contact our office and allow 72 hours for refills to be completed.    Today you received the following chemotherapy and/or immunotherapy agents: Paclitaxel, Carboplatin      To help prevent nausea and vomiting after your treatment, we encourage you to take your nausea medication as directed.  BELOW ARE SYMPTOMS THAT SHOULD BE REPORTED IMMEDIATELY: *FEVER GREATER THAN 100.4 F (38 C) OR HIGHER *CHILLS OR SWEATING *NAUSEA AND VOMITING THAT IS NOT CONTROLLED WITH YOUR NAUSEA MEDICATION *UNUSUAL SHORTNESS OF BREATH *UNUSUAL BRUISING OR BLEEDING *URINARY PROBLEMS (pain or burning when urinating, or frequent urination) *BOWEL PROBLEMS (unusual diarrhea, constipation, pain near the anus) TENDERNESS IN MOUTH AND THROAT WITH OR WITHOUT PRESENCE OF ULCERS (sore throat, sores in mouth, or a toothache) UNUSUAL RASH, SWELLING OR PAIN  UNUSUAL VAGINAL DISCHARGE OR ITCHING   Items with * indicate a potential emergency and should be followed up as soon as possible or go to the Emergency Department if any problems should occur.  Please show the CHEMOTHERAPY ALERT CARD or  IMMUNOTHERAPY ALERT CARD at check-in to the Emergency Department and triage nurse.  Should you have questions after your visit or need to cancel or reschedule your appointment, please contact [facility] - A DEPT OF [facility]  Dept: [phone]  and follow the prompts.  Office hours are 8:00 a.m. to 4:30 p.m. Monday - Friday. Please note that voicemails left after 4:00 p.m. may not be returned until the following business day.  We are closed weekends and major holidays. You have access to a nurse at all times for urgent questions. Please call the main number to the clinic Dept: [phone] and follow the prompts.   For any non-urgent questions, you may also contact your provider using MyChart. We now offer e-Visits for anyone 62 and older to request care online for non-urgent symptoms. For details visit [email or web address].   Also download the MyChart app! Go to the app store, search "MyChart", open the app, select , and log in with your MyChart username and password.  Paclitaxel Injection What is this medication? PACLITAXEL (PAK li TAX el) treats some types of cancer. It works by slowing down the growth of cancer cells. This medicine may be used for other purposes; ask your health care provider or pharmacist if you have questions. COMMON BRAND NAME(S): Onxol, Taxol What should I tell my care team before I take this medication? They need to know if you have any of these conditions: Heart disease Liver disease Low white blood cell levels An unusual or allergic reaction to paclitaxel, other medications,  foods, dyes, or preservatives If you or your partner are pregnant or trying to get pregnant Breast-feeding How should I use this medication? This medication is injected into a vein. It is given by your care team in a hospital or clinic setting. Talk to your care team about the use of this medication in children. While it may be given to children  for selected conditions, precautions do apply. Overdosage: If you think you have taken too much of this medicine contact a poison control center or emergency room at once. NOTE: This medicine is only for you. Do not share this medicine with others. What if I miss a dose? Keep appointments for follow-up doses. It is important not to miss your dose. Call your care team if you are unable to keep an appointment. What may interact with this medication? Do not take this medication with any of the following: Live virus vaccines Other medications may affect the way this medication works. Talk with your care team about all of the medications you take. They may suggest changes to your treatment plan to lower the risk of side effects and to make sure your medications work as intended. This list may not describe all possible interactions. Give your health care provider a list of all the medicines, herbs, non-prescription drugs, or dietary supplements you use. Also tell them if you smoke, drink alcohol, or use illegal drugs. Some items may interact with your medicine. What should I watch for while using this medication? Your condition will be monitored carefully while you are receiving this medication. You may need blood work while taking this medication. This medication may make you feel generally unwell. This is not uncommon as chemotherapy can affect healthy cells as well as cancer cells. Report any side effects. Continue your course of treatment even though you feel ill unless your care team tells you to stop. This medication can cause serious allergic reactions. To reduce the risk, your care team may give you other medications to take before receiving this one. Be sure to follow the directions from your care team. This medication may increase your risk of getting an infection. Call your care team for advice if you get a fever, chills, sore throat, or other symptoms of a cold or flu. Do not treat yourself. Try  to avoid being around people who are sick. This medication may increase your risk to bruise or bleed. Call your care team if you notice any unusual bleeding. Be careful brushing or flossing your teeth or using a toothpick because you may get an infection or bleed more easily. If you have any dental work done, tell your dentist you are receiving this medication. Talk to your care team if you may be pregnant. Serious birth defects can occur if you take this medication during pregnancy. Talk to your care team before breastfeeding. Changes to your treatment plan may be needed. What side effects may I notice from receiving this medication? Side effects that you should report to your care team as soon as possible: Allergic reactions--skin rash, itching, hives, swelling of the face, lips, tongue, or throat Heart rhythm changes--fast or irregular heartbeat, dizziness, feeling faint or lightheaded, chest pain, trouble breathing Increase in blood pressure Infection--fever, chills, cough, sore throat, wounds that don't heal, pain or trouble when passing urine, general feeling of discomfort or being unwell Low blood pressure--dizziness, feeling faint or lightheaded, blurry vision Low red blood cell level--unusual weakness or fatigue, dizziness, headache, trouble breathing Painful swelling, warmth, or redness  of the skin, blisters or sores at the infusion site Pain, tingling, or numbness in the hands or feet Slow heartbeat--dizziness, feeling faint or lightheaded, confusion, trouble breathing, unusual weakness or fatigue Unusual bruising or bleeding Side effects that usually do not require medical attention (report to your care team if they continue or are bothersome): Diarrhea Hair loss Joint pain Loss of appetite Muscle pain Nausea Vomiting This list may not describe all possible side effects. Call your doctor for medical advice about side effects. You may report side effects to FDA at  1-800-FDA-1088. Where should I keep my medication? This medication is given in a hospital or clinic. It will not be stored at home. NOTE: This sheet is a summary. It may not cover all possible information. If you have questions about this medicine, talk to your doctor, pharmacist, or health care provider.  2024 Elsevier/Gold Standard (2021-05-25 00:00:00)  Carboplatin Injection What is this medication? CARBOPLATIN (KAR boe pla tin) treats some types of cancer. It works by slowing down the growth of cancer cells. This medicine may be used for other purposes; ask your health care provider or pharmacist if you have questions. COMMON BRAND NAME(S): Paraplatin What should I tell my care team before I take this medication? They need to know if you have any of these conditions: Blood disorders Hearing problems Kidney disease Recent or ongoing radiation therapy An unusual or allergic reaction to carboplatin, cisplatin, other medications, foods, dyes, or preservatives Pregnant or trying to get pregnant Breast-feeding How should I use this medication? This medication is injected into a vein. It is given by your care team in a hospital or clinic setting. Talk to your care team about the use of this medication in children. Special care may be needed. Overdosage: If you think you have taken too much of this medicine contact a poison control center or emergency room at once. NOTE: This medicine is only for you. Do not share this medicine with others. What if I miss a dose? Keep appointments for follow-up doses. It is important not to miss your dose. Call your care team if you are unable to keep an appointment. What may interact with this medication? Medications for seizures Some antibiotics, such as amikacin, gentamicin, neomycin, streptomycin, tobramycin Vaccines This list may not describe all possible interactions. Give your health care provider a list of all the medicines, herbs,  non-prescription drugs, or dietary supplements you use. Also tell them if you smoke, drink alcohol, or use illegal drugs. Some items may interact with your medicine. What should I watch for while using this medication? Your condition will be monitored carefully while you are receiving this medication. You may need blood work while taking this medication. This medication may make you feel generally unwell. This is not uncommon, as chemotherapy can affect healthy cells as well as cancer cells. Report any side effects. Continue your course of treatment even though you feel ill unless your care team tells you to stop. In some cases, you may be given additional medications to help with side effects. Follow all directions for their use. This medication may increase your risk of getting an infection. Call your care team for advice if you get a fever, chills, sore throat, or other symptoms of a cold or flu. Do not treat yourself. Try to avoid being around people who are sick. Avoid taking medications that contain aspirin, acetaminophen, ibuprofen, naproxen, or ketoprofen unless instructed by your care team. These medications may hide a fever. Be careful  brushing or flossing your teeth or using a toothpick because you may get an infection or bleed more easily. If you have any dental work done, tell your dentist you are receiving this medication. Talk to your care team if you wish to become pregnant or think you might be pregnant. This medication can cause serious birth defects. Talk to your care team about effective forms of contraception. Do not breast-feed while taking this medication. What side effects may I notice from receiving this medication? Side effects that you should report to your care team as soon as possible: Allergic reactions--skin rash, itching, hives, swelling of the face, lips, tongue, or throat Infection--fever, chills, cough, sore throat, wounds that don't heal, pain or trouble when passing  urine, general feeling of discomfort or being unwell Low red blood cell level--unusual weakness or fatigue, dizziness, headache, trouble breathing Pain, tingling, or numbness in the hands or feet, muscle weakness, change in vision, confusion or trouble speaking, loss of balance or coordination, trouble walking, seizures Unusual bruising or bleeding Side effects that usually do not require medical attention (report to your care team if they continue or are bothersome): Hair loss Nausea Unusual weakness or fatigue Vomiting This list may not describe all possible side effects. Call your doctor for medical advice about side effects. You may report side effects to FDA at 1-800-FDA-1088. Where should I keep my medication? This medication is given in a hospital or clinic. It will not be stored at home. NOTE: This sheet is a summary. It may not cover all possible information. If you have questions about this medicine, talk to your doctor, pharmacist, or health care provider.  2024 Elsevier/Gold Standard (2021-04-27 00:00:00)

## 2023-05-23 ENCOUNTER — Ambulatory Visit
Admission: RE | Admit: 2023-05-23 | Discharge: 2023-05-23 | Disposition: A | Discharge status: Home / Self Care | Source: Ambulatory Visit | Attending: Radiation Oncology | Admitting: Radiation Oncology

## 2023-05-23 ENCOUNTER — Other Ambulatory Visit: Payer: Self-pay

## 2023-05-23 DIAGNOSIS — Z87891 Personal history of nicotine dependence: Secondary | ICD-10-CM | POA: Diagnosis not present

## 2023-05-23 DIAGNOSIS — R300 Dysuria: Secondary | ICD-10-CM | POA: Diagnosis not present

## 2023-05-23 DIAGNOSIS — Z510 Encounter for antineoplastic radiation therapy: Secondary | ICD-10-CM | POA: Diagnosis not present

## 2023-05-23 DIAGNOSIS — C3411 Malignant neoplasm of upper lobe, right bronchus or lung: Secondary | ICD-10-CM | POA: Diagnosis not present

## 2023-05-23 LAB — RAD ONC ARIA SESSION SUMMARY
Course Elapsed Days: 34
Plan Fractions Treated to Date: 13
Plan Prescribed Dose Per Fraction: 1.8 Gy
Plan Total Fractions Prescribed: 17
Plan Total Prescribed Dose: 30.6 Gy
Reference Point Dosage Given to Date: 23.4 Gy
Reference Point Session Dosage Given: 1.8 Gy
Session Number: 21

## 2023-05-24 ENCOUNTER — Ambulatory Visit
Admission: RE | Admit: 2023-05-24 | Discharge: 2023-05-24 | Disposition: A | Discharge status: Home / Self Care | Source: Ambulatory Visit | Attending: Radiation Oncology | Admitting: Radiation Oncology

## 2023-05-24 ENCOUNTER — Other Ambulatory Visit: Payer: Self-pay

## 2023-05-24 ENCOUNTER — Ambulatory Visit

## 2023-05-24 DIAGNOSIS — Z87891 Personal history of nicotine dependence: Secondary | ICD-10-CM | POA: Diagnosis not present

## 2023-05-24 DIAGNOSIS — C3411 Malignant neoplasm of upper lobe, right bronchus or lung: Secondary | ICD-10-CM | POA: Diagnosis not present

## 2023-05-24 DIAGNOSIS — Z510 Encounter for antineoplastic radiation therapy: Secondary | ICD-10-CM | POA: Diagnosis not present

## 2023-05-24 LAB — RAD ONC ARIA SESSION SUMMARY
Course Elapsed Days: 35
Plan Fractions Treated to Date: 14
Plan Prescribed Dose Per Fraction: 1.8 Gy
Plan Total Fractions Prescribed: 17
Plan Total Prescribed Dose: 30.6 Gy
Reference Point Dosage Given to Date: 25.2 Gy
Reference Point Session Dosage Given: 1.8 Gy
Session Number: 22

## 2023-05-25 ENCOUNTER — Ambulatory Visit

## 2023-05-25 ENCOUNTER — Ambulatory Visit
Admission: RE | Admit: 2023-05-25 | Discharge: 2023-05-25 | Disposition: A | Discharge status: Home / Self Care | Source: Ambulatory Visit | Attending: Radiation Oncology | Admitting: Radiation Oncology

## 2023-05-25 ENCOUNTER — Other Ambulatory Visit: Payer: Self-pay

## 2023-05-25 DIAGNOSIS — R300 Dysuria: Secondary | ICD-10-CM | POA: Diagnosis not present

## 2023-05-25 DIAGNOSIS — Z87891 Personal history of nicotine dependence: Secondary | ICD-10-CM | POA: Diagnosis not present

## 2023-05-25 DIAGNOSIS — C3411 Malignant neoplasm of upper lobe, right bronchus or lung: Secondary | ICD-10-CM | POA: Diagnosis not present

## 2023-05-25 DIAGNOSIS — Z510 Encounter for antineoplastic radiation therapy: Secondary | ICD-10-CM | POA: Diagnosis not present

## 2023-05-25 LAB — RAD ONC ARIA SESSION SUMMARY
Course Elapsed Days: 36
Plan Fractions Treated to Date: 15
Plan Prescribed Dose Per Fraction: 1.8 Gy
Plan Total Fractions Prescribed: 17
Plan Total Prescribed Dose: 30.6 Gy
Reference Point Dosage Given to Date: 27 Gy
Reference Point Session Dosage Given: 1.8 Gy
Session Number: 23

## 2023-05-26 ENCOUNTER — Ambulatory Visit

## 2023-05-26 ENCOUNTER — Ambulatory Visit
Admission: RE | Admit: 2023-05-26 | Discharge: 2023-05-26 | Disposition: A | Discharge status: Home / Self Care | Source: Ambulatory Visit | Attending: Radiation Oncology | Admitting: Radiation Oncology

## 2023-05-26 ENCOUNTER — Ambulatory Visit: Payer: Medicare HMO | Admitting: Internal Medicine

## 2023-05-26 ENCOUNTER — Other Ambulatory Visit: Payer: Self-pay

## 2023-05-26 DIAGNOSIS — Z87891 Personal history of nicotine dependence: Secondary | ICD-10-CM | POA: Diagnosis not present

## 2023-05-26 DIAGNOSIS — R300 Dysuria: Secondary | ICD-10-CM | POA: Diagnosis not present

## 2023-05-26 DIAGNOSIS — Z510 Encounter for antineoplastic radiation therapy: Secondary | ICD-10-CM | POA: Diagnosis not present

## 2023-05-26 DIAGNOSIS — C3411 Malignant neoplasm of upper lobe, right bronchus or lung: Secondary | ICD-10-CM | POA: Diagnosis not present

## 2023-05-26 LAB — RAD ONC ARIA SESSION SUMMARY
Course Elapsed Days: 37
Plan Fractions Treated to Date: 16
Plan Prescribed Dose Per Fraction: 1.8 Gy
Plan Total Fractions Prescribed: 17
Plan Total Prescribed Dose: 30.6 Gy
Reference Point Dosage Given to Date: 28.8 Gy
Reference Point Session Dosage Given: 1.8 Gy
Session Number: 24

## 2023-05-26 NOTE — Progress Notes (Unsigned)
 [facility] OFFICE PROGRESS NOTE  Brandi Coombe, MD [address] [place] [address]  DIAGNOSIS: Stage IIIB (T4, N2b, M0) non-small cell lung cancer, squamous cell carcinoma. She presented with large pleural-based peripheral right upper lobe lung mass measuring up to 8.2 cm in size with large right paratracheal node with low level hypermetabolic uptake.   PDL1: 15  PRIOR THERAPY: None  CURRENT THERAPY: concurrent chemoradiation with carboplatin  for an AUC of 2 and taxol  45 mg/m2. First dose on 05/14/23. Status post 3 cycles.   INTERVAL HISTORY: [name] 78 y.o. female returns to the clinic today for a follow up visit. She is currently undergoing treatment with concurrent chemoradiation.  Her last day radiation was on 05/29/23.   She was undergoing chemotherapy which is radiosensitizing.  She is status post 3 weeks of treatment which she tolerated ***.   She initially presented with lower rib pain for which her pain is well-controlled she takes oxycodone .  She denies any fever, chills, night sweats, or unexplained weight loss.  Her breathing is***.  Cough?  Now and then when lying down?  She denies any hemoptysis.  She denies any nausea, vomiting, or diarrhea.  She gets intermittent constipation.  She denies any headache or visual changes.      MEDICAL HISTORY: Past Medical History:  Diagnosis Date   AAA (abdominal aortic aneurysm) (HCC)    a. s/p stent graft repair 01/2016.   Acute ischemic colitis (HCC) 07/29/2010   Diagnosed June, 2012 characterized by acute lower GI bleeding, spontaneously resolved.    Acute respiratory infection 06/09/2014   Acute sinus infection 05/14/2015   Allergic rhinitis, cause unspecified    Angioedema of lips 06/03/2014   Anxiety state, unspecified    Arthritis of left hip 04/16/2015   Bilateral hearing loss 07/19/2017   Chronic LBP    Coronary artery disease    a. inferior MI 1998 s/p PCI of RCA. b. stenting of Cx 04/2010. c. DES  to prox LAD 05/2013. d. DES x 3 in 10/2015. // Myoview  07/2018:  EF 59, normal perfusion, low risk    Coronary artery disease involving native heart without angina pectoris 07/31/2006   Qualifier: Diagnosis of  By: Edmonia Gottron MD, Beauford Bounds  ANGIOGRAPHIC DATA:  1. Ventriculography done in the RAO projection reveals a small wall  motion abnormality in the mid inferior wall. Ejection fraction  would be estimated at around 50%.  2. The right coronary artery has some progressive disease of about 60-  80% in the proximal mid segment. The distal vessel appears to be  50-70% narrowing althou   Degeneration of lumbar or lumbosacral intervertebral disc    Depressive disorder, not elsewhere classified    Diabetes (HCC) 11/03/2006   Qualifier: Diagnosis of  By: Autry Legions MD, Alveda Aures    Diabetes mellitus    TYPE II   Frequent urination 05/08/2014   Gout 08/22/2013   History of endovascular stent graft for abdominal aortic aneurysm (AAA) 01/22/2016   Hyperlipidemia    Hypertension    Hypothyroidism    Iron  deficiency anemia 06/13/2017   Lower GI bleed 06/2010   Diverticular bleed   Lumbar radiculopathy 05/15/2015   MENOPAUSAL DISORDER 10/29/2007   Qualifier: Diagnosis of  By: Autry Legions MD, Alveda Aures    Myalgia 09/25/2013   Noncompliance with medications 02/26/2014   Obesity, unspecified    OTITIS MEDIA, SEROUS, CHRONIC 04/23/2007   Qualifier: Diagnosis of  By: Edmonia Gottron MD, Michael E  Thoracic disc disease 11/19/2018   URTICARIA 09/23/2009   Qualifier: Diagnosis of  By: Myrtice Athens MD, Tarri Farm A    Venous insufficiency 07/19/2017    ALLERGIES:  is allergic to crestor  [rosuvastatin  calcium ], prilosec [omeprazole ], miconazole nitrate, augmentin  [amoxicillin -pot clavulanate], and doxycycline .  MEDICATIONS:  Current Outpatient Medications  Medication Sig Dispense Refill   aspirin  81 MG EC tablet Take 81 mg by mouth daily.      atenolol  (TENORMIN ) 25 MG tablet TAKE 1 TABLET BY MOUTH EVERY DAY. 90 tablet 1   atorvastatin  (LIPITOR ) 40 MG  tablet Take 1 tablet (40 mg total) by mouth daily. (Patient not taking: Reported on 03/21/2023) 90 tablet 3   fluconazole  (DIFLUCAN ) 150 MG tablet Take one tab by mouth every 3 days as needed (Patient not taking: Reported on 05/09/2023) 2 tablet 1   Foot Care Products ([employer] GEL HEEL CUSHION WOMENS) PADS Use as directed at bedtime 2 each 5   gabapentin  (NEURONTIN ) 300 MG capsule Take 1-2 tab by mouth at bedtime for pain and sleep (Patient taking differently: Take 300 mg by mouth as needed. Take 1-2 tab by mouth at bedtime for pain and sleep) 180 capsule 1   HYDROcodone -acetaminophen  (NORCO) 10-325 MG tablet Take 1 tablet by mouth every 8 (eight) hours as needed. 60 tablet 0   isosorbide  mononitrate (IMDUR ) 30 MG 24 hr tablet Take 0.5 tablets (15 mg total) by mouth daily. 30 tablet 0   levofloxacin  (LEVAQUIN ) 500 MG tablet Take 1 tablet (500 mg total) by mouth daily. 10 tablet 0   levothyroxine  (SYNTHROID ) 100 MCG tablet Take 1 tablet (100 mcg total) by mouth daily before breakfast. 30 tablet 0   lidocaine -prilocaine  (EMLA ) cream Apply 1 Application topically as needed. 30 g 2   phenazopyridine  (PYRIDIUM ) 200 MG tablet Take 1 tablet (200 mg total) by mouth 3 (three) times daily as needed for pain (with urination). 25 tablet 0   prochlorperazine  (COMPAZINE ) 10 MG tablet Take 1 tablet (10 mg total) by mouth every 6 (six) hours as needed. 30 tablet 2   temazepam  (RESTORIL ) 15 MG capsule Take 1 capsule (15 mg total) by mouth at bedtime as needed for sleep. 30 capsule 2   Triamcinolone  Acetonide (ZILRETTA ) 32 MG SRER intra-articular injection 5 mL, intra-articular, bilat knees 2 each 0   No current facility-administered medications for this visit.    SURGICAL HISTORY:  Past Surgical History:  Procedure Laterality Date   CARDIAC CATHETERIZATION     PCI OF BOTH THE CIRCUMFLEX AND LEFT ANTERIOR DESCENDING ARTERY   CARDIAC CATHETERIZATION N/A 10/29/2015   Procedure: Coronary Stent Intervention;  Surgeon:  Sammy Crisp, MD;  Location: [facility] INVASIVE CV LAB;  Service: Cardiovascular;  Laterality: N/A;   CARDIAC CATHETERIZATION N/A 10/29/2015   Procedure: Coronary/Graft Angiography;  Surgeon: Sammy Crisp, MD;  Location: [facility] INVASIVE CV LAB;  Service: Cardiovascular;  Laterality: N/A;   CARDIAC CATHETERIZATION N/A 10/29/2015   Procedure: Intravascular Pressure Wire/FFR Study;  Surgeon: Sammy Crisp, MD;  Location: [facility] INVASIVE CV LAB;  Service: Cardiovascular;  Laterality: N/A;   CESAREAN SECTION     ENDOVASCULAR STENT INSERTION N/A 01/22/2016   Procedure: ABDOMINAL AORTIC ENDOVASCULAR STENT GRAFT INSERTION;  Surgeon: Mayo Speck, MD;  Location: [facility] OR;  Service: Vascular;  Laterality: N/A;   HEART STENT  [phone]  and  Jun 07, 2013   X 3   IR IMAGING GUIDED PORT INSERTION  05/17/2023   LEFT HEART CATH AND CORONARY ANGIOGRAPHY N/A 04/03/2019   Procedure: LEFT HEART CATH  AND CORONARY ANGIOGRAPHY;  Surgeon: Sammy Crisp, MD;  Location: [facility] INVASIVE CV LAB;  Service: Cardiovascular;  Laterality: N/A;   LEFT HEART CATHETERIZATION WITH CORONARY ANGIOGRAM N/A 06/07/2013   Procedure: LEFT HEART CATHETERIZATION WITH CORONARY ANGIOGRAM;  Surgeon: Odie Benne, MD;  Location: [facility] CATH LAB;  Service: Cardiovascular;  Laterality: N/A;   LEFT HEART CATHETERIZATION WITH CORONARY ANGIOGRAM N/A 02/25/2014   Procedure: LEFT HEART CATHETERIZATION WITH CORONARY ANGIOGRAM;  Surgeon: Millicent Ally, MD;  Location: [facility] CATH LAB;  Service: Cardiovascular;  Laterality: N/A;   LUMBAR FUSION  01/2007   DR. Faylene Hoots...3-LEVEL WITH FIXATION   OOPHORECTOMY     BSO? pt.unsure   PARATHYROIDECTOMY     SPINE SURGERY     THYROIDECTOMY     TOTAL ABDOMINAL HYSTERECTOMY      REVIEW OF SYSTEMS:   Review of Systems  Constitutional: Negative for appetite change, chills, fatigue, fever and unexpected weight change.  HENT:   Negative for mouth sores, nosebleeds, sore throat and trouble swallowing.   Eyes: Negative for eye  problems and icterus.  Respiratory: Negative for cough, hemoptysis, shortness of breath and wheezing.   Cardiovascular: Negative for chest pain and leg swelling.  Gastrointestinal: Negative for abdominal pain, constipation, diarrhea, nausea and vomiting.  Genitourinary: Negative for bladder incontinence, difficulty urinating, dysuria, frequency and hematuria.   Musculoskeletal: Negative for back pain, gait problem, neck pain and neck stiffness.  Skin: Negative for itching and rash.  Neurological: Negative for dizziness, extremity weakness, gait problem, headaches, light-headedness and seizures.  Hematological: Negative for adenopathy. Does not bruise/bleed easily.  Psychiatric/Behavioral: Negative for confusion, depression and sleep disturbance. The patient is not nervous/anxious.     PHYSICAL EXAMINATION:  There were no vitals taken for this visit.  ECOG PERFORMANCE STATUS: {CHL ONC ECOG [id number]  Physical Exam  Constitutional: Oriented to person, place, and time and well-developed, well-nourished, and in no distress. No distress.  HENT:  Head: Normocephalic and atraumatic.  Mouth/Throat: Oropharynx is clear and moist. No oropharyngeal exudate.  Eyes: Conjunctivae are normal. Right eye exhibits no discharge. Left eye exhibits no discharge. No scleral icterus.  Neck: Normal range of motion. Neck supple.  Cardiovascular: Normal rate, regular rhythm, normal heart sounds and intact distal pulses.   Pulmonary/Chest: Effort normal and breath sounds normal. No respiratory distress. No wheezes. No rales.  Abdominal: Soft. Bowel sounds are normal. Exhibits no distension and no mass. There is no tenderness.  Musculoskeletal: Normal range of motion. Exhibits no edema.  Lymphadenopathy:    No cervical adenopathy.  Neurological: Alert and oriented to person, place, and time. Exhibits normal muscle tone. Gait normal. Coordination normal.  Skin: Skin is warm and dry. No rash noted. Not  diaphoretic. No erythema. No pallor.  Psychiatric: Mood, memory and judgment normal.  Vitals reviewed.  LABORATORY DATA: Lab Results  Component Value Date   WBC 3.3 (L) 05/22/2023   HGB 10.6 (L) 05/22/2023   HCT 33.0 (L) 05/22/2023   MCV 84.4 05/22/2023   PLT 256 05/22/2023      Chemistry      Component Value Date/Time   NA 139 05/22/2023 1345   NA 141 06/08/2021 1606   K 3.9 05/22/2023 1345   CL 105 05/22/2023 1345   CO2 30 05/22/2023 1345   BUN 5 (L) 05/22/2023 1345   BUN 4 (L) 06/08/2021 1606   CREATININE 0.57 05/22/2023 1345      Component Value Date/Time   CALCIUM  8.2 (L) 05/22/2023 1345   ALKPHOS 85 05/22/2023  1345   AST 11 (L) 05/22/2023 1345   ALT 6 05/22/2023 1345   BILITOT 0.8 05/22/2023 1345       RADIOGRAPHIC STUDIES:  IR IMAGING GUIDED PORT INSERTION Result Date: 05/18/2023 CLINICAL DATA:  Right lung non-small cell carcinoma, needs durable venous access for planned treatment regimen EXAM: TUNNELED PORT CATHETER PLACEMENT WITH ULTRASOUND AND FLUOROSCOPIC GUIDANCE FLUOROSCOPY: Radiation Exposure Index (as provided by the fluoroscopic device): 45 mGy air Kerma ANESTHESIA/SEDATION: Intravenous Fentanyl  100mcg and Versed  4mg  were administered by RN during a total moderate (conscious) sedation time of 35 minutes; the patient's level of consciousness and physiological / cardiorespiratory status were monitored continuously by radiology RN under my direct supervision. TECHNIQUE: The procedure, risks, benefits, and alternatives were explained to the patient. Questions regarding the procedure were encouraged and answered. The patient understands and consents to the procedure. Left-sided approach was selected because of the right-sided upper chest pathology. Patency of the left IJ vein was confirmed with ultrasound with image documentation. An appropriate skin site was determined. Skin site was marked. Region was prepped using maximum barrier technique including cap and mask,  sterile gown, sterile gloves, large sterile sheet, and Chlorhexidine  as cutaneous antisepsis. The region was infiltrated locally with 1% lidocaine . Under real-time ultrasound guidance, the left IJ vein was accessed with a 21 gauge micropuncture needle; the needle tip within the vein was confirmed with ultrasound image documentation. Needle was exchanged over a 018 guidewire for transitional dilator, and vascular measurement was performed. A small incision was made on the left anterior chest wall and a subcutaneous pocket fashioned. The power-injectable port was positioned and its catheter tunneled to the left IJ dermatotomy site. The transitional dilator was exchanged over an Amplatz wire for a peel-away sheath, through which the port catheter, which had been trimmed to the appropriate length, was advanced and positioned under fluoroscopy with its tip at the cavoatrial junction. Spot chest radiograph confirms good catheter position and no pneumothorax. The port was flushed per protocol. The pocket was closed with deep interrupted and subcuticular continuous 3-0 Monocryl sutures. The incisions were covered with Dermabond then covered with a sterile dressing. The patient tolerated the procedure well. COMPLICATIONS: COMPLICATIONS None immediate IMPRESSION: Technically successful left IJ power-injectable port catheter placement. Ready for routine use. Electronically Signed   By: Nicoletta Barrier M.D.   On: 05/18/2023 12:40     ASSESSMENT/PLAN:  This is a very pleasant 78 years old African-American female with highly suspicious stage IIIB (T4, N2, M0) non-small cell lung cancer, squamous cell carcinoma.  She presented with large pleural-based peripheral right upper lobe lung mass measuring up to 8.2 cm in size with large right paratracheal node with low level hypermetabolic uptake.  She was diagnosed in March 2025.  Her PD-L1 expression is 15%.   The patient is undergoing concurrent chemoradiation with carboplatin  for  an AUC of 2 and paclitaxel  45 mg/m.  She is status post 3 weeks of treatment.  Her last radiation was on 05/29/2023.  The patient was seen with Dr. Marguerita Shih.  Recommend ***continue or stop?  I will arrange for restaging CT scan of her chest in 2 and half weeks.  We will see her back for follow-up visit 1 week later to review the results in the office and discuss the next steps in her care.  She will continue oxycodone  for pain management.  The patient was advised to call immediately if she has any concerning symptoms in the interval. The patient voices understanding of current disease  status and treatment options and is in agreement with the current care plan. All questions were answered. The patient knows to call the clinic with any problems, questions or concerns. We can certainly see the patient much sooner if necessary      No orders of the defined types were placed in this encounter.    I spent {CHL ONC TIME VISIT - ZOXWR:[id number]} counseling the patient face to face. The total time spent in the appointment was {CHL ONC TIME VISIT - BJYNW:[id number]}.  Joshu Furukawa L Pharoah Goggins, PA-C 05/26/23

## 2023-05-29 ENCOUNTER — Ambulatory Visit: Admitting: Internal Medicine

## 2023-05-29 ENCOUNTER — Other Ambulatory Visit

## 2023-05-29 ENCOUNTER — Other Ambulatory Visit: Payer: Self-pay

## 2023-05-29 ENCOUNTER — Ambulatory Visit

## 2023-05-29 ENCOUNTER — Ambulatory Visit
Admission: RE | Admit: 2023-05-29 | Discharge: 2023-05-29 | Disposition: A | Discharge status: Home / Self Care | Source: Ambulatory Visit | Attending: Radiation Oncology | Admitting: Radiation Oncology

## 2023-05-29 DIAGNOSIS — Z510 Encounter for antineoplastic radiation therapy: Secondary | ICD-10-CM | POA: Diagnosis not present

## 2023-05-29 DIAGNOSIS — R300 Dysuria: Secondary | ICD-10-CM | POA: Diagnosis not present

## 2023-05-29 DIAGNOSIS — C3411 Malignant neoplasm of upper lobe, right bronchus or lung: Secondary | ICD-10-CM | POA: Diagnosis not present

## 2023-05-29 DIAGNOSIS — Z87891 Personal history of nicotine dependence: Secondary | ICD-10-CM | POA: Diagnosis not present

## 2023-05-29 LAB — RAD ONC ARIA SESSION SUMMARY
Course Elapsed Days: 40
Plan Fractions Treated to Date: 17
Plan Prescribed Dose Per Fraction: 1.8 Gy
Plan Total Fractions Prescribed: 17
Plan Total Prescribed Dose: 30.6 Gy
Reference Point Dosage Given to Date: 30.6 Gy
Reference Point Session Dosage Given: 1.8 Gy
Session Number: 25

## 2023-05-29 NOTE — Progress Notes (Unsigned)
 Cardiology Office Note    Patient Name: [name] Date of Encounter: 05/29/2023  Primary Care Provider:  Roslyn Coombe, MD Primary Cardiologist:  Arnoldo Lapping, MD Primary Electrophysiologist: None   Past Medical History    Past Medical History:  Diagnosis Date   AAA (abdominal aortic aneurysm) [facility])    a. s/p stent graft repair 01/2016.   Acute ischemic colitis (HCC) 07/29/2010   Diagnosed June, 2012 characterized by acute lower GI bleeding, spontaneously resolved.    Acute respiratory infection 06/09/2014   Acute sinus infection 05/14/2015   Allergic rhinitis, cause unspecified    Angioedema of lips 06/03/2014   Anxiety state, unspecified    Arthritis of left hip 04/16/2015   Bilateral hearing loss 07/19/2017   Chronic LBP    Coronary artery disease    a. inferior MI 1998 s/p PCI of RCA. b. stenting of Cx 04/2010. c. DES to prox LAD 05/2013. d. DES x 3 in 10/2015. // Myoview  07/2018:  EF 59, normal perfusion, low risk    Coronary artery disease involving native heart without angina pectoris 07/31/2006   Qualifier: Diagnosis of  By: Edmonia Gottron MD, Beauford Bounds  ANGIOGRAPHIC DATA:  1. Ventriculography done in the RAO projection reveals a small wall  motion abnormality in the mid inferior wall. Ejection fraction  would be estimated at around 50%.  2. The right coronary artery has some progressive disease of about 60-  80% in the proximal mid segment. The distal vessel appears to be  50-70% narrowing althou   Degeneration of lumbar or lumbosacral intervertebral disc    Depressive disorder, not elsewhere classified    Diabetes (HCC) 11/03/2006   Qualifier: Diagnosis of  By: Autry Legions MD, Alveda Aures    Diabetes mellitus    TYPE II   Frequent urination 05/08/2014   Gout 08/22/2013   History of endovascular stent graft for abdominal aortic aneurysm (AAA) 01/22/2016   Hyperlipidemia    Hypertension    Hypothyroidism    Iron  deficiency anemia 06/13/2017   Lower GI bleed 06/2010   Diverticular bleed   Lumbar  radiculopathy 05/15/2015   MENOPAUSAL DISORDER 10/29/2007   Qualifier: Diagnosis of  By: Autry Legions MD, Alveda Aures    Myalgia 09/25/2013   Noncompliance with medications 02/26/2014   Obesity, unspecified    OTITIS MEDIA, SEROUS, CHRONIC 04/23/2007   Qualifier: Diagnosis of  By: Edmonia Gottron MD, Beauford Bounds    Thoracic disc disease 11/19/2018   URTICARIA 09/23/2009   Qualifier: Diagnosis of  By: Myrtice Athens MD, Tarri Farm A    Venous insufficiency 07/19/2017    History of Present Illness  [name] is a 78 y.o. female with a PMH of CAD s/p inferior MI 1998 treated with PCI to RCA, PCI to the left circumflex in 2012, PCI to LAD 2015 and DES x 3 to RCA in 2017, LHC 03/2019 with diffuse disease in RCA not ideal for PCI treated medically, DM type II, HTN, HLD, AAA s/p EVAR 01/2016, HFpEF, and non-small cell lung cancer 03/2023 who presents today for 68-month follow-up.  Ms. Mcnease has a significant coronary history dating back to 1998 when she suffered an inferior MI and was treated with PCI to the RCA and subsequent PCI to left circumflex in 2012 as well as PCI's to the LAD and RCA in 2015 and 2017.  She underwent a EVAR procedure in 01/2016 for AAA.  She underwent a Myoview  on 07/2018 that was low risk.  She was seen in the clinic by  Dr. Arlester Ladd on 02/11/2019 with complaint of chest pain that required nitroglycerin  x 3.  She was seen in the ED on 03/2019 with complaint of chest pain via EMS with negative troponins.  She underwent LHC due to unstable angina that showed progressive disease in the distal RCA with no good targets for PCI.  She was started on Imdur  and weaned off nitroglycerin .  She was last seen by Dr. Arlester Ladd in office on 03/02/2023 and endorsed right-sided chest pain.  She reported pain occurred more often after eating specific foods that was relieved by belching.  She also noted relief with 1-2 nitroglycerin  and was advised to use Imdur  30 mg.  Recommendation was also made to pursue left heart cath if angina was refractory to  medication.  She presented to the ED on 03/21/2023 with complaint of chest pain on the lateral.  She had a CT of the chest completed that showed a 1 mm irregular pleural-based mass with necrotic filled fluid.  She was noted to have low blood pressure and elevated lactic acid and was treated with IV antibiotics and admitted to the ICU.  She also noted chest pain x 1 week described as 4 out of 10 tight stabbing in the center of the chest relieved with nitroglycerin  x 3.  EKG was benign with negative enzymes.  2D echo was completed showing EF of 50-55% with abnormal septal motion strain and trivial MVR.  Left heart cath was discussed however decision was made to cancel with low threshold for ischemic injury and thought to be related primarily to her musculoskeletal.  She had Imdur  15 mg added with Norvasc  discontinued due to soft BP.  She underwent a transthoracic needle biopsy by Dr. Baldwin Levee that showed squamous cell lung cancer.  She was referred to Dr. Randall Bush with further staging with PET and MRI of the brain that showed no evidence of metastasis she was admitted 04/22/2023 with complete of chest pain right anterior chest wall that was reproduced with palpation.  BP was in the 80s and patient was treated with fluid boluses and troponins were negative.  She was discharged with instructions to follow-up with PCP.  She is currently undergoing chemoradiation.  She recently had Port-A-Cath placed on 05/17/2023.   Patient denies chest pain, palpitations, dyspnea, PND, orthopnea, nausea, vomiting, dizziness, syncope, edema, weight gain, or early satiety.   Discussed the use of AI scribe software for clinical note transcription with the patient, who gave verbal consent to proceed.  History of Present Illness    ***Notes:   Review of Systems  Please see the history of present illness.    All other systems reviewed and are otherwise negative except as noted above.  Physical Exam     Wt Readings from  Last 3 Encounters:  05/22/23 170 lb 6.4 oz (77.3 kg)  05/15/23 170 lb 9.6 oz (77.4 kg)  05/09/23 167 lb (75.8 kg)   OZ:HYQMV were no vitals filed for this visit.,There is no height or weight on file to calculate BMI. GEN: Well nourished, well developed in no acute distress Neck: No JVD; No carotid bruits Pulmonary: Clear to auscultation without rales, wheezing or rhonchi  Cardiovascular: Normal rate. Regular rhythm. Normal S1. Normal S2.   Murmurs: There is no murmur.  ABDOMEN: Soft, non-tender, non-distended EXTREMITIES:  No edema; No deformity   EKG/LABS/ Recent Cardiac Studies   ECG personally reviewed by me today - ***  Risk Assessment/Calculations:   {Does this patient have ATRIAL FIBRILLATION?:[id number]}  Lab Results  Component Value Date   WBC 3.3 (L) 05/22/2023   HGB 10.6 (L) 05/22/2023   HCT 33.0 (L) 05/22/2023   MCV 84.4 05/22/2023   PLT 256 05/22/2023   Lab Results  Component Value Date   CREATININE 0.57 05/22/2023   BUN 5 (L) 05/22/2023   NA 139 05/22/2023   K 3.9 05/22/2023   CL 105 05/22/2023   CO2 30 05/22/2023   Lab Results  Component Value Date   CHOL 119 03/22/2023   HDL 22 (L) 03/22/2023   LDLCALC 85 03/22/2023   LDLDIRECT 131.0 03/27/2015   TRIG 62 03/22/2023   CHOLHDL 5.4 03/22/2023    Lab Results  Component Value Date   HGBA1C 5.5 02/21/2023   Assessment & Plan    Assessment and Plan Assessment & Plan     1.  History of CAD  2.  Chronic HFpEF  3.  Hyperlipidemia  4.  Essential hypertension  5.  History of non-small cell lung CA:      Disposition: Follow-up with Arnoldo Lapping, MD or APP in *** months {Are you ordering a CV Procedure (e.g. stress test, cath, DCCV, TEE, etc)?   Press F2        :[id number]}   Signed, Francene Ing, Retha Cast, NP 05/29/2023, 11:41 AM [facility]

## 2023-05-30 ENCOUNTER — Ambulatory Visit: Payer: Medicare HMO | Attending: Nurse Practitioner | Admitting: Nurse Practitioner

## 2023-05-30 ENCOUNTER — Other Ambulatory Visit (HOSPITAL_COMMUNITY): Payer: Self-pay

## 2023-05-30 ENCOUNTER — Encounter: Payer: Self-pay | Admitting: Internal Medicine

## 2023-05-30 ENCOUNTER — Ambulatory Visit

## 2023-05-30 ENCOUNTER — Encounter: Payer: Self-pay | Admitting: Nurse Practitioner

## 2023-05-30 VITALS — BP 94/58 | HR 74 | Ht 66.0 in | Wt 164.0 lb

## 2023-05-30 DIAGNOSIS — E782 Mixed hyperlipidemia: Secondary | ICD-10-CM | POA: Diagnosis not present

## 2023-05-30 DIAGNOSIS — I10 Essential (primary) hypertension: Secondary | ICD-10-CM | POA: Diagnosis not present

## 2023-05-30 DIAGNOSIS — C3411 Malignant neoplasm of upper lobe, right bronchus or lung: Secondary | ICD-10-CM

## 2023-05-30 DIAGNOSIS — I5032 Chronic diastolic (congestive) heart failure: Secondary | ICD-10-CM | POA: Diagnosis not present

## 2023-05-30 DIAGNOSIS — I25119 Atherosclerotic heart disease of native coronary artery with unspecified angina pectoris: Secondary | ICD-10-CM

## 2023-05-30 MED ORDER — OMRON 3 SERIES BP MONITOR DEVI
1.0000 | Freq: Every day | 0 refills | Status: DC
  Filled 2023-05-30: qty 1, 30d supply, fill #0

## 2023-05-30 MED ORDER — ATENOLOL 25 MG PO TABS
12.5000 mg | ORAL_TABLET | Freq: Every day | ORAL | 0 refills | Status: DC
Start: 2023-05-30 — End: 2023-10-17

## 2023-05-30 NOTE — Patient Instructions (Addendum)
 Medication Instructions:  DECREASE Atenolol  to 12.5mg  Take 1 tablet once a day PLEASE GO TO THE PHARMACY ON THE FIRST FLOOR AND GET YOUR BLOOD PRESSURE CUFF THEY COST ABOUT $35, BUT YOU MAY BE ABLE TO USE YOUR INSURANCE.  *If you need a refill on your cardiac medications before your next appointment, please call your pharmacy*  Lab Work: None ordered If you have labs (blood work) drawn today and your tests are completely normal, you will receive your results only by: MyChart Message (if you have MyChart) OR A paper copy in the mail If you have any lab test that is abnormal or we need to change your treatment, we will call you to review the results.  Testing/Procedures: None ordered  Follow-Up: At [facility], you and your health needs are our priority.  As part of our continuing mission to provide you with exceptional heart care, our providers are all part of one team.  This team includes your primary Cardiologist (physician) and Advanced Practice Providers or APPs (Physician Assistants and Nurse Practitioners) who all work together to provide you with the care you need, when you need it.  Your next appointment:   6 month(s)  Provider:   Charles Connor, NP  or Arnoldo Lapping, MD  We recommend signing up for the patient portal called "MyChart".  Sign up information is provided on this After Visit Summary.  MyChart is used to connect with patients for Virtual Visits (Telemedicine).  Patients are able to view lab/test results, encounter notes, upcoming appointments, etc.  Non-urgent messages can be sent to your provider as well.   To learn more about what you can do with MyChart, go to [email or web address].   Other Instructions

## 2023-05-30 NOTE — Radiation Completion Notes (Addendum)
  Radiation Oncology         [phone] ________________________________  Name: [name] MRN: [id number]  Date of Service: 05/29/2023  DOB: May 22, 1945  End of Treatment Note  Diagnosis: Stage IIIB (T4, N2b, M0) non-small cell lung cancer, squamous cell carcinoma  Intent: Curative     ==========DELIVERED PLANS==========  First Treatment Date: 2023-04-19 Last Treatment Date: 2023-05-29   Plan Name: Lung_R Site: Lung, Right Technique: 3D Mode: Photon Dose Per Fraction: 3 Gy Prescribed Dose (Delivered / Prescribed): 24 Gy / 30 Gy Prescribed Fxs (Delivered / Prescribed): 8 / 10   Plan Name: Lung_R_RxChng Site: Lung, Right Technique: 3D Mode: Photon Dose Per Fraction: 1.8 Gy Prescribed Dose (Delivered / Prescribed): 30.6 Gy / 30.6 Gy Prescribed Fxs (Delivered / Prescribed): 17 / 17     ====================================   The patient tolerated radiation. She developed fatigue during her treatment.   The patient will return in one month and will continue follow up with Dr. Marguerita Shih as well.      Amiel Kalata, PA-C

## 2023-05-31 ENCOUNTER — Inpatient Hospital Stay

## 2023-05-31 ENCOUNTER — Inpatient Hospital Stay (HOSPITAL_BASED_OUTPATIENT_CLINIC_OR_DEPARTMENT_OTHER): Admitting: Physician Assistant

## 2023-05-31 VITALS — BP 97/63 | HR 72 | Temp 97.7°F | Resp 14 | Wt 169.4 lb

## 2023-05-31 DIAGNOSIS — C3411 Malignant neoplasm of upper lobe, right bronchus or lung: Secondary | ICD-10-CM

## 2023-05-31 DIAGNOSIS — Z452 Encounter for adjustment and management of vascular access device: Secondary | ICD-10-CM | POA: Insufficient documentation

## 2023-05-31 DIAGNOSIS — Z5111 Encounter for antineoplastic chemotherapy: Secondary | ICD-10-CM | POA: Insufficient documentation

## 2023-05-31 DIAGNOSIS — K5900 Constipation, unspecified: Secondary | ICD-10-CM | POA: Insufficient documentation

## 2023-05-31 DIAGNOSIS — Z95828 Presence of other vascular implants and grafts: Secondary | ICD-10-CM

## 2023-05-31 LAB — CMP (CANCER CENTER ONLY)
ALT: 5 U/L (ref 0–44)
AST: 11 U/L — ABNORMAL LOW (ref 15–41)
Albumin: 3.2 g/dL — ABNORMAL LOW (ref 3.5–5.0)
Alkaline Phosphatase: 89 U/L (ref 38–126)
Anion gap: 5 (ref 5–15)
BUN: <5 mg/dL — ABNORMAL LOW (ref 8–23)
CO2: 29 mmol/L (ref 22–32)
Calcium: 7.7 mg/dL — ABNORMAL LOW (ref 8.9–10.3)
Chloride: 103 mmol/L (ref 98–111)
Creatinine: 0.61 mg/dL (ref 0.44–1.00)
GFR, Estimated: >60 mL/min (ref >60)
Glucose, Bld: 95 mg/dL (ref 70–99)
Potassium: 3.6 mmol/L (ref 3.5–5.1)
Sodium: 137 mmol/L (ref 135–145)
Total Bilirubin: 0.7 mg/dL (ref 0.0–1.2)
Total Protein: 7 g/dL (ref 6.5–8.1)

## 2023-05-31 LAB — CBC WITH DIFFERENTIAL (CANCER CENTER ONLY)
Abs Immature Granulocytes: 0.02 10*3/uL (ref 0.00–0.07)
Basophils Absolute: 0 10*3/uL (ref 0.0–0.1)
Basophils Relative: 1 %
Eosinophils Absolute: 0 10*3/uL (ref 0.0–0.5)
Eosinophils Relative: 1 %
HCT: 30.9 % — ABNORMAL LOW (ref 36.0–46.0)
Hemoglobin: 10.1 g/dL — ABNORMAL LOW (ref 12.0–15.0)
Immature Granulocytes: 1 %
Lymphocytes Relative: 13 %
Lymphs Abs: 0.5 10*3/uL — ABNORMAL LOW (ref 0.7–4.0)
MCH: 28 pg (ref 26.0–34.0)
MCHC: 32.7 g/dL (ref 30.0–36.0)
MCV: 85.6 fL (ref 80.0–100.0)
Monocytes Absolute: 0.4 10*3/uL (ref 0.1–1.0)
Monocytes Relative: 13 %
Neutro Abs: 2.5 10*3/uL (ref 1.7–7.7)
Neutrophils Relative %: 71 %
Platelet Count: 218 10*3/uL (ref 150–400)
RBC: 3.61 MIL/uL — ABNORMAL LOW (ref 3.87–5.11)
RDW: 18.6 % — ABNORMAL HIGH (ref 11.5–15.5)
WBC Count: 3.5 10*3/uL — ABNORMAL LOW (ref 4.0–10.5)
nRBC: 0 % (ref 0.0–0.2)

## 2023-05-31 MED ORDER — SODIUM CHLORIDE 0.9% FLUSH
10.0000 mL | Freq: Once | INTRAVENOUS | Status: AC
Start: 2023-05-31 — End: 2023-05-31
  Administered 2023-05-31: 10 mL

## 2023-06-01 ENCOUNTER — Telehealth: Payer: Self-pay

## 2023-06-01 NOTE — Telephone Encounter (Signed)
 FYI,Looks like patient comes in about every 4 months to get Zilretta . She usually calls to schedule when she is ready.  Zilretta  authorized for bilateral knee No medical notes or referrals needed  Copay $45 Deductible does not apply OOP MAX $9350 has met $ 4805.51 PA approved Auth number [id number] 11/04/22-01/17/2024

## 2023-06-02 NOTE — Telephone Encounter (Signed)
 Last given 2/4.

## 2023-06-05 ENCOUNTER — Ambulatory Visit: Payer: Self-pay | Admitting: Radiation Oncology

## 2023-06-08 ENCOUNTER — Other Ambulatory Visit (HOSPITAL_COMMUNITY): Payer: Self-pay

## 2023-06-13 ENCOUNTER — Encounter: Payer: Self-pay | Admitting: Family Medicine

## 2023-06-13 ENCOUNTER — Other Ambulatory Visit: Payer: Self-pay | Admitting: Interventional Radiology

## 2023-06-13 ENCOUNTER — Ambulatory Visit (INDEPENDENT_AMBULATORY_CARE_PROVIDER_SITE_OTHER): Admitting: Family Medicine

## 2023-06-13 ENCOUNTER — Other Ambulatory Visit: Payer: Self-pay

## 2023-06-13 VITALS — BP 104/70 | HR 81 | Ht 66.0 in

## 2023-06-13 DIAGNOSIS — M170 Bilateral primary osteoarthritis of knee: Secondary | ICD-10-CM

## 2023-06-13 DIAGNOSIS — M25561 Pain in right knee: Secondary | ICD-10-CM | POA: Diagnosis not present

## 2023-06-13 DIAGNOSIS — G8929 Other chronic pain: Secondary | ICD-10-CM

## 2023-06-13 DIAGNOSIS — M25562 Pain in left knee: Secondary | ICD-10-CM

## 2023-06-13 MED ORDER — TRIAMCINOLONE ACETONIDE 32 MG IX SRER
32.0000 mg | Freq: Once | INTRA_ARTICULAR | Status: AC
  Administered 2023-06-13: 32 mg via INTRA_ARTICULAR

## 2023-06-13 NOTE — Progress Notes (Signed)
 I, Miquel Amen, CMA acting as a scribe for Garlan Juniper, MD.  [name] is a 78 y.o. female who presents to [facility] today for exacerbation of her bilat knee pain. Pt was last seen by Dr. Alease Hunter on 02/21/23 and was given repeat bilateral Zilretta  injections.   Today, pt reports exacerbation of bilat knee pain over the past 2-3 weeks. Taking Aleve  prn. Sx have responded well to Zilretta  injections in the past. Sx impacting gait and mobility. Feels like pain flares with cold and/or rainy weather. Ambulating with a cane today.   Dx imaging: 11/21/19 R & L knee XR  Pertinent review of systems: No fevers or chills  Relevant historical information: History of lung cancer.  Peripheral vascular disease.   Exam:  BP 104/70   Pulse 81   Ht 5\' 6"  (1.676 m)   SpO2 100%   BMI 27.34 kg/m  General: Well Developed, well nourished, and in no acute distress.   MSK: Knees bilaterally large effusion.  Decreased motion.    Lab and Radiology Results   Zilretta  injection right knee Procedure: Real-time Ultrasound Guided Injection of right knee joint superior lateral patellar space Device: Philips Affiniti 50G Images permanently stored and available for review in PACS Verbal informed consent obtained.  Discussed risks and benefits of procedure. Warned about infection, hyperglycemia bleeding, damage to structures among others. Patient expresses understanding and agreement Time-out conducted.   Noted no overlying erythema, induration, or other signs of local infection.   Skin prepped in a sterile fashion.   Local anesthesia: Topical Ethyl chloride.   With sterile technique and under real time ultrasound guidance: Zilretta  32 mg injected into knee joint. Fluid seen entering the joint capsule.   Completed without difficulty   Advised to call if fevers/chills, erythema, induration, drainage, or persistent bleeding.   Images permanently stored and available for  review in the ultrasound unit.  Impression: Technically successful ultrasound guided injection.  Procedure: Real-time Ultrasound Guided Injection of left knee joint superior lateral patellar space Device: Philips Affiniti 50G Images permanently stored and available for review in PACS Verbal informed consent obtained.  Discussed risks and benefits of procedure. Warned about infection, hyperglycemia bleeding, damage to structures among others. Patient expresses understanding and agreement Time-out conducted.   Noted no overlying erythema, induration, or other signs of local infection.   Skin prepped in a sterile fashion.   Local anesthesia: Topical Ethyl chloride.   With sterile technique and under real time ultrasound guidance: Zilretta  32 mg injected into knee joint. Fluid seen entering the joint capsule.   Completed without difficulty   Advised to call if fevers/chills, erythema, induration, drainage, or persistent bleeding.   Images permanently stored and available for review in the ultrasound unit.  Impression: Technically successful ultrasound guided injection.  Lot number: [id number] for both injections     Assessment and Plan: 78 y.o. female with bilateral knee pain due to DJD.  Plan for repeat Zilretta  injection today.  Also will refer to interventional radiology to consider genicular artery embolization.  Anticipate repeat injection in about 3 months.   PDMP not reviewed this encounter. Orders Placed This Encounter  Procedures   US  LIMITED JOINT SPACE STRUCTURES LOW BILAT(NO LINKED CHARGES)    Reason for Exam (SYMPTOM  OR DIAGNOSIS REQUIRED):   bilat knee pain    Preferred imaging location?:   [facility]   IR Radiologist Eval & Mgmt    Standing Status:  Future    Expiration Date:   06/12/2024    Reason for Exam (SYMPTOM  OR DIAGNOSIS REQUIRED):   bilat knee pain / OA    Preferred Imaging Location?:   [facility]   Meds ordered this encounter   Medications   Triamcinolone  Acetonide (ZILRETTA ) intra-articular injection 32 mg   Triamcinolone  Acetonide (ZILRETTA ) intra-articular injection 32 mg     Discussed warning signs or symptoms. Please see discharge instructions. Patient expresses understanding.   The above documentation has been reviewed and is accurate and complete Garlan Juniper, M.D.

## 2023-06-13 NOTE — Patient Instructions (Addendum)
 Thank you for coming in today.   Zilretta  injection today.  You received an injection today. Seek immediate medical attention if the joint becomes red, extremely painful, or is oozing fluid.   Referral placed for consult for Genicular Artery Embolization.   See you back as needed

## 2023-06-15 ENCOUNTER — Inpatient Hospital Stay

## 2023-06-15 ENCOUNTER — Ambulatory Visit (HOSPITAL_COMMUNITY)
Admission: RE | Admit: 2023-06-15 | Discharge: 2023-06-15 | Disposition: A | Discharge status: Home / Self Care | Source: Ambulatory Visit | Attending: Physician Assistant | Admitting: Physician Assistant

## 2023-06-15 DIAGNOSIS — Z95828 Presence of other vascular implants and grafts: Secondary | ICD-10-CM

## 2023-06-15 DIAGNOSIS — C3411 Malignant neoplasm of upper lobe, right bronchus or lung: Secondary | ICD-10-CM

## 2023-06-15 DIAGNOSIS — Z452 Encounter for adjustment and management of vascular access device: Secondary | ICD-10-CM | POA: Diagnosis not present

## 2023-06-15 DIAGNOSIS — K5900 Constipation, unspecified: Secondary | ICD-10-CM | POA: Diagnosis not present

## 2023-06-15 DIAGNOSIS — J929 Pleural plaque without asbestos: Secondary | ICD-10-CM | POA: Diagnosis not present

## 2023-06-15 DIAGNOSIS — Z5111 Encounter for antineoplastic chemotherapy: Secondary | ICD-10-CM | POA: Diagnosis not present

## 2023-06-15 LAB — CMP (CANCER CENTER ONLY)
ALT: <5 U/L (ref 0–44)
AST: 9 U/L — ABNORMAL LOW (ref 15–41)
Albumin: 3.3 g/dL — ABNORMAL LOW (ref 3.5–5.0)
Alkaline Phosphatase: 100 U/L (ref 38–126)
Anion gap: 6 (ref 5–15)
BUN: 6 mg/dL — ABNORMAL LOW (ref 8–23)
CO2: 31 mmol/L (ref 22–32)
Calcium: 8.5 mg/dL — ABNORMAL LOW (ref 8.9–10.3)
Chloride: 106 mmol/L (ref 98–111)
Creatinine: 0.56 mg/dL (ref 0.44–1.00)
GFR, Estimated: >60 mL/min (ref >60)
Glucose, Bld: 119 mg/dL — ABNORMAL HIGH (ref 70–99)
Potassium: 3.6 mmol/L (ref 3.5–5.1)
Sodium: 143 mmol/L (ref 135–145)
Total Bilirubin: 0.5 mg/dL (ref 0.0–1.2)
Total Protein: 7.4 g/dL (ref 6.5–8.1)

## 2023-06-15 LAB — CBC WITH DIFFERENTIAL (CANCER CENTER ONLY)
Abs Immature Granulocytes: 0.02 10*3/uL (ref 0.00–0.07)
Basophils Absolute: 0 10*3/uL (ref 0.0–0.1)
Basophils Relative: 0 %
Eosinophils Absolute: 0 10*3/uL (ref 0.0–0.5)
Eosinophils Relative: 0 %
HCT: 33.9 % — ABNORMAL LOW (ref 36.0–46.0)
Hemoglobin: 10.9 g/dL — ABNORMAL LOW (ref 12.0–15.0)
Immature Granulocytes: 0 %
Lymphocytes Relative: 11 %
Lymphs Abs: 0.6 10*3/uL — ABNORMAL LOW (ref 0.7–4.0)
MCH: 28.1 pg (ref 26.0–34.0)
MCHC: 32.2 g/dL (ref 30.0–36.0)
MCV: 87.4 fL (ref 80.0–100.0)
Monocytes Absolute: 0.3 10*3/uL (ref 0.1–1.0)
Monocytes Relative: 6 %
Neutro Abs: 4.5 10*3/uL (ref 1.7–7.7)
Neutrophils Relative %: 83 %
Platelet Count: 245 10*3/uL (ref 150–400)
RBC: 3.88 MIL/uL (ref 3.87–5.11)
RDW: 21.1 % — ABNORMAL HIGH (ref 11.5–15.5)
WBC Count: 5.4 10*3/uL (ref 4.0–10.5)
nRBC: 0 % (ref 0.0–0.2)

## 2023-06-15 MED ORDER — IOHEXOL 300 MG/ML  SOLN
75.0000 mL | Freq: Once | INTRAMUSCULAR | Status: AC | PRN
  Administered 2023-06-15: 75 mL via INTRAVENOUS

## 2023-06-15 MED ORDER — HEPARIN SOD (PORK) LOCK FLUSH 100 UNIT/ML IV SOLN
INTRAVENOUS | Status: AC
Start: 2023-06-15 — End: ?
  Filled 2023-06-15: qty 5

## 2023-06-15 MED ORDER — SODIUM CHLORIDE 0.9% FLUSH
10.0000 mL | Freq: Once | INTRAVENOUS | Status: AC
  Administered 2023-06-15: 10 mL

## 2023-06-15 MED ORDER — SODIUM CHLORIDE (PF) 0.9 % IJ SOLN
INTRAMUSCULAR | Status: AC
  Filled 2023-06-15: qty 50

## 2023-06-15 MED ORDER — HEPARIN SOD (PORK) LOCK FLUSH 100 UNIT/ML IV SOLN
500.0000 [IU] | Freq: Once | INTRAVENOUS | Status: AC
  Administered 2023-06-15: 500 [IU] via INTRAVENOUS

## 2023-06-19 NOTE — Progress Notes (Signed)
 Chief Complaint: Patient was seen in consultation today for bilateral knee pain.   Referring Physician(s): Corey,Evan S  History of Present Illness: [name] is a 78 y.o. female with a medical history significant for CAD, obesity, HTN, DM2, anxiety, lower GI bleed, AAA, PVD and recently diagnosed non-small cell lung cancer. She is familiar to IR from a lung mass biopsy 03/27/23 and port-a-catheter placement 05/17/23.    The patient also has a history of bilateral knee pain and she has been followed by Sports Medicine for several years. She has received numerous Zilretta  injections and has previously received steroid injections. Knee injections provide only short term relief and her pain is rated as severe when the effects wear off. She is not a surgical candidate and Dr. Alease Hunter has kindly referred her to Interventional Radiology to discuss geniculate artery embolization. She ambulates primarily with a cane.  Her right knee bothers her  more than her right, but only slightly.    WOMAC = 63/96 VAS = 7/10  Past Medical History:  Diagnosis Date   AAA (abdominal aortic aneurysm) (HCC)    a. s/p stent graft repair 01/2016.   Acute ischemic colitis (HCC) 07/29/2010   Diagnosed June, 2012 characterized by acute lower GI bleeding, spontaneously resolved.    Acute respiratory infection 06/09/2014   Acute sinus infection 05/14/2015   Allergic rhinitis, cause unspecified    Angioedema of lips 06/03/2014   Anxiety state, unspecified    Arthritis of left hip 04/16/2015   Bilateral hearing loss 07/19/2017   Chronic LBP    Coronary artery disease    a. inferior MI 1998 s/p PCI of RCA. b. stenting of Cx 04/2010. c. DES to prox LAD 05/2013. d. DES x 3 in 10/2015. // Myoview  07/2018:  EF 59, normal perfusion, low risk    Coronary artery disease involving native heart without angina pectoris 07/31/2006   Qualifier: Diagnosis of  By: Edmonia Gottron MD, Beauford Bounds  ANGIOGRAPHIC DATA:  1. Ventriculography done in the  RAO projection reveals a small wall  motion abnormality in the mid inferior wall. Ejection fraction  would be estimated at around 50%.  2. The right coronary artery has some progressive disease of about 60-  80% in the proximal mid segment. The distal vessel appears to be  50-70% narrowing althou   Degeneration of lumbar or lumbosacral intervertebral disc    Depressive disorder, not elsewhere classified    Diabetes (HCC) 11/03/2006   Qualifier: Diagnosis of  By: Autry Legions MD, Alveda Aures    Diabetes mellitus    TYPE II   Frequent urination 05/08/2014   Gout 08/22/2013   History of endovascular stent graft for abdominal aortic aneurysm (AAA) 01/22/2016   Hyperlipidemia    Hypertension    Hypothyroidism    Iron  deficiency anemia 06/13/2017   Lower GI bleed 06/2010   Diverticular bleed   Lumbar radiculopathy 05/15/2015   MENOPAUSAL DISORDER 10/29/2007   Qualifier: Diagnosis of  By: Autry Legions MD, Alveda Aures    Myalgia 09/25/2013   Noncompliance with medications 02/26/2014   Obesity, unspecified    OTITIS MEDIA, SEROUS, CHRONIC 04/23/2007   Qualifier: Diagnosis of  By: Edmonia Gottron MD, Beauford Bounds    Thoracic disc disease 11/19/2018   URTICARIA 09/23/2009   Qualifier: Diagnosis of  By: Myrtice Athens MD, Tarri Farm A    Venous insufficiency 07/19/2017    Past Surgical History:  Procedure Laterality Date   CARDIAC CATHETERIZATION     PCI OF BOTH THE CIRCUMFLEX AND LEFT  ANTERIOR DESCENDING ARTERY   CARDIAC CATHETERIZATION N/A 10/29/2015   Procedure: Coronary Stent Intervention;  Surgeon: Sammy Crisp, MD;  Location: [facility] INVASIVE CV LAB;  Service: Cardiovascular;  Laterality: N/A;   CARDIAC CATHETERIZATION N/A 10/29/2015   Procedure: Coronary/Graft Angiography;  Surgeon: Sammy Crisp, MD;  Location: [facility] INVASIVE CV LAB;  Service: Cardiovascular;  Laterality: N/A;   CARDIAC CATHETERIZATION N/A 10/29/2015   Procedure: Intravascular Pressure Wire/FFR Study;  Surgeon: Sammy Crisp, MD;  Location[facility] INVASIVE CV LAB;  Service:  Cardiovascular;  Laterality: N/A;   CESAREAN SECTION     ENDOVASCULAR STENT INSERTION N/A 01/22/2016   Procedure: ABDOMINAL AORTIC ENDOVASCULAR STENT GRAFT INSERTION;  Surgeon: Mayo Speck, MD;  Location: [facility] OR;  Service: Vascular;  Laterality: N/A;   HEART STENT  04-2010  and  Jun 07, 2013   X 3   IR IMAGING GUIDED PORT INSERTION  05/17/2023   LEFT HEART CATH AND CORONARY ANGIOGRAPHY N/A 04/03/2019   Procedure: LEFT HEART CATH AND CORONARY ANGIOGRAPHY;  Surgeon: Sammy Crisp, MD;  Location: [facility] INVASIVE CV LAB;  Service: Cardiovascular;  Laterality: N/A;   LEFT HEART CATHETERIZATION WITH CORONARY ANGIOGRAM N/A 06/07/2013   Procedure: LEFT HEART CATHETERIZATION WITH CORONARY ANGIOGRAM;  Surgeon: Odie Benne, MD;  Location[facility] CATH LAB;  Service: Cardiovascular;  Laterality: N/A;   LEFT HEART CATHETERIZATION WITH CORONARY ANGIOGRAM N/A 02/25/2014   Procedure: LEFT HEART CATHETERIZATION WITH CORONARY ANGIOGRAM;  Surgeon: Millicent Ally, MD;  Location: [facility] CATH LAB;  Service: Cardiovascular;  Laterality: N/A;   LUMBAR FUSION  01/2007   DR. Faylene Hoots...3-LEVEL WITH FIXATION   OOPHORECTOMY     BSO? pt.unsure   PARATHYROIDECTOMY     SPINE SURGERY     THYROIDECTOMY     TOTAL ABDOMINAL HYSTERECTOMY      Allergies: Crestor  [rosuvastatin  calcium ], Prilosec [omeprazole ], Miconazole nitrate, Augmentin  [amoxicillin -pot clavulanate], and Doxycycline   Medications: Prior to Admission medications   Medication Sig Start Date End Date Taking? Authorizing Provider  aspirin  81 MG EC tablet Take 81 mg by mouth daily.     [provider]  atenolol  (TENORMIN ) 25 MG tablet Take 0.5 tablets (12.5 mg total) by mouth daily. 05/30/23   Gerald Kitty., NP  atorvastatin  (LIPITOR ) 40 MG tablet Take 1 tablet (40 mg total) by mouth daily. 10/07/22 05/30/23  Arnoldo Lapping, MD  Blood Pressure Monitoring (OMRON 3 SERIES BP MONITOR) DEVI Use as directed. 05/30/23   Gerald Kitty., NP  fluconazole   (DIFLUCAN ) 150 MG tablet Take one tab by mouth every 3 days as needed 03/30/23   Roslyn Coombe, MD  Foot Care Products ([employer] GEL HEEL CUSHION WOMENS) PADS Use as directed at bedtime 03/30/23   Roslyn Coombe, MD  gabapentin  (NEURONTIN ) 300 MG capsule Take 1-2 tab by mouth at bedtime for pain and sleep Patient taking differently: Take 300 mg by mouth as needed. Take 1-2 tab by mouth at bedtime for pain and sleep 04/22/21   Roslyn Coombe, MD  HYDROcodone -acetaminophen  (NORCO) 10-325 MG tablet Take 1 tablet by mouth every 8 (eight) hours as needed. 04/25/23   Retta Caster, MD  isosorbide  mononitrate (IMDUR ) 30 MG 24 hr tablet Take 0.5 tablets (15 mg total) by mouth daily. 03/28/23 05/30/23  Lesa Rape, MD  levofloxacin  (LEVAQUIN ) 500 MG tablet Take 1 tablet (500 mg total) by mouth daily. 04/10/23   Roslyn Coombe, MD  levothyroxine  (SYNTHROID ) 100 MCG tablet Take 1 tablet (100 mcg total) by mouth daily before breakfast.  03/28/23 05/30/23  Lesa Rape, MD  lidocaine -prilocaine  (EMLA ) cream Apply 1 Application topically as needed. 04/27/23   Heilingoetter, Cassandra L, PA-C  phenazopyridine  (PYRIDIUM ) 200 MG tablet Take 1 tablet (200 mg total) by mouth 3 (three) times daily as needed for pain (with urination). 05/01/23   Retta Caster, MD  prochlorperazine  (COMPAZINE ) 10 MG tablet Take 1 tablet (10 mg total) by mouth every 6 (six) hours as needed. 04/27/23   Heilingoetter, Cassandra L, PA-C  temazepam  (RESTORIL ) 15 MG capsule Take 1 capsule (15 mg total) by mouth at bedtime as needed for sleep. 04/10/23   Roslyn Coombe, MD  Triamcinolone  Acetonide (ZILRETTA ) 32 MG SRER intra-articular injection 5 mL, intra-articular, bilat knees 01/27/23   Syliva Even, MD     Family History  Problem Relation Age of Onset   Heart attack Mother 44       s/p D&C-CARDIAC ARREST 1966   Heart disease Mother    Heart attack Father 74       23 WITH MI   Heart disease Father    Diabetes Brother    Diabetes Maternal Aunt    Arthritis  Maternal Aunt    Breast cancer Maternal Aunt        Post menopausal   Colon cancer Neg Hx     Social History   Socioeconomic History   Marital status: Divorced    Spouse name: Not on file   Number of children: 3   Years of education: 13   Highest education level: Not on file  Occupational History   Occupation: RETIRED    Employer: [employer]    Comment: ADMIN [employer]    Employer: RETIRED  Tobacco Use   Smoking status: Former    Current packs/day: 0.00    Types: Cigarettes    Start date: 05/30/1956    Quit date: 05/31/1986    Years since quitting: 37.0   Smokeless tobacco: Never  Vaping Use   Vaping status: Never Used  Substance and Sexual Activity   Alcohol use: No   Drug use: No   Sexual activity: Not Currently    Comment: 1st intercourse 78 yo-Fewer than 5 partners  Other Topics Concern   Not on file  Social History Narrative   DIVORCED   3 CHILDREN   PATIENT SIGNED A DESIGNATED PARTY RELEASE TO ALLOW HER DAUGHTER, TRAMAINE Schaafsma, TO HAVE ACCESS TO HER MEDICAL RECORDS/INFORMATION. Deette Fass, May 04, 2009 @ 3:27 PM   Right handed   One story home      Lives alone.2025   .   Social Drivers of Health   Financial Resource Strain: Medium Risk (04/11/2023)   Overall Financial Resource Strain (CARDIA)    Difficulty of Paying Living Expenses: Somewhat hard  Food Insecurity: No Food Insecurity (04/13/2023)   Hunger Vital Sign    Worried About Running Out of Food in the Last Year: Never true    Ran Out of Food in the Last Year: Never true  Transportation Needs: No Transportation Needs (04/13/2023)   PRAPARE - Administrator, Civil Service (Medical): No    Lack of Transportation (Non-Medical): No  Physical Activity: Insufficiently Active (04/11/2023)   Exercise Vital Sign    Days of Exercise per Week: 2 days    Minutes of Exercise per Session: 10 min  Stress: No Stress Concern Present (04/11/2023)   [employer] of Occupational Health -  Occupational Stress Questionnaire    Feeling of Stress :  Not at all  Social Connections: Moderately Integrated (04/11/2023)   Social Connection and Isolation Panel [NHANES]    Frequency of Communication with Friends and Family: More than three times a week    Frequency of Social Gatherings with Friends and Family: Twice a week    Attends Religious Services: More than 4 times per year    Active Member of [employer] or Organizations: Yes    Attends Banker Meetings: Never    Marital Status: Divorced  Recent Concern: Social Connections - Socially Isolated (03/22/2023)   Social Connection and Isolation Panel [NHANES]    Frequency of Communication with Friends and Family: Twice a week    Frequency of Social Gatherings with Friends and Family: Once a week    Attends Religious Services: Never    Database administrator or Organizations: No    Attends Engineer, structural: Never    Marital Status: Divorced     Review of Systems: A 12 point ROS discussed and pertinent positives are indicated in the HPI above.  All other systems are negative.  Vital Signs: There were no vitals taken for this visit.  Advance Care Plan: The advanced care plan/surrogate decision maker was discussed at the time of visit and documented in the medical record.    Physical Exam Constitutional:      General: She is not in acute distress. HENT:     Head: Normocephalic.     Mouth/Throat:     Mouth: Mucous membranes are moist.  Eyes:     General: No scleral icterus. Cardiovascular:     Rate and Rhythm: Normal rate and regular rhythm.  Pulmonary:     Effort: No respiratory distress.  Abdominal:     General: There is no distension.  Musculoskeletal:     Right lower leg: No edema.     Left lower leg: No edema.       Legs:     Comments: Tender to palpation diffusely.  Skin:    General: Skin is warm and dry.  Neurological:     Mental Status: She is alert and oriented to person, place, and time.      Imaging:  DG Knee        06/21/23   Kellgren and Lawrence grade IV bilaterally   Labs:  CBC: Recent Labs    05/15/23 0831 05/22/23 1345 05/31/23 0959 06/15/23 1059  WBC 4.9 3.3* 3.5* 5.4  HGB 11.4* 10.6* 10.1* 10.9*  HCT 35.8* 33.0* 30.9* 33.9*  PLT 302 256 218 245    COAGS: Recent Labs    03/26/23 0804 03/27/23 0741  INR 1.1 1.2    BMP: Recent Labs    05/15/23 0831 05/22/23 1345 05/31/23 0959 06/15/23 1059  NA 141 139 137 143  K 3.9 3.9 3.6 3.6  CL 106 105 103 106  CO2 31 30 29 31   GLUCOSE 85 77 95 119*  BUN 5* 5* <5* 6*  CALCIUM  8.1* 8.2* 7.7* 8.5*  CREATININE 0.65 0.57 0.61 0.56  GFRNONAA >60 >60 >60 >60    LIVER FUNCTION TESTS: Recent Labs    05/15/23 0831 05/22/23 1345 05/31/23 0959 06/15/23 1059  BILITOT 0.3 0.8 0.7 0.5  AST 12* 11* 11* 9*  ALT 6 6 5  <5  ALKPHOS 82 85 89 100  PROT 7.1 7.1 7.0 7.4  ALBUMIN 3.0* 3.1* 3.2* 3.3*    TUMOR MARKERS: No results for input(s): "AFPTM", "CEA", "CA199", "CHROMGRNA" in the last 8760 hours.  Assessment and  Plan: 78 year old female with a history of advanced bilateral knee osteoarthritis (K&G IV) with associated severe pain and lifestyle limitation (WOMAC 63/96).  We discussed the rationale, periprocedural expectations, and long term expected outcomes of geniculate artery embolization.  She would like to proceed.  Plan for right geniculate artery embolization with moderate sedation via antegrade femoral approach at [facility].  If she responds well at 1 month post procedure, we will then consider left GAE.    Creasie Doctor, MD Pager: [phone]    I spent a total of  40 Minutes   in face to face in clinical consultation, greater than 50% of which was counseling/coordinating care for bilateral knee pain.

## 2023-06-21 ENCOUNTER — Ambulatory Visit
Admission: RE | Admit: 2023-06-21 | Discharge: 2023-06-21 | Disposition: A | Discharge status: Home / Self Care | Source: Ambulatory Visit | Attending: Interventional Radiology | Admitting: Interventional Radiology

## 2023-06-21 ENCOUNTER — Telehealth: Payer: Self-pay | Admitting: Radiation Oncology

## 2023-06-21 ENCOUNTER — Ambulatory Visit
Admission: RE | Admit: 2023-06-21 | Discharge: 2023-06-21 | Disposition: A | Discharge status: Home / Self Care | Source: Ambulatory Visit | Attending: Family Medicine | Admitting: Family Medicine

## 2023-06-21 DIAGNOSIS — G8929 Other chronic pain: Secondary | ICD-10-CM

## 2023-06-21 DIAGNOSIS — M170 Bilateral primary osteoarthritis of knee: Secondary | ICD-10-CM

## 2023-06-21 HISTORY — PX: IR RADIOLOGIST EVAL & MGMT: IMG5224

## 2023-06-21 NOTE — Telephone Encounter (Signed)
 6/4 Patient and patient's daughter arrived in [facility] for her appt.  No appts was scheduled with [facility] on today, but for IR at [facility].  Patient is aware and heading over there.  Left voicemail with IR at [facility], so they are aware.

## 2023-06-22 ENCOUNTER — Encounter: Payer: Self-pay | Admitting: Radiation Oncology

## 2023-06-23 ENCOUNTER — Encounter: Payer: Self-pay | Admitting: Internal Medicine

## 2023-06-23 ENCOUNTER — Ambulatory Visit (INDEPENDENT_AMBULATORY_CARE_PROVIDER_SITE_OTHER)
Admission: RE | Admit: 2023-06-23 | Discharge: 2023-06-23 | Disposition: A | Discharge status: Home / Self Care | Source: Ambulatory Visit | Attending: Internal Medicine | Admitting: Internal Medicine

## 2023-06-23 DIAGNOSIS — Z780 Asymptomatic menopausal state: Secondary | ICD-10-CM

## 2023-06-23 DIAGNOSIS — M8580 Other specified disorders of bone density and structure, unspecified site: Secondary | ICD-10-CM

## 2023-06-26 ENCOUNTER — Inpatient Hospital Stay (HOSPITAL_BASED_OUTPATIENT_CLINIC_OR_DEPARTMENT_OTHER): Admitting: Nurse Practitioner

## 2023-06-26 ENCOUNTER — Encounter: Payer: Self-pay | Admitting: Radiation Oncology

## 2023-06-26 ENCOUNTER — Inpatient Hospital Stay: Attending: Internal Medicine | Admitting: Internal Medicine

## 2023-06-26 ENCOUNTER — Ambulatory Visit
Admission: RE | Admit: 2023-06-26 | Discharge: 2023-06-26 | Disposition: A | Discharge status: Home / Self Care | Source: Ambulatory Visit | Attending: Radiation Oncology | Admitting: Radiation Oncology

## 2023-06-26 ENCOUNTER — Inpatient Hospital Stay: Admitting: Licensed Clinical Social Worker

## 2023-06-26 ENCOUNTER — Encounter: Payer: Self-pay | Admitting: Nurse Practitioner

## 2023-06-26 VITALS — BP 100/60 | HR 74 | Temp 97.6°F | Resp 18 | Ht 66.0 in | Wt 165.5 lb

## 2023-06-26 VITALS — BP 106/60 | HR 74 | Temp 97.6°F | Resp 18 | Ht 66.0 in | Wt 165.0 lb

## 2023-06-26 DIAGNOSIS — R5383 Other fatigue: Secondary | ICD-10-CM | POA: Insufficient documentation

## 2023-06-26 DIAGNOSIS — M25461 Effusion, right knee: Secondary | ICD-10-CM | POA: Insufficient documentation

## 2023-06-26 DIAGNOSIS — C3411 Malignant neoplasm of upper lobe, right bronchus or lung: Secondary | ICD-10-CM

## 2023-06-26 DIAGNOSIS — I7090 Unspecified atherosclerosis: Secondary | ICD-10-CM | POA: Insufficient documentation

## 2023-06-26 DIAGNOSIS — Z79899 Other long term (current) drug therapy: Secondary | ICD-10-CM | POA: Diagnosis not present

## 2023-06-26 DIAGNOSIS — Z923 Personal history of irradiation: Secondary | ICD-10-CM | POA: Insufficient documentation

## 2023-06-26 DIAGNOSIS — Z79890 Hormone replacement therapy (postmenopausal): Secondary | ICD-10-CM | POA: Diagnosis not present

## 2023-06-26 DIAGNOSIS — M1712 Unilateral primary osteoarthritis, left knee: Secondary | ICD-10-CM | POA: Diagnosis not present

## 2023-06-26 DIAGNOSIS — G479 Sleep disorder, unspecified: Secondary | ICD-10-CM | POA: Insufficient documentation

## 2023-06-26 DIAGNOSIS — Z9221 Personal history of antineoplastic chemotherapy: Secondary | ICD-10-CM | POA: Diagnosis not present

## 2023-06-26 DIAGNOSIS — Z79620 Long term (current) use of immunosuppressive biologic: Secondary | ICD-10-CM | POA: Insufficient documentation

## 2023-06-26 DIAGNOSIS — Z515 Encounter for palliative care: Secondary | ICD-10-CM

## 2023-06-26 DIAGNOSIS — G893 Neoplasm related pain (acute) (chronic): Secondary | ICD-10-CM | POA: Diagnosis not present

## 2023-06-26 DIAGNOSIS — R0781 Pleurodynia: Secondary | ICD-10-CM | POA: Diagnosis not present

## 2023-06-26 DIAGNOSIS — K5900 Constipation, unspecified: Secondary | ICD-10-CM | POA: Insufficient documentation

## 2023-06-26 DIAGNOSIS — Z7982 Long term (current) use of aspirin: Secondary | ICD-10-CM | POA: Insufficient documentation

## 2023-06-26 DIAGNOSIS — K5903 Drug induced constipation: Secondary | ICD-10-CM | POA: Diagnosis not present

## 2023-06-26 DIAGNOSIS — M25462 Effusion, left knee: Secondary | ICD-10-CM | POA: Diagnosis not present

## 2023-06-26 DIAGNOSIS — Z5112 Encounter for antineoplastic immunotherapy: Secondary | ICD-10-CM | POA: Diagnosis not present

## 2023-06-26 DIAGNOSIS — R530 Neoplastic (malignant) related fatigue: Secondary | ICD-10-CM | POA: Diagnosis not present

## 2023-06-26 HISTORY — DX: Personal history of irradiation: Z92.3

## 2023-06-26 MED ORDER — OXYCODONE HCL 5 MG PO TABS: 5.0000 mg | ORAL_TABLET | ORAL | 0 refills | Status: DC | PRN

## 2023-06-26 NOTE — Progress Notes (Signed)
 DISCONTINUE ON PATHWAY REGIMEN - Non-Small Cell Lung     A cycle is every 7 days, concurrent with RT:     Paclitaxel       Carboplatin    **Always confirm dose/schedule in your pharmacy ordering system**  REASON: Continuation Of Treatment PRIOR TREATMENT: [id number]: Carboplatin  AUC=2 + Paclitaxel  45 mg/m2 Weekly During Radiation TREATMENT RESPONSE: Partial Response (PR)  START ON PATHWAY REGIMEN - Non-Small Cell Lung     A cycle is every 28 days:     Durvalumab   **Always confirm dose/schedule in your pharmacy ordering system**  Patient Characteristics: Preoperative or Nonsurgical Candidate (Clinical Staging), Stage IIB (N2a only) or Stage III - Nonsurgical Candidate, PS = 0,1 Therapeutic Status: Preoperative or Nonsurgical Candidate (Clinical Staging) AJCC T Category: cT4 AJCC N Category: cN2b AJCC M Category: cM0 AJCC 9 Stage Grouping: IIIB Check here if patient was staged using an edition other than AJCC Staging 9th Edition: true ECOG Performance Status: 1 Intent of Therapy: Curative Intent, Discussed with Patient

## 2023-06-26 NOTE — Progress Notes (Signed)
 Palliative Medicine [facility]  [phone] [phone]   Name: [name] Date: 06/26/2023 MRN: [id number]  DOB: 1945/03/06  Patient Care Team: Roslyn Coombe, MD as PCP - Jan Mcgill, MD as PCP - Cardiology (Cardiology) Daryel Ensign, DO as Consulting Physician (Neurology) Sherwood Donath as Physician Assistant (Cardiology) Syliva Even, MD as Consulting Physician (Family Medicine) Rosalita Combe, RN as Oncology Nurse Navigator Pickenpack-Cousar, Giles Labrum, NP as Nurse Practitioner (Hospice and Palliative Medicine)    REASON FOR CONSULTATION: [name] is a 78 y.o. female with oncologic medical history including stage IIIB non-small cell lung cancer (03/2023) s/p concurrent chemoradiation with plans for immunotherapy starting 07/03/23.  Palliative is seeing patient for symptom management and goals of care.    SOCIAL HISTORY:     reports that she quit smoking about 37 years ago. Her smoking use included cigarettes. She started smoking about 67 years ago. She has never used smokeless tobacco. She reports that she does not drink alcohol and does not use drugs.  ADVANCE DIRECTIVES:  None on file, would like to complete.   CODE STATUS: DNR  PAST MEDICAL HISTORY: Past Medical History:  Diagnosis Date   AAA (abdominal aortic aneurysm) (HCC)    a. s/p stent graft repair 01/2016.   Acute ischemic colitis (HCC) 07/29/2010   Diagnosed June, 2012 characterized by acute lower GI bleeding, spontaneously resolved.    Acute respiratory infection 06/09/2014   Acute sinus infection 05/14/2015   Allergic rhinitis, cause unspecified    Angioedema of lips 06/03/2014   Anxiety state, unspecified    Arthritis of left hip 04/16/2015   Bilateral hearing loss 07/19/2017   Chronic LBP    Coronary artery disease    a. inferior MI 1998 s/p PCI of RCA. b. stenting of Cx 04/2010. c. DES to prox LAD 05/2013. d. DES x 3 in 10/2015. //  Myoview  07/2018:  EF 59, normal perfusion, low risk    Coronary artery disease involving native heart without angina pectoris 07/31/2006   Qualifier: Diagnosis of  By: Edmonia Gottron MD, Beauford Bounds  ANGIOGRAPHIC DATA:  1. Ventriculography done in the RAO projection reveals a small wall  motion abnormality in the mid inferior wall. Ejection fraction  would be estimated at around 50%.  2. The right coronary artery has some progressive disease of about 60-  80% in the proximal mid segment. The distal vessel appears to be  50-70% narrowing althou   Degeneration of lumbar or lumbosacral intervertebral disc    Depressive disorder, not elsewhere classified    Diabetes (HCC) 11/03/2006   Qualifier: Diagnosis of  By: Autry Legions MD, Alveda Aures    Diabetes mellitus    TYPE II   Frequent urination 05/08/2014   Gout 08/22/2013   History of endovascular stent graft for abdominal aortic aneurysm (AAA) 01/22/2016   History of radiation therapy    Right lung-04/19/23-05/29/23-Dr. Retta Caster   Hyperlipidemia    Hypertension    Hypothyroidism    Iron  deficiency anemia 06/13/2017   Lower GI bleed 06/2010   Diverticular bleed   Lumbar radiculopathy 05/15/2015   MENOPAUSAL DISORDER 10/29/2007   Qualifier: Diagnosis of  By: Autry Legions MD, Alveda Aures    Myalgia 09/25/2013   Noncompliance with medications 02/26/2014   Obesity, unspecified    OTITIS MEDIA, SEROUS, CHRONIC 04/23/2007   Qualifier: Diagnosis of  By: Edmonia Gottron MD, Beauford Bounds    Thoracic disc disease 11/19/2018  URTICARIA 09/23/2009   Qualifier: Diagnosis of  By: Myrtice Athens MD, Tarri Farm A    Venous insufficiency 07/19/2017    PAST SURGICAL HISTORY:  Past Surgical History:  Procedure Laterality Date   CARDIAC CATHETERIZATION     PCI OF BOTH THE CIRCUMFLEX AND LEFT ANTERIOR DESCENDING ARTERY   CARDIAC CATHETERIZATION N/A 10/29/2015   Procedure: Coronary Stent Intervention;  Surgeon: Sammy Crisp, MD;  Location: [facility] INVASIVE CV LAB;  Service: Cardiovascular;  Laterality:  N/A;   CARDIAC CATHETERIZATION N/A 10/29/2015   Procedure: Coronary/Graft Angiography;  Surgeon: Sammy Crisp, MD;  Location: [facility] INVASIVE CV LAB;  Service: Cardiovascular;  Laterality: N/A;   CARDIAC CATHETERIZATION N/A 10/29/2015   Procedure: Intravascular Pressure Wire/FFR Study;  Surgeon: Sammy Crisp, MD;  Location: [facility] INVASIVE CV LAB;  Service: Cardiovascular;  Laterality: N/A;   CESAREAN SECTION     ENDOVASCULAR STENT INSERTION N/A 01/22/2016   Procedure: ABDOMINAL AORTIC ENDOVASCULAR STENT GRAFT INSERTION;  Surgeon: Mayo Speck, MD;  Location: [facility] OR;  Service: Vascular;  Laterality: N/A;   HEART STENT  04-2010  and  Jun 07, 2013   X 3   IR IMAGING GUIDED PORT INSERTION  05/17/2023   IR RADIOLOGIST EVAL & MGMT  06/21/2023   LEFT HEART CATH AND CORONARY ANGIOGRAPHY N/A 04/03/2019   Procedure: LEFT HEART CATH AND CORONARY ANGIOGRAPHY;  Surgeon: Sammy Crisp, MD;  Location: [facility] INVASIVE CV LAB;  Service: Cardiovascular;  Laterality: N/A;   LEFT HEART CATHETERIZATION WITH CORONARY ANGIOGRAM N/A 06/07/2013   Procedure: LEFT HEART CATHETERIZATION WITH CORONARY ANGIOGRAM;  Surgeon: Odie Benne, MD;  Location[facility] CATH LAB;  Service: Cardiovascular;  Laterality: N/A;   LEFT HEART CATHETERIZATION WITH CORONARY ANGIOGRAM N/A 02/25/2014   Procedure: LEFT HEART CATHETERIZATION WITH CORONARY ANGIOGRAM;  Surgeon: Millicent Ally, MD;  Location: [facility] CATH LAB;  Service: Cardiovascular;  Laterality: N/A;   LUMBAR FUSION  01/2007   DR. Faylene Hoots...3-LEVEL WITH FIXATION   OOPHORECTOMY     BSO? pt.unsure   PARATHYROIDECTOMY     SPINE SURGERY     THYROIDECTOMY     TOTAL ABDOMINAL HYSTERECTOMY      HEMATOLOGY/ONCOLOGY HISTORY:  Oncology History  Primary squamous cell carcinoma of upper lobe of right lung (HCC)  04/10/2023 Initial Diagnosis   Primary squamous cell carcinoma of upper lobe of right lung (HCC)   04/10/2023 Cancer Staging   Staging form: Lung, AJCC V9 - Clinical: Stage IIIB (cT4,  cN2b, cM0) - Signed by Marlene Simas, MD on 04/10/2023 Method of lymph node assessment: Clinical   05/08/2023 - 05/22/2023 Chemotherapy   Patient is on Treatment Plan : LUNG Carboplatin  + Paclitaxel  + XRT q7d     07/03/2023 -  Chemotherapy   Patient is on Treatment Plan : LUNG NSCLC Durvalumab (1500) q28d       ALLERGIES:  is allergic to crestor  [rosuvastatin  calcium ], prilosec [omeprazole ], miconazole nitrate, augmentin  [amoxicillin -pot clavulanate], and doxycycline .  MEDICATIONS:  Current Outpatient Medications  Medication Sig Dispense Refill   oxyCODONE  (OXY IR/ROXICODONE ) 5 MG immediate release tablet Take 1-2 tablets (5-10 mg total) by mouth every 4 (four) hours as needed for severe pain (pain score 7-10). 60 tablet 0   aspirin  81 MG EC tablet Take 81 mg by mouth daily.      atenolol  (TENORMIN ) 25 MG tablet Take 0.5 tablets (12.5 mg total) by mouth daily. 90 tablet 0   Blood Pressure Monitoring (OMRON 3 SERIES BP MONITOR) DEVI Use as directed. 1 each 0   Foot Care Products (  [employer] GEL HEEL CUSHION WOMENS) PADS Use as directed at bedtime 2 each 5   gabapentin  (NEURONTIN ) 300 MG capsule Take 1-2 tab by mouth at bedtime for pain and sleep (Patient taking differently: Take 300 mg by mouth as needed. Take 1-2 tab by mouth at bedtime for pain and sleep) 180 capsule 1   levothyroxine  (SYNTHROID ) 100 MCG tablet Take 1 tablet (100 mcg total) by mouth daily before breakfast. 30 tablet 0   lidocaine -prilocaine  (EMLA ) cream Apply 1 Application topically as needed. 30 g 2   prochlorperazine  (COMPAZINE ) 10 MG tablet Take 1 tablet (10 mg total) by mouth every 6 (six) hours as needed. 30 tablet 2   temazepam  (RESTORIL ) 15 MG capsule Take 1 capsule (15 mg total) by mouth at bedtime as needed for sleep. 30 capsule 2   Triamcinolone  Acetonide (ZILRETTA ) 32 MG SRER intra-articular injection 5 mL, intra-articular, bilat knees 2 each 0   No current facility-administered medications for this visit.    VITAL  SIGNS: There were no vitals taken for this visit. There were no vitals filed for this visit.  Estimated body mass index is 26.63 kg/m as calculated from the following:   Height as of an earlier encounter on 06/26/23: 5\' 6"  (1.676 m).   Weight as of an earlier encounter on 06/26/23: 165 lb (74.8 kg).  LABS: CBC:    Component Value Date/Time   WBC 5.4 06/15/2023 1059   WBC 8.6 04/22/2023 1830   HGB 10.9 (L) 06/15/2023 1059   HGB 11.0 (L) 07/17/2017 1408   HCT 33.9 (L) 06/15/2023 1059   HCT 36.7 07/17/2017 1408   PLT 245 06/15/2023 1059   PLT 433 07/17/2017 1408   MCV 87.4 06/15/2023 1059   MCV 80 07/17/2017 1408   NEUTROABS 4.5 06/15/2023 1059   NEUTROABS 4.8 07/17/2017 1408   LYMPHSABS 0.6 (L) 06/15/2023 1059   LYMPHSABS 2.2 07/17/2017 1408   MONOABS 0.3 06/15/2023 1059   EOSABS 0.0 06/15/2023 1059   EOSABS 0.1 07/17/2017 1408   BASOSABS 0.0 06/15/2023 1059   BASOSABS 0.0 07/17/2017 1408   Comprehensive Metabolic Panel:    Component Value Date/Time   NA 143 06/15/2023 1059   NA 141 06/08/2021 1606   K 3.6 06/15/2023 1059   CL 106 06/15/2023 1059   CO2 31 06/15/2023 1059   BUN 6 (L) 06/15/2023 1059   BUN 4 (L) 06/08/2021 1606   CREATININE 0.56 06/15/2023 1059   GLUCOSE 119 (H) 06/15/2023 1059   GLUCOSE 166 (H) 01/02/2006 0825   CALCIUM  8.5 (L) 06/15/2023 1059   AST 9 (L) 06/15/2023 1059   ALT <5 06/15/2023 1059   ALKPHOS 100 06/15/2023 1059   BILITOT 0.5 06/15/2023 1059   PROT 7.4 06/15/2023 1059   PROT 7.1 10/05/2022 0818   ALBUMIN 3.3 (L) 06/15/2023 1059   ALBUMIN 4.0 10/05/2022 0818    RADIOGRAPHIC STUDIES: DG Knee 1-2 Views Right Result Date: 06/23/2023 CLINICAL DATA:  BILATERAL knee pain EXAM: LEFT KNEE - 1-2 VIEW; RIGHT KNEE - 1-2 VIEW COMPARISON:  November 21, 2019 FINDINGS: No acute fracture or dislocation. Severe RIGHT greater than LEFT tricompartmental osteoarthritis with complete joint space loss, bone-on-bone apposition, osseous remodeling and multiple  intra-articular loose bodies. 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 Degenerative changes are worst in the LATERAL compartment bilaterally. Trace effusion, likely reactive. Severe vascular atherosclerotic calcifications. IMPRESSION: RIGHT greater than LEFT severe tricompartmental osteoarthritis bone on bone apposition and multiple intra-articular loose bodies. Electronically Signed   By: Autry Legions  Sanford Crumble M.D.   On: 06/23/2023 09:28   DG Knee 1-2 Views Left Result Date: 06/23/2023 CLINICAL DATA:  BILATERAL knee pain EXAM: LEFT KNEE - 1-2 VIEW; RIGHT KNEE - 1-2 VIEW COMPARISON:  November 21, 2019 FINDINGS: No acute fracture or dislocation. Severe RIGHT greater than LEFT tricompartmental osteoarthritis with complete joint space loss, bone-on-bone apposition, osseous remodeling and multiple intra-articular loose bodies. 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 Degenerative changes are worst in the LATERAL compartment bilaterally. Trace effusion, likely reactive. Severe vascular atherosclerotic calcifications. IMPRESSION: RIGHT greater than LEFT severe tricompartmental osteoarthritis bone on bone apposition and multiple intra-articular loose bodies. Electronically Signed   By: Shannan Dart M.D.   On: 06/23/2023 09:28   IR Radiologist Eval & Mgmt Result Date: 06/21/2023 EXAM: NEW PATIENT OFFICE VISIT CHIEF COMPLAINT: See Epic note. HISTORY OF PRESENT ILLNESS: See Epic note. REVIEW OF SYSTEMS: See Epic note. PHYSICAL EXAMINATION: See Epic note. ASSESSMENT AND PLAN: See Epic note. Creasie Doctor, MD [facility] Electronically Signed   By: Creasie Doctor M.D.   On: 06/21/2023 15:59   CT Chest W Contrast Result Date: 06/19/2023 EXAM: CT CHEST WITH CONTRAST 06/15/2023 12:26:37 PM TECHNIQUE: CT of the chest was performed with the administration of intravenous contrast. Multiplanar reformatted images are provided for  review. Automated exposure control, iterative reconstruction, and/or weight based adjustment of the mA/kV was utilized to reduce the radiation dose to as low as reasonably achievable. COMPARISON: PET CT dated 04/21/2023 and CT chest dated 03/20/2023. CLINICAL HISTORY: Non-small cell lung cancer (NSCLC), non-metastatic, assess treatment response. Primary squamous cell carcinoma of upper lobe of right lung. FINDINGS: MEDIASTINUM: Heart and pericardium are unremarkable. The central airways are clear. Moderate 3-vessel coronary atherosclerosis. LYMPH NODES: No mediastinal, hilar or axillary lymphadenopathy. LUNGS AND PLEURA: Lateral right apical pleural thickening with underlying 4.1 x 2.5 cm thin-walled cavitary mass extending to the pleural surface/chest wall, corresponding to the patient's known primary bronchogenic carcinoma with radiation changes. No focal consolidation or pulmonary edema. No pleural effusion or pneumothorax. SOFT TISSUES/BONES: Cortical irregularity of the right lateral third rib, suggesting tumor involvement versus posttreatment changes. Mild anterior wedging at T10 with thoracolumbar spine fixation and hardware extending from T4 through L1. Degenerative changes of the visualized thoracolumbar spine. UPPER ABDOMEN: 2.8 cm left hepatic cyst, benign. IMPRESSION: 1. Improving right apical mass/pleural thickening, corresponding to the patient's known primary bronchogenic carcinoma, with radiation changes. 2. Stable cortical irregularity of the right lateral third rib, suggesting tumor involvement versus posttreatment changes. Electronically signed by: Zadie Herter MD 06/19/2023 02:04 AM EDT RP Workstation: [id number]   US  LIMITED JOINT SPACE STRUCTURES LOW BILAT(NO LINKED CHARGES) Result Date: 06/16/2023 Zilretta  injection right knee Procedure: Real-time Ultrasound Guided Injection of right knee joint superior lateral patellar space Device: Philips Affiniti 50G Images permanently stored and  available for review in PACS Verbal informed consent obtained.  Discussed risks and benefits of procedure. Warned about infection, hyperglycemia bleeding, damage to structures among others. Patient expresses understanding and agreement Time-out conducted.  Noted no overlying erythema, induration, or other signs of local infection.  Skin prepped in a sterile fashion.  Local anesthesia: Topical Ethyl chloride.  With sterile technique and under real time ultrasound guidance: Zilretta  32 mg injected into knee joint. Fluid seen entering the joint capsule.  Completed without difficulty  Advised to call if fevers/chills, erythema, induration, drainage, or persistent bleeding.  Images permanently stored and available for review in the ultrasound unit. Impression: Technically successful  ultrasound guided injection.  Procedure: Real-time Ultrasound Guided Injection of left knee joint superior lateral patellar space Device: Philips Affiniti 50G Images permanently stored and available for review in PACS Verbal informed consent obtained.  Discussed risks and benefits of procedure. Warned about infection, hyperglycemia bleeding, damage to structures among others. Patient expresses understanding and agreement Time-out conducted.  Noted no overlying erythema, induration, or other signs of local infection.  Skin prepped in a sterile fashion.  Local anesthesia: Topical Ethyl chloride.  With sterile technique and under real time ultrasound guidance: Zilretta  32 mg injected into knee joint. Fluid seen entering the joint capsule.  Completed without difficulty  Advised to call if fevers/chills, erythema, induration, drainage, or persistent bleeding.  Images permanently stored and available for review in the ultrasound unit. Impression: Technically successful ultrasound guided injection.  Lot number: [id number] for both injections     PERFORMANCE STATUS (ECOG) : 1 - Symptomatic but completely ambulatory  Review of Systems   Constitutional:  Positive for activity change, appetite change and fatigue.  Musculoskeletal:  Positive for arthralgias and back pain.  Unless otherwise noted, a complete review of systems is negative.  Physical Exam General: NAD Cardiovascular: regular rate and rhythm Pulmonary: clear ant fields Abdomen: soft, nontender, + bowel sounds Extremities: no edema, no joint deformities Skin: no rashes Neurological: Alert and oriented x3  IMPRESSION: Discussed the use of AI scribe software for clinical note transcription with the patient, who gave verbal consent to proceed.  History of Present Illness Achol BELANNA MANRING is a 78 year old female who presents to clinic for her initial palliative visit. She is complaining of fatigue, decreased appetite, and right-sided pain. She is accompanied by her daughter, [name] who is her primary caregiver. Patient is alert and able to engage appropriately in discussions.   I introduced myself, Maygan RN, and Palliative's role in collaboration with the oncology team. Concept of Palliative Care was introduced as specialized medical care for people and their families living with serious illness.  It focuses on providing relief from the symptoms and stress of a serious illness.  The goal is to improve quality of life for both the patient and the family. Values and goals of care important to patient and family were attempted to be elicited.   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 She has three children, including a set of twins, and resides locally with her family. She has four grandchildren and six great-grandchildren.  At home patient is able to perform most ADLs independent with some limitations due to fatigue and pain.   She experiences persistent fatigue and low energy levels, spending most of her day sitting, reclining, watching TV, or sleeping. Her appetite is variable, with more food  intake in the evening than during the day. She notes a weight decrease from 170 lbs on May 5th to 165 lbs today, although her weight had previously increased from 162 lbs in March to 170 lbs in late April. We discussed focusing on small frequent meals, ways to increase protein intake. She denies nausea, vomiting, or diarrhea.   The patient experiences constipation intermittently, which is managed with Miralax , although she does not take it daily. Encouraged patient to take daily Miralax  to allow for better bowel pattern.   She has difficulty sleeping at night, often sleeping better in the morning. She has previously been prescribed temazepam  for sleep but has not been taking it regularly. Recommended  continuing with current dosing of temazepam  with plans for close follow-up.   Destinae experiences right-sided pain and has been using oxycodone  and hydrocodone  for pain management. Hydrocodone  10/325 mg does not alleviate her pain and she reports upset of stomach. She endorses relief with use of oxycodone . Her last prescription for pain medication was in April,. Patient reports pushing thru her discomfort due to lack of medication. We discussed as needed use of oxycodone  for moderate to severe pain. She describes pain as sharp, stabbing, throbbing. Pain worsens with activity.   We will continue to closely monitor and support.   Goals of Care We discussed her current illness and what it means in the larger context of her on-going co-morbidities. Natural disease trajectory and expectations were discussed. Patient and her daughter are realistic in their understanding of patient's overall condition and cancer state.   I empathetically approached discussions regarding healthcare limitations, code status, and advanced directives. Patient does not have advanced directives however reports interest in completing. Extensive education provided on AD documents. Patient would like to complete at advanced directive clinic  today. She expresses wishes to name her daughter, [name] as her medical decision maker in the event she is unable to speak for herself, desires a natural death with no CPR or life-sustaining measures in a terminal or comatose state. Open to artificial feedings for trial if condition is reversible.   Ms. Hurd is clear in her expressed wishes to continue to treat the treatable allowing her every opportunity to continue thriving while managing her symptoms to minimize discomfort.   I discussed the importance of continued conversation with family and their medical providers regarding overall plan of care and treatment options, ensuring decisions are within the context of the patients values and GOCs. Assessment & Plan Established therapeutic relationship. Education provided on palliative's role in collaboration with their Oncology/Radiation team.  Cancer Related Pain management Chronic right-sided pain managed effectively with oxycodone . Previous prescriptions lapsed due to missed appointments. - Prescribe oxycodone  5 mg, 1-2 tablets as needed every- hrs for pain. - Instruct to contact when down to 2-3 days' supply for refill.  Constipation Intermittent constipation likely due to opioid use. Miralax  recommended for its gentle effect. - Instruct to take Miralax  once daily, increase to twice daily if needed. - Consider alternative if Miralax  is ineffective.  Sleep disturbance Difficulty sleeping at night. Temazepam  will be tried first for sleep issues. - Instruct to try temazepam  15 mg at bedtime for sleep as previously prescribed.   Goals of Care Discussed advanced directives and goals of care. She desires a natural death without life-sustaining measures. - Complete advanced directives paperwork at AD clinic today.  - Daughter is designated as Midwife.  I will plan to follow-up with patient in 2-3 weeks. Sooner if needed.  -- Patient expressed understanding and was in  agreement with this plan. She also understands that She can call the clinic at any time with any questions, concerns, or complaints.   Thank you for your referral and allowing Palliative to assist in Ms. Hamdi H Dibiasio's care.   Number and complexity of problems addressed: HIGH - 1 or more chronic illnesses with SEVERE exacerbation, progression, or side effects of treatment - advanced cancer, pain. Any controlled substances utilized were prescribed in the context of palliative care.  Visit consisted of counseling and education dealing with the complex and emotionally intense issues of symptom management and palliative care in the setting of serious and potentially life-threatening illness.  Signed by: Dellia Ferguson, AGPCNP-BC Palliative Medicine Team/[facility]

## 2023-06-26 NOTE — Progress Notes (Signed)
 [facility] [phone]   [phone]  OFFICE PROGRESS NOTE  Brandi Coombe, MD [address] [place] [address]  DIAGNOSIS: Stage IIIB (T4, N2b, M0) non-small cell lung cancer, squamous cell carcinoma. She presented with large pleural-based peripheral right upper lobe lung mass measuring up to 8.2 cm in size with large right paratracheal node with low level hypermetabolic uptake.  Diagnosed in March 2025.   PDL1: 15%   PRIOR THERAPY: concurrent chemoradiation with carboplatin  for an AUC of 2 and taxol  45 mg/m2. First dose expected on 05/08/23.  Status post 4 cycles.  Last dose was given on 05/30/2023.   CURRENT THERAPY: Consolidation treatment with immunotherapy with Imfinzi 1500 Mg IV every 4 weeks.  First dose 07/03/2023.  INTERVAL HISTORY: [name] 78 y.o. female returns to the clinic today for follow-up visit accompanied by her daughter. Discussed the use of AI scribe software for clinical note transcription with the patient, who gave verbal consent to proceed.  History of Present Illness   [name] is a 78 year old female with stage three B non-small cell lung cancer who presents for evaluation and repeat CT scan for restaging. She is accompanied by her daughter, Emmette Harms.  Diagnosed with stage three B non-small cell lung cancer, squamous cell carcinoma, in March 2025, she completed a course of concurrent chemoradiation with weekly carboplatin  and paclitaxel  for four cycles. She is currently here for evaluation and a repeat CT scan of the chest to restage her disease.  She experiences intermittent pain on the right side, which she manages with pain medication. The pain has improved since completing her treatment.  She experiences fatigue, sleepiness, and low energy levels, which she describes as feeling 'ruggy'. She mentions a recent fall that occurred about a week ago, during which she lost her balance but does not specify any  injuries from the fall.      MEDICAL HISTORY: Past Medical History:  Diagnosis Date   AAA (abdominal aortic aneurysm) (HCC)    a. s/p stent graft repair 01/2016.   Acute ischemic colitis (HCC) 07/29/2010   Diagnosed June, 2012 characterized by acute lower GI bleeding, spontaneously resolved.    Acute respiratory infection 06/09/2014   Acute sinus infection 05/14/2015   Allergic rhinitis, cause unspecified    Angioedema of lips 06/03/2014   Anxiety state, unspecified    Arthritis of left hip 04/16/2015   Bilateral hearing loss 07/19/2017   Chronic LBP    Coronary artery disease    a. inferior MI 1998 s/p PCI of RCA. b. stenting of Cx 04/2010. c. DES to prox LAD 05/2013. d. DES x 3 in 10/2015. // Myoview  07/2018:  EF 59, normal perfusion, low risk    Coronary artery disease involving native heart without angina pectoris 07/31/2006   Qualifier: Diagnosis of  By: Edmonia Gottron MD, Beauford Bounds  ANGIOGRAPHIC DATA:  1. Ventriculography done in the RAO projection reveals a small wall  motion abnormality in the mid inferior wall. Ejection fraction  would be estimated at around 50%.  2. The right coronary artery has some progressive disease of about 60-  80% in the proximal mid segment. The distal vessel appears to be  50-70% narrowing althou   Degeneration of lumbar or lumbosacral intervertebral disc    Depressive disorder, not elsewhere classified    Diabetes (HCC) 11/03/2006   Qualifier: Diagnosis of  By: Autry Legions MD, Alveda Aures    Diabetes mellitus  TYPE II   Frequent urination 05/08/2014   Gout 08/22/2013   History of endovascular stent graft for abdominal aortic aneurysm (AAA) 01/22/2016   History of radiation therapy    Right lung-04/19/23-05/29/23-Dr. Retta Caster   Hyperlipidemia    Hypertension    Hypothyroidism    Iron  deficiency anemia 06/13/2017   Lower GI bleed 06/2010   Diverticular bleed   Lumbar radiculopathy 05/15/2015   MENOPAUSAL DISORDER 10/29/2007   Qualifier: Diagnosis of  By:  Autry Legions MD, Alveda Aures    Myalgia 09/25/2013   Noncompliance with medications 02/26/2014   Obesity, unspecified    OTITIS MEDIA, SEROUS, CHRONIC 04/23/2007   Qualifier: Diagnosis of  By: Edmonia Gottron MD, Beauford Bounds    Thoracic disc disease 11/19/2018   URTICARIA 09/23/2009   Qualifier: Diagnosis of  By: Myrtice Athens MD, Tarri Farm A    Venous insufficiency 07/19/2017    ALLERGIES:  is allergic to crestor  [rosuvastatin  calcium ], prilosec [omeprazole ], miconazole nitrate, augmentin  [amoxicillin -pot clavulanate], and doxycycline .  MEDICATIONS:  Current Outpatient Medications  Medication Sig Dispense Refill   aspirin  81 MG EC tablet Take 81 mg by mouth daily.      atenolol  (TENORMIN ) 25 MG tablet Take 0.5 tablets (12.5 mg total) by mouth daily. 90 tablet 0   atorvastatin  (LIPITOR ) 40 MG tablet Take 1 tablet (40 mg total) by mouth daily. 90 tablet 3   Blood Pressure Monitoring (OMRON 3 SERIES BP MONITOR) DEVI Use as directed. 1 each 0   fluconazole  (DIFLUCAN ) 150 MG tablet Take one tab by mouth every 3 days as needed 2 tablet 1   Foot Care Products ([employer] GEL HEEL CUSHION WOMENS) PADS Use as directed at bedtime 2 each 5   gabapentin  (NEURONTIN ) 300 MG capsule Take 1-2 tab by mouth at bedtime for pain and sleep (Patient taking differently: Take 300 mg by mouth as needed. Take 1-2 tab by mouth at bedtime for pain and sleep) 180 capsule 1   HYDROcodone -acetaminophen  (NORCO) 10-325 MG tablet Take 1 tablet by mouth every 8 (eight) hours as needed. 60 tablet 0   isosorbide  mononitrate (IMDUR ) 30 MG 24 hr tablet Take 0.5 tablets (15 mg total) by mouth daily. 30 tablet 0   levofloxacin  (LEVAQUIN ) 500 MG tablet Take 1 tablet (500 mg total) by mouth daily. 10 tablet 0   levothyroxine  (SYNTHROID ) 100 MCG tablet Take 1 tablet (100 mcg total) by mouth daily before breakfast. 30 tablet 0   lidocaine -prilocaine  (EMLA ) cream Apply 1 Application topically as needed. 30 g 2   phenazopyridine  (PYRIDIUM ) 200 MG tablet Take 1 tablet  (200 mg total) by mouth 3 (three) times daily as needed for pain (with urination). 25 tablet 0   prochlorperazine  (COMPAZINE ) 10 MG tablet Take 1 tablet (10 mg total) by mouth every 6 (six) hours as needed. 30 tablet 2   temazepam  (RESTORIL ) 15 MG capsule Take 1 capsule (15 mg total) by mouth at bedtime as needed for sleep. 30 capsule 2   Triamcinolone  Acetonide (ZILRETTA ) 32 MG SRER intra-articular injection 5 mL, intra-articular, bilat knees 2 each 0   No current facility-administered medications for this visit.    SURGICAL HISTORY:  Past Surgical History:  Procedure Laterality Date   CARDIAC CATHETERIZATION     PCI OF BOTH THE CIRCUMFLEX AND LEFT ANTERIOR DESCENDING ARTERY   CARDIAC CATHETERIZATION N/A 10/29/2015   Procedure: Coronary Stent Intervention;  Surgeon: Sammy Crisp, MD;  Location: [facility] INVASIVE CV LAB;  Service: Cardiovascular;  Laterality: N/A;   CARDIAC CATHETERIZATION N/A 10/29/2015  Procedure: Coronary/Graft Angiography;  Surgeon: Sammy Crisp, MD;  Location: [facility] INVASIVE CV LAB;  Service: Cardiovascular;  Laterality: N/A;   CARDIAC CATHETERIZATION N/A 10/29/2015   Procedure: Intravascular Pressure Wire/FFR Study;  Surgeon: Sammy Crisp, MD;  Location: [facility] INVASIVE CV LAB;  Service: Cardiovascular;  Laterality: N/A;   CESAREAN SECTION     ENDOVASCULAR STENT INSERTION N/A 01/22/2016   Procedure: ABDOMINAL AORTIC ENDOVASCULAR STENT GRAFT INSERTION;  Surgeon: Mayo Speck, MD;  Location: [facility] OR;  Service: Vascular;  Laterality: N/A;   HEART STENT  04-2010  and  Jun 07, 2013   X 3   IR IMAGING GUIDED PORT INSERTION  05/17/2023   IR RADIOLOGIST EVAL & MGMT  06/21/2023   LEFT HEART CATH AND CORONARY ANGIOGRAPHY N/A 04/03/2019   Procedure: LEFT HEART CATH AND CORONARY ANGIOGRAPHY;  Surgeon: Sammy Crisp, MD;  Location: [facility] INVASIVE CV LAB;  Service: Cardiovascular;  Laterality: N/A;   LEFT HEART CATHETERIZATION WITH CORONARY ANGIOGRAM N/A 06/07/2013   Procedure: LEFT HEART  CATHETERIZATION WITH CORONARY ANGIOGRAM;  Surgeon: Odie Benne, MD;  Location: [facility] CATH LAB;  Service: Cardiovascular;  Laterality: N/A;   LEFT HEART CATHETERIZATION WITH CORONARY ANGIOGRAM N/A 02/25/2014   Procedure: LEFT HEART CATHETERIZATION WITH CORONARY ANGIOGRAM;  Surgeon: Millicent Ally, MD;  Location: [facility] CATH LAB;  Service: Cardiovascular;  Laterality: N/A;   LUMBAR FUSION  01/2007   DR. Faylene Hoots...3-LEVEL WITH FIXATION   OOPHORECTOMY     BSO? pt.unsure   PARATHYROIDECTOMY     SPINE SURGERY     THYROIDECTOMY     TOTAL ABDOMINAL HYSTERECTOMY      REVIEW OF SYSTEMS:  Constitutional: positive for fatigue Eyes: negative Ears, nose, mouth, throat, and face: negative Respiratory: positive for pleurisy/chest pain Cardiovascular: negative Gastrointestinal: negative Genitourinary:negative Integument/breast: negative Hematologic/lymphatic: negative Musculoskeletal:negative Neurological: negative Behavioral/Psych: negative Endocrine: negative Allergic/Immunologic: negative   PHYSICAL EXAMINATION: General appearance: alert, cooperative, fatigued, and no distress Head: Normocephalic, without obvious abnormality, atraumatic Neck: no adenopathy, no JVD, supple, symmetrical, trachea midline, and thyroid  not enlarged, symmetric, no tenderness/mass/nodules Lymph nodes: Cervical, supraclavicular, and axillary nodes normal. Resp: clear to auscultation bilaterally Back: symmetric, no curvature. ROM normal. No CVA tenderness. Cardio: regular rate and rhythm, S1, S2 normal, no murmur, click, rub or gallop GI: soft, non-tender; bowel sounds normal; no masses,  no organomegaly Extremities: extremities normal, atraumatic, no cyanosis or edema Neurologic: Alert and oriented X 3, normal strength and tone. Normal symmetric reflexes. Normal coordination and gait  ECOG PERFORMANCE STATUS: 1 - Symptomatic but completely ambulatory  Blood pressure 100/60, pulse 74, temperature 97.6 F (36.4 C),  temperature source Tympanic, resp. rate 18, height 5\' 6"  (1.676 m), weight 165 lb 8 oz (75.1 kg), SpO2 100%.  LABORATORY DATA: Lab Results  Component Value Date   WBC 5.4 06/15/2023   HGB 10.9 (L) 06/15/2023   HCT 33.9 (L) 06/15/2023   MCV 87.4 06/15/2023   PLT 245 06/15/2023      Chemistry      Component Value Date/Time   NA 143 06/15/2023 1059   NA 141 06/08/2021 1606   K 3.6 06/15/2023 1059   CL 106 06/15/2023 1059   CO2 31 06/15/2023 1059   BUN 6 (L) 06/15/2023 1059   BUN 4 (L) 06/08/2021 1606   CREATININE 0.56 06/15/2023 1059      Component Value Date/Time   CALCIUM  8.5 (L) 06/15/2023 1059   ALKPHOS 100 06/15/2023 1059   AST 9 (L) 06/15/2023 1059   ALT <5 06/15/2023 1059  BILITOT 0.5 06/15/2023 1059       RADIOGRAPHIC STUDIES: DG Knee 1-2 Views Right Result Date: 06/23/2023 CLINICAL DATA:  BILATERAL knee pain EXAM: LEFT KNEE - 1-2 VIEW; RIGHT KNEE - 1-2 VIEW COMPARISON:  November 21, 2019 FINDINGS: No acute fracture or dislocation. Severe RIGHT greater than LEFT tricompartmental osteoarthritis with complete joint space loss, bone-on-bone apposition, osseous remodeling and multiple intra-articular loose bodies. 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 Degenerative changes are worst in the LATERAL compartment bilaterally. Trace effusion, likely reactive. Severe vascular atherosclerotic calcifications. IMPRESSION: RIGHT greater than LEFT severe tricompartmental osteoarthritis bone on bone apposition and multiple intra-articular loose bodies. Electronically Signed   By: Shannan Dart M.D.   On: 06/23/2023 09:28   DG Knee 1-2 Views Left Result Date: 06/23/2023 CLINICAL DATA:  BILATERAL knee pain EXAM: LEFT KNEE - 1-2 VIEW; RIGHT KNEE - 1-2 VIEW COMPARISON:  November 21, 2019 FINDINGS: No acute fracture or dislocation. Severe RIGHT greater than LEFT tricompartmental osteoarthritis with complete joint space loss, bone-on-bone apposition, osseous remodeling and  multiple intra-articular loose bodies. 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 Degenerative changes are worst in the LATERAL compartment bilaterally. Trace effusion, likely reactive. Severe vascular atherosclerotic calcifications. IMPRESSION: RIGHT greater than LEFT severe tricompartmental osteoarthritis bone on bone apposition and multiple intra-articular loose bodies. Electronically Signed   By: Shannan Dart M.D.   On: 06/23/2023 09:28   IR Radiologist Eval & Mgmt Result Date: 06/21/2023 EXAM: NEW PATIENT OFFICE VISIT CHIEF COMPLAINT: See Epic note. HISTORY OF PRESENT ILLNESS: See Epic note. REVIEW OF SYSTEMS: See Epic note. PHYSICAL EXAMINATION: See Epic note. ASSESSMENT AND PLAN: See Epic note. Creasie Doctor, MD [facility] Electronically Signed   By: Creasie Doctor M.D.   On: 06/21/2023 15:59   CT Chest W Contrast Result Date: 06/19/2023 EXAM: CT CHEST WITH CONTRAST 06/15/2023 12:26:37 PM TECHNIQUE: CT of the chest was performed with the administration of intravenous contrast. Multiplanar reformatted images are provided for review. Automated exposure control, iterative reconstruction, and/or weight based adjustment of the mA/kV was utilized to reduce the radiation dose to as low as reasonably achievable. COMPARISON: PET CT dated 04/21/2023 and CT chest dated 03/20/2023. CLINICAL HISTORY: Non-small cell lung cancer (NSCLC), non-metastatic, assess treatment response. Primary squamous cell carcinoma of upper lobe of right lung. FINDINGS: MEDIASTINUM: Heart and pericardium are unremarkable. The central airways are clear. Moderate 3-vessel coronary atherosclerosis. LYMPH NODES: No mediastinal, hilar or axillary lymphadenopathy. LUNGS AND PLEURA: Lateral right apical pleural thickening with underlying 4.1 x 2.5 cm thin-walled cavitary mass extending to the pleural surface/chest wall, corresponding to the patient's known primary  bronchogenic carcinoma with radiation changes. No focal consolidation or pulmonary edema. No pleural effusion or pneumothorax. SOFT TISSUES/[name]: Cortical irregularity of the right lateral third rib, suggesting tumor involvement versus posttreatment changes. Mild anterior wedging at T10 with thoracolumbar spine fixation and hardware extending from T4 through L1. Degenerative changes of the visualized thoracolumbar spine. UPPER ABDOMEN: 2.8 cm left hepatic cyst, benign. IMPRESSION: 1. Improving right apical mass/pleural thickening, corresponding to the patient's known primary bronchogenic carcinoma, with radiation changes. 2. Stable cortical irregularity of the right lateral third rib, suggesting tumor involvement versus posttreatment changes. Electronically signed by: Zadie Herter MD 06/19/2023 02:04 AM EDT RP Workstation: [id number]   US  LIMITED JOINT SPACE STRUCTURES LOW BILAT(NO LINKED CHARGES) Result Date: 06/16/2023 Zilretta  injection right knee Procedure: Real-time Ultrasound Guided Injection of right knee joint superior lateral patellar space Device:  Philips Affiniti 50G Images permanently stored and available for review in PACS Verbal informed consent obtained.  Discussed risks and benefits of procedure. Warned about infection, hyperglycemia bleeding, damage to structures among others. Patient expresses understanding and agreement Time-out conducted.  Noted no overlying erythema, induration, or other signs of local infection.  Skin prepped in a sterile fashion.  Local anesthesia: Topical Ethyl chloride.  With sterile technique and under real time ultrasound guidance: Zilretta  32 mg injected into knee joint. Fluid seen entering the joint capsule.  Completed without difficulty  Advised to call if fevers/chills, erythema, induration, drainage, or persistent bleeding.  Images permanently stored and available for review in the ultrasound unit. Impression: Technically successful ultrasound guided  injection.  Procedure: Real-time Ultrasound Guided Injection of left knee joint superior lateral patellar space Device: Philips Affiniti 50G Images permanently stored and available for review in PACS Verbal informed consent obtained.  Discussed risks and benefits of procedure. Warned about infection, hyperglycemia bleeding, damage to structures among others. Patient expresses understanding and agreement Time-out conducted.  Noted no overlying erythema, induration, or other signs of local infection.  Skin prepped in a sterile fashion.  Local anesthesia: Topical Ethyl chloride.  With sterile technique and under real time ultrasound guidance: Zilretta  32 mg injected into knee joint. Fluid seen entering the joint capsule.  Completed without difficulty  Advised to call if fevers/chills, erythema, induration, drainage, or persistent bleeding.  Images permanently stored and available for review in the ultrasound unit. Impression: Technically successful ultrasound guided injection.  Lot number: [id number] for both injections     ASSESSMENT AND PLAN: This is a very pleasant 78 years old African-American female with Stage IIIB (T4, N2b, M0) non-small cell lung cancer, squamous cell carcinoma. She presented with large pleural-based peripheral right upper lobe lung mass measuring up to 8.2 cm in size with large right paratracheal node with low level hypermetabolic uptake. She underwent a course of concurrent chemoradiation with weekly carboplatin  for AUC of 2 and paclitaxel  45 Mg/M2.  Status post 4 cycles. The patient had repeat CT scan of the chest performed recently.  I personally independently reviewed the scan and discussed the result with the patient and her daughter.  Her scan showed improvement of her disease with reduction in the right chest wall mass. Assessment and Plan    Stage 3B non-small cell lung cancer, squamous cell carcinoma Diagnosed in March 2025. Completed initial concurrent chemoradiation with  carboplatin  and paclitaxel . Recent CT scan shows significant tumor reduction, especially near the chest wall, indicating a good response. Transitioning to maintenance immunotherapy to enhance immune response and prevent recurrence. Immunotherapy involves one treatment every four weeks for a year, totaling thirteen doses.  - Initiate immunotherapy with infusions every four weeks for one year. - Schedule next visit in five weeks for the second round of immunotherapy.  Fatigue Experiences intermittent fatigue, sleepiness, and lack of energy, likely related to recent chemoradiation. Anticipate gradual improvement in energy levels as she is no longer on intensive treatment.  Fall Experienced a fall approximately one week ago due to loss of balance. No significant injuries reported. Emphasized the importance of gradual activity to prevent future falls.   She was advised to call immediately if she has any concerning symptoms in the interval. The patient voices understanding of current disease status and treatment options and is in agreement with the current care plan.  All questions were answered. The patient knows to call the clinic with any problems, questions or concerns. We can certainly  see the patient much sooner if necessary. The total time spent in the appointment was 30 minutes including review of chart and various tests results, discussions about plan of care and coordination of care plan .   Disclaimer: This note was dictated with voice recognition software. Similar sounding words can inadvertently be transcribed and may not be corrected upon review.

## 2023-06-26 NOTE — Progress Notes (Signed)
 [name] is here today for follow up post radiation to the lung.  Lung Side: Right Side. She completed her radiation treatment on 05-29-2023.  Does the patient complain of any of the following: Pain:Denies Shortness of breath w/wo exertion: Denies  Cough: Denies Hemoptysis: Denies Pain with swallowing: Denies Swallowing/choking concerns: Denies Appetite: Fair Energy Level: Low Post radiation skin Changes: Denies    Additional comments if applicable:None at this time.  BP 106/60   Pulse 74   Temp 97.6 F (36.4 C)   Resp 18   Ht 5\' 6"  (1.676 m)   Wt 165 lb (74.8 kg)   SpO2 100%   BMI 26.63 kg/m

## 2023-06-26 NOTE — Progress Notes (Signed)
 Radiation Oncology         [phone] ________________________________  Name: [name] MRN: [id number]  Date: 06/26/2023  DOB: 13-Feb-1945  Follow-Up Visit Note  CC: Roslyn Coombe, MD  Roslyn Coombe, MD    ICD-10-CM   1. Primary squamous cell carcinoma of upper lobe of right lung (HCC)  C34.11       Diagnosis: Stage IIIB (T4, N2b, M0) non-small cell lung cancer, squamous cell carcinoma of the right upper lung- .presented with a large pleural-based peripheral right upper lobe lung mass measuring up to 8.2 cm in size with large right paratracheal node with low level hypermetabolic uptake diagnosed in March 2025 : s/p concurrent chemoradiation now on maintenance immunotherapy   Interval Since Last Radiation: 28 days   Intent: Curative   Radiation Treatment Dates: First Treatment Date: 2023-04-19 -- Last Treatment Date: 2023-05-29  Site/Dose/Technique/Mode:  Plan Name: Lung_R Site: Lung, Right Technique: 3D Mode: Photon Dose Per Fraction: 3 Gy Prescribed Dose (Delivered / Prescribed): 24 Gy / 30 Gy Prescribed Fxs (Delivered / Prescribed): 8 / 10   Plan Name: Lung_R_RxChng Site: Lung, Right Technique: 3D Mode: Photon Dose Per Fraction: 1.8 Gy Prescribed Dose (Delivered / Prescribed): 30.6 Gy / 30.6 Gy Prescribed Fxs (Delivered / Prescribed): 17 / 17  Narrative:  The patient returns today for routine follow-up. She tolerated radiation therapy relatively well overall other than fatigue.          To review; since her initial consultation date this past March, she presented for further staging work-up consisting of:  -- An MRI of the brain on 04/21/23 which showed no evidence of intracranial metastatic disease. Other findings of potential clinical significance included a sub-cm focus of signal abnormality within the right cerebellar hemisphere, possibly reflecting a chronic microhemorrhage or cavernoma. -- PET scan on 04/21/23 demonstrated hypermetabolism associated with the  centrally cavitary focal soft tissue mass in the lateral right upper lobe extending out to the pleura, measuring 8.2 x 2.6 cm (previously 8.3 x 3.0 cm) consistent with the known neoplasm; an associated area of irregularity and uptake along the lateral aspect of the right third rib adjacent to the tumor, likely representing tumor involvement; a separate area of uptake separately in the posterior aspect of the right third rib consistent with additional area of potential disease; low level uptake associated with a nonspecific borderline enlarged solitary right paratracheal node with minimal uptake above blood pool; and a nonspecific focus of uptake along a nodule in the subcutaneous fat posteriorly along the pelvis on both sides  After undergoing further staging work-up, she was seen by Dr. Marguerita Shih who recommended proceeding with concurrent chemoradiation with weekly carboplatin  and paclitaxel . She agreed with this recommendation and received her first dose of chemotherapy on 05/08/23. She tolerated systemic therapy quite well overall other than chills/cold intolerance. She received her third and final cycle of chemotherapy on 05/22/23. (A 4th cycle was considered however Dr. Marguerita Shih advised ending her regimen since she already completed radiation).   She most recently presented for a restaging chest CT on 06/15/23 which demonstrated an interval decrease in size of the right apical mass/pleural thickening, measuring 4.1 x 2.5 cm (previously measuring 8.2 x 2.6 cm), compatible with a treatment response. Findings also included radiation changes in this area, as well as stability of the irregular area to the right lateral 3rd rib (which again possibly represents a site of tumor involvement; however post-treatment changes are also a differential consideration).   She was  seen by Dr. Marguerita Shih earlier today to review CT findings. Maintenance therapy was also discussed at that time and she has agreed to proceed with  maintenance immunotherapy with Imfinzi (Durvalumab) to enhance her immune response and prevent disease recurrence.  Patient reports to be doing well overall today. She feels as though the right-sided chest pain she experienced prior to her treatment has improved. It is now present only intermittently. She denies any changes to her breathing since we last saw her.   Allergies:  is allergic to crestor  [rosuvastatin  calcium ], prilosec [omeprazole ], miconazole nitrate, augmentin  [amoxicillin -pot clavulanate], and doxycycline .  Meds: Current Outpatient Medications  Medication Sig Dispense Refill   aspirin  81 MG EC tablet Take 81 mg by mouth daily.      atenolol  (TENORMIN ) 25 MG tablet Take 0.5 tablets (12.5 mg total) by mouth daily. 90 tablet 0   Blood Pressure Monitoring (OMRON 3 SERIES BP MONITOR) DEVI Use as directed. 1 each 0   Foot Care Products ([employer] GEL HEEL CUSHION WOMENS) PADS Use as directed at bedtime 2 each 5   gabapentin  (NEURONTIN ) 300 MG capsule Take 1-2 tab by mouth at bedtime for pain and sleep (Patient taking differently: Take 300 mg by mouth as needed. Take 1-2 tab by mouth at bedtime for pain and sleep) 180 capsule 1   lidocaine -prilocaine  (EMLA ) cream Apply 1 Application topically as needed. 30 g 2   oxyCODONE  (OXY IR/ROXICODONE ) 5 MG immediate release tablet Take 1-2 tablets (5-10 mg total) by mouth every 4 (four) hours as needed for severe pain (pain score 7-10). 60 tablet 0   prochlorperazine  (COMPAZINE ) 10 MG tablet Take 1 tablet (10 mg total) by mouth every 6 (six) hours as needed. 30 tablet 2   temazepam  (RESTORIL ) 15 MG capsule Take 1 capsule (15 mg total) by mouth at bedtime as needed for sleep. 30 capsule 2   Triamcinolone  Acetonide (ZILRETTA ) 32 MG SRER intra-articular injection 5 mL, intra-articular, bilat knees 2 each 0   levothyroxine  (SYNTHROID ) 100 MCG tablet Take 1 tablet (100 mcg total) by mouth daily before breakfast. 30 tablet 0   No current  facility-administered medications for this encounter.    Physical Findings: The patient is in no acute distress. Patient is alert and oriented.  height is 5\' 6"  (1.676 m) and weight is 165 lb (74.8 kg). Her temperature is 97.6 F (36.4 C). Her blood pressure is 106/60 and her pulse is 74. Her respiration is 18 and oxygen saturation is 100%. .  No significant changes. Lungs are clear to auscultation bilaterally. Heart has regular rate and rhythm. No palpable cervical, supraclavicular, or axillary adenopathy. Abdomen soft, non-tender.   Lab Findings: Lab Results  Component Value Date   WBC 5.4 06/15/2023   HGB 10.9 (L) 06/15/2023   HCT 33.9 (L) 06/15/2023   MCV 87.4 06/15/2023   PLT 245 06/15/2023    Radiographic Findings: DG Knee 1-2 Views Right Result Date: 06/23/2023 CLINICAL DATA:  BILATERAL knee pain EXAM: LEFT KNEE - 1-2 VIEW; RIGHT KNEE - 1-2 VIEW COMPARISON:  November 21, 2019 FINDINGS: No acute fracture or dislocation. Severe RIGHT greater than LEFT tricompartmental osteoarthritis with complete joint space loss, bone-on-bone apposition, osseous remodeling and multiple intra-articular loose bodies. 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 Degenerative changes are worst in the LATERAL compartment bilaterally. Trace effusion, likely reactive. Severe vascular atherosclerotic calcifications. IMPRESSION: RIGHT greater than LEFT severe tricompartmental osteoarthritis bone on bone apposition and multiple intra-articular loose bodies. Electronically Signed   By:  Shannan Dart M.D.   On: 06/23/2023 09:28   DG Knee 1-2 Views Left Result Date: 06/23/2023 CLINICAL DATA:  BILATERAL knee pain EXAM: LEFT KNEE - 1-2 VIEW; RIGHT KNEE - 1-2 VIEW COMPARISON:  November 21, 2019 FINDINGS: No acute fracture or dislocation. Severe RIGHT greater than LEFT tricompartmental osteoarthritis with complete joint space loss, bone-on-bone apposition, osseous remodeling and multiple intra-articular  loose bodies. 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 Degenerative changes are worst in the LATERAL compartment bilaterally. Trace effusion, likely reactive. Severe vascular atherosclerotic calcifications. IMPRESSION: RIGHT greater than LEFT severe tricompartmental osteoarthritis bone on bone apposition and multiple intra-articular loose bodies. Electronically Signed   By: Shannan Dart M.D.   On: 06/23/2023 09:28   IR Radiologist Eval & Mgmt Result Date: 06/21/2023 EXAM: NEW PATIENT OFFICE VISIT CHIEF COMPLAINT: See Epic note. HISTORY OF PRESENT ILLNESS: See Epic note. REVIEW OF SYSTEMS: See Epic note. PHYSICAL EXAMINATION: See Epic note. ASSESSMENT AND PLAN: See Epic note. Creasie Doctor, MD [facility] Electronically Signed   By: Creasie Doctor M.D.   On: 06/21/2023 15:59   CT Chest W Contrast Result Date: 06/19/2023 EXAM: CT CHEST WITH CONTRAST 06/15/2023 12:26:37 PM TECHNIQUE: CT of the chest was performed with the administration of intravenous contrast. Multiplanar reformatted images are provided for review. Automated exposure control, iterative reconstruction, and/or weight based adjustment of the mA/kV was utilized to reduce the radiation dose to as low as reasonably achievable. COMPARISON: PET CT dated 04/21/2023 and CT chest dated 03/20/2023. CLINICAL HISTORY: Non-small cell lung cancer (NSCLC), non-metastatic, assess treatment response. Primary squamous cell carcinoma of upper lobe of right lung. FINDINGS: MEDIASTINUM: Heart and pericardium are unremarkable. The central airways are clear. Moderate 3-vessel coronary atherosclerosis. LYMPH NODES: No mediastinal, hilar or axillary lymphadenopathy. LUNGS AND PLEURA: Lateral right apical pleural thickening with underlying 4.1 x 2.5 cm thin-walled cavitary mass extending to the pleural surface/chest wall, corresponding to the patient's known primary bronchogenic carcinoma with  radiation changes. No focal consolidation or pulmonary edema. No pleural effusion or pneumothorax. SOFT TISSUES/BONES: Cortical irregularity of the right lateral third rib, suggesting tumor involvement versus posttreatment changes. Mild anterior wedging at T10 with thoracolumbar spine fixation and hardware extending from T4 through L1. Degenerative changes of the visualized thoracolumbar spine. UPPER ABDOMEN: 2.8 cm left hepatic cyst, benign. IMPRESSION: 1. Improving right apical mass/pleural thickening, corresponding to the patient's known primary bronchogenic carcinoma, with radiation changes. 2. Stable cortical irregularity of the right lateral third rib, suggesting tumor involvement versus posttreatment changes. Electronically signed by: Zadie Herter MD 06/19/2023 02:04 AM EDT RP Workstation: [id number]   US  LIMITED JOINT SPACE STRUCTURES LOW BILAT(NO LINKED CHARGES) Result Date: 06/16/2023 Zilretta  injection right knee Procedure: Real-time Ultrasound Guided Injection of right knee joint superior lateral patellar space Device: Philips Affiniti 50G Images permanently stored and available for review in PACS Verbal informed consent obtained.  Discussed risks and benefits of procedure. Warned about infection, hyperglycemia bleeding, damage to structures among others. Patient expresses understanding and agreement Time-out conducted.  Noted no overlying erythema, induration, or other signs of local infection.  Skin prepped in a sterile fashion.  Local anesthesia: Topical Ethyl chloride.  With sterile technique and under real time ultrasound guidance: Zilretta  32 mg injected into knee joint. Fluid seen entering the joint capsule.  Completed without difficulty  Advised to call if fevers/chills, erythema, induration, drainage, or persistent bleeding.  Images permanently stored and available for review in the ultrasound unit. Impression:  Technically successful ultrasound guided injection.  Procedure: Real-time  Ultrasound Guided Injection of left knee joint superior lateral patellar space Device: Philips Affiniti 50G Images permanently stored and available for review in PACS Verbal informed consent obtained.  Discussed risks and benefits of procedure. Warned about infection, hyperglycemia bleeding, damage to structures among others. Patient expresses understanding and agreement Time-out conducted.  Noted no overlying erythema, induration, or other signs of local infection.  Skin prepped in a sterile fashion.  Local anesthesia: Topical Ethyl chloride.  With sterile technique and under real time ultrasound guidance: Zilretta  32 mg injected into knee joint. Fluid seen entering the joint capsule.  Completed without difficulty  Advised to call if fevers/chills, erythema, induration, drainage, or persistent bleeding.  Images permanently stored and available for review in the ultrasound unit. Impression: Technically successful ultrasound guided injection.  Lot number: [id number] for both injections     Impression: Stage IIIB (T4, N2b, M0) non-small cell lung cancer, squamous cell carcinoma of the right upper lung- .presented with a large pleural-based peripheral right upper lobe lung mass measuring up to 8.2 cm in size with large right paratracheal node with low level hypermetabolic uptake diagnosed in March 2025 : s/p concurrent chemoradiation now on maintenance immunotherapy   The patient has healed well from her radiation treatment. She fortunately experienced a reduction in pain from her treatment. She will proceed with immunotherapy under the care of Dr. Marguerita Shih. Radiation follow-up PRN. We appreciate the opportunity to take part in this patient's care. She was encouraged to call our office with any questions or concerns.     20 minutes of total time was spent for this patient encounter, including preparation, face-to-face counseling with the patient and coordination of care, physical exam, and documentation of the  encounter. ____________________________________   Julio Ohm, PA-C   Noralee Beam, PhD, MD   [facility]  Radiation Oncology Direct Dial: [phone]  Fax: [phone] [email or web address]    This document serves as a record of services personally performed by Retta Caster, MD and Julio Ohm, PA-C. It was created on their behalf by Aleta Anda, a trained medical scribe. The creation of this record is based on the scribe's personal observations and the provider's statements to them. This document has been checked and approved by the attending provider.

## 2023-06-27 ENCOUNTER — Encounter: Payer: Self-pay | Admitting: Internal Medicine

## 2023-06-27 ENCOUNTER — Telehealth: Payer: Self-pay | Admitting: Internal Medicine

## 2023-06-27 NOTE — Progress Notes (Signed)
 [facility] Healthcare Advance Directives Clinical Social Work  Patient presented to Advance Directives Clinic  to review and complete healthcare advance directives.  Clinical Social Worker met with patient and daughter.  The patient designated [name] [phone]) as their primary healthcare agent.  Patient also completed healthcare living will.    Documents were notarized and copies made for patient/family. Clinical Social Worker will send documents to medical records to be scanned into patient's chart. Clinical Social Worker encouraged patient/family to contact with any additional questions or concerns.   Quenton Bruns, LCSW Clinical Social Worker [facility]

## 2023-06-27 NOTE — Telephone Encounter (Signed)
 Left a voicemail with scheduled appointment details.

## 2023-06-28 ENCOUNTER — Other Ambulatory Visit: Payer: Self-pay

## 2023-06-30 ENCOUNTER — Ambulatory Visit: Payer: Self-pay | Admitting: Internal Medicine

## 2023-06-30 ENCOUNTER — Other Ambulatory Visit: Payer: Self-pay | Admitting: Internal Medicine

## 2023-06-30 MED ORDER — ALENDRONATE SODIUM 70 MG PO TABS: 70.0000 mg | ORAL_TABLET | ORAL | 3 refills | Status: DC

## 2023-07-03 ENCOUNTER — Inpatient Hospital Stay

## 2023-07-03 ENCOUNTER — Telehealth: Payer: Self-pay | Admitting: Medical Oncology

## 2023-07-03 VITALS — BP 103/59 | HR 77 | Temp 97.5°F | Resp 18 | Wt 169.8 lb

## 2023-07-03 DIAGNOSIS — R5383 Other fatigue: Secondary | ICD-10-CM | POA: Diagnosis not present

## 2023-07-03 DIAGNOSIS — G479 Sleep disorder, unspecified: Secondary | ICD-10-CM | POA: Diagnosis not present

## 2023-07-03 DIAGNOSIS — G893 Neoplasm related pain (acute) (chronic): Secondary | ICD-10-CM | POA: Diagnosis not present

## 2023-07-03 DIAGNOSIS — Z5112 Encounter for antineoplastic immunotherapy: Secondary | ICD-10-CM | POA: Diagnosis not present

## 2023-07-03 DIAGNOSIS — Z79620 Long term (current) use of immunosuppressive biologic: Secondary | ICD-10-CM | POA: Diagnosis not present

## 2023-07-03 DIAGNOSIS — K5900 Constipation, unspecified: Secondary | ICD-10-CM | POA: Diagnosis not present

## 2023-07-03 DIAGNOSIS — Z515 Encounter for palliative care: Secondary | ICD-10-CM | POA: Diagnosis not present

## 2023-07-03 DIAGNOSIS — Z95828 Presence of other vascular implants and grafts: Secondary | ICD-10-CM

## 2023-07-03 DIAGNOSIS — C3411 Malignant neoplasm of upper lobe, right bronchus or lung: Secondary | ICD-10-CM

## 2023-07-03 DIAGNOSIS — R0781 Pleurodynia: Secondary | ICD-10-CM | POA: Diagnosis not present

## 2023-07-03 LAB — CBC WITH DIFFERENTIAL (CANCER CENTER ONLY)
Abs Immature Granulocytes: 0.03 10*3/uL (ref 0.00–0.07)
Basophils Absolute: 0 10*3/uL (ref 0.0–0.1)
Basophils Relative: 0 %
Eosinophils Absolute: 0.1 10*3/uL (ref 0.0–0.5)
Eosinophils Relative: 1 %
HCT: 35.2 % — ABNORMAL LOW (ref 36.0–46.0)
Hemoglobin: 11.4 g/dL — ABNORMAL LOW (ref 12.0–15.0)
Immature Granulocytes: 1 %
Lymphocytes Relative: 18 %
Lymphs Abs: 0.9 10*3/uL (ref 0.7–4.0)
MCH: 29.5 pg (ref 26.0–34.0)
MCHC: 32.4 g/dL (ref 30.0–36.0)
MCV: 91.2 fL (ref 80.0–100.0)
Monocytes Absolute: 0.4 10*3/uL (ref 0.1–1.0)
Monocytes Relative: 7 %
Neutro Abs: 3.6 10*3/uL (ref 1.7–7.7)
Neutrophils Relative %: 73 %
Platelet Count: 216 10*3/uL (ref 150–400)
RBC: 3.86 MIL/uL — ABNORMAL LOW (ref 3.87–5.11)
RDW: 21.7 % — ABNORMAL HIGH (ref 11.5–15.5)
WBC Count: 5 10*3/uL (ref 4.0–10.5)
nRBC: 0 % (ref 0.0–0.2)

## 2023-07-03 LAB — CMP (CANCER CENTER ONLY)
ALT: 7 U/L (ref 0–44)
AST: 11 U/L — ABNORMAL LOW (ref 15–41)
Albumin: 3.4 g/dL — ABNORMAL LOW (ref 3.5–5.0)
Alkaline Phosphatase: 89 U/L (ref 38–126)
Anion gap: 8 (ref 5–15)
BUN: 6 mg/dL — ABNORMAL LOW (ref 8–23)
CO2: 29 mmol/L (ref 22–32)
Calcium: 8 mg/dL — ABNORMAL LOW (ref 8.9–10.3)
Chloride: 101 mmol/L (ref 98–111)
Creatinine: 0.6 mg/dL (ref 0.44–1.00)
GFR, Estimated: >60 mL/min (ref >60)
Glucose, Bld: 108 mg/dL — ABNORMAL HIGH (ref 70–99)
Potassium: 3.3 mmol/L — ABNORMAL LOW (ref 3.5–5.1)
Sodium: 138 mmol/L (ref 135–145)
Total Bilirubin: 0.6 mg/dL (ref 0.0–1.2)
Total Protein: 7 g/dL (ref 6.5–8.1)

## 2023-07-03 LAB — TSH: TSH: 3.91 u[IU]/mL (ref 0.350–4.500)

## 2023-07-03 MED ORDER — HEPARIN SOD (PORK) LOCK FLUSH 100 UNIT/ML IV SOLN: 500.0000 [IU] | Freq: Once | INTRAVENOUS | Status: DC | PRN

## 2023-07-03 MED ORDER — SODIUM CHLORIDE 0.9 % IV SOLN: INTRAVENOUS | Status: DC

## 2023-07-03 MED ORDER — SODIUM CHLORIDE 0.9% FLUSH: 10.0000 mL | INTRAVENOUS | Status: DC | PRN

## 2023-07-03 MED ORDER — SODIUM CHLORIDE 0.9 % IV SOLN
1500.0000 mg | Freq: Once | INTRAVENOUS | Status: AC
  Administered 2023-07-03: 1500 mg via INTRAVENOUS
  Filled 2023-07-03: qty 30

## 2023-07-03 MED ORDER — SODIUM CHLORIDE 0.9% FLUSH
10.0000 mL | Freq: Once | INTRAVENOUS | Status: AC
  Administered 2023-07-03: 10 mL

## 2023-07-03 NOTE — Patient Instructions (Signed)
 [facility] - A DEPT OF [facility]  Discharge Instructions: Thank you for choosing [facility] to provide your oncology and hematology care.   If you have a lab appointment with the Cancer Center, please go directly to the Cancer Center and check in at the registration area.   Wear comfortable clothing and clothing appropriate for easy access to any Portacath or PICC line.   We strive to give you quality time with your provider. You may need to reschedule your appointment if you arrive late (15 or more minutes).  Arriving late affects you and other patients whose appointments are after yours.  Also, if you miss three or more appointments without notifying the office, you may be dismissed from the clinic at the provider's discretion.      For prescription refill requests, have your pharmacy contact our office and allow 72 hours for refills to be completed.    Today you received the following chemotherapy and/or immunotherapy agents: Imfinzi     To help prevent nausea and vomiting after your treatment, we encourage you to take your nausea medication as directed.  BELOW ARE SYMPTOMS THAT SHOULD BE REPORTED IMMEDIATELY: *FEVER GREATER THAN 100.4 F (38 C) OR HIGHER *CHILLS OR SWEATING *NAUSEA AND VOMITING THAT IS NOT CONTROLLED WITH YOUR NAUSEA MEDICATION *UNUSUAL SHORTNESS OF BREATH *UNUSUAL BRUISING OR BLEEDING *URINARY PROBLEMS (pain or burning when urinating, or frequent urination) *BOWEL PROBLEMS (unusual diarrhea, constipation, pain near the anus) TENDERNESS IN MOUTH AND THROAT WITH OR WITHOUT PRESENCE OF ULCERS (sore throat, sores in mouth, or a toothache) UNUSUAL RASH, SWELLING OR PAIN  UNUSUAL VAGINAL DISCHARGE OR ITCHING   Items with * indicate a potential emergency and should be followed up as soon as possible or go to the Emergency Department if any problems should occur.  Please show the CHEMOTHERAPY ALERT CARD or IMMUNOTHERAPY  ALERT CARD at check-in to the Emergency Department and triage nurse.  Should you have questions after your visit or need to cancel or reschedule your appointment, please contact [facility] - A DEPT OF [facility]  Dept: [phone]  and follow the prompts.  Office hours are 8:00 a.m. to 4:30 p.m. Monday - Friday. Please note that voicemails left after 4:00 p.m. may not be returned until the following business day.  We are closed weekends and major holidays. You have access to a nurse at all times for urgent questions. Please call the main number to the clinic Dept: [phone] and follow the prompts.   For any non-urgent questions, you may also contact your provider using MyChart. We now offer e-Visits for anyone 51 and older to request care online for non-urgent symptoms. For details visit [email or web address].   Also download the MyChart app! Go to the app store, search MyChart, open the app, select [facility], and log in with your MyChart username and password.  Paclitaxel  Injection What is this medication? PACLITAXEL  (PAK li TAX el) treats some types of cancer. It works by slowing down the growth of cancer cells. This medicine may be used for other purposes; ask your health care provider or pharmacist if you have questions. COMMON BRAND NAME(S): Onxol, Taxol  What should I tell my care team before I take this medication? They need to know if you have any of these conditions: Heart disease Liver disease Low white blood cell levels An unusual or allergic reaction to paclitaxel , other medications, foods, dyes,  or preservatives If you or your partner are pregnant or trying to get pregnant Breast-feeding How should I use this medication? This medication is injected into a vein. It is given by your care team in a hospital or clinic setting. Talk to your care team about the use of this medication in children. While it may be given to children for selected  conditions, precautions do apply. Overdosage: If you think you have taken too much of this medicine contact a poison control center or emergency room at once. NOTE: This medicine is only for you. Do not share this medicine with others. What if I miss a dose? Keep appointments for follow-up doses. It is important not to miss your dose. Call your care team if you are unable to keep an appointment. What may interact with this medication? Do not take this medication with any of the following: Live virus vaccines Other medications may affect the way this medication works. Talk with your care team about all of the medications you take. They may suggest changes to your treatment plan to lower the risk of side effects and to make sure your medications work as intended. This list may not describe all possible interactions. Give your health care provider a list of all the medicines, herbs, non-prescription drugs, or dietary supplements you use. Also tell them if you smoke, drink alcohol, or use illegal drugs. Some items may interact with your medicine. What should I watch for while using this medication? Your condition will be monitored carefully while you are receiving this medication. You may need blood work while taking this medication. This medication may make you feel generally unwell. This is not uncommon as chemotherapy can affect healthy cells as well as cancer cells. Report any side effects. Continue your course of treatment even though you feel ill unless your care team tells you to stop. This medication can cause serious allergic reactions. To reduce the risk, your care team may give you other medications to take before receiving this one. Be sure to follow the directions from your care team. This medication may increase your risk of getting an infection. Call your care team for advice if you get a fever, chills, sore throat, or other symptoms of a cold or flu. Do not treat yourself. Try to avoid  being around people who are sick. This medication may increase your risk to bruise or bleed. Call your care team if you notice any unusual bleeding. Be careful brushing or flossing your teeth or using a toothpick because you may get an infection or bleed more easily. If you have any dental work done, tell your dentist you are receiving this medication. Talk to your care team if you may be pregnant. Serious birth defects can occur if you take this medication during pregnancy. Talk to your care team before breastfeeding. Changes to your treatment plan may be needed. What side effects may I notice from receiving this medication? Side effects that you should report to your care team as soon as possible: Allergic reactions--skin rash, itching, hives, swelling of the face, lips, tongue, or throat Heart rhythm changes--fast or irregular heartbeat, dizziness, feeling faint or lightheaded, chest pain, trouble breathing Increase in blood pressure Infection--fever, chills, cough, sore throat, wounds that don't heal, pain or trouble when passing urine, general feeling of discomfort or being unwell Low blood pressure--dizziness, feeling faint or lightheaded, blurry vision Low red blood cell level--unusual weakness or fatigue, dizziness, headache, trouble breathing Painful swelling, warmth, or redness of the  skin, blisters or sores at the infusion site Pain, tingling, or numbness in the hands or feet Slow heartbeat--dizziness, feeling faint or lightheaded, confusion, trouble breathing, unusual weakness or fatigue Unusual bruising or bleeding Side effects that usually do not require medical attention (report to your care team if they continue or are bothersome): Diarrhea Hair loss Joint pain Loss of appetite Muscle pain Nausea Vomiting This list may not describe all possible side effects. Call your doctor for medical advice about side effects. You may report side effects to FDA at 1-800-FDA-1088. Where  should I keep my medication? This medication is given in a hospital or clinic. It will not be stored at home. NOTE: This sheet is a summary. It may not cover all possible information. If you have questions about this medicine, talk to your doctor, pharmacist, or health care provider.  2024 Elsevier/Gold Standard (2021-05-25 00:00:00)  Carboplatin  Injection What is this medication? CARBOPLATIN  (KAR boe pla tin) treats some types of cancer. It works by slowing down the growth of cancer cells. This medicine may be used for other purposes; ask your health care provider or pharmacist if you have questions. COMMON BRAND NAME(S): Paraplatin  What should I tell my care team before I take this medication? They need to know if you have any of these conditions: Blood disorders Hearing problems Kidney disease Recent or ongoing radiation therapy An unusual or allergic reaction to carboplatin , cisplatin, other medications, foods, dyes, or preservatives Pregnant or trying to get pregnant Breast-feeding How should I use this medication? This medication is injected into a vein. It is given by your care team in a hospital or clinic setting. Talk to your care team about the use of this medication in children. Special care may be needed. Overdosage: If you think you have taken too much of this medicine contact a poison control center or emergency room at once. NOTE: This medicine is only for you. Do not share this medicine with others. What if I miss a dose? Keep appointments for follow-up doses. It is important not to miss your dose. Call your care team if you are unable to keep an appointment. What may interact with this medication? Medications for seizures Some antibiotics, such as amikacin, gentamicin, neomycin, streptomycin, tobramycin Vaccines This list may not describe all possible interactions. Give your health care provider a list of all the medicines, herbs, non-prescription drugs, or dietary  supplements you use. Also tell them if you smoke, drink alcohol, or use illegal drugs. Some items may interact with your medicine. What should I watch for while using this medication? Your condition will be monitored carefully while you are receiving this medication. You may need blood work while taking this medication. This medication may make you feel generally unwell. This is not uncommon, as chemotherapy can affect healthy cells as well as cancer cells. Report any side effects. Continue your course of treatment even though you feel ill unless your care team tells you to stop. In some cases, you may be given additional medications to help with side effects. Follow all directions for their use. This medication may increase your risk of getting an infection. Call your care team for advice if you get a fever, chills, sore throat, or other symptoms of a cold or flu. Do not treat yourself. Try to avoid being around people who are sick. Avoid taking medications that contain aspirin , acetaminophen , ibuprofen , naproxen , or ketoprofen unless instructed by your care team. These medications may hide a fever. Be careful brushing or  flossing your teeth or using a toothpick because you may get an infection or bleed more easily. If you have any dental work done, tell your dentist you are receiving this medication. Talk to your care team if you wish to become pregnant or think you might be pregnant. This medication can cause serious birth defects. Talk to your care team about effective forms of contraception. Do not breast-feed while taking this medication. What side effects may I notice from receiving this medication? Side effects that you should report to your care team as soon as possible: Allergic reactions--skin rash, itching, hives, swelling of the face, lips, tongue, or throat Infection--fever, chills, cough, sore throat, wounds that don't heal, pain or trouble when passing urine, general feeling of  discomfort or being unwell Low red blood cell level--unusual weakness or fatigue, dizziness, headache, trouble breathing Pain, tingling, or numbness in the hands or feet, muscle weakness, change in vision, confusion or trouble speaking, loss of balance or coordination, trouble walking, seizures Unusual bruising or bleeding Side effects that usually do not require medical attention (report to your care team if they continue or are bothersome): Hair loss Nausea Unusual weakness or fatigue Vomiting This list may not describe all possible side effects. Call your doctor for medical advice about side effects. You may report side effects to FDA at 1-800-FDA-1088. Where should I keep my medication? This medication is given in a hospital or clinic. It will not be stored at home. NOTE: This sheet is a summary. It may not cover all possible information. If you have questions about this medicine, talk to your doctor, pharmacist, or health care provider.  2024 Elsevier/Gold Standard (2021-04-27 00:00:00)

## 2023-07-03 NOTE — Telephone Encounter (Signed)
 Pt called and said she missed a call. Pt is here today .

## 2023-07-04 LAB — T4: T4, Total: 9.2 ug/dL (ref 4.5–12.0)

## 2023-07-06 ENCOUNTER — Telehealth: Payer: Self-pay | Admitting: Internal Medicine

## 2023-07-06 MED ORDER — LEVOTHYROXINE SODIUM 100 MCG PO TABS: 100.0000 ug | ORAL_TABLET | Freq: Every day | ORAL | 3 refills | Status: DC

## 2023-07-06 NOTE — Addendum Note (Signed)
 Addended by: Roslyn Coombe on: 07/06/2023 07:58 PM   Modules accepted: Orders

## 2023-07-06 NOTE — Telephone Encounter (Signed)
 Ok this is done

## 2023-07-06 NOTE — Telephone Encounter (Signed)
 Copied from CRM [id number]. Topic: Clinical - Medication Question >> Jul 06, 2023  3:12 PM [place] A wrote: Reason for CRM: Patient wants Nurse to call her -Patient said Dr.John needs to put her back on the dosage of levothyroxine  (SYNTHROID ) 100 MCG tablet that she was on-please call

## 2023-07-10 ENCOUNTER — Encounter: Payer: Self-pay | Admitting: Nurse Practitioner

## 2023-07-10 ENCOUNTER — Inpatient Hospital Stay (HOSPITAL_BASED_OUTPATIENT_CLINIC_OR_DEPARTMENT_OTHER): Admitting: Nurse Practitioner

## 2023-07-10 ENCOUNTER — Other Ambulatory Visit: Payer: Self-pay | Admitting: Interventional Radiology

## 2023-07-10 DIAGNOSIS — M25561 Pain in right knee: Secondary | ICD-10-CM

## 2023-07-10 DIAGNOSIS — K5900 Constipation, unspecified: Secondary | ICD-10-CM | POA: Diagnosis not present

## 2023-07-10 DIAGNOSIS — Z515 Encounter for palliative care: Secondary | ICD-10-CM | POA: Diagnosis not present

## 2023-07-10 DIAGNOSIS — G893 Neoplasm related pain (acute) (chronic): Secondary | ICD-10-CM | POA: Diagnosis not present

## 2023-07-10 DIAGNOSIS — C3411 Malignant neoplasm of upper lobe, right bronchus or lung: Secondary | ICD-10-CM

## 2023-07-10 DIAGNOSIS — M1711 Unilateral primary osteoarthritis, right knee: Secondary | ICD-10-CM

## 2023-07-10 MED ORDER — OXYCODONE HCL 5 MG PO TABS: 5.0000 mg | ORAL_TABLET | ORAL | 0 refills | Status: DC | PRN

## 2023-07-10 NOTE — Telephone Encounter (Signed)
 Called and let Pt know

## 2023-07-11 ENCOUNTER — Encounter: Payer: Self-pay | Admitting: Internal Medicine

## 2023-07-11 NOTE — Progress Notes (Signed)
 Palliative Medicine [facility]  [phone] [phone]   Name: [name] Date: 07/11/2023 MRN: [id number]  DOB: [birth year]  Patient Care Team: [name], MD as PCP - Diedre Wonda Sharper, MD as PCP - Cardiology (Cardiology) Tobie Tonita POUR, DO as Consulting Physician (Neurology) Lelon Glendia ONEIDA DEVONNA as Physician Assistant (Cardiology) Joane Artist RAMAN, MD as Consulting Physician (Family Medicine) Prentis Duwaine BROCKS, RN as Oncology Nurse Navigator Pickenpack-Cousar, Fannie SAILOR, NP as Nurse Practitioner [facility] and Palliative Medicine)   I connected with [name] on 07/10/23 at  4:15 PM EDT by Telephone and verified that I am speaking with the correct person using two identifiers.   I discussed the limitations, risks, security and privacy concerns of performing an evaluation and management service by telemedicine and the availability of in-person appointments. I also discussed with the patient that there may be a patient responsible charge related to this service. The patient expressed understanding and agreed to proceed.   Other persons participating in the visit and their role in the encounter: Brandi, [name]    Patient's location: Home   Provider's location: [facility]   INTERVAL HISTORY: [name] is a 78 y.o. female with  oncologic medical history including stage IIIB non-small cell lung cancer (03/2023) s/p concurrent chemoradiation with plans for immunotherapy starting 07/03/23.  Palliative is seeing patient for symptom management and goals of care.   SOCIAL HISTORY:     reports that she quit smoking about 37 years ago. Her smoking use included cigarettes. She started smoking about 67 years ago. She has never used smokeless tobacco. She reports that she does not drink alcohol and does not use drugs.  ADVANCE DIRECTIVES:  Completed on 06/26/23. Brandi [name] is designated HCPOA.   CODE STATUS: DNR  PAST MEDICAL  HISTORY: Past Medical History:  Diagnosis Date   AAA (abdominal aortic aneurysm) (HCC)    a. s/p stent graft repair 01/2016.   Acute ischemic colitis (HCC) 07/29/2010   Diagnosed June, 2012 characterized by acute lower GI bleeding, spontaneously resolved.    Acute respiratory infection 06/09/2014   Acute sinus infection 05/14/2015   Allergic rhinitis, cause unspecified    Angioedema of lips 06/03/2014   Anxiety state, unspecified    Arthritis of left hip 04/16/2015   Bilateral hearing loss 07/19/2017   Chronic LBP    Coronary artery disease    a. inferior MI 1998 s/p PCI of RCA. b. stenting of Cx 04/2010. c. DES to prox LAD 05/2013. d. DES x 3 in 10/2015. // Myoview  07/2018:  EF 59, normal perfusion, low risk    Coronary artery disease involving native heart without angina pectoris 07/31/2006   Qualifier: Diagnosis of  By: Harlow MD, Sharper BRAVO  ANGIOGRAPHIC DATA:  1. Ventriculography done in the RAO projection reveals a small wall  motion abnormality in the mid inferior wall. Ejection fraction  would be estimated at around 50%.  2. The right coronary artery has some progressive disease of about 60-  80% in the proximal mid segment. The distal vessel appears to be  50-70% narrowing althou   Degeneration of lumbar or lumbosacral intervertebral disc    Depressive disorder, not elsewhere classified    Diabetes (HCC) 11/03/2006   Qualifier: Diagnosis of  By: [name] MD, Lynwood ORN    Diabetes mellitus    TYPE II   Frequent urination 05/08/2014   Gout 08/22/2013   History of endovascular stent graft for abdominal  aortic aneurysm (AAA) 01/22/2016   History of radiation therapy    Right lung-04/19/23-05/29/23-Dr. [name]   Hyperlipidemia    Hypertension    Hypothyroidism    Iron  deficiency anemia 06/13/2017   Lower GI bleed 06/2010   Diverticular bleed   Lumbar radiculopathy 05/15/2015   MENOPAUSAL DISORDER 10/29/2007   Qualifier: Diagnosis of  By: [name] MD, Lynwood ORN    Myalgia 09/25/2013    Noncompliance with medications 02/26/2014   Obesity, unspecified    OTITIS MEDIA, SEROUS, CHRONIC 04/23/2007   Qualifier: Diagnosis of  By: Harlow MD, Ozell BRAVO    Thoracic disc disease 11/19/2018   URTICARIA 09/23/2009   Qualifier: Diagnosis of  By: Inocencio MD, Berwyn A    Venous insufficiency 07/19/2017    ALLERGIES:  is allergic to crestor  [rosuvastatin  calcium ], prilosec [omeprazole ], miconazole nitrate, augmentin  [amoxicillin -pot clavulanate], and doxycycline .  MEDICATIONS:  Current Outpatient Medications  Medication Sig Dispense Refill   alendronate  (FOSAMAX ) 70 MG tablet Take 1 tablet (70 mg total) by mouth every 7 (seven) days. Take with a full glass of water on an empty stomach. 12 tablet 3   aspirin  81 MG EC tablet Take 81 mg by mouth daily.      atenolol  (TENORMIN ) 25 MG tablet Take 0.5 tablets (12.5 mg total) by mouth daily. 90 tablet 0   Blood Pressure Monitoring (OMRON 3 SERIES BP MONITOR) DEVI Use as directed. 1 each 0   Foot Care Products ([employer] GEL HEEL CUSHION WOMENS) PADS Use as directed at bedtime 2 each 5   gabapentin  (NEURONTIN ) 300 MG capsule Take 1-2 tab by mouth at bedtime for pain and sleep (Patient taking differently: Take 300 mg by mouth as needed. Take 1-2 tab by mouth at bedtime for pain and sleep) 180 capsule 1   levothyroxine  (SYNTHROID ) 100 MCG tablet Take 1 tablet (100 mcg total) by mouth daily before breakfast. 90 tablet 3   lidocaine -prilocaine  (EMLA ) cream Apply 1 Application topically as needed. 30 g 2   oxyCODONE  (OXY IR/ROXICODONE ) 5 MG immediate release tablet Take 1-2 tablets (5-10 mg total) by mouth every 4 (four) hours as needed for severe pain (pain score 7-10). 60 tablet 0   prochlorperazine  (COMPAZINE ) 10 MG tablet Take 1 tablet (10 mg total) by mouth every 6 (six) hours as needed. 30 tablet 2   temazepam  (RESTORIL ) 15 MG capsule Take 1 capsule (15 mg total) by mouth at bedtime as needed for sleep. 30 capsule 2   Triamcinolone  Acetonide  (ZILRETTA ) 32 MG SRER intra-articular injection 5 mL, intra-articular, bilat knees 2 each 0   No current facility-administered medications for this visit.    VITAL SIGNS: There were no vitals taken for this visit. There were no vitals filed for this visit.  Estimated body mass index is 27.4 kg/m as calculated from the following:   Height as of 06/26/23: 5' 6 (1.676 m).   Weight as of 07/03/23: 169 lb 12 oz (77 kg).   PERFORMANCE STATUS (ECOG) : 1 - Symptomatic but completely ambulatory  IMPRESSION: Mrs. Migdalia Longmire is a 78 year old female with non-small cell lung cancer currently undergoing immunotherapy treatments. I connected with patient and her Brandi by phone for symptom management follow-up. No acute distress. Deneis concerns with nausea, vomiting, constipation, or diarrhea. Occasional fatigue. Appetite is fair. Sleeping better with use of temazepam .   We discussed her pain at length. Eveleen and her Brandi reports pain is slowly improving. Some days are better than others. She is taking oxycodone   5mg  as needed. States this controls her pain however on some days pain is more intense. Patient instructed for severe pain she may take 1-2 tablets.   All questions answered and support provided.   Goals of Care 06/26/23: We discussed her current illness and what it means in the larger context of her on-going co-morbidities. Natural disease trajectory and expectations were discussed. Patient and her Brandi are realistic in their understanding of patient's overall condition and cancer state.    I empathetically approached discussions regarding healthcare limitations, code status, and advanced directives. Patient does not have advanced directives however reports interest in completing. Extensive education provided on AD documents. Patient would like to complete at advanced directive clinic today. She expresses wishes to name her Brandi, Tramaine as her medical decision maker in the event  she is unable to speak for herself, desires a natural death with no CPR or life-sustaining measures in a terminal or comatose state. Open to artificial feedings for trial if condition is reversible.    Ms. Jasmer is clear in her expressed wishes to continue to treat the treatable allowing her every opportunity to continue thriving while managing her symptoms to minimize discomfort.   We discussed the patient's current illness and what it means in the larger context of their on-going co-morbidities. Natural disease trajectory and expectations were discussed.  I discussed the importance of continued conversation with family and their medical providers regarding overall plan of care and treatment options, ensuring decisions are within the context of the patients values and GOCs.  Assessment & Plan  Cancer Related Pain management Chronic right-sided pain managed effectively with oxycodone . No adjustments to regimen.  - Continue oxycodone  5 mg, 1-2 tablets as needed every 4-6hrs for pain. - Instruct to contact when down to 2-3 days' supply for refill.   Constipation Intermittent constipation likely due to opioid use. Miralax  recommended for its gentle effect. - Instruct to take Miralax  once daily, increase to twice daily if needed. - Consider alternative if Miralax  is ineffective.   Sleep disturbance Difficulty sleeping at night. Temazepam  will be tried first for sleep issues. - Instruct to try temazepam  15 mg at bedtime for sleep as previously prescribed.    Goals of Care Discussed advanced directives and goals of care. She desires a natural death without life-sustaining measures. - Completed advanced directives paperwork at AD clinic. Documents filed. - Brandi is designated as Midwife.   I will plan to follow-up with patient in 2-3 weeks. Sooner if needed.    Patient expressed understanding and was in agreement with this plan. She also understands that She can call  the clinic at any time with any questions, concerns, or complaints.   Any controlled substances utilized were prescribed in the context of palliative care. PDMP has been reviewed.    Visit consisted of counseling and education dealing with the complex and emotionally intense issues of symptom management and palliative care in the setting of serious and potentially life-threatening illness.  Levon Borer, AGPCNP-BC  Palliative Medicine Team/[facility]

## 2023-07-14 ENCOUNTER — Telehealth: Payer: Self-pay

## 2023-07-14 DIAGNOSIS — G8929 Other chronic pain: Secondary | ICD-10-CM

## 2023-07-14 DIAGNOSIS — M170 Bilateral primary osteoarthritis of knee: Secondary | ICD-10-CM

## 2023-07-14 MED ORDER — METHYLPREDNISOLONE 4 MG PO TBPK: ORAL_TABLET | ORAL | 0 refills | Status: DC

## 2023-07-14 NOTE — Telephone Encounter (Signed)
 Patient was called and reminded about upcoming GAE procedure on 07/17/23 and told that medrol  dosepak was called in to her [facility] in [place]. It is to be taken as prescribed after her GAE procedure. She also was reminded to have a driver, to wear comfortable clothes and shoes and not to eat after midnight.  She may take morning meds with small sip of water.

## 2023-07-14 NOTE — H&P (Signed)
 Chief Complaint: Patient was seen in consultation today for right knee pain.   Referring Physician(s): Joane Artist RAMAN  Supervising Physician: Jennefer Rover  Patient Status: [facility] - Outpatient   History of Present Illness: [name] is a 78 y.o. female with a medical history significant for CAD, obesity, HTN, DM2, anxiety, lower GI bleed, AAA, PVD and recently diagnosed non-small cell lung cancer. She is familiar to IR from a lung mass biopsy 03/27/23 and port-a-catheter placement 05/17/23.     The patient also has a history of bilateral knee pain and she has been followed by Sports Medicine for several years. She has received numerous Zilretta  injections and has previously received steroid injections. Knee injections provide only short term relief and her pain is rated as severe when the effects wear off. She is not a surgical candidate and Dr. Joane referred her to Interventional Radiology to discuss geniculate artery embolization. She met with Dr. Jennefer and reported that she ambulates primarily with a cane. Her right knee bothers her more than her left, but only slightly.  They discussed the rationale, periprocedural expectations, and long term expected outcomes of geniculate artery embolization. She expressed a desire to proceed and they discussed starting with the right knee possibly followed by the left at a later date.    Past Medical History:  Diagnosis Date   AAA (abdominal aortic aneurysm) (HCC)    a. s/p stent graft repair 01/2016.   Acute ischemic colitis (HCC) 07/29/2010   Diagnosed June, 2012 characterized by acute lower GI bleeding, spontaneously resolved.    Acute respiratory infection 06/09/2014   Acute sinus infection 05/14/2015   Allergic rhinitis, cause unspecified    Angioedema of lips 06/03/2014   Anxiety state, unspecified    Arthritis of left hip 04/16/2015   Bilateral hearing loss 07/19/2017   Chronic LBP    Coronary artery disease    a.  inferior MI 1998 s/p PCI of RCA. b. stenting of Cx 04/2010. c. DES to prox LAD 05/2013. d. DES x 3 in 10/2015. // Myoview  07/2018:  EF 59, normal perfusion, low risk    Coronary artery disease involving native heart without angina pectoris 07/31/2006   Qualifier: Diagnosis of  By: Harlow MD, Ozell BRAVO  ANGIOGRAPHIC DATA:  1. Ventriculography done in the RAO projection reveals a small wall  motion abnormality in the mid inferior wall. Ejection fraction  would be estimated at around 50%.  2. The right coronary artery has some progressive disease of about 60-  80% in the proximal mid segment. The distal vessel appears to be  50-70% narrowing althou   Degeneration of lumbar or lumbosacral intervertebral disc    Depressive disorder, not elsewhere classified    Diabetes (HCC) 11/03/2006   Qualifier: Diagnosis of  By: [name] MD, Lynwood ORN    Diabetes mellitus    TYPE II   Frequent urination 05/08/2014   Gout 08/22/2013   History of endovascular stent graft for abdominal aortic aneurysm (AAA) 01/22/2016   History of radiation therapy    Right lung-04/19/23-05/29/23-Dr. [name]   Hyperlipidemia    Hypertension    Hypothyroidism    Iron  deficiency anemia 06/13/2017   Lower GI bleed 06/2010   Diverticular bleed   Lumbar radiculopathy 05/15/2015   MENOPAUSAL DISORDER 10/29/2007   Qualifier: Diagnosis of  By: [name] MD, Lynwood ORN    Myalgia 09/25/2013   Noncompliance with medications 02/26/2014   Obesity, unspecified    OTITIS MEDIA, SEROUS, CHRONIC  04/23/2007   Qualifier: Diagnosis of  By: Harlow MD, Ozell BRAVO    Thoracic disc disease 11/19/2018   URTICARIA 09/23/2009   Qualifier: Diagnosis of  By: Inocencio MD, Berwyn A    Venous insufficiency 07/19/2017    Past Surgical History:  Procedure Laterality Date   CARDIAC CATHETERIZATION     PCI OF BOTH THE CIRCUMFLEX AND LEFT ANTERIOR DESCENDING ARTERY   CARDIAC CATHETERIZATION N/A 10/29/2015   Procedure: Coronary Stent Intervention;  Surgeon:  Lonni Hanson, MD;  Location: [facility] INVASIVE CV LAB;  Service: Cardiovascular;  Laterality: N/A;   CARDIAC CATHETERIZATION N/A 10/29/2015   Procedure: Coronary/Graft Angiography;  Surgeon: Lonni Hanson, MD;  Location: [facility] INVASIVE CV LAB;  Service: Cardiovascular;  Laterality: N/A;   CARDIAC CATHETERIZATION N/A 10/29/2015   Procedure: Intravascular Pressure Wire/FFR Study;  Surgeon: Lonni Hanson, MD;  Location: [facility] INVASIVE CV LAB;  Service: Cardiovascular;  Laterality: N/A;   CESAREAN SECTION     ENDOVASCULAR STENT INSERTION N/A 01/22/2016   Procedure: ABDOMINAL AORTIC ENDOVASCULAR STENT GRAFT INSERTION;  Surgeon: Krystal JULIANNA Doing, MD;  Location: [facility] OR;  Service: Vascular;  Laterality: N/A;   HEART STENT  04-2010  and  Jun 07, 2013   X 3   IR IMAGING GUIDED PORT INSERTION  05/17/2023   IR RADIOLOGIST EVAL & MGMT  06/21/2023   LEFT HEART CATH AND CORONARY ANGIOGRAPHY N/A 04/03/2019   Procedure: LEFT HEART CATH AND CORONARY ANGIOGRAPHY;  Surgeon: Hanson Lonni, MD;  Location: [facility] INVASIVE CV LAB;  Service: Cardiovascular;  Laterality: N/A;   LEFT HEART CATHETERIZATION WITH CORONARY ANGIOGRAM N/A 06/07/2013   Procedure: LEFT HEART CATHETERIZATION WITH CORONARY ANGIOGRAM;  Surgeon: Lonni JONETTA Cash, MD;  Location: [facility] CATH LAB;  Service: Cardiovascular;  Laterality: N/A;   LEFT HEART CATHETERIZATION WITH CORONARY ANGIOGRAM N/A 02/25/2014   Procedure: LEFT HEART CATHETERIZATION WITH CORONARY ANGIOGRAM;  Surgeon: Debby DELENA Sor, MD;  Location: [facility] CATH LAB;  Service: Cardiovascular;  Laterality: N/A;   LUMBAR FUSION  01/2007   DR. GUST...3-LEVEL WITH FIXATION   OOPHORECTOMY     BSO? pt.unsure   PARATHYROIDECTOMY     SPINE SURGERY     THYROIDECTOMY     TOTAL ABDOMINAL HYSTERECTOMY      Allergies: Crestor  [rosuvastatin  calcium ], Prilosec [omeprazole ], Miconazole nitrate, Augmentin  [amoxicillin -pot clavulanate], and Doxycycline   Medications: Prior to Admission medications   Medication Sig Start  Date End Date Taking? Authorizing Provider  alendronate  (FOSAMAX ) 70 MG tablet Take 1 tablet (70 mg total) by mouth every 7 (seven) days. Take with a full glass of water on an empty stomach. 06/30/23   [name], MD  aspirin  81 MG EC tablet Take 81 mg by mouth daily.     [provider]  atenolol  (TENORMIN ) 25 MG tablet Take 0.5 tablets (12.5 mg total) by mouth daily. 05/30/23   Wyn Jackee VEAR Mickey., NP  Blood Pressure Monitoring (OMRON 3 SERIES BP MONITOR) DEVI Use as directed. 05/30/23   Wyn Jackee VEAR Mickey., NP  Foot Care Products ([employer] GEL HEEL CUSHION WOMENS) PADS Use as directed at bedtime 03/30/23   [name], MD  gabapentin  (NEURONTIN ) 300 MG capsule Take 1-2 tab by mouth at bedtime for pain and sleep Patient taking differently: Take 300 mg by mouth as needed. Take 1-2 tab by mouth at bedtime for pain and sleep 04/22/21   [name], MD  levothyroxine  (SYNTHROID ) 100 MCG tablet Take 1 tablet (100 mcg total) by mouth daily before breakfast. 07/06/23 08/05/23  [name], MD  lidocaine -prilocaine  (EMLA ) cream Apply 1 Application topically as needed. 04/27/23   Heilingoetter, Cassandra L, PA-C  oxyCODONE  (OXY IR/ROXICODONE ) 5 MG immediate release tablet Take 1-2 tablets (5-10 mg total) by mouth every 4 (four) hours as needed for severe pain (pain score 7-10). 07/10/23   Pickenpack-Cousar, Athena N, NP  prochlorperazine  (COMPAZINE ) 10 MG tablet Take 1 tablet (10 mg total) by mouth every 6 (six) hours as needed. 04/27/23   Heilingoetter, Cassandra L, PA-C  temazepam  (RESTORIL ) 15 MG capsule Take 1 capsule (15 mg total) by mouth at bedtime as needed for sleep. 04/10/23   [name], MD  Triamcinolone  Acetonide (ZILRETTA ) 32 MG SRER intra-articular injection 5 mL, intra-articular, bilat knees 01/27/23   Joane Artist RAMAN, MD     Family History  Problem Relation Age of Onset   Heart attack Mother 56       s/p D&C-CARDIAC ARREST 1966   Heart disease Mother    Heart attack Father 49        63 WITH MI   Heart disease Father    Diabetes Brother    Diabetes Maternal Aunt    Arthritis Maternal Aunt    Breast cancer Maternal Aunt        Post menopausal   Colon cancer Neg Hx     Social History   Socioeconomic History   Marital status: Divorced    Spouse name: Not on file   Number of children: 3   Years of education: 13   Highest education level: Not on file  Occupational History   Occupation: RETIRED    Employer: [employer]    Comment: ADMIN SUPPORT    Employer: RETIRED  Tobacco Use   Smoking status: Former    Current packs/day: 0.00    Types: Cigarettes    Start date: 05/30/1956    Quit date: 05/31/1986    Years since quitting: 37.1   Smokeless tobacco: Never  Vaping Use   Vaping status: Never Used  Substance and Sexual Activity   Alcohol use: No   Drug use: No   Sexual activity: Not Currently    Comment: 1st intercourse 78 yo-Fewer than 5 partners  Other Topics Concern   Not on file  Social History Narrative   DIVORCED   3 CHILDREN   PATIENT SIGNED A DESIGNATED PARTY RELEASE TO ALLOW HER DAUGHTER, TRAMAINE Cowdery, TO HAVE ACCESS TO HER MEDICAL RECORDS/INFORMATION. SHARENE PASTURES, May 04, 2009 @ 3:27 PM   Right handed   One story home      Lives alone.2025   .   Social Drivers of Health   Financial Resource Strain: Medium Risk (04/11/2023)   Overall Financial Resource Strain (CARDIA)    Difficulty of Paying Living Expenses: Somewhat hard  Food Insecurity: No Food Insecurity (04/13/2023)   Hunger Vital Sign    Worried About Running Out of Food in the Last Year: Never true    Ran Out of Food in the Last Year: Never true  Transportation Needs: No Transportation Needs (04/13/2023)   PRAPARE - Administrator, Civil Service (Medical): No    Lack of Transportation (Non-Medical): No  Physical Activity: Insufficiently Active (04/11/2023)   Exercise Vital Sign    Days of Exercise per Week: 2 days    Minutes of Exercise per Session: 10  min  Stress: No Stress Concern Present (04/11/2023)   [employer] of Occupational Health - Occupational Stress Questionnaire  Feeling of Stress : Not at all  Social Connections: Moderately Integrated (04/11/2023)   Social Connection and Isolation Panel    Frequency of Communication with Friends and Family: More than three times a week    Frequency of Social Gatherings with Friends and Family: Twice a week    Attends Religious Services: More than 4 times per year    Active Member of [employer] or Organizations: Yes    Attends Banker Meetings: Never    Marital Status: Divorced  Recent Concern: Social Connections - Socially Isolated (03/22/2023)   Social Connection and Isolation Panel    Frequency of Communication with Friends and Family: Twice a week    Frequency of Social Gatherings with Friends and Family: Once a week    Attends Religious Services: Never    Database administrator or Organizations: No    Attends Engineer, structural: Never    Marital Status: Divorced    Review of Systems: A 12 point ROS discussed and pertinent positives are indicated in the HPI above.  All other systems are negative.  Review of Systems  Vital Signs: There were no vitals taken for this visit.  Physical Exam  Imaging: DG Knee            06/21/23   Kellgren and Lawrence grade IV bilaterally  Labs:  CBC: Recent Labs    05/22/23 1345 05/31/23 0959 06/15/23 1059 07/03/23 0854  WBC 3.3* 3.5* 5.4 5.0  HGB 10.6* 10.1* 10.9* 11.4*  HCT 33.0* 30.9* 33.9* 35.2*  PLT 256 218 245 216    COAGS: Recent Labs    03/26/23 0804 03/27/23 0741  INR 1.1 1.2    BMP: Recent Labs    05/22/23 1345 05/31/23 0959 06/15/23 1059 07/03/23 0854  NA 139 137 143 138  K 3.9 3.6 3.6 3.3*  CL 105 103 106 101  CO2 30 29 31 29   GLUCOSE 77 95 119* 108*  BUN 5* <5* 6* 6*  CALCIUM  8.2* 7.7* 8.5* 8.0*  CREATININE 0.57 0.61 0.56 0.60  GFRNONAA >60 >60 >60 >60    LIVER  FUNCTION TESTS: Recent Labs    05/22/23 1345 05/31/23 0959 06/15/23 1059 07/03/23 0854  BILITOT 0.8 0.7 0.5 0.6  AST 11* 11* 9* 11*  ALT 6 5 <5 7  ALKPHOS 85 89 100 89  PROT 7.1 7.0 7.4 7.0  ALBUMIN 3.1* 3.2* 3.3* 3.4*    TUMOR MARKERS: No results for input(s): AFPTM, CEA, CA199, CHROMGRNA in the last 8760 hours.  Assessment and Plan:  Right knee pain: Graceanne H. Howlett, 78 year old female, presents today for an image-guided right geniculate artery embolization.  Risks and benefits of this procedure were discussed with the patient including, but not limited to bleeding, infection, vascular injury or contrast induced renal failure.  All of the patient's questions were answered, patient is agreeable to proceed. She has been NPO.   Consent signed and in chart.  Thank you for this interesting consult.  I greatly enjoyed meeting Pasha [employer] and look forward to participating in their care.  A copy of this report was sent to the requesting provider on this date.  Electronically Signed: Warren Dais, AGACNP-BC 07/14/2023, 2:33 PM   I spent a total of  30 Minutes   in face to face in clinical consultation, greater than 50% of which was counseling/coordinating care for right knee pain.

## 2023-07-17 ENCOUNTER — Ambulatory Visit
Admission: RE | Admit: 2023-07-17 | Discharge: 2023-07-17 | Disposition: A | Discharge status: Home / Self Care | Source: Ambulatory Visit | Attending: Interventional Radiology | Admitting: Interventional Radiology

## 2023-07-17 DIAGNOSIS — I798 Other disorders of arteries, arterioles and capillaries in diseases classified elsewhere: Secondary | ICD-10-CM | POA: Diagnosis not present

## 2023-07-17 DIAGNOSIS — M25561 Pain in right knee: Secondary | ICD-10-CM

## 2023-07-17 DIAGNOSIS — I739 Peripheral vascular disease, unspecified: Secondary | ICD-10-CM | POA: Diagnosis not present

## 2023-07-17 DIAGNOSIS — M1711 Unilateral primary osteoarthritis, right knee: Secondary | ICD-10-CM

## 2023-07-17 DIAGNOSIS — I872 Venous insufficiency (chronic) (peripheral): Secondary | ICD-10-CM | POA: Diagnosis not present

## 2023-07-17 HISTORY — PX: IR EMBO ARTERIAL NOT HEMORR HEMANG INC GUIDE ROADMAPPING: IMG5448

## 2023-07-17 MED ORDER — KETOROLAC TROMETHAMINE 30 MG/ML IJ SOLN
30.0000 mg | Freq: Once | INTRAMUSCULAR | Status: AC
  Administered 2023-07-17: 30 mg via INTRAVENOUS

## 2023-07-17 MED ORDER — IIOPAMIDOL (ISOVUE-250) INJECTION 51%
150.0000 mL | Freq: Once | INTRAVENOUS | Status: AC | PRN
  Administered 2023-07-17: 110 mL via INTRA_ARTERIAL

## 2023-07-17 MED ORDER — HEPARIN SOD (PORK) LOCK FLUSH 100 UNIT/ML IV SOLN
500.0000 [IU] | Freq: Once | INTRAVENOUS | Status: AC
  Administered 2023-07-17: 500 [IU] via INTRAVENOUS

## 2023-07-17 MED ORDER — LIDOCAINE-EPINEPHRINE (PF) 1 %-1:200000 IJ SOLN
10.0000 mL | Freq: Once | INTRAMUSCULAR | Status: AC
  Administered 2023-07-17: 10 mL via INTRADERMAL

## 2023-07-17 MED ORDER — ACETAMINOPHEN 10 MG/ML IV SOLN
1000.0000 mg | Freq: Once | INTRAVENOUS | Status: AC
  Administered 2023-07-17: 1000 mg via INTRAVENOUS

## 2023-07-17 MED ORDER — FENTANYL CITRATE PF 50 MCG/ML IJ SOSY: 25.0000 ug | PREFILLED_SYRINGE | INTRAMUSCULAR | Status: DC | PRN

## 2023-07-17 MED ORDER — NITROGLYCERIN 1 MG/10 ML FOR IR/CATH LAB
100.0000 ug | Freq: Once | INTRA_ARTERIAL | Status: AC
  Administered 2023-07-17: 100 ug via INTRA_ARTERIAL

## 2023-07-17 MED ORDER — MIDAZOLAM HCL 2 MG/2ML IJ SOLN
INTRAMUSCULAR | Status: AC | PRN
  Administered 2023-07-17 (×2): 1 mg via INTRAVENOUS

## 2023-07-17 MED ORDER — DEXAMETHASONE SODIUM PHOSPHATE 10 MG/ML IJ SOLN
10.0000 mg | Freq: Once | INTRAMUSCULAR | Status: AC
  Administered 2023-07-17: 10 mg via INTRAVENOUS

## 2023-07-17 MED ORDER — FENTANYL CITRATE (PF) 100 MCG/2ML IJ SOLN
INTRAMUSCULAR | Status: AC | PRN
  Administered 2023-07-17 (×3): 25 ug via INTRAVENOUS

## 2023-07-17 MED ORDER — SODIUM CHLORIDE 0.9 % IV SOLN: INTRAVENOUS | Status: DC

## 2023-07-17 MED ORDER — ONDANSETRON HCL 4 MG/2ML IJ SOLN: 4.0000 mg | Freq: Once | INTRAMUSCULAR | Status: DC

## 2023-07-17 MED ORDER — NITROGLYCERIN 1 MG/10 ML FOR IR/CATH LAB
INTRA_ARTERIAL | Status: AC | PRN
  Administered 2023-07-17: 100 ug via INTRA_ARTERIAL

## 2023-07-17 MED ORDER — SODIUM CHLORIDE 0.9% FLUSH: 10.0000 mL | INTRAVENOUS | Status: DC | PRN

## 2023-07-17 MED ORDER — MIDAZOLAM HCL 2 MG/2ML IJ SOLN: 1.0000 mg | INTRAMUSCULAR | Status: DC | PRN

## 2023-07-17 NOTE — Discharge Instructions (Signed)
 Discharge Instructions for Genicular Artery Embolization (GAE)     Rest for the remainder of the day.  Avoid strenuous activities and heavy lifting for 48 hours. Gradually resume normal activities as tolerated.   You may experience mild pain or discomfort at the catheter insertion site or in the knee. This is normal. Use over-the-counter pain relievers such as acetaminophen  (Tylenol ) or ibuprofen  (Advil ) as directed. Apply an ice pack to the knee for 15-20 minutes every 2-3 hours to reduce swelling and discomfort.  Take the Medrol  Dosepak as directed per pharmacy.   Keep the catheter insertion site clean and dry. Remove the bandage after 24 hours and replace it with a clean, dry bandage if needed. Avoid soaking in baths, hot tubs, or swimming pools for 5 days. Showers are allowed.   Take any prescribed medications as directed. If you were taking blood thinners, follow your physician's instructions on when to resume them.   Resume your normal diet. Drink plenty of fluids to stay hydrated.   Schedule a follow-up appointment with your physician as instructed.   Call our office at [phone] with any concerns, including if you experience increased pain, swelling, warmth, redness, or drainage at the insertion site, if you have a fever over 100.39F (38C), or if you experience sudden weakness or numbness in the treated leg.   Please ensure you follow these instructions carefully and reach out to your healthcare provider if you have any concerns or questions. Wishing you a smooth and speedy recovery!    If you need to speak to someone after hours or on the weekend, please call the Interventional Radiologist on-call service at [phone]. Tell them you are a patient of Dr. Terrill and that you had a GAE today, along with any issues you are having.    Thank you for visiting [facility]!

## 2023-07-18 ENCOUNTER — Telehealth: Payer: Self-pay

## 2023-07-18 NOTE — Telephone Encounter (Signed)
  Phone call to patient to follow up from her GAE on 07/17/23. She reports that her knee pain is not too bad, though she has been in bed since getting home from the procedure yesterday. She denies any signs of bleeding, infection, redness at the right femoral site, drainage at the site, fever, nausea, or vomiting. Dr. Jennefer will call her within the month to check on her status post-procedure. She was advised to call back if anything were to change or any concerns arise and we will arrange an in person appointment. Patient verbalized understanding.

## 2023-07-24 ENCOUNTER — Telehealth: Payer: Self-pay | Admitting: Internal Medicine

## 2023-07-24 NOTE — Telephone Encounter (Signed)
 Patient called and says her synthroid  needs to be the brand name and not the generic name. She says the one she was on before is Synthroid  0.15mg  (150 mcg) tablet. The one she picked up is generic levothyroxine  and she needs the brand name only. She says she really needs it because her neck has been bothering her. Advised [name] will send this to Dr. [name].  [id number] [place], [place] - [address] AT [facility] OF [address] Phone: [phone]  Fax: [phone]       Copied from CRM [id number]. Topic: Clinical - Medication Question >> Jul 24, 2023 11:32 AM [name] wrote: Reason for CRM: Patient has some questions and concerns regarding medication. Patient would like a callback from nurse.

## 2023-07-24 NOTE — Telephone Encounter (Signed)
 No sorry, neck pain is not a reasonable reason to need a brand name thyroid  med, ok to continue the same

## 2023-07-26 NOTE — Telephone Encounter (Signed)
 Ok to let pt know - no need to change anything now as the June 16 lab testing was normal

## 2023-07-26 NOTE — Telephone Encounter (Signed)
 Copied from CRM [id number]. Topic: Clinical - Medication Question >> Jul 26, 2023 10:16 AM Burnard DEL wrote: Reason for CRM: Patient called in stating that she can't take  levothyroxine  (SYNTHROID ) 100 MCG tablet  ,she said that its not working.She stated that she needs  SYNTHROID  0.15 MG (150 MCG) stated that that is the original pone that she has been taking ,she has to take the name brand and not the generic bone.

## 2023-07-26 NOTE — Telephone Encounter (Signed)
 Copied from CRM [id number]. Topic: Clinical - Medication Question >> Jul 25, 2023  5:17 PM Chiquita SQUIBB wrote: Reason for CRM: Patient is calling in to follow up regarding her synthroid  medication, please contact the patient back regarding this medication.

## 2023-07-27 NOTE — Telephone Encounter (Signed)
 Attempted to call patient but had to leave a voice message for call back.

## 2023-07-27 NOTE — Telephone Encounter (Signed)
 Copied from CRM [id number]. Topic: General - Other >> Jul 27, 2023 11:57 AM Harlene ORN wrote: Reason for CRM: was told to return a call for [facility]. Is not sure what the call was about. Please call back patient.

## 2023-07-28 ENCOUNTER — Other Ambulatory Visit: Payer: Self-pay | Admitting: Internal Medicine

## 2023-07-28 NOTE — Telephone Encounter (Signed)
 Sorry I decline to effect any change at this time as this is not felt to be medically necessary.  thanks

## 2023-07-28 NOTE — Telephone Encounter (Signed)
 Called and spoke with patient regarding this medication issue. She said since she had been put on the generic of the Synthroid  she has been very fatigued and off feeling. She stated she wasn't sure as to why she was switched from the name brand to the generic, as well as why she was put on the 100 MCG vs the 150 MCG. Please advise

## 2023-07-28 NOTE — Telephone Encounter (Signed)
 Attempted to call again and left a voice message for the patient

## 2023-08-01 ENCOUNTER — Encounter: Payer: Self-pay | Admitting: Nurse Practitioner

## 2023-08-01 ENCOUNTER — Inpatient Hospital Stay (HOSPITAL_BASED_OUTPATIENT_CLINIC_OR_DEPARTMENT_OTHER): Admitting: Internal Medicine

## 2023-08-01 ENCOUNTER — Inpatient Hospital Stay

## 2023-08-01 ENCOUNTER — Inpatient Hospital Stay: Attending: Internal Medicine

## 2023-08-01 ENCOUNTER — Inpatient Hospital Stay (HOSPITAL_BASED_OUTPATIENT_CLINIC_OR_DEPARTMENT_OTHER): Admitting: Nurse Practitioner

## 2023-08-01 VITALS — BP 97/57 | HR 74 | Temp 97.9°F | Resp 16 | Ht 66.0 in | Wt 174.0 lb

## 2023-08-01 DIAGNOSIS — Z5112 Encounter for antineoplastic immunotherapy: Secondary | ICD-10-CM | POA: Insufficient documentation

## 2023-08-01 DIAGNOSIS — C3411 Malignant neoplasm of upper lobe, right bronchus or lung: Secondary | ICD-10-CM

## 2023-08-01 DIAGNOSIS — Z515 Encounter for palliative care: Secondary | ICD-10-CM | POA: Diagnosis not present

## 2023-08-01 DIAGNOSIS — G893 Neoplasm related pain (acute) (chronic): Secondary | ICD-10-CM | POA: Diagnosis not present

## 2023-08-01 DIAGNOSIS — K5903 Drug induced constipation: Secondary | ICD-10-CM | POA: Diagnosis not present

## 2023-08-01 DIAGNOSIS — Z95828 Presence of other vascular implants and grafts: Secondary | ICD-10-CM

## 2023-08-01 DIAGNOSIS — G479 Sleep disorder, unspecified: Secondary | ICD-10-CM | POA: Diagnosis not present

## 2023-08-01 DIAGNOSIS — R530 Neoplastic (malignant) related fatigue: Secondary | ICD-10-CM | POA: Diagnosis not present

## 2023-08-01 LAB — CBC WITH DIFFERENTIAL (CANCER CENTER ONLY)
Abs Immature Granulocytes: 0.04 K/uL (ref 0.00–0.07)
Basophils Absolute: 0 K/uL (ref 0.0–0.1)
Basophils Relative: 0 %
Eosinophils Absolute: 0.1 K/uL (ref 0.0–0.5)
Eosinophils Relative: 2 %
HCT: 38.9 % (ref 36.0–46.0)
Hemoglobin: 12.5 g/dL (ref 12.0–15.0)
Immature Granulocytes: 1 %
Lymphocytes Relative: 20 %
Lymphs Abs: 1.2 K/uL (ref 0.7–4.0)
MCH: 30 pg (ref 26.0–34.0)
MCHC: 32.1 g/dL (ref 30.0–36.0)
MCV: 93.3 fL (ref 80.0–100.0)
Monocytes Absolute: 0.4 K/uL (ref 0.1–1.0)
Monocytes Relative: 7 %
Neutro Abs: 4.5 K/uL (ref 1.7–7.7)
Neutrophils Relative %: 70 %
Platelet Count: 265 K/uL (ref 150–400)
RBC: 4.17 MIL/uL (ref 3.87–5.11)
RDW: 18 % — ABNORMAL HIGH (ref 11.5–15.5)
WBC Count: 6.3 K/uL (ref 4.0–10.5)
nRBC: 0 % (ref 0.0–0.2)

## 2023-08-01 LAB — CMP (CANCER CENTER ONLY)
ALT: 5 U/L (ref 0–44)
AST: 11 U/L — ABNORMAL LOW (ref 15–41)
Albumin: 3.3 g/dL — ABNORMAL LOW (ref 3.5–5.0)
Alkaline Phosphatase: 88 U/L (ref 38–126)
Anion gap: 6 (ref 5–15)
BUN: 7 mg/dL — ABNORMAL LOW (ref 8–23)
CO2: 30 mmol/L (ref 22–32)
Calcium: 8.2 mg/dL — ABNORMAL LOW (ref 8.9–10.3)
Chloride: 105 mmol/L (ref 98–111)
Creatinine: 0.69 mg/dL (ref 0.44–1.00)
GFR, Estimated: >60 mL/min (ref >60)
Glucose, Bld: 76 mg/dL (ref 70–99)
Potassium: 3.9 mmol/L (ref 3.5–5.1)
Sodium: 141 mmol/L (ref 135–145)
Total Bilirubin: 0.5 mg/dL (ref 0.0–1.2)
Total Protein: 6.9 g/dL (ref 6.5–8.1)

## 2023-08-01 MED ORDER — SODIUM CHLORIDE 0.9% FLUSH
10.0000 mL | Freq: Once | INTRAVENOUS | Status: AC
  Administered 2023-08-01: 10 mL

## 2023-08-01 MED ORDER — TEMAZEPAM 15 MG PO CAPS: 15.0000 mg | ORAL_CAPSULE | Freq: Every evening | ORAL | 2 refills | Status: DC | PRN

## 2023-08-01 MED ORDER — SODIUM CHLORIDE 0.9 % IV SOLN
1500.0000 mg | Freq: Once | INTRAVENOUS | Status: AC
  Administered 2023-08-01: 1500 mg via INTRAVENOUS
  Filled 2023-08-01: qty 30

## 2023-08-01 MED ORDER — SODIUM CHLORIDE 0.9 % IV SOLN
INTRAVENOUS | Status: DC
Start: 2023-08-01 — End: 2023-08-01

## 2023-08-01 MED ORDER — OXYCODONE HCL 5 MG PO TABS: 5.0000 mg | ORAL_TABLET | ORAL | 0 refills | Status: DC | PRN

## 2023-08-01 NOTE — Progress Notes (Signed)
 [facility] Telephone:([phone]) [phone]   [phone]  OFFICE PROGRESS NOTE  Brandi Lynwood ORN, MD [address] [place] [address]  DIAGNOSIS: Stage IIIB (T4, N2b, M0) non-small cell lung cancer, squamous cell carcinoma. She presented with large pleural-based peripheral right upper lobe lung mass measuring up to 8.2 cm in size with large right paratracheal node with low level hypermetabolic uptake.  Diagnosed in March 2025.   PDL1: 15%   PRIOR THERAPY: concurrent chemoradiation with carboplatin  for an AUC of 2 and taxol  45 mg/m2. First dose expected on 05/08/23.  Status post 4 cycles.  Last dose was given on 05/30/2023.   CURRENT THERAPY: Consolidation treatment with immunotherapy with Imfinzi  1500 Mg IV every 4 weeks.  First dose 07/03/2023.  Status post 1 cycle.  INTERVAL HISTORY: [name] 78 y.o. female returns to the clinic today for follow-up visit accompanied by her daughter.Discussed the use of AI scribe software for clinical note transcription with the patient, who gave verbal consent to proceed.  History of Present Illness [name] is a 78 year old female with stage three B non-small cell lung cancer who presents for evaluation before starting cycle two of immunotherapy.  She was diagnosed with stage three B non-small cell lung cancer, squamous cell carcinoma, in March 2025 with a PD-L1 expression of 15%.  She underwent a course of concurrent chemoradiation with weekly carboplatin  and paclitaxel  and is currently on consolidation treatment with immunotherapy using Imfinzi , 1500 mg IV every four weeks. She has completed one cycle of this treatment.  She feels good and notes that her appetite has returned. She feels physically much better, attributing this improvement to the current medication regimen. She reports that the current medication is easier to tolerate than her previous chemotherapy and radiation, and she is pleased with the less  frequent treatment schedule of every four weeks.    MEDICAL HISTORY: Past Medical History:  Diagnosis Date   AAA (abdominal aortic aneurysm) (HCC)    a. s/p stent graft repair 01/2016.   Acute ischemic colitis (HCC) 07/29/2010   Diagnosed June, 2012 characterized by acute lower GI bleeding, spontaneously resolved.    Acute respiratory infection 06/09/2014   Acute sinus infection 05/14/2015   Allergic rhinitis, cause unspecified    Angioedema of lips 06/03/2014   Anxiety state, unspecified    Arthritis of left hip 04/16/2015   Bilateral hearing loss 07/19/2017   Chronic LBP    Coronary artery disease    a. inferior MI 1998 s/p PCI of RCA. b. stenting of Cx 04/2010. c. DES to prox LAD 05/2013. d. DES x 3 in 10/2015. // Myoview  07/2018:  EF 59, normal perfusion, low risk    Coronary artery disease involving native heart without angina pectoris 07/31/2006   Qualifier: Diagnosis of  By: Harlow MD, Ozell BRAVO  ANGIOGRAPHIC DATA:  1. Ventriculography done in the RAO projection reveals a small wall  motion abnormality in the mid inferior wall. Ejection fraction  would be estimated at around 50%.  2. The right coronary artery has some progressive disease of about 60-  80% in the proximal mid segment. The distal vessel appears to be  50-70% narrowing althou   Degeneration of lumbar or lumbosacral intervertebral disc    Depressive disorder, not elsewhere classified    Diabetes (HCC) 11/03/2006   Qualifier: Diagnosis of  By: [name] MD, Lynwood [name]    Diabetes mellitus    TYPE II  Frequent urination 05/08/2014   Gout 08/22/2013   History of endovascular stent graft for abdominal aortic aneurysm (AAA) 01/22/2016   History of radiation therapy    Right lung-04/19/23-05/29/23-Dr. [name]   Hyperlipidemia    Hypertension    Hypothyroidism    Iron  deficiency anemia 06/13/2017   Lower GI bleed 06/2010   Diverticular bleed   Lumbar radiculopathy 05/15/2015   MENOPAUSAL DISORDER 10/29/2007    Qualifier: Diagnosis of  By: [name] MD, Lynwood [name]    Myalgia 09/25/2013   Noncompliance with medications 02/26/2014   Obesity, unspecified    OTITIS MEDIA, SEROUS, CHRONIC 04/23/2007   Qualifier: Diagnosis of  By: Harlow MD, Ozell BRAVO    Thoracic disc disease 11/19/2018   URTICARIA 09/23/2009   Qualifier: Diagnosis of  By: Inocencio MD, Berwyn A    Venous insufficiency 07/19/2017    ALLERGIES:  is allergic to crestor  [rosuvastatin  calcium ], prilosec [omeprazole ], miconazole nitrate, augmentin  [amoxicillin -pot clavulanate], and doxycycline .  MEDICATIONS:  Current Outpatient Medications  Medication Sig Dispense Refill   alendronate  (FOSAMAX ) 70 MG tablet Take 1 tablet (70 mg total) by mouth every 7 (seven) days. Take with a full glass of water on an empty stomach. 12 tablet 3   aspirin  81 MG EC tablet Take 81 mg by mouth daily.      atenolol  (TENORMIN ) 25 MG tablet Take 0.5 tablets (12.5 mg total) by mouth daily. 90 tablet 0   Blood Pressure Monitoring (OMRON 3 SERIES BP MONITOR) DEVI Use as directed. 1 each 0   Foot Care Products ([employer] GEL HEEL CUSHION WOMENS) PADS Use as directed at bedtime 2 each 5   gabapentin  (NEURONTIN ) 300 MG capsule Take 1-2 tab by mouth at bedtime for pain and sleep (Patient taking differently: Take 300 mg by mouth as needed. Take 1-2 tab by mouth at bedtime for pain and sleep) 180 capsule 1   levothyroxine  (SYNTHROID ) 100 MCG tablet Take 1 tablet (100 mcg total) by mouth daily before breakfast. 90 tablet 3   lidocaine -prilocaine  (EMLA ) cream Apply 1 Application topically as needed. 30 g 2   methylPREDNISolone  (MEDROL  DOSEPAK) 4 MG TBPK tablet Taper per pharmacy 21 tablet 0   oxyCODONE  (OXY IR/ROXICODONE ) 5 MG immediate release tablet Take 1-2 tablets (5-10 mg total) by mouth every 4 (four) hours as needed for severe pain (pain score 7-10). 60 tablet 0   prochlorperazine  (COMPAZINE ) 10 MG tablet Take 1 tablet (10 mg total) by mouth every 6 (six) hours as needed. 30  tablet 2   temazepam  (RESTORIL ) 15 MG capsule Take 1 capsule (15 mg total) by mouth at bedtime as needed for sleep. 30 capsule 2   Triamcinolone  Acetonide (ZILRETTA ) 32 MG SRER intra-articular injection 5 mL, intra-articular, bilat knees 2 each 0   No current facility-administered medications for this visit.    SURGICAL HISTORY:  Past Surgical History:  Procedure Laterality Date   CARDIAC CATHETERIZATION     PCI OF BOTH THE CIRCUMFLEX AND LEFT ANTERIOR DESCENDING ARTERY   CARDIAC CATHETERIZATION N/A 10/29/2015   Procedure: Coronary Stent Intervention;  Surgeon: Lonni Hanson, MD;  Location: [facility] INVASIVE CV LAB;  Service: Cardiovascular;  Laterality: N/A;   CARDIAC CATHETERIZATION N/A 10/29/2015   Procedure: Coronary/Graft Angiography;  Surgeon: Lonni Hanson, MD;  Location: [facility] INVASIVE CV LAB;  Service: Cardiovascular;  Laterality: N/A;   CARDIAC CATHETERIZATION N/A 10/29/2015   Procedure: Intravascular Pressure Wire/FFR Study;  Surgeon: Lonni Hanson, MD;  Location: [facility] INVASIVE CV LAB;  Service: Cardiovascular;  Laterality: N/A;  CESAREAN SECTION     ENDOVASCULAR STENT INSERTION N/A 01/22/2016   Procedure: ABDOMINAL AORTIC ENDOVASCULAR STENT GRAFT INSERTION;  Surgeon: Krystal JULIANNA Doing, MD;  Location: [facility] OR;  Service: Vascular;  Laterality: N/A;   HEART STENT  04-2010  and  Jun 07, 2013   X 3   IR EMBO ARTERIAL NOT HEMORR HEMANG INC GUIDE ROADMAPPING  07/17/2023   IR IMAGING GUIDED PORT INSERTION  05/17/2023   IR RADIOLOGIST EVAL & MGMT  06/21/2023   LEFT HEART CATH AND CORONARY ANGIOGRAPHY N/A 04/03/2019   Procedure: LEFT HEART CATH AND CORONARY ANGIOGRAPHY;  Surgeon: Mady Bruckner, MD;  Location: [facility] INVASIVE CV LAB;  Service: Cardiovascular;  Laterality: N/A;   LEFT HEART CATHETERIZATION WITH CORONARY ANGIOGRAM N/A 06/07/2013   Procedure: LEFT HEART CATHETERIZATION WITH CORONARY ANGIOGRAM;  Surgeon: Bruckner JONETTA Cash, MD;  Location: [facility] CATH LAB;  Service: Cardiovascular;  Laterality:  N/A;   LEFT HEART CATHETERIZATION WITH CORONARY ANGIOGRAM N/A 02/25/2014   Procedure: LEFT HEART CATHETERIZATION WITH CORONARY ANGIOGRAM;  Surgeon: Debby DELENA Sor, MD;  Location: [facility] CATH LAB;  Service: Cardiovascular;  Laterality: N/A;   LUMBAR FUSION  01/2007   DR. GUST...3-LEVEL WITH FIXATION   OOPHORECTOMY     BSO? pt.unsure   PARATHYROIDECTOMY     SPINE SURGERY     THYROIDECTOMY     TOTAL ABDOMINAL HYSTERECTOMY      REVIEW OF SYSTEMS:  A comprehensive review of systems was negative except for: Constitutional: positive for fatigue   PHYSICAL EXAMINATION: General appearance: alert, cooperative, fatigued, and no distress Head: Normocephalic, without obvious abnormality, atraumatic Neck: no adenopathy, no JVD, supple, symmetrical, trachea midline, and thyroid  not enlarged, symmetric, no tenderness/mass/nodules Lymph nodes: Cervical, supraclavicular, and axillary nodes normal. Resp: clear to auscultation bilaterally Back: symmetric, no curvature. ROM normal. No CVA tenderness. Cardio: regular rate and rhythm, S1, S2 normal, no murmur, click, rub or gallop GI: soft, non-tender; bowel sounds normal; no masses,  no organomegaly Extremities: extremities normal, atraumatic, no cyanosis or edema  ECOG PERFORMANCE STATUS: 1 - Symptomatic but completely ambulatory  Blood pressure (!) 97/57, pulse 74, temperature 97.9 F (36.6 C), temperature source Temporal, resp. rate 16, height 5' 6 (1.676 m), weight 174 lb (78.9 kg), SpO2 100%.  LABORATORY DATA: Lab Results  Component Value Date   WBC 6.3 08/01/2023   HGB 12.5 08/01/2023   HCT 38.9 08/01/2023   MCV 93.3 08/01/2023   PLT 265 08/01/2023      Chemistry      Component Value Date/Time   NA 138 07/03/2023 0854   NA 141 06/08/2021 1606   K 3.3 (L) 07/03/2023 0854   CL 101 07/03/2023 0854   CO2 29 07/03/2023 0854   BUN 6 (L) 07/03/2023 0854   BUN 4 (L) 06/08/2021 1606   CREATININE 0.60 07/03/2023 0854      Component Value  Date/Time   CALCIUM  8.0 (L) 07/03/2023 0854   ALKPHOS 89 07/03/2023 0854   AST 11 (L) 07/03/2023 0854   ALT 7 07/03/2023 0854   BILITOT 0.6 07/03/2023 0854       RADIOGRAPHIC STUDIES: IR EMBO ARTERIAL NOT HEMORR HEMANG INC GUIDE ROADMAPPING Result Date: 07/17/2023 INDICATION: 78 year old female with severe right knee pain secondary to osteoarthritis, refractory to conservative measures. EXAM: 1. Ultrasound-guided vascular access of the right superficial femoral artery. 2. Limited right lower extremity angiogram. 3. Selective catheterization and angiography of the superolateral genicular artery and inferolateral geniculate artery. 4. Particle embolization of the articular branches of the  superolateral and inferolateral geniculate arteries. MEDICATIONS: None. ANESTHESIA/SEDATION: Moderate (conscious) sedation was employed during this procedure. A total of Versed  2 mg and Fentanyl  100 mcg was administered intravenously. Moderate Sedation Time: 69 minutes. The patient's level of consciousness and vital signs were monitored continuously by radiology nursing throughout the procedure under my direct supervision. CONTRAST:  110 mL Isovue  250, intra arterial FLUOROSCOPY: Radiation Exposure Index (as provided by the fluoroscopic device): 267.3 mGy Kerma COMPLICATIONS: None immediate. PROCEDURE: Informed consent was obtained from the patient following explanation of the procedure, risks, benefits and alternatives. The patient understands, agrees and consents for the procedure. All questions were addressed. A time out was performed prior to the initiation of the procedure. Maximal barrier sterile technique utilized including caps, mask, sterile gowns, sterile gloves, large sterile drape, hand hygiene, and Betadine prep. Preprocedure ultrasound evaluation demonstrated patency of the right superficial femoral artery. The procedure was planned. Subdermal Local anesthesia was administered at the planned needle entry  site. A small skin nick was made. Under direct ultrasound visualization, a 21 gauge micropuncture needle was directed into the proximal superficial femoral artery. A permanent image was captured and stored in the record. A micropuncture sheath was introduced through which an Amplatz wire was directed to the mid superficial femoral artery. The sheath was exchanged for a 4 French C2 glide catheter which was advanced under fluoroscopic visualization to the mid to distal superficial femoral artery. Limited right lower extremity angiogram was performed which demonstrated patency of the superficial femoral and popliteal arteries. The descending geniculate, superolateral geniculate common inferolateral genicular arteries or visualized supplying the knee joint. Initially, attempts were made at selecting the descending geniculate artery, however due to severe stenosis proximally at the ostium, the artery was unable to be catheterized. Therefore, attention was turned to the superolateral geniculate artery. A Glidewire was used to the of the glide catheter to select the ostium followed by placement of a 1.9 [place] triple angled Progreat lambda microcatheter. Dedicated angiogram of the superior lateral genicular artery was then obtained which demonstrated diffuse hyperemia about the lateral femoral condyle. The articular branch supplying this region was then selected. 100 mcg nitroglycerin  was administered at this location followed by embolization with a dilute solution of 250 micron Embozene severe switch were administered until elimination of the previously visualized hyperemia was achieved. The catheter was retracted and completion angiogram of the superolateral geniculate artery was performed which demonstrated resolution of previously visualized hyperemia and preserved patency of the parent vessels. The catheter was then retracted and redirected to the inferolateral geniculate artery. Angiogram was performed which  demonstrated diffuse hyperemia about the lateral aspect of the tibial plateau. The articular branch was selected and 100 mcg nitroglycerin  was administered at this location followed by embolization with a dilute solution of 250 micron Embozene seizures. Catheter was retracted and completion angiography was performed demonstrating complete embolization of the previously visualized hyperemia with preservation of the parent vessels. The catheters were then removed. Hemostasis was achieved with brief manual compression. Peripheral pulses are unchanged. The patient tolerated the procedure well and was transferred to the recovery area in good condition. IMPRESSION: Technically successful right geniculate artery embolization targeting the articular branches of the superolateral and inferolateral geniculate arteries. Ester Sides, MD [facility] Radiology Electronically Signed   By: Ester Sides M.D.   On: 07/17/2023 13:07    ASSESSMENT AND PLAN: This is a very pleasant 78 years old African-American female with Stage IIIB (T4, N2b, M0) non-small cell  lung cancer, squamous cell carcinoma. She presented with large pleural-based peripheral right upper lobe lung mass measuring up to 8.2 cm in size with large right paratracheal node with low level hypermetabolic uptake. She underwent a course of concurrent chemoradiation with weekly carboplatin  for AUC of 2 and paclitaxel  45 Mg/M2.  Status post 4 cycles. She is currently undergoing consolidation treatment with immunotherapy with Imfinzi  1500 Mg IV every 4 weeks status post 1 cycle. She tolerated the first cycle of her treatment fairly well. Assessment and Plan Assessment & Plan Stage 3B non-small cell lung cancer, squamous cell carcinoma Diagnosed in March 2025 with PD-L1 expression of 15%. Underwent concurrent chemoradiation with weekly carboplatin  and paclitaxel . Currently on consolidation treatment with Imfinzi  1500  mg IV every four weeks. Post one cycle of immunotherapy. Reports improved appetite and overall feeling better. No new scans since last evaluation, but planned every three months to assess treatment efficacy. Immunotherapy expected to be less harsh than previous chemoradiation. - Administer Imfinzi  1500 mg IV today. - Order a scan in four weeks to assess treatment response. - Schedule follow-up appointment in four weeks. She was advised to call immediately if she has any concerning symptoms in the interval.  The patient voices understanding of current disease status and treatment options and is in agreement with the current care plan.  All questions were answered. The patient knows to call the clinic with any problems, questions or concerns. We can certainly see the patient much sooner if necessary. The total time spent in the appointment was 20 minutes including review of chart and various tests results, discussions about plan of care and coordination of care plan .   Disclaimer: This note was dictated with voice recognition software. Similar sounding words can inadvertently be transcribed and may not be corrected upon review.

## 2023-08-01 NOTE — Patient Instructions (Signed)
 [facility] - A DEPT OF [facility]  Discharge Instructions: Thank you for choosing [facility] to provide your oncology and hematology care.   If you have a lab appointment with the Cancer Center, please go directly to the Cancer Center and check in at the registration area.   Wear comfortable clothing and clothing appropriate for easy access to any Portacath or PICC line.   We strive to give you quality time with your provider. You may need to reschedule your appointment if you arrive late (15 or more minutes).  Arriving late affects you and other patients whose appointments are after yours.  Also, if you miss three or more appointments without notifying the office, you may be dismissed from the clinic at the provider's discretion.      For prescription refill requests, have your pharmacy contact our office and allow 72 hours for refills to be completed.    Today you received the following chemotherapy and/or immunotherapy agents: Imfinzi     To help prevent nausea and vomiting after your treatment, we encourage you to take your nausea medication as directed.  BELOW ARE SYMPTOMS THAT SHOULD BE REPORTED IMMEDIATELY: *FEVER GREATER THAN 100.4 F (38 C) OR HIGHER *CHILLS OR SWEATING *NAUSEA AND VOMITING THAT IS NOT CONTROLLED WITH YOUR NAUSEA MEDICATION *UNUSUAL SHORTNESS OF BREATH *UNUSUAL BRUISING OR BLEEDING *URINARY PROBLEMS (pain or burning when urinating, or frequent urination) *BOWEL PROBLEMS (unusual diarrhea, constipation, pain near the anus) TENDERNESS IN MOUTH AND THROAT WITH OR WITHOUT PRESENCE OF ULCERS (sore throat, sores in mouth, or a toothache) UNUSUAL RASH, SWELLING OR PAIN  UNUSUAL VAGINAL DISCHARGE OR ITCHING   Items with * indicate a potential emergency and should be followed up as soon as possible or go to the Emergency Department if any problems should occur.  Please show the CHEMOTHERAPY ALERT CARD or IMMUNOTHERAPY  ALERT CARD at check-in to the Emergency Department and triage nurse.  Should you have questions after your visit or need to cancel or reschedule your appointment, please contact [facility] - A DEPT OF [facility]  Dept: [phone]  and follow the prompts.  Office hours are 8:00 a.m. to 4:30 p.m. Monday - Friday. Please note that voicemails left after 4:00 p.m. may not be returned until the following business day.  We are closed weekends and major holidays. You have access to a nurse at all times for urgent questions. Please call the main number to the clinic Dept: [phone] and follow the prompts.   For any non-urgent questions, you may also contact your provider using MyChart. We now offer e-Visits for anyone 51 and older to request care online for non-urgent symptoms. For details visit [email or web address].   Also download the MyChart app! Go to the app store, search MyChart, open the app, select [facility], and log in with your MyChart username and password.  Paclitaxel  Injection What is this medication? PACLITAXEL  (PAK li TAX el) treats some types of cancer. It works by slowing down the growth of cancer cells. This medicine may be used for other purposes; ask your health care provider or pharmacist if you have questions. COMMON BRAND NAME(S): Onxol, Taxol  What should I tell my care team before I take this medication? They need to know if you have any of these conditions: Heart disease Liver disease Low white blood cell levels An unusual or allergic reaction to paclitaxel , other medications, foods, dyes,  or preservatives If you or your partner are pregnant or trying to get pregnant Breast-feeding How should I use this medication? This medication is injected into a vein. It is given by your care team in a hospital or clinic setting. Talk to your care team about the use of this medication in children. While it may be given to children for selected  conditions, precautions do apply. Overdosage: If you think you have taken too much of this medicine contact a poison control center or emergency room at once. NOTE: This medicine is only for you. Do not share this medicine with others. What if I miss a dose? Keep appointments for follow-up doses. It is important not to miss your dose. Call your care team if you are unable to keep an appointment. What may interact with this medication? Do not take this medication with any of the following: Live virus vaccines Other medications may affect the way this medication works. Talk with your care team about all of the medications you take. They may suggest changes to your treatment plan to lower the risk of side effects and to make sure your medications work as intended. This list may not describe all possible interactions. Give your health care provider a list of all the medicines, herbs, non-prescription drugs, or dietary supplements you use. Also tell them if you smoke, drink alcohol, or use illegal drugs. Some items may interact with your medicine. What should I watch for while using this medication? Your condition will be monitored carefully while you are receiving this medication. You may need blood work while taking this medication. This medication may make you feel generally unwell. This is not uncommon as chemotherapy can affect healthy cells as well as cancer cells. Report any side effects. Continue your course of treatment even though you feel ill unless your care team tells you to stop. This medication can cause serious allergic reactions. To reduce the risk, your care team may give you other medications to take before receiving this one. Be sure to follow the directions from your care team. This medication may increase your risk of getting an infection. Call your care team for advice if you get a fever, chills, sore throat, or other symptoms of a cold or flu. Do not treat yourself. Try to avoid  being around people who are sick. This medication may increase your risk to bruise or bleed. Call your care team if you notice any unusual bleeding. Be careful brushing or flossing your teeth or using a toothpick because you may get an infection or bleed more easily. If you have any dental work done, tell your dentist you are receiving this medication. Talk to your care team if you may be pregnant. Serious birth defects can occur if you take this medication during pregnancy. Talk to your care team before breastfeeding. Changes to your treatment plan may be needed. What side effects may I notice from receiving this medication? Side effects that you should report to your care team as soon as possible: Allergic reactions--skin rash, itching, hives, swelling of the face, lips, tongue, or throat Heart rhythm changes--fast or irregular heartbeat, dizziness, feeling faint or lightheaded, chest pain, trouble breathing Increase in blood pressure Infection--fever, chills, cough, sore throat, wounds that don't heal, pain or trouble when passing urine, general feeling of discomfort or being unwell Low blood pressure--dizziness, feeling faint or lightheaded, blurry vision Low red blood cell level--unusual weakness or fatigue, dizziness, headache, trouble breathing Painful swelling, warmth, or redness of the  skin, blisters or sores at the infusion site Pain, tingling, or numbness in the hands or feet Slow heartbeat--dizziness, feeling faint or lightheaded, confusion, trouble breathing, unusual weakness or fatigue Unusual bruising or bleeding Side effects that usually do not require medical attention (report to your care team if they continue or are bothersome): Diarrhea Hair loss Joint pain Loss of appetite Muscle pain Nausea Vomiting This list may not describe all possible side effects. Call your doctor for medical advice about side effects. You may report side effects to FDA at 1-800-FDA-1088. Where  should I keep my medication? This medication is given in a hospital or clinic. It will not be stored at home. NOTE: This sheet is a summary. It may not cover all possible information. If you have questions about this medicine, talk to your doctor, pharmacist, or health care provider.  2024 Elsevier/Gold Standard (2021-05-25 00:00:00)  Carboplatin  Injection What is this medication? CARBOPLATIN  (KAR boe pla tin) treats some types of cancer. It works by slowing down the growth of cancer cells. This medicine may be used for other purposes; ask your health care provider or pharmacist if you have questions. COMMON BRAND NAME(S): Paraplatin  What should I tell my care team before I take this medication? They need to know if you have any of these conditions: Blood disorders Hearing problems Kidney disease Recent or ongoing radiation therapy An unusual or allergic reaction to carboplatin , cisplatin, other medications, foods, dyes, or preservatives Pregnant or trying to get pregnant Breast-feeding How should I use this medication? This medication is injected into a vein. It is given by your care team in a hospital or clinic setting. Talk to your care team about the use of this medication in children. Special care may be needed. Overdosage: If you think you have taken too much of this medicine contact a poison control center or emergency room at once. NOTE: This medicine is only for you. Do not share this medicine with others. What if I miss a dose? Keep appointments for follow-up doses. It is important not to miss your dose. Call your care team if you are unable to keep an appointment. What may interact with this medication? Medications for seizures Some antibiotics, such as amikacin, gentamicin, neomycin, streptomycin, tobramycin Vaccines This list may not describe all possible interactions. Give your health care provider a list of all the medicines, herbs, non-prescription drugs, or dietary  supplements you use. Also tell them if you smoke, drink alcohol, or use illegal drugs. Some items may interact with your medicine. What should I watch for while using this medication? Your condition will be monitored carefully while you are receiving this medication. You may need blood work while taking this medication. This medication may make you feel generally unwell. This is not uncommon, as chemotherapy can affect healthy cells as well as cancer cells. Report any side effects. Continue your course of treatment even though you feel ill unless your care team tells you to stop. In some cases, you may be given additional medications to help with side effects. Follow all directions for their use. This medication may increase your risk of getting an infection. Call your care team for advice if you get a fever, chills, sore throat, or other symptoms of a cold or flu. Do not treat yourself. Try to avoid being around people who are sick. Avoid taking medications that contain aspirin , acetaminophen , ibuprofen , naproxen , or ketoprofen unless instructed by your care team. These medications may hide a fever. Be careful brushing or  flossing your teeth or using a toothpick because you may get an infection or bleed more easily. If you have any dental work done, tell your dentist you are receiving this medication. Talk to your care team if you wish to become pregnant or think you might be pregnant. This medication can cause serious birth defects. Talk to your care team about effective forms of contraception. Do not breast-feed while taking this medication. What side effects may I notice from receiving this medication? Side effects that you should report to your care team as soon as possible: Allergic reactions--skin rash, itching, hives, swelling of the face, lips, tongue, or throat Infection--fever, chills, cough, sore throat, wounds that don't heal, pain or trouble when passing urine, general feeling of  discomfort or being unwell Low red blood cell level--unusual weakness or fatigue, dizziness, headache, trouble breathing Pain, tingling, or numbness in the hands or feet, muscle weakness, change in vision, confusion or trouble speaking, loss of balance or coordination, trouble walking, seizures Unusual bruising or bleeding Side effects that usually do not require medical attention (report to your care team if they continue or are bothersome): Hair loss Nausea Unusual weakness or fatigue Vomiting This list may not describe all possible side effects. Call your doctor for medical advice about side effects. You may report side effects to FDA at 1-800-FDA-1088. Where should I keep my medication? This medication is given in a hospital or clinic. It will not be stored at home. NOTE: This sheet is a summary. It may not cover all possible information. If you have questions about this medicine, talk to your doctor, pharmacist, or health care provider.  2024 Elsevier/Gold Standard (2021-04-27 00:00:00)

## 2023-08-01 NOTE — Progress Notes (Signed)
 Palliative Medicine [facility]  [phone] [phone]   Name: [name] Date: 08/01/2023 MRN: [id number]  DOB: 15-Nov-1945  Patient Care Team: [name], MD as PCP - Diedre Wonda Sharper, MD as PCP - Cardiology (Cardiology) Tobie Tonita POUR, DO as Consulting Physician (Neurology) Lelon Glendia ONEIDA DEVONNA as Physician Assistant (Cardiology) Joane Artist RAMAN, MD as Consulting Physician (Family Medicine) Prentis Duwaine BROCKS, RN as Oncology Nurse Navigator Pickenpack-Cousar, Fannie SAILOR, NP as Nurse Practitioner [facility])   I connected with [name] on 07/10/23 at  9:30 AM EDT by Telephone and verified that I am speaking with the correct person using two identifiers.   I discussed the limitations, risks, security and privacy concerns of performing an evaluation and management service by telemedicine and the availability of in-person appointments. I also discussed with the patient that there may be a patient responsible charge related to this service. The patient expressed understanding and agreed to proceed.   Other persons participating in the visit and their role in the encounter: Daughter, [name]    Patient's location: Home   Provider's location: [facility]   INTERVAL HISTORY: [name] is a 78 y.o. female with  oncologic medical history including stage IIIB non-small cell lung cancer (03/2023) s/p concurrent chemoradiation with plans for immunotherapy starting 07/03/23.  Palliative is seeing patient for symptom management and goals of care.   SOCIAL HISTORY:     reports that she quit smoking about 37 years ago. Her smoking use included cigarettes. She started smoking about 67 years ago. She has never used smokeless tobacco. She reports that she does not drink alcohol and does not use drugs.  ADVANCE DIRECTIVES:  Completed on 06/26/23. Daughter [name] is designated HCPOA.   CODE STATUS: DNR  PAST MEDICAL  HISTORY: Past Medical History:  Diagnosis Date   AAA (abdominal aortic aneurysm) (HCC)    a. s/p stent graft repair 01/2016.   Acute ischemic colitis (HCC) 07/29/2010   Diagnosed June, 2012 characterized by acute lower GI bleeding, spontaneously resolved.    Acute respiratory infection 06/09/2014   Acute sinus infection 05/14/2015   Allergic rhinitis, cause unspecified    Angioedema of lips 06/03/2014   Anxiety state, unspecified    Arthritis of left hip 04/16/2015   Bilateral hearing loss 07/19/2017   Chronic LBP    Coronary artery disease    a. inferior MI 1998 s/p PCI of RCA. b. stenting of Cx 04/2010. c. DES to prox LAD 05/2013. d. DES x 3 in 10/2015. // Myoview  07/2018:  EF 59, normal perfusion, low risk    Coronary artery disease involving native heart without angina pectoris 07/31/2006   Qualifier: Diagnosis of  By: Harlow MD, Sharper BRAVO  ANGIOGRAPHIC DATA:  1. Ventriculography done in the RAO projection reveals a small wall  motion abnormality in the mid inferior wall. Ejection fraction  would be estimated at around 50%.  2. The right coronary artery has some progressive disease of about 60-  80% in the proximal mid segment. The distal vessel appears to be  50-70% narrowing althou   Degeneration of lumbar or lumbosacral intervertebral disc    Depressive disorder, not elsewhere classified    Diabetes (HCC) 11/03/2006   Qualifier: Diagnosis of  By: [name] MD, Lynwood ORN    Diabetes mellitus    TYPE II   Frequent urination 05/08/2014   Gout 08/22/2013   History of endovascular stent graft for abdominal  aortic aneurysm (AAA) 01/22/2016   History of radiation therapy    Right lung-04/19/23-05/29/23-Dr. [name]   Hyperlipidemia    Hypertension    Hypothyroidism    Iron  deficiency anemia 06/13/2017   Lower GI bleed 06/2010   Diverticular bleed   Lumbar radiculopathy 05/15/2015   MENOPAUSAL DISORDER 10/29/2007   Qualifier: Diagnosis of  By: [name] MD, Lynwood ORN    Myalgia 09/25/2013    Noncompliance with medications 02/26/2014   Obesity, unspecified    OTITIS MEDIA, SEROUS, CHRONIC 04/23/2007   Qualifier: Diagnosis of  By: Harlow MD, Ozell BRAVO    Thoracic disc disease 11/19/2018   URTICARIA 09/23/2009   Qualifier: Diagnosis of  By: Inocencio MD, Berwyn A    Venous insufficiency 07/19/2017    ALLERGIES:  is allergic to crestor  [rosuvastatin  calcium ], prilosec [omeprazole ], miconazole nitrate, augmentin  [amoxicillin -pot clavulanate], and doxycycline .  MEDICATIONS:  Current Outpatient Medications  Medication Sig Dispense Refill   alendronate  (FOSAMAX ) 70 MG tablet Take 1 tablet (70 mg total) by mouth every 7 (seven) days. Take with a full glass of water on an empty stomach. 12 tablet 3   aspirin  81 MG EC tablet Take 81 mg by mouth daily.      atenolol  (TENORMIN ) 25 MG tablet Take 0.5 tablets (12.5 mg total) by mouth daily. 90 tablet 0   Blood Pressure Monitoring (OMRON 3 SERIES BP MONITOR) DEVI Use as directed. 1 each 0   Foot Care Products ([employer] GEL HEEL CUSHION WOMENS) PADS Use as directed at bedtime 2 each 5   gabapentin  (NEURONTIN ) 300 MG capsule Take 1-2 tab by mouth at bedtime for pain and sleep (Patient taking differently: Take 300 mg by mouth as needed. Take 1-2 tab by mouth at bedtime for pain and sleep) 180 capsule 1   levothyroxine  (SYNTHROID ) 100 MCG tablet Take 1 tablet (100 mcg total) by mouth daily before breakfast. 90 tablet 3   lidocaine -prilocaine  (EMLA ) cream Apply 1 Application topically as needed. 30 g 2   methylPREDNISolone  (MEDROL  DOSEPAK) 4 MG TBPK tablet Taper per pharmacy 21 tablet 0   oxyCODONE  (OXY IR/ROXICODONE ) 5 MG immediate release tablet Take 1-2 tablets (5-10 mg total) by mouth every 4 (four) hours as needed for severe pain (pain score 7-10). 60 tablet 0   prochlorperazine  (COMPAZINE ) 10 MG tablet Take 1 tablet (10 mg total) by mouth every 6 (six) hours as needed. 30 tablet 2   temazepam  (RESTORIL ) 15 MG capsule Take 1 capsule (15 mg total)  by mouth at bedtime as needed for sleep. 30 capsule 2   Triamcinolone  Acetonide (ZILRETTA ) 32 MG SRER intra-articular injection 5 mL, intra-articular, bilat knees 2 each 0   No current facility-administered medications for this visit.   Facility-Administered Medications Ordered in Other Visits  Medication Dose Route Frequency Provider Last Rate Last Admin   0.9 %  sodium chloride  infusion   Intravenous Continuous Sherrod Sherrod, MD   Stopped at 08/01/23 1117    VITAL SIGNS: There were no vitals taken for this visit. There were no vitals filed for this visit.  Estimated body mass index is 28.08 kg/m as calculated from the following:   Height as of an earlier encounter on 08/01/23: 5' 6 (1.676 m).   Weight as of an earlier encounter on 08/01/23: 174 lb (78.9 kg).   PERFORMANCE STATUS (ECOG) : 1 - Symptomatic but completely ambulatory  IMPRESSION: Discussed the use of AI scribe software for clinical note transcription with the patient, who gave verbal consent to proceed.  History of Present Illness Mrs. [name] is a 78 year old female with non-small cell lung cancer currently undergoing immunotherapy treatments seen during her infusion for symptom management follow-up. No acute distress. Deneis concerns with nausea, vomiting, or diarrhea. Occasional fatigue. Appetite is fair.   She is experiencing sleep disturbances, describing her sleep as being awake 'twenty four hours' in the past. Patient has Restoril  ordered in the past. Encouraged to continue or consider melatonin 30-45 min prior to bedtime.   Patient reports occasional constipation. She is not currently taking stool softener. Patient confirms she has Miralax  on hand in the home. Recommend daily use of Miralax  if no improvement instructed to increase to twice daily use. She verbalized understanding.   We discussed her pain at length. [name] reports pain is slowly improving. Some days are better than others. She is taking  oxycodone  5mg  as needed. States this controls her pain however on some days pain is more intense. She is not requiring daily use of oxycodone . No adjustments to regimen at this time.   All questions answered and support provided.   Goals of Care 06/26/23: We discussed her current illness and what it means in the larger context of her on-going co-morbidities. Natural disease trajectory and expectations were discussed. Patient and her daughter are realistic in their understanding of patient's overall condition and cancer state.    I empathetically approached discussions regarding healthcare limitations, code status, and advanced directives. Patient does not have advanced directives however reports interest in completing. Extensive education provided on AD documents. Patient would like to complete at advanced directive clinic today. She expresses wishes to name her daughter, [name] as her medical decision maker in the event she is unable to speak for herself, desires a natural death with no CPR or life-sustaining measures in a terminal or comatose state. Open to artificial feedings for trial if condition is reversible.    [name] is clear in her expressed wishes to continue to treat the treatable allowing her every opportunity to continue thriving while managing her symptoms to minimize discomfort.   We discussed the patient's current illness and what it means in the larger context of their on-going co-morbidities. Natural disease trajectory and expectations were discussed.  I discussed the importance of continued conversation with family and their medical providers regarding overall plan of care and treatment options, ensuring decisions are within the context of the patients values and GOCs.  Assessment & Plan  Cancer Related Pain management Chronic right-sided pain managed effectively with oxycodone . No adjustments to regimen.  - Continue oxycodone  5 mg, 1-2 tablets as needed every 4-6hrs for  pain. - Instruct to contact when down to 2-3 days' supply for refill.   Constipation Intermittent constipation likely due to opioid use. Miralax  recommended for its gentle effect. - Instruct to take Miralax  once daily, increase to twice daily if needed. - Consider alternative if Miralax  is ineffective.   Sleep disturbance Difficulty sleeping at night. Temazepam  will be tried first for sleep issues. - Instruct to try temazepam  15 mg at bedtime for sleep as previously prescribed.    Goals of Care Discussed advanced directives and goals of care. She desires a natural death without life-sustaining measures. - Completed advanced directives paperwork at AD clinic. Documents filed. - Daughter is designated as Midwife.   I will plan to follow-up with patient in 2-3 weeks. Sooner if needed.    Patient expressed understanding and was in agreement with this plan. She also understands  that She can call the clinic at any time with any questions, concerns, or complaints.   Any controlled substances utilized were prescribed in the context of palliative care. PDMP has been reviewed.    Visit consisted of counseling and education dealing with the complex and emotionally intense issues of symptom management and palliative care in the setting of serious and potentially life-threatening illness.  Levon Borer, AGPCNP-BC  Palliative Medicine Team/[facility]

## 2023-08-07 ENCOUNTER — Telehealth: Payer: Self-pay

## 2023-08-07 NOTE — Telephone Encounter (Unsigned)
 Copied from CRM [id number]. Topic: Clinical - Medication Question >> Aug 07, 2023  4:03 PM Avram MATSU wrote: Reason for CRM: Lauraine is calling from [facility] and was wondering if the PT should be on levothyroxine  (SYNTHROID ) 100 MCG tablet [[id number]] ENDED. Please  give sarah a callback about what medication dose the pt should be on.  Phone:[phone] [phone]

## 2023-08-08 MED ORDER — LEVOTHYROXINE SODIUM 100 MCG PO TABS: 100.0000 ug | ORAL_TABLET | Freq: Every day | ORAL | 3 refills | Status: DC

## 2023-08-08 NOTE — Addendum Note (Signed)
 Addended by: [name] on: 08/08/2023 03:45 PM   Modules accepted: Orders

## 2023-08-09 ENCOUNTER — Ambulatory Visit (INDEPENDENT_AMBULATORY_CARE_PROVIDER_SITE_OTHER): Admitting: Internal Medicine

## 2023-08-09 ENCOUNTER — Encounter: Payer: Self-pay | Admitting: Internal Medicine

## 2023-08-09 VITALS — BP 116/70 | HR 86 | Temp 97.9°F | Ht 66.0 in | Wt 174.4 lb

## 2023-08-09 DIAGNOSIS — E559 Vitamin D deficiency, unspecified: Secondary | ICD-10-CM

## 2023-08-09 DIAGNOSIS — T7840XA Allergy, unspecified, initial encounter: Secondary | ICD-10-CM | POA: Diagnosis not present

## 2023-08-09 DIAGNOSIS — R52 Pain, unspecified: Secondary | ICD-10-CM

## 2023-08-09 DIAGNOSIS — G8929 Other chronic pain: Secondary | ICD-10-CM | POA: Diagnosis not present

## 2023-08-09 DIAGNOSIS — M545 Low back pain, unspecified: Secondary | ICD-10-CM

## 2023-08-09 DIAGNOSIS — J309 Allergic rhinitis, unspecified: Secondary | ICD-10-CM | POA: Diagnosis not present

## 2023-08-09 DIAGNOSIS — E039 Hypothyroidism, unspecified: Secondary | ICD-10-CM

## 2023-08-09 DIAGNOSIS — K5900 Constipation, unspecified: Secondary | ICD-10-CM | POA: Diagnosis not present

## 2023-08-09 MED ORDER — METHYLPREDNISOLONE ACETATE 40 MG/ML IJ SUSP
40.0000 mg | Freq: Once | INTRAMUSCULAR | Status: AC
  Administered 2023-08-09: 40 mg via INTRAMUSCULAR

## 2023-08-09 MED ORDER — POLYETHYLENE GLYCOL 3350 17 GM/SCOOP PO POWD: 17.0000 g | Freq: Two times a day (BID) | ORAL | 1 refills | Status: DC | PRN

## 2023-08-09 MED ORDER — SYNTHROID 150 MCG PO TABS: 150.0000 ug | ORAL_TABLET | Freq: Every day | ORAL | 11 refills | Status: DC

## 2023-08-09 MED ORDER — KETOROLAC TROMETHAMINE 30 MG/ML IJ SOLN
30.0000 mg | Freq: Once | INTRAMUSCULAR | Status: AC
  Administered 2023-08-09: 30 mg via INTRAMUSCULAR

## 2023-08-09 NOTE — Assessment & Plan Note (Signed)
Mild to mod, for depomedrol 80 mg IM, to f/u any worsening symptoms or concerns 

## 2023-08-09 NOTE — Assessment & Plan Note (Signed)
 Mild to mod flare, for toradol  30 mg IM,  to f/u any worsening symptoms or concerns

## 2023-08-09 NOTE — Patient Instructions (Signed)
 You had the steroid shot today, and the pain shot (toradol )  Please continue all other medications as before, including to restart the Synthroid  150 mcg per day  Please have the pharmacy call with any other refills you may need.  Please continue your efforts at being more active, low cholesterol diet, and weight control.  Please keep your appointments with your specialists as you may have planned  Please make an Appointment to return in 6 months, or sooner if needed

## 2023-08-09 NOTE — Progress Notes (Addendum)
 Patient ID: [name], female   DOB: 06-30-1945, 78 y.o.   MRN: [id number]        Chief Complaint: follow up fatigue, allergies, arthritis, constipation, recent lung cancer dx       HPI:  [name] is a 78 y.o. female here with daughter, c/o fatigue with chemo infusions for lung ca, has port left upper chest.  Does have several wks ongoing nasal allergy symptoms with clearish congestion, itch and sneezing, without fever, pain, ST, cough, swelling or wheezing.  Has ongoing arthritic pain, asking for toradol  today for flare.  Denies worsening reflux, abd pain, dysphagia, n/v or blood but has mild intermittent constipation.  Also needs brand name synthroid  150 as this is what her endo has done, but not seen lately.        Wt Readings from Last 3 Encounters:  08/09/23 174 lb 6 oz (79.1 kg)  08/01/23 174 lb (78.9 kg)  07/03/23 169 lb 12 oz (77 kg)   BP Readings from Last 3 Encounters:  08/09/23 116/70  08/01/23 (!) 97/57  07/17/23 131/66         Past Medical History:  Diagnosis Date   AAA (abdominal aortic aneurysm) (HCC)    a. s/p stent graft repair 01/2016.   Acute ischemic colitis (HCC) 07/29/2010   Diagnosed June, 2012 characterized by acute lower GI bleeding, spontaneously resolved.    Acute respiratory infection 06/09/2014   Acute sinus infection 05/14/2015   Allergic rhinitis, cause unspecified    Angioedema of lips 06/03/2014   Anxiety state, unspecified    Arthritis of left hip 04/16/2015   Bilateral hearing loss 07/19/2017   Chronic LBP    Coronary artery disease    a. inferior MI 1998 s/p PCI of RCA. b. stenting of Cx 04/2010. c. DES to prox LAD 05/2013. d. DES x 3 in 10/2015. // Myoview  07/2018:  EF 59, normal perfusion, low risk    Coronary artery disease involving native heart without angina pectoris 07/31/2006   Qualifier: Diagnosis of  By: Harlow MD, Ozell BRAVO  ANGIOGRAPHIC DATA:  1. Ventriculography done in the RAO projection reveals a small wall  motion abnormality  in the mid inferior wall. Ejection fraction  would be estimated at around 50%.  2. The right coronary artery has some progressive disease of about 60-  80% in the proximal mid segment. The distal vessel appears to be  50-70% narrowing althou   Degeneration of lumbar or lumbosacral intervertebral disc    Depressive disorder, not elsewhere classified    Diabetes (HCC) 11/03/2006   Qualifier: Diagnosis of  By: [name] MD, Lynwood ORN    Diabetes mellitus    TYPE II   Frequent urination 05/08/2014   Gout 08/22/2013   History of endovascular stent graft for abdominal aortic aneurysm (AAA) 01/22/2016   History of radiation therapy    Right lung-04/19/23-05/29/23-Dr. [name]   Hyperlipidemia    Hypertension    Hypothyroidism    Iron  deficiency anemia 06/13/2017   Lower GI bleed 06/2010   Diverticular bleed   Lumbar radiculopathy 05/15/2015   MENOPAUSAL DISORDER 10/29/2007   Qualifier: Diagnosis of  By: [name] MD, Lynwood ORN    Myalgia 09/25/2013   Noncompliance with medications 02/26/2014   Obesity, unspecified    OTITIS MEDIA, SEROUS, CHRONIC 04/23/2007   Qualifier: Diagnosis of  By: Harlow MD, Ozell BRAVO    Thoracic disc disease 11/19/2018   URTICARIA 09/23/2009   Qualifier: Diagnosis of  By: Inocencio MD,  Berwyn A    Venous insufficiency 07/19/2017   Past Surgical History:  Procedure Laterality Date   CARDIAC CATHETERIZATION     PCI OF BOTH THE CIRCUMFLEX AND LEFT ANTERIOR DESCENDING ARTERY   CARDIAC CATHETERIZATION N/A 10/29/2015   Procedure: Coronary Stent Intervention;  Surgeon: Lonni Hanson, MD;  Location: [facility] INVASIVE CV LAB;  Service: Cardiovascular;  Laterality: N/A;   CARDIAC CATHETERIZATION N/A 10/29/2015   Procedure: Coronary/Graft Angiography;  Surgeon: Lonni Hanson, MD;  Location: [facility] INVASIVE CV LAB;  Service: Cardiovascular;  Laterality: N/A;   CARDIAC CATHETERIZATION N/A 10/29/2015   Procedure: Intravascular Pressure Wire/FFR Study;  Surgeon: Lonni Hanson, MD;   Location: [facility] INVASIVE CV LAB;  Service: Cardiovascular;  Laterality: N/A;   CESAREAN SECTION     ENDOVASCULAR STENT INSERTION N/A 01/22/2016   Procedure: ABDOMINAL AORTIC ENDOVASCULAR STENT GRAFT INSERTION;  Surgeon: Krystal JULIANNA Doing, MD;  Location: [facility] OR;  Service: Vascular;  Laterality: N/A;   HEART STENT  04-2010  and  Jun 07, 2013   X 3   IR EMBO ARTERIAL NOT HEMORR HEMANG INC GUIDE ROADMAPPING  07/17/2023   IR IMAGING GUIDED PORT INSERTION  05/17/2023   IR RADIOLOGIST EVAL & MGMT  06/21/2023   LEFT HEART CATH AND CORONARY ANGIOGRAPHY N/A 04/03/2019   Procedure: LEFT HEART CATH AND CORONARY ANGIOGRAPHY;  Surgeon: Hanson Lonni, MD;  Location: [facility] INVASIVE CV LAB;  Service: Cardiovascular;  Laterality: N/A;   LEFT HEART CATHETERIZATION WITH CORONARY ANGIOGRAM N/A 06/07/2013   Procedure: LEFT HEART CATHETERIZATION WITH CORONARY ANGIOGRAM;  Surgeon: Lonni JONETTA Cash, MD;  Location: [facility] CATH LAB;  Service: Cardiovascular;  Laterality: N/A;   LEFT HEART CATHETERIZATION WITH CORONARY ANGIOGRAM N/A 02/25/2014   Procedure: LEFT HEART CATHETERIZATION WITH CORONARY ANGIOGRAM;  Surgeon: Debby DELENA Sor, MD;  Location: [facility] CATH LAB;  Service: Cardiovascular;  Laterality: N/A;   LUMBAR FUSION  01/2007   DR. GUST...3-LEVEL WITH FIXATION   OOPHORECTOMY     BSO? pt.unsure   PARATHYROIDECTOMY     SPINE SURGERY     THYROIDECTOMY     TOTAL ABDOMINAL HYSTERECTOMY      reports that she quit smoking about 37 years ago. Her smoking use included cigarettes. She started smoking about 67 years ago. She has never used smokeless tobacco. She reports that she does not drink alcohol and does not use drugs. family history includes Arthritis in her maternal aunt; Breast cancer in her maternal aunt; Diabetes in her brother and maternal aunt; Heart attack (age of onset: 39) in her mother; Heart attack (age of onset: 33) in her father; Heart disease in her father and mother. Allergies  Allergen Reactions   Crestor  [Rosuvastatin   Calcium ] Other (See Comments)    Feeling poor   Prilosec [Omeprazole ] Other (See Comments)    Chest pain   Miconazole Nitrate Hives    REACTION: hives   Augmentin  [Amoxicillin -Pot Clavulanate] Hives, Itching and Rash    Has patient had a PCN reaction causing immediate rash, facial/tongue/throat swelling, SOB or lightheadedness with hypotension:unsure Has patient had a PCN reaction causing severe rash involving mucus membranes or skin necrosis:unsure Has patient had a PCN reaction that required hospitalization:No Has patient had a PCN reaction occurring within the last 10 years:NO If all of the above answers are NO, then may proceed with Cephalosporin use. Has patient had a PCN reaction causing immediate rash, facial/tongue/throat swelling, SOB or lightheadedness with hypotension:unsure Has patient had a PCN reaction causing severe rash involving mucus membranes or skin necrosis:unsure Has patient  had a PCN reaction that required hospitalization:No Has patient had a PCN reaction occurring within the last 10 years:NO If all of the above answers are NO, then may proceed with Cephalosporin use.    Doxycycline  Other (See Comments)    REACTION: gi upset   Current Outpatient Medications on File Prior to Visit  Medication Sig Dispense Refill   alendronate  (FOSAMAX ) 70 MG tablet Take 1 tablet (70 mg total) by mouth every 7 (seven) days. Take with a full glass of water on an empty stomach. 12 tablet 3   aspirin  81 MG EC tablet Take 81 mg by mouth daily.      atenolol  (TENORMIN ) 25 MG tablet Take 0.5 tablets (12.5 mg total) by mouth daily. 90 tablet 0   Blood Pressure Monitoring (OMRON 3 SERIES BP MONITOR) DEVI Use as directed. 1 each 0   Foot Care Products ([employer] GEL HEEL CUSHION WOMENS) PADS Use as directed at bedtime 2 each 5   gabapentin  (NEURONTIN ) 300 MG capsule Take 1-2 tab by mouth at bedtime for pain and sleep (Patient taking differently: Take 300 mg by mouth as needed. Take 1-2 tab by  mouth at bedtime for pain and sleep) 180 capsule 1   lidocaine -prilocaine  (EMLA ) cream Apply 1 Application topically as needed. 30 g 2   methylPREDNISolone  (MEDROL  DOSEPAK) 4 MG TBPK tablet Taper per pharmacy 21 tablet 0   oxyCODONE  (OXY IR/ROXICODONE ) 5 MG immediate release tablet Take 1-2 tablets (5-10 mg total) by mouth every 4 (four) hours as needed for severe pain (pain score 7-10). 60 tablet 0   prochlorperazine  (COMPAZINE ) 10 MG tablet Take 1 tablet (10 mg total) by mouth every 6 (six) hours as needed. 30 tablet 2   temazepam  (RESTORIL ) 15 MG capsule Take 1 capsule (15 mg total) by mouth at bedtime as needed for sleep. 30 capsule 2   Triamcinolone  Acetonide (ZILRETTA ) 32 MG SRER intra-articular injection 5 mL, intra-articular, bilat knees 2 each 0   No current facility-administered medications on file prior to visit.        ROS:  All others reviewed and negative.  Objective        PE:  BP 116/70   Pulse 86   Temp 97.9 F (36.6 C) (Temporal)   Ht 5' 6 (1.676 m)   Wt 174 lb 6 oz (79.1 kg)   SpO2 94%   BMI 28.14 kg/m                 Constitutional: Pt appears in NAD               HENT: Head: NCAT.                Right Ear: External ear normal.                 Left Ear: External ear normal.                Eyes: . Pupils are equal, round, and reactive to light. Conjunctivae and EOM are normal               Nose: without d/c or deformity               Neck: Neck supple. Gross normal ROM               Cardiovascular: Normal rate and regular rhythm.                 Pulmonary/Chest: Effort normal and breath  sounds without rales or wheezing.                Abd:  Soft, NT, ND, + BS, no organomegaly               Neurological: Pt is alert. At baseline orientation, motor grossly intact               Skin: Skin is warm. No rashes, no other new lesions, LE edema - none               Psychiatric: Pt behavior is normal without agitation   Micro: none  Cardiac tracings I have personally  interpreted today:  none  Pertinent Radiological findings (summarize): none   Lab Results  Component Value Date   WBC 6.3 08/01/2023   HGB 12.5 08/01/2023   HCT 38.9 08/01/2023   PLT 265 08/01/2023   GLUCOSE 76 08/01/2023   CHOL 119 03/22/2023   TRIG 62 03/22/2023   HDL 22 (L) 03/22/2023   LDLDIRECT 131.0 03/27/2015   LDLCALC 85 03/22/2023   ALT 5 08/01/2023   AST 11 (L) 08/01/2023   NA 141 08/01/2023   K 3.9 08/01/2023   CL 105 08/01/2023   CREATININE 0.69 08/01/2023   BUN 7 (L) 08/01/2023   CO2 30 08/01/2023   TSH 3.910 07/03/2023   INR 1.2 03/27/2023   HGBA1C 5.5 02/21/2023   MICROALBUR 2.0 (H) 11/06/2009   Assessment/Plan:  [name] is a 78 y.o. Black or African American [2] female with  has a past medical history of AAA (abdominal aortic aneurysm) (HCC), Acute ischemic colitis (HCC) (07/29/2010), Acute respiratory infection (06/09/2014), Acute sinus infection (05/14/2015), Allergic rhinitis, cause unspecified, Angioedema of lips (06/03/2014), Anxiety state, unspecified, Arthritis of left hip (04/16/2015), Bilateral hearing loss (07/19/2017), Chronic LBP, Coronary artery disease, Coronary artery disease involving native heart without angina pectoris (07/31/2006), Degeneration of lumbar or lumbosacral intervertebral disc, Depressive disorder, not elsewhere classified, Diabetes (HCC) (11/03/2006), Diabetes mellitus, Frequent urination (05/08/2014), Gout (08/22/2013), History of endovascular stent graft for abdominal aortic aneurysm (AAA) (01/22/2016), History of radiation therapy, Hyperlipidemia, Hypertension, Hypothyroidism, Iron  deficiency anemia (06/13/2017), Lower GI bleed (06/2010), Lumbar radiculopathy (05/15/2015), MENOPAUSAL DISORDER (10/29/2007), Myalgia (09/25/2013), Noncompliance with medications (02/26/2014), Obesity, unspecified, OTITIS MEDIA, SEROUS, CHRONIC (04/23/2007), Thoracic disc disease (11/19/2018), URTICARIA (09/23/2009), and Venous insufficiency  (07/19/2017).  Chronic low back pain Mild to mod flare, for toradol  30 mg IM,  to f/u any worsening symptoms or concerns  Hypothyroidism Lab Results  Component Value Date   TSH 3.910 07/03/2023   Stable, pt to continue levothyroxine  150 mcg Branded  Vitamin D  deficiency Last vitamin D  Lab Results  Component Value Date   VD25OH 7.68 (L) 08/04/2022   Low, to start oral replacement    Allergic rhinitis Mild to mod, for depomedrol 80 mg IM,  to f/u any worsening symptoms or concerns  Constipation Ok for miralax  every day prn  Followup: Return in about 6 months (around 02/09/2024).  Lynwood Rush, MD 08/09/2023 7:58 PM [facility]

## 2023-08-09 NOTE — Addendum Note (Signed)
 Addended by: [name] on: 08/09/2023 07:59 PM   Modules accepted: Level of Service

## 2023-08-09 NOTE — Assessment & Plan Note (Signed)
 Lab Results  Component Value Date   TSH 3.910 07/03/2023   Stable, pt to continue levothyroxine  150 mcg Branded

## 2023-08-09 NOTE — Assessment & Plan Note (Signed)
Last vitamin D Lab Results  Component Value Date   VD25OH 7.68 (L) 08/04/2022   Low, to start oral replacement

## 2023-08-09 NOTE — Assessment & Plan Note (Signed)
 Ok for miralax  every day prn

## 2023-08-10 NOTE — Telephone Encounter (Signed)
 Pt was seen by provider on 08/09/23 for this issue

## 2023-08-14 ENCOUNTER — Encounter: Payer: Self-pay | Admitting: Internal Medicine

## 2023-08-16 ENCOUNTER — Ambulatory Visit: Payer: Self-pay

## 2023-08-16 NOTE — Telephone Encounter (Signed)
 FYI Only or Action Required?: Action required by provider: requesting medication.  Patient was last seen in primary care on 08/09/2023 by [name], MD.  Called Nurse Triage reporting Sore Throat and Otalgia.  Symptoms began yesterday.  Interventions attempted: OTC medications: Tylenol .  Symptoms are: gradually worsening.  Triage Disposition: See HCP Within 4 Hours (Or PCP Triage)  Patient/caregiver understands and will follow disposition?: No, refuses disposition                             Reason for Disposition  [1] SEVERE pain (e.g., excruciating) and [2] not improved 2 hours after pain medicine (e.g., acetaminophen  or ibuprofen )  Answer Assessment - Initial Assessment Questions 1. LOCATION: Which ear is involved?     Right ear is worse than left  2. ONSET: When did the ear pain start?      Yesterday 3. SEVERITY: How bad is the pain?  (Scale 1-10; mild, moderate or severe)     My ear is throbbing, rates pain a 10 in right ear 4. URI SYMPTOMS: Do you have a runny nose or cough?     Sore throat 5. FEVER: Do you have a fever? If Yes, ask: What is your temperature, how was it measured, and when did it start?     Not now 6. CAUSE: Have you been swimming recently?, How often do you use Q-TIPS?, Have you had any recent air travel or scuba diving?     States she slept under her fan last night 7. OTHER SYMPTOMS: Do you have any other symptoms? (e.g., decreased hearing, dizziness, headache, stiff neck, vomiting)     Denies difficulty breathing, denies difficulty swallowing, denies headache    Patient requesting erythromycin. Advised appointment in office. Patient declined and stated her provider has sent this medication in for her in the past. Please advise.  Protocols used: [place]illa

## 2023-08-16 NOTE — Telephone Encounter (Signed)
 This RN made first attempt to triage patient. No answer, LVM. Routing for additional attempts.   Copied from CRM [id number]. Topic: Clinical - Medication Question >> Aug 16, 2023  8:41 AM [name] wrote: Reason for CRM: Pt called in because she stated that has sore throat and earache and is requesting a prescription for Azithromycin  250 mg (6 pack)- as she was prescribed this before by Dr. [name] for this issue. Pt would like a callback with f/u.  Callback [phone]

## 2023-08-16 NOTE — Telephone Encounter (Signed)
 This RN made second attempt to triage patient. No answer, LVM. Routing for additional attempts.

## 2023-08-17 ENCOUNTER — Other Ambulatory Visit: Payer: Self-pay | Admitting: Internal Medicine

## 2023-08-17 MED ORDER — AZITHROMYCIN 250 MG PO TABS: ORAL_TABLET | ORAL | 1 refills | Status: AC

## 2023-08-17 MED ORDER — AZITHROMYCIN 250 MG PO TABS: ORAL_TABLET | ORAL | 1 refills | Status: DC

## 2023-08-17 NOTE — Telephone Encounter (Signed)
Ok for zpack - done erx 

## 2023-08-17 NOTE — Addendum Note (Signed)
 Addended by: [name] on: 08/17/2023 03:43 PM   Modules accepted: Orders

## 2023-08-22 ENCOUNTER — Telehealth: Payer: Self-pay

## 2023-08-22 NOTE — Telephone Encounter (Signed)
 Attempted to call patient but had to leave a voice message for call back. Wanted to check on status of symptoms and see how medication was doing

## 2023-08-22 NOTE — Telephone Encounter (Signed)
 Copied from CRM [id number]. Topic: General - Call Back - No Documentation >> Aug 22, 2023 11:57 AM [name] I wrote: Reason for CRM: Patient missed a call from Mr. Arthea. Patient would like a call back whenever he is back from lunch >> Aug 22, 2023  3:39 PM [name] wrote: Pt returning [place] call. Per notes, Arthea wanted to check stats of pt symptoms and to see how the medications were doing. Pt says she is doing fine and the medications are fine. Please call if anything else is needed.

## 2023-08-22 NOTE — Telephone Encounter (Signed)
 Attempted call back and had to leave a voice message. Will try to call again after last patient of the day

## 2023-08-22 NOTE — Telephone Encounter (Unsigned)
 Copied from CRM [id number]. Topic: General - Call Back - No Documentation >> Aug 22, 2023 11:57 AM [name] I wrote: Reason for CRM: Patient missed a call from Mr. Arthea. Patient would like a call back whenever he is back from lunch

## 2023-08-23 NOTE — Telephone Encounter (Signed)
 Acknowledged.

## 2023-08-23 NOTE — Telephone Encounter (Signed)
 Addressed in separate telephone note. Patient notes she is doing well and medications have been tolerated well.

## 2023-08-24 NOTE — Telephone Encounter (Unsigned)
 Copied from CRM [id number]. Topic: General - Call Back - No Documentation >> Aug 22, 2023 11:57 AM [name] I wrote: Reason for CRM: Patient missed a call from Mr. Arthea. Patient would like a call back whenever he is back from lunch >> Aug 23, 2023  5:41 PM Drema MATSU wrote: Patient is returning a call from [place]. She is requesting a callback on tomorrow. >> Aug 22, 2023  3:39 PM [name] wrote: Pt returning [place] call. Per notes, Arthea wanted to check stats of pt symptoms and to see how the medications were doing. Pt says she is doing fine and the medications are fine. Please call if anything else is needed.

## 2023-08-29 ENCOUNTER — Inpatient Hospital Stay (HOSPITAL_BASED_OUTPATIENT_CLINIC_OR_DEPARTMENT_OTHER): Admitting: Nurse Practitioner

## 2023-08-29 ENCOUNTER — Inpatient Hospital Stay (HOSPITAL_BASED_OUTPATIENT_CLINIC_OR_DEPARTMENT_OTHER): Admitting: Physician Assistant

## 2023-08-29 ENCOUNTER — Inpatient Hospital Stay: Attending: Internal Medicine

## 2023-08-29 ENCOUNTER — Inpatient Hospital Stay

## 2023-08-29 VITALS — BP 125/84 | HR 69 | Temp 98.1°F | Resp 13 | Wt 174.0 lb

## 2023-08-29 DIAGNOSIS — K5903 Drug induced constipation: Secondary | ICD-10-CM

## 2023-08-29 DIAGNOSIS — C3411 Malignant neoplasm of upper lobe, right bronchus or lung: Secondary | ICD-10-CM

## 2023-08-29 DIAGNOSIS — Z515 Encounter for palliative care: Secondary | ICD-10-CM

## 2023-08-29 DIAGNOSIS — C3490 Malignant neoplasm of unspecified part of unspecified bronchus or lung: Secondary | ICD-10-CM | POA: Diagnosis not present

## 2023-08-29 DIAGNOSIS — R530 Neoplastic (malignant) related fatigue: Secondary | ICD-10-CM | POA: Diagnosis not present

## 2023-08-29 DIAGNOSIS — Z79620 Long term (current) use of immunosuppressive biologic: Secondary | ICD-10-CM | POA: Diagnosis not present

## 2023-08-29 DIAGNOSIS — Z5112 Encounter for antineoplastic immunotherapy: Secondary | ICD-10-CM

## 2023-08-29 DIAGNOSIS — G893 Neoplasm related pain (acute) (chronic): Secondary | ICD-10-CM | POA: Diagnosis not present

## 2023-08-29 DIAGNOSIS — Z95828 Presence of other vascular implants and grafts: Secondary | ICD-10-CM

## 2023-08-29 LAB — CBC WITH DIFFERENTIAL (CANCER CENTER ONLY)
Abs Immature Granulocytes: 0.03 K/uL (ref 0.00–0.07)
Basophils Absolute: 0 K/uL (ref 0.0–0.1)
Basophils Relative: 0 %
Eosinophils Absolute: 0.1 K/uL (ref 0.0–0.5)
Eosinophils Relative: 2 %
HCT: 39.9 % (ref 36.0–46.0)
Hemoglobin: 12.8 g/dL (ref 12.0–15.0)
Immature Granulocytes: 1 %
Lymphocytes Relative: 23 %
Lymphs Abs: 1.2 K/uL (ref 0.7–4.0)
MCH: 30.2 pg (ref 26.0–34.0)
MCHC: 32.1 g/dL (ref 30.0–36.0)
MCV: 94.1 fL (ref 80.0–100.0)
Monocytes Absolute: 0.5 K/uL (ref 0.1–1.0)
Monocytes Relative: 10 %
Neutro Abs: 3.3 K/uL (ref 1.7–7.7)
Neutrophils Relative %: 64 %
Platelet Count: 214 K/uL (ref 150–400)
RBC: 4.24 MIL/uL (ref 3.87–5.11)
RDW: 14.5 % (ref 11.5–15.5)
WBC Count: 5.1 K/uL (ref 4.0–10.5)
nRBC: 0 % (ref 0.0–0.2)

## 2023-08-29 LAB — CMP (CANCER CENTER ONLY)
ALT: 6 U/L (ref 0–44)
AST: 12 U/L — ABNORMAL LOW (ref 15–41)
Albumin: 3.7 g/dL (ref 3.5–5.0)
Alkaline Phosphatase: 93 U/L (ref 38–126)
Anion gap: 7 (ref 5–15)
BUN: <5 mg/dL — ABNORMAL LOW (ref 8–23)
CO2: 31 mmol/L (ref 22–32)
Calcium: 8.4 mg/dL — ABNORMAL LOW (ref 8.9–10.3)
Chloride: 104 mmol/L (ref 98–111)
Creatinine: 0.57 mg/dL (ref 0.44–1.00)
GFR, Estimated: >60 mL/min (ref >60)
Glucose, Bld: 78 mg/dL (ref 70–99)
Potassium: 3.5 mmol/L (ref 3.5–5.1)
Sodium: 142 mmol/L (ref 135–145)
Total Bilirubin: 0.6 mg/dL (ref 0.0–1.2)
Total Protein: 7.3 g/dL (ref 6.5–8.1)

## 2023-08-29 LAB — TSH: TSH: 2.26 u[IU]/mL (ref 0.350–4.500)

## 2023-08-29 MED ORDER — SODIUM CHLORIDE 0.9 % IV SOLN
INTRAVENOUS | Status: DC
Start: 2023-08-29 — End: 2023-08-29

## 2023-08-29 MED ORDER — LIDOCAINE-PRILOCAINE 2.5-2.5 % EX CREA: 1.0000 | TOPICAL_CREAM | CUTANEOUS | 2 refills | Status: DC | PRN

## 2023-08-29 MED ORDER — SODIUM CHLORIDE 0.9 % IV SOLN
1500.0000 mg | Freq: Once | INTRAVENOUS | Status: AC
  Administered 2023-08-29 (×2): 1500 mg via INTRAVENOUS
  Filled 2023-08-29: qty 30

## 2023-08-29 MED ORDER — SODIUM CHLORIDE 0.9% FLUSH
10.0000 mL | Freq: Once | INTRAVENOUS | Status: DC
Start: 2023-08-29 — End: 2023-08-29

## 2023-08-29 NOTE — Progress Notes (Signed)
 [facility] [phone]   [phone]  PROGRESS NOTE  Patient Care Team: [name], MD as PCP - Diedre Wonda Sharper, MD as PCP - Cardiology (Cardiology) Tobie Tonita POUR, DO as Consulting Physician (Neurology) Lelon Glendia ONEIDA DEVONNA as Physician Assistant (Cardiology) Joane Artist RAMAN, MD as Consulting Physician (Family Medicine) Prentis Duwaine BROCKS, RN as Oncology Nurse Navigator Pickenpack-Cousar, Fannie SAILOR, NP as Nurse Practitioner (Hospice and Palliative Medicine)  DIAGNOSIS: Stage IIIB (T4, N2b, M0) non-small cell lung cancer, squamous cell carcinoma. She presented with large pleural-based peripheral right upper lobe lung mass measuring up to 8.2 cm in size with large right paratracheal node with low level hypermetabolic uptake.  Diagnosed in March 2025.   PDL1: 15%   PRIOR THERAPY: concurrent chemoradiation with carboplatin  for an AUC of 2 and taxol  45 mg/m2. First dose expected on 05/08/23.  Status post 4 cycles.  Last dose was given on 05/30/2023.   CURRENT THERAPY: Consolidation treatment with immunotherapy with Imfinzi  1500 Mg IV every 4 weeks.  First dose 07/03/2023.  Status post 2 cycle.    INTERVAL HISTORY:  [name] 78 y.o. female returns for a follow up prior to Cycle 3, Day 1 of Imfinzi . She is accompanied by her daughter for this visit.   On exam today, [name] reports she is tolerating her treatment well.  Her energy and appetite have improved completed her radiation with chemotherapy.  She reports her weight is unchanged.  She denies nausea, vomiting or bowel habit changes.  She denies easy bruising or signs of active bleeding.  She denies fevers, chills, night sweats, shortness of breath, chest pain or cough.  She has no other complaints.  Rest of the 10 point ROS as below.  MEDICAL HISTORY:  Past Medical History:  Diagnosis Date   AAA (abdominal aortic aneurysm) (HCC)    a. s/p stent graft repair 01/2016.   Acute ischemic  colitis (HCC) 07/29/2010   Diagnosed June, 2012 characterized by acute lower GI bleeding, spontaneously resolved.    Acute respiratory infection 06/09/2014   Acute sinus infection 05/14/2015   Allergic rhinitis, cause unspecified    Angioedema of lips 06/03/2014   Anxiety state, unspecified    Arthritis of left hip 04/16/2015   Bilateral hearing loss 07/19/2017   Chronic LBP    Coronary artery disease    a. inferior MI 1998 s/p PCI of RCA. b. stenting of Cx 04/2010. c. DES to prox LAD 05/2013. d. DES x 3 in 10/2015. // Myoview  07/2018:  EF 59, normal perfusion, low risk    Coronary artery disease involving native heart without angina pectoris 07/31/2006   Qualifier: Diagnosis of  By: Harlow MD, Sharper BRAVO  ANGIOGRAPHIC DATA:  1. Ventriculography done in the RAO projection reveals a small wall  motion abnormality in the mid inferior wall. Ejection fraction  would be estimated at around 50%.  2. The right coronary artery has some progressive disease of about 60-  80% in the proximal mid segment. The distal vessel appears to be  50-70% narrowing althou   Degeneration of lumbar or lumbosacral intervertebral disc    Depressive disorder, not elsewhere classified    Diabetes (HCC) 11/03/2006   Qualifier: Diagnosis of  By: [name] MD, Lynwood ORN    Diabetes mellitus    TYPE II   Frequent urination 05/08/2014   Gout 08/22/2013   History of endovascular stent graft for abdominal aortic aneurysm (AAA) 01/22/2016   History of radiation therapy  Right lung-04/19/23-05/29/23-Dr. [name]   Hyperlipidemia    Hypertension    Hypothyroidism    Iron  deficiency anemia 06/13/2017   Lower GI bleed 06/2010   Diverticular bleed   Lumbar radiculopathy 05/15/2015   MENOPAUSAL DISORDER 10/29/2007   Qualifier: Diagnosis of  By: [name] MD, Lynwood ORN    Myalgia 09/25/2013   Noncompliance with medications 02/26/2014   Obesity, unspecified    OTITIS MEDIA, SEROUS, CHRONIC 04/23/2007   Qualifier: Diagnosis of  By:  Harlow MD, Ozell BRAVO    Thoracic disc disease 11/19/2018   URTICARIA 09/23/2009   Qualifier: Diagnosis of  By: Inocencio MD, Berwyn A    Venous insufficiency 07/19/2017    SURGICAL HISTORY: Past Surgical History:  Procedure Laterality Date   CARDIAC CATHETERIZATION     PCI OF BOTH THE CIRCUMFLEX AND LEFT ANTERIOR DESCENDING ARTERY   CARDIAC CATHETERIZATION N/A 10/29/2015   Procedure: Coronary Stent Intervention;  Surgeon: Lonni Hanson, MD;  Location: [facility] INVASIVE CV LAB;  Service: Cardiovascular;  Laterality: N/A;   CARDIAC CATHETERIZATION N/A 10/29/2015   Procedure: Coronary/Graft Angiography;  Surgeon: Lonni Hanson, MD;  Location: [facility] INVASIVE CV LAB;  Service: Cardiovascular;  Laterality: N/A;   CARDIAC CATHETERIZATION N/A 10/29/2015   Procedure: Intravascular Pressure Wire/FFR Study;  Surgeon: Lonni Hanson, MD;  Location: [facility] INVASIVE CV LAB;  Service: Cardiovascular;  Laterality: N/A;   CESAREAN SECTION     ENDOVASCULAR STENT INSERTION N/A 01/22/2016   Procedure: ABDOMINAL AORTIC ENDOVASCULAR STENT GRAFT INSERTION;  Surgeon: Krystal JULIANNA Doing, MD;  Location: [facility] OR;  Service: Vascular;  Laterality: N/A;   HEART STENT  04-2010  and  Jun 07, 2013   X 3   IR EMBO ARTERIAL NOT HEMORR HEMANG INC GUIDE ROADMAPPING  07/17/2023   IR IMAGING GUIDED PORT INSERTION  05/17/2023   IR RADIOLOGIST EVAL & MGMT  06/21/2023   LEFT HEART CATH AND CORONARY ANGIOGRAPHY N/A 04/03/2019   Procedure: LEFT HEART CATH AND CORONARY ANGIOGRAPHY;  Surgeon: Hanson Lonni, MD;  Location: [facility] INVASIVE CV LAB;  Service: Cardiovascular;  Laterality: N/A;   LEFT HEART CATHETERIZATION WITH CORONARY ANGIOGRAM N/A 06/07/2013   Procedure: LEFT HEART CATHETERIZATION WITH CORONARY ANGIOGRAM;  Surgeon: Lonni JONETTA Cash, MD;  Location: [facility] CATH LAB;  Service: Cardiovascular;  Laterality: N/A;   LEFT HEART CATHETERIZATION WITH CORONARY ANGIOGRAM N/A 02/25/2014   Procedure: LEFT HEART CATHETERIZATION WITH CORONARY ANGIOGRAM;   Surgeon: Debby DELENA Sor, MD;  Location: [facility] CATH LAB;  Service: Cardiovascular;  Laterality: N/A;   LUMBAR FUSION  01/2007   DR. GUST...3-LEVEL WITH FIXATION   OOPHORECTOMY     BSO? pt.unsure   PARATHYROIDECTOMY     SPINE SURGERY     THYROIDECTOMY     TOTAL ABDOMINAL HYSTERECTOMY      SOCIAL HISTORY: Social History   Socioeconomic History   Marital status: Divorced    Spouse name: Not on file   Number of children: 3   Years of education: 13   Highest education level: Not on file  Occupational History   Occupation: RETIRED    Employer: [employer]    Comment: ADMIN SUPPORT    Employer: RETIRED  Tobacco Use   Smoking status: Former    Current packs/day: 0.00    Types: Cigarettes    Start date: 05/30/1956    Quit date: 05/31/1986    Years since quitting: 37.2   Smokeless tobacco: Never  Vaping Use   Vaping status: Never Used  Substance and Sexual Activity  Alcohol use: No   Drug use: No   Sexual activity: Not Currently    Comment: 1st intercourse 78 yo-Fewer than 5 partners  Other Topics Concern   Not on file  Social History Narrative   DIVORCED   3 CHILDREN   PATIENT SIGNED A DESIGNATED PARTY RELEASE TO ALLOW HER DAUGHTER, TRAMAINE Lawley, TO HAVE ACCESS TO HER MEDICAL RECORDS/INFORMATION. SHARENE PASTURES, May 04, 2009 @ 3:27 PM   Right handed   One story home      Lives alone.2025   .   Social Drivers of Health   Financial Resource Strain: Medium Risk (04/11/2023)   Overall Financial Resource Strain (CARDIA)    Difficulty of Paying Living Expenses: Somewhat hard  Food Insecurity: No Food Insecurity (04/13/2023)   Hunger Vital Sign    Worried About Running Out of Food in the Last Year: Never true    Ran Out of Food in the Last Year: Never true  Transportation Needs: No Transportation Needs (04/13/2023)   PRAPARE - Administrator, Civil Service (Medical): No    Lack of Transportation (Non-Medical): No  Physical Activity: Insufficiently Active  (04/11/2023)   Exercise Vital Sign    Days of Exercise per Week: 2 days    Minutes of Exercise per Session: 10 min  Stress: No Stress Concern Present (04/11/2023)   [employer] of Occupational Health - Occupational Stress Questionnaire    Feeling of Stress : Not at all  Social Connections: Moderately Integrated (04/11/2023)   Social Connection and Isolation Panel    Frequency of Communication with Friends and Family: More than three times a week    Frequency of Social Gatherings with Friends and Family: Twice a week    Attends Religious Services: More than 4 times per year    Active Member of [employer] or Organizations: Yes    Attends Banker Meetings: Never    Marital Status: Divorced  Recent Concern: Social Connections - Socially Isolated (03/22/2023)   Social Connection and Isolation Panel    Frequency of Communication with Friends and Family: Twice a week    Frequency of Social Gatherings with Friends and Family: Once a week    Attends Religious Services: Never    Database administrator or Organizations: No    Attends Banker Meetings: Never    Marital Status: Divorced  Catering manager Violence: Not At Risk (04/13/2023)   Humiliation, Afraid, Rape, and Kick questionnaire    Fear of Current or Ex-Partner: No    Emotionally Abused: No    Physically Abused: No    Sexually Abused: No    FAMILY HISTORY: Family History  Problem Relation Age of Onset   Heart attack Mother 65       s/p D&C-CARDIAC ARREST 1966   Heart disease Mother    Heart attack Father 36       21 WITH MI   Heart disease Father    Diabetes Brother    Diabetes Maternal Aunt    Arthritis Maternal Aunt    Breast cancer Maternal Aunt        Post menopausal   Colon cancer Neg Hx     ALLERGIES:  is allergic to crestor  [rosuvastatin  calcium ], prilosec [omeprazole ], miconazole nitrate, augmentin  [amoxicillin -pot clavulanate], and doxycycline .  MEDICATIONS:  Current Outpatient  Medications  Medication Sig Dispense Refill   alendronate  (FOSAMAX ) 70 MG tablet Take 1 tablet (70 mg total) by mouth every 7 (seven) days. Take with a full  glass of water on an empty stomach. 12 tablet 3   aspirin  81 MG EC tablet Take 81 mg by mouth daily.      atenolol  (TENORMIN ) 25 MG tablet Take 0.5 tablets (12.5 mg total) by mouth daily. 90 tablet 0   Blood Pressure Monitoring (OMRON 3 SERIES BP MONITOR) DEVI Use as directed. 1 each 0   Foot Care Products ([employer] GEL HEEL CUSHION WOMENS) PADS Use as directed at bedtime 2 each 5   gabapentin  (NEURONTIN ) 300 MG capsule Take 1-2 tab by mouth at bedtime for pain and sleep (Patient taking differently: Take 300 mg by mouth as needed. Take 1-2 tab by mouth at bedtime for pain and sleep) 180 capsule 1   lidocaine -prilocaine  (EMLA ) cream Apply 1 Application topically as needed. 30 g 2   methylPREDNISolone  (MEDROL  DOSEPAK) 4 MG TBPK tablet Taper per pharmacy 21 tablet 0   oxyCODONE  (OXY IR/ROXICODONE ) 5 MG immediate release tablet Take 1-2 tablets (5-10 mg total) by mouth every 4 (four) hours as needed for severe pain (pain score 7-10). 60 tablet 0   polyethylene glycol powder (GLYCOLAX /MIRALAX ) 17 GM/SCOOP powder Take 17 g by mouth 2 (two) times daily as needed. 3350 g 1   prochlorperazine  (COMPAZINE ) 10 MG tablet Take 1 tablet (10 mg total) by mouth every 6 (six) hours as needed. 30 tablet 2   SYNTHROID  150 MCG tablet Take 1 tablet (150 mcg total) by mouth daily before breakfast. 30 tablet 11   temazepam  (RESTORIL ) 15 MG capsule Take 1 capsule (15 mg total) by mouth at bedtime as needed for sleep. 30 capsule 2   Triamcinolone  Acetonide (ZILRETTA ) 32 MG SRER intra-articular injection 5 mL, intra-articular, bilat knees 2 each 0   No current facility-administered medications for this visit.   Facility-Administered Medications Ordered in Other Visits  Medication Dose Route Frequency Provider Last Rate Last Admin   0.9 %  sodium chloride  infusion    Intravenous Continuous Sherrod Sherrod, MD       durvalumab  (IMFINZI ) 1,500 mg in sodium chloride  0.9 % 100 mL chemo infusion  1,500 mg Intravenous Once Sherrod Sherrod, MD        REVIEW OF SYSTEMS:   Constitutional: ( - ) fevers, ( - )  chills , ( - ) night sweats Eyes: ( - ) blurriness of vision, ( - ) double vision, ( - ) watery eyes Ears, nose, mouth, throat, and face: ( - ) mucositis, ( - ) sore throat Respiratory: ( - ) cough, ( - ) dyspnea, ( - ) wheezes Cardiovascular: ( - ) palpitation, ( - ) chest discomfort, ( - ) lower extremity swelling Gastrointestinal:  ( - ) nausea, ( - ) heartburn, ( - ) change in bowel habits Skin: ( - ) abnormal skin rashes Lymphatics: ( - ) new lymphadenopathy, ( - ) easy bruising Neurological: ( - ) numbness, ( - ) tingling, ( - ) new weaknesses Behavioral/Psych: ( - ) mood change, ( - ) new changes  All other systems were reviewed with the patient and are negative.  PHYSICAL EXAMINATION: ECOG PERFORMANCE STATUS: 1 - Symptomatic but completely ambulatory  Vitals:   08/29/23 1121  BP: 125/84  Pulse: 69  Resp: 13  Temp: 98.1 F (36.7 C)  SpO2: 99%   Filed Weights   08/29/23 1121  Weight: 174 lb (78.9 kg)    GENERAL: well appearing female in NAD  SKIN: skin color, texture, turgor are normal, no rashes or significant lesions EYES: conjunctiva are  pink and non-injected, sclera clear  LUNGS: clear to auscultation and percussion with normal breathing effort HEART: regular rate & rhythm and no murmurs and no lower extremity edema Musculoskeletal: no cyanosis of digits and no clubbing  PSYCH: alert & oriented x 3, fluent speech NEURO: no focal motor/sensory deficits  LABORATORY DATA:  I have reviewed the data as listed    Latest Ref Rng & Units 08/29/2023   10:03 AM 08/01/2023    8:09 AM 07/03/2023    8:54 AM  CBC  WBC 4.0 - 10.5 K/uL 5.1  6.3  5.0   Hemoglobin 12.0 - 15.0 g/dL 87.1  87.4  88.5   Hematocrit 36.0 - 46.0 % 39.9  38.9   35.2   Platelets 150 - 400 K/uL 214  265  216        Latest Ref Rng & Units 08/29/2023   10:03 AM 08/01/2023    8:09 AM 07/03/2023    8:54 AM  CMP  Glucose 70 - 99 mg/dL 78  76  891   BUN 8 - 23 mg/dL 5  7  6    Creatinine 0.44 - 1.00 mg/dL 9.42  9.30  9.39   Sodium 135 - 145 mmol/L 142  141  138   Potassium 3.5 - 5.1 mmol/L 3.5  3.9  3.3   Chloride 98 - 111 mmol/L 104  105  101   CO2 22 - 32 mmol/L 31  30  29    Calcium  8.9 - 10.3 mg/dL 8.4  8.2  8.0   Total Protein 6.5 - 8.1 g/dL 7.3  6.9  7.0   Total Bilirubin 0.0 - 1.2 mg/dL 0.6  0.5  0.6   Alkaline Phos 38 - 126 U/L 93  88  89   AST 15 - 41 U/L 12  11  11    ALT 0 - 44 U/L 6  5  7       RADIOGRAPHIC STUDIES: I have personally reviewed the radiological images as listed and agreed with the findings in the report. No results found.  ASSESSMENT & PLAN [name] is a 78 y.o. female who presents to the clinic for continued management of Stage IIIB non-small cell lung carcer, squamous cell carcinoma.   #Stage IIIB (T4, N2b, M0) non-small cell lung cancer, squamous cell carcinoma  --Initially presented with large pleural-based peripheral right upper lobe lung mass measuring up to 8.2 cm in size with large right paratracheal node with low level hypermetabolic uptake. --Received concurrent chemoradiation with weekly carboplatin  for AUC of 2 and paclitaxel  45 Mg/M2 from 05/08/2023-05/30/2023 --Started consolidative treatment with Imfinzi  q 28 days on 07/03/2023 PLAN: --Due for Cycle 3, Day 1 of Imfinzi  today -- Labs from today were reviewed and adequate for treatment.  CBC shows no cytopenias.  CMP shows creatinine and liver enzymes are in range.  Electrolytes are normal. -- Proceed with treatment without any dose modifications. --RT in 4 weeks with labs and follow-up prior to cycle 4, day 1   No orders of the defined types were placed in this encounter.   All questions were answered. The patient knows to call the clinic with any  problems, questions or concerns.  I have spent a total of 30 minutes minutes of face-to-face and non-face-to-face time, preparing to see the patient,  performing a medically appropriate examination, counseling and educating the patient, documenting clinical information in the electronic health record, independently interpreting results and communicating results to the patient, and care coordination.   [place]  Neomi RIGGERS Department of Hematology/Oncology [facility] [place] Phone: [phone]

## 2023-08-29 NOTE — Patient Instructions (Signed)

## 2023-08-30 LAB — T4: T4, Total: 11.5 ug/dL (ref 4.5–12.0)

## 2023-08-31 ENCOUNTER — Encounter: Payer: Self-pay | Admitting: Nurse Practitioner

## 2023-08-31 ENCOUNTER — Encounter: Payer: Self-pay | Admitting: Internal Medicine

## 2023-08-31 NOTE — Progress Notes (Signed)
 Palliative Medicine [facility]  [phone] [phone]   Name: [name] Date: 08/31/2023 MRN: [id number]  DOB: February 20, 1945  Patient Care Team: [name], MD as PCP - Diedre Wonda Sharper, MD as PCP - Cardiology (Cardiology) Tobie Tonita POUR, DO as Consulting Physician (Neurology) Lelon Glendia ONEIDA DEVONNA as Physician Assistant (Cardiology) Joane Artist RAMAN, MD as Consulting Physician (Family Medicine) Prentis Duwaine BROCKS, RN as Oncology Nurse Navigator Pickenpack-Cousar, Fannie SAILOR, NP as Nurse Practitioner [facility] and Palliative Medicine)   INTERVAL HISTORY: [name] is a 78 y.o. female with  oncologic medical history including stage IIIB non-small cell lung cancer (03/2023) s/p concurrent chemoradiation with plans for immunotherapy starting 07/03/23.  Palliative is seeing patient for symptom management and goals of care.   SOCIAL HISTORY:     reports that she quit smoking about 37 years ago. Her smoking use included cigarettes. She started smoking about 67 years ago. She has never used smokeless tobacco. She reports that she does not drink alcohol and does not use drugs.  ADVANCE DIRECTIVES:  Completed on 06/26/23. Daughter [name] is designated HCPOA.   CODE STATUS: DNR  PAST MEDICAL HISTORY: Past Medical History:  Diagnosis Date   AAA (abdominal aortic aneurysm) (HCC)    a. s/p stent graft repair 01/2016.   Acute ischemic colitis (HCC) 07/29/2010   Diagnosed June, 2012 characterized by acute lower GI bleeding, spontaneously resolved.    Acute respiratory infection 06/09/2014   Acute sinus infection 05/14/2015   Allergic rhinitis, cause unspecified    Angioedema of lips 06/03/2014   Anxiety state, unspecified    Arthritis of left hip 04/16/2015   Bilateral hearing loss 07/19/2017   Chronic LBP    Coronary artery disease    a. inferior MI 1998 s/p PCI of RCA. b. stenting of Cx 04/2010. c. DES to prox LAD 05/2013. d. DES x  3 in 10/2015. // Myoview  07/2018:  EF 59, normal perfusion, low risk    Coronary artery disease involving native heart without angina pectoris 07/31/2006   Qualifier: Diagnosis of  By: Harlow MD, Sharper BRAVO  ANGIOGRAPHIC DATA:  1. Ventriculography done in the RAO projection reveals a small wall  motion abnormality in the mid inferior wall. Ejection fraction  would be estimated at around 50%.  2. The right coronary artery has some progressive disease of about 60-  80% in the proximal mid segment. The distal vessel appears to be  50-70% narrowing althou   Degeneration of lumbar or lumbosacral intervertebral disc    Depressive disorder, not elsewhere classified    Diabetes (HCC) 11/03/2006   Qualifier: Diagnosis of  By: [name] MD, Lynwood ORN    Diabetes mellitus    TYPE II   Frequent urination 05/08/2014   Gout 08/22/2013   History of endovascular stent graft for abdominal aortic aneurysm (AAA) 01/22/2016   History of radiation therapy    Right lung-04/19/23-05/29/23-Dr. [name]   Hyperlipidemia    Hypertension    Hypothyroidism    Iron  deficiency anemia 06/13/2017   Lower GI bleed 06/2010   Diverticular bleed   Lumbar radiculopathy 05/15/2015   MENOPAUSAL DISORDER 10/29/2007   Qualifier: Diagnosis of  By: [name] MD, Lynwood ORN    Myalgia 09/25/2013   Noncompliance with medications 02/26/2014   Obesity, unspecified    OTITIS MEDIA, SEROUS, CHRONIC 04/23/2007   Qualifier: Diagnosis of  By: Harlow MD, Sharper BRAVO    Thoracic disc disease 11/19/2018  URTICARIA 09/23/2009   Qualifier: Diagnosis of  By: Inocencio MD, Berwyn A    Venous insufficiency 07/19/2017    ALLERGIES:  is allergic to crestor  [rosuvastatin  calcium ], prilosec [omeprazole ], miconazole nitrate, augmentin  [amoxicillin -pot clavulanate], and doxycycline .  MEDICATIONS:  Current Outpatient Medications  Medication Sig Dispense Refill   alendronate  (FOSAMAX ) 70 MG tablet Take 1 tablet (70 mg total) by mouth every 7 (seven) days.  Take with a full glass of water on an empty stomach. 12 tablet 3   aspirin  81 MG EC tablet Take 81 mg by mouth daily.      atenolol  (TENORMIN ) 25 MG tablet Take 0.5 tablets (12.5 mg total) by mouth daily. 90 tablet 0   Blood Pressure Monitoring (OMRON 3 SERIES BP MONITOR) DEVI Use as directed. 1 each 0   Foot Care Products ([employer] GEL HEEL CUSHION WOMENS) PADS Use as directed at bedtime 2 each 5   gabapentin  (NEURONTIN ) 300 MG capsule Take 1-2 tab by mouth at bedtime for pain and sleep (Patient taking differently: Take 300 mg by mouth as needed. Take 1-2 tab by mouth at bedtime for pain and sleep) 180 capsule 1   lidocaine -prilocaine  (EMLA ) cream Apply 1 Application topically as needed. 30 g 2   methylPREDNISolone  (MEDROL  DOSEPAK) 4 MG TBPK tablet Taper per pharmacy 21 tablet 0   oxyCODONE  (OXY IR/ROXICODONE ) 5 MG immediate release tablet Take 1-2 tablets (5-10 mg total) by mouth every 4 (four) hours as needed for severe pain (pain score 7-10). 60 tablet 0   polyethylene glycol powder (GLYCOLAX /MIRALAX ) 17 GM/SCOOP powder Take 17 g by mouth 2 (two) times daily as needed. 3350 g 1   prochlorperazine  (COMPAZINE ) 10 MG tablet Take 1 tablet (10 mg total) by mouth every 6 (six) hours as needed. 30 tablet 2   SYNTHROID  150 MCG tablet Take 1 tablet (150 mcg total) by mouth daily before breakfast. 30 tablet 11   temazepam  (RESTORIL ) 15 MG capsule Take 1 capsule (15 mg total) by mouth at bedtime as needed for sleep. 30 capsule 2   Triamcinolone  Acetonide (ZILRETTA ) 32 MG SRER intra-articular injection 5 mL, intra-articular, bilat knees 2 each 0   No current facility-administered medications for this visit.    VITAL SIGNS: There were no vitals taken for this visit. There were no vitals filed for this visit.  Estimated body mass index is 28.08 kg/m as calculated from the following:   Height as of 08/09/23: 5' 6 (1.676 m).   Weight as of an earlier encounter on 08/29/23: 174 lb (78.9 kg).   PERFORMANCE  STATUS (ECOG) : 1 - Symptomatic but completely ambulatory  IMPRESSION: Discussed the use of AI scribe software for clinical note transcription with the patient, who gave verbal consent to proceed.  History of Present Illness Mrs. Meklit Fellows is a 78 year old female with non-small cell lung cancer currently undergoing immunotherapy treatments seen during her infusion for symptom management follow-up. No acute distress. Deneis concerns with nausea, vomiting, or diarrhea. Occasional fatigue. Appetite is fair. Weight is stable at 174lbs.   She experiences constipation and has been using Miralax  inconsistently, not adhering to the recommended twice-daily dosage.  Ms. Meche has concerns about her sleep, she has not taken her Restoril  as she is concerned if this will maker her overly drowsy. We discussed taking dose to see how she does with awareness of safe use. She verbalized understanding.   We discussed her pain at length. Dezhane reports pain is slowly improving. Some days are better than  others. She is taking oxycodone  5mg  as needed. States this controls her pain however on some days pain is more intense. She is not requiring daily use of oxycodone . No adjustments to regimen at this time.   No uncontrolled symptoms at this time. Patient doing well overall.   All questions answered and support provided.   Goals of Care 06/26/23: We discussed her current illness and what it means in the larger context of her on-going co-morbidities. Natural disease trajectory and expectations were discussed. Patient and her daughter are realistic in their understanding of patient's overall condition and cancer state.    I empathetically approached discussions regarding healthcare limitations, code status, and advanced directives. Patient does not have advanced directives however reports interest in completing. Extensive education provided on AD documents. Patient would like to complete at advanced directive clinic  today. She expresses wishes to name her daughter, Tramaine as her medical decision maker in the event she is unable to speak for herself, desires a natural death with no CPR or life-sustaining measures in a terminal or comatose state. Open to artificial feedings for trial if condition is reversible.    Ms. Eckley is clear in her expressed wishes to continue to treat the treatable allowing her every opportunity to continue thriving while managing her symptoms to minimize discomfort.   We discussed the patient's current illness and what it means in the larger context of their on-going co-morbidities. Natural disease trajectory and expectations were discussed.  I discussed the importance of continued conversation with family and their medical providers regarding overall plan of care and treatment options, ensuring decisions are within the context of the patients values and GOCs.  Assessment & Plan  Cancer Related Pain management Chronic right-sided pain managed effectively with oxycodone . No adjustments to regimen.  - Continue oxycodone  5 mg, 1-2 tablets as needed every 4-6hrs for pain. - Instruct to contact when down to 2-3 days' supply for refill.   Constipation Intermittent constipation likely due to opioid use. Miralax  recommended for its gentle effect. - Instruct to take Miralax  once daily, increase to twice daily if needed. - Consider alternative if Miralax  is ineffective.   Sleep disturbance Difficulty sleeping at night. Temazepam  will be tried first for sleep issues. - Instruct to try temazepam  15 mg at bedtime for sleep as previously prescribed.    Goals of Care Discussed advanced directives and goals of care. She desires a natural death without life-sustaining measures. - Completed advanced directives paperwork at AD clinic. Documents filed. - Daughter is designated as Midwife.   I will plan to follow-up with patient in 3-4 weeks. Sooner if needed.    Patient  expressed understanding and was in agreement with this plan. She also understands that She can call the clinic at any time with any questions, concerns, or complaints.   Any controlled substances utilized were prescribed in the context of palliative care. PDMP has been reviewed.   Visit consisted of counseling and education dealing with the complex and emotionally intense issues of symptom management and palliative care in the setting of serious and potentially life-threatening illness.  Levon Borer, AGPCNP-BC  Palliative Medicine Team/[facility]

## 2023-09-01 ENCOUNTER — Telehealth: Payer: Self-pay | Admitting: Family Medicine

## 2023-09-01 NOTE — Telephone Encounter (Signed)
 Patient would like to proceed with repeat bilateral Zilretta  injections. Can you run benefits for this patient?

## 2023-09-05 NOTE — Telephone Encounter (Signed)
 Left message for patient to call back to schedule.

## 2023-09-21 NOTE — Progress Notes (Signed)
 [facility] OFFICE PROGRESS NOTE  [name] ORN, MD [address] [place] [address]  DIAGNOSIS: Stage IIIB (T4, N2b, M0) non-small cell lung cancer, squamous cell carcinoma. She presented with large pleural-based peripheral right upper lobe lung mass measuring up to 8.2 cm in size with large right paratracheal node with low level hypermetabolic uptake.   PDL1: 15  PRIOR THERAPY: concurrent chemoradiation with carboplatin  for an AUC of 2 and taxol  45 mg/m2. First dose expected on 05/08/23. Status post 4 cycles. Last dose was given on 05/30/2023.   CURRENT THERAPY: Consolidation treatment with immunotherapy with Imfinzi  1500 Mg IV every 4 weeks.  First dose 07/03/2023.  Status post 3 cycles.    INTERVAL HISTORY: [name] 78 y.o. female returns to the clinic for a follow up visit accompanied by her daughter. The patient is feeling well today without any concerning complaints. The patient continues to tolerate treatment with immunotherapy with Imfinzi . She is status post 3 cycles and tolerated it well without any adverse effects. Denies any fever, chills, night sweats, or weight loss. Her appetite has come back. Denies any chest pain, shortness of breath, cough, or hemoptysis. She does not do anything that causes her to be winded. Denies any nausea, vomiting, diarrhea, or constipation. Denies any headache or visual changes. Denies any rashes or skin changes. The patient is here today for evaluation prior to starting cycle # 4    MEDICAL HISTORY: Past Medical History:  Diagnosis Date   AAA (abdominal aortic aneurysm) (HCC)    a. s/p stent graft repair 01/2016.   Acute ischemic colitis (HCC) 07/29/2010   Diagnosed June, 2012 characterized by acute lower GI bleeding, spontaneously resolved.    Acute respiratory infection 06/09/2014   Acute sinus infection 05/14/2015   Allergic rhinitis, cause unspecified    Angioedema of lips 06/03/2014   Anxiety state, unspecified     Arthritis of left hip 04/16/2015   Bilateral hearing loss 07/19/2017   Chronic LBP    Coronary artery disease    a. inferior MI 1998 s/p PCI of RCA. b. stenting of Cx 04/2010. c. DES to prox LAD 05/2013. d. DES x 3 in 10/2015. // Myoview  07/2018:  EF 59, normal perfusion, low risk    Coronary artery disease involving native heart without angina pectoris 07/31/2006   Qualifier: Diagnosis of  By: Harlow MD, Ozell BRAVO  ANGIOGRAPHIC DATA:  1. Ventriculography done in the RAO projection reveals a small wall  motion abnormality in the mid inferior wall. Ejection fraction  would be estimated at around 50%.  2. The right coronary artery has some progressive disease of about 60-  80% in the proximal mid segment. The distal vessel appears to be  50-70% narrowing althou   Degeneration of lumbar or lumbosacral intervertebral disc    Depressive disorder, not elsewhere classified    Diabetes (HCC) 11/03/2006   Qualifier: Diagnosis of  By: [name] MD, [name] ORN    Diabetes mellitus    TYPE II   Frequent urination 05/08/2014   Gout 08/22/2013   History of endovascular stent graft for abdominal aortic aneurysm (AAA) 01/22/2016   History of radiation therapy    Right lung-04/19/23-05/29/23-Dr. [name]   Hyperlipidemia    Hypertension    Hypothyroidism    Iron  deficiency anemia 06/13/2017   Lower GI bleed 06/2010   Diverticular bleed   Lumbar radiculopathy 05/15/2015   MENOPAUSAL DISORDER 10/29/2007   Qualifier: Diagnosis of  By: [name] MD, [name]  W    Myalgia 09/25/2013   Noncompliance with medications 02/26/2014   Obesity, unspecified    OTITIS MEDIA, SEROUS, CHRONIC 04/23/2007   Qualifier: Diagnosis of  By: Harlow MD, Ozell BRAVO    Thoracic disc disease 11/19/2018   URTICARIA 09/23/2009   Qualifier: Diagnosis of  By: Inocencio MD, Berwyn A    Venous insufficiency 07/19/2017    ALLERGIES:  is allergic to crestor  [rosuvastatin  calcium ], prilosec [omeprazole ], miconazole nitrate, augmentin   [amoxicillin -pot clavulanate], and doxycycline .  MEDICATIONS:  Current Outpatient Medications  Medication Sig Dispense Refill   alendronate  (FOSAMAX ) 70 MG tablet Take 1 tablet (70 mg total) by mouth every 7 (seven) days. Take with a full glass of water on an empty stomach. 12 tablet 3   aspirin  81 MG EC tablet Take 81 mg by mouth daily.      atenolol  (TENORMIN ) 25 MG tablet Take 0.5 tablets (12.5 mg total) by mouth daily. 90 tablet 0   Blood Pressure Monitoring (OMRON 3 SERIES BP MONITOR) DEVI Use as directed. 1 each 0   Foot Care Products ([employer] GEL HEEL CUSHION WOMENS) PADS Use as directed at bedtime 2 each 5   gabapentin  (NEURONTIN ) 300 MG capsule Take 1-2 tab by mouth at bedtime for pain and sleep (Patient taking differently: Take 300 mg by mouth as needed. Take 1-2 tab by mouth at bedtime for pain and sleep) 180 capsule 1   lidocaine -prilocaine  (EMLA ) cream Apply 1 Application topically as needed. 30 g 2   methylPREDNISolone  (MEDROL  DOSEPAK) 4 MG TBPK tablet Taper per pharmacy 21 tablet 0   oxyCODONE  (OXY IR/ROXICODONE ) 5 MG immediate release tablet Take 1-2 tablets (5-10 mg total) by mouth every 4 (four) hours as needed for severe pain (pain score 7-10). 60 tablet 0   polyethylene glycol powder (GLYCOLAX /MIRALAX ) 17 GM/SCOOP powder Take 17 g by mouth 2 (two) times daily as needed. 3350 g 1   prochlorperazine  (COMPAZINE ) 10 MG tablet Take 1 tablet (10 mg total) by mouth every 6 (six) hours as needed. 30 tablet 2   SYNTHROID  150 MCG tablet Take 1 tablet (150 mcg total) by mouth daily before breakfast. 30 tablet 11   temazepam  (RESTORIL ) 15 MG capsule Take 1 capsule (15 mg total) by mouth at bedtime as needed for sleep. 30 capsule 2   Triamcinolone  Acetonide (ZILRETTA ) 32 MG SRER intra-articular injection 5 mL, intra-articular, bilat knees 2 each 0   No current facility-administered medications for this visit.    SURGICAL HISTORY:  Past Surgical History:  Procedure Laterality Date    CARDIAC CATHETERIZATION     PCI OF BOTH THE CIRCUMFLEX AND LEFT ANTERIOR DESCENDING ARTERY   CARDIAC CATHETERIZATION N/A 10/29/2015   Procedure: Coronary Stent Intervention;  Surgeon: Lonni Hanson, MD;  Location: [facility] INVASIVE CV LAB;  Service: Cardiovascular;  Laterality: N/A;   CARDIAC CATHETERIZATION N/A 10/29/2015   Procedure: Coronary/Graft Angiography;  Surgeon: Lonni Hanson, MD;  Location: [facility] INVASIVE CV LAB;  Service: Cardiovascular;  Laterality: N/A;   CARDIAC CATHETERIZATION N/A 10/29/2015   Procedure: Intravascular Pressure Wire/FFR Study;  Surgeon: Lonni Hanson, MD;  [facility];  Service: Cardiovascular;  Laterality: N/A;   CESAREAN SECTION     ENDOVASCULAR STENT INSERTION N/A 01/22/2016   Procedure: ABDOMINAL AORTIC ENDOVASCULAR STENT GRAFT INSERTION;  Surgeon: Krystal JULIANNA Doing, MD;  Location: [facility] OR;  Service: Vascular;  Laterality: N/A;   HEART STENT  04-2010  and  Jun 07, 2013   X 3   IR EMBO ARTERIAL NOT  HEMORR HEMANG INC GUIDE ROADMAPPING  07/17/2023   IR IMAGING GUIDED PORT INSERTION  05/17/2023   IR RADIOLOGIST EVAL & MGMT  06/21/2023   LEFT HEART CATH AND CORONARY ANGIOGRAPHY N/A 04/03/2019   Procedure: LEFT HEART CATH AND CORONARY ANGIOGRAPHY;  Surgeon: Mady Bruckner, MD;  Location: [facility] INVASIVE CV LAB;  Service: Cardiovascular;  Laterality: N/A;   LEFT HEART CATHETERIZATION WITH CORONARY ANGIOGRAM N/A 06/07/2013   Procedure: LEFT HEART CATHETERIZATION WITH CORONARY ANGIOGRAM;  Surgeon: Bruckner JONETTA Cash, MD;  Location[facility] C[facility] LAB;  Service: Cardiovascular;  Laterality: N/A;   LEFT HEART CATHETERIZATION WITH CORONARY ANGIOGRAM N/A 02/25/2014   Procedure: LEFT HEART CATHETERIZATION WITH CORONARY ANGIOGRAM;  Surgeon: Debby DELENA Sor, MD;  Location: [facility] CATH LAB;  Service: Cardiovascular;  Laterality: N/A;   LUMBAR FUSION  01/2007   DR. GUST...3-LEVEL WITH FIXATION   OOPHORECTOMY     BSO? pt.unsure   PARATHYROIDECTOMY     SPINE SURGERY     THYROIDECTOMY      TOTAL ABDOMINAL HYSTERECTOMY      REVIEW OF SYSTEMS:   Review of Systems  Constitutional: Negative for appetite change, chills, fatigue, fever and unexpected weight change.  HENT:   Negative for mouth sores, nosebleeds, sore throat and trouble swallowing.   Eyes: Negative for eye problems and icterus.  Respiratory: Negative for cough, hemoptysis, shortness of breath and wheezing.   Cardiovascular: Negative for chest pain and leg swelling.  Gastrointestinal: Negative for abdominal pain, constipation, diarrhea, nausea and vomiting.  Genitourinary: Negative for bladder incontinence, difficulty urinating, dysuria, frequency and hematuria.   Musculoskeletal: Negative for back pain, gait problem, neck pain and neck stiffness.  Skin: Negative for itching and rash.  Neurological: Negative for dizziness, extremity weakness, gait problem, headaches, light-headedness and seizures.  Hematological: Negative for adenopathy. Does not bruise/bleed easily.  Psychiatric/Behavioral: Negative for confusion, depression and sleep disturbance. The patient is not nervous/anxious.     PHYSICAL EXAMINATION:  There were no vitals taken for this visit.  ECOG PERFORMANCE STATUS: 1  Physical Exam  Constitutional: Oriented to person, place, and time and well-developed, well-nourished, and in no distress.  HENT:  Head: Normocephalic and atraumatic.  Mouth/Throat: Oropharynx is clear and moist. No oropharyngeal exudate.  Eyes: Conjunctivae are normal. Right eye exhibits no discharge. Left eye exhibits no discharge. No scleral icterus.  Neck: Normal range of motion. Neck supple.  Cardiovascular: Normal rate, regular rhythm, normal heart sounds and intact distal pulses.   Pulmonary/Chest: Effort normal and breath sounds normal. No respiratory distress. No wheezes. No rales.  Abdominal: Soft. Bowel sounds are normal. Exhibits no distension and no mass. There is no tenderness.  Musculoskeletal: Normal range of  motion. Exhibits no edema.  Lymphadenopathy:    No cervical adenopathy.  Neurological: Alert and oriented to person, place, and time. Exhibits normal muscle tone. Examined in the wheelchair.   Skin: Skin is warm and dry. No rash noted. Not diaphoretic. No erythema. No pallor.  Psychiatric: Mood, memory and judgment normal.  Vitals reviewed.  LABORATORY DATA: Lab Results  Component Value Date   WBC 5.1 08/29/2023   HGB 12.8 08/29/2023   HCT 39.9 08/29/2023   MCV 94.1 08/29/2023   PLT 214 08/29/2023      Chemistry      Component Value Date/Time   NA 142 08/29/2023 1003   NA 141 06/08/2021 1606   K 3.5 08/29/2023 1003   CL 104 08/29/2023 1003   CO2 31 08/29/2023 1003   BUN <5 (L) 08/29/2023  1003   BUN 4 (L) 06/08/2021 1606   CREATININE 0.57 08/29/2023 1003      Component Value Date/Time   CALCIUM  8.4 (L) 08/29/2023 1003   ALKPHOS 93 08/29/2023 1003   AST 12 (L) 08/29/2023 1003   ALT 6 08/29/2023 1003   BILITOT 0.6 08/29/2023 1003       RADIOGRAPHIC STUDIES:  No results found.   ASSESSMENT/PLAN:  This is a very pleasant 78 year old African-American female with Stage IIIB (T4, N2b, M0) non-small cell lung cancer, squamous cell carcinoma. She presented with large pleural-based peripheral right upper lobe lung mass measuring up to 8.2 cm in size with large right paratracheal node with low level hypermetabolic uptake.  She underwent a course of concurrent chemoradiation with weekly carboplatin  for AUC of 2 and paclitaxel  45 Mg/M2.  Status post 4 cycles.  The patient is currently undergoing consolidation immunotherapy with Imfinzi  1500 mg IV every 4 weeks. She is status post 3 cycles.   Labs were reviewed. Her CMP is pending. As long as that is within parameters. Recommend she proceed with cycle #4 today as scheduled.   I will arrange for a restaging CT of the chest prior to her next cycle of treatment.   We will see her in 4 weeks before undergoing cycle #5.   The  patient was advised to call immediately if she has any concerning symptoms in the interval. The patient voices understanding of current disease status and treatment options and is in agreement with the current care plan. All questions were answered. The patient knows to call the clinic with any problems, questions or concerns. We can certainly see the patient much sooner if necessary   No orders of the defined types were placed in this encounter.   The total time spent in the appointment was 20-29 minutes  Lucylle Foulkes L Veera Stapleton, PA-C 09/21/23

## 2023-09-22 ENCOUNTER — Other Ambulatory Visit: Payer: Self-pay

## 2023-09-22 ENCOUNTER — Ambulatory Visit: Admitting: Family Medicine

## 2023-09-22 VITALS — BP 126/84 | HR 68 | Ht 66.0 in | Wt 174.0 lb

## 2023-09-22 DIAGNOSIS — M170 Bilateral primary osteoarthritis of knee: Secondary | ICD-10-CM | POA: Diagnosis not present

## 2023-09-22 DIAGNOSIS — M25561 Pain in right knee: Secondary | ICD-10-CM

## 2023-09-22 DIAGNOSIS — M25562 Pain in left knee: Secondary | ICD-10-CM | POA: Diagnosis not present

## 2023-09-22 DIAGNOSIS — G8929 Other chronic pain: Secondary | ICD-10-CM

## 2023-09-22 MED ORDER — TRIAMCINOLONE ACETONIDE 32 MG IX SRER
32.0000 mg | Freq: Once | INTRA_ARTICULAR | Status: AC
Start: 2023-09-22 — End: 2023-09-22
  Administered 2023-09-22: 32 mg via INTRA_ARTICULAR

## 2023-09-22 NOTE — Patient Instructions (Signed)
Thank you for coming in today. Call or go to the ER if you develop a large red swollen joint with extreme pain or oozing puss.  Let me know how you are feeling.

## 2023-09-22 NOTE — Progress Notes (Signed)
 [name] is a 78 y.o. female who presents to [facility] at [facility] today for exacerbation of her bilat knee pain. Pt was last seen by Dr. Joane on 06/13/23 and was given repeat bilateral Zilretta  injections.   Today, pt reports prior injections last a pretty long time. She note she waiting too long to come back for a visit. She states that she is barely able to get around do to severe knee pain  Dx imaging: 11/21/19 R & L knee XR  Pertinent review of systems: No fever or chills  Relevant historical information: Hx lung cancer. Severe knee DJD   Exam:  BP 126/84   Pulse 68   Ht 5' 6 (1.676 m)   Wt 174 lb (78.9 kg)   SpO2 98%   BMI 28.08 kg/m  General: Well Developed, well nourished, and in no acute distress.   MSK: Bilateral knee moderate effusion. Decreased range of motion. Tender palpation. Antalgic gait.    Lab and Radiology Results   Zilretta  injection BL knee Procedure: Real-time Ultrasound Guided Injection of right knee joint superior lateral patellar space Device: Philips Affiniti 50G Images permanently stored and available for review in PACS Verbal informed consent obtained.  Discussed risks and benefits of procedure. Warned about infection, hyperglycemia bleeding, damage to structures among others. Patient expresses understanding and agreement Time-out conducted.   Noted no overlying erythema, induration, or other signs of local infection.   Skin prepped in a sterile fashion.   Local anesthesia: Topical Ethyl chloride.   With sterile technique and under real time ultrasound guidance: Zilretta  32 mg injected into knee joint. Fluid seen entering the joint capsule.   Completed without difficulty   Advised to call if fevers/chills, erythema, induration, drainage, or persistent bleeding.   Images permanently stored and available for review in the ultrasound unit.  Impression: Technically successful ultrasound guided  injection.   Procedure: Real-time Ultrasound Guided Injection of left knee joint superior lateral patellar space Device: Philips Affiniti 50G Images permanently stored and available for review in PACS Verbal informed consent obtained.  Discussed risks and benefits of procedure. Warned about infection, hyperglycemia bleeding, damage to structures among others. Patient expresses understanding and agreement Time-out conducted.   Noted no overlying erythema, induration, or other signs of local infection.   Skin prepped in a sterile fashion.   Local anesthesia: Topical Ethyl chloride.   With sterile technique and under real time ultrasound guidance: Zilretta  32 mg injected into knee joint. Fluid seen entering the joint capsule.   Completed without difficulty   Advised to call if fevers/chills, erythema, induration, drainage, or persistent bleeding.   Images permanently stored and available for review in the ultrasound unit.  Impression: Technically successful ultrasound guided injection.  Lot number: [id number]     Assessment and Plan: 78 y.o. female with BL knee pain due to DJD. Plan for repeat zilretta  injection.  Recheck as needed. We can repeat this injection at the 3 month mark if needed.    PDMP not reviewed this encounter. Orders Placed This Encounter  Procedures   US  LIMITED JOINT SPACE STRUCTURES LOW BILAT(NO LINKED CHARGES)    Reason for Exam (SYMPTOM  OR DIAGNOSIS REQUIRED):   bilateral knee pain    Preferred imaging location?:   [facility] ordered this encounter  Medications   Triamcinolone  Acetonide (ZILRETTA ) intra-articular injection 32 mg   Triamcinolone  Acetonide (ZILRETTA ) intra-articular injection 32 mg     Discussed warning  signs or symptoms. Please see discharge instructions. Patient expresses understanding.   The above documentation has been reviewed and is accurate and complete Artist Lloyd, M.D.

## 2023-09-26 ENCOUNTER — Inpatient Hospital Stay

## 2023-09-26 ENCOUNTER — Inpatient Hospital Stay: Attending: Internal Medicine

## 2023-09-26 ENCOUNTER — Encounter: Payer: Self-pay | Admitting: Nurse Practitioner

## 2023-09-26 ENCOUNTER — Inpatient Hospital Stay (HOSPITAL_BASED_OUTPATIENT_CLINIC_OR_DEPARTMENT_OTHER): Admitting: Nurse Practitioner

## 2023-09-26 ENCOUNTER — Inpatient Hospital Stay (HOSPITAL_BASED_OUTPATIENT_CLINIC_OR_DEPARTMENT_OTHER): Admitting: Physician Assistant

## 2023-09-26 VITALS — BP 119/63 | HR 72 | Resp 16

## 2023-09-26 VITALS — BP 138/84 | HR 73 | Temp 98.0°F | Resp 15 | Wt 174.3 lb

## 2023-09-26 DIAGNOSIS — Z95828 Presence of other vascular implants and grafts: Secondary | ICD-10-CM

## 2023-09-26 DIAGNOSIS — Z5112 Encounter for antineoplastic immunotherapy: Secondary | ICD-10-CM | POA: Insufficient documentation

## 2023-09-26 DIAGNOSIS — C3411 Malignant neoplasm of upper lobe, right bronchus or lung: Secondary | ICD-10-CM

## 2023-09-26 DIAGNOSIS — G4709 Other insomnia: Secondary | ICD-10-CM

## 2023-09-26 DIAGNOSIS — G893 Neoplasm related pain (acute) (chronic): Secondary | ICD-10-CM

## 2023-09-26 DIAGNOSIS — C3490 Malignant neoplasm of unspecified part of unspecified bronchus or lung: Secondary | ICD-10-CM | POA: Diagnosis not present

## 2023-09-26 DIAGNOSIS — R530 Neoplastic (malignant) related fatigue: Secondary | ICD-10-CM

## 2023-09-26 DIAGNOSIS — M792 Neuralgia and neuritis, unspecified: Secondary | ICD-10-CM

## 2023-09-26 DIAGNOSIS — Z515 Encounter for palliative care: Secondary | ICD-10-CM

## 2023-09-26 LAB — CBC WITH DIFFERENTIAL (CANCER CENTER ONLY)
Abs Immature Granulocytes: 0.03 K/uL (ref 0.00–0.07)
Basophils Absolute: 0 K/uL (ref 0.0–0.1)
Basophils Relative: 0 %
Eosinophils Absolute: 0 K/uL (ref 0.0–0.5)
Eosinophils Relative: 0 %
HCT: 42 % (ref 36.0–46.0)
Hemoglobin: 13.7 g/dL (ref 12.0–15.0)
Immature Granulocytes: 0 %
Lymphocytes Relative: 12 %
Lymphs Abs: 0.9 K/uL (ref 0.7–4.0)
MCH: 29.9 pg (ref 26.0–34.0)
MCHC: 32.6 g/dL (ref 30.0–36.0)
MCV: 91.7 fL (ref 80.0–100.0)
Monocytes Absolute: 0.7 K/uL (ref 0.1–1.0)
Monocytes Relative: 10 %
Neutro Abs: 5.4 K/uL (ref 1.7–7.7)
Neutrophils Relative %: 78 %
Platelet Count: 229 K/uL (ref 150–400)
RBC: 4.58 MIL/uL (ref 3.87–5.11)
RDW: 13.1 % (ref 11.5–15.5)
WBC Count: 7 K/uL (ref 4.0–10.5)
nRBC: 0 % (ref 0.0–0.2)

## 2023-09-26 LAB — CMP (CANCER CENTER ONLY)
ALT: 6 U/L (ref 0–44)
AST: 10 U/L — ABNORMAL LOW (ref 15–41)
Albumin: 3.9 g/dL (ref 3.5–5.0)
Alkaline Phosphatase: 86 U/L (ref 38–126)
Anion gap: 6 (ref 5–15)
BUN: 11 mg/dL (ref 8–23)
CO2: 29 mmol/L (ref 22–32)
Calcium: 8.5 mg/dL — ABNORMAL LOW (ref 8.9–10.3)
Chloride: 105 mmol/L (ref 98–111)
Creatinine: 0.64 mg/dL (ref 0.44–1.00)
GFR, Estimated: >60 mL/min (ref >60)
Glucose, Bld: 100 mg/dL — ABNORMAL HIGH (ref 70–99)
Potassium: 4.2 mmol/L (ref 3.5–5.1)
Sodium: 140 mmol/L (ref 135–145)
Total Bilirubin: 0.5 mg/dL (ref 0.0–1.2)
Total Protein: 7.5 g/dL (ref 6.5–8.1)

## 2023-09-26 MED ORDER — SODIUM CHLORIDE 0.9 % IV SOLN: INTRAVENOUS | Status: DC

## 2023-09-26 MED ORDER — SODIUM CHLORIDE 0.9% FLUSH
10.0000 mL | INTRAVENOUS | Status: DC | PRN
  Administered 2023-09-26: 10 mL

## 2023-09-26 MED ORDER — SODIUM CHLORIDE 0.9 % IV SOLN
1500.0000 mg | Freq: Once | INTRAVENOUS | Status: AC
  Administered 2023-09-26: 1500 mg via INTRAVENOUS
  Filled 2023-09-26: qty 30

## 2023-09-26 MED ORDER — SODIUM CHLORIDE 0.9% FLUSH
10.0000 mL | Freq: Once | INTRAVENOUS | Status: AC
  Administered 2023-09-26: 10 mL

## 2023-09-26 NOTE — Progress Notes (Signed)
 Palliative Medicine [facility]  Telephone:([phone] Fax:([phone]) [phone]   Name: [name] Date: 09/26/2023 MRN: [id number]  DOB: 22-Mar-1945  Patient Care Team: [name], MD as PCP - Diedre Wonda Sharper, MD as PCP - Cardiology (Cardiology) Tobie Tonita POUR, DO as Consulting Physician (Neurology) Lelon Glendia ONEIDA DEVONNA as Physician Assistant (Cardiology) Joane Artist RAMAN, MD as Consulting Physician (Family Medicine) Prentis Duwaine BROCKS, RN as Oncology Nurse Navigator Pickenpack-Cousar, Fannie SAILOR, NP as Nurse Practitioner [facility])   INTERVAL HISTORY: [name] is a 78 y.o. female with  oncologic medical history including stage IIIB non-small cell lung cancer (03/2023) s/p concurrent chemoradiation with plans for immunotherapy starting 07/03/23.  Palliative is seeing patient for symptom management and goals of care.   SOCIAL HISTORY:     reports that she quit smoking about 37 years ago. Her smoking use included cigarettes. She started smoking about 67 years ago. She has never used smokeless tobacco. She reports that she does not drink alcohol and does not use drugs.  ADVANCE DIRECTIVES:  Completed on 06/26/23. Daughter Shanena Pellegrino is designated HCPOA.   CODE STATUS: DNR  PAST MEDICAL HISTORY: Past Medical History:  Diagnosis Date   AAA (abdominal aortic aneurysm) (HCC)    a. s/p stent graft repair 01/2016.   Acute ischemic colitis (HCC) 07/29/2010   Diagnosed June, 2012 characterized by acute lower GI bleeding, spontaneously resolved.    Acute respiratory infection 06/09/2014   Acute sinus infection 05/14/2015   Allergic rhinitis, cause unspecified    Angioedema of lips 06/03/2014   Anxiety state, unspecified    Arthritis of left hip 04/16/2015   Bilateral hearing loss 07/19/2017   Chronic LBP    Coronary artery disease    a. inferior MI 1998 s/p PCI of RCA. b. stenting of Cx 04/2010. c. DES to prox LAD 05/2013. d. DES x 3  in 10/2015. // Myoview  07/2018:  EF 59, normal perfusion, low risk    Coronary artery disease involving native heart without angina pectoris 07/31/2006   Qualifier: Diagnosis of  By: Harlow MD, Sharper BRAVO  ANGIOGRAPHIC DATA:  1. Ventriculography done in the RAO projection reveals a small wall  motion abnormality in the mid inferior wall. Ejection fraction  would be estimated at around 50%.  2. The right coronary artery has some progressive disease of about 60-  80% in the proximal mid segment. The distal vessel appears to be  50-70% narrowing althou   Degeneration of lumbar or lumbosacral intervertebral disc    Depressive disorder, not elsewhere classified    Diabetes (HCC) 11/03/2006   Qualifier: Diagnosis of  By: [name] MD, Lynwood ORN    Diabetes mellitus    TYPE II   Frequent urination 05/08/2014   Gout 08/22/2013   History of endovascular stent graft for abdominal aortic aneurysm (AAA) 01/22/2016   History of radiation therapy    Right lung-04/19/23-05/29/23-Dr. [name]   Hyperlipidemia    Hypertension    Hypothyroidism    Iron  deficiency anemia 06/13/2017   Lower GI bleed 06/2010   Diverticular bleed   Lumbar radiculopathy 05/15/2015   MENOPAUSAL DISORDER 10/29/2007   Qualifier: Diagnosis of  By: [name] MD, Lynwood ORN    Myalgia 09/25/2013   Noncompliance with medications 02/26/2014   Obesity, unspecified    OTITIS MEDIA, SEROUS, CHRONIC 04/23/2007   Qualifier: Diagnosis of  By: Harlow MD, Sharper BRAVO    Thoracic disc disease 11/19/2018  URTICARIA 09/23/2009   Qualifier: Diagnosis of  By: Inocencio MD, Berwyn A    Venous insufficiency 07/19/2017    ALLERGIES:  is allergic to crestor  [rosuvastatin  calcium ], prilosec [omeprazole ], miconazole nitrate, augmentin  [amoxicillin -pot clavulanate], and doxycycline .  MEDICATIONS:  Current Outpatient Medications  Medication Sig Dispense Refill   alendronate  (FOSAMAX ) 70 MG tablet Take 1 tablet (70 mg total) by mouth every 7 (seven) days. Take  with a full glass of water on an empty stomach. 12 tablet 3   aspirin  81 MG EC tablet Take 81 mg by mouth daily.      atenolol  (TENORMIN ) 25 MG tablet Take 0.5 tablets (12.5 mg total) by mouth daily. 90 tablet 0   Blood Pressure Monitoring (OMRON 3 SERIES BP MONITOR) DEVI Use as directed. 1 each 0   Foot Care Products ([employer] GEL HEEL CUSHION WOMENS) PADS Use as directed at bedtime 2 each 5   gabapentin  (NEURONTIN ) 300 MG capsule Take 1-2 tab by mouth at bedtime for pain and sleep (Patient taking differently: Take 300 mg by mouth as needed. Take 1-2 tab by mouth at bedtime for pain and sleep) 180 capsule 1   lidocaine -prilocaine  (EMLA ) cream Apply 1 Application topically as needed. 30 g 2   methylPREDNISolone  (MEDROL  DOSEPAK) 4 MG TBPK tablet Taper per pharmacy (Patient not taking: Reported on 09/26/2023) 21 tablet 0   oxyCODONE  (OXY IR/ROXICODONE ) 5 MG immediate release tablet Take 1-2 tablets (5-10 mg total) by mouth every 4 (four) hours as needed for severe pain (pain score 7-10). 60 tablet 0   polyethylene glycol powder (GLYCOLAX /MIRALAX ) 17 GM/SCOOP powder Take 17 g by mouth 2 (two) times daily as needed. 3350 g 1   prochlorperazine  (COMPAZINE ) 10 MG tablet Take 1 tablet (10 mg total) by mouth every 6 (six) hours as needed. 30 tablet 2   SYNTHROID  150 MCG tablet Take 1 tablet (150 mcg total) by mouth daily before breakfast. 30 tablet 11   temazepam  (RESTORIL ) 15 MG capsule Take 1 capsule (15 mg total) by mouth at bedtime as needed for sleep. 30 capsule 2   Triamcinolone  Acetonide (ZILRETTA ) 32 MG SRER intra-articular injection 5 mL, intra-articular, bilat knees 2 each 0   No current facility-administered medications for this visit.   Facility-Administered Medications Ordered in Other Visits  Medication Dose Route Frequency Provider Last Rate Last Admin   0.9 %  sodium chloride  infusion   Intravenous Continuous Sherrod Sherrod, MD   Stopped at 09/26/23 1335   sodium chloride  flush (NS) 0.9 %  injection 10 mL  10 mL Intracatheter PRN Sherrod Sherrod, MD   10 mL at 09/26/23 1337    VITAL SIGNS: There were no vitals taken for this visit. There were no vitals filed for this visit.  Estimated body mass index is 28.13 kg/m as calculated from the following:   Height as of 09/22/23: 5' 6 (1.676 m).   Weight as of an earlier encounter on 09/26/23: 174 lb 4.8 oz (79.1 kg).   PERFORMANCE STATUS (ECOG) : 1 - Symptomatic but completely ambulatory  IMPRESSION: Discussed the use of AI scribe software for clinical note transcription with the patient, who gave verbal consent to proceed.  History of Present Illness Mrs. [name] is a 78 year old female with non-small cell lung cancer currently undergoing immunotherapy treatments seen during her infusion for symptom management follow-up. No acute distress. Denies concerns with nausea, vomiting, or diarrhea. Occasional fatigue. She notes an improvement in her appetite, stating 'my appetite came back'. Weight is  stable at 174lbs.   She experiences constipation and has been using Miralax  inconsistently, not adhering to the recommended twice-daily dosage.  Ms. Larrick has concerns about her sleep, she has not taken her Restoril  as she is concerned if this will maker her overly drowsy. We discussed taking half dose to see how she does with awareness of safe use. She verbalized understanding.   She experiences knee pain, for which she received injections for recently. The injections are starting to provide relief.  We discussed her pain at length. Jannet reports pain is slowly improving. Some days are better than others. She is taking oxycodone  5mg  as needed. States this controls her pain however on some days pain is more intense. She is not requiring daily use of oxycodone . No adjustments to regimen at this time.   No uncontrolled symptoms at this time. Patient doing well overall.   All questions answered and support provided.   Goals of  Care 06/26/23: We discussed her current illness and what it means in the larger context of her on-going co-morbidities. Natural disease trajectory and expectations were discussed. Patient and her daughter are realistic in their understanding of patient's overall condition and cancer state.    I empathetically approached discussions regarding healthcare limitations, code status, and advanced directives. Patient does not have advanced directives however reports interest in completing. Extensive education provided on AD documents. Patient would like to complete at advanced directive clinic today. She expresses wishes to name her daughter, Tramaine as her medical decision maker in the event she is unable to speak for herself, desires a natural death with no CPR or life-sustaining measures in a terminal or comatose state. Open to artificial feedings for trial if condition is reversible.    Ms. Brophy is clear in her expressed wishes to continue to treat the treatable allowing her every opportunity to continue thriving while managing her symptoms to minimize discomfort.   We discussed the patient's current illness and what it means in the larger context of their on-going co-morbidities. Natural disease trajectory and expectations were discussed.  I discussed the importance of continued conversation with family and their medical providers regarding overall plan of care and treatment options, ensuring decisions are within the context of the patients values and GOCs.  Assessment & Plan  Cancer Related Pain management Chronic right-sided pain managed effectively with oxycodone . No adjustments to regimen.  - Continue oxycodone  5 mg, 1-2 tablets as needed every 4-6hrs for pain. - Instruct to contact when down to 2-3 days' supply for refill.   Constipation Intermittent constipation likely due to opioid use. Miralax  recommended for its gentle effect. - Instruct to take Miralax  once daily, increase to twice daily  if needed. - Consider alternative if Miralax  is ineffective.   Sleep disturbance Difficulty sleeping at night. Temazepam  will be tried first for sleep issues. - Instruct to try temazepam  15 mg at bedtime for sleep as previously prescribed.    Goals of Care Discussed advanced directives and goals of care. She desires a natural death without life-sustaining measures. - Completed advanced directives paperwork at AD clinic. Documents filed. - Daughter is designated as Midwife.   I will plan to follow-up with patient in 3-4 weeks. Sooner if needed.    Patient expressed understanding and was in agreement with this plan. She also understands that She can call the clinic at any time with any questions, concerns, or complaints.   Any controlled substances utilized were prescribed in the context of palliative  care. PDMP has been reviewed.   Visit consisted of counseling and education dealing with the complex and emotionally intense issues of symptom management and palliative care in the setting of serious and potentially life-threatening illness.  Levon Borer, AGPCNP-BC  Palliative Medicine Team/ Cancer Center

## 2023-09-26 NOTE — Patient Instructions (Signed)

## 2023-09-27 ENCOUNTER — Telehealth: Payer: Self-pay | Admitting: Internal Medicine

## 2023-09-27 NOTE — Telephone Encounter (Signed)
 Scheduled appointments per WQ. Talked with the patient and she is aware of the made appointments.

## 2023-10-03 ENCOUNTER — Encounter: Payer: Self-pay | Admitting: Internal Medicine

## 2023-10-03 ENCOUNTER — Ambulatory Visit: Admitting: Internal Medicine

## 2023-10-03 VITALS — BP 120/74 | Temp 98.7°F | Ht 66.0 in

## 2023-10-03 DIAGNOSIS — M1712 Unilateral primary osteoarthritis, left knee: Secondary | ICD-10-CM

## 2023-10-03 DIAGNOSIS — J019 Acute sinusitis, unspecified: Secondary | ICD-10-CM

## 2023-10-03 DIAGNOSIS — I10 Essential (primary) hypertension: Secondary | ICD-10-CM

## 2023-10-03 DIAGNOSIS — E1165 Type 2 diabetes mellitus with hyperglycemia: Secondary | ICD-10-CM | POA: Diagnosis not present

## 2023-10-03 DIAGNOSIS — H9201 Otalgia, right ear: Secondary | ICD-10-CM

## 2023-10-03 LAB — POC INFLUENZA A&B (BINAX/QUICKVUE)
Influenza A, POC: NEGATIVE
Influenza B, POC: NEGATIVE

## 2023-10-03 LAB — POC COVID19 BINAXNOW: SARS Coronavirus 2 Ag: NEGATIVE

## 2023-10-03 MED ORDER — HYDROCODONE BIT-HOMATROP MBR 5-1.5 MG/5ML PO SOLN: 5.0000 mL | Freq: Four times a day (QID) | ORAL | 0 refills | Status: AC | PRN

## 2023-10-03 MED ORDER — AZITHROMYCIN 250 MG PO TABS: ORAL_TABLET | ORAL | 1 refills | Status: AC

## 2023-10-03 MED ORDER — KETOROLAC TROMETHAMINE 30 MG/ML IJ SOLN
30.0000 mg | Freq: Once | INTRAMUSCULAR | Status: AC
  Administered 2023-10-03: 30 mg via INTRAMUSCULAR

## 2023-10-03 NOTE — Assessment & Plan Note (Signed)
 BP Readings from Last 3 Encounters:  10/03/23 120/74  09/26/23 119/63  09/26/23 138/84   Stable, pt to continue medical treatment tenormin  12.5 mg qd

## 2023-10-03 NOTE — Progress Notes (Signed)
 Patient ID: [name], female   DOB: August 21, 1945, 78 y.o.   MRN: [id number]        Chief Complaint: follow up acute sinus infection, left knee arthritis, dm, htn       HPI:  [name] is a 78 y.o. female  Here with 2-3 days acute onset fever, facial pain, pressure, headache, general weakness and malaise, and greenish d/c, with mild ST and cough, but pt denies chest pain, wheezing, increased sob or doe, orthopnea, PND, increased LE swelling, palpitations, dizziness or syncope.   Pt denies polydipsia, polyuria, or new focal neuro s/s.    Pt denies wt loss, night sweats, loss of appetite, or other constitutional symptoms.  Does also have moderate worse left knee pain and swelling, but no giveaways or falls.         Wt Readings from Last 3 Encounters:  09/26/23 174 lb 4.8 oz (79.1 kg)  09/22/23 174 lb (78.9 kg)  08/29/23 174 lb (78.9 kg)   BP Readings from Last 3 Encounters:  10/03/23 120/74  09/26/23 119/63  09/26/23 138/84         Past Medical History:  Diagnosis Date   AAA (abdominal aortic aneurysm) (HCC)    a. s/p stent graft repair 01/2016.   Acute ischemic colitis (HCC) 07/29/2010   Diagnosed June, 2012 characterized by acute lower GI bleeding, spontaneously resolved.    Acute respiratory infection 06/09/2014   Acute sinus infection 05/14/2015   Allergic rhinitis, cause unspecified    Angioedema of lips 06/03/2014   Anxiety state, unspecified    Arthritis of left hip 04/16/2015   Bilateral hearing loss 07/19/2017   Chronic LBP    Coronary artery disease    a. inferior MI 1998 s/p PCI of RCA. b. stenting of Cx 04/2010. c. DES to prox LAD 05/2013. d. DES x 3 in 10/2015. // Myoview  07/2018:  EF 59, normal perfusion, low risk    Coronary artery disease involving native heart without angina pectoris 07/31/2006   Qualifier: Diagnosis of  By: Harlow MD, Ozell BRAVO  ANGIOGRAPHIC DATA:  1. Ventriculography done in the RAO projection reveals a small wall  motion abnormality in the mid  inferior wall. Ejection fraction  would be estimated at around 50%.  2. The right coronary artery has some progressive disease of about 60-  80% in the proximal mid segment. The distal vessel appears to be  50-70% narrowing althou   Degeneration of lumbar or lumbosacral intervertebral disc    Depressive disorder, not elsewhere classified    Diabetes (HCC) 11/03/2006   Qualifier: Diagnosis of  By: [name] MD, Lynwood ORN    Diabetes mellitus    TYPE II   Frequent urination 05/08/2014   Gout 08/22/2013   History of endovascular stent graft for abdominal aortic aneurysm (AAA) 01/22/2016   History of radiation therapy    Right lung-04/19/23-05/29/23-Dr. [name]   Hyperlipidemia    Hypertension    Hypothyroidism    Iron  deficiency anemia 06/13/2017   Lower GI bleed 06/2010   Diverticular bleed   Lumbar radiculopathy 05/15/2015   MENOPAUSAL DISORDER 10/29/2007   Qualifier: Diagnosis of  By: [name] MD, Lynwood ORN    Myalgia 09/25/2013   Noncompliance with medications 02/26/2014   Obesity, unspecified    OTITIS MEDIA, SEROUS, CHRONIC 04/23/2007   Qualifier: Diagnosis of  By: Harlow MD, Ozell BRAVO    Thoracic disc disease 11/19/2018   URTICARIA 09/23/2009   Qualifier: Diagnosis of  By: Inocencio MD,  Berwyn A    Venous insufficiency 07/19/2017   Past Surgical History:  Procedure Laterality Date   CARDIAC CATHETERIZATION     PCI OF BOTH THE CIRCUMFLEX AND LEFT ANTERIOR DESCENDING ARTERY   CARDIAC CATHETERIZATION N/A 10/29/2015   Procedure: Coronary Stent Intervention;  Surgeon: Lonni Hanson, MD;  Location: [facility] INVASIVE CV LAB;  Service: Cardiovascular;  Laterality: N/A;   CARDIAC CATHETERIZATION N/A 10/29/2015   Procedure: Coronary/Graft Angiography;  Surgeon: Lonni Hanson, MD;  Location: [facility] INVASIVE CV LAB;  Service: Cardiovascular;  Laterality: N/A;   CARDIAC CATHETERIZATION N/A 10/29/2015   Procedure: Intravascular Pressure Wire/FFR Study;  Surgeon: Lonni Hanson, MD;  Location: [facility]  INVASIVE CV LAB;  Service: Cardiovascular;  Laterality: N/A;   CESAREAN SECTION     ENDOVASCULAR STENT INSERTION N/A 01/22/2016   Procedure: ABDOMINAL AORTIC ENDOVASCULAR STENT GRAFT INSERTION;  Surgeon: Krystal JULIANNA Doing, MD;  Location: [facility] OR;  Service: Vascular;  Laterality: N/A;   HEART STENT  04-2010  and  Jun 07, 2013   X 3   IR EMBO ARTERIAL NOT HEMORR HEMANG INC GUIDE ROADMAPPING  07/17/2023   IR IMAGING GUIDED PORT INSERTION  05/17/2023   IR RADIOLOGIST EVAL & MGMT  06/21/2023   LEFT HEART CATH AND CORONARY ANGIOGRAPHY N/A 04/03/2019   Procedure: LEFT HEART CATH AND CORONARY ANGIOGRAPHY;  Surgeon: Hanson Lonni, MD;  Location: [facility] INVASIVE CV LAB;  Service: Cardiovascular;  Laterality: N/A;   LEFT HEART CATHETERIZATION WITH CORONARY ANGIOGRAM N/A 06/07/2013   Procedure: LEFT HEART CATHETERIZATION WITH CORONARY ANGIOGRAM;  Surgeon: Lonni JONETTA Cash, MD;  Location: [facility] CATH LAB;  Service: Cardiovascular;  Laterality: N/A;   LEFT HEART CATHETERIZATION WITH CORONARY ANGIOGRAM N/A 02/25/2014   Procedure: LEFT HEART CATHETERIZATION WITH CORONARY ANGIOGRAM;  Surgeon: Debby DELENA Sor, MD;  Location: [facility] CATH LAB;  Service: Cardiovascular;  Laterality: N/A;   LUMBAR FUSION  01/2007   DR. GUST...3-LEVEL WITH FIXATION   OOPHORECTOMY     BSO? pt.unsure   PARATHYROIDECTOMY     SPINE SURGERY     THYROIDECTOMY     TOTAL ABDOMINAL HYSTERECTOMY      reports that she quit smoking about 37 years ago. Her smoking use included cigarettes. She started smoking about 67 years ago. She has never used smokeless tobacco. She reports that she does not drink alcohol and does not use drugs. family history includes Arthritis in her maternal aunt; Breast cancer in her maternal aunt; Diabetes in her brother and maternal aunt; Heart attack (age of onset: 77) in her mother; Heart attack (age of onset: 90) in her father; Heart disease in her father and mother. Allergies  Allergen Reactions   Crestor  [Rosuvastatin  Calcium ]  Other (See Comments)    Feeling poor   Prilosec [Omeprazole ] Other (See Comments)    Chest pain   Miconazole Nitrate Hives    REACTION: hives   Augmentin  [Amoxicillin -Pot Clavulanate] Hives, Itching and Rash    Has patient had a PCN reaction causing immediate rash, facial/tongue/throat swelling, SOB or lightheadedness with hypotension:unsure Has patient had a PCN reaction causing severe rash involving mucus membranes or skin necrosis:unsure Has patient had a PCN reaction that required hospitalization:No Has patient had a PCN reaction occurring within the last 10 years:NO If all of the above answers are NO, then may proceed with Cephalosporin use. Has patient had a PCN reaction causing immediate rash, facial/tongue/throat swelling, SOB or lightheadedness with hypotension:unsure Has patient had a PCN reaction causing severe rash involving mucus membranes or skin necrosis:unsure Has patient  had a PCN reaction that required hospitalization:No Has patient had a PCN reaction occurring within the last 10 years:NO If all of the above answers are NO, then may proceed with Cephalosporin use.    Doxycycline  Other (See Comments)    REACTION: gi upset   Current Outpatient Medications on File Prior to Visit  Medication Sig Dispense Refill   alendronate  (FOSAMAX ) 70 MG tablet Take 1 tablet (70 mg total) by mouth every 7 (seven) days. Take with a full glass of water on an empty stomach. 12 tablet 3   aspirin  81 MG EC tablet Take 81 mg by mouth daily.      atenolol  (TENORMIN ) 25 MG tablet Take 0.5 tablets (12.5 mg total) by mouth daily. 90 tablet 0   Blood Pressure Monitoring (OMRON 3 SERIES BP MONITOR) DEVI Use as directed. 1 each 0   Foot Care Products ([employer] GEL HEEL CUSHION WOMENS) PADS Use as directed at bedtime 2 each 5   gabapentin  (NEURONTIN ) 300 MG capsule Take 1-2 tab by mouth at bedtime for pain and sleep (Patient taking differently: Take 300 mg by mouth as needed. Take 1-2 tab by mouth at  bedtime for pain and sleep) 180 capsule 1   lidocaine -prilocaine  (EMLA ) cream Apply 1 Application topically as needed. 30 g 2   oxyCODONE  (OXY IR/ROXICODONE ) 5 MG immediate release tablet Take 1-2 tablets (5-10 mg total) by mouth every 4 (four) hours as needed for severe pain (pain score 7-10). 60 tablet 0   polyethylene glycol powder (GLYCOLAX /MIRALAX ) 17 GM/SCOOP powder Take 17 g by mouth 2 (two) times daily as needed. 3350 g 1   prochlorperazine  (COMPAZINE ) 10 MG tablet Take 1 tablet (10 mg total) by mouth every 6 (six) hours as needed. 30 tablet 2   SYNTHROID  150 MCG tablet Take 1 tablet (150 mcg total) by mouth daily before breakfast. 30 tablet 11   temazepam  (RESTORIL ) 15 MG capsule Take 1 capsule (15 mg total) by mouth at bedtime as needed for sleep. 30 capsule 2   Triamcinolone  Acetonide (ZILRETTA ) 32 MG SRER intra-articular injection 5 mL, intra-articular, bilat knees 2 each 0   No current facility-administered medications on file prior to visit.        ROS:  All others reviewed and negative.  Objective        PE:  BP 120/74   Temp 98.7 F (37.1 C)   Ht 5' 6 (1.676 m)   BMI 28.13 kg/m                 Constitutional: Pt appears mild ill               HENT: Head: NCAT.                Right Ear: External ear normal.                 Left Ear: External ear normal. Bilat tm's with mild erythema.  Max sinus areas mild tender.  Pharynx with mild erythema, no exudate               Eyes: . Pupils are equal, round, and reactive to light. Conjunctivae and EOM are normal               Nose: without d/c or deformity               Neck: Neck supple. Gross normal ROM  Cardiovascular: Normal rate and regular rhythm.                 Pulmonary/Chest: Effort normal and breath sounds without rales or wheezing.                Left knee with marked bony degenerative changes, small effusion               Neurological: Pt is alert. At baseline orientation, motor grossly intact                Skin: Skin is warm. No rashes, no other new lesions, LE edema - none               Psychiatric: Pt behavior is normal without agitation   Micro: none  Cardiac tracings I have personally interpreted today:  none  Pertinent Radiological findings (summarize): none   Lab Results  Component Value Date   WBC 7.0 09/26/2023   HGB 13.7 09/26/2023   HCT 42.0 09/26/2023   PLT 229 09/26/2023   GLUCOSE 100 (H) 09/26/2023   CHOL 119 03/22/2023   TRIG 62 03/22/2023   HDL 22 (L) 03/22/2023   LDLDIRECT 131.0 03/27/2015   LDLCALC 85 03/22/2023   ALT 6 09/26/2023   AST 10 (L) 09/26/2023   NA 140 09/26/2023   K 4.2 09/26/2023   CL 105 09/26/2023   CREATININE 0.64 09/26/2023   BUN 11 09/26/2023   CO2 29 09/26/2023   TSH 2.260 08/29/2023   INR 1.2 03/27/2023   HGBA1C 5.5 02/21/2023   MICROALBUR 2.0 (H) 11/06/2009   POCT - Covid - neg               Flu - neg  Assessment/Plan:  [name] is a 78 y.o. Black or African American [2] female with  has a past medical history of AAA (abdominal aortic aneurysm) (HCC), Acute ischemic colitis (HCC) (07/29/2010), Acute respiratory infection (06/09/2014), Acute sinus infection (05/14/2015), Allergic rhinitis, cause unspecified, Angioedema of lips (06/03/2014), Anxiety state, unspecified, Arthritis of left hip (04/16/2015), Bilateral hearing loss (07/19/2017), Chronic LBP, Coronary artery disease, Coronary artery disease involving native heart without angina pectoris (07/31/2006), Degeneration of lumbar or lumbosacral intervertebral disc, Depressive disorder, not elsewhere classified, Diabetes (HCC) (11/03/2006), Diabetes mellitus, Frequent urination (05/08/2014), Gout (08/22/2013), History of endovascular stent graft for abdominal aortic aneurysm (AAA) (01/22/2016), History of radiation therapy, Hyperlipidemia, Hypertension, Hypothyroidism, Iron  deficiency anemia (06/13/2017), Lower GI bleed (06/2010), Lumbar radiculopathy (05/15/2015), MENOPAUSAL  DISORDER (10/29/2007), Myalgia (09/25/2013), Noncompliance with medications (02/26/2014), Obesity, unspecified, OTITIS MEDIA, SEROUS, CHRONIC (04/23/2007), Thoracic disc disease (11/19/2018), URTICARIA (09/23/2009), and Venous insufficiency (07/19/2017).  Acute sinus infection Mild to mod, for antibx course zpack, and cough med prn,  to f/u any worsening symptoms or concerns  Arthritis of left knee With flare pain but no falls, ok for toradol  30 mg IM  Diabetes (HCC) Lab Results  Component Value Date   HGBA1C 5.5 02/21/2023   Stable, pt to continue current medical treatment  - diet,wt control   Essential hypertension BP Readings from Last 3 Encounters:  10/03/23 120/74  09/26/23 119/63  09/26/23 138/84   Stable, pt to continue medical treatment tenormin  12.5 mg qd  Followup: Return if symptoms worsen or fail to improve.  Lynwood Rush, MD 10/03/2023 7:33 PM [facility]  Primary Care - [facility]

## 2023-10-03 NOTE — Assessment & Plan Note (Signed)
 With flare pain but no falls, ok for toradol  30 mg IM

## 2023-10-03 NOTE — Assessment & Plan Note (Signed)
Mild to mod, for antibx course zpack, and cough med prn,  to f/u any worsening symptoms or concerns

## 2023-10-03 NOTE — Patient Instructions (Signed)
 Your Covid and Flu testing is negative  Please take all new medication as prescribed  - the antibiotic and cough medicine  Please continue all other medications as before, and refills have been done if requested.  Please have the pharmacy call with any other refills you may need.  Please keep your appointments with your specialists as you may have planned

## 2023-10-03 NOTE — Assessment & Plan Note (Signed)
 Lab Results  Component Value Date   HGBA1C 5.5 02/21/2023   Stable, pt to continue current medical treatment  - diet, wt control

## 2023-10-16 ENCOUNTER — Telehealth: Payer: Self-pay

## 2023-10-16 NOTE — Telephone Encounter (Unsigned)
 Copied from CRM [id number]. Topic: Clinical - Medication Question >> Oct 16, 2023 12:54 PM Chiquita SQUIBB wrote: Reason for CRM: Fairy from [employer] is calling in requesting a list of the patients active medications faxed to [phone] and a good call back number is [phone].

## 2023-10-17 ENCOUNTER — Other Ambulatory Visit: Payer: Self-pay | Admitting: Internal Medicine

## 2023-10-17 ENCOUNTER — Ambulatory Visit (HOSPITAL_COMMUNITY)
Admission: RE | Admit: 2023-10-17 | Discharge: 2023-10-17 | Disposition: A | Discharge status: Home / Self Care | Source: Ambulatory Visit | Attending: Physician Assistant | Admitting: Physician Assistant

## 2023-10-17 DIAGNOSIS — C3490 Malignant neoplasm of unspecified part of unspecified bronchus or lung: Secondary | ICD-10-CM | POA: Diagnosis not present

## 2023-10-17 DIAGNOSIS — I700 Atherosclerosis of aorta: Secondary | ICD-10-CM | POA: Diagnosis not present

## 2023-10-17 MED ORDER — ATENOLOL 25 MG PO TABS: 12.5000 mg | ORAL_TABLET | Freq: Every day | ORAL | 3 refills | Status: DC

## 2023-10-17 MED ORDER — SYNTHROID 150 MCG PO TABS
150.0000 ug | ORAL_TABLET | Freq: Every day | ORAL | 3 refills | Status: DC
Start: 2023-10-17 — End: 2023-10-19

## 2023-10-17 MED ORDER — ALENDRONATE SODIUM 70 MG PO TABS
70.0000 mg | ORAL_TABLET | ORAL | 3 refills | Status: DC
Start: 2023-10-17 — End: 2023-10-19

## 2023-10-17 MED ORDER — IOHEXOL 300 MG/ML  SOLN
75.0000 mL | Freq: Once | INTRAMUSCULAR | Status: AC | PRN
  Administered 2023-10-17: 75 mL via INTRAVENOUS

## 2023-10-19 ENCOUNTER — Other Ambulatory Visit: Payer: Self-pay | Admitting: Internal Medicine

## 2023-10-19 DIAGNOSIS — C3490 Malignant neoplasm of unspecified part of unspecified bronchus or lung: Secondary | ICD-10-CM

## 2023-10-19 DIAGNOSIS — G8929 Other chronic pain: Secondary | ICD-10-CM

## 2023-10-19 DIAGNOSIS — C3411 Malignant neoplasm of upper lobe, right bronchus or lung: Secondary | ICD-10-CM

## 2023-10-19 DIAGNOSIS — M170 Bilateral primary osteoarthritis of knee: Secondary | ICD-10-CM

## 2023-10-19 DIAGNOSIS — G893 Neoplasm related pain (acute) (chronic): Secondary | ICD-10-CM

## 2023-10-19 DIAGNOSIS — Z515 Encounter for palliative care: Secondary | ICD-10-CM

## 2023-10-19 MED ORDER — TEMAZEPAM 15 MG PO CAPS: 15.0000 mg | ORAL_CAPSULE | Freq: Every evening | ORAL | 5 refills | Status: AC | PRN

## 2023-10-19 MED ORDER — POLYETHYLENE GLYCOL 3350 17 GM/SCOOP PO POWD: 17.0000 g | Freq: Two times a day (BID) | ORAL | 1 refills | Status: AC | PRN

## 2023-10-19 MED ORDER — ALENDRONATE SODIUM 70 MG PO TABS: 70.0000 mg | ORAL_TABLET | ORAL | 3 refills | Status: AC

## 2023-10-19 MED ORDER — OMRON 3 SERIES BP MONITOR DEVI: 1.0000 | Freq: Every day | 0 refills | Status: AC

## 2023-10-19 MED ORDER — LIDOCAINE-PRILOCAINE 2.5-2.5 % EX CREA: 1.0000 | TOPICAL_CREAM | CUTANEOUS | 2 refills | Status: AC | PRN

## 2023-10-19 MED ORDER — ASPIRIN 81 MG PO TBEC: 81.0000 mg | DELAYED_RELEASE_TABLET | Freq: Every day | ORAL | 3 refills | Status: AC

## 2023-10-19 MED ORDER — PROCHLORPERAZINE MALEATE 10 MG PO TABS: 10.0000 mg | ORAL_TABLET | Freq: Four times a day (QID) | ORAL | 2 refills | Status: AC | PRN

## 2023-10-19 MED ORDER — CVS GEL HEEL CUSHION WOMENS PADS: MEDICATED_PAD | 5 refills | Status: AC

## 2023-10-19 MED ORDER — SYNTHROID 150 MCG PO TABS: 150.0000 ug | ORAL_TABLET | Freq: Every day | ORAL | 3 refills | Status: DC

## 2023-10-19 MED ORDER — GABAPENTIN 300 MG PO CAPS: ORAL_CAPSULE | ORAL | 1 refills | Status: AC

## 2023-10-19 MED ORDER — ATENOLOL 25 MG PO TABS: 12.5000 mg | ORAL_TABLET | Freq: Every day | ORAL | 3 refills | Status: DC

## 2023-10-19 NOTE — Telephone Encounter (Signed)
 Copied from CRM #[id number]. Topic: Clinical - Medication Refill >> Oct 19, 2023  9:11 AM Mercedes MATSU wrote: Medication: alendronate  (FOSAMAX ) 70 MG tablet aspirin  81 MG EC tablet  atenolol  (TENORMIN ) 25 MG tablet Blood Pressure Monitoring (OMRON 3 SERIES BP MONITOR) DEVI Foot Care Products ([employer] GEL HEEL CUSHION WOMENS) PADS  gabapentin  (NEURONTIN ) 300 MG capsule lidocaine -prilocaine  (EMLA ) cream oxyCODONE  (OXY IR/ROXICODONE ) 5 MG immediate release tablet polyethylene glycol powder (GLYCOLAX /MIRALAX ) 17 GM/SCOOP powder  prochlorperazine  (COMPAZINE ) 10 MG tablet  SYNTHROID  150 MCG tablet  temazepam  (RESTORIL ) 15 MG capsule Triamcinolone  Acetonide (ZILRETTA ) 32 MG SRER intra-articular injection   Has the patient contacted their pharmacy? Yes (Agent: If no, request that the patient contact the pharmacy for the refill. If patient does not wish to contact the pharmacy document the reason why and proceed with request.) (Agent: If yes, when and what did the pharmacy advise?)  This is the patient's preferred pharmacy:   [facility] - [place] - [address] [place] [address] Phone: [phone] Fax: [phone]  Is this the correct pharmacy for this prescription? Yes If no, delete pharmacy and type the correct one.   Has the prescription been filled recently? Yes  Is the patient out of the medication? Yes  Has the patient been seen for an appointment in the last year OR does the patient have an upcoming appointment? Yes  Can we respond through MyChart? Yes  Agent: Please be advised that Rx refills may take up to 3 business days. We ask that you follow-up with your pharmacy.

## 2023-10-20 NOTE — Telephone Encounter (Signed)
 Med list has been faxed today.

## 2023-10-23 ENCOUNTER — Other Ambulatory Visit: Payer: Self-pay | Admitting: Internal Medicine

## 2023-10-23 DIAGNOSIS — Z515 Encounter for palliative care: Secondary | ICD-10-CM

## 2023-10-23 DIAGNOSIS — G893 Neoplasm related pain (acute) (chronic): Secondary | ICD-10-CM

## 2023-10-23 DIAGNOSIS — C3411 Malignant neoplasm of upper lobe, right bronchus or lung: Secondary | ICD-10-CM

## 2023-10-23 NOTE — Telephone Encounter (Signed)
 Copied from CRM [id number]. Topic: Clinical - Medication Refill >> Oct 23, 2023  5:28 PM [place] J wrote: Medication:  oxyCODONE  (OXY IR/ROXICODONE ) 5 MG immediate release tablet   Has the patient contacted their pharmacy? Yes (Agent: If no, request that the patient contact the pharmacy for the refill. If patient does not wish to contact the pharmacy document the reason why and proceed with request.) (Agent: If yes, when and what did the pharmacy advise?)  This is the patient's preferred pharmacy:  [facility] - [place], [place] - [address] [place] [address] Phone: [phone] Fax: [phone]  Is this the correct pharmacy for this prescription? Yes If no, delete pharmacy and type the correct one.   Has the prescription been filled recently? No  Is the patient out of the medication? Yes  Has the patient been seen for an appointment in the last year OR does the patient have an upcoming appointment? Yes  Can we respond through MyChart? Yes  Agent: Please be advised that Rx refills may take up to 3 business days. We ask that you follow-up with your pharmacy.

## 2023-10-24 ENCOUNTER — Inpatient Hospital Stay (HOSPITAL_BASED_OUTPATIENT_CLINIC_OR_DEPARTMENT_OTHER): Admitting: Internal Medicine

## 2023-10-24 ENCOUNTER — Inpatient Hospital Stay

## 2023-10-24 ENCOUNTER — Inpatient Hospital Stay (HOSPITAL_BASED_OUTPATIENT_CLINIC_OR_DEPARTMENT_OTHER): Admitting: Nurse Practitioner

## 2023-10-24 ENCOUNTER — Inpatient Hospital Stay: Attending: Internal Medicine

## 2023-10-24 ENCOUNTER — Encounter: Payer: Self-pay | Admitting: Nurse Practitioner

## 2023-10-24 VITALS — BP 134/86 | HR 73 | Temp 97.6°F | Resp 17 | Ht 66.0 in | Wt 170.1 lb

## 2023-10-24 DIAGNOSIS — G893 Neoplasm related pain (acute) (chronic): Secondary | ICD-10-CM | POA: Diagnosis not present

## 2023-10-24 DIAGNOSIS — C3411 Malignant neoplasm of upper lobe, right bronchus or lung: Secondary | ICD-10-CM | POA: Diagnosis not present

## 2023-10-24 DIAGNOSIS — Z515 Encounter for palliative care: Secondary | ICD-10-CM

## 2023-10-24 DIAGNOSIS — R530 Neoplastic (malignant) related fatigue: Secondary | ICD-10-CM | POA: Diagnosis not present

## 2023-10-24 DIAGNOSIS — Z5112 Encounter for antineoplastic immunotherapy: Secondary | ICD-10-CM | POA: Insufficient documentation

## 2023-10-24 DIAGNOSIS — G4709 Other insomnia: Secondary | ICD-10-CM

## 2023-10-24 LAB — CBC WITH DIFFERENTIAL (CANCER CENTER ONLY)
Abs Immature Granulocytes: 0.03 K/uL (ref 0.00–0.07)
Basophils Absolute: 0 K/uL (ref 0.0–0.1)
Basophils Relative: 0 %
Eosinophils Absolute: 0.1 K/uL (ref 0.0–0.5)
Eosinophils Relative: 1 %
HCT: 42.6 % (ref 36.0–46.0)
Hemoglobin: 14.2 g/dL (ref 12.0–15.0)
Immature Granulocytes: 1 %
Lymphocytes Relative: 17 %
Lymphs Abs: 0.9 K/uL (ref 0.7–4.0)
MCH: 29.8 pg (ref 26.0–34.0)
MCHC: 33.3 g/dL (ref 30.0–36.0)
MCV: 89.5 fL (ref 80.0–100.0)
Monocytes Absolute: 0.7 K/uL (ref 0.1–1.0)
Monocytes Relative: 13 %
Neutro Abs: 3.4 K/uL (ref 1.7–7.7)
Neutrophils Relative %: 68 %
Platelet Count: 238 K/uL (ref 150–400)
RBC: 4.76 MIL/uL (ref 3.87–5.11)
RDW: 13.5 % (ref 11.5–15.5)
WBC Count: 5 K/uL (ref 4.0–10.5)
nRBC: 0 % (ref 0.0–0.2)

## 2023-10-24 LAB — CMP (CANCER CENTER ONLY)
ALT: 8 U/L (ref 0–44)
AST: 11 U/L — ABNORMAL LOW (ref 15–41)
Albumin: 3.5 g/dL (ref 3.5–5.0)
Alkaline Phosphatase: 89 U/L (ref 38–126)
Anion gap: 6 (ref 5–15)
BUN: 8 mg/dL (ref 8–23)
CO2: 30 mmol/L (ref 22–32)
Calcium: 8.7 mg/dL — ABNORMAL LOW (ref 8.9–10.3)
Chloride: 104 mmol/L (ref 98–111)
Creatinine: 0.49 mg/dL (ref 0.44–1.00)
GFR, Estimated: >60 mL/min (ref >60)
Glucose, Bld: 98 mg/dL (ref 70–99)
Potassium: 3.5 mmol/L (ref 3.5–5.1)
Sodium: 140 mmol/L (ref 135–145)
Total Bilirubin: 0.9 mg/dL (ref 0.0–1.2)
Total Protein: 6.9 g/dL (ref 6.5–8.1)

## 2023-10-24 MED ORDER — SODIUM CHLORIDE 0.9 % IV SOLN
1500.0000 mg | Freq: Once | INTRAVENOUS | Status: AC
  Administered 2023-10-24: 1500 mg via INTRAVENOUS
  Filled 2023-10-24: qty 30

## 2023-10-24 MED ORDER — SODIUM CHLORIDE 0.9 % IV SOLN: INTRAVENOUS | Status: DC

## 2023-10-24 NOTE — Progress Notes (Signed)
 [facility] [phone]   [phone]  OFFICE PROGRESS NOTE  [name], MD [address] [place] [address]  DIAGNOSIS: Stage IIIB (T4, N2b, M0) non-small cell lung cancer, squamous cell carcinoma. She presented with large pleural-based peripheral right upper lobe lung mass measuring up to 8.2 cm in size with large right paratracheal node with low level hypermetabolic uptake.  Diagnosed in March 2025.   PDL1: 15%   PRIOR THERAPY: concurrent chemoradiation with carboplatin  for an AUC of 2 and taxol  45 mg/m2. First dose expected on 05/08/23.  Status post 4 cycles.  Last dose was given on 05/30/2023.   CURRENT THERAPY: Consolidation treatment with immunotherapy with Imfinzi  1500 Mg IV every 4 weeks.  First dose 07/03/2023.  Status post 4 cycles.  INTERVAL HISTORY: [name] 78 y.o. female returns to the clinic today for follow-up visit accompanied by her daughter.  Discussed the use of AI scribe software for clinical note transcription with the patient, who gave verbal consent to proceed.  History of Present Illness [name] is a 78 year old female with stage 3B lung cancer who presents for evaluation before starting cycle number five of chemotherapy. She is accompanied by her daughter.  She is undergoing chemotherapy every four weeks and has completed four cycles. She is here for evaluation before starting cycle number five, including a repeat CT scan of the chest for restaging of her disease.  She feels queasy but has no other complaints or side effects from the treatment. No chest pain, breathing issues, diarrhea, or rash. She notes that the tumor is 'going away' and 'improving.'  Her cancer has previously been described as stage 3B.   MEDICAL HISTORY: Past Medical History:  Diagnosis Date   AAA (abdominal aortic aneurysm)    a. s/p stent graft repair 01/2016.   Acute ischemic colitis 07/29/2010   Diagnosed June, 2012  characterized by acute lower GI bleeding, spontaneously resolved.    Acute respiratory infection 06/09/2014   Acute sinus infection 05/14/2015   Allergic rhinitis, cause unspecified    Angioedema of lips 06/03/2014   Anxiety state, unspecified    Arthritis of left hip 04/16/2015   Bilateral hearing loss 07/19/2017   Chronic LBP    Coronary artery disease    a. inferior MI 1998 s/p PCI of RCA. b. stenting of Cx 04/2010. c. DES to prox LAD 05/2013. d. DES x 3 in 10/2015. // Myoview  07/2018:  EF 59, normal perfusion, low risk    Coronary artery disease involving native heart without angina pectoris 07/31/2006   Qualifier: Diagnosis of  By: Harlow MD, Ozell BRAVO  ANGIOGRAPHIC DATA:  1. Ventriculography done in the RAO projection reveals a small wall  motion abnormality in the mid inferior wall. Ejection fraction  would be estimated at around 50%.  2. The right coronary artery has some progressive disease of about 60-  80% in the proximal mid segment. The distal vessel appears to be  50-70% narrowing althou   Degeneration of lumbar or lumbosacral intervertebral disc    Depressive disorder, not elsewhere classified    Diabetes (HCC) 11/03/2006   Qualifier: Diagnosis of  By: [name] MD, Lynwood ORN    Diabetes mellitus    TYPE II   Frequent urination 05/08/2014   Gout 08/22/2013   History of endovascular stent graft for abdominal aortic aneurysm (AAA) 01/22/2016   History of radiation therapy    Right lung-04/19/23-05/29/23-Dr. [name]  Hyperlipidemia    Hypertension    Hypothyroidism    Iron  deficiency anemia 06/13/2017   Lower GI bleed 06/2010   Diverticular bleed   Lumbar radiculopathy 05/15/2015   MENOPAUSAL DISORDER 10/29/2007   Qualifier: Diagnosis of  By: [name] MD, Lynwood ORN    Myalgia 09/25/2013   Noncompliance with medications 02/26/2014   Obesity, unspecified    OTITIS MEDIA, SEROUS, CHRONIC 04/23/2007   Qualifier: Diagnosis of  By: Harlow MD, Ozell BRAVO    Thoracic disc disease  11/19/2018   URTICARIA 09/23/2009   Qualifier: Diagnosis of  By: Inocencio MD, Berwyn A    Venous insufficiency 07/19/2017    ALLERGIES:  is allergic to crestor  [rosuvastatin  calcium ], prilosec [omeprazole ], miconazole nitrate, augmentin  [amoxicillin -pot clavulanate], and doxycycline .  MEDICATIONS:  Current Outpatient Medications  Medication Sig Dispense Refill   alendronate  (FOSAMAX ) 70 MG tablet Take 1 tablet (70 mg total) by mouth every 7 (seven) days. Take with a full glass of water on an empty stomach. 12 tablet 3   aspirin  EC 81 MG tablet Take 1 tablet (81 mg total) by mouth daily. 100 tablet 3   atenolol  (TENORMIN ) 25 MG tablet Take 0.5 tablets (12.5 mg total) by mouth daily. 45 tablet 3   Blood Pressure Monitoring (OMRON 3 SERIES BP MONITOR) DEVI Use as directed. 1 each 0   Foot Care Products ([employer] GEL HEEL CUSHION WOMENS) PADS Use as directed at bedtime 2 each 5   gabapentin  (NEURONTIN ) 300 MG capsule Take 1-2 tab by mouth at bedtime for pain and sleep 180 capsule 1   lidocaine -prilocaine  (EMLA ) cream Apply 1 Application topically as needed. 30 g 2   oxyCODONE  (OXY IR/ROXICODONE ) 5 MG immediate release tablet Take 1-2 tablets (5-10 mg total) by mouth every 4 (four) hours as needed for severe pain (pain score 7-10). 60 tablet 0   polyethylene glycol powder (GLYCOLAX /MIRALAX ) 17 GM/SCOOP powder Take 17 g by mouth 2 (two) times daily as needed. 3350 g 1   prochlorperazine  (COMPAZINE ) 10 MG tablet Take 1 tablet (10 mg total) by mouth every 6 (six) hours as needed. 30 tablet 2   SYNTHROID  150 MCG tablet Take 1 tablet (150 mcg total) by mouth daily before breakfast. 90 tablet 3   temazepam  (RESTORIL ) 15 MG capsule Take 1 capsule (15 mg total) by mouth at bedtime as needed for sleep. 30 capsule 5   Triamcinolone  Acetonide (ZILRETTA ) 32 MG SRER intra-articular injection 5 mL, intra-articular, bilat knees 2 each 0   No current facility-administered medications for this visit.    SURGICAL  HISTORY:  Past Surgical History:  Procedure Laterality Date   CARDIAC CATHETERIZATION     PCI OF BOTH THE CIRCUMFLEX AND LEFT ANTERIOR DESCENDING ARTERY   CARDIAC CATHETERIZATION N/A 10/29/2015   Procedure: Coronary Stent Intervention;  Surgeon: Lonni Hanson, MD;  Location: [facility] INVASIVE CV LAB;  Service: Cardiovascular;  Laterality: N/A;   CARDIAC CATHETERIZATION N/A 10/29/2015   Procedure: Coronary/Graft Angiography;  Surgeon: Lonni Hanson, MD;  Location: [facility] INVASIVE CV LAB;  Service: Cardiovascular;  Laterality: N/A;   CARDIAC CATHETERIZATION N/A 10/29/2015   Procedure: Intravascular Pressure Wire/FFR Study;  Surgeon: Lonni Hanson, MD;  Location: [facility] INVASIVE CV LAB;  Service: Cardiovascular;  Laterality: N/A;   CESAREAN SECTION     ENDOVASCULAR STENT INSERTION N/A 01/22/2016   Procedure: ABDOMINAL AORTIC ENDOVASCULAR STENT GRAFT INSERTION;  Surgeon: Krystal JULIANNA Doing, MD;  Location: [facility] OR;  Service: Vascular;  Laterality: N/A;   HEART STENT  [phone]  and  Jun 07, 2013   X 3   IR EMBO ARTERIAL NOT HEMORR HEMANG INC GUIDE ROADMAPPING  07/17/2023   IR IMAGING GUIDED PORT INSERTION  05/17/2023   IR RADIOLOGIST EVAL & MGMT  06/21/2023   LEFT HEART CATH AND CORONARY ANGIOGRAPHY N/A 04/03/2019   Procedure: LEFT HEART CATH AND CORONARY ANGIOGRAPHY;  Surgeon: Mady Bruckner, MD;  Location: [facility] INVASIVE CV LAB;  Service: Cardiovascular;  Laterality: N/A;   LEFT HEART CATHETERIZATION WITH CORONARY ANGIOGRAM N/A 06/07/2013   Procedure: LEFT HEART CATHETERIZATION WITH CORONARY ANGIOGRAM;  Surgeon: Bruckner JONETTA Cash, MD;  Location: [facility] CATH LAB;  Service: Cardiovascular;  Laterality: N/A;   LEFT HEART CATHETERIZATION WITH CORONARY ANGIOGRAM N/A 02/25/2014   Procedure: LEFT HEART CATHETERIZATION WITH CORONARY ANGIOGRAM;  Surgeon: Debby DELENA Sor, MD;  Location: [facility] CATH LAB;  Service: Cardiovascular;  Laterality: N/A;   LUMBAR FUSION  01/2007   DR. GUST...3-LEVEL WITH FIXATION   OOPHORECTOMY     BSO?  pt.unsure   PARATHYROIDECTOMY     SPINE SURGERY     THYROIDECTOMY     TOTAL ABDOMINAL HYSTERECTOMY      REVIEW OF SYSTEMS:  Constitutional: positive for fatigue Eyes: negative Ears, nose, mouth, throat, and face: negative Respiratory: positive for dyspnea on exertion Cardiovascular: negative Gastrointestinal: negative Genitourinary:negative Integument/breast: negative Hematologic/lymphatic: negative Musculoskeletal:positive for arthralgias and muscle weakness Neurological: negative Behavioral/Psych: negative Endocrine: negative Allergic/Immunologic: negative   PHYSICAL EXAMINATION: General appearance: alert, cooperative, fatigued, and no distress Head: Normocephalic, without obvious abnormality, atraumatic Neck: no adenopathy, no JVD, supple, symmetrical, trachea midline, and thyroid  not enlarged, symmetric, no tenderness/mass/nodules Lymph nodes: Cervical, supraclavicular, and axillary nodes normal. Resp: clear to auscultation bilaterally Back: symmetric, no curvature. ROM normal. No CVA tenderness. Cardio: regular rate and rhythm, S1, S2 normal, no murmur, click, rub or gallop GI: soft, non-tender; bowel sounds normal; no masses,  no organomegaly Extremities: extremities normal, atraumatic, no cyanosis or edema Neurologic: Alert and oriented X 3, normal strength and tone. Normal symmetric reflexes. Normal coordination and gait  ECOG PERFORMANCE STATUS: 1 - Symptomatic but completely ambulatory  Blood pressure 134/86, pulse 73, temperature 97.6 F (36.4 C), resp. rate 17, height 5' 6 (1.676 m), weight 170 lb 1.6 oz (77.2 kg), SpO2 98%.  LABORATORY DATA: Lab Results  Component Value Date   WBC 5.0 10/24/2023   HGB 14.2 10/24/2023   HCT 42.6 10/24/2023   MCV 89.5 10/24/2023   PLT 238 10/24/2023      Chemistry      Component Value Date/Time   NA 140 09/26/2023 1046   NA 141 06/08/2021 1606   K 4.2 09/26/2023 1046   CL 105 09/26/2023 1046   CO2 29 09/26/2023 1046    BUN 11 09/26/2023 1046   BUN 4 (L) 06/08/2021 1606   CREATININE 0.64 09/26/2023 1046      Component Value Date/Time   CALCIUM  8.5 (L) 09/26/2023 1046   ALKPHOS 86 09/26/2023 1046   AST 10 (L) 09/26/2023 1046   ALT 6 09/26/2023 1046   BILITOT 0.5 09/26/2023 1046       RADIOGRAPHIC STUDIES: CT Chest W Contrast Result Date: 10/17/2023 CLINICAL DATA:  Non-small cell lung cancer (NSCLC), non-metastatic, assess treatment response. * Tracking Code: BO * EXAM: CT CHEST WITH CONTRAST TECHNIQUE: Multidetector CT imaging of the chest was performed during intravenous contrast administration. RADIATION DOSE REDUCTION: This exam was performed according to the departmental dose-optimization program which includes automated exposure control, adjustment of the mA and/or kV according to patient size  and/or use of iterative reconstruction technique. CONTRAST:  75mL OMNIPAQUE  IOHEXOL  300 MG/ML  SOLN COMPARISON:  CT scan chest from 06/15/2023. FINDINGS: Cardiovascular: Normal cardiac size. No pericardial effusion. No aortic aneurysm. There are coronary artery calcifications, in keeping with coronary artery disease. There are also mild peripheral atherosclerotic vascular calcifications of thoracic aorta and its major branches. Mediastinum/Nodes: Visualized thyroid  gland appears grossly unremarkable. No solid / cystic mediastinal masses. The esophagus is nondistended precluding optimal assessment. There are few mildly prominent mediastinal lymph nodes, which do not meet the size criteria for lymphadenopathy and appear grossly similar to the prior study. No axillary or hilar lymphadenopathy by size criteria. Lungs/Pleura: The central tracheo-bronchial tree is patent. Redemonstration of previously seen homogeneous enhancing pleural thickening along the right upper lateral hemithorax which exhibit central cavitary component and small air-fluid level. The pleural thickening as well as cavitary component appears slightly  decreased in size since the prior study. Redemonstration of peripheral/subpleural reticulations and ground-glass opacities in the right upper lobe, grossly similar to the prior study as well and likely sequela of prior radiation. No new mass or consolidation. No pleural effusion or pneumothorax. No suspicious lung nodules. Upper Abdomen: There is a stable cyst in the left hepatic lobe measuring up to 0.2 x 2.8 cm. There is a partially imaged smaller cyst in the inferior right hepatic lobe. Remaining visualized upper abdominal viscera within normal limits. Musculoskeletal: A CT Port-a-Cath is seen in the right upper chest wall with the catheter terminating in the cavo-atrial junction region. Visualized soft tissues of the chest wall are otherwise grossly unremarkable. No suspicious osseous lesions. There are moderate multilevel degenerative changes in the visualized spine. Extensive posterior spinal fixation of T4 through upper lumbar spine. IMPRESSION: 1. Redemonstration of pleural thickening along the right upper lateral hemithorax with central cavitary component with small air-fluid level. The pleural thickening as well as cavitary mass appears slightly decreased in size since the prior study. No new mass or consolidation. No suspicious lung nodule. 2. Multiple other nonacute observations, as described above. Aortic Atherosclerosis (ICD10-I70.0). Electronically Signed   By: Ree Molt M.D.   On: 10/17/2023 15:56    ASSESSMENT AND PLAN: This is a very pleasant 78 years old African-American female with Stage IIIB (T4, N2b, M0) non-small cell lung cancer, squamous cell carcinoma. She presented with large pleural-based peripheral right upper lobe lung mass measuring up to 8.2 cm in size with large right paratracheal node with low level hypermetabolic uptake. She underwent a course of concurrent chemoradiation with weekly carboplatin  for AUC of 2 and paclitaxel  45 Mg/M2.  Status post 4 cycles. She is currently  undergoing consolidation treatment with immunotherapy with Imfinzi  1500 Mg IV every 4 weeks status post 4 cycles. She is tolerating her treatment fairly well with no significant adverse effects. She had repeat CT scan of the chest performed recently.  I personally and independently reviewed the scan and discussed the result with the patient and her daughter.  Her scan showed further improvement of her disease with no concerning findings for progression. Assessment and Plan Assessment & Plan Stage IIIB lung cancer with intrathoracic lymph node involvement The disease is localized to the chest with no metastasis. The tumor has decreased slightly in size since the last evaluation, indicating a positive response to treatment. She reports nausea but no other significant side effects. The treatment appears effective as the disease is not progressing. - Administer cycle number five of chemotherapy. - Evaluate response to treatment with repeat CT scan  of the chest after 3 more cycles. - Provide antiemetic medication if needed. - Schedule follow-up appointment in four weeks. She was advised to call immediately if she has any concerning symptoms in the interval. The patient voices understanding of current disease status and treatment options and is in agreement with the current care plan.  All questions were answered. The patient knows to call the clinic with any problems, questions or concerns. We can certainly see the patient much sooner if necessary. The total time spent in the appointment was 30 minutes including review of chart and various tests results, discussions about plan of care and coordination of care plan .   Disclaimer: This note was dictated with voice recognition software. Similar sounding words can inadvertently be transcribed and may not be corrected upon review.

## 2023-10-24 NOTE — Patient Instructions (Signed)

## 2023-10-24 NOTE — Patient Instructions (Signed)
 [facility] - A DEPT OF [facility]  Discharge Instructions: Thank you for choosing [facility] to provide your oncology and hematology care.   If you have a lab appointment with the Cancer Center, please go directly to the Cancer Center and check in at the registration area.   Wear comfortable clothing and clothing appropriate for easy access to any Portacath or PICC line.   We strive to give you quality time with your provider. You may need to reschedule your appointment if you arrive late (15 or more minutes).  Arriving late affects you and other patients whose appointments are after yours.  Also, if you miss three or more appointments without notifying the office, you may be dismissed from the clinic at the provider's discretion.      For prescription refill requests, have your pharmacy contact our office and allow 72 hours for refills to be completed.    Today you received the following chemotherapy and/or immunotherapy agents: durvalumab (IMFINZI)       To help prevent nausea and vomiting after your treatment, we encourage you to take your nausea medication as directed.  BELOW ARE SYMPTOMS THAT SHOULD BE REPORTED IMMEDIATELY: *FEVER GREATER THAN 100.4 F (38 C) OR HIGHER *CHILLS OR SWEATING *NAUSEA AND VOMITING THAT IS NOT CONTROLLED WITH YOUR NAUSEA MEDICATION *UNUSUAL SHORTNESS OF BREATH *UNUSUAL BRUISING OR BLEEDING *URINARY PROBLEMS (pain or burning when urinating, or frequent urination) *BOWEL PROBLEMS (unusual diarrhea, constipation, pain near the anus) TENDERNESS IN MOUTH AND THROAT WITH OR WITHOUT PRESENCE OF ULCERS (sore throat, sores in mouth, or a toothache) UNUSUAL RASH, SWELLING OR PAIN  UNUSUAL VAGINAL DISCHARGE OR ITCHING   Items with * indicate a potential emergency and should be followed up as soon as possible or go to the Emergency Department if any problems should occur.  Please show the CHEMOTHERAPY ALERT CARD or  IMMUNOTHERAPY ALERT CARD at check-in to the Emergency Department and triage nurse.  Should you have questions after your visit or need to cancel or reschedule your appointment, please contact [facility] - A DEPT OF [facility]  Dept: [phone]  and follow the prompts.  Office hours are 8:00 a.m. to 4:30 p.m. Monday - Friday. Please note that voicemails left after 4:00 p.m. may not be returned until the following business day.  We are closed weekends and major holidays. You have access to a nurse at all times for urgent questions. Please call the main number to the clinic Dept: [phone] and follow the prompts.   For any non-urgent questions, you may also contact your provider using MyChart. We now offer e-Visits for anyone 39 and older to request care online for non-urgent symptoms. For details visit [email or web address].   Also download the MyChart app! Go to the app store, search "MyChart", open the app, select [place], and log in with your MyChart username and password.

## 2023-10-24 NOTE — Progress Notes (Signed)
 Palliative Medicine [facility]  [phone] [phone]   Name: [name] Date: 10/24/2023 MRN: [id number]  DOB: 06-Oct-1945  Patient Care Team: [name], MD as PCP - Diedre Wonda Sharper, MD as PCP - Cardiology (Cardiology) Tobie Tonita POUR, DO as Consulting Physician (Neurology) Lelon Glendia ONEIDA DEVONNA as Physician Assistant (Cardiology) Joane Artist RAMAN, MD as Consulting Physician (Family Medicine) Prentis Duwaine BROCKS, RN as Oncology Nurse Navigator Pickenpack-Cousar, Fannie SAILOR, NP as Nurse Practitioner [facility] Palliative Medicine)   INTERVAL HISTORY: [name] is a 78 y.o. female with  oncologic medical history including stage IIIB non-small cell lung cancer (03/2023) s/p concurrent chemoradiation with plans for immunotherapy starting 07/03/23.  Palliative is seeing patient for symptom management and goals of care.   SOCIAL HISTORY:     reports that she quit smoking about 37 years ago. Her smoking use included cigarettes. She started smoking about 67 years ago. She has never used smokeless tobacco. She reports that she does not drink alcohol and does not use drugs.  ADVANCE DIRECTIVES:  Completed on 06/26/23. [name] is designated HCPOA.   CODE STATUS: DNR  PAST MEDICAL HISTORY: Past Medical History:  Diagnosis Date   AAA (abdominal aortic aneurysm)    a. s/p stent graft repair 01/2016.   Acute ischemic colitis 07/29/2010   Diagnosed June, 2012 characterized by acute lower GI bleeding, spontaneously resolved.    Acute respiratory infection 06/09/2014   Acute sinus infection 05/14/2015   Allergic rhinitis, cause unspecified    Angioedema of lips 06/03/2014   Anxiety state, unspecified    Arthritis of left hip 04/16/2015   Bilateral hearing loss 07/19/2017   Chronic LBP    Coronary artery disease    a. inferior MI 1998 s/p PCI of RCA. b. stenting of Cx 04/2010. c. DES to prox LAD 05/2013. d. DES x 3 in  10/2015. // Myoview  07/2018:  EF 59, normal perfusion, low risk    Coronary artery disease involving native heart without angina pectoris 07/31/2006   Qualifier: Diagnosis of  By: Harlow MD, Sharper BRAVO  ANGIOGRAPHIC DATA:  1. Ventriculography done in the RAO projection reveals a small wall  motion abnormality in the mid inferior wall. Ejection fraction  would be estimated at around 50%.  2. The right coronary artery has some progressive disease of about 60-  80% in the proximal mid segment. The distal vessel appears to be  50-70% narrowing althou   Degeneration of lumbar or lumbosacral intervertebral disc    Depressive disorder, not elsewhere classified    Diabetes (HCC) 11/03/2006   Qualifier: Diagnosis of  By: [name] MD, Lynwood ORN    Diabetes mellitus    TYPE II   Frequent urination 05/08/2014   Gout 08/22/2013   History of endovascular stent graft for abdominal aortic aneurysm (AAA) 01/22/2016   History of radiation therapy    Right lung-04/19/23-05/29/23-Dr. [name]   Hyperlipidemia    Hypertension    Hypothyroidism    Iron  deficiency anemia 06/13/2017   Lower GI bleed 06/2010   Diverticular bleed   Lumbar radiculopathy 05/15/2015   MENOPAUSAL DISORDER 10/29/2007   Qualifier: Diagnosis of  By: [name] MD, Lynwood ORN    Myalgia 09/25/2013   Noncompliance with medications 02/26/2014   Obesity, unspecified    OTITIS MEDIA, SEROUS, CHRONIC 04/23/2007   Qualifier: Diagnosis of  By: Harlow MD, Sharper BRAVO    Thoracic disc disease 11/19/2018   URTICARIA 09/23/2009  Qualifier: Diagnosis of  By: Inocencio MD, Berwyn A    Venous insufficiency 07/19/2017    ALLERGIES:  is allergic to crestor  [rosuvastatin  calcium ], prilosec [omeprazole ], miconazole nitrate, augmentin  [amoxicillin -pot clavulanate], and doxycycline .  MEDICATIONS:  Current Outpatient Medications  Medication Sig Dispense Refill   alendronate  (FOSAMAX ) 70 MG tablet Take 1 tablet (70 mg total) by mouth every 7 (seven) days. Take  with a full glass of water on an empty stomach. 12 tablet 3   aspirin  EC 81 MG tablet Take 1 tablet (81 mg total) by mouth daily. 100 tablet 3   atenolol  (TENORMIN ) 25 MG tablet Take 0.5 tablets (12.5 mg total) by mouth daily. 45 tablet 3   Blood Pressure Monitoring (OMRON 3 SERIES BP MONITOR) DEVI Use as directed. 1 each 0   Foot Care Products ([employer] GEL HEEL CUSHION WOMENS) PADS Use as directed at bedtime 2 each 5   gabapentin  (NEURONTIN ) 300 MG capsule Take 1-2 tab by mouth at bedtime for pain and sleep 180 capsule 1   lidocaine -prilocaine  (EMLA ) cream Apply 1 Application topically as needed. 30 g 2   oxyCODONE  (OXY IR/ROXICODONE ) 5 MG immediate release tablet Take 1-2 tablets (5-10 mg total) by mouth every 4 (four) hours as needed for severe pain (pain score 7-10). 60 tablet 0   polyethylene glycol powder (GLYCOLAX /MIRALAX ) 17 GM/SCOOP powder Take 17 g by mouth 2 (two) times daily as needed. 3350 g 1   prochlorperazine  (COMPAZINE ) 10 MG tablet Take 1 tablet (10 mg total) by mouth every 6 (six) hours as needed. 30 tablet 2   SYNTHROID  150 MCG tablet Take 1 tablet (150 mcg total) by mouth daily before breakfast. 90 tablet 3   temazepam  (RESTORIL ) 15 MG capsule Take 1 capsule (15 mg total) by mouth at bedtime as needed for sleep. 30 capsule 5   Triamcinolone  Acetonide (ZILRETTA ) 32 MG SRER intra-articular injection 5 mL, intra-articular, bilat knees 2 each 0   No current facility-administered medications for this visit.   Facility-Administered Medications Ordered in Other Visits  Medication Dose Route Frequency Provider Last Rate Last Admin   0.9 %  sodium chloride  infusion   Intravenous Continuous Sherrod Sherrod, MD   Stopped at 10/24/23 1413    VITAL SIGNS: There were no vitals taken for this visit. There were no vitals filed for this visit.  Estimated body mass index is 27.45 kg/m as calculated from the following:   Height as of an earlier encounter on 10/24/23: 5' 6 (1.676 m).    Weight as of an earlier encounter on 10/24/23: 170 lb 1.6 oz (77.2 kg).   PERFORMANCE STATUS (ECOG) : 1 - Symptomatic but completely ambulatory  IMPRESSION: Discussed the use of AI scribe software for clinical note transcription with the patient, who gave verbal consent to proceed.  History of Present Illness Mrs. Arelie Wechter is a 78 year old female with non-small cell lung cancer currently undergoing immunotherapy treatments seen during her infusion for symptom management follow-up. No acute distress. Denies concerns with nausea, vomiting, or diarrhea. Occasional fatigue. She notes an improvement in her appetite, stating 'my appetite came back'. Weight is stable at 170lbs.   Constipation improved with persistent bowel regimen.   She also experiences difficulty sleeping at night, stating 'I ain't sleeping at night. I sleep during the day.' She is concerned about taking medication for sleep due to uncertainty about its effects. No nausea or vomiting.  We discussed her pain at length. Lexee reports pain is slowly improving. Some days are  better than others. She is taking oxycodone  5mg  as needed. States this controls her pain however on some days pain is more intense. She is not requiring daily use of oxycodone . No adjustments to regimen at this time.   No uncontrolled symptoms at this time. Patient doing well overall.   All questions answered and support provided.   Goals of Care 06/26/23: We discussed her current illness and what it means in the larger context of her on-going co-morbidities. Natural disease trajectory and expectations were discussed. Patient and her daughter are realistic in their understanding of patient's overall condition and cancer state.    I empathetically approached discussions regarding healthcare limitations, code status, and advanced directives. Patient does not have advanced directives however reports interest in completing. Extensive education provided on AD  documents. Patient would like to complete at advanced directive clinic today. She expresses wishes to name her daughter, Tramaine as her medical decision maker in the event she is unable to speak for herself, desires a natural death with no CPR or life-sustaining measures in a terminal or comatose state. Open to artificial feedings for trial if condition is reversible.    Ms. Llewellyn is clear in her expressed wishes to continue to treat the treatable allowing her every opportunity to continue thriving while managing her symptoms to minimize discomfort.   We discussed the patient's current illness and what it means in the larger context of their on-going co-morbidities. Natural disease trajectory and expectations were discussed.  I discussed the importance of continued conversation with family and their medical providers regarding overall plan of care and treatment options, ensuring decisions are within the context of the patients values and GOCs.  Assessment & Plan  Cancer Related Pain management Chronic right-sided pain managed effectively with oxycodone . No adjustments to regimen.  - Continue oxycodone  5 mg, 1-2 tablets as needed every 4-6hrs for pain. - Instruct to contact when down to 2-3 days' supply for refill.   Constipation Intermittent constipation likely due to opioid use. Miralax  recommended for its gentle effect. - Instruct to take Miralax  once daily, increase to twice daily if needed. - Consider alternative if Miralax  is ineffective.   Sleep disturbance Difficulty sleeping at night. Temazepam  will be tried first for sleep issues. - Instruct to try temazepam  15 mg at bedtime for sleep as previously prescribed.    Goals of Care Discussed advanced directives and goals of care. She desires a natural death without life-sustaining measures. - Completed advanced directives paperwork at AD clinic. Documents filed. - Daughter is designated as Midwife.   I will plan  to follow-up with patient in 3-4 weeks. Sooner if needed.    Patient expressed understanding and was in agreement with this plan. She also understands that She can call the clinic at any time with any questions, concerns, or complaints.   Any controlled substances utilized were prescribed in the context of palliative care. PDMP has been reviewed.   Visit consisted of counseling and education dealing with the complex and emotionally intense issues of symptom management and palliative care in the setting of serious and potentially life-threatening illness.  Levon Borer, AGPCNP-BC  Palliative Medicine Team/[facility]

## 2023-10-25 ENCOUNTER — Telehealth: Payer: Self-pay

## 2023-10-25 ENCOUNTER — Telehealth: Payer: Self-pay | Admitting: Nurse Practitioner

## 2023-10-25 NOTE — Telephone Encounter (Signed)
 Copied from CRM #[id number]. Topic: Clinical - Prescription Issue >> Oct 25, 2023  2:26 PM [place] J wrote: Reason for CRM: The pharmacist is calling to confirm frequency for use for lidocaine -prilocaine  (EMLA ) cream. Billing is needing a more specified frequency.  Pharmacy: [phone]

## 2023-10-25 NOTE — Telephone Encounter (Signed)
 Scheduled the patient for next two palliative appointments. Called and left a voicemail with the appointment details.

## 2023-10-27 ENCOUNTER — Telehealth: Payer: Self-pay

## 2023-10-27 NOTE — Telephone Encounter (Signed)
Called and let Pharmacy know.

## 2023-10-27 NOTE — Telephone Encounter (Signed)
 LVM patient regarding after-hours message. Patient informed that office had called to inform patient of new appointments. Informed patient of date and time of upcoming appointments. Provided callback number should patient have any additional questions or concerns.

## 2023-11-02 ENCOUNTER — Other Ambulatory Visit: Payer: Self-pay

## 2023-11-02 MED ORDER — SYNTHROID 150 MCG PO TABS: 150.0000 ug | ORAL_TABLET | Freq: Every day | ORAL | 3 refills | Status: DC

## 2023-11-02 NOTE — Telephone Encounter (Signed)
 Refill has been sent.

## 2023-11-02 NOTE — Telephone Encounter (Unsigned)
 Copied from CRM #[id number]. Topic: Clinical - Medication Refill >> Nov 02, 2023  2:54 PM Harlene ORN wrote: Medication: SYNTHROID  150 MCG tablet  Has the patient contacted their pharmacy? Yes (Agent: If no, request that the patient contact the pharmacy for the refill. If patient does not wish to contact the pharmacy document the reason why and proceed with request.) (Agent: If yes, when and what did the pharmacy advise?)  This is the patient's preferred pharmacy:  [id number] - [place], [place] - [address] AT [facility] OF [address] [place] [address] Phone: [phone] Fax: [phone]  Is this the correct pharmacy for this prescription? Yes If no, delete pharmacy and type the correct one.   Has the prescription been filled recently? No  Is the patient out of the medication? Yes  Has the patient been seen for an appointment in the last year OR does the patient have an upcoming appointment? Yes  Can we respond through MyChart? No  Agent: Please be advised that Rx refills may take up to 3 business days. We ask that you follow-up with your pharmacy.

## 2023-11-15 NOTE — Progress Notes (Signed)
 [facility] OFFICE PROGRESS NOTE  Brandi Lynwood ORN, MD [address] [place] [address]  DIAGNOSIS: Stage IIIB (T4, N2b, M0) non-small cell lung cancer, squamous cell carcinoma. She presented with large pleural-based peripheral right upper lobe lung mass measuring up to 8.2 cm in size with large right paratracheal node with low level hypermetabolic uptake.   PDL1: 15    PRIOR THERAPY: concurrent chemoradiation with carboplatin  for an AUC of 2 and taxol  45 mg/m2. First dose expected on 05/08/23. Status post 4 cycles. Last dose was given on 05/30/2023.   CURRENT THERAPY: Consolidation treatment with immunotherapy with Imfinzi  1500 Mg IV every 4 weeks.  First dose 07/03/2023.  Status post 5 cycles.    INTERVAL HISTORY: [name] 78 y.o. female returns to the clinic for a follow up visit accompanied by her daughter. The patient is feeling well today without any concerning complaints. The patient continues to tolerate treatment with immunotherapy with Imfinzi . She is status post 3 cycles and tolerated it well without any adverse effects except fatigue. Of note, she is not very active and also is on oxycodone  for pain management. Denies any fever, chills, night sweats, or weight loss. She states her appetite is coming back. Denies any chest pain, shortness of breath, cough, or hemoptysis. She does not do anything that causes her to be winded. Denies any nausea, vomiting, or diarrhea. She has occasional constipation. Denies any headache or visual changes. Denies any rashes or skin changes. The patient is here today for evaluation prior to starting cycle # 6    MEDICAL HISTORY: Past Medical History:  Diagnosis Date   AAA (abdominal aortic aneurysm)    a. s/p stent graft repair 01/2016.   Acute ischemic colitis 07/29/2010   Diagnosed June, 2012 characterized by acute lower GI bleeding, spontaneously resolved.    Acute respiratory infection 06/09/2014   Acute sinus infection  05/14/2015   Allergic rhinitis, cause unspecified    Angioedema of lips 06/03/2014   Anxiety state, unspecified    Arthritis of left hip 04/16/2015   Bilateral hearing loss 07/19/2017   Chronic LBP    Coronary artery disease    a. inferior MI 1998 s/p PCI of RCA. b. stenting of Cx 04/2010. c. DES to prox LAD 05/2013. d. DES x 3 in 10/2015. // Myoview  07/2018:  EF 59, normal perfusion, low risk    Coronary artery disease involving native heart without angina pectoris 07/31/2006   Qualifier: Diagnosis of  By: Harlow MD, Ozell BRAVO  ANGIOGRAPHIC DATA:  1. Ventriculography done in the RAO projection reveals a small wall  motion abnormality in the mid inferior wall. Ejection fraction  would be estimated at around 50%.  2. The right coronary artery has some progressive disease of about 60-  80% in the proximal mid segment. The distal vessel appears to be  50-70% narrowing althou   Degeneration of lumbar or lumbosacral intervertebral disc    Depressive disorder, not elsewhere classified    Diabetes (HCC) 11/03/2006   Qualifier: Diagnosis of  By: [name] MD, Lynwood [name]    Diabetes mellitus    TYPE II   Frequent urination 05/08/2014   Gout 08/22/2013   History of endovascular stent graft for abdominal aortic aneurysm (AAA) 01/22/2016   History of radiation therapy    Right lung-04/19/23-05/29/23-Dr. [name]   Hyperlipidemia    Hypertension    Hypothyroidism    Iron  deficiency anemia 06/13/2017   Lower GI bleed 06/2010  Diverticular bleed   Lumbar radiculopathy 05/15/2015   MENOPAUSAL DISORDER 10/29/2007   Qualifier: Diagnosis of  By: [name] MD, Lynwood [name]    Myalgia 09/25/2013   Noncompliance with medications 02/26/2014   Obesity, unspecified    OTITIS MEDIA, SEROUS, CHRONIC 04/23/2007   Qualifier: Diagnosis of  By: Harlow MD, Ozell BRAVO    Thoracic disc disease 11/19/2018   URTICARIA 09/23/2009   Qualifier: Diagnosis of  By: Inocencio MD, Berwyn A    Venous insufficiency 07/19/2017     ALLERGIES:  is allergic to crestor  [rosuvastatin  calcium ], prilosec [omeprazole ], miconazole nitrate, augmentin  [amoxicillin -pot clavulanate], and doxycycline .  MEDICATIONS:  Current Outpatient Medications  Medication Sig Dispense Refill   alendronate  (FOSAMAX ) 70 MG tablet Take 1 tablet (70 mg total) by mouth every 7 (seven) days. Take with a full glass of water on an empty stomach. 12 tablet 3   aspirin  EC 81 MG tablet Take 1 tablet (81 mg total) by mouth daily. 100 tablet 3   atenolol  (TENORMIN ) 25 MG tablet Take 0.5 tablets (12.5 mg total) by mouth daily. 45 tablet 3   Blood Pressure Monitoring (OMRON 3 SERIES BP MONITOR) DEVI Use as directed. 1 each 0   Foot Care Products ([employer] GEL HEEL CUSHION WOMENS) PADS Use as directed at bedtime 2 each 5   gabapentin  (NEURONTIN ) 300 MG capsule Take 1-2 tab by mouth at bedtime for pain and sleep 180 capsule 1   lidocaine -prilocaine  (EMLA ) cream Apply 1 Application topically as needed. 30 g 2   oxyCODONE  10 MG TABS Take 1 tablet (10 mg total) by mouth every 4 (four) hours as needed for severe pain (pain score 7-10). 30 tablet 0   polyethylene glycol powder (GLYCOLAX /MIRALAX ) 17 GM/SCOOP powder Take 17 g by mouth 2 (two) times daily as needed. 3350 g 1   prochlorperazine  (COMPAZINE ) 10 MG tablet Take 1 tablet (10 mg total) by mouth every 6 (six) hours as needed. 30 tablet 2   SYNTHROID  150 MCG tablet Take 1 tablet (150 mcg total) by mouth daily before breakfast. 90 tablet 3   temazepam  (RESTORIL ) 15 MG capsule Take 1 capsule (15 mg total) by mouth at bedtime as needed for sleep. 30 capsule 5   Triamcinolone  Acetonide (ZILRETTA ) 32 MG SRER intra-articular injection 5 mL, intra-articular, bilat knees 2 each 0   No current facility-administered medications for this visit.   Facility-Administered Medications Ordered in Other Visits  Medication Dose Route Frequency Provider Last Rate Last Admin   0.9 %  sodium chloride  infusion   Intravenous Continuous  Sherrod Sherrod, MD       durvalumab  (IMFINZI ) 1,500 mg in sodium chloride  0.9 % 100 mL chemo infusion  1,500 mg Intravenous Once Sherrod Sherrod, MD        SURGICAL HISTORY:  Past Surgical History:  Procedure Laterality Date   CARDIAC CATHETERIZATION     PCI OF BOTH THE CIRCUMFLEX AND LEFT ANTERIOR DESCENDING ARTERY   CARDIAC CATHETERIZATION N/A 10/29/2015   Procedure: Coronary Stent Intervention;  Surgeon: Lonni Hanson, MD;  Location: [facility] INVASIVE CV LAB;  Service: Cardiovascular;  Laterality: N/A;   CARDIAC CATHETERIZATION N/A 10/29/2015   Procedure: Coronary/Graft Angiography;  Surgeon: Lonni Hanson, MD;  Location: [facility] INVASIVE CV LAB;  Service: Cardiovascular;  Laterality: N/A;   CARDIAC CATHETERIZATION N/A 10/29/2015   Procedure: Intravascular Pressure Wire/FFR Study;  Surgeon: Lonni Hanson, MD;  Location: [facility] INVASIVE CV LAB;  Service: Cardiovascular;  Laterality: N/A;   CESAREAN SECTION     ENDOVASCULAR STENT INSERTION  N/A 01/22/2016   Procedure: ABDOMINAL AORTIC ENDOVASCULAR STENT GRAFT INSERTION;  Surgeon: Krystal JULIANNA Doing, MD;  Location: [facility] OR;  Service: Vascular;  Laterality: N/A;   HEART STENT  04-2010  and  Jun 07, 2013   X 3   IR EMBO ARTERIAL NOT HEMORR HEMANG INC GUIDE ROADMAPPING  07/17/2023   IR IMAGING GUIDED PORT INSERTION  05/17/2023   IR RADIOLOGIST EVAL & MGMT  06/21/2023   LEFT HEART CATH AND CORONARY ANGIOGRAPHY N/A 04/03/2019   Procedure: LEFT HEART CATH AND CORONARY ANGIOGRAPHY;  Surgeon: Mady Bruckner, MD;  Location: [facility] INVASIVE CV LAB;  Service: Cardiovascular;  Laterality: N/A;   LEFT HEART CATHETERIZATION WITH CORONARY ANGIOGRAM N/A 06/07/2013   Procedure: LEFT HEART CATHETERIZATION WITH CORONARY ANGIOGRAM;  Surgeon: Bruckner JONETTA Cash, MD;  Location: [facility] CATH LAB;  Service: Cardiovascular;  Laterality: N/A;   LEFT HEART CATHETERIZATION WITH CORONARY ANGIOGRAM N/A 02/25/2014   Procedure: LEFT HEART CATHETERIZATION WITH CORONARY ANGIOGRAM;  Surgeon: Debby DELENA Sor, MD;  Location: [facility] CATH LAB;  Service: Cardiovascular;  Laterality: N/A;   LUMBAR FUSION  01/2007   DR. GUST...3-LEVEL WITH FIXATION   OOPHORECTOMY     BSO? pt.unsure   PARATHYROIDECTOMY     SPINE SURGERY     THYROIDECTOMY     TOTAL ABDOMINAL HYSTERECTOMY      REVIEW OF SYSTEMS:   Review of Systems  Constitutional: Positive for fatigue. Negative for appetite change, chills, fever and unexpected weight change.  HENT: Negative for mouth sores, nosebleeds, sore throat and trouble swallowing.   Eyes: Negative for eye problems and icterus.  Respiratory: Negative for cough, hemoptysis, shortness of breath and wheezing.   Cardiovascular: Negative for chest pain and leg swelling.  Gastrointestinal: Negative for abdominal pain, constipation, diarrhea, nausea and vomiting.  Genitourinary: Negative for bladder incontinence, difficulty urinating, dysuria, frequency and hematuria.   Musculoskeletal: Negative for back pain, gait problem, neck pain and neck stiffness.  Skin: Negative for itching and rash.  Neurological: Negative for dizziness, extremity weakness, gait problem, headaches, light-headedness and seizures.  Hematological: Negative for adenopathy. Does not bruise/bleed easily.  Psychiatric/Behavioral: Negative for confusion, depression and sleep disturbance. The patient is not nervous/anxious.     PHYSICAL EXAMINATION:  There were no vitals taken for this visit.  ECOG PERFORMANCE STATUS: 1  Physical Exam  Constitutional: Oriented to person, place, and time and well-developed, well-nourished, and in no distress.  HENT:  Head: Normocephalic and atraumatic.  Mouth/Throat: Oropharynx is clear and moist. No oropharyngeal exudate.  Eyes: Conjunctivae are normal. Right eye exhibits no discharge. Left eye exhibits no discharge. No scleral icterus.  Neck: Normal range of motion. Neck supple.  Cardiovascular: Normal rate, regular rhythm, normal heart sounds and intact distal pulses.    Pulmonary/Chest: Effort normal and breath sounds normal. No respiratory distress. No wheezes. No rales.  Abdominal: Soft. Bowel sounds are normal. Exhibits no distension and no mass. There is no tenderness.  Musculoskeletal: Normal range of motion. Exhibits no edema.  Lymphadenopathy:    No cervical adenopathy.  Neurological: Alert and oriented to person, place, and time. Exhibits normal muscle tone. Examined in the wheelchair.   Skin: Skin is warm and dry. No rash noted. Not diaphoretic. No erythema. No pallor.  Psychiatric: Mood, memory and judgment normal.  Vitals reviewed.  LABORATORY DATA: Lab Results  Component Value Date   WBC 4.6 11/20/2023   HGB 12.7 11/20/2023   HCT 38.7 11/20/2023   MCV 89.8 11/20/2023   PLT 190 11/20/2023  Chemistry      Component Value Date/Time   NA 143 11/20/2023 1052   NA 141 06/08/2021 1606   K 3.7 11/20/2023 1052   CL 107 11/20/2023 1052   CO2 30 11/20/2023 1052   BUN 5 (L) 11/20/2023 1052   BUN 4 (L) 06/08/2021 1606   CREATININE 0.64 11/20/2023 1052      Component Value Date/Time   CALCIUM  8.0 (L) 11/20/2023 1052   ALKPHOS 102 11/20/2023 1052   AST 12 (L) 11/20/2023 1052   ALT 7 11/20/2023 1052   BILITOT 0.5 11/20/2023 1052       RADIOGRAPHIC STUDIES:  No results found.    ASSESSMENT/PLAN:  This is a very pleasant 78 year old African-American female with Stage IIIB (T4, N2b, M0) non-small cell lung cancer, squamous cell carcinoma. She presented with large pleural-based peripheral right upper lobe lung mass measuring up to 8.2 cm in size with large right paratracheal node with low level hypermetabolic uptake.   She underwent a course of concurrent chemoradiation with weekly carboplatin  for AUC of 2 and paclitaxel  45 Mg/M2.  Status post 4 cycles.   The patient is currently undergoing consolidation immunotherapy with Imfinzi  1500 mg IV every 4 weeks. She is status post 5 cycles.    Labs were reviewed. As long as that is  within parameters. Recommend she proceed with cycle #6 today as scheduled.    We will see her in 4 weeks before undergoing cycle #7.   Constipation Constipation possibly related to pain medication. Discussed hydration, diet, and physical activity. - Ensure adequate hydration. - Encouraged intake of fruits and vegetables. - Advised on physical activity to aid bowel movements. - OTC medications as needed for constipation  Cancer-related fatigue Fatigue likely multifactorial from treatment and deconditioning. Discussed benefits of physical activity. - Encouraged use of foot pedals for circulation while seated. - Advised on safe physical activities to improve exercise tolerance - Emphasized importance of good nutrition.   Hypothyroidism under treatment Hypothyroidism managed with medication. Recent supply delay resolved with 90-day supply and refills. - Ensure timely refills of thyroid  medication. - Monitor thyroid  function regularly.  The patient was advised to call immediately if she has any concerning symptoms in the interval. The patient voices understanding of current disease status and treatment options and is in agreement with the current care plan. All questions were answered. The patient knows to call the clinic with any problems, questions or concerns. We can certainly see the patient much sooner if necessary    No orders of the defined types were placed in this encounter.    The total time spent in the appointment was 20-29 minutes  Brandi Degraffenreid L Laithan Conchas, PA-C 11/20/23

## 2023-11-20 ENCOUNTER — Inpatient Hospital Stay: Attending: Internal Medicine

## 2023-11-20 ENCOUNTER — Encounter: Payer: Self-pay | Admitting: Nurse Practitioner

## 2023-11-20 ENCOUNTER — Inpatient Hospital Stay

## 2023-11-20 ENCOUNTER — Inpatient Hospital Stay: Admitting: Nurse Practitioner

## 2023-11-20 ENCOUNTER — Inpatient Hospital Stay: Admitting: Physician Assistant

## 2023-11-20 VITALS — BP 101/65 | HR 80 | Temp 97.7°F | Resp 16 | Ht 66.0 in | Wt 176.0 lb

## 2023-11-20 DIAGNOSIS — Z5112 Encounter for antineoplastic immunotherapy: Secondary | ICD-10-CM | POA: Insufficient documentation

## 2023-11-20 DIAGNOSIS — K5900 Constipation, unspecified: Secondary | ICD-10-CM | POA: Insufficient documentation

## 2023-11-20 DIAGNOSIS — C3411 Malignant neoplasm of upper lobe, right bronchus or lung: Secondary | ICD-10-CM | POA: Diagnosis not present

## 2023-11-20 DIAGNOSIS — E039 Hypothyroidism, unspecified: Secondary | ICD-10-CM | POA: Insufficient documentation

## 2023-11-20 DIAGNOSIS — Z515 Encounter for palliative care: Secondary | ICD-10-CM | POA: Insufficient documentation

## 2023-11-20 DIAGNOSIS — G893 Neoplasm related pain (acute) (chronic): Secondary | ICD-10-CM

## 2023-11-20 DIAGNOSIS — K5903 Drug induced constipation: Secondary | ICD-10-CM

## 2023-11-20 DIAGNOSIS — R530 Neoplastic (malignant) related fatigue: Secondary | ICD-10-CM | POA: Insufficient documentation

## 2023-11-20 LAB — CBC WITH DIFFERENTIAL (CANCER CENTER ONLY)
Abs Immature Granulocytes: 0.03 K/uL (ref 0.00–0.07)
Basophils Absolute: 0 K/uL (ref 0.0–0.1)
Basophils Relative: 0 %
Eosinophils Absolute: 0.1 K/uL (ref 0.0–0.5)
Eosinophils Relative: 2 %
HCT: 38.7 % (ref 36.0–46.0)
Hemoglobin: 12.7 g/dL (ref 12.0–15.0)
Immature Granulocytes: 1 %
Lymphocytes Relative: 22 %
Lymphs Abs: 1 K/uL (ref 0.7–4.0)
MCH: 29.5 pg (ref 26.0–34.0)
MCHC: 32.8 g/dL (ref 30.0–36.0)
MCV: 89.8 fL (ref 80.0–100.0)
Monocytes Absolute: 0.5 K/uL (ref 0.1–1.0)
Monocytes Relative: 10 %
Neutro Abs: 3 K/uL (ref 1.7–7.7)
Neutrophils Relative %: 65 %
Platelet Count: 190 K/uL (ref 150–400)
RBC: 4.31 MIL/uL (ref 3.87–5.11)
RDW: 14 % (ref 11.5–15.5)
WBC Count: 4.6 K/uL (ref 4.0–10.5)
nRBC: 0 % (ref 0.0–0.2)

## 2023-11-20 LAB — CMP (CANCER CENTER ONLY)
ALT: 7 U/L (ref 0–44)
AST: 12 U/L — ABNORMAL LOW (ref 15–41)
Albumin: 3 g/dL — ABNORMAL LOW (ref 3.5–5.0)
Alkaline Phosphatase: 102 U/L (ref 38–126)
Anion gap: 6 (ref 5–15)
BUN: 5 mg/dL — ABNORMAL LOW (ref 8–23)
CO2: 30 mmol/L (ref 22–32)
Calcium: 8 mg/dL — ABNORMAL LOW (ref 8.9–10.3)
Chloride: 107 mmol/L (ref 98–111)
Creatinine: 0.64 mg/dL (ref 0.44–1.00)
GFR, Estimated: >60 mL/min (ref >60)
Glucose, Bld: 90 mg/dL (ref 70–99)
Potassium: 3.7 mmol/L (ref 3.5–5.1)
Sodium: 143 mmol/L (ref 135–145)
Total Bilirubin: 0.5 mg/dL (ref 0.0–1.2)
Total Protein: 6.5 g/dL (ref 6.5–8.1)

## 2023-11-20 LAB — TSH: TSH: 22.7 u[IU]/mL — ABNORMAL HIGH (ref 0.350–4.500)

## 2023-11-20 MED ORDER — OXYCODONE HCL 10 MG PO TABS: 10.0000 mg | ORAL_TABLET | ORAL | 0 refills | Status: DC | PRN

## 2023-11-20 MED ORDER — SODIUM CHLORIDE 0.9 % IV SOLN: INTRAVENOUS | Status: DC

## 2023-11-20 MED ORDER — SODIUM CHLORIDE 0.9 % IV SOLN
1500.0000 mg | Freq: Once | INTRAVENOUS | Status: AC
  Administered 2023-11-20: 1500 mg via INTRAVENOUS
  Filled 2023-11-20: qty 30

## 2023-11-20 NOTE — Progress Notes (Signed)
 Palliative Medicine [facility]  [phone] [phone]   Name: [name] Date: 11/20/2023 MRN: [id number]  DOB: 06-03-1945  Patient Care Team: [name], MD as PCP - Diedre Wonda Sharper, MD as PCP - Cardiology (Cardiology) Tobie Tonita POUR, DO as Consulting Physician (Neurology) Lelon Glendia ONEIDA DEVONNA as Physician Assistant (Cardiology) Joane Artist RAMAN, MD as Consulting Physician (Family Medicine) Prentis Duwaine BROCKS, RN as Oncology Nurse Navigator Pickenpack-Cousar, Fannie SAILOR, NP as Nurse Practitioner [facility] and Palliative Medicine)   INTERVAL HISTORY: [name] is a 78 y.o. female with  oncologic medical history including stage IIIB non-small cell lung cancer (03/2023) s/p concurrent chemoradiation with plans for immunotherapy starting 07/03/23.  Palliative is seeing patient for symptom management and goals of care.   SOCIAL HISTORY:     reports that she quit smoking about 37 years ago. Her smoking use included cigarettes. She started smoking about 67 years ago. She has never used smokeless tobacco. She reports that she does not drink alcohol and does not use drugs.  ADVANCE DIRECTIVES:  Completed on 06/26/23. Daughter [name] is designated HCPOA.   CODE STATUS: DNR  PAST MEDICAL HISTORY: Past Medical History:  Diagnosis Date   AAA (abdominal aortic aneurysm)    a. s/p stent graft repair 01/2016.   Acute ischemic colitis 07/29/2010   Diagnosed June, 2012 characterized by acute lower GI bleeding, spontaneously resolved.    Acute respiratory infection 06/09/2014   Acute sinus infection 05/14/2015   Allergic rhinitis, cause unspecified    Angioedema of lips 06/03/2014   Anxiety state, unspecified    Arthritis of left hip 04/16/2015   Bilateral hearing loss 07/19/2017   Chronic LBP    Coronary artery disease    a. inferior MI 1998 s/p PCI of RCA. b. stenting of Cx 04/2010. c. DES to prox LAD 05/2013. d. DES x 3 in  10/2015. // Myoview  07/2018:  EF 59, normal perfusion, low risk    Coronary artery disease involving native heart without angina pectoris 07/31/2006   Qualifier: Diagnosis of  By: Harlow MD, Sharper BRAVO  ANGIOGRAPHIC DATA:  1. Ventriculography done in the RAO projection reveals a small wall  motion abnormality in the mid inferior wall. Ejection fraction  would be estimated at around 50%.  2. The right coronary artery has some progressive disease of about 60-  80% in the proximal mid segment. The distal vessel appears to be  50-70% narrowing althou   Degeneration of lumbar or lumbosacral intervertebral disc    Depressive disorder, not elsewhere classified    Diabetes (HCC) 11/03/2006   Qualifier: Diagnosis of  By: [name] MD, Lynwood ORN    Diabetes mellitus    TYPE II   Frequent urination 05/08/2014   Gout 08/22/2013   History of endovascular stent graft for abdominal aortic aneurysm (AAA) 01/22/2016   History of radiation therapy    Right lung-04/19/23-05/29/23-Dr. [name]   Hyperlipidemia    Hypertension    Hypothyroidism    Iron  deficiency anemia 06/13/2017   Lower GI bleed 06/2010   Diverticular bleed   Lumbar radiculopathy 05/15/2015   MENOPAUSAL DISORDER 10/29/2007   Qualifier: Diagnosis of  By: [name] MD, Lynwood ORN    Myalgia 09/25/2013   Noncompliance with medications 02/26/2014   Obesity, unspecified    OTITIS MEDIA, SEROUS, CHRONIC 04/23/2007   Qualifier: Diagnosis of  By: Harlow MD, Sharper BRAVO    Thoracic disc disease 11/19/2018   URTICARIA 09/23/2009  Qualifier: Diagnosis of  By: Inocencio MD, Berwyn A    Venous insufficiency 07/19/2017    ALLERGIES:  is allergic to crestor  [rosuvastatin  calcium ], prilosec [omeprazole ], miconazole nitrate, augmentin  [amoxicillin -pot clavulanate], and doxycycline .  MEDICATIONS:  Current Outpatient Medications  Medication Sig Dispense Refill   alendronate  (FOSAMAX ) 70 MG tablet Take 1 tablet (70 mg total) by mouth every 7 (seven) days. Take  with a full glass of water on an empty stomach. 12 tablet 3   aspirin  EC 81 MG tablet Take 1 tablet (81 mg total) by mouth daily. 100 tablet 3   atenolol  (TENORMIN ) 25 MG tablet Take 0.5 tablets (12.5 mg total) by mouth daily. 45 tablet 3   Blood Pressure Monitoring (OMRON 3 SERIES BP MONITOR) DEVI Use as directed. 1 each 0   Foot Care Products ([employer] GEL HEEL CUSHION WOMENS) PADS Use as directed at bedtime 2 each 5   gabapentin  (NEURONTIN ) 300 MG capsule Take 1-2 tab by mouth at bedtime for pain and sleep 180 capsule 1   lidocaine -prilocaine  (EMLA ) cream Apply 1 Application topically as needed. 30 g 2   oxyCODONE  10 MG TABS Take 1 tablet (10 mg total) by mouth every 4 (four) hours as needed for severe pain (pain score 7-10). 30 tablet 0   polyethylene glycol powder (GLYCOLAX /MIRALAX ) 17 GM/SCOOP powder Take 17 g by mouth 2 (two) times daily as needed. 3350 g 1   prochlorperazine  (COMPAZINE ) 10 MG tablet Take 1 tablet (10 mg total) by mouth every 6 (six) hours as needed. 30 tablet 2   SYNTHROID  150 MCG tablet Take 1 tablet (150 mcg total) by mouth daily before breakfast. 90 tablet 3   temazepam  (RESTORIL ) 15 MG capsule Take 1 capsule (15 mg total) by mouth at bedtime as needed for sleep. 30 capsule 5   Triamcinolone  Acetonide (ZILRETTA ) 32 MG SRER intra-articular injection 5 mL, intra-articular, bilat knees 2 each 0   No current facility-administered medications for this visit.    VITAL SIGNS: BP 101/65 (BP Location: Left Arm, Patient Position: Sitting)   Pulse 80   Temp 97.7 F (36.5 C) (Temporal)   Resp 16   Ht 5' 6 (1.676 m)   Wt 176 lb (79.8 kg)   SpO2 100%   BMI 28.41 kg/m  Filed Weights   11/20/23 1009  Weight: 176 lb (79.8 kg)    Estimated body mass index is 28.41 kg/m as calculated from the following:   Height as of this encounter: 5' 6 (1.676 m).   Weight as of this encounter: 176 lb (79.8 kg).   PERFORMANCE STATUS (ECOG) : 1 - Symptomatic but completely  ambulatory  IMPRESSION: Discussed the use of AI scribe software for clinical note transcription with the patient, who gave verbal consent to proceed.  History of Present Illness Mrs. Santiana Mcleroy is a 78 year old female with non-small cell lung cancer currently undergoing immunotherapy treatments who presents to clinic for symptom management follow-up. No acute distress. Denies concerns with nausea, vomiting, or diarrhea. Occasional fatigue. She is accompanied by her daughter.   Appetite is improving. Weight is up to 176lbs. She has been experiencing constipation despite taking Miralax  once daily, with no significant improvement. She had a small bowel movement this week, which she does not consider substantial, and it has been over a week since her last significant bowel movement. Education provided on the importance of bowel regimen. I have recommended patient increase Miralax  to twice daily, if no improvement over the next 48-72 hours she  will include Senna-S 2 tablets at bedtime. To provide immediate relief patient instructed to take magnesium  citrate. She and daughter verbalized understanding.   We discussed her pain at length. Joylyn reports pain is slowly improving. Some days are better than others. She is taking oxycodone  5-10 mg as needed. Finds she is has to take 2 tablets (10mg ) for more effective relief. States this controls her pain however on some days pain is more intense. She is not requiring daily use of oxycodone .   All questions answered and support provided.   Goals of Care 06/26/23: We discussed her current illness and what it means in the larger context of her on-going co-morbidities. Natural disease trajectory and expectations were discussed. Patient and her daughter are realistic in their understanding of patient's overall condition and cancer state.    I empathetically approached discussions regarding healthcare limitations, code status, and advanced directives. Patient does  not have advanced directives however reports interest in completing. Extensive education provided on AD documents. Patient would like to complete at advanced directive clinic today. She expresses wishes to name her daughter, Tramaine as her medical decision maker in the event she is unable to speak for herself, desires a natural death with no CPR or life-sustaining measures in a terminal or comatose state. Open to artificial feedings for trial if condition is reversible.    Ms. Mirando is clear in her expressed wishes to continue to treat the treatable allowing her every opportunity to continue thriving while managing her symptoms to minimize discomfort.   We discussed the patient's current illness and what it means in the larger context of their on-going co-morbidities. Natural disease trajectory and expectations were discussed.  I discussed the importance of continued conversation with family and their medical providers regarding overall plan of care and treatment options, ensuring decisions are within the context of the patients values and GOCs.  Assessment & Plan  Cancer Related Pain management Chronic right-sided pain managed effectively with oxycodone .  - Continue oxycodone  10 mg, 1-2 tablets as needed every 4-6hrs for pain. Will send refill in for 10mg  to prevent patient from having to take 2 tablets.  - Instruct to contact when down to 2-3 days' supply for refill.   Constipation Intermittent constipation likely due to opioid use. Miralax  recommended for its gentle effect. - Instruct to increase Miralax  to twice daily if needed. - Start Senna-S 2 tablets if no relief or improvement over the next 2-3 days.  -Magnesium  Citrate half bottle. If no bowel movement by tomorrow take the other half.    Sleep disturbance Difficulty sleeping at night. Temazepam  will be tried first for sleep issues. - Instruct to try temazepam  15 mg at bedtime for sleep as previously prescribed.    Goals of  Care Discussed advanced directives and goals of care. She desires a natural death without life-sustaining measures. - Completed advanced directives paperwork at AD clinic. Documents filed. - Daughter is designated as midwife.   I will plan to follow-up with patient in 3-4 weeks. Sooner if needed.    Patient expressed understanding and was in agreement with this plan. She also understands that She can call the clinic at any time with any questions, concerns, or complaints.   Any controlled substances utilized were prescribed in the context of palliative care. PDMP has been reviewed.   Visit consisted of counseling and education dealing with the complex and emotionally intense issues of symptom management and palliative care in the setting of serious and  potentially life-threatening illness.  Levon Borer, AGPCNP-BC  Palliative Medicine Team/[facility]

## 2023-11-20 NOTE — Patient Instructions (Signed)
 [facility] - A DEPT OF [facility]  Discharge Instructions: Thank you for choosing [facility] to provide your oncology and hematology care.   If you have a lab appointment with the Cancer Center, please go directly to the Cancer Center and check in at the registration area.   Wear comfortable clothing and clothing appropriate for easy access to any Portacath or PICC line.   We strive to give you quality time with your provider. You may need to reschedule your appointment if you arrive late (15 or more minutes).  Arriving late affects you and other patients whose appointments are after yours.  Also, if you miss three or more appointments without notifying the office, you may be dismissed from the clinic at the provider's discretion.      For prescription refill requests, have your pharmacy contact our office and allow 72 hours for refills to be completed.    Today you received the following chemotherapy and/or immunotherapy agents: durvalumab (IMFINZI)       To help prevent nausea and vomiting after your treatment, we encourage you to take your nausea medication as directed.  BELOW ARE SYMPTOMS THAT SHOULD BE REPORTED IMMEDIATELY: *FEVER GREATER THAN 100.4 F (38 C) OR HIGHER *CHILLS OR SWEATING *NAUSEA AND VOMITING THAT IS NOT CONTROLLED WITH YOUR NAUSEA MEDICATION *UNUSUAL SHORTNESS OF BREATH *UNUSUAL BRUISING OR BLEEDING *URINARY PROBLEMS (pain or burning when urinating, or frequent urination) *BOWEL PROBLEMS (unusual diarrhea, constipation, pain near the anus) TENDERNESS IN MOUTH AND THROAT WITH OR WITHOUT PRESENCE OF ULCERS (sore throat, sores in mouth, or a toothache) UNUSUAL RASH, SWELLING OR PAIN  UNUSUAL VAGINAL DISCHARGE OR ITCHING   Items with * indicate a potential emergency and should be followed up as soon as possible or go to the Emergency Department if any problems should occur.  Please show the CHEMOTHERAPY ALERT CARD or  IMMUNOTHERAPY ALERT CARD at check-in to the Emergency Department and triage nurse.  Should you have questions after your visit or need to cancel or reschedule your appointment, please contact [facility] - A DEPT OF [facility]  Dept: [phone]  and follow the prompts.  Office hours are 8:00 a.m. to 4:30 p.m. Monday - Friday. Please note that voicemails left after 4:00 p.m. may not be returned until the following business day.  We are closed weekends and major holidays. You have access to a nurse at all times for urgent questions. Please call the main number to the clinic Dept: [phone] and follow the prompts.   For any non-urgent questions, you may also contact your provider using MyChart. We now offer e-Visits for anyone 39 and older to request care online for non-urgent symptoms. For details visit [email or web address].   Also download the MyChart app! Go to the app store, search "MyChart", open the app, select [place], and log in with your MyChart username and password.

## 2023-11-21 ENCOUNTER — Encounter: Payer: Self-pay | Admitting: Internal Medicine

## 2023-11-21 ENCOUNTER — Ambulatory Visit (INDEPENDENT_AMBULATORY_CARE_PROVIDER_SITE_OTHER): Admitting: Internal Medicine

## 2023-11-21 VITALS — BP 120/78 | HR 76 | Temp 98.5°F | Ht 66.0 in | Wt 175.0 lb

## 2023-11-21 DIAGNOSIS — Z23 Encounter for immunization: Secondary | ICD-10-CM

## 2023-11-21 DIAGNOSIS — J309 Allergic rhinitis, unspecified: Secondary | ICD-10-CM | POA: Diagnosis not present

## 2023-11-21 DIAGNOSIS — E119 Type 2 diabetes mellitus without complications: Secondary | ICD-10-CM

## 2023-11-21 DIAGNOSIS — H6123 Impacted cerumen, bilateral: Secondary | ICD-10-CM | POA: Insufficient documentation

## 2023-11-21 DIAGNOSIS — E559 Vitamin D deficiency, unspecified: Secondary | ICD-10-CM | POA: Diagnosis not present

## 2023-11-21 DIAGNOSIS — M170 Bilateral primary osteoarthritis of knee: Secondary | ICD-10-CM | POA: Insufficient documentation

## 2023-11-21 DIAGNOSIS — E1165 Type 2 diabetes mellitus with hyperglycemia: Secondary | ICD-10-CM | POA: Diagnosis not present

## 2023-11-21 DIAGNOSIS — H6691 Otitis media, unspecified, right ear: Secondary | ICD-10-CM

## 2023-11-21 LAB — BASIC METABOLIC PANEL WITH GFR
BUN: 6 mg/dL (ref 6–23)
CO2: 29 meq/L (ref 19–32)
Calcium: 8.1 mg/dL — ABNORMAL LOW (ref 8.4–10.5)
Chloride: 104 meq/L (ref 96–112)
Creatinine, Ser: 0.69 mg/dL (ref 0.40–1.20)
GFR: 83.22 mL/min (ref >60.00)
Glucose, Bld: 120 mg/dL — ABNORMAL HIGH (ref 70–99)
Potassium: 4 meq/L (ref 3.5–5.1)
Sodium: 141 meq/L (ref 135–145)

## 2023-11-21 LAB — LIPID PANEL
Cholesterol: 161 mg/dL (ref 0–200)
HDL: 34.9 mg/dL — ABNORMAL LOW (ref >39.00)
LDL Cholesterol: 106 mg/dL — ABNORMAL HIGH (ref 0–99)
NonHDL: 125.66
Total CHOL/HDL Ratio: 5
Triglycerides: 97 mg/dL (ref 0.0–149.0)
VLDL: 19.4 mg/dL (ref 0.0–40.0)

## 2023-11-21 LAB — HEPATIC FUNCTION PANEL
ALT: 8 U/L (ref 0–35)
AST: 13 U/L (ref 0–37)
Albumin: 3.3 g/dL — ABNORMAL LOW (ref 3.5–5.2)
Alkaline Phosphatase: 106 U/L (ref 39–117)
Bilirubin, Direct: 0 mg/dL (ref 0.0–0.3)
Total Bilirubin: 0.4 mg/dL (ref 0.2–1.2)
Total Protein: 7.1 g/dL (ref 6.0–8.3)

## 2023-11-21 LAB — HEMOGLOBIN A1C: Hgb A1c MFr Bld: 6 % (ref 4.6–6.5)

## 2023-11-21 LAB — T4: T4, Total: 5.8 ug/dL (ref 4.5–12.0)

## 2023-11-21 MED ORDER — AZITHROMYCIN 250 MG PO TABS: ORAL_TABLET | ORAL | 1 refills | Status: AC

## 2023-11-21 MED ORDER — PREDNISONE 10 MG PO TABS: ORAL_TABLET | ORAL | 0 refills | Status: DC

## 2023-11-21 NOTE — Progress Notes (Signed)
 Patient ID: [name], female   DOB: [birth year], 78 y.o.   MRN: [id number]        Chief Complaint: follow up right ear pain, bilateral cerumen impactions, allergies, dm, bilateral knee djd       HPI:  [name] is a 78 y.o. female here with c/o 3 days onset right ear pain, pressure and feeling warm, without sinus pain but Does have several wks ongoing nasal allergy symptoms with clearish congestion, itch and sneezing, without fever, pain, ST, cough, swelling or wheezing. Also has worsening bilateral cerumen impactions need resolved.  Pt denies chest pain, increased sob or doe,  orthopnea, PND, increased LE swelling, palpitations, dizziness or syncope.   Pt denies polydipsia, polyuria, or new focal neuro s/s.   Does also have worsening bilateral near end stage knee djd, better with cortisone in past but most recently not helping.  Due for flu shot Has been out of levothyroxiine for 2 wks but restarting now.  Has appt with optho for next wk.  Wt Readings from Last 3 Encounters:  11/21/23 175 lb (79.4 kg)  11/20/23 176 lb (79.8 kg)  10/24/23 170 lb 1.6 oz (77.2 kg)   BP Readings from Last 3 Encounters:  11/21/23 120/78  11/20/23 101/65  10/24/23 134/86         Past Medical History:  Diagnosis Date   AAA (abdominal aortic aneurysm)    a. s/p stent graft repair 01/2016.   Acute ischemic colitis 07/29/2010   Diagnosed June, 2012 characterized by acute lower GI bleeding, spontaneously resolved.    Acute respiratory infection 06/09/2014   Acute sinus infection 05/14/2015   Allergic rhinitis, cause unspecified    Angioedema of lips 06/03/2014   Anxiety state, unspecified    Arthritis of left hip 04/16/2015   Bilateral hearing loss 07/19/2017   Chronic LBP    Coronary artery disease    a. inferior MI 1998 s/p PCI of RCA. b. stenting of Cx 04/2010. c. DES to prox LAD 05/2013. d. DES x 3 in 10/2015. // Myoview  07/2018:  EF 59, normal perfusion, low risk    Coronary artery disease involving  native heart without angina pectoris 07/31/2006   Qualifier: Diagnosis of  By: Harlow MD, Ozell BRAVO  ANGIOGRAPHIC DATA:  1. Ventriculography done in the RAO projection reveals a small wall  motion abnormality in the mid inferior wall. Ejection fraction  would be estimated at around 50%.  2. The right coronary artery has some progressive disease of about 60-  80% in the proximal mid segment. The distal vessel appears to be  50-70% narrowing althou   Degeneration of lumbar or lumbosacral intervertebral disc    Depressive disorder, not elsewhere classified    Diabetes (HCC) 11/03/2006   Qualifier: Diagnosis of  By: [name] MD, Lynwood ORN    Diabetes mellitus    TYPE II   Frequent urination 05/08/2014   Gout 08/22/2013   History of endovascular stent graft for abdominal aortic aneurysm (AAA) 01/22/2016   History of radiation therapy    Right lung-04/19/23-05/29/23-Dr. [name]   Hyperlipidemia    Hypertension    Hypothyroidism    Iron  deficiency anemia 06/13/2017   Lower GI bleed 06/2010   Diverticular bleed   Lumbar radiculopathy 05/15/2015   MENOPAUSAL DISORDER 10/29/2007   Qualifier: Diagnosis of  By: [name] MD, Lynwood ORN    Myalgia 09/25/2013   Noncompliance with medications 02/26/2014   Obesity, unspecified    OTITIS MEDIA, SEROUS, CHRONIC 04/23/2007  Qualifier: Diagnosis of  By: Harlow MD, Ozell BRAVO    Thoracic disc disease 11/19/2018   URTICARIA 09/23/2009   Qualifier: Diagnosis of  By: Inocencio MD, Berwyn A    Venous insufficiency 07/19/2017   Past Surgical History:  Procedure Laterality Date   CARDIAC CATHETERIZATION     PCI OF BOTH THE CIRCUMFLEX AND LEFT ANTERIOR DESCENDING ARTERY   CARDIAC CATHETERIZATION N/A 10/29/2015   Procedure: Coronary Stent Intervention;  Surgeon: Lonni Hanson, MD;  Location: [facility] INVASIVE CV LAB;  Service: Cardiovascular;  Laterality: N/A;   CARDIAC CATHETERIZATION N/A 10/29/2015   Procedure: Coronary/Graft Angiography;  Surgeon: Lonni Hanson,  MD;  Location: [facility] INVASIVE CV LAB;  Service: Cardiovascular;  Laterality: N/A;   CARDIAC CATHETERIZATION N/A 10/29/2015   Procedure: Intravascular Pressure Wire/FFR Study;  Surgeon: Lonni Hanson, MD;  Location: [facility] INVASIVE CV LAB;  Service: Cardiovascular;  Laterality: N/A;   CESAREAN SECTION     ENDOVASCULAR STENT INSERTION N/A 01/22/2016   Procedure: ABDOMINAL AORTIC ENDOVASCULAR STENT GRAFT INSERTION;  Surgeon: Krystal JULIANNA Doing, MD;  Location: [facility] OR;  Service: Vascular;  Laterality: N/A;   HEART STENT  04-2010  and  Jun 07, 2013   X 3   IR EMBO ARTERIAL NOT HEMORR HEMANG INC GUIDE ROADMAPPING  07/17/2023   IR IMAGING GUIDED PORT INSERTION  05/17/2023   IR RADIOLOGIST EVAL & MGMT  06/21/2023   LEFT HEART CATH AND CORONARY ANGIOGRAPHY N/A 04/03/2019   Procedure: LEFT HEART CATH AND CORONARY ANGIOGRAPHY;  Surgeon: Hanson Lonni, MD;  Location: [facility] INVASIVE CV LAB;  Service: Cardiovascular;  Laterality: N/A;   LEFT HEART CATHETERIZATION WITH CORONARY ANGIOGRAM N/A 06/07/2013   Procedure: LEFT HEART CATHETERIZATION WITH CORONARY ANGIOGRAM;  Surgeon: Lonni JONETTA Cash, MD;  Location: [facility] CATH LAB;  Service: Cardiovascular;  Laterality: N/A;   LEFT HEART CATHETERIZATION WITH CORONARY ANGIOGRAM N/A 02/25/2014   Procedure: LEFT HEART CATHETERIZATION WITH CORONARY ANGIOGRAM;  Surgeon: Debby DELENA Sor, MD;  Location: [facility] CATH LAB;  Service: Cardiovascular;  Laterality: N/A;   LUMBAR FUSION  01/2007   DR. GUST...3-LEVEL WITH FIXATION   OOPHORECTOMY     BSO? pt.unsure   PARATHYROIDECTOMY     SPINE SURGERY     THYROIDECTOMY     TOTAL ABDOMINAL HYSTERECTOMY      reports that she quit smoking about 37 years ago. Her smoking use included cigarettes. She started smoking about 67 years ago. She has never used smokeless tobacco. She reports that she does not drink alcohol and does not use drugs. family history includes Arthritis in her maternal aunt; Breast cancer in her maternal aunt; Diabetes in her brother and  maternal aunt; Heart attack (age of onset: 16) in her mother; Heart attack (age of onset: 93) in her father; Heart disease in her father and mother. Allergies  Allergen Reactions   Crestor  [Rosuvastatin  Calcium ] Other (See Comments)    Feeling poor   Prilosec [Omeprazole ] Other (See Comments)    Chest pain   Miconazole Nitrate Hives    REACTION: hives   Augmentin  [Amoxicillin -Pot Clavulanate] Hives, Itching and Rash    Has patient had a PCN reaction causing immediate rash, facial/tongue/throat swelling, SOB or lightheadedness with hypotension:unsure Has patient had a PCN reaction causing severe rash involving mucus membranes or skin necrosis:unsure Has patient had a PCN reaction that required hospitalization:No Has patient had a PCN reaction occurring within the last 10 years:NO If all of the above answers are NO, then may proceed with Cephalosporin use. Has patient had a  PCN reaction causing immediate rash, facial/tongue/throat swelling, SOB or lightheadedness with hypotension:unsure Has patient had a PCN reaction causing severe rash involving mucus membranes or skin necrosis:unsure Has patient had a PCN reaction that required hospitalization:No Has patient had a PCN reaction occurring within the last 10 years:NO If all of the above answers are NO, then may proceed with Cephalosporin use.    Doxycycline  Other (See Comments)    REACTION: gi upset   Current Outpatient Medications on File Prior to Visit  Medication Sig Dispense Refill   alendronate  (FOSAMAX ) 70 MG tablet Take 1 tablet (70 mg total) by mouth every 7 (seven) days. Take with a full glass of water on an empty stomach. 12 tablet 3   aspirin  EC 81 MG tablet Take 1 tablet (81 mg total) by mouth daily. 100 tablet 3   atenolol  (TENORMIN ) 25 MG tablet Take 0.5 tablets (12.5 mg total) by mouth daily. 45 tablet 3   Blood Pressure Monitoring (OMRON 3 SERIES BP MONITOR) DEVI Use as directed. 1 each 0   Foot Care Products ([employer] GEL  HEEL CUSHION WOMENS) PADS Use as directed at bedtime 2 each 5   gabapentin  (NEURONTIN ) 300 MG capsule Take 1-2 tab by mouth at bedtime for pain and sleep 180 capsule 1   lidocaine -prilocaine  (EMLA ) cream Apply 1 Application topically as needed. 30 g 2   oxyCODONE  10 MG TABS Take 1 tablet (10 mg total) by mouth every 4 (four) hours as needed for severe pain (pain score 7-10). 30 tablet 0   polyethylene glycol powder (GLYCOLAX /MIRALAX ) 17 GM/SCOOP powder Take 17 g by mouth 2 (two) times daily as needed. 3350 g 1   prochlorperazine  (COMPAZINE ) 10 MG tablet Take 1 tablet (10 mg total) by mouth every 6 (six) hours as needed. 30 tablet 2   SYNTHROID  150 MCG tablet Take 1 tablet (150 mcg total) by mouth daily before breakfast. 90 tablet 3   temazepam  (RESTORIL ) 15 MG capsule Take 1 capsule (15 mg total) by mouth at bedtime as needed for sleep. 30 capsule 5   Triamcinolone  Acetonide (ZILRETTA ) 32 MG SRER intra-articular injection 5 mL, intra-articular, bilat knees 2 each 0   No current facility-administered medications on file prior to visit.        ROS:  All others reviewed and negative.  Objective        PE:  BP 120/78 (BP Location: Right Arm, Patient Position: Sitting, Cuff Size: Normal)   Pulse 76   Temp 98.5 F (36.9 C) (Oral)   Ht 5' 6 (1.676 m)   Wt 175 lb (79.4 kg)   SpO2 99%   BMI 28.25 kg/m                 Constitutional: Pt appears in NAD               HENT: Head: NCAT.                Right Ear: External ear normal.  Right TM with mod erythema               Left Ear: External ear normal.  Bilateral cerumen impactions resolved               Eyes: . Pupils are equal, round, and reactive to light. Conjunctivae and EOM are normal               Nose: without d/c or deformity  Neck: Neck supple. Gross normal ROM               Cardiovascular: Normal rate and regular rhythm.                 Pulmonary/Chest: Effort normal and breath sounds without rales or wheezing.                 Abd:  Soft, NT, ND, + BS, no organomegaly               Neurological: Pt is alert. At baseline orientation, motor grossly intact               Skin: Skin is warm. No rashes, no other new lesions, LE edema - none;  bilateral knee degenerative changes               Psychiatric: Pt behavior is normal without agitation   Micro: none  Cardiac tracings I have personally interpreted today:  none  Pertinent Radiological findings (summarize): none   Lab Results  Component Value Date   WBC 4.6 11/20/2023   HGB 12.7 11/20/2023   HCT 38.7 11/20/2023   PLT 190 11/20/2023   GLUCOSE 90 11/20/2023   CHOL 119 03/22/2023   TRIG 62 03/22/2023   HDL 22 (L) 03/22/2023   LDLDIRECT 131.0 03/27/2015   LDLCALC 85 03/22/2023   ALT 7 11/20/2023   AST 12 (L) 11/20/2023   NA 143 11/20/2023   K 3.7 11/20/2023   CL 107 11/20/2023   CREATININE 0.64 11/20/2023   BUN 5 (L) 11/20/2023   CO2 30 11/20/2023   TSH 22.700 (H) 11/20/2023   INR 1.2 03/27/2023   HGBA1C 5.5 02/21/2023   MICROALBUR 2.0 (H) 11/06/2009   Assessment/Plan:  [name] is a 78 y.o. Black or African American [2] female with  has a past medical history of AAA (abdominal aortic aneurysm), Acute ischemic colitis (07/29/2010), Acute respiratory infection (06/09/2014), Acute sinus infection (05/14/2015), Allergic rhinitis, cause unspecified, Angioedema of lips (06/03/2014), Anxiety state, unspecified, Arthritis of left hip (04/16/2015), Bilateral hearing loss (07/19/2017), Chronic LBP, Coronary artery disease, Coronary artery disease involving native heart without angina pectoris (07/31/2006), Degeneration of lumbar or lumbosacral intervertebral disc, Depressive disorder, not elsewhere classified, Diabetes (HCC) (11/03/2006), Diabetes mellitus, Frequent urination (05/08/2014), Gout (08/22/2013), History of endovascular stent graft for abdominal aortic aneurysm (AAA) (01/22/2016), History of radiation therapy, Hyperlipidemia, Hypertension,  Hypothyroidism, Iron  deficiency anemia (06/13/2017), Lower GI bleed (06/2010), Lumbar radiculopathy (05/15/2015), MENOPAUSAL DISORDER (10/29/2007), Myalgia (09/25/2013), Noncompliance with medications (02/26/2014), Obesity, unspecified, OTITIS MEDIA, SEROUS, CHRONIC (04/23/2007), Thoracic disc disease (11/19/2018), URTICARIA (09/23/2009), and Venous insufficiency (07/19/2017).  Right otitis media Mild to mod, for antibx course zpack,  to f/u any worsening symptoms or concerns  Bilateral impacted cerumen Bilateral, right > left, Resolved with irrigation  Allergic rhinitis Mild to mod, for prednisone  taper, to f/u any worsening symptoms or concerns  Vitamin D  deficiency Last vitamin D  Lab Results  Component Value Date   VD25OH 7.68 (L) 08/04/2022   Low, to start oral replacement   Diabetes [facility]) Lab Results  Component Value Date   HGBA1C 5.5 02/21/2023   Stable, pt to continue current medical treatment  - diet, wt control   Followup: Return in about 6 months (around 05/20/2024).  Lynwood Rush, MD 11/21/2023 8:44 PM [facility] - [facility] Internal Medicine

## 2023-11-21 NOTE — Assessment & Plan Note (Signed)
Mild to mod, for antibx course zpack,  to f/u any worsening symptoms or concerns 

## 2023-11-21 NOTE — Patient Instructions (Addendum)
 You had the flu shot today  Please consider having the Tdap tetanus shot at the pharmacy  Please take all new medication as prescribed - the antibiotic and prednisone   Your Ears were cleared today  Please continue all other medications as before, and refills have been done if requested.  Please have the pharmacy call with any other refills you may need.  Please continue your efforts at being more active, low cholesterol diet, and weight control.  Please keep your appointments with your specialists as you may have planned - optho next wk, and oncology  Please see Sports Medicine for the knees, as you may need the gel shots  Please go to the LAB at the blood drawing area for the tests to be done  You will be contacted by phone if any changes need to be made immediately.  Otherwise, you will receive a letter about your results with an explanation, but please check with MyChart first.  Please make an Appointment to return in 6 months, or sooner if needed

## 2023-11-21 NOTE — Assessment & Plan Note (Signed)
 Pt to make appt today with sports medicine on the first floor for possible gel shots to knees

## 2023-11-21 NOTE — Assessment & Plan Note (Signed)
 Lab Results  Component Value Date   HGBA1C 5.5 02/21/2023   Stable, pt to continue current medical treatment  - diet, wt control

## 2023-11-21 NOTE — Assessment & Plan Note (Addendum)
 Bilateral, right > left, Resolved with irrigation

## 2023-11-21 NOTE — Assessment & Plan Note (Signed)
 Mild to mod, for prednisone taper, to f/u any worsening symptoms or concerns

## 2023-11-21 NOTE — Assessment & Plan Note (Signed)
Last vitamin D Lab Results  Component Value Date   VD25OH 7.68 (L) 08/04/2022   Low, to start oral replacement

## 2023-11-23 ENCOUNTER — Ambulatory Visit: Payer: Self-pay | Admitting: Internal Medicine

## 2023-11-23 ENCOUNTER — Encounter: Payer: Self-pay | Admitting: Internal Medicine

## 2023-11-23 LAB — VITAMIN D 25 HYDROXY (VIT D DEFICIENCY, FRACTURES): VITD: <7 ng/mL — ABNORMAL LOW (ref 30.00–100.00)

## 2023-12-04 ENCOUNTER — Ambulatory Visit: Admitting: Family Medicine

## 2023-12-07 ENCOUNTER — Ambulatory Visit: Admitting: Family Medicine

## 2023-12-12 NOTE — Progress Notes (Signed)
 [facility] OFFICE PROGRESS NOTE  Brandi Brandi ORN, MD [address] [place] [address]  DIAGNOSIS: Stage IIIB (T4, N2b, M0) non-small cell lung cancer, squamous cell carcinoma. She presented with large pleural-based peripheral right upper lobe lung mass measuring up to 8.2 cm in size with large right paratracheal node with low level hypermetabolic uptake.   PDL1: 15  PRIOR THERAPY: concurrent chemoradiation with carboplatin  for an AUC of 2 and taxol  45 mg/m2. First dose expected on 05/08/23. Status post 4 cycles. Last dose was given on 05/30/2023.   CURRENT THERAPY: Consolidation treatment with immunotherapy with Imfinzi  1500 Mg IV every 4 weeks.  First dose 07/03/2023.  Status post 6 cycles.     INTERVAL HISTORY: [name] 78 y.o. female returns  to the clinic for [name] follow up visit accompanied by her daughter. The patient is feeling well today without any concerning complaints except for right breast pain.  She states this has been occurring intermittently for about 1 month. She describes the discomfort as throbbing. No recent falls or injuries could explain the pain, and staying still helps alleviate it. There is no erythema, mass, or discharge. She denies fevers. She denies falls or heavy lifting. She denies injuries. She takes oxycodone  for back pain which helps the intermittent right lateral breast pain. She denies changes in her breathing. She denies shortness of breath or cough. Her most recent mammogram was in February 2025 which was negative.   Otherwise, she is feeling well and tolerates her immunotherapy well. Denies any chills, night sweats, or weight loss. She states her appetite is coming back.  Denies any nausea, vomiting, or diarrhea. She has occasional constipation but manages it with medication. Denies any headache or visual changes. Denies any rashes or skin changes. The patient is here today for evaluation prior to starting cycle # 7     MEDICAL  HISTORY: Past Medical History:  Diagnosis Date   AAA (abdominal aortic aneurysm)    [name]. s/p stent graft repair 01/2016.   Acute ischemic colitis 07/29/2010   Diagnosed June, 2012 characterized by acute lower GI bleeding, spontaneously resolved.    Acute respiratory infection 06/09/2014   Acute sinus infection 05/14/2015   Allergic rhinitis, cause unspecified    Angioedema of lips 06/03/2014   Anxiety state, unspecified    Arthritis of left hip 04/16/2015   Bilateral hearing loss 07/19/2017   Chronic LBP    Coronary artery disease    [name]. inferior MI 1998 s/p PCI of RCA. b. stenting of Cx 04/2010. c. DES to prox LAD 05/2013. d. DES x 3 in 10/2015. // Myoview  07/2018:  EF 59, normal perfusion, low risk    Coronary artery disease involving native heart without angina pectoris 07/31/2006   Qualifier: Diagnosis of  By: [name]  ANGIOGRAPHIC DATA:  1. Ventriculography done in the RAO projection reveals [name] small wall  motion abnormality in the mid inferior wall. Ejection fraction  would be estimated at around 50%.  2. The right coronary artery has some progressive disease of about 60-  80% in the proximal mid segment. The distal vessel appears to be  50-70% narrowing althou   Degeneration of lumbar or lumbosacral intervertebral disc    Depressive disorder, not elsewhere classified    Diabetes (HCC) 11/03/2006   Qualifier: Diagnosis of  By: [name]    Diabetes mellitus    TYPE II   Frequent urination 05/08/2014   Gout 08/22/2013  History of endovascular stent graft for abdominal aortic aneurysm (AAA) 01/22/2016   History of radiation therapy    Right lung-04/19/23-05/29/23-Dr. Lynwood [name]   Hyperlipidemia    Hypertension    Hypothyroidism    Iron  deficiency anemia 06/13/2017   Lower GI bleed 06/2010   Diverticular bleed   Lumbar radiculopathy 05/15/2015   MENOPAUSAL DISORDER 10/29/2007   Qualifier: Diagnosis of  By: [name]    Myalgia 09/25/2013    Noncompliance with medications 02/26/2014   Obesity, unspecified    OTITIS MEDIA, SEROUS, CHRONIC 04/23/2007   Qualifier: Diagnosis of  By: [name]    Thoracic disc disease 11/19/2018   URTICARIA 09/23/2009   Qualifier: Diagnosis of  By: [name]    Venous insufficiency 07/19/2017    ALLERGIES:  is allergic to crestor  [rosuvastatin  calcium ], prilosec [omeprazole ], miconazole nitrate, augmentin  [amoxicillin -pot clavulanate], and doxycycline .  MEDICATIONS:  Current Outpatient Medications  Medication Sig Dispense Refill   alendronate  (FOSAMAX ) 70 MG tablet Take 1 tablet (70 mg total) by mouth every 7 (seven) days. Take with [name] full glass of water on an empty stomach. 12 tablet 3   aspirin  EC 81 MG tablet Take 1 tablet (81 mg total) by mouth daily. 100 tablet 3   atenolol  (TENORMIN ) 25 MG tablet TAKE 1/2 TABLET(12.5 MG TOTAL) BY MOUTH DAILY AT 9:00 AM. THIS IS [name] DECREASE IN DOSE 45 tablet 3   Blood Pressure Monitoring (OMRON 3 SERIES BP MONITOR) DEVI Use as directed. 1 each 0   Foot Care Products ([employer] GEL HEEL CUSHION WOMENS) PADS Use as directed at bedtime 2 each 5   gabapentin  (NEURONTIN ) 300 MG capsule Take 1-2 tab by mouth at bedtime for pain and sleep 180 capsule 1   lidocaine -prilocaine  (EMLA ) cream Apply 1 Application topically as needed. 30 g 2   oxyCODONE  10 MG TABS Take 1 tablet (10 mg total) by mouth every 4 (four) hours as needed for severe pain (pain score 7-10). 30 tablet 0   polyethylene glycol powder (GLYCOLAX /MIRALAX ) 17 GM/SCOOP powder Take 17 g by mouth 2 (two) times daily as needed. 3350 g 1   predniSONE  (DELTASONE ) 10 MG tablet 3 tabs by mouth per day for 3 days,2tabs per day for 3 days,1tab per day for 3 days 18 tablet 0   prochlorperazine  (COMPAZINE ) 10 MG tablet Take 1 tablet (10 mg total) by mouth every 6 (six) hours as needed. 30 tablet 2   SYNTHROID  150 MCG tablet Take 1 tablet (150 mcg total) by mouth daily before breakfast. 90 tablet 3    temazepam  (RESTORIL ) 15 MG capsule Take 1 capsule (15 mg total) by mouth at bedtime as needed for sleep. 30 capsule 5   Triamcinolone  Acetonide (ZILRETTA ) 32 MG SRER intra-articular injection 5 mL, intra-articular, bilat knees 2 each 0   No current facility-administered medications for this visit.   Facility-Administered Medications Ordered in Other Visits  Medication Dose Route Frequency Provider Last Rate Last Admin   0.9 %  sodium chloride  infusion   Intravenous Continuous Sherrod Sherrod, MD 10 mL/hr at 12/19/23 1243 New Bag at 12/19/23 1243   durvalumab  (IMFINZI ) 1,500 mg in sodium chloride  0.9 % 100 mL chemo infusion  1,500 mg Intravenous Once Mohamed, Mohamed, MD 130 mL/hr at 12/19/23 1308 1,500 mg at 12/19/23 1308    SURGICAL HISTORY:  Past Surgical History:  Procedure Laterality Date   CARDIAC CATHETERIZATION     PCI OF BOTH THE CIRCUMFLEX AND LEFT ANTERIOR DESCENDING  ARTERY   CARDIAC CATHETERIZATION N/[name] 10/29/2015   Procedure: Coronary Stent Intervention;  Surgeon: Lonni Hanson, MD;  Location: [facility] INVASIVE CV LAB;  Service: Cardiovascular;  Laterality: N/[name];   CARDIAC CATHETERIZATION N/[name] 10/29/2015   Procedure: Coronary/Graft Angiography;  Surgeon: Lonni Hanson, MD;  Location: [facility] INVASIVE CV LAB;  Service: Cardiovascular;  Laterality: N/[name];   CARDIAC CATHETERIZATION N/[name] 10/29/2015   Procedure: Intravascular Pressure Wire/FFR Study;  Surgeon: Lonni Hanson, MD;  Location: [facility] INVASIVE CV LAB;  Service: Cardiovascular;  Laterality: N/[name];   CESAREAN SECTION     ENDOVASCULAR STENT INSERTION N/[name] 01/22/2016   Procedure: ABDOMINAL AORTIC ENDOVASCULAR STENT GRAFT INSERTION;  Surgeon: Krystal JULIANNA Doing, MD;  Location: [facility] OR;  Service: Vascular;  Laterality: N/[name];   HEART STENT  04-2010  and  Jun 07, 2013   X 3   IR EMBO ARTERIAL NOT HEMORR HEMANG INC GUIDE ROADMAPPING  07/17/2023   IR IMAGING GUIDED PORT INSERTION  05/17/2023   IR RADIOLOGIST EVAL & MGMT  06/21/2023   LEFT HEART CATH AND  CORONARY ANGIOGRAPHY N/[name] 04/03/2019   Procedure: LEFT HEART CATH AND CORONARY ANGIOGRAPHY;  Surgeon: Hanson Lonni, MD;  Location: [facility] INVASIVE CV LAB;  Service: Cardiovascular;  Laterality: N/[name];   LEFT HEART CATHETERIZATION WITH CORONARY ANGIOGRAM N/[name] 06/07/2013   Procedure: LEFT HEART CATHETERIZATION WITH CORONARY ANGIOGRAM;  Surgeon: Lonni JONETTA Cash, MD;  Location: [facility] CATH LAB;  Service: Cardiovascular;  Laterality: N/[name];   LEFT HEART CATHETERIZATION WITH CORONARY ANGIOGRAM N/[name] 02/25/2014   Procedure: LEFT HEART CATHETERIZATION WITH CORONARY ANGIOGRAM;  Surgeon: Debby DELENA Sor, MD;  Location: [facility] CATH LAB;  Service: Cardiovascular;  Laterality: N/[name];   LUMBAR FUSION  01/2007   DR. GUST...3-LEVEL WITH FIXATION   OOPHORECTOMY     BSO? pt.unsure   PARATHYROIDECTOMY     SPINE SURGERY     THYROIDECTOMY     TOTAL ABDOMINAL HYSTERECTOMY      REVIEW OF SYSTEMS:   Review of Systems  Constitutional: Positive for fatigue. Negative for appetite change, chills, fever and unexpected weight change.  HENT: Negative for mouth sores, nosebleeds, sore throat and trouble swallowing.   Eyes: Negative for eye problems and icterus.  Respiratory: Negative for cough, hemoptysis, shortness of breath and wheezing.   Cardiovascular: Negative for chest pain and leg swelling.  Gastrointestinal: Negative for abdominal pain, constipation, diarrhea, nausea and vomiting.  Genitourinary: Negative for bladder incontinence, difficulty urinating, dysuria, frequency and hematuria.   Musculoskeletal: Positive for right lateral rib pain (no tenderness to palpation or overlying skin changes). Negative for  gait problem, neck pain and neck stiffness.  Skin: Negative for itching and rash.  Neurological: Negative for dizziness, extremity weakness, gait problem, headaches, light-headedness and seizures.  Hematological: Negative for adenopathy. Does not bruise/bleed easily.  Psychiatric/Behavioral: Negative for confusion,  depression and sleep disturbance. The patient is not nervous/anxious.     PHYSICAL EXAMINATION:  Blood pressure (!) 100/59, pulse 61, temperature 98.4 F (36.9 C), temperature source Temporal, resp. rate 16, height 5' 6 (1.676 m), weight 176 lb 8 oz (80.1 kg), SpO2 100%.  ECOG PERFORMANCE STATUS: 1-2  Physical Exam  Constitutional: Oriented to person, place, and time and well-developed, well-nourished, and in no distress.  HENT:  Head: Normocephalic and atraumatic.  Mouth/Throat: Oropharynx is clear and moist. No oropharyngeal exudate.  Eyes: Conjunctivae are normal. Right eye exhibits no discharge. Left eye exhibits no discharge. No scleral icterus.  Neck: Normal range of motion. Neck supple.  Cardiovascular: Normal rate, regular rhythm, normal heart  sounds and intact distal pulses.   Pulmonary/Chest: Effort normal and breath sounds normal. No respiratory distress. No wheezes. No rales.  Abdominal: Soft. Bowel sounds are normal. Exhibits no distension and no mass. There is no tenderness.  Musculoskeletal: Normal range of motion. Exhibits no edema.  Lymphadenopathy:    No cervical adenopathy.  Neurological: Alert and oriented to person, place, and time. Exhibits normal muscle tone. Examined in the wheelchair.   Skin: Skin is warm and dry. No rash noted. Not diaphoretic. No erythema. No pallor.  Breast: I did not appreciate any lumps, erythema, discharge, swelling, or skin thickening. No tenderness to palpation.  Psychiatric: Mood, memory and judgment normal.  Vitals reviewed.    LABORATORY DATA: Lab Results  Component Value Date   WBC 5.6 12/19/2023   HGB 12.7 12/19/2023   HCT 38.8 12/19/2023   MCV 89.8 12/19/2023   PLT 277 12/19/2023      Chemistry      Component Value Date/Time   NA 142 12/19/2023 1127   NA 141 06/08/2021 1606   K 3.9 12/19/2023 1127   CL 105 12/19/2023 1127   CO2 29 12/19/2023 1127   BUN 8 12/19/2023 1127   BUN 4 (L) 06/08/2021 1606   CREATININE  0.55 12/19/2023 1127      Component Value Date/Time   CALCIUM  8.7 (L) 12/19/2023 1127   ALKPHOS 117 12/19/2023 1127   AST 19 12/19/2023 1127   ALT 5 12/19/2023 1127   BILITOT 0.5 12/19/2023 1127       RADIOGRAPHIC STUDIES:  No results found.   ASSESSMENT/PLAN:  This is [name] very pleasant 78 year old African-American female with Stage IIIB (T4, N2b, M0) non-small cell lung cancer, squamous cell carcinoma. She presented with large pleural-based peripheral right upper lobe lung mass measuring up to 8.2 cm in size with large right paratracheal node with low level hypermetabolic uptake.   She underwent [name] course of concurrent chemoradiation with weekly carboplatin  for AUC of 2 and paclitaxel  45 Mg/M2.  Status post 4 cycles.   The patient is currently undergoing consolidation immunotherapy with Imfinzi  1500 mg IV every 4 weeks. She is status post 6 cycles.    Labs were reviewed. Recommend she proceed with cycle #7 today as scheduled.    We will see her in 4 weeks before undergoing cycle #8.   I did not appreciate any concerns on breast exam today. There was no overlying erythema, warmth, swelling, masses, or drainage. She is up to date on her mammogram. Her pain may be referred pain from her lung as her disease involves the pleural. I have ordered her next CT scan to evaluate her malignancy. I also ordered US  of the breast to ensure no breast pathology. Her oxycodone  that she takes for her back helps her intermittent breast pain. She also was advised to use heating pads and tylenol  if needed. She denies respiratory symptoms or recent injuries, or systemic symptoms.   I will arrange for [name] restaging CT scan of the chest prior to the next cycle of treatment.    We will see her back in 4 weeks before her next cycle of treatment. Of course, if any concerning findings on her imaging studies, I will arrange for her to be seen sooner.   She is scheduled to see palliative care while in the infusion  room today.   The patient was advised to call immediately if she has any concerning symptoms in the interval. The patient voices understanding of current  disease status and treatment options and is in agreement with the current care plan. All questions were answered. The patient knows to call the clinic with any problems, questions or concerns. We can certainly see the patient much sooner if necessary    Orders Placed This Encounter  Procedures   CT Chest W Contrast    Standing Status:   Future    Expected Date:   01/09/2024    Expiration Date:   12/18/2024    If indicated for the ordered procedure, I authorize the administration of contrast media per Radiology protocol:   Yes    Does the patient have [name] contrast media/X-ray dye allergy?:   No    Preferred imaging location?:   [facility]   US  LIMITED ULTRASOUND INCLUDING AXILLA RIGHT BREAST    Standing Status:   Future    Expected Date:   12/21/2023    Expiration Date:   12/18/2024    Reason for Exam (SYMPTOM  OR DIAGNOSIS REQUIRED):   Patient reporting right lateral breast pain. Ensure not [name] breast pathology    Preferred imaging location?:   [facility]     The total time spent in the appointment was 30-39 minutes  Marella Vanderpol L Isiaah Cuervo, PA-C 12/19/23

## 2023-12-16 ENCOUNTER — Other Ambulatory Visit: Payer: Self-pay | Admitting: Internal Medicine

## 2023-12-18 ENCOUNTER — Other Ambulatory Visit: Payer: Self-pay

## 2023-12-18 ENCOUNTER — Ambulatory Visit: Admitting: Family Medicine

## 2023-12-18 VITALS — BP 104/68 | Ht 66.0 in | Wt 175.0 lb

## 2023-12-18 DIAGNOSIS — M25562 Pain in left knee: Secondary | ICD-10-CM

## 2023-12-18 DIAGNOSIS — G8929 Other chronic pain: Secondary | ICD-10-CM

## 2023-12-18 DIAGNOSIS — M170 Bilateral primary osteoarthritis of knee: Secondary | ICD-10-CM

## 2023-12-18 DIAGNOSIS — M25561 Pain in right knee: Secondary | ICD-10-CM | POA: Diagnosis not present

## 2023-12-18 MED ORDER — TRIAMCINOLONE ACETONIDE 32 MG IX SRER
32.0000 mg | Freq: Once | INTRA_ARTICULAR | Status: AC
  Administered 2023-12-18: 32 mg via INTRA_ARTICULAR

## 2023-12-18 NOTE — Progress Notes (Signed)
 LILLETTE Ileana Collet, PhD, LAT, ATC acting as a scribe for Artist Lloyd, MD.  [name] is a 78 y.o. female who presents to [facility] today for  exacerbation of her bilat knee pain. Pt was last seen by Dr. Lloyd on 09/22/23 and was given repeat bilateral Zilretta  injections.   Today, pt reports bilat knee pain returned a few wks ago. She states pain in her knees is terrible. She reports not wanting to go anywhere or do anything, do to severe knee pain  Dx testing: 06/23/23 DEXA 06/21/23 R & L knee XR 11/21/19 R & L knee XR   Pertinent review of systems: No fevers or chills  Relevant historical information: Lung cancer   Exam:  BP 104/68   Ht 5' 6 (1.676 m)   Wt 175 lb (79.4 kg)   BMI 28.25 kg/m  General: Well Developed, well nourished, and in no acute distress.   MSK: Knees bilaterally moderate effusion decreased range of motion.    Lab and Radiology Results   Zilretta  injection bilateral knee Procedure: Real-time Ultrasound Guided Injection of right knee joint superior lateral patellar space Device: Philips Affiniti 50G Images permanently stored and available for review in PACS Verbal informed consent obtained.  Discussed risks and benefits of procedure. Warned about infection, hyperglycemia bleeding, damage to structures among others. Patient expresses understanding and agreement Time-out conducted.   Noted no overlying erythema, induration, or other signs of local infection.   Skin prepped in a sterile fashion.   Local anesthesia: Topical Ethyl chloride.   With sterile technique and under real time ultrasound guidance: Zilretta  32 mg injected into knee joint. Fluid seen entering the joint capsule.   Completed without difficulty   Advised to call if fevers/chills, erythema, induration, drainage, or persistent bleeding.   Images permanently stored and available for review in the ultrasound unit.  Impression: Technically successful  ultrasound guided injection.  Procedure: Real-time Ultrasound Guided Injection of left knee joint superior lateral patellar space Device: Philips Affiniti 50G Images permanently stored and available for review in PACS Verbal informed consent obtained.  Discussed risks and benefits of procedure. Warned about infection, hyperglycemia bleeding, damage to structures among others. Patient expresses understanding and agreement Time-out conducted.   Noted no overlying erythema, induration, or other signs of local infection.   Skin prepped in a sterile fashion.   Local anesthesia: Topical Ethyl chloride.   With sterile technique and under real time ultrasound guidance: Zilretta  32 mg injected into knee joint. Fluid seen entering the joint capsule.   Completed without difficulty   Advised to call if fevers/chills, erythema, induration, drainage, or persistent bleeding.   Images permanently stored and available for review in the ultrasound unit.  Impression: Technically successful ultrasound guided injection.  Lot number: [id number]     Assessment and Plan: 78 y.o. female with bilateral knee pain due to DJD.  Patient is not a good candidate for knee replacement.  Plan for Zilretta  injections today.  Consider repeat injection in 3 months.   PDMP not reviewed this encounter. Orders Placed This Encounter  Procedures   US  LIMITED JOINT SPACE STRUCTURES LOW BILAT(NO LINKED CHARGES)    Reason for Exam (SYMPTOM  OR DIAGNOSIS REQUIRED):   bilateral knee OA    Preferred imaging location?:   [facility] ordered this encounter  Medications   Triamcinolone  Acetonide (ZILRETTA ) intra-articular injection 32 mg   Triamcinolone  Acetonide (ZILRETTA ) intra-articular injection 32 mg  Discussed warning signs or symptoms. Please see discharge instructions. Patient expresses understanding.   The above documentation has been reviewed and is accurate and complete Artist Lloyd,  M.D.

## 2023-12-18 NOTE — Patient Instructions (Addendum)
 Thank you for coming in today.   You received an injection today. Seek immediate medical attention if the joint becomes red, extremely painful, or is oozing fluid.   Can repeat these injections in 3 months, just let us  know ahead of time, so we can get them authorized by your insurance

## 2023-12-19 ENCOUNTER — Encounter: Payer: Self-pay | Admitting: Nurse Practitioner

## 2023-12-19 ENCOUNTER — Inpatient Hospital Stay

## 2023-12-19 ENCOUNTER — Inpatient Hospital Stay: Attending: Internal Medicine

## 2023-12-19 ENCOUNTER — Inpatient Hospital Stay (HOSPITAL_BASED_OUTPATIENT_CLINIC_OR_DEPARTMENT_OTHER): Admitting: Nurse Practitioner

## 2023-12-19 ENCOUNTER — Inpatient Hospital Stay: Attending: Internal Medicine | Admitting: Physician Assistant

## 2023-12-19 VITALS — BP 100/59 | HR 61 | Temp 98.4°F | Resp 16 | Ht 66.0 in | Wt 176.5 lb

## 2023-12-19 DIAGNOSIS — C3411 Malignant neoplasm of upper lobe, right bronchus or lung: Secondary | ICD-10-CM | POA: Insufficient documentation

## 2023-12-19 DIAGNOSIS — Z5112 Encounter for antineoplastic immunotherapy: Secondary | ICD-10-CM

## 2023-12-19 DIAGNOSIS — G893 Neoplasm related pain (acute) (chronic): Secondary | ICD-10-CM

## 2023-12-19 DIAGNOSIS — N644 Mastodynia: Secondary | ICD-10-CM

## 2023-12-19 DIAGNOSIS — K5903 Drug induced constipation: Secondary | ICD-10-CM

## 2023-12-19 DIAGNOSIS — Z515 Encounter for palliative care: Secondary | ICD-10-CM

## 2023-12-19 DIAGNOSIS — R530 Neoplastic (malignant) related fatigue: Secondary | ICD-10-CM

## 2023-12-19 DIAGNOSIS — K5900 Constipation, unspecified: Secondary | ICD-10-CM | POA: Insufficient documentation

## 2023-12-19 LAB — CBC WITH DIFFERENTIAL (CANCER CENTER ONLY)
Abs Immature Granulocytes: 0.01 K/uL (ref 0.00–0.07)
Basophils Absolute: 0 K/uL (ref 0.0–0.1)
Basophils Relative: 0 %
Eosinophils Absolute: 0 K/uL (ref 0.0–0.5)
Eosinophils Relative: 0 %
HCT: 38.8 % (ref 36.0–46.0)
Hemoglobin: 12.7 g/dL (ref 12.0–15.0)
Immature Granulocytes: 0 %
Lymphocytes Relative: 11 %
Lymphs Abs: 0.6 K/uL — ABNORMAL LOW (ref 0.7–4.0)
MCH: 29.4 pg (ref 26.0–34.0)
MCHC: 32.7 g/dL (ref 30.0–36.0)
MCV: 89.8 fL (ref 80.0–100.0)
Monocytes Absolute: 0.3 K/uL (ref 0.1–1.0)
Monocytes Relative: 6 %
Neutro Abs: 4.7 K/uL (ref 1.7–7.7)
Neutrophils Relative %: 83 %
Platelet Count: 277 K/uL (ref 150–400)
RBC: 4.32 MIL/uL (ref 3.87–5.11)
RDW: 14.5 % (ref 11.5–15.5)
WBC Count: 5.6 K/uL (ref 4.0–10.5)
nRBC: 0 % (ref 0.0–0.2)

## 2023-12-19 LAB — CMP (CANCER CENTER ONLY)
ALT: 5 U/L (ref 0–44)
AST: 19 U/L (ref 15–41)
Albumin: 3.5 g/dL (ref 3.5–5.0)
Alkaline Phosphatase: 117 U/L (ref 38–126)
Anion gap: 9 (ref 5–15)
BUN: 8 mg/dL (ref 8–23)
CO2: 29 mmol/L (ref 22–32)
Calcium: 8.7 mg/dL — ABNORMAL LOW (ref 8.9–10.3)
Chloride: 105 mmol/L (ref 98–111)
Creatinine: 0.55 mg/dL (ref 0.44–1.00)
GFR, Estimated: >60 mL/min (ref >60)
Glucose, Bld: 146 mg/dL — ABNORMAL HIGH (ref 70–99)
Potassium: 3.9 mmol/L (ref 3.5–5.1)
Sodium: 142 mmol/L (ref 135–145)
Total Bilirubin: 0.5 mg/dL (ref 0.0–1.2)
Total Protein: 7.5 g/dL (ref 6.5–8.1)

## 2023-12-19 MED ORDER — OXYCODONE HCL 10 MG PO TABS: 10.0000 mg | ORAL_TABLET | ORAL | 0 refills | Status: AC | PRN

## 2023-12-19 MED ORDER — SODIUM CHLORIDE 0.9 % IV SOLN
1500.0000 mg | Freq: Once | INTRAVENOUS | Status: AC
  Administered 2023-12-19: 1500 mg via INTRAVENOUS
  Filled 2023-12-19: qty 30

## 2023-12-19 MED ORDER — SODIUM CHLORIDE 0.9 % IV SOLN: INTRAVENOUS | Status: DC

## 2023-12-19 NOTE — Progress Notes (Signed)
 Palliative Medicine [facility]  [phone] [phone]   Name: [name] Date: 12/19/2023 MRN: [id number]  DOB: June 21, 1945  Patient Care Team: [name], MD as PCP - Diedre Wonda Sharper, MD as PCP - Cardiology (Cardiology) Tobie Tonita POUR, DO as Consulting Physician (Neurology) Lelon Glendia ONEIDA DEVONNA as Physician Assistant (Cardiology) Joane Artist RAMAN, MD as Consulting Physician (Family Medicine) Prentis Duwaine BROCKS, RN as Oncology Nurse Navigator Pickenpack-Cousar, Fannie SAILOR, NP as Nurse Practitioner [facility] and Palliative Medicine)   INTERVAL HISTORY: [name] is a 78 y.o. female with  oncologic medical history including stage IIIB non-small cell lung cancer (03/2023) s/p concurrent chemoradiation with plans for immunotherapy starting 07/03/23.  Palliative is seeing patient for symptom management and goals of care.   SOCIAL HISTORY:     reports that she quit smoking about 37 years ago. Her smoking use included cigarettes. She started smoking about 67 years ago. She has never used smokeless tobacco. She reports that she does not drink alcohol and does not use drugs.  ADVANCE DIRECTIVES:  Completed on 06/26/23. Daughter [name] is designated HCPOA.   CODE STATUS: DNR  PAST MEDICAL HISTORY: Past Medical History:  Diagnosis Date   AAA (abdominal aortic aneurysm)    a. s/p stent graft repair 01/2016.   Acute ischemic colitis 07/29/2010   Diagnosed June, 2012 characterized by acute lower GI bleeding, spontaneously resolved.    Acute respiratory infection 06/09/2014   Acute sinus infection 05/14/2015   Allergic rhinitis, cause unspecified    Angioedema of lips 06/03/2014   Anxiety state, unspecified    Arthritis of left hip 04/16/2015   Bilateral hearing loss 07/19/2017   Chronic LBP    Coronary artery disease    a. inferior MI 1998 s/p PCI of RCA. b. stenting of Cx 04/2010. c. DES to prox LAD 05/2013. d. DES x 3 in  10/2015. // Myoview  07/2018:  EF 59, normal perfusion, low risk    Coronary artery disease involving native heart without angina pectoris 07/31/2006   Qualifier: Diagnosis of  By: Harlow MD, Sharper BRAVO  ANGIOGRAPHIC DATA:  1. Ventriculography done in the RAO projection reveals a small wall  motion abnormality in the mid inferior wall. Ejection fraction  would be estimated at around 50%.  2. The right coronary artery has some progressive disease of about 60-  80% in the proximal mid segment. The distal vessel appears to be  50-70% narrowing althou   Degeneration of lumbar or lumbosacral intervertebral disc    Depressive disorder, not elsewhere classified    Diabetes (HCC) 11/03/2006   Qualifier: Diagnosis of  By: [name] MD, Lynwood ORN    Diabetes mellitus    TYPE II   Frequent urination 05/08/2014   Gout 08/22/2013   History of endovascular stent graft for abdominal aortic aneurysm (AAA) 01/22/2016   History of radiation therapy    Right lung-04/19/23-05/29/23-Dr. [name]   Hyperlipidemia    Hypertension    Hypothyroidism    Iron  deficiency anemia 06/13/2017   Lower GI bleed 06/2010   Diverticular bleed   Lumbar radiculopathy 05/15/2015   MENOPAUSAL DISORDER 10/29/2007   Qualifier: Diagnosis of  By: [name] MD, Lynwood ORN    Myalgia 09/25/2013   Noncompliance with medications 02/26/2014   Obesity, unspecified    OTITIS MEDIA, SEROUS, CHRONIC 04/23/2007   Qualifier: Diagnosis of  By: Harlow MD, Sharper BRAVO    Thoracic disc disease 11/19/2018   URTICARIA 09/23/2009  Qualifier: Diagnosis of  By: Inocencio MD, Berwyn A    Venous insufficiency 07/19/2017    ALLERGIES:  is allergic to crestor  [rosuvastatin  calcium ], prilosec [omeprazole ], miconazole nitrate, augmentin  [amoxicillin -pot clavulanate], and doxycycline .  MEDICATIONS:  Current Outpatient Medications  Medication Sig Dispense Refill   alendronate  (FOSAMAX ) 70 MG tablet Take 1 tablet (70 mg total) by mouth every 7 (seven) days. Take  with a full glass of water on an empty stomach. 12 tablet 3   aspirin  EC 81 MG tablet Take 1 tablet (81 mg total) by mouth daily. 100 tablet 3   atenolol  (TENORMIN ) 25 MG tablet TAKE 1/2 TABLET(12.5 MG TOTAL) BY MOUTH DAILY AT 9:00 AM. THIS IS A DECREASE IN DOSE 45 tablet 3   Blood Pressure Monitoring (OMRON 3 SERIES BP MONITOR) DEVI Use as directed. 1 each 0   Foot Care Products ([employer] GEL HEEL CUSHION WOMENS) PADS Use as directed at bedtime 2 each 5   gabapentin  (NEURONTIN ) 300 MG capsule Take 1-2 tab by mouth at bedtime for pain and sleep 180 capsule 1   lidocaine -prilocaine  (EMLA ) cream Apply 1 Application topically as needed. 30 g 2   oxyCODONE  10 MG TABS Take 1 tablet (10 mg total) by mouth every 4 (four) hours as needed for severe pain (pain score 7-10). 30 tablet 0   polyethylene glycol powder (GLYCOLAX /MIRALAX ) 17 GM/SCOOP powder Take 17 g by mouth 2 (two) times daily as needed. 3350 g 1   predniSONE  (DELTASONE ) 10 MG tablet 3 tabs by mouth per day for 3 days,2tabs per day for 3 days,1tab per day for 3 days 18 tablet 0   prochlorperazine  (COMPAZINE ) 10 MG tablet Take 1 tablet (10 mg total) by mouth every 6 (six) hours as needed. 30 tablet 2   SYNTHROID  150 MCG tablet Take 1 tablet (150 mcg total) by mouth daily before breakfast. 90 tablet 3   temazepam  (RESTORIL ) 15 MG capsule Take 1 capsule (15 mg total) by mouth at bedtime as needed for sleep. 30 capsule 5   Triamcinolone  Acetonide (ZILRETTA ) 32 MG SRER intra-articular injection 5 mL, intra-articular, bilat knees 2 each 0   No current facility-administered medications for this visit.   Facility-Administered Medications Ordered in Other Visits  Medication Dose Route Frequency Provider Last Rate Last Admin   0.9 %  sodium chloride  infusion   Intravenous Continuous Sherrod Sherrod, MD   Stopped at 12/19/23 1413    VITAL SIGNS: There were no vitals taken for this visit. There were no vitals filed for this visit.   Estimated body mass  index is 28.49 kg/m as calculated from the following:   Height as of an earlier encounter on 12/19/23: 5' 6 (1.676 m).   Weight as of an earlier encounter on 12/19/23: 176 lb 8 oz (80.1 kg).   PERFORMANCE STATUS (ECOG) : 1 - Symptomatic but completely ambulatory  IMPRESSION: Discussed the use of AI scribe software for clinical note transcription with the patient, who gave verbal consent to proceed.  History of Present Illness Mrs. Kyler Talamante is a 78 year old female with non-small cell lung cancer currently undergoing immunotherapy treatments who was seen during her infusion for symptom management follow-up. No acute distress. Denies concerns with nausea, vomiting, constipation, or diarrhea. Occasional fatigue. Tries to remain as active as possible.   States she had a great Thanksgiving holiday with her family.  Her appetite continues to do well.  Weight is stable at 176 pounds. Appetite is improving. Weight is up to 176lbs.  Ms. Mcinnis reports experiencing right breast pain, describing it as 'carving' and 'throbbing'.  Pain comes and goes but is significant when it occurs.  She was seen by her oncology team today with plans for right breast ultrasound and upcoming CT scan to evaluate for any abnormalities.  She also experiences knee pain. A recent visit with her orthopedic team provided some relief, and she is managing the condition with exercise and dietary modifications.  During this visit she received bilateral Zilretta  injections.  We discussed her pain at length. Wing reports her overall pain is well-controlled on current regimen.  Some days are better than others. She is taking oxycodone  5-10 mg as needed. She is not requiring daily use of oxycodone .  No adjustments to current regimen at this time.  All questions answered and support provided.   Goals of Care 06/26/23: We discussed her current illness and what it means in the larger context of her on-going co-morbidities. Natural  disease trajectory and expectations were discussed. Patient and her daughter are realistic in their understanding of patient's overall condition and cancer state.    I empathetically approached discussions regarding healthcare limitations, code status, and advanced directives. Patient does not have advanced directives however reports interest in completing. Extensive education provided on AD documents. Patient would like to complete at advanced directive clinic today. She expresses wishes to name her daughter, Tramaine as her medical decision maker in the event she is unable to speak for herself, desires a natural death with no CPR or life-sustaining measures in a terminal or comatose state. Open to artificial feedings for trial if condition is reversible.    Ms. Pulse is clear in her expressed wishes to continue to treat the treatable allowing her every opportunity to continue thriving while managing her symptoms to minimize discomfort.   We discussed the patient's current illness and what it means in the larger context of their on-going co-morbidities. Natural disease trajectory and expectations were discussed.  I discussed the importance of continued conversation with family and their medical providers regarding overall plan of care and treatment options, ensuring decisions are within the context of the patients values and GOCs.  Assessment & Plan  Cancer Related Pain management Chronic right-sided pain managed effectively with oxycodone .  - Continue oxycodone  10 mg, 1-2 tablets as needed every 4-6hrs for pain. Will send refill in for 10mg  to prevent patient from having to take 2 tablets.  - Instruct to contact when down to 2-3 days' supply for refill.   Goals of Care Discussed advanced directives and goals of care. She desires a natural death without life-sustaining measures. - Completed advanced directives paperwork at AD clinic. Documents filed. - Daughter is designated as designer, television/film set.   I will plan to follow-up with patient in 3-4 weeks. Sooner if needed.    Patient expressed understanding and was in agreement with this plan. She also understands that She can call the clinic at any time with any questions, concerns, or complaints.   Any controlled substances utilized were prescribed in the context of palliative care. PDMP has been reviewed.   Visit consisted of counseling and education dealing with the complex and emotionally intense issues of symptom management and palliative care in the setting of serious and potentially life-threatening illness.  Levon Borer, AGPCNP-BC  Palliative Medicine Team/[facility]

## 2023-12-19 NOTE — Patient Instructions (Signed)
 [facility] - A DEPT OF [facility]  Discharge Instructions: Thank you for choosing [facility] to provide your oncology and hematology care.   If you have a lab appointment with the Cancer Center, please go directly to the Cancer Center and check in at the registration area.   Wear comfortable clothing and clothing appropriate for easy access to any Portacath or PICC line.   We strive to give you quality time with your provider. You may need to reschedule your appointment if you arrive late (15 or more minutes).  Arriving late affects you and other patients whose appointments are after yours.  Also, if you miss three or more appointments without notifying the office, you may be dismissed from the clinic at the provider's discretion.      For prescription refill requests, have your pharmacy contact our office and allow 72 hours for refills to be completed.    Today you received the following chemotherapy and/or immunotherapy agents: durvalumab (IMFINZI)       To help prevent nausea and vomiting after your treatment, we encourage you to take your nausea medication as directed.  BELOW ARE SYMPTOMS THAT SHOULD BE REPORTED IMMEDIATELY: *FEVER GREATER THAN 100.4 F (38 C) OR HIGHER *CHILLS OR SWEATING *NAUSEA AND VOMITING THAT IS NOT CONTROLLED WITH YOUR NAUSEA MEDICATION *UNUSUAL SHORTNESS OF BREATH *UNUSUAL BRUISING OR BLEEDING *URINARY PROBLEMS (pain or burning when urinating, or frequent urination) *BOWEL PROBLEMS (unusual diarrhea, constipation, pain near the anus) TENDERNESS IN MOUTH AND THROAT WITH OR WITHOUT PRESENCE OF ULCERS (sore throat, sores in mouth, or a toothache) UNUSUAL RASH, SWELLING OR PAIN  UNUSUAL VAGINAL DISCHARGE OR ITCHING   Items with * indicate a potential emergency and should be followed up as soon as possible or go to the Emergency Department if any problems should occur.  Please show the CHEMOTHERAPY ALERT CARD or  IMMUNOTHERAPY ALERT CARD at check-in to the Emergency Department and triage nurse.  Should you have questions after your visit or need to cancel or reschedule your appointment, please contact [facility] - A DEPT OF [facility]  Dept: [phone]  and follow the prompts.  Office hours are 8:00 a.m. to 4:30 p.m. Monday - Friday. Please note that voicemails left after 4:00 p.m. may not be returned until the following business day.  We are closed weekends and major holidays. You have access to a nurse at all times for urgent questions. Please call the main number to the clinic Dept: [phone] and follow the prompts.   For any non-urgent questions, you may also contact your provider using MyChart. We now offer e-Visits for anyone 39 and older to request care online for non-urgent symptoms. For details visit [email or web address].   Also download the MyChart app! Go to the app store, search "MyChart", open the app, select [place], and log in with your MyChart username and password.

## 2023-12-20 ENCOUNTER — Other Ambulatory Visit: Payer: Self-pay | Admitting: Physician Assistant

## 2023-12-20 ENCOUNTER — Encounter: Payer: Self-pay | Admitting: Physician Assistant

## 2023-12-20 DIAGNOSIS — N644 Mastodynia: Secondary | ICD-10-CM

## 2023-12-22 ENCOUNTER — Telehealth: Payer: Self-pay

## 2023-12-22 NOTE — Telephone Encounter (Signed)
 Copied from CRM [id number]. Topic: General - Other >> Dec 22, 2023  1:25 PM Pinkey ORN wrote: Reason for CRM: Requesting Office Call Back >> Dec 22, 2023  1:26 PM Pinkey ORN wrote: Patient is requesting an office call back from Dr. Nicola nurse. Patient states it's regarding her ear but declined to give any further information.

## 2023-12-26 ENCOUNTER — Telehealth: Payer: Self-pay

## 2023-12-26 NOTE — Telephone Encounter (Signed)
 Pt daughter had a questions regarding her FMLA form on the behalf of her mother. She wanted to know why her days were for three months. I explain to her that the last form was based off the first treatment plan. Pt is currently still on the treatment. I let the pt daughter know to send in a new form to reflect the current treatment plan. Pt daughter verbalized understanding.

## 2023-12-27 ENCOUNTER — Encounter: Payer: Self-pay | Admitting: Internal Medicine

## 2024-01-03 NOTE — Telephone Encounter (Signed)
 Called patient's daughter to get more details regarding the letter she needs per Diane/RN. The daughter's name is Tramaine Dandridge DOB 03/20/1978.  The letter need to be addressed to [facility] and Dance Movement Psychotherapist.  The letter need to say something to the effects of needing to be out whenever her mom (the patient) is sick due to treatment, out due to treatment,  doctor appointments, out when patient falls, and etc all due to the daughter being the care taker.  She can pick up the note when ready or request it to be scanned into her personal My Chart She can be reached at [phone].

## 2024-01-03 NOTE — Telephone Encounter (Signed)
 Patient's daughter Sherhonda Gaspar ) called regarding the My chart message.  States she had FMLA papers completed due to her being her mother's caretaker.  She works at an [employer] and they recently requested something stating that the patient [employer] treatment is still ongoing.  Daughter states she has to be out with patient when she falls or is not doing well.  She need a letter for the ongoing treatment/care of her mother.  My chart message was sent on 12/27/23 daughter states.  Please advise

## 2024-01-04 ENCOUNTER — Encounter: Payer: Self-pay | Admitting: Medical Oncology

## 2024-01-04 NOTE — Progress Notes (Signed)
 Spoke with patients daughter in regards to letter needed for FMLA.  Letter scanned to daughters email at [email or web address].

## 2024-01-05 ENCOUNTER — Ambulatory Visit: Admitting: Family Medicine

## 2024-01-05 NOTE — Telephone Encounter (Signed)
 Pt has appt scheduled for Monday 01/08/24.

## 2024-01-08 ENCOUNTER — Ambulatory Visit: Payer: Self-pay

## 2024-01-08 ENCOUNTER — Ambulatory Visit: Admitting: Family Medicine

## 2024-01-08 NOTE — Telephone Encounter (Signed)
" °  FYI Only or Action Required?: FYI only for provider: appointment scheduled on 01/09/24.  Patient was last seen in primary care on 11/21/2023 by [name], MD.  Called Nurse Triage reporting Cough and Otalgia.  Symptoms began 4 days ago.  Interventions attempted: OTC medications: tylenol .  Symptoms are: stable.  Triage Disposition: See Physician Within 24 Hours  Patient/caregiver understands and will follow disposition?: Yes  Copied from CRM #[id number]. Topic: Clinical - Red Word Triage >> Jan 08, 2024 10:05 AM Rea BROCKS wrote: Red Word that prompted transfer to Nurse Triage: Chest pain, ear pain, congestion, and a lot of mucus. Reason for Disposition  Earache is present  Answer Assessment - Initial Assessment Questions 1. ONSET: When did the cough begin?      4 days 2. SEVERITY: How bad is the cough today?      Persistent 3. SPUTUM: Describe the color of your sputum (e.g., none, dry cough; clear, white, yellow, green)     unknown 4. HEMOPTYSIS: Are you coughing up any blood? If Yes, ask: How much? (e.g., flecks, streaks, tablespoons, etc.)     unknown 5. DIFFICULTY BREATHING: Are you having difficulty breathing? If Yes, ask: How bad is it? (e.g., mild, moderate, severe)      denies 6. FEVER: Do you have a fever? If Yes, ask: What is your temperature, how was it measured, and when did it start?     No thermometer 7. CARDIAC HISTORY: Do you have any history of heart disease? (e.g., heart attack, congestive heart failure)      CHF 8. LUNG HISTORY: Do you have any history of lung disease?  (e.g., pulmonary embolus, asthma, emphysema)      9. PE RISK FACTORS: Do you have a history of blood clots? (or: recent major surgery, recent prolonged travel, bedridden)      10. OTHER SYMPTOMS: Do you have any other symptoms? (e.g., runny nose, wheezing, chest pain)       Chest pain with cough, bilat ear pain  Protocols used: Cough - Acute Non-Productive-A-AH  "

## 2024-01-09 ENCOUNTER — Ambulatory Visit: Admitting: Internal Medicine

## 2024-01-09 ENCOUNTER — Ambulatory Visit (HOSPITAL_COMMUNITY)

## 2024-01-15 ENCOUNTER — Inpatient Hospital Stay (HOSPITAL_BASED_OUTPATIENT_CLINIC_OR_DEPARTMENT_OTHER): Admitting: Internal Medicine

## 2024-01-15 ENCOUNTER — Inpatient Hospital Stay

## 2024-01-15 ENCOUNTER — Ambulatory Visit: Payer: Self-pay

## 2024-01-15 VITALS — BP 116/72 | HR 72 | Temp 98.6°F | Resp 17 | Ht 66.0 in | Wt 168.0 lb

## 2024-01-15 DIAGNOSIS — C3411 Malignant neoplasm of upper lobe, right bronchus or lung: Secondary | ICD-10-CM

## 2024-01-15 DIAGNOSIS — Z5112 Encounter for antineoplastic immunotherapy: Secondary | ICD-10-CM | POA: Diagnosis not present

## 2024-01-15 LAB — CBC WITH DIFFERENTIAL (CANCER CENTER ONLY)
Abs Immature Granulocytes: 0.02 K/uL (ref 0.00–0.07)
Basophils Absolute: 0 K/uL (ref 0.0–0.1)
Basophils Relative: 0 %
Eosinophils Absolute: 0.1 K/uL (ref 0.0–0.5)
Eosinophils Relative: 2 %
HCT: 37.6 % (ref 36.0–46.0)
Hemoglobin: 12.2 g/dL (ref 12.0–15.0)
Immature Granulocytes: 0 %
Lymphocytes Relative: 19 %
Lymphs Abs: 1 K/uL (ref 0.7–4.0)
MCH: 29.4 pg (ref 26.0–34.0)
MCHC: 32.4 g/dL (ref 30.0–36.0)
MCV: 90.6 fL (ref 80.0–100.0)
Monocytes Absolute: 0.5 K/uL (ref 0.1–1.0)
Monocytes Relative: 8 %
Neutro Abs: 3.9 K/uL (ref 1.7–7.7)
Neutrophils Relative %: 71 %
Platelet Count: 276 K/uL (ref 150–400)
RBC: 4.15 MIL/uL (ref 3.87–5.11)
RDW: 13.7 % (ref 11.5–15.5)
WBC Count: 5.5 K/uL (ref 4.0–10.5)
nRBC: 0 % (ref 0.0–0.2)

## 2024-01-15 LAB — CMP (CANCER CENTER ONLY)
ALT: <5 U/L (ref 0–44)
AST: 14 U/L — ABNORMAL LOW (ref 15–41)
Albumin: 3.4 g/dL — ABNORMAL LOW (ref 3.5–5.0)
Alkaline Phosphatase: 109 U/L (ref 38–126)
Anion gap: 9 (ref 5–15)
BUN: <5 mg/dL — ABNORMAL LOW (ref 8–23)
CO2: 29 mmol/L (ref 22–32)
Calcium: 8.3 mg/dL — ABNORMAL LOW (ref 8.9–10.3)
Chloride: 105 mmol/L (ref 98–111)
Creatinine: 0.59 mg/dL (ref 0.44–1.00)
GFR, Estimated: >60 mL/min
Glucose, Bld: 88 mg/dL (ref 70–99)
Potassium: 3.5 mmol/L (ref 3.5–5.1)
Sodium: 142 mmol/L (ref 135–145)
Total Bilirubin: 0.5 mg/dL (ref 0.0–1.2)
Total Protein: 6.9 g/dL (ref 6.5–8.1)

## 2024-01-15 MED ORDER — SODIUM CHLORIDE 0.9 % IV SOLN: INTRAVENOUS | Status: DC

## 2024-01-15 MED ORDER — SODIUM CHLORIDE 0.9 % IV SOLN
1500.0000 mg | Freq: Once | INTRAVENOUS | Status: AC
  Administered 2024-01-15: 1500 mg via INTRAVENOUS
  Filled 2024-01-15: qty 30

## 2024-01-15 NOTE — Patient Instructions (Signed)
 [facility] - A DEPT OF [facility]  Discharge Instructions: Thank you for choosing [facility] to provide your oncology and hematology care.   If you have a lab appointment with the Cancer Center, please go directly to the Cancer Center and check in at the registration area.   Wear comfortable clothing and clothing appropriate for easy access to any Portacath or PICC line.   We strive to give you quality time with your provider. You may need to reschedule your appointment if you arrive late (15 or more minutes).  Arriving late affects you and other patients whose appointments are after yours.  Also, if you miss three or more appointments without notifying the office, you may be dismissed from the clinic at the provider's discretion.      For prescription refill requests, have your pharmacy contact our office and allow 72 hours for refills to be completed.    Today you received the following chemotherapy and/or immunotherapy agents: durvalumab (IMFINZI)       To help prevent nausea and vomiting after your treatment, we encourage you to take your nausea medication as directed.  BELOW ARE SYMPTOMS THAT SHOULD BE REPORTED IMMEDIATELY: *FEVER GREATER THAN 100.4 F (38 C) OR HIGHER *CHILLS OR SWEATING *NAUSEA AND VOMITING THAT IS NOT CONTROLLED WITH YOUR NAUSEA MEDICATION *UNUSUAL SHORTNESS OF BREATH *UNUSUAL BRUISING OR BLEEDING *URINARY PROBLEMS (pain or burning when urinating, or frequent urination) *BOWEL PROBLEMS (unusual diarrhea, constipation, pain near the anus) TENDERNESS IN MOUTH AND THROAT WITH OR WITHOUT PRESENCE OF ULCERS (sore throat, sores in mouth, or a toothache) UNUSUAL RASH, SWELLING OR PAIN  UNUSUAL VAGINAL DISCHARGE OR ITCHING   Items with * indicate a potential emergency and should be followed up as soon as possible or go to the Emergency Department if any problems should occur.  Please show the CHEMOTHERAPY ALERT CARD or  IMMUNOTHERAPY ALERT CARD at check-in to the Emergency Department and triage nurse.  Should you have questions after your visit or need to cancel or reschedule your appointment, please contact [facility] - A DEPT OF [facility]  Dept: [phone]  and follow the prompts.  Office hours are 8:00 a.m. to 4:30 p.m. Monday - Friday. Please note that voicemails left after 4:00 p.m. may not be returned until the following business day.  We are closed weekends and major holidays. You have access to a nurse at all times for urgent questions. Please call the main number to the clinic Dept: [phone] and follow the prompts.   For any non-urgent questions, you may also contact your provider using MyChart. We now offer e-Visits for anyone 39 and older to request care online for non-urgent symptoms. For details visit [email or web address].   Also download the MyChart app! Go to the app store, search "MyChart", open the app, select [place], and log in with your MyChart username and password.

## 2024-01-15 NOTE — Telephone Encounter (Signed)
 FYI Only or Action Required?: FYI only for provider: appointment scheduled on 01.02.26.  Patient was last seen in primary care on 11/21/2023 by [name], MD.  Called Nurse Triage reporting Sore Throat.  Symptoms began a week ago.  Interventions attempted: OTC medications: Advil .  Symptoms are: gradually worsening.  Triage Disposition: See Physician Within 24 Hours  Patient/caregiver understands and will follow disposition?: Yes    Copied from CRM #[id number]. Topic: Clinical - Red Word Triage >> Jan 15, 2024  4:06 PM Rea ORN wrote: Red Word that prompted transfer to Nurse Triage: Throat and Ear pain, increased mucus since last week Reason for Disposition  Earache also present  Answer Assessment - Initial Assessment Questions 1. ONSET: When did the throat start hurting? (Hours or days ago)      Last week  2. SEVERITY: How bad is the sore throat? (Scale 1-10; mild, moderate or severe)      8/10  3. STREP EXPOSURE: Has there been any exposure to strep within the past week? If Yes, ask: What type of contact occurred?       denies  4.  VIRAL SYMPTOMS: pt denies runny nose, cough, hoarse voice or red eyes      Nasal discharge  5. FEVER: Do you have a fever? If Yes, ask: What is your temperature, how was it measured, and when did it start?      denies  6. PUS ON THE TONSILS: Is there pus on the tonsils in the back of your throat?      denies  7. OTHER SYMPTOMS: Pt denies difficulty breathing, headache, rash        Pt reports sore throat and ear pain both ears Pt is taking OTC Advil  Pt scheduled for a visit on 01.02.26  for further evaluation. Pt advised to try warm tea and honey as she awaits her in clinic appt Sooner appt offered, however, pt only wants to see Dr. [name] Pt agrees with plan of care, will call back for any worsening symptoms  Protocols used: Sore Throat-A-AH

## 2024-01-15 NOTE — Progress Notes (Signed)
 "     [facility] [phone]   [phone]  OFFICE PROGRESS NOTE  [name], MD [address] [place] [address]  DIAGNOSIS: Stage IIIB (T4, N2b, M0) non-small cell lung cancer, squamous cell carcinoma. She presented with large pleural-based peripheral right upper lobe lung mass measuring up to 8.2 cm in size with large right paratracheal node with low level hypermetabolic uptake.  Diagnosed in March 2025.   PDL1: 15%   PRIOR THERAPY: concurrent chemoradiation with carboplatin  for an AUC of 2 and taxol  45 mg/m2. First dose expected on 05/08/23.  Status post 4 cycles.  Last dose was given on 05/30/2023.   CURRENT THERAPY: Consolidation treatment with immunotherapy with Imfinzi  1500 Mg IV every 4 weeks.  First dose 07/03/2023.  Status post 7 cycles.  INTERVAL HISTORY: [name] 78 y.o. female returns to the clinic today for follow-up visit accompanied by her daughter.  Discussed the use of AI scribe software for clinical note transcription with the patient, who gave verbal consent to proceed.  History of Present Illness [name] is a 78 year old female with stage IIIB non-small cell lung cancer undergoing consolidation durvalumab  who presents for pre-infusion evaluation.  Diagnosed in March 2025 with stage IIIB non-small cell lung cancer (comosquamous cell carcinoma, PD-L1 15%), she completed concurrent chemoradiation with weekly carboplatin  and paclitaxel  and is currently receiving durvalumab  every four weeks. She has completed seven cycles, with the eighth cycle scheduled today. Post-chemoradiation imaging demonstrated tumor shrinkage, but persistent mediastinal lymphadenopathy. Her most recent imaging was in September 2025, with the next scan scheduled for January 5.  She reports persistent fatigue as her primary symptom. She also describes new right breast pain and is scheduled for a mammogram on January 7 for further evaluation. She  denies other new symptoms.  She inquired about her cancer stage. She denies alcohol use.    MEDICAL HISTORY: Past Medical History:  Diagnosis Date   AAA (abdominal aortic aneurysm)    a. s/p stent graft repair 01/2016.   Acute ischemic colitis 07/29/2010   Diagnosed June, 2012 characterized by acute lower GI bleeding, spontaneously resolved.    Acute respiratory infection 06/09/2014   Acute sinus infection 05/14/2015   Allergic rhinitis, cause unspecified    Angioedema of lips 06/03/2014   Anxiety state, unspecified    Arthritis of left hip 04/16/2015   Bilateral hearing loss 07/19/2017   Chronic LBP    Coronary artery disease    a. inferior MI 1998 s/p PCI of RCA. b. stenting of Cx 04/2010. c. DES to prox LAD 05/2013. d. DES x 3 in 10/2015. // Myoview  07/2018:  EF 59, normal perfusion, low risk    Coronary artery disease involving native heart without angina pectoris 07/31/2006   Qualifier: Diagnosis of  By: Harlow MD, Ozell BRAVO  ANGIOGRAPHIC DATA:  1. Ventriculography done in the RAO projection reveals a small wall  motion abnormality in the mid inferior wall. Ejection fraction  would be estimated at around 50%.  2. The right coronary artery has some progressive disease of about 60-  80% in the proximal mid segment. The distal vessel appears to be  50-70% narrowing althou   Degeneration of lumbar or lumbosacral intervertebral disc    Depressive disorder, not elsewhere classified    Diabetes (HCC) 11/03/2006   Qualifier: Diagnosis of  By: [name] MD, Lynwood ORN    Diabetes mellitus    TYPE II   Frequent urination 05/08/2014  Gout 08/22/2013   History of endovascular stent graft for abdominal aortic aneurysm (AAA) 01/22/2016   History of radiation therapy    Right lung-04/19/23-05/29/23-Dr. [name]   Hyperlipidemia    Hypertension    Hypothyroidism    Iron  deficiency anemia 06/13/2017   Lower GI bleed 06/2010   Diverticular bleed   Lumbar radiculopathy 05/15/2015   MENOPAUSAL  DISORDER 10/29/2007   Qualifier: Diagnosis of  By: [name] MD, Lynwood ORN    Myalgia 09/25/2013   Noncompliance with medications 02/26/2014   Obesity, unspecified    OTITIS MEDIA, SEROUS, CHRONIC 04/23/2007   Qualifier: Diagnosis of  By: Harlow MD, Ozell BRAVO    Thoracic disc disease 11/19/2018   URTICARIA 09/23/2009   Qualifier: Diagnosis of  By: Inocencio MD, Berwyn A    Venous insufficiency 07/19/2017    ALLERGIES:  is allergic to crestor  [rosuvastatin  calcium ], prilosec [omeprazole ], miconazole nitrate, augmentin  [amoxicillin -pot clavulanate], and doxycycline .  MEDICATIONS:  Current Outpatient Medications  Medication Sig Dispense Refill   alendronate  (FOSAMAX ) 70 MG tablet Take 1 tablet (70 mg total) by mouth every 7 (seven) days. Take with a full glass of water on an empty stomach. 12 tablet 3   aspirin  EC 81 MG tablet Take 1 tablet (81 mg total) by mouth daily. 100 tablet 3   atenolol  (TENORMIN ) 25 MG tablet TAKE 1/2 TABLET(12.5 MG TOTAL) BY MOUTH DAILY AT 9:00 AM. THIS IS A DECREASE IN DOSE 45 tablet 3   Blood Pressure Monitoring (OMRON 3 SERIES BP MONITOR) DEVI Use as directed. 1 each 0   Foot Care Products ([employer] GEL HEEL CUSHION WOMENS) PADS Use as directed at bedtime 2 each 5   gabapentin  (NEURONTIN ) 300 MG capsule Take 1-2 tab by mouth at bedtime for pain and sleep 180 capsule 1   lidocaine -prilocaine  (EMLA ) cream Apply 1 Application topically as needed. 30 g 2   Oxycodone  HCl 10 MG TABS Take 1 tablet (10 mg total) by mouth every 4 (four) hours as needed. 30 tablet 0   polyethylene glycol powder (GLYCOLAX /MIRALAX ) 17 GM/SCOOP powder Take 17 g by mouth 2 (two) times daily as needed. 3350 g 1   predniSONE  (DELTASONE ) 10 MG tablet 3 tabs by mouth per day for 3 days,2tabs per day for 3 days,1tab per day for 3 days 18 tablet 0   prochlorperazine  (COMPAZINE ) 10 MG tablet Take 1 tablet (10 mg total) by mouth every 6 (six) hours as needed. 30 tablet 2   SYNTHROID  150 MCG tablet Take 1 tablet  (150 mcg total) by mouth daily before breakfast. 90 tablet 3   temazepam  (RESTORIL ) 15 MG capsule Take 1 capsule (15 mg total) by mouth at bedtime as needed for sleep. 30 capsule 5   Triamcinolone  Acetonide (ZILRETTA ) 32 MG SRER intra-articular injection 5 mL, intra-articular, bilat knees 2 each 0   No current facility-administered medications for this visit.    SURGICAL HISTORY:  Past Surgical History:  Procedure Laterality Date   CARDIAC CATHETERIZATION     PCI OF BOTH THE CIRCUMFLEX AND LEFT ANTERIOR DESCENDING ARTERY   CARDIAC CATHETERIZATION N/A 10/29/2015   Procedure: Coronary Stent Intervention;  Surgeon: Lonni Hanson, MD;  Location: [facility];  Service: Cardiovascular;  Laterality: N/A;   CARDIAC CATHETERIZATION N/A 10/29/2015   Procedure: Coronary/Graft Angiography;  Surgeon: Lonni Hanson, MD;  Location: [facility];  Service: Cardiovascular;  Laterality: N/A;   CARDIAC CATHETERIZATION N/A 10/29/2015   Procedure: Intravascular Pressure Wire/FFR Study;  Surgeon: Lonni Hanson, MD;  Location: [facility];  Service: Cardiovascular;  Laterality: N/A;   CESAREAN SECTION     ENDOVASCULAR STENT INSERTION N/A 01/22/2016   Procedure: ABDOMINAL AORTIC ENDOVASCULAR STENT GRAFT INSERTION;  Surgeon: Krystal JULIANNA Doing, MD;  Location: [facility];  Service: Vascular;  Laterality: N/A;   HEART STENT  04-2010  and  Jun 07, 2013   X 3   IR EMBO ARTERIAL NOT HEMORR HEMANG INC GUIDE ROADMAPPING  07/17/2023   IR IMAGING GUIDED PORT INSERTION  05/17/2023   IR RADIOLOGIST EVAL & MGMT  06/21/2023   LEFT HEART CATH AND CORONARY ANGIOGRAPHY N/A 04/03/2019   Procedure: LEFT HEART CATH AND CORONARY ANGIOGRAPHY;  Surgeon: Mady Bruckner, MD;  Location: [facility];  Service: Cardiovascular;  Laterality: N/A;   LEFT HEART CATHETERIZATION WITH CORONARY ANGIOGRAM N/A 06/07/2013   Procedure: LEFT HEART CATHETERIZATION WITH CORONARY ANGIOGRAM;  Surgeon: Bruckner JONETTA Cash, MD;  Location:  [facility] CATH LAB;  Service: Cardiovascular;  Laterality: N/A;   LEFT HEART CATHETERIZATION WITH CORONARY ANGIOGRAM N/A 02/25/2014   Procedure: LEFT HEART CATHETERIZATION WITH CORONARY ANGIOGRAM;  Surgeon: Debby DELENA Sor, MD;  Location: [facility];  Service: Cardiovascular;  Laterality: N/A;   LUMBAR FUSION  01/2007   DR. GUST...3-LEVEL WITH FIXATION   OOPHORECTOMY     BSO? pt.unsure   PARATHYROIDECTOMY     SPINE SURGERY     THYROIDECTOMY     TOTAL ABDOMINAL HYSTERECTOMY      REVIEW OF SYSTEMS:  A comprehensive review of systems was negative except for: Constitutional: positive for fatigue Musculoskeletal: positive for muscle weakness   PHYSICAL EXAMINATION: General appearance: alert, cooperative, fatigued, and no distress Head: Normocephalic, without obvious abnormality, atraumatic Neck: no adenopathy, no JVD, supple, symmetrical, trachea midline, and thyroid  not enlarged, symmetric, no tenderness/mass/nodules Lymph nodes: Cervical, supraclavicular, and axillary nodes normal. Resp: clear to auscultation bilaterally Back: symmetric, no curvature. ROM normal. No CVA tenderness. Cardio: regular rate and rhythm, S1, S2 normal, no murmur, click, rub or gallop GI: soft, non-tender; bowel sounds normal; no masses,  no organomegaly Extremities: extremities normal, atraumatic, no cyanosis or edema  ECOG PERFORMANCE STATUS: 1 - Symptomatic but completely ambulatory  Blood pressure 116/72, pulse 72, temperature 98.6 F (37 C), temperature source Temporal, resp. rate 17, height 5' 6 (1.676 m), weight 168 lb (76.2 kg), SpO2 96%.  LABORATORY DATA: Lab Results  Component Value Date   WBC 5.5 01/15/2024   HGB 12.2 01/15/2024   HCT 37.6 01/15/2024   MCV 90.6 01/15/2024   PLT 276 01/15/2024      Chemistry      Component Value Date/Time   NA 142 12/19/2023 1127   NA 141 06/08/2021 1606   K 3.9 12/19/2023 1127   CL 105 12/19/2023 1127   CO2 29 12/19/2023 1127   BUN 8 12/19/2023 1127   BUN 4  (L) 06/08/2021 1606   CREATININE 0.55 12/19/2023 1127      Component Value Date/Time   CALCIUM  8.7 (L) 12/19/2023 1127   ALKPHOS 117 12/19/2023 1127   AST 19 12/19/2023 1127   ALT 5 12/19/2023 1127   BILITOT 0.5 12/19/2023 1127       RADIOGRAPHIC STUDIES: No results found.   ASSESSMENT AND PLAN: This is a very pleasant 78 years old African-American female with Stage IIIB (T4, N2b, M0) non-small cell lung cancer, squamous cell carcinoma. She presented with large pleural-based peripheral right upper lobe lung mass measuring up to 8.2 cm in size with large right paratracheal  node with low level hypermetabolic uptake. She underwent a course of concurrent chemoradiation with weekly carboplatin  for AUC of 2 and paclitaxel  45 Mg/M2.  Status post 4 cycles. She is currently undergoing consolidation treatment with immunotherapy with Imfinzi  1500 Mg IV every 4 weeks status post 7 cycles. She is tolerating her treatment fairly well with no significant adverse effects. Assessment and Plan Assessment & Plan Stage 3B non-small cell lung cancer Undergoing consolidation immunotherapy with durvalumab  following prior chemoradiation, with prior tumor shrinkage but persistent mediastinal lymphadenopathy. Currently experiencing fatigue and new right breast discomfort. Primary goal is prevention of recurrence or progression. - Encouraged increased physical activity to address fatigue and prevent deconditioning. - Scheduled mammogram for January 7 to evaluate right breast discomfort. - Ordered surveillance imaging for January 5 to assess disease status. - Plan to review imaging results to guide further management if progression is detected. - After completion of 13 durvalumab  cycles, will continue surveillance and observe unless progression occurs. She was advised to call immediately if she has any concerning symptoms in the interval. The patient voices understanding of current disease status and treatment  options and is in agreement with the current care plan.  All questions were answered. The patient knows to call the clinic with any problems, questions or concerns. We can certainly see the patient much sooner if necessary. The total time spent in the appointment was 20 minutes including review of chart and various tests results, discussions about plan of care and coordination of care plan .   Disclaimer: This note was dictated with voice recognition software. Similar sounding words can inadvertently be transcribed and may not be corrected upon review.        "

## 2024-01-19 ENCOUNTER — Ambulatory Visit: Admitting: Internal Medicine

## 2024-01-19 ENCOUNTER — Encounter: Payer: Self-pay | Admitting: Internal Medicine

## 2024-01-19 VITALS — BP 120/68 | HR 78 | Temp 99.1°F | Ht 66.0 in | Wt 179.0 lb

## 2024-01-19 DIAGNOSIS — E119 Type 2 diabetes mellitus without complications: Secondary | ICD-10-CM

## 2024-01-19 DIAGNOSIS — I10 Essential (primary) hypertension: Secondary | ICD-10-CM

## 2024-01-19 DIAGNOSIS — E1165 Type 2 diabetes mellitus with hyperglycemia: Secondary | ICD-10-CM

## 2024-01-19 DIAGNOSIS — J029 Acute pharyngitis, unspecified: Secondary | ICD-10-CM | POA: Diagnosis not present

## 2024-01-19 DIAGNOSIS — E559 Vitamin D deficiency, unspecified: Secondary | ICD-10-CM | POA: Diagnosis not present

## 2024-01-19 DIAGNOSIS — J019 Acute sinusitis, unspecified: Secondary | ICD-10-CM

## 2024-01-19 MED ORDER — LEVOFLOXACIN 500 MG PO TABS: 500.0000 mg | ORAL_TABLET | Freq: Every day | ORAL | 0 refills | Status: DC

## 2024-01-19 MED ORDER — KETOROLAC TROMETHAMINE 30 MG/ML IJ SOLN
30.0000 mg | Freq: Once | INTRAMUSCULAR | Status: AC
  Administered 2024-01-19: 30 mg via INTRAMUSCULAR

## 2024-01-19 NOTE — Assessment & Plan Note (Signed)
 BP Readings from Last 3 Encounters:  01/19/24 120/68  01/15/24 116/72  12/19/23 (!) 100/59   Stable, pt to continue medical treatment tenormin  12.5 qd

## 2024-01-19 NOTE — Progress Notes (Signed)
 Patient ID: [name], female   DOB: 1945/12/31, 79 y.o.   MRN: [id number]        Chief Complaint: follow up sinusitis, dm, htn, low vit d       HPI:  [name] is a 79 y.o. female  Here with 2-3 days acute onset fever, facial pain, pressure, headache, general weakness and malaise, and greenish d/c, with mild ST and cough, but pt denies chest pain, wheezing, increased sob or doe, orthopnea, PND, increased LE swelling, palpitations, dizziness or syncope.   Pt denies polydipsia, polyuria, or new focal neuro s/s.    Pt denies recent wt loss, night sweats, loss of appetite, or other constitutional symptoms        Wt Readings from Last 3 Encounters:  01/19/24 179 lb (81.2 kg)  01/15/24 168 lb (76.2 kg)  12/19/23 176 lb 8 oz (80.1 kg)   BP Readings from Last 3 Encounters:  01/19/24 120/68  01/15/24 116/72  12/19/23 (!) 100/59         Past Medical History:  Diagnosis Date   AAA (abdominal aortic aneurysm)    a. s/p stent graft repair 01/2016.   Acute ischemic colitis 07/29/2010   Diagnosed June, 2012 characterized by acute lower GI bleeding, spontaneously resolved.    Acute respiratory infection 06/09/2014   Acute sinus infection 05/14/2015   Allergic rhinitis, cause unspecified    Angioedema of lips 06/03/2014   Anxiety state, unspecified    Arthritis of left hip 04/16/2015   Bilateral hearing loss 07/19/2017   Chronic LBP    Coronary artery disease    a. inferior MI 1998 s/p PCI of RCA. b. stenting of Cx 04/2010. c. DES to prox LAD 05/2013. d. DES x 3 in 10/2015. // Myoview  07/2018:  EF 59, normal perfusion, low risk    Coronary artery disease involving native heart without angina pectoris 07/31/2006   Qualifier: Diagnosis of  By: Harlow MD, Ozell BRAVO  ANGIOGRAPHIC DATA:  1. Ventriculography done in the RAO projection reveals a small wall  motion abnormality in the mid inferior wall. Ejection fraction  would be estimated at around 50%.  2. The right coronary artery has some  progressive disease of about 60-  80% in the proximal mid segment. The distal vessel appears to be  50-70% narrowing althou   Degeneration of lumbar or lumbosacral intervertebral disc    Depressive disorder, not elsewhere classified    Diabetes (HCC) 11/03/2006   Qualifier: Diagnosis of  By: [name] MD, Lynwood ORN    Diabetes mellitus    TYPE II   Frequent urination 05/08/2014   Gout 08/22/2013   History of endovascular stent graft for abdominal aortic aneurysm (AAA) 01/22/2016   History of radiation therapy    Right lung-04/19/23-05/29/23-Dr. [name]   Hyperlipidemia    Hypertension    Hypothyroidism    Iron  deficiency anemia 06/13/2017   Lower GI bleed 06/2010   Diverticular bleed   Lumbar radiculopathy 05/15/2015   MENOPAUSAL DISORDER 10/29/2007   Qualifier: Diagnosis of  By: [name] MD, Lynwood ORN    Myalgia 09/25/2013   Noncompliance with medications 02/26/2014   Obesity, unspecified    OTITIS MEDIA, SEROUS, CHRONIC 04/23/2007   Qualifier: Diagnosis of  By: Harlow MD, Ozell BRAVO    Thoracic disc disease 11/19/2018   URTICARIA 09/23/2009   Qualifier: Diagnosis of  By: Inocencio MD, Valerie A    Venous insufficiency 07/19/2017   Past Surgical History:  Procedure Laterality Date  CARDIAC CATHETERIZATION     PCI OF BOTH THE CIRCUMFLEX AND LEFT ANTERIOR DESCENDING ARTERY   CARDIAC CATHETERIZATION N/A 10/29/2015   Procedure: Coronary Stent Intervention;  Surgeon: Lonni Hanson, MD;  Location: [facility] INVASIVE CV LAB;  Service: Cardiovascular;  Laterality: N/A;   CARDIAC CATHETERIZATION N/A 10/29/2015   Procedure: Coronary/Graft Angiography;  Surgeon: Lonni Hanson, MD;  Location: [facility] INVASIVE CV LAB;  Service: Cardiovascular;  Laterality: N/A;   CARDIAC CATHETERIZATION N/A 10/29/2015   Procedure: Intravascular Pressure Wire/FFR Study;  Surgeon: Lonni Hanson, MD;  Location: [facility] INVASIVE CV LAB;  Service: Cardiovascular;  Laterality: N/A;   CESAREAN SECTION     ENDOVASCULAR STENT  INSERTION N/A 01/22/2016   Procedure: ABDOMINAL AORTIC ENDOVASCULAR STENT GRAFT INSERTION;  Surgeon: Krystal JULIANNA Doing, MD;  Location: [facility] OR;  Service: Vascular;  Laterality: N/A;   HEART STENT  04-2010  and  Jun 07, 2013   X 3   IR EMBO ARTERIAL NOT HEMORR HEMANG INC GUIDE ROADMAPPING  07/17/2023   IR IMAGING GUIDED PORT INSERTION  05/17/2023   IR RADIOLOGIST EVAL & MGMT  06/21/2023   LEFT HEART CATH AND CORONARY ANGIOGRAPHY N/A 04/03/2019   Procedure: LEFT HEART CATH AND CORONARY ANGIOGRAPHY;  Surgeon: Hanson Lonni, MD;  Location: [facility] INVASIVE CV LAB;  Service: Cardiovascular;  Laterality: N/A;   LEFT HEART CATHETERIZATION WITH CORONARY ANGIOGRAM N/A 06/07/2013   Procedure: LEFT HEART CATHETERIZATION WITH CORONARY ANGIOGRAM;  Surgeon: Lonni JONETTA Cash, MD;  Location: [facility] CATH LAB;  Service: Cardiovascular;  Laterality: N/A;   LEFT HEART CATHETERIZATION WITH CORONARY ANGIOGRAM N/A 02/25/2014   Procedure: LEFT HEART CATHETERIZATION WITH CORONARY ANGIOGRAM;  Surgeon: Debby DELENA Sor, MD;  Location: [facility] CATH LAB;  Service: Cardiovascular;  Laterality: N/A;   LUMBAR FUSION  01/2007   DR. GUST...3-LEVEL WITH FIXATION   OOPHORECTOMY     BSO? pt.unsure   PARATHYROIDECTOMY     SPINE SURGERY     THYROIDECTOMY     TOTAL ABDOMINAL HYSTERECTOMY      reports that she quit smoking about 37 years ago. Her smoking use included cigarettes. She started smoking about 67 years ago. She has never used smokeless tobacco. She reports that she does not drink alcohol and does not use drugs. family history includes Arthritis in her maternal aunt; Breast cancer in her maternal aunt; Diabetes in her brother and maternal aunt; Heart attack (age of onset: 4) in her mother; Heart attack (age of onset: 15) in her father; Heart disease in her father and mother. Allergies[1] Medications Ordered Prior to Encounter[2]      ROS:  All others reviewed and negative.  Objective        PE:  BP 120/68 (BP Location: Left Arm, Patient  Position: Sitting, Cuff Size: Normal)   Pulse 78   Temp 99.1 F (37.3 C) (Oral)   Ht 5' 6 (1.676 m)   Wt 179 lb (81.2 kg)   SpO2 98%   BMI 28.89 kg/m                 Constitutional: Pt appears mild ill               HENT: Head: NCAT.                Right Ear: External ear normal.                 Left Ear: External ear normal. Bilat tm's with mild erythema.  Max sinus areas mild tender.  Pharynx with  mild erythema, no exudate               Eyes: . Pupils are equal, round, and reactive to light. Conjunctivae and EOM are normal               Nose: without d/c or deformity               Neck: Neck supple. Gross normal ROM               Cardiovascular: Normal rate and regular rhythm.                 Pulmonary/Chest: Effort normal and breath sounds without rales or wheezing.                Abd:  Soft, NT, ND, + BS, no organomegaly               Neurological: Pt is alert. At baseline orientation, motor grossly intact               Skin: Skin is warm. No rashes, no other new lesions, LE edema - none               Psychiatric: Pt behavior is normal without agitation   Micro: none  Cardiac tracings I have personally interpreted today:  none  Pertinent Radiological findings (summarize): none   Lab Results  Component Value Date   WBC 5.5 01/15/2024   HGB 12.2 01/15/2024   HCT 37.6 01/15/2024   PLT 276 01/15/2024   GLUCOSE 88 01/15/2024   CHOL 161 11/21/2023   TRIG 97.0 11/21/2023   HDL 34.90 (L) 11/21/2023   LDLDIRECT 131.0 03/27/2015   LDLCALC 106 (H) 11/21/2023   ALT <5 01/15/2024   AST 14 (L) 01/15/2024   NA 142 01/15/2024   K 3.5 01/15/2024   CL 105 01/15/2024   CREATININE 0.59 01/15/2024   BUN <5 (L) 01/15/2024   CO2 29 01/15/2024   TSH 22.700 (H) 11/20/2023   INR 1.2 03/27/2023   HGBA1C 6.0 11/21/2023   MICROALBUR 2.0 (H) 11/06/2009   Assessment/Plan:  Christe TERIA KHACHATRYAN is a 79 y.o. Black or African American [2] female with  has a past medical history of AAA (abdominal  aortic aneurysm), Acute ischemic colitis (07/29/2010), Acute respiratory infection (06/09/2014), Acute sinus infection (05/14/2015), Allergic rhinitis, cause unspecified, Angioedema of lips (06/03/2014), Anxiety state, unspecified, Arthritis of left hip (04/16/2015), Bilateral hearing loss (07/19/2017), Chronic LBP, Coronary artery disease, Coronary artery disease involving native heart without angina pectoris (07/31/2006), Degeneration of lumbar or lumbosacral intervertebral disc, Depressive disorder, not elsewhere classified, Diabetes (HCC) (11/03/2006), Diabetes mellitus, Frequent urination (05/08/2014), Gout (08/22/2013), History of endovascular stent graft for abdominal aortic aneurysm (AAA) (01/22/2016), History of radiation therapy, Hyperlipidemia, Hypertension, Hypothyroidism, Iron  deficiency anemia (06/13/2017), Lower GI bleed (06/2010), Lumbar radiculopathy (05/15/2015), MENOPAUSAL DISORDER (10/29/2007), Myalgia (09/25/2013), Noncompliance with medications (02/26/2014), Obesity, unspecified, OTITIS MEDIA, SEROUS, CHRONIC (04/23/2007), Thoracic disc disease (11/19/2018), URTICARIA (09/23/2009), and Venous insufficiency (07/19/2017).  Acute sinus infection Mild to mod, for antibx course, levaquin  500 gm every day, toradol  30 mg IM,  to f/u any worsening symptoms or concerns  Vitamin D  deficiency Last vitamin D  Lab Results  Component Value Date   VD25OH <7.00 (L) 11/21/2023   Low, to start oral replacement   Essential hypertension BP Readings from Last 3 Encounters:  01/19/24 120/68  01/15/24 116/72  12/19/23 (!) 100/59   Stable, pt to continue medical treatment tenormin  12.5 qd   Diabetes (  HCC) Lab Results  Component Value Date   HGBA1C 6.0 11/21/2023   Stable, pt to continue current medical treatment  - diet,wt control  Followup: Return in about 6 months (around 07/18/2024).  Lynwood Rush, MD 01/19/2024 8:29 PM  Medical Group  Primary Care - [facility]     [1]  Allergies Allergen Reactions   Crestor  [Rosuvastatin  Calcium ] Other (See Comments)    Feeling poor   Prilosec [Omeprazole ] Other (See Comments)    Chest pain   Miconazole Nitrate Hives    REACTION: hives   Augmentin  [Amoxicillin -Pot Clavulanate] Hives, Itching and Rash    Has patient had a PCN reaction causing immediate rash, facial/tongue/throat swelling, SOB or lightheadedness with hypotension:unsure Has patient had a PCN reaction causing severe rash involving mucus membranes or skin necrosis:unsure Has patient had a PCN reaction that required hospitalization:No Has patient had a PCN reaction occurring within the last 10 years:NO If all of the above answers are NO, then may proceed with Cephalosporin use. Has patient had a PCN reaction causing immediate rash, facial/tongue/throat swelling, SOB or lightheadedness with hypotension:unsure Has patient had a PCN reaction causing severe rash involving mucus membranes or skin necrosis:unsure Has patient had a PCN reaction that required hospitalization:No Has patient had a PCN reaction occurring within the last 10 years:NO If all of the above answers are NO, then may proceed with Cephalosporin use.    Doxycycline  Other (See Comments)    REACTION: gi upset  [2]  Current Outpatient Medications on File Prior to Visit  Medication Sig Dispense Refill   alendronate  (FOSAMAX ) 70 MG tablet Take 1 tablet (70 mg total) by mouth every 7 (seven) days. Take with a full glass of water on an empty stomach. 12 tablet 3   aspirin  EC 81 MG tablet Take 1 tablet (81 mg total) by mouth daily. 100 tablet 3   atenolol  (TENORMIN ) 25 MG tablet TAKE 1/2 TABLET(12.5 MG TOTAL) BY MOUTH DAILY AT 9:00 AM. THIS IS A DECREASE IN DOSE 45 tablet 3   Blood Pressure Monitoring (OMRON 3 SERIES BP MONITOR) DEVI Use as directed. 1 each 0   Foot Care Products ([employer] GEL HEEL CUSHION WOMENS) PADS Use as directed at bedtime 2 each 5    gabapentin  (NEURONTIN ) 300 MG capsule Take 1-2 tab by mouth at bedtime for pain and sleep 180 capsule 1   lidocaine -prilocaine  (EMLA ) cream Apply 1 Application topically as needed. 30 g 2   Oxycodone  HCl 10 MG TABS Take 1 tablet (10 mg total) by mouth every 4 (four) hours as needed. 30 tablet 0   polyethylene glycol powder (GLYCOLAX /MIRALAX ) 17 GM/SCOOP powder Take 17 g by mouth 2 (two) times daily as needed. 3350 g 1   predniSONE  (DELTASONE ) 10 MG tablet 3 tabs by mouth per day for 3 days,2tabs per day for 3 days,1tab per day for 3 days 18 tablet 0   prochlorperazine  (COMPAZINE ) 10 MG tablet Take 1 tablet (10 mg total) by mouth every 6 (six) hours as needed. 30 tablet 2   SYNTHROID  150 MCG tablet Take 1 tablet (150 mcg total) by mouth daily before breakfast. 90 tablet 3   temazepam  (RESTORIL ) 15 MG capsule Take 1 capsule (15 mg total) by mouth at bedtime as needed for sleep. 30 capsule 5   Triamcinolone  Acetonide (ZILRETTA ) 32 MG SRER intra-articular injection 5 mL, intra-articular, bilat knees 2 each 0   No current facility-administered medications on file prior to visit.

## 2024-01-19 NOTE — Assessment & Plan Note (Signed)
 Lab Results  Component Value Date   HGBA1C 6.0 11/21/2023   Stable, pt to continue current medical treatment  - diet,wt control

## 2024-01-19 NOTE — Patient Instructions (Signed)
 You had the Toradol  30 mg pain shot today  Please take all new medication as prescribed - the antibiotic  Please continue all other medications as before, and refills have been done if requested.  Please have the pharmacy call with any other refills you may need.  Please keep your appointments with your specialists as you may have planned  Please make an Appointment to return in 6 months, or sooner if needed

## 2024-01-19 NOTE — Assessment & Plan Note (Signed)
 Last vitamin D  Lab Results  Component Value Date   VD25OH <7.00 (L) 11/21/2023   Low, to start oral replacement

## 2024-01-19 NOTE — Assessment & Plan Note (Signed)
 Mild to mod, for antibx course, levaquin  500 gm every day, toradol  30 mg IM,  to f/u any worsening symptoms or concerns

## 2024-01-22 ENCOUNTER — Ambulatory Visit (HOSPITAL_COMMUNITY)
Admission: RE | Admit: 2024-01-22 | Discharge: 2024-01-22 | Disposition: A | Discharge status: Home / Self Care | Source: Ambulatory Visit | Attending: Physician Assistant | Admitting: Physician Assistant

## 2024-01-22 DIAGNOSIS — C3411 Malignant neoplasm of upper lobe, right bronchus or lung: Secondary | ICD-10-CM | POA: Diagnosis present

## 2024-01-22 MED ORDER — IOHEXOL 350 MG/ML SOLN
60.0000 mL | Freq: Once | INTRAVENOUS | Status: AC | PRN
  Administered 2024-01-22: 60 mL via INTRAVENOUS

## 2024-01-24 ENCOUNTER — Encounter: Payer: Self-pay | Admitting: Internal Medicine

## 2024-01-24 ENCOUNTER — Ambulatory Visit
Admission: RE | Admit: 2024-01-24 | Discharge: 2024-01-24 | Disposition: A | Discharge status: Home / Self Care | Source: Ambulatory Visit | Attending: Physician Assistant | Admitting: Physician Assistant

## 2024-01-24 ENCOUNTER — Telehealth: Payer: Self-pay

## 2024-01-24 DIAGNOSIS — N644 Mastodynia: Secondary | ICD-10-CM

## 2024-01-24 NOTE — Telephone Encounter (Signed)
 Copied from CRM [id number]. Topic: Clinical - Refused Triage >> Jan 24, 2024  4:24 PM Sophia H wrote: Patient/caller voiced complaints of Pain in right ear & throat. Declined transfer to triage. - only wants to speak with PCP's nurse, states injection didn't help much. # [phone] patient states she needs a call back today.   Per CAL send CRM.

## 2024-01-25 ENCOUNTER — Ambulatory Visit: Payer: Self-pay

## 2024-01-25 NOTE — Telephone Encounter (Signed)
 FYI Only or Action Required?: Action required by provider: update on patient condition.  Patient was last seen in primary care on 01/19/2024 by [name], MD.  Called Nurse Triage reporting Sore Throat.  Symptoms began a week ago.  Interventions attempted: Nothing.  Symptoms are: gradually worsening.  Triage Disposition: See Physician Within 24 Hours  Patient/caregiver understands and will follow disposition?: No, wishes to speak with PCP  Copied from CRM #[id number]. Topic: Clinical - Red Word Triage >> Jan 25, 2024  4:44 PM Jayma L wrote: Red Word that prompted transfer to Nurse Triage: ear pain and throat pain that's getting worse  , and some coughing Reason for Disposition  Earache also present  Answer Assessment - Initial Assessment Questions Patient said she was seen recently and did not get azithromyacin. Patient does not want to make appointment, requesting medication be sent in. [facility] [place]  4.  VIRAL SYMPTOMS: Are there any symptoms of a cold, such as a runny nose, cough, hoarse voice or red eyes?      Sore throat, earache, cough clear 5. FEVER: Do you have a fever? If Yes, ask: What is your temperature, how was it measured, and when did it start?     Doesn't know, doesn't think so  Protocols used: Sore Throat-A-AH

## 2024-01-26 NOTE — Telephone Encounter (Signed)
 At this point, she has only been taking levaquin  for 3-4 days.   Please let pt know that we should give it more time to improve.   Thanks

## 2024-01-26 NOTE — Telephone Encounter (Signed)
See other note, thanks 

## 2024-01-31 NOTE — Telephone Encounter (Signed)
 Please inform pt of PCP advice as follows upon her call back to the clinic At this point, she has only been taking levaquin  for 3-4 days.   Please let pt know that we should give it more time to improve.   Thanks

## 2024-02-07 NOTE — Progress Notes (Unsigned)
 [facility] OFFICE PROGRESS NOTE  [name], MD [address] [place] [address]  DIAGNOSIS:  Stage IIIB (T4, N2b, M0) non-small cell lung cancer, squamous cell carcinoma. She presented with large pleural-based peripheral right upper lobe lung mass measuring up to 8.2 cm in size with large right paratracheal node with low level hypermetabolic uptake.   PDL1: 15  PRIOR THERAPY: concurrent chemoradiation with carboplatin  for an AUC of 2 and taxol  45 mg/m2. First dose expected on 05/08/23. Status post 4 cycles. Last dose was given on 05/30/2023.   CURRENT THERAPY: Consolidation treatment with immunotherapy with Imfinzi  1500 Mg IV every 4 weeks.  First dose 07/03/2023.  Status post 8 cycles.   INTERVAL HISTORY: Marliss VEAR Daring 79 y.o. female returns to the clinic for a follow up visit accompanied by her daughter. The patient is feeling well today without any concerning complaints except for *** breast pain. I ordered ultrasound already   She denies fevers. She denies falls or heavy lifting. She denies injuries. She takes oxycodone  for back pain which helps the intermittent right lateral breast pain. She denies changes in her breathing. She denies shortness of breath or cough.    Otherwise, she is feeling well and tolerates her immunotherapy well. Denies any chills, night sweats, or weight loss. She states her appetite is coming back.  Denies any nausea, vomiting, or diarrhea. She has occasional constipation but manages it with medication. Denies any headache or visual changes. Denies any rashes or skin changes. She recently had a restaging CT scan. The patient is here today for evaluation prior to starting cycle # 9   MEDICAL HISTORY: Past Medical History:  Diagnosis Date   AAA (abdominal aortic aneurysm)    a. s/p stent graft repair 01/2016.   Acute ischemic colitis 07/29/2010   Diagnosed June, 2012 characterized by acute lower GI bleeding, spontaneously resolved.     Acute respiratory infection 06/09/2014   Acute sinus infection 05/14/2015   Allergic rhinitis, cause unspecified    Angioedema of lips 06/03/2014   Anxiety state, unspecified    Arthritis of left hip 04/16/2015   Bilateral hearing loss 07/19/2017   Chronic LBP    Coronary artery disease    a. inferior MI 1998 s/p PCI of RCA. b. stenting of Cx 04/2010. c. DES to prox LAD 05/2013. d. DES x 3 in 10/2015. // Myoview  07/2018:  EF 59, normal perfusion, low risk    Coronary artery disease involving native heart without angina pectoris 07/31/2006   Qualifier: Diagnosis of  By: Harlow MD, Ozell BRAVO  ANGIOGRAPHIC DATA:  1. Ventriculography done in the RAO projection reveals a small wall  motion abnormality in the mid inferior wall. Ejection fraction  would be estimated at around 50%.  2. The right coronary artery has some progressive disease of about 60-  80% in the proximal mid segment. The distal vessel appears to be  50-70% narrowing althou   Degeneration of lumbar or lumbosacral intervertebral disc    Depressive disorder, not elsewhere classified    Diabetes (HCC) 11/03/2006   Qualifier: Diagnosis of  By: [name] MD, Lynwood ORN    Diabetes mellitus    TYPE II   Frequent urination 05/08/2014   Gout 08/22/2013   History of endovascular stent graft for abdominal aortic aneurysm (AAA) 01/22/2016   History of radiation therapy    Right lung-04/19/23-05/29/23-Dr. [name]   Hyperlipidemia    Hypertension    Hypothyroidism    Iron  deficiency  anemia 06/13/2017   Lower GI bleed 06/2010   Diverticular bleed   Lumbar radiculopathy 05/15/2015   MENOPAUSAL DISORDER 10/29/2007   Qualifier: Diagnosis of  By: [name] MD, Lynwood ORN    Myalgia 09/25/2013   Noncompliance with medications 02/26/2014   Obesity, unspecified    OTITIS MEDIA, SEROUS, CHRONIC 04/23/2007   Qualifier: Diagnosis of  By: Harlow MD, Ozell BRAVO    Thoracic disc disease 11/19/2018   URTICARIA 09/23/2009   Qualifier: Diagnosis of  By: Inocencio  MD, Berwyn A    Venous insufficiency 07/19/2017    ALLERGIES:  is allergic to crestor  [rosuvastatin  calcium ], prilosec [omeprazole ], miconazole nitrate, augmentin  [amoxicillin -pot clavulanate], and doxycycline .  MEDICATIONS:  Current Outpatient Medications  Medication Sig Dispense Refill   alendronate  (FOSAMAX ) 70 MG tablet Take 1 tablet (70 mg total) by mouth every 7 (seven) days. Take with a full glass of water on an empty stomach. 12 tablet 3   aspirin  EC 81 MG tablet Take 1 tablet (81 mg total) by mouth daily. 100 tablet 3   atenolol  (TENORMIN ) 25 MG tablet TAKE 1/2 TABLET(12.5 MG TOTAL) BY MOUTH DAILY AT 9:00 AM. THIS IS A DECREASE IN DOSE 45 tablet 3   Blood Pressure Monitoring (OMRON 3 SERIES BP MONITOR) DEVI Use as directed. 1 each 0   Foot Care Products ([employer] GEL HEEL CUSHION WOMENS) PADS Use as directed at bedtime 2 each 5   gabapentin  (NEURONTIN ) 300 MG capsule Take 1-2 tab by mouth at bedtime for pain and sleep 180 capsule 1   levofloxacin  (LEVAQUIN ) 500 MG tablet Take 1 tablet (500 mg total) by mouth daily. 10 tablet 0   lidocaine -prilocaine  (EMLA ) cream Apply 1 Application topically as needed. 30 g 2   Oxycodone  HCl 10 MG TABS Take 1 tablet (10 mg total) by mouth every 4 (four) hours as needed. 30 tablet 0   polyethylene glycol powder (GLYCOLAX /MIRALAX ) 17 GM/SCOOP powder Take 17 g by mouth 2 (two) times daily as needed. 3350 g 1   predniSONE  (DELTASONE ) 10 MG tablet 3 tabs by mouth per day for 3 days,2tabs per day for 3 days,1tab per day for 3 days 18 tablet 0   prochlorperazine  (COMPAZINE ) 10 MG tablet Take 1 tablet (10 mg total) by mouth every 6 (six) hours as needed. 30 tablet 2   SYNTHROID  150 MCG tablet Take 1 tablet (150 mcg total) by mouth daily before breakfast. 90 tablet 3   temazepam  (RESTORIL ) 15 MG capsule Take 1 capsule (15 mg total) by mouth at bedtime as needed for sleep. 30 capsule 5   Triamcinolone  Acetonide (ZILRETTA ) 32 MG SRER intra-articular injection 5 mL,  intra-articular, bilat knees 2 each 0   No current facility-administered medications for this visit.    SURGICAL HISTORY:  Past Surgical History:  Procedure Laterality Date   CARDIAC CATHETERIZATION     PCI OF BOTH THE CIRCUMFLEX AND LEFT ANTERIOR DESCENDING ARTERY   CARDIAC CATHETERIZATION N/A 10/29/2015   Procedure: Coronary Stent Intervention;  Surgeon: Lonni Hanson, MD;  Location: [facility] INVASIVE CV LAB;  Service: Cardiovascular;  Laterality: N/A;   CARDIAC CATHETERIZATION N/A 10/29/2015   Procedure: Coronary/Graft Angiography;  Surgeon: Lonni Hanson, MD;  Location: [facility] INVASIVE CV LAB;  Service: Cardiovascular;  Laterality: N/A;   CARDIAC CATHETERIZATION N/A 10/29/2015   Procedure: Intravascular Pressure Wire/FFR Study;  Surgeon: Lonni Hanson, MD;  Location: [facility] INVASIVE CV LAB;  Service: Cardiovascular;  Laterality: N/A;   CESAREAN SECTION     ENDOVASCULAR STENT INSERTION N/A 01/22/2016  Procedure: ABDOMINAL AORTIC ENDOVASCULAR STENT GRAFT INSERTION;  Surgeon: Krystal JULIANNA Doing, MD;  Location: [facility];  Service: Vascular;  Laterality: N/A;   HEART STENT  04-2010  and  Jun 07, 2013   X 3   IR EMBO ARTERIAL NOT HEMORR HEMANG INC GUIDE ROADMAPPING  07/17/2023   IR IMAGING GUIDED PORT INSERTION  05/17/2023   IR RADIOLOGIST EVAL & MGMT  06/21/2023   LEFT HEART CATH AND CORONARY ANGIOGRAPHY N/A 04/03/2019   Procedure: LEFT HEART CATH AND CORONARY ANGIOGRAPHY;  Surgeon: Mady Bruckner, MD;  Location: [facility] INVASIVE CV LAB;  Service: Cardiovascular;  Laterality: N/A;   LEFT HEART CATHETERIZATION WITH CORONARY ANGIOGRAM N/A 06/07/2013   Procedure: LEFT HEART CATHETERIZATION WITH CORONARY ANGIOGRAM;  Surgeon: Bruckner JONETTA Cash, MD;  Location: [facility] CATH LAB;  Service: Cardiovascular;  Laterality: N/A;   LEFT HEART CATHETERIZATION WITH CORONARY ANGIOGRAM N/A 02/25/2014   Procedure: LEFT HEART CATHETERIZATION WITH CORONARY ANGIOGRAM;  Surgeon: Debby DELENA Sor, MD;  Location: [facility] CATH LAB;  Service:  Cardiovascular;  Laterality: N/A;   LUMBAR FUSION  01/2007   DR. GUST...3-LEVEL WITH FIXATION   OOPHORECTOMY     BSO? pt.unsure   PARATHYROIDECTOMY     SPINE SURGERY     THYROIDECTOMY     TOTAL ABDOMINAL HYSTERECTOMY      REVIEW OF SYSTEMS:   Review of Systems  Constitutional: Negative for appetite change, chills, fatigue, fever and unexpected weight change.  HENT:   Negative for mouth sores, nosebleeds, sore throat and trouble swallowing.   Eyes: Negative for eye problems and icterus.  Respiratory: Negative for cough, hemoptysis, shortness of breath and wheezing.   Cardiovascular: Negative for chest pain and leg swelling.  Gastrointestinal: Negative for abdominal pain, constipation, diarrhea, nausea and vomiting.  Genitourinary: Negative for bladder incontinence, difficulty urinating, dysuria, frequency and hematuria.   Musculoskeletal: Negative for back pain, gait problem, neck pain and neck stiffness.  Skin: Negative for itching and rash.  Neurological: Negative for dizziness, extremity weakness, gait problem, headaches, light-headedness and seizures.  Hematological: Negative for adenopathy. Does not bruise/bleed easily.  Psychiatric/Behavioral: Negative for confusion, depression and sleep disturbance. The patient is not nervous/anxious.     PHYSICAL EXAMINATION:  There were no vitals taken for this visit.  ECOG PERFORMANCE STATUS: {CHL ONC ECOG [id number]  Physical Exam  Constitutional: Oriented to person, place, and time and well-developed, well-nourished, and in no distress. No distress.  HENT:  Head: Normocephalic and atraumatic.  Mouth/Throat: Oropharynx is clear and moist. No oropharyngeal exudate.  Eyes: Conjunctivae are normal. Right eye exhibits no discharge. Left eye exhibits no discharge. No scleral icterus.  Neck: Normal range of motion. Neck supple.  Cardiovascular: Normal rate, regular rhythm, normal heart sounds and intact distal pulses.    Pulmonary/Chest: Effort normal and breath sounds normal. No respiratory distress. No wheezes. No rales.  Abdominal: Soft. Bowel sounds are normal. Exhibits no distension and no mass. There is no tenderness.  Musculoskeletal: Normal range of motion. Exhibits no edema.  Lymphadenopathy:    No cervical adenopathy.  Neurological: Alert and oriented to person, place, and time. Exhibits normal muscle tone. Gait normal. Coordination normal.  Skin: Skin is warm and dry. No rash noted. Not diaphoretic. No erythema. No pallor.  Psychiatric: Mood, memory and judgment normal.  Vitals reviewed.  LABORATORY DATA: Lab Results  Component Value Date   WBC 5.5 01/15/2024   HGB 12.2 01/15/2024   HCT 37.6 01/15/2024   MCV 90.6 01/15/2024   PLT 276 01/15/2024  Chemistry      Component Value Date/Time   NA 142 01/15/2024 0950   NA 141 06/08/2021 1606   K 3.5 01/15/2024 0950   CL 105 01/15/2024 0950   CO2 29 01/15/2024 0950   BUN <5 (L) 01/15/2024 0950   BUN 4 (L) 06/08/2021 1606   CREATININE 0.59 01/15/2024 0950      Component Value Date/Time   CALCIUM  8.3 (L) 01/15/2024 0950   ALKPHOS 109 01/15/2024 0950   AST 14 (L) 01/15/2024 0950   ALT <5 01/15/2024 0950   BILITOT 0.5 01/15/2024 0950       RADIOGRAPHIC STUDIES:  CT Chest W Contrast Result Date: 01/28/2024 CLINICAL DATA:  Non-small-cell lung cancer.  Restaging. EXAM: CT CHEST WITH CONTRAST TECHNIQUE: Multidetector CT imaging of the chest was performed during intravenous contrast administration. RADIATION DOSE REDUCTION: This exam was performed according to the departmental dose-optimization program which includes automated exposure control, adjustment of the mA and/or kV according to patient size and/or use of iterative reconstruction technique. CONTRAST:  60mL OMNIPAQUE  IOHEXOL  350 MG/ML SOLN COMPARISON:  10/17/2023 FINDINGS: Cardiovascular: The heart size is normal. No substantial pericardial effusion. Coronary artery calcification  is evident. Mild atherosclerotic calcification is noted in the wall of the thoracic aorta. Left-sided Port-A-Cath tip is at the junction of the SVC and right atrium. Mediastinum/Nodes: 11 mm short axis right paratracheal node on 47/2 is similar to prior. No other mediastinal lymphadenopathy. There is no hilar lymphadenopathy. The esophagus has normal imaging features. There is no axillary lymphadenopathy. Lungs/Pleura: Cavitary peripheral/pleural lesion in the right upper lung shows no substantial interval change. Immediately adjacent to this finding is a new area of nodular consolidative opacity in the right upper lobe measuring 3.2 x 2.0 cm on image 25/6. This tracks centrally towards the hilum or another new nodular consolidative component measures 2.4 x 1.9 cm on image 38/6. These nodular components are included in the background regional area of increased interlobular septal thickening and ground-glass opacity involving the posterolateral right upper lung. No new suspicious pulmonary nodule or mass in the left lung. No left pleural effusion. Upper Abdomen: Stable left hepatic cyst. Scattered tiny hypodensities in the liver parenchyma are too small to characterize but are statistically most likely benign. No followup imaging is recommended. Cystic process adjacent to the gallbladder fundus is been incompletely visualized and may reflect the gallbladder fundus itself with some adenomyomatosis. Incomplete visualization of abdominal aortic stent graft. Diverticular disease noted in the visualized segments of the colon. Musculoskeletal: Extensive thoracolumbar fusion hardware. IMPRESSION: 1. New nodular consolidative opacities in the right upper lobe tracking centrally towards the hilum. These are included in the background regional area of increased interlobular septal thickening and ground-glass opacity involving the posterolateral right upper lung. Findings may well reflect sequelae of radiation therapy, but  continued close follow-up warranted to exclude recurrent/progressive disease. 2. Stable appearance of the cavitary peripheral/pleural lesion in the right upper lung. 3. Stable 11 mm short axis right paratracheal node. 4. Cystic process adjacent to the gallbladder fundus has been incompletely visualized and may reflect the gallbladder fundus itself with some adenomyomatosis. Right upper quadrant ultrasound recommended to further evaluate. 5.  Aortic Atherosclerosis (ICD10-I70.0). Electronically Signed   By: Camellia Candle M.D.   On: 01/28/2024 06:22   MM 3D DIAGNOSTIC MAMMOGRAM UNILATERAL RIGHT BREAST Result Date: 01/24/2024 CLINICAL DATA:  79 year old presenting with focal pain involving the UPPER OUTER QUADRANT of the RIGHT breast at posterior depth. EXAM: DIGITAL DIAGNOSTIC UNILATERAL RIGHT MAMMOGRAM WITH TOMOSYNTHESIS  AND CAD; ULTRASOUND RIGHT BREAST LIMITED TECHNIQUE: Right digital diagnostic mammography and breast tomosynthesis was performed. The images were evaluated with computer-aided detection. ; Targeted ultrasound examination of the right breast was performed COMPARISON:  Previous exam(s). ACR Breast Density Category a: The breasts are almost entirely fatty. FINDINGS: Full field CC and MLO views and a spot tangential view of the site of focal pain were obtained. No findings suspicious for malignancy. No mammographic abnormality in the UPPER OUTER QUADRANT at posterior depth in the area of focal pain. A normal-appearing intramammary lymph node is present anterior to the site of focal pain, stable over multiple prior mammograms. Targeted ultrasound is performed in the area of focal pain, demonstrating normal fibrofatty tissue at 9 o'clock 10 cm from nipple. No cyst, solid mass or abnormal acoustic shadowing is identified. On correlative physical examination, the patient describes point tenderness in the far outer breast; this point tenderness is at the site of the crossing serratus anterior muscle on  sonography, indicating that the pain is likely musculoskeletal. IMPRESSION: No mammographic or sonographic evidence of malignancy involving the RIGHT breast. RECOMMENDATION: Annual BILATERAL screening mammography which is due in March, 2026. I have discussed the findings and recommendations with the patient. If applicable, a reminder letter will be sent to the patient regarding the next appointment. BI-RADS CATEGORY  1: Negative. Electronically Signed   By: Debby Satterfield M.D.   On: 01/24/2024 11:22   US  LIMITED ULTRASOUND INCLUDING AXILLA RIGHT BREAST Result Date: 01/24/2024 CLINICAL DATA:  79 year old presenting with focal pain involving the UPPER OUTER QUADRANT of the RIGHT breast at posterior depth. EXAM: DIGITAL DIAGNOSTIC UNILATERAL RIGHT MAMMOGRAM WITH TOMOSYNTHESIS AND CAD; ULTRASOUND RIGHT BREAST LIMITED TECHNIQUE: Right digital diagnostic mammography and breast tomosynthesis was performed. The images were evaluated with computer-aided detection. ; Targeted ultrasound examination of the right breast was performed COMPARISON:  Previous exam(s). ACR Breast Density Category a: The breasts are almost entirely fatty. FINDINGS: Full field CC and MLO views and a spot tangential view of the site of focal pain were obtained. No findings suspicious for malignancy. No mammographic abnormality in the UPPER OUTER QUADRANT at posterior depth in the area of focal pain. A normal-appearing intramammary lymph node is present anterior to the site of focal pain, stable over multiple prior mammograms. Targeted ultrasound is performed in the area of focal pain, demonstrating normal fibrofatty tissue at 9 o'clock 10 cm from nipple. No cyst, solid mass or abnormal acoustic shadowing is identified. On correlative physical examination, the patient describes point tenderness in the far outer breast; this point tenderness is at the site of the crossing serratus anterior muscle on sonography, indicating that the pain is likely  musculoskeletal. IMPRESSION: No mammographic or sonographic evidence of malignancy involving the RIGHT breast. RECOMMENDATION: Annual BILATERAL screening mammography which is due in March, 2026. I have discussed the findings and recommendations with the patient. If applicable, a reminder letter will be sent to the patient regarding the next appointment. BI-RADS CATEGORY  1: Negative. Electronically Signed   By: Debby Satterfield M.D.   On: 01/24/2024 11:22     ASSESSMENT/PLAN:  This is a very pleasant 79 year old African-American female with Stage IIIB (T4, N2b, M0) non-small cell lung cancer, squamous cell carcinoma. She presented with large pleural-based peripheral right upper lobe lung mass measuring up to 8.2 cm in size with large right paratracheal node with low level hypermetabolic uptake.   She underwent a course of concurrent chemoradiation with weekly carboplatin  for AUC of  2 and paclitaxel  45 Mg/M2.  Status post 4 cycles.   The patient is currently undergoing consolidation immunotherapy with Imfinzi  1500 mg IV every 4 weeks. She is status post 8 cycles.   The patient was seen with Dr. Sherrod today.  Dr. Sherrod personally and independently reviewed the scan and discussed results with the patient today.  The scan showed ***.  Dr. Sherrod recommends ***   Labs were reviewed. Recommend she proceed with cycle #9 today as scheduled.    We will see her in 4 weeks before undergoing cycle #10.     She is scheduled to see palliative care while in the infusion room today.   The patient was advised to call immediately if she has any concerning symptoms in the interval. The patient voices understanding of current disease status and treatment options and is in agreement with the current care plan. All questions were answered. The patient knows to call the clinic with any problems, questions or concerns. We can certainly see the patient much sooner if necessary    No orders of the defined types  were placed in this encounter.    I spent {CHL ONC TIME VISIT - DTPQU:[id number]} counseling the patient face to face. The total time spent in the appointment was {CHL ONC TIME VISIT - DTPQU:[id number]}.  Payslie Mccaig L Adiba Fargnoli, PA-C 02/07/24

## 2024-02-08 ENCOUNTER — Ambulatory Visit: Admitting: Internal Medicine

## 2024-02-08 ENCOUNTER — Encounter: Payer: Self-pay | Admitting: Internal Medicine

## 2024-02-08 VITALS — BP 124/80 | HR 97 | Temp 98.3°F | Ht 66.0 in | Wt 173.0 lb

## 2024-02-08 DIAGNOSIS — E559 Vitamin D deficiency, unspecified: Secondary | ICD-10-CM

## 2024-02-08 DIAGNOSIS — I10 Essential (primary) hypertension: Secondary | ICD-10-CM

## 2024-02-08 DIAGNOSIS — J019 Acute sinusitis, unspecified: Secondary | ICD-10-CM | POA: Diagnosis not present

## 2024-02-08 MED ORDER — GUAIFENESIN ER 600 MG PO TB12: 1200.0000 mg | ORAL_TABLET | Freq: Two times a day (BID) | ORAL | 1 refills | Status: AC | PRN

## 2024-02-08 MED ORDER — SYNTHROID 150 MCG PO TABS: 150.0000 ug | ORAL_TABLET | Freq: Every day | ORAL | 3 refills | Status: AC

## 2024-02-08 MED ORDER — HYDROCODONE BIT-HOMATROP MBR 5-1.5 MG/5ML PO SOLN: 5.0000 mL | Freq: Four times a day (QID) | ORAL | 0 refills | Status: AC | PRN

## 2024-02-08 MED ORDER — AZITHROMYCIN 250 MG PO TABS: ORAL_TABLET | ORAL | 1 refills | Status: AC

## 2024-02-08 MED ORDER — KETOROLAC TROMETHAMINE 30 MG/ML IJ SOLN
30.0000 mg | Freq: Once | INTRAMUSCULAR | Status: AC
  Administered 2024-02-08: 30 mg via INTRAMUSCULAR

## 2024-02-08 NOTE — Patient Instructions (Addendum)
 You had the pain shot today (toradol  30 mg) for the head and ear pain  Please take all new medication as prescribed - the antibiotic, cough medicine and mucinex  (and we had to change to the Azithromycin  as your allergies say you have had stomach upset before with the doxycycline  we mentioned)  Please continue all other medications as before, and refills have been done if requested.  Please have the pharmacy call with any other refills you may need..  Please keep your appointments with your specialists as you may have planned

## 2024-02-08 NOTE — Assessment & Plan Note (Signed)
 Last vitamin D  Lab Results  Component Value Date   VD25OH <7.00 (L) 11/21/2023   Low, to start oral replacement

## 2024-02-08 NOTE — Progress Notes (Signed)
 Patient ID: [name], female   DOB: [birth year], 79 y.o.   MRN: [id number]        Chief Complaint: follow up sinusitis with right ear pain, htn,low vit d       HPI:  [name] is a 79 y.o. female  Here with 2-3 days acute onset fever, facial pain, pressure, headache, general weakness and malaise, and greenish d/c, with mild ST and cough and moderate to severe right ear pain, but pt denies chest pain, wheezing, increased sob or doe, orthopnea, PND, increased LE swelling, palpitations, dizziness or syncope.   Pt denies polydipsia, polyuria, or new focal neuro s/s.       Wt Readings from Last 3 Encounters:  02/08/24 173 lb (78.5 kg)  01/19/24 179 lb (81.2 kg)  01/15/24 168 lb (76.2 kg)   BP Readings from Last 3 Encounters:  02/08/24 124/80  01/19/24 120/68  01/15/24 116/72         Past Medical History:  Diagnosis Date   AAA (abdominal aortic aneurysm)    a. s/p stent graft repair 01/2016.   Acute ischemic colitis 07/29/2010   Diagnosed June, 2012 characterized by acute lower GI bleeding, spontaneously resolved.    Acute respiratory infection 06/09/2014   Acute sinus infection 05/14/2015   Allergic rhinitis, cause unspecified    Angioedema of lips 06/03/2014   Anxiety state, unspecified    Arthritis of left hip 04/16/2015   Bilateral hearing loss 07/19/2017   Chronic LBP    Coronary artery disease    a. inferior MI 1998 s/p PCI of RCA. b. stenting of Cx 04/2010. c. DES to prox LAD 05/2013. d. DES x 3 in 10/2015. // Myoview  07/2018:  EF 59, normal perfusion, low risk    Coronary artery disease involving native heart without angina pectoris 07/31/2006   Qualifier: Diagnosis of  By: Harlow MD, Ozell BRAVO  ANGIOGRAPHIC DATA:  1. Ventriculography done in the RAO projection reveals a small wall  motion abnormality in the mid inferior wall. Ejection fraction  would be estimated at around 50%.  2. The right coronary artery has some progressive disease of about 60-  80% in the proximal mid  segment. The distal vessel appears to be  50-70% narrowing althou   Degeneration of lumbar or lumbosacral intervertebral disc    Depressive disorder, not elsewhere classified    Diabetes (HCC) 11/03/2006   Qualifier: Diagnosis of  By: [name] MD, Lynwood ORN    Diabetes mellitus    TYPE II   Frequent urination 05/08/2014   Gout 08/22/2013   History of endovascular stent graft for abdominal aortic aneurysm (AAA) 01/22/2016   History of radiation therapy    Right lung-04/19/23-05/29/23-Dr. [name]   Hyperlipidemia    Hypertension    Hypothyroidism    Iron  deficiency anemia 06/13/2017   Lower GI bleed 06/2010   Diverticular bleed   Lumbar radiculopathy 05/15/2015   MENOPAUSAL DISORDER 10/29/2007   Qualifier: Diagnosis of  By: [name] MD, Lynwood ORN    Myalgia 09/25/2013   Noncompliance with medications 02/26/2014   Obesity, unspecified    OTITIS MEDIA, SEROUS, CHRONIC 04/23/2007   Qualifier: Diagnosis of  By: Harlow MD, Ozell BRAVO    Thoracic disc disease 11/19/2018   URTICARIA 09/23/2009   Qualifier: Diagnosis of  By: Inocencio MD, Berwyn A    Venous insufficiency 07/19/2017   Past Surgical History:  Procedure Laterality Date   CARDIAC CATHETERIZATION     PCI OF BOTH THE CIRCUMFLEX AND  LEFT ANTERIOR DESCENDING ARTERY   CARDIAC CATHETERIZATION N/A 10/29/2015   Procedure: Coronary Stent Intervention;  Surgeon: Lonni Hanson, MD;  Location: [facility] INVASIVE CV LAB;  Service: Cardiovascular;  Laterality: N/A;   CARDIAC CATHETERIZATION N/A 10/29/2015   Procedure: Coronary/Graft Angiography;  Surgeon: Lonni Hanson, MD;  Location: [facility] INVASIVE CV LAB;  Service: Cardiovascular;  Laterality: N/A;   CARDIAC CATHETERIZATION N/A 10/29/2015   Procedure: Intravascular Pressure Wire/FFR Study;  Surgeon: Lonni Hanson, MD;  Location: [facility] INVASIVE CV LAB;  Service: Cardiovascular;  Laterality: N/A;   CESAREAN SECTION     ENDOVASCULAR STENT INSERTION N/A 01/22/2016   Procedure: ABDOMINAL AORTIC  ENDOVASCULAR STENT GRAFT INSERTION;  Surgeon: Krystal JULIANNA Doing, MD;  Location: [facility] OR;  Service: Vascular;  Laterality: N/A;   HEART STENT  04-2010  and  Jun 07, 2013   X 3   IR EMBO ARTERIAL NOT HEMORR HEMANG INC GUIDE ROADMAPPING  07/17/2023   IR IMAGING GUIDED PORT INSERTION  05/17/2023   IR RADIOLOGIST EVAL & MGMT  06/21/2023   LEFT HEART CATH AND CORONARY ANGIOGRAPHY N/A 04/03/2019   Procedure: LEFT HEART CATH AND CORONARY ANGIOGRAPHY;  Surgeon: Hanson Lonni, MD;  Location: [facility] INVASIVE CV LAB;  Service: Cardiovascular;  Laterality: N/A;   LEFT HEART CATHETERIZATION WITH CORONARY ANGIOGRAM N/A 06/07/2013   Procedure: LEFT HEART CATHETERIZATION WITH CORONARY ANGIOGRAM;  Surgeon: Lonni JONETTA Cash, MD;  Location: [facility] CATH LAB;  Service: Cardiovascular;  Laterality: N/A;   LEFT HEART CATHETERIZATION WITH CORONARY ANGIOGRAM N/A 02/25/2014   Procedure: LEFT HEART CATHETERIZATION WITH CORONARY ANGIOGRAM;  Surgeon: Debby DELENA Sor, MD;  Location: [facility] CATH LAB;  Service: Cardiovascular;  Laterality: N/A;   LUMBAR FUSION  01/2007   DR. GUST...3-LEVEL WITH FIXATION   OOPHORECTOMY     BSO? pt.unsure   PARATHYROIDECTOMY     SPINE SURGERY     THYROIDECTOMY     TOTAL ABDOMINAL HYSTERECTOMY      reports that she quit smoking about 37 years ago. Her smoking use included cigarettes. She started smoking about 67 years ago. She has never used smokeless tobacco. She reports that she does not drink alcohol and does not use drugs. family history includes Arthritis in her maternal aunt; Breast cancer in her maternal aunt; Diabetes in her brother and maternal aunt; Heart attack (age of onset: 68) in her mother; Heart attack (age of onset: 30) in her father; Heart disease in her father and mother. Allergies[1] Medications Ordered Prior to Encounter[2]      ROS:  All others reviewed and negative.  Objective        PE:  BP 124/80 (BP Location: Left Arm, Patient Position: Sitting, Cuff Size: Normal)   Pulse 97   Temp  98.3 F (36.8 C) (Oral)   Ht 5' 6 (1.676 m)   Wt 173 lb (78.5 kg)   SpO2 98%   BMI 27.92 kg/m                 Constitutional: Pt appears mild ill               HENT: Head: NCAT.                Right Ear: External ear normal.                 Left Ear: External ear normal. Bilat tm's with mild erythema.  Max sinus areas mild tender.  Pharynx with mild erythema, no exudate  Eyes: . Pupils are equal, round, and reactive to light. Conjunctivae and EOM are normal               Nose: without d/c or deformity               Neck: Neck supple. Gross normal ROM               Cardiovascular: Normal rate and regular rhythm.                 Pulmonary/Chest: Effort normal and breath sounds without rales or wheezing.                              Neurological: Pt is alert. At baseline orientation, motor grossly intact               Skin: Skin is warm. No rashes, no other new lesions, LE edema - none               Psychiatric: Pt behavior is normal without agitation   Micro: none  Cardiac tracings I have personally interpreted today:  none  Pertinent Radiological findings (summarize): none   Lab Results  Component Value Date   WBC 5.5 01/15/2024   HGB 12.2 01/15/2024   HCT 37.6 01/15/2024   PLT 276 01/15/2024   GLUCOSE 88 01/15/2024   CHOL 161 11/21/2023   TRIG 97.0 11/21/2023   HDL 34.90 (L) 11/21/2023   LDLDIRECT 131.0 03/27/2015   LDLCALC 106 (H) 11/21/2023   ALT <5 01/15/2024   AST 14 (L) 01/15/2024   NA 142 01/15/2024   K 3.5 01/15/2024   CL 105 01/15/2024   CREATININE 0.59 01/15/2024   BUN <5 (L) 01/15/2024   CO2 29 01/15/2024   TSH 22.700 (H) 11/20/2023   INR 1.2 03/27/2023   HGBA1C 6.0 11/21/2023   MICROALBUR 2.0 (H) 11/06/2009   Assessment/Plan:  Jandi ZAYLIE GISLER is a 79 y.o. Black or African American [2] female with  has a past medical history of AAA (abdominal aortic aneurysm), Acute ischemic colitis (07/29/2010), Acute respiratory infection (06/09/2014),  Acute sinus infection (05/14/2015), Allergic rhinitis, cause unspecified, Angioedema of lips (06/03/2014), Anxiety state, unspecified, Arthritis of left hip (04/16/2015), Bilateral hearing loss (07/19/2017), Chronic LBP, Coronary artery disease, Coronary artery disease involving native heart without angina pectoris (07/31/2006), Degeneration of lumbar or lumbosacral intervertebral disc, Depressive disorder, not elsewhere classified, Diabetes (HCC) (11/03/2006), Diabetes mellitus, Frequent urination (05/08/2014), Gout (08/22/2013), History of endovascular stent graft for abdominal aortic aneurysm (AAA) (01/22/2016), History of radiation therapy, Hyperlipidemia, Hypertension, Hypothyroidism, Iron  deficiency anemia (06/13/2017), Lower GI bleed (06/2010), Lumbar radiculopathy (05/15/2015), MENOPAUSAL DISORDER (10/29/2007), Myalgia (09/25/2013), Noncompliance with medications (02/26/2014), Obesity, unspecified, OTITIS MEDIA, SEROUS, CHRONIC (04/23/2007), Thoracic disc disease (11/19/2018), URTICARIA (09/23/2009), and Venous insufficiency (07/19/2017).  Essential hypertension BP Readings from Last 3 Encounters:  02/08/24 124/80  01/19/24 120/68  01/15/24 116/72   Stable, pt to continue medical treatment tenormin  12.5 mg qd   Vitamin D  deficiency Last vitamin D  Lab Results  Component Value Date   VD25OH <7.00 (L) 11/21/2023   Low, to start oral replacement   Acute sinus infection Mild to mod, for antibx course doxycyline 100 bid, cough med prn, toradol  30 mg IM today for pain,  to f/u any worsening symptoms or concerns  Followup: Return if symptoms worsen or fail to improve.  Lynwood Rush, MD 02/08/2024 4:48 PM [facility]  [facility]     [1]  Allergies Allergen Reactions   Crestor  [Rosuvastatin  Calcium ] Other (See Comments)    Feeling poor   Prilosec [Omeprazole ] Other (See Comments)    Chest pain   Miconazole Nitrate Hives     REACTION: hives   Augmentin  [Amoxicillin -Pot Clavulanate] Hives, Itching and Rash    Has patient had a PCN reaction causing immediate rash, facial/tongue/throat swelling, SOB or lightheadedness with hypotension:unsure Has patient had a PCN reaction causing severe rash involving mucus membranes or skin necrosis:unsure Has patient had a PCN reaction that required hospitalization:No Has patient had a PCN reaction occurring within the last 10 years:NO If all of the above answers are NO, then may proceed with Cephalosporin use. Has patient had a PCN reaction causing immediate rash, facial/tongue/throat swelling, SOB or lightheadedness with hypotension:unsure Has patient had a PCN reaction causing severe rash involving mucus membranes or skin necrosis:unsure Has patient had a PCN reaction that required hospitalization:No Has patient had a PCN reaction occurring within the last 10 years:NO If all of the above answers are NO, then may proceed with Cephalosporin use.    Doxycycline  Other (See Comments)    REACTION: gi upset  [2]  Current Outpatient Medications on File Prior to Visit  Medication Sig Dispense Refill   alendronate  (FOSAMAX ) 70 MG tablet Take 1 tablet (70 mg total) by mouth every 7 (seven) days. Take with a full glass of water on an empty stomach. 12 tablet 3   aspirin  EC 81 MG tablet Take 1 tablet (81 mg total) by mouth daily. 100 tablet 3   atenolol  (TENORMIN ) 25 MG tablet TAKE 1/2 TABLET(12.5 MG TOTAL) BY MOUTH DAILY AT 9:00 AM. THIS IS A DECREASE IN DOSE 45 tablet 3   Blood Pressure Monitoring (OMRON 3 SERIES BP MONITOR) DEVI Use as directed. 1 each 0   Foot Care Products ([employer] GEL HEEL CUSHION WOMENS) PADS Use as directed at bedtime 2 each 5   gabapentin  (NEURONTIN ) 300 MG capsule Take 1-2 tab by mouth at bedtime for pain and sleep 180 capsule 1   lidocaine -prilocaine  (EMLA ) cream Apply 1 Application topically as needed. 30 g 2   Oxycodone  HCl 10 MG TABS Take 1 tablet (10 mg  total) by mouth every 4 (four) hours as needed. 30 tablet 0   polyethylene glycol powder (GLYCOLAX /MIRALAX ) 17 GM/SCOOP powder Take 17 g by mouth 2 (two) times daily as needed. 3350 g 1   prochlorperazine  (COMPAZINE ) 10 MG tablet Take 1 tablet (10 mg total) by mouth every 6 (six) hours as needed. 30 tablet 2   temazepam  (RESTORIL ) 15 MG capsule Take 1 capsule (15 mg total) by mouth at bedtime as needed for sleep. 30 capsule 5   Triamcinolone  Acetonide (ZILRETTA ) 32 MG SRER intra-articular injection 5 mL, intra-articular, bilat knees 2 each 0   No current facility-administered medications on file prior to visit.

## 2024-02-08 NOTE — Assessment & Plan Note (Signed)
 Mild to mod, for antibx course doxycyline 100 bid, cough med prn, toradol  30 mg IM today for pain,  to f/u any worsening symptoms or concerns

## 2024-02-08 NOTE — Assessment & Plan Note (Signed)
 BP Readings from Last 3 Encounters:  02/08/24 124/80  01/19/24 120/68  01/15/24 116/72   Stable, pt to continue medical treatment tenormin  12.5 mg qd

## 2024-02-09 ENCOUNTER — Telehealth: Payer: Self-pay | Admitting: Physician Assistant

## 2024-02-09 NOTE — Telephone Encounter (Signed)
 I called the patient to let her know that the office will be closed due to the weather on Monday.  However we will keep her provider visit as planned at 10:00 via telephone visit.  The patient confirmed understanding.  I will call her at 10:00 on Monday for the provider visit and relay her change in infusion schedule to her at that time

## 2024-02-12 ENCOUNTER — Inpatient Hospital Stay

## 2024-02-12 ENCOUNTER — Inpatient Hospital Stay: Admitting: Nurse Practitioner

## 2024-02-12 ENCOUNTER — Inpatient Hospital Stay: Attending: Internal Medicine | Admitting: Physician Assistant

## 2024-02-12 ENCOUNTER — Telehealth: Payer: Self-pay | Admitting: Physician Assistant

## 2024-02-12 NOTE — Telephone Encounter (Signed)
 I attempted to call the patient 3x today for the telephone appointment. I was unable to reach her. I will reach out to the scheduling team to see if we can reschedule her in office prior to her infusion on Friday.

## 2024-02-13 NOTE — Progress Notes (Unsigned)
 [facility] OFFICE PROGRESS NOTE  Brandi Lynwood ORN, MD [address] [place] [address]  DIAGNOSIS:  Stage IIIB (T4, N2b, M0) non-small cell lung cancer, squamous cell carcinoma. She presented with large pleural-based peripheral right upper lobe lung mass measuring up to 8.2 cm in size with large right paratracheal node with low level hypermetabolic uptake.   PDL1: 15  PRIOR THERAPY: concurrent chemoradiation with carboplatin  for an AUC of 2 and taxol  45 mg/m2. First dose expected on 05/08/23. Status post 4 cycles. Last dose was given on 05/30/2023.   CURRENT THERAPY: Consolidation treatment with immunotherapy with Imfinzi  1500 Mg IV every 4 weeks.  First dose 07/03/2023.  Status post 8 cycles.   INTERVAL HISTORY: [name] 79 y.o. female returns to the clinic for a follow up visit accompanied by her daughter. The patient is feeling well today without any concerning complaints except for *** breast pain. I ordered ultrasound already   She denies fevers. She denies falls or heavy lifting. She denies injuries. She takes oxycodone  for back pain which helps the intermittent right lateral breast pain. She denies changes in her breathing. She denies shortness of breath or cough.    Otherwise, she is feeling well and tolerates her immunotherapy well. Denies any chills, night sweats, or weight loss. She states her appetite is coming back.  Denies any nausea, vomiting, or diarrhea. She has occasional constipation but manages it with medication. Denies any headache or visual changes. Denies any rashes or skin changes. She recently had a restaging CT scan. The patient is here today for evaluation prior to starting cycle # 9   MEDICAL HISTORY: Past Medical History:  Diagnosis Date   AAA (abdominal aortic aneurysm)    a. s/p stent graft repair 01/2016.   Acute ischemic colitis 07/29/2010   Diagnosed June, 2012 characterized by acute lower GI bleeding, spontaneously resolved.     Acute respiratory infection 06/09/2014   Acute sinus infection 05/14/2015   Allergic rhinitis, cause unspecified    Angioedema of lips 06/03/2014   Anxiety state, unspecified    Arthritis of left hip 04/16/2015   Bilateral hearing loss 07/19/2017   Chronic LBP    Coronary artery disease    a. inferior MI 1998 s/p PCI of RCA. b. stenting of Cx 04/2010. c. DES to prox LAD 05/2013. d. DES x 3 in 10/2015. // Myoview  07/2018:  EF 59, normal perfusion, low risk    Coronary artery disease involving native heart without angina pectoris 07/31/2006   Qualifier: Diagnosis of  By: Harlow MD, Ozell BRAVO  ANGIOGRAPHIC DATA:  1. Ventriculography done in the RAO projection reveals a small wall  motion abnormality in the mid inferior wall. Ejection fraction  would be estimated at around 50%.  2. The right coronary artery has some progressive disease of about 60-  80% in the proximal mid segment. The distal vessel appears to be  50-70% narrowing althou   Degeneration of lumbar or lumbosacral intervertebral disc    Depressive disorder, not elsewhere classified    Diabetes (HCC) 11/03/2006   Qualifier: Diagnosis of  By: [name] MD, Lynwood [name]    Diabetes mellitus    TYPE II   Frequent urination 05/08/2014   Gout 08/22/2013   History of endovascular stent graft for abdominal aortic aneurysm (AAA) 01/22/2016   History of radiation therapy    Right lung-04/19/23-05/29/23-Dr. [name]   Hyperlipidemia    Hypertension    Hypothyroidism    Iron  deficiency  anemia 06/13/2017   Lower GI bleed 06/2010   Diverticular bleed   Lumbar radiculopathy 05/15/2015   MENOPAUSAL DISORDER 10/29/2007   Qualifier: Diagnosis of  By: [name] MD, Lynwood [name]    Myalgia 09/25/2013   Noncompliance with medications 02/26/2014   Obesity, unspecified    OTITIS MEDIA, SEROUS, CHRONIC 04/23/2007   Qualifier: Diagnosis of  By: Harlow MD, Ozell BRAVO    Thoracic disc disease 11/19/2018   URTICARIA 09/23/2009   Qualifier: Diagnosis of  By: Inocencio  MD, Berwyn A    Venous insufficiency 07/19/2017    ALLERGIES:  is allergic to crestor  [rosuvastatin  calcium ], prilosec [omeprazole ], miconazole nitrate, augmentin  [amoxicillin -pot clavulanate], and doxycycline .  MEDICATIONS:  Current Outpatient Medications  Medication Sig Dispense Refill   alendronate  (FOSAMAX ) 70 MG tablet Take 1 tablet (70 mg total) by mouth every 7 (seven) days. Take with a full glass of water on an empty stomach. 12 tablet 3   aspirin  EC 81 MG tablet Take 1 tablet (81 mg total) by mouth daily. 100 tablet 3   atenolol  (TENORMIN ) 25 MG tablet TAKE 1/2 TABLET(12.5 MG TOTAL) BY MOUTH DAILY AT 9:00 AM. THIS IS A DECREASE IN DOSE 45 tablet 3   azithromycin  (ZITHROMAX ) 250 MG tablet Take 2 tablets on day 1, then 1 tablet daily on days 2 through 5 6 tablet 1   Blood Pressure Monitoring (OMRON 3 SERIES BP MONITOR) DEVI Use as directed. 1 each 0   Foot Care Products ([employer] GEL HEEL CUSHION WOMENS) PADS Use as directed at bedtime 2 each 5   gabapentin  (NEURONTIN ) 300 MG capsule Take 1-2 tab by mouth at bedtime for pain and sleep 180 capsule 1   guaiFENesin  (MUCINEX ) 600 MG 12 hr tablet Take 2 tablets (1,200 mg total) by mouth 2 (two) times daily as needed. 60 tablet 1   HYDROcodone  bit-homatropine (HYCODAN) 5-1.5 MG/5ML syrup Take 5 mLs by mouth every 6 (six) hours as needed for up to 10 days. 180 mL 0   lidocaine -prilocaine  (EMLA ) cream Apply 1 Application topically as needed. 30 g 2   Oxycodone  HCl 10 MG TABS Take 1 tablet (10 mg total) by mouth every 4 (four) hours as needed. 30 tablet 0   polyethylene glycol powder (GLYCOLAX /MIRALAX ) 17 GM/SCOOP powder Take 17 g by mouth 2 (two) times daily as needed. 3350 g 1   prochlorperazine  (COMPAZINE ) 10 MG tablet Take 1 tablet (10 mg total) by mouth every 6 (six) hours as needed. 30 tablet 2   SYNTHROID  150 MCG tablet Take 1 tablet (150 mcg total) by mouth daily before breakfast. 90 tablet 3   temazepam  (RESTORIL ) 15 MG capsule Take 1  capsule (15 mg total) by mouth at bedtime as needed for sleep. 30 capsule 5   Triamcinolone  Acetonide (ZILRETTA ) 32 MG SRER intra-articular injection 5 mL, intra-articular, bilat knees 2 each 0   No current facility-administered medications for this visit.    SURGICAL HISTORY:  Past Surgical History:  Procedure Laterality Date   CARDIAC CATHETERIZATION     PCI OF BOTH THE CIRCUMFLEX AND LEFT ANTERIOR DESCENDING ARTERY   CARDIAC CATHETERIZATION N/A 10/29/2015   Procedure: Coronary Stent Intervention;  Surgeon: Lonni Hanson, MD;  Location: [facility] INVASIVE CV LAB;  Service: Cardiovascular;  Laterality: N/A;   CARDIAC CATHETERIZATION N/A 10/29/2015   Procedure: Coronary/Graft Angiography;  Surgeon: Lonni Hanson, MD;  Location: [facility] INVASIVE CV LAB;  Service: Cardiovascular;  Laterality: N/A;   CARDIAC CATHETERIZATION N/A 10/29/2015   Procedure: Intravascular Pressure Wire/FFR Study;  Surgeon: Lonni Hanson, MD;  Location: [facility] INVASIVE CV LAB;  Service: Cardiovascular;  Laterality: N/A;   CESAREAN SECTION     ENDOVASCULAR STENT INSERTION N/A 01/22/2016   Procedure: ABDOMINAL AORTIC ENDOVASCULAR STENT GRAFT INSERTION;  Surgeon: Krystal JULIANNA Doing, MD;  Location: [facility] OR;  Service: Vascular;  Laterality: N/A;   HEART STENT  04-2010  and  Jun 07, 2013   X 3   IR EMBO ARTERIAL NOT HEMORR HEMANG INC GUIDE ROADMAPPING  07/17/2023   IR IMAGING GUIDED PORT INSERTION  05/17/2023   IR RADIOLOGIST EVAL & MGMT  06/21/2023   LEFT HEART CATH AND CORONARY ANGIOGRAPHY N/A 04/03/2019   Procedure: LEFT HEART CATH AND CORONARY ANGIOGRAPHY;  Surgeon: Hanson Lonni, MD;  Location: [facility] INVASIVE CV LAB;  Service: Cardiovascular;  Laterality: N/A;   LEFT HEART CATHETERIZATION WITH CORONARY ANGIOGRAM N/A 06/07/2013   Procedure: LEFT HEART CATHETERIZATION WITH CORONARY ANGIOGRAM;  Surgeon: Lonni JONETTA Cash, MD;  Location: [facility] CATH LAB;  Service: Cardiovascular;  Laterality: N/A;   LEFT HEART CATHETERIZATION WITH CORONARY  ANGIOGRAM N/A 02/25/2014   Procedure: LEFT HEART CATHETERIZATION WITH CORONARY ANGIOGRAM;  Surgeon: Debby DELENA Sor, MD;  Location: [facility] CATH LAB;  Service: Cardiovascular;  Laterality: N/A;   LUMBAR FUSION  01/2007   DR. GUST...3-LEVEL WITH FIXATION   OOPHORECTOMY     BSO? pt.unsure   PARATHYROIDECTOMY     SPINE SURGERY     THYROIDECTOMY     TOTAL ABDOMINAL HYSTERECTOMY      REVIEW OF SYSTEMS:   Review of Systems  Constitutional: Negative for appetite change, chills, fatigue, fever and unexpected weight change.  HENT:   Negative for mouth sores, nosebleeds, sore throat and trouble swallowing.   Eyes: Negative for eye problems and icterus.  Respiratory: Negative for cough, hemoptysis, shortness of breath and wheezing.   Cardiovascular: Negative for chest pain and leg swelling.  Gastrointestinal: Negative for abdominal pain, constipation, diarrhea, nausea and vomiting.  Genitourinary: Negative for bladder incontinence, difficulty urinating, dysuria, frequency and hematuria.   Musculoskeletal: Negative for back pain, gait problem, neck pain and neck stiffness.  Skin: Negative for itching and rash.  Neurological: Negative for dizziness, extremity weakness, gait problem, headaches, light-headedness and seizures.  Hematological: Negative for adenopathy. Does not bruise/bleed easily.  Psychiatric/Behavioral: Negative for confusion, depression and sleep disturbance. The patient is not nervous/anxious.     PHYSICAL EXAMINATION:  There were no vitals taken for this visit.  ECOG PERFORMANCE STATUS: {CHL ONC ECOG [id number]  Physical Exam  Constitutional: Oriented to person, place, and time and well-developed, well-nourished, and in no distress. No distress.  HENT:  Head: Normocephalic and atraumatic.  Mouth/Throat: Oropharynx is clear and moist. No oropharyngeal exudate.  Eyes: Conjunctivae are normal. Right eye exhibits no discharge. Left eye exhibits no discharge. No scleral icterus.   Neck: Normal range of motion. Neck supple.  Cardiovascular: Normal rate, regular rhythm, normal heart sounds and intact distal pulses.   Pulmonary/Chest: Effort normal and breath sounds normal. No respiratory distress. No wheezes. No rales.  Abdominal: Soft. Bowel sounds are normal. Exhibits no distension and no mass. There is no tenderness.  Musculoskeletal: Normal range of motion. Exhibits no edema.  Lymphadenopathy:    No cervical adenopathy.  Neurological: Alert and oriented to person, place, and time. Exhibits normal muscle tone. Gait normal. Coordination normal.  Skin: Skin is warm and dry. No rash noted. Not diaphoretic. No erythema. No pallor.  Psychiatric: Mood, memory and judgment normal.  Vitals reviewed.  LABORATORY DATA: Lab  Results  Component Value Date   WBC 5.5 01/15/2024   HGB 12.2 01/15/2024   HCT 37.6 01/15/2024   MCV 90.6 01/15/2024   PLT 276 01/15/2024      Chemistry      Component Value Date/Time   NA 142 01/15/2024 0950   NA 141 06/08/2021 1606   K 3.5 01/15/2024 0950   CL 105 01/15/2024 0950   CO2 29 01/15/2024 0950   BUN <5 (L) 01/15/2024 0950   BUN 4 (L) 06/08/2021 1606   CREATININE 0.59 01/15/2024 0950      Component Value Date/Time   CALCIUM  8.3 (L) 01/15/2024 0950   ALKPHOS 109 01/15/2024 0950   AST 14 (L) 01/15/2024 0950   ALT <5 01/15/2024 0950   BILITOT 0.5 01/15/2024 0950       RADIOGRAPHIC STUDIES:  CT Chest W Contrast Result Date: 01/28/2024 CLINICAL DATA:  Non-small-cell lung cancer.  Restaging. EXAM: CT CHEST WITH CONTRAST TECHNIQUE: Multidetector CT imaging of the chest was performed during intravenous contrast administration. RADIATION DOSE REDUCTION: This exam was performed according to the departmental dose-optimization program which includes automated exposure control, adjustment of the mA and/or kV according to patient size and/or use of iterative reconstruction technique. CONTRAST:  60mL OMNIPAQUE  IOHEXOL  350 MG/ML SOLN  COMPARISON:  10/17/2023 FINDINGS: Cardiovascular: The heart size is normal. No substantial pericardial effusion. Coronary artery calcification is evident. Mild atherosclerotic calcification is noted in the wall of the thoracic aorta. Left-sided Port-A-Cath tip is at the junction of the SVC and right atrium. Mediastinum/Nodes: 11 mm short axis right paratracheal node on 47/2 is similar to prior. No other mediastinal lymphadenopathy. There is no hilar lymphadenopathy. The esophagus has normal imaging features. There is no axillary lymphadenopathy. Lungs/Pleura: Cavitary peripheral/pleural lesion in the right upper lung shows no substantial interval change. Immediately adjacent to this finding is a new area of nodular consolidative opacity in the right upper lobe measuring 3.2 x 2.0 cm on image 25/6. This tracks centrally towards the hilum or another new nodular consolidative component measures 2.4 x 1.9 cm on image 38/6. These nodular components are included in the background regional area of increased interlobular septal thickening and ground-glass opacity involving the posterolateral right upper lung. No new suspicious pulmonary nodule or mass in the left lung. No left pleural effusion. Upper Abdomen: Stable left hepatic cyst. Scattered tiny hypodensities in the liver parenchyma are too small to characterize but are statistically most likely benign. No followup imaging is recommended. Cystic process adjacent to the gallbladder fundus is been incompletely visualized and may reflect the gallbladder fundus itself with some adenomyomatosis. Incomplete visualization of abdominal aortic stent graft. Diverticular disease noted in the visualized segments of the colon. Musculoskeletal: Extensive thoracolumbar fusion hardware. IMPRESSION: 1. New nodular consolidative opacities in the right upper lobe tracking centrally towards the hilum. These are included in the background regional area of increased interlobular septal  thickening and ground-glass opacity involving the posterolateral right upper lung. Findings may well reflect sequelae of radiation therapy, but continued close follow-up warranted to exclude recurrent/progressive disease. 2. Stable appearance of the cavitary peripheral/pleural lesion in the right upper lung. 3. Stable 11 mm short axis right paratracheal node. 4. Cystic process adjacent to the gallbladder fundus has been incompletely visualized and may reflect the gallbladder fundus itself with some adenomyomatosis. Right upper quadrant ultrasound recommended to further evaluate. 5.  Aortic Atherosclerosis (ICD10-I70.0). Electronically Signed   By: Camellia Candle M.D.   On: 01/28/2024 06:22   MM 3D DIAGNOSTIC  MAMMOGRAM UNILATERAL RIGHT BREAST Result Date: 01/24/2024 CLINICAL DATA:  79 year old presenting with focal pain involving the UPPER OUTER QUADRANT of the RIGHT breast at posterior depth. EXAM: DIGITAL DIAGNOSTIC UNILATERAL RIGHT MAMMOGRAM WITH TOMOSYNTHESIS AND CAD; ULTRASOUND RIGHT BREAST LIMITED TECHNIQUE: Right digital diagnostic mammography and breast tomosynthesis was performed. The images were evaluated with computer-aided detection. ; Targeted ultrasound examination of the right breast was performed COMPARISON:  Previous exam(s). ACR Breast Density Category a: The breasts are almost entirely fatty. FINDINGS: Full field CC and MLO views and a spot tangential view of the site of focal pain were obtained. No findings suspicious for malignancy. No mammographic abnormality in the UPPER OUTER QUADRANT at posterior depth in the area of focal pain. A normal-appearing intramammary lymph node is present anterior to the site of focal pain, stable over multiple prior mammograms. Targeted ultrasound is performed in the area of focal pain, demonstrating normal fibrofatty tissue at 9 o'clock 10 cm from nipple. No cyst, solid mass or abnormal acoustic shadowing is identified. On correlative physical examination, the  patient describes point tenderness in the far outer breast; this point tenderness is at the site of the crossing serratus anterior muscle on sonography, indicating that the pain is likely musculoskeletal. IMPRESSION: No mammographic or sonographic evidence of malignancy involving the RIGHT breast. RECOMMENDATION: Annual BILATERAL screening mammography which is due in March, 2026. I have discussed the findings and recommendations with the patient. If applicable, a reminder letter will be sent to the patient regarding the next appointment. BI-RADS CATEGORY  1: Negative. Electronically Signed   By: Debby Satterfield M.D.   On: 01/24/2024 11:22   US  LIMITED ULTRASOUND INCLUDING AXILLA RIGHT BREAST Result Date: 01/24/2024 CLINICAL DATA:  79 year old presenting with focal pain involving the UPPER OUTER QUADRANT of the RIGHT breast at posterior depth. EXAM: DIGITAL DIAGNOSTIC UNILATERAL RIGHT MAMMOGRAM WITH TOMOSYNTHESIS AND CAD; ULTRASOUND RIGHT BREAST LIMITED TECHNIQUE: Right digital diagnostic mammography and breast tomosynthesis was performed. The images were evaluated with computer-aided detection. ; Targeted ultrasound examination of the right breast was performed COMPARISON:  Previous exam(s). ACR Breast Density Category a: The breasts are almost entirely fatty. FINDINGS: Full field CC and MLO views and a spot tangential view of the site of focal pain were obtained. No findings suspicious for malignancy. No mammographic abnormality in the UPPER OUTER QUADRANT at posterior depth in the area of focal pain. A normal-appearing intramammary lymph node is present anterior to the site of focal pain, stable over multiple prior mammograms. Targeted ultrasound is performed in the area of focal pain, demonstrating normal fibrofatty tissue at 9 o'clock 10 cm from nipple. No cyst, solid mass or abnormal acoustic shadowing is identified. On correlative physical examination, the patient describes point tenderness in the far outer  breast; this point tenderness is at the site of the crossing serratus anterior muscle on sonography, indicating that the pain is likely musculoskeletal. IMPRESSION: No mammographic or sonographic evidence of malignancy involving the RIGHT breast. RECOMMENDATION: Annual BILATERAL screening mammography which is due in March, 2026. I have discussed the findings and recommendations with the patient. If applicable, a reminder letter will be sent to the patient regarding the next appointment. BI-RADS CATEGORY  1: Negative. Electronically Signed   By: Debby Satterfield M.D.   On: 01/24/2024 11:22     ASSESSMENT/PLAN:  This is a very pleasant 79 year old African-American female with Stage IIIB (T4, N2b, M0) non-small cell lung cancer, squamous cell carcinoma. She presented with large pleural-based peripheral right upper  lobe lung mass measuring up to 8.2 cm in size with large right paratracheal node with low level hypermetabolic uptake.   She underwent a course of concurrent chemoradiation with weekly carboplatin  for AUC of 2 and paclitaxel  45 Mg/M2.  Status post 4 cycles.   The patient is currently undergoing consolidation immunotherapy with Imfinzi  1500 mg IV every 4 weeks. She is status post 8 cycles.   The patient was seen with Dr. Sherrod today.  Dr. Sherrod personally and independently reviewed the scan and discussed results with the patient today.  The scan showed ***.  Dr. Sherrod recommends ***   Labs were reviewed. Recommend she proceed with cycle #9 today as scheduled.    We will see her in 4 weeks before undergoing cycle #10.     She is scheduled to see palliative care while in the infusion room today.   The patient was advised to call immediately if she has any concerning symptoms in the interval. The patient voices understanding of current disease status and treatment options and is in agreement with the current care plan. All questions were answered. The patient knows to call the clinic  with any problems, questions or concerns. We can certainly see the patient much sooner if necessary    No orders of the defined types were placed in this encounter.    I spent {CHL ONC TIME VISIT - DTPQU:[id number]} counseling the patient face to face. The total time spent in the appointment was {CHL ONC TIME VISIT - DTPQU:[id number]}.  Vaniya Augspurger L Leyda Vanderwerf, PA-C 02/13/24

## 2024-02-15 ENCOUNTER — Inpatient Hospital Stay: Attending: Internal Medicine

## 2024-02-16 ENCOUNTER — Inpatient Hospital Stay

## 2024-02-16 ENCOUNTER — Inpatient Hospital Stay: Admitting: Physician Assistant

## 2024-02-21 ENCOUNTER — Other Ambulatory Visit: Payer: Self-pay | Admitting: Physician Assistant

## 2024-02-21 DIAGNOSIS — C3411 Malignant neoplasm of upper lobe, right bronchus or lung: Secondary | ICD-10-CM

## 2024-02-29 ENCOUNTER — Inpatient Hospital Stay

## 2024-02-29 ENCOUNTER — Inpatient Hospital Stay: Attending: Internal Medicine

## 2024-02-29 ENCOUNTER — Inpatient Hospital Stay: Admitting: Physician Assistant

## 2024-03-06 ENCOUNTER — Inpatient Hospital Stay

## 2024-03-11 ENCOUNTER — Inpatient Hospital Stay: Admitting: Internal Medicine

## 2024-03-11 ENCOUNTER — Inpatient Hospital Stay

## 2024-03-14 ENCOUNTER — Inpatient Hospital Stay

## 2024-03-14 ENCOUNTER — Inpatient Hospital Stay: Admitting: Physician Assistant

## 2024-03-18 ENCOUNTER — Ambulatory Visit: Admitting: Family Medicine

## 2024-03-28 ENCOUNTER — Inpatient Hospital Stay: Attending: Internal Medicine

## 2024-03-28 ENCOUNTER — Inpatient Hospital Stay: Admitting: Physician Assistant

## 2024-03-28 ENCOUNTER — Inpatient Hospital Stay
